# Patient Record
Sex: Male | Born: 1950
Health system: Southern US, Community
[De-identification: ages and names within clinical notes are randomized; demographics above are authoritative.]

## PROBLEM LIST (undated history)

## (undated) DIAGNOSIS — E860 Dehydration: Secondary | ICD-10-CM

## (undated) DIAGNOSIS — R000 Tachycardia, unspecified: Secondary | ICD-10-CM

## (undated) DIAGNOSIS — R634 Abnormal weight loss: Secondary | ICD-10-CM

## (undated) DIAGNOSIS — Z8673 Personal history of transient ischemic attack (TIA), and cerebral infarction without residual deficits: Secondary | ICD-10-CM

## (undated) DIAGNOSIS — Z9989 Dependence on other enabling machines and devices: Secondary | ICD-10-CM

## (undated) DIAGNOSIS — H269 Unspecified cataract: Secondary | ICD-10-CM

## (undated) DIAGNOSIS — H547 Unspecified visual loss: Secondary | ICD-10-CM

## (undated) DIAGNOSIS — I10 Essential (primary) hypertension: Secondary | ICD-10-CM

## (undated) DIAGNOSIS — I739 Peripheral vascular disease, unspecified: Secondary | ICD-10-CM

## (undated) DIAGNOSIS — I639 Cerebral infarction, unspecified: Secondary | ICD-10-CM

## (undated) DIAGNOSIS — D649 Anemia, unspecified: Secondary | ICD-10-CM

## (undated) DIAGNOSIS — D510 Vitamin B12 deficiency anemia due to intrinsic factor deficiency: Secondary | ICD-10-CM

## (undated) DIAGNOSIS — N189 Chronic kidney disease, unspecified: Secondary | ICD-10-CM

## (undated) DIAGNOSIS — H409 Unspecified glaucoma: Secondary | ICD-10-CM

## (undated) DIAGNOSIS — I1310 Hypertensive heart and chronic kidney disease without heart failure, with stage 1 through stage 4 chronic kidney disease, or unspecified chronic kidney disease: Secondary | ICD-10-CM

## (undated) DIAGNOSIS — D126 Benign neoplasm of colon, unspecified: Secondary | ICD-10-CM

## (undated) DIAGNOSIS — Z87898 Personal history of other specified conditions: Secondary | ICD-10-CM

## (undated) DIAGNOSIS — E119 Type 2 diabetes mellitus without complications: Secondary | ICD-10-CM

## (undated) DIAGNOSIS — Z9079 Acquired absence of other genital organ(s): Secondary | ICD-10-CM

## (undated) DIAGNOSIS — M1990 Unspecified osteoarthritis, unspecified site: Secondary | ICD-10-CM

## (undated) DIAGNOSIS — I214 Non-ST elevation (NSTEMI) myocardial infarction: Secondary | ICD-10-CM

## (undated) DIAGNOSIS — I509 Heart failure, unspecified: Secondary | ICD-10-CM

## (undated) DIAGNOSIS — H9190 Unspecified hearing loss, unspecified ear: Secondary | ICD-10-CM

## (undated) DIAGNOSIS — E785 Hyperlipidemia, unspecified: Secondary | ICD-10-CM

## (undated) HISTORY — DX: Unspecified cataract: H26.9

## (undated) HISTORY — DX: Benign neoplasm of colon, unspecified: D12.6

## (undated) HISTORY — PX: POLYPECTOMY: SHX149

## (undated) HISTORY — DX: Dehydration: E86.0

## (undated) HISTORY — PX: UPPER GASTROINTESTINAL ENDOSCOPY: SHX188

## (undated) HISTORY — DX: Unspecified glaucoma: H40.9

## (undated) HISTORY — PX: CATARACT EXTRACTION, BILATERAL: SHX1313

## (undated) HISTORY — DX: Unspecified osteoarthritis, unspecified site: M19.90

## (undated) HISTORY — PX: COLONOSCOPY: SHX174

## (undated) HISTORY — DX: Hypertensive heart and chronic kidney disease without heart failure, with stage 1 through stage 4 chronic kidney disease, or unspecified chronic kidney disease: I13.10

## (undated) HISTORY — DX: Anemia, unspecified: D64.9

## (undated) HISTORY — DX: Cerebral infarction, unspecified: I63.9

## (undated) HISTORY — DX: Acquired absence of other genital organ(s): Z90.79

## (undated) HISTORY — DX: Unspecified visual loss: H54.7

## (undated) HISTORY — DX: Non-ST elevation (NSTEMI) myocardial infarction: I21.4

## (undated) HISTORY — DX: Hyperlipidemia, unspecified: E78.5

## (undated) HISTORY — DX: Personal history of other specified conditions: Z87.898

## (undated) HISTORY — DX: Abnormal weight loss: R63.4

## (undated) HISTORY — DX: Tachycardia, unspecified: R00.0

## (undated) HISTORY — DX: Vitamin B12 deficiency anemia due to intrinsic factor deficiency: D51.0

---
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
## 2002-08-04 ENCOUNTER — Encounter: Payer: Self-pay | Admitting: Emergency Medicine

## 2002-08-05 ENCOUNTER — Encounter: Payer: Self-pay | Admitting: Emergency Medicine

## 2002-08-05 ENCOUNTER — Observation Stay (HOSPITAL_COMMUNITY): Admission: EM | Admit: 2002-08-05 | Discharge: 2002-08-06 | Payer: Self-pay | Admitting: Emergency Medicine

## 2002-08-20 ENCOUNTER — Encounter: Admission: RE | Admit: 2002-08-20 | Discharge: 2002-08-20 | Payer: Self-pay | Admitting: Family Medicine

## 2002-08-27 ENCOUNTER — Encounter: Admission: RE | Admit: 2002-08-27 | Discharge: 2002-08-27 | Payer: Self-pay | Admitting: Family Medicine

## 2002-10-14 ENCOUNTER — Encounter: Admission: RE | Admit: 2002-10-14 | Discharge: 2002-10-14 | Payer: Self-pay | Admitting: Family Medicine

## 2002-10-29 ENCOUNTER — Encounter: Admission: RE | Admit: 2002-10-29 | Discharge: 2002-10-29 | Payer: Self-pay | Admitting: Family Medicine

## 2002-11-03 ENCOUNTER — Encounter: Admission: RE | Admit: 2002-11-03 | Discharge: 2003-02-01 | Payer: Self-pay

## 2015-09-12 ENCOUNTER — Observation Stay (HOSPITAL_COMMUNITY): Payer: Self-pay

## 2015-09-12 ENCOUNTER — Inpatient Hospital Stay (HOSPITAL_COMMUNITY)
Admission: EM | Admit: 2015-09-12 | Discharge: 2015-09-14 | DRG: 066 | Disposition: A | Discharge status: Home / Self Care | Payer: Self-pay | Attending: Internal Medicine | Admitting: Internal Medicine

## 2015-09-12 ENCOUNTER — Emergency Department (HOSPITAL_COMMUNITY): Payer: Self-pay

## 2015-09-12 ENCOUNTER — Encounter (HOSPITAL_COMMUNITY): Payer: Self-pay | Admitting: Neurology

## 2015-09-12 DIAGNOSIS — R29700 NIHSS score 0: Secondary | ICD-10-CM | POA: Diagnosis present

## 2015-09-12 DIAGNOSIS — R2681 Unsteadiness on feet: Secondary | ICD-10-CM | POA: Diagnosis present

## 2015-09-12 DIAGNOSIS — I4510 Unspecified right bundle-branch block: Secondary | ICD-10-CM | POA: Diagnosis present

## 2015-09-12 DIAGNOSIS — I119 Hypertensive heart disease without heart failure: Secondary | ICD-10-CM | POA: Diagnosis present

## 2015-09-12 DIAGNOSIS — Z7982 Long term (current) use of aspirin: Secondary | ICD-10-CM

## 2015-09-12 DIAGNOSIS — R739 Hyperglycemia, unspecified: Secondary | ICD-10-CM

## 2015-09-12 DIAGNOSIS — I739 Peripheral vascular disease, unspecified: Secondary | ICD-10-CM | POA: Diagnosis present

## 2015-09-12 DIAGNOSIS — E7800 Pure hypercholesterolemia, unspecified: Secondary | ICD-10-CM

## 2015-09-12 DIAGNOSIS — I160 Hypertensive urgency: Secondary | ICD-10-CM | POA: Diagnosis present

## 2015-09-12 DIAGNOSIS — E860 Dehydration: Secondary | ICD-10-CM | POA: Diagnosis present

## 2015-09-12 DIAGNOSIS — R4781 Slurred speech: Secondary | ICD-10-CM | POA: Diagnosis present

## 2015-09-12 DIAGNOSIS — G459 Transient cerebral ischemic attack, unspecified: Secondary | ICD-10-CM

## 2015-09-12 DIAGNOSIS — Z23 Encounter for immunization: Secondary | ICD-10-CM

## 2015-09-12 DIAGNOSIS — H547 Unspecified visual loss: Secondary | ICD-10-CM | POA: Diagnosis present

## 2015-09-12 DIAGNOSIS — I10 Essential (primary) hypertension: Secondary | ICD-10-CM | POA: Diagnosis present

## 2015-09-12 DIAGNOSIS — G8324 Monoplegia of upper limb affecting left nondominant side: Secondary | ICD-10-CM | POA: Diagnosis present

## 2015-09-12 DIAGNOSIS — I639 Cerebral infarction, unspecified: Principal | ICD-10-CM | POA: Diagnosis present

## 2015-09-12 DIAGNOSIS — Z8673 Personal history of transient ischemic attack (TIA), and cerebral infarction without residual deficits: Secondary | ICD-10-CM | POA: Diagnosis present

## 2015-09-12 DIAGNOSIS — E1165 Type 2 diabetes mellitus with hyperglycemia: Secondary | ICD-10-CM | POA: Diagnosis present

## 2015-09-12 DIAGNOSIS — E11319 Type 2 diabetes mellitus with unspecified diabetic retinopathy without macular edema: Secondary | ICD-10-CM | POA: Diagnosis present

## 2015-09-12 DIAGNOSIS — E785 Hyperlipidemia, unspecified: Secondary | ICD-10-CM | POA: Diagnosis present

## 2015-09-12 HISTORY — DX: Type 2 diabetes mellitus without complications: E11.9

## 2015-09-12 HISTORY — DX: Essential (primary) hypertension: I10

## 2015-09-12 LAB — CBC
HCT: 43 % (ref 39.0–52.0)
Hemoglobin: 15.8 g/dL (ref 13.0–17.0)
MCH: 30.8 pg (ref 26.0–34.0)
MCHC: 36.7 g/dL — ABNORMAL HIGH (ref 30.0–36.0)
MCV: 83.8 fL (ref 78.0–100.0)
Platelets: 294 10*3/uL (ref 150–400)
RBC: 5.13 MIL/uL (ref 4.22–5.81)
RDW: 12.7 % (ref 11.5–15.5)
WBC: 8.5 10*3/uL (ref 4.0–10.5)

## 2015-09-12 LAB — I-STAT CHEM 8, ED
BUN: 13 mg/dL (ref 6–20)
Calcium, Ion: 1.2 mmol/L (ref 1.13–1.30)
Chloride: 96 mmol/L — ABNORMAL LOW (ref 101–111)
Creatinine, Ser: 0.9 mg/dL (ref 0.61–1.24)
Glucose, Bld: 388 mg/dL — ABNORMAL HIGH (ref 65–99)
HCT: 49 % (ref 39.0–52.0)
Hemoglobin: 16.7 g/dL (ref 13.0–17.0)
Potassium: 3.8 mmol/L (ref 3.5–5.1)
Sodium: 136 mmol/L (ref 135–145)
TCO2: 27 mmol/L (ref 0–100)

## 2015-09-12 LAB — LIPID PANEL
Cholesterol: 269 mg/dL — ABNORMAL HIGH (ref 0–200)
HDL: 62 mg/dL (ref >40)
LDL Cholesterol: 178 mg/dL — ABNORMAL HIGH (ref 0–99)
Total CHOL/HDL Ratio: 4.3 RATIO
Triglycerides: 145 mg/dL (ref <150)
VLDL: 29 mg/dL (ref 0–40)

## 2015-09-12 LAB — GLUCOSE, CAPILLARY
Glucose-Capillary: 261 mg/dL — ABNORMAL HIGH (ref 65–99)
Glucose-Capillary: 211 mg/dL — ABNORMAL HIGH (ref 65–99)
Glucose-Capillary: 178 mg/dL — ABNORMAL HIGH (ref 65–99)

## 2015-09-12 LAB — CBG MONITORING, ED
Glucose-Capillary: 342 mg/dL — ABNORMAL HIGH (ref 65–99)
Glucose-Capillary: 355 mg/dL — ABNORMAL HIGH (ref 65–99)
Glucose-Capillary: 272 mg/dL — ABNORMAL HIGH (ref 65–99)

## 2015-09-12 LAB — COMPREHENSIVE METABOLIC PANEL
ALT: 10 U/L — ABNORMAL LOW (ref 17–63)
AST: 13 U/L — ABNORMAL LOW (ref 15–41)
Albumin: 3.2 g/dL — ABNORMAL LOW (ref 3.5–5.0)
Alkaline Phosphatase: 66 U/L (ref 38–126)
Anion gap: 13 (ref 5–15)
BUN: 9 mg/dL (ref 6–20)
CO2: 26 mmol/L (ref 22–32)
Calcium: 9.5 mg/dL (ref 8.9–10.3)
Chloride: 99 mmol/L — ABNORMAL LOW (ref 101–111)
Creatinine, Ser: 1.1 mg/dL (ref 0.61–1.24)
GFR calc Af Amer: >60 mL/min (ref >60)
GFR calc non Af Amer: >60 mL/min (ref >60)
Glucose, Bld: 402 mg/dL — ABNORMAL HIGH (ref 65–99)
Potassium: 3.9 mmol/L (ref 3.5–5.1)
Sodium: 138 mmol/L (ref 135–145)
Total Bilirubin: 1 mg/dL (ref 0.3–1.2)
Total Protein: 6.4 g/dL — ABNORMAL LOW (ref 6.5–8.1)

## 2015-09-12 LAB — DIFFERENTIAL
Basophils Absolute: 0 10*3/uL (ref 0.0–0.1)
Basophils Relative: 0 %
Eosinophils Absolute: 0 10*3/uL (ref 0.0–0.7)
Eosinophils Relative: 0 %
Lymphocytes Relative: 32 %
Lymphs Abs: 2.7 10*3/uL (ref 0.7–4.0)
Monocytes Absolute: 0.3 10*3/uL (ref 0.1–1.0)
Monocytes Relative: 3 %
Neutro Abs: 5.4 10*3/uL (ref 1.7–7.7)
Neutrophils Relative %: 64 %

## 2015-09-12 LAB — APTT: aPTT: 25 seconds (ref 24–37)

## 2015-09-12 LAB — PROTIME-INR
INR: 1.27 (ref 0.00–1.49)
Prothrombin Time: 16.1 seconds — ABNORMAL HIGH (ref 11.6–15.2)

## 2015-09-12 LAB — I-STAT TROPONIN, ED: Troponin i, poc: 0 ng/mL (ref 0.00–0.08)

## 2015-09-12 MED ORDER — PNEUMOCOCCAL VAC POLYVALENT 25 MCG/0.5ML IJ INJ
0.5000 mL | INJECTION | INTRAMUSCULAR | Status: AC
Start: 2015-09-13 — End: 2015-09-13
  Administered 2015-09-13: 0.5 mL via INTRAMUSCULAR
  Filled 2015-09-12: qty 0.5

## 2015-09-12 MED ORDER — LABETALOL HCL 5 MG/ML IV SOLN: 10.0000 mg | INTRAVENOUS | Status: DC | PRN

## 2015-09-12 MED ORDER — INSULIN ASPART 100 UNIT/ML ~~LOC~~ SOLN
0.0000 [IU] | Freq: Three times a day (TID) | SUBCUTANEOUS | Status: DC
  Administered 2015-09-13: 5 [IU] via SUBCUTANEOUS
  Administered 2015-09-13: 8 [IU] via SUBCUTANEOUS
  Administered 2015-09-13: 3 [IU] via SUBCUTANEOUS
  Administered 2015-09-14: 5 [IU] via SUBCUTANEOUS

## 2015-09-12 MED ORDER — INSULIN ASPART 100 UNIT/ML ~~LOC~~ SOLN
0.0000 [IU] | SUBCUTANEOUS | Status: DC
  Administered 2015-09-12: 8 [IU] via SUBCUTANEOUS
  Filled 2015-09-12: qty 1

## 2015-09-12 MED ORDER — STROKE: EARLY STAGES OF RECOVERY BOOK
Freq: Once | Status: AC
  Administered 2015-09-13: 1

## 2015-09-12 MED ORDER — ASPIRIN 81 MG PO CHEW
81.0000 mg | CHEWABLE_TABLET | Freq: Every day | ORAL | Status: DC
  Administered 2015-09-12 – 2015-09-14 (×3): 81 mg via ORAL
  Filled 2015-09-12 (×3): qty 1

## 2015-09-12 MED ORDER — SENNOSIDES-DOCUSATE SODIUM 8.6-50 MG PO TABS
1.0000 | ORAL_TABLET | Freq: Every evening | ORAL | Status: DC | PRN
Start: 2015-09-12 — End: 2015-09-14

## 2015-09-12 MED ORDER — LABETALOL HCL 5 MG/ML IV SOLN
20.0000 mg | Freq: Once | INTRAVENOUS | Status: AC
  Administered 2015-09-12: 20 mg via INTRAVENOUS

## 2015-09-12 MED ORDER — INFLUENZA VAC SPLIT QUAD 0.5 ML IM SUSY
0.5000 mL | PREFILLED_SYRINGE | INTRAMUSCULAR | Status: AC
  Administered 2015-09-13: 0.5 mL via INTRAMUSCULAR
  Filled 2015-09-12: qty 0.5

## 2015-09-12 MED ORDER — INSULIN ASPART 100 UNIT/ML ~~LOC~~ SOLN: 0.0000 [IU] | Freq: Every day | SUBCUTANEOUS | Status: DC

## 2015-09-12 MED ORDER — ACETAMINOPHEN 325 MG PO TABS: 650.0000 mg | ORAL_TABLET | Freq: Four times a day (QID) | ORAL | Status: DC | PRN

## 2015-09-12 MED ORDER — ENOXAPARIN SODIUM 40 MG/0.4ML ~~LOC~~ SOLN
40.0000 mg | SUBCUTANEOUS | Status: DC
  Administered 2015-09-12 – 2015-09-13 (×2): 40 mg via SUBCUTANEOUS
  Filled 2015-09-12 (×2): qty 0.4

## 2015-09-12 MED ORDER — SODIUM CHLORIDE 0.9 % IV BOLUS (SEPSIS)
1000.0000 mL | Freq: Once | INTRAVENOUS | Status: AC
  Administered 2015-09-12: 1000 mL via INTRAVENOUS

## 2015-09-12 NOTE — ED Notes (Signed)
Pt's CBG result was 342. Informed Hassan Rowan - RN.

## 2015-09-12 NOTE — Consult Note (Signed)
Requesting Physician: ED MD    Chief Complaint: Code stroke  HPI:                                                                                                                                         Dennis Macias is an 65 y.o. male who has Hx DM and was with his family. At 1310 he noted sudden onset of difficulty forming his words and some imbalance.  HE was brought to [facility] as code stroke. BP 230/120 but symptoms had resolved. EKG showing possible ST elevation. CT head negative for stroke. Patient placed on TIA alert and given 20 mg Labetalol, his blood pressure in the ED was 223/107. He does have decreased vision in his right eye but this is old. He did not experience focal weakness no numbness involving face or extremities.  Date last known well: Today Time last known well: Time: 13:10 tPA Given: No: symptoms resolved    Past Medical History  Diagnosis Date  . Diabetes ([place])     No past surgical history on file.  Family History  Problem Relation Age of Onset  . Hypertension Mother   . Hyperlipidemia Mother   . Hyperlipidemia Father   . Hyperlipidemia Father    Social History:  has no tobacco, alcohol, and drug history on file.  Allergies: Allergies not on file  Medications:                                                                                                                           No current facility-administered medications for this encounter.   No current outpatient prescriptions on file.     ROS:  History obtained from the patient  General ROS: negative for - chills, fatigue, fever, night sweats, weight gain or weight loss Psychological ROS: negative for - behavioral disorder, hallucinations, memory difficulties, mood swings or suicidal ideation Ophthalmic ROS: negative for - blurry vision, double vision, eye pain or loss of  vision ENT ROS: negative for - epistaxis, nasal discharge, oral lesions, sore throat, tinnitus or vertigo Allergy and Immunology ROS: negative for - hives or itchy/watery eyes Hematological and Lymphatic ROS: negative for - bleeding problems, bruising or swollen lymph nodes Endocrine ROS: negative for - galactorrhea, hair pattern changes, polydipsia/polyuria or temperature intolerance Respiratory ROS: negative for - cough, hemoptysis, shortness of breath or wheezing Cardiovascular ROS: negative for - chest pain, dyspnea on exertion, edema or irregular heartbeat Gastrointestinal ROS: negative for - abdominal pain, diarrhea, hematemesis, nausea/vomiting or stool incontinence Genito-Urinary ROS: negative for - dysuria, hematuria, incontinence or urinary frequency/urgency Musculoskeletal ROS: negative for - joint swelling or muscular weakness Neurological ROS: as noted in HPI Dermatological ROS: negative for rash and skin lesion changes  Neurologic Examination:                                                                                                      Blood pressure 233/107, pulse 106, temperature 98.6 F (37 C), temperature source Oral, resp. rate 12, height 6' (1.829 m), weight 80.8 kg (178 lb 2.1 oz), SpO2 99 %.  HEENT-  Normocephalic, no lesions, without obvious abnormality.  Normal external eye and conjunctiva.  Normal TM's bilaterally.  Normal auditory canals and external ears. Normal external nose, mucus membranes and septum.  Normal pharynx. Cardiovascular- S1, S2 normal, pulses palpable throughout   Lungs- chest clear, no wheezing, rales, normal symmetric air entry Abdomen- normal findings: bowel sounds normal Extremities- no edema Lymph-no adenopathy palpable Musculoskeletal-no joint tenderness, deformity or swelling Skin-warm and dry, no hyperpigmentation, vitiligo, or suspicious lesions  Neurological Examination Mental Status: Alert, oriented, thought content appropriate.   Speech fluent with mild slurring to dentulous, without evidence of aphasia.  Able to follow 3 step commands without difficulty. Cranial Nerves: II:  Visual fields grossly normal with decreased visual acuity in the right eye, pupils equal, round, reactive to light and accommodation III,IV, VI: ptosis not present, extra-ocular motions intact bilaterally V,VII: smile symmetric, facial light touch sensation normal bilaterally VIII: hearing normal bilaterally IX,X: uvula rises symmetrically XI: bilateral shoulder shrug XII: midline tongue extension Motor: Right : Upper extremity   5/5    Left:     Upper extremity   5/5  Lower extremity   5/5     Lower extremity   5/5 Tone and bulk:normal tone throughout; no atrophy noted Sensory: Pinprick and light touch intact throughout, bilaterally Deep Tendon Reflexes: 2+ and symmetric throughout Plantars: Right: downgoing   Left: downgoing Cerebellar: normal finger-to-nose,and normal heel-to-shin test Gait: not tested       Lab Results: Basic Metabolic Panel:  Recent Labs Lab 09/12/15 1356  NA 136  K 3.8  CL 96*  GLUCOSE 388*  BUN 13  CREATININE 0.90    Liver  Function Tests: No results for input(s): AST, ALT, ALKPHOS, BILITOT, PROT, ALBUMIN in the last 168 hours. No results for input(s): LIPASE, AMYLASE in the last 168 hours. No results for input(s): AMMONIA in the last 168 hours.  CBC:  Recent Labs Lab 09/12/15 1347 09/12/15 1356  WBC 8.5  --   NEUTROABS 5.4  --   HGB 15.8 16.7  HCT 43.0 49.0  MCV 83.8  --   PLT 294  --     Cardiac Enzymes: No results for input(s): CKTOTAL, CKMB, CKMBINDEX, TROPONINI in the last 168 hours.  Lipid Panel: No results for input(s): CHOL, TRIG, HDL, CHOLHDL, VLDL, LDLCALC in the last 168 hours.  CBG:  Recent Labs Lab 09/12/15 [place]*    Microbiology: No results found for this or any previous visit.  Coagulation Studies:  Recent Labs  09/12/15 1347  LABPROT 16.1*   INR 1.27    Imaging: Ct Head Wo Contrast  09/12/2015  CLINICAL DATA:  Slurred speech, unsteady gait EXAM: CT HEAD WITHOUT CONTRAST TECHNIQUE: Contiguous axial images were obtained from the base of the skull through the vertex without intravenous contrast. COMPARISON:  None. FINDINGS: No parenchymal hemorrhage or extra-axial fluid. Mild age-related atrophy and low attenuation in the deep white matter. New no evidence of mass or vascular territory infarct. No hydrocephalus. IMPRESSION: Mild age-related involutional change with no acute findings. Electronically Signed   By: Skipper Cliche M.D.   On: 09/12/2015 14:00    Etta Quill PA-C [facility] [id number]  09/12/2015, 2:11 PM   Assessment: 65 y.o. male with multiple risk factors for stroke presenting with probable transient ischemic attack manifested by transient expressive aphasia.  Stroke Risk Factors - diabetes mellitus and hypertension  Plan: 1. HgbA1c, fasting lipid panel 2. MRI, MRA  of the brain without contrast 3. PT consult, OT consult, Speech consult 4. Echocardiogram 5. Carotid dopplers 6. Prophylactic therapy-Antiplatelet med: Aspirin  7. Risk  Factor modification  I personally participated in this patient's evaluation and management, including formulating the above clinical assessment and management recommendations.  Rush Farmer M.D. [facility] [phone]

## 2015-09-12 NOTE — Progress Notes (Signed)
Pt arrived to 5M09 via stretcher.  Pt ambulated from stretcher to bathroom then to bed without difficulty.  Pt alert and oriented, conversant, daughter at bedside.  Telemetry applied and CCMD notified.  Will continue to monitor. Cori Razor, RN

## 2015-09-12 NOTE — H&P (Signed)
Date: 09/12/2015               Patient Name:  Dennis Macias MRN: [id number]  DOB: 1950/08/31 Age / Sex: 65 y.o., male   PCP: No primary care provider on file.         Medical Service: Internal Medicine Teaching Service         Attending Physician: Dr. Oval Linsey, MD    First Contact: Dr. Zada Finders Pager: [phone]  Second Contact: Dr. Jacques Earthly Pager: [phone]       After Hours (After 5p/  First Contact Pager: [phone]  weekends / holidays): Second Contact Pager: [phone]   Chief Complaint: Slurred speech  History of Present Illness:  Dennis Macias is a 65 year old male with PMH of T2DM who presents with slurred speech. Patient states he was at his usual state of health, playing with his grandkids, when he had sudden onset of slurred speech and difficulty getting his words out. His family reports seeing left sided facial droop at his mouth and weakness of his left upper extremity with inability to grab a can. He says he did not try to walk, so is unsure if he had ataxic gait. This began around 1 PM and lasted approximately 5-10 minutes before returning to his baseline health. He has not taken any medication for diabetes for several years now or have a regular physician. Patient denied any chest pain, palpitations, N/V/D/C, diaphoresis, change in vision, headache, SOB, fall, loss of consciousness, or seizure-like activity.  EMS were called and his BP was 180/100. In the ED his blood pressure was 230/120, CT head was negative for acute stroke or hemorrhage. He was given Labetalol 20 mg with subsequent BP of 181/99.   Meds: Current Facility-Administered Medications  Medication Dose Route Frequency Provider Last Rate Last Dose  .  stroke: mapping our early stages of recovery book   Does not apply Once Milagros Loll, MD      . acetaminophen (TYLENOL) tablet 650 mg  650 mg Oral Q6H PRN Milagros Loll, MD      . aspirin chewable tablet 81 mg  81 mg Oral Daily Milagros Loll, MD       . enoxaparin (LOVENOX) injection 40 mg  40 mg Subcutaneous Q24H Milagros Loll, MD      . Derrill Memo ON 09/13/2015] insulin aspart (novoLOG) injection 0-15 Units  0-15 Units Subcutaneous TID WC Milagros Loll, MD      . insulin aspart (novoLOG) injection 0-5 Units  0-5 Units Subcutaneous QHS Milagros Loll, MD      . labetalol (NORMODYNE,TRANDATE) injection 10 mg  10 mg Intravenous Q4H PRN Milagros Loll, MD      . senna-docusate (Senokot-S) tablet 1 tablet  1 tablet Oral QHS PRN Milagros Loll, MD        Allergies: Allergies as of 09/12/2015  . (No Known Allergies)   Past Medical History  Diagnosis Date  . Diabetes ([place])   . Hypertension    History reviewed. No pertinent past surgical history. Family History  Problem Relation Age of Onset  . Hypertension Mother   . Hyperlipidemia Mother   . Hyperlipidemia Father   . Hyperlipidemia Father    Social History   Social History  . Marital Status: Married    Spouse Name: N/A  . Number of Children: N/A  . Years of Education: N/A   Occupational History  . Not on file.   Social  History Main Topics  . Smoking status: Never Smoker   . Smokeless tobacco: Current User    Types: Chew  . Alcohol Use: 25.2 oz/week    42 Cans of beer per week     Comment: 1 6 pack per day  . Drug Use: No  . Sexual Activity: Not on file   Other Topics Concern  . Not on file   Social History Narrative  . No narrative on file    Review of Systems: Review of Systems  Constitutional: Negative for fever, chills and diaphoresis.  Eyes: Negative for blurred vision.  Respiratory: Negative for cough, shortness of breath and wheezing.   Cardiovascular: Negative for chest pain, palpitations and leg swelling.  Gastrointestinal: Negative for nausea, vomiting, abdominal pain, diarrhea, constipation and blood in stool.  Genitourinary: Negative for dysuria.  Musculoskeletal: Negative for myalgias, joint pain and falls.  Neurological: Positive for  speech change, focal weakness and weakness. Negative for dizziness, tingling, tremors, sensory change, seizures, loss of consciousness and headaches.  Psychiatric/Behavioral: Negative for substance abuse.     Physical Exam: Blood pressure 192/93, pulse 71, temperature 98.1 F (36.7 C), temperature source Oral, resp. rate 18, height 6' (1.829 m), weight 178 lb 2.1 oz (80.8 kg), SpO2 100 %. Physical Exam  Constitutional: He is oriented to person, place, and time. He appears well-developed and well-nourished. No distress.  HENT:  Head: Normocephalic and atraumatic.  Mouth/Throat: Oropharynx is clear and moist.  Eyes: EOM are normal. Pupils are equal, round, and reactive to light.  Cardiovascular: Normal rate, regular rhythm and intact distal pulses.  Exam reveals no gallop and no friction rub.   No murmur heard. Pulmonary/Chest: Effort normal. No respiratory distress. He has no wheezes. He has no rales.  Abdominal: Soft. Bowel sounds are normal. He exhibits no distension. There is no tenderness.  Musculoskeletal: Normal range of motion. He exhibits no edema or tenderness.  Neurological: He is alert and oriented to person, place, and time. He has normal strength. He displays no tremor. No cranial nerve deficit or sensory deficit. Coordination normal.  Reflex Scores:      Bicep reflexes are 2+ on the right side and 2+ on the left side.      Patellar reflexes are 2+ on the right side and 2+ on the left side. Skin: Skin is warm and dry. He is not diaphoretic.  Psychiatric: He has a normal mood and affect.     Lab results: Basic Metabolic Panel:  Recent Labs  09/12/15 1347 09/12/15 1356  NA 138 136  K 3.9 3.8  CL 99* 96*  CO2 26  --   GLUCOSE 402* 388*  BUN 9 13  CREATININE 1.10 0.90  CALCIUM 9.5  --    Liver Function Tests:  Recent Labs  09/12/15 1347  AST 13*  ALT 10*  ALKPHOS 66  BILITOT 1.0  PROT 6.4*  ALBUMIN 3.2*   No results for input(s): LIPASE, AMYLASE in the  last 72 hours. No results for input(s): AMMONIA in the last 72 hours. CBC:  Recent Labs  09/12/15 1347 09/12/15 1356  WBC 8.5  --   NEUTROABS 5.4  --   HGB 15.8 16.7  HCT 43.0 49.0  MCV 83.8  --   PLT 294  --    Cardiac Enzymes: No results for input(s): CKTOTAL, CKMB, CKMBINDEX, TROPONINI in the last 72 hours. BNP: No results for input(s): PROBNP in the last 72 hours. D-Dimer: No results for input(s): DDIMER in the  last 72 hours. CBG:  Recent Labs  09/12/15 1407 09/12/15 1626 09/12/15 1735 09/12/15 1821  GLUCAP 342* 355* 272* 261*   Hemoglobin A1C: No results for input(s): HGBA1C in the last 72 hours. Fasting Lipid Panel:  Recent Labs  09/12/15 1828  CHOL 269*  HDL 62  LDLCALC 178*  TRIG 145  CHOLHDL 4.3   Thyroid Function Tests: No results for input(s): TSH, T4TOTAL, FREET4, T3FREE, THYROIDAB in the last 72 hours. Anemia Panel: No results for input(s): VITAMINB12, FOLATE, FERRITIN, TIBC, IRON, RETICCTPCT in the last 72 hours. Coagulation:  Recent Labs  09/12/15 1347  LABPROT 16.1*  INR 1.27   Urine Drug Screen: Drugs of Abuse  No results found for: LABOPIA, COCAINSCRNUR, LABBENZ, AMPHETMU, THCU, LABBARB  Alcohol Level: No results for input(s): ETH in the last 72 hours. Urinalysis: No results for input(s): COLORURINE, LABSPEC, PHURINE, GLUCOSEU, HGBUR, BILIRUBINUR, KETONESUR, PROTEINUR, UROBILINOGEN, NITRITE, LEUKOCYTESUR in the last 72 hours.  Invalid input(s): APPERANCEUR   Imaging results:  Ct Head Wo Contrast  09/12/2015  ADDENDUM REPORT: 09/12/2015 18:03 ADDENDUM: Critical Value/emergent results were called by telephone at the time of interpretation on 09/12/2015 at 2:01 Pm to Dr. Nicole Kindred , who verbally acknowledged these results. Electronically Signed   By: Skipper Cliche M.D.   On: 09/12/2015 18:03  09/12/2015  CLINICAL DATA:  Slurred speech, unsteady gait EXAM: CT HEAD WITHOUT CONTRAST TECHNIQUE: Contiguous axial images were obtained from the  base of the skull through the vertex without intravenous contrast. COMPARISON:  None. FINDINGS: No parenchymal hemorrhage or extra-axial fluid. Mild age-related atrophy and low attenuation in the deep white matter. New no evidence of mass or vascular territory infarct. No hydrocephalus. IMPRESSION: Mild age-related involutional change with no acute findings. Electronically Signed: By: Skipper Cliche M.D. On: 09/12/2015 14:00    Other results: EKG: sinus tachycardia, rate 108, RBBB, possible ST elevation inferolateral leads  Assessment & Plan by Problem: Active Problems:   TIA (transient ischemic attack)   Hyperglycemia   Hypertensive urgency  TIA: Patient with slurred speech, left facial droop, and LUE weakness which resolved after 5-10 minutes. CT Head negative for acute changes. Patient likely had TIA in setting of uncontrolled diabetes and hypertension. Will need risk factor modification for secondary prevention and establishment with a PCP. -Neurology following -MRI/MRA brain without contrast -Permissive HTN up to 210/110 -Aspirin 81 mg -Check Hgb A1C, Lipid panel -TTE -Carotid dopplers -PT/OT -Stroke swallow screen  RBBB: Patient with apparent new RBBB compared to tracing in 2004. He denies any chest pain, palpitations, or syncope. -Repeat EKG   T2DM: Patient with history of diabetes, has not taken medication for several years. -SSI-M with HS coverage  HTN: Will need antihypertensive regimen -Permissive HTN as above for now   Diet: Heart  DVT ppx: Lovenox  Code: FULL    Dispo: Disposition is deferred at this time, awaiting improvement of current medical problems. Anticipated discharge in approximately 1-2 day(s).   The patient does not have a current PCP (No primary care provider on file.) and does need an [facility] hospital follow-up appointment after discharge.  The patient does not have transportation limitations that hinder transportation to clinic  appointments.  Signed: Zada Finders, MD 09/12/2015, 7:55 PM

## 2015-09-12 NOTE — ED Notes (Signed)
Dr Randell Patient paged and responded RE pt blood sugar and bp

## 2015-09-12 NOTE — Code Documentation (Signed)
65yo male arriving to [facility] via [facility] at 49.  EMS reports that the patient had sudden onset difficulty getting his words out at 1310.  EMS assessed slurred speech and unsteady gait and activated a code stroke.  Patient hypertensive at 180/100 and CBG 336.  Stroke team at the bedside on patient arrival.  Labs drawn and patient cleared for CT by Dr. Billy Fischer.  Patient to CT then to Trauma A.  NIHSS 0, see documentation for details and code stroke times.  Patient hypertensive with BP 250/137, Labetalol 20mg  IVP ordered.  Dr. Nicole Kindred at the bedside.  No acute stroke treatment at this time.  TIA alert.  Bedside handoff with ED RN Hassan Rowan.

## 2015-09-12 NOTE — ED Provider Notes (Signed)
CSN: [id number]     Arrival date & time 09/12/15  1345 History   First MD Initiated Contact with Patient 09/12/15 1347     Chief Complaint  Patient presents with  . Code Stroke     (Consider location/radiation/quality/duration/timing/severity/associated sxs/prior Treatment) HPI   Dennis Macias Is a 65 year old male with no known past medical history who does not follow regularly with the physician.At 1:30 PM he had the sudden onset of slurred speech and difficulty with walking. He was speaking with his daughter at the time that this occurred. They immediately called 911. The patient's symptoms resolved prior to arriving in the emergency department. He has a past medical history is of diabetes but does not take any medications or see a physician. 4. Upon arrival he was found to have an initial blood pressure of 180/100. Code stroke was initiated. CT scan of the brain is negative. He denies any chest pain, shortness of breath, diaphoresis. Patient was seen in shared visit with her neurologist, Dr. Nicole Kindred and his physician assistant. Patient current NIH scale of 0. He has an old right. I vision deficit, but no hemianopsia or changes in vision. Patient given IV labetalol. We will attempt to control his pressures. This appears to be a hypertensive emergency.Patient denies alcohol abuse.  No past medical history on file. No past surgical history on file. No family history on file. Social History  Substance Use Topics  . Smoking status: Not on file  . Smokeless tobacco: Not on file  . Alcohol Use: Not on file    Review of Systems  Ten systems reviewed and are negative for acute change, except as noted in the HPI.    Allergies  Review of patient's allergies indicates not on file.  Home Medications   Prior to Admission medications   Not on File   BP 250/137 mmHg  Pulse 99  Temp(Src) 98.6 F (37 C) (Oral)  Resp 14  Ht 6' (1.829 m)  Wt 80.8 kg  BMI 24.15 kg/m2  SpO2 98% Physical Exam   Constitutional: He appears well-developed and well-nourished. No distress.  HENT:  Head: Normocephalic and atraumatic.  Eyes: Conjunctivae are normal. No scleral icterus.  Neck: Normal range of motion. Neck supple.  Cardiovascular: Normal rate, regular rhythm and normal heart sounds.   Pulmonary/Chest: Effort normal and breath sounds normal. No respiratory distress.  Abdominal: Soft. There is no tenderness.  Musculoskeletal: He exhibits no edema.  Neurological: He is alert.  Speech is clear and goal oriented, follows commands Major Cranial nerves without deficit, no facial droop Normal strength in upper and lower extremities bilaterally including dorsiflexion and plantar flexion, strong and equal grip strength Sensation normal to light and sharp touch Moves extremities without ataxia, coordination intact Tremulous movements Normal finger to nose and rapid alternating movements Neg romberg, no pronator drift    Skin: Skin is warm and dry. He is not diaphoretic.  Psychiatric: His behavior is normal.  Nursing note and vitals reviewed.   ED Course  Procedures (including critical care time) Labs Review Labs Reviewed  CBC - Abnormal; Notable for the following:    MCHC 36.7 (*)    All other components within normal limits  CBG MONITORING, ED - Abnormal; Notable for the following:    Glucose-Capillary 342 (*)    All other components within normal limits  I-STAT CHEM 8, ED - Abnormal; Notable for the following:    Chloride 96 (*)    Glucose, Bld 388 (*)  All other components within normal limits  DIFFERENTIAL  PROTIME-INR  APTT  COMPREHENSIVE METABOLIC PANEL  I-STAT TROPOININ, ED    Imaging Review Ct Head Wo Contrast  09/12/2015  CLINICAL DATA:  Slurred speech, unsteady gait EXAM: CT HEAD WITHOUT CONTRAST TECHNIQUE: Contiguous axial images were obtained from the base of the skull through the vertex without intravenous contrast. COMPARISON:  None. FINDINGS: No parenchymal  hemorrhage or extra-axial fluid. Mild age-related atrophy and low attenuation in the deep white matter. New no evidence of mass or vascular territory infarct. No hydrocephalus. IMPRESSION: Mild age-related involutional change with no acute findings. Electronically Signed   By: Skipper Cliche M.D.   On: 09/12/2015 14:00   I have personally reviewed and evaluated these images and lab results as part of my medical decision-making.   EKG Interpretation None      MDM   Final diagnoses:  Hypertensive urgency  Transient cerebral ischemia, unspecified transient cerebral ischemia type  Hyperglycemia   3:34 PM Filed Vitals:   09/12/15 1430 09/12/15 1445 09/12/15 1500 09/12/15 1512  BP: 175/103 174/98 168/93   Pulse: 77 81 74   Temp:    98.6 F (37 C)  TempSrc:      Resp: 18 17 24    Height:      Weight:      SpO2: 98% 99% 97%     Patient with hyperglycemia. Hypertension improving with IV labetalol 20mg . He will be admitted by the internal medicine teaching service.  No return of TIA like sxs.  Neurology will consult.     Margarita Mail, PA-C 09/12/15 [place], MD 09/12/15 2159

## 2015-09-12 NOTE — ED Notes (Signed)
Pt LSN at 1310 by daughter when he suddenly began slurring his speech and stating he was having difficulty walking.  AO x 4.  CBG in 300's.  BP 180/100.

## 2015-09-13 ENCOUNTER — Ambulatory Visit (HOSPITAL_COMMUNITY): Payer: Self-pay

## 2015-09-13 DIAGNOSIS — Z8673 Personal history of transient ischemic attack (TIA), and cerebral infarction without residual deficits: Secondary | ICD-10-CM

## 2015-09-13 DIAGNOSIS — I639 Cerebral infarction, unspecified: Principal | ICD-10-CM

## 2015-09-13 DIAGNOSIS — G459 Transient cerebral ischemic attack, unspecified: Secondary | ICD-10-CM

## 2015-09-13 DIAGNOSIS — E785 Hyperlipidemia, unspecified: Secondary | ICD-10-CM | POA: Diagnosis present

## 2015-09-13 DIAGNOSIS — I10 Essential (primary) hypertension: Secondary | ICD-10-CM

## 2015-09-13 DIAGNOSIS — E1165 Type 2 diabetes mellitus with hyperglycemia: Secondary | ICD-10-CM

## 2015-09-13 DIAGNOSIS — I4510 Unspecified right bundle-branch block: Secondary | ICD-10-CM

## 2015-09-13 HISTORY — DX: Personal history of transient ischemic attack (TIA), and cerebral infarction without residual deficits: Z86.73

## 2015-09-13 LAB — LIPID PANEL
Cholesterol: 234 mg/dL — ABNORMAL HIGH (ref 0–200)
HDL: 49 mg/dL (ref >40)
LDL Cholesterol: 148 mg/dL — ABNORMAL HIGH (ref 0–99)
Total CHOL/HDL Ratio: 4.8 RATIO
Triglycerides: 183 mg/dL — ABNORMAL HIGH (ref <150)
VLDL: 37 mg/dL (ref 0–40)

## 2015-09-13 LAB — GLUCOSE, CAPILLARY
Glucose-Capillary: 223 mg/dL — ABNORMAL HIGH (ref 65–99)
Glucose-Capillary: 253 mg/dL — ABNORMAL HIGH (ref 65–99)
Glucose-Capillary: 173 mg/dL — ABNORMAL HIGH (ref 65–99)
Glucose-Capillary: 257 mg/dL — ABNORMAL HIGH (ref 65–99)

## 2015-09-13 LAB — HEMOGLOBIN A1C
Hgb A1c MFr Bld: 10.7 % — ABNORMAL HIGH (ref 4.8–5.6)
Mean Plasma Glucose: 260 mg/dL

## 2015-09-13 MED ORDER — ATORVASTATIN CALCIUM 80 MG PO TABS
80.0000 mg | ORAL_TABLET | Freq: Every day | ORAL | Status: DC
  Administered 2015-09-13: 80 mg via ORAL
  Filled 2015-09-13: qty 1

## 2015-09-13 NOTE — Progress Notes (Signed)
Inpatient Diabetes Program Recommendations  AACE/ADA: New Consensus Statement on Inpatient Glycemic Control (2015)  Target Ranges:  Prepandial:   less than 140 mg/dL      Peak postprandial:   less than 180 mg/dL (1-2 hours)      Critically ill patients:  140 - 180 mg/dL   Spoke with patient about diabetes. Patient reports being first diagnosed with DM 15 years ago. Patient lost a lot of weight and was able to control his glucose without medical intervention. Inquired about prior A1C knowledge. Patient reports it sounds familiar. Went over A1c goals and patient's current A1c (10.7% on 09/12/15) and explained that his current A1C indicates an average glucose of 250's mg/dl over the past 2-3 months. Also covered glucose goals. Discussed importance of checking CBGs and maintaining good CBG control to prevent long-term and short-term complications. Requested Patient check glucose 2 times a day on possible oral medications. Stressed to the patient the importance of improving glycemic control to prevent further complications from uncontrolled diabetes. Discussed impact of nutrition, exercise, stress, sickness, and medications on diabetes control. Discussed carbohydrates, carbohydrate goals per day and meal, along with portion sizes. Encouraged patient to keep a log book of glucose readings and insulin taken which he will need to take to doctor appointments. Explained how the doctor he follows up with can use the log book to continue to make insulin adjustments if needed. Spoke with patient about the need of him being on medication at this time with his A1c. Discussed s/s of hypoglycemia and treatment.  Patient verbalized understanding of information discussed and he states that he has no further questions at this time related to diabetes.  Thanks, Tama Headings RN, MSN, [facility] Inpatient Diabetes Coordinator Team Pager [phone] (8a-5p)

## 2015-09-13 NOTE — Progress Notes (Signed)
   Subjective: Patient feels back to his baseline, no events overnight.  Objective: Vital signs in last 24 hours: Filed Vitals:   09/13/15 0500 09/13/15 0558 09/13/15 1024 09/13/15 1451  BP:  209/101 188/100 156/98  Pulse:  76 80 79  Temp:  98.7 F (37.1 C)  98.3 F (36.8 C)  TempSrc:  Oral Oral Oral  Resp: 16 16 18 18   Height:      Weight:      SpO2: 99% 99% 99% 99%   Weight change:  No intake or output data in the 24 hours ending 09/13/15 1636 General: resting in bed, no acute distress HEENT: EOMI, no scleral icterus Cardiac: RRR, no rubs, murmurs or gallops Pulm: clear to auscultation bilaterally, moving normal volumes of air Abd: soft, nontender, nondistended, BS present Ext: warm and well perfused, no pedal edema Neuro: alert and oriented X3, cranial nerves II-XII grossly intact, mild dysmetria of left hand on finger to nose, otherwise neuro exam intact   Assessment/Plan: Principal Problem:   Acute CVA (cerebrovascular accident) ([place]) Active Problems:   Uncontrolled type 2 diabetes mellitus ([place])   Hypertensive urgency   Hyperlipidemia  Acute CVA: MRI of brain with two small acute infarcts right mid to posterior frontal lobe bordering the parietal lobe in a parasaggital distribution. Also showing small acute infarct of the anterior medial left frontal lobe. Possibly secondary to embolic process or small vessel disease. Patient's symptoms of left facial droop, slurred speech, and LUE weakness have resolved. Will need risk factor modification and control of hypertension, diabetes, hyperlipidemia. TTE read pending. -Neurology following -f/u TTE read -> TEE tomorrow if negative -Permissive HTN <220/120 -Continue Aspirin 81 mg -Atorvastatin 80 mg -Lifestyle modifications -f/u in [facility]  T2DM: Hgb A1C 10.7 -SSI-M -Add Metformin on discharge  HTN: Allow permissive hypertension <220/120 for now, then slowly titrate medical therapy for BP control  HLD: -Atorvastatin 80  mg  RBBB: Patient with apparent new RBBB compared to tracing in 2004. He is asymptomatic. -Continue to monitor  Dispo:  Anticipated discharge in approximately 1 day(s).    LOS: 0 days   Zada Finders, MD 09/13/2015, 4:36 PM

## 2015-09-13 NOTE — Evaluation (Addendum)
Occupational Therapy Evaluation Patient Details Name: Dennis Macias MRN: [id number] DOB: 10-Apr-1951 Today's Date: 09/13/2015    History of Present Illness 65 y.o. admitted for stroke like symptoms. MRI revealed Two small acute infarcts right mid to posterior frontal lobe (bordering the parietal lobe) in a parasagittal distribution. This raises possibility of watershed infarcts. Small acute infarct anterior medial left frontal lobe. PMH includes diabetes and hypertension   Clinical Impression   Pt admitted with above. Pt independent with ADLs, PTA. Feel pt will benefit from acute OT to address vision. Recommended pt get a full visual field assessment at eye doctor.     Follow Up Recommendations  Outpatient OT; full visual field assessment   Equipment Recommendations  None recommended by OT    Recommendations for Other Services       Precautions / Restrictions Precautions Precautions: None Restrictions Weight Bearing Restrictions: No      Mobility Bed Mobility Overal bed mobility: Modified Independent              Transfers Overall transfer level: Independent Equipment used: None                Balance Pt simulated LB bathing while standing and pt with LOB due to him holding to closet door that moved. Pt simulated holding to steady surface with no LOB.                                       ADL Overall ADL's : Needs assistance/impaired             Lower Body Bathing: Supervison/ safety (standing; initially held to closet door which moved; no LOB noted when pt held to sturdy surface)        Lower Body Dressing: Modified independent;Sit to/from stand   Toilet Transfer: Modified Independent;Ambulation (sit to stand from bed)           Functional mobility during ADLs: Modified independent (for ambulation) General ADL Comments: Recommended holding to shower wall when going to wash LB in shower.     Vision Pt wears reading glasses;  Reports blurry vision in right eye that has been going on for around a year.  Vision Assessment?: Yes Tracking/Visual Pursuits:  (decreased smoothness and pt losing pen at times) Visual Fields:  (inconsistent in bilateral superior fields)  *pt having difficulty keeping eyes on OT's nose   Perception     Praxis      Pertinent Vitals/Pain Pain Assessment: No/denies pain     Hand Dominance     Extremity/Trunk Assessment Upper Extremity Assessment Upper Extremity Assessment: Overall WFL for tasks assessed   Lower Extremity Assessment Lower Extremity Assessment: Defer to PT evaluation       Communication Communication Communication:  (unsure if HOH-seemed a little difficult to understand OT)   Cognition Arousal/Alertness: Awake/alert Behavior During Therapy: WFL for tasks assessed/performed Overall Cognitive Status:  (unsure of baseline-decreased visual attention)                     General Comments       Exercises       Shoulder Instructions      Home Living Family/patient expects to be discharged to:: Private residence Living Arrangements: Spouse/significant other Available Help at Discharge: Family;Available PRN/intermittently Type of Home: House Home Access: Stairs to enter [employer] of Steps: 3 Entrance Stairs-Rails: Left;Right Home Layout: One level  Bathroom Shower/Tub: Tub/shower unit         Home Equipment: None   Additional Comments: Patient reports that his wife does work.       Prior Functioning/Environment Level of Independence: Independent             OT Diagnosis: Disturbance of vision   OT Problem List: Decreased cognition;Impaired vision/perception   OT Treatment/Interventions: Therapeutic activities;Visual/perceptual remediation/compensation;Cognitive remediation/compensation;Patient/family education    OT Goals(Current goals can be found in the care plan section) Acute Rehab OT Goals Patient Stated  Goal: not stated OT Goal Formulation: With patient Time For Goal Achievement: 09/20/15 Potential to Achieve Goals: Good ADL Goals Additional ADL Goal #1: Pt will perform vision exercises/activities at modified independent level.  OT Frequency: Min 2X/week   Barriers to D/C:            Co-evaluation              End of Session    Activity Tolerance: Patient tolerated treatment well Patient left: in bed;with family/visitor present   Time: 1128-1150 OT Time Calculation (min): 22 min Charges:  OT General Charges $OT Visit: 1 Procedure OT Evaluation $OT Eval Low Complexity: 1 Procedure G-Codes: OT G-codes **NOT FOR INPATIENT CLASS** Functional Assessment Tool Used: clinical judgment Functional Limitation: Self care Self Care Current Status [id number]): At least 1 percent but less than 20 percent impaired, limited or restricted Self Care Goal Status [id number]): 0 percent impaired, limited or restricted  Benito Mccreedy OTR/L [id number] 09/13/2015, 1:30 PM

## 2015-09-13 NOTE — Progress Notes (Signed)
  Echocardiogram 2D Echocardiogram has been performed.  Dennis Macias 09/13/2015, 1:53 PM

## 2015-09-13 NOTE — Progress Notes (Signed)
VASCULAR LAB PRELIMINARY  PRELIMINARY  PRELIMINARY  PRELIMINARY  Carotid duplex completed.    Bilateral:  1-39% ICA stenosis.  Vertebral artery flow is antegrade.     Janifer Adie, RVT, RDMS 09/13/2015, 4:44 PM

## 2015-09-13 NOTE — Progress Notes (Addendum)
Inpatient Diabetes Program Recommendations  AACE/ADA: New Consensus Statement on Inpatient Glycemic Control (2015)  Target Ranges:  Prepandial:   less than 140 mg/dL      Peak postprandial:   less than 180 mg/dL (1-2 hours)      Critically ill patients:  140 - 180 mg/dL   Review of Glycemic Control  Diabetes history: DM 2 Outpatient Diabetes medications: None Current orders for Inpatient glycemic control: Novolog Moderate + HS scale  Inpatient Diabetes Program Recommendations:   A1c 10.7% on 09/12/2015. Patient will need Metformin 500 mg BID in addition to Amaryl 2 mg Daily at discharge. Will also probably need glucose meter kit.  Thanks,  Tama Headings RN, MSN, [facility] Inpatient Diabetes Coordinator Team Pager [phone] (8a-5p)

## 2015-09-13 NOTE — Progress Notes (Signed)
    [facility] has been requested to perform a transesophageal echocardiogram on  09/14/15 at 0900 for stroke.  After careful review of history and examination, the risks and benefits of transesophageal echocardiogram have been explained including risks of esophageal damage, perforation (1:10,000 risk), bleeding, pharyngeal hematoma as well as other potential complications associated with conscious sedation including aspiration, arrhythmia, respiratory failure and death. Alternatives to treatment were discussed, questions were answered. Patient is willing to proceed. Family in the room and agreed as well.  Levada Bowersox R,  09/13/2015 3:05 PM

## 2015-09-13 NOTE — Care Management Note (Signed)
Case Management Note  Patient Details  Name: Dennis Macias MRN: [id number] Date of Birth: [birth year]  Subjective/Objective:                    Action/Plan: Patient was admitted with slurred speech.  Patient is currently listed as self-pay and is being followed by Caryl Pina in [employer].  Will follow for discharge needs pending PT/OT evals and physician orders.  Expected Discharge Date:                  Expected Discharge Plan:     In-House Referral:     Discharge planning Services     Post Acute Care Choice:    Choice offered to:     DME Arranged:    DME Agency:     HH Arranged:    HH Agency:     Status of Service:  In process, will continue to follow  Medicare Important Message Given:    Date Medicare IM Given:    Medicare IM give by:    Date Additional Medicare IM Given:    Additional Medicare Important Message give by:     If discussed at [place] of Stay Meetings, dates discussed:    Additional CommentsRolm Baptise, RN 09/13/2015, 10:45 AM [phone]

## 2015-09-13 NOTE — Evaluation (Signed)
Physical Therapy Evaluation/Discharge  Patient Details Name: Dennis Macias MRN: [id number] DOB: Jun 13, 1951 Today's Date: 09/13/2015   History of Present Illness  Patient is a 65 y.o. male who experienced slurred speech, left facial droop, and LUE weakness which resolved after 5-10 minutes. CT Head negative for acute changes. Patient likely had TIA in setting of uncontrolled diabetes and hypertension. PMH: diabetes, hypertension  Clinical Impression  Patient evaluated by Physical Therapy with no further acute PT needs identified. All education has been completed and the patient has no further questions. The patient was able to ambulate 400 feet including up/down stairs without an assistive device or instability. The patient states that he feels like he is about back to his baseline as far as mobility goes. Patient denies any questions or concerns.  PT is signing off. Thank you for this referral.     Follow Up Recommendations No PT follow up    Equipment Recommendations  None recommended by PT    Recommendations for Other Services       Precautions / Restrictions Precautions Precautions: None Restrictions Weight Bearing Restrictions: No      Mobility  Bed Mobility Overal bed mobility: Independent             General bed mobility comments: supine to sit, not using UEs to assist.   Transfers Overall transfer level: Independent Equipment used: None             General transfer comment: no UE support needed.   Ambulation/Gait Ambulation/Gait assistance: Independent Ambulation Distance (Feet): 400 Feet Assistive device: None Gait Pattern/deviations: WFL(Within Functional Limits) Gait velocity: WFL   General Gait Details: no loss of balance or instability noted. Patient able to perform head turns Lt/Rt without loss of balance.   Stairs Stairs: Yes Stairs assistance: Modified independent (Device/Increase time) Stair Management: One rail Right;Alternating  pattern;Forwards Number of Stairs: 5 General stair comments: no assistance needed  Wheelchair Mobility    Modified Rankin (Stroke Patients Only) Modified Rankin (Stroke Patients Only) Pre-Morbid Rankin Score: No symptoms Modified Rankin: No symptoms     Balance Overall balance assessment: No apparent balance deficits (not formally assessed)                                           Pertinent Vitals/Pain Pain Assessment: No/denies pain    Home Living Family/patient expects to be discharged to:: Private residence Living Arrangements: Spouse/significant other Available Help at Discharge: Family;Available PRN/intermittently Type of Home: House Home Access: Stairs to enter Entrance Stairs-Rails: Chemical engineer of Steps: 3 Home Layout: One level Home Equipment: None Additional Comments: Patient reports that his wife does work.     Prior Function Level of Independence: Independent               Hand Dominance        Extremity/Trunk Assessment   Upper Extremity Assessment: Defer to OT evaluation           Lower Extremity Assessment: Overall WFL for tasks assessed         Communication   Communication: No difficulties  Cognition Arousal/Alertness: Awake/alert Behavior During Therapy: WFL for tasks assessed/performed Overall Cognitive Status: Within Functional Limits for tasks assessed                      General Comments General comments (skin integrity, edema, etc.): Patient reporting that  he feels like normal as far as getting up and moving around. Able to don/doff his shoes independently.     Exercises        Assessment/Plan    PT Assessment Patent does not need any further PT services  PT Diagnosis     PT Problem List    PT Treatment Interventions     PT Goals (Current goals can be found in the Care Plan section) Acute Rehab PT Goals Patient Stated Goal: get back home PT Goal Formulation: With  patient Time For Goal Achievement: 09/20/15 Potential to Achieve Goals: Good    Frequency     Barriers to discharge        Co-evaluation               End of Session Equipment Utilized During Treatment: Gait belt Activity Tolerance: Patient tolerated treatment well Patient left: in chair;with call bell/phone within reach;with family/visitor present Nurse Communication: Mobility status         Time: [id number] PT Time Calculation (min) (ACUTE ONLY): 17 min   Charges:   PT Evaluation $PT Eval Low Complexity: 1 Procedure     PT G Codes:        Cassell Clement, PT, CSCS Pager [phone] Office [phone]  09/13/2015, 11:42 AM

## 2015-09-13 NOTE — Progress Notes (Signed)
STROKE TEAM PROGRESS NOTE   HISTORY OF PRESENT ILLNESS Dennis Macias is an 65 y.o. male who has Hx DM and was with his family. At 1310 today, 09/12/2015 he noted sudden onset of difficulty forming his words and some imbalance. HE was brought to [facility] as code stroke. BP 230/120 but symptoms had resolved. EKG showing possible ST elevation. CT head negative for stroke. Patient placed on TIA alert and given 20 mg Labetalol, his blood pressure in the ED was 223/107. He does have decreased vision in his right eye but this is old. He did not experience focal weakness no numbness involving face or extremities. Patient was not administered IV t-PA secondary to symptoms resolved. He was admitted for further evaluation and treatment.   SUBJECTIVE (INTERVAL HISTORY) His daughter is at the bedside.  Overall he feels his condition is stable.    OBJECTIVE Temp:  [98.1 F (36.7 C)-98.9 F (37.2 C)] 98.7 F (37.1 C) (03/07 0558) Pulse Rate:  [70-106] 80 (03/07 1024) Cardiac Rhythm:  [-] Normal sinus rhythm (03/07 0700) Resp:  [10-26] 18 (03/07 1024) BP: (141-250)/(83-137) 188/100 mmHg (03/07 1024) SpO2:  [9 %-100 %] 9 % (03/07 1024)  CBC:   Recent Labs Lab 09/12/15 1347 09/12/15 1356  WBC 8.5  --   NEUTROABS 5.4  --   HGB 15.8 16.7  HCT 43.0 49.0  MCV 83.8  --   PLT 294  --     Basic Metabolic Panel:   Recent Labs Lab 09/12/15 1347 09/12/15 1356  NA 138 136  K 3.9 3.8  CL 99* 96*  CO2 26  --   GLUCOSE 402* 388*  BUN 9 13  CREATININE 1.10 0.90  CALCIUM 9.5  --     Lipid Panel:     Component Value Date/Time   CHOL 234* 09/13/2015 0546   TRIG 183* 09/13/2015 0546   HDL 49 09/13/2015 0546   CHOLHDL 4.8 09/13/2015 0546   VLDL 37 09/13/2015 0546   LDLCALC 148* 09/13/2015 0546   HgbA1c:  Lab Results  Component Value Date   HGBA1C 10.7* 09/12/2015   Urine Drug Screen: No results found for: LABOPIA, COCAINSCRNUR, LABBENZ, AMPHETMU, THCU, LABBARB    IMAGING  Ct Head Wo  Contrast 09/12/2015  Mild age-related involutional change with no acute findings.   MRI HEAD  09/12/2015  Two small acute infarcts right mid to posterior frontal lobe (bordering the parietal lobe) in a parasagittal distribution. This raises possibility of watershed infarcts. Small acute infarct anterior medial left frontal lobe. No intracranial hemorrhage. Moderate to marked small vessel disease changes. Global atrophy without hydrocephalus. No intracranial mass lesion noted on this unenhanced exam. Cervical spondylotic changes with spinal stenosis and cord flattening C3-4 level. This can be evaluated with cervical spine MR if clinically desired. Decreased signal intensity of bone marrow may be related to patient's habitus. 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 Tiny aneurysm arising from the anterior communicating artery region directed posteriorly and to the right may be present versus residua of the distal A1 segment of the right anterior cerebral artery. Right vertebral artery is dominant without significant stenosis. Narrowing and irregularity of the distal left vertebral artery. Ectatic basilar artery with slight irregularity and minimal narrowing without high-grade stenosis. Posterior circulation branch vessel irregularity as noted above.  PHYSICAL EXAM Pleasant middle-aged African-American male not in distress. . Afebrile. Head is nontraumatic. Neck is supple without bruit.    Cardiac exam no murmur or gallop. Lungs are clear to auscultation. Distal pulses are well felt. Neurological Exam :  Awake alert oriented x 3 normal speech and language. Mild left lower face asymmetry. Tongue midline. No drift. Mild diminished fine finger movements on left. Orbits right over left upper  extremity. Mild left grip weak.. Normal sensation . Normal coordination. ASSESSMENT/PLAN [name] is a 65 y.o. male with history of diabetes presenting with difficulty forming his words  And gait imbalance. He did not receive IV t-PA due to symptoms resolved.   Stroke: bilateral small infarcts in R and L frontal lobes,  infarcts in setting of uncontrolled BP and glucose as well as dehydration. Infarcts felts to be secondary to small vessel disease source  Resultant  Clumsiness L hand, other sx resolved  MRI   2 Small infarcts right mid to posterior frontal lobe and small infarct, left frontal lobe.Moderate small vessel disease. Cord flattening, C3-4.  MRA  Hypoplastic A1 , otherwise no large vessel stenosis. Right vertebral is dominant.  Carotid Doppler  pending   2D Echo  pending   LDL 148  HgbA1c 10.7  Lovenox 40 mg sq daily for VTE prophylaxis Diet heart healthy/carb modified Room service appropriate?: Yes; Fluid consistency:: Thin  No antithrombotic prior to admission, now on aspirin 81 mg daily  Patient counseled to be compliant with his antithrombotic medications. Also encouraged to eat a heart healthy/diabetic diet and to exercise/walk  Ongoing aggressive stroke risk factor management  Therapy recommendations:  No PT  Disposition:  Anticipate return home  Hypertensive emergency  Blood pressure 230/120 in setting neurologic symptoms  Remains elevated, as high as 209/101 this am Permissive hypertension (OK if < 220/120) but gradually normalize in 5-7 days  Hyperlipidemia  Home meds:  No statin  LDL 148, goal < 70  Recommend addition of statin  Continue statin at discharge  Diabetes type II  HgbA1c 10.7, goal < 7.0  Uncontrolled  IP diabetes coordinator has seen and made recommendations  Other Stroke Risk Factors  ETOH use  Other Active Problems  Right bundle-branch block  Hospital day #   [place]  for Pager information 09/13/2015 1:03 PM   I have personally examined this patient, reviewed notes, independently viewed imaging studies, participated in medical decision making and plan of care. I have made any additions or clarifications directly to the above note. Agree with note above. He presented with transient speech difficulties and gait imbalance and MRI scan shows small bilateral lacunar infarcts likely related to small vessel disease. He remains at risk for neurological worsening, recurrent stroke, TIA needs ongoing stroke evaluation and aggressive risk factor modification. Recommend he take aspirin for stroke prevention daily and maintain strict control of diabetes, hypertension and hyperlipidemia.  Antony Contras, MD Medical Director [facility] Pager: [phone] 09/13/2015 2:49 PM   To contact Stroke Continuity provider, please refer to [email or web address] After hours, contact General Neurology

## 2015-09-14 ENCOUNTER — Encounter (HOSPITAL_COMMUNITY)
Admission: EM | Disposition: A | Discharge status: Home / Self Care | Payer: Self-pay | Source: Home / Self Care | Attending: Internal Medicine

## 2015-09-14 ENCOUNTER — Encounter (HOSPITAL_COMMUNITY): Payer: Self-pay | Admitting: *Deleted

## 2015-09-14 ENCOUNTER — Inpatient Hospital Stay (HOSPITAL_COMMUNITY): Payer: MEDICAID

## 2015-09-14 DIAGNOSIS — I639 Cerebral infarction, unspecified: Secondary | ICD-10-CM

## 2015-09-14 HISTORY — PX: TEE WITHOUT CARDIOVERSION: SHX5443

## 2015-09-14 LAB — GLUCOSE, CAPILLARY
Glucose-Capillary: 229 mg/dL — ABNORMAL HIGH (ref 65–99)
Glucose-Capillary: 200 mg/dL — ABNORMAL HIGH (ref 65–99)
Glucose-Capillary: 236 mg/dL — ABNORMAL HIGH (ref 65–99)

## 2015-09-14 LAB — HIV ANTIBODY (ROUTINE TESTING W REFLEX): HIV Screen 4th Generation wRfx: NONREACTIVE

## 2015-09-14 SURGERY — ECHOCARDIOGRAM, TRANSESOPHAGEAL
Anesthesia: Moderate Sedation

## 2015-09-14 MED ORDER — ASPIRIN 81 MG PO TABS: 81.0000 mg | ORAL_TABLET | Freq: Every day | ORAL | Status: DC

## 2015-09-14 MED ORDER — MIDAZOLAM HCL 5 MG/ML IJ SOLN
INTRAMUSCULAR | Status: AC
  Filled 2015-09-14: qty 2

## 2015-09-14 MED ORDER — MIDAZOLAM HCL 10 MG/2ML IJ SOLN
INTRAMUSCULAR | Status: DC | PRN
  Administered 2015-09-14 (×2): 2 mg via INTRAVENOUS
  Administered 2015-09-14: 1 mg via INTRAVENOUS

## 2015-09-14 MED ORDER — SODIUM CHLORIDE 0.9 % IV SOLN: INTRAVENOUS | Status: DC

## 2015-09-14 MED ORDER — FENTANYL CITRATE (PF) 100 MCG/2ML IJ SOLN
INTRAMUSCULAR | Status: AC
Start: 2015-09-14 — End: 2015-09-14
  Filled 2015-09-14: qty 2

## 2015-09-14 MED ORDER — METFORMIN HCL 500 MG PO TABS: 500.0000 mg | ORAL_TABLET | Freq: Every day | ORAL | Status: DC

## 2015-09-14 MED ORDER — LISINOPRIL 10 MG PO TABS: 10.0000 mg | ORAL_TABLET | Freq: Every day | ORAL | Status: DC

## 2015-09-14 MED ORDER — BUTAMBEN-TETRACAINE-BENZOCAINE 2-2-14 % EX AERO
INHALATION_SPRAY | CUTANEOUS | Status: DC | PRN
  Administered 2015-09-14: 2 via TOPICAL

## 2015-09-14 MED ORDER — METFORMIN HCL 500 MG PO TABS
500.0000 mg | ORAL_TABLET | Freq: Every day | ORAL | Status: DC
Start: 2015-09-14 — End: 2015-09-29

## 2015-09-14 MED ORDER — FENTANYL CITRATE (PF) 100 MCG/2ML IJ SOLN
INTRAMUSCULAR | Status: DC | PRN
  Administered 2015-09-14: 50 ug via INTRAVENOUS
  Administered 2015-09-14: 25 ug via INTRAVENOUS

## 2015-09-14 MED ORDER — LISINOPRIL 10 MG PO TABS
10.0000 mg | ORAL_TABLET | Freq: Every day | ORAL | Status: DC
  Administered 2015-09-14: 10 mg via ORAL
  Filled 2015-09-14: qty 1

## 2015-09-14 MED ORDER — ATORVASTATIN CALCIUM 80 MG PO TABS: 80.0000 mg | ORAL_TABLET | Freq: Every day | ORAL | Status: DC

## 2015-09-14 NOTE — Progress Notes (Signed)
Patient is discharged from unit 5M09 at this time. Alert and in stable condition. IV site d/c'd as well as tele. Instructions read to patient with understanding verbalized. Left unit via wheelchair with all belongings and family at side.

## 2015-09-14 NOTE — Interval H&P Note (Signed)
History and Physical Interval Note:  09/14/2015 9:08 AM  Dennis Macias  has presented today for surgery, with the diagnosis of STROKE  The various methods of treatment have been discussed with the patient and family. After consideration of risks, benefits and other options for treatment, the patient has consented to  Procedure(s): TRANSESOPHAGEAL ECHOCARDIOGRAM (TEE) (N/A) as a surgical intervention .  The patient's history has been reviewed, patient examined, no change in status, stable for surgery.  I have reviewed the patient's chart and labs.  Questions were answered to the patient's satisfaction.     Emmerich Cryer [employer]

## 2015-09-14 NOTE — Discharge Summary (Signed)
Name: Dennis Macias MRN: [id number] DOB: [birth year] 65 y.o. PCP: No primary care provider on file.  Date of Admission: 09/12/2015  1:46 PM Date of Discharge: 09/14/2015 Attending Physician: Oval Linsey, MD  Discharge Diagnosis: 1. Multiple Acute CVA 2. HTN 3. DMII 4. HLD  Principal Problem:   Acute CVA (cerebrovascular accident) ([place]) Active Problems:   Uncontrolled type 2 diabetes mellitus ([place])   Hypertensive urgency   Hyperlipidemia  Discharge Medications:   Medication List    TAKE these medications        acetaminophen 325 MG tablet  Commonly known as:  TYLENOL  Take 650 mg by mouth every 6 (six) hours as needed for mild pain.     aspirin 81 MG tablet  Take 1 tablet (81 mg total) by mouth daily.     atorvastatin 80 MG tablet  Commonly known as:  LIPITOR  Take 1 tablet (80 mg total) by mouth daily at 6 PM.     lisinopril 10 MG tablet  Commonly known as:  PRINIVIL,ZESTRIL  Take 1 tablet (10 mg total) by mouth daily.     metFORMIN 500 MG tablet  Commonly known as:  GLUCOPHAGE  Take 1 tablet (500 mg total) by mouth daily with supper.        Disposition and follow-up:   Dennis Macias was discharged from [facility] in Good condition.  At the hospital follow up visit please address:  1.  BP control, DM control, medication adherence, alcohol cessation  2.  Labs / imaging needed at time of follow-up: BMP on f/u, lipid panel in 4-6 weeks after discharge  3.  Pending labs/ test needing follow-up: none  Follow-up Appointments: Follow-up Information    Follow up with Luanne Bras, MD On 09/29/2015.   Specialty:  Internal Medicine   Why:  10:15a   Contact information:   [place] [id number] [phone]       Discharge Instructions: Discharge Instructions    Call MD for:  difficulty breathing, headache or visual disturbances    Complete by:  As directed      Call MD for:  extreme fatigue    Complete by:  As directed      Call MD for:  extreme fatigue    Complete by:  As directed      Call MD for:  persistant dizziness or light-headedness    Complete by:  As directed      Call MD for:  persistant dizziness or light-headedness    Complete by:  As directed      Call MD for:  persistant nausea and vomiting    Complete by:  As directed      Call MD for:  persistant nausea and vomiting    Complete by:  As directed      Call MD for:  severe uncontrolled pain    Complete by:  As directed      Call MD for:  temperature >100.4    Complete by:  As directed      Diet - low sodium heart healthy    Complete by:  As directed      Diet - low sodium heart healthy    Complete by:  As directed      Increase activity slowly    Complete by:  As directed      Increase activity slowly    Complete by:  As directed  Consultations: Treatment Team:  Md Stroke, MD  Procedures Performed:  Ct Head Wo Contrast  09/12/2015  ADDENDUM REPORT: 09/12/2015 18:03 ADDENDUM: Critical Value/emergent results were called by telephone at the time of interpretation on 09/12/2015 at 2:01 Pm to Dr. Nicole Kindred , who verbally acknowledged these results. Electronically Signed   By: Skipper Cliche M.D.   On: 09/12/2015 18:03  09/12/2015  CLINICAL DATA:  Slurred speech, unsteady gait EXAM: CT HEAD WITHOUT CONTRAST TECHNIQUE: Contiguous axial images were obtained from the base of the skull through the vertex without intravenous contrast. COMPARISON:  None. FINDINGS: No parenchymal hemorrhage or extra-axial fluid. Mild age-related atrophy and low attenuation in the deep white matter. New no evidence of mass or vascular territory infarct. No hydrocephalus. IMPRESSION: Mild age-related involutional change with no acute findings. Electronically Signed: By: Skipper Cliche M.D. On: 09/12/2015 14:00   Dennis Macias Wo Contrast  09/12/2015  CLINICAL DATA:  65 year old diabetic hypertensive male with sudden difficulty with speech and some and balance.  Subsequent encounter. EXAM: MRI HEAD WITHOUT CONTRAST MRA HEAD WITHOUT CONTRAST TECHNIQUE: Multiplanar, multiecho pulse sequences of the Macias and surrounding structures were obtained without intravenous contrast. Angiographic images of the head were obtained using MRA technique without contrast. COMPARISON:  09/12/2015 CT.  No comparison Dennis. FINDINGS: MRI HEAD FINDINGS Two small acute infarcts right mid to posterior frontal lobe (bordering the parietal lobe) in a parasagittal distribution. This raises possibility of watershed infarcts. Small acute infarct anterior medial left frontal lobe. No intracranial hemorrhage. Moderate to marked small vessel disease changes. Global atrophy without hydrocephalus. No intracranial mass lesion noted on this unenhanced exam. Minimal exophthalmos. Cervical spondylotic changes with spinal stenosis and cord flattening C3-4 level. This can be evaluated with cervical spine Dennis if clinically desired. Decreased signal intensity of bone marrow may be related to patient's habitus. Correlation with CBC to exclude anemia contributing to this appearance may be considered Pituitary region and cervical medullary junction unremarkable. MRA HEAD FINDINGS Hypoplastic A1 segment right anterior cerebral artery otherwise anterior circulation without medium or large size vessel significant stenosis or occlusion. Tiny aneurysm arising from the anterior communicating artery region directed posteriorly and to the right may be present versus residua of the distal A1 segment of the right anterior cerebral artery. Right vertebral artery is dominant without significant stenosis. Narrowing and irregularity of the distal left vertebral artery. Poor delineation of the left posterior inferior cerebellar artery. Mild to moderate narrowing mid to distal aspect right posterior inferior cerebellar artery. Ectatic basilar artery with slight irregularity and minimal narrowing without high-grade stenosis. Moderate  tandem stenosis proximal left anterior inferior cerebellar artery. Mild to slight moderate narrowing portions of the right anterior inferior cerebellar artery. Mild irregularity distal branches superior cerebellar artery bilaterally. Mild irregularity narrowing portions of the distal posterior cerebral arteries bilaterally. IMPRESSION: MRI HEAD Two small acute infarcts right mid to posterior frontal lobe (bordering the parietal lobe) in a parasagittal distribution. This raises possibility of watershed infarcts. Small acute infarct anterior medial left frontal lobe. No intracranial hemorrhage. Moderate to marked small vessel disease changes. Global atrophy without hydrocephalus. No intracranial mass lesion noted on this unenhanced exam. Cervical spondylotic changes with spinal stenosis and cord flattening C3-4 level. This can be evaluated with cervical spine Dennis if clinically desired. Decreased signal intensity of bone marrow may be related to patient's habitus. Correlation with CBC to exclude anemia contributing to this appearance may be considered MRA HEAD Hypoplastic A1 segment right anterior cerebral artery otherwise anterior circulation  without medium or large size vessel significant stenosis or occlusion. Tiny aneurysm arising from the anterior communicating artery region directed posteriorly and to the right may be present versus residua of the distal A1 segment of the right anterior cerebral artery. Right vertebral artery is dominant without significant stenosis. Narrowing and irregularity of the distal left vertebral artery. Ectatic basilar artery with slight irregularity and minimal narrowing without high-grade stenosis. Posterior circulation branch vessel irregularity as noted above. Electronically Signed   By: Genia Del M.D.   On: 09/12/2015 21:29   Dennis Macias Wo Contrast  09/12/2015  CLINICAL DATA:  65 year old diabetic hypertensive male with sudden difficulty with speech and some and balance.  Subsequent encounter. EXAM: MRI HEAD WITHOUT CONTRAST MRA HEAD WITHOUT CONTRAST TECHNIQUE: Multiplanar, multiecho pulse sequences of the Macias and surrounding structures were obtained without intravenous contrast. Angiographic images of the head were obtained using MRA technique without contrast. COMPARISON:  09/12/2015 CT.  No comparison Dennis. FINDINGS: MRI HEAD FINDINGS Two small acute infarcts right mid to posterior frontal lobe (bordering the parietal lobe) in a parasagittal distribution. This raises possibility of watershed infarcts. Small acute infarct anterior medial left frontal lobe. No intracranial hemorrhage. Moderate to marked small vessel disease changes. Global atrophy without hydrocephalus. No intracranial mass lesion noted on this unenhanced exam. Minimal exophthalmos. Cervical spondylotic changes with spinal stenosis and cord flattening C3-4 level. This can be evaluated with cervical spine Dennis if clinically desired. Decreased signal intensity of bone marrow may be related to patient's habitus. Correlation with CBC to exclude anemia contributing to this appearance may be considered Pituitary region and cervical medullary junction unremarkable. MRA HEAD FINDINGS Hypoplastic A1 segment right anterior cerebral artery otherwise anterior circulation without medium or large size vessel significant stenosis or occlusion. Tiny aneurysm arising from the anterior communicating artery region directed posteriorly and to the right may be present versus residua of the distal A1 segment of the right anterior cerebral artery. Right vertebral artery is dominant without significant stenosis. Narrowing and irregularity of the distal left vertebral artery. Poor delineation of the left posterior inferior cerebellar artery. Mild to moderate narrowing mid to distal aspect right posterior inferior cerebellar artery. Ectatic basilar artery with slight irregularity and minimal narrowing without high-grade stenosis. Moderate  tandem stenosis proximal left anterior inferior cerebellar artery. Mild to slight moderate narrowing portions of the right anterior inferior cerebellar artery. Mild irregularity distal branches superior cerebellar artery bilaterally. Mild irregularity narrowing portions of the distal posterior cerebral arteries bilaterally. IMPRESSION: MRI HEAD Two small acute infarcts right mid to posterior frontal lobe (bordering the parietal lobe) in a parasagittal distribution. This raises possibility of watershed infarcts. Small acute infarct anterior medial left frontal lobe. No intracranial hemorrhage. Moderate to marked small vessel disease changes. Global atrophy without hydrocephalus. No intracranial mass lesion noted on this unenhanced exam. Cervical spondylotic changes with spinal stenosis and cord flattening C3-4 level. This can be evaluated with cervical spine Dennis if clinically desired. Decreased signal intensity of bone marrow may be related to patient's habitus. Correlation with CBC to exclude anemia contributing to this appearance may be considered MRA HEAD Hypoplastic A1 segment right anterior cerebral artery otherwise anterior circulation without medium or large size vessel significant stenosis or occlusion. Tiny aneurysm arising from the anterior communicating artery region directed posteriorly and to the right may be present versus residua of the distal A1 segment of the right anterior cerebral artery. Right vertebral artery is dominant without significant stenosis. Narrowing and irregularity of the distal left  vertebral artery. Ectatic basilar artery with slight irregularity and minimal narrowing without high-grade stenosis. Posterior circulation branch vessel irregularity as noted above. Electronically Signed   By: Genia Del M.D.   On: 09/12/2015 21:29    2D Echo:  TTE: Study Conclusions  - Left ventricle: The cavity size was normal. Wall thickness was  increased in a pattern of mild LVH. Systolic  function was normal.  The estimated ejection fraction was in the range of 60% to 65%.  Wall motion was normal; there were no regional wall motion  abnormalities. Doppler parameters are consistent with abnormal  left ventricular relaxation (grade 1 diastolic dysfunction).  TEE: Final read pending.  Preliminary report: Normal LV size with mild LV hypertrophy. EF 60-65%. Normal RV size and systolic function. Trivial Dennis, no TR. Trileaflet aortic valve with no stenosis or regurgitation. Normal right atrial size. Normal left atrial size with no LAA thrombus. Negative bubble study, no ASD or PFO. Normal caliber aorta with mild plaque in the descending thoracic aorta.   Cardiac Cath:   Admission HPI: Dennis Macias is a 65 year old male with PMH of T2DM who presents with slurred speech. Patient states he was at his usual state of health, playing with his grandkids, when he had sudden onset of slurred speech and difficulty getting his words out. His family reports seeing left sided facial droop at his mouth and weakness of his left upper extremity with inability to grab a can. He says he did not try to walk, so is unsure if he had ataxic gait. This began around 1 PM and lasted approximately 5-10 minutes before returning to his baseline health. He has not taken any medication for diabetes for several years now or have a regular physician. Patient denied any chest pain, palpitations, N/V/D/C, diaphoresis, change in vision, headache, SOB, fall, loss of consciousness, or seizure-like activity.  EMS were called and his BP was 180/100. In the ED his blood pressure was 230/120, CT head was negative for acute stroke or hemorrhage. He was given Labetalol 20 mg with subsequent BP of 181/99.   Hospital Course by problem list: Principal Problem:   Acute CVA (cerebrovascular accident) [facility]) Active Problems:   Uncontrolled type 2 diabetes mellitus ([place])   Hypertensive urgency   Hyperlipidemia   Multiple  Acute CVA: Patient presented following slurred speech, with resolution of symptoms before arrival to the ED.  He was not given tPA as his symptoms had resolved.  MRI revealed two small acute infarcts to right mid to posterior frontal lobe and small acute infarct to anterior medial left frontal lobe.  His cholesterol was elevated to 269.  His A1c was 10.7%.  He was started on ASA 81mg , Atorvastatin 80mg , Metformin 500mg  qHS, and Lisinopril 10mg .    HTN: Patient's BP in the ED was elevated to > 230/120 mmHg.  Permissive HTN was allowed for the first 48 hours.  He was started on Lisinopril 10 mg for BP control at discharge.  At follow up, please recheck BP and BMET and make further medication adjustments as necessary.  DMII: Patient's A1c on presentation was 10.7%.  He was managed with SSI while inpatient.  He was started on Metformin 500 mg qHS at discharge.  At follow up, please titrate Metformin and add further glycemic control agents as necessary.  HLD: Patient's lipid panel on presentation was TChol 269, HDL 62, Triglycerides 145.  He was started on Atorvastatin 80 mg daily.  Discharge Vitals:   BP 163/104  mmHg  Pulse 76  Temp(Src) 98.5 F (36.9 C) (Oral)  Resp 17  Ht 6' (1.829 m)  Wt 178 lb (80.74 kg)  BMI 24.14 kg/m2  SpO2 96%  Discharge Labs:  Results for orders placed or performed during the hospital encounter of 09/12/15 (from the past 24 hour(s))  Glucose, capillary     Status: Abnormal   Collection Time: 09/13/15  4:46 PM  Result Value Ref Range   Glucose-Capillary 173 (H) 65 - 99 mg/dL  Glucose, capillary     Status: Abnormal   Collection Time: 09/13/15 10:05 PM  Result Value Ref Range   Glucose-Capillary 257 (H) 65 - 99 mg/dL  Glucose, capillary     Status: Abnormal   Collection Time: 09/14/15  6:22 AM  Result Value Ref Range   Glucose-Capillary 229 (H) 65 - 99 mg/dL  Glucose, capillary     Status: Abnormal   Collection Time: 09/14/15  7:52 AM  Result Value Ref Range    Glucose-Capillary 200 (H) 65 - 99 mg/dL   Comment 1 Notify RN    Comment 2 Document in Chart   Glucose, capillary     Status: Abnormal   Collection Time: 09/14/15 11:35 AM  Result Value Ref Range   Glucose-Capillary 236 (H) 65 - 99 mg/dL   Comment 1 Notify RN    Comment 2 Document in Chart     Signed: Zada Finders, MD 09/14/2015, 12:24 PM    Services Ordered on Discharge: none Equipment Ordered on Discharge: none

## 2015-09-14 NOTE — Progress Notes (Signed)
   Subjective: Patient feels well, no changes overnight. Objective: Vital signs in last 24 hours: Filed Vitals:   09/14/15 0950 09/14/15 0955 09/14/15 1000 09/14/15 1005  BP: 166/97  163/104   Pulse: 77 77 75 76  Temp:      TempSrc:      Resp: 13 9 16 17   Height:      Weight:      SpO2: 99% 97% 98% 96%   Weight change:   Intake/Output Summary (Last 24 hours) at 09/14/15 1221 Last data filed at 09/14/15 0007  Gross per 24 hour  Intake    240 ml  Output      0 ml  Net    240 ml   General: resting in bed, no acute distress Cardiac: RRR, no rubs, murmurs or gallops Pulm: clear to auscultation bilaterally, moving normal volumes of air Abd: soft, nontender, nondistended, BS present Ext: warm and well perfused, no pedal edema Neuro: alert and oriented X3, cranial nerves II-XII grossly intact  Assessment/Plan: Principal Problem:   Acute CVA (cerebrovascular accident) ([place]) Active Problems:   Uncontrolled type 2 diabetes mellitus ([place])   Hypertensive urgency   Hyperlipidemia  Acute CVA: MRI of brain with two small acute infarcts right mid to posterior frontal lobe bordering the parietal lobe in a parasaggital distribution. Also showing small acute infarct of the anterior medial left frontal lobe. Neurology suspects small vessel disease. Patient's symptoms of left facial droop, slurred speech, and LUE weakness have resolved. Will need risk factor modification and control of hypertension, diabetes, hyperlipidemia. TTE and TEE without source of embolus. -Continue Aspirin 81 mg -Atorvastatin 80 mg -Lifestyle modifications -f/u in [facility] -d/c to home today  T2DM: Hgb A1C 10.7 -Add Metformin 500 mg daily, titrate up on f/u  HTN:  -Lisinopril 10 mg qd -BMET on f/u  HLD: -Atorvastatin 80 mg    Dispo:  Anticipated discharge today.   LOS: 1 day   Zada Finders, MD 09/14/2015, 12:21 PM

## 2015-09-14 NOTE — Progress Notes (Signed)
  Echocardiogram Echocardiogram Transesophageal has been performed.  Jennette Dubin 09/14/2015, 10:03 AM

## 2015-09-14 NOTE — Discharge Instructions (Signed)
1. Take Aspirin 81 mg daily. 2. Take Atorvastatin 80 mg daily. 3. Take Metformin 500 mg every evening. 4. Take Lisinopril 10 mg daily. 5. Follow up in [facility] Medicine Clinic.

## 2015-09-14 NOTE — CV Procedure (Signed)
Procedure: TEE  Indication: CVA  Sedation: Versed 5 mg IV, Fentanyl 75 mcg IV  Findings: Please see echo section for full report.  Normal LV size with mild LV hypertrophy.  EF 60-65%.  Normal RV size and systolic function.  Trivial MR, no TR.  Trileaflet aortic valve with no stenosis or regurgitation.  Normal right atrial size.  Normal left atrial size with no LAA thrombus.  Negative bubble study, no ASD or PFO.  Normal caliber aorta with mild plaque in the descending thoracic aorta.   No source of embolus.   Dennis Macias 09/14/2015 9:24 AM

## 2015-09-14 NOTE — H&P (View-Only) (Signed)
STROKE TEAM PROGRESS NOTE   HISTORY OF PRESENT ILLNESS Dennis Macias is an 65 y.o. male who has Hx DM and was with his family. At 1310 today, 09/12/2015 he noted sudden onset of difficulty forming his words and some imbalance. HE was brought to [facility] as code stroke. BP 230/120 but symptoms had resolved. EKG showing possible ST elevation. CT head negative for stroke. Patient placed on TIA alert and given 20 mg Labetalol, his blood pressure in the ED was 223/107. He does have decreased vision in his right eye but this is old. He did not experience focal weakness no numbness involving face or extremities. Patient was not administered IV t-PA secondary to symptoms resolved. He was admitted for further evaluation and treatment.   SUBJECTIVE (INTERVAL HISTORY) His daughter is at the bedside.  Overall he feels his condition is stable.    OBJECTIVE Temp:  [98.1 F (36.7 C)-98.9 F (37.2 C)] 98.7 F (37.1 C) (03/07 0558) Pulse Rate:  [70-106] 80 (03/07 1024) Cardiac Rhythm:  [-] Normal sinus rhythm (03/07 0700) Resp:  [10-26] 18 (03/07 1024) BP: (141-250)/(83-137) 188/100 mmHg (03/07 1024) SpO2:  [9 %-100 %] 9 % (03/07 1024)  CBC:   Recent Labs Lab 09/12/15 1347 09/12/15 1356  WBC 8.5  --   NEUTROABS 5.4  --   HGB 15.8 16.7  HCT 43.0 49.0  MCV 83.8  --   PLT 294  --     Basic Metabolic Panel:   Recent Labs Lab 09/12/15 1347 09/12/15 1356  NA 138 136  K 3.9 3.8  CL 99* 96*  CO2 26  --   GLUCOSE 402* 388*  BUN 9 13  CREATININE 1.10 0.90  CALCIUM 9.5  --     Lipid Panel:     Component Value Date/Time   CHOL 234* 09/13/2015 0546   TRIG 183* 09/13/2015 0546   HDL 49 09/13/2015 0546   CHOLHDL 4.8 09/13/2015 0546   VLDL 37 09/13/2015 0546   LDLCALC 148* 09/13/2015 0546   HgbA1c:  Lab Results  Component Value Date   HGBA1C 10.7* 09/12/2015   Urine Drug Screen: No results found for: LABOPIA, COCAINSCRNUR, LABBENZ, AMPHETMU, THCU, LABBARB    IMAGING  Ct Head Wo  Contrast 09/12/2015  Mild age-related involutional change with no acute findings.   MRI HEAD  09/12/2015  Two small acute infarcts right mid to posterior frontal lobe (bordering the parietal lobe) in a parasagittal distribution. This raises possibility of watershed infarcts. Small acute infarct anterior medial left frontal lobe. No intracranial hemorrhage. Moderate to marked small vessel disease changes. Global atrophy without hydrocephalus. No intracranial mass lesion noted on this unenhanced exam. Cervical spondylotic changes with spinal stenosis and cord flattening C3-4 level. This can be evaluated with cervical spine MR if clinically desired. Decreased signal intensity of bone marrow may be related to patient's habitus. 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 Tiny aneurysm arising from the anterior communicating artery region directed posteriorly and to the right may be present versus residua of the distal A1 segment of the right anterior cerebral artery. Right vertebral artery is dominant without significant stenosis. Narrowing and irregularity of the distal left vertebral artery. Ectatic basilar artery with slight irregularity and minimal narrowing without high-grade stenosis. Posterior circulation branch vessel irregularity as noted above.  PHYSICAL EXAM Pleasant middle-aged African-American male not in distress. . Afebrile. Head is nontraumatic. Neck is supple without bruit.    Cardiac exam no murmur or gallop. Lungs are clear to auscultation. Distal pulses are well felt. Neurological Exam :  Awake alert oriented x 3 normal speech and language. Mild left lower face asymmetry. Tongue midline. No drift. Mild diminished fine finger movements on left. Orbits right over left upper  extremity. Mild left grip weak.. Normal sensation . Normal coordination. ASSESSMENT/PLAN [name] is a 65 y.o. male with history of diabetes presenting with difficulty forming his words  And gait imbalance. He did not receive IV t-PA due to symptoms resolved.   Stroke: bilateral small infarcts in R and L frontal lobes,  infarcts in setting of uncontrolled BP and glucose as well as dehydration. Infarcts felts to be secondary to small vessel disease source  Resultant  Clumsiness L hand, other sx resolved  MRI   2 Small infarcts right mid to posterior frontal lobe and small infarct, left frontal lobe.Moderate small vessel disease. Cord flattening, C3-4.  MRA  Hypoplastic A1 , otherwise no large vessel stenosis. Right vertebral is dominant.  Carotid Doppler  pending   2D Echo  pending   LDL 148  HgbA1c 10.7  Lovenox 40 mg sq daily for VTE prophylaxis Diet heart healthy/carb modified Room service appropriate?: Yes; Fluid consistency:: Thin  No antithrombotic prior to admission, now on aspirin 81 mg daily  Patient counseled to be compliant with his antithrombotic medications. Also encouraged to eat a heart healthy/diabetic diet and to exercise/walk  Ongoing aggressive stroke risk factor management  Therapy recommendations:  No PT  Disposition:  Anticipate return home  Hypertensive emergency  Blood pressure 230/120 in setting neurologic symptoms  Remains elevated, as high as 209/101 this am Permissive hypertension (OK if < 220/120) but gradually normalize in 5-7 days  Hyperlipidemia  Home meds:  No statin  LDL 148, goal < 70  Recommend addition of statin  Continue statin at discharge  Diabetes type II  HgbA1c 10.7, goal < 7.0  Uncontrolled  IP diabetes coordinator has seen and made recommendations  Other Stroke Risk Factors  ETOH use  Other Active Problems  Right bundle-branch block  Hospital day #   [place]  for Pager information 09/13/2015 1:03 PM   I have personally examined this patient, reviewed notes, independently viewed imaging studies, participated in medical decision making and plan of care. I have made any additions or clarifications directly to the above note. Agree with note above. He presented with transient speech difficulties and gait imbalance and MRI scan shows small bilateral lacunar infarcts likely related to small vessel disease. He remains at risk for neurological worsening, recurrent stroke, TIA needs ongoing stroke evaluation and aggressive risk factor modification. Recommend he take aspirin for stroke prevention daily and maintain strict control of diabetes, hypertension and hyperlipidemia.  Antony Contras, MD Medical Director [facility] Stroke Center Pager: [phone] 09/13/2015 2:49 PM   To contact Stroke Continuity provider, please refer to [email or web address] After hours, contact General Neurology

## 2015-09-14 NOTE — Progress Notes (Addendum)
STROKE TEAM PROGRESS NOTE   HISTORY OF PRESENT ILLNESS Dennis Macias is an 65 y.o. male who has Hx DM and was with his family. At 1310 today, 09/12/2015 he noted sudden onset of difficulty forming his words and some imbalance. HE was brought to [facility] as code stroke. BP 230/120 but symptoms had resolved. EKG showing possible ST elevation. CT head negative for stroke. Patient placed on TIA alert and given 20 mg Labetalol, his blood pressure in the ED was 223/107. He does have decreased vision in his right eye but this is old. He did not experience focal weakness no numbness involving face or extremities. Patient was not administered IV t-PA secondary to symptoms resolved. He was admitted for further evaluation and treatment.   SUBJECTIVE (INTERVAL HISTORY) His daughter is at the bedside.  Overall he feels his condition is stable.    OBJECTIVE Temp:  [98.3 F (36.8 C)-99.6 F (37.6 C)] 98.5 F (36.9 C) (03/08 0940) Pulse Rate:  [71-85] 76 (03/08 1005) Cardiac Rhythm:  [-] Normal sinus rhythm (03/08 0700) Resp:  [8-23] 17 (03/08 1005) BP: (156-211)/(83-108) 163/104 mmHg (03/08 1000) SpO2:  [93 %-100 %] 96 % (03/08 1005) Weight:  [178 lb (80.74 kg)] 178 lb (80.74 kg) (03/08 0823)  CBC:   Recent Labs Lab 09/12/15 1347 09/12/15 1356  WBC 8.5  --   NEUTROABS 5.4  --   HGB 15.8 16.7  HCT 43.0 49.0  MCV 83.8  --   PLT 294  --     Basic Metabolic Panel:   Recent Labs Lab 09/12/15 1347 09/12/15 1356  NA 138 136  K 3.9 3.8  CL 99* 96*  CO2 26  --   GLUCOSE 402* 388*  BUN 9 13  CREATININE 1.10 0.90  CALCIUM 9.5  --     Lipid Panel:     Component Value Date/Time   CHOL 234* 09/13/2015 0546   TRIG 183* 09/13/2015 0546   HDL 49 09/13/2015 0546   CHOLHDL 4.8 09/13/2015 0546   VLDL 37 09/13/2015 0546   LDLCALC 148* 09/13/2015 0546   HgbA1c:  Lab Results  Component Value Date   HGBA1C 10.7* 09/12/2015   Urine Drug Screen: No results found for: LABOPIA, COCAINSCRNUR, LABBENZ,  AMPHETMU, THCU, LABBARB    IMAGING  Ct Head Wo Contrast 09/12/2015  Mild age-related involutional change with no acute findings.   MRI HEAD  09/12/2015  Two small acute infarcts right mid to posterior frontal lobe (bordering the parietal lobe) in a parasagittal distribution. This raises possibility of watershed infarcts. Small acute infarct anterior medial left frontal lobe. No intracranial hemorrhage. Moderate to marked small vessel disease changes. Global atrophy without hydrocephalus. No intracranial mass lesion noted on this unenhanced exam. Cervical spondylotic changes with spinal stenosis and cord flattening C3-4 level. This can be evaluated with cervical spine MR if clinically desired. Decreased signal intensity of bone marrow may be related to patient's habitus. 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 Tiny aneurysm arising from the anterior communicating artery region directed posteriorly and to the right may be present versus residua of the distal A1 segment of the right anterior cerebral artery. Right vertebral artery is dominant without significant stenosis. Narrowing and irregularity of the distal left vertebral artery. Ectatic basilar artery with slight irregularity and minimal narrowing without high-grade  stenosis. Posterior circulation branch vessel irregularity as noted above.    PHYSICAL EXAM Pleasant middle-aged African-American male not in distress. . Afebrile. Head is nontraumatic. Neck is supple without bruit.    Cardiac exam no murmur or gallop. Lungs are clear to auscultation. Distal pulses are well felt. Neurological Exam :  Awake alert oriented x 3 normal speech and language. Mild left lower face asymmetry. Tongue midline. No drift. Mild diminished fine  finger movements on left. Orbits right over left upper extremity. Mild left grip weak.. Normal sensation . Normal coordination. ASSESSMENT/PLAN Dennis Macias is a 65 y.o. male with history of diabetes presenting with difficulty forming his words  And gait imbalance. He did not receive IV t-PA due to symptoms resolved.   Stroke: bilateral small infarcts in R and L frontal lobes,  infarcts in setting of uncontrolled BP and glucose as well as dehydration. Infarcts felts to be secondary to small vessel disease source  Resultant  Clumsiness L hand, other sx resolved  MRI   2 Small infarcts right mid to posterior frontal lobe and small infarct, left frontal lobe.Moderate small vessel disease. Cord flattening, C3-4.  MRA  Hypoplastic A1 , otherwise no large vessel stenosis. Right vertebral is dominant.  Carotid Doppler  Bilateral: 1-39% ICA stenosis. Vertebral artery flow is antegrade 2D Echo  Left ventricle: The cavity size was normal. Wall thickness was  increased in a pattern of mild LVH. Systolic function was normal.  The estimated ejection fraction was in the range of 60% to 65%.  Wall motion was normal; there were no regional wall motion   abnormalitiesLDL 148  HgbA1c 10.7  Lovenox 40 mg sq daily for VTE prophylaxis Diet heart healthy/carb modified Room service appropriate?: Yes; Fluid consistency:: Thin Diet - low sodium heart healthy Diet - low sodium heart healthy  No antithrombotic prior to admission, now on aspirin 81 mg daily  Patient counseled to be compliant with his antithrombotic medications. Also encouraged to eat a heart healthy/diabetic diet and to exercise/walk  Ongoing aggressive stroke risk factor management  Therapy recommendations:  No PT  Disposition:  Anticipate return home  Hypertensive emergency  Blood pressure 230/120 in setting neurologic symptoms  Remains elevated, as high as 209/101 this am Permissive hypertension (OK if < 220/120) but  gradually normalize in 5-7 days  Hyperlipidemia  Home meds:  No statin  LDL 148, goal < 70  Recommend addition of statin  Continue statin at discharge  Diabetes type II  HgbA1c 10.7, goal < 7.0  Uncontrolled  IP diabetes coordinator has seen and made recommendations  Other Stroke Risk Factors  ETOH use  Other Active Problems  Right bundle-branch block  Hospital day #   [place] for Pager information 09/14/2015 12:52 PM   I have personally examined this patient, reviewed notes, independently viewed imaging studies, participated in medical decision making and plan of care. I have made any additions or clarifications directly to the above note. Agree with note above. He presented with transient speech difficulties and gait imbalance and MRI scan shows small bilateral lacunar infarcts likely related to small vessel disease. TEE is planned by primary medical team but I do not feel it is indicated as TEE findings are unlikely to impact change in therapy in etiology of patient's strokes appears to be small vessel disease with multiple uncontrolled risk factors. Recommend he take aspirin for stroke prevention daily and maintain strict control of diabetes, hypertension and hyperlipidemia. Discussed  with Dr. Eppie Gibson and answered questions. Stroke team will sign off. Follow-up as an outpatient in stroke clinic in 2 months. Kindly call for questions. Antony Contras, MD Medical Director [place] Pager: [phone] 09/14/2015 12:52 PM   To contact Stroke Continuity provider, please refer to [email or web address] After hours, contact General Neurology

## 2015-09-14 NOTE — Care Management Note (Addendum)
Case Management Note  Patient Details  Name: Dennis Macias MRN: [id number] Date of Birth: 05-28-1951  Subjective/Objective:                    Action/Plan: Patient discharging home today with self care. Pt without insurance and no PCP but is going to be followed up by Internal Medicine clinic. CM looked up patients discharge meds: glucophage, lisinopril are on the $4 list at [facility] and lipitor is $23 dollars with coupon at [facility]. CM met with the family and they are comfortable paying this amount for his medications.  Expected Discharge Date:                  Expected Discharge Plan:     In-House Referral:     Discharge planning Services     Post Acute Care Choice:    Choice offered to:     DME Arranged:    DME Agency:     HH Arranged:    HH Agency:     Status of Service:  In process, will continue to follow  Medicare Important Message Given:    Date Medicare IM Given:    Medicare IM give by:    Date Additional Medicare IM Given:    Additional Medicare Important Message give by:     If discussed at [place] of Stay Meetings, dates discussed:    Additional Comments:  Pollie Friar, RN 09/14/2015, 1:56 PM

## 2015-09-15 ENCOUNTER — Encounter (HOSPITAL_COMMUNITY): Payer: Self-pay | Admitting: Cardiology

## 2015-09-15 LAB — HEPATITIS C ANTIBODY (REFLEX): HCV Ab: 0.4 s/co ratio (ref 0.0–0.9)

## 2015-09-15 LAB — HCV COMMENT:

## 2015-09-19 ENCOUNTER — Other Ambulatory Visit: Payer: Self-pay | Admitting: *Deleted

## 2015-09-19 NOTE — Patient Outreach (Signed)
[facility]) Care Management  09/19/2015  Dennis Macias Nov 19, 1950 [id number]   EMMI-Stroke referral via dashboard report/patient answered that he was feeling worse overall on 09/16/15.  Telephone call to patient who was advised of reason for call HIPPA verification received. Patient voices that he is feeling fine. States he did not mean to answer question yes to the electronic call he received on Friday. Voices that he has not had any of the stroke symptoms-such as slurred speech, weakness in left arm or drooping of face since the day he was admitted to hospital. States he  or family member will call 911 if symptoms occur. Patient voices that he has been able to get prescriptions filled & he is taking medications as directed by doctor. States he is currently on ASA, blood pressure, diabetes, cholesterol medications at this time. Also states he started checking blood sugar yesterday after purchasing meter. States follow up appointment has been set up for 03/23 with the internal medicine clinic at [facility]. States family will member will provide transportation to [place] appointments.   States he received instructions at discharge also to follow with stroke clinic in 2 months and he will make appointment.  Patient voices understanding of discharge instructions. No difficulty with medications, transportation or identification of stroke symptoms . Knows action plan if stroke symptoms occur.   Plan: EMMI stroke consult completed and will be closed out. Send to care management assistant to close out.  Sherrin Daisy, RN BSN [place] Management Coordinator [facility] Care Management  [phone]

## 2015-09-29 ENCOUNTER — Encounter: Payer: Self-pay | Admitting: Internal Medicine

## 2015-09-29 ENCOUNTER — Ambulatory Visit (INDEPENDENT_AMBULATORY_CARE_PROVIDER_SITE_OTHER): Payer: Self-pay | Admitting: Internal Medicine

## 2015-09-29 VITALS — BP 133/82 | HR 78 | Temp 98.0°F | Ht 72.0 in | Wt 170.8 lb

## 2015-09-29 DIAGNOSIS — I10 Essential (primary) hypertension: Secondary | ICD-10-CM

## 2015-09-29 DIAGNOSIS — E118 Type 2 diabetes mellitus with unspecified complications: Secondary | ICD-10-CM

## 2015-09-29 DIAGNOSIS — E1165 Type 2 diabetes mellitus with hyperglycemia: Secondary | ICD-10-CM

## 2015-09-29 DIAGNOSIS — Z7982 Long term (current) use of aspirin: Secondary | ICD-10-CM

## 2015-09-29 DIAGNOSIS — I160 Hypertensive urgency: Secondary | ICD-10-CM

## 2015-09-29 DIAGNOSIS — IMO0002 Reserved for concepts with insufficient information to code with codable children: Secondary | ICD-10-CM

## 2015-09-29 DIAGNOSIS — Z79899 Other long term (current) drug therapy: Secondary | ICD-10-CM

## 2015-09-29 DIAGNOSIS — Z7984 Long term (current) use of oral hypoglycemic drugs: Secondary | ICD-10-CM

## 2015-09-29 DIAGNOSIS — Z8673 Personal history of transient ischemic attack (TIA), and cerebral infarction without residual deficits: Secondary | ICD-10-CM

## 2015-09-29 DIAGNOSIS — I639 Cerebral infarction, unspecified: Secondary | ICD-10-CM

## 2015-09-29 LAB — GLUCOSE, CAPILLARY: Glucose-Capillary: 179 mg/dL — ABNORMAL HIGH (ref 65–99)

## 2015-09-29 MED ORDER — LISINOPRIL 20 MG PO TABS: 20.0000 mg | ORAL_TABLET | Freq: Every day | ORAL | Status: DC

## 2015-09-29 MED ORDER — METFORMIN HCL 500 MG PO TABS: 500.0000 mg | ORAL_TABLET | Freq: Two times a day (BID) | ORAL | Status: DC

## 2015-09-29 NOTE — Progress Notes (Signed)
   Subjective:   Patient ID: Dennis Macias male   DOB: 1950/10/18 65 y.o.   MRN: [id number]  HPI: [name] is a 65 y.o. male w/ PMHx of DM type II, HTN, presents to the clinic today for a hospital follow-up visit after having a stroke. Patient initially had slurred speech but had resolution of symptoms prior to arrival at the ED. BP was severely elevated and MRI showed  two small acute infarcts in the right mid to posterior frontal lobe and small acute infarct in the  anterior medial left frontal lobe. Patient was also found to have elevated cholesterol and HbA1c, started on BP medication (lisinopril), Metformin, ASA, and Lipitor.   Today, the patient is doing well. No significant complaints. Says he is tolerating his medications, no further weakness, numbness, slurred speech, confusion, or facial droop. Has been taking all of his meds. No chest pain, SOB, or dizziness.   Past Medical History  Diagnosis Date  . Diabetes ([place])   . Hypertension    Current Outpatient Prescriptions  Medication Sig Dispense Refill  . acetaminophen (TYLENOL) 325 MG tablet Take 650 mg by mouth every 6 (six) hours as needed for mild pain.    Marland Kitchen aspirin 81 MG tablet Take 1 tablet (81 mg total) by mouth daily. 30 tablet 3  . atorvastatin (LIPITOR) 80 MG tablet Take 1 tablet (80 mg total) by mouth daily at 6 PM. 30 tablet 1  . lisinopril (PRINIVIL,ZESTRIL) 10 MG tablet Take 1 tablet (10 mg total) by mouth daily. 30 tablet 1  . metFORMIN (GLUCOPHAGE) 500 MG tablet Take 1 tablet (500 mg total) by mouth daily with supper. 30 tablet 1   No current facility-administered medications for this visit.    Review of Systems: General: Denies fever, chills, diaphoresis, appetite change and fatigue.  Respiratory: Denies SOB, DOE, cough, and wheezing.   Cardiovascular: Denies chest pain and palpitations.  Gastrointestinal: Denies nausea, vomiting, abdominal pain, and diarrhea.  Genitourinary: Denies dysuria, increased  frequency, and flank pain. Endocrine: Denies hot or cold intolerance, polyuria, and polydipsia. Musculoskeletal: Denies myalgias, back pain, joint swelling, arthralgias and gait problem.  Skin: Denies pallor, rash and wounds.  Neurological: Denies dizziness, seizures, syncope, weakness, lightheadedness, numbness and headaches.  Psychiatric/Behavioral: Denies mood changes, and sleep disturbances.  Objective:   Physical Exam: Filed Vitals:   09/29/15 1029  BP: 133/82  Pulse: 78  Temp: 98 F (36.7 C)  TempSrc: Oral  Height: 6' (1.829 m)  Weight: 170 lb 12.8 oz (77.474 kg)  SpO2: 100%    General: Alert, cooperative, NAD. HEENT: PERRL, EOMI. Moist mucus membranes Neck: Full range of motion without pain, supple, no lymphadenopathy or carotid bruits Lungs: Clear to ascultation bilaterally, normal work of respiration, no wheezes, rales, rhonchi Heart: RRR, no murmurs, gallops, or rubs Abdomen: Soft, non-tender, non-distended, BS + Extremities: No cyanosis, clubbing, or edema Neurologic: Alert & oriented X3, cranial nerves II-XII intact, strength grossly intact, sensation intact to light touch   Assessment & Plan:   Please see problem based assessment and plan.

## 2015-09-29 NOTE — Patient Instructions (Signed)
1. Please make a follow up appointment for 6 weeks.   2. Please take all medications as previously prescribed with the following changes:  Increase Lisinopril to 20 mg daily. You can take two of the 10 mg tablets at once until you run out of your current prescription, then take one of the 20 mg tablets daily.   Take Metformin 500 mg twice daily.   Continue to take the Lipitor and Aspirin as previously prescribed. See attached medication list.   I will call you if there are any lab abnormalities.   3. If you have worsening of your symptoms or new symptoms arise, please call the clinic [phone]), or go to the ER immediately if symptoms are severe.  IT IS IMPERATIVE THAT YOU TAKE ALL OF YOUR MEDICATIONS EVERY DAY.

## 2015-09-30 LAB — BMP8+ANION GAP
Anion Gap: 17 mmol/L (ref 10.0–18.0)
BUN/Creatinine Ratio: 15 (ref 10–22)
BUN: 14 mg/dL (ref 8–27)
CO2: 23 mmol/L (ref 18–29)
Calcium: 9.6 mg/dL (ref 8.6–10.2)
Chloride: 99 mmol/L (ref 96–106)
Creatinine, Ser: 0.94 mg/dL (ref 0.76–1.27)
GFR calc Af Amer: 99 mL/min/{1.73_m2} (ref >59)
GFR calc non Af Amer: 85 mL/min/{1.73_m2} (ref >59)
Glucose: 220 mg/dL — ABNORMAL HIGH (ref 65–99)
Potassium: 4.4 mmol/L (ref 3.5–5.2)
Sodium: 139 mmol/L (ref 134–144)

## 2015-09-30 NOTE — Assessment & Plan Note (Signed)
Recent slurred speech, taken to the ED, resolution of symptoms prior to arrival. BP severely elevated, MRI suggestive of multiple small infarcts. Patient feeling well today, no weakness, numbness, dysphagia, facial droop, or confusion. Understands importance of medication compliance.  -Continue ASA, Lipitor -HTN, DM type II control

## 2015-09-30 NOTE — Assessment & Plan Note (Signed)
BP controlled today, although, wife shows multiple blood pressures still in the 150-160 average SBP range. SBP 130's today. Repeat BMP shows normal Cr.   -Increase Lisinopril to 20 mg daily -RTC in 6 weeks

## 2015-09-30 NOTE — Assessment & Plan Note (Signed)
Lab Results  Component Value Date   HGBA1C 10.7* 09/12/2015     Assessment: Comments: Recently started on Metformin 500 mg daily. Says he is tolerating this. Sometimes has a little diarrhea. CBG's improved at home per wife.   Plan: Medications:  Increase Metformin to 500 mg bid Home glucose monitoring: Frequency:  qd Timing:  in AM Instruction/counseling given: reminded to get eye exam, reminded to bring blood glucose meter & log to each visit, reminded to bring medications to each visit, discussed foot care and discussed diet Other plans: RTC in 6 weeks

## 2015-09-30 NOTE — Progress Notes (Signed)
Internal Medicine Clinic Attending  Case discussed with Dr. Bost at the time of the visit.  We reviewed the resident's history and exam and pertinent patient test results.  I agree with the assessment, diagnosis, and plan of care documented in the resident's note.  

## 2015-10-05 ENCOUNTER — Ambulatory Visit: Payer: Self-pay

## 2015-10-18 ENCOUNTER — Telehealth: Payer: Self-pay | Admitting: Internal Medicine

## 2015-10-18 NOTE — Telephone Encounter (Signed)
APPT. REMINDER CALL, NO ANSWER, NO VOICE MAIL °

## 2015-10-19 ENCOUNTER — Ambulatory Visit: Payer: Self-pay

## 2015-11-05 ENCOUNTER — Other Ambulatory Visit: Payer: Self-pay | Admitting: Internal Medicine

## 2015-11-09 ENCOUNTER — Telehealth: Payer: Self-pay | Admitting: Internal Medicine

## 2015-11-09 NOTE — Telephone Encounter (Signed)
APT. REMINDER CALL, NO ANSWER, NO VOICEMAIL °

## 2015-11-10 ENCOUNTER — Encounter: Payer: Self-pay | Admitting: Internal Medicine

## 2015-11-10 ENCOUNTER — Ambulatory Visit (INDEPENDENT_AMBULATORY_CARE_PROVIDER_SITE_OTHER): Payer: Self-pay | Admitting: Internal Medicine

## 2015-11-10 VITALS — BP 155/98 | HR 78 | Temp 98.5°F | Ht 72.0 in | Wt 170.0 lb

## 2015-11-10 DIAGNOSIS — Z8673 Personal history of transient ischemic attack (TIA), and cerebral infarction without residual deficits: Secondary | ICD-10-CM

## 2015-11-10 DIAGNOSIS — Z79899 Other long term (current) drug therapy: Secondary | ICD-10-CM

## 2015-11-10 DIAGNOSIS — I10 Essential (primary) hypertension: Secondary | ICD-10-CM

## 2015-11-10 DIAGNOSIS — IMO0002 Reserved for concepts with insufficient information to code with codable children: Secondary | ICD-10-CM

## 2015-11-10 DIAGNOSIS — Z7984 Long term (current) use of oral hypoglycemic drugs: Secondary | ICD-10-CM

## 2015-11-10 DIAGNOSIS — E785 Hyperlipidemia, unspecified: Secondary | ICD-10-CM

## 2015-11-10 DIAGNOSIS — E1165 Type 2 diabetes mellitus with hyperglycemia: Secondary | ICD-10-CM

## 2015-11-10 DIAGNOSIS — E118 Type 2 diabetes mellitus with unspecified complications: Principal | ICD-10-CM

## 2015-11-10 LAB — GLUCOSE, CAPILLARY: Glucose-Capillary: 268 mg/dL — ABNORMAL HIGH (ref 65–99)

## 2015-11-10 MED ORDER — LISINOPRIL 20 MG PO TABS: 20.0000 mg | ORAL_TABLET | Freq: Every day | ORAL | Status: DC

## 2015-11-10 MED ORDER — METFORMIN HCL 1000 MG PO TABS: 1000.0000 mg | ORAL_TABLET | Freq: Two times a day (BID) | ORAL | Status: DC

## 2015-11-10 MED ORDER — ATORVASTATIN CALCIUM 80 MG PO TABS: 80.0000 mg | ORAL_TABLET | Freq: Every day | ORAL | Status: DC

## 2015-11-10 NOTE — Progress Notes (Signed)
   Subjective:    Patient ID: Dennis Macias, male    DOB: March 17, 1951, 65 y.o.   MRN: [id number]  HPI Dennis Macias is a 65 year old male with history of CVA, poorly controlled type 2 diabetes, hyperlipidemia who presents today for blood pressure recheck. Please see assessment & plan for status of chronic medical problems.    Review of Systems  Eyes: Negative for visual disturbance.  Respiratory: Negative for shortness of breath.   Cardiovascular: Negative for chest pain.  Neurological: Negative for dizziness, syncope and headaches.       Objective:   Physical Exam  Constitutional: He is oriented to person, place, and time. He appears well-developed and well-nourished. No distress.  HENT:  Head: Normocephalic and atraumatic.  Eyes: Conjunctivae are normal. No scleral icterus.  Neck: No tracheal deviation present.  Cardiovascular: Normal rate and regular rhythm.   No carotid bruits  Pulmonary/Chest: Effort normal and breath sounds normal. No stridor. No respiratory distress.  Neurological: He is alert and oriented to person, place, and time.  Mildly dysarthric  Skin: Skin is warm and dry. He is not diaphoretic.          Assessment & Plan:

## 2015-11-10 NOTE — Patient Instructions (Signed)
For diabetes, please take metformin two tablets in the morning and afternoon. Once you run out, please take one tablet in the morning and afternoon.  For blood pressure, please bring in your log at your next visit and continue taking lisinopril 20mg  daily.  For cholesterol, please continue taking atorvastatin 80mg  at 6pm.

## 2015-11-11 NOTE — Assessment & Plan Note (Signed)
Overview Last A1c 10.7, March 2017. He is currently taking a pill twice daily which she thinks is metformin 500 mg though cannot confirm the absence of having the medications with him today. He does report occasional diarrhea denies other GI side effects, like abdominal pain, nausea, vomiting.  Assessment Poorly controlled type 2 diabetes without end organ complications. Glycemic target for him should be A1c less than 7 given recent CVA.  Plan -Increase metformin to 1000 mg twice daily with plan to reassess with PCP next month and A1c recheck -Defer augmentation of his diabetic regimen until he is able to complete paperwork for Medicare coverage which he reports he will do in the next few months

## 2015-11-11 NOTE — Assessment & Plan Note (Signed)
Overview His initial blood pressure is 171/82, elevated from back in March. A neurologist that he is taking a medication the morning which he thinks is lisinopril 20 mg daily. He denies any symptoms of hypotension, like dizziness, lightheadedness, hypertensive urgency, like headache, blurry vision, chest pain, shortness of breath. He does have a log at home which he has not brought with him today, and he reports that the values for the top number trend 90s to 100s and the bottom numbers trend 80s to 90s.  Assessment Hypertension, previously well-controlled on monotherapy. Given his recent CVA, blood pressure goal for him would be 140/90 for secondary prevention. He does have room to titrate up his medication if necessary.  Plan -Refilled lisinopril 20 mg daily with plan to reassess blood pressure follow-up and increase dose to 40 mg as necessary to which he is agreeable -Encouraged him to bring his blood pressure log

## 2015-11-11 NOTE — Progress Notes (Signed)
Internal Medicine Clinic Attending  Case discussed with Dr. Patel,Rushil at the time of the visit.  We reviewed the resident's history and exam and pertinent patient test results.  I agree with the assessment, diagnosis, and plan of care documented in the resident's note.  

## 2015-11-11 NOTE — Assessment & Plan Note (Signed)
Overview He reports he has been taking a medication at 6:00 every night which is most likely Lipitor 80 mg though he cannot confirm completely without his medications. He denies any symptoms of myalgias or intolerance with this medication.  Assessment Dyslipidemia on high intensity statin therapy for secondary prevention of CVA. To assess response to therapy, he needs to have a subsequent lipid panel though he is without coverage today.  Plan -Repeat lipid panel once he applies for Medicare in a few months -Provided patient with Good Rx card to offset the cost of his medications -Refilled Lipitor 80mg  daily

## 2015-11-17 ENCOUNTER — Telehealth: Payer: Self-pay | Admitting: Dietician

## 2015-11-17 NOTE — Telephone Encounter (Signed)
Scheduled for Wednesday 11/23/15 at 3:30 PM per his request

## 2015-11-18 ENCOUNTER — Other Ambulatory Visit: Payer: Self-pay | Admitting: Internal Medicine

## 2015-11-18 DIAGNOSIS — E1165 Type 2 diabetes mellitus with hyperglycemia: Secondary | ICD-10-CM

## 2015-11-18 DIAGNOSIS — E118 Type 2 diabetes mellitus with unspecified complications: Principal | ICD-10-CM

## 2015-11-18 DIAGNOSIS — IMO0002 Reserved for concepts with insufficient information to code with codable children: Secondary | ICD-10-CM

## 2015-11-23 ENCOUNTER — Ambulatory Visit (INDEPENDENT_AMBULATORY_CARE_PROVIDER_SITE_OTHER): Payer: Self-pay | Admitting: Dietician

## 2015-11-23 ENCOUNTER — Other Ambulatory Visit: Payer: Self-pay | Admitting: Oncology

## 2015-11-23 ENCOUNTER — Telehealth: Payer: Self-pay | Admitting: Pharmacist

## 2015-11-23 VITALS — Ht 72.0 in | Wt 166.9 lb

## 2015-11-23 DIAGNOSIS — E119 Type 2 diabetes mellitus without complications: Secondary | ICD-10-CM

## 2015-11-23 DIAGNOSIS — E118 Type 2 diabetes mellitus with unspecified complications: Principal | ICD-10-CM

## 2015-11-23 DIAGNOSIS — IMO0002 Reserved for concepts with insufficient information to code with codable children: Secondary | ICD-10-CM

## 2015-11-23 DIAGNOSIS — E1165 Type 2 diabetes mellitus with hyperglycemia: Secondary | ICD-10-CM

## 2015-11-23 LAB — GLUCOSE, CAPILLARY: Glucose-Capillary: 179 mg/dL — ABNORMAL HIGH (ref 65–99)

## 2015-11-23 NOTE — Progress Notes (Signed)
Diabetes Self-Management Education  Visit Type: First/Initial  Appt. Start Time: 1530 Appt. End Time: 1625  11/23/2015  [name], identified by name and date of birth, is a 65 y.o. male with a diagnosis of Diabetes: Type 2.   ASSESSMENT  [name] has lost 11.1# in last month. He is not trying to lose weight but is cutting back and changing his diet to help control his blood sugars. He was encouraged to eat more healthy foods to deter further weight loss. I beleive that after 15 years he is insulin deficient and would benefit from insulin or a medicine to assist with increased insulin production like glipizide.  He says he does not qualify for the orange card, his medications were affordable except the lipitor and his test strips were 50$. I gave him a discount card for his test strips today and will send the pharmacist a message about his lipitor.   Height 6' (1.829 m), weight 166 lb 14.4 oz (75.705 kg). Body mass index is 22.63 kg/(m^2).      Diabetes Self-Management Education - 11/23/15 1600    Visit Information   Visit Type First/Initial   Initial Visit   Diabetes Type Type 2   Are you currently following a meal plan? Yes   What type of meal plan do you follow? no sweets   Are you taking your medications as prescribed? Yes   Date Diagnosed 2002   Health Coping   How would you rate your overall health? Good   Psychosocial Assessment   Patient Belief/Attitude about Diabetes Motivated to manage diabetes   Self-care barriers Lack of material resources   Self-management support Doctor's office;Family;CDE visits   Patient Concerns Healthy Lifestyle   Special Needs None   Preferred Learning Style No preference indicated   Learning Readiness Ready   How often do you need to have someone help you when you read instructions, pamphlets, or other written materials from your doctor or pharmacy? 3 - Sometimes   What is the last grade level you completed in school? --  12   Pre-Education Assessment   Patient understands the diabetes disease and treatment process. Demonstrates understanding / competency   Patient understands incorporating nutritional management into lifestyle. Demonstrates understanding / competency   Patient undertands incorporating physical activity into lifestyle. Demonstrates understanding / competency   Patient understands using medications safely. Needs Instruction   Patient understands monitoring blood glucose, interpreting and using results Needs Instruction   Patient understands prevention, detection, and treatment of acute complications. Demonstrates understanding / competency   Patient understands prevention, detection, and treatment of chronic complications. Needs Review   Patient understands how to develop strategies to address psychosocial issues. Demonstrates understanding / competency   Patient understands how to develop strategies to promote health/change behavior. Demonstrates understanding / competency   Complications   Last HgB A1C per patient/outside source 10.7 %   How often do you check your blood sugar? 1-2 times/day   Fasting Blood glucose range (mg/dL) 180-200;>200   Number of hypoglycemic episodes per month 0   Number of hyperglycemic episodes per week --  most readings including fasting are > 180 mg/dl   Have you had a dilated eye exam in the past 12 months? No   Have you had a dental exam in the past 12 months? No   Are you checking your feet? No   Dietary Intake   Breakfast instant grits   Lunch fiber one bar and liver pudding   Dinner  peas, potaotes, hamburger,   Beverage(s) water or diet Mt Dew or juice   Exercise   Exercise Type ADL's;Light (walking / raking leaves)   Patient Education   Previous Diabetes Education No   Nutrition management  Role of diet in the treatment of diabetes and the relationship between the three main macronutrients and blood glucose level   Medications Reviewed patients medication  for diabetes, action, purpose, timing of dose and side effects.   Monitoring Purpose and frequency of SMBG.;Identified appropriate SMBG and/or A1C goals.   Individualized Goals (developed by patient)   Nutrition Eat regular grits instead of instant for lower sodium content to help your blood pressure   Outcomes   Expected Outcomes Demonstrated interest in learning. Expect positive outcomes   Future DMSE 6 months- patient should have medicare at that point and coverage for DSMT and MNT   Program Status Completed      Individualized Plan for Diabetes Self-Management Training:   Learning Objective:  Patient will have a greater understanding of diabetes self-management. Patient education plan: Nutrition, medication & self monitoring.   Plan:   Patient Instructions  Dr. Antony Contras is at [facility] [address], [phone]   Use regular grits instead of instant because they are lower in sodium  Sodium increases your blood pressure.   Low-Sodium Eating Plan Sodium raises blood pressure and causes water to be held in the body. Getting less sodium from food will help lower your blood pressure, reduce any swelling, and protect your heart, liver, and kidneys. We get sodium by adding salt (sodium chloride) to food. Most of our sodium comes from canned, boxed, and frozen foods. Restaurant foods, fast foods, and pizza are also very high in sodium. Even if you take medicine to lower your blood pressure or to reduce fluid in your body, getting less sodium from your food is important. WHAT IS MY PLAN? Most people should limit their sodium intake to 2,300 mg a day. Your health care provider recommends that you limit your sodium intake to __________ a day.  WHAT DO I NEED TO KNOW ABOUT THIS EATING PLAN? For the low-sodium eating plan, you will follow these general guidelines:  Choose foods with a % Daily Value for sodium of less than 5% (as listed on the food label).    Use salt-free seasonings or herbs instead of table salt or sea salt.   Check with your health care provider or pharmacist before using salt substitutes.   Eat fresh foods.  Eat more vegetables and fruits.  Limit canned vegetables. If you do use them, rinse them well to decrease the sodium.   Limit cheese to 1 oz (28 g) per day.   Eat lower-sodium products, often labeled as "lower sodium" or "no salt added."  Avoid foods that contain monosodium glutamate (MSG). MSG is sometimes added to [place] food and some canned foods.  Check food labels (Nutrition Facts labels) on foods to learn how much sodium is in one serving.  Eat more home-cooked food and less restaurant, buffet, and fast food.  When eating at a restaurant, ask that your food be prepared with less salt, or no salt if possible.  HOW DO I READ FOOD LABELS FOR SODIUM INFORMATION? The Nutrition Facts label lists the amount of sodium in one serving of the food. If you eat more than one serving, you must multiply the listed amount of sodium by the number of servings. Food labels may also identify foods as:  Sodium free--Less than 5 mg in a serving.  Very low sodium--35 mg or less in a serving.  Low sodium--140 mg or less in a serving.  Light in sodium--50% less sodium in a serving. For example, if a food that usually has 300 mg of sodium is changed to become light in sodium, it will have 150 mg of sodium.  Reduced sodium--25% less sodium in a serving. For example, if a food that usually has 400 mg of sodium is changed to reduced sodium, it will have 300 mg of sodium. WHAT FOODS CAN I EAT? Grains Low-sodium cereals, including oats, puffed wheat and rice, and shredded wheat cereals. Low-sodium crackers. Unsalted rice and pasta. Lower-sodium bread.  Vegetables Frozen or fresh vegetables. Low-sodium or reduced-sodium canned vegetables. Low-sodium or reduced-sodium tomato sauce and paste. Low-sodium or  reduced-sodium tomato and vegetable juices.  Fruits Fresh, frozen, and canned fruit. Fruit juice.  Meat and Other Protein Products Low-sodium canned tuna and salmon. Fresh or frozen meat, poultry, seafood, and fish. Lamb. Unsalted nuts. Dried beans, peas, and lentils without added salt. Unsalted canned beans. Homemade soups without salt. Eggs.  Dairy Milk. Soy milk. Ricotta cheese. Low-sodium or reduced-sodium cheeses. Yogurt.  Condiments Fresh and dried herbs and spices. Salt-free seasonings. Onion and garlic powders. Low-sodium varieties of mustard and ketchup. Fresh or refrigerated horseradish. Lemon juice.  Fats and Oils Reduced-sodium salad dressings. Unsalted butter.  Other Unsalted popcorn and pretzels.  The items listed above may not be a complete list of recommended foods or beverages. Contact your dietitian for more options. WHAT FOODS ARE NOT RECOMMENDED? Grains Instant hot cereals. Bread stuffing, pancake, and biscuit mixes. Croutons. Seasoned rice or pasta mixes. Noodle soup cups. Boxed or frozen macaroni and cheese. Self-rising flour. Regular salted crackers. Vegetables Regular canned vegetables. Regular canned tomato sauce and paste. Regular tomato and vegetable juices. Frozen vegetables in sauces. Salted [place] fries. Olives. Angie Fava. Relishes. Sauerkraut. Salsa. Meat and Other Protein Products Salted, canned, smoked, spiced, or pickled meats, seafood, or fish. Bacon, ham, sausage, hot dogs, corned beef, chipped beef, and packaged luncheon meats. Salt pork. Jerky. Pickled herring. Anchovies, regular canned tuna, and sardines. Salted nuts. Dairy Processed cheese and cheese spreads. Cheese curds. Blue cheese and cottage cheese. Buttermilk.  Condiments Onion and garlic salt, seasoned salt, table salt, and sea salt. Canned and packaged gravies. Worcestershire sauce. Tartar sauce. Barbecue sauce. Teriyaki sauce. Soy sauce, including reduced sodium. Steak sauce. Fish  sauce. Oyster sauce. Cocktail sauce. Horseradish that you find on the shelf. Regular ketchup and mustard. Meat flavorings and tenderizers. Bouillon cubes. Hot sauce. Tabasco sauce. Marinades. Taco seasonings. Relishes. Fats and Oils Regular salad dressings. Salted butter. Margarine. Ghee. Bacon fat.  Other Potato and tortilla chips. Corn chips and puffs. Salted popcorn and pretzels. Canned or dried soups. Pizza. Frozen entrees and pot pies.  The items listed above may not be a complete list of foods and beverages to avoid. Contact your dietitian for more information.    Butch Penny   [phone]      Expected Outcomes:  Demonstrated interest in learning. Expect positive outcomes Education material provided: No sodium seasonings If problems or questions, patient to contact team via:  Phone  Future DSME appointment: 6 months

## 2015-11-23 NOTE — Patient Instructions (Signed)
Dr. Antony Contras is at [facility] [address] [phone]   Use regular grits instead of instant because they are lower in sodium  Sodium increases your blood pressure.   Low-Sodium Eating Plan Sodium raises blood pressure and causes water to be held in the body. Getting less sodium from food will help lower your blood pressure, reduce any swelling, and protect your heart, liver, and kidneys. We get sodium by adding salt (sodium chloride) to food. Most of our sodium comes from canned, boxed, and frozen foods. Restaurant foods, fast foods, and pizza are also very high in sodium. Even if you take medicine to lower your blood pressure or to reduce fluid in your body, getting less sodium from your food is important. WHAT IS MY PLAN? Most people should limit their sodium intake to 2,300 mg a day. Your health care provider recommends that you limit your sodium intake to __________ a day.  WHAT DO I NEED TO KNOW ABOUT THIS EATING PLAN? For the low-sodium eating plan, you will follow these general guidelines:  Choose foods with a % Daily Value for sodium of less than 5% (as listed on the food label).   Use salt-free seasonings or herbs instead of table salt or sea salt.   Check with your health care provider or pharmacist before using salt substitutes.   Eat fresh foods.  Eat more vegetables and fruits.  Limit canned vegetables. If you do use them, rinse them well to decrease the sodium.   Limit cheese to 1 oz (28 g) per day.   Eat lower-sodium products, often labeled as "lower sodium" or "no salt added."  Avoid foods that contain monosodium glutamate (MSG). MSG is sometimes added to [place] food and some canned foods.  Check food labels (Nutrition Facts labels) on foods to learn how much sodium is in one serving.  Eat more home-cooked food and less restaurant, buffet, and fast food.  When eating at a restaurant, ask that your food be prepared with less  salt, or no salt if possible.  HOW DO I READ FOOD LABELS FOR SODIUM INFORMATION? The Nutrition Facts label lists the amount of sodium in one serving of the food. If you eat more than one serving, you must multiply the listed amount of sodium by the number of servings. Food labels may also identify foods as:  Sodium free--Less than 5 mg in a serving.  Very low sodium--35 mg or less in a serving.  Low sodium--140 mg or less in a serving.  Light in sodium--50% less sodium in a serving. For example, if a food that usually has 300 mg of sodium is changed to become light in sodium, it will have 150 mg of sodium.  Reduced sodium--25% less sodium in a serving. For example, if a food that usually has 400 mg of sodium is changed to reduced sodium, it will have 300 mg of sodium. WHAT FOODS CAN I EAT? Grains Low-sodium cereals, including oats, puffed wheat and rice, and shredded wheat cereals. Low-sodium crackers. Unsalted rice and pasta. Lower-sodium bread.  Vegetables Frozen or fresh vegetables. Low-sodium or reduced-sodium canned vegetables. Low-sodium or reduced-sodium tomato sauce and paste. Low-sodium or reduced-sodium tomato and vegetable juices.  Fruits Fresh, frozen, and canned fruit. Fruit juice.  Meat and Other Protein Products Low-sodium canned tuna and salmon. Fresh or frozen meat, poultry, seafood, and fish. Lamb. Unsalted nuts. Dried beans, peas, and lentils without added salt. Unsalted canned beans. Homemade soups without salt. Eggs.  Dairy  Milk. Soy milk. Ricotta cheese. Low-sodium or reduced-sodium cheeses. Yogurt.  Condiments Fresh and dried herbs and spices. Salt-free seasonings. Onion and garlic powders. Low-sodium varieties of mustard and ketchup. Fresh or refrigerated horseradish. Lemon juice.  Fats and Oils Reduced-sodium salad dressings. Unsalted butter.  Other Unsalted popcorn and pretzels.  The items listed above may not be a complete list of recommended foods  or beverages. Contact your dietitian for more options. WHAT FOODS ARE NOT RECOMMENDED? Grains Instant hot cereals. Bread stuffing, pancake, and biscuit mixes. Croutons. Seasoned rice or pasta mixes. Noodle soup cups. Boxed or frozen macaroni and cheese. Self-rising flour. Regular salted crackers. Vegetables Regular canned vegetables. Regular canned tomato sauce and paste. Regular tomato and vegetable juices. Frozen vegetables in sauces. Salted [place] fries. Olives. Angie Fava. Relishes. Sauerkraut. Salsa. Meat and Other Protein Products Salted, canned, smoked, spiced, or pickled meats, seafood, or fish. Bacon, ham, sausage, hot dogs, corned beef, chipped beef, and packaged luncheon meats. Salt pork. Jerky. Pickled herring. Anchovies, regular canned tuna, and sardines. Salted nuts. Dairy Processed cheese and cheese spreads. Cheese curds. Blue cheese and cottage cheese. Buttermilk.  Condiments Onion and garlic salt, seasoned salt, table salt, and sea salt. Canned and packaged gravies. Worcestershire sauce. Tartar sauce. Barbecue sauce. Teriyaki sauce. Soy sauce, including reduced sodium. Steak sauce. Fish sauce. Oyster sauce. Cocktail sauce. Horseradish that you find on the shelf. Regular ketchup and mustard. Meat flavorings and tenderizers. Bouillon cubes. Hot sauce. Tabasco sauce. Marinades. Taco seasonings. Relishes. Fats and Oils Regular salad dressings. Salted butter. Margarine. Ghee. Bacon fat.  Other Potato and tortilla chips. Corn chips and puffs. Salted popcorn and pretzels. Canned or dried soups. Pizza. Frozen entrees and pot pies.  The items listed above may not be a complete list of foods and beverages to avoid. Contact your dietitian for more information.    Butch Penny   [phone]

## 2015-11-23 NOTE — Telephone Encounter (Addendum)
Patient called with questions regarding cost of atorvastatin. Provided education on the benefits of high-intensity statin therapy if affordable/tolerable and patient is able/willing to pay for now. Advised patient to contact clinic if any further concerns arise. Patient verbalized understanding of information by repeat back.

## 2015-11-28 ENCOUNTER — Encounter: Payer: Self-pay | Admitting: Neurology

## 2015-11-28 ENCOUNTER — Ambulatory Visit (INDEPENDENT_AMBULATORY_CARE_PROVIDER_SITE_OTHER): Payer: Self-pay | Admitting: Neurology

## 2015-11-28 VITALS — BP 157/89 | HR 84 | Ht 72.0 in | Wt 167.8 lb

## 2015-11-28 DIAGNOSIS — I6381 Other cerebral infarction due to occlusion or stenosis of small artery: Secondary | ICD-10-CM

## 2015-11-28 DIAGNOSIS — I639 Cerebral infarction, unspecified: Secondary | ICD-10-CM

## 2015-11-28 NOTE — Progress Notes (Signed)
Dennis Macias [address] [place]. [place] [address] [phone]       OFFICE FOLLOW-UP NOTE  [name] Date of Birth:  [birth year] Medical Record Number:  [id number]   HPI: 65 year old Caucasian male seen today for first office follow-up visit following hospital admission for stroke in March 2017.Dennis Macias is an 65 y.o. male who has Hx DM and was with his family. At 1310 today, 09/12/2015 he noted sudden onset of difficulty forming his words and some imbalance. HE was brought to [facility] as code stroke. BP 230/120 but symptoms had resolved. EKG showing possible ST elevation. CT head negative for stroke. Patient placed on TIA alert and given 20 mg Labetalol, his blood pressure in the ED was 223/107. He does have decreased vision in his right eye but this is old. He did not experience focal weakness no numbness involving face or extremities. Patient was not administered IV t-PA secondary to symptoms resolved. He was admitted for further evaluation and treatment. CT scan of the brain on admission showed only mild age-related changes. MRI scan showed 2 small acute lacunar infarcts in the right mid to posterior frontal lobe and a small acute infarct in the left anterior medial left frontal lobe. MRA of the brain showed no significant large vessel stenosis left terminal vertebral artery was not and irregular. LDL cholesterol is elevated at 148 and hemoglobin A1c at 10.7. Transesophageal echocardiogram showed normal ejection fraction of 60-65% and no cardiac source of embolism or PFO. Carotid Doppler showed no significant extracranial stenosis. Patient was started on aspirin for stroke prevention and Lipitor. He states his done well since discharge his had no recurrent with recurrent stroke or TIA symptoms and was made a full recovery. He states his blood pressure continues to fluctuate a lot and is usually in the 170 range. Today it is [phone]. He has been seen at his medical follow-up clinic  and medications are being adjusted. His fasting sugars also continued to be uncontrolled and ranged in the 160-200 range. He is tolerating Lipitor well without muscle aches or pains. Starting aspirin without bruising or bleeding. He has no new neurological complaints today.  ROS:   14 system review of systems is positive for  fatigue, hearing loss, blurred vision, cough, shortness of breath, chest pain, constipation, diarrhea, nausea, vomiting, insomnia, frequent waking, daytime sleepiness, snoring, frequency of urination, headache, anxiety and all other systems negative PMH:  Past Medical History  Diagnosis Date  . Diabetes ([place])   . Hypertension   . Stroke [facility])     Social History:  Social History   Social History  . Marital Status: Married    Spouse Name: N/A  . Number of Children: N/A  . Years of Education: N/A   Occupational History  . Not on file.   Social History Main Topics  . Smoking status: Former Research scientist (life sciences)  . Smokeless tobacco: Current User    Types: Chew  . Alcohol Use: 0.6 oz/week    1 Cans of beer per week     Comment: 1 6 pack  on occasionally   . Drug Use: No  . Sexual Activity: Not on file   Other Topics Concern  . Not on file   Social History Narrative    Medications:   Current Outpatient Prescriptions on File Prior to Visit  Medication Sig Dispense Refill  . acetaminophen (TYLENOL) 325 MG tablet Take 650 mg by mouth every 6 (six) hours as needed for mild pain.    Marland Kitchen  aspirin 81 MG tablet Take 1 tablet (81 mg total) by mouth daily. 30 tablet 3  . atorvastatin (LIPITOR) 80 MG tablet Take 1 tablet (80 mg total) by mouth daily at 6 PM. 30 tablet 11  . lisinopril (PRINIVIL,ZESTRIL) 20 MG tablet Take 1 tablet (20 mg total) by mouth daily. 30 tablet 1  . metFORMIN (GLUCOPHAGE) 1000 MG tablet Take 1 tablet (1,000 mg total) by mouth 2 (two) times daily with a meal. 60 tablet 11   No current facility-administered medications on file prior to visit.     Allergies:  No Known Allergies  Physical Exam General: well developed, well nourished middle aged caucasian male, seated, in no evident distress Head: head normocephalic and atraumatic.  Neck: supple with no carotid or supraclavicular bruits Cardiovascular: regular rate and rhythm, no murmurs Musculoskeletal: no deformity Skin:  no rash/petichiae Vascular:  Normal pulses all extremities Filed Vitals:   11/28/15 1618  BP: 157/89  Pulse: 84   Neurologic Exam Mental Status: Awake and fully alert. Oriented to place and time. Recent and remote memory intact. Attention span, concentration and fund of knowledge appropriate. Mood and affect appropriate.  Cranial Nerves: Fundoscopic exam reveals sharp  diminished  vision acuity in the right eye which is old and and normal in the left. Extraocular movements full without nystagmus. Visual fields full to confrontation. Hearing intact. Facial sensation intact. Face, tongue, palate moves normally and symmetrically.  Motor: Normal bulk and tone. Normal strength in all tested extremity muscles. Sensory.: intact to touch ,pinprick .position and vibratory sensation.  Coordination: Rapid alternating movements normal in all extremities. Finger-to-nose and heel-to-shin performed accurately bilaterally. Gait and Station: Arises from chair without difficulty. Stance is normal. Gait demonstrates normal stride length and balance . Unable to heel, toe and tandem walk without difficulty.  Reflexes: 1+ and symmetric. Toes downgoing.   NIHSS 0 Modified Rankin 0  ASSESSMENT: 54 year Caucasian male with bifrontal lacunar infarcts in March 2017 secondary to small vessel disease with vascular risk factors of hypertension, diabetes, hyperlipidemia.    PLAN: I had a long d/w patient and wife about his recent stroke, risk for recurrent stroke/TIAs, personally independently reviewed imaging studies and stroke evaluation results and answered questions.Continue  aspirin 81 mg daily  for secondary stroke prevention and maintain strict control of hypertension with blood pressure goal below 130/90, diabetes with hemoglobin A1c goal below 6.5% and lipids with LDL cholesterol goal below 70 mg/dL. I also advised the patient to eat a healthy diet with plenty of whole grains, cereals, fruits and vegetables, exercise regularly and maintain ideal body weight .I have advised him close follow-up with his medical doctor for tighter blood pressure and sugar control Followup in the future with stroke nurse practitioner in 6 months or call earlier if necessary. Greater than 50% of time during this 25 minute visit was spent on counseling,explanation of diagnosis, planning of further management, discussion with patient and family and coordination of care Antony Contras, MD  [facility] [address] [place], [place] [address]  Phone [phone] Fax [phone] Note: This document was prepared with digital dictation and possible smart phrase technology. Any transcriptional errors that result from this process are unintentional

## 2015-11-28 NOTE — Patient Instructions (Addendum)
I had a long d/w patient and wife about his recent stroke, risk for recurrent stroke/TIAs, personally independently reviewed imaging studies and stroke evaluation results and answered questions.Continue aspirin 81 mg daily  for secondary stroke prevention and maintain strict control of hypertension with blood pressure goal below 130/90, diabetes with hemoglobin A1c goal below 6.5% and lipids with LDL cholesterol goal below 70 mg/dL. I also advised the patient to eat a healthy diet with plenty of whole grains, cereals, fruits and vegetables, exercise regularly and maintain ideal body weight .I have advised him close follow-up with his medical doctor for tighter blood pressure and sugar control Followup in the future with stroke nurse practitioner in 6 months or call earlier if necessary. Stroke Prevention Some medical conditions and behaviors are associated with an increased chance of having a stroke. You may prevent a stroke by making healthy choices and managing medical conditions. HOW CAN I REDUCE MY RISK OF HAVING A STROKE?   Stay physically active. Get at least 30 minutes of activity on most or all days.  Do not smoke. It may also be helpful to avoid exposure to secondhand smoke.  Limit alcohol use. Moderate alcohol use is considered to be:  No more than 2 drinks per day for men.  No more than 1 drink per day for nonpregnant women.  Eat healthy foods. This involves:  Eating 5 or more servings of fruits and vegetables a day.  Making dietary changes that address high blood pressure (hypertension), high cholesterol, diabetes, or obesity.  Manage your cholesterol levels.  Making food choices that are high in fiber and low in saturated fat, trans fat, and cholesterol may control cholesterol levels.  Take any prescribed medicines to control cholesterol as directed by your health care provider.  Manage your diabetes.  Controlling your carbohydrate and sugar intake is recommended to manage  diabetes.  Take any prescribed medicines to control diabetes as directed by your health care provider.  Control your hypertension.  Making food choices that are low in salt (sodium), saturated fat, trans fat, and cholesterol is recommended to manage hypertension.  Ask your health care provider if you need treatment to lower your blood pressure. Take any prescribed medicines to control hypertension as directed by your health care provider.  If you are 75-62 years of age, have your blood pressure checked every 3-5 years. If you are 58 years of age or older, have your blood pressure checked every year.  Maintain a healthy weight.  Reducing calorie intake and making food choices that are low in sodium, saturated fat, trans fat, and cholesterol are recommended to manage weight.  Stop drug abuse.  Avoid taking birth control pills.  Talk to your health care provider about the risks of taking birth control pills if you are over 45 years old, smoke, get migraines, or have ever had a blood clot.  Get evaluated for sleep disorders (sleep apnea).  Talk to your health care provider about getting a sleep evaluation if you snore a lot or have excessive sleepiness.  Take medicines only as directed by your health care provider.  For some people, aspirin or blood thinners (anticoagulants) are helpful in reducing the risk of forming abnormal blood clots that can lead to stroke. If you have the irregular heart rhythm of atrial fibrillation, you should be on a blood thinner unless there is a good reason you cannot take them.  Understand all your medicine instructions.  Make sure that other conditions (such as anemia or  atherosclerosis) are addressed. SEEK IMMEDIATE MEDICAL CARE IF:   You have sudden weakness or numbness of the face, arm, or leg, especially on one side of the body.  Your face or eyelid droops to one side.  You have sudden confusion.  You have trouble speaking (aphasia) or  understanding.  You have sudden trouble seeing in one or both eyes.  You have sudden trouble walking.  You have dizziness.  You have a loss of balance or coordination.  You have a sudden, severe headache with no known cause.  You have new chest pain or an irregular heartbeat. Any of these symptoms may represent a serious problem that is an emergency. Do not wait to see if the symptoms will go away. Get medical help at once. Call your local emergency services (911 in [place] Do not drive yourself to the hospital.   This information is not intended to replace advice given to you by your health care provider. Make sure you discuss any questions you have with your health care provider.   Document Released: 08/02/2004 Document Revised: 07/16/2014 Document Reviewed: 12/26/2012 Elsevier Interactive Patient Education [employer].

## 2015-12-12 ENCOUNTER — Telehealth: Payer: Self-pay | Admitting: Licensed Clinical Social Worker

## 2015-12-12 NOTE — Telephone Encounter (Signed)
CSW placed call to pt's pharmacy to request a 6 month refill history for patient.  Requesting the history to be faxed to [phone].  Contact information provided.

## 2015-12-15 NOTE — Telephone Encounter (Signed)
Refill hx received and forwarded to [facility].

## 2015-12-22 ENCOUNTER — Encounter: Payer: Self-pay | Admitting: Internal Medicine

## 2015-12-22 ENCOUNTER — Ambulatory Visit (INDEPENDENT_AMBULATORY_CARE_PROVIDER_SITE_OTHER): Payer: Self-pay | Admitting: Internal Medicine

## 2015-12-22 VITALS — BP 139/83 | HR 89 | Temp 98.2°F | Wt 164.9 lb

## 2015-12-22 DIAGNOSIS — E118 Type 2 diabetes mellitus with unspecified complications: Principal | ICD-10-CM

## 2015-12-22 DIAGNOSIS — I10 Essential (primary) hypertension: Secondary | ICD-10-CM

## 2015-12-22 DIAGNOSIS — E785 Hyperlipidemia, unspecified: Secondary | ICD-10-CM

## 2015-12-22 DIAGNOSIS — Z7982 Long term (current) use of aspirin: Secondary | ICD-10-CM

## 2015-12-22 DIAGNOSIS — E1165 Type 2 diabetes mellitus with hyperglycemia: Secondary | ICD-10-CM

## 2015-12-22 DIAGNOSIS — Z8673 Personal history of transient ischemic attack (TIA), and cerebral infarction without residual deficits: Secondary | ICD-10-CM

## 2015-12-22 DIAGNOSIS — Z7984 Long term (current) use of oral hypoglycemic drugs: Secondary | ICD-10-CM

## 2015-12-22 DIAGNOSIS — IMO0002 Reserved for concepts with insufficient information to code with codable children: Secondary | ICD-10-CM

## 2015-12-22 DIAGNOSIS — Z79899 Other long term (current) drug therapy: Secondary | ICD-10-CM

## 2015-12-22 DIAGNOSIS — Z0000 Encounter for general adult medical examination without abnormal findings: Secondary | ICD-10-CM

## 2015-12-22 LAB — POCT GLYCOSYLATED HEMOGLOBIN (HGB A1C): Hemoglobin A1C: 8.3

## 2015-12-22 LAB — GLUCOSE, CAPILLARY: Glucose-Capillary: 182 mg/dL — ABNORMAL HIGH (ref 65–99)

## 2015-12-22 MED ORDER — METFORMIN HCL 1000 MG PO TABS: 1000.0000 mg | ORAL_TABLET | Freq: Two times a day (BID) | ORAL | Status: DC

## 2015-12-22 MED ORDER — GLIPIZIDE 5 MG PO TABS: 5.0000 mg | ORAL_TABLET | Freq: Every day | ORAL | Status: DC

## 2015-12-22 MED ORDER — LISINOPRIL 20 MG PO TABS: 20.0000 mg | ORAL_TABLET | Freq: Every day | ORAL | Status: DC

## 2015-12-22 NOTE — Patient Instructions (Signed)
Thank you for coming to see me today. It was a pleasure. Today we talked about:   Diabetes: I am starting a new medication called Glipizide 5mg .  Take this daily.  A prescription was sent to your pharmacy.  High Blood Pressure: continue watching what you eat and try to avoid too much salt.  Colon Cancer Screening: Please send back your stool cards as instructed.  Please follow-up with me in 3 months  If you have any questions or concerns, please do not hesitate to call the office at [phone].  Take Care,   Jule Ser, DO

## 2015-12-22 NOTE — Assessment & Plan Note (Signed)
Assessment/Plan: - patient given stool cards today to screen for colon cancer - needs eye and foot exam - received PPSV 23 in March 2017.  Will need PCV 13 at least 1 year after PPSV 23

## 2015-12-22 NOTE — Assessment & Plan Note (Addendum)
Assessment: Blood pressure today is 139/83 on single agent lisinopril 20mg  daily.  He reports no adverse effects and states he is adherent to his regimen.  Did not bring a home BP log.  Plan: - continue with lisinopril 20mg  daily - encouraged home BP log with 3 times per week monitoring and asked patient to bring this to his next appointment - if BP consistently elevated > 140 at follow up will consider increasing lisinopril to 40mg  or adding 2nd agent - encouraged decreased salt intake and lifestyle modifications - no need for BMET this visit as his electrolytes and kidney function were previously normal in March 2017 on lisinopril 10mg .  If we increase to 40mg  daily, will plan to recheck his BMET within 7-10 days after increasing

## 2015-12-22 NOTE — Assessment & Plan Note (Signed)
Assessment: Seen by Dr. Leonie Man (neurology) on 11/28/15.  He continues to take his aspirin and statin regularly.  He is feeling well today and without any weakness, numbness, dysphagia, facial droop, or confusion. Understands importance of medication adherence.   Plan: - continue statin and aspirin - continue other risk factor modifications (BP, DM) - follow up with neurology as scheduled

## 2015-12-22 NOTE — Assessment & Plan Note (Signed)
Assessment: Lab Results  Component Value Date   HGBA1C 8.3 12/22/2015   A1C today is improved from 10.7 in March 2017 down to 8.3 today on increased dose of metformin 1000mg  BID.  Patient reports no issues with medications.  He understands the need for better glycemic control to help prevent further strokes as we modify his risk factors.  He is reluctant to be started on insulin to to do so and would prefer to try oral agents first.  Plan: - continue metformin 1000mg  BID - start glipizide 5mg  daily - patient checking his blood sugars daily but instructed patient that he no longer needs to do this as he is not on insulin - needs foot exam at next visit - needs eye exam which can be done when he obtains Medicare at age 65 in a few months - continue ACE, statin and aspirin - encouraged dietary discretion with regards to sugars and high carbohydrate foods - RTC in 3 months, goal A1c < 7.0

## 2015-12-22 NOTE — Progress Notes (Signed)
Patient ID: Dennis Macias, male   DOB: 10-30-1950, 65 y.o.   MRN: [id number]   Subjective:   Patient ID: Dennis Macias male   DOB: 26-Jul-1950 65 y.o.   MRN: [id number]  HPI: [name] is a 65 y.o. man with past medical history detailed below presenting today for follow up of his HTN, DM, and stroke history.  Please see A&P for status of medical conditions addressed today.    Past Medical History  Diagnosis Date  . Diabetes ([place])   . Hypertension   . Stroke ([place])   . Hyperlipidemia    Current Outpatient Prescriptions  Medication Sig Dispense Refill  . acetaminophen (TYLENOL) 325 MG tablet Take 650 mg by mouth every 6 (six) hours as needed for mild pain.    Marland Kitchen aspirin 81 MG tablet Take 1 tablet (81 mg total) by mouth daily. 30 tablet 3  . atorvastatin (LIPITOR) 80 MG tablet Take 1 tablet (80 mg total) by mouth daily at 6 PM. 30 tablet 11  . lisinopril (PRINIVIL,ZESTRIL) 20 MG tablet Take 1 tablet (20 mg total) by mouth daily. 30 tablet 1  . glipiZIDE (GLUCOTROL) 5 MG tablet Take 1 tablet (5 mg total) by mouth daily before breakfast. 30 tablet 2  . metFORMIN (GLUCOPHAGE) 1000 MG tablet Take 1 tablet (1,000 mg total) by mouth 2 (two) times daily with a meal. 60 tablet 11   No current facility-administered medications for this visit.   Family History  Problem Relation Age of Onset  . Hypertension Mother   . Hyperlipidemia Mother   . Hyperlipidemia Father    Social History   Social History  . Marital Status: Married    Spouse Name: N/A  . Number of Children: N/A  . Years of Education: N/A   Social History Main Topics  . Smoking status: Former Research scientist (life sciences)  . Smokeless tobacco: Current User    Types: Chew  . Alcohol Use: 0.6 oz/week    1 Cans of beer per week     Comment: 1 6 pack  on occasionally   . Drug Use: No  . Sexual Activity: Not Asked   Other Topics Concern  . None   Social History Narrative   Review of Systems: Review of Systems  Constitutional: Negative for fever  and chills.  Eyes: Negative for blurred vision.  Respiratory: Negative for cough and shortness of breath.   Cardiovascular: Negative for chest pain.  Genitourinary: Negative for frequency.  Musculoskeletal: Negative for falls.  Neurological: Negative for headaches.    Objective:  Physical Exam: Filed Vitals:   12/22/15 1353  BP: 139/83  Pulse: 89  Temp: 98.2 F (36.8 C)  TempSrc: Oral  Weight: 164 lb 14.4 oz (74.798 kg)  SpO2: 100%   Physical Exam  Constitutional: He is oriented to person, place, and time and well-developed, well-nourished, and in no distress.  HENT:  Head: Normocephalic and atraumatic.  Poor dentition  Eyes: EOM are normal.  Neck: Normal range of motion.  Cardiovascular: Normal rate and regular rhythm.   Pulmonary/Chest: Effort normal.  Neurological: He is alert and oriented to person, place, and time.  Psychiatric: Mood and affect normal.    Assessment & Plan:   Case discussed with Dr. Evette Doffing.  Hypertension Assessment: Blood pressure today is 139/83 on single agent lisinopril 20mg  daily.  He reports no adverse effects and states he is adherent to his regimen.  Did not bring a home BP log.  Plan: - continue with lisinopril 20mg  daily - encouraged  home BP log with 3 times per week monitoring and asked patient to bring this to his next appointment - if BP consistently elevated > 140 at follow up will consider increasing lisinopril to 40mg  or adding 2nd agent - encouraged decreased salt intake and lifestyle modifications - no need for BMET this visit as his electrolytes and kidney function were previously normal in March 2017 on lisinopril 10mg .  If we increase to 40mg  daily, will plan to recheck his BMET within 7-10 days after increasing  Uncontrolled type 2 diabetes mellitus ([place]) Assessment: Lab Results  Component Value Date   HGBA1C 8.3 12/22/2015   A1C today is improved from 10.7 in March 2017 down to 8.3 today on increased dose of metformin  1000mg  BID.  Patient reports no issues with medications.  He understands the need for better glycemic control to help prevent further strokes as we modify his risk factors.  He is reluctant to be started on insulin to to do so and would prefer to try oral agents first.  Plan: - continue metformin 1000mg  BID - start glipizide 5mg  daily - patient checking his blood sugars daily but instructed patient that he no longer needs to do this as he is not on insulin - needs foot exam at next visit - needs eye exam which can be done when he obtains Medicare at age 65 in a few months - continue ACE, statin and aspirin - encouraged dietary discretion with regards to sugars and high carbohydrate foods - RTC in 3 months, goal A1c < 7.0   History of CVA (cerebrovascular accident) Assessment: Seen by Dr. Leonie Man (neurology) on 11/28/15.  He continues to take his aspirin and statin regularly.  He is feeling well today and without any weakness, numbness, dysphagia, facial droop, or confusion. Understands importance of medication adherence.   Plan: - continue statin and aspirin - continue other risk factor modifications (BP, DM) - follow up with neurology as scheduled  Hyperlipidemia Assessment: His LDL was 148 in March 2017.  He reports adherence to Lipitor 80mg  daily.    Plan: - no need for repeat lipid panel as he is already on max dose statin - continue Lipitor 80mg  daily  Healthcare maintenance Assessment/Plan: - patient given stool cards today to screen for colon cancer - needs eye and foot exam - received PPSV 23 in March 2017.  Will need PCV 13 at least 1 year after PPSV 23

## 2015-12-22 NOTE — Assessment & Plan Note (Signed)
Assessment: His LDL was 148 in March 2017.  He reports adherence to Lipitor 80mg  daily.    Plan: - no need for repeat lipid panel as he is already on max dose statin - continue Lipitor 80mg  daily

## 2015-12-23 NOTE — Progress Notes (Signed)
Internal Medicine Clinic Attending  Case discussed with Dr. Wallace at the time of the visit.  We reviewed the resident's history and exam and pertinent patient test results.  I agree with the assessment, diagnosis, and plan of care documented in the resident's note.  

## 2016-01-26 NOTE — Telephone Encounter (Signed)
Appt with Butch Penny on 03/22/2016

## 2016-01-27 ENCOUNTER — Telehealth: Payer: Self-pay | Admitting: Pharmacist

## 2016-01-27 DIAGNOSIS — E785 Hyperlipidemia, unspecified: Secondary | ICD-10-CM

## 2016-01-27 DIAGNOSIS — E1165 Type 2 diabetes mellitus with hyperglycemia: Secondary | ICD-10-CM

## 2016-01-27 DIAGNOSIS — IMO0002 Reserved for concepts with insufficient information to code with codable children: Secondary | ICD-10-CM

## 2016-01-27 DIAGNOSIS — E118 Type 2 diabetes mellitus with unspecified complications: Secondary | ICD-10-CM

## 2016-01-27 DIAGNOSIS — I10 Essential (primary) hypertension: Secondary | ICD-10-CM

## 2016-01-27 MED ORDER — ATORVASTATIN CALCIUM 80 MG PO TABS: 80.0000 mg | ORAL_TABLET | Freq: Every day | ORAL | Status: DC

## 2016-01-27 MED ORDER — LISINOPRIL 20 MG PO TABS: 20.0000 mg | ORAL_TABLET | Freq: Every day | ORAL | Status: DC

## 2016-01-27 MED ORDER — ASPIRIN 81 MG PO TABS: 81.0000 mg | ORAL_TABLET | Freq: Every day | ORAL | Status: DC

## 2016-01-27 MED ORDER — GLIPIZIDE-METFORMIN HCL 2.5-500 MG PO TABS: 2.0000 | ORAL_TABLET | Freq: Two times a day (BID) | ORAL | Status: DC

## 2016-01-27 MED FILL — ATORVASTATIN 80 MG TABLET: 80 | 30 days supply | Qty: 30 | Fill #0

## 2016-01-27 NOTE — Telephone Encounter (Signed)
Patient requested help with medication cost, referred patient to [facility] per PCP approval, prescriptions transferred. Patient advised to contact me if any further concerns.

## 2016-02-01 MED FILL — LISINOPRIL 20 MG TABLET: 20 | 30 days supply | Qty: 30 | Fill #0

## 2016-02-01 MED FILL — ASPIR-LOW EC 81 MG TABLET: 81 | 30 days supply | Qty: 30 | Fill #0

## 2016-02-07 ENCOUNTER — Encounter: Payer: Self-pay | Admitting: Dietician

## 2016-02-24 ENCOUNTER — Other Ambulatory Visit: Payer: Self-pay | Admitting: Pharmacist

## 2016-02-24 DIAGNOSIS — E118 Type 2 diabetes mellitus with unspecified complications: Principal | ICD-10-CM

## 2016-02-24 DIAGNOSIS — IMO0002 Reserved for concepts with insufficient information to code with codable children: Secondary | ICD-10-CM

## 2016-02-24 DIAGNOSIS — E1165 Type 2 diabetes mellitus with hyperglycemia: Secondary | ICD-10-CM

## 2016-02-24 MED ORDER — GLIPIZIDE-METFORMIN HCL 2.5-500 MG PO TABS: 2.0000 | ORAL_TABLET | Freq: Two times a day (BID) | ORAL | 3 refills | Status: DC

## 2016-02-24 NOTE — Progress Notes (Signed)
Rx from 01/27/16 for glipizide-metformin did transmit, re-sent Rx today.

## 2016-03-07 MED FILL — ASPIR-LOW EC 81 MG TABLET: 81 | 30 days supply | Qty: 30 | Fill #1

## 2016-03-07 MED FILL — ATORVASTATIN 80 MG TABLET: 80 | 30 days supply | Qty: 30 | Fill #1

## 2016-03-20 MED FILL — GLIPIZIDE-METFORMIN 2.5-500: 2.5-500 | 30 days supply | Qty: 120 | Fill #0

## 2016-03-21 ENCOUNTER — Telehealth: Payer: Self-pay | Admitting: Internal Medicine

## 2016-03-21 NOTE — Telephone Encounter (Signed)
APT. REMINDER CALL, LMTCB °

## 2016-03-22 ENCOUNTER — Ambulatory Visit (INDEPENDENT_AMBULATORY_CARE_PROVIDER_SITE_OTHER): Payer: Self-pay | Admitting: Internal Medicine

## 2016-03-22 ENCOUNTER — Encounter: Payer: Self-pay | Admitting: Internal Medicine

## 2016-03-22 ENCOUNTER — Ambulatory Visit (INDEPENDENT_AMBULATORY_CARE_PROVIDER_SITE_OTHER): Payer: Self-pay | Admitting: Dietician

## 2016-03-22 VITALS — BP 181/84 | HR 89 | Temp 98.4°F | Ht 72.0 in | Wt 169.7 lb

## 2016-03-22 DIAGNOSIS — Z79899 Other long term (current) drug therapy: Secondary | ICD-10-CM

## 2016-03-22 DIAGNOSIS — E118 Type 2 diabetes mellitus with unspecified complications: Principal | ICD-10-CM

## 2016-03-22 DIAGNOSIS — IMO0002 Reserved for concepts with insufficient information to code with codable children: Secondary | ICD-10-CM

## 2016-03-22 DIAGNOSIS — E785 Hyperlipidemia, unspecified: Secondary | ICD-10-CM

## 2016-03-22 DIAGNOSIS — Z7984 Long term (current) use of oral hypoglycemic drugs: Secondary | ICD-10-CM

## 2016-03-22 DIAGNOSIS — E119 Type 2 diabetes mellitus without complications: Secondary | ICD-10-CM

## 2016-03-22 DIAGNOSIS — I10 Essential (primary) hypertension: Secondary | ICD-10-CM

## 2016-03-22 DIAGNOSIS — Z0000 Encounter for general adult medical examination without abnormal findings: Secondary | ICD-10-CM

## 2016-03-22 DIAGNOSIS — Z87891 Personal history of nicotine dependence: Secondary | ICD-10-CM

## 2016-03-22 DIAGNOSIS — Z713 Dietary counseling and surveillance: Secondary | ICD-10-CM

## 2016-03-22 DIAGNOSIS — E1169 Type 2 diabetes mellitus with other specified complication: Secondary | ICD-10-CM

## 2016-03-22 DIAGNOSIS — E1165 Type 2 diabetes mellitus with hyperglycemia: Secondary | ICD-10-CM

## 2016-03-22 DIAGNOSIS — Z23 Encounter for immunization: Secondary | ICD-10-CM

## 2016-03-22 DIAGNOSIS — B351 Tinea unguium: Secondary | ICD-10-CM

## 2016-03-22 LAB — GLUCOSE, CAPILLARY: Glucose-Capillary: 79 mg/dL (ref 65–99)

## 2016-03-22 LAB — POCT GLYCOSYLATED HEMOGLOBIN (HGB A1C): Hemoglobin A1C: 6.1

## 2016-03-22 MED ORDER — LISINOPRIL 40 MG PO TABS: 40.0000 mg | ORAL_TABLET | Freq: Every day | ORAL | 2 refills | Status: DC

## 2016-03-22 MED FILL — LISINOPRIL 40 MG TABLET: 40 | 30 days supply | Qty: 30 | Fill #0

## 2016-03-22 NOTE — Assessment & Plan Note (Signed)
Lab Results  Component Value Date   HGBA1C 6.1 03/22/2016   A: He is currently on glipizide-metformin 5-1000mg  BID and doing well.  He has not experienced any hypoglycemia symptoms and denies confusion, shakiness, or anxiety.  He checks his blood sugar about every other day.  He is meeting with our diabetes educator today as well.  His A1C has responded very well to oral medication and today was 6.1.  P: - we will continue with current medications of glipizide-metformin 5-1000mg  BID - we will repeat a A1C in 3 months. - I told patient that he may stop checking his blood sugars if he wishes since he is controlled and only on oral medications. - foot exam was done today. - patient will be referred for eye exam at next appointment.

## 2016-03-22 NOTE — Assessment & Plan Note (Signed)
A: Patient has onychomycosis of several toenails and is unable to debride them at home on his own.  P: - ambulatory referral for podiatry placed today.

## 2016-03-22 NOTE — Progress Notes (Signed)
CC: here for f/u DM, HTN, HLD  HPI:  [name] is a 65 y.o. man with a past medical history listed below here today for follow up of his DM, HTN, and HLD.   For details of today's visit and the status of his chronic medical issues please refer to the assessment and plan.   Past Medical History:  Diagnosis Date  . Diabetes ([place])   . Hyperlipidemia   . Hypertension   . Stroke [facility])     Review of Systems:   Please see pertinent ROS reviewed in HPI and problem based charting.  Physical Exam:  Vitals:   03/22/16 1325  BP: (!) 181/84  Pulse: 89  Temp: 98.4 F (36.9 C)  TempSrc: Oral  SpO2: 100%  Weight: 169 lb 11.2 oz (77 kg)  Height: 6' (1.829 m)   Physical Exam  Constitutional: He is oriented to person, place, and time and well-developed, well-nourished, and in no distress.  HENT:  Head: Normocephalic and atraumatic.  Poor dentition.  Eyes: EOM are normal.  Cardiovascular: Normal rate, regular rhythm, normal heart sounds and intact distal pulses.   Pulmonary/Chest: Effort normal and breath sounds normal. He has no wheezes.  Musculoskeletal: He exhibits no edema.  Neurological: He is alert and oriented to person, place, and time. No cranial nerve deficit.  Skin: Skin is warm and dry.  Onychomycosis of several toenails.  Psychiatric: Mood and affect normal.      Assessment & Plan:   See Encounters Tab for problem based charting.  Patient discussed with Dr. Eppie Gibson   Hypertension BP Readings from Last 3 Encounters:  03/22/16 (!) 181/84  12/22/15 139/83  11/28/15 (!) 157/89   A: His BP is elevated today.  Last visit it was at a more reasonable value but previous visits he has also been hypertensive.  He states he took his BP yesterday at home as well and the systolic reading was in the 170s.  He has not experienced any chest pain, light headedness, blurry vision, headaches, or SOB.  He does not use tobacco but he reports a high salt diet.  P: - we will  increase his lisinopril today to 40 mg daily and have him return in 2 weeks for recheck.  We will also plan to get a BMET at that time with the increase in his lisinopril. - we will ask him to keep a regular BP log and bring this to his follow up appointment with his home BP cuff. - we will ask him to adhere to more of a DASH-type diet. - in the future, he may benefit from a medication like HCTZ given his high salt diet. - RTC 2 weeks.  Uncontrolled type 2 diabetes mellitus ([place]) Lab Results  Component Value Date   HGBA1C 6.1 03/22/2016   A: He is currently on glipizide-metformin 5-1000mg  BID and doing well.  He has not experienced any hypoglycemia symptoms and denies confusion, shakiness, or anxiety.  He checks his blood sugar about every other day.  He is meeting with our diabetes educator today as well.  His A1C has responded very well to oral medication and today was 6.1.  P: - we will continue with current medications of glipizide-metformin 5-1000mg  BID - we will repeat a A1C in 3 months. - I told patient that he may stop checking his blood sugars if he wishes since he is controlled and only on oral medications. - foot exam was done today. - patient will be referred for  eye exam at next appointment.  Onychomycosis of multiple toenails with type 2 diabetes mellitus ([place]) A: Patient has onychomycosis of several toenails and is unable to debride them at home on his own.  P: - ambulatory referral for podiatry placed today.  Hyperlipidemia A: Patient is adherent to his statin therapy and takes nightly at 6pm.  P: - we will continue with high-intensity atorvastatin 80mg  daily.  Healthcare maintenance A/P: - flu vaccine given today. - patient did not return stool cards but states he still has them at home and will complete them. - foot exam done today. - needs eye exam. - will be due for PCV13 at age 77 and at least 1 year from his PPSV23 vaccination.

## 2016-03-22 NOTE — Assessment & Plan Note (Addendum)
BP Readings from Last 3 Encounters:  03/22/16 (!) 181/84  12/22/15 139/83  11/28/15 (!) 157/89   A: His BP is elevated today.  Last visit it was at a more reasonable value but previous visits he has also been hypertensive.  He states he took his BP yesterday at home as well and the systolic reading was in the 170s.  He has not experienced any chest pain, light headedness, blurry vision, headaches, or SOB.  He does not use tobacco but he reports a high salt diet.  P: - we will increase his lisinopril today to 40 mg daily and have him return in 2 weeks for recheck.  We will also plan to get a BMET at that time with the increase in his lisinopril. - we will ask him to keep a regular BP log and bring this to his follow up appointment with his home BP cuff. - we will ask him to adhere to more of a DASH-type diet. - in the future, he may benefit from a medication like HCTZ given his high salt diet. - RTC 2 weeks.

## 2016-03-22 NOTE — Assessment & Plan Note (Signed)
A/P: - flu vaccine given today. - patient did not return stool cards but states he still has them at home and will complete them. - foot exam done today. - needs eye exam. - will be due for PCV13 at age 65 and at least 1 year from his PPSV23 vaccination.

## 2016-03-22 NOTE — Progress Notes (Signed)
Diabetes Self-Management Education  Visit Type:  Follow-up (1)  Appt. Start Time: 1420 Appt. End Time: 8841  03/22/2016  [name], identified by name and date of birth, is a 65 y.o. male with a diagnosis of Diabetes:  .Type 2  ASSESSMENT  Weight 169#- healthy weight for him A1C- 6.1% patient reports only taking 1 combination pill of metformin and glipizide twice daily.        Diabetes Self-Management Education - 03/22/16 1500      Health Coping   How would you rate your overall health? Good     Psychosocial Assessment   Patient Belief/Attitude about Diabetes Motivated to manage diabetes   Self-care barriers None   Self-management support Doctor's office;Family;CDE visits   Patient Concerns Healthy Lifestyle   Special Needs None   Preferred Learning Style No preference indicated   Learning Readiness Ready     Complications   Last HgB A1C per patient/outside source 6.1 %   How often do you check your blood sugar? 3-4 times / week   Fasting Blood glucose range (mg/dL) 70-129   Number of hypoglycemic episodes per month 0   Number of hyperglycemic episodes per week 0   Have you had a dilated eye exam in the past 12 months? Yes   Have you had a dental exam in the past 12 months? No   Are you checking your feet? Yes   How many days per week are you checking your feet? 7     Dietary Intake   Breakfast grits, bacon or sausage, eggs, toast, soemtimes OJ   Lunch crackers( Nabs) or peanut butter sandwich   Dinner beans, ham or pork chop, potato salad    Beverage(s) sweet and unsweet tea and ~ 3 sodas a day both regular and diet     Exercise   Exercise Type ADL's;Light (walking / raking leaves)   How many days per week to you exercise? 7   How many minutes per day do you exercise? 20   Total minutes per week of exercise 140     Patient Education   Nutrition management  Meal options for control of blood pressure   Physical activity and exercise   importance of 30 minutes  of activity  daily on blood pressure control   Chronic complications Relationship between chronic complications and blood glucose control;Assessed and discussed foot care and prevention of foot problems;Lipid levels, blood glucose control and heart disease;Dental care;Retinopathy and reason for yearly dilated eye exams     Individualized Goals (developed by patient)   Nutrition Follow meal plan discussed     Patient Self-Evaluation of Goals - Patient rates self as meeting previously set goals (% of time)   Nutrition 50 - 75 %     Post-Education Assessment   Patient understands the diabetes disease and treatment process. Demonstrates understanding / competency   Patient understands incorporating nutritional management into lifestyle. Needs Review   Patient undertands incorporating physical activity into lifestyle. Needs Review   Patient understands using medications safely. Demonstrates understanding / competency   Patient understands monitoring blood glucose, interpreting and using results Demonstrates understanding / competency   Patient understands prevention, detection, and treatment of acute complications. Demonstrates understanding / competency   Patient understands prevention, detection, and treatment of chronic complications. Demonstrates understanding / competency   Patient understands how to develop strategies to address psychosocial issues. Demonstrates understanding / competency   Patient understands how to develop strategies to promote health/change behavior. Demonstrates understanding /  competency     Outcomes   Program Status Completed     Subsequent Visit   Since your last visit have you continued or begun to take your medications as prescribed? Yes   Since your last visit have you had your blood pressure checked? Yes   Is your most recent blood pressure lower, unchanged, or higher since your last visit? Higher   Since your last visit have you experienced any weight changes?  No change   Since your last visit, are you checking your blood glucose at least once a day? No      Learning Objective:  Patient will have a greater understanding of diabetes self-management. Patient education plan is to attend individual and/or group sessions per assessed needs and concerns.   Plan:   There are no Patient Instructions on file for this visit.   Expected Outcomes:  Demonstrated interest in learning. Expect positive outcomes  Education material provided: Living Well with Diabetes  If problems or questions, patient to contact team via:  Phone  Future DSME appointment: - 6 months

## 2016-03-22 NOTE — Assessment & Plan Note (Signed)
A: Patient is adherent to his statin therapy and takes nightly at 6pm.  P: - we will continue with high-intensity atorvastatin 80mg  daily.

## 2016-03-22 NOTE — Patient Instructions (Addendum)
Dennis Macias,  You have done a wonderful job taking your medicne and changing the way you eat. This has lowered your A1C from more than 10 (equal to a blood sugar of 240) to 8 (183)  and now to 6 (126) !  Now to lower your blood pressure to less than 140 / 90 !   Follow up in March 2018 or sooner if you have questions or concerns.   Butch Penny [phone]

## 2016-03-22 NOTE — Patient Instructions (Addendum)
Thank you for coming to see me today. It was a pleasure. Today we talked about:   1) Keep up the great work with your diabetes!   2) I have increased your blood pressure medication.  Take lisinopril 40mg  daily.  For now you can take 2 of the 20mg  tablets but I will send 40mg  tablets to your pharmacy.  3) We gave you the flu shot today.  Please follow-up with [place] in 2 weeks.  If you have any questions or concerns, please do not hesitate to call the office at [phone].  Take Care,   Jule Ser, DO

## 2016-03-27 NOTE — Progress Notes (Signed)
Patient ID: Dennis Macias, male   DOB: 29-Nov-1950, 65 y.o.   MRN: [id number]  Case discussed with Dr. Juleen China at the time of the visit.  We reviewed the resident's history and exam and pertinent patient test results.  I agree with the assessment, diagnosis and plan of care documented in the resident's note.

## 2016-04-09 ENCOUNTER — Ambulatory Visit (INDEPENDENT_AMBULATORY_CARE_PROVIDER_SITE_OTHER): Payer: Medicare Other | Admitting: Internal Medicine

## 2016-04-09 VITALS — BP 158/95 | HR 97 | Temp 98.9°F | Ht 72.0 in | Wt 167.7 lb

## 2016-04-09 DIAGNOSIS — I10 Essential (primary) hypertension: Secondary | ICD-10-CM | POA: Diagnosis not present

## 2016-04-09 DIAGNOSIS — Z87891 Personal history of nicotine dependence: Secondary | ICD-10-CM | POA: Diagnosis not present

## 2016-04-09 DIAGNOSIS — Z79899 Other long term (current) drug therapy: Secondary | ICD-10-CM

## 2016-04-09 DIAGNOSIS — Z7982 Long term (current) use of aspirin: Secondary | ICD-10-CM | POA: Diagnosis not present

## 2016-04-09 MED ORDER — HYDROCHLOROTHIAZIDE 12.5 MG PO CAPS: 12.5000 mg | ORAL_CAPSULE | Freq: Every day | ORAL | 1 refills | Status: DC

## 2016-04-09 NOTE — Patient Instructions (Signed)
Dennis Macias it was a pleasure meeting you today.  -Continue taking Lisinopril 40 mg daily.  -START taking Hydrochlorothiazide 12.5 mg daily.  -Return to the clinic in 4 weeks.

## 2016-04-09 NOTE — Progress Notes (Signed)
   CC: Patient is here to discuss his HTN.   HPI:  [name] is a 65 y.o. M with a PMHx of conditions listed below presenting to the clinic to discuss his HTN. Please see problem based charting for the status of the patient's chronic medical conditions.   Past Medical History:  Diagnosis Date  . Diabetes ([place])   . Hyperlipidemia   . Hypertension   . Stroke [facility])     Review of Systems:  Pertinent positives mentioned in HPI. Remainder of all ROS negative.   Physical Exam:  Vitals:   04/09/16 1324  BP: (!) 158/95  Pulse: 97  Temp: 98.9 F (37.2 C)  TempSrc: Oral  SpO2: 100%  Weight: 167 lb 11.2 oz (76.1 kg)  Height: 6' (1.829 m)   Physical Exam  Constitutional: He is oriented to person, place, and time. He appears well-developed and well-nourished. No distress.  HENT:  Head: Normocephalic and atraumatic.  Eyes: EOM are normal.  Neck: Neck supple. No tracheal deviation present.  Cardiovascular: Normal rate, regular rhythm and intact distal pulses.   Pulmonary/Chest: Effort normal and breath sounds normal. No respiratory distress.  Abdominal: Soft. Bowel sounds are normal. He exhibits no distension. There is no tenderness. There is no guarding.  Musculoskeletal: He exhibits no edema.  Neurological: He is alert and oriented to person, place, and time.  Skin: Skin is warm and dry.    Assessment & Plan:   See Encounters Tab for problem based charting.  Patient discussed with Dr. Lynnae January

## 2016-04-09 NOTE — Assessment & Plan Note (Addendum)
BP Readings from Last 3 Encounters:  04/09/16 (!) 158/95  03/22/16 (!) 181/84  12/22/15 139/83    Lab Results  Component Value Date   NA 139 09/29/2015   K 4.4 09/29/2015   CREATININE 0.94 09/29/2015    Assessment: Blood pressure control:  above goal Progress toward BP goal:   improved  Comments: Patient is currently on Lisinopril 40 mg daily and BP continues to be above goal (<140/90). Reports watching his dietary salt intake.   Plan: Medications:  Continue Lisinopril as above. Start HCTZ 12.5 mg daily.  Educational resources provided:   Educated patient about healthy eating and exercise. Emphasized the importance of weight loss.  Other plans:  -Reminded to bring log to next visit.  -Check BMET at next visit.  -RTC in 4 weeks.

## 2016-04-09 NOTE — Progress Notes (Deleted)
   CC: ***  HPI:  Mr.Joran Pelzel is a 65 y.o.   Past Medical History:  Diagnosis Date  . Diabetes ([place])   . Hyperlipidemia   . Hypertension   . Stroke [facility])     Review of Systems:  ***  Physical Exam:  Vitals:   04/09/16 1324  BP: (!) 158/95  Pulse: 97  Temp: 98.9 F (37.2 C)  TempSrc: Oral  SpO2: 100%  Weight: 167 lb 11.2 oz (76.1 kg)  Height: 6' (1.829 m)   ***  Assessment & Plan:   See Encounters Tab for problem based charting.  Patient {GC/GE:[id number]::"discussed with","seen with"} Dr. {NAMES:[id number]::"Butcher","Granfortuna","E. Hoffman","Klima","Mullen","Narendra","Vincent"}

## 2016-04-10 ENCOUNTER — Other Ambulatory Visit: Payer: Self-pay | Admitting: Pharmacist

## 2016-04-10 DIAGNOSIS — I10 Essential (primary) hypertension: Secondary | ICD-10-CM

## 2016-04-10 MED ORDER — HYDROCHLOROTHIAZIDE 12.5 MG PO CAPS: 12.5000 mg | ORAL_CAPSULE | Freq: Every day | ORAL | 1 refills | Status: DC

## 2016-04-10 MED FILL — LISINOPRIL 40 MG TABLET: 40 | 30 days supply | Qty: 30 | Fill #1

## 2016-04-10 MED FILL — ATORVASTATIN 80 MG TABLET: 80 | 30 days supply | Qty: 30 | Fill #2

## 2016-04-10 MED FILL — HYDROCHLOROTHIAZIDE 12.5 MG: 12.5 | 30 days supply | Qty: 30 | Fill #0

## 2016-04-10 MED FILL — ASPIR-LOW EC 81 MG TABLET: 81 | 30 days supply | Qty: 30 | Fill #2

## 2016-04-10 MED FILL — GLIPIZIDE-METFORMIN 2.5-500: 2.5-500 | 30 days supply | Qty: 120 | Fill #1

## 2016-04-10 NOTE — Progress Notes (Signed)
Re-sent prescription under IM program

## 2016-04-10 NOTE — Progress Notes (Signed)
Internal Medicine Clinic Attending  Case discussed with Dr. Rathoreat the time of the visit. We reviewed the resident's history and exam and pertinent patient test results. I agree with the assessment, diagnosis, and plan of care documented in the resident's note.  

## 2016-04-17 ENCOUNTER — Ambulatory Visit (INDEPENDENT_AMBULATORY_CARE_PROVIDER_SITE_OTHER): Payer: Medicare Other | Admitting: Sports Medicine

## 2016-04-17 ENCOUNTER — Encounter: Payer: Self-pay | Admitting: Sports Medicine

## 2016-04-17 DIAGNOSIS — I639 Cerebral infarction, unspecified: Secondary | ICD-10-CM

## 2016-04-17 DIAGNOSIS — M79674 Pain in right toe(s): Secondary | ICD-10-CM

## 2016-04-17 DIAGNOSIS — B351 Tinea unguium: Secondary | ICD-10-CM

## 2016-04-17 DIAGNOSIS — E1142 Type 2 diabetes mellitus with diabetic polyneuropathy: Secondary | ICD-10-CM

## 2016-04-17 DIAGNOSIS — M79675 Pain in left toe(s): Secondary | ICD-10-CM

## 2016-04-17 NOTE — Patient Instructions (Signed)
Diabetes and Foot Care Diabetes may cause you to have problems because of poor blood supply (circulation) to your feet and legs. This may cause the skin on your feet to become thinner, break easier, and heal more slowly. Your skin may become dry, and the skin may peel and crack. You may also have nerve damage in your legs and feet causing decreased feeling in them. You may not notice minor injuries to your feet that could lead to infections or more serious problems. Taking care of your feet is one of the most important things you can do for yourself.  HOME CARE INSTRUCTIONS  Wear shoes at all times, even in the house. Do not go barefoot. Bare feet are easily injured.  Check your feet daily for blisters, cuts, and redness. If you cannot see the bottom of your feet, use a mirror or ask someone for help.  Wash your feet with warm water (do not use hot water) and mild soap. Then pat your feet and the areas between your toes until they are completely dry. Do not soak your feet as this can dry your skin.  Apply a moisturizing lotion or petroleum jelly (that does not contain alcohol and is unscented) to the skin on your feet and to dry, brittle toenails. Do not apply lotion between your toes.  Trim your toenails straight across. Do not dig under them or around the cuticle. File the edges of your nails with an emery board or nail file.  Do not cut corns or calluses or try to remove them with medicine.  Wear clean socks or stockings every day. Make sure they are not too tight. Do not wear knee-high stockings since they may decrease blood flow to your legs.  Wear shoes that fit properly and have enough cushioning. To break in new shoes, wear them for just a few hours a day. This prevents you from injuring your feet. Always look in your shoes before you put them on to be sure there are no objects inside.  Do not cross your legs. This may decrease the blood flow to your feet.  If you find a minor scrape,  cut, or break in the skin on your feet, keep it and the skin around it clean and dry. These areas may be cleansed with mild soap and water. Do not cleanse the area with peroxide, alcohol, or iodine.  When you remove an adhesive bandage, be sure not to damage the skin around it.  If you have a wound, look at it several times a day to make sure it is healing.  Do not use heating pads or hot water bottles. They may burn your skin. If you have lost feeling in your feet or legs, you may not know it is happening until it is too late.  Make sure your health care provider performs a complete foot exam at least annually or more often if you have foot problems. Report any cuts, sores, or bruises to your health care provider immediately. SEEK MEDICAL CARE IF:   You have an injury that is not healing.  You have cuts or breaks in the skin.  You have an ingrown nail.  You notice redness on your legs or feet.  You feel burning or tingling in your legs or feet.  You have pain or cramps in your legs and feet.  Your legs or feet are numb.  Your feet always feel cold. SEEK IMMEDIATE MEDICAL CARE IF:   There is increasing redness,   swelling, or pain in or around a wound.  There is a red line that goes up your leg.  Pus is coming from a wound.  You develop a fever or as directed by your health care provider.  You notice a bad smell coming from an ulcer or wound.   This information is not intended to replace advice given to you by your health care provider. Make sure you discuss any questions you have with your health care provider.   Document Released: 06/22/2000 Document Revised: 02/25/2013 Document Reviewed: 12/02/2012 Elsevier Interactive Patient Education 2016 [employer] Inc.  

## 2016-04-17 NOTE — Progress Notes (Signed)
Subjective: Dennis Macias is a 65 y.o. male patient with history of diabetes who presents to office today complaining of long, painful nails  while ambulating in shoes; unable to trim. Patient states that the glucose reading this morning was not recorded but A1c 6. Patient denies any new changes in medication or new problems. Patient denies any new cramping, numbness, burning or tingling in the legs.  Patient Active Problem List   Diagnosis Date Noted  . Onychomycosis of multiple toenails with type 2 diabetes mellitus ([place]) 03/22/2016  . Healthcare maintenance 12/22/2015  . History of CVA (cerebrovascular accident) 09/13/2015  . Hyperlipidemia 09/13/2015  . Uncontrolled type 2 diabetes mellitus ([place])   . Hypertension    Current Outpatient Prescriptions on File Prior to Visit  Medication Sig Dispense Refill  . acetaminophen (TYLENOL) 325 MG tablet Take 650 mg by mouth every 6 (six) hours as needed for mild pain.    Marland Kitchen aspirin 81 MG tablet Take 1 tablet (81 mg total) by mouth daily. Internal Medicine Program place on hold until patient requests to fill 90 tablet 3  . atorvastatin (LIPITOR) 80 MG tablet Take 1 tablet (80 mg total) by mouth daily at 6 PM. Internal Medicine Program 90 tablet 3  . glipiZIDE-metformin (METAGLIP) 2.5-500 MG tablet Take 2 tablets by mouth 2 (two) times daily before a meal. IM Program, place on hold until patient request to fill 120 tablet 3  . hydrochlorothiazide (MICROZIDE) 12.5 MG capsule Take 1 capsule (12.5 mg total) by mouth daily. IM program 90 capsule 1  . lisinopril (PRINIVIL,ZESTRIL) 40 MG tablet Take 1 tablet (40 mg total) by mouth daily. Internal Medicine program. 30 tablet 2   No current facility-administered medications on file prior to visit.    No Known Allergies  Recent Results (from the past 2160 hour(s))  Glucose, capillary     Status: None   Collection Time: 03/22/16  1:25 PM  Result Value Ref Range   Glucose-Capillary 79 65 - 99 mg/dL  POC Hbg  A1C     Status: None   Collection Time: 03/22/16  1:33 PM  Result Value Ref Range   Hemoglobin A1C 6.1     Objective: General: Patient is awake, alert, and oriented x 3 and in no acute distress.  Integument: Skin is warm, dry and supple bilateral. Nails are tender, long, thickened and  dystrophic with subungual debris, consistent with onychomycosis, 1-5 bilateral. No signs of infection. No open lesions or preulcerative lesions present bilateral. Remaining integument unremarkable.  Vasculature:  Dorsalis Pedis pulse1/4 bilateral. Posterior Tibial pulse 1 /4 bilateral.  Capillary fill time <3 sec 1-5 bilateral. No hair growth to the level of the digits. Temperature gradient within normal limits. No varicosities present bilateral. No edema present bilateral.   Neurology: The patient has absent sensation measured with a 5.07/10g Semmes Weinstein Monofilament at all pedal sites bilateral . Vibratory absent diminished bilateral with tuning fork. No Babinski sign present bilateral.   Musculoskeletal: No symptomatic pedal bony deformities noted bilateral. Muscular strength 5/5 in all lower extremity muscular groups bilateral without pain on range of motion. No tenderness with calf compression bilateral.  Assessment and Plan: Problem List Items Addressed This Visit    None    Visit Diagnoses    Dermatophytosis of nail    -  Primary   Toe pain, bilateral       Diabetic polyneuropathy associated with type 2 diabetes mellitus ([place])          -Examined patient. -  Discussed and educated patient on diabetic foot care, especially with  regards to the vascular, neurological and musculoskeletal systems.  -Stressed the importance of good glycemic control and the detriment of not  controlling glucose levels in relation to the foot. -Mechanically debrided all nails 1-5 bilateral using sterile nail nipper and filed with dremel without incident  -Answered all patient questions -Patient to return  in 3  months for at risk foot care -Patient advised to call the office if any problems or questions arise in the meantime.  Landis Martins, DPM

## 2016-05-07 ENCOUNTER — Ambulatory Visit (INDEPENDENT_AMBULATORY_CARE_PROVIDER_SITE_OTHER): Payer: Medicare Other | Admitting: Internal Medicine

## 2016-05-07 VITALS — BP 174/96 | HR 80 | Temp 98.2°F | Ht 72.0 in | Wt 172.1 lb

## 2016-05-07 DIAGNOSIS — Z7982 Long term (current) use of aspirin: Secondary | ICD-10-CM | POA: Diagnosis not present

## 2016-05-07 DIAGNOSIS — Z79899 Other long term (current) drug therapy: Secondary | ICD-10-CM

## 2016-05-07 DIAGNOSIS — I10 Essential (primary) hypertension: Secondary | ICD-10-CM | POA: Diagnosis not present

## 2016-05-07 DIAGNOSIS — Z87891 Personal history of nicotine dependence: Secondary | ICD-10-CM | POA: Diagnosis not present

## 2016-05-07 MED ORDER — AMLODIPINE BESYLATE 10 MG PO TABS: 10.0000 mg | ORAL_TABLET | Freq: Every day | ORAL | 0 refills | Status: DC

## 2016-05-07 MED ORDER — LISINOPRIL-HYDROCHLOROTHIAZIDE 20-12.5 MG PO TABS: 2.0000 | ORAL_TABLET | Freq: Every day | ORAL | 0 refills | Status: DC

## 2016-05-07 MED ORDER — AMLODIPINE BESYLATE 5 MG PO TABS: 5.0000 mg | ORAL_TABLET | Freq: Every day | ORAL | 0 refills | Status: DC

## 2016-05-07 MED FILL — GLIPIZIDE-METFORMIN 2.5-500: 2.5-500 | 30 days supply | Qty: 120 | Fill #2

## 2016-05-07 MED FILL — ASPIR-LOW EC 81 MG TABLET: 81 | 30 days supply | Qty: 30 | Fill #3

## 2016-05-07 MED FILL — AMLODIPINE BESYLATE 5 MG TA: 5 | 30 days supply | Qty: 30 | Fill #0

## 2016-05-07 MED FILL — ATORVASTATIN 80 MG TABLET: 80 | 30 days supply | Qty: 30 | Fill #3

## 2016-05-07 MED FILL — LISINOPRIL-HCTZ 20-12.5 MG: 20-12.5 | 30 days supply | Qty: 60 | Fill #0

## 2016-05-07 NOTE — Patient Instructions (Addendum)
[name] it was nice seeing you today.  -STOP taking Lisinopril  -STOP taking Hydrochlorothiazide    -I am starting you on a combination pill instead:  Start taking Lisinopril-Hydrochlorothiazide 20-12.5 mg: two tablets by mouth daily  In addition, start taking Amlodipine 5 mg: one tablet by mouth daily    Return to the clinic in 4 weeks for a follow-up.

## 2016-05-08 NOTE — Assessment & Plan Note (Addendum)
BP Readings from Last 3 Encounters:  05/07/16 (!) 174/96  04/09/16 (!) 158/95  03/22/16 (!) 181/84    Lab Results  Component Value Date   NA 139 09/29/2015   K 4.4 09/29/2015   CREATININE 0.94 09/29/2015    Assessment: Blood pressure control:  uncontrolled (above goal of less than 140/90) Progress toward BP goal:   deteriorated Comments: Patient is currently taking lisinopril 40 mg daily and hydrochlorothiazide 12.5 mg daily.Home blood pressure reading log showing average systolic in the 501T to 868Y and diastolic in the 57K. Patient denies having any chest pain, shortness of breath, headaches, or blurry vision.  Plan: Medications: Changed to lisinopril-hydrochlorothiazide 20-12.5 mg 2 tablets daily. Start amlodipine 5 mg daily. Educational resources provided:   Educated patient about healthy eating and exercise. Other plans:  -Return to clinic in 1 month  Addendum: SCr 1.1, was 0.9 in 09/2015. Continue current mgmt.

## 2016-05-08 NOTE — Progress Notes (Signed)
   CC: Patient is here for a blood pressure recheck.  HPI:  [name] is a 65 y.o. male with a past medical history of conditions listed below presenting to the clinic for a blood pressure recheck. Please see problem based charting for the status of the patient's current and chronic medical conditions.   Past Medical History:  Diagnosis Date  . Diabetes ([place])   . Hyperlipidemia   . Hypertension   . Stroke [facility])     Review of Systems:  Pertinent positives mentioned in HPI. Remainder of all ROS negative.   Physical Exam:  Vitals:   05/07/16 1344  BP: (!) 174/96  Pulse: 80  Temp: 98.2 F (36.8 C)  TempSrc: Oral  SpO2: 100%  Weight: 172 lb 1.6 oz (78.1 kg)  Height: 6' (1.829 m)   Physical Exam  Constitutional: He is oriented to person, place, and time. He appears well-developed and well-nourished. No distress.  HENT:  Head: Normocephalic and atraumatic.  Mouth/Throat: Oropharynx is clear and moist.  Eyes: EOM are normal.  Neck: Neck supple. No tracheal deviation present.  Cardiovascular: Normal rate, regular rhythm and intact distal pulses.   Pulmonary/Chest: Effort normal and breath sounds normal. No respiratory distress.  Abdominal: Soft. Bowel sounds are normal. He exhibits no distension. There is no tenderness.  Musculoskeletal: Normal range of motion. He exhibits no edema.  Neurological: He is alert and oriented to person, place, and time.  Skin: Skin is warm and dry.    Assessment & Plan:   See Encounters Tab for problem based charting.  Patient discussed with Dr. Evette Doffing

## 2016-05-09 ENCOUNTER — Other Ambulatory Visit: Payer: Self-pay | Admitting: Internal Medicine

## 2016-05-09 DIAGNOSIS — E118 Type 2 diabetes mellitus with unspecified complications: Principal | ICD-10-CM

## 2016-05-09 DIAGNOSIS — IMO0002 Reserved for concepts with insufficient information to code with codable children: Secondary | ICD-10-CM

## 2016-05-09 DIAGNOSIS — E1165 Type 2 diabetes mellitus with hyperglycemia: Secondary | ICD-10-CM

## 2016-05-09 NOTE — Progress Notes (Signed)
Internal Medicine Clinic Attending  Case discussed with Dr. Rathoreat the time of the visit. We reviewed the resident's history and exam and pertinent patient test results. I agree with the assessment, diagnosis, and plan of care documented in the resident's note.  

## 2016-05-14 ENCOUNTER — Other Ambulatory Visit (INDEPENDENT_AMBULATORY_CARE_PROVIDER_SITE_OTHER): Payer: Medicare Other

## 2016-05-14 DIAGNOSIS — I10 Essential (primary) hypertension: Secondary | ICD-10-CM | POA: Diagnosis not present

## 2016-05-15 LAB — BMP8+ANION GAP
Anion Gap: 19 mmol/L — ABNORMAL HIGH (ref 10.0–18.0)
BUN/Creatinine Ratio: 13 (ref 10–24)
BUN: 14 mg/dL (ref 8–27)
CO2: 26 mmol/L (ref 18–29)
Calcium: 9 mg/dL (ref 8.6–10.2)
Chloride: 97 mmol/L (ref 96–106)
Creatinine, Ser: 1.1 mg/dL (ref 0.76–1.27)
GFR calc Af Amer: 81 mL/min/{1.73_m2} (ref >59)
GFR calc non Af Amer: 70 mL/min/{1.73_m2} (ref >59)
Glucose: 122 mg/dL — ABNORMAL HIGH (ref 65–99)
Potassium: 3.7 mmol/L (ref 3.5–5.2)
Sodium: 142 mmol/L (ref 134–144)

## 2016-05-23 DIAGNOSIS — H401111 Primary open-angle glaucoma, right eye, mild stage: Secondary | ICD-10-CM | POA: Diagnosis not present

## 2016-05-23 DIAGNOSIS — E113411 Type 2 diabetes mellitus with severe nonproliferative diabetic retinopathy with macular edema, right eye: Secondary | ICD-10-CM | POA: Diagnosis not present

## 2016-05-23 DIAGNOSIS — E113212 Type 2 diabetes mellitus with mild nonproliferative diabetic retinopathy with macular edema, left eye: Secondary | ICD-10-CM | POA: Diagnosis not present

## 2016-05-23 DIAGNOSIS — H25813 Combined forms of age-related cataract, bilateral: Secondary | ICD-10-CM | POA: Diagnosis not present

## 2016-05-23 DIAGNOSIS — H4051X3 Glaucoma secondary to other eye disorders, right eye, severe stage: Secondary | ICD-10-CM | POA: Diagnosis not present

## 2016-05-23 DIAGNOSIS — H31091 Other chorioretinal scars, right eye: Secondary | ICD-10-CM | POA: Diagnosis not present

## 2016-05-23 DIAGNOSIS — H35372 Puckering of macula, left eye: Secondary | ICD-10-CM | POA: Diagnosis not present

## 2016-05-23 LAB — HM DIABETES EYE EXAM

## 2016-06-04 ENCOUNTER — Encounter: Payer: Self-pay | Admitting: Internal Medicine

## 2016-06-04 ENCOUNTER — Ambulatory Visit (INDEPENDENT_AMBULATORY_CARE_PROVIDER_SITE_OTHER): Payer: Medicare Other | Admitting: Internal Medicine

## 2016-06-04 ENCOUNTER — Encounter: Payer: Self-pay | Admitting: *Deleted

## 2016-06-04 DIAGNOSIS — F17220 Nicotine dependence, chewing tobacco, uncomplicated: Secondary | ICD-10-CM | POA: Diagnosis not present

## 2016-06-04 DIAGNOSIS — I10 Essential (primary) hypertension: Secondary | ICD-10-CM | POA: Diagnosis not present

## 2016-06-04 DIAGNOSIS — Z79899 Other long term (current) drug therapy: Secondary | ICD-10-CM

## 2016-06-04 DIAGNOSIS — Z7982 Long term (current) use of aspirin: Secondary | ICD-10-CM

## 2016-06-04 MED FILL — ATORVASTATIN 80 MG TABLET: 80 | 30 days supply | Qty: 30 | Fill #4

## 2016-06-04 MED FILL — LISINOPRIL-HCTZ 20-12.5 MG: 20-12.5 | 30 days supply | Qty: 60 | Fill #1

## 2016-06-04 MED FILL — AMLODIPINE BESYLATE 5 MG TA: 5 | 30 days supply | Qty: 30 | Fill #1

## 2016-06-04 NOTE — Progress Notes (Signed)
  CC: blood pressure check   HPI: [name] is a 65 y.o. with past medical history as outlined below who presents to clinic for follow up of hypertension. He was recently seen in acute care clinic and found to have elevated blood pressure. His lisinopril- HCTZ dose was increased and he was started on amlodipine 5 mg daily. Since the medication change he has been recording his blood pressures at home SBP was 160-170 initially after the change but has improved to 140-150 recently. He denies any medication side effects. Denies muscle ache, swelling, chest pain, palpitations or shortness of breath.   Please see problem list for status of the pt's chronic medical problems.  Past Medical History:  Diagnosis Date  . Diabetes ([place])   . Hyperlipidemia   . Hypertension   . Stroke [facility])     Review of Systems:  Please see each problem below for a pertinent review of systems. ROS  Physical Exam:  Vitals:   06/04/16 1341  BP: 139/77  Pulse: 86  Temp: 98.4 F (36.9 C)  TempSrc: Oral  SpO2: 100%  Weight: 169 lb 11.2 oz (77 kg)  Height: 6' (1.829 m)   Physical Exam  Constitutional: He appears well-developed and well-nourished. No distress.  Cardiovascular: Normal rate and regular rhythm.   No murmur heard. Pulmonary/Chest: Effort normal. He has no wheezes. He has no rales.    Assessment & Plan:   See Encounters Tab for problem based charting.   Patient seen with Dr. Angelia Dennis Macias

## 2016-06-04 NOTE — Patient Instructions (Addendum)
It was a pleasure to meet you today [name]!   For your blood pressure- keep taking  Amlodipine 5 mg daily  Lisinopril- HCTZ 20-12.5 mg 2 tablets daily   Schedule a follow up appointment to see your primary care doctor in 3 months Please bring your blood pressure log to that appointment   DASH Eating Plan [place] stands for "Dietary Approaches to Stop Hypertension." The DASH eating plan is a healthy eating plan that has been shown to reduce high blood pressure (hypertension). Additional health benefits may include reducing the risk of type 2 diabetes mellitus, heart disease, and stroke. The DASH eating plan may also help with weight loss. What do I need to know about the DASH eating plan? For the DASH eating plan, you will follow these general guidelines:  Choose foods with less than 150 milligrams of sodium per serving (as listed on the food label).  Use salt-free seasonings or herbs instead of table salt or sea salt.  Check with your health care provider or pharmacist before using salt substitutes.  Eat lower-sodium products. These are often labeled as "low-sodium" or "no salt added."  Eat fresh foods. Avoid eating a lot of canned foods.  Eat more vegetables, fruits, and low-fat dairy products.  Choose whole grains. Look for the word "whole" as the first word in the ingredient list.  Choose fish and skinless chicken or [place] more often than red meat. Limit fish, poultry, and meat to 6 oz (170 g) each day.  Limit sweets, desserts, sugars, and sugary drinks.  Choose heart-healthy fats.  Eat more home-cooked food and less restaurant, buffet, and fast food.  Limit fried foods.  Do not fry foods. Cook foods using methods such as baking, boiling, grilling, and broiling instead.  When eating at a restaurant, ask that your food be prepared with less salt, or no salt if possible. What foods can I eat? Seek help from a dietitian for individual calorie needs. Grains  Whole grain or  whole wheat bread. Brown rice. Whole grain or whole wheat pasta. Quinoa, bulgur, and whole grain cereals. Low-sodium cereals. Corn or whole wheat flour tortillas. Whole grain cornbread. Whole grain crackers. Low-sodium crackers. Vegetables  Fresh or frozen vegetables (raw, steamed, roasted, or grilled). Low-sodium or reduced-sodium tomato and vegetable juices. Low-sodium or reduced-sodium tomato sauce and paste. Low-sodium or reduced-sodium canned vegetables. Fruits  All fresh, canned (in natural juice), or frozen fruits. Meat and Other Protein Products  Ground beef (85% or leaner), grass-fed beef, or beef trimmed of fat. Skinless chicken or [place]. Ground chicken or [place]. Pork trimmed of fat. All fish and seafood. Eggs. Dried beans, peas, or lentils. Unsalted nuts and seeds. Unsalted canned beans. Dairy  Low-fat dairy products, such as skim or 1% milk, 2% or reduced-fat cheeses, low-fat ricotta or cottage cheese, or plain low-fat yogurt. Low-sodium or reduced-sodium cheeses. Fats and Oils  Tub margarines without trans fats. Light or reduced-fat mayonnaise and salad dressings (reduced sodium). Avocado. Safflower, olive, or canola oils. Natural peanut or almond butter. Other  Unsalted popcorn and pretzels. The items listed above may not be a complete list of recommended foods or beverages. Contact your dietitian for more options.  What foods are not recommended? Grains  White bread. White pasta. White rice. Refined cornbread. Bagels and croissants. Crackers that contain trans fat. Vegetables  Creamed or fried vegetables. Vegetables in a cheese sauce. Regular canned vegetables. Regular canned tomato sauce and paste. Regular tomato and vegetable juices. Fruits  Canned fruit  in light or heavy syrup. Fruit juice. Meat and Other Protein Products  Fatty cuts of meat. Ribs, chicken wings, bacon, sausage, bologna, salami, chitterlings, fatback, hot dogs, bratwurst, and packaged luncheon meats. Salted  nuts and seeds. Canned beans with salt. Dairy  Whole or 2% milk, cream, half-and-half, and cream cheese. Whole-fat or sweetened yogurt. Full-fat cheeses or blue cheese. Nondairy creamers and whipped toppings. Processed cheese, cheese spreads, or cheese curds. Condiments  Onion and garlic salt, seasoned salt, table salt, and sea salt. Canned and packaged gravies. Worcestershire sauce. Tartar sauce. Barbecue sauce. Teriyaki sauce. Soy sauce, including reduced sodium. Steak sauce. Fish sauce. Oyster sauce. Cocktail sauce. Horseradish. Ketchup and mustard. Meat flavorings and tenderizers. Bouillon cubes. Hot sauce. Tabasco sauce. Marinades. Taco seasonings. Relishes. Fats and Oils  Butter, stick margarine, lard, shortening, ghee, and bacon fat. Coconut, palm kernel, or palm oils. Regular salad dressings. Other  Pickles and olives. Salted popcorn and pretzels. The items listed above may not be a complete list of foods and beverages to avoid. Contact your dietitian for more information.  Where can I find more information? National Heart, Lung, and Blood Institute: travelstabloid.com This information is not intended to replace advice given to you by your health care provider. Make sure you discuss any questions you have with your health care provider. Document Released: 06/14/2011 Document Revised: 12/01/2015 Document Reviewed: 04/29/2013 Elsevier Interactive Patient Education  2017 [employer].

## 2016-06-04 NOTE — Assessment & Plan Note (Signed)
HPI He was recently seen in acute care clinic and found to have elevated blood pressure. His lisinopril- HCTZ dose was increased and he was started on amlodipine 5 mg daily. Since the medication change he has been recording his blood pressures at home SBP was 160-170 initially after the change but has improved to 140-150 recently. He denies any medication side effects. Denies muscle ache, swelling, chest pain, palpitations or shortness of breath.   A: BP 139/77 today. BP well controlled and no side effects with current medication regiment.   P: Continue lisinopril - HCTZ 40-25 mg daily  Continue amlodipine 5 mg daily  Follow up in 3 months, can consider follow up bmet at that time

## 2016-06-05 ENCOUNTER — Ambulatory Visit: Payer: Self-pay | Admitting: Nurse Practitioner

## 2016-06-05 DIAGNOSIS — H25811 Combined forms of age-related cataract, right eye: Secondary | ICD-10-CM | POA: Diagnosis not present

## 2016-06-05 DIAGNOSIS — E113521 Type 2 diabetes mellitus with proliferative diabetic retinopathy with traction retinal detachment involving the macula, right eye: Secondary | ICD-10-CM | POA: Diagnosis not present

## 2016-06-05 DIAGNOSIS — E113412 Type 2 diabetes mellitus with severe nonproliferative diabetic retinopathy with macular edema, left eye: Secondary | ICD-10-CM | POA: Diagnosis not present

## 2016-06-05 DIAGNOSIS — H2589 Other age-related cataract: Secondary | ICD-10-CM | POA: Diagnosis not present

## 2016-06-05 DIAGNOSIS — H211X1 Other vascular disorders of iris and ciliary body, right eye: Secondary | ICD-10-CM | POA: Diagnosis not present

## 2016-06-05 DIAGNOSIS — E113511 Type 2 diabetes mellitus with proliferative diabetic retinopathy with macular edema, right eye: Secondary | ICD-10-CM | POA: Diagnosis not present

## 2016-06-06 ENCOUNTER — Encounter: Payer: Self-pay | Admitting: Nurse Practitioner

## 2016-06-06 NOTE — Progress Notes (Signed)
Internal Medicine Clinic Attending  I saw and evaluated the patient.  I personally confirmed the key portions of the history and exam documented by Dr. Blum and I reviewed pertinent patient test results.  The assessment, diagnosis, and plan were formulated together and I agree with the documentation in the resident's note. 

## 2016-06-07 ENCOUNTER — Encounter: Payer: Self-pay | Admitting: Internal Medicine

## 2016-06-07 DIAGNOSIS — E1139 Type 2 diabetes mellitus with other diabetic ophthalmic complication: Secondary | ICD-10-CM | POA: Insufficient documentation

## 2016-06-07 DIAGNOSIS — H42 Glaucoma in diseases classified elsewhere: Secondary | ICD-10-CM

## 2016-06-12 DIAGNOSIS — E113412 Type 2 diabetes mellitus with severe nonproliferative diabetic retinopathy with macular edema, left eye: Secondary | ICD-10-CM | POA: Diagnosis not present

## 2016-06-15 DIAGNOSIS — H25813 Combined forms of age-related cataract, bilateral: Secondary | ICD-10-CM | POA: Diagnosis not present

## 2016-06-15 DIAGNOSIS — H4051X3 Glaucoma secondary to other eye disorders, right eye, severe stage: Secondary | ICD-10-CM | POA: Diagnosis not present

## 2016-06-19 ENCOUNTER — Encounter: Payer: Self-pay | Admitting: Nurse Practitioner

## 2016-06-19 ENCOUNTER — Ambulatory Visit (INDEPENDENT_AMBULATORY_CARE_PROVIDER_SITE_OTHER): Payer: Medicare Other | Admitting: Nurse Practitioner

## 2016-06-19 VITALS — BP 124/70 | HR 81 | Ht 72.0 in | Wt 169.0 lb

## 2016-06-19 DIAGNOSIS — E785 Hyperlipidemia, unspecified: Secondary | ICD-10-CM

## 2016-06-19 DIAGNOSIS — Z8673 Personal history of transient ischemic attack (TIA), and cerebral infarction without residual deficits: Secondary | ICD-10-CM | POA: Diagnosis not present

## 2016-06-19 DIAGNOSIS — I10 Essential (primary) hypertension: Secondary | ICD-10-CM

## 2016-06-19 DIAGNOSIS — I639 Cerebral infarction, unspecified: Secondary | ICD-10-CM | POA: Diagnosis not present

## 2016-06-19 MED FILL — OFLOXACIN 0.3% EYE DROPS: 0.3 | 25 days supply | Qty: 5 | Fill #0

## 2016-06-19 MED FILL — KETOROLAC 0.4% OPHTH SOLN: 0.4 | 25 days supply | Qty: 5 | Fill #0

## 2016-06-19 MED FILL — GLIPIZIDE-METFORMIN 2.5-500: 2.5-500 | 30 days supply | Qty: 120 | Fill #3

## 2016-06-19 MED FILL — PREDNISOLONE AC 1% EYE DROP: 1 | 25 days supply | Qty: 5 | Fill #0

## 2016-06-19 NOTE — Progress Notes (Addendum)
[facility]  PATIENT: Dennis Macias DOB: 11/05/1950   REASON FOR VISIT: Follow-up for history of stroke  HISTORY FROM: Patient and wife    HISTORY OF PRESENT ILLNESS:UPDATE 12/12/2017CM Dennis Macias, 65 year old male returns for follow-up after hospital admission in March lacunar infarct. Dennis Macias has a history of diabetes hypertension and hyperlipidemia. Dennis Macias remains on aspirin for secondary stroke prevention with no bruising and no bleeding. Dennis Macias remains on Lipitor 80 mg daily without complaints of muscle aches. His most recent random glucose was 79 on 03/22/2016. Blood pressure in the office today 124/70. Dennis Macias is due to have cataract surgery in his right eye Friday of this week. Dennis Macias returns for reevaluation, Dennis Macias has no new neurologic complaints   HISTORY 5/22/17PS5 year old Caucasian male seen today for first office follow-up visit following hospital admission for stroke in March 2017.Dennis Macias is an 65 y.o. male who has Hx DM and was with his family. At 1310 today, 09/12/2015 Dennis Macias noted sudden onset of difficulty forming his words and some imbalance. Dennis Macias was brought to [facility] as code stroke. BP 230/120 but symptoms had resolved. EKG showing possible ST elevation. CT head negative for stroke. Patient placed on TIA alert and given 20 mg Labetalol, his blood pressure in the ED was 223/107. Dennis Macias does have decreased vision in his right eye but this is old. Dennis Macias did not experience focal weakness no numbness involving face or extremities. Patient was not administered IV t-PA secondary to symptoms resolved. Dennis Macias was admitted for further evaluation and treatment. CT scan of the brain on admission showed only mild age-related changes. MRI scan showed 2 small acute lacunar infarcts in the right mid to posterior frontal lobe and a small acute infarct in the left anterior medial left frontal lobe. MRA of the brain showed no significant large vessel stenosis left terminal vertebral artery was not and irregular. LDL  cholesterol is elevated at 148 and hemoglobin A1c at 10.7. Transesophageal echocardiogram showed normal ejection fraction of 60-65% and no cardiac source of embolism or PFO. Carotid Doppler showed no significant extracranial stenosis. Patient was started on aspirin for stroke prevention and Lipitor. Dennis Macias states his done well since discharge his had no recurrent with recurrent stroke or TIA symptoms and was made a full recovery. Dennis Macias states his blood pressure continues to fluctuate a lot and is usually in the 170 range. Today it is [phone]. Dennis Macias has been seen at his medical follow-up clinic and medications are being adjusted. His fasting sugars also continued to be uncontrolled and ranged in the 160-200 range. Dennis Macias is tolerating Lipitor well without muscle aches or pains. Starting aspirin without bruising or bleeding. Dennis Macias has no new neurological complaints today.   REVIEW OF SYSTEMS: Full 14 system review of systems performed and notable only for those listed, all others are neg:  Constitutional: neg  Cardiovascular: neg Ear/Nose/Throat: neg  Skin: neg Eyes: Cataract right eye Respiratory: neg Gastroitestinal: neg  Hematology/Lymphatic: neg  Endocrine: neg Musculoskeletal:neg Allergy/Immunology: neg Neurological: neg Psychiatric: neg Sleep : neg   ALLERGIES: No Known Allergies  HOME MEDICATIONS: Outpatient Medications Prior to Visit  Medication Sig Dispense Refill  . acetaminophen (TYLENOL) 325 MG tablet Take 650 mg by mouth every 6 (six) hours as needed for mild pain.    Marland Kitchen amLODipine (NORVASC) 5 MG tablet Take 1 tablet (5 mg total) by mouth daily. 90 tablet 0  . aspirin 81 MG tablet Take 1 tablet (81 mg total) by mouth daily. Internal Medicine Program place on hold until  patient requests to fill 90 tablet 3  . atorvastatin (LIPITOR) 80 MG tablet Take 1 tablet (80 mg total) by mouth daily at 6 PM. Internal Medicine Program 90 tablet 3  . glipiZIDE-metformin (METAGLIP) 2.5-500 MG tablet Take 2  tablets by mouth 2 (two) times daily before a meal. IM Program, place on hold until patient request to fill 120 tablet 3  . lisinopril-hydrochlorothiazide (ZESTORETIC) 20-12.5 MG tablet Take 2 tablets by mouth daily. 180 tablet 0   No facility-administered medications prior to visit.     PAST MEDICAL HISTORY: Past Medical History:  Diagnosis Date  . Diabetes ([place])   . Hyperlipidemia   . Hypertension   . Stroke [facility])     PAST SURGICAL HISTORY: Past Surgical History:  Procedure Laterality Date  . TEE WITHOUT CARDIOVERSION N/A 09/14/2015   Procedure: TRANSESOPHAGEAL ECHOCARDIOGRAM (TEE);  Surgeon: Larey Dresser, MD;  Location: [facility] ENDOSCOPY;  Service: Cardiovascular;  Laterality: N/A;    FAMILY HISTORY: Family History  Problem Relation Age of Onset  . Hypertension Mother   . Hyperlipidemia Mother   . Hyperlipidemia Father     SOCIAL HISTORY: Social History   Social History  . Marital status: Married    Spouse name: N/A  . Number of children: N/A  . Years of education: N/A   Occupational History  . Not on file.   Social History Main Topics  . Smoking status: Former Research scientist (life sciences)  . Smokeless tobacco: Current User    Types: Chew  . Alcohol use 0.6 oz/week    1 Cans of beer per week     Comment: 1 6 pack  on occasionally   . Drug use: No  . Sexual activity: Not on file   Other Topics Concern  . Not on file   Social History Narrative  . No narrative on file     PHYSICAL EXAM  Vitals:   06/19/16 1311  BP: 124/70  Pulse: 81  Weight: 169 lb (76.7 kg)  Height: 6' (1.829 m)   Body mass index is 22.92 kg/m. General: well developed, well nourished middle aged  male, seated, in no evident distress Head: head normocephalic and atraumatic.  Neck: supple with no carotid bruits Cardiovascular: regular rate and rhythm, no murmurs Musculoskeletal: no deformity Skin:  no rash/petichiae Vascular:  Normal pulses all extremities    Neurological examination  Mental  Status: Awake and fully alert. Oriented to place and time. Recent and remote memory intact. Attention span, concentration and fund of knowledge appropriate. Mood and affect appropriate.  Cranial Nerves: Fundoscopic exam not done Extraocular movements full without nystagmus. Visual fields full to confrontation. Hearing intact. Facial sensation intact. Face, tongue, palate moves normally and symmetrically.  Motor: Normal bulk and tone. Normal strength in all tested extremity muscles. Sensory.: intact to touch ,pinprick .position and vibratory sensation. In the upper and lower extremities except absent vibratory to ankles and below bilaterally Coordination: Rapid alternating movements normal in all extremities. Finger-to-nose and heel-to-shin performed accurately bilaterally. Gait and Station: Arises from chair without difficulty. Stance is normal. Gait demonstrates normal stride length and balance . Unable to heel, toe and tandem walk without difficulty.  Reflexes: 1+ and symmetric. Toes downgoing.    DIAGNOSTIC DATA (LABS, IMAGING, TESTING) - I reviewed patient records, labs, notes, testing and imaging myself where available.  Lab Results  Component Value Date   WBC 8.5 09/12/2015   HGB 16.7 09/12/2015   HCT 49.0 09/12/2015   MCV 83.8 09/12/2015   PLT 294  09/12/2015      Component Value Date/Time   NA 142 05/14/2016 1552   K 3.7 05/14/2016 1552   CL 97 05/14/2016 1552   CO2 26 05/14/2016 1552   GLUCOSE 122 (H) 05/14/2016 1552   GLUCOSE 388 (H) 09/12/2015 1356   BUN 14 05/14/2016 1552   CREATININE 1.10 05/14/2016 1552   CALCIUM 9.0 05/14/2016 1552   PROT 6.4 (L) 09/12/2015 1347   ALBUMIN 3.2 (L) 09/12/2015 1347   AST 13 (L) 09/12/2015 1347   ALT 10 (L) 09/12/2015 1347   ALKPHOS 66 09/12/2015 1347   BILITOT 1.0 09/12/2015 1347   GFRNONAA 70 05/14/2016 1552   GFRAA 81 05/14/2016 1552   Lab Results  Component Value Date   CHOL 234 (H) 09/13/2015   HDL 49 09/13/2015   LDLCALC  148 (H) 09/13/2015   TRIG 183 (H) 09/13/2015   CHOLHDL 4.8 09/13/2015   Lab Results  Component Value Date   HGBA1C 6.1 03/22/2016    ASSESSMENT AND PLAN 10 year Caucasian male with bifrontal lacunar infarcts in March 2017 secondary to small vessel disease with vascular risk factors of hypertension, diabetes, hyperlipidemia.The patient is a current patient of Dr.Sethi  who is out of the office today . This note is sent to the work in doctor.     PLAN:Continue aspirin 81 mg daily  for secondary stroke prevention maintain strict control of hypertension with blood pressure goal below 130/90,  today's reading 124/70  diabetes with hemoglobin A1c goal below 6.5% continue diabetic medication lipids with LDL cholesterol goal below 70 mg/dL. continue Lipitor healthy diet with plenty of whole grains, cereals, fruits and vegetables, exercise regularly and maintain ideal body weight  Follow-up in 6 months if stable at that time we will discharge Dennie Bible, [facility], [facility], [facility] [address], [place], [place] [address] [phone]  I reviewed the above note and documentation by the Nurse Practitioner and agree with the history, physical exam, assessment and plan as outlined above. I was immediately available for face-to-face consultation. Star Age, MD, PhD [facility])

## 2016-06-19 NOTE — Patient Instructions (Signed)
Continue aspirin 81 mg daily  for secondary stroke prevention maintain strict control of hypertension with blood pressure goal below 130/90,  today's reading 124/70  diabetes with hemoglobin A1c goal below 6.5% continue diabetic medication lipids with LDL cholesterol goal below 70 mg/dL. continue Lipitor healthy diet with plenty of whole grains, cereals, fruits and vegetables, exercise regularly and maintain ideal body weight  Follow-up in 6 months if stable at that time we will discharge

## 2016-06-22 DIAGNOSIS — H25011 Cortical age-related cataract, right eye: Secondary | ICD-10-CM | POA: Diagnosis not present

## 2016-06-22 DIAGNOSIS — H25811 Combined forms of age-related cataract, right eye: Secondary | ICD-10-CM | POA: Diagnosis not present

## 2016-06-22 DIAGNOSIS — H2511 Age-related nuclear cataract, right eye: Secondary | ICD-10-CM | POA: Diagnosis not present

## 2016-06-26 ENCOUNTER — Telehealth: Payer: Self-pay | Admitting: Pharmacist

## 2016-06-26 MED FILL — AMLODIPINE BESYLATE 5 MG TA: 5 | 30 days supply | Qty: 30 | Fill #2

## 2016-06-26 NOTE — Progress Notes (Signed)
Patient called with confusion about how to take his BP medications. Provided education and patient verbalized understanding, advised to call back if further questions or concerns. No signs/symptoms of concern at this time.

## 2016-07-10 ENCOUNTER — Other Ambulatory Visit: Payer: Self-pay | Admitting: Internal Medicine

## 2016-07-10 DIAGNOSIS — E1165 Type 2 diabetes mellitus with hyperglycemia: Secondary | ICD-10-CM

## 2016-07-10 DIAGNOSIS — E118 Type 2 diabetes mellitus with unspecified complications: Principal | ICD-10-CM

## 2016-07-10 DIAGNOSIS — IMO0002 Reserved for concepts with insufficient information to code with codable children: Secondary | ICD-10-CM

## 2016-07-10 MED FILL — ATORVASTATIN 80 MG TABLET: 80 | 30 days supply | Qty: 30 | Fill #5

## 2016-07-10 MED FILL — LISINOPRIL-HCTZ 20-12.5 MG: 20-12.5 | 30 days supply | Qty: 60 | Fill #2

## 2016-07-11 MED FILL — GLIPIZIDE-METFORMIN 2.5-500: 2.5-500 | 30 days supply | Qty: 120 | Fill #0

## 2016-07-11 MED FILL — AMLODIPINE BESYLATE 5 MG TA: 5 | 90 days supply | Qty: 90 | Fill #0

## 2016-07-17 ENCOUNTER — Ambulatory Visit: Payer: Medicare Other | Admitting: Sports Medicine

## 2016-07-24 ENCOUNTER — Encounter: Payer: Self-pay | Admitting: Sports Medicine

## 2016-07-24 ENCOUNTER — Ambulatory Visit (INDEPENDENT_AMBULATORY_CARE_PROVIDER_SITE_OTHER): Payer: Medicare Other | Admitting: Sports Medicine

## 2016-07-24 DIAGNOSIS — B351 Tinea unguium: Secondary | ICD-10-CM

## 2016-07-24 DIAGNOSIS — M79674 Pain in right toe(s): Secondary | ICD-10-CM | POA: Diagnosis not present

## 2016-07-24 DIAGNOSIS — M79675 Pain in left toe(s): Secondary | ICD-10-CM | POA: Diagnosis not present

## 2016-07-24 DIAGNOSIS — E1142 Type 2 diabetes mellitus with diabetic polyneuropathy: Secondary | ICD-10-CM

## 2016-07-24 NOTE — Progress Notes (Signed)
Subjective: Dennis Macias is a 66 y.o. male patient with history of diabetes who presents to office today complaining of long, painful nails  while ambulating in shoes; unable to trim. Patient states that the glucose reading this morning was not recorded but A1C has been good still around 6. Patient denies any new changes in medication or new problems. Patient denies any new cramping, numbness, burning or tingling in the legs.  Patient Active Problem List   Diagnosis Date Noted  . Diabetic retinopathy ([place]) 06/07/2016  . 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 Onychomycosis of multiple toenails with type 2 diabetes mellitus ([place]) 03/22/2016  . Healthcare maintenance 12/22/2015  . History of CVA (cerebrovascular accident) 09/13/2015  . Hyperlipidemia 09/13/2015  . Diabetes mellitus with retinopathy of both eyes ([place])   . Hypertension    Current Outpatient Prescriptions on File Prior to Visit  Medication Sig Dispense Refill  . acetaminophen (TYLENOL) 325 MG tablet Take 650 mg by mouth every 6 (six) hours as needed for mild pain.    Marland Kitchen amLODipine (NORVASC) 5 MG tablet TAKE 1 TABLET BY MOUTH ONCE DAILY 90 tablet 1  . aspirin 81 MG tablet Take 1 tablet (81 mg total) by mouth daily. Internal Medicine Program place on hold until patient requests to fill 90 tablet 3  . atorvastatin (LIPITOR) 80 MG tablet Take 1 tablet (80 mg total) by mouth daily at 6 PM. Internal Medicine Program 90 tablet 3  . glipiZIDE-metformin (METAGLIP) 2.5-500 MG tablet TAKE 2 CAPSULES BY MOUTH TWICE DAILY BEFORE MEALS 120 tablet 3  . lisinopril-hydrochlorothiazide (ZESTORETIC) 20-12.5 MG tablet Take 2 tablets by mouth daily. 180 tablet 0   No current facility-administered medications on file prior to visit.    No Known Allergies  Recent Results (from the past 2160 hour(s))  BMP8+Anion Gap     Status: Abnormal   Collection Time: 05/14/16  3:52 PM  Result Value Ref Range   Glucose 122 (H) 65 - 99  mg/dL   BUN 14 8 - 27 mg/dL   Creatinine, Ser 1.10 0.76 - 1.27 mg/dL   GFR calc non Af Amer 70 >59 mL/min/1.73   GFR calc Af Amer 81 >59 mL/min/1.73   BUN/Creatinine Ratio 13 10 - 24   Sodium 142 134 - 144 mmol/L   Potassium 3.7 3.5 - 5.2 mmol/L   Chloride 97 96 - 106 mmol/L   CO2 26 18 - 29 mmol/L   Anion Gap 19.0 (H) 10.0 - 18.0 mmol/L   Calcium 9.0 8.6 - 10.2 mg/dL  HM DIABETES EYE EXAM     Status: Abnormal   Collection Time: 05/23/16 12:00 AM  Result Value Ref Range   HM Diabetic Eye Exam Retinopathy (A) No Retinopathy    Comment: seen by Dr. Clent Jacks  HM DIABETES EYE EXAM     Status: None   Collection Time: 05/23/16 12:00 AM  Result Value Ref Range   HM Diabetic Eye Exam  No Retinopathy    Objective: General: Patient is awake, alert, and oriented x 3 and in no acute distress.  Integument: Skin is warm, dry and supple bilateral. Nails are tender, long, thickened and  dystrophic with subungual debris, consistent with onychomycosis, 1-5 bilateral. No signs of infection. No open lesions or preulcerative lesions present bilateral. Remaining integument unremarkable.  Vasculature:  Dorsalis Pedis pulse1/4 bilateral. Posterior Tibial pulse 1 /4 bilateral.  Capillary fill time <3 sec 1-5 bilateral. No hair growth to the level of the digits.  Temperature gradient within normal limits. No varicosities present bilateral. No edema present bilateral.   Neurology: The patient has absent sensation measured with a 5.07/10g Semmes Weinstein Monofilament at all pedal sites bilateral . Vibratory absent diminished bilateral with tuning fork. No Babinski sign present bilateral.   Musculoskeletal: No symptomatic pedal bony deformities noted bilateral. Muscular strength 5/5 in all lower extremity muscular groups bilateral without pain on range of motion. No tenderness with calf compression bilateral.  Assessment and Plan: Problem List Items Addressed This Visit    None    Visit Diagnoses     Dermatophytosis of nail    -  Primary   Toe pain, bilateral       Diabetic polyneuropathy associated with type 2 diabetes mellitus ([place])          -Examined patient. -Discussed and educated patient on diabetic foot care, especially with  regards to the vascular, neurological and musculoskeletal systems.  -Stressed the importance of good glycemic control and the detriment of not controlling glucose levels in relation to the foot. -Mechanically debrided all nails 1-5 bilateral using sterile nail nipper and filed with dremel without incident  -Answered all patient questions -Patient to return  in 3 months for at risk foot care -Patient advised to call the office if any problems or questions arise in the meantime.  Landis Martins, DPM

## 2016-07-30 DIAGNOSIS — E113511 Type 2 diabetes mellitus with proliferative diabetic retinopathy with macular edema, right eye: Secondary | ICD-10-CM | POA: Diagnosis not present

## 2016-08-03 DIAGNOSIS — E113412 Type 2 diabetes mellitus with severe nonproliferative diabetic retinopathy with macular edema, left eye: Secondary | ICD-10-CM | POA: Diagnosis not present

## 2016-08-07 ENCOUNTER — Other Ambulatory Visit: Payer: Self-pay | Admitting: Internal Medicine

## 2016-08-07 MED FILL — ATORVASTATIN 80 MG TABLET: 80 | 30 days supply | Qty: 30 | Fill #6

## 2016-08-07 MED FILL — GLIPIZIDE-METFORMIN 2.5-500: 2.5-500 | 30 days supply | Qty: 120 | Fill #1

## 2016-08-08 MED FILL — LISINOPRIL-HCTZ 20-12.5 MG: 20-12.5 | 30 days supply | Qty: 60 | Fill #0

## 2016-08-20 DIAGNOSIS — E113412 Type 2 diabetes mellitus with severe nonproliferative diabetic retinopathy with macular edema, left eye: Secondary | ICD-10-CM | POA: Diagnosis not present

## 2016-08-20 DIAGNOSIS — E113511 Type 2 diabetes mellitus with proliferative diabetic retinopathy with macular edema, right eye: Secondary | ICD-10-CM | POA: Diagnosis not present

## 2016-08-20 DIAGNOSIS — H2512 Age-related nuclear cataract, left eye: Secondary | ICD-10-CM | POA: Diagnosis not present

## 2016-08-20 DIAGNOSIS — H211X1 Other vascular disorders of iris and ciliary body, right eye: Secondary | ICD-10-CM | POA: Diagnosis not present

## 2016-09-06 ENCOUNTER — Other Ambulatory Visit: Payer: Self-pay | Admitting: Internal Medicine

## 2016-09-06 ENCOUNTER — Encounter: Payer: Self-pay | Admitting: Internal Medicine

## 2016-09-06 ENCOUNTER — Ambulatory Visit (INDEPENDENT_AMBULATORY_CARE_PROVIDER_SITE_OTHER): Payer: Medicare Other | Admitting: Internal Medicine

## 2016-09-06 VITALS — BP 145/78 | HR 87 | Temp 98.4°F | Ht 72.0 in | Wt 167.8 lb

## 2016-09-06 DIAGNOSIS — E11319 Type 2 diabetes mellitus with unspecified diabetic retinopathy without macular edema: Secondary | ICD-10-CM

## 2016-09-06 DIAGNOSIS — Z79899 Other long term (current) drug therapy: Secondary | ICD-10-CM | POA: Diagnosis not present

## 2016-09-06 DIAGNOSIS — Z0000 Encounter for general adult medical examination without abnormal findings: Secondary | ICD-10-CM

## 2016-09-06 DIAGNOSIS — E1139 Type 2 diabetes mellitus with other diabetic ophthalmic complication: Secondary | ICD-10-CM | POA: Diagnosis not present

## 2016-09-06 DIAGNOSIS — F17290 Nicotine dependence, other tobacco product, uncomplicated: Secondary | ICD-10-CM | POA: Diagnosis not present

## 2016-09-06 DIAGNOSIS — Z23 Encounter for immunization: Secondary | ICD-10-CM

## 2016-09-06 DIAGNOSIS — Z7984 Long term (current) use of oral hypoglycemic drugs: Secondary | ICD-10-CM

## 2016-09-06 DIAGNOSIS — I10 Essential (primary) hypertension: Secondary | ICD-10-CM

## 2016-09-06 DIAGNOSIS — H4089 Other specified glaucoma: Secondary | ICD-10-CM | POA: Diagnosis not present

## 2016-09-06 DIAGNOSIS — H42 Glaucoma in diseases classified elsewhere: Secondary | ICD-10-CM

## 2016-09-06 LAB — GLUCOSE, CAPILLARY: Glucose-Capillary: 105 mg/dL — ABNORMAL HIGH (ref 65–99)

## 2016-09-06 MED ORDER — AMLODIPINE BESYLATE 10 MG PO TABS: 10.0000 mg | ORAL_TABLET | Freq: Every day | ORAL | 0 refills | Status: DC

## 2016-09-06 MED FILL — AMLODIPINE BESYLATE 10 MG T: 10 | 30 days supply | Qty: 30 | Fill #0

## 2016-09-06 MED FILL — GLIPIZIDE-METFORMIN 2.5-500: 2.5-500 | 30 days supply | Qty: 120 | Fill #2

## 2016-09-06 MED FILL — LISINOPRIL-HCTZ 20-12.5 MG: 20-12.5 | 30 days supply | Qty: 60 | Fill #1

## 2016-09-06 MED FILL — ATORVASTATIN 80 MG TABLET: 80 | 30 days supply | Qty: 30 | Fill #7

## 2016-09-06 NOTE — Assessment & Plan Note (Signed)
A: His follow up A1c is currently pending.  He is on oral medications only with glipizide-metformin 5-1000mg  BID.  He denies any symptoms of hyper or hypoglycemia.  He reports taking his medications daily.  He follows with Dr. Katy Fitch and [place] for his retinopathy and glaucoma.  Most recent A1c was 6.1 in Sept 2017.  P: - f/u repeat A1c - continue current medications

## 2016-09-06 NOTE — Progress Notes (Signed)
   CC: here for f/u of DM and HTN   HPI:  Dennis Macias is a 66 y.o. man with a past medical history listed below here today for follow up of his DM and HTN.   For details of today's visit and the status of his chronic medical issues please refer to the assessment and plan.   Past Medical History:  Diagnosis Date  . Diabetes ([place])   . Hyperlipidemia   . Hypertension   . Stroke [facility])     Review of Systems:  Please see pertinent ROS reviewed in HPI and problem based charting.   Physical Exam:  Vitals:   09/06/16 1332  BP: (!) 145/78  Pulse: 87  Temp: 98.4 F (36.9 C)  TempSrc: Oral  SpO2: 100%  Weight: 167 lb 12.8 oz (76.1 kg)  Height: 6' (1.829 m)   Physical Exam  Constitutional: He is well-developed, well-nourished, and in no distress.  Cardiovascular: Normal rate, regular rhythm and normal heart sounds.   No murmur heard. Pulmonary/Chest: Effort normal and breath sounds normal. He has no wheezes. He has no rales.  Musculoskeletal: He exhibits no edema.  Psychiatric: Mood and affect normal.     Assessment & Plan:   See Encounters Tab for problem based charting.  Patient discussed with Dr. Lynnae January.   Hypertension BP Readings from Last 3 Encounters:  09/06/16 (!) 145/78  06/19/16 124/70  06/04/16 139/77   A:  BP is elevated today and he reports adherence with medications on a daily basis and took all his medications today.  We discussed the need for better BP control given his history of DM, HLD, and prior CVA.  Review of flowsheets indicate this is not the case.  He denies any visual changes, chest pain, or headaches.  P: - continue lisinopril-HCTZ 40-25mg  combination - increase amlodipine from 5 to 10mg  - RTC 2 weeks - reports he has a home BP cuff.  Have asked him to monitor BP at home and bring recordings to clinic. - encouraged low sodium diet and increased exercise.  Diabetes mellitus with retinopathy of both eyes ([place]) A: His follow up A1c is  currently pending.  He is on oral medications only with glipizide-metformin 5-1000mg  BID.  He denies any symptoms of hyper or hypoglycemia.  He reports taking his medications daily.  He follows with Dr. Katy Fitch and [place] for his retinopathy and glaucoma.  Most recent A1c was 6.1 in Sept 2017.  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 Zadie Rhine and Katy Fitch.  He is unsure of which eye drops he currently is taking.  P: - f/u with optho.  Have asked him to contact their office to confirm eye drops.  Healthcare maintenance A/P: - Prevnar 13 given today - Stool cards given today

## 2016-09-06 NOTE — Assessment & Plan Note (Signed)
A/P: - Prevnar 13 given today - Stool cards given today

## 2016-09-06 NOTE — Assessment & Plan Note (Signed)
A: Follows with Dr. Zadie Rhine and Katy Fitch.  He is unsure of which eye drops he currently is taking.  P: - f/u with optho.  Have asked him to contact their office to confirm eye drops.

## 2016-09-06 NOTE — Patient Instructions (Addendum)
Thank you for coming to see me today. It was a pleasure. Today we talked about:   Blood Pressure: we have increased your amlodipine to 10mg  daily.  This is an increase from 5mg  daily.  Please pick up this new prescription from your pharmacy.  If you have a BP cuff at home, please monitor your BP on a regular basis.  Health Maintenance: we have given you the updated pneumonia vaccine.  We have given you stool cards to return to our office.  Please call Dr. Zenia Resides office to confirm which, if any, eye drops you need to be on.  Please follow-up with [place] in 2 weeks to recheck your blood pressure.  If you have any questions or concerns, please do not hesitate to call the office at [phone].  Take Care,   Jule Ser, DO

## 2016-09-06 NOTE — Assessment & Plan Note (Signed)
BP Readings from Last 3 Encounters:  09/06/16 (!) 145/78  06/19/16 124/70  06/04/16 139/77   A:  BP is elevated today and he reports adherence with medications on a daily basis and took all his medications today.  We discussed the need for better BP control given his history of DM, HLD, and prior CVA.  Review of flowsheets indicate this is not the case.  He denies any visual changes, chest pain, or headaches.  P: - continue lisinopril-HCTZ 40-25mg  combination - increase amlodipine from 5 to 10mg  - RTC 2 weeks - reports he has a home BP cuff.  Have asked him to monitor BP at home and bring recordings to clinic. - encouraged low sodium diet and increased exercise.

## 2016-09-07 DIAGNOSIS — H2512 Age-related nuclear cataract, left eye: Secondary | ICD-10-CM | POA: Diagnosis not present

## 2016-09-07 DIAGNOSIS — E113511 Type 2 diabetes mellitus with proliferative diabetic retinopathy with macular edema, right eye: Secondary | ICD-10-CM | POA: Diagnosis not present

## 2016-09-07 DIAGNOSIS — E113412 Type 2 diabetes mellitus with severe nonproliferative diabetic retinopathy with macular edema, left eye: Secondary | ICD-10-CM | POA: Diagnosis not present

## 2016-09-07 DIAGNOSIS — H35371 Puckering of macula, right eye: Secondary | ICD-10-CM | POA: Diagnosis not present

## 2016-09-07 NOTE — Progress Notes (Signed)
Internal Medicine Clinic Attending  Case discussed with Dr. Wallace at the time of the visit.  We reviewed the resident's history and exam and pertinent patient test results.  I agree with the assessment, diagnosis, and plan of care documented in the resident's note.  

## 2016-09-10 DIAGNOSIS — E113511 Type 2 diabetes mellitus with proliferative diabetic retinopathy with macular edema, right eye: Secondary | ICD-10-CM | POA: Diagnosis not present

## 2016-09-20 ENCOUNTER — Ambulatory Visit (INDEPENDENT_AMBULATORY_CARE_PROVIDER_SITE_OTHER): Payer: Medicare Other | Admitting: Internal Medicine

## 2016-09-20 VITALS — BP 126/91 | HR 85 | Temp 98.2°F | Ht 72.0 in | Wt 172.1 lb

## 2016-09-20 DIAGNOSIS — Z87891 Personal history of nicotine dependence: Secondary | ICD-10-CM | POA: Diagnosis not present

## 2016-09-20 DIAGNOSIS — Z79899 Other long term (current) drug therapy: Secondary | ICD-10-CM

## 2016-09-20 DIAGNOSIS — I10 Essential (primary) hypertension: Secondary | ICD-10-CM | POA: Diagnosis not present

## 2016-09-20 NOTE — Progress Notes (Signed)
   CC: BP f/u  HPI:  [name] is a 66 y.o. M with pmhx outlined below here for BP follow up. For the details of today's visit please refer to the assessment and plan.   Past Medical History:  Diagnosis Date  . Diabetes ([place])   . Hyperlipidemia   . Hypertension   . Stroke [facility])     Review of Systems:  All pertinents listed in HPI, otherwise negative  Physical Exam:  Vitals:   09/20/16 0910  BP: (!) 126/91  Pulse: 85  Temp: 98.2 F (36.8 C)  TempSrc: Oral  SpO2: 100%  Weight: 172 lb 1.6 oz (78.1 kg)  Height: 6' (1.829 m)    Constitutional: NAD, appears comfortable Cardiovascular: RRR Pulmonary/Chest: CTAB Extremities: Warm and well perfused. No edema.  Neurological: A&Ox3, CN II - XII grossly intact.    Assessment & Plan:   See Encounters Tab for problem based charting.  Patient discussed with Dr. Eppie Gibson

## 2016-09-20 NOTE — Patient Instructions (Signed)
Mr. Sigmund,  It was a pleasure to see you today. You are doing great with your blood pressure medications, keep up the good work. Your blood pressure today was 126/91. Please continue to take your medicines as previously prescribed. Please return to clinic in 3 months to follow up with your primary care doctor. If you have any questions or concerns, call our clinic at [phone] or after hours call [phone] and ask for the internal medicine resident on call. Thank you!

## 2016-09-20 NOTE — Assessment & Plan Note (Addendum)
Patient was seen in clinic 2 weeks ago by his PCP and his amlodipine was increased from 5 mg -> 10 mg daily. He is also taking lisinopril-HCTZ 40-25mg . He reports compliance and is doing well with this dose. He brought his blood pressure log with him today which shows a nice downtrend from 140s/80s to 120s/70s since starting the higher dose amlodipine. He feels well today without complaints.  -- Continue Lisinopril-HCTZ 40-25mg  -- Continue amlodipine 10mg  daily  -- Follow up 3 months with PCP

## 2016-09-20 NOTE — Progress Notes (Signed)
Case discussed with Dr. Guilloud at the time of the visit. We reviewed the resident's history and exam and pertinent patient test results. I agree with the assessment, diagnosis, and plan of care documented in the resident's note. 

## 2016-10-03 ENCOUNTER — Emergency Department (HOSPITAL_COMMUNITY)
Admission: EM | Admit: 2016-10-03 | Discharge: 2016-10-03 | Disposition: A | Discharge status: Home / Self Care | Payer: Medicare Other | Attending: Emergency Medicine | Admitting: Emergency Medicine

## 2016-10-03 ENCOUNTER — Encounter (HOSPITAL_COMMUNITY): Payer: Self-pay

## 2016-10-03 DIAGNOSIS — Z79899 Other long term (current) drug therapy: Secondary | ICD-10-CM | POA: Insufficient documentation

## 2016-10-03 DIAGNOSIS — Z7982 Long term (current) use of aspirin: Secondary | ICD-10-CM | POA: Diagnosis not present

## 2016-10-03 DIAGNOSIS — Z7984 Long term (current) use of oral hypoglycemic drugs: Secondary | ICD-10-CM | POA: Diagnosis not present

## 2016-10-03 DIAGNOSIS — R338 Other retention of urine: Secondary | ICD-10-CM

## 2016-10-03 DIAGNOSIS — E11319 Type 2 diabetes mellitus with unspecified diabetic retinopathy without macular edema: Secondary | ICD-10-CM | POA: Insufficient documentation

## 2016-10-03 DIAGNOSIS — Z8673 Personal history of transient ischemic attack (TIA), and cerebral infarction without residual deficits: Secondary | ICD-10-CM | POA: Insufficient documentation

## 2016-10-03 DIAGNOSIS — Z87891 Personal history of nicotine dependence: Secondary | ICD-10-CM | POA: Insufficient documentation

## 2016-10-03 DIAGNOSIS — R339 Retention of urine, unspecified: Secondary | ICD-10-CM | POA: Diagnosis not present

## 2016-10-03 DIAGNOSIS — K5900 Constipation, unspecified: Secondary | ICD-10-CM | POA: Diagnosis not present

## 2016-10-03 DIAGNOSIS — I10 Essential (primary) hypertension: Secondary | ICD-10-CM | POA: Insufficient documentation

## 2016-10-03 LAB — BASIC METABOLIC PANEL
Anion gap: 14 (ref 5–15)
BUN: 18 mg/dL (ref 6–20)
CO2: 23 mmol/L (ref 22–32)
Calcium: 9 mg/dL (ref 8.9–10.3)
Chloride: 99 mmol/L — ABNORMAL LOW (ref 101–111)
Creatinine, Ser: 1.13 mg/dL (ref 0.61–1.24)
GFR calc Af Amer: >60 mL/min (ref >60)
GFR calc non Af Amer: >60 mL/min (ref >60)
Glucose, Bld: 170 mg/dL — ABNORMAL HIGH (ref 65–99)
Potassium: 4.1 mmol/L (ref 3.5–5.1)
Sodium: 136 mmol/L (ref 135–145)

## 2016-10-03 LAB — CBC WITH DIFFERENTIAL/PLATELET
Basophils Absolute: 0 10*3/uL (ref 0.0–0.1)
Basophils Relative: 0 %
Eosinophils Absolute: 0 10*3/uL (ref 0.0–0.7)
Eosinophils Relative: 0 %
HCT: 29.8 % — ABNORMAL LOW (ref 39.0–52.0)
Hemoglobin: 10.5 g/dL — ABNORMAL LOW (ref 13.0–17.0)
Lymphocytes Relative: 14 %
Lymphs Abs: 1.7 10*3/uL (ref 0.7–4.0)
MCH: 30 pg (ref 26.0–34.0)
MCHC: 35.2 g/dL (ref 30.0–36.0)
MCV: 85.1 fL (ref 78.0–100.0)
Monocytes Absolute: 0.6 10*3/uL (ref 0.1–1.0)
Monocytes Relative: 5 %
Neutro Abs: 9.8 10*3/uL — ABNORMAL HIGH (ref 1.7–7.7)
Neutrophils Relative %: 81 %
Platelets: 304 10*3/uL (ref 150–400)
RBC: 3.5 MIL/uL — ABNORMAL LOW (ref 4.22–5.81)
RDW: 12.4 % (ref 11.5–15.5)
WBC: 12 10*3/uL — ABNORMAL HIGH (ref 4.0–10.5)

## 2016-10-03 LAB — URINALYSIS, ROUTINE W REFLEX MICROSCOPIC
Bacteria, UA: NONE SEEN
Bilirubin Urine: NEGATIVE
Glucose, UA: 50 mg/dL — AB
Ketones, ur: NEGATIVE mg/dL
Leukocytes, UA: NEGATIVE
Nitrite: NEGATIVE
Protein, ur: 100 mg/dL — AB
Specific Gravity, Urine: 1.012 (ref 1.005–1.030)
Squamous Epithelial / LPF: NONE SEEN
pH: 5 (ref 5.0–8.0)

## 2016-10-03 MED ORDER — POLYETHYLENE GLYCOL 3350 17 G PO PACK: 17.0000 g | PACK | Freq: Every day | ORAL | 0 refills | Status: DC

## 2016-10-03 MED ORDER — TAMSULOSIN HCL 0.4 MG PO CAPS: 0.4000 mg | ORAL_CAPSULE | Freq: Every day | ORAL | 0 refills | Status: DC

## 2016-10-03 MED ORDER — DOCUSATE SODIUM 100 MG PO CAPS: 100.0000 mg | ORAL_CAPSULE | Freq: Two times a day (BID) | ORAL | 0 refills | Status: DC

## 2016-10-03 NOTE — ED Triage Notes (Signed)
Pt endorses constipation and urinary retention that began today. LBM yesterday and pt states "I might have peed this morning" Pt complains of rectal pain when sitting flat. VSS.

## 2016-10-03 NOTE — ED Provider Notes (Signed)
[facility] DEPT Provider Note   CSN: [id number] Arrival date & time: 10/03/16  0023  By signing my name below, I, Collene Leyden, attest that this documentation has been prepared under the direction and in the presence of Sherwood Gambler, MD. Electronically Signed: Collene Leyden, Scribe. 10/03/16. 2:12 AM.  History   Chief Complaint Chief Complaint  Patient presents with  . Urinary Retention  . Constipation    HPI Comments: Dennis Macias is a 66 y.o. male with a history of DM, stroke, HLD, and HTN, who presents to the Emergency Department complaining of sudden-onset, constant constipation that began two days ago. Patient states his last BM was 2 days ago. Patient states he feels as if something is there, but will not come out. Patient reports associated urinary retention (last urine output was yesterday morning) and rectal pain. Patient reports constipation after starting a new medication, unsure of which medication. Patient reports drinking coffee with no relief in constipation. Patient denies any back pain, prior prostate problems, abdominal pain, flank pain, dysuria, hematuria, or recent sickness.   Catheter was placed in the emergency department, patient states he feels better status post placement.   The history is provided by the patient. No language interpreter was used.    Past Medical History:  Diagnosis Date  . Diabetes ([place])   . Hyperlipidemia   . Hypertension   . Stroke [facility])     Patient Active Problem List   Diagnosis Date Noted  . Diabetic retinopathy ([place]) 06/07/2016  . 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 Onychomycosis of multiple toenails with type 2 diabetes mellitus ([place]) 03/22/2016  . Healthcare maintenance 12/22/2015  . History of CVA (cerebrovascular accident) 09/13/2015  . Hyperlipidemia 09/13/2015  . Diabetes mellitus with retinopathy of both eyes ([place])   . Hypertension     Past Surgical History:  Procedure Laterality  Date  . TEE WITHOUT CARDIOVERSION N/A 09/14/2015   Procedure: TRANSESOPHAGEAL ECHOCARDIOGRAM (TEE);  Surgeon: Larey Dresser, MD;  Location: [facility];  Service: Cardiovascular;  Laterality: N/A;       Home Medications    Prior to Admission medications   Medication Sig Start Date End Date Taking? Authorizing Provider  amLODipine (NORVASC) 10 MG tablet Take 1 tablet (10 mg total) by mouth daily. 09/06/16  Yes Jule Ser, DO  aspirin 81 MG tablet Take 1 tablet (81 mg total) by mouth daily. Internal Medicine Program place on hold until patient requests to fill 01/27/16  Yes Jule Ser, DO  atorvastatin (LIPITOR) 80 MG tablet Take 1 tablet (80 mg total) by mouth daily at 6 PM. Internal Medicine Program 01/27/16  Yes Jule Ser, DO  glipiZIDE-metformin (METAGLIP) 2.5-500 MG tablet TAKE 2 CAPSULES BY MOUTH TWICE DAILY BEFORE MEALS 07/11/16  Yes Jule Ser, DO  lisinopril-hydrochlorothiazide (PRINZIDE,ZESTORETIC) 20-12.5 MG tablet TAKE 2 TABLETS BY MOUTH DAILY 08/07/16 08/07/17 Yes Jule Ser, DO  docusate sodium (COLACE) 100 MG capsule Take 1 capsule (100 mg total) by mouth every 12 (twelve) hours. 10/03/16   Sherwood Gambler, MD  polyethylene glycol (MIRALAX / GLYCOLAX) packet Take 17 g by mouth daily. 10/03/16   Sherwood Gambler, MD  tamsulosin (FLOMAX) 0.4 MG CAPS capsule Take 1 capsule (0.4 mg total) by mouth daily. 10/03/16   Sherwood Gambler, MD    Family History Family History  Problem Relation Age of Onset  . Hypertension Mother   . Hyperlipidemia Mother   . Hyperlipidemia Father     Social History Social History  Substance Use  Topics  . Smoking status: Former Research scientist (life sciences)  . Smokeless tobacco: Current User    Types: Chew  . Alcohol use 0.6 oz/week    1 Cans of beer per week     Comment: 1 6 pack  on occasionally      Allergies   Patient has no known allergies.   Review of Systems Review of Systems  Gastrointestinal: Positive for constipation. Negative for abdominal  pain.  Genitourinary: Positive for difficulty urinating. Negative for dysuria, flank pain and hematuria.       Rectal pain  Musculoskeletal: Negative for back pain.  All other systems reviewed and are negative.    Physical Exam Updated Vital Signs BP (!) 165/90   Pulse 89   Temp 98.2 F (36.8 C) (Oral)   Resp 18   Ht 6' (1.829 m)   Wt 172 lb (78 kg)   SpO2 100%   BMI 23.33 kg/m   Physical Exam  Constitutional: He is oriented to person, place, and time. He appears well-developed and well-nourished.  HENT:  Head: Normocephalic and atraumatic.  Right Ear: External ear normal.  Left Ear: External ear normal.  Nose: Nose normal.  Eyes: Right eye exhibits no discharge. Left eye exhibits no discharge.  Neck: Neck supple.  Cardiovascular: Normal rate, regular rhythm and normal heart sounds.   Pulmonary/Chest: Effort normal and breath sounds normal.  Abdominal: Soft. There is no tenderness.  No CVA tenderness.   Genitourinary: Testes normal and penis normal.  Genitourinary Comments: Large amount of soft stool in rectal vault. Foley catheter in place.   Musculoskeletal: He exhibits no edema.  Neurological: He is alert and oriented to person, place, and time.  Skin: Skin is warm and dry.  Nursing note and vitals reviewed.    ED Treatments / Results  DIAGNOSTIC STUDIES: Oxygen Saturation is 100% on RA, normal by my interpretation.    COORDINATION OF CARE: 2:12 AM Discussed treatment plan with pt at bedside and pt agreed to plan, which includes a rectal/prostate exam and a suppository.   Labs (all labs ordered are listed, but only abnormal results are displayed) Labs Reviewed  BASIC METABOLIC PANEL - Abnormal; Notable for the following:       Result Value   Chloride 99 (*)    Glucose, Bld 170 (*)    All other components within normal limits  CBC WITH DIFFERENTIAL/PLATELET - Abnormal; Notable for the following:    WBC 12.0 (*)    RBC 3.50 (*)    Hemoglobin 10.5 (*)     HCT 29.8 (*)    Neutro Abs 9.8 (*)    All other components within normal limits  URINALYSIS, ROUTINE W REFLEX MICROSCOPIC - Abnormal; Notable for the following:    Glucose, UA 50 (*)    Hgb urine dipstick SMALL (*)    Protein, ur 100 (*)    All other components within normal limits  URINE CULTURE    EKG  EKG Interpretation None       Radiology No results found.  Procedures Procedures (including critical care time)  Medications Ordered in ED Medications - No data to display   Initial Impression / Assessment and Plan / ED Course  I have reviewed the triage vital signs and the nursing notes.  Pertinent labs & imaging results that were available during my care of the patient were reviewed by me and considered in my medical decision making (see chart for details).     Patient had a Foley catheter  placed prior to me seeing him. He now has complete relief. However he still has constipation and has a sizable amount of stool in his rectum. This also was relieved with an enema. He is now feeling quite well. Likely constipation and/or prostatic hypertrophy contributing to his retention. Will start on Flomax, keep Foley catheter in, and have him follow-up with urology. Will start on a bowel regimen as well. Abdominal exam benign. Labs unremarkable.  Final Clinical Impressions(s) / ED Diagnoses   Final diagnoses:  Acute urinary retention  Constipation, unspecified constipation type    New Prescriptions Discharge Medication List as of 10/03/2016  5:09 AM    START taking these medications   Details  docusate sodium (COLACE) 100 MG capsule Take 1 capsule (100 mg total) by mouth every 12 (twelve) hours., Starting Wed 10/03/2016, Print    polyethylene glycol (MIRALAX / GLYCOLAX) packet Take 17 g by mouth daily., Starting Wed 10/03/2016, Print    tamsulosin (FLOMAX) 0.4 MG CAPS capsule Take 1 capsule (0.4 mg total) by mouth daily., Starting Wed 10/03/2016, Print       I personally  performed the services described in this documentation, which was scribed in my presence. The recorded information has been reviewed and is accurate.    Sherwood Gambler, MD 10/03/16 [phone]

## 2016-10-04 LAB — URINE CULTURE: Culture: NO GROWTH

## 2016-10-08 DIAGNOSIS — R338 Other retention of urine: Secondary | ICD-10-CM | POA: Diagnosis not present

## 2016-10-08 DIAGNOSIS — N401 Enlarged prostate with lower urinary tract symptoms: Secondary | ICD-10-CM | POA: Diagnosis not present

## 2016-10-08 MED FILL — ATORVASTATIN 80 MG TABLET: 80 | 30 days supply | Qty: 30 | Fill #8

## 2016-10-08 MED FILL — GLIPIZIDE-METFORMIN 2.5-500: 2.5-500 | 30 days supply | Qty: 120 | Fill #3

## 2016-10-08 MED FILL — LISINOPRIL-HCTZ 20-12.5 MG: 20-12.5 | 30 days supply | Qty: 60 | Fill #2

## 2016-10-10 DIAGNOSIS — E113412 Type 2 diabetes mellitus with severe nonproliferative diabetic retinopathy with macular edema, left eye: Secondary | ICD-10-CM | POA: Diagnosis not present

## 2016-10-17 DIAGNOSIS — H5703 Miosis: Secondary | ICD-10-CM | POA: Diagnosis not present

## 2016-10-17 DIAGNOSIS — H25812 Combined forms of age-related cataract, left eye: Secondary | ICD-10-CM | POA: Diagnosis not present

## 2016-10-17 DIAGNOSIS — Z961 Presence of intraocular lens: Secondary | ICD-10-CM | POA: Diagnosis not present

## 2016-10-18 ENCOUNTER — Telehealth: Payer: Self-pay

## 2016-10-18 NOTE — Progress Notes (Signed)
Patient was contacted by Florinda Marker, PharmD candidate. I agree with the assessment and plan of care documented.

## 2016-10-18 NOTE — Telephone Encounter (Signed)
Pt was called to discuss medications. All meds cost $4 and the patient is able to afford them just fine. At this time, he does not request any refills but according to his dispense information (as of last report 10/03/16) he should be due for medication refills. It is possible that the patient has picked up since that date.

## 2016-10-22 DIAGNOSIS — R338 Other retention of urine: Secondary | ICD-10-CM | POA: Diagnosis not present

## 2016-10-23 ENCOUNTER — Ambulatory Visit (INDEPENDENT_AMBULATORY_CARE_PROVIDER_SITE_OTHER): Payer: Medicare Other | Admitting: Sports Medicine

## 2016-10-23 ENCOUNTER — Encounter: Payer: Self-pay | Admitting: Sports Medicine

## 2016-10-23 DIAGNOSIS — M79675 Pain in left toe(s): Secondary | ICD-10-CM | POA: Diagnosis not present

## 2016-10-23 DIAGNOSIS — M79674 Pain in right toe(s): Secondary | ICD-10-CM

## 2016-10-23 DIAGNOSIS — B351 Tinea unguium: Secondary | ICD-10-CM

## 2016-10-23 DIAGNOSIS — E1142 Type 2 diabetes mellitus with diabetic polyneuropathy: Secondary | ICD-10-CM

## 2016-10-23 NOTE — Progress Notes (Signed)
Subjective: Dennis Macias is a 66 y.o. male patient with history of diabetes who presents to office today complaining of long, painful nails  while ambulating in shoes; unable to trim. Patient states that the glucose reading this morning was not recorded but A1C has been "fine". Patient denies any new changes in medication or new problems. States he is going for eye surgery on Monday. Patient denies any new cramping, numbness, burning or tingling in the legs.  Patient Active Problem List   Diagnosis Date Noted  . Diabetic retinopathy ([place]) 06/07/2016  . 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 Onychomycosis of multiple toenails with type 2 diabetes mellitus ([place]) 03/22/2016  . Healthcare maintenance 12/22/2015  . History of CVA (cerebrovascular accident) 09/13/2015  . Hyperlipidemia 09/13/2015  . Diabetes mellitus with retinopathy of both eyes ([place])   . Hypertension    Current Outpatient Prescriptions on File Prior to Visit  Medication Sig Dispense Refill  . amLODipine (NORVASC) 10 MG tablet Take 1 tablet (10 mg total) by mouth daily. 90 tablet 0  . aspirin 81 MG tablet Take 1 tablet (81 mg total) by mouth daily. Internal Medicine Program place on hold until patient requests to fill 90 tablet 3  . atorvastatin (LIPITOR) 80 MG tablet Take 1 tablet (80 mg total) by mouth daily at 6 PM. Internal Medicine Program 90 tablet 3  . docusate sodium (COLACE) 100 MG capsule Take 1 capsule (100 mg total) by mouth every 12 (twelve) hours. 60 capsule 0  . glipiZIDE-metformin (METAGLIP) 2.5-500 MG tablet TAKE 2 CAPSULES BY MOUTH TWICE DAILY BEFORE MEALS 120 tablet 3  . lisinopril-hydrochlorothiazide (PRINZIDE,ZESTORETIC) 20-12.5 MG tablet TAKE 2 TABLETS BY MOUTH DAILY 180 tablet 1  . polyethylene glycol (MIRALAX / GLYCOLAX) packet Take 17 g by mouth daily. 14 each 0  . tamsulosin (FLOMAX) 0.4 MG CAPS capsule Take 1 capsule (0.4 mg total) by mouth daily. 30 capsule 0   No current  facility-administered medications on file prior to visit.    No Known Allergies  Recent Results (from the past 2160 hour(s))  Glucose, capillary     Status: Abnormal   Collection Time: 09/06/16  2:16 PM  Result Value Ref Range   Glucose-Capillary 105 (H) 65 - 99 mg/dL  Basic metabolic panel     Status: Abnormal   Collection Time: 10/03/16  1:22 AM  Result Value Ref Range   Sodium 136 135 - 145 mmol/L   Potassium 4.1 3.5 - 5.1 mmol/L    Comment: SPECIMEN HEMOLYZED. HEMOLYSIS MAY AFFECT INTEGRITY OF RESULTS.   Chloride 99 (L) 101 - 111 mmol/L   CO2 23 22 - 32 mmol/L   Glucose, Bld 170 (H) 65 - 99 mg/dL   BUN 18 6 - 20 mg/dL   Creatinine, Ser 1.13 0.61 - 1.24 mg/dL   Calcium 9.0 8.9 - 10.3 mg/dL   GFR calc non Af Amer >60 >60 mL/min   GFR calc Af Amer >60 >60 mL/min    Comment: (NOTE) The eGFR has been calculated using the CKD EPI equation. This calculation has not been validated in all clinical situations. eGFR's persistently <60 mL/min signify possible Chronic Kidney Disease.    Anion gap 14 5 - 15  CBC with Differential     Status: Abnormal   Collection Time: 10/03/16  1:22 AM  Result Value Ref Range   WBC 12.0 (H) 4.0 - 10.5 K/uL   RBC 3.50 (L) 4.22 - 5.81 MIL/uL   Hemoglobin 10.5 (L)  13.0 - 17.0 g/dL   HCT 29.8 (L) 39.0 - 52.0 %   MCV 85.1 78.0 - 100.0 fL   MCH 30.0 26.0 - 34.0 pg   MCHC 35.2 30.0 - 36.0 g/dL   RDW 12.4 11.5 - 15.5 %   Platelets 304 150 - 400 K/uL   Neutrophils Relative % 81 %   Neutro Abs 9.8 (H) 1.7 - 7.7 K/uL   Lymphocytes Relative 14 %   Lymphs Abs 1.7 0.7 - 4.0 K/uL   Monocytes Relative 5 %   Monocytes Absolute 0.6 0.1 - 1.0 K/uL   Eosinophils Relative 0 %   Eosinophils Absolute 0.0 0.0 - 0.7 K/uL   Basophils Relative 0 %   Basophils Absolute 0.0 0.0 - 0.1 K/uL  Urinalysis, Routine w reflex microscopic     Status: Abnormal   Collection Time: 10/03/16  1:43 AM  Result Value Ref Range   Color, Urine YELLOW YELLOW   APPearance CLEAR CLEAR    Specific Gravity, Urine 1.012 1.005 - 1.030   pH 5.0 5.0 - 8.0   Glucose, UA 50 (A) NEGATIVE mg/dL   Hgb urine dipstick SMALL (A) NEGATIVE   Bilirubin Urine NEGATIVE NEGATIVE   Ketones, ur NEGATIVE NEGATIVE mg/dL   Protein, ur 100 (A) NEGATIVE mg/dL   Nitrite NEGATIVE NEGATIVE   Leukocytes, UA NEGATIVE NEGATIVE   RBC / HPF 6-30 0 - 5 RBC/hpf   WBC, UA 0-5 0 - 5 WBC/hpf   Bacteria, UA NONE SEEN NONE SEEN   Squamous Epithelial / LPF NONE SEEN NONE SEEN   Mucous PRESENT    Hyaline Casts, UA PRESENT   Urine culture     Status: None   Collection Time: 10/03/16  1:43 AM  Result Value Ref Range   Specimen Description URINE, RANDOM    Special Requests NONE    Culture NO GROWTH    Report Status 10/04/2016 FINAL     Objective: General: Patient is awake, alert, and oriented x 3 and in no acute distress.  Integument: Skin is warm, dry and supple bilateral. Nails are tender, long, thickened and dystrophic with subungual debris, consistent with onychomycosis, 1-5 bilateral. No signs of infection. No open lesions or preulcerative lesions present bilateral. Remaining integument unremarkable.  Vasculature:  Dorsalis Pedis pulse1/4 bilateral. Posterior Tibial pulse 1 /4 bilateral. Capillary fill time <3 sec 1-5 bilateral. No hair growth to the level of the digits.Temperature gradient within normal limits. No varicosities present bilateral. No edema present bilateral.   Neurology: The patient has absent sensation measured with a 5.07/10g Semmes Weinstein Monofilament at all pedal sites bilateral. Vibratory absent diminished bilateral with tuning fork. No Babinski sign present bilateral.   Musculoskeletal: No symptomatic pedal bony deformities noted bilateral. Muscular strength 5/5 in all lower extremity muscular groups bilateral without pain on range of motion. No tenderness with calf compression bilateral.  Assessment and Plan: Problem List Items Addressed This Visit    None    Visit  Diagnoses    Dermatophytosis of nail    -  Primary   Toe pain, bilateral       Diabetic polyneuropathy associated with type 2 diabetes mellitus ([place])          -Examined patient. -Discussed and educated patient on diabetic foot care, especially with  regards to the vascular, neurological and musculoskeletal systems.  -Stressed the importance of good glycemic control and the detriment of not controlling glucose levels in relation to the foot. -Mechanically debrided all nails 1-5 bilateral using  sterile nail nipper and filed with dremel without incident  -Answered all patient questions -Patient to return  in 3 months for at risk foot care -Patient advised to call the office if any problems or questions arise in the meantime.  Landis Martins, DPM

## 2016-10-24 DIAGNOSIS — H2512 Age-related nuclear cataract, left eye: Secondary | ICD-10-CM | POA: Diagnosis not present

## 2016-10-29 DIAGNOSIS — H25812 Combined forms of age-related cataract, left eye: Secondary | ICD-10-CM | POA: Diagnosis not present

## 2016-10-29 DIAGNOSIS — H2512 Age-related nuclear cataract, left eye: Secondary | ICD-10-CM | POA: Diagnosis not present

## 2016-10-31 MED FILL — PREDNISOLONE AC 1% EYE DROP: 1 | 25 days supply | Qty: 5 | Fill #1

## 2016-10-31 MED FILL — AMLODIPINE BESYLATE 10 MG T: 10 | 30 days supply | Qty: 30 | Fill #1

## 2016-10-31 MED FILL — LISINOPRIL-HCTZ 20-12.5 MG: 20-12.5 | 30 days supply | Qty: 60 | Fill #3

## 2016-10-31 MED FILL — ATORVASTATIN 80 MG TABLET: 80 | 30 days supply | Qty: 30 | Fill #9

## 2016-11-01 DIAGNOSIS — E113511 Type 2 diabetes mellitus with proliferative diabetic retinopathy with macular edema, right eye: Secondary | ICD-10-CM | POA: Diagnosis not present

## 2016-11-19 DIAGNOSIS — R338 Other retention of urine: Secondary | ICD-10-CM | POA: Diagnosis not present

## 2016-11-21 DIAGNOSIS — H35372 Puckering of macula, left eye: Secondary | ICD-10-CM | POA: Diagnosis not present

## 2016-11-21 DIAGNOSIS — E113521 Type 2 diabetes mellitus with proliferative diabetic retinopathy with traction retinal detachment involving the macula, right eye: Secondary | ICD-10-CM | POA: Diagnosis not present

## 2016-11-21 DIAGNOSIS — E113512 Type 2 diabetes mellitus with proliferative diabetic retinopathy with macular edema, left eye: Secondary | ICD-10-CM | POA: Diagnosis not present

## 2016-11-21 DIAGNOSIS — E113511 Type 2 diabetes mellitus with proliferative diabetic retinopathy with macular edema, right eye: Secondary | ICD-10-CM | POA: Diagnosis not present

## 2016-11-21 DIAGNOSIS — H35371 Puckering of macula, right eye: Secondary | ICD-10-CM | POA: Diagnosis not present

## 2016-11-27 ENCOUNTER — Other Ambulatory Visit: Payer: Self-pay | Admitting: Internal Medicine

## 2016-11-27 DIAGNOSIS — E1169 Type 2 diabetes mellitus with other specified complication: Secondary | ICD-10-CM

## 2016-11-27 DIAGNOSIS — B351 Tinea unguium: Secondary | ICD-10-CM

## 2016-11-27 DIAGNOSIS — E1165 Type 2 diabetes mellitus with hyperglycemia: Secondary | ICD-10-CM

## 2016-11-27 DIAGNOSIS — E118 Type 2 diabetes mellitus with unspecified complications: Principal | ICD-10-CM

## 2016-11-27 DIAGNOSIS — IMO0002 Reserved for concepts with insufficient information to code with codable children: Secondary | ICD-10-CM

## 2016-11-27 MED ORDER — GLIPIZIDE-METFORMIN HCL 2.5-500 MG PO TABS: ORAL_TABLET | ORAL | 11 refills | Status: DC

## 2016-11-27 MED FILL — GLIPIZIDE-METFORMIN 2.5-500: 2.5-500 | 30 days supply | Qty: 120 | Fill #0

## 2016-11-27 MED FILL — LISINOPRIL-HCTZ 20-12.5 MG: 20-12.5 | 30 days supply | Qty: 60 | Fill #4

## 2016-11-27 MED FILL — AMLODIPINE BESYLATE 10 MG T: 10 | 30 days supply | Qty: 30 | Fill #2

## 2016-11-27 MED FILL — ATORVASTATIN 80 MG TABLET: 80 | 30 days supply | Qty: 30 | Fill #10

## 2016-11-27 NOTE — Addendum Note (Signed)
Addended by: Forde Dandy on: 11/27/2016 01:48 PM   Modules accepted: Orders

## 2016-12-06 DIAGNOSIS — E113521 Type 2 diabetes mellitus with proliferative diabetic retinopathy with traction retinal detachment involving the macula, right eye: Secondary | ICD-10-CM | POA: Diagnosis not present

## 2016-12-06 DIAGNOSIS — E113512 Type 2 diabetes mellitus with proliferative diabetic retinopathy with macular edema, left eye: Secondary | ICD-10-CM | POA: Diagnosis not present

## 2016-12-06 DIAGNOSIS — H35372 Puckering of macula, left eye: Secondary | ICD-10-CM | POA: Diagnosis not present

## 2016-12-06 DIAGNOSIS — E113511 Type 2 diabetes mellitus with proliferative diabetic retinopathy with macular edema, right eye: Secondary | ICD-10-CM | POA: Diagnosis not present

## 2016-12-13 DIAGNOSIS — E113511 Type 2 diabetes mellitus with proliferative diabetic retinopathy with macular edema, right eye: Secondary | ICD-10-CM | POA: Diagnosis not present

## 2016-12-19 ENCOUNTER — Encounter: Payer: Self-pay | Admitting: Nurse Practitioner

## 2016-12-19 ENCOUNTER — Ambulatory Visit (INDEPENDENT_AMBULATORY_CARE_PROVIDER_SITE_OTHER): Payer: Medicare Other | Admitting: Nurse Practitioner

## 2016-12-19 VITALS — BP 122/71 | Wt 166.8 lb

## 2016-12-19 DIAGNOSIS — E785 Hyperlipidemia, unspecified: Secondary | ICD-10-CM

## 2016-12-19 DIAGNOSIS — Z8673 Personal history of transient ischemic attack (TIA), and cerebral infarction without residual deficits: Secondary | ICD-10-CM

## 2016-12-19 DIAGNOSIS — I10 Essential (primary) hypertension: Secondary | ICD-10-CM | POA: Diagnosis not present

## 2016-12-19 NOTE — Patient Instructions (Addendum)
Continue aspirin 81 mg daily  for secondary stroke prevention maintain strict control of hypertension with blood pressure goal below 130/90,  today's reading 122/71  lipids with LDL cholesterol goal below 70 mg/dL. continue Lipitor healthy diet with plenty of whole grains, cereals, fruits and vegetables, exercise regularly by walking  and maintain ideal body weight  Will discharge  Stroke Prevention Some health problems and behaviors may make it more likely for you to have a stroke. Below are ways to lessen your risk of having a stroke.  Be active for at least 30 minutes on most or all days.  Do not smoke. Try not to be around others who smoke.  Do not drink too much alcohol. ? Do not have more than 2 drinks a day if you are a man. ? Do not have more than 1 drink a day if you are a woman and are not pregnant.  Eat healthy foods, such as fruits and vegetables. If you were put on a specific diet, follow the diet as told.  Keep your cholesterol levels under control through diet and medicines. Look for foods that are low in saturated fat, trans fat, cholesterol, and are high in fiber.  If you have diabetes, follow all diet plans and take your medicine as told.  Ask your doctor if you need treatment to lower your blood pressure. If you have high blood pressure (hypertension), follow all diet plans and take your medicine as told by your doctor.  If you are 37-35 years old, have your blood pressure checked every 3-5 years. If you are age 25 or older, have your blood pressure checked every year.  Keep a healthy weight. Eat foods that are low in calories, salt, saturated fat, trans fat, and cholesterol.  Do not take drugs.  Avoid birth control pills, if this applies. Talk to your doctor about the risks of taking birth control pills.  Talk to your doctor if you have sleep problems (sleep apnea).  Take all medicine as told by your doctor. ? You may be told to take aspirin or blood thinner  medicine. Take this medicine as told by your doctor. ? Understand your medicine instructions.  Make sure any other conditions you have are being taken care of.  Get help right away if:  You suddenly lose feeling (you feel numb) or have weakness in your face, arm, or leg.  Your face or eyelid hangs down to one side.  You suddenly feel confused.  You have trouble talking (aphasia) or understanding what people are saying.  You suddenly have trouble seeing in one or both eyes.  You suddenly have trouble walking.  You are dizzy.  You lose your balance or your movements are clumsy (uncoordinated).  You suddenly have a very bad headache and you do not know the cause.  You have new chest pain.  Your heart feels like it is fluttering or skipping a beat (irregular heartbeat). Do not wait to see if the symptoms above go away. Get help right away. Call your local emergency services (911 in [place] Do not drive yourself to the hospital. This information is not intended to replace advice given to you by your health care provider. Make sure you discuss any questions you have with your health care provider. Document Released: 12/25/2011 Document Revised: 12/01/2015 Document Reviewed: 12/26/2012 Elsevier Interactive Patient Education  [employer].

## 2016-12-19 NOTE — Progress Notes (Signed)
[facility]  PATIENT: Dennis Macias DOB: 08-09-1950   REASON FOR VISIT: Follow-up for history of stroke  HISTORY FROM: Patient and wife    HISTORY OF PRESENT ILLNESS:UPDATE 06/13/2018CM Dennis Macias, , 66 year old male returns for follow-up with a history of stroke event in March 2017 lacunar infarct. He has risk factors of hypertension hyperlipidemia and diabetes. He is currently on aspirin for secondary stroke prevention without further stroke or TIA symptoms. He has no bruising and no bleeding. He remains on Lipitor without myalgias. Most recent hemoglobin A1c 6.1. He gets little exercise. Blood pressure in the office today 122/70. He returns for reevaluation without further neurologic complaints   UPDATE 12/12/2017CM Dennis Macias, 66 year old male returns for follow-up after hospital admission in March lacunar infarct. He has a history of diabetes hypertension and hyperlipidemia. He remains on aspirin for secondary stroke prevention with no bruising and no bleeding. He remains on Lipitor 80 mg daily without complaints of muscle aches. His most recent random glucose was 79 on 03/22/2016. Blood pressure in the office today 124/70. He is due to have cataract surgery in his right eye Friday of this week. He returns for reevaluation, he has no new neurologic complaints   HISTORY 5/22/17PS73 year old Caucasian male seen today for first office follow-up visit following hospital admission for stroke in March 2017.Dennis Macias is an 66 y.o. male who has Hx DM and was with his family. At 1310 today, 09/12/2015 he noted sudden onset of difficulty forming his words and some imbalance. HE was brought to [facility] as code stroke. BP 230/120 but symptoms had resolved. EKG showing possible ST elevation. CT head negative for stroke. Patient placed on TIA alert and given 20 mg Labetalol, his blood pressure in the ED was 223/107. He does have decreased vision in his right eye but this is old. He did not  experience focal weakness no numbness involving face or extremities. Patient was not administered IV t-PA secondary to symptoms resolved. He was admitted for further evaluation and treatment. CT scan of the brain on admission showed only mild age-related changes. MRI scan showed 2 small acute lacunar infarcts in the right mid to posterior frontal lobe and a small acute infarct in the left anterior medial left frontal lobe. MRA of the brain showed no significant large vessel stenosis left terminal vertebral artery was not and irregular. LDL cholesterol is elevated at 148 and hemoglobin A1c at 10.7. Transesophageal echocardiogram showed normal ejection fraction of 60-65% and no cardiac source of embolism or PFO. Carotid Doppler showed no significant extracranial stenosis. Patient was started on aspirin for stroke prevention and Lipitor. He states his done well since discharge his had no recurrent with recurrent stroke or TIA symptoms and was made a full recovery. He states his blood pressure continues to fluctuate a lot and is usually in the 170 range. Today it is [phone]. He has been seen at his medical follow-up clinic and medications are being adjusted. His fasting sugars also continued to be uncontrolled and ranged in the 160-200 range. He is tolerating Lipitor well without muscle aches or pains. Starting aspirin without bruising or bleeding. He has no new neurological complaints today.   REVIEW OF SYSTEMS: Full 14 system review of systems performed and notable only for those listed, all others are neg:  Constitutional: neg  Cardiovascular: neg Ear/Nose/Throat: neg  Skin: neg Eyes: Cataract right eye Respiratory: neg Gastroitestinal: neg  Hematology/Lymphatic: neg  Endocrine: neg Musculoskeletal:neg Allergy/Immunology: neg Neurological: neg Psychiatric: neg Sleep :  neg   ALLERGIES: No Known Allergies  HOME MEDICATIONS: Outpatient Medications Prior to Visit  Medication Sig Dispense Refill    . amLODipine (NORVASC) 10 MG tablet Take 1 tablet (10 mg total) by mouth daily. 90 tablet 0  . aspirin 81 MG tablet Take 1 tablet (81 mg total) by mouth daily. Internal Medicine Program place on hold until patient requests to fill 90 tablet 3  . atorvastatin (LIPITOR) 80 MG tablet Take 1 tablet (80 mg total) by mouth daily at 6 PM. Internal Medicine Program 90 tablet 3  . glipiZIDE-metformin (METAGLIP) 2.5-500 MG tablet TAKE 2 TABLETS BY MOUTH TWICE DAILY BEFORE MEALS. IM program 120 tablet 11  . lisinopril-hydrochlorothiazide (PRINZIDE,ZESTORETIC) 20-12.5 MG tablet TAKE 2 TABLETS BY MOUTH DAILY 180 tablet 1  . docusate sodium (COLACE) 100 MG capsule Take 1 capsule (100 mg total) by mouth every 12 (twelve) hours. (Patient not taking: Reported on 12/19/2016) 60 capsule 0  . polyethylene glycol (MIRALAX / GLYCOLAX) packet Take 17 g by mouth daily. (Patient not taking: Reported on 12/19/2016) 14 each 0  . tamsulosin (FLOMAX) 0.4 MG CAPS capsule Take 1 capsule (0.4 mg total) by mouth daily. (Patient not taking: Reported on 12/19/2016) 30 capsule 0   No facility-administered medications prior to visit.     PAST MEDICAL HISTORY: Past Medical History:  Diagnosis Date  . Diabetes ([place])   . Hyperlipidemia   . Hypertension   . Stroke [facility])     PAST SURGICAL HISTORY: Past Surgical History:  Procedure Laterality Date  . TEE WITHOUT CARDIOVERSION N/A 09/14/2015   Procedure: TRANSESOPHAGEAL ECHOCARDIOGRAM (TEE);  Surgeon: Larey Dresser, MD;  [facility] ENDOSCOPY;  Service: Cardiovascular;  Laterality: N/A;    FAMILY HISTORY: Family History  Problem Relation Age of Onset  . Hypertension Mother   . Hyperlipidemia Mother   . Hyperlipidemia Father     SOCIAL HISTORY: Social History   Social History  . Marital status: Married    Spouse name: N/A  . Number of children: N/A  . Years of education: N/A   Occupational History  . Not on file.   Social History Main Topics  . Smoking status:  Former Research scientist (life sciences)  . Smokeless tobacco: Current User    Types: Chew  . Alcohol use 0.6 oz/week    1 Cans of beer per week     Comment: 1 6 pack  on occasionally   . Drug use: No  . Sexual activity: Not on file   Other Topics Concern  . Not on file   Social History Narrative  . No narrative on file     PHYSICAL EXAM  Vitals:   12/19/16 1404  Weight: 166 lb 12.8 oz (75.7 kg)   Body mass index is 22.62 kg/m. General: well developed, well nourished middle aged  male, seated, in no evident distress Head: head normocephalic and atraumatic.  Neck: supple with no carotid bruits Cardiovascular: regular rate and rhythm, no murmurs Musculoskeletal: no deformity Skin:  no rash/petichiae    Neurological examination  Mental Status: Awake and fully alert. Oriented to place and time. Recent and remote memory intact. Attention span, concentration and fund of knowledge appropriate. Mood and affect appropriate.  Cranial Nerves: Extraocular movements full without nystagmus. Visual fields full to confrontation. Hearing intact. Facial sensation intact. Face, tongue, palate moves normally and symmetrically.  Motor: Normal bulk and tone. Normal strength in all tested extremity muscles. Sensory.: intact to touch ,pinprick .position and vibratory sensation. In the upper and lower  extremities except absent vibratory to ankles and below bilaterally Coordination: Rapid alternating movements normal in all extremities. Finger-to-nose and heel-to-shin performed accurately bilaterally. Gait and Station: Arises from chair without difficulty. Stance is normal. Gait demonstrates normal stride length and balance . Able  to heel, toe and mildly unsteady with tandem walk .  Reflexes: 1+ and symmetric. Toes downgoing.    DIAGNOSTIC DATA (LABS, IMAGING, TESTING) - I reviewed patient records, labs, notes, testing and imaging myself where available.  Lab Results  Component Value Date   WBC 12.0 (H) 10/03/2016    HGB 10.5 (L) 10/03/2016   HCT 29.8 (L) 10/03/2016   MCV 85.1 10/03/2016   PLT 304 10/03/2016      Component Value Date/Time   NA 136 10/03/2016 0122   NA 142 05/14/2016 1552   K 4.1 10/03/2016 0122   CL 99 (L) 10/03/2016 0122   CO2 23 10/03/2016 0122   GLUCOSE 170 (H) 10/03/2016 0122   BUN 18 10/03/2016 0122   BUN 14 05/14/2016 1552   CREATININE 1.13 10/03/2016 0122   CALCIUM 9.0 10/03/2016 0122   PROT 6.4 (L) 09/12/2015 1347   ALBUMIN 3.2 (L) 09/12/2015 1347   AST 13 (L) 09/12/2015 1347   ALT 10 (L) 09/12/2015 1347   ALKPHOS 66 09/12/2015 1347   BILITOT 1.0 09/12/2015 1347   GFRNONAA >60 10/03/2016 0122   GFRAA >60 10/03/2016 0122   Lab Results  Component Value Date   CHOL 234 (H) 09/13/2015   HDL 49 09/13/2015   LDLCALC 148 (H) 09/13/2015   TRIG 183 (H) 09/13/2015   CHOLHDL 4.8 09/13/2015   Lab Results  Component Value Date   HGBA1C 6.1 03/22/2016    ASSESSMENT AND PLAN 16 year Caucasian male with bifrontal lacunar infarcts in March 2017 secondary to small vessel disease with vascular risk factors of hypertension, diabetes, hyperlipidemia.The patient is a current patient of Dr.Sethi  who is out of the office today . This note is sent to the work in doctor.     PLAN:Continue aspirin 81 mg daily  for secondary stroke prevention maintain strict control of hypertension with blood pressure goal below 130/90,  today's reading 122/71  lipids with LDL cholesterol goal below 70 mg/dL. continue Lipitor Diabetes with hemoglobin A1c below 6.5 continue glipizide healthy diet with plenty of whole grains, cereals, fruits and vegetables, exercise regularly by walking  and maintain ideal body weight  Will discharge I spent 20 min  in total face to face time with the patient more than 50% of which was spent counseling and coordination of care, reviewing test results reviewing medications and discussing and reviewing the diagnosis of stroke and management of risk factors. Written  information given , Dennis Macias, [facility], [facility], APRN  [facility] [address], [place],  [address] [phone]

## 2016-12-19 NOTE — Progress Notes (Signed)
I have read the note, and I agree with the clinical assessment and plan.  Richard A. Sater, MD, PhD, FAAN Certified in Neurology, Clinical Neurophysiology, Sleep Medicine, Pain Medicine and Neuroimaging  [facility] [address], [address] [place], [place] [address] [phone]  

## 2016-12-20 ENCOUNTER — Ambulatory Visit (INDEPENDENT_AMBULATORY_CARE_PROVIDER_SITE_OTHER): Payer: Medicare Other | Admitting: Internal Medicine

## 2016-12-20 ENCOUNTER — Encounter: Payer: Self-pay | Admitting: Internal Medicine

## 2016-12-20 VITALS — BP 142/79 | HR 81 | Temp 98.7°F | Ht 72.0 in | Wt 167.4 lb

## 2016-12-20 DIAGNOSIS — Z23 Encounter for immunization: Secondary | ICD-10-CM

## 2016-12-20 DIAGNOSIS — I10 Essential (primary) hypertension: Secondary | ICD-10-CM

## 2016-12-20 DIAGNOSIS — Z0000 Encounter for general adult medical examination without abnormal findings: Secondary | ICD-10-CM | POA: Diagnosis not present

## 2016-12-20 DIAGNOSIS — Z8673 Personal history of transient ischemic attack (TIA), and cerebral infarction without residual deficits: Secondary | ICD-10-CM

## 2016-12-20 DIAGNOSIS — E11319 Type 2 diabetes mellitus with unspecified diabetic retinopathy without macular edema: Secondary | ICD-10-CM

## 2016-12-20 DIAGNOSIS — D539 Nutritional anemia, unspecified: Secondary | ICD-10-CM | POA: Diagnosis not present

## 2016-12-20 DIAGNOSIS — D649 Anemia, unspecified: Secondary | ICD-10-CM | POA: Diagnosis not present

## 2016-12-20 LAB — GLUCOSE, CAPILLARY: Glucose-Capillary: 127 mg/dL — ABNORMAL HIGH (ref 65–99)

## 2016-12-20 LAB — POCT GLYCOSYLATED HEMOGLOBIN (HGB A1C): Hemoglobin A1C: 5.7

## 2016-12-20 MED ORDER — AMLODIPINE BESYLATE 10 MG PO TABS: 10.0000 mg | ORAL_TABLET | Freq: Every day | ORAL | 1 refills | Status: DC

## 2016-12-20 MED FILL — LISINOPRIL-HCTZ 20-12.5 MG: 20-12.5 | 30 days supply | Qty: 60 | Fill #5

## 2016-12-20 MED FILL — AMLODIPINE BESYLATE 10 MG T: 10 | 30 days supply | Qty: 30 | Fill #0

## 2016-12-20 MED FILL — GLIPIZIDE-METFORMIN 2.5-500: 2.5-500 | 30 days supply | Qty: 120 | Fill #1

## 2016-12-20 NOTE — Assessment & Plan Note (Signed)
BP Readings from Last 3 Encounters:  12/20/16 (!) 142/79  12/19/16 122/71  10/03/16 (!) 165/90   Assessment: BP just above goal today at 142/79.  Seen by podiatry yesterday and was 122/71.  He has not problems with his medications and states he takes regularly.  He otherwise feels well today without complaints.  Plan: - Continue lisinopril-HCTZ 40-25mg  daily - Continue amlodipine 10mg  daily.  New Rx sent to pharmacy. - RTC 6 months

## 2016-12-20 NOTE — Assessment & Plan Note (Signed)
Lab Results  Component Value Date   HGBA1C 5.7 12/20/2016   Assessment: His glycemic control is now at an A1c of 5.7 down from 6.1 previously.  He is only on oral medications at this time and does not check his blood sugars at home.  He is on glipizide-metformin 5-1000mg  BID.  He does not report any symptomatic hypoglycemia.  Plan: - RTC 6 months and repeat A1c.  If remains less than 6 will recommend discontinuing glipizide and continuing metformin alone to avoid too strict of glycemic control - for now, continue glipizide-metformin 5-1000mg  BID

## 2016-12-20 NOTE — Progress Notes (Addendum)
   CC: here for DM and HTN follow up  HPI:  [name] is a 66 y.o. man with a past medical history listed below here today for follow up of his DM and HTN.   For details of today's visit and the status of his chronic medical issues please refer to the assessment and plan.   Past Medical History:  Diagnosis Date  . Diabetes ([place])   . Hyperlipidemia   . Hypertension   . Stroke [facility])     Review of Systems:  Please see pertinent ROS reviewed in HPI and problem based charting.   Physical Exam:  Vitals:   12/20/16 1342  BP: (!) 142/79  Pulse: 81  Temp: 98.7 F (37.1 C)  TempSrc: Oral  SpO2: 100%  Weight: 167 lb 6.4 oz (75.9 kg)  Height: 6' (1.829 m)   General: NAD HEENT: [place]/AT, EOMI, no scleral icterus Cardiac: RRR Pulm: clear to auscultation bilaterally Ext: warm and well perfused, no pedal edema Neuro: alert and oriented X3, cranial nerves II-XII grossly intact   Assessment & Plan:   See Encounters Tab for problem based charting.  Patient discussed with Dr. Dareen Macias .  Hypertension BP Readings from Last 3 Encounters:  12/20/16 (!) 142/79  12/19/16 122/71  10/03/16 (!) 165/90   Assessment: BP just above goal today at 142/79.  Seen by podiatry yesterday and was 122/71.  He has not problems with his medications and states he takes regularly.  He otherwise feels well today without complaints.  Plan: - Continue lisinopril-HCTZ 40-25mg  daily - Continue amlodipine 10mg  daily.  New Rx sent to pharmacy. - RTC 6 months  Diabetes mellitus with retinopathy of both eyes ([place]) Lab Results  Component Value Date   HGBA1C 5.7 12/20/2016   Assessment: His glycemic control is now at an A1c of 5.7 down from 6.1 previously.  He is only on oral medications at this time and does not check his blood sugars at home.  He is on glipizide-metformin 5-1000mg  BID.  He does not report any symptomatic hypoglycemia.  Plan: - RTC 6 months and repeat A1c.  If remains less than 6 will  recommend discontinuing glipizide and continuing metformin alone to avoid too strict of glycemic control - for now, continue glipizide-metformin 5-1000mg  BID  History of CVA (cerebrovascular accident) Assessment: Doing well without any current complaints.  He is adherent to ASA and statin for secondary prevention.  Plan: - continue ASA and statin - continue control of BP and DM  Healthcare maintenance Assessment/Plan: - Tdap done today - Referral to GI for colon cancer screen - A1c done today.  Anemia CBC Latest Ref Rng & Units 12/20/2016 10/03/2016 09/12/2015  WBC 3.4 - 10.8 x10E3/uL 6.9 12.0(H) -  Hemoglobin 13.0 - 17.7 g/dL 10.9(L) 10.5(L) 16.7  Hematocrit 37.5 - 51.0 % 30.7(L) 29.8(L) 49.0  Platelets 150 - 379 x10E3/uL 362 304 -   Assessment: Hemoglobin in March 2017 was normal.  No priors to compare to.  During march 2018 ED visit, Hbg was 10.5 with normal MCV.  Today it is stable at 10.9 with normal MCV.  Unclear if this is more close to his baseline vs March 2017 readings.  He reports no weight loss, bleeding, melena, SOB.  He otherwise feels well.  Plan: - obtain iron, ferritin, folate and B12

## 2016-12-20 NOTE — Assessment & Plan Note (Signed)
Assessment/Plan: - Tdap done today - Referral to GI for colon cancer screen - A1c done today.

## 2016-12-20 NOTE — Assessment & Plan Note (Signed)
Assessment: Doing well without any current complaints.  He is adherent to ASA and statin for secondary prevention.  Plan: - continue ASA and statin - continue control of BP and DM

## 2016-12-20 NOTE — Patient Instructions (Signed)
Thank you for coming to see me today. It was a pleasure. Today we talked about:   Blood pressure and Diabetes; you are doing well controlling these problems.  Keep up the good work.  I have refilled your Amlodipine for blood pressure.  Please let me know if you need any other refills.  I have ordered a referral for you to get your colonoscopy.  They will call you to set this up.  Please follow-up with me in 6 months.  If you have any questions or concerns, please do not hesitate to call the office at [phone].  Take Care,   Jule Ser, DO

## 2016-12-21 ENCOUNTER — Encounter: Payer: Self-pay | Admitting: Gastroenterology

## 2016-12-21 DIAGNOSIS — D649 Anemia, unspecified: Secondary | ICD-10-CM | POA: Insufficient documentation

## 2016-12-21 LAB — CBC
Hematocrit: 30.7 % — ABNORMAL LOW (ref 37.5–51.0)
Hemoglobin: 10.9 g/dL — ABNORMAL LOW (ref 13.0–17.7)
MCH: 29.8 pg (ref 26.6–33.0)
MCHC: 35.5 g/dL (ref 31.5–35.7)
MCV: 84 fL (ref 79–97)
Platelets: 362 10*3/uL (ref 150–379)
RBC: 3.66 x10E6/uL — ABNORMAL LOW (ref 4.14–5.80)
RDW: 14.6 % (ref 12.3–15.4)
WBC: 6.9 10*3/uL (ref 3.4–10.8)

## 2016-12-21 LAB — BMP8+ANION GAP
Anion Gap: 17 mmol/L (ref 10.0–18.0)
BUN/Creatinine Ratio: 14 (ref 10–24)
BUN: 17 mg/dL (ref 8–27)
CO2: 24 mmol/L (ref 20–29)
Calcium: 9.6 mg/dL (ref 8.6–10.2)
Chloride: 104 mmol/L (ref 96–106)
Creatinine, Ser: 1.23 mg/dL (ref 0.76–1.27)
GFR calc Af Amer: 71 mL/min/{1.73_m2} (ref >59)
GFR calc non Af Amer: 61 mL/min/{1.73_m2} (ref >59)
Glucose: 84 mg/dL (ref 65–99)
Potassium: 3.6 mmol/L (ref 3.5–5.2)
Sodium: 145 mmol/L — ABNORMAL HIGH (ref 134–144)

## 2016-12-21 NOTE — Assessment & Plan Note (Signed)
CBC Latest Ref Rng & Units 12/20/2016 10/03/2016 09/12/2015  WBC 3.4 - 10.8 x10E3/uL 6.9 12.0(H) -  Hemoglobin 13.0 - 17.7 g/dL 10.9(L) 10.5(L) 16.7  Hematocrit 37.5 - 51.0 % 30.7(L) 29.8(L) 49.0  Platelets 150 - 379 x10E3/uL 362 304 -   Assessment: Hemoglobin in March 2017 was normal.  No priors to compare to.  During march 2018 ED visit, Hbg was 10.5 with normal MCV.  Today it is stable at 10.9 with normal MCV.  Unclear if this is more close to his baseline vs March 2017 readings.  He reports no weight loss, bleeding, melena, SOB.  He otherwise feels well.  Plan: - obtain iron, ferritin, folate and B12

## 2016-12-21 NOTE — Addendum Note (Signed)
Addended by: Mignon Pine on: 12/21/2016 06:57 AM   Modules accepted: Orders

## 2016-12-22 LAB — FERRITIN: Ferritin: 412 ng/mL — ABNORMAL HIGH (ref 30–400)

## 2016-12-22 LAB — IRON AND TIBC
Iron Saturation: 43 % (ref 15–55)
Iron: 89 ug/dL (ref 38–169)
Total Iron Binding Capacity: 208 ug/dL — ABNORMAL LOW (ref 250–450)
UIBC: 119 ug/dL (ref 111–343)

## 2016-12-22 LAB — SPECIMEN STATUS REPORT

## 2016-12-22 LAB — VITAMIN B12: Vitamin B-12: 346 pg/mL (ref 232–1245)

## 2017-01-02 NOTE — Progress Notes (Signed)
Internal Medicine Clinic Attending  Case discussed with Dr. Wallace soon after the resident saw the patient.  We reviewed the resident's history and exam and pertinent patient test results.  I agree with the assessment, diagnosis, and plan of care documented in the resident's note. 

## 2017-01-08 MED FILL — ATORVASTATIN 80 MG TABLET: 80 | 30 days supply | Qty: 30 | Fill #11

## 2017-01-22 ENCOUNTER — Ambulatory Visit (INDEPENDENT_AMBULATORY_CARE_PROVIDER_SITE_OTHER): Payer: Medicare Other | Admitting: Sports Medicine

## 2017-01-22 DIAGNOSIS — M79675 Pain in left toe(s): Secondary | ICD-10-CM | POA: Diagnosis not present

## 2017-01-22 DIAGNOSIS — M79674 Pain in right toe(s): Secondary | ICD-10-CM

## 2017-01-22 DIAGNOSIS — E1142 Type 2 diabetes mellitus with diabetic polyneuropathy: Secondary | ICD-10-CM | POA: Diagnosis not present

## 2017-01-22 DIAGNOSIS — B351 Tinea unguium: Secondary | ICD-10-CM | POA: Diagnosis not present

## 2017-01-22 NOTE — Progress Notes (Signed)
Subjective: Dennis Macias is a 66 y.o. male patient with history of diabetes who presents to office today complaining of long, painful nails  while ambulating in shoes; unable to trim. Patient states that the glucose reading this morning was not recorded but A1C has been "fine". Patient admits increase in blood pressure medication. No other issues noted.   Patient Active Problem List   Diagnosis Date Noted  . Anemia 12/21/2016  . Diabetic retinopathy ([place]) 06/07/2016  . 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 Onychomycosis of multiple toenails with type 2 diabetes mellitus ([place]) 03/22/2016  . Healthcare maintenance 12/22/2015  . History of CVA (cerebrovascular accident) 09/13/2015  . Hyperlipidemia 09/13/2015  . 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 Hypertension    Current Outpatient Prescriptions on File Prior to Visit  Medication Sig Dispense Refill  . amLODipine (NORVASC) 10 MG tablet Take 1 tablet (10 mg total) by mouth daily. 90 tablet 1  . aspirin 81 MG tablet Take 1 tablet (81 mg total) by mouth daily. Internal Medicine Program place on hold until patient requests to fill 90 tablet 3  . atorvastatin (LIPITOR) 80 MG tablet Take 1 tablet (80 mg total) by mouth daily at 6 PM. Internal Medicine Program 90 tablet 3  . glipiZIDE-metformin (METAGLIP) 2.5-500 MG tablet TAKE 2 TABLETS BY MOUTH TWICE DAILY BEFORE MEALS. IM program 120 tablet 11  . lisinopril-hydrochlorothiazide (PRINZIDE,ZESTORETIC) 20-12.5 MG tablet TAKE 2 TABLETS BY MOUTH DAILY 180 tablet 1   No current facility-administered medications on file prior to visit.    No Known Allergies  Recent Results (from the past 2160 hour(s))  Glucose, capillary     Status: Abnormal   Collection Time: 12/20/16  1:36 PM  Result Value Ref Range   Glucose-Capillary 127 (H) 65 - 99 mg/dL  POC Hbg A1C     Status: None   Collection Time: 12/20/16  1:52 PM  Result Value Ref Range   Hemoglobin  A1C 5.7   BMP8+Anion Gap     Status: Abnormal   Collection Time: 12/20/16  2:33 PM  Result Value Ref Range   Glucose 84 65 - 99 mg/dL   BUN 17 8 - 27 mg/dL   Creatinine, Ser 1.23 0.76 - 1.27 mg/dL   GFR calc non Af Amer 61 >59 mL/min/1.73   GFR calc Af Amer 71 >59 mL/min/1.73   BUN/Creatinine Ratio 14 10 - 24   Sodium 145 (H) 134 - 144 mmol/L   Potassium 3.6 3.5 - 5.2 mmol/L   Chloride 104 96 - 106 mmol/L   CO2 24 20 - 29 mmol/L    Comment:               **Please note reference interval change**   Anion Gap 17.0 10.0 - 18.0 mmol/L   Calcium 9.6 8.6 - 10.2 mg/dL  CBC no Diff     Status: Abnormal   Collection Time: 12/20/16  2:33 PM  Result Value Ref Range   WBC 6.9 3.4 - 10.8 x10E3/uL   RBC 3.66 (L) 4.14 - 5.80 x10E6/uL   Hemoglobin 10.9 (L) 13.0 - 17.7 g/dL   Hematocrit 30.7 (L) 37.5 - 51.0 %   MCV 84 79 - 97 fL   MCH 29.8 26.6 - 33.0 pg   MCHC 35.5 31.5 - 35.7 g/dL   RDW 14.6 12.3 - 15.4 %   Platelets 362 150 - 379 x10E3/uL  Iron and TIBC     Status: Abnormal  Collection Time: 12/20/16  2:33 PM  Result Value Ref Range   Total Iron Binding Capacity 208 (L) 250 - 450 ug/dL   UIBC 119 111 - 343 ug/dL   Iron 89 38 - 169 ug/dL   Iron Saturation 43 15 - 55 %  Ferritin     Status: Abnormal   Collection Time: 12/20/16  2:33 PM  Result Value Ref Range   Ferritin 412 (H) 30 - 400 ng/mL  Vitamin B12     Status: None   Collection Time: 12/20/16  2:33 PM  Result Value Ref Range   Vitamin B-12 346 232 - 1,245 pg/mL  Specimen status report     Status: None   Collection Time: 12/20/16  2:33 PM  Result Value Ref Range   specimen status report Comment     Comment: Written Authorization Written Authorization Written Authorization Received. Authorization received from [facility] 12-21-2016 Logged by Mariel Craft     Objective: General: Patient is awake, alert, and oriented x 3 and in no acute distress.  Integument: Skin is warm, dry and supple bilateral. Nails are  tender, long, thickened and dystrophic with subungual debris, consistent with onychomycosis, 1-5 bilateral. No signs of infection. No open lesions or preulcerative lesions present bilateral. Remaining integument unremarkable.  Vasculature:  Dorsalis Pedis pulse1/4 bilateral. Posterior Tibial pulse 1 /4 bilateral. Capillary fill time <3 sec 1-5 bilateral. No hair growth to the level of the digits.Temperature gradient within normal limits. No varicosities present bilateral. No edema present bilateral.   Neurology: The patient has absent sensation measured with a 5.07/10g Semmes Weinstein Monofilament at all pedal sites bilateral. Vibratory absent diminished bilateral with tuning fork. No Babinski sign present bilateral.   Musculoskeletal: No symptomatic pedal bony deformities noted bilateral. Muscular strength 5/5 in all lower extremity muscular groups bilateral without pain on range of motion. No tenderness with calf compression bilateral.  Assessment and Plan: Problem List Items Addressed This Visit    None    Visit Diagnoses    Dermatophytosis of nail    -  Primary   Toe pain, bilateral       Diabetic polyneuropathy associated with type 2 diabetes mellitus ([place])          -Examined patient. -Discussed and educated patient on diabetic foot care, especially with  regards to the vascular, neurological and musculoskeletal systems.  -Stressed the importance of good glycemic control and the detriment of not controlling glucose levels in relation to the foot. -Mechanically debrided all nails 1-5 bilateral using sterile nail nipper and filed with dremel without incident  -Answered all patient questions -Patient to return  in 3 months for at risk foot care -Patient advised to call the office if any problems or questions arise in the meantime.  Landis Martins, DPM

## 2017-01-28 ENCOUNTER — Other Ambulatory Visit: Payer: Self-pay | Admitting: Internal Medicine

## 2017-01-28 DIAGNOSIS — E1165 Type 2 diabetes mellitus with hyperglycemia: Secondary | ICD-10-CM

## 2017-01-28 DIAGNOSIS — IMO0002 Reserved for concepts with insufficient information to code with codable children: Secondary | ICD-10-CM

## 2017-01-28 DIAGNOSIS — E1169 Type 2 diabetes mellitus with other specified complication: Secondary | ICD-10-CM

## 2017-01-28 DIAGNOSIS — E118 Type 2 diabetes mellitus with unspecified complications: Secondary | ICD-10-CM

## 2017-01-28 DIAGNOSIS — B351 Tinea unguium: Secondary | ICD-10-CM

## 2017-01-28 DIAGNOSIS — I10 Essential (primary) hypertension: Secondary | ICD-10-CM

## 2017-01-28 DIAGNOSIS — E785 Hyperlipidemia, unspecified: Secondary | ICD-10-CM

## 2017-01-28 MED FILL — GLIPIZIDE-METFORMIN 2.5-500: 2.5-500 | 30 days supply | Qty: 120 | Fill #2

## 2017-01-30 ENCOUNTER — Telehealth: Payer: Self-pay | Admitting: Pharmacist

## 2017-01-30 MED ORDER — ATORVASTATIN CALCIUM 80 MG PO TABS: 80.0000 mg | ORAL_TABLET | Freq: Every day | ORAL | 0 refills | Status: DC

## 2017-01-30 MED FILL — ATORVASTATIN 80 MG TABLET: 80 | 30 days supply | Qty: 30 | Fill #0

## 2017-01-30 NOTE — Telephone Encounter (Signed)
Patient called to check on status of atorvastatin refill. Informed patient refill was successfully approved by PCP and confirmed by pharmacy. Patient verbalized understanding. Pharmacy states patient has run out of refills on his other maintenance medications as well, refill requests sent to PCP

## 2017-01-30 NOTE — Addendum Note (Signed)
Addended by: Forde Dandy on: 01/30/2017 03:06 PM   Modules accepted: Orders

## 2017-01-30 NOTE — Addendum Note (Signed)
Addended by: Forde Dandy on: 01/30/2017 03:10 PM   Modules accepted: Orders

## 2017-01-31 MED ORDER — AMLODIPINE BESYLATE 10 MG PO TABS: 10.0000 mg | ORAL_TABLET | Freq: Every day | ORAL | 1 refills | Status: DC

## 2017-01-31 MED ORDER — LISINOPRIL-HYDROCHLOROTHIAZIDE 20-12.5 MG PO TABS: 2.0000 | ORAL_TABLET | Freq: Every day | ORAL | 1 refills | Status: DC

## 2017-01-31 MED ORDER — GLIPIZIDE-METFORMIN HCL 2.5-500 MG PO TABS: ORAL_TABLET | ORAL | 1 refills | Status: DC

## 2017-01-31 MED FILL — AMLODIPINE BESYLATE 10 MG T: 10 | 30 days supply | Qty: 30 | Fill #0

## 2017-02-06 DIAGNOSIS — D126 Benign neoplasm of colon, unspecified: Secondary | ICD-10-CM

## 2017-02-06 HISTORY — DX: Benign neoplasm of colon, unspecified: D12.6

## 2017-02-20 ENCOUNTER — Ambulatory Visit (AMBULATORY_SURGERY_CENTER): Payer: Self-pay | Admitting: *Deleted

## 2017-02-20 VITALS — Ht 72.0 in | Wt 163.4 lb

## 2017-02-20 DIAGNOSIS — Z1211 Encounter for screening for malignant neoplasm of colon: Secondary | ICD-10-CM

## 2017-02-20 MED ORDER — NA SULFATE-K SULFATE-MG SULF 17.5-3.13-1.6 GM/177ML PO SOLN: 1.0000 [IU] | Freq: Once | ORAL | 0 refills | Status: AC

## 2017-02-20 NOTE — Progress Notes (Signed)
No egg or soy allergy known to patient  No issues with past sedation with any surgeries  or procedures, no intubation problems  No diet pills per patient No home 02 use per patient  No blood thinners per patient  Pt denies issues with constipation  No A fib or A flutter  EMMI video sent to pt's e mail  

## 2017-02-21 ENCOUNTER — Emergency Department (HOSPITAL_COMMUNITY): Payer: Medicare Other

## 2017-02-21 ENCOUNTER — Encounter (HOSPITAL_COMMUNITY): Payer: Self-pay | Admitting: Emergency Medicine

## 2017-02-21 ENCOUNTER — Observation Stay (HOSPITAL_COMMUNITY)
Admission: EM | Admit: 2017-02-21 | Discharge: 2017-02-22 | Disposition: A | Discharge status: Home / Self Care | Payer: Medicare Other | Attending: Internal Medicine | Admitting: Internal Medicine

## 2017-02-21 DIAGNOSIS — N401 Enlarged prostate with lower urinary tract symptoms: Secondary | ICD-10-CM

## 2017-02-21 DIAGNOSIS — W19XXXA Unspecified fall, initial encounter: Secondary | ICD-10-CM | POA: Diagnosis not present

## 2017-02-21 DIAGNOSIS — I10 Essential (primary) hypertension: Secondary | ICD-10-CM | POA: Diagnosis not present

## 2017-02-21 DIAGNOSIS — R55 Syncope and collapse: Secondary | ICD-10-CM | POA: Diagnosis not present

## 2017-02-21 DIAGNOSIS — D649 Anemia, unspecified: Secondary | ICD-10-CM | POA: Diagnosis present

## 2017-02-21 DIAGNOSIS — N138 Other obstructive and reflux uropathy: Secondary | ICD-10-CM

## 2017-02-21 DIAGNOSIS — E785 Hyperlipidemia, unspecified: Secondary | ICD-10-CM | POA: Diagnosis present

## 2017-02-21 DIAGNOSIS — Z79899 Other long term (current) drug therapy: Secondary | ICD-10-CM | POA: Insufficient documentation

## 2017-02-21 DIAGNOSIS — S0990XA Unspecified injury of head, initial encounter: Secondary | ICD-10-CM | POA: Diagnosis not present

## 2017-02-21 DIAGNOSIS — E113299 Type 2 diabetes mellitus with mild nonproliferative diabetic retinopathy without macular edema, unspecified eye: Secondary | ICD-10-CM

## 2017-02-21 DIAGNOSIS — Y939 Activity, unspecified: Secondary | ICD-10-CM | POA: Diagnosis not present

## 2017-02-21 DIAGNOSIS — Y999 Unspecified external cause status: Secondary | ICD-10-CM | POA: Insufficient documentation

## 2017-02-21 DIAGNOSIS — E11319 Type 2 diabetes mellitus with unspecified diabetic retinopathy without macular edema: Secondary | ICD-10-CM | POA: Insufficient documentation

## 2017-02-21 DIAGNOSIS — R61 Generalized hyperhidrosis: Secondary | ICD-10-CM | POA: Diagnosis not present

## 2017-02-21 DIAGNOSIS — Z87891 Personal history of nicotine dependence: Secondary | ICD-10-CM | POA: Diagnosis not present

## 2017-02-21 DIAGNOSIS — Z8673 Personal history of transient ischemic attack (TIA), and cerebral infarction without residual deficits: Secondary | ICD-10-CM | POA: Diagnosis not present

## 2017-02-21 DIAGNOSIS — Y929 Unspecified place or not applicable: Secondary | ICD-10-CM | POA: Diagnosis not present

## 2017-02-21 LAB — BASIC METABOLIC PANEL
Anion gap: 12 (ref 5–15)
BUN: 17 mg/dL (ref 6–20)
CO2: 23 mmol/L (ref 22–32)
Calcium: 9.1 mg/dL (ref 8.9–10.3)
Chloride: 105 mmol/L (ref 101–111)
Creatinine, Ser: 1.34 mg/dL — ABNORMAL HIGH (ref 0.61–1.24)
GFR calc Af Amer: >60 mL/min (ref >60)
GFR calc non Af Amer: 54 mL/min — ABNORMAL LOW (ref >60)
Glucose, Bld: 89 mg/dL (ref 65–99)
Potassium: 3.7 mmol/L (ref 3.5–5.1)
Sodium: 140 mmol/L (ref 135–145)

## 2017-02-21 LAB — CBC
HCT: 30.1 % — ABNORMAL LOW (ref 39.0–52.0)
Hemoglobin: 10.5 g/dL — ABNORMAL LOW (ref 13.0–17.0)
MCH: 29.8 pg (ref 26.0–34.0)
MCHC: 34.9 g/dL (ref 30.0–36.0)
MCV: 85.5 fL (ref 78.0–100.0)
Platelets: 319 10*3/uL (ref 150–400)
RBC: 3.52 MIL/uL — ABNORMAL LOW (ref 4.22–5.81)
RDW: 12.7 % (ref 11.5–15.5)
WBC: 9.4 10*3/uL (ref 4.0–10.5)

## 2017-02-21 LAB — URINALYSIS, ROUTINE W REFLEX MICROSCOPIC
Bilirubin Urine: NEGATIVE
Glucose, UA: NEGATIVE mg/dL
Ketones, ur: NEGATIVE mg/dL
Leukocytes, UA: NEGATIVE
Nitrite: NEGATIVE
Protein, ur: 100 mg/dL — AB
Specific Gravity, Urine: 1.015 (ref 1.005–1.030)
pH: 5 (ref 5.0–8.0)

## 2017-02-21 LAB — GLUCOSE, CAPILLARY
Glucose-Capillary: 108 mg/dL — ABNORMAL HIGH (ref 65–99)
Glucose-Capillary: 141 mg/dL — ABNORMAL HIGH (ref 65–99)

## 2017-02-21 LAB — CBG MONITORING, ED: Glucose-Capillary: 88 mg/dL (ref 65–99)

## 2017-02-21 LAB — CK: Total CK: 67 U/L (ref 49–397)

## 2017-02-21 MED ORDER — SODIUM CHLORIDE 0.9 % IV SOLN: 250.0000 mL | INTRAVENOUS | Status: DC | PRN

## 2017-02-21 MED ORDER — SODIUM CHLORIDE 0.9% FLUSH: 3.0000 mL | Freq: Two times a day (BID) | INTRAVENOUS | Status: DC

## 2017-02-21 MED ORDER — SODIUM CHLORIDE 0.9% FLUSH: 3.0000 mL | INTRAVENOUS | Status: DC | PRN

## 2017-02-21 MED ORDER — SODIUM CHLORIDE 0.9 % IV SOLN
INTRAVENOUS | Status: DC
  Administered 2017-02-21 – 2017-02-22 (×3): via INTRAVENOUS

## 2017-02-21 MED ORDER — AMLODIPINE BESYLATE 10 MG PO TABS
10.0000 mg | ORAL_TABLET | Freq: Every day | ORAL | Status: DC
  Administered 2017-02-22: 10 mg via ORAL
  Filled 2017-02-21: qty 1

## 2017-02-21 MED ORDER — SODIUM CHLORIDE 0.9 % IV BOLUS (SEPSIS)
1000.0000 mL | Freq: Once | INTRAVENOUS | Status: AC
  Administered 2017-02-21: 1000 mL via INTRAVENOUS

## 2017-02-21 MED ORDER — INSULIN ASPART 100 UNIT/ML ~~LOC~~ SOLN
0.0000 [IU] | Freq: Three times a day (TID) | SUBCUTANEOUS | Status: DC
  Administered 2017-02-22: 1 [IU] via SUBCUTANEOUS
  Administered 2017-02-22: 2 [IU] via SUBCUTANEOUS
  Administered 2017-02-22: 3 [IU] via SUBCUTANEOUS

## 2017-02-21 MED ORDER — ENOXAPARIN SODIUM 40 MG/0.4ML ~~LOC~~ SOLN
40.0000 mg | SUBCUTANEOUS | Status: DC
  Administered 2017-02-21: 40 mg via SUBCUTANEOUS
  Filled 2017-02-21: qty 0.4

## 2017-02-21 MED ORDER — ASPIRIN EC 81 MG PO TBEC
81.0000 mg | DELAYED_RELEASE_TABLET | Freq: Every day | ORAL | Status: DC
  Administered 2017-02-22: 81 mg via ORAL
  Filled 2017-02-21: qty 1

## 2017-02-21 MED ORDER — ATORVASTATIN CALCIUM 80 MG PO TABS
80.0000 mg | ORAL_TABLET | Freq: Every day | ORAL | Status: DC
  Administered 2017-02-21: 80 mg via ORAL
  Filled 2017-02-21: qty 1

## 2017-02-21 NOTE — Plan of Care (Signed)
Problem: Education: Goal: Knowledge of [facility] Education information/materials will improve Outcome: Progressing Discussed plan of care with patient and family, and they verbalized understanding.  Problem: Safety: Goal: Ability to remain free from injury will improve Outcome: Progressing Pt has been compliant with safety measures.  Call light and personal items within reach, spouse at bedside, bed alarm on.  Problem: Pain Managment: Goal: General experience of comfort will improve Outcome: Progressing Denies c/o pain or discomfort.  Problem: Physical Regulation: Goal: Will remain free from infection Outcome: Progressing No s/s of infection.  Problem: Activity: Goal: Risk for activity intolerance will decrease Outcome: Progressing Ambulates to bathroom with standby assist without difficulty.

## 2017-02-21 NOTE — ED Triage Notes (Signed)
Pt [place] bus stop via [facility] reporting witnessed syncope followed by fall.  EMS reports pt's granddaughter witnessed event, denies any trauma to head. Pt denies any pain at this time, denies CP, SOB,dizziness. No focal deficits noted.

## 2017-02-21 NOTE — Progress Notes (Deleted)
Date: 02/21/2017               Patient Name:  Dennis Macias MRN: [id number]  DOB: [birth year] Age / Sex: 66 y.o., male   PCP: Jule Ser, DO              Medical Service: Internal Medicine Teaching Service              Attending Physician: Dr. Oval Linsey, MD    First Contact: Alveta Heimlich, MS III Pager: [phone]  Second Contact: Dr. Thomasene Ripple Pager: [phone]  Third Contact Dr. Maryellen Pile Pager: [phone]       After Hours (After 5p/  First Contact Pager: [phone]  weekends / holidays): Second Contact Pager: [phone]   Chief Complaint: Syncopal event  History of Present Illness: Dennis Macias is a 66 year old man with a past medical history of type II DM, HTN, HLD, and stroke in 3/17 who presents to the ED after a syncopal event this morning. Dennis Macias described that he was waiting for a bus at the bus stop with his granddaughter when he passed out. He was leaning against a pole and then the next thing he knew, he was on the ground. He had gotten up a little bit earlier than normal this morning to help his granddaughter, and he had not had a chance to eat breakfast before walking to the bus stop. He reported that he took all of his medications as he normally does this morning. He remembers feeling mildly dizzy before passing out, but otherwise he felt normal. He did not experience any palpitations before the event. He stated that he did not know how long he was unconscious, but he knew that he hit his head. His head is mildly tender to the touch currently, but he denies any current headaches. While he was unconscious, he did not urinate or defecate on himself. When he came to, he noted that he was "spitting up" a bit of the secretions in his mouth, but he denied vomiting. He did not feel nauseated during this time. He did not feel confused or like he wasn't thinking straight after he woke up. His daughter, who was in the room at the time of the interview, relayed that she heard from the  patient's granddaughter that Dennis Macias did not appear to lose consciousness during the event, as his eyes remained open. His granddaughter told her that while standing against the pole, his body jerked and then he fell down. He did not have any abnormal movements while on the ground, but she noted that he was bringing up spit and it was appearing like foam on his lips. She did not relay to the daughter how long Dennis Macias was on the ground or unconscious. Dennis Macias denies shortness of breath, chest pain or palpitations. He reports that he has never experienced anything like this before.  Meds: No current facility-administered medications for this encounter.    Current Outpatient Prescriptions  Medication Sig Dispense Refill  . amLODipine (NORVASC) 10 MG tablet Take 1 tablet (10 mg total) by mouth daily. IM TEACHING SERVICE. Thanks. (Patient taking differently: Take 10 mg by mouth daily. ) 30 tablet 1  . aspirin 81 MG tablet Take 1 tablet (81 mg total) by mouth daily. Internal Medicine Program place on hold until patient requests to fill 90 tablet 3  . atorvastatin (LIPITOR) 80 MG tablet Take 1 tablet (80 mg total) by mouth at bedtime. IM program 30 tablet 0  .  glipiZIDE-metformin (METAGLIP) 2.5-500 MG tablet TAKE 2 TABLETS BY MOUTH TWICE DAILY BEFORE MEALS. IM program 120 tablet 1  . lisinopril-hydrochlorothiazide (PRINZIDE,ZESTORETIC) 20-12.5 MG tablet Take 2 tablets by mouth daily. IM program 60 tablet 1    Allergies: Allergies as of 02/21/2017  . (No Known Allergies)   Past Medical History:  Diagnosis Date  . Diabetes ([place])   . Hyperlipidemia   . Hypertension   . Stroke [facility])    Past Surgical History:  Procedure Laterality Date  . TEE WITHOUT CARDIOVERSION N/A 09/14/2015   Procedure: TRANSESOPHAGEAL ECHOCARDIOGRAM (TEE);  Surgeon: Larey Dresser, MD;  [facility] ENDOSCOPY;  Service: Cardiovascular;  Laterality: N/A;   Family History  Problem Relation Age of Onset  . Hypertension  Mother   . Hyperlipidemia Mother   . Hyperlipidemia Father   . Colon cancer Neg Hx   . Colon polyps Neg Hx   . Esophageal cancer Neg Hx   . Rectal cancer Neg Hx   . Stomach cancer Neg Hx    Social History   Social History  . Marital status: Married    Spouse name: N/A  . Number of children: N/A  . Years of education: N/A   Occupational History  . Not on file.   Social History Main Topics  . Smoking status: Former Research scientist (life sciences)  . Smokeless tobacco: Former Systems developer    Types: [place] date: 07/09/1978     Comment: quit 1 year ago  . Alcohol use 0.6 oz/week    1 Cans of beer per week     Comment: quit last march/2017  . Drug use: No  . Sexual activity: Not on file   Other Topics Concern  . Not on file   Social History Narrative  . No narrative on file    Review of Systems: A comprehensive review of systems was negative.  Physical Exam: Blood pressure (!) 155/84, pulse 78, temperature 98.1 F (36.7 C), temperature source Oral, resp. rate 13, height 6' (1.829 m), weight 73.9 kg (163 lb), SpO2 100 %. General appearance: alert, cooperative and no distress Lungs: clear to auscultation bilaterally Heart: regular rate and rhythm, S1, S2 normal, no murmur, click, rub or gallop Abdomen: soft, non-tender; bowel sounds normal; no masses,  no organomegaly Extremities: extremities normal, atraumatic, no cyanosis or edema Pulses: 2+ and symmetric  Lab results: BMP: Na+ 140, K+ 3.7, Cl- 105, CO2 23, glucose 89, BUN 17, Cr 1.34, Ca2+ 9.1 CBC: WBC 9.4, RBC 3.52, Hgb 10.5, HCT 30.1, MCV 85.5, MCH 29.8, MCHC 34.9, RDW 12.7, plts 319 UA: small Hgb (dipstick), 100 protein, rare bacteria, negative for bili, nitrite, ketones, leukocytes CBG (1139): 88 CK: 67  Imaging results:  Dg Chest 2 View  Result Date: 02/21/2017 CLINICAL DATA:  Syncopal episode EXAM: CHEST  2 VIEW COMPARISON:  None. FINDINGS: The heart size and mediastinal contours are within normal limits. Both lungs are clear. The  visualized skeletal structures are unremarkable. IMPRESSION: No active cardiopulmonary disease. Electronically Signed   By: Inez Catalina M.D.   On: 02/21/2017 11:59   Ct Head Wo Contrast  Result Date: 02/21/2017 CLINICAL DATA:  Syncope, hit left side of head. EXAM: CT HEAD WITHOUT CONTRAST TECHNIQUE: Contiguous axial images were obtained from the base of the skull through the vertex without intravenous contrast. COMPARISON:  09/12/2015 FINDINGS: Brain: No acute intracranial abnormality. Specifically, no hemorrhage, hydrocephalus, mass lesion, acute infarction, or significant intracranial injury. There is atrophy and chronic small vessel disease changes. Vascular:  No hyperdense vessel or unexpected calcification. Skull: No acute calvarial abnormality. Sinuses/Orbits: Visualized paranasal sinuses and mastoids clear. Orbital soft tissues unremarkable. Other: Soft tissue swelling over the left parietal region. IMPRESSION: No acute intracranial abnormality. Atrophy, chronic microvascular disease. Electronically Signed   By: Rolm Baptise M.D.   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 Marhefka is a 66 year old man with a past medical history of HTN, HLD, T2DM and history of stroke (3/17) who presents after a syncopal event this morning.  Syncopal event: Mr. [place]' loss of consciousness this morning may be due to one of several causes. First, he may have experienced loss of consciousness due to a seizure. Though he has never experienced anything like this before, the stroke he had in March of 2017 might have formed a nidus for a seizure. He did not appear to experience a post-ictal state as he denied  feeling confused after he woke up. Also, he did not urinate or defecate himself, which can be signs of seizures, and he did not exhibit any rhythmic jerking while on the ground. However, his granddaughter's report that he was foaming at the mouth and that his body jerked before he fell might indicate seizure-like activity. Also, his loss of consciousness may be due to an arrhythmia. Though he does not have any history of arrhythmia and did not have any physical exam findings indicating arrhythmia (murmurs or carotid bruits), syncope often results from cardiac causes. He has several risk factors for aortic stenosis (age, HTN, HLD, hx of stroke), but he did not display any head bobbing, bounding pulses or the crescendo-decrescendo murmur classic for this disease. His EKG showed a RBBB which was also present on his EKG from last March. We will place Mr. Penn on telemetry to monitor for any arrhythmias. Alternatively, he may have experienced vasovagal syncope. He mentioned that he had not eaten his breakfast that morning and that he was sweating when he awoke. He may have been dehydrated, and he may have lost consciousness as a result. Lastly, he has a history of type II DM (last A1c 5.7%) and is currently treated with maximum dose glipizide and metformin. Since he did not eat breakfast this morning and took his medications as usual, he may have become hypoglycemic this morning and passed out as a result. However, he reported that he did not eat anything between the event and when he got to the hospital, and his CBG on admission was 88. If he had been hypoglycemic enough to cause loss of consciousness, he would not have came to without first correcting his blood glucose to a more normal value. - monitor with telemetry - EEG - orthostatic vitals - continue to check CBGs  AKI: Mr. Chap did not appear dehydrated on exam, but his Cr was 1.34 (up from his baseline of about 1.1). This may be in the setting of lack of  PO intake this morning before syncopal episode. - continue to monitor BMPs - MIVF on KVO  T2DM: Last A1c was 5.7% (6/18). Does not check blood sugars at home. On maximum doses of glipizide and metformin (10 mg and 2000 mg per day). Glucose 88 on admission. -  continue to monitor blood sugars - SSI  HTN: Blood pressures have been well controlled in the hospital. 137-155/75-84. - continue home amlodipine 10 mg qd  Hx of stroke: - continue aspirin 81 mg - continue atorvastatin 80 mg  This is a Careers information officer Note.  The care of the patient was discussed with Dr. Berneice Gandy and the assessment and plan was formulated with their assistance.  Please see their note for official documentation of the patient encounter.   Signed: Wynona Meals, Medical Student 02/21/2017, 2:10 PM

## 2017-02-21 NOTE — ED Notes (Signed)
Swelling noted to L temporal region, pt denies pain, HA.

## 2017-02-21 NOTE — H&P (Signed)
Date: 02/21/2017               Patient Name:  Dennis Macias MRN: [id number]  DOB: 1951-05-17 Age / Sex: 66 y.o., male   PCP: Jule Ser, DO              Medical Service: Internal Medicine Teaching Service              Attending Physician: Dr. Oval Linsey, MD    First Contact: Alveta Heimlich, MS III Pager: [phone]  Second Contact: Dr. Thomasene Ripple Pager: [phone]  Third Contact Dr. Maryellen Pile Pager: [phone]       After Hours (After 5p/  First Contact Pager: [phone]  weekends / holidays): Second Contact Pager: [phone]   Chief Complaint: Syncopal event  History of Present Illness: Dennis Macias is a 66 year old man with a past medical history of type II DM, HTN, HLD, and stroke in 3/17 who presents to the ED after a syncopal event this morning. [name] described that he was waiting for a bus at the bus stop with his granddaughter when he passed out. He was leaning against a pole and then the next thing he knew, he was on the ground. He had gotten up a little bit earlier than normal this morning to help his granddaughter, and he had not had a chance to eat breakfast before walking to the bus stop. He reported that he took all of his medications as he normally does this morning. He remembers feeling mildly dizzy before passing out, but otherwise he felt normal. He did not experience any palpitations before the event. He stated that he did not know how long he was unconscious, but he knew that he hit his head. His head is mildly tender to the touch currently, but he denies any current headaches. While he was unconscious, he did not urinate or defecate on himself. When he came to, he noted that he was "spitting up" a bit of the secretions in his mouth, but he denied vomiting. He did not feel nauseated during this time. He did not feel confused or like he wasn't thinking straight after he woke up. His daughter, who was in the room at the time of the interview, relayed that she heard from the  patient's granddaughter that [name] did not appear to lose consciousness during the event, as his eyes remained open. His granddaughter told her that while standing against the pole, his body jerked and then he fell down. He did not have any abnormal movements while on the ground, but she noted that he was bringing up spit and it was appearing like foam on his lips. She did not relay to the daughter how long [name] was on the ground or unconscious. [name] denies shortness of breath, chest pain or palpitations. He reports that he has never experienced anything like this before.  Meds: No current facility-administered medications for this encounter.    Current Outpatient Prescriptions  Medication Sig Dispense Refill  . amLODipine (NORVASC) 10 MG tablet Take 1 tablet (10 mg total) by mouth daily. IM TEACHING SERVICE. Thanks. (Patient taking differently: Take 10 mg by mouth daily. ) 30 tablet 1  . aspirin 81 MG tablet Take 1 tablet (81 mg total) by mouth daily. Internal Medicine Program place on hold until patient requests to fill 90 tablet 3  . atorvastatin (LIPITOR) 80 MG tablet Take 1 tablet (80 mg total) by mouth at bedtime. IM program 30 tablet 0  .  glipiZIDE-metformin (METAGLIP) 2.5-500 MG tablet TAKE 2 TABLETS BY MOUTH TWICE DAILY BEFORE MEALS. IM program 120 tablet 1  . lisinopril-hydrochlorothiazide (PRINZIDE,ZESTORETIC) 20-12.5 MG tablet Take 2 tablets by mouth daily. IM program 60 tablet 1    Allergies: Allergies as of 02/21/2017  . (No Known Allergies)   Past Medical History:  Diagnosis Date  . Diabetes ([place])   . Hyperlipidemia   . Hypertension   . Stroke [facility])    Past Surgical History:  Procedure Laterality Date  . TEE WITHOUT CARDIOVERSION N/A 09/14/2015   Procedure: TRANSESOPHAGEAL ECHOCARDIOGRAM (TEE);  Surgeon: Larey Dresser, MD;  Location: [facility] ENDOSCOPY;  Service: Cardiovascular;  Laterality: N/A;   Family History  Problem Relation Age of Onset  . Hypertension  Mother   . Hyperlipidemia Mother   . Hyperlipidemia Father   . Colon cancer Neg Hx   . Colon polyps Neg Hx   . Esophageal cancer Neg Hx   . Rectal cancer Neg Hx   . Stomach cancer Neg Hx    Social History   Social History  . Marital status: Married    Spouse name: N/A  . Number of children: N/A  . Years of education: N/A   Occupational History  . Not on file.   Social History Main Topics  . Smoking status: Former Research scientist (life sciences)  . Smokeless tobacco: Former Systems developer    Types: [place] date: 07/09/1978     Comment: quit 1 year ago  . Alcohol use 0.6 oz/week    1 Cans of beer per week     Comment: quit last march/2017  . Drug use: No  . Sexual activity: Not on file   Other Topics Concern  . Not on file   Social History Narrative  . No narrative on file    Review of Systems: A comprehensive review of systems was negative.  Physical Exam: Blood pressure (!) 155/84, pulse 78, temperature 98.1 F (36.7 C), temperature source Oral, resp. rate 13, height 6' (1.829 m), weight 73.9 kg (163 lb), SpO2 100 %. General appearance: alert, cooperative and no distress Lungs: clear to auscultation bilaterally Heart: regular rate and rhythm, S1, S2 normal, no murmur, click, rub or gallop Abdomen: soft, non-tender; bowel sounds normal; no masses,  no organomegaly Extremities: extremities normal, atraumatic, no cyanosis or edema Pulses: 2+ and symmetric  Lab results: BMP: Na+ 140, K+ 3.7, Cl- 105, CO2 23, glucose 89, BUN 17, Cr 1.34, Ca2+ 9.1 CBC: WBC 9.4, RBC 3.52, Hgb 10.5, HCT 30.1, MCV 85.5, MCH 29.8, MCHC 34.9, RDW 12.7, plts 319 UA: small Hgb (dipstick), 100 protein, rare bacteria, negative for bili, nitrite, ketones, leukocytes CBG (1139): 88 CK: 67  Imaging results:  Dg Chest 2 View  Result Date: 02/21/2017 CLINICAL DATA:  Syncopal episode EXAM: CHEST  2 VIEW COMPARISON:  None. FINDINGS: The heart size and mediastinal contours are within normal limits. Both lungs are clear. The  visualized skeletal structures are unremarkable. IMPRESSION: No active cardiopulmonary disease. Electronically Signed   By: Inez Catalina M.D.   On: 02/21/2017 11:59   Ct Head Wo Contrast  Result Date: 02/21/2017 CLINICAL DATA:  Syncope, hit left side of head. EXAM: CT HEAD WITHOUT CONTRAST TECHNIQUE: Contiguous axial images were obtained from the base of the skull through the vertex without intravenous contrast. COMPARISON:  09/12/2015 FINDINGS: Brain: No acute intracranial abnormality. Specifically, no hemorrhage, hydrocephalus, mass lesion, acute infarction, or significant intracranial injury. There is atrophy and chronic small vessel disease changes. Vascular:  No hyperdense vessel or unexpected calcification. Skull: No acute calvarial abnormality. Sinuses/Orbits: Visualized paranasal sinuses and mastoids clear. Orbital soft tissues unremarkable. Other: Soft tissue swelling over the left parietal region. IMPRESSION: No acute intracranial abnormality. Atrophy, chronic microvascular disease. Electronically Signed   By: Rolm Baptise M.D.   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 Deboer is a 66 year old man with a past medical history of HTN, HLD, T2DM and history of stroke (3/17) who presents after a syncopal event this morning.  Syncopal event: Mr. Lofton' loss of consciousness this morning may be due to one of several causes. First, he may have experienced loss of consciousness due to a seizure. Though he has never experienced anything like this before, the stroke he had in March of 2017 might have formed a nidus for a seizure. He did not appear to experience a post-ictal state as he denied  feeling confused after he woke up. Also, he did not urinate or defecate himself, which can be signs of seizures, and he did not exhibit any rhythmic jerking while on the ground. However, his granddaughter's report that he was foaming at the mouth and that his body jerked before he fell might indicate seizure-like activity. Also, his loss of consciousness may be due to an arrhythmia. Though he does not have any history of arrhythmia and did not have any physical exam findings indicating arrhythmia (murmurs or carotid bruits), syncope often results from cardiac causes. He has several risk factors for aortic stenosis (age, HTN, HLD, hx of stroke), but he did not display any head bobbing, bounding pulses or the crescendo-decrescendo murmur classic for this disease. His EKG showed a RBBB which was also present on his EKG from last March. We will place Mr. Remmers on telemetry to monitor for any arrhythmias. Alternatively, he may have experienced vasovagal syncope. He mentioned that he had not eaten his breakfast that morning and that he was sweating when he awoke. He may have been dehydrated, and he may have lost consciousness as a result. Lastly, he has a history of type II DM (last A1c 5.7%) and is currently treated with maximum dose glipizide and metformin. Since he did not eat breakfast this morning and took his medications as usual, he may have become hypoglycemic this morning and passed out as a result. However, he reported that he did not eat anything between the event and when he got to the hospital, and his CBG on admission was 88. If he had been hypoglycemic enough to cause loss of consciousness, he would not have came to without first correcting his blood glucose to a more normal value. - monitor with telemetry - EEG - orthostatic vitals - continue to check CBGs  AKI: Mr. Shankles did not appear dehydrated on exam, but his Cr was 1.34 (up from his baseline of about 1.1). This may be in the setting of lack of  PO intake this morning before syncopal episode. - continue to monitor BMPs - MIVF on KVO  T2DM: Last A1c was 5.7% (6/18). Does not check blood sugars at home. On maximum doses of glipizide and metformin (10 mg and 2000 mg per day). Glucose 88 on admission. -  continue to monitor blood sugars - SSI  HTN: Blood pressures have been well controlled in the hospital. 137-155/75-84. - continue home amlodipine 10 mg qd  Hx of stroke: - continue aspirin 81 mg - continue atorvastatin 80 mg  This is a Careers information officer Note.  The care of the patient was discussed with Dr. Berneice Gandy and the assessment and plan was formulated with their assistance.  Please see their note for official documentation of the patient encounter.   Signed: Wynona Meals, Medical Student 02/21/2017, 2:10 PM

## 2017-02-21 NOTE — ED Notes (Signed)
cbg was 88

## 2017-02-21 NOTE — ED Notes (Signed)
ED Provider at bedside. 

## 2017-02-21 NOTE — H&P (Signed)
Date: 02/21/2017               Patient Name:  Dennis Macias MRN: [id number]  DOB: May 30, 1951 Age / Sex: 66 y.o., male   PCP: Jule Ser, DO         Medical Service: Internal Medicine Teaching Service         Attending Physician: Dr. Oval Linsey, MD    First Contact: Will Parker Pager: [phone]  Second Contact: Third Contact: Dr. Thomasene Ripple Dr. Maryellen Pile Pager: Pager: [phone]       After Hours (After 5p/  First Contact Pager: [phone]  weekends / holidays): Second Contact Pager: [phone]   Chief Complaint: Loss of consciousness  History of Present Illness: [name] is a 66 yo with a PMH HTN, T2DM, and history of CVA in 09/2015 who presented to the ED after loss of consciousness. The patient reports that he woke up early today to walk with his granddaughter to the bus stop, so that he could help her get to her first day of work. While at the bus stop the patient became dizzy and leaned against a pole. He denied palpitations and diaphoresis before the fall. He then "blacked out" and next remembered waking up on the ground. When he woke up he remembered being covered in sweat.   His granddaughter, who was with him at the bus stop, stated that she saw him fall and hit his head. She noticed some fluid coming out of his mouth during this time, which the patient also remembers. His granddaughter did not notice any shaking of his extremities while on the ground. The patient did not report loss of bowel or bladder function with the episode. He doesn't remember falling to the ground but thinks he stayed awake the entire time as his granddaughter told him he did not close his eyes during the event. He did not have a period of confusion after the event. Patient denies current chest pain, shortness of breath, abdominal pain, diaphoresis and nausea/vomiting. He thinks he is more or less back to baseline since returning to hospital.   Meds:  Current Meds  Medication  Sig  . amLODipine (NORVASC) 10 MG tablet Take 1 tablet (10 mg total) by mouth daily. IM TEACHING SERVICE. Thanks. (Patient taking differently: Take 10 mg by mouth daily. )  . aspirin 81 MG tablet Take 1 tablet (81 mg total) by mouth daily. Internal Medicine Program place on hold until patient requests to fill  . atorvastatin (LIPITOR) 80 MG tablet Take 1 tablet (80 mg total) by mouth at bedtime. IM program  . glipiZIDE-metformin (METAGLIP) 2.5-500 MG tablet TAKE 2 TABLETS BY MOUTH TWICE DAILY BEFORE MEALS. IM program  . lisinopril-hydrochlorothiazide (PRINZIDE,ZESTORETIC) 20-12.5 MG tablet Take 2 tablets by mouth daily. IM program   Allergies: Allergies as of 02/21/2017  . (No Known Allergies)   Past Medical History: Past Medical History:  Diagnosis Date  . Diabetes ([place])   . Hyperlipidemia   . Hypertension   . Stroke [facility])    Past Surgical History: Past Surgical History:  Procedure Laterality Date  . TEE WITHOUT CARDIOVERSION N/A 09/14/2015   Procedure: TRANSESOPHAGEAL ECHOCARDIOGRAM (TEE);  Surgeon: Larey Dresser, MD;  Location: [facility] ENDOSCOPY;  Service: Cardiovascular;  Laterality: N/A;   Family History:  Family History  Problem Relation Age of Onset  . Hypertension Mother   . Hyperlipidemia Mother   . Hyperlipidemia Father   . Colon cancer Neg Hx   .  Colon polyps Neg Hx   . Esophageal cancer Neg Hx   . Rectal cancer Neg Hx   . Stomach cancer Neg Hx    Social History:  Patient lives with girlfriend and has many daughters/granddaughters close in the area. He does not smoke cigarettes and quit drinking after his stroke in March of last year. Patient denies illicit drug use.  Review of Systems: A complete ROS was negative except as per HPI.  Physical Exam: Blood pressure (!) 155/84, pulse 78, temperature 98.1 F (36.7 C), temperature source Oral, resp. rate 13, height 6' (1.829 m), weight 163 lb (73.9 kg), SpO2 100 %.  Physical Exam  Constitutional: He appears  well-developed and well-nourished.  Patient laying comfortably in bed in no acute distress.   HENT:  Small, well circumscribed 1.5 cm circular area of swelling and tenderness on the left temporal region. No overlying bruising, erythema, or skin ulceration.   Eyes: Pupils are equal, round, and reactive to light. EOM are normal.  Cardiovascular: Normal rate, regular rhythm, normal heart sounds and intact distal pulses.   No murmur heard. Pulmonary/Chest: Effort normal and breath sounds normal. No respiratory distress. He has no wheezes.  Abdominal: Soft. Bowel sounds are normal. He exhibits no distension. There is no tenderness.  Musculoskeletal: He exhibits no edema (of bilateral lower extremities) or tenderness (of bilateral lower extremities).  Skin: Skin is warm and dry. No rash noted. No erythema.   EKG: personally reviewed my interpretation is regular rate, sinus rhythm, right bundle branch block (seen also on EKG from 09/14/2015).   CXR: personally reviewed my interpretation is no infiltrates or consolidations suggestive of edema or pneumonia.  Assessment & Plan by Problem: Active Problems:   Syncope  [name] is a 66 yo with a PMH of HTN, T2DM, and CVA who presented to the ED after an acute loss of consciousness. The patient lost consciousness for a few seconds and the patient does not remember this event. The patient's head CT was negative for acute infarct, but his physical exam was consistent with hematoma.He was admitted to the internal medicine teaching service for evaluation and the specific problems addressed during admission are as follows:  Loss of consciousness:  The differential for this problem includes hypoglycemia, seizure, dehydration/orthostasis, and possible cardiac arrhythmia. The most concerning of these diagnoses is seizure. This is consistent with the patient's amnesia around the event and prior history of CVA, which maybe represent a seizure focus. This diagnosis  is not consistent with the patient's description of the event, as he did not report biting his tongue and loss of bowel/bladder function, and not consistent with what witnesses of the events saw. The patient did endorse dizziness prior to his loss of consciousness and diaphoresis upon awakening, which is consistent with hypoglycemia. The symptoms, however, resolved without intervention and this is not consistent with symptomatic hypoglycemia. The patient has an elevated creatinine, which was found to be 1.34 on admission, elevated from his historical baseline around 1.1. This may be consistent with dehydration leading to orthostasis. The patient endorsed dizziness but denied palpitations. Given the dizziness, cardiac arrhythmia is worth considering. This  may be making this diagnosis less likely but still worth consideration.  -EEG to rule out seizure -CK = 67 -Orthostatic vital signs -Continuous cardiac monitoring -UA on admission negative for nitrites, LE, rare bacteria, 0-5 squamous cells observed.  AKI: The patient's Cr on admission was 1.34, which is elevated above his historical baseline around 1.1. The patient may  be dehydrated 2/2 decreased PO intake (as he reports he has not ate or drank anything all day). Will give maintenance fluids and continue to monitor. -Maintenance fluids NS @ 125 ml/hr -BMP in AM  Hx of Diabetes: Last A1C = 5.7% on 12/20/2016. He was diagnosed with DM after his stroke on 09/2015 with an A1C of 10.7%. The patient currently takes glipizide-metformin 11-998 mg BID. His BG on arrival was 88. As stated above, his presentation is concerning for hypoglycemia given that he did not eat with his medication this AM, his last A1C is <6%, and he is currently taking a maximum dose of sulfonylurea. Will continue to monitor. -Hold glipizide-metformin while inpatient -CBG monitoring TID  Hx of HTN and CVA: BP within normal limits at last clinic visit per chart review. Patient  hypertensive in ED but has not taken all of his medications today. Will provide home regimen and continue to monitor.  -Continue home amlodipine 10 mg daily, lisinopril/HCTZ 20-12.5 mg daily -Continue home aspirin 81 mg and atorvastatin 80 mg  FEN/GI: -Carb-modified diet  VTE prophylaxis: -Lovenox  Dispo: Admit patient to Observation with expected length of stay less than 2 midnights.  SignedThomasene Ripple, MD 02/21/2017, 2:02 PM  Pager: [phone]

## 2017-02-21 NOTE — ED Provider Notes (Signed)
[place] DEPT Provider Note   CSN: [id number] Arrival date & time: 02/21/17  1011     History   Chief Complaint Chief Complaint  Patient presents with  . Fall  . Loss of Consciousness    HPI Dennis Macias is a 66 y.o. male.   Was with granddaughter. There was a witnessed syncope followed by a fall. Patient did hit his head. Patient currently denies any chest pain shortness of breath or dizziness or any focal deficits. Denies of any headache. Patient has no recollection of falling. Patient is followed by outpatient internal medicine clinic here at [facility]. Patient's had a past history of a CVA. Patient denies any neck pain.      Past Medical History:  Diagnosis Date  . Diabetes ([place])   . Hyperlipidemia   . Hypertension   . Stroke [facility])     Patient Active Problem List   Diagnosis Date Noted  . Loss of consciousness ([place]) 02/21/2017  . Anemia 12/21/2016  . Diabetic retinopathy ([place]) 06/07/2016  . 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 Onychomycosis of multiple toenails with type 2 diabetes mellitus ([place]) 03/22/2016  . Healthcare maintenance 12/22/2015  . History of CVA (cerebrovascular accident) 09/13/2015  . Hyperlipidemia 09/13/2015  . Diabetes mellitus with retinopathy of both eyes ([place])   . Hypertension     Past Surgical History:  Procedure Laterality Date  . TEE WITHOUT CARDIOVERSION N/A 09/14/2015   Procedure: TRANSESOPHAGEAL ECHOCARDIOGRAM (TEE);  Surgeon: Larey Dresser, MD;  Location: [facility];  Service: Cardiovascular;  Laterality: N/A;       Home Medications    Prior to Admission medications   Medication Sig Start Date End Date Taking? Authorizing Provider  amLODipine (NORVASC) 10 MG tablet Take 1 tablet (10 mg total) by mouth daily. IM TEACHING SERVICE. Thanks. Patient taking differently: Take 10 mg by mouth daily.  01/31/17  Yes Jule Ser, DO  aspirin 81 MG tablet Take 1 tablet (81 mg total) by mouth daily.  Internal Medicine Program place on hold until patient requests to fill 01/27/16  Yes Jule Ser, DO  atorvastatin (LIPITOR) 80 MG tablet Take 1 tablet (80 mg total) by mouth at bedtime. IM program 01/30/17  Yes Jule Ser, DO  glipiZIDE-metformin (METAGLIP) 2.5-500 MG tablet TAKE 2 TABLETS BY MOUTH TWICE DAILY BEFORE MEALS. IM program 01/31/17  Yes Jule Ser, DO  lisinopril-hydrochlorothiazide (PRINZIDE,ZESTORETIC) 20-12.5 MG tablet Take 2 tablets by mouth daily. IM program 01/31/17 01/31/18 Yes Jule Ser, DO    Family History Family History  Problem Relation Age of Onset  . Hypertension Mother   . Hyperlipidemia Mother   . Hyperlipidemia Father   . Colon cancer Neg Hx   . Colon polyps Neg Hx   . Esophageal cancer Neg Hx   . Rectal cancer Neg Hx   . Stomach cancer Neg Hx     Social History Social History  Substance Use Topics  . Smoking status: Former Research scientist (life sciences)  . Smokeless tobacco: Former Systems developer    Types: [place] date: 07/09/1978     Comment: quit 1 year ago  . Alcohol use 0.6 oz/week    1 Cans of beer per week     Comment: quit last march/2017     Allergies   Patient has no known allergies.   Review of Systems Review of Systems  Constitutional: Negative for fever.  HENT: Negative for congestion.   Eyes: Negative for redness.  Respiratory: Negative for shortness of  breath.   Cardiovascular: Negative for chest pain.  Gastrointestinal: Negative for abdominal pain.  Genitourinary: Negative for dysuria.  Musculoskeletal: Negative for back pain and neck pain.  Skin: Negative for rash and wound.  Neurological: Positive for syncope. Negative for seizures and headaches.  Hematological: Does not bruise/bleed easily.  Psychiatric/Behavioral: Negative for confusion.     Physical Exam Updated Vital Signs BP (!) 155/84   Pulse 78   Temp 98.1 F (36.7 C) (Oral)   Resp 13   Ht 1.829 m (6')   Wt 73.9 kg (163 lb)   SpO2 100%   BMI 22.11 kg/m    Physical Exam  Constitutional: He is oriented to person, place, and time. He appears well-developed and well-nourished. No distress.  HENT:  Head: Normocephalic.  Mouth/Throat: Oropharynx is clear and moist.  A 3 cm swelling to the left parietal area. No bleeding.  Eyes: Pupils are equal, round, and reactive to light. Conjunctivae and EOM are normal.  Neck: Normal range of motion. Neck supple.  Patient without any posterior neck tenderness to palpation. Patient good range of motion of the neck.  Cardiovascular: Normal rate, regular rhythm and normal heart sounds.   Pulmonary/Chest: Effort normal and breath sounds normal.  Abdominal: Soft. Bowel sounds are normal.  Musculoskeletal: Normal range of motion.  Neurological: He is alert and oriented to person, place, and time. No cranial nerve deficit or sensory deficit. He exhibits normal muscle tone. Coordination normal.  Skin: Skin is warm.  Nursing note and vitals reviewed.    ED Treatments / Results  Labs (all labs ordered are listed, but only abnormal results are displayed) Labs Reviewed  BASIC METABOLIC PANEL - Abnormal; Notable for the following:       Result Value   Creatinine, Ser 1.34 (*)    GFR calc non Af Amer 54 (*)    All other components within normal limits  CBC - Abnormal; Notable for the following:    RBC 3.52 (*)    Hemoglobin 10.5 (*)    HCT 30.1 (*)    All other components within normal limits  URINALYSIS, ROUTINE W REFLEX MICROSCOPIC - Abnormal; Notable for the following:    Hgb urine dipstick SMALL (*)    Protein, ur 100 (*)    Bacteria, UA RARE (*)    Squamous Epithelial / LPF 0-5 (*)    All other components within normal limits  CK  CBG MONITORING, ED    EKG  EKG Interpretation  Date/Time:  Thursday February 21 2017 10:15:29 EDT Ventricular Rate:  76 PR Interval:    QRS Duration: 157 QT Interval:  417 QTC Calculation: 469 R Axis:   -80 Text Interpretation:  Sinus rhythm RBBB and LAFB No  significant change since last tracing Confirmed by Fredia Sorrow [phone]) on 02/21/2017 10:57:27 AM       Radiology Dg Chest 2 View  Result Date: 02/21/2017 CLINICAL DATA:  Syncopal episode EXAM: CHEST  2 VIEW COMPARISON:  None. FINDINGS: The heart size and mediastinal contours are within normal limits. Both lungs are clear. The visualized skeletal structures are unremarkable. IMPRESSION: No active cardiopulmonary disease. Electronically Signed   By: Inez Catalina M.D.   On: 02/21/2017 11:59   Ct Head Wo Contrast  Result Date: 02/21/2017 CLINICAL DATA:  Syncope, hit left side of head. EXAM: CT HEAD WITHOUT CONTRAST TECHNIQUE: Contiguous axial images were obtained from the base of the skull through the vertex without intravenous contrast. COMPARISON:  09/12/2015 FINDINGS: Brain: No acute  intracranial abnormality. Specifically, no hemorrhage, hydrocephalus, mass lesion, acute infarction, or significant intracranial injury. There is atrophy and chronic small vessel disease changes. Vascular: No hyperdense vessel or unexpected calcification. Skull: No acute calvarial abnormality. Sinuses/Orbits: Visualized paranasal sinuses and mastoids clear. Orbital soft tissues unremarkable. Other: Soft tissue swelling over the left parietal region. IMPRESSION: No acute intracranial abnormality. Atrophy, chronic microvascular disease. Electronically Signed   By: Rolm Baptise M.D.   On: 02/21/2017 12:26    Procedures Procedures (including critical care time)  Medications Ordered in ED Medications - No data to display   Initial Impression / Assessment and Plan / ED Course  I have reviewed the triage vital signs and the nursing notes.  Pertinent labs & imaging results that were available during my care of the patient were reviewed by me and considered in my medical decision making (see chart for details).      Patient followed by internal medicine residency. Patient will require admission for the syncopal  episode and cardiac monitoring. Workup for the minor head injury to the left side of the head with swelling without any acute findings. Patient's labs here without significant abnormality. Patient is remained alert here. Patient has no memory of what occurred. Apparently patient was leaning against a pole pexing the newly fell. Patient does remember waking up on the ground kind of gagging. But there was no vomiting. No clear history of whether there was a seizure or not.  Final Clinical Impressions(s) / ED Diagnoses   Final diagnoses:  Syncope and collapse  Injury of head, initial encounter    New Prescriptions New Prescriptions   No medications on file     Fredia Sorrow, MD 02/21/17 1717

## 2017-02-22 ENCOUNTER — Ambulatory Visit (HOSPITAL_COMMUNITY): Payer: Medicare Other

## 2017-02-22 DIAGNOSIS — E113299 Type 2 diabetes mellitus with mild nonproliferative diabetic retinopathy without macular edema, unspecified eye: Secondary | ICD-10-CM

## 2017-02-22 DIAGNOSIS — N401 Enlarged prostate with lower urinary tract symptoms: Secondary | ICD-10-CM

## 2017-02-22 DIAGNOSIS — I10 Essential (primary) hypertension: Secondary | ICD-10-CM

## 2017-02-22 DIAGNOSIS — D649 Anemia, unspecified: Secondary | ICD-10-CM

## 2017-02-22 DIAGNOSIS — N138 Other obstructive and reflux uropathy: Secondary | ICD-10-CM

## 2017-02-22 DIAGNOSIS — R55 Syncope and collapse: Secondary | ICD-10-CM

## 2017-02-22 DIAGNOSIS — R338 Other retention of urine: Secondary | ICD-10-CM

## 2017-02-22 LAB — BASIC METABOLIC PANEL
Anion gap: 9 (ref 5–15)
BUN: 13 mg/dL (ref 6–20)
CO2: 26 mmol/L (ref 22–32)
Calcium: 8.9 mg/dL (ref 8.9–10.3)
Chloride: 104 mmol/L (ref 101–111)
Creatinine, Ser: 1.08 mg/dL (ref 0.61–1.24)
GFR calc Af Amer: >60 mL/min (ref >60)
GFR calc non Af Amer: >60 mL/min (ref >60)
Glucose, Bld: 128 mg/dL — ABNORMAL HIGH (ref 65–99)
Potassium: 3 mmol/L — ABNORMAL LOW (ref 3.5–5.1)
Sodium: 139 mmol/L (ref 135–145)

## 2017-02-22 LAB — GLUCOSE, CAPILLARY
Glucose-Capillary: 156 mg/dL — ABNORMAL HIGH (ref 65–99)
Glucose-Capillary: 149 mg/dL — ABNORMAL HIGH (ref 65–99)
Glucose-Capillary: 203 mg/dL — ABNORMAL HIGH (ref 65–99)

## 2017-02-22 LAB — MAGNESIUM: Magnesium: 1 mg/dL — ABNORMAL LOW (ref 1.7–2.4)

## 2017-02-22 MED ORDER — POTASSIUM CHLORIDE CRYS ER 20 MEQ PO TBCR
40.0000 meq | EXTENDED_RELEASE_TABLET | Freq: Once | ORAL | Status: AC
  Administered 2017-02-22: 40 meq via ORAL
  Filled 2017-02-22: qty 2

## 2017-02-22 MED ORDER — SODIUM CHLORIDE 0.9 % IV BOLUS (SEPSIS)
1000.0000 mL | Freq: Once | INTRAVENOUS | Status: AC
  Administered 2017-02-22: 1000 mL via INTRAVENOUS

## 2017-02-22 MED ORDER — METFORMIN HCL 1000 MG PO TABS: 1000.0000 mg | ORAL_TABLET | Freq: Two times a day (BID) | ORAL | 0 refills | Status: DC

## 2017-02-22 MED ORDER — LISINOPRIL 40 MG PO TABS: 40.0000 mg | ORAL_TABLET | Freq: Every day | ORAL | 0 refills | Status: DC

## 2017-02-22 MED ORDER — POTASSIUM CHLORIDE 10 MEQ/100ML IV SOLN
10.0000 meq | INTRAVENOUS | Status: AC
Start: 2017-02-22 — End: 2017-02-22
  Administered 2017-02-22 (×2): 10 meq via INTRAVENOUS
  Filled 2017-02-22 (×2): qty 100

## 2017-02-22 MED ORDER — MAGNESIUM SULFATE 2 GM/50ML IV SOLN
2.0000 g | Freq: Once | INTRAVENOUS | Status: AC
  Administered 2017-02-22: 2 g via INTRAVENOUS
  Filled 2017-02-22: qty 50

## 2017-02-22 MED FILL — LISINOPRIL 40 MG TAB: 40 | 30 days supply | Qty: 30 | Fill #0

## 2017-02-22 MED FILL — metFORMIN HCL 1000 MG TABS: 1000 | 30 days supply | Qty: 60 | Fill #0

## 2017-02-22 NOTE — Addendum Note (Signed)
Addended by: Forde Dandy on: 02/22/2017 03:59 PM   Modules accepted: Orders

## 2017-02-22 NOTE — Care Management Obs Status (Signed)
[facility] NOTIFICATION   Patient Details  Name: Dennis Macias MRN: [id number] Date of Birth: December 23, 1950   Medicare Observation Status Notification Given:  Yes    Bethena Roys, RN 02/22/2017, 3:42 PM

## 2017-02-22 NOTE — H&P (Signed)
Internal Medicine Attending Admission Note Date: 02/22/2017  Patient name: Dennis Macias Medical record number: [id number] Date of birth: Nov 07, 1950 Age: 66 y.o. Gender: male  I saw and evaluated the patient. I reviewed the resident's note and I agree with the resident's findings and plan as documented in the resident's note.  Chief Complaint(s): Syncope.  History - key components related to admission:  Dennis Macias is a 66 year old man with a history of hypertension, diabetes, and TIA in March 2017 who was in his usual state of health until the morning of admission when he presented with an episode of syncope. He accompanied his granddaughter to the bus stop and while waiting, became dizzy and had to lean up against a pole. At that point he had a syncopal event and the next thing he remembered was waking up on the ground. He denied any headaches, chest pain, palpitations, sweats, or shortness of breath immediately prior to the event. Of note, his granddaughter witnessed the event and noted him hitting his head on the ground. There were no seizure-like movements nor loss of bowel or bladder continence. He did not have any post syncopal confusion. By the time he arrived in the emergency department he felt he was back to his baseline. He also denies any recent illness, poor oral intake, nausea or vomiting, or diarrhea. He was therefore admitted to the internal medicine teaching service for further evaluation and care. Soon after admission he was noted to have orthostasis by blood pressure and pulse. He was aggressively volume resuscitated thereafter.  When seen on rounds the morning after admission he was feeling much improved. He denied any dizziness. His only new complaint was difficulty urinating. He apparently had a similar episode in March 2017 and has been followed by [facility], but the records are not available for our review at this time.  Physical Exam - key components related to  admission:  Vitals:   02/22/17 0431 02/22/17 0433 02/22/17 0436 02/22/17 1026  BP:    (!) 167/91  Pulse:      Resp:      Temp:      TempSrc:      SpO2: 99% 100% 100%   Weight:      Height:       Gen.: Well-developed, well-nourished, man lying comfortably in bed in no acute distress. Heart: Regular rate and rhythm without murmurs, rubs, or gallops.  Lab results:  Basic Metabolic Panel:  Recent Labs  02/21/17 1054 02/22/17 0447 02/22/17 1020  NA 140 139  --   K 3.7 3.0*  --   CL 105 104  --   CO2 23 26  --   GLUCOSE 89 128*  --   BUN 17 13  --   CREATININE 1.34* 1.08  --   CALCIUM 9.1 8.9  --   MG  --   --  1.0*   CBC:  Recent Labs  02/21/17 1054  WBC 9.4  HGB 10.5*  HCT 30.1*  MCV 85.5  PLT 319   Cardiac Enzymes:  Recent Labs  02/21/17 1429  CKTOTAL 67   CBG:  Recent Labs  02/21/17 1139 02/21/17 1749 02/21/17 2109 02/22/17 0729 02/22/17 1135  GLUCAP 88 108* 141* 156* 149*   Urinalysis:  Clear, yellow, specific gravity 1.015, pH 5.0, hemoglobin small, protein 100, nitrite negative, leukocytes negative, red blood cells 0-5 per high-power field, white blood cells 0-5 per high-power field.  Imaging results:   CT head without contrast: Personally reviewed. No acute  bleed.  PA and lateral chest x-ray: Personally reviewed. Without effusions, infiltrates, or masses.  Other results:  EKG: Personally reviewed. Normal sinus rhythm at 76 bpm, left axis deviation, right bundle branch block, left anterior hemiblock, no significant Q waves, no LVH by voltage, no ST segment changes, T wave inversions in a strain pattern in leads V1-V3, unchanged from the prior ECG on 09/13/2015.  Assessment & Plan by Problem:  Dennis Macias is a 66 year old man with a history of hypertension, diabetes, and TIA in March 2017 who was in his usual state of health until the morning of admission when he presented with an episode of syncope. He was found to be markedly orthostatic  and this is likely related to the hot summer weather in combination with the hydrochlorothiazide for hypertension, as he responded to IV hydration. An echocardiogram 1-1/2 years ago was without any left ventricular dysfunction and we have no reason to believe this is any different given we have an explanation for his syncopal event. The other issue is his urinary retention for which he is followed by [facility]. He has been on tamsulosin in the past, but this was recently discontinued. As the specifics of the urinary retention or not known to [place] this is better handled by his primary urologist.  1) Syncope: Secondary to orthostatic hypotension. He was encouraged to keep himself better hydrated during the summer months. We have also discontinued hydrochlorothiazide. He will follow-up with his primary care provider on the remaining hypertensive regimen to assess if additional medication is necessary. Although the alpha blockers can lead to orthostatic hypotension, if he is volume replete, and requires alpha blockade, he may be a candidate for reinstitution of tamsulosin which may have a moderate effect on the blood pressure as well.  2) Urinary retention: He is an established patient with [facility] and will be referred back to them for reassessment. At this time we are placing a Foley catheter given the urinary retention and this can be removed at his urologist's office or in the primary care provider's office if appropriate. [facility] has been called to schedule an outpatient follow-up visit.  3) Anemia: The initial anemia workup has been started in the outpatient setting. He does not have an iron deficiency and his iron panel is more consistent with anemia of chronic inflammation. It is not obvious to me what could cause this degree of anemia with regards to any underlying chronic disease processes. Diabetes has been associated with anemia of chronic disease but this seems to be too extreme  and 2 abrupt given the hemoglobin level a little over a year ago. In the outpatient setting and MMA could be obtained to assess the borderline vitamin B12 level. A red blood cell folate appears to have never been drawn and this could be obtained when the MMA is drawn. A reticulocyte count may also be helpful. There are no obvious marrow suppressive medications he is taking. If the initial evaluation is unremarkable, he may benefit from a hematology referral in the outpatient setting.  4) Diabetes: With a recent hemoglobin A1c of 5.7 his diabetic control seems to tight. We will therefore discharge him home on metformin alone and completely discontinue the sulfonylurea. A repeat hemoglobin A1c can be tested in 3 months to make sure he remains at a hemoglobin A1c target below 7.0.  4) Disposition: He is stable for discharge home today with follow-up with his primary care provider as well as with [facility].

## 2017-02-22 NOTE — Progress Notes (Signed)
Internal Medicine Attending  Date: 02/22/2017  Patient name: Dennis Macias Medical record number: [id number] Date of birth: 1950/09/09 Age: 66 y.o. Gender: male  I saw and evaluated the patient. I reviewed the resident's note by Dr. Berneice Gandy and I agree with the resident's findings and plans as documented in her progress note.  Please see my H&P dated 02/22/2017 for specifics of my evaluation, assessment, and plan from earlier the day.

## 2017-02-22 NOTE — Discharge Summary (Signed)
Name: Dennis Macias MRN: [id number] DOB: 1951-01-05 66 y.o. 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 ([place])   Hypertension   History of CVA (cerebrovascular accident)   Hyperlipidemia   Anemia   Syncope and collapse   Urinary retention   Type 2 diabetes mellitus with background retinopathy ([place])  Discharge Medications: Allergies as of 02/22/2017   No Known Allergies     Medication List    STOP taking these medications   glipiZIDE-metformin 2.5-500 MG tablet Commonly known as:  METAGLIP   lisinopril-hydrochlorothiazide 20-12.5 MG tablet Commonly known as:  PRINZIDE,ZESTORETIC     TAKE these medications   amLODipine 10 MG tablet Commonly known as:  NORVASC Take 1 tablet (10 mg total) by mouth daily. IM TEACHING SERVICE. Thanks. What changed:  additional instructions   aspirin 81 MG tablet Take 1 tablet (81 mg total) by mouth daily. Internal Medicine Program place on hold until patient requests to fill   atorvastatin 80 MG tablet Commonly known as:  LIPITOR Take 1 tablet (80 mg total) by mouth at bedtime. IM program   lisinopril 40 MG tablet Commonly known as:  PRINIVIL,ZESTRIL Take 1 tablet (40 mg total) by mouth daily.   metFORMIN 1000 MG tablet Commonly known as:  GLUCOPHAGE Take 1 tablet (1,000 mg total) by mouth 2 (two) times daily with a meal.      Disposition and follow-up:   [name] was discharged from [facility] in Good condition. At the hospital follow up visit please address:  1.  [name] was seen after an acute episode of syncope 2/2 dehydration and poor oral intake. The patient received IV fluids during hospitalization and his clinical status improved. Please assess his BP and symptoms of  orthostasis. The patient was discharged with instructions to stop taking HCTZ and only take lisinopril for BP management.  Overnight during hospitalization the patient developed acute urinary retention and required catheterization. He was sent home with a foley catheter in place and an appointment for urology for follow up as an outpatient.   2.  Labs / imaging needed at time of follow-up: BMP for electrolyte levels and CBC for chronic anemia. For outpatient workup of anemia, please obtain RBC folate level, MMA, and reticulocyte count. Can also consider referral to hematology for workup of chronic anemia.  3.  Pending labs/ test needing follow-up: None  Follow-up Appointments: Follow-up Information    Jule Ser, DO. Go to.   Specialty:  Internal Medicine Why:  Please follow up with the internal medicine clinic on Tuesday February 26, 2017 at 10:15 am. We have made an appointment for you so that one of the physicians (not your PCP) can evaluate your electrolytes and urination after discharge from hospital. Contact information: [place] [address] [phone]        [facility]. Go to.   Why:  Please follow up with the urologist Dr. Ronal Fear on Monday August 20th at 10:00am. 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 is a 66 yo with a PMH of HTN, T2DM, and CVA who presented to the ED after an acute loss of consciousness. The patient hit his head during the fall, but his head CT was negative for acute intracranial process. He was  admitted to the internal medicine teaching service for evaluation and the specific problems addressed during admission are as follows:  Loss of consciousness: The differential for this problem included hypoglycemia, seizure, dehydration/orthostasis, and cardiac arrhythmia. The patient was found to have positive orthostatics at least 2 times during this admission, suggesting that the patient's episode was likely 2/2 orthostasis. The patient's clinical status improved with 2L of NS bolus and maintenance fluids overnight. The patient was monitored by telemetry during hospitalization and showed no arrhythmias during this admission. We considered an EEG, however, the patient's improvement with IV hydration and positive orthostatics suggested that his problem was likely related to fluid status. It is possible that the patient has some autonomic dysfunction at baseline 2/2 poorly controlled diabetes in the past. This may have also contributed to his presentation. The patient was told to stop taking HCTZ, 25 mg daily for BP management. He will follow up with PCP on Tuesday August 21 for reevaluation.   Urinary retention: Patient states he has been having difficulty voiding since admission. The patient denies dysuria and states that he feels that he has to go, but has to strain a lot to get a little bit of urine out. Post void bladder scans showed 700 mL and 500 mL remaining after fluid boluses. Patient has received I&O cath x 2 for this problem. Per chart review the patient was seen in the ED for this problem in March 2018. He had to have a urinary catheter placed at that time, given a prescription for Flomax, and told to follow up with urology as an outpatient. A DRE during this admission did not reveal nodules concerning for prostate cancer. The patient was given a catheter upon discharge and made an appointment with his urologist at [facility] for Monday August 20 as an outpatient.   Hypertension: The  patient's home regimen includes amlodipine 10 mg daily and lisinopril-HCTZ 40-25 mg daily. The patient's lisinopril-HCTZ was held during admission 2/2 AKI and orthostatics. The patient was discharged with lisinopril 40 mg instead of the combination lisinopril-HCTZ.  Hx of Diabetes: The patient was diagnosed with DM after his stroke on 09/2015 with an A1C of 10.7%. His last A1C = 5.7% on 12/20/2016. He His BG on arrival was 88 and no hypoglycemia during admission. The patient was discharged on metformin 1000 mg BID instead of previous home regimen of glipizide-metformin 5-1000mg  BID, as he does not need a sulfonyurea for DM control any longer.   Chronic anemia: The patient's Hgb on admission was 10.5, near his last reported value of 10.9. The patient has been worked up as an outpatient for iron deficiency anemia (negative) and his studies suggest more anemia of chronic disease (elevated Ferritin). Please consider an MMA, RBC folate, and reticulocyte count as an outpatient. Would suggest follow up with a hematologist to address chronic anemia as outpatient.   AKI:The patient's Cr on admission was 1.34 and has returned to baseline of 1.08 with fluids.  FEN/GI: The patient had low Mg and K during admission. These were replaced while  inpatient.   Discharge Vitals:   BP (!) 167/91   Pulse 96   Temp 98.7 F (37.1 C) (Oral)   Resp 17   Ht 6' (1.829 m)   Wt 163 lb 8 oz (74.2 kg)   SpO2 100%   BMI 22.17 kg/m   Pertinent Labs, Studies, and Procedures:   BMP BMP Latest Ref Rng & Units 02/22/2017 02/21/2017 12/20/2016  Glucose 65 - 99 mg/dL 128(H) 89 84  BUN 6 - 20 mg/dL 13 17 17   Creatinine 0.61 - 1.24 mg/dL 1.08 1.34(H) 1.23  BUN/Creat Ratio 10 - 24 - - 14  Sodium 135 - 145 mmol/L 139 140 145(H)  Potassium 3.5 - 5.1 mmol/L 3.0(L) 3.7 3.6  Chloride 101 - 111 mmol/L 104 105 104  CO2 22 - 32 mmol/L 26 23 24   Calcium 8.9 - 10.3 mg/dL 8.9 9.1 9.6   CBC CBC Latest Ref Rng & Units 02/21/2017  12/20/2016 10/03/2016  WBC 4.0 - 10.5 K/uL 9.4 6.9 12.0(H)  Hemoglobin 13.0 - 17.0 g/dL 10.5(L) 10.9(L) 10.5(L)  Hematocrit 39.0 - 52.0 % 30.1(L) 30.7(L) 29.8(L)  Platelets 150 - 400 K/uL 319 362 304   Urinalysis    Component Value Date/Time   COLORURINE YELLOW 02/21/2017 [place] 02/21/2017 1138   LABSPEC 1.015 02/21/2017 1138   PHURINE 5.0 02/21/2017 1138   GLUCOSEU NEGATIVE 02/21/2017 1138   HGBUR SMALL (A) 02/21/2017 1138   BILIRUBINUR NEGATIVE 02/21/2017 1138   [place] 02/21/2017 1138   PROTEINUR 100 (A) 02/21/2017 1138   NITRITE NEGATIVE 02/21/2017 1138   LEUKOCYTESUR NEGATIVE 02/21/2017 1138   CK = 67 Mg = 1.0  CT head without contrast: FINDINGS: Brain: No acute intracranial abnormality. Specifically, no hemorrhage, hydrocephalus, mass lesion, acute infarction, or significant intracranial injury. There is atrophy and chronic small vessel disease changes.  Vascular: No hyperdense vessel or unexpected calcification.  Skull: No acute calvarial abnormality.  Sinuses/Orbits: Visualized paranasal sinuses and mastoids clear. Orbital soft tissues unremarkable.  Other: Soft tissue swelling over the left parietal region.  IMPRESSION: No acute intracranial abnormality.  Atrophy, chronic microvascular disease.  Discharge Instructions: Discharge Instructions    Call MD for:  difficulty breathing, headache or visual disturbances    Complete by:  As directed    Call MD for:  extreme fatigue    Complete by:  As directed    Call MD for:  persistant nausea and vomiting    Complete by:  As directed    Call MD for:  temperature >100.4    Complete by:  As directed    Diet - low sodium heart healthy    Complete by:  As directed    Increase activity slowly    Complete by:  As directed     Please follow up with your PCP and Urologist after discharge.  SignedThomasene Ripple, MD 02/22/2017, 3:31 PM   Pager: [phone]

## 2017-02-22 NOTE — Progress Notes (Signed)
Pt reports voiding frequent small amounts of urine and states he feels like his bladder isn't emptying completely.  Upon exam, bladder feels distended, but pt denies pain or discomfort.  Bladder scan shows greater than 700 mL.  MD notified.  Order received for In & Out cath.  Report given to [place], day shift RN.

## 2017-02-22 NOTE — Progress Notes (Signed)
Subjective:  [name] was seen laying comfortably in bed this morning. He states that his only complaint was of not being able to urinate this morning. He states that he has been urinating with the fluids we have given him all night. He also states that he has had liquid bowel movements when he goes to the bathroom. The patient endorsed difficulty with urination in the past. He denies having to strain when he urinates, a weak stream, and pain with urination.   Objective:  Vital signs in last 24 hours: Vitals:   02/22/17 0431 02/22/17 0433 02/22/17 0436 02/22/17 1026  BP:    (!) 167/91  Pulse:      Resp:      Temp:      TempSrc:      SpO2: 99% 100% 100%   Weight:      Height:       Physical Exam  Constitutional: He appears well-developed and well-nourished. No distress.  Cardiovascular: Normal rate, regular rhythm, normal heart sounds and intact distal pulses.   No murmur heard. Pulmonary/Chest: Effort normal and breath sounds normal. No respiratory distress. He has no wheezes.  Abdominal: Soft. He exhibits no distension. There is no tenderness. There is no guarding.  Musculoskeletal: He exhibits no edema (of bilateral lower extremities) or tenderness (of bilateral lower extremities).  Skin: Skin is warm and dry. Capillary refill takes less than 2 seconds. No rash noted. No erythema.   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 is a 66 yo with a PMH of HTN, T2DM, and CVA who presented to the ED after an acute loss of consciousness. He was admitted to the internal medicine teaching service for evaluation and the specific problems addressed during admission are as follows:  Loss of consciousness:  The differential for this problem included hypoglycemia, seizure,  dehydration/orthostasis, and cardiac arrhythmia. The patient was found to have positive orthostatics at least 2 times during this admission, suggesting that the patient's episode was likely 2/2 orthostasis. He is on 25 mg HCTZ at home for BP management and reports drinking only 16 oz of water per day, with no fluid intake throughout the day leading up to his admission. The patient's vital signs have improved with IV NS and will continue to monitor. Have discontinued workup for seizure and cardiac arrhythmia, as we have likely explanation for the patient's episode given his positive orthostasic vital signs and history of diuretic use with decreased PO intake.  -Patient has received 1L NS bolus x 2  -Maintenance fluids NS @ 125 ml/hr, encourage PO intake  Urinary retention: Patient states he has been having difficulty voiding since admission. The patient denies dysuria and states that he feels that he has to go, but has to strain a lot to get a little bit of urine out. Post void bladder scans showed 700 mL and 500 mL remaining after fluid boluses. Patient has received I&O cath x 2 for this problem. Per chart review the patient was seen in the ED for this problem in March 2018. He had to have a urinary catheter placed at that time, given a prescription for Flomax, and told to follow up with urology as an outpatient. It is unclear if he ever followed up with a urologist. Patient will likely need  foley catheter, Flomax, and close follow up with PCP (already scheduled appointment for Tuesday 02/26/17 in [facility]). -Will perform DRE   Hx of HTN and CVA: BP within normal limits at last clinic visit per chart review. Patient currently + orthostatics and holding home lisinopril-HCTZ 40-25 mg daily as a result.  -Continue amlodipine 10 mg -Plan to discontinue HCTZ on discharge 2/2 orthostasis; will prescribed 40 mg lisinopril and follow up as outpatient -Continue home aspirin 81 mg and atorvastatin 80 mg  Hx of  Diabetes: Last A1C = 5.7% on 12/20/2016. He was diagnosed with DM after his stroke on 09/2015 with an A1C of 10.7%. His BG on arrival was 88 and no hypoglycemia during admission thus far. -Hold glipizide-metformin while inpatient; plan to discharge on metformin 1000mg  BID.  -CBG monitoring TID  AKI: The patient's Cr on admission was 1.34 and has returned to baseline of 1.08 with fluids.  FEN/GI: -Carb-modified diet -Mg = 1.0; replaced with IV Mg on 8/17 -K = 3.0; replaced with IV and oral K on 8/17  VTE prophylaxis: -Lovenox  Dispo: Anticipated discharge in approximately 1-2 day(s).   Thomasene Ripple, MD 02/22/2017, 1:06 PM Pager: [phone]

## 2017-02-22 NOTE — Progress Notes (Signed)
Subjective: Dennis Macias did not sleep well last night as he got up to go the bathroom many times. He stated that he felt like he needed to urinate or defecate all night, and had several runny bowel movements overnight. However, even though he felt like he needed to urinate, he could not. He had an I and O cath this morning, and stated that he felt better immediately after that. This has happened to him before, but he doesn't usually experience this. Otherwise, he has no complaints. He did not feel dizzy sitting in bed or when he stood up for orthostatic vitals or walked to the bathroom. He has not had any further episodes of loss of consciousness. We discussed with him that he should be cleared to go home if his orthostatic vitals this afternoon are negative, and he was amenable to this plan.  Objective: Vital signs in last 24 hours: Vitals:   02/22/17 0427 02/22/17 0431 02/22/17 0433 02/22/17 0436  BP: 139/85     Pulse: 96     Resp: 17     Temp: 99.6 F (37.6 C)     TempSrc: Oral     SpO2: 100% 99% 100% 100%  Weight: 74.2 kg (163 lb 8 oz)     Height:       Weight change:   Intake/Output Summary (Last 24 hours) at 02/22/17 1021 Last data filed at 02/22/17 0852  Gross per 24 hour  Intake          1531.16 ml  Output             2725 ml  Net         -1193.84 ml   General appearance: alert, cooperative and no distress Lungs: clear to auscultation bilaterally Heart: regular rate and rhythm, S1, S2 normal, no murmur, click, rub or gallop Abdomen: soft, non-tender; bowel sounds normal; no masses,  no organomegaly Extremities: extremities normal, atraumatic, no cyanosis or edema  Lab Results: BMP: Na+ 139, K+ 3.0, Cl- 104, CO2 26, glucose 128, BUN 13, Cr 1.08, Ca2+ 8.9. CBG (0729): 156 Mg2+: pending  Micro Results: No results found for this or any previous visit (from the past 240 hour(s)).  Studies/Results: No new imaging studies  Medications: I have reviewed the patient's current  medications. Scheduled Meds: . amLODipine  10 mg Oral Daily  . aspirin EC  81 mg Oral Daily  . atorvastatin  80 mg Oral QHS  . enoxaparin (LOVENOX) injection  40 mg Subcutaneous Q24H  . insulin aspart  0-9 Units Subcutaneous TID WC  . potassium chloride  40 mEq Oral Once  . sodium chloride flush  3 mL Intravenous Q12H  . sodium chloride flush  3 mL Intravenous Q12H   Continuous Infusions: . sodium chloride    . sodium chloride 125 mL/hr at 02/22/17 0507  . 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 Cothron is a 66 year old man with a past medical history of HTN, HLD, T2DM and history of stroke (3/17) who presents after a syncopal event this morning.  Loss of consciousness: Due to the positive orthostatic vital signs on three separate occassions yesterdy afternoon, last night and this morning, Mr. Dessert' loss of consciousness yesterday morning now seems to be more  likely caused by dehydration. This is further evidenced by the AKI on presentation. He had positive orthostatic vitals (for systolic, diastolic and heart rate) around 5pm yesterday and was put on continuous IV NS at 125 mL/hr. Orthostatics taken around 9pm were also positive for systolic BP, but negative for diastolic and heart rate. He was then given a 1 L bolus of NS. Finally, orthostatics taken around 4am this morning were positive for systolic and diastolic, but not heart rate. He was given another liter bolus after this. This morning, he does not complain of any dizziness and does not appear to be dehydrated on exam. As a result, we canceled his EEG, and are less concerned about seizure as being a cause of his loss of consciousness. In the absence of any signs of cardiac  dysfunction, we also believe this to be less likely to be the cause. Finally, his blood sugars have been well controlled since admission. This does not exclude the potential for hypoglycemia to have caused his loss of consciousness. However, we will hold his metformin-glipizide combination pill while he is in the hospital and restart him on metformin only when he leaves. We are measuring orthostatic vitals this afternoon to evaluate his hydration status. If his orthostatic vitals are negative, he is amenable to discharge. - orthostatic vitals again this afternoon, discharge if negative - continue to monitor with telemetry - continue to check CBGs  Urinary retention: This morning, Mr. Gift complained to the nurse that he felt like his bladder wasn't emptying completely and that he was voiding frequent small amounts of urine. His bladder felt distended on exam and a bladder scan showed greater than 700 mL in the bladder. An in and out cath was ordered. Mr. Schnitzler reported feeling much better after the in and out cath. He told us this morning that he has had urinary retention in the past, but he usually does not have this problem. Last night he got up many times to use the bathroom because he felt the urge to both defecate and urinate, but he was only able to urinate a small amount. Per chart review, his urinary retention in the past may have been due to constipation. We will reevaluate after he urinates to check for any post-void residuals. If positive, we will place a foley and schedule a follow-up appointment with urology. - check post-void residual after he urinates spontaneously  AKI: Mr. Uher did not appear dehydrated on exam, but his Cr on admission was 1.34 (up from his baseline of about 1.1). This may be in the setting of lack of PO intake this morning before syncopal episode. Cr this morning was 1.08, so this seems to be resolving. - continue to monitor BMPs - continue fluids and encourage PO  intake  T2DM: Last A1c was 5.7% (6/18). Does not check blood sugars at home. On maximum doses of glipizide and metformin (10 mg and 2000 mg per day) at home. Glucose 88 on admission. CBGs WNL since admission. - continue to monitor blood sugars - SSI  HTN: Blood pressures have been mostly well controlled in the hospital. 137-157/75-84 other than one measurement of 167/91 at 1026 this morning. - continue home amlodipine 10 mg qd - holding home lisinopril-HCTZ - will discontinue home lisinopril-HCTZ on discharge and prescribe lisinopril 40 mg qd  Hx of stroke: - continue aspirin 81 mg - continue atorvastatin 80 mg  This is a Careers information officer Note.  The care of the patient was discussed with Dr. Berneice Gandy  and the assessment and plan formulated with their assistance.  Please see their attached note for official documentation of the daily encounter.   LOS: 0 days   Wynona Meals, Medical Student 02/22/2017, 10:21 AM

## 2017-02-22 NOTE — Progress Notes (Signed)
Patient received discharge information. Patient received  foley care education. Patient acknowledged understanding of information. Patient IV was removed.

## 2017-02-25 DIAGNOSIS — R338 Other retention of urine: Secondary | ICD-10-CM | POA: Diagnosis not present

## 2017-02-25 MED FILL — TAMSULOSIN HCL 0.4 MG CAP: 0.4 | 14 days supply | Qty: 14 | Fill #0

## 2017-02-26 ENCOUNTER — Encounter: Payer: Self-pay | Admitting: Internal Medicine

## 2017-02-26 ENCOUNTER — Telehealth: Payer: Self-pay | Admitting: Gastroenterology

## 2017-02-26 ENCOUNTER — Ambulatory Visit (INDEPENDENT_AMBULATORY_CARE_PROVIDER_SITE_OTHER): Payer: Medicare Other | Admitting: Internal Medicine

## 2017-02-26 VITALS — BP 136/65 | HR 88 | Temp 98.2°F | Ht 72.0 in | Wt 166.9 lb

## 2017-02-26 DIAGNOSIS — E113299 Type 2 diabetes mellitus with mild nonproliferative diabetic retinopathy without macular edema, unspecified eye: Secondary | ICD-10-CM

## 2017-02-26 DIAGNOSIS — Z7984 Long term (current) use of oral hypoglycemic drugs: Secondary | ICD-10-CM | POA: Diagnosis not present

## 2017-02-26 DIAGNOSIS — D649 Anemia, unspecified: Secondary | ICD-10-CM | POA: Diagnosis not present

## 2017-02-26 DIAGNOSIS — I10 Essential (primary) hypertension: Secondary | ICD-10-CM

## 2017-02-26 DIAGNOSIS — R55 Syncope and collapse: Secondary | ICD-10-CM | POA: Diagnosis not present

## 2017-02-26 DIAGNOSIS — R339 Retention of urine, unspecified: Secondary | ICD-10-CM

## 2017-02-26 NOTE — Patient Instructions (Addendum)
It was nice to meet you today, Dennis Macias.   Please make sure to drink plenty of fluids during the day. Try drinking a bottle of water with every meal. Avoid standing up suddenly. Give yourself a few seconds after standing up to avoid symptoms.  Continue taking metformin 1 tablet 2 times a day.   Continue taking amlodipine 1 tablet once a day and lisinopril 1 tablet once day.  I have put a referral to see a hematologist (blood doctor) for your low blood levels.  I will call you if results from your blood tests are abnormal.  Please follow-up with [place] in 4 weeks or sooner if you start to develop symptoms such as dizziness, lightheadedness, weakness or fatigue.

## 2017-02-26 NOTE — Telephone Encounter (Signed)
Suprep sample placed with 4th floor receptionist; pt called and made aware He is planning on picking it up today Angel/PV

## 2017-02-26 NOTE — Assessment & Plan Note (Addendum)
Patient previously on lisinopril-hydrochlorothiazide and amlodipine. Hydrochlorothiazide stopped during admission due to syncopal episode. Lisinopril 40 mg daily continued. BP today at goal 136/65.  - Continue lisinopril 40mg  QD and amlodipine 10mg  QD given that patient is currently asymptomatic  - Consider stopping amlodipine if patient develops symptoms of orthostatic hypotension

## 2017-02-26 NOTE — Assessment & Plan Note (Addendum)
Patient reports difficulty voiding during admission. He was discharged with a Foley catheter in place. He was instructed to follow-up with [facility] on 8/20 as an outpatient. Foley catheter in place today. Patient states urology decided to leave the Foley catheter in place for 2 more days. He has an appointment for tomorrow for removal of Foley catheter.  - Follow-up with urology tomorrow (8/22)

## 2017-02-26 NOTE — Progress Notes (Signed)
   CC: Hospital follow up after syncopal episode, urinary rentention, and chronic anemia   HPI:  [name] is a 66 y.o. male with PMH listed below who presents to clinic for hospital follow-up after syncopal episode, urinary rentention, and chronic anemia. Please see problem based assessment and plan for further details.    Past Medical History:  Diagnosis Date  . Diabetes ([place])   . Hyperlipidemia   . Hypertension   . Stroke [facility] [place])    Review of Systems:   Review of Systems  Constitutional: Negative for chills, fever and malaise/fatigue.  Cardiovascular: Negative for chest pain, palpitations and leg swelling.  Musculoskeletal: Negative for falls.  Neurological: Negative for dizziness, loss of consciousness and headaches.     Physical Exam:  Vitals:   02/26/17 0833  BP: 136/65  Pulse: 88  Temp: 98.2 F (36.8 C)  TempSrc: Oral  SpO2: 100%  Weight: 166 lb 14.4 oz (75.7 kg)  Height: 6' (1.829 m)   General: pleasant male, Well-nourished, well-developed, in no acute distress HENT: NCAT, neck supple and FROM, MMM, OP clear without exudates or erythema, nl/poor dentition  Cardiac: regular rate and rhythm, nl S1/S2, no murmurs, rubs or gallops  Pulm: CTAB, no wheezes or crackles, no increased work of breathing  Ext: warm and well perfused, no peripheral edema, normal skin turgor   Assessment & Plan:   See Encounters Tab for problem based charting.  Patient seen with Dr. Lynnae January

## 2017-02-26 NOTE — Assessment & Plan Note (Addendum)
Hemoglobin 10.5 during admission. Work up for iron deficiency anemia was negative. Elevated ferritin suggests anemia of chronic disease.  - Appointment scheduled with Dr. Beryle Beams on 9/17 - RBC folate, MMA, and retic count ordered

## 2017-02-26 NOTE — Assessment & Plan Note (Addendum)
Patient admitted on 8/17 after a syncopal episode secondary to dehydration and poor oral intake. Found to be orthostatic in the ED. He was treated with IV fluids with marked clinical improvement. No cardiac arrhythmias noted on telemetry. No EEG performed given improvement with IV fluids. Also possibility of autonomic dysfunction in the setting of poorly controlled diabetes in the past. Hydrochlorothiazide 25 stopped prior to discharge. He was continued on lisinopril and amlodipine.  Patient states he has been doing well since discharged. Denies dizziness, lightheadedness, and problems with balance, weakness, and fatigue. States he usually drinks 1 bottle of water and a small bottle of Gatorade per day. No changes in his appetite. Denies chest pain and shortness of breath.  Orthostatics today: 155/80  91 ->128/70  83 -> 106/66  101  - CBC and BMP - Patient educated on importance of fluid intake. Advised to avoid sudden changes in position. - Follow-up in 4 weeks   8/22: BMP nl, CBC with Hgb stable at 10.5.

## 2017-02-26 NOTE — Assessment & Plan Note (Addendum)
Patient admitted on 8/17 after a syncopal episode secondary to dehydration and poor oral intake. Found to be orthostatic in the ED. He was treated with IV fluids with marked clinical improvement. No cardiac arrhythmias noted on telemetry. No EEG performed given improvement with IV fluids. Also possibility of autonomic dysfunction in the setting of poorly controlled diabetes in the past. Hydrochlorothiazide 25 stopped prior to discharge. He was continued on lisinopril.  Patient states he has been doing well since discharged. Denies dizziness, lightheadedness, and problems with balance, weakness, and fatigue. States he usually drinks 1 bottle of water and a small bottle of Gatorade per day. No changes in his appetite. Denies chest pain and shortness of breath.  Orthostatics today: 155/80  91 ->128/70  83 -> 106/66  101

## 2017-02-27 DIAGNOSIS — R338 Other retention of urine: Secondary | ICD-10-CM | POA: Diagnosis not present

## 2017-02-28 LAB — CBC WITH DIFFERENTIAL/PLATELET
Basophils Absolute: 0 10*3/uL (ref 0.0–0.2)
Basos: 0 %
EOS (ABSOLUTE): 0 10*3/uL (ref 0.0–0.4)
Eos: 1 %
Hematocrit: 30.8 % — ABNORMAL LOW (ref 37.5–51.0)
Hemoglobin: 10.5 g/dL — ABNORMAL LOW (ref 13.0–17.7)
Immature Grans (Abs): 0 10*3/uL (ref 0.0–0.1)
Immature Granulocytes: 0 %
Lymphocytes Absolute: 1.9 10*3/uL (ref 0.7–3.1)
Lymphs: 23 %
MCH: 29.8 pg (ref 26.6–33.0)
MCHC: 34.1 g/dL (ref 31.5–35.7)
MCV: 88 fL (ref 79–97)
Monocytes Absolute: 0.4 10*3/uL (ref 0.1–0.9)
Monocytes: 5 %
Neutrophils Absolute: 6.2 10*3/uL (ref 1.4–7.0)
Neutrophils: 71 %
Platelets: 379 10*3/uL (ref 150–379)
RBC: 3.52 x10E6/uL — ABNORMAL LOW (ref 4.14–5.80)
RDW: 14.1 % (ref 12.3–15.4)
WBC: 8.6 10*3/uL (ref 3.4–10.8)

## 2017-02-28 LAB — RETICULOCYTES: Retic Ct Pct: 1.4 % (ref 0.6–2.6)

## 2017-02-28 LAB — FOLATE RBC
Folate, Hemolysate: 275.2 ng/mL
Folate, RBC: 894 ng/mL (ref >498)

## 2017-03-01 LAB — BMP8+ANION GAP
Anion Gap: 15 mmol/L (ref 10.0–18.0)
BUN/Creatinine Ratio: 9 — ABNORMAL LOW (ref 10–24)
BUN: 9 mg/dL (ref 8–27)
CO2: 22 mmol/L (ref 20–29)
Calcium: 9.3 mg/dL (ref 8.6–10.2)
Chloride: 102 mmol/L (ref 96–106)
Creatinine, Ser: 0.97 mg/dL (ref 0.76–1.27)
GFR calc Af Amer: 94 mL/min/{1.73_m2} (ref >59)
GFR calc non Af Amer: 82 mL/min/{1.73_m2} (ref >59)
Glucose: 181 mg/dL — ABNORMAL HIGH (ref 65–99)
Potassium: 3.9 mmol/L (ref 3.5–5.2)
Sodium: 139 mmol/L (ref 134–144)

## 2017-03-01 LAB — METHYLMALONIC ACID, SERUM: Methylmalonic Acid: 573 nmol/L — ABNORMAL HIGH (ref 0–378)

## 2017-03-01 NOTE — Progress Notes (Signed)
Internal Medicine Clinic Attending  I saw and evaluated the patient.  I personally confirmed the key portions of the history and exam documented by Dr. Frederico Hamman and I reviewed pertinent patient test results.  The assessment, diagnosis, and plan were formulated together and I agree with the documentation in the resident's note.

## 2017-03-04 NOTE — Addendum Note (Signed)
Addended by: Truddie Crumble on: 03/04/2017 03:53 PM   Modules accepted: Orders

## 2017-03-05 ENCOUNTER — Telehealth: Payer: Self-pay | Admitting: Gastroenterology

## 2017-03-05 NOTE — Telephone Encounter (Signed)
Returned patient's call and spoke with his girlfriend with his permission.  She was concerned about what medications he could take the day of his procedure.  Metformin is the only medication he has to hold the day of, all other medications he can take as long as it is before the cut off time.  She states that she understands and all questions were answered.

## 2017-03-06 ENCOUNTER — Ambulatory Visit (AMBULATORY_SURGERY_CENTER): Payer: Medicare Other | Admitting: Gastroenterology

## 2017-03-06 ENCOUNTER — Encounter: Payer: Self-pay | Admitting: Gastroenterology

## 2017-03-06 VITALS — BP 129/72 | HR 79 | Temp 98.4°F | Resp 13 | Ht 72.0 in | Wt 163.0 lb

## 2017-03-06 DIAGNOSIS — Z1211 Encounter for screening for malignant neoplasm of colon: Secondary | ICD-10-CM | POA: Diagnosis not present

## 2017-03-06 DIAGNOSIS — D128 Benign neoplasm of rectum: Secondary | ICD-10-CM | POA: Diagnosis not present

## 2017-03-06 DIAGNOSIS — D122 Benign neoplasm of ascending colon: Secondary | ICD-10-CM

## 2017-03-06 MED ORDER — SODIUM CHLORIDE 0.9 % IV SOLN: 500.0000 mL | INTRAVENOUS | Status: DC

## 2017-03-06 NOTE — Progress Notes (Signed)
A and O x3. Report to RN. Tolerated MAC anesthesia well.

## 2017-03-06 NOTE — Op Note (Signed)
[facility] Patient Name: Dennis Macias Procedure Date: 03/06/2017 9:34 AM MRN: [id number] Endoscopist: Ladene Artist , MD Age: 66 Referring MD:  Date of Birth: 1950-07-16 Gender: Male Account #: [id number] Procedure:                Colonoscopy Indications:              Screening for colorectal malignant neoplasm Medicines:                Monitored Anesthesia Care Procedure:                Pre-Anesthesia Assessment:                           - Prior to the procedure, a History and Physical                            was performed, and patient medications and                            allergies were reviewed. The patient's tolerance of                            previous anesthesia was also reviewed. The risks                            and benefits of the procedure and the sedation                            options and risks were discussed with the patient.                            All questions were answered, and informed consent                            was obtained. Prior Anticoagulants: The patient has                            taken no previous anticoagulant or antiplatelet                            agents. ASA Grade Assessment: II - A patient with                            mild systemic disease. After reviewing the risks                            and benefits, the patient was deemed in                            satisfactory condition to undergo the procedure.                           After obtaining informed consent, the colonoscope  was passed under direct vision. Throughout the                            procedure, the patient's blood pressure, pulse, and                            oxygen saturations were monitored continuously. The                            Colonoscope was introduced through the anus and                            advanced to the the cecum, identified by                            appendiceal orifice and  ileocecal valve. The                            ileocecal valve, appendiceal orifice, and rectum                            were photographed. The quality of the bowel                            preparation was adequate to identify polyps 6 mm                            and larger in size after extensive lavage and                            suctioning. The patient tolerated the procedure                            well. The colonoscopy was somewhat difficult due to                            a tortuous colon. Successful completion of the                            procedure was aided by using manual pressure,                            withdrawing and reinserting the scope,                            straightening and shortening the scope to obtain                            bowel loop reduction and using scope torsion. Scope In: 9:55:23 AM Scope Out: 10:20:16 AM Scope Withdrawal Time: 0 hours 18 minutes 53 seconds  Total Procedure Duration: 0 hours 24 minutes 53 seconds  Findings:                 The perianal and digital rectal examinations  were                            normal.                           Two sessile polyps were found in the ascending                            colon. The polyps were 6 to 7 mm in size. These                            polyps were removed with a cold snare. Resection                            and retrieval were complete.                           Two sessile polyps were found in the proximal                            rectum. The polyps were 10 to 16 mm in size. These                            polyps were removed piecemeal with a hot snare.                            Resection and retrieval were complete.                           Internal hemorrhoids were found during                            retroflexion. The hemorrhoids were small and Grade                            I (internal hemorrhoids that do not prolapse).                           The  exam was otherwise without abnormality on                            direct and retroflexion views. Complications:            No immediate complications. Estimated blood loss:                            None. Estimated Blood Loss:     Estimated blood loss: none. Impression:               - Two 6 to 7 mm polyps in the ascending colon,                            removed with a cold snare. Resected and retrieved.                           -  Two 10 to 16 mm polyps in the rectum, removed                            piecemeal with a hot snare. Resected and retrieved.                           - Internal hemorrhoids.                           - The examination was otherwise normal on direct                            and retroflexion views. Recommendation:           - Repeat colonoscopy in 1 year for surveillance                            after piecemeal polypectomy with a more extensive                            bowel prep.                           - Patient has a contact number available for                            emergencies. The signs and symptoms of potential                            delayed complications were discussed with the                            patient. Return to normal activities tomorrow.                            Written discharge instructions were provided to the                            patient.                           - Resume previous diet.                           - Continue present medications.                           - Await pathology results.                           - No aspirin, ibuprofen, naproxen, or other                            non-steroidal anti-inflammatory drugs for 2 weeks                            after  polyp removal. Ladene Artist, MD 03/06/2017 10:32:04 AM This report has been signed electronically.

## 2017-03-06 NOTE — Patient Instructions (Signed)
YOU HAD AN ENDOSCOPIC PROCEDURE TODAY AT [facility]:   Refer to the procedure report that was given to you for any specific questions about what was found during the examination.  If the procedure report does not answer your questions, please call your gastroenterologist to clarify.  If you requested that your care partner not be given the details of your procedure findings, then the procedure report has been included in a sealed envelope for you to review at your convenience later.  YOU SHOULD EXPECT: Some feelings of bloating in the abdomen. Passage of more gas than usual.  Walking can help get rid of the air that was put into your GI tract during the procedure and reduce the bloating. If you had a lower endoscopy (such as a colonoscopy or flexible sigmoidoscopy) you may notice spotting of blood in your stool or on the toilet paper. If you underwent a bowel prep for your procedure, you may not have a normal bowel movement for a few days.  Please Note:  You might notice some irritation and congestion in your nose or some drainage.  This is from the oxygen used during your procedure.  There is no need for concern and it should clear up in a day or so.  SYMPTOMS TO REPORT IMMEDIATELY:   Following lower endoscopy (colonoscopy or flexible sigmoidoscopy):  Excessive amounts of blood in the stool  Significant tenderness or worsening of abdominal pains  Swelling of the abdomen that is new, acute  Fever of 100F or higher  For urgent or emergent issues, a gastroenterologist can be reached at any hour by calling [phone].   DIET:  We do recommend a small meal at first, but then you may proceed to your regular diet.  Drink plenty of fluids but you should avoid alcoholic beverages for 24 hours.  ACTIVITY:  You should plan to take it easy for the rest of today and you should NOT DRIVE or use heavy machinery until tomorrow (because of the sedation medicines used during the test).     FOLLOW UP: Our staff will call the number listed on your records the next business day following your procedure to check on you and address any questions or concerns that you may have regarding the information given to you following your procedure. If we do not reach you, we will leave a message.  However, if you are feeling well and you are not experiencing any problems, there is no need to return our call.  We will assume that you have returned to your regular daily activities without incident.  If any biopsies were taken you will be contacted by phone or by letter within the next 1-3 weeks.  Please call us at [phone] if you have not heard about the biopsies in 3 weeks.   Repeat Colonoscopy in 1 year for surveillance after piecmeal polypectomy with a more extensive bowel prep. Await for biopsy results Polyps (handout given) .Marland KitchenNo ibuprofen, naproxen or other non-steroidal anti-inflammatory drugs for 2 weeks after polyp removal. Tylenol okay if needed.    SIGNATURES/CONFIDENTIALITY: You and/or your care partner have signed paperwork which will be entered into your electronic medical record.  These signatures attest to the fact that that the information above on your After Visit Summary has been reviewed and is understood.  Full responsibility of the confidentiality of this discharge information lies with you and/or your care-partner.

## 2017-03-06 NOTE — Progress Notes (Signed)
Called to room to assist during endoscopic procedure.  Patient ID and intended procedure confirmed with present staff. Received instructions for my participation in the procedure from the performing physician.  

## 2017-03-06 NOTE — Progress Notes (Signed)
Dr Fuller Plan notified of pt visit to ED with syncopal episode, secondary to dehydration. States that it is ok since he was evaluated and it was not Related to a cardiac issue.

## 2017-03-07 ENCOUNTER — Telehealth: Payer: Self-pay

## 2017-03-07 NOTE — Telephone Encounter (Signed)
Phone disconnected

## 2017-03-07 NOTE — Telephone Encounter (Signed)
  Follow up Call-  [phone]   Patient questions:  Left message

## 2017-03-12 ENCOUNTER — Encounter: Payer: Self-pay | Admitting: Gastroenterology

## 2017-03-12 MED FILL — AMLODIPINE BESYLATE 10 MG T: 10 | 30 days supply | Qty: 30 | Fill #1

## 2017-03-13 ENCOUNTER — Other Ambulatory Visit: Payer: Self-pay

## 2017-03-13 DIAGNOSIS — E785 Hyperlipidemia, unspecified: Secondary | ICD-10-CM

## 2017-03-13 DIAGNOSIS — R300 Dysuria: Secondary | ICD-10-CM | POA: Diagnosis not present

## 2017-03-13 DIAGNOSIS — R8279 Other abnormal findings on microbiological examination of urine: Secondary | ICD-10-CM | POA: Diagnosis not present

## 2017-03-13 MED ORDER — ATORVASTATIN CALCIUM 80 MG PO TABS: 80.0000 mg | ORAL_TABLET | Freq: Every day | ORAL | 3 refills | Status: DC

## 2017-03-13 MED FILL — TAMSULOSIN HCL 0.4 MG CAP: 0.4 | 30 days supply | Qty: 30 | Fill #0

## 2017-03-13 NOTE — Telephone Encounter (Signed)
atorvastatin (LIPITOR) 80 MG tablet, refill request.

## 2017-03-13 NOTE — Telephone Encounter (Signed)
Pt is calling back request refill on atorvastatin (LIPITOR) 80 MG tablet. Pt is out of med and the pharmacy is still waiting for the clinic to respond back.

## 2017-03-14 ENCOUNTER — Other Ambulatory Visit: Payer: Self-pay | Admitting: Internal Medicine

## 2017-03-14 DIAGNOSIS — E11319 Type 2 diabetes mellitus with unspecified diabetic retinopathy without macular edema: Secondary | ICD-10-CM

## 2017-03-14 MED FILL — ATORVASTATIN 80 MG TABLET: 80 | 30 days supply | Qty: 30 | Fill #0

## 2017-03-15 MED ORDER — LISINOPRIL 40 MG PO TABS: 40.0000 mg | ORAL_TABLET | Freq: Every day | ORAL | 11 refills | Status: DC

## 2017-03-15 MED ORDER — METFORMIN HCL 1000 MG PO TABS: 1000.0000 mg | ORAL_TABLET | Freq: Two times a day (BID) | ORAL | 11 refills | Status: DC

## 2017-03-18 MED FILL — metFORMIN HCL 1000 MG TABS: 1000 | 30 days supply | Qty: 60 | Fill #0

## 2017-03-18 MED FILL — LISINOPRIL 40 MG TAB: 40 | 30 days supply | Qty: 30 | Fill #0

## 2017-03-20 MED FILL — NITROFURANTOIN MONO-MCR 100: 100 | 7 days supply | Qty: 14 | Fill #0

## 2017-03-25 ENCOUNTER — Other Ambulatory Visit: Payer: Self-pay | Admitting: Oncology

## 2017-03-25 ENCOUNTER — Ambulatory Visit: Payer: Medicare Other | Admitting: Oncology

## 2017-03-25 DIAGNOSIS — D649 Anemia, unspecified: Secondary | ICD-10-CM

## 2017-04-03 ENCOUNTER — Other Ambulatory Visit: Payer: Self-pay | Admitting: Pharmacist

## 2017-04-03 DIAGNOSIS — I10 Essential (primary) hypertension: Secondary | ICD-10-CM

## 2017-04-04 MED ORDER — AMLODIPINE BESYLATE 10 MG PO TABS: 10.0000 mg | ORAL_TABLET | Freq: Every day | ORAL | 1 refills | Status: DC

## 2017-04-05 ENCOUNTER — Other Ambulatory Visit: Payer: Self-pay | Admitting: Pharmacist

## 2017-04-05 DIAGNOSIS — E785 Hyperlipidemia, unspecified: Secondary | ICD-10-CM

## 2017-04-05 MED ORDER — ATORVASTATIN CALCIUM 80 MG PO TABS: 80.0000 mg | ORAL_TABLET | Freq: Every day | ORAL | 3 refills | Status: DC

## 2017-04-05 MED FILL — AMLODIPINE BESYLATE 10 MG T: 10 | 30 days supply | Qty: 30 | Fill #0

## 2017-04-05 MED FILL — TAMSULOSIN HCL 0.4 MG CAP: 0.4 | 30 days supply | Qty: 30 | Fill #1

## 2017-04-05 MED FILL — ATORVASTATIN 80 MG TABLET: 80 | 30 days supply | Qty: 30 | Fill #1

## 2017-04-11 ENCOUNTER — Encounter (INDEPENDENT_AMBULATORY_CARE_PROVIDER_SITE_OTHER): Payer: Self-pay

## 2017-04-11 ENCOUNTER — Other Ambulatory Visit (INDEPENDENT_AMBULATORY_CARE_PROVIDER_SITE_OTHER): Payer: Medicare Other

## 2017-04-11 DIAGNOSIS — D649 Anemia, unspecified: Secondary | ICD-10-CM

## 2017-04-12 LAB — CBC WITH DIFFERENTIAL/PLATELET
Basophils Absolute: 0 10*3/uL (ref 0.0–0.2)
Basos: 0 %
EOS (ABSOLUTE): 0.1 10*3/uL (ref 0.0–0.4)
Eos: 1 %
Hematocrit: 28.8 % — ABNORMAL LOW (ref 37.5–51.0)
Hemoglobin: 9.9 g/dL — ABNORMAL LOW (ref 13.0–17.7)
Immature Grans (Abs): 0 10*3/uL (ref 0.0–0.1)
Immature Granulocytes: 0 %
Lymphocytes Absolute: 2.2 10*3/uL (ref 0.7–3.1)
Lymphs: 27 %
MCH: 30 pg (ref 26.6–33.0)
MCHC: 34.4 g/dL (ref 31.5–35.7)
MCV: 87 fL (ref 79–97)
Monocytes Absolute: 0.4 10*3/uL (ref 0.1–0.9)
Monocytes: 5 %
Neutrophils Absolute: 5.5 10*3/uL (ref 1.4–7.0)
Neutrophils: 67 %
Platelets: 327 10*3/uL (ref 150–379)
RBC: 3.3 x10E6/uL — ABNORMAL LOW (ref 4.14–5.80)
RDW: 14.3 % (ref 12.3–15.4)
WBC: 8.2 10*3/uL (ref 3.4–10.8)

## 2017-04-12 LAB — KAPPA/LAMBDA LIGHT CHAINS
Ig Kappa Free Light Chain: 29.4 mg/L — ABNORMAL HIGH (ref 3.3–19.4)
Ig Lambda Free Light Chain: 25.4 mg/L (ref 5.7–26.3)
Kappa/Lambda FluidC Ratio: 1.16 (ref 0.26–1.65)

## 2017-04-12 LAB — PROTEIN ELECTROPHORESIS, SERUM
A/G Ratio: 1.2 (ref 0.7–1.7)
Albumin ELP: 3 g/dL (ref 2.9–4.4)
Alpha 1: 0.2 g/dL (ref 0.0–0.4)
Alpha 2: 0.7 g/dL (ref 0.4–1.0)
Beta: 0.8 g/dL (ref 0.7–1.3)
Gamma Globulin: 0.8 g/dL (ref 0.4–1.8)
Globulin, Total: 2.6 g/dL (ref 2.2–3.9)

## 2017-04-12 LAB — IMMUNOFIXATION ELECTROPHORESIS
IgA/Immunoglobulin A, Serum: 274 mg/dL (ref 61–437)
IgG (Immunoglobin G), Serum: 793 mg/dL (ref 700–1600)
IgM (Immunoglobulin M), Srm: 75 mg/dL (ref 20–172)
Total Protein: 5.6 g/dL — ABNORMAL LOW (ref 6.0–8.5)

## 2017-04-22 MED FILL — metFORMIN HCL 1000 MG TABS: 1000 | 30 days supply | Qty: 60 | Fill #1

## 2017-04-22 MED FILL — LISINOPRIL 40 MG TABLET: 40 | 30 days supply | Qty: 30 | Fill #1

## 2017-04-23 ENCOUNTER — Ambulatory Visit (INDEPENDENT_AMBULATORY_CARE_PROVIDER_SITE_OTHER): Payer: Medicare Other | Admitting: Sports Medicine

## 2017-04-23 ENCOUNTER — Encounter: Payer: Self-pay | Admitting: Sports Medicine

## 2017-04-23 DIAGNOSIS — B351 Tinea unguium: Secondary | ICD-10-CM | POA: Diagnosis not present

## 2017-04-23 DIAGNOSIS — E1142 Type 2 diabetes mellitus with diabetic polyneuropathy: Secondary | ICD-10-CM

## 2017-04-23 DIAGNOSIS — M79675 Pain in left toe(s): Secondary | ICD-10-CM

## 2017-04-23 DIAGNOSIS — M79674 Pain in right toe(s): Secondary | ICD-10-CM

## 2017-04-23 NOTE — Progress Notes (Signed)
Subjective: Dennis Macias is a 66 y.o. male patient with history of diabetes who presents to office today complaining of long, painful nails  while ambulating in shoes; unable to trim. Patient states that the glucose reading this morning was not recorded.  No other issues noted.   Patient Active Problem List   Diagnosis Date Noted  . Syncope and collapse 02/26/2017  . Urinary retention   . Type 2 diabetes mellitus with background retinopathy ([place])   . Anemia 12/21/2016  . Diabetic retinopathy ([place]) 06/07/2016  . 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 Onychomycosis of multiple toenails with type 2 diabetes mellitus ([place]) 03/22/2016  . Healthcare maintenance 12/22/2015  . History of CVA (cerebrovascular accident) 09/13/2015  . Hyperlipidemia 09/13/2015  . Diabetes mellitus with retinopathy of both eyes ([place])   . Hypertension    Current Outpatient Prescriptions on File Prior to Visit  Medication Sig Dispense Refill  . amLODipine (NORVASC) 10 MG tablet Take 1 tablet (10 mg total) by mouth daily. IM TEACHING SERVICE. Thanks. 30 tablet 1  . aspirin 81 MG tablet Take 1 tablet (81 mg total) by mouth daily. Internal Medicine Program place on hold until patient requests to fill 90 tablet 3  . atorvastatin (LIPITOR) 80 MG tablet Take 1 tablet (80 mg total) by mouth at bedtime. IM program 90 tablet 3  . lisinopril (PRINIVIL,ZESTRIL) 40 MG tablet Take 1 tablet (40 mg total) by mouth daily. 30 tablet 11  . metFORMIN (GLUCOPHAGE) 1000 MG tablet Take 1 tablet (1,000 mg total) by mouth 2 (two) times daily with a meal. 60 tablet 11   Current Facility-Administered Medications on File Prior to Visit  Medication Dose Route Frequency Provider Last Rate Last Dose  . 0.9 %  sodium chloride infusion  500 mL Intravenous Continuous Ladene Artist, MD       No Known Allergies  Recent Results (from the past 2160 hour(s))  Basic metabolic panel     Status: Abnormal   Collection Time:  02/21/17 10:54 AM  Result Value Ref Range   Sodium 140 135 - 145 mmol/L   Potassium 3.7 3.5 - 5.1 mmol/L    Comment: SLIGHT HEMOLYSIS   Chloride 105 101 - 111 mmol/L   CO2 23 22 - 32 mmol/L   Glucose, Bld 89 65 - 99 mg/dL   BUN 17 6 - 20 mg/dL   Creatinine, Ser 1.34 (H) 0.61 - 1.24 mg/dL   Calcium 9.1 8.9 - 10.3 mg/dL   GFR calc non Af Amer 54 (L) >60 mL/min   GFR calc Af Amer >60 >60 mL/min    Comment: (NOTE) The eGFR has been calculated using the CKD EPI equation. This calculation has not been validated in all clinical situations. eGFR's persistently <60 mL/min signify possible Chronic Kidney Disease.    Anion gap 12 5 - 15  CBC     Status: Abnormal   Collection Time: 02/21/17 10:54 AM  Result Value Ref Range   WBC 9.4 4.0 - 10.5 K/uL   RBC 3.52 (L) 4.22 - 5.81 MIL/uL   Hemoglobin 10.5 (L) 13.0 - 17.0 g/dL   HCT 30.1 (L) 39.0 - 52.0 %   MCV 85.5 78.0 - 100.0 fL   MCH 29.8 26.0 - 34.0 pg   MCHC 34.9 30.0 - 36.0 g/dL   RDW 12.7 11.5 - 15.5 %   Platelets 319 150 - 400 K/uL  Urinalysis, Routine w reflex microscopic     Status: Abnormal  Collection Time: 02/21/17 11:38 AM  Result Value Ref Range   Color, Urine YELLOW YELLOW   APPearance CLEAR CLEAR   Specific Gravity, Urine 1.015 1.005 - 1.030   pH 5.0 5.0 - 8.0   Glucose, UA NEGATIVE NEGATIVE mg/dL   Hgb urine dipstick SMALL (A) NEGATIVE   Bilirubin Urine NEGATIVE NEGATIVE   Ketones, ur NEGATIVE NEGATIVE mg/dL   Protein, ur 100 (A) NEGATIVE mg/dL   Nitrite NEGATIVE NEGATIVE   Leukocytes, UA NEGATIVE NEGATIVE   RBC / HPF 0-5 0 - 5 RBC/hpf   WBC, UA 0-5 0 - 5 WBC/hpf   Bacteria, UA RARE (A) NONE SEEN   Squamous Epithelial / LPF 0-5 (A) NONE SEEN   Mucus PRESENT    Hyaline Casts, UA PRESENT    Sperm, UA PRESENT   CBG monitoring, ED     Status: None   Collection Time: 02/21/17 11:39 AM  Result Value Ref Range   Glucose-Capillary 88 65 - 99 mg/dL  CK     Status: None   Collection Time: 02/21/17  2:29 PM  Result  Value Ref Range   Total CK 67 49 - 397 U/L  Glucose, capillary     Status: Abnormal   Collection Time: 02/21/17  5:49 PM  Result Value Ref Range   Glucose-Capillary 108 (H) 65 - 99 mg/dL  Glucose, capillary     Status: Abnormal   Collection Time: 02/21/17  9:09 PM  Result Value Ref Range   Glucose-Capillary 141 (H) 65 - 99 mg/dL  Basic metabolic panel     Status: Abnormal   Collection Time: 02/22/17  4:47 AM  Result Value Ref Range   Sodium 139 135 - 145 mmol/L   Potassium 3.0 (L) 3.5 - 5.1 mmol/L    Comment: DELTA CHECK NOTED   Chloride 104 101 - 111 mmol/L   CO2 26 22 - 32 mmol/L   Glucose, Bld 128 (H) 65 - 99 mg/dL   BUN 13 6 - 20 mg/dL   Creatinine, Ser 1.08 0.61 - 1.24 mg/dL   Calcium 8.9 8.9 - 10.3 mg/dL   GFR calc non Af Amer >60 >60 mL/min   GFR calc Af Amer >60 >60 mL/min    Comment: (NOTE) The eGFR has been calculated using the CKD EPI equation. This calculation has not been validated in all clinical situations. eGFR's persistently <60 mL/min signify possible Chronic Kidney Disease.    Anion gap 9 5 - 15  Glucose, capillary     Status: Abnormal   Collection Time: 02/22/17  7:29 AM  Result Value Ref Range   Glucose-Capillary 156 (H) 65 - 99 mg/dL  Magnesium     Status: Abnormal   Collection Time: 02/22/17 10:20 AM  Result Value Ref Range   Magnesium 1.0 (L) 1.7 - 2.4 mg/dL  Glucose, capillary     Status: Abnormal   Collection Time: 02/22/17 11:35 AM  Result Value Ref Range   Glucose-Capillary 149 (H) 65 - 99 mg/dL  Glucose, capillary     Status: Abnormal   Collection Time: 02/22/17  4:10 PM  Result Value Ref Range   Glucose-Capillary 203 (H) 65 - 99 mg/dL  BMP8+Anion Gap     Status: Abnormal   Collection Time: 02/26/17  9:42 AM  Result Value Ref Range   Glucose 181 (H) 65 - 99 mg/dL   BUN 9 8 - 27 mg/dL   Creatinine, Ser 0.97 0.76 - 1.27 mg/dL   GFR calc non Af Amer 82 >59  mL/min/1.73   GFR calc Af Amer 94 >59 mL/min/1.73   BUN/Creatinine Ratio 9 (L) 10  - 24   Sodium 139 134 - 144 mmol/L   Potassium 3.9 3.5 - 5.2 mmol/L   Chloride 102 96 - 106 mmol/L   CO2 22 20 - 29 mmol/L   Anion Gap 15.0 10.0 - 18.0 mmol/L   Calcium 9.3 8.6 - 10.2 mg/dL  CBC with Diff     Status: Abnormal   Collection Time: 02/26/17  9:42 AM  Result Value Ref Range   WBC 8.6 3.4 - 10.8 x10E3/uL   RBC 3.52 (L) 4.14 - 5.80 x10E6/uL   Hemoglobin 10.5 (L) 13.0 - 17.7 g/dL   Hematocrit 30.8 (L) 37.5 - 51.0 %   MCV 88 79 - 97 fL   MCH 29.8 26.6 - 33.0 pg   MCHC 34.1 31.5 - 35.7 g/dL   RDW 14.1 12.3 - 15.4 %   Platelets 379 150 - 379 x10E3/uL   Neutrophils 71 Not Estab. %   Lymphs 23 Not Estab. %   Monocytes 5 Not Estab. %   Eos 1 Not Estab. %   Basos 0 Not Estab. %   Neutrophils Absolute 6.2 1.4 - 7.0 x10E3/uL   Lymphocytes Absolute 1.9 0.7 - 3.1 x10E3/uL   Monocytes Absolute 0.4 0.1 - 0.9 x10E3/uL   EOS (ABSOLUTE) 0.0 0.0 - 0.4 x10E3/uL   Basophils Absolute 0.0 0.0 - 0.2 x10E3/uL   Immature Granulocytes 0 Not Estab. %   Immature Grans (Abs) 0.0 0.0 - 0.1 x10E3/uL  RBC Folate     Status: None   Collection Time: 02/26/17  9:42 AM  Result Value Ref Range   Folate, Hemolysate 275.2 Not Estab. ng/mL   Folate, RBC 894 >498 ng/mL  Reticulocytes Count     Status: None   Collection Time: 02/26/17  9:42 AM  Result Value Ref Range   Retic Ct Pct 1.4 0.6 - 2.6 %  Methylmalonic Acid     Status: Abnormal   Collection Time: 02/26/17  9:42 AM  Result Value Ref Range   Methylmalonic Acid 573 (H) 0 - 378 nmol/L   Disclaimer: Comment     Comment: This test was developed and its performance characteristics determined by [facility]. It has not been cleared or approved by the Food and Drug Administration.   Protein electrophoresis, serum     Status: None   Collection Time: 04/11/17  9:08 AM  Result Value Ref Range   Albumin ELP 3.0 2.9 - 4.4 g/dL   Alpha 1 0.2 0.0 - 0.4 g/dL   Alpha 2 0.7 0.4 - 1.0 g/dL   Beta 0.8 0.7 - 1.3 g/dL   Gamma Globulin 0.8 0.4 - 1.8 g/dL    M-Spike, % Not Observed Not Observed g/dL   GLOBULIN, TOTAL 2.6 2.2 - 3.9 g/dL   A/G Ratio 1.2 0.7 - 1.7   Please Note: Comment     Comment: Protein electrophoresis scan will follow via computer, mail, or courier delivery.    Interpretation: Comment     Comment: The SPE pattern appears essentially unremarkable. Evidence of monoclonal protein is not apparent.   Immunofixation electrophoresis     Status: Abnormal   Collection Time: 04/11/17  9:08 AM  Result Value Ref Range   Total Protein 5.6 (L) 6.0 - 8.5 g/dL   Immunofixation Result, Serum Comment     Comment: An apparent normal immunofixation pattern.   IgG (Immunoglobin G), Serum 793 700 - 1,600 mg/dL  IgA/Immunoglobulin A, Serum 274 61 - 437 mg/dL   IgM (Immunoglobulin M), Srm 75 20 - 172 mg/dL  Kappa/lambda light chains     Status: Abnormal   Collection Time: 04/11/17  9:08 AM  Result Value Ref Range   Ig Kappa Free Light Chain 29.4 (H) 3.3 - 19.4 mg/L   Ig Lambda Free Light Chain 25.4 5.7 - 26.3 mg/L   Kappa/Lambda FluidC Ratio 1.16 0.26 - 1.65  CBC with Differential/Platelet     Status: Abnormal   Collection Time: 04/11/17  9:08 AM  Result Value Ref Range   WBC 8.2 3.4 - 10.8 x10E3/uL   RBC 3.30 (L) 4.14 - 5.80 x10E6/uL   Hemoglobin 9.9 (L) 13.0 - 17.7 g/dL   Hematocrit 28.8 (L) 37.5 - 51.0 %   MCV 87 79 - 97 fL   MCH 30.0 26.6 - 33.0 pg   MCHC 34.4 31.5 - 35.7 g/dL   RDW 14.3 12.3 - 15.4 %   Platelets 327 150 - 379 x10E3/uL   Neutrophils 67 Not Estab. %   Lymphs 27 Not Estab. %   Monocytes 5 Not Estab. %   Eos 1 Not Estab. %   Basos 0 Not Estab. %   Neutrophils Absolute 5.5 1.4 - 7.0 x10E3/uL   Lymphocytes Absolute 2.2 0.7 - 3.1 x10E3/uL   Monocytes Absolute 0.4 0.1 - 0.9 x10E3/uL   EOS (ABSOLUTE) 0.1 0.0 - 0.4 x10E3/uL   Basophils Absolute 0.0 0.0 - 0.2 x10E3/uL   Immature Granulocytes 0 Not Estab. %   Immature Grans (Abs) 0.0 0.0 - 0.1 x10E3/uL    Objective: General: Patient is awake, alert, and oriented  x 3 and in no acute distress.  Integument: Skin is warm, dry and supple bilateral. Nails are tender, long, thickened and dystrophic with subungual debris, consistent with onychomycosis, 1-5 bilateral. No signs of infection. No open lesions or preulcerative lesions present bilateral. Remaining integument unremarkable.  Vasculature:  Dorsalis Pedis pulse1/4 bilateral. Posterior Tibial pulse 1 /4 bilateral. Capillary fill time <3 sec 1-5 bilateral. No hair growth to the level of the digits.Temperature gradient within normal limits. No varicosities present bilateral. No edema present bilateral.   Neurology: The patient has absent sensation measured with a 5.07/10g Semmes Weinstein Monofilament at all pedal sites bilateral. Vibratory absent diminished bilateral with tuning fork. No Babinski sign present bilateral.   Musculoskeletal: No symptomatic pedal bony deformities noted bilateral. Muscular strength 5/5 in all lower extremity muscular groups bilateral without pain on range of motion. No tenderness with calf compression bilateral.  Assessment and Plan: Problem List Items Addressed This Visit    None    Visit Diagnoses    Dermatophytosis of nail    -  Primary   Toe pain, bilateral       Diabetic polyneuropathy associated with type 2 diabetes mellitus ([place])          -Examined patient. -Discussed and educated patient on diabetic foot care, especially with  regards to the vascular, neurological and musculoskeletal systems.  -Stressed the importance of good glycemic control and the detriment of not controlling glucose levels in relation to the foot. -Mechanically debrided all nails 1-5 bilateral using sterile nail nipper and filed with dremel without incident  -Answered all patient questions -Patient to return  in 3 months for at risk foot care -Patient advised to call the office if any problems or questions arise in the meantime.  Landis Martins, DPM

## 2017-04-25 ENCOUNTER — Telehealth: Payer: Self-pay | Admitting: *Deleted

## 2017-04-25 NOTE — Telephone Encounter (Signed)
-----   Message from Annia Belt, MD sent at 04/25/2017  8:55 AM EDT ----- Call pt: special blood tests to look for abnormal antibodies in his blood are normal. He is still anemic and needs to keep his appt w me.

## 2017-04-25 NOTE — Telephone Encounter (Signed)
Pt called / informed "special blood tests to look for abnormal antibodies in his blood are normal. He is still anemic and needs to keep his appt w me." per Dr Beryle Beams. He does not have an appt to see Dr Darnell Level - he has an appt in December to see his PCP, Dr Juleen China. I will send message to Tamela Oddi S to schedule pt an appt.

## 2017-04-25 NOTE — Telephone Encounter (Signed)
OK  Not urgent - I believe he was a no show for his new pt appt w me

## 2017-05-09 MED FILL — ATORVASTATIN 80 MG TABLET: 80 | 30 days supply | Qty: 30 | Fill #2

## 2017-05-09 MED FILL — AMLODIPINE BESYLATE 10 MG T: 10 | 30 days supply | Qty: 30 | Fill #1

## 2017-05-09 MED FILL — TAMSULOSIN HCL 0.4 MG CAP: 0.4 | 30 days supply | Qty: 30 | Fill #2

## 2017-05-21 MED FILL — LISINOPRIL 40 MG TABLET: 40 | 30 days supply | Qty: 30 | Fill #2

## 2017-05-21 MED FILL — metFORMIN HCL 1000 MG TABS: 1000 | 30 days supply | Qty: 60 | Fill #2

## 2017-06-11 ENCOUNTER — Other Ambulatory Visit: Payer: Self-pay | Admitting: Pharmacist

## 2017-06-11 DIAGNOSIS — I10 Essential (primary) hypertension: Secondary | ICD-10-CM

## 2017-06-11 MED ORDER — AMLODIPINE BESYLATE 10 MG PO TABS: 10.0000 mg | ORAL_TABLET | Freq: Every day | ORAL | 1 refills | Status: DC

## 2017-06-11 MED FILL — AMLODIPINE BESYLATE 10 MG T: 10 | 30 days supply | Qty: 30 | Fill #1

## 2017-06-11 MED FILL — ATORVASTATIN 80 MG TABLET: 80 | 30 days supply | Qty: 30 | Fill #3

## 2017-06-12 DIAGNOSIS — R350 Frequency of micturition: Secondary | ICD-10-CM | POA: Diagnosis not present

## 2017-06-12 DIAGNOSIS — R351 Nocturia: Secondary | ICD-10-CM | POA: Diagnosis not present

## 2017-06-12 MED FILL — TAMSULOSIN HCL 0.4 MG CAP: 0.4 | 30 days supply | Qty: 30 | Fill #0

## 2017-06-13 ENCOUNTER — Encounter: Payer: Self-pay | Admitting: Internal Medicine

## 2017-06-13 ENCOUNTER — Ambulatory Visit (INDEPENDENT_AMBULATORY_CARE_PROVIDER_SITE_OTHER): Payer: Medicare Other | Admitting: Internal Medicine

## 2017-06-13 ENCOUNTER — Other Ambulatory Visit: Payer: Self-pay

## 2017-06-13 VITALS — BP 135/75 | HR 82 | Temp 98.2°F | Ht 72.0 in | Wt 162.0 lb

## 2017-06-13 DIAGNOSIS — I10 Essential (primary) hypertension: Secondary | ICD-10-CM

## 2017-06-13 DIAGNOSIS — Z23 Encounter for immunization: Secondary | ICD-10-CM

## 2017-06-13 DIAGNOSIS — R339 Retention of urine, unspecified: Secondary | ICD-10-CM

## 2017-06-13 DIAGNOSIS — E11319 Type 2 diabetes mellitus with unspecified diabetic retinopathy without macular edema: Secondary | ICD-10-CM | POA: Diagnosis present

## 2017-06-13 LAB — GLUCOSE, CAPILLARY: Glucose-Capillary: 205 mg/dL — ABNORMAL HIGH (ref 65–99)

## 2017-06-13 LAB — POCT GLYCOSYLATED HEMOGLOBIN (HGB A1C): Hemoglobin A1C: 6.6

## 2017-06-13 NOTE — Assessment & Plan Note (Signed)
A: No further issues.  Has been following with urology who maintains him on tamsulosin.  P: - continue tamsulosin

## 2017-06-13 NOTE — Assessment & Plan Note (Signed)
BP Readings from Last 3 Encounters:  06/13/17 135/75  03/06/17 129/72  02/26/17 136/65   A:  BP Today is under good control.  No further syncopal episodes.  No chest pain or SOB with exertion.  Bmet earlier this year unremarkable.  P: - continue current management with lisinopril and amlodipine - rtc 6 months

## 2017-06-13 NOTE — Progress Notes (Signed)
   CC: here for DM and BP follow up  HPI:  Mr.Daylin Wiedemann is a 66 y.o. man with a past medical history listed below here today for follow up of his HTN and DM.   For details of today's visit and the status of his chronic medical issues please refer to the assessment and plan.   Past Medical History:  Diagnosis Date  . Anemia   . Dehydration   . Diabetes ([place])   . Glaucoma   . History of urinary retention   . Hyperlipidemia   . Hypertension   . Stroke [facility])    Review of Systems:  Please see pertinent ROS reviewed in HPI and problem based charting.   Physical Exam:  Vitals:   06/13/17 1320  BP: 135/75  Pulse: 82  Temp: 98.2 F (36.8 C)  TempSrc: Oral  SpO2: 100%  Weight: 162 lb (73.5 kg)  Height: 6' (1.829 m)   General: NAD HEENT: NCAT, EOMI, no scleral icterus Cardiac: RRR Pulm: normal effort GU: no longer has foley Ext: warm and well perfused, no pedal edema Neuro: alert and oriented X3, cranial nerves II-XII grossly intact   Assessment & Plan:   See Encounters Tab for problem based charting.  Patient discussed with Dr. Dareen Piano .  Hypertension BP Readings from Last 3 Encounters:  06/13/17 135/75  03/06/17 129/72  02/26/17 136/65   A:  BP Today is under good control.  No further syncopal episodes.  No chest pain or SOB with exertion.  Bmet earlier this year unremarkable.  P: - continue current management with lisinopril and amlodipine - rtc 6 months  Diabetes mellitus with retinopathy of both eyes ([place]) Lab Results  Component Value Date   HGBA1C 6.6 06/13/2017    A1c today is 6.6 on metformin alone.  No complaints of polyuria or polydipsia.  Does not check blood sugars at home.  P: - continue current management with metformin monotherapy - rtc 6 months  Urinary retention A: No further issues.  Has been following with urology who maintains him on tamsulosin.  P: - continue tamsulosin

## 2017-06-13 NOTE — Patient Instructions (Signed)
FOLLOW-UP INSTRUCTIONS When: Next available appointment with Dr. Beryle Beams to further evaluate anemia.  Follow up appointment with me in 6 months For: Anemia with Dr. Darnell Level.  Routine follow up with me. What to bring: current medications

## 2017-06-13 NOTE — Assessment & Plan Note (Signed)
Lab Results  Component Value Date   HGBA1C 6.6 06/13/2017    A1c today is 6.6 on metformin alone.  No complaints of polyuria or polydipsia.  Does not check blood sugars at home.  P: - continue current management with metformin monotherapy - rtc 6 months

## 2017-06-24 ENCOUNTER — Ambulatory Visit: Payer: Medicare Other | Admitting: Oncology

## 2017-06-24 MED FILL — metFORMIN HCL 1000 MG TABS: 1000 | 30 days supply | Qty: 60 | Fill #3

## 2017-06-24 MED FILL — LISINOPRIL 40 MG TABLET: 40 | 30 days supply | Qty: 30 | Fill #3

## 2017-07-10 NOTE — Progress Notes (Signed)
Internal Medicine Clinic Attending  Case discussed with Dr. Wallace at the time of the visit.  We reviewed the resident's history and exam and pertinent patient test results.  I agree with the assessment, diagnosis, and plan of care documented in the resident's note.  

## 2017-07-12 MED FILL — TAMSULOSIN HCL 0.4 MG CAP: 0.4 | 30 days supply | Qty: 30 | Fill #1

## 2017-07-12 MED FILL — ATORVASTATIN 80 MG TABLET: 80 | 30 days supply | Qty: 30 | Fill #4

## 2017-07-12 MED FILL — AMLODIPINE BESYLATE 10 MG T: 10 | 30 days supply | Qty: 30 | Fill #2

## 2017-07-26 ENCOUNTER — Emergency Department (HOSPITAL_COMMUNITY)
Admission: EM | Admit: 2017-07-26 | Discharge: 2017-07-27 | Disposition: A | Discharge status: Home / Self Care | Payer: Medicare PPO | Attending: Emergency Medicine | Admitting: Emergency Medicine

## 2017-07-26 ENCOUNTER — Encounter (HOSPITAL_COMMUNITY): Payer: Self-pay | Admitting: *Deleted

## 2017-07-26 DIAGNOSIS — R55 Syncope and collapse: Secondary | ICD-10-CM

## 2017-07-26 DIAGNOSIS — E119 Type 2 diabetes mellitus without complications: Secondary | ICD-10-CM | POA: Insufficient documentation

## 2017-07-26 DIAGNOSIS — Z79899 Other long term (current) drug therapy: Secondary | ICD-10-CM | POA: Insufficient documentation

## 2017-07-26 DIAGNOSIS — Z7984 Long term (current) use of oral hypoglycemic drugs: Secondary | ICD-10-CM | POA: Diagnosis not present

## 2017-07-26 DIAGNOSIS — Z7982 Long term (current) use of aspirin: Secondary | ICD-10-CM | POA: Diagnosis not present

## 2017-07-26 DIAGNOSIS — Z8673 Personal history of transient ischemic attack (TIA), and cerebral infarction without residual deficits: Secondary | ICD-10-CM | POA: Insufficient documentation

## 2017-07-26 DIAGNOSIS — I951 Orthostatic hypotension: Secondary | ICD-10-CM | POA: Diagnosis not present

## 2017-07-26 DIAGNOSIS — I10 Essential (primary) hypertension: Secondary | ICD-10-CM | POA: Insufficient documentation

## 2017-07-26 DIAGNOSIS — Z87891 Personal history of nicotine dependence: Secondary | ICD-10-CM | POA: Diagnosis not present

## 2017-07-26 DIAGNOSIS — R404 Transient alteration of awareness: Secondary | ICD-10-CM | POA: Diagnosis not present

## 2017-07-26 LAB — CBC
HCT: 31.3 % — ABNORMAL LOW (ref 39.0–52.0)
Hemoglobin: 10.6 g/dL — ABNORMAL LOW (ref 13.0–17.0)
MCH: 30.6 pg (ref 26.0–34.0)
MCHC: 33.9 g/dL (ref 30.0–36.0)
MCV: 90.5 fL (ref 78.0–100.0)
Platelets: 349 10*3/uL (ref 150–400)
RBC: 3.46 MIL/uL — ABNORMAL LOW (ref 4.22–5.81)
RDW: 12.9 % (ref 11.5–15.5)
WBC: 8 10*3/uL (ref 4.0–10.5)

## 2017-07-26 LAB — BASIC METABOLIC PANEL
Anion gap: 11 (ref 5–15)
BUN: 13 mg/dL (ref 6–20)
CO2: 23 mmol/L (ref 22–32)
Calcium: 9.1 mg/dL (ref 8.9–10.3)
Chloride: 105 mmol/L (ref 101–111)
Creatinine, Ser: 1.2 mg/dL (ref 0.61–1.24)
GFR calc Af Amer: >60 mL/min (ref >60)
GFR calc non Af Amer: >60 mL/min (ref >60)
Glucose, Bld: 161 mg/dL — ABNORMAL HIGH (ref 65–99)
Potassium: 4 mmol/L (ref 3.5–5.1)
Sodium: 139 mmol/L (ref 135–145)

## 2017-07-26 MED ORDER — SODIUM CHLORIDE 0.9 % IV BOLUS (SEPSIS)
1000.0000 mL | Freq: Once | INTRAVENOUS | Status: AC
  Administered 2017-07-27: 1000 mL via INTRAVENOUS

## 2017-07-26 MED FILL — LISINOPRIL 40 MG TABLET: 40 | 30 days supply | Qty: 30 | Fill #4

## 2017-07-26 MED FILL — metFORMIN HCL 1000 MG TABS: 1000 | 30 days supply | Qty: 60 | Fill #4

## 2017-07-26 NOTE — ED Notes (Signed)
ED Provider at bedside. 

## 2017-07-26 NOTE — ED Notes (Signed)
Pt/family concerned about getting night time meds.

## 2017-07-26 NOTE — ED Triage Notes (Signed)
Pt was at pharmacy getting his meds filled and had a dizzy spell was assisited to floor did not hit head, pt told ems that he has had similar episodes before and was dehydrated, did not want to come but ems states that he had no othewr way to get to er

## 2017-07-26 NOTE — ED Provider Notes (Signed)
[facility] EMERGENCY DEPARTMENT Provider Note   CSN: [id number] Arrival date & time: 07/26/17  1521     History   Chief Complaint Chief Complaint  Patient presents with  . Dizziness    HPI Dennis Macias is a 67 y.o. male.  Patient with PMH of HTN, DM, HL, stroke, presents to the ED with a chief complaint of syncope.  He states that he was walking to the pharmacy, became lightheaded and passed out.  He was caught by an off duty Therapist, sports.  He did not injure himself.  He denies having had CP or SOB prior to the episode.  He states that he did feel very hot. Something similar happened a couple of months ago and his PCP thought it was from dehydration and HCTZ use.  He was switched off of this medication.  He states that he feels fine now.  There are no other associated symptoms.   The history is provided by the patient. No language interpreter was used.    Past Medical History:  Diagnosis Date  . Anemia   . Dehydration   . Diabetes ([place])   . Glaucoma   . History of urinary retention   . Hyperlipidemia   . Hypertension   . Stroke [facility])     Patient Active Problem List   Diagnosis Date Noted  . Syncope and collapse 02/26/2017  . Urinary retention   . Anemia 12/21/2016  . Diabetic retinopathy ([place]) 06/07/2016  . 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 Onychomycosis of multiple toenails with type 2 diabetes mellitus ([place]) 03/22/2016  . Healthcare maintenance 12/22/2015  . History of CVA (cerebrovascular accident) 09/13/2015  . Hyperlipidemia 09/13/2015  . Diabetes mellitus with retinopathy of both eyes ([place])   . Hypertension     Past Surgical History:  Procedure Laterality Date  . TEE WITHOUT CARDIOVERSION N/A 09/14/2015   Procedure: TRANSESOPHAGEAL ECHOCARDIOGRAM (TEE);  Surgeon: Larey Dresser, MD;  Location: [facility];  Service: Cardiovascular;  Laterality: N/A;       Home Medications    Prior to Admission medications     Medication Sig Start Date End Date Taking? Authorizing Provider  acetaminophen (TYLENOL) 500 MG tablet Take 500 mg by mouth every 6 (six) hours as needed.   Yes [provider]  amLODipine (NORVASC) 10 MG tablet Take 1 tablet (10 mg total) by mouth daily. IM TEACHING SERVICE. Thanks. 06/11/17  Yes Jule Ser, DO  aspirin 81 MG tablet Take 1 tablet (81 mg total) by mouth daily. Internal Medicine Program place on hold until patient requests to fill 01/27/16  Yes Jule Ser, DO  atorvastatin (LIPITOR) 80 MG tablet Take 1 tablet (80 mg total) by mouth at bedtime. IM program 04/05/17  Yes Jule Ser, DO  lisinopril (PRINIVIL,ZESTRIL) 40 MG tablet Take 1 tablet (40 mg total) by mouth daily. 03/15/17 03/15/18 Yes Jule Ser, DO  metFORMIN (GLUCOPHAGE) 1000 MG tablet Take 1 tablet (1,000 mg total) by mouth 2 (two) times daily with a meal. 03/15/17  Yes Jule Ser, DO  tamsulosin (FLOMAX) 0.4 MG CAPS capsule Take 0.4 mg by mouth at bedtime.  05/09/17  Yes [provider]    Family History Family History  Problem Relation Age of Onset  . Hypertension Mother   . Hyperlipidemia Mother   . Hyperlipidemia Father   . Colon cancer Neg Hx   . Colon polyps Neg Hx   . Esophageal cancer Neg Hx   . Rectal cancer  Neg Hx   . Stomach cancer Neg Hx     Social History Social History   Tobacco Use  . Smoking status: Former Research scientist (life sciences)  . Smokeless tobacco: Former Systems developer    Types: Chew    Quit date: 07/09/1978  . Tobacco comment: quit 1 year ago  Substance Use Topics  . Alcohol use: Yes    Alcohol/week: 0.6 oz    Types: 1 Cans of beer per week    Comment: quit last march/2017  . Drug use: No     Allergies   Patient has no known allergies.   Review of Systems Review of Systems  All other systems reviewed and are negative.    Physical Exam Updated Vital Signs BP (!) 159/87   Pulse 79   Temp 97.9 F (36.6 C) (Oral)   Resp 18   SpO2 100%   Physical Exam   Constitutional: He is oriented to person, place, and time. He appears well-developed and well-nourished.  HENT:  Head: Normocephalic and atraumatic.  Eyes: Conjunctivae and EOM are normal. Pupils are equal, round, and reactive to light. Right eye exhibits no discharge. Left eye exhibits no discharge. No scleral icterus.  Neck: Normal range of motion. Neck supple. No JVD present.  Cardiovascular: Normal rate, regular rhythm and normal heart sounds. Exam reveals no gallop and no friction rub.  No murmur heard. Pulmonary/Chest: Effort normal and breath sounds normal. No respiratory distress. He has no wheezes. He has no rales. He exhibits no tenderness.  Abdominal: Soft. He exhibits no distension and no mass. There is no tenderness. There is no rebound and no guarding.  Musculoskeletal: Normal range of motion. He exhibits no edema or tenderness.  Neurological: He is alert and oriented to person, place, and time.  Skin: Skin is warm and dry.  Psychiatric: He has a normal mood and affect. His behavior is normal. Judgment and thought content normal.  Nursing note and vitals reviewed.    ED Treatments / Results  Labs (all labs ordered are listed, but only abnormal results are displayed) Labs Reviewed  BASIC METABOLIC PANEL - Abnormal; Notable for the following components:      Result Value   Glucose, Bld 161 (*)    All other components within normal limits  CBC - Abnormal; Notable for the following components:   RBC 3.46 (*)    Hemoglobin 10.6 (*)    HCT 31.3 (*)    All other components within normal limits  URINALYSIS, ROUTINE W REFLEX MICROSCOPIC  TROPONIN I  ETHANOL  CBG MONITORING, ED    EKG  EKG Interpretation None       Radiology No results found.  Procedures Procedures (including critical care time)  Medications Ordered in ED Medications - No data to display   Initial Impression / Assessment and Plan / ED Course  I have reviewed the triage vital signs and the  nursing notes.  Pertinent labs & imaging results that were available during my care of the patient were reviewed by me and considered in my medical decision making (see chart for details).     Patient with syncopal episode today.  He has a hx of the same 2/2 orthostasis and dehydration.  Will check labs, EKG, and reassess.  Patient feels well now.  11:49 PM Patient is orthostatic.  Will give fluids and reassess.  6:07 AM Patient remains symptom free.  His BP has improved dramatically with fluids.  Seen by and discussed with Dr. Leonette Monarch, who recommends outpatient  follow-up. Final Clinical Impressions(s) / ED Diagnoses   Final diagnoses:  Syncope, unspecified syncope type  Orthostatic hypotension    ED Discharge Orders    None       Montine Circle, PA-C 07/27/17 7741    Fatima Blank, MD 08/01/17 [phone]

## 2017-07-27 LAB — TROPONIN I
Troponin I: <0.03 ng/mL (ref <0.03)
Troponin I: <0.03 ng/mL (ref <0.03)

## 2017-07-27 LAB — ETHANOL: Alcohol, Ethyl (B): <10 mg/dL (ref <10)

## 2017-07-27 LAB — URINALYSIS, ROUTINE W REFLEX MICROSCOPIC
Bilirubin Urine: NEGATIVE
Glucose, UA: NEGATIVE mg/dL
Hgb urine dipstick: NEGATIVE
Ketones, ur: NEGATIVE mg/dL
Nitrite: NEGATIVE
Protein, ur: >=300 mg/dL — AB
Specific Gravity, Urine: 1.017 (ref 1.005–1.030)
Squamous Epithelial / LPF: NONE SEEN
pH: 6 (ref 5.0–8.0)

## 2017-07-27 MED ORDER — SODIUM CHLORIDE 0.9 % IV BOLUS (SEPSIS)
1000.0000 mL | Freq: Once | INTRAVENOUS | Status: AC
  Administered 2017-07-27: 1000 mL via INTRAVENOUS

## 2017-07-27 NOTE — ED Notes (Signed)
Pt stood for ortho VS. Pt stands on own ability and appears steady on feet. Pt reports feeling strong no light-headed feelings.

## 2017-07-27 NOTE — ED Notes (Signed)
Pt encouraged to use restroom. Pt still doesn't need to go.

## 2017-07-27 NOTE — ED Notes (Signed)
Pt assisted in walking to restroom. Could not produce urine. Pt requesting night time Tamsulosin.

## 2017-07-30 ENCOUNTER — Ambulatory Visit: Payer: Medicare PPO | Admitting: Sports Medicine

## 2017-07-30 ENCOUNTER — Encounter: Payer: Self-pay | Admitting: Sports Medicine

## 2017-07-30 DIAGNOSIS — M79675 Pain in left toe(s): Secondary | ICD-10-CM

## 2017-07-30 DIAGNOSIS — E1142 Type 2 diabetes mellitus with diabetic polyneuropathy: Secondary | ICD-10-CM

## 2017-07-30 DIAGNOSIS — M79674 Pain in right toe(s): Secondary | ICD-10-CM

## 2017-07-30 DIAGNOSIS — B351 Tinea unguium: Secondary | ICD-10-CM

## 2017-07-30 NOTE — Progress Notes (Signed)
Subjective: Dennis Macias is a 67 y.o. male patient with history of diabetes who presents to office today complaining of long, painful nails  while ambulating in shoes; unable to trim. Patient states that the glucose reading this morning was not recorded. States he had to go to hospital for dehydration.  No other issues noted.   Patient Active Problem List   Diagnosis Date Noted  . Syncope and collapse 02/26/2017  . Urinary retention   . Anemia 12/21/2016  . Diabetic retinopathy ([place]) 06/07/2016  . 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 Onychomycosis of multiple toenails with type 2 diabetes mellitus ([place]) 03/22/2016  . Healthcare maintenance 12/22/2015  . History of CVA (cerebrovascular accident) 09/13/2015  . Hyperlipidemia 09/13/2015  . Diabetes mellitus with retinopathy of both eyes ([employer])   . Hypertension    Current Outpatient Medications on File Prior to Visit  Medication Sig Dispense Refill  . acetaminophen (TYLENOL) 500 MG tablet Take 500 mg by mouth every 6 (six) hours as needed.    Marland Kitchen amLODipine (NORVASC) 10 MG tablet Take 1 tablet (10 mg total) by mouth daily. IM TEACHING SERVICE. Thanks. 30 tablet 1  . aspirin 81 MG tablet Take 1 tablet (81 mg total) by mouth daily. Internal Medicine Program place on hold until patient requests to fill 90 tablet 3  . atorvastatin (LIPITOR) 80 MG tablet Take 1 tablet (80 mg total) by mouth at bedtime. IM program 90 tablet 3  . lisinopril (PRINIVIL,ZESTRIL) 40 MG tablet Take 1 tablet (40 mg total) by mouth daily. 30 tablet 11  . metFORMIN (GLUCOPHAGE) 1000 MG tablet Take 1 tablet (1,000 mg total) by mouth 2 (two) times daily with a meal. 60 tablet 11  . tamsulosin (FLOMAX) 0.4 MG CAPS capsule Take 0.4 mg by mouth at bedtime.   3   Current Facility-Administered Medications on File Prior to Visit  Medication Dose Route Frequency Provider Last Rate Last Dose  . 0.9 %  sodium chloride infusion  500 mL Intravenous Continuous  Ladene Artist, MD       No Known Allergies    Objective: General: Patient is awake, alert, and oriented x 3 and in no acute distress.  Integument: Skin is warm, dry and supple bilateral. Nails are tender, long, thickened and dystrophic with subungual debris, consistent with onychomycosis, 1-5 bilateral. No signs of infection. No open lesions or preulcerative lesions present bilateral. Remaining integument unremarkable.  Vasculature:  Dorsalis Pedis pulse1/4 bilateral. Posterior Tibial pulse 1 /4 bilateral. Capillary fill time <3 sec 1-5 bilateral. No hair growth to the level of the digits.Temperature gradient within normal limits. No varicosities present bilateral. No edema present bilateral.   Neurology: The patient has absent sensation measured with a 5.07/10g Semmes Weinstein Monofilament at all pedal sites bilateral. Vibratory absent diminished bilateral with tuning fork. No Babinski sign present bilateral.   Musculoskeletal: No symptomatic pedal bony deformities noted bilateral. Muscular strength 5/5 in all lower extremity muscular groups bilateral without pain on range of motion. No tenderness with calf compression bilateral.  Assessment and Plan: Problem List Items Addressed This Visit    None    Visit Diagnoses    Dermatophytosis of nail    -  Primary   Toe pain, bilateral       Diabetic polyneuropathy associated with type 2 diabetes mellitus ([place])          -Examined patient. -Discussed and educated patient on diabetic foot care, especially with  regards to the vascular,  neurological and musculoskeletal systems.  -Stressed the importance of good glycemic control and the detriment of not controlling glucose levels in relation to the foot. -Mechanically debrided all nails 1-5 bilateral using sterile nail nipper and filed with dremel without incident  -Answered all patient questions -Patient to return  in 3 months for at risk foot care -Patient advised to call the office if  any problems or questions arise in the meantime.  Landis Martins, DPM

## 2017-08-01 ENCOUNTER — Ambulatory Visit (INDEPENDENT_AMBULATORY_CARE_PROVIDER_SITE_OTHER): Payer: Medicare PPO | Admitting: Internal Medicine

## 2017-08-01 ENCOUNTER — Encounter: Payer: Self-pay | Admitting: Internal Medicine

## 2017-08-01 VITALS — BP 134/71 | HR 83 | Temp 98.2°F | Ht 72.0 in | Wt 162.6 lb

## 2017-08-01 DIAGNOSIS — R61 Generalized hyperhidrosis: Secondary | ICD-10-CM | POA: Diagnosis not present

## 2017-08-01 DIAGNOSIS — I10 Essential (primary) hypertension: Secondary | ICD-10-CM

## 2017-08-01 DIAGNOSIS — Z8673 Personal history of transient ischemic attack (TIA), and cerebral infarction without residual deficits: Secondary | ICD-10-CM

## 2017-08-01 DIAGNOSIS — L74519 Primary focal hyperhidrosis, unspecified: Secondary | ICD-10-CM | POA: Insufficient documentation

## 2017-08-01 DIAGNOSIS — R55 Syncope and collapse: Secondary | ICD-10-CM | POA: Diagnosis not present

## 2017-08-01 DIAGNOSIS — L749 Eccrine sweat disorder, unspecified: Secondary | ICD-10-CM

## 2017-08-01 DIAGNOSIS — E119 Type 2 diabetes mellitus without complications: Secondary | ICD-10-CM

## 2017-08-01 DIAGNOSIS — E785 Hyperlipidemia, unspecified: Secondary | ICD-10-CM | POA: Diagnosis not present

## 2017-08-01 DIAGNOSIS — H5461 Unqualified visual loss, right eye, normal vision left eye: Secondary | ICD-10-CM

## 2017-08-01 DIAGNOSIS — D649 Anemia, unspecified: Secondary | ICD-10-CM

## 2017-08-01 DIAGNOSIS — Z87891 Personal history of nicotine dependence: Secondary | ICD-10-CM | POA: Diagnosis not present

## 2017-08-01 NOTE — Progress Notes (Signed)
Medicine attending: Medical history, presenting problems, physical findings, and medications, reviewed with resident physician Dr Colbert Ewing on the day of the patient visit and I concur with her evaluation and management plan. Recurrent idiopathic syncopal episodes. Eval unrevealing to date but suspicion that he may have an arrhythmia. Diabetic. Orthostatic decrease in BP without compensatory increase in pulse rate. Autonomic insufficiency in the differential. Total body tremors so neurodegenerative disorder a consideration. First priority will be Cardiology referral for event monitor or loop recorder.

## 2017-08-01 NOTE — Progress Notes (Signed)
   CC: syncopal episode  HPI: [name] is a 67 y.o. male with PMH significant for anemia, diabetes, HLD, HTN, and CVA in 09/2015 who presents with syncopal episode last week.  On 1/18, he was walking from the bus stop to the pharmacy when she started feeling dizzy, warm, and sweaty. He lost consciousness, and was caught by someone near him. He denies chest pain, shortness of breath, palpitations, or headache prior to the episode. He was told that he was out for about 10 seconds. He denies hitting his head or other trauma. He denies confusion or symptoms after the episode. He was seen in the emergency room on 1/18 and was noted to be orthostatic. He was given fluids with improvement in his BP and symptoms. EKG showed bifascicular block, unchanged to prior.  He was also admitted in 02/2017 for a similar syncopal episode thought to be secondary to dehydration and poor oral intake. During that episode, he states he was walking to the bus stop when he started feeling dizzy, warm, and sweaty and lost consciousness. HCTZ 25mg  was discontinued during that hospitalization.  TEE in 09/2015: LVEF 60-65%, no PFO, no valvular abnormalities Carotid ultrasound in 09/1015: 1-39% stenosis of right ICA and left ICA  Since the 18th, he denies dizziness or lightheadedness. He drinks about 2-3 bottles of water per day. He is a former smoker, denies alcohol use. He reports decreased appetite that is typical for him. Denies unintentional weight loss.  Since August 2018, he also notes profuse facial diaphoresis triggered by certain smells or foods. He denies night sweats, palpitations, shortness of breath, nausea, or abdominal pain during these episodes. Some triggers include smelling hot wings or Cookout and eating lima beans. He was previously able to tolerate all of the smells and foods without issue.  Past Medical History:  Diagnosis Date  . Anemia   . Dehydration   . Diabetes ([place])   . Glaucoma   . History  of urinary retention   . Hyperlipidemia   . Hypertension   . Stroke [facility])    Review of Systems:   GEN: Negative for fevers and night sweats. Positive for intermittent diaphoresis. NEURO: Positive for vision loss in right eye. Negative for dizziness or lightheadedness. CV: Negative for chest pain or palpitations PULM: Negative for SOB or cough  Physical Exam:  Vitals:   08/01/17 1037  BP: 134/71  Pulse: 83  Temp: 98.2 F (36.8 C)  TempSrc: Oral  SpO2: 100%  Weight: 162 lb 9.6 oz (73.8 kg)  Height: 6' (1.829 m)   GEN: Sitting in chair comfortably in NAD HEENT: Left eye round and reactive to light. Right eye non-reactive to light. Vision loss in right eye. MMM, no visible lesions CV: NR & RR, no m/r/g, no carotid bruits PULM: CTAB, no wheezes or rales MSK: No LE edema SKIN: Capillary refill ~2sec  Assessment & Plan:   See Encounters Tab for problem based charting.  Patient discussed with Dr. Beryle Beams

## 2017-08-01 NOTE — Assessment & Plan Note (Addendum)
Assessment August 2018, he notes profuse facial diaphoresis that is triggered by certain smells or foods. He denies night sweats, palpitations, shortness of breath, chest pain, nausea, or abdominal pain during his episodes. Some triggers include smelling hot wings or Cookout and eating lima beans. He was previously able to tolerate all of the smells and foods without issue.  Unclear etiology for his diaphoretic episodes. Differential includes autonomic neuropathy secondary to poorly controlled diabetes in the past vs idiopathic hyperhydrosis. Much less likely includes carcinoid syndrome or sympathetic dysfunction secondary to Pancoast tumor/other malignancy. CXR in 02/2017 was normal. At this point, I advised patient to avoid identifiable triggers. I discussed with the patient that there are certain medications that can be used for idiopathic hyperhydrosis, however they are associated with other side effects and with his recent syncopal episodes, I think this may cause more harm than benefit. I advised that if his symptoms worsen or become intolerable, we can discuss potential pharmacologic therapy at that point. Patient agreeable with this plan.  Plan - Advised patient to avoid identifiable triggers

## 2017-08-01 NOTE — Patient Instructions (Addendum)
FOLLOW-UP INSTRUCTIONS When: 08/26/2017 For: BP and diabetes management What to bring: medications   Dennis Macias,  It was a pleasure to meet you today.  For your episodes of passing out, please make sure to stay hydrated. We are also going to send a referral for cardiology to rule out a heart rhythm abnormality. They will call you to schedule an appointment.  You have an appointment with Dr. Beryle Beams on August 26, 2017. Please be sure to come to this appointment.

## 2017-08-01 NOTE — Assessment & Plan Note (Signed)
Assessment Patient seen in ER on 1/18 after syncopal episode, secondary to dehydration and poor oral intake. He endorses diaphoresis and dizziness prior to losing consciousness. Found to be orthostatic in the ED with clinical improvement after IV fluid resuscitation. No cardiac arrhythmias noted on telemetry. He did have one prior episode in August 2018 with the same symptoms. He drinks approximately 2-3 bottles of water per day.  Since discharge from the ER, patient reports that he has been doing well without episodes of dizziness or lightheadedness. He states he drinks approximately 2-3 bottles of water per day. A1c was most recently was 5.6. Our records go back to March 2017, where his A1c was 10.7. He is only on metformin 1000 mg twice a day for his diabetes. He reports that he has been a diabetic for about 15 years.  Differential includes orthostasis secondary to dehydration or autonomic dysfunction secondary to poorly controlled diabetes in the past. His cardiac exam is normal today, however given his recurrent syncopal episodes, I think he would benefit from cardiology referral and possible Holter monitor to rule out cardiac arrhythmias.  Orthostatics today: 163/80, HR 84 -> 133/71, HR 82 -> 125/64, HR 91  Plan - Patient educated on importance of continued PO hydration - Follow up on 08/26/2017 - Cardiology referral placed

## 2017-08-08 ENCOUNTER — Encounter: Payer: Self-pay | Admitting: Interventional Cardiology

## 2017-08-08 ENCOUNTER — Ambulatory Visit: Payer: Medicare PPO | Admitting: Interventional Cardiology

## 2017-08-08 VITALS — BP 110/52 | HR 89 | Ht 72.0 in | Wt 158.4 lb

## 2017-08-08 DIAGNOSIS — I10 Essential (primary) hypertension: Secondary | ICD-10-CM

## 2017-08-08 DIAGNOSIS — E1159 Type 2 diabetes mellitus with other circulatory complications: Secondary | ICD-10-CM | POA: Diagnosis not present

## 2017-08-08 DIAGNOSIS — R55 Syncope and collapse: Secondary | ICD-10-CM

## 2017-08-08 MED ORDER — AMLODIPINE BESYLATE 5 MG PO TABS: 5.0000 mg | ORAL_TABLET | Freq: Every day | ORAL | 0 refills | Status: DC

## 2017-08-08 MED FILL — AMLODIPINE BESYLATE 5 MG TA: 5 | 30 days supply | Qty: 30 | Fill #0

## 2017-08-08 NOTE — Progress Notes (Signed)
Cardiology Office Note   Date:  08/08/2017   ID:  Dennis Macias, DOB [birth year], MRN [id number]  PCP:  Jule Ser, DO    No chief complaint on file.  syncope  Wt Readings from Last 3 Encounters:  08/08/17 158 lb 6.4 oz (71.8 kg)  08/01/17 162 lb 9.6 oz (73.8 kg)  06/13/17 162 lb (73.5 kg)       History of Present Illness: Dennis Macias is a 67 y.o. male who is being seen today for the evaluation of syncope at the request of Colbert Ewing, MD.  In 8/18, he was walking and felt lightheaded, he did not sit down.  He eventually passed out for a short time.  He hit his head.  He went to the ER and was thought to be dehydrated.  His diuretic was stopped, presumably as the wife reports that a med was stopped.   The second episode was 2 weeks ago, while in line at [employer].  The nurse behind him caught him.    Diagnosed with HTN 2 years ago at the time of CVA.    Denies : Chest pain. Leg edema. Nitroglycerin use. Orthopnea. Palpitations. Paroxysmal nocturnal dyspnea. Shortness of breath.   No dizziness when standing up.   Sister had an AICD placed.  She passed away a year ago.    Past Medical History:  Diagnosis Date  . Anemia   . Dehydration   . Diabetes ([place])   . Glaucoma   . History of urinary retention   . Hyperlipidemia   . Hypertension   . Stroke [facility])     Past Surgical History:  Procedure Laterality Date  . TEE WITHOUT CARDIOVERSION N/A 09/14/2015   Procedure: TRANSESOPHAGEAL ECHOCARDIOGRAM (TEE);  Surgeon: Larey Dresser, MD;  Location: [facility] ENDOSCOPY;  Service: Cardiovascular;  Laterality: N/A;     Current Outpatient Medications  Medication Sig Dispense Refill  . acetaminophen (TYLENOL) 500 MG tablet Take 500 mg by mouth every 6 (six) hours as needed.    Marland Kitchen amLODipine (NORVASC) 10 MG tablet Take 1 tablet (10 mg total) by mouth daily. IM TEACHING SERVICE. Thanks. 30 tablet 1  . aspirin 81 MG tablet Take 1 tablet (81 mg total) by mouth daily. Internal  Medicine Program place on hold until patient requests to fill 90 tablet 3  . atorvastatin (LIPITOR) 80 MG tablet Take 1 tablet (80 mg total) by mouth at bedtime. IM program 90 tablet 3  . lisinopril (PRINIVIL,ZESTRIL) 40 MG tablet Take 1 tablet (40 mg total) by mouth daily. 30 tablet 11  . metFORMIN (GLUCOPHAGE) 1000 MG tablet Take 1 tablet (1,000 mg total) by mouth 2 (two) times daily with a meal. 60 tablet 11  . tamsulosin (FLOMAX) 0.4 MG CAPS capsule Take 0.4 mg by mouth at bedtime.   3   Current Facility-Administered Medications  Medication Dose Route Frequency Provider Last Rate Last Dose  . 0.9 %  sodium chloride infusion  500 mL Intravenous Continuous Ladene Artist, MD        Allergies:   Patient has no known allergies.    Social History:  The patient  reports that he has quit smoking. He quit smokeless tobacco use about 39 years ago. His smokeless tobacco use included chew. He reports that he drinks about 0.6 oz of alcohol per week. He reports that he does not use drugs.   Family History:  The patient's family history includes Hyperlipidemia in his father and mother; Hypertension in his  mother.    ROS:  Please see the history of present illness.   Otherwise, review of systems are positive for syncope.   All other systems are reviewed and negative.    PHYSICAL EXAM: VS:  BP (!) 110/52   Pulse 89   Ht 6' (1.829 m)   Wt 158 lb 6.4 oz (71.8 kg)   SpO2 99%   BMI 21.48 kg/m  , BMI Body mass index is 21.48 kg/m. GEN: Well nourished, well developed, in no acute distress  HEENT: normal  Neck: no JVD, carotid bruits, or masses Cardiac: RRR; no murmurs, rubs, or gallops,no edema  Respiratory:  clear to auscultation bilaterally, normal work of breathing GI: soft, nontender, nondistended, + BS MS: no deformity or atrophy  Skin: warm and dry, no rash Neuro:  Strength and sensation are intact Psych: euthymic mood, full affect   EKG:   The ekg ordered today demonstrates NSR,  bifascicular block, possible LVH,    Recent Labs: 02/22/2017: Magnesium 1.0 07/26/2017: BUN 13; Creatinine, Ser 1.20; Hemoglobin 10.6; Platelets 349; Potassium 4.0; Sodium 139   Lipid Panel    Component Value Date/Time   CHOL 234 (H) 09/13/2015 0546   TRIG 183 (H) 09/13/2015 0546   HDL 49 09/13/2015 0546   CHOLHDL 4.8 09/13/2015 0546   VLDL 37 09/13/2015 0546   LDLCALC 148 (H) 09/13/2015 0546     Other studies Reviewed: Additional studies/ records that were reviewed today with results demonstrating: 2017 echo showed normal LV function, normal valvular function.   ASSESSMENT AND PLAN:  1. Syncope: Likely due to dehydration and BP lowering meds. Decrease amlodipine to 5 mg daily.  Stay well hydrated.  No structural heart disease by 2017 echo, or by exam today. 2. HTN: COntinue lisinopril.  Decrease meds if BP stays low.   3. DM: CONtinue metformin.  Managed by PMD.    Current medicines are reviewed at length with the patient today.  The patient concerns regarding his medicines were addressed.  The following changes have been made:  No change  Labs/ tests ordered today include:  No orders of the defined types were placed in this encounter.   Recommend 150 minutes/week of aerobic exercise Low fat, low carb, high fiber diet recommended  Disposition:   FU as needed   Signed, Larae Grooms, MD  08/08/2017 4:06 PM    [facility] [place], [place], [place]  [address] Phone: [phone]; Fax: [phone]

## 2017-08-08 NOTE — Patient Instructions (Signed)
Medication Instructions:  Your physician has recommended you make the following change in your medication:   DECREASE: amlodipine to 5 mg daily  Labwork: None ordered  Testing/Procedures: None ordered  Follow-Up: Your physician wants you to follow-up with your Primary Care Doctor   Any Other Special Instructions Will Be Listed Below (If Applicable).  Stay hydrated   If you need a refill on your cardiac medications before your next appointment, please call your pharmacy.

## 2017-08-12 MED FILL — TAMSULOSIN HCL 0.4 MG CAP: 0.4 | 30 days supply | Qty: 30 | Fill #2

## 2017-08-12 MED FILL — ATORVASTATIN 80 MG TABLET: 80 | 30 days supply | Qty: 30 | Fill #5

## 2017-08-26 ENCOUNTER — Ambulatory Visit: Payer: Medicare PPO | Admitting: Oncology

## 2017-08-26 ENCOUNTER — Encounter: Payer: Self-pay | Admitting: Oncology

## 2017-08-26 ENCOUNTER — Other Ambulatory Visit: Payer: Self-pay

## 2017-08-26 VITALS — BP 123/74 | HR 112 | Temp 98.2°F | Ht 72.0 in | Wt 157.6 lb

## 2017-08-26 DIAGNOSIS — D649 Anemia, unspecified: Secondary | ICD-10-CM

## 2017-08-26 DIAGNOSIS — R000 Tachycardia, unspecified: Secondary | ICD-10-CM

## 2017-08-26 DIAGNOSIS — Z6821 Body mass index (BMI) 21.0-21.9, adult: Secondary | ICD-10-CM

## 2017-08-26 DIAGNOSIS — Z8719 Personal history of other diseases of the digestive system: Secondary | ICD-10-CM

## 2017-08-26 DIAGNOSIS — R634 Abnormal weight loss: Secondary | ICD-10-CM | POA: Diagnosis not present

## 2017-08-26 DIAGNOSIS — I452 Bifascicular block: Secondary | ICD-10-CM | POA: Diagnosis not present

## 2017-08-26 DIAGNOSIS — Z8673 Personal history of transient ischemic attack (TIA), and cerebral infarction without residual deficits: Secondary | ICD-10-CM

## 2017-08-26 DIAGNOSIS — Z7982 Long term (current) use of aspirin: Secondary | ICD-10-CM

## 2017-08-26 DIAGNOSIS — I4510 Unspecified right bundle-branch block: Secondary | ICD-10-CM | POA: Diagnosis not present

## 2017-08-26 DIAGNOSIS — Z87448 Personal history of other diseases of urinary system: Secondary | ICD-10-CM

## 2017-08-26 DIAGNOSIS — E1142 Type 2 diabetes mellitus with diabetic polyneuropathy: Secondary | ICD-10-CM

## 2017-08-26 DIAGNOSIS — Z7984 Long term (current) use of oral hypoglycemic drugs: Secondary | ICD-10-CM

## 2017-08-26 DIAGNOSIS — E11319 Type 2 diabetes mellitus with unspecified diabetic retinopathy without macular edema: Secondary | ICD-10-CM | POA: Diagnosis not present

## 2017-08-26 DIAGNOSIS — R630 Anorexia: Secondary | ICD-10-CM

## 2017-08-26 DIAGNOSIS — Z79899 Other long term (current) drug therapy: Secondary | ICD-10-CM

## 2017-08-26 DIAGNOSIS — Z87891 Personal history of nicotine dependence: Secondary | ICD-10-CM

## 2017-08-26 DIAGNOSIS — Z86010 Personal history of colonic polyps: Secondary | ICD-10-CM

## 2017-08-26 HISTORY — DX: Tachycardia, unspecified: R00.0

## 2017-08-26 HISTORY — DX: Abnormal weight loss: R63.4

## 2017-08-26 LAB — SAVE SMEAR

## 2017-08-26 MED FILL — LISINOPRIL 40 MG TABLET: 40 | 30 days supply | Qty: 30 | Fill #5

## 2017-08-26 MED FILL — metFORMIN HCL 1000 MG TABS: 1000 | 30 days supply | Qty: 60 | Fill #5

## 2017-08-26 NOTE — Patient Instructions (Signed)
To lab today I will call you if we need to schedule a bone marrow biopsy MD visit 1 month 09/23/17

## 2017-08-26 NOTE — Progress Notes (Signed)
New Patient Hematology   Dennis Macias [id number] 07/15/1950 67 y.o. 08/26/2017  CC: Dr. Jule Ser   Reason for referral: Unexplained normochromic anemia   HPI:  67 year old man with hypertension, type 2 diabetes on Glucophage, retinopathy and neuropathy probably related to the diabetes.  He suffered a mild stroke in March 2017.  MRI showed 2 small acute infarcts in the right frontal lobe and a small acute infarct in the left frontal lobe.  He has had a number of admissions for recurrent idiopathic syncope.  Cardiogram shows a right bundle branch block and bifascicular block but to date no malignant arrhythmias have been detected.  He was recently admitted again on January 18.  He had orthostatic decrease in his blood pressure without compensatory increase in his pulse.  No arrhythmias detected on cardiac monitor.  However we felt that he would benefit from a 30-day event monitor.  He did see Dr.Varnasi,  Cardiology, after discharge.  He felt that his syncope was combined effects of dehydration and antihypertensive meds.  He decreased his amlodipine to 5 mg daily.  He was first noted to be anemic on October 03, 2016 when hemoglobin was recorded at 10.5 with MCV 85.  This was a dramatic change from his previous CBC recorded in March 2017 when hemoglobin was 15.8.  A number of laboratory studies have been done since that time.  In June 2018 B12 level [id number]), iron 89, percent saturation 43, TIBC 208, ferritin 412.  Methylmalonic acid elevated at 573 (0-378) with red cell folate normal at 894 done August 2018.  Reticulocyte count 1.4%.  In October 2018 he was supposed to see me.  Labs done in advance of the visit showed a normal serum protein electrophoresis with no M spike, normal quantitative immunoglobulins, no monoclonal proteins on immunofixation electrophoresis, and a normal serum free light chain ratio of 1.16.  Most recent labs done July 26, 2017 show hemoglobin stable but remains  decreased at 10.6, MCV 90.5, BUN 13, creatinine 1.2.  Urine analysis negative for hemoglobin, bilirubin, and red cells 0-5 per high-power field which is normal. Last recorded liver functions from March 2017 normal except for decreased albumin 3.2 and total protein 6.4.  Total bilirubin normal at 1.0.  He had a colonoscopy March 06, 2017.  2 tubular adenomas removed from the ascending colon and 2 from the rectum.  He has never had upper endoscopy but denies any GI symptoms other than anorexia and an approximate 10 pound weight loss with base weight 167 pounds recorded in June 2018 and weight today 158 pounds.  He denies any dysphagia.  No history of peptic ulcer disease.  HIV screen negative in March 2017.  Review of systems other than the weight loss positive for excessive sweating during the day usually when he smells food or eats something spicy.  He denied any profuse night sweats.  He denied any paresthesias. He has had problems with urinary retention.  He was on Flomax.  This was stopped due to the possible effect on his blood pressure with the syncopal episodes but he developed urinary retention again and went back on the medication.  He worked at [employer] for over 30 years and had significant exposure to cotton dust.  A chest radiograph done in June 2018 was normal.  He is a never smoker. He used to drink about a sixpack of beer a day while he was working but he no longer drinks at all.  No toxic exposures that he is  aware of.  He did not work with any of the textile dyes.  There is no family history of any known blood disorder.  No sickle cell disease.  He is 1 of 10 children.  5 brothers one who had a stroke but currently alive with some residual deficits.  4 sisters.  One died of a heart attack at age 44.  Both parents lived into their early 44s and died of heart conditions.  He has a son and 2 daughters who are healthy.   PMH: Past Medical History:  Diagnosis Date  . Anemia   .  Dehydration   . Diabetes ([place])   . Glaucoma   . History of urinary retention   . Hyperlipidemia   . Hypertension   . Stroke ([place])   . Tachycardia 08/26/2017  . Weight loss, non-intentional 08/26/2017   10 lbs between 6/18 & 2/19    Past Surgical History:  Procedure Laterality Date  . TEE WITHOUT CARDIOVERSION N/A 09/14/2015   Procedure: TRANSESOPHAGEAL ECHOCARDIOGRAM (TEE);  Surgeon: Larey Dresser, MD;  Location: [facility] ENDOSCOPY;  Service: Cardiovascular;  Laterality: N/A;    Allergies: No Known Allergies  Medications:  Current Outpatient Medications:  .  acetaminophen (TYLENOL) 500 MG tablet, Take 500 mg by mouth every 6 (six) hours as needed., Disp: , Rfl:  .  amLODipine (NORVASC) 5 MG tablet, Take 1 tablet (5 mg total) by mouth daily., Disp: 30 tablet, Rfl: 0 .  aspirin 81 MG tablet, Take 1 tablet (81 mg total) by mouth daily. Internal Medicine Program place on hold until patient requests to fill, Disp: 90 tablet, Rfl: 3 .  atorvastatin (LIPITOR) 80 MG tablet, Take 1 tablet (80 mg total) by mouth at bedtime. IM program, Disp: 90 tablet, Rfl: 3 .  lisinopril (PRINIVIL,ZESTRIL) 40 MG tablet, Take 1 tablet (40 mg total) by mouth daily., Disp: 30 tablet, Rfl: 11 .  metFORMIN (GLUCOPHAGE) 1000 MG tablet, Take 1 tablet (1,000 mg total) by mouth 2 (two) times daily with a meal., Disp: 60 tablet, Rfl: 11 .  tamsulosin (FLOMAX) 0.4 MG CAPS capsule, Take 0.4 mg by mouth at bedtime. , Disp: , Rfl: 3 .   Social History: Married and wife accompanies him today. "Reports that he has quit smoking".  He told me he was a never smoker.  He quit smokeless tobacco use about 39 years ago. His smokeless tobacco use included chew.  He used to drink beer none in over 2 years since his stroke.  Family History: Family History  Problem Relation Age of Onset  . Hypertension Mother   . Hyperlipidemia Mother   . Hyperlipidemia Father   . Colon cancer Neg Hx   . Colon polyps Neg Hx   . Esophageal cancer  Neg Hx   . Rectal cancer Neg Hx   . Stomach cancer Neg Hx     Review of Systems: See HPI. No headache.  No change in vision. He denies any polyarthralgias, no bone pain. Remaining ROS negative.  Physical Exam: Blood pressure 123/74, pulse (!) 112, temperature 98.2 F (36.8 C), temperature source Oral, height 6' (1.829 m), weight 157 lb 9.6 oz (71.5 kg), SpO2 100 %. Wt Readings from Last 3 Encounters:  08/26/17 157 lb 9.6 oz (71.5 kg)  08/08/17 158 lb 6.4 oz (71.8 kg)  08/01/17 162 lb 9.6 oz (73.8 kg)     General appearance: Thin but adequately nourished African-American man HENNT: Pinguecula left eye. Pharynx no erythema, exudate, mass, or ulcer. No  thyromegaly or thyroid nodules Lymph nodes: No cervical, supraclavicular, or axillary lymphadenopathy Breasts:  Lungs: Clear to auscultation, resonant to percussion throughout Heart: Regular rhythm, no murmur, no gallop, no rub, no click, no edema Abdomen: Soft, nontender, normal bowel sounds, no mass, no organomegaly Extremities: No edema, no calf tenderness Musculoskeletal: no joint deformities GU:  Vascular: Carotid pulses 1+, no bruits, distal pulses: Dorsalis pedis 1+ left foot, nonpalpable right foot. Neurologic: Alert, oriented, PERRLA, optic discs sharp and vessels normal, no hemorrhage or exudate, cranial nerves grossly normal except he is hard of hearing, motor strength 5 over 5, reflexes 1+ symmetric, upper body coordination normal, gait normal, almost absent vibration sense over the fingertips and absent vibration sense over the feet.  Minimal sensation on bone conduction at the ankle. Skin: No rash or ecchymosis    Lab Results: Lab Results  Component Value Date   WBC 8.0 07/26/2017   HGB 10.6 (L) 07/26/2017   HCT 31.3 (L) 07/26/2017   MCV 90.5 07/26/2017   PLT 349 07/26/2017     Chemistry      Component Value Date/Time   NA 139 07/26/2017 2149   NA 139 02/26/2017 0942   K 4.0 07/26/2017 2149   CL 105  07/26/2017 2149   CO2 23 07/26/2017 2149   BUN 13 07/26/2017 2149   BUN 9 02/26/2017 0942   CREATININE 1.20 07/26/2017 2149      Component Value Date/Time   CALCIUM 9.1 07/26/2017 2149   ALKPHOS 66 09/12/2015 1347   AST 13 (L) 09/12/2015 1347   ALT 10 (L) 09/12/2015 1347   BILITOT 1.0 09/12/2015 1347       Review of peripheral blood film: Normochromic normocytic red cells.  2+ Echinocytes; no red cell inclusions.  No basophilic stippling.  Neutrophils appear normal in lobation and granular lymphocytes mature with an occasional benign reactive lymphocyte.  Platelets appear normal in number and morphology.  Radiological Studies: Chest x-ray normal June 2018 Echocardiogram with bubble study normal March 2017   Impression: Rather abrupt fall in hemoglobin over the last year.  Hemoglobin has stabilized at 10 g. Myeloma screen negative.  No signs of a hemolytic process.  Normal B12 but elevated methylmalonic acid recorded in August 2018.  Iron studies show a chronic disease pattern.  He has had an approximate 10 pound weight loss over the last year.  No obvious physical findings pointing to a malignancy and he had a normal colonoscopy in August 2018 done subsequent to the fall in his hemoglobin and a normal chest x-ray. Exam most remarkable for resting tachycardia and severe decrease in vibration sensation in a glove stocking distribution. Review of the peripheral blood with multiple echinocytes which are nonspecific.   Recommendation: With respect to the tachycardia, anorexia, and weight loss, I am going to screen him for thyroid disease. With respect to the "anemia of chronic disease" I will get a screening sedimentation rate.  He does not have any signs or symptoms of a collagen vascular disorder.  No obvious signs of malignancy although this remains in the differential.  I am going to get a screening PSA in view of his symptoms of intermittent urinary retention. With respect to his  significant peripheral neuropathy, recurrent syncope, atypical pattern for diabetes, no obvious toxic exposures, only moderate alcohol use in the past, I do not have a good explanation.  He appears to have autonomic insufficiency.  I do not think it would hurt to put him on oral B12 supplementation in  view of the elevated MMA.  Consider getting nerve conduction studies.  I discussed with him and his wife that if the above screening studies are unremarkable then we need to do a bone marrow aspiration and biopsy to see if we find any pathology in the bone marrow that might explain his anemia such as a myelodysplastic syndrome.    Murriel Hopper, MD, [facility]  Hematology-Oncology/Internal Medicine  08/26/2017, 6:54 PM

## 2017-08-27 LAB — CBC WITH DIFFERENTIAL/PLATELET
Basophils Absolute: 0 10*3/uL (ref 0.0–0.2)
Basos: 0 %
EOS (ABSOLUTE): 0 10*3/uL (ref 0.0–0.4)
Eos: 0 %
Hematocrit: 30.7 % — ABNORMAL LOW (ref 37.5–51.0)
Hemoglobin: 10.5 g/dL — ABNORMAL LOW (ref 13.0–17.7)
Immature Grans (Abs): 0 10*3/uL (ref 0.0–0.1)
Immature Granulocytes: 0 %
Lymphocytes Absolute: 2.2 10*3/uL (ref 0.7–3.1)
Lymphs: 29 %
MCH: 31 pg (ref 26.6–33.0)
MCHC: 34.2 g/dL (ref 31.5–35.7)
MCV: 91 fL (ref 79–97)
Monocytes Absolute: 0.4 10*3/uL (ref 0.1–0.9)
Monocytes: 5 %
Neutrophils Absolute: 5 10*3/uL (ref 1.4–7.0)
Neutrophils: 66 %
Platelets: 389 10*3/uL — ABNORMAL HIGH (ref 150–379)
RBC: 3.39 x10E6/uL — ABNORMAL LOW (ref 4.14–5.80)
RDW: 15 % (ref 12.3–15.4)
WBC: 7.5 10*3/uL (ref 3.4–10.8)

## 2017-08-27 LAB — PSA: Prostate Specific Ag, Serum: 5.3 ng/mL — ABNORMAL HIGH (ref 0.0–4.0)

## 2017-08-27 LAB — T4, FREE: Free T4: 1.12 ng/dL (ref 0.82–1.77)

## 2017-08-27 LAB — TSH: TSH: 2.92 u[IU]/mL (ref 0.450–4.500)

## 2017-08-27 LAB — LACTATE DEHYDROGENASE: LDH: 199 IU/L (ref 121–224)

## 2017-08-27 LAB — RETICULOCYTES: Retic Ct Pct: 2.1 % (ref 0.6–2.6)

## 2017-08-27 LAB — SEDIMENTATION RATE: Sed Rate: 2 mm/hr (ref 0–30)

## 2017-09-04 ENCOUNTER — Other Ambulatory Visit: Payer: Self-pay | Admitting: Oncology

## 2017-09-04 DIAGNOSIS — D649 Anemia, unspecified: Secondary | ICD-10-CM

## 2017-09-04 DIAGNOSIS — R634 Abnormal weight loss: Secondary | ICD-10-CM

## 2017-09-05 ENCOUNTER — Other Ambulatory Visit: Payer: Self-pay | Admitting: Oncology

## 2017-09-05 DIAGNOSIS — D649 Anemia, unspecified: Secondary | ICD-10-CM

## 2017-09-11 DIAGNOSIS — Z125 Encounter for screening for malignant neoplasm of prostate: Secondary | ICD-10-CM | POA: Diagnosis not present

## 2017-09-12 ENCOUNTER — Other Ambulatory Visit: Payer: Self-pay | Admitting: Interventional Cardiology

## 2017-09-12 MED FILL — AMLODIPINE BESYLATE 5 MG TA: 5 | 30 days supply | Qty: 30 | Fill #0

## 2017-09-16 ENCOUNTER — Other Ambulatory Visit: Payer: Self-pay | Admitting: Radiology

## 2017-09-16 ENCOUNTER — Other Ambulatory Visit: Payer: Self-pay | Admitting: Interventional Cardiology

## 2017-09-16 ENCOUNTER — Other Ambulatory Visit: Payer: Self-pay | Admitting: *Deleted

## 2017-09-16 DIAGNOSIS — E11319 Type 2 diabetes mellitus with unspecified diabetic retinopathy without macular edema: Secondary | ICD-10-CM

## 2017-09-16 DIAGNOSIS — E785 Hyperlipidemia, unspecified: Secondary | ICD-10-CM

## 2017-09-16 MED ORDER — LISINOPRIL 40 MG PO TABS: 40.0000 mg | ORAL_TABLET | Freq: Every day | ORAL | 11 refills | Status: DC

## 2017-09-16 MED ORDER — AMLODIPINE BESYLATE 5 MG PO TABS: 5.0000 mg | ORAL_TABLET | Freq: Every day | ORAL | 2 refills | Status: DC

## 2017-09-16 MED ORDER — METFORMIN HCL 1000 MG PO TABS: 1000.0000 mg | ORAL_TABLET | Freq: Two times a day (BID) | ORAL | 11 refills | Status: DC

## 2017-09-16 MED ORDER — ATORVASTATIN CALCIUM 80 MG PO TABS: 80.0000 mg | ORAL_TABLET | Freq: Every day | ORAL | 11 refills | Status: DC

## 2017-09-16 NOTE — Telephone Encounter (Signed)
Pt now using [facility]-will send refill request to pcp.Regenia Skeeter, Lilyrose Tanney Cassady3/11/201911:05 AM

## 2017-09-18 DIAGNOSIS — N401 Enlarged prostate with lower urinary tract symptoms: Secondary | ICD-10-CM | POA: Diagnosis not present

## 2017-09-18 DIAGNOSIS — R351 Nocturia: Secondary | ICD-10-CM | POA: Diagnosis not present

## 2017-09-18 DIAGNOSIS — R972 Elevated prostate specific antigen [PSA]: Secondary | ICD-10-CM | POA: Diagnosis not present

## 2017-09-19 ENCOUNTER — Encounter (HOSPITAL_COMMUNITY): Payer: Self-pay

## 2017-09-19 ENCOUNTER — Ambulatory Visit (HOSPITAL_COMMUNITY)
Admission: RE | Admit: 2017-09-19 | Discharge: 2017-09-19 | Disposition: A | Discharge status: Home / Self Care | Payer: Medicare PPO | Source: Ambulatory Visit | Attending: Oncology | Admitting: Oncology

## 2017-09-19 DIAGNOSIS — D7589 Other specified diseases of blood and blood-forming organs: Secondary | ICD-10-CM | POA: Insufficient documentation

## 2017-09-19 DIAGNOSIS — I10 Essential (primary) hypertension: Secondary | ICD-10-CM | POA: Diagnosis not present

## 2017-09-19 DIAGNOSIS — Z8673 Personal history of transient ischemic attack (TIA), and cerebral infarction without residual deficits: Secondary | ICD-10-CM | POA: Diagnosis not present

## 2017-09-19 DIAGNOSIS — Z87891 Personal history of nicotine dependence: Secondary | ICD-10-CM | POA: Diagnosis not present

## 2017-09-19 DIAGNOSIS — E785 Hyperlipidemia, unspecified: Secondary | ICD-10-CM | POA: Diagnosis not present

## 2017-09-19 DIAGNOSIS — Z7984 Long term (current) use of oral hypoglycemic drugs: Secondary | ICD-10-CM | POA: Insufficient documentation

## 2017-09-19 DIAGNOSIS — D649 Anemia, unspecified: Secondary | ICD-10-CM | POA: Diagnosis not present

## 2017-09-19 DIAGNOSIS — E119 Type 2 diabetes mellitus without complications: Secondary | ICD-10-CM | POA: Insufficient documentation

## 2017-09-19 DIAGNOSIS — R634 Abnormal weight loss: Secondary | ICD-10-CM

## 2017-09-19 LAB — CBC WITH DIFFERENTIAL/PLATELET
Basophils Absolute: 0 10*3/uL (ref 0.0–0.1)
Basophils Relative: 0 %
Eosinophils Absolute: 0.1 10*3/uL (ref 0.0–0.7)
Eosinophils Relative: 1 %
HCT: 31.1 % — ABNORMAL LOW (ref 39.0–52.0)
Hemoglobin: 10.6 g/dL — ABNORMAL LOW (ref 13.0–17.0)
Lymphocytes Relative: 29 %
Lymphs Abs: 2.7 10*3/uL (ref 0.7–4.0)
MCH: 30.9 pg (ref 26.0–34.0)
MCHC: 34.1 g/dL (ref 30.0–36.0)
MCV: 90.7 fL (ref 78.0–100.0)
Monocytes Absolute: 0.4 10*3/uL (ref 0.1–1.0)
Monocytes Relative: 4 %
Neutro Abs: 6.3 10*3/uL (ref 1.7–7.7)
Neutrophils Relative %: 66 %
Platelets: 337 10*3/uL (ref 150–400)
RBC: 3.43 MIL/uL — ABNORMAL LOW (ref 4.22–5.81)
RDW: 13.2 % (ref 11.5–15.5)
WBC: 9.5 10*3/uL (ref 4.0–10.5)

## 2017-09-19 LAB — GLUCOSE, CAPILLARY: Glucose-Capillary: 145 mg/dL — ABNORMAL HIGH (ref 65–99)

## 2017-09-19 LAB — PROTIME-INR
INR: 1.27
Prothrombin Time: 15.8 seconds — ABNORMAL HIGH (ref 11.4–15.2)

## 2017-09-19 MED ORDER — FENTANYL CITRATE (PF) 100 MCG/2ML IJ SOLN
INTRAMUSCULAR | Status: AC
  Filled 2017-09-19: qty 2

## 2017-09-19 MED ORDER — LIDOCAINE HCL (PF) 1 % IJ SOLN
INTRAMUSCULAR | Status: AC | PRN
  Administered 2017-09-19: 10 mL

## 2017-09-19 MED ORDER — FENTANYL CITRATE (PF) 100 MCG/2ML IJ SOLN
INTRAMUSCULAR | Status: AC | PRN
  Administered 2017-09-19 (×2): 50 ug via INTRAVENOUS

## 2017-09-19 MED ORDER — MIDAZOLAM HCL 2 MG/2ML IJ SOLN
INTRAMUSCULAR | Status: AC | PRN
  Administered 2017-09-19 (×2): 1 mg via INTRAVENOUS

## 2017-09-19 MED ORDER — SODIUM CHLORIDE 0.9 % IV SOLN
INTRAVENOUS | Status: DC
  Administered 2017-09-19: 07:00:00 via INTRAVENOUS

## 2017-09-19 MED ORDER — MIDAZOLAM HCL 2 MG/2ML IJ SOLN
INTRAMUSCULAR | Status: AC
  Filled 2017-09-19: qty 4

## 2017-09-19 NOTE — Procedures (Signed)
Pre-procedure Diagnosis: Anemia Post-procedure Diagnosis: Same  Technically successful CT guided bone marrow aspiration and biopsy of left iliac crest.   Complications: None Immediate  EBL: None  SignedSandi Mariscal Pager: [phone] 09/19/2017, 9:18 AM

## 2017-09-19 NOTE — Consult Note (Signed)
Chief Complaint: Patient was seen in consultation today for CT-guided bone marrow biopsy  Referring Physician(s): [place] M  Supervising Physician: Sandi Mariscal  Patient Status: Dennis Macias - Out-pt  History of Present Illness: Girard Koontz is a 67 y.o. male with history of normochromic anemia of unknown etiology who presents today for CT-guided bone marrow biopsy for further evaluation.  Past Medical History:  Diagnosis Date  . Anemia   . Dehydration   . Diabetes ([place])   . Glaucoma   . History of urinary retention   . Hyperlipidemia   . Hypertension   . Stroke ([place])   . Tachycardia 08/26/2017  . Weight loss, non-intentional 08/26/2017   10 lbs between 6/18 & 2/19    Past Surgical History:  Procedure Laterality Date  . TEE WITHOUT CARDIOVERSION N/A 09/14/2015   Procedure: TRANSESOPHAGEAL ECHOCARDIOGRAM (TEE);  Surgeon: Larey Dresser, MD;  [facility];  Service: Cardiovascular;  Laterality: N/A;    Allergies: Patient has no known allergies.  Medications: Prior to Admission medications   Medication Sig Start Date End Date Taking? Authorizing Provider  amLODipine (NORVASC) 5 MG tablet Take 1 tablet (5 mg total) by mouth daily. 09/16/17  Yes Jettie Booze, MD  aspirin 81 MG tablet Take 1 tablet (81 mg total) by mouth daily. Internal Medicine Program place on hold until patient requests to fill 01/27/16  Yes Jule Ser, DO  atorvastatin (LIPITOR) 80 MG tablet Take 1 tablet (80 mg total) by mouth at bedtime. IM program 09/16/17  Yes Jule Ser, DO  lisinopril (PRINIVIL,ZESTRIL) 40 MG tablet Take 1 tablet (40 mg total) by mouth daily. 09/16/17 09/16/18 Yes Jule Ser, DO  metFORMIN (GLUCOPHAGE) 1000 MG tablet Take 1 tablet (1,000 mg total) by mouth 2 (two) times daily with a meal. 09/16/17  Yes Jule Ser, DO  tamsulosin (FLOMAX) 0.4 MG CAPS capsule Take 0.4 mg by mouth at bedtime.  05/09/17  Yes [provider]  acetaminophen  (TYLENOL) 500 MG tablet Take 500 mg by mouth every 6 (six) hours as needed.    [provider]     Family History  Problem Relation Age of Onset  . Hypertension Mother   . Hyperlipidemia Mother   . Hyperlipidemia Father   . Colon cancer Neg Hx   . Colon polyps Neg Hx   . Esophageal cancer Neg Hx   . Rectal cancer Neg Hx   . Stomach cancer Neg Hx     Social History   Socioeconomic History  . Marital status: Married    Spouse name: None  . Number of children: None  . Years of education: None  . Highest education level: None  Social Needs  . Financial resource strain: None  . Food insecurity - worry: None  . Food insecurity - inability: None  . Transportation needs - medical: None  . Transportation needs - non-medical: None  Occupational History  . None  Tobacco Use  . Smoking status: Former Research scientist (life sciences)  . Smokeless tobacco: Former Systems developer    Types: Chew    Quit date: 07/09/1978  . Tobacco comment: quit 1 year ago  Substance and Sexual Activity  . Alcohol use: No    Alcohol/week: 0.6 oz    Types: 1 Cans of beer per week    Frequency: Never    Comment: quit last march/2017  . Drug use: No  . Sexual activity: None  Other Topics Concern  . None  Social History Narrative  . None  Review of Systems denies fever, headache, chest pain, dyspnea, cough, abdominal/back pain, nausea, vomiting or bleeding.  Vital Signs: Blood pressure 121/80, heart rate 87, respirations 18, O2 sat 97% room air, temp 99.2   Physical Exam awake, alert.  Chest clear to auscultation bilaterally.  Heart with regular rate and rhythm.  Abdomen soft, positive bowel sounds, nontender.  No lower extremity edema  Imaging: No results found.  Labs:  CBC: Recent Labs    04/11/17 0908 07/26/17 2149 08/26/17 1450 09/19/17 0730  WBC 8.2 8.0 7.5 9.5  HGB 9.9* 10.6* 10.5* 10.6*  HCT 28.8* 31.3* 30.7* 31.1*  PLT 327 349 389* 337    COAGS: Recent Labs    09/19/17 0730  INR 1.27     BMP: Recent Labs    02/21/17 1054 02/22/17 0447 02/26/17 0942 07/26/17 2149  NA 140 139 139 139  K 3.7 3.0* 3.9 4.0  CL 105 104 102 105  CO2 23 26 22 23   GLUCOSE 89 128* 181* 161*  BUN 17 13 9 13   CALCIUM 9.1 8.9 9.3 9.1  CREATININE 1.34* 1.08 0.97 1.20  GFRNONAA 54* >60 82 >60  GFRAA >60 >60 94 >60    LIVER FUNCTION TESTS: Recent Labs    04/11/17 0908  PROT 5.6*    TUMOR MARKERS: No results for input(s): AFPTM, CEA, CA199, CHROMGRNA in the last 8760 hours.  Assessment and Plan:  67 y.o. male with history of normochromic anemia of unknown etiology who presents today for CT-guided bone marrow biopsy for further evaluation.Risks and benefits discussed with the patient/family including, but not limited to bleeding, infection, damage to adjacent structures or low yield requiring additional tests.  All of the patient's questions were answered, patient is agreeable to proceed. Consent signed and in chart.     Thank you for this interesting consult.  I greatly enjoyed meeting Raevon Broom and look forward to participating in their care.  A copy of this report was sent to the requesting provider on this date.  Electronically Signed: D. Rowe Robert, PA-C 09/19/2017, 8:28 AM   I spent a total of 20 minutes    in face to face in clinical consultation, greater than 50% of which was counseling/coordinating care for CT-guided bone marrow biopsy

## 2017-09-19 NOTE — Discharge Instructions (Signed)
Bone Marrow Aspiration and Bone Marrow Biopsy, Adult, Care After °This sheet gives you information about how to care for yourself after your procedure. Your health care provider may also give you more specific instructions. If you have problems or questions, contact your health care provider. °What can I expect after the procedure? °After the procedure, it is common to have: °· Mild pain and tenderness. °· Swelling. °· Bruising. ° °Follow these instructions at home: °· Take over-the-counter or prescription medicines only as told by your health care provider. °· Do not take baths, swim, or use a hot tub until your health care provider approves. Ask if you can take a shower or have a sponge bath. °· Follow instructions from your health care provider about how to take care of the puncture site. Make sure you: °? Wash your hands with soap and water before you change your bandage (dressing). If soap and water are not available, use hand sanitizer. °? Change your dressing as told by your health care provider. °· Check your puncture site every day for signs of infection. Check for: °? More redness, swelling, or pain. °? More fluid or blood. °? Warmth. °? Pus or a bad smell. °· Return to your normal activities as told by your health care provider. Ask your health care provider what activities are safe for you. °· Do not drive for 24 hours if you were given a medicine to help you relax (sedative). °· Keep all follow-up visits as told by your health care provider. This is important. °Contact a health care provider if: °· You have more redness, swelling, or pain around the puncture site. °· You have more fluid or blood coming from the puncture site. °· Your puncture site feels warm to the touch. °· You have pus or a bad smell coming from the puncture site. °· You have a fever. °· Your pain is not controlled with medicine. °This information is not intended to replace advice given to you by your health care provider. Make sure  you discuss any questions you have with your health care provider. °Document Released: 01/12/2005 Document Revised: 01/13/2016 Document Reviewed: 12/07/2015 °Elsevier Interactive Patient Education © 2018 Elsevier [employer]. °Moderate Conscious Sedation, Adult, Care After °These instructions provide you with information about caring for yourself after your procedure. Your health care provider may also give you more specific instructions. Your treatment has been planned according to current medical practices, but problems sometimes occur. Call your health care provider if you have any problems or questions after your procedure. °What can I expect after the procedure? °After your procedure, it is common: °· To feel sleepy for several hours. °· To feel clumsy and have poor balance for several hours. °· To have poor judgment for several hours. °· To vomit if you eat too soon. ° °Follow these instructions at home: °For at least 24 hours after the procedure: ° °· Do not: °? Participate in activities where you could fall or become injured. °? Drive. °? Use heavy machinery. °? Drink alcohol. °? Take sleeping pills or medicines that cause drowsiness. °? Make important decisions or sign legal documents. °? Take care of children on your own. °· Rest. °Eating and drinking °· Follow the diet recommended by your health care provider. °· If you vomit: °? Drink water, juice, or soup when you can drink without vomiting. °? Make sure you have little or no nausea before eating solid foods. °General instructions °· Have a responsible adult stay with you until you are   awake and alert.  Take over-the-counter and prescription medicines only as told by your health care provider.  If you smoke, do not smoke without supervision.  Keep all follow-up visits as told by your health care provider. This is important. Contact a health care provider if:  You keep feeling nauseous or you keep vomiting.  You feel light-headed.  You develop a  rash.  You have a fever. Get help right away if:  You have trouble breathing. This information is not intended to replace advice given to you by your health care provider. Make sure you discuss any questions you have with your health care provider. Document Released: 04/15/2013 Document Revised: 11/28/2015 Document Reviewed: 10/15/2015 Elsevier Interactive Patient Education  [employer].

## 2017-09-24 ENCOUNTER — Encounter: Payer: Self-pay | Admitting: Oncology

## 2017-09-24 ENCOUNTER — Encounter (INDEPENDENT_AMBULATORY_CARE_PROVIDER_SITE_OTHER): Payer: Self-pay

## 2017-09-24 ENCOUNTER — Ambulatory Visit (INDEPENDENT_AMBULATORY_CARE_PROVIDER_SITE_OTHER): Payer: Medicare PPO | Admitting: Oncology

## 2017-09-24 ENCOUNTER — Other Ambulatory Visit: Payer: Self-pay

## 2017-09-24 VITALS — BP 142/83 | HR 92 | Temp 98.1°F | Ht 72.0 in | Wt 156.6 lb

## 2017-09-24 DIAGNOSIS — R630 Anorexia: Secondary | ICD-10-CM

## 2017-09-24 DIAGNOSIS — D649 Anemia, unspecified: Secondary | ICD-10-CM | POA: Diagnosis not present

## 2017-09-24 DIAGNOSIS — Z6821 Body mass index (BMI) 21.0-21.9, adult: Secondary | ICD-10-CM

## 2017-09-24 NOTE — Patient Instructions (Addendum)
To lab today Return visit 2-3 months We sent a referral to Dr Fuller Plan to evaluate  for upper endoscopy

## 2017-09-25 LAB — CBC WITH DIFFERENTIAL/PLATELET
Basophils Absolute: 0 10*3/uL (ref 0.0–0.2)
Basos: 0 %
EOS (ABSOLUTE): 0 10*3/uL (ref 0.0–0.4)
Eos: 0 %
Hematocrit: 31.8 % — ABNORMAL LOW (ref 37.5–51.0)
Hemoglobin: 10.7 g/dL — ABNORMAL LOW (ref 13.0–17.7)
Immature Grans (Abs): 0 10*3/uL (ref 0.0–0.1)
Immature Granulocytes: 0 %
Lymphocytes Absolute: 2.6 10*3/uL (ref 0.7–3.1)
Lymphs: 34 %
MCH: 31.1 pg (ref 26.6–33.0)
MCHC: 33.6 g/dL (ref 31.5–35.7)
MCV: 92 fL (ref 79–97)
Monocytes Absolute: 0.5 10*3/uL (ref 0.1–0.9)
Monocytes: 6 %
Neutrophils Absolute: 4.4 10*3/uL (ref 1.4–7.0)
Neutrophils: 60 %
Platelets: 350 10*3/uL (ref 150–379)
RBC: 3.44 x10E6/uL — ABNORMAL LOW (ref 4.14–5.80)
RDW: 14.6 % (ref 12.3–15.4)
WBC: 7.5 10*3/uL (ref 3.4–10.8)

## 2017-09-25 LAB — RETICULOCYTES: Retic Ct Pct: 1.4 % (ref 0.6–2.6)

## 2017-09-25 LAB — ERYTHROPOIETIN: Erythropoietin: 7 m[IU]/mL (ref 2.6–18.5)

## 2017-09-25 NOTE — Progress Notes (Signed)
Hematology and Oncology Follow Up Visit  Dennis Macias [id number] 1951-01-03 67 y.o. 09/25/2017 3:12 PM   Principle Diagnosis: Encounter Diagnosis  Name Primary?  . Normochromic anemia Yes     Interim History: The patient returns today along with his wife and children to review results of ancillary laboratory studies and results of bone marrow aspiration and biopsy for further evaluation of his idiopathic normochromic normocytic anemia.  Please see my office consultation note from August 26, 2017 for complete details of his medical history. Briefly, he developed an abrupt fall in his hemoglobin which was recorded as 15.8 on September 12, 2015 down to 10.5 on October 03, 2016.  We have no recorded CBCs prior to March 2017 and no interim hemoglobins between March 2017 and March 2018.  Hemoglobins have remained low but stable at or above 10 g over last year with today's value on September 24, 2017 10.7 with MCV 92.  He has a normal white count, differential, and platelet count.  No red flags seen on review of the peripheral blood film. A myeloma screen was negative with normal quantitative immunoglobulins, no monoclonal protein seen on immunofixation electrophoresis, and a normal kappa/lambda light chain ratio.   There is no evidence for a hemolytic process.  Reticulocyte count 1.4%.  LDH 199. Urine negative for blood, bilirubin, and hemoglobin. Vitamin studies with normal H74 and folic acid but elevated methylmalonic acid. Erythropoietin level mid range normal at 7. TSH normal 2.9 ESR 2 mm PSA borderline elevated 5.3 and urology involved. HIV screen was negative in March 2017, hepatitis C negative, Bilirubin not recorded since March 2017 when it was normal at 1.0.  Liver functions normal at that time.  Ferritin increased 412 normal up to 400 June 2018. Bone marrow September 19, 2017 normocellular, no evidence for myelodysplasia, no lymphoid infiltrates, no plasma cell infiltrates.  No abnormal monoclonal  population on flow cytometry. Colonoscopy normal March 06, 2017 He has never had an upper endoscopy but has no GI symptoms.  Denies dysphagia.  Appetite is poor.  Current weight 157 pounds.  Weight 1 year ago 172 pounds    Medications: reviewed  Allergies: No Known Allergies   Physical Exam: Blood pressure (!) 142/83, pulse 92, temperature 98.1 F (36.7 C), temperature source Oral, height 6' (1.829 m), weight 156 lb 9.6 oz (71 kg), SpO2 100 %. Wt Readings from Last 3 Encounters:  09/24/17 156 lb 9.6 oz (71 kg)  09/19/17 157 lb (71.2 kg)  08/26/17 157 lb 9.6 oz (71.5 kg)     General appearance: Patient not examined today   Lab Results: CBC W/Diff    Component Value Date/Time   WBC 7.5 09/24/2017 1413   WBC 9.5 09/19/2017 0730   RBC 3.44 (L) 09/24/2017 1413   RBC 3.43 (L) 09/19/2017 0730   HGB 10.7 (L) 09/24/2017 1413   HCT 31.8 (L) 09/24/2017 1413   PLT 350 09/24/2017 1413   MCV 92 09/24/2017 1413   MCH 31.1 09/24/2017 1413   MCH 30.9 09/19/2017 0730   MCHC 33.6 09/24/2017 1413   MCHC 34.1 09/19/2017 0730   RDW 14.6 09/24/2017 1413   LYMPHSABS 2.6 09/24/2017 1413   MONOABS 0.4 09/19/2017 0730   EOSABS 0.0 09/24/2017 1413   BASOSABS 0.0 09/24/2017 1413     Chemistry      Component Value Date/Time   NA 139 07/26/2017 2149   NA 139 02/26/2017 0942   K 4.0 07/26/2017 2149   CL 105 07/26/2017 2149   CO2  23 07/26/2017 2149   BUN 13 07/26/2017 2149   BUN 9 02/26/2017 0942   CREATININE 1.20 07/26/2017 2149      Component Value Date/Time   CALCIUM 9.1 07/26/2017 2149   ALKPHOS 66 09/12/2015 1347   AST 13 (L) 09/12/2015 1347   ALT 10 (L) 09/12/2015 1347   BILITOT 1.0 09/12/2015 1347         Impression:  Unexplained normochromic normocytic anemia with a 15 pound weight loss over 1 year, anorexia, but no specific GI symptoms.  Extensive evaluation summarized above has not yielded a diagnosis.  At this point I would still be concerned with possibility of  occult malignancy. I am referring him back to Dr. Fuller Plan to consider upper endoscopy.  100% of this visit was spent with direct face-to-face counseling and review of data with patient and multiple family members.  CC: Patient Care Team: Jule Ser, DO as PCP - General (Internal Medicine) Dr. Lucio Edward  Murriel Hopper, MD, [place]  Hematology-Oncology/Internal Medicine     3/20/20193:12 PM

## 2017-09-27 ENCOUNTER — Other Ambulatory Visit: Payer: Self-pay | Admitting: Oncology

## 2017-09-27 LAB — METHYLMALONIC ACID, SERUM: Methylmalonic Acid: 850 nmol/L — ABNORMAL HIGH (ref 0–378)

## 2017-09-27 MED ORDER — VITAMIN B-12 100 MCG PO TABS: 100.0000 ug | ORAL_TABLET | Freq: Every day | ORAL | 3 refills | Status: DC

## 2017-09-27 NOTE — Progress Notes (Signed)
MMA progressive increase. B12 100 microgram PO daily sent to pt's pharmacy. Call to his cell phone. Message left. Call if questions.

## 2017-10-02 ENCOUNTER — Encounter (HOSPITAL_COMMUNITY): Payer: Self-pay | Admitting: Oncology

## 2017-10-02 LAB — TISSUE HYBRIDIZATION (BONE MARROW)-NCBH

## 2017-10-02 LAB — CHROMOSOME ANALYSIS, BONE MARROW

## 2017-10-07 HISTORY — PX: REFRACTIVE SURGERY: SHX103

## 2017-10-22 ENCOUNTER — Encounter: Payer: Self-pay | Admitting: Internal Medicine

## 2017-10-22 DIAGNOSIS — R972 Elevated prostate specific antigen [PSA]: Secondary | ICD-10-CM | POA: Insufficient documentation

## 2017-10-29 ENCOUNTER — Ambulatory Visit: Payer: Medicare PPO | Admitting: Sports Medicine

## 2017-10-31 DIAGNOSIS — H40051 Ocular hypertension, right eye: Secondary | ICD-10-CM | POA: Diagnosis not present

## 2017-10-31 DIAGNOSIS — H4312 Vitreous hemorrhage, left eye: Secondary | ICD-10-CM | POA: Diagnosis not present

## 2017-10-31 DIAGNOSIS — Z961 Presence of intraocular lens: Secondary | ICD-10-CM | POA: Diagnosis not present

## 2017-10-31 DIAGNOSIS — H3562 Retinal hemorrhage, left eye: Secondary | ICD-10-CM | POA: Diagnosis not present

## 2017-11-04 DIAGNOSIS — E113513 Type 2 diabetes mellitus with proliferative diabetic retinopathy with macular edema, bilateral: Secondary | ICD-10-CM | POA: Diagnosis not present

## 2017-11-04 DIAGNOSIS — E113512 Type 2 diabetes mellitus with proliferative diabetic retinopathy with macular edema, left eye: Secondary | ICD-10-CM | POA: Diagnosis not present

## 2017-11-04 DIAGNOSIS — H4312 Vitreous hemorrhage, left eye: Secondary | ICD-10-CM | POA: Diagnosis not present

## 2017-11-04 DIAGNOSIS — H35372 Puckering of macula, left eye: Secondary | ICD-10-CM | POA: Diagnosis not present

## 2017-11-04 DIAGNOSIS — E113511 Type 2 diabetes mellitus with proliferative diabetic retinopathy with macular edema, right eye: Secondary | ICD-10-CM | POA: Diagnosis not present

## 2017-11-05 ENCOUNTER — Ambulatory Visit: Payer: Medicare PPO | Admitting: Gastroenterology

## 2017-11-05 ENCOUNTER — Encounter: Payer: Self-pay | Admitting: Gastroenterology

## 2017-11-05 ENCOUNTER — Other Ambulatory Visit (INDEPENDENT_AMBULATORY_CARE_PROVIDER_SITE_OTHER): Payer: Medicare PPO

## 2017-11-05 VITALS — BP 110/70 | HR 68 | Ht 72.0 in | Wt 157.0 lb

## 2017-11-05 DIAGNOSIS — R634 Abnormal weight loss: Secondary | ICD-10-CM

## 2017-11-05 DIAGNOSIS — D649 Anemia, unspecified: Secondary | ICD-10-CM

## 2017-11-05 DIAGNOSIS — Z86010 Personal history of colonic polyps: Secondary | ICD-10-CM | POA: Diagnosis not present

## 2017-11-05 LAB — BASIC METABOLIC PANEL
BUN: 12 mg/dL (ref 6–23)
CO2: 28 mEq/L (ref 19–32)
Calcium: 8.7 mg/dL (ref 8.4–10.5)
Chloride: 106 mEq/L (ref 96–112)
Creatinine, Ser: 0.93 mg/dL (ref 0.40–1.50)
GFR: 86.27 mL/min (ref >60.00)
Glucose, Bld: 253 mg/dL — ABNORMAL HIGH (ref 70–99)
Potassium: 3.9 mEq/L (ref 3.5–5.1)
Sodium: 141 mEq/L (ref 135–145)

## 2017-11-05 NOTE — Progress Notes (Signed)
    History of Present Illness: This is a 67 year old male referred for further evaluation of a normocytic anemia and weight loss.  He is accompanied by his wife. Recent evaluation for anemia by Dr. Beryle Beams included a bone marrow aspiration and apparently no specific cause for anemia was found.  Dr. Beryle Beams was concerned about an occult malignancy.  Patient offers limited information and cannot provide many details.  He states that certain foods odors cause his head to sweat and at that point he no longer wants to eat.  Thus he is skipping many meals.  He has experienced a slow, gradual weight loss of over 15 pounds over the past year. Denies abdominal pain, constipation, diarrhea, change in stool caliber, melena, hematochezia, nausea, vomiting, dysphagia, reflux symptoms, chest pain.  Colonoscopy 02/2017: - Two 6 to 7 mm polyps in the ascending colon, removed with a cold snare. Resected and retrieved. - Two 10 to 16 mm polyps in the rectum, removed piecemeal with a hot snare. Resected and retrieved. - Internal hemorrhoids. - The examination was otherwise normal on direct and retroflexion views.   All polyps were tubular adenomas. The bowel preparation was felt adequate to identify larger polyps. A more extensive bowel bowel preparation was recommended for his repeat colonoscopy  Current Medications, Allergies, Past Medical History, Past Surgical History, Family History and Social History were reviewed in [employer] record.  Physical Exam: General: Well developed, well nourished, no acute distress Head: Normocephalic and atraumatic Eyes:  sclerae anicteric, EOMI Ears: Normal auditory acuity Mouth: No deformity or lesions Lungs: Clear throughout to auscultation Heart: Regular rate and rhythm; no murmurs, rubs or bruits Abdomen: Soft, non tender and non distended. No masses, hepatosplenomegaly or hernias noted. Normal Bowel sounds Rectal: not  done Musculoskeletal: Symmetrical with no gross deformities  Pulses:  Normal pulses noted Extremities: No clubbing, cyanosis, edema or deformities noted Neurological: Alert oriented x 4, grossly nonfocal Psychological:  Alert and cooperative. Normal mood and affect  Assessment and Recommendations:  1. Normocytic anemia, weight loss.  Weight loss could be related to decreased calorie intake.  Given unexplained normocytic anemia rule out occult malignancy.  Schedule abdominal/pelvic CT and EGD. The risks (including bleeding, perforation, infection, missed lesions, medication reactions and possible hospitalization or surgery if complications occur), benefits, and alternatives to endoscopy with possible biopsy and possible dilation were discussed with the patient and they consent to proceed. Advised to use Boost,  Ensure or other similar products tid for any skipped meal and in between meals to stabilize his weight.  2.  Sweating abnormality, related to certain smells. Etiology unclear.  Further evaluation per PCP.  3.  Personal history of adenomatous colon polyps, piecemeal polypectomy.  A 1 year interval colonoscopy to review Piecemeal polypectomy sites is recommended in August with a more extensive bowel prep.

## 2017-11-05 NOTE — Patient Instructions (Signed)
Your provider has requested that you go to the basement level for lab work before leaving today. Press "B" on the elevator. The lab is located at the first door on the left as you exit the elevator.   You have been scheduled for an endoscopy. Please follow written instructions given to you at your visit today. If you use inhalers (even only as needed), please bring them with you on the day of your procedure. Your physician has requested that you go to www.startemmi.com and enter the access code given to you at your visit today. This web site gives a general overview about your procedure. However, you should still follow specific instructions given to you by our office regarding your preparation for the procedure.  You have been scheduled for a CT scan of the abdomen and pelvis at [facility] ([address]---this is in the same building as [facility]).   You are scheduled on 11/11/17 at 9:00am. You should arrive 15 minutes prior to your appointment time for registration. Please follow the written instructions below on the day of your exam:  WARNING: IF YOU ARE ALLERGIC TO IODINE/X-RAY DYE, PLEASE NOTIFY RADIOLOGY IMMEDIATELY AT [phone]! YOU WILL BE GIVEN A 13 HOUR PREMEDICATION PREP.  1) Do not eat or drink anything after 5:00am (4 hours prior to your test) 2) You have been given 2 bottles of oral contrast to drink. The solution may taste better if refrigerated, but do NOT add ice or any other liquid to this solution. Shake well before drinking.    Drink 1 bottle of contrast @ 7:00am (2 hours prior to your exam)  Drink 1 bottle of contrast @ 8:00am (1 hour prior to your exam)  You may take any medications as prescribed with a small amount of water except for the following: Metformin, Glucophage, Glucovance, Avandamet, Riomet, Fortamet, Actoplus Met, Janumet, Glumetza or Metaglip. The above medications must be held the day of the exam AND 48 hours after the exam.  The  purpose of you drinking the oral contrast is to aid in the visualization of your intestinal tract. The contrast solution may cause some diarrhea. Before your exam is started, you will be given a small amount of fluid to drink. Depending on your individual set of symptoms, you may also receive an intravenous injection of x-ray contrast/dye. Plan on being at [facility] for 30 minutes or longer, depending on the type of exam you are having performed.  This test typically takes 30-45 minutes to complete.  If you have any questions regarding your exam or if you need to reschedule, you may call the CT department at [phone] between the hours of 8:00 am and 5:00 pm, Monday-Friday.  ________________________________________________________________________  Thank you for choosing me and [facility].  Pricilla Riffle. Dagoberto Ligas., MD., [facility]

## 2017-11-11 ENCOUNTER — Ambulatory Visit (INDEPENDENT_AMBULATORY_CARE_PROVIDER_SITE_OTHER)
Admission: RE | Admit: 2017-11-11 | Discharge: 2017-11-11 | Disposition: A | Discharge status: Home / Self Care | Payer: Medicare PPO | Source: Ambulatory Visit | Attending: Gastroenterology | Admitting: Gastroenterology

## 2017-11-11 ENCOUNTER — Telehealth: Payer: Self-pay | Admitting: Gastroenterology

## 2017-11-11 DIAGNOSIS — R634 Abnormal weight loss: Secondary | ICD-10-CM

## 2017-11-11 DIAGNOSIS — K760 Fatty (change of) liver, not elsewhere classified: Secondary | ICD-10-CM | POA: Diagnosis not present

## 2017-11-11 DIAGNOSIS — D649 Anemia, unspecified: Secondary | ICD-10-CM

## 2017-11-11 MED ORDER — IOPAMIDOL (ISOVUE-300) INJECTION 61%
100.0000 mL | Freq: Once | INTRAVENOUS | Status: AC | PRN
  Administered 2017-11-11: 100 mL via INTRAVENOUS

## 2017-11-11 NOTE — Telephone Encounter (Signed)
Pt had CT today 5.6.19 and needs to know if he can take medications now that the CT is done. Dennis Macias pt girlfriend states he was told he could but on instructions is states do not take metformin today. Pt girlfriend would like a call to discuss.

## 2017-11-11 NOTE — Telephone Encounter (Signed)
Spoke with pts girlfriend and let her know he is ok to take his medications.

## 2017-11-12 NOTE — Telephone Encounter (Signed)
You may take any medications as prescribed with a small amount of water except for the following: Metformin, Glucophage, Glucovance, Avandamet, Riomet, Fortamet, Actoplus Met, Janumet, Glumetza or Metaglip. The above medications must be held the day of the exam AND 48 hours after the exam.

## 2017-11-12 NOTE — Telephone Encounter (Signed)
Patient is returning phone call best call back # [phone].

## 2017-11-12 NOTE — Telephone Encounter (Signed)
I did advise the pt that he is NOT to take metformin for 48 hours after the CT.  He verbalized understanding and will hold the metformin until 48 hours after the CT.

## 2017-11-18 ENCOUNTER — Encounter: Payer: Self-pay | Admitting: Gastroenterology

## 2017-11-23 DIAGNOSIS — Z7984 Long term (current) use of oral hypoglycemic drugs: Secondary | ICD-10-CM | POA: Diagnosis not present

## 2017-11-23 DIAGNOSIS — E113591 Type 2 diabetes mellitus with proliferative diabetic retinopathy without macular edema, right eye: Secondary | ICD-10-CM | POA: Diagnosis not present

## 2017-11-23 DIAGNOSIS — H409 Unspecified glaucoma: Secondary | ICD-10-CM | POA: Diagnosis not present

## 2017-11-23 DIAGNOSIS — Z6820 Body mass index (BMI) 20.0-20.9, adult: Secondary | ICD-10-CM | POA: Diagnosis not present

## 2017-11-23 DIAGNOSIS — E1142 Type 2 diabetes mellitus with diabetic polyneuropathy: Secondary | ICD-10-CM | POA: Diagnosis not present

## 2017-11-23 DIAGNOSIS — E785 Hyperlipidemia, unspecified: Secondary | ICD-10-CM | POA: Diagnosis not present

## 2017-11-23 DIAGNOSIS — Z8673 Personal history of transient ischemic attack (TIA), and cerebral infarction without residual deficits: Secondary | ICD-10-CM | POA: Diagnosis not present

## 2017-11-23 DIAGNOSIS — I10 Essential (primary) hypertension: Secondary | ICD-10-CM | POA: Diagnosis not present

## 2017-11-23 DIAGNOSIS — Z9841 Cataract extraction status, right eye: Secondary | ICD-10-CM | POA: Diagnosis not present

## 2017-11-25 DIAGNOSIS — E113513 Type 2 diabetes mellitus with proliferative diabetic retinopathy with macular edema, bilateral: Secondary | ICD-10-CM | POA: Diagnosis not present

## 2017-11-25 DIAGNOSIS — H4312 Vitreous hemorrhage, left eye: Secondary | ICD-10-CM | POA: Diagnosis not present

## 2017-11-25 DIAGNOSIS — H4051X3 Glaucoma secondary to other eye disorders, right eye, severe stage: Secondary | ICD-10-CM | POA: Diagnosis not present

## 2017-11-25 DIAGNOSIS — E113512 Type 2 diabetes mellitus with proliferative diabetic retinopathy with macular edema, left eye: Secondary | ICD-10-CM | POA: Diagnosis not present

## 2017-11-26 ENCOUNTER — Other Ambulatory Visit: Payer: Self-pay

## 2017-11-26 ENCOUNTER — Encounter: Payer: Self-pay | Admitting: Oncology

## 2017-11-26 ENCOUNTER — Ambulatory Visit (INDEPENDENT_AMBULATORY_CARE_PROVIDER_SITE_OTHER): Payer: Medicare PPO | Admitting: Oncology

## 2017-11-26 ENCOUNTER — Telehealth: Payer: Self-pay | Admitting: Dietician

## 2017-11-26 ENCOUNTER — Encounter: Payer: Self-pay | Admitting: Dietician

## 2017-11-26 VITALS — BP 163/83 | HR 74 | Temp 99.3°F | Ht 72.0 in | Wt 162.4 lb

## 2017-11-26 DIAGNOSIS — I700 Atherosclerosis of aorta: Secondary | ICD-10-CM | POA: Diagnosis not present

## 2017-11-26 DIAGNOSIS — Z6822 Body mass index (BMI) 22.0-22.9, adult: Secondary | ICD-10-CM | POA: Diagnosis not present

## 2017-11-26 DIAGNOSIS — D649 Anemia, unspecified: Secondary | ICD-10-CM | POA: Diagnosis not present

## 2017-11-26 DIAGNOSIS — K760 Fatty (change of) liver, not elsewhere classified: Secondary | ICD-10-CM

## 2017-11-26 DIAGNOSIS — Z79899 Other long term (current) drug therapy: Secondary | ICD-10-CM | POA: Diagnosis not present

## 2017-11-26 DIAGNOSIS — Z8719 Personal history of other diseases of the digestive system: Secondary | ICD-10-CM | POA: Diagnosis not present

## 2017-11-26 DIAGNOSIS — H9190 Unspecified hearing loss, unspecified ear: Secondary | ICD-10-CM | POA: Diagnosis not present

## 2017-11-26 DIAGNOSIS — N400 Enlarged prostate without lower urinary tract symptoms: Secondary | ICD-10-CM

## 2017-11-26 DIAGNOSIS — R634 Abnormal weight loss: Secondary | ICD-10-CM

## 2017-11-26 MED ORDER — VITAMIN B-12 100 MCG PO TABS: 100.0000 ug | ORAL_TABLET | Freq: Every day | ORAL | 3 refills | Status: DC

## 2017-11-26 NOTE — Progress Notes (Signed)
Hematology and Oncology Follow Up Visit  Dennis Macias [id number] 1950-07-29 67 y.o. 11/26/2017 11:47 AM   Principle Diagnosis: Encounter Diagnosis  Name Primary?  . Normochromic anemia Yes  Clinical summary: 67 year old man who developed a sudden unexplained anemia with concomitant weight loss. He developed an abrupt fall in his hemoglobin which was recorded as 15.8 on September 12, 2015 down to 10.5 on October 03, 2016.  We have no recorded CBCs prior to March 2017 and no interim hemoglobins between March 2017 and March 2018.  Hemoglobins have remained low but stable at or above 10 g over last year with value on September 24, 2017 10.7 with MCV 92.  He has a normal white count, differential, and platelet count.  No red flags seen on review of the peripheral blood film. A myeloma screen was negative with normal quantitative immunoglobulins, no monoclonal protein seen on immunofixation electrophoresis, and a normal kappa/lambda light chain ratio.   There is no evidence for a hemolytic process.  Reticulocyte count 1.4%.  LDH 199. Urine negative for blood, bilirubin, and hemoglobin. Vitamin studies with normal K81 and folic acid but elevated methylmalonic acid. Erythropoietin level mid range normal at 7. TSH normal 2.9 ESR 2 mm PSA borderline elevated 5.3 and urology involved. HIV screen was negative in March 2017, hepatitis C negative, Bilirubin not recorded since March 2017 when it was normal at 1.0.  Liver functions normal at that time.  Ferritin increased 412 normal up to 400 June 2018. Bone marrow September 19, 2017 normocellular, no evidence for myelodysplasia, no lymphoid infiltrates, no plasma cell infiltrates.  No abnormal monoclonal population on flow cytometry. Colonoscopy normal March 06, 2017 He has never had an upper endoscopy but has no GI symptoms.  Denies dysphagia.  Appetite is poor.  Current weight 157 pounds.  Weight 1 year ago 172 pounds    Interim History:  At time of recent March 19  visit I referred him to gastroenterology due to my concern for possible underlying occult malignancy.  He had an initial office evaluation.  A CT scan of the abdomen was done and shows no gross evidence for malignancy in the abdomen.  He has hepatic steatosis, atherosclerotic changes in his aorta, prostatic hypertrophy.   He does not hear well and frequently misunderstands phone conversations.  The person who called him with the results summarized the 3 main findings but he did not remember what he was told.  He is scheduled for upper endoscopy later this week.  He had a normal colonoscopy except for removal of 2 small hyperplastic polyps in late August 2018. Gastroenterologist encouraged him to drink Ensure twice daily.  In  just a short time, his weight has gone up 5 pounds since his March visit with me.  No new symptoms today.    In the interim since his last visit, I obtained a methylmalonic acid level to follow-up on a previous study which showed an elevated level in August 2018.  There was a progressive rise on the recent March 19 study.  I called the patient and told him I would be sending a prescription to the [facility] outpatient pharmacy to begin oral B12.  He did not understand me and never picked up the prescription.  He did understand that he was supposed to take a vitamin.  He started taking Centrum silver vitamins 1 a day. His lady friend accompanies him today.  I got him back on track and he will go and pick up the prescription for  the B12 today. No reason to check a blood count today since he has not been taking the appropriate dose.   Medications: reviewed  Allergies: No Known Allergies  Review of Systems: See interim history.  Physical Exam: Patient not examined today. Blood pressure (!) 163/83, pulse 74, temperature 99.3 F (37.4 C), temperature source Oral, height 6' (1.829 m), weight 162 lb 6.4 oz (73.7 kg), SpO2 100 %. Wt Readings from Last 3 Encounters:  11/26/17 162 lb 6.4 oz  (73.7 kg)  11/05/17 157 lb (71.2 kg)  09/24/17 156 lb 9.6 oz (71 kg)       Lab Results: CBC W/Diff    Component Value Date/Time   WBC 7.5 09/24/2017 1413   WBC 9.5 09/19/2017 0730   RBC 3.44 (L) 09/24/2017 1413   RBC 3.43 (L) 09/19/2017 0730   HGB 10.7 (L) 09/24/2017 1413   HCT 31.8 (L) 09/24/2017 1413   PLT 350 09/24/2017 1413   MCV 92 09/24/2017 1413   MCH 31.1 09/24/2017 1413   MCH 30.9 09/19/2017 0730   MCHC 33.6 09/24/2017 1413   MCHC 34.1 09/19/2017 0730   RDW 14.6 09/24/2017 1413   LYMPHSABS 2.6 09/24/2017 1413   MONOABS 0.4 09/19/2017 0730   EOSABS 0.0 09/24/2017 1413   BASOSABS 0.0 09/24/2017 1413     Chemistry      Component Value Date/Time   NA 141 11/05/2017 1126   NA 139 02/26/2017 0942   K 3.9 11/05/2017 1126   CL 106 11/05/2017 1126   CO2 28 11/05/2017 1126   BUN 12 11/05/2017 1126   BUN 9 02/26/2017 0942   CREATININE 0.93 11/05/2017 1126      Component Value Date/Time   CALCIUM 8.7 11/05/2017 1126   ALKPHOS 66 09/12/2015 1347   AST 13 (L) 09/12/2015 1347   ALT 10 (L) 09/12/2015 1347   BILITOT 1.0 09/12/2015 1347       Radiological Studies: Ct Abdomen Pelvis W Contrast  Result Date: 11/11/2017 CLINICAL DATA:  Anemia and weight loss. EXAM: CT ABDOMEN AND PELVIS WITH CONTRAST TECHNIQUE: Multidetector CT imaging of the abdomen and pelvis was performed using the standard protocol following bolus administration of intravenous contrast. CONTRAST:  164m ISOVUE-300 IOPAMIDOL (ISOVUE-300) INJECTION 61% COMPARISON:  None. FINDINGS: Lower chest: Negative. Hepatobiliary: Liver appears slightly decreased in attenuation diffusely. Gallbladder is decompressed. No biliary ductal dilatation. Pancreas: Negative. Spleen: Negative. Adrenals/Urinary Tract: Adrenal glands and kidneys are unremarkable. Ureters are decompressed. Bladder is unremarkable. Stomach/Bowel: Stomach, small bowel, appendix and colon are unremarkable. Vascular/Lymphatic: Atherosclerotic  calcification of the arterial vasculature without abdominal aortic aneurysm. No pathologically enlarged lymph nodes. Reproductive: Prostate is enlarged. Other: No free fluid. Tiny right inguinal hernia contains fat. Mesenteries and peritoneum are unremarkable. Musculoskeletal: Degenerative changes in the spine. No worrisome lytic or sclerotic lesions. IMPRESSION: 1. No findings to explain the patient's given symptoms. 2. Hepatic steatosis. 3.  Aortic atherosclerosis (ICD10-170.0). 4. Enlarged prostate. Electronically Signed   By: MLorin PicketM.D.   On: 11/11/2017 13:19    Impression:  Normochromic anemia and unexplained weight loss. Extensive evaluation unrevealing to date except for persistent and progressive elevation of methylmalonic acid.  CT scan of the abdomen and pelvis Nov 11, 2017 unrevealing for any malignant pathology. He misunderstood instructions and did not start B12 and the appropriate dose.  He is now back on track and will pick up the prescription today. Upper endoscopy planned for later this week. Repeat blood counts in 6 weeks to assess effects of B12  replacement.   CC: Patient Care Team: Jule Ser, DO as PCP - General (Internal Medicine) Annia Belt, MD as Consulting Physician (Oncology) Ladene Artist, MD as Consulting Physician (Gastroenterology)   Murriel Hopper, MD, [place]  Hematology-Oncology/Internal Medicine     5/21/201911:47 AM

## 2017-11-26 NOTE — Telephone Encounter (Signed)
Called about most recent eye exam. He saw Dr. Deloria Lair yesterday.

## 2017-11-26 NOTE — Patient Instructions (Signed)
Start B12 vitamin one a day Return for blood count on Tuesday July 23 Visit with Dr Darnell Level 1 week after lab

## 2017-11-29 ENCOUNTER — Other Ambulatory Visit: Payer: Self-pay

## 2017-11-29 ENCOUNTER — Ambulatory Visit (AMBULATORY_SURGERY_CENTER): Payer: Medicare PPO | Admitting: Gastroenterology

## 2017-11-29 ENCOUNTER — Encounter: Payer: Self-pay | Admitting: Gastroenterology

## 2017-11-29 VITALS — BP 152/82 | HR 73 | Temp 98.2°F | Resp 18 | Ht 72.0 in | Wt 157.0 lb

## 2017-11-29 DIAGNOSIS — K2970 Gastritis, unspecified, without bleeding: Secondary | ICD-10-CM | POA: Diagnosis not present

## 2017-11-29 DIAGNOSIS — B9681 Helicobacter pylori [H. pylori] as the cause of diseases classified elsewhere: Secondary | ICD-10-CM | POA: Diagnosis not present

## 2017-11-29 DIAGNOSIS — D649 Anemia, unspecified: Secondary | ICD-10-CM | POA: Diagnosis not present

## 2017-11-29 DIAGNOSIS — K2950 Unspecified chronic gastritis without bleeding: Secondary | ICD-10-CM | POA: Diagnosis not present

## 2017-11-29 DIAGNOSIS — K317 Polyp of stomach and duodenum: Secondary | ICD-10-CM | POA: Diagnosis not present

## 2017-11-29 DIAGNOSIS — Z8673 Personal history of transient ischemic attack (TIA), and cerebral infarction without residual deficits: Secondary | ICD-10-CM | POA: Diagnosis not present

## 2017-11-29 DIAGNOSIS — R634 Abnormal weight loss: Secondary | ICD-10-CM | POA: Diagnosis not present

## 2017-11-29 DIAGNOSIS — I10 Essential (primary) hypertension: Secondary | ICD-10-CM | POA: Diagnosis not present

## 2017-11-29 DIAGNOSIS — E119 Type 2 diabetes mellitus without complications: Secondary | ICD-10-CM | POA: Diagnosis not present

## 2017-11-29 MED ORDER — SODIUM CHLORIDE 0.9 % IV SOLN: 500.0000 mL | Freq: Once | INTRAVENOUS | Status: DC

## 2017-11-29 NOTE — Op Note (Signed)
[facility] Patient Name: Dennis Macias Procedure Date: 11/29/2017 10:07 AM MRN: [id number] Endoscopist: Ladene Artist , MD Age: 67 Referring MD:  Date of Birth: 08-29-1950 Gender: Male Account #: [id number] Procedure:                Upper GI endoscopy Indications:              Anemia, Weight loss Medicines:                Monitored Anesthesia Care Procedure:                Pre-Anesthesia Assessment:                           - Prior to the procedure, a History and Physical                            was performed, and patient medications and                            allergies were reviewed. The patient's tolerance of                            previous anesthesia was also reviewed. The risks                            and benefits of the procedure and the sedation                            options and risks were discussed with the patient.                            All questions were answered, and informed consent                            was obtained. Prior Anticoagulants: The patient has                            taken no previous anticoagulant or antiplatelet                            agents. ASA Grade Assessment: II - A patient with                            mild systemic disease. After reviewing the risks                            and benefits, the patient was deemed in                            satisfactory condition to undergo the procedure.                           After obtaining informed consent, the endoscope was  passed under direct vision. Throughout the                            procedure, the patient's blood pressure, pulse, and                            oxygen saturations were monitored continuously. The                            Endoscope was introduced through the mouth, and                            advanced to the second part of duodenum. The upper                            GI endoscopy was accomplished without  difficulty.                            The patient tolerated the procedure well. Scope In: Scope Out: Findings:                 The examined esophagus was normal.                           Diffuse moderate inflammation characterized by                            erythema and granularity was found in the gastric                            fundus and in the gastric body. Biopsies were taken                            with a cold forceps for histology.                           A single 7 mm sessile polyp with mild bleeding was                            found in the gastric fundus. The polyp was removed                            with a hot snare. Resection and retrieval were                            complete.                           Two small non bleeding angiodysplastic lesions were                            found in the gastric antrum.                           The exam of  the stomach was otherwise normal.                           The duodenal bulb and second portion of the                            duodenum were normal. Complications:            No immediate complications. Estimated Blood Loss:     Estimated blood loss was minimal. Impression:               - Normal esophagus.                           - Gastritis. Biopsied.                           - A single gastric polyp. Resected and retrieved.                           - Two non-bleeding angiodysplastic lesions in the                            stomach.                           - Normal duodenal bulb and second portion of the                            duodenum. Recommendation:           - Patient has a contact number available for                            emergencies. The signs and symptoms of potential                            delayed complications were discussed with the                            patient. Return to normal activities tomorrow.                            Written discharge instructions were  provided to the                            patient.                           - Resume previous diet.                           - Continue present medications.                           - Await pathology results.                           -  No aspirin, ibuprofen, naproxen, or other                            non-steroidal anti-inflammatory drugs for 2 weeks                            after polyp removal. Ladene Artist, MD 11/29/2017 10:28:48 AM This report has been signed electronically.

## 2017-11-29 NOTE — Patient Instructions (Signed)
YOU HAD AN ENDOSCOPIC PROCEDURE TODAY AT [facility]:   Refer to the procedure report that was given to you for any specific questions about what was found during the examination.  If the procedure report does not answer your questions, please call your gastroenterologist to clarify.  If you requested that your care partner not be given the details of your procedure findings, then the procedure report has been included in a sealed envelope for you to review at your convenience later.  YOU SHOULD EXPECT: Some feelings of bloating in the abdomen. Passage of more gas than usual.  Walking can help get rid of the air that was put into your GI tract during the procedure and reduce the bloating. If you had a lower endoscopy (such as a colonoscopy or flexible sigmoidoscopy) you may notice spotting of blood in your stool or on the toilet paper. If you underwent a bowel prep for your procedure, you may not have a normal bowel movement for a few days.  Please Note:  You might notice some irritation and congestion in your nose or some drainage.  This is from the oxygen used during your procedure.  There is no need for concern and it should clear up in a day or so.  SYMPTOMS TO REPORT IMMEDIATELY:   Following upper endoscopy (EGD)  Vomiting of blood or coffee ground material  New chest pain or pain under the shoulder blades  Painful or persistently difficult swallowing  New shortness of breath  Fever of 100F or higher  Black, tarry-looking stools  Please see handouts given to you on Gastritis.   Dr. Fuller Plan wants you to avoid NSAIDS, ibuprofen, motrin or Aleve for 2 weeks.  For urgent or emergent issues, a gastroenterologist can be reached at any hour by calling [phone].   DIET:  We do recommend a small meal at first, but then you may proceed to your regular diet.  Drink plenty of fluids but you should avoid alcoholic beverages for 24 hours.  ACTIVITY:  You should plan to take it  easy for the rest of today and you should NOT DRIVE or use heavy machinery until tomorrow (because of the sedation medicines used during the test).    FOLLOW UP: Our staff will call the number listed on your records the next business day following your procedure to check on you and address any questions or concerns that you may have regarding the information given to you following your procedure. If we do not reach you, we will leave a message.  However, if you are feeling well and you are not experiencing any problems, there is no need to return our call.  We will assume that you have returned to your regular daily activities without incident.  If any biopsies were taken you will be contacted by phone or by letter within the next 1-3 weeks.  Please call us at [phone] if you have not heard about the biopsies in 3 weeks.    SIGNATURES/CONFIDENTIALITY: You and/or your care partner have signed paperwork which will be entered into your electronic medical record.  These signatures attest to the fact that that the information above on your After Visit Summary has been reviewed and is understood.  Full responsibility of the confidentiality of this discharge information lies with you and/or your care-partner.  Thank you for letting us take care of your healthcare needs today.

## 2017-11-29 NOTE — Progress Notes (Signed)
To recovery, report to RN, VSS. 

## 2017-11-29 NOTE — Progress Notes (Signed)
Called to room to assist during endoscopic procedure.  Patient ID and intended procedure confirmed with present staff. Received instructions for my participation in the procedure from the performing physician.  

## 2017-12-03 ENCOUNTER — Telehealth: Payer: Self-pay | Admitting: *Deleted

## 2017-12-03 DIAGNOSIS — E113511 Type 2 diabetes mellitus with proliferative diabetic retinopathy with macular edema, right eye: Secondary | ICD-10-CM | POA: Diagnosis not present

## 2017-12-03 NOTE — Telephone Encounter (Signed)
  Follow up Call-  Call back number 11/29/2017 03/06/2017  Post procedure Call Back phone  # [phone]  [phone]  Permission to leave phone message Yes Yes  Some recent data might be hidden     Patient questions:  Message left to call us if necessary.

## 2017-12-03 NOTE — Telephone Encounter (Signed)
  Follow up Call-  Call back number 11/29/2017 03/06/2017  Post procedure Call Back phone  # [phone]  [phone]  Permission to leave phone message Yes Yes  Some recent data might be hidden     Patient questions:  Do you have a fever, pain , or abdominal swelling? No. Pain Score  0 *  Have you tolerated food without any problems? Yes.    Have you been able to return to your normal activities? Yes.    Do you have any questions about your discharge instructions: Diet   No. Medications  No. Follow up visit  No.  Do you have questions or concerns about your Care? No.  Actions: * If pain score is 4 or above: No action needed, pain <4.

## 2017-12-12 ENCOUNTER — Telehealth: Payer: Self-pay | Admitting: Gastroenterology

## 2017-12-12 ENCOUNTER — Other Ambulatory Visit: Payer: Self-pay

## 2017-12-12 DIAGNOSIS — R972 Elevated prostate specific antigen [PSA]: Secondary | ICD-10-CM | POA: Diagnosis not present

## 2017-12-12 MED ORDER — BIS SUBCIT-METRONID-TETRACYC 140-125-125 MG PO CAPS: 3.0000 | ORAL_CAPSULE | Freq: Three times a day (TID) | ORAL | 0 refills | Status: DC

## 2017-12-12 MED ORDER — DOXYCYCLINE HYCLATE 100 MG PO CAPS: 100.0000 mg | ORAL_CAPSULE | Freq: Two times a day (BID) | ORAL | 0 refills | Status: DC

## 2017-12-12 MED ORDER — METRONIDAZOLE 250 MG PO TABS: 250.0000 mg | ORAL_TABLET | Freq: Four times a day (QID) | ORAL | 0 refills | Status: DC

## 2017-12-12 MED ORDER — OMEPRAZOLE 40 MG PO CPDR: 40.0000 mg | DELAYED_RELEASE_CAPSULE | Freq: Two times a day (BID) | ORAL | 0 refills | Status: DC

## 2017-12-12 MED ORDER — BISMUTH SUBSALICYLATE 262 MG PO CHEW: 524.0000 mg | CHEWABLE_TABLET | Freq: Four times a day (QID) | ORAL | 0 refills | Status: DC

## 2017-12-12 MED FILL — DOXYCYCLINE HYCLATE 100 MG: 100 | 14 days supply | Qty: 28 | Fill #0

## 2017-12-12 MED FILL — metroNIDAZOLE 250 MG TABS: 250 | 14 days supply | Qty: 56 | Fill #0

## 2017-12-12 MED FILL — OMEPRAZOLE DR 40 MG CAPSULE: 40 | 14 days supply | Qty: 28 | Fill #0

## 2017-12-12 NOTE — Telephone Encounter (Signed)
Left a message for patient to return my call. 

## 2017-12-12 NOTE — Telephone Encounter (Signed)
pylera  

## 2017-12-12 NOTE — Telephone Encounter (Signed)
Dennis Macias states one of the medications called in by Dr.Stark is $1000 and need something else called in. Dennis Macias will cb with which medication.

## 2017-12-12 NOTE — Telephone Encounter (Signed)
Informed patient that I sent 3 medications to the pharmacy for Dennis Macias to take x 14 days in the place of Pylera. Also, patient still needs to take omeprazole twice daily x 14 days along with these 3 medications. Patient's wife Dennis Macias verbalized understanding.

## 2017-12-13 ENCOUNTER — Encounter: Payer: Self-pay | Admitting: Internal Medicine

## 2017-12-13 DIAGNOSIS — B9681 Helicobacter pylori [H. pylori] as the cause of diseases classified elsewhere: Secondary | ICD-10-CM | POA: Insufficient documentation

## 2017-12-13 DIAGNOSIS — K2970 Gastritis, unspecified, without bleeding: Secondary | ICD-10-CM

## 2017-12-19 DIAGNOSIS — N401 Enlarged prostate with lower urinary tract symptoms: Secondary | ICD-10-CM | POA: Diagnosis not present

## 2017-12-19 DIAGNOSIS — R351 Nocturia: Secondary | ICD-10-CM | POA: Diagnosis not present

## 2017-12-19 DIAGNOSIS — R972 Elevated prostate specific antigen [PSA]: Secondary | ICD-10-CM | POA: Diagnosis not present

## 2017-12-23 ENCOUNTER — Ambulatory Visit: Payer: Medicare PPO | Admitting: Oncology

## 2017-12-26 ENCOUNTER — Other Ambulatory Visit: Payer: Self-pay

## 2017-12-26 ENCOUNTER — Encounter: Payer: Self-pay | Admitting: Internal Medicine

## 2017-12-26 ENCOUNTER — Ambulatory Visit (INDEPENDENT_AMBULATORY_CARE_PROVIDER_SITE_OTHER): Payer: Medicare PPO | Admitting: Internal Medicine

## 2017-12-26 VITALS — BP 160/77 | HR 77 | Temp 98.8°F | Resp 16 | Ht 72.0 in | Wt 167.6 lb

## 2017-12-26 DIAGNOSIS — Z7984 Long term (current) use of oral hypoglycemic drugs: Secondary | ICD-10-CM

## 2017-12-26 DIAGNOSIS — R634 Abnormal weight loss: Secondary | ICD-10-CM | POA: Diagnosis not present

## 2017-12-26 DIAGNOSIS — E11319 Type 2 diabetes mellitus with unspecified diabetic retinopathy without macular edema: Secondary | ICD-10-CM

## 2017-12-26 DIAGNOSIS — Z79899 Other long term (current) drug therapy: Secondary | ICD-10-CM

## 2017-12-26 DIAGNOSIS — I10 Essential (primary) hypertension: Secondary | ICD-10-CM | POA: Diagnosis not present

## 2017-12-26 DIAGNOSIS — Z6822 Body mass index (BMI) 22.0-22.9, adult: Secondary | ICD-10-CM

## 2017-12-26 DIAGNOSIS — D649 Anemia, unspecified: Secondary | ICD-10-CM

## 2017-12-26 DIAGNOSIS — N401 Enlarged prostate with lower urinary tract symptoms: Secondary | ICD-10-CM

## 2017-12-26 DIAGNOSIS — E538 Deficiency of other specified B group vitamins: Secondary | ICD-10-CM

## 2017-12-26 DIAGNOSIS — K2970 Gastritis, unspecified, without bleeding: Secondary | ICD-10-CM | POA: Diagnosis not present

## 2017-12-26 DIAGNOSIS — N400 Enlarged prostate without lower urinary tract symptoms: Secondary | ICD-10-CM

## 2017-12-26 DIAGNOSIS — B9681 Helicobacter pylori [H. pylori] as the cause of diseases classified elsewhere: Secondary | ICD-10-CM

## 2017-12-26 DIAGNOSIS — N138 Other obstructive and reflux uropathy: Secondary | ICD-10-CM

## 2017-12-26 LAB — POCT GLYCOSYLATED HEMOGLOBIN (HGB A1C): Hemoglobin A1C: 6.3 % — AB (ref 4.0–5.6)

## 2017-12-26 LAB — GLUCOSE, CAPILLARY: Glucose-Capillary: 148 mg/dL — ABNORMAL HIGH (ref 65–99)

## 2017-12-26 NOTE — Progress Notes (Signed)
CC: f/u H. Pylori gastritis, weight loss, normochromic anemia, BPH  HPI:  [name] is a 67 y.o. man with a past medical history listed below here today for follow up of his H pylori, weight loss, anemia, and BPH, and DM.   For details of today's visit and the status of his chronic medical issues please refer to the assessment and plan.   Past Medical History:  Diagnosis Date  . Anemia   . Blindness    right eye  . Dehydration   . Diabetes ([place])   . Glaucoma   . History of urinary retention   . Hyperlipidemia   . Hypertension   . Stroke [facility])    2017- March  . Tachycardia 08/26/2017  . Tubular adenoma of colon 02/2017  . Weight loss, non-intentional 08/26/2017   10 lbs between 6/18 & 2/19   Review of Systems:  Please see pertinent ROS reviewed in HPI and problem based charting.   Physical Exam:  Vitals:   12/26/17 1331  BP: (!) 160/77  Pulse: 77  Resp: 16  Temp: 98.8 F (37.1 C)  TempSrc: Oral  SpO2: 100%  Weight: 167 lb 9.6 oz (76 kg)  Height: 6' (1.829 m)     Physical Exam  Constitutional: He is oriented to person, place, and time and well-developed, well-nourished, and in no distress.  Thin, elderly appearing man, NAD  HENT:  Head: Normocephalic and atraumatic.  Cardiovascular: Normal rate and regular rhythm.  Pulmonary/Chest: Effort normal and breath sounds normal.  Abdominal: Soft. Bowel sounds are normal. There is no tenderness. There is no rebound.  Musculoskeletal: He exhibits no edema.  Neurological: He is alert and oriented to person, place, and time.  Skin: Skin is warm and dry.  Psychiatric: Mood and affect normal.     Assessment & Plan:   See Encounters Tab for problem based charting.  Patient discussed with Dr. Beryle Beams .  Hypertension BP Readings from Last 3 Encounters:  12/26/17 (!) 160/77  11/29/17 (!) 152/82  11/26/17 (!) 163/83   His BP has been elevated on recent evaluation.  His amlodipine was decreased to 5mg  daily  back in January and his Lisinopril was continued at the current dose of 40mg  daily.  This was in the setting of orthostatic hypotension resulting in syncope.  Given this risk, I think it is reasonable to continue the current regimen and tolerate a slightly elevated BP to avoid recurrence of syncope.  Plan: - Continue Lisinopril 40mg  daily and Amlodipine 5mg  daily. - RTC 3 months  Helicobacter pylori gastritis Diagnosed via biopsy from EGD for work up of his anemia and weight loss.  He is currently being treated with bismuth, doxycycline, metronidazole and PPI for 14 days and has 1 or 2 days or therapy left.  Given his history of anemia, B12 deficiency, and H pylori we were going to send labs for anti-parietal cell antibodies, however, patient left clinic without obtaining labs.  Will defer this work up at this time until follow up in 3 months if needed.   Diabetes mellitus with retinopathy of both eyes ([place]) A1c today is stable on metformin 1000mg  BID.  No complaints of polyuria or polydipsia.  Plan: - COntinue metformin 1000mg  BID - RTC 3 months.   BPH with obstruction/lower urinary tract symptoms Seen by urology actually 1 week ago.  He has been stable off Tamsulosin without LUTS at this time.  His urologist instructed him to continue not taking this medication.  His PSA  has been stable and was 4.3 last week.  There are no current plans for biopsy.  He is to follow up with Dr Jeffie Pollock in 1 year.  Plan - Follow up urology in 12 months.

## 2017-12-26 NOTE — Patient Instructions (Signed)
Thank you for coming to see me today. It was a pleasure. Today we talked about:   Diabetes, BPH, and blood pressure.  We are checking your A1c, PSA, and another anemia lab.  I'll let you know if we need to make any changes to your current medications.  Please follow-up with [place] in 3 months.   If you have any questions or concerns, please do not hesitate to call the office at [phone].  Take Care,   Jule Ser, DO

## 2017-12-26 NOTE — Assessment & Plan Note (Signed)
Diagnosed via biopsy from EGD for work up of his anemia and weight loss.  He is currently being treated with bismuth, doxycycline, metronidazole and PPI for 14 days and has 1 or 2 days or therapy left.  Given his history of anemia, B12 deficiency, and H pylori we were going to send labs for anti-parietal cell antibodies, however, patient left clinic without obtaining labs.  Will defer this work up at this time until follow up in 3 months if needed.

## 2017-12-26 NOTE — Assessment & Plan Note (Signed)
BP Readings from Last 3 Encounters:  12/26/17 (!) 160/77  11/29/17 (!) 152/82  11/26/17 (!) 163/83   His BP has been elevated on recent evaluation.  His amlodipine was decreased to 5mg  daily back in January and his Lisinopril was continued at the current dose of 40mg  daily.  This was in the setting of orthostatic hypotension resulting in syncope.  Given this risk, I think it is reasonable to continue the current regimen and tolerate a slightly elevated BP to avoid recurrence of syncope.  Plan: - Continue Lisinopril 40mg  daily and Amlodipine 5mg  daily. - RTC 3 months

## 2017-12-26 NOTE — Progress Notes (Signed)
Medicine attending: Medical history, presenting problems, physical findings, and medications, reviewed with resident physician Dr Andrew Wallace on the day of the patient visit and I concur with his evaluation and management plan. 

## 2017-12-26 NOTE — Assessment & Plan Note (Signed)
A1c today is stable on metformin 1000mg  BID.  No complaints of polyuria or polydipsia.  Plan: - COntinue metformin 1000mg  BID - RTC 3 months.

## 2017-12-26 NOTE — Assessment & Plan Note (Signed)
Seen by urology actually 1 week ago.  He has been stable off Tamsulosin without LUTS at this time.  His urologist instructed him to continue not taking this medication.  His PSA has been stable and was 4.3 last week.  There are no current plans for biopsy.  He is to follow up with Dr Jeffie Pollock in 1 year.  Plan - Follow up urology in 12 months.

## 2017-12-30 ENCOUNTER — Encounter: Payer: Self-pay | Admitting: Gastroenterology

## 2018-01-04 ENCOUNTER — Encounter: Payer: Self-pay | Admitting: *Deleted

## 2018-01-06 DIAGNOSIS — H4312 Vitreous hemorrhage, left eye: Secondary | ICD-10-CM | POA: Diagnosis not present

## 2018-01-06 DIAGNOSIS — E113512 Type 2 diabetes mellitus with proliferative diabetic retinopathy with macular edema, left eye: Secondary | ICD-10-CM | POA: Diagnosis not present

## 2018-01-06 DIAGNOSIS — H4051X3 Glaucoma secondary to other eye disorders, right eye, severe stage: Secondary | ICD-10-CM | POA: Diagnosis not present

## 2018-01-06 DIAGNOSIS — H35372 Puckering of macula, left eye: Secondary | ICD-10-CM | POA: Diagnosis not present

## 2018-01-21 ENCOUNTER — Encounter: Payer: Self-pay | Admitting: Gastroenterology

## 2018-01-28 ENCOUNTER — Other Ambulatory Visit: Payer: Medicare PPO

## 2018-01-30 ENCOUNTER — Other Ambulatory Visit (INDEPENDENT_AMBULATORY_CARE_PROVIDER_SITE_OTHER): Payer: Medicare PPO

## 2018-01-30 DIAGNOSIS — D649 Anemia, unspecified: Secondary | ICD-10-CM

## 2018-01-31 LAB — CBC WITH DIFFERENTIAL/PLATELET
Basophils Absolute: 0 10*3/uL (ref 0.0–0.2)
Basos: 0 %
EOS (ABSOLUTE): 0.1 10*3/uL (ref 0.0–0.4)
Eos: 1 %
Hematocrit: 28.4 % — ABNORMAL LOW (ref 37.5–51.0)
Hemoglobin: 9.6 g/dL — ABNORMAL LOW (ref 13.0–17.7)
Immature Grans (Abs): 0 10*3/uL (ref 0.0–0.1)
Immature Granulocytes: 0 %
Lymphocytes Absolute: 2.6 10*3/uL (ref 0.7–3.1)
Lymphs: 32 %
MCH: 30.4 pg (ref 26.6–33.0)
MCHC: 33.8 g/dL (ref 31.5–35.7)
MCV: 90 fL (ref 79–97)
Monocytes Absolute: 0.5 10*3/uL (ref 0.1–0.9)
Monocytes: 6 %
Neutrophils Absolute: 5.1 10*3/uL (ref 1.4–7.0)
Neutrophils: 61 %
Platelets: 312 10*3/uL (ref 150–450)
RBC: 3.16 x10E6/uL — ABNORMAL LOW (ref 4.14–5.80)
RDW: 14.3 % (ref 12.3–15.4)
WBC: 8.2 10*3/uL (ref 3.4–10.8)

## 2018-01-31 LAB — RETICULOCYTES: Retic Ct Pct: 1.9 % (ref 0.6–2.6)

## 2018-02-06 ENCOUNTER — Telehealth: Payer: Self-pay | Admitting: *Deleted

## 2018-02-06 NOTE — Telephone Encounter (Signed)
Called pt - no answer; left message to give me a call back. 

## 2018-02-06 NOTE — Telephone Encounter (Signed)
-----   Message from Annia Belt, MD sent at 02/05/2018 10:37 AM EDT ----- Call pt: blood count slightly lower than the one we did in March but in the range of previous values. We still do not have a good explanation for your anemia.  Glenda - does he have a follow up scheduled with me?

## 2018-02-07 NOTE — Telephone Encounter (Signed)
Called pt again - no answer; left message to call the office. Also called his girlfriend's # - left message.

## 2018-02-07 NOTE — Telephone Encounter (Signed)
-----   Message from Dennis Belt, MD sent at 02/05/2018 10:37 AM EDT ----- Call pt: blood count slightly lower than the one we did in March but in the range of previous values. We still do not have a good explanation for your anemia.  [name] - does he have a follow up scheduled with me?

## 2018-02-10 NOTE — Telephone Encounter (Signed)
Called pt again - no answer; left message to call me back.  Per Epic, pt has an appt on 8/19 @ 0945 AM with Dr Darnell Level.

## 2018-02-10 NOTE — Telephone Encounter (Signed)
Pt calling back regarding results; pt contact# [phone]

## 2018-02-11 NOTE — Telephone Encounter (Signed)
Noted. thx 

## 2018-02-11 NOTE — Telephone Encounter (Signed)
Called pt - informed "blood count slightly lower than the one we did in March but in the range of previous values. We still do not have a good explanation for your anemia." per Dr Beryle Beams. Informed pt an appt has been scheduled on Aug 19 @ 0945 AM with Dr Darnell Level. Stated he has check with his daughter to see if she can provide transportation; and call back if he needs to change this appt.

## 2018-02-13 ENCOUNTER — Other Ambulatory Visit: Payer: Self-pay

## 2018-02-13 ENCOUNTER — Encounter: Payer: Self-pay | Admitting: Internal Medicine

## 2018-02-13 ENCOUNTER — Ambulatory Visit (INDEPENDENT_AMBULATORY_CARE_PROVIDER_SITE_OTHER): Payer: Medicare PPO | Admitting: Internal Medicine

## 2018-02-13 DIAGNOSIS — K2970 Gastritis, unspecified, without bleeding: Secondary | ICD-10-CM | POA: Diagnosis not present

## 2018-02-13 DIAGNOSIS — Z79899 Other long term (current) drug therapy: Secondary | ICD-10-CM

## 2018-02-13 DIAGNOSIS — E11319 Type 2 diabetes mellitus with unspecified diabetic retinopathy without macular edema: Secondary | ICD-10-CM | POA: Diagnosis not present

## 2018-02-13 DIAGNOSIS — B9681 Helicobacter pylori [H. pylori] as the cause of diseases classified elsewhere: Secondary | ICD-10-CM | POA: Diagnosis not present

## 2018-02-13 DIAGNOSIS — I10 Essential (primary) hypertension: Secondary | ICD-10-CM | POA: Diagnosis not present

## 2018-02-13 DIAGNOSIS — Z7984 Long term (current) use of oral hypoglycemic drugs: Secondary | ICD-10-CM

## 2018-02-13 LAB — GLUCOSE, CAPILLARY: Glucose-Capillary: 154 mg/dL — ABNORMAL HIGH (ref 70–99)

## 2018-02-13 NOTE — Assessment & Plan Note (Addendum)
147/80 today. Compliant with Amlodipine 5mg  and Lisinopril 40mg . Denies headache, blurry vision. History of orthostatic hypotension and syncope in Jan 2019. For this reason, we will allow a slightly elevated blood pressure.   - Continue amlodipine 5mg  and Lisinopril 40mg 

## 2018-02-13 NOTE — Assessment & Plan Note (Signed)
He has completed treatment for H. Pylori. Denies n/v/d, heart burn, abdominal pain. His appetite is okay, eating two small meals a day. He has gained 8 lbs in the last 3 months. Considered testing for anti-parietal cell antibodies but I don't see how this will change his management. He is receiving B12 supplementation and his anemia is normocytic. Recent EGD showed no signs of gastric cancer and he is scheduled for a colonoscopy this month.

## 2018-02-13 NOTE — Patient Instructions (Signed)
It was nice seeing you today. Thank you for choosing [facility] for your Primary Care.   You're doing well! You are on top of everything. Come back around Thanksgiving or Christmas and we'll check on your diabets.    FOLLOW-UP INSTRUCTIONS When: 3-4 months For: diabetes What to bring:   Please contact the clinic if you have any problems, or need to be seen sooner.

## 2018-02-13 NOTE — Assessment & Plan Note (Addendum)
Adherent to metformin. Last a1c 12/2017 was 6.3. Reports seeing his eye doctor last month, but I do not have records of this yet. Foot exam today. He checks his sugars occasionally and they range from 92-238 with 80% in target and 20% above target. Average reading is 133. Denies episodes of shakiness, confusion, light headedness. Does endorse diaphoresis but that is triggered by certain foods.   - continue metformin 1,000mg  BID - Can check a1c q6 months - f/u 3-4 months for repeat a1c

## 2018-02-13 NOTE — Progress Notes (Signed)
   CC: diabetes  HPI:  [name] is a 67 y.o. male with hypertension, diabetes, BPH, HLD, H pylori, anemia, weight loss who presents for management of chronic conditions. He is accompanied today by his two granddaughters   Please see encounter tab for full details of HPI.  Past Medical History:  Diagnosis Date  . Anemia   . Blindness    right eye  . Dehydration   . Diabetes ([place])   . Glaucoma   . History of urinary retention   . Hyperlipidemia   . Hypertension   . Stroke [facility])    2017- March  . Tachycardia 08/26/2017  . Tubular adenoma of colon 02/2017  . Weight loss, non-intentional 08/26/2017   10 lbs between 6/18 & 2/19    Physical Exam:  Vitals:   02/13/18 1337  BP: (!) 147/80  Pulse: 76  Temp: 99.5 F (37.5 C)  TempSrc: Oral  SpO2: 100%  Weight: 165 lb (74.8 kg)  Height: 6' (1.829 m)   Gen: Well appearing, NAD CV: RRR, no murmurs Pulm: Normal effort, CTA throughout, no wheezing Ext: Warm, no edema, normal joints Foot exam: DP pulses palpable R>L, right great toe onychomycosis, no ulcers   Assessment & Plan:   See Encounters Tab for problem based charting.  Patient seen with Dr. Evette Doffing

## 2018-02-18 NOTE — Addendum Note (Signed)
Addended by: Lalla Brothers T on: 02/18/2018 02:16 PM   Modules accepted: Level of Service

## 2018-02-18 NOTE — Progress Notes (Signed)
Internal Medicine Clinic Attending  I saw and evaluated the patient.  I personally confirmed the key portions of the history and exam documented by Dr. Vogel and I reviewed pertinent patient test results.  The assessment, diagnosis, and plan were formulated together and I agree with the documentation in the resident's note.  

## 2018-02-19 DIAGNOSIS — H4051X3 Glaucoma secondary to other eye disorders, right eye, severe stage: Secondary | ICD-10-CM | POA: Diagnosis not present

## 2018-02-19 DIAGNOSIS — H4312 Vitreous hemorrhage, left eye: Secondary | ICD-10-CM | POA: Diagnosis not present

## 2018-02-19 DIAGNOSIS — H35372 Puckering of macula, left eye: Secondary | ICD-10-CM | POA: Diagnosis not present

## 2018-02-19 DIAGNOSIS — E113512 Type 2 diabetes mellitus with proliferative diabetic retinopathy with macular edema, left eye: Secondary | ICD-10-CM | POA: Diagnosis not present

## 2018-02-19 LAB — HM DIABETES EYE EXAM

## 2018-02-21 ENCOUNTER — Encounter: Payer: Self-pay | Admitting: *Deleted

## 2018-02-21 ENCOUNTER — Telehealth: Payer: Self-pay | Admitting: Internal Medicine

## 2018-02-21 NOTE — Telephone Encounter (Signed)
Called [facility].  Patient last seen on 02/19/2018.  Records Rec'd and given to [employer]

## 2018-02-24 ENCOUNTER — Other Ambulatory Visit: Payer: Self-pay

## 2018-02-24 ENCOUNTER — Ambulatory Visit (INDEPENDENT_AMBULATORY_CARE_PROVIDER_SITE_OTHER): Payer: Medicare PPO | Admitting: Oncology

## 2018-02-24 ENCOUNTER — Encounter: Payer: Self-pay | Admitting: Oncology

## 2018-02-24 VITALS — BP 171/78 | HR 70 | Temp 98.5°F | Ht 72.0 in | Wt 167.6 lb

## 2018-02-24 DIAGNOSIS — Z8619 Personal history of other infectious and parasitic diseases: Secondary | ICD-10-CM | POA: Diagnosis not present

## 2018-02-24 DIAGNOSIS — Z87438 Personal history of other diseases of male genital organs: Secondary | ICD-10-CM

## 2018-02-24 DIAGNOSIS — Z8719 Personal history of other diseases of the digestive system: Secondary | ICD-10-CM

## 2018-02-24 DIAGNOSIS — D649 Anemia, unspecified: Secondary | ICD-10-CM | POA: Diagnosis not present

## 2018-02-24 DIAGNOSIS — D510 Vitamin B12 deficiency anemia due to intrinsic factor deficiency: Secondary | ICD-10-CM

## 2018-02-24 DIAGNOSIS — H5461 Unqualified visual loss, right eye, normal vision left eye: Secondary | ICD-10-CM | POA: Diagnosis not present

## 2018-02-24 HISTORY — DX: Vitamin B12 deficiency anemia due to intrinsic factor deficiency: D51.0

## 2018-02-24 LAB — SAVE SMEAR

## 2018-02-24 NOTE — Progress Notes (Signed)
Hematology and Oncology Follow Up Visit  Dennis Macias [id number] October 27, 1950 67 y.o. 02/24/2018 7:28 PM   Principle Diagnosis: Encounter Diagnosis  Name Primary?  . Pernicious anemia Yes  Clinical summary: 67 year old man who developed a sudden unexplained anemia with concomitant weight loss. He developed an abrupt fall in his hemoglobin which was recorded as 15.8 on September 12, 2015 down to 10.5 on October 03, 2016. We have no recorded CBCs prior to March 2017 and no interim hemoglobins between March 2017 and March 2018. Hemoglobins have remained low but stable at or above 10 g over last year with value on September 24, 2017 10.7 with MCV 92. He has a normal white count, differential, and platelet count. No red flags seen on review of the peripheral blood film. A myeloma screen was negativewith normal quantitative immunoglobulins, no monoclonal protein seen on immunofixation electrophoresis, and a normal kappa/lambda light chain ratio.  There is no evidence for a hemolytic process.Reticulocyte count 1.4%. LDH 199. Urine negative for blood,bilirubin, and hemoglobin. Vitamin studies with normal O17 and folic acid but elevated methylmalonic acid. Erythropoietin level mid range normal at 7. TSH normal 2.9 ESR 2 mm PSA borderline elevated 5.3 and urology involved. HIV screen was negative in March 2017, hepatitis C negative, Bilirubin not recorded since March 2017 when it was normal at 1.0. Liver functions normal at that time. Ferritin increased 412 normal up to 400 June 2018. Bone marrow March 14, 2019normocellular, no evidence for myelodysplasia, no lymphoid infiltrates, no plasma cell infiltrates. No abnormal monoclonal population on flow cytometry. Colonoscopy normal March 06, 2017 He has never had an upper endoscopy but has no GI symptoms. Denies dysphagia. Appetite is poor. Current weight 157 pounds. Weight 1 year ago 172 pounds  Interim History: Ongoing evaluation for idiopathic  normochromic anemia.  At time of March 2019 visit he had a borderline B12 level.  Methylmalonic acid was checked 1 year ago in August 2018 and was elevated.  Repeat value on September 24, 2017 progressively increased.  He was started on oral B12.  Some initial misunderstanding about what preparation he was supposed to take.  Now on daily B12 supplements. His weight has stabilized but he is using nutritional supplements on a daily basis.  His main complaint today is profuse sweating of his scalp whenever he eats hot food.  No drenching night sweats. Since last visit he did have upper endoscopy on May 24 which showed: Normal esophagus. - Gastritis. Biopsied. - A single gastric polyp. Resected and retrieved. - Two non-bleeding angiodysplastic lesions in the stomach. - Normal duodenal bulb and second portion of the duodenum. Pathology showed severe gastritis with ulceration and positive for Helicobacter organisms.  No malignant change. His H. pylori was treated.  Medications: reviewed  Allergies: No Known Allergies  Review of Systems: See interim history Remaining ROS negative:   Physical Exam: Blood pressure (!) 171/78, pulse 70, temperature 98.5 F (36.9 C), temperature source Oral, height 6' (1.829 m), weight 167 lb 9.6 oz (76 kg), SpO2 100 %. Wt Readings from Last 3 Encounters:  02/24/18 167 lb 9.6 oz (76 kg)  02/13/18 165 lb (74.8 kg)  12/26/17 167 lb 9.6 oz (76 kg)     General appearance: Thin African-American/Native American man HENNT: Pharynx no erythema, exudate, mass, or ulcer. No thyromegaly or thyroid nodules Lymph nodes: No cervical, supraclavicular, or axillary lymphadenopathy Breasts: Lungs: Clear to auscultation, resonant to percussion throughout Heart: Regular rhythm, no murmur, no gallop, no rub, no click, no edema Abdomen:  Soft, nontender, normal bowel sounds, no mass, no organomegaly Extremities: No edema, no calf tenderness Musculoskeletal: no joint deformities GU:   Vascular: Carotid pulses 2+, no bruits, distal pulses: Dorsalis pedis 1+ symmetric Neurologic: Alert, oriented, blind right eye.  Pupil 3 mm greater than the left and not reactive., optic discs not visualized., cranial nerves grossly normal, motor strength 5 over 5, reflexes 1+ symmetric, upper body coordination normal, gait normal, Skin: No rash or ecchymosis  Lab Results: CBC W/Diff    Component Value Date/Time   WBC 8.2 01/30/2018 1050   WBC 9.5 09/19/2017 0730   RBC 3.16 (L) 01/30/2018 1050   RBC 3.43 (L) 09/19/2017 0730   HGB 9.6 (L) 01/30/2018 1050   HCT 28.4 (L) 01/30/2018 1050   PLT 312 01/30/2018 1050   MCV 90 01/30/2018 1050   MCH 30.4 01/30/2018 1050   MCH 30.9 09/19/2017 0730   MCHC 33.8 01/30/2018 1050   MCHC 34.1 09/19/2017 0730   RDW 14.3 01/30/2018 1050   LYMPHSABS 2.6 01/30/2018 1050   MONOABS 0.4 09/19/2017 0730   EOSABS 0.1 01/30/2018 1050   BASOSABS 0.0 01/30/2018 1050     Chemistry      Component Value Date/Time   NA 141 11/05/2017 1126   NA 139 02/26/2017 0942   K 3.9 11/05/2017 1126   CL 106 11/05/2017 1126   CO2 28 11/05/2017 1126   BUN 12 11/05/2017 1126   BUN 9 02/26/2017 0942   CREATININE 0.93 11/05/2017 1126      Component Value Date/Time   CALCIUM 8.7 11/05/2017 1126   ALKPHOS 66 09/12/2015 1347   AST 13 (L) 09/12/2015 1347   ALT 10 (L) 09/12/2015 1347   BILITOT 1.0 09/12/2015 1347    Repeat CBC pending   Radiological Studies: No results found.  Impression: 1.  Normochromic anemia Likely related to previously untreated ulcerative gastritis from Helicobacter infection.  2.  Pernicious anemia Hopefully temporary situation related to #1. I am repeating methylmalonic acid today.  I did not see that he had received treatment for Helicobacter until after he left the office so I did send off lab for anti-intrinsic factor and antiparietal cell antibodies which if positive would make me change his oral B12 to parenteral.  3.  History of  elevated PSA  CC: Patient Care Team: Isabelle Course, MD as PCP - General Beryle Beams, Alyson Locket, MD as Consulting Physician (Oncology) Ladene Artist, MD as Consulting Physician (Gastroenterology) Zadie Rhine Clent Demark, MD as Consulting Physician (Ophthalmology)   Murriel Hopper, MD, [place]  Hematology-Oncology/Internal Medicine     8/19/20197:28 PM

## 2018-02-24 NOTE — Patient Instructions (Signed)
To lab today Return visit 4-6 weeks

## 2018-02-26 ENCOUNTER — Ambulatory Visit (AMBULATORY_SURGERY_CENTER): Payer: Self-pay | Admitting: *Deleted

## 2018-02-26 ENCOUNTER — Encounter: Payer: Self-pay | Admitting: Gastroenterology

## 2018-02-26 VITALS — Ht 72.0 in | Wt 167.2 lb

## 2018-02-26 DIAGNOSIS — H401113 Primary open-angle glaucoma, right eye, severe stage: Secondary | ICD-10-CM | POA: Diagnosis not present

## 2018-02-26 DIAGNOSIS — H401121 Primary open-angle glaucoma, left eye, mild stage: Secondary | ICD-10-CM | POA: Diagnosis not present

## 2018-02-26 DIAGNOSIS — Z961 Presence of intraocular lens: Secondary | ICD-10-CM | POA: Diagnosis not present

## 2018-02-26 DIAGNOSIS — Z86010 Personal history of colonic polyps: Secondary | ICD-10-CM

## 2018-02-26 LAB — COMPREHENSIVE METABOLIC PANEL
ALT: 12 IU/L (ref 0–44)
AST: 16 IU/L (ref 0–40)
Albumin/Globulin Ratio: 1.2 (ref 1.2–2.2)
Albumin: 3 g/dL — ABNORMAL LOW (ref 3.6–4.8)
Alkaline Phosphatase: 72 IU/L (ref 39–117)
BUN/Creatinine Ratio: 10 (ref 10–24)
BUN: 13 mg/dL (ref 8–27)
Bilirubin Total: 0.4 mg/dL (ref 0.0–1.2)
CO2: 22 mmol/L (ref 20–29)
Calcium: 8.8 mg/dL (ref 8.6–10.2)
Chloride: 107 mmol/L — ABNORMAL HIGH (ref 96–106)
Creatinine, Ser: 1.27 mg/dL (ref 0.76–1.27)
GFR calc Af Amer: 68 mL/min/{1.73_m2} (ref >59)
GFR calc non Af Amer: 58 mL/min/{1.73_m2} — ABNORMAL LOW (ref >59)
Globulin, Total: 2.5 g/dL (ref 1.5–4.5)
Glucose: 146 mg/dL — ABNORMAL HIGH (ref 65–99)
Potassium: 4.4 mmol/L (ref 3.5–5.2)
Sodium: 143 mmol/L (ref 134–144)
Total Protein: 5.5 g/dL — ABNORMAL LOW (ref 6.0–8.5)

## 2018-02-26 LAB — CBC WITH DIFFERENTIAL/PLATELET
Basophils Absolute: 0 10*3/uL (ref 0.0–0.2)
Basos: 0 %
EOS (ABSOLUTE): 0.1 10*3/uL (ref 0.0–0.4)
Eos: 1 %
Hematocrit: 30 % — ABNORMAL LOW (ref 37.5–51.0)
Hemoglobin: 10.3 g/dL — ABNORMAL LOW (ref 13.0–17.7)
Immature Grans (Abs): 0 10*3/uL (ref 0.0–0.1)
Immature Granulocytes: 1 %
Lymphocytes Absolute: 2 10*3/uL (ref 0.7–3.1)
Lymphs: 27 %
MCH: 30.2 pg (ref 26.6–33.0)
MCHC: 34.3 g/dL (ref 31.5–35.7)
MCV: 88 fL (ref 79–97)
Monocytes Absolute: 0.5 10*3/uL (ref 0.1–0.9)
Monocytes: 6 %
Neutrophils Absolute: 4.6 10*3/uL (ref 1.4–7.0)
Neutrophils: 65 %
Platelets: 354 10*3/uL (ref 150–450)
RBC: 3.41 x10E6/uL — ABNORMAL LOW (ref 4.14–5.80)
RDW: 14 % (ref 12.3–15.4)
WBC: 7.2 10*3/uL (ref 3.4–10.8)

## 2018-02-26 LAB — ANTI-PARIETAL ANTIBODY: Parietal Cell Ab: 1.6 Units (ref 0.0–20.0)

## 2018-02-26 LAB — INTRINSIC FACTOR ANTIBODIES: Intrinsic Factor Abs, Serum: 1 AU/mL (ref 0.0–1.1)

## 2018-02-26 LAB — METHYLMALONIC ACID, SERUM: Methylmalonic Acid: 422 nmol/L — ABNORMAL HIGH (ref 0–378)

## 2018-02-26 LAB — RETICULOCYTES: Retic Ct Pct: 1.4 % (ref 0.6–2.6)

## 2018-02-26 LAB — LACTATE DEHYDROGENASE: LDH: 270 IU/L — ABNORMAL HIGH (ref 121–224)

## 2018-02-26 MED ORDER — NA SULFATE-K SULFATE-MG SULF 17.5-3.13-1.6 GM/177ML PO SOLN: 1.0000 | Freq: Once | ORAL | 0 refills | Status: AC

## 2018-02-26 NOTE — Progress Notes (Signed)
No egg or soy allergy known to patient  No issues with past sedation with any surgeries  or procedures, no intubation problems  No diet pills per patient No home 02 use per patient  No blood thinners per patient  Pt denies issues with constipation  No A fib or A flutter  EMMI video sent to pt's e mail - pt declined  [employer] will not cover Suprep- Per [place] is $102.00- pt and wife states they cannot afford this- Suprep Sample to pt- Lot [id number]  Exp 7-21 as directed

## 2018-03-12 ENCOUNTER — Ambulatory Visit (AMBULATORY_SURGERY_CENTER): Payer: Medicare PPO | Admitting: Gastroenterology

## 2018-03-12 ENCOUNTER — Encounter: Payer: Self-pay | Admitting: Gastroenterology

## 2018-03-12 VITALS — BP 161/77 | HR 69 | Temp 98.0°F | Resp 15 | Ht 72.0 in | Wt 165.0 lb

## 2018-03-12 DIAGNOSIS — K635 Polyp of colon: Secondary | ICD-10-CM

## 2018-03-12 DIAGNOSIS — Z86010 Personal history of colonic polyps: Secondary | ICD-10-CM | POA: Diagnosis not present

## 2018-03-12 DIAGNOSIS — D125 Benign neoplasm of sigmoid colon: Secondary | ICD-10-CM | POA: Diagnosis not present

## 2018-03-12 DIAGNOSIS — D120 Benign neoplasm of cecum: Secondary | ICD-10-CM | POA: Diagnosis not present

## 2018-03-12 DIAGNOSIS — Z1211 Encounter for screening for malignant neoplasm of colon: Secondary | ICD-10-CM | POA: Diagnosis not present

## 2018-03-12 MED ORDER — SODIUM CHLORIDE 0.9 % IV SOLN: 500.0000 mL | Freq: Once | INTRAVENOUS | Status: DC

## 2018-03-12 NOTE — Progress Notes (Signed)
Pt's states no medical or surgical changes since previsit or office visit. 

## 2018-03-12 NOTE — Op Note (Signed)
[facility] Patient Name: Dennis Macias Procedure Date: 03/12/2018 2:30 PM MRN: [id number] Endoscopist: Ladene Artist , MD Age: 67 Referring MD:  Date of Birth: 11/15/1950 Gender: Male Account #: [id number] Procedure:                Colonoscopy Indications:              Surveillance: Personal history of piecemeal removal                            of adenoma on last colonoscopy (less than 1 year                            ago) Medicines:                Monitored Anesthesia Care Procedure:                Pre-Anesthesia Assessment:                           - Prior to the procedure, a History and Physical                            was performed, and patient medications and                            allergies were reviewed. The patient's tolerance of                            previous anesthesia was also reviewed. The risks                            and benefits of the procedure and the sedation                            options and risks were discussed with the patient.                            All questions were answered, and informed consent                            was obtained. Prior Anticoagulants: The patient has                            taken no previous anticoagulant or antiplatelet                            agents. ASA Grade Assessment: II - A patient with                            mild systemic disease. After reviewing the risks                            and benefits, the patient was deemed in  satisfactory condition to undergo the procedure.                           After obtaining informed consent, the colonoscope                            was passed under direct vision. Throughout the                            procedure, the patient's blood pressure, pulse, and                            oxygen saturations were monitored continuously. The                            Colonoscope was introduced through the anus and                   advanced to the the cecum, identified by                            appendiceal orifice and ileocecal valve. The                            ileocecal valve, appendiceal orifice, and rectum                            were photographed. The quality of the bowel                            preparation was good. Unable to retroflex in the                            rectum. The colonoscopy was performed without                            difficulty without using air insufflation on                            insertion and with an adult colonoscope. The                            patient tolerated the procedure well. Scope In: 2:39:34 PM Scope Out: 3:03:20 PM Scope Withdrawal Time: 0 hours 15 minutes 43 seconds  Total Procedure Duration: 0 hours 23 minutes 46 seconds  Findings:                 The perianal and digital rectal examinations were                            normal.                           Two sessile polyps were found in the sigmoid colon  and ileocecal valve. The polyps were 7 mm in size.                            These polyps were removed with a cold snare.                            Resection and retrieval were complete.                           A small post polypectomy scar was found in the                            rectum. There was no evidence of the previous polyp.                           The exam was otherwise without abnormality on                            direct views. Complications:            No immediate complications. Estimated blood loss:                            None. Estimated Blood Loss:     Estimated blood loss: none. Impression:               - Two 7 mm polyps in the sigmoid colon and at the                            ileocecal valve, removed with a cold snare.                            Resected and retrieved.                           - Post-polypectomy scar in the rectum.                           - The  examination was otherwise normal on direct                            views. Recommendation:           - Repeat colonoscopy in 3 years for surveillance                            with an extended bowel prep.                           - Patient has a contact number available for                            emergencies. The signs and symptoms of potential                            delayed  complications were discussed with the                            patient. Return to normal activities tomorrow.                            Written discharge instructions were provided to the                            patient.                           - Resume previous diet.                           - Continue present medications.                           - Await pathology results. Ladene Artist, MD 03/12/2018 3:10:38 PM This report has been signed electronically.

## 2018-03-12 NOTE — Progress Notes (Signed)
Called to room to assist during endoscopic procedure.  Patient ID and intended procedure confirmed with present staff. Received instructions for my participation in the procedure from the performing physician.  

## 2018-03-12 NOTE — Patient Instructions (Signed)
Handout given on polyps  YOU HAD AN ENDOSCOPIC PROCEDURE TODAY AT THE [facility]:   Refer to the procedure report that was given to you for any specific questions about what was found during the examination.  If the procedure report does not answer your questions, please call your gastroenterologist to clarify.  If you requested that your care partner not be given the details of your procedure findings, then the procedure report has been included in a sealed envelope for you to review at your convenience later.  YOU SHOULD EXPECT: Some feelings of bloating in the abdomen. Passage of more gas than usual.  Walking can help get rid of the air that was put into your GI tract during the procedure and reduce the bloating. If you had a lower endoscopy (such as a colonoscopy or flexible sigmoidoscopy) you may notice spotting of blood in your stool or on the toilet paper. If you underwent a bowel prep for your procedure, you may not have a normal bowel movement for a few days.  Please Note:  You might notice some irritation and congestion in your nose or some drainage.  This is from the oxygen used during your procedure.  There is no need for concern and it should clear up in a day or so.  SYMPTOMS TO REPORT IMMEDIATELY:   Following lower endoscopy (colonoscopy or flexible sigmoidoscopy):  Excessive amounts of blood in the stool  Significant tenderness or worsening of abdominal pains  Swelling of the abdomen that is new, acute  Fever of 100F or higher    For urgent or emergent issues, a gastroenterologist can be reached at any hour by calling [phone].   DIET:  We do recommend a small meal at first, but then you may proceed to your regular diet.  Drink plenty of fluids but you should avoid alcoholic beverages for 24 hours.  ACTIVITY:  You should plan to take it easy for the rest of today and you should NOT DRIVE or use heavy machinery until tomorrow (because of the sedation  medicines used during the test).    FOLLOW UP: Our staff will call the number listed on your records the next business day following your procedure to check on you and address any questions or concerns that you may have regarding the information given to you following your procedure. If we do not reach you, we will leave a message.  However, if you are feeling well and you are not experiencing any problems, there is no need to return our call.  We will assume that you have returned to your regular daily activities without incident.  If any biopsies were taken you will be contacted by phone or by letter within the next 1-3 weeks.  Please call us at [phone] if you have not heard about the biopsies in 3 weeks.    SIGNATURES/CONFIDENTIALITY: You and/or your care partner have signed paperwork which will be entered into your electronic medical record.  These signatures attest to the fact that that the information above on your After Visit Summary has been reviewed and is understood.  Full responsibility of the confidentiality of this discharge information lies with you and/or your care-partner. 

## 2018-03-12 NOTE — Progress Notes (Signed)
To PACU, VSS. Report to [email or web address] 

## 2018-03-13 ENCOUNTER — Telehealth: Payer: Self-pay

## 2018-03-13 NOTE — Telephone Encounter (Signed)
  Follow up Call-  Call back number 03/12/2018 11/29/2017 03/06/2017  Post procedure Call Back phone  # [phone] hm [phone]  [phone]  Permission to leave phone message Yes Yes Yes  Some recent data might be hidden     Patient questions:  Do you have a fever, pain , or abdominal swelling? No. Pain Score  0 *  Have you tolerated food without any problems? Yes.    Have you been able to return to your normal activities? Yes.    Do you have any questions about your discharge instructions: Diet   No. Medications  No. Follow up visit  No.  Do you have questions or concerns about your Care? No.  Actions: * If pain score is 4 or above: No action needed, pain <4.

## 2018-03-13 NOTE — Telephone Encounter (Signed)
Left message for follow up call.

## 2018-03-21 ENCOUNTER — Encounter: Payer: Self-pay | Admitting: Gastroenterology

## 2018-04-01 DIAGNOSIS — H4312 Vitreous hemorrhage, left eye: Secondary | ICD-10-CM | POA: Diagnosis not present

## 2018-04-01 DIAGNOSIS — H3562 Retinal hemorrhage, left eye: Secondary | ICD-10-CM | POA: Diagnosis not present

## 2018-04-01 DIAGNOSIS — H35372 Puckering of macula, left eye: Secondary | ICD-10-CM | POA: Diagnosis not present

## 2018-04-01 DIAGNOSIS — E113512 Type 2 diabetes mellitus with proliferative diabetic retinopathy with macular edema, left eye: Secondary | ICD-10-CM | POA: Diagnosis not present

## 2018-04-03 ENCOUNTER — Encounter: Payer: Medicare PPO | Admitting: Internal Medicine

## 2018-04-23 DIAGNOSIS — E113512 Type 2 diabetes mellitus with proliferative diabetic retinopathy with macular edema, left eye: Secondary | ICD-10-CM | POA: Diagnosis not present

## 2018-05-15 DIAGNOSIS — E113513 Type 2 diabetes mellitus with proliferative diabetic retinopathy with macular edema, bilateral: Secondary | ICD-10-CM | POA: Diagnosis not present

## 2018-05-15 DIAGNOSIS — E113512 Type 2 diabetes mellitus with proliferative diabetic retinopathy with macular edema, left eye: Secondary | ICD-10-CM | POA: Diagnosis not present

## 2018-05-15 DIAGNOSIS — H35372 Puckering of macula, left eye: Secondary | ICD-10-CM | POA: Diagnosis not present

## 2018-05-15 DIAGNOSIS — H4312 Vitreous hemorrhage, left eye: Secondary | ICD-10-CM | POA: Diagnosis not present

## 2018-05-21 ENCOUNTER — Other Ambulatory Visit: Payer: Self-pay | Admitting: Interventional Cardiology

## 2018-05-23 ENCOUNTER — Other Ambulatory Visit: Payer: Self-pay | Admitting: Internal Medicine

## 2018-05-23 NOTE — Telephone Encounter (Signed)
Called pt - no answer; left message Amlodipine was refilled yesterday by Dr Hassell Done office and to call [place]. And call back fort any questions.

## 2018-05-23 NOTE — Telephone Encounter (Signed)
Pt needs refills on amLODipine (NORVASC) 5 MG tablet at [facility], pt contact [phone]

## 2018-05-28 DIAGNOSIS — E113512 Type 2 diabetes mellitus with proliferative diabetic retinopathy with macular edema, left eye: Secondary | ICD-10-CM | POA: Diagnosis not present

## 2018-05-28 DIAGNOSIS — H35372 Puckering of macula, left eye: Secondary | ICD-10-CM | POA: Diagnosis not present

## 2018-05-28 DIAGNOSIS — H4312 Vitreous hemorrhage, left eye: Secondary | ICD-10-CM | POA: Diagnosis not present

## 2018-05-28 DIAGNOSIS — E113522 Type 2 diabetes mellitus with proliferative diabetic retinopathy with traction retinal detachment involving the macula, left eye: Secondary | ICD-10-CM | POA: Diagnosis not present

## 2018-06-04 DIAGNOSIS — Z09 Encounter for follow-up examination after completed treatment for conditions other than malignant neoplasm: Secondary | ICD-10-CM | POA: Diagnosis not present

## 2018-06-04 DIAGNOSIS — H4312 Vitreous hemorrhage, left eye: Secondary | ICD-10-CM | POA: Diagnosis not present

## 2018-06-04 DIAGNOSIS — E113512 Type 2 diabetes mellitus with proliferative diabetic retinopathy with macular edema, left eye: Secondary | ICD-10-CM | POA: Diagnosis not present

## 2018-07-16 DIAGNOSIS — E113512 Type 2 diabetes mellitus with proliferative diabetic retinopathy with macular edema, left eye: Secondary | ICD-10-CM | POA: Diagnosis not present

## 2018-07-16 DIAGNOSIS — Z09 Encounter for follow-up examination after completed treatment for conditions other than malignant neoplasm: Secondary | ICD-10-CM | POA: Diagnosis not present

## 2018-08-12 NOTE — Progress Notes (Signed)
   CC: diabetes management  HPI:  [name] is a 68 y.o. male with HTN, DM2, hld, BPH, chronic normocytic anemia who presents for f/u of diabetes and HTN. He would also like to discuss worsening diaphoresis. He is accompanied by his girlfriend who assists with the history.   Please see encounter tab for full details of HPI.   Past Medical History:  Diagnosis Date  . Anemia   . Arthritis    past hx   . Blindness    right eye  . Cataract    removed both eyes  . Dehydration   . Diabetes ([place])   . Glaucoma   . History of urinary retention   . Hyperlipidemia   . Hypertension   . Pernicious anemia 02/24/2018  . Stroke [facility])    2017- March  . Tachycardia 08/26/2017  . Tubular adenoma of colon 02/2017  . Weight loss, non-intentional 08/26/2017   10 lbs between 6/18 & 2/19    Physical Exam:  There were no vitals filed for this visit. Gen: sitting in chair, NAD. Accompanied by girlfriend Pulm: CTAB Neuro: strength 5/5 in bilateral lower extremities, sensation intact and symmetric Skin: no rashes or LAD  Assessment & Plan:   See Encounters Tab for problem based charting.  Patient discussed with Dr. Lynnae Macias

## 2018-08-13 ENCOUNTER — Encounter: Payer: Self-pay | Admitting: Internal Medicine

## 2018-08-13 ENCOUNTER — Ambulatory Visit (HOSPITAL_COMMUNITY)
Admission: RE | Admit: 2018-08-13 | Discharge: 2018-08-13 | Disposition: A | Discharge status: Home / Self Care | Payer: Medicare PPO | Source: Ambulatory Visit | Attending: Internal Medicine | Admitting: Internal Medicine

## 2018-08-13 ENCOUNTER — Other Ambulatory Visit: Payer: Self-pay

## 2018-08-13 ENCOUNTER — Ambulatory Visit (INDEPENDENT_AMBULATORY_CARE_PROVIDER_SITE_OTHER): Payer: Medicare PPO | Admitting: Internal Medicine

## 2018-08-13 VITALS — BP 169/90 | HR 78 | Temp 98.1°F | Ht 72.0 in | Wt 164.1 lb

## 2018-08-13 DIAGNOSIS — Z23 Encounter for immunization: Secondary | ICD-10-CM

## 2018-08-13 DIAGNOSIS — I10 Essential (primary) hypertension: Secondary | ICD-10-CM | POA: Diagnosis not present

## 2018-08-13 DIAGNOSIS — L74519 Primary focal hyperhidrosis, unspecified: Secondary | ICD-10-CM | POA: Diagnosis not present

## 2018-08-13 DIAGNOSIS — R61 Generalized hyperhidrosis: Secondary | ICD-10-CM | POA: Insufficient documentation

## 2018-08-13 DIAGNOSIS — E11319 Type 2 diabetes mellitus with unspecified diabetic retinopathy without macular edema: Secondary | ICD-10-CM

## 2018-08-13 DIAGNOSIS — R634 Abnormal weight loss: Secondary | ICD-10-CM | POA: Diagnosis not present

## 2018-08-13 DIAGNOSIS — R29898 Other symptoms and signs involving the musculoskeletal system: Secondary | ICD-10-CM | POA: Diagnosis not present

## 2018-08-13 LAB — POCT GLYCOSYLATED HEMOGLOBIN (HGB A1C): Hemoglobin A1C: 5.1 % (ref 4.0–5.6)

## 2018-08-13 LAB — GLUCOSE, CAPILLARY: Glucose-Capillary: 108 mg/dL — ABNORMAL HIGH (ref 70–99)

## 2018-08-13 MED ORDER — AMLODIPINE BESYLATE 5 MG PO TABS: 10.0000 mg | ORAL_TABLET | Freq: Every day | ORAL | 0 refills | Status: DC

## 2018-08-13 MED ORDER — ACCU-CHEK FASTCLIX LANCETS MISC: 3 refills | Status: DC

## 2018-08-13 MED ORDER — GLUCOSE BLOOD VI STRP: ORAL_STRIP | 3 refills | Status: DC

## 2018-08-13 MED ORDER — ACCU-CHEK GUIDE W/DEVICE KIT: 1.0000 | PACK | Freq: Every day | 0 refills | Status: DC

## 2018-08-13 NOTE — Progress Notes (Signed)
Internal Medicine Clinic Attending  Case discussed with Dr. Vogel  at the time of the visit.  We reviewed the resident's history and exam and pertinent patient test results.  I agree with the assessment, diagnosis, and plan of care documented in the resident's note.  

## 2018-08-13 NOTE — Assessment & Plan Note (Signed)
Reports new foot weakness noted over the last couple months. He has had one episode when he reports his right foot giving out on him and then his legs were too weak to get him up. Another episode during which he knelt down and his right foot gave out and he needed assistance standing back up. Denies associated pain, numbness, tingling. Once he gets back up he is fine. On exam, he has 5/5 strength in bilateral lower extremities and sensation is intact. No obvious foot deformity or skin changes/wounds. Recent labs show normal electrolytes.   Plan: - continue to monitor - if condition worsens/occure more frequently, will consider cane and PT.

## 2018-08-13 NOTE — Assessment & Plan Note (Signed)
Tolerating metformin. Up to date on eye and foot exams. His glucometer broke and he needs a new one. Denies symptomatic hypoglycemia.   Plan: - continue metformin 1,000mg  BID - repeat a1c today is 5.1, can check every year - order new glucometer

## 2018-08-13 NOTE — Patient Instructions (Signed)
It was nice seeing you today. Thank you for choosing [facility] for your Primary Care.   Today we talked about:  1) Sweating: I've ordered more blood work and a chest x-ray. I will call you in a couple weeks to discuss all these results 2) High blood pressure: please increase your amlodipine to 10mg  per day (that means you will take two pills instead of one)   FOLLOW-UP INSTRUCTIONS When: I will call you   Please contact the clinic if you have any problems, or need to be seen sooner.

## 2018-08-13 NOTE — Assessment & Plan Note (Addendum)
BP Readings from Last 3 Encounters:  08/13/18 (!) 169/90  03/12/18 (!) 161/77  02/24/18 (!) 171/78   Uncontrolled. Reports compliance with amlodipine 5mg  and lisinopril 40mg . Denies headache, vision changes, chest pain, sob.   Plan: - increase amlodipine to 10mg  daily

## 2018-08-13 NOTE — Assessment & Plan Note (Signed)
Ongoing for the last 1.5 years at least and worsening over the last couple months. The sweating is localized to his face and scalp. Initially, the sweating was only triggered by certain foods but now, it is triggered by anything he eats. He is not able to go out to eat because of this and he has decreased po intake because he doesn't want to make himself sweat. He walks around with towels on his neck and head to wipe the sweat away. He denies associated abdominal pain, diarrhea, night sweats, sob, chest pain, n/v, or palpitations. TSH and free T4 were previously checked and WNLs. CT a/p did not show any abnormalities. He has a history of elevated PSA and follows with urology but that has been stable. He has not had a recent CXR. He denies risk factors for TB such has incarceration. He has a chronic cough but denies hemoptysis. Due to the worsening of symptoms, he would like to pursue further workup  Plan: - HIV, TB, blood cultures, CRP,  - CXR

## 2018-08-14 ENCOUNTER — Encounter: Payer: Medicare PPO | Admitting: Internal Medicine

## 2018-08-14 DIAGNOSIS — H35372 Puckering of macula, left eye: Secondary | ICD-10-CM | POA: Diagnosis not present

## 2018-08-14 DIAGNOSIS — H4312 Vitreous hemorrhage, left eye: Secondary | ICD-10-CM | POA: Diagnosis not present

## 2018-08-14 DIAGNOSIS — E113512 Type 2 diabetes mellitus with proliferative diabetic retinopathy with macular edema, left eye: Secondary | ICD-10-CM | POA: Diagnosis not present

## 2018-08-14 LAB — HIV ANTIBODY (ROUTINE TESTING W REFLEX): HIV Screen 4th Generation wRfx: NONREACTIVE

## 2018-08-14 LAB — C-REACTIVE PROTEIN: CRP: <1 mg/L (ref 0–10)

## 2018-08-19 LAB — QUANTIFERON-TB GOLD PLUS
QuantiFERON Mitogen Value: 4.48 IU/mL
QuantiFERON Nil Value: 0.18 IU/mL
QuantiFERON TB1 Ag Value: 1.63 IU/mL
QuantiFERON TB2 Ag Value: 1.11 IU/mL
QuantiFERON-TB Gold Plus: POSITIVE — AB

## 2018-08-19 LAB — CULTURE, BLOOD (SINGLE)

## 2018-08-27 ENCOUNTER — Telehealth: Payer: Self-pay | Admitting: *Deleted

## 2018-08-27 NOTE — Telephone Encounter (Signed)
Clarify previous note, BP is elevated

## 2018-08-27 NOTE — Telephone Encounter (Signed)
Sig other calls and states BP Took meds this am and then took bp 134/85, there was no wait in between, when would you like for him to come back for f/u?

## 2018-08-29 ENCOUNTER — Other Ambulatory Visit: Payer: Self-pay | Admitting: Internal Medicine

## 2018-08-29 NOTE — Telephone Encounter (Signed)
His goal BP is <140/90 so I'm happy with that result. He does not need to come back in.

## 2018-09-02 ENCOUNTER — Other Ambulatory Visit: Payer: Self-pay | Admitting: Internal Medicine

## 2018-09-02 ENCOUNTER — Other Ambulatory Visit: Payer: Self-pay

## 2018-09-02 DIAGNOSIS — I10 Essential (primary) hypertension: Secondary | ICD-10-CM

## 2018-09-02 DIAGNOSIS — E785 Hyperlipidemia, unspecified: Secondary | ICD-10-CM

## 2018-09-02 DIAGNOSIS — E11319 Type 2 diabetes mellitus with unspecified diabetic retinopathy without macular edema: Secondary | ICD-10-CM

## 2018-09-02 MED ORDER — ATORVASTATIN CALCIUM 80 MG PO TABS: 80.0000 mg | ORAL_TABLET | Freq: Every day | ORAL | 3 refills | Status: DC

## 2018-09-02 MED ORDER — AMLODIPINE BESYLATE 10 MG PO TABS: 10.0000 mg | ORAL_TABLET | Freq: Every day | ORAL | 3 refills | Status: DC

## 2018-09-02 MED ORDER — METFORMIN HCL 1000 MG PO TABS: 1000.0000 mg | ORAL_TABLET | Freq: Two times a day (BID) | ORAL | 5 refills | Status: DC

## 2018-09-02 MED ORDER — LISINOPRIL 40 MG PO TABS: 40.0000 mg | ORAL_TABLET | Freq: Every day | ORAL | 3 refills | Status: DC

## 2018-09-02 NOTE — Telephone Encounter (Signed)
Requesting all meds to filled @   [place], [place] [phone] (Phone) [phone] (Fax)   Pt is also requesting meter, test strips and Lancets.

## 2018-09-02 NOTE — Telephone Encounter (Signed)
Pt called requesting refills to [facility]. Meter/test strips already sent.  RN attempted to call pt back, unable to reach pt.  Dr. Donne Hazel, RX for amlodipine was sent 08/13/18, however the transmission failed. Please resend. RX's for metformin and lisinopril also expiring. Thank you! SChaplin, RN,BSN

## 2018-09-03 ENCOUNTER — Telehealth: Payer: Self-pay

## 2018-09-03 NOTE — Telephone Encounter (Signed)
I called the patient back and answered his questions.

## 2018-09-03 NOTE — Telephone Encounter (Signed)
Requesting test results. Please cal back.

## 2018-09-22 ENCOUNTER — Encounter: Payer: Self-pay | Admitting: *Deleted

## 2018-10-13 DIAGNOSIS — E113512 Type 2 diabetes mellitus with proliferative diabetic retinopathy with macular edema, left eye: Secondary | ICD-10-CM | POA: Diagnosis not present

## 2018-10-13 DIAGNOSIS — H4312 Vitreous hemorrhage, left eye: Secondary | ICD-10-CM | POA: Diagnosis not present

## 2018-10-13 DIAGNOSIS — H35372 Puckering of macula, left eye: Secondary | ICD-10-CM | POA: Diagnosis not present

## 2018-11-12 ENCOUNTER — Encounter: Payer: Self-pay | Admitting: Podiatry

## 2018-11-12 ENCOUNTER — Ambulatory Visit: Payer: Medicare PPO | Admitting: Podiatry

## 2018-11-12 ENCOUNTER — Other Ambulatory Visit: Payer: Self-pay

## 2018-11-12 VITALS — Temp 97.5°F

## 2018-11-12 DIAGNOSIS — B351 Tinea unguium: Secondary | ICD-10-CM

## 2018-11-12 DIAGNOSIS — E1142 Type 2 diabetes mellitus with diabetic polyneuropathy: Secondary | ICD-10-CM | POA: Diagnosis not present

## 2018-11-12 NOTE — Patient Instructions (Signed)
Diabetes Mellitus and Foot Care Foot care is an important part of your health, especially when you have diabetes. Diabetes may cause you to have problems because of poor blood flow (circulation) to your feet and legs, which can cause your skin to:  Become thinner and drier.  Break more easily.  Heal more slowly.  Peel and crack. You may also have nerve damage (neuropathy) in your legs and feet, causing decreased feeling in them. This means that you may not notice minor injuries to your feet that could lead to more serious problems. Noticing and addressing any potential problems early is the best way to prevent future foot problems. How to care for your feet Foot hygiene  Wash your feet daily with warm water and mild soap. Do not use hot water. Then, pat your feet and the areas between your toes until they are completely dry. Do not soak your feet as this can dry your skin.  Trim your toenails straight across. Do not dig under them or around the cuticle. File the edges of your nails with an emery board or nail file.  Apply a moisturizing lotion or petroleum jelly to the skin on your feet and to dry, brittle toenails. Use lotion that does not contain alcohol and is unscented. Do not apply lotion between your toes. Shoes and socks  Wear clean socks or stockings every day. Make sure they are not too tight. Do not wear knee-high stockings since they may decrease blood flow to your legs.  Wear shoes that fit properly and have enough cushioning. Always look in your shoes before you put them on to be sure there are no objects inside.  To break in new shoes, wear them for just a few hours a day. This prevents injuries on your feet. Wounds, scrapes, corns, and calluses  Check your feet daily for blisters, cuts, bruises, sores, and redness. If you cannot see the bottom of your feet, use a mirror or ask someone for help.  Do not cut corns or calluses or try to remove them with medicine.  If you  find a minor scrape, cut, or break in the skin on your feet, keep it and the skin around it clean and dry. You may clean these areas with mild soap and water. Do not clean the area with peroxide, alcohol, or iodine.  If you have a wound, scrape, corn, or callus on your foot, look at it several times a day to make sure it is healing and not infected. Check for: ? Redness, swelling, or pain. ? Fluid or blood. ? Warmth. ? Pus or a bad smell. General instructions  Do not cross your legs. This may decrease blood flow to your feet.  Do not use heating pads or hot water bottles on your feet. They may burn your skin. If you have lost feeling in your feet or legs, you may not know this is happening until it is too late.  Protect your feet from hot and cold by wearing shoes, such as at the beach or on hot pavement.  Schedule a complete foot exam at least once a year (annually) or more often if you have foot problems. If you have foot problems, report any cuts, sores, or bruises to your health care provider immediately. Contact a health care provider if:  You have a medical condition that increases your risk of infection and you have any cuts, sores, or bruises on your feet.  You have an injury that is not   healing.  You have redness on your legs or feet.  You feel burning or tingling in your legs or feet.  You have pain or cramps in your legs and feet.  Your legs or feet are numb.  Your feet always feel cold.  You have pain around a toenail. Get help right away if:  You have a wound, scrape, corn, or callus on your foot and: ? You have pain, swelling, or redness that gets worse. ? You have fluid or blood coming from the wound, scrape, corn, or callus. ? Your wound, scrape, corn, or callus feels warm to the touch. ? You have pus or a bad smell coming from the wound, scrape, corn, or callus. ? You have a fever. ? You have a red line going up your leg. Summary  Check your feet every day  for cuts, sores, red spots, swelling, and blisters.  Moisturize feet and legs daily.  Wear shoes that fit properly and have enough cushioning.  If you have foot problems, report any cuts, sores, or bruises to your health care provider immediately.  Schedule a complete foot exam at least once a year (annually) or more often if you have foot problems. This information is not intended to replace advice given to you by your health care provider. Make sure you discuss any questions you have with your health care provider. Document Released: 06/22/2000 Document Revised: 08/07/2017 Document Reviewed: 07/27/2016 Elsevier Interactive Patient Education  2019 [employer]  Onychomycosis/Fungal Toenails  WHAT IS IT? An infection that lies within the keratin of your nail plate that is caused by a fungus.  WHY ME? Fungal infections affect all ages, sexes, races, and creeds.  There may be many factors that predispose you to a fungal infection such as age, coexisting medical conditions such as diabetes, or an autoimmune disease; stress, medications, fatigue, genetics, etc.  Bottom line: fungus thrives in a warm, moist environment and your shoes offer such a location.  IS IT CONTAGIOUS? Theoretically, yes.  You do not want to share shoes, nail clippers or files with someone who has fungal toenails.  Walking around barefoot in the same room or sleeping in the same bed is unlikely to transfer the organism.  It is important to realize, however, that fungus can spread easily from one nail to the next on the same foot.  HOW DO WE TREAT THIS?  There are several ways to treat this condition.  Treatment may depend on many factors such as age, medications, pregnancy, liver and kidney conditions, etc.  It is best to ask your doctor which options are available to you.  1. No treatment.   Unlike many other medical concerns, you can live with this condition.  However for many people this can be a painful condition and  may lead to ingrown toenails or a bacterial infection.  It is recommended that you keep the nails cut short to help reduce the amount of fungal nail. 2. Topical treatment.  These range from herbal remedies to prescription strength nail lacquers.  About 40-50% effective, topicals require twice daily application for approximately 9 to 12 months or until an entirely new nail has grown out.  The most effective topicals are medical grade medications available through physicians offices. 3. Oral antifungal medications.  With an 80-90% cure rate, the most common oral medication requires 3 to 4 months of therapy and stays in your system for a year as the new nail grows out.  Oral antifungal medications do require   blood work to make sure it is a safe drug for you.  A liver function panel will be performed prior to starting the medication and after the first month of treatment.  It is important to have the blood work performed to avoid any harmful side effects.  In general, this medication safe but blood work is required. 4. Laser Therapy.  This treatment is performed by applying a specialized laser to the affected nail plate.  This therapy is noninvasive, fast, and non-painful.  It is not covered by insurance and is therefore, out of pocket.  The results have been very good with a 80-95% cure rate.  The [facility] is the only practice in the area to offer this therapy. 5. Permanent Nail Avulsion.  Removing the entire nail so that a new nail will not grow back. 

## 2018-11-20 ENCOUNTER — Encounter: Payer: Self-pay | Admitting: Podiatry

## 2018-11-20 NOTE — Progress Notes (Signed)
Subjective: Dennis Macias presents today with history of diabetic neuropathy with cc of painful, mycotic toenails.  Pain is aggravated when wearing enclosed shoe gear and relieved with periodic professional debridement.  Isabelle Course, MD is his PCP. Last visit was 08/13/2018.   Current Outpatient Medications:  .  ACCU-CHEK FASTCLIX LANCETS MISC, Check blood sugar up to 7 times a week as instructed, Disp: 102 each, Rfl: 3 .  acetaminophen (TYLENOL) 500 MG tablet, Take 500 mg by mouth every 6 (six) hours as needed., Disp: , Rfl:  .  amLODipine (NORVASC) 10 MG tablet, Take 1 tablet (10 mg total) by mouth daily., Disp: 90 tablet, Rfl: 3 .  aspirin 81 MG tablet, Take 1 tablet (81 mg total) by mouth daily. Internal Medicine Program place on hold until patient requests to fill, Disp: 90 tablet, Rfl: 3 .  atorvastatin (LIPITOR) 80 MG tablet, Take 1 tablet (80 mg total) by mouth at bedtime. IM program, Disp: 90 tablet, Rfl: 3 .  Blood Glucose Monitoring Suppl (ACCU-CHEK GUIDE) w/Device KIT, 1 each by Does not apply route daily. Check blood sugar as instructed up to 7 times a week, Disp: 1 kit, Rfl: 0 .  dorzolamide-timolol (COSOPT) 22.3-6.8 MG/ML ophthalmic solution, INSTILL 1 DROP INTO RIGHT EYE TWICE A DAY, Disp: , Rfl: 10 .  glucose blood (ACCU-CHEK GUIDE) test strip, Check blood sugar up to 7 times a week as instructed, Disp: 100 each, Rfl: 3 .  lisinopril (PRINIVIL,ZESTRIL) 40 MG tablet, Take 1 tablet (40 mg total) by mouth daily., Disp: 90 tablet, Rfl: 3 .  metFORMIN (GLUCOPHAGE) 1000 MG tablet, Take 1 tablet (1,000 mg total) by mouth 2 (two) times daily with a meal., Disp: 120 tablet, Rfl: 5 .  vitamin B-12 (CYANOCOBALAMIN) 100 MCG tablet, Take 1 tablet (100 mcg total) by mouth daily., Disp: 100 tablet, Rfl: 3  No Known Allergies  Objective:  Vascular Examination: Capillary refill time <3 seconds x 10 digits.  Dorsalis pedis 1/4 b/l.  Posterior tibial pulses 1/4 b/l.  Digital hair x 10  digits was absent.  Skin temperature gradient WNL b/l.  Dermatological Examination: Skin with normal turgor, texture and tone b/l.  Toenails 1-5 b/l discolored, thick, dystrophic with subungual debris and pain with palpation to nailbeds due to thickness of nails.  Musculoskeletal: Muscle strength 5/5 to all muscle groups b/l.  Neurological: Sensation with 10 gram monofilament is absent b/l.  Vibratory sensation is diminished b/l.  Assessment: 1. Painful onychomycosis toenails 1-5 b/l 2. NIDDM with neuropathy  Plan: 1. Toenails 1-5 b/l were debrided in length and girth without iatrogenic bleeding. 2. Patient to continue soft, supportive shoe gear 3. Patient to report any pedal injuries to medical professional  4. Follow up 3 months.  5. Patient/POA to call should there be a concern in the interim.

## 2018-12-08 DIAGNOSIS — H4312 Vitreous hemorrhage, left eye: Secondary | ICD-10-CM | POA: Diagnosis not present

## 2018-12-08 DIAGNOSIS — H35372 Puckering of macula, left eye: Secondary | ICD-10-CM | POA: Diagnosis not present

## 2018-12-08 DIAGNOSIS — E113512 Type 2 diabetes mellitus with proliferative diabetic retinopathy with macular edema, left eye: Secondary | ICD-10-CM | POA: Diagnosis not present

## 2018-12-08 DIAGNOSIS — H4089 Other specified glaucoma: Secondary | ICD-10-CM | POA: Diagnosis not present

## 2018-12-24 DIAGNOSIS — I10 Essential (primary) hypertension: Secondary | ICD-10-CM | POA: Diagnosis not present

## 2018-12-24 DIAGNOSIS — E119 Type 2 diabetes mellitus without complications: Secondary | ICD-10-CM | POA: Diagnosis not present

## 2018-12-24 DIAGNOSIS — H3530 Unspecified macular degeneration: Secondary | ICD-10-CM | POA: Diagnosis not present

## 2018-12-24 DIAGNOSIS — E785 Hyperlipidemia, unspecified: Secondary | ICD-10-CM | POA: Diagnosis not present

## 2018-12-24 DIAGNOSIS — Z7984 Long term (current) use of oral hypoglycemic drugs: Secondary | ICD-10-CM | POA: Diagnosis not present

## 2018-12-24 DIAGNOSIS — H5461 Unqualified visual loss, right eye, normal vision left eye: Secondary | ICD-10-CM | POA: Diagnosis not present

## 2018-12-24 DIAGNOSIS — H409 Unspecified glaucoma: Secondary | ICD-10-CM | POA: Diagnosis not present

## 2018-12-24 DIAGNOSIS — Z8673 Personal history of transient ischemic attack (TIA), and cerebral infarction without residual deficits: Secondary | ICD-10-CM | POA: Diagnosis not present

## 2018-12-27 ENCOUNTER — Encounter: Payer: Self-pay | Admitting: *Deleted

## 2019-01-08 ENCOUNTER — Other Ambulatory Visit: Payer: Self-pay

## 2019-01-08 ENCOUNTER — Ambulatory Visit (INDEPENDENT_AMBULATORY_CARE_PROVIDER_SITE_OTHER): Payer: Medicare PPO | Admitting: Internal Medicine

## 2019-01-08 ENCOUNTER — Encounter: Payer: Self-pay | Admitting: Internal Medicine

## 2019-01-08 VITALS — BP 160/79 | HR 75 | Temp 98.2°F

## 2019-01-08 DIAGNOSIS — I10 Essential (primary) hypertension: Secondary | ICD-10-CM | POA: Diagnosis not present

## 2019-01-08 DIAGNOSIS — R61 Generalized hyperhidrosis: Secondary | ICD-10-CM | POA: Diagnosis not present

## 2019-01-08 DIAGNOSIS — L74519 Primary focal hyperhidrosis, unspecified: Secondary | ICD-10-CM

## 2019-01-08 DIAGNOSIS — Z79899 Other long term (current) drug therapy: Secondary | ICD-10-CM | POA: Diagnosis not present

## 2019-01-08 DIAGNOSIS — R634 Abnormal weight loss: Secondary | ICD-10-CM | POA: Diagnosis not present

## 2019-01-08 DIAGNOSIS — E11319 Type 2 diabetes mellitus with unspecified diabetic retinopathy without macular edema: Secondary | ICD-10-CM | POA: Diagnosis not present

## 2019-01-08 LAB — GLUCOSE, CAPILLARY: Glucose-Capillary: 96 mg/dL (ref 70–99)

## 2019-01-08 LAB — POCT GLYCOSYLATED HEMOGLOBIN (HGB A1C): Hemoglobin A1C: 5.3 % (ref 4.0–5.6)

## 2019-01-08 MED ORDER — HYDROCHLOROTHIAZIDE 12.5 MG PO TABS: 12.5000 mg | ORAL_TABLET | Freq: Every day | ORAL | 2 refills | Status: DC

## 2019-01-08 MED FILL — HYDROCHLOROTHIAZIDE 12.5 MG: 12.5 | 30 days supply | Qty: 30 | Fill #0

## 2019-01-08 NOTE — Assessment & Plan Note (Addendum)
Blood pressure appear to be consistently elevated over past couple of visits. Amlodipine was increased at last visit in February. Blood pressures had previously been better when on htz however this was discontinued in 2018 due to some syncopal events. Will restart him on htz today which may help with the LE edema. Follow up in 4-6w for re evaluation of hypertension. Will obtain bmp at that time.

## 2019-01-08 NOTE — Assessment & Plan Note (Signed)
A1C today 5.3. Patient congratulated. No medication changes necessary at this time. Repeat A1C in 6 mo.

## 2019-01-08 NOTE — Assessment & Plan Note (Signed)
This continues to be an issue for him. 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 Encouraged him to try to increase in PO intake. He is already taking Ensure daily. The hyperhydrosis appears to have been worked up in the past.

## 2019-01-08 NOTE — Progress Notes (Signed)
Internal Medicine Clinic Attending  I saw and evaluated the patient.  I personally confirmed the key portions of the history and exam documented by Dr. [name]   and I reviewed pertinent patient test results.  The assessment, diagnosis, and plan were formulated together and I agree with the documentation in the resident's note.  

## 2019-01-08 NOTE — Progress Notes (Signed)
   CC: re evaluation of uncontrolled hypertension  HPI:  [name] is a 68 y.o. male who presents today for follow up of several chronic issues. Uncontrolled hypertension. Denies any headache, vision changes or heart palpitations. Endorses medication compliance. DM recheck. Denies any issues with hypoglycemic episodes at home. Reviewed A1C today--5.3. 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 He is currently taking Ensure daily to help with his caloric intake. The hyperhidrosis has been investigated without a clear conclusion. Pt expresses frustration from this but denies any recent changes.   Past Medical History:  Diagnosis Date  . Anemia   . Arthritis    past hx   . Blindness    right eye  . Cataract    removed both eyes  . Dehydration   . Diabetes ([place])   . Glaucoma   . History of urinary retention   . Hyperlipidemia   . Hypertension   . Pernicious anemia 02/24/2018  . Stroke [facility])    2017- March  . Tachycardia 08/26/2017  . Tubular adenoma of colon 02/2017  . Weight loss, non-intentional 08/26/2017   10 lbs between 6/18 & 2/19    Review of Systems:  negative other than those stated in HPI  Physical Exam:  Vitals:   01/08/19 1330  BP: (!) 160/79  Pulse: 75  Temp: 98.2 F (36.8 C)  TempSrc: Oral    GENERAL: well appearing. In no acute distress CARDIAC: heart RRR. Peripheral pulses intact. +2 pitting edema of bilateral lower extremities extending to mid shin PULMONARY: lung sounds clear.  NEURO: CNII-XII grossly intact. ABDOMEN: abd non distended. Bowel sounds present SKIN: no rash or lesions on limited exam   Assessment & Plan:   See Encounters Tab for problem based charting. Pertinent labs & imaging results that were available during my care of the patient were reviewed by me and considered in my medical decision making   Diet and weight goals discussed. Dennis Macias  is in agreement with the plan and endorses no further questions at this time      Patient seen with Dr. Elwanda Brooklyn, MD Internal Medicine Resident-PGY1 01/08/19

## 2019-01-19 DIAGNOSIS — H35372 Puckering of macula, left eye: Secondary | ICD-10-CM | POA: Diagnosis not present

## 2019-01-19 DIAGNOSIS — E113512 Type 2 diabetes mellitus with proliferative diabetic retinopathy with macular edema, left eye: Secondary | ICD-10-CM | POA: Diagnosis not present

## 2019-01-19 DIAGNOSIS — H4051X3 Glaucoma secondary to other eye disorders, right eye, severe stage: Secondary | ICD-10-CM | POA: Diagnosis not present

## 2019-01-19 DIAGNOSIS — H4312 Vitreous hemorrhage, left eye: Secondary | ICD-10-CM | POA: Diagnosis not present

## 2019-02-11 ENCOUNTER — Ambulatory Visit: Payer: Medicare PPO | Admitting: Podiatry

## 2019-02-11 ENCOUNTER — Ambulatory Visit (INDEPENDENT_AMBULATORY_CARE_PROVIDER_SITE_OTHER): Payer: Medicare PPO | Admitting: Internal Medicine

## 2019-02-11 ENCOUNTER — Encounter: Payer: Self-pay | Admitting: Podiatry

## 2019-02-11 ENCOUNTER — Other Ambulatory Visit: Payer: Self-pay

## 2019-02-11 VITALS — Temp 98.5°F

## 2019-02-11 DIAGNOSIS — I10 Essential (primary) hypertension: Secondary | ICD-10-CM | POA: Diagnosis not present

## 2019-02-11 DIAGNOSIS — L74519 Primary focal hyperhidrosis, unspecified: Secondary | ICD-10-CM

## 2019-02-11 DIAGNOSIS — Z79899 Other long term (current) drug therapy: Secondary | ICD-10-CM | POA: Diagnosis not present

## 2019-02-11 DIAGNOSIS — B351 Tinea unguium: Secondary | ICD-10-CM | POA: Diagnosis not present

## 2019-02-11 DIAGNOSIS — L74511 Primary focal hyperhidrosis, face: Secondary | ICD-10-CM | POA: Diagnosis not present

## 2019-02-11 DIAGNOSIS — M79674 Pain in right toe(s): Secondary | ICD-10-CM

## 2019-02-11 DIAGNOSIS — E1142 Type 2 diabetes mellitus with diabetic polyneuropathy: Secondary | ICD-10-CM

## 2019-02-11 DIAGNOSIS — M79675 Pain in left toe(s): Secondary | ICD-10-CM | POA: Diagnosis not present

## 2019-02-11 MED ORDER — HYDROCHLOROTHIAZIDE 25 MG PO TABS: 25.0000 mg | ORAL_TABLET | Freq: Every day | ORAL | 2 refills | Status: DC

## 2019-02-11 MED FILL — HYDROCHLOROTHIAZIDE 25 MG T: 25 | 30 days supply | Qty: 30 | Fill #0

## 2019-02-11 NOTE — Assessment & Plan Note (Signed)
Patient presents for follow-up after addition of HCTZ 12.5 mg to blood pressure regimen. Reviewing his home blood pressure log, he has readings that range from [id number] systolic and 75Z-02H diastolic. He remains asymptomatic. Tolerating new medication well.  - checking BMP today - increase HCTZ to 25 mg daily; continue Amlodipine 10 mg and Lisinopril 40 mg  - follow-up in 4-6 weeks with PCP - advised his partner that she could decrease frequency of blood pressure checks to a few times a week and to bring log in at next visit

## 2019-02-11 NOTE — Assessment & Plan Note (Addendum)
Continues to be a concern. Work-up thus far without obvious etiology. Will have him continue following with PCP. If there is no underlying etiology, first line treatment for craniofacial hyperhidrosis appears to be topical antiperspirants, followed by botox injection or systemic treatment as second line.

## 2019-02-11 NOTE — Patient Instructions (Signed)
Diabetes Mellitus and Foot Care Foot care is an important part of your health, especially when you have diabetes. Diabetes may cause you to have problems because of poor blood flow (circulation) to your feet and legs, which can cause your skin to:  Become thinner and drier.  Break more easily.  Heal more slowly.  Peel and crack. You may also have nerve damage (neuropathy) in your legs and feet, causing decreased feeling in them. This means that you may not notice minor injuries to your feet that could lead to more serious problems. Noticing and addressing any potential problems early is the best way to prevent future foot problems. How to care for your feet Foot hygiene  Wash your feet daily with warm water and mild soap. Do not use hot water. Then, pat your feet and the areas between your toes until they are completely dry. Do not soak your feet as this can dry your skin.  Trim your toenails straight across. Do not dig under them or around the cuticle. File the edges of your nails with an emery board or nail file.  Apply a moisturizing lotion or petroleum jelly to the skin on your feet and to dry, brittle toenails. Use lotion that does not contain alcohol and is unscented. Do not apply lotion between your toes. Shoes and socks  Wear clean socks or stockings every day. Make sure they are not too tight. Do not wear knee-high stockings since they may decrease blood flow to your legs.  Wear shoes that fit properly and have enough cushioning. Always look in your shoes before you put them on to be sure there are no objects inside.  To break in new shoes, wear them for just a few hours a day. This prevents injuries on your feet. Wounds, scrapes, corns, and calluses  Check your feet daily for blisters, cuts, bruises, sores, and redness. If you cannot see the bottom of your feet, use a mirror or ask someone for help.  Do not cut corns or calluses or try to remove them with medicine.  If you  find a minor scrape, cut, or break in the skin on your feet, keep it and the skin around it clean and dry. You may clean these areas with mild soap and water. Do not clean the area with peroxide, alcohol, or iodine.  If you have a wound, scrape, corn, or callus on your foot, look at it several times a day to make sure it is healing and not infected. Check for: ? Redness, swelling, or pain. ? Fluid or blood. ? Warmth. ? Pus or a bad smell. General instructions  Do not cross your legs. This may decrease blood flow to your feet.  Do not use heating pads or hot water bottles on your feet. They may burn your skin. If you have lost feeling in your feet or legs, you may not know this is happening until it is too late.  Protect your feet from hot and cold by wearing shoes, such as at the beach or on hot pavement.  Schedule a complete foot exam at least once a year (annually) or more often if you have foot problems. If you have foot problems, report any cuts, sores, or bruises to your health care provider immediately. Contact a health care provider if:  You have a medical condition that increases your risk of infection and you have any cuts, sores, or bruises on your feet.  You have an injury that is not   healing.  You have redness on your legs or feet.  You feel burning or tingling in your legs or feet.  You have pain or cramps in your legs and feet.  Your legs or feet are numb.  Your feet always feel cold.  You have pain around a toenail. Get help right away if:  You have a wound, scrape, corn, or callus on your foot and: ? You have pain, swelling, or redness that gets worse. ? You have fluid or blood coming from the wound, scrape, corn, or callus. ? Your wound, scrape, corn, or callus feels warm to the touch. ? You have pus or a bad smell coming from the wound, scrape, corn, or callus. ? You have a fever. ? You have a red line going up your leg. Summary  Check your feet every day  for cuts, sores, red spots, swelling, and blisters.  Moisturize feet and legs daily.  Wear shoes that fit properly and have enough cushioning.  If you have foot problems, report any cuts, sores, or bruises to your health care provider immediately.  Schedule a complete foot exam at least once a year (annually) or more often if you have foot problems. This information is not intended to replace advice given to you by your health care provider. Make sure you discuss any questions you have with your health care provider. Document Released: 06/22/2000 Document Revised: 08/07/2017 Document Reviewed: 07/27/2016 Elsevier Patient Education  2020 [employer]   Onychomycosis/Fungal Toenails  WHAT IS IT? An infection that lies within the keratin of your nail plate that is caused by a fungus.  WHY ME? Fungal infections affect all ages, sexes, races, and creeds.  There may be many factors that predispose you to a fungal infection such as age, coexisting medical conditions such as diabetes, or an autoimmune disease; stress, medications, fatigue, genetics, etc.  Bottom line: fungus thrives in a warm, moist environment and your shoes offer such a location.  IS IT CONTAGIOUS? Theoretically, yes.  You do not want to share shoes, nail clippers or files with someone who has fungal toenails.  Walking around barefoot in the same room or sleeping in the same bed is unlikely to transfer the organism.  It is important to realize, however, that fungus can spread easily from one nail to the next on the same foot.  HOW DO WE TREAT THIS?  There are several ways to treat this condition.  Treatment may depend on many factors such as age, medications, pregnancy, liver and kidney conditions, etc.  It is best to ask your doctor which options are available to you.  1. No treatment.   Unlike many other medical concerns, you can live with this condition.  However for many people this can be a painful condition and may lead to  ingrown toenails or a bacterial infection.  It is recommended that you keep the nails cut short to help reduce the amount of fungal nail. 2. Topical treatment.  These range from herbal remedies to prescription strength nail lacquers.  About 40-50% effective, topicals require twice daily application for approximately 9 to 12 months or until an entirely new nail has grown out.  The most effective topicals are medical grade medications available through physicians offices. 3. Oral antifungal medications.  With an 80-90% cure rate, the most common oral medication requires 3 to 4 months of therapy and stays in your system for a year as the new nail grows out.  Oral antifungal medications do require   blood work to make sure it is a safe drug for you.  A liver function panel will be performed prior to starting the medication and after the first month of treatment.  It is important to have the blood work performed to avoid any harmful side effects.  In general, this medication safe but blood work is required. 4. Laser Therapy.  This treatment is performed by applying a specialized laser to the affected nail plate.  This therapy is noninvasive, fast, and non-painful.  It is not covered by insurance and is therefore, out of pocket.  The results have been very good with a 80-95% cure rate.  The [facility] is the only practice in the area to offer this therapy. 5. Permanent Nail Avulsion.  Removing the entire nail so that a new nail will not grow back. 

## 2019-02-11 NOTE — Patient Instructions (Signed)
[name], It was nice meeting you. I appreciate you keeping such a thorough blood pressure log. Since they are still elevated, we will increase your HCTZ to 25 mg daily. I have sent this new prescription into your pharmacy.  It would be helpful if you continue to take your blood pressure a few times a week and write them down for your next visit in about 1 month with your primary care doctor.   We are checking some lab work today, and I will let you know if any changes need to be made.   Please call if you have any questions or concerns. Otherwise, you'll see Dr. Darrick Meigs next month for follow-up.   Take care, Dr. Koleen Distance

## 2019-02-11 NOTE — Progress Notes (Signed)
Internal Medicine Clinic Attending  Case discussed with Dr. Bloomfield at the time of the visit.  We reviewed the resident's history and exam and pertinent patient test results.  I agree with the assessment, diagnosis, and plan of care documented in the resident's note.  

## 2019-02-11 NOTE — Progress Notes (Signed)
   CC: HTN  HPI:  Mr.Teigen Skalla is a 68 y.o. gentleman with PMHx listed below who presents for follow-up on uncontrolled HTN. Please see problem based charting for further details.   Past Medical History:  Diagnosis Date  . Anemia   . Arthritis    past hx   . Blindness    right eye  . Cataract    removed both eyes  . Dehydration   . Diabetes ([place])   . Glaucoma   . History of urinary retention   . Hyperlipidemia   . Hypertension   . Pernicious anemia 02/24/2018  . Stroke [facility])    2017- March  . Tachycardia 08/26/2017  . Tubular adenoma of colon 02/2017  . Weight loss, non-intentional 08/26/2017   10 lbs between 6/18 & 2/19   Review of Systems:  Review of Systems  Constitutional: Negative for chills and fever.  Eyes: Negative for blurred vision and double vision.  Respiratory: Negative for shortness of breath.   Cardiovascular: Negative for chest pain and palpitations.  Gastrointestinal: Negative for abdominal pain, constipation and diarrhea.  Genitourinary: Negative for hematuria.  Neurological: Negative for dizziness, sensory change, focal weakness and headaches.     Physical Exam:  Vitals:   02/11/19 1054 02/11/19 1100  BP: (!) 155/84 (!) 154/87  Pulse: 82 80  Temp: 99.5 F (37.5 C)   TempSrc: Oral   SpO2: 100%   Weight: 165 lb (74.8 kg)   Height: 6' (1.829 m)    General: alert, well-appearing gentleman in NAD CV: RRR; no m/r/g Pulm: normal work of breathing; lungs CTAB Neuro: A&Ox3; no focal deficits Ext: 2+ pitting edema BLE extending to mid shin  Assessment & Plan:   See Encounters Tab for problem based charting.  Patient discussed with Dr. Lynnae January

## 2019-02-12 LAB — BMP8+ANION GAP
Anion Gap: 14 mmol/L (ref 10.0–18.0)
BUN/Creatinine Ratio: 9 — ABNORMAL LOW (ref 10–24)
BUN: 22 mg/dL (ref 8–27)
CO2: 17 mmol/L — ABNORMAL LOW (ref 20–29)
Calcium: 8.7 mg/dL (ref 8.6–10.2)
Chloride: 109 mmol/L — ABNORMAL HIGH (ref 96–106)
Creatinine, Ser: 2.34 mg/dL — ABNORMAL HIGH (ref 0.76–1.27)
GFR calc Af Amer: 32 mL/min/{1.73_m2} — ABNORMAL LOW (ref >59)
GFR calc non Af Amer: 28 mL/min/{1.73_m2} — ABNORMAL LOW (ref >59)
Glucose: 95 mg/dL (ref 65–99)
Potassium: 4.7 mmol/L (ref 3.5–5.2)
Sodium: 140 mmol/L (ref 134–144)

## 2019-02-15 NOTE — Progress Notes (Signed)
Subjective:  Dennis Macias presents to clinic today with cc of  painful, thick, discolored, elongated toenails 1-5 b/l that become tender and cannot cut because of thickness.  He voices no new pedal concerns on today's visit.    Current Outpatient Medications:  .  ACCU-CHEK FASTCLIX LANCETS MISC, Check blood sugar up to 7 times a week as instructed, Disp: 102 each, Rfl: 3 .  acetaminophen (TYLENOL) 500 MG tablet, Take 500 mg by mouth every 6 (six) hours as needed., Disp: , Rfl:  .  amLODipine (NORVASC) 10 MG tablet, Take 1 tablet (10 mg total) by mouth daily., Disp: 90 tablet, Rfl: 3 .  aspirin 81 MG tablet, Take 1 tablet (81 mg total) by mouth daily. Internal Medicine Program place on hold until patient requests to fill, Disp: 90 tablet, Rfl: 3 .  atorvastatin (LIPITOR) 80 MG tablet, Take 1 tablet (80 mg total) by mouth at bedtime. IM program, Disp: 90 tablet, Rfl: 3 .  Blood Glucose Monitoring Suppl (ACCU-CHEK GUIDE) w/Device KIT, 1 each by Does not apply route daily. Check blood sugar as instructed up to 7 times a week, Disp: 1 kit, Rfl: 0 .  dorzolamide-timolol (COSOPT) 22.3-6.8 MG/ML ophthalmic solution, INSTILL 1 DROP INTO RIGHT EYE TWICE A DAY, Disp: , Rfl: 10 .  glucose blood (ACCU-CHEK GUIDE) test strip, Check blood sugar up to 7 times a week as instructed, Disp: 100 each, Rfl: 3 .  hydrochlorothiazide (HYDRODIURIL) 25 MG tablet, Take 1 tablet (25 mg total) by mouth daily., Disp: 30 tablet, Rfl: 2 .  lisinopril (PRINIVIL,ZESTRIL) 40 MG tablet, Take 1 tablet (40 mg total) by mouth daily., Disp: 90 tablet, Rfl: 3 .  metFORMIN (GLUCOPHAGE) 1000 MG tablet, Take 1 tablet (1,000 mg total) by mouth 2 (two) times daily with a meal., Disp: 120 tablet, Rfl: 5 .  vitamin B-12 (CYANOCOBALAMIN) 100 MCG tablet, Take 1 tablet (100 mcg total) by mouth daily., Disp: 100 tablet, Rfl: 3   No Known Allergies   Objective: Vitals:   02/11/19 1455  Temp: 98.5 F (36.9 C)    Physical  Examination:  Vascular Examination: Capillary refill time <3 seconds x 10 digits.  DP 1/4 b/l.  PT pulses 1/4 b/l.  Digital hair absent b/l.  No edema noted b/l.  Skin temperature gradient WNL b/l.  Dermatological Examination: Skin with normal turgor, texture and tone b/l.  No open wounds b/l.  No interdigital macerations noted b/l.  Elongated, thick, discolored brittle toenails with subungual debris and pain on dorsal palpation of nailbeds 1-5 b/l.  Musculoskeletal Examination: Muscle strength 5/5 to all muscle groups b/l.  No pain, crepitus or joint discomfort with active/passive ROM.  Neurological Examination: Sensation absent b/l with 10 gram monofilament.  Vibratory sensation absent b/l.  Assessment: Mycotic nail infection with pain 1-5 b/l NIDDM with neuropathy  Plan: 1. Toenails 1-5 b/l were debrided in length and girth without iatrogenic laceration. 2.  Continue soft, supportive shoe gear daily. 3.  Report any pedal injuries to medical professional. 4.  Follow up 3 months. 5.  Patient/POA to call should there be a question/concern in there interim.

## 2019-02-23 ENCOUNTER — Other Ambulatory Visit: Payer: Self-pay | Admitting: Internal Medicine

## 2019-02-23 DIAGNOSIS — H3562 Retinal hemorrhage, left eye: Secondary | ICD-10-CM | POA: Diagnosis not present

## 2019-02-23 DIAGNOSIS — H35372 Puckering of macula, left eye: Secondary | ICD-10-CM | POA: Diagnosis not present

## 2019-02-23 DIAGNOSIS — E113512 Type 2 diabetes mellitus with proliferative diabetic retinopathy with macular edema, left eye: Secondary | ICD-10-CM | POA: Diagnosis not present

## 2019-02-23 DIAGNOSIS — H4312 Vitreous hemorrhage, left eye: Secondary | ICD-10-CM | POA: Diagnosis not present

## 2019-02-23 NOTE — Telephone Encounter (Signed)
Needs refill on vitamin B-12 (CYANOCOBALAMIN) 100 MCG tablet  hydrochlorothiazide (HYDRODIURIL) 25 MG tablet ;pt contact [place], [place]

## 2019-02-25 MED ORDER — VITAMIN B-12 100 MCG PO TABS: 100.0000 ug | ORAL_TABLET | Freq: Every day | ORAL | 3 refills | Status: DC

## 2019-03-10 MED FILL — HYDROCHLOROTHIAZIDE 25 MG T: 25 | 30 days supply | Qty: 30 | Fill #1

## 2019-03-13 DIAGNOSIS — Z20828 Contact with and (suspected) exposure to other viral communicable diseases: Secondary | ICD-10-CM | POA: Diagnosis not present

## 2019-03-13 DIAGNOSIS — Z6821 Body mass index (BMI) 21.0-21.9, adult: Secondary | ICD-10-CM | POA: Diagnosis not present

## 2019-03-23 NOTE — Progress Notes (Signed)
   CC: hypertension  HPI:  Mr.Montey Patras is a 68 y.o. male who presents today for blood pressure recheck. Please see problem based charting for hpi, assessment and plan.     Past Medical History:  Diagnosis Date  . Anemia   . Arthritis    past hx   . Blindness    right eye  . Cataract    removed both eyes  . Dehydration   . Diabetes ([place])   . Glaucoma   . History of urinary retention   . Hyperlipidemia   . Hypertension   . Pernicious anemia 02/24/2018  . Stroke [facility])    2017- March  . Tachycardia 08/26/2017  . Tubular adenoma of colon 02/2017  . Weight loss, non-intentional 08/26/2017   10 lbs between 6/18 & 2/19    Review of Systems:  Review of Systems - General ROS: no change in - fatigue or fever Respiratory ROS: no change in cough, shortness of breath, or wheezing Cardiovascular ROS: no change in  chest pain or dyspnea on exertion Neurological ROS: negative for - dizziness or headaches   Physical Exam:  Vitals:   03/26/19 1321  BP: 140/81  Pulse: 85  Temp: 98.7 F (37.1 C)  TempSrc: Oral  SpO2: 100%  Weight: 162 lb (73.5 kg)    GENERAL: chronically ill appearing. HEENT: no conjunctival injection. Nares patent. No rhinorrhea.  CARDIAC: heart regular rate and rhythm, +1 pitting edema to bilateral lower extremities PULMONARY: lung sounds clear to auscultation Abd: soft, nontender Skin: no erythema or edema of the face. No rash. No pustular lesions.   Assessment & Plan:   See Encounters Tab for problem based charting.  Pertinent labs & imaging results that were available during my care of the patient were reviewed by me and considered in my medical decision making  Patient is in agreement with the plan and endorses no further questions at this time.  Patient seen with Dr. Venetia Maxon, MD Internal Medicine Resident-PGY1 03/26/19

## 2019-03-23 NOTE — Assessment & Plan Note (Addendum)
Medications:amlodipine 10mg  daily, hydrochlorothiazide  (HCTZ) 25mg  daily and lisinopril 40mg  daily. I restarted on hctz in beginning of July at 12.5mg . Was rechecked in the clinic the beginning of august and hctz was increased to 25 due to persistent htn.   Pt reports compliance with medication. Denies medication side effects BP in office today: 140/81  Plan: Repeat BMP today indicates a significant decline in renal function. This correlates with starting hctz. Will call patient and have him stop the hctz, metformin and lisinopril. Will likely replace with metoprolol and have him come back in 2 weeks for recheck.

## 2019-03-26 ENCOUNTER — Encounter: Payer: Self-pay | Admitting: Internal Medicine

## 2019-03-26 ENCOUNTER — Other Ambulatory Visit: Payer: Self-pay

## 2019-03-26 ENCOUNTER — Ambulatory Visit (INDEPENDENT_AMBULATORY_CARE_PROVIDER_SITE_OTHER): Payer: Medicare PPO | Admitting: Internal Medicine

## 2019-03-26 VITALS — BP 140/81 | HR 85 | Temp 98.7°F | Wt 162.0 lb

## 2019-03-26 DIAGNOSIS — I10 Essential (primary) hypertension: Secondary | ICD-10-CM | POA: Diagnosis not present

## 2019-03-26 DIAGNOSIS — Z23 Encounter for immunization: Secondary | ICD-10-CM

## 2019-03-26 DIAGNOSIS — E11319 Type 2 diabetes mellitus with unspecified diabetic retinopathy without macular edema: Secondary | ICD-10-CM | POA: Diagnosis not present

## 2019-03-26 DIAGNOSIS — L74519 Primary focal hyperhidrosis, unspecified: Secondary | ICD-10-CM

## 2019-03-26 DIAGNOSIS — E785 Hyperlipidemia, unspecified: Secondary | ICD-10-CM | POA: Diagnosis not present

## 2019-03-26 DIAGNOSIS — Z7984 Long term (current) use of oral hypoglycemic drugs: Secondary | ICD-10-CM

## 2019-03-26 DIAGNOSIS — Z79899 Other long term (current) drug therapy: Secondary | ICD-10-CM

## 2019-03-26 DIAGNOSIS — L74511 Primary focal hyperhidrosis, face: Secondary | ICD-10-CM | POA: Diagnosis not present

## 2019-03-26 DIAGNOSIS — N179 Acute kidney failure, unspecified: Secondary | ICD-10-CM | POA: Diagnosis not present

## 2019-03-26 LAB — BASIC METABOLIC PANEL
Anion gap: 9 (ref 5–15)
BUN: 24 mg/dL — ABNORMAL HIGH (ref 8–23)
CO2: 19 mmol/L — ABNORMAL LOW (ref 22–32)
Calcium: 8.6 mg/dL — ABNORMAL LOW (ref 8.9–10.3)
Chloride: 111 mmol/L (ref 98–111)
Creatinine, Ser: 3.02 mg/dL — ABNORMAL HIGH (ref 0.61–1.24)
GFR calc Af Amer: 24 mL/min — ABNORMAL LOW (ref >60)
GFR calc non Af Amer: 20 mL/min — ABNORMAL LOW (ref >60)
Glucose, Bld: 119 mg/dL — ABNORMAL HIGH (ref 70–99)
Potassium: 4.3 mmol/L (ref 3.5–5.1)
Sodium: 139 mmol/L (ref 135–145)

## 2019-03-26 MED ORDER — METFORMIN HCL 1000 MG PO TABS: 1000.0000 mg | ORAL_TABLET | Freq: Two times a day (BID) | ORAL | 5 refills | Status: DC

## 2019-03-26 MED ORDER — LISINOPRIL 40 MG PO TABS: 40.0000 mg | ORAL_TABLET | Freq: Every day | ORAL | 3 refills | Status: DC

## 2019-03-26 MED ORDER — ATORVASTATIN CALCIUM 80 MG PO TABS: 80.0000 mg | ORAL_TABLET | Freq: Every day | ORAL | 3 refills | Status: DC

## 2019-03-26 MED ORDER — AMLODIPINE BESYLATE 10 MG PO TABS: 10.0000 mg | ORAL_TABLET | Freq: Every day | ORAL | 3 refills | Status: DC

## 2019-03-26 NOTE — Assessment & Plan Note (Signed)
Long standing history of his head sweating when he eats. Denies diaphoresis on other body locations. Happens almost every time he eats. Has used antipersirant on his face and neck in the past which worked well however does not completely resolve the issue. Plan: will place referral to dermatology for evaluation and further management.

## 2019-03-26 NOTE — Assessment & Plan Note (Signed)
Medications:Lipitor (atorvastatin)80mg . Pt reports compliance with statin. Denies side effects. Encouraged low cholesterol diet and exercise.  Plan: continue current management.

## 2019-03-26 NOTE — Assessment & Plan Note (Signed)
Last A1C 5.3 in July. On metformin. Denies side effects. Plan: will obtain microalbum/cr urine today. Follow up in one month for A1C recheck.

## 2019-03-26 NOTE — Patient Instructions (Signed)
It was nice to see you again today. For your head sweating, I am going to send you to dermatology to see if they can help you out. In the mean time, you can continue to use antipersirant as well. Regarding your blood pressure, your kidney function declined a little bit since July, which may be due to the hydrochlorothiazide. We will recheck your kidney function today, and I will give you a call with any necessary changes.  Take care!

## 2019-03-26 NOTE — Assessment & Plan Note (Signed)
Pt has had steady decline in renal function since July.  BMP Latest Ref Rng & Units 03/26/2019 02/11/2019 02/24/2018  BUN 8 - 23 mg/dL 24(H) 22 13  Creatinine 0.61 - 1.24 mg/dL 3.02(H) 2.34(H) 1.27  GFR was 32 in August--> now 24  Hctz started in July due to consistently elevated blood pressures. This is likely a contributing factor given the time frame. Patient also has DM type II however it is unlikely that this would be the sole cause of the sudden decline.  Plan: will have him stop the hctz and come back in 4 weeks for recheck. If there continues to be a decline, may need to consider nephrology referral and/or further medication adjustments.

## 2019-03-27 LAB — MICROALBUMIN / CREATININE URINE RATIO
Creatinine, Urine: 80.2 mg/dL
Microalb/Creat Ratio: 4187 mg/g creat — ABNORMAL HIGH (ref 0–29)
Microalbumin, Urine: 3358 ug/mL

## 2019-03-27 MED ORDER — METOPROLOL TARTRATE 25 MG PO TABS: 12.5000 mg | ORAL_TABLET | Freq: Two times a day (BID) | ORAL | 0 refills | Status: DC

## 2019-03-27 MED FILL — METOPROLOL TARTRATE 25 MG T: 25 | 30 days supply | Qty: 30 | Fill #0

## 2019-03-30 ENCOUNTER — Telehealth: Payer: Self-pay

## 2019-03-30 ENCOUNTER — Telehealth: Payer: Self-pay | Admitting: Internal Medicine

## 2019-03-30 DIAGNOSIS — H35372 Puckering of macula, left eye: Secondary | ICD-10-CM | POA: Diagnosis not present

## 2019-03-30 DIAGNOSIS — E113512 Type 2 diabetes mellitus with proliferative diabetic retinopathy with macular edema, left eye: Secondary | ICD-10-CM | POA: Diagnosis not present

## 2019-03-30 NOTE — Telephone Encounter (Signed)
error 

## 2019-03-30 NOTE — Progress Notes (Signed)
BMP suggests significant decline in renal function. Timeline correlates with restarting hctz in July. Called Dennis Macias and discussed lab results. Instructed him to discontinue the metformin, lisinopril and hctz for the time being.  Discussed that I am sending metoprolol into his pharmacy and that he needs to follow up with myself or one of my colleagues in 2 weeks for re evaluation of renal function and htn. Implied understanding and agrees to the plan.

## 2019-03-30 NOTE — Progress Notes (Signed)
Internal Medicine Clinic Attending  I saw and evaluated the patient.  I personally confirmed the key portions of the history and exam documented by Dr. Darrick Meigs and I reviewed pertinent patient test results.  The assessment, diagnosis, and plan were formulated together and I agree with the documentation in the resident's note.   Patient noted to have significant decline in renal function over the past month after initiation of HCTZ. Intern has instructed patient to stop HCTZ and hold metformin and lisinopril as well. Recommend beta blocker for HTN. Follow up 2 weeks for repeat BMP check. If renal function not improving, will need referral to nephrology and further work up for rapidly declining renal function.   Patient also has a history of focal hyperhidrosis. He experiences excessive facial sweating, particularly while eating ,that is very distressing for him. He has tried topical antiperspirants and is asking about other therapy. Patient agreeable to dermatology referral for possible botox.

## 2019-03-30 NOTE — Telephone Encounter (Signed)
Attemped to contact Mr. Kerschner since 9/17 however was unable to reach him until today.  Spoke with Mr. Strohmeier regarding his lab work and medications. He did want me to also explain this with his domestic partner, [name], as well so I discussed the following with her as well:  At last visit on 03/26/19, BMP showed significant decline in renal function. May be due to having started hctz in July. Explained that I would like him to stop taking the hctz, lisinopril and metformin for the time being and that I am sending in metoprolol to the pharmacy. I strongly encouraged that he needs to be seen in 2 weeks to re-evaluate his blood pressure and renal function. They implied understanding and agreed to this plan. Denied any further questions.  [name], MD Internal Medicine Resident, PGY1 12:40 PM 03/30/19

## 2019-04-15 ENCOUNTER — Encounter: Payer: Self-pay | Admitting: Internal Medicine

## 2019-04-21 ENCOUNTER — Encounter: Payer: Self-pay | Admitting: Internal Medicine

## 2019-04-21 ENCOUNTER — Ambulatory Visit (INDEPENDENT_AMBULATORY_CARE_PROVIDER_SITE_OTHER): Payer: Medicare PPO | Admitting: Internal Medicine

## 2019-04-21 ENCOUNTER — Other Ambulatory Visit: Payer: Self-pay

## 2019-04-21 VITALS — BP 144/70 | HR 74 | Wt 167.9 lb

## 2019-04-21 DIAGNOSIS — N184 Chronic kidney disease, stage 4 (severe): Secondary | ICD-10-CM

## 2019-04-21 DIAGNOSIS — I129 Hypertensive chronic kidney disease with stage 1 through stage 4 chronic kidney disease, or unspecified chronic kidney disease: Secondary | ICD-10-CM

## 2019-04-21 DIAGNOSIS — Z79899 Other long term (current) drug therapy: Secondary | ICD-10-CM

## 2019-04-21 DIAGNOSIS — E11319 Type 2 diabetes mellitus with unspecified diabetic retinopathy without macular edema: Secondary | ICD-10-CM

## 2019-04-21 DIAGNOSIS — E1122 Type 2 diabetes mellitus with diabetic chronic kidney disease: Secondary | ICD-10-CM

## 2019-04-21 DIAGNOSIS — I10 Essential (primary) hypertension: Secondary | ICD-10-CM | POA: Diagnosis not present

## 2019-04-21 DIAGNOSIS — N179 Acute kidney failure, unspecified: Secondary | ICD-10-CM

## 2019-04-21 LAB — BASIC METABOLIC PANEL
Anion gap: 7 (ref 5–15)
BUN: 19 mg/dL (ref 8–23)
CO2: 21 mmol/L — ABNORMAL LOW (ref 22–32)
Calcium: 8.3 mg/dL — ABNORMAL LOW (ref 8.9–10.3)
Chloride: 113 mmol/L — ABNORMAL HIGH (ref 98–111)
Creatinine, Ser: 3.12 mg/dL — ABNORMAL HIGH (ref 0.61–1.24)
GFR calc Af Amer: 23 mL/min — ABNORMAL LOW (ref >60)
GFR calc non Af Amer: 20 mL/min — ABNORMAL LOW (ref >60)
Glucose, Bld: 188 mg/dL — ABNORMAL HIGH (ref 70–99)
Potassium: 4.2 mmol/L (ref 3.5–5.1)
Sodium: 141 mmol/L (ref 135–145)

## 2019-04-21 MED ORDER — METOPROLOL TARTRATE 25 MG PO TABS: 12.5000 mg | ORAL_TABLET | Freq: Two times a day (BID) | ORAL | 0 refills | Status: DC

## 2019-04-21 MED FILL — METOPROLOL TARTRATE 25 MG T: 25 | 30 days supply | Qty: 30 | Fill #0

## 2019-04-21 NOTE — Assessment & Plan Note (Addendum)
Medications: amlodipine 10mg , metoprolol 12.5mg  BID.  HCTZ, metformin and lisinopril discontinued after last appointment 9/18 due to significant decline in renal function.  Denies any issues with lightheadedness, orthostasis or headaches since that time.  Blood pressure today is still higher than ideal on the low-dose metoprolol and amlodipine however I will hold off on any medication changes until he sees nephrology to avoid any hypoperfusion related injury Plan: Continue current regimen.  Refill for metoprolol sent to his pharmacy

## 2019-04-21 NOTE — Progress Notes (Signed)
   CC: Hypertension and chronic kidney disease HPI:  [name] is a 68 y.o. male who presents for follow-up on hypertension and renal function. Please see problem based assessment and plan for additional details.     Past Medical History:  Diagnosis Date  . Anemia   . Arthritis    past hx   . Blindness    right eye  . Cataract    removed both eyes  . Dehydration   . Diabetes ([place])   . Glaucoma   . History of CVA (cerebrovascular accident) 09/13/2015  . History of urinary retention   . Hyperlipidemia   . Hypertension   . Pernicious anemia 02/24/2018  . Stroke [facility])    2017- March  . Tachycardia 08/26/2017  . Tubular adenoma of colon 02/2017  . Weight loss, non-intentional 08/26/2017   10 lbs between 6/18 & 2/19    Review of Systems:  Review of Systems - General ROS: negative for - fever Ophthalmic ROS: negative for - double vision ENT ROS: negative for - nasal congestion, sinus pain, sneezing or sore throat Endocrine ROS: negative for - polydipsia/polyuria Respiratory ROS: no cough, shortness of breath, or wheezing Cardiovascular ROS: no chest pain or dyspnea on exertion Gastrointestinal ROS: no abdominal pain, change in bowel habits, or black or bloody stools Dermatological ROS: positive for head sweating negative for rash   Physical Exam:  Vitals:   04/21/19 1550  BP: (!) 144/70  Pulse: 74  SpO2: 100%  Weight: 167 lb 14.4 oz (76.2 kg)    GENERAL: Chronically ill-appearing, in no apparent distress HEENT: no conjunctival injection. Nares patent.  CARDIAC: heart regular rate and rhythm.  +1 edema to the lower extremities PULMONARY: lung sounds clear to auscultation ABDOMEN: bowel sounds active.  SKIN: no rash or lesion on limited exam NEURO: CN II-XII grossly intact   Assessment & Plan:   See Encounters Tab for problem based charting.  Pertinent labs & imaging results that were available during my care of the patient were reviewed by me and considered  in my medical decision making  Patient is in agreement with the plan and endorses no further questions at this time.  Patient seen with Dr. Elwanda Brooklyn, MD Internal Medicine Resident-PGY1 04/21/19

## 2019-04-21 NOTE — Patient Instructions (Signed)
It was a pleasure seeing you today Dennis Macias. After checking your kidney function, I think that it would be best to send you over to a kidney doctor to help with keeping your kidney function where it is or better. Continue to take the metoprolol. Do not take the metformin, lisinopril or hydrochlorothiazide. Avoid ibuprofen. You can take tylenol as you have been. You are more than welcome to reach out to your office if you have any questions in the meantime. I'd be more than happy to help answer them.  I would appreciate if you could come see me after the kidney doctor sees you so we can work on managing your blood sugars which will also help protect your kidneys. Please feel free to reach out me sooner if anything comes up in the meantime. You can contact me via MyChart or calling the clinic.  Take care!

## 2019-04-23 DIAGNOSIS — N184 Chronic kidney disease, stage 4 (severe): Secondary | ICD-10-CM | POA: Insufficient documentation

## 2019-04-23 NOTE — Assessment & Plan Note (Signed)
Patient is presenting to the clinic today for a recheck on his renal function.  Renal function has been steadily declining over the past year with the most significant drop in the last few months.  Was initially speculated to be attributable to some medication changes including addition of the hydrochlorothiazide back in July.  At last visit around 3 weeks ago, I had him discontinue the hydrochlorothiazide, lisinopril and metformin due to this decline in renal function.  I was hoping that renal function would improve at this week's appointment. GFR today is 20. Plan: Nephrology referral placed.  Discussed CKD diagnosis, treatment, and prognosis with the patient and his significant other.  Discussed nephrotoxic agents and importance of good diabetes and blood pressure control.  He and the significant other are, understandably, concerned that this will lead to dialysis.  We discussed how modifying risk factors can slow down the renal function decline.  They implied understanding and will follow-up with nephrology.  We will have him come back for a lab check in 3 to 4 weeks.

## 2019-04-23 NOTE — Assessment & Plan Note (Signed)
Given recent decline in renal function, metformin was discontinued at last visit in the middle of September.  Unfortunately in light believe that the patient has CKD 4 and she no longer be on metformin.  Will await his appointment with nephrology prior to starting any new medications.  His last A1c was 5.3 in July. Plan: A1c recheck in 1 month.  Further management pending those results

## 2019-04-23 NOTE — Progress Notes (Signed)
Internal Medicine Clinic Attending  I saw and evaluated the patient.  I personally confirmed the key portions of the history and exam documented by Dr. [name]   and I reviewed pertinent patient test results.  The assessment, diagnosis, and plan were formulated together and I agree with the documentation in the resident's note.  

## 2019-04-27 ENCOUNTER — Encounter: Payer: Self-pay | Admitting: Internal Medicine

## 2019-04-28 ENCOUNTER — Telehealth: Payer: Self-pay | Admitting: *Deleted

## 2019-04-28 ENCOUNTER — Encounter: Payer: Medicare PPO | Admitting: Internal Medicine

## 2019-04-28 NOTE — Telephone Encounter (Signed)
F/u with [facility].  Patient Referral has been sent to their office. Verified with the [facility] Referral system shared by [facility] office.  Referral has been sent into the review Process. Patient has been contacted this morning and given the phone number to contact their office if he has any questions.

## 2019-04-28 NOTE — Telephone Encounter (Signed)
RETURNED CALL TO  PATIENT REGARDING HIS NEPHROLOGY REFERRAL. SPOKE WITH OFFICE [phone]) PATIENT HAS NOT YET BEEN SCHEDULED. PATIENT INSTRUCTED TO CALL OFFICE BY Friday IF HE HAS NOT HEARD FROM OFFICE  BEFORE THEN.

## 2019-05-11 DIAGNOSIS — H4312 Vitreous hemorrhage, left eye: Secondary | ICD-10-CM | POA: Diagnosis not present

## 2019-05-11 DIAGNOSIS — E113512 Type 2 diabetes mellitus with proliferative diabetic retinopathy with macular edema, left eye: Secondary | ICD-10-CM | POA: Diagnosis not present

## 2019-05-11 DIAGNOSIS — H35372 Puckering of macula, left eye: Secondary | ICD-10-CM | POA: Diagnosis not present

## 2019-05-12 ENCOUNTER — Encounter: Payer: Self-pay | Admitting: Podiatry

## 2019-05-12 ENCOUNTER — Ambulatory Visit: Payer: Medicare PPO | Admitting: Podiatry

## 2019-05-12 ENCOUNTER — Other Ambulatory Visit: Payer: Self-pay

## 2019-05-12 DIAGNOSIS — M79675 Pain in left toe(s): Secondary | ICD-10-CM | POA: Diagnosis not present

## 2019-05-12 DIAGNOSIS — E1142 Type 2 diabetes mellitus with diabetic polyneuropathy: Secondary | ICD-10-CM

## 2019-05-12 DIAGNOSIS — M79674 Pain in right toe(s): Secondary | ICD-10-CM

## 2019-05-12 DIAGNOSIS — B351 Tinea unguium: Secondary | ICD-10-CM

## 2019-05-12 DIAGNOSIS — L84 Corns and callosities: Secondary | ICD-10-CM | POA: Diagnosis not present

## 2019-05-12 NOTE — Patient Instructions (Signed)
Diabetes Mellitus and Foot Care Foot care is an important part of your health, especially when you have diabetes. Diabetes may cause you to have problems because of poor blood flow (circulation) to your feet and legs, which can cause your skin to:  Become thinner and drier.  Break more easily.  Heal more slowly.  Peel and crack. You may also have nerve damage (neuropathy) in your legs and feet, causing decreased feeling in them. This means that you may not notice minor injuries to your feet that could lead to more serious problems. Noticing and addressing any potential problems early is the best way to prevent future foot problems. How to care for your feet Foot hygiene  Wash your feet daily with warm water and mild soap. Do not use hot water. Then, pat your feet and the areas between your toes until they are completely dry. Do not soak your feet as this can dry your skin.  Trim your toenails straight across. Do not dig under them or around the cuticle. File the edges of your nails with an emery board or nail file.  Apply a moisturizing lotion or petroleum jelly to the skin on your feet and to dry, brittle toenails. Use lotion that does not contain alcohol and is unscented. Do not apply lotion between your toes. Shoes and socks  Wear clean socks or stockings every day. Make sure they are not too tight. Do not wear knee-high stockings since they may decrease blood flow to your legs.  Wear shoes that fit properly and have enough cushioning. Always look in your shoes before you put them on to be sure there are no objects inside.  To break in new shoes, wear them for just a few hours a day. This prevents injuries on your feet. Wounds, scrapes, corns, and calluses  Check your feet daily for blisters, cuts, bruises, sores, and redness. If you cannot see the bottom of your feet, use a mirror or ask someone for help.  Do not cut corns or calluses or try to remove them with medicine.  If you  find a minor scrape, cut, or break in the skin on your feet, keep it and the skin around it clean and dry. You may clean these areas with mild soap and water. Do not clean the area with peroxide, alcohol, or iodine.  If you have a wound, scrape, corn, or callus on your foot, look at it several times a day to make sure it is healing and not infected. Check for: ? Redness, swelling, or pain. ? Fluid or blood. ? Warmth. ? Pus or a bad smell. General instructions  Do not cross your legs. This may decrease blood flow to your feet.  Do not use heating pads or hot water bottles on your feet. They may burn your skin. If you have lost feeling in your feet or legs, you may not know this is happening until it is too late.  Protect your feet from hot and cold by wearing shoes, such as at the beach or on hot pavement.  Schedule a complete foot exam at least once a year (annually) or more often if you have foot problems. If you have foot problems, report any cuts, sores, or bruises to your health care provider immediately. Contact a health care provider if:  You have a medical condition that increases your risk of infection and you have any cuts, sores, or bruises on your feet.  You have an injury that is not   healing.  You have redness on your legs or feet.  You feel burning or tingling in your legs or feet.  You have pain or cramps in your legs and feet.  Your legs or feet are numb.  Your feet always feel cold.  You have pain around a toenail. Get help right away if:  You have a wound, scrape, corn, or callus on your foot and: ? You have pain, swelling, or redness that gets worse. ? You have fluid or blood coming from the wound, scrape, corn, or callus. ? Your wound, scrape, corn, or callus feels warm to the touch. ? You have pus or a bad smell coming from the wound, scrape, corn, or callus. ? You have a fever. ? You have a red line going up your leg. Summary  Check your feet every day  for cuts, sores, red spots, swelling, and blisters.  Moisturize feet and legs daily.  Wear shoes that fit properly and have enough cushioning.  If you have foot problems, report any cuts, sores, or bruises to your health care provider immediately.  Schedule a complete foot exam at least once a year (annually) or more often if you have foot problems. This information is not intended to replace advice given to you by your health care provider. Make sure you discuss any questions you have with your health care provider. Document Released: 06/22/2000 Document Revised: 08/07/2017 Document Reviewed: 07/27/2016 Elsevier Patient Education  2020 Elsevier Inc.  

## 2019-05-17 NOTE — Progress Notes (Signed)
Subjective: Dennis Macias is a 68 y.o. y.o. male who presents today with cc of painful, discolored, thick toenails and painful calluses which interfere with daily activities. Pain is aggravated when wearing enclosed shoe gear and relieved with periodic professional debridement.  [name], MD is his PCP.   Current Outpatient Medications on File Prior to Visit  Medication Sig Dispense Refill  . ACCU-CHEK FASTCLIX LANCETS MISC Check blood sugar up to 7 times a week as instructed 102 each 3  . acetaminophen (TYLENOL) 500 MG tablet Take 500 mg by mouth every 6 (six) hours as needed.    Marland Kitchen amLODipine (NORVASC) 10 MG tablet Take 1 tablet (10 mg total) by mouth daily. 90 tablet 3  . aspirin 81 MG tablet Take 1 tablet (81 mg total) by mouth daily. Internal Medicine Program place on hold until patient requests to fill 90 tablet 3  . atorvastatin (LIPITOR) 80 MG tablet Take 1 tablet (80 mg total) by mouth at bedtime. IM program 90 tablet 3  . Blood Glucose Monitoring Suppl (ACCU-CHEK GUIDE) w/Device KIT 1 each by Does not apply route daily. Check blood sugar as instructed up to 7 times a week 1 kit 0  . dorzolamide-timolol (COSOPT) 22.3-6.8 MG/ML ophthalmic solution INSTILL 1 DROP INTO RIGHT EYE TWICE A DAY  10  . glucose blood (ACCU-CHEK GUIDE) test strip Check blood sugar up to 7 times a week as instructed 100 each 3  . metFORMIN (GLUCOPHAGE) 1000 MG tablet     . metoprolol tartrate (LOPRESSOR) 25 MG tablet Take 0.5 tablets (12.5 mg total) by mouth 2 (two) times daily. 30 tablet 0  . vitamin B-12 (CYANOCOBALAMIN) 100 MCG tablet Take 1 tablet (100 mcg total) by mouth daily. 100 tablet 3   No current facility-administered medications on file prior to visit.     No Known Allergies  Objective: There were no vitals filed for this visit.  Vascular Examination: Capillary refill time <3 seconds b/l.  Dorsalis pedis pulses faintly palpable b/l.  Posterior tibial pulses faintly palpable  b/l.  Digital hair absent x 10 digits.  Skin temperature gradient WNL b/l.  Dermatological Examination: Skin with normal turgor, texture and tone b/l.  No open wounds b/l.   Toenails 1-5 b/l discolored, thick, dystrophic with subungual debris and pain with palpation to nailbeds due to thickness of nails.  Hyperkeratotic lesion submet head 5 b/l with tenderness to palpation. No edema, no erythema, no drainage, no flocculence.  Musculoskeletal: Muscle strength 5/5 to all LE muscle groups b/l.   No pain, no crepitus or joint discomfort with active/passive ROM.  Neurological: Sensation absent b/l with 10 gram monofilament.  Vibratory sensation absent b/l.  Assessment: 1. Painful onychomycosis toenails 1-5 b/l 2.  Calluses submet head 5 b/l 3.  NIDDM with neuropathy  Plan: 1. Continue diabetic foot care principles. Literature dispensed on today. 2. Toenails 1-5 b/l were debrided in length and girth without iatrogenic bleeding. 3. Calluses pared submetatarsal heads 5 b/l utilizing sterile scalpel blade without incident.  4. Patient to continue soft, supportive shoe gear daily. 5. Patient to report any pedal injuries to medical professional immediately. 6. Follow up 3 months.  7. Patient/POA to call should there be a concern in the interim.

## 2019-05-21 DIAGNOSIS — I129 Hypertensive chronic kidney disease with stage 1 through stage 4 chronic kidney disease, or unspecified chronic kidney disease: Secondary | ICD-10-CM | POA: Diagnosis not present

## 2019-05-21 DIAGNOSIS — N189 Chronic kidney disease, unspecified: Secondary | ICD-10-CM | POA: Diagnosis not present

## 2019-05-21 DIAGNOSIS — I10 Essential (primary) hypertension: Secondary | ICD-10-CM | POA: Diagnosis not present

## 2019-05-21 DIAGNOSIS — N184 Chronic kidney disease, stage 4 (severe): Secondary | ICD-10-CM | POA: Diagnosis not present

## 2019-05-21 DIAGNOSIS — R319 Hematuria, unspecified: Secondary | ICD-10-CM | POA: Diagnosis not present

## 2019-05-21 DIAGNOSIS — R809 Proteinuria, unspecified: Secondary | ICD-10-CM | POA: Diagnosis not present

## 2019-05-21 DIAGNOSIS — D631 Anemia in chronic kidney disease: Secondary | ICD-10-CM | POA: Diagnosis not present

## 2019-05-25 DIAGNOSIS — I10 Essential (primary) hypertension: Secondary | ICD-10-CM | POA: Diagnosis not present

## 2019-05-25 DIAGNOSIS — N184 Chronic kidney disease, stage 4 (severe): Secondary | ICD-10-CM | POA: Diagnosis not present

## 2019-06-05 ENCOUNTER — Other Ambulatory Visit: Payer: Self-pay | Admitting: Internal Medicine

## 2019-06-05 DIAGNOSIS — I10 Essential (primary) hypertension: Secondary | ICD-10-CM

## 2019-06-05 DIAGNOSIS — N179 Acute kidney failure, unspecified: Secondary | ICD-10-CM

## 2019-06-08 ENCOUNTER — Other Ambulatory Visit: Payer: Self-pay | Admitting: Internal Medicine

## 2019-06-08 DIAGNOSIS — I10 Essential (primary) hypertension: Secondary | ICD-10-CM

## 2019-06-08 DIAGNOSIS — N179 Acute kidney failure, unspecified: Secondary | ICD-10-CM

## 2019-06-08 NOTE — Telephone Encounter (Signed)
Needs refill on metoprolol tartrate (LOPRESSOR) 25 MG tablet(Expired); pt contact [place], [place] - [address] [facility].

## 2019-06-09 ENCOUNTER — Telehealth: Payer: Self-pay | Admitting: Internal Medicine

## 2019-06-09 MED ORDER — METOPROLOL TARTRATE 25 MG PO TABS: 12.5000 mg | ORAL_TABLET | Freq: Two times a day (BID) | ORAL | 0 refills | Status: DC

## 2019-06-09 NOTE — Telephone Encounter (Signed)
Pt wife calling back regarding medicine [phone]

## 2019-06-10 MED FILL — METOPROLOL TARTRATE 25 MG T: 25 | 30 days supply | Qty: 30 | Fill #0

## 2019-06-10 NOTE — Telephone Encounter (Signed)
Could you please double check that he has not seen nephrology? I see a phone note in from October but he really needs to be seen by them. If he can't get in to see them soon, he should at least be seen in our clinic for a repeat BMP since his renal  function was worsening.

## 2019-06-15 ENCOUNTER — Ambulatory Visit (INDEPENDENT_AMBULATORY_CARE_PROVIDER_SITE_OTHER): Payer: Medicare PPO | Admitting: Internal Medicine

## 2019-06-15 ENCOUNTER — Other Ambulatory Visit: Payer: Self-pay

## 2019-06-15 VITALS — BP 126/73 | HR 65 | Temp 97.5°F | Ht 72.0 in | Wt 171.6 lb

## 2019-06-15 DIAGNOSIS — I129 Hypertensive chronic kidney disease with stage 1 through stage 4 chronic kidney disease, or unspecified chronic kidney disease: Secondary | ICD-10-CM | POA: Diagnosis not present

## 2019-06-15 DIAGNOSIS — E11319 Type 2 diabetes mellitus with unspecified diabetic retinopathy without macular edema: Secondary | ICD-10-CM

## 2019-06-15 DIAGNOSIS — E1122 Type 2 diabetes mellitus with diabetic chronic kidney disease: Secondary | ICD-10-CM | POA: Diagnosis not present

## 2019-06-15 DIAGNOSIS — E113512 Type 2 diabetes mellitus with proliferative diabetic retinopathy with macular edema, left eye: Secondary | ICD-10-CM | POA: Diagnosis not present

## 2019-06-15 DIAGNOSIS — N184 Chronic kidney disease, stage 4 (severe): Secondary | ICD-10-CM | POA: Diagnosis not present

## 2019-06-15 DIAGNOSIS — Z79899 Other long term (current) drug therapy: Secondary | ICD-10-CM | POA: Diagnosis not present

## 2019-06-15 DIAGNOSIS — H35372 Puckering of macula, left eye: Secondary | ICD-10-CM | POA: Diagnosis not present

## 2019-06-15 DIAGNOSIS — H4051X3 Glaucoma secondary to other eye disorders, right eye, severe stage: Secondary | ICD-10-CM | POA: Diagnosis not present

## 2019-06-15 DIAGNOSIS — I10 Essential (primary) hypertension: Secondary | ICD-10-CM

## 2019-06-15 LAB — HM DIABETES EYE EXAM

## 2019-06-15 LAB — GLUCOSE, CAPILLARY: Glucose-Capillary: 152 mg/dL — ABNORMAL HIGH (ref 70–99)

## 2019-06-15 LAB — POCT GLYCOSYLATED HEMOGLOBIN (HGB A1C): Hemoglobin A1C: 5.3 % (ref 4.0–5.6)

## 2019-06-15 NOTE — Assessment & Plan Note (Signed)
Patient with well-controlled hypertension today. He is currently on amlodipine 10 mg once daily and metoprolol tartrate 12.5 mg twice daily. He was previously on hydrochlorothiazide and lisinopril however these were discontinued due to worsening renal function. He is tolerating the metoprolol and the amlodipine well without any apparent side effects. He denies orthostatic symptoms.  A/P: - Continue amlodipine 10 mg once daily and metoprolol tartrate 12.5 mg twice daily

## 2019-06-15 NOTE — Progress Notes (Signed)
   CC: DM, CKD, HTN  HPI:  Dennis Macias is a 68 y.o. male with PMHx listed below presenting for DM, CKD, HTN. Please see the A&P for the status of the patient's chronic medical problems.  Past Medical History:  Diagnosis Date  . Anemia   . Arthritis    past hx   . Blindness    right eye  . Cataract    removed both eyes  . Dehydration   . Diabetes ([place])   . Glaucoma   . History of CVA (cerebrovascular accident) 09/13/2015  . History of urinary retention   . Hyperlipidemia   . Hypertension   . Pernicious anemia 02/24/2018  . Stroke [facility])    2017- March  . Tachycardia 08/26/2017  . Tubular adenoma of colon 02/2017  . Weight loss, non-intentional 08/26/2017   10 lbs between 6/18 & 2/19   Review of Systems:  Performed and all others negative.  Physical Exam: Vitals:   06/15/19 1459  BP: 126/73  Pulse: 65  Temp: (!) 97.5 F (36.4 C)  TempSrc: Oral  SpO2: 100%  Weight: 171 lb 9.6 oz (77.8 kg)  Height: 6' (1.829 m)   General: Well nourished male in no acute distress Pulm: Good air movement with no wheezing or crackles  CV: RRR, no murmurs, no rubs   Assessment & Plan:   See Encounters Tab for problem based charting.  Patient discussed with Dr. Heber Rudd

## 2019-06-15 NOTE — Patient Instructions (Signed)
Thank you for allowing [place] to provide your care. Today we're not making any changes to her medications. Please follow-up with [place] in three months after you've seen a kidney doctor. If you have any questions or concerns prior to your follow-up please do not hesitate to call us.

## 2019-06-15 NOTE — Assessment & Plan Note (Addendum)
Patient with well-controlled diabetes. He was previously on metformin however this was stopped due to worsening renal function. He does not check his blood sugars at home. He does not really follow a specific diet. He does not exercise. Repeat hemoglobin A1c today of 5.3.   A/P: - Well controlled off medications.  - Will hold off on any medication  - Continue to monitor A1c every 3-6 months

## 2019-06-15 NOTE — Assessment & Plan Note (Signed)
Patient presented to clinic for follow-up of his CKD. He recently went to nephrology's office for blood work and urine. Unfortunately we do not currently have access to these results. He is planning to follow up with them on 12/18. He is establishing with Dr. Hollie Salk.   The plan was for repeat labs today however given that he recently had labs at [facility] we will hold off. Will follow-up his clinic visit with Dr. Hollie Salk.

## 2019-06-17 NOTE — Progress Notes (Signed)
Internal Medicine Clinic Attending  Case discussed with Dr. Helberg at the time of the visit.  We reviewed the resident's history and exam and pertinent patient test results.  I agree with the assessment, diagnosis, and plan of care documented in the resident's note.    

## 2019-06-26 DIAGNOSIS — D631 Anemia in chronic kidney disease: Secondary | ICD-10-CM | POA: Diagnosis not present

## 2019-06-26 DIAGNOSIS — N189 Chronic kidney disease, unspecified: Secondary | ICD-10-CM | POA: Diagnosis not present

## 2019-06-26 DIAGNOSIS — N184 Chronic kidney disease, stage 4 (severe): Secondary | ICD-10-CM | POA: Diagnosis not present

## 2019-06-26 DIAGNOSIS — I129 Hypertensive chronic kidney disease with stage 1 through stage 4 chronic kidney disease, or unspecified chronic kidney disease: Secondary | ICD-10-CM | POA: Diagnosis not present

## 2019-06-26 DIAGNOSIS — E1122 Type 2 diabetes mellitus with diabetic chronic kidney disease: Secondary | ICD-10-CM | POA: Diagnosis not present

## 2019-06-26 DIAGNOSIS — E872 Acidosis: Secondary | ICD-10-CM | POA: Diagnosis not present

## 2019-06-29 MED FILL — SODIUM BICARB 10 GRAIN TAB: 650 | 90 days supply | Qty: 180 | Fill #0

## 2019-06-30 ENCOUNTER — Other Ambulatory Visit: Payer: Self-pay | Admitting: Nephrology

## 2019-06-30 ENCOUNTER — Encounter: Payer: Self-pay | Admitting: Dietician

## 2019-07-01 ENCOUNTER — Other Ambulatory Visit: Payer: Self-pay | Admitting: Nephrology

## 2019-07-01 DIAGNOSIS — N184 Chronic kidney disease, stage 4 (severe): Secondary | ICD-10-CM

## 2019-07-14 ENCOUNTER — Other Ambulatory Visit: Payer: Self-pay | Admitting: Internal Medicine

## 2019-07-14 ENCOUNTER — Ambulatory Visit
Admission: RE | Admit: 2019-07-14 | Discharge: 2019-07-14 | Disposition: A | Discharge status: Home / Self Care | Payer: Medicare PPO | Source: Ambulatory Visit | Attending: Nephrology | Admitting: Nephrology

## 2019-07-14 DIAGNOSIS — I10 Essential (primary) hypertension: Secondary | ICD-10-CM

## 2019-07-14 DIAGNOSIS — N184 Chronic kidney disease, stage 4 (severe): Secondary | ICD-10-CM

## 2019-07-14 DIAGNOSIS — N200 Calculus of kidney: Secondary | ICD-10-CM | POA: Diagnosis not present

## 2019-07-14 DIAGNOSIS — N179 Acute kidney failure, unspecified: Secondary | ICD-10-CM

## 2019-07-14 DIAGNOSIS — N189 Chronic kidney disease, unspecified: Secondary | ICD-10-CM | POA: Diagnosis not present

## 2019-07-15 MED ORDER — METOPROLOL TARTRATE 25 MG PO TABS: 12.5000 mg | ORAL_TABLET | Freq: Two times a day (BID) | ORAL | 5 refills | Status: DC

## 2019-07-15 MED FILL — METOPROLOL TARTRATE 25 MG T: 25 | 30 days supply | Qty: 30 | Fill #0

## 2019-07-16 DIAGNOSIS — R61 Generalized hyperhidrosis: Secondary | ICD-10-CM | POA: Diagnosis not present

## 2019-07-20 DIAGNOSIS — H4051X3 Glaucoma secondary to other eye disorders, right eye, severe stage: Secondary | ICD-10-CM | POA: Diagnosis not present

## 2019-07-20 DIAGNOSIS — E113513 Type 2 diabetes mellitus with proliferative diabetic retinopathy with macular edema, bilateral: Secondary | ICD-10-CM | POA: Diagnosis not present

## 2019-07-20 DIAGNOSIS — H35372 Puckering of macula, left eye: Secondary | ICD-10-CM | POA: Diagnosis not present

## 2019-07-20 DIAGNOSIS — E113512 Type 2 diabetes mellitus with proliferative diabetic retinopathy with macular edema, left eye: Secondary | ICD-10-CM | POA: Diagnosis not present

## 2019-07-21 ENCOUNTER — Ambulatory Visit (INDEPENDENT_AMBULATORY_CARE_PROVIDER_SITE_OTHER): Payer: Medicare PPO | Admitting: Internal Medicine

## 2019-07-21 ENCOUNTER — Other Ambulatory Visit: Payer: Self-pay

## 2019-07-21 VITALS — BP 139/72 | HR 61 | Temp 98.1°F | Ht 72.0 in | Wt 172.4 lb

## 2019-07-21 DIAGNOSIS — G629 Polyneuropathy, unspecified: Secondary | ICD-10-CM | POA: Diagnosis not present

## 2019-07-21 DIAGNOSIS — Z79899 Other long term (current) drug therapy: Secondary | ICD-10-CM | POA: Diagnosis not present

## 2019-07-21 DIAGNOSIS — Z6823 Body mass index (BMI) 23.0-23.9, adult: Secondary | ICD-10-CM | POA: Diagnosis not present

## 2019-07-21 DIAGNOSIS — R29898 Other symptoms and signs involving the musculoskeletal system: Secondary | ICD-10-CM

## 2019-07-21 DIAGNOSIS — N184 Chronic kidney disease, stage 4 (severe): Secondary | ICD-10-CM

## 2019-07-21 DIAGNOSIS — I10 Essential (primary) hypertension: Secondary | ICD-10-CM | POA: Diagnosis not present

## 2019-07-21 DIAGNOSIS — D649 Anemia, unspecified: Secondary | ICD-10-CM

## 2019-07-21 DIAGNOSIS — I129 Hypertensive chronic kidney disease with stage 1 through stage 4 chronic kidney disease, or unspecified chronic kidney disease: Secondary | ICD-10-CM | POA: Diagnosis not present

## 2019-07-21 DIAGNOSIS — R634 Abnormal weight loss: Secondary | ICD-10-CM

## 2019-07-21 DIAGNOSIS — E785 Hyperlipidemia, unspecified: Secondary | ICD-10-CM

## 2019-07-21 DIAGNOSIS — N2889 Other specified disorders of kidney and ureter: Secondary | ICD-10-CM | POA: Insufficient documentation

## 2019-07-21 DIAGNOSIS — N289 Disorder of kidney and ureter, unspecified: Secondary | ICD-10-CM

## 2019-07-21 DIAGNOSIS — E11319 Type 2 diabetes mellitus with unspecified diabetic retinopathy without macular edema: Secondary | ICD-10-CM

## 2019-07-21 LAB — BASIC METABOLIC PANEL
Anion gap: 6 (ref 5–15)
BUN: 22 mg/dL (ref 8–23)
CO2: 21 mmol/L — ABNORMAL LOW (ref 22–32)
Calcium: 8.7 mg/dL — ABNORMAL LOW (ref 8.9–10.3)
Chloride: 113 mmol/L — ABNORMAL HIGH (ref 98–111)
Creatinine, Ser: 3.62 mg/dL — ABNORMAL HIGH (ref 0.61–1.24)
GFR calc Af Amer: 19 mL/min — ABNORMAL LOW (ref >60)
GFR calc non Af Amer: 16 mL/min — ABNORMAL LOW (ref >60)
Glucose, Bld: 107 mg/dL — ABNORMAL HIGH (ref 70–99)
Potassium: 4.8 mmol/L (ref 3.5–5.1)
Sodium: 140 mmol/L (ref 135–145)

## 2019-07-21 LAB — CBC
HCT: 29.9 % — ABNORMAL LOW (ref 39.0–52.0)
Hemoglobin: 9.8 g/dL — ABNORMAL LOW (ref 13.0–17.0)
MCH: 29.9 pg (ref 26.0–34.0)
MCHC: 32.8 g/dL (ref 30.0–36.0)
MCV: 91.2 fL (ref 80.0–100.0)
Platelets: 327 10*3/uL (ref 150–400)
RBC: 3.28 MIL/uL — ABNORMAL LOW (ref 4.22–5.81)
RDW: 13.4 % (ref 11.5–15.5)
WBC: 5.7 10*3/uL (ref 4.0–10.5)
nRBC: 0 % (ref 0.0–0.2)

## 2019-07-21 LAB — GLUCOSE, CAPILLARY: Glucose-Capillary: 86 mg/dL (ref 70–99)

## 2019-07-21 LAB — SEDIMENTATION RATE: Sed Rate: 8 mm/hr (ref 0–16)

## 2019-07-21 NOTE — Patient Instructions (Signed)
It was great to see you again today.  In regards to your hip weakness, I have placed a referral to physical therapy to help with this. The kidney ultrasound that you had on the 5th did show a small lesion which will require further imaging. I will speak with your nephrologist and call you to let you know what she thinks. I will also call you with your lab work from today.

## 2019-07-21 NOTE — Assessment & Plan Note (Signed)
Repeat CBC today shows slight interval decrease in hemoglobin--9.8-- which is likely attributable to his renal disease. Last UA done Jan 2019. Would consider repeating this at his next appointment to re-evaluate for hematuria, especially in the setting of renal lesion.  Plan: will attempt to obtain UA at next clinic visit in a month. Will obtain iron panel at that time as well.

## 2019-07-21 NOTE — Addendum Note (Signed)
Addended by: Orson Gear on: 07/21/2019 04:39 PM   Modules accepted: Orders

## 2019-07-21 NOTE — Assessment & Plan Note (Addendum)
Medications:amlodipine 10mg , metoprolol 12.5mg  bid Pt reports compliance with medication.  BP noted to be well controlled today in office  Plan:continue current treatment regimen. F/u 3 mo.

## 2019-07-21 NOTE — Progress Notes (Signed)
   CC: hip weakness  HPI:  [name] is a 69 y.o. male who presents for evaluation of hip weakness. Please see problem based assessment and plan for additional details.  Past Medical History:  Diagnosis Date  . Anemia   . Arthritis    past hx   . Blindness    right eye  . Cataract    removed both eyes  . Dehydration   . Diabetes ([place])   . Glaucoma   . History of CVA (cerebrovascular accident) 09/13/2015  . History of urinary retention   . Hyperlipidemia   . Hypertension   . Pernicious anemia 02/24/2018  . Stroke [facility])    2017- March  . Tachycardia 08/26/2017  . Tubular adenoma of colon 02/2017  . Weight loss, non-intentional 08/26/2017   10 lbs between 6/18 & 2/19    Review of Systems:  Review of Systems - General ROS: negative for - chills or fever Genito-Urinary ROS: negative for - change in urinary stream, dysuria or hematuria Musculoskeletal ROS: positive for - muscular weakness negative for - muscle pain Neurological ROS: negative for - bowel and bladder control changes   Physical Exam:  Vitals:   07/21/19 0924  BP: 139/72  Pulse: 61  Temp: 98.1 F (36.7 C)  TempSrc: Oral  SpO2: 100%  Weight: 172 lb 6.4 oz (78.2 kg)  Height: 6' (1.829 m)    GENERAL: chronically ill appearing male  Cardiac: extremities warm to touch MSK: no tenderness to palpation. 4/5 strength of bilateral hip flexion. Remaining exam of upper and lower extremities 5/5 strength. Able to rise to standing position from chair independently and with minimal difficulty. ABDOMEN: non-distended SKIN: no rash NEURO: impaired sensation to pinprick of plantar surface of bilateral feet as well as medial ankle. Remainder of sensation intact. Proprioception of bilateral toes impaired.    Assessment & Plan:   See Encounters Tab for problem based charting.  Pertinent labs & imaging results that were available during my care of the patient were reviewed by me and considered in my medical decision  making  Patient is in agreement with the plan and endorses no further questions at this time.  Patient seen with Dr. Marylyn Ishihara, MD Internal Medicine Resident-PGY1 07/21/19

## 2019-07-21 NOTE — Assessment & Plan Note (Addendum)
Steady decline in renal function over the past year. I referred him to nephrology at last visit due to his progressing renal failure. Renal US performed in January 2021 shows a 1.5 x 1.6 x 1.5 cm focal area of echogenicity in the inferior left renal parenchyma which may represent a focal parenchymal heterogeneity or a solid mass.  Today, pt reports no urinary behavior changes or edema.  Repeat labs today show continued decline in renal function and slowly increasing potassium.   Plan:  -attempting to reach out to nephrology regarding MRI vs CT for renal mass and whether or not they would be comfortable with the risk associated with contrast.  -Continue to hold nephrotoxic agents. Will look to nephrology to assist with starting bicarb. -f/u 1 month with repeat BMP unless already done by [facility]

## 2019-07-21 NOTE — Assessment & Plan Note (Addendum)
Patient presented to the clinic for evaluation of 4-5d history of progressive difficulty with getting up from a seated position. Denies any other neurological changes. PE consistent with equal bilateral hip flexor weakness. No shoulder weakness appreciated. No tenderness to muscles. No significant neuro changes although he does have known chronic peripheral neuropathy which is present. No bladder or bowel changes. He was able to stand from the wheelchair with minimal effort however he and his wife note that he is having issues with rising from a lower height. No recent falls. Uses 4 point cane for ambulation  Assessment: most likely muscular deconditioning in nature. Myositis is another differential however ESR wnl today making this less likely. Given the equal bilateral nature of weakness, less likely to be CVA or other central involved pathology.  Plan: Referral to PT placed. F/u one month

## 2019-07-21 NOTE — Assessment & Plan Note (Addendum)
Weights are now improving. +10lb since 03/2019. Patient still endorsing poor appetite. Some of this gain may be due to clothing. Another concern would be related to his progressive CKD and potential for fluid retention.  Plan: will continue to monitor.

## 2019-07-22 DIAGNOSIS — H401121 Primary open-angle glaucoma, left eye, mild stage: Secondary | ICD-10-CM | POA: Diagnosis not present

## 2019-07-22 DIAGNOSIS — E11319 Type 2 diabetes mellitus with unspecified diabetic retinopathy without macular edema: Secondary | ICD-10-CM | POA: Diagnosis not present

## 2019-07-22 DIAGNOSIS — H4089 Other specified glaucoma: Secondary | ICD-10-CM | POA: Diagnosis not present

## 2019-07-22 DIAGNOSIS — Z961 Presence of intraocular lens: Secondary | ICD-10-CM | POA: Diagnosis not present

## 2019-07-22 LAB — HM DIABETES EYE EXAM

## 2019-07-22 MED ORDER — ATORVASTATIN CALCIUM 80 MG PO TABS: 80.0000 mg | ORAL_TABLET | Freq: Every day | ORAL | 3 refills | Status: DC

## 2019-07-22 MED ORDER — VITAMIN B-12 100 MCG PO TABS: 100.0000 ug | ORAL_TABLET | Freq: Every day | ORAL | 3 refills | Status: DC

## 2019-07-22 MED ORDER — AMLODIPINE BESYLATE 10 MG PO TABS: 10.0000 mg | ORAL_TABLET | Freq: Every day | ORAL | 3 refills | Status: DC

## 2019-07-22 MED FILL — ATORVASTATIN 80 MG TABLET: 80 | 30 days supply | Qty: 30 | Fill #0

## 2019-07-22 NOTE — Progress Notes (Signed)
Internal Medicine Clinic Attending  Case discussed with Dr. [name] at the time of the visit.  We reviewed the resident's history and exam and pertinent patient test results.  I agree with the assessment, diagnosis, and plan of care documented in the resident's note.    

## 2019-07-23 ENCOUNTER — Encounter: Payer: Self-pay | Admitting: *Deleted

## 2019-07-27 ENCOUNTER — Ambulatory Visit (HOSPITAL_COMMUNITY)
Admission: EM | Admit: 2019-07-27 | Discharge: 2019-07-27 | Disposition: A | Discharge status: Home / Self Care | Payer: Medicare PPO | Source: Home / Self Care

## 2019-07-27 ENCOUNTER — Emergency Department (HOSPITAL_COMMUNITY)
Admission: EM | Admit: 2019-07-27 | Discharge: 2019-07-28 | Disposition: A | Discharge status: Home / Self Care | Payer: Medicare PPO | Attending: Emergency Medicine | Admitting: Emergency Medicine

## 2019-07-27 ENCOUNTER — Emergency Department (HOSPITAL_COMMUNITY): Payer: Medicare PPO

## 2019-07-27 ENCOUNTER — Other Ambulatory Visit: Payer: Self-pay

## 2019-07-27 ENCOUNTER — Encounter (HOSPITAL_COMMUNITY): Payer: Self-pay

## 2019-07-27 DIAGNOSIS — Z79899 Other long term (current) drug therapy: Secondary | ICD-10-CM | POA: Diagnosis not present

## 2019-07-27 DIAGNOSIS — R29898 Other symptoms and signs involving the musculoskeletal system: Secondary | ICD-10-CM | POA: Diagnosis present

## 2019-07-27 DIAGNOSIS — M545 Low back pain: Secondary | ICD-10-CM | POA: Diagnosis not present

## 2019-07-27 DIAGNOSIS — Z87891 Personal history of nicotine dependence: Secondary | ICD-10-CM | POA: Insufficient documentation

## 2019-07-27 DIAGNOSIS — R4182 Altered mental status, unspecified: Secondary | ICD-10-CM | POA: Diagnosis not present

## 2019-07-27 DIAGNOSIS — G8929 Other chronic pain: Secondary | ICD-10-CM

## 2019-07-27 DIAGNOSIS — E1122 Type 2 diabetes mellitus with diabetic chronic kidney disease: Secondary | ICD-10-CM | POA: Diagnosis not present

## 2019-07-27 DIAGNOSIS — R531 Weakness: Secondary | ICD-10-CM | POA: Diagnosis not present

## 2019-07-27 DIAGNOSIS — N184 Chronic kidney disease, stage 4 (severe): Secondary | ICD-10-CM | POA: Diagnosis not present

## 2019-07-27 DIAGNOSIS — I129 Hypertensive chronic kidney disease with stage 1 through stage 4 chronic kidney disease, or unspecified chronic kidney disease: Secondary | ICD-10-CM | POA: Diagnosis not present

## 2019-07-27 DIAGNOSIS — Z7982 Long term (current) use of aspirin: Secondary | ICD-10-CM | POA: Diagnosis not present

## 2019-07-27 DIAGNOSIS — G459 Transient cerebral ischemic attack, unspecified: Secondary | ICD-10-CM | POA: Diagnosis not present

## 2019-07-27 LAB — COMPREHENSIVE METABOLIC PANEL
ALT: 17 U/L (ref 0–44)
AST: 22 U/L (ref 15–41)
Albumin: 2.3 g/dL — ABNORMAL LOW (ref 3.5–5.0)
Alkaline Phosphatase: 76 U/L (ref 38–126)
Anion gap: 11 (ref 5–15)
BUN: 22 mg/dL (ref 8–23)
CO2: 19 mmol/L — ABNORMAL LOW (ref 22–32)
Calcium: 8.6 mg/dL — ABNORMAL LOW (ref 8.9–10.3)
Chloride: 110 mmol/L (ref 98–111)
Creatinine, Ser: 3.58 mg/dL — ABNORMAL HIGH (ref 0.61–1.24)
GFR calc Af Amer: 19 mL/min — ABNORMAL LOW (ref >60)
GFR calc non Af Amer: 16 mL/min — ABNORMAL LOW (ref >60)
Glucose, Bld: 125 mg/dL — ABNORMAL HIGH (ref 70–99)
Potassium: 4.2 mmol/L (ref 3.5–5.1)
Sodium: 140 mmol/L (ref 135–145)
Total Bilirubin: 0.5 mg/dL (ref 0.3–1.2)
Total Protein: 5.7 g/dL — ABNORMAL LOW (ref 6.5–8.1)

## 2019-07-27 LAB — CBC
HCT: 29.6 % — ABNORMAL LOW (ref 39.0–52.0)
Hemoglobin: 9.7 g/dL — ABNORMAL LOW (ref 13.0–17.0)
MCH: 30 pg (ref 26.0–34.0)
MCHC: 32.8 g/dL (ref 30.0–36.0)
MCV: 91.6 fL (ref 80.0–100.0)
Platelets: 323 10*3/uL (ref 150–400)
RBC: 3.23 MIL/uL — ABNORMAL LOW (ref 4.22–5.81)
RDW: 13.7 % (ref 11.5–15.5)
WBC: 5.5 10*3/uL (ref 4.0–10.5)
nRBC: 0 % (ref 0.0–0.2)

## 2019-07-27 LAB — CBG MONITORING, ED
Glucose-Capillary: 106 mg/dL — ABNORMAL HIGH (ref 70–99)
Glucose-Capillary: 113 mg/dL — ABNORMAL HIGH (ref 70–99)

## 2019-07-27 MED ORDER — ACETAMINOPHEN 500 MG PO TABS
1000.0000 mg | ORAL_TABLET | Freq: Once | ORAL | Status: DC
  Administered 2019-07-27: 1000 mg via ORAL
  Filled 2019-07-27: qty 2

## 2019-07-27 MED ORDER — SODIUM CHLORIDE 0.9% FLUSH: 3.0000 mL | Freq: Once | INTRAVENOUS | Status: DC

## 2019-07-27 MED ORDER — SODIUM CHLORIDE 0.9 % IV BOLUS
1000.0000 mL | Freq: Once | INTRAVENOUS | Status: AC
  Administered 2019-07-28: 1000 mL via INTRAVENOUS

## 2019-07-27 NOTE — ED Triage Notes (Addendum)
Pt is having dizziness and back pain with confusion. Symptoms being going on for a week.

## 2019-07-27 NOTE — ED Notes (Signed)
Please keep blood sugar pt has eaten  Since yesterday

## 2019-07-27 NOTE — ED Triage Notes (Signed)
Pt here from [facility], recently dx with multiple health problems. Pt accompanied by family member who says his lower back has been hurting for a week so would like to have that checked, but family's concern is that he had a brief period of confusion and disorientation this morning. No one personally witnessed confusion, but he was talking to someone on the phone who noticed he was confused. EMS was called but pt not transported to hospital, as pt passed EMS stroke screen. Pt's symptoms totally resolved, A/O x 4 on arrival to ED.

## 2019-07-27 NOTE — ED Provider Notes (Addendum)
Dennis Macias   CSN: [id number] Arrival date & time: 07/27/19  1524     History Chief Complaint  Patient presents with  . Back Pain  . Altered Mental Status    Dennis Macias is a 69 y.o. male with history of HTN, CVA, DM who presents with back pain and AMS. History is provided by the patient and his partner Dennis Macias. He states that he's been having low back pain for a long time but it's been worse over the past couple of days. He states it feels "sore". He was seen by his PCP and it was thought his symptoms were due to weak hip muscles. Dennis Macias states that he has only been complaining of this recently. He's been having a lot of difficulty getting up from sitting to standing and going up stairs. He states it doesn't hurt all the time. He doesn't take anything for pain. Additionally this morning family noted that the patient wasn't "talking right". She talked to him around 9AM and he was normal at that time. Her son heard him talking on the phone and noted that they could understand him but it didn't make sense. Dennis Macias talked to him and asked him how he was doing and he said "I'm good but I don't know what to do about this woman and COVID". EMS was called around 1PM. He had a negative stroke screen for them. The patient denies fever, headache, dizziness, unilateral weakness, paresthesias, chest pain, SOB, abdominal pain.    HPI     Past Medical History:  Diagnosis Date  . Anemia   . Arthritis    past hx   . Blindness    right eye  . Cataract    removed both eyes  . Dehydration   . Diabetes ([place])   . Glaucoma   . History of CVA (cerebrovascular accident) 09/13/2015  . History of urinary retention   . Hyperlipidemia   . Hypertension   . Pernicious anemia 02/24/2018  . Stroke [facility])    2017- March  . Tachycardia 08/26/2017  . Tubular adenoma of colon 02/2017  . Weight loss, non-intentional 08/26/2017   10 lbs between 6/18 & 2/19    Patient  Active Problem List   Diagnosis Date Noted  . Renal lesion 07/21/2019  . CKD (chronic Macias disease) stage 4, GFR 15-29 ml/min (HCC) 04/23/2019  . Hip weakness 08/13/2018  . Pernicious anemia 02/24/2018  . Elevated PSA 10/22/2017  . Weight loss, non-intentional 08/26/2017  . 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 BPH with obstruction/lower urinary tract symptoms   . Normochromic anemia 12/21/2016  . 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 Hyperlipidemia 09/13/2015  . Diabetes mellitus with retinopathy of both eyes ([place])   . Essential hypertension     Past Surgical History:  Procedure Laterality Date  . CATARACT EXTRACTION, BILATERAL    . COLONOSCOPY    . POLYPECTOMY    . REFRACTIVE SURGERY  10/2017  . TEE WITHOUT CARDIOVERSION N/A 09/14/2015   Procedure: TRANSESOPHAGEAL ECHOCARDIOGRAM (TEE);  Surgeon: Larey Dresser, MD;  Location: [place];  Service: Cardiovascular;  Laterality: N/A;  . UPPER GASTROINTESTINAL ENDOSCOPY         Family History  Problem Relation Age of Onset  . Hypertension Mother   . Hyperlipidemia Mother   . Hyperlipidemia Father   . Colon cancer Neg Hx   . Colon polyps Neg Hx   . Esophageal cancer Neg Hx   .  Rectal cancer Neg Hx   . Stomach cancer Neg Hx     Social History   Tobacco Use  . Smoking status: Former Smoker  . Smokeless tobacco: Former User    Types: Chew    Quit date: 07/09/1978  . Tobacco comment: quit 1 year ago  Substance Use Topics  . Alcohol use: No    Alcohol/week: 1.0 standard drinks    Types: 1 Cans of beer per week    Comment: quit last march/2017  . Drug use: No    Home Medications Prior to Admission medications   Medication Sig Start Date End Date Taking? Authorizing Provider  ACCU-CHEK FASTCLIX LANCETS MISC Check blood sugar up to 7 times a week as instructed 08/13/18   Vogel, Marie S, MD  amLODipine (NORVASC) 10 MG tablet Take 1 tablet (10 mg total) by mouth daily. 07/22/19   [name],  Rylee, MD  aspirin 81 MG tablet Take 1 tablet (81 mg total) by mouth daily. Internal Medicine Program place on hold until patient requests to fill 01/27/16   Wallace, Andrew, DO  atorvastatin (LIPITOR) 80 MG tablet Take 1 tablet (80 mg total) by mouth at bedtime. IM program 07/22/19   [name], Rylee, MD  Blood Glucose Monitoring Suppl (ACCU-CHEK GUIDE) w/Device KIT 1 each by Does not apply route daily. Check blood sugar as instructed up to 7 times a week 08/13/18   Vogel, Marie S, MD  dorzolamide-timolol (COSOPT) 22.3-6.8 MG/ML ophthalmic solution INSTILL 1 DROP INTO RIGHT EYE TWICE A DAY 01/16/18   [provider]  glucose blood (ACCU-CHEK GUIDE) test strip Check blood sugar up to 7 times a week as instructed 08/13/18   Vogel, Marie S, MD  metoprolol tartrate (LOPRESSOR) 25 MG tablet Take 0.5 tablets (12.5 mg total) by mouth 2 (two) times daily. 07/15/19 08/14/19  [name], Rylee, MD    Allergies    Patient has no known allergies.  Review of Systems   Review of Systems  Constitutional: Negative for fever.  Respiratory: Negative for shortness of breath.   Cardiovascular: Negative for chest pain.  Gastrointestinal: Negative for abdominal pain.  Musculoskeletal: Positive for back pain.  Neurological: Positive for speech difficulty and weakness. Negative for dizziness, syncope, numbness and headaches.  All other systems reviewed and are negative.   Physical Exam Updated Vital Signs BP (!) 159/78   Pulse 63   Temp (!) 97.5 F (36.4 C) (Oral)   Resp 16   SpO2 100%   Physical Exam Vitals and nursing Macias reviewed.  Constitutional:      General: He is not in acute distress.    Appearance: He is well-developed. He is ill-appearing (chronically ill appearing).  HENT:     Head: Normocephalic and atraumatic.  Eyes:     General: No scleral icterus.       Right eye: No discharge.        Left eye: No discharge.     Conjunctiva/sclera: Conjunctivae normal.     Pupils: Pupils are equal,  round, and reactive to light.     Comments: Glaucoma of right eye  Cardiovascular:     Rate and Rhythm: Normal rate and regular rhythm.  Pulmonary:     Effort: Pulmonary effort is normal. No respiratory distress.     Breath sounds: Normal breath sounds.  Abdominal:     General: There is no distension.     Palpations: Abdomen is soft.     Tenderness: There is no abdominal tenderness.  Musculoskeletal:       Cervical back: Normal range of motion.  Skin:    General: Skin is warm and dry.  Neurological:     Mental Status: He is alert and oriented to person, place, and time.     Comments: Mental Status:  Alert, oriented, thought content appropriate. Poor historian. Speech fluent without evidence of aphasia. Able to follow 2 step commands with some mild difficulty.  Cranial Nerves:  II:  Peripheral visual fields grossly normal. Right pupil is non-reactive due to blindness. Left pupil is normal. III,IV, VI: ptosis not present, extra-ocular motions intact bilaterally  V,VII: smile symmetric, facial light touch sensation equal VIII: hearing grossly normal to voice  X: uvula elevates symmetrically  XI: bilateral shoulder shrug symmetric and strong XII: midline tongue extension without fassiculations Motor:  Normal tone. 4/5 proximal weakness in the upper extremities. 4/5 proximal weakness in lower extremities. Sensory: Pinprick and light touch normal in all extremities.  Deep Tendon Reflexes: 2+ and symmetric in the biceps and patella Cerebellar: normal finger-to-nose with bilateral upper extremities Gait: Ambulatory with mildly antalgic gait with cane CV: distal pulses palpable throughout    Psychiatric:        Behavior: Behavior normal.     ED Results / Procedures / Treatments   Labs (all labs ordered are listed, but only abnormal results are displayed) Labs Reviewed  COMPREHENSIVE METABOLIC PANEL - Abnormal; Notable for the following components:      Result Value   CO2 19 (*)     Glucose, Bld 125 (*)    Creatinine, Ser 3.58 (*)    Calcium 8.6 (*)    Total Protein 5.7 (*)    Albumin 2.3 (*)    GFR calc non Af Amer 16 (*)    GFR calc Af Amer 19 (*)    All other components within normal limits  CBC - Abnormal; Notable for the following components:   RBC 3.23 (*)    Hemoglobin 9.7 (*)    HCT 29.6 (*)    All other components within normal limits  CBG MONITORING, ED - Abnormal; Notable for the following components:   Glucose-Capillary 106 (*)    All other components within normal limits  CBG MONITORING, ED - Abnormal; Notable for the following components:   Glucose-Capillary 113 (*)    All other components within normal limits  ETHANOL  URINALYSIS, ROUTINE W REFLEX MICROSCOPIC  RAPID URINE DRUG SCREEN, HOSP PERFORMED  CBG MONITORING, ED    EKG None  Radiology DG Lumbar Spine Complete  Result Date: 07/27/2019 CLINICAL DATA:  Back pain EXAM: LUMBAR SPINE - COMPLETE 4+ VIEW COMPARISON:  CT 11/11/2017 FINDINGS: Five non rib-bearing lumbar type vertebra. Lumbar alignment within normal limits. Vertebral body heights are normal. Mild degenerative changes at L3-L4 and L4-L5. Posterior facet degenerative change of the lower spine. Aortic atherosclerosis. IMPRESSION: No acute osseous abnormality.  Mild degenerative change Electronically Signed   By: Kim  Fujinaga M.D.   On: 07/27/2019 23:36   CT Head Wo Contrast  Result Date: 07/27/2019 CLINICAL DATA:  TIA EXAM: CT HEAD WITHOUT CONTRAST TECHNIQUE: Contiguous axial images were obtained from the base of the skull through the vertex without intravenous contrast. COMPARISON:  Head CT 02/21/2017 FINDINGS: Brain: There is no mass, hemorrhage or extra-axial collection. There is generalized atrophy without lobar predilection. Hypodensity of the white matter is most commonly associated with chronic microvascular disease. Vascular: No abnormal hyperdensity of the major intracranial arteries or dural venous sinuses. No intracranial  atherosclerosis. Skull: The visualized skull base,   calvarium and extracranial soft tissues are normal. Sinuses/Orbits: No fluid levels or advanced mucosal thickening of the visualized paranasal sinuses. No mastoid or middle ear effusion. The orbits are normal. IMPRESSION: Chronic small vessel disease and generalized volume loss without acute intracranial abnormality. Electronically Signed   By: Ulyses Jarred M.D.   On: 07/27/2019 23:13    Procedures Procedures (including critical care time)  Medications Ordered in ED Medications  sodium chloride flush (NS) 0.9 % injection 3 mL (has no administration in time range)  sodium chloride 0.9 % bolus 1,000 mL (has no administration in time range)  acetaminophen (TYLENOL) tablet 1,000 mg (has no administration in time range)    ED Course  I have reviewed the triage vital signs and the nursing notes.  Pertinent labs & imaging results that were available during my care of the patient were reviewed by me and considered in my medical decision making (see chart for details).  69 year old male presents with episode of confusion at home which has resolved and back pain which sounds like a more chronic issue and is being worked up by PCP. On exam he has mild weakness of the upper and lower extremities which is more proximal and bilateral. I do not Macias any speech difficulties and although is slowed, he is alert, oriented, and answering questions appropriately. DDx: CVA, chronic back pain, PMR, vitamin deficiency. CBC is remarkable for anemia which is around his baseline. CMP is remarkable for low CO2 (19), elevated SCr (3.5), low Ca (8.6), low protein. Will add CT head and xray lumbar spine.  CT head and xray are overall negative. Shared visit with Dr. Leonette Monarch. Will discuss with IM team  IM to come see. Care signed out to R Allene Pyo  MDM Rules/Calculators/A&P                     Final Clinical Impression(s) / ED Diagnoses Final diagnoses:  Altered  mental status, unspecified altered mental status type  Chronic low back pain without sciatica, unspecified back pain laterality    Rx / DC Orders ED Discharge Orders    None       Recardo Evangelist, PA-C 07/28/19 0021    Recardo Evangelist, PA-C 07/28/19 0022    Fatima Blank, MD 07/28/19 [phone]

## 2019-07-28 LAB — ETHANOL: Alcohol, Ethyl (B): <10 mg/dL (ref <10)

## 2019-07-28 NOTE — ED Provider Notes (Signed)
Patient seen by internal medicine.  Recommend discharge.  No acute findings on neuro exam tonight.  CT negative.  Labs essentially at recent baseline.  IM Recommends outpatient follow-up in clinic.  Likely needs PT/OT.  Discussed with Dr. Leonette Monarch, who agrees with plan.   Montine Circle, PA-C 07/28/19 0122    Fatima Blank, MD 07/28/19 [phone]

## 2019-07-28 NOTE — Consult Note (Addendum)
Date: 07/28/2019               Patient Name:  Dennis Macias MRN: [id number]  DOB: Nov 26, 1950 Age / Sex: 69 y.o., male   PCP: [name], MD         Requesting Physician: Dr. Leonette Monarch, Grayce Sessions, *    Consulting Reason:  Weakness     Chief Complaint: Weakness  History of Present Illness: Patient is a 69 year old male with PMH of HTN, CVA, DM who presented right back/flank pain. Patient reports the pain comes and goes and has been going on for several months and gradually worsening. He states this pain makes it difficult for him to walk. Patient is unable to further characterize the pain. He denies weakness, dizziness, or changes in sensation. Patient denies changes in bowel or bladder control. He reports that he gets around at home with a power chair and walker. Patient denies cough, congestion, chest pain, shortness of breath, nausea, vomiting, dysuria, or any other symptoms.  Per chart review, patient's partner states that he has had difficulty getting up from sitting to standing and going up stairs.  Meds: Current Facility-Administered Medications  Medication Dose Route Frequency Provider Last Rate Last Admin  . sodium chloride flush (NS) 0.9 % injection 3 mL  3 mL Intravenous Once Varney Biles, MD       Current Outpatient Medications  Medication Sig Dispense Refill  . amLODipine (NORVASC) 10 MG tablet Take 1 tablet (10 mg total) by mouth daily. 90 tablet 3  . aspirin 81 MG tablet Take 1 tablet (81 mg total) by mouth daily. Internal Medicine Program place on hold until patient requests to fill 90 tablet 3  . atorvastatin (LIPITOR) 80 MG tablet Take 1 tablet (80 mg total) by mouth at bedtime. IM program 90 tablet 3  . dorzolamide-timolol (COSOPT) 22.3-6.8 MG/ML ophthalmic solution Place 1 drop into the right eye 2 (two) times daily.   10  . metoprolol tartrate (LOPRESSOR) 25 MG tablet Take 0.5 tablets (12.5 mg total) by mouth 2 (two) times daily. 30 tablet 5  . sodium bicarbonate  650 MG tablet Take 650 mg by mouth 2 (two) times daily.    . vitamin B-12 (CYANOCOBALAMIN) 1000 MCG tablet Take 1,000 mcg by mouth daily.    Marland Kitchen ACCU-CHEK FASTCLIX LANCETS MISC Check blood sugar up to 7 times a week as instructed 102 each 3  . Blood Glucose Monitoring Suppl (ACCU-CHEK GUIDE) w/Device KIT 1 each by Does not apply route daily. Check blood sugar as instructed up to 7 times a week 1 kit 0  . glucose blood (ACCU-CHEK GUIDE) test strip Check blood sugar up to 7 times a week as instructed 100 each 3    Allergies: Allergies as of 07/27/2019  . (No Known Allergies)   Past Medical History:  Diagnosis Date  . Anemia   . Arthritis    past hx   . Blindness    right eye  . Cataract    removed both eyes  . Dehydration   . Diabetes ([place])   . Glaucoma   . History of CVA (cerebrovascular accident) 09/13/2015  . History of urinary retention   . Hyperlipidemia   . Hypertension   . Pernicious anemia 02/24/2018  . Stroke [facility])    2017- March  . Tachycardia 08/26/2017  . Tubular adenoma of colon 02/2017  . Weight loss, non-intentional 08/26/2017   10 lbs between 6/18 & 2/19   Past Surgical History:  Procedure  Laterality Date  . CATARACT EXTRACTION, BILATERAL    . COLONOSCOPY    . POLYPECTOMY    . REFRACTIVE SURGERY  10/2017  . TEE WITHOUT CARDIOVERSION N/A 09/14/2015   Procedure: TRANSESOPHAGEAL ECHOCARDIOGRAM (TEE);  Surgeon: Larey Dresser, MD;  Location: [place];  Service: Cardiovascular;  Laterality: N/A;  . UPPER GASTROINTESTINAL ENDOSCOPY     Family History  Problem Relation Age of Onset  . Hypertension Mother   . Hyperlipidemia Mother   . Hyperlipidemia Father   . Colon cancer Neg Hx   . Colon polyps Neg Hx   . Esophageal cancer Neg Hx   . Rectal cancer Neg Hx   . Stomach cancer Neg Hx    Social History   Socioeconomic History  . Marital status: Married    Spouse name: Not on file  . Number of children: Not on file  . Years of education: Not on file  .  Highest education level: Not on file  Occupational History  . Not on file  Tobacco Use  . Smoking status: Former Research scientist (life sciences)  . Smokeless tobacco: Former Systems developer    Types: Chew    Quit date: 07/09/1978  . Tobacco comment: quit 1 year ago  Substance and Sexual Activity  . Alcohol use: No    Alcohol/week: 1.0 standard drinks    Types: 1 Cans of beer per week    Comment: quit last march/2017  . Drug use: No  . Sexual activity: Not on file  Other Topics Concern  . Not on file  Social History Narrative  . Not on file   Social Determinants of Health   Financial Resource Strain:   . Difficulty of Paying Living Expenses: Not on file  Food Insecurity:   . Worried About Charity fundraiser in the Last Year: Not on file  . Ran Out of Food in the Last Year: Not on file  Transportation Needs:   . Lack of Transportation (Medical): Not on file  . Lack of Transportation (Non-Medical): Not on file  Physical Activity:   . Days of Exercise per Week: Not on file  . Minutes of Exercise per Session: Not on file  Stress:   . Feeling of Stress : Not on file  Social Connections:   . Frequency of Communication with Friends and Family: Not on file  . Frequency of Social Gatherings with Friends and Family: Not on file  . Attends Religious Services: Not on file  . Active Member of Clubs or Organizations: Not on file  . Attends Archivist Meetings: Not on file  . Marital Status: Not on file  Intimate Partner Violence:   . Fear of Current or Ex-Partner: Not on file  . Emotionally Abused: Not on file  . Physically Abused: Not on file  . Sexually Abused: Not on file    Review of Systems: A complete review of systems was negative other than as stated in HPI.  Physical Exam: Blood pressure (!) 157/80, pulse 61, temperature (!) 97.5 F (36.4 C), temperature source Oral, resp. rate 18, SpO2 100 %. Physical Exam  Constitutional: He is well-developed, well-nourished, and in no distress.  HENT:    Head: Normocephalic and atraumatic.  Eyes: EOM are normal. Right eye exhibits no discharge. Left eye exhibits no discharge.  Neck: No tracheal deviation present.  Cardiovascular: Normal rate and regular rhythm. Exam reveals no gallop and no friction rub.  No murmur heard. Pulmonary/Chest: Effort normal and breath sounds normal. No respiratory distress.  He has no wheezes. He has no rales.  Abdominal: Soft. He exhibits no distension. There is no abdominal tenderness. There is no rebound and no guarding.  Musculoskeletal:        General: No tenderness, deformity or edema. Normal range of motion.     Cervical back: Normal range of motion.     Comments: No CVA tenderness  Neurological: He is alert. No cranial nerve deficit. Coordination normal.  5/5 strength in proximal/distal upper extremity. 5/5 lower extremity strength at knee, ankle. Hip flexors with mild symmetrical weakness demonstrated on walk test  Skin: Skin is warm and dry. No rash noted. He is not diaphoretic. No erythema.  Psychiatric: Memory and judgment normal.    Lab results: CBC Latest Ref Rng & Units 07/27/2019 07/21/2019 02/24/2018  WBC 4.0 - 10.5 K/uL 5.5 5.7 7.2  Hemoglobin 13.0 - 17.0 g/dL 9.7(L) 9.8(L) 10.3(L)  Hematocrit 39.0 - 52.0 % 29.6(L) 29.9(L) 30.0(L)  Platelets 150 - 400 K/uL 323 327 354   BMP Latest Ref Rng & Units 07/27/2019 07/21/2019 04/21/2019  Glucose 70 - 99 mg/dL 125(H) 107(H) 188(H)  BUN 8 - 23 mg/dL 22 22 19   Creatinine 0.61 - 1.24 mg/dL 3.58(H) 3.62(H) 3.12(H)  BUN/Creat Ratio 10 - 24 - - -  Sodium 135 - 145 mmol/L 140 140 141  Potassium 3.5 - 5.1 mmol/L 4.2 4.8 4.2  Chloride 98 - 111 mmol/L 110 113(H) 113(H)  CO2 22 - 32 mmol/L 19(L) 21(L) 21(L)  Calcium 8.9 - 10.3 mg/dL 8.6(L) 8.7(L) 8.3(L)    Imaging results:  DG Lumbar Spine Complete  Result Date: 07/27/2019 CLINICAL DATA:  Back pain EXAM: LUMBAR SPINE - COMPLETE 4+ VIEW COMPARISON:  CT 11/11/2017 FINDINGS: Five non rib-bearing lumbar type  vertebra. Lumbar alignment within normal limits. Vertebral body heights are normal. Mild degenerative changes at L3-L4 and L4-L5. Posterior facet degenerative change of the lower spine. Aortic atherosclerosis. IMPRESSION: No acute osseous abnormality.  Mild degenerative change Electronically Signed   By: Donavan Foil M.D.   On: 07/27/2019 23:36   CT Head Wo Contrast  Result Date: 07/27/2019 CLINICAL DATA:  TIA EXAM: CT HEAD WITHOUT CONTRAST TECHNIQUE: Contiguous axial images were obtained from the base of the skull through the vertex without intravenous contrast. COMPARISON:  Head CT 02/21/2017 FINDINGS: Brain: There is no mass, hemorrhage or extra-axial collection. There is generalized atrophy without lobar predilection. Hypodensity of the white matter is most commonly associated with chronic microvascular disease. Vascular: No abnormal hyperdensity of the major intracranial arteries or dural venous sinuses. No intracranial atherosclerosis. Skull: The visualized skull base, calvarium and extracranial soft tissues are normal. Sinuses/Orbits: No fluid levels or advanced mucosal thickening of the visualized paranasal sinuses. No mastoid or middle ear effusion. The orbits are normal. IMPRESSION: Chronic small vessel disease and generalized volume loss without acute intracranial abnormality. Electronically Signed   By: Ulyses Jarred M.D.   On: 07/27/2019 23:13    Other results: EKG: Not performed  CBC Latest Ref Rng & Units 07/27/2019 07/21/2019 02/24/2018  WBC 4.0 - 10.5 K/uL 5.5 5.7 7.2  Hemoglobin 13.0 - 17.0 g/dL 9.7(L) 9.8(L) 10.3(L)  Hematocrit 39.0 - 52.0 % 29.6(L) 29.9(L) 30.0(L)  Platelets 150 - 400 K/uL 323 327 354   BMP Latest Ref Rng & Units 07/27/2019 07/21/2019 04/21/2019  Glucose 70 - 99 mg/dL 125(H) 107(H) 188(H)  BUN 8 - 23 mg/dL 22 22 19   Creatinine 0.61 - 1.24 mg/dL 3.58(H) 3.62(H) 3.12(H)  BUN/Creat Ratio 10 - 24 - - -  Sodium 135 - 145 mmol/L 140 140 141  Potassium 3.5 - 5.1 mmol/L  4.2 4.8 4.2  Chloride 98 - 111 mmol/L 110 113(H) 113(H)  CO2 22 - 32 mmol/L 19(L) 21(L) 21(L)  Calcium 8.9 - 10.3 mg/dL 8.6(L) 8.7(L) 8.3(L)     Assessment, Plan, & Recommendations by Problem: Active Problems:   Hip weakness  Patient is a 69 year old male with PMH of HTN, CVA, DM who presented with right back pain.  # Right back/flank pain: # Hip weakness: Patient presenting with a several month history of back pain and hip weakness. CT head WO contrast was without acute intracranial abnormality and DG lumbar spine showed no acute osseous abnormality. A renal ultrasound on 07/14/19 was also non-revealing as to cause of patient's pain. Patient's neuro exam is within normal limits other than symmetric weakness of hip flexors. Patient was evaluated for similar functional decline at office visit on 07/21/19 and was referred to outpatient physical therapy. Per patient, he has not yet been called to schedule this. *  Workup is unremarkable and patient is without acute abnormality necessitating inpatient admission. * Recommend outpatient followup and physical therapy   Signed: Jeanmarie Hubert, MD 07/28/2019, 1:36 AM

## 2019-07-28 NOTE — Discharge Instructions (Signed)
Please follow-up with the internal medicine team.

## 2019-07-28 NOTE — ED Notes (Signed)
Patient verbalizes understanding of discharge instructions. Opportunity for questioning and answers were provided. Armband removed by staff, pt discharged from ED.  

## 2019-07-29 ENCOUNTER — Encounter: Payer: Self-pay | Admitting: *Deleted

## 2019-07-30 ENCOUNTER — Encounter: Payer: Self-pay | Admitting: Internal Medicine

## 2019-07-30 ENCOUNTER — Other Ambulatory Visit: Payer: Self-pay | Admitting: Internal Medicine

## 2019-07-30 DIAGNOSIS — R29898 Other symptoms and signs involving the musculoskeletal system: Secondary | ICD-10-CM

## 2019-07-31 ENCOUNTER — Emergency Department (HOSPITAL_COMMUNITY): Payer: Medicare Other

## 2019-07-31 ENCOUNTER — Other Ambulatory Visit: Payer: Self-pay

## 2019-07-31 ENCOUNTER — Encounter (HOSPITAL_COMMUNITY): Payer: Self-pay | Admitting: Pharmacy Technician

## 2019-07-31 ENCOUNTER — Inpatient Hospital Stay (HOSPITAL_COMMUNITY)
Admission: EM | Admit: 2019-07-31 | Discharge: 2019-08-02 | DRG: 696 | Disposition: A | Discharge status: Home Health | Payer: Medicare Other | Attending: Internal Medicine | Admitting: Internal Medicine

## 2019-07-31 DIAGNOSIS — E441 Mild protein-calorie malnutrition: Secondary | ICD-10-CM | POA: Diagnosis present

## 2019-07-31 DIAGNOSIS — R319 Hematuria, unspecified: Secondary | ICD-10-CM | POA: Diagnosis present

## 2019-07-31 DIAGNOSIS — I10 Essential (primary) hypertension: Secondary | ICD-10-CM | POA: Diagnosis not present

## 2019-07-31 DIAGNOSIS — Z87891 Personal history of nicotine dependence: Secondary | ICD-10-CM | POA: Diagnosis not present

## 2019-07-31 DIAGNOSIS — N429 Disorder of prostate, unspecified: Secondary | ICD-10-CM | POA: Diagnosis not present

## 2019-07-31 DIAGNOSIS — M79631 Pain in right forearm: Secondary | ICD-10-CM | POA: Diagnosis not present

## 2019-07-31 DIAGNOSIS — Y92480 Sidewalk as the place of occurrence of the external cause: Secondary | ICD-10-CM | POA: Diagnosis not present

## 2019-07-31 DIAGNOSIS — H5461 Unqualified visual loss, right eye, normal vision left eye: Secondary | ICD-10-CM | POA: Diagnosis not present

## 2019-07-31 DIAGNOSIS — N184 Chronic kidney disease, stage 4 (severe): Secondary | ICD-10-CM | POA: Diagnosis present

## 2019-07-31 DIAGNOSIS — R972 Elevated prostate specific antigen [PSA]: Secondary | ICD-10-CM | POA: Diagnosis not present

## 2019-07-31 DIAGNOSIS — H269 Unspecified cataract: Secondary | ICD-10-CM | POA: Diagnosis present

## 2019-07-31 DIAGNOSIS — N2889 Other specified disorders of kidney and ureter: Secondary | ICD-10-CM | POA: Diagnosis not present

## 2019-07-31 DIAGNOSIS — S59912A Unspecified injury of left forearm, initial encounter: Secondary | ICD-10-CM | POA: Diagnosis not present

## 2019-07-31 DIAGNOSIS — E11319 Type 2 diabetes mellitus with unspecified diabetic retinopathy without macular edema: Secondary | ICD-10-CM | POA: Diagnosis present

## 2019-07-31 DIAGNOSIS — Z8249 Family history of ischemic heart disease and other diseases of the circulatory system: Secondary | ICD-10-CM | POA: Diagnosis not present

## 2019-07-31 DIAGNOSIS — D649 Anemia, unspecified: Secondary | ICD-10-CM | POA: Diagnosis present

## 2019-07-31 DIAGNOSIS — S299XXA Unspecified injury of thorax, initial encounter: Secondary | ICD-10-CM | POA: Diagnosis not present

## 2019-07-31 DIAGNOSIS — Z20822 Contact with and (suspected) exposure to covid-19: Secondary | ICD-10-CM | POA: Diagnosis not present

## 2019-07-31 DIAGNOSIS — E1122 Type 2 diabetes mellitus with diabetic chronic kidney disease: Secondary | ICD-10-CM | POA: Diagnosis present

## 2019-07-31 DIAGNOSIS — R296 Repeated falls: Secondary | ICD-10-CM | POA: Diagnosis present

## 2019-07-31 DIAGNOSIS — I129 Hypertensive chronic kidney disease with stage 1 through stage 4 chronic kidney disease, or unspecified chronic kidney disease: Secondary | ICD-10-CM | POA: Diagnosis present

## 2019-07-31 DIAGNOSIS — Z8349 Family history of other endocrine, nutritional and metabolic diseases: Secondary | ICD-10-CM

## 2019-07-31 DIAGNOSIS — E46 Unspecified protein-calorie malnutrition: Secondary | ICD-10-CM | POA: Diagnosis present

## 2019-07-31 DIAGNOSIS — R339 Retention of urine, unspecified: Secondary | ICD-10-CM | POA: Diagnosis not present

## 2019-07-31 DIAGNOSIS — H409 Unspecified glaucoma: Secondary | ICD-10-CM | POA: Diagnosis present

## 2019-07-31 DIAGNOSIS — Z7982 Long term (current) use of aspirin: Secondary | ICD-10-CM

## 2019-07-31 DIAGNOSIS — W19XXXA Unspecified fall, initial encounter: Secondary | ICD-10-CM | POA: Diagnosis present

## 2019-07-31 DIAGNOSIS — G8929 Other chronic pain: Secondary | ICD-10-CM | POA: Diagnosis present

## 2019-07-31 DIAGNOSIS — Z79899 Other long term (current) drug therapy: Secondary | ICD-10-CM

## 2019-07-31 DIAGNOSIS — R6881 Early satiety: Secondary | ICD-10-CM | POA: Diagnosis present

## 2019-07-31 DIAGNOSIS — Z8673 Personal history of transient ischemic attack (TIA), and cerebral infarction without residual deficits: Secondary | ICD-10-CM

## 2019-07-31 DIAGNOSIS — W010XXA Fall on same level from slipping, tripping and stumbling without subsequent striking against object, initial encounter: Secondary | ICD-10-CM | POA: Diagnosis present

## 2019-07-31 DIAGNOSIS — E785 Hyperlipidemia, unspecified: Secondary | ICD-10-CM | POA: Diagnosis not present

## 2019-07-31 DIAGNOSIS — R531 Weakness: Secondary | ICD-10-CM | POA: Diagnosis not present

## 2019-07-31 DIAGNOSIS — R29898 Other symptoms and signs involving the musculoskeletal system: Secondary | ICD-10-CM

## 2019-07-31 DIAGNOSIS — S199XXA Unspecified injury of neck, initial encounter: Secondary | ICD-10-CM | POA: Diagnosis not present

## 2019-07-31 DIAGNOSIS — R4182 Altered mental status, unspecified: Secondary | ICD-10-CM | POA: Diagnosis not present

## 2019-07-31 DIAGNOSIS — S59911A Unspecified injury of right forearm, initial encounter: Secondary | ICD-10-CM | POA: Diagnosis not present

## 2019-07-31 DIAGNOSIS — R634 Abnormal weight loss: Secondary | ICD-10-CM | POA: Diagnosis present

## 2019-07-31 DIAGNOSIS — S0003XA Contusion of scalp, initial encounter: Secondary | ICD-10-CM | POA: Diagnosis not present

## 2019-07-31 DIAGNOSIS — R58 Hemorrhage, not elsewhere classified: Secondary | ICD-10-CM | POA: Diagnosis not present

## 2019-07-31 LAB — CBC WITH DIFFERENTIAL/PLATELET
Abs Immature Granulocytes: 0.02 10*3/uL (ref 0.00–0.07)
Basophils Absolute: 0 10*3/uL (ref 0.0–0.1)
Basophils Relative: 1 %
Eosinophils Absolute: 0.1 10*3/uL (ref 0.0–0.5)
Eosinophils Relative: 1 %
HCT: 30.8 % — ABNORMAL LOW (ref 39.0–52.0)
Hemoglobin: 9.9 g/dL — ABNORMAL LOW (ref 13.0–17.0)
Immature Granulocytes: 0 %
Lymphocytes Relative: 23 %
Lymphs Abs: 1.8 10*3/uL (ref 0.7–4.0)
MCH: 29.9 pg (ref 26.0–34.0)
MCHC: 32.1 g/dL (ref 30.0–36.0)
MCV: 93.1 fL (ref 80.0–100.0)
Monocytes Absolute: 0.4 10*3/uL (ref 0.1–1.0)
Monocytes Relative: 5 %
Neutro Abs: 5.5 10*3/uL (ref 1.7–7.7)
Neutrophils Relative %: 70 %
Platelets: 340 10*3/uL (ref 150–400)
RBC: 3.31 MIL/uL — ABNORMAL LOW (ref 4.22–5.81)
RDW: 13.7 % (ref 11.5–15.5)
WBC: 7.8 10*3/uL (ref 4.0–10.5)
nRBC: 0 % (ref 0.0–0.2)

## 2019-07-31 LAB — COMPREHENSIVE METABOLIC PANEL
ALT: 17 U/L (ref 0–44)
AST: 24 U/L (ref 15–41)
Albumin: 2.4 g/dL — ABNORMAL LOW (ref 3.5–5.0)
Alkaline Phosphatase: 68 U/L (ref 38–126)
Anion gap: 9 (ref 5–15)
BUN: 25 mg/dL — ABNORMAL HIGH (ref 8–23)
CO2: 19 mmol/L — ABNORMAL LOW (ref 22–32)
Calcium: 8.5 mg/dL — ABNORMAL LOW (ref 8.9–10.3)
Chloride: 113 mmol/L — ABNORMAL HIGH (ref 98–111)
Creatinine, Ser: 3.48 mg/dL — ABNORMAL HIGH (ref 0.61–1.24)
GFR calc Af Amer: 20 mL/min — ABNORMAL LOW (ref >60)
GFR calc non Af Amer: 17 mL/min — ABNORMAL LOW (ref >60)
Glucose, Bld: 123 mg/dL — ABNORMAL HIGH (ref 70–99)
Potassium: 4.6 mmol/L (ref 3.5–5.1)
Sodium: 141 mmol/L (ref 135–145)
Total Bilirubin: 0.6 mg/dL (ref 0.3–1.2)
Total Protein: 5.4 g/dL — ABNORMAL LOW (ref 6.5–8.1)

## 2019-07-31 LAB — URINALYSIS, ROUTINE W REFLEX MICROSCOPIC
Bilirubin Urine: NEGATIVE
Glucose, UA: NEGATIVE mg/dL
Ketones, ur: NEGATIVE mg/dL
Leukocytes,Ua: NEGATIVE
Nitrite: NEGATIVE
Protein, ur: >=300 mg/dL — AB
Specific Gravity, Urine: 1.014 (ref 1.005–1.030)
pH: 5 (ref 5.0–8.0)

## 2019-07-31 LAB — CBG MONITORING, ED: Glucose-Capillary: 100 mg/dL — ABNORMAL HIGH (ref 70–99)

## 2019-07-31 LAB — RAPID URINE DRUG SCREEN, HOSP PERFORMED
Amphetamines: NOT DETECTED
Barbiturates: NOT DETECTED
Benzodiazepines: NOT DETECTED
Cocaine: NOT DETECTED
Opiates: NOT DETECTED
Tetrahydrocannabinol: NOT DETECTED

## 2019-07-31 LAB — VITAMIN B12: Vitamin B-12: 1100 pg/mL — ABNORMAL HIGH (ref 180–914)

## 2019-07-31 LAB — SARS CORONAVIRUS 2 (TAT 6-24 HRS): SARS Coronavirus 2: NEGATIVE

## 2019-07-31 LAB — GLUCOSE, CAPILLARY
Glucose-Capillary: 92 mg/dL (ref 70–99)
Glucose-Capillary: 130 mg/dL — ABNORMAL HIGH (ref 70–99)

## 2019-07-31 LAB — ETHANOL: Alcohol, Ethyl (B): <10 mg/dL (ref <10)

## 2019-07-31 MED ORDER — AMLODIPINE BESYLATE 10 MG PO TABS
10.0000 mg | ORAL_TABLET | Freq: Every day | ORAL | Status: DC
  Administered 2019-08-01 – 2019-08-02 (×2): 10 mg via ORAL
  Filled 2019-07-31 (×2): qty 1

## 2019-07-31 MED ORDER — VITAMIN B-12 1000 MCG PO TABS
1000.0000 ug | ORAL_TABLET | Freq: Every day | ORAL | Status: DC
  Administered 2019-08-01 – 2019-08-02 (×2): 1000 ug via ORAL
  Filled 2019-07-31 (×2): qty 1

## 2019-07-31 MED ORDER — HEPARIN SODIUM (PORCINE) 5000 UNIT/ML IJ SOLN
5000.0000 [IU] | Freq: Three times a day (TID) | INTRAMUSCULAR | Status: DC
  Administered 2019-07-31 – 2019-08-02 (×6): 5000 [IU] via SUBCUTANEOUS
  Filled 2019-07-31 (×6): qty 1

## 2019-07-31 MED ORDER — ATORVASTATIN CALCIUM 80 MG PO TABS
80.0000 mg | ORAL_TABLET | Freq: Every day | ORAL | Status: DC
  Administered 2019-08-01: 20:00:00 80 mg via ORAL
  Filled 2019-07-31: qty 1

## 2019-07-31 MED ORDER — DORZOLAMIDE HCL-TIMOLOL MAL 2-0.5 % OP SOLN
1.0000 [drp] | Freq: Two times a day (BID) | OPHTHALMIC | Status: DC
  Administered 2019-07-31 – 2019-08-02 (×5): 1 [drp] via OPHTHALMIC
  Filled 2019-07-31: qty 10

## 2019-07-31 MED ORDER — SODIUM CHLORIDE 0.9 % IV SOLN: INTRAVENOUS | Status: AC

## 2019-07-31 MED ORDER — SODIUM BICARBONATE 650 MG PO TABS
650.0000 mg | ORAL_TABLET | Freq: Two times a day (BID) | ORAL | Status: DC
  Administered 2019-07-31 – 2019-08-02 (×4): 650 mg via ORAL
  Filled 2019-07-31 (×6): qty 1

## 2019-07-31 MED ORDER — METOPROLOL TARTRATE 25 MG PO TABS
12.5000 mg | ORAL_TABLET | Freq: Two times a day (BID) | ORAL | Status: DC
  Administered 2019-08-01 – 2019-08-02 (×3): 12.5 mg via ORAL
  Filled 2019-07-31 (×3): qty 1

## 2019-07-31 MED ORDER — ADULT MULTIVITAMIN W/MINERALS CH
1.0000 | ORAL_TABLET | Freq: Every day | ORAL | Status: DC
  Administered 2019-08-01 – 2019-08-02 (×2): 1 via ORAL
  Filled 2019-07-31 (×2): qty 1

## 2019-07-31 MED ORDER — INSULIN ASPART 100 UNIT/ML ~~LOC~~ SOLN: 0.0000 [IU] | Freq: Three times a day (TID) | SUBCUTANEOUS | Status: DC

## 2019-07-31 MED ORDER — ASPIRIN 81 MG PO CHEW
81.0000 mg | CHEWABLE_TABLET | Freq: Every day | ORAL | Status: DC
  Administered 2019-08-01 – 2019-08-02 (×2): 81 mg via ORAL
  Filled 2019-07-31 (×2): qty 1

## 2019-07-31 NOTE — H&P (Addendum)
Date: 07/31/2019               Patient Name:  Dennis Macias MRN: [id number]  DOB: 1951/06/17 Age / Sex: 69 y.o., male   PCP: [name], MD         Medical Service: Internal Medicine Teaching Service         Attending Physician: Dr. Sherwood Gambler, MD    First Contact: Sheppard Coil, MD, [name] Pager: [place] [phone])  Second Contact: Eileen Stanford, MD, [place] Pager: [place] [phone])       After Hours (After 5p/  First Contact Pager: [phone]  weekends / holidays): Second Contact Pager: [phone]   Chief Complaint: Fall  History of Present Illness: Dennis Macias is a 69 y.o male with PMHx significant for HTN, HLD, ESRD, CVA, T2DM, and elevated PSA who presents with a fall. Patient states he was on his way to his nephrologist appointment and tripped over the curb and hit his head. No LOC. He states that he has been experiencing lower extremity weakness for a "a good while, maybe months." Sometimes he feels like his leg will give out and becomes heavy. He endorses numbness and tingling of his lower extremities. He denies urinary or bowel incontinence or saddle anesthesia. He states his lower extremity weakness is equal and no asymetric. He endorses >30 pound weight loss, decreased appetite, early satiety but denies fevers, night sweats. He also endorses intermittent chronic lower back pain he also endorses vision loss in his right eye due to progression of diabetes.  Of note, the patient was seen on 1/19 in the ED for back pain and bilateral hip weakness.  The patient was evaluated and ultimately discharged home with recommended follow-up with physical therapy.  At that time an x-ray of his lumbar spine did not demonstrate any lytic or blastic lesions.  In the ED, CBC notable for low hemoglobin to 9.9 (at baseline), CMP demonstrated elevated BUN and creatinine to 25 and 3.48 (at baseline) respectively. UA and UDS unremarkable.  CT of the head and cervical spine did not show any acute intracranial  processes or spinal cord pathology. Patient also had x-rays of his chest and bilateral forearms which did not show any acute fractures.  Meds: Current Meds  Medication Sig  . amLODipine (NORVASC) 10 MG tablet Take 1 tablet (10 mg total) by mouth daily.  Marland Kitchen aspirin 81 MG tablet Take 1 tablet (81 mg total) by mouth daily. Internal Medicine Program place on hold until patient requests to fill  . atorvastatin (LIPITOR) 80 MG tablet Take 1 tablet (80 mg total) by mouth at bedtime. IM program  . dorzolamide-timolol (COSOPT) 22.3-6.8 MG/ML ophthalmic solution Place 1 drop into the right eye 2 (two) times daily.   . metoprolol tartrate (LOPRESSOR) 25 MG tablet Take 0.5 tablets (12.5 mg total) by mouth 2 (two) times daily.  . sodium bicarbonate 650 MG tablet Take 650 mg by mouth 2 (two) times daily.  . vitamin B-12 (CYANOCOBALAMIN) 1000 MCG tablet Take 1,000 mcg by mouth daily.   Allergies: Allergies as of 07/31/2019  . (No Known Allergies)   Past Medical History:  Diagnosis Date  . Anemia   . Arthritis    past hx   . Blindness    right eye  . Cataract    removed both eyes  . Dehydration   . Diabetes ([place])   . Glaucoma   . History of CVA (cerebrovascular accident) 09/13/2015  . History of urinary retention   .  Hyperlipidemia   . Hypertension   . Pernicious anemia 02/24/2018  . Stroke [facility])    2017- March  . Tachycardia 08/26/2017  . Tubular adenoma of colon 02/2017  . Weight loss, non-intentional 08/26/2017   10 lbs between 6/18 & 2/19   Past Surgical History:  Procedure Laterality Date  . CATARACT EXTRACTION, BILATERAL    . COLONOSCOPY    . POLYPECTOMY    . REFRACTIVE SURGERY  10/2017  . TEE WITHOUT CARDIOVERSION N/A 09/14/2015   Procedure: TRANSESOPHAGEAL ECHOCARDIOGRAM (TEE);  Surgeon: Larey Dresser, MD;  Location: [facility] ENDOSCOPY;  Service: Cardiovascular;  Laterality: N/A;  . UPPER GASTROINTESTINAL ENDOSCOPY     Family History:  Family History  Problem Relation Age of Onset    . Hypertension Mother   . Hyperlipidemia Mother   . Hyperlipidemia Father   . Colon cancer Neg Hx   . Colon polyps Neg Hx   . Esophageal cancer Neg Hx   . Rectal cancer Neg Hx   . Stomach cancer Neg Hx     Social History:  Social History   Tobacco Use  . Smoking status: Former Research scientist (life sciences)  . Smokeless tobacco: Former Systems developer    Types: Chew    Quit date: 07/09/1978  . Tobacco comment: quit 1 year ago  Substance Use Topics  . Alcohol use: No    Alcohol/week: 1.0 standard drinks    Types: 1 Cans of beer per week    Comment: quit last march/2017  . Drug use: No  -Used to drink heavily in the past (3-24 packs). Stopped using alcohol 2-3 years ago -Quit smoking around the same time is drinking  Review of Systems: A complete ROS was negative except as per HPI.  Imaging: CT Head & C-spine: IMPRESSION: 1. Scalp contusion over the left frontal bone without underlying fracture or acute intracranial abnormality. 2. No acute abnormality cervical spine. 3. Small bilateral pleural effusions. 4. Cervical spondylosis.  CXR: IMPRESSION: No active disease.  DG R forearm IMPRESSION: No fracture  DG L forearm IMPRESION: No fracture  EKG: Sinus rhythm, no signs of acute ischemic changes  Physical Exam: Blood pressure (!) 153/72, pulse 63, temperature (!) 95.9 F (35.5 C), temperature source Rectal, resp. rate 16, SpO2 100 %.  Physical Exam Vitals reviewed.  Constitutional:      General: He is not in acute distress.    Appearance: Normal appearance. He is not ill-appearing or toxic-appearing.  HENT:     Head: Normocephalic.     Comments: Abrasion and hematoma present on the L forehead  Eyes:     General: No scleral icterus.       Right eye: No discharge.        Left eye: No discharge.     Extraocular Movements: Extraocular movements intact.  Cardiovascular:     Rate and Rhythm: Normal rate and regular rhythm.     Pulses: Normal pulses.     Heart sounds: Normal heart sounds.  No murmur. No friction rub. No gallop.   Pulmonary:     Effort: Pulmonary effort is normal. No respiratory distress.     Breath sounds: Normal breath sounds. No wheezing or rales.  Abdominal:     General: There is no distension.     Palpations: Abdomen is soft.     Tenderness: There is no abdominal tenderness. There is no guarding.  Musculoskeletal:        General: Signs of injury (Abrasions on bilateral finger nailbeds ) present. No swelling, tenderness  or deformity. Normal range of motion.     Right lower leg: Edema (2+ pitting edema up to the knee) present.     Left lower leg: Edema (2+ pitting edema up to the knee) present.  Skin:    General: Skin is warm and dry.     Coloration: Skin is not jaundiced.     Findings: No bruising.     Comments: Dried, flakey skin on bilateral forearms and lower legs  Psychiatric:        Mood and Affect: Mood normal.   Neurological: Mental Status: Patient is awake, alert, oriented x3 No signs of aphasia or neglect Cranial Nerves: II: R Pupil dilated to 28mm and unresponsive to light. L pupil 67mm and reactive to light  III,IV, VI: EOMI without ptosis or diploplia.  V: Facial sensation is symmetric to light touch VII: Facial movement is symmetric VIII: hearing is intact to voice X: Uvula elevates symmetrically XI: Shoulder shrug is symmetric XII: Tongue is midline without atrophy or fasciculations Motor: Symmetric effort thorughout, at Least 5/5 bilateral UE, 4/5 bilateral lower extremitiy  Sensory: Sensation is grossly intact in bilateral UEs & LEs Deep Tendon Reflexes: Difficult to assess due to patient flexing while attempting to test Plantars: Toes are downgoing bilaterally. 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  This is in the context of several months of poor appetite and 30 pound weight loss. The patient's history of evaded PSA, weight loss and more frequent falls is concerning for a malignancy compressing the spinal cord, however the patient's neurologic exam is nonfocal and the patient does not exhibit any urinary or bowel incontinence.  #Mechanical fall: Patient states he tripped over the sidewalk ledge on his way to his nephrology appointment this morning.  Did not lose consciousness.  CT head and cervical spine unremarkable.  Neurologic exam is reassuring, and I do not believe additional imaging is indicated at this time. -Vitamin B12 levels ordered -PT consult ordered -Telemetry -Daily CBC  #Elevated PSA: PSA elevated to 4.7 in March 2019 then subsequently 4.34 in June 2019.  Patient was supposed to continue following up with Urology to evaluate the need for prostate biopsy, however it is unclear if the patient ever had this done. Patient had a CT of the abdomen and pelvis in 2019 which did not demonstrate any blastic or lytic lesions. -Will recommend f/u with Urology in the outpatient setting.  #CKD stage IV: Patient's creatinine is elevated to 3.48, which is at his baseline. -Monitor with daily BMPs  #Hx CVA #HTN #HLD -Amlodipine 10 mg daily -Metoprolol 12.5 mg twice daily -Aspirin 81 mg daily -Lipitor 80 mg daily  #Hx T2DM: Patient has had diabetes in the past, however A1c 1 month ago was 5.3.  Does not take any diabetes medications at home. -SSI  #Hx BL cataracts s/p extraction #Hx Glaucoma  #Hx R eye vision loss -Cosopt ophthalmic solution 1 drop both eyes twice  daily  #FEN/GI -Diet: Heart healthy, carb modified/  1200 cc fluid restrictions -Fluids: None  #DVT prophylaxis -Heparin 5000 units subq injections q8hrs  #CODE STATUS: FULL  #Dispo: Admit patient to Observation with expected length of stay less than 2 midnights.  Prior to Admission Living Arrangement: Home Anticipated Discharge Location: Home Barriers to Discharge: Pending medical work-up  Signed: Earlene Plater, MD Internal Medicine, PGY1 Pager: [phone]  07/31/2019,12:23 PM

## 2019-07-31 NOTE — ED Provider Notes (Addendum)
[facility] EMERGENCY DEPARTMENT Provider Note   CSN: [id number] Arrival date & time: 07/31/19  8416     History Chief Complaint  Patient presents with  . Fall    Dennis Macias is a 69 y.o. male.  HPI 69 year old male presents with trip and fall.  He reports he was going to see the kidney doctor and tripped and fell forward.  Hit his head.  He thinks he might of lost consciousness though bystanders said he did not.  He never felt presyncopal symptoms.  Denies current headache, neck pain, chest pain.  He states he scraped his knees but currently they are not hurting.  He is complaining of some bilateral forearm pain since the fall.  No wrist or hand pain.  Denies acute illness.   Past Medical History:  Diagnosis Date  . Anemia   . Arthritis    past hx   . Blindness    right eye  . Cataract    removed both eyes  . Dehydration   . Diabetes ([place])   . Glaucoma   . History of CVA (cerebrovascular accident) 09/13/2015  . History of urinary retention   . Hyperlipidemia   . Hypertension   . Pernicious anemia 02/24/2018  . Stroke [facility])    2017- March  . Tachycardia 08/26/2017  . Tubular adenoma of colon 02/2017  . Weight loss, non-intentional 08/26/2017   10 lbs between 6/18 & 2/19    Patient Active Problem List   Diagnosis Date Noted  . Accident due to mechanical fall without injury 07/31/2019  . Renal lesion 07/21/2019  . CKD (chronic kidney disease) stage 4, GFR 15-29 ml/min (HCC) 04/23/2019  . Hip weakness 08/13/2018  . Pernicious anemia 02/24/2018  . Elevated PSA 10/22/2017  . Weight loss, non-intentional 08/26/2017  . 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 BPH with obstruction/lower urinary tract symptoms   . Normochromic anemia 12/21/2016  . 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 Hyperlipidemia 09/13/2015  . Diabetes mellitus with retinopathy of both eyes ([place])   . Essential hypertension     Past Surgical History:  Procedure  Laterality Date  . CATARACT EXTRACTION, BILATERAL    . COLONOSCOPY    . POLYPECTOMY    . REFRACTIVE SURGERY  10/2017  . TEE WITHOUT CARDIOVERSION N/A 09/14/2015   Procedure: TRANSESOPHAGEAL ECHOCARDIOGRAM (TEE);  Surgeon: Larey Dresser, MD;  Location: [place];  Service: Cardiovascular;  Laterality: N/A;  . UPPER GASTROINTESTINAL ENDOSCOPY         Family History  Problem Relation Age of Onset  . Hypertension Mother   . Hyperlipidemia Mother   . Hyperlipidemia Father   . Colon cancer Neg Hx   . Colon polyps Neg Hx   . Esophageal cancer Neg Hx   . Rectal cancer Neg Hx   . Stomach cancer Neg Hx     Social History   Tobacco Use  . Smoking status: Former Research scientist (life sciences)  . Smokeless tobacco: Former Systems developer    Types: Chew    Quit date: 07/09/1978  . Tobacco comment: quit 1 year ago  Substance Use Topics  . Alcohol use: No    Alcohol/week: 1.0 standard drinks    Types: 1 Cans of beer per week    Comment: quit last march/2017  . Drug use: No    Home Medications Prior to Admission medications   Medication Sig Start Date End Date Taking? Authorizing Provider  amLODipine (NORVASC) 10 MG tablet Take 1 tablet (10  mg total) by mouth daily. 07/22/19  Yes [name], Rylee, MD  aspirin 81 MG tablet Take 1 tablet (81 mg total) by mouth daily. Internal Medicine Program place on hold until patient requests to fill 01/27/16  Yes Jule Ser, DO  atorvastatin (LIPITOR) 80 MG tablet Take 1 tablet (80 mg total) by mouth at bedtime. IM program 07/22/19  Yes [name], Rylee, MD  dorzolamide-timolol (COSOPT) 22.3-6.8 MG/ML ophthalmic solution Place 1 drop into the right eye 2 (two) times daily.  01/16/18  Yes [provider]  metoprolol tartrate (LOPRESSOR) 25 MG tablet Take 0.5 tablets (12.5 mg total) by mouth 2 (two) times daily. 07/15/19 08/14/19 Yes [name], Rylee, MD  sodium bicarbonate 650 MG tablet Take 650 mg by mouth 2 (two) times daily.   Yes [provider]  vitamin B-12  (CYANOCOBALAMIN) 1000 MCG tablet Take 1,000 mcg by mouth daily.   Yes [provider]  ACCU-CHEK FASTCLIX LANCETS MISC Check blood sugar up to 7 times a week as instructed 08/13/18   Isabelle Course, MD  Blood Glucose Monitoring Suppl (ACCU-CHEK GUIDE) w/Device KIT 1 each by Does not apply route daily. Check blood sugar as instructed up to 7 times a week 08/13/18   Isabelle Course, MD  glucose blood (ACCU-CHEK GUIDE) test strip Check blood sugar up to 7 times a week as instructed 08/13/18   Isabelle Course, MD    Allergies    Patient has no known allergies.  Review of Systems   Review of Systems  Constitutional: Negative for fever.  Respiratory: Negative for shortness of breath.   Cardiovascular: Negative for chest pain.  Gastrointestinal: Negative for abdominal pain.  Musculoskeletal: Positive for arthralgias. Negative for neck pain.  Neurological: Negative for headaches.  All other systems reviewed and are negative.   Physical Exam Updated Vital Signs BP 138/74   Pulse 67   Temp (!) 95.9 F (35.5 C) (Rectal)   Resp 16   SpO2 100%   Physical Exam Vitals and nursing note reviewed.  Constitutional:      Appearance: He is well-developed.     Interventions: Cervical collar in place.  HENT:     Head: Normocephalic. Contusion present.      Right Ear: External ear normal.     Left Ear: External ear normal.     Nose: Nose normal.  Eyes:     General:        Right eye: No discharge.        Left eye: No discharge.     Comments: Right pupil is dilated compared to left  Cardiovascular:     Rate and Rhythm: Normal rate and regular rhythm.     Heart sounds: Normal heart sounds.  Pulmonary:     Effort: Pulmonary effort is normal.     Breath sounds: Normal breath sounds.  Abdominal:     Palpations: Abdomen is soft.     Tenderness: There is no abdominal tenderness.  Musculoskeletal:     Right forearm: Tenderness present. No swelling or deformity.     Left forearm: Tenderness  present. No swelling or deformity.     Right wrist: No swelling, deformity or tenderness. Normal range of motion.     Left wrist: No swelling, deformity or tenderness. Normal range of motion.     Right hand: No tenderness.     Left hand: No tenderness.     Cervical back: Neck supple. No tenderness.     Thoracic back: No tenderness.  Lumbar back: No tenderness.     Right hip: Normal range of motion.     Left hip: Normal range of motion.     Right knee: Normal range of motion. No tenderness.     Left knee: Normal range of motion. No tenderness.       Legs:     Comments: Small abrasions to fingers bilaterally  Skin:    General: Skin is warm and dry.  Neurological:     Mental Status: He is alert.     Comments: Patient is awake, alert, oriented to person, place, month. Disoriented to year (says 2001 and 2000). When asked president he knows a transition happened but can't name either person.  CN 3-12 grossly intact. 5/5 strength in all 4 extremities. Grossly normal sensation.  Psychiatric:        Mood and Affect: Mood is not anxious.     ED Results / Procedures / Treatments   Labs (all labs ordered are listed, but only abnormal results are displayed) Labs Reviewed  COMPREHENSIVE METABOLIC PANEL - Abnormal; Notable for the following components:      Result Value   Chloride 113 (*)    CO2 19 (*)    Glucose, Bld 123 (*)    BUN 25 (*)    Creatinine, Ser 3.48 (*)    Calcium 8.5 (*)    Total Protein 5.4 (*)    Albumin 2.4 (*)    GFR calc non Af Amer 17 (*)    GFR calc Af Amer 20 (*)    All other components within normal limits  CBC WITH DIFFERENTIAL/PLATELET - Abnormal; Notable for the following components:   RBC 3.31 (*)    Hemoglobin 9.9 (*)    HCT 30.8 (*)    All other components within normal limits  URINALYSIS, ROUTINE W REFLEX MICROSCOPIC - Abnormal; Notable for the following components:   APPearance HAZY (*)    Hgb urine dipstick SMALL (*)    Protein, ur >=300 (*)     Bacteria, UA RARE (*)    All other components within normal limits  CBG MONITORING, ED - Abnormal; Notable for the following components:   Glucose-Capillary 100 (*)    All other components within normal limits  SARS CORONAVIRUS 2 (TAT 6-24 HRS)  RAPID URINE DRUG SCREEN, HOSP PERFORMED  ETHANOL  VITAMIN B12  CBC  CREATININE, SERUM    EKG None  Radiology DG Chest 1 View  Result Date: 07/31/2019 CLINICAL DATA:  Fall. EXAM: CHEST  1 VIEW COMPARISON:  August 13, 2018. FINDINGS: The heart size and mediastinal contours are within normal limits. Both lungs are clear. No pneumothorax or pleural effusion is noted. The visualized skeletal structures are unremarkable. IMPRESSION: No active disease. Electronically Signed   By: Marijo Conception M.D.   On: 07/31/2019 10:04   DG Forearm Left  Result Date: 07/31/2019 CLINICAL DATA:  Fall EXAM: LEFT FOREARM - 2 VIEW COMPARISON:  None. FINDINGS: Alignment is anatomic. No acute fracture. Diffuse vascular calcification. Degenerative changes at the elbow. IMPRESSION: No acute fracture. Electronically Signed   By: Macy Mis M.D.   On: 07/31/2019 09:58   DG Forearm Right  Result Date: 07/31/2019 CLINICAL DATA:  Right forearm pain after fall. EXAM: RIGHT FOREARM - 2 VIEW COMPARISON:  None. FINDINGS: There is no evidence of fracture or other focal bone lesions. Vascular calcifications are noted. IMPRESSION: No acute abnormality seen in the right forearm. Electronically Signed   By: Marijo Conception  M.D.   On: 07/31/2019 10:02   CT Head Wo Contrast  Result Date: 07/31/2019 CLINICAL DATA:  Melinda Crutch to the head today when the patient tripped over a curb and fell. Initial encounter. EXAM: CT HEAD WITHOUT CONTRAST CT CERVICAL SPINE WITHOUT CONTRAST TECHNIQUE: Multidetector CT imaging of the head and cervical spine was performed following the standard protocol without intravenous contrast. Multiplanar CT image reconstructions of the cervical spine were also  generated. COMPARISON:  Head CT scan 07/27/2019. FINDINGS: CT HEAD FINDINGS Brain: No evidence of acute infarction, hemorrhage, hydrocephalus, extra-axial collection or mass lesion/mass effect. Extensive chronic microvascular ischemic change again seen. Vascular: No hyperdense vessel or unexpected calcification. Skull: Intact. No focal lesion. Sinuses/Orbits: Negative. Other: There appears to be a scalp contusion over the left frontal bone. CT CERVICAL SPINE FINDINGS Alignment: Maintained with straightening of lordosis noted. Skull base and vertebrae: No acute fracture. No primary bone lesion or focal pathologic process. 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 Soft tissues and spinal canal: No prevertebral fluid or swelling. No visible canal hematoma. Disc levels: Loss of disc space height and a broad-based central protrusion are seen at C3-4. Upper chest: Small layering pleural effusions are noted. Imaged lung parenchyma is clear. Other: None. IMPRESSION: 1. Scalp contusion over the left frontal bone without underlying fracture or acute intracranial abnormality. 2. No acute abnormality cervical spine. 3. Small bilateral pleural effusions. 4. Cervical spondylosis. Electronically Signed   By: Inge Rise M.D.   On: 07/31/2019 10:40   CT Cervical Spine Wo Contrast  Result Date: 07/31/2019 CLINICAL DATA:  Melinda Crutch to the head today when the patient tripped over a curb and fell. Initial encounter. EXAM: CT HEAD WITHOUT CONTRAST CT CERVICAL SPINE WITHOUT CONTRAST TECHNIQUE: Multidetector CT imaging of the head and cervical spine was performed following the standard protocol without intravenous contrast. Multiplanar CT image reconstructions of the cervical spine were also generated. COMPARISON:  Head CT scan 07/27/2019. FINDINGS: CT HEAD FINDINGS Brain: No evidence of acute infarction, hemorrhage, hydrocephalus, extra-axial collection or mass lesion/mass effect. Extensive chronic  microvascular ischemic change again seen. Vascular: No hyperdense vessel or unexpected calcification. Skull: Intact. No focal lesion. Sinuses/Orbits: Negative. Other: There appears to be a scalp contusion over the left frontal bone. CT CERVICAL SPINE FINDINGS Alignment: Maintained with straightening of lordosis noted. Skull base and vertebrae: No acute fracture. No primary bone lesion or focal pathologic process. 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 Soft tissues and spinal canal: No prevertebral fluid or swelling. No visible canal hematoma. Disc levels: Loss of disc space height and a broad-based central protrusion are seen at C3-4. Upper chest: Small layering pleural effusions are noted. Imaged lung parenchyma is clear. Other: None. IMPRESSION: 1. Scalp contusion over the left frontal bone without underlying fracture or acute intracranial abnormality. 2. No acute abnormality cervical spine. 3. Small bilateral pleural effusions. 4. Cervical spondylosis. Electronically Signed   By: Inge Rise M.D.   On: 07/31/2019 10:40    Procedures Procedures (including critical care time)  Medications Ordered in ED Medications  aspirin tablet 81 mg (has no administration in time range)  amLODipine (NORVASC) tablet 10 mg (has no administration in time range)  atorvastatin (LIPITOR) tablet 80 mg (has no administration in time range)  metoprolol tartrate (LOPRESSOR) tablet 12.5 mg (has no administration in time range)  sodium bicarbonate tablet 650 mg (has no administration in time range)  vitamin B-12 (CYANOCOBALAMIN) tablet 1,000 mcg (has  no administration in time range)  dorzolamide-timolol (COSOPT) 22.3-6.8 MG/ML ophthalmic solution 1 drop (has no administration in time range)  heparin injection 5,000 Units (has no administration in time range)  multivitamin with minerals tablet 1 tablet (has no administration in time range)    ED Course  I have reviewed the triage  vital signs and the nursing notes.  Pertinent labs & imaging results that were available during my care of the patient were reviewed by me and considered in my medical decision making (see chart for details).    MDM Rules/Calculators/A&P                      Patient appears to have minor injuries from this fall.  Continues to be disoriented to the year.  Discussed with the spouse and this is not normal for him.  Discussed with his PCP who also advises not normal.  He is also been having hip flexor weakness.  Thus discussed with the internal medicine teaching service to admit.  They request T and L-spine MRI and I think with his altered mental state I will also do MRI brain.  Admit.  Dennis Macias was evaluated in Emergency Department on 07/31/2019 for the symptoms described in the history of present illness. He was evaluated in the context of the global COVID-19 pandemic, which necessitated consideration that the patient might be at risk for infection with the SARS-CoV-2 virus that causes COVID-19. Institutional protocols and algorithms that pertain to the evaluation of patients at risk for COVID-19 are in a state of rapid change based on information released by regulatory bodies including the CDC and federal and state organizations. These policies and algorithms were followed during the patient's care in the ED.   Of note, his right pupil is larger than left, probably chronic Final Clinical Impression(s) / ED Diagnoses Final diagnoses:  Altered mental status, unspecified altered mental status type    Rx / DC Orders ED Discharge Orders    None       Sherwood Gambler, MD 07/31/19 1325    Sherwood Gambler, MD 07/31/19 1400

## 2019-07-31 NOTE — Progress Notes (Signed)
Patient suffers from generalized weakness and ESRD which impairs their ability to perform daily activities like mobilizing and performing ADL's in the home.  A walker alone will not resolve the issues with performing activities of daily living. A wheelchair will allow patient to safely perform daily activities.  The patient can self propel in the home or has a caregiver who can provide assistance.     Ellamae Sia, PT, DPT Acute Rehabilitation Services Pager [phone] Office [phone]

## 2019-07-31 NOTE — ED Notes (Signed)
Please call daughter for any updates and give a call as soon as you can her name is tiago humphrey [phone]

## 2019-07-31 NOTE — Telephone Encounter (Signed)
1/21 evening: Spoke with Arianna's daughter on the phone on 1/21 regarding her concerns with his progressive weakness. They have made several modifications at his home due to this however the weakness seems to be worsening. She notes that he has had several falls or near falls since I saw him in the clinic which is also concerning. One of these events led to an ED visit on 1/18 at which time imaging and labs did not necessitate admission and he was discharged. She notes continued poor appetite as well. She inquired about obtaining a wheelchair and/or hospital bed. We discussed on he would need a wheelchair evaluation to be done and I placed a referral to neuro rehab in addition to the PT referral I had placed at last visit. She notes that since last visit, he has developed the inability to get in or out of the car on his own due to not being able to lift his feet. He has also been unable to get out of bed or off the toilet. We discussed further plans of management including coming back to the clinic or ED should he fall again. Overall, his progressive weakness, weight loss, and decline in renal function over the past few months is fairly concerning to me, especially in the setting of the renal lesion found on [place] on 1/5.  UPDATE 1/22: Patient had mechanical fall while attempting to get to his nephrology appointment today. He was brought to the [facility] by EMS. Basic imaging and head/c-spine CT unremarkable for acute findings. No significant changes in labs since he was in the ER for a fall a few days ago. Daughter requested an update this morning at which time I relayed these things to her. I also discussed patient with ED provider and on call admitting team at which time it was decided he would be admitted for further evaluation including MRI of his T and L-spine and head. I relayed this to patient's daughter [name] and encouraged her to reach out to his hospital team or myself for further updates.  [name], MD

## 2019-07-31 NOTE — Evaluation (Signed)
Physical Therapy Evaluation Patient Details Name: Dennis Macias MRN: [id number] DOB: 1951-05-25 Today's Date: 07/31/2019   History of Present Illness  69 y.o male with PMHx significant for HTN, HLD, ESRD, CVA, T2DM, and elevated PSA who presents with a fall. Patient states he was on his way to his nephrologist appointment and tripped over the curb and hit his head. No LOC. Pt reports he has been experiencing progressive lower extremity weakness.   Clinical Impression  Prior to admission, pt lives with his girlfriend in an apartment with stairs to enter. Recently, pt and pt daughter reporting increasing difficulty with rising from surfaces I.e. out of bed, off of toilet. On PT evaluation, pt presents with decreased functional mobility secondary to proximal weakness, balance impairments, and decreased activity tolerance. Ambulating 150 feet with a walker and min guard assist. Recommended use of walker for all mobility. Pt and pt daughter also asking about a wheelchair and I think this is appropriate for longer distances and navigating into/out of dialysis sessions. Will benefit from HHPT upon discharge.     Follow Up Recommendations Home health PT;Supervision for mobility/OOB    Equipment Recommendations  3in1 (PT);Wheelchair (measurements PT);Wheelchair cushion (measurements PT)    Recommendations for Other Services       Precautions / Restrictions Precautions Precautions: Fall Restrictions Weight Bearing Restrictions: No      Mobility  Bed Mobility Overal bed mobility: Modified Independent             General bed mobility comments: Increased time/effort to rise  Transfers Overall transfer level: Needs assistance Equipment used: Rolling walker (2 wheeled) Transfers: Sit to/from Stand Sit to Stand: Min guard;From elevated surface         General transfer comment: Min guard to rise from elevated bed surface  Ambulation/Gait Ambulation/Gait assistance: Min guard Gait  Distance (Feet): 150 Feet Assistive device: Rolling walker (2 wheeled) Gait Pattern/deviations: Step-through pattern;Decreased stride length;Trunk flexed Gait velocity: decreased   General Gait Details: Increased bilateral knee flexion in midstance but no buckle. Slow and steady pace, forward head posture.  Stairs            Wheelchair Mobility    Modified Rankin (Stroke Patients Only)       Balance Overall balance assessment: Needs assistance Sitting-balance support: Feet supported Sitting balance-Leahy Scale: Good     Standing balance support: Bilateral upper extremity supported Standing balance-Leahy Scale: Poor Standing balance comment: moderate reliance through arms on walker                             Pertinent Vitals/Pain Pain Assessment: No/denies pain    Home Living Family/patient expects to be discharged to:: Private residence Living Arrangements: Spouse/significant other(girlfriend) Available Help at Discharge: Other (Comment);Available PRN/intermittently;Family(girlfriend, daughter) Type of Home: Apartment Home Access: Stairs to enter   Entrance Stairs-Number of Steps: 2 Home Layout: Two level Home Equipment: Cane - single point;Toilet riser;Walker - 2 wheels      Prior Function Level of Independence: Needs assistance   Gait / Transfers Assistance Needed: Recently has had difficulty rising from toilet, bed, low surfaces  ADL's / Homemaking Assistance Needed: uses a cane        Hand Dominance        Extremity/Trunk Assessment   Upper Extremity Assessment Upper Extremity Assessment: RUE deficits/detail;LUE deficits/detail RUE Deficits / Details: Strength 4-/5 LUE Deficits / Details: Strength 4-/5    Lower Extremity Assessment Lower Extremity Assessment: RLE deficits/detail;LLE  deficits/detail RLE Deficits / Details: Hip flexion 3+/5, knee extension 5/5, ankle dorsiflexion 5/5 LLE Deficits / Details: Hip flexion 3+/5, knee  extension 5/5, ankle dorsiflexion 5/5       Communication   Communication: No difficulties  Cognition Arousal/Alertness: Awake/alert Behavior During Therapy: WFL for tasks assessed/performed Overall Cognitive Status: Difficult to assess                                        General Comments      Exercises     Assessment/Plan    PT Assessment Patient needs continued PT services  PT Problem List Decreased strength;Decreased activity tolerance;Decreased balance;Decreased mobility       PT Treatment Interventions DME instruction;Functional mobility training;Gait training;Stair training;Therapeutic activities;Therapeutic exercise;Balance training;Patient/family education    PT Goals (Current goals can be found in the Care Plan section)  Acute Rehab PT Goals Patient Stated Goal: "get stronger." PT Goal Formulation: With patient/family Time For Goal Achievement: 08/14/19 Potential to Achieve Goals: Good    Frequency Min 3X/week   Barriers to discharge        Co-evaluation               AM-PAC PT "6 Clicks" Mobility  Outcome Measure Help needed turning from your back to your side while in a flat bed without using bedrails?: None Help needed moving from lying on your back to sitting on the side of a flat bed without using bedrails?: None Help needed moving to and from a bed to a chair (including a wheelchair)?: A Little Help needed standing up from a chair using your arms (e.g., wheelchair or bedside chair)?: A Little Help needed to walk in hospital room?: A Little Help needed climbing 3-5 steps with a railing? : A Lot 6 Click Score: 19    End of Session Equipment Utilized During Treatment: Gait belt Activity Tolerance: Patient tolerated treatment well Patient left: in bed;with call bell/phone within reach;with bed alarm set Nurse Communication: Mobility status PT Visit Diagnosis: Unsteadiness on feet (R26.81);Muscle weakness (generalized)  (M62.81);Difficulty in walking, not elsewhere classified (R26.2)    Time: 1561-5379 PT Time Calculation (min) (ACUTE ONLY): 32 min   Charges:   PT Evaluation $PT Eval Moderate Complexity: 1 Mod PT Treatments $Therapeutic Activity: 8-22 mins        Ellamae Sia, PT, DPT Acute Rehabilitation Services Pager [phone] Office [phone]   Willy Eddy 07/31/2019, 4:47 PM

## 2019-07-31 NOTE — ED Triage Notes (Signed)
Pt bib ptar after mechanical fall. Pt was on his way to dialysis when he tripped over a curb and hit his head. Hematoma to forehead. Small abrasions to fingers on bil hands. No LOC, no blood thinners. ccollar in place. VSS with PTAR.

## 2019-08-01 DIAGNOSIS — E785 Hyperlipidemia, unspecified: Secondary | ICD-10-CM

## 2019-08-01 DIAGNOSIS — R339 Retention of urine, unspecified: Secondary | ICD-10-CM | POA: Diagnosis present

## 2019-08-01 DIAGNOSIS — I129 Hypertensive chronic kidney disease with stage 1 through stage 4 chronic kidney disease, or unspecified chronic kidney disease: Secondary | ICD-10-CM | POA: Diagnosis not present

## 2019-08-01 DIAGNOSIS — I120 Hypertensive chronic kidney disease with stage 5 chronic kidney disease or end stage renal disease: Secondary | ICD-10-CM

## 2019-08-01 DIAGNOSIS — Z9841 Cataract extraction status, right eye: Secondary | ICD-10-CM

## 2019-08-01 DIAGNOSIS — Z79899 Other long term (current) drug therapy: Secondary | ICD-10-CM | POA: Diagnosis not present

## 2019-08-01 DIAGNOSIS — Z87891 Personal history of nicotine dependence: Secondary | ICD-10-CM

## 2019-08-01 DIAGNOSIS — R531 Weakness: Secondary | ICD-10-CM | POA: Diagnosis not present

## 2019-08-01 DIAGNOSIS — R4182 Altered mental status, unspecified: Secondary | ICD-10-CM | POA: Diagnosis present

## 2019-08-01 DIAGNOSIS — H5461 Unqualified visual loss, right eye, normal vision left eye: Secondary | ICD-10-CM | POA: Diagnosis not present

## 2019-08-01 DIAGNOSIS — E441 Mild protein-calorie malnutrition: Secondary | ICD-10-CM

## 2019-08-01 DIAGNOSIS — Z8249 Family history of ischemic heart disease and other diseases of the circulatory system: Secondary | ICD-10-CM | POA: Diagnosis not present

## 2019-08-01 DIAGNOSIS — E11319 Type 2 diabetes mellitus with unspecified diabetic retinopathy without macular edema: Secondary | ICD-10-CM | POA: Diagnosis not present

## 2019-08-01 DIAGNOSIS — Y92480 Sidewalk as the place of occurrence of the external cause: Secondary | ICD-10-CM | POA: Diagnosis not present

## 2019-08-01 DIAGNOSIS — E1122 Type 2 diabetes mellitus with diabetic chronic kidney disease: Secondary | ICD-10-CM

## 2019-08-01 DIAGNOSIS — N184 Chronic kidney disease, stage 4 (severe): Secondary | ICD-10-CM | POA: Diagnosis not present

## 2019-08-01 DIAGNOSIS — N2889 Other specified disorders of kidney and ureter: Secondary | ICD-10-CM | POA: Diagnosis not present

## 2019-08-01 DIAGNOSIS — Z8349 Family history of other endocrine, nutritional and metabolic diseases: Secondary | ICD-10-CM | POA: Diagnosis not present

## 2019-08-01 DIAGNOSIS — R972 Elevated prostate specific antigen [PSA]: Secondary | ICD-10-CM | POA: Diagnosis not present

## 2019-08-01 DIAGNOSIS — E1139 Type 2 diabetes mellitus with other diabetic ophthalmic complication: Secondary | ICD-10-CM | POA: Diagnosis not present

## 2019-08-01 DIAGNOSIS — H409 Unspecified glaucoma: Secondary | ICD-10-CM | POA: Diagnosis not present

## 2019-08-01 DIAGNOSIS — Z7982 Long term (current) use of aspirin: Secondary | ICD-10-CM | POA: Diagnosis not present

## 2019-08-01 DIAGNOSIS — Z20822 Contact with and (suspected) exposure to covid-19: Secondary | ICD-10-CM | POA: Diagnosis not present

## 2019-08-01 DIAGNOSIS — R319 Hematuria, unspecified: Secondary | ICD-10-CM | POA: Diagnosis not present

## 2019-08-01 DIAGNOSIS — R634 Abnormal weight loss: Secondary | ICD-10-CM

## 2019-08-01 DIAGNOSIS — N429 Disorder of prostate, unspecified: Secondary | ICD-10-CM | POA: Diagnosis not present

## 2019-08-01 DIAGNOSIS — W010XXA Fall on same level from slipping, tripping and stumbling without subsequent striking against object, initial encounter: Secondary | ICD-10-CM | POA: Diagnosis not present

## 2019-08-01 DIAGNOSIS — H269 Unspecified cataract: Secondary | ICD-10-CM | POA: Diagnosis not present

## 2019-08-01 DIAGNOSIS — S0003XA Contusion of scalp, initial encounter: Secondary | ICD-10-CM | POA: Diagnosis not present

## 2019-08-01 DIAGNOSIS — H42 Glaucoma in diseases classified elsewhere: Secondary | ICD-10-CM

## 2019-08-01 DIAGNOSIS — N186 End stage renal disease: Secondary | ICD-10-CM

## 2019-08-01 DIAGNOSIS — Z8673 Personal history of transient ischemic attack (TIA), and cerebral infarction without residual deficits: Secondary | ICD-10-CM | POA: Diagnosis not present

## 2019-08-01 DIAGNOSIS — G8929 Other chronic pain: Secondary | ICD-10-CM | POA: Diagnosis not present

## 2019-08-01 DIAGNOSIS — D649 Anemia, unspecified: Secondary | ICD-10-CM | POA: Diagnosis not present

## 2019-08-01 DIAGNOSIS — Z9842 Cataract extraction status, left eye: Secondary | ICD-10-CM

## 2019-08-01 LAB — CBC
HCT: 24.4 % — ABNORMAL LOW (ref 39.0–52.0)
Hemoglobin: 8.1 g/dL — ABNORMAL LOW (ref 13.0–17.0)
MCH: 29.7 pg (ref 26.0–34.0)
MCHC: 33.2 g/dL (ref 30.0–36.0)
MCV: 89.4 fL (ref 80.0–100.0)
Platelets: 291 10*3/uL (ref 150–400)
RBC: 2.73 MIL/uL — ABNORMAL LOW (ref 4.22–5.81)
RDW: 13.6 % (ref 11.5–15.5)
WBC: 6.1 10*3/uL (ref 4.0–10.5)
nRBC: 0 % (ref 0.0–0.2)

## 2019-08-01 LAB — URINALYSIS, ROUTINE W REFLEX MICROSCOPIC
Bacteria, UA: NONE SEEN
Bilirubin Urine: NEGATIVE
Glucose, UA: NEGATIVE mg/dL
Ketones, ur: NEGATIVE mg/dL
Leukocytes,Ua: NEGATIVE
Nitrite: NEGATIVE
Protein, ur: 100 mg/dL — AB
Specific Gravity, Urine: 1.014 (ref 1.005–1.030)
pH: 5 (ref 5.0–8.0)

## 2019-08-01 LAB — COMPREHENSIVE METABOLIC PANEL
ALT: 15 U/L (ref 0–44)
AST: 19 U/L (ref 15–41)
Albumin: 1.9 g/dL — ABNORMAL LOW (ref 3.5–5.0)
Alkaline Phosphatase: 63 U/L (ref 38–126)
Anion gap: 8 (ref 5–15)
BUN: 25 mg/dL — ABNORMAL HIGH (ref 8–23)
CO2: 19 mmol/L — ABNORMAL LOW (ref 22–32)
Calcium: 8.1 mg/dL — ABNORMAL LOW (ref 8.9–10.3)
Chloride: 115 mmol/L — ABNORMAL HIGH (ref 98–111)
Creatinine, Ser: 3.34 mg/dL — ABNORMAL HIGH (ref 0.61–1.24)
GFR calc Af Amer: 21 mL/min — ABNORMAL LOW (ref >60)
GFR calc non Af Amer: 18 mL/min — ABNORMAL LOW (ref >60)
Glucose, Bld: 103 mg/dL — ABNORMAL HIGH (ref 70–99)
Potassium: 4.2 mmol/L (ref 3.5–5.1)
Sodium: 142 mmol/L (ref 135–145)
Total Bilirubin: <0.1 mg/dL — ABNORMAL LOW (ref 0.3–1.2)
Total Protein: 4.4 g/dL — ABNORMAL LOW (ref 6.5–8.1)

## 2019-08-01 LAB — GLUCOSE, CAPILLARY
Glucose-Capillary: 78 mg/dL (ref 70–99)
Glucose-Capillary: 81 mg/dL (ref 70–99)
Glucose-Capillary: 102 mg/dL — ABNORMAL HIGH (ref 70–99)
Glucose-Capillary: 124 mg/dL — ABNORMAL HIGH (ref 70–99)

## 2019-08-01 MED ORDER — ENSURE ENLIVE PO LIQD
237.0000 mL | Freq: Two times a day (BID) | ORAL | Status: DC
  Administered 2019-08-01 – 2019-08-02 (×2): 237 mL via ORAL

## 2019-08-01 NOTE — Progress Notes (Signed)
Pt still without urinary output. Bladder scan shows 367ml at this time. Notified MD on call. New orders received.

## 2019-08-01 NOTE — Discharge Summary (Addendum)
Name: Dennis Macias MRN: [id number] DOB: 07-Jan-1951 69 y.o. PCP: [name], MD  Date of Admission: 07/31/2019  9:06 AM Date of Discharge: 08/01/19 Attending Physician: Sid Falcon, MD  Discharge Diagnosis: 1. Falls 2. Elevated PSA/ L Kidney Mass/Hematuria  3. Urinary Retention   Discharge Medications: Allergies as of 08/01/2019   No Known Allergies     Medication List    TAKE these medications   Accu-Chek FastClix Lancets Misc Check blood sugar up to 7 times a week as instructed   Accu-Chek Guide w/Device Kit 1 each by Does not apply route daily. Check blood sugar as instructed up to 7 times a week   amLODipine 10 MG tablet Commonly known as: NORVASC Take 1 tablet (10 mg total) by mouth daily.   aspirin 81 MG tablet Take 1 tablet (81 mg total) by mouth daily. Internal Medicine Program place on hold until patient requests to fill   atorvastatin 80 MG tablet Commonly known as: LIPITOR Take 1 tablet (80 mg total) by mouth at bedtime. IM program   dorzolamide-timolol 22.3-6.8 MG/ML ophthalmic solution Commonly known as: COSOPT Place 1 drop into the right eye 2 (two) times daily.   glucose blood test strip Commonly known as: Accu-Chek Guide Check blood sugar up to 7 times a week as instructed   metoprolol tartrate 25 MG tablet Commonly known as: LOPRESSOR Take 0.5 tablets (12.5 mg total) by mouth 2 (two) times daily.   sodium bicarbonate 650 MG tablet Take 650 mg by mouth 2 (two) times daily.   vitamin B-12 1000 MCG tablet Commonly known as: CYANOCOBALAMIN Take 1,000 mcg by mouth daily.            Durable Medical Equipment  (From admission, onward)         Start     Ordered   08/01/19 0918  For home use only DME lightweight manual wheelchair with seat cushion  Once    Comments: Patient suffers from weakness and falls which impairs their ability to perform daily activities at the home.  A walking aid will not resolve the issue with performing  activities of daily living. A wheelchair will allow patient to safely perform daily activities. Patient is not able to propel themselves in the home using a standard weight wheelchair due to weakness. Patient can self propel in the lightweight wheelchair. Length of need at least 3 months Accessories: elevating leg rests (ELRs), wheel locks, extensions and anti-tippers.   08/01/19 0211   08/01/19 0917  For home use only DME 3 n 1  Once     08/01/19 1735         Disposition and follow-up:   DennisCadarius Macias was discharged from [facility] in [place] condition.  At the hospital follow up visit please address:  1. Falls: Patient presented after sustaining a mechanical fall, after he tripped over the sidewalk ledge on his way to his nephrology appointment on the day of admission.  He hit his head but did not lose consciousness.  CT of the head and cervical spine unremarkable.  PT evaluated the patient and recommend home health -Please ensure patient is getting home health PT  2. Elevated PSA/ L Kidney Mass: Patient has a history of elevated PSA, last documented in March 2019 at 4.7. On 1/7, the patient had a renal ultrasound which demonstrated a mass on the right kidney with recommendation of following up with a CT or MRI to further characterize. Given the patient's history of  progressive unintentional 30 pound weight loss, weakness, decreased appetite, hematuria and extensive smoking history, this is highly concerning for underlying malignancy. -Recommend urgent follow-up with urology and abdominal imaging to characterize kidney mass. Also needs f/u for prostate. This information was relayed to pt's daughter. -We will call Urology to set up appointment urgently Monday 1/25 when their offices open.   ADDENDUM 1/25: I called [facility] and the patient is established with Dr. Jeffie Pollock. The patient currently has a an appointment on 1/28 with the NP, however the nurse I spoke with says she  would talk to Dr. Jeffie Pollock to make sure he gets an appointment with him for further evaluation.  We will call the patient for follow-up.  3. Urinary Retention: 640 cc on bladder scan. In and out cath was performed, but the patient retained several hours later. Foley was placed the day of discharge. I suspect it is likely secondary to underlying prostatic disease.   4.  Labs / imaging needed at time of follow-up: Needs MRI or CT to evaluate R kidney mass  5.  Pending labs/ test needing follow-up: PSA level  Follow-up Appointments:   Hospital Course by problem list: 1. Falls 2. Elevated PSA/ L Kidney Mass 3. Urinary Retention  In summary, [name] is a 69 year old male with past medical history significant forHTN, HLD, ESRD, CVA, T2DM, and elevated PSA, and hematuriawho presented with amechanical fall.  The patient initially presented after tripping over a sidewalk ledge on his way to his nephrology appointment the morning of 1/22.  Patient struck his head, but did not lose consciousness.  CT of the head and cervical spine were unremarkable.  Neurologic exam was reassuring and nonfocal, but did have bilateral lower extremity weakness. The patient states that he has been getting progressively weaker over the last 3 months.  He was fully independent of his ADLs and IADLs. This is also in the context of several months of poor appetite and 30 pound weight loss. Of note, the patient had a renal ultrasound performed on 1/5 which demonstrated a mass on the left kidney. Given the patient's history of unintentional 30 pound weight loss, weakness, decreased appetite, history of hematuria, elevated PSA, and extensive smoking history, his presentation is highly suspicious for underlying malignancy.  Patient will need urgent follow-up with urology in the outpatient setting to follow-up renal mass and elevated PSA.  Of note, was found to retained urine the day before discharge (640 cc). In an out cath was  performed and on repeat scan several hours later the pt was retaining urine again. I suspect this is likely secondary to underlying prostatic disease. Foley was placed on the day of discharge.  Patient was discharged with home health PT.  Patient's daughter, [name], is involved with her father's care.  All of this  information was relayed to her on the day of discharge.  Discharge Vitals:   BP (!) 155/79 (BP Location: Right Arm)   Pulse 65   Temp 97.9 F (36.6 C) (Oral)   Resp 15   SpO2 98%   Pertinent Labs, Studies, and Procedures:  CBC Latest Ref Rng & Units 08/01/2019 07/31/2019 07/27/2019  WBC 4.0 - 10.5 K/uL 6.1 7.8 5.5  Hemoglobin 13.0 - 17.0 g/dL 8.1(L) 9.9(L) 9.7(L)  Hematocrit 39.0 - 52.0 % 24.4(L) 30.8(L) 29.6(L)  Platelets 150 - 400 K/uL 291 340 323   BMP Latest Ref Rng & Units 08/01/2019 07/31/2019 07/27/2019  Glucose 70 - 99 mg/dL 103(H) 123(H) 125(H)  BUN 8 -  23 mg/dL 25(H) 25(H) 22  Creatinine 0.61 - 1.24 mg/dL 3.34(H) 3.48(H) 3.58(H)  BUN/Creat Ratio 10 - 24 - - -  Sodium 135 - 145 mmol/L 142 141 140  Potassium 3.5 - 5.1 mmol/L 4.2 4.6 4.2  Chloride 98 - 111 mmol/L 115(H) 113(H) 110  CO2 22 - 32 mmol/L 19(L) 19(L) 19(L)  Calcium 8.9 - 10.3 mg/dL 8.1(L) 8.5(L) 8.6(L)   CT Head: IMPRESSION: 1. Scalp contusion over the left frontal bone without underlying fracture or acute intracranial abnormality. 2. No acute abnormality cervical spine. 3. Small bilateral pleural effusions. 4. Cervical spondylosis.  Lumbar spine XR: IMPRESSION: No acute osseous abnormality. Mild degenerative change  CXR: IMPRESSION: No active disease.  R forearm XR: IMPRESSION: No acute fracture.  L forearm XR: IMPRESSION: No acute fracture.  Renal U/S from 1/5: IMPRESSION: 1. Focal echogenic lesion in the inferior aspect of the left kidney. Further characterization with renal mass protocol CT or MRI recommended. 2. A 7 mm nonobstructing left renal interpolar calculus.  No hydronephrosis. 3. Small ascites  Discharge Instructions: Discharge Instructions    Call MD for:  difficulty breathing, headache or visual disturbances   Complete by: As directed    Call MD for:  extreme fatigue   Complete by: As directed    Call MD for:  hives   Complete by: As directed    Call MD for:  persistant dizziness or light-headedness   Complete by: As directed    Call MD for:  persistant nausea and vomiting   Complete by: As directed    Call MD for:  redness, tenderness, or signs of infection (pain, swelling, redness, odor or green/yellow discharge around incision site)   Complete by: As directed    Call MD for:  severe uncontrolled pain   Complete by: As directed    Call MD for:  temperature >100.4   Complete by: As directed    Diet - low sodium heart healthy   Complete by: As directed    Discharge instructions   Complete by: As directed    Thank you for allowing [place] to take care of you during your hospitalization.  Below is a summary of what we treated:  1.  Falls -We had you work with our physical therapist who recommends home health physical therapy.  2.  Elevated PSA -We recommend you follow-up with your urologist in the outpatient setting to evaluate the need for prostate biopsy.  3.  Follow-up -We recommend following up with your primary care provider within 2 weeks of being discharged from hospital  If you have any questions or concerns, please feel free to reach out to [place].   Face-to-face encounter (required for Medicare/Medicaid patients)   Complete by: As directed    I Earlene Plater certify that this patient is under my care and that I, or a nurse practitioner or physician's assistant working with me, had a face-to-face encounter that meets the physician face-to-face encounter requirements with this patient on 08/01/2019. The encounter with the patient was in whole, or in part for the following medical condition(s) which is the primary reason for home  health care (List medical condition): weakness, falls   The encounter with the patient was in whole, or in part, for the following medical condition, which is the primary reason for home health care: Weakness, Falls   I certify that, based on my findings, the following services are medically necessary home health services: Physical therapy   Reason for Medically [place]  Services:  Therapy- Personnel officer, Public librarian Therapy- Instruction on use of Assistive Device for Ambulation on all Surfaces Therapy- Instruction on Safe use of Assistive Devices for ADLs Therapy- Home Adaptation to Facilitate Safety Therapy- Therapeutic Exercises to Increase Strength and Endurance Skilled Nursing- Change/Decline in Patient Status     My clinical findings support the need for the above services:  Unable to leave home safely without assistance and/or assistive device Unsafe ambulation due to balance issues     Further, I certify that my clinical findings support that this patient is homebound due to:  Unable to leave home safely without assistance Ambulates short distances less than 300 feet     Home Health   Complete by: As directed    To provide the following care/treatments: PT   Increase activity slowly   Complete by: As directed      Signed: Earlene Plater, MD Internal Medicine, PGY1 Pager: [phone]  08/03/2019,7:24 AM

## 2019-08-01 NOTE — Progress Notes (Signed)
Talked to pt girlfriend and she has questions about the discharge home.  She would like the doctor to call their daughter, Jonavan Vanhorn at [phone].  Dr. texted.

## 2019-08-01 NOTE — Progress Notes (Signed)
Pt has 640 cc in his bladder per bladder scan.

## 2019-08-01 NOTE — Progress Notes (Signed)
Physical Therapy Treatment Patient Details Name: Dennis Macias MRN: [id number] DOB: Aug 01, 1950 Today's Date: 08/01/2019    History of Present Illness 69 y.o male with PMHx significant for HTN, HLD, ESRD, CVA, T2DM, and elevated PSA who presents with a fall. Patient states he was on his way to his nephrologist appointment and tripped over the curb and hit his head. No LOC. Pt reports he has been experiencing progressive lower extremity weakness.     PT Comments    Patient in bed upon arrival - nursing staff stating he has not been out of bed on this date. Patient agreeable to OOB mobility with PT with use of RW for stability. Patient requiring Min A for sit to stand at bedside from lower surface with min guard throughout gait for safety. Patient easily fatigued. PT to continue to follow acutely to progress safe and independent functional mobility.     Follow Up Recommendations  Home health PT;Supervision for mobility/OOB     Equipment Recommendations  3in1 (PT);Wheelchair (measurements PT);Wheelchair cushion (measurements PT)    Recommendations for Other Services       Precautions / Restrictions Precautions Precautions: Fall    Mobility  Bed Mobility Overal bed mobility: Modified Independent             General bed mobility comments: Increased time/effort to rise  Transfers Overall transfer level: Needs assistance Equipment used: Rolling walker (2 wheeled) Transfers: Sit to/from Stand Sit to Stand: Min assist         General transfer comment: Min A from lower bed surface - reports he has not been up today leading to some weakness  Ambulation/Gait Ambulation/Gait assistance: Min guard Gait Distance (Feet): 150 Feet Assistive device: Rolling walker (2 wheeled) Gait Pattern/deviations: Step-through pattern;Decreased stride length;Trunk flexed Gait velocity: decreased   General Gait Details: Increased bilateral knee flexion in midstance but no buckle. Slow and steady  pace, forward head posture.   Stairs             Wheelchair Mobility    Modified Rankin (Stroke Patients Only)       Balance Overall balance assessment: Needs assistance Sitting-balance support: Feet supported Sitting balance-Leahy Scale: Good     Standing balance support: Bilateral upper extremity supported Standing balance-Leahy Scale: Poor Standing balance comment: moderate reliance through arms on walker                            Cognition Arousal/Alertness: Awake/alert Behavior During Therapy: WFL for tasks assessed/performed Overall Cognitive Status: Difficult to assess                                        Exercises      General Comments        Pertinent Vitals/Pain Pain Assessment: No/denies pain    Home Living                      Prior Function            PT Goals (current goals can now be found in the care plan section) Acute Rehab PT Goals Patient Stated Goal: "get stronger." PT Goal Formulation: With patient/family Time For Goal Achievement: 08/14/19 Potential to Achieve Goals: Good Progress towards PT goals: Progressing toward goals    Frequency    Min 3X/week      PT Plan  Current plan remains appropriate    Co-evaluation              AM-PAC PT "6 Clicks" Mobility   Outcome Measure  Help needed turning from your back to your side while in a flat bed without using bedrails?: None Help needed moving from lying on your back to sitting on the side of a flat bed without using bedrails?: None Help needed moving to and from a bed to a chair (including a wheelchair)?: A Little Help needed standing up from a chair using your arms (e.g., wheelchair or bedside chair)?: A Little Help needed to walk in hospital room?: A Little Help needed climbing 3-5 steps with a railing? : A Lot 6 Click Score: 19    End of Session Equipment Utilized During Treatment: Gait belt Activity Tolerance:  Patient tolerated treatment well Patient left: in bed;with call bell/phone within reach;with family/visitor present Nurse Communication: Mobility status PT Visit Diagnosis: Unsteadiness on feet (R26.81);Muscle weakness (generalized) (M62.81);Difficulty in walking, not elsewhere classified (R26.2)     Time: 7125-2712 PT Time Calculation (min) (ACUTE ONLY): 21 min  Charges:  $Gait Training: 8-22 mins                     Lanney Gins, PT, DPT Supplemental Physical Therapist 08/01/19 3:35 PM Pager: [phone] Office: [phone]

## 2019-08-01 NOTE — Progress Notes (Signed)
Pt was I/O cathed for 635 cc and UA, micro sent to lab.  Dr. In to talk with pt, daughter in to see pt.

## 2019-08-01 NOTE — Progress Notes (Signed)
Notified MD regarding pt not voiding overnight, bladder scan show 97. Awaiting orders, will monitor pt.

## 2019-08-01 NOTE — Progress Notes (Signed)
Pt/daughter states he likes chocolate ensure, poor appetite.

## 2019-08-01 NOTE — Progress Notes (Addendum)
Subjective:   Dennis Macias was examined at bedside this morning and he states that he feels fine. He has not yet had home PT and we made him aware that we would place an order for home health PT. He is unsure if he has been evaluated by a urologist in the past. He is amenable to discharging today.   Objective: CBC Latest Ref Rng & Units 08/01/2019 07/31/2019 07/27/2019  WBC 4.0 - 10.5 K/uL 6.1 7.8 5.5  Hemoglobin 13.0 - 17.0 g/dL 8.1(L) 9.9(L) 9.7(L)  Hematocrit 39.0 - 52.0 % 24.4(L) 30.8(L) 29.6(L)  Platelets 150 - 400 K/uL 291 340 323   BMP Latest Ref Rng & Units 08/01/2019 07/31/2019 07/27/2019  Glucose 70 - 99 mg/dL 103(H) 123(H) 125(H)  BUN 8 - 23 mg/dL 25(H) 25(H) 22  Creatinine 0.61 - 1.24 mg/dL 3.34(H) 3.48(H) 3.58(H)  BUN/Creat Ratio 10 - 24 - - -  Sodium 135 - 145 mmol/L 142 141 140  Potassium 3.5 - 5.1 mmol/L 4.2 4.6 4.2  Chloride 98 - 111 mmol/L 115(H) 113(H) 110  CO2 22 - 32 mmol/L 19(L) 19(L) 19(L)  Calcium 8.9 - 10.3 mg/dL 8.1(L) 8.5(L) 8.6(L)    Vital signs in last 24 hours: Vitals:   07/31/19 1413 07/31/19 2200 08/01/19 0135 08/01/19 0450  BP: (!) 155/80 (!) 159/79 (!) 153/85 (!) 155/79  Pulse: 60 63 60 65  Resp: 16 19 17 15   Temp: 97.7 F (36.5 C) 98.1 F (36.7 C) 98.4 F (36.9 C) 97.9 F (36.6 C)  TempSrc: Oral Oral Oral Oral  SpO2: 100% 100% 100% 98%    Physical Exam General: Comfortably resting in bed, NAD HEENT: Healing abrasions on the forehead, otherwise NCAT CV: RRR, normal S1-S2 no murmurs rubs or gallops appreciated PULM: Clear to auscultation bilaterally, no crackles or wheezes appreciated ABD: Soft and nontender in all quadrants NEURO: 5/5 upper extremity strength bilaterally. 4/5 lower extremity strength bilaterally.  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  This is in the context of several months of poor appetite and 30 pound weight loss. The patient's history of evaded PSA, weight loss and more frequent falls is concerning for a malignancy compressing the spinal cord, however the patient's neurologic exam is nonfocal and the patient does not exhibit any urinary or bowel incontinence.  #Mechanical fall: Patient states he tripped over the sidewalk ledge on his way to his nephrology appointment this morning.  Did not lose consciousness.  CT head and cervical spine unremarkable.  Neurologic exam is reassuring, and I do not believe additional imaging is indicated at this time. -Vitamin B12 levels normal -PT evaluated and recommends home health  #Hx hematuria #Elevated PSA: PSA elevated to 4.7 in March 2019 then subsequently 4.34 in June 2019. Patient was supposed to continue following up with Urology to evaluate the need for prostate biopsy, however it is unclear if the patient ever had this done. Patient had a CT of the abdomen and pelvis in 2019 which did not demonstrate any  blastic or lytic lesions.  Patient does have a history of hematuria recently and has extensive smoking history.  High concern for malignancy, especially in the context of progressive weakness, decreased appetite and 30 pound weight loss over the last several months. -Obtain PSA level prior to discharge -Patient will need urgent follow-up with urology in the outpatient setting  #CKD stage IV: Patient's creatinine is elevated to 3.34, which is at his baseline. -Monitor with daily BMPs  #Hx CVA #HTN #HLD -Amlodipine 10 mg daily -Metoprolol 12.5 mg  twice daily -Aspirin 81 mg daily -Lipitor 80 mg daily  #Hx T2DM: Patient has had diabetes in the past, however A1c 1 month ago was 5.3.  Does not take any diabetes medications at home. -SSI  #Hx BL cataracts s/p extraction #Hx Glaucoma  #Hx R eye vision loss -Cosopt ophthalmic solution 1 drop both eyes twice daily  #FEN/GI -Diet: Heart healthy, carb modified/1200 cc fluid restriction -Fluids: None  #DVT prophylaxis -Heparin 5000 units subcu injections every 8 hours  #CODE STATUS: Full  #Dispo Prior to Admission Living Arrangement: Home Anticipated Discharge Location: Home Barriers to Discharge: None, will discharge with home health PT  Earlene Plater, MD Internal Medicine, PGY1 Pager: [phone]  08/01/2019,10:18 AM    Will need urgent Urology referral for work up of prostate cancer, bladder cancer given findings

## 2019-08-01 NOTE — Progress Notes (Addendum)
  Date: 08/01/2019  Patient name: Dennis Macias  Medical record number: [id number]  Date of birth: 03/09/1951   I have seen and evaluated Dennis Macias and discussed their care with the Residency Team. Briefly, Dennis Macias came in with a mechanical fall.  He has had issues with generalized weakness, poor appetite, weight loss.  He previously had a mildly elevated PSA and was supposed to follow up with Urology, but that follow up did not happen.  Last PSA in our system was 5.3.  He had no fractures or other issues on imaging during this hospitalization.  He has no focal weakness.  Labs show known CKD, very low albumin, normocytic anemia which is slightly worse than previous evaluations.  He also has small hgb in the urine on UA which has been present off and on since 2018.  Soc Hx : Former tobacco use, smoke and chew  Vitals:   08/01/19 0135 08/01/19 0450  BP: (!) 153/85 (!) 155/79  Pulse: 60 65  Resp: 17 15  Temp: 98.4 F (36.9 C) 97.9 F (36.6 C)  SpO2: 100% 98%   Physical Exam: Frail appearing, mild muscle wasting Eyes: Anicteric sclerae CV: RR, NR, no murmur Pulm: CTAB, no wheezing Abd: Soft, +BS MSK: Muscle tone low for age Neuro: Moving all extremities equally, 4+/5 in bilateral lower extremities, speech is clear, no facial droop  Assessment and Plan: I have seen and evaluated the patient as outlined above. I agree with the formulated Assessment and Plan as detailed in the residents' note, with the following changes:   1. Mechanical fall - No injuries noted on imaging - PT worked with patient, will benefit from wheelchair in the home - [place] PT  2. Weight loss, weakness, mild malnutrition (loss of muscle mass, muscle wasting, weakness) - Check PSA - Will need urgent Urology referral for work up of prostate cancer, bladder cancer given findings - [place] [facility]  Other issues per Dr. Redgie Grayer note.   UPDATE: Patient with new urinary retention.  Will do I/o cath and keep  overnight.  Eval for UTI and for blood.  ? Blood clots.  May need foley and urgent urology OP referral.   Sid Falcon, MD 1/23/20219:31 AM

## 2019-08-02 DIAGNOSIS — S0003XA Contusion of scalp, initial encounter: Secondary | ICD-10-CM | POA: Diagnosis not present

## 2019-08-02 DIAGNOSIS — E441 Mild protein-calorie malnutrition: Secondary | ICD-10-CM | POA: Diagnosis not present

## 2019-08-02 DIAGNOSIS — R339 Retention of urine, unspecified: Secondary | ICD-10-CM | POA: Diagnosis not present

## 2019-08-02 DIAGNOSIS — N184 Chronic kidney disease, stage 4 (severe): Secondary | ICD-10-CM | POA: Diagnosis not present

## 2019-08-02 DIAGNOSIS — R531 Weakness: Secondary | ICD-10-CM | POA: Diagnosis not present

## 2019-08-02 DIAGNOSIS — R634 Abnormal weight loss: Secondary | ICD-10-CM | POA: Diagnosis not present

## 2019-08-02 DIAGNOSIS — N2889 Other specified disorders of kidney and ureter: Secondary | ICD-10-CM | POA: Diagnosis not present

## 2019-08-02 DIAGNOSIS — R319 Hematuria, unspecified: Secondary | ICD-10-CM | POA: Diagnosis not present

## 2019-08-02 DIAGNOSIS — Z20822 Contact with and (suspected) exposure to covid-19: Secondary | ICD-10-CM | POA: Diagnosis not present

## 2019-08-02 DIAGNOSIS — Z960 Presence of urogenital implants: Secondary | ICD-10-CM

## 2019-08-02 DIAGNOSIS — R972 Elevated prostate specific antigen [PSA]: Secondary | ICD-10-CM | POA: Diagnosis not present

## 2019-08-02 DIAGNOSIS — N401 Enlarged prostate with lower urinary tract symptoms: Secondary | ICD-10-CM

## 2019-08-02 DIAGNOSIS — I120 Hypertensive chronic kidney disease with stage 5 chronic kidney disease or end stage renal disease: Secondary | ICD-10-CM | POA: Diagnosis not present

## 2019-08-02 DIAGNOSIS — R4182 Altered mental status, unspecified: Secondary | ICD-10-CM | POA: Diagnosis not present

## 2019-08-02 DIAGNOSIS — R338 Other retention of urine: Secondary | ICD-10-CM | POA: Diagnosis not present

## 2019-08-02 DIAGNOSIS — E11319 Type 2 diabetes mellitus with unspecified diabetic retinopathy without macular edema: Secondary | ICD-10-CM | POA: Diagnosis not present

## 2019-08-02 DIAGNOSIS — R29898 Other symptoms and signs involving the musculoskeletal system: Secondary | ICD-10-CM | POA: Diagnosis not present

## 2019-08-02 DIAGNOSIS — Z9181 History of falling: Secondary | ICD-10-CM

## 2019-08-02 DIAGNOSIS — Z8673 Personal history of transient ischemic attack (TIA), and cerebral infarction without residual deficits: Secondary | ICD-10-CM

## 2019-08-02 DIAGNOSIS — E785 Hyperlipidemia, unspecified: Secondary | ICD-10-CM | POA: Diagnosis not present

## 2019-08-02 LAB — GLUCOSE, CAPILLARY
Glucose-Capillary: 91 mg/dL (ref 70–99)
Glucose-Capillary: 108 mg/dL — ABNORMAL HIGH (ref 70–99)

## 2019-08-02 LAB — PSA, TOTAL AND FREE
PSA, Free Pct: 40 %
PSA, Free: 1.36 ng/mL
Prostate Specific Ag, Serum: 3.4 ng/mL (ref 0.0–4.0)

## 2019-08-02 MED ORDER — TAMSULOSIN HCL 0.4 MG PO CAPS: 0.4000 mg | ORAL_CAPSULE | Freq: Every day | ORAL | 0 refills | Status: DC

## 2019-08-02 MED ORDER — TAMSULOSIN HCL 0.4 MG PO CAPS: 0.4000 mg | ORAL_CAPSULE | Freq: Every day | ORAL | Status: DC

## 2019-08-02 MED ORDER — CHLORHEXIDINE GLUCONATE CLOTH 2 % EX PADS
6.0000 | MEDICATED_PAD | Freq: Every day | CUTANEOUS | Status: DC
  Administered 2019-08-02: 6 via TOPICAL

## 2019-08-02 NOTE — Progress Notes (Signed)
  Date: 08/02/2019  Patient name: Dennis Macias  Medical record number: [id number]  Date of birth: 1950/08/23   This patient's plan of care was discussed with the house staff. Please see their note for complete details. I concur with their findings.  On review of chart, patient also with a renal mass noted on Ultrasound of the kidneys.  This will need to be followed up at Urology as well.  Appointment to be made tomorrow.    Sid Falcon, MD 08/02/2019, 12:36 PM

## 2019-08-02 NOTE — Progress Notes (Signed)
Reviewed discharge plans with Dennis Macias (daughter) and AVS, plan for DC at 1400, I will call Dennis Macias to make sure she has everything she needs for DC planning.  Reviewed use of flomax as a new med and foley catheter which Dennis Macias states he has had before and knows how to use a leg bag and a standard drainage bag.  Dennis Macias knows to call tomorrow am for an appointment for [facility] for follow up.

## 2019-08-02 NOTE — TOC Transition Note (Signed)
Transition of Care [facility]) - CM/SW Discharge Note   Patient Details  Name: Dennis Macias MRN: [id number] Date of Birth: [birth year]  Transition of Care [facility]) CM/SW Contact:  Claudie Leach, RN 08/02/2019, 11:53 AM   Clinical Narrative:    Discussed DC plan with wife.  She and patient have no preference of [place] agency.  HH referral accepted by [facility].    Patient needs 3n1 and WC.  [employer] does not have wheelchairs in house.  [employer] will deliver 3n1 to room prior to dc.   [facility] [employer] can deliver [facility] to patient's home tomorrow.  Wife is agreeable to this plan.     Final next level of care: [place] Barriers to Discharge: No Barriers Identified   Patient Goals and CMS Choice Patient states their goals for this hospitalization and ongoing recovery are:: to get home CMS Medicare.gov Compare Post Acute Care list provided to:: Patient Represenative (must comment)(wife) Choice offered to / list presented to : Spouse  Discharge Placement                       Discharge Plan and Services                DME Arranged: 3-N-1, Lightweight manual wheelchair with seat cushion DME Agency: ([employer]; [facility] [employer] for [facility]) Date DME Agency Contacted: 08/02/19 Time DME Agency Contacted: [id number] Representative spoke with at DME Agency: Jill Side at [place]: [place]: [place] Date [place]: 08/02/19 Time [place]: 1152 Representative spoke with at [address]: [place] Interventions     Readmission Risk Interventions No flowsheet data found.

## 2019-08-02 NOTE — Progress Notes (Signed)
Subjective: HD#1   Overnight: Bladder scan 347 cc. Foley catheter placed.   Today, Dennis Macias states that he is feeling "fair." he slept well without any issues. He currently denies abdominal pain, nausea, vomiting. He states that he has not had any trouble urinating. Per report yesterday, he states that he just didn't have the need to get up to urinate. I made him aware that we will be discharging him with the foley catheter and have him follow up with Urology.   Objective:  Vital signs in last 24 hours: Vitals:   08/01/19 1101 08/01/19 1349 08/01/19 2029 08/02/19 0410  BP: 134/65 139/87 (!) 146/73 (!) 145/78  Pulse: 68 (!) 56 60 66  Resp: 14 16 20 16   Temp: 98.5 F (36.9 C) 98 F (36.7 C) 98.6 F (37 C) 98.5 F (36.9 C)  TempSrc: Oral Oral Oral Oral  SpO2: 100% 100% 100% 99%   Const: In no apparent distress, lying comfortably in bed, conversational Resp: CTA BL, no wheezes, crackles, rhonchi CV: RRR, no murmurs, gallop, rub Abd: Bowel sounds present, nondistended, nontender to palpation GU: Foley catheter in place with clear yellow urine in bag.    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 This is in the context of several months of poor appetite and 30 pound weight loss.The patient's history of evaded PSA, weight loss and more frequent falls is concerning for a malignancy compressing the spinal cord, however the patient's neurologic exam is nonfocal and the patient does not exhibit any  urinary or bowel incontinence.  #Mechanical fall:Patient states he tripped over the sidewalk ledge on his way to his nephrology appointment this morning. Did not lose consciousness. CT head and cervical spine unremarkable.Neurologic exam is reassuring, and I do not believe additional imaging is indicated at this time. -Vitamin B12 levels normal -PT evaluated and recommends home health  #Hx hematuria #Elevated PSA: PSA elevated to 4.7 in March 2019 then subsequently 4.34 in June 2019.Patient was supposed to continue following up with Urology to evaluate the need for prostate biopsy, however it is unclear if the patient ever had this done.Patient had a CT of the abdomen and pelvis in 2019 which did not demonstrate any blastic or lytic lesions.  Patient does have a history of hematuria recently and has extensive smoking history.  High concern for malignancy, especially in the context of progressive weakness, decreased appetite and 30 pound weight loss over the last several months. -Free and total PSA pending  -Patient will need urgent follow-up with urology in the outpatient setting. Will call to make appointment tomorrow   #CKD stage IV:sCr 3.3 (Baseline 3.3-3.6) -Monitor with daily BMPs  #Hx CVA #HTN #HLD -Amlodipine 10 mg daily -Metoprolol 12.5 mg twice daily -Aspirin 81 mg daily -Lipitor 80 mg daily  #Urinary retention  -Serial bladder scans elevated >330cc. Likely due to underlying prostatic disease.  -Foley catheter placed -Will call [facility] Monday and make appointment -Start Flomax  #HxT2DM:Patient has had diabetes in the past, however A1c 1 month ago was  5.3.Does not take any diabetes medications at home. -SSI  #Hx BLcataractss/p extraction #Hx Glaucoma  #Hx R eye vision loss -Cosopt ophthalmic solution 1 dropbotheyes twice daily  #FEN/GI -Diet: Heart healthy, carb modified/1200 cc fluid restriction -Fluids: None  #DVT prophylaxis -Heparin  5000 units subcu injections every 8 hours  #CODE STATUS: Full  #Dispo Prior to Admission Living Arrangement: Home Anticipated Discharge Location: Home Barriers to Discharge: None, will discharge with home health PT   Jean Rosenthal, MD 08/02/2019, 6:52 AM Pager: [phone] Internal Medicine Teaching Service

## 2019-08-02 NOTE — Progress Notes (Signed)
3N1 has been delivered to the room and pt is eating lunch presently and will be ready for DC at 1400.  Dtr is to pick pt up downstairs.  Pt given supplies for foley catheter (leg bag and standard drainage bag) and cylinder for emptying urine.  All daily meds given to pt prior to DC.  DC instruction folder with pt belongings for DC.

## 2019-08-02 NOTE — Progress Notes (Signed)
Updated dtr on plan of care, she will call me once she has gotten to the hospital to pick him up.

## 2019-08-03 MED FILL — TAMSULOSIN HCL 0.4 MG CAP: 0.4 | 30 days supply | Qty: 30 | Fill #0

## 2019-08-04 ENCOUNTER — Ambulatory Visit (INDEPENDENT_AMBULATORY_CARE_PROVIDER_SITE_OTHER): Payer: Medicare PPO | Admitting: Internal Medicine

## 2019-08-04 ENCOUNTER — Inpatient Hospital Stay (HOSPITAL_COMMUNITY)
Admission: AD | Admit: 2019-08-04 | Discharge: 2019-08-10 | DRG: 683 | Disposition: A | Discharge status: Home Health | Payer: Medicare PPO | Source: Ambulatory Visit | Attending: Internal Medicine | Admitting: Internal Medicine

## 2019-08-04 ENCOUNTER — Other Ambulatory Visit: Payer: Self-pay

## 2019-08-04 ENCOUNTER — Encounter: Payer: Self-pay | Admitting: Internal Medicine

## 2019-08-04 VITALS — BP 121/67 | HR 63 | Temp 98.1°F | Ht 72.0 in | Wt 193.3 lb

## 2019-08-04 DIAGNOSIS — N184 Chronic kidney disease, stage 4 (severe): Secondary | ICD-10-CM | POA: Diagnosis present

## 2019-08-04 DIAGNOSIS — I129 Hypertensive chronic kidney disease with stage 1 through stage 4 chronic kidney disease, or unspecified chronic kidney disease: Principal | ICD-10-CM | POA: Diagnosis present

## 2019-08-04 DIAGNOSIS — R609 Edema, unspecified: Secondary | ICD-10-CM | POA: Diagnosis not present

## 2019-08-04 DIAGNOSIS — E785 Hyperlipidemia, unspecified: Secondary | ICD-10-CM | POA: Diagnosis present

## 2019-08-04 DIAGNOSIS — I10 Essential (primary) hypertension: Secondary | ICD-10-CM | POA: Diagnosis present

## 2019-08-04 DIAGNOSIS — E11319 Type 2 diabetes mellitus with unspecified diabetic retinopathy without macular edema: Secondary | ICD-10-CM

## 2019-08-04 DIAGNOSIS — R338 Other retention of urine: Secondary | ICD-10-CM | POA: Diagnosis present

## 2019-08-04 DIAGNOSIS — N138 Other obstructive and reflux uropathy: Secondary | ICD-10-CM | POA: Diagnosis present

## 2019-08-04 DIAGNOSIS — R600 Localized edema: Secondary | ICD-10-CM | POA: Diagnosis present

## 2019-08-04 DIAGNOSIS — Z23 Encounter for immunization: Secondary | ICD-10-CM | POA: Diagnosis present

## 2019-08-04 DIAGNOSIS — Z9842 Cataract extraction status, left eye: Secondary | ICD-10-CM | POA: Diagnosis not present

## 2019-08-04 DIAGNOSIS — C642 Malignant neoplasm of left kidney, except renal pelvis: Secondary | ICD-10-CM | POA: Diagnosis present

## 2019-08-04 DIAGNOSIS — D649 Anemia, unspecified: Secondary | ICD-10-CM | POA: Diagnosis present

## 2019-08-04 DIAGNOSIS — E1122 Type 2 diabetes mellitus with diabetic chronic kidney disease: Secondary | ICD-10-CM | POA: Diagnosis present

## 2019-08-04 DIAGNOSIS — Z20822 Contact with and (suspected) exposure to covid-19: Secondary | ICD-10-CM | POA: Diagnosis present

## 2019-08-04 DIAGNOSIS — E1139 Type 2 diabetes mellitus with other diabetic ophthalmic complication: Secondary | ICD-10-CM | POA: Diagnosis not present

## 2019-08-04 DIAGNOSIS — Z87891 Personal history of nicotine dependence: Secondary | ICD-10-CM

## 2019-08-04 DIAGNOSIS — N2889 Other specified disorders of kidney and ureter: Secondary | ICD-10-CM | POA: Diagnosis not present

## 2019-08-04 DIAGNOSIS — Z8673 Personal history of transient ischemic attack (TIA), and cerebral infarction without residual deficits: Secondary | ICD-10-CM

## 2019-08-04 DIAGNOSIS — I499 Cardiac arrhythmia, unspecified: Secondary | ICD-10-CM | POA: Diagnosis not present

## 2019-08-04 DIAGNOSIS — Z9841 Cataract extraction status, right eye: Secondary | ICD-10-CM | POA: Diagnosis not present

## 2019-08-04 DIAGNOSIS — S41112A Laceration without foreign body of left upper arm, initial encounter: Secondary | ICD-10-CM | POA: Diagnosis present

## 2019-08-04 DIAGNOSIS — Z79899 Other long term (current) drug therapy: Secondary | ICD-10-CM

## 2019-08-04 DIAGNOSIS — I493 Ventricular premature depolarization: Secondary | ICD-10-CM | POA: Diagnosis present

## 2019-08-04 DIAGNOSIS — N400 Enlarged prostate without lower urinary tract symptoms: Secondary | ICD-10-CM | POA: Diagnosis present

## 2019-08-04 DIAGNOSIS — R601 Generalized edema: Secondary | ICD-10-CM | POA: Diagnosis present

## 2019-08-04 DIAGNOSIS — E441 Mild protein-calorie malnutrition: Secondary | ICD-10-CM | POA: Diagnosis present

## 2019-08-04 DIAGNOSIS — H5461 Unqualified visual loss, right eye, normal vision left eye: Secondary | ICD-10-CM | POA: Diagnosis present

## 2019-08-04 DIAGNOSIS — X58XXXA Exposure to other specified factors, initial encounter: Secondary | ICD-10-CM | POA: Diagnosis present

## 2019-08-04 DIAGNOSIS — Z6824 Body mass index (BMI) 24.0-24.9, adult: Secondary | ICD-10-CM

## 2019-08-04 DIAGNOSIS — H409 Unspecified glaucoma: Secondary | ICD-10-CM | POA: Diagnosis present

## 2019-08-04 DIAGNOSIS — Z8349 Family history of other endocrine, nutritional and metabolic diseases: Secondary | ICD-10-CM

## 2019-08-04 DIAGNOSIS — Z9181 History of falling: Secondary | ICD-10-CM

## 2019-08-04 DIAGNOSIS — S41111A Laceration without foreign body of right upper arm, initial encounter: Secondary | ICD-10-CM | POA: Diagnosis present

## 2019-08-04 DIAGNOSIS — N049 Nephrotic syndrome with unspecified morphologic changes: Secondary | ICD-10-CM

## 2019-08-04 DIAGNOSIS — N401 Enlarged prostate with lower urinary tract symptoms: Secondary | ICD-10-CM | POA: Diagnosis present

## 2019-08-04 DIAGNOSIS — R39198 Other difficulties with micturition: Secondary | ICD-10-CM | POA: Diagnosis not present

## 2019-08-04 DIAGNOSIS — E46 Unspecified protein-calorie malnutrition: Secondary | ICD-10-CM | POA: Diagnosis not present

## 2019-08-04 DIAGNOSIS — Z7982 Long term (current) use of aspirin: Secondary | ICD-10-CM

## 2019-08-04 DIAGNOSIS — Z8249 Family history of ischemic heart disease and other diseases of the circulatory system: Secondary | ICD-10-CM

## 2019-08-04 DIAGNOSIS — R188 Other ascites: Secondary | ICD-10-CM | POA: Diagnosis not present

## 2019-08-04 DIAGNOSIS — H42 Glaucoma in diseases classified elsewhere: Secondary | ICD-10-CM | POA: Diagnosis not present

## 2019-08-04 LAB — COMPREHENSIVE METABOLIC PANEL
ALT: 11 U/L (ref 0–44)
AST: 22 U/L (ref 15–41)
Albumin: 2 g/dL — ABNORMAL LOW (ref 3.5–5.0)
Alkaline Phosphatase: 61 U/L (ref 38–126)
Anion gap: 8 (ref 5–15)
BUN: 28 mg/dL — ABNORMAL HIGH (ref 8–23)
CO2: 18 mmol/L — ABNORMAL LOW (ref 22–32)
Calcium: 8.3 mg/dL — ABNORMAL LOW (ref 8.9–10.3)
Chloride: 115 mmol/L — ABNORMAL HIGH (ref 98–111)
Creatinine, Ser: 3.06 mg/dL — ABNORMAL HIGH (ref 0.61–1.24)
GFR calc Af Amer: 23 mL/min — ABNORMAL LOW (ref >60)
GFR calc non Af Amer: 20 mL/min — ABNORMAL LOW (ref >60)
Glucose, Bld: 130 mg/dL — ABNORMAL HIGH (ref 70–99)
Potassium: 5.3 mmol/L — ABNORMAL HIGH (ref 3.5–5.1)
Sodium: 141 mmol/L (ref 135–145)
Total Bilirubin: 0.5 mg/dL (ref 0.3–1.2)
Total Protein: 4.9 g/dL — ABNORMAL LOW (ref 6.5–8.1)

## 2019-08-04 LAB — GLUCOSE, CAPILLARY
Glucose-Capillary: 110 mg/dL — ABNORMAL HIGH (ref 70–99)
Glucose-Capillary: 158 mg/dL — ABNORMAL HIGH (ref 70–99)

## 2019-08-04 LAB — MAGNESIUM: Magnesium: 1.6 mg/dL — ABNORMAL LOW (ref 1.7–2.4)

## 2019-08-04 LAB — PROTEIN / CREATININE RATIO, URINE
Creatinine, Urine: 93.89 mg/dL
Protein Creatinine Ratio: 1.93 mg/mg{Cre} — ABNORMAL HIGH (ref 0.00–0.15)
Total Protein, Urine: 181 mg/dL

## 2019-08-04 MED ORDER — HEPARIN SODIUM (PORCINE) 5000 UNIT/ML IJ SOLN
5000.0000 [IU] | Freq: Three times a day (TID) | INTRAMUSCULAR | Status: DC
  Administered 2019-08-04 – 2019-08-10 (×17): 5000 [IU] via SUBCUTANEOUS
  Filled 2019-08-04 (×17): qty 1

## 2019-08-04 MED ORDER — ASPIRIN EC 81 MG PO TBEC
81.0000 mg | DELAYED_RELEASE_TABLET | Freq: Every day | ORAL | Status: DC
  Administered 2019-08-05 – 2019-08-10 (×6): 81 mg via ORAL
  Filled 2019-08-04 (×7): qty 1

## 2019-08-04 MED ORDER — INSULIN ASPART 100 UNIT/ML ~~LOC~~ SOLN
0.0000 [IU] | Freq: Three times a day (TID) | SUBCUTANEOUS | Status: DC
  Administered 2019-08-05 – 2019-08-07 (×2): 1 [IU] via SUBCUTANEOUS
  Administered 2019-08-08: 18:00:00 2 [IU] via SUBCUTANEOUS
  Administered 2019-08-08 – 2019-08-09 (×2): 1 [IU] via SUBCUTANEOUS

## 2019-08-04 MED ORDER — FUROSEMIDE 10 MG/ML IJ SOLN
40.0000 mg | Freq: Two times a day (BID) | INTRAMUSCULAR | Status: DC
  Administered 2019-08-05 – 2019-08-07 (×6): 40 mg via INTRAVENOUS
  Filled 2019-08-04 (×6): qty 4

## 2019-08-04 MED ORDER — ATORVASTATIN CALCIUM 80 MG PO TABS
80.0000 mg | ORAL_TABLET | Freq: Every day | ORAL | Status: DC
  Administered 2019-08-04 – 2019-08-09 (×6): 80 mg via ORAL
  Filled 2019-08-04 (×6): qty 1

## 2019-08-04 MED ORDER — MAGNESIUM SULFATE 2 GM/50ML IV SOLN
2.0000 g | Freq: Once | INTRAVENOUS | Status: AC
  Administered 2019-08-04: 2 g via INTRAVENOUS
  Filled 2019-08-04: qty 50

## 2019-08-04 MED ORDER — FUROSEMIDE 10 MG/ML IJ SOLN
80.0000 mg | Freq: Once | INTRAMUSCULAR | Status: AC
  Administered 2019-08-04: 19:00:00 80 mg via INTRAVENOUS
  Filled 2019-08-04: qty 8

## 2019-08-04 MED ORDER — DORZOLAMIDE HCL-TIMOLOL MAL 2-0.5 % OP SOLN
1.0000 [drp] | Freq: Two times a day (BID) | OPHTHALMIC | Status: DC
  Administered 2019-08-04 – 2019-08-10 (×12): 1 [drp] via OPHTHALMIC
  Filled 2019-08-04: qty 10

## 2019-08-04 MED ORDER — METOPROLOL TARTRATE 12.5 MG HALF TABLET
12.5000 mg | ORAL_TABLET | Freq: Two times a day (BID) | ORAL | Status: DC
  Administered 2019-08-04 – 2019-08-10 (×12): 12.5 mg via ORAL
  Filled 2019-08-04 (×12): qty 1

## 2019-08-04 MED ORDER — SODIUM BICARBONATE 650 MG PO TABS
650.0000 mg | ORAL_TABLET | Freq: Two times a day (BID) | ORAL | Status: DC
  Administered 2019-08-04 – 2019-08-10 (×12): 650 mg via ORAL
  Filled 2019-08-04 (×12): qty 1

## 2019-08-04 NOTE — Progress Notes (Signed)
   CC: edema  HPI:  [name] is a 69 y.o. male who presents for progressive edema. Please see problem based assessment and plan for additional details.     Past Medical History:  Diagnosis Date  . Anemia   . Arthritis    past hx   . Blindness    right eye  . Cataract    removed both eyes  . Dehydration   . Diabetes ([place])   . Glaucoma   . History of CVA (cerebrovascular accident) 09/13/2015  . History of urinary retention   . Hyperlipidemia   . Hypertension   . Pernicious anemia 02/24/2018  . Stroke [facility])    2017- March  . Tachycardia 08/26/2017  . Tubular adenoma of colon 02/2017  . Weight loss, non-intentional 08/26/2017   10 lbs between 6/18 & 2/19    Review of Systems:  Review of Systems - General ROS: negative for - chills or fever Respiratory ROS: no cough, shortness of breath, or wheezing Cardiovascular ROS: no chest pain or dyspnea on exertion Genito-Urinary ROS: positive for - oliguria negative for - pelvic pain Dermatological ROS: positive for stasis dermatitis negative for rash   Physical Exam:  Vitals:   08/04/19 1553  BP: 128/69  Pulse: 64  Temp: 98.3 F (36.8 C)  TempSrc: Oral  SpO2: 100%  Weight: 193 lb (87.5 kg)  Height: 6' (1.829 m)    GENERAL: well appearing, in no apparent distress CARDIAC: heart regular rate and rhythm. No JVD. +3 edema to the extremities. RUE>LUE. Fluid weeping from arms. Right radial pulse difficult to palpate however was auscultated on doppler.  PULMONARY: lung sounds clear to auscultation  ABDOMEN: bowel sounds active. Nontender. Edematous. SKIN: stasis dermatitis on LE bilaterally.   Assessment & Plan:   1. Edema/Anasarca secondary to renal failure. Spoke with the daughter earlier today regarding her concerns with progressive edema/weeping since hospital discharge on 1/23. During that hospitalization, a foley catheter was placed for urinary retention. Since hospital discharge, he has had oliguria--approx  100-150cc/day. Denies orthopnea, dyspnea, or chest discomfort.  Upon chart review, he appears he had received about 2L of IVF during that hospitalization and has limited his fluid intake since discharge.  +21 lbs since 07/21/19. PE significant for +3 edema to the bilateral upper and lower extremities with weepage. RUE>LUE. I was unable to palpate a pulse on his RUE however did auscultate it on doppler.  Abdomen feels firm. Lungs are clear to auscultation.  Assessment: likely a progressive of his renal failure coupled with IVF he received during his hospitalization. I would consider outpatient diuresis however given his renal function, I think he will require closer monitoring than can be afforded in that setting.  Plan: will admit directly to my colleagues on the internal medicine teaching service to allow for close monitoring during aggressive diuresis. Will allow them to obtain labs today since he will be heading to the floor shortly.   Patient is in agreement with the plan and endorses no further questions at this time.  Patient seen with Dr. Marylyn Ishihara, MD Internal Medicine Resident-PGY1 08/04/19

## 2019-08-04 NOTE — H&P (Signed)
Date: 08/04/2019               Patient Name:  Dennis Macias MRN: [id number]  DOB: 09/08/1950 Age / Sex: 69 y.o., male   PCP: Dennis Hansen, MD         Medical Service: Internal Medicine Teaching Service         Attending Physician: Dr. Heber Pakala Macias, Dennis Moulds, DO    First Contact: Dennis Coil, MD, Dennis Macias: Dennis Macias)  Second Contact: Dennis Stanford, MD, Dennis Macias: Dennis Macias)       After Hours (After 5p/  First Contact Macias: [phone]  weekends / holidays): Second Contact Macias: [phone]   Chief Complaint: Decreased urine output, anasarca  History of Present Illness: [name] is a pleasant AA gentleman with PMHx of CKD stage IV, hypertension, type 2 diabetes mellitus, CVA, hyperlipidemia who presents as a direct admit from the outpatient clinic accompanied by his wife due to concerns for decreased urine output as well as upper and lower extremity edema. He denies orthopnea, chest pain, nausea, vomiting, shortness of breath, nausea, vomiting, abdominal pain, metallic taste in mouth, itching.  Of note, the patient was recently admitted to the hospital from 07/31/2019-08/01/2019 after a mechanical fall. He was evaluated by PT who recommended Home health PT. During his hospitalization he was found to have urinary retention and a foley catheter was placed. Given his prior history of possible BPH vs prostate disease, several months of weight loss and decline in function an urgent clinic appointment was made to Urology.   Meds:  Medications Prior to Admission  Medication Sig Dispense Refill  . ACCU-CHEK FASTCLIX LANCETS MISC Check blood sugar up to 7 times a week as instructed 102 each 3  . amLODipine (NORVASC) 10 MG tablet Take 1 tablet (10 mg total) by mouth daily. 90 tablet 3  . aspirin 81 MG tablet Take 1 tablet (81 mg total) by mouth daily. Internal Medicine Program place on hold until patient requests to fill 90 tablet 3  . atorvastatin (LIPITOR) 80 MG tablet Take 1 tablet (80 mg  total) by mouth at bedtime. IM program 90 tablet 3  . Blood Glucose Monitoring Suppl (ACCU-CHEK GUIDE) w/Device KIT 1 each by Does not apply route daily. Check blood sugar as instructed up to 7 times a week 1 kit 0  . dorzolamide-timolol (COSOPT) 22.3-6.8 MG/ML ophthalmic solution Place 1 drop into the right eye 2 (two) times daily.   10  . glucose blood (ACCU-CHEK GUIDE) test strip Check blood sugar up to 7 times a week as instructed 100 each 3  . metoprolol tartrate (LOPRESSOR) 25 MG tablet Take 0.5 tablets (12.5 mg total) by mouth 2 (two) times daily. 30 tablet 5  . sodium bicarbonate 650 MG tablet Take 650 mg by mouth 2 (two) times daily.    . tamsulosin (FLOMAX) 0.4 MG CAPS capsule Take 1 capsule (0.4 mg total) by mouth daily after supper. 30 capsule 0  . vitamin B-12 (CYANOCOBALAMIN) 1000 MCG tablet Take 1,000 mcg by mouth daily.     Allergies: Allergies as of 08/04/2019  . (No Known Allergies)   Past Medical History:  Diagnosis Date  . Anemia   . Arthritis    past hx   . Blindness    right eye  . Cataract    removed both eyes  . Dehydration   . Diabetes ([place])   . Glaucoma   . History of CVA (cerebrovascular accident) 09/13/2015  . History of urinary retention   .  Hyperlipidemia   . Hypertension   . Pernicious anemia 02/24/2018  . Stroke [facility])    2017- March  . Tachycardia 08/26/2017  . Tubular adenoma of colon 02/2017  . Weight loss, non-intentional 08/26/2017   10 lbs between 6/18 & 2/19   Family History:  -Mother with hypertension -Father with hyperlipidemia  Social History:  -He used to drink heavily in the past however has abstained from using alcohol in the past 2 to 3 years.  He quit smoking around the same time  Review of Systems: A complete ROS was negative except as per HPI.   Physical Exam: Blood pressure (!) 142/69, pulse (!) 59, temperature 98 F (36.7 C), temperature source Oral, resp. rate 19, height 6' (1.829 m), weight 83.6 kg, SpO2 100 %. Physical  Exam  Constitutional: He is oriented to person, place, and time and well-developed, well-nourished, and in no distress. No distress.  HENT:  Head: Normocephalic and atraumatic.  Eyes: Right eye exhibits no discharge.  Cardiovascular: Normal rate, S1 normal, S2 normal, normal heart sounds and normal pulses. An irregular rhythm present. Exam reveals no gallop, no S3, no S4, no distant heart sounds and no friction rub.  No murmur heard.  No systolic murmur is present.  No diastolic murmur is present. Pulmonary/Chest: Effort normal and breath sounds normal. No respiratory distress. He has no wheezes.  Abdominal: Soft. Bowel sounds are normal. He exhibits no distension. There is no abdominal tenderness.  Musculoskeletal:        General: Edema (2+ Edema (RUE, RLE, LUE, LLE)) present. No tenderness or deformity.     Cervical back: Normal range of motion and neck supple.  Neurological: He is alert and oriented to person, place, and time.  Skin: He is not diaphoretic.  Psychiatric: Mood and affect normal.   EKG: Pending  CXR: Pending  Assessment & Plan by Problem: Active Problems:   Anasarca  In summary, [name] is a pleasant AA gentleman with PMHx of CKD stage IV, hypertension, type 2 diabetes mellitus, CVA, hyperlipidemia here with anasarca and decreased urine output concerning for nephrotic syndrome and worsening kidney function.  #Anasarca #CKD stage IV: Baseline Cr  Roughly 3.4. Pt denies any diffuse itching, confusion, metallic taste in his mouth or other signs of uremia at this time.  -IV Lasix 80 mg once followed by 40 mg BID -Daily weights -Strict I's and O's -Renal function panel ordered -Urine protein/Cr ratio ordered -Magnesium level ordered  #Irregular heart rhythm: Appreciated on cardiac exam. [name] was previously in sinus rhythm prior to discharge at his last hospitalization a few days ago. -EKG ordered -Telemetry  #Decreased Urine Output  #Hx hematuria #Hx  Elevated PSA: PSA elevated to 4.7 in March 2019 and subsequently 4.34 in June 2019. Most recently 3.4 as of 1/23.  Patient was supposed to continue following up with urology to evaluate the need for prostate biopsy, however patient never had this done. Patient had a CT of the abdomen and pelvis in 2019 which did not demonstrate any blastic or lytic lesions. Patient does have history of hematuria and extensive smoking history.  High concern for malignancy, especially in the context of progressive weakness, decreased appetite and 30 pound weight loss over the last several months. -Patient will need urgent follow-up with urology in the outpatient setting once discharged from the hospital -urine protein/cr ratio ordered, as noted above  #Hx CVA #HTN #HLD -Metoprolol 12.5 mg twice daily -Atorvastatin 80 mg daily -Aspirin 81 mg daily  #  Hx BL cataracts s/p extraction #Hx Glaucoma #R eye vision loss -Cosopt ophthalmic solution 1 drop each eye BID  #T2DM -SSI  #FEN/GI -Diet: Renal, 1200 cc fluid restriction -Fluids: none   #DVT prophylaxis -Heparin 5000 units subq every 8 hours  #CODE STATUS: FULL  Dispo: Admit patient to Observation with expected length of stay less than 2 midnights.  Prior to Admission Living Arrangement: Home with home health PT Anticipated Discharge Location: Home  Barriers to Discharge: Ongoing medical work-up.  Earlene Plater, MD Internal Medicine, PGY1 Mac[place]: [phone]  08/04/2019,6:28 PM

## 2019-08-04 NOTE — Progress Notes (Signed)
Report called to Dian[place] on [address].  Patient is alert and oriented.  Saline lock in right hand intact.  Foley catheter is draining urine. Transported via wheel chair with family member in attendance.  Sander Nephew, RN 08/04/2019 5:22 PM.

## 2019-08-04 NOTE — Assessment & Plan Note (Signed)
Edema/Anasarca secondary to renal failure. Spoke with the daughter earlier today regarding her concerns with progressive edema/weeping since hospital discharge on 1/23. During that hospitalization, a foley catheter was placed for urinary retention. Since hospital discharge, he has had oliguria--approx 100-150cc/day. Denies orthopnea, dyspnea, or chest discomfort.  Upon chart review, he appears he had received about 2L of IVF during that hospitalization and has limited his fluid intake since discharge.  +21 lbs since 07/21/19. PE significant for +3 edema to the bilateral upper and lower extremities with weepage. RUE>LUE. I was unable to palpate a pulse on his RUE however did auscultate it on doppler.  Abdomen feels firm. Lungs are clear to auscultation.  Assessment: likely a progressive of his renal failure coupled with IVF he received during his hospitalization. I would consider outpatient diuresis however given his renal function, I think he will require closer monitoring than can be afforded in that setting.  Plan: will admit directly to my colleagues on the internal medicine teaching service to allow for close monitoring during aggressive diuresis. Will allow them to obtain labs today since he will be heading to the floor shortly.

## 2019-08-04 NOTE — Telephone Encounter (Signed)
Spoke with Dennis Macias's daughter. She notes that Dennis Macias's forearm was swollen and indurated in the hospital however has significantly progressed since discharge and is now weeping clear fluid. Additionally, in contralateral hand is beginning to swell. Arranged for patient to be seen in my clinic this afternoon at 1545 at which time further evaluation can take place.

## 2019-08-04 NOTE — Progress Notes (Signed)
Orthopedic Tech Progress Note Patient Details:  Dennis Macias Feb 16, 1951 [id number]  Ortho Devices Type of Ortho Device: Haematologist Ortho Device/Splint Location: Lower Bilateral Ortho Device/Splint Interventions: Ordered, Application   Post Interventions Patient Tolerated: Well Instructions Provided: Care of device   Staci Righter 08/04/2019, 8:23 PM

## 2019-08-04 NOTE — Addendum Note (Signed)
Addended by: Jean Rosenthal on: 08/04/2019 05:35 PM   Modules accepted: Orders, SmartSet

## 2019-08-05 ENCOUNTER — Inpatient Hospital Stay (HOSPITAL_COMMUNITY): Payer: Medicare PPO

## 2019-08-05 ENCOUNTER — Other Ambulatory Visit: Payer: Self-pay

## 2019-08-05 DIAGNOSIS — Z23 Encounter for immunization: Secondary | ICD-10-CM | POA: Diagnosis present

## 2019-08-05 DIAGNOSIS — Z8673 Personal history of transient ischemic attack (TIA), and cerebral infarction without residual deficits: Secondary | ICD-10-CM | POA: Diagnosis not present

## 2019-08-05 DIAGNOSIS — E1122 Type 2 diabetes mellitus with diabetic chronic kidney disease: Secondary | ICD-10-CM | POA: Diagnosis present

## 2019-08-05 DIAGNOSIS — Z9842 Cataract extraction status, left eye: Secondary | ICD-10-CM

## 2019-08-05 DIAGNOSIS — R338 Other retention of urine: Secondary | ICD-10-CM | POA: Diagnosis present

## 2019-08-05 DIAGNOSIS — X58XXXA Exposure to other specified factors, initial encounter: Secondary | ICD-10-CM | POA: Diagnosis present

## 2019-08-05 DIAGNOSIS — E441 Mild protein-calorie malnutrition: Secondary | ICD-10-CM | POA: Diagnosis present

## 2019-08-05 DIAGNOSIS — H409 Unspecified glaucoma: Secondary | ICD-10-CM | POA: Diagnosis present

## 2019-08-05 DIAGNOSIS — N138 Other obstructive and reflux uropathy: Secondary | ICD-10-CM | POA: Diagnosis present

## 2019-08-05 DIAGNOSIS — H5461 Unqualified visual loss, right eye, normal vision left eye: Secondary | ICD-10-CM | POA: Diagnosis present

## 2019-08-05 DIAGNOSIS — Z87891 Personal history of nicotine dependence: Secondary | ICD-10-CM | POA: Diagnosis not present

## 2019-08-05 DIAGNOSIS — E785 Hyperlipidemia, unspecified: Secondary | ICD-10-CM

## 2019-08-05 DIAGNOSIS — H42 Glaucoma in diseases classified elsewhere: Secondary | ICD-10-CM

## 2019-08-05 DIAGNOSIS — N400 Enlarged prostate without lower urinary tract symptoms: Secondary | ICD-10-CM | POA: Diagnosis present

## 2019-08-05 DIAGNOSIS — I493 Ventricular premature depolarization: Secondary | ICD-10-CM | POA: Diagnosis present

## 2019-08-05 DIAGNOSIS — I129 Hypertensive chronic kidney disease with stage 1 through stage 4 chronic kidney disease, or unspecified chronic kidney disease: Principal | ICD-10-CM

## 2019-08-05 DIAGNOSIS — N049 Nephrotic syndrome with unspecified morphologic changes: Secondary | ICD-10-CM | POA: Diagnosis not present

## 2019-08-05 DIAGNOSIS — R39198 Other difficulties with micturition: Secondary | ICD-10-CM

## 2019-08-05 DIAGNOSIS — C642 Malignant neoplasm of left kidney, except renal pelvis: Secondary | ICD-10-CM | POA: Diagnosis present

## 2019-08-05 DIAGNOSIS — E1139 Type 2 diabetes mellitus with other diabetic ophthalmic complication: Secondary | ICD-10-CM

## 2019-08-05 DIAGNOSIS — Z79899 Other long term (current) drug therapy: Secondary | ICD-10-CM

## 2019-08-05 DIAGNOSIS — N184 Chronic kidney disease, stage 4 (severe): Secondary | ICD-10-CM | POA: Diagnosis present

## 2019-08-05 DIAGNOSIS — Z20822 Contact with and (suspected) exposure to covid-19: Secondary | ICD-10-CM | POA: Diagnosis present

## 2019-08-05 DIAGNOSIS — Z9841 Cataract extraction status, right eye: Secondary | ICD-10-CM

## 2019-08-05 DIAGNOSIS — N401 Enlarged prostate with lower urinary tract symptoms: Secondary | ICD-10-CM | POA: Diagnosis present

## 2019-08-05 DIAGNOSIS — R601 Generalized edema: Secondary | ICD-10-CM | POA: Diagnosis not present

## 2019-08-05 DIAGNOSIS — Z7982 Long term (current) use of aspirin: Secondary | ICD-10-CM

## 2019-08-05 DIAGNOSIS — D649 Anemia, unspecified: Secondary | ICD-10-CM | POA: Diagnosis present

## 2019-08-05 DIAGNOSIS — S41111A Laceration without foreign body of right upper arm, initial encounter: Secondary | ICD-10-CM | POA: Diagnosis present

## 2019-08-05 DIAGNOSIS — S41112A Laceration without foreign body of left upper arm, initial encounter: Secondary | ICD-10-CM | POA: Diagnosis present

## 2019-08-05 DIAGNOSIS — R600 Localized edema: Secondary | ICD-10-CM | POA: Diagnosis present

## 2019-08-05 DIAGNOSIS — I499 Cardiac arrhythmia, unspecified: Secondary | ICD-10-CM

## 2019-08-05 DIAGNOSIS — Z9181 History of falling: Secondary | ICD-10-CM | POA: Diagnosis not present

## 2019-08-05 DIAGNOSIS — N2889 Other specified disorders of kidney and ureter: Secondary | ICD-10-CM

## 2019-08-05 DIAGNOSIS — Z6824 Body mass index (BMI) 24.0-24.9, adult: Secondary | ICD-10-CM | POA: Diagnosis not present

## 2019-08-05 LAB — URINALYSIS, ROUTINE W REFLEX MICROSCOPIC
Bilirubin Urine: NEGATIVE
Glucose, UA: NEGATIVE mg/dL
Ketones, ur: NEGATIVE mg/dL
Leukocytes,Ua: NEGATIVE
Nitrite: NEGATIVE
Protein, ur: 100 mg/dL — AB
Specific Gravity, Urine: 1.009 (ref 1.005–1.030)
pH: 5 (ref 5.0–8.0)

## 2019-08-05 LAB — GLUCOSE, CAPILLARY
Glucose-Capillary: 90 mg/dL (ref 70–99)
Glucose-Capillary: 120 mg/dL — ABNORMAL HIGH (ref 70–99)
Glucose-Capillary: 131 mg/dL — ABNORMAL HIGH (ref 70–99)
Glucose-Capillary: 98 mg/dL (ref 70–99)

## 2019-08-05 LAB — RENAL FUNCTION PANEL
Albumin: 1.9 g/dL — ABNORMAL LOW (ref 3.5–5.0)
Anion gap: 7 (ref 5–15)
BUN: 29 mg/dL — ABNORMAL HIGH (ref 8–23)
CO2: 22 mmol/L (ref 22–32)
Calcium: 8.4 mg/dL — ABNORMAL LOW (ref 8.9–10.3)
Chloride: 112 mmol/L — ABNORMAL HIGH (ref 98–111)
Creatinine, Ser: 3.02 mg/dL — ABNORMAL HIGH (ref 0.61–1.24)
GFR calc Af Amer: 23 mL/min — ABNORMAL LOW (ref >60)
GFR calc non Af Amer: 20 mL/min — ABNORMAL LOW (ref >60)
Glucose, Bld: 106 mg/dL — ABNORMAL HIGH (ref 70–99)
Phosphorus: 4.1 mg/dL (ref 2.5–4.6)
Potassium: 4.6 mmol/L (ref 3.5–5.1)
Sodium: 141 mmol/L (ref 135–145)

## 2019-08-05 LAB — CBC
HCT: 24.1 % — ABNORMAL LOW (ref 39.0–52.0)
Hemoglobin: 8 g/dL — ABNORMAL LOW (ref 13.0–17.0)
MCH: 30.3 pg (ref 26.0–34.0)
MCHC: 33.2 g/dL (ref 30.0–36.0)
MCV: 91.3 fL (ref 80.0–100.0)
Platelets: 282 10*3/uL (ref 150–400)
RBC: 2.64 MIL/uL — ABNORMAL LOW (ref 4.22–5.81)
RDW: 14.1 % (ref 11.5–15.5)
WBC: 5.4 10*3/uL (ref 4.0–10.5)
nRBC: 0 % (ref 0.0–0.2)

## 2019-08-05 LAB — SARS CORONAVIRUS 2 (TAT 6-24 HRS): SARS Coronavirus 2: NEGATIVE

## 2019-08-05 MED ORDER — AMLODIPINE BESYLATE 10 MG PO TABS
10.0000 mg | ORAL_TABLET | Freq: Every day | ORAL | Status: DC
  Administered 2019-08-05 – 2019-08-10 (×6): 10 mg via ORAL
  Filled 2019-08-05 (×6): qty 1

## 2019-08-05 MED ORDER — INFLUENZA VAC A&B SA ADJ QUAD 0.5 ML IM PRSY
0.5000 mL | PREFILLED_SYRINGE | INTRAMUSCULAR | Status: AC
  Administered 2019-08-09: 0.5 mL via INTRAMUSCULAR
  Filled 2019-08-05 (×2): qty 0.5

## 2019-08-05 MED ORDER — MAGNESIUM SULFATE 2 GM/50ML IV SOLN
2.0000 g | Freq: Once | INTRAVENOUS | Status: AC
  Administered 2019-08-05: 2 g via INTRAVENOUS
  Filled 2019-08-05: qty 50

## 2019-08-05 MED ORDER — CHLORHEXIDINE GLUCONATE CLOTH 2 % EX PADS
6.0000 | MEDICATED_PAD | Freq: Every day | CUTANEOUS | Status: DC
  Administered 2019-08-05 – 2019-08-09 (×5): 6 via TOPICAL

## 2019-08-05 MED ORDER — PNEUMOCOCCAL VAC POLYVALENT 25 MCG/0.5ML IJ INJ
0.5000 mL | INJECTION | INTRAMUSCULAR | Status: AC
  Administered 2019-08-08: 0.5 mL via INTRAMUSCULAR
  Filled 2019-08-05: qty 0.5

## 2019-08-05 NOTE — Progress Notes (Signed)
Subjective:   Pt was seen at the bedside today.  He states he is feeling well this morning.  He still feels diffusely swollen, but states that he has been peeing a lot over the last day.  We discussed getting further imaging to look for suspected malignancy and patient understands.  No complaints at this time and all concerns were addressed.  Objective: CBC Latest Ref Rng & Units 08/05/2019 08/01/2019 07/31/2019  WBC 4.0 - 10.5 K/uL 5.4 6.1 7.8  Hemoglobin 13.0 - 17.0 g/dL 8.0(L) 8.1(L) 9.9(L)  Hematocrit 39.0 - 52.0 % 24.1(L) 24.4(L) 30.8(L)  Platelets 150 - 400 K/uL 282 291 340   BMP Latest Ref Rng & Units 08/05/2019 08/04/2019 08/01/2019  Glucose 70 - 99 mg/dL 106(H) 130(H) 103(H)  BUN 8 - 23 mg/dL 29(H) 28(H) 25(H)  Creatinine 0.61 - 1.24 mg/dL 3.02(H) 3.06(H) 3.34(H)  BUN/Creat Ratio 10 - 24 - - -  Sodium 135 - 145 mmol/L 141 141 142  Potassium 3.5 - 5.1 mmol/L 4.6 5.3(H) 4.2  Chloride 98 - 111 mmol/L 112(H) 115(H) 115(H)  CO2 22 - 32 mmol/L 22 18(L) 19(L)  Calcium 8.9 - 10.3 mg/dL 8.4(L) 8.3(L) 8.1(L)   Vital signs in last 24 hours: Vitals:   08/04/19 2057 08/05/19 0005 08/05/19 0439 08/05/19 0623  BP: (!) 154/81 (!) 156/77 140/77   Pulse: 76 62 60   Resp: 18 16 18    Temp: 97.9 F (36.6 C) 98.1 F (36.7 C) 98.2 F (36.8 C)   TempSrc: Oral Oral Oral   SpO2: 100% 99% 100%   Weight:    82.9 kg  Height:       Physical Exam General: Comparably sitting up in bed watching TV HEENT: NCAT CV: RRR, normal S1-S2 no murmurs rubs or gallops appreciated Extremities: 2+ pitting edema in bilateral legs and arms PULM: Clear to auscultation bilaterally, no crackles or wheezes appreciated ABD: Soft and nontender in all quadrants NEURO: Alert and oriented, nonfocal  Assessment/Plan:  Active Problems:   Anasarca  In summary, Dennis Macias is a pleasant AA gentleman with PMHx of CKD stage IV, hypertension, type 2 diabetes mellitus, CVA, hyperlipidemia here with anasarca and decreased  urine output concerning for nephrotic syndrome and worsening kidney function.  #Anasarca #CKD stage IV: Baseline Cr  Roughly 3.4. Continues to be at baseline today. Pt denies any diffuse itching, confusion, metallic taste in his mouth or other signs of uremia at this time. Urine protein/Cr elevated to 1.93.  -F/u UA -IV Lasix 40 mg BID -Daily weights -Strict I's and O's -Daily renal function panel ordered  #Irregular heart rhythm:  Appreciated on cardiac exam on day of admission. EKG noted occasional PVCs -Telemetry -Continue to monitor   #Decreased Urine Output  #Hx hematuria #Kidney mass #Hx Elevated PSA: PSA elevated to 4.7 in March 2019 and subsequently 4.34 in June 2019. Most recently 3.4 as of 1/23.  Patient was supposed to continue following up with urology to evaluate the need for prostate biopsy, however patient never had this done. Patient had a CT of the abdomen and pelvis in 2019 which did not demonstrate any blastic or lytic lesions. Patient does have history of hematuria and extensive smoking history.  High concern for malignancy, especially in the context of progressive weakness, decreased appetite and 30 pound weight loss over the last several months. Renal u/s from 1/5 demonstrated L kidney mass. -Patient will need urgent follow-up with urology in the outpatient setting once discharged from the hospital -MRI abdomen and  pelvis w/o ordered (discussed best imaging modality with Radiology, given hx of CKD and inability to use contrast)  #Hx CVA #HTN #HLD -Metoprolol 12.5 mg twice daily -Atorvastatin 80 mg daily -Aspirin 81 mg daily  #Hx BL cataracts s/p extraction #Hx Glaucoma #R eye vision loss -Cosopt ophthalmic solution 1 drop each eye BID  #T2DM -SSI  #FEN/GI -Diet: Renal, 1200 cc fluid restriction -Fluids: none  #DVT prophylaxis -Heparin 5000 units subq q8hrs  #CODE STATUS: FULL  #Dispo Prior to Admission Living Arrangement: Home w/ [facility]  PT Anticipated Discharge Location: Home w/ HH PT Barriers to Discharge: Ongoing medical workup  Earlene Plater, MD Internal Medicine, PGY1 Pager: [phone]  08/05/2019,1:48 PM

## 2019-08-06 LAB — RENAL FUNCTION PANEL
Albumin: 1.6 g/dL — ABNORMAL LOW (ref 3.5–5.0)
Anion gap: 11 (ref 5–15)
BUN: 29 mg/dL — ABNORMAL HIGH (ref 8–23)
CO2: 21 mmol/L — ABNORMAL LOW (ref 22–32)
Calcium: 8.4 mg/dL — ABNORMAL LOW (ref 8.9–10.3)
Chloride: 110 mmol/L (ref 98–111)
Creatinine, Ser: 2.96 mg/dL — ABNORMAL HIGH (ref 0.61–1.24)
GFR calc Af Amer: 24 mL/min — ABNORMAL LOW (ref >60)
GFR calc non Af Amer: 21 mL/min — ABNORMAL LOW (ref >60)
Glucose, Bld: 101 mg/dL — ABNORMAL HIGH (ref 70–99)
Phosphorus: 4.1 mg/dL (ref 2.5–4.6)
Potassium: 4.6 mmol/L (ref 3.5–5.1)
Sodium: 142 mmol/L (ref 135–145)

## 2019-08-06 LAB — GLUCOSE, CAPILLARY
Glucose-Capillary: 87 mg/dL (ref 70–99)
Glucose-Capillary: 97 mg/dL (ref 70–99)
Glucose-Capillary: 120 mg/dL — ABNORMAL HIGH (ref 70–99)

## 2019-08-06 LAB — CBC
HCT: 23 % — ABNORMAL LOW (ref 39.0–52.0)
Hemoglobin: 7.7 g/dL — ABNORMAL LOW (ref 13.0–17.0)
MCH: 30.4 pg (ref 26.0–34.0)
MCHC: 33.5 g/dL (ref 30.0–36.0)
MCV: 90.9 fL (ref 80.0–100.0)
Platelets: 268 10*3/uL (ref 150–400)
RBC: 2.53 MIL/uL — ABNORMAL LOW (ref 4.22–5.81)
RDW: 14.2 % (ref 11.5–15.5)
WBC: 5 10*3/uL (ref 4.0–10.5)
nRBC: 0 % (ref 0.0–0.2)

## 2019-08-06 LAB — HEPATIC FUNCTION PANEL
ALT: 12 U/L (ref 0–44)
AST: 15 U/L (ref 15–41)
Albumin: 2 g/dL — ABNORMAL LOW (ref 3.5–5.0)
Alkaline Phosphatase: 62 U/L (ref 38–126)
Bilirubin, Direct: <0.1 mg/dL (ref 0.0–0.2)
Total Bilirubin: 0.7 mg/dL (ref 0.3–1.2)
Total Protein: 5 g/dL — ABNORMAL LOW (ref 6.5–8.1)

## 2019-08-06 LAB — FERRITIN: Ferritin: 273 ng/mL (ref 24–336)

## 2019-08-06 LAB — MAGNESIUM: Magnesium: 2.2 mg/dL (ref 1.7–2.4)

## 2019-08-06 LAB — IRON AND TIBC
Iron: 65 ug/dL (ref 45–182)
Saturation Ratios: 41 % — ABNORMAL HIGH (ref 17.9–39.5)
TIBC: 158 ug/dL — ABNORMAL LOW (ref 250–450)
UIBC: 93 ug/dL

## 2019-08-06 NOTE — Progress Notes (Addendum)
Subjective:   Pt was seen at the bedside today.  Patient was sleeping upon entering the room.  States he does not have any complaints at this time.  Objective: CBC Latest Ref Rng & Units 08/06/2019 08/05/2019 08/01/2019  WBC 4.0 - 10.5 K/uL 5.0 5.4 6.1  Hemoglobin 13.0 - 17.0 g/dL 7.7(L) 8.0(L) 8.1(L)  Hematocrit 39.0 - 52.0 % 23.0(L) 24.1(L) 24.4(L)  Platelets 150 - 400 K/uL 268 282 291   BMP Latest Ref Rng & Units 08/06/2019 08/05/2019 08/04/2019  Glucose 70 - 99 mg/dL 101(H) 106(H) 130(H)  BUN 8 - 23 mg/dL 29(H) 29(H) 28(H)  Creatinine 0.61 - 1.24 mg/dL 2.96(H) 3.02(H) 3.06(H)  BUN/Creat Ratio 10 - 24 - - -  Sodium 135 - 145 mmol/L 142 141 141  Potassium 3.5 - 5.1 mmol/L 4.6 4.6 5.3(H)  Chloride 98 - 111 mmol/L 110 112(H) 115(H)  CO2 22 - 32 mmol/L 21(L) 22 18(L)  Calcium 8.9 - 10.3 mg/dL 8.4(L) 8.4(L) 8.3(L)   Vital signs in last 24 hours: Vitals:   08/05/19 2200 08/06/19 0033 08/06/19 0518 08/06/19 0521  BP: (!) 147/80 (!) 150/77  (!) 148/73  Pulse:  65  61  Resp:  17  17  Temp:  98.2 F (36.8 C)  98.5 F (36.9 C)  TempSrc:  Oral  Oral  SpO2:  98%  98%  Weight:   82.7 kg   Height:       Physical Exam General: Comparably sitting up in bed watching TV HEENT: NCAT CV: RRR, normal S1-S2 no murmurs rubs or gallops appreciated Extremities: 1+ pitting edema in bilateral legs and arms, improved form day prior PULM: Clear to auscultation bilaterally, no crackles or wheezes appreciated ABD: Soft and nontender in all quadrants NEURO: Alert and oriented, nonfocal  Assessment/Plan:  Principal Problem:   Anasarca associated with disorder of kidney Active Problems:   Essential hypertension   Normocytic anemia   BPH with obstruction/lower urinary tract symptoms   CKD (chronic kidney disease) stage 4, GFR 15-29 ml/min (HCC)   Left renal mass   Anasarca   Enlarged prostate  In summary, Dennis Macias is a pleasant AA gentleman with PMHx of CKD stage IV, hypertension, type 2  diabetes mellitus, CVA, hyperlipidemia here with anasarca and decreased urine output concerning for nephrotic syndrome and worsening kidney function.  #Anasarca #CKD stage IV: Baseline Cr  Roughly 3.4. Continues to be at baseline today. Urine protein/Cr elevated to 1.93. UA demonstrates some proteinuria  -Continue IV Lasix 40 mg BID -Daily weights -Strict I's and O's -Daily renal function panel ordered  #Decreased Urine Output  #Hx hematuria #Kidney mass #Hx Elevated PSA: PSA elevated to 4.7 in March 2019 and subsequently 4.34 in June 2019. Most recently 3.4 as of 1/23.  Patient was supposed to continue following up with urology to evaluate the need for prostate biopsy, however patient never had this done. Patient had a CT of the abdomen and pelvis in 2019 which did not demonstrate any blastic or lytic lesions. Patient does have history of hematuria and extensive smoking history. High concern for malignancy, especially in the context of progressive weakness, decreased appetite and 30 pound weight loss over the last several months. Renal u/s from 1/5 demonstrated L kidney mass. -MRI abdomen and pelvis performed on 1/27 was limited by motion artifact and diffuse anasarca. Recommend repeat imaging within 3 months after anasarca is resolved. However, hepatic and splenic iron deposition reported. -Liver function and iron studies ordered -Patient will need urgent follow-up  with urology in the outpatient setting once discharged from the hospital  #Hx CVA #HTN #HLD -Metoprolol 12.5 mg twice daily -Atorvastatin 80 mg daily -Aspirin 81 mg daily  #Hx BL cataracts s/p extraction #Hx Glaucoma #R eye vision loss -Cosopt ophthalmic solution 1 drop each eye BID  #T2DM -SSI  #FEN/GI -Diet: Renal, 1200 cc fluid restriction -Fluids: none  #DVT prophylaxis -Heparin 5000 units subq q8hrs  #CODE STATUS: FULL  #Dispo Prior to Admission Living Arrangement: Home w/ [facility] PT Anticipated Discharge  Location: Home w/ HH PT Barriers to Discharge: Ongoing medical workup  Dennis Plater, MD Internal Medicine, PGY1 Pager: [phone]  08/06/2019,9:58 AM

## 2019-08-07 LAB — RENAL FUNCTION PANEL
Albumin: 1.7 g/dL — ABNORMAL LOW (ref 3.5–5.0)
Anion gap: 7 (ref 5–15)
BUN: 33 mg/dL — ABNORMAL HIGH (ref 8–23)
CO2: 21 mmol/L — ABNORMAL LOW (ref 22–32)
Calcium: 8.2 mg/dL — ABNORMAL LOW (ref 8.9–10.3)
Chloride: 112 mmol/L — ABNORMAL HIGH (ref 98–111)
Creatinine, Ser: 2.98 mg/dL — ABNORMAL HIGH (ref 0.61–1.24)
GFR calc Af Amer: 24 mL/min — ABNORMAL LOW (ref >60)
GFR calc non Af Amer: 21 mL/min — ABNORMAL LOW (ref >60)
Glucose, Bld: 86 mg/dL (ref 70–99)
Phosphorus: 4.1 mg/dL (ref 2.5–4.6)
Potassium: 4.7 mmol/L (ref 3.5–5.1)
Sodium: 140 mmol/L (ref 135–145)

## 2019-08-07 LAB — CBC
HCT: 23.7 % — ABNORMAL LOW (ref 39.0–52.0)
Hemoglobin: 7.8 g/dL — ABNORMAL LOW (ref 13.0–17.0)
MCH: 30.2 pg (ref 26.0–34.0)
MCHC: 32.9 g/dL (ref 30.0–36.0)
MCV: 91.9 fL (ref 80.0–100.0)
Platelets: 284 10*3/uL (ref 150–400)
RBC: 2.58 MIL/uL — ABNORMAL LOW (ref 4.22–5.81)
RDW: 14.1 % (ref 11.5–15.5)
WBC: 5.5 10*3/uL (ref 4.0–10.5)
nRBC: 0 % (ref 0.0–0.2)

## 2019-08-07 LAB — GLUCOSE, CAPILLARY
Glucose-Capillary: 113 mg/dL — ABNORMAL HIGH (ref 70–99)
Glucose-Capillary: 131 mg/dL — ABNORMAL HIGH (ref 70–99)
Glucose-Capillary: 145 mg/dL — ABNORMAL HIGH (ref 70–99)
Glucose-Capillary: 84 mg/dL (ref 70–99)
Glucose-Capillary: 91 mg/dL (ref 70–99)

## 2019-08-07 LAB — MAGNESIUM: Magnesium: 2.1 mg/dL (ref 1.7–2.4)

## 2019-08-07 NOTE — Progress Notes (Signed)
Patient with foley in place from prior admission.  MD paged to request order to continue foley.

## 2019-08-07 NOTE — Progress Notes (Signed)
Subjective:   Pt was seen at the bedside today. He states he is doing "pretty good." We discussed we will continue with diuresis today. We also discussed plan to call his nephrologist, Dr. Hollie Salk, to update her.  No other complaints at this time.   Objective: CBC Latest Ref Rng & Units 08/06/2019 08/05/2019 08/01/2019  WBC 4.0 - 10.5 K/uL 5.0 5.4 6.1  Hemoglobin 13.0 - 17.0 g/dL 7.7(L) 8.0(L) 8.1(L)  Hematocrit 39.0 - 52.0 % 23.0(L) 24.1(L) 24.4(L)  Platelets 150 - 400 K/uL 268 282 291   BMP Latest Ref Rng & Units 08/06/2019 08/05/2019 08/04/2019  Glucose 70 - 99 mg/dL 101(H) 106(H) 130(H)  BUN 8 - 23 mg/dL 29(H) 29(H) 28(H)  Creatinine 0.61 - 1.24 mg/dL 2.96(H) 3.02(H) 3.06(H)  BUN/Creat Ratio 10 - 24 - - -  Sodium 135 - 145 mmol/L 142 141 141  Potassium 3.5 - 5.1 mmol/L 4.6 4.6 5.3(H)  Chloride 98 - 111 mmol/L 110 112(H) 115(H)  CO2 22 - 32 mmol/L 21(L) 22 18(L)  Calcium 8.9 - 10.3 mg/dL 8.4(L) 8.4(L) 8.3(L)   Vital signs in last 24 hours: Vitals:   08/06/19 0521 08/06/19 1955 08/07/19 0527 08/07/19 0532  BP: (!) 148/73 (!) 153/70  129/85  Pulse: 61 (!) 59  (!) 58  Resp: 17 17  17   Temp: 98.5 F (36.9 C) 98.4 F (36.9 C)  98.5 F (36.9 C)  TempSrc: Oral Oral  Oral  SpO2: 98% 100%  98%  Weight:   82.4 kg   Height:       Physical Exam General: Comparably sitting up in bed watching TV HEENT: NCAT CV: RRR, normal S1-S2 no murmurs rubs or gallops appreciated Extremities: 1+ pitting edema in bilateral legs, 2+ in arms, improved form day prior PULM: Clear to auscultation bilaterally, no crackles or wheezes appreciated ABD: Soft and nontender in all quadrants  NEURO: Alert and oriented, nonfocal  Assessment/Plan:  Principal Problem:   Anasarca associated with disorder of kidney Active Problems:   Essential hypertension   Normocytic anemia   BPH with obstruction/lower urinary tract symptoms   CKD (chronic kidney disease) stage 4, GFR 15-29 ml/min (HCC)   Left renal mass   Anasarca   Enlarged prostate  In summary, Dennis Macias is a pleasant AA gentleman with PMHx of CKD stage IV, hypertension, type 2 diabetes mellitus, CVA, hyperlipidemia here with anasarca and decreased urine output concerning for nephrotic syndrome and worsening kidney function.  #Anasarca #CKD stage IV: Improving.Baseline Cr  Roughly 3.4. Continues to be at baseline today. Urine protein/Cr elevated to 1.93. UA demonstrates some proteinuria. Pt was getting this worked up with Nephrology in the outpatient setting.  -Will reach out to Dr. Hollie Salk to discuss plan for workup. -Continue IV Lasix 40 mg BID -Daily weights -Strict I's and O's -Daily renal function panel ordered  #Decreased Urine Output  #Hx hematuria #Kidney mass #Hx Elevated PSA: PSA elevated to 4.7 in March 2019 and subsequently 4.34 in June 2019. Most recently 3.4 as of 1/23.  Patient was supposed to continue following up with urology to evaluate the need for prostate biopsy, however patient never had this done. Patient had a CT of the abdomen and pelvis in 2019 which did not demonstrate any blastic or lytic lesions. Patient does have history of hematuria and extensive smoking history. High concern for malignancy, especially in the context of progressive weakness, decreased appetite and 30 pound weight loss over the last several months. Renal u/s from 1/5 demonstrated L  kidney mass. -MRI abdomen and pelvis performed on 1/27 was limited by motion artifact and diffuse anasarca. Recommend repeat imaging within 3 months after anasarca is resolved. However, hepatic and splenic iron deposition reported. -Liver function and iron studies ordered and were unremarkable. Hemachromatosis unlikely  -Patient will need urgent follow-up with urology in the outpatient setting once discharged from the hospital  #Hx CVA #HTN #HLD -Metoprolol 12.5 mg twice daily -Atorvastatin 80 mg daily -Aspirin 81 mg daily  #Hx BL cataracts s/p extraction #Hx  Glaucoma #R eye vision loss -Cosopt ophthalmic solution 1 drop each eye BID  #T2DM -SSI  #FEN/GI -Diet: Renal, 1200 cc fluid restriction -Fluids: none  #DVT prophylaxis -Heparin 5000 units subq q8hrs  #CODE STATUS: FULL  #Dispo Prior to Admission Living Arrangement: Home w/ [facility] PT Anticipated Discharge Location: Home w/ HH PT Barriers to Discharge: Ongoing medical workup  Earlene Plater, MD Internal Medicine, PGY1 Pager: [phone]  08/07/2019,10:42 AM

## 2019-08-08 LAB — GLUCOSE, CAPILLARY
Glucose-Capillary: 109 mg/dL — ABNORMAL HIGH (ref 70–99)
Glucose-Capillary: 135 mg/dL — ABNORMAL HIGH (ref 70–99)
Glucose-Capillary: 153 mg/dL — ABNORMAL HIGH (ref 70–99)
Glucose-Capillary: 158 mg/dL — ABNORMAL HIGH (ref 70–99)

## 2019-08-08 LAB — CBC
HCT: 23.8 % — ABNORMAL LOW (ref 39.0–52.0)
Hemoglobin: 7.9 g/dL — ABNORMAL LOW (ref 13.0–17.0)
MCH: 30.5 pg (ref 26.0–34.0)
MCHC: 33.2 g/dL (ref 30.0–36.0)
MCV: 91.9 fL (ref 80.0–100.0)
Platelets: 308 10*3/uL (ref 150–400)
RBC: 2.59 MIL/uL — ABNORMAL LOW (ref 4.22–5.81)
RDW: 13.8 % (ref 11.5–15.5)
WBC: 7.9 10*3/uL (ref 4.0–10.5)
nRBC: 0 % (ref 0.0–0.2)

## 2019-08-08 LAB — RENAL FUNCTION PANEL
Albumin: 1.8 g/dL — ABNORMAL LOW (ref 3.5–5.0)
Anion gap: 7 (ref 5–15)
BUN: 36 mg/dL — ABNORMAL HIGH (ref 8–23)
CO2: 22 mmol/L (ref 22–32)
Calcium: 8.2 mg/dL — ABNORMAL LOW (ref 8.9–10.3)
Chloride: 110 mmol/L (ref 98–111)
Creatinine, Ser: 3.27 mg/dL — ABNORMAL HIGH (ref 0.61–1.24)
GFR calc Af Amer: 21 mL/min — ABNORMAL LOW (ref >60)
GFR calc non Af Amer: 18 mL/min — ABNORMAL LOW (ref >60)
Glucose, Bld: 115 mg/dL — ABNORMAL HIGH (ref 70–99)
Phosphorus: 4 mg/dL (ref 2.5–4.6)
Potassium: 4.5 mmol/L (ref 3.5–5.1)
Sodium: 139 mmol/L (ref 135–145)

## 2019-08-08 LAB — MAGNESIUM: Magnesium: 1.9 mg/dL (ref 1.7–2.4)

## 2019-08-08 MED ORDER — FUROSEMIDE 10 MG/ML IJ SOLN
40.0000 mg | Freq: Two times a day (BID) | INTRAMUSCULAR | Status: DC
  Administered 2019-08-08 – 2019-08-10 (×5): 40 mg via INTRAVENOUS
  Filled 2019-08-08 (×5): qty 4

## 2019-08-08 NOTE — Plan of Care (Signed)
  Problem: Education: Goal: Knowledge of General Education information will improve Description: Including pain rating scale, medication(s)/side effects and non-pharmacologic comfort measures Outcome: Progressing   Problem: Health Behavior/Discharge Planning: Goal: Ability to manage health-related needs will improve Outcome: Progressing   Problem: Clinical Measurements: Goal: Diagnostic test results will improve Outcome: Progressing Goal: Respiratory complications will improve Outcome: Progressing Goal: Cardiovascular complication will be avoided Outcome: Progressing   Problem: Activity: Goal: Risk for activity intolerance will decrease Outcome: Progressing Note: Patient is showing greater tolerance for mobility transitions from bed to chair and bed to [facility]   Problem: Safety: Goal: Ability to remain free from injury will improve Outcome: Progressing   Problem: Skin Integrity: Goal: Risk for impaired skin integrity will decrease Outcome: Progressing Note: Swelling is much improved

## 2019-08-08 NOTE — Progress Notes (Addendum)
Subjective:   Pt was seen at the bedside today. Pt says he feels well and the swelling is getting better with each day.  He is asking when he can go home.  I told the patient we will continue monitoring his kidney function while we attempt to diurese him further.  No other complaints at this time  Objective: CBC Latest Ref Rng & Units 08/07/2019 08/06/2019 08/05/2019  WBC 4.0 - 10.5 K/uL 5.5 5.0 5.4  Hemoglobin 13.0 - 17.0 g/dL 7.8(L) 7.7(L) 8.0(L)  Hematocrit 39.0 - 52.0 % 23.7(L) 23.0(L) 24.1(L)  Platelets 150 - 400 K/uL 284 268 282   BMP Latest Ref Rng & Units 08/07/2019 08/06/2019 08/05/2019  Glucose 70 - 99 mg/dL 86 101(H) 106(H)  BUN 8 - 23 mg/dL 33(H) 29(H) 29(H)  Creatinine 0.61 - 1.24 mg/dL 2.98(H) 2.96(H) 3.02(H)  BUN/Creat Ratio 10 - 24 - - -  Sodium 135 - 145 mmol/L 140 142 141  Potassium 3.5 - 5.1 mmol/L 4.7 4.6 4.6  Chloride 98 - 111 mmol/L 112(H) 110 112(H)  CO2 22 - 32 mmol/L 21(L) 21(L) 22  Calcium 8.9 - 10.3 mg/dL 8.2(L) 8.4(L) 8.4(L)   Vital signs in last 24 hours: Vitals:   08/07/19 2111 08/08/19 0152 08/08/19 0622 08/08/19 0622  BP: (!) 155/77  (!) 156/86 (!) 156/86  Pulse: 66   68  Resp: 16   16  Temp: 98 F (36.7 C)   98.4 F (36.9 C)  TempSrc: Oral   Oral  SpO2: 100%   97%  Weight:  81.2 kg    Height:       Physical Exam General: Comparably sitting up in bed watching TV HEENT: NCAT CV: RRR, normal S1-S2 no murmurs rubs or gallops appreciated Extremities: Trace edema in bilateral legs, 2+ in arms, improved form day prior. Tegaderm in place on bilateral arms covering skin tears secondary to anasarca are dry and intact. PULM: Clear to auscultation bilaterally, no crackles or wheezes appreciated ABD: Soft and nontender in all quadrants  NEURO: Alert and oriented, nonfocal  Assessment/Plan:  Principal Problem:   Anasarca associated with disorder of kidney Active Problems:   Essential hypertension   Normocytic anemia   BPH with obstruction/lower  urinary tract symptoms   CKD (chronic kidney disease) stage 4, GFR 15-29 ml/min (HCC)   Left renal mass   Enlarged prostate   Hypomagnesemia  In summary, Mr. Arwood is a pleasant AA gentleman with PMHx of CKD stage IV, hypertension, type 2 diabetes mellitus, CVA, hyperlipidemia here with anasarca and decreased urine output concerning for nephrotic syndrome and worsening kidney function.  #Anasarca #CKD stage IV: Baseline Cr  Roughly 3.4. Continues to be at baseline today at 3.27 Skin tears from anasarca on bilateral arms are dry and intact. Urine protein/Cr elevated to 1.93. UA demonstrates some proteinuria. Pt was getting this worked up with Nephrology in the outpatient setting. Discussed pt with Dr. Hollie Salk, who agrees with the current plan. -IV Lasix 40 mg BID, Wt 81.2 kg from 82.9kg. Bump in Cr. To 3.27 from 2.98. Continue to monitor -Daily weights -Strict I's and O's -Daily renal function panel ordered -Compression stockings for arms ordered -F/u with Nephrology in the outpatient setting   #Decreased Urine Output  #Hx hematuria #Kidney mass #Hx Elevated PSA: PSA elevated to 4.7 in March 2019 and subsequently 4.34 in June 2019. Most recently 3.4 as of 1/23.  Patient was supposed to continue following up with urology to evaluate the need for prostate biopsy, however  patient never had this done. Patient had a CT of the abdomen and pelvis in 2019 which did not demonstrate any blastic or lytic lesions. Patient does have history of hematuria and extensive smoking history. High concern for malignancy, especially in the context of progressive weakness, decreased appetite and 30 pound weight loss over the last several months. Renal u/s from 1/5 demonstrated L kidney mass. -MRI abdomen and pelvis performed on 1/27 was limited by motion artifact and diffuse anasarca. Recommend repeat imaging within 3 months after anasarca is resolved. However, hepatic and splenic iron deposition reported. -Liver  function and iron studies ordered and were unremarkable. Hemachromatosis unlikely  -Patient will need urgent follow-up with urology in the outpatient setting once discharged from the hospital  #Hx CVA #HTN #HLD -Metoprolol 12.5 mg twice daily -Atorvastatin 80 mg daily -Aspirin 81 mg daily  #Hx BL cataracts s/p extraction #Hx Glaucoma #R eye vision loss -Cosopt ophthalmic solution 1 drop each eye BID  #T2DM -SSI  #FEN/GI -Diet: Renal, 1200 cc fluid restriction -Fluids: none  #DVT prophylaxis -Heparin 5000 units subq q8hrs  #CODE STATUS: FULL  #Dispo Prior to Admission Living Arrangement: Home w/ [facility] PT Anticipated Discharge Location: Home w/ HH PT Barriers to Discharge: Ongoing medical workup  Earlene Plater, MD Internal Medicine, PGY1 Pager: [phone]  08/08/2019,7:38 AM

## 2019-08-09 LAB — GLUCOSE, CAPILLARY
Glucose-Capillary: 101 mg/dL — ABNORMAL HIGH (ref 70–99)
Glucose-Capillary: 123 mg/dL — ABNORMAL HIGH (ref 70–99)
Glucose-Capillary: 100 mg/dL — ABNORMAL HIGH (ref 70–99)
Glucose-Capillary: 147 mg/dL — ABNORMAL HIGH (ref 70–99)

## 2019-08-09 LAB — RENAL FUNCTION PANEL
Albumin: 1.7 g/dL — ABNORMAL LOW (ref 3.5–5.0)
Anion gap: 7 (ref 5–15)
BUN: 36 mg/dL — ABNORMAL HIGH (ref 8–23)
CO2: 22 mmol/L (ref 22–32)
Calcium: 8.3 mg/dL — ABNORMAL LOW (ref 8.9–10.3)
Chloride: 111 mmol/L (ref 98–111)
Creatinine, Ser: 2.96 mg/dL — ABNORMAL HIGH (ref 0.61–1.24)
GFR calc Af Amer: 24 mL/min — ABNORMAL LOW (ref >60)
GFR calc non Af Amer: 21 mL/min — ABNORMAL LOW (ref >60)
Glucose, Bld: 110 mg/dL — ABNORMAL HIGH (ref 70–99)
Phosphorus: 3.7 mg/dL (ref 2.5–4.6)
Potassium: 4.2 mmol/L (ref 3.5–5.1)
Sodium: 140 mmol/L (ref 135–145)

## 2019-08-09 LAB — MAGNESIUM: Magnesium: 1.8 mg/dL (ref 1.7–2.4)

## 2019-08-09 NOTE — Discharge Summary (Addendum)
Name: Dennis Macias MRN: [id number] DOB: 30-Aug-1950 69 y.o. PCP: [name], MD  Date of Admission: 08/04/2019  5:40 PM Date of Discharge: 08/10/2019  Attending Physician: Dr. Aldine Contes  Discharge Diagnosis: 1. Anasarca in setting of CKD IV 2. Suspected Urologic vs Renal Malignancy 3. Hx of CVA, HTN, HLD   Discharge Medications: Allergies as of 08/10/2019   No Known Allergies     Medication List    TAKE these medications   Accu-Chek FastClix Lancets Misc Check blood sugar up to 7 times a week as instructed Notes to patient: See directions   Accu-Chek Guide w/Device Kit 1 each by Does not apply route daily. Check blood sugar as instructed up to 7 times a week Notes to patient: See directions   amLODipine 10 MG tablet Commonly known as: NORVASC Take 1 tablet (10 mg total) by mouth daily.   aspirin 81 MG tablet Take 1 tablet (81 mg total) by mouth daily. Internal Medicine Program place on hold until patient requests to fill   atorvastatin 80 MG tablet Commonly known as: LIPITOR Take 1 tablet (80 mg total) by mouth at bedtime. IM program   dorzolamide-timolol 22.3-6.8 MG/ML ophthalmic solution Commonly known as: COSOPT Place 1 drop into the right eye 2 (two) times daily.   furosemide 40 MG tablet Commonly known as: Lasix Take 1 tablet (40 mg total) by mouth 2 (two) times daily.   glucose blood test strip Commonly known as: Accu-Chek Guide Check blood sugar up to 7 times a week as instructed Notes to patient: See directions   metoprolol tartrate 25 MG tablet Commonly known as: LOPRESSOR Take 0.5 tablets (12.5 mg total) by mouth 2 (two) times daily.   sodium bicarbonate 650 MG tablet Take 650 mg by mouth 2 (two) times daily.   tamsulosin 0.4 MG Caps capsule Commonly known as: FLOMAX Take 1 capsule (0.4 mg total) by mouth daily after supper.   vitamin B-12 1000 MCG tablet Commonly known as: CYANOCOBALAMIN Take 1,000 mcg by mouth daily.       Disposition and follow-up:   Dennis Macias was discharged from [facility] in Stable condition.  At the hospital follow up visit please address:  1. Anasarca in setting of CKD IV: Patient initially presented to the outpatient clinic on 1/26 after hospital follow-up (1/22-1/23)  after sustaining a fall. At his clinic visit, the patient was noted to have 3+ pitting edema in bilateral upper and lower extremities with decreased urine output. Patient was admitted for suspected nephrotic syndrome and IV diuresis. Patient responded well to IV Lasix with improvement in edema and was discharged with a dry weight of 80.9kg. Patient discharged on Lasix 66m bid and continued compression stockings and unna boots.  - F/u BMP for renal function monitoring - F/u w/ Dr. UHollie Salkof Nephrology as soon as possible - Continue compression stockings and unna boots ([place] to change every 5 days)  2. Suspected Urologic Malignancy: Patient's had a several month history of intentional weight loss, decreased appetite, deconditioning and weakness. Has hx of elevated PSA, hematuria, and urinary retention. Renal ultrasound on 1/5 demonstrated left kidney mass with recommended additional imaging. Obtained MRI of the pelvis and abdomen w/o contrast which was inconclusive due to excessive anasarca. Recommended repeat imaging within 3 months once anasarca is resolved. -Repeat MRI abdomen and pelvis in 3 months -Needs urgent referral to urology for cystoscopy and further urologic work-up  4.  Labs / imaging needed at time of follow-up:  BMP  5.  Pending labs/ test needing follow-up: None  Follow-up Appointments: Follow-up Information    [name], MD. Go to.   Specialty: Internal Medicine Why: On Thursday, 08/13/19, at 10:45. Please show up 15 minutes prior to your appoinment to check in. If this does not work for you please contact us to change the date/time.  Contact information: [address] [address] [place]. Schedule an appointment as soon as possible for a visit in 1 week(s).   Contact information: [place]       Madelon Lips, MD. Schedule an appointment as soon as possible for a visit in 1 week(s).   Specialty: Nephrology Contact information: [place] [address] [phone]        [facility] Follow up.   Specialty: Home Health Services Why: [facility], HHPT Contact information: [place] [address] [phone]           Hospital Course by problem list: 1. Anasarca in setting of CKD IV Baseline sCr ~3.4. Patient presented with worsening anasarca, decreased urine output and worsening renal function concerning for nephrotic syndrome. Urine protein/cr elevated to 1.93 and UA with proteinuria. Patient was previously getting this worked up with nephrology in outpatient setting. Patient admitted for concerns of nephrotic syndrome for which he received IV diuresis. His edema greatly improved and patient down 1.6kg from admission. sCr 2.99 on discharge. Patient discharged to home with compression stockings and unna boots (to be changed by home health RN every 5 days). He is continued on PO Lasix 75m bid [facility]   2. Suspected Urologic Malignancy  Patient with history of hematuria, elevated PSA since 2019 with extensive smoking history in setting of progressive weakness, decreased appetite and weight loss of 30lbs over past several months. CT Abdomen/Pelvis in 2019 without any lesions. Renal [facility] 07/14/2019 with L renal mass. MRI Abdomen/Pelvis on 08/05/19 limited by motion artifact and diffuse anasarca. However, did note to have hepatic and splenic iron deposition. LFTs and iron studies unremarkable, less concern for hemochromatosis.  Patient has foley in  place and is to follow up urgently with urology in the outpatient setting for further evaluation.  3. Hx of CVA, HTN and HLD:  Continue metoprolol 12.564mbid, atorvastatin 8047md, and aspirin 72m55m.   Discharge Vitals:   BP (!) 149/80 (BP Location: Left Arm)    Pulse 61    Temp 98.1 F (36.7 C)    Resp 18    Ht 6' (1.829 m)    Wt 80.9 kg Comment: scale b   SpO2 99%    BMI 24.20 kg/m   Pertinent Labs, Studies, and Procedures:  CBC Latest Ref Rng & Units 08/08/2019 08/07/2019 08/06/2019  WBC 4.0 - 10.5 K/uL 7.9 5.5 5.0  Hemoglobin 13.0 - 17.0 g/dL 7.9(L) 7.8(L) 7.7(L)  Hematocrit 39.0 - 52.0 % 23.8(L) 23.7(L) 23.0(L)  Platelets 150 - 400 K/uL 308 284 268  \ BMP Latest Ref Rng & Units 08/10/2019 08/09/2019 08/08/2019  Glucose 70 - 99 mg/dL 129(H) 110(H) 115(H)  BUN 8 - 23 mg/dL 37(H) 36(H) 36(H)  Creatinine 0.61 - 1.24 mg/dL 2.99(H) 2.96(H) 3.27(H)  BUN/Creat Ratio 10 - 24 - - -  Sodium 135 - 145 mmol/L 139 140 139  Potassium 3.5 -  5.1 mmol/L 4.1 4.2 4.5  Chloride 98 - 111 mmol/L 106 111 110  CO2 22 - 32 mmol/L _0 Calcium 8.9 - 10.3 mg/dL 8.4(L) 8.3(L) 8.2(L)   MRI Abdomen: IMPRESSION: 1. Limited study due to anasarca and respiratory motion. There is no visible abnormality in the area of concern in the left kidney. Is consider repeat imaging of this patient after resolution of acute symptoms, within 3 months time for further assessment. Repeat ultrasound with cine images through the left kidney could also be performed during this time interval but would be best done after resolution of acute illness as well. 2. Liver and spleen display low signal suggesting iron deposition. This is particularly true of the spleen.  MRI Pelvis: IMPRESSION: 1. Limited study due to abundant motion artifact and anasarca including large volume ascites. 2. Large volume ascites tracks into the pelvis, also associated with diffuse anasarca. 3. Prostate enlargement and heterogeneity as described,  nonspecific finding. 4. Low signal in the marrow spaces suggests iron deposition in the spleen.  Discharge Instructions: Discharge Instructions    Call MD for:  difficulty breathing, headache or visual disturbances   Complete by: As directed    Call MD for:  extreme fatigue   Complete by: As directed    Call MD for:  hives   Complete by: As directed    Call MD for:  persistant dizziness or light-headedness   Complete by: As directed    Call MD for:  persistant nausea and vomiting   Complete by: As directed    Call MD for:  redness, tenderness, or signs of infection (pain, swelling, redness, odor or green/yellow discharge around incision site)   Complete by: As directed    Call MD for:  severe uncontrolled pain   Complete by: As directed    Call MD for:  temperature >100.4   Complete by: As directed    Diet - low sodium heart healthy   Complete by: As directed    Discharge instructions   Complete by: As directed    Dennis Macias, Dennis Macias were admitted for worsening swelling, weakness, and decreased urine output as a result of worsening kidney function. During your admission, we treated you with Lasix to help remove the fluid resulting in improvement in the swelling. On discharge, please continue to use the compression stockings to help with swelling. Also continue to take the Lasix 38m twice daily. [facility] at 10:45AM.  Also, please schedule an appointment to see Dr. UHollie Salk(nephrologist) at your earliest convenience for further monitoring of your kidney function.  Please schedule an appointment with the urologist at your earliest convenience to assess for possible malignancy.  Thank you!   Increase activity slowly   Complete by: As directed    Increase activity slowly   Complete by: As directed       Signed: SHarvie Heck MD Internal Medicine, PGY1 Pager: [phone]/07/2019,1:37 PM

## 2019-08-09 NOTE — Progress Notes (Signed)
Subjective:   Pt was seen at the bedside today.  Patient was eating breakfast sitting up.  States he waited too long to start eating his food because now is cold.  No other complaints at this time.  Patient still feels a little swollen in his arms, but noting that they are better than the day prior.  Patient had compression stockings on bilateral arms, but took them off prior to eating breakfast.  He thinks that they are helpful. We discussed the plan to continue diuresis and the patient is in agreement with the plan.  No other complaints at this time.  Objective: CBC Latest Ref Rng & Units 08/08/2019 08/07/2019 08/06/2019  WBC 4.0 - 10.5 K/uL 7.9 5.5 5.0  Hemoglobin 13.0 - 17.0 g/dL 7.9(L) 7.8(L) 7.7(L)  Hematocrit 39.0 - 52.0 % 23.8(L) 23.7(L) 23.0(L)  Platelets 150 - 400 K/uL 308 284 268   BMP Latest Ref Rng & Units 08/08/2019 08/07/2019 08/06/2019  Glucose 70 - 99 mg/dL 115(H) 86 101(H)  BUN 8 - 23 mg/dL 36(H) 33(H) 29(H)  Creatinine 0.61 - 1.24 mg/dL 3.27(H) 2.98(H) 2.96(H)  BUN/Creat Ratio 10 - 24 - - -  Sodium 135 - 145 mmol/L 139 140 142  Potassium 3.5 - 5.1 mmol/L 4.5 4.7 4.6  Chloride 98 - 111 mmol/L 110 112(H) 110  CO2 22 - 32 mmol/L 22 21(L) 21(L)  Calcium 8.9 - 10.3 mg/dL 8.2(L) 8.2(L) 8.4(L)   Vital signs in last 24 hours: Vitals:   08/08/19 1635 08/08/19 2128 08/09/19 0423 08/09/19 0500  BP: (!) 142/73 (!) 157/75 (!) 150/82   Pulse: 61 71 65   Resp: 20 18 16    Temp: 98 F (36.7 C) 98.2 F (36.8 C) 99 F (37.2 C)   TempSrc: Oral Oral Oral   SpO2: 98% 100% 99%   Weight:    80.5 kg  Height:       Physical Exam General: Comparably sitting up in bed watching TV HEENT: NCAT CV: RRR, normal S1-S2 no murmurs rubs or gallops appreciated Extremities: Trace edema in bilateral legs, 2+ in arms, improved from day prior. Healing skin tears on bilateral forearms from anasarca are scabbing  PULM: Clear to auscultation bilaterally, no crackles or wheezes appreciated ABD: Soft  and nontender in all quadrants  NEURO: Alert and oriented, nonfocal  Assessment/Plan:  Principal Problem:   Anasarca associated with disorder of kidney Active Problems:   Essential hypertension   Normocytic anemia   BPH with obstruction/lower urinary tract symptoms   CKD (chronic kidney disease) stage 4, GFR 15-29 ml/min (HCC)   Left renal mass   Enlarged prostate   Hypomagnesemia  In summary, [name] is a pleasant AA gentleman with PMHx of CKD stage IV, hypertension, type 2 diabetes mellitus, CVA, hyperlipidemia here with anasarca and decreased urine output concerning for nephrotic syndrome and worsening kidney function.  Patient has responded well to IV diuresis his creatinine continues to be stable.  #Anasarca #CKD stage IV: Baseline Cr has been roughly 3.4 in the past. Continues to be at baseline today at 2.96.  Skin tears from anasarca on bilateral arms are healing with scabs. Urine protein/Cr elevated to 1.93. UA demonstrates some proteinuria. Pt was getting this worked up with Nephrology in the outpatient setting. Discussed pt with Dr. Hollie Salk, who agrees with the current plan. -IV Lasix 40 mg BID, Wt 80.5 kg from 82.9kg on admission. Cr Stable. Continue to monitor -Daily weights -Strict I's and O's -Daily renal function panel ordered -Compression stockings  for arms  -F/u with Nephrology in the outpatient setting   #Decreased Urine Output  #Hx hematuria #Kidney mass #Hx Elevated PSA: PSA elevated to 4.7 in March 2019 and subsequently 4.34 in June 2019. Most recently 3.4 as of 1/23.  Patient was supposed to continue following up with urology to evaluate the need for prostate biopsy, however patient never had this done. Patient had a CT of the abdomen and pelvis in 2019 which did not demonstrate any blastic or lytic lesions. Patient does have history of hematuria and extensive smoking history. High concern for malignancy, especially in the context of progressive weakness,  decreased appetite and 30 pound weight loss over the last several months. Renal u/s from 1/5 demonstrated L kidney mass. -MRI abdomen and pelvis performed on 1/27 was limited by motion artifact and diffuse anasarca. Recommend repeat imaging within 3 months after anasarca is resolved. However, hepatic and splenic iron deposition reported. -Liver function and iron studies ordered and were unremarkable. Hemachromatosis unlikely  -Patient will need urgent follow-up with urology in the outpatient setting once discharged from the hospital  #Hx CVA #HTN #HLD -Metoprolol 12.5 mg twice daily -Atorvastatin 80 mg daily -Aspirin 81 mg daily  #Hx BL cataracts s/p extraction #Hx Glaucoma #R eye vision loss -Cosopt ophthalmic solution 1 drop each eye BID  #T2DM -SSI  #FEN/GI -Diet: Renal, 1200 cc fluid restriction -Fluids: none  #DVT prophylaxis -Heparin 5000 units subq q8hrs  #CODE STATUS: FULL  #Dispo Prior to Admission Living Arrangement: Home w/ [facility] PT Anticipated Discharge Location: Home w/ HH PT Barriers to Discharge: Ongoing medical workup  Earlene Plater, MD Internal Medicine, PGY1 Pager: [phone]  08/09/2019,10:11 AM

## 2019-08-09 NOTE — Progress Notes (Signed)
Orthopedic Tech Progress Note Patient Details:  Toy Eisemann Feb 09, 1951 [id number]  Ortho Devices Type of Ortho Device: Haematologist, Ace wrap Ortho Device/Splint Location: bilateral legs Ortho Device/Splint Interventions: Application, Ordered   Post Interventions Patient Tolerated: Well Instructions Provided: Care of device, Adjustment of device   Janit Pagan 08/09/2019, 1:26 PM

## 2019-08-09 NOTE — Plan of Care (Signed)
  Problem: Education: Goal: Knowledge of General Education information will improve Description: Including pain rating scale, medication(s)/side effects and non-pharmacologic comfort measures Outcome: Progressing   Problem: Health Behavior/Discharge Planning: Goal: Ability to manage health-related needs will improve Outcome: Progressing   Problem: Clinical Measurements: Goal: Ability to maintain clinical measurements within normal limits will improve Outcome: Progressing Goal: Will remain free from infection Outcome: Progressing Goal: Diagnostic test results will improve Outcome: Progressing Goal: Respiratory complications will improve Outcome: Progressing Goal: Cardiovascular complication will be avoided Outcome: Progressing   Problem: Activity: Goal: Risk for activity intolerance will decrease Outcome: Not Progressing Note: Patient needs additional encouragement to ambulate in room and transfer to chair

## 2019-08-10 ENCOUNTER — Other Ambulatory Visit: Payer: Self-pay

## 2019-08-10 ENCOUNTER — Telehealth: Payer: Self-pay | Admitting: Internal Medicine

## 2019-08-10 DIAGNOSIS — Z87891 Personal history of nicotine dependence: Secondary | ICD-10-CM

## 2019-08-10 DIAGNOSIS — N049 Nephrotic syndrome with unspecified morphologic changes: Secondary | ICD-10-CM

## 2019-08-10 DIAGNOSIS — E46 Unspecified protein-calorie malnutrition: Secondary | ICD-10-CM

## 2019-08-10 DIAGNOSIS — Z6824 Body mass index (BMI) 24.0-24.9, adult: Secondary | ICD-10-CM

## 2019-08-10 LAB — BASIC METABOLIC PANEL
Anion gap: 11 (ref 5–15)
BUN: 37 mg/dL — ABNORMAL HIGH (ref 8–23)
CO2: 22 mmol/L (ref 22–32)
Calcium: 8.4 mg/dL — ABNORMAL LOW (ref 8.9–10.3)
Chloride: 106 mmol/L (ref 98–111)
Creatinine, Ser: 2.99 mg/dL — ABNORMAL HIGH (ref 0.61–1.24)
GFR calc Af Amer: 24 mL/min — ABNORMAL LOW (ref >60)
GFR calc non Af Amer: 20 mL/min — ABNORMAL LOW (ref >60)
Glucose, Bld: 129 mg/dL — ABNORMAL HIGH (ref 70–99)
Potassium: 4.1 mmol/L (ref 3.5–5.1)
Sodium: 139 mmol/L (ref 135–145)

## 2019-08-10 LAB — GLUCOSE, CAPILLARY
Glucose-Capillary: 102 mg/dL — ABNORMAL HIGH (ref 70–99)
Glucose-Capillary: 104 mg/dL — ABNORMAL HIGH (ref 70–99)

## 2019-08-10 LAB — MAGNESIUM: Magnesium: 1.6 mg/dL — ABNORMAL LOW (ref 1.7–2.4)

## 2019-08-10 MED ORDER — FUROSEMIDE 40 MG PO TABS: 40.0000 mg | ORAL_TABLET | Freq: Two times a day (BID) | ORAL | 0 refills | Status: DC

## 2019-08-10 MED FILL — FUROSEMIDE 40 MG TAB: 40 | 30 days supply | Qty: 60 | Fill #0

## 2019-08-10 NOTE — Progress Notes (Addendum)
Subjective: HD#6 Overnight, no acute events reported.   This morning, patient was examined at bedside. Patient reports that he is feeling better today, feels like his swelling is going down. He reports that the wraps have been helping. Discussed discharge planning with patient and need for close follow up in clinic, with nephrologist and urologist.   Objective: CBC Latest Ref Rng & Units 08/08/2019 08/07/2019 08/06/2019  WBC 4.0 - 10.5 K/uL 7.9 5.5 5.0  Hemoglobin 13.0 - 17.0 g/dL 7.9(L) 7.8(L) 7.7(L)  Hematocrit 39.0 - 52.0 % 23.8(L) 23.7(L) 23.0(L)  Platelets 150 - 400 K/uL 308 284 268   BMP Latest Ref Rng & Units 08/10/2019 08/09/2019 08/08/2019  Glucose 70 - 99 mg/dL 129(H) 110(H) 115(H)  BUN 8 - 23 mg/dL 37(H) 36(H) 36(H)  Creatinine 0.61 - 1.24 mg/dL 2.99(H) 2.96(H) 3.27(H)  BUN/Creat Ratio 10 - 24 - - -  Sodium 135 - 145 mmol/L 139 140 139  Potassium 3.5 - 5.1 mmol/L 4.1 4.2 4.5  Chloride 98 - 111 mmol/L 106 111 110  CO2 22 - 32 mmol/L 22 22 22   Calcium 8.9 - 10.3 mg/dL 8.4(L) 8.3(L) 8.2(L)   Vital signs in last 24 hours: Vitals:   08/09/19 1411 08/09/19 1930 08/09/19 2323 08/10/19 0406  BP: (!) 160/70 (!) 149/78 (!) 155/74 (!) 163/68  Pulse: (!) 59 65 66 70  Resp: 20 18 16 18   Temp: 98 F (36.7 C) 98.1 F (36.7 C) 98.5 F (36.9 C) 99.1 F (37.3 C)  TempSrc: Oral Oral Oral Oral  SpO2: 100% 97% 98% 98%  Weight:    80.9 kg  Height:       Physical Exam Physical Exam  Constitutional: He is oriented to person, place, and time. He appears malnourished. No distress.  HENT:  Head: Normocephalic and atraumatic.  Cardiovascular: Normal rate, regular rhythm, normal heart sounds and intact distal pulses. Exam reveals no gallop and no friction rub.  No murmur heard. Pulmonary/Chest: Effort normal and breath sounds normal. No respiratory distress. He has no wheezes. He has no rales.  Musculoskeletal:        General: Normal range of motion.     Comments: Bilateral lower  extremities in compression wrapping; no pitting edema noted to knee Bilateral upper extremities with compression sleeve with minimal edema noted in bilateral hands   Neurological: He is alert and oriented to person, place, and time.  Skin: Skin is warm and dry.   Assessment/Plan: Dennis Macias is a pleasant 69 year old gentleman with PMHx of CKD stage IV, hypertension, type 2 diabetes mellitus, CVA, hyperlipidemia presenting with anasarca, decreased urine output and worsening renal function concerning for nephrotic syndrome. He has responded well to IV diuresis.   Anasarca in setting of CKD IV:   Baseline sCr ~3.4. This AM, continues to be around baseline at 2.99 and stable from yesterday. Urine protein/Cr elevated to 1.93. UA demonstrates some proteinuria. Pt was getting this worked up with Nephrology in the outpatient setting. Patient responded well to IV diuresis with -1.6kg weight loss from admission. Patient's edema is greatly improved from admission. At this point, patient is safe for discharge to continue PO lasix and follow up closely in [facility] clinic and with nephrology.  - Lasix PO 40mg  bid  - Continue compression stockings  - F/u BMP on hospital f/u appointment 2/4 @ 10:45AM - F/u with Nephrology in the outpatient setting    Suspected urologic vs renal malignancy:  Patient with history of hematuria, elevated PSA in March  2019, June 2019 and recently in January 2021. He has an extensive smoking history. CT Abdomen/Pelvis in 2019 without blastic or lytic lesions. Renal US from 1/5 demonstrated L renal mass. MRI Abdomen/Pelvis on 1/27 limited by motion artifact and diffuse anasarca. However, did note to have hepatic and splenic iron deposition. LFTs and iron studies unremarkable. High concern for malignancy given progressive weakness, decreased appetite and 30lb weight loss over past several months. Also noted to have decreased urine output following recent admission with foley insertion.  - F/u  with urology in outpatient setting   Hx CVA, HTN and HLD -Metoprolol 12.5 mg twice daily -Atorvastatin 80 mg daily -Aspirin 81 mg daily  Hx BL cataracts s/p extraction Hx Glaucoma R eye vision loss -Cosopt ophthalmic solution 1 drop each eye BID  DM II:  -SSI  FEN/GI -Diet: Renal, 1200 cc fluid restriction -Fluids: none  DVT prophylaxis -Heparin 5000 units subq q8hrs  CODE STATUS: FULL  Dispo Prior to Admission Living Arrangement: Home w/ [facility] PT Anticipated Discharge Location: Home w/ [facility] PT Barriers to Discharge: None  Dennis Heck, MD Internal Medicine, PGY-1 Pager # [phone] 08/10/19   6:25 AM

## 2019-08-10 NOTE — Progress Notes (Signed)
Internal Medicine Clinic Attending  Case discussed with Dr. [name] at the time of the visit.  We reviewed the resident's history and exam and pertinent patient test results.  I agree with the assessment, diagnosis, and plan of care documented in the resident's note.    

## 2019-08-10 NOTE — Telephone Encounter (Signed)
HFU per Dr Sherry Ruffing 02/04 1045am

## 2019-08-10 NOTE — Consult Note (Signed)
   [facility] CM Inpatient Consult   08/10/2019  Dennis Macias Oct 30, 1950 [id number]   Patient screened for high risk score for unplanned readmission score  and for hospitalizations to check if potential [place] Management services are needed.   Spoke with patient at bedside phone HIPAA verified. Patient verbally agrees to post hospital follow up with [place] Coordinator. Patient states best number to call is the house phone at [phone] and his wife [name] is "the one to talk to".   Review of patient's medical record reveals patient is admitted with Anasarca.  Primary Care Provider is patient endorses [facility]  [facility]   Plan:  Follow up with inpatient [facility] team left a message.  For questions contact:   Natividad Brood, RN BSN [facility] Liaison  [phone] business mobile phone Toll free office [phone]  Fax number: [phone] [email or web address]

## 2019-08-10 NOTE — Progress Notes (Signed)
Internal Medicine Attending:   I saw and examined the patient. I reviewed the resident's note and I agree with the resident's findings and plan as documented in the resident's note.  Patient states that he feels better today denies any new complaints.  He states that his swelling is going down.  Patient was initially made to the hospital with anasarca in the setting of CKD stage IV.  Patient's creatinine has remained stable.  We will transition patient to oral Lasix 40 mg twice daily today.  Patient's edema has improved greatly since admission.  Patient is stable for DC home today.  No further work-up at this time.  Patient is suspected to have an underlying renal malignancy.  Renal ultrasound on January 5 demonstrated a left renal mass but MRI abdomen/pelvis was limited by motion artifact and diffuse anasarca.  Patient does have a high suspicion for an underlying malignancy given his weight loss and progressive weakness and decreased appetite.  Patient to follow-up with urology as an outpatient for this.

## 2019-08-10 NOTE — Evaluation (Signed)
Physical Therapy Evaluation Patient Details Name: Dennis Macias MRN: [id number] DOB: 03/17/1951 Today's Date: 08/10/2019   History of Present Illness  69yo male presenting as a direct admit from OP clinic due to concerns of reduced urinary output, B UE/LE edema. Had a recent admission from 07/31/19-08/01/19 due to a mechanical fall. Now admitted due to anasarca, CKD stage 4, reduced urine output. PMH R eye blindness, DM, glaucoma, CVA  Clinical Impression   Patient received in bed, pleasant and motivated to work with therapy, eager about possibly returning home. See levels of mobility/assist below. While he does need some extra support for functional bed mobility and transfers, once on his feet he is able to mobilize easily with min guard/RW. He reports he is at his baseline with transfers and has very supportive family that lives within walking distance and can help him. Cognition and safety awareness seems to be intact- really did not need a lot of external cuing for safety. He was left up in the chair with all needs met, chair alarm active. Feel he can likely return home with skilled HHPT services and support of family.     Follow Up Recommendations Home health PT;Supervision for mobility/OOB    Equipment Recommendations  None recommended by PT(well equipped and very supportive/available family)    Recommendations for Other Services       Precautions / Restrictions Precautions Precautions: Fall;Other (comment) Precaution Comments: R eye blind Restrictions Weight Bearing Restrictions: No      Mobility  Bed Mobility Overal bed mobility: Needs Assistance Bed Mobility: Supine to Sit     Supine to sit: Mod assist     General bed mobility comments: ModA to boost trunk all the way to upright, reports he has been having trouble with this at home  Transfers Overall transfer level: Needs assistance Equipment used: Rolling walker (2 wheeled) Transfers: Sit to/from Stand Sit to Stand: Min  assist;Mod assist         General transfer comment: MinA when not fatigued, heavy ModA when fatigued from walking; needs cues for nose over toes to facilitate smooth transfer  Ambulation/Gait Ambulation/Gait assistance: Min guard Gait Distance (Feet): 120 Feet Assistive device: Rolling walker (2 wheeled) Gait Pattern/deviations: Step-through pattern;Decreased stride length;Trunk flexed;Ataxic Gait velocity: decreased   General Gait Details: slow but steady with RW, mild ataxic gait/reduced proprioception noted BLEs  Stairs            Wheelchair Mobility    Modified Rankin (Stroke Patients Only)       Balance Overall balance assessment: Needs assistance Sitting-balance support: Feet supported Sitting balance-Leahy Scale: Good     Standing balance support: Bilateral upper extremity supported Standing balance-Leahy Scale: Poor Standing balance comment: moderate reliance through arms on walker                             Pertinent Vitals/Pain Pain Assessment: No/denies pain    Home Living Family/patient expects to be discharged to:: Private residence Living Arrangements: Spouse/significant other;Children Available Help at Discharge: Family;Friend(s);Available PRN/intermittently(fiance works, children live nearby and can be available pretty much as needed) Type of Home: Apartment Home Access: Stairs to enter Entrance Stairs-Rails: Right;Left Entrance Stairs-Number of Steps: 2 Home Layout: Two level [place]: [place] - single point;Toilet riser;Walker - 2 wheels;Bedside commode Additional Comments: fiance works, his children live near by and can come help him PRN    Prior Function Level of Independence: Needs assistance   Gait /  Transfers Assistance Needed: Recently has had difficulty rising from toilet, bed, low surfaces  ADL's / Homemaking Assistance Needed: uses RW>cane after his fall        Hand Dominance        Extremity/Trunk  Assessment   Upper Extremity Assessment Upper Extremity Assessment: Defer to OT evaluation    Lower Extremity Assessment Lower Extremity Assessment: Generalized weakness    Cervical / Trunk Assessment Cervical / Trunk Assessment: Normal  Communication   Communication: No difficulties  Cognition Arousal/Alertness: Awake/alert Behavior During Therapy: WFL for tasks assessed/performed Overall Cognitive Status: Within Functional Limits for tasks assessed                                        General Comments      Exercises     Assessment/Plan    PT Assessment Patient needs continued PT services  PT Problem List Decreased strength;Decreased activity tolerance;Decreased balance;Decreased mobility       PT Treatment Interventions DME instruction;Functional mobility training;Gait training;Stair training;Therapeutic activities;Therapeutic exercise;Balance training;Patient/family education    PT Goals (Current goals can be found in the Care Plan section)  Acute Rehab PT Goals Patient Stated Goal: "get stronger." PT Goal Formulation: With patient/family Time For Goal Achievement: 08/24/19 Potential to Achieve Goals: Good    Frequency Min 3X/week   Barriers to discharge        Co-evaluation               AM-PAC PT "6 Clicks" Mobility  Outcome Measure Help needed turning from your back to your side while in a flat bed without using bedrails?: A Little Help needed moving from lying on your back to sitting on the side of a flat bed without using bedrails?: A Lot Help needed moving to and from a bed to a chair (including a wheelchair)?: A Little Help needed standing up from a chair using your arms (e.g., wheelchair or bedside chair)?: A Lot Help needed to walk in hospital room?: A Little Help needed climbing 3-5 steps with a railing? : A Little 6 Click Score: 16    End of Session Equipment Utilized During Treatment: Gait belt Activity Tolerance:  Patient tolerated treatment well Patient left: in chair;with call bell/phone within reach;with chair alarm set Nurse Communication: Mobility status PT Visit Diagnosis: Unsteadiness on feet (R26.81);Muscle weakness (generalized) (M62.81);Difficulty in walking, not elsewhere classified (R26.2)    Time: 8242-3536 PT Time Calculation (min) (ACUTE ONLY): 26 min   Charges:   PT Evaluation $PT Eval Moderate Complexity: 1 Mod PT Treatments $Gait Training: 8-22 mins        Windell Norfolk, DPT, PN1   Supplemental Physical Therapist [place]    Pager [phone] Acute Rehab Office [phone]

## 2019-08-10 NOTE — TOC Transition Note (Addendum)
Transition of Care [facility]) - CM/SW Discharge Note   Patient Details  Name: Won Kreuzer MRN: [id number] Date of Birth: 1951-04-17  Transition of Care [facility]) CM/SW Contact:  Zenon Mayo, RN Phone Number: 08/10/2019, 12:50 PM   Clinical Narrative:    NCM spoke with patient offered choice, he states he does not have a preference.  NCM notified MD to put [facility] order in for patient, he has una boots on.  NCM made referral to [facility] with [facility] for [facility], HHPT, awaiting to hear back to see if she can take referral.  NCM called patient back in the room and he has left.  NCM still awaiting call back to see if [facility] able to take referral.  NCM received call back from [place] stating she can take referral for [facility]. Soc will begin 24 to 48 hrs post dc. NCM received call from [facility] stating patient is active with them.  NCM informed Tiffany with [facility] that patient is active with [facility].     Final next level of care: Home w Home Health Services Barriers to Discharge: No Barriers Identified   Patient Goals and CMS Choice   CMS Medicare.gov Compare Post Acute Care list provided to:: Patient Choice offered to / list presented to : Patient  Discharge Placement                       Discharge Plan and Services In-house Referral: NA Discharge Planning Services: CM Consult Post Acute Care Choice: Home Health                    HH Arranged: RN, PT [facility] Agency: [facility] Date [place]: 08/10/19 Time [place]: 1250 Representative spoke with at [place]: [place] ([place]) Interventions     Readmission Risk Interventions Readmission Risk Prevention Plan 08/10/2019  Transportation Screening Complete  PCP or Specialist Appt within 3-5 Days Complete  HRI or [place] Complete  Social Work Consult for [place] Planning/Counseling Complete  Palliative Care Screening Complete  Medication  Review Press photographer) Complete  Some recent data might be hidden

## 2019-08-10 NOTE — Progress Notes (Signed)
Discharge instructions reviewed with pt. Pt verbalizes understanding and states he has no questions. Pt belongings with pt. Pt is not in distress Discharged via wheelchair

## 2019-08-11 ENCOUNTER — Other Ambulatory Visit: Payer: Self-pay

## 2019-08-11 DIAGNOSIS — N200 Calculus of kidney: Secondary | ICD-10-CM | POA: Diagnosis not present

## 2019-08-11 DIAGNOSIS — R972 Elevated prostate specific antigen [PSA]: Secondary | ICD-10-CM | POA: Diagnosis not present

## 2019-08-11 DIAGNOSIS — D49512 Neoplasm of unspecified behavior of left kidney: Secondary | ICD-10-CM | POA: Diagnosis not present

## 2019-08-11 DIAGNOSIS — N401 Enlarged prostate with lower urinary tract symptoms: Secondary | ICD-10-CM | POA: Diagnosis not present

## 2019-08-11 DIAGNOSIS — R338 Other retention of urine: Secondary | ICD-10-CM | POA: Diagnosis not present

## 2019-08-11 NOTE — Patient Outreach (Signed)
[facility]) Care Management  08/11/2019  Jerimy Johanson Feb 11, 1951 [id number]   Referral Date: 08/11/19 Referral Source: Hospital liaison Date of Admission: 08/04/19 Diagnosis:  Anasarca Date of Discharge: 08/10/19 Facility: [place]: [facility]  Outreach attempt: No answer.  HIPAA compliant voice message left.  RN CM will attempt patient within the next 4 business days and send letter.    [name], RN, MSN [facility] Care Management Care Management Coordinator Direct Line [phone] Toll Free: [phone]  Fax: [phone]

## 2019-08-12 ENCOUNTER — Telehealth: Payer: Self-pay | Admitting: Internal Medicine

## 2019-08-12 ENCOUNTER — Ambulatory Visit: Payer: Medicare PPO | Admitting: Podiatry

## 2019-08-12 ENCOUNTER — Ambulatory Visit: Payer: Medicare PPO | Attending: Internal Medicine | Admitting: Physical Therapy

## 2019-08-12 ENCOUNTER — Other Ambulatory Visit: Payer: Self-pay

## 2019-08-12 DIAGNOSIS — I120 Hypertensive chronic kidney disease with stage 5 chronic kidney disease or end stage renal disease: Secondary | ICD-10-CM | POA: Diagnosis not present

## 2019-08-12 DIAGNOSIS — R310 Gross hematuria: Secondary | ICD-10-CM | POA: Diagnosis not present

## 2019-08-12 DIAGNOSIS — M47812 Spondylosis without myelopathy or radiculopathy, cervical region: Secondary | ICD-10-CM | POA: Diagnosis not present

## 2019-08-12 DIAGNOSIS — D631 Anemia in chronic kidney disease: Secondary | ICD-10-CM | POA: Diagnosis not present

## 2019-08-12 DIAGNOSIS — N200 Calculus of kidney: Secondary | ICD-10-CM | POA: Diagnosis not present

## 2019-08-12 DIAGNOSIS — E1122 Type 2 diabetes mellitus with diabetic chronic kidney disease: Secondary | ICD-10-CM | POA: Diagnosis not present

## 2019-08-12 DIAGNOSIS — M47816 Spondylosis without myelopathy or radiculopathy, lumbar region: Secondary | ICD-10-CM | POA: Diagnosis not present

## 2019-08-12 DIAGNOSIS — E11319 Type 2 diabetes mellitus with unspecified diabetic retinopathy without macular edema: Secondary | ICD-10-CM | POA: Diagnosis not present

## 2019-08-12 DIAGNOSIS — Z466 Encounter for fitting and adjustment of urinary device: Secondary | ICD-10-CM | POA: Diagnosis not present

## 2019-08-12 DIAGNOSIS — R338 Other retention of urine: Secondary | ICD-10-CM | POA: Diagnosis not present

## 2019-08-12 DIAGNOSIS — N186 End stage renal disease: Secondary | ICD-10-CM | POA: Diagnosis not present

## 2019-08-12 MED FILL — DOXYCYCLINE HYCLATE 100 MG: 100 | 3 days supply | Qty: 6 | Fill #0

## 2019-08-12 MED FILL — FINASTERIDE 5 MG TABLET: 5 | 30 days supply | Qty: 30 | Fill #0

## 2019-08-12 NOTE — Telephone Encounter (Signed)
TC to patient for [facility] Management follow up call:  No answer, voicemail obtained which was non-identifying, RN unable to leave message. SChaplin, RN,BSN

## 2019-08-12 NOTE — Telephone Encounter (Signed)
HH PT VO given 2x week for 4 weeks 1x week for 2 weeks 2x week for 2 weeks 1x week for 1 week For strengthening, balance, gait, safety  HHN will start tomorrow  FYI pt had blood in urine collection bag, he has appt this am at urology clinic  Do you agree?

## 2019-08-12 NOTE — Telephone Encounter (Signed)
Dennis Macias at [place]

## 2019-08-12 NOTE — Patient Outreach (Signed)
[facility]) Care Management  08/12/2019  Dennis Macias 1950-11-14 [id number]   Referral Date: 08/11/19 Referral Source: Hospital liaison Date of Admission: 08/04/19 Diagnosis:  Anasarca Date of Discharge: 08/10/19 Facility: [place]: [facility]  Outreach attempt: No answer.  HIPAA compliant voice message left.  RN CM will attempt patient within the next 4 business days.  [name], RN, MSN [place] Management Care Management Coordinator Direct Line [phone] Cell [phone] Toll Free: [phone]  Fax: [phone]

## 2019-08-13 ENCOUNTER — Ambulatory Visit (INDEPENDENT_AMBULATORY_CARE_PROVIDER_SITE_OTHER): Payer: Medicare PPO | Admitting: Internal Medicine

## 2019-08-13 ENCOUNTER — Encounter (HOSPITAL_COMMUNITY): Payer: Self-pay | Admitting: Emergency Medicine

## 2019-08-13 ENCOUNTER — Other Ambulatory Visit: Payer: Self-pay

## 2019-08-13 ENCOUNTER — Encounter: Payer: Self-pay | Admitting: Internal Medicine

## 2019-08-13 ENCOUNTER — Emergency Department (HOSPITAL_COMMUNITY)
Admission: EM | Admit: 2019-08-13 | Discharge: 2019-08-14 | Disposition: A | Discharge status: Home / Self Care | Payer: Medicare PPO | Attending: Emergency Medicine | Admitting: Emergency Medicine

## 2019-08-13 VITALS — BP 132/67 | HR 63 | Temp 98.6°F | Ht 72.0 in | Wt 181.1 lb

## 2019-08-13 DIAGNOSIS — R319 Hematuria, unspecified: Secondary | ICD-10-CM | POA: Insufficient documentation

## 2019-08-13 DIAGNOSIS — N200 Calculus of kidney: Secondary | ICD-10-CM | POA: Diagnosis not present

## 2019-08-13 DIAGNOSIS — M47816 Spondylosis without myelopathy or radiculopathy, lumbar region: Secondary | ICD-10-CM | POA: Diagnosis not present

## 2019-08-13 DIAGNOSIS — Z87891 Personal history of nicotine dependence: Secondary | ICD-10-CM | POA: Diagnosis not present

## 2019-08-13 DIAGNOSIS — I1310 Hypertensive heart and chronic kidney disease without heart failure, with stage 1 through stage 4 chronic kidney disease, or unspecified chronic kidney disease: Secondary | ICD-10-CM | POA: Insufficient documentation

## 2019-08-13 DIAGNOSIS — I120 Hypertensive chronic kidney disease with stage 5 chronic kidney disease or end stage renal disease: Secondary | ICD-10-CM | POA: Diagnosis not present

## 2019-08-13 DIAGNOSIS — E1122 Type 2 diabetes mellitus with diabetic chronic kidney disease: Secondary | ICD-10-CM | POA: Diagnosis not present

## 2019-08-13 DIAGNOSIS — I10 Essential (primary) hypertension: Secondary | ICD-10-CM

## 2019-08-13 DIAGNOSIS — M47812 Spondylosis without myelopathy or radiculopathy, cervical region: Secondary | ICD-10-CM | POA: Diagnosis not present

## 2019-08-13 DIAGNOSIS — I639 Cerebral infarction, unspecified: Secondary | ICD-10-CM | POA: Diagnosis not present

## 2019-08-13 DIAGNOSIS — D510 Vitamin B12 deficiency anemia due to intrinsic factor deficiency: Secondary | ICD-10-CM

## 2019-08-13 DIAGNOSIS — E119 Type 2 diabetes mellitus without complications: Secondary | ICD-10-CM | POA: Insufficient documentation

## 2019-08-13 DIAGNOSIS — D631 Anemia in chronic kidney disease: Secondary | ICD-10-CM | POA: Diagnosis not present

## 2019-08-13 DIAGNOSIS — R339 Retention of urine, unspecified: Secondary | ICD-10-CM | POA: Diagnosis not present

## 2019-08-13 DIAGNOSIS — N186 End stage renal disease: Secondary | ICD-10-CM | POA: Diagnosis not present

## 2019-08-13 DIAGNOSIS — N049 Nephrotic syndrome with unspecified morphologic changes: Secondary | ICD-10-CM | POA: Diagnosis not present

## 2019-08-13 DIAGNOSIS — N184 Chronic kidney disease, stage 4 (severe): Secondary | ICD-10-CM | POA: Insufficient documentation

## 2019-08-13 DIAGNOSIS — Z960 Presence of urogenital implants: Secondary | ICD-10-CM | POA: Diagnosis not present

## 2019-08-13 DIAGNOSIS — R310 Gross hematuria: Secondary | ICD-10-CM | POA: Diagnosis not present

## 2019-08-13 DIAGNOSIS — E11319 Type 2 diabetes mellitus with unspecified diabetic retinopathy without macular edema: Secondary | ICD-10-CM | POA: Diagnosis not present

## 2019-08-13 DIAGNOSIS — Z466 Encounter for fitting and adjustment of urinary device: Secondary | ICD-10-CM | POA: Diagnosis not present

## 2019-08-13 LAB — BASIC METABOLIC PANEL
Anion gap: 9 (ref 5–15)
BUN: 34 mg/dL — ABNORMAL HIGH (ref 8–23)
CO2: 21 mmol/L — ABNORMAL LOW (ref 22–32)
Calcium: 8.3 mg/dL — ABNORMAL LOW (ref 8.9–10.3)
Chloride: 105 mmol/L (ref 98–111)
Creatinine, Ser: 2.63 mg/dL — ABNORMAL HIGH (ref 0.61–1.24)
GFR calc Af Amer: 28 mL/min — ABNORMAL LOW (ref >60)
GFR calc non Af Amer: 24 mL/min — ABNORMAL LOW (ref >60)
Glucose, Bld: 129 mg/dL — ABNORMAL HIGH (ref 70–99)
Potassium: 4.4 mmol/L (ref 3.5–5.1)
Sodium: 135 mmol/L (ref 135–145)

## 2019-08-13 LAB — CBC WITH DIFFERENTIAL/PLATELET
Abs Immature Granulocytes: 0.01 10*3/uL (ref 0.00–0.07)
Basophils Absolute: 0 10*3/uL (ref 0.0–0.1)
Basophils Relative: 1 %
Eosinophils Absolute: 0.1 10*3/uL (ref 0.0–0.5)
Eosinophils Relative: 2 %
HCT: 23 % — ABNORMAL LOW (ref 39.0–52.0)
Hemoglobin: 7.6 g/dL — ABNORMAL LOW (ref 13.0–17.0)
Immature Granulocytes: 0 %
Lymphocytes Relative: 23 %
Lymphs Abs: 1.4 10*3/uL (ref 0.7–4.0)
MCH: 30.3 pg (ref 26.0–34.0)
MCHC: 33 g/dL (ref 30.0–36.0)
MCV: 91.6 fL (ref 80.0–100.0)
Monocytes Absolute: 0.4 10*3/uL (ref 0.1–1.0)
Monocytes Relative: 7 %
Neutro Abs: 4.1 10*3/uL (ref 1.7–7.7)
Neutrophils Relative %: 67 %
Platelets: 322 10*3/uL (ref 150–400)
RBC: 2.51 MIL/uL — ABNORMAL LOW (ref 4.22–5.81)
RDW: 14.3 % (ref 11.5–15.5)
WBC: 6 10*3/uL (ref 4.0–10.5)
nRBC: 0 % (ref 0.0–0.2)

## 2019-08-13 NOTE — Progress Notes (Signed)
Internal Medicine Clinic Attending  Case discussed with Dr. Agyei at the time of the visit.  We reviewed the resident's history and exam and pertinent patient test results.  I agree with the assessment, diagnosis, and plan of care documented in the resident's note.    

## 2019-08-13 NOTE — ED Triage Notes (Signed)
Pt reports that his catheter is not draining. Family reports pt had catheter placed for urinary retention, 2 nights ago pt noted his urine was bright red, followed up with his urologist who placed a larger catheter and suctioned clots out. Family states they emptied his catheter today at 1330 and pt has had no urinary output since.

## 2019-08-13 NOTE — Progress Notes (Signed)
   CC: Hospital follow-up, CKD stage IV  HPI:  [name] is a 69 y.o. with medical history significant for CKD stage IV here to follow-up after recent hospital visit.  Please see problem based charting for further details  Past Medical History:  Diagnosis Date  . Anemia   . Arthritis    past hx   . Blindness    right eye  . Cataract    removed both eyes  . Dehydration   . Diabetes ([place])   . Glaucoma   . History of CVA (cerebrovascular accident) 09/13/2015  . History of urinary retention   . Hyperlipidemia   . Hypertension   . Pernicious anemia 02/24/2018  . Stroke [facility])    2017- March  . Tachycardia 08/26/2017  . Tubular adenoma of colon 02/2017  . Weight loss, non-intentional 08/26/2017   10 lbs between 6/18 & 2/19   Review of Systems:  As per HPI  Physical Exam:  Vitals:   08/13/19 1105  BP: 132/67  Pulse: 63  Temp: 98.6 F (37 C)  TempSrc: Oral  SpO2: 100%  Weight: 181 lb 1.6 oz (82.1 kg)  Height: 6' (1.829 m)   Physical Exam  Constitutional: He is well-developed, well-nourished, and in no distress.  Cardiovascular: Normal rate, regular rhythm and normal heart sounds.  Pulmonary/Chest: Effort normal and breath sounds normal. No respiratory distress. He has no wheezes.  Genitourinary:    Genitourinary Comments: Foley catheter in place, hematuria observed in foley bag     Assessment & Plan:   See Encounters Tab for problem based charting.  Patient discussed with Dr. Philipp Ovens

## 2019-08-13 NOTE — ED Notes (Signed)
Noted urinary output @ 400 ml; catheter draining slowly.

## 2019-08-13 NOTE — Assessment & Plan Note (Signed)
CKD stage IV, nephrotic syndrome, anasarca: He was recently admitted to the hospital from 08/04/2019-08/10/2019 with anasarca and worsening renal function.  I actually evaluated him in the clinic prior to admission and he was noted to be seeping fluid from his skin bilaterally at the upper and lower extremities.  He was treated with IV Lasix and Unna boots.  His weight on admission was 193 pounds.  His weight today is 181 pounds. He was subsequently discharged on p.o. Lasix 40 mg twice daily.  Though he has up about 20 pounds from his weight from 3 days ago, clinically he has significantly improved with no evidence of lower extremity edema, upper extremity edema.  He denies dyspnea on exertion, orthopnea or symptomatology of volume overload.  Plan: -Continue Lasix 40 mg twice daily -Repeat BMP today -Follow-up with Dr. Hollie Salk (nephrology)

## 2019-08-13 NOTE — ED Provider Notes (Signed)
[facility] DE[facility]MENT Provider Note   CSN: [id number] Arrival date & time: 08/13/19  1959     History Chief Complaint  Patient presents with  . Hematuria  . Urinary Retention    Dennis Macias is a 69 y.o. male with past medical history significant for CKD stage IV, hypertension, type 2 diabetes, CVA, hyperlipidemia presents to emergency department today with chief complaint of hematuria and urinary retention x3 days.  Patient was recently admitted to the hospital on 08/04/2019-08/10/2019 with anasarca and worsening renal function. Prior to admission he had fluid seeping from bilateral upper and lower extremities. He was ultimately discharged home and prescribed 71m PO Lasix daily. Lower extremity edema has improved. Patient reports compliance with medications. He went to urology yesterday saw Dr. WJeffie Pollockand had catheter irrigated, multiple large clots passed, and catheter was exchanged for larger size. Patient went home, still had gross hematuria that looked improved per family. Then today patient had urinary retention and no urine output from 1:30 pm until ED arrival at 8pm. Patient denies any fever, chills, chest pain, shortness of breath, abdominal pain, nausea, vomiting, back pain, flank pain.  History provided by patient and family  with additional history obtained from chart review.     Past Medical History:  Diagnosis Date  . Anemia   . Arthritis    past hx   . Blindness    right eye  . Cataract    removed both eyes  . Dehydration   . [place]   . Glaucoma   . History of CVA (cerebrovascular accident) 09/13/2015  . History of urinary retention   . Hyperlipidemia   . Hypertension   . Pernicious anemia 02/24/2018  . Stroke ([facility]    2017- March  . Tachycardia 08/26/2017  . Tubular adenoma of colon 02/2017  . Weight loss, non-intentional 08/26/2017   10 lbs between 6/18 & 2/19    Patient Active Problem List   Diagnosis Date Noted  .  Hematuria, unspecified 08/13/2019  . Hypomagnesemia 08/07/2019  . Enlarged prostate 08/05/2019  . Anasarca associated with disorder of kidney 08/05/2019  . Urinary retention 08/01/2019  . Accident due to mechanical fall without injury 07/31/2019  . Protein calorie malnutrition ([facility] 07/31/2019  . Left renal mass 07/21/2019  . CKD (chronic kidney disease) stage 4, GFR 15-29 ml/min (HCC) 04/23/2019  . Hip weakness 08/13/2018  . Pernicious anemia 02/24/2018  . Elevated PSA 10/22/2017  . Weight loss, non-intentional 08/26/2017  . 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 BPH with obstruction/lower urinary tract symptoms   . Normocytic anemia 12/21/2016  . 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 Hyperlipidemia 09/13/2015  . Diabetes mellitus with retinopathy of both [place] ([place]   . Essential hypertension     Past Surgical History:  Procedure Laterality Date  . CATARACT EXTRACTION, BILATERAL    . COLONOSCOPY    . POLYPECTOMY    . REFRACTIVE SURGERY  10/2017  . TEE WITHOUT CARDIOVERSION N/A 09/14/2015   Procedure: TRANSESOPHAGEAL ECHOCARDIOGRAM (TEE);  Surgeon: DLarey Dresser MD;  [facility]  Service: Cardiovascular;  Laterality: N/A;  . UPPER GASTROINTESTINAL ENDOSCOPY         Family History  Problem Relation Age of Onset  . Hypertension Mother   . Hyperlipidemia Mother   . Hyperlipidemia Father   . Colon cancer Neg Hx   . Colon polyps Neg Hx   . Esophageal cancer Neg Hx   . Rectal cancer Neg Hx   .  Stomach cancer Neg Hx     Social History   Tobacco Use  . Smoking status: Former Research scientist (life sciences)  . Smokeless tobacco: Former Systems developer    Types: Chew    Quit date: 07/09/1978  . Tobacco comment: quit 1 year ago  Substance Use Topics  . Alcohol use: No    Alcohol/week: 1.0 standard drinks    Types: 1 Cans of beer per week    Comment: quit last march/2017  . Drug use: No    Home Medications Prior to Admission medications   Medication Sig Start Date  End Date Taking? Authorizing Provider  amLODipine (NORVASC) 10 MG tablet Take 1 tablet (10 mg total) by mouth daily. 07/22/19  Yes [name], Rylee, MD  atorvastatin (LIPITOR) 80 MG tablet Take 1 tablet (80 mg total) by mouth at bedtime. IM program 07/22/19  Yes [name], Rylee, MD  dorzolamide-timolol (COSOPT) 22.3-6.8 MG/ML ophthalmic solution Place 1 drop into the right eye 2 (two) times daily.  01/16/18  Yes [provider]  doxycycline (VIBRA-TABS) 100 MG tablet Take 100 mg by mouth 2 (two) times daily. 08/12/19  Yes [provider]  finasteride (PROSCAR) 5 MG tablet Take 5 mg by mouth daily. 08/12/19  Yes [provider]  furosemide (LASIX) 40 MG tablet Take 1 tablet (40 mg total) by mouth 2 (two) times daily. 08/10/19 08/09/20 Yes Aslam, Loralyn Freshwater, MD  metoprolol tartrate (LOPRESSOR) 25 MG tablet Take 0.5 tablets (12.5 mg total) by mouth 2 (two) times daily. 07/15/19 08/14/19 Yes [name], Rylee, MD  sodium bicarbonate 650 MG tablet Take 650 mg by mouth 2 (two) times daily.   Yes [provider]  tamsulosin (FLOMAX) 0.4 MG CAPS capsule Take 1 capsule (0.4 mg total) by mouth daily after supper. 08/02/19  Yes Agyei, Caprice Kluver, MD  vitamin B-12 (CYANOCOBALAMIN) 1000 MCG tablet Take 1,000 mcg by mouth daily.   Yes [provider]  ACCU-CHEK FASTCLIX LANCETS MISC Check blood sugar up to 7 times a week as instructed 08/13/18   Isabelle Course, MD  aspirin 81 MG tablet Take 1 tablet (81 mg total) by mouth daily. Internal Medicine Program place on hold until patient requests to fill Patient not taking: Reported on 08/13/2019 01/27/16   Jule Ser, DO  Blood Glucose Monitoring Suppl (ACCU-CHEK GUIDE) w/Device KIT 1 each by Does not apply route daily. Check blood sugar as instructed up to 7 times a week 08/13/18   Isabelle Course, MD  glucose blood (ACCU-CHEK GUIDE) test strip Check blood sugar up to 7 times a week as instructed 08/13/18   Isabelle Course, MD    Allergies    Patient  has no known allergies.  Review of Systems   Review of Systems  All other systems are reviewed and are negative for acute change except as noted in the HPI.   Physical Exam Updated Vital Signs BP 134/76 (BP Location: Right Arm)   Pulse 72   Temp 98.5 F (36.9 C) (Oral)   Resp 18   SpO2 100%   Physical Exam Vitals and nursing note reviewed.  Constitutional:      General: He is not in acute distress.    Appearance: He is not ill-appearing.  HENT:     Head: Normocephalic and atraumatic.     Right Ear: Tympanic membrane and external ear normal.     Left Ear: Tympanic membrane and external ear normal.     Nose: Nose normal.     Mouth/Throat:  Mouth: Mucous membranes are moist.     Pharynx: Oropharynx is clear.  Eyes:     General: No scleral icterus.       Right eye: No discharge.        Left eye: No discharge.     Extraocular Movements: Extraocular movements intact.     Conjunctiva/sclera: Conjunctivae normal.     Pupils: Pupils are equal, round, and reactive to light.  Neck:     Vascular: No JVD.  Cardiovascular:     Rate and Rhythm: Normal rate and regular rhythm.     Pulses: Normal pulses.          Radial pulses are 2+ on the right side and 2+ on the left side.     Heart sounds: Normal heart sounds.  Pulmonary:     Comments: Lungs clear to auscultation in all fields. Symmetric chest rise. No wheezing, rales, or rhonchi. Abdominal:     Tenderness: There is no right CVA tenderness or left CVA tenderness.     Comments: Abdomen is soft, non-distended, and non-tender in all quadrants. No rigidity, no guarding. No peritoneal signs.  Genitourinary:    Comments:  Chaperone RN Louie present for exam.  Foley cather in place. No discharge, bleeding or urethritis noted. No signs of sores or lesions or erythema on the penis or testicles. The penis and testicles are nontender. No testicular masses or swelling. No scrotal swelling. No signs of any inguinal hernias. Cremaster  reflex present bilaterally.   Musculoskeletal:        General: Normal range of motion.     Cervical back: Normal range of motion.  Skin:    General: Skin is warm and dry.     Capillary Refill: Capillary refill takes less than 2 seconds.  Neurological:     Mental Status: He is oriented to person, place, and time.     GCS: GCS eye subscore is 4. GCS verbal subscore is 5. GCS motor subscore is 6.     Comments: Fluent speech, no facial droop.  Psychiatric:        Behavior: Behavior normal.         ED Results / Procedures / Treatments   Labs (all labs ordered are listed, but only abnormal results are displayed) Labs Reviewed  BASIC METABOLIC PANEL - Abnormal; Notable for the following components:      Result Value   CO2 21 (*)    Glucose, Bld 129 (*)    BUN 34 (*)    Creatinine, Ser 2.63 (*)    Calcium 8.3 (*)    GFR calc non Af Amer 24 (*)    GFR calc Af Amer 28 (*)    All other components within normal limits  CBC WITH DIFFERENTIAL/PLATELET - Abnormal; Notable for the following components:   RBC 2.51 (*)    Hemoglobin 7.6 (*)    HCT 23.0 (*)    All other components within normal limits    EKG None  Radiology No results found.  Procedures Procedures (including critical care time)  Medications Ordered in ED Medications - No data to display  ED Course  I have reviewed the triage vital signs and the nursing notes.  Pertinent labs & imaging results that were available during my care of the patient were reviewed by me and considered in my medical decision making (see chart for details).   MDM Rules/Calculators/A&P  Patient seen and examined. Patient presents awake, alert, hemodynamically stable, afebrile, non toxic. No abdominal tenderness, no peritoneal signs. Foley catheter in place without signs of infection, recently changed by urology yesterday.  RN irrigated foley twice, both times with bright red blood. No clots seen. No signs of  obstruction. Basic labs ordered to check hemoglobin, creatinine as patient as patient had them drawn earlier by pcp but results not showing in Epic yet and family is very concerned with amount of hematuria seen.  CBC with hemoglobin of 7.6, consistent with patient's baseline BMP shows kidney function slightly improved from x4 days ago.  Given reassuring exam, labs, foley catheter irrigated and without obstruction patient can be discharged home.  The patient appears reasonably screened and/or stabilized for discharge and I doubt any other medical condition or other [facility] requiring further screening, evaluation, or treatment in the ED at this time prior to discharge. The patient is safe for discharge with strict return precautions discussed. Recommend urology follow up if symptoms persist. Findings and plan of care discussed with supervising physician Dr. Johnney Killian.      Portions of this note were generated with [employer]. Dictation errors may occur despite best attempts at proofreading.    Final Clinical Impression(s) / ED Diagnoses Final diagnoses:  Hematuria, unspecified type    Rx / DC Orders ED Discharge Orders    None       Cherre Robins, PA-C 08/14/19 0046    Charlesetta Shanks, MD 08/17/19 1451

## 2019-08-13 NOTE — Patient Outreach (Signed)
[facility]) Care Management  08/13/2019  Elder Davidian 12/18/1950 [id number]   Referral Date:08/11/19 Referral Source:Hospital liaison Date of Admission:08/04/19 Diagnosis:Anasarca Date of Discharge:08/10/19 Facility:[place] Insurance:[employer]  Outreach attempt:No answer. HIPAA compliant voice message left.   Plan: RN CM will wait return call.  If no return call will close case.  [name], RN, MSN [place] Management Care Management Coordinator Direct Line [phone] Cell [phone] Toll Free: [phone]  Fax: [phone]

## 2019-08-13 NOTE — Patient Instructions (Signed)
Mr. Boardley,   I am glad to hear that you are doing well.   1)Please follow with the Urologist and Dr. Hollie Salk 2)I am checking lab work today  Take care!

## 2019-08-13 NOTE — Assessment & Plan Note (Signed)
Concern for urological malignancy: Occasional, he followed up with urology and the daughter tells me today that they "flushed out his bladder.  "I am under the impression that he underwent a cystoscopy.  Day after the procedure, he began experiencing hematuria and patient's wife called 911 who advised him to follow-up with his PCP.  He followed up with urology on August 12, 2019 and his urethral catheter was exchanged.  He still has hematuria in his Foley bag.  He has follow-up with urology on Monday.  He denies dizziness or lightheadedness.  Plan: -Follow-up with urology -Follow-up CBC

## 2019-08-13 NOTE — Telephone Encounter (Signed)
Pt's daughter sends my chart message states that the catheter bag has been dry since appr 1300. Pt is not c/o pain at this time, she cannot tell nurse if lower abd is distended. States that the catheter in the last 24hrs has been iriigated due to large amt of blood. But as she stated nothing is coming out into bag, states it is dry.  She is advised to bring pt to [facility] assessment

## 2019-08-14 DIAGNOSIS — E1122 Type 2 diabetes mellitus with diabetic chronic kidney disease: Secondary | ICD-10-CM | POA: Diagnosis not present

## 2019-08-14 DIAGNOSIS — D631 Anemia in chronic kidney disease: Secondary | ICD-10-CM | POA: Diagnosis not present

## 2019-08-14 DIAGNOSIS — N186 End stage renal disease: Secondary | ICD-10-CM | POA: Diagnosis not present

## 2019-08-14 DIAGNOSIS — E11319 Type 2 diabetes mellitus with unspecified diabetic retinopathy without macular edema: Secondary | ICD-10-CM | POA: Diagnosis not present

## 2019-08-14 DIAGNOSIS — R338 Other retention of urine: Secondary | ICD-10-CM | POA: Diagnosis not present

## 2019-08-14 DIAGNOSIS — Z466 Encounter for fitting and adjustment of urinary device: Secondary | ICD-10-CM | POA: Diagnosis not present

## 2019-08-14 DIAGNOSIS — N200 Calculus of kidney: Secondary | ICD-10-CM | POA: Diagnosis not present

## 2019-08-14 DIAGNOSIS — R310 Gross hematuria: Secondary | ICD-10-CM | POA: Diagnosis not present

## 2019-08-14 DIAGNOSIS — M47812 Spondylosis without myelopathy or radiculopathy, cervical region: Secondary | ICD-10-CM | POA: Diagnosis not present

## 2019-08-14 DIAGNOSIS — I120 Hypertensive chronic kidney disease with stage 5 chronic kidney disease or end stage renal disease: Secondary | ICD-10-CM | POA: Diagnosis not present

## 2019-08-14 DIAGNOSIS — M47816 Spondylosis without myelopathy or radiculopathy, lumbar region: Secondary | ICD-10-CM | POA: Diagnosis not present

## 2019-08-14 LAB — CBC
Hematocrit: 22.3 % — ABNORMAL LOW (ref 37.5–51.0)
Hemoglobin: 7.7 g/dL — ABNORMAL LOW (ref 13.0–17.7)
MCH: 29.8 pg (ref 26.6–33.0)
MCHC: 34.5 g/dL (ref 31.5–35.7)
MCV: 86 fL (ref 79–97)
Platelets: 392 10*3/uL (ref 150–450)
RBC: 2.58 x10E6/uL — CL (ref 4.14–5.80)
RDW: 14.4 % (ref 11.6–15.4)
WBC: 5.6 10*3/uL (ref 3.4–10.8)

## 2019-08-14 LAB — BMP8+ANION GAP
Anion Gap: 13 mmol/L (ref 10.0–18.0)
BUN/Creatinine Ratio: 14 (ref 10–24)
BUN: 36 mg/dL — ABNORMAL HIGH (ref 8–27)
CO2: 20 mmol/L (ref 20–29)
Calcium: 8.5 mg/dL — ABNORMAL LOW (ref 8.6–10.2)
Chloride: 107 mmol/L — ABNORMAL HIGH (ref 96–106)
Creatinine, Ser: 2.53 mg/dL — ABNORMAL HIGH (ref 0.76–1.27)
GFR calc Af Amer: 29 mL/min/{1.73_m2} — ABNORMAL LOW (ref >59)
GFR calc non Af Amer: 25 mL/min/{1.73_m2} — ABNORMAL LOW (ref >59)
Glucose: 127 mg/dL — ABNORMAL HIGH (ref 65–99)
Potassium: 4.7 mmol/L (ref 3.5–5.2)
Sodium: 140 mmol/L (ref 134–144)

## 2019-08-14 NOTE — Discharge Instructions (Addendum)
You have been seen today for blood in urine. Please read and follow all provided instructions. Return to the emergency room for worsening condition or new concerning symptoms.    1. Medications:  Continue usual home medications Take medications as prescribed. Please review all of the medicines and only take them if you do not have an allergy to them.   2. Treatment: rest, continue to elevate your legs to help with swelling.  3. Follow Up:  Please follow up with urology to for symptom recheck  ?

## 2019-08-17 DIAGNOSIS — R338 Other retention of urine: Secondary | ICD-10-CM | POA: Diagnosis not present

## 2019-08-17 DIAGNOSIS — R310 Gross hematuria: Secondary | ICD-10-CM | POA: Diagnosis not present

## 2019-08-17 NOTE — Telephone Encounter (Signed)
I agree

## 2019-08-18 ENCOUNTER — Encounter: Payer: Medicare PPO | Admitting: Internal Medicine

## 2019-08-18 ENCOUNTER — Encounter: Payer: Self-pay | Admitting: Internal Medicine

## 2019-08-18 NOTE — Telephone Encounter (Signed)
Returned call to [employer] wife. Had spoke to her this morning regarding his appointment today. He was recently seen in our clinic on 2/4 for hospital follow up. He has been going to several doctor appointments since that time and I was unsure if there was anything to discuss or for me to offer for today's visit since it had been made prior to hospital follow up.  His wife did not think there would be anything in particular for [place] to discuss today and he is returning to the urologist tomorrow so she would appreciate [place] canceling the appointment for today. He also has an appointment with Dr. Hollie Salk from nephrology later this week. Discussed that we will play it by ear depending on his appointments that he has coming up and that I would see them in 2-4w unless anything changes in the meantime.  She expressed appreciation for this accomodation and had no further questions at this time.  The following are some of the items we discussed as well.  1. BPH with bladder outlet obstruction requiring chronic foley catheterization. Chronic catheter use began during last hospitalization 1/26-2/1. Patient did experience a few days of hematuria and subsequent clot obstruction of the catheter for which he visited the ED on 2/4. Hematuria has since resolved. [facility] follows with [facility] and had an appointment yesterday, 2/8. Unfortunately, we do not have those records available at today's appointment however patient's wife notes that they are planning for a urodynamic study in March.  Last PSA in Jan 2020 actually showed a downtrend from 2019--5.3>>3.4. Free PSA 40% which is also reassuring. Plan -will continue to have him follow with urology. Appreciate their assistance. Planning for urodynamic testing in march -will try to obtain the records from yesterday's appt for further insight.  2. Chronic hypertension. Chronic CKD IV.  At the last few visits, patient's blood pressure was fairly elevated which was likely  exacerbated by his hypervolemic state. During his hospitalization, blood pressures improved with diuresis. His renal function had also been declining over the past few months however this also appeared to improve over the course of his hospitalization. I would attribute this to the diuresis in addition to the foley placement to relieve the BOO.   3. Left renal mass. Initially detected on a renal US back in January. During his most recent hospitalization, he underwent an MRI of his abdomen which was, unfortunately, limited due to motion artifact and severe anasarca. No renal lesion could be appreciated at the time of the scan however due to its motion artifact, it's recommended that the exam be repeated in 3 months. Additionally, signs of iron deposition were appreciated in the spleen and liver. I am unsure what to make of this however will hopefully be better evaluated at next exam. Plan -will get a repeat MRI sometime in April for re-evaluation.  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 I had referred him to dermatology who recommended having him referred to ENT for botox injections for this.

## 2019-08-18 NOTE — Assessment & Plan Note (Deleted)
2. Chronic hypertension. Chronic CKD IV.  At the last few visits, patient's blood pressure was fairly elevated which was likely exacerbated by his hypervolemic state. During his hospitalization, blood pressures improved with diuresis. His renal function had also been declining over the past few months however this also appeared to improve over the course of his hospitalization. I would attribute this to the diuresis in addition to the foley placement to relieve the BOO.  Plan -repeat BMP today

## 2019-08-18 NOTE — Assessment & Plan Note (Deleted)
1. BPH with bladder outlet obstruction requiring chronic foley catheterization. Chronic catheter use began during last hospitalization 1/26-2/1. Patient did experience a few days of hematuria and subsequent clot obstruction of the catheter for which he visited the ED on 2/4. Hematuria has since resolved. [facility] follows with [facility] and had an appointment yesterday, 2/8. Unfortunately, we do not have those records available at today's appointment however patient's wife notes that they are planning for a urodynamic study in March.  Last PSA in Jan 2020 actually showed a downtrend from 2019--5.3>>3.4. Free PSA 40% which is also reassuring. Plan -will continue to have him follow with urology. Appreciate their assistance. Planning for urodynamic testing in march -will try to obtain the records from yesterday's appt for further insight.

## 2019-08-18 NOTE — Progress Notes (Deleted)
CC: BPH with obstruction requiring urinary catheter, CKD IV, anasarca, chronic hypertension  HPI:  [name] is a 69 y.o. male who presents for hospital and ED follow up. Hospitalized 1/26-21-08/10/19 for anasarca, severe BPH requiring foley catheter at discharge, and progressive renal failure. Returned to the ED on 08/13/19 for hematuria and diminished UOP.  Please see problem based assessment and plan for additional details.     Past Medical History:  Diagnosis Date  . Anemia   . Arthritis    past hx   . Blindness    right eye  . Cataract    removed both eyes  . Dehydration   . Diabetes ([place])   . Glaucoma   . History of CVA (cerebrovascular accident) 09/13/2015  . History of urinary retention   . Hyperlipidemia   . Hypertension   . Pernicious anemia 02/24/2018  . Stroke [facility])    2017- March  . Tachycardia 08/26/2017  . Tubular adenoma of colon 02/2017  . Weight loss, non-intentional 08/26/2017   10 lbs between 6/18 & 2/19    Review of Systems:  Review of Systems - General ROS: positive for  - weight loss Genito-Urinary ROS: positive for - hematuria Musculoskeletal ROS: positive for - muscular weakness   Physical Exam:  There were no vitals filed for this visit.  GENERAL: well appearing, in no apparent distress HEENT: no conjunctival injection. Nares patent.  CARDIAC: heart regular rate and rhythm, no peripheral edema appreciated PULMONARY: lung sounds clear to auscultation ABDOMEN: bowel sounds active.  SKIN: no rash or lesion on limited exam NEURO: CN II-XII grossly intact   Assessment & Plan:   1. BPH with bladder outlet obstruction requiring chronic foley catheterization. Chronic catheter use began during last hospitalization 1/26-2/1. Patient did experience a few days of hematuria and subsequent clot obstruction of the catheter for which he visited the ED on 2/4. Hematuria has since resolved. [facility] follows with [facility] and had an appointment  yesterday, 2/8. Unfortunately, we do not have those records available at today's appointment however patient's wife notes that they are planning for a urodynamic study in March.  Last PSA in Jan 2020 actually showed a downtrend from 2019--5.3>>3.4. Free PSA 40% which is also reassuring. Plan -will continue to have him follow with urology. Appreciate their assistance. Planning for urodynamic testing in march -will try to obtain the records from yesterday's appt for further insight.  2. Chronic hypertension. Chronic CKD IV.  At the last few visits, patient's blood pressure was fairly elevated which was likely exacerbated by his hypervolemic state. During his hospitalization, blood pressures improved with diuresis. His renal function had also been declining over the past few months however this also appeared to improve over the course of his hospitalization. I would attribute this to the diuresis in addition to the foley placement to relieve the BOO.  Plan -repeat BMP today  3. Left renal mass. Initially detected on a renal US back in January. During his most recent hospitalization, he underwent an MRI of his abdomen which was, unfortunately, limited due to motion artifact and severe anasarca. No renal lesion could be appreciated at the time of the scan however due to its motion artifact, it's recommended that the exam be repeated in 3 months. Additionally, signs of iron deposition were appreciated in the spleen and liver. I am unsure what to make of this however will hopefully be better evaluated at next exam. Plan -will get a repeat MRI sometime in April for  re-evaluation.  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 I had referred him to dermatology who recommended having him referred to ENT for botox injections for this.    Patient is in agreement with the plan and endorses no further questions at this time.  Patient discussed with Dr.  Illa Level. Hoffman","Mullen","Narendra","Raines","Vincent"}  [name], MD Internal Medicine Resident-PGY1 08/18/19

## 2019-08-19 DIAGNOSIS — Z466 Encounter for fitting and adjustment of urinary device: Secondary | ICD-10-CM | POA: Diagnosis not present

## 2019-08-19 DIAGNOSIS — R338 Other retention of urine: Secondary | ICD-10-CM | POA: Diagnosis not present

## 2019-08-19 DIAGNOSIS — N186 End stage renal disease: Secondary | ICD-10-CM | POA: Diagnosis not present

## 2019-08-19 DIAGNOSIS — E11319 Type 2 diabetes mellitus with unspecified diabetic retinopathy without macular edema: Secondary | ICD-10-CM | POA: Diagnosis not present

## 2019-08-19 DIAGNOSIS — D631 Anemia in chronic kidney disease: Secondary | ICD-10-CM | POA: Diagnosis not present

## 2019-08-19 DIAGNOSIS — M47812 Spondylosis without myelopathy or radiculopathy, cervical region: Secondary | ICD-10-CM | POA: Diagnosis not present

## 2019-08-19 DIAGNOSIS — I120 Hypertensive chronic kidney disease with stage 5 chronic kidney disease or end stage renal disease: Secondary | ICD-10-CM | POA: Diagnosis not present

## 2019-08-19 DIAGNOSIS — M47816 Spondylosis without myelopathy or radiculopathy, lumbar region: Secondary | ICD-10-CM | POA: Diagnosis not present

## 2019-08-19 DIAGNOSIS — N401 Enlarged prostate with lower urinary tract symptoms: Secondary | ICD-10-CM | POA: Diagnosis not present

## 2019-08-19 DIAGNOSIS — N200 Calculus of kidney: Secondary | ICD-10-CM | POA: Diagnosis not present

## 2019-08-19 DIAGNOSIS — E1122 Type 2 diabetes mellitus with diabetic chronic kidney disease: Secondary | ICD-10-CM | POA: Diagnosis not present

## 2019-08-20 DIAGNOSIS — R338 Other retention of urine: Secondary | ICD-10-CM | POA: Diagnosis not present

## 2019-08-21 DIAGNOSIS — E872 Acidosis: Secondary | ICD-10-CM | POA: Diagnosis not present

## 2019-08-21 DIAGNOSIS — R29898 Other symptoms and signs involving the musculoskeletal system: Secondary | ICD-10-CM | POA: Diagnosis not present

## 2019-08-21 DIAGNOSIS — N184 Chronic kidney disease, stage 4 (severe): Secondary | ICD-10-CM | POA: Diagnosis not present

## 2019-08-21 DIAGNOSIS — D631 Anemia in chronic kidney disease: Secondary | ICD-10-CM | POA: Diagnosis not present

## 2019-08-21 DIAGNOSIS — N2889 Other specified disorders of kidney and ureter: Secondary | ICD-10-CM | POA: Diagnosis not present

## 2019-08-21 DIAGNOSIS — N189 Chronic kidney disease, unspecified: Secondary | ICD-10-CM | POA: Diagnosis not present

## 2019-08-21 DIAGNOSIS — I129 Hypertensive chronic kidney disease with stage 1 through stage 4 chronic kidney disease, or unspecified chronic kidney disease: Secondary | ICD-10-CM | POA: Diagnosis not present

## 2019-08-24 ENCOUNTER — Encounter: Payer: Self-pay | Admitting: Internal Medicine

## 2019-08-24 ENCOUNTER — Other Ambulatory Visit: Payer: Self-pay | Admitting: Internal Medicine

## 2019-08-24 DIAGNOSIS — N200 Calculus of kidney: Secondary | ICD-10-CM | POA: Diagnosis not present

## 2019-08-24 DIAGNOSIS — H4051X3 Glaucoma secondary to other eye disorders, right eye, severe stage: Secondary | ICD-10-CM | POA: Diagnosis not present

## 2019-08-24 DIAGNOSIS — E11319 Type 2 diabetes mellitus with unspecified diabetic retinopathy without macular edema: Secondary | ICD-10-CM | POA: Diagnosis not present

## 2019-08-24 DIAGNOSIS — M47812 Spondylosis without myelopathy or radiculopathy, cervical region: Secondary | ICD-10-CM | POA: Diagnosis not present

## 2019-08-24 DIAGNOSIS — Z466 Encounter for fitting and adjustment of urinary device: Secondary | ICD-10-CM | POA: Diagnosis not present

## 2019-08-24 DIAGNOSIS — E113521 Type 2 diabetes mellitus with proliferative diabetic retinopathy with traction retinal detachment involving the macula, right eye: Secondary | ICD-10-CM | POA: Diagnosis not present

## 2019-08-24 DIAGNOSIS — H35372 Puckering of macula, left eye: Secondary | ICD-10-CM | POA: Diagnosis not present

## 2019-08-24 DIAGNOSIS — M47816 Spondylosis without myelopathy or radiculopathy, lumbar region: Secondary | ICD-10-CM | POA: Diagnosis not present

## 2019-08-24 DIAGNOSIS — I120 Hypertensive chronic kidney disease with stage 5 chronic kidney disease or end stage renal disease: Secondary | ICD-10-CM | POA: Diagnosis not present

## 2019-08-24 DIAGNOSIS — E113512 Type 2 diabetes mellitus with proliferative diabetic retinopathy with macular edema, left eye: Secondary | ICD-10-CM | POA: Diagnosis not present

## 2019-08-24 DIAGNOSIS — E1122 Type 2 diabetes mellitus with diabetic chronic kidney disease: Secondary | ICD-10-CM | POA: Diagnosis not present

## 2019-08-24 DIAGNOSIS — N186 End stage renal disease: Secondary | ICD-10-CM | POA: Diagnosis not present

## 2019-08-24 DIAGNOSIS — D631 Anemia in chronic kidney disease: Secondary | ICD-10-CM | POA: Diagnosis not present

## 2019-08-24 LAB — HM DIABETES EYE EXAM

## 2019-08-24 MED FILL — METOPROLOL TARTRATE 25 MG T: 25 | 30 days supply | Qty: 30 | Fill #1

## 2019-08-25 ENCOUNTER — Other Ambulatory Visit: Payer: Self-pay

## 2019-08-25 DIAGNOSIS — R29898 Other symptoms and signs involving the musculoskeletal system: Secondary | ICD-10-CM | POA: Insufficient documentation

## 2019-08-25 DIAGNOSIS — G8220 Paraplegia, unspecified: Secondary | ICD-10-CM | POA: Insufficient documentation

## 2019-08-25 DIAGNOSIS — I10 Essential (primary) hypertension: Secondary | ICD-10-CM | POA: Diagnosis not present

## 2019-08-25 NOTE — Patient Outreach (Signed)
[facility]) Care Management  08/25/2019  Dennis Macias [birth year] [id number]   Multiple attempts to establish contact with patient without success. No response from letter mailed to patient.   Plan: RN CM will close case at this time.   [name], RN, MSN [facility] Care Management Coordinator Direct Line [phone] Cell [phone] Toll Free: [phone]  Fax: [phone]

## 2019-08-26 DIAGNOSIS — I120 Hypertensive chronic kidney disease with stage 5 chronic kidney disease or end stage renal disease: Secondary | ICD-10-CM | POA: Diagnosis not present

## 2019-08-26 DIAGNOSIS — Z466 Encounter for fitting and adjustment of urinary device: Secondary | ICD-10-CM | POA: Diagnosis not present

## 2019-08-26 DIAGNOSIS — M47816 Spondylosis without myelopathy or radiculopathy, lumbar region: Secondary | ICD-10-CM | POA: Diagnosis not present

## 2019-08-26 DIAGNOSIS — N200 Calculus of kidney: Secondary | ICD-10-CM | POA: Diagnosis not present

## 2019-08-26 DIAGNOSIS — M47812 Spondylosis without myelopathy or radiculopathy, cervical region: Secondary | ICD-10-CM | POA: Diagnosis not present

## 2019-08-26 DIAGNOSIS — D631 Anemia in chronic kidney disease: Secondary | ICD-10-CM | POA: Diagnosis not present

## 2019-08-26 DIAGNOSIS — N186 End stage renal disease: Secondary | ICD-10-CM | POA: Diagnosis not present

## 2019-08-26 DIAGNOSIS — E11319 Type 2 diabetes mellitus with unspecified diabetic retinopathy without macular edema: Secondary | ICD-10-CM | POA: Diagnosis not present

## 2019-08-26 DIAGNOSIS — E1122 Type 2 diabetes mellitus with diabetic chronic kidney disease: Secondary | ICD-10-CM | POA: Diagnosis not present

## 2019-08-28 ENCOUNTER — Encounter: Payer: Self-pay | Admitting: Internal Medicine

## 2019-08-28 DIAGNOSIS — M47812 Spondylosis without myelopathy or radiculopathy, cervical region: Secondary | ICD-10-CM | POA: Diagnosis not present

## 2019-08-28 DIAGNOSIS — D631 Anemia in chronic kidney disease: Secondary | ICD-10-CM | POA: Diagnosis not present

## 2019-08-28 DIAGNOSIS — M47816 Spondylosis without myelopathy or radiculopathy, lumbar region: Secondary | ICD-10-CM | POA: Diagnosis not present

## 2019-08-28 DIAGNOSIS — E11319 Type 2 diabetes mellitus with unspecified diabetic retinopathy without macular edema: Secondary | ICD-10-CM | POA: Diagnosis not present

## 2019-08-28 DIAGNOSIS — N186 End stage renal disease: Secondary | ICD-10-CM | POA: Diagnosis not present

## 2019-08-28 DIAGNOSIS — N200 Calculus of kidney: Secondary | ICD-10-CM | POA: Diagnosis not present

## 2019-08-28 DIAGNOSIS — Z466 Encounter for fitting and adjustment of urinary device: Secondary | ICD-10-CM | POA: Diagnosis not present

## 2019-08-28 DIAGNOSIS — E1122 Type 2 diabetes mellitus with diabetic chronic kidney disease: Secondary | ICD-10-CM | POA: Diagnosis not present

## 2019-08-28 DIAGNOSIS — I120 Hypertensive chronic kidney disease with stage 5 chronic kidney disease or end stage renal disease: Secondary | ICD-10-CM | POA: Diagnosis not present

## 2019-08-31 DIAGNOSIS — M47816 Spondylosis without myelopathy or radiculopathy, lumbar region: Secondary | ICD-10-CM | POA: Diagnosis not present

## 2019-08-31 DIAGNOSIS — M47812 Spondylosis without myelopathy or radiculopathy, cervical region: Secondary | ICD-10-CM | POA: Diagnosis not present

## 2019-08-31 DIAGNOSIS — N200 Calculus of kidney: Secondary | ICD-10-CM | POA: Diagnosis not present

## 2019-08-31 DIAGNOSIS — I120 Hypertensive chronic kidney disease with stage 5 chronic kidney disease or end stage renal disease: Secondary | ICD-10-CM | POA: Diagnosis not present

## 2019-08-31 DIAGNOSIS — E11319 Type 2 diabetes mellitus with unspecified diabetic retinopathy without macular edema: Secondary | ICD-10-CM | POA: Diagnosis not present

## 2019-08-31 DIAGNOSIS — E1122 Type 2 diabetes mellitus with diabetic chronic kidney disease: Secondary | ICD-10-CM | POA: Diagnosis not present

## 2019-08-31 DIAGNOSIS — Z466 Encounter for fitting and adjustment of urinary device: Secondary | ICD-10-CM | POA: Diagnosis not present

## 2019-08-31 DIAGNOSIS — D631 Anemia in chronic kidney disease: Secondary | ICD-10-CM | POA: Diagnosis not present

## 2019-08-31 DIAGNOSIS — N186 End stage renal disease: Secondary | ICD-10-CM | POA: Diagnosis not present

## 2019-09-03 ENCOUNTER — Other Ambulatory Visit: Payer: Self-pay | Admitting: Internal Medicine

## 2019-09-03 MED FILL — TAMSULOSIN HCL 0.4 MG CAP: 0.4 | 30 days supply | Qty: 30 | Fill #0

## 2019-09-04 DIAGNOSIS — N200 Calculus of kidney: Secondary | ICD-10-CM | POA: Diagnosis not present

## 2019-09-04 DIAGNOSIS — M47816 Spondylosis without myelopathy or radiculopathy, lumbar region: Secondary | ICD-10-CM | POA: Diagnosis not present

## 2019-09-04 DIAGNOSIS — E1122 Type 2 diabetes mellitus with diabetic chronic kidney disease: Secondary | ICD-10-CM | POA: Diagnosis not present

## 2019-09-04 DIAGNOSIS — Z466 Encounter for fitting and adjustment of urinary device: Secondary | ICD-10-CM | POA: Diagnosis not present

## 2019-09-04 DIAGNOSIS — M47812 Spondylosis without myelopathy or radiculopathy, cervical region: Secondary | ICD-10-CM | POA: Diagnosis not present

## 2019-09-04 DIAGNOSIS — E11319 Type 2 diabetes mellitus with unspecified diabetic retinopathy without macular edema: Secondary | ICD-10-CM | POA: Diagnosis not present

## 2019-09-04 DIAGNOSIS — I120 Hypertensive chronic kidney disease with stage 5 chronic kidney disease or end stage renal disease: Secondary | ICD-10-CM | POA: Diagnosis not present

## 2019-09-04 DIAGNOSIS — D631 Anemia in chronic kidney disease: Secondary | ICD-10-CM | POA: Diagnosis not present

## 2019-09-04 DIAGNOSIS — N186 End stage renal disease: Secondary | ICD-10-CM | POA: Diagnosis not present

## 2019-09-04 MED FILL — FUROSEMIDE 40 MG TAB: 40 | 30 days supply | Qty: 60 | Fill #0

## 2019-09-04 MED FILL — FINASTERIDE 5 MG TABLET: 5 | 30 days supply | Qty: 30 | Fill #1

## 2019-09-09 DIAGNOSIS — R29898 Other symptoms and signs involving the musculoskeletal system: Secondary | ICD-10-CM | POA: Insufficient documentation

## 2019-09-09 DIAGNOSIS — D631 Anemia in chronic kidney disease: Secondary | ICD-10-CM | POA: Diagnosis not present

## 2019-09-09 DIAGNOSIS — I120 Hypertensive chronic kidney disease with stage 5 chronic kidney disease or end stage renal disease: Secondary | ICD-10-CM | POA: Diagnosis not present

## 2019-09-09 DIAGNOSIS — E11319 Type 2 diabetes mellitus with unspecified diabetic retinopathy without macular edema: Secondary | ICD-10-CM | POA: Diagnosis not present

## 2019-09-09 DIAGNOSIS — M4802 Spinal stenosis, cervical region: Secondary | ICD-10-CM | POA: Diagnosis not present

## 2019-09-09 DIAGNOSIS — M47813 Spondylosis without myelopathy or radiculopathy, cervicothoracic region: Secondary | ICD-10-CM | POA: Diagnosis not present

## 2019-09-09 DIAGNOSIS — M47816 Spondylosis without myelopathy or radiculopathy, lumbar region: Secondary | ICD-10-CM | POA: Diagnosis not present

## 2019-09-09 DIAGNOSIS — M5124 Other intervertebral disc displacement, thoracic region: Secondary | ICD-10-CM | POA: Diagnosis not present

## 2019-09-09 DIAGNOSIS — Z466 Encounter for fitting and adjustment of urinary device: Secondary | ICD-10-CM | POA: Diagnosis not present

## 2019-09-09 DIAGNOSIS — N200 Calculus of kidney: Secondary | ICD-10-CM | POA: Diagnosis not present

## 2019-09-09 DIAGNOSIS — M48061 Spinal stenosis, lumbar region without neurogenic claudication: Secondary | ICD-10-CM | POA: Diagnosis not present

## 2019-09-09 DIAGNOSIS — N186 End stage renal disease: Secondary | ICD-10-CM | POA: Diagnosis not present

## 2019-09-09 DIAGNOSIS — E1122 Type 2 diabetes mellitus with diabetic chronic kidney disease: Secondary | ICD-10-CM | POA: Diagnosis not present

## 2019-09-09 DIAGNOSIS — M47812 Spondylosis without myelopathy or radiculopathy, cervical region: Secondary | ICD-10-CM | POA: Diagnosis not present

## 2019-09-11 DIAGNOSIS — Z466 Encounter for fitting and adjustment of urinary device: Secondary | ICD-10-CM | POA: Diagnosis not present

## 2019-09-11 DIAGNOSIS — N186 End stage renal disease: Secondary | ICD-10-CM | POA: Diagnosis not present

## 2019-09-11 DIAGNOSIS — M47812 Spondylosis without myelopathy or radiculopathy, cervical region: Secondary | ICD-10-CM | POA: Diagnosis not present

## 2019-09-11 DIAGNOSIS — I120 Hypertensive chronic kidney disease with stage 5 chronic kidney disease or end stage renal disease: Secondary | ICD-10-CM | POA: Diagnosis not present

## 2019-09-11 DIAGNOSIS — D631 Anemia in chronic kidney disease: Secondary | ICD-10-CM | POA: Diagnosis not present

## 2019-09-11 DIAGNOSIS — M47816 Spondylosis without myelopathy or radiculopathy, lumbar region: Secondary | ICD-10-CM | POA: Diagnosis not present

## 2019-09-11 DIAGNOSIS — E1122 Type 2 diabetes mellitus with diabetic chronic kidney disease: Secondary | ICD-10-CM | POA: Diagnosis not present

## 2019-09-11 DIAGNOSIS — N200 Calculus of kidney: Secondary | ICD-10-CM | POA: Diagnosis not present

## 2019-09-11 DIAGNOSIS — E11319 Type 2 diabetes mellitus with unspecified diabetic retinopathy without macular edema: Secondary | ICD-10-CM | POA: Diagnosis not present

## 2019-09-14 DIAGNOSIS — N200 Calculus of kidney: Secondary | ICD-10-CM | POA: Diagnosis not present

## 2019-09-14 DIAGNOSIS — Z466 Encounter for fitting and adjustment of urinary device: Secondary | ICD-10-CM | POA: Diagnosis not present

## 2019-09-14 DIAGNOSIS — E11319 Type 2 diabetes mellitus with unspecified diabetic retinopathy without macular edema: Secondary | ICD-10-CM | POA: Diagnosis not present

## 2019-09-14 DIAGNOSIS — R338 Other retention of urine: Secondary | ICD-10-CM | POA: Diagnosis not present

## 2019-09-14 DIAGNOSIS — M47812 Spondylosis without myelopathy or radiculopathy, cervical region: Secondary | ICD-10-CM | POA: Diagnosis not present

## 2019-09-14 DIAGNOSIS — M47816 Spondylosis without myelopathy or radiculopathy, lumbar region: Secondary | ICD-10-CM | POA: Diagnosis not present

## 2019-09-14 DIAGNOSIS — D631 Anemia in chronic kidney disease: Secondary | ICD-10-CM | POA: Diagnosis not present

## 2019-09-14 DIAGNOSIS — R3914 Feeling of incomplete bladder emptying: Secondary | ICD-10-CM | POA: Diagnosis not present

## 2019-09-14 DIAGNOSIS — N186 End stage renal disease: Secondary | ICD-10-CM | POA: Diagnosis not present

## 2019-09-14 DIAGNOSIS — I120 Hypertensive chronic kidney disease with stage 5 chronic kidney disease or end stage renal disease: Secondary | ICD-10-CM | POA: Diagnosis not present

## 2019-09-14 DIAGNOSIS — E1122 Type 2 diabetes mellitus with diabetic chronic kidney disease: Secondary | ICD-10-CM | POA: Diagnosis not present

## 2019-09-16 ENCOUNTER — Other Ambulatory Visit: Payer: Self-pay

## 2019-09-16 ENCOUNTER — Telehealth: Payer: Self-pay | Admitting: Internal Medicine

## 2019-09-16 ENCOUNTER — Ambulatory Visit: Payer: Medicare PPO | Admitting: Internal Medicine

## 2019-09-16 VITALS — BP 152/74 | HR 67 | Temp 97.9°F | Ht 72.0 in | Wt 168.6 lb

## 2019-09-16 DIAGNOSIS — Z79899 Other long term (current) drug therapy: Secondary | ICD-10-CM | POA: Diagnosis not present

## 2019-09-16 DIAGNOSIS — N184 Chronic kidney disease, stage 4 (severe): Secondary | ICD-10-CM | POA: Diagnosis not present

## 2019-09-16 DIAGNOSIS — R5383 Other fatigue: Secondary | ICD-10-CM | POA: Diagnosis not present

## 2019-09-16 DIAGNOSIS — I129 Hypertensive chronic kidney disease with stage 1 through stage 4 chronic kidney disease, or unspecified chronic kidney disease: Secondary | ICD-10-CM | POA: Diagnosis not present

## 2019-09-16 DIAGNOSIS — I120 Hypertensive chronic kidney disease with stage 5 chronic kidney disease or end stage renal disease: Secondary | ICD-10-CM | POA: Diagnosis not present

## 2019-09-16 DIAGNOSIS — N186 End stage renal disease: Secondary | ICD-10-CM | POA: Diagnosis not present

## 2019-09-16 DIAGNOSIS — M47816 Spondylosis without myelopathy or radiculopathy, lumbar region: Secondary | ICD-10-CM | POA: Diagnosis not present

## 2019-09-16 DIAGNOSIS — D631 Anemia in chronic kidney disease: Secondary | ICD-10-CM | POA: Diagnosis not present

## 2019-09-16 DIAGNOSIS — M47812 Spondylosis without myelopathy or radiculopathy, cervical region: Secondary | ICD-10-CM | POA: Diagnosis not present

## 2019-09-16 DIAGNOSIS — E1122 Type 2 diabetes mellitus with diabetic chronic kidney disease: Secondary | ICD-10-CM | POA: Diagnosis not present

## 2019-09-16 DIAGNOSIS — Z466 Encounter for fitting and adjustment of urinary device: Secondary | ICD-10-CM | POA: Diagnosis not present

## 2019-09-16 DIAGNOSIS — E11319 Type 2 diabetes mellitus with unspecified diabetic retinopathy without macular edema: Secondary | ICD-10-CM | POA: Diagnosis not present

## 2019-09-16 DIAGNOSIS — N200 Calculus of kidney: Secondary | ICD-10-CM | POA: Diagnosis not present

## 2019-09-16 LAB — CBC
HCT: 23.4 % — ABNORMAL LOW (ref 39.0–52.0)
Hemoglobin: 7.5 g/dL — ABNORMAL LOW (ref 13.0–17.0)
MCH: 30.5 pg (ref 26.0–34.0)
MCHC: 32.1 g/dL (ref 30.0–36.0)
MCV: 95.1 fL (ref 80.0–100.0)
Platelets: 321 K/uL (ref 150–400)
RBC: 2.46 MIL/uL — ABNORMAL LOW (ref 4.22–5.81)
RDW: 13.8 % (ref 11.5–15.5)
WBC: 6.5 K/uL (ref 4.0–10.5)
nRBC: 0 % (ref 0.0–0.2)

## 2019-09-16 LAB — BASIC METABOLIC PANEL WITH GFR
Anion gap: 12 (ref 5–15)
BUN: 35 mg/dL — ABNORMAL HIGH (ref 8–23)
CO2: 22 mmol/L (ref 22–32)
Calcium: 9.1 mg/dL (ref 8.9–10.3)
Chloride: 107 mmol/L (ref 98–111)
Creatinine, Ser: 3.66 mg/dL — ABNORMAL HIGH (ref 0.61–1.24)
GFR calc Af Amer: 19 mL/min — ABNORMAL LOW
GFR calc non Af Amer: 16 mL/min — ABNORMAL LOW
Glucose, Bld: 179 mg/dL — ABNORMAL HIGH (ref 70–99)
Potassium: 5.5 mmol/L — ABNORMAL HIGH (ref 3.5–5.1)
Sodium: 141 mmol/L (ref 135–145)

## 2019-09-16 LAB — TSH: TSH: 7.758 u[IU]/mL — ABNORMAL HIGH (ref 0.350–4.500)

## 2019-09-16 MED ORDER — LOKELMA 10 G PO PACK: 10.0000 g | PACK | Freq: Once | ORAL | 0 refills | Status: AC

## 2019-09-16 NOTE — Telephone Encounter (Signed)
HH PT jim called to state pt had just taken meds when he arrived. First BP was 140/80 he worked with pt at a moderate pace and mod exertion. Pt was slightly more winded than normal. BP 160/88 HR 80's 02 sat 93%. Pt states he feels tired a little more than usual. appt made for [address] today

## 2019-09-16 NOTE — Patient Instructions (Addendum)
Thank you for trusting me with your care. To recap, today we discussed the following:   Fatigue -I think it is likely your fatigue is due to having a low blood count.  It is not uncommon for someone to have side effects from the iron supplementation of fatigue and loss of appetite. However it is concerning you noticed these symptoms when starting the iron supplement. I recommend calling Dr. Bishop [place] office and discussing the side effect from the iron supplement. You can try  taking the iron pill with water and after you have eaten some food.   Your lab work showed: Your hemoglobin ( blood count) is practically unchanged from one month ago.  Your kidney function is worse, so I will call Dr.Upton to have you follow up with her. Your potassium is elevated and I sent in a prescription to the pharmacy to lower your potassium.   My best,  Tamsen Snider, MD

## 2019-09-16 NOTE — Telephone Encounter (Signed)
Thank you, sounds appropriate.

## 2019-09-16 NOTE — Progress Notes (Signed)
   CC: Extra tired after working with physical therapy at home  HPI:Dennis Macias is a 69 y.o. male who presents for evaluation of acute fatigue. Please see individual problem based A/P for details.  Past Medical History:  Diagnosis Date  . Anemia   . Arthritis    past hx   . Blindness    right eye  . Cataract    removed both eyes  . Dehydration   . Diabetes ([place])   . Glaucoma   . History of CVA (cerebrovascular accident) 09/13/2015  . History of urinary retention   . Hyperlipidemia   . Hypertension   . Pernicious anemia 02/24/2018  . Stroke [facility])    2017- March  . Tachycardia 08/26/2017  . Tubular adenoma of colon 02/2017  . Weight loss, non-intentional 08/26/2017   10 lbs between 6/18 & 2/19   Review of Systems:  ROS negative except as per HPI.  Physical Exam: Vitals:   09/16/19 1449  BP: (!) 152/74  Pulse: 67  Temp: 97.9 F (36.6 C)  TempSrc: Oral  SpO2: 98%  Weight: 168 lb 9.6 oz (76.5 kg)  Height: 6' (1.829 m)    General: Alert, nl appearance HE: Normocephalic, atraumatic , EOMI, Conjunctivae pale ENT: No congestion, no rhinorrhea moist, no exudate or erythema  Cardiovascular: Normal rate, regular rhythm.  No murmurs, rubs, or gallops Pulmonary : Effort normal, breath sounds normal. No wheezes, rales, or rhonchi Abdominal: soft, nontender,  bowel sounds present Musculoskeletal: no swelling , deformity, injury ,or tenderness in extremities, Skin: Warm, dry , no bruising, erythema, or rash Psychiatric/Behavioral:  normal mood, normal behavior   Assessment & Plan:   See Encounters Tab for problem based charting.  Patient discussed with Dr. Dareen Piano

## 2019-09-16 NOTE — Telephone Encounter (Signed)
HH PT JIM called

## 2019-09-17 ENCOUNTER — Encounter (HOSPITAL_COMMUNITY): Payer: Self-pay | Admitting: Emergency Medicine

## 2019-09-17 ENCOUNTER — Other Ambulatory Visit: Payer: Self-pay

## 2019-09-17 ENCOUNTER — Inpatient Hospital Stay (HOSPITAL_COMMUNITY): Payer: Medicare PPO

## 2019-09-17 ENCOUNTER — Inpatient Hospital Stay (HOSPITAL_COMMUNITY)
Admission: EM | Admit: 2019-09-17 | Discharge: 2019-09-21 | DRG: 683 | Disposition: A | Discharge status: Home Health | Payer: Medicare PPO | Attending: Internal Medicine | Admitting: Internal Medicine

## 2019-09-17 ENCOUNTER — Emergency Department (HOSPITAL_COMMUNITY): Payer: Medicare PPO

## 2019-09-17 DIAGNOSIS — I129 Hypertensive chronic kidney disease with stage 1 through stage 4 chronic kidney disease, or unspecified chronic kidney disease: Secondary | ICD-10-CM

## 2019-09-17 DIAGNOSIS — R0602 Shortness of breath: Secondary | ICD-10-CM

## 2019-09-17 DIAGNOSIS — N184 Chronic kidney disease, stage 4 (severe): Secondary | ICD-10-CM | POA: Diagnosis not present

## 2019-09-17 DIAGNOSIS — N401 Enlarged prostate with lower urinary tract symptoms: Secondary | ICD-10-CM | POA: Diagnosis not present

## 2019-09-17 DIAGNOSIS — R601 Generalized edema: Secondary | ICD-10-CM | POA: Diagnosis not present

## 2019-09-17 DIAGNOSIS — D631 Anemia in chronic kidney disease: Secondary | ICD-10-CM | POA: Diagnosis not present

## 2019-09-17 DIAGNOSIS — R10814 Left lower quadrant abdominal tenderness: Secondary | ICD-10-CM | POA: Diagnosis not present

## 2019-09-17 DIAGNOSIS — D649 Anemia, unspecified: Secondary | ICD-10-CM

## 2019-09-17 DIAGNOSIS — M1990 Unspecified osteoarthritis, unspecified site: Secondary | ICD-10-CM | POA: Diagnosis present

## 2019-09-17 DIAGNOSIS — R5381 Other malaise: Secondary | ICD-10-CM | POA: Diagnosis not present

## 2019-09-17 DIAGNOSIS — R630 Anorexia: Secondary | ICD-10-CM | POA: Diagnosis not present

## 2019-09-17 DIAGNOSIS — J9811 Atelectasis: Secondary | ICD-10-CM | POA: Diagnosis not present

## 2019-09-17 DIAGNOSIS — E8809 Other disorders of plasma-protein metabolism, not elsewhere classified: Secondary | ICD-10-CM | POA: Diagnosis present

## 2019-09-17 DIAGNOSIS — N138 Other obstructive and reflux uropathy: Secondary | ICD-10-CM | POA: Diagnosis not present

## 2019-09-17 DIAGNOSIS — R109 Unspecified abdominal pain: Secondary | ICD-10-CM

## 2019-09-17 DIAGNOSIS — R935 Abnormal findings on diagnostic imaging of other abdominal regions, including retroperitoneum: Secondary | ICD-10-CM | POA: Diagnosis not present

## 2019-09-17 DIAGNOSIS — Z20822 Contact with and (suspected) exposure to covid-19: Secondary | ICD-10-CM | POA: Diagnosis present

## 2019-09-17 DIAGNOSIS — R634 Abnormal weight loss: Secondary | ICD-10-CM | POA: Diagnosis not present

## 2019-09-17 DIAGNOSIS — R1030 Lower abdominal pain, unspecified: Secondary | ICD-10-CM | POA: Diagnosis not present

## 2019-09-17 DIAGNOSIS — H5440 Blindness, one eye, unspecified eye: Secondary | ICD-10-CM | POA: Diagnosis present

## 2019-09-17 DIAGNOSIS — Z9889 Other specified postprocedural states: Secondary | ICD-10-CM

## 2019-09-17 DIAGNOSIS — J90 Pleural effusion, not elsewhere classified: Secondary | ICD-10-CM | POA: Diagnosis not present

## 2019-09-17 DIAGNOSIS — D509 Iron deficiency anemia, unspecified: Secondary | ICD-10-CM | POA: Diagnosis present

## 2019-09-17 DIAGNOSIS — R338 Other retention of urine: Secondary | ICD-10-CM | POA: Diagnosis present

## 2019-09-17 DIAGNOSIS — Z8249 Family history of ischemic heart disease and other diseases of the circulatory system: Secondary | ICD-10-CM

## 2019-09-17 DIAGNOSIS — K529 Noninfective gastroenteritis and colitis, unspecified: Secondary | ICD-10-CM

## 2019-09-17 DIAGNOSIS — E1122 Type 2 diabetes mellitus with diabetic chronic kidney disease: Secondary | ICD-10-CM | POA: Diagnosis not present

## 2019-09-17 DIAGNOSIS — R946 Abnormal results of thyroid function studies: Secondary | ICD-10-CM | POA: Diagnosis not present

## 2019-09-17 DIAGNOSIS — Z79899 Other long term (current) drug therapy: Secondary | ICD-10-CM | POA: Diagnosis not present

## 2019-09-17 DIAGNOSIS — R05 Cough: Secondary | ICD-10-CM

## 2019-09-17 DIAGNOSIS — K5900 Constipation, unspecified: Secondary | ICD-10-CM | POA: Diagnosis present

## 2019-09-17 DIAGNOSIS — Z8349 Family history of other endocrine, nutritional and metabolic diseases: Secondary | ICD-10-CM

## 2019-09-17 DIAGNOSIS — R61 Generalized hyperhidrosis: Secondary | ICD-10-CM

## 2019-09-17 DIAGNOSIS — R5383 Other fatigue: Secondary | ICD-10-CM | POA: Diagnosis not present

## 2019-09-17 DIAGNOSIS — Z8673 Personal history of transient ischemic attack (TIA), and cerebral infarction without residual deficits: Secondary | ICD-10-CM | POA: Diagnosis not present

## 2019-09-17 DIAGNOSIS — Z960 Presence of urogenital implants: Secondary | ICD-10-CM | POA: Diagnosis not present

## 2019-09-17 DIAGNOSIS — R52 Pain, unspecified: Secondary | ICD-10-CM | POA: Diagnosis not present

## 2019-09-17 DIAGNOSIS — Z6823 Body mass index (BMI) 23.0-23.9, adult: Secondary | ICD-10-CM

## 2019-09-17 DIAGNOSIS — R319 Hematuria, unspecified: Secondary | ICD-10-CM

## 2019-09-17 DIAGNOSIS — E785 Hyperlipidemia, unspecified: Secondary | ICD-10-CM | POA: Diagnosis present

## 2019-09-17 DIAGNOSIS — E875 Hyperkalemia: Secondary | ICD-10-CM | POA: Diagnosis not present

## 2019-09-17 DIAGNOSIS — Z87891 Personal history of nicotine dependence: Secondary | ICD-10-CM

## 2019-09-17 DIAGNOSIS — N179 Acute kidney failure, unspecified: Principal | ICD-10-CM | POA: Diagnosis present

## 2019-09-17 DIAGNOSIS — E119 Type 2 diabetes mellitus without complications: Secondary | ICD-10-CM | POA: Diagnosis not present

## 2019-09-17 DIAGNOSIS — H409 Unspecified glaucoma: Secondary | ICD-10-CM | POA: Diagnosis present

## 2019-09-17 DIAGNOSIS — N049 Nephrotic syndrome with unspecified morphologic changes: Secondary | ICD-10-CM

## 2019-09-17 DIAGNOSIS — R339 Retention of urine, unspecified: Secondary | ICD-10-CM | POA: Diagnosis not present

## 2019-09-17 DIAGNOSIS — Z7982 Long term (current) use of aspirin: Secondary | ICD-10-CM

## 2019-09-17 DIAGNOSIS — N320 Bladder-neck obstruction: Secondary | ICD-10-CM | POA: Diagnosis present

## 2019-09-17 LAB — PROTEIN / CREATININE RATIO, URINE
Creatinine, Urine: 82.45 mg/dL
Protein Creatinine Ratio: 3.83 mg/mg{Cre} — ABNORMAL HIGH (ref 0.00–0.15)
Total Protein, Urine: 316 mg/dL

## 2019-09-17 LAB — COMPREHENSIVE METABOLIC PANEL
ALT: 12 U/L (ref 0–44)
AST: 17 U/L (ref 15–41)
Albumin: 2.8 g/dL — ABNORMAL LOW (ref 3.5–5.0)
Alkaline Phosphatase: 62 U/L (ref 38–126)
Anion gap: 14 (ref 5–15)
BUN: 34 mg/dL — ABNORMAL HIGH (ref 8–23)
CO2: 20 mmol/L — ABNORMAL LOW (ref 22–32)
Calcium: 9 mg/dL (ref 8.9–10.3)
Chloride: 106 mmol/L (ref 98–111)
Creatinine, Ser: 3.58 mg/dL — ABNORMAL HIGH (ref 0.61–1.24)
GFR calc Af Amer: 19 mL/min — ABNORMAL LOW (ref >60)
GFR calc non Af Amer: 16 mL/min — ABNORMAL LOW (ref >60)
Glucose, Bld: 165 mg/dL — ABNORMAL HIGH (ref 70–99)
Potassium: 5 mmol/L (ref 3.5–5.1)
Sodium: 140 mmol/L (ref 135–145)
Total Bilirubin: 0.7 mg/dL (ref 0.3–1.2)
Total Protein: 6.2 g/dL — ABNORMAL LOW (ref 6.5–8.1)

## 2019-09-17 LAB — CBC
HCT: 22.6 % — ABNORMAL LOW (ref 39.0–52.0)
Hemoglobin: 7.2 g/dL — ABNORMAL LOW (ref 13.0–17.0)
MCH: 30.3 pg (ref 26.0–34.0)
MCHC: 31.9 g/dL (ref 30.0–36.0)
MCV: 95 fL (ref 80.0–100.0)
Platelets: 320 10*3/uL (ref 150–400)
RBC: 2.38 MIL/uL — ABNORMAL LOW (ref 4.22–5.81)
RDW: 13.8 % (ref 11.5–15.5)
WBC: 6.3 10*3/uL (ref 4.0–10.5)
nRBC: 0 % (ref 0.0–0.2)

## 2019-09-17 LAB — URINALYSIS, ROUTINE W REFLEX MICROSCOPIC
Bilirubin Urine: NEGATIVE
Glucose, UA: 50 mg/dL — AB
Hgb urine dipstick: NEGATIVE
Ketones, ur: NEGATIVE mg/dL
Leukocytes,Ua: NEGATIVE
Nitrite: NEGATIVE
Protein, ur: >=300 mg/dL — AB
Specific Gravity, Urine: 1.011 (ref 1.005–1.030)
pH: 7 (ref 5.0–8.0)

## 2019-09-17 LAB — CBG MONITORING, ED: Glucose-Capillary: 106 mg/dL — ABNORMAL HIGH (ref 70–99)

## 2019-09-17 LAB — SARS CORONAVIRUS 2 (TAT 6-24 HRS): SARS Coronavirus 2: NEGATIVE

## 2019-09-17 LAB — LIPASE, BLOOD: Lipase: 18 U/L (ref 11–51)

## 2019-09-17 LAB — GLUCOSE, CAPILLARY: Glucose-Capillary: 123 mg/dL — ABNORMAL HIGH (ref 70–99)

## 2019-09-17 MED ORDER — METOPROLOL TARTRATE 12.5 MG HALF TABLET
12.5000 mg | ORAL_TABLET | Freq: Two times a day (BID) | ORAL | Status: DC
  Administered 2019-09-17 – 2019-09-21 (×10): 12.5 mg via ORAL
  Filled 2019-09-17 (×10): qty 1

## 2019-09-17 MED ORDER — FINASTERIDE 5 MG PO TABS
5.0000 mg | ORAL_TABLET | Freq: Every day | ORAL | Status: DC
  Administered 2019-09-17 – 2019-09-21 (×5): 5 mg via ORAL
  Filled 2019-09-17 (×5): qty 1

## 2019-09-17 MED ORDER — SODIUM CHLORIDE 0.9% FLUSH: 3.0000 mL | Freq: Once | INTRAVENOUS | Status: DC

## 2019-09-17 MED ORDER — HEPARIN SODIUM (PORCINE) 5000 UNIT/ML IJ SOLN
5000.0000 [IU] | Freq: Three times a day (TID) | INTRAMUSCULAR | Status: DC
  Administered 2019-09-17 – 2019-09-21 (×12): 5000 [IU] via SUBCUTANEOUS
  Filled 2019-09-17 (×12): qty 1

## 2019-09-17 MED ORDER — FUROSEMIDE 10 MG/ML IJ SOLN
60.0000 mg | Freq: Once | INTRAMUSCULAR | Status: AC
  Administered 2019-09-17: 60 mg via INTRAVENOUS
  Filled 2019-09-17: qty 6

## 2019-09-17 MED ORDER — INSULIN ASPART 100 UNIT/ML ~~LOC~~ SOLN
0.0000 [IU] | Freq: Three times a day (TID) | SUBCUTANEOUS | Status: DC
  Administered 2019-09-20: 1 [IU] via SUBCUTANEOUS
  Administered 2019-09-21: 0 [IU] via SUBCUTANEOUS

## 2019-09-17 MED ORDER — CHLORHEXIDINE GLUCONATE CLOTH 2 % EX PADS
6.0000 | MEDICATED_PAD | Freq: Every day | CUTANEOUS | Status: DC
  Administered 2019-09-17 – 2019-09-21 (×5): 6 via TOPICAL

## 2019-09-17 MED ORDER — IOHEXOL 9 MG/ML PO SOLN
500.0000 mL | ORAL | Status: AC
  Administered 2019-09-17 (×2): 500 mL via ORAL

## 2019-09-17 MED ORDER — INSULIN ASPART 100 UNIT/ML ~~LOC~~ SOLN: 0.0000 [IU] | Freq: Every day | SUBCUTANEOUS | Status: DC

## 2019-09-17 MED ORDER — SODIUM ZIRCONIUM CYCLOSILICATE 10 G PO PACK
10.0000 g | PACK | Freq: Once | ORAL | Status: AC
  Administered 2019-09-17: 10 g via ORAL
  Filled 2019-09-17: qty 1

## 2019-09-17 MED ORDER — IOHEXOL 9 MG/ML PO SOLN
ORAL | Status: AC
  Filled 2019-09-17: qty 1000

## 2019-09-17 MED ORDER — AMLODIPINE BESYLATE 10 MG PO TABS
10.0000 mg | ORAL_TABLET | Freq: Every day | ORAL | Status: DC
  Administered 2019-09-17 – 2019-09-21 (×5): 10 mg via ORAL
  Filled 2019-09-17: qty 1
  Filled 2019-09-17: qty 2
  Filled 2019-09-17 (×3): qty 1

## 2019-09-17 MED ORDER — DORZOLAMIDE HCL-TIMOLOL MAL 2-0.5 % OP SOLN
1.0000 [drp] | Freq: Two times a day (BID) | OPHTHALMIC | Status: DC
  Administered 2019-09-17 – 2019-09-21 (×9): 1 [drp] via OPHTHALMIC
  Filled 2019-09-17: qty 10

## 2019-09-17 MED ORDER — TAMSULOSIN HCL 0.4 MG PO CAPS
0.4000 mg | ORAL_CAPSULE | Freq: Every day | ORAL | Status: DC
  Administered 2019-09-17 – 2019-09-21 (×5): 0.4 mg via ORAL
  Filled 2019-09-17 (×5): qty 1

## 2019-09-17 NOTE — ED Provider Notes (Signed)
[place] EMERGENCY DEPARTMENT Provider Note   CSN: [id number] Arrival date & time: 09/17/19  0549     History Chief Complaint  Patient presents with  . Abdominal Pain    Dennis Macias is a 69 y.o. male.  HPI Patient presents with abdominal pain.  States began last night but has had episodes over the last week also.  Saw PCP yesterday and I did not see mention of this although he does have a renal mass.  Also states when he eats he gets sweatiness.  Reviewing records this appears to be chronic for him.  No dysuria.  States has been urinating fine.  Has history of bladder outlet obstruction and used to have to catheterize.  Denies nausea vomiting constipation.    Past Medical History:  Diagnosis Date  . Anemia   . Arthritis    past hx   . Blindness    right eye  . Cataract    removed both eyes  . Dehydration   . Diabetes ([place])   . Glaucoma   . History of CVA (cerebrovascular accident) 09/13/2015  . History of urinary retention   . Hyperlipidemia   . Hypertension   . Pernicious anemia 02/24/2018  . Stroke [facility])    2017- March  . Tachycardia 08/26/2017  . Tubular adenoma of colon 02/2017  . Weight loss, non-intentional 08/26/2017   10 lbs between 6/18 & 2/19    Patient Active Problem List   Diagnosis Date Noted  . Fatigue 09/17/2019  . Hematuria, unspecified 08/13/2019  . Anasarca associated with disorder of kidney 08/05/2019  . Protein calorie malnutrition ([place]) 07/31/2019  . Left renal mass 07/21/2019  . CKD (chronic kidney disease) stage 4, GFR 15-29 ml/min (HCC) 04/23/2019  . Hip weakness 08/13/2018  . Pernicious anemia 02/24/2018  . Weight loss, non-intentional 08/26/2017  . 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 BPH with urinary obstruction   . Normocytic anemia 12/21/2016  . 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 Hyperlipidemia 09/13/2015  . 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 Essential  hypertension     Past Surgical History:  Procedure Laterality Date  . CATARACT EXTRACTION, BILATERAL    . COLONOSCOPY    . POLYPECTOMY    . REFRACTIVE SURGERY  10/2017  . TEE WITHOUT CARDIOVERSION N/A 09/14/2015   Procedure: TRANSESOPHAGEAL ECHOCARDIOGRAM (TEE);  Surgeon: Larey Dresser, MD;  Location: [place];  Service: Cardiovascular;  Laterality: N/A;  . UPPER GASTROINTESTINAL ENDOSCOPY         Family History  Problem Relation Age of Onset  . Hypertension Mother   . Hyperlipidemia Mother   . Hyperlipidemia Father   . Colon cancer Neg Hx   . Colon polyps Neg Hx   . Esophageal cancer Neg Hx   . Rectal cancer Neg Hx   . Stomach cancer Neg Hx     Social History   Tobacco Use  . Smoking status: Former Research scientist (life sciences)  . Smokeless tobacco: Former Systems developer    Types: Chew    Quit date: 07/09/1978  . Tobacco comment: quit 1 year ago  Substance Use Topics  . Alcohol use: No    Alcohol/week: 1.0 standard drinks    Types: 1 Cans of beer per week    Comment: quit last march/2017  . Drug use: No    Home Medications Prior to Admission medications   Medication Sig Start Date End Date Taking? Authorizing Provider  amLODipine (NORVASC) 10 MG  tablet Take 1 tablet (10 mg total) by mouth daily. 07/22/19  Yes [name], Rylee, MD  aspirin 81 MG tablet Take 1 tablet (81 mg total) by mouth daily. Internal Medicine Program place on hold until patient requests to fill 01/27/16  Yes Jule Ser, DO  atorvastatin (LIPITOR) 80 MG tablet Take 1 tablet (80 mg total) by mouth at bedtime. IM program 07/22/19  Yes [name], Rylee, MD  dorzolamide-timolol (COSOPT) 22.3-6.8 MG/ML ophthalmic solution Place 1 drop into the right eye 2 (two) times daily.  01/16/18  Yes [provider]  ferrous gluconate (FERGON) 324 MG tablet Take 324 mg by mouth daily with breakfast.   Yes [provider]  finasteride (PROSCAR) 5 MG tablet Take 5 mg by mouth daily. 08/12/19  Yes [provider]    furosemide (LASIX) 40 MG tablet TAKE 1 TABLET (40 MG TOTAL) BY MOUTH 2 (TWO) TIMES DAILY. 09/04/19 09/03/20 Yes [name], Rylee, MD  LOKELMA 10 g PACK packet Take 1 packet by mouth once.  09/16/19  Yes [provider]  metoprolol tartrate (LOPRESSOR) 25 MG tablet Take 0.5 tablets (12.5 mg total) by mouth 2 (two) times daily. 07/15/19 09/17/19 Yes [name], Rylee, MD  sodium bicarbonate 650 MG tablet Take 650 mg by mouth 2 (two) times daily.   Yes [provider]  tamsulosin (FLOMAX) 0.4 MG CAPS capsule TAKE 1 CAPSULE (0.4 MG TOTAL) BY MOUTH DAILY AFTER SUPPER. 08/24/19  Yes [name], Rylee, MD  vitamin B-12 (CYANOCOBALAMIN) 1000 MCG tablet Take 1,000 mcg by mouth daily.   Yes [provider]  ACCU-CHEK FASTCLIX LANCETS MISC Check blood sugar up to 7 times a week as instructed 08/13/18   Isabelle Course, MD  Blood Glucose Monitoring Suppl (ACCU-CHEK GUIDE) w/Device KIT 1 each by Does not apply route daily. Check blood sugar as instructed up to 7 times a week 08/13/18   Isabelle Course, MD  glucose blood (ACCU-CHEK GUIDE) test strip Check blood sugar up to 7 times a week as instructed 08/13/18   Isabelle Course, MD    Allergies    Patient has no known allergies.  Review of Systems   Review of Systems  Constitutional: Positive for appetite change and fatigue.  Respiratory: Negative for shortness of breath.   Gastrointestinal: Positive for abdominal pain.  Genitourinary: Negative for dysuria.  Musculoskeletal: Negative for gait problem.  Neurological: Negative for weakness.    Physical Exam Updated Vital Signs BP (!) 170/83   Pulse 85   Temp 97.7 F (36.5 C) (Oral)   Resp (!) 26   Ht 6' (1.829 m)   Wt 80 kg   SpO2 92%   BMI 23.92 kg/m   Physical Exam Vitals reviewed.  HENT:     Head: Normocephalic.  Cardiovascular:     Rate and Rhythm: Regular rhythm.  Pulmonary:     Breath sounds: Normal breath sounds.  Abdominal:     Hernia: No hernia is present.      Comments: Very mild tenderness.  No clear mass.  No hernia palpated.  Skin:    General: Skin is warm.     Capillary Refill: Capillary refill takes less than 2 seconds.  Neurological:     Mental Status: He is alert.     Comments: Patient is awake and pleasant.     ED Results / Procedures / Treatments   Labs (all labs ordered are listed, but only abnormal results are displayed) Labs Reviewed  COMPREHENSIVE METABOLIC PANEL - Abnormal; Notable for  the following components:      Result Value   CO2 20 (*)    Glucose, Bld 165 (*)    BUN 34 (*)    Creatinine, Ser 3.58 (*)    Total Protein 6.2 (*)    Albumin 2.8 (*)    GFR calc non Af Amer 16 (*)    GFR calc Af Amer 19 (*)    All other components within normal limits  CBC - Abnormal; Notable for the following components:   RBC 2.38 (*)    Hemoglobin 7.2 (*)    HCT 22.6 (*)    All other components within normal limits  SARS CORONAVIRUS 2 (TAT 6-24 HRS)  LIPASE, BLOOD  URINALYSIS, ROUTINE W REFLEX MICROSCOPIC    EKG None  Radiology CT ABDOMEN PELVIS WO CONTRAST  Result Date: 09/17/2019 CLINICAL DATA:  Lower abdominal pain. EXAM: CT ABDOMEN AND PELVIS WITHOUT CONTRAST TECHNIQUE: Multidetector CT imaging of the abdomen and pelvis was performed following the standard protocol without IV contrast. COMPARISON:  CT scan 07/31/2019 FINDINGS: Lower chest: Moderate-sized bilateral pleural effusions with moderate overlying atelectasis. The heart is normal in size. No pericardial effusion. Hepatobiliary: No hepatic lesions are identified without contrast. No intra or extrahepatic biliary dilatation. The gallbladder is grossly normal. Pancreas: No mass, inflammation or ductal dilatation. Spleen: Normal size. No focal lesions. Adrenals/Urinary Tract: The adrenal glands and kidneys are unremarkable. No renal, ureteral or bladder calculi or mass. There is air noted in the bladder. This may be due to recent instrumentation or catheterization. I do not  see any findings suspicious for a colovesical fistula. Recommend clinical correlation. Stomach/Bowel: The stomach, duodenum and most of the small bowel appear normal. The distal and terminal ileum demonstrate wall thickening. The adjacent ascending colon also demonstrates wall thickening. No findings for obstruction. The appendix is normal. The remainder of the colon is unremarkable. No findings for diverticulosis or acute diverticulitis. Vascular/Lymphatic: Extensive vascular calcifications likely related to diabetes. No aneurysm. No abdominal or pelvic adenopathy. Reproductive: The prostate gland is mildly enlarged. The seminal vesicles appear normal. Other: Mild mesenteric edema, small amount of free pelvic fluid and diffuse body wall edema suggesting anasarca. Musculoskeletal: No acute bony findings. The pubic symphysis appears fused. Possible prior trauma. IMPRESSION: 1. Moderate-sized bilateral pleural effusions with overlying atelectasis. 2. Wall thickening involving the distal and terminal ileum and adjacent ascending colon suggesting inflammatory or infectious process. I do not see any findings for obstruction or perforation. 3. Air in the bladder could be due to recent instrumentation or catheterization. I do not see any findings suspicious for a colovesical fistula. 4. Diffuse body wall edema and small amount of free pelvic fluid suggesting anasarca. 5. Extensive vascular calcifications likely related to diabetes. Electronically Signed   By: Marijo Sanes M.D.   On: 09/17/2019 10:56    Procedures Procedures (including critical care time)  Medications Ordered in ED Medications  sodium chloride flush (NS) 0.9 % injection 3 mL (3 mLs Intravenous Not Given 09/17/19 1129)  iohexol (OMNIPAQUE) 9 MG/ML oral solution 500 mL (500 mLs Oral Contrast Given 09/17/19 3734)    ED Course  I have reviewed the triage vital signs and the nursing notes.  Pertinent labs & imaging results that were available  during my care of the patient were reviewed by me and considered in my medical decision making (see chart for details).    MDM Rules/Calculators/A&P  Patient presents with abdominal pain.  Has had for the last week.  Lab work shows acute kidney injury with creatinine has gone up by almost 1.  Lab work yesterday also showed this.  Also worsening anemia but this appears to be chronic.  However patient states he is really not been able to eat and drink.  No appetite.  CT scan shows enterocolitis in the right lower quadrant.  With acute kidney injury worsening anemia and abdominal pain I feels the patient would benefit from [place] to the hospital.  Will discuss with internal medicine residents Final Clinical Impression(s) / ED Diagnoses Final diagnoses:  Abdominal pain, unspecified abdominal location  Enterocolitis  AKI (acute kidney injury) [facility])    Rx / DC Orders ED Discharge Orders    None       Davonna Belling, MD 09/17/19 1133

## 2019-09-17 NOTE — Discharge Planning (Signed)
Pt currently active with [facility] for Home Health services.

## 2019-09-17 NOTE — ED Notes (Signed)
Patient transported to CT 

## 2019-09-17 NOTE — Progress Notes (Signed)
Patient arrived to room in NAD, VS stable.

## 2019-09-17 NOTE — ED Notes (Signed)
Post residual volume was 315ml. Pt felt no pain or pressure post void.

## 2019-09-17 NOTE — H&P (Signed)
Date: 09/17/2019               Patient Name:  Dennis Macias MRN: [id number]  DOB: 06/20/1951 Age / Sex: 69 y.o., male   PCP: [name], MD         Medical Service: Internal Medicine Teaching Service         Attending Physician: Dr. Heber Glen Lyn, Rachel Moulds, DO    First Contact: Darrick Meigs, MD, Rylee Pager: RC [phone])  Second Contact: Sharon Seller DO, Jaimie PagerJari Pigg [phone])       After Hours (After 5p/  First Contact Pager: [phone]  weekends / holidays): Second Contact Pager: [phone]   Chief Complaint: increased sweating  History of Present Illness: 69 y.o. yo male w/ PMH significant for CKD IV w/nephrotic syndrome, HTN, T2DM, BPH with obstruction, gustatory hyperhidrosis, unintentional weight loss, hematuria, normocytic normochromic anemia.  Presents with increased sweating and a feeling in the bottom of his stomach but not pain which began around 2 days ago.  He has had the sweating going on mainly when he eats for around 3-4 years he carries diagnosis of  gustatory hyperhydrosis but now he sweats randomly and it has increased in frequency and intensity so he decided to come in.  He feels he has also had longstanding fatigue and weakness for around 6 months.  He reports eating breakfast regularly, drinks orange juice or water.  Denies Nausea, vomiting or diarrhea, shortness of breath, problems with urination.  He does report coughing for around 2 days with white sputum.  He had a foley catheter removed around one week ago.  He feels overall his anasarca has improved because he no longer wears compression hose.  [facility] follows with [facility]. He does not think he has had a cystoscopy he does report getting foley catheter removed around 1 week ago and participating in a voiding trial.  He also reports he was supposed to have an appointment with Dr. Hollie Salk at [facility] today but missed that to come here.    Meds:  Current Meds  Medication Sig  .  amLODipine (NORVASC) 10 MG tablet Take 1 tablet (10 mg total) by mouth daily.  Marland Kitchen aspirin 81 MG tablet Take 1 tablet (81 mg total) by mouth daily. Internal Medicine Program place on hold until patient requests to fill  . atorvastatin (LIPITOR) 80 MG tablet Take 1 tablet (80 mg total) by mouth at bedtime. IM program  . dorzolamide-timolol (COSOPT) 22.3-6.8 MG/ML ophthalmic solution Place 1 drop into the right eye 2 (two) times daily.   . ferrous gluconate (FERGON) 324 MG tablet Take 324 mg by mouth daily with breakfast.  . finasteride (PROSCAR) 5 MG tablet Take 5 mg by mouth daily.  . furosemide (LASIX) 40 MG tablet TAKE 1 TABLET (40 MG TOTAL) BY MOUTH 2 (TWO) TIMES DAILY.  Marland Kitchen LOKELMA 10 g PACK packet Take 1 packet by mouth once.   . metoprolol tartrate (LOPRESSOR) 25 MG tablet Take 0.5 tablets (12.5 mg total) by mouth 2 (two) times daily.  . sodium bicarbonate 650 MG tablet Take 650 mg by mouth 2 (two) times daily.  . tamsulosin (FLOMAX) 0.4 MG CAPS capsule TAKE 1 CAPSULE (0.4 MG TOTAL) BY MOUTH DAILY AFTER SUPPER.  . vitamin B-12 (CYANOCOBALAMIN) 1000 MCG tablet Take 1,000 mcg by mouth daily.     Allergies: Allergies as of 09/17/2019  . (No Known Allergies)   Past Medical History:  Diagnosis Date  . Anemia   .  Arthritis    past hx   . Blindness    right eye  . Cataract    removed both eyes  . Dehydration   . Diabetes ([place])   . Glaucoma   . History of CVA (cerebrovascular accident) 09/13/2015  . History of urinary retention   . Hyperlipidemia   . Hypertension   . Pernicious anemia 02/24/2018  . Stroke [facility])    2017- March  . Tachycardia 08/26/2017  . Tubular adenoma of colon 02/2017  . Weight loss, non-intentional 08/26/2017   10 lbs between 6/18 & 2/19    Family History:  Family History  Problem Relation Age of Onset  . Hypertension Mother   . Hyperlipidemia Mother   . Hyperlipidemia Father   . Colon cancer Neg Hx   . Colon polyps Neg Hx   . Esophageal cancer Neg Hx   .  Rectal cancer Neg Hx   . Stomach cancer Neg Hx      Social History:  Social History   Tobacco Use  . Smoking status: Former Research scientist (life sciences)  . Smokeless tobacco: Former Systems developer    Types: Chew    Quit date: 07/09/1978  . Tobacco comment: quit 1 year ago  Substance Use Topics  . Alcohol use: No    Alcohol/week: 1.0 standard drinks    Types: 1 Cans of beer per week    Comment: quit last march/2017  . Drug use: No     Review of Systems: A complete ROS was negative except as per HPI.   Physical Exam: Blood pressure (!) 147/82, pulse 85, temperature 97.7 F (36.5 C), temperature source Oral, resp. rate (!) 21, height 6' (1.829 m), weight 80 kg, SpO2 92 %. Physical Exam Constitutional:      Appearance: He is not diaphoretic.  HENT:     Head: Normocephalic and atraumatic.  Eyes:     General:        Right eye: No discharge.        Left eye: No discharge.  Cardiovascular:     Rate and Rhythm: Normal rate and regular rhythm.     Heart sounds: Normal heart sounds. No murmur. No friction rub. No gallop.   Pulmonary:     Effort: Accessory muscle usage present. No respiratory distress.     Breath sounds: Examination of the right-middle field reveals wheezing. Examination of the left-middle field reveals wheezing. Examination of the right-lower field reveals decreased breath sounds. Examination of the left-lower field reveals decreased breath sounds. Decreased breath sounds and wheezing present. No rales.  Abdominal:     General: There is no distension.     Palpations: Abdomen is soft. There is no mass.     Tenderness: There is no abdominal tenderness. There is no guarding.  Musculoskeletal:     Right lower leg: 2+ Edema present.     Left lower leg: 2+ Edema present.  Neurological:     Mental Status: He is alert and oriented to person, place, and time.     EKG: personally reviewed my interpretation is none available  CXR: personally reviewed my interpretation is none available  Assessment  & Plan by Problem: Active Problems:   Anasarca  Hyperhidrosis episodes: seems to have progressed from just happening with meals to happening randomly and more intensely, which is one of the main reasons he came in.    -check urine 5HIAA for possible carcinoid syndrome -Chest CT when volume status improves  Anasarca w/ Bilateral Pleural Effusions: moderate bilateral pleural  effusions with associated atelectasis.  Leading diagnosis appears to be from nephrotic syndrome  -fu with chest x-ray for now -will likely improve with diuresis, discussed with nephrology they will evaluate and see if they are in agreement -ultimately may need thoracentesis with fluid analysis and cytology  AKI on CKD 4 w/Nephrotic syndrome: follows with [facility], outpatient regimen lasix 83m BID  -not completely sure of cause did have history of uncontrolled T2DM and HTN -nephrology following appreciate recommendations  Fatigue/Unintentional weight loss: Pt with possible malignancy, there has been a question of urologic malingnancy given past imaging findings with renal mass and past hematuria.    BPH w/urinary retention: PVR unacceptably high, bladder quite distended on CT scan and with AKI will insert foley catheter  ?Enterocolitis: clinically has no symptoms consistent with imaging findings, likely the strange feeling in his lower abdomen he describes is due to urinary retention. Possibly another reason for bowel wall thickening like edema vs other process   -continue supportive care  Normocytic Anemia: extensive workup including bone marrow biopsy felt to be anemia of CKD/chronic disease  -monitor for now  -repeating MM panel today  Cough: likely 2/2 fluid around lungs  -rule out covid -chest x-ray  T2DM: hx of poor control, seems to have improved with weight loss and worsening renal function, now on no outpatient medications  -SSI sensitive here   Dispo: Admit patient to Inpatient with expected length  of stay greater than 2 midnights.  Signed: WKatherine Roan MD 09/17/2019, 1:20 PM

## 2019-09-17 NOTE — ED Triage Notes (Signed)
Patient arrived with EMS from home reports low abdominal pain this week , no emesis or diarrhea , denies fever or chills . CBG= 184.

## 2019-09-17 NOTE — Consult Note (Signed)
[facility] Renal Consultation Note  Requesting MD: Joni Reining, DO Indication for Consultation:  AKI on CKD   Chief complaint: Abdominal pain  HPI:  Dennis Macias is a 69 y.o. male with a history of CKD, hypertension, diabetes, and anemia who presented to the hospital with abdominal discomfort.  He describes a vague discomfort but denies overt pain and has also had sweating.  He used to just sweat when eating but states now is more often and happens even when not eating.  No associated nausea or vomiting.  He states that he is having some difficulty breathing off and on.  His swelling is present but states much better than prior - before had weeping and this isn't happening anymore.  He is noted to have worsening CKD.  The patient follows with our office at [place] with Dr. Hollie Salk.  Creatinine trends as below.  Potassium was 5.5 on 3/10 and he was ordered [facility].  Improved today to 5.0.  The patient does have a history of prior bladder outlet obstruction and previously had to use a catheter.  Note that post void residual bladder scan was 380 mL today.  Foley has been ordered per team.  He has been on Lasix twice daily.  He states that his girlfriend gives him his pills.  He denies any NSAID use.  He was found on CT to have diffuse body wall edema and moderate bilateral pleural effusions.  Adrenals and kidneys are reported as unremarkable; air in the bladder may be due to recent instrumentation or catheterization.  There was possible infectious or inflammatory process as evidenced by wall thickening in the distal and terminal ileum and ascending colon. No renal mass was noted on today's CT.  Outpatient screening for carcinoid with 24 hour urine neg per primary nephrologist.  Team is working up for possible malignancy  Creatinine, Ser  Date/Time Value Ref Range Status  09/17/2019 06:04 AM 3.58 (H) 0.61 - 1.24 mg/dL Final  09/16/2019 03:55 PM 3.66 (H) 0.61 - 1.24 mg/dL Final  08/13/2019 10:28  PM 2.63 (H) 0.61 - 1.24 mg/dL Final  08/13/2019 11:39 AM 2.53 (H) 0.76 - 1.27 mg/dL Final  08/10/2019 03:10 AM 2.99 (H) 0.61 - 1.24 mg/dL Final  08/09/2019 05:45 AM 2.96 (H) 0.61 - 1.24 mg/dL Final  08/08/2019 05:30 AM 3.27 (H) 0.61 - 1.24 mg/dL Final  08/07/2019 05:46 AM 2.98 (H) 0.61 - 1.24 mg/dL Final  08/06/2019 04:50 AM 2.96 (H) 0.61 - 1.24 mg/dL Final  08/05/2019 05:01 AM 3.02 (H) 0.61 - 1.24 mg/dL Final  08/04/2019 06:12 PM 3.06 (H) 0.61 - 1.24 mg/dL Final  08/01/2019 03:32 AM 3.34 (H) 0.61 - 1.24 mg/dL Final  07/31/2019 10:19 AM 3.48 (H) 0.61 - 1.24 mg/dL Final  07/27/2019 04:04 PM 3.58 (H) 0.61 - 1.24 mg/dL Final  07/21/2019 10:10 AM 3.62 (H) 0.61 - 1.24 mg/dL Final  04/21/2019 04:10 PM 3.12 (H) 0.61 - 1.24 mg/dL Final  03/26/2019 02:20 PM 3.02 (H) 0.61 - 1.24 mg/dL Final  02/11/2019 11:21 AM 2.34 (H) 0.76 - 1.27 mg/dL Final  02/24/2018 11:42 AM 1.27 0.76 - 1.27 mg/dL Final  11/05/2017 11:26 AM 0.93 0.40 - 1.50 mg/dL Final  07/26/2017 09:49 PM 1.20 0.61 - 1.24 mg/dL Final  02/26/2017 09:42 AM 0.97 0.76 - 1.27 mg/dL Final  02/22/2017 04:47 AM 1.08 0.61 - 1.24 mg/dL Final  02/21/2017 10:54 AM 1.34 (H) 0.61 - 1.24 mg/dL Final  12/20/2016 02:33 PM 1.23 0.76 - 1.27 mg/dL Final  10/03/2016 01:22 AM  1.13 0.61 - 1.24 mg/dL Final  05/14/2016 03:52 PM 1.10 0.76 - 1.27 mg/dL Final  09/29/2015 11:13 AM 0.94 0.76 - 1.27 mg/dL Final  09/12/2015 01:56 PM 0.90 0.61 - 1.24 mg/dL Final  09/12/2015 01:47 PM 1.10 0.61 - 1.24 mg/dL Final     PMHx:   Past Medical History:  Diagnosis Date  . Anemia   . Arthritis    past hx   . Blindness    right eye  . Cataract    removed both eyes  . Dehydration   . Diabetes ([place])   . Glaucoma   . History of CVA (cerebrovascular accident) 09/13/2015  . History of urinary retention   . Hyperlipidemia   . Hypertension   . Pernicious anemia 02/24/2018  . Stroke [facility])    2017- March  . Tachycardia 08/26/2017  . Tubular adenoma of colon 02/2017  .  Weight loss, non-intentional 08/26/2017   10 lbs between 6/18 & 2/19    Past Surgical History:  Procedure Laterality Date  . CATARACT EXTRACTION, BILATERAL    . COLONOSCOPY    . POLYPECTOMY    . REFRACTIVE SURGERY  10/2017  . TEE WITHOUT CARDIOVERSION N/A 09/14/2015   Procedure: TRANSESOPHAGEAL ECHOCARDIOGRAM (TEE);  Surgeon: Larey Dresser, MD;  Location: [facility] ENDOSCOPY;  Service: Cardiovascular;  Laterality: N/A;  . UPPER GASTROINTESTINAL ENDOSCOPY      Family Hx:  Family History  Problem Relation Age of Onset  . Hypertension Mother   . Hyperlipidemia Mother   . Hyperlipidemia Father   . Colon cancer Neg Hx   . Colon polyps Neg Hx   . Esophageal cancer Neg Hx   . Rectal cancer Neg Hx   . Stomach cancer Neg Hx     Social History:  reports that he has quit smoking. He quit smokeless tobacco use about 41 years ago.  His smokeless tobacco use included chew. He reports that he does not drink alcohol or use drugs.  Allergies: No Known Allergies  Medications: Prior to Admission medications   Medication Sig Start Date End Date Taking? Authorizing Provider  amLODipine (NORVASC) 10 MG tablet Take 1 tablet (10 mg total) by mouth daily. 07/22/19  Yes [name], Rylee, MD  aspirin 81 MG tablet Take 1 tablet (81 mg total) by mouth daily. Internal Medicine Program place on hold until patient requests to fill 01/27/16  Yes Jule Ser, DO  atorvastatin (LIPITOR) 80 MG tablet Take 1 tablet (80 mg total) by mouth at bedtime. IM program 07/22/19  Yes [name], Rylee, MD  dorzolamide-timolol (COSOPT) 22.3-6.8 MG/ML ophthalmic solution Place 1 drop into the right eye 2 (two) times daily.  01/16/18  Yes [provider]  ferrous gluconate (FERGON) 324 MG tablet Take 324 mg by mouth daily with breakfast.   Yes [provider]  finasteride (PROSCAR) 5 MG tablet Take 5 mg by mouth daily. 08/12/19  Yes [provider]  furosemide (LASIX) 40 MG tablet TAKE 1 TABLET (40 MG TOTAL)  BY MOUTH 2 (TWO) TIMES DAILY. 09/04/19 09/03/20 Yes [name], Rylee, MD  LOKELMA 10 g PACK packet Take 1 packet by mouth once.  09/16/19  Yes [provider]  metoprolol tartrate (LOPRESSOR) 25 MG tablet Take 0.5 tablets (12.5 mg total) by mouth 2 (two) times daily. 07/15/19 09/17/19 Yes [name], Rylee, MD  sodium bicarbonate 650 MG tablet Take 650 mg by mouth 2 (two) times daily.   Yes [provider]  tamsulosin (FLOMAX) 0.4 MG CAPS capsule TAKE 1 CAPSULE (0.4  MG TOTAL) BY MOUTH DAILY AFTER SUPPER. 08/24/19  Yes [name], Rylee, MD  vitamin B-12 (CYANOCOBALAMIN) 1000 MCG tablet Take 1,000 mcg by mouth daily.   Yes [provider]  ACCU-CHEK FASTCLIX LANCETS MISC Check blood sugar up to 7 times a week as instructed 08/13/18   Isabelle Course, MD  Blood Glucose Monitoring Suppl (ACCU-CHEK GUIDE) w/Device KIT 1 each by Does not apply route daily. Check blood sugar as instructed up to 7 times a week 08/13/18   Isabelle Course, MD  glucose blood (ACCU-CHEK GUIDE) test strip Check blood sugar up to 7 times a week as instructed 08/13/18   Isabelle Course, MD    I have reviewed the patient's reported prior to admission and current medications.  Labs:  BMP Latest Ref Rng & Units 09/17/2019 09/16/2019 08/13/2019  Glucose 70 - 99 mg/dL 165(H) 179(H) 129(H)  BUN 8 - 23 mg/dL 34(H) 35(H) 34(H)  Creatinine 0.61 - 1.24 mg/dL 3.58(H) 3.66(H) 2.63(H)  BUN/Creat Ratio 10 - 24 - - -  Sodium 135 - 145 mmol/L 140 141 135  Potassium 3.5 - 5.1 mmol/L 5.0 5.5(H) 4.4  Chloride 98 - 111 mmol/L 106 107 105  CO2 22 - 32 mmol/L 20(L) 22 21(L)  Calcium 8.9 - 10.3 mg/dL 9.0 9.1 8.3(L)    Urinalysis    Component Value Date/Time   COLORURINE YELLOW 09/17/2019 1259   APPEARANCEUR CLEAR 09/17/2019 1259   LABSPEC 1.011 09/17/2019 1259   PHURINE 7.0 09/17/2019 1259   GLUCOSEU 50 (A) 09/17/2019 1259   HGBUR NEGATIVE 09/17/2019 1259   BILIRUBINUR NEGATIVE 09/17/2019 1259   KETONESUR NEGATIVE 09/17/2019 1259    PROTEINUR >=300 (A) 09/17/2019 1259   NITRITE NEGATIVE 09/17/2019 1259   LEUKOCYTESUR NEGATIVE 09/17/2019 1259     ROS:  Pertinent items noted in HPI and remainder of comprehensive ROS otherwise negative.  Physical Exam: Vitals:   09/17/19 1430 09/17/19 1445  BP: (!) 160/88 (!) 168/83  Pulse: 69 73  Resp: 14 16  Temp:    SpO2: 95% 96%     General: Adult male in bed in no acute distress at rest HEENT: Normocephalic atraumatic Eyes: Extraocular movements intact sclera anicteric Neck: Supple trachea mid Heart: S1-S2 no rub Lungs upper lobes are clear with decreased breath sounds at the bases.  Patient is unlabored on room air Abdomen: Soft normal bowel sounds and has suprapubic versus lower abdominal tenderness Extremities: Trace edema lower extremities Skin: No rash on extremities exposed there is no skin weeping Neuro: Alert and oriented x3 provides a history and follows command Genitourinary currently has a condom cath Foley not yet in place  Assessment/Plan:  # Acute kidney injury - Retention noted.  Could also represent CKD progression.  - foley is ordered - lasix once now as below.  Hold other scheduled lasix for now   # CKD stage IV  - Felt secondary to DM and HTN with reported neg serologic work-up per primary nephrologist - Recent Cr 2.8 on 2/12 outpatient labs at [place]  # urinary retention - Foley catheter is ordered - consider neuro imaging.  had been referred to spine for eval of same per outpatient charting  # Hyperkalemia - Improved today and noted order for [facility]  # Anemia normocytic  - Undergoing work-up.  Some degree may be secondary to CKD   - Note has been on iron however no ESA has been given because he is being worked up for malignancy - there was concern for renal  mass but no mass noted on today's CT - Recommend SPEP, UPEP and free light chains on discussion with primary nephrologist.   See these are ordered per primary  - have ordered random  UPEP (random urine) - code [id number] - PRBC's per primary team   # Bilateral pleural effusions - lasix once now   # Hypertension - Lasix once  - Continue current regimen   # weight loss  - team is working up for possible malignancy  # Possible Enterocolitis  - Per primary team   Claudia Desanctis 09/17/2019 4:51 PM

## 2019-09-17 NOTE — Assessment & Plan Note (Signed)
CKD stage 4: Patient has CKD thought likely to chronic hypertension. Reports he follows with Dr.Upton and recently started on Iron supplementation. He associates feeling tired Patient reports it is over the counter and hard to find, I expect it is a polysaccharide formulation of iron.  Patients Iron studies 1 month ago show saturation ratio 41% and Ferritin 273. Iron is usually given to patients with Ferritin less than 500 and Tsat < /= to 25% before giving ESA.  - His BMP showed potassium 5.5 and Cr worsened from 2.63 to 3.66. Calculated GFR is 24 to 16. BUN stable at 35.  Patient reports good urine output.  Plan:  - I will contact patient's nephrologist and discuss worsening kidney function - 10 g Lokelma once for hyperkalemia

## 2019-09-18 ENCOUNTER — Encounter: Payer: Self-pay | Admitting: Internal Medicine

## 2019-09-18 DIAGNOSIS — N049 Nephrotic syndrome with unspecified morphologic changes: Secondary | ICD-10-CM

## 2019-09-18 DIAGNOSIS — R634 Abnormal weight loss: Secondary | ICD-10-CM

## 2019-09-18 DIAGNOSIS — R935 Abnormal findings on diagnostic imaging of other abdominal regions, including retroperitoneum: Secondary | ICD-10-CM

## 2019-09-18 DIAGNOSIS — R339 Retention of urine, unspecified: Secondary | ICD-10-CM

## 2019-09-18 DIAGNOSIS — N184 Chronic kidney disease, stage 4 (severe): Secondary | ICD-10-CM

## 2019-09-18 DIAGNOSIS — Z960 Presence of urogenital implants: Secondary | ICD-10-CM

## 2019-09-18 LAB — COMPREHENSIVE METABOLIC PANEL
ALT: 10 U/L (ref 0–44)
AST: 16 U/L (ref 15–41)
Albumin: 2.7 g/dL — ABNORMAL LOW (ref 3.5–5.0)
Alkaline Phosphatase: 62 U/L (ref 38–126)
Anion gap: 16 — ABNORMAL HIGH (ref 5–15)
BUN: 32 mg/dL — ABNORMAL HIGH (ref 8–23)
CO2: 21 mmol/L — ABNORMAL LOW (ref 22–32)
Calcium: 9.2 mg/dL (ref 8.9–10.3)
Chloride: 103 mmol/L (ref 98–111)
Creatinine, Ser: 3.62 mg/dL — ABNORMAL HIGH (ref 0.61–1.24)
GFR calc Af Amer: 19 mL/min — ABNORMAL LOW (ref >60)
GFR calc non Af Amer: 16 mL/min — ABNORMAL LOW (ref >60)
Glucose, Bld: 125 mg/dL — ABNORMAL HIGH (ref 70–99)
Potassium: 4.7 mmol/L (ref 3.5–5.1)
Sodium: 140 mmol/L (ref 135–145)
Total Bilirubin: 0.5 mg/dL (ref 0.3–1.2)
Total Protein: 5.8 g/dL — ABNORMAL LOW (ref 6.5–8.1)

## 2019-09-18 LAB — PROTIME-INR
INR: 1.6 — ABNORMAL HIGH (ref 0.8–1.2)
Prothrombin Time: 18.5 seconds — ABNORMAL HIGH (ref 11.4–15.2)

## 2019-09-18 LAB — HIV ANTIBODY (ROUTINE TESTING W REFLEX): HIV Screen 4th Generation wRfx: NONREACTIVE

## 2019-09-18 LAB — CBC
HCT: 21.8 % — ABNORMAL LOW (ref 39.0–52.0)
Hemoglobin: 7.1 g/dL — ABNORMAL LOW (ref 13.0–17.0)
MCH: 30.1 pg (ref 26.0–34.0)
MCHC: 32.6 g/dL (ref 30.0–36.0)
MCV: 92.4 fL (ref 80.0–100.0)
Platelets: 315 10*3/uL (ref 150–400)
RBC: 2.36 MIL/uL — ABNORMAL LOW (ref 4.22–5.81)
RDW: 13.6 % (ref 11.5–15.5)
WBC: 6.6 10*3/uL (ref 4.0–10.5)
nRBC: 0 % (ref 0.0–0.2)

## 2019-09-18 LAB — GLUCOSE, CAPILLARY
Glucose-Capillary: 121 mg/dL — ABNORMAL HIGH (ref 70–99)
Glucose-Capillary: 91 mg/dL (ref 70–99)
Glucose-Capillary: 126 mg/dL — ABNORMAL HIGH (ref 70–99)
Glucose-Capillary: 125 mg/dL — ABNORMAL HIGH (ref 70–99)

## 2019-09-18 LAB — T4, FREE: Free T4: 1.25 ng/dL — ABNORMAL HIGH (ref 0.61–1.12)

## 2019-09-18 LAB — KAPPA/LAMBDA LIGHT CHAINS
Kappa free light chain: 113.5 mg/L — ABNORMAL HIGH (ref 3.3–19.4)
Kappa, lambda light chain ratio: 1.59 (ref 0.26–1.65)
Lambda free light chains: 71.3 mg/L — ABNORMAL HIGH (ref 5.7–26.3)

## 2019-09-18 MED ORDER — FUROSEMIDE 40 MG PO TABS
40.0000 mg | ORAL_TABLET | Freq: Two times a day (BID) | ORAL | Status: DC
  Administered 2019-09-18 – 2019-09-21 (×7): 40 mg via ORAL
  Filled 2019-09-18 (×7): qty 1

## 2019-09-18 MED ORDER — HYDROXYZINE HCL 25 MG PO TABS
25.0000 mg | ORAL_TABLET | Freq: Once | ORAL | Status: AC
  Administered 2019-09-18: 25 mg via ORAL
  Filled 2019-09-18: qty 1

## 2019-09-18 MED ORDER — DARBEPOETIN ALFA 150 MCG/0.3ML IJ SOSY
150.0000 ug | PREFILLED_SYRINGE | INTRAMUSCULAR | Status: DC
  Administered 2019-09-18: 150 ug via SUBCUTANEOUS
  Filled 2019-09-18 (×2): qty 0.3

## 2019-09-18 NOTE — Progress Notes (Signed)
02 sat 83-88% on room air while sleeping, 02 applied at 2l and 02 up to 91% and 94% on 3l via [place]. Will notify md and cont to monitor.

## 2019-09-18 NOTE — Progress Notes (Signed)
NAME:  Dennis Macias, MRN:  [id number], DOB:  1950-08-29, LOS: 1 ADMISSION DATE:  09/17/2019  Subjective  Notes poor sleep overnight due to abdominal sensation.  Objective   Blood pressure (!) 168/82, pulse 70, temperature 98 F (36.7 C), temperature source Oral, resp. rate 18, height 6' (1.829 m), weight 78 kg, SpO2 92 %.     Intake/Output Summary (Last 24 hours) at 09/18/2019 0546 Last data filed at 09/18/2019 0330 Gross per 24 hour  Intake 456 ml  Output 975 ml  Net -519 ml   Filed Weights   09/17/19 0553 09/17/19 1708  Weight: 80 kg 78 kg    Examination: GENERAL: in no acute distress CARDIAC: heart RRR. No peripheral edema.  PULMONARY: acyanotic. Lung sounds clear to auscultation. ABDOMEN: soft. Nontender to palpation.  Nondistended. No hepatosplenomegaly appreciated. SKIN: no rash or lesions on limited exam   Significant Diagnostic Tests:  3/11 CT AP> moderate bilateral effusions with overlying atelectasis. Wall thickening of the distal and terminal ilium and adjacent colon suggesting inflammatory/infectious process. Air in bladder. Anasarca  Prior Pertinent Diagnostic Tests:  2019 bone marrow bx>The bone marrow is generally normocellular for age with trilineage hematopoiesis. There is subtle dysgranulopoiesis present with no increase in blastic cells. Significant dyspoiesis is not seen in the erythroid or megakaryocytic cell lines. The myeloid changes are limited and not considered specific or diagnostic of a myelodysplastic process and may represent a secondary change. The peripheral blood shows abnormal red cell morphology with increased number of acanthocytes. This is typically seen in patients with liver disease, hypothyroidism, malnutrition, abetalipoproteinemia, etc.  2019 Bone marrow Cytogenetics> normal  07/14/19 renal US>Focal echogenic lesion in the inferior aspect of the left kidney. 08/05/19 MRI a/p> motion artifact. No renal lesion noted. Iron depositions in the  spleen and liver appreciated.  09/2015 TEE> mild LVH. Normal systolic and diastolic function. No valvular abnormalities.  Labs    CBC Latest Ref Rng & Units 09/17/2019 09/16/2019 08/13/2019  WBC 4.0 - 10.5 K/uL 6.3 6.5 6.0  Hemoglobin 13.0 - 17.0 g/dL 7.2(L) 7.5(L) 7.6(L)  Hematocrit 39.0 - 52.0 % 22.6(L) 23.4(L) 23.0(L)  Platelets 150 - 400 K/uL 320 321 322   BMP Latest Ref Rng & Units 09/17/2019 09/16/2019 08/13/2019  Glucose 70 - 99 mg/dL 165(H) 179(H) 129(H)  BUN 8 - 23 mg/dL 34(H) 35(H) 34(H)  Creatinine 0.61 - 1.24 mg/dL 3.58(H) 3.66(H) 2.63(H)  BUN/Creat Ratio 10 - 24 - - -  Sodium 135 - 145 mmol/L 140 141 135  Potassium 3.5 - 5.1 mmol/L 5.0 5.5(H) 4.4  Chloride 98 - 111 mmol/L 106 107 105  CO2 22 - 32 mmol/L 20(L) 22 21(L)  Calcium 8.9 - 10.3 mg/dL 9.0 9.1 8.3(L)    Summary  Dennis Macias is a 69 yo gentleman with rapidly progressing CKD with nephrotic syndrome, bladder outlet obstruction, 32mo hx of unintentional weight less, hematuria, normocytic normochromic anemia, HTN, T2DM who was admitted to [facility] on 3/11 for 1d history of lower abdominal discomfort and diaphoresis found to have bowel thickening and air in his bladder wall along with progressively worse renal function.  Assessment & Plan:  Active Problems:   Anasarca  Rapidly progressing CKD IV with nephrotic syndrome. Thought to be attributable to HTN/DM. Follows with Dr. Hollie Salk outpatient. GFR 58>16 in a little over a year. Cr increase 2.8>3.6 since 2/12. Previously obtain renal US had shown a renal lesion however has not been present on follow up imaging. -SPEP ordered on admission -No change in  renal function Urinary retention. Follows with urology for this. Unclear etiology but was recommended for neuro imaging by urology. Last PSA 3.4.  Was requiring a foley continuously over the past 21mo and just recently had it removed last week following voiding test at urology. Patient reports to have been peeing ok however had about 400cc  post void here on admission.  Assessment: suspect worsening renal function is at least partly attributable to urinary retention. The abnormal lower abdominal "fluttering" possibly bladder spasm from overextension. Plan Continue foley catheter Diuresis per nephrology Will continue to follow renal labs.  Bilateral pleural effusions. Anasarca.  Remains on room air but did have some accessory muscle use on admission. Diuresis per nephrology. Will need to monitor closely and consider thoracentesis if respiratory status declines.  -Would also consider obtaining repeat echo outpatient since last done in 2017 with new onset of symptoms.  Bowel wall thickening. Seen on 3/11 CT a/p.  Inflammatory vs infectious vs edema. No particular indication that this is infectious--no pain, no leukocytosis, no fevers. Pt did report having chills 2 nights ago however this also occurred last night here in the hospital and no fever is documented. Last colonoscopy in 2019>2 adenomatous polyps without dysplastic features. Last EGD 2019> severe gastritis with ulceration; +H.pylori Decreased appetite. Family reporting weight loss as well however on chart review, weights appear about stable since at least 2019. Iron depositions in the liver and spleen. seen on 07/2019 MRI. No personal or FH of hematological disorder and prior iron panel does not indicate iron overload to suggest hemachromatosis. Does not receive regular blood transfusions. Assessment: Overall decline in the past year is highly concerning for a malignancy. Has undergone significant workup in the past including bone marrow bx, abdominal imaging, etc. Only significant finding so far was the renal lesion stated above however no lesion has been present on follow up imaging since then. Plan: Will consult GI to get their input on the iron depositions as well as bowel wall thickening. Pt may benefit from repeat EGD given the degree of gastritis seen on prior. Appreciate  GI recommendations.   Chronic Normocytic Anemia. Stable at baseline. Significant decline since January 2021--10>7.2. previous hematuria was present however UA on admission neg for blood. The progression of his CKD appears to be in parallel with anemia progression suggesting some correlation but unsure if this is the entire picture.  Best practice:  CODE STATUS: Full Diet: renal DVT for prophylaxis: heparin Dispo: pending further evaluation. Consider outpatient echo. Will need TSH/T4 rechecked 4-6w after discharge.   [name], MD INTERNAL MEDICINE RESIDENT PGY-1 PAGER #: [phone] 09/18/19  5:46 AM

## 2019-09-18 NOTE — Consult Note (Addendum)
[facility] Consult: 1:40 PM 09/18/2019  LOS: 1 day    Referring Provider: [name], MD.  Resident.    Primary Care Physician:  [name], MD Primary Gastroenterologist:  Dr. Fuller Plan     Reason for Consultation: Anorexia.  Nonspecific bowel wall thickening, hepatic iron deposition per imaging.   HPI: Dennis Macias is a 69 y.o. male.  PMH CVA 2019.  Pernicious anemia, on oral iron and B12 supplements.  Bone marrow aspiration 2019. IDDM.  Renal mass.  CKD.  BPH gustatory hyperhidrosis.  02/2017 colonoscopy.  Screening study.  4 polyps removed, largest 33m removed piecemeal.  Ptholgy all TAs w/o HGD.  Internal hemorrhoids. 03/2018 colonoscopy.  For polyp surveillance. Two, 7 mm polyps, at sigmoid and IC valve.  Resected.  Path TAs w/o HGD.  Post polypectomy scar in rectum.  Suggest 3-year follow-up with extended bowel prep. 11/2017 EGD.  For anemia, nodule 15# weight loss, diarrhea..  Solitary gastric polyp resected.  Gastritis, biopsied.  2 nonbleeding gastric AVMs.  Examined duodenum normal.  Gastric path:  severe chronic gastritis with ulceration, H pylori present, no metaplasia.  Polyp showed severe chronic gastritis with H. pylori and no metaplasia. H. pylori he was treated with 2 weeks Pylera.  2 back to back admissions this year starting late January after a fall.  Diagnosed with worrisome left kidney mass, elevated PSA, hematuria, urinary retention. 07/14/2019 renal ultrasound demonstrated left kidney lesion.   08/05/2019 MRI abdomen pelvis: Limited study due to anasarca and respiratory motion.  Small to moderate volume abdominal ascites and bowel wall edema C/W anasarca.  Liver and spleen with low signal suggesting iron deposition. Anemia studies of 08/06/2019 with normal iron, reduced TIBC, increased  iron saturation, normal ferritin. B12 ok  Second admission in 2021 following a fall for weight loss, anorexia, deconditioning, weakness, anasarca in the setting of stage IV CKD. At discharge plan was for nephrology follow-up, recommendation for MRI in 3 months after resolution of anasarca, outpatient urologic follow-up.  A Foley catheter was in place but was removed about a week ago.  At present there looks to be no plans for biopsy of the renal mass just repeat MRI in 3 months.  Presented back to the ED yesterdaty and admitted with c/o increased sweating, now not just associated with meal intake.  Several months of fatigue, weakness.  Ongoing anorexia without nausea, vomiting, diarrhea.  Reports improvement in lower extremity edema.  Patient says his appetite is fair and he has not had reduced p.o. intake in recent months.  Overall eats less since he had his stroke in 2019.  No bloody or black stools, daily bowel movements.  No previous blood transfusions.  Dr. UHollie Salk nephrologist, recently initiated oral iron in addition to his chronic oral B12.    09/17/2010 CTAP w/o contrast: Moderate bilateral pleural effusion/ATX.  Wall thickening at the distal and terminal ileum, ascending colon.  Diffuse body wall edema and small amount free pelvic fluid suggesting anasarca.  Extensive vascular calcifications. Portable CXR shows hazy opacities at the bases, corresponding to pleural effusions/ATX  on CT. Labs reveal progression of CKD, now stage 4.  Albumin low at 2.7.  LFTs normal. Hb 7.1, MCV 92.4.  Dr. Moshe Cipro, nephrologist, is planning follow-up iron studies and initiation of ESA.    Past Medical History:  Diagnosis Date  . Anemia   . Arthritis    past hx   . Blindness    right eye  . Cataract    removed both eyes  . Dehydration   . Diabetes ([place])   . Glaucoma   . History of CVA (cerebrovascular accident) 09/13/2015  . History of urinary retention   . Hyperlipidemia   . Hypertension   .  Pernicious anemia 02/24/2018  . Stroke [facility])    2017- March  . Tachycardia 08/26/2017  . Tubular adenoma of colon 02/2017  . Weight loss, non-intentional 08/26/2017   10 lbs between 6/18 & 2/19    Past Surgical History:  Procedure Laterality Date  . CATARACT EXTRACTION, BILATERAL    . COLONOSCOPY    . POLYPECTOMY    . REFRACTIVE SURGERY  10/2017  . TEE WITHOUT CARDIOVERSION N/A 09/14/2015   Procedure: TRANSESOPHAGEAL ECHOCARDIOGRAM (TEE);  Surgeon: Larey Dresser, MD;  Location: [facility];  Service: Cardiovascular;  Laterality: N/A;  . UPPER GASTROINTESTINAL ENDOSCOPY      Prior to Admission medications   Medication Sig Start Date End Date Taking? Authorizing Provider  amLODipine (NORVASC) 10 MG tablet Take 1 tablet (10 mg total) by mouth daily. 07/22/19  Yes [name], Rylee, MD  aspirin 81 MG tablet Take 1 tablet (81 mg total) by mouth daily. Internal Medicine Program place on hold until patient requests to fill 01/27/16  Yes Jule Ser, DO  atorvastatin (LIPITOR) 80 MG tablet Take 1 tablet (80 mg total) by mouth at bedtime. IM program 07/22/19  Yes [name], Rylee, MD  dorzolamide-timolol (COSOPT) 22.3-6.8 MG/ML ophthalmic solution Place 1 drop into the right eye 2 (two) times daily.  01/16/18  Yes [provider]  ferrous gluconate (FERGON) 324 MG tablet Take 324 mg by mouth daily with breakfast.   Yes [provider]  finasteride (PROSCAR) 5 MG tablet Take 5 mg by mouth daily. 08/12/19  Yes [provider]  furosemide (LASIX) 40 MG tablet TAKE 1 TABLET (40 MG TOTAL) BY MOUTH 2 (TWO) TIMES DAILY. 09/04/19 09/03/20 Yes [name], Rylee, MD  LOKELMA 10 g PACK packet Take 1 packet by mouth once.  09/16/19  Yes [provider]  metoprolol tartrate (LOPRESSOR) 25 MG tablet Take 0.5 tablets (12.5 mg total) by mouth 2 (two) times daily. 07/15/19 09/17/19 Yes [name], Rylee, MD  sodium bicarbonate 650 MG tablet Take 650 mg by mouth 2 (two) times daily.   Yes  [provider]  tamsulosin (FLOMAX) 0.4 MG CAPS capsule TAKE 1 CAPSULE (0.4 MG TOTAL) BY MOUTH DAILY AFTER SUPPER. 08/24/19  Yes [name], Rylee, MD  vitamin B-12 (CYANOCOBALAMIN) 1000 MCG tablet Take 1,000 mcg by mouth daily.   Yes [provider]  ACCU-CHEK FASTCLIX LANCETS MISC Check blood sugar up to 7 times a week as instructed 08/13/18   Isabelle Course, MD  Blood Glucose Monitoring Suppl (ACCU-CHEK GUIDE) w/Device KIT 1 each by Does not apply route daily. Check blood sugar as instructed up to 7 times a week 08/13/18   Isabelle Course, MD  glucose blood (ACCU-CHEK GUIDE) test strip Check blood sugar up to 7 times a week as instructed 08/13/18   Isabelle Course, MD    Scheduled Meds: . amLODipine  10 mg Oral Daily  . Chlorhexidine Gluconate Cloth  6 each Topical Daily  . dorzolamide-timolol  1 drop Right Eye BID  . finasteride  5 mg Oral Daily  . heparin  5,000 Units Subcutaneous Q8H  . insulin aspart  0-5 Units Subcutaneous QHS  . insulin aspart  0-6 Units Subcutaneous TID WC  . metoprolol tartrate  12.5 mg Oral BID  . sodium chloride flush  3 mL Intravenous Once  . tamsulosin  0.4 mg Oral QPC supper   Infusions:  PRN Meds:    Allergies as of 09/17/2019  . (No Known Allergies)    Family History  Problem Relation Age of Onset  . Hypertension Mother   . Hyperlipidemia Mother   . Hyperlipidemia Father   . Colon cancer Neg Hx   . Colon polyps Neg Hx   . Esophageal cancer Neg Hx   . Rectal cancer Neg Hx   . Stomach cancer Neg Hx     Social History   Socioeconomic History  . Marital status: Married    Spouse name: Not on file  . Number of children: Not on file  . Years of education: Not on file  . Highest education level: Not on file  Occupational History  . Not on file  Tobacco Use  . Smoking status: Former Research scientist (life sciences)  . Smokeless tobacco: Former Systems developer    Types: Chew    Quit date: 07/09/1978  . Tobacco comment: quit 1 year ago  Substance and Sexual  Activity  . Alcohol use: No    Alcohol/week: 1.0 standard drinks    Types: 1 Cans of beer per week    Comment: quit last march/2017  . Drug use: No  . Sexual activity: Not on file  Other Topics Concern  . Not on file  Social History Narrative  . Not on file   Social Determinants of Health   Financial Resource Strain:   . Difficulty of Paying Living Expenses:   Food Insecurity:   . Worried About Charity fundraiser in the Last Year:   . Arboriculturist in the Last Year:   Transportation Needs:   . Film/video editor (Medical):   Marland Kitchen Lack of Transportation (Non-Medical):   Physical Activity:   . Days of Exercise per Week:   . Minutes of Exercise per Session:   Stress:   . Feeling of Stress :   Social Connections:   . Frequency of Communication with Friends and Family:   . Frequency of Social Gatherings with Friends and Family:   . Attends Religious Services:   . Active Member of Clubs or Organizations:   . Attends Archivist Meetings:   Marland Kitchen Marital Status:   Intimate Partner Violence:   . Fear of Current or Ex-Partner:   . Emotionally Abused:   Marland Kitchen Physically Abused:   . Sexually Abused:     REVIEW OF SYSTEMS: Constitutional: Fatigue, weakness. ENT:  No nose bleeds Pulm: Denies shortness of breath or cough. CV:  No palpitations, no chest pain.  Patient never was bothered that much by lower extremity edema despite notes mentioning it as a problem. GU:  No hematuria, no frequency GI: No dysphagia. Heme: Denies excessive or unusual bleeding or bruising. Transfusions: None. Neuro: Has issues with balance, uses a cane at home to prevent falling.  No headaches, no seizures, no syncope. Derm:  No itching, no rash or sores.  Endocrine:  No sweats or chills.  No polyuria or dysuria  Immunization: Reviewed immunization records.  Patient is not aware of having been vaccinated for Covid. Travel:  None beyond local counties in last few months.    PHYSICAL EXAM: Vital  signs in last 24 hours: Vitals:   09/18/19 0553 09/18/19 1212  BP: (!) 149/71 (!) 147/78  Pulse: 82 65  Resp: 17 18  Temp: 98.1 F (36.7 C) 98.3 F (36.8 C)  SpO2: 95% 96%   Wt Readings from Last 3 Encounters:  09/17/19 78 kg  09/16/19 76.5 kg  08/13/19 82.1 kg    General: Chronically ill appearing, comfortable, alert. Head: No facial asymmetry or swelling. Eyes: No scleral icterus, no conjunctival pallor.  EOMI. Ears: Hard of hearing Nose: No congestion or discharge Mouth: Oropharynx moist, pink, clear.  Tongue midline.  No teeth. Neck: No JVD, no masses, no thyromegaly. Lungs: Clear but greatly reduced at the bases bilaterally.  No labored breathing.  No cough. Heart: RRR.  No MRG.  S1, S2 present. Abdomen: Soft without distention.  Bowel sounds normal, slightly reduced.  There is a rounded lesion subcutaneously in about the 8 o'clock position near umbilicus.  This is mobile, nontender and feels like it is in the subcutaneous or fatty layer of the abdomen.  No HSM, bruits, hernias. Rectal: Deferred Musc/Skeltl: Sarcopenia.   Extremities: No CCE. Neurologic: Alert.  Oriented x3.  Does not have a lot of knowledge of medicines he takes.  Able to move all 4 limbs without tremor, strength not tested. Skin: The skin on the shins bilaterally is hyperpigmented in a reticular pattern. Tattoos: None observed. Nodes: No cervical or inguinal adenopathy. Psych: Quiet, cooperative.    Intake/Output from previous day: 03/11 0701 - 03/12 0700 In: 456 [P.O.:456] Out: 1275 [Urine:1275] Intake/Output this shift: No intake/output data recorded.  LAB RESULTS: Recent Labs    09/16/19 1555 09/17/19 0604 09/18/19 0610  WBC 6.5 6.3 6.6  HGB 7.5* 7.2* 7.1*  HCT 23.4* 22.6* 21.8*  PLT 321 320 315   BMET Lab Results  Component Value Date   NA 140 09/18/2019   NA 140 09/17/2019   NA 141 09/16/2019   K 4.7 09/18/2019   K 5.0 09/17/2019   K 5.5 (H) 09/16/2019   CL 103 09/18/2019    CL 106 09/17/2019   CL 107 09/16/2019   CO2 21 (L) 09/18/2019   CO2 20 (L) 09/17/2019   CO2 22 09/16/2019   GLUCOSE 125 (H) 09/18/2019   GLUCOSE 165 (H) 09/17/2019   GLUCOSE 179 (H) 09/16/2019   BUN 32 (H) 09/18/2019   BUN 34 (H) 09/17/2019   BUN 35 (H) 09/16/2019   CREATININE 3.62 (H) 09/18/2019   CREATININE 3.58 (H) 09/17/2019   CREATININE 3.66 (H) 09/16/2019   CALCIUM 9.2 09/18/2019   CALCIUM 9.0 09/17/2019   CALCIUM 9.1 09/16/2019   LFT Recent Labs    09/17/19 0604 09/18/19 0610  PROT 6.2* 5.8*  ALBUMIN 2.8* 2.7*  AST 17 16  ALT 12 10  ALKPHOS 62 62  BILITOT 0.7 0.5   PT/INR Lab Results  Component Value Date   INR 1.6 (H) 09/18/2019   INR 1.27 09/19/2017   INR 1.27 09/12/2015   Hepatitis Panel No results for input(s): HEPBSAG, HCVAB, HEPAIGM, HEPBIGM in the last 72 hours. C-Diff No components found for: CDIFF Lipase     Component Value Date/Time   LIPASE 18 09/17/2019 0604    Drugs of Abuse     Component Value Date/Time   LABOPIA NONE DETECTED 07/31/2019 1145  COCAINSCRNUR NONE DETECTED 07/31/2019 1145   LABBENZ NONE DETECTED 07/31/2019 1145   AMPHETMU NONE DETECTED 07/31/2019 1145   THCU NONE DETECTED 07/31/2019 1145   LABBARB NONE DETECTED 07/31/2019 1145     RADIOLOGY STUDIES: CT ABDOMEN PELVIS WO CONTRAST  Result Date: 09/17/2019 CLINICAL DATA:  Lower abdominal pain. EXAM: CT ABDOMEN AND PELVIS WITHOUT CONTRAST TECHNIQUE: Multidetector CT imaging of the abdomen and pelvis was performed following the standard protocol without IV contrast. COMPARISON:  CT scan 07/31/2019 FINDINGS: Lower chest: Moderate-sized bilateral pleural effusions with moderate overlying atelectasis. The heart is normal in size. No pericardial effusion. Hepatobiliary: No hepatic lesions are identified without contrast. No intra or extrahepatic biliary dilatation. The gallbladder is grossly normal. Pancreas: No mass, inflammation or ductal dilatation. Spleen: Normal size. No  focal lesions. Adrenals/Urinary Tract: The adrenal glands and kidneys are unremarkable. No renal, ureteral or bladder calculi or mass. There is air noted in the bladder. This may be due to recent instrumentation or catheterization. I do not see any findings suspicious for a colovesical fistula. Recommend clinical correlation. Stomach/Bowel: The stomach, duodenum and most of the small bowel appear normal. The distal and terminal ileum demonstrate wall thickening. The adjacent ascending colon also demonstrates wall thickening. No findings for obstruction. The appendix is normal. The remainder of the colon is unremarkable. No findings for diverticulosis or acute diverticulitis. Vascular/Lymphatic: Extensive vascular calcifications likely related to diabetes. No aneurysm. No abdominal or pelvic adenopathy. Reproductive: The prostate gland is mildly enlarged. The seminal vesicles appear normal. Other: Mild mesenteric edema, small amount of free pelvic fluid and diffuse body wall edema suggesting anasarca. Musculoskeletal: No acute bony findings. The pubic symphysis appears fused. Possible prior trauma. IMPRESSION: 1. Moderate-sized bilateral pleural effusions with overlying atelectasis. 2. Wall thickening involving the distal and terminal ileum and adjacent ascending colon suggesting inflammatory or infectious process. I do not see any findings for obstruction or perforation. 3. Air in the bladder could be due to recent instrumentation or catheterization. I do not see any findings suspicious for a colovesical fistula. 4. Diffuse body wall edema and small amount of free pelvic fluid suggesting anasarca. 5. Extensive vascular calcifications likely related to diabetes. Electronically Signed   By: Marijo Sanes M.D.   On: 09/17/2019 10:56   DG Chest Port 1 View  Result Date: 09/17/2019 CLINICAL DATA:  Pleural effusion. EXAM: PORTABLE CHEST 1 VIEW COMPARISON:  Chest radiograph 07/31/2019. Lung bases from abdominal CT  earlier this day. FINDINGS: Hazy opacities at the lung bases corresponding to pleural effusions and atelectasis as seen on CT. Mild cardiomegaly with unchanged mediastinal contours. Aortic atherosclerosis. Mild vascular congestion without edema. No pneumothorax. No acute osseous abnormalities are seen. IMPRESSION: 1. Hazy opacities at the lung bases corresponding to pleural effusions and atelectasis as seen on CT. 2. Mild cardiomegaly. Vascular congestion without edema. Aortic Atherosclerosis (ICD10-I70.0). Electronically Signed   By: Keith Rake M.D.   On: 09/17/2019 13:48     IMPRESSION:   *    Normocytic anemia.  Acute on chronic.  PMH includes pernicious anemia. Oral iron (recent), oral B12 (long-standing) at home. Renal planning to initiate ESA and recheck iron stores. Transferrin saturation calculated at 41% based on labs from 07/2019.    *     Hepatic iron deposition in liver and spleen noted on 08/05/2019 MRI abdomen. No hx of hemochromatosis.  No imaging evidence for cirrhosis.  Ferritin 6 weeks ago was not excessive at 273   *   Weight loss ~  30# over last 2 years per pt.  Note wt of 73.5 kg in 03/2019, 78 kg now.  Pt says po intake is stable, overall reduced since 2019 CVA  *     Nonspecific bowel wall edema, suspect this is due to hypoalbuminemia.  Anasarca noted on 07/2019 MRI.    Up to date on TA polyp surveillance, next colonoscopy due 03/2022  *     Urinary retention, BPH.  Foley catheter pulled by Urology within past (?2) weeks.    *     Left renal mass per 07/2019 ultrasound.  Nothing seen on visually compromised  MRI and yesterday's non-con CT. Awaiting notes from urology to scan into Epic record.     *      AKI, advanced CKD.  Non-oliguric.  Renal following.  *     Gustatory hyperhydrosis, long-standing.  Increase in non-gustatory diaphoresis in recent days.    *   H Pylori positive gastritis and gastric polyp in 11/2017.  Completed Pylera Rx.  No outpt H2 blocker or  PPI, and GI ROS does not indicate need for acid supression.      PLAN:     *   Added Folic acid level to AM labs, as I do not see it has been checked.  Repeat iron studies in AM.   Also to be collected is kappa/lambda light chains to r/o MM.     Azucena Freed  09/18/2019, 1:40 PM Phone [phone]   Attending physician's note   I have taken a history, examined the patient and reviewed the chart. I agree with the Advanced Practitioner's note, impression and recommendations.  69 year old male with history of pernicious anemia, CKD with anorexia and anasarca  Low albumin likely secondary to poor nutritional status /CKD.  Check prealbumin New renal mass noted on ultrasound, follow-up with urology to exclude renal cell carcinoma  Bowel wall thickening is nonspecific and is related to hypoalbuminemia.  Slightly increased iron deposits in spleen and liver.  Low iron saturation 19% and ferritin level is not elevated.  Not consistent with hemochromatosis.   Iron deposition could be secondary to oral iron supplements or if he received any IV iron recently that can also lead to the findings.  Complains of constipation, start MiraLAX 1 capful daily  No further GI work-up is recommended at this point  GI will sign off, available if have any questions or concerns.  Damaris Hippo , MD [phone]

## 2019-09-18 NOTE — Progress Notes (Signed)
Pt c/o "stomach going up and down, sweating, feeling nauseated and having some pain." md made aware and in room to evaluate pt at this time. Will cont to monitor.

## 2019-09-18 NOTE — Progress Notes (Signed)
Internal Medicine Clinic Attending ° °Case discussed with Dr. Steen  at the time of the visit.  We reviewed the resident’s history and exam and pertinent patient test results.  I agree with the assessment, diagnosis, and plan of care documented in the resident’s note.  °

## 2019-09-18 NOTE — Progress Notes (Signed)
Subjective:  Feels better-  Abdominal pain appears to have subsided- 1275 of urine- crt stable Objective Vital signs in last 24 hours: Vitals:   09/17/19 2200 09/18/19 0132 09/18/19 0553 09/18/19 1212  BP: (!) 155/84 (!) 168/82 (!) 149/71 (!) 147/78  Pulse: 72 70 82 65  Resp:  18 17 18   Temp:  98 F (36.7 C) 98.1 F (36.7 C) 98.3 F (36.8 C)  TempSrc:  Oral Oral Oral  SpO2: 93% 92% 95% 96%  Weight:      Height:       Weight change: -2 kg  Intake/Output Summary (Last 24 hours) at 09/18/2019 1336 Last data filed at 09/18/2019 9741 Gross per 24 hour  Intake 220 ml  Output 1100 ml  Net -880 ml    Assessment/ Plan: Pt is a 69 y.o. yo male with DM, HTN and CKD- follows with [facility] - BL crt around 3 who was admitted on 09/17/2019 with  Abdominal discomfort, volume overload and A on CRF with hyperkalemia  Assessment/Plan: 1. Renal-  Pretty advanced CKD at baseline-  Felt to be due to DM and HTN, also BOO.  Now with mild A on CRF-  Possible recurrent bladder outlet obstruction now with foley - good UOP and crt stable.  No indications for HD 2. HTN/vol- BP is anything high- given one dose IV lasix yesterday afternoon.  Interesting that CT shows pleural effusion and indication of soft tissue edema but clinically he does not appear to be that overloaded. Probably some third spacing given low alfumin.  But since BP is high I think he at least needs some lasix-  Will start with 40 PO BID.  Cont amlodipine 10, lopressor 12.5 and flomax   3. Anemia-  Pretty severe-  Need to check iron stores and will give ESA  4. Hyperkalemia-  Resolved - given lokelma yesterday as well  5. Weight loss-  Being worked up for possible malignancy including myeloma screen    Louis Meckel    Labs: Basic Metabolic Panel: Recent Labs  Lab 09/16/19 1555 09/17/19 0604 09/18/19 0610  NA 141 140 140  K 5.5* 5.0 4.7  CL 107 106 103  CO2 22 20* 21*  GLUCOSE 179* 165* 125*  BUN 35* 34* 32*  CREATININE  3.66* 3.58* 3.62*  CALCIUM 9.1 9.0 9.2   Liver Function Tests: Recent Labs  Lab 09/17/19 0604 09/18/19 0610  AST 17 16  ALT 12 10  ALKPHOS 62 62  BILITOT 0.7 0.5  PROT 6.2* 5.8*  ALBUMIN 2.8* 2.7*   Recent Labs  Lab 09/17/19 0604  LIPASE 18   No results for input(s): AMMONIA in the last 168 hours. CBC: Recent Labs  Lab 09/16/19 1555 09/17/19 0604 09/18/19 0610  WBC 6.5 6.3 6.6  HGB 7.5* 7.2* 7.1*  HCT 23.4* 22.6* 21.8*  MCV 95.1 95.0 92.4  PLT 321 320 315   Cardiac Enzymes: No results for input(s): CKTOTAL, CKMB, CKMBINDEX, TROPONINI in the last 168 hours. CBG: Recent Labs  Lab 09/17/19 1619 09/17/19 2153 09/18/19 0618 09/18/19 1106  GLUCAP 106* 123* 121* 91    Iron Studies: No results for input(s): IRON, TIBC, TRANSFERRIN, FERRITIN in the last 72 hours. Studies/Results: CT ABDOMEN PELVIS WO CONTRAST  Result Date: 09/17/2019 CLINICAL DATA:  Lower abdominal pain. EXAM: CT ABDOMEN AND PELVIS WITHOUT CONTRAST TECHNIQUE: Multidetector CT imaging of the abdomen and pelvis was performed following the standard protocol without IV contrast. COMPARISON:  CT scan 07/31/2019 FINDINGS: Lower chest: Moderate-sized  bilateral pleural effusions with moderate overlying atelectasis. The heart is normal in size. No pericardial effusion. Hepatobiliary: No hepatic lesions are identified without contrast. No intra or extrahepatic biliary dilatation. The gallbladder is grossly normal. Pancreas: No mass, inflammation or ductal dilatation. Spleen: Normal size. No focal lesions. Adrenals/Urinary Tract: The adrenal glands and kidneys are unremarkable. No renal, ureteral or bladder calculi or mass. There is air noted in the bladder. This may be due to recent instrumentation or catheterization. I do not see any findings suspicious for a colovesical fistula. Recommend clinical correlation. Stomach/Bowel: The stomach, duodenum and most of the small bowel appear normal. The distal and terminal ileum  demonstrate wall thickening. The adjacent ascending colon also demonstrates wall thickening. No findings for obstruction. The appendix is normal. The remainder of the colon is unremarkable. No findings for diverticulosis or acute diverticulitis. Vascular/Lymphatic: Extensive vascular calcifications likely related to diabetes. No aneurysm. No abdominal or pelvic adenopathy. Reproductive: The prostate gland is mildly enlarged. The seminal vesicles appear normal. Other: Mild mesenteric edema, small amount of free pelvic fluid and diffuse body wall edema suggesting anasarca. Musculoskeletal: No acute bony findings. The pubic symphysis appears fused. Possible prior trauma. IMPRESSION: 1. Moderate-sized bilateral pleural effusions with overlying atelectasis. 2. Wall thickening involving the distal and terminal ileum and adjacent ascending colon suggesting inflammatory or infectious process. I do not see any findings for obstruction or perforation. 3. Air in the bladder could be due to recent instrumentation or catheterization. I do not see any findings suspicious for a colovesical fistula. 4. Diffuse body wall edema and small amount of free pelvic fluid suggesting anasarca. 5. Extensive vascular calcifications likely related to diabetes. Electronically Signed   By: Marijo Sanes M.D.   On: 09/17/2019 10:56   DG Chest Port 1 View  Result Date: 09/17/2019 CLINICAL DATA:  Pleural effusion. EXAM: PORTABLE CHEST 1 VIEW COMPARISON:  Chest radiograph 07/31/2019. Lung bases from abdominal CT earlier this day. FINDINGS: Hazy opacities at the lung bases corresponding to pleural effusions and atelectasis as seen on CT. Mild cardiomegaly with unchanged mediastinal contours. Aortic atherosclerosis. Mild vascular congestion without edema. No pneumothorax. No acute osseous abnormalities are seen. IMPRESSION: 1. Hazy opacities at the lung bases corresponding to pleural effusions and atelectasis as seen on CT. 2. Mild cardiomegaly.  Vascular congestion without edema. Aortic Atherosclerosis (ICD10-I70.0). Electronically Signed   By: Keith Rake M.D.   On: 09/17/2019 13:48   Medications: Infusions:   Scheduled Medications: . amLODipine  10 mg Oral Daily  . Chlorhexidine Gluconate Cloth  6 each Topical Daily  . dorzolamide-timolol  1 drop Right Eye BID  . finasteride  5 mg Oral Daily  . heparin  5,000 Units Subcutaneous Q8H  . insulin aspart  0-5 Units Subcutaneous QHS  . insulin aspart  0-6 Units Subcutaneous TID WC  . metoprolol tartrate  12.5 mg Oral BID  . sodium chloride flush  3 mL Intravenous Once  . tamsulosin  0.4 mg Oral QPC supper    have reviewed scheduled and prn medications.  Physical Exam: General: chronically ill appearing Heart: RRR Lungs: mostly clear Abdomen: soft, non tender Extremities: no edema    09/18/2019,1:36 PM  LOS: 1 day

## 2019-09-19 LAB — CBC
HCT: 21.7 % — ABNORMAL LOW (ref 39.0–52.0)
Hemoglobin: 7.1 g/dL — ABNORMAL LOW (ref 13.0–17.0)
MCH: 30.3 pg (ref 26.0–34.0)
MCHC: 32.7 g/dL (ref 30.0–36.0)
MCV: 92.7 fL (ref 80.0–100.0)
Platelets: 310 10*3/uL (ref 150–400)
RBC: 2.34 MIL/uL — ABNORMAL LOW (ref 4.22–5.81)
RDW: 13.6 % (ref 11.5–15.5)
WBC: 5.4 10*3/uL (ref 4.0–10.5)
nRBC: 0 % (ref 0.0–0.2)

## 2019-09-19 LAB — GLUCOSE, CAPILLARY
Glucose-Capillary: 100 mg/dL — ABNORMAL HIGH (ref 70–99)
Glucose-Capillary: 122 mg/dL — ABNORMAL HIGH (ref 70–99)
Glucose-Capillary: 129 mg/dL — ABNORMAL HIGH (ref 70–99)
Glucose-Capillary: 145 mg/dL — ABNORMAL HIGH (ref 70–99)

## 2019-09-19 LAB — BASIC METABOLIC PANEL
Anion gap: 10 (ref 5–15)
BUN: 34 mg/dL — ABNORMAL HIGH (ref 8–23)
CO2: 23 mmol/L (ref 22–32)
Calcium: 8.8 mg/dL — ABNORMAL LOW (ref 8.9–10.3)
Chloride: 107 mmol/L (ref 98–111)
Creatinine, Ser: 3.73 mg/dL — ABNORMAL HIGH (ref 0.61–1.24)
GFR calc Af Amer: 18 mL/min — ABNORMAL LOW (ref >60)
GFR calc non Af Amer: 16 mL/min — ABNORMAL LOW (ref >60)
Glucose, Bld: 111 mg/dL — ABNORMAL HIGH (ref 70–99)
Potassium: 4.4 mmol/L (ref 3.5–5.1)
Sodium: 140 mmol/L (ref 135–145)

## 2019-09-19 LAB — IRON AND TIBC
Iron: 34 ug/dL — ABNORMAL LOW (ref 45–182)
Saturation Ratios: 17 % — ABNORMAL LOW (ref 17.9–39.5)
TIBC: 200 ug/dL — ABNORMAL LOW (ref 250–450)
UIBC: 166 ug/dL

## 2019-09-19 LAB — FERRITIN: Ferritin: 240 ng/mL (ref 24–336)

## 2019-09-19 MED ORDER — POLYETHYLENE GLYCOL 3350 17 G PO PACK
17.0000 g | PACK | Freq: Every day | ORAL | Status: DC
  Administered 2019-09-19 – 2019-09-21 (×3): 17 g via ORAL
  Filled 2019-09-19 (×3): qty 1

## 2019-09-19 MED ORDER — SODIUM CHLORIDE 0.9 % IV SOLN
510.0000 mg | Freq: Once | INTRAVENOUS | Status: AC
  Administered 2019-09-19: 510 mg via INTRAVENOUS
  Filled 2019-09-19: qty 17

## 2019-09-19 NOTE — Progress Notes (Signed)
   Subjective:   He had some mild sweating this morning with breakfast. He denies abdominal pain. He has not had recurrence of abdominal fluttering that he had yesterday. He denies pain or shortness of breath.   Objective:  Vital signs in last 24 hours: Vitals:   09/18/19 1212 09/18/19 1804 09/19/19 0055 09/19/19 0621  BP: (!) 147/78 (!) 144/63 (!) 152/78 (!) 165/86  Pulse: 65 66 63 72  Resp: 18 18 18 20   Temp: 98.3 F (36.8 C) 98.9 F (37.2 C) 98.4 F (36.9 C) 98.8 F (37.1 C)  TempSrc: Oral Oral Oral Oral  SpO2: 96% 92% 96% 91%  Weight:      Height:        Constitution: NAD, appears stated age 69: RRR, no m/r/g, no LE edema  Respiratory: CTA, no w/r/r, wearing [place] Abdominal: NTTP, soft, non-distended MSK: moving all extremities Neuro: normal affect, a&ox3 Skin: c/d/i   Assessment/Plan:  Principal Problem:   Anasarca associated with disorder of kidney Active Problems:   BPH with urinary obstruction   Weight loss, non-intentional   CKD (chronic kidney disease) stage 4, GFR 15-29 ml/min ([facility])  69yo male with rapidly progressive CKD IV, nephrotic syndrome, BOO, unintentional weight loss, normocytic anemia, HTN, TIIDM presenting with lower abdominal discomfort and diaphoresis admitted for bowel thickening and progression of his renal disease.   BPH with Urinary Retention AKI on CKD Stage IV Nephrotic Syndrome Progressive CKD thought to be secondary to HTN/DM. SPEP pending. Making good urine, will likely need to be discharged with foley catheter.   - f/u MM labs, FLC ratio wnl - f/u 5HIAA - started on lasix 40 mg iv bid per nephro - checking prealbumin - cont. flomax - cont. Foley cath  Bilateral Pleural Effusions Anasarca Overall does not appear fluid overloaded on exam, no LE edema. Pleural effusionson xr appear minimal although he continues to require on 3L   - lasix as above  - ambulate with O2  Bowel Wall Thickening Felt to be nonspecific and likely  related to patient's low albumin, per GI.   Normocytic Anemia  IDA - S/p iron infusion today for Fe 34, ferritin 240, iron saturation 17% - aranesp   HTN - cont. norvasc 10 mg, metop 12.5 mg bid  TIIDM  SSI very sensitive - not requiring   VTE: heparin IVF: none Diet: HH/CM Code: full   Dispo: Anticipated discharge in 1-2 days.   Molli Hazard A, DO 09/19/2019, 7:25 AM Pager: [phone]

## 2019-09-19 NOTE — Progress Notes (Signed)
Subjective:   Only 500 of urine recorded - crt a tiny bit worse-  Oxygen weaned off.  Pt asking about going home Objective Vital signs in last 24 hours: Vitals:   09/18/19 1212 09/18/19 1804 09/19/19 0055 09/19/19 0621  BP: (!) 147/78 (!) 144/63 (!) 152/78 (!) 165/86  Pulse: 65 66 63 72  Resp: 18 18 18 20   Temp: 98.3 F (36.8 C) 98.9 F (37.2 C) 98.4 F (36.9 C) 98.8 F (37.1 C)  TempSrc: Oral Oral Oral Oral  SpO2: 96% 92% 96% 91%  Weight:      Height:       Weight change:   Intake/Output Summary (Last 24 hours) at 09/19/2019 1025 Last data filed at 09/19/2019 0600 Gross per 24 hour  Intake --  Output 500 ml  Net -500 ml    Assessment/ Plan: Pt is a 69 y.o. yo male with DM, HTN and CKD- follows with [facility] - BL crt around 3 -  admitted on 09/17/2019 with  Abdominal discomfort, volume overload and A on CRF with hyperkalemia  Assessment/Plan: 1. Renal-  Pretty advanced CKD at baseline-  Felt to be due to DM and HTN, also history of BOO.  Now with mild A on CRF-  Possible recurrent bladder outlet obstruction now with foley - good UOP and crt fairly stable.  No indications for HD.  Probably will need to go home with foley and urology follow up.   Renal function could jus tbe progression of CKD.  If stays stable does not need to be inpatient-  Could be followed as OP  2. HTN/vol- BP is anything high- given one dose IV lasix  On 3/11.  Interesting that CT shows pleural effusion and indication of soft tissue edema but clinically he does not appear to be that overloaded. Probably some third spacing given low alfumin.  since BP is high  started  40 PO BID in addition to his amlodipine 10, lopressor 12.5 and flomax   3. Anemia-  Pretty severe-   iron stores low, replete and also giving ESA  4. Hyperkalemia-  Resolved -  5. Weight loss-  Being worked up for possible malignancy including myeloma screen - most in process    [place]: Basic Metabolic Panel: Recent Labs   Lab 09/17/19 0604 09/18/19 0610 09/19/19 0418  NA 140 140 140  K 5.0 4.7 4.4  CL 106 103 107  CO2 20* 21* 23  GLUCOSE 165* 125* 111*  BUN 34* 32* 34*  CREATININE 3.58* 3.62* 3.73*  CALCIUM 9.0 9.2 8.8*   Liver Function Tests: Recent Labs  Lab 09/17/19 0604 09/18/19 0610  AST 17 16  ALT 12 10  ALKPHOS 62 62  BILITOT 0.7 0.5  PROT 6.2* 5.8*  ALBUMIN 2.8* 2.7*   Recent Labs  Lab 09/17/19 0604  LIPASE 18   No results for input(s): AMMONIA in the last 168 hours. CBC: Recent Labs  Lab 09/16/19 1555 09/16/19 1555 09/17/19 0604 09/18/19 0610 09/19/19 0418  WBC 6.5   < > 6.3 6.6 5.4  HGB 7.5*   < > 7.2* 7.1* 7.1*  HCT 23.4*   < > 22.6* 21.8* 21.7*  MCV 95.1  --  95.0 92.4 92.7  PLT 321   < > 320 315 310   < > = values in this interval not displayed.   Cardiac Enzymes: No results for input(s): CKTOTAL, CKMB, CKMBINDEX, TROPONINI in the last 168 hours. CBG: Recent Labs  Lab 09/18/19 0618 09/18/19 1106 09/18/19 1612 09/18/19 2142 09/19/19 0619  GLUCAP 121* 91 126* 125* 100*    Iron Studies:  Recent Labs    09/19/19 0418  IRON 34*  TIBC 200*  FERRITIN 240   Studies/Results: CT ABDOMEN PELVIS WO CONTRAST  Result Date: 09/17/2019 CLINICAL DATA:  Lower abdominal pain. EXAM: CT ABDOMEN AND PELVIS WITHOUT CONTRAST TECHNIQUE: Multidetector CT imaging of the abdomen and pelvis was performed following the standard protocol without IV contrast. COMPARISON:  CT scan 07/31/2019 FINDINGS: Lower chest: Moderate-sized bilateral pleural effusions with moderate overlying atelectasis. The heart is normal in size. No pericardial effusion. Hepatobiliary: No hepatic lesions are identified without contrast. No intra or extrahepatic biliary dilatation. The gallbladder is grossly normal. Pancreas: No mass, inflammation or ductal dilatation. Spleen: Normal size. No focal lesions. Adrenals/Urinary Tract: The adrenal glands and kidneys are unremarkable. No renal, ureteral or bladder  calculi or mass. There is air noted in the bladder. This may be due to recent instrumentation or catheterization. I do not see any findings suspicious for a colovesical fistula. Recommend clinical correlation. Stomach/Bowel: The stomach, duodenum and most of the small bowel appear normal. The distal and terminal ileum demonstrate wall thickening. The adjacent ascending colon also demonstrates wall thickening. No findings for obstruction. The appendix is normal. The remainder of the colon is unremarkable. No findings for diverticulosis or acute diverticulitis. Vascular/Lymphatic: Extensive vascular calcifications likely related to diabetes. No aneurysm. No abdominal or pelvic adenopathy. Reproductive: The prostate gland is mildly enlarged. The seminal vesicles appear normal. Other: Mild mesenteric edema, small amount of free pelvic fluid and diffuse body wall edema suggesting anasarca. Musculoskeletal: No acute bony findings. The pubic symphysis appears fused. Possible prior trauma. IMPRESSION: 1. Moderate-sized bilateral pleural effusions with overlying atelectasis. 2. Wall thickening involving the distal and terminal ileum and adjacent ascending colon suggesting inflammatory or infectious process. I do not see any findings for obstruction or perforation. 3. Air in the bladder could be due to recent instrumentation or catheterization. I do not see any findings suspicious for a colovesical fistula. 4. Diffuse body wall edema and small amount of free pelvic fluid suggesting anasarca. 5. Extensive vascular calcifications likely related to diabetes. Electronically Signed   By: Marijo Sanes M.D.   On: 09/17/2019 10:56   DG Chest Port 1 View  Result Date: 09/17/2019 CLINICAL DATA:  Pleural effusion. EXAM: PORTABLE CHEST 1 VIEW COMPARISON:  Chest radiograph 07/31/2019. Lung bases from abdominal CT earlier this day. FINDINGS: Hazy opacities at the lung bases corresponding to pleural effusions and atelectasis as seen on  CT. Mild cardiomegaly with unchanged mediastinal contours. Aortic atherosclerosis. Mild vascular congestion without edema. No pneumothorax. No acute osseous abnormalities are seen. IMPRESSION: 1. Hazy opacities at the lung bases corresponding to pleural effusions and atelectasis as seen on CT. 2. Mild cardiomegaly. Vascular congestion without edema. Aortic Atherosclerosis (ICD10-I70.0). Electronically Signed   By: Keith Rake M.D.   On: 09/17/2019 13:48   Medications: Infusions:   Scheduled Medications: . amLODipine  10 mg Oral Daily  . Chlorhexidine Gluconate Cloth  6 each Topical Daily  . darbepoetin (ARANESP) injection - NON-DIALYSIS  150 mcg Subcutaneous Q Fri-1800  . dorzolamide-timolol  1 drop Right Eye BID  . finasteride  5 mg Oral Daily  . furosemide  40 mg Oral BID  . heparin  5,000 Units Subcutaneous Q8H  . insulin aspart  0-5 Units Subcutaneous QHS  . insulin aspart  0-6 Units Subcutaneous TID WC  . metoprolol  tartrate  12.5 mg Oral BID  . sodium chloride flush  3 mL Intravenous Once  . tamsulosin  0.4 mg Oral QPC supper    have reviewed scheduled and prn medications.  Physical Exam: General: chronically ill appearing- NAD Heart: RRR Lungs: mostly clear Abdomen: soft, non tender Extremities: no edema    09/19/2019,10:25 AM  LOS: 2 days

## 2019-09-19 NOTE — Progress Notes (Signed)
Ambulated pt with pulse ox on room air. Pt desaturated to 88% and sustained. Pt back in bed on room air saturating at 93%.

## 2019-09-20 ENCOUNTER — Inpatient Hospital Stay (HOSPITAL_COMMUNITY): Payer: Medicare PPO

## 2019-09-20 ENCOUNTER — Encounter: Payer: Self-pay | Admitting: Internal Medicine

## 2019-09-20 DIAGNOSIS — R338 Other retention of urine: Secondary | ICD-10-CM

## 2019-09-20 DIAGNOSIS — D509 Iron deficiency anemia, unspecified: Secondary | ICD-10-CM

## 2019-09-20 LAB — CBC
HCT: 22.4 % — ABNORMAL LOW (ref 39.0–52.0)
Hemoglobin: 7.3 g/dL — ABNORMAL LOW (ref 13.0–17.0)
MCH: 30 pg (ref 26.0–34.0)
MCHC: 32.6 g/dL (ref 30.0–36.0)
MCV: 92.2 fL (ref 80.0–100.0)
Platelets: 298 10*3/uL (ref 150–400)
RBC: 2.43 MIL/uL — ABNORMAL LOW (ref 4.22–5.81)
RDW: 13.7 % (ref 11.5–15.5)
WBC: 6.5 10*3/uL (ref 4.0–10.5)
nRBC: 0 % (ref 0.0–0.2)

## 2019-09-20 LAB — RENAL FUNCTION PANEL
Albumin: 2.3 g/dL — ABNORMAL LOW (ref 3.5–5.0)
Anion gap: 10 (ref 5–15)
BUN: 34 mg/dL — ABNORMAL HIGH (ref 8–23)
CO2: 23 mmol/L (ref 22–32)
Calcium: 8.5 mg/dL — ABNORMAL LOW (ref 8.9–10.3)
Chloride: 108 mmol/L (ref 98–111)
Creatinine, Ser: 3.67 mg/dL — ABNORMAL HIGH (ref 0.61–1.24)
GFR calc Af Amer: 19 mL/min — ABNORMAL LOW (ref >60)
GFR calc non Af Amer: 16 mL/min — ABNORMAL LOW (ref >60)
Glucose, Bld: 106 mg/dL — ABNORMAL HIGH (ref 70–99)
Phosphorus: 4.5 mg/dL (ref 2.5–4.6)
Potassium: 4.3 mmol/L (ref 3.5–5.1)
Sodium: 141 mmol/L (ref 135–145)

## 2019-09-20 LAB — GLUCOSE, CAPILLARY
Glucose-Capillary: 121 mg/dL — ABNORMAL HIGH (ref 70–99)
Glucose-Capillary: 170 mg/dL — ABNORMAL HIGH (ref 70–99)
Glucose-Capillary: 134 mg/dL — ABNORMAL HIGH (ref 70–99)
Glucose-Capillary: 102 mg/dL — ABNORMAL HIGH (ref 70–99)

## 2019-09-20 LAB — PARATHYROID HORMONE, INTACT (NO CA): PTH: 145 pg/mL — ABNORMAL HIGH (ref 15–65)

## 2019-09-20 MED ORDER — SODIUM CHLORIDE 0.9 % IV SOLN
510.0000 mg | Freq: Once | INTRAVENOUS | Status: DC
  Filled 2019-09-20: qty 17

## 2019-09-20 NOTE — Progress Notes (Signed)
   Subjective:   Feeling well. Denies shortness of breath, abdominal pain. Was able to eat some breakfast this morning without issue. Asking if he can go home today.   Objective:  Vital signs in last 24 hours: Vitals:   09/19/19 1320 09/19/19 1810 09/20/19 0012 09/20/19 0536  BP: 130/69 (!) 143/72 (!) 153/85 (!) 165/84  Pulse: 66 69 64 72  Resp: '18 18 18 18  '$ Temp: 98.3 F (36.8 C) 98.3 F (36.8 C) 98.2 F (36.8 C) 98 F (36.7 C)  TempSrc: Oral Oral Oral   SpO2: 94% 92% 92% 94%  Weight:      Height:       Constitution: NAD, appears stated age Cardio: RRR, no m/r/g, no LE edema  Respiratory: decreased breath sounds bases, no w/r/r, on RA Abdominal: NTTP, soft, non-distended MSK: moving all extremities Neuro: normal affect, a&ox3 Skin: c/d/i   Assessment/Plan:  Principal Problem:   Anasarca associated with disorder of kidney Active Problems:   BPH with urinary obstruction   Weight loss, non-intentional   CKD (chronic kidney disease) stage 4, GFR 15-29 ml/min ([facility])  69yo male w/rapidly progressive CKD IV, nephrotic syndrome, BOO, unintentional weight loss, normocytic anemia, HTN, TIIDM presenting with lower abdominal discomfort and diaphoresis admitted for bowel wall thickening and progression of his renal disease   BPH with Urinary Retention AKI on CKD Stage IV  Nephrotic Syndrome Progressive CKD thought to be secondary to HTN/DM. Multiple myeloma work-up still pending, free light chain ratio normal. He is diuresing, net negative 1.9 L and does not appear volume up although continues to have fluid on his lungs. This is likely secondary to his hypoalbuminemia. Renal function has remained stable with 40 lasix po bid.   - f/u nephrology recs - cont. Lasix - will need to f/u with urology for foley removal after discharge - f/u MM labs and 5HIAA - cont. flomax   Bilateral Pleural Effusion Repeat chest xr showing persistent bilateral pleural effusions. Patient is wanting  to go home but will discuss possibly staying to have thoracentesis. Overall breathing has improved and he is on room air but requires supplemental oxygen while ambulating.   - will discuss thora  - lasix as above   Normocytic Anemia Iron Deficiency Anemia - s/p iron infusion yesterday  - Aranesp   HTN - cont. norvasc '10mg'$ , metop 12.'5mg'$  bid, lasix   TIIDM SSI as needed   VTE: heparin IVF: none Diet: renal/CM Code: full   Dispo: Anticipated discharge today or tomorrow.   Molli Hazard A, DO 09/20/2019, 6:27 AM Pager: [phone]

## 2019-09-20 NOTE — Progress Notes (Signed)
Subjective:   At least 1150 of urine recorded - crt stable-  Oxygen weaned off.  Pt asking about going home.  Considering doing thoracentesis from what I hear ?   Objective Vital signs in last 24 hours: Vitals:   09/19/19 1320 09/19/19 1810 09/20/19 0012 09/20/19 0536  BP: 130/69 (!) 143/72 (!) 153/85 (!) 165/84  Pulse: 66 69 64 72  Resp: 18 18 18 18   Temp: 98.3 F (36.8 C) 98.3 F (36.8 C) 98.2 F (36.8 C) 98 F (36.7 C)  TempSrc: Oral Oral Oral   SpO2: 94% 92% 92% 94%  Weight:      Height:       Weight change:   Intake/Output Summary (Last 24 hours) at 09/20/2019 1028 Last data filed at 09/20/2019 0549 Gross per 24 hour  Intake 550 ml  Output 1150 ml  Net -600 ml    Assessment/ Plan: Pt is a 69 y.o. yo male with DM, HTN and CKD- follows [facility] - BL crt around 3 -  admitted on 09/17/2019 with  Abdominal discomfort, volume overload and A on CRF with hyperkalemia  Assessment/Plan: 1. Renal-  Pretty advanced CKD at baseline-  Felt to be due to DM and HTN, also history of BOO.  Now with mild A on CRF-  Possible recurrent bladder outlet obstruction now with foley - good UOP and crt fairly stable.  No indications for HD.  Probably will need to go home with foley and urology follow up.   Renal function could jus tbe progression of CKD.  If stays stable does not need to be inpatient-  Could be followed as OP  2. HTN/vol- BP is anything high- given one dose IV lasix  On 3/11.  Interesting that CT shows pleural effusion and indication of soft tissue edema but clinically he does not appear to be that overloaded. Probably some third spacing given low albumin.  since BP is high  started  40 PO BID in addition to his amlodipine 10, lopressor 12.5 and flomax.  No change today    3. Anemia-  Pretty severe-   iron stores low, gave dose of feraheme on 3/13 and also giving ESA  4. Hyperkalemia-  Resolved -  5. Weight loss-  Being worked up for possible malignancy including myeloma screen - most in  process- kappa to lamda ratio 1.59  Renal will sign of from seeing daily.  Would continue lasix 40 PO BID when goes home, also need to send home with foley and urology follow up.  If stays until 3/16 could get another dose of feraheme 510 mg (cannot get til then)  But does not need to stay specifically for that.  Needs ESA arranged as OP - we can arrange thru our office as well as follow up with Dr. Hollie Salk.  Call with questions   Louis Meckel    Labs: Basic Metabolic Panel: Recent Labs  Lab 09/18/19 0610 09/19/19 0418 09/20/19 0154  NA 140 140 141  K 4.7 4.4 4.3  CL 103 107 108  CO2 21* 23 23  GLUCOSE 125* 111* 106*  BUN 32* 34* 34*  CREATININE 3.62* 3.73* 3.67*  CALCIUM 9.2 8.8* 8.5*  PHOS  --   --  4.5   Liver Function Tests: Recent Labs  Lab 09/17/19 0604 09/18/19 0610 09/20/19 0154  AST 17 16  --   ALT 12 10  --   ALKPHOS 62 62  --   BILITOT 0.7 0.5  --  PROT 6.2* 5.8*  --   ALBUMIN 2.8* 2.7* 2.3*   Recent Labs  Lab 09/17/19 0604  LIPASE 18   No results for input(s): AMMONIA in the last 168 hours. CBC: Recent Labs  Lab 09/16/19 1555 09/16/19 1555 09/17/19 0604 09/17/19 0604 09/18/19 0610 09/19/19 0418 09/20/19 0154  WBC 6.5   < > 6.3   < > 6.6 5.4 6.5  HGB 7.5*   < > 7.2*   < > 7.1* 7.1* 7.3*  HCT 23.4*   < > 22.6*   < > 21.8* 21.7* 22.4*  MCV 95.1  --  95.0  --  92.4 92.7 92.2  PLT 321   < > 320   < > 315 310 298   < > = values in this interval not displayed.   Cardiac Enzymes: No results for input(s): CKTOTAL, CKMB, CKMBINDEX, TROPONINI in the last 168 hours. CBG: Recent Labs  Lab 09/19/19 0619 09/19/19 1121 09/19/19 1643 09/19/19 2128 09/20/19 0536  GLUCAP 100* 122* 129* 145* 121*    Iron Studies:  Recent Labs    09/19/19 0418  IRON 34*  TIBC 200*  FERRITIN 240   Studies/Results: No results found. Medications: Infusions:   Scheduled Medications: . amLODipine  10 mg Oral Daily  . Chlorhexidine Gluconate Cloth  6  each Topical Daily  . darbepoetin (ARANESP) injection - NON-DIALYSIS  150 mcg Subcutaneous Q Fri-1800  . dorzolamide-timolol  1 drop Right Eye BID  . finasteride  5 mg Oral Daily  . furosemide  40 mg Oral BID  . heparin  5,000 Units Subcutaneous Q8H  . insulin aspart  0-5 Units Subcutaneous QHS  . insulin aspart  0-6 Units Subcutaneous TID WC  . metoprolol tartrate  12.5 mg Oral BID  . polyethylene glycol  17 g Oral Daily  . sodium chloride flush  3 mL Intravenous Once  . tamsulosin  0.4 mg Oral QPC supper    have reviewed scheduled and prn medications.  Physical Exam: General: chronically ill appearing- NAD- very pleasant  Heart: RRR Lungs: mostly clear Abdomen: soft, non tender Extremities: really no edema    09/20/2019,10:28 AM  LOS: 3 days

## 2019-09-20 NOTE — Evaluation (Signed)
Physical Therapy Evaluation Patient Details Name: Dennis Macias MRN: [id number] DOB: 07-Oct-1950 Today's Date: 09/20/2019   History of Present Illness  69yo male w/rapidly progressive CKD IV, nephrotic syndrome, BOO, unintentional weight loss, normocytic anemia, HTN, TIIDM presenting with lower abdominal discomfort and diaphoresis admitted for bowel wall thickening and progression of his renal disease   Clinical Impression   Pt admitted with above diagnosis. 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 Pt will benefit from skilled PT to increase their independence and safety with mobility to allow discharge to the venue listed below.    Notable that he was able to walk approx 100 ft in the hallway on Room Air with O2 sats predominantly in the mid 90s; Fatigued post walk, likely related to low Hgb.     Follow Up Recommendations Supervision/Assistance - 24 hour;Home health PT(followed by Outpatient PT/Cardiopulmonary Rehab for endurance )    Equipment Recommendations  (pt has quad cane, RW with 2 wheels, wheelchair)  No equipment recommended   Recommendations for Other Services OT consult     Precautions / Restrictions Precautions Precautions: Fall      Mobility  Bed Mobility Overal bed mobility: Modified Independent             General bed mobility comments: Uses bedrails for supine to sit  Transfers Overall transfer level: Needs assistance Equipment used: Rolling walker (2 wheeled) Transfers: Sit to/from Stand Sit to Stand: Mod assist(From low  surface)         General transfer comment: moderate assist to power up with excessive use of glutes to compensate for quad weakness. Braced back of legs against bed when standing for support/balance   Ambulation/Gait Ambulation/Gait assistance: Min guard Gait Distance (Feet): 100 Feet Assistive device: Rolling walker (2 wheeled) Gait Pattern/deviations: Decreased weight shift to right;Decreased stride length     General Gait Details: R leg weakness contributing to stance instability, cues for RW use and safety; cues to self-monitor for activity tolerance   Stairs            Wheelchair Mobility    Modified Rankin (Stroke Patients Only)       Balance Overall balance assessment: Needs assistance   Sitting balance-Leahy Scale: Good       Standing balance-Leahy Scale: Poor                               Pertinent Vitals/Pain Pain Assessment: No/denies pain    Home Living Family/patient expects to be discharged to:: Private residence Living Arrangements: Spouse/significant other Available Help at Discharge: Family Type of Home: Apartment Home Access: Stairs to enter Entrance Stairs-Rails: (a rail exists; need more details) Entrance Stairs-Number of Steps: 6 Home Layout: One level;Able to live on main level with bedroom/bathroom Home Equipment: Bedside commode;Wheelchair - manual;Cane - Set designer - 2 wheels      Prior Function Level of Independence: Needs assistance   Gait / Transfers Assistance Needed: Recently has had difficulty rising from toilet, bed, low surfaces. Typically walks with quad cane or RW  Hand Dominance        Extremity/Trunk Assessment   Upper Extremity Assessment Upper Extremity Assessment: Defer to OT evaluation    Lower Extremity Assessment Lower Extremity Assessment: Generalized weakness(R weaker than L) RLE Deficits / Details: MMT: quad 3+/5 :Hip flexor 3+/5: Hamstring 3+/5 (tested in sitting,  contraction broke with moderate resistance       Communication   Communication: HOH  Cognition Arousal/Alertness: Awake/alert Behavior During Therapy: WFL for tasks assessed/performed Overall Cognitive Status: Within Functional Limits for tasks assessed                                        General Comments General comments (skin integrity, edema, etc.): ambulated on room air and O2 stats remained greater than or equal to 92%    Exercises     Assessment/Plan    PT Assessment Patient needs continued PT services  PT Problem List Decreased strength;Decreased activity tolerance;Decreased mobility;Cardiopulmonary status limiting activity       PT Treatment Interventions      PT Goals (Current goals can be found in the Care Plan section)  Acute Rehab PT Goals Patient Stated Goal: (Play with grandchildren)    Frequency     Barriers to discharge     09/20/19 1400  PT Plan  PT Frequency (ACUTE ONLY) Min 3X/week  PT Treatment/Interventions (ACUTE ONLY) DME instruction;Gait training;Stair training;Functional mobility training;Therapeutic activities;Therapeutic exercise;Balance training;Neuromuscular re-education;Patient/family education         Co-evaluation               AM-PAC PT "6 Clicks" Mobility  Outcome Measure Help needed turning from your back to your side while in a flat bed without using bedrails?: None   Help needed moving to and from a bed to a chair (including a wheelchair)?: A Lot Help needed standing up from a chair using your arms (e.g., wheelchair or bedside chair)?: A Lot Help needed to walk in hospital room?: A Little   6 Click Score: 11    End of Session Equipment Utilized During Treatment: Gait belt Activity Tolerance: Patient tolerated treatment well;Patient limited by fatigue Patient left: in chair;with call bell/phone within reach Nurse Communication: Mobility status(O2 sats remained at acceptable levels on room air) PT  Visit Diagnosis: Unsteadiness on feet (R26.81);Other abnormalities of gait and mobility (R26.89);Muscle weakness (generalized) (M62.81)        Charges:    09/20/19 1100  PT Time Calculation  PT Start Time (ACUTE ONLY) 1050  PT Stop Time (ACUTE ONLY) 1123  PT Time Calculation (min) (ACUTE ONLY) 33 min  PT General Charges  $$ ACUTE PT VISIT 1 Visit  PT Evaluation  $PT Eval Moderate Complexity 1 Mod  PT Treatments  $Gait Training 8-22 mins               Roney Marion, [place]  Acute Rehabilitation Services Pager [phone] Office [place] 09/20/2019, 5:29 PM

## 2019-09-21 ENCOUNTER — Inpatient Hospital Stay (HOSPITAL_COMMUNITY): Payer: Medicare PPO

## 2019-09-21 DIAGNOSIS — J90 Pleural effusion, not elsewhere classified: Secondary | ICD-10-CM | POA: Diagnosis present

## 2019-09-21 DIAGNOSIS — R946 Abnormal results of thyroid function studies: Secondary | ICD-10-CM

## 2019-09-21 DIAGNOSIS — R630 Anorexia: Secondary | ICD-10-CM

## 2019-09-21 HISTORY — PX: IR THORACENTESIS ASP PLEURAL SPACE W/IMG GUIDE: IMG5380

## 2019-09-21 LAB — ALBUMIN, PLEURAL OR PERITONEAL FLUID: Albumin, Fluid: <1 g/dL

## 2019-09-21 LAB — RENAL FUNCTION PANEL
Albumin: 2.2 g/dL — ABNORMAL LOW (ref 3.5–5.0)
Anion gap: 10 (ref 5–15)
BUN: 33 mg/dL — ABNORMAL HIGH (ref 8–23)
CO2: 24 mmol/L (ref 22–32)
Calcium: 8.8 mg/dL — ABNORMAL LOW (ref 8.9–10.3)
Chloride: 107 mmol/L (ref 98–111)
Creatinine, Ser: 3.54 mg/dL — ABNORMAL HIGH (ref 0.61–1.24)
GFR calc Af Amer: 19 mL/min — ABNORMAL LOW (ref >60)
GFR calc non Af Amer: 17 mL/min — ABNORMAL LOW (ref >60)
Glucose, Bld: 90 mg/dL (ref 70–99)
Phosphorus: 4.5 mg/dL (ref 2.5–4.6)
Potassium: 4.2 mmol/L (ref 3.5–5.1)
Sodium: 141 mmol/L (ref 135–145)

## 2019-09-21 LAB — GLUCOSE, CAPILLARY
Glucose-Capillary: 93 mg/dL (ref 70–99)
Glucose-Capillary: 135 mg/dL — ABNORMAL HIGH (ref 70–99)
Glucose-Capillary: 108 mg/dL — ABNORMAL HIGH (ref 70–99)
Glucose-Capillary: 164 mg/dL — ABNORMAL HIGH (ref 70–99)

## 2019-09-21 LAB — CBC
HCT: 23.6 % — ABNORMAL LOW (ref 39.0–52.0)
Hemoglobin: 7.8 g/dL — ABNORMAL LOW (ref 13.0–17.0)
MCH: 30.5 pg (ref 26.0–34.0)
MCHC: 33.1 g/dL (ref 30.0–36.0)
MCV: 92.2 fL (ref 80.0–100.0)
Platelets: 301 10*3/uL (ref 150–400)
RBC: 2.56 MIL/uL — ABNORMAL LOW (ref 4.22–5.81)
RDW: 14 % (ref 11.5–15.5)
WBC: 6.3 10*3/uL (ref 4.0–10.5)
nRBC: 0.3 % — ABNORMAL HIGH (ref 0.0–0.2)

## 2019-09-21 LAB — BODY FLUID CELL COUNT WITH DIFFERENTIAL
Eos, Fluid: 0 %
Lymphs, Fluid: 69 %
Monocyte-Macrophage-Serous Fluid: 24 % — ABNORMAL LOW (ref 50–90)
Neutrophil Count, Fluid: 7 % (ref 0–25)
Total Nucleated Cell Count, Fluid: 108 cu mm (ref 0–1000)

## 2019-09-21 LAB — PROTEIN, PLEURAL OR PERITONEAL FLUID: Total protein, fluid: <3 g/dL

## 2019-09-21 LAB — MISC LABCORP TEST (SEND OUT): Labcorp test code: 354928

## 2019-09-21 LAB — GRAM STAIN

## 2019-09-21 LAB — GLUCOSE, PLEURAL OR PERITONEAL FLUID: Glucose, Fluid: 123 mg/dL

## 2019-09-21 MED ORDER — LIDOCAINE HCL 1 % IJ SOLN
INTRAMUSCULAR | Status: AC
  Filled 2019-09-21: qty 20

## 2019-09-21 MED ORDER — LIDOCAINE HCL (PF) 1 % IJ SOLN
INTRAMUSCULAR | Status: DC | PRN
  Administered 2019-09-21: 5 mL

## 2019-09-21 MED ORDER — DARBEPOETIN ALFA 150 MCG/0.3ML IJ SOSY: 150.0000 ug | PREFILLED_SYRINGE | INTRAMUSCULAR | Status: DC

## 2019-09-21 NOTE — Progress Notes (Signed)
Physical Therapy Treatment Patient Details Name: Dennis Macias MRN: [id number] DOB: 1951/04/26 Today's Date: 09/21/2019    History of Present Illness 69yo male w/rapidly progressive CKD IV, nephrotic syndrome, BOO, unintentional weight loss, normocytic anemia, HTN, TIIDM presenting with lower abdominal discomfort and diaphoresis admitted for bowel wall thickening and progression of his renal disease; now s/p thoracentesis    PT Comments    Pt received in bed just back from getting thoracentesis and chest x-ray, agreeable to ambulation this afternoon. Pt walked 100 ft in hallway on room air with RW with min guard assist. He reported less fatigue today compared to yesterday's same distance and O2 sats decreased to 92% observed lowest. Noted plan for d/c home; PT agrees, recommend [facility] PT to continue work on strengthening and endurance.    Follow Up Recommendations  Supervision/Assistance - 24 hour;Home health PT(followed by Outpatient PT for endurance )     Equipment Recommendations  None recommended by PT(pt has quad cane, RW with 2 wheels, wheelchair)    Recommendations for Other Services OT consult     Precautions / Restrictions Precautions Precautions: Fall    Mobility  Bed Mobility Overal bed mobility: Modified Independent             General bed mobility comments: Uses bedrails for supine to sit  Transfers Overall transfer level: Needs assistance Equipment used: Rolling walker (2 wheeled) Transfers: Sit to/from Stand Sit to Stand: Mod assist         General transfer comment: moderate assist to power up with excessive use of glutes to compensate for quad weakness. Braced back of legs against bed when standing for support/balance   Ambulation/Gait Ambulation/Gait assistance: Min guard Gait Distance (Feet): 100 Feet Assistive device: Rolling walker (2 wheeled) Gait Pattern/deviations: Decreased weight shift to right;Decreased stride length     General Gait Details:  R leg weakness contributing to stance instability, cues for RW use and safety; cues to self-monitor for activity tolerance    Stairs             Wheelchair Mobility    Modified Rankin (Stroke Patients Only)       Balance     Sitting balance-Leahy Scale: Good       Standing balance-Leahy Scale: Poor                              Cognition Arousal/Alertness: Awake/alert Behavior During Therapy: WFL for tasks assessed/performed Overall Cognitive Status: Within Functional Limits for tasks assessed                                        Exercises      General Comments General comments (skin integrity, edema, etc.): ambulated on room air and O2 stats remained greater than or equal to 92%      Pertinent Vitals/Pain Pain Assessment: No/denies pain    Home Living                      Prior Function            PT Goals (current goals can now be found in the care plan section) Acute Rehab PT Goals Patient Stated Goal: go home Progress towards PT goals: Progressing toward goals    Frequency    Min 3X/week      PT Plan Current plan  remains appropriate    Co-evaluation              AM-PAC PT "6 Clicks" Mobility   Outcome Measure  Help needed turning from your back to your side while in a flat bed without using bedrails?: None Help needed moving from lying on your back to sitting on the side of a flat bed without using bedrails?: A Little Help needed moving to and from a bed to a chair (including a wheelchair)?: A Little Help needed standing up from a chair using your arms (e.g., wheelchair or bedside chair)?: A Lot Help needed to walk in hospital room?: A Little Help needed climbing 3-5 steps with a railing? : A Little 6 Click Score: 18    End of Session Equipment Utilized During Treatment: Gait belt Activity Tolerance: Patient tolerated treatment well;Patient limited by fatigue Patient left: in chair;with  call bell/phone within reach Nurse Communication: Mobility status(O2 sats remained at acceptable levels on room air) PT Visit Diagnosis: Unsteadiness on feet (R26.81);Other abnormalities of gait and mobility (R26.89);Muscle weakness (generalized) (M62.81)     Time: 8675-4492 PT Time Calculation (min) (ACUTE ONLY): 20 min  Charges:  $Gait Training: 8-22 mins                     Roney Marion, [place]  Acute Rehabilitation Services Pager [phone] Office [place] 09/21/2019, 6:13 PM

## 2019-09-21 NOTE — Progress Notes (Signed)
NAME:  Dennis Macias, MRN:  [id number], DOB:  1951-07-06, LOS: 4 ADMISSION DATE:  09/17/2019  Subjective  No overnight events. Asking about going home this morning. Discussed he will likely be able to following thoracentesis.  Objective   Blood pressure (!) 157/79, pulse 67, temperature 98.7 F (37.1 C), temperature source Oral, resp. rate 16, height 6' (1.829 m), weight 78 kg, SpO2 94 %.     Intake/Output Summary (Last 24 hours) at 09/21/2019 1102 Last data filed at 09/21/2019 0700 Gross per 24 hour  Intake 560 ml  Output 1300 ml  Net -740 ml   Filed Weights   09/17/19 0553 09/17/19 1708  Weight: 80 kg 78 kg    Examination: GENERAL: in no acute distress CARDIAC: heart RRR. No peripheral edema.  PULMONARY: acyanotic. Diminished breath sounds at the bilateral bases. ABDOMEN: bowel sounds active SKIN: no rash or lesions on limited exam   Significant Diagnostic Tests:  3/11 CT AP> moderate bilateral effusions with overlying atelectasis. Wall thickening of the distal and terminal ilium and adjacent colon suggesting inflammatory/infectious process. Air in bladder. Anasarca  Prior Pertinent Diagnostic Tests:  2019 bone marrow bx>The bone marrow is generally normocellular for age with trilineage hematopoiesis. There is subtle dysgranulopoiesis present with no increase in blastic cells. Significant dyspoiesis is not seen in the erythroid or megakaryocytic cell lines. The myeloid changes are limited and not considered specific or diagnostic of a myelodysplastic process and may represent a secondary change. The peripheral blood shows abnormal red cell morphology with increased number of acanthocytes. This is typically seen in patients with liver disease, hypothyroidism, malnutrition, abetalipoproteinemia, etc.  2019 Bone marrow Cytogenetics> normal  07/14/19 renal US>Focal echogenic lesion in the inferior aspect of the left kidney. 08/05/19 MRI a/p> motion artifact. No renal lesion noted. Iron  depositions in the spleen and liver appreciated.  09/2015 TEE> mild LVH. Normal systolic and diastolic function. No valvular abnormalities.  Labs    CBC Latest Ref Rng & Units 09/21/2019 09/20/2019 09/19/2019  WBC 4.0 - 10.5 K/uL 6.3 6.5 5.4  Hemoglobin 13.0 - 17.0 g/dL 7.8(L) 7.3(L) 7.1(L)  Hematocrit 39.0 - 52.0 % 23.6(L) 22.4(L) 21.7(L)  Platelets 150 - 400 K/uL 301 298 310   BMP Latest Ref Rng & Units 09/21/2019 09/20/2019 09/19/2019  Glucose 70 - 99 mg/dL 90 106(H) 111(H)  BUN 8 - 23 mg/dL 33(H) 34(H) 34(H)  Creatinine 0.61 - 1.24 mg/dL 3.54(H) 3.67(H) 3.73(H)  BUN/Creat Ratio 10 - 24 - - -  Sodium 135 - 145 mmol/L 141 141 140  Potassium 3.5 - 5.1 mmol/L 4.2 4.3 4.4  Chloride 98 - 111 mmol/L 107 108 107  CO2 22 - 32 mmol/L 24 23 23   Calcium 8.9 - 10.3 mg/dL 8.8(L) 8.5(L) 8.8(L)    Summary  Dennis Macias is a 69 yo gentleman with rapidly progressing CKD with nephrotic syndrome, bladder outlet obstruction, 44mo hx of unintentional weight less, hematuria, normocytic normochromic anemia, HTN, T2DM who was admitted to IMTS on 3/11 for 1d history of lower abdominal discomfort and diaphoresis found to have bowel thickening and air in his bladder wall along with progressively worse renal function.  Assessment & Plan:  Principal Problem:   Anasarca associated with disorder of kidney Active Problems:   BPH with urinary obstruction   Weight loss, non-intentional   CKD (chronic kidney disease) stage 4, GFR 15-29 ml/min (HCC)  CKD IV with nephrotic syndrome. Thought to be attributable to HTN/DM. Follows with Dr. Hollie Salk outpatient.  -no significant change  in renal function since admission. -Serum kappa/lamda radio 1.59--boarderline elevated. Urine ratio pending -PTH elevated Urinary retention. Follows with urology outpatient for this. Plan Continue foley catheter. Will need to continue at discharge and follow up with urology. Will continue to follow renal labs.  Bilateral pleural effusions.  Anasarca.  Suspect to be due to attributable to low albumin. Plan: Thoracentesis today with likely discharge there after.  Bowel wall thickening. Suspect to be from low protein levels.  Iron depositions in the liver and spleen. seen on 07/2019 MRI. GI consulted--no apparent concerns about this. No lab abnormalities to raise concern for hemachromatosis. GI signed off.  Best practice:  CODE STATUS: Full Diet: renal DVT for prophylaxis: heparin Dispo: likely discharge later today following thoracentesis. Consider outpatient echo. Will need TSH/T4 rechecked 4-6w after discharge.   [name], MD INTERNAL MEDICINE RESIDENT PGY-1 PAGER #: [phone] 09/21/19  11:02 AM

## 2019-09-21 NOTE — TOC Progression Note (Signed)
Transition of Care [facility]) - Progression Note    Patient Details  Name: Dennis Macias MRN: [id number] Date of Birth: 06-27-1951  Transition of [facility]) CM/SW Contact  Jacalyn Lefevre Edson Snowball, RN Phone Number: 09/21/2019, 4:32 PM  Clinical Narrative:     Patient in procedure. Called wife [name]. Patient has wheel chair, walker and quad cane.   Patient receiving HHPT with [facility]. Called Tommi Rumps with [name] and confirmed messaged MD for orders for HHPT   Expected Discharge Plan: [place] Barriers to Discharge: Continued Medical Work up  Expected Discharge Plan and Services Expected Discharge Plan: [place]   Discharge Planning Services: CM Consult Post Acute Care Choice: [place] arrangements for the past 2 months: Single Family Home                 DME Arranged: N/A DME Agency: NA       HH Arranged: PT HH Agency: [facility] Date [facility] Agency Contacted: 09/21/19 Time [place]: [id number] Representative spoke with at [place]: Tommi Rumps will need orders   Social Determinants of Health (SDOH) Interventions    Readmission Risk Interventions Readmission Risk Prevention Plan 08/10/2019  Transportation Screening Complete  PCP or Specialist Appt within 3-5 Days Complete  HRI or [place] Complete  Social Work Consult for [place] Planning/Counseling Complete  Palliative Care Screening Complete  Medication Review Press photographer) Complete  Some recent data might be hidden

## 2019-09-21 NOTE — Evaluation (Signed)
Occupational Therapy Evaluation Patient Details Name: Dennis Macias MRN: [id number] DOB: 05-26-1951 Today's Date: 09/21/2019    History of Present Illness 69yo male w/rapidly progressive CKD IV, nephrotic syndrome, BOO, unintentional weight loss, normocytic anemia, HTN, TIIDM presenting with lower abdominal discomfort and diaphoresis admitted for bowel wall thickening and progression of his renal disease    Clinical Impression   Pt with decline in function and safety with ADLs and ADL mobility with impaired strength, balance and endurance. Pt lives at home with fiance and his children and grand children nearby. Pt reports that he was independent with ADLs/selfcare and uses a cane (mostly) and RW for mobility. Pt currently mod I with bed mobility to sit EOB, min  Guard A sitting EOB for UB ADLs, mod A with LB ADLs, min A with grooming tasks standing at toilet, min A with toileting tasks and min A with mobility using quad cane. Pt would benefit from acute OT services to address impairments to maximize level of function and safety    Follow Up Recommendations  Home health OT    Equipment Recommendations  Tub/shower bench    Recommendations for Other Services       Precautions / Restrictions Precautions Precautions: Fall Restrictions Weight Bearing Restrictions: No      Mobility Bed Mobility Overal bed mobility: Modified Independent             General bed mobility comments: Uses bedrails for supine to sit  Transfers Overall transfer level: Needs assistance   Transfers: Sit to/from Stand Sit to Stand: Min assist              Balance Overall balance assessment: Needs assistance Sitting-balance support: No upper extremity supported;Feet supported Sitting balance-Leahy Scale: Good     Standing balance support: Bilateral upper extremity supported;Single extremity supported;During functional activity Standing balance-Leahy Scale: Poor                              ADL either performed or assessed with clinical judgement   ADL Overall ADL's : Needs assistance/impaired Eating/Feeding: Set up;Independent;Sitting   Grooming: Wash/dry hands;Wash/dry face;Minimal assistance;Standing   Upper Body Bathing: Min guard;Sitting   Lower Body Bathing: Moderate assistance   Upper Body Dressing : Min guard;Sitting   Lower Body Dressing: Moderate assistance   Toilet Transfer: Minimal assistance;Ambulation;Cueing for safety(quad cane)   Toileting- Clothing Manipulation and Hygiene: Minimal assistance;Sit to/from stand       Functional mobility during ADLs: Minimal assistance;Cueing for safety       Vision Patient Visual Report: No change from baseline       Perception     Praxis      Pertinent Vitals/Pain Pain Assessment: No/denies pain     Hand Dominance Right   Extremity/Trunk Assessment Upper Extremity Assessment Upper Extremity Assessment: Generalized weakness   Lower Extremity Assessment Lower Extremity Assessment: Defer to PT evaluation   Cervical / Trunk Assessment Cervical / Trunk Assessment: Normal   Communication Communication Communication: HOH   Cognition Arousal/Alertness: Awake/alert Behavior During Therapy: WFL for tasks assessed/performed Overall Cognitive Status: Within Functional Limits for tasks assessed                                     General Comments       Exercises     Shoulder Instructions      Home Living Family/patient  expects to be discharged to:: Private residence Living Arrangements: Spouse/significant other Available Help at Discharge: Family Type of Home: Apartment Home Access: Stairs to enter Entrance Stairs-Number of Steps: [place]: One level;Able to live on main level with bedroom/bathroom     Bathroom Shower/Tub: Tub/shower unit   Bathroom Toilet: Handicapped height     Home Equipment: Bedside commode;Wheelchair - manual;Cane - Set designer - 2  wheels   Additional Comments: fiance works, his children live near by and can come help him PRN      Prior Functioning/Environment Level of Independence: Needs assistance  Gait / Transfers Assistance Needed: Recently has had difficulty rising from toilet, bed, low surfaces. Typically walks with quad cane or RW ADL's / Homemaking Assistance Needed: reports that he was independent with ADLs/selfcare            OT Problem List: Decreased strength;Impaired balance (sitting and/or standing);Decreased activity tolerance;Decreased knowledge of use of DME or AE      OT Treatment/Interventions: Self-care/ADL training;DME and/or AE instruction;Therapeutic activities;Balance training;Therapeutic exercise;Patient/family education    OT Goals(Current goals can be found in the care plan section) Acute Rehab OT Goals Patient Stated Goal: go home OT Goal Formulation: With patient Time For Goal Achievement: 10/05/19 Potential to Achieve Goals: Good ADL Goals Pt Will Perform Grooming: with min guard assist;with supervision;with set-up;standing Pt Will Perform Upper Body Bathing: with supervision;with set-up;sitting Pt Will Perform Lower Body Bathing: with min assist;sitting/lateral leans;sit to/from stand Pt Will Perform Upper Body Dressing: with supervision;with set-up;sitting Pt Will Perform Lower Body Dressing: with min assist;sitting/lateral leans;sit to/from stand Pt Will Transfer to Toilet: with min guard assist;ambulating Pt Will Perform Toileting - Clothing Manipulation and hygiene: with min guard assist;sit to/from stand Pt Will Perform Tub/Shower Transfer: with min assist;with min guard assist;ambulating;tub bench;3 in 1  OT Frequency: Min 2X/week   Barriers to D/C:            Co-evaluation              AM-PAC OT "6 Clicks" Daily Activity     Outcome Measure Help from another person eating meals?: None Help from another person taking care of personal grooming?: A  Little Help from another person toileting, which includes using toliet, bedpan, or urinal?: A Little Help from another person bathing (including washing, rinsing, drying)?: A Lot Help from another person to put on and taking off regular upper body clothing?: A Little Help from another person to put on and taking off regular lower body clothing?: A Lot 6 Click Score: 17   End of Session Equipment Utilized During Treatment: Gait belt;Other (comment)(quad cane)  Activity Tolerance: Patient tolerated treatment well Patient left: in chair;with call bell/phone within reach  OT Visit Diagnosis: Unsteadiness on feet (R26.81);Other abnormalities of gait and mobility (R26.89);Muscle weakness (generalized) (M62.81)                Time: 0947-0962 OT Time Calculation (min): 23 min Charges:  OT General Charges $OT Visit: 1 Visit OT Evaluation $OT Eval Moderate Complexity: 1 Mod    Britt Bottom 09/21/2019, 1:36 PM

## 2019-09-21 NOTE — Discharge Summary (Addendum)
Name: Dennis Macias MRN: [id number] DOB: 1950/09/30 69 y.o. PCP: [name], MD  Date of Admission: 09/17/2019  5:49 AM Date of Discharge: 09/21/2019 Attending Physician: [place]  Discharge Diagnosis: 1. CKD IV 2. Urinary retention 3. Pleural effusions 4. Bowel wall thickening 5. Poor appetite. 6. Elevated TSH/T4   Discharge Medications: Allergies as of 09/21/2019   No Known Allergies      Medication List     STOP taking these medications    Lokelma 10 g Pack packet Generic drug: sodium zirconium cyclosilicate       TAKE these medications    Accu-Chek FastClix Lancets Misc Check blood sugar up to 7 times a week as instructed   Accu-Chek Guide w/Device Kit 1 each by Does not apply route daily. Check blood sugar as instructed up to 7 times a week   amLODipine 10 MG tablet Commonly known as: NORVASC Take 1 tablet (10 mg total) by mouth daily.   aspirin 81 MG tablet Take 1 tablet (81 mg total) by mouth daily. Internal Medicine Program place on hold until patient requests to fill   atorvastatin 80 MG tablet Commonly known as: LIPITOR Take 1 tablet (80 mg total) by mouth at bedtime. IM program   Darbepoetin Alfa 150 MCG/0.3ML Sosy injection Commonly known as: ARANESP Inject 0.3 mLs (150 mcg total) into the skin every Friday at 6 PM. Start taking on: September 25, 2019   dorzolamide-timolol 22.3-6.8 MG/ML ophthalmic solution Commonly known as: COSOPT Place 1 drop into the right eye 2 (two) times daily.   ferrous gluconate 324 MG tablet Commonly known as: FERGON Take 324 mg by mouth daily with breakfast.   finasteride 5 MG tablet Commonly known as: PROSCAR Take 5 mg by mouth daily.   furosemide 40 MG tablet Commonly known as: LASIX TAKE 1 TABLET (40 MG TOTAL) BY MOUTH 2 (TWO) TIMES DAILY.   glucose blood test strip Commonly known as: Accu-Chek Guide Check blood sugar up to 7 times a week as instructed   metoprolol tartrate 25 MG tablet Commonly  known as: LOPRESSOR Take 0.5 tablets (12.5 mg total) by mouth 2 (two) times daily.   sodium bicarbonate 650 MG tablet Take 650 mg by mouth 2 (two) times daily.   tamsulosin 0.4 MG Caps capsule Commonly known as: FLOMAX TAKE 1 CAPSULE (0.4 MG TOTAL) BY MOUTH DAILY AFTER SUPPER.   vitamin B-12 1000 MCG tablet Commonly known as: CYANOCOBALAMIN Take 1,000 mcg by mouth daily.        Disposition and follow-up:   [name] was discharged from [facility] in Stable condition.  At the hospital follow up visit please address:  1. CKD IV-follows with nephrology. Repeat MRI for evaluation of renal lesion  2. Urinary retention-follows with urology. Discharged with foley catheter. Appt scheduled with urology 3/24  3. Pleural effusions. Underwent R thoracentesis 3/15--844m removed. Follow up cytology/path report. Obtain chest CT for evaluation of loculated effusion.  4. TSH obtained and found to be elevated. T4 elevated as well. Repeat at follow up.  5. Follow up SPEP/UPEP, 5HIAA  Follow-up Appointments: Follow-up Information     Cristan Hout, MD Follow up in 1 week(s).   Specialty: Internal Medicine Contact information: [phone]        [facility]   Specialty: [facility] Contact information: [address] [phone]  Hospital Course: Dennis Macias is a 69 yo gentleman with CKD IV>nephrotic syndrome, urinary retention, anorexia and overall decline in functional status over the past year.  He presented to [facility] on 09/21/19 for evaluation of abdominal discomfort and an episode of night sweats.  ED evaluation revealed worsening renal function and bowel wall thickening on abdominal CT. He had recently had his chronic foley catheter removed a week prior to admission and post void scan revealed post void volume of ~427m. He  was also noted to have accessory muscle use with respirations and a CXR revealed bilateral effusions.  Nephrology was consulted and agreed that progressive renal function was most likely attributable to urinary retention. Renal function slightly improved after foley placement and catheter is to remain in place until he sees urology. On admission, he had noted that the abdominal discomfort was a fluttering feeling and I suspect that this was a bladder spasm as it resolved after foley placement. SPEP/UPEP and 5-HIAA were also obtained and will need to be followed up at hosp follow up.  GI was also consulted for his poor appetite, bowel wall thickening and liver/spleen iron depositions. Their impression was a low concern of the iron depositions and bowel wall thickening likely due to low serum protein. No specific recommendations from GI.  On day of discharge, he underwent an uncomplicated right thoracentesis for his pleural effusion>8083mremoved. Fluid analysis to be followed up at post hospital.  TSH was also obtained during his hospitalization and found to be elevated at 8. T4 was mildly elevated as well which is interesting. Will need a repeat of these at hospital follow up.   Discharge Vitals:   BP 129/64 (BP Location: Right Arm)   Pulse 67   Temp 99.1 F (37.3 C) (Oral)   Resp 17   Ht 6' (1.829 m)   Wt 78 kg   SpO2 98%   BMI 23.32 kg/m   Pertinent Labs, Studies, and Procedures:  3/11 CT AP> moderate bilateral effusions with overlying atelectasis. Wall thickening of the distal and terminal ilium and adjacent colon suggesting inflammatory/infectious process. Air in bladder. Anasarca  3/15 CXR>Persistent small left partially loculated pleural effusion and dense basilar consolidation.    Signed: Mitzi HansenMD 09/22/2019, 7:23 AM   Pager: [phone]

## 2019-09-21 NOTE — Procedures (Signed)
PROCEDURE SUMMARY:  Successful US guided right thoracentesis. Yielded 800 mL of yellow fluid. Pt tolerated procedure well. No immediate complications.  Specimen was sent for labs. CXR ordered.  EBL < 5 mL  Docia Barrier PA-C 09/21/2019 4:10 PM

## 2019-09-21 NOTE — Progress Notes (Signed)
Pt alert and oriented x4. Pt and his family educated on discharge instructions. Standard urinary drainage bag from foley replaced with leg bag. Pt and family educated on foley care. Pt helped with get dressing, and assisted to main entrance via wheelchair where family picked him up. Vitals WDL. No acute distress noted.

## 2019-09-22 LAB — UPEP/UIFE/LIGHT CHAINS/TP, 24-HR UR
% BETA, Urine: 14.5 %
ALPHA 1 URINE: 7.7 %
Albumin, U: 55.9 %
Alpha 2, Urine: 6.6 %
Free Kappa Lt Chains,Ur: 144.42 mg/L — ABNORMAL HIGH (ref 0.63–113.79)
Free Kappa/Lambda Ratio: 4.5 (ref 1.03–31.76)
Free Lambda Lt Chains,Ur: 32.11 mg/L — ABNORMAL HIGH (ref 0.47–11.77)
GAMMA GLOBULIN URINE: 15.3 %
Total Protein, Urine-Ur/day: 2202 mg/24 hr — ABNORMAL HIGH (ref 30–150)
Total Protein, Urine: 220.2 mg/dL
Total Volume: 1000

## 2019-09-22 LAB — MULTIPLE MYELOMA PANEL, SERUM
Albumin SerPl Elph-Mcnc: 2.6 g/dL — ABNORMAL LOW (ref 2.9–4.4)
Albumin/Glob SerPl: 1.1 (ref 0.7–1.7)
Alpha 1: 0.3 g/dL (ref 0.0–0.4)
Alpha2 Glob SerPl Elph-Mcnc: 0.6 g/dL (ref 0.4–1.0)
B-Globulin SerPl Elph-Mcnc: 0.8 g/dL (ref 0.7–1.3)
Gamma Glob SerPl Elph-Mcnc: 0.9 g/dL (ref 0.4–1.8)
Globulin, Total: 2.6 g/dL (ref 2.2–3.9)
IgA: 323 mg/dL (ref 61–437)
IgG (Immunoglobin G), Serum: 1011 mg/dL (ref 603–1613)
IgM (Immunoglobulin M), Srm: 73 mg/dL (ref 20–172)
Total Protein ELP: 5.2 g/dL — ABNORMAL LOW (ref 6.0–8.5)

## 2019-09-22 LAB — PH, BODY FLUID: pH, Body Fluid: 7.7

## 2019-09-22 LAB — PATHOLOGIST SMEAR REVIEW

## 2019-09-23 DIAGNOSIS — I120 Hypertensive chronic kidney disease with stage 5 chronic kidney disease or end stage renal disease: Secondary | ICD-10-CM | POA: Diagnosis not present

## 2019-09-23 DIAGNOSIS — E11319 Type 2 diabetes mellitus with unspecified diabetic retinopathy without macular edema: Secondary | ICD-10-CM | POA: Diagnosis not present

## 2019-09-23 DIAGNOSIS — E1122 Type 2 diabetes mellitus with diabetic chronic kidney disease: Secondary | ICD-10-CM | POA: Diagnosis not present

## 2019-09-23 DIAGNOSIS — M47816 Spondylosis without myelopathy or radiculopathy, lumbar region: Secondary | ICD-10-CM | POA: Diagnosis not present

## 2019-09-23 DIAGNOSIS — N200 Calculus of kidney: Secondary | ICD-10-CM | POA: Diagnosis not present

## 2019-09-23 DIAGNOSIS — Z466 Encounter for fitting and adjustment of urinary device: Secondary | ICD-10-CM | POA: Diagnosis not present

## 2019-09-23 DIAGNOSIS — M47812 Spondylosis without myelopathy or radiculopathy, cervical region: Secondary | ICD-10-CM | POA: Diagnosis not present

## 2019-09-23 DIAGNOSIS — D631 Anemia in chronic kidney disease: Secondary | ICD-10-CM | POA: Diagnosis not present

## 2019-09-23 DIAGNOSIS — N186 End stage renal disease: Secondary | ICD-10-CM | POA: Diagnosis not present

## 2019-09-24 ENCOUNTER — Telehealth: Payer: Self-pay | Admitting: Internal Medicine

## 2019-09-24 ENCOUNTER — Encounter: Payer: Self-pay | Admitting: Internal Medicine

## 2019-09-24 DIAGNOSIS — D631 Anemia in chronic kidney disease: Secondary | ICD-10-CM | POA: Diagnosis not present

## 2019-09-24 DIAGNOSIS — M47816 Spondylosis without myelopathy or radiculopathy, lumbar region: Secondary | ICD-10-CM | POA: Diagnosis not present

## 2019-09-24 DIAGNOSIS — I120 Hypertensive chronic kidney disease with stage 5 chronic kidney disease or end stage renal disease: Secondary | ICD-10-CM | POA: Diagnosis not present

## 2019-09-24 DIAGNOSIS — N186 End stage renal disease: Secondary | ICD-10-CM | POA: Diagnosis not present

## 2019-09-24 DIAGNOSIS — Z466 Encounter for fitting and adjustment of urinary device: Secondary | ICD-10-CM | POA: Diagnosis not present

## 2019-09-24 DIAGNOSIS — M47812 Spondylosis without myelopathy or radiculopathy, cervical region: Secondary | ICD-10-CM | POA: Diagnosis not present

## 2019-09-24 DIAGNOSIS — N200 Calculus of kidney: Secondary | ICD-10-CM | POA: Diagnosis not present

## 2019-09-24 DIAGNOSIS — E11319 Type 2 diabetes mellitus with unspecified diabetic retinopathy without macular edema: Secondary | ICD-10-CM | POA: Diagnosis not present

## 2019-09-24 DIAGNOSIS — E1122 Type 2 diabetes mellitus with diabetic chronic kidney disease: Secondary | ICD-10-CM | POA: Diagnosis not present

## 2019-09-24 NOTE — Telephone Encounter (Signed)
Clair Gulling at [facility] notified of Dr. Chase Picket response.  He verbalized understanding and asked if RN could fax a copy of MD's response, since order states to give medication at 6pm.  Response faxed to [facility] at [phone], and confirmation received. SChaplin, RN,BSN

## 2019-09-24 NOTE — Telephone Encounter (Signed)
Yes, they can do the injection earlier on Friday afternoon. It can be done any time during the day, I would just say to try to do it consistently at the same time.

## 2019-09-24 NOTE — Telephone Encounter (Signed)
Pt contact [facility] for VO [phone]

## 2019-09-24 NOTE — Telephone Encounter (Signed)
RTC to [place] at [facility], needs VO for skilled nursing to go out once weekly for the injection of Darbepoetin which was ordered to start on 09/25/19 @ 6pm.  VO given.  Also, wants to know if this needs to be done at [facility], or if it can be done earlier on Friday afternoon?  RN asked [facility] nurse if they had an o/c nurse who could administer injection at 6pm, he states he would have to check with his Freight forwarder.    Dr. Darrick Meigs, are you ok with them doing the injection earlier on Friday afternoon and if so, what time? Thank you, SChaplin, RN,BSN

## 2019-09-25 DIAGNOSIS — I120 Hypertensive chronic kidney disease with stage 5 chronic kidney disease or end stage renal disease: Secondary | ICD-10-CM | POA: Diagnosis not present

## 2019-09-25 DIAGNOSIS — M47816 Spondylosis without myelopathy or radiculopathy, lumbar region: Secondary | ICD-10-CM | POA: Diagnosis not present

## 2019-09-25 DIAGNOSIS — N186 End stage renal disease: Secondary | ICD-10-CM | POA: Diagnosis not present

## 2019-09-25 DIAGNOSIS — D631 Anemia in chronic kidney disease: Secondary | ICD-10-CM | POA: Diagnosis not present

## 2019-09-25 DIAGNOSIS — E1122 Type 2 diabetes mellitus with diabetic chronic kidney disease: Secondary | ICD-10-CM | POA: Diagnosis not present

## 2019-09-25 DIAGNOSIS — Z466 Encounter for fitting and adjustment of urinary device: Secondary | ICD-10-CM | POA: Diagnosis not present

## 2019-09-25 DIAGNOSIS — M47812 Spondylosis without myelopathy or radiculopathy, cervical region: Secondary | ICD-10-CM | POA: Diagnosis not present

## 2019-09-25 DIAGNOSIS — N200 Calculus of kidney: Secondary | ICD-10-CM | POA: Diagnosis not present

## 2019-09-25 DIAGNOSIS — E11319 Type 2 diabetes mellitus with unspecified diabetic retinopathy without macular edema: Secondary | ICD-10-CM | POA: Diagnosis not present

## 2019-09-26 LAB — CULTURE, BODY FLUID W GRAM STAIN -BOTTLE: Culture: NO GROWTH

## 2019-09-26 LAB — 5 HIAA, QUANTITATIVE, URINE, 24 HOUR
5-HIAA, Ur: 2.9 mg/L
5-HIAA,Quant.,24 Hr Urine: 2.9 mg/24 hr (ref 0.0–14.9)
Total Volume: 1000

## 2019-09-29 ENCOUNTER — Ambulatory Visit: Payer: Medicare PPO | Admitting: Internal Medicine

## 2019-09-29 ENCOUNTER — Encounter: Payer: Self-pay | Admitting: Internal Medicine

## 2019-09-29 VITALS — BP 143/70 | HR 73 | Temp 98.2°F | Ht 72.0 in | Wt 171.8 lb

## 2019-09-29 DIAGNOSIS — R7989 Other specified abnormal findings of blood chemistry: Secondary | ICD-10-CM | POA: Insufficient documentation

## 2019-09-29 DIAGNOSIS — N184 Chronic kidney disease, stage 4 (severe): Secondary | ICD-10-CM | POA: Diagnosis not present

## 2019-09-29 MED FILL — METOPROLOL TARTRATE 25 MG T: 25 | 30 days supply | Qty: 30 | Fill #2

## 2019-09-29 NOTE — Assessment & Plan Note (Addendum)
CKD: Patient has CKD stage IV.  Has appointment with his nephrologist, Dr. Hollie Salk this week.  Reports recent MRI of renal lesion since discharge from hospital, not available to be seen in our system. No evidence of  monoclonal gammopathy on workup in hospital.   Plan:  Follow recommendations from nephrology

## 2019-09-29 NOTE — Patient Instructions (Signed)
Thank you for trusting me with your care. To recap, today we discussed the following:   Your thyroid stimulating hormone and thyroxine ( test for thyroid function) were slightly elevated on recent check.  We will wait until you run out of your acute stress and illness to recheck these labs.  Dr. Darrick Meigs asked me to follow-up on labs ordered in the hospital and your lab work was unremarkable. I will have you follow up with Dr.[name] after your other appointments scheduled with specialist.   My best,  Tamsen Snider, MD

## 2019-09-29 NOTE — Assessment & Plan Note (Signed)
Patient is TSH 7.758, T4 1.25. Both mild elevations in setting of recent illness. Will have PCP recheck in 4-6 weeks at next visit.

## 2019-09-29 NOTE — Progress Notes (Signed)
   CC: Hospital follow up  HPI:[name] is a 69 y.o. male who presents for hospital follow up. Reviewed hospital discharge. Patient reports already had MRI scheduled by nephrology, and has plan to follow up with urology this week. Patient pleural effusion analysis likely represents a transudative effusion in setting of nephrotic range proteinuria. Will put off rechecking TSH,T4 today. Lastly SPEP/UPEP did not show monoclonal gammopathy and 5HIAA was within normal limits. Results shared with patient. Please see individual problem based A/P for additional details.    Past Medical History:  Diagnosis Date  . Anemia   . Arthritis    past hx   . Blindness    right eye  . Cataract    removed both eyes  . Dehydration   . Diabetes ([place])   . Glaucoma   . History of CVA (cerebrovascular accident) 09/13/2015  . History of urinary retention   . Hyperlipidemia   . Hypertension   . Pernicious anemia 02/24/2018  . Stroke [facility])    2017- March  . Tachycardia 08/26/2017  . Tubular adenoma of colon 02/2017  . Weight loss, non-intentional 08/26/2017   10 lbs between 6/18 & 2/19   Review of Systems: ROS negative except as per HPI.  Physical Exam: Vitals:   09/29/19 1420  BP: (!) 143/70  Pulse: 73  Temp: 98.2 F (36.8 C)  TempSrc: Oral  SpO2: 100%  Weight: 171 lb 12.8 oz (77.9 kg)  Height: 6' (1.829 m)    General: NAD, nl appearance, difficulty hearing  HE: Normocephalic, atraumatic , EOMI, Conjunctivae normal ENT: No congestion, no rhinorrhea, no exudate or erythema  Cardiovascular: Normal rate, regular rhythm.  No murmurs, rubs, or gallops Pulmonary : Effort normal, breath sounds diminished bilaterally at bases. No wheezes, rales, or rhonchi Abdominal: soft, nontender,  bowel sounds present Musculoskeletal: no swelling , deformity, injury ,or tenderness in extremities, Skin: Warm, dry , no bruising, erythema, or rash Psychiatric/Behavioral:  normal mood, normal behavior      Assessment & Plan:   See Encounters Tab for problem based charting.  Patient discussed with Dr. Lynnae January

## 2019-09-30 DIAGNOSIS — Z8673 Personal history of transient ischemic attack (TIA), and cerebral infarction without residual deficits: Secondary | ICD-10-CM | POA: Diagnosis not present

## 2019-09-30 DIAGNOSIS — E872 Acidosis: Secondary | ICD-10-CM | POA: Diagnosis not present

## 2019-09-30 DIAGNOSIS — N184 Chronic kidney disease, stage 4 (severe): Secondary | ICD-10-CM | POA: Diagnosis not present

## 2019-09-30 DIAGNOSIS — D631 Anemia in chronic kidney disease: Secondary | ICD-10-CM | POA: Diagnosis not present

## 2019-09-30 DIAGNOSIS — N2889 Other specified disorders of kidney and ureter: Secondary | ICD-10-CM | POA: Diagnosis not present

## 2019-09-30 DIAGNOSIS — I129 Hypertensive chronic kidney disease with stage 1 through stage 4 chronic kidney disease, or unspecified chronic kidney disease: Secondary | ICD-10-CM | POA: Diagnosis not present

## 2019-09-30 DIAGNOSIS — N189 Chronic kidney disease, unspecified: Secondary | ICD-10-CM | POA: Diagnosis not present

## 2019-09-30 NOTE — Progress Notes (Signed)
Internal Medicine Clinic Attending ° °Case discussed with Dr. Steen  at the time of the visit.  We reviewed the resident’s history and exam and pertinent patient test results.  I agree with the assessment, diagnosis, and plan of care documented in the resident’s note.  °

## 2019-10-01 DIAGNOSIS — E11319 Type 2 diabetes mellitus with unspecified diabetic retinopathy without macular edema: Secondary | ICD-10-CM | POA: Diagnosis not present

## 2019-10-01 DIAGNOSIS — N186 End stage renal disease: Secondary | ICD-10-CM | POA: Diagnosis not present

## 2019-10-01 DIAGNOSIS — M47816 Spondylosis without myelopathy or radiculopathy, lumbar region: Secondary | ICD-10-CM | POA: Diagnosis not present

## 2019-10-01 DIAGNOSIS — N401 Enlarged prostate with lower urinary tract symptoms: Secondary | ICD-10-CM | POA: Diagnosis not present

## 2019-10-01 DIAGNOSIS — M47812 Spondylosis without myelopathy or radiculopathy, cervical region: Secondary | ICD-10-CM | POA: Diagnosis not present

## 2019-10-01 DIAGNOSIS — R338 Other retention of urine: Secondary | ICD-10-CM | POA: Diagnosis not present

## 2019-10-01 DIAGNOSIS — Z466 Encounter for fitting and adjustment of urinary device: Secondary | ICD-10-CM | POA: Diagnosis not present

## 2019-10-01 DIAGNOSIS — N200 Calculus of kidney: Secondary | ICD-10-CM | POA: Diagnosis not present

## 2019-10-01 DIAGNOSIS — D631 Anemia in chronic kidney disease: Secondary | ICD-10-CM | POA: Diagnosis not present

## 2019-10-01 DIAGNOSIS — E1122 Type 2 diabetes mellitus with diabetic chronic kidney disease: Secondary | ICD-10-CM | POA: Diagnosis not present

## 2019-10-01 DIAGNOSIS — I120 Hypertensive chronic kidney disease with stage 5 chronic kidney disease or end stage renal disease: Secondary | ICD-10-CM | POA: Diagnosis not present

## 2019-10-02 DIAGNOSIS — E1122 Type 2 diabetes mellitus with diabetic chronic kidney disease: Secondary | ICD-10-CM | POA: Diagnosis not present

## 2019-10-02 DIAGNOSIS — E11319 Type 2 diabetes mellitus with unspecified diabetic retinopathy without macular edema: Secondary | ICD-10-CM | POA: Diagnosis not present

## 2019-10-02 DIAGNOSIS — I120 Hypertensive chronic kidney disease with stage 5 chronic kidney disease or end stage renal disease: Secondary | ICD-10-CM | POA: Diagnosis not present

## 2019-10-02 DIAGNOSIS — D631 Anemia in chronic kidney disease: Secondary | ICD-10-CM | POA: Diagnosis not present

## 2019-10-02 DIAGNOSIS — Z466 Encounter for fitting and adjustment of urinary device: Secondary | ICD-10-CM | POA: Diagnosis not present

## 2019-10-02 DIAGNOSIS — N186 End stage renal disease: Secondary | ICD-10-CM | POA: Diagnosis not present

## 2019-10-02 DIAGNOSIS — M47816 Spondylosis without myelopathy or radiculopathy, lumbar region: Secondary | ICD-10-CM | POA: Diagnosis not present

## 2019-10-02 DIAGNOSIS — N200 Calculus of kidney: Secondary | ICD-10-CM | POA: Diagnosis not present

## 2019-10-02 DIAGNOSIS — M47812 Spondylosis without myelopathy or radiculopathy, cervical region: Secondary | ICD-10-CM | POA: Diagnosis not present

## 2019-10-05 ENCOUNTER — Other Ambulatory Visit: Payer: Self-pay | Admitting: Urology

## 2019-10-07 NOTE — Progress Notes (Signed)
DUE TO COVID-19 ONLY ONE VISITOR IS ALLOWED TO COME WITH YOU AND STAY IN THE WAITING ROOM ONLY DURING PRE OP AND PROCEDURE DAY OF SURGERY. THE 1 VISITOR MAY VISIT WITH YOU AFTER SURGERY IN YOUR PRIVATE ROOM DURING VISITING HOURS ONLY!  YOU NEED TO HAVE A COVID 19 TEST ON_______ @_______ , THIS TEST MUST BE DONE BEFORE SURGERY, COME  [address] , [id number].  ([place]) ONCE YOUR COVID TEST IS COMPLETED, PLEASE BEGIN THE QUARANTINE INSTRUCTIONS AS OUTLINED IN YOUR HANDOUT.                Macdonald Rigor  10/07/2019   Your procedure is scheduled on:                10/20/2019   Report to [facility] Main  Entrance   Report to admitting at    [place] AM     Call this number if you have problems the morning of surgery [phone]    Remember: Do not eat food or drink liquids :After Midnight. BRUSH YOUR TEETH MORNING OF SURGERY AND RINSE YOUR MOUTH OUT, NO CHEWING GUM CANDY OR MINTS.     Take these medicines the morning of surgery with A SIP OF WATER:  ( Amlodipine, Eye drops as usual , Proscar, Sodium Bicarbonate  DO NOT TAKE ANY DIABETIC MEDICATIONS DAY OF YOUR SURGERY                               You may not have any metal on your body including hair pins and              piercings  Do not wear jewelry, , lotions, powders or perfumes, deodorant              Do not shave  48 hours prior to surgery.              Men may shave face and neck.   Do not bring valuables to the hospital. [place].  Contacts, dentures or bridgework may not be worn into surgery.  Leave suitcase in the car. After surgery it may be brought to your room.     Patients discharged the day of surgery will not be allowed to drive home. IF YOU ARE HAVING SURGERY AND GOING HOME THE SAME DAY, YOU MUST HAVE AN ADULT TO DRIVE YOU HOME AND BE WITH YOU FOR 24 HOURS. YOU MAY GO HOME BY TAXI OR UBER OR ORTHERWISE, BUT AN ADULT MUST ACCOMPANY  YOU HOME AND STAY WITH YOU FOR 24 HOURS.  Name and phone number of your driver:                Please read over the following fact sheets you were given: _____________________________________________________________________             [facility] - Preparing for Surgery Before surgery, you can play an important role.  Because skin is not sterile, your skin needs to be as free of germs as possible.  You can reduce the number of germs on your skin by washing with CHG (chlorahexidine gluconate) soap before surgery.  CHG is an antiseptic cleaner which kills germs and bonds with the skin to continue killing germs even after washing. Please DO NOT use if you have an allergy  to CHG or antibacterial soaps.  If your skin becomes reddened/irritated stop using the CHG and inform your nurse when you arrive at Short Stay. Do not shave (including legs and underarms) for at least 48 hours prior to the first CHG shower.  You may shave your face/neck. Please follow these instructions carefully:  1.  Shower with CHG Soap the night before surgery and the  morning of Surgery.  2.  If you choose to wash your hair, wash your hair first as usual with your  normal  shampoo.  3.  After you shampoo, rinse your hair and body thoroughly to remove the  shampoo.                           4.  Use CHG as you would any other liquid soap.  You can apply chg directly  to the skin and wash                       Gently with a scrungie or clean washcloth.  5.  Apply the CHG Soap to your body ONLY FROM THE NECK DOWN.   Do not use on face/ open                           Wound or open sores. Avoid contact with eyes, ears mouth and genitals (private parts).                       Wash face,  Genitals (private parts) with your normal soap.             6.  Wash thoroughly, paying special attention to the area where your surgery  will be performed.  7.  Thoroughly rinse your body with warm water from the neck down.  8.  DO NOT  shower/wash with your normal soap after using and rinsing off  the CHG Soap.                9.  Pat yourself dry with a clean towel.            10.  Wear clean pajamas.            11.  Place clean sheets on your bed the night of your first shower and do not  sleep with pets. Day of Surgery : Do not apply any lotions/deodorants the morning of surgery.  Please wear clean clothes to the hospital/surgery center.  FAILURE TO FOLLOW THESE INSTRUCTIONS MAY RESULT IN THE CANCELLATION OF YOUR SURGERY PATIENT SIGNATURE_________________________________  NURSE SIGNATURE__________________________________  ________________________________________________________________________

## 2019-10-08 ENCOUNTER — Telehealth: Payer: Self-pay | Admitting: Internal Medicine

## 2019-10-08 ENCOUNTER — Other Ambulatory Visit: Payer: Self-pay | Admitting: Internal Medicine

## 2019-10-08 DIAGNOSIS — N200 Calculus of kidney: Secondary | ICD-10-CM | POA: Diagnosis not present

## 2019-10-08 DIAGNOSIS — D631 Anemia in chronic kidney disease: Secondary | ICD-10-CM | POA: Diagnosis not present

## 2019-10-08 DIAGNOSIS — M47812 Spondylosis without myelopathy or radiculopathy, cervical region: Secondary | ICD-10-CM | POA: Diagnosis not present

## 2019-10-08 DIAGNOSIS — Z466 Encounter for fitting and adjustment of urinary device: Secondary | ICD-10-CM | POA: Diagnosis not present

## 2019-10-08 DIAGNOSIS — M47816 Spondylosis without myelopathy or radiculopathy, lumbar region: Secondary | ICD-10-CM | POA: Diagnosis not present

## 2019-10-08 DIAGNOSIS — I120 Hypertensive chronic kidney disease with stage 5 chronic kidney disease or end stage renal disease: Secondary | ICD-10-CM | POA: Diagnosis not present

## 2019-10-08 DIAGNOSIS — N186 End stage renal disease: Secondary | ICD-10-CM | POA: Diagnosis not present

## 2019-10-08 DIAGNOSIS — E11319 Type 2 diabetes mellitus with unspecified diabetic retinopathy without macular edema: Secondary | ICD-10-CM | POA: Diagnosis not present

## 2019-10-08 DIAGNOSIS — E1122 Type 2 diabetes mellitus with diabetic chronic kidney disease: Secondary | ICD-10-CM | POA: Diagnosis not present

## 2019-10-08 MED FILL — FINASTERIDE 5 MG TABLET: 5 | 30 days supply | Qty: 30 | Fill #2

## 2019-10-08 MED FILL — SODIUM BICARBONATE 650 MG T: 650 | 90 days supply | Qty: 180 | Fill #1

## 2019-10-08 NOTE — Telephone Encounter (Signed)
Ask to evaluate patient for upcoming TURP procedure with urology.   This individual is at low risk for cardiovascular event during the low to moderate risk surgery. The revised cardiovascular risk index is one and is associated with a six percent risk of major cardiovascular events. He has the following risk factors: pre-operative creatinine > 66m/dL.  He has stage 4 CKD. He does have the ability to perform > 4 MET levels of activity and has no active cardiac conditions. I disiscussed the following with patients wife : stopping Asprin 5 days prior to surgery. He takes metoprolol and amlodipine for hypertension. He can continue these medications unless advised to stop by his surgical team. Patient is at an acceptable risk for the surgery.

## 2019-10-09 DIAGNOSIS — N186 End stage renal disease: Secondary | ICD-10-CM | POA: Diagnosis not present

## 2019-10-09 DIAGNOSIS — M47816 Spondylosis without myelopathy or radiculopathy, lumbar region: Secondary | ICD-10-CM | POA: Diagnosis not present

## 2019-10-09 DIAGNOSIS — Z466 Encounter for fitting and adjustment of urinary device: Secondary | ICD-10-CM | POA: Diagnosis not present

## 2019-10-09 DIAGNOSIS — D631 Anemia in chronic kidney disease: Secondary | ICD-10-CM | POA: Diagnosis not present

## 2019-10-09 DIAGNOSIS — I120 Hypertensive chronic kidney disease with stage 5 chronic kidney disease or end stage renal disease: Secondary | ICD-10-CM | POA: Diagnosis not present

## 2019-10-09 DIAGNOSIS — M47812 Spondylosis without myelopathy or radiculopathy, cervical region: Secondary | ICD-10-CM | POA: Diagnosis not present

## 2019-10-09 DIAGNOSIS — E1122 Type 2 diabetes mellitus with diabetic chronic kidney disease: Secondary | ICD-10-CM | POA: Diagnosis not present

## 2019-10-09 DIAGNOSIS — E11319 Type 2 diabetes mellitus with unspecified diabetic retinopathy without macular edema: Secondary | ICD-10-CM | POA: Diagnosis not present

## 2019-10-09 DIAGNOSIS — N200 Calculus of kidney: Secondary | ICD-10-CM | POA: Diagnosis not present

## 2019-10-12 ENCOUNTER — Telehealth: Payer: Self-pay | Admitting: Internal Medicine

## 2019-10-12 ENCOUNTER — Encounter (HOSPITAL_COMMUNITY): Admission: RE | Admit: 2019-10-12 | Payer: Medicare PPO | Source: Ambulatory Visit

## 2019-10-12 MED FILL — FUROSEMIDE 40 MG TAB: 40 | 90 days supply | Qty: 180 | Fill #0

## 2019-10-12 MED FILL — TAMSULOSIN HCL 0.4 MG CAP: 0.4 | 90 days supply | Qty: 90 | Fill #0

## 2019-10-12 NOTE — Telephone Encounter (Signed)
Patient was called at the request of Dr. Darrick Meigs to schedule an appt for surgical clearance.  Patient agreed to an appt on 10/13/2019 at 3:45 pm.

## 2019-10-13 ENCOUNTER — Encounter (HOSPITAL_COMMUNITY)
Admission: RE | Admit: 2019-10-13 | Discharge: 2019-10-13 | Disposition: A | Discharge status: Home / Self Care | Payer: Medicare PPO | Source: Ambulatory Visit | Attending: Urology | Admitting: Urology

## 2019-10-13 ENCOUNTER — Ambulatory Visit: Payer: Medicare PPO

## 2019-10-13 ENCOUNTER — Other Ambulatory Visit: Payer: Self-pay

## 2019-10-13 DIAGNOSIS — R338 Other retention of urine: Secondary | ICD-10-CM | POA: Diagnosis not present

## 2019-10-13 DIAGNOSIS — R3914 Feeling of incomplete bladder emptying: Secondary | ICD-10-CM | POA: Diagnosis not present

## 2019-10-13 DIAGNOSIS — I130 Hypertensive heart and chronic kidney disease with heart failure and stage 1 through stage 4 chronic kidney disease, or unspecified chronic kidney disease: Secondary | ICD-10-CM | POA: Diagnosis not present

## 2019-10-13 DIAGNOSIS — R0602 Shortness of breath: Secondary | ICD-10-CM | POA: Diagnosis not present

## 2019-10-13 LAB — CBC
HCT: 29.7 % — ABNORMAL LOW (ref 39.0–52.0)
Hemoglobin: 9.5 g/dL — ABNORMAL LOW (ref 13.0–17.0)
MCH: 30.3 pg (ref 26.0–34.0)
MCHC: 32 g/dL (ref 30.0–36.0)
MCV: 94.6 fL (ref 80.0–100.0)
Platelets: 258 10*3/uL (ref 150–400)
RBC: 3.14 MIL/uL — ABNORMAL LOW (ref 4.22–5.81)
RDW: 13.6 % (ref 11.5–15.5)
WBC: 5.2 10*3/uL (ref 4.0–10.5)
nRBC: 0 % (ref 0.0–0.2)

## 2019-10-13 LAB — BASIC METABOLIC PANEL
Anion gap: 9 (ref 5–15)
BUN: 51 mg/dL — ABNORMAL HIGH (ref 8–23)
CO2: 22 mmol/L (ref 22–32)
Calcium: 9 mg/dL (ref 8.9–10.3)
Chloride: 110 mmol/L (ref 98–111)
Creatinine, Ser: 3.26 mg/dL — ABNORMAL HIGH (ref 0.61–1.24)
GFR calc Af Amer: 21 mL/min — ABNORMAL LOW (ref >60)
GFR calc non Af Amer: 18 mL/min — ABNORMAL LOW (ref >60)
Glucose, Bld: 136 mg/dL — ABNORMAL HIGH (ref 70–99)
Potassium: 4.5 mmol/L (ref 3.5–5.1)
Sodium: 141 mmol/L (ref 135–145)

## 2019-10-14 ENCOUNTER — Inpatient Hospital Stay (HOSPITAL_COMMUNITY)
Admission: EM | Admit: 2019-10-14 | Discharge: 2019-10-22 | DRG: 987 | Disposition: A | Discharge status: Home Health | Payer: Medicare PPO | Attending: Internal Medicine | Admitting: Internal Medicine

## 2019-10-14 ENCOUNTER — Other Ambulatory Visit: Payer: Self-pay

## 2019-10-14 ENCOUNTER — Encounter (HOSPITAL_COMMUNITY): Payer: Self-pay | Admitting: *Deleted

## 2019-10-14 ENCOUNTER — Emergency Department (HOSPITAL_COMMUNITY): Payer: Medicare PPO

## 2019-10-14 ENCOUNTER — Telehealth: Payer: Self-pay | Admitting: Internal Medicine

## 2019-10-14 ENCOUNTER — Ambulatory Visit: Payer: Medicare PPO | Admitting: Neurology

## 2019-10-14 ENCOUNTER — Encounter: Payer: Self-pay | Admitting: Neurology

## 2019-10-14 VITALS — BP 140/96 | HR 68 | Temp 96.5°F | Ht 72.0 in | Wt 178.0 lb

## 2019-10-14 DIAGNOSIS — I340 Nonrheumatic mitral (valve) insufficiency: Secondary | ICD-10-CM | POA: Diagnosis present

## 2019-10-14 DIAGNOSIS — Z20822 Contact with and (suspected) exposure to covid-19: Secondary | ICD-10-CM | POA: Diagnosis present

## 2019-10-14 DIAGNOSIS — J90 Pleural effusion, not elsewhere classified: Secondary | ICD-10-CM | POA: Diagnosis present

## 2019-10-14 DIAGNOSIS — N401 Enlarged prostate with lower urinary tract symptoms: Secondary | ICD-10-CM | POA: Diagnosis not present

## 2019-10-14 DIAGNOSIS — E1122 Type 2 diabetes mellitus with diabetic chronic kidney disease: Secondary | ICD-10-CM | POA: Diagnosis present

## 2019-10-14 DIAGNOSIS — J948 Other specified pleural conditions: Secondary | ICD-10-CM

## 2019-10-14 DIAGNOSIS — N138 Other obstructive and reflux uropathy: Secondary | ICD-10-CM | POA: Diagnosis present

## 2019-10-14 DIAGNOSIS — Z8673 Personal history of transient ischemic attack (TIA), and cerebral infarction without residual deficits: Secondary | ICD-10-CM

## 2019-10-14 DIAGNOSIS — N184 Chronic kidney disease, stage 4 (severe): Secondary | ICD-10-CM | POA: Diagnosis present

## 2019-10-14 DIAGNOSIS — Z9079 Acquired absence of other genital organ(s): Secondary | ICD-10-CM

## 2019-10-14 DIAGNOSIS — H409 Unspecified glaucoma: Secondary | ICD-10-CM | POA: Diagnosis present

## 2019-10-14 DIAGNOSIS — R338 Other retention of urine: Secondary | ICD-10-CM | POA: Diagnosis present

## 2019-10-14 DIAGNOSIS — R269 Unspecified abnormalities of gait and mobility: Secondary | ICD-10-CM | POA: Diagnosis not present

## 2019-10-14 DIAGNOSIS — E8770 Fluid overload, unspecified: Secondary | ICD-10-CM | POA: Diagnosis not present

## 2019-10-14 DIAGNOSIS — N049 Nephrotic syndrome with unspecified morphologic changes: Secondary | ICD-10-CM

## 2019-10-14 DIAGNOSIS — I255 Ischemic cardiomyopathy: Secondary | ICD-10-CM | POA: Diagnosis not present

## 2019-10-14 DIAGNOSIS — Z79899 Other long term (current) drug therapy: Secondary | ICD-10-CM | POA: Diagnosis not present

## 2019-10-14 DIAGNOSIS — R0600 Dyspnea, unspecified: Secondary | ICD-10-CM | POA: Diagnosis not present

## 2019-10-14 DIAGNOSIS — R29898 Other symptoms and signs involving the musculoskeletal system: Secondary | ICD-10-CM

## 2019-10-14 DIAGNOSIS — R9431 Abnormal electrocardiogram [ECG] [EKG]: Secondary | ICD-10-CM | POA: Diagnosis not present

## 2019-10-14 DIAGNOSIS — M1990 Unspecified osteoarthritis, unspecified site: Secondary | ICD-10-CM | POA: Diagnosis present

## 2019-10-14 DIAGNOSIS — E785 Hyperlipidemia, unspecified: Secondary | ICD-10-CM | POA: Diagnosis present

## 2019-10-14 DIAGNOSIS — N320 Bladder-neck obstruction: Secondary | ICD-10-CM | POA: Diagnosis not present

## 2019-10-14 DIAGNOSIS — Z8249 Family history of ischemic heart disease and other diseases of the circulatory system: Secondary | ICD-10-CM

## 2019-10-14 DIAGNOSIS — E46 Unspecified protein-calorie malnutrition: Secondary | ICD-10-CM | POA: Diagnosis present

## 2019-10-14 DIAGNOSIS — I5041 Acute combined systolic (congestive) and diastolic (congestive) heart failure: Secondary | ICD-10-CM | POA: Diagnosis not present

## 2019-10-14 DIAGNOSIS — I5021 Acute systolic (congestive) heart failure: Secondary | ICD-10-CM | POA: Diagnosis not present

## 2019-10-14 DIAGNOSIS — I1310 Hypertensive heart and chronic kidney disease without heart failure, with stage 1 through stage 4 chronic kidney disease, or unspecified chronic kidney disease: Secondary | ICD-10-CM | POA: Diagnosis present

## 2019-10-14 DIAGNOSIS — D649 Anemia, unspecified: Secondary | ICD-10-CM | POA: Diagnosis not present

## 2019-10-14 DIAGNOSIS — I129 Hypertensive chronic kidney disease with stage 1 through stage 4 chronic kidney disease, or unspecified chronic kidney disease: Secondary | ICD-10-CM | POA: Diagnosis not present

## 2019-10-14 DIAGNOSIS — I130 Hypertensive heart and chronic kidney disease with heart failure and stage 1 through stage 4 chronic kidney disease, or unspecified chronic kidney disease: Principal | ICD-10-CM | POA: Diagnosis present

## 2019-10-14 DIAGNOSIS — Z6821 Body mass index (BMI) 21.0-21.9, adult: Secondary | ICD-10-CM

## 2019-10-14 DIAGNOSIS — Z83438 Family history of other disorder of lipoprotein metabolism and other lipidemia: Secondary | ICD-10-CM

## 2019-10-14 DIAGNOSIS — N400 Enlarged prostate without lower urinary tract symptoms: Secondary | ICD-10-CM | POA: Diagnosis not present

## 2019-10-14 DIAGNOSIS — E1129 Type 2 diabetes mellitus with other diabetic kidney complication: Secondary | ICD-10-CM | POA: Diagnosis not present

## 2019-10-14 DIAGNOSIS — R0602 Shortness of breath: Secondary | ICD-10-CM

## 2019-10-14 DIAGNOSIS — Z960 Presence of urogenital implants: Secondary | ICD-10-CM | POA: Diagnosis not present

## 2019-10-14 DIAGNOSIS — Z87891 Personal history of nicotine dependence: Secondary | ICD-10-CM

## 2019-10-14 DIAGNOSIS — Z9889 Other specified postprocedural states: Secondary | ICD-10-CM

## 2019-10-14 DIAGNOSIS — R0603 Acute respiratory distress: Secondary | ICD-10-CM | POA: Diagnosis present

## 2019-10-14 LAB — BASIC METABOLIC PANEL
Anion gap: 11 (ref 5–15)
BUN: 47 mg/dL — ABNORMAL HIGH (ref 8–23)
CO2: 23 mmol/L (ref 22–32)
Calcium: 9.2 mg/dL (ref 8.9–10.3)
Chloride: 106 mmol/L (ref 98–111)
Creatinine, Ser: 3.27 mg/dL — ABNORMAL HIGH (ref 0.61–1.24)
GFR calc Af Amer: 21 mL/min — ABNORMAL LOW (ref >60)
GFR calc non Af Amer: 18 mL/min — ABNORMAL LOW (ref >60)
Glucose, Bld: 151 mg/dL — ABNORMAL HIGH (ref 70–99)
Potassium: 5 mmol/L (ref 3.5–5.1)
Sodium: 140 mmol/L (ref 135–145)

## 2019-10-14 LAB — CBC
HCT: 32.1 % — ABNORMAL LOW (ref 39.0–52.0)
Hemoglobin: 10.1 g/dL — ABNORMAL LOW (ref 13.0–17.0)
MCH: 29.4 pg (ref 26.0–34.0)
MCHC: 31.5 g/dL (ref 30.0–36.0)
MCV: 93.6 fL (ref 80.0–100.0)
Platelets: 265 10*3/uL (ref 150–400)
RBC: 3.43 MIL/uL — ABNORMAL LOW (ref 4.22–5.81)
RDW: 13.3 % (ref 11.5–15.5)
WBC: 5.8 10*3/uL (ref 4.0–10.5)
nRBC: 0 % (ref 0.0–0.2)

## 2019-10-14 LAB — HEMOGLOBIN A1C
Hgb A1c MFr Bld: 5.2 % (ref 4.8–5.6)
Mean Plasma Glucose: 103 mg/dL

## 2019-10-14 LAB — TROPONIN I (HIGH SENSITIVITY): Troponin I (High Sensitivity): 20 ng/L — ABNORMAL HIGH (ref <18)

## 2019-10-14 MED ORDER — SODIUM CHLORIDE 0.9% FLUSH: 3.0000 mL | Freq: Once | INTRAVENOUS | Status: DC

## 2019-10-14 NOTE — Patient Instructions (Signed)
I had a long discussion with the patient, his son-in-law as well as daughter whom I spoke to over the phone regarding his subacute leg weakness and gait difficulties which I suspect may be due to combination of new interval strokes as well as deconditioning from his nephrotic syndrome and renal failure.  I recommend continuing ongoing home physical occupational therapy and gait training.  Resume aspirin 81 mg daily after his his upcoming prostate surgery when safe.  Check MRI scan of the brain, cervical spine as well as MRA of the brain and neck without contrast.  Check lipid profile.  Maintain aggressive risk factor modification with strict control of hypertension with blood pressure goal below 130/90, lipids with LDL cholesterol goal below 70 mg percent and diabetes with hemoglobin A1c goal below 6.5%.  Return for follow-up in the future in 2 months or call earlier if necessary.

## 2019-10-14 NOTE — Telephone Encounter (Signed)
Received call from Dennis Macias' partner making me aware that he was experiencing labored and difficulty breathing. I instructed her to report to the emergency department due to concern for volume overload as Dennis Macias has a history of anasarca, CKD IV with prio pleural effusions. She expressed understanding.

## 2019-10-14 NOTE — Progress Notes (Signed)
[facility] [address] [place]. [place] [address] [phone]       OFFICE CONSULT NOTE  [name] Date of Birth:  07-08-1951 Medical Record Number:  [id number]   Referring MD: Duffy Rhody Reason for Referral: Bilateral leg weakness HPI: [name] is  69 year old African-American gentleman who is seen today for office consultation visit for leg weakness.  He is accompanied by his son-in-law as well as his daughter is present for this visit over the phone.  History is obtained from them, review of referral notes and I personally reviewed imaging films in PACS. Patient has been having increasing gait difficulties for the last 6 months.  He has remote history of bilateral lacunar infarcts in 2017 at that time he had seen me.  Had mild residual left-sided weakness and gait difficulties but was able to walk independently.  For the last 6 months he has had progressive difficulty walking and feels that his legs are weak and give out.  He occasionally has numbness in his feet but this is not bothersome.  He feels his right leg seems to be weaker now than the left one.  He denies significant back pain, radicular pain.  He has had problems with his prostate in the last several months and had urinary retention and chronic indwelling catheter.  He in fact is planning on having prostate surgery in the next few weeks.  Last month he was also diagnosed with bilateral pleural effusion and ascites which is required thoracocentesis but fluid analysis showed it to be a transudate and likely related to his hypoalbuminemia and anemia and renal failure.  Review of lab work show that on 10/13/2019 hemoglobin A1c was 5.2.  Hematocrit was 29.2 and albumin was low at 2.2.  Patient did have CT scan of the head on 08/05/2019 when he had a fall and sustained a left frontal contusion.  There is no acute brain abnormalities noted.  CT scan of cervical spine at that time showed spondylitic changes at  C3/4.  On inquiry the patient and daughter state that he did have an episode in through the end of January this year when he had some speech difficulties with garbled speech and some confusion he was seen in the emergency room with symptoms resolved and apparently brain scan was unremarkable and he was discharged home.  Patient was on aspirin until he started having some bleeding from his bladder and it was discontinued.  He has been tolerating Lipitor well without muscle aches and pains but is unclear as to when his last lipid profile was checked.  He denies any decreased appetite, weight loss or low-grade fevers. Prior stroke 09/12/2015-MRI scan showed 2 small acute lacunar infarcts in the right mid to posterior frontal lobe and a small acute infarct in the left anterior medial left frontal lobe. MRA of the brain showed no significant large vessel stenosis left terminal vertebral artery was not and irregular. LDL cholesterol is elevated at 148 and hemoglobin A1c at 10.7. Transesophageal echocardiogram showed normal ejection fraction of 60-65% and no cardiac source of embolism or PFO. Carotid Doppler showed no significant extracranial stenosis. Patient was started on aspirin for stroke prevention and Lipitor. ROS:   14 system review of systems is positive for weakness, gait difficulty, imbalance, back pain, urinary retention, prostate problems all other systems negative  PMH:  Past Medical History:  Diagnosis Date  . Anemia   . Arthritis    past hx   . Blindness  right eye  . Cataract    removed both eyes  . Dehydration   . Diabetes ([place])   . Glaucoma   . History of CVA (cerebrovascular accident) 09/13/2015  . History of urinary retention   . Hyperlipidemia   . Hypertension   . Pernicious anemia 02/24/2018  . Stroke [facility])    2017- March  . Tachycardia 08/26/2017  . Tubular adenoma of colon 02/2017  . Weight loss, non-intentional 08/26/2017   10 lbs between 6/18 & 2/19    Social History:    Social History   Socioeconomic History  . Marital status: Married    Spouse name: Not on file  . Number of children: Not on file  . Years of education: Not on file  . Highest education level: Not on file  Occupational History  . Not on file  Tobacco Use  . Smoking status: Former Research scientist (life sciences)  . Smokeless tobacco: Former Systems developer    Types: Chew    Quit date: 07/09/1978  . Tobacco comment: quit 1 year ago  Substance and Sexual Activity  . Alcohol use: No    Alcohol/week: 1.0 standard drinks    Types: 1 Cans of beer per week    Comment: quit last march/2017  . Drug use: No  . Sexual activity: Not on file  Other Topics Concern  . Not on file  Social History Narrative  . Not on file   Social Determinants of Health   Financial Resource Strain:   . Difficulty of Paying Living Expenses:   Food Insecurity:   . Worried About Charity fundraiser in the Last Year:   . Arboriculturist in the Last Year:   Transportation Needs:   . Film/video editor (Medical):   Marland Kitchen Lack of Transportation (Non-Medical):   Physical Activity:   . Days of Exercise per Week:   . Minutes of Exercise per Session:   Stress:   . Feeling of Stress :   Social Connections:   . Frequency of Communication with Friends and Family:   . Frequency of Social Gatherings with Friends and Family:   . Attends Religious Services:   . Active Member of Clubs or Organizations:   . Attends Archivist Meetings:   Marland Kitchen Marital Status:   Intimate Partner Violence:   . Fear of Current or Ex-Partner:   . Emotionally Abused:   Marland Kitchen Physically Abused:   . Sexually Abused:     Medications:   Current Outpatient Medications on File Prior to Visit  Medication Sig Dispense Refill  . ACCU-CHEK FASTCLIX LANCETS MISC Check blood sugar up to 7 times a week as instructed 102 each 3  . amLODipine (NORVASC) 10 MG tablet Take 1 tablet (10 mg total) by mouth daily. 90 tablet 3  . atorvastatin (LIPITOR) 80 MG tablet Take 1 tablet (80 mg  total) by mouth at bedtime. IM program 90 tablet 3  . Blood Glucose Monitoring Suppl (ACCU-CHEK GUIDE) w/Device KIT 1 each by Does not apply route daily. Check blood sugar as instructed up to 7 times a week 1 kit 0  . dorzolamide-timolol (COSOPT) 22.3-6.8 MG/ML ophthalmic solution Place 1 drop into the right eye 2 (two) times daily.   10  . ferrous gluconate (FERGON) 324 MG tablet Take 324 mg by mouth daily with breakfast.    . finasteride (PROSCAR) 5 MG tablet Take 5 mg by mouth daily.    . furosemide (LASIX) 40 MG tablet TAKE 1 TABLET (40 MG  TOTAL) BY MOUTH 2 (TWO) TIMES DAILY. 180 tablet 0  . glucose blood (ACCU-CHEK GUIDE) test strip Check blood sugar up to 7 times a week as instructed 100 each 3  . metoprolol tartrate (LOPRESSOR) 25 MG tablet Take 0.5 tablets (12.5 mg total) by mouth 2 (two) times daily. 30 tablet 5  . sodium bicarbonate 650 MG tablet Take 650 mg by mouth 2 (two) times daily.    . tamsulosin (FLOMAX) 0.4 MG CAPS capsule TAKE 1 CAPSULE (0.4 MG TOTAL) BY MOUTH DAILY AFTER SUPPER. 90 capsule 0  . vitamin B-12 (CYANOCOBALAMIN) 1000 MCG tablet Take 1,000 mcg by mouth daily.     No current facility-administered medications on file prior to visit.    Allergies:  No Known Allergies  Physical Exam General: Frail elderly African-American male, seated, in no evident distress Head: head normocephalic and atraumatic.   Neck: supple with no carotid or supraclavicular bruits Cardiovascular: regular rate and rhythm, no murmurs Musculoskeletal: no deformity Skin:  no rash/petichiae Vascular:  Normal pulses all extremities  Neurologic Exam Mental Status: Awake and fully alert. Oriented to place and time. Recent and remote memory intact. Attention span, concentration and fund of knowledge appropriate. Mood and affect appropriate.  Speech slightly hypophonic but clear without dysarthria.  [place] Tech is brisk. Cranial Nerves: Fundoscopic exam reveals sharp disc margins. Pupils equal,  briskly reactive to light. Extraocular movements full without nystagmus. Visual fields full to confrontation. Hearing intact. Facial sensation intact.  Mild left lower facial asymmetry when he smiles.  Tongue, palate moves normally and symmetrically.  Motor: Tone is increased in the left upper and lower extremity.  Weakness of bilateral grip left greater than right and left elbow flexors, extensors and shoulder abductor's.  Mild weakness of bilateral hip flexors left better than right and ankle dorsiflexors again left greater than right. Sensory.: intact to touch , pinprick , position but diminished vibratory sensation bilaterally from ankle down.  Coordination finger-to-nose and needle coordination impaired slightly on the left compared to the right. Gait and Station: Arises from chair with  difficulty and two-person assist. Stance is stooped l. Gait demonstrates stiffness of the legs left greater than right.  Unable to walk tandem  reflexes: 2+ and asymmetric and brisker on the left compared to the right. Toes downgoing.   NIHSS  3 Modified [place]  3  ASSESSMENT: 69 year old African-American male with subacute gait difficulties and leg weakness of unclear etiology possibly interval new strokes and deconditioning due to his ongoing renal failure and medical issues.  Remote history of lacunar infarcts in 2017     PLAN: I had a long discussion with the patient, his son-in-law as well as daughter whom I spoke to over the phone regarding his subacute leg weakness and gait difficulties which I suspect may be due to combination of new interval strokes as well as deconditioning from his nephrotic syndrome and renal failure.  I recommend continuing ongoing home physical occupational therapy and gait training.  Resume aspirin 81 mg daily after his his upcoming prostate surgery when safe.  Check MRI scan of the brain, cervical spine as well as MRA of the brain and neck without contrast.  Check lipid profile.   Maintain aggressive risk factor modification with strict control of hypertension with blood pressure goal below 130/90, lipids with LDL cholesterol goal below 70 mg percent and diabetes with hemoglobin A1c goal below 6.5%.  Greater than 50% time during this 50-minute consultation visit was spent in counseling and coordination of  care about his leg weakness gait difficulties and stroke and answering questions return for follow-up in the future in 2 months or call earlier if necessary. Antony Contras, MD  [facility] [address] [place], [place] [address]  Phone [phone] Fax [phone] Note: This document was prepared with digital dictation and possible smart phrase technology. Any transcriptional errors that result from this process are unintentional.

## 2019-10-14 NOTE — ED Triage Notes (Signed)
The pt arrived by gems from home  The pt reports that he has been sob for one week  No pain  Very very hard of heraing

## 2019-10-15 ENCOUNTER — Other Ambulatory Visit: Payer: Self-pay

## 2019-10-15 ENCOUNTER — Inpatient Hospital Stay (HOSPITAL_COMMUNITY): Payer: Medicare PPO

## 2019-10-15 ENCOUNTER — Telehealth: Payer: Self-pay | Admitting: Neurology

## 2019-10-15 DIAGNOSIS — Z83438 Family history of other disorder of lipoprotein metabolism and other lipidemia: Secondary | ICD-10-CM | POA: Diagnosis not present

## 2019-10-15 DIAGNOSIS — I5041 Acute combined systolic (congestive) and diastolic (congestive) heart failure: Secondary | ICD-10-CM | POA: Diagnosis present

## 2019-10-15 DIAGNOSIS — E1122 Type 2 diabetes mellitus with diabetic chronic kidney disease: Secondary | ICD-10-CM | POA: Diagnosis present

## 2019-10-15 DIAGNOSIS — N049 Nephrotic syndrome with unspecified morphologic changes: Secondary | ICD-10-CM | POA: Diagnosis present

## 2019-10-15 DIAGNOSIS — M1990 Unspecified osteoarthritis, unspecified site: Secondary | ICD-10-CM | POA: Diagnosis present

## 2019-10-15 DIAGNOSIS — N138 Other obstructive and reflux uropathy: Secondary | ICD-10-CM | POA: Diagnosis present

## 2019-10-15 DIAGNOSIS — N184 Chronic kidney disease, stage 4 (severe): Secondary | ICD-10-CM | POA: Diagnosis present

## 2019-10-15 DIAGNOSIS — Z8673 Personal history of transient ischemic attack (TIA), and cerebral infarction without residual deficits: Secondary | ICD-10-CM | POA: Diagnosis not present

## 2019-10-15 DIAGNOSIS — J90 Pleural effusion, not elsewhere classified: Secondary | ICD-10-CM | POA: Diagnosis not present

## 2019-10-15 DIAGNOSIS — R0600 Dyspnea, unspecified: Secondary | ICD-10-CM

## 2019-10-15 DIAGNOSIS — R9431 Abnormal electrocardiogram [ECG] [EKG]: Secondary | ICD-10-CM | POA: Diagnosis not present

## 2019-10-15 DIAGNOSIS — R0602 Shortness of breath: Secondary | ICD-10-CM

## 2019-10-15 DIAGNOSIS — Z8249 Family history of ischemic heart disease and other diseases of the circulatory system: Secondary | ICD-10-CM | POA: Diagnosis not present

## 2019-10-15 DIAGNOSIS — H409 Unspecified glaucoma: Secondary | ICD-10-CM | POA: Diagnosis present

## 2019-10-15 DIAGNOSIS — E46 Unspecified protein-calorie malnutrition: Secondary | ICD-10-CM | POA: Diagnosis present

## 2019-10-15 DIAGNOSIS — I255 Ischemic cardiomyopathy: Secondary | ICD-10-CM | POA: Diagnosis present

## 2019-10-15 DIAGNOSIS — N401 Enlarged prostate with lower urinary tract symptoms: Secondary | ICD-10-CM | POA: Diagnosis present

## 2019-10-15 DIAGNOSIS — Z20822 Contact with and (suspected) exposure to covid-19: Secondary | ICD-10-CM | POA: Diagnosis present

## 2019-10-15 DIAGNOSIS — I340 Nonrheumatic mitral (valve) insufficiency: Secondary | ICD-10-CM | POA: Diagnosis present

## 2019-10-15 DIAGNOSIS — R338 Other retention of urine: Secondary | ICD-10-CM | POA: Diagnosis present

## 2019-10-15 DIAGNOSIS — Z6821 Body mass index (BMI) 21.0-21.9, adult: Secondary | ICD-10-CM | POA: Diagnosis not present

## 2019-10-15 DIAGNOSIS — Z87891 Personal history of nicotine dependence: Secondary | ICD-10-CM | POA: Diagnosis not present

## 2019-10-15 DIAGNOSIS — E785 Hyperlipidemia, unspecified: Secondary | ICD-10-CM | POA: Diagnosis present

## 2019-10-15 DIAGNOSIS — R0603 Acute respiratory distress: Secondary | ICD-10-CM | POA: Diagnosis present

## 2019-10-15 DIAGNOSIS — I130 Hypertensive heart and chronic kidney disease with heart failure and stage 1 through stage 4 chronic kidney disease, or unspecified chronic kidney disease: Secondary | ICD-10-CM | POA: Diagnosis present

## 2019-10-15 DIAGNOSIS — I5021 Acute systolic (congestive) heart failure: Secondary | ICD-10-CM | POA: Diagnosis not present

## 2019-10-15 LAB — URINALYSIS, COMPLETE (UACMP) WITH MICROSCOPIC
Bilirubin Urine: NEGATIVE
Glucose, UA: NEGATIVE mg/dL
Ketones, ur: NEGATIVE mg/dL
Nitrite: NEGATIVE
Protein, ur: 100 mg/dL — AB
Specific Gravity, Urine: 1.008 (ref 1.005–1.030)
pH: 7 (ref 5.0–8.0)

## 2019-10-15 LAB — HEPATIC FUNCTION PANEL
ALT: 13 U/L (ref 0–44)
AST: 15 U/L (ref 15–41)
Albumin: 2.8 g/dL — ABNORMAL LOW (ref 3.5–5.0)
Alkaline Phosphatase: 51 U/L (ref 38–126)
Bilirubin, Direct: 0.1 mg/dL (ref 0.0–0.2)
Indirect Bilirubin: 0.2 mg/dL — ABNORMAL LOW (ref 0.3–0.9)
Total Bilirubin: 0.3 mg/dL (ref 0.3–1.2)
Total Protein: 5.7 g/dL — ABNORMAL LOW (ref 6.5–8.1)

## 2019-10-15 LAB — LIPID PANEL
Chol/HDL Ratio: 1.9 ratio (ref 0.0–5.0)
Cholesterol, Total: 123 mg/dL (ref 100–199)
HDL: 65 mg/dL (ref >39)
LDL Chol Calc (NIH): 49 mg/dL (ref 0–99)
Triglycerides: 29 mg/dL (ref 0–149)
VLDL Cholesterol Cal: 9 mg/dL (ref 5–40)

## 2019-10-15 LAB — APTT: aPTT: 31 seconds (ref 24–36)

## 2019-10-15 LAB — SARS CORONAVIRUS 2 (TAT 6-24 HRS): SARS Coronavirus 2: NEGATIVE

## 2019-10-15 LAB — ECHOCARDIOGRAM COMPLETE
Height: 72 in
Weight: 2720 oz

## 2019-10-15 LAB — MAGNESIUM: Magnesium: 1.9 mg/dL (ref 1.7–2.4)

## 2019-10-15 LAB — PROTEIN / CREATININE RATIO, URINE
Creatinine, Urine: 20.54 mg/dL
Protein Creatinine Ratio: 6.28 mg/mg{Cre} — ABNORMAL HIGH (ref 0.00–0.15)
Total Protein, Urine: 129 mg/dL

## 2019-10-15 LAB — PROTIME-INR
INR: 1.5 — ABNORMAL HIGH (ref 0.8–1.2)
Prothrombin Time: 18.3 seconds — ABNORMAL HIGH (ref 11.4–15.2)

## 2019-10-15 LAB — BRAIN NATRIURETIC PEPTIDE: B Natriuretic Peptide: 1820 pg/mL — ABNORMAL HIGH (ref 0.0–100.0)

## 2019-10-15 LAB — TROPONIN I (HIGH SENSITIVITY): Troponin I (High Sensitivity): 19 ng/L — ABNORMAL HIGH (ref <18)

## 2019-10-15 MED ORDER — FINASTERIDE 5 MG PO TABS
5.0000 mg | ORAL_TABLET | Freq: Every day | ORAL | Status: DC
  Administered 2019-10-15 – 2019-10-18 (×4): 5 mg via ORAL
  Filled 2019-10-15 (×6): qty 1

## 2019-10-15 MED ORDER — VITAMIN B-12 1000 MCG PO TABS
1000.0000 ug | ORAL_TABLET | Freq: Every day | ORAL | Status: DC
  Administered 2019-10-15 – 2019-10-22 (×8): 1000 ug via ORAL
  Filled 2019-10-15 (×8): qty 1

## 2019-10-15 MED ORDER — HEPARIN SODIUM (PORCINE) 5000 UNIT/ML IJ SOLN
5000.0000 [IU] | Freq: Three times a day (TID) | INTRAMUSCULAR | Status: DC
  Administered 2019-10-15 – 2019-10-19 (×13): 5000 [IU] via SUBCUTANEOUS
  Filled 2019-10-15 (×13): qty 1

## 2019-10-15 MED ORDER — FUROSEMIDE 10 MG/ML IJ SOLN
60.0000 mg | Freq: Two times a day (BID) | INTRAMUSCULAR | Status: DC
  Administered 2019-10-15: 60 mg via INTRAVENOUS
  Filled 2019-10-15: qty 6

## 2019-10-15 MED ORDER — FUROSEMIDE 10 MG/ML IJ SOLN: 80.0000 mg | Freq: Two times a day (BID) | INTRAMUSCULAR | Status: DC

## 2019-10-15 MED ORDER — DORZOLAMIDE HCL-TIMOLOL MAL 2-0.5 % OP SOLN
1.0000 [drp] | Freq: Two times a day (BID) | OPHTHALMIC | Status: DC
  Administered 2019-10-15 – 2019-10-22 (×13): 1 [drp] via OPHTHALMIC
  Filled 2019-10-15 (×2): qty 10

## 2019-10-15 MED ORDER — ACETAMINOPHEN 325 MG PO TABS
650.0000 mg | ORAL_TABLET | Freq: Four times a day (QID) | ORAL | Status: DC | PRN
  Administered 2019-10-20: 650 mg via ORAL
  Filled 2019-10-15: qty 2

## 2019-10-15 MED ORDER — ATORVASTATIN CALCIUM 40 MG PO TABS
80.0000 mg | ORAL_TABLET | Freq: Every day | ORAL | Status: DC
  Administered 2019-10-15 – 2019-10-21 (×7): 80 mg via ORAL
  Filled 2019-10-15: qty 1
  Filled 2019-10-15 (×2): qty 2
  Filled 2019-10-15: qty 1
  Filled 2019-10-15: qty 2
  Filled 2019-10-15 (×2): qty 1

## 2019-10-15 MED ORDER — FUROSEMIDE 10 MG/ML IJ SOLN
80.0000 mg | Freq: Once | INTRAMUSCULAR | Status: AC
  Administered 2019-10-15: 80 mg via INTRAVENOUS
  Filled 2019-10-15: qty 8

## 2019-10-15 MED ORDER — CHLORHEXIDINE GLUCONATE CLOTH 2 % EX PADS
6.0000 | MEDICATED_PAD | Freq: Every day | CUTANEOUS | Status: DC
  Administered 2019-10-15 – 2019-10-22 (×7): 6 via TOPICAL

## 2019-10-15 MED ORDER — TAMSULOSIN HCL 0.4 MG PO CAPS
0.4000 mg | ORAL_CAPSULE | Freq: Every day | ORAL | Status: DC
  Administered 2019-10-15 – 2019-10-18 (×4): 0.4 mg via ORAL
  Filled 2019-10-15 (×4): qty 1

## 2019-10-15 MED ORDER — METOPROLOL TARTRATE 12.5 MG HALF TABLET
12.5000 mg | ORAL_TABLET | Freq: Two times a day (BID) | ORAL | Status: DC
  Administered 2019-10-15 – 2019-10-17 (×5): 12.5 mg via ORAL
  Filled 2019-10-15 (×5): qty 1

## 2019-10-15 MED ORDER — FERROUS GLUCONATE 324 (38 FE) MG PO TABS
324.0000 mg | ORAL_TABLET | Freq: Every day | ORAL | Status: DC
  Administered 2019-10-15 – 2019-10-22 (×7): 324 mg via ORAL
  Filled 2019-10-15 (×8): qty 1

## 2019-10-15 MED ORDER — ACETAMINOPHEN 650 MG RE SUPP: 650.0000 mg | Freq: Four times a day (QID) | RECTAL | Status: DC | PRN

## 2019-10-15 MED ORDER — AMLODIPINE BESYLATE 10 MG PO TABS
10.0000 mg | ORAL_TABLET | Freq: Every day | ORAL | Status: DC
  Administered 2019-10-15 – 2019-10-16 (×2): 10 mg via ORAL
  Filled 2019-10-15: qty 2
  Filled 2019-10-15: qty 1

## 2019-10-15 NOTE — Progress Notes (Signed)
PT Cancellation Note  Patient Details Name: Tayvian Holycross MRN: [id number] DOB: Jun 25, 1951   Cancelled Treatment:    Reason Eval/Treat Not Completed: Patient at procedure or test/unavailable Pt having echo. Will follow up as schedule allows.   Lou Miner, DPT  Acute Rehabilitation Services  Pager: [phone] Office: [phone]  Rudean Hitt 10/15/2019, 4:07 PM

## 2019-10-15 NOTE — ED Notes (Signed)
Pt called for VS, no answer

## 2019-10-15 NOTE — ED Notes (Signed)
Patient called x1 with no response 

## 2019-10-15 NOTE — Progress Notes (Signed)
  Echocardiogram 2D Echocardiogram has been performed.  Dennis Macias 10/15/2019, 4:28 PM

## 2019-10-15 NOTE — ED Notes (Signed)
Pt ambulated in the hall with walker. SpO2 beginning was 97% room air. While ambulation pt SpO2 maintained 93-94% room air with increased work or breathing. Gait stable.

## 2019-10-15 NOTE — H&P (Signed)
Date: 10/15/2019               Patient Name:  Dennis Macias MRN: [id number]  DOB: [birth year] Age / Sex: 69 y.o., male   PCP: [name], MD         Medical Service: Internal Medicine Teaching Service         Attending Physician: Dr. Velna Ochs, MD    First Contact: Dr. Marva Panda Pager: [phone]  Second Contact: Dr. Myrtie Hawk Pager: [phone]       After Hours (After 5p/  First Contact Pager: [phone]  weekends / holidays): Second Contact Pager: [phone]   Chief Complaint: "Shortness of breath"  History of Present Illness: Dennis Macias is a 69 yo M with a PMHx notable for CKD-IV with nephrotic syndrome, HTN, T2DM, BPH w/ obstruction and indwelling foley catheter, justatory hyperhidrosis, unintentional weight loss, hematuria, and anemia who presented with a one day history of shortness of breath.  Per the patient he has been doing well since his discharge from the hospital last month and had been following up with his outpatient appointments as scheduled.  Shortness of breath may have progressively worsened over the preceding 2 to 3 days but became most concerning today prior to his presentation to the ER.  He endorses only associated cough. He denied fever, chills, nausea, vomiting, headache, visual changes, abdominal pain, joint pain, rash, dysuria, hematuria, loose stool, constipation, myalgias, or other concerning symptoms.  He had a recent admission with discharge on 09/21/19 where he was initially admitted for excessive diaphoresis but found to have bilateral pleural effusions and worsening of his nephrotic syndrome with diffuse anasarca.  The patient's anasarca and bilateral pleural effusions prompted a therapeutic thoracentesis which revealed a transudative process consistent with his hypoalbuminemia with nephrotic syndrome.  He was to follow-up with nephrology for this.  Additionally, the patient was noted to have urinary retention and was felt that the acute worsening of his volume  retention was due to a urinary obstruction from BPH.  He is currently scheduled for a TURP with Dr. Jeffie Pollock at [facility] on 10/20/2019.  He was to continue on finasteride and tamsulosin as well as his indwelling urinary catheter until that time.  Meds:  Current Outpatient Medications  Medication Instructions  . ACCU-CHEK FASTCLIX LANCETS MISC Check blood sugar up to 7 times a week as instructed  . amLODipine (NORVASC) 10 mg, Oral, Daily  . atorvastatin (LIPITOR) 80 mg, Oral, Daily at bedtime, IM program  . Blood Glucose Monitoring Suppl (ACCU-CHEK GUIDE) w/Device KIT 1 each, Does not apply, Daily, Check blood sugar as instructed up to 7 times a week  . dorzolamide-timolol (COSOPT) 22.3-6.8 MG/ML ophthalmic solution 1 drop, Right Eye, 2 times daily  . ferrous gluconate (FERGON) 324 mg, Oral, Daily with breakfast  . finasteride (PROSCAR) 5 mg, Oral, Daily  . furosemide (LASIX) 40 mg, Oral, 2 times daily  . glucose blood (ACCU-CHEK GUIDE) test strip Check blood sugar up to 7 times a week as instructed  . metoprolol tartrate (LOPRESSOR) 12.5 mg, Oral, 2 times daily  . sodium bicarbonate 650 mg, Oral, 2 times daily  . tamsulosin (FLOMAX) 0.4 mg, Oral, Daily after supper  . vitamin B-12 (CYANOCOBALAMIN) 1,000 mcg, Oral, Daily   Allergies: Allergies as of 10/14/2019  . (No Known Allergies)   Past Medical History:  Diagnosis Date  . Anemia   . Arthritis    past hx   . Blindness    right eye  .  Cataract    removed both eyes  . Dehydration   . Diabetes ([place])   . Glaucoma   . History of CVA (cerebrovascular accident) 09/13/2015  . History of urinary retention   . Hyperlipidemia   . Hypertension   . Pernicious anemia 02/24/2018  . Stroke [facility])    2017- March  . Tachycardia 08/26/2017  . Tubular adenoma of colon 02/2017  . Weight loss, non-intentional 08/26/2017   10 lbs between 6/18 & 2/19   Family History:  Family History  Problem Relation Age of Onset  . Hypertension Mother    . Hyperlipidemia Mother   . Hyperlipidemia Father   . Colon cancer Neg Hx   . Colon polyps Neg Hx   . Esophageal cancer Neg Hx   . Rectal cancer Neg Hx   . Stomach cancer Neg Hx   Personally reviewed and confirmed  Social History:  Social History   Tobacco Use  . Smoking status: Former Research scientist (life sciences)  . Smokeless tobacco: Former Systems developer    Types: Chew    Quit date: 07/09/1978  . Tobacco comment: quit 1 year ago  Substance Use Topics  . Alcohol use: No    Alcohol/week: 1.0 standard drinks    Types: 1 Cans of beer per week    Comment: quit last march/2017  . Drug use: No  Personally reviewed and confirmed with patient  Review of Systems: A complete ROS was negative except as per HPI.   Physical Exam: Blood pressure (!) 179/94, pulse 78, temperature (!) 97.5 F (36.4 C), temperature source Oral, resp. rate 16, height 6' (1.829 m), weight 77.1 kg, SpO2 97 %. Physical Exam Constitutional:      General: He is not in acute distress.    Appearance: He is well-developed. He is not diaphoretic.  HENT:     Head: Normocephalic and atraumatic.  Eyes:     Conjunctiva/sclera: Conjunctivae normal.  Neck:     Vascular: JVD present.  Cardiovascular:     Rate and Rhythm: Normal rate and regular rhythm.     Heart sounds: No murmur.  Pulmonary:     Effort: Pulmonary effort is normal. No respiratory distress.     Breath sounds: No stridor. Examination of the left-middle field reveals decreased breath sounds. Examination of the right-lower field reveals decreased breath sounds. Examination of the left-lower field reveals decreased breath sounds. Decreased breath sounds present. No wheezing, rhonchi or rales.  Chest:     Chest wall: Edema present.  Abdominal:     General: Bowel sounds are normal. There is no distension.     Palpations: Abdomen is soft.     Comments: Marked anasarca  Musculoskeletal:     Cervical back: Normal range of motion.     Right lower leg: Edema present.     Left lower  leg: Edema present.  Skin:    General: Skin is warm.     Capillary Refill: Capillary refill takes less than 2 seconds.  Neurological:     General: No focal deficit present.     Mental Status: He is alert and oriented to person, place, and time.  Psychiatric:        Mood and Affect: Mood normal.        Behavior: Behavior normal. Behavior is not agitated.    EKG: personally reviewed my interpretation is 1st degree sinus block with RRB and left fascicular block  CXR: personally reviewed my interpretation is bilateral pleural effusions with left greater than right.  Assessment & Plan by Problem: Active Problems:   BPH with urinary obstruction   CKD (chronic kidney disease) stage 4, GFR 15-29 ml/min (HCC)   Pleural effusion   Dyspnea  Assessment: Aamari Strawderman is a 69 yo M with a PMHx notable for CKD-IV with nephrotic syndrome, HTN, T2DM, BPH w/ obstruction and indwelling foley catheter, justatory hyperhidrosis, unintentional weight loss, hematuria, and anemia who presented with a one day history of shortness of breath felt was secondary to volume overload from his nephrotic syndrome.  There is diffuse anasarca with marked pitting edema of the abdomen, chest and upper extremities in addition to the lower extremities.  Patient's BNP is elevated to greater than 1800.  He responded poorly to the initial dose of IV Lasix 80 mg in the ED which is to be expected with nephrotic syndrome.  He is being admitted for treatment and evaluation of his worsening volume overload.  Plan: Dyspnea: Bilateral pleural effusions: Patient's chest x-ray concerning for bilateral pleural effusions with left greater than right.  This is consistent with his exam today.  In the setting of his personal history of nephrotic syndrome I feel that this is most likely progression of such.  However, his BNP is markedly elevated to greater than 1800 with no prior listed in the chart.  The patient's most recent serum albumin listed  is 2.2 with a serum creatinine of 3.27 which is minimally changed from his prior on 09/21/2019.  The patient is not requiring supplemental oxygen at rest. Plan: -Echocardiogram ordered (BNP elevation greater than expected alone with CKD) -IV Lasix 60 mg twice daily -Low-sodium diet with fluid restriction -Hepatic panel ordered for albumin, AST/ALT, and bilirubin evaluation -CMP in a.m. -Telemetry monitoring during aggressive diuresis  CKD IV w/ nephrotic syndrome: The patient's baseline serum creatinine appears to be greater than 3 since September 2020.  He is new at baseline today at 3.27.  I spoke with Dr. Carolin Sicks of [facility] who agrees with IV diuretics and outpatient nephrology follow-up if he improves.  Most recent lipid panel LDL 49. Plan: -CMP in a.m. -IV Lasix as above -Hepatic panel ordered -Continue lovastatin 80 mg daily  HTN: Patient BP elevated again today at 179/94 likely 2/2 to volume overload. We will diurese him and continue his home Amlodipine.   BPH: Has seen Urology who recommended TURP on 4/13 while continuing tamsulosin and finasteride. Foley in place today Plan: -Continue tamsulosin and finasteride -F/up Urology outpatient -Continue foley catheter  Diet: Renal CM Code: Full DVT PPX: Heparin FE: None, WNL's Dispo: Admit patient to Inpatient with expected length of stay greater than 2 midnights.  Signed: Kathi Ludwig, MD 10/15/2019, 7:55 AM

## 2019-10-15 NOTE — Progress Notes (Signed)
Kindly inform patient that lab work for cholesterol was satisfactory

## 2019-10-15 NOTE — ED Notes (Signed)
Lunch ordered 

## 2019-10-15 NOTE — Progress Notes (Signed)
Patient arrived to unit in NAD, VS stable and patient free from pain. Patient oriented to room.

## 2019-10-15 NOTE — ED Notes (Signed)
Pt discharged in error py has hearing difficulty  Sitting in the waiting room  He had not left

## 2019-10-15 NOTE — Telephone Encounter (Signed)
[facility] pending faxed notes  

## 2019-10-16 ENCOUNTER — Encounter (HOSPITAL_COMMUNITY): Admission: RE | Admit: 2019-10-16 | Payer: Medicare PPO | Source: Ambulatory Visit

## 2019-10-16 ENCOUNTER — Inpatient Hospital Stay (HOSPITAL_COMMUNITY): Payer: Medicare PPO

## 2019-10-16 ENCOUNTER — Inpatient Hospital Stay (HOSPITAL_COMMUNITY): Admission: RE | Admit: 2019-10-16 | Payer: Medicare PPO | Source: Ambulatory Visit

## 2019-10-16 DIAGNOSIS — Z960 Presence of urogenital implants: Secondary | ICD-10-CM

## 2019-10-16 DIAGNOSIS — Z79899 Other long term (current) drug therapy: Secondary | ICD-10-CM

## 2019-10-16 DIAGNOSIS — I5021 Acute systolic (congestive) heart failure: Secondary | ICD-10-CM

## 2019-10-16 DIAGNOSIS — J90 Pleural effusion, not elsewhere classified: Secondary | ICD-10-CM

## 2019-10-16 DIAGNOSIS — N184 Chronic kidney disease, stage 4 (severe): Secondary | ICD-10-CM

## 2019-10-16 DIAGNOSIS — Z87891 Personal history of nicotine dependence: Secondary | ICD-10-CM

## 2019-10-16 DIAGNOSIS — I1310 Hypertensive heart and chronic kidney disease without heart failure, with stage 1 through stage 4 chronic kidney disease, or unspecified chronic kidney disease: Secondary | ICD-10-CM | POA: Diagnosis present

## 2019-10-16 DIAGNOSIS — E1122 Type 2 diabetes mellitus with diabetic chronic kidney disease: Secondary | ICD-10-CM

## 2019-10-16 HISTORY — PX: IR THORACENTESIS ASP PLEURAL SPACE W/IMG GUIDE: IMG5380

## 2019-10-16 LAB — CBC
HCT: 28.6 % — ABNORMAL LOW (ref 39.0–52.0)
Hemoglobin: 9.2 g/dL — ABNORMAL LOW (ref 13.0–17.0)
MCH: 29.8 pg (ref 26.0–34.0)
MCHC: 32.2 g/dL (ref 30.0–36.0)
MCV: 92.6 fL (ref 80.0–100.0)
Platelets: 223 K/uL (ref 150–400)
RBC: 3.09 MIL/uL — ABNORMAL LOW (ref 4.22–5.81)
RDW: 13.3 % (ref 11.5–15.5)
WBC: 4.1 K/uL (ref 4.0–10.5)
nRBC: 0 % (ref 0.0–0.2)

## 2019-10-16 LAB — COMPREHENSIVE METABOLIC PANEL WITH GFR
ALT: 11 U/L (ref 0–44)
AST: 14 U/L — ABNORMAL LOW (ref 15–41)
Albumin: 2.7 g/dL — ABNORMAL LOW (ref 3.5–5.0)
Alkaline Phosphatase: 55 U/L (ref 38–126)
Anion gap: 9 (ref 5–15)
BUN: 44 mg/dL — ABNORMAL HIGH (ref 8–23)
CO2: 24 mmol/L (ref 22–32)
Calcium: 9.1 mg/dL (ref 8.9–10.3)
Chloride: 108 mmol/L (ref 98–111)
Creatinine, Ser: 3.35 mg/dL — ABNORMAL HIGH (ref 0.61–1.24)
GFR calc Af Amer: 21 mL/min — ABNORMAL LOW
GFR calc non Af Amer: 18 mL/min — ABNORMAL LOW
Glucose, Bld: 128 mg/dL — ABNORMAL HIGH (ref 70–99)
Potassium: 5 mmol/L (ref 3.5–5.1)
Sodium: 141 mmol/L (ref 135–145)
Total Bilirubin: 0.6 mg/dL (ref 0.3–1.2)
Total Protein: 5.6 g/dL — ABNORMAL LOW (ref 6.5–8.1)

## 2019-10-16 LAB — COMPREHENSIVE METABOLIC PANEL
ALT: 11 U/L (ref 0–44)
AST: 14 U/L — ABNORMAL LOW (ref 15–41)
Albumin: 2.7 g/dL — ABNORMAL LOW (ref 3.5–5.0)
Alkaline Phosphatase: 50 U/L (ref 38–126)
Anion gap: 10 (ref 5–15)
BUN: 46 mg/dL — ABNORMAL HIGH (ref 8–23)
CO2: 23 mmol/L (ref 22–32)
Calcium: 8.7 mg/dL — ABNORMAL LOW (ref 8.9–10.3)
Chloride: 108 mmol/L (ref 98–111)
Creatinine, Ser: 3.44 mg/dL — ABNORMAL HIGH (ref 0.61–1.24)
GFR calc Af Amer: 20 mL/min — ABNORMAL LOW (ref >60)
GFR calc non Af Amer: 17 mL/min — ABNORMAL LOW (ref >60)
Glucose, Bld: 116 mg/dL — ABNORMAL HIGH (ref 70–99)
Potassium: 4.6 mmol/L (ref 3.5–5.1)
Sodium: 141 mmol/L (ref 135–145)
Total Bilirubin: 0.3 mg/dL (ref 0.3–1.2)
Total Protein: 5.6 g/dL — ABNORMAL LOW (ref 6.5–8.1)

## 2019-10-16 MED ORDER — LIDOCAINE HCL 1 % IJ SOLN
INTRAMUSCULAR | Status: AC
  Filled 2019-10-16: qty 20

## 2019-10-16 MED ORDER — FUROSEMIDE 10 MG/ML IJ SOLN
60.0000 mg | Freq: Two times a day (BID) | INTRAMUSCULAR | Status: DC
  Administered 2019-10-16: 60 mg via INTRAVENOUS
  Filled 2019-10-16: qty 6

## 2019-10-16 MED ORDER — FUROSEMIDE 10 MG/ML IJ SOLN: 80.0000 mg | Freq: Two times a day (BID) | INTRAMUSCULAR | Status: DC

## 2019-10-16 MED ORDER — ISOSORB DINITRATE-HYDRALAZINE 20-37.5 MG PO TABS
1.0000 | ORAL_TABLET | Freq: Three times a day (TID) | ORAL | Status: DC
  Administered 2019-10-16 – 2019-10-22 (×19): 1 via ORAL
  Filled 2019-10-16 (×20): qty 1

## 2019-10-16 MED ORDER — FUROSEMIDE 10 MG/ML IJ SOLN
80.0000 mg | Freq: Two times a day (BID) | INTRAMUSCULAR | Status: DC
  Administered 2019-10-16 – 2019-10-19 (×6): 80 mg via INTRAVENOUS
  Filled 2019-10-16 (×6): qty 8

## 2019-10-16 MED ORDER — REGADENOSON 0.4 MG/5ML IV SOLN
0.4000 mg | Freq: Once | INTRAVENOUS | Status: AC
  Filled 2019-10-16: qty 5

## 2019-10-16 NOTE — ED Provider Notes (Signed)
[facility] UNIT Provider Note   CSN: [id number] Arrival date & time: 10/14/19  2229     History Chief Complaint  Patient presents with  . Shortness of Breath    Dennis Macias is a 69 y.o. male.  Recently admitted for pleural effusion causing sob requiring thoracentesis. Symptom returned  acouple days ago. Worse with exertion.   The history is provided by the patient and medical records.  Shortness of Breath Severity:  Mild Onset quality:  Gradual Duration:  2 days Timing:  Constant Progression:  Worsening Chronicity:  Recurrent Context: activity   Relieved by:  None tried Worsened by:  Nothing Ineffective treatments:  None tried Associated symptoms: no abdominal pain        Past Medical History:  Diagnosis Date  . Anemia   . Arthritis    past hx   . Blindness    right eye  . Cataract    removed both eyes  . Dehydration   . Diabetes ([place])   . Glaucoma   . History of CVA (cerebrovascular accident) 09/13/2015  . History of urinary retention   . Hyperlipidemia   . Hypertension   . Pernicious anemia 02/24/2018  . Stroke [facility])    2017- March  . Tachycardia 08/26/2017  . Tubular adenoma of colon 02/2017  . Weight loss, non-intentional 08/26/2017   10 lbs between 6/18 & 2/19    Patient Active Problem List   Diagnosis Date Noted  . Dyspnea 10/15/2019  . Elevated TSH 09/29/2019  . Pleural effusion 09/21/2019  . Fatigue 09/17/2019  . Hematuria, unspecified 08/13/2019  . Anasarca associated with disorder of kidney 08/05/2019  . Protein calorie malnutrition ([place]) 07/31/2019  . Left renal mass 07/21/2019  . CKD (chronic kidney disease) stage 4, GFR 15-29 ml/min (HCC) 04/23/2019  . Hip weakness 08/13/2018  . Pernicious anemia 02/24/2018  . Weight loss, non-intentional 08/26/2017  . 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 BPH with urinary obstruction   . Normocytic anemia 12/21/2016  . 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  Hyperlipidemia 09/13/2015  . Diabetes mellitus with retinopathy of both eyes ([place])   . Essential hypertension     Past Surgical History:  Procedure Laterality Date  . CATARACT EXTRACTION, BILATERAL    . COLONOSCOPY    . IR THORACENTESIS ASP PLEURAL SPACE W/IMG GUIDE  09/21/2019  . POLYPECTOMY    . REFRACTIVE SURGERY  10/2017  . TEE WITHOUT CARDIOVERSION N/A 09/14/2015   Procedure: TRANSESOPHAGEAL ECHOCARDIOGRAM (TEE);  Surgeon: Larey Dresser, MD;  Location: [facility];  Service: Cardiovascular;  Laterality: N/A;  . UPPER GASTROINTESTINAL ENDOSCOPY         Family History  Problem Relation Age of Onset  . Hypertension Mother   . Hyperlipidemia Mother   . Hyperlipidemia Father   . Colon cancer Neg Hx   . Colon polyps Neg Hx   . Esophageal cancer Neg Hx   . Rectal cancer Neg Hx   . Stomach cancer Neg Hx     Social History   Tobacco Use  . Smoking status: Former Research scientist (life sciences)  . Smokeless tobacco: Former Systems developer    Types: Chew    Quit date: 07/09/1978  . Tobacco comment: quit 1 year ago  Substance Use Topics  . Alcohol use: No    Alcohol/week: 1.0 standard drinks    Types: 1 Cans of beer per week    Comment: quit last march/2017  . Drug use: No    Home  Medications Prior to Admission medications   Medication Sig Start Date End Date Taking? Authorizing Provider  ACCU-CHEK FASTCLIX LANCETS MISC Check blood sugar up to 7 times a week as instructed Patient taking differently: 1 each by Other route See admin instructions. Check blood sugar up to 7 times a week as instructed 08/13/18  Yes Isabelle Course, MD  amLODipine (NORVASC) 10 MG tablet Take 1 tablet (10 mg total) by mouth daily. 07/22/19  Yes [name], Rylee, MD  atorvastatin (LIPITOR) 80 MG tablet Take 1 tablet (80 mg total) by mouth at bedtime. IM program 07/22/19  Yes [name], Rylee, MD  Blood Glucose Monitoring Suppl (ACCU-CHEK GUIDE) w/Device KIT 1 each by Does not apply route daily. Check blood sugar as instructed up to 7  times a week Patient taking differently: 1 each by Does not apply route See admin instructions. Check blood sugar as instructed up to 7 times a week 08/13/18  Yes Isabelle Course, MD  dorzolamide-timolol (COSOPT) 22.3-6.8 MG/ML ophthalmic solution Place 1 drop into the right eye 2 (two) times daily.  01/16/18  Yes [provider]  ferrous gluconate (FERGON) 324 MG tablet Take 324 mg by mouth daily with breakfast.   Yes [provider]  finasteride (PROSCAR) 5 MG tablet Take 5 mg by mouth daily. 08/12/19  Yes [provider]  furosemide (LASIX) 40 MG tablet TAKE 1 TABLET (40 MG TOTAL) BY MOUTH 2 (TWO) TIMES DAILY. 10/10/19 10/09/20 Yes [name], Rylee, MD  glucose blood (ACCU-CHEK GUIDE) test strip Check blood sugar up to 7 times a week as instructed Patient taking differently: 1 each by Other route See admin instructions. Check blood sugar up to 7 times a week as instructed 08/13/18  Yes Isabelle Course, MD  metoprolol tartrate (LOPRESSOR) 25 MG tablet Take 0.5 tablets (12.5 mg total) by mouth 2 (two) times daily. 07/15/19 10/15/19 Yes [name], Rylee, MD  sodium bicarbonate 650 MG tablet Take 650 mg by mouth 2 (two) times daily.   Yes [provider]  tamsulosin (FLOMAX) 0.4 MG CAPS capsule TAKE 1 CAPSULE (0.4 MG TOTAL) BY MOUTH DAILY AFTER SUPPER. 10/10/19  Yes [name], Rylee, MD  vitamin B-12 (CYANOCOBALAMIN) 1000 MCG tablet Take 1,000 mcg by mouth daily.   Yes [provider]    Allergies    Patient has no known allergies.  Review of Systems   Review of Systems  Respiratory: Positive for shortness of breath.   Gastrointestinal: Negative for abdominal pain.  All other systems reviewed and are negative.   Physical Exam Updated Vital Signs BP (!) 152/95 (BP Location: Left Arm)   Pulse 76   Temp 98.1 F (36.7 C) (Oral)   Resp 16   Ht 6' (1.829 m)   Wt 77.1 kg   SpO2 93%   BMI 23.06 kg/m   Physical Exam Vitals and nursing note reviewed.   Constitutional:      Appearance: He is well-developed.  HENT:     Head: Normocephalic and atraumatic.  Cardiovascular:     Rate and Rhythm: Normal rate.  Pulmonary:     Effort: Pulmonary effort is normal. No respiratory distress.     Breath sounds: Decreased breath sounds present.  Chest:     Chest wall: No mass or tenderness.  Abdominal:     General: There is no distension.     Palpations: Abdomen is soft.  Musculoskeletal:        General: Normal range of motion.     Cervical back:  Normal range of motion.  Skin:    General: Skin is warm and dry.  Neurological:     General: No focal deficit present.     Mental Status: He is alert.     ED Results / Procedures / Treatments   Labs (all labs ordered are listed, but only abnormal results are displayed) Labs Reviewed  BASIC METABOLIC PANEL - Abnormal; Notable for the following components:      Result Value   Glucose, Bld 151 (*)    BUN 47 (*)    Creatinine, Ser 3.27 (*)    GFR calc non Af Amer 18 (*)    GFR calc Af Amer 21 (*)    All other components within normal limits  CBC - Abnormal; Notable for the following components:   RBC 3.43 (*)    Hemoglobin 10.1 (*)    HCT 32.1 (*)    All other components within normal limits  BRAIN NATRIURETIC PEPTIDE - Abnormal; Notable for the following components:   B Natriuretic Peptide 1,820.0 (*)    All other components within normal limits  HEPATIC FUNCTION PANEL - Abnormal; Notable for the following components:   Total Protein 5.7 (*)    Albumin 2.8 (*)    Indirect Bilirubin 0.2 (*)    All other components within normal limits  PROTIME-INR - Abnormal; Notable for the following components:   Prothrombin Time 18.3 (*)    INR 1.5 (*)    All other components within normal limits  URINALYSIS, COMPLETE (UACMP) WITH MICROSCOPIC - Abnormal; Notable for the following components:   Hgb urine dipstick SMALL (*)    Protein, ur 100 (*)    Leukocytes,Ua SMALL (*)    Bacteria, UA RARE (*)     All other components within normal limits  PROTEIN / CREATININE RATIO, URINE - Abnormal; Notable for the following components:   Protein Creatinine Ratio 6.28 (*)    All other components within normal limits  COMPREHENSIVE METABOLIC PANEL - Abnormal; Notable for the following components:   Glucose, Bld 128 (*)    BUN 44 (*)    Creatinine, Ser 3.35 (*)    Total Protein 5.6 (*)    Albumin 2.7 (*)    AST 14 (*)    GFR calc non Af Amer 18 (*)    GFR calc Af Amer 21 (*)    All other components within normal limits  CBC - Abnormal; Notable for the following components:   RBC 3.09 (*)    Hemoglobin 9.2 (*)    HCT 28.6 (*)    All other components within normal limits  TROPONIN I (HIGH SENSITIVITY) - Abnormal; Notable for the following components:   Troponin I (High Sensitivity) 20 (*)    All other components within normal limits  TROPONIN I (HIGH SENSITIVITY) - Abnormal; Notable for the following components:   Troponin I (High Sensitivity) 19 (*)    All other components within normal limits  SARS CORONAVIRUS 2 (TAT 6-24 HRS)  MAGNESIUM  APTT    EKG None  Radiology DG Chest 2 View  Result Date: 10/14/2019 CLINICAL DATA:  Shortness of breath EXAM: CHEST - 2 VIEW COMPARISON:  09/21/2019 FINDINGS: Cardiomegaly. Bilateral pleural effusions, left greater than right. Vascular congestion. Bilateral lower lobe airspace opacities could reflect edema or pneumonia. IMPRESSION: Moderate left effusion and small right effusion. Bilateral lower lobe edema or pneumonia. Cardiomegaly, vascular congestion. Electronically Signed   By: Rolm Baptise M.D.   On: 10/14/2019 23:09  ECHOCARDIOGRAM COMPLETE  Result Date: 10/15/2019    ECHOCARDIOGRAM REPORT   Patient Name:   GERRARD CRYSTAL Date of Exam: 10/15/2019 Medical Rec #:  [id number]  Height:       72.0 in Accession #:    [id number] Weight:       170.0 lb Date of Birth:  01/02/1951  BSA:          1.988 m Patient Age:    31 years   BP:           138/87 mmHg  Patient Gender: M          HR:           66 bpm. Exam Location:  Inpatient Procedure: 2D Echo, Cardiac Doppler and Color Doppler Indications:    Dyspnea 786.09 / R06.00  History:        Patient has prior history of Echocardiogram examinations, most                 recent 09/14/2015. Stroke; Risk Factors:Hypertension, Diabetes and                 Dyslipidemia.  Sonographer:    Tiffany Dance Referring Phys: [id number] [place]  1. Left ventricular ejection fraction, by estimation, is 40 to 45%. The left ventricle has mildly decreased function. The left ventricle demonstrates regional wall motion abnormalities (see scoring diagram/findings for description). There is mild asymmetric left ventricular hypertrophy of the septal segment. Left ventricular diastolic parameters are consistent with Grade I diastolic dysfunction (impaired relaxation).  2. Right ventricular systolic function is normal. The right ventricular size is normal. There is mildly elevated pulmonary artery systolic pressure.  3. Left atrial size was severely dilated.  4. Moderate pleural effusion.  5. The mitral valve is normal in structure. Mild mitral valve regurgitation. No evidence of mitral stenosis.  6. The aortic valve is tricuspid. Aortic valve regurgitation is not visualized. Mild to moderate aortic valve sclerosis/calcification is present, without any evidence of aortic stenosis.  7. The inferior vena cava is normal in size with greater than 50% respiratory variability, suggesting right atrial pressure of 3 mmHg. Comparison(s): Changes from prior study are noted. The left ventricular function is worsened. FINDINGS  Left Ventricle: Left ventricular ejection fraction, by estimation, is 40 to 45%. The left ventricle has mildly decreased function. The left ventricle demonstrates regional wall motion abnormalities. The left ventricular internal cavity size was normal in size. There is mild asymmetric left ventricular hypertrophy of the  septal segment. Left ventricular diastolic parameters are consistent with Grade I diastolic dysfunction (impaired relaxation).  LV Wall Scoring: The entire apex is akinetic. Right Ventricle: The right ventricular size is normal. No increase in right ventricular wall thickness. Right ventricular systolic function is normal. There is mildly elevated pulmonary artery systolic pressure. The tricuspid regurgitant velocity is 2.95  m/s, and with an assumed right atrial pressure of 3 mmHg, the estimated right ventricular systolic pressure is 38.3 mmHg. Left Atrium: Left atrial size was severely dilated. Right Atrium: Right atrial size was normal in size. Pericardium: Trivial pericardial effusion is present. Mitral Valve: The mitral valve is normal in structure. Normal mobility of the mitral valve leaflets. Mild mitral valve regurgitation. No evidence of mitral valve stenosis. Tricuspid Valve: The tricuspid valve is normal in structure. Tricuspid valve regurgitation is not demonstrated. No evidence of tricuspid stenosis. Aortic Valve: The aortic valve is tricuspid. Aortic valve regurgitation is not visualized. Mild to moderate aortic valve sclerosis/calcification is present,  without any evidence of aortic stenosis. Pulmonic Valve: The pulmonic valve was normal in structure. Pulmonic valve regurgitation is not visualized. No evidence of pulmonic stenosis. Aorta: The aortic root is normal in size and structure. Venous: The inferior vena cava is normal in size with greater than 50% respiratory variability, suggesting right atrial pressure of 3 mmHg. IAS/Shunts: No atrial level shunt detected by color flow Doppler. Additional Comments: There is a moderate pleural effusion. Moderate ascites is present.  LEFT VENTRICLE PLAX 2D LVIDd:         4.80 cm  Diastology LVIDs:         3.30 cm  LV e' lateral:   5.10 cm/s LV PW:         1.10 cm  LV E/e' lateral: 18.3 LV IVS:        1.20 cm  LV e' medial:    4.20 cm/s LVOT diam:     2.20 cm   LV E/e' medial:  22.2 LV SV:         67 LV SV Index:   34 LVOT Area:     3.80 cm  RIGHT VENTRICLE             IVC RV Basal diam:  3.20 cm     IVC diam: 1.80 cm RV Mid diam:    2.20 cm RV S prime:     10.30 cm/s TAPSE (M-mode): 2.1 cm LEFT ATRIUM            Index       RIGHT ATRIUM           Index LA diam:      3.80 cm  1.91 cm/m  RA Area:     11.20 cm LA Vol (A2C): 103.0 ml 51.80 ml/m RA Volume:   21.60 ml  10.86 ml/m LA Vol (A4C): 101.0 ml 50.80 ml/m  AORTIC VALVE LVOT Vmax:   82.00 cm/s LVOT Vmean:  50.300 cm/s LVOT VTI:    0.177 m  AORTA Ao Root diam: 3.60 cm Ao Asc diam:  3.30 cm MITRAL VALVE               TRICUSPID VALVE MV Area (PHT): 4.39 cm    TR Peak grad:   34.8 mmHg MV Decel Time: 173 msec    TR Vmax:        295.00 cm/s MV E velocity: 93.10 cm/s MV A velocity: 83.90 cm/s  SHUNTS MV E/A ratio:  1.11        Systemic VTI:  0.18 m                            Systemic Diam: 2.20 cm Candee Furbish MD Electronically signed by Candee Furbish MD Signature Date/Time: 10/15/2019/5:06:01 PM    Final     Procedures Procedures (including critical care time)  Medications Ordered in ED Medications  sodium chloride flush (NS) 0.9 % injection 3 mL (3 mLs Intravenous Not Given 10/15/19 0500)  heparin injection 5,000 Units (5,000 Units Subcutaneous Given 10/15/19 2045)  acetaminophen (TYLENOL) tablet 650 mg (has no administration in time range)    Or  acetaminophen (TYLENOL) suppository 650 mg (has no administration in time range)  amLODipine (NORVASC) tablet 10 mg (10 mg Oral Given 10/15/19 1147)  atorvastatin (LIPITOR) tablet 80 mg (80 mg Oral Given 10/15/19 2044)  ferrous gluconate (FERGON) tablet 324 mg (324 mg Oral Given 10/15/19 1147)  finasteride (PROSCAR) tablet 5 mg (  5 mg Oral Given 10/15/19 1147)  tamsulosin (FLOMAX) capsule 0.4 mg (0.4 mg Oral Given 10/15/19 1706)  vitamin B-12 (CYANOCOBALAMIN) tablet 1,000 mcg (1,000 mcg Oral Given 10/15/19 1146)  metoprolol tartrate (LOPRESSOR) tablet 12.5 mg (12.5 mg Oral  Given 10/15/19 2044)  dorzolamide-timolol (COSOPT) 22.3-6.8 MG/ML ophthalmic solution 1 drop (1 drop Right Eye Given 10/15/19 2045)  furosemide (LASIX) injection 60 mg (60 mg Intravenous Given 10/15/19 1440)  Chlorhexidine Gluconate Cloth 2 % PADS 6 each (6 each Topical Given 10/15/19 1440)  furosemide (LASIX) injection 80 mg (80 mg Intravenous Given 10/15/19 0546)    ED Course  I have reviewed the triage vital signs and the nursing notes.  Pertinent labs & imaging results that were available during my care of the patient were reviewed by me and considered in my medical decision making (see chart for details).    MDM Rules/Calculators/A&P                      Recurrent effusion. Causing significant DOE. Will d/w medicine for readmit for further management. No indication for emergent drainage.   Final Clinical Impression(s) / ED Diagnoses Final diagnoses:  Dyspnea, unspecified type  Pleural effusion    Rx / DC Orders ED Discharge Orders    None       , Corene Cornea, MD 10/16/19 [phone]

## 2019-10-16 NOTE — H&P (View-Only) (Signed)
Patient ID: Dennis Macias, male   DOB: 06/09/1951, 69 y.o.   MRN: [id number]   I was notified of Mr. Yohe' admission for volume overload.   He is scheduled for a TURP on 4/13 for BPH with retention.  I have communicated with his primary team and Dr. Johnsie Cancel and will keep him on the schedule but cancel if his medical condition hasn't sufficiently improved by early next week.

## 2019-10-16 NOTE — Procedures (Signed)
PROCEDURE SUMMARY:  Successful image-guided left thoracentesis. Yielded 1.35 liters of clear gold fluid. Patient tolerated procedure well. No immediate complications. EBL = 0 mL.  Specimen was not sent for labs. CXR ordered.  Please see imaging section of Epic for full dictation.   Claris Pong Amillia Biffle PA-C 10/16/2019 4:04 PM

## 2019-10-16 NOTE — Evaluation (Signed)
Occupational Therapy Evaluation Patient Details Name: Dennis Macias MRN: [id number] DOB: Nov 20, 1950 Today's Date: 10/16/2019    History of Present Illness Pt admitted with SOB and recurrecnt pleural effusion. Pt d/c'd on 09/21/19 with B pleural effusions.  Has done well at home until last few days.  Pt iwth PMH of glaucoma, DM, blind in R eye, cataract, CKD, L renal mass, BPH with unrinary obsturction scheduled for TURP on 10/20/19 as outpatient.     Clinical Impression   Pt admitted with the above diagnosis and has the few deficits listed below. Pt is close to baseline with his basic adls but feels very SOB when performing them and could use further acute OT to increase knowledge and use of energy conservation skills for returning home.  Pt was in hospital 3 weeks ago but was fairly independent with assistive devices when d/c'd home.  Feel pt can return home safely has he has 24/7 assist at home. Will follow acutely. Pt has all necessary equipment.    Follow Up Recommendations  Supervision/Assistance - 24 hour;No OT follow up    Equipment Recommendations       Recommendations for Other Services       Precautions / Restrictions Precautions Precautions: Fall Precaution Comments: Pt does not report falls at home. Restrictions Weight Bearing Restrictions: No      Mobility Bed Mobility Overal bed mobility: Modified Independent             General bed mobility comments: Pt came to EOB with HOB at 40 degrees with no assist.  Transfers Overall transfer level: Needs assistance Equipment used: Rolling walker (2 wheeled) Transfers: Sit to/from [facility] Sit to Stand: Supervision Stand pivot transfers: Min guard       General transfer comment: cues for hand placement when sitting.  Pt did not reach back to surface and kept hands on walker.    Balance Overall balance assessment: Mild deficits observed, not formally tested                                         ADL either performed or assessed with clinical judgement   ADL Overall ADL's : Needs assistance/impaired Eating/Feeding: Set up;Sitting   Grooming: Min guard;Standing;Wash/dry face;Wash/dry hands   Upper Body Bathing: Set up;Sitting   Lower Body Bathing: Minimal assistance;Sit to/from stand;Cueing for safety   Upper Body Dressing : Set up;Sitting   Lower Body Dressing: Minimal assistance;Sit to/from stand;Cueing for safety Lower Body Dressing Details (indicate cue type and reason): min assist only to steady when standing Toilet Transfer: Min guard;Ambulation;Comfort height toilet;Grab bars;RW Armed forces technical officer Details (indicate cue type and reason): walked to bathroom Toileting- Water quality scientist and Hygiene: Min guard;Sit to/from stand       Functional mobility during ADLs: Minimal assistance;Cueing for safety General ADL Comments: Pt did well with adls.  Could use some tips for energy conservation as pt fatigues quickly.     Vision Baseline Vision/History: Wears glasses;Glaucoma;Cataracts Wears Glasses: Reading only Patient Visual Report: No change from baseline Vision Assessment?: Vision impaired- to be further tested in functional context Additional Comments: Pt's vision has been impaired for sometime now.  Feels it is at baseline.     Perception     Praxis Praxis Praxis tested?: Within functional limits    Pertinent Vitals/Pain Pain Assessment: No/denies pain     Hand Dominance Right   Extremity/Trunk Assessment Upper Extremity  Assessment Upper Extremity Assessment: Overall WFL for tasks assessed(swelling noted LUE more than RUE.)   Lower Extremity Assessment Lower Extremity Assessment: Defer to PT evaluation   Cervical / Trunk Assessment Cervical / Trunk Assessment: Normal   Communication Communication Communication: HOH   Cognition Arousal/Alertness: Awake/alert Behavior During Therapy: WFL for tasks assessed/performed Overall  Cognitive Status: Within Functional Limits for tasks assessed                                 General Comments: Pt states that his mobility feels close to baseline; pt reports more SOB.   General Comments  Pt with mild swelling noted in BUEs.    Exercises     Shoulder Instructions      Home Living Family/patient expects to be discharged to:: Private residence Living Arrangements: Spouse/significant other Available Help at Discharge: Family Type of Home: Apartment Home Access: Stairs to enter [employer] of Steps: 6 Entrance Stairs-Rails: Left Home Layout: One level;Able to live on main level with bedroom/bathroom     Bathroom Shower/Tub: Teacher, early years/pre: Handicapped height Bathroom Accessibility: Yes How Accessible: Accessible via walker Home Equipment: Bedside commode;Wheelchair - manual;Cane - Set designer - 2 wheels   Additional Comments: fiance works, his children live near by and can come help him PRN      Prior Functioning/Environment Level of Independence: Needs assistance  Gait / Transfers Assistance Needed: Normally walks with walker or cane.  Has been ambulating at home with AD with mod I since dc on 3/15. ADL's / Homemaking Assistance Needed: Independent with basic adls, sponge bathes since dc 3/15, girlfriend does all cooking and cleaning.            OT Problem List: Decreased activity tolerance;Impaired balance (sitting and/or standing);Decreased knowledge of use of DME or AE;Increased edema      OT Treatment/Interventions: Self-care/ADL training;Energy conservation;Therapeutic activities    OT Goals(Current goals can be found in the care plan section) Acute Rehab OT Goals Patient Stated Goal: to breathe easier OT Goal Formulation: With patient Time For Goal Achievement: 10/30/19 Potential to Achieve Goals: Good ADL Goals Pt Will Perform Grooming: with modified independence;standing Pt Will Perform Lower  Body Dressing: with modified independence;sit to/from stand Additional ADL Goal #1: Pt will walk to bathroom with walker and complete all toileting tasks on 3:1 over commode with mod I. Additional ADL Goal #2: Pt will state 3 things that can be done at home to increase efficiency with adls with no VCs.  OT Frequency: Min 2X/week   Barriers to D/C:    Pt has 24/7 assist       Co-evaluation              AM-PAC OT "6 Clicks" Daily Activity     Outcome Measure Help from another person eating meals?: A Little Help from another person taking care of personal grooming?: A Little Help from another person toileting, which includes using toliet, bedpan, or urinal?: A Little Help from another person bathing (including washing, rinsing, drying)?: A Little Help from another person to put on and taking off regular upper body clothing?: None Help from another person to put on and taking off regular lower body clothing?: A Little 6 Click Score: 19   End of Session Equipment Utilized During Treatment: Rolling walker Nurse Communication: Mobility status  Activity Tolerance: Patient limited by fatigue Patient left: in bed;with call bell/phone within reach;with bed  alarm set  OT Visit Diagnosis: Unsteadiness on feet (R26.81)                Time: 8406-9861 OT Time Calculation (min): 15 min Charges:  OT General Charges $OT Visit: 1 Visit OT Evaluation $OT Eval Low Complexity: 1 Low   Treshawn, Allen 10/16/2019, 9:11 AM

## 2019-10-16 NOTE — Progress Notes (Signed)
Patient ID: Dennis Macias, male   DOB: 09/11/1950, 69 y.o.   MRN: [id number]   I was notified of [name]' admission for volume overload.   He is scheduled for a TURP on 4/13 for BPH with retention.  I have communicated with his primary team and Dr. Johnsie Cancel and will keep him on the schedule but cancel if his medical condition hasn't sufficiently improved by early next week.

## 2019-10-16 NOTE — Progress Notes (Signed)
PT Progress Note for Charges    10/16/19 1400  PT Visit Information  Last PT Received On 10/16/19  PT General Charges  $$ ACUTE PT VISIT 1 Visit  PT Evaluation  $PT Eval Moderate Complexity 1 Mod  Eduard Clos, PT, DPT  Acute Rehabilitation Services Pager [phone] Office [phone]

## 2019-10-16 NOTE — Consult Note (Signed)
CARDIOLOGY CONSULT NOTE       Patient ID: Dennis Macias MRN: [id number] DOB/AGE: Apr 26, 1951 69 y.o.  Admit date: 10/14/2019 Referring Physician: Berline Lopes Resident Primary Physician: [name], MD Primary Cardiologist: Irish Lack Reason for Consultation: CHF  Active Problems:   BPH with urinary obstruction   CKD (chronic kidney disease) stage 4, GFR 15-29 ml/min (HCC)   Pleural effusion   Dyspnea   HPI:  69 y.o. with history of nephrotic syndrome, CRF baseline Cr around 3 HTN, DM, history of CVA in 2017  And BPH. Has had indwelling foley in with planned BPH surgery with Dr Jeffie Pollock 4/23 at [facility]. His functional status has declined over the last year Was d/c from [facility] 09/21/19 with anasarca and volume overload Rx iv diuresis and right thoracentesis. D/c on Lasix 40 mg bid. Now admitted with recurrent dyspnea and volume overload He indicates being compliant with meds. CXR with moderate left pleural effusion CE and vascular congestion BNP 1820.  Echo done 10/15/19 with EF 40-45% with akinetic apex and mild MR. He denies any SSCP. Has been weak with some LE edema and dyspnea. Troponin not remarkable 20-> 19.  ECG shows SR with first degree ICRBBB LAFB and lateral T wave changes He currently feels better after 2 doses of iv lasix   ROS All other systems reviewed and negative except as noted above  Past Medical History:  Diagnosis Date  . Anemia   . Arthritis    past hx   . Blindness    right eye  . Cataract    removed both eyes  . Dehydration   . Diabetes ([place])   . Glaucoma   . History of CVA (cerebrovascular accident) 09/13/2015  . History of urinary retention   . Hyperlipidemia   . Hypertension   . Pernicious anemia 02/24/2018  . Stroke [facility])    2017- March  . Tachycardia 08/26/2017  . Tubular adenoma of colon 02/2017  . Weight loss, non-intentional 08/26/2017   10 lbs between 6/18 & 2/19    Family History  Problem Relation Age of Onset  . Hypertension Mother   . Hyperlipidemia  Mother   . Hyperlipidemia Father   . Colon cancer Neg Hx   . Colon polyps Neg Hx   . Esophageal cancer Neg Hx   . Rectal cancer Neg Hx   . Stomach cancer Neg Hx     Social History   Socioeconomic History  . Marital status: Married    Spouse name: Not on file  . Number of children: Not on file  . Years of education: Not on file  . Highest education level: Not on file  Occupational History  . Not on file  Tobacco Use  . Smoking status: Former Research scientist (life sciences)  . Smokeless tobacco: Former Systems developer    Types: Chew    Quit date: 07/09/1978  . Tobacco comment: quit 1 year ago  Substance and Sexual Activity  . Alcohol use: No    Alcohol/week: 1.0 standard drinks    Types: 1 Cans of beer per week    Comment: quit last march/2017  . Drug use: No  . Sexual activity: Not on file  Other Topics Concern  . Not on file  Social History Narrative  . Not on file   Social Determinants of Health   Financial Resource Strain:   . Difficulty of Paying Living Expenses:   Food Insecurity:   . Worried About Charity fundraiser in the Last Year:   . [employer]  of Food in the Last Year:   Transportation Needs:   . Film/video editor (Medical):   Marland Kitchen Lack of Transportation (Non-Medical):   Physical Activity:   . Days of Exercise per Week:   . Minutes of Exercise per Session:   Stress:   . Feeling of Stress :   Social Connections:   . Frequency of Communication with Friends and Family:   . Frequency of Social Gatherings with Friends and Family:   . Attends Religious Services:   . Active Member of Clubs or Organizations:   . Attends Archivist Meetings:   Marland Kitchen Marital Status:   Intimate Partner Violence:   . Fear of Current or Ex-Partner:   . Emotionally Abused:   Marland Kitchen Physically Abused:   . Sexually Abused:     Past Surgical History:  Procedure Laterality Date  . CATARACT EXTRACTION, BILATERAL    . COLONOSCOPY    . IR THORACENTESIS ASP PLEURAL SPACE W/IMG GUIDE  09/21/2019  . POLYPECTOMY      . REFRACTIVE SURGERY  10/2017  . TEE WITHOUT CARDIOVERSION N/A 09/14/2015   Procedure: TRANSESOPHAGEAL ECHOCARDIOGRAM (TEE);  Surgeon: Larey Dresser, MD;  Location: [facility] ENDOSCOPY;  Service: Cardiovascular;  Laterality: N/A;  . UPPER GASTROINTESTINAL ENDOSCOPY        Current Facility-Administered Medications:  .  acetaminophen (TYLENOL) tablet 650 mg, 650 mg, Oral, Q6H PRN **OR** acetaminophen (TYLENOL) suppository 650 mg, 650 mg, Rectal, Q6H PRN, Kathi Ludwig, MD .  amLODipine (NORVASC) tablet 10 mg, 10 mg, Oral, Daily, Kathi Ludwig, MD, 10 mg at 10/16/19 0936 .  atorvastatin (LIPITOR) tablet 80 mg, 80 mg, Oral, QHS, Kathi Ludwig, MD, 80 mg at 10/15/19 2044 .  Chlorhexidine Gluconate Cloth 2 % PADS 6 each, 6 each, Topical, Daily, Velna Ochs, MD, 6 each at 10/16/19 [phone] .  dorzolamide-timolol (COSOPT) 22.3-6.8 MG/ML ophthalmic solution 1 drop, 1 drop, Right Eye, BID, Kathi Ludwig, MD, 1 drop at 10/16/19 0937 .  ferrous gluconate (FERGON) tablet 324 mg, 324 mg, Oral, Q breakfast, Kathi Ludwig, MD, 324 mg at 10/16/19 0936 .  finasteride (PROSCAR) tablet 5 mg, 5 mg, Oral, Daily, Harbrecht, Lawrence, MD, 5 mg at 10/16/19 0937 .  furosemide (LASIX) injection 60 mg, 60 mg, Intravenous, BID, Velna Ochs, MD, 60 mg at 10/16/19 1109 .  heparin injection 5,000 Units, 5,000 Units, Subcutaneous, Q8H, Kathi Ludwig, MD, 5,000 Units at 10/16/19 0544 .  metoprolol tartrate (LOPRESSOR) tablet 12.5 mg, 12.5 mg, Oral, BID, Kathi Ludwig, MD, 12.5 mg at 10/16/19 0936 .  sodium chloride flush (NS) 0.9 % injection 3 mL, 3 mL, Intravenous, Once, Mesner, Corene Cornea, MD .  tamsulosin (FLOMAX) capsule 0.4 mg, 0.4 mg, Oral, QPC supper, Kathi Ludwig, MD, 0.4 mg at 10/15/19 1706 .  vitamin B-12 (CYANOCOBALAMIN) tablet 1,000 mcg, 1,000 mcg, Oral, Daily, Kathi Ludwig, MD, 1,000 mcg at 10/16/19 0936 . amLODipine  10 mg Oral Daily  . atorvastatin  80 mg Oral  QHS  . Chlorhexidine Gluconate Cloth  6 each Topical Daily  . dorzolamide-timolol  1 drop Right Eye BID  . ferrous gluconate  324 mg Oral Q breakfast  . finasteride  5 mg Oral Daily  . furosemide  60 mg Intravenous BID  . heparin  5,000 Units Subcutaneous Q8H  . metoprolol tartrate  12.5 mg Oral BID  . sodium chloride flush  3 mL Intravenous Once  . tamsulosin  0.4 mg Oral QPC supper  . vitamin B-12  1,000 mcg Oral Daily  Physical Exam: BP (!) 162/92 (BP Location: Right Arm)   Pulse 81   Temp 98.7 F (37.1 C) (Oral)   Resp 18   Ht 6' (1.829 m)   Wt 77.1 kg   SpO2 98%   BMI 23.06 kg/m    Affect appropriate Chronically ill black male  HEENT: normal Neck supple with no adenopathy JVP normal no bruits no thyromegaly Lungs Decreased BS left base  Heart:  S1/S2 no murmur, no rub, gallop or click PMI normal Abdomen: benign indwelling foley  Distal pulses intact with no bruits Plus one bilateral  edema Neuro non-focal Skin warm and dry No muscular weakness   Labs:   Lab Results  Component Value Date   WBC 4.1 10/16/2019   HGB 9.2 (L) 10/16/2019   HCT 28.6 (L) 10/16/2019   MCV 92.6 10/16/2019   PLT 223 10/16/2019    Recent Labs  Lab 10/16/19 0107  NA 141  K 5.0  CL 108  CO2 24  BUN 44*  CREATININE 3.35*  CALCIUM 9.1  PROT 5.6*  BILITOT 0.6  ALKPHOS 55  ALT 11  AST 14*  GLUCOSE 128*   Lab Results  Component Value Date   CKTOTAL 67 02/21/2017   TROPONINI <0.03 07/27/2017    Lab Results  Component Value Date   CHOL 123 10/14/2019   CHOL 234 (H) 09/13/2015   CHOL 269 (H) 09/12/2015   Lab Results  Component Value Date   HDL 65 10/14/2019   HDL 49 09/13/2015   HDL 62 09/12/2015   Lab Results  Component Value Date   LDLCALC 49 10/14/2019   LDLCALC 148 (H) 09/13/2015   LDLCALC 178 (H) 09/12/2015   Lab Results  Component Value Date   TRIG 29 10/14/2019   TRIG 183 (H) 09/13/2015   TRIG 145 09/12/2015   Lab Results  Component Value  Date   CHOLHDL 1.9 10/14/2019   CHOLHDL 4.8 09/13/2015   CHOLHDL 4.3 09/12/2015   No results found for: LDLDIRECT    Radiology: CT ABDOMEN PELVIS WO CONTRAST  Result Date: 09/17/2019 CLINICAL DATA:  Lower abdominal pain. EXAM: CT ABDOMEN AND PELVIS WITHOUT CONTRAST TECHNIQUE: Multidetector CT imaging of the abdomen and pelvis was performed following the standard protocol without IV contrast. COMPARISON:  CT scan 07/31/2019 FINDINGS: Lower chest: Moderate-sized bilateral pleural effusions with moderate overlying atelectasis. The heart is normal in size. No pericardial effusion. Hepatobiliary: No hepatic lesions are identified without contrast. No intra or extrahepatic biliary dilatation. The gallbladder is grossly normal. Pancreas: No mass, inflammation or ductal dilatation. Spleen: Normal size. No focal lesions. Adrenals/Urinary Tract: The adrenal glands and kidneys are unremarkable. No renal, ureteral or bladder calculi or mass. There is air noted in the bladder. This may be due to recent instrumentation or catheterization. I do not see any findings suspicious for a colovesical fistula. Recommend clinical correlation. Stomach/Bowel: The stomach, duodenum and most of the small bowel appear normal. The distal and terminal ileum demonstrate wall thickening. The adjacent ascending colon also demonstrates wall thickening. No findings for obstruction. The appendix is normal. The remainder of the colon is unremarkable. No findings for diverticulosis or acute diverticulitis. Vascular/Lymphatic: Extensive vascular calcifications likely related to diabetes. No aneurysm. No abdominal or pelvic adenopathy. Reproductive: The prostate gland is mildly enlarged. The seminal vesicles appear normal. Other: Mild mesenteric edema, small amount of free pelvic fluid and diffuse body wall edema suggesting anasarca. Musculoskeletal: No acute bony findings. The pubic symphysis appears fused. Possible prior trauma. IMPRESSION:  1.  Moderate-sized bilateral pleural effusions with overlying atelectasis. 2. Wall thickening involving the distal and terminal ileum and adjacent ascending colon suggesting inflammatory or infectious process. I do not see any findings for obstruction or perforation. 3. Air in the bladder could be due to recent instrumentation or catheterization. I do not see any findings suspicious for a colovesical fistula. 4. Diffuse body wall edema and small amount of free pelvic fluid suggesting anasarca. 5. Extensive vascular calcifications likely related to diabetes. Electronically Signed   By: Marijo Sanes M.D.   On: 09/17/2019 10:56   DG Chest 1 View  Result Date: 09/21/2019 CLINICAL DATA:  Post right thoracentesis with removal of 800 mL fluid. EXAM: CHEST  1 VIEW COMPARISON:  March 14th 2021 FINDINGS: Decreased pleural fluid on the right with improved aeration of the medial right base. No pneumothorax. Stable mild cardiomegaly with central pulmonary vascular congestion, but no overt pulmonary edema. Persistent small left pleural effusion and left base dense consolidation with pleural fluid partially loculated along the left lateral chest wall to the level of the third rib lateral aspect. Aortic knob calcified atherosclerosis. Moderate skeletal degenerative change. Telemetry leads. IMPRESSION: Decreased right pleural fluid status post thoracentesis with no evidence of pneumothorax. Mild cardiomegaly and central pulmonary vascular congestion. Persistent small left partially loculated pleural effusion and dense basilar consolidation. Thoracic aortic calcified atherosclerosis. Electronically Signed   By: Revonda Humphrey   On: 09/21/2019 16:31   DG Chest 2 View  Result Date: 10/14/2019 CLINICAL DATA:  Shortness of breath EXAM: CHEST - 2 VIEW COMPARISON:  09/21/2019 FINDINGS: Cardiomegaly. Bilateral pleural effusions, left greater than right. Vascular congestion. Bilateral lower lobe airspace opacities could reflect edema or  pneumonia. IMPRESSION: Moderate left effusion and small right effusion. Bilateral lower lobe edema or pneumonia. Cardiomegaly, vascular congestion. Electronically Signed   By: Rolm Baptise M.D.   On: 10/14/2019 23:09   DG Chest 2 View  Result Date: 09/20/2019 CLINICAL DATA:  Hypertension, chronic kidney disease, abdominal pain pleural effusion EXAM: CHEST - 2 VIEW COMPARISON:  09/17/2018 FINDINGS: Similar moderate bilateral pleural effusions layering posteriorly, larger on the left. Associated bibasilar atelectasis/consolidation. Stable heart size and vascularity. No pneumothorax. Trachea midline. Aorta atherosclerotic. Degenerative changes of the spine. IMPRESSION: Similar pleural effusions and associated bibasilar atelectasis/consolidation worse on the left. Mild cardiomegaly Aortic atherosclerosis Electronically Signed   By: Jerilynn Mages.  Shick M.D.   On: 09/20/2019 12:15   DG Chest Port 1 View  Result Date: 09/17/2019 CLINICAL DATA:  Pleural effusion. EXAM: PORTABLE CHEST 1 VIEW COMPARISON:  Chest radiograph 07/31/2019. Lung bases from abdominal CT earlier this day. FINDINGS: Hazy opacities at the lung bases corresponding to pleural effusions and atelectasis as seen on CT. Mild cardiomegaly with unchanged mediastinal contours. Aortic atherosclerosis. Mild vascular congestion without edema. No pneumothorax. No acute osseous abnormalities are seen. IMPRESSION: 1. Hazy opacities at the lung bases corresponding to pleural effusions and atelectasis as seen on CT. 2. Mild cardiomegaly. Vascular congestion without edema. Aortic Atherosclerosis (ICD10-I70.0). Electronically Signed   By: Keith Rake M.D.   On: 09/17/2019 13:48   ECHOCARDIOGRAM COMPLETE  Result Date: 10/15/2019    ECHOCARDIOGRAM REPORT   Patient Name:   Dennis Macias Date of Exam: 10/15/2019 Medical Rec #:  [id number]  Height:       72.0 in Accession #:    [id number] Weight:       170.0 lb Date of Birth:  1950-07-26  BSA:          1.988  m Patient Age:     24 years   BP:           138/87 mmHg Patient Gender: M          HR:           66 bpm. Exam Location:  Inpatient Procedure: 2D Echo, Cardiac Doppler and Color Doppler Indications:    Dyspnea 786.09 / R06.00  History:        Patient has prior history of Echocardiogram examinations, most                 recent 09/14/2015. Stroke; Risk Factors:Hypertension, Diabetes and                 Dyslipidemia.  Sonographer:    Tiffany Dance Referring Phys: [id number] [place]  1. Left ventricular ejection fraction, by estimation, is 40 to 45%. The left ventricle has mildly decreased function. The left ventricle demonstrates regional wall motion abnormalities (see scoring diagram/findings for description). There is mild asymmetric left ventricular hypertrophy of the septal segment. Left ventricular diastolic parameters are consistent with Grade I diastolic dysfunction (impaired relaxation).  2. Right ventricular systolic function is normal. The right ventricular size is normal. There is mildly elevated pulmonary artery systolic pressure.  3. Left atrial size was severely dilated.  4. Moderate pleural effusion.  5. The mitral valve is normal in structure. Mild mitral valve regurgitation. No evidence of mitral stenosis.  6. The aortic valve is tricuspid. Aortic valve regurgitation is not visualized. Mild to moderate aortic valve sclerosis/calcification is present, without any evidence of aortic stenosis.  7. The inferior vena cava is normal in size with greater than 50% respiratory variability, suggesting right atrial pressure of 3 mmHg. Comparison(s): Changes from prior study are noted. The left ventricular function is worsened. FINDINGS  Left Ventricle: Left ventricular ejection fraction, by estimation, is 40 to 45%. The left ventricle has mildly decreased function. The left ventricle demonstrates regional wall motion abnormalities. The left ventricular internal cavity size was normal in size. There is mild  asymmetric left ventricular hypertrophy of the septal segment. Left ventricular diastolic parameters are consistent with Grade I diastolic dysfunction (impaired relaxation).  LV Wall Scoring: The entire apex is akinetic. Right Ventricle: The right ventricular size is normal. No increase in right ventricular wall thickness. Right ventricular systolic function is normal. There is mildly elevated pulmonary artery systolic pressure. The tricuspid regurgitant velocity is 2.95  m/s, and with an assumed right atrial pressure of 3 mmHg, the estimated right ventricular systolic pressure is 50.5 mmHg. Left Atrium: Left atrial size was severely dilated. Right Atrium: Right atrial size was normal in size. Pericardium: Trivial pericardial effusion is present. Mitral Valve: The mitral valve is normal in structure. Normal mobility of the mitral valve leaflets. Mild mitral valve regurgitation. No evidence of mitral valve stenosis. Tricuspid Valve: The tricuspid valve is normal in structure. Tricuspid valve regurgitation is not demonstrated. No evidence of tricuspid stenosis. Aortic Valve: The aortic valve is tricuspid. Aortic valve regurgitation is not visualized. Mild to moderate aortic valve sclerosis/calcification is present, without any evidence of aortic stenosis. Pulmonic Valve: The pulmonic valve was normal in structure. Pulmonic valve regurgitation is not visualized. No evidence of pulmonic stenosis. Aorta: The aortic root is normal in size and structure. Venous: The inferior vena cava is normal in size with greater than 50% respiratory variability, suggesting right atrial pressure of 3 mmHg. IAS/Shunts: No atrial level shunt detected by color flow Doppler. Additional Comments:  There is a moderate pleural effusion. Moderate ascites is present.  LEFT VENTRICLE PLAX 2D LVIDd:         4.80 cm  Diastology LVIDs:         3.30 cm  LV e' lateral:   5.10 cm/s LV PW:         1.10 cm  LV E/e' lateral: 18.3 LV IVS:        1.20 cm  LV  e' medial:    4.20 cm/s LVOT diam:     2.20 cm  LV E/e' medial:  22.2 LV SV:         67 LV SV Index:   34 LVOT Area:     3.80 cm  RIGHT VENTRICLE             IVC RV Basal diam:  3.20 cm     IVC diam: 1.80 cm RV Mid diam:    2.20 cm RV S prime:     10.30 cm/s TAPSE (M-mode): 2.1 cm LEFT ATRIUM            Index       RIGHT ATRIUM           Index LA diam:      3.80 cm  1.91 cm/m  RA Area:     11.20 cm LA Vol (A2C): 103.0 ml 51.80 ml/m RA Volume:   21.60 ml  10.86 ml/m LA Vol (A4C): 101.0 ml 50.80 ml/m  AORTIC VALVE LVOT Vmax:   82.00 cm/s LVOT Vmean:  50.300 cm/s LVOT VTI:    0.177 m  AORTA Ao Root diam: 3.60 cm Ao Asc diam:  3.30 cm MITRAL VALVE               TRICUSPID VALVE MV Area (PHT): 4.39 cm    TR Peak grad:   34.8 mmHg MV Decel Time: 173 msec    TR Vmax:        295.00 cm/s MV E velocity: 93.10 cm/s MV A velocity: 83.90 cm/s  SHUNTS MV E/A ratio:  1.11        Systemic VTI:  0.18 m                            Systemic Diam: 2.20 cm Candee Furbish MD Electronically signed by Candee Furbish MD Signature Date/Time: 10/15/2019/5:06:01 PM    Final    IR THORACENTESIS ASP PLEURAL SPACE W/IMG GUIDE  Result Date: 09/21/2019 INDICATION: Patient with history of chronic kidney disease and anasarca, now with bilateral pleural effusion. Request is made for diagnostic and therapeutic right thoracentesis. EXAM: ULTRASOUND GUIDED RIGHT THORACENTESIS MEDICATIONS: 10 mL 1% lidocaine COMPLICATIONS: None immediate. PROCEDURE: An ultrasound guided thoracentesis was thoroughly discussed with the patient and questions answered. The benefits, risks, alternatives and complications were also discussed. The patient understands and wishes to proceed with the procedure. Written consent was obtained. Ultrasound was performed to localize and mark an adequate pocket of fluid in the right chest. The area was then prepped and draped in the normal sterile fashion. 1% Lidocaine was used for local anesthesia. Under ultrasound guidance a 6 Fr  Safe-T-Centesis catheter was introduced. Thoracentesis was performed. The catheter was removed and a dressing applied. FINDINGS: A total of approximately 800 mL of yellow fluid was removed. Samples were sent to the laboratory as requested by the clinical team. IMPRESSION: Successful ultrasound guided diagnostic and therapeutic right thoracentesis yielding 800 mL of pleural fluid. Read  by: Brynda Greathouse PA-C Electronically Signed   By: Lucrezia Europe M.D.   On: 09/21/2019 16:12    EKG: See HPI    ASSESSMENT AND PLAN:   1. CHF:  Most likely due to CRF with Cr 3 and nephrotic syndrome Will need higher dose of lasix on dc as 40 bid not helping TTE with decreased EF and apical akinesis new since TTE done 2017. ECG is abnormal He denies SSCP and troponin negative. Would consider left thoracentesis. Place on 80 mg iv lasix bid Consult nephrology D/c norvasc continue lopressor and add hydralazine/ nitrates for decreased EF in setting of chornic stage 4 renal failure   2. BPH:  Will need to be diuresed before considering surgery Given DM/HTN , abnormal ECG and new RWMA on echo will order lexiscan myovue prior to planned surgery on 10/20/19 for BPH Would have to be high risk to consider cath given degree of renal failure   Signed: Jenkins Rouge 10/16/2019, 12:14 PM

## 2019-10-16 NOTE — TOC Initial Note (Addendum)
Transition of Care [facility]) - Initial/Assessment Note    Patient Details  Name: Dennis Macias MRN: [id number] Date of Birth: Dec 29, 1950  Transition of Care [facility]) CM/SW Contact:    Marilu Favre, RN Phone Number: 10/16/2019, 12:47 PM  Clinical Narrative:                  Patient from home with wife. Recent discharge from hospital with [facility] home health. Patient wants to continue with [facility]. Called Cory with [name] to confirm what orders are needed. Patient has wheel chair, walker and cane   Patient active with [facility] for [place]. Paged Dr. Marva Panda Pager: [phone] and asked  for orders for HHPT . Tommi Rumps with aware  Expected Discharge Plan: [place] Barriers to Discharge: Continued Medical Work up   Patient Goals and CMS Choice Patient states their goals for this hospitalization and ongoing recovery are:: to return to home CMS Medicare.gov Compare Post Acute Care list provided to:: Patient Choice offered to / list presented to : Patient  Expected Discharge Plan and Services Expected Discharge Plan: [place]   Discharge Planning Services: CM Consult Post Acute Care Choice: Durable Medical Equipment, Home Health Living arrangements for the past 2 months: Single Family Home                 DME Arranged: N/A                    Prior Living Arrangements/Services Living arrangements for the past 2 months: Single Family Home Lives with:: Spouse Patient language and need for interpreter reviewed:: Yes        Need for Family Participation in Patient Care: Yes (Comment) Care giver support system in place?: Yes (comment)   Criminal Activity/Legal Involvement Pertinent to Current Situation/Hospitalization: No - Comment as needed  Activities of Daily Living Home Assistive Devices/Equipment: Environmental consultant (specify type), Cane (specify quad or straight) ADL Screening (condition at time of admission) Patient's cognitive ability adequate to safely  complete daily activities?: Yes Is the patient deaf or have difficulty hearing?: No Does the patient have difficulty seeing, even when wearing glasses/contacts?: Yes Does the patient have difficulty concentrating, remembering, or making decisions?: No Patient able to express need for assistance with ADLs?: Yes Does the patient have difficulty dressing or bathing?: No Independently performs ADLs?: Yes (appropriate for developmental age) Does the patient have difficulty walking or climbing stairs?: No Weakness of Legs: Both Weakness of Arms/Hands: None  Permission Sought/Granted   Permission granted to share information with : No              Emotional Assessment Appearance:: Appears stated age Attitude/Demeanor/Rapport: Engaged Affect (typically observed): Accepting Orientation: : Oriented to Self, Oriented to Place, Oriented to  Time, Oriented to Situation Alcohol / Substance Use: Not Applicable Psych Involvement: No (comment)  Admission diagnosis:  Dyspnea [R06.00] Pleural effusion [J90] Dyspnea, unspecified type [R06.00] Patient Active Problem List   Diagnosis Date Noted  . Dyspnea 10/15/2019  . Elevated TSH 09/29/2019  . Pleural effusion 09/21/2019  . Fatigue 09/17/2019  . Hematuria, unspecified 08/13/2019  . Anasarca associated with disorder of kidney 08/05/2019  . Protein calorie malnutrition ([place]) 07/31/2019  . Left renal mass 07/21/2019  . CKD (chronic kidney disease) stage 4, GFR 15-29 ml/min (HCC) 04/23/2019  . Hip weakness 08/13/2018  . Pernicious anemia 02/24/2018  . Weight loss, non-intentional 08/26/2017  . 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 BPH with urinary obstruction   .  Normocytic anemia 12/21/2016  . 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 Hyperlipidemia 09/13/2015  . Diabetes mellitus with retinopathy of both eyes ([place])   . Essential hypertension    PCP:  [name], MD Pharmacy:   [facility], [place]. [address] [place] [address] Phone: [phone] Fax: [phone]     Social Determinants of Health (SDOH) Interventions    Readmission Risk Interventions Readmission Risk Prevention Plan 08/10/2019  Transportation Screening Complete  PCP or Specialist Appt within 3-5 Days Complete  HRI or [place] Complete  Social Work Consult for [place] Planning/Counseling Complete  Palliative Care Screening Complete  Medication Review Press photographer) Complete  Some recent data might be hidden

## 2019-10-16 NOTE — Evaluation (Signed)
Physical Therapy Evaluation Patient Details Name: Dennis Macias MRN: [id number] DOB: 1951-03-24 Today's Date: 10/16/2019   History of Present Illness  Pt admitted with SOB and recurrecnt pleural effusion. Pt d/c'd on 09/21/19 with B pleural effusions.  Has done well at home until last few days.  Pt iwth PMH of glaucoma, DM, blind in R eye, cataract, CKD, L renal mass, BPH with unrinary obsturction scheduled for TURP on 10/20/19 as outpatient.      Clinical Impression  Patient presented sitting in bed, awake, and willing to participate in therapy. PTA, pt was modified independent with mobility w/RW. Pt lives with his girlfriend in an apartment with 6 steps to enter. At the time of evaluation, pt was mod I for bed mobilityw/HOB elevated. He was able to stand from EOB with proper safety and sequencing with RW and supervision for safety. He ambulated ~100' with RW and min guard for safety. VC for inc hip extension and trunk upright during gait. VSS throughout, no LOB or SOB noted. Pt tolerated mobility and exercises well. Educated pt on importance of bed-level exercises throughout the day. Pt verbalized and demonsrtated understanding. Recommend d/c home with family support and resume HH PT services. Pt would continue to benefit from skilled physical therapy services at this time while admitted and after d/c to address the below listed limitations in order to improve overall safety and independence with functional mobility.     Follow Up Recommendations Home health PT;Other (comment)(Has been receiving HHPT PTA)    Equipment Recommendations  None recommended by PT;Other (comment)(pt has equipment)    Recommendations for Other Services       Precautions / Restrictions Precautions Precautions: Fall Precaution Comments: Pt does not report falls at home. Restrictions Weight Bearing Restrictions: No      Mobility  Bed Mobility Overal bed mobility: Modified Independent             General bed  mobility comments: No physical assistance needed. HOB elevated, inc time  Transfers Overall transfer level: Needs assistance Equipment used: Rolling walker (2 wheeled) Transfers: Sit to/from Stand Sit to Stand: Supervision         General transfer comment: Pt demo proper sequencing with RW. Supervision for safety  Ambulation/Gait Ambulation/Gait assistance: Min guard Gait Distance (Feet): 100 Feet Assistive device: Rolling walker (2 wheeled) Gait Pattern/deviations: Step-through pattern;Decreased stride length;Trunk flexed Gait velocity: dec   General Gait Details: Slow, steady gait  w/RW. Occasional difficulties due to sock catching on bottom of RW. No physical assist required, No LOB noted. Min guard for safety. 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    Modified Rankin (Stroke Patients Only)       Balance Overall balance assessment: Mild deficits observed, not formally tested                                           Pertinent Vitals/Pain Pain Assessment: No/denies pain    Home Living Family/patient expects to be discharged to:: Private residence Living Arrangements: Spouse/significant other Available Help at Discharge: Family Type of Home: Apartment Home Access: Stairs to enter Entrance Stairs-Rails: Left Entrance Stairs-Number of Steps: 6 Home Layout: One level;Able to live on main level with bedroom/bathroom Home Equipment: Bedside commode;Wheelchair - manual;Cane - quad;Walker - 2 wheels Additional Comments: girlfriend  works, his children live near by and can come help him PRN    Prior Function Level of Independence: Needs assistance   Gait / Transfers Assistance Needed: Normally walks with walker or cane.  Has been ambulating at home with AD with mod I since dc on 3/15.           Hand Dominance   Dominant Hand: Right    Extremity/Trunk Assessment   Upper Extremity  Assessment Upper Extremity Assessment: Overall WFL for tasks assessed;Defer to OT evaluation    Lower Extremity Assessment Lower Extremity Assessment: Overall WFL for tasks assessed       Communication   Communication: HOH  Cognition Arousal/Alertness: Awake/alert Behavior During Therapy: WFL for tasks assessed/performed Overall Cognitive Status: Within Functional Limits for tasks assessed                                 General Comments: Pt states he feels close to baseline      General Comments General comments (skin integrity, edema, etc.): VSS throughout. No audible or reported SOB    Exercises General Exercises - Lower Extremity Ankle Circles/Pumps: AROM;Both;5 reps;Supine Quad Sets: Strengthening;Both;5 reps;Supine Gluteal Sets: Strengthening;Both;5 reps;Supine Straight Leg Raises: Strengthening;Both;5 reps;Supine   Assessment/Plan    PT Assessment Patient needs continued PT services  PT Problem List Decreased strength;Decreased activity tolerance;Decreased balance;Decreased mobility;Decreased coordination       PT Treatment Interventions DME instruction;Gait training;Stair training;Functional mobility training;Therapeutic activities;Therapeutic exercise;Balance training    PT Goals (Current goals can be found in the Care Plan section)  Acute Rehab PT Goals Patient Stated Goal: to move better PT Goal Formulation: With patient Time For Goal Achievement: 10/30/19 Potential to Achieve Goals: Good    Frequency Min 3X/week   Barriers to discharge        Co-evaluation               AM-PAC PT "6 Clicks" Mobility  Outcome Measure Help needed turning from your back to your side while in a flat bed without using bedrails?: None Help needed moving from lying on your back to sitting on the side of a flat bed without using bedrails?: None Help needed moving to and from a bed to a chair (including a wheelchair)?: A Little Help needed standing up  from a chair using your arms (e.g., wheelchair or bedside chair)?: None Help needed to walk in hospital room?: A Little Help needed climbing 3-5 steps with a railing? : A Lot 6 Click Score: 20    End of Session Equipment Utilized During Treatment: Gait belt Activity Tolerance: Patient tolerated treatment well Patient left: in bed;with call bell/phone within reach;with bed alarm set   PT Visit Diagnosis: Unsteadiness on feet (R26.81);Other abnormalities of gait and mobility (R26.89)    Time: 1142-1200 PT Time Calculation (min) (ACUTE ONLY): 18 min   Charges:   PT Evaluation $PT Eval Moderate Complexity: 1 Mod          Irisa Grimsley, SPT Acute Rehab  [phone]   Iley Deignan 10/16/2019, 3:17 PM

## 2019-10-16 NOTE — Progress Notes (Signed)
Subjective: HD#1 Overnight, no acute events reported.  This morning patient evaluated at bedside. He notes improvement in breathing since admission. He denies any episodes of chest pain or shortness of breath within the past month. He notes that at baseline, he is mostly sedentary and is unable to walk outside of his house due to trouble with stairs. This does not appear to be an acute change.   Objective:  Vital signs in last 24 hours: Vitals:   10/15/19 1806 10/15/19 2042 10/15/19 2351 10/16/19 0645  BP: (!) 156/92 (!) 156/86 (!) 152/95 (!) 157/95  Pulse: 74 75 76 84  Resp: 14 16 16 16   Temp: 98.6 F (37 C) 97.9 F (36.6 C) 98.1 F (36.7 C) 98.8 F (37.1 C)  TempSrc: Oral Oral Oral Oral  SpO2: 98% 97% 93% 91%  Weight:      Height:       CBC Latest Ref Rng & Units 10/16/2019 10/14/2019 10/13/2019  WBC 4.0 - 10.5 K/uL 4.1 5.8 5.2  Hemoglobin 13.0 - 17.0 g/dL 9.2(L) 10.1(L) 9.5(L)  Hematocrit 39.0 - 52.0 % 28.6(L) 32.1(L) 29.7(L)  Platelets 150 - 400 K/uL 223 265 258   CMP Latest Ref Rng & Units 10/16/2019 10/15/2019 10/14/2019  Glucose 70 - 99 mg/dL 128(H) - 151(H)  BUN 8 - 23 mg/dL 44(H) - 47(H)  Creatinine 0.61 - 1.24 mg/dL 3.35(H) - 3.27(H)  Sodium 135 - 145 mmol/L 141 - 140  Potassium 3.5 - 5.1 mmol/L 5.0 - 5.0  Chloride 98 - 111 mmol/L 108 - 106  CO2 22 - 32 mmol/L 24 - 23  Calcium 8.9 - 10.3 mg/dL 9.1 - 9.2  Total Protein 6.5 - 8.1 g/dL 5.6(L) 5.7(L) -  Total Bilirubin 0.3 - 1.2 mg/dL 0.6 0.3 -  Alkaline Phos 38 - 126 U/L 55 51 -  AST 15 - 41 U/L 14(L) 15 -  ALT 0 - 44 U/L 11 13 -    Intake/Output Summary (Last 24 hours) at 10/16/2019 0651 Last data filed at 10/16/2019 0960 Gross per 24 hour  Intake 480 ml  Output 2050 ml  Net -1570 ml   Physical Exam  Constitutional: He is oriented to person, place, and time. Vital signs are normal. He appears malnourished. He appears unhealthy. He does not have a sickly appearance. No distress.  HENT:  Head: Normocephalic and  atraumatic.  Cardiovascular: Normal rate, regular rhythm, normal heart sounds and intact distal pulses.  Pulmonary/Chest: Effort normal. No respiratory distress. He has no wheezes.  Decreased breath sounds at bilateral bases   Abdominal: Soft. Bowel sounds are normal. He exhibits no distension. There is no abdominal tenderness.  Musculoskeletal:        General: Edema present. No tenderness or deformity. Normal range of motion.     Comments: 1+ pitting edema of bilateral lower extremities to mid-tibia  Neurological: He is alert and oriented to person, place, and time.  Skin: Skin is warm and dry. No rash noted. He is not diaphoretic. No erythema.    Assessment/Plan:  Dennis Macias is a 69yr old male with PMHx of CKD-IV with nephrotic syndrome, hypertension, type 2 diabetes mellitus, BPH w/obstruction and indwelling foley catheter, hematuria and anemia presenting with shortness of breath in setting of bilateral pleural effusions.  Bilateral pleural effusions: HFrEF exacerbation:  Patient presented with few days of worsening dyspnea with bilateral pleural effusions on chest x-ray.  He also had an inappropriately elevated BNP for which Echo was obtained.  Showed decreased EF 40  to 45% and apical akinesis that is new since the prior echo in 2017.  Patient received dose of IV Lasix 80 mg and one dose of IV Lasix 60 mg yesterday.  He has had -2 L urine output and is net -1.9 L since admission.  He is saturating well on room air and does not have any dyspnea currently.  Examination, he has decreased breath sounds at bilateral bases, L >R.  No chest pain however does endorse shortness of breath. Given this new apical akinesis and decrease in ejection fraction, will consult cardiology for further recommendation given upcoming TURP procedure.  Revised cardiac index of 3 which correlates with 15% 30-day risk of MI, stroke and cardiac arrest. - Cardiology consulted, appreciate their recommendations - Continue  IV Lasix 60 mg twice daily - Strict I&O - Continuous cardiac monitoring - Daily weights  -BMP daily  CKD IV with nephrotic syndrome: Patient with history of nephrotic syndrome, serum creatinine is stable today however, protein creatinine ratio is elevated on this admission. -IV Lasix as above -Monitor renal function -Avoid nephrotoxic agents  BPH w/indwelling foley:  Patient has indwelling foley for obstructive BPH. He has TURP scheduled on 4/13 with Dr. Roni Macias.  - Continue tamsulosin and finasteride - Continue foley catheter   FEN/GI: Diet: Renal/CM Electrolytes: Monitor and replete prn Fluids: None  DVT Prophylaxis: Heparin Code status: FULL code    Prior to Admission Living Arrangement: Home Anticipated Discharge Location: Home w/HH Barriers to Discharge: Ongoing medical evaluation and treatment  Dispo: Anticipated discharge in approximately 2-3 day(s).   Dennis Heck, MD  Internal Medicine, PGY-1 10/16/2019, 6:51 AM Pager: [phone]

## 2019-10-17 ENCOUNTER — Inpatient Hospital Stay (HOSPITAL_COMMUNITY): Payer: Medicare PPO

## 2019-10-17 DIAGNOSIS — N049 Nephrotic syndrome with unspecified morphologic changes: Secondary | ICD-10-CM

## 2019-10-17 DIAGNOSIS — R0603 Acute respiratory distress: Secondary | ICD-10-CM

## 2019-10-17 DIAGNOSIS — N138 Other obstructive and reflux uropathy: Secondary | ICD-10-CM

## 2019-10-17 DIAGNOSIS — R9431 Abnormal electrocardiogram [ECG] [EKG]: Secondary | ICD-10-CM

## 2019-10-17 DIAGNOSIS — E46 Unspecified protein-calorie malnutrition: Secondary | ICD-10-CM

## 2019-10-17 DIAGNOSIS — I130 Hypertensive heart and chronic kidney disease with heart failure and stage 1 through stage 4 chronic kidney disease, or unspecified chronic kidney disease: Principal | ICD-10-CM

## 2019-10-17 DIAGNOSIS — N401 Enlarged prostate with lower urinary tract symptoms: Secondary | ICD-10-CM

## 2019-10-17 LAB — CBC
HCT: 26.2 % — ABNORMAL LOW (ref 39.0–52.0)
Hemoglobin: 8.6 g/dL — ABNORMAL LOW (ref 13.0–17.0)
MCH: 30 pg (ref 26.0–34.0)
MCHC: 32.8 g/dL (ref 30.0–36.0)
MCV: 91.3 fL (ref 80.0–100.0)
Platelets: 207 10*3/uL (ref 150–400)
RBC: 2.87 MIL/uL — ABNORMAL LOW (ref 4.22–5.81)
RDW: 13.2 % (ref 11.5–15.5)
WBC: 4.8 10*3/uL (ref 4.0–10.5)
nRBC: 0 % (ref 0.0–0.2)

## 2019-10-17 LAB — COMPREHENSIVE METABOLIC PANEL
ALT: 11 U/L (ref 0–44)
ALT: 11 U/L (ref 0–44)
AST: 12 U/L — ABNORMAL LOW (ref 15–41)
AST: 13 U/L — ABNORMAL LOW (ref 15–41)
Albumin: 2.4 g/dL — ABNORMAL LOW (ref 3.5–5.0)
Albumin: 2.4 g/dL — ABNORMAL LOW (ref 3.5–5.0)
Alkaline Phosphatase: 46 U/L (ref 38–126)
Alkaline Phosphatase: 46 U/L (ref 38–126)
Anion gap: 10 (ref 5–15)
Anion gap: 7 (ref 5–15)
BUN: 45 mg/dL — ABNORMAL HIGH (ref 8–23)
BUN: 44 mg/dL — ABNORMAL HIGH (ref 8–23)
CO2: 22 mmol/L (ref 22–32)
CO2: 25 mmol/L (ref 22–32)
Calcium: 8.4 mg/dL — ABNORMAL LOW (ref 8.9–10.3)
Calcium: 8.5 mg/dL — ABNORMAL LOW (ref 8.9–10.3)
Chloride: 108 mmol/L (ref 98–111)
Chloride: 108 mmol/L (ref 98–111)
Creatinine, Ser: 3.66 mg/dL — ABNORMAL HIGH (ref 0.61–1.24)
Creatinine, Ser: 3.56 mg/dL — ABNORMAL HIGH (ref 0.61–1.24)
GFR calc Af Amer: 19 mL/min — ABNORMAL LOW (ref >60)
GFR calc Af Amer: 19 mL/min — ABNORMAL LOW (ref >60)
GFR calc non Af Amer: 16 mL/min — ABNORMAL LOW (ref >60)
GFR calc non Af Amer: 17 mL/min — ABNORMAL LOW (ref >60)
Glucose, Bld: 97 mg/dL (ref 70–99)
Glucose, Bld: 180 mg/dL — ABNORMAL HIGH (ref 70–99)
Potassium: 4.3 mmol/L (ref 3.5–5.1)
Potassium: 4.2 mmol/L (ref 3.5–5.1)
Sodium: 140 mmol/L (ref 135–145)
Sodium: 140 mmol/L (ref 135–145)
Total Bilirubin: 0.6 mg/dL (ref 0.3–1.2)
Total Bilirubin: 0.2 mg/dL — ABNORMAL LOW (ref 0.3–1.2)
Total Protein: 5.1 g/dL — ABNORMAL LOW (ref 6.5–8.1)
Total Protein: 5.3 g/dL — ABNORMAL LOW (ref 6.5–8.1)

## 2019-10-17 LAB — NM MYOCAR MULTI W/SPECT W/WALL MOTION / EF
Estimated workload: 1 METS
Exercise duration (min): 5 min
Exercise duration (sec): 16 s
MPHR: 152 {beats}/min
Peak HR: 88 {beats}/min
Percent HR: 57 %
Rest HR: 76 {beats}/min

## 2019-10-17 MED ORDER — REGADENOSON 0.4 MG/5ML IV SOLN
INTRAVENOUS | Status: AC
  Administered 2019-10-17: 0.4 mg via INTRAVENOUS
  Filled 2019-10-17: qty 5

## 2019-10-17 MED ORDER — METOPROLOL SUCCINATE ER 50 MG PO TB24
50.0000 mg | ORAL_TABLET | Freq: Every day | ORAL | Status: DC
  Administered 2019-10-17 – 2019-10-22 (×6): 50 mg via ORAL
  Filled 2019-10-17 (×6): qty 1

## 2019-10-17 MED ORDER — TECHNETIUM TC 99M TETROFOSMIN IV KIT: 30.0000 | PACK | Freq: Once | INTRAVENOUS | Status: DC | PRN

## 2019-10-17 MED ORDER — TECHNETIUM TC 99M TETROFOSMIN IV KIT
10.0000 | PACK | Freq: Once | INTRAVENOUS | Status: AC | PRN
  Administered 2019-10-17: 10 via INTRAVENOUS

## 2019-10-17 NOTE — Progress Notes (Signed)
Progress Note  Patient Name: Dennis Macias Date of Encounter: 10/17/2019  Primary Cardiologist: No primary care provider on file.   Subjective   Feeling well.  No chest pain or dyspnea.  He does get short of breath walking long distances.   Inpatient Medications    Scheduled Meds: . atorvastatin  80 mg Oral QHS  . Chlorhexidine Gluconate Cloth  6 each Topical Daily  . dorzolamide-timolol  1 drop Right Eye BID  . ferrous gluconate  324 mg Oral Q breakfast  . finasteride  5 mg Oral Daily  . furosemide  80 mg Intravenous BID  . heparin  5,000 Units Subcutaneous Q8H  . isosorbide-hydrALAZINE  1 tablet Oral TID  . metoprolol tartrate  12.5 mg Oral BID  . sodium chloride flush  3 mL Intravenous Once  . tamsulosin  0.4 mg Oral QPC supper  . vitamin B-12  1,000 mcg Oral Daily   Continuous Infusions:  PRN Meds: acetaminophen **OR** acetaminophen, technetium tetrofosmin   Vital Signs    Vitals:   10/17/19 0953 10/17/19 0955 10/17/19 0957 10/17/19 1254  BP: (!) 149/92 (!) 145/86 140/84 (!) 143/83  Pulse:    73  Resp:    16  Temp:    98.7 F (37.1 C)  TempSrc:    Oral  SpO2:    97%  Weight:      Height:        Intake/Output Summary (Last 24 hours) at 10/17/2019 1352 Last data filed at 10/17/2019 0507 Gross per 24 hour  Intake 480 ml  Output 600 ml  Net -120 ml   Last 3 Weights 10/17/2019 10/15/2019 10/14/2019  Weight (lbs) 169 lb 8.5 oz 170 lb 177 lb 14.6 oz  Weight (kg) 76.9 kg 77.111 kg 80.7 kg      Telemetry    Sinus rhythm.  No events - Personally Reviewed  ECG    10/14/19: Sinus rhythm.  Rate 70 BPM.  First degree AVB.  LAD.  Lateral TWI.   - Personally Reviewed  Physical Exam   VS:  BP (!) 143/83 (BP Location: Right Arm)   Pulse 73   Temp 98.7 F (37.1 C) (Oral)   Resp 16   Ht 6' (1.829 m)   Wt 76.9 kg   SpO2 97%   BMI 22.99 kg/m  , BMI Body mass index is 22.99 kg/m. GENERAL:  Chronically ill-appearing HEENT: Pupils equal round and reactive, fundi not  visualized, oral mucosa unremarkable NECK:  No jugular venous distention, waveform within normal limits, carotid upstroke brisk and symmetric, no bruits, no thyromegaly LYMPHATICS:  No cervical adenopathy LUNGS:  Clear to auscultation bilaterally HEART:  RRR.  PMI not displaced or sustained,S1 and S2 within normal limits, no S3, no S4, no clicks, no rubs, no murmurs ABD:  Flat, positive bowel sounds normal in frequency in pitch, no bruits, no rebound, no guarding, no midline pulsatile mass, no hepatomegaly, no splenomegaly EXT:  2 plus pulses throughout, trace edema, no cyanosis no clubbing SKIN:  No rashes no nodules NEURO:  Cranial nerves II through XII grossly intact, motor grossly intact throughout PSYCH:  Cognitively intact, oriented to person place and time   Labs    High Sensitivity Troponin:   Recent Labs  Lab 10/14/19 2259 10/15/19 0052  TROPONINIHS 20* 19*      Chemistry Recent Labs  Lab 10/16/19 0107 10/16/19 1713 10/17/19 0603  NA 141 141 140  K 5.0 4.6 4.3  CL 108 108 108  CO2  24 23 22   GLUCOSE 128* 116* 97  BUN 44* 46* 45*  CREATININE 3.35* 3.44* 3.66*  CALCIUM 9.1 8.7* 8.4*  PROT 5.6* 5.6* 5.1*  ALBUMIN 2.7* 2.7* 2.4*  AST 14* 14* 12*  ALT 11 11 11   ALKPHOS 55 50 46  BILITOT 0.6 0.3 0.6  GFRNONAA 18* 17* 16*  GFRAA 21* 20* 19*  ANIONGAP 9 10 10      Hematology Recent Labs  Lab 10/14/19 2259 10/16/19 0107 10/17/19 0603  WBC 5.8 4.1 4.8  RBC 3.43* 3.09* 2.87*  HGB 10.1* 9.2* 8.6*  HCT 32.1* 28.6* 26.2*  MCV 93.6 92.6 91.3  MCH 29.4 29.8 30.0  MCHC 31.5 32.2 32.8  RDW 13.3 13.3 13.2  PLT 265 223 207    BNP Recent Labs  Lab 10/15/19 0610  BNP 1,820.0*     DDimer No results for input(s): DDIMER in the last 168 hours.   Radiology    DG Chest 1 View  Result Date: 10/16/2019 CLINICAL DATA:  Post  thoracentesis-pt has no chest complaints EXAM: CHEST  1 VIEW COMPARISON:  10/14/2019 FINDINGS: No pneumothorax. Interval decrease in left  effusion, with small residual. Improved aeration at the left lung base. Persistent layering right pleural effusion. Heart size upper limits normal. Aortic Atherosclerosis (ICD10-170.0). Vertebral endplate spurring at multiple levels in the lower thoracic spine. IMPRESSION: No pneumothorax post left thoracentesis. Electronically Signed   By: Lucrezia Europe M.D.   On: 10/16/2019 16:25   NM Myocar Multi W/Spect W/Wall Motion / EF  Result Date: 10/17/2019 CLINICAL DATA:  Renal disease, abnormal EKG.  69 year old male EXAM: MYOCARDIAL IMAGING WITH SPECT (REST AND PHARMACOLOGIC-STRESS) GATED LEFT VENTRICULAR WALL MOTION STUDY LEFT VENTRICULAR EJECTION FRACTION TECHNIQUE: Standard myocardial SPECT imaging was performed after resting intravenous injection of 10 mCi Tc-44m tetrofosmin. Subsequently, intravenous infusion of Lexiscan was performed under the supervision of the Cardiology staff. At peak effect of the drug, 30 mCi Tc-79m tetrofosmin was injected intravenously and standard myocardial SPECT imaging was performed. Quantitative gated imaging was also performed to evaluate left ventricular wall motion, and estimate left ventricular ejection fraction. COMPARISON:  None. FINDINGS: Perfusion: There is a large region moderate to severe decreased perfusion involving the apex, apical segments and mid anterior septal wall and mid inferior wall. Decreased apical perfusion improves from stress to rest but does not normalize at the apex. Wall Motion: Global hypokinesia. LEFT ventricular dilatation. LEFT ventricular dilatation appears increased from rest to stress. Left Ventricular Ejection Fraction: 40 % End diastolic volume 517 ml End systolic volume 001 ml IMPRESSION: 1. Findings most consistent with LEFT ventricular apex infarction with peri-infarct ischemia most severe in the anterior wall but also involving the inferior wall. Overall large defect with moderate decreased perfusion. 2. Global hypokinesia.  Transient LEFT  ventricular dilatation. 3. Left ventricular ejection fraction 40% 4. Non invasive risk stratification*: High *2012 Appropriate Use Criteria for Coronary Revascularization Focused Update: J Am Coll Cardiol. 7494;49(6):759-163. http://content.airportbarriers.com.aspx?articleid=[email or web address] Insert PRA call Electronically Signed   By: Suzy Bouchard M.D.   On: 10/17/2019 11:31   ECHOCARDIOGRAM COMPLETE  Result Date: 10/15/2019    ECHOCARDIOGRAM REPORT   Patient Name:   Dennis Macias Date of Exam: 10/15/2019 Medical Rec #:  [id number]  Height:       72.0 in Accession #:    [id number] Weight:       170.0 lb Date of Birth:  [birth year]  BSA:          1.988 m Patient Age:  68 years   BP:           138/87 mmHg Patient Gender: M          HR:           66 bpm. Exam Location:  Inpatient Procedure: 2D Echo, Cardiac Doppler and Color Doppler Indications:    Dyspnea 786.09 / R06.00  History:        Patient has prior history of Echocardiogram examinations, most                 recent 09/14/2015. Stroke; Risk Factors:Hypertension, Diabetes and                 Dyslipidemia.  Sonographer:    Verdell Dykman Dance Referring Phys: [id number] [place]  1. Left ventricular ejection fraction, by estimation, is 40 to 45%. The left ventricle has mildly decreased function. The left ventricle demonstrates regional wall motion abnormalities (see scoring diagram/findings for description). There is mild asymmetric left ventricular hypertrophy of the septal segment. Left ventricular diastolic parameters are consistent with Grade I diastolic dysfunction (impaired relaxation).  2. Right ventricular systolic function is normal. The right ventricular size is normal. There is mildly elevated pulmonary artery systolic pressure.  3. Left atrial size was severely dilated.  4. Moderate pleural effusion.  5. The mitral valve is normal in structure. Mild mitral valve regurgitation. No evidence of mitral stenosis.  6. The aortic valve is tricuspid.  Aortic valve regurgitation is not visualized. Mild to moderate aortic valve sclerosis/calcification is present, without any evidence of aortic stenosis.  7. The inferior vena cava is normal in size with greater than 50% respiratory variability, suggesting right atrial pressure of 3 mmHg. Comparison(s): Changes from prior study are noted. The left ventricular function is worsened. FINDINGS  Left Ventricle: Left ventricular ejection fraction, by estimation, is 40 to 45%. The left ventricle has mildly decreased function. The left ventricle demonstrates regional wall motion abnormalities. The left ventricular internal cavity size was normal in size. There is mild asymmetric left ventricular hypertrophy of the septal segment. Left ventricular diastolic parameters are consistent with Grade I diastolic dysfunction (impaired relaxation).  LV Wall Scoring: The entire apex is akinetic. Right Ventricle: The right ventricular size is normal. No increase in right ventricular wall thickness. Right ventricular systolic function is normal. There is mildly elevated pulmonary artery systolic pressure. The tricuspid regurgitant velocity is 2.95  m/s, and with an assumed right atrial pressure of 3 mmHg, the estimated right ventricular systolic pressure is 28.4 mmHg. Left Atrium: Left atrial size was severely dilated. Right Atrium: Right atrial size was normal in size. Pericardium: Trivial pericardial effusion is present. Mitral Valve: The mitral valve is normal in structure. Normal mobility of the mitral valve leaflets. Mild mitral valve regurgitation. No evidence of mitral valve stenosis. Tricuspid Valve: The tricuspid valve is normal in structure. Tricuspid valve regurgitation is not demonstrated. No evidence of tricuspid stenosis. Aortic Valve: The aortic valve is tricuspid. Aortic valve regurgitation is not visualized. Mild to moderate aortic valve sclerosis/calcification is present, without any evidence of aortic stenosis.  Pulmonic Valve: The pulmonic valve was normal in structure. Pulmonic valve regurgitation is not visualized. No evidence of pulmonic stenosis. Aorta: The aortic root is normal in size and structure. Venous: The inferior vena cava is normal in size with greater than 50% respiratory variability, suggesting right atrial pressure of 3 mmHg. IAS/Shunts: No atrial level shunt detected by color flow Doppler. Additional Comments: There is a moderate pleural effusion.  Moderate ascites is present.  LEFT VENTRICLE PLAX 2D LVIDd:         4.80 cm  Diastology LVIDs:         3.30 cm  LV e' lateral:   5.10 cm/s LV PW:         1.10 cm  LV E/e' lateral: 18.3 LV IVS:        1.20 cm  LV e' medial:    4.20 cm/s LVOT diam:     2.20 cm  LV E/e' medial:  22.2 LV SV:         67 LV SV Index:   34 LVOT Area:     3.80 cm  RIGHT VENTRICLE             IVC RV Basal diam:  3.20 cm     IVC diam: 1.80 cm RV Mid diam:    2.20 cm RV S prime:     10.30 cm/s TAPSE (M-mode): 2.1 cm LEFT ATRIUM            Index       RIGHT ATRIUM           Index LA diam:      3.80 cm  1.91 cm/m  RA Area:     11.20 cm LA Vol (A2C): 103.0 ml 51.80 ml/m RA Volume:   21.60 ml  10.86 ml/m LA Vol (A4C): 101.0 ml 50.80 ml/m  AORTIC VALVE LVOT Vmax:   82.00 cm/s LVOT Vmean:  50.300 cm/s LVOT VTI:    0.177 m  AORTA Ao Root diam: 3.60 cm Ao Asc diam:  3.30 cm MITRAL VALVE               TRICUSPID VALVE MV Area (PHT): 4.39 cm    TR Peak grad:   34.8 mmHg MV Decel Time: 173 msec    TR Vmax:        295.00 cm/s MV E velocity: 93.10 cm/s MV A velocity: 83.90 cm/s  SHUNTS MV E/A ratio:  1.11        Systemic VTI:  0.18 m                            Systemic Diam: 2.20 cm Candee Furbish MD Electronically signed by Candee Furbish MD Signature Date/Time: 10/15/2019/5:06:01 PM    Final    IR THORACENTESIS ASP PLEURAL SPACE W/IMG GUIDE  Result Date: 10/16/2019 INDICATION: Patient with history of CKD stage 4, dyspnea, and recurrent bilateral pleural effusions, left>right. Request is made for  therapeutic left thoracentesis. EXAM: ULTRASOUND GUIDED THERAPEUTIC LEFT THORACENTESIS MEDICATIONS: 7 mL 1% lidocaine COMPLICATIONS: None immediate. PROCEDURE: An ultrasound guided thoracentesis was thoroughly discussed with the patient and questions answered. The benefits, risks, alternatives and complications were also discussed. The patient understands and wishes to proceed with the procedure. Written consent was obtained. Ultrasound was performed to localize and mark an adequate pocket of fluid in the left chest. The area was then prepped and draped in the normal sterile fashion. 1% Lidocaine was used for local anesthesia. Under ultrasound guidance a 6 Fr Safe-T-Centesis catheter was introduced. Thoracentesis was performed. The catheter was removed and a dressing applied. FINDINGS: A total of approximately 1.35 L of clear gold fluid was removed. IMPRESSION: Successful ultrasound guided left thoracentesis yielding 1.35 L of pleural fluid. Read by: Earley Abide, PA-C Electronically Signed   By: Jerilynn Mages.  Shick M.D.   On: 10/16/2019 16:16    Cardiac  Studies   Echo 10/15/19; IMPRESSIONS    1. Left ventricular ejection fraction, by estimation, is 40 to 45%. The  left ventricle has mildly decreased function. The left ventricle  demonstrates regional wall motion abnormalities (see scoring  diagram/findings for description). There is mild  asymmetric left ventricular hypertrophy of the septal segment. Left  ventricular diastolic parameters are consistent with Grade I diastolic  dysfunction (impaired relaxation).  2. Right ventricular systolic function is normal. The right ventricular  size is normal. There is mildly elevated pulmonary artery systolic  pressure.  3. Left atrial size was severely dilated.  4. Moderate pleural effusion.  5. The mitral valve is normal in structure. Mild mitral valve  regurgitation. No evidence of mitral stenosis.  6. The aortic valve is tricuspid. Aortic valve  regurgitation is not  visualized. Mild to moderate aortic valve sclerosis/calcification is  present, without any evidence of aortic stenosis.  7. The inferior vena cava is normal in size with greater than 50%  respiratory variability, suggesting right atrial pressure of 3 mmHg.   Lexiscan Myoview 10/17/19: IMPRESSION: 1. Findings most consistent with LEFT ventricular apex infarction with peri-infarct ischemia most severe in the anterior wall but also involving the inferior wall. Overall large defect with moderate decreased perfusion.  2. Global hypokinesia.  Transient LEFT ventricular dilatation.  3. Left ventricular ejection fraction 40%  4. Non invasive risk stratification*: High  Patient Profile     69 y.o. male with CDK IV 2/2 nephrotic syndrome, HTN, DM, CVA and BPH.  Cardiology consulted for presurgical risk assessment and assistance with volume management.   He was recently admitted 09/2019 with anasarca and required diuresis and R thoracentesis.  He was admitted with dyspnea and volume overload now s/p L throacentesis.  Assessment & Plan    # Chronic systolic and diastolic heart failure: # Abnormal stress test: LVEF 40-45% with apical akinesis and mild mitral regurgitaiton.  Lexiscan Myoview was positive for anterior ischemia.  However, he has stage IV kidney disease and has no chest pain. The risk of LHC outweighs the benefit.  Recommend medical management at this time.  Amlodopine was stopped 2/2 decreased systolic function.  Bidil was added.  Will switch metoprolol to succinate and increase to 50mg  daily.   # CKD IV: # Nephrotic syndrome: Dennis Macias underwent throacentesis of the L lung 4/9 with 1.35L removed.  Previously underwent R thoracentesis and has a recurrent R pleural effusion at this time.  He has minimal diuresis with IV lasix even at increased dose.  Recommend involving nephrology and consider repeat R thoracentesis.  Would avoid cath as above.      For  questions or updates, please contact [place] Please consult [email or web address] for contact info under        Signed, Skeet Latch, MD  10/17/2019, 1:52 PM

## 2019-10-17 NOTE — Consult Note (Signed)
Renal Service Consult Note [facility]  Dennis Macias 10/17/2019 Dennis Macias Requesting Physician:  Dr Philipp Ovens  Reason for Consult:  CKD pt w/ neph syndrome  HPI: The patient is a 69 y.o. year-old w/ hx of HTN, Hl, CVA, DM2, blindness, DJD presented on 4/7 w/ SOB and DOE x 1 week.  CXR showed bilat pleural effusions. Serum alb was 2.2, creat 3.27 which was close to baseline. He was taking lasix 40 bid at home. Pt was admitted and started on IV lasix 60 bid.  He is supposed to have surgery this week on 4/13 (TURP), he has an indwelling foley. Asked to see for CKD w/ vol overload.   Pt seen in room. Sees a kidney doctor in [place] but not sure who. SOB and swelling in ankles is better since admit per the pt.  BP's were high on admit 179/94 , w diuresis BP's down to 138/ 78 range.  UOP since admit 2.6 liters, wts are down 3.8 kg.  CReat is stable mid 3's.  Pt is on RA now as he was at admit on 4/7.  Pt had thoracentesis yest 4/9 w/ 1.3 L removed on the R.   Pt denies CP, fevers, abd pain.  Has indwelling foley w/ plans for TURP this week.   Pt single lives w/ GF in [place].    ROS  denies CP  no joint pain   no HA  no blurry vision  no rash  no diarrhea  no nausea/ vomiting    Past Medical History  Past Medical History:  Diagnosis Date  . Anemia   . Arthritis    past hx   . Blindness    right eye  . Cataract    removed both eyes  . Dehydration   . Diabetes ([place])   . Glaucoma   . History of CVA (cerebrovascular accident) 09/13/2015  . History of urinary retention   . Hyperlipidemia   . Hypertension   . Pernicious anemia 02/24/2018  . Stroke [facility])    2017- March  . Tachycardia 08/26/2017  . Tubular adenoma of colon 02/2017  . Weight loss, non-intentional 08/26/2017   10 lbs between 6/18 & 2/19   Past Surgical History  Past Surgical History:  Procedure Laterality Date  . CATARACT EXTRACTION, BILATERAL    . COLONOSCOPY    . IR THORACENTESIS ASP PLEURAL SPACE  W/IMG GUIDE  09/21/2019  . IR THORACENTESIS ASP PLEURAL SPACE W/IMG GUIDE  10/16/2019  . POLYPECTOMY    . REFRACTIVE SURGERY  10/2017  . TEE WITHOUT CARDIOVERSION N/A 09/14/2015   Procedure: TRANSESOPHAGEAL ECHOCARDIOGRAM (TEE);  Surgeon: Larey Dresser, MD;  Location: [facility] ENDOSCOPY;  Service: Cardiovascular;  Laterality: N/A;  . UPPER GASTROINTESTINAL ENDOSCOPY     Family History  Family History  Problem Relation Age of Onset  . Hypertension Mother   . Hyperlipidemia Mother   . Hyperlipidemia Father   . Colon cancer Neg Hx   . Colon polyps Neg Hx   . Esophageal cancer Neg Hx   . Rectal cancer Neg Hx   . Stomach cancer Neg Hx    Social History  reports that he has quit smoking. He quit smokeless tobacco use about 41 years ago.  His smokeless tobacco use included chew. He reports that he does not drink alcohol or use drugs. Allergies No Known Allergies Home medications Prior to Admission medications   Medication Sig Start Date End Date Taking? Authorizing Provider  ACCU-CHEK FASTCLIX LANCETS [place]  Check blood sugar up to 7 times a week as instructed Patient taking differently: 1 each by Other route See admin instructions. Check blood sugar up to 7 times a week as instructed 08/13/18  Yes Isabelle Course, MD  amLODipine (NORVASC) 10 MG tablet Take 1 tablet (10 mg total) by mouth daily. 07/22/19  Yes [name], Rylee, MD  atorvastatin (LIPITOR) 80 MG tablet Take 1 tablet (80 mg total) by mouth at bedtime. IM program 07/22/19  Yes [name], Rylee, MD  Blood Glucose Monitoring Suppl (ACCU-CHEK GUIDE) w/Device KIT 1 each by Does not apply route daily. Check blood sugar as instructed up to 7 times a week Patient taking differently: 1 each by Does not apply route See admin instructions. Check blood sugar as instructed up to 7 times a week 08/13/18  Yes Isabelle Course, MD  dorzolamide-timolol (COSOPT) 22.3-6.8 MG/ML ophthalmic solution Place 1 drop into the right eye 2 (two) times daily.  01/16/18  Yes  [provider]  ferrous gluconate (FERGON) 324 MG tablet Take 324 mg by mouth daily with breakfast.   Yes [provider]  finasteride (PROSCAR) 5 MG tablet Take 5 mg by mouth daily. 08/12/19  Yes [provider]  furosemide (LASIX) 40 MG tablet TAKE 1 TABLET (40 MG TOTAL) BY MOUTH 2 (TWO) TIMES DAILY. 10/10/19 10/09/20 Yes [name], Rylee, MD  glucose blood (ACCU-CHEK GUIDE) test strip Check blood sugar up to 7 times a week as instructed Patient taking differently: 1 each by Other route See admin instructions. Check blood sugar up to 7 times a week as instructed 08/13/18  Yes Isabelle Course, MD  metoprolol tartrate (LOPRESSOR) 25 MG tablet Take 0.5 tablets (12.5 mg total) by mouth 2 (two) times daily. 07/15/19 10/15/19 Yes [name], Rylee, MD  sodium bicarbonate 650 MG tablet Take 650 mg by mouth 2 (two) times daily.   Yes [provider]  tamsulosin (FLOMAX) 0.4 MG CAPS capsule TAKE 1 CAPSULE (0.4 MG TOTAL) BY MOUTH DAILY AFTER SUPPER. 10/10/19  Yes [name], Rylee, MD  vitamin B-12 (CYANOCOBALAMIN) 1000 MCG tablet Take 1,000 mcg by mouth daily.   Yes [provider]     Vitals:   10/17/19 0955 10/17/19 0957 10/17/19 1254 10/17/19 1828  BP: (!) 145/86 140/84 (!) 143/83 112/72  Pulse:   73 73  Resp:   16 18  Temp:   98.7 F (37.1 C) 97.9 F (36.6 C)  TempSrc:   Oral Oral  SpO2:   97% 95%  Weight:      Height:       Exam Gen alert, no distress  No rash, cyanosis or gangrene Sclera anicteric, throat clear  No jvd or bruits Chest L clear, R base dec'd 1/2 up RRR no MRG Abd soft ntnd no mass or ascites +bs GU defer MS no joint effusions or deformity Ext 1+ mild pretib edema Neuro is alert, Ox 3 , nf    Home meds:  - norvasc 10/ metoprolol 12.5 bid/ lasix 40 bid  - flomax 0.4 qd/ proscar 5 qd  - lipitor 80 hs  - prn's/ vitamins/ supplements    UA 4/8 - prot 100, 11-20 rbc/ 6-10 wbc, rare bacteria    4/8 Urine prot-creat ratio = 6.28         CXR 4.7 IMPRESSION: Moderate left effusion and small right effusion. Bilateral lower lobe edema or pneumonia.  Cardiomegaly, vascular congestion.    CXR 4/9 IMPRESSION: No pneumothorax post left thoracentesis.  Assessment/ Plan: 1. CKD stage IV - creat stable at baseline (2.9- 3.6 from jan-march 2021).  No uremic symptoms. Diuresing as per #2.  2. Vol overload / DOE - wt's down 3.8 kg since admit. Breathing is better. Sp R thoracentesis 1.3 L yest.  On exam has minimal extra volume at this time, mild pretib edema, no ascites, on room air.  Having good  UOP this evening.  Creat has been stable. Would continue same regimen (lasix 80 IV bid).  Will follow.  3. BPH - w/ indwelling foley, for TURP next week    Dennis Tesha Archambeau  MD 10/17/2019, 8:29 PM  Recent Labs  Lab 10/16/19 0107 10/17/19 0603  WBC 4.1 4.8  HGB 9.2* 8.6*   Recent Labs  Lab 10/13/19 1448 10/13/19 1448 10/14/19 2259 10/14/19 2259 10/16/19 0107 10/16/19 0107 10/16/19 1713 10/16/19 1713 10/17/19 0603 10/17/19 1647  K 4.5   < > 5.0   < > 5.0   < > 4.6   < > 4.3 4.2  BUN 51*   < > 47*   < > 44*   < > 46*   < > 45* 44*  CREATININE 3.26*   < > 3.27*   < > 3.35*   < > 3.44*   < > 3.66* 3.56*  CALCIUM 9.0  --  9.2  --  9.1  --  8.7*  --  8.4* 8.5*   < > = values in this interval not displayed.

## 2019-10-17 NOTE — Hospital Course (Signed)
LVEF 40-45% with apical akinesis and mild mitral regurgitaiton.  Lexiscan Myoview was positive for anterior ischemia.  However, he has stage IV kidney disease and has no chest pain. The risk of LHC outweighs the benefit.  Recommend medical management at this time.  Amlodopine was stopped 2/2 decreased systolic function.  Bidil was added.  Will switch metoprolol to succinate and increase to 50mg  daily.

## 2019-10-17 NOTE — Progress Notes (Signed)
Subjective: HD#2 Overnight, no acute events reported. This morning patient evaluated at bedside, after he came back from Myoview study. He is doing well.  Denies any chest pain, shortness of breath. We discussed goal of care and medical plan.  He asks about his upcoming TURP.  We explained that we will try to make him ready for discharge before procedure, however it depends on his clinical improvement and cardiology clearance after reviewing my view.  He agrees with the plan.  Objective:  Vital signs in last 24 hours: Vitals:   10/17/19 0953 10/17/19 0955 10/17/19 0957 10/17/19 1254  BP: (!) 149/92 (!) 145/86 140/84 (!) 143/83  Pulse:    73  Resp:    16  Temp:    98.7 F (37.1 C)  TempSrc:    Oral  SpO2:    97%  Weight:      Height:       CBC Latest Ref Rng & Units 10/17/2019 10/16/2019 10/14/2019  WBC 4.0 - 10.5 K/uL 4.8 4.1 5.8  Hemoglobin 13.0 - 17.0 g/dL 8.6(L) 9.2(L) 10.1(L)  Hematocrit 39.0 - 52.0 % 26.2(L) 28.6(L) 32.1(L)  Platelets 150 - 400 K/uL 207 223 265   CMP Latest Ref Rng & Units 10/17/2019 10/17/2019 10/16/2019  Glucose 70 - 99 mg/dL 180(H) 97 116(H)  BUN 8 - 23 mg/dL 44(H) 45(H) 46(H)  Creatinine 0.61 - 1.24 mg/dL 3.56(H) 3.66(H) 3.44(H)  Sodium 135 - 145 mmol/L 140 140 141  Potassium 3.5 - 5.1 mmol/L 4.2 4.3 4.6  Chloride 98 - 111 mmol/L 108 108 108  CO2 22 - 32 mmol/L 25 22 23   Calcium 8.9 - 10.3 mg/dL 8.5(L) 8.4(L) 8.7(L)  Total Protein 6.5 - 8.1 g/dL 5.3(L) 5.1(L) 5.6(L)  Total Bilirubin 0.3 - 1.2 mg/dL 0.2(L) 0.6 0.3  Alkaline Phos 38 - 126 U/L 46 46 50  AST 15 - 41 U/L 13(L) 12(L) 14(L)  ALT 0 - 44 U/L 11 11 11     Intake/Output Summary (Last 24 hours) at 10/17/2019 1810 Last data filed at 10/17/2019 0507 Gross per 24 hour  Intake 480 ml  Output 600 ml  Net -120 ml   Physical Exam  Constitutional: He is oriented to person, place, and time. Vital signs are normal. He appears malnourished. He appears unhealthy. He does not have a sickly appearance. No  distress.  HENT:  Head: Normocephalic and atraumatic.  Cardiovascular: Normal rate, regular rhythm, normal heart sounds and intact distal pulses.  Pulmonary/Chest: Effort normal. No respiratory distress. He has no wheezes.  Decreased breath sounds at bilateral bases   Abdominal: Soft. Bowel sounds are normal. He exhibits no distension. There is no abdominal tenderness.  Musculoskeletal:        General: Edema present. No tenderness or deformity. Normal range of motion.     Comments: 1+ pitting edema of bilateral lower extremities to mid-tibia  Neurological: He is alert and oriented to person, place, and time.  Skin: Skin is warm and dry. No rash noted. He is not diaphoretic. No erythema.    Assessment/Plan:  Dennis Macias is a 69yr old male with PMHx of CKD-IV with nephrotic syndrome, hypertension, type 2 diabetes mellitus, BPH w/obstruction and indwelling foley catheter, hematuria and anemia presenting with shortness of breath in setting of bilateral pleural effusions.  Bilateral pleural effusions, in setting of: HFrEF exacerbation, CKD with nephrotic syndrome, Patient presented with few days of worsening dyspnea with bilateral pleural effusions on chest x-ray.  Had elevated BNP -Worsening of protein to creatinine  ratio -echo showed EF 40 to 45% and apical akinesis that is new since the prior echo in 2017.  -Received IV Lasix -Cardiology was consulted given new apical akinesia and recommended Myoview> suggestive of anterior ischemia, however given CKD, patient is not a candidate for left heart cath and cardiology recommended medical management.  He is asymptomatic. -Appreciate cardiology recommendation -Patient is a status post left side thoracentesis and cardiology recommended right side thoracentesis and evaluation by nephrology as patient did not have enough output with diuresis. -Nephrology consulted. Appreciate recommendations for diuresis. -IR consulted for right-sided thoracentesis.   -Appreciate cardiology commendations for further recommendation given upcoming TURP procedure.- Revised cardiac index of 3 which correlates with 15% 30-day risk of MI, stroke and cardiac arrest - Strict I&O - Continuous cardiac monitoring - Daily weights  -BMP daily  CKD IV with nephrotic syndrome: Patient with history of nephrotic syndrome, serum creatinine is stable today however, protein creatinine ratio is elevated on this admission. -Diuresis per nephrology.  Appreciate recommendation -Monitor renal function -Avoid nephrotoxic agents   BPH w/indwelling foley:  Patient has indwelling foley for obstructive BPH. He has TURP scheduled on 4/13 with Dr. Roni Bread.  - Continue tamsulosin and finasteride - Continue foley catheter   FEN/GI: Diet: Renal/CM Electrolytes: Monitor and replete prn Fluids: None  DVT Prophylaxis: Heparin Code status: FULL code    Prior to Admission Living Arrangement: Home Anticipated Discharge Location: Home w/HH Barriers to Discharge: Ongoing medical evaluation and treatment  Dispo: Anticipated discharge in approximately 2-3 day(s).   [name], MD  Internal Medicine, PGY-1 10/17/2019, 6:10 PM Pager: [phone]

## 2019-10-17 NOTE — Progress Notes (Signed)
Spoke with Dennis Macias, [name]'s daughter, regarding results of the stress test and current plan for diuresis. Dennis Macias expressed understanding. All other questions and concerns addressed.

## 2019-10-18 ENCOUNTER — Inpatient Hospital Stay (HOSPITAL_COMMUNITY): Payer: Medicare PPO

## 2019-10-18 DIAGNOSIS — I5041 Acute combined systolic (congestive) and diastolic (congestive) heart failure: Secondary | ICD-10-CM

## 2019-10-18 LAB — COMPREHENSIVE METABOLIC PANEL
ALT: 10 U/L (ref 0–44)
ALT: 11 U/L (ref 0–44)
AST: 13 U/L — ABNORMAL LOW (ref 15–41)
AST: 13 U/L — ABNORMAL LOW (ref 15–41)
Albumin: 2.4 g/dL — ABNORMAL LOW (ref 3.5–5.0)
Albumin: 2.5 g/dL — ABNORMAL LOW (ref 3.5–5.0)
Alkaline Phosphatase: 51 U/L (ref 38–126)
Alkaline Phosphatase: 48 U/L (ref 38–126)
Anion gap: 10 (ref 5–15)
Anion gap: 7 (ref 5–15)
BUN: 45 mg/dL — ABNORMAL HIGH (ref 8–23)
BUN: 42 mg/dL — ABNORMAL HIGH (ref 8–23)
CO2: 24 mmol/L (ref 22–32)
CO2: 24 mmol/L (ref 22–32)
Calcium: 8.5 mg/dL — ABNORMAL LOW (ref 8.9–10.3)
Calcium: 8.4 mg/dL — ABNORMAL LOW (ref 8.9–10.3)
Chloride: 108 mmol/L (ref 98–111)
Chloride: 109 mmol/L (ref 98–111)
Creatinine, Ser: 3.52 mg/dL — ABNORMAL HIGH (ref 0.61–1.24)
Creatinine, Ser: 3.49 mg/dL — ABNORMAL HIGH (ref 0.61–1.24)
GFR calc Af Amer: 19 mL/min — ABNORMAL LOW (ref >60)
GFR calc Af Amer: 20 mL/min — ABNORMAL LOW (ref >60)
GFR calc non Af Amer: 17 mL/min — ABNORMAL LOW (ref >60)
GFR calc non Af Amer: 17 mL/min — ABNORMAL LOW (ref >60)
Glucose, Bld: 114 mg/dL — ABNORMAL HIGH (ref 70–99)
Glucose, Bld: 195 mg/dL — ABNORMAL HIGH (ref 70–99)
Potassium: 4.2 mmol/L (ref 3.5–5.1)
Potassium: 3.8 mmol/L (ref 3.5–5.1)
Sodium: 142 mmol/L (ref 135–145)
Sodium: 140 mmol/L (ref 135–145)
Total Bilirubin: 0.5 mg/dL (ref 0.3–1.2)
Total Bilirubin: 0.6 mg/dL (ref 0.3–1.2)
Total Protein: 5 g/dL — ABNORMAL LOW (ref 6.5–8.1)
Total Protein: 5.1 g/dL — ABNORMAL LOW (ref 6.5–8.1)

## 2019-10-18 LAB — CBC
HCT: 26.5 % — ABNORMAL LOW (ref 39.0–52.0)
Hemoglobin: 8.6 g/dL — ABNORMAL LOW (ref 13.0–17.0)
MCH: 29.7 pg (ref 26.0–34.0)
MCHC: 32.5 g/dL (ref 30.0–36.0)
MCV: 91.4 fL (ref 80.0–100.0)
Platelets: 198 10*3/uL (ref 150–400)
RBC: 2.9 MIL/uL — ABNORMAL LOW (ref 4.22–5.81)
RDW: 13.3 % (ref 11.5–15.5)
WBC: 5.1 10*3/uL (ref 4.0–10.5)
nRBC: 0 % (ref 0.0–0.2)

## 2019-10-18 MED ORDER — LIDOCAINE HCL (PF) 1 % IJ SOLN
INTRAMUSCULAR | Status: AC
  Filled 2019-10-18: qty 30

## 2019-10-18 NOTE — Progress Notes (Signed)
Subjective: Volume overload much improved after diuresis. Nephrology and cardiology following as well. Patient would like to have TURP on 4/13 as scheduled.   Objective: Vital signs in last 24 hours: Temp:  [97.9 F (36.6 C)-98.8 F (37.1 C)] 98.8 F (37.1 C) (04/11 0503) Pulse Rate:  [69-73] 71 (04/11 0503) Resp:  [16-18] 18 (04/11 0503) BP: (112-149)/(71-92) 139/71 (04/11 0503) SpO2:  [95 %-97 %] 97 % (04/11 0503) Weight:  [73.1 kg] 73.1 kg (04/11 0503)  Intake/Output from previous day: 04/10 0701 - 04/11 0700 In: -  Out: 1850 [Urine:1850] Intake/Output this shift: No intake/output data recorded.  Physical Exam:  General: Alert and oriented CV: Regular rate Lungs: Clear GU: urine clear, foley draining Extremities: no LE edema  Lab Results: Recent Labs    10/16/19 0107 10/17/19 0603 10/18/19 0454  HGB 9.2* 8.6* 8.6*  HCT 28.6* 26.2* 26.5*   BMET Recent Labs    10/17/19 1647 10/18/19 0454  NA 140 142  K 4.2 4.2  CL 108 108  CO2 25 24  GLUCOSE 180* 114*  BUN 44* 45*  CREATININE 3.56* 3.52*  CALCIUM 8.5* 8.5*     Studies/Results: DG Chest 1 View  Result Date: 10/16/2019 CLINICAL DATA:  Post  thoracentesis-pt has no chest complaints EXAM: CHEST  1 VIEW COMPARISON:  10/14/2019 FINDINGS: No pneumothorax. Interval decrease in left effusion, with small residual. Improved aeration at the left lung base. Persistent layering right pleural effusion. Heart size upper limits normal. Aortic Atherosclerosis (ICD10-170.0). Vertebral endplate spurring at multiple levels in the lower thoracic spine. IMPRESSION: No pneumothorax post left thoracentesis. Electronically Signed   By: Lucrezia Europe M.D.   On: 10/16/2019 16:25   NM Myocar Multi W/Spect W/Wall Motion / EF  Result Date: 10/17/2019 CLINICAL DATA:  Renal disease, abnormal EKG.  69 year old male EXAM: MYOCARDIAL IMAGING WITH SPECT (REST AND PHARMACOLOGIC-STRESS) GATED LEFT VENTRICULAR WALL MOTION STUDY LEFT VENTRICULAR  EJECTION FRACTION TECHNIQUE: Standard myocardial SPECT imaging was performed after resting intravenous injection of 10 mCi Tc-7m tetrofosmin. Subsequently, intravenous infusion of Lexiscan was performed under the supervision of the Cardiology staff. At peak effect of the drug, 30 mCi Tc-36m tetrofosmin was injected intravenously and standard myocardial SPECT imaging was performed. Quantitative gated imaging was also performed to evaluate left ventricular wall motion, and estimate left ventricular ejection fraction. COMPARISON:  None. FINDINGS: Perfusion: There is a large region moderate to severe decreased perfusion involving the apex, apical segments and mid anterior septal wall and mid inferior wall. Decreased apical perfusion improves from stress to rest but does not normalize at the apex. Wall Motion: Global hypokinesia. LEFT ventricular dilatation. LEFT ventricular dilatation appears increased from rest to stress. Left Ventricular Ejection Fraction: 40 % End diastolic volume 536 ml End systolic volume 644 ml IMPRESSION: 1. Findings most consistent with LEFT ventricular apex infarction with peri-infarct ischemia most severe in the anterior wall but also involving the inferior wall. Overall large defect with moderate decreased perfusion. 2. Global hypokinesia.  Transient LEFT ventricular dilatation. 3. Left ventricular ejection fraction 40% 4. Non invasive risk stratification*: High *2012 Appropriate Use Criteria for Coronary Revascularization Focused Update: J Am Coll Cardiol. 0347;42(5):956-387. http://content.airportbarriers.com.aspx?articleid=[email or web address] Insert PRA call Electronically Signed   By: Suzy Bouchard M.D.   On: 10/17/2019 11:31   IR THORACENTESIS ASP PLEURAL SPACE W/IMG GUIDE  Result Date: 10/16/2019 INDICATION: Patient with history of CKD stage 4, dyspnea, and recurrent bilateral pleural effusions, left>right. Request is made for therapeutic left thoracentesis. EXAM: ULTRASOUND GUIDED  THERAPEUTIC LEFT  THORACENTESIS MEDICATIONS: 7 mL 1% lidocaine COMPLICATIONS: None immediate. PROCEDURE: An ultrasound guided thoracentesis was thoroughly discussed with the patient and questions answered. The benefits, risks, alternatives and complications were also discussed. The patient understands and wishes to proceed with the procedure. Written consent was obtained. Ultrasound was performed to localize and mark an adequate pocket of fluid in the left chest. The area was then prepped and draped in the normal sterile fashion. 1% Lidocaine was used for local anesthesia. Under ultrasound guidance a 6 Fr Safe-T-Centesis catheter was introduced. Thoracentesis was performed. The catheter was removed and a dressing applied. FINDINGS: A total of approximately 1.35 L of clear gold fluid was removed. IMPRESSION: Successful ultrasound guided left thoracentesis yielding 1.35 L of pleural fluid. Read by: Earley Abide, PA-C Electronically Signed   By: Jerilynn Mages.  Shick M.D.   On: 10/16/2019 16:16    Assessment/Plan: 69 yo M with CKD and CHF exacerbation scheduled to undergo TURP with Dr Jeffie Pollock with [facility] on 4/13. We discussed patient with primary team MD at bedside this morning (Dr Myrtie Hawk), and anticipate that patient will be well enough for procedure on 4/13, pending final clearance from nephrology and cardiology teams as well. He will either discharge tomorrow and come back for TURP or else TURP could be done as a temporary transfer to [facility] and back if he is still inpatient at [facility] on 4/13.  - We will plan to proceed with TURP on 4/13 pending clearance from cardiology and nephrology teams. This can be done outpatient if he is discharged or inpatient as a temporary transfer if he is still hospitalized.    LOS: 3 days   Haskel Schroeder 10/18/2019, 9:27 AM

## 2019-10-18 NOTE — Progress Notes (Signed)
Subjective:  Patient was seen and evaluated at bedside on morning rounds. No acute events overnight.  No shortness of breath, no chest pain.  Urology team is at his room.   Objective:  Vital signs in last 24 hours: Vitals:   10/17/19 1828 10/17/19 2100 10/17/19 2349 10/18/19 0503  BP: 112/72 134/72 120/73 139/71  Pulse: 73 70 69 71  Resp: 18  18 18   Temp: 97.9 F (36.6 C)  98.4 F (36.9 C) 98.8 F (37.1 C)  TempSrc: Oral  Oral Oral  SpO2: 95%  97% 97%  Weight:    73.1 kg  Height:        Constitutional: Well-developed and well-nourished. No acute distress.  Head: Normocephalic and atraumatic.  Eyes: Conjunctivae are normal, EOM nl Cardiovascular: RRR, nl S1S2, no murmur, trace LEE Respiratory: Effort normal and breath sounds normal. No respiratory distress. No wheezes, some decreased breath sound at right lower base. GI: Soft. Bowel sounds are normal. No distension. There is no tenderness.  Neurological: Is alert and oriented x 3  Skin: Not diaphoretic. No erythema.  Psychiatric:  Normal mood and affect. Behavior is normal. Judgment and thought content normal.    Assessment/Plan:  Active Problems:   BPH with urinary obstruction   CKD (chronic kidney disease) stage 4, GFR 15-29 ml/min (HCC)   Nephrotic syndrome   Pleural effusion transudative   Dyspnea   Cardiorenal syndrome    Dennis Macias is a 69yr old male with PMHx of CKD-IV with nephrotic syndrome, hypertension, type 2 diabetes mellitus, BPH w/obstruction and indwelling foley catheter, hematuria and anemia presenting with shortness of breath in setting of bilateral pleural effusions.  Bilateral pleural effusions, in setting of: HFrEF exacerbation, CKD with nephrotic syndrome, Patient presented with few days of worsening dyspnea with bilateral pleural effusions on chest x-ray.  Had elevated BNP -Worsening of protein to creatinine ratio -echo showed EF 40 to 45% and apical akinesis that is new since the prior  echo in 2017.  -Received IV Lasix -Cardiology was consulted given new apical akinesia and recommended Myoview> suggestive of anterior ischemia, however given CKD, patient is not a candidate for left heart cath and cardiology recommended medical management.  He is asymptomatic. -Appreciate cardiology follow up -Continue BIDIL, Metoprolol  -Appreciate cardiology commendations for further recommendation given upcoming TURP procedure.- Revised cardiac index of 3 which correlates with 15% 30-day risk of MI, stroke and cardiac arrest - Strict I&O - Continuous cardiac monitoring - Daily weights  -BMP daily  CKD IV with nephrotic syndrome: Patient with history of nephrotic syndrome, and CKD. Presented with LE edema/anasarka and pleural effusion.  protein creatinine ratio is elevated on this admission. Giving IV Lasix, still not clear as well. s/p left thora.  Plan to do right Thora by IR today. Creatinine is stable -Diuresis per nephrology: IV Lasix 80 mg BID.  Appreciate follow up  -Cr stable -Edema is improving -Avoid nephrotoxic agents -Renal function daily -Will continue diuresis per nephro  Intake/Output Summary (Last 24 hours) at 10/18/2019 1558 Last data filed at 10/18/2019 0511 Gross per 24 hour  Intake --  Output 1850 ml  Net -1850 ml    BPH w/indwelling foley:  Patient has indwelling foley for obstructive BPH. He has TURP scheduled on 4/13 with Dr. Roni Bread.  - Continue tamsulosin and finasteride - Continue foley catheter   FEN/GI: Diet: Renal/CM Electrolytes: Monitor and replete prn Fluids: None  DVT Prophylaxis: Heparin Code status: FULL code  Prior to Admission Living Arrangement:  Anticipated Discharge Location: Barriers to Discharge: Dispo: Anticipated discharge depends on clinical improvement   [name], MD 10/18/2019, 7:07 AM Pager: @[phone] @

## 2019-10-18 NOTE — Procedures (Signed)
Ultrasound-guided diagnostic and therapeutic right sided thoracentesis performed yielding 1.5liters of straw colored fluid. No immediate complications.* Follow-up chest x-ray pending. EBL is none.

## 2019-10-18 NOTE — Progress Notes (Signed)
Progress Note  Patient Name: Dennis Macias Date of Encounter: 10/18/2019  Primary Cardiologist: No primary care provider on file.   Subjective   Feeling well.  No chest pain or dyspnea.  He does get short of breath walking long distances.   Inpatient Medications    Scheduled Meds: . atorvastatin  80 mg Oral QHS  . Chlorhexidine Gluconate Cloth  6 each Topical Daily  . dorzolamide-timolol  1 drop Right Eye BID  . ferrous gluconate  324 mg Oral Q breakfast  . finasteride  5 mg Oral Daily  . furosemide  80 mg Intravenous BID  . heparin  5,000 Units Subcutaneous Q8H  . isosorbide-hydrALAZINE  1 tablet Oral TID  . lidocaine (PF)      . metoprolol succinate  50 mg Oral Daily  . sodium chloride flush  3 mL Intravenous Once  . tamsulosin  0.4 mg Oral QPC supper  . vitamin B-12  1,000 mcg Oral Daily   Continuous Infusions:  PRN Meds: acetaminophen **OR** acetaminophen, technetium tetrofosmin   Vital Signs    Vitals:   10/17/19 2349 10/18/19 0503 10/18/19 0900 10/18/19 1247  BP: 120/73 139/71 135/69 129/64  Pulse: 69 71  68  Resp: 18 18  16   Temp: 98.4 F (36.9 C) 98.8 F (37.1 C)  98.4 F (36.9 C)  TempSrc: Oral Oral  Oral  SpO2: 97% 97%  97%  Weight:  73.1 kg    Height:        Intake/Output Summary (Last 24 hours) at 10/18/2019 1506 Last data filed at 10/18/2019 0511 Gross per 24 hour  Intake --  Output 1850 ml  Net -1850 ml   Last 3 Weights 10/18/2019 10/17/2019 10/15/2019  Weight (lbs) 161 lb 1.6 oz 169 lb 8.5 oz 170 lb  Weight (kg) 73.074 kg 76.9 kg 77.111 kg      Telemetry    Sinus rhythm.  No events - Personally Reviewed  ECG    10/14/19: Sinus rhythm.  Rate 70 BPM.  First degree AVB.  LAD.  Lateral TWI.   - Personally Reviewed  Physical Exam   VS:  BP 129/64 (BP Location: Right Arm)   Pulse 68   Temp 98.4 F (36.9 C) (Oral)   Resp 16   Ht 6' (1.829 m)   Wt 73.1 kg   SpO2 97%   BMI 21.85 kg/m  , BMI Body mass index is 21.85 kg/m. GENERAL:   Chronically ill-appearing HEENT: Pupils equal round and reactive, fundi not visualized, oral mucosa unremarkable NECK:  No jugular venous distention, waveform within normal limits, carotid upstroke brisk and symmetric, no bruits, no thyromegaly LYMPHATICS:  No cervical adenopathy LUNGS:  Clear to auscultation bilaterally HEART:  RRR.  PMI not displaced or sustained,S1 and S2 within normal limits, no S3, no S4, no clicks, no rubs, no murmurs ABD:  Flat, positive bowel sounds normal in frequency in pitch, no bruits, no rebound, no guarding, no midline pulsatile mass, no hepatomegaly, no splenomegaly EXT:  2 plus pulses throughout, trace edema, no cyanosis no clubbing SKIN:  No rashes no nodules NEURO:  Cranial nerves II through XII grossly intact, motor grossly intact throughout PSYCH:  Cognitively intact, oriented to person place and time   Labs    High Sensitivity Troponin:   Recent Labs  Lab 10/14/19 2259 10/15/19 0052  TROPONINIHS 20* 19*      Chemistry Recent Labs  Lab 10/17/19 0603 10/17/19 1647 10/18/19 0454  NA 140 140 142  K  4.3 4.2 4.2  CL 108 108 108  CO2 22 25 24   GLUCOSE 97 180* 114*  BUN 45* 44* 45*  CREATININE 3.66* 3.56* 3.52*  CALCIUM 8.4* 8.5* 8.5*  PROT 5.1* 5.3* 5.0*  ALBUMIN 2.4* 2.4* 2.4*  AST 12* 13* 13*  ALT 11 11 10   ALKPHOS 46 46 51  BILITOT 0.6 0.2* 0.5  GFRNONAA 16* 17* 17*  GFRAA 19* 19* 19*  ANIONGAP 10 7 10      Hematology Recent Labs  Lab 10/16/19 0107 10/17/19 0603 10/18/19 0454  WBC 4.1 4.8 5.1  RBC 3.09* 2.87* 2.90*  HGB 9.2* 8.6* 8.6*  HCT 28.6* 26.2* 26.5*  MCV 92.6 91.3 91.4  MCH 29.8 30.0 29.7  MCHC 32.2 32.8 32.5  RDW 13.3 13.2 13.3  PLT 223 207 198    BNP Recent Labs  Lab 10/15/19 0610  BNP 1,820.0*     DDimer No results for input(s): DDIMER in the last 168 hours.   Radiology    DG Chest 1 View  Result Date: 10/18/2019 CLINICAL DATA:  Post thoracentesis. 1.5L off right side. Hx of diabetes, CVA,  hypertension, tachycardia. Former smoker(1980). EXAM: CHEST  1 VIEW COMPARISON:  10/16/2019 FINDINGS: No pneumothorax. No evident residual right pleural effusion. Central pulmonary vascular congestion with some increase in interstitial opacities at the left lung base. Heart size and mediastinal contours are within normal limits. Aortic Atherosclerosis (ICD10-170.0). Vertebral endplate spurring at multiple levels in the mid and lower thoracic spine. IMPRESSION: No pneumothorax post right thoracentesis. Electronically Signed   By: Lucrezia Europe M.D.   On: 10/18/2019 11:34   DG Chest 1 View  Result Date: 10/16/2019 CLINICAL DATA:  Post  thoracentesis-pt has no chest complaints EXAM: CHEST  1 VIEW COMPARISON:  10/14/2019 FINDINGS: No pneumothorax. Interval decrease in left effusion, with small residual. Improved aeration at the left lung base. Persistent layering right pleural effusion. Heart size upper limits normal. Aortic Atherosclerosis (ICD10-170.0). Vertebral endplate spurring at multiple levels in the lower thoracic spine. IMPRESSION: No pneumothorax post left thoracentesis. Electronically Signed   By: Lucrezia Europe M.D.   On: 10/16/2019 16:25   NM Myocar Multi W/Spect W/Wall Motion / EF  Result Date: 10/17/2019 CLINICAL DATA:  Renal disease, abnormal EKG.  69 year old male EXAM: MYOCARDIAL IMAGING WITH SPECT (REST AND PHARMACOLOGIC-STRESS) GATED LEFT VENTRICULAR WALL MOTION STUDY LEFT VENTRICULAR EJECTION FRACTION TECHNIQUE: Standard myocardial SPECT imaging was performed after resting intravenous injection of 10 mCi Tc-87m tetrofosmin. Subsequently, intravenous infusion of Lexiscan was performed under the supervision of the Cardiology staff. At peak effect of the drug, 30 mCi Tc-70m tetrofosmin was injected intravenously and standard myocardial SPECT imaging was performed. Quantitative gated imaging was also performed to evaluate left ventricular wall motion, and estimate left ventricular ejection fraction.  COMPARISON:  None. FINDINGS: Perfusion: There is a large region moderate to severe decreased perfusion involving the apex, apical segments and mid anterior septal wall and mid inferior wall. Decreased apical perfusion improves from stress to rest but does not normalize at the apex. Wall Motion: Global hypokinesia. LEFT ventricular dilatation. LEFT ventricular dilatation appears increased from rest to stress. Left Ventricular Ejection Fraction: 40 % End diastolic volume 295 ml End systolic volume 621 ml IMPRESSION: 1. Findings most consistent with LEFT ventricular apex infarction with peri-infarct ischemia most severe in the anterior wall but also involving the inferior wall. Overall large defect with moderate decreased perfusion. 2. Global hypokinesia.  Transient LEFT ventricular dilatation. 3. Left ventricular ejection fraction 40% 4. Non  invasive risk stratification*: High *2012 Appropriate Use Criteria for Coronary Revascularization Focused Update: J Am Coll Cardiol. 8527;78(2):423-536. http://content.airportbarriers.com.aspx?articleid=1201161 Insert PRA call Electronically Signed   By: Suzy Bouchard M.D.   On: 10/17/2019 11:31   IR THORACENTESIS ASP PLEURAL SPACE W/IMG GUIDE  Result Date: 10/16/2019 INDICATION: Patient with history of CKD stage 4, dyspnea, and recurrent bilateral pleural effusions, left>right. Request is made for therapeutic left thoracentesis. EXAM: ULTRASOUND GUIDED THERAPEUTIC LEFT THORACENTESIS MEDICATIONS: 7 mL 1% lidocaine COMPLICATIONS: None immediate. PROCEDURE: An ultrasound guided thoracentesis was thoroughly discussed with the patient and questions answered. The benefits, risks, alternatives and complications were also discussed. The patient understands and wishes to proceed with the procedure. Written consent was obtained. Ultrasound was performed to localize and mark an adequate pocket of fluid in the left chest. The area was then prepped and draped in the normal sterile  fashion. 1% Lidocaine was used for local anesthesia. Under ultrasound guidance a 6 Fr Safe-T-Centesis catheter was introduced. Thoracentesis was performed. The catheter was removed and a dressing applied. FINDINGS: A total of approximately 1.35 L of clear gold fluid was removed. IMPRESSION: Successful ultrasound guided left thoracentesis yielding 1.35 L of pleural fluid. Read by: Earley Abide, PA-C Electronically Signed   By: Jerilynn Mages.  Shick M.D.   On: 10/16/2019 16:16    Cardiac Studies   Echo 10/15/19; IMPRESSIONS   1. Left ventricular ejection fraction, by estimation, is 40 to 45%. The  left ventricle has mildly decreased function. The left ventricle  demonstrates regional wall motion abnormalities (see scoring  diagram/findings for description). There is mild  asymmetric left ventricular hypertrophy of the septal segment. Left  ventricular diastolic parameters are consistent with Grade I diastolic  dysfunction (impaired relaxation).  2. Right ventricular systolic function is normal. The right ventricular  size is normal. There is mildly elevated pulmonary artery systolic  pressure.  3. Left atrial size was severely dilated.  4. Moderate pleural effusion.  5. The mitral valve is normal in structure. Mild mitral valve  regurgitation. No evidence of mitral stenosis.  6. The aortic valve is tricuspid. Aortic valve regurgitation is not  visualized. Mild to moderate aortic valve sclerosis/calcification is  present, without any evidence of aortic stenosis.  7. The inferior vena cava is normal in size with greater than 50%  respiratory variability, suggesting right atrial pressure of 3 mmHg.   Lexiscan Myoview 10/17/19: IMPRESSION: 1. Findings most consistent with LEFT ventricular apex infarction with peri-infarct ischemia most severe in the anterior wall but also involving the inferior wall. Overall large defect with moderate decreased perfusion.  2. Global hypokinesia.  Transient  LEFT ventricular dilatation.  3. Left ventricular ejection fraction 40%  4. Non invasive risk stratification*: High  Patient Profile     69 y.o. male with CDK IV 2/2 nephrotic syndrome, HTN, DM, CVA and BPH.  Cardiology consulted for presurgical risk assessment and assistance with volume management.   He was recently admitted 09/2019 with anasarca and required diuresis and R thoracentesis.  He was admitted with dyspnea and volume overload now s/p L throacentesis.  Assessment & Plan    # Chronic systolic and diastolic heart failure: # Abnormal stress test: # Essential hypertension: LVEF 40-45% with apical akinesis and mild mitral regurgitaiton.  Lexiscan Myoview was positive for anterior ischemia.  However, he has stage IV kidney disease and has no chest pain. The risk of LHC outweighs the benefit.  Recommend medical management at this time.  Amlodopine was stopped 2/2 decreased systolic function.  Bidil was added for afterload reduction.  Metoprolol was switched to succinate and increased.  BP stable and respiratory status improved post thoracentesis.  # Pre-surgical risk assessment: Mr. Engel is scheduled to undergo TURP on 4/13.  He has new heart failure and an abnormal stress test, though he doesn't have chest pain.  Given his renal function, there are no plans for [facility] as above.  Also, if he were to need PCI, his need for anticoagulation would prevent TURP for at least 6 months.  He is at high risk for surgery.  RCRI class IV, 11% risk of major cardiac event.  He is medically optimized as much as possible.  Respiratory status improved after bilateral thoracenteses.  He is on aspirin and a beta blocker.  Delaying surgery is unlikely to improve his risk profile.  If Mr. Minnehan and the surgical team decide that the benefit outweighs the risk, we will be available if needed.   # CKD IV: # Nephrotic syndrome: Mr. Serano underwent throacentesis of the L lung 4/9 with 1.35L removed and 1.5L from the  R on 4/10.  Previously underwent R thoracentesis at his last hospitalization.  He is receiving albumin.  Nephrology following.  Recommend avoiding cath as above.      For questions or updates, please contact [place] Please consult [email or web address] for contact info under        Signed, Skeet Latch, MD  10/18/2019, 3:06 PM

## 2019-10-18 NOTE — Progress Notes (Signed)
[facility] Progress Note  Subjective: 1.8 L UOP yest and 1.6 so far today. No new c/o.   Vitals:   10/17/19 2349 10/18/19 0503 10/18/19 0900 10/18/19 1247  BP: 120/73 139/71 135/69 129/64  Pulse: 69 71  68  Resp: 18 18  16   Temp: 98.4 F (36.9 C) 98.8 F (37.1 C)  98.4 F (36.9 C)  TempSrc: Oral Oral  Oral  SpO2: 97% 97%  97%  Weight:  73.1 kg    Height:        Exam: Gen alert, no distress  No rash, cyanosis or gangrene Sclera anicteric, throat clear  No jvd or bruits Chest L clear, R base dec'd 1/2 up RRR no MRG Abd soft ntnd no mass or ascites +bs GU defer MS no joint effusions or deformity Ext 1+ mild pretib edema Neuro is alert, Ox 3 , nf    Home meds:  - norvasc 10/ metoprolol 12.5 bid/ lasix 40 bid  - flomax 0.4 qd/ proscar 5 qd  - lipitor 80 hs  - prn's/ vitamins/ supplements    UA 4/8 - prot 100, 11-20 rbc/ 6-10 wbc, rare bacteria    4/8 Urine prot-creat ratio = 6.28        CXR 4.7 IMPRESSION: Moderate left effusion and small right effusion. Bilateral lower lobe edema or pneumonia.  Cardiomegaly, vascular congestion.    CXR 4/9 IMPRESSION: No pneumothorax post left thoracentesis.       Assessment/ Plan: 1. CKD stage IV - creat stable at baseline (2.9- 3.6 from jan-march 2021).  No uremic symptoms. Diuresing. No new suggestions. Will sign off.  2. Vol overload / DOE - wt's down 6-7  kg since admission. Breathing is better. Sp bilat thoracentesis.   On exam does not have much extra volume. Creat has been stable w/ diuresis.  3. BPH - w/ indwelling foley, for TURP next week    Rob Marshae Azam 10/18/2019, 4:09 PM   Recent Labs  Lab 10/17/19 0603 10/17/19 0603 10/17/19 1647 10/18/19 0454  K 4.3   < > 4.2 4.2  BUN 45*   < > 44* 45*  CREATININE 3.66*   < > 3.56* 3.52*  CALCIUM 8.4*   < > 8.5* 8.5*  HGB 8.6*  --   --  8.6*   < > = values in this interval not displayed.   Inpatient medications: . atorvastatin  80 mg Oral QHS  .  Chlorhexidine Gluconate Cloth  6 each Topical Daily  . dorzolamide-timolol  1 drop Right Eye BID  . ferrous gluconate  324 mg Oral Q breakfast  . finasteride  5 mg Oral Daily  . furosemide  80 mg Intravenous BID  . heparin  5,000 Units Subcutaneous Q8H  . isosorbide-hydrALAZINE  1 tablet Oral TID  . lidocaine (PF)      . metoprolol succinate  50 mg Oral Daily  . sodium chloride flush  3 mL Intravenous Once  . tamsulosin  0.4 mg Oral QPC supper  . vitamin B-12  1,000 mcg Oral Daily    acetaminophen **OR** acetaminophen, technetium tetrofosmin

## 2019-10-19 DIAGNOSIS — R0600 Dyspnea, unspecified: Secondary | ICD-10-CM

## 2019-10-19 DIAGNOSIS — I255 Ischemic cardiomyopathy: Secondary | ICD-10-CM

## 2019-10-19 LAB — COMPREHENSIVE METABOLIC PANEL
ALT: 13 U/L (ref 0–44)
AST: 12 U/L — ABNORMAL LOW (ref 15–41)
Albumin: 2.4 g/dL — ABNORMAL LOW (ref 3.5–5.0)
Alkaline Phosphatase: 43 U/L (ref 38–126)
Anion gap: 10 (ref 5–15)
BUN: 43 mg/dL — ABNORMAL HIGH (ref 8–23)
CO2: 23 mmol/L (ref 22–32)
Calcium: 8.4 mg/dL — ABNORMAL LOW (ref 8.9–10.3)
Chloride: 107 mmol/L (ref 98–111)
Creatinine, Ser: 3.52 mg/dL — ABNORMAL HIGH (ref 0.61–1.24)
GFR calc Af Amer: 19 mL/min — ABNORMAL LOW (ref >60)
GFR calc non Af Amer: 17 mL/min — ABNORMAL LOW (ref >60)
Glucose, Bld: 112 mg/dL — ABNORMAL HIGH (ref 70–99)
Potassium: 4 mmol/L (ref 3.5–5.1)
Sodium: 140 mmol/L (ref 135–145)
Total Bilirubin: 0.3 mg/dL (ref 0.3–1.2)
Total Protein: 5.1 g/dL — ABNORMAL LOW (ref 6.5–8.1)

## 2019-10-19 LAB — CBC
HCT: 26.2 % — ABNORMAL LOW (ref 39.0–52.0)
Hemoglobin: 8.3 g/dL — ABNORMAL LOW (ref 13.0–17.0)
MCH: 29.7 pg (ref 26.0–34.0)
MCHC: 31.7 g/dL (ref 30.0–36.0)
MCV: 93.9 fL (ref 80.0–100.0)
Platelets: 203 10*3/uL (ref 150–400)
RBC: 2.79 MIL/uL — ABNORMAL LOW (ref 4.22–5.81)
RDW: 13.3 % (ref 11.5–15.5)
WBC: 4.6 10*3/uL (ref 4.0–10.5)
nRBC: 0 % (ref 0.0–0.2)

## 2019-10-19 LAB — SURGICAL PCR SCREEN
MRSA, PCR: NEGATIVE
Staphylococcus aureus: NEGATIVE

## 2019-10-19 LAB — ABO/RH: ABO/RH(D): AB POS

## 2019-10-19 LAB — TYPE AND SCREEN
ABO/RH(D): AB POS
Antibody Screen: NEGATIVE

## 2019-10-19 MED ORDER — SODIUM CHLORIDE 0.9 % IV SOLN
1.0000 g | INTRAVENOUS | Status: DC
  Administered 2019-10-19 – 2019-10-22 (×3): 1 g via INTRAVENOUS
  Filled 2019-10-19 (×2): qty 1
  Filled 2019-10-19 (×2): qty 10
  Filled 2019-10-19: qty 1

## 2019-10-19 MED ORDER — LIVING BETTER WITH HEART FAILURE BOOK
Freq: Once | Status: AC
  Filled 2019-10-19: qty 1

## 2019-10-19 MED ORDER — FUROSEMIDE 10 MG/ML IJ SOLN
40.0000 mg | Freq: Once | INTRAMUSCULAR | Status: AC
  Administered 2019-10-19: 40 mg via INTRAVENOUS
  Filled 2019-10-19: qty 4

## 2019-10-19 NOTE — Progress Notes (Signed)
PT Cancellation Note  Patient Details Name: Dennis Macias MRN: [id number] DOB: July 23, 1950   Cancelled Treatment:    Reason Eval/Treat Not Completed: Other (comment).  Pt would not respond to PT trying to talk with him, was not able to be seen today.  Follow up as time and pt permit given impending dc.   Ramond Dial 10/19/2019, 2:55 PM  Mee Hives, PT MS Acute Rehab Dept. Number: [place] [facility] [place]

## 2019-10-19 NOTE — Progress Notes (Signed)
Report called to Sammuel Hines, Therapist, sports at [facility].

## 2019-10-19 NOTE — Progress Notes (Addendum)
Subjective: HD 5 Overnight, no acute events reported.  This morning, patient evaluated at bedside. He reports improvement in his breathing following the thoracentesis and denies any chest pain.  Patient is in anticipation for his TURP procedure tomorrow.  All questions and concerns addressed.  Objective:  Vital signs in last 24 hours: Vitals:   10/18/19 1247 10/18/19 1824 10/18/19 2332 10/19/19 0531  BP: 129/64 131/69 134/67 138/74  Pulse: 68 66 69 68  Resp: 16 18 18 18   Temp: 98.4 F (36.9 C) 98.2 F (36.8 C) 98.7 F (37.1 C) 98.8 F (37.1 C)  TempSrc: Oral Oral Oral Oral  SpO2: 97% 98% 99% 99%  Weight:    71.3 kg  Height:       CBC Latest Ref Rng & Units 10/19/2019 10/18/2019 10/17/2019  WBC 4.0 - 10.5 K/uL 4.6 5.1 4.8  Hemoglobin 13.0 - 17.0 g/dL 8.3(L) 8.6(L) 8.6(L)  Hematocrit 39.0 - 52.0 % 26.2(L) 26.5(L) 26.2(L)  Platelets 150 - 400 K/uL 203 198 207   CMP Latest Ref Rng & Units 10/18/2019 10/18/2019 10/17/2019  Glucose 70 - 99 mg/dL 195(H) 114(H) 180(H)  BUN 8 - 23 mg/dL 42(H) 45(H) 44(H)  Creatinine 0.61 - 1.24 mg/dL 3.49(H) 3.52(H) 3.56(H)  Sodium 135 - 145 mmol/L 140 142 140  Potassium 3.5 - 5.1 mmol/L 3.8 4.2 4.2  Chloride 98 - 111 mmol/L 109 108 108  CO2 22 - 32 mmol/L 24 24 25   Calcium 8.9 - 10.3 mg/dL 8.4(L) 8.5(L) 8.5(L)  Total Protein 6.5 - 8.1 g/dL 5.1(L) 5.0(L) 5.3(L)  Total Bilirubin 0.3 - 1.2 mg/dL 0.6 0.5 0.2(L)  Alkaline Phos 38 - 126 U/L 48 51 46  AST 15 - 41 U/L 13(L) 13(L) 13(L)  ALT 0 - 44 U/L 11 10 11     Constitutional: Chronically ill-appearing male, no acute distress HEENT Head: NCAT Eyes: conjunctiva normal, EOMI normal Cardiovascular: RRR, no murmurs rubs or gallops; distal pulses intact Respiratory: Normal effort, no respiratory distress, no wheezing noted, fine crackles throughout, no rhonchi GI/Abdomen: Soft, nondistended, nontender, active bowel sounds Neurologic: Awake, alert and oriented x3, conversant, no apparent focal  deficits Musculoskeletal: Lower extremities without any significant edema noted Skin: No rashes or erythema   Assessment/Plan: Mr. Jansen Sciuto is a 69yr old male with PMHx of CKD-IV with nephrotic syndrome, hypertension, type 2 diabetes mellitus, BPH w/obstruction and indwelling foley catheter, hematuria and anemia presenting with shortness of breath in setting of bilateral pleural effusions.  Bilateral pleural effusions in setting of HFrEF exacerbation: Patient presented with few days of worsening dyspnea with bilateral pleural effusions on CXR. Noted to have elevated BNP. Echo with LVEF 40-45% with new apical kinesis. Cardiology consulted. Myoview did show anterior ischemia but given his CKD, patient is high risk for LHC at this time.  Patient has been diuresing with IV Lasix 80mg  bid and is -7kg and net -4.3L from admission. S/p bilateral thoracentesis (L on 4/9, R on 4/11). Breathing is significantly improved although continues to have some fine crackles throughout.  - Cardiology consulted, appreciate their recommendations  - Will decrease to IV Lasix 40mg  this PM - Continue BiDil 20-37.5mg  tid - Continue metoprolol succinate 50mg  daily - Continue atorvastatin 80mg  daily - BMP daily  - Strict I&O - Daily weights  CKD IV w/nephrotic syndrome: Patient has history of nephrotic syndrome with elevated protein:creatinine ratio on admission. sCr is stable and at baseline. He does have hypoalbuminemia on labs which could be contributing to third spacing and volume overload. Patient  has been receiving IV diuresis as above.  - Nephrology consulted, appreciate their recommendations - Monitor renal function - Avoid nephrotoxic agents  Normocytic anemia: Anemic on labs; however appears to be at baseline. Likely secondary to chronic renal disease. No active bleeding noted. - CBC daily  BPH w/indwelling foley:  TURP scheduled 4/13 with Dr. Jeffie Pollock. Tamsulosin and finasteride to be discontinued as  they may worsen CHF.  - Patient to be transferred to hospitalist service at [facility] for procedure; patient discussed with Dr. Andria Frames  - Will hold evening dose of subQ heparin  - TURP on 4/13  FEN/GI: Diet: Renal/CM Electrolytes: Monitor and replete prn Fluids: None  DVT Prophylaxis:Heparin Code status:FULL code   Prior to Admission Living Arrangement: Home Anticipated Discharge Location: Home w/HH vs SNF Barriers to Discharge:  Dispo: Anticipated discharge in approximately 1-2 day(s).   Harvie Heck, MD  Internal Medicine, PGY-1 10/19/2019, 6:50 AM Pager: [phone]

## 2019-10-19 NOTE — Progress Notes (Addendum)
Progress Note  Patient Name: Dennis Macias Date of Encounter: 10/19/2019  Primary Cardiologist: Larae Grooms, MD  Subjective   Feeling better overall - breathing improved. No CP. No further edema in legs.  Inpatient Medications    Scheduled Meds: . atorvastatin  80 mg Oral QHS  . Chlorhexidine Gluconate Cloth  6 each Topical Daily  . dorzolamide-timolol  1 drop Right Eye BID  . ferrous gluconate  324 mg Oral Q breakfast  . furosemide  80 mg Intravenous BID  . heparin  5,000 Units Subcutaneous Q8H  . isosorbide-hydrALAZINE  1 tablet Oral TID  . metoprolol succinate  50 mg Oral Daily  . sodium chloride flush  3 mL Intravenous Once  . vitamin B-12  1,000 mcg Oral Daily   Continuous Infusions: . cefTRIAXone (ROCEPHIN)  IV 1 g (10/19/19 0923)   PRN Meds: acetaminophen **OR** acetaminophen, technetium tetrofosmin   Vital Signs    Vitals:   10/18/19 1247 10/18/19 1824 10/18/19 2332 10/19/19 0531  BP: 129/64 131/69 134/67 138/74  Pulse: 68 66 69 68  Resp: 16 18 18 18   Temp: 98.4 F (36.9 C) 98.2 F (36.8 C) 98.7 F (37.1 C) 98.8 F (37.1 C)  TempSrc: Oral Oral Oral Oral  SpO2: 97% 98% 99% 99%  Weight:    71.3 kg  Height:        Intake/Output Summary (Last 24 hours) at 10/19/2019 1047 Last data filed at 10/19/2019 1009 Gross per 24 hour  Intake 582 ml  Output 1510 ml  Net -928 ml   Last 3 Weights 10/19/2019 10/18/2019 10/17/2019  Weight (lbs) 157 lb 3.2 oz 161 lb 1.6 oz 169 lb 8.5 oz  Weight (kg) 71.305 kg 73.074 kg 76.9 kg     Telemetry    NSR - Personally Reviewed  Physical Exam   GEN: No acute distress.  HEENT: Normocephalic, atraumatic, sclera non-icteric. Neck: No JVD or bruits. Cardiac: RRR no murmurs, rubs, or gallops.  Radials/DP/PT 1+ and equal bilaterally.  Respiratory: Coarse at bases with faint crackles bilaterally. Breathing is unlabored. GI: Soft, nontender, non-distended, BS +x 4. MS: no deformity. Extremities: No clubbing or cyanosis.  No edema. Distal pedal pulses are 2+ and equal bilaterally. Neuro:  AAOx3. Follows commands. Psych:  Responds to questions appropriately with a normal affect.  Labs    High Sensitivity Troponin:   Recent Labs  Lab 10/14/19 2259 10/15/19 0052  TROPONINIHS 20* 19*      Cardiac EnzymesNo results for input(s): TROPONINI in the last 168 hours. No results for input(s): TROPIPOC in the last 168 hours.   Chemistry Recent Labs  Lab 10/18/19 0454 10/18/19 1642 10/19/19 0553  NA 142 140 140  K 4.2 3.8 4.0  CL 108 109 107  CO2 24 24 23   GLUCOSE 114* 195* 112*  BUN 45* 42* 43*  CREATININE 3.52* 3.49* 3.52*  CALCIUM 8.5* 8.4* 8.4*  PROT 5.0* 5.1* 5.1*  ALBUMIN 2.4* 2.5* 2.4*  AST 13* 13* 12*  ALT 10 11 13   ALKPHOS 51 48 43  BILITOT 0.5 0.6 0.3  GFRNONAA 17* 17* 17*  GFRAA 19* 20* 19*  ANIONGAP 10 7 10      Hematology Recent Labs  Lab 10/17/19 0603 10/18/19 0454 10/19/19 0553  WBC 4.8 5.1 4.6  RBC 2.87* 2.90* 2.79*  HGB 8.6* 8.6* 8.3*  HCT 26.2* 26.5* 26.2*  MCV 91.3 91.4 93.9  MCH 30.0 29.7 29.7  MCHC 32.8 32.5 31.7  RDW 13.2 13.3 13.3  PLT 207 198  203    BNP Recent Labs  Lab 10/15/19 0610  BNP 1,820.0*     DDimer No results for input(s): DDIMER in the last 168 hours.   Radiology    DG Chest 1 View  Result Date: 10/18/2019 CLINICAL DATA:  Post thoracentesis. 1.5L off right side. Hx of diabetes, CVA, hypertension, tachycardia. Former smoker(1980). EXAM: CHEST  1 VIEW COMPARISON:  10/16/2019 FINDINGS: No pneumothorax. No evident residual right pleural effusion. Central pulmonary vascular congestion with some increase in interstitial opacities at the left lung base. Heart size and mediastinal contours are within normal limits. Aortic Atherosclerosis (ICD10-170.0). Vertebral endplate spurring at multiple levels in the mid and lower thoracic spine. IMPRESSION: No pneumothorax post right thoracentesis. Electronically Signed   By: Lucrezia Europe M.D.   On: 10/18/2019  11:34   NM Myocar Multi W/Spect W/Wall Motion / EF  Result Date: 10/17/2019 CLINICAL DATA:  Renal disease, abnormal EKG.  69 year old male EXAM: MYOCARDIAL IMAGING WITH SPECT (REST AND PHARMACOLOGIC-STRESS) GATED LEFT VENTRICULAR WALL MOTION STUDY LEFT VENTRICULAR EJECTION FRACTION TECHNIQUE: Standard myocardial SPECT imaging was performed after resting intravenous injection of 10 mCi Tc-63m tetrofosmin. Subsequently, intravenous infusion of Lexiscan was performed under the supervision of the Cardiology staff. At peak effect of the drug, 30 mCi Tc-50m tetrofosmin was injected intravenously and standard myocardial SPECT imaging was performed. Quantitative gated imaging was also performed to evaluate left ventricular wall motion, and estimate left ventricular ejection fraction. COMPARISON:  None. FINDINGS: Perfusion: There is a large region moderate to severe decreased perfusion involving the apex, apical segments and mid anterior septal wall and mid inferior wall. Decreased apical perfusion improves from stress to rest but does not normalize at the apex. Wall Motion: Global hypokinesia. LEFT ventricular dilatation. LEFT ventricular dilatation appears increased from rest to stress. Left Ventricular Ejection Fraction: 40 % End diastolic volume 326 ml End systolic volume 712 ml IMPRESSION: 1. Findings most consistent with LEFT ventricular apex infarction with peri-infarct ischemia most severe in the anterior wall but also involving the inferior wall. Overall large defect with moderate decreased perfusion. 2. Global hypokinesia.  Transient LEFT ventricular dilatation. 3. Left ventricular ejection fraction 40% 4. Non invasive risk stratification*: High *2012 Appropriate Use Criteria for Coronary Revascularization Focused Update: J Am Coll Cardiol. 4580;99(8):338-250. http://content.airportbarriers.com.aspx?articleid=[email or web address] Insert PRA call Electronically Signed   By: Suzy Bouchard M.D.   On: 10/17/2019 11:31    US THORACENTESIS ASP PLEURAL SPACE W/IMG GUIDE  Result Date: 10/18/2019 INDICATION: Patient history of chronic kidney disease with recurrent bilateral pleural effusions presents for right sided therapeutic thoracentesis EXAM: ULTRASOUND GUIDED THERAPEUTIC  THORACENTESIS MEDICATIONS: Lidocaine 1% 10 mL COMPLICATIONS: None immediate. PROCEDURE: An ultrasound guided thoracentesis was thoroughly discussed with the patient and questions answered. The benefits, risks, alternatives and complications were also discussed. The patient understands and wishes to proceed with the procedure. Written consent was obtained. Ultrasound was performed to localize and mark an adequate pocket of fluid in the right side chest. The area was then prepped and draped in the normal sterile fashion. 1% Lidocaine was used for local anesthesia. Under ultrasound guidance a 6 Fr Safe-T-Centesis catheter was introduced. Thoracentesis was performed. The catheter was removed and a dressing applied. FINDINGS: A total of approximately 1.5 L of straw-colored fluid was removed. IMPRESSION: Successful ultrasound guided therapeutic right-sided thoracentesis yielding 1.5 L of pleural fluid. Read by Rushie Nyhan NP Electronically Signed   By: Lucrezia Europe M.D.   On: 10/18/2019 11:37    Cardiac Studies   2D  echo 10/15/19 IMPRESSIONS    1. Left ventricular ejection fraction, by estimation, is 40 to 45%. The  left ventricle has mildly decreased function. The left ventricle  demonstrates regional wall motion abnormalities (see scoring  diagram/findings for description). There is mild  asymmetric left ventricular hypertrophy of the septal segment. Left  ventricular diastolic parameters are consistent with Grade I diastolic  dysfunction (impaired relaxation).  2. Right ventricular systolic function is normal. The right ventricular  size is normal. There is mildly elevated pulmonary artery systolic  pressure.  3. Left atrial size was  severely dilated.  4. Moderate pleural effusion.  5. The mitral valve is normal in structure. Mild mitral valve  regurgitation. No evidence of mitral stenosis.  6. The aortic valve is tricuspid. Aortic valve regurgitation is not  visualized. Mild to moderate aortic valve sclerosis/calcification is  present, without any evidence of aortic stenosis.  7. The inferior vena cava is normal in size with greater than 50%  respiratory variability, suggesting right atrial pressure of 3 mmHg.   Comparison(s): Changes from prior study are noted. The left ventricular  function is worsened.   Patient Profile     69 y.o. male with CKD stage IV 2/2 nephrotic syndrome, HTN, DM, CVA, BPH with indwelling foley, anasarca, blindness of right eye, pernicious anemia. He was recently admitted 09/2019 with anasarca and pleural effusion treated with diuresis and right thoracentesis. No echo at that time. GI was also consulted for his poor appetite, bowel wall thickening and liver/spleen iron depositions. Their impression was a low concern of the iron depositions and bowel wall thickening likely due to low serum protein. No specific recommendations from GI. He was readmitted with recurrent dyspnea, moderate left pleural effusion and lower extremity edema without chest pain. 2D echo 10/15/19 showed EF 40-45%, mild asymmetric LVH, grade 1 DD, mildly elevated PASP, severely dilated LA, moderate plueural effusion, normal IVC size, mild MR (previously normal EF in 2017). He underwent nuclear stress test 10/17/19 that was most consistent with LEFT ventricular apex infarction with peri-infarct ischemia most severe in the anterior wall but also involving the inferior wall, transient LV dilatation, EF 40%. Due to CKD stage IV and no chest pain, risk felt to outweight benefit so medical therapy recommended.  Assessment & Plan    1. Recurrent fluid overload with bilateral pleural effusions likely multifactorial from CKD stage IV with  nephrotic syndrome and acute combined CHF - has undergone bilateral thoracentesis this admission. Do not see that specimen was sent for labs. Last admission the pleural effusion labs showed reactive mesothelial cells and increased mononuclear cells only. IM, nephrology have also been following.   2. Acute combined CHF suspected due to ischemic cardiomyopathy with abnormal stress test - as above, medical therapy recommended as risk of LHC felt to outweigh benefit. Medications have been optimized this admission. Amlodipine was stopped secondary to LV dysfunction. Bidil was added for afterload reduction. Metoprolol was changed to succinate with titration. Diuresis-wise he is down 20lb but only 4.3L. If the patient is tolerating statin at time of follow-up appointment, would consider rechecking liver function/lipid panel in 6-8 weeks. Reviewed 2g sodium restriction and daily weights with patient. Will rx CHF education folder (he says he plans to involve his girlfriend in using this book to help him at home). Volume looks pretty good on exam aside from coarse BS at bases. Will review diuretic plan with MD, along with any recommendation to consider addition of aspirin (pending urologic surgery as well, chronic  anemia noted, hsTroponin low/flat at 20->19).  3. Pre-operative assessment for TURP - see note 4/11. No new recs regarding this.  4. CKD stage IV with nephrotic syndrome - nephrology has followed this admission. Albumin of 2.4 sure to be contributing to third spacing of fluid.  For questions or updates, please contact [place] Please consult [email or web address] for contact info under Cardiology/STEMI.  Signed, Charlie Pitter, PA-C 10/19/2019, 10:47 AM

## 2019-10-19 NOTE — Anesthesia Preprocedure Evaluation (Addendum)
Anesthesia Evaluation  Patient identified by MRN, date of birth, ID band Patient awake    Reviewed: Allergy & Precautions, NPO status , Patient's Chart, lab work & pertinent test results  Airway Mallampati: II  TM Distance: >3 FB Neck ROM: Full    Dental  (+) Edentulous Upper, Edentulous Lower   Pulmonary shortness of breath, former smoker,    Pulmonary exam normal breath sounds clear to auscultation       Cardiovascular hypertension, Pt. on medications +CHF  Normal cardiovascular exam Rhythm:Regular Rate:Normal  HLD  Stress Test 10/17/19 1. Findings most consistent with LEFT ventricular apex infarction with peri-infarct ischemia most severe in the anterior wall but also involving the inferior wall. Overall large defect with moderate decreased perfusion. 2. Global hypokinesia.  Transient LEFT ventricular dilatation. 3. Left ventricular ejection fraction 40% 4. Non invasive risk stratification*: High Per Cardiology, defer LHC at this time 2/2 risks of further kidney injury outweigh benefits   TTE 10/15/19 EF 40-45%, regional wall motion abnormalities present, G1DD, RV function normal, LA severely dilated, moderate pleural effusions, mild MR   Neuro/Psych CVA (2017) negative psych ROS   GI/Hepatic negative GI ROS, Neg liver ROS,   Endo/Other  negative endocrine ROSdiabetes  Renal/GU Renal InsufficiencyRenal disease (Cr 3.52, K 4.0)  negative genitourinary   Musculoskeletal  (+) Arthritis ,   Abdominal   Peds  Hematology  (+) Blood dyscrasia (Hgb 8.3), anemia ,   Anesthesia Other Findings Has been admitted to [facility] since 10/14/19 for CHF exacerbation. Symptoms have now improved, and pt has been cleared for surgery per Cardiology.   Reproductive/Obstetrics                            Anesthesia Physical Anesthesia Plan  ASA: IV  Anesthesia Plan: General   Post-op Pain Management:     Induction: Intravenous  PONV Risk Score and Plan: 2 and Ondansetron, Dexamethasone and Treatment may vary due to age or medical condition  Airway Management Planned: LMA and Oral ETT  Additional Equipment:   Intra-op Plan:   Post-operative Plan: Extubation in OR  Informed Consent: I have reviewed the patients History and Physical, chart, labs and discussed the procedure including the risks, benefits and alternatives for the proposed anesthesia with the patient or authorized representative who has indicated his/her understanding and acceptance.     Dental advisory given  Plan Discussed with: CRNA  Anesthesia Plan Comments:         Anesthesia Quick Evaluation

## 2019-10-19 NOTE — Progress Notes (Addendum)
[facility] called to come pick up patient.   Report given to [facility].

## 2019-10-19 NOTE — Progress Notes (Signed)
[facility] here to pick up patient.

## 2019-10-19 NOTE — Progress Notes (Signed)
Subjective: Dennis Macias is recovering from treatment of his CHF exacerbation requiring diuresis and thoracentesis.  He has a stable anemia and CKD 4.  His urine is clear.  He has some penile irritation with the foley.  He is on the schedule for a TURP in the morning at [facility].  ROS:  Review of Systems  Constitutional: Negative for fever.  Respiratory: Negative for shortness of breath.   Cardiovascular: Negative for chest pain and leg swelling.  Genitourinary:       He has some penile pain and catheter irritation.     Anti-infectives: Anti-infectives (From admission, onward)   Start     Dose/Rate Route Frequency Ordered Stop   10/19/19 0733  cefTRIAXone (ROCEPHIN) 1 g in sodium chloride 0.9 % 100 mL IVPB     1 g 200 mL/hr over 30 Minutes Intravenous Every 24 hours 10/19/19 0734        Current Facility-Administered Medications  Medication Dose Route Frequency Provider Last Rate Last Admin  . acetaminophen (TYLENOL) tablet 650 mg  650 mg Oral Q6H PRN Kathi Ludwig, MD       Or  . acetaminophen (TYLENOL) suppository 650 mg  650 mg Rectal Q6H PRN Kathi Ludwig, MD      . atorvastatin (LIPITOR) tablet 80 mg  80 mg Oral QHS Kathi Ludwig, MD   80 mg at 10/18/19 2228  . cefTRIAXone (ROCEPHIN) 1 g in sodium chloride 0.9 % 100 mL IVPB  1 g Intravenous Q24H Irine Seal, MD      . Chlorhexidine Gluconate Cloth 2 % PADS 6 each  6 each Topical Daily Velna Ochs, MD   6 each at 10/18/19 0854  . dorzolamide-timolol (COSOPT) 22.3-6.8 MG/ML ophthalmic solution 1 drop  1 drop Right Eye BID Kathi Ludwig, MD   1 drop at 10/18/19 2228  . ferrous gluconate (FERGON) tablet 324 mg  324 mg Oral Q breakfast Kathi Ludwig, MD   324 mg at 10/18/19 0854  . furosemide (LASIX) injection 80 mg  80 mg Intravenous BID Harvie Heck, MD   80 mg at 10/18/19 1306  . heparin injection 5,000 Units  5,000 Units Subcutaneous Q8H Kathi Ludwig, MD   5,000 Units at 10/19/19 0525  .  isosorbide-hydrALAZINE (BIDIL) 20-37.5 MG per tablet 1 tablet  1 tablet Oral TID Josue Hector, MD   1 tablet at 10/18/19 2228  . metoprolol succinate (TOPROL-XL) 24 hr tablet 50 mg  50 mg Oral Daily Skeet Latch, MD   50 mg at 10/18/19 0854  . sodium chloride flush (NS) 0.9 % injection 3 mL  3 mL Intravenous Once Mesner, Corene Cornea, MD      . technetium tetrofosmin (TC-MYOVIEW) injection 30 millicurie  30 millicurie Intravenous Once PRN Leonia Reeves, J, MD      . vitamin B-12 (CYANOCOBALAMIN) tablet 1,000 mcg  1,000 mcg Oral Daily Kathi Ludwig, MD   1,000 mcg at 10/18/19 0854     Objective: Vital signs in last 24 hours: Temp:  [98.2 F (36.8 C)-98.8 F (37.1 C)] 98.8 F (37.1 C) (04/12 0531) Pulse Rate:  [66-69] 68 (04/12 0531) Resp:  [16-18] 18 (04/12 0531) BP: (129-138)/(64-74) 138/74 (04/12 0531) SpO2:  [97 %-99 %] 99 % (04/12 0531) Weight:  [71.3 kg] 71.3 kg (04/12 0531)  Intake/Output from previous day: 04/11 0701 - 04/12 0700 In: -  Out: 2536 [Urine:1510] Intake/Output this shift: No intake/output data recorded.   Physical Exam Vitals reviewed.  Constitutional:      Appearance: He is well-developed.  Cardiovascular:     Rate and Rhythm: Normal rate and regular rhythm.  Pulmonary:     Effort: Pulmonary effort is normal.     Breath sounds: Normal breath sounds.  Musculoskeletal:        General: Normal range of motion.     Right lower leg: No edema.     Left lower leg: No edema.  Neurological:     Mental Status: He is alert.     Lab Results:  Recent Labs    10/18/19 0454 10/19/19 0553  WBC 5.1 4.6  HGB 8.6* 8.3*  HCT 26.5* 26.2*  PLT 198 203   BMET Recent Labs    10/18/19 1642 10/19/19 0553  NA 140 140  K 3.8 4.0  CL 109 107  CO2 24 23  GLUCOSE 195* 112*  BUN 42* 43*  CREATININE 3.49* 3.52*  CALCIUM 8.4* 8.4*   PT/INR No results for input(s): LABPROT, INR in the last 72 hours. ABG No results for input(s): PHART, HCO3 in the last 72  hours.  Invalid input(s): PCO2, PO2  Studies/Results: DG Chest 1 View  Result Date: 10/18/2019 CLINICAL DATA:  Post thoracentesis. 1.5L off right side. Hx of diabetes, CVA, hypertension, tachycardia. Former smoker(1980). EXAM: CHEST  1 VIEW COMPARISON:  10/16/2019 FINDINGS: No pneumothorax. No evident residual right pleural effusion. Central pulmonary vascular congestion with some increase in interstitial opacities at the left lung base. Heart size and mediastinal contours are within normal limits. Aortic Atherosclerosis (ICD10-170.0). Vertebral endplate spurring at multiple levels in the mid and lower thoracic spine. IMPRESSION: No pneumothorax post right thoracentesis. Electronically Signed   By: Lucrezia Europe M.D.   On: 10/18/2019 11:34   NM Myocar Multi W/Spect W/Wall Motion / EF  Result Date: 10/17/2019 CLINICAL DATA:  Renal disease, abnormal EKG.  69 year old male EXAM: MYOCARDIAL IMAGING WITH SPECT (REST AND PHARMACOLOGIC-STRESS) GATED LEFT VENTRICULAR WALL MOTION STUDY LEFT VENTRICULAR EJECTION FRACTION TECHNIQUE: Standard myocardial SPECT imaging was performed after resting intravenous injection of 10 mCi Tc-55m tetrofosmin. Subsequently, intravenous infusion of Lexiscan was performed under the supervision of the Cardiology staff. At peak effect of the drug, 30 mCi Tc-57m tetrofosmin was injected intravenously and standard myocardial SPECT imaging was performed. Quantitative gated imaging was also performed to evaluate left ventricular wall motion, and estimate left ventricular ejection fraction. COMPARISON:  None. FINDINGS: Perfusion: There is a large region moderate to severe decreased perfusion involving the apex, apical segments and mid anterior septal wall and mid inferior wall. Decreased apical perfusion improves from stress to rest but does not normalize at the apex. Wall Motion: Global hypokinesia. LEFT ventricular dilatation. LEFT ventricular dilatation appears increased from rest to  stress. Left Ventricular Ejection Fraction: 40 % End diastolic volume 893 ml End systolic volume 810 ml IMPRESSION: 1. Findings most consistent with LEFT ventricular apex infarction with peri-infarct ischemia most severe in the anterior wall but also involving the inferior wall. Overall large defect with moderate decreased perfusion. 2. Global hypokinesia.  Transient LEFT ventricular dilatation. 3. Left ventricular ejection fraction 40% 4. Non invasive risk stratification*: High *2012 Appropriate Use Criteria for Coronary Revascularization Focused Update: J Am Coll Cardiol. 1751;02(5):852-778. http://content.airportbarriers.com.aspx?articleid=[email or web address] Insert PRA call Electronically Signed   By: Suzy Bouchard M.D.   On: 10/17/2019 11:31   US THORACENTESIS ASP PLEURAL SPACE W/IMG GUIDE  Result Date: 10/18/2019 INDICATION: Patient history of chronic kidney disease with recurrent bilateral pleural effusions presents for right sided therapeutic thoracentesis EXAM: ULTRASOUND GUIDED THERAPEUTIC  THORACENTESIS MEDICATIONS: Lidocaine 1% 10  mL COMPLICATIONS: None immediate. PROCEDURE: An ultrasound guided thoracentesis was thoroughly discussed with the patient and questions answered. The benefits, risks, alternatives and complications were also discussed. The patient understands and wishes to proceed with the procedure. Written consent was obtained. Ultrasound was performed to localize and mark an adequate pocket of fluid in the right side chest. The area was then prepped and draped in the normal sterile fashion. 1% Lidocaine was used for local anesthesia. Under ultrasound guidance a 6 Fr Safe-T-Centesis catheter was introduced. Thoracentesis was performed. The catheter was removed and a dressing applied. FINDINGS: A total of approximately 1.5 L of straw-colored fluid was removed. IMPRESSION: Successful ultrasound guided therapeutic right-sided thoracentesis yielding 1.5 L of pleural fluid. Read by Rushie Nyhan NP Electronically Signed   By: Lucrezia Europe M.D.   On: 10/18/2019 11:37     Assessment and Plan: BPH with Retention.   He remains on the schedule for a TURP in the morning and has been medically cleared.  I confirmed with the patient that he is at increased risk for complications but he would like to proceed.   I will go ahead and give him rocephin today since the foley has been chronic.     CHF.  I will go ahead and stop the tamsulosin and finasteride since they will not be needed after the TURP and they can aggravate CHF.    Anemia.  He has a stable low H&H.   I will go ahead and type and screen him to be prepared should he have bleeding with the TURP.    I have requested that he be transferred to the hospitalist service at [facility] today in preparation for the procedure tomorrow.        LOS: 4 days    Irine Seal 10/19/[id number] ID: Dennis Macias, male   DOB: 27-Sep-1950, 69 y.o.   MRN: [id number]

## 2019-10-20 ENCOUNTER — Inpatient Hospital Stay (HOSPITAL_COMMUNITY): Payer: Medicare PPO | Admitting: Physician Assistant

## 2019-10-20 ENCOUNTER — Encounter (HOSPITAL_COMMUNITY)
Admission: EM | Disposition: A | Discharge status: Home Health | Payer: Self-pay | Source: Home / Self Care | Attending: Internal Medicine

## 2019-10-20 ENCOUNTER — Inpatient Hospital Stay (HOSPITAL_COMMUNITY): Payer: Medicare PPO | Admitting: Anesthesiology

## 2019-10-20 ENCOUNTER — Encounter (HOSPITAL_COMMUNITY): Payer: Self-pay | Admitting: Internal Medicine

## 2019-10-20 ENCOUNTER — Ambulatory Visit (HOSPITAL_COMMUNITY): Admission: RE | Admit: 2019-10-20 | Payer: Medicare PPO | Source: Ambulatory Visit | Admitting: Urology

## 2019-10-20 DIAGNOSIS — J90 Pleural effusion, not elsewhere classified: Secondary | ICD-10-CM | POA: Diagnosis not present

## 2019-10-20 DIAGNOSIS — R338 Other retention of urine: Secondary | ICD-10-CM | POA: Diagnosis not present

## 2019-10-20 DIAGNOSIS — N401 Enlarged prostate with lower urinary tract symptoms: Secondary | ICD-10-CM | POA: Diagnosis not present

## 2019-10-20 DIAGNOSIS — N320 Bladder-neck obstruction: Secondary | ICD-10-CM | POA: Diagnosis not present

## 2019-10-20 HISTORY — PX: TRANSURETHRAL RESECTION OF PROSTATE: SHX73

## 2019-10-20 LAB — CBC
HCT: 26.8 % — ABNORMAL LOW (ref 39.0–52.0)
Hemoglobin: 8.6 g/dL — ABNORMAL LOW (ref 13.0–17.0)
MCH: 30.2 pg (ref 26.0–34.0)
MCHC: 32.1 g/dL (ref 30.0–36.0)
MCV: 94 fL (ref 80.0–100.0)
Platelets: 201 10*3/uL (ref 150–400)
RBC: 2.85 MIL/uL — ABNORMAL LOW (ref 4.22–5.81)
RDW: 13.4 % (ref 11.5–15.5)
WBC: 4.6 10*3/uL (ref 4.0–10.5)
nRBC: 0 % (ref 0.0–0.2)

## 2019-10-20 LAB — COMPREHENSIVE METABOLIC PANEL
ALT: 12 U/L (ref 0–44)
AST: 12 U/L — ABNORMAL LOW (ref 15–41)
Albumin: 2.7 g/dL — ABNORMAL LOW (ref 3.5–5.0)
Alkaline Phosphatase: 42 U/L (ref 38–126)
Anion gap: 8 (ref 5–15)
BUN: 47 mg/dL — ABNORMAL HIGH (ref 8–23)
CO2: 24 mmol/L (ref 22–32)
Calcium: 8.5 mg/dL — ABNORMAL LOW (ref 8.9–10.3)
Chloride: 107 mmol/L (ref 98–111)
Creatinine, Ser: 3.45 mg/dL — ABNORMAL HIGH (ref 0.61–1.24)
GFR calc Af Amer: 20 mL/min — ABNORMAL LOW (ref >60)
GFR calc non Af Amer: 17 mL/min — ABNORMAL LOW (ref >60)
Glucose, Bld: 112 mg/dL — ABNORMAL HIGH (ref 70–99)
Potassium: 4 mmol/L (ref 3.5–5.1)
Sodium: 139 mmol/L (ref 135–145)
Total Bilirubin: 0.4 mg/dL (ref 0.3–1.2)
Total Protein: 5.1 g/dL — ABNORMAL LOW (ref 6.5–8.1)

## 2019-10-20 LAB — GLUCOSE, CAPILLARY: Glucose-Capillary: 95 mg/dL (ref 70–99)

## 2019-10-20 LAB — TYPE AND SCREEN
ABO/RH(D): AB POS
Antibody Screen: NEGATIVE

## 2019-10-20 LAB — ABO/RH: ABO/RH(D): AB POS

## 2019-10-20 SURGERY — TURP (TRANSURETHRAL RESECTION OF PROSTATE)
Anesthesia: General

## 2019-10-20 MED ORDER — ACETAMINOPHEN 500 MG PO TABS
ORAL_TABLET | ORAL | Status: AC
  Administered 2019-10-20: 1000 mg via ORAL
  Filled 2019-10-20: qty 2

## 2019-10-20 MED ORDER — ACETAMINOPHEN 500 MG PO TABS: 1000.0000 mg | ORAL_TABLET | Freq: Once | ORAL | Status: AC

## 2019-10-20 MED ORDER — FUROSEMIDE 40 MG PO TABS: 40.0000 mg | ORAL_TABLET | Freq: Two times a day (BID) | ORAL | Status: DC

## 2019-10-20 MED ORDER — MIDAZOLAM HCL 5 MG/5ML IJ SOLN
INTRAMUSCULAR | Status: DC | PRN
  Administered 2019-10-20: 1 mg via INTRAVENOUS

## 2019-10-20 MED ORDER — SODIUM CHLORIDE 0.9 % IV SOLN
INTRAVENOUS | Status: AC
  Filled 2019-10-20: qty 20

## 2019-10-20 MED ORDER — SODIUM CHLORIDE 0.9 % IR SOLN
Status: DC | PRN
  Administered 2019-10-20: 1000 mL
  Administered 2019-10-20: 21000 mL

## 2019-10-20 MED ORDER — FENTANYL CITRATE (PF) 100 MCG/2ML IJ SOLN
INTRAMUSCULAR | Status: DC | PRN
  Administered 2019-10-20: 50 ug via INTRAVENOUS
  Administered 2019-10-20 (×2): 25 ug via INTRAVENOUS

## 2019-10-20 MED ORDER — FUROSEMIDE 40 MG PO TABS
80.0000 mg | ORAL_TABLET | Freq: Every day | ORAL | Status: DC
  Administered 2019-10-20 – 2019-10-22 (×3): 80 mg via ORAL
  Filled 2019-10-20 (×4): qty 2

## 2019-10-20 MED ORDER — SODIUM CHLORIDE 0.9 % IV SOLN
2.0000 g | INTRAVENOUS | Status: AC
  Administered 2019-10-20: 2 g via INTRAVENOUS

## 2019-10-20 MED ORDER — FENTANYL CITRATE (PF) 100 MCG/2ML IJ SOLN: 25.0000 ug | INTRAMUSCULAR | Status: DC | PRN

## 2019-10-20 MED ORDER — LACTATED RINGERS IV SOLN: INTRAVENOUS | Status: DC

## 2019-10-20 MED ORDER — ONDANSETRON HCL 4 MG/2ML IJ SOLN
INTRAMUSCULAR | Status: DC | PRN
  Administered 2019-10-20: 4 mg via INTRAVENOUS

## 2019-10-20 MED ORDER — LIDOCAINE HCL 1 % IJ SOLN
INTRAMUSCULAR | Status: DC | PRN
  Administered 2019-10-20: 40 mg via INTRADERMAL

## 2019-10-20 MED ORDER — PROPOFOL 10 MG/ML IV BOLUS
INTRAVENOUS | Status: DC | PRN
  Administered 2019-10-20: 110 mg via INTRAVENOUS

## 2019-10-20 SURGICAL SUPPLY — 17 items
BAG URINE DRAIN 2000ML AR STRL (UROLOGICAL SUPPLIES) ×1 IMPLANT
BAG URO CATCHER STRL LF (MISCELLANEOUS) ×2 IMPLANT
CATH FOLEY 3WAY 30CC 22FR (CATHETERS) ×1 IMPLANT
ELECT REM PT RETURN 15FT ADLT (MISCELLANEOUS) ×1 IMPLANT
GLOVE SURG SS PI 8.0 STRL IVOR (GLOVE) ×1 IMPLANT
GOWN STRL REUS W/TWL XL LVL3 (GOWN DISPOSABLE) ×2 IMPLANT
HOLDER FOLEY CATH W/STRAP (MISCELLANEOUS) ×1 IMPLANT
KIT TURNOVER KIT A (KITS) IMPLANT
LOOP CUT BIPOLAR 24F LRG (ELECTROSURGICAL) ×1 IMPLANT
MANIFOLD NEPTUNE II (INSTRUMENTS) ×2 IMPLANT
PACK CYSTO (CUSTOM PROCEDURE TRAY) ×2 IMPLANT
PENCIL SMOKE EVACUATOR (MISCELLANEOUS) IMPLANT
SYR 30ML LL (SYRINGE) ×1 IMPLANT
SYR TOOMEY IRRIG 70ML (MISCELLANEOUS) ×2
SYRINGE TOOMEY IRRIG 70ML (MISCELLANEOUS) IMPLANT
TUBING CONNECTING 10 (TUBING) ×2 IMPLANT
TUBING UROLOGY SET (TUBING) ×2 IMPLANT

## 2019-10-20 NOTE — Care Management Important Message (Signed)
Important Message  Patient Details IM Letter given to Dessa Phi RN Case Manager to present to the Patient Name: Boen Sterbenz MRN: [id number] Date of Birth: [birth year]   Medicare Important Message Given:  Yes     Kerin Salen 10/20/2019, 10:28 AM

## 2019-10-20 NOTE — Op Note (Addendum)
Preoperative diagnosis:  1. Bladder outlet obstruction 2. Urinary retention  Postoperative diagnosis:  1. Same  Procedure:  1. Transurethral resection of prostate  Surgeon: Irine Seal, MD   Anesthesia: General   Complications: None   Intraoperative findings: Cystoscopy demonstrated mild trabeculation without tumor/stones/or abnormalities.  UOs were normal anatomic position  Prostate demonstrated severe right lateral lobe hypertrophy, mild left lateral lobe hypertrophy and small median lobe with obstruction.   EBL: 50 cc   Specimens: Prostate chips  Indication: Dennis Macias is a 69 y.o. patient with bladder outlet obstruction and urinary retention.  After reviewing the management options for treatment, he elected to proceed with the above surgical procedure(s). We have discussed the potential benefits and risks of the procedure, side effects of the proposed treatment, the likelihood of the patient achieving the goals of the procedure, and any potential problems that might occur during the procedure or recuperation. Informed consent has been obtained.  Description of procedure:  The patient was taken to the operating room and general anesthesia was induced. The patient was placed in the dorsal lithotomy position, prepped and draped in the usual sterile fashion, and preoperative antibiotics were administered. A preoperative time-out was performed.   We gently passed a rigid cystoscope into the patient's urethra and up in the bladder under visual guidance.  Cystoscopy was performed with the above-noted findings.  We then remove the rigid cystoscope and replaced it with a 26 French resectoscope using the visual obturator.  We exchanged the visual obturator for the resectoscope and loop cautery.  We then proceeded to resect adenoma in a circumferential manner starting with the lateral lobes and finishing with the posterior wall.  Careful attention was paid to not carry resection past the level of  the verumontanum.  Resected until we were down to prostate capsule.  Hemostasis was achieved during resection as well as at the end of the resection.  We confirmed via direct visualization that enough prostate had been resected to ensure clear passage of urine.  We evacuated the prostate chips using a Toomey syringe.  We emptied the bladder and reinserted the resectoscope to ensure adequate hemostasis.  We then placed a 22 French three-way Foley catheter.  We irrigated this catheter gently with return of clear fluid with no clots or chips.  We then placed the patient on gentle continuous bladder irrigation.  He subsequently was extubated and returned to PACU in excellent condition.  Post op plan: Continue CBI overnight; if does well, then will clamp CBI in morning and consider catheter removal.

## 2019-10-20 NOTE — Interval H&P Note (Signed)
History and Physical Interval Note:  No change.   I will proceed with the TURP.  10/20/2019 7:23 AM  Dennis Macias  has presented today for surgery, with the diagnosis of [place].  The various methods of treatment have been discussed with the patient and family. After consideration of risks, benefits and other options for treatment, the patient has consented to  Procedure(s): TRANSURETHRAL RESECTION OF THE PROSTATE (TURP) (N/A) as a surgical intervention.  The patient's history has been reviewed, patient examined, no change in status, stable for surgery.  I have reviewed the patient's chart and labs.  Questions were answered to the patient's satisfaction.     Irine Seal

## 2019-10-20 NOTE — Progress Notes (Addendum)
PROGRESS NOTE    Dennis Macias  YSA:[id number] DOB: October 27, 1950 DOA: 10/14/2019 PCP: [name], MD   Brief Narrative: 69 year old with past medical history significant for CKD stage IV with nephrotic syndrome, hypertension, diabetes type 2, BPH with obstruction and indwelling Foley catheter, hematuria and anemia who presents with shortness of breath in the setting of bilateral pleural effusion. Patient was admitted by the teaching service at [facility], for worsening shortness of breath and respiratory distress.  He underwent thoracentesis on 10/16/2019 left side thoracentesis yielding 1.3 L of clear fluid.  5 L from the right on 4/10.  Patient was treated with IV Lasix.. Cardiology was consulted and cleared patient for TURP procedure.  They recommended to stop Norvasc due to-year-old lower ejection fraction.  Patient was a started on BiDil. Nephrology was consulted and has signed off. Patient underwent TURP procedure today on 4/13.    Assessment & Plan:   Active Problems:   BPH with urinary obstruction   CKD (chronic kidney disease) stage 4, GFR 15-29 ml/min (HCC)   Nephrotic syndrome   Pleural effusion   Dyspnea   Cardiorenal syndrome  1-acute systolic heart failure exacerbation, bilateral pleural effusion: -Patient underwent bilateral thoracentesis. He had removed  1.3 and 1.5 L -He was treated with IV Laxis 80 mg IV twice daily. -Patient was a started on BiDil and metoprolol changed to succinate. -Plan to transition to 80 mg lasix today.  Monitor volume status and adjust as needed. -Resume baby aspirin when okay urg.  2-CKD stage IV with nephrotic syndrome, Patient was treated with IV Lasix. Continue to monitor urine output. Plan to start 80 mg of Lasix daily Creatinine range 3.6----3.2. Stable  3-: Normocytic anemia; chronic disease Monitor hemoglobin level  4-BPH with chronic indwelling Foley catheter: Patient underwent TURP procedure today by Dr. Roni Bread.   Appears stable post op.  Estimated body mass index is 21.32 kg/m as calculated from the following:   Height as of this encounter: 6' (1.829 m).   Weight as of this encounter: 71.3 kg.   DVT prophylaxis: Resume anticoagulation when okay by urology Code Status: Full code Family Communication: Care discussed with patient Disposition Plan:  Patient is from: Home Anticipated d/c date: Home versus SNF awaiting PT eval Barriers to d/c or necessity for inpatient status: Patient is s/p op day 1  Consultants:   Urology  Cardiology  Nephrology   Procedures:  TURP Antimicrobials:    Subjective: He is alert, eating breakfast, denies abdominal pain.  He is breathing well.   Objective: Vitals:   10/20/19 0915 10/20/19 0930 10/20/19 0948 10/20/19 1454  BP: 132/71 133/69 138/74 139/70  Pulse: 61 (!) 59 62 (!) 53  Resp: 12 13 14 14   Temp:   97.9 F (36.6 C) 97.8 F (36.6 C)  TempSrc:   Oral Oral  SpO2: 100% 99% 100% 98%  Weight:      Height:        Intake/Output Summary (Last 24 hours) at 10/20/2019 1503 Last data filed at 10/20/2019 1450 Gross per 24 hour  Intake 2650 ml  Output 1905 ml  Net 745 ml   Filed Weights   10/18/19 0503 10/19/19 0531 10/20/19 0650  Weight: 73.1 kg 71.3 kg 71.3 kg    Examination:  General exam: Appears calm and comfortable  Respiratory system: Clear to auscultation. Respiratory effort normal. Cardiovascular system: S1 & S2 heard, RRR. No JVD, murmurs, rubs, gallops or clicks. No pedal edema. Gastrointestinal system: Abdomen is nondistended, soft and  nontender. No organomegaly or masses felt. Normal bowel sounds heard. Central nervous system: Alert and oriented. Extremities: Symmetric 5 x 5 power.    Data Reviewed: I have personally reviewed following labs and imaging studies  CBC: Recent Labs  Lab 10/16/19 0107 10/17/19 0603 10/18/19 0454 10/19/19 0553 10/20/19 0503  WBC 4.1 4.8 5.1 4.6 4.6  HGB 9.2* 8.6* 8.6* 8.3* 8.6*  HCT  28.6* 26.2* 26.5* 26.2* 26.8*  MCV 92.6 91.3 91.4 93.9 94.0  PLT 223 207 198 203 371   Basic Metabolic Panel: Recent Labs  Lab 10/15/19 0851 10/16/19 0107 10/17/19 1647 10/18/19 0454 10/18/19 1642 10/19/19 0553 10/20/19 0503  NA  --    < > 140 142 140 140 139  K  --    < > 4.2 4.2 3.8 4.0 4.0  CL  --    < > 108 108 109 107 107  CO2  --    < > 25 24 24 23 24   GLUCOSE  --    < > 180* 114* 195* 112* 112*  BUN  --    < > 44* 45* 42* 43* 47*  CREATININE  --    < > 3.56* 3.52* 3.49* 3.52* 3.45*  CALCIUM  --    < > 8.5* 8.5* 8.4* 8.4* 8.5*  MG 1.9  --   --   --   --   --   --    < > = values in this interval not displayed.   GFR: Estimated Creatinine Clearance: 20.7 mL/min (A) (by C-G formula based on SCr of 3.45 mg/dL (H)). Liver Function Tests: Recent Labs  Lab 10/17/19 1647 10/18/19 0454 10/18/19 1642 10/19/19 0553 10/20/19 0503  AST 13* 13* 13* 12* 12*  ALT 11 10 11 13 12   ALKPHOS 46 51 48 43 42  BILITOT 0.2* 0.5 0.6 0.3 0.4  PROT 5.3* 5.0* 5.1* 5.1* 5.1*  ALBUMIN 2.4* 2.4* 2.5* 2.4* 2.7*   No results for input(s): LIPASE, AMYLASE in the last 168 hours. No results for input(s): AMMONIA in the last 168 hours. Coagulation Profile: Recent Labs  Lab 10/15/19 0851  INR 1.5*   Cardiac Enzymes: No results for input(s): CKTOTAL, CKMB, CKMBINDEX, TROPONINI in the last 168 hours. BNP (last 3 results) No results for input(s): PROBNP in the last 8760 hours. HbA1C: No results for input(s): HGBA1C in the last 72 hours. CBG: Recent Labs  Lab 10/20/19 0902  GLUCAP 95   Lipid Profile: No results for input(s): CHOL, HDL, LDLCALC, TRIG, CHOLHDL, LDLDIRECT in the last 72 hours. Thyroid Function Tests: No results for input(s): TSH, T4TOTAL, FREET4, T3FREE, THYROIDAB in the last 72 hours. Anemia Panel: No results for input(s): VITAMINB12, FOLATE, FERRITIN, TIBC, IRON, RETICCTPCT in the last 72 hours. Sepsis Labs: No results for input(s): PROCALCITON, LATICACIDVEN in the  last 168 hours.  Recent Results (from the past 240 hour(s))  SARS CORONAVIRUS 2 (TAT 6-24 HRS) Nasopharyngeal Nasopharyngeal Swab     Status: None   Collection Time: 10/15/19  6:35 AM   Specimen: Nasopharyngeal Swab  Result Value Ref Range Status   SARS Coronavirus 2 NEGATIVE NEGATIVE Final    Comment: (NOTE) SARS-CoV-2 target nucleic acids are NOT DETECTED. The SARS-CoV-2 RNA is generally detectable in upper and lower respiratory specimens during the acute phase of infection. Negative results do not preclude SARS-CoV-2 infection, do not rule out co-infections with other pathogens, and should not be used as the sole basis for treatment or other patient management decisions. Negative results  must be combined with clinical observations, patient history, and epidemiological information. The expected result is Negative. Fact Sheet for Patients: SugarRoll.be Fact Sheet for Healthcare Providers: https://www.woods-mathews.com/ This test is not yet approved or cleared by the [place] FDA and  has been authorized for detection and/or diagnosis of SARS-CoV-2 by FDA under an Emergency Use Authorization (EUA). This EUA will remain  in effect (meaning this test can be used) for the duration of the COVID-19 declaration under Section 56 4(b)(1) of the Act, 21 U.S.C. section 360bbb-3(b)(1), unless the authorization is terminated or revoked sooner. Performed at [facility], [place] [address], [place], [place] [address]   Surgical pcr screen     Status: None   Collection Time: 10/19/19  6:55 PM   Specimen: Nasal Mucosa; Nasal Swab  Result Value Ref Range Status   MRSA, PCR NEGATIVE NEGATIVE Final   Staphylococcus aureus NEGATIVE NEGATIVE Final    Comment: (NOTE) The Xpert SA Assay (FDA approved for NASAL specimens in patients 54 years of age and older), is one component of a comprehensive surveillance program. It is not intended to diagnose  infection nor to guide or monitor treatment. Performed at [facility], [place] [address], [place], [place] [address]          Radiology Studies: No results found.      Scheduled Meds: . atorvastatin  80 mg Oral QHS  . Chlorhexidine Gluconate Cloth  6 each Topical Daily  . dorzolamide-timolol  1 drop Right Eye BID  . ferrous gluconate  324 mg Oral Q breakfast  . isosorbide-hydrALAZINE  1 tablet Oral TID  . metoprolol succinate  50 mg Oral Daily  . sodium chloride flush  3 mL Intravenous Once  . vitamin B-12  1,000 mcg Oral Daily   Continuous Infusions: . cefTRIAXone (ROCEPHIN)  IV 1 g (10/19/19 0923)     LOS: 5 days    Time spent: 35 minutes.     Elmarie Shiley, MD [facility]   If 7PM-7AM, please contact night-coverage [email or web address]  10/20/2019, 3:03 PM

## 2019-10-20 NOTE — Anesthesia Postprocedure Evaluation (Signed)
Anesthesia Post Note  Patient: Dennis Macias  Procedure(s) Performed: TRANSURETHRAL RESECTION OF THE PROSTATE (TURP) (N/A )     Patient location during evaluation: PACU Anesthesia Type: General Level of consciousness: awake and alert Pain management: pain level controlled Vital Signs Assessment: post-procedure vital signs reviewed and stable Respiratory status: spontaneous breathing, nonlabored ventilation, respiratory function stable and patient connected to nasal cannula oxygen Cardiovascular status: blood pressure returned to baseline and stable Postop Assessment: no apparent nausea or vomiting Anesthetic complications: no    Last Vitals:  Vitals:   10/20/19 0930 10/20/19 0948  BP: 133/69 138/74  Pulse: (!) 59 62  Resp: 13 14  Temp:  36.6 C  SpO2: 99% 100%    Last Pain:  Vitals:   10/20/19 0948  TempSrc: Oral  PainSc:                  Stark Aguinaga L Nickalus [place]

## 2019-10-20 NOTE — Progress Notes (Signed)
PT Cancellation Note  Patient Details Name: Dennis Macias MRN: [id number] DOB: May 11, 1951   Cancelled Treatment:    Reason Eval/Treat Not Completed: Other (comment). S/p TURP today, defer at this time and continue efforts.    [facility] 10/20/2019, 3:10 PM

## 2019-10-20 NOTE — Telephone Encounter (Signed)
The MRI Cervical spine is still pending it has been sent to the MD review.

## 2019-10-20 NOTE — Transfer of Care (Signed)
Immediate Anesthesia Transfer of Care Note  Patient: Dennis Macias  Procedure(s) Performed: TRANSURETHRAL RESECTION OF THE PROSTATE (TURP) (N/A )  Patient Location: PACU  Anesthesia Type:General  Level of Consciousness: awake, alert , oriented and patient cooperative  Airway & Oxygen Therapy: Patient Spontanous Breathing and Patient connected to face mask oxygen  Post-op Assessment: Report given to RN and Post -op Vital signs reviewed and stable  Post vital signs: Reviewed and stable  Last Vitals:  Vitals Value Taken Time  BP 129/70 10/20/19 0851  Temp    Pulse 60 10/20/19 0853  Resp 12 10/20/19 0853  SpO2 100 % 10/20/19 0853  Vitals shown include unvalidated device data.  Last Pain:  Vitals:   10/20/19 0415  TempSrc: Oral  PainSc:          Complications: No apparent anesthesia complications

## 2019-10-20 NOTE — Progress Notes (Signed)
Attempted to see pt this am. Pt at procedure. Will attempt back as schedule allows. Jinger Neighbors, [place] [phone]

## 2019-10-20 NOTE — Anesthesia Procedure Notes (Signed)
Procedure Name: LMA Insertion Date/Time: 10/20/2019 7:16 AM Performed by: Garrel Ridgel, CRNA Pre-anesthesia Checklist: Patient identified, Emergency Drugs available, Suction available, Patient being monitored and Timeout performed Patient Re-evaluated:Patient Re-evaluated prior to induction Oxygen Delivery Method: Circle system utilized Preoxygenation: Pre-oxygenation with 100% oxygen Induction Type: IV induction Ventilation: Mask ventilation without difficulty LMA: LMA inserted LMA Size: 4.0 Number of attempts: 1 Placement Confirmation: positive ETCO2 Tube secured with: Tape Dental Injury: Teeth and Oropharynx as per pre-operative assessment

## 2019-10-21 DIAGNOSIS — I5041 Acute combined systolic (congestive) and diastolic (congestive) heart failure: Secondary | ICD-10-CM | POA: Diagnosis not present

## 2019-10-21 DIAGNOSIS — I255 Ischemic cardiomyopathy: Secondary | ICD-10-CM | POA: Diagnosis not present

## 2019-10-21 DIAGNOSIS — N184 Chronic kidney disease, stage 4 (severe): Secondary | ICD-10-CM | POA: Diagnosis not present

## 2019-10-21 DIAGNOSIS — N401 Enlarged prostate with lower urinary tract symptoms: Secondary | ICD-10-CM | POA: Diagnosis not present

## 2019-10-21 DIAGNOSIS — D649 Anemia, unspecified: Secondary | ICD-10-CM

## 2019-10-21 DIAGNOSIS — J948 Other specified pleural conditions: Secondary | ICD-10-CM

## 2019-10-21 DIAGNOSIS — Z9079 Acquired absence of other genital organ(s): Secondary | ICD-10-CM

## 2019-10-21 LAB — BASIC METABOLIC PANEL
Anion gap: 7 (ref 5–15)
BUN: 49 mg/dL — ABNORMAL HIGH (ref 8–23)
CO2: 24 mmol/L (ref 22–32)
Calcium: 8.3 mg/dL — ABNORMAL LOW (ref 8.9–10.3)
Chloride: 108 mmol/L (ref 98–111)
Creatinine, Ser: 3.62 mg/dL — ABNORMAL HIGH (ref 0.61–1.24)
GFR calc Af Amer: 19 mL/min — ABNORMAL LOW (ref >60)
GFR calc non Af Amer: 16 mL/min — ABNORMAL LOW (ref >60)
Glucose, Bld: 111 mg/dL — ABNORMAL HIGH (ref 70–99)
Potassium: 4.3 mmol/L (ref 3.5–5.1)
Sodium: 139 mmol/L (ref 135–145)

## 2019-10-21 LAB — CBC
HCT: 26.9 % — ABNORMAL LOW (ref 39.0–52.0)
Hemoglobin: 8.4 g/dL — ABNORMAL LOW (ref 13.0–17.0)
MCH: 29.6 pg (ref 26.0–34.0)
MCHC: 31.2 g/dL (ref 30.0–36.0)
MCV: 94.7 fL (ref 80.0–100.0)
Platelets: 201 10*3/uL (ref 150–400)
RBC: 2.84 MIL/uL — ABNORMAL LOW (ref 4.22–5.81)
RDW: 13.4 % (ref 11.5–15.5)
WBC: 5.5 10*3/uL (ref 4.0–10.5)
nRBC: 0 % (ref 0.0–0.2)

## 2019-10-21 NOTE — Progress Notes (Signed)
Dr. Kerrie Pleasure on unit to see patient this afternoon.  Patient bladder scanned.  Dr. Graylon Good gave order to bladder scan patient at 1800 and then every 6 hours or more often, if needed.  May insert coude catheter, if bladder scan >400 ml of urine.

## 2019-10-21 NOTE — Progress Notes (Signed)
Pt catheter removed at 1015. No complaints from patient at this time. Pt due to void by 1615. Will monitor pt.

## 2019-10-21 NOTE — Progress Notes (Signed)
Physical Therapy Treatment Patient Details Name: Dennis Macias MRN: [id number] DOB: 07/29/1950 Today's Date: 10/21/2019    History of Present Illness Pt admitted with SOB and recurrecnt pleural effusion. Pt d/c'd on 09/21/19 with B pleural effusions.  Has done well at home until last few days.  Pt iwth PMH of glaucoma, DM, blind in R eye, cataract, CKD, L renal mass, BPH with unrinary obsturction scheduled for TURP on 10/20/19 as outpatient.      PT Comments    Pt up in chair, but agreeable to therapy. Pt able to ambulate longer distance without SOB or pain. Pt requires min guard with ambulation due to RW getting caught up in 2 objects while in hallway, able to reposition and continue without physical assistance but verbal cues to clear objects at safe distance. Pt requests to return to bed at EOS, mod independent with mobility. Patient will benefit from continued physical therapy in hospital and recommendations below to increase strength, balance, endurance for safe ADLs and gait.   Follow Up Recommendations  Home health PT;Other (comment)(Has been receiving HHPT PTA)     Equipment Recommendations  None recommended by PT;Other (comment)(pt has equipment)    Recommendations for Other Services       Precautions / Restrictions Precautions Precautions: Fall Precaution Comments: HOH, R eye decreased vision Restrictions Weight Bearing Restrictions: No    Mobility  Bed Mobility Overal bed mobility: Modified Independent             General bed mobility comments: up in chair upon arrival; sit to supine at EOS mod Ind  Transfers Overall transfer level: Needs assistance Equipment used: Rolling walker (2 wheeled) Transfers: Sit to/from Stand Sit to Stand: Supervision         General transfer comment: rocking momentum to rise from chair  Ambulation/Gait Ambulation/Gait assistance: Min guard Gait Distance (Feet): 120 Feet Assistive device: Rolling walker (2 wheeled) Gait  Pattern/deviations: Step-through pattern;Decreased stride length;Trunk flexed Gait velocity: decreased   General Gait Details: decreased cadence with stiff bil knee throughout gait cycle, denies pain or SOB with mobility, RW stopped twice in hallway due bumping into objects but able to reposition RW and continue ambulation without loss of balance   Stairs             Wheelchair Mobility    Modified Rankin (Stroke Patients Only)       Balance Overall balance assessment: Needs assistance Sitting-balance support: Feet supported Sitting balance-Leahy Scale: Good     Standing balance support: Bilateral upper extremity supported;During functional activity Standing balance-Leahy Scale: Poor Standing balance comment: dependent on RW for steadying                   Cognition Arousal/Alertness: Awake/alert Behavior During Therapy: WFL for tasks assessed/performed Overall Cognitive Status: Within Functional Limits for tasks assessed                 Exercises      General Comments        Pertinent Vitals/Pain Pain Assessment: No/denies pain    Home Living                      Prior Function            PT Goals (current goals can now be found in the care plan section) Acute Rehab PT Goals Patient Stated Goal: to move better PT Goal Formulation: With patient Time For Goal Achievement: 10/30/19 Potential to Achieve Goals: Good Progress  towards PT goals: Progressing toward goals    Frequency    Min 3X/week      PT Plan Current plan remains appropriate    Co-evaluation              AM-PAC PT "6 Clicks" Mobility   Outcome Measure  Help needed turning from your back to your side while in a flat bed without using bedrails?: None Help needed moving from lying on your back to sitting on the side of a flat bed without using bedrails?: None Help needed moving to and from a bed to a chair (including a wheelchair)?: A Little Help needed  standing up from a chair using your arms (e.g., wheelchair or bedside chair)?: None Help needed to walk in hospital room?: A Little Help needed climbing 3-5 steps with a railing? : A Lot 6 Click Score: 20    End of Session Equipment Utilized During Treatment: Gait belt Activity Tolerance: Patient tolerated treatment well Patient left: in bed;with call bell/phone within reach;with bed alarm set Nurse Communication: Mobility status PT Visit Diagnosis: Unsteadiness on feet (R26.81);Other abnormalities of gait and mobility (R26.89)     Time: 6381-7711 PT Time Calculation (min) (ACUTE ONLY): 13 min  Charges:  $Gait Training: 8-22 mins                      Tori Darien Kading PT, DPT 10/21/19, 12:16 PM [phone]

## 2019-10-21 NOTE — Progress Notes (Signed)
Bladder scan performed at 1845 and resulted in 431 ml of urine. 20 Fr coude catheter placed with return of pale pink urine. Stacey Drain

## 2019-10-21 NOTE — TOC Benefit Eligibility Note (Signed)
Transition of Care [facility]) Benefit Eligibility Note    Patient Details  Name: Hisao Doo MRN: [id number] Date of Birth: 1951/01/30   Medication/Dose: 20-37.7 mg po TID for 30 day supply  Covered?: Yes  Tier: Other(tier 4 drug)  Prescription Coverage Preferred Pharmacy: [facility] with Person/Company/Phone Number:: Donald/ [employer] DST [phone]  Co-Pay: Bidil is $121.26 and Isosorbide Hydralazine not found but found Isosorbide Dinitrate and it is $16.50  Prior Approval: No  Deductible: Met       Kerin Salen Phone Number: 10/21/2019, 1:18 PM

## 2019-10-21 NOTE — Progress Notes (Signed)
Occupational Therapy Treatment Patient Details Name: Dennis Macias MRN: [id number] DOB: 01/10/1951 Today's Date: 10/21/2019    History of present illness Pt admitted with SOB and recurrecnt pleural effusion. Pt d/c'd on 09/21/19 with B pleural effusions.  Has done well at home until last few days.  Pt iwth PMH of glaucoma, DM, blind in R eye, cataract, CKD, L renal mass, BPH with unrinary obsturction scheduled for TURP on 10/20/19 as outpatient.     OT comments  Patient s/p TURP, denies any pain this morning. Progressing towards acute OT goals with increased activity tolerance this session, able to complete functional transfers and ambulation at supervision/min guard level for safety and managing IV pole.    Follow Up Recommendations  Supervision/Assistance - 24 hour;No OT follow up    Equipment Recommendations  None recommended by OT       Precautions / Restrictions Precautions Precautions: Fall Precaution Comments: HOH Restrictions Weight Bearing Restrictions: No       Mobility Bed Mobility Overal bed mobility: Modified Independent             General bed mobility comments: No physical assistance needed. HOB elevated   Transfers Overall transfer level: Needs assistance Equipment used: Rolling walker (2 wheeled) Transfers: Sit to/from Stand Sit to Stand: Supervision         General transfer comment: supervision for safety    Balance Overall balance assessment: Needs assistance Sitting-balance support: Feet supported Sitting balance-Leahy Scale: Good     Standing balance support: Bilateral upper extremity supported;During functional activity Standing balance-Leahy Scale: Poor Standing balance comment: increased stability with B UE support                           ADL either performed or assessed with clinical judgement   ADL Overall ADL's : Needs assistance/impaired                     Lower Body Dressing:  Supervision/safety;Sitting/lateral leans Lower Body Dressing Details (indicate cue type and reason): to pull up socks seated edge of bed Toilet Transfer: Min guard;Ambulation;RW Toilet Transfer Details (indicate cue type and reason): simulated with functional mobility, min guard for safety and management of IV pole         Functional mobility during ADLs: Min guard;Rolling walker General ADL Comments: patient tolerate functional ambulation using rolling walker, denies any shortness of breath, dizziness demonstrating increased activity tolerance from previous session.                Cognition Arousal/Alertness: Awake/alert Behavior During Therapy: WFL for tasks assessed/performed Overall Cognitive Status: Within Functional Limits for tasks assessed                                                     Pertinent Vitals/ Pain       Pain Assessment: No/denies pain         Frequency  Min 2X/week        Progress Toward Goals  OT Goals(current goals can now be found in the care plan section)  Progress towards OT goals: Progressing toward goals  Acute Rehab OT Goals Patient Stated Goal: to move better OT Goal Formulation: With patient Time For Goal Achievement: 10/30/19 Potential to Achieve Goals: Good ADL Goals Pt Will Perform Grooming: with  modified independence;standing Pt Will Perform Lower Body Dressing: with modified independence;sit to/from stand Additional ADL Goal #1: Pt will walk to bathroom with walker and complete all toileting tasks on 3:1 over commode with mod I. Additional ADL Goal #2: Pt will state 3 things that can be done at home to increase efficiency with adls with no VCs.  Plan Discharge plan remains appropriate       AM-PAC OT "6 Clicks" Daily Activity     Outcome Measure   Help from another person eating meals?: None Help from another person taking care of personal grooming?: A Little Help from another person toileting,  which includes using toliet, bedpan, or urinal?: A Little Help from another person bathing (including washing, rinsing, drying)?: A Little Help from another person to put on and taking off regular upper body clothing?: None Help from another person to put on and taking off regular lower body clothing?: A Little 6 Click Score: 20    End of Session Equipment Utilized During Treatment: Rolling walker  OT Visit Diagnosis: Unsteadiness on feet (R26.81)   Activity Tolerance Patient tolerated treatment well   Patient Left in chair;with call bell/phone within reach;with chair alarm set           Time: 6283-6629 OT Time Calculation (min): 27 min  Charges: OT General Charges $OT Visit: 1 Visit OT Treatments $Self Care/Home Management : 23-37 mins  Shon Millet OT Pager: [place] 10/21/2019, 9:23 AM

## 2019-10-21 NOTE — Progress Notes (Signed)
PROGRESS NOTE    Dennis Macias  FIE:[id number] DOB: January 23, 1951 DOA: 10/14/2019 PCP: [name], MD   Chief Complaint  Patient presents with  . Shortness of Breath    Brief Narrative:  69 year old with past medical history significant for CKD stage IV with nephrotic syndrome, hypertension, diabetes type 2, BPH with obstruction and indwelling Foley catheter, hematuria and anemia who presents with shortness of breath in the setting of bilateral pleural effusion. Patient was admitted by the teaching service at [facility], for worsening shortness of breath and respiratory failure.  He underwent thoracentesis on 10/16/2019 left side thoracentesis yielding 1.3 L of clear fluid.  5 L from the right on 4/10.  Patient was treated with IV Lasix.. Cardiology was consulted and cleared patient for TURP procedure.  They recommended to stop Norvasc due to-year-old lower ejection fraction.  Patient was a started on BiDil. Nephrology was consulted and has signed off. Patient underwent TURP procedure on 4/13.    Assessment & Plan:   Principal Problem:   Acute combined systolic and diastolic congestive heart failure (HCC) Active Problems:   Normocytic anemia   BPH with urinary obstruction   CKD (chronic kidney disease) stage 4, GFR 15-29 ml/min (HCC)   Nephrotic syndrome   Pleural effusion   Dyspnea   Cardiorenal syndrome   Ischemic cardiomyopathy: Probable   Pleural effusion transudative   S/P TURP  1 acute systolic and diastolic CHF exacerbation/bilateral pleural effusions Likely secondary to stage IV chronic disease and nephrotic syndrome ischemic cardiomyopathy..  Patient initially on IV Lasix which were held in anticipation of surgery.  Patient with urine output of 5.575 L over the past 24 hours.  Patient is -3.878 L during this hospitalization.  Weight of 159.61 pounds from 177.91 pounds on admission.  Status post bilateral therapeutic thoracentesis.  Patient with clinical  improvement.  Patient currently on oral Lasix 80 mg daily as recommended per cardiology.  Continue statin, BiDil, Toprol-XL.  2.  Ischemic cardiomyopathy with abnormal stress test Patient with 2D echo with EF of 40 to 45% with wall motion abnormalities.  Patient underwent a Lexiscan Myoview which was abnormal.  Per cardiology risk of cath outweighs benefits as patient is high risk for contrast nephropathy and patient underwent recent TURP.  Cardiology recommending medical management.  Cardiology recommending avoidance of ACE inhibitor, ARB, ARN I, MRA given his chronic kidney disease.  Patient started on BiDil which per cardiology can be split into isosorbide hydralazine separately prior to discharge due to cost.  Patient also started on metoprolol succinate 50 mg daily.  Cardiology recommending resumption of aspirin 81 mg daily once cleared from a urological standpoint.  Continue statin.  Lasix 80 mg resumed.  Norvasc discontinued secondary to LV dysfunction.  Will need outpatient follow-up with cardiology.  3.  Chronic kidney disease stage IV with nephrotic syndrome Status post IV Lasix.  Patient currently on Lasix 80 mg daily.  Creatinine currently stable ranging from 3.2-3.6.  Will need outpatient follow-up with nephrology.  4.  Normocytic anemia/anemia of chronic disease/acute blood loss anemia H&H stable.  Outpatient follow-up.  5.  BPH with chronic indwelling Foley catheter s/p TURP (10/20/2019) Status post TURP 10/20/2019 per urology, Dr.Wrenn.  Continuous bladder irrigation clamped and urine clear.  Urology.  Patient ambulating.  Foley catheter discontinued per urology and patient for voiding trial later on today.  Per urology.    DVT prophylaxis: SCDs Code Status: Full Family Communication: Updated patient.  No family at bedside. Disposition:  Status is: Inpatient    Dispo: The patient is from: Home              Anticipated d/c is to: Home              Anticipated d/c date is:  10/22/2019              Patient currently: Improving clinically.  Status post TURP.  Foley catheter to be removed today and voiding trial.  If continued improvement and passes voiding trial could likely be discharged tomorrow 10/22/2019.        Consultants:   Nephrology: Dr.Schertz 10/17/2019  Cardiology: Dr. Johnsie Cancel 10/16/2019  Procedures:   Chest x-ray 10/14/2019, 10/16/2019, 10/18/2019  Lexiscan Myoview  10/17/2019  2D echo 10/15/2019  Ultrasound-guided therapeutic left and right thoracentesis 10/16/2019 per Dr. Annamaria Boots IR  Transurethral resection of prostate per Dr. Jeffie Pollock 10/20/2019  Antimicrobials:   IV Rocephin 10/19/2019>>>>>   Subjective: Patient laying in bed.  States he is feeling better.  Denies any significant shortness of breath.  Denies any chest pain.  Good urine output.  Objective: Vitals:   10/20/19 2101 10/21/19 0707 10/21/19 0710 10/21/19 1225  BP: (!) 152/75 133/70  (!) 154/74  Pulse: 63 60  67  Resp: 19 20  18   Temp: 98 F (36.7 C) 99.8 F (37.7 C)  98 F (36.7 C)  TempSrc: Oral Oral  Oral  SpO2: 100% 97%  99%  Weight:   72.4 kg   Height:        Intake/Output Summary (Last 24 hours) at 10/21/2019 1638 Last data filed at 10/21/2019 1554 Gross per 24 hour  Intake 4140 ml  Output 4335 ml  Net -195 ml   Filed Weights   10/19/19 0531 10/20/19 0650 10/21/19 0710  Weight: 71.3 kg 71.3 kg 72.4 kg    Examination:  General exam: Appears calm and comfortable  Respiratory system: Decreased bibasilar crackles.  No wheezing.  Normal respiratory effort.  Speaking in full sentences.   Cardiovascular system: S1 & S2 heard, RRR. No JVD, murmurs, rubs, gallops or clicks. No pedal edema. Gastrointestinal system: Abdomen is nondistended, soft and nontender. No organomegaly or masses felt. Normal bowel sounds heard. Central nervous system: Alert and oriented. No focal neurological deficits. Extremities: Symmetric 5 x 5 power. Skin: No rashes, lesions or  ulcers Psychiatry: Judgement and insight appear normal. Mood & affect appropriate.     Data Reviewed: I have personally reviewed following labs and imaging studies  CBC: Recent Labs  Lab 10/17/19 0603 10/18/19 0454 10/19/19 0553 10/20/19 0503 10/21/19 0454  WBC 4.8 5.1 4.6 4.6 5.5  HGB 8.6* 8.6* 8.3* 8.6* 8.4*  HCT 26.2* 26.5* 26.2* 26.8* 26.9*  MCV 91.3 91.4 93.9 94.0 94.7  PLT 207 198 203 201 545    Basic Metabolic Panel: Recent Labs  Lab 10/15/19 0851 10/16/19 0107 10/18/19 0454 10/18/19 1642 10/19/19 0553 10/20/19 0503 10/21/19 0454  NA  --    < > 142 140 140 139 139  K  --    < > 4.2 3.8 4.0 4.0 4.3  CL  --    < > 108 109 107 107 108  CO2  --    < > 24 24 23 24 24   GLUCOSE  --    < > 114* 195* 112* 112* 111*  BUN  --    < > 45* 42* 43* 47* 49*  CREATININE  --    < > 3.52* 3.49* 3.52* 3.45* 3.62*  CALCIUM  --    < > 8.5* 8.4* 8.4* 8.5* 8.3*  MG 1.9  --   --   --   --   --   --    < > = values in this interval not displayed.    GFR: Estimated Creatinine Clearance: 20 mL/min (A) (by C-G formula based on SCr of 3.62 mg/dL (H)).  Liver Function Tests: Recent Labs  Lab 10/17/19 1647 10/18/19 0454 10/18/19 1642 10/19/19 0553 10/20/19 0503  AST 13* 13* 13* 12* 12*  ALT 11 10 11 13 12   ALKPHOS 46 51 48 43 42  BILITOT 0.2* 0.5 0.6 0.3 0.4  PROT 5.3* 5.0* 5.1* 5.1* 5.1*  ALBUMIN 2.4* 2.4* 2.5* 2.4* 2.7*    CBG: Recent Labs  Lab 10/20/19 0902  GLUCAP 95     Recent Results (from the past 240 hour(s))  SARS CORONAVIRUS 2 (TAT 6-24 HRS) Nasopharyngeal Nasopharyngeal Swab     Status: None   Collection Time: 10/15/19  6:35 AM   Specimen: Nasopharyngeal Swab  Result Value Ref Range Status   SARS Coronavirus 2 NEGATIVE NEGATIVE Final    Comment: (NOTE) SARS-CoV-2 target nucleic acids are NOT DETECTED. The SARS-CoV-2 RNA is generally detectable in upper and lower respiratory specimens during the acute phase of infection. Negative results do not  preclude SARS-CoV-2 infection, do not rule out co-infections with other pathogens, and should not be used as the sole basis for treatment or other patient management decisions. Negative results must be combined with clinical observations, patient history, and epidemiological information. The expected result is Negative. Fact Sheet for Patients: SugarRoll.be Fact Sheet for Healthcare Providers: https://www.woods-mathews.com/ This test is not yet approved or cleared by the [place] FDA and  has been authorized for detection and/or diagnosis of SARS-CoV-2 by FDA under an Emergency Use Authorization (EUA). This EUA will remain  in effect (meaning this test can be used) for the duration of the COVID-19 declaration under Section 56 4(b)(1) of the Act, 21 U.S.C. section 360bbb-3(b)(1), unless the authorization is terminated or revoked sooner. Performed at [facility], [place] [address], [place], [place] [address]   Surgical pcr screen     Status: None   Collection Time: 10/19/19  6:55 PM   Specimen: Nasal Mucosa; Nasal Swab  Result Value Ref Range Status   MRSA, PCR NEGATIVE NEGATIVE Final   Staphylococcus aureus NEGATIVE NEGATIVE Final    Comment: (NOTE) The Xpert SA Assay (FDA approved for NASAL specimens in patients 46 years of age and older), is one component of a comprehensive surveillance program. It is not intended to diagnose infection nor to guide or monitor treatment. Performed at [facility], [place] [address] [place], [place] [address]          Radiology Studies: No results found.      Scheduled Meds: . atorvastatin  80 mg Oral QHS  . Chlorhexidine Gluconate Cloth  6 each Topical Daily  . dorzolamide-timolol  1 drop Right Eye BID  . ferrous gluconate  324 mg Oral Q breakfast  . furosemide  80 mg Oral Daily  . isosorbide-hydrALAZINE  1 tablet Oral TID  . metoprolol succinate  50 mg Oral Daily   . sodium chloride flush  3 mL Intravenous Once  . vitamin B-12  1,000 mcg Oral Daily   Continuous Infusions: . cefTRIAXone (ROCEPHIN)  IV 1 g (10/21/19 1022)     LOS: 6 days    Time spent: 40 minutes  Irine Seal, MD [facility]   To contact the attending provider between 7A-7P or the covering provider during after hours 7P-7A, please log into the web site [email or web address] and access using universal [place] password for that web site. If you do not have the password, please call the hospital operator.  10/21/2019, 4:38 PM

## 2019-10-21 NOTE — Progress Notes (Signed)
1 Day Post-Op Subjective: Patient doing well from postoperative standpoint.  Minimal pain.  Clamped CBI, urine is clear.  Ambulating this morning.  Tolerating p.o.  Objective: Vital signs in last 24 hours: Temp:  [97.8 F (36.6 C)-99.8 F (37.7 C)] 99.8 F (37.7 C) (04/14 0707) Pulse Rate:  [53-63] 60 (04/14 0707) Resp:  [11-20] 20 (04/14 0707) BP: (129-152)/(69-75) 133/70 (04/14 0707) SpO2:  [97 %-100 %] 97 % (04/14 0707) Weight:  [72.4 kg] 72.4 kg (04/14 0710)  Intake/Output from previous day: 04/13 0701 - 04/14 0700 In: 6570 [P.O.:720; I.V.:250; IV Piggyback:100] Out: 5580 [Urine:5575; Blood:5] Intake/Output this shift: No intake/output data recorded.  Physical Exam:  General: Alert and oriented Pulmonary: bormal work of breathing Abdomen: Soft, non distended GU: catheter to drainage, clear urine in tubing  Lab Results: Recent Labs    10/19/19 0553 10/20/19 0503 10/21/19 0454  HGB 8.3* 8.6* 8.4*  HCT 26.2* 26.8* 26.9*   BMET Recent Labs    10/20/19 0503 10/21/19 0454  NA 139 139  K 4.0 4.3  CL 107 108  CO2 24 24  GLUCOSE 112* 111*  BUN 47* 49*  CREATININE 3.45* 3.62*  CALCIUM 8.5* 8.3*     Studies/Results: No results found.  Assessment/Plan: 69 year old gentleman status post transurethral resection of prostate yesterday.  Urine is appropriately clear this morning.  Continuous bladder irrigation was clamped, and patient is ambulating this morning.   -If urine remains clear after ambulating, RN to remove Foley and perform trial of void.  Discussed with nursing staff this morning. -If patient passes trial of void, then he is clear to discharge from a urology standpoint.  We will arrange for outpatient follow-up.   LOS: 6 days   Haskel Schroeder 10/21/2019, 8:12 AM

## 2019-10-21 NOTE — TOC Progression Note (Signed)
Transition of Care [facility]) - Progression Note    Patient Details  Name: Dennis Macias MRN: [id number] Date of Birth: 09-15-1950  Transition of Care [facility]) CM/SW Contact  Courvoisier Hamblen, Juliann Pulse, RN Phone Number: 10/21/2019, 11:24 AM  Clinical Narrative:  Benefit check in process for [facility]-await response.     Expected Discharge Plan: [place] Barriers to Discharge: Continued Medical Work up  Expected Discharge Plan and Services Expected Discharge Plan: [place]   Discharge Planning Services: CM Consult Post Acute Care Choice: Durable Medical Equipment, Home Health Living arrangements for the past 2 months: Single Family Home                 DME Arranged: N/A                     Social Determinants of Health (SDOH) Interventions    Readmission Risk Interventions Readmission Risk Prevention Plan 08/10/2019  Transportation Screening Complete  PCP or Specialist Appt within 3-5 Days Complete  HRI or [place] Complete  Social Work Consult for [place] Planning/Counseling Complete  Palliative Care Screening Complete  Medication Review [employer]) Complete  Some recent data might be hidden

## 2019-10-22 DIAGNOSIS — I5041 Acute combined systolic (congestive) and diastolic (congestive) heart failure: Secondary | ICD-10-CM | POA: Diagnosis not present

## 2019-10-22 DIAGNOSIS — I255 Ischemic cardiomyopathy: Secondary | ICD-10-CM | POA: Diagnosis not present

## 2019-10-22 DIAGNOSIS — N401 Enlarged prostate with lower urinary tract symptoms: Secondary | ICD-10-CM | POA: Diagnosis not present

## 2019-10-22 DIAGNOSIS — N184 Chronic kidney disease, stage 4 (severe): Secondary | ICD-10-CM | POA: Diagnosis not present

## 2019-10-22 LAB — RENAL FUNCTION PANEL
Albumin: 2.4 g/dL — ABNORMAL LOW (ref 3.5–5.0)
Anion gap: 7 (ref 5–15)
BUN: 50 mg/dL — ABNORMAL HIGH (ref 8–23)
CO2: 24 mmol/L (ref 22–32)
Calcium: 8.3 mg/dL — ABNORMAL LOW (ref 8.9–10.3)
Chloride: 108 mmol/L (ref 98–111)
Creatinine, Ser: 3.55 mg/dL — ABNORMAL HIGH (ref 0.61–1.24)
GFR calc Af Amer: 19 mL/min — ABNORMAL LOW (ref >60)
GFR calc non Af Amer: 17 mL/min — ABNORMAL LOW (ref >60)
Glucose, Bld: 123 mg/dL — ABNORMAL HIGH (ref 70–99)
Phosphorus: 5.1 mg/dL — ABNORMAL HIGH (ref 2.5–4.6)
Potassium: 4.5 mmol/L (ref 3.5–5.1)
Sodium: 139 mmol/L (ref 135–145)

## 2019-10-22 LAB — CBC
HCT: 26.2 % — ABNORMAL LOW (ref 39.0–52.0)
Hemoglobin: 8.4 g/dL — ABNORMAL LOW (ref 13.0–17.0)
MCH: 30.3 pg (ref 26.0–34.0)
MCHC: 32.1 g/dL (ref 30.0–36.0)
MCV: 94.6 fL (ref 80.0–100.0)
Platelets: 216 10*3/uL (ref 150–400)
RBC: 2.77 MIL/uL — ABNORMAL LOW (ref 4.22–5.81)
RDW: 13.3 % (ref 11.5–15.5)
WBC: 5.3 10*3/uL (ref 4.0–10.5)
nRBC: 0.4 % — ABNORMAL HIGH (ref 0.0–0.2)

## 2019-10-22 MED ORDER — METOPROLOL SUCCINATE ER 50 MG PO TB24: 50.0000 mg | ORAL_TABLET | Freq: Every day | ORAL | 1 refills | Status: DC

## 2019-10-22 MED ORDER — HYDRALAZINE HCL 25 MG PO TABS: 37.5000 mg | ORAL_TABLET | Freq: Three times a day (TID) | ORAL | 1 refills | Status: DC

## 2019-10-22 MED ORDER — FUROSEMIDE 80 MG PO TABS: 80.0000 mg | ORAL_TABLET | Freq: Every day | ORAL | 1 refills | Status: DC

## 2019-10-22 MED ORDER — ASPIRIN EC 81 MG PO TBEC: 81.0000 mg | DELAYED_RELEASE_TABLET | Freq: Every day | ORAL | Status: AC

## 2019-10-22 MED ORDER — ISOSORBIDE DINITRATE 20 MG PO TABS: 20.0000 mg | ORAL_TABLET | Freq: Three times a day (TID) | ORAL | 1 refills | Status: DC

## 2019-10-22 MED FILL — hydrALAZINE HCL 25 MG TABS: 25 | 30 days supply | Qty: 135 | Fill #0

## 2019-10-22 MED FILL — ISOSORBIDE DN 20 MG TABLET: 20 | 30 days supply | Qty: 90 | Fill #0

## 2019-10-22 MED FILL — METOPROLOL SUCCINATE ER 50: 50 | 30 days supply | Qty: 30 | Fill #0

## 2019-10-22 NOTE — Progress Notes (Signed)
2 Days Post-Op Subjective: Patient doing well from postoperative standpoint.  Minimal pain.   Failed trial of void, catheter replaced successfully, urine output adequate, urine clear.   Objective: Vital signs in last 24 hours: Temp:  [98 F (36.7 C)-99 F (37.2 C)] 99 F (37.2 C) (04/15 0609) Pulse Rate:  [65-67] 65 (04/15 0609) Resp:  [16-20] 16 (04/15 0609) BP: (146-165)/(74-78) 146/78 (04/15 0609) SpO2:  [99 %-100 %] 99 % (04/15 0609) Weight:  [72.6 kg] 72.6 kg (04/15 0600)  Intake/Output from previous day: 04/14 0701 - 04/15 0700 In: 240 [P.O.:240] Out: 1160 [Urine:1160] Intake/Output this shift: No intake/output data recorded.  Physical Exam:  General: Alert and oriented Pulmonary: normal work of breathing Abdomen: Soft, non distended GU: catheter to drainage, clear urine in tubing  Lab Results: Recent Labs    10/20/19 0503 10/21/19 0454 10/22/19 0505  HGB 8.6* 8.4* 8.4*  HCT 26.8* 26.9* 26.2*   BMET Recent Labs    10/21/19 0454 10/22/19 0505  NA 139 139  K 4.3 4.5  CL 108 108  CO2 24 24  GLUCOSE 111* 123*  BUN 49* 50*  CREATININE 3.62* 3.55*  CALCIUM 8.3* 8.3*     Studies/Results: No results found.  Assessment/Plan: 69 year old gentleman status post transurethral resection of prostate. Failed trial of void on POD1, catheter replaced and will need to discharge with catheter.   - Okay to discharge from a urology standpoint. Discharge with catheter to drainage. - We will arrange follow up at [facility] for trial of void in one week.    LOS: 7 days   Haskel Schroeder 10/22/2019, 7:25 AM

## 2019-10-22 NOTE — Progress Notes (Signed)
Physical Therapy Treatment Patient Details Name: Dennis Macias MRN: [id number] DOB: 07-Dec-1950 Today's Date: 10/22/2019    History of Present Illness Pt admitted with SOB and recurrecnt pleural effusion. Pt d/c'd on 09/21/19 with B pleural effusions.  Has done well at home until last few days.  Pt iwth PMH of glaucoma, DM, blind in R eye, cataract, CKD, L renal mass, BPH with unrinary obsturction scheduled for TURP on 10/20/19 as outpatient.      PT Comments    The patient is progressing well. Plans Dc today.  Follow Up Recommendations  Home health PT     Equipment Recommendations  None recommended by PT;Other (comment)    Recommendations for Other Services       Precautions / Restrictions Precautions Precautions: Fall Precaution Comments: HOH, R eye decreased vision    Mobility  Bed Mobility Overal bed mobility: Modified Independent                Transfers Overall transfer level: Needs assistance Equipment used: Rolling walker (2 wheeled) Transfers: Sit to/from Stand Sit to Stand: Supervision         General transfer comment: no assistance  Ambulation/Gait Ambulation/Gait assistance: Min guard Gait Distance (Feet): 210 Feet Assistive device: Rolling walker (2 wheeled) Gait Pattern/deviations: Step-through pattern;Decreased stride length;Trunk flexed         Stairs             Wheelchair Mobility    Modified Rankin (Stroke Patients Only)       Balance     Sitting balance-Leahy Scale: Good     Standing balance support: Bilateral upper extremity supported;During functional activity Standing balance-Leahy Scale: Fair Standing balance comment: dependent on RW for steadying                            Cognition Arousal/Alertness: Awake/alert Behavior During Therapy: WFL for tasks assessed/performed                                          Exercises      General Comments        Pertinent Vitals/Pain  Pain Assessment: No/denies pain    Home Living                      Prior Function            PT Goals (current goals can now be found in the care plan section) Progress towards PT goals: Progressing toward goals    Frequency    Min 3X/week      PT Plan Current plan remains appropriate    Co-evaluation              AM-PAC PT "6 Clicks" Mobility   Outcome Measure  Help needed turning from your back to your side while in a flat bed without using bedrails?: None Help needed moving from lying on your back to sitting on the side of a flat bed without using bedrails?: None Help needed moving to and from a bed to a chair (including a wheelchair)?: A Little Help needed standing up from a chair using your arms (e.g., wheelchair or bedside chair)?: A Little Help needed to walk in hospital room?: A Little Help needed climbing 3-5 steps with a railing? : A Lot 6 Click Score: 19    End  of Session Equipment Utilized During Treatment: Gait belt Activity Tolerance: Patient tolerated treatment well Patient left: in chair;with call bell/phone within reach Nurse Communication: Mobility status PT Visit Diagnosis: Unsteadiness on feet (R26.81);Other abnormalities of gait and mobility (R26.89)     Time: 1829-9371 PT Time Calculation (min) (ACUTE ONLY): 17 min  Charges:  $Gait Training: 8-22 mins                     [place] Pager [phone] Office [phone]    Claretha Cooper 10/22/2019, 3:57 PM

## 2019-10-22 NOTE — Progress Notes (Signed)
Went over discharge papers with patient and family.  Foley and leg bag teaching completed with teach back.  All questions answered.  AVS and prescriptions given.  PT wheeled out by NT.

## 2019-10-22 NOTE — Discharge Summary (Signed)
Physician Discharge Summary  Dennis Macias ZOX:[id number] DOB: [birth year] DOA: 10/14/2019  PCP: [name], MD  Admit date: 10/14/2019 Discharge date: 10/22/2019  Time spent: 60 minutes  Recommendations for Outpatient Follow-up:  1. Follow-up with Dr. Irish Lack, cardiology in 2 to 3 weeks.  On follow-up patient CHF/ischemic cardiomyopathy needs to be reassessed.  Patient will need a basic metabolic profile done to follow-up on electrolytes and renal function. 2. Follow-up with [name], MD in 2 weeks.  On follow-up patient will need a CBC and a basic metabolic profile done to follow-up on electrolytes and renal function and H&H. 3. Follow-up with Dr. Jeffie Pollock, urology in 1 week for voiding trial and further evaluation management of BPH.  On follow-up patient will need a basic metabolic profile done to follow-up on electrolytes and renal function. 4. Follow-up with Dr. [facility] in 2 weeks.   Discharge Diagnoses:  Principal Problem:   Acute combined systolic and diastolic congestive heart failure (HCC) Active Problems:   Normocytic anemia   BPH with urinary obstruction   CKD (chronic kidney disease) stage 4, GFR 15-29 ml/min (HCC)   Nephrotic syndrome   Pleural effusion   Dyspnea   Cardiorenal syndrome   Ischemic cardiomyopathy: Probable   Pleural effusion transudative   S/P TURP   Discharge Condition: Stable and improved  Diet recommendation: Heart healthy  Filed Weights   10/20/19 0650 10/21/19 0710 10/22/19 0600  Weight: 71.3 kg 72.4 kg 72.6 kg    History of present illness:  HPI per Dr. Domingo Pulse Dennis Macias is a 69 yo M with a PMHx notable for CKD-IV with nephrotic syndrome, HTN, T2DM, BPH w/ obstruction and indwelling foley catheter, justatory hyperhidrosis, unintentional weight loss, hematuria, and anemia who presented with a one day history of shortness of breath.  Per the patient he has been doing well since his discharge from  the hospital last month and had been following up with his outpatient appointments as scheduled.  Shortness of breath may have progressively worsened over the preceding 2 to 3 days but became most concerning today prior to his presentation to the ER.  He endorses only associated cough. He denied fever, chills, nausea, vomiting, headache, visual changes, abdominal pain, joint pain, rash, dysuria, hematuria, loose stool, constipation, myalgias, or other concerning symptoms.  He had a recent admission with discharge on 09/21/19 where he was initially admitted for excessive diaphoresis but found to have bilateral pleural effusions and worsening of his nephrotic syndrome with diffuse anasarca.  The patient's anasarca and bilateral pleural effusions prompted a therapeutic thoracentesis which revealed a transudative process consistent with his hypoalbuminemia with nephrotic syndrome.  He was to follow-up with nephrology for this.  Additionally, the patient was noted to have urinary retention and was felt that the acute worsening of his volume retention was due to a urinary obstruction from BPH.  He is currently scheduled for a TURP with Dr. Jeffie Pollock at [facility] on 10/20/2019.  He was to continue on finasteride and tamsulosin as well as his indwelling urinary catheter until that time.  Hospital Course:  1 acute systolic and diastolic CHF exacerbation/bilateral pleural effusions Likely secondary to stage IV chronic disease and nephrotic syndrome ischemic cardiomyopathy..  Patient initially was placed on IV Lasix in anticipation of surgery with good diuresis.  Patient was -4.318 L during this hospitalization.  Patient was subsequently transition to oral Lasix 80 mg daily as recommended per cardiology.  Patient underwent bilateral therapeutic thoracentesis during the hospitalization.  Patient  improved clinically.  Patient subsequently maintained on statin, BiDil, Toprol-XL per cardiology recommendations.   Outpatient follow-up with cardiology and PCP.  2.  Ischemic cardiomyopathy with abnormal stress test Patient with 2D echo with EF of 40 to 45% with wall motion abnormalities.  Patient underwent a Lexiscan Myoview which was abnormal.  Per cardiology risk of cath outweighs benefits as patient is high risk for contrast nephropathy and patient underwent recent TURP.  Cardiology recommended medical management.  Cardiology recommended avoidance of ACE inhibitor, ARB, ARN I, MRA given his chronic kidney disease.  Patient started on BiDil which per cardiology can be split into isosorbide and hydralazine separately prior to discharge due to cost.  Patient also started on metoprolol succinate 50 mg daily.  Cardiology recommended resumption of aspirin 81 mg daily once cleared from a urological standpoint.  Aspirin will be started on discharge.  Patient also maintained on a statin.  Patient's Norvasc was discontinued secondary to LV dysfunction.  Outpatient follow-up with cardiology.   3.  Chronic kidney disease stage IV with nephrotic syndrome Patient initially placed on Lasix 40 mg IV every 12 hours.  Nephrology followed the patient during the hospitalization.  Patient improved clinically.  Patient diuresed well and was -4.318 L during this hospitalization.  Patient's creatinine remained stable and was 3.55 by day of discharge.  Outpatient follow-up with nephrology and urology.    4.  Normocytic anemia/anemia of chronic disease/acute blood loss anemia H&H remained stable.  Outpatient follow-up.    5.  BPH with chronic indwelling Foley catheter s/p TURP (10/20/2019) Status post TURP 10/20/2019 per urology, Dr.Wrenn.  Continuous bladder irrigation clamped and urine clear.  Foley catheter was subsequently discontinued however patient failed voiding trial and Foley catheter placed back in.  Outpatient follow-up with urology in 1 week for voiding trial and further evaluation.  Patient's Proscar and Flomax were  discontinued on discharge and discussion with urology.   Procedures:  Chest x-ray 10/14/2019, 10/16/2019, 10/18/2019  Lexiscan Myoview  10/17/2019  2D echo 10/15/2019  Ultrasound-guided therapeutic left and right thoracentesis 10/16/2019 per Dr. Annamaria Boots IR  Transurethral resection of prostate per Dr. Jeffie Pollock 10/20/2019  Consultations:  Nephrology: Dr.Schertz 10/17/2019  Cardiology: Dr. Johnsie Cancel 10/16/2019  Discharge Exam: Vitals:   10/22/19 0609 10/22/19 1440  BP: (!) 146/78 (!) 154/76  Pulse: 65 63  Resp: 16 16  Temp: 99 F (37.2 C) 98.2 F (36.8 C)  SpO2: 99% 100%    General: NAD Cardiovascular: RRR Respiratory: CTAB  Discharge Instructions   Discharge Instructions    Diet - low sodium heart healthy   Complete by: As directed    Increase activity slowly   Complete by: As directed      Allergies as of 10/22/2019   No Known Allergies     Medication List    STOP taking these medications   amLODipine 10 MG tablet Commonly known as: NORVASC   finasteride 5 MG tablet Commonly known as: PROSCAR   metoprolol tartrate 25 MG tablet Commonly known as: LOPRESSOR   tamsulosin 0.4 MG Caps capsule Commonly known as: FLOMAX     TAKE these medications   Accu-Chek FastClix Lancets Misc Check blood sugar up to 7 times a week as instructed What changed:   how much to take  how to take this  when to take this   Accu-Chek Guide w/Device Kit 1 each by Does not apply route daily. Check blood sugar as instructed up to 7 times a week What changed: when to take  this   aspirin EC 81 MG tablet Take 1 tablet (81 mg total) by mouth daily.   atorvastatin 80 MG tablet Commonly known as: LIPITOR Take 1 tablet (80 mg total) by mouth at bedtime. IM program   dorzolamide-timolol 22.3-6.8 MG/ML ophthalmic solution Commonly known as: COSOPT Place 1 drop into the right eye 2 (two) times daily.   ferrous gluconate 324 MG tablet Commonly known as: FERGON Take 324 mg by mouth daily  with breakfast.   furosemide 80 MG tablet Commonly known as: LASIX Take 1 tablet (80 mg total) by mouth daily. Start taking on: October 23, 2019 What changed:   medication strength  how much to take  when to take this   glucose blood test strip Commonly known as: Accu-Chek Guide Check blood sugar up to 7 times a week as instructed What changed:   how much to take  how to take this  when to take this   hydrALAZINE 25 MG tablet Commonly known as: APRESOLINE Take 1.5 tablets (37.5 mg total) by mouth 3 (three) times daily.   isosorbide dinitrate 20 MG tablet Commonly known as: ISORDIL Take 1 tablet (20 mg total) by mouth 3 (three) times daily.   metoprolol succinate 50 MG 24 hr tablet Commonly known as: TOPROL-XL Take 1 tablet (50 mg total) by mouth daily. Take with or immediately following a meal. Start taking on: October 23, 2019   sodium bicarbonate 650 MG tablet Take 650 mg by mouth 2 (two) times daily.   vitamin B-12 1000 MCG tablet Commonly known as: CYANOCOBALAMIN Take 1,000 mcg by mouth daily.      No Known Allergies Follow-up Information    [facility] Follow up.   Specialty: [place] Why: [facility] physical therapy Contact information: [place] [address] [phone]        [name], MD. Schedule an appointment as soon as possible for a visit in 2 week(s).   Specialty: Internal Medicine Contact information: [address] [place] [address] [phone]        Jettie Booze, MD. Schedule an appointment as soon as possible for a visit in 2 week(s).   Specialties: Cardiology, Radiology, Interventional Cardiology Why: f/u in 2-3 weeks. Contact information: [address] [place] [address] [phone]        Irine Seal, MD. Schedule an appointment as soon as possible for a visit in 1 week(s).   Specialty: Urology Contact information: [place] [address] [phone]            The results of significant diagnostics from this hospitalization (including imaging, microbiology, ancillary and laboratory) are listed below for reference.    Significant Diagnostic Studies: DG Chest 1 View  Result Date: 10/18/2019 CLINICAL DATA:  Post thoracentesis. 1.5L off right side. Hx of diabetes, CVA, hypertension, tachycardia. Former smoker(1980). EXAM: CHEST  1 VIEW COMPARISON:  10/16/2019 FINDINGS: No pneumothorax. No evident residual right pleural effusion. Central pulmonary vascular congestion with some increase in interstitial opacities at the left lung base. Heart size and mediastinal contours are within normal limits. Aortic Atherosclerosis (ICD10-170.0). Vertebral endplate spurring at multiple levels in the mid and lower thoracic spine. IMPRESSION: No pneumothorax post right thoracentesis. Electronically Signed   By: Lucrezia Europe M.D.   On: 10/18/2019 11:34   DG Chest 1 View  Result Date: 10/16/2019 CLINICAL DATA:  Post  thoracentesis-pt has no chest complaints EXAM:  CHEST  1 VIEW COMPARISON:  10/14/2019 FINDINGS: No pneumothorax. Interval decrease in left effusion, with small residual. Improved aeration at the left lung base. Persistent layering right pleural effusion. Heart size upper limits normal. Aortic Atherosclerosis (ICD10-170.0). Vertebral endplate spurring at multiple levels in the lower thoracic spine. IMPRESSION: No pneumothorax post left thoracentesis. Electronically Signed   By: Lucrezia Europe M.D.   On: 10/16/2019 16:25   DG Chest 2 View  Result Date: 10/14/2019 CLINICAL DATA:  Shortness of breath EXAM: CHEST - 2 VIEW COMPARISON:  09/21/2019 FINDINGS: Cardiomegaly. Bilateral pleural effusions, left greater than right. Vascular congestion. Bilateral lower lobe airspace opacities could reflect edema or pneumonia. IMPRESSION: Moderate left effusion and small right effusion. Bilateral lower lobe edema or pneumonia.  Cardiomegaly, vascular congestion. Electronically Signed   By: Rolm Baptise M.D.   On: 10/14/2019 23:09   NM Myocar Multi W/Spect W/Wall Motion / EF  Result Date: 10/17/2019 CLINICAL DATA:  Renal disease, abnormal EKG.  69 year old male EXAM: MYOCARDIAL IMAGING WITH SPECT (REST AND PHARMACOLOGIC-STRESS) GATED LEFT VENTRICULAR WALL MOTION STUDY LEFT VENTRICULAR EJECTION FRACTION TECHNIQUE: Standard myocardial SPECT imaging was performed after resting intravenous injection of 10 mCi Tc-39mtetrofosmin. Subsequently, intravenous infusion of Lexiscan was performed under the supervision of the Cardiology staff. At peak effect of the drug, 30 mCi Tc-966metrofosmin was injected intravenously and standard myocardial SPECT imaging was performed. Quantitative gated imaging was also performed to evaluate left ventricular wall motion, and estimate left ventricular ejection fraction. COMPARISON:  None. FINDINGS: Perfusion: There is a large region moderate to severe decreased perfusion involving the apex, apical segments and mid anterior septal wall and mid inferior wall. Decreased apical perfusion improves from stress to rest but does not normalize at the apex. Wall Motion: Global hypokinesia. LEFT ventricular dilatation. LEFT ventricular dilatation appears increased from rest to stress. Left Ventricular Ejection Fraction: 40 % End diastolic volume 19741l End systolic volume 11287l IMPRESSION: 1. Findings most consistent with LEFT ventricular apex infarction with peri-infarct ischemia most severe in the anterior wall but also involving the inferior wall. Overall large defect with moderate decreased perfusion. 2. Global hypokinesia.  Transient LEFT ventricular dilatation. 3. Left ventricular ejection fraction 40% 4. Non invasive risk stratification*: High *2012 Appropriate Use Criteria for Coronary Revascularization Focused Update: J Am Coll Cardiol. 208676;72(0):947-096 http://content.onairportbarriers.comspx?articleid=1201161 Insert PRA call Electronically Signed   By: StSuzy Bouchard.D.   On: 10/17/2019 11:31   ECHOCARDIOGRAM COMPLETE  Result Date: 10/15/2019    ECHOCARDIOGRAM REPORT   Patient Name:   Dennis MANIACIate of Exam: 10/15/2019 Medical Rec #:  [id number]:       72.0 in Accession #:    [id number]:       170.0 lb Date of Birth:  [birth year]BSA:          1.988 m Patient Age:    [age]   BP:           138/87 mmHg Patient Gender: M          HR:           66 bpm. Exam Location:  Inpatient Procedure: 2D Echo, Cardiac Doppler and Color Doppler Indications:    Dyspnea 786.09 / R06.00  History:        Patient has prior history of Echocardiogram examinations, most                 recent 09/14/2015. Stroke; Risk Factors:Hypertension, Diabetes and  Dyslipidemia.  Sonographer:    Tiffany Dance Referring Phys: [id number] [place]  1. Left ventricular ejection fraction, by estimation, is 40 to 45%. The left ventricle has mildly decreased function. The left ventricle demonstrates regional wall motion abnormalities (see scoring diagram/findings for description). There is mild asymmetric left ventricular hypertrophy of the septal segment. Left ventricular diastolic parameters are consistent with Grade I diastolic dysfunction (impaired relaxation).  2. Right ventricular systolic function is normal. The right ventricular size is normal. There is mildly elevated pulmonary artery systolic pressure.  3. Left atrial size was severely dilated.  4. Moderate pleural effusion.  5. The mitral valve is normal in structure. Mild mitral valve regurgitation. No evidence of mitral stenosis.  6. The aortic valve is tricuspid. Aortic valve regurgitation is not visualized. Mild to moderate aortic valve sclerosis/calcification is present, without any evidence of aortic stenosis.  7. The inferior vena cava is normal in size with greater than 50% respiratory  variability, suggesting right atrial pressure of 3 mmHg. Comparison(s): Changes from prior study are noted. The left ventricular function is worsened. FINDINGS  Left Ventricle: Left ventricular ejection fraction, by estimation, is 40 to 45%. The left ventricle has mildly decreased function. The left ventricle demonstrates regional wall motion abnormalities. The left ventricular internal cavity size was normal in size. There is mild asymmetric left ventricular hypertrophy of the septal segment. Left ventricular diastolic parameters are consistent with Grade I diastolic dysfunction (impaired relaxation).  LV Wall Scoring: The entire apex is akinetic. Right Ventricle: The right ventricular size is normal. No increase in right ventricular wall thickness. Right ventricular systolic function is normal. There is mildly elevated pulmonary artery systolic pressure. The tricuspid regurgitant velocity is 2.95  m/s, and with an assumed right atrial pressure of 3 mmHg, the estimated right ventricular systolic pressure is 67.1 mmHg. Left Atrium: Left atrial size was severely dilated. Right Atrium: Right atrial size was normal in size. Pericardium: Trivial pericardial effusion is present. Mitral Valve: The mitral valve is normal in structure. Normal mobility of the mitral valve leaflets. Mild mitral valve regurgitation. No evidence of mitral valve stenosis. Tricuspid Valve: The tricuspid valve is normal in structure. Tricuspid valve regurgitation is not demonstrated. No evidence of tricuspid stenosis. Aortic Valve: The aortic valve is tricuspid. Aortic valve regurgitation is not visualized. Mild to moderate aortic valve sclerosis/calcification is present, without any evidence of aortic stenosis. Pulmonic Valve: The pulmonic valve was normal in structure. Pulmonic valve regurgitation is not visualized. No evidence of pulmonic stenosis. Aorta: The aortic root is normal in size and structure. Venous: The inferior vena cava is normal  in size with greater than 50% respiratory variability, suggesting right atrial pressure of 3 mmHg. IAS/Shunts: No atrial level shunt detected by color flow Doppler. Additional Comments: There is a moderate pleural effusion. Moderate ascites is present.  LEFT VENTRICLE PLAX 2D LVIDd:         4.80 cm  Diastology LVIDs:         3.30 cm  LV e' lateral:   5.10 cm/s LV PW:         1.10 cm  LV E/e' lateral: 18.3 LV IVS:        1.20 cm  LV e' medial:    4.20 cm/s LVOT diam:     2.20 cm  LV E/e' medial:  22.2 LV SV:         67 LV SV Index:   34 LVOT Area:     3.80 cm  RIGHT VENTRICLE             IVC RV Basal diam:  3.20 cm     IVC diam: 1.80 cm RV Mid diam:    2.20 cm RV S prime:     10.30 cm/s TAPSE (M-mode): 2.1 cm LEFT ATRIUM            Index       RIGHT ATRIUM           Index LA diam:      3.80 cm  1.91 cm/m  RA Area:     11.20 cm LA Vol (A2C): 103.0 ml 51.80 ml/m RA Volume:   21.60 ml  10.86 ml/m LA Vol (A4C): 101.0 ml 50.80 ml/m  AORTIC VALVE LVOT Vmax:   82.00 cm/s LVOT Vmean:  50.300 cm/s LVOT VTI:    0.177 m  AORTA Ao Root diam: 3.60 cm Ao Asc diam:  3.30 cm MITRAL VALVE               TRICUSPID VALVE MV Area (PHT): 4.39 cm    TR Peak grad:   34.8 mmHg MV Decel Time: 173 msec    TR Vmax:        295.00 cm/s MV E velocity: 93.10 cm/s MV A velocity: 83.90 cm/s  SHUNTS MV E/A ratio:  1.11        Systemic VTI:  0.18 m                            Systemic Diam: 2.20 cm Candee Furbish MD Electronically signed by Candee Furbish MD Signature Date/Time: 10/15/2019/5:06:01 PM    Final    IR THORACENTESIS ASP PLEURAL SPACE W/IMG GUIDE  Result Date: 10/16/2019 INDICATION: Patient with history of CKD stage 4, dyspnea, and recurrent bilateral pleural effusions, left>right. Request is made for therapeutic left thoracentesis. EXAM: ULTRASOUND GUIDED THERAPEUTIC LEFT THORACENTESIS MEDICATIONS: 7 mL 1% lidocaine COMPLICATIONS: None immediate. PROCEDURE: An ultrasound guided thoracentesis was thoroughly discussed with the patient and  questions answered. The benefits, risks, alternatives and complications were also discussed. The patient understands and wishes to proceed with the procedure. Written consent was obtained. Ultrasound was performed to localize and mark an adequate pocket of fluid in the left chest. The area was then prepped and draped in the normal sterile fashion. 1% Lidocaine was used for local anesthesia. Under ultrasound guidance a 6 Fr Safe-T-Centesis catheter was introduced. Thoracentesis was performed. The catheter was removed and a dressing applied. FINDINGS: A total of approximately 1.35 L of clear gold fluid was removed. IMPRESSION: Successful ultrasound guided left thoracentesis yielding 1.35 L of pleural fluid. Read by: Earley Abide, PA-C Electronically Signed   By: Jerilynn Mages.  Shick M.D.   On: 10/16/2019 16:16   US THORACENTESIS ASP PLEURAL SPACE W/IMG GUIDE  Result Date: 10/18/2019 INDICATION: Patient history of chronic kidney disease with recurrent bilateral pleural effusions presents for right sided therapeutic thoracentesis EXAM: ULTRASOUND GUIDED THERAPEUTIC  THORACENTESIS MEDICATIONS: Lidocaine 1% 10 mL COMPLICATIONS: None immediate. PROCEDURE: An ultrasound guided thoracentesis was thoroughly discussed with the patient and questions answered. The benefits, risks, alternatives and complications were also discussed. The patient understands and wishes to proceed with the procedure. Written consent was obtained. Ultrasound was performed to localize and mark an adequate pocket of fluid in the right side chest. The area was then prepped and draped in the normal sterile fashion. 1% Lidocaine was used for local anesthesia. Under ultrasound guidance a  6 Fr Safe-T-Centesis catheter was introduced. Thoracentesis was performed. The catheter was removed and a dressing applied. FINDINGS: A total of approximately 1.5 L of straw-colored fluid was removed. IMPRESSION: Successful ultrasound guided therapeutic right-sided  thoracentesis yielding 1.5 L of pleural fluid. Read by Rushie Nyhan NP Electronically Signed   By: Lucrezia Europe M.D.   On: 10/18/2019 11:37    Microbiology: Recent Results (from the past 240 hour(s))  SARS CORONAVIRUS 2 (TAT 6-24 HRS) Nasopharyngeal Nasopharyngeal Swab     Status: None   Collection Time: 10/15/19  6:35 AM   Specimen: Nasopharyngeal Swab  Result Value Ref Range Status   SARS Coronavirus 2 NEGATIVE NEGATIVE Final    Comment: (NOTE) SARS-CoV-2 target nucleic acids are NOT DETECTED. The SARS-CoV-2 RNA is generally detectable in upper and lower respiratory specimens during the acute phase of infection. Negative results do not preclude SARS-CoV-2 infection, do not rule out co-infections with other pathogens, and should not be used as the sole basis for treatment or other patient management decisions. Negative results must be combined with clinical observations, patient history, and epidemiological information. The expected result is Negative. Fact Sheet for Patients: SugarRoll.be Fact Sheet for Healthcare Providers: https://www.woods-mathews.com/ This test is not yet approved or cleared by the [place] FDA and  has been authorized for detection and/or diagnosis of SARS-CoV-2 by FDA under an Emergency Use Authorization (EUA). This EUA will remain  in effect (meaning this test can be used) for the duration of the COVID-19 declaration under Section 56 4(b)(1) of the Act, 21 U.S.C. section 360bbb-3(b)(1), unless the authorization is terminated or revoked sooner. Performed at [facility], [place] [address], [place], [place] [address]   Surgical pcr screen     Status: None   Collection Time: 10/19/19  6:55 PM   Specimen: Nasal Mucosa; Nasal Swab  Result Value Ref Range Status   MRSA, PCR NEGATIVE NEGATIVE Final   Staphylococcus aureus NEGATIVE NEGATIVE Final    Comment: (NOTE) The Xpert SA Assay (FDA approved for NASAL  specimens in patients 69 years of age and older), is one component of a comprehensive surveillance program. It is not intended to diagnose infection nor to guide or monitor treatment. Performed at [facility], [place] [address], [place], [place] [address]      Labs: Basic Metabolic Panel: Recent Labs  Lab 10/18/19 1642 10/19/19 0553 10/20/19 0503 10/21/19 0454 10/22/19 0505  NA 140 140 139 139 139  K 3.8 4.0 4.0 4.3 4.5  CL 109 107 107 108 108  CO2 _0 GLUCOSE 195* 112* 112* 111* 123*  BUN 42* 43* 47* 49* 50*  CREATININE 3.49* 3.52* 3.45* 3.62* 3.55*  CALCIUM 8.4* 8.4* 8.5* 8.3* 8.3*  PHOS  --   --   --   --  5.1*   Liver Function Tests: Recent Labs  Lab 10/17/19 1647 10/17/19 1647 10/18/19 0454 10/18/19 1642 10/19/19 0553 10/20/19 0503 10/22/19 0505  AST 13*  --  13* 13* 12* 12*  --   ALT 11  --  _1 --   ALKPHOS 46  --  51 48 43 42  --   BILITOT 0.2*  --  0.5 0.6 0.3 0.4  --   PROT 5.3*  --  5.0* 5.1* 5.1* 5.1*  --   ALBUMIN 2.4*   < > 2.4* 2.5* 2.4* 2.7* 2.4*   < > = values in this interval not displayed.  No results for input(s): LIPASE, AMYLASE in the last 168 hours. No results for input(s): AMMONIA in the last 168 hours. CBC: Recent Labs  Lab 10/18/19 0454 10/19/19 0553 10/20/19 0503 10/21/19 0454 10/22/19 0505  WBC 5.1 4.6 4.6 5.5 5.3  HGB 8.6* 8.3* 8.6* 8.4* 8.4*  HCT 26.5* 26.2* 26.8* 26.9* 26.2*  MCV 91.4 93.9 94.0 94.7 94.6  PLT 198 203 201 201 216   Cardiac Enzymes: No results for input(s): CKTOTAL, CKMB, CKMBINDEX, TROPONINI in the last 168 hours. BNP: BNP (last 3 results) Recent Labs    10/15/19 0610  BNP 1,820.0*    ProBNP (last 3 results) No results for input(s): PROBNP in the last 8760 hours.  CBG: Recent Labs  Lab 10/20/19 0902  GLUCAP 95       Signed:  Irine Seal MD.  [facility] 10/22/2019, 2:41 PM

## 2019-10-22 NOTE — TOC Transition Note (Signed)
Transition of Care [facility]) - CM/SW Discharge Note   Patient Details  Name: Dennis Macias MRN: [id number] Date of Birth: 11/07/1950  [facility]) CM/SW Contact:  Dessa Phi, RN Phone Number: 10/22/2019, 10:26 AM   Clinical Narrative: d/c home w/HHPT only rep [facility] aware of d/c & HHTP order. No HHRN or HHOT needed-per nsg & OT note. MD/patient in agreement with d/c plan. No further CM needs.     Final next level of care: [place] Barriers to Discharge: No Barriers Identified   Patient Goals and CMS Choice Patient states their goals for this hospitalization and ongoing recovery are:: to return to home CMS Medicare.gov Compare Post Acute Care list provided to:: Patient Choice offered to / list presented to : Patient  Discharge Placement                       Discharge Plan and Services   Discharge Planning Services: CM Consult Post Acute Care Choice: Durable Medical Equipment, Home Health          DME Arranged: N/A         HH Arranged: PT HH Agency: [place] Date [facility] Agency Contacted: 10/22/19 Time HH Agency Contacted: 1914 Representative spoke with at [place]: [place] ([place]) Interventions     Readmission Risk Interventions Readmission Risk Prevention Plan 08/10/2019  Transportation Screening Complete  PCP or Specialist Appt within 3-5 Days Complete  HRI or [place] Complete  Social Work Consult for [place] Planning/Counseling Complete  Palliative Care Screening Complete  Medication Review Press photographer) Complete  Some recent data might be hidden

## 2019-10-23 ENCOUNTER — Telehealth: Payer: Self-pay | Admitting: *Deleted

## 2019-10-23 DIAGNOSIS — I255 Ischemic cardiomyopathy: Secondary | ICD-10-CM | POA: Diagnosis not present

## 2019-10-23 DIAGNOSIS — N049 Nephrotic syndrome with unspecified morphologic changes: Secondary | ICD-10-CM | POA: Diagnosis not present

## 2019-10-23 DIAGNOSIS — D510 Vitamin B12 deficiency anemia due to intrinsic factor deficiency: Secondary | ICD-10-CM | POA: Diagnosis not present

## 2019-10-23 DIAGNOSIS — N184 Chronic kidney disease, stage 4 (severe): Secondary | ICD-10-CM | POA: Diagnosis not present

## 2019-10-23 DIAGNOSIS — I69354 Hemiplegia and hemiparesis following cerebral infarction affecting left non-dominant side: Secondary | ICD-10-CM | POA: Diagnosis not present

## 2019-10-23 DIAGNOSIS — I130 Hypertensive heart and chronic kidney disease with heart failure and stage 1 through stage 4 chronic kidney disease, or unspecified chronic kidney disease: Secondary | ICD-10-CM | POA: Diagnosis not present

## 2019-10-23 DIAGNOSIS — I5041 Acute combined systolic (congestive) and diastolic (congestive) heart failure: Secondary | ICD-10-CM | POA: Diagnosis not present

## 2019-10-23 DIAGNOSIS — Z466 Encounter for fitting and adjustment of urinary device: Secondary | ICD-10-CM | POA: Diagnosis not present

## 2019-10-23 DIAGNOSIS — E1122 Type 2 diabetes mellitus with diabetic chronic kidney disease: Secondary | ICD-10-CM | POA: Diagnosis not present

## 2019-10-23 LAB — SURGICAL PATHOLOGY

## 2019-10-23 NOTE — Telephone Encounter (Signed)
[place] PT calls for VO 1x week for 4 weeks for safety, strengthening Do you agree?

## 2019-10-24 NOTE — Telephone Encounter (Signed)
I agree

## 2019-10-25 DIAGNOSIS — E1122 Type 2 diabetes mellitus with diabetic chronic kidney disease: Secondary | ICD-10-CM | POA: Diagnosis not present

## 2019-10-25 DIAGNOSIS — I130 Hypertensive heart and chronic kidney disease with heart failure and stage 1 through stage 4 chronic kidney disease, or unspecified chronic kidney disease: Secondary | ICD-10-CM | POA: Diagnosis not present

## 2019-10-25 DIAGNOSIS — I5041 Acute combined systolic (congestive) and diastolic (congestive) heart failure: Secondary | ICD-10-CM | POA: Diagnosis not present

## 2019-10-25 DIAGNOSIS — Z466 Encounter for fitting and adjustment of urinary device: Secondary | ICD-10-CM | POA: Diagnosis not present

## 2019-10-25 DIAGNOSIS — N184 Chronic kidney disease, stage 4 (severe): Secondary | ICD-10-CM | POA: Diagnosis not present

## 2019-10-25 DIAGNOSIS — I255 Ischemic cardiomyopathy: Secondary | ICD-10-CM | POA: Diagnosis not present

## 2019-10-25 DIAGNOSIS — N049 Nephrotic syndrome with unspecified morphologic changes: Secondary | ICD-10-CM | POA: Diagnosis not present

## 2019-10-25 DIAGNOSIS — I69354 Hemiplegia and hemiparesis following cerebral infarction affecting left non-dominant side: Secondary | ICD-10-CM | POA: Diagnosis not present

## 2019-10-25 DIAGNOSIS — D510 Vitamin B12 deficiency anemia due to intrinsic factor deficiency: Secondary | ICD-10-CM | POA: Diagnosis not present

## 2019-10-26 NOTE — Telephone Encounter (Signed)
[employer] auth for [id number], [id number], [phone] is [id number] (exp. 10/15/19 to 11/14/19)  Marvis Repress for [phone] is [id number] (exp. 10/15/19 to 11/14/19)  Order's [facility]ed to [facility] they will reach out to the patient to schedule.

## 2019-10-27 NOTE — Telephone Encounter (Signed)
scheduled for 11/09/19 at [facility]

## 2019-10-28 DIAGNOSIS — N049 Nephrotic syndrome with unspecified morphologic changes: Secondary | ICD-10-CM | POA: Diagnosis not present

## 2019-10-28 DIAGNOSIS — N184 Chronic kidney disease, stage 4 (severe): Secondary | ICD-10-CM | POA: Diagnosis not present

## 2019-10-28 DIAGNOSIS — I69354 Hemiplegia and hemiparesis following cerebral infarction affecting left non-dominant side: Secondary | ICD-10-CM | POA: Diagnosis not present

## 2019-10-28 DIAGNOSIS — Z466 Encounter for fitting and adjustment of urinary device: Secondary | ICD-10-CM | POA: Diagnosis not present

## 2019-10-28 DIAGNOSIS — D510 Vitamin B12 deficiency anemia due to intrinsic factor deficiency: Secondary | ICD-10-CM | POA: Diagnosis not present

## 2019-10-28 DIAGNOSIS — I130 Hypertensive heart and chronic kidney disease with heart failure and stage 1 through stage 4 chronic kidney disease, or unspecified chronic kidney disease: Secondary | ICD-10-CM | POA: Diagnosis not present

## 2019-10-28 DIAGNOSIS — I5041 Acute combined systolic (congestive) and diastolic (congestive) heart failure: Secondary | ICD-10-CM | POA: Diagnosis not present

## 2019-10-28 DIAGNOSIS — I255 Ischemic cardiomyopathy: Secondary | ICD-10-CM | POA: Diagnosis not present

## 2019-10-28 DIAGNOSIS — E1122 Type 2 diabetes mellitus with diabetic chronic kidney disease: Secondary | ICD-10-CM | POA: Diagnosis not present

## 2019-10-31 ENCOUNTER — Telehealth: Payer: Self-pay | Admitting: Internal Medicine

## 2019-10-31 NOTE — Telephone Encounter (Signed)
   Reason for call:   I received a call from Mr. Kalub Morillo 's spouse Joanne Brander at 9 AM with question regarding constipation.   Pertinent Data:   Mr.Daishawn Branden's wife called the on-call line for question regarding Mr.Mccarn. She mentions that he has not had a bowel movement for the last couple days. She mentions previously giving Miralax for constipation but states she was afraid to give it to him after his TURP because she wasn't sure if it would interact with his prior procedure or current meds.   Assessment / Plan / Recommendations:   Reassured Mrs.Prout that Miralax is safe to use. Advised to continue Miralax for constipation.  As always, pt is advised that if symptoms worsen or new symptoms arise, they should go to an urgent care facility or to to ER for further evaluation.   Mosetta Anis, MD   10/31/2019, 8:59 AM

## 2019-11-02 ENCOUNTER — Other Ambulatory Visit: Payer: Self-pay

## 2019-11-02 ENCOUNTER — Encounter: Payer: Self-pay | Admitting: Podiatry

## 2019-11-02 ENCOUNTER — Telehealth: Payer: Self-pay | Admitting: *Deleted

## 2019-11-02 ENCOUNTER — Ambulatory Visit: Payer: Medicare PPO | Admitting: Podiatry

## 2019-11-02 VITALS — Temp 97.9°F

## 2019-11-02 DIAGNOSIS — B351 Tinea unguium: Secondary | ICD-10-CM | POA: Diagnosis not present

## 2019-11-02 DIAGNOSIS — M4802 Spinal stenosis, cervical region: Secondary | ICD-10-CM | POA: Insufficient documentation

## 2019-11-02 DIAGNOSIS — G992 Myelopathy in diseases classified elsewhere: Secondary | ICD-10-CM | POA: Insufficient documentation

## 2019-11-02 DIAGNOSIS — M79674 Pain in right toe(s): Secondary | ICD-10-CM | POA: Diagnosis not present

## 2019-11-02 DIAGNOSIS — M79675 Pain in left toe(s): Secondary | ICD-10-CM | POA: Diagnosis not present

## 2019-11-02 DIAGNOSIS — I10 Essential (primary) hypertension: Secondary | ICD-10-CM | POA: Diagnosis not present

## 2019-11-02 DIAGNOSIS — L84 Corns and callosities: Secondary | ICD-10-CM

## 2019-11-02 DIAGNOSIS — E1142 Type 2 diabetes mellitus with diabetic polyneuropathy: Secondary | ICD-10-CM

## 2019-11-02 NOTE — Telephone Encounter (Signed)
   [facility] Pre-operative Risk Assessment    Request for surgical clearance:  1. What type of surgery is being performed? CERVICAL FUSION   2. When is this surgery scheduled? TBD   3. What type of clearance is required (medical clearance vs. Pharmacy clearance to hold med vs. Both)? MEDICAL  4. Are there any medications that need to be held prior to surgery and how long? ASA    5. Practice name and name of physician performing surgery? [facility]; DR. Roderic Palau THOMAS   6. What is your office phone number [phone] ; EXT 221   7.   What is your office fax number [phone] ATTN; NIKKI  8.   Anesthesia type (None, local, MAC, general) ? GENERAL   Julaine Hua 11/02/2019, 4:18 PM  _________________________________________________________________   (provider comments below)

## 2019-11-02 NOTE — Patient Instructions (Signed)
Diabetes Mellitus and Foot Care Foot care is an important part of your health, especially when you have diabetes. Diabetes may cause you to have problems because of poor blood flow (circulation) to your feet and legs, which can cause your skin to:  Become thinner and drier.  Break more easily.  Heal more slowly.  Peel and crack. You may also have nerve damage (neuropathy) in your legs and feet, causing decreased feeling in them. This means that you may not notice minor injuries to your feet that could lead to more serious problems. Noticing and addressing any potential problems early is the best way to prevent future foot problems. How to care for your feet Foot hygiene  Wash your feet daily with warm water and mild soap. Do not use hot water. Then, pat your feet and the areas between your toes until they are completely dry. Do not soak your feet as this can dry your skin.  Trim your toenails straight across. Do not dig under them or around the cuticle. File the edges of your nails with an emery board or nail file.  Apply a moisturizing lotion or petroleum jelly to the skin on your feet and to dry, brittle toenails. Use lotion that does not contain alcohol and is unscented. Do not apply lotion between your toes. Shoes and socks  Wear clean socks or stockings every day. Make sure they are not too tight. Do not wear knee-high stockings since they may decrease blood flow to your legs.  Wear shoes that fit properly and have enough cushioning. Always look in your shoes before you put them on to be sure there are no objects inside.  To break in new shoes, wear them for just a few hours a day. This prevents injuries on your feet. Wounds, scrapes, corns, and calluses  Check your feet daily for blisters, cuts, bruises, sores, and redness. If you cannot see the bottom of your feet, use a mirror or ask someone for help.  Do not cut corns or calluses or try to remove them with medicine.  If you  find a minor scrape, cut, or break in the skin on your feet, keep it and the skin around it clean and dry. You may clean these areas with mild soap and water. Do not clean the area with peroxide, alcohol, or iodine.  If you have a wound, scrape, corn, or callus on your foot, look at it several times a day to make sure it is healing and not infected. Check for: ? Redness, swelling, or pain. ? Fluid or blood. ? Warmth. ? Pus or a bad smell. General instructions  Do not cross your legs. This may decrease blood flow to your feet.  Do not use heating pads or hot water bottles on your feet. They may burn your skin. If you have lost feeling in your feet or legs, you may not know this is happening until it is too late.  Protect your feet from hot and cold by wearing shoes, such as at the beach or on hot pavement.  Schedule a complete foot exam at least once a year (annually) or more often if you have foot problems. If you have foot problems, report any cuts, sores, or bruises to your health care provider immediately. Contact a health care provider if:  You have a medical condition that increases your risk of infection and you have any cuts, sores, or bruises on your feet.  You have an injury that is not   healing.  You have redness on your legs or feet.  You feel burning or tingling in your legs or feet.  You have pain or cramps in your legs and feet.  Your legs or feet are numb.  Your feet always feel cold.  You have pain around a toenail. Get help right away if:  You have a wound, scrape, corn, or callus on your foot and: ? You have pain, swelling, or redness that gets worse. ? You have fluid or blood coming from the wound, scrape, corn, or callus. ? Your wound, scrape, corn, or callus feels warm to the touch. ? You have pus or a bad smell coming from the wound, scrape, corn, or callus. ? You have a fever. ? You have a red line going up your leg. Summary  Check your feet every day  for cuts, sores, red spots, swelling, and blisters.  Moisturize feet and legs daily.  Wear shoes that fit properly and have enough cushioning.  If you have foot problems, report any cuts, sores, or bruises to your health care provider immediately.  Schedule a complete foot exam at least once a year (annually) or more often if you have foot problems. This information is not intended to replace advice given to you by your health care provider. Make sure you discuss any questions you have with your health care provider. Document Revised: 03/18/2019 Document Reviewed: 07/27/2016 Elsevier Patient Education  2020 Elsevier Inc.  Peripheral Neuropathy Peripheral neuropathy is a type of nerve damage. It affects nerves that carry signals between the spinal cord and the arms, legs, and the rest of the body (peripheral nerves). It does not affect nerves in the spinal cord or brain. In peripheral neuropathy, one nerve or a group of nerves may be damaged. Peripheral neuropathy is a broad category that includes many specific nerve disorders, like diabetic neuropathy, hereditary neuropathy, and carpal tunnel syndrome. What are the causes? This condition may be caused by:  Diabetes. This is the most common cause of peripheral neuropathy.  Nerve injury.  Pressure or stress on a nerve that lasts a long time.  Lack (deficiency) of B vitamins. This can result from alcoholism, poor diet, or a restricted diet.  Infections.  Autoimmune diseases, such as rheumatoid arthritis and systemic lupus erythematosus.  Nerve diseases that are passed from parent to child (inherited).  Some medicines, such as cancer medicines (chemotherapy).  Poisonous (toxic) substances, such as lead and mercury.  Too little blood flowing to the legs.  Kidney disease.  Thyroid disease. In some cases, the cause of this condition is not known. What are the signs or symptoms? Symptoms of this condition depend on which of your  nerves is damaged. Common symptoms include:  Loss of feeling (numbness) in the feet, hands, or both.  Tingling in the feet, hands, or both.  Burning pain.  Very sensitive skin.  Weakness.  Not being able to move a part of the body (paralysis).  Muscle twitching.  Clumsiness or poor coordination.  Loss of balance.  Not being able to control your bladder.  Feeling dizzy.  Sexual problems. How is this diagnosed? Diagnosing and finding the cause of peripheral neuropathy can be difficult. Your health care provider will take your medical history and do a physical exam. A neurological exam will also be done. This involves checking things that are affected by your brain, spinal cord, and nerves (nervous system). For example, your health care provider will check your reflexes, how you move, and   what you can feel. You may have other tests, such as:  Blood tests.  Electromyogram (EMG) and nerve conduction tests. These tests check nerve function and how well the nerves are controlling the muscles.  Imaging tests, such as CT scans or MRI to rule out other causes of your symptoms.  Removing a small piece of nerve to be examined in a lab (nerve biopsy). This is rare.  Removing and examining a small amount of the fluid that surrounds the brain and spinal cord (lumbar puncture). This is rare. How is this treated? Treatment for this condition may involve:  Treating the underlying cause of the neuropathy, such as diabetes, kidney disease, or vitamin deficiencies.  Stopping medicines that can cause neuropathy, such as chemotherapy.  Medicine to relieve pain. Medicines may include: ? Prescription or over-the-counter pain medicine. ? Antiseizure medicine. ? Antidepressants. ? Pain-relieving patches that are applied to painful areas of skin.  Surgery to relieve pressure on a nerve or to destroy a nerve that is causing pain.  Physical therapy to help improve movement and  balance.  Devices to help you move around (assistive devices). Follow these instructions at home: Medicines  Take over-the-counter and prescription medicines only as told by your health care provider. Do not take any other medicines without first asking your health care provider.  Do not drive or use heavy machinery while taking prescription pain medicine. Lifestyle   Do not use any products that contain nicotine or tobacco, such as cigarettes and e-cigarettes. Smoking keeps blood from reaching damaged nerves. If you need help quitting, ask your health care provider.  Avoid or limit alcohol. Too much alcohol can cause a vitamin B deficiency, and vitamin B is needed for healthy nerves.  Eat a healthy diet. This includes: ? Eating foods that are high in fiber, such as fresh fruits and vegetables, whole grains, and beans. ? Limiting foods that are high in fat and processed sugars, such as fried or sweet foods. General instructions   If you have diabetes, work closely with your health care provider to keep your blood sugar under control.  If you have numbness in your feet: ? Check every day for signs of injury or infection. Watch for redness, warmth, and swelling. ? Wear padded socks and comfortable shoes. These help protect your feet.  Develop a good support system. Living with peripheral neuropathy can be stressful. Consider talking with a mental health specialist or joining a support group.  Use assistive devices and attend physical therapy as told by your health care provider. This may include using a walker or a cane.  Keep all follow-up visits as told by your health care provider. This is important. Contact a health care provider if:  You have new signs or symptoms of peripheral neuropathy.  You are struggling emotionally from dealing with peripheral neuropathy.  Your pain is not well-controlled. Get help right away if:  You have an injury or infection that is not healing  normally.  You develop new weakness in an arm or leg.  You fall frequently. Summary  Peripheral neuropathy is when the nerves in the arms, or legs are damaged, resulting in numbness, weakness, or pain.  There are many causes of peripheral neuropathy, including diabetes, pinched nerves, vitamin deficiencies, autoimmune disease, and hereditary conditions.  Diagnosing and finding the cause of peripheral neuropathy can be difficult. Your health care provider will take your medical history, do a physical exam, and do tests, including blood tests and nerve function tests.    Treatment involves treating the underlying cause of the neuropathy and taking medicines to help control pain. Physical therapy and assistive devices may also help. This information is not intended to replace advice given to you by your health care provider. Make sure you discuss any questions you have with your health care provider. Document Revised: 06/07/2017 Document Reviewed: 09/03/2016 Elsevier Patient Education  2020 Elsevier Inc.  

## 2019-11-04 NOTE — Telephone Encounter (Signed)
Follow up  Lexine Baton called back, she said Dr. Marcello Moores can wait until pt see Dr. Irish Lack on 11/17/19.

## 2019-11-04 NOTE — Telephone Encounter (Signed)
   Primary Cardiologist: Larae Grooms, MD  Chart reviewed as part of pre-operative protocol coverage. Request received for cervical fusion regarding Dorita Fray. Last office visit with Dr. Irish Lack was 07/2017. Recently hospitalized 10/16/19 with CHF.Marland Kitchen Also underwent preop evaluation for TURP. Lexiscan myoview was positive for anterior ischemia but given his stage IV renal disease Dr. Oval Linsey felt the risks of LHC out weighed the benefits. It was recommended for medical management. Noted RCRI class IV 11% risk for major cardiac event, therefore considered high risk.  Did undergo TURP and outpatient follow up with Dr. Irish Lack has been arranged for 11/17/19.   Callback can you clarify urgency of procedure? Will need to be seen back in follow up with Dr. Irish Lack regarding clearance for cervical fusion regardless but If surgery is urgent, will need to move up appt date.    Reino Bellis, NP 11/04/2019, 11:47 AM

## 2019-11-04 NOTE — Telephone Encounter (Signed)
I have left a message with surgery scheduler Dennis Macias please confirm if surgeon feels this surgery is URGENT or is the surgeon ok with waiting for the pt to see his cardiologist Dennis Macias 11/17/19 for pre op clearance at that time. Pt has not been seen in our office since 07/2017. Ihave left message to please call back and let us know if this is indeed URGENT or ok to wait for appt 11/17/19.

## 2019-11-05 ENCOUNTER — Encounter: Payer: Self-pay | Admitting: Internal Medicine

## 2019-11-05 ENCOUNTER — Telehealth: Payer: Self-pay | Admitting: *Deleted

## 2019-11-05 ENCOUNTER — Ambulatory Visit: Payer: Medicare PPO | Admitting: Internal Medicine

## 2019-11-05 VITALS — BP 168/79 | HR 66 | Temp 99.2°F | Ht 72.0 in | Wt 161.5 lb

## 2019-11-05 DIAGNOSIS — D510 Vitamin B12 deficiency anemia due to intrinsic factor deficiency: Secondary | ICD-10-CM | POA: Diagnosis not present

## 2019-11-05 DIAGNOSIS — I10 Essential (primary) hypertension: Secondary | ICD-10-CM

## 2019-11-05 DIAGNOSIS — Z466 Encounter for fitting and adjustment of urinary device: Secondary | ICD-10-CM | POA: Diagnosis not present

## 2019-11-05 DIAGNOSIS — N184 Chronic kidney disease, stage 4 (severe): Secondary | ICD-10-CM | POA: Diagnosis not present

## 2019-11-05 DIAGNOSIS — I69354 Hemiplegia and hemiparesis following cerebral infarction affecting left non-dominant side: Secondary | ICD-10-CM | POA: Diagnosis not present

## 2019-11-05 DIAGNOSIS — I5041 Acute combined systolic (congestive) and diastolic (congestive) heart failure: Secondary | ICD-10-CM | POA: Diagnosis not present

## 2019-11-05 DIAGNOSIS — E1122 Type 2 diabetes mellitus with diabetic chronic kidney disease: Secondary | ICD-10-CM | POA: Diagnosis not present

## 2019-11-05 DIAGNOSIS — I255 Ischemic cardiomyopathy: Secondary | ICD-10-CM | POA: Diagnosis not present

## 2019-11-05 DIAGNOSIS — N049 Nephrotic syndrome with unspecified morphologic changes: Secondary | ICD-10-CM | POA: Diagnosis not present

## 2019-11-05 DIAGNOSIS — I130 Hypertensive heart and chronic kidney disease with heart failure and stage 1 through stage 4 chronic kidney disease, or unspecified chronic kidney disease: Secondary | ICD-10-CM | POA: Diagnosis not present

## 2019-11-05 MED ORDER — HYDRALAZINE HCL 25 MG PO TABS: 75.0000 mg | ORAL_TABLET | Freq: Three times a day (TID) | ORAL | 0 refills | Status: DC

## 2019-11-05 MED ORDER — ISOSORBIDE DINITRATE 20 MG PO TABS: 40.0000 mg | ORAL_TABLET | Freq: Three times a day (TID) | ORAL | 1 refills | Status: DC

## 2019-11-05 MED ORDER — HYDRALAZINE HCL 25 MG PO TABS: 75.0000 mg | ORAL_TABLET | Freq: Three times a day (TID) | ORAL | 1 refills | Status: DC

## 2019-11-05 MED FILL — ISOSORBIDE DN 20 MG TABLET: 20 | 15 days supply | Qty: 90 | Fill #0

## 2019-11-05 MED FILL — hydrALAZINE HCL 25 MG TABS: 25 | 30 days supply | Qty: 270 | Fill #0

## 2019-11-05 NOTE — Patient Instructions (Addendum)
We will increase your hydralazine to 75mg  three times daily and increased your isosorbide to 40mg  twice daily.  Please be sure to follow up with your cardiologist and see your primary doctor. I have provided some further instructions below to help with your heart failure.    One of your heart tests showed weakness of the heart muscle this admission. This may make you more susceptible to weight gain from fluid retention, which can lead to symptoms that we call heart failure. Please follow these special instructions:  1. Follow a low-salt diet - you are allowed no more than 2,000mg  of sodium per day. Watch your fluid intake. In general, you should not be taking in more than 2 liters of fluid per day (no more than 8 glasses per day). This includes sources of water in foods like soup, coffee, tea, milk, etc. 2. Weigh yourself on the same scale at same time of day and keep a log. 3. Call your doctor: (Anytime you feel any of the following symptoms)  - 3lb weight gain overnight or 5lb within a few days - Shortness of breath, with or without a dry hacking cough  - Swelling in the hands, feet or stomach  - If you have to sleep on extra pillows at night in order to breathe   IT IS IMPORTANT TO LET YOUR DOCTOR KNOW EARLY ON IF YOU ARE HAVING SYMPTOMS SO WE CAN HELP YOU!

## 2019-11-05 NOTE — Telephone Encounter (Signed)
Thank you, agree with appointment

## 2019-11-05 NOTE — Telephone Encounter (Signed)
HHN calls and reports she has seen pt's BP trending up, today at visit 188/91 then 15 mins after rest 190/90 HR remains at 61. Pt denies pain, short of breath, chest pain, dizziness, weakness, change in strength, visual or speech. Pt alarmed and wife more so.  [place] 4/29 at 1545

## 2019-11-05 NOTE — Progress Notes (Signed)
CC: combined systolic and diastolic chf, HTN  HPI:  [name] is a 69 y.o. male with PMH below.  Today we will address combined systolic and diastolic chf, HTN  Please see A&P for status of the patient's chronic medical conditions  Past Medical History:  Diagnosis Date  . Anemia   . Arthritis    past hx   . Blindness    right eye  . Cataract    removed both eyes  . Dehydration   . Diabetes ([place])   . Glaucoma   . History of CVA (cerebrovascular accident) 09/13/2015  . History of urinary retention   . Hyperlipidemia   . Hypertension   . Pernicious anemia 02/24/2018  . Stroke [facility])    2017- March  . Tachycardia 08/26/2017  . Tubular adenoma of colon 02/2017  . Weight loss, non-intentional 08/26/2017   10 lbs between 6/18 & 2/19   Review of Systems:  ROS: Pulmonary: pt denies increased work of breathing, shortness of breath,  Cardiac: pt denies palpitations, chest pain,  Abdominal: pt denies abdominal pain, nausea, vomiting, or diarrhea   Physical Exam:  Vitals:   11/05/19 1534  BP: (!) 168/79  Pulse: 66  Temp: 99.2 F (37.3 C)  TempSrc: Oral  Weight: 161 lb 8 oz (73.3 kg)  Height: 6' (1.829 m)   Cardiac: JVD flat, normal rate and rhythm, clear s1 and s2, no murmurs, rubs or gallops, trace LE edema bilaterally Pulmonary: CTAB, not in distress Abdominal: non distended abdomen, soft and nontender Psych: Alert, conversant, in good spirits   Social History   Socioeconomic History  . Marital status: Married    Spouse name: Not on file  . Number of children: Not on file  . Years of education: Not on file  . Highest education level: Not on file  Occupational History  . Not on file  Tobacco Use  . Smoking status: Former Research scientist (life sciences)  . Smokeless tobacco: Former Systems developer    Types: Chew    Quit date: 07/09/1978  . Tobacco comment: quit 1 year ago  Substance and Sexual Activity  . Alcohol use: No    Alcohol/week: 1.0 standard drinks    Types: 1 Cans of beer per week     Comment: quit last march/2017  . Drug use: No  . Sexual activity: Not on file  Other Topics Concern  . Not on file  Social History Narrative  . Not on file   Social Determinants of Health   Financial Resource Strain:   . Difficulty of Paying Living Expenses:   Food Insecurity:   . Worried About Charity fundraiser in the Last Year:   . Arboriculturist in the Last Year:   Transportation Needs:   . Film/video editor (Medical):   Marland Kitchen Lack of Transportation (Non-Medical):   Physical Activity:   . Days of Exercise per Week:   . Minutes of Exercise per Session:   Stress:   . Feeling of Stress :   Social Connections:   . Frequency of Communication with Friends and Family:   . Frequency of Social Gatherings with Friends and Family:   . Attends Religious Services:   . Active Member of Clubs or Organizations:   . Attends Archivist Meetings:   Marland Kitchen Marital Status:   Intimate Partner Violence:   . Fear of Current or Ex-Partner:   . Emotionally Abused:   Marland Kitchen Physically Abused:   . Sexually Abused:  Family History  Problem Relation Age of Onset  . Hypertension Mother   . Hyperlipidemia Mother   . Hyperlipidemia Father   . Colon cancer Neg Hx   . Colon polyps Neg Hx   . Esophageal cancer Neg Hx   . Rectal cancer Neg Hx   . Stomach cancer Neg Hx     Assessment & Plan:   See Encounters Tab for problem based charting.  Patient discussed with Dr. Philipp Ovens

## 2019-11-06 ENCOUNTER — Other Ambulatory Visit: Payer: Self-pay

## 2019-11-06 ENCOUNTER — Inpatient Hospital Stay (HOSPITAL_COMMUNITY)
Admission: EM | Admit: 2019-11-06 | Discharge: 2019-11-25 | DRG: 280 | Disposition: A | Discharge status: Rehabilitation Facility | Payer: Medicare PPO | Attending: Internal Medicine | Admitting: Internal Medicine

## 2019-11-06 DIAGNOSIS — I255 Ischemic cardiomyopathy: Secondary | ICD-10-CM | POA: Diagnosis present

## 2019-11-06 DIAGNOSIS — R4701 Aphasia: Secondary | ICD-10-CM | POA: Diagnosis not present

## 2019-11-06 DIAGNOSIS — I63512 Cerebral infarction due to unspecified occlusion or stenosis of left middle cerebral artery: Secondary | ICD-10-CM | POA: Diagnosis not present

## 2019-11-06 DIAGNOSIS — N049 Nephrotic syndrome with unspecified morphologic changes: Secondary | ICD-10-CM | POA: Diagnosis present

## 2019-11-06 DIAGNOSIS — R41844 Frontal lobe and executive function deficit: Secondary | ICD-10-CM | POA: Diagnosis not present

## 2019-11-06 DIAGNOSIS — R5381 Other malaise: Secondary | ICD-10-CM | POA: Diagnosis not present

## 2019-11-06 DIAGNOSIS — I2510 Atherosclerotic heart disease of native coronary artery without angina pectoris: Secondary | ICD-10-CM | POA: Diagnosis not present

## 2019-11-06 DIAGNOSIS — G92 Toxic encephalopathy: Secondary | ICD-10-CM | POA: Diagnosis not present

## 2019-11-06 DIAGNOSIS — R778 Other specified abnormalities of plasma proteins: Secondary | ICD-10-CM | POA: Diagnosis not present

## 2019-11-06 DIAGNOSIS — I5043 Acute on chronic combined systolic (congestive) and diastolic (congestive) heart failure: Secondary | ICD-10-CM | POA: Diagnosis present

## 2019-11-06 DIAGNOSIS — Z03818 Encounter for observation for suspected exposure to other biological agents ruled out: Secondary | ICD-10-CM | POA: Diagnosis not present

## 2019-11-06 DIAGNOSIS — E611 Iron deficiency: Secondary | ICD-10-CM | POA: Diagnosis present

## 2019-11-06 DIAGNOSIS — Z87891 Personal history of nicotine dependence: Secondary | ICD-10-CM

## 2019-11-06 DIAGNOSIS — B965 Pseudomonas (aeruginosa) (mallei) (pseudomallei) as the cause of diseases classified elsewhere: Secondary | ICD-10-CM | POA: Diagnosis present

## 2019-11-06 DIAGNOSIS — T361X5A Adverse effect of cephalosporins and other beta-lactam antibiotics, initial encounter: Secondary | ICD-10-CM | POA: Diagnosis not present

## 2019-11-06 DIAGNOSIS — N184 Chronic kidney disease, stage 4 (severe): Secondary | ICD-10-CM | POA: Diagnosis not present

## 2019-11-06 DIAGNOSIS — I080 Rheumatic disorders of both mitral and aortic valves: Secondary | ICD-10-CM | POA: Diagnosis present

## 2019-11-06 DIAGNOSIS — I10 Essential (primary) hypertension: Secondary | ICD-10-CM | POA: Diagnosis not present

## 2019-11-06 DIAGNOSIS — N400 Enlarged prostate without lower urinary tract symptoms: Secondary | ICD-10-CM | POA: Diagnosis present

## 2019-11-06 DIAGNOSIS — R0902 Hypoxemia: Secondary | ICD-10-CM | POA: Diagnosis not present

## 2019-11-06 DIAGNOSIS — Z8249 Family history of ischemic heart disease and other diseases of the circulatory system: Secondary | ICD-10-CM

## 2019-11-06 DIAGNOSIS — E785 Hyperlipidemia, unspecified: Secondary | ICD-10-CM | POA: Diagnosis present

## 2019-11-06 DIAGNOSIS — I132 Hypertensive heart and chronic kidney disease with heart failure and with stage 5 chronic kidney disease, or end stage renal disease: Secondary | ICD-10-CM | POA: Diagnosis present

## 2019-11-06 DIAGNOSIS — Z9079 Acquired absence of other genital organ(s): Secondary | ICD-10-CM

## 2019-11-06 DIAGNOSIS — R7881 Bacteremia: Secondary | ICD-10-CM | POA: Diagnosis not present

## 2019-11-06 DIAGNOSIS — H5461 Unqualified visual loss, right eye, normal vision left eye: Secondary | ICD-10-CM | POA: Diagnosis present

## 2019-11-06 DIAGNOSIS — I5041 Acute combined systolic (congestive) and diastolic (congestive) heart failure: Secondary | ICD-10-CM | POA: Diagnosis not present

## 2019-11-06 DIAGNOSIS — I452 Bifascicular block: Secondary | ICD-10-CM | POA: Diagnosis present

## 2019-11-06 DIAGNOSIS — G9340 Encephalopathy, unspecified: Secondary | ICD-10-CM

## 2019-11-06 DIAGNOSIS — Z20822 Contact with and (suspected) exposure to covid-19: Secondary | ICD-10-CM | POA: Diagnosis present

## 2019-11-06 DIAGNOSIS — I472 Ventricular tachycardia: Secondary | ICD-10-CM | POA: Diagnosis not present

## 2019-11-06 DIAGNOSIS — Z8673 Personal history of transient ischemic attack (TIA), and cerebral infarction without residual deficits: Secondary | ICD-10-CM | POA: Diagnosis not present

## 2019-11-06 DIAGNOSIS — R29810 Facial weakness: Secondary | ICD-10-CM | POA: Diagnosis not present

## 2019-11-06 DIAGNOSIS — Z466 Encounter for fitting and adjustment of urinary device: Secondary | ICD-10-CM | POA: Diagnosis not present

## 2019-11-06 DIAGNOSIS — Z681 Body mass index (BMI) 19 or less, adult: Secondary | ICD-10-CM

## 2019-11-06 DIAGNOSIS — I429 Cardiomyopathy, unspecified: Secondary | ICD-10-CM | POA: Diagnosis not present

## 2019-11-06 DIAGNOSIS — H409 Unspecified glaucoma: Secondary | ICD-10-CM | POA: Diagnosis present

## 2019-11-06 DIAGNOSIS — E43 Unspecified severe protein-calorie malnutrition: Secondary | ICD-10-CM | POA: Diagnosis not present

## 2019-11-06 DIAGNOSIS — I160 Hypertensive urgency: Secondary | ICD-10-CM | POA: Diagnosis present

## 2019-11-06 DIAGNOSIS — R278 Other lack of coordination: Secondary | ICD-10-CM | POA: Diagnosis not present

## 2019-11-06 DIAGNOSIS — N179 Acute kidney failure, unspecified: Secondary | ICD-10-CM | POA: Diagnosis present

## 2019-11-06 DIAGNOSIS — I214 Non-ST elevation (NSTEMI) myocardial infarction: Principal | ICD-10-CM | POA: Diagnosis present

## 2019-11-06 DIAGNOSIS — I700 Atherosclerosis of aorta: Secondary | ICD-10-CM | POA: Diagnosis present

## 2019-11-06 DIAGNOSIS — I63232 Cerebral infarction due to unspecified occlusion or stenosis of left carotid arteries: Secondary | ICD-10-CM | POA: Diagnosis not present

## 2019-11-06 DIAGNOSIS — I69354 Hemiplegia and hemiparesis following cerebral infarction affecting left non-dominant side: Secondary | ICD-10-CM | POA: Diagnosis not present

## 2019-11-06 DIAGNOSIS — R4182 Altered mental status, unspecified: Secondary | ICD-10-CM | POA: Diagnosis not present

## 2019-11-06 DIAGNOSIS — J90 Pleural effusion, not elsewhere classified: Secondary | ICD-10-CM | POA: Diagnosis not present

## 2019-11-06 DIAGNOSIS — I639 Cerebral infarction, unspecified: Secondary | ICD-10-CM | POA: Diagnosis not present

## 2019-11-06 DIAGNOSIS — E872 Acidosis: Secondary | ICD-10-CM | POA: Diagnosis present

## 2019-11-06 DIAGNOSIS — R269 Unspecified abnormalities of gait and mobility: Secondary | ICD-10-CM | POA: Diagnosis not present

## 2019-11-06 DIAGNOSIS — Z7982 Long term (current) use of aspirin: Secondary | ICD-10-CM

## 2019-11-06 DIAGNOSIS — I5021 Acute systolic (congestive) heart failure: Secondary | ICD-10-CM | POA: Diagnosis not present

## 2019-11-06 DIAGNOSIS — R29818 Other symptoms and signs involving the nervous system: Secondary | ICD-10-CM | POA: Diagnosis not present

## 2019-11-06 DIAGNOSIS — I5023 Acute on chronic systolic (congestive) heart failure: Secondary | ICD-10-CM | POA: Diagnosis not present

## 2019-11-06 DIAGNOSIS — R532 Functional quadriplegia: Secondary | ICD-10-CM | POA: Diagnosis not present

## 2019-11-06 DIAGNOSIS — R55 Syncope and collapse: Secondary | ICD-10-CM | POA: Diagnosis not present

## 2019-11-06 DIAGNOSIS — N390 Urinary tract infection, site not specified: Secondary | ICD-10-CM | POA: Diagnosis present

## 2019-11-06 DIAGNOSIS — D510 Vitamin B12 deficiency anemia due to intrinsic factor deficiency: Secondary | ICD-10-CM | POA: Diagnosis not present

## 2019-11-06 DIAGNOSIS — R011 Cardiac murmur, unspecified: Secondary | ICD-10-CM | POA: Diagnosis present

## 2019-11-06 DIAGNOSIS — I5042 Chronic combined systolic (congestive) and diastolic (congestive) heart failure: Secondary | ICD-10-CM

## 2019-11-06 DIAGNOSIS — D631 Anemia in chronic kidney disease: Secondary | ICD-10-CM | POA: Diagnosis present

## 2019-11-06 DIAGNOSIS — N2581 Secondary hyperparathyroidism of renal origin: Secondary | ICD-10-CM | POA: Diagnosis not present

## 2019-11-06 DIAGNOSIS — N185 Chronic kidney disease, stage 5: Secondary | ICD-10-CM | POA: Diagnosis not present

## 2019-11-06 DIAGNOSIS — I130 Hypertensive heart and chronic kidney disease with heart failure and stage 1 through stage 4 chronic kidney disease, or unspecified chronic kidney disease: Secondary | ICD-10-CM | POA: Diagnosis not present

## 2019-11-06 DIAGNOSIS — R4020 Unspecified coma: Secondary | ICD-10-CM | POA: Diagnosis not present

## 2019-11-06 DIAGNOSIS — E1151 Type 2 diabetes mellitus with diabetic peripheral angiopathy without gangrene: Secondary | ICD-10-CM | POA: Diagnosis present

## 2019-11-06 DIAGNOSIS — E1122 Type 2 diabetes mellitus with diabetic chronic kidney disease: Secondary | ICD-10-CM | POA: Diagnosis present

## 2019-11-06 DIAGNOSIS — D638 Anemia in other chronic diseases classified elsewhere: Secondary | ICD-10-CM | POA: Diagnosis not present

## 2019-11-06 DIAGNOSIS — Z79899 Other long term (current) drug therapy: Secondary | ICD-10-CM

## 2019-11-06 DIAGNOSIS — B9561 Methicillin susceptible Staphylococcus aureus infection as the cause of diseases classified elsewhere: Secondary | ICD-10-CM

## 2019-11-06 DIAGNOSIS — N401 Enlarged prostate with lower urinary tract symptoms: Secondary | ICD-10-CM | POA: Diagnosis not present

## 2019-11-06 DIAGNOSIS — Z83438 Family history of other disorder of lipoprotein metabolism and other lipidemia: Secondary | ICD-10-CM

## 2019-11-06 DIAGNOSIS — E1129 Type 2 diabetes mellitus with other diabetic kidney complication: Secondary | ICD-10-CM | POA: Diagnosis not present

## 2019-11-06 DIAGNOSIS — M1990 Unspecified osteoarthritis, unspecified site: Secondary | ICD-10-CM | POA: Diagnosis present

## 2019-11-06 LAB — RESPIRATORY PANEL BY RT PCR (FLU A&B, COVID)
Influenza A by PCR: NEGATIVE
Influenza B by PCR: NEGATIVE
SARS Coronavirus 2 by RT PCR: NEGATIVE

## 2019-11-06 LAB — CBC WITH DIFFERENTIAL/PLATELET
Abs Immature Granulocytes: 0.05 10*3/uL (ref 0.00–0.07)
Basophils Absolute: 0 10*3/uL (ref 0.0–0.1)
Basophils Relative: 0 %
Eosinophils Absolute: 0 10*3/uL (ref 0.0–0.5)
Eosinophils Relative: 0 %
HCT: 25.7 % — ABNORMAL LOW (ref 39.0–52.0)
Hemoglobin: 8.5 g/dL — ABNORMAL LOW (ref 13.0–17.0)
Immature Granulocytes: 0 %
Lymphocytes Relative: 8 %
Lymphs Abs: 1.1 10*3/uL (ref 0.7–4.0)
MCH: 30.2 pg (ref 26.0–34.0)
MCHC: 33.1 g/dL (ref 30.0–36.0)
MCV: 91.5 fL (ref 80.0–100.0)
Monocytes Absolute: 0.6 10*3/uL (ref 0.1–1.0)
Monocytes Relative: 5 %
Neutro Abs: 11.5 10*3/uL — ABNORMAL HIGH (ref 1.7–7.7)
Neutrophils Relative %: 87 %
Platelets: 214 10*3/uL (ref 150–400)
RBC: 2.81 MIL/uL — ABNORMAL LOW (ref 4.22–5.81)
RDW: 13.4 % (ref 11.5–15.5)
WBC: 13.2 10*3/uL — ABNORMAL HIGH (ref 4.0–10.5)
nRBC: 0 % (ref 0.0–0.2)

## 2019-11-06 LAB — TROPONIN I (HIGH SENSITIVITY)
Troponin I (High Sensitivity): 1618 ng/L (ref <18)
Troponin I (High Sensitivity): 2384 ng/L (ref <18)
Troponin I (High Sensitivity): 2233 ng/L (ref <18)

## 2019-11-06 LAB — BASIC METABOLIC PANEL
Anion gap: 11 (ref 5–15)
BUN: 55 mg/dL — ABNORMAL HIGH (ref 8–23)
CO2: 20 mmol/L — ABNORMAL LOW (ref 22–32)
Calcium: 8.8 mg/dL — ABNORMAL LOW (ref 8.9–10.3)
Chloride: 106 mmol/L (ref 98–111)
Creatinine, Ser: 3.84 mg/dL — ABNORMAL HIGH (ref 0.61–1.24)
GFR calc Af Amer: 18 mL/min — ABNORMAL LOW (ref >60)
GFR calc non Af Amer: 15 mL/min — ABNORMAL LOW (ref >60)
Glucose, Bld: 173 mg/dL — ABNORMAL HIGH (ref 70–99)
Potassium: 4.3 mmol/L (ref 3.5–5.1)
Sodium: 137 mmol/L (ref 135–145)

## 2019-11-06 LAB — CBG MONITORING, ED: Glucose-Capillary: 175 mg/dL — ABNORMAL HIGH (ref 70–99)

## 2019-11-06 LAB — MAGNESIUM: Magnesium: 1.9 mg/dL (ref 1.7–2.4)

## 2019-11-06 LAB — BRAIN NATRIURETIC PEPTIDE: B Natriuretic Peptide: 4075.9 pg/mL — ABNORMAL HIGH (ref 0.0–100.0)

## 2019-11-06 LAB — GLUCOSE, CAPILLARY: Glucose-Capillary: 117 mg/dL — ABNORMAL HIGH (ref 70–99)

## 2019-11-06 MED ORDER — DORZOLAMIDE HCL-TIMOLOL MAL 2-0.5 % OP SOLN
1.0000 [drp] | Freq: Two times a day (BID) | OPHTHALMIC | Status: DC
  Administered 2019-11-07 – 2019-11-25 (×37): 1 [drp] via OPHTHALMIC
  Filled 2019-11-06: qty 10

## 2019-11-06 MED ORDER — TAMSULOSIN HCL 0.4 MG PO CAPS
0.4000 mg | ORAL_CAPSULE | Freq: Every day | ORAL | Status: DC
  Administered 2019-11-06 – 2019-11-25 (×19): 0.4 mg via ORAL
  Filled 2019-11-06 (×19): qty 1

## 2019-11-06 MED ORDER — ASPIRIN EC 81 MG PO TBEC
81.0000 mg | DELAYED_RELEASE_TABLET | Freq: Every day | ORAL | Status: DC
  Administered 2019-11-07 – 2019-11-16 (×8): 81 mg via ORAL
  Filled 2019-11-06 (×8): qty 1

## 2019-11-06 MED ORDER — FUROSEMIDE 80 MG PO TABS
80.0000 mg | ORAL_TABLET | Freq: Every day | ORAL | Status: DC
  Administered 2019-11-07 – 2019-11-08 (×2): 80 mg via ORAL
  Filled 2019-11-06 (×2): qty 1

## 2019-11-06 MED ORDER — ACETAMINOPHEN 650 MG RE SUPP: 650.0000 mg | Freq: Four times a day (QID) | RECTAL | Status: DC | PRN

## 2019-11-06 MED ORDER — METOPROLOL SUCCINATE ER 50 MG PO TB24
50.0000 mg | ORAL_TABLET | Freq: Every day | ORAL | Status: DC
  Administered 2019-11-07 – 2019-11-08 (×2): 50 mg via ORAL
  Filled 2019-11-06 (×3): qty 1

## 2019-11-06 MED ORDER — ACETAMINOPHEN 325 MG PO TABS
650.0000 mg | ORAL_TABLET | Freq: Four times a day (QID) | ORAL | Status: DC | PRN
  Administered 2019-11-07 – 2019-11-25 (×6): 650 mg via ORAL
  Filled 2019-11-06 (×5): qty 2

## 2019-11-06 MED ORDER — SODIUM BICARBONATE 650 MG PO TABS
650.0000 mg | ORAL_TABLET | Freq: Two times a day (BID) | ORAL | Status: DC
  Administered 2019-11-06 – 2019-11-10 (×9): 650 mg via ORAL
  Filled 2019-11-06 (×10): qty 1

## 2019-11-06 MED ORDER — ISOSORBIDE DINITRATE 10 MG PO TABS
40.0000 mg | ORAL_TABLET | Freq: Three times a day (TID) | ORAL | Status: DC
  Administered 2019-11-06 – 2019-11-13 (×19): 40 mg via ORAL
  Filled 2019-11-06 (×9): qty 4
  Filled 2019-11-06: qty 2
  Filled 2019-11-06: qty 4
  Filled 2019-11-06: qty 2
  Filled 2019-11-06 (×8): qty 4

## 2019-11-06 MED ORDER — HEPARIN SODIUM (PORCINE) 5000 UNIT/ML IJ SOLN
5000.0000 [IU] | Freq: Three times a day (TID) | INTRAMUSCULAR | Status: DC
  Administered 2019-11-06 – 2019-11-25 (×49): 5000 [IU] via SUBCUTANEOUS
  Filled 2019-11-06 (×51): qty 1

## 2019-11-06 MED ORDER — HYDRALAZINE HCL 50 MG PO TABS
75.0000 mg | ORAL_TABLET | Freq: Three times a day (TID) | ORAL | Status: DC
  Administered 2019-11-06 – 2019-11-07 (×5): 75 mg via ORAL
  Filled 2019-11-06 (×2): qty 1
  Filled 2019-11-06: qty 3
  Filled 2019-11-06 (×3): qty 1

## 2019-11-06 MED ORDER — VITAMIN B-12 1000 MCG PO TABS
1000.0000 ug | ORAL_TABLET | Freq: Every day | ORAL | Status: DC
  Administered 2019-11-07 – 2019-11-25 (×17): 1000 ug via ORAL
  Filled 2019-11-06 (×17): qty 1

## 2019-11-06 MED ORDER — FINASTERIDE 5 MG PO TABS
5.0000 mg | ORAL_TABLET | Freq: Every day | ORAL | Status: DC
  Administered 2019-11-07 – 2019-11-25 (×17): 5 mg via ORAL
  Filled 2019-11-06 (×17): qty 1

## 2019-11-06 MED ORDER — INSULIN ASPART 100 UNIT/ML ~~LOC~~ SOLN
0.0000 [IU] | Freq: Three times a day (TID) | SUBCUTANEOUS | Status: DC
  Administered 2019-11-08: 1 [IU] via SUBCUTANEOUS

## 2019-11-06 MED ORDER — ASPIRIN 81 MG PO CHEW
324.0000 mg | CHEWABLE_TABLET | Freq: Once | ORAL | Status: AC
  Administered 2019-11-06: 324 mg via ORAL
  Filled 2019-11-06: qty 4

## 2019-11-06 MED ORDER — ATORVASTATIN CALCIUM 80 MG PO TABS
80.0000 mg | ORAL_TABLET | Freq: Every day | ORAL | Status: DC
  Administered 2019-11-06 – 2019-11-24 (×18): 80 mg via ORAL
  Filled 2019-11-06 (×18): qty 1

## 2019-11-06 MED ORDER — FERROUS GLUCONATE 324 (38 FE) MG PO TABS
324.0000 mg | ORAL_TABLET | Freq: Every day | ORAL | Status: DC
  Administered 2019-11-07 – 2019-11-25 (×17): 324 mg via ORAL
  Filled 2019-11-06 (×20): qty 1

## 2019-11-06 NOTE — ED Notes (Signed)
This RN asked this provider if they would like to place an order for Foley Catheter or if they would like this RN to discontinue the foley catheter. Awaiting further orders if any.

## 2019-11-06 NOTE — ED Notes (Signed)
Cardiology at Bedside

## 2019-11-06 NOTE — ED Notes (Signed)
This RN attempted to call report for this pt; RN currently unavailable at this time.

## 2019-11-06 NOTE — Progress Notes (Signed)
Subjective: Dennis Macias presents today for follow up of at risk foot care. Pt has h/o NIDDM with chronic kidney disease and callus(es) b/l and painful mycotic toenails b/l that are difficult to trim. Pain interferes with ambulation. Aggravating factors include wearing enclosed shoe gear. Pain is relieved with periodic professional debridement.   He voices no new pedal problems on today's visit.   No Known Allergies   Objective: Vitals:   11/02/19 1541  Temp: 97.9 F (36.6 C)    Pt is a pleasant 69 y.o. year old AA male  in NAD. AAO x 3.   Vascular Examination:  Capillary fill time to digits <3 seconds b/l. Faintly palpable pedal pulses b/l. Pedal hair absent b/l Skin temperature gradient within normal limits b/l.  Dermatological Examination: Pedal skin with normal turgor, texture and tone bilaterally. No open wounds bilaterally. No interdigital macerations bilaterally. Toenails 1-5 b/l elongated, dystrophic, thickened, crumbly with subungual debris and tenderness to dorsal palpation. Hyperkeratotic lesion(s) submet head 5 left foot and submet head 5 right foot.  No erythema, no edema, no drainage, no flocculence.  Musculoskeletal: Normal muscle strength 5/5 to all lower extremity muscle groups bilaterally. No gross bony deformities bilaterally. No pain crepitus or joint limitation noted with ROM b/l.  Neurological: Protective sensation diminished with 10g monofilament b/l. Vibratory sensation diminished b/l.  Assessment: 1. Pain due to onychomycosis of toenails of both feet   2. Callus   3. Diabetic peripheral neuropathy associated with type 2 diabetes mellitus (Dennis Macias)    Plan: -Continue diabetic foot care principles. Literature dispensed on today.  -Toenails 1-5 b/l were debrided in length and girth with sterile nail nippers and dremel without iatrogenic bleeding.  -Callus(es) submet head 5 left foot and submet head 5 right foot pared utilizing sterile scalpel blade without  complication or incident. Total number debrided =2. -Patient to continue soft, supportive shoe gear daily. -Patient to report any pedal injuries to medical professional immediately. -Patient/POA to call should there be question/concern in the interim.  Return in about 3 months (around 02/01/2020) for diabetic nail and callus trim.

## 2019-11-06 NOTE — ED Provider Notes (Signed)
[facility] EMERGENCY DEPARTMENT Provider Note   CSN: [id number] Arrival date & time: 11/06/19  0177     History Chief Complaint  Patient presents with  . Loss of Consciousness    Dennis Macias is a 69 y.o. male.  69 yo M with a chief complaints of a syncopal event.  Patient states that his blood pressure medication was recently increased and he took it for the first time this morning.  He sat down to eat breakfast and suddenly felt unwell.  He is unsure exactly what happened.  Denies standing up.  Does not think he totally blacked out.  Denies chest pain or shortness of breath.  Denied headache or neck pain.  Prior to this event he denies feeling unwell.  Denies cough or fever denies vomiting or diarrhea denies dark stool or blood in his stool.  Denies abdominal pain.  Denies any other recent medication changes.  States now he feels well.  The history is provided by the patient.  Loss of Consciousness Episode history:  Single Most recent episode:  Today Duration:  5 minutes Timing:  Rare Progression:  Resolved Chronicity:  New Context comment:  Eating breakfast Witnessed: yes   Relieved by:  Lying down Worsened by:  Nothing Ineffective treatments:  None tried Associated symptoms: no chest pain, no confusion, no fever, no headaches, no palpitations, no shortness of breath and no vomiting        Past Medical History:  Diagnosis Date  . Anemia   . Arthritis    past hx   . Blindness    right eye  . Cataract    removed both eyes  . Dehydration   . Diabetes ([place])   . Glaucoma   . History of CVA (cerebrovascular accident) 09/13/2015  . History of urinary retention   . Hyperlipidemia   . Hypertension   . Pernicious anemia 02/24/2018  . Stroke [facility])    2017- March  . Tachycardia 08/26/2017  . Tubular adenoma of colon 02/2017  . Weight loss, non-intentional 08/26/2017   10 lbs between 6/18 & 2/19    Patient Active Problem List   Diagnosis Date Noted  .  Acute combined systolic and diastolic congestive heart failure ([place]) 10/21/2019  . Ischemic cardiomyopathy: Probable 10/21/2019  . Pleural effusion transudative   . S/P TURP   . Cardiorenal syndrome   . Dyspnea 10/15/2019  . Elevated TSH 09/29/2019  . Pleural effusion 09/21/2019  . Fatigue 09/17/2019  . Hematuria, unspecified 08/13/2019  . Nephrotic syndrome 08/05/2019  . Protein calorie malnutrition ([place]) 07/31/2019  . Left renal mass 07/21/2019  . CKD (chronic kidney disease) stage 4, GFR 15-29 ml/min ([facility]) 04/23/2019  . Hip weakness 08/13/2018  . Pernicious anemia 02/24/2018  . Weight loss, non-intentional 08/26/2017  . 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 BPH with urinary obstruction   . Normocytic anemia 12/21/2016  . 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 Hyperlipidemia 09/13/2015  . Diabetes mellitus with retinopathy of both eyes ([place])   . Essential hypertension     Past Surgical History:  Procedure Laterality Date  . CATARACT EXTRACTION, BILATERAL    . COLONOSCOPY    . IR THORACENTESIS ASP PLEURAL SPACE W/IMG GUIDE  09/21/2019  . IR THORACENTESIS ASP PLEURAL SPACE W/IMG GUIDE  10/16/2019  . POLYPECTOMY    . REFRACTIVE SURGERY  10/2017  . TEE WITHOUT CARDIOVERSION N/A 09/14/2015   Procedure: TRANSESOPHAGEAL ECHOCARDIOGRAM (TEE);  Surgeon: Larey Dresser, MD;  Location:  [facility];  Service: Cardiovascular;  Laterality: N/A;  . TRANSURETHRAL RESECTION OF PROSTATE N/A 10/20/2019   Procedure: TRANSURETHRAL RESECTION OF THE PROSTATE (TURP);  Surgeon: Irine Seal, MD;  Location: [facility];  Service: Urology;  Laterality: N/A;  . UPPER GASTROINTESTINAL ENDOSCOPY         Family History  Problem Relation Age of Onset  . Hypertension Mother   . Hyperlipidemia Mother   . Hyperlipidemia Father   . Colon cancer Neg Hx   . Colon polyps Neg Hx   . Esophageal cancer Neg Hx   . Rectal cancer Neg Hx   . Stomach cancer Neg Hx     Social History    Tobacco Use  . Smoking status: Former Research scientist (life sciences)  . Smokeless tobacco: Former Systems developer    Types: Chew    Quit date: 07/09/1978  . Tobacco comment: quit 1 year ago  Substance Use Topics  . Alcohol use: No    Alcohol/week: 1.0 standard drinks    Types: 1 Cans of beer per week    Comment: quit last march/2017  . Drug use: No    Home Medications Prior to Admission medications   Medication Sig Start Date End Date Taking? Authorizing Provider  aspirin EC 81 MG tablet Take 1 tablet (81 mg total) by mouth daily. 10/22/19  Yes Eugenie Filler, MD  atorvastatin (LIPITOR) 80 MG tablet Take 1 tablet (80 mg total) by mouth at bedtime. IM program 07/22/19  Yes [name], Rylee, MD  dorzolamide-timolol (COSOPT) 22.3-6.8 MG/ML ophthalmic solution Place 1 drop into the right eye 2 (two) times daily.  01/16/18  Yes [provider]  ferrous gluconate (FERGON) 324 MG tablet Take 324 mg by mouth daily with breakfast.   Yes [provider]  finasteride (PROSCAR) 5 MG tablet Take 5 mg by mouth daily.   Yes [provider]  furosemide (LASIX) 80 MG tablet Take 1 tablet (80 mg total) by mouth daily. 10/23/19  Yes Eugenie Filler, MD  hydrALAZINE (APRESOLINE) 25 MG tablet Take 3 tablets (75 mg total) by mouth 3 (three) times daily. 11/05/19  Yes Katherine Roan, MD  isosorbide dinitrate (ISORDIL) 20 MG tablet Take 2 tablets (40 mg total) by mouth 3 (three) times daily. 11/05/19  Yes Katherine Roan, MD  metoprolol succinate (TOPROL-XL) 50 MG 24 hr tablet Take 1 tablet (50 mg total) by mouth daily. Take with or immediately following a meal. 10/23/19  Yes Eugenie Filler, MD  sodium bicarbonate 650 MG tablet Take 650 mg by mouth 2 (two) times daily.   Yes [provider]  tamsulosin (FLOMAX) 0.4 MG CAPS capsule Take 0.4 mg by mouth daily after supper.   Yes [provider]  vitamin B-12 (CYANOCOBALAMIN) 1000 MCG tablet Take 1,000 mcg by mouth daily.   Yes [provider]  ACCU-CHEK FASTCLIX LANCETS MISC Check blood sugar up to 7 times a week as instructed Patient taking differently: 1 each by Other route See admin instructions. Check blood sugar up to 7 times a week as instructed 08/13/18   Isabelle Course, MD  Blood Glucose Monitoring Suppl (ACCU-CHEK GUIDE) w/Device KIT 1 each by Does not apply route daily. Check blood sugar as instructed up to 7 times a week Patient taking differently: 1 each by Does not apply route See admin instructions. Check blood sugar as instructed up to 7 times a week 08/13/18   Isabelle Course, MD  glucose blood (ACCU-CHEK GUIDE) test strip Check blood  sugar up to 7 times a week as instructed Patient taking differently: 1 each by Other route See admin instructions. Check blood sugar up to 7 times a week as instructed 08/13/18   Isabelle Course, MD    Allergies    Patient has no known allergies.  Review of Systems   Review of Systems  Constitutional: Negative for chills and fever.  HENT: Negative for congestion and facial swelling.   Eyes: Negative for discharge and visual disturbance.  Respiratory: Negative for shortness of breath.   Cardiovascular: Positive for syncope. Negative for chest pain and palpitations.  Gastrointestinal: Negative for abdominal pain, diarrhea and vomiting.  Musculoskeletal: Negative for arthralgias and myalgias.  Skin: Negative for color change and rash.  Neurological: Positive for syncope. Negative for tremors and headaches.  Psychiatric/Behavioral: Negative for confusion and dysphoric mood.    Physical Exam Updated Vital Signs BP (!) 171/102   Pulse 61   Temp 98.4 F (36.9 C) (Oral)   Resp 20   Ht 6' (1.829 m)   Wt 72.6 kg   SpO2 98%   BMI 21.70 kg/m   Physical Exam Vitals and nursing note reviewed.  Constitutional:      Appearance: He is well-developed.  HENT:     Head: Normocephalic and atraumatic.  Eyes:     Pupils: Pupils are equal, round, and reactive to light.  Neck:      Vascular: No JVD.  Cardiovascular:     Rate and Rhythm: Normal rate and regular rhythm.     Heart sounds: No murmur. No friction rub. No gallop.   Pulmonary:     Effort: No respiratory distress.     Breath sounds: No wheezing.  Abdominal:     General: There is no distension.     Tenderness: There is no abdominal tenderness. There is no guarding or rebound.  Musculoskeletal:        General: Normal range of motion.     Cervical back: Normal range of motion and neck supple.     Right lower leg: Edema present.     Left lower leg: Edema present.     Comments: 1+ to BLE  Skin:    Coloration: Skin is not pale.     Findings: No rash.  Neurological:     Mental Status: He is alert and oriented to person, place, and time.  Psychiatric:        Behavior: Behavior normal.     ED Results / Procedures / Treatments   Labs (all labs ordered are listed, but only abnormal results are displayed) Labs Reviewed  CBC WITH DIFFERENTIAL/PLATELET - Abnormal; Notable for the following components:      Result Value   WBC 13.2 (*)    RBC 2.81 (*)    Hemoglobin 8.5 (*)    HCT 25.7 (*)    Neutro Abs 11.5 (*)    All other components within normal limits  BASIC METABOLIC PANEL - Abnormal; Notable for the following components:   CO2 20 (*)    Glucose, Bld 173 (*)    BUN 55 (*)    Creatinine, Ser 3.84 (*)    Calcium 8.8 (*)    GFR calc non Af Amer 15 (*)    GFR calc Af Amer 18 (*)    All other components within normal limits  BRAIN NATRIURETIC PEPTIDE - Abnormal; Notable for the following components:   B Natriuretic Peptide 4,075.9 (*)    All other components within normal limits  CBG MONITORING, ED - Abnormal; Notable for the following components:   Glucose-Capillary 175 (*)    All other components within normal limits  TROPONIN I (HIGH SENSITIVITY) - Abnormal; Notable for the following components:   Troponin I (High Sensitivity) 1,618 (*)    All other components within normal limits    RESPIRATORY PANEL BY RT PCR (FLU A&B, COVID)  MAGNESIUM    EKG EKG Interpretation  Date/Time:  Friday November 06 2019 11:25:57 EDT Ventricular Rate:  65 PR Interval:    QRS Duration: 106 QT Interval:  457 QTC Calculation: 476 R Axis:   -77 Text Interpretation: Sinus rhythm Atrial premature complexes Incomplete RBBB and LAFB RSR' in V1 or V2, right VCD or RVH Nonspecific T abnormalities, lateral leads Borderline prolonged QT interval widening and discoordance now resolved Confirmed by Deno Etienne [phone]) on 11/06/2019 11:39:56 AM   Radiology No results found.  Procedures Procedures (including critical care time)  Medications Ordered in ED Medications  aspirin chewable tablet 324 mg (324 mg Oral Given 11/06/19 1128)    ED Course  I have reviewed the triage vital signs and the nursing notes.  Pertinent labs & imaging results that were available during my care of the patient were reviewed by me and considered in my medical decision making (see chart for details).    MDM Rules/Calculators/A&P                      69 yo M with a chief complaints of a syncopal event.  This happened while the patient was eating breakfast.  Was likely vagal versus recent medication change.  Looks like he had a hydralazine increased which could make him transiently hypotensive.  He is not hypertensive here.  His EKG is also changed from recent with QRS widening and new deep inverted T waves in the anterior leads.  He does have a history of heart failure as well.  I feel this makes him high risk for arrhythmia.  Will obtain a work-up here and then likely discuss with internal medicine for admission.  Trop elevated to 1k.  Cards to see.  Recommend medical admission.   CRITICAL CARE Performed by: Cecilio Asper   Total critical care time: 35 minutes  Critical care time was exclusive of separately billable procedures and treating other patients.  Critical care was necessary to treat or prevent  imminent or life-threatening deterioration.  Critical care was time spent personally by me on the following activities: development of treatment plan with patient and/or surrogate as well as nursing, discussions with consultants, evaluation of patient's response to treatment, examination of patient, obtaining history from patient or surrogate, ordering and performing treatments and interventions, ordering and review of laboratory studies, ordering and review of radiographic studies, pulse oximetry and re-evaluation of patient's condition.  The patients results and plan were reviewed and discussed.   Any x-rays performed were independently reviewed by myself.   Differential diagnosis were considered with the presenting HPI.  Medications  aspirin chewable tablet 324 mg (324 mg Oral Given 11/06/19 1128)    Vitals:   11/06/19 1100 11/06/19 1200 11/06/19 1341 11/06/19 1343  BP: (!) 178/84 (!) 168/79 (!) 171/102 (!) 171/102  Pulse: 68 65 61   Resp: (!) _0 Temp:    98.4 F (36.9 C)  TempSrc:    Oral  SpO2: 96% 95% 98%   Weight:      Height:  Final diagnoses:  Syncope and collapse    Admission/ observation were discussed with the admitting physician, patient and/or family and they are comfortable with the plan.    Final Clinical Impression(s) / ED Diagnoses Final diagnoses:  Syncope and collapse    Rx / DC Orders ED Discharge Orders    None       Deno Etienne, DO 11/06/19 1605

## 2019-11-06 NOTE — ED Notes (Signed)
Dennis Macias, daughter would like an update [phone]

## 2019-11-06 NOTE — Assessment & Plan Note (Addendum)
Here for hypertension.  Not volume overloaded on exam today reports they are following diet and fluid restriction, taking lasix.  Below discharge weight today.  Reports breathing has improved. Not a candidate for renally acting CHF meds due to renal insufficiency.    -increase BIdil to 75-40 TID -HR 66, continue metoprolol at current dose 50mg 

## 2019-11-06 NOTE — ED Notes (Signed)
This RN attempted to call report for this pt; Charge RN currently unavailable but will re-attempt again in 5-10 minutes.

## 2019-11-06 NOTE — Assessment & Plan Note (Signed)
BP Readings from Last 3 Encounters:  11/06/19 (!) 165/80  11/05/19 (!) 168/79  10/22/19 (!) 154/76   Remains hypertensive, consistent with bp log pt brings in today.  -Increase Bidil to 75-40 given CHF and renal function

## 2019-11-06 NOTE — ED Notes (Signed)
Dr Tyrone Nine notified of elevated troponin.

## 2019-11-06 NOTE — ED Triage Notes (Signed)
Pt arrival via [facility] stated that pt. Wife stated he had a syncopal episode this morning while eating breakfast that lasted about 10 seconds. Per EMS pt has been A&Ox4. Recently had a increase in his Beta blockers.  EMS VS- 140/74, 97% on RA, CBG 210, T-96.8, P-64. 20g IV to left hand.  Pt states he do not remember passing out.

## 2019-11-06 NOTE — Consult Note (Addendum)
Cardiology Consultation:   Patient ID: Dennis Macias MRN: [id number]; DOB: 04-07-1951  Admit date: 11/06/2019 Date of Consult: 11/06/2019  Primary Care Provider: [name], MD Primary Cardiologist: Larae Grooms, MD  Primary Electrophysiologist:  None    Patient Profile:   Dennis Macias is a 69 y.o. male with a hx of CAD, ICM, HFrEF, HTN, HLD, nephrotic syndrome, CKD stage IV, anemia of chronic disease, BPH with chronic indwelling foley catheter, and hx of stroke in 2017 who is being seen today for the evaluation of syncope at the request of Dr. Tyrone Nine.  History of Present Illness:   Dennis Macias has a history of CAD, ischemic cardiomyopathy, chronic systolic and diastolic heart failure, CKD stage IV, anemia of chronic disease,a nd BPH with chronic indwelling foley catheter s/p TURP 10/20/19.   He was admitted 09/2019 with anasarca and pleural effusions treated with diuresis and right thoracentesis. He was readmitted 4/7-4/15/21 with acute systolic and diastolic heart failure with left pleural effusions. He had an abnormal stress test that showed LV apex infarction with peri-infarct ischemia in the anterior and inferior walls. Echo with EF 40-45%, grade 1 DD, severely dilated LA, moderate pleural effusion, normal IVC size, mild MR. Given his renal function, medical therapy was recommended. He was started on hydralazine 37.5 TID and isordil 20 mg TID. He was discharged on ASA, BB, and statin along with 81 mg lasix daily.   [facility] evaluated the patient yesterday and called PCP with concerns for worsening HTN with systolic BP in the [id number]. He was seen yesterday by PCP and medications adjusted: hydralazine to 75 mg TID and isordil increased to 40 mg TID. He took these medications for the first time this morning. He sat down to breakfast and felt unwell. He is unsure if he had loss of consciousness.  He states "I blacked out." I was unable to reach is girlfriend Dennis Macias for more information. He denies  chest pain, palpitations, and dyspnea prior to this episode.  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 Anemia   . Arthritis    past hx   . Blindness    right eye  . Cataract    removed both eyes  . Dehydration   . Diabetes ([place])   . Glaucoma   . History of CVA (cerebrovascular accident) 09/13/2015  . History of urinary retention   . Hyperlipidemia   . Hypertension   . Pernicious anemia 02/24/2018  . Stroke [facility])    2017- March  . Tachycardia 08/26/2017  . Tubular adenoma of colon 02/2017  . Weight loss, non-intentional 08/26/2017   10 lbs between 6/18 & 2/19    Past Surgical History:  Procedure Laterality Date  . CATARACT EXTRACTION, BILATERAL    . COLONOSCOPY    . IR THORACENTESIS ASP PLEURAL SPACE W/IMG GUIDE  09/21/2019  . IR THORACENTESIS ASP PLEURAL SPACE W/IMG GUIDE  10/16/2019  . POLYPECTOMY    . REFRACTIVE SURGERY  10/2017  . TEE WITHOUT CARDIOVERSION N/A 09/14/2015   Procedure: TRANSESOPHAGEAL ECHOCARDIOGRAM (TEE);  Surgeon: Larey Dresser, MD;  Location: [facility];  Service: Cardiovascular;  Laterality: N/A;  . TRANSURETHRAL RESECTION OF PROSTATE N/A 10/20/2019   Procedure: TRANSURETHRAL RESECTION OF THE PROSTATE (TURP);  Surgeon: Irine Seal, MD;  Location: [facility];  Service: Urology;  Laterality: N/A;  .  UPPER GASTROINTESTINAL ENDOSCOPY       Home Medications:  Prior to Admission medications   Medication Sig Start Date End Date Taking? Authorizing Provider  aspirin EC 81 MG tablet Take 1 tablet (81 mg total) by mouth daily. 10/22/19  Yes Eugenie Filler, MD  atorvastatin (LIPITOR) 80 MG tablet Take 1 tablet (80 mg total) by mouth at bedtime. IM program 07/22/19  Yes [name], Rylee, MD  dorzolamide-timolol (COSOPT) 22.3-6.8 MG/ML ophthalmic solution Place 1 drop into the right eye  2 (two) times daily.  01/16/18  Yes [provider]  ferrous gluconate (FERGON) 324 MG tablet Take 324 mg by mouth daily with breakfast.   Yes [provider]  finasteride (PROSCAR) 5 MG tablet Take 5 mg by mouth daily.   Yes [provider]  furosemide (LASIX) 80 MG tablet Take 1 tablet (80 mg total) by mouth daily. 10/23/19  Yes Eugenie Filler, MD  hydrALAZINE (APRESOLINE) 25 MG tablet Take 3 tablets (75 mg total) by mouth 3 (three) times daily. 11/05/19  Yes Katherine Roan, MD  isosorbide dinitrate (ISORDIL) 20 MG tablet Take 2 tablets (40 mg total) by mouth 3 (three) times daily. 11/05/19  Yes Katherine Roan, MD  metoprolol succinate (TOPROL-XL) 50 MG 24 hr tablet Take 1 tablet (50 mg total) by mouth daily. Take with or immediately following a meal. 10/23/19  Yes Eugenie Filler, MD  sodium bicarbonate 650 MG tablet Take 650 mg by mouth 2 (two) times daily.   Yes [provider]  tamsulosin (FLOMAX) 0.4 MG CAPS capsule Take 0.4 mg by mouth daily after supper.   Yes [provider]  vitamin B-12 (CYANOCOBALAMIN) 1000 MCG tablet Take 1,000 mcg by mouth daily.   Yes [provider]  ACCU-CHEK FASTCLIX LANCETS MISC Check blood sugar up to 7 times a week as instructed Patient taking differently: 1 each by Other route See admin instructions. Check blood sugar up to 7 times a week as instructed 08/13/18   Isabelle Course, MD  Blood Glucose Monitoring Suppl (ACCU-CHEK GUIDE) w/Device KIT 1 each by Does not apply route daily. Check blood sugar as instructed up to 7 times a week Patient taking differently: 1 each by Does not apply route See admin instructions. Check blood sugar as instructed up to 7 times a week 08/13/18   Isabelle Course, MD  glucose blood (ACCU-CHEK GUIDE) test strip Check blood sugar up to 7 times a week as instructed Patient taking differently: 1 each by Other route See admin instructions. Check blood sugar up to 7 times a week  as instructed 08/13/18   Isabelle Course, MD    Inpatient Medications: Scheduled Meds:  Continuous Infusions:  PRN Meds:   Allergies:   No Known Allergies  Social History:   Social History   Socioeconomic History  . Marital status: Married    Spouse name: Not on file  . Number of children: Not on file  . Years of education: Not on file  . Highest education level: Not on file  Occupational History  . Not on file  Tobacco Use  . Smoking status: Former Research scientist (life sciences)  . Smokeless tobacco: Former Systems developer    Types: Chew    Quit date: 07/09/1978  . Tobacco comment: quit 1 year ago  Substance and Sexual Activity  . Alcohol use: No    Alcohol/week: 1.0 standard drinks    Types: 1 Cans of beer per week    Comment: quit last  march/2017  . Drug use: No  . Sexual activity: Not on file  Other Topics Concern  . Not on file  Social History Narrative  . Not on file   Social Determinants of Health   Financial Resource Strain:   . Difficulty of Paying Living Expenses:   Food Insecurity:   . Worried About Charity fundraiser in the Last Year:   . Arboriculturist in the Last Year:   Transportation Needs:   . Film/video editor (Medical):   Marland Kitchen Lack of Transportation (Non-Medical):   Physical Activity:   . Days of Exercise per Week:   . Minutes of Exercise per Session:   Stress:   . Feeling of Stress :   Social Connections:   . Frequency of Communication with Friends and Family:   . Frequency of Social Gatherings with Friends and Family:   . Attends Religious Services:   . Active Member of Clubs or Organizations:   . Attends Archivist Meetings:   Marland Kitchen Marital Status:   Intimate Partner Violence:   . Fear of Current or Ex-Partner:   . Emotionally Abused:   Marland Kitchen Physically Abused:   . Sexually Abused:     Family History:    Family History  Problem Relation Age of Onset  . Hypertension Mother   . Hyperlipidemia Mother   . Hyperlipidemia Father   . Colon cancer Neg Hx   .  Colon polyps Neg Hx   . Esophageal cancer Neg Hx   . Rectal cancer Neg Hx   . Stomach cancer Neg Hx      ROS:  Please see the history of present illness.   All other ROS reviewed and negative.     Physical Exam/Data:   Vitals:   11/06/19 1100 11/06/19 1200 11/06/19 1341 11/06/19 1343  BP: (!) 178/84 (!) 168/79 (!) 171/102 (!) 171/102  Pulse: 68 65 61   Resp: (!) 27 15 20    Temp:    98.4 F (36.9 C)  TempSrc:    Oral  SpO2: 96% 95% 98%   Weight:      Height:       No intake or output data in the 24 hours ending 11/06/19 1454 Last 3 Weights 11/06/2019 11/05/2019 10/22/2019  Weight (lbs) 160 lb 161 lb 8 oz 160 lb 0.9 oz  Weight (kg) 72.576 kg 73.256 kg 72.6 kg     Body mass index is 21.7 kg/m.  General:  Thin elderly male in NAD HEENT: normal Neck: no JVD Vascular: No carotid bruits Cardiac:  normal S1, S2; RRR; no murmur Lungs:  clear to auscultation bilaterally, no wheezing, rhonchi or rales  Abd: soft, nontender, no hepatomegaly  Ext: 1+ B LE edema with chronic skin changes Musculoskeletal:  No deformities, BUE and BLE strength normal and equal Skin: warm and dry  Neuro:  CNs 2-12 intact, no focal abnormalities noted Psych:  Normal affect   EKG:  The EKG was personally reviewed and demonstrates:  Sinus rhythm with ectopy, HR 65, iRBBB, LAFB Telemetry:  Telemetry was personally reviewed and demonstrates:  Sinus rhythm to sinus bradycardia with HR 50-60s, PVCs, NSVT  Relevant CV Studies:  Echo 10/15/19: 1. Left ventricular ejection fraction, by estimation, is 40 to 45%. The  left ventricle has mildly decreased function. The left ventricle  demonstrates regional wall motion abnormalities (see scoring  diagram/findings for description). There is mild  asymmetric left ventricular hypertrophy of the septal segment. Left  ventricular diastolic  parameters are consistent with Grade I diastolic  dysfunction (impaired relaxation).  2. Right ventricular systolic function is  normal. The right ventricular  size is normal. There is mildly elevated pulmonary artery systolic  pressure.  3. Left atrial size was severely dilated.  4. Moderate pleural effusion.  5. The mitral valve is normal in structure. Mild mitral valve  regurgitation. No evidence of mitral stenosis.  6. The aortic valve is tricuspid. Aortic valve regurgitation is not  visualized. Mild to moderate aortic valve sclerosis/calcification is  present, without any evidence of aortic stenosis.  7. The inferior vena cava is normal in size with greater than 50%  respiratory variability, suggesting right atrial pressure of 3 mmHg.    Nuclear medicine stress test 10/17/19: IMPRESSION: 1. Findings most consistent with LEFT ventricular apex infarction with peri-infarct ischemia most severe in the anterior wall but also involving the inferior wall. Overall large defect with moderate decreased perfusion.  2. Global hypokinesia.  Transient LEFT ventricular dilatation.  3. Left ventricular ejection fraction 40%  4. Non invasive risk stratification*: High   Laboratory Data:  High Sensitivity Troponin:   Recent Labs  Lab 10/14/19 2259 10/15/19 0052 11/06/19 0935  TROPONINIHS 20* 19* 1,618*     Chemistry Recent Labs  Lab 11/06/19 0935  NA 137  K 4.3  CL 106  CO2 20*  GLUCOSE 173*  BUN 55*  CREATININE 3.84*  CALCIUM 8.8*  GFRNONAA 15*  GFRAA 18*  ANIONGAP 11    No results for input(s): PROT, ALBUMIN, AST, ALT, ALKPHOS, BILITOT in the last 168 hours. Hematology Recent Labs  Lab 11/06/19 0935  WBC 13.2*  RBC 2.81*  HGB 8.5*  HCT 25.7*  MCV 91.5  MCH 30.2  MCHC 33.1  RDW 13.4  PLT 214   BNP Recent Labs  Lab 11/06/19 0935  BNP 4,075.9*    DDimer No results for input(s): DDIMER in the last 168 hours.   Radiology/Studies:  No results found.     TIMI Risk Score for Unstable Angina or Non-ST Elevation MI:   The patient's TIMI risk score is 5, which indicates a 26%  risk of all cause mortality, new or recurrent myocardial infarction or need for urgent revascularization in the next 14 days.   Assessment and Plan:   1. Syncope - patient was seen for hypertension in the [id number] systolic and medications adjusted: hydralazine 75 mg TID and isordil 40 mg TID - he took these for the first time this morning - pressure on arrival was 156/85 - unclear what his pressure was when EMS arrived - PVCs and NSVT on telemetry - will obtain Magnesium level - CXR pending - check pleural effusions   2. Hypertension - toprol 50 mg - hydralazine and isordil adjusted yesterday - he remains hypertensive - would continue present medications and monitor pressure ---> titrate medications as needed   3. NSTEMI 4. Abnormal stress test - hs troponin 1618 - EKG appears stable from prior  - angiography previously deferred due to renal function - medical therapy recommended - suspect demand ischemia - trend hs troponin, but will likely not submit to angiography given lack of symptoms and renal function   5. Ischemic cardiomyopathy 6. Chronic systolic and diastolic heart failrue - BNP > 4000 in the setting of CKD - CXR pending - may need a right heart cath to check pressures if dypsnea - has been diuresing on 80 mg lasix - will repeat a limited echo for LVEF - continue BB, hydralazine,  isordil, and lasix   7. Nephrotic syndrome 8. CKD stage IV - sCr 3.84, K 4.3 >> near baseline - per nephrology   9. Anemia of chronic disease - Hb stable in the mid-8's   Difficult situation. He does not give complete details of the episode and I am unable to reach his girlfriend who witnessed the event. He denies angina and symptoms of volume overload. I do not appreciate crackles or JVD on exam, but does have LE edema. Syncope with unclear etiology following adjustment to BP medications. NSVT and PVCs on telemetry. Recommend admission to medicine service. Conservative  management of his NSTEMI, suspect demand ischemia.        For questions or updates, please contact [place] Please consult [email or web address] for contact info under     Signed, Ledora Bottcher, [place]  11/06/2019 2:54 PM   Patient seen and examined. Agree with assessment and plan.  Dennis Macias is a 69 year old has known coronary artery disease with ischemic cardiomyopathy, chronic combined systolic and diastolic heart failure, stage IV chronic Macias disease, and dwelling Foley catheter status post TURP several weeks ago.  He was admitted in March 2021 with anasarca and pleural effusions treated without diuresis and underwent right thoracentesis.  During that admission from April 7 through April 15 a stress test showed LV apical infarction with peri-infarction ischemia in the anterior inferior walls.  An echo Doppler study showed EF 40 to 45% with severely dilated left atrium, grade 1 diastolic dysfunction, mild MR, and moderate pleural effusion with normal IVC size.  For his cardiomyopathy has been treated with nitrates/hydralazine with initial dosing of hydralazine at 37.5 3 times daily and Isordil 20 mg 3 times daily.  He was seen by his primary physician yesterday and was significantly hypertensive and his BiDil dosing was doubled to hydralazine 75 mg 3 times daily in addition to isosorbide 40 mg 3 times daily.  Patient denies any recent chest pain.  Today while sitting down experienced lightheadedness and presyncope without frank syncope.  He was unaware of any prodrome of cardiac arrhythmia, seizure activity, or chest pain.  He is pain-free and not short of breath.  Blood pressure is elevated was 170/100, now 160/95.  HEENT is unremarkable.  There was no significant jugular venous distention.  Lungs were relatively clear without wheezing.  Breath sound at the base.  Rhythm was regular with 2-5/9 systolic murmur.  There was no S3 gallop.  There was no hepatojugular reflux.  Bowel sounds are  present.  There was no significant edema.  Neurologic exam is grossly nonfocal.  ECG shows normal sinus rhythm at 65 bpm with an isolated PAC.  There is incomplete right bundle branch block and left anterior hemiblock.  QTc intervals slightly prolonged at 476 ms.  Laboratory revealed calcium 4.3.  Glucose 173.  BUN 55 creatinine 3.84, magnesium 1.2.  Stage IV/V CKD.  BNP 4075 initial high-sensitivity troponin 1618.  Anemic with hemoglobin 8.5 hematocrit 25.7.  LDL cholesterol 49.  He is completely pain-free.  He denies any complete loss of consciousness.  He continues to be hypertensive.  ECG does not reveal any acute ischemic changes.  For this reason I do not feel heparinization is warranted.  Chest x-ray does not reveal any pneumothorax.  There is no residual right pleural effusion status post thoracentesis.  There is aortic atherosclerosis with normal heart size and mediastinal contours.  And medical management.  At present we will continue with current hydralazine/nitrate for his CHF in  the setting of renal insufficiency.  We will obtain serial high-sensitivity troponin assessment and follow ECG.   Troy Sine, MD, [facility] 11/06/2019 4:18 PM

## 2019-11-06 NOTE — Telephone Encounter (Signed)
Surgeon has stated ok to wait for surgery until pt has been seen by his cardiologist Dr. Irish Lack on 11/17/19. I will forward clearance note to MD for upcoming appt. I will remove from the pre op call back pool.

## 2019-11-06 NOTE — H&P (Signed)
Date: 11/06/2019               Patient Name:  Dennis Macias MRN: [id number]  DOB: 09/18/1950 Age / Sex: 69 y.o., male   PCP: [name], MD              Medical Service: Internal Medicine Teaching Service              Attending Physician: Dr. Deno Etienne, DO    First Contact: Mitzie Na, MS IV Pager: [phone]  Second Contact: Dr. Lonia Skinner Pager: [phone]            After Hours (After 5p/  First Contact Pager: [phone]  weekends / holidays): Second Contact Pager: [phone]   Chief Complaint: syncopal episode this AM  History of Present Illness: Dennis Macias is a 69 y.o. male with a PMHx of CAD, ischemic cardiomyopathy, chronic systolic and diastolic heart failure, CKD stage IV, anemia of chronic disease, and BPH with chronic indwelling foley catheter s/p TURP 10/20/19 who presents here today after a syncopal episode this morning.   The patient was first admitted in March 2021 for anasarca and pleural effusions. At that time, he was treated with diuresis and right thoracentesis. The patient was then readmitted with discharge on 04/15 where he presented with SOB and was treated for CHF exacerbation and bilateral pleural effusions. He had an abnormal stress test at that time that showed LV apex infarction with peri-infarct ischemia in the anterior and inferior walls. Echo showed EF 40-45%, grade 1 DD, severely dilated LA, moderate pleural effusion, normal IVC size, mild MR. Given his renal function, medical therapy was recommended. He was subsequently started on hydralazine 37.5 TID and isordil 20 mg TID. He was discharged on BB, statin, 81 mg ASA and lasix.  Today, the patient reports that he sat down to eat breakfast this morning (around 6 AM), and the next thing he knew his girlfriend was calling paramedics. He does not recall exactly what happened, but the event was witnessed by his girlfriend. He states that he did not feel lightheaded, dizzy, or nauseous before the episode.  According to him, however, his girlfriend mentioned that he was "mumbling" during the episode. This has never happened to him before. The patient also states that his hydralazine regimen was increased yesterday. He last took this medication around 6 AM right before breakfast. He is unsure exactly what other medications he takes as they are mainly managed by his girlfriend. Otherwise, he has been asymptomatic and denies CP, palpitations, vomiting, HA, abdominal pain, dysuria, vision changes, SOB, and tongue biting. Dennis Macias states that he is able to get around his apartment without issue (no SOB or CP). No other complaints or concerns at this time.  The patient was hypertensive on arrival. Workup in the ED revealed troponins 1618, BNP 4075, WBC 13.2, Hb 8.5 (baseline), glucose 175, sCr 3.84 (up from 3.55 on 04/15), and an EKG showing sinus rhythm, atrial premature complexes, and prior RBBB and LAFBs.   Meds:  Current Meds  Medication Sig  . aspirin EC 81 MG tablet Take 1 tablet (81 mg total) by mouth daily.  Marland Kitchen atorvastatin (LIPITOR) 80 MG tablet Take 1 tablet (80 mg total) by mouth at bedtime. IM program  . dorzolamide-timolol (COSOPT) 22.3-6.8 MG/ML ophthalmic solution Place 1 drop into the right eye 2 (two) times daily.   . ferrous gluconate (FERGON) 324 MG tablet Take 324 mg by mouth daily with breakfast.  . finasteride (PROSCAR) 5 MG  tablet Take 5 mg by mouth daily.  . furosemide (LASIX) 80 MG tablet Take 1 tablet (80 mg total) by mouth daily.  . hydrALAZINE (APRESOLINE) 25 MG tablet Take 3 tablets (75 mg total) by mouth 3 (three) times daily.  . isosorbide dinitrate (ISORDIL) 20 MG tablet Take 2 tablets (40 mg total) by mouth 3 (three) times daily.  . metoprolol succinate (TOPROL-XL) 50 MG 24 hr tablet Take 1 tablet (50 mg total) by mouth daily. Take with or immediately following a meal.  . sodium bicarbonate 650 MG tablet Take 650 mg by mouth 2 (two) times daily.  . tamsulosin (FLOMAX) 0.4 MG  CAPS capsule Take 0.4 mg by mouth daily after supper.  . vitamin B-12 (CYANOCOBALAMIN) 1000 MCG tablet Take 1,000 mcg by mouth daily.     Allergies: Allergies as of 11/06/2019  . (No Known Allergies)   Past Medical History:  Diagnosis Date  . Anemia   . Arthritis    past hx   . Blindness    right eye  . Cataract    removed both eyes  . Dehydration   . Diabetes ([place])   . Glaucoma   . History of CVA (cerebrovascular accident) 09/13/2015  . History of urinary retention   . Hyperlipidemia   . Hypertension   . Pernicious anemia 02/24/2018  . Stroke [facility])    2017- March  . Tachycardia 08/26/2017  . Tubular adenoma of colon 02/2017  . Weight loss, non-intentional 08/26/2017   10 lbs between 6/18 & 2/19    Family History:  Family History  Problem Relation Age of Onset  . Hypertension Mother   . Hyperlipidemia Mother   . Hyperlipidemia Father   . Colon cancer Neg Hx   . Colon polyps Neg Hx   . Esophageal cancer Neg Hx   . Rectal cancer Neg Hx   . Stomach cancer Neg Hx     Social History:  Social History   Socioeconomic History  . Marital status: Married    Spouse name: Not on file  . Number of children: Not on file  . Years of education: Not on file  . Highest education level: Not on file  Occupational History  . Not on file  Tobacco Use  . Smoking status: Former Research scientist (life sciences)  . Smokeless tobacco: Former Systems developer    Types: Chew    Quit date: 07/09/1978  . Tobacco comment: quit 1 year ago  Substance and Sexual Activity  . Alcohol use: No    Alcohol/week: 1.0 standard drinks    Types: 1 Cans of beer per week    Comment: quit last march/2017  . Drug use: No  . Sexual activity: Not on file  Other Topics Concern  . Not on file  Social History Narrative  . Not on file   Social Determinants of Health   Financial Resource Strain:   . Difficulty of Paying Living Expenses:   Food Insecurity:   . Worried About Charity fundraiser in the Last Year:   . Arboriculturist in  the Last Year:   Transportation Needs:   . Film/video editor (Medical):   Marland Kitchen Lack of Transportation (Non-Medical):   Physical Activity:   . Days of Exercise per Week:   . Minutes of Exercise per Session:   Stress:   . Feeling of Stress :   Social Connections:   . Frequency of Communication with Friends and Family:   . Frequency of Social Gatherings with Friends  and Family:   . Attends Religious Services:   . Active Member of Clubs or Organizations:   . Attends Archivist Meetings:   Marland Kitchen Marital Status:     Review of Systems: A complete ROS was negative except as per HPI.   Physical Exam: Blood pressure (!) 171/102, pulse 61, temperature 98.4 F (36.9 C), temperature source Oral, resp. rate 20, height 6' (1.829 m), weight 72.6 kg, SpO2 98 %. Constitutional:      Appearance: He is well-developed, well-nourished, NAD HENT:     Head: Normocephalic and atraumatic.  Eyes:     Pupils: Left pupil is equal, round, and reactive to light. Right pupil sluggishly reactive to light 2/2 underlying diabetic optic neuropathy Neck:     Vascular: No JVD. No carotid bruits. Cardiovascular:     Rate and Rhythm: Normal rate and regular rhythm.     Heart sounds: No murmur. No friction rub. No gallop.      Radial pulses intact bilaterally Pulmonary:     Effort: No respiratory distress.     Breath sounds: No wheezing.  Abdominal:     General: There is no distension.     Tenderness: There is no abdominal tenderness. There is no guarding or rebound.  Extremities: +1 pitting edema bilaterally Skin:    Coloration: Skin is not pale.     Findings: No rash.  Neurological:     Mental Status: He is alert and oriented to person, place, and time. Had some difficulty answering questions but otherwise unremarkable. Psychiatric:        Behavior: Behavior normal.   EKG: personally reviewed my interpretation is sinus rhythm with some atrial premature complexes; prior RBBB and LAFBs.       Assessment & Plan by Problem: Dennis Macias is a 69 y.o. male with a PMHx of CAD, ischemic cardiomyopathy, chronic systolic and diastolic heart failure, CKD stage IV, anemia of chronic disease, and BPH with chronic indwelling foley catheter s/p TURP 10/20/19 who presents here today after a syncopal episode this morning. At this time, the differential diagnosis includes syncope due to transient hypotension from medication increase, vasovagal syncope, and arrhythmia.   #Syncopal episode The patient's medication regimen was recently adjusted after he called his PCP yesterday with concerns for worsening HTN with systolic BP of 431V-400Q. At that time, his hydralazine regimen was increased to 75 mg TID and his isordil regimen was increased to 40 mg TID. The patient states that he first took these increased doses last night and also took these medications this morning before his syncopal event. He denies any presyncopal symptoms such as lightheadedness, nausea, or diaphoresis. This syncopal episode is likely secondary to his recent increased in BP medication. Vagal syncope is also likely, though the patient was asymptomatic beforehand. Other potential etiologies include underlying heart conditions such as heart failure or arrhythmia. Other items on the differential include seizure, but this is less likely due to absence of postictal state, no PMHx of seizures, and no urinary incontinence. However, patient had a prior CVA which could increase the risk of seizures.  - Cardiology on board, appreciate their recs - Magnesium level pending, given questionable prolonged QT on EKG today - Orthostatic vitals - Telemetry - Pain control with Tylenol, PRN  #Hypertension Patient with recent increase in hydralazine and isordil regimens. However, he remains hypertensive at this time. Currently asymptomatic. We will continue his current medications and titrate them as needed. - Metoprolol, 50 mg PO - Hydralazine, 80  mg  PO - Isordil 40 mg, PO  #Chronic systolic and diastolic heart failure Patient was recently admitted here with discharge on 04/15 for CHF exacerbation and bilateral pleural effusions. His most recent echo showed EF 40-45%, grade 1 DD, severely dilated LA, moderate pleural effusion, normal IVC size, mild MR.  Today, his BNP >4000 in the setting of his CKD. This could also have also contributed to his syncopal episode this morning. Additionally, given his history of heart failure, he is at a high risk of arrhythmia. Therefore, we will continue workup for cardiac causes of his presentation today. - CXR pending to evaluate for pleural effusions - Per cardio, the patient may need a right heart cath to check pressures - Echocardiogram pending - Lasix, 80 mg - Daily weights - Strict Is/Os  #Elevated troponins #Abnormal stress test, 04/08 During his last admission, the patient had an abnormal stress test showing LV apex infarction with peri-infarct ischemia in the anterior and inferior walls. At this time, the patient is asymptomatic. His most recent troponin level was 1618. Overall, his EKG appears stable from prior. His elevated troponin levels could most be due to demand ischemia. Additionally, cardiology will not pursue angiography at this time due to his renal function. At this time, we will trend his troponins and follow cardiology recommendations.  - Trend troponins - Daily ASA 81 mg  #HLD - Lipitor, 80 mg PO  #BPH - Tamsulosin, 0.4 mg PO - Finasteride, 5 mg PO  #CKD stage IV The patient's sCr is 3.84, which is near the patient's baseline. - Daily CMP - Sodium bicarbonate, 650 mg PO  #Anemia of Chronic Disease - Daily CBC - Ferrous gluconate, 324 mg PO  #T2DM - SSI - Last HbA1c 5.2 on 10/13/19, no medications listed at home  #Right eye blindness - Cosopt drops to right eye for possible diabetic optic neuropathy  #DVT prophylaxis - SQH  #FEN/GI - Renal/CM  Dispo: Admit  patient to Inpatient with expected length of stay greater than 2 midnights.  Signed: Orvis Brill, Medical Student 11/06/2019, 4:16 PM  Pager: [phone]  . I have seen and examined the patient myself, and I have reviewed the note by Joannie Springs MS4 and was present during the interview and physical exam. I have made corrections to and confirm that the information included in the note is correct.   Signed: [name], MD 11/06/2019, 6:42 PM

## 2019-11-07 ENCOUNTER — Inpatient Hospital Stay (HOSPITAL_COMMUNITY): Payer: Medicare PPO

## 2019-11-07 DIAGNOSIS — R55 Syncope and collapse: Secondary | ICD-10-CM | POA: Diagnosis not present

## 2019-11-07 HISTORY — DX: Syncope and collapse: R55

## 2019-11-07 LAB — URINALYSIS, ROUTINE W REFLEX MICROSCOPIC
Bilirubin Urine: NEGATIVE
Glucose, UA: 50 mg/dL — AB
Ketones, ur: NEGATIVE mg/dL
Nitrite: NEGATIVE
Protein, ur: >=300 mg/dL — AB
RBC / HPF: >50 RBC/hpf — ABNORMAL HIGH (ref 0–5)
Specific Gravity, Urine: 1.015 (ref 1.005–1.030)
WBC, UA: >50 WBC/hpf — ABNORMAL HIGH (ref 0–5)
pH: 5 (ref 5.0–8.0)

## 2019-11-07 LAB — BASIC METABOLIC PANEL
Anion gap: 11 (ref 5–15)
BUN: 54 mg/dL — ABNORMAL HIGH (ref 8–23)
CO2: 20 mmol/L — ABNORMAL LOW (ref 22–32)
Calcium: 9 mg/dL (ref 8.9–10.3)
Chloride: 106 mmol/L (ref 98–111)
Creatinine, Ser: 3.94 mg/dL — ABNORMAL HIGH (ref 0.61–1.24)
GFR calc Af Amer: 17 mL/min — ABNORMAL LOW (ref >60)
GFR calc non Af Amer: 15 mL/min — ABNORMAL LOW (ref >60)
Glucose, Bld: 126 mg/dL — ABNORMAL HIGH (ref 70–99)
Potassium: 4.1 mmol/L (ref 3.5–5.1)
Sodium: 137 mmol/L (ref 135–145)

## 2019-11-07 LAB — CBC
HCT: 24.8 % — ABNORMAL LOW (ref 39.0–52.0)
Hemoglobin: 8.4 g/dL — ABNORMAL LOW (ref 13.0–17.0)
MCH: 30 pg (ref 26.0–34.0)
MCHC: 33.9 g/dL (ref 30.0–36.0)
MCV: 88.6 fL (ref 80.0–100.0)
Platelets: 196 10*3/uL (ref 150–400)
RBC: 2.8 MIL/uL — ABNORMAL LOW (ref 4.22–5.81)
RDW: 13.2 % (ref 11.5–15.5)
WBC: 8.7 10*3/uL (ref 4.0–10.5)
nRBC: 0 % (ref 0.0–0.2)

## 2019-11-07 LAB — GLUCOSE, CAPILLARY
Glucose-Capillary: 127 mg/dL — ABNORMAL HIGH (ref 70–99)
Glucose-Capillary: 135 mg/dL — ABNORMAL HIGH (ref 70–99)
Glucose-Capillary: 137 mg/dL — ABNORMAL HIGH (ref 70–99)

## 2019-11-07 NOTE — Progress Notes (Signed)
Date: 11/07/2019  Patient name: Shahan Starks  Medical record number: [id number]  Date of birth: 19-Feb-1951   I have seen and evaluated Dorita Fray and discussed their care with the Residency Team.  Mr. Obar is a 69 year old community dwelling gentleman with past medical history of CAD, ischemic cardiomyopathy, chronic systolic and diastolic heart failure, and CKD stage IV.  He was in his usual state of health on the morning of admission, was sitting at the kitchen table eating breakfast with his girlfriend.  Reportedly he had a syncopal episode lasting about 10 seconds per ER triage notes since they were able to speak to the girlfriend.  The admitting team was able to get additional history including that he had no presyncopal symptoms and that the girlfriend said he was mumbling during the episode.  EMS documented a glucose of 210, heart rate 64, O2 sat 90% on room air, and a systolic blood pressure of 130.  This morning, he states he has never had an episode like this before and denies any other episodes of syncope or orthostatic symptoms.  The admitting team did give the history that his hydralazine was increased on the day prior to admission.  Vitals:   11/07/19 0926 11/07/19 0935  BP:  (!) 187/89  Pulse: 99 (!) 102  Resp:    Temp: (!) 101.9 F (38.8 C)   SpO2: 93% 93%  Blood pressure 187/89 Respiratory rate 18 No tachycardia until fever General NAD, lying in bed, able to speak in full sentences H RRR no MRG , no lower extremity edema L CTA B anteriorly Musculoskeletal no deformities, no tenderness, no swelling  Pertinent labs Glucose 173 Bicarb 20 Creatinine 3.84 3.94 BNP 4000 Troponin peaked at 2300 WBC 13.2 - 837 Hemoglobin 8.4 stable  I personally viewed the EKG and confirmed my reading with the official read. Sinus, LAD, incomplete right bundle, no ischemic changes  Assessment and Plan: I have seen and evaluated the patient as outlined above. I agree with the formulated  Assessment and Plan as detailed in the residents' note, with the following changes: Mr. Schwegler is a 69 year old community dwelling gentleman with past medical history of CAD, ischemic cardiomyopathy, chronic systolic and diastolic heart failure, and CKD stage IV.  He was admitted yesterday for syncope without preceding symptoms.  Although his blood pressure was increased the day prior to his syncope, I doubt this is the etiology as he had no presyncopal symptoms.  Vasovagal is also unlikely.  We discussed PE but his Wells score is low probability, no tachycardia about a beta-blocker, no history of personal VTE, and no findings on examination to make DVT likely.  Cardiology is working up arrhythmia and is considering a catheterization although his renal function is going to make dye administration challenging.  1.  Syncope, etiology currently unknown - F/U cardiology's recommendation regarding catheterization to work-up ischemia induced arrhythmia.  Continue telemetry.  D-dimer on blood obtained on admission as a can rule out PE since he is low risk.  If it is positive, check lower extremity Dopplers.  2.  Stage IV CKD - will make catheterization challenging and agree with recommendation to get nephrology consult if cath is to try to be necessary.  3.  Combined chronic systolic and diastolic heart failure - he is currently euvolemic.  Follow weights and I's and O's.  4.  Fever - he had a temperature of 101.9 today.  Etiology is currently not clear.  He did not have a UA or chest  x-ray on admission and Dr. Sherry Ruffing has ordered both of these along with blood cultures and urine cultures.  Agree with holding off on antibiotics as he is not clinically unstable and this will help [place] to locate a source.  Other problems per Dr.Krienke's note.  Bartholomew Crews, MD 5/1/202112:45 PM

## 2019-11-07 NOTE — Progress Notes (Signed)
Subjective: Patient reports that he is feeling okay today, denies any light headedness, dizziness, fatigue, chest pain, palpitations, shortness of breath, or other issues. He reports that he has never had a blood clot that he knows of. He has not had any syncopal episodes.   Objective:  Vital signs in last 24 hours: Vitals:   11/06/19 2112 11/06/19 2115 11/07/19 0034 11/07/19 0445  BP: 140/63 133/68 (!) 172/85 (!) 172/77  Pulse: (!) 56 (!) 58 69 70  Resp: 16 18 16 18   Temp:   98 F (36.7 C) 98.2 F (36.8 C)  TempSrc:   Oral Oral  SpO2: 100% 97% 96% 95%  Weight:  70.3 kg    Height:       General: Elderly male, NAD, laying in bed Cardiac: RRR, no m/r/g Pulmonary: CTABL, no wheezing, rhonchi, rales Abdomen: Soft, non-tender, non-distended Extremities: No LE edema  Assessment/Plan:  Active Problems:   Syncope   This is a 69 year old male with a history of CAD chronic systolic and diastolic heart failure, CKD stage IV, anemia of chronic disease, and BPH with chronic indwelling Foley catheter status post TURP who presented with a syncopal episode that occurred this morning.\  Syncope: Patient had an episode of syncope that occurred during breakfast, no presyncopal times or seizure activity is noted.  Reported increase in his home BP medications yesterday and he had just taken his medications prior to the episode.  He denied any other symptoms. Another consideration includes PE, he doesn't have any tachycardia however he is on a beta blocker which could be suppressing this, he also has a low Wells score so this is less likely. Labs significant for troponin elevation of 1600 > 2300 > 2200.  He denied any chest pain or shortness of breath.  Telemetry showed frequent PVCs and nonsustained v.tach.  Cardiology following, discussed possibility of doing cardiac catheterization, patient and family will discuss doing this.  CBC showed chronic anemia.  BMP showed continued renal  insufficiency.  -Cardiology following, appreciate recommendations -Follow-up orthostatic vital signs -Continue telemetry -Continue home BP medications  Hypertension: -Patient recently had his hydralazine and isosorbide medications increased at home.  May have contributed to his syncopal episode, however patient does remain hypertensive and we will continue home medications for now.  Chronic systolic and diastolic heart failure: -BNP elevated to>4000. No chest x-ray done at this time. Resumed his home Lasix.  Output of 1300 yesterday.  Weight not recorded.  -Checking CXR  Fever of 101.9: Noted today, patient developed fevers, chills, and some diaphoresis. Unclear etiology at this time. He had a leukocytosis of 13 on admission however this has improved. He denies any chest pain, SOB, cough, nausea, vomiting, or other symptoms. Other vitals have been stable so this does not appear to be sepsis. He does have a indwelling catheter that is a potential source of infection. One possibility is a PE, he does have a low Wells score however given the syncope and new onset fever this is more concerning. Since he is HD stable will obtain further imaging and testing, will hold off on antibiotics for now.   -CXR -Blood cultures -U/A and urine cultures -Will contact lab to see if blood collected from ED is available, will add D-dimer if it is, d-dimer studies only done on ED testing -Tylenol for fevers  CKD stage IV: -Creatinine on admission was 3.84, near his baseline.  Creatinine today and 2.94.  Continues to have urinary output.  We will continue his home  sodium bicarb.  Anemia of chronic disease: -Continue ferrous gluconate.  Prior to Admission Living Arrangement: Home Anticipated Discharge Location: TBD Barriers to Discharge: Pending further evalaution Dispo: Anticipated discharge in approximately 2-3 day(s).   Dennis Noble, MD 11/07/2019, 6:10 AM Pager: [phone]

## 2019-11-07 NOTE — Progress Notes (Signed)
Progress Note  Patient Name: Dennis Macias Date of Encounter: 11/07/2019  Primary Cardiologist: Larae Grooms, MD   Subjective   No CP or dyspnea  Inpatient Medications    Scheduled Meds: . aspirin EC  81 mg Oral Daily  . atorvastatin  80 mg Oral QHS  . dorzolamide-timolol  1 drop Right Eye BID  . ferrous gluconate  324 mg Oral Q breakfast  . finasteride  5 mg Oral Daily  . furosemide  80 mg Oral Daily  . heparin  5,000 Units Subcutaneous Q8H  . hydrALAZINE  75 mg Oral TID  . insulin aspart  0-9 Units Subcutaneous TID WC  . isosorbide dinitrate  40 mg Oral TID  . metoprolol succinate  50 mg Oral Daily  . sodium bicarbonate  650 mg Oral BID  . tamsulosin  0.4 mg Oral QPC supper  . vitamin B-12  1,000 mcg Oral Daily   Continuous Infusions:  PRN Meds: acetaminophen **OR** acetaminophen   Vital Signs    Vitals:   11/07/19 0834 11/07/19 0904 11/07/19 0926 11/07/19 0935  BP: (!) 175/92 (!) 165/77  (!) 187/89  Pulse: 96 99 99 (!) 102  Resp:      Temp:   (!) 101.9 F (38.8 C)   TempSrc:      SpO2: 94% 94% 93% 93%  Weight:      Height:        Intake/Output Summary (Last 24 hours) at 11/07/2019 1124 Last data filed at 11/07/2019 0531 Gross per 24 hour  Intake --  Output 1300 ml  Net -1300 ml   Last 3 Weights 11/06/2019 11/06/2019 11/05/2019  Weight (lbs) 155 lb 160 lb 161 lb 8 oz  Weight (kg) 70.308 kg 72.576 kg 73.256 kg      Telemetry    Sinus - Personally Reviewed  Physical Exam   GEN: No acute distress.   Neck: No JVD Cardiac: RRR, no murmurs, rubs, or gallops.  Respiratory: Clear to auscultation bilaterally. GI: Soft, nontender, non-distended  MS: No edema Neuro:  Nonfocal  Psych: Normal affect   Labs    High Sensitivity Troponin:   Recent Labs  Lab 10/14/19 2259 10/15/19 0052 11/06/19 0935 11/06/19 1924 11/06/19 2235  TROPONINIHS 20* 19* 1,618* 2,384* 2,233*      Chemistry Recent Labs  Lab 11/06/19 0935 11/07/19 0318  NA 137 137   K 4.3 4.1  CL 106 106  CO2 20* 20*  GLUCOSE 173* 126*  BUN 55* 54*  CREATININE 3.84* 3.94*  CALCIUM 8.8* 9.0  GFRNONAA 15* 15*  GFRAA 18* 17*  ANIONGAP 11 11     Hematology Recent Labs  Lab 11/06/19 0935 11/07/19 0318  WBC 13.2* 8.7  RBC 2.81* 2.80*  HGB 8.5* 8.4*  HCT 25.7* 24.8*  MCV 91.5 88.6  MCH 30.2 30.0  MCHC 33.1 33.9  RDW 13.4 13.2  PLT 214 196    BNP Recent Labs  Lab 11/06/19 0935  BNP 4,075.9*    Patient Profile     69 y.o. male with past medical history of coronary artery disease, ischemic cardiomyopathy, hypertension, hyperlipidemia, chronic stage IV kidney disease, chronic indwelling Foley catheter, history of CVA for evaluation of syncope.  Echocardiogram April 2021 showed ejection fraction 40 to 54%, grade 1 diastolic dysfunction, severe left atrial enlargement, mild mitral regurgitation.  Nuclear study April 2021 showed ejection fraction 40%.  There was apical infarction with peri-infarct ischemia.  Patient has been treated medically due to renal insufficiency and lack of  symptoms.  Assessment & Plan    1 syncope-etiology unclear.  However description is concerning for possible arrhythmia.  Per notes he had nonsustained ventricular tachycardia and PVCs on telemetry.  He has reduced LV function and also has ischemia on his recent nuclear study.  I wonder if he may have had ischemia mediated ventricular arrhythmia.  I have discussed the possibility of cardiac catheterization for definitive evaluation of his coronaries.  I explained the risks of contrast nephropathy including dialysis.  He and his family will consider.  In the meantime we will continue telemetry.  2 chronic stage IV kidney disease-if he decides to proceed with catheterization would need nephrology to follow.  3 ischemic cardiomyopathy-continue beta-blocker, hydralazine and nitrates.  4 hypertension-continue present medications and adjust regimen based on follow-up readings.  For  questions or updates, please contact [facility] Please consult [email or web address] for contact info under        Signed, Kirk Ruths, MD  11/07/2019, 11:24 AM

## 2019-11-07 NOTE — Progress Notes (Signed)
UPDATE NOTE:  Spoke with pt's daughter, [name], regarding current hospitalization. Updated her regarding ongoing talks about possible heart cath vs renal function. All questions answered.  [name], MD 11/07/19 12:36 PM

## 2019-11-07 NOTE — Progress Notes (Signed)
Oral temp = 101.9. Pt had been shivering earlier and noted to be diaphoretic. Pt denies chills.

## 2019-11-08 DIAGNOSIS — R55 Syncope and collapse: Secondary | ICD-10-CM | POA: Diagnosis not present

## 2019-11-08 LAB — GLUCOSE, CAPILLARY
Glucose-Capillary: 134 mg/dL — ABNORMAL HIGH (ref 70–99)
Glucose-Capillary: 99 mg/dL (ref 70–99)
Glucose-Capillary: 110 mg/dL — ABNORMAL HIGH (ref 70–99)
Glucose-Capillary: 119 mg/dL — ABNORMAL HIGH (ref 70–99)

## 2019-11-08 LAB — BASIC METABOLIC PANEL
Anion gap: 9 (ref 5–15)
BUN: 59 mg/dL — ABNORMAL HIGH (ref 8–23)
CO2: 20 mmol/L — ABNORMAL LOW (ref 22–32)
Calcium: 8.6 mg/dL — ABNORMAL LOW (ref 8.9–10.3)
Chloride: 109 mmol/L (ref 98–111)
Creatinine, Ser: 4.13 mg/dL — ABNORMAL HIGH (ref 0.61–1.24)
GFR calc Af Amer: 16 mL/min — ABNORMAL LOW (ref >60)
GFR calc non Af Amer: 14 mL/min — ABNORMAL LOW (ref >60)
Glucose, Bld: 152 mg/dL — ABNORMAL HIGH (ref 70–99)
Potassium: 4.1 mmol/L (ref 3.5–5.1)
Sodium: 138 mmol/L (ref 135–145)

## 2019-11-08 LAB — CBC
HCT: 24.5 % — ABNORMAL LOW (ref 39.0–52.0)
Hemoglobin: 8.1 g/dL — ABNORMAL LOW (ref 13.0–17.0)
MCH: 29.9 pg (ref 26.0–34.0)
MCHC: 33.1 g/dL (ref 30.0–36.0)
MCV: 90.4 fL (ref 80.0–100.0)
Platelets: 193 10*3/uL (ref 150–400)
RBC: 2.71 MIL/uL — ABNORMAL LOW (ref 4.22–5.81)
RDW: 13.3 % (ref 11.5–15.5)
WBC: 8.2 10*3/uL (ref 4.0–10.5)
nRBC: 0 % (ref 0.0–0.2)

## 2019-11-08 MED ORDER — SODIUM CHLORIDE 0.9 % IV SOLN
2.0000 g | INTRAVENOUS | Status: DC
  Administered 2019-11-08 – 2019-11-11 (×4): 2 g via INTRAVENOUS
  Filled 2019-11-08 (×4): qty 2

## 2019-11-08 MED ORDER — SODIUM CHLORIDE 0.9 % IV SOLN: INTRAVENOUS | Status: AC

## 2019-11-08 MED ORDER — SODIUM CHLORIDE 0.9 % IV SOLN
1.0000 g | INTRAVENOUS | Status: DC
  Administered 2019-11-08: 1 g via INTRAVENOUS
  Filled 2019-11-08: qty 10

## 2019-11-08 MED ORDER — HYDRALAZINE HCL 50 MG PO TABS
100.0000 mg | ORAL_TABLET | Freq: Three times a day (TID) | ORAL | Status: DC
  Administered 2019-11-08 – 2019-11-21 (×38): 100 mg via ORAL
  Filled 2019-11-08 (×38): qty 2

## 2019-11-08 NOTE — Progress Notes (Addendum)
Subjective: The patient does not endorse any specific complaints today. He is febrile but denies symptoms such as chills, dysuria, CP, or SOB. The patient states that he was just seen by cardiology; he and his family agree with them for plan for cardiac catheterization. There are no other complaints or concerns.  Objective:  Vital signs in last 24 hours: Vitals:   11/07/19 2205 11/08/19 0446 11/08/19 0730 11/08/19 0800  BP: (!) 181/84 (!) 153/69 (!) 159/74   Pulse:  72  76  Resp:  20 19   Temp:  98.5 F (36.9 C) (!) 101.7 F (38.7 C)   TempSrc:  Oral Oral   SpO2:  93% 95% 94%  Weight:  80 kg    Height:       General: Elderly male, NAD, laying in bed Cardiac: RRR, no m/r/g Pulmonary: CTABL, no wheezing, rhonchi, rales Abdomen: Soft, non-tender, non-distended Extremities: No LE edema  Assessment/Plan:  Dennis Macias is a 69 y.o. male with a history of CAD chronic systolic and diastolic heart failure, CKD stage IV, anemia of chronic disease, and BPH with chronic indwelling Foley catheter status post TURP who presented with a syncopal episode that occurred on 11/06/19.    Active Problems:   Syncope  Syncope: Patient had an episode of syncope that occurred during breakfast on 04/30; no presyncopal times or seizure activity is noted. Reported increase in his home BP medications on 04/29 and he had just taken his medications prior to the episode. Labs were significant for troponin elevation of 1600 > 2300 > 2200. Differential includes arrhythmia; cardiology is on board and planning catheterization although his renal function makes this challenging given risk of contrast nephropathy. Other items on the differential include PE, though this is less likely with his low Wells score. Vasovagal syncope is also unlikely given lack of presyncopal symptoms. -Cardiology following, appreciate recommendations -Pt and family agree with plan for catheterization to be performed by cardiology. This will be  performed after his fever resolves. -Continue telemetry -Continue home BP medications  Hypertension: -Patient recently had his hydralazine and isosorbide medications increased at home. As discussed above, this may have contributed to his syncopal episode; however, the patient remains hypertensive, and we will continue his home medications for now. -Hydralazine 100 mg PO TID per cardiology  Chronic systolic and diastolic heart failure: BNP on admission elevated to>4000. Recent CXR shows cardiomegaly and bilateral pleural effusions. Patient is currently on home Lasix. He had a urine output of 1300 mL yesterday and 440 mL this AM. Last recorded dry weight was 80.9 kg in Feb 2021, although his weight was recorded at 70.3 kg on 04/30, and it is now 80 kg today. Will continue diuresis at this time while monitoring urine output and weight changes during his admission. -Continue home Lasix -Strict I's/O's -Daily weights  UTI: Yesterday, the patient developed fevers, chills, and some diaphoresis. He is otherwise asymptomatic at this time. The exact etiology is unclear at this time, though his indwelling catheter could be a potential source of infection. His other vital signs are stable so this does not appear to be sepsis. Further workup was pursued; UA demonstrated pyuria, and blood cultures x2 and urine cultures are currently pending. Other potential causes include PE, though his Wells score is low. CXR showed cardiomegaly and bilateral pleural effusions; otherwise, it did not demonstrate findings suggesting pneumonia as a source of infection. Given findings on UA, will start broad spectrum antibiotic coverage and adjust regimen based on results  from blood and urine cultures. Given the patient's history of CKD, will plan for ceftriaxone regimen per pharmacy recommendations. -Blood cultures are currently pending -Urine cultures showed psuedomonas, sensitivities pending -Ordered ceftriaxone per pharmacy,  switched to cefepime based on culture results -Tylenol for fevers  CKD stage IV: Creatinine on admission was 3.84, near his baseline. Creatinine today is increased to 4.54. Cardiology is planning cardiac catheterization, but his renal function makes this challenging given risk of contrast nephropathy with this procedure. Therefore, nephrology has been consulted for further recommendations. -Nephrology consulted; appreciate their recs -Sodium bicarbonate  Anemia of chronic disease: -Continue ferrous gluconate.  Prior to Admission Living Arrangement: Home Anticipated Discharge Location: TBD Barriers to Discharge: Pending further evalaution Dispo: Anticipated discharge in approximately 1-2 day(s).   Orvis Brill, Medical Student 11/08/2019, 9:04 AM Pager: [phone]

## 2019-11-08 NOTE — Progress Notes (Signed)
Progress Note  Patient Name: Dennis Macias Date of Encounter: 11/08/2019  Primary Cardiologist: Larae Grooms, MD   Subjective   Pt denies CP or dyspnea  Inpatient Medications    Scheduled Meds: . aspirin EC  81 mg Oral Daily  . atorvastatin  80 mg Oral QHS  . dorzolamide-timolol  1 drop Right Eye BID  . ferrous gluconate  324 mg Oral Q breakfast  . finasteride  5 mg Oral Daily  . furosemide  80 mg Oral Daily  . heparin  5,000 Units Subcutaneous Q8H  . hydrALAZINE  75 mg Oral TID  . insulin aspart  0-9 Units Subcutaneous TID WC  . isosorbide dinitrate  40 mg Oral TID  . metoprolol succinate  50 mg Oral Daily  . sodium bicarbonate  650 mg Oral BID  . tamsulosin  0.4 mg Oral QPC supper  . vitamin B-12  1,000 mcg Oral Daily   Continuous Infusions:  PRN Meds: acetaminophen **OR** acetaminophen   Vital Signs    Vitals:   11/07/19 2205 11/08/19 0446 11/08/19 0730 11/08/19 0800  BP: (!) 181/84 (!) 153/69 (!) 159/74   Pulse:  72  76  Resp:  20 19   Temp:  98.5 F (36.9 C) (!) 101.7 F (38.7 C)   TempSrc:  Oral Oral   SpO2:  93% 95% 94%  Weight:  80 kg    Height:        Intake/Output Summary (Last 24 hours) at 11/08/2019 0812 Last data filed at 11/07/2019 1730 Gross per 24 hour  Intake 440 ml  Output --  Net 440 ml   Last 3 Weights 11/08/2019 11/06/2019 11/06/2019  Weight (lbs) 176 lb 5.9 oz 155 lb 160 lb  Weight (kg) 80 kg 70.308 kg 72.576 kg      Telemetry    Sinus - Personally Reviewed  Physical Exam   GEN: No acute distress.  WD Neck: No JVD, supple Cardiac: RRR Respiratory: CTA GI: Soft, NT/ND MS: No edema Neuro:  Grossly intact Psych: Normal affect   Labs    High Sensitivity Troponin:   Recent Labs  Lab 10/14/19 2259 10/15/19 0052 11/06/19 0935 11/06/19 1924 11/06/19 2235  TROPONINIHS 20* 19* 1,618* 2,384* 2,233*      Chemistry Recent Labs  Lab 11/06/19 0935 11/07/19 0318 11/08/19 0445  NA 137 137 138  K 4.3 4.1 4.1  CL 106 106  109  CO2 20* 20* 20*  GLUCOSE 173* 126* 152*  BUN 55* 54* 59*  CREATININE 3.84* 3.94* 4.13*  CALCIUM 8.8* 9.0 8.6*  GFRNONAA 15* 15* 14*  GFRAA 18* 17* 16*  ANIONGAP 11 11 9      Hematology Recent Labs  Lab 11/06/19 0935 11/07/19 0318 11/08/19 0445  WBC 13.2* 8.7 8.2  RBC 2.81* 2.80* 2.71*  HGB 8.5* 8.4* 8.1*  HCT 25.7* 24.8* 24.5*  MCV 91.5 88.6 90.4  MCH 30.2 30.0 29.9  MCHC 33.1 33.9 33.1  RDW 13.4 13.2 13.3  PLT 214 196 193    BNP Recent Labs  Lab 11/06/19 0935  BNP 4,075.9*    Patient Profile     69 y.o. male with past medical history of coronary artery disease, ischemic cardiomyopathy, hypertension, hyperlipidemia, chronic stage IV kidney disease, chronic indwelling Foley catheter, history of CVA for evaluation of syncope.  Echocardiogram April 2021 showed ejection fraction 40 to 87%, grade 1 diastolic dysfunction, severe left atrial enlargement, mild mitral regurgitation.  Nuclear study April 2021 showed ejection fraction 40%.  There was  apical infarction with peri-infarct ischemia.  Patient has been treated medically due to renal insufficiency and lack of symptoms.  Assessment & Plan    1 syncope-etiology unclear.  However description is concerning for possible arrhythmia.  Per notes he had nonsustained ventricular tachycardia and PVCs on telemetry.  His troponin is also elevated compared to previous though in the setting of renal insufficiency.  He has reduced LV function and also had ischemia on his recent nuclear study.  I wonder if he may have had ischemia mediated ventricular arrhythmia.  I have discussed the possibility of cardiac catheterization for definitive evaluation of his coronaries.  I explained the risks of contrast nephropathy including dialysis.  He is agreeable to proceed.  We will plan catheterization after his fever resolves.  2 chronic stage IV kidney disease-would ask nephrology to see as he may require dialysis following catheterization.  3  ischemic cardiomyopathy-continue beta-blocker, hydralazine and nitrates.  4 hypertension-blood pressure elevated.  Increase hydralazine to 100 mg p.o. 3 times daily and follow.  5 fever-etiology unclear.  Further work-up per primary service.  For questions or updates, please contact [place] Please consult [email or web address] for contact info under        Signed, Kirk Ruths, MD  11/08/2019, 8:12 AM

## 2019-11-08 NOTE — Plan of Care (Signed)
  Problem: Clinical Measurements: Goal: Cardiovascular complication will be avoided Outcome: Adequate for Discharge   

## 2019-11-08 NOTE — Consult Note (Signed)
[facility] Renal Consultation Note  Requesting MD: Larey Dresser, MD  Indication for Consultation:  AKI   Chief complaint:  "they said I blacked out"  HPI:  Dennis Macias is a 69 y.o. male with a history of coronary artery disease, ischemic cardiomyopathy, chronic systolic and diastolic CHF, as well as CKD stage IV who presented to the hospital after syncopal episode.  He cannot tell me much other than "they said I blacked out".  Cardiology has been consulted for possible catheterization and there is concern for possible arrhythmia contributing.  Nephrology is consulted for assistance with management of AKI and anticipation use of contrast.  Creatinine is trended as below.  Creatinine 4.13 today and was 3.84 admission 4/30.  He had a recent admission in April with creatinine ranging from 3.3-3.6.  Prior to March 2021 his baseline creatinine was 2.5 to 3 but has been quite variable.  Found to have a pseudomonas UTI this admission.  UA with proteinuria and > 50 RBC with leuk esterase and WBC.  CXR with new small to moderate bilateral pleural effusions.  09/2019 CT a/p with kidneys unremarkable.  He has been on lasix 80 mg PO daily here.  He has been on flomax and finasteride and has had a condom cath here.  States a foley was removed last week.  Note he follows with Dr. Hollie Salk in our office and was last seen 09/30/19 with Cr 3.5 prior to that visit and Cr 3.16, eGFR 22 on 09/30/19 and Hb 8.6.  Was on lasix 40 mg BID of lasix at that time.  He was referred for epo through our office.  Creatinine, Ser  Date/Time Value Ref Range Status  11/08/2019 04:45 AM 4.13 (H) 0.61 - 1.24 mg/dL Final  11/07/2019 03:18 AM 3.94 (H) 0.61 - 1.24 mg/dL Final  11/06/2019 09:35 AM 3.84 (H) 0.61 - 1.24 mg/dL Final  10/22/2019 05:05 AM 3.55 (H) 0.61 - 1.24 mg/dL Final  10/21/2019 04:54 AM 3.62 (H) 0.61 - 1.24 mg/dL Final  10/20/2019 05:03 AM 3.45 (H) 0.61 - 1.24 mg/dL Final  10/19/2019 05:53 AM 3.52 (H) 0.61  - 1.24 mg/dL Final  10/18/2019 04:42 PM 3.49 (H) 0.61 - 1.24 mg/dL Final  10/18/2019 04:54 AM 3.52 (H) 0.61 - 1.24 mg/dL Final  10/17/2019 04:47 PM 3.56 (H) 0.61 - 1.24 mg/dL Final  10/17/2019 06:03 AM 3.66 (H) 0.61 - 1.24 mg/dL Final  10/16/2019 05:13 PM 3.44 (H) 0.61 - 1.24 mg/dL Final  10/16/2019 01:07 AM 3.35 (H) 0.61 - 1.24 mg/dL Final  10/14/2019 10:59 PM 3.27 (H) 0.61 - 1.24 mg/dL Final  10/13/2019 02:48 PM 3.26 (H) 0.61 - 1.24 mg/dL Final  09/21/2019 05:02 AM 3.54 (H) 0.61 - 1.24 mg/dL Final  09/20/2019 01:54 AM 3.67 (H) 0.61 - 1.24 mg/dL Final  09/19/2019 04:18 AM 3.73 (H) 0.61 - 1.24 mg/dL Final  09/18/2019 06:10 AM 3.62 (H) 0.61 - 1.24 mg/dL Final  09/17/2019 06:04 AM 3.58 (H) 0.61 - 1.24 mg/dL Final  09/16/2019 03:55 PM 3.66 (H) 0.61 - 1.24 mg/dL Final  08/13/2019 10:28 PM 2.63 (H) 0.61 - 1.24 mg/dL Final  08/13/2019 11:39 AM 2.53 (H) 0.76 - 1.27 mg/dL Final  08/10/2019 03:10 AM 2.99 (H) 0.61 - 1.24 mg/dL Final  08/09/2019 05:45 AM 2.96 (H) 0.61 - 1.24 mg/dL Final  08/08/2019 05:30 AM 3.27 (H) 0.61 - 1.24 mg/dL Final  08/07/2019 05:46 AM 2.98 (H) 0.61 - 1.24 mg/dL Final  08/06/2019 04:50 AM 2.96 (H) 0.61 - 1.24 mg/dL Final  08/05/2019 05:01 AM 3.02 (H) 0.61 - 1.24 mg/dL Final  08/04/2019 06:12 PM 3.06 (H) 0.61 - 1.24 mg/dL Final  08/01/2019 03:32 AM 3.34 (H) 0.61 - 1.24 mg/dL Final  07/31/2019 10:19 AM 3.48 (H) 0.61 - 1.24 mg/dL Final  07/27/2019 04:04 PM 3.58 (H) 0.61 - 1.24 mg/dL Final  07/21/2019 10:10 AM 3.62 (H) 0.61 - 1.24 mg/dL Final  04/21/2019 04:10 PM 3.12 (H) 0.61 - 1.24 mg/dL Final  03/26/2019 02:20 PM 3.02 (H) 0.61 - 1.24 mg/dL Final  02/11/2019 11:21 AM 2.34 (H) 0.76 - 1.27 mg/dL Final  02/24/2018 11:42 AM 1.27 0.76 - 1.27 mg/dL Final  11/05/2017 11:26 AM 0.93 0.40 - 1.50 mg/dL Final  07/26/2017 09:49 PM 1.20 0.61 - 1.24 mg/dL Final  02/26/2017 09:42 AM 0.97 0.76 - 1.27 mg/dL Final  02/22/2017 04:47 AM 1.08 0.61 - 1.24 mg/dL Final  02/21/2017 10:54 AM  1.34 (H) 0.61 - 1.24 mg/dL Final  12/20/2016 02:33 PM 1.23 0.76 - 1.27 mg/dL Final  10/03/2016 01:22 AM 1.13 0.61 - 1.24 mg/dL Final  05/14/2016 03:52 PM 1.10 0.76 - 1.27 mg/dL Final  09/29/2015 11:13 AM 0.94 0.76 - 1.27 mg/dL Final  09/12/2015 01:56 PM 0.90 0.61 - 1.24 mg/dL Final  09/12/2015 01:47 PM 1.10 0.61 - 1.24 mg/dL Final     PMHx:   Past Medical History:  Diagnosis Date  . Anemia   . Arthritis    past hx   . Blindness    right eye  . Cataract    removed both eyes  . Dehydration   . Diabetes ([employer])   . Glaucoma   . History of CVA (cerebrovascular accident) 09/13/2015  . History of urinary retention   . Hyperlipidemia   . Hypertension   . Pernicious anemia 02/24/2018  . Stroke [facility])    2017- March  . Tachycardia 08/26/2017  . Tubular adenoma of colon 02/2017  . Weight loss, non-intentional 08/26/2017   10 lbs between 6/18 & 2/19    Past Surgical History:  Procedure Laterality Date  . CATARACT EXTRACTION, BILATERAL    . COLONOSCOPY    . IR THORACENTESIS ASP PLEURAL SPACE W/IMG GUIDE  09/21/2019  . IR THORACENTESIS ASP PLEURAL SPACE W/IMG GUIDE  10/16/2019  . POLYPECTOMY    . REFRACTIVE SURGERY  10/2017  . TEE WITHOUT CARDIOVERSION N/A 09/14/2015   Procedure: TRANSESOPHAGEAL ECHOCARDIOGRAM (TEE);  Surgeon: Larey Dresser, MD;  Location: [place];  Service: Cardiovascular;  Laterality: N/A;  . TRANSURETHRAL RESECTION OF PROSTATE N/A 10/20/2019   Procedure: TRANSURETHRAL RESECTION OF THE PROSTATE (TURP);  Surgeon: Irine Seal, MD;  Location: [facility];  Service: Urology;  Laterality: N/A;  . UPPER GASTROINTESTINAL ENDOSCOPY      Family Hx:  Family History  Problem Relation Age of Onset  . Hypertension Mother   . Hyperlipidemia Mother   . Hyperlipidemia Father   . Colon cancer Neg Hx   . Colon polyps Neg Hx   . Esophageal cancer Neg Hx   . Rectal cancer Neg Hx   . Stomach cancer Neg Hx     Social History:  reports that he has quit smoking. He quit smokeless  tobacco use about 41 years ago.  His smokeless tobacco use included chew. He reports that he does not drink alcohol or use drugs.  Allergies: No Known Allergies  Medications: Prior to Admission medications   Medication Sig Start Date End Date Taking? Authorizing Provider  aspirin EC 81 MG tablet Take 1 tablet (81 mg total) by  mouth daily. 10/22/19  Yes Eugenie Filler, MD  atorvastatin (LIPITOR) 80 MG tablet Take 1 tablet (80 mg total) by mouth at bedtime. IM program 07/22/19  Yes [name], Rylee, MD  dorzolamide-timolol (COSOPT) 22.3-6.8 MG/ML ophthalmic solution Place 1 drop into the right eye 2 (two) times daily.  01/16/18  Yes [provider]  ferrous gluconate (FERGON) 324 MG tablet Take 324 mg by mouth daily with breakfast.   Yes [provider]  finasteride (PROSCAR) 5 MG tablet Take 5 mg by mouth daily.   Yes [provider]  furosemide (LASIX) 80 MG tablet Take 1 tablet (80 mg total) by mouth daily. 10/23/19  Yes Eugenie Filler, MD  hydrALAZINE (APRESOLINE) 25 MG tablet Take 3 tablets (75 mg total) by mouth 3 (three) times daily. 11/05/19  Yes Katherine Roan, MD  isosorbide dinitrate (ISORDIL) 20 MG tablet Take 2 tablets (40 mg total) by mouth 3 (three) times daily. 11/05/19  Yes Katherine Roan, MD  metoprolol succinate (TOPROL-XL) 50 MG 24 hr tablet Take 1 tablet (50 mg total) by mouth daily. Take with or immediately following a meal. 10/23/19  Yes Eugenie Filler, MD  sodium bicarbonate 650 MG tablet Take 650 mg by mouth 2 (two) times daily.   Yes [provider]  tamsulosin (FLOMAX) 0.4 MG CAPS capsule Take 0.4 mg by mouth daily after supper.   Yes [provider]  vitamin B-12 (CYANOCOBALAMIN) 1000 MCG tablet Take 1,000 mcg by mouth daily.   Yes [provider]  ACCU-CHEK FASTCLIX LANCETS MISC Check blood sugar up to 7 times a week as instructed Patient taking differently: 1 each by Other route See admin instructions.  Check blood sugar up to 7 times a week as instructed 08/13/18   Isabelle Course, MD  Blood Glucose Monitoring Suppl (ACCU-CHEK GUIDE) w/Device KIT 1 each by Does not apply route daily. Check blood sugar as instructed up to 7 times a week Patient taking differently: 1 each by Does not apply route See admin instructions. Check blood sugar as instructed up to 7 times a week 08/13/18   Isabelle Course, MD  glucose blood (ACCU-CHEK GUIDE) test strip Check blood sugar up to 7 times a week as instructed Patient taking differently: 1 each by Other route See admin instructions. Check blood sugar up to 7 times a week as instructed 08/13/18   Isabelle Course, MD    I have reviewed the patient's current and reported prior to admission medications.  Labs:  BMP Latest Ref Rng & Units 11/08/2019 11/07/2019 11/06/2019  Glucose 70 - 99 mg/dL 152(H) 126(H) 173(H)  BUN 8 - 23 mg/dL 59(H) 54(H) 55(H)  Creatinine 0.61 - 1.24 mg/dL 4.13(H) 3.94(H) 3.84(H)  BUN/Creat Ratio 10 - 24 - - -  Sodium 135 - 145 mmol/L 138 137 137  Potassium 3.5 - 5.1 mmol/L 4.1 4.1 4.3  Chloride 98 - 111 mmol/L 109 106 106  CO2 22 - 32 mmol/L 20(L) 20(L) 20(L)  Calcium 8.9 - 10.3 mg/dL 8.6(L) 9.0 8.8(L)    Urinalysis    Component Value Date/Time   COLORURINE YELLOW 11/07/2019 1316   APPEARANCEUR CLOUDY (A) 11/07/2019 1316   LABSPEC 1.015 11/07/2019 1316   PHURINE 5.0 11/07/2019 1316   GLUCOSEU 50 (A) 11/07/2019 1316   HGBUR LARGE (A) 11/07/2019 1316   BILIRUBINUR NEGATIVE 11/07/2019 [place] 11/07/2019 1316   PROTEINUR >=300 (A) 11/07/2019 1316   NITRITE NEGATIVE 11/07/2019 1316   LEUKOCYTESUR  MODERATE (A) 11/07/2019 1316     ROS:  Pertinent items noted in HPI and remainder of comprehensive ROS otherwise negative. however do note that difficult to obtain given trouble with patient recall   Physical Exam: Vitals:   11/08/19 0800 11/08/19 1034  BP:  136/66  Pulse: 76 62  Resp:    Temp:  99.7 F (37.6 C)  SpO2:  94% 97%     General: adult male in bed in NAD HEENT: NCAT Neck: supple trachea midline Heart: S1S2 no rub Lungs: clear to auscultation normal work of breathing bilaterally Abdomen: soft/nt/nd Extremities: no edema appreciated; no cyanosis Skin: no rash on extremities exposed Neuro: patient is conversant; cannot recall details of some recent events Psych calm mood and affect GU: condom cath in place   Assessment/Plan:  # Acute kidney injury - Pre-renal insults with lasix vs. CKD progression.  There is concern for anticipated further progression of CKD with heart catheterization - He has questions about his heart catheterization and asked that he direct these to cardiology and would suggest his healthcare decision maker be present as well  - Pause lasix for now  - Gentle normal saline at 75 ml/hr x 12 hours then stop and reassess  # CKD stage IV -Baseline creatinine variable however his GFR in the [facility] system has been less than 30 since 03/26/2019  # Syncope - Catheterization per cardiology discretion  # Combined systolic and diastolic CHF - no evidence of overload on my exam  - Pause lasix for now as above    # HTN  - improved today with 136/66 last check   # Normocytic anemia  - with some component of iron deficiency  - oral iron - may be 2/2 CKD in part as well   - hx referral for ESA as an outpatient  # BPH  - on finasteride, and flomax and has seen urology - check post-void bladder scan and in/out cath if over 128 mL   # Metabolic acidosis  - Continue sodium bicarbonate   Claudia Desanctis 11/08/2019, 4:19 PM

## 2019-11-08 NOTE — Progress Notes (Signed)
Pharmacist Heart Failure Core Measure Documentation  Assessment: Dennis Macias has an EF documented as 40-45% on 10/15/19 by Echo.  Rationale: Heart failure patients with left ventricular systolic dysfunction (LVSD) and an EF < 40% should be prescribed an angiotensin converting enzyme inhibitor (ACEI) or angiotensin receptor blocker (ARB) at discharge unless a contraindication is documented in the medical record.  This patient is not currently on an ACEI or ARB for HF.  This note is being placed in the record in order to provide documentation that a contraindication to the use of these agents is present for this encounter.  ACE Inhibitor or Angiotensin Receptor Blocker is contraindicated (specify all that apply)  []   ACEI allergy AND ARB allergy []   Angioedema []   Moderate or severe aortic stenosis []   Hyperkalemia []   Hypotension []   Renal artery stenosis [x]   Worsening renal function, preexisting renal disease or dysfunction   Kamariah Fruchter L Kateryn Marasigan 11/08/2019 11:30 AM

## 2019-11-08 NOTE — Progress Notes (Signed)
Bladder scan = 400. Pt has condom cath and does not know when he needs to urinate. Unable to get accurate PVR. Dr Carolin Sicks notified. Order received to Bladder scan again in two hours and in out cath if volume > 500 ml.

## 2019-11-08 NOTE — Progress Notes (Signed)
Pharmacy Antibiotic Note  Dennis Macias is a 69 y.o. male admitted on 11/06/2019 with UTI.  Pharmacy has been consulted for cefepime dosing.  UA positive (WBC>50, mod leukocytes, rare bacteria). Has indwelling catheter. UCx showing pseudomonas aeruginosa. WBC WNL, temp 101.7 within last 24 hours. Scr trending up from 3.84 to 4.13 (CrCl 18 mL/min).   Plan: Cefepime 2g IV every 24 hours  Monitor renal fx, cx results, clinical pic  Height: 6' (182.9 cm) Weight: 80 kg (176 lb 5.9 oz) IBW/kg (Calculated) : 77.6  Temp (24hrs), Avg:99.6 F (37.6 C), Min:98.5 F (36.9 C), Max:101.7 F (38.7 C)  Recent Labs  Lab 11/06/19 0935 11/07/19 0318 11/08/19 0445  WBC 13.2* 8.7 8.2  CREATININE 3.84* 3.94* 4.13*    Estimated Creatinine Clearance: 18.8 mL/min (A) (by C-G formula based on SCr of 4.13 mg/dL (H)).    No Known Allergies  Antimicrobials this admission: Ceftriaxone 5/2 x1 Cefepime 5/2 >>   Dose adjustments this admission: N/A   Microbiology results: 5/1 BCx: ngtd 5/1 UCx: >100k pseudomonas aeruginosa    Thank you for allowing pharmacy to be a part of this patient's care.  Antonietta Jewel, PharmD, [facility] Clinical Pharmacist  Phone: [phone] 11/08/2019 12:53 PM  Please check AMION for all [place] phone numbers After 10:00 PM, call [facility] [phone]

## 2019-11-09 DIAGNOSIS — I214 Non-ST elevation (NSTEMI) myocardial infarction: Secondary | ICD-10-CM | POA: Diagnosis not present

## 2019-11-09 DIAGNOSIS — I10 Essential (primary) hypertension: Secondary | ICD-10-CM

## 2019-11-09 DIAGNOSIS — I5042 Chronic combined systolic (congestive) and diastolic (congestive) heart failure: Secondary | ICD-10-CM

## 2019-11-09 DIAGNOSIS — R55 Syncope and collapse: Secondary | ICD-10-CM | POA: Diagnosis not present

## 2019-11-09 DIAGNOSIS — N184 Chronic kidney disease, stage 4 (severe): Secondary | ICD-10-CM

## 2019-11-09 LAB — GLUCOSE, CAPILLARY
Glucose-Capillary: 98 mg/dL (ref 70–99)
Glucose-Capillary: 93 mg/dL (ref 70–99)
Glucose-Capillary: 103 mg/dL — ABNORMAL HIGH (ref 70–99)
Glucose-Capillary: 130 mg/dL — ABNORMAL HIGH (ref 70–99)

## 2019-11-09 LAB — BASIC METABOLIC PANEL
Anion gap: 11 (ref 5–15)
BUN: 59 mg/dL — ABNORMAL HIGH (ref 8–23)
CO2: 21 mmol/L — ABNORMAL LOW (ref 22–32)
Calcium: 8.5 mg/dL — ABNORMAL LOW (ref 8.9–10.3)
Chloride: 110 mmol/L (ref 98–111)
Creatinine, Ser: 4.34 mg/dL — ABNORMAL HIGH (ref 0.61–1.24)
GFR calc Af Amer: 15 mL/min — ABNORMAL LOW (ref >60)
GFR calc non Af Amer: 13 mL/min — ABNORMAL LOW (ref >60)
Glucose, Bld: 95 mg/dL (ref 70–99)
Potassium: 3.6 mmol/L (ref 3.5–5.1)
Sodium: 142 mmol/L (ref 135–145)

## 2019-11-09 LAB — URINE CULTURE: Culture: >=100000 — AB

## 2019-11-09 MED ORDER — CARVEDILOL 6.25 MG PO TABS
6.2500 mg | ORAL_TABLET | Freq: Two times a day (BID) | ORAL | Status: DC
  Administered 2019-11-09 – 2019-11-13 (×6): 6.25 mg via ORAL
  Filled 2019-11-09 (×6): qty 1

## 2019-11-09 MED ORDER — SODIUM CHLORIDE 0.9 % IV SOLN: INTRAVENOUS | Status: DC

## 2019-11-09 NOTE — Progress Notes (Signed)
Subjective: [name] was seen at beside this AM. The patient does not endorse any specific complaints today. He is afebrile and denies symptoms such as chills, dysuria, CP, or SOB.   Objective:  Vital signs in last 24 hours: Vitals:   11/09/19 0029 11/09/19 0528 11/09/19 0738 11/09/19 1054  BP: 136/62 (!) 151/63 (!) 169/74 (!) 165/70  Pulse: 76 64    Resp: 18 20 13    Temp: 100.3 F (37.9 C) 98.7 F (37.1 C) 98.4 F (36.9 C)   TempSrc: Oral Oral Oral   SpO2: 94% 97%    Weight:  80 kg    Height:       General: Elderly male, NAD, laying in bed Cardiac: RRR, no m/r/g Pulmonary: CTABL, no wheezing, rhonchi, rales Abdomen: Soft, non-tender, non-distended Extremities: No LE edema  Assessment/Plan:  Dennis Macias is a 69 y.o. male with a history of CAD chronic systolic and diastolic heart failure, CKD stage IV, anemia of chronic disease, and BPH with chronic indwelling Foley catheter status post TURP who presented with a syncopal episode that occurred on 11/06/19.   Active Problems:   Syncope  Syncope: Patient had an episode of syncope that occurred during breakfast on 04/30; no presyncopal times or seizure activity is noted. Reported increase in his home BP medications on 04/29 and he had just taken his medications prior to the episode. Labs were significant for troponin elevation of 1600 > 2300 > 2200. Differential includes arrhythmia; cardiology is on board and planning catheterization although his renal function makes this challenging given risk of contrast nephropathy.  -Cardiology following, appreciate recommendations -Cardiology to proceed with left heart cath, but will hold off for now given recently elevated temp and rising Scr -Continue telemetry -Continue home BP medications  Hypertension: -Patient recently had his hydralazine and isosorbide medications increased at home before his syncopal episode. However, the patient remains hypertensive, and we will continue his home  medications for now. -Hydralazine 100 mg PO TID   Chronic systolic and diastolic heart failure: BNP on admission elevated to>4000. Was on Lasix initially, but this was recently discontinued given climbing Scr levels. He is euvolemic on examination today and had adequate urine output of 1495 mL in the past 24 hours. Last recorded dry weight was 80.9 kg in Feb 2021, although his weight was recorded at 70.3 kg on 04/30, 80 kg today on 05/02, and 80 kg today. Will continue to monitor his urine output and weight changes during his admission. -Holding home Lasix -Strict I's/O's -Daily weights  CKD stage IV: Creatinine on admission was 3.84, near his baseline. Creatinine has been slowly rising 3.84>>3.94>>4.12>>4.34 today. Nephrology is currently on board. His rising creatinine could be the result of pre-renal (prior Lasix regimen vs. CKD progression 2/2 DM) and post-renal AKI 2/2 BPH. Yesterday, bladder scan showed 400 mL but an accurate PVR was unable to be obtained. Will plan on repeat bladder scan and PVR today. -Nephrology consulted; appreciate their recs -PVR pending -IV NS at 47ml/hr per nephrology; will trend UOP and Scr -Sodium bicarbonate  UA showing pyuria; urine cultures positive for Pseudomonas Over the weekend, the patient developed fevers, chills, and some diaphoresis. He is otherwise asymptomatic and is currently afebrile. The exact etiology is unclear at this time, though his indwelling catheter could be a potential source of infection. Additionally, the patient is recently s/p TURP, so this could represent acute bacterial prostatitis as a complication of his surgery. He is on day 2 of cefepime regimen for  urine cultures positive for cefepime-sensitive Pseudomonas species. Will continue management, but we will consider reculture with clean cath pending management of CKD as above. -Continue cefepime regimen, currently on day 2 of 7-10. -Tylenol for fevers   Anemia of chronic  disease: -Continue ferrous gluconate -Iron studies pending; can consider initiation of ESA based on these results  Prior to Admission Living Arrangement: Home Anticipated Discharge Location: TBD Barriers to Discharge: Pending further evalaution Dispo: Anticipated discharge in approximately 1-2 day(s).   Orvis Brill, Medical Student 11/09/2019, 10:58 AM Pager: [phone]

## 2019-11-09 NOTE — Telephone Encounter (Signed)
Okay; thanks.

## 2019-11-09 NOTE — Telephone Encounter (Signed)
Just an Delena Serve with [facility] messaged me and informed me that the patient did not show to his appointment because he is in the hospital.

## 2019-11-09 NOTE — Progress Notes (Signed)
Internal Medicine Clinic Attending  Case discussed with Dr. Winfrey  at the time of the visit.  We reviewed the resident's history and exam and pertinent patient test results.  I agree with the assessment, diagnosis, and plan of care documented in the resident's note.  

## 2019-11-09 NOTE — Progress Notes (Signed)
Patient ID: Jasmond River, male   DOB: 1951/04/12, 69 y.o.   MRN: [id number] S: no events overnight.  No new complaints. O:BP (!) 169/74 (BP Location: Left Arm)   Pulse 64   Temp 98.4 F (36.9 C) (Oral)   Resp 13   Ht 6' (1.829 m)   Wt 80 kg   SpO2 97%   BMI 23.92 kg/m   Intake/Output Summary (Last 24 hours) at 11/09/2019 1040 Last data filed at 11/09/2019 0550 Gross per 24 hour  Intake 90 ml  Output 1495 ml  Net -1405 ml   Intake/Output: I/O last 3 completed shifts: In: 64 [IV Piggyback:90] Out: 1194 [Urine:1495]  Intake/Output this shift:  No intake/output data recorded. Weight change: 0 kg Gen: NAD CVS: no rub Resp: cta Abd: +BS, soft, NT/ND Ext: no edema  Recent Labs  Lab 11/06/19 0935 11/07/19 0318 11/08/19 0445 11/09/19 0403  NA 137 137 138 142  K 4.3 4.1 4.1 3.6  CL 106 106 109 110  CO2 20* 20* 20* 21*  GLUCOSE 173* 126* 152* 95  BUN 55* 54* 59* 59*  CREATININE 3.84* 3.94* 4.13* 4.34*  CALCIUM 8.8* 9.0 8.6* 8.5*   Liver Function Tests: No results for input(s): AST, ALT, ALKPHOS, BILITOT, PROT, ALBUMIN in the last 168 hours. No results for input(s): LIPASE, AMYLASE in the last 168 hours. No results for input(s): AMMONIA in the last 168 hours. CBC: Recent Labs  Lab 11/06/19 0935 11/07/19 0318 11/08/19 0445  WBC 13.2* 8.7 8.2  NEUTROABS 11.5*  --   --   HGB 8.5* 8.4* 8.1*  HCT 25.7* 24.8* 24.5*  MCV 91.5 88.6 90.4  PLT 214 196 193   Cardiac Enzymes: No results for input(s): CKTOTAL, CKMB, CKMBINDEX, TROPONINI in the last 168 hours. CBG: Recent Labs  Lab 11/08/19 0729 11/08/19 1205 11/08/19 1721 11/08/19 2050 11/09/19 0726  GLUCAP 134* 99 110* 119* 98    Iron Studies: No results for input(s): IRON, TIBC, TRANSFERRIN, FERRITIN in the last 72 hours. Studies/Results: DG Chest 2 View  Result Date: 11/07/2019 CLINICAL DATA:  Syncope EXAM: CHEST - 2 VIEW COMPARISON:  10/19/2019 FINDINGS: Cardiomegaly. New small to moderate bilateral pleural  effusions and associated atelectasis or consolidation. The visualized skeletal structures are unremarkable. IMPRESSION: 1.  Cardiomegaly. 2. New small to moderate bilateral pleural effusions and associated atelectasis or consolidation. Electronically Signed   By: Eddie Candle M.D.   On: 11/07/2019 17:20   . aspirin EC  81 mg Oral Daily  . atorvastatin  80 mg Oral QHS  . dorzolamide-timolol  1 drop Right Eye BID  . ferrous gluconate  324 mg Oral Q breakfast  . finasteride  5 mg Oral Daily  . heparin  5,000 Units Subcutaneous Q8H  . hydrALAZINE  100 mg Oral TID  . insulin aspart  0-9 Units Subcutaneous TID WC  . isosorbide dinitrate  40 mg Oral TID  . metoprolol succinate  50 mg Oral Daily  . sodium bicarbonate  650 mg Oral BID  . tamsulosin  0.4 mg Oral QPC supper  . vitamin B-12  1,000 mcg Oral Daily    BMET    Component Value Date/Time   NA 142 11/09/2019 0403   NA 140 08/13/2019 1139   K 3.6 11/09/2019 0403   CL 110 11/09/2019 0403   CO2 21 (L) 11/09/2019 0403   GLUCOSE 95 11/09/2019 0403   BUN 59 (H) 11/09/2019 0403   BUN 36 (H) 08/13/2019 1139   CREATININE 4.34 (H) 11/09/2019  0403   CALCIUM 8.5 (L) 11/09/2019 0403   GFRNONAA 13 (L) 11/09/2019 0403   GFRAA 15 (L) 11/09/2019 0403   CBC    Component Value Date/Time   WBC 8.2 11/08/2019 0445   RBC 2.71 (L) 11/08/2019 0445   HGB 8.1 (L) 11/08/2019 0445   HGB 7.7 (L) 08/13/2019 1139   HCT 24.5 (L) 11/08/2019 0445   HCT 22.3 (L) 08/13/2019 1139   PLT 193 11/08/2019 0445   PLT 392 08/13/2019 1139   MCV 90.4 11/08/2019 0445   MCV 86 08/13/2019 1139   MCH 29.9 11/08/2019 0445   MCHC 33.1 11/08/2019 0445   RDW 13.3 11/08/2019 0445   RDW 14.4 08/13/2019 1139   LYMPHSABS 1.1 11/06/2019 0935   LYMPHSABS 2.0 02/24/2018 1142   MONOABS 0.6 11/06/2019 0935   EOSABS 0.0 11/06/2019 0935   EOSABS 0.1 02/24/2018 1142   BASOSABS 0.0 11/06/2019 0935   BASOSABS 0.0 02/24/2018 1142     Assessment/Plan:  1. AKI/CKD stage 4-  following a syncopal episode.    2. Syncope/NSTEMI- possible arrythmia. Cardiology consulted and wants to proceed with cardiac cath, however Scr climbing since admission. 1. Hold off on LHC for now given elevated temp and rising Scr 2. Start IV NS at 68ml/hr and follow UOP and Scr 3. Need to minimize contrast exposure and hold diuretics prior to [facility] and cont with gentle IVF's pre and post cath. 3. CKD stage 4- presumably due to DM and followed by Dr. Hollie Salk in our office and baseline Scr 3.5.   4. HTN- increased hydralazine but BP remains elevated.  Cont to follow 5. Fever- Tmax 100.5 last night.  Afebrile today. Urine culture + for pseudomonas aeruginosa sensitive to cefepime and ceftaz, cipro.  Started on cefepime and follow.  6. Anemia of CKD- check iron stores and likely start [place], MD [facility] [phone]

## 2019-11-09 NOTE — Progress Notes (Addendum)
Progress Note  Patient Name: Dennis Macias Date of Encounter: 11/09/2019  Primary Cardiologist: Larae Grooms, MD   Subjective   No chest pain and no SOB   Inpatient Medications    Scheduled Meds: . aspirin EC  81 mg Oral Daily  . atorvastatin  80 mg Oral QHS  . dorzolamide-timolol  1 drop Right Eye BID  . ferrous gluconate  324 mg Oral Q breakfast  . finasteride  5 mg Oral Daily  . heparin  5,000 Units Subcutaneous Q8H  . hydrALAZINE  100 mg Oral TID  . insulin aspart  0-9 Units Subcutaneous TID WC  . isosorbide dinitrate  40 mg Oral TID  . metoprolol succinate  50 mg Oral Daily  . sodium bicarbonate  650 mg Oral BID  . tamsulosin  0.4 mg Oral QPC supper  . vitamin B-12  1,000 mcg Oral Daily   Continuous Infusions: . ceFEPime (MAXIPIME) IV 2 g (11/08/19 1447)   PRN Meds: acetaminophen **OR** acetaminophen   Vital Signs    Vitals:   11/08/19 2334 11/09/19 0029 11/09/19 0528 11/09/19 0738  BP: (!) 145/61 136/62 (!) 151/63 (!) 169/74  Pulse:  76 64   Resp:  18 20 13   Temp:  100.3 F (37.9 C) 98.7 F (37.1 C) 98.4 F (36.9 C)  TempSrc:  Oral Oral Oral  SpO2:  94% 97%   Weight:   80 kg   Height:        Intake/Output Summary (Last 24 hours) at 11/09/2019 0900 Last data filed at 11/09/2019 0550 Gross per 24 hour  Intake 90 ml  Output 1495 ml  Net -1405 ml   Last 3 Weights 11/09/2019 11/08/2019 11/06/2019  Weight (lbs) 176 lb 5.9 oz 176 lb 5.9 oz 155 lb  Weight (kg) 80 kg 80 kg 70.308 kg      Telemetry    SR with NSVT short bursts  - Personally Reviewed  ECG    No new - Personally Reviewed  Physical Exam   GEN: No acute distress.   Neck: No JVD Cardiac: RRR, no murmurs, rubs, or gallops.  Respiratory: Clear to auscultation bilaterally. GI: Soft, nontender, non-distended  MS: No edema; No deformity. Neuro:  Nonfocal  Psych: Normal affect   Labs    High Sensitivity Troponin:   Recent Labs  Lab 10/14/19 2259 10/15/19 0052 11/06/19 0935 11/06/19  1924 11/06/19 2235  TROPONINIHS 20* 19* 1,618* 2,384* 2,233*      Chemistry Recent Labs  Lab 11/07/19 0318 11/08/19 0445 11/09/19 0403  NA 137 138 142  K 4.1 4.1 3.6  CL 106 109 110  CO2 20* 20* 21*  GLUCOSE 126* 152* 95  BUN 54* 59* 59*  CREATININE 3.94* 4.13* 4.34*  CALCIUM 9.0 8.6* 8.5*  GFRNONAA 15* 14* 13*  GFRAA 17* 16* 15*  ANIONGAP 11 9 11      Hematology Recent Labs  Lab 11/06/19 0935 11/07/19 0318 11/08/19 0445  WBC 13.2* 8.7 8.2  RBC 2.81* 2.80* 2.71*  HGB 8.5* 8.4* 8.1*  HCT 25.7* 24.8* 24.5*  MCV 91.5 88.6 90.4  MCH 30.2 30.0 29.9  MCHC 33.1 33.9 33.1  RDW 13.4 13.2 13.3  PLT 214 196 193    BNP Recent Labs  Lab 11/06/19 0935  BNP 4,075.9*     DDimer No results for input(s): DDIMER in the last 168 hours.   Radiology    DG Chest 2 View  Result Date: 11/07/2019 CLINICAL DATA:  Syncope EXAM: CHEST - 2 VIEW  COMPARISON:  10/19/2019 FINDINGS: Cardiomegaly. New small to moderate bilateral pleural effusions and associated atelectasis or consolidation. The visualized skeletal structures are unremarkable. IMPRESSION: 1.  Cardiomegaly. 2. New small to moderate bilateral pleural effusions and associated atelectasis or consolidation. Electronically Signed   By: Eddie Candle M.D.   On: 11/07/2019 17:20    Cardiac Studies   Echo 10/15/19 IMPRESSIONS    1. Left ventricular ejection fraction, by estimation, is 40 to 45%. The  left ventricle has mildly decreased function. The left ventricle  demonstrates regional wall motion abnormalities (see scoring  diagram/findings for description). There is mild  asymmetric left ventricular hypertrophy of the septal segment. Left  ventricular diastolic parameters are consistent with Grade I diastolic  dysfunction (impaired relaxation).  2. Right ventricular systolic function is normal. The right ventricular  size is normal. There is mildly elevated pulmonary artery systolic  pressure.  3. Left atrial size was  severely dilated.  4. Moderate pleural effusion.  5. The mitral valve is normal in structure. Mild mitral valve  regurgitation. No evidence of mitral stenosis.  6. The aortic valve is tricuspid. Aortic valve regurgitation is not  visualized. Mild to moderate aortic valve sclerosis/calcification is  present, without any evidence of aortic stenosis.  7. The inferior vena cava is normal in size with greater than 50%  respiratory variability, suggesting right atrial pressure of 3 mmHg.   Comparison(s): Changes from prior study are noted. The left ventricular  function is worsened.   FINDINGS  Left Ventricle: Left ventricular ejection fraction, by estimation, is 40  to 45%. The left ventricle has mildly decreased function. The left  ventricle demonstrates regional wall motion abnormalities. The left  ventricular internal cavity size was normal  in size. There is mild asymmetric left ventricular hypertrophy of the  septal segment. Left ventricular diastolic parameters are consistent with  Grade I diastolic dysfunction (impaired relaxation).     LV Wall Scoring:  The entire apex is akinetic.   Right Ventricle: The right ventricular size is normal. No increase in  right ventricular wall thickness. Right ventricular systolic function is  normal. There is mildly elevated pulmonary artery systolic pressure. The  tricuspid regurgitant velocity is 2.95  m/s, and with an assumed right atrial pressure of 3 mmHg, the estimated  right ventricular systolic pressure is 82.5 mmHg.   Left Atrium: Left atrial size was severely dilated.   Right Atrium: Right atrial size was normal in size.   Pericardium: Trivial pericardial effusion is present.   Mitral Valve: The mitral valve is normal in structure. Normal mobility of  the mitral valve leaflets. Mild mitral valve regurgitation. No evidence of  mitral valve stenosis.   Tricuspid Valve: The tricuspid valve is normal in structure. Tricuspid   valve regurgitation is not demonstrated. No evidence of tricuspid  stenosis.   Aortic Valve: The aortic valve is tricuspid. Aortic valve regurgitation is  not visualized. Mild to moderate aortic valve sclerosis/calcification is  present, without any evidence of aortic stenosis.   Patient Profile     69 y.o. male with past medical history of coronary artery disease, ischemic cardiomyopathy, hypertension, hyperlipidemia, chronic stage IV kidney disease, chronic indwelling Foley catheter, history of CVA for evaluation of syncope.  Echocardiogram April 2021 showed ejection fraction 40 to 05%, grade 1 diastolic dysfunction, severe left atrial enlargement, mild mitral regurgitation.  Nuclear study April 2021 showed ejection fraction 40%.  There was apical infarction with peri-infarct ischemia.  Patient has been treated medically due to  renal insufficiency and lack of symptoms.   Assessment & Plan    1 syncope-etiology unclear/NSTEMI.  (has had freq admits, Feb for nephrotic syndrome, and March with anasarca and Rt thoracentesis both in March and Feb) However description is concerning for possible arrhythmia.  Per notes he had nonsustained ventricular tachycardia and PVCs on telemetry.  His troponin is also elevated compared to previous though in the setting of renal insufficiency.  He has reduced LV function and also had ischemia on his recent nuclear study.  His BB had also been increased recently.   I wonder if he may have had ischemia mediated ventricular arrhythmia.  I have discussed the possibility of cardiac catheterization for definitive evaluation of his coronaries.  I explained the risks of contrast nephropathy including dialysis.  He is agreeable to proceed.  We will plan catheterization after his fever resolves.  Troponin 1618 with no EKG changes,  Pt is NPO but not on cath board.  --hs Troponin 2384 at pk   2 chronic stage IV kidney disease- nephrology saw yesterday as he may require dialysis  following catheterization.  Lasix held and IV fluids at 75 cc hr for 12 hours.  today Cr 4.34 up from 4.13 yesterday and was 3.84 on admit.    3 ischemic cardiomyopathy-continue beta-blocker, hydralazine and nitrates. With BNP 4075 on admit.  He is neg 2265 cc, though wt is up from 70.3 Kg on admit to 80- kg now, doubt accuracy  4 hypertension-blood pressure elevated.  Increase hydralazine to 100 mg p.o. 3 times daily and follow.  Low yesterday 136/62 and today 169/74 at 0700 prior to meds.    5 fever-etiology unclear.  Further work-up per primary service. Fever at 2100 yesterday of 100.5 and at MN none currently  6. Anemia with Hgb 8.4 per IM. Was seeing nephrology as outpt for epogen.   7.  Urinary retention, foley removed last week.  Now with condom cath.    Talked with Dr. Marval Regal and no cath for today , we do not believe he rec'd IV fluids so will begin today.  If Cr improved and no fever possible cath tomorrow  For questions or updates, please contact [place] Please consult [email or web address] for contact info under        Signed, Cecilie Kicks, NP  11/09/2019, 9:00 AM

## 2019-11-10 ENCOUNTER — Encounter (HOSPITAL_COMMUNITY): Payer: Self-pay | Admitting: Internal Medicine

## 2019-11-10 DIAGNOSIS — I214 Non-ST elevation (NSTEMI) myocardial infarction: Secondary | ICD-10-CM | POA: Diagnosis not present

## 2019-11-10 DIAGNOSIS — I5042 Chronic combined systolic (congestive) and diastolic (congestive) heart failure: Secondary | ICD-10-CM | POA: Diagnosis not present

## 2019-11-10 DIAGNOSIS — R55 Syncope and collapse: Secondary | ICD-10-CM | POA: Diagnosis not present

## 2019-11-10 LAB — BLOOD CULTURE ID PANEL (REFLEXED)

## 2019-11-10 LAB — CBC
HCT: 25.1 % — ABNORMAL LOW (ref 39.0–52.0)
Hemoglobin: 8.2 g/dL — ABNORMAL LOW (ref 13.0–17.0)
MCH: 29.9 pg (ref 26.0–34.0)
MCHC: 32.7 g/dL (ref 30.0–36.0)
MCV: 91.6 fL (ref 80.0–100.0)
Platelets: 210 10*3/uL (ref 150–400)
RBC: 2.74 MIL/uL — ABNORMAL LOW (ref 4.22–5.81)
RDW: 13.2 % (ref 11.5–15.5)
WBC: 5.1 10*3/uL (ref 4.0–10.5)
nRBC: 0 % (ref 0.0–0.2)

## 2019-11-10 LAB — RENAL FUNCTION PANEL
Albumin: 1.9 g/dL — ABNORMAL LOW (ref 3.5–5.0)
Anion gap: 8 (ref 5–15)
BUN: 60 mg/dL — ABNORMAL HIGH (ref 8–23)
CO2: 19 mmol/L — ABNORMAL LOW (ref 22–32)
Calcium: 8.2 mg/dL — ABNORMAL LOW (ref 8.9–10.3)
Chloride: 111 mmol/L (ref 98–111)
Creatinine, Ser: 4.18 mg/dL — ABNORMAL HIGH (ref 0.61–1.24)
GFR calc Af Amer: 16 mL/min — ABNORMAL LOW (ref >60)
GFR calc non Af Amer: 14 mL/min — ABNORMAL LOW (ref >60)
Glucose, Bld: 118 mg/dL — ABNORMAL HIGH (ref 70–99)
Phosphorus: 4.5 mg/dL (ref 2.5–4.6)
Potassium: 3.4 mmol/L — ABNORMAL LOW (ref 3.5–5.1)
Sodium: 138 mmol/L (ref 135–145)

## 2019-11-10 LAB — IRON AND TIBC
Iron: 34 ug/dL — ABNORMAL LOW (ref 45–182)
Saturation Ratios: 25 % (ref 17.9–39.5)
TIBC: 136 ug/dL — ABNORMAL LOW (ref 250–450)
UIBC: 102 ug/dL

## 2019-11-10 LAB — GLUCOSE, CAPILLARY
Glucose-Capillary: 95 mg/dL (ref 70–99)
Glucose-Capillary: 116 mg/dL — ABNORMAL HIGH (ref 70–99)
Glucose-Capillary: 108 mg/dL — ABNORMAL HIGH (ref 70–99)
Glucose-Capillary: 102 mg/dL — ABNORMAL HIGH (ref 70–99)

## 2019-11-10 LAB — FERRITIN: Ferritin: 282 ng/mL (ref 24–336)

## 2019-11-10 MED ORDER — DARBEPOETIN ALFA 40 MCG/0.4ML IJ SOSY
40.0000 ug | PREFILLED_SYRINGE | INTRAMUSCULAR | Status: DC
  Administered 2019-11-10 – 2019-11-17 (×2): 40 ug via SUBCUTANEOUS
  Filled 2019-11-10 (×2): qty 0.4

## 2019-11-10 MED ORDER — POTASSIUM CHLORIDE CRYS ER 20 MEQ PO TBCR
30.0000 meq | EXTENDED_RELEASE_TABLET | Freq: Two times a day (BID) | ORAL | Status: AC
  Administered 2019-11-10 (×2): 30 meq via ORAL
  Filled 2019-11-10 (×2): qty 1

## 2019-11-10 MED ORDER — CLONIDINE HCL 0.1 MG PO TABS
0.1000 mg | ORAL_TABLET | Freq: Two times a day (BID) | ORAL | Status: DC
  Administered 2019-11-10 – 2019-11-11 (×4): 0.1 mg via ORAL
  Filled 2019-11-10 (×4): qty 1

## 2019-11-10 NOTE — Evaluation (Signed)
Physical Therapy Evaluation Patient Details Name: Dennis Macias MRN: [id number] DOB: [birth year] Today's Date: 11/10/2019   History of Present Illness  69 y.o. male with a PMHx of CAD, ischemic cardiomyopathy, chronic systolic and diastolic heart failure, CKD stage IV, anemia of chronic disease, and BPH with chronic indwelling foley catheter s/p TURP 10/20/19. Came to ED after syncopal episode found to be hypertensive and febrile. Admitted 11/06/19. Plan for cardiac cath however creatinine levels elevated.   Clinical Impression  PTA pt living with girlfriend in apartment with 6 steps to enter. Pt reports independence with ambulation using cane or RW as needed for limited community ambulation, pt independent in ADLs, girlfriend assists with iADLs. Pt is limited in safe mobility by weakness and decreased activity tolerance. Pt is mod I for bed mobility, min guard for transfers and min A for steadying using quad cane for ambulation of 20 feet. PT recommending HHPT for improved safety with mobility. PT will continue to follow acutely.      Follow Up Recommendations Home health PT    Equipment Recommendations  None recommended by PT;Other (comment)(pt has equipment)       Precautions / Restrictions Precautions Precautions: Fall Restrictions Weight Bearing Restrictions: No      Mobility  Bed Mobility Overal bed mobility: Modified Independent             General bed mobility comments: increased time and effort with use of bedrail   Transfers Overall transfer level: Needs assistance Equipment used: Quad cane Transfers: Sit to/from Stand Sit to Stand: Min guard         General transfer comment: good power up and steadying with quad cane  Ambulation/Gait Ambulation/Gait assistance: Min assist Gait Distance (Feet): 20 Feet Assistive device: Quad cane Gait Pattern/deviations: Step-through pattern;Decreased step length - right;Decreased step length - left;Shuffle;Trunk flexed Gait  velocity: slowed Gait velocity interpretation: 1.31 - 2.62 ft/sec, indicative of limited community ambulator General Gait Details: slow, mildly unsteady gait, reaching out for bed rail and sink with ambulation to door and back, reports he was walking better before he came to hospital     Balance Overall balance assessment: Needs assistance Sitting-balance support: Feet supported Sitting balance-Leahy Scale: Good     Standing balance support: During functional activity;Single extremity supported Standing balance-Leahy Scale: Poor Standing balance comment: requires UE support for balance                             Pertinent Vitals/Pain Pain Assessment: No/denies pain    Home Living Family/patient expects to be discharged to:: Private residence Living Arrangements: Spouse/significant other Available Help at Discharge: Family Type of Home: Apartment Home Access: Stairs to enter Entrance Stairs-Rails: Left Entrance Stairs-Number of Steps: 6 Home Layout: One level;Able to live on main level with bedroom/bathroom Home Equipment: Bedside commode;Wheelchair - manual;Cane - Set designer - 2 wheels Additional Comments: girlfriend works, his children live near by and can come help him PRN    Prior Function Level of Independence: Needs assistance   Gait / Transfers Assistance Needed: usually ambulates with walker or cane, limited community distances  ADL's / Homemaking Assistance Needed: independent with dressing, and sponge baths, girlfriend does cooking and cleaning           Extremity/Trunk Assessment   Upper Extremity Assessment Upper Extremity Assessment: Generalized weakness    Lower Extremity Assessment Lower Extremity Assessment: Generalized weakness       Communication   Communication: [facility]  Cognition Arousal/Alertness: Awake/alert Behavior During Therapy: WFL for tasks assessed/performed Overall Cognitive Status: Within Functional Limits for tasks  assessed                                        General Comments General comments (skin integrity, edema, etc.): Pt on RA with SaO2 >90%O2 at rest with ambulation level dropped but there was a poor pleth waveform and SaO2 rebounded to 90%O2 with sitting, HR max 90 bpm with ambulation         Assessment/Plan    PT Assessment Patient needs continued PT services  PT Problem List Decreased strength;Decreased activity tolerance;Decreased balance;Decreased mobility;Decreased coordination       PT Treatment Interventions DME instruction;Gait training;Stair training;Functional mobility training;Therapeutic activities;Therapeutic exercise;Balance training    PT Goals (Current goals can be found in the Care Plan section)  Acute Rehab PT Goals Patient Stated Goal: to move better PT Goal Formulation: With patient Time For Goal Achievement: 11/24/19 Potential to Achieve Goals: Good    Frequency Min 3X/week    AM-PAC PT "6 Clicks" Mobility  Outcome Measure Help needed turning from your back to your side while in a flat bed without using bedrails?: None Help needed moving from lying on your back to sitting on the side of a flat bed without using bedrails?: None Help needed moving to and from a bed to a chair (including a wheelchair)?: A Little Help needed standing up from a chair using your arms (e.g., wheelchair or bedside chair)?: A Little Help needed to walk in hospital room?: A Little Help needed climbing 3-5 steps with a railing? : A Lot 6 Click Score: 19    End of Session Equipment Utilized During Treatment: Gait belt Activity Tolerance: Patient tolerated treatment well Patient left: in chair;with call bell/phone within reach Nurse Communication: Mobility status PT Visit Diagnosis: Unsteadiness on feet (R26.81);Other abnormalities of gait and mobility (R26.89)    Time: 3267-1245 PT Time Calculation (min) (ACUTE ONLY): 27 min   Charges:   PT Evaluation $PT  Eval Moderate Complexity: 1 Mod PT Treatments $Gait Training: 8-22 mins        Filippo Puls B. [name] PT, DPT Acute Rehabilitation Services Pager [phone] Office [phone]   [place] 11/10/2019, 10:52 AM

## 2019-11-10 NOTE — TOC Initial Note (Signed)
Transition of Care [facility]) - Initial/Assessment Note    Patient Details  Name: Dennis Macias MRN: [id number] Date of Birth: 1950/12/05  Transition of Care [facility]) CM/SW Contact:    Bethena Roys, RN Phone Number: 11/10/2019, 2:02 PM  Clinical Narrative:  Patient presented for a syncopal episode. Prior to arrival patient was from home in an apartment with his girlfriend. Patient is currently active with [facility] for Registered Nurse and Physical Therapy- office to add Occupational Therapy for services. Patient will not need any durable medical equipment at this time. Case Manager will continue to follow for additional transition of care needs. Patient will need home health RN/PT/OT orders and F2F before he transitions home. Patient has primary care provider- states he makes it to appointments. Case Manager will continue to follow for additional transition of care needs.     Expected Discharge Plan: [place] Barriers to Discharge: Continued Medical Work up   Patient Goals and CMS Choice Patient states their goals for this hospitalization and ongoing recovery are:: to return home   Choice offered to / list presented to : NA  Expected Discharge Plan and Services Expected Discharge Plan: [place] In-house Referral: NA Discharge Planning Services: CM Consult Post Acute Care Choice: Home Health, Resumption of Svcs/PTA Provider(Active with Nurse, learning disability for [facility] RN/PT- OT to be added.)                   DME Arranged: N/A         HH Arranged: RN, Disease Management, PT, OT HH Agency: [place] Date [facility] Agency Contacted: 11/10/19 Time HH Agency Contacted: 55 Representative spoke with at [place]: Tommi Rumps  Prior Living Arrangements/Services   Lives with:: Significant Other Patient language and need for interpreter reviewed:: Yes Do you feel safe going back to the place where you live?: Yes      Need for Family Participation in Patient  Care: Yes (Comment) Care giver support system in place?: Yes (comment) Current home services: Home PT, Home RN(Active with [facility]) Criminal Activity/Legal Involvement Pertinent to Current Situation/Hospitalization: No - Comment as needed  Activities of Daily Living Home Assistive Devices/Equipment: Eyeglasses, Environmental consultant (specify type) ADL Screening (condition at time of admission) Patient's cognitive ability adequate to safely complete daily activities?: Yes Is the patient deaf or have difficulty hearing?: Yes Does the patient have difficulty seeing, even when wearing glasses/contacts?: No Does the patient have difficulty concentrating, remembering, or making decisions?: Yes Patient able to express need for assistance with ADLs?: Yes Does the patient have difficulty dressing or bathing?: No Independently performs ADLs?: Yes (appropriate for developmental age) Does the patient have difficulty walking or climbing stairs?: Yes Weakness of Legs: Both Weakness of Arms/Hands: None  Permission Sought/Granted Permission sought to share information with : Family Supports, Investment banker, corporate granted to share info w AGENCY: [name]        Emotional Assessment Appearance:: Appears stated age Attitude/Demeanor/Rapport: Engaged Affect (typically observed): Appropriate Orientation: : Oriented to Situation, Oriented to  Time, Oriented to Self Alcohol / Substance Use: Not Applicable Psych Involvement: No (comment)  Admission diagnosis:  Syncope and collapse [R55] Syncope [R55] Patient Active Problem List   Diagnosis Date Noted  . Chronic combined systolic and diastolic heart failure ([place])   . Non-ST elevation (NSTEMI) myocardial infarction ([place])   . Syncope 11/06/2019  . Acute combined systolic and diastolic congestive heart failure ([place]) 10/21/2019  .  Ischemic cardiomyopathy: Probable 10/21/2019  . Pleural effusion transudative   . S/P TURP   . Cardiorenal  syndrome   . Dyspnea 10/15/2019  . Elevated TSH 09/29/2019  . Pleural effusion 09/21/2019  . Fatigue 09/17/2019  . Hematuria, unspecified 08/13/2019  . Nephrotic syndrome 08/05/2019  . Protein calorie malnutrition ([place]) 07/31/2019  . Left renal mass 07/21/2019  . CKD (chronic kidney disease) stage 4, GFR 15-29 ml/min (HCC) 04/23/2019  . Hip weakness 08/13/2018  . Pernicious anemia 02/24/2018  . Weight loss, non-intentional 08/26/2017  . 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 Syncope and collapse 02/26/2017  . BPH with urinary obstruction   . Normocytic anemia 12/21/2016  . 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 Hyperlipidemia 09/13/2015  . Diabetes mellitus with retinopathy of both eyes ([place])   . Essential hypertension    PCP:  [name], MD Pharmacy:   [facility], [place]. [address] [place] [address] Phone: [phone] Fax: [phone]  Readmission Risk Interventions Readmission Risk Prevention Plan 11/10/2019 08/10/2019  Transportation Screening Complete Complete  PCP or Specialist Appt within 3-5 Days - Complete  HRI or [place] - Complete  Social Work Consult for [place] Planning/Counseling - Complete  Palliative Care Screening - Complete  Medication Review Press photographer) Complete Complete  HRI or Home Care Consult Complete -  SW Recovery Care/Counseling Consult Complete -  Palliative Care Screening Not Applicable -  Skilled Nursing Facility Complete -  Some recent data might be hidden

## 2019-11-10 NOTE — Progress Notes (Signed)
Patient ID: Dennis Macias, male   DOB: January 23, 1951, 69 y.o.   MRN: [id number] S: Feels well no new complaints O:BP (!) 165/68 (BP Location: Left Arm)   Pulse (!) 52   Temp 98.3 F (36.8 C) (Oral)   Resp 17   Ht 6' (1.829 m)   Wt 72.5 kg   SpO2 98%   BMI 21.68 kg/m   Intake/Output Summary (Last 24 hours) at 11/10/2019 1204 Last data filed at 11/09/2019 2000 Gross per 24 hour  Intake 719.35 ml  Output 500 ml  Net 219.35 ml   Intake/Output: I/O last 3 completed shifts: In: 719.4 [P.O.:280; I.V.:439.4] Out: 1295 [JJHER:[id number]]  Intake/Output this shift:  No intake/output data recorded. Weight change: -7.5 kg Gen: NAD CVS: no rub Resp: cta Abd: +BS, soft, NT/ND Ext: no edema  Recent Labs  Lab 11/06/19 0935 11/07/19 0318 11/08/19 0445 11/09/19 0403 11/10/19 0349  NA 137 137 138 142 138  K 4.3 4.1 4.1 3.6 3.4*  CL 106 106 109 110 111  CO2 20* 20* 20* 21* 19*  GLUCOSE 173* 126* 152* 95 118*  BUN 55* 54* 59* 59* 60*  CREATININE 3.84* 3.94* 4.13* 4.34* 4.18*  ALBUMIN  --   --   --   --  1.9*  CALCIUM 8.8* 9.0 8.6* 8.5* 8.2*  PHOS  --   --   --   --  4.5   Liver Function Tests: Recent Labs  Lab 11/10/19 0349  ALBUMIN 1.9*   No results for input(s): LIPASE, AMYLASE in the last 168 hours. No results for input(s): AMMONIA in the last 168 hours. CBC: Recent Labs  Lab 11/06/19 0935 11/06/19 0935 11/07/19 0318 11/08/19 0445 11/10/19 0349  WBC 13.2*   < > 8.7 8.2 5.1  NEUTROABS 11.5*  --   --   --   --   HGB 8.5*   < > 8.4* 8.1* 8.2*  HCT 25.7*   < > 24.8* 24.5* 25.1*  MCV 91.5  --  88.6 90.4 91.6  PLT 214   < > 196 193 210   < > = values in this interval not displayed.   Cardiac Enzymes: No results for input(s): CKTOTAL, CKMB, CKMBINDEX, TROPONINI in the last 168 hours. CBG: Recent Labs  Lab 11/09/19 1058 11/09/19 1554 11/09/19 2126 11/10/19 0751 11/10/19 1158  GLUCAP 93 103* 130* 95 116*    Iron Studies:  Recent Labs    11/10/19 0349  IRON 34*  TIBC  136*  FERRITIN 282   Studies/Results: No results found. Marland Kitchen aspirin EC  81 mg Oral Daily  . atorvastatin  80 mg Oral QHS  . carvedilol  6.25 mg Oral BID WC  . cloNIDine  0.1 mg Oral BID  . dorzolamide-timolol  1 drop Right Eye BID  . ferrous gluconate  324 mg Oral Q breakfast  . finasteride  5 mg Oral Daily  . heparin  5,000 Units Subcutaneous Q8H  . hydrALAZINE  100 mg Oral TID  . insulin aspart  0-9 Units Subcutaneous TID WC  . isosorbide dinitrate  40 mg Oral TID  . potassium chloride  30 mEq Oral BID  . sodium bicarbonate  650 mg Oral BID  . tamsulosin  0.4 mg Oral QPC supper  . vitamin B-12  1,000 mcg Oral Daily    BMET    Component Value Date/Time   NA 138 11/10/2019 0349   NA 140 08/13/2019 1139   K 3.4 (L) 11/10/2019 0349   CL 111 11/10/2019  0349   CO2 19 (L) 11/10/2019 0349   GLUCOSE 118 (H) 11/10/2019 0349   BUN 60 (H) 11/10/2019 0349   BUN 36 (H) 08/13/2019 1139   CREATININE 4.18 (H) 11/10/2019 0349   CALCIUM 8.2 (L) 11/10/2019 0349   GFRNONAA 14 (L) 11/10/2019 0349   GFRAA 16 (L) 11/10/2019 0349   CBC    Component Value Date/Time   WBC 5.1 11/10/2019 0349   RBC 2.74 (L) 11/10/2019 0349   HGB 8.2 (L) 11/10/2019 0349   HGB 7.7 (L) 08/13/2019 1139   HCT 25.1 (L) 11/10/2019 0349   HCT 22.3 (L) 08/13/2019 1139   PLT 210 11/10/2019 0349   PLT 392 08/13/2019 1139   MCV 91.6 11/10/2019 0349   MCV 86 08/13/2019 1139   MCH 29.9 11/10/2019 0349   MCHC 32.7 11/10/2019 0349   RDW 13.2 11/10/2019 0349   RDW 14.4 08/13/2019 1139   LYMPHSABS 1.1 11/06/2019 0935   LYMPHSABS 2.0 02/24/2018 1142   MONOABS 0.6 11/06/2019 0935   EOSABS 0.0 11/06/2019 0935   EOSABS 0.1 02/24/2018 1142   BASOSABS 0.0 11/06/2019 0935   BASOSABS 0.0 02/24/2018 1142   Assessment/Plan:  1. AKI/CKD stage 4- following a syncopal episode.    2. Syncope/NSTEMI- possible arrythmia. Cardiology consulted and wants to proceed with cardiac cath, however Scr climbing since  admission. 1. BUN/Cr finally reached a peak of 4.34 and now down to 4.18 2. Hold off on LHC for now in hopes that Scr will continue to improve and discussed with Dr. Oval Linsey who is in agreement.  Hopefully in the next 24-48 hours 3. continue IV NS at 64ml/hr and follow UOP and Scr 4. Need to minimize contrast exposure and hold diuretics prior to [facility] and cont with gentle IVF's pre and post cath. 3. CKD stage 4- presumably due to DM and followed by Dr. Hollie Salk in our office and baseline Scr 3.5.   4. HTN- increased hydralazine but BP remains elevated.  Cont to follow 5. Fever- Tmax 100.5 last night.  Afebrile today. Urine culture + for pseudomonas aeruginosa sensitive to cefepime and ceftaz, cipro.  Started on cefepime and follow.  6. Anemia of CKD- check iron stores and likely start [place], [facility] [phone]

## 2019-11-10 NOTE — Progress Notes (Signed)
Subjective: Mr. Jaquith was seen at beside this AM. The patient does not endorse any specific complaints today. He is afebrile and denies symptoms such as chills, dysuria, CP, or SOB. He states that he is unsure of his last outpatient Urology appointment. There are no other complaints or concerns at this time.  Objective:  Vital signs in last 24 hours: Vitals:   11/09/19 1615 11/09/19 2050 11/10/19 0006 11/10/19 0618  BP: (!) 182/76 (!) 172/76 (!) 157/74 (!) 165/68  Pulse:  (!) 56  (!) 52  Resp: 17     Temp: 98.4 F (36.9 C) 98.6 F (37 C) 98.2 F (36.8 C) 98.3 F (36.8 C)  TempSrc: Oral Oral Oral Oral  SpO2: 97% 97%  98%  Weight:    72.5 kg  Height:       General: Elderly male, NAD, laying in bed Cardiac: RRR, no m/r/g Pulmonary: CTABL, no wheezing, rhonchi, rales Abdomen: Soft, non-tender, non-distended Extremities: No LE edema  Assessment/Plan:  Jeffren Dombek is a 69 y.o. male with a history of CAD chronic systolic and diastolic heart failure, CKD stage IV, anemia of chronic disease, and BPH with chronic indwelling Foley catheter status post TURP who presented with a syncopal episode that occurred on 11/06/19.   Active Problems:   Syncope and collapse   Syncope   Chronic combined systolic and diastolic heart failure (HCC)   Non-ST elevation (NSTEMI) myocardial infarction [facility])  Syncope: Patient had an episode of syncope that occurred during breakfast on 04/30; no presyncopal times or seizure activity noted. Reported increase in his home BP medications on 04/29 and he had just taken his medications prior to the episode. Labs were significant for troponin elevation of 1600 > 2300 > 2200. Differential includes arrhythmia; cardiology is on board and planning catheterization although his renal function makes this challenging given risk of contrast nephropathy.  -Cardiology following, appreciate recommendations -Cardiology to proceed with left heart cath, but will hold off for now  given rising Scr (4.18 today; baseline of 3.5) -Continue telemetry -Continue home BP medications  Hypertension: -Patient recently had his hydralazine and isosorbide medications increased at home before his syncopal episode. However, the patient remains hypertensive, and we will continue hydralazine regimen and have added clonidine 0.1 mg BID per cardiology. -Hydralazine 100 mg PO TID  -Isordil, 40 mg PO TID -Clonidine 0.1 mg PO BID  Chronic systolic and diastolic heart failure: BNP on admission elevated to>4000. Was on Lasix initially, but this was recently discontinued given climbing Scr levels. He is euvolemic on examination today and had adequate urine output of 500 mL in the past 24 hours. Last recorded dry weight was 80.9 kg in Feb 2021, although his weight was recorded at 70.3 kg on 04/30, 80 kg today on 05/02-05/03, and 72.5 kg today. Will continue to monitor his urine output and weight changes during his admission. -Holding home Lasix -Strict I's/O's -Daily weights  CKD stage IV: Creatinine on admission was 3.84, near his baseline. Creatinine has been slowly rising 3.84>>3.94>>4.12>>4.34 today. Nephrology is currently on board. His rising creatinine could be the result of pre-renal (prior Lasix regimen vs. CKD progression 2/2 DM) and post-renal AKI 2/2 BPH. On 05/02, bladder scan showed 400 mL but an accurate PVR was unable to be obtained; subsequent bladder scan showed 313 mL. Will plan in and out cath and will reassess this afternoon. -Nephrology consulted; appreciate their recs -Continue IV NS at 57ml/hr; will trend UOP and Scr -Sodium bicarbonate -In and out cath;  will recheck in the afternoon  UA showing pyuria; urine cultures positive for Pseudomonas Over the weekend, the patient developed fevers, chills, and some diaphoresis. He is otherwise asymptomatic and is currently afebrile. The exact etiology is unclear at this time, though his indwelling catheter could be a potential  source of infection. He is on day 3 of cefepime regimen for urine cultures positive for cefepime-sensitive Pseudomonas species. Will obtain urine culture following in and out cath as above. -Continue cefepime regimen, currently on day 3 of 7-10. -Tylenol for fevers   Anemia of chronic disease: -Continue ferrous gluconate -Aranesp per nephrology. Can consider initiation of IV iron prior to ESA therapy.  Prior to Admission Living Arrangement: Home Anticipated Discharge Location: TBD Barriers to Discharge: Pending further evalaution Dispo: Anticipated discharge in approximately 1-2 day(s).   Orvis Brill, Medical Student 11/10/2019, 11:42 AM Pager: [phone]

## 2019-11-10 NOTE — Progress Notes (Signed)
Patient stating having on and off dull pain on his right underarm pain on and off throughout the day yesterday. He denied chest pain, shortness of breath or palpitations. Upon assessment patient denied underarm pain but stated it had happened earlier in the shift. Gave patient pain medication.   Got called by telemetry that patient had a 3 beats of Vtach. Upon assessment patient denied palpitations, chest pain, and sob. Will continue to monitor patient closely.

## 2019-11-10 NOTE — Progress Notes (Signed)
PHARMACY - PHYSICIAN COMMUNICATION CRITICAL VALUE ALERT - BLOOD CULTURE IDENTIFICATION (BCID)  Dennis Macias is an 69 y.o. male who presented to [facility] on 11/06/2019 with a chief complaint of syncopal episode  Assessment:  Pt presented with syncopal event. He is on D3 of cefepime for the pseudomonas in the urine. Lab called today with the BCID result of 1/4 bottles with pseudomonas in the blood. It's likely to be the same isolate in the urine. D/w Mitzie Na.   Name of physician (or Provider) Contacted: Mitzie Na MSIV  Current antibiotics: Cefepime  Changes to prescribed antibiotics recommended:  Continue cefepime 2g IV q24  Results for orders placed or performed during the hospital encounter of 11/06/19  Blood Culture ID Panel (Reflexed) (Collected: 11/07/2019 10:03 AM)  Result Value Ref Range   Enterococcus species NOT DETECTED NOT DETECTED   Listeria monocytogenes NOT DETECTED NOT DETECTED   Staphylococcus species NOT DETECTED NOT DETECTED   Staphylococcus aureus (BCID) NOT DETECTED NOT DETECTED   Streptococcus species NOT DETECTED NOT DETECTED   Streptococcus agalactiae NOT DETECTED NOT DETECTED   Streptococcus pneumoniae NOT DETECTED NOT DETECTED   Streptococcus pyogenes NOT DETECTED NOT DETECTED   Acinetobacter baumannii NOT DETECTED NOT DETECTED   Enterobacteriaceae species NOT DETECTED NOT DETECTED   Enterobacter cloacae complex NOT DETECTED NOT DETECTED   Escherichia coli NOT DETECTED NOT DETECTED   Klebsiella oxytoca NOT DETECTED NOT DETECTED   Klebsiella pneumoniae NOT DETECTED NOT DETECTED   Proteus species NOT DETECTED NOT DETECTED   Serratia marcescens NOT DETECTED NOT DETECTED   Carbapenem resistance NOT DETECTED NOT DETECTED   Haemophilus influenzae NOT DETECTED NOT DETECTED   Neisseria meningitidis NOT DETECTED NOT DETECTED   Pseudomonas aeruginosa DETECTED (A) NOT DETECTED   Candida albicans NOT DETECTED NOT DETECTED   Candida glabrata NOT DETECTED  NOT DETECTED   Candida krusei NOT DETECTED NOT DETECTED   Candida parapsilosis NOT DETECTED NOT DETECTED   Candida tropicalis NOT DETECTED NOT DETECTED    Onnie Boer, PharmD, BCIDP, AAHIVP, CPP Infectious Disease Pharmacist 11/10/2019 3:37 PM

## 2019-11-10 NOTE — Progress Notes (Addendum)
Progress Note  Patient Name: Arin Peral Date of Encounter: 11/10/2019  Primary Cardiologist: Larae Grooms, MD   Subjective   Creatinine slightly improved today to 4.18. Urine culture positive for psuedomonas on abx. Patient has not had a fever in the last 24 hours.  Inpatient Medications    Scheduled Meds: . aspirin EC  81 mg Oral Daily  . atorvastatin  80 mg Oral QHS  . carvedilol  6.25 mg Oral BID WC  . dorzolamide-timolol  1 drop Right Eye BID  . ferrous gluconate  324 mg Oral Q breakfast  . finasteride  5 mg Oral Daily  . heparin  5,000 Units Subcutaneous Q8H  . hydrALAZINE  100 mg Oral TID  . insulin aspart  0-9 Units Subcutaneous TID WC  . isosorbide dinitrate  40 mg Oral TID  . sodium bicarbonate  650 mg Oral BID  . tamsulosin  0.4 mg Oral QPC supper  . vitamin B-12  1,000 mcg Oral Daily   Continuous Infusions: . sodium chloride 75 mL/hr at 11/09/19 1643  . ceFEPime (MAXIPIME) IV 2 g (11/09/19 1646)   PRN Meds: acetaminophen **OR** acetaminophen   Vital Signs    Vitals:   11/09/19 1615 11/09/19 2050 11/10/19 0006 11/10/19 0618  BP: (!) 182/76 (!) 172/76 (!) 157/74 (!) 165/68  Pulse:  (!) 56  (!) 52  Resp: 17     Temp: 98.4 F (36.9 C) 98.6 F (37 C) 98.2 F (36.8 C) 98.3 F (36.8 C)  TempSrc: Oral Oral Oral Oral  SpO2: 97% 97%  98%  Weight:    72.5 kg  Height:        Intake/Output Summary (Last 24 hours) at 11/10/2019 0703 Last data filed at 11/09/2019 2000 Gross per 24 hour  Intake 719.35 ml  Output 500 ml  Net 219.35 ml   Last 3 Weights 11/10/2019 11/09/2019 11/08/2019  Weight (lbs) 159 lb 13.3 oz 176 lb 5.9 oz 176 lb 5.9 oz  Weight (kg) 72.5 kg 80 kg 80 kg      Telemetry    Sinus bradycardia, HR in the 50s, down to 49 at night. 8 beats NSVT - Personally Reviewed  ECG    No new - Personally Reviewed  Physical Exam   GEN: No acute distress.   Neck: No JVD Cardiac: RRR, no murmurs, rubs, or gallops.  Respiratory: Diminished at  bases GI: Soft, nontender, non-distended  MS: No edema; No deformity. Neuro:  Nonfocal  Psych: Normal affect   Labs    High Sensitivity Troponin:   Recent Labs  Lab 10/14/19 2259 10/15/19 0052 11/06/19 0935 11/06/19 1924 11/06/19 2235  TROPONINIHS 20* 19* 1,618* 2,384* 2,233*      Chemistry Recent Labs  Lab 11/08/19 0445 11/09/19 0403 11/10/19 0349  NA 138 142 138  K 4.1 3.6 3.4*  CL 109 110 111  CO2 20* 21* 19*  GLUCOSE 152* 95 118*  BUN 59* 59* 60*  CREATININE 4.13* 4.34* 4.18*  CALCIUM 8.6* 8.5* 8.2*  ALBUMIN  --   --  1.9*  GFRNONAA 14* 13* 14*  GFRAA 16* 15* 16*  ANIONGAP 9 11 8      Hematology Recent Labs  Lab 11/07/19 0318 11/08/19 0445 11/10/19 0349  WBC 8.7 8.2 5.1  RBC 2.80* 2.71* 2.74*  HGB 8.4* 8.1* 8.2*  HCT 24.8* 24.5* 25.1*  MCV 88.6 90.4 91.6  MCH 30.0 29.9 29.9  MCHC 33.9 33.1 32.7  RDW 13.2 13.3 13.2  PLT 196 193 210  BNP Recent Labs  Lab 11/06/19 0935  BNP 4,075.9*     DDimer No results for input(s): DDIMER in the last 168 hours.   Radiology    No results found.  Cardiac Studies   Echo 10/15/19 IMPRESSIONS    1. Left ventricular ejection fraction, by estimation, is 40 to 45%. The  left ventricle has mildly decreased function. The left ventricle  demonstrates regional wall motion abnormalities (see scoring  diagram/findings for description). There is mild  asymmetric left ventricular hypertrophy of the septal segment. Left  ventricular diastolic parameters are consistent with Grade I diastolic  dysfunction (impaired relaxation).  2. Right ventricular systolic function is normal. The right ventricular  size is normal. There is mildly elevated pulmonary artery systolic  pressure.  3. Left atrial size was severely dilated.  4. Moderate pleural effusion.  5. The mitral valve is normal in structure. Mild mitral valve  regurgitation. No evidence of mitral stenosis.  6. The aortic valve is tricuspid. Aortic valve  regurgitation is not  visualized. Mild to moderate aortic valve sclerosis/calcification is  present, without any evidence of aortic stenosis.  7. The inferior vena cava is normal in size with greater than 50%  respiratory variability, suggesting right atrial pressure of 3 mmHg.   Comparison(s): Changes from prior study are noted. The left ventricular  function is worsened.   FINDINGS  Left Ventricle: Left ventricular ejection fraction, by estimation, is 40  to 45%. The left ventricle has mildly decreased function. The left  ventricle demonstrates regional wall motion abnormalities. The left  ventricular internal cavity size was normal  in size. There is mild asymmetric left ventricular hypertrophy of the  septal segment. Left ventricular diastolic parameters are consistent with  Grade I diastolic dysfunction (impaired relaxation).     LV Wall Scoring:  The entire apex is akinetic.   Right Ventricle: The right ventricular size is normal. No increase in  right ventricular wall thickness. Right ventricular systolic function is  normal. There is mildly elevated pulmonary artery systolic pressure. The  tricuspid regurgitant velocity is 2.95  m/s, and with an assumed right atrial pressure of 3 mmHg, the estimated  right ventricular systolic pressure is 89.3 mmHg.   Left Atrium: Left atrial size was severely dilated.   Right Atrium: Right atrial size was normal in size.   Pericardium: Trivial pericardial effusion is present.   Mitral Valve: The mitral valve is normal in structure. Normal mobility of  the mitral valve leaflets. Mild mitral valve regurgitation. No evidence of  mitral valve stenosis.   Tricuspid Valve: The tricuspid valve is normal in structure. Tricuspid  valve regurgitation is not demonstrated. No evidence of tricuspid  stenosis.   Aortic Valve: The aortic valve is tricuspid. Aortic valve regurgitation is  not visualized. Mild to moderate aortic valve  sclerosis/calcification is  present, without any evidence of aortic stenosis.   Patient Profile     69 y.o. male with past medical history of coronary artery disease, ischemic cardiomyopathy, hypertension, hyperlipidemia, chronic stage IV kidney disease, chronic indwelling Foley catheter, history of CVA for evaluation of syncope. Echocardiogram April 2021 showed ejection fraction 40 to 73%, grade 1 diastolic dysfunction, severe left atrial enlargement, mild mitral regurgitation. Nuclear study April 2021 showed ejection fraction 40%. There was apical infarction with peri-infarct ischemia. Patient has been treated medically due to renal insufficiency and lack of symptoms.   Assessment & Plan    Syncope - unclear etiology. Possibly arrythmia - Patient had had multiple admission  in the last couple months (Feb for nephrotic syndrome, March with anasarca and Rt thoracentesis - Tele with NSVT and PVCs - Hs trop also up in the setting of renal insufficiency but could also have CAD - Recent stress shows ischemia and reduced LV function of40% - Plan for cardiac cath for definitive evaluation after fever resolves. Appears patient had not had a fever in the last 24 hours. Creatinine slightly improved today 4.34>4.18 (baseline around 3.5). Will discuss with MD plan for cath today>>given elevated creatinine will wait on cath.   CKD stage IV - nephrology following. Patient may require dialysis after cath - lasix held was given IVF - creatinine 4.34 > 4.18  - Baseline around 3.5  Ischemic CM/Acute on chronic Diastolic HF - EF 93% this admission - BNP up to 4075 on admission and patient was started on IV lasix - net -2L since admission - Lasix held for possible cath - continue BB. Cannot titrate for bradycardia. Continue hydralazine, nitrates  HTN - Hydralazine increased for elevated pressures - Metoprolol switched to Coreg 6.25mg  BID. Cannot titrate due to bradycardia, Hr in the 50s - continue  Isordil 40mg  TID - Pressure still elevated  Fever - Unclear etiology, possibly from UTI. Patient has chronic indwelling Foley  - abx per IM  Anemia - Seeing nephrology for Epogen  Urinary retention - condom cath  For questions or updates, please contact [facility] Please consult [email or web address] for contact info under        Signed, Youlanda Tomassetti Ninfa Meeker, PA-C  11/10/2019, 7:03 AM

## 2019-11-10 NOTE — Evaluation (Signed)
Occupational Therapy Evaluation Patient Details Name: Dennis Macias MRN: [id number] DOB: 05-26-1951 Today's Date: 11/10/2019    History of Present Illness 69 y.o. male with a PMHx of CAD, ischemic cardiomyopathy, chronic systolic and diastolic heart failure, CKD stage IV, anemia of chronic disease, and BPH with chronic indwelling foley catheter s/p TURP 10/20/19. Came to ED after syncopal episode found to be hypertensive and febrile. Admitted 11/06/19. Plan for cardiac cath however creatinine levels elevated.    Clinical Impression   PTA patient reports independent with ADLs, assist for IADLs, using cane/RW for mobility as needed. Patient admitted for above and limited by problem list below, including generalized weakness, decreased activity tolerance.  He currently requires min guard for transfers using RW and min assist to supervision for ADLs. Limited eval, as patient declining to participate in further mobility than transferring back to bed.  He reports he will have support of his son and grandson when his girlfriend is at work.  Pt will benefit from continued OT services while admitted and after dc at [facility] level to optimize independence and safety with ADLs, mobility.     Follow Up Recommendations  Home health OT;Supervision/Assistance - 24 hour    Equipment Recommendations  None recommended by OT    Recommendations for Other Services       Precautions / Restrictions Precautions Precautions: Fall Restrictions Weight Bearing Restrictions: No      Mobility Bed Mobility Overal bed mobility: Modified Independent             General bed mobility comments: returned from EOB to supine with no assist  Transfers Overall transfer level: Needs assistance Equipment used: Rolling walker (2 wheeled) Transfers: Sit to/from Stand Sit to Stand: Min guard         General transfer comment: cueing for hand placement and safety, min guard to steady    Balance Overall balance assessment:  Needs assistance Sitting-balance support: Feet supported Sitting balance-Leahy Scale: Good Sitting balance - Comments: able to dynamically reach feet with supervision   Standing balance support: Bilateral upper extremity supported;During functional activity Standing balance-Leahy Scale: Poor Standing balance comment: requires UE support                           ADL either performed or assessed with clinical judgement   ADL Overall ADL's : Needs assistance/impaired     Grooming: Set up;Sitting   Upper Body Bathing: Set up;Sitting   Lower Body Bathing: Minimal assistance;Sit to/from stand;Cueing for safety   Upper Body Dressing : Set up;Sitting   Lower Body Dressing: Minimal assistance;Sit to/from stand;Cueing for safety   Toilet Transfer: Min Insurance claims handler Details (indicate cue type and reason): simulated in room          Functional mobility during ADLs: Min guard;Rolling walker General ADL Comments: limited eval, pt fatigued and requesting to return to bed     Vision   Additional Comments: not tested     Perception     Praxis      Pertinent Vitals/Pain Pain Assessment: No/denies pain     Hand Dominance Right   Extremity/Trunk Assessment Upper Extremity Assessment Upper Extremity Assessment: Generalized weakness   Lower Extremity Assessment Lower Extremity Assessment: Defer to PT evaluation       Communication Communication Communication: HOH   Cognition Arousal/Alertness: Awake/alert Behavior During Therapy: WFL for tasks assessed/performed Overall Cognitive Status: Within Functional Limits for tasks assessed  General Comments  VSS     Exercises     Shoulder Instructions      Home Living Family/patient expects to be discharged to:: Private residence Living Arrangements: Spouse/significant other Available Help at Discharge: Family Type of Home:  Apartment Home Access: Stairs to enter [employer] of Steps: 6 Entrance Stairs-Rails: Left Home Layout: One level;Able to live on main level with bedroom/bathroom     Bathroom Shower/Tub: Tub/shower unit   Bathroom Toilet: Handicapped height Bathroom Accessibility: Yes   Home Equipment: Bedside commode;Wheelchair - manual;Cane - Set designer - 2 wheels   Additional Comments: girlfriend works, his children live near by and can come help him PRN      Prior Functioning/Environment Level of Independence: Needs assistance  Gait / Transfers Assistance Needed: usually ambulates with walker or cane, limited community distances ADL's / Homemaking Assistance Needed: independent with dressing, and sponge baths, girlfriend does cooking and cleaning            OT Problem List: Decreased activity tolerance;Impaired balance (sitting and/or standing);Decreased knowledge of use of DME or AE;Decreased strength      OT Treatment/Interventions: Self-care/ADL training;Energy conservation;Therapeutic activities;Balance training;Patient/family education    OT Goals(Current goals can be found in the care plan section) Acute Rehab OT Goals Patient Stated Goal: to move better OT Goal Formulation: With patient Time For Goal Achievement: 11/24/19 Potential to Achieve Goals: Good  OT Frequency: Min 2X/week   Barriers to D/C:            Co-evaluation              AM-PAC OT "6 Clicks" Daily Activity     Outcome Measure Help from another person eating meals?: None Help from another person taking care of personal grooming?: A Little Help from another person toileting, which includes using toliet, bedpan, or urinal?: A Little Help from another person bathing (including washing, rinsing, drying)?: A Little Help from another person to put on and taking off regular upper body clothing?: None Help from another person to put on and taking off regular lower body clothing?: A Little 6  Click Score: 20   End of Session Equipment Utilized During Treatment: Rolling walker Nurse Communication: Mobility status  Activity Tolerance: Patient limited by fatigue Patient left: in bed;with call bell/phone within reach;with bed alarm set  OT Visit Diagnosis: Unsteadiness on feet (R26.81);Muscle weakness (generalized) (M62.81)                Time: 5093-2671 OT Time Calculation (min): 10 min Charges:  OT General Charges $OT Visit: 1 Visit OT Evaluation $OT Eval Low Complexity: 1 Low  Jolaine Artist, OT Acute Rehabilitation Services Pager [phone] Office [phone]   Delight Stare 11/10/2019, 12:21 PM

## 2019-11-11 ENCOUNTER — Encounter: Payer: Self-pay | Admitting: Internal Medicine

## 2019-11-11 ENCOUNTER — Inpatient Hospital Stay (HOSPITAL_COMMUNITY): Payer: Medicare PPO

## 2019-11-11 LAB — RENAL FUNCTION PANEL
Albumin: 2 g/dL — ABNORMAL LOW (ref 3.5–5.0)
Albumin: 2 g/dL — ABNORMAL LOW (ref 3.5–5.0)
Anion gap: 13 (ref 5–15)
Anion gap: 11 (ref 5–15)
BUN: 60 mg/dL — ABNORMAL HIGH (ref 8–23)
BUN: 58 mg/dL — ABNORMAL HIGH (ref 8–23)
CO2: 16 mmol/L — ABNORMAL LOW (ref 22–32)
CO2: 16 mmol/L — ABNORMAL LOW (ref 22–32)
Calcium: 8.3 mg/dL — ABNORMAL LOW (ref 8.9–10.3)
Calcium: 8 mg/dL — ABNORMAL LOW (ref 8.9–10.3)
Chloride: 111 mmol/L (ref 98–111)
Chloride: 113 mmol/L — ABNORMAL HIGH (ref 98–111)
Creatinine, Ser: 4.03 mg/dL — ABNORMAL HIGH (ref 0.61–1.24)
Creatinine, Ser: 3.88 mg/dL — ABNORMAL HIGH (ref 0.61–1.24)
GFR calc Af Amer: 17 mL/min — ABNORMAL LOW (ref >60)
GFR calc Af Amer: 17 mL/min — ABNORMAL LOW (ref >60)
GFR calc non Af Amer: 14 mL/min — ABNORMAL LOW (ref >60)
GFR calc non Af Amer: 15 mL/min — ABNORMAL LOW (ref >60)
Glucose, Bld: 93 mg/dL (ref 70–99)
Glucose, Bld: 108 mg/dL — ABNORMAL HIGH (ref 70–99)
Phosphorus: 3.9 mg/dL (ref 2.5–4.6)
Phosphorus: 4 mg/dL (ref 2.5–4.6)
Potassium: 4.1 mmol/L (ref 3.5–5.1)
Potassium: 4 mmol/L (ref 3.5–5.1)
Sodium: 140 mmol/L (ref 135–145)
Sodium: 140 mmol/L (ref 135–145)

## 2019-11-11 LAB — CBC
HCT: 25.2 % — ABNORMAL LOW (ref 39.0–52.0)
Hemoglobin: 8.3 g/dL — ABNORMAL LOW (ref 13.0–17.0)
MCH: 30.2 pg (ref 26.0–34.0)
MCHC: 32.9 g/dL (ref 30.0–36.0)
MCV: 91.6 fL (ref 80.0–100.0)
Platelets: 210 10*3/uL (ref 150–400)
RBC: 2.75 MIL/uL — ABNORMAL LOW (ref 4.22–5.81)
RDW: 13.1 % (ref 11.5–15.5)
WBC: 5.1 10*3/uL (ref 4.0–10.5)
nRBC: 0 % (ref 0.0–0.2)

## 2019-11-11 LAB — LACTIC ACID, PLASMA
Lactic Acid, Venous: 0.5 mmol/L (ref 0.5–1.9)
Lactic Acid, Venous: 0.6 mmol/L (ref 0.5–1.9)

## 2019-11-11 LAB — BLOOD GAS, ARTERIAL
Acid-base deficit: 8.1 mmol/L — ABNORMAL HIGH (ref 0.0–2.0)
Bicarbonate: 16.3 mmol/L — ABNORMAL LOW (ref 20.0–28.0)
Drawn by: 30136
FIO2: 21
O2 Saturation: 95.4 %
Patient temperature: 36.8
pCO2 arterial: 29.2 mmHg — ABNORMAL LOW (ref 32.0–48.0)
pH, Arterial: 7.364 (ref 7.350–7.450)
pO2, Arterial: 76.3 mmHg — ABNORMAL LOW (ref 83.0–108.0)

## 2019-11-11 LAB — GLUCOSE, CAPILLARY
Glucose-Capillary: 87 mg/dL (ref 70–99)
Glucose-Capillary: 92 mg/dL (ref 70–99)
Glucose-Capillary: 99 mg/dL (ref 70–99)

## 2019-11-11 MED ORDER — ASPIRIN 81 MG PO CHEW
81.0000 mg | CHEWABLE_TABLET | ORAL | Status: AC
  Administered 2019-11-12: 81 mg via ORAL
  Filled 2019-11-11: qty 1

## 2019-11-11 MED ORDER — SODIUM BICARBONATE 650 MG PO TABS
1300.0000 mg | ORAL_TABLET | Freq: Two times a day (BID) | ORAL | Status: DC
  Administered 2019-11-11 (×2): 1300 mg via ORAL
  Filled 2019-11-11 (×2): qty 2

## 2019-11-11 MED ORDER — SODIUM CHLORIDE 0.9 % IV SOLN: 250.0000 mL | INTRAVENOUS | Status: DC | PRN

## 2019-11-11 MED ORDER — SODIUM CHLORIDE 0.9 % IV SOLN: INTRAVENOUS | Status: DC

## 2019-11-11 MED ORDER — LACTATED RINGERS IV SOLN: INTRAVENOUS | Status: DC

## 2019-11-11 MED ORDER — SODIUM CHLORIDE 0.9% FLUSH
3.0000 mL | Freq: Two times a day (BID) | INTRAVENOUS | Status: DC
  Administered 2019-11-11 – 2019-11-24 (×16): 3 mL via INTRAVENOUS

## 2019-11-11 MED ORDER — SODIUM CHLORIDE 0.9% FLUSH: 3.0000 mL | INTRAVENOUS | Status: DC | PRN

## 2019-11-11 NOTE — Progress Notes (Signed)
EVENT NOTE:  Paged by bedside RN this afternoon regarding concern of his mental status. Notes that he has had progressive lethargy throughout today and that he was confused. He was attempting to eat lunch however just kept chewing on a piece of chicken and eventually spit it out.  I personally assessed Joson at bedside. No apparent distress. He notes that he is does not feel well however he is unable to describe further details.  Chart review indicates a slight worsening of his hypercarbia. Blood and urine cultures + for pseudomonas.   Physical exam Gen: in NAD Lungs: clear. Breathing on RA Neuro -alert but not oriented to place or situation -no CN deficit appreciated -4/5 RUE motor strength deficit. 5/5 strength in the LUE, LLE and RLE -some tremors noted in his bilateral upper extremities -possible myoclonus in the RLE. -mental status barring finger to nose or other coordination eval -possible difficulty with word expression  Assessment: broad ddx in Zimir's case -prior CVA history. Spoke with [name], Ivor's daughter, who notes that this is somewhat similar presentation as his prior stroke. Reviewed chart--does not seem to have had issues with strength in the right upper extremity. Significant stroke risk factors present. -sepsis. Blood and urine cultures + pseudomonas. Would consider this toxic encephalopathy however focalized weakness and fact that he has been on adequate abx coverage for several days not consistent with this -other consideration is uremia/MA. Bicarb was down to 16 this morning and his tremors/confusion could all fit with a uremic picture.   Plan: Stat CT head to r/o stroke Lactate to evaluate for secondary infection vs inadequate coverage vs Type B LA Will also obtain a repeat RFT for electrolyte evaluation Change IVF to LR given his acidosis. Acidotic so will change IVF to [facility]  Daughter updated on phone. Son at bedside. Will follow up diagnostics.  [name],  MD 11/11/19 2:21 PM

## 2019-11-11 NOTE — Progress Notes (Signed)
Patient ID: Dennis Macias, male   DOB: Jun 27, 1951, 69 y.o.   MRN: [id number] S: Feels well, no complaints O:BP (!) 183/77   Pulse (!) 57   Temp 98.2 F (36.8 C) (Oral)   Resp (!) 23   Ht 6' (1.829 m)   Wt 74.4 kg   SpO2 96%   BMI 22.25 kg/m   Intake/Output Summary (Last 24 hours) at 11/11/2019 1145 Last data filed at 11/11/2019 1100 Gross per 24 hour  Intake 2370.36 ml  Output 1200 ml  Net 1170.36 ml   Intake/Output: I/O last 3 completed shifts: In: 2846 [P.O.:280; I.V.:2366; IV Piggyback:200] Out: 1700 [Urine:1700]  Intake/Output this shift:  Total I/O In: 415.4 [P.O.:120; I.V.:295.4] Out: -  Weight change: 1.9 kg Gen: NAD CVS: no rub Resp: cta Abd: +BS, soft, Nt/Nd Ext: no edema  Recent Labs  Lab 11/06/19 0935 11/07/19 0318 11/08/19 0445 11/09/19 0403 11/10/19 0349 11/11/19 0508  NA 137 137 138 142 138 140  K 4.3 4.1 4.1 3.6 3.4* 4.1  CL 106 106 109 110 111 111  CO2 20* 20* 20* 21* 19* 16*  GLUCOSE 173* 126* 152* 95 118* 93  BUN 55* 54* 59* 59* 60* 60*  CREATININE 3.84* 3.94* 4.13* 4.34* 4.18* 4.03*  ALBUMIN  --   --   --   --  1.9* 2.0*  CALCIUM 8.8* 9.0 8.6* 8.5* 8.2* 8.3*  PHOS  --   --   --   --  4.5 3.9   Liver Function Tests: Recent Labs  Lab 11/10/19 0349 11/11/19 0508  ALBUMIN 1.9* 2.0*   No results for input(s): LIPASE, AMYLASE in the last 168 hours. No results for input(s): AMMONIA in the last 168 hours. CBC: Recent Labs  Lab 11/06/19 0935 11/06/19 0935 11/07/19 0318 11/07/19 0318 11/08/19 0445 11/10/19 0349 11/11/19 0508  WBC 13.2*   < > 8.7   < > 8.2 5.1 5.1  NEUTROABS 11.5*  --   --   --   --   --   --   HGB 8.5*   < > 8.4*   < > 8.1* 8.2* 8.3*  HCT 25.7*   < > 24.8*   < > 24.5* 25.1* 25.2*  MCV 91.5  --  88.6  --  90.4 91.6 91.6  PLT 214   < > 196   < > 193 210 210   < > = values in this interval not displayed.   Cardiac Enzymes: No results for input(s): CKTOTAL, CKMB, CKMBINDEX, TROPONINI in the last 168 hours. CBG: Recent  Labs  Lab 11/10/19 0751 11/10/19 1158 11/10/19 1551 11/10/19 2108 11/11/19 0737  GLUCAP 95 116* 108* 102* 87    Iron Studies:  Recent Labs    11/10/19 0349  IRON 34*  TIBC 136*  FERRITIN 282   Studies/Results: No results found. Marland Kitchen aspirin EC  81 mg Oral Daily  . atorvastatin  80 mg Oral QHS  . carvedilol  6.25 mg Oral BID WC  . cloNIDine  0.1 mg Oral BID  . darbepoetin (ARANESP) injection - NON-DIALYSIS  40 mcg Subcutaneous Q Tue-1800  . dorzolamide-timolol  1 drop Right Eye BID  . ferrous gluconate  324 mg Oral Q breakfast  . finasteride  5 mg Oral Daily  . heparin  5,000 Units Subcutaneous Q8H  . hydrALAZINE  100 mg Oral TID  . isosorbide dinitrate  40 mg Oral TID  . sodium bicarbonate  1,300 mg Oral BID  . sodium chloride flush  3 mL Intravenous Q12H  . tamsulosin  0.4 mg Oral QPC supper  . vitamin B-12  1,000 mcg Oral Daily    BMET    Component Value Date/Time   NA 140 11/11/2019 0508   NA 140 08/13/2019 1139   K 4.1 11/11/2019 0508   CL 111 11/11/2019 0508   CO2 16 (L) 11/11/2019 0508   GLUCOSE 93 11/11/2019 0508   BUN 60 (H) 11/11/2019 0508   BUN 36 (H) 08/13/2019 1139   CREATININE 4.03 (H) 11/11/2019 0508   CALCIUM 8.3 (L) 11/11/2019 0508   GFRNONAA 14 (L) 11/11/2019 0508   GFRAA 17 (L) 11/11/2019 0508   CBC    Component Value Date/Time   WBC 5.1 11/11/2019 0508   RBC 2.75 (L) 11/11/2019 0508   HGB 8.3 (L) 11/11/2019 0508   HGB 7.7 (L) 08/13/2019 1139   HCT 25.2 (L) 11/11/2019 0508   HCT 22.3 (L) 08/13/2019 1139   PLT 210 11/11/2019 0508   PLT 392 08/13/2019 1139   MCV 91.6 11/11/2019 0508   MCV 86 08/13/2019 1139   MCH 30.2 11/11/2019 0508   MCHC 32.9 11/11/2019 0508   RDW 13.1 11/11/2019 0508   RDW 14.4 08/13/2019 1139   LYMPHSABS 1.1 11/06/2019 0935   LYMPHSABS 2.0 02/24/2018 1142   MONOABS 0.6 11/06/2019 0935   EOSABS 0.0 11/06/2019 0935   EOSABS 0.1 02/24/2018 1142   BASOSABS 0.0 11/06/2019 0935   BASOSABS 0.0 02/24/2018 1142     Assessment/Plan:  1. AKI/CKD stage 4- following a syncopal episode.  Baseline Scr appears to be 3.3-3.7 and is slowly improving with IVF's.  Will decrease rate to 54ml/hr. 2. Syncope/NSTEMI- possible arrythmia. Cardiology consulted and wants to proceed with cardiac cath, however Scr climbing since admission. 1. BUN/Cr finally reached a peak of 4.34 and now down to 4.03. 2. Plan to proceed with LHC tomorrow if Scr continues to move toward his baseline. 3. Decrease IV NS to 50 ml/hr and follow UOP and Scr 4. Need to minimize contrast exposure and hold diuretics prior to [facility] and cont with gentle IVF's pre and post cath. 3. CKD stage 4- presumably due to DM and followed by Dr. Hollie Salk in our office and baseline Scr 3.3-3.7. 4. HTN- increased hydralazine but BP remains elevated. Cont to follow 5. Fever- Tmax 100.5 last night. Afebrile today. Urine culture + for pseudomonas aeruginosa sensitive to cefepime and ceftaz, cipro. Started on cefepime and follow.  6. Anemia of CKD- started aranesp 11/10/19, iron stores stable.  Donetta Potts, MD [employer] [phone]

## 2019-11-11 NOTE — Progress Notes (Signed)
Pharmacy Antibiotic Note  Dennis Macias is a 69 y.o. male admitted on 11/06/2019 with UTI.  Pharmacy has been consulted for cefepime dosing.  UA positive (WBC>50, mod leukocytes, rare bacteria). Has indwelling catheter. UCx showing pseudomonas aeruginosa. BCx growing GNR with BCID showing pseudomonas. WBC WNL, afebrile. Scr is 4.03 (CrCl 18 mL/min).   Plan: Cefepime 2g IV every 24 hours  Monitor renal fx, cx results, clinical pic  Height: 6' (182.9 cm) Weight: 74.4 kg (164 lb 0.4 oz) IBW/kg (Calculated) : 77.6  Temp (24hrs), Avg:98 F (36.7 C), Min:97.7 F (36.5 C), Max:98.2 F (36.8 C)  Recent Labs  Lab 11/06/19 0935 11/06/19 0935 11/07/19 0318 11/08/19 0445 11/09/19 0403 11/10/19 0349 11/11/19 0508  WBC 13.2*  --  8.7 8.2  --  5.1 5.1  CREATININE 3.84*   < > 3.94* 4.13* 4.34* 4.18* 4.03*   < > = values in this interval not displayed.    Estimated Creatinine Clearance: 18.5 mL/min (A) (by C-G formula based on SCr of 4.03 mg/dL (H)).    No Known Allergies  Antimicrobials this admission: Ceftriaxone 5/2 x1 Cefepime 5/2 >>   Dose adjustments this admission: N/A   Microbiology results: 5/1 BCx: GNR - BCID pseudomonas  5/1 UCx: >100k pseudomonas aeruginosa    Thank you for allowing pharmacy to be a part of this patient's care.  Antonietta Jewel, PharmD, [place] Clinical Pharmacist  Phone: [phone] 11/11/2019 2:43 PM  Please check AMION for all [place] phone numbers After 10:00 PM, call [place] [phone]

## 2019-11-11 NOTE — Care Management Important Message (Signed)
Important Message  Patient Details  Name: Dennis Macias MRN: [id number] Date of Birth: 09-03-1950   Medicare Important Message Given:  Yes     [name] 11/11/2019, 11:05 AM

## 2019-11-11 NOTE — Progress Notes (Signed)
Progress Note  Patient Name: Dennis Macias Date of Encounter: 11/11/2019  Primary Cardiologist: Larae Grooms, MD   Subjective   Blood cultures came back positive for pseudomonas, however no fevers in the last 48 hours. Patient denies chest pain and sob. IVF discontinued yesterday.  Creatinine improving.  Inpatient Medications    Scheduled Meds: . aspirin EC  81 mg Oral Daily  . atorvastatin  80 mg Oral QHS  . carvedilol  6.25 mg Oral BID WC  . cloNIDine  0.1 mg Oral BID  . darbepoetin (ARANESP) injection - NON-DIALYSIS  40 mcg Subcutaneous Q Tue-1800  . dorzolamide-timolol  1 drop Right Eye BID  . ferrous gluconate  324 mg Oral Q breakfast  . finasteride  5 mg Oral Daily  . heparin  5,000 Units Subcutaneous Q8H  . hydrALAZINE  100 mg Oral TID  . insulin aspart  0-9 Units Subcutaneous TID WC  . isosorbide dinitrate  40 mg Oral TID  . sodium bicarbonate  650 mg Oral BID  . tamsulosin  0.4 mg Oral QPC supper  . vitamin B-12  1,000 mcg Oral Daily   Continuous Infusions: . sodium chloride 75 mL/hr at 11/10/19 2004  . ceFEPime (MAXIPIME) IV 2 g (11/10/19 1507)   PRN Meds: acetaminophen **OR** acetaminophen   Vital Signs    Vitals:   11/10/19 0830 11/10/19 1500 11/10/19 1958 11/11/19 0531  BP:  (!) 173/73 (!) 141/66 (!) 175/74  Pulse:  (!) 51 (!) 56 (!) 55  Resp: 19 19 17 19   Temp:  97.7 F (36.5 C) 98.2 F (36.8 C) 98.2 F (36.8 C)  TempSrc:  Oral Oral Oral  SpO2:  98% 97% 96%  Weight:    74.4 kg  Height:        Intake/Output Summary (Last 24 hours) at 11/11/2019 0739 Last data filed at 11/11/2019 0600 Gross per 24 hour  Intake 1779.96 ml  Output 1200 ml  Net 579.96 ml   Last 3 Weights 11/11/2019 11/10/2019 11/09/2019  Weight (lbs) 164 lb 0.4 oz 159 lb 13.3 oz 176 lb 5.9 oz  Weight (kg) 74.4 kg 72.5 kg 80 kg      Telemetry    NSR, HR 50-60, 4 bats NSVT, PVCs - Personally Reviewed  ECG    No new - Personally Reviewed  Physical Exam   GEN: No acute  distress.   Neck: No JVD Cardiac: RRR, no murmurs, rubs, or gallops.  Respiratory: Clear to auscultation bilaterally. GI: Soft, nontender, non-distended  MS: No edema; No deformity. Neuro:  Nonfocal  Psych: Normal affect   Labs    High Sensitivity Troponin:   Recent Labs  Lab 10/14/19 2259 10/15/19 0052 11/06/19 0935 11/06/19 1924 11/06/19 2235  TROPONINIHS 20* 19* 1,618* 2,384* 2,233*      Chemistry Recent Labs  Lab 11/09/19 0403 11/10/19 0349 11/11/19 0508  NA 142 138 140  K 3.6 3.4* 4.1  CL 110 111 111  CO2 21* 19* 16*  GLUCOSE 95 118* 93  BUN 59* 60* 60*  CREATININE 4.34* 4.18* 4.03*  CALCIUM 8.5* 8.2* 8.3*  ALBUMIN  --  1.9* 2.0*  GFRNONAA 13* 14* 14*  GFRAA 15* 16* 17*  ANIONGAP 11 8 13      Hematology Recent Labs  Lab 11/08/19 0445 11/10/19 0349 11/11/19 0508  WBC 8.2 5.1 5.1  RBC 2.71* 2.74* 2.75*  HGB 8.1* 8.2* 8.3*  HCT 24.5* 25.1* 25.2*  MCV 90.4 91.6 91.6  MCH 29.9 29.9 30.2  MCHC 33.1  32.7 32.9  RDW 13.3 13.2 13.1  PLT 193 210 210    BNP Recent Labs  Lab 11/06/19 0935  BNP 4,075.9*     DDimer No results for input(s): DDIMER in the last 168 hours.   Radiology    No results found.  Cardiac Studies   Echo 10/15/19 IMPRESSIONS    1. Left ventricular ejection fraction, by estimation, is 40 to 45%. The  left ventricle has mildly decreased function. The left ventricle  demonstrates regional wall motion abnormalities (see scoring  diagram/findings for description). There is mild  asymmetric left ventricular hypertrophy of the septal segment. Left  ventricular diastolic parameters are consistent with Grade I diastolic  dysfunction (impaired relaxation).  2. Right ventricular systolic function is normal. The right ventricular  size is normal. There is mildly elevated pulmonary artery systolic  pressure.  3. Left atrial size was severely dilated.  4. Moderate pleural effusion.  5. The mitral valve is normal in structure.  Mild mitral valve  regurgitation. No evidence of mitral stenosis.  6. The aortic valve is tricuspid. Aortic valve regurgitation is not  visualized. Mild to moderate aortic valve sclerosis/calcification is  present, without any evidence of aortic stenosis.  7. The inferior vena cava is normal in size with greater than 50%  respiratory variability, suggesting right atrial pressure of 3 mmHg.   Comparison(s): Changes from prior study are noted. The left ventricular  function is worsened.   FINDINGS  Left Ventricle: Left ventricular ejection fraction, by estimation, is 40  to 45%. The left ventricle has mildly decreased function. The left  ventricle demonstrates regional wall motion abnormalities. The left  ventricular internal cavity size was normal  in size. There is mild asymmetric left ventricular hypertrophy of the  septal segment. Left ventricular diastolic parameters are consistent with  Grade I diastolic dysfunction (impaired relaxation).     LV Wall Scoring:  The entire apex is akinetic.   Right Ventricle: The right ventricular size is normal. No increase in  right ventricular wall thickness. Right ventricular systolic function is  normal. There is mildly elevated pulmonary artery systolic pressure. The  tricuspid regurgitant velocity is 2.95  m/s, and with an assumed right atrial pressure of 3 mmHg, the estimated  right ventricular systolic pressure is 45.0 mmHg.   Left Atrium: Left atrial size was severely dilated.   Right Atrium: Right atrial size was normal in size.   Pericardium: Trivial pericardial effusion is present.   Mitral Valve: The mitral valve is normal in structure. Normal mobility of  the mitral valve leaflets. Mild mitral valve regurgitation. No evidence of  mitral valve stenosis.   Tricuspid Valve: The tricuspid valve is normal in structure. Tricuspid  valve regurgitation is not demonstrated. No evidence of tricuspid  stenosis.   Aortic Valve:  The aortic valve is tricuspid. Aortic valve regurgitation is  not visualized. Mild to moderate aortic valve sclerosis/calcification is  present, without any evidence of aortic stenosis.   Patient Profile     69 y.o. male  with past medical history of coronary artery disease, ischemic cardiomyopathy, hypertension, hyperlipidemia, chronic stage IV kidney disease, chronic indwelling Foley catheter, history of CVA for evaluation of syncope. Echocardiogram April 2021 showed ejection fraction 40 to 38%, grade 1 diastolic dysfunction, severe left atrial enlargement, mild mitral regurgitation. Nuclear study April 2021 showed ejection fraction 40%. There was apical infarction with peri-infarct ischemia. Patient has been treated medically due to renal insufficiency and lack of symptoms.  Assessment & Plan  Syncope - unclear etiology.  - Patient is very deconditioned and has had had multiple admission in the last couple months (Feb for nephrotic syndrome, March with anasarca and Rt thoracentesis - Tele with NSVT and PVCs - Hs trop also up in the setting of renal insufficiency but could also have CAD - BC positive for pseudomonas, which was also found in urine  NSTEMI - HS trop up to 2,000 - Recent stress shows ischemia and reduced LV function of40% - Plan for cardiac cath for definitive evaluation once kidney function is at baseline. Creatinine continues to improve, 4.03 today (baseline around 3.5).   CKD stage IV - nephrology following. Patient may require dialysis after cath - lasix held for rising creatinine and patient was given IVF yesterday  - creatinine continues to improve 4.34 > 4.18>4.03 - Baseline around 3.5  Ischemic CM/Acute on chronic Diastolic HF - EF 16% this admission - BNP up to 4075 on admission and patient was started on IV lasix - Lasix held for possible cath and patient was given fluids - continue BB. Cannot titrate for bradycardia. Continue hydralazine, nitrates   HTN - Hydralazine increased for elevated pressures - Metoprolol switched to Coreg 6.25mg  BID. Cannot titrate due to bradycardia, Hr in the 50s - continue Isordil 40mg  TID - Pressure still elevated - started on clonidine 0.1mg  BID  Fever/Bacteremia - BC came back positive with pseudomonas  - IV abx per IM  Anemia - Seeing nephrology for Epogen    For questions or updates, please contact [place] Please consult [email or web address] for contact info under        Signed, Caidence Higashi Ninfa Meeker, PA-C  11/11/2019, 7:39 AM

## 2019-11-11 NOTE — Progress Notes (Signed)
Subjective: Dennis Macias was seen at beside this AM. The patient does not endorse any specific complaints today. He is afebrile and denies symptoms such as chills, dysuria, CP, or SOB. There are no other complaints or concerns at this time.  Objective:  Vital signs in last 24 hours: Vitals:   11/10/19 1958 11/11/19 0531 11/11/19 0818 11/11/19 1111  BP: (!) 141/66 (!) 175/74 (!) 173/70 (!) 183/77  Pulse: (!) 56 (!) 55 65 (!) 57  Resp: 17 19 (!) 25 (!) 23  Temp: 98.2 F (36.8 C) 98.2 F (36.8 C)    TempSrc: Oral Oral    SpO2: 97% 96% 94% 96%  Weight:  74.4 kg    Height:       General: Elderly male, NAD, laying in bed Cardiac: RRR, no m/r/g Pulmonary: CTABL, no wheezing, rhonchi, rales Abdomen: Soft, non-tender, non-distended Neurologic: AAOx1 (to person only) Extremities: No LE edema  Assessment/Plan:  Dennis Macias is a 69 y.o. male with a history of CAD chronic systolic and diastolic heart failure, CKD stage IV, anemia of chronic disease, and BPH with chronic indwelling Foley catheter status post TURP who presented with a syncopal episode that occurred on 11/06/19.   Active Problems:   Syncope and collapse   Syncope   Chronic combined systolic and diastolic heart failure (HCC)   Non-ST elevation (NSTEMI) myocardial infarction [facility])  Syncope: Patient had an episode of syncope that occurred during breakfast on 04/30; no presyncopal times or seizure activity noted. Reported increase in his home BP medications on 04/29 and he had just taken his medications prior to the episode. Labs were significant for troponin elevation of 1600 > 2300 > 2200. Differential includes arrhythmia; cardiology is on board and planning catheterization although his renal function makes this challenging given risk of contrast nephropathy.  -Cardiology following, appreciate recommendations -Cardiology to proceed with left heart cath (likely tomorrow if Scr continues to improve), but they will hold off for now  given increased Scr (4.03 today; baseline of 3.5) -Continue telemetry -Continue home BP medications  UA showing pyuria; urine cultures positive for Pseudomonas 1/4 blood cultures positive for Pseudomonas  Over the weekend, the patient developed fevers, chills, and some diaphoresis. He is otherwise asymptomatic and is currently afebrile. The exact etiology is unclear at this time, though his indwelling catheter could be a potential source of infection. He is on day 4 of cefepime regimen for urine cultures positive for cefepime-sensitive Pseudomonas species. Yesterday, 1/4 blood cultures grew Pseudomonas species which were sensitive to cefepime. Therefore, we will continue his cefepime regimen at this time and continue to closely monitor. -Continue cefepime regimen, currently on day 4 of 7-10. -Tylenol for fevers  -Repeat urine culture pending  CKD stage IV: Creatinine on admission was 3.84, near his baseline. Creatinine has been 3.84>>3.94>>4.12>>4.34>>4.18>>4.03 today. Nephrology is currently on board. His rising creatinine could be the result of pre-renal (prior Lasix regimen vs. CKD progression 2/2 DM) and post-renal AKI 2/2 BPH. On 05/02, bladder scan showed 400 mL but an accurate PVR was unable to be obtained; subsequent bladder scan showed 313 mL. Repeat bladder scan today was 267. -Nephrology consulted; appreciate their recs -Continue IV NS at 50ml/hr; will trend UOP and Scr -Sodium bicarbonate  Hypertension: -Patient recently had his hydralazine and isosorbide medications increased at home before his syncopal episode. However, the patient remains hypertensive, and we will continue current BP regimen. -Hydralazine 100 mg PO TID  -Isordil, 40 mg PO TID -Clonidine 0.1 mg PO BID  Chronic systolic and diastolic heart failure: BNP on admission elevated to>4000. Was on Lasix initially, but this was recently discontinued given climbing Scr levels. He is euvolemic on examination today and had  adequate urine output of 500 mL in the past 24 hours. Last recorded dry weight was 80.9 kg in Feb 2021, although his weight was recorded at 70.3>> 80 kg>>80 kg>> 72.5>>74.4 kg today. Will continue to monitor his urine output and weight changes during his admission. -Holding home Lasix -Strict I's/O's -Daily weights  Anemia of chronic disease: -Continue ferrous gluconate -Aranesp per nephrology.  Prior to Admission Living Arrangement: Home Anticipated Discharge Location: TBD Barriers to Discharge: Pending further evalaution Dispo: Anticipated discharge in approximately 1-2 day(s).   Dennis Macias, Medical Student 11/11/2019, 12:56 PM Pager: [phone]

## 2019-11-12 ENCOUNTER — Ambulatory Visit: Payer: Medicare PPO

## 2019-11-12 ENCOUNTER — Inpatient Hospital Stay (HOSPITAL_COMMUNITY): Payer: Medicare PPO

## 2019-11-12 ENCOUNTER — Encounter (HOSPITAL_COMMUNITY)
Admission: EM | Disposition: A | Discharge status: Rehabilitation Facility | Payer: Self-pay | Source: Home / Self Care | Attending: Internal Medicine

## 2019-11-12 DIAGNOSIS — R4182 Altered mental status, unspecified: Secondary | ICD-10-CM

## 2019-11-12 DIAGNOSIS — G9340 Encephalopathy, unspecified: Secondary | ICD-10-CM

## 2019-11-12 LAB — CBC
HCT: 25.9 % — ABNORMAL LOW (ref 39.0–52.0)
Hemoglobin: 8.2 g/dL — ABNORMAL LOW (ref 13.0–17.0)
MCH: 29.1 pg (ref 26.0–34.0)
MCHC: 31.7 g/dL (ref 30.0–36.0)
MCV: 91.8 fL (ref 80.0–100.0)
Platelets: 218 10*3/uL (ref 150–400)
RBC: 2.82 MIL/uL — ABNORMAL LOW (ref 4.22–5.81)
RDW: 13 % (ref 11.5–15.5)
WBC: 4.9 10*3/uL (ref 4.0–10.5)
nRBC: 0 % (ref 0.0–0.2)

## 2019-11-12 LAB — RENAL FUNCTION PANEL
Albumin: 1.9 g/dL — ABNORMAL LOW (ref 3.5–5.0)
Anion gap: 8 (ref 5–15)
BUN: 57 mg/dL — ABNORMAL HIGH (ref 8–23)
CO2: 16 mmol/L — ABNORMAL LOW (ref 22–32)
Calcium: 8.2 mg/dL — ABNORMAL LOW (ref 8.9–10.3)
Chloride: 117 mmol/L — ABNORMAL HIGH (ref 98–111)
Creatinine, Ser: 3.9 mg/dL — ABNORMAL HIGH (ref 0.61–1.24)
GFR calc Af Amer: 17 mL/min — ABNORMAL LOW (ref >60)
GFR calc non Af Amer: 15 mL/min — ABNORMAL LOW (ref >60)
Glucose, Bld: 104 mg/dL — ABNORMAL HIGH (ref 70–99)
Phosphorus: 4 mg/dL (ref 2.5–4.6)
Potassium: 4 mmol/L (ref 3.5–5.1)
Sodium: 141 mmol/L (ref 135–145)

## 2019-11-12 LAB — CULTURE, BLOOD (ROUTINE X 2)
Culture: NO GROWTH
Special Requests: ADEQUATE
Special Requests: ADEQUATE

## 2019-11-12 LAB — COMPREHENSIVE METABOLIC PANEL
ALT: 10 U/L (ref 0–44)
AST: 12 U/L — ABNORMAL LOW (ref 15–41)
Albumin: 1.9 g/dL — ABNORMAL LOW (ref 3.5–5.0)
Alkaline Phosphatase: 42 U/L (ref 38–126)
Anion gap: 12 (ref 5–15)
BUN: 56 mg/dL — ABNORMAL HIGH (ref 8–23)
CO2: 16 mmol/L — ABNORMAL LOW (ref 22–32)
Calcium: 8.5 mg/dL — ABNORMAL LOW (ref 8.9–10.3)
Chloride: 115 mmol/L — ABNORMAL HIGH (ref 98–111)
Creatinine, Ser: 3.94 mg/dL — ABNORMAL HIGH (ref 0.61–1.24)
GFR calc Af Amer: 17 mL/min — ABNORMAL LOW (ref >60)
GFR calc non Af Amer: 15 mL/min — ABNORMAL LOW (ref >60)
Glucose, Bld: 108 mg/dL — ABNORMAL HIGH (ref 70–99)
Potassium: 4 mmol/L (ref 3.5–5.1)
Sodium: 143 mmol/L (ref 135–145)
Total Bilirubin: 1.1 mg/dL (ref 0.3–1.2)
Total Protein: 5.1 g/dL — ABNORMAL LOW (ref 6.5–8.1)

## 2019-11-12 LAB — GLUCOSE, CAPILLARY
Glucose-Capillary: 97 mg/dL (ref 70–99)
Glucose-Capillary: 97 mg/dL (ref 70–99)

## 2019-11-12 SURGERY — LEFT HEART CATH AND CORONARY ANGIOGRAPHY
Anesthesia: LOCAL

## 2019-11-12 MED ORDER — ORAL CARE MOUTH RINSE
15.0000 mL | Freq: Two times a day (BID) | OROMUCOSAL | Status: DC
  Administered 2019-11-12 – 2019-11-25 (×21): 15 mL via OROMUCOSAL

## 2019-11-12 MED ORDER — HYDRALAZINE HCL 20 MG/ML IJ SOLN
5.0000 mg | INTRAMUSCULAR | Status: DC | PRN
  Administered 2019-11-12 (×3): 5 mg via INTRAVENOUS
  Filled 2019-11-12: qty 1

## 2019-11-12 MED ORDER — SODIUM CHLORIDE 0.9 % IV SOLN: 2.0000 g | INTRAVENOUS | Status: DC

## 2019-11-12 MED ORDER — METOPROLOL TARTRATE 5 MG/5ML IV SOLN: 5.0000 mg | Freq: Four times a day (QID) | INTRAVENOUS | Status: DC | PRN

## 2019-11-12 MED ORDER — HYDRALAZINE HCL 20 MG/ML IJ SOLN
5.0000 mg | INTRAMUSCULAR | Status: DC | PRN
  Administered 2019-11-12: 5 mg via INTRAVENOUS
  Filled 2019-11-12 (×2): qty 1

## 2019-11-12 MED ORDER — STERILE WATER FOR INJECTION IV SOLN
INTRAVENOUS | Status: DC
  Filled 2019-11-12 (×8): qty 850

## 2019-11-12 MED ORDER — HYDRALAZINE HCL 20 MG/ML IJ SOLN
10.0000 mg | INTRAMUSCULAR | Status: DC | PRN
  Administered 2019-11-12: 10 mg via INTRAVENOUS
  Filled 2019-11-12: qty 1

## 2019-11-12 MED ORDER — NITROGLYCERIN IN D5W 200-5 MCG/ML-% IV SOLN
0.0000 ug/min | INTRAVENOUS | Status: DC
  Administered 2019-11-12: 5 ug/min via INTRAVENOUS
  Administered 2019-11-14: 45 ug/min via INTRAVENOUS
  Administered 2019-11-15: 30 ug/min via INTRAVENOUS
  Filled 2019-11-12 (×4): qty 250

## 2019-11-12 MED ORDER — SODIUM CHLORIDE 0.9 % IV SOLN
2.0000 g | INTRAVENOUS | Status: AC
  Administered 2019-11-12 – 2019-11-14 (×3): 2 g via INTRAVENOUS
  Filled 2019-11-12 (×3): qty 2

## 2019-11-12 NOTE — Progress Notes (Signed)
Pt having hypertension with SBP 190-200 this evening. Nitro drip started per cardiology. Neurology note from today still pending. Discussed permissive hypertension parameters with Dr. Rory Percy given acute infarct on MRI this afternoon and LKN yesterday. Recommended goal SBP between 140-222. Followed up with cardiology, Dr. Marletta Lor, who advised continuation of nitro titrated to SBP of 160-180 for a balance of neuro and cardioprotective effects given pt's trop elevation on admission and know ischemia on nuclear scan in April. Followed up with RN, Alleen Borne, to communicate SBP goals. RN confirmed pt currently on 45mcg/min with SBP 192.  - continue IV nitro with goal SBP 160-180 - discontinued PRN IV hydral  - holding evening dose of PO hydral and isosorbide given AMS

## 2019-11-12 NOTE — Consult Note (Addendum)
Referring Physician: Dr. Daryll Drown    Chief Complaint: Global aphasia  HPI: Dennis Macias is an 69 y.o. male with a complicated ischemic cardiomyopathy, chronic diastolic heart failure, stage 4 kidney disease, DM, HTN and prior stroke who was admitted on 4/30 after a syncopal episode that morning. He was diagnosed with an NSTEMI and admitted to the Cardiology service. He also was worked up for infection in the setting of fever and blood culture came back positive for pseudomonas.   Starting yesterday, he was noted to experience gradual decrease in his mentation and level of alertness.  Initially awake, alert and conversant on Wednesday AM, he began to exhibit progressive lethargy throughout the day, with confusion manifesting in one case with him continuing to chew on a piece of chicken during lunch and eventually spitting it out. A slight worsening of his hypercarbia was noted at that time. Difficulty with word expression, disorientation to place and situation, some tremors in his bilateral upper extremities and possible myoclonus in his RLE were also noted. At about 11 PM on Wednesday, a right facial droop was noted during his sleep - he was aroused and noted to have continued disorientation, but with resolution of the facial droop. Assessment at that time favored toxic encephalopathy from infection versus a delirium. This AM the patient was noted to be aphasic and not following instructions. Code Stroke was called.    LSN: 1000 Wednesday tPA Given: No: Out of time window.   Past Medical History:  Diagnosis Date  . Anemia   . Arthritis    past hx   . Blindness    right eye  . Cataract    removed both eyes  . Dehydration   . Diabetes ([place])   . Glaucoma   . History of CVA (cerebrovascular accident) 09/13/2015  . History of urinary retention   . Hyperlipidemia   . Hypertension   . Pernicious anemia 02/24/2018  . Stroke [facility])    2017- March  . Syncope 11/2019  . Tachycardia 08/26/2017  . Tubular  adenoma of colon 02/2017  . Weight loss, non-intentional 08/26/2017   10 lbs between 6/18 & 2/19    Past Surgical History:  Procedure Laterality Date  . CATARACT EXTRACTION, BILATERAL    . COLONOSCOPY    . IR THORACENTESIS ASP PLEURAL SPACE W/IMG GUIDE  09/21/2019  . IR THORACENTESIS ASP PLEURAL SPACE W/IMG GUIDE  10/16/2019  . POLYPECTOMY    . REFRACTIVE SURGERY  10/2017  . TEE WITHOUT CARDIOVERSION N/A 09/14/2015   Procedure: TRANSESOPHAGEAL ECHOCARDIOGRAM (TEE);  Surgeon: Larey Dresser, MD;  Location: [place];  Service: Cardiovascular;  Laterality: N/A;  . TRANSURETHRAL RESECTION OF PROSTATE N/A 10/20/2019   Procedure: TRANSURETHRAL RESECTION OF THE PROSTATE (TURP);  Surgeon: Irine Seal, MD;  Location: [facility];  Service: Urology;  Laterality: N/A;  . UPPER GASTROINTESTINAL ENDOSCOPY      Family History  Problem Relation Age of Onset  . Hypertension Mother   . Hyperlipidemia Mother   . Hyperlipidemia Father   . Colon cancer Neg Hx   . Colon polyps Neg Hx   . Esophageal cancer Neg Hx   . Rectal cancer Neg Hx   . Stomach cancer Neg Hx    Social History:  reports that he has quit smoking. He quit smokeless tobacco use about 41 years ago.  His smokeless tobacco use included chew. He reports that he does not drink alcohol or use drugs.  Allergies: No Known Allergies  Medications:  Scheduled: .  aspirin EC  81 mg Oral Daily  . atorvastatin  80 mg Oral QHS  . carvedilol  6.25 mg Oral BID WC  . darbepoetin (ARANESP) injection - NON-DIALYSIS  40 mcg Subcutaneous Q Tue-1800  . dorzolamide-timolol  1 drop Right Eye BID  . ferrous gluconate  324 mg Oral Q breakfast  . finasteride  5 mg Oral Daily  . heparin  5,000 Units Subcutaneous Q8H  . hydrALAZINE  100 mg Oral TID  . isosorbide dinitrate  40 mg Oral TID  . mouth rinse  15 mL Mouth Rinse BID  . sodium chloride flush  3 mL Intravenous Q12H  . tamsulosin  0.4 mg Oral QPC supper  . vitamin B-12  1,000 mcg Oral Daily    Continuous: . sodium chloride    . sodium chloride    . cefTAZidime (FORTAZ)  IV Stopped (11/12/19 1449)  . nitroGLYCERIN 40 mcg/min (11/12/19 2107)  .  sodium bicarbonate (isotonic) infusion in sterile water 75 mL/hr at 11/12/19 1800    ROS: Unable to obtain due to AMS.   Physical Examination: Blood pressure (!) 194/75, pulse (!) 56, temperature 99.1 F (37.3 C), temperature source Axillary, resp. rate 18, height 6' (1.829 m), weight 72.3 kg, SpO2 95 %.   HEENT: /AT Lungs: Respirations unlabored Ext: No edema  Neurologic Examination: Mental Status: Awake with decreased level of alertness. Not answering questions except with very brief replies related to how he is feeling. Not following commands. Will gaze briefly at examiner. Will murmur "ow", grimace and localize to sternal rub. [place]. Non-agitated.  Cranial Nerves: II:  Will track and initiate saccades to the left and right. Will fixate on examiners' faces. PERRL.  III,IV, VI: EOMI. No nystagmus.  V,VII: Face symmetric, reacts to tactile stimulation bilaterally VIII: hearing intact to voice IX,X: Hypophonic speech XI: Head midline XII: Does not protrude tongue to command Motor/Sensory: Moves upper extremities purposefully and with full strength to sternal rub, but will not move to command. No asymmetry.  Withdraws BLE briskly and symmetrically to noxious plantar stimulation.  Deep Tendon Reflexes:  Normoactive throughout.  Plantars: Right: downgoing   Left: downgoing Cerebellar: Unable to assess formally, but no gross ataxia noted.  Gait: Unable to assess.   Results for orders placed or performed during the hospital encounter of 11/06/19 (from the past 48 hour(s))  Glucose, capillary     Status: Abnormal   Collection Time: 11/10/19 11:58 AM  Result Value Ref Range   Glucose-Capillary 116 (H) 70 - 99 mg/dL    Comment: Glucose reference range applies only to samples taken after fasting for at least 8 hours.   Glucose, capillary     Status: Abnormal   Collection Time: 11/10/19  3:51 PM  Result Value Ref Range   Glucose-Capillary 108 (H) 70 - 99 mg/dL    Comment: Glucose reference range applies only to samples taken after fasting for at least 8 hours.  Glucose, capillary     Status: Abnormal   Collection Time: 11/10/19  9:08 PM  Result Value Ref Range   Glucose-Capillary 102 (H) 70 - 99 mg/dL    Comment: Glucose reference range applies only to samples taken after fasting for at least 8 hours.  Renal function panel     Status: Abnormal   Collection Time: 11/11/19  5:08 AM  Result Value Ref Range   Sodium 140 135 - 145 mmol/L   Potassium 4.1 3.5 - 5.1 mmol/L   Chloride 111 98 - 111 mmol/L  CO2 16 (L) 22 - 32 mmol/L   Glucose, Bld 93 70 - 99 mg/dL    Comment: Glucose reference range applies only to samples taken after fasting for at least 8 hours.   BUN 60 (H) 8 - 23 mg/dL   Creatinine, Ser 4.03 (H) 0.61 - 1.24 mg/dL   Calcium 8.3 (L) 8.9 - 10.3 mg/dL   Phosphorus 3.9 2.5 - 4.6 mg/dL   Albumin 2.0 (L) 3.5 - 5.0 g/dL   GFR calc non Af Amer 14 (L) >60 mL/min   GFR calc Af Amer 17 (L) >60 mL/min   Anion gap 13 5 - 15    Comment: Performed at [facility] [address] [place], [place] [address]  CBC     Status: Abnormal   Collection Time: 11/11/19  5:08 AM  Result Value Ref Range   WBC 5.1 4.0 - 10.5 K/uL   RBC 2.75 (L) 4.22 - 5.81 MIL/uL   Hemoglobin 8.3 (L) 13.0 - 17.0 g/dL   HCT 25.2 (L) 39.0 - 52.0 %   MCV 91.6 80.0 - 100.0 fL   MCH 30.2 26.0 - 34.0 pg   MCHC 32.9 30.0 - 36.0 g/dL   RDW 13.1 11.5 - 15.5 %   Platelets 210 150 - 400 K/uL   nRBC 0.0 0.0 - 0.2 %    Comment: Performed at [facility], [place] [address], [place], [place] [address]  Glucose, capillary     Status: None   Collection Time: 11/11/19  7:37 AM  Result Value Ref Range   Glucose-Capillary 87 70 - 99 mg/dL    Comment: Glucose reference range applies only to samples taken after fasting for at least  8 hours.  Glucose, capillary     Status: None   Collection Time: 11/11/19  1:27 PM  Result Value Ref Range   Glucose-Capillary 92 70 - 99 mg/dL    Comment: Glucose reference range applies only to samples taken after fasting for at least 8 hours.  Lactic acid, plasma     Status: None   Collection Time: 11/11/19  2:44 PM  Result Value Ref Range   Lactic Acid, Venous 0.5 0.5 - 1.9 mmol/L    Comment: Performed at [facility], [place] [address] [place], [place] [address]  Renal function panel     Status: Abnormal   Collection Time: 11/11/19  2:44 PM  Result Value Ref Range   Sodium 140 135 - 145 mmol/L   Potassium 4.0 3.5 - 5.1 mmol/L   Chloride 113 (H) 98 - 111 mmol/L   CO2 16 (L) 22 - 32 mmol/L   Glucose, Bld 108 (H) 70 - 99 mg/dL    Comment: Glucose reference range applies only to samples taken after fasting for at least 8 hours.   BUN 58 (H) 8 - 23 mg/dL   Creatinine, Ser 3.88 (H) 0.61 - 1.24 mg/dL   Calcium 8.0 (L) 8.9 - 10.3 mg/dL   Phosphorus 4.0 2.5 - 4.6 mg/dL   Albumin 2.0 (L) 3.5 - 5.0 g/dL   GFR calc non Af Amer 15 (L) >60 mL/min   GFR calc Af Amer 17 (L) >60 mL/min   Anion gap 11 5 - 15    Comment: Performed at [facility] [address] [place], [place] [address]  Blood gas, arterial     Status: Abnormal   Collection Time: 11/11/19  4:11 PM  Result Value Ref Range   FIO2 21.00  pH, Arterial 7.364 7.350 - 7.450   pCO2 arterial 29.2 (L) 32.0 - 48.0 mmHg   pO2, Arterial 76.3 (L) 83.0 - 108.0 mmHg   Bicarbonate 16.3 (L) 20.0 - 28.0 mmol/L   Acid-base deficit 8.1 (H) 0.0 - 2.0 mmol/L   O2 Saturation 95.4 %   Patient temperature 36.8    Collection site LEFT BRACHIAL    Drawn by [id number]    Sample type ARTERIAL    Allens test (pass/fail) PASS PASS    Comment: Performed at [facility], [place] [address] [place], [place] [address]  Lactic acid, plasma     Status: None   Collection Time: 11/11/19  5:47 PM  Result Value Ref Range   Lactic Acid, Venous  0.6 0.5 - 1.9 mmol/L    Comment: Performed at [facility] [address], [place], [place] [address]  Glucose, capillary     Status: None   Collection Time: 11/11/19  9:10 PM  Result Value Ref Range   Glucose-Capillary 99 70 - 99 mg/dL    Comment: Glucose reference range applies only to samples taken after fasting for at least 8 hours.  Renal function panel     Status: Abnormal   Collection Time: 11/12/19  3:54 AM  Result Value Ref Range   Sodium 141 135 - 145 mmol/L   Potassium 4.0 3.5 - 5.1 mmol/L   Chloride 117 (H) 98 - 111 mmol/L   CO2 16 (L) 22 - 32 mmol/L   Glucose, Bld 104 (H) 70 - 99 mg/dL    Comment: Glucose reference range applies only to samples taken after fasting for at least 8 hours.   BUN 57 (H) 8 - 23 mg/dL   Creatinine, Ser 3.90 (H) 0.61 - 1.24 mg/dL   Calcium 8.2 (L) 8.9 - 10.3 mg/dL   Phosphorus 4.0 2.5 - 4.6 mg/dL   Albumin 1.9 (L) 3.5 - 5.0 g/dL   GFR calc non Af Amer 15 (L) >60 mL/min   GFR calc Af Amer 17 (L) >60 mL/min   Anion gap 8 5 - 15    Comment: Performed at [facility] [address] [place], [place] [address]  CBC     Status: Abnormal   Collection Time: 11/12/19  3:54 AM  Result Value Ref Range   WBC 4.9 4.0 - 10.5 K/uL   RBC 2.82 (L) 4.22 - 5.81 MIL/uL   Hemoglobin 8.2 (L) 13.0 - 17.0 g/dL   HCT 25.9 (L) 39.0 - 52.0 %   MCV 91.8 80.0 - 100.0 fL   MCH 29.1 26.0 - 34.0 pg   MCHC 31.7 30.0 - 36.0 g/dL   RDW 13.0 11.5 - 15.5 %   Platelets 218 150 - 400 K/uL   nRBC 0.0 0.0 - 0.2 %    Comment: Performed at [facility], [place] [address] [place], [place] [address]  Glucose, capillary     Status: None   Collection Time: 11/12/19  7:50 AM  Result Value Ref Range   Glucose-Capillary 97 70 - 99 mg/dL    Comment: Glucose reference range applies only to samples taken after fasting for at least 8 hours.   CT HEAD WO CONTRAST  Result Date: 11/11/2019 CLINICAL DATA:  Neuro deficit. Stroke suspected. EXAM: CT HEAD WITHOUT CONTRAST  TECHNIQUE: Contiguous axial images were obtained from the base of the skull through the vertex without intravenous contrast. COMPARISON:  07/31/2019 FINDINGS: Brain: No evidence of acute infarction, hemorrhage, hydrocephalus,  extra-axial collection or mass lesion/mass effect. Atrophy and advanced chronic microvascular ischemic changes are again noted. Vascular: No hyperdense vessel or unexpected calcification. Skull: Normal. Negative for fracture or focal lesion. Sinuses/Orbits: No acute finding. Other: None. IMPRESSION: 1. No acute intracranial abnormality. 2. Atrophy and advanced chronic microvascular ischemic changes. Electronically Signed   By: Constance Holster M.D.   On: 11/11/2019 15:15    Assessment: 69 y.o. male with progressive decline in cognitive function over the past 24 hours.  1. Exam reveals global aphasia, with no focal motor weakness, no asymmetry of responses to noxious stimuli, no facial droop and no ataxia. Notes reveal that his speech deficits and ability to interact have been gradually progressive since last seen normal yesterday at 10 AM. Overall clinical presentation is not consistent with LVO.  2. Overall clinical picture is most consistent with a toxic or metabolic encephalopathy. BUN/Cr is 57/3.9 and his severely reduced eGFR would predispose to possible over-sedation, but he is not drowsy and is not currently on any meds with strong sedating effects. He is on cefepime, so cefepime-related neurotoxicity is relatively high on the DDx. No nuchal rigidity, fever or elevated white count to suggest a meningitis.  3. Vitamin B12 was 1100 in January.   4. TSH level was high in March. Hypothyroidism could be contributing to susceptibility to an acute encephalopathy, but doubt that this would be the sole etiology.   Recommendations: 1. Thiamine level, repeat TSH level, RPR, ESR, C-reactive protein, ammonia level.  2. Pharmacy consult for switch to an alternative to cefepime.  3. MRI  and MRA of the brain without contrast 4. Continue ASA  5. EEG (ordered)  Addendum: EEG Findings:  - Continuous slow, generalized  - Triphasic waves, generalized, maximal bifrontal EEG Impression: This study is suggestive of moderate diffuse encephalopathy, nonspecific etiology but likely secondary to toxic-metabolic causes. No seizures or definite epileptiform discharges were seen throughout the recording.  MRI brain (significantly motion degraded examination):   -- 10 mm acute infarct within the left parietal lobe subcortical white matter. -- Advanced chronic small vessel ischemic changes most notably within the cerebral white matter, progressed as compared to MRI 09/12/2015. A chronic left thalamic lacunar infarct was not present on this prior exam. -- Stable, mild generalized parenchymal atrophy. -- Minimal ethmoid and maxillary sinus mucosal thickening.  Electronically signed: Dr. Kerney Elbe  11/12/2019, 8:39 AM

## 2019-11-12 NOTE — Progress Notes (Signed)
Noted patient has rt.facial droop while asleep.Assesed pt.with the charge nurse .Patient is able to tell his name "Dennis Macias",but unable to say where he is & the date.He is also able to follow commands.V/S taken B/P=177/77 ;HR-60;R=19;O2 sat =96% RA. Notified MD on call & said she will be coming to see pt.

## 2019-11-12 NOTE — Progress Notes (Signed)
Paged to the bedside by overnight RN for concern for facial drop while pt was sleeping. Upon arrival to the bedside, pt awake though sleepy. Oriented to person only. Repeats "Dennis Macias" or "[name]" when asked further orientation questions. Intermittently following commands before falling asleep. No facial droop appreciated. Pt with symmetric smile, eyebrow raise, and nasolabial folds. EOMI grossly intact. Moving all extremities spontaneously. Strength testing limited due to inability to follow commands. Will defer MRI imaging at this time given reassuring exam. Favor toxic encephalopathy in this pt from infection vs delirium. Spoke to RN who will continue to monitor pt for any acute changes or focal deficits.

## 2019-11-12 NOTE — Code Documentation (Signed)
69 yo male on [facility] with complaints of confusion and not talking. Originally admitted on 4/30 due to syncopal episode and noted to have NSTEMI per note. Pt was noted to be at his baseline yesterday morning (5/5) upon resident rounding at 1000. In the afternoon, the Cardiology PA evaluated the patient and he was noted to be slightly confused. Throughout the night, he did follow some commands and could answer his name, but around 0100 he was noted to only say "Milbert Coulter" and "ow" per MD Ronnald Ramp. At 0700 this morning, PA Furth with Cardiology was rounding and noted that patient would not follow any commands or speak. She activated a Code Stroke.   Stroke Team arrived to bedside and evaluated patient. Bilateral withdrawing from pain, no noted facial droop, visual change (blind in the right eye at baseline) or gaze. Pt was alert, but would not say anything but "ow" during exam. NIHSS 16 due to not following commands, not speaking, and bilateral arms and legs weakness secondary to not being cooperative with exam.   Pt is outside the window for tPA and had a CT Head yesterday afternoon for similar symptoms. CT was negative. Pt is scheduled for a cardiac cath today. Symptoms and exam not consistent with stroke, but favors encephalopathy per MD Lindzen. Pt to have metabolic work-up. Code Stroke cancelled.

## 2019-11-12 NOTE — Progress Notes (Signed)
Subjective: [name] was seen at beside this AM. He was lying comfortably in bed but appears more confused compared to the day prior. He has difficulty following commands, responds to questions with "oh," and is not oriented to person, place, or year. There are no other complaints or concerns at this time.  Objective:  Vital signs in last 24 hours: Vitals:   11/12/19 0835 11/12/19 0936 11/12/19 0945 11/12/19 1036  BP: (!) 189/81 (!) 184/80 (!) 186/80 (!) 181/76  Pulse: (!) 53 (!) 55 (!) 55 (!) 53  Resp: 17 16 20 18   Temp:      TempSrc:      SpO2: 98% 95% 96% 96%  Weight:      Height:       General: Elderly male, NAD, laying in bed Cardiac: RRR, no m/r/g Pulmonary: CTABL, no wheezing, rhonchi, rales Abdomen: Soft, non-tender, non-distended Neurologic: Confused, not alert to person, place, or year. Exam was limited to patient having difficulty following commands; unable to assess CN function or strength. However, there is a mild tremor noted in the LUE. Extremities: No LE edema  CT Head W/O Contrast, 05/05: IMPRESSION: 1. No acute intracranial abnormality. 2. Atrophy and advanced chronic microvascular ischemic changes.  MRI Brain WO Contrast, 05/06: IMPRESSION: 1. Significantly motion degraded examination as described. 2. 10 mm acute infarct within the left parietal lobe subcortical white matter. 3. Advanced chronic small vessel ischemic changes most notably within the cerebral white matter, progressed as compared to MRI 09/12/2015. A chronic left thalamic lacunar infarct was not present on this prior exam. 4. Stable, mild generalized parenchymal atrophy. 5. Minimal ethmoid and maxillary sinus mucosal thickening.    Assessment/Plan:  Dennis Macias is a 69 y.o. male with a history of CAD chronic systolic and diastolic heart failure, CKD stage IV, anemia of chronic disease, and BPH with chronic indwelling Foley catheter status post TURP who presented with a syncopal episode  that occurred on 11/06/19, now with acute onset confusion since 05/05, with recent MRI consistent with ischemic infarct of the parietal region.  Active Problems:   Syncope and collapse   Syncope   Chronic combined systolic and diastolic heart failure (HCC)   Non-ST elevation (NSTEMI) myocardial infarction [facility])   Encephalopathies   Altered Mental Status: The patient has been progressively lethargic and confused since 05/05 AM. The patient was reassessed in the afternoon, and workup for various causes was pursued.   Ischemic stroke: The patient has a history of CVA, had possible weakness on exam, and concern for facial droop overnight (though this was not appreciated on repeat exam). Therefore, CT noncontrast of the head was performed on 05/05; this was unremarkable for acute intracranial abnormality. This morning, the patient was evaluated by Cardiology, and a code stroke was called. This was called off as the patient's physical exam was reassuring. After our evaluation this morning, we ordered an MRI given our concern for hypertensive encephalopathy. MRI today revealed an acute infarct in the left parietal region. Therefore, we have consulted Neurology for initiation of stroke workup.  Other potential though less likely causes of his altered mental status include toxic encephalopathy, delirium, uremia, increase in sodium bicarbonate therapy, cefepime toxicity, and clonidine side effect. Toxic encephalopathy was considered as the patient is being treated for a recent UTI and 1/4 blood cultures positive for Pseudomonas, on day 5 of cefepime. However, the patient has been on adequate antibiotic coverage. His lactic acid levels were normal making a secondary infection or Type  B LA less likely. Uremia is also on our differential given his bicarb of 16 and presence of tremor on exam. However, his renal function has improved over the past few days while his mental status has declined. Also, his ABG was  abnormal but his pH has been appropriately compensated. The patient's sodium bicarb regimen was increased yesterday, though this was during the same time as our evaluation yesterday when he was already starting to become confused. Lastly, given that confusion is a side effect of clonidine and cefepime therapies, we have transitioned him off of these medications.  -Neurology on board, appreciate their recs -EEG pending -Transitioned from Cefepime to Ceftazidime today -DC clonidine regimen; now on hydralazine IV per cardiology -LR discontinued today; bicarb to be given through peripheral line -Hold PO meds this morning in setting of AMS   Syncope: Patient had an episode of syncope that occurred during breakfast on 04/30; no presyncopal times or seizure activity noted. Reported increase in his home BP medications on 04/29 and he had just taken his medications prior to the episode. Labs were significant for troponin elevation of 1600 > 2300 > 2200. Differential includes arrhythmia; cardiology is on board and planning catheterization although his renal function makes this challenging given risk of contrast nephropathy.  -Cardiology following, appreciate recommendations -Cardiology recommends a left heart cath, but they were unable to obtain consent this morning due to patient's confused state; will hold off for now until patient is stable. -Continue telemetry -Transitioned to IV hydralazine per cardiology, otherwise continue current BP regimens.   UA showing pyuria; urine cultures positive for Pseudomonas 1/4 blood cultures positive for Pseudomonas  Over the weekend, the patient developed fevers, chills, and some diaphoresis. He is otherwise asymptomatic and is currently afebrile. The exact etiology is unclear at this time, though his indwelling catheter could be a potential source of infection. He is on day 4 of cefepime regimen for urine cultures positive for cefepime-sensitive Pseudomonas species.  Yesterday, 1/4 blood cultures grew Pseudomonas species which were sensitive to cefepime and ceftazidime. The patient was transitioned to Ceftazidime regimen from cefepime today, given that confusion is a potential adverse effect of cefepime. He is on day 5 of antibiotic treatment. -Ceftazidime, on day 5 of antibiotic regimen. -Tylenol for fevers  -Repeat urine culture pending   CKD stage IV: Creatinine on admission was 3.84, near his baseline. Creatinine has been 3.84>>3.94>>4.12>>4.34>>4.18>>4.03>>3.90>>3.94 today. Nephrology is currently on board. His recently rising creatinine could be the result of pre-renal (prior Lasix regimen vs. CKD progression 2/2 DM) and post-renal AKI 2/2 BPH. On 05/02, bladder scan showed 400 mL but an accurate PVR was unable to be obtained; subsequent bladder scan showed 313 mL. Repeat bladder scan yesterday was 36. -Nephrology consulted; appreciate their recs -Sodium bicarbonate  Hypertension: Patient recently had his hydralazine and isosorbide medications increased at home before his syncopal episode. However, the patient remains hypertensive at this time. -Have held oral meds this AM in the setting of AMS -Continue IV hydralazine q4h PRN  Chronic systolic and diastolic heart failure: BNP on admission elevated to>4000. Was on Lasix initially, but this was recently discontinued given climbing Scr levels. He is euvolemic on examination today and had adequate urine output of 500 mL in the past 24 hours. Last recorded dry weight was 80.9 kg in Feb 2021, although his weight was recorded at 70.3>> 80 kg>>80 kg>> 72.5>>74.4kg >>72.3 kg today. Will continue to monitor his urine output and weight changes during his admission. -Holding home Lasix -  Continue beta blocker regimen -Strict I's/O's -Daily weights  Anemia of chronic disease: -Continue ferrous gluconate -Aranesp per nephrology  Prior to Admission Living Arrangement: Home Anticipated Discharge Location:  TBD Barriers to Discharge: Pending further evalaution  Orvis Brill, Medical Student 11/12/2019, 12:34 PM Pager: [phone]

## 2019-11-12 NOTE — Progress Notes (Signed)
Patient having EEG at this time.

## 2019-11-12 NOTE — Progress Notes (Signed)
Patient ID: Dennis Macias, male   DOB: Jan 11, 1951, 69 y.o.   MRN: [id number] S: Pt with significant change in his mental status yesterday afternoon.  More lethargic and nonverbal.  CT scan negative and EEG and MRI ordered.  He remains lethargic but will open his eyes when called but nonverbal. O:BP (!) 181/76 (BP Location: Right Arm)   Pulse (!) 53   Temp 99.1 F (37.3 C) (Axillary)   Resp 18   Ht 6' (1.829 m)   Wt 72.3 kg   SpO2 96%   BMI 21.62 kg/m   Intake/Output Summary (Last 24 hours) at 11/12/2019 1143 Last data filed at 11/12/2019 1025 Gross per 24 hour  Intake 1279.85 ml  Output 400 ml  Net 879.85 ml   Intake/Output: I/O last 3 completed shifts: In: 1799.4 [P.O.:120; I.V.:1479.4; IV Piggyback:200] Out: 1850 [Urine:1750; Emesis/NG output:100]  Intake/Output this shift:  Total I/O In: 970.9 [I.V.:970.9] Out: -  Weight change: -2.097 kg Gen: thin AAM lying in bed in NAD, lethargic and easily goes back to sleep CVS: bradycardic, no rub Resp: cta Abd: benign Ext: no edema  Recent Labs  Lab 11/07/19 0318 11/08/19 0445 11/09/19 0403 11/10/19 0349 11/11/19 0508 11/11/19 1444 11/12/19 0354  NA 137 138 142 138 140 140 141  K 4.1 4.1 3.6 3.4* 4.1 4.0 4.0  CL 106 109 110 111 111 113* 117*  CO2 20* 20* 21* 19* 16* 16* 16*  GLUCOSE 126* 152* 95 118* 93 108* 104*  BUN 54* 59* 59* 60* 60* 58* 57*  CREATININE 3.94* 4.13* 4.34* 4.18* 4.03* 3.88* 3.90*  ALBUMIN  --   --   --  1.9* 2.0* 2.0* 1.9*  CALCIUM 9.0 8.6* 8.5* 8.2* 8.3* 8.0* 8.2*  PHOS  --   --   --  4.5 3.9 4.0 4.0   Liver Function Tests: Recent Labs  Lab 11/11/19 0508 11/11/19 1444 11/12/19 0354  ALBUMIN 2.0* 2.0* 1.9*   No results for input(s): LIPASE, AMYLASE in the last 168 hours. No results for input(s): AMMONIA in the last 168 hours. CBC: Recent Labs  Lab 11/06/19 0935 11/06/19 0935 11/07/19 0318 11/07/19 0318 11/08/19 0445 11/08/19 0445 11/10/19 0349 11/11/19 0508 11/12/19 0354  WBC 13.2*   < >  8.7   < > 8.2   < > 5.1 5.1 4.9  NEUTROABS 11.5*  --   --   --   --   --   --   --   --   HGB 8.5*   < > 8.4*   < > 8.1*   < > 8.2* 8.3* 8.2*  HCT 25.7*   < > 24.8*   < > 24.5*   < > 25.1* 25.2* 25.9*  MCV 91.5   < > 88.6  --  90.4  --  91.6 91.6 91.8  PLT 214   < > 196   < > 193   < > 210 210 218   < > = values in this interval not displayed.   Cardiac Enzymes: No results for input(s): CKTOTAL, CKMB, CKMBINDEX, TROPONINI in the last 168 hours. CBG: Recent Labs  Lab 11/10/19 2108 11/11/19 0737 11/11/19 1327 11/11/19 2110 11/12/19 0750  GLUCAP 102* 87 92 99 97    Iron Studies:  Recent Labs    11/10/19 0349  IRON 34*  TIBC 136*  FERRITIN 282   Studies/Results: CT HEAD WO CONTRAST  Result Date: 11/11/2019 CLINICAL DATA:  Neuro deficit. Stroke suspected. EXAM: CT HEAD WITHOUT CONTRAST TECHNIQUE:  Contiguous axial images were obtained from the base of the skull through the vertex without intravenous contrast. COMPARISON:  07/31/2019 FINDINGS: Brain: No evidence of acute infarction, hemorrhage, hydrocephalus, extra-axial collection or mass lesion/mass effect. Atrophy and advanced chronic microvascular ischemic changes are again noted. Vascular: No hyperdense vessel or unexpected calcification. Skull: Normal. Negative for fracture or focal lesion. Sinuses/Orbits: No acute finding. Other: None. IMPRESSION: 1. No acute intracranial abnormality. 2. Atrophy and advanced chronic microvascular ischemic changes. Electronically Signed   By: Constance Holster M.D.   On: 11/11/2019 15:15   . aspirin EC  81 mg Oral Daily  . atorvastatin  80 mg Oral QHS  . carvedilol  6.25 mg Oral BID WC  . darbepoetin (ARANESP) injection - NON-DIALYSIS  40 mcg Subcutaneous Q Tue-1800  . dorzolamide-timolol  1 drop Right Eye BID  . ferrous gluconate  324 mg Oral Q breakfast  . finasteride  5 mg Oral Daily  . heparin  5,000 Units Subcutaneous Q8H  . hydrALAZINE  100 mg Oral TID  . isosorbide dinitrate  40 mg  Oral TID  . sodium chloride flush  3 mL Intravenous Q12H  . tamsulosin  0.4 mg Oral QPC supper  . vitamin B-12  1,000 mcg Oral Daily    BMET    Component Value Date/Time   NA 141 11/12/2019 0354   NA 140 08/13/2019 1139   K 4.0 11/12/2019 0354   CL 117 (H) 11/12/2019 0354   CO2 16 (L) 11/12/2019 0354   GLUCOSE 104 (H) 11/12/2019 0354   BUN 57 (H) 11/12/2019 0354   BUN 36 (H) 08/13/2019 1139   CREATININE 3.90 (H) 11/12/2019 0354   CALCIUM 8.2 (L) 11/12/2019 0354   GFRNONAA 15 (L) 11/12/2019 0354   GFRAA 17 (L) 11/12/2019 0354   CBC    Component Value Date/Time   WBC 4.9 11/12/2019 0354   RBC 2.82 (L) 11/12/2019 0354   HGB 8.2 (L) 11/12/2019 0354   HGB 7.7 (L) 08/13/2019 1139   HCT 25.9 (L) 11/12/2019 0354   HCT 22.3 (L) 08/13/2019 1139   PLT 218 11/12/2019 0354   PLT 392 08/13/2019 1139   MCV 91.8 11/12/2019 0354   MCV 86 08/13/2019 1139   MCH 29.1 11/12/2019 0354   MCHC 31.7 11/12/2019 0354   RDW 13.0 11/12/2019 0354   RDW 14.4 08/13/2019 1139   LYMPHSABS 1.1 11/06/2019 0935   LYMPHSABS 2.0 02/24/2018 1142   MONOABS 0.6 11/06/2019 0935   EOSABS 0.0 11/06/2019 0935   EOSABS 0.1 02/24/2018 1142   BASOSABS 0.0 11/06/2019 0935   BASOSABS 0.0 02/24/2018 1142     Assessment/Plan:  1. AMS- new onset yesterday afternoon.  Workup underway.  Unclear if this is a new cva or if this was similar to the syncopal event which prompted his admission. 2. AKI/CKD stage 4- following a syncopal episode.  Baseline Scr appears to be 3.3-3.7 and is slowly improving with IVF's.  Will decrease rate to 66ml/hr. 3. Syncope/NSTEMI- possible arrythmia. Cardiology consulted and wants to proceed with cardiac cath, however Scr climbing since admission. 1. BUN/Cr finally reached a peak of 4.34 and now down to 3.9 2. Plan to proceed with LHC tomorrow if Scr continues to move toward his baseline. 3. DecreaseIV NS to 50 ml/hr and follow UOP and Scr 4. Need to minimize contrast exposure and  hold diuretics prior to [facility] and cont with gentle IVF's pre and post cath. 4. CKD stage 4- presumably due to DM and followed by Dr.  Upton in our office and baseline Scr 3.3-3.7. 5. HTN- increased hydralazine but BP remains elevated. Cont to follow 6. Fever- Tmax 100.5 last night. Afebrile today. Urine culture + for pseudomonas aeruginosa sensitive to cefepime and ceftaz, cipro. Started on cefepime and follow.  7. Anemia of CKD- started aranesp 11/10/19, iron stores stable.  Donetta Potts, MD [employer] [phone]

## 2019-11-12 NOTE — Progress Notes (Signed)
Patient taken to MRI at 1130hrs.  BP remains elevated, order for hydralazine 5mg  IV  q2h for SBP greater than 116mmHg per PA Furth. Second dose given.  Patient returned from MRI at 1300hrs, BP remains elevated. Will notify cardiology.

## 2019-11-12 NOTE — Progress Notes (Signed)
Patient BP remains elevated despite q2h hydralazine, prn lopressor not given due to bradycardia.  Read Drivers PA notified.  Prn hydralazine increased to 10mg  IV.  Will continue to monitor.  PO meds continue to be held due to patient not following commands and concern for aspiration.

## 2019-11-12 NOTE — Progress Notes (Addendum)
Progress Note  Patient Name: Dennis Macias Date of Encounter: 11/12/2019  Primary Cardiologist: Larae Grooms, MD   Subjective   Yesterday afternoon patient was noted to be confused by hospitalist and stat CT head was negative for stroke.   Overnight patient was noted to be confused, imaging was deferred given reassuring exam.   On my exam patient was aphasic and not following instructions. CODE Stroke was called. After physical exam by Neurologist CODE stroke was canceled/stat CT head canceled. Recomend further work-up for AMS.  Inpatient Medications    Scheduled Meds: . aspirin EC  81 mg Oral Daily  . atorvastatin  80 mg Oral QHS  . carvedilol  6.25 mg Oral BID WC  . cloNIDine  0.1 mg Oral BID  . darbepoetin (ARANESP) injection - NON-DIALYSIS  40 mcg Subcutaneous Q Tue-1800  . dorzolamide-timolol  1 drop Right Eye BID  . ferrous gluconate  324 mg Oral Q breakfast  . finasteride  5 mg Oral Daily  . heparin  5,000 Units Subcutaneous Q8H  . hydrALAZINE  100 mg Oral TID  . isosorbide dinitrate  40 mg Oral TID  . sodium bicarbonate  1,300 mg Oral BID  . sodium chloride flush  3 mL Intravenous Q12H  . tamsulosin  0.4 mg Oral QPC supper  . vitamin B-12  1,000 mcg Oral Daily   Continuous Infusions: . sodium chloride    . sodium chloride    . ceFEPime (MAXIPIME) IV Stopped (11/11/19 1445)  . lactated ringers 50 mL/hr at 11/11/19 1445   PRN Meds: sodium chloride, acetaminophen **OR** acetaminophen, sodium chloride flush   Vital Signs    Vitals:   11/12/19 0137 11/12/19 0336 11/12/19 0436 11/12/19 0536  BP: (!) 160/70 (!) 171/74 (!) 179/77 (!) 185/84  Pulse: (!) 59 (!) 54 (!) 54 (!) 59  Resp: 14 (!) 21 19 (!) 21  Temp: 98.5 F (36.9 C) 99.6 F (37.6 C) 99.1 F (37.3 C)   TempSrc: Oral Oral Axillary   SpO2: 98% 93% 98% 91%  Weight:   72.3 kg   Height:        Intake/Output Summary (Last 24 hours) at 11/12/2019 0736 Last data filed at 11/11/2019 2010 Gross per 24 hour   Intake 724.34 ml  Output 650 ml  Net 74.34 ml   Last 3 Weights 11/12/2019 11/11/2019 11/10/2019  Weight (lbs) 159 lb 6.4 oz 164 lb 0.4 oz 159 lb 13.3 oz  Weight (kg) 72.303 kg 74.4 kg 72.5 kg      Telemetry    NSR, HR 60-70, PAC, PVCs - Personally Reviewed  ECG    No new - Personally Reviewed  Physical Exam   GEN: No acute distress.   Neck: No JVD Cardiac: RRR, no murmurs, rubs, or gallops.  Respiratory: Clear to auscultation bilaterally. GI: Soft, nontender, non-distended  MS: No edema; No deformity. Neuro: Aphasic and not following directions  Psych: Normal affect   Labs    High Sensitivity Troponin:   Recent Labs  Lab 10/14/19 2259 10/15/19 0052 11/06/19 0935 11/06/19 1924 11/06/19 2235  TROPONINIHS 20* 19* 1,618* 2,384* 2,233*      Chemistry Recent Labs  Lab 11/11/19 0508 11/11/19 1444 11/12/19 0354  NA 140 140 141  K 4.1 4.0 4.0  CL 111 113* 117*  CO2 16* 16* 16*  GLUCOSE 93 108* 104*  BUN 60* 58* 57*  CREATININE 4.03* 3.88* 3.90*  CALCIUM 8.3* 8.0* 8.2*  ALBUMIN 2.0* 2.0* 1.9*  GFRNONAA 14* 15* 15*  GFRAA 17* 17* 17*  ANIONGAP 13 11 8      Hematology Recent Labs  Lab 11/10/19 0349 11/11/19 0508 11/12/19 0354  WBC 5.1 5.1 4.9  RBC 2.74* 2.75* 2.82*  HGB 8.2* 8.3* 8.2*  HCT 25.1* 25.2* 25.9*  MCV 91.6 91.6 91.8  MCH 29.9 30.2 29.1  MCHC 32.7 32.9 31.7  RDW 13.2 13.1 13.0  PLT 210 210 218    BNP Recent Labs  Lab 11/06/19 0935  BNP 4,075.9*     DDimer No results for input(s): DDIMER in the last 168 hours.   Radiology    CT HEAD WO CONTRAST  Result Date: 11/11/2019 CLINICAL DATA:  Neuro deficit. Stroke suspected. EXAM: CT HEAD WITHOUT CONTRAST TECHNIQUE: Contiguous axial images were obtained from the base of the skull through the vertex without intravenous contrast. COMPARISON:  07/31/2019 FINDINGS: Brain: No evidence of acute infarction, hemorrhage, hydrocephalus, extra-axial collection or mass lesion/mass effect. Atrophy and  advanced chronic microvascular ischemic changes are again noted. Vascular: No hyperdense vessel or unexpected calcification. Skull: Normal. Negative for fracture or focal lesion. Sinuses/Orbits: No acute finding. Other: None. IMPRESSION: 1. No acute intracranial abnormality. 2. Atrophy and advanced chronic microvascular ischemic changes. Electronically Signed   By: Constance Holster M.D.   On: 11/11/2019 15:15    Cardiac Studies   Echo 10/15/19 IMPRESSIONS    1. Left ventricular ejection fraction, by estimation, is 40 to 45%. The  left ventricle has mildly decreased function. The left ventricle  demonstrates regional wall motion abnormalities (see scoring  diagram/findings for description). There is mild  asymmetric left ventricular hypertrophy of the septal segment. Left  ventricular diastolic parameters are consistent with Grade I diastolic  dysfunction (impaired relaxation).  2. Right ventricular systolic function is normal. The right ventricular  size is normal. There is mildly elevated pulmonary artery systolic  pressure.  3. Left atrial size was severely dilated.  4. Moderate pleural effusion.  5. The mitral valve is normal in structure. Mild mitral valve  regurgitation. No evidence of mitral stenosis.  6. The aortic valve is tricuspid. Aortic valve regurgitation is not  visualized. Mild to moderate aortic valve sclerosis/calcification is  present, without any evidence of aortic stenosis.  7. The inferior vena cava is normal in size with greater than 50%  respiratory variability, suggesting right atrial pressure of 3 mmHg.   Comparison(s): Changes from prior study are noted. The left ventricular  function is worsened.   FINDINGS  Left Ventricle: Left ventricular ejection fraction, by estimation, is 40  to 45%. The left ventricle has mildly decreased function. The left  ventricle demonstrates regional wall motion abnormalities. The left  ventricular internal cavity  size was normal  in size. There is mild asymmetric left ventricular hypertrophy of the  septal segment. Left ventricular diastolic parameters are consistent with  Grade I diastolic dysfunction (impaired relaxation).     LV Wall Scoring:  The entire apex is akinetic.   Right Ventricle: The right ventricular size is normal. No increase in  right ventricular wall thickness. Right ventricular systolic function is  normal. There is mildly elevated pulmonary artery systolic pressure. The  tricuspid regurgitant velocity is 2.95  m/s, and with an assumed right atrial pressure of 3 mmHg, the estimated  right ventricular systolic pressure is 30.8 mmHg.   Left Atrium: Left atrial size was severely dilated.   Right Atrium: Right atrial size was normal in size.   Pericardium: Trivial pericardial effusion is present.   Mitral Valve: The mitral  valve is normal in structure. Normal mobility of  the mitral valve leaflets. Mild mitral valve regurgitation. No evidence of  mitral valve stenosis.   Tricuspid Valve: The tricuspid valve is normal in structure. Tricuspid  valve regurgitation is not demonstrated. No evidence of tricuspid  stenosis.   Aortic Valve: The aortic valve is tricuspid. Aortic valve regurgitation is  not visualized. Mild to moderate aortic valve sclerosis/calcification is  present, without any evidence of aortic stenosis.   Patient Profile     69 y.o. male with past medical history of coronary artery disease, ischemic cardiomyopathy, hypertension, hyperlipidemia, chronic stage IV kidney disease, chronic indwelling Foley catheter, history of CVA for evaluation of syncope. Echocardiogram April 2021 showed ejection fraction 40 to 15%, grade 1 diastolic dysfunction, severe left atrial enlargement, mild mitral regurgitation. Nuclear study April 2021 showed ejection fraction 40%. There was apical infarction with peri-infarct ischemia. Patient has been treated medically due to  renal insufficiency and lack of symptoms.  Assessment & Plan    Confusion - yesterday stat CT negative for stroke - Patient with obvious AMS this am and CODE Stroke called but then canceled  - Recommend further work-up for AMS/encephalopathy - patient is on abx for bacteremia/UTI.  - Lactic acid normal - creatinine 3.9 - further work-up per IM  Syncope - unclear etiology.  - Patient is very deconditioned and has had had multiple admission in the last couple months (Feb for nephrotic syndrome, March with anasarca and Rt thoracentesis - Tele with NSVT and PVCs - Hs trop also up in the setting of renal insufficiency but could also have CAD - BC positive for pseudomonas  NSTEMI - HS trop up to 2,000 - Recent stress shows ischemia and reduced LV function of40% - Plan was for cardiac cath today, but will have to cancel given AMS. Patient cannot follow directions  CKD stage IV - nephrology following. Patient may require dialysis after cath - lasix held for rising creatinine and patient was given IVF - creatinine continues to improve 4.34 >4.18>4.03>3.90 - Baseline around 3.5  Ischemic CM/Acute on chronic Diastolic HF - EF 61% this admission - BNP up to 4075 on admissionand patientwas started on IV lasix - Lasix held for possible cath and patient was given fluids - continue BB. Cannot titrate for bradycardia. Continue hydralazine, nitrates  HTN - Pressure still elevated - Hold oral meds given AMS. Give IV hydralazine Q4HPRN  Fever/Bacteremia - BC came back positive with pseudomonas  - IV abx per IM      For questions or updates, please contact [place] Please consult [email or web address] for contact info under        Signed, Chiante Peden Ninfa Meeker, PA-C  11/12/2019, 7:36 AM

## 2019-11-12 NOTE — Progress Notes (Signed)
EEG complete - results pending 

## 2019-11-12 NOTE — Progress Notes (Signed)
Called by RN regarding hypotension not responsive to current medications.  Will add IV nitroglycerin, titrate per Neuro blood pressure parameters.  Rosaria Ferries, PA-C 11/12/2019 7:03 PM

## 2019-11-12 NOTE — Progress Notes (Signed)
Patient is in MRI at this time - will hook up LTM EEG later as schedule permits.

## 2019-11-12 NOTE — Progress Notes (Addendum)
BP remains elevated after increased hydralazine dose, 196/88, HR 58. R. Barrett PA notified.  Orders for nitroglycerine Gtt IV placed.

## 2019-11-12 NOTE — Progress Notes (Signed)
Cardiology PA notified of patient's confusion and unable to sign cath consent.  Code Stroke called per Cardiology team due to increased decline in mental status. Patient not following commands, not responding to any questions.  Neurology and rapid response here to see patient, Code Stroke cancelled.  CT cancelled per Code Stroke team.  Dr. Darrick Meigs notified of changes to patient's neuro assessment and will hold PO medications due to risk of aspiration.  Dr. Darrick Meigs will come to examine patient.

## 2019-11-12 NOTE — Progress Notes (Signed)
PT Cancellation Note  Patient Details Name: Dennis Macias MRN: [id number] DOB: 05/02/1951   Cancelled Treatment:    Reason Eval/Treat Not Completed: (P) Medical issues which prohibited therapy Code Stroke called this morning. Awaiting further evaluation. PT will follow back for treatment as able this afternoon.  [name] B. [name] PT, DPT Acute Rehabilitation Services Pager [phone] Office [phone]    [place] 11/12/2019, 9:34 AM

## 2019-11-12 NOTE — Procedures (Signed)
Patient Name: Moises Terpstra  MRN: [id number]  Epilepsy Attending: Lora Havens  Referring Physician/Provider: Dr. Kerney Elbe Date: 11/12/2019 Duration: 24.37 mins  Patient history: 70 year old male with progressive decline in cognitive function over past 24 hours.  EEG evaluate for seizures.  Level of alertness: awake  AEDs during EEG study: None  Technical aspects: This EEG study was done with scalp electrodes positioned according to the 10-20 International system of electrode placement. Electrical activity was acquired at a sampling rate of 500Hz  and reviewed with a high frequency filter of 70Hz  and a low frequency filter of 1Hz . EEG data were recorded continuously and digitally stored.   Description: No clear posterior dominant was seen. EEG showed continuous generalized 3 to 5 cm in delta slowing.  Triphasic waves, generalized, maximal bifrontal were also seen.  Hyperventilation and photic stimulation were not performed.  Abnormality - Continuous slow, generalized  - Triphasic waves, generalized, maximal bifrontal  IMPRESSION: This study is suggestive of moderate diffuse encephalopathy, nonspecific etiology but likely secondary to toxic-metabolic causes. No seizures or definite epileptiform discharges were seen throughout the recording.    Adelia Baptista [name]

## 2019-11-12 NOTE — Progress Notes (Signed)
OT Cancellation Note  Patient Details Name: Dennis Macias MRN: [id number] DOB: Nov 27, 1950   Cancelled Treatment:    Reason Eval/Treat Not Completed: Medical issues which prohibited therapy, code stroke called this morning.  Awaiting further evaluation.  OT will follow up as able.   Jolaine Artist, OT Acute Rehabilitation Services Pager [phone] Office [phone]    Delight Stare 11/12/2019, 10:34 AM

## 2019-11-13 ENCOUNTER — Inpatient Hospital Stay (HOSPITAL_COMMUNITY): Payer: Medicare PPO

## 2019-11-13 ENCOUNTER — Encounter: Payer: Self-pay | Admitting: Internal Medicine

## 2019-11-13 DIAGNOSIS — R269 Unspecified abnormalities of gait and mobility: Secondary | ICD-10-CM

## 2019-11-13 DIAGNOSIS — I63232 Cerebral infarction due to unspecified occlusion or stenosis of left carotid arteries: Secondary | ICD-10-CM

## 2019-11-13 DIAGNOSIS — I160 Hypertensive urgency: Secondary | ICD-10-CM

## 2019-11-13 DIAGNOSIS — R7881 Bacteremia: Secondary | ICD-10-CM

## 2019-11-13 DIAGNOSIS — Z8673 Personal history of transient ischemic attack (TIA), and cerebral infarction without residual deficits: Secondary | ICD-10-CM

## 2019-11-13 LAB — URINALYSIS, ROUTINE W REFLEX MICROSCOPIC
Bilirubin Urine: NEGATIVE
Glucose, UA: NEGATIVE mg/dL
Ketones, ur: 5 mg/dL — AB
Nitrite: NEGATIVE
Protein, ur: >=300 mg/dL — AB
RBC / HPF: >50 RBC/hpf — ABNORMAL HIGH (ref 0–5)
Specific Gravity, Urine: 1.015 (ref 1.005–1.030)
pH: 6 (ref 5.0–8.0)

## 2019-11-13 LAB — RENAL FUNCTION PANEL
Albumin: 2 g/dL — ABNORMAL LOW (ref 3.5–5.0)
Anion gap: 15 (ref 5–15)
BUN: 53 mg/dL — ABNORMAL HIGH (ref 8–23)
CO2: 18 mmol/L — ABNORMAL LOW (ref 22–32)
Calcium: 8.5 mg/dL — ABNORMAL LOW (ref 8.9–10.3)
Chloride: 111 mmol/L (ref 98–111)
Creatinine, Ser: 3.65 mg/dL — ABNORMAL HIGH (ref 0.61–1.24)
GFR calc Af Amer: 19 mL/min — ABNORMAL LOW (ref >60)
GFR calc non Af Amer: 16 mL/min — ABNORMAL LOW (ref >60)
Glucose, Bld: 95 mg/dL (ref 70–99)
Phosphorus: 3.7 mg/dL (ref 2.5–4.6)
Potassium: 3.6 mmol/L (ref 3.5–5.1)
Sodium: 144 mmol/L (ref 135–145)

## 2019-11-13 LAB — SEDIMENTATION RATE: Sed Rate: 24 mm/hr — ABNORMAL HIGH (ref 0–16)

## 2019-11-13 LAB — AMMONIA: Ammonia: 18 umol/L (ref 9–35)

## 2019-11-13 LAB — C-REACTIVE PROTEIN: CRP: 0.8 mg/dL (ref <1.0)

## 2019-11-13 LAB — TSH: TSH: 4.534 u[IU]/mL — ABNORMAL HIGH (ref 0.350–4.500)

## 2019-11-13 LAB — RPR: RPR Ser Ql: NONREACTIVE

## 2019-11-13 MED ORDER — STROKE: EARLY STAGES OF RECOVERY BOOK
Freq: Once | Status: AC
  Administered 2019-11-13: 1
  Filled 2019-11-13: qty 1

## 2019-11-13 MED ORDER — HYDRALAZINE HCL 20 MG/ML IJ SOLN
10.0000 mg | Freq: Three times a day (TID) | INTRAMUSCULAR | Status: DC
  Administered 2019-11-13: 10 mg via INTRAVENOUS
  Filled 2019-11-13: qty 1

## 2019-11-13 MED ORDER — LORAZEPAM 2 MG/ML IJ SOLN: 2.0000 mg | Freq: Once | INTRAMUSCULAR | Status: DC | PRN

## 2019-11-13 MED ORDER — HYDRALAZINE HCL 20 MG/ML IJ SOLN
10.0000 mg | Freq: Three times a day (TID) | INTRAMUSCULAR | Status: DC | PRN
  Administered 2019-11-17: 10 mg via INTRAVENOUS
  Filled 2019-11-13: qty 1

## 2019-11-13 MED ORDER — LIDOCAINE HCL 1 % IJ SOLN
5.0000 mL | Freq: Once | INTRAMUSCULAR | Status: DC
  Filled 2019-11-13: qty 5

## 2019-11-13 NOTE — Progress Notes (Signed)
Subjective: Dennis Macias was seen at beside this AM with his son. He was lying comfortably in bed but appears more confused compared to the day prior. He has difficulty following commands, responds to questions with "mhm" and "oh" and is not oriented to person, place, or year. There are no other complaints or concerns at this time.    Objective:  Vital signs in last 24 hours: Vitals:   11/13/19 0318 11/13/19 0418 11/13/19 0500 11/13/19 0841  BP: (!) 189/162 (!) 198/85  (!) 192/83  Pulse: 60 (!) 56 (!) 57 (!) 57  Resp: 18 19 18 16   Temp:  (!) 97.4 F (36.3 C)  98.4 F (36.9 C)  TempSrc:  Axillary  Axillary  SpO2: 99% 98%  95%  Weight:  72.8 kg    Height:       General: Elderly male, NAD, laying in bed Cardiac: RRR, no m/r/g Pulmonary: CTABL, no wheezing, rhonchi, rales Abdomen: Soft, non-tender, non-distended Neurologic: Confused, not alert to person, place, or year. Exam was limited to patient having difficulty following commands; unable to assess CN function or strength.  Extremities: No LE edema   Assessment/Plan:  Dennis Macias is a 69 y.o. male with a history of CAD chronic systolic and diastolic heart failure, CKD stage IV, anemia of chronic disease, and BPH with chronic indwelling Foley catheter status post TURP who presented with a syncopal episode that occurred on 11/06/19, now with acute onset confusion since 05/05, with recent MRI consistent with ischemic infarct of the parietal region.  Active Problems:   Syncope and collapse   Syncope   Chronic combined systolic and diastolic heart failure (HCC)   Non-ST elevation (NSTEMI) myocardial infarction [facility])   Encephalopathies   Ischemic Stroke Altered Mental Status The patient has a history of CVA, some weakness on exam, and concern for facial droop on recent exam. Therefore, CT noncontrast of the head was performed on 05/05; this was unremarkable for acute intracranial abnormality. On 05/06, the patient was evaluated by  Cardiology, and a code stroke was called. This was called off as the patient's physical exam was reassuring. Yesterday, we ordered an MRI which showed an ischemic infarct in the left parietal region. the IM night team spoke with Dr. Malen Gauze from Neurology; they are currently on board. There also appears to be a toxic-metabolic component contributing to his new onset altered mental status; recent EEG showed moderate diffuse encephalopathy consistent with this. This could be as a result of his recent infection versus delirium versus medication adverse effects. Given that an adverse effect of cefepime is confusion, he was transitioned to ceftazidime yesterday; he is now on day 6/7 of treatment for UTI and 1/4 blood cultures positive for Pseudomonas. We also discontinued LR and have been giving the patient bicarb through his peripheral line (bicarb 18 from 16 yesterday). We counseled his son that it may take some time to see improvement in his mental status given delayed clearance of medications (i.e.cefepime) as a result of his CKD.  -Neurology on board, appreciate their recs -We are currently holding PO in setting of AMS; will need a speech eval    Syncope: Patient had an episode of syncope that occurred during breakfast on 04/30; no presyncopal times or seizure activity noted. Reported increase in his home BP medications on 04/29 and he had just taken his medications prior to the episode. Labs were significant for troponin elevation of 1600 > 2300 > 2200. Differential includes arrhythmia; cardiology is on board and  planning catheterization although his renal function makes this challenging given risk of contrast nephropathy.  -Cardiology following, appreciate recommendations -Cardiology recommends a left heart cath eventually; will hold off for now given acutely altered mental status. -Continue telemetry  UA showing pyuria; urine cultures positive for Pseudomonas 1/4 blood cultures positive for  Pseudomonas  Last weekend, the patient developed fevers, chills, and some diaphoresis. He is otherwise asymptomatic and is currently afebrile. The exact etiology is unclear at this time, though his indwelling catheter could be a potential source of infection. He is on day 6 of his antibiotic regimen for urine cultures positive for cefepime and ceftazidime-sensitive Pseudomonas species. Additionally, 1/4 blood cultures grew Pseudomonas species which were sensitive to cefepime and ceftazidime. We will continue his ceftazidime regimen at this time (day 6/7). -Ceftazidime, on day 6/7 of antibiotic regimen. -Tylenol for fevers  -Repeat urine culture pending  CKD stage IV: Creatinine on admission was 3.84, near his baseline. Creatinine has been 3.84>>3.94>>4.12>>4.34>>4.18>>4.03>>3.90>>3.94>>3.65 today. Nephrology is currently on board. His recently rising creatinine could be the result of pre-renal (prior Lasix regimen vs. CKD progression 2/2 DM) and post-renal AKI 2/2 BPH. On 05/02, bladder scan showed 400 mL but an accurate PVR was unable to be obtained; subsequent bladder scan showed 313 mL. Repeat bladder scan was 267. -Nephrology consulted; appreciate their recs -Sodium bicarbonate  Hypertension: Patient recently had his hydralazine and isosorbide medications increased at home before his syncopal episode. However, the patient remains hypertensive at this time. Overnight, cardiology added nitro to his regimen given concern for elevated BP to 190s-200s. We will continue antihypertensive regimen with nitro for a goal SBP of 160s-180s. -Holding PO meds given AMS; will need speech eval -Continue nitro regimen  Chronic systolic and diastolic heart failure: BNP on admission elevated to>4000. Was on Lasix initially, but this was recently discontinued given climbing Scr levels. He is euvolemic on examination today and had adequate urine output of ~1500 mL in the past 24 hours. Last recorded dry weight was  80.9 kg in Feb 2021, although his weight was recorded at 70.3>> 80 kg>>80 kg>> 72.5>>74.4kg >>72.3 kg > 72.8 kg today. Will continue to monitor his urine output and weight changes during his admission. -Holding home Lasix -Continue beta blocker regimen -Strict I's/O's -Daily weights  Anemia of chronic disease: -Continue ferrous gluconate -Aranesp per nephrology  Prior to Admission Living Arrangement: Home Anticipated Discharge Location: TBD Barriers to Discharge: Pending further evalaution  Orvis Brill, Medical Student 11/13/2019, 9:18 AM Pager: [phone]

## 2019-11-13 NOTE — Progress Notes (Addendum)
PT Cancellation Note  Patient Details Name: Dennis Macias MRN: [id number] DOB: [birth year]   Cancelled Treatment:    Reason Eval/Treat Not Completed: Patient at procedure or test/unavailable. Pt in MRI. PT to re-attempt re-eval as time allows. MRI 5/6 showed 10 mm acute infarct within the left parietal lobe subcortical white matter. It was significantly motion degraded, therefore, repeat MRI today.   Lorriane Shire 11/13/2019, 11:00 AM  Lorrin Goodell, PT  Office # [phone] Pager [phone]

## 2019-11-13 NOTE — Progress Notes (Signed)
SLP Cancellation Note  Patient Details Name: Dennis Macias MRN: [id number] DOB: [birth year]   Cancelled treatment:       Reason Eval/Treat Not Completed: Patient at procedure or test/unavailable(Pt off unit for MRI. SLP will follow up. )  Zaynah Chawla I. Hardin Negus, [place], [place] Office number [phone] Pager [phone]  Horton Marshall 11/13/2019, 11:05 AM

## 2019-11-13 NOTE — Progress Notes (Signed)
Patient unable to follow commands. PO meds held until speech consult. MD notified

## 2019-11-13 NOTE — Progress Notes (Signed)
Carotid duplex  has been completed. Refer to [facility] under chart review to view preliminary results.   11/13/2019  2:59 PM Dennis Macias, Dennis Macias

## 2019-11-13 NOTE — Progress Notes (Signed)
   11/12/19 2012  Assess: MEWS Score  BP (!) 205/81  Pulse Rate (!) 55  ECG Heart Rate (!) 56  Resp 19  SpO2 97 %  Assess: MEWS Score  MEWS Temp 0  MEWS Systolic 2  MEWS Pulse 0  MEWS RR 0  MEWS LOC 0  MEWS Score 2  MEWS Score Color Yellow  Assess: if the MEWS score is Yellow or Red  Were vital signs taken at a resting state? Yes  Focused Assessment Documented focused assessment  Early Detection of Sepsis Score *See Row Information* Low  MEWS guidelines implemented *See Row Information* Yes  Treat  MEWS Interventions Administered scheduled meds/treatments  Take Vital Signs  Increase Vital Sign Frequency  Yellow: Q 2hr X 2 then Q 4hr X 2, if remains yellow, continue Q 4hrs  Escalate  MEWS: Escalate Yellow: discuss with charge nurse/RN and consider discussing with provider and RRT  Notify: Charge Nurse/RN  Name of Charge Nurse/RN Notified Shanell RN   Date Charge Nurse/RN Notified 11/12/19  Time Charge Nurse/RN Notified 2037  Notify: Provider  Provider Name/Title Dr.Masoudi  Date Provider Notified 11/12/19  Time Provider Notified 2108  Notification Type Page  Notification Reason Other (Comment) (B/P meds)  Response See new orders  Date of Provider Response 11/12/19  Time of Provider Response 2130

## 2019-11-13 NOTE — Progress Notes (Signed)
Patient ID: Bowden Boody, male   DOB: [birth year], 69 y.o.   MRN: [id number] S: Results of MRI noted.   O:BP (!) 178/81 (BP Location: Left Arm)   Pulse (!) 53   Temp 97.6 F (36.4 C) (Axillary)   Resp 14   Ht 6' (1.829 m)   Wt 72.8 kg   SpO2 100%   BMI 21.77 kg/m   Intake/Output Summary (Last 24 hours) at 11/13/2019 1210 Last data filed at 11/13/2019 1100 Gross per 24 hour  Intake 1997.87 ml  Output 1975 ml  Net 22.87 ml   Intake/Output: I/O last 3 completed shifts: In: 2620.8 [I.V.:2520.8; IV Piggyback:100] Out: 2947 [Urine:1775]  Intake/Output this shift:  Total I/O In: 347.9 [I.V.:347.9] Out: 500 [Urine:500] Weight change: 0.497 kg Gen: NAD, somnolent butarousable CVS: Bradycardic, no rub Resp: CTA Abd: +BS, soft, Nt/ND Ext: no edema  Recent Labs  Lab 11/09/19 0403 11/10/19 0349 11/11/19 0508 11/11/19 1444 11/12/19 0354 11/12/19 1039 11/13/19 0419  NA 142 138 140 140 141 143 144  K 3.6 3.4* 4.1 4.0 4.0 4.0 3.6  CL 110 111 111 113* 117* 115* 111  CO2 21* 19* 16* 16* 16* 16* 18*  GLUCOSE 95 118* 93 108* 104* 108* 95  BUN 59* 60* 60* 58* 57* 56* 53*  CREATININE 4.34* 4.18* 4.03* 3.88* 3.90* 3.94* 3.65*  ALBUMIN  --  1.9* 2.0* 2.0* 1.9* 1.9* 2.0*  CALCIUM 8.5* 8.2* 8.3* 8.0* 8.2* 8.5* 8.5*  PHOS  --  4.5 3.9 4.0 4.0  --  3.7  AST  --   --   --   --   --  12*  --   ALT  --   --   --   --   --  10  --    Liver Function Tests: Recent Labs  Lab 11/12/19 0354 11/12/19 1039 11/13/19 0419  AST  --  12*  --   ALT  --  10  --   ALKPHOS  --  42  --   BILITOT  --  1.1  --   PROT  --  5.1*  --   ALBUMIN 1.9* 1.9* 2.0*   No results for input(s): LIPASE, AMYLASE in the last 168 hours. Recent Labs  Lab 11/13/19 0419  AMMONIA 18   CBC: Recent Labs  Lab 11/07/19 0318 11/07/19 0318 11/08/19 0445 11/08/19 0445 11/10/19 0349 11/11/19 0508 11/12/19 0354  WBC 8.7   < > 8.2   < > 5.1 5.1 4.9  HGB 8.4*   < > 8.1*   < > 8.2* 8.3* 8.2*  HCT 24.8*   < > 24.5*   < >  25.1* 25.2* 25.9*  MCV 88.6  --  90.4  --  91.6 91.6 91.8  PLT 196   < > 193   < > 210 210 218   < > = values in this interval not displayed.   Cardiac Enzymes: No results for input(s): CKTOTAL, CKMB, CKMBINDEX, TROPONINI in the last 168 hours. CBG: Recent Labs  Lab 11/11/19 0737 11/11/19 1327 11/11/19 2110 11/12/19 0750 11/12/19 1655  GLUCAP 87 92 99 97 97    Iron Studies: No results for input(s): IRON, TIBC, TRANSFERRIN, FERRITIN in the last 72 hours. Studies/Results: EEG  Result Date: 11/12/2019 Lora Havens, MD     11/12/2019  1:51 PM Patient Name: Zaelyn Barbary MRN: [id number] Epilepsy Attending: Lora Havens Referring Physician/Provider: Dr. Kerney Elbe Date: 11/12/2019 Duration: 24.37 mins Patient history: 69 year old male with progressive  decline in cognitive function over past 24 hours.  EEG evaluate for seizures. Level of alertness: awake AEDs during EEG study: None Technical aspects: This EEG study was done with scalp electrodes positioned according to the 10-20 International system of electrode placement. Electrical activity was acquired at a sampling rate of 500Hz  and reviewed with a high frequency filter of 70Hz  and a low frequency filter of 1Hz . EEG data were recorded continuously and digitally stored. Description: No clear posterior dominant was seen. EEG showed continuous generalized 3 to 5 cm in delta slowing.  Triphasic waves, generalized, maximal bifrontal were also seen.  Hyperventilation and photic stimulation were not performed. Abnormality - Continuous slow, generalized - Triphasic waves, generalized, maximal bifrontal IMPRESSION: This study is suggestive of moderate diffuse encephalopathy, nonspecific etiology but likely secondary to toxic-metabolic causes. No seizures or definite epileptiform discharges were seen throughout the recording. Lora Havens   CT HEAD WO CONTRAST  Result Date: 11/11/2019 CLINICAL DATA:  Neuro deficit. Stroke suspected. EXAM: CT HEAD  WITHOUT CONTRAST TECHNIQUE: Contiguous axial images were obtained from the base of the skull through the vertex without intravenous contrast. COMPARISON:  07/31/2019 FINDINGS: Brain: No evidence of acute infarction, hemorrhage, hydrocephalus, extra-axial collection or mass lesion/mass effect. Atrophy and advanced chronic microvascular ischemic changes are again noted. Vascular: No hyperdense vessel or unexpected calcification. Skull: Normal. Negative for fracture or focal lesion. Sinuses/Orbits: No acute finding. Other: None. IMPRESSION: 1. No acute intracranial abnormality. 2. Atrophy and advanced chronic microvascular ischemic changes. Electronically Signed   By: Constance Holster M.D.   On: 11/11/2019 15:15   MR BRAIN WO CONTRAST  Result Date: 11/12/2019 CLINICAL DATA:  Encephalopathy. Additional provided: Altered mental status. EXAM: MRI HEAD WITHOUT CONTRAST TECHNIQUE: Multiplanar, multiecho pulse sequences of the brain and surrounding structures were obtained without intravenous contrast. COMPARISON:  Noncontrast head CT 11/11/2019, brain MRI 11/12/2015. FINDINGS: Brain: Multiple sequences are significantly motion degraded, limiting evaluation. Most notably, there is moderate/severe motion degradation of the axial T2/FLAIR sequence, moderate/severe motion degradation of the coronal T2 weighted sequence and moderate motion degradation of the sagittal T1 weighted sequence. There is a 10 mm focus of restricted diffusion consistent with acute infarction with left parietal white matter (series 4, image 16). Advanced patchy and confluent T2/FLAIR hyperintensity within the cerebral white matter is nonspecific, but consistent with chronic small vessel ischemic disease. Findings have progressed as compared to prior MRI 09/12/2015. Chronic small vessel ischemic changes within the basal ganglia have also progressed. There is a tiny chronic left thalamic lacunar infarct which was not present on the prior MRI. There  are few scattered punctate chronic microhemorrhages within bilateral cerebral hemispheres, nonspecific. Stable, mild generalized parenchymal atrophy. Vascular: Expected proximal arterial flow voids. Skull and upper cervical spine: No focal marrow lesion. Sinuses/Orbits: Visualized orbits show no acute finding. Minimal ethmoid and maxillary sinus mucosal thickening. No significant mastoid effusion. IMPRESSION: 1. Significantly motion degraded examination as described. 2. 10 mm acute infarct within the left parietal lobe subcortical white matter. 3. Advanced chronic small vessel ischemic changes most notably within the cerebral white matter, progressed as compared to MRI 09/12/2015. A chronic left thalamic lacunar infarct was not present on this prior exam. 4. Stable, mild generalized parenchymal atrophy. 5. Minimal ethmoid and maxillary sinus mucosal thickening. Electronically Signed   By: Kellie Simmering DO   On: 11/12/2019 12:29   . aspirin EC  81 mg Oral Daily  . atorvastatin  80 mg Oral QHS  . carvedilol  6.25 mg Oral  BID WC  . darbepoetin (ARANESP) injection - NON-DIALYSIS  40 mcg Subcutaneous Q Tue-1800  . dorzolamide-timolol  1 drop Right Eye BID  . ferrous gluconate  324 mg Oral Q breakfast  . finasteride  5 mg Oral Daily  . heparin  5,000 Units Subcutaneous Q8H  . hydrALAZINE  100 mg Oral TID  . isosorbide dinitrate  40 mg Oral TID  . mouth rinse  15 mL Mouth Rinse BID  . sodium chloride flush  3 mL Intravenous Q12H  . tamsulosin  0.4 mg Oral QPC supper  . vitamin B-12  1,000 mcg Oral Daily    BMET    Component Value Date/Time   NA 144 11/13/2019 0419   NA 140 08/13/2019 1139   K 3.6 11/13/2019 0419   CL 111 11/13/2019 0419   CO2 18 (L) 11/13/2019 0419   GLUCOSE 95 11/13/2019 0419   BUN 53 (H) 11/13/2019 0419   BUN 36 (H) 08/13/2019 1139   CREATININE 3.65 (H) 11/13/2019 0419   CALCIUM 8.5 (L) 11/13/2019 0419   GFRNONAA 16 (L) 11/13/2019 0419   GFRAA 19 (L) 11/13/2019 0419    CBC    Component Value Date/Time   WBC 4.9 11/12/2019 0354   RBC 2.82 (L) 11/12/2019 0354   HGB 8.2 (L) 11/12/2019 0354   HGB 7.7 (L) 08/13/2019 1139   HCT 25.9 (L) 11/12/2019 0354   HCT 22.3 (L) 08/13/2019 1139   PLT 218 11/12/2019 0354   PLT 392 08/13/2019 1139   MCV 91.8 11/12/2019 0354   MCV 86 08/13/2019 1139   MCH 29.1 11/12/2019 0354   MCHC 31.7 11/12/2019 0354   RDW 13.0 11/12/2019 0354   RDW 14.4 08/13/2019 1139   LYMPHSABS 1.1 11/06/2019 0935   LYMPHSABS 2.0 02/24/2018 1142   MONOABS 0.6 11/06/2019 0935   EOSABS 0.0 11/06/2019 0935   EOSABS 0.1 02/24/2018 1142   BASOSABS 0.0 11/06/2019 0935   BASOSABS 0.0 02/24/2018 1142    Assessment/Plan:  1. New Left parietal infarct seen on MRI - presented with significant AMS and dysarthria.  Workup underway.  Neuro following. 2. AKI/CKD stage 4- following a syncopal episode.Baseline Scr appears to be 3.3-3.7 and is slowly improving with IVF's. Will decrease rate to 44ml/hr. Scr has improved back to his baseline Scr.  3. Syncope/NSTEMI- possible arrythmia or could have been TIA. Cardiology consulted and wanted to proceed with cardiac cath, however given recent stroke that is on hold. 4. CKD stage 4- presumably due to DM and followed by Dr. Hollie Salk in our office and baseline Scr 3.3-3.7. 5. HTN- increased hydralazine but BP remains elevated. Cont to follow and lower slowly with recent ischemic stroke. 6. Fever- Tmax 100.5 last night. Afebrile today. Urine culture + for pseudomonas aeruginosa sensitive to cefepime and ceftaz, cipro. Started on cefepime and follow.  7. Anemia of CKD-started aranesp 11/10/19, iron stores stable.  Donetta Potts, MD [employer] [phone]

## 2019-11-13 NOTE — Progress Notes (Signed)
Progress Note  Patient Name: Dennis Macias Date of Encounter: 11/13/2019  Primary Cardiologist: Dennis Grooms, MD   Subjective   Brain MRI showed acute stroke and EEG positive for encephalopathy. Cath held in the setting of AMS/stroke. Creatinine is slowly improving with gentle hydration. On exam patient is awake but still aphasic only mumbling on occasion. Pressures elevated overnight and IV Nitro was started.   Inpatient Medications    Scheduled Meds: . aspirin EC  81 mg Oral Daily  . atorvastatin  80 mg Oral QHS  . carvedilol  6.25 mg Oral BID WC  . darbepoetin (ARANESP) injection - NON-DIALYSIS  40 mcg Subcutaneous Q Tue-1800  . dorzolamide-timolol  1 drop Right Eye BID  . ferrous gluconate  324 mg Oral Q breakfast  . finasteride  5 mg Oral Daily  . heparin  5,000 Units Subcutaneous Q8H  . hydrALAZINE  100 mg Oral TID  . isosorbide dinitrate  40 mg Oral TID  . mouth rinse  15 mL Mouth Rinse BID  . sodium chloride flush  3 mL Intravenous Q12H  . tamsulosin  0.4 mg Oral QPC supper  . vitamin B-12  1,000 mcg Oral Daily   Continuous Infusions: . sodium chloride    . sodium chloride    . cefTAZidime (FORTAZ)  IV Stopped (11/12/19 1449)  . nitroGLYCERIN 40 mcg/min (11/12/19 2107)  .  sodium bicarbonate (isotonic) infusion in sterile water 75 mL/hr at 11/13/19 0142   PRN Meds: sodium chloride, acetaminophen **OR** acetaminophen, sodium chloride flush   Vital Signs    Vitals:   11/13/19 0223 11/13/19 0318 11/13/19 0418 11/13/19 0500  BP: (!) 208/90 (!) 189/162 (!) 198/85   Pulse:  60 (!) 56 (!) 57  Resp: 17 18 19 18   Temp:   (!) 97.4 F (36.3 C)   TempSrc:   Axillary   SpO2:  99% 98%   Weight:   72.8 kg   Height:        Intake/Output Summary (Last 24 hours) at 11/13/2019 0736 Last data filed at 11/13/2019 0117 Gross per 24 hour  Intake 1576.51 ml  Output 1475 ml  Net 101.51 ml   Last 3 Weights 11/13/2019 11/12/2019 11/11/2019  Weight (lbs) 160 lb 7.9 oz 159 lb 6.4  oz 164 lb 0.4 oz  Weight (kg) 72.8 kg 72.303 kg 74.4 kg      Telemetry    NSR, 50-60s, rare PVCs - Personally Reviewed  ECG     No new - Personally Reviewed  Physical Exam   GEN: No acute distress.   Neck: No JVD Cardiac: RRR, no murmurs, rubs, or gallops.  Respiratory: Clear to auscultation bilaterally. GI: Soft, nontender, non-distended  MS: No edema; No deformity. Neuro:  Aphasic Psych: Normal affect   Labs    High Sensitivity Troponin:   Recent Labs  Lab 10/14/19 2259 10/15/19 0052 11/06/19 0935 11/06/19 1924 11/06/19 2235  TROPONINIHS 20* 19* 1,618* 2,384* 2,233*      Chemistry Recent Labs  Lab 11/12/19 0354 11/12/19 1039 11/13/19 0419  NA 141 143 144  K 4.0 4.0 3.6  CL 117* 115* 111  CO2 16* 16* 18*  GLUCOSE 104* 108* 95  BUN 57* 56* 53*  CREATININE 3.90* 3.94* 3.65*  CALCIUM 8.2* 8.5* 8.5*  PROT  --  5.1*  --   ALBUMIN 1.9* 1.9* 2.0*  AST  --  12*  --   ALT  --  10  --   ALKPHOS  --  42  --  BILITOT  --  1.1  --   GFRNONAA 15* 15* 16*  GFRAA 17* 17* 19*  ANIONGAP 8 12 15      Hematology Recent Labs  Lab 11/10/19 0349 11/11/19 0508 11/12/19 0354  WBC 5.1 5.1 4.9  RBC 2.74* 2.75* 2.82*  HGB 8.2* 8.3* 8.2*  HCT 25.1* 25.2* 25.9*  MCV 91.6 91.6 91.8  MCH 29.9 30.2 29.1  MCHC 32.7 32.9 31.7  RDW 13.2 13.1 13.0  PLT 210 210 218    BNP Recent Labs  Lab 11/06/19 0935  BNP 4,075.9*     DDimer No results for input(s): DDIMER in the last 168 hours.   Radiology    EEG  Result Date: 11/12/2019 Dennis Havens, MD     11/12/2019  1:51 PM Patient Name: Dennis Macias MRN: [id number] Epilepsy Attending: Lora Macias Referring Physician/Provider: Dr. Kerney Macias Date: 11/12/2019 Duration: 24.37 mins Patient history: 69 year old male with progressive decline in cognitive function over past 24 hours.  EEG evaluate for seizures. Level of alertness: awake AEDs during EEG study: None Technical aspects: This EEG study was done with scalp  electrodes positioned according to the 10-20 International system of electrode placement. Electrical activity was acquired at a sampling rate of 500Hz  and reviewed with a high frequency filter of 70Hz  and a low frequency filter of 1Hz . EEG data were recorded continuously and digitally stored. Description: No clear posterior dominant was seen. EEG showed continuous generalized 3 to 5 cm in delta slowing.  Triphasic waves, generalized, maximal bifrontal were also seen.  Hyperventilation and photic stimulation were not performed. Abnormality - Continuous slow, generalized - Triphasic waves, generalized, maximal bifrontal IMPRESSION: This study is suggestive of moderate diffuse encephalopathy, nonspecific etiology but likely secondary to toxic-metabolic causes. No seizures or definite epileptiform discharges were seen throughout the recording. Dennis Macias   CT HEAD WO CONTRAST  Result Date: 11/11/2019 CLINICAL DATA:  Neuro deficit. Stroke suspected. EXAM: CT HEAD WITHOUT CONTRAST TECHNIQUE: Contiguous axial images were obtained from the base of the skull through the vertex without intravenous contrast. COMPARISON:  07/31/2019 FINDINGS: Brain: No evidence of acute infarction, hemorrhage, hydrocephalus, extra-axial collection or mass lesion/mass effect. Atrophy and advanced chronic microvascular ischemic changes are again noted. Vascular: No hyperdense vessel or unexpected calcification. Skull: Normal. Negative for fracture or focal lesion. Sinuses/Orbits: No acute finding. Other: None. IMPRESSION: 1. No acute intracranial abnormality. 2. Atrophy and advanced chronic microvascular ischemic changes. Electronically Signed   By: Dennis Macias M.D.   On: 11/11/2019 15:15   MR BRAIN WO CONTRAST  Result Date: 11/12/2019 CLINICAL DATA:  Encephalopathy. Additional provided: Altered mental status. EXAM: MRI HEAD WITHOUT CONTRAST TECHNIQUE: Multiplanar, multiecho pulse sequences of the brain and surrounding  structures were obtained without intravenous contrast. COMPARISON:  Noncontrast head CT 11/11/2019, brain MRI 11/12/2015. FINDINGS: Brain: Multiple sequences are significantly motion degraded, limiting evaluation. Most notably, there is moderate/severe motion degradation of the axial T2/FLAIR sequence, moderate/severe motion degradation of the coronal T2 weighted sequence and moderate motion degradation of the sagittal T1 weighted sequence. There is a 10 mm focus of restricted diffusion consistent with acute infarction with left parietal white matter (series 4, image 16). Advanced patchy and confluent T2/FLAIR hyperintensity within the cerebral white matter is nonspecific, but consistent with chronic small vessel ischemic disease. Findings have progressed as compared to prior MRI 09/12/2015. Chronic small vessel ischemic changes within the basal ganglia have also progressed. There is a tiny chronic left thalamic lacunar infarct which was not present  on the prior MRI. There are few scattered punctate chronic microhemorrhages within bilateral cerebral hemispheres, nonspecific. Stable, mild generalized parenchymal atrophy. Vascular: Expected proximal arterial flow voids. Skull and upper cervical spine: No focal marrow lesion. Sinuses/Orbits: Visualized orbits show no acute finding. Minimal ethmoid and maxillary sinus mucosal thickening. No significant mastoid effusion. IMPRESSION: 1. Significantly motion degraded examination as described. 2. 10 mm acute infarct within the left parietal lobe subcortical white matter. 3. Advanced chronic small vessel ischemic changes most notably within the cerebral white matter, progressed as compared to MRI 09/12/2015. A chronic left thalamic lacunar infarct was not present on this prior exam. 4. Stable, mild generalized parenchymal atrophy. 5. Minimal ethmoid and maxillary sinus mucosal thickening. Electronically Signed   By: Kellie Simmering DO   On: 11/12/2019 12:29    Cardiac  Studies   Echo 10/15/19 IMPRESSIONS    1. Left ventricular ejection fraction, by estimation, is 40 to 45%. The  left ventricle has mildly decreased function. The left ventricle  demonstrates regional wall motion abnormalities (see scoring  diagram/findings for description). There is mild  asymmetric left ventricular hypertrophy of the septal segment. Left  ventricular diastolic parameters are consistent with Grade I diastolic  dysfunction (impaired relaxation).  2. Right ventricular systolic function is normal. The right ventricular  size is normal. There is mildly elevated pulmonary artery systolic  pressure.  3. Left atrial size was severely dilated.  4. Moderate pleural effusion.  5. The mitral valve is normal in structure. Mild mitral valve  regurgitation. No evidence of mitral stenosis.  6. The aortic valve is tricuspid. Aortic valve regurgitation is not  visualized. Mild to moderate aortic valve sclerosis/calcification is  present, without any evidence of aortic stenosis.  7. The inferior vena cava is normal in size with greater than 50%  respiratory variability, suggesting right atrial pressure of 3 mmHg.   Comparison(s): Changes from prior study are noted. The left ventricular  function is worsened.   FINDINGS  Left Ventricle: Left ventricular ejection fraction, by estimation, is 40  to 45%. The left ventricle has mildly decreased function. The left  ventricle demonstrates regional wall motion abnormalities. The left  ventricular internal cavity size was normal  in size. There is mild asymmetric left ventricular hypertrophy of the  septal segment. Left ventricular diastolic parameters are consistent with  Grade I diastolic dysfunction (impaired relaxation).     LV Wall Scoring:  The entire apex is akinetic.   Right Ventricle: The right ventricular size is normal. No increase in  right ventricular wall thickness. Right ventricular systolic function is    normal. There is mildly elevated pulmonary artery systolic pressure. The  tricuspid regurgitant velocity is 2.95  m/s, and with an assumed right atrial pressure of 3 mmHg, the estimated  right ventricular systolic pressure is 86.5 mmHg.   Left Atrium: Left atrial size was severely dilated.   Right Atrium: Right atrial size was normal in size.   Pericardium: Trivial pericardial effusion is present.   Mitral Valve: The mitral valve is normal in structure. Normal mobility of  the mitral valve leaflets. Mild mitral valve regurgitation. No evidence of  mitral valve stenosis.   Tricuspid Valve: The tricuspid valve is normal in structure. Tricuspid  valve regurgitation is not demonstrated. No evidence of tricuspid  stenosis.   Aortic Valve: The aortic valve is tricuspid. Aortic valve regurgitation is  not visualized. Mild to moderate aortic valve sclerosis/calcification is  present, without any evidence of aortic stenosis.  Patient Profile     69 y.o. male with past medical history of coronary artery disease, ischemic cardiomyopathy, hypertension, hyperlipidemia, chronic stage IV kidney disease, chronic indwelling Foley catheter, history of CVA for evaluation of syncope. Echocardiogram April 2021 showed ejection fraction 40 to 02%, grade 1 diastolic dysfunction, severe left atrial enlargement, mild mitral regurgitation. Nuclear study April 2021 showed ejection fraction 40%. There was apical infarction with peri-infarct ischemia. Patient has been treated medically due to renal insufficiency and lack of symptoms.  Assessment & Plan    AMS/Acute stroke -  stat CT 5/5 negative for stroke - Patient with obvious AMS 5/6 and CODE Stroke called,  suspicion for toxic and/or metabolic encephalopathy - MRI brain showed acute infarct left parietal lobe subcortical white matter. Pt is out of the window for tPA - EEG showed moderate diffuse encephalopathy and cefepime transitioned to  ceftiazidime - neurology following  Syncope - unclear etiology.  - Patientis very deconditioned and hashad had multiple admission in the last couple months - Tele with NSVT and PVCs - Hs trop also up in the setting of renal insufficiency but could also have CAD - BC positive for pseudomonas  NSTEMI - HS trop up to 2,000 - Recent stress shows ischemia and reduced LV function of40% - Plan was for cardiac cath but this was canceled given AMS - IV Nitro started  CKD stage IV - nephrology following.  - creatininecontinues to improve with IVF 4.03>3.90>3.65 - Baseline around 3.5  Ischemic CM/Acute on chronic Diastolic HF - EF 58% this admission - BNP up to 4075 on admissionand patientwas started on IV lasix - Lasix held for possible cathand patient was given fluids - Coreg 6.24mg  BID held for AMS  HTN - Pressure were elevated and IV hydralazine was started - Overnight pressures were still high and patient was started on IV nitro overnight - pressures are still high this AM  Fever/Bacteremia -BC came back positive with pseudomonas -IVabx per IM   For questions or updates, please contact [facility] Please consult [email or web address] for contact info under        Signed, Demarrius Guerrero Ninfa Meeker, PA-C  11/13/2019, 7:36 AM

## 2019-11-13 NOTE — Progress Notes (Addendum)
STROKE TEAM PROGRESS NOTE   INTERVAL HISTORY Patient girlfriend and daughter at the bedside Along with RN and speech therapies.  Patient lying in bed, moving all extremities symmetrically, tracking bilaterally, however, not following commands, only making "O" sound for everything.  No fever, labs are stable, BP still on the higher end, no nuchal rigidity.  As per family, patient had rather acute mental status change, aphasia and confusion starting on Wednesday, and continued until today.  Cefepime changed to [facility] yesterday.  Creatinine stable at 3.65.  Passed swallow on nectar thick liquid.  Vitals:   11/13/19 1135 11/13/19 1140 11/13/19 1145 11/13/19 1149  BP: (!) 198/100 (!) 190/90 (!) 178/81 (!) 178/81  Pulse: (!) 56 (!) 53 (!) 56 (!) 53  Resp: 15 13 10 14   Temp:    97.6 F (36.4 C)  TempSrc:    Axillary  SpO2: 100% 99% 100% 100%  Weight:      Height:        CBC:  Recent Labs  Lab 11/11/19 0508 11/12/19 0354  WBC 5.1 4.9  HGB 8.3* 8.2*  HCT 25.2* 25.9*  MCV 91.6 91.8  PLT 210 093    Basic Metabolic Panel:  Recent Labs  Lab 11/06/19 1924 11/07/19 0318 11/12/19 0354 11/12/19 0354 11/12/19 1039 11/13/19 0419  NA  --    < > 141   < > 143 144  K  --    < > 4.0   < > 4.0 3.6  CL  --    < > 117*   < > 115* 111  CO2  --    < > 16*   < > 16* 18*  GLUCOSE  --    < > 104*   < > 108* 95  BUN  --    < > 57*   < > 56* 53*  CREATININE  --    < > 3.90*   < > 3.94* 3.65*  CALCIUM  --    < > 8.2*   < > 8.5* 8.5*  MG 1.9  --   --   --   --   --   PHOS  --    < > 4.0  --   --  3.7   < > = values in this interval not displayed.   Lipid Panel:     Component Value Date/Time   CHOL 123 10/14/2019 1028   TRIG 29 10/14/2019 1028   HDL 65 10/14/2019 1028   CHOLHDL 1.9 10/14/2019 1028   CHOLHDL 4.8 09/13/2015 0546   VLDL 37 09/13/2015 0546   LDLCALC 49 10/14/2019 1028   HgbA1c:  Lab Results  Component Value Date   HGBA1C 5.2 10/13/2019   Urine Drug Screen:     Component  Value Date/Time   LABOPIA NONE DETECTED 07/31/2019 1145   COCAINSCRNUR NONE DETECTED 07/31/2019 1145   LABBENZ NONE DETECTED 07/31/2019 1145   AMPHETMU NONE DETECTED 07/31/2019 1145   THCU NONE DETECTED 07/31/2019 1145   LABBARB NONE DETECTED 07/31/2019 1145    Alcohol Level     Component Value Date/Time   ETH <10 07/31/2019 1019    IMAGING past 24 hours EEG  Result Date: 11/12/2019 Lora Havens, MD     11/12/2019  1:51 PM Patient Name: Dennis Macias MRN: [id number] Epilepsy Attending: Lora Havens Referring Physician/Provider: Dr. Kerney Elbe Date: 11/12/2019 Duration: 24.37 mins Patient history: 69 year old male with progressive decline in cognitive function over past 24 hours.  EEG evaluate  for seizures. Level of alertness: awake AEDs during EEG study: None Technical aspects: This EEG study was done with scalp electrodes positioned according to the 10-20 International system of electrode placement. Electrical activity was acquired at a sampling rate of 500Hz  and reviewed with a high frequency filter of 70Hz  and a low frequency filter of 1Hz . EEG data were recorded continuously and digitally stored. Description: No clear posterior dominant was seen. EEG showed continuous generalized 3 to 5 cm in delta slowing.  Triphasic waves, generalized, maximal bifrontal were also seen.  Hyperventilation and photic stimulation were not performed. Abnormality - Continuous slow, generalized - Triphasic waves, generalized, maximal bifrontal IMPRESSION: This study is suggestive of moderate diffuse encephalopathy, nonspecific etiology but likely secondary to toxic-metabolic causes. No seizures or definite epileptiform discharges were seen throughout the recording. Lora Havens   MR BRAIN WO CONTRAST  Result Date: 11/12/2019 CLINICAL DATA:  Encephalopathy. Additional provided: Altered mental status. EXAM: MRI HEAD WITHOUT CONTRAST TECHNIQUE: Multiplanar, multiecho pulse sequences of the brain and surrounding  structures were obtained without intravenous contrast. COMPARISON:  Noncontrast head CT 11/11/2019, brain MRI 11/12/2015. FINDINGS: Brain: Multiple sequences are significantly motion degraded, limiting evaluation. Most notably, there is moderate/severe motion degradation of the axial T2/FLAIR sequence, moderate/severe motion degradation of the coronal T2 weighted sequence and moderate motion degradation of the sagittal T1 weighted sequence. There is a 10 mm focus of restricted diffusion consistent with acute infarction with left parietal white matter (series 4, image 16). Advanced patchy and confluent T2/FLAIR hyperintensity within the cerebral white matter is nonspecific, but consistent with chronic small vessel ischemic disease. Findings have progressed as compared to prior MRI 09/12/2015. Chronic small vessel ischemic changes within the basal ganglia have also progressed. There is a tiny chronic left thalamic lacunar infarct which was not present on the prior MRI. There are few scattered punctate chronic microhemorrhages within bilateral cerebral hemispheres, nonspecific. Stable, mild generalized parenchymal atrophy. Vascular: Expected proximal arterial flow voids. Skull and upper cervical spine: No focal marrow lesion. Sinuses/Orbits: Visualized orbits show no acute finding. Minimal ethmoid and maxillary sinus mucosal thickening. No significant mastoid effusion. IMPRESSION: 1. Significantly motion degraded examination as described. 2. 10 mm acute infarct within the left parietal lobe subcortical white matter. 3. Advanced chronic small vessel ischemic changes most notably within the cerebral white matter, progressed as compared to MRI 09/12/2015. A chronic left thalamic lacunar infarct was not present on this prior exam. 4. Stable, mild generalized parenchymal atrophy. 5. Minimal ethmoid and maxillary sinus mucosal thickening. Electronically Signed   By: Kellie Simmering DO   On: 11/12/2019 12:29    PHYSICAL  EXAM  Temp:  [97.4 F (36.3 C)-98.4 F (36.9 C)] 97.6 F (36.4 C) (05/07 1149) Pulse Rate:  [50-89] 53 (05/07 1149) Resp:  [10-23] 14 (05/07 1149) BP: (134-209)/(76-162) 184/82 (05/07 1404) SpO2:  [93 %-100 %] 100 % (05/07 1149) Weight:  [72.8 kg] 72.8 kg (05/07 0418)  General - Well nourished, well developed, in no apparent distress.  Ophthalmologic - fundi not visualized due to noncooperation.  Cardiovascular - Regular rhythm and rate.  Neuro - awake alert, tracking bilaterally, no gaze palsy, not blinking to visual threat consistently but no obvious hemianopia.  Still not following commands, only make "O" sound for everything.  Right eye pupil large fixed with corneal clouding, left eye pupil reactive to light 3 mm.  EOMI.  Facial symmetrical.  Moving bilateral upper extremities 3/5, mild asterixis.  Bilateral lower extremity at least 2/5 with pain  post ablation.  DTR 1+, no Babinski.  Sensation, coordination and gait not tested.   ASSESSMENT/PLAN [name] is a 69 y.o. male with history of complicated ischemic cardiomyopathy, chronic diastolic heart failure, stage 4 kidney disease, DM, HTN and prior stroke who was admitted 4/30 for syncope, dx w/ NSTEMI and found to have pseudomonas urosepsis 1/4 BCx. In hospital developed altered mental status (asphaic) w/ facial droop in setting of hypercarbia with extremity tremors. Toxic metabolic encephalopathy vs stroke.   Metabolic encephalopathy  Bilateral upper extremity asterixis  Nonfocal on exam but global aphasia  Ammonia, ABG, LFT within normal limits  Creatinine at baseline 4.34-3.8-3.65  Afebrile since 5/2  Most likely due to cefepime related encephalopathy, currently changed to [facility] since 5/6.  Given his CKD 4, cefepime related encephalopathy may be prolonged before resolving.  MRI left MCA/ACA punctate infarct not able to explain the encephalopathy  EEG no seizure, consistent with encephalopathy  No fever,  leukocytosis, or nuchal rigidity to suggest CNS infection.  Has been on antibiotics for 6 days, making CNS infection less likely.  Will hold off LP at this time.  Discussed with daughter and she agrees.  But will repeat CBC, PTT, PT/INR in a.m. in case LP needed.  I discussed with daughter and she is in agreement with LP if needed.  Supportive care  Incidental stroke:   L MCA/ACA border zone punctate subcortical infarct likely secondary to small vessel disease source    CT head No acute abnormality. Small vessel disease. Atrophy.   MRI  L parietal punctate subcortical infarct. Small vessel disease. Atrophy. Chronic L thalamic lacune. Sinus dz.   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 No source of embolus. LA severely dilated. Moderate pleural effusion.   EEG continuous slowing, triphasic waves, generalized, maximal bifrontal.   LDL pending  HgbA1c pending  Heparin 5000 units sq tid for VTE prophylaxis  aspirin 81 mg daily prior to admission, now on aspirin 81 mg daily. No DAPT in case LP needed.   Therapy recommendations:  pending   Disposition:  pending   Hx stroke/TIA  09/2015 - bilateral small infarcts in R and L frontal lobes,  infarcts in setting of uncontrolled BP and glucose as well as dehydration. Infarcts felts to be secondary to small vessel disease source. Put on aspirin 81 and statin.  LDL 148, A1c 10.7 at that time.  TEE no PFO, EF 60 to 65%.  Carotid Doppler negative.  MRA negative.  10/2019 - followed up with Dr. Leonie Man at [facility] for worsening gait, ordered MRI/MRA head and neck.  Bacteremia   Blood culture positive for Pseudomonas 5/1  On cefepime changed to [place] not feel current stroke was related to bacteremia/endocarditis.  Treatment per primary team  Non-STEMI  Admitted for syncope  Found to have elevated troponin  Cardiology on board  Pending cardiac cath  Do not feel  current punctate stroke would be contraindication for cardiac cath.  Hypertension  BP as high as 209/88 yesterday  Stable still on the higher end today . Gradually normalize BP in 2 to 3 days. . Long-term BP goal normotensive  Hyperlipidemia  Home meds:  lipitor 80, resumed in hospital  LDL pending, goal < 70  Continue statin at discharge  Other Stroke Risk Factors  Advanced age  Former Cigarette smoker, quit 41 yrs ago  Smokeless tobacco user - chew  Chronic systolic and diastolic Congestive heart failure. onlasix and BB.   Other Active Problems  NSTEMI. Syncope. Cardiology recommends L heart cath in the future. On tele.   UTI + pseudomonas. On abx.  CKD stage IV  Anemia of chronic dz.  Hospital day # 7  Rosalin Hawking, MD PhD Stroke Neurology 11/13/2019 5:10 PM  I spent  35 minutes in total face-to-face time with the patient, more than 50% of which was spent in counseling and coordination of care, reviewing test results, images and medication, and discussing the diagnosis of encephalopathy, global aphasia, stroke, treatment plan and potential prognosis. This patient's care requiresreview of multiple databases, neurological assessment, discussion with family, other specialists and medical decision making of high complexity. I had long discussion with daughter and girlfriend at bedside, updated pt current condition, treatment plan and potential prognosis, and answered all the questions.  They expressed understanding and appreciation.  I also discussed with primary team over the phone.   To contact Stroke Continuity provider, please refer to [email or web address] After hours, contact General Neurology

## 2019-11-13 NOTE — Evaluation (Signed)
Clinical/Bedside Swallow Evaluation Patient Details  Name: Dennis Macias MRN: [id number] Date of Birth: Jun 21, 1951  Today's Date: 11/13/2019 Time: SLP Start Time (ACUTE ONLY): 1062 SLP Stop Time (ACUTE ONLY): 1528 SLP Time Calculation (min) (ACUTE ONLY): 14 min  Past Medical History:  Past Medical History:  Diagnosis Date  . Anemia   . Arthritis    past hx   . Blindness    right eye  . Cataract    removed both eyes  . Dehydration   . Diabetes ([place])   . Glaucoma   . History of CVA (cerebrovascular accident) 09/13/2015  . History of urinary retention   . Hyperlipidemia   . Hypertension   . Pernicious anemia 02/24/2018  . Stroke [facility])    2017- March  . Syncope 11/2019  . Tachycardia 08/26/2017  . Tubular adenoma of colon 02/2017  . Weight loss, non-intentional 08/26/2017   10 lbs between 6/18 & 2/19   Past Surgical History:  Past Surgical History:  Procedure Laterality Date  . CATARACT EXTRACTION, BILATERAL    . COLONOSCOPY    . IR THORACENTESIS ASP PLEURAL SPACE W/IMG GUIDE  09/21/2019  . IR THORACENTESIS ASP PLEURAL SPACE W/IMG GUIDE  10/16/2019  . POLYPECTOMY    . REFRACTIVE SURGERY  10/2017  . TEE WITHOUT CARDIOVERSION N/A 09/14/2015   Procedure: TRANSESOPHAGEAL ECHOCARDIOGRAM (TEE);  Surgeon: Larey Dresser, MD;  Location: [place];  Service: Cardiovascular;  Laterality: N/A;  . TRANSURETHRAL RESECTION OF PROSTATE N/A 10/20/2019   Procedure: TRANSURETHRAL RESECTION OF THE PROSTATE (TURP);  Surgeon: Irine Seal, MD;  Location: [facility];  Service: Urology;  Laterality: N/A;  . UPPER GASTROINTESTINAL ENDOSCOPY     HPI:  Pt is a 69 y.o. male with a PMHx of CAD, ischemic cardiomyopathy, chronic systolic and diastolic heart failure, CKD stage IV, anemia of chronic disease, and BPH with chronic indwelling foley catheter s/p TURP 10/20/19 who presents here today after a syncopal episode on 4/30. Gradual decrease in his mentation and level of alertness started on 5/6. CT head 5/6: 10 mm  acute infarct within the left parietal lobe subcortical white matter. MRA: No proximal intracranial vessel occlusion or new stenosis.   Assessment / Plan / Recommendation Clinical Impression  Pt was seen for bedside swallow evaluation with his significant other and daughter present. Both parties denied the pt having any baseline symptoms of oropharyngeal dysphagia.  Verbal output was limited to "Oh" and he did not follow the necessary commands for a complete oral mechanism exam. He did not allow oral inspection but family reported that he only has one tooth and does not wear dentures. Encouragement was needed from this SLP and the pt's family to initially accept boluses. No s/sx of aspiration were noted with puree solids or thin liquids via straw. He demonstrated reduced awareness of more advanced solids with limited bolus manipulation and absent mastication. The bolus was ultimately removed from the buccal cavity. A dysphagia 1 (puree) diet with thin liquids is recommended at this time and SLP will continue to follow pt.  SLP Visit Diagnosis: Dysphagia, oral phase (R13.11)    Aspiration Risk  Mild aspiration risk    Diet Recommendation Dysphagia 1 (Puree);Thin liquid   Liquid Administration via: Cup;Straw Medication Administration: Crushed with puree Supervision: Staff to assist with self feeding;Full supervision/cueing for compensatory strategies Compensations: Slow rate;Small sips/bites Postural Changes: Seated upright at 90 degrees    Other  Recommendations Oral Care Recommendations: Oral care BID;Staff/trained caregiver to provide oral care  Follow up Recommendations Other (comment)(TBD)      Frequency and Duration min 2x/week  2 weeks       Prognosis Prognosis for Safe Diet Advancement: Good Barriers to Reach Goals: Cognitive deficits;Language deficits      Swallow Study   General Date of Onset: 11/12/19 HPI: Pt is a 69 y.o. male with a PMHx of CAD, ischemic cardiomyopathy,  chronic systolic and diastolic heart failure, CKD stage IV, anemia of chronic disease, and BPH with chronic indwelling foley catheter s/p TURP 10/20/19 who presents here today after a syncopal episode on 4/30. Gradual decrease in his mentation and level of alertness started on 5/6. CT head 5/6: 10 mm acute infarct within the left parietal lobe subcortical white matter. MRA: No proximal intracranial vessel occlusion or new stenosis. Type of Study: Bedside Swallow Evaluation Previous Swallow Assessment: none Diet Prior to this Study: NPO Temperature Spikes Noted: No Respiratory Status: Room air History of Recent Intubation: No Behavior/Cognition: Alert;Cooperative;Pleasant mood Oral Cavity Assessment: Within Functional Limits Oral Care Completed by SLP: No Oral Cavity - Dentition: Adequate natural dentition Vision: Functional for self-feeding Self-Feeding Abilities: Needs assist Patient Positioning: Upright in bed Baseline Vocal Quality: Normal Volitional Cough: Cognitively unable to elicit Volitional Swallow: Unable to elicit    Oral/Motor/Sensory Function Overall Oral Motor/Sensory Function: Other (comment)(Pt unable to )   [employer] chips: Not tested   Thin Liquid Thin Liquid: Within functional limits Presentation: Straw    Nectar Thick Nectar Thick Liquid: Not tested   Honey Thick Honey Thick Liquid: Not tested   Puree Puree: Within functional limits Presentation: Spoon Oral Phase Functional Implications: Other (comment)(Cues & encouragement needed to accept boluses)   Solid     Solid: Impaired Presentation: (Administered by SLP) Oral Phase Impairments: Impaired mastication;Poor awareness of bolus;Reduced lingual movement/coordination Oral Phase Functional Implications: Impaired mastication     Dangelo Guzzetta I. Hardin Negus, [place], [place] Office number [phone] Pager [place] 11/13/2019,3:47 PM

## 2019-11-14 DIAGNOSIS — I5021 Acute systolic (congestive) heart failure: Secondary | ICD-10-CM

## 2019-11-14 LAB — RENAL FUNCTION PANEL
Albumin: 2 g/dL — ABNORMAL LOW (ref 3.5–5.0)
Anion gap: 13 (ref 5–15)
BUN: 46 mg/dL — ABNORMAL HIGH (ref 8–23)
CO2: 23 mmol/L (ref 22–32)
Calcium: 8.3 mg/dL — ABNORMAL LOW (ref 8.9–10.3)
Chloride: 106 mmol/L (ref 98–111)
Creatinine, Ser: 3.42 mg/dL — ABNORMAL HIGH (ref 0.61–1.24)
GFR calc Af Amer: 20 mL/min — ABNORMAL LOW (ref >60)
GFR calc non Af Amer: 17 mL/min — ABNORMAL LOW (ref >60)
Glucose, Bld: 106 mg/dL — ABNORMAL HIGH (ref 70–99)
Phosphorus: 3.2 mg/dL (ref 2.5–4.6)
Potassium: 3.3 mmol/L — ABNORMAL LOW (ref 3.5–5.1)
Sodium: 142 mmol/L (ref 135–145)

## 2019-11-14 LAB — CBC
HCT: 22.8 % — ABNORMAL LOW (ref 39.0–52.0)
Hemoglobin: 7.6 g/dL — ABNORMAL LOW (ref 13.0–17.0)
MCH: 29.3 pg (ref 26.0–34.0)
MCHC: 33.3 g/dL (ref 30.0–36.0)
MCV: 88 fL (ref 80.0–100.0)
Platelets: 217 10*3/uL (ref 150–400)
RBC: 2.59 MIL/uL — ABNORMAL LOW (ref 4.22–5.81)
RDW: 13.2 % (ref 11.5–15.5)
WBC: 7 10*3/uL (ref 4.0–10.5)
nRBC: 0.3 % — ABNORMAL HIGH (ref 0.0–0.2)

## 2019-11-14 LAB — LIPID PANEL
Cholesterol: 108 mg/dL (ref 0–200)
HDL: 33 mg/dL — ABNORMAL LOW (ref >40)
LDL Cholesterol: 56 mg/dL (ref 0–99)
Total CHOL/HDL Ratio: 3.3 RATIO
Triglycerides: 95 mg/dL (ref <150)
VLDL: 19 mg/dL (ref 0–40)

## 2019-11-14 LAB — PROTIME-INR
INR: 1.5 — ABNORMAL HIGH (ref 0.8–1.2)
Prothrombin Time: 17.6 seconds — ABNORMAL HIGH (ref 11.4–15.2)

## 2019-11-14 LAB — GLUCOSE, CAPILLARY: Glucose-Capillary: 101 mg/dL — ABNORMAL HIGH (ref 70–99)

## 2019-11-14 LAB — HEMOGLOBIN A1C
Hgb A1c MFr Bld: 5.9 % — ABNORMAL HIGH (ref 4.8–5.6)
Mean Plasma Glucose: 122.63 mg/dL

## 2019-11-14 LAB — APTT: aPTT: 31 seconds (ref 24–36)

## 2019-11-14 MED ORDER — CARVEDILOL 12.5 MG PO TABS
12.5000 mg | ORAL_TABLET | Freq: Two times a day (BID) | ORAL | Status: DC
  Filled 2019-11-14: qty 1

## 2019-11-14 MED ORDER — CARVEDILOL 12.5 MG PO TABS
12.5000 mg | ORAL_TABLET | Freq: Two times a day (BID) | ORAL | Status: DC
  Administered 2019-11-14 – 2019-11-16 (×6): 12.5 mg via ORAL
  Filled 2019-11-14 (×5): qty 1

## 2019-11-14 NOTE — Progress Notes (Signed)
STROKE TEAM PROGRESS NOTE   INTERVAL HISTORY He continues to to be quite aphasic saying only "O" "O"   Patient girlfriend and daughter at the bedside Along with RN and speech therapies.  Patient lying in bed, moving all extremities symmetrically, tracking bilaterally, however, not following commands, only making "O" sound for everything.  No fever, labs are stable, BP still on the higher end, no nuchal rigidity.  As per family, patient had rather acute mental status change, aphasia and confusion starting on Wednesday, and continued until today.  Cefepime changed to [facility] yesterday.  Creatinine stable at 3.65.  Passed swallow on nectar thick liquid.  Vitals:   11/14/19 0812 11/14/19 1031 11/14/19 1520 11/14/19 1658  BP: (!) 145/62 (!) 175/83 (!) 142/65 (!) 202/92  Pulse: 68 71 67 72  Resp: 16     Temp: 98.2 F (36.8 C)  98.1 F (36.7 C)   TempSrc: Oral  Oral   SpO2: 98%     Weight:      Height:        CBC:  Recent Labs  Lab 11/12/19 0354 11/14/19 0748  WBC 4.9 7.0  HGB 8.2* 7.6*  HCT 25.9* 22.8*  MCV 91.8 88.0  PLT 218 242    Basic Metabolic Panel:  Recent Labs  Lab 11/13/19 0419 11/14/19 0748  NA 144 142  K 3.6 3.3*  CL 111 106  CO2 18* 23  GLUCOSE 95 106*  BUN 53* 46*  CREATININE 3.65* 3.42*  CALCIUM 8.5* 8.3*  PHOS 3.7 3.2   Lipid Panel:     Component Value Date/Time   CHOL 108 11/14/2019 0748   CHOL 123 10/14/2019 1028   TRIG 95 11/14/2019 0748   HDL 33 (L) 11/14/2019 0748   HDL 65 10/14/2019 1028   CHOLHDL 3.3 11/14/2019 0748   VLDL 19 11/14/2019 0748   LDLCALC 56 11/14/2019 0748   LDLCALC 49 10/14/2019 1028   HgbA1c:  Lab Results  Component Value Date   HGBA1C 5.9 (H) 11/14/2019   Urine Drug Screen:     Component Value Date/Time   LABOPIA NONE DETECTED 07/31/2019 1145   COCAINSCRNUR NONE DETECTED 07/31/2019 1145   LABBENZ NONE DETECTED 07/31/2019 1145   AMPHETMU NONE DETECTED 07/31/2019 1145   THCU NONE DETECTED 07/31/2019 1145    LABBARB NONE DETECTED 07/31/2019 1145    Alcohol Level     Component Value Date/Time   ETH <10 07/31/2019 1019    IMAGING past 24 hours No results found.  PHYSICAL EXAM   Temp:  [98.1 F (36.7 C)-99.9 F (37.7 C)] 98.1 F (36.7 C) (05/08 1520) Pulse Rate:  [62-73] 72 (05/08 1658) Resp:  [14-23] 16 (05/08 0812) BP: (142-202)/(62-140) 202/92 (05/08 1658) SpO2:  [90 %-100 %] 98 % (05/08 0812) Weight:  [72.6 kg] 72.6 kg (05/08 0325)  General - Well nourished, well developed, in no apparent distress.  Ophthalmologic - fundi not visualized due to noncooperation.  Cardiovascular - Regular rhythm and rate.  Neuro - awake alert, tracking bilaterally, no gaze palsy, not blinking to visual threat consistently but no obvious hemianopia.  Still not following commands, only make "O" sound for everything.  Right eye pupil large fixed with corneal clouding, left eye pupil reactive to light 3 mm.  EOMI.  Facial symmetrical.  Moving bilateral upper extremities 3/5, mild asterixis.  Bilateral lower extremity at least 2/5 with pain post ablation.  DTR 1+, no Babinski.  Sensation, coordination and gait not tested.  Brain MRI is reviewed in  person and shows rather severe global atrophy.  There is a small chronic microhemorrhage right frontal lobe.  Acute infarct is seen involving left parasagittal area small size but atypical for lacunar infarct.   ASSESSMENT/PLAN [name] is a 69 y.o. male with history of complicated ischemic cardiomyopathy, chronic diastolic heart failure, stage 4 kidney disease, DM, HTN and prior stroke who was admitted 4/30 for syncope, dx w/ NSTEMI and found to have pseudomonas urosepsis 1/4 BCx. In hospital developed altered mental status (asphaic) w/ facial droop in setting of hypercarbia with extremity tremors. Toxic metabolic encephalopathy vs stroke.   Metabolic encephalopathy  Bilateral upper extremity asterixis  Nonfocal on exam but global aphasia  Ammonia,  ABG, LFT within normal limits  Creatinine at baseline 4.34->3.8->3.65->3.42  Afebrile since 5/2  Most likely due to cefepime related encephalopathy, currently changed to [facility] since 5/6.  Given his CKD 4, cefepime related encephalopathy may be prolonged before resolving.  MRI left MCA/ACA punctate infarct not able to explain the encephalopathy  EEG no seizure, consistent with encephalopathy  No fever, leukocytosis, or nuchal rigidity to suggest CNS infection.  Has been on antibiotics for 6 days, making CNS infection less likely.  Will hold off LP at this time.  Discussed with daughter and she agrees.  But will repeat CBC, PTT, PT/INR in a.m. in case LP needed.  I discussed with daughter and she is in agreement with LP if needed.  Supportive care  Incidental stroke:   L MCA/ACA border zone punctate subcortical infarct likely secondary to small vessel disease source .  However given recurrent stroke within the last several weeks and with dilated left atrium consider embolic as a possibility.  Consider outpatient 30-day cardiac event monitor to evaluate for paroxysmal atrial fibrillation.  CT head No acute abnormality. Small vessel disease. Atrophy.   MRI  L parietal punctate subcortical infarct. Small vessel disease. Atrophy. Chronic L thalamic lacune. Sinus dz.   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 No source of embolus. LA severely dilated. Moderate pleural effusion.   EEG continuous slowing, triphasic waves, generalized, maximal bifrontal.   LDL 56  HgbA1c 5.9  Heparin 5000 units sq tid for VTE prophylaxis  aspirin 81 mg daily prior to admission, now on aspirin 81 mg daily. No DAPT in case LP needed.   Therapy recommendations:  pending   Disposition:  pending   Hx stroke/TIA  09/2015 - bilateral small infarcts in R and L frontal lobes,  infarcts in setting of uncontrolled BP and glucose as well as  dehydration. Infarcts felts to be secondary to small vessel disease source. Put on aspirin 81 and statin.  LDL 148, A1c 10.7 at that time.  TEE no PFO, EF 60 to 65%.  Carotid Doppler negative.  MRA negative.  10/2019 - followed up with Dr. Leonie Man at [facility] for worsening gait, ordered MRI/MRA head and neck.  Bacteremia   Blood culture positive for Pseudomonas 5/1  On cefepime changed to [facility] not feel current stroke was related to bacteremia/endocarditis.  Treatment per primary team  Non-STEMI  Admitted for syncope  Found to have elevated troponin  Cardiology on board  Pending cardiac cath  Do not feel current punctate stroke would be contraindication for cardiac cath.  Hypertension  BP as high as 209/88 yesterday  Stable still on the higher end today . Gradually normalize BP in 2 to 3  days. . Long-term BP goal normotensive  Hyperlipidemia  Home meds:  lipitor 80, resumed in hospital  LDL 56, goal < 70  Continue statin at discharge  Other Stroke Risk Factors  Advanced age  Former Cigarette smoker, quit 41 yrs ago  Smokeless tobacco user - chew     Chronic systolic and diastolic Congestive heart failure. On lasix and BB.   Other Active Problems  NSTEMI. Syncope. Cardiology recommends L heart cath in the future. On tele.   UTI + pseudomonas. Completed Tressie Ellis  CKD stage IV  Anemia of chronic dz. Hb - 8.2->7.6  Hypokalemia - 3.3  Code status - Full code   Hospital day # 8    To contact Stroke Continuity provider, please refer to [email or web address] After hours, contact General Neurology

## 2019-11-14 NOTE — Progress Notes (Addendum)
Subjective: Dennis Macias was seen at beside this AM. He was lying comfortably in bed but he continues to be confused. He responds to questions with "oh" and is unable to follow any commands. There are no other complaints or concerns at this time.   Objective:  Vital signs in last 24 hours: Vitals:   11/14/19 0456 11/14/19 0526 11/14/19 0556 11/14/19 0812  BP: (!) 171/73 (!) 169/83 (!) 170/75 (!) 145/62  Pulse: 70  67 68  Resp: 20 18 14 16   Temp:    98.2 F (36.8 C)  TempSrc:    Oral  SpO2: 91%  93% 98%  Weight:      Height:       General: Elderly male, NAD, laying in bed Cardiac: RRR, no m/r/g Pulmonary: CTABL, no wheezing, rhonchi, rales Abdomen: Soft, non-tender, non-distended Neurologic: Confused, not alert to person, place, or year. Exam was limited to patient having difficulty following commands; unable to assess CN function or strength.  Extremities: No LE edema  Carotid Duplex US, 05/07: Summary:  Right Carotid: Velocities in the right ICA are consistent with a 1-39%  stenosis.   Left Carotid: Velocities in the left ICA are consistent with a 1-39%  stenosis.   Vertebrals: Bilateral vertebral arteries demonstrate antegrade flow.  Subclavians: Normal flow hemodynamics were seen in bilateral subclavian        arteries.     Assessment/Plan:  Dennis Macias is a 69 y.o. male with a history of CAD chronic systolic and diastolic heart failure, CKD stage IV, anemia of chronic disease, and BPH with chronic indwelling Foley catheter status post TURP who presented with a syncopal episode that occurred on 11/06/19, now with acute onset confusion since 05/05, with recent MRI consistent with ischemic infarct of the left parietal region.  Active Problems:   History of stroke   Syncope and collapse   Syncope   Chronic combined systolic and diastolic heart failure (HCC)   Non-STEMI (non-ST elevated myocardial infarction) ([place])   Encephalopathies   Acute ischemic left ICA  stroke ([place])   Bacteremia   Hypertensive urgency   Gait disorder   Ischemic Stroke Altered Mental Status The patient has a history of CVA and concern for a possible facial droop on recent exam. Therefore, CT noncontrast of the head was performed on 05/05; this was unremarkable for acute intracranial abnormality. On 05/06, the patient was evaluated by Cardiology, and a code stroke was called. This was called off as the patient's physical exam was reassuring. On 05/07, we ordered an MRI which showed an ischemic infarct in the left parietal region. No significant stenoses on carotid duplex US. Neurology is currently on board.  There also appears to be a toxic-metabolic component contributing to his new onset altered mental status; recent EEG showed moderate diffuse encephalopathy consistent with this. This could be as a result of his recent infection versus delirium versus medication adverse effects. Given that an adverse effect of cefepime is confusion, he was transitioned to ceftazidime on 05/06. He is now on day 7/7 of treatment for UTI and 1/4 blood cultures positive for Pseudomonas. We also discontinued LR and have been giving the patient bicarb through his peripheral line (bicarb is 23 today, from 18 yesterday). We counseled his son yesterday that it may take some time to see improvement in his mental status given delayed clearance of medications (i.e.cefepime) as a result of his CKD.   -Neurology on board, appreciate their recs -Patient underwent speech eval; can resume  PO meds at this time.   Syncope: Patient had an episode of syncope that occurred during breakfast on 04/30; no presyncopal times or seizure activity noted. Reported increase in his home BP medications on 04/29 and he had just taken his medications prior to the episode. Labs were significant for troponin elevation of 1600 > 2300 > 2200. Differential includes arrhythmia and CAD; cardiology is on board and planning catheterization  although his renal function makes this challenging given risk of contrast nephropathy.   -Cardiology following, appreciate recommendations -Cardiology recommends a left heart cath eventually; will hold off for now given acutely altered mental status. -Continue telemetry   UA showing pyuria; urine cultures positive for Pseudomonas 1/4 blood cultures positive for Pseudomonas  Last weekend, the patient developed fevers, chills, and some diaphoresis. He is otherwise asymptomatic and is currently afebrile. The exact etiology is unclear at this time, though his indwelling catheter could be a potential source of infection. He is on day 6 of his antibiotic regimen for urine cultures positive for cefepime and ceftazidime-sensitive Pseudomonas species. Additionally, 1/4 blood cultures grew Pseudomonas species which were sensitive to cefepime and ceftazidime. We will continue his ceftazidime regimen at this time (day 7/7).  -Ceftazidime, on day 7/7 of antibiotic regimen. -Tylenol for fevers  -Repeat urine culture pending   CKD stage IV: Creatinine on admission was 3.84, near his baseline. Creatinine has been 3.84>>3.94>>4.12>>4.34>>4.18>>4.03>>3.90>>3.94>>3.65>>3.42 today. Nephrology is currently on board. His recently rising creatinine could be the result of pre-renal (prior Lasix regimen vs. CKD progression 2/2 DM) and post-renal AKI 2/2 BPH. On 05/02, bladder scan showed 400 mL but an accurate PVR was unable to be obtained; subsequent bladder scan showed 313 mL. Repeat bladder scan was 267.  -Nephrology consulted; appreciate their recs -Sodium bicarbonate   Hypertension: Patient recently had his hydralazine and isosorbide medications increased at home before his syncopal episode. However, the patient remains hypertensive at this time. We will continue antihypertensive regimen with nitro, coreg 6.25mg  bid, hydral 100 tid, isordil 40mg  tid for a goal SBP of 160s-180s.  -Continue BP regimen.    Chronic systolic and diastolic heart failure: BNP on admission elevated to>4000. Was on Lasix initially, but this was recently discontinued given climbing Scr levels. He is euvolemic on examination today and had adequate urine output of ~1500 mL in the past 24 hours. Last recorded dry weight was 80.9 kg in Feb 2021, although his weight was recorded at 70.3>> 80 kg>>80 kg>> 72.5>>74.4kg >>72.3 kg > 72.8 kg today. Will continue to monitor his urine output and weight changes during his admission.  -Holding home Lasix -Continue beta blocker regimen -Strict I's/O's -Daily weights   Anemia of chronic disease: -Continue ferrous gluconate -Aranesp per nephrology   Prior to Admission Living Arrangement: Home Anticipated Discharge Location: TBD Barriers to Discharge: Pending further evalaution  Orvis Brill, Medical Student 11/14/2019, 9:13 AM Pager: [phone]

## 2019-11-14 NOTE — Progress Notes (Signed)
Physical Therapy Treatment Patient Details Name: Dennis Macias MRN: [id number] DOB: 12/07/1950 Today's Date: 11/14/2019    History of Present Illness Pt is a 69 y.o. male with a PMHx of CAD, ischemic cardiomyopathy, chronic systolic and diastolic heart failure, CKD stage IV, anemia of chronic disease, and BPH with chronic indwelling foley catheter s/p TURP 10/20/19. Came to ED after syncopal episode found to be hypertensive and febrile. Admitted 11/06/19. Plan for cardiac cath however creatinine levels elevated. Cardiology recommends a left heart cath eventually; will hold off for now given acutely altered mental status. Of note, pt with new AMS and a code stroke was called on 5/6. MRI of the brain revealed ischemic infarct in the left parietal region.    PT Comments    Pt seen for re-evaluation since new CVA found on 5/6. Pt now presenting with more significant deficits including global aphasia, balance, coordination and independence with functional mobility. This session pt required heavy physical assistance for rolling and to achieve upright sitting at EOB. He required multimodal cueing throughout but still with great difficulty following commands. Pt only responding with an "O" sound to all questions/commands. Pt's girlfriend was present throughout, very attentive and encouraging. Based on pt's current functional mobility status and newly found CVA, recommendations have been updated to CIR for further intensive therapy services prior to returning home with family support. Pt would continue to benefit from skilled physical therapy services at this time while admitted and after d/c to address the below listed limitations in order to improve overall safety and independence with functional mobility.   Follow Up Recommendations  CIR;Supervision/Assistance - 24 hour     Equipment Recommendations  Other (comment)(defer to next venue of care)    Recommendations for Other Services Rehab consult      Precautions / Restrictions Precautions Precautions: Fall Precaution Comments: global aphasia Restrictions Weight Bearing Restrictions: No    Mobility  Bed Mobility Overal bed mobility: Needs Assistance Bed Mobility: Rolling;Sidelying to Sit;Sit to Sidelying Rolling: Mod assist;Max assist Sidelying to sit: Mod assist     Sit to sidelying: Min guard General bed mobility comments: multimodal cueing required for pt to complete tasks; pt rolling bilaterally in bed for repositioning and pad placement; heavy physical assist needed more due to cognitive deficits. Pt prematurely lying back down in bed in R sidelying without any physical assistance needed  Transfers                 General transfer comment: pt prematurely lying back down in bed after very briefly sitting EOB (~4-5 seconds)  Ambulation/Gait                 Stairs             Wheelchair Mobility    Modified Rankin (Stroke Patients Only) Modified Rankin (Stroke Patients Only) Pre-Morbid Rankin Score: Moderate disability Modified Rankin: Severe disability     Balance Overall balance assessment: Needs assistance Sitting-balance support: Feet supported Sitting balance-Leahy Scale: Fair                                      Cognition Arousal/Alertness: Awake/alert Behavior During Therapy: Flat affect Overall Cognitive Status: Difficult to assess Area of Impairment: Following commands                       Following Commands: Follows one step commands inconsistently  Exercises      General Comments        Pertinent Vitals/Pain Pain Assessment: Faces Faces Pain Scale: No hurt Pain Intervention(s): Monitored during session    Home Living                      Prior Function            PT Goals (current goals can now be found in the care plan section) Acute Rehab PT Goals PT Goal Formulation: With patient Time For Goal  Achievement: 11/24/19 Potential to Achieve Goals: Good Progress towards PT goals: Progressing toward goals    Frequency    Min 4X/week      PT Plan Discharge plan needs to be updated    Co-evaluation              AM-PAC PT "6 Clicks" Mobility   Outcome Measure  Help needed turning from your back to your side while in a flat bed without using bedrails?: A Lot Help needed moving from lying on your back to sitting on the side of a flat bed without using bedrails?: A Lot Help needed moving to and from a bed to a chair (including a wheelchair)?: A Lot Help needed standing up from a chair using your arms (e.g., wheelchair or bedside chair)?: A Lot Help needed to walk in hospital room?: Total Help needed climbing 3-5 steps with a railing? : Total 6 Click Score: 10    End of Session   Activity Tolerance: Patient tolerated treatment well Patient left: in bed;with call bell/phone within reach;with bed alarm set;with family/visitor present Nurse Communication: Mobility status PT Visit Diagnosis: Other abnormalities of gait and mobility (R26.89)     Time: 5852-7782 PT Time Calculation (min) (ACUTE ONLY): 21 min  Charges:                        Anastasio Champion, DPT  Acute Rehabilitation Services Pager [phone] Office [place] 11/14/2019, 3:41 PM

## 2019-11-14 NOTE — Progress Notes (Signed)
Patient ID: Dennis Macias, male   DOB: 1951/06/09, 69 y.o.   MRN: [id number] S: No events overnight.  Still only responds by saying "O" O:BP (!) 175/83   Pulse 71   Temp 98.2 F (36.8 C) (Oral)   Resp 16   Ht 6' (1.829 m)   Wt 72.6 kg   SpO2 98%   BMI 21.71 kg/m   Intake/Output Summary (Last 24 hours) at 11/14/2019 1148 Last data filed at 11/14/2019 0517 Gross per 24 hour  Intake --  Output 1150 ml  Net -1150 ml   Intake/Output: I/O last 3 completed shifts: In: 1392.2 [I.V.:1392.2] Out: 2225 [Urine:2225]  Intake/Output this shift:  No intake/output data recorded. Weight change: -0.2 kg Gen: NAD CVS: no rub Resp: CTA Abd: +BS, soft, NT/ND Ext: no edema  Recent Labs  Lab 11/10/19 0349 11/11/19 0508 11/11/19 1444 11/12/19 0354 11/12/19 1039 11/13/19 0419 11/14/19 0748  NA 138 140 140 141 143 144 142  K 3.4* 4.1 4.0 4.0 4.0 3.6 3.3*  CL 111 111 113* 117* 115* 111 106  CO2 19* 16* 16* 16* 16* 18* 23  GLUCOSE 118* 93 108* 104* 108* 95 106*  BUN 60* 60* 58* 57* 56* 53* 46*  CREATININE 4.18* 4.03* 3.88* 3.90* 3.94* 3.65* 3.42*  ALBUMIN 1.9* 2.0* 2.0* 1.9* 1.9* 2.0* 2.0*  CALCIUM 8.2* 8.3* 8.0* 8.2* 8.5* 8.5* 8.3*  PHOS 4.5 3.9 4.0 4.0  --  3.7 3.2  AST  --   --   --   --  12*  --   --   ALT  --   --   --   --  10  --   --    Liver Function Tests: Recent Labs  Lab 11/12/19 1039 11/13/19 0419 11/14/19 0748  AST 12*  --   --   ALT 10  --   --   ALKPHOS 42  --   --   BILITOT 1.1  --   --   PROT 5.1*  --   --   ALBUMIN 1.9* 2.0* 2.0*   No results for input(s): LIPASE, AMYLASE in the last 168 hours. Recent Labs  Lab 11/13/19 0419  AMMONIA 18   CBC: Recent Labs  Lab 11/08/19 0445 11/08/19 0445 11/10/19 0349 11/10/19 0349 11/11/19 0508 11/12/19 0354 11/14/19 0748  WBC 8.2   < > 5.1   < > 5.1 4.9 7.0  HGB 8.1*   < > 8.2*   < > 8.3* 8.2* 7.6*  HCT 24.5*   < > 25.1*   < > 25.2* 25.9* 22.8*  MCV 90.4  --  91.6  --  91.6 91.8 88.0  PLT 193   < > 210   < > 210  218 217   < > = values in this interval not displayed.   Cardiac Enzymes: No results for input(s): CKTOTAL, CKMB, CKMBINDEX, TROPONINI in the last 168 hours. CBG: Recent Labs  Lab 11/11/19 1327 11/11/19 2110 11/12/19 0750 11/12/19 1655 11/14/19 0755  GLUCAP 92 99 97 97 101*    Iron Studies: No results for input(s): IRON, TIBC, TRANSFERRIN, FERRITIN in the last 72 hours. Studies/Results: EEG  Result Date: 11/12/2019 Lora Havens, MD     11/12/2019  1:51 PM Patient Name: Dennis Macias MRN: [id number] Epilepsy Attending: Lora Havens Referring Physician/Provider: Dr. Kerney Elbe Date: 11/12/2019 Duration: 24.37 mins Patient history: 69 year old male with progressive decline in cognitive function over past 24 hours.  EEG evaluate for seizures. Level of  alertness: awake AEDs during EEG study: None Technical aspects: This EEG study was done with scalp electrodes positioned according to the 10-20 International system of electrode placement. Electrical activity was acquired at a sampling rate of 500Hz  and reviewed with a high frequency filter of 70Hz  and a low frequency filter of 1Hz . EEG data were recorded continuously and digitally stored. Description: No clear posterior dominant was seen. EEG showed continuous generalized 3 to 5 cm in delta slowing.  Triphasic waves, generalized, maximal bifrontal were also seen.  Hyperventilation and photic stimulation were not performed. Abnormality - Continuous slow, generalized - Triphasic waves, generalized, maximal bifrontal IMPRESSION: This study is suggestive of moderate diffuse encephalopathy, nonspecific etiology but likely secondary to toxic-metabolic causes. No seizures or definite epileptiform discharges were seen throughout the recording. Lora Havens   MR ANGIO HEAD WO CONTRAST  Result Date: 11/13/2019 CLINICAL DATA:  Stroke. EXAM: MRA HEAD WITHOUT CONTRAST TECHNIQUE: Angiographic images of the Circle of Willis were obtained using MRA technique  without intravenous contrast. COMPARISON:  2017 FINDINGS: Motion artifact is present. Intracranial internal carotid arteries are patent with atherosclerotic irregularity. Middle and anterior cerebral arteries are patent. Left A1 ACA is dominant. Stable tiny outpouching near the expected location an anterior communicating artery. This is unchanged. Included intracranial vertebral arteries, basilar artery, posterior cerebral arteries are patent. There is no significant stenosis identified. IMPRESSION: No proximal intracranial vessel occlusion or new stenosis. Unchanged tiny vascular protrusion from the expected region an anterior communicating artery. This may reflect a tiny aneurysm or could be contiguous with unseen diminutive right A1 ACA. Electronically Signed   By: Macy Mis M.D.   On: 11/13/2019 12:23   MR BRAIN WO CONTRAST  Result Date: 11/12/2019 CLINICAL DATA:  Encephalopathy. Additional provided: Altered mental status. EXAM: MRI HEAD WITHOUT CONTRAST TECHNIQUE: Multiplanar, multiecho pulse sequences of the brain and surrounding structures were obtained without intravenous contrast. COMPARISON:  Noncontrast head CT 11/11/2019, brain MRI 11/12/2015. FINDINGS: Brain: Multiple sequences are significantly motion degraded, limiting evaluation. Most notably, there is moderate/severe motion degradation of the axial T2/FLAIR sequence, moderate/severe motion degradation of the coronal T2 weighted sequence and moderate motion degradation of the sagittal T1 weighted sequence. There is a 10 mm focus of restricted diffusion consistent with acute infarction with left parietal white matter (series 4, image 16). Advanced patchy and confluent T2/FLAIR hyperintensity within the cerebral white matter is nonspecific, but consistent with chronic small vessel ischemic disease. Findings have progressed as compared to prior MRI 09/12/2015. Chronic small vessel ischemic changes within the basal ganglia have also progressed.  There is a tiny chronic left thalamic lacunar infarct which was not present on the prior MRI. There are few scattered punctate chronic microhemorrhages within bilateral cerebral hemispheres, nonspecific. Stable, mild generalized parenchymal atrophy. Vascular: Expected proximal arterial flow voids. Skull and upper cervical spine: No focal marrow lesion. Sinuses/Orbits: Visualized orbits show no acute finding. Minimal ethmoid and maxillary sinus mucosal thickening. No significant mastoid effusion. IMPRESSION: 1. Significantly motion degraded examination as described. 2. 10 mm acute infarct within the left parietal lobe subcortical white matter. 3. Advanced chronic small vessel ischemic changes most notably within the cerebral white matter, progressed as compared to MRI 09/12/2015. A chronic left thalamic lacunar infarct was not present on this prior exam. 4. Stable, mild generalized parenchymal atrophy. 5. Minimal ethmoid and maxillary sinus mucosal thickening. Electronically Signed   By: Kellie Simmering DO   On: 11/12/2019 12:29   VAS US CAROTID  Result Date: 11/13/2019 Carotid  Arterial Duplex Study Indications:       CVA and Syncope. Risk Factors:      Hypertension, coronary artery disease. Comparison Study:  carotid 09/13/2015 - 1-39% Performing Technologist: Velva Harman Sturdivant RDMS, RVT  Examination Guidelines: A complete evaluation includes B-mode imaging, spectral Doppler, color Doppler, and power Doppler as needed of all accessible portions of each vessel. Bilateral testing is considered an integral part of a complete examination. Limited examinations for reoccurring indications may be performed as noted.  Right Carotid Findings: +----------+--------+--------+--------+------------------+---------------------+           PSV cm/sEDV cm/sStenosisPlaque DescriptionComments              +----------+--------+--------+--------+------------------+---------------------+ CCA Prox  76      9                                                        +----------+--------+--------+--------+------------------+---------------------+ CCA Distal91      11                                intimal thickening    +----------+--------+--------+--------+------------------+---------------------+ ICA Prox  154     19      1-39%   calcific          elevated PSV with                                                         significant stenosis                                                      noted                 +----------+--------+--------+--------+------------------+---------------------+ ICA Mid   95      19      1-39%                                           +----------+--------+--------+--------+------------------+---------------------+ ICA Distal40      13                                                      +----------+--------+--------+--------+------------------+---------------------+ ECA       199                                                             +----------+--------+--------+--------+------------------+---------------------+ +----------+--------+-------+----------------+-------------------+           PSV cm/sEDV cmsDescribe        Arm Pressure (  mmHG) +----------+--------+-------+----------------+-------------------+ Subclavian130            Multiphasic, WNL                    +----------+--------+-------+----------------+-------------------+ +---------+--------+--+--------+--+---------+ VertebralPSV cm/s64EDV cm/s23Antegrade +---------+--------+--+--------+--+---------+  Left Carotid Findings: +----------+--------+--------+--------+------------------+---------------------+           PSV cm/sEDV cm/sStenosisPlaque DescriptionComments              +----------+--------+--------+--------+------------------+---------------------+ CCA Prox  75      13                                                       +----------+--------+--------+--------+------------------+---------------------+ CCA Distal178     31              hypoechoic        elevated PSV without                                                      significant stenosis  +----------+--------+--------+--------+------------------+---------------------+ ICA Prox  107     18      1-39%   hypoechoic                              +----------+--------+--------+--------+------------------+---------------------+ ICA Distal62      15                                                      +----------+--------+--------+--------+------------------+---------------------+ ECA       81                                                              +----------+--------+--------+--------+------------------+---------------------+ +----------+--------+--------+----------------+-------------------+           PSV cm/sEDV cm/sDescribe        Arm Pressure (mmHG) +----------+--------+--------+----------------+-------------------+ [id number]             Multiphasic, WNL                    +----------+--------+--------+----------------+-------------------+ +---------+--------+--+--------+--+---------+ VertebralPSV cm/s32EDV cm/s11Antegrade +---------+--------+--+--------+--+---------+   Summary: Right Carotid: Velocities in the right ICA are consistent with a 1-39% stenosis. Left Carotid: Velocities in the left ICA are consistent with a 1-39% stenosis. Vertebrals:  Bilateral vertebral arteries demonstrate antegrade flow. Subclavians: Normal flow hemodynamics were seen in bilateral subclavian              arteries. *See table(s) above for measurements and observations.     Preliminary    . aspirin EC  81 mg Oral Daily  . atorvastatin  80 mg Oral QHS  . carvedilol  12.5 mg Oral BID WC  . darbepoetin (ARANESP) injection - NON-DIALYSIS  40 mcg Subcutaneous Q Tue-1800  . dorzolamide-timolol  1 drop Right Eye BID  . ferrous  gluconate  324 mg Oral Q breakfast  . finasteride  5 mg Oral Daily  . heparin  5,000 Units Subcutaneous Q8H  . hydrALAZINE  100 mg Oral TID  . lidocaine  5 mL Intradermal Once  . mouth rinse  15 mL Mouth Rinse BID  . sodium chloride flush  3 mL Intravenous Q12H  . tamsulosin  0.4 mg Oral QPC supper  . vitamin B-12  1,000 mcg Oral Daily    BMET    Component Value Date/Time   NA 142 11/14/2019 0748   NA 140 08/13/2019 1139   K 3.3 (L) 11/14/2019 0748   CL 106 11/14/2019 0748   CO2 23 11/14/2019 0748   GLUCOSE 106 (H) 11/14/2019 0748   BUN 46 (H) 11/14/2019 0748   BUN 36 (H) 08/13/2019 1139   CREATININE 3.42 (H) 11/14/2019 0748   CALCIUM 8.3 (L) 11/14/2019 0748   GFRNONAA 17 (L) 11/14/2019 0748   GFRAA 20 (L) 11/14/2019 0748   CBC    Component Value Date/Time   WBC 7.0 11/14/2019 0748   RBC 2.59 (L) 11/14/2019 0748   HGB 7.6 (L) 11/14/2019 0748   HGB 7.7 (L) 08/13/2019 1139   HCT 22.8 (L) 11/14/2019 0748   HCT 22.3 (L) 08/13/2019 1139   PLT 217 11/14/2019 0748   PLT 392 08/13/2019 1139   MCV 88.0 11/14/2019 0748   MCV 86 08/13/2019 1139   MCH 29.3 11/14/2019 0748   MCHC 33.3 11/14/2019 0748   RDW 13.2 11/14/2019 0748   RDW 14.4 08/13/2019 1139   LYMPHSABS 1.1 11/06/2019 0935   LYMPHSABS 2.0 02/24/2018 1142   MONOABS 0.6 11/06/2019 0935   EOSABS 0.0 11/06/2019 0935   EOSABS 0.1 02/24/2018 1142   BASOSABS 0.0 11/06/2019 0935   BASOSABS 0.0 02/24/2018 1142    Assessment/Plan:  1. AMS- per neuro most likely due to cefepime related encephalopathy.  Changed to [facility] 11/12/19. 2. New Left parietal infarct seen on MRI - per Neuro, likely due to small vessel disease and not likely to have caused his AMS and speech issues.  Neuro following.  Holding off on LP for now. 3. AKI/CKD stage 4- following a syncopal episode.Baseline Scr appears to be 3.3-3.7 and is slowly improving with IVF's. Will decrease rate to 59ml/hr. Scr has improved back to his baseline Scr.   4. Syncope/NSTEMI- possible arrythmia or could have been TIA. Cardiology consulted and wanted to proceed with cardiac cath, however given recent stroke that is on hold. 5. CKD stage 4- presumably due to DM and followed by Dr. Hollie Salk in our office and baseline Scr 3.3-3.7. 6. HTN- increased hydralazine but BP remains elevated. Cont to follow and lower slowly with recent ischemic stroke. 7. Fever- Tmax 100.5 last night. Afebrile today. Urine culture + for pseudomonas aeruginosa sensitive to cefepime and ceftaz, cipro. Started on cefepime and now on ceftaz. 8. Anemia of CKD-started aranesp 11/10/19, iron stores stable.  Donetta Potts, MD [employer] [phone]

## 2019-11-14 NOTE — Progress Notes (Signed)
Progress Note  Patient Name: Dennis Macias Date of Encounter: 11/14/2019  Primary Cardiologist: Larae Grooms, MD   Subjective   No complaints  Inpatient Medications    Scheduled Meds: . aspirin EC  81 mg Oral Daily  . atorvastatin  80 mg Oral QHS  . carvedilol  6.25 mg Oral BID WC  . darbepoetin (ARANESP) injection - NON-DIALYSIS  40 mcg Subcutaneous Q Tue-1800  . dorzolamide-timolol  1 drop Right Eye BID  . ferrous gluconate  324 mg Oral Q breakfast  . finasteride  5 mg Oral Daily  . heparin  5,000 Units Subcutaneous Q8H  . hydrALAZINE  100 mg Oral TID  . isosorbide dinitrate  40 mg Oral TID  . lidocaine  5 mL Intradermal Once  . mouth rinse  15 mL Mouth Rinse BID  . sodium chloride flush  3 mL Intravenous Q12H  . tamsulosin  0.4 mg Oral QPC supper  . vitamin B-12  1,000 mcg Oral Daily   Continuous Infusions: . sodium chloride    . sodium chloride    . cefTAZidime (FORTAZ)  IV 2 g (11/13/19 1335)  . nitroGLYCERIN 40 mcg/min (11/14/19 0713)  .  sodium bicarbonate (isotonic) infusion in sterile water 75 mL/hr at 11/14/19 0438   PRN Meds: sodium chloride, acetaminophen **OR** acetaminophen, hydrALAZINE, LORazepam, sodium chloride flush   Vital Signs    Vitals:   11/14/19 0426 11/14/19 0456 11/14/19 0526 11/14/19 0556  BP: (!) 173/73 (!) 171/73 (!) 169/83 (!) 170/75  Pulse: 64 70  67  Resp: 14 20 18 14   Temp:      TempSrc:      SpO2: 90% 91%  93%  Weight:      Height:        Intake/Output Summary (Last 24 hours) at 11/14/2019 0755 Last data filed at 11/14/2019 0517 Gross per 24 hour  Intake 347.89 ml  Output 1150 ml  Net -802.11 ml   Last 3 Weights 11/14/2019 11/13/2019 11/12/2019  Weight (lbs) 160 lb 0.9 oz 160 lb 7.9 oz 159 lb 6.4 oz  Weight (kg) 72.6 kg 72.8 kg 72.303 kg      Telemetry    SR - Personally Reviewed  ECG    n/a - Personally Reviewed  Physical Exam   GEN: No acute distress.   Neck: No JVD Cardiac: RRR, no murmurs, rubs, or gallops.    Respiratory: Clear to auscultation bilaterally. GI: Soft, nontender, non-distended  MS: No edema; No deformity. Psych: Normal affect   Labs    High Sensitivity Troponin:   Recent Labs  Lab 11/06/19 0935 11/06/19 1924 11/06/19 2235  TROPONINIHS 1,618* 2,384* 2,233*      Chemistry Recent Labs  Lab 11/12/19 0354 11/12/19 1039 11/13/19 0419  NA 141 143 144  K 4.0 4.0 3.6  CL 117* 115* 111  CO2 16* 16* 18*  GLUCOSE 104* 108* 95  BUN 57* 56* 53*  CREATININE 3.90* 3.94* 3.65*  CALCIUM 8.2* 8.5* 8.5*  PROT  --  5.1*  --   ALBUMIN 1.9* 1.9* 2.0*  AST  --  12*  --   ALT  --  10  --   ALKPHOS  --  42  --   BILITOT  --  1.1  --   GFRNONAA 15* 15* 16*  GFRAA 17* 17* 19*  ANIONGAP 8 12 15      Hematology Recent Labs  Lab 11/10/19 0349 11/11/19 0508 11/12/19 0354  WBC 5.1 5.1 4.9  RBC 2.74* 2.75*  2.82*  HGB 8.2* 8.3* 8.2*  HCT 25.1* 25.2* 25.9*  MCV 91.6 91.6 91.8  MCH 29.9 30.2 29.1  MCHC 32.7 32.9 31.7  RDW 13.2 13.1 13.0  PLT 210 210 218    BNPNo results for input(s): BNP, PROBNP in the last 168 hours.   DDimer No results for input(s): DDIMER in the last 168 hours.   Radiology    EEG  Result Date: 11/12/2019 Lora Havens, MD     11/12/2019  1:51 PM Patient Name: Dennis Macias MRN: [id number] Epilepsy Attending: Lora Havens Referring Physician/Provider: Dr. Kerney Elbe Date: 11/12/2019 Duration: 24.37 mins Patient history: 69 year old male with progressive decline in cognitive function over past 24 hours.  EEG evaluate for seizures. Level of alertness: awake AEDs during EEG study: None Technical aspects: This EEG study was done with scalp electrodes positioned according to the 10-20 International system of electrode placement. Electrical activity was acquired at a sampling rate of 500Hz  and reviewed with a high frequency filter of 70Hz  and a low frequency filter of 1Hz . EEG data were recorded continuously and digitally stored. Description: No clear posterior  dominant was seen. EEG showed continuous generalized 3 to 5 cm in delta slowing.  Triphasic waves, generalized, maximal bifrontal were also seen.  Hyperventilation and photic stimulation were not performed. Abnormality - Continuous slow, generalized - Triphasic waves, generalized, maximal bifrontal IMPRESSION: This study is suggestive of moderate diffuse encephalopathy, nonspecific etiology but likely secondary to toxic-metabolic causes. No seizures or definite epileptiform discharges were seen throughout the recording. Lora Havens   MR ANGIO HEAD WO CONTRAST  Result Date: 11/13/2019 CLINICAL DATA:  Stroke. EXAM: MRA HEAD WITHOUT CONTRAST TECHNIQUE: Angiographic images of the Circle of Willis were obtained using MRA technique without intravenous contrast. COMPARISON:  2017 FINDINGS: Motion artifact is present. Intracranial internal carotid arteries are patent with atherosclerotic irregularity. Middle and anterior cerebral arteries are patent. Left A1 ACA is dominant. Stable tiny outpouching near the expected location an anterior communicating artery. This is unchanged. Included intracranial vertebral arteries, basilar artery, posterior cerebral arteries are patent. There is no significant stenosis identified. IMPRESSION: No proximal intracranial vessel occlusion or new stenosis. Unchanged tiny vascular protrusion from the expected region an anterior communicating artery. This may reflect a tiny aneurysm or could be contiguous with unseen diminutive right A1 ACA. Electronically Signed   By: Macy Mis M.D.   On: 11/13/2019 12:23   MR BRAIN WO CONTRAST  Result Date: 11/12/2019 CLINICAL DATA:  Encephalopathy. Additional provided: Altered mental status. EXAM: MRI HEAD WITHOUT CONTRAST TECHNIQUE: Multiplanar, multiecho pulse sequences of the brain and surrounding structures were obtained without intravenous contrast. COMPARISON:  Noncontrast head CT 11/11/2019, brain MRI 11/12/2015. FINDINGS: Brain:  Multiple sequences are significantly motion degraded, limiting evaluation. Most notably, there is moderate/severe motion degradation of the axial T2/FLAIR sequence, moderate/severe motion degradation of the coronal T2 weighted sequence and moderate motion degradation of the sagittal T1 weighted sequence. There is a 10 mm focus of restricted diffusion consistent with acute infarction with left parietal white matter (series 4, image 16). Advanced patchy and confluent T2/FLAIR hyperintensity within the cerebral white matter is nonspecific, but consistent with chronic small vessel ischemic disease. Findings have progressed as compared to prior MRI 09/12/2015. Chronic small vessel ischemic changes within the basal ganglia have also progressed. There is a tiny chronic left thalamic lacunar infarct which was not present on the prior MRI. There are few scattered punctate chronic microhemorrhages within bilateral cerebral hemispheres, nonspecific. Stable, mild  generalized parenchymal atrophy. Vascular: Expected proximal arterial flow voids. Skull and upper cervical spine: No focal marrow lesion. Sinuses/Orbits: Visualized orbits show no acute finding. Minimal ethmoid and maxillary sinus mucosal thickening. No significant mastoid effusion. IMPRESSION: 1. Significantly motion degraded examination as described. 2. 10 mm acute infarct within the left parietal lobe subcortical white matter. 3. Advanced chronic small vessel ischemic changes most notably within the cerebral white matter, progressed as compared to MRI 09/12/2015. A chronic left thalamic lacunar infarct was not present on this prior exam. 4. Stable, mild generalized parenchymal atrophy. 5. Minimal ethmoid and maxillary sinus mucosal thickening. Electronically Signed   By: Kellie Simmering DO   On: 11/12/2019 12:29   VAS US CAROTID  Result Date: 11/13/2019 Carotid Arterial Duplex Study Indications:       CVA and Syncope. Risk Factors:      Hypertension, coronary artery  disease. Comparison Study:  carotid 09/13/2015 - 1-39% Performing Technologist: Velva Harman Sturdivant RDMS, RVT  Examination Guidelines: A complete evaluation includes B-mode imaging, spectral Doppler, color Doppler, and power Doppler as needed of all accessible portions of each vessel. Bilateral testing is considered an integral part of a complete examination. Limited examinations for reoccurring indications may be performed as noted.  Right Carotid Findings: +----------+--------+--------+--------+------------------+---------------------+           PSV cm/sEDV cm/sStenosisPlaque DescriptionComments              +----------+--------+--------+--------+------------------+---------------------+ CCA Prox  76      9                                                       +----------+--------+--------+--------+------------------+---------------------+ CCA Distal91      11                                intimal thickening    +----------+--------+--------+--------+------------------+---------------------+ ICA Prox  154     19      1-39%   calcific          elevated PSV with                                                         significant stenosis                                                      noted                 +----------+--------+--------+--------+------------------+---------------------+ ICA Mid   95      19      1-39%                                           +----------+--------+--------+--------+------------------+---------------------+ ICA Distal40      13                                                      +----------+--------+--------+--------+------------------+---------------------+  ECA       199                                                             +----------+--------+--------+--------+------------------+---------------------+ +----------+--------+-------+----------------+-------------------+           PSV cm/sEDV cmsDescribe         Arm Pressure (mmHG) +----------+--------+-------+----------------+-------------------+ Subclavian130            Multiphasic, WNL                    +----------+--------+-------+----------------+-------------------+ +---------+--------+--+--------+--+---------+ VertebralPSV cm/s64EDV cm/s23Antegrade +---------+--------+--+--------+--+---------+  Left Carotid Findings: +----------+--------+--------+--------+------------------+---------------------+           PSV cm/sEDV cm/sStenosisPlaque DescriptionComments              +----------+--------+--------+--------+------------------+---------------------+ CCA Prox  75      13                                                      +----------+--------+--------+--------+------------------+---------------------+ CCA Distal178     31              hypoechoic        elevated PSV without                                                      significant stenosis  +----------+--------+--------+--------+------------------+---------------------+ ICA Prox  107     18      1-39%   hypoechoic                              +----------+--------+--------+--------+------------------+---------------------+ ICA Distal62      15                                                      +----------+--------+--------+--------+------------------+---------------------+ ECA       81                                                              +----------+--------+--------+--------+------------------+---------------------+ +----------+--------+--------+----------------+-------------------+           PSV cm/sEDV cm/sDescribe        Arm Pressure (mmHG) +----------+--------+--------+----------------+-------------------+ [id number]             Multiphasic, WNL                    +----------+--------+--------+----------------+-------------------+ +---------+--------+--+--------+--+---------+ VertebralPSV cm/s32EDV  cm/s11Antegrade +---------+--------+--+--------+--+---------+   Summary: Right Carotid: Velocities in the right ICA are consistent with a 1-39% stenosis. Left Carotid: Velocities in the left ICA are consistent with a 1-39% stenosis. Vertebrals:  Bilateral vertebral arteries demonstrate  antegrade flow. Subclavians: Normal flow hemodynamics were seen in bilateral subclavian              arteries. *See table(s) above for measurements and observations.     Preliminary     Cardiac Studies     Patient Profile     69 y.o. male with past medical history of coronary artery disease, ischemic cardiomyopathy, hypertension, hyperlipidemia, chronic stage IV kidney disease, chronic indwelling Foley catheter, history of CVA for evaluation of syncope. Echocardiogram April 2021 showed ejection fraction 40 to 24%, grade 1 diastolic dysfunction, severe left atrial enlargement, mild mitral regurgitation. Nuclear study April 2021 showed ejection fraction 40%. There was apical infarction with peri-infarct ischemia. Patient has been treated medically due to renal insufficiency and lack of symptoms.  Assessment & Plan    1. CVA - - Patient with obvious AMS 5/6 and CODE Stroke called,  suspicion for toxic and/or metabolic encephalopathy - MRI brain showed acute infarct left parietal lobe subcortical white matter. Pt is out of the window for tPA - neuro following   2. Syncope - no clear cardiac etiology  3. NSTEMI - hstrop up to 2300 - 10/2019 echo LVEF 40-45%, akineitc apex, grade I DDx, mod pleural effusion 10/2019 nuclear stress apical infarction with ischemia.  - cath not pursued given multiple medical issues with AMS, CVA, bacteremia, fever, CKD IV - no current symptoms  - medical therapy with ASA, atorva 80, coreg 6.25mg  bid. No ACE/ARB due to renal function.   4. Acute systolic HF - 10/6948 echo LVEF 40-45%, akineitc apex, grade I DDx, mod pleural effusion - diuretis per renal given renal  dysfunction - medical therapy with coreg 6.25mg  bid, hydral 100 tid, isordil 40mg  tid. No ACE/ARB/ARNI/aldactone given renal function. STop isordil while on NG drip   5. HTN - some of issues appear to be he was not able to take po meds for a period, now back on - on NG drip along with hydral 100 tid, coreg 6.25mg  bid - increase coreg to 12.5mg  bid. Wean NG drip - avoid aggressive correction given recent CVA. Wean NG drip as he gets back to taking his oral meds.   6. Fever/bacteremia - per primary team  7. Encephalopathy - per neuro   For questions or updates, please contact [place] Please consult [email or web address] for contact info under        Signed, Carlyle Dolly, MD  11/14/2019, 7:55 AM

## 2019-11-14 NOTE — Hospital Course (Addendum)
Syncope The patient was admitted here on 04/30 when he sat down to eat breakfast, and the next thing he knew his girlfriend was calling paramedics. He does not recall exactly what happened, but the event was witnessed by his girlfriend. He states that he did not feel lightheaded, dizzy, or nauseous before the episode.  The patient also states that his hydralazine regimen was increased the day before his presentation. Potential etiologies that were investigated were arrhythmia, CAD, PE, seizure, vagal syncope, recent medication increase. Therefore, cardiology was consulted on the same day. Cardiology was most concerned for arrhythmia given nonsustained ventricular tachycardia and PVCs on telemetry. Cardiac catheterization was recommended, however patient's CKD and AMS made it difficult, so the plan was to hold off until stable. Once stable, the patient underwent cardiac catheterization on 05/13. Results demonstrated mid LAD lesion as well as ostial LAD lesion. Given complicated nature of his lesion and lack of symptoms related to chest pain, he did not undergo PCI at that time. Plan was for continued medical therapy with the recommendation for VUS with possible atherectomy in the future. He will follow-up with Cardiology in the outpatient setting.   Acute Ischemic Stroke Cefepime induced encephalopathy Patient has a history of prior CVA. On 05/05, he became confused during AM rounds. Upon reevaluation in the early afternoon, patient had an unremarkable neurologic exam, and a stat head CT without contrast showed no abnormalities. On 05/06, cardiology evaluated the patient and called a code stroke. This was called off as the patient's physical exam was reassuring. On 05/07, we ordered an MRI which showed an ischemic infarct in the left parietal region. Neurology was consulted for stroke workup. No significant stenoses on carotid duplex US. He was shortly afterwards transitioned to Plavix regimen given his  history of episodes of hematuria on ASA therapy. In regards to his altered mental status, this seemed most likely due to recent cefepime use for treatment of his UTI. Neurology also feels that this contributed to his AMS rather than his new ischemic stroke. EEG showed moderate diffuse encephalopathy. Given that an adverse effect of cefepime is confusion, he was transitioned to ceftazidime on 05/06. Other management included discontinuing his LR and giving the patient bicarb through his peripheral line.  His mental status improved significantly over the weekend of May 8th-May 9th. He will follow-up with Neurology in the outpatient setting.  UTI BPH s/p TURP Over the weekend of May 1-May 2, the patient developed fevers, chills, and some diaphoresis. Urine cultures and 1.4 blood cultures were positive for Pseudomonas species sensitive to cefepime and ceftazidime. The exact etiology was unclear, though his indwelling catheter could have been a potential source of infection. He was placed on cefepime on 05/02, which was continued until the patient experienced an episode of AMS as described above. At that time, he was transitioned to ceftazidime, and this was completed on 05/08 (7 days total of antibiotics).  CKD Stage IV Patient's baseline Cr is 3.5. Cardiology recommended cardiac catheterization, but this was held initially given elevated creatinine levels (levels rose to max of 4.34 early on).

## 2019-11-15 LAB — CBC
HCT: 25 % — ABNORMAL LOW (ref 39.0–52.0)
Hemoglobin: 8.4 g/dL — ABNORMAL LOW (ref 13.0–17.0)
MCH: 29.5 pg (ref 26.0–34.0)
MCHC: 33.6 g/dL (ref 30.0–36.0)
MCV: 87.7 fL (ref 80.0–100.0)
Platelets: 230 10*3/uL (ref 150–400)
RBC: 2.85 MIL/uL — ABNORMAL LOW (ref 4.22–5.81)
RDW: 13.1 % (ref 11.5–15.5)
WBC: 7.9 10*3/uL (ref 4.0–10.5)
nRBC: 0 % (ref 0.0–0.2)

## 2019-11-15 LAB — RENAL FUNCTION PANEL
Albumin: 2.1 g/dL — ABNORMAL LOW (ref 3.5–5.0)
Anion gap: 9 (ref 5–15)
BUN: 38 mg/dL — ABNORMAL HIGH (ref 8–23)
CO2: 26 mmol/L (ref 22–32)
Calcium: 8.1 mg/dL — ABNORMAL LOW (ref 8.9–10.3)
Chloride: 106 mmol/L (ref 98–111)
Creatinine, Ser: 3.34 mg/dL — ABNORMAL HIGH (ref 0.61–1.24)
GFR calc Af Amer: 21 mL/min — ABNORMAL LOW (ref >60)
GFR calc non Af Amer: 18 mL/min — ABNORMAL LOW (ref >60)
Glucose, Bld: 123 mg/dL — ABNORMAL HIGH (ref 70–99)
Phosphorus: 2.8 mg/dL (ref 2.5–4.6)
Potassium: 3.1 mmol/L — ABNORMAL LOW (ref 3.5–5.1)
Sodium: 141 mmol/L (ref 135–145)

## 2019-11-15 LAB — URINE CULTURE

## 2019-11-15 MED ORDER — POTASSIUM CHLORIDE CRYS ER 20 MEQ PO TBCR: 40.0000 meq | EXTENDED_RELEASE_TABLET | Freq: Two times a day (BID) | ORAL | Status: DC

## 2019-11-15 MED ORDER — AMLODIPINE BESYLATE 5 MG PO TABS
5.0000 mg | ORAL_TABLET | Freq: Every day | ORAL | Status: DC
  Administered 2019-11-15: 5 mg via ORAL
  Filled 2019-11-15 (×2): qty 1

## 2019-11-15 MED ORDER — POTASSIUM CHLORIDE 20 MEQ/15ML (10%) PO SOLN
40.0000 meq | Freq: Two times a day (BID) | ORAL | Status: AC
  Administered 2019-11-15 (×2): 40 meq via ORAL
  Filled 2019-11-15 (×2): qty 30

## 2019-11-15 MED ORDER — POTASSIUM CHLORIDE CRYS ER 20 MEQ PO TBCR
40.0000 meq | EXTENDED_RELEASE_TABLET | Freq: Once | ORAL | Status: AC
  Administered 2019-11-15: 40 meq via ORAL
  Filled 2019-11-15: qty 2

## 2019-11-15 NOTE — Progress Notes (Addendum)
Subjective: Patient much more alert today. Able to tell me his name, able to answer the oritenation questions however incorrectly. Stated that it was 2001, unable to tell me where we were. He was answering questions appropriately and following all commands. Stated that his family will be coming in today.   Objective:  Vital signs in last 24 hours: Vitals:   11/14/19 1725 11/14/19 1739 11/14/19 2100 11/15/19 0509  BP: (!) 182/94  (!) 178/96 (!) 187/87  Pulse: 68 70 69 66  Resp:   (!) 22 19  Temp:  98.4 F (36.9 C) 98.8 F (37.1 C) (!) 97.4 F (36.3 C)  TempSrc:  Oral Axillary Axillary  SpO2: 95%  92% 96%  Weight:    65.1 kg  Height:       General: Middle aged male, no acute distress, lying in bed Cardiac: Regular rate and rhythm, no murmurs rubs or gallops Pulmonary: CTA BL, no wheezing, rhonchi, rales Abdomen: Soft, nontender, nondistended, normoactive bowel sounds Neuro: Awake, alert, oriented x1 (name, not to time or place), following all commands, able to identify pen and phone, cranial nerves II-XII grossly intact, 5/5 strength in upper and lower extremities, sensation to light touch in tact in upper and lower extremities  Assessment/Plan:  Active Problems:   History of stroke   Syncope and collapse   Syncope   Chronic combined systolic and diastolic heart failure (HCC)   Non-STEMI (non-ST elevated myocardial infarction) ([place])   Encephalopathies   Acute ischemic left ICA stroke (HCC)   Bacteremia   Hypertensive urgency   Gait disorder    Dennis Macias is a 69 y.o. male with a history of CAD chronic systolic and diastolic heart failure, CKD stage IV, anemia of chronic disease, and BPH with chronic indwelling Foley catheter status post TURP who presented with a syncopal episode that occurred on 11/06/19, now with acute onset confusion since 05/05, with recent MRI consistent with ischemic infarct of the left parietal region.  Active Problems:   History of stroke  Syncope and collapse   Syncope   Chronic combined systolic and diastolic heart failure (HCC)   Non-STEMI (non-ST elevated myocardial infarction) ([place])   Encephalopathies   Acute ischemic left ICA stroke ([place])   Bacteremia   Hypertensive urgency   Gait disorder  Ischemic Stroke Altered Mental Status -CT noncontrast of the head  05/05; no acute intracranial abnormality.  -On 05/06, the patient was evaluated by Cardiology, and a code stroke was called. This was called off as the patient's physical exam was reassuring. On 05/07, MRI showed an ischemic infarct in the left parietal region. No significant stenoses on carotid duplex US. Neurology is currently on board.  -Patient's medications were adjusted on 5/06, cefepime was transitioned to ceftazidime and his bicarb was switched to a bicarb drip.  -Today patient has significant improvement in his mental status, yesterday he was only saying "o" repeatedly and not following any commands.  Today patient is able to tell us his name, identify pen and phone, and follow all commands.  Given the improvement his mental status changes may have been due to metabolic encephalopathy secondary to his cefepime use.  -Neurology following, appreciate recommendation -Continue holding cefepime -Was holding off on DAPT for possible LP, consider restarting pending neurology evaluation -Continue ASA -PT/OT/SLP, PT and OT recommended CIR -Consider o/p 30 day event monitor to evaluate for paroxysmal atrial fibrillation   Syncope: Patient had an episode of syncope that occurred during breakfast on 04/30; no presyncopal  times or seizure activity noted. Reported increase in his home BP medications on 04/29 and he had just taken his medications prior to the episode. Labs were significant for troponin elevation of 1600 > 2300 > 2200. Differential includes arrhythmia and CAD; cardiology is on board and planning catheterization although his renal function makes this  challenging given risk of contrast nephropathy.   -Cardiology following, appreciate recommendations -Cardiology recommends a left heart cath eventually; mental status improving, neurology does not feel punctate stroke would be a contraindication for cardiac cath -Continue telemetry  UA showing pyuria; urine cultures positive for Pseudomonas 1/4 blood cultures positive for Pseudomonas  Completed 7-day course of antibiotics, initially getting cefepime and then completed ceftazidime. Repeat urine culture showed multiple species present, suggest recollection. Will hold off for now, patient has been afebrile with no leukocytosis.   CKD stage IV: Creatinine on admission was 3.84, near his baseline.  Has been around three-point 4-4 during admission.  Nephrology following.  Continuing sodium bicarb, transition from p.o. medications to drip, there is concern about p.o sodium bicarb continued to his altered mental status.  -Nephrology consulted; appreciate their recs -Sodium bicarbonate  Hypertension: Patient recently had his hydralazine and isosorbide medications increased at home before his syncopal episode. However, the patient remains hypertensive at this time. We will continue antihypertensive regimen with nitro,coreg 6.25mg  bid, hydral100 tid, isordil 40mg  tid for a goal SBP of 160s-180s.  -Continue BP regimen.  Chronic systolic and diastolic heart failure: BNP on admission elevated to>4000. Was on Lasix initially, but this was recently discontinued given climbing Scr levels.  Euvolemic on exam.   -Holding home Lasix -Continue beta blocker regimen -Strict I's/O's -Daily weights  Anemia of chronic disease: -Continue ferrous gluconate -Aranesp per nephrology  Prior to Admission Living Arrangement: Home Anticipated Discharge Location: TBD Barriers to Discharge: Pending clinical improvement Dispo: Anticipated discharge in approximately 3-4 day(s).   Dennis Noble,  MD 11/15/2019, 6:40 AM Pager: [phone]

## 2019-11-15 NOTE — Progress Notes (Addendum)
Inpatient Rehab Admissions Coordinator Note:   Per PT recommendation, pt was screened for CIR candidacy by Asuncion Tapscott Graves Madden, MS, CCC-SLP.  At this time we are recommending Inpatient Rehab consult.  AC will contact MD to request consult order.  Please contact me with questions.   Romanda Turrubiates Graves Madden, MS, CCC-SLP Admissions Coordinator [phone]     

## 2019-11-15 NOTE — Progress Notes (Signed)
   The patient is sitting up in his chair.  He is more awake and alert and speech is much improved.  He is able to name most things at the bedside with good comprehension but still with some fluency issues.  Incidental stroke:   L MCA/ACA border zone punctate subcortical infarct likely secondary to small vessel disease source .  However given recurrent stroke within the last several weeks and with dilated left atrium consider embolic as a possibility.  Consider outpatient 30-day cardiac event monitor to evaluate for paroxysmal atrial fibrillation.

## 2019-11-15 NOTE — Progress Notes (Addendum)
Progress Note  Patient Name: Dennis Macias Date of Encounter: 11/15/2019  Primary Cardiologist: Larae Grooms, MD   Subjective   No events overnight  Inpatient Medications    Scheduled Meds: . aspirin EC  81 mg Oral Daily  . atorvastatin  80 mg Oral QHS  . carvedilol  12.5 mg Oral BID WC  . darbepoetin (ARANESP) injection - NON-DIALYSIS  40 mcg Subcutaneous Q Tue-1800  . dorzolamide-timolol  1 drop Right Eye BID  . ferrous gluconate  324 mg Oral Q breakfast  . finasteride  5 mg Oral Daily  . heparin  5,000 Units Subcutaneous Q8H  . hydrALAZINE  100 mg Oral TID  . lidocaine  5 mL Intradermal Once  . mouth rinse  15 mL Mouth Rinse BID  . potassium chloride  40 mEq Oral BID  . sodium chloride flush  3 mL Intravenous Q12H  . tamsulosin  0.4 mg Oral QPC supper  . vitamin B-12  1,000 mcg Oral Daily   Continuous Infusions: . sodium chloride    . sodium chloride    . nitroGLYCERIN 30 mcg/min (11/15/19 0713)  .  sodium bicarbonate (isotonic) infusion in sterile water 75 mL/hr at 11/14/19 0438   PRN Meds: sodium chloride, acetaminophen **OR** acetaminophen, hydrALAZINE, LORazepam, sodium chloride flush   Vital Signs    Vitals:   11/14/19 1725 11/14/19 1739 11/14/19 2100 11/15/19 0509  BP: (!) 182/94  (!) 178/96 (!) 187/87  Pulse: 68 70 69 66  Resp:   (!) 22 19  Temp:  98.4 F (36.9 C) 98.8 F (37.1 C) (!) 97.4 F (36.3 C)  TempSrc:  Oral Axillary Axillary  SpO2: 95%  92% 96%  Weight:    65.1 kg  Height:        Intake/Output Summary (Last 24 hours) at 11/15/2019 0804 Last data filed at 11/15/2019 0700 Gross per 24 hour  Intake 1008 ml  Output 400 ml  Net 608 ml   Last 3 Weights 11/15/2019 11/14/2019 11/13/2019  Weight (lbs) 143 lb 9.6 oz 160 lb 0.9 oz 160 lb 7.9 oz  Weight (kg) 65.137 kg 72.6 kg 72.8 kg      Telemetry    SR - Personally Reviewed  ECG    n/a - Personally Reviewed  Physical Exam   GEN: No acute distress.   Neck: No JVD Cardiac: RRR, no  murmurs, rubs, or gallops.  Respiratory: Clear to auscultation bilaterally. GI: Soft, nontender, non-distended  MS: No edema; No deformity. Psych: cannot assess, altered mental status  Labs    High Sensitivity Troponin:   Recent Labs  Lab 11/06/19 0935 11/06/19 1924 11/06/19 2235  TROPONINIHS 1,618* 2,384* 2,233*      Chemistry Recent Labs  Lab 11/12/19 1039 11/12/19 1039 11/13/19 0419 11/14/19 0748 11/15/19 0336  NA 143   < > 144 142 141  K 4.0   < > 3.6 3.3* 3.1*  CL 115*   < > 111 106 106  CO2 16*   < > 18* 23 26  GLUCOSE 108*   < > 95 106* 123*  BUN 56*   < > 53* 46* 38*  CREATININE 3.94*   < > 3.65* 3.42* 3.34*  CALCIUM 8.5*   < > 8.5* 8.3* 8.1*  PROT 5.1*  --   --   --   --   ALBUMIN 1.9*   < > 2.0* 2.0* 2.1*  AST 12*  --   --   --   --   ALT  10  --   --   --   --   ALKPHOS 42  --   --   --   --   BILITOT 1.1  --   --   --   --   GFRNONAA 15*   < > 16* 17* 18*  GFRAA 17*   < > 19* 20* 21*  ANIONGAP 12   < > 15 13 9    < > = values in this interval not displayed.     Hematology Recent Labs  Lab 11/12/19 0354 11/14/19 0748 11/15/19 0336  WBC 4.9 7.0 7.9  RBC 2.82* 2.59* 2.85*  HGB 8.2* 7.6* 8.4*  HCT 25.9* 22.8* 25.0*  MCV 91.8 88.0 87.7  MCH 29.1 29.3 29.5  MCHC 31.7 33.3 33.6  RDW 13.0 13.2 13.1  PLT 218 217 230    BNPNo results for input(s): BNP, PROBNP in the last 168 hours.   DDimer No results for input(s): DDIMER in the last 168 hours.   Radiology    MR ANGIO HEAD WO CONTRAST  Result Date: 11/13/2019 CLINICAL DATA:  Stroke. EXAM: MRA HEAD WITHOUT CONTRAST TECHNIQUE: Angiographic images of the Circle of Willis were obtained using MRA technique without intravenous contrast. COMPARISON:  2017 FINDINGS: Motion artifact is present. Intracranial internal carotid arteries are patent with atherosclerotic irregularity. Middle and anterior cerebral arteries are patent. Left A1 ACA is dominant. Stable tiny outpouching near the expected location an  anterior communicating artery. This is unchanged. Included intracranial vertebral arteries, basilar artery, posterior cerebral arteries are patent. There is no significant stenosis identified. IMPRESSION: No proximal intracranial vessel occlusion or new stenosis. Unchanged tiny vascular protrusion from the expected region an anterior communicating artery. This may reflect a tiny aneurysm or could be contiguous with unseen diminutive right A1 ACA. Electronically Signed   By: Macy Mis M.D.   On: 11/13/2019 12:23   VAS US CAROTID  Result Date: 11/13/2019 Carotid Arterial Duplex Study Indications:       CVA and Syncope. Risk Factors:      Hypertension, coronary artery disease. Comparison Study:  carotid 09/13/2015 - 1-39% Performing Technologist: Velva Harman Sturdivant RDMS, RVT  Examination Guidelines: A complete evaluation includes B-mode imaging, spectral Doppler, color Doppler, and power Doppler as needed of all accessible portions of each vessel. Bilateral testing is considered an integral part of a complete examination. Limited examinations for reoccurring indications may be performed as noted.  Right Carotid Findings: +----------+--------+--------+--------+------------------+---------------------+           PSV cm/sEDV cm/sStenosisPlaque DescriptionComments              +----------+--------+--------+--------+------------------+---------------------+ CCA Prox  76      9                                                       +----------+--------+--------+--------+------------------+---------------------+ CCA Distal91      11                                intimal thickening    +----------+--------+--------+--------+------------------+---------------------+ ICA Prox  154     19      1-39%   calcific          elevated PSV with  significant stenosis                                                      noted                  +----------+--------+--------+--------+------------------+---------------------+ ICA Mid   95      19      1-39%                                           +----------+--------+--------+--------+------------------+---------------------+ ICA Distal40      13                                                      +----------+--------+--------+--------+------------------+---------------------+ ECA       199                                                             +----------+--------+--------+--------+------------------+---------------------+ +----------+--------+-------+----------------+-------------------+           PSV cm/sEDV cmsDescribe        Arm Pressure (mmHG) +----------+--------+-------+----------------+-------------------+ Subclavian130            Multiphasic, WNL                    +----------+--------+-------+----------------+-------------------+ +---------+--------+--+--------+--+---------+ VertebralPSV cm/s64EDV cm/s23Antegrade +---------+--------+--+--------+--+---------+  Left Carotid Findings: +----------+--------+--------+--------+------------------+---------------------+           PSV cm/sEDV cm/sStenosisPlaque DescriptionComments              +----------+--------+--------+--------+------------------+---------------------+ CCA Prox  75      13                                                      +----------+--------+--------+--------+------------------+---------------------+ CCA Distal178     31              hypoechoic        elevated PSV without                                                      significant stenosis  +----------+--------+--------+--------+------------------+---------------------+ ICA Prox  107     18      1-39%   hypoechoic                              +----------+--------+--------+--------+------------------+---------------------+ ICA Distal62      15                                                       +----------+--------+--------+--------+------------------+---------------------+  ECA       81                                                              +----------+--------+--------+--------+------------------+---------------------+ +----------+--------+--------+----------------+-------------------+           PSV cm/sEDV cm/sDescribe        Arm Pressure (mmHG) +----------+--------+--------+----------------+-------------------+ [id number]             Multiphasic, WNL                    +----------+--------+--------+----------------+-------------------+ +---------+--------+--+--------+--+---------+ VertebralPSV cm/s32EDV cm/s11Antegrade +---------+--------+--+--------+--+---------+   Summary: Right Carotid: Velocities in the right ICA are consistent with a 1-39% stenosis. Left Carotid: Velocities in the left ICA are consistent with a 1-39% stenosis. Vertebrals:  Bilateral vertebral arteries demonstrate antegrade flow. Subclavians: Normal flow hemodynamics were seen in bilateral subclavian              arteries. *See table(s) above for measurements and observations.     Preliminary     Cardiac Studies     Patient Profile     69 y.o.malewith past medical history of coronary artery disease, ischemic cardiomyopathy, hypertension, hyperlipidemia, chronic stage IV kidney disease, chronic indwelling Foley catheter, history of CVA for evaluation of syncope. Echocardiogram April 2021 showed ejection fraction 40 to 10%, grade 1 diastolic dysfunction, severe left atrial enlargement, mild mitral regurgitation. Nuclear study April 2021 showed ejection fraction 40%. There was apical infarction with peri-infarct ischemia. Patient has been treated medically due to renal insufficiency and lack of symptoms.  Assessment & Plan    1. CVA - - Patient with obvious AMS5/6and CODE Stroke called,suspicion for toxic and/or metabolic encephalopathy -MRI brain showed acute infarct  left parietal lobe subcortical white matter. Pt is out of the window for tPA - neuro following   2. Syncope - no clear cardiac etiology  3. NSTEMI - hstrop up to 2300 - 10/2019 echo LVEF 40-45%, akineitc apex, grade I DDx, mod pleural effusion 10/2019 nuclear stress apical infarction with ischemia.  - cath not pursued given multiple medical issues with AMS, CVA, bacteremia, fever, CKD IV  - medical therapy with ASA, atorva 80, coreg 6.25mg  bid. No ACE/ARB due to renal function.  - denies any symptoms - would treat medically at this time.   4. Acute systolic HF - 08/7251 echo LVEF 40-45%, akineitc apex, grade I DDx, mod pleural effusion - diuretis per renal given renal dysfunction - medical therapy with coreg 6.25mg  bid, hydral 100 tid No ACE/ARB/ARNI/aldactone given renal function. Has been on NG drip for bp's, would start back his isordil once weaned off   5. HTN - some of issues appear to be he was not able to take po meds for a period, now back on - on NG drip along with hydral 100 tid, coreg 12.5mg  bid. Add norvasc 5mg  daily.  - avoid aggressive correction given recent CVA.  6. Fever/bacteremia - per primary team  7. Encephalopathy - per neuro, from notes thought to be related to cefepime - remains significantly altered  For questions or updates, please contact [place] Please consult [email or web address] for contact info under        Signed, Carlyle Dolly, MD  11/15/2019, 8:04 AM

## 2019-11-15 NOTE — Progress Notes (Signed)
Occupational Therapy Re-Evaluation Patient Details Name: Dennis Macias MRN: [id number] DOB: Jan 18, 1951 Today's Date: 11/15/2019    History of Present Illness Pt is a 69 y.o. male with a PMHx of CAD, ischemic cardiomyopathy, chronic systolic and diastolic heart failure, CKD stage IV, anemia of chronic disease, and BPH with chronic indwelling foley catheter s/p TURP 10/20/19. Came to ED after syncopal episode found to be hypertensive and febrile. Admitted 11/06/19. Plan for cardiac cath however creatinine levels elevated. Cardiology recommends a left heart cath eventually; will hold off for now given acutely altered mental status. Of note, pt with new AMS and a code stroke was called on 5/6. MRI of the brain revealed ischemic infarct in the left parietal region.   Clinical Impression   Re-evaluation completed due to new CVA found on 11/12/19. Goals updated to reflect change. Pt now requiring increased assist to complete ADLs and transfers as well as increased cueing and time to process instructions due to aphasia. Pt able to sit EOB ~10 min with min guard to ensure balance and safety. Pt pivoted to [facility] and to bedside chair with RW and min assist x 2 for safety and balance. Pt required total assist to complete clothing management and peri care after BM due to inability to understand and follow instructions. Provided max multimodal cueing throughout with pt able to follow some instructions if demonstrated or gestured. Pt setup for breakfast while seated in bedside chair. Assist to open containers. Pt utilized right hand, able to hold and load utensils. Pt placed left hand underneath utensil in right as added precaution in case food dropped. Pt demonstrated good [place] and required increased time to complete. Occasional jerking of BUEs. Pt demonstrates decreased ROM, GMC, strength, endurance, balance, standing tolerance, activity tolerance, and safety awareness impacting ability to complete self-care and functional  transfer tasks. Recommend continued skilled OT services to address above deficits in order to promote function and prevent further decline. Recommend discharge to CIR for additional rehab prior to discharge home.      Follow Up Recommendations  CIR;Supervision/Assistance - 24 hour    Equipment Recommendations  Other (comment)(TBD at next level of care)    Recommendations for Other Services Rehab consult     Precautions / Restrictions Precautions Precautions: Fall Precaution Comments: global aphasia Restrictions Weight Bearing Restrictions: No      Mobility Bed Mobility Overal bed mobility: Needs Assistance Bed Mobility: Rolling;Sidelying to Sit Rolling: Min assist Sidelying to sit: Mod assist;+2 for physical assistance;HOB elevated       General bed mobility comments: Multimodal cues and increased time. Assist to bring feet off bed and push trunk up into sitting.   Transfers Overall transfer level: Needs assistance Equipment used: Rolling walker (2 wheeled) Transfers: Sit to/from [facility] Sit to Stand: Mod assist;Min assist;+2 physical assistance;+2 safety/equipment Stand pivot transfers: Min assist;+2 physical assistance;+2 safety/equipment       General transfer comment: Initially required mod assist x 2 to stand from bed, progressing to min assist for additional sit/stands. Multimodal cues and gestures to complete task. Increased time needed. Assist to manuever RW during transfers.     Balance Overall balance assessment: Needs assistance Sitting-balance support: Feet supported Sitting balance-Leahy Scale: Fair     Standing balance support: Bilateral upper extremity supported;During functional activity Standing balance-Leahy Scale: Poor Standing balance comment: requires UE support  ADL either performed or assessed with clinical judgement   ADL Overall ADL's : Needs assistance/impaired Eating/Feeding:  Sitting;Minimal assistance Eating/Feeding Details (indicate cue type and reason): While seated in bedside chair. Assist to open containers. Pt utilized right hand, able to hold and load utensils. Pt placed left hand underneath utensil in right as added precaution in case food is dropped. Pt required increased time to complete. Occasional jerking of BUEs.  Grooming: Minimal assistance;Sitting   Upper Body Bathing: Moderate assistance;Minimal assistance;Sitting   Lower Body Bathing: Maximal assistance;Sitting/lateral leans;Sit to/from stand   Upper Body Dressing : Moderate assistance;Sitting   Lower Body Dressing: Maximal assistance;Sit to/from stand;Sitting/lateral leans   Toilet Transfer: Minimal assistance;+2 for physical assistance;+2 for safety/equipment;BSC Toilet Transfer Details (indicate cue type and reason): Able to stand pivot with increased time and gestures to complete.  Toileting- Clothing Manipulation and Hygiene: Total assistance;Sit to/from stand Toileting - Clothing Manipulation Details (indicate cue type and reason): To manage clothing and complete peri care following BM.      Functional mobility during ADLs: Minimal assistance;+2 for physical assistance;+2 for safety/equipment;Rolling walker General ADL Comments: Pt tolerated sitting EOB ~10 min with min guard to ensure balance and safety. Pt able to transfer to/from [facility] and to bedside chair with RW and min assist x 2 for balance and safety. Assist to manuever RW.      Vision         Perception     Praxis      Pertinent Vitals/Pain Pain Assessment: Faces Faces Pain Scale: No hurt     Hand Dominance     Extremity/Trunk Assessment Upper Extremity Assessment Upper Extremity Assessment: Generalized weakness;RUE deficits/detail;LUE deficits/detail;Difficult to assess due to impaired cognition RUE Deficits / Details: RUE AROM shld flex to 90 degrees. Pt unable to follow additional instructions PROM elbow, wrist,  and digits WFL. Pt able to minimally squeeze therapists hands once demonstrated.  RUE Coordination: decreased gross motor(Occasional jerking movements of BUE.) LUE Deficits / Details: LUE AROM shld flex to 80 degrees. Pt unable to follow additional instructions PROM elbow, wrist, and digits WFL. Pt able to minimally squeeze therapists hands once demonstrated.  LUE Coordination: decreased gross motor(Occasional jerking movements of BUE.)   Lower Extremity Assessment Lower Extremity Assessment: Defer to PT evaluation       Communication     Cognition Arousal/Alertness: Awake/alert Behavior During Therapy: Flat affect Overall Cognitive Status: Difficult to assess Area of Impairment: Following commands                       Following Commands: Follows one step commands inconsistently;Follows one step commands with increased time       General Comments: Provided max multimodal cues for all tasks. Pt does better following commands with gestures or when task is demonstrated to him. Increased time needed to complete all tasks. Pt continually states "Oh!" throughout session.     General Comments       Exercises     Shoulder Instructions      Home Living                                          Prior Functioning/Environment                   OT Problem List:        OT Treatment/Interventions:  OT Goals(Current goals can be found in the care plan section) Acute Rehab OT Goals Patient Stated Goal: Pt unable to state Time For Goal Achievement: 11/29/19 Potential to Achieve Goals: Good ADL Goals Pt Will Perform Eating: sitting;with supervision Pt Will Perform Grooming: with supervision;sitting Pt Will Perform Lower Body Dressing: sit to/from stand;with min assist Pt Will Transfer to Toilet: with min assist;bedside commode Pt Will Perform Toileting - Clothing Manipulation and hygiene: with mod assist;sit to/from stand Pt/caregiver will  Perform Home Exercise Program: Increased ROM;Both right and left upper extremity;With Supervision  OT Frequency: Min 2X/week   Barriers to D/C:            Co-evaluation              AM-PAC OT "6 Clicks" Daily Activity     Outcome Measure Help from another person eating meals?: A Little Help from another person taking care of personal grooming?: A Little Help from another person toileting, which includes using toliet, bedpan, or urinal?: Total Help from another person bathing (including washing, rinsing, drying)?: A Lot Help from another person to put on and taking off regular upper body clothing?: A Lot Help from another person to put on and taking off regular lower body clothing?: A Lot 6 Click Score: 13   End of Session Equipment Utilized During Treatment: Gait belt;Rolling walker Nurse Communication: Mobility status  Activity Tolerance: Patient tolerated treatment well Patient left: in chair;with call bell/phone within reach;with chair alarm set;with nursing/sitter in room  OT Visit Diagnosis: Unsteadiness on feet (R26.81);Muscle weakness (generalized) (M62.81);Cognitive communication deficit (R41.841) Symptoms and signs involving cognitive functions: Cerebral infarction                Time: 7017-7939 OT Time Calculation (min): 42 min Charges:  OT General Charges $OT Visit: 1 Visit OT Evaluation $OT Re-eval: 1 Re-eval OT Treatments $Self Care/Home Management : 23-37 mins  Mauri Brooklyn OTR/L [phone]   Mauri Brooklyn 11/15/2019, 10:32 AM

## 2019-11-15 NOTE — Progress Notes (Signed)
Patient ID: Dennis Macias, male   DOB: 01/16/1951, 69 y.o.   MRN: [id number] S: No events overnight. O:BP (!) 166/81   Pulse 60   Temp (!) 97.4 F (36.3 C) (Axillary)   Resp 19   Ht 6' (1.829 m)   Wt 65.1 kg   SpO2 96%   BMI 19.48 kg/m   Intake/Output Summary (Last 24 hours) at 11/15/2019 1206 Last data filed at 11/15/2019 0700 Gross per 24 hour  Intake 1008 ml  Output 400 ml  Net 608 ml   Intake/Output: I/O last 3 completed shifts: In: 1008 [I.V.:1008] Out: 800 [Urine:800]  Intake/Output this shift:  No intake/output data recorded. Weight change: -7.463 kg Gen: NAD CVS: RRR, no rub Resp: cta Abd: +bs, soft, NT/nD Ext: no edema  Recent Labs  Lab 11/10/19 0349 11/10/19 0349 11/11/19 0508 11/11/19 1444 11/12/19 0354 11/12/19 1039 11/13/19 0419 11/14/19 0748 11/15/19 0336  NA 138   < > 140 140 141 143 144 142 141  K 3.4*   < > 4.1 4.0 4.0 4.0 3.6 3.3* 3.1*  CL 111   < > 111 113* 117* 115* 111 106 106  CO2 19*   < > 16* 16* 16* 16* 18* 23 26  GLUCOSE 118*   < > 93 108* 104* 108* 95 106* 123*  BUN 60*   < > 60* 58* 57* 56* 53* 46* 38*  CREATININE 4.18*   < > 4.03* 3.88* 3.90* 3.94* 3.65* 3.42* 3.34*  ALBUMIN 1.9*   < > 2.0* 2.0* 1.9* 1.9* 2.0* 2.0* 2.1*  CALCIUM 8.2*   < > 8.3* 8.0* 8.2* 8.5* 8.5* 8.3* 8.1*  PHOS 4.5  --  3.9 4.0 4.0  --  3.7 3.2 2.8  AST  --   --   --   --   --  12*  --   --   --   ALT  --   --   --   --   --  10  --   --   --    < > = values in this interval not displayed.   Liver Function Tests: Recent Labs  Lab 11/12/19 1039 11/12/19 1039 11/13/19 0419 11/14/19 0748 11/15/19 0336  AST 12*  --   --   --   --   ALT 10  --   --   --   --   ALKPHOS 42  --   --   --   --   BILITOT 1.1  --   --   --   --   PROT 5.1*  --   --   --   --   ALBUMIN 1.9*   < > 2.0* 2.0* 2.1*   < > = values in this interval not displayed.   No results for input(s): LIPASE, AMYLASE in the last 168 hours. Recent Labs  Lab 11/13/19 0419  AMMONIA 18    CBC: Recent Labs  Lab 11/10/19 0349 11/10/19 0349 11/11/19 0508 11/11/19 0508 11/12/19 0354 11/14/19 0748 11/15/19 0336  WBC 5.1   < > 5.1   < > 4.9 7.0 7.9  HGB 8.2*   < > 8.3*   < > 8.2* 7.6* 8.4*  HCT 25.1*   < > 25.2*   < > 25.9* 22.8* 25.0*  MCV 91.6  --  91.6  --  91.8 88.0 87.7  PLT 210   < > 210   < > 218 217 230   < > = values  in this interval not displayed.   Cardiac Enzymes: No results for input(s): CKTOTAL, CKMB, CKMBINDEX, TROPONINI in the last 168 hours. CBG: Recent Labs  Lab 11/11/19 1327 11/11/19 2110 11/12/19 0750 11/12/19 1655 11/14/19 0755  GLUCAP 92 99 97 97 101*    Iron Studies: No results for input(s): IRON, TIBC, TRANSFERRIN, FERRITIN in the last 72 hours. Studies/Results: VAS US CAROTID  Result Date: 11/13/2019 Carotid Arterial Duplex Study Indications:       CVA and Syncope. Risk Factors:      Hypertension, coronary artery disease. Comparison Study:  carotid 09/13/2015 - 1-39% Performing Technologist: Velva Harman Sturdivant RDMS, RVT  Examination Guidelines: A complete evaluation includes B-mode imaging, spectral Doppler, color Doppler, and power Doppler as needed of all accessible portions of each vessel. Bilateral testing is considered an integral part of a complete examination. Limited examinations for reoccurring indications may be performed as noted.  Right Carotid Findings: +----------+--------+--------+--------+------------------+---------------------+           PSV cm/sEDV cm/sStenosisPlaque DescriptionComments              +----------+--------+--------+--------+------------------+---------------------+ CCA Prox  76      9                                                       +----------+--------+--------+--------+------------------+---------------------+ CCA Distal91      11                                intimal thickening    +----------+--------+--------+--------+------------------+---------------------+ ICA Prox  154     19       1-39%   calcific          elevated PSV with                                                         significant stenosis                                                      noted                 +----------+--------+--------+--------+------------------+---------------------+ ICA Mid   95      19      1-39%                                           +----------+--------+--------+--------+------------------+---------------------+ ICA Distal40      13                                                      +----------+--------+--------+--------+------------------+---------------------+ ECA       199                                                             +----------+--------+--------+--------+------------------+---------------------+ +----------+--------+-------+----------------+-------------------+  PSV cm/sEDV cmsDescribe        Arm Pressure (mmHG) +----------+--------+-------+----------------+-------------------+ Subclavian130            Multiphasic, WNL                    +----------+--------+-------+----------------+-------------------+ +---------+--------+--+--------+--+---------+ VertebralPSV cm/s64EDV cm/s23Antegrade +---------+--------+--+--------+--+---------+  Left Carotid Findings: +----------+--------+--------+--------+------------------+---------------------+           PSV cm/sEDV cm/sStenosisPlaque DescriptionComments              +----------+--------+--------+--------+------------------+---------------------+ CCA Prox  75      13                                                      +----------+--------+--------+--------+------------------+---------------------+ CCA Distal178     31              hypoechoic        elevated PSV without                                                      significant stenosis  +----------+--------+--------+--------+------------------+---------------------+ ICA Prox  107     18       1-39%   hypoechoic                              +----------+--------+--------+--------+------------------+---------------------+ ICA Distal62      15                                                      +----------+--------+--------+--------+------------------+---------------------+ ECA       81                                                              +----------+--------+--------+--------+------------------+---------------------+ +----------+--------+--------+----------------+-------------------+           PSV cm/sEDV cm/sDescribe        Arm Pressure (mmHG) +----------+--------+--------+----------------+-------------------+ [id number]             Multiphasic, WNL                    +----------+--------+--------+----------------+-------------------+ +---------+--------+--+--------+--+---------+ VertebralPSV cm/s32EDV cm/s11Antegrade +---------+--------+--+--------+--+---------+   Summary: Right Carotid: Velocities in the right ICA are consistent with a 1-39% stenosis. Left Carotid: Velocities in the left ICA are consistent with a 1-39% stenosis. Vertebrals:  Bilateral vertebral arteries demonstrate antegrade flow. Subclavians: Normal flow hemodynamics were seen in bilateral subclavian              arteries. *See table(s) above for measurements and observations.     Preliminary    . amLODipine  5 mg Oral Daily  . aspirin EC  81 mg Oral Daily  . atorvastatin  80 mg Oral QHS  . carvedilol  12.5 mg Oral BID WC  . darbepoetin (ARANESP) injection - NON-DIALYSIS  40 mcg Subcutaneous Q Tue-1800  . dorzolamide-timolol  1 drop Right Eye BID  . ferrous gluconate  324 mg Oral Q breakfast  . finasteride  5 mg Oral Daily  . heparin  5,000 Units Subcutaneous Q8H  . hydrALAZINE  100 mg Oral TID  . lidocaine  5 mL Intradermal Once  . mouth rinse  15 mL Mouth Rinse BID  . potassium chloride  40 mEq Oral BID  . sodium chloride flush  3 mL Intravenous Q12H  . tamsulosin   0.4 mg Oral QPC supper  . vitamin B-12  1,000 mcg Oral Daily    BMET    Component Value Date/Time   NA 141 11/15/2019 0336   NA 140 08/13/2019 1139   K 3.1 (L) 11/15/2019 0336   CL 106 11/15/2019 0336   CO2 26 11/15/2019 0336   GLUCOSE 123 (H) 11/15/2019 0336   BUN 38 (H) 11/15/2019 0336   BUN 36 (H) 08/13/2019 1139   CREATININE 3.34 (H) 11/15/2019 0336   CALCIUM 8.1 (L) 11/15/2019 0336   GFRNONAA 18 (L) 11/15/2019 0336   GFRAA 21 (L) 11/15/2019 0336   CBC    Component Value Date/Time   WBC 7.9 11/15/2019 0336   RBC 2.85 (L) 11/15/2019 0336   HGB 8.4 (L) 11/15/2019 0336   HGB 7.7 (L) 08/13/2019 1139   HCT 25.0 (L) 11/15/2019 0336   HCT 22.3 (L) 08/13/2019 1139   PLT 230 11/15/2019 0336   PLT 392 08/13/2019 1139   MCV 87.7 11/15/2019 0336   MCV 86 08/13/2019 1139   MCH 29.5 11/15/2019 0336   MCHC 33.6 11/15/2019 0336   RDW 13.1 11/15/2019 0336   RDW 14.4 08/13/2019 1139   LYMPHSABS 1.1 11/06/2019 0935   LYMPHSABS 2.0 02/24/2018 1142   MONOABS 0.6 11/06/2019 0935   EOSABS 0.0 11/06/2019 0935   EOSABS 0.1 02/24/2018 1142   BASOSABS 0.0 11/06/2019 0935   BASOSABS 0.0 02/24/2018 1142    Assessment/Plan:  1. AMS- per neuro most likely due to cefepime related encephalopathy.  Changed to [facility] 11/12/19. 2. New Left parietal infarct seen on MRI - per Neuro, likely due to small vessel disease and not likely to have caused his AMS and speech issues.  Neuro following.  Holding off on LP for now. 3. AKI/CKD stage 4- following a syncopal episode.Baseline Scr appears to be 3.3-3.7 and is slowly improving with IVF's. Will decrease rate to 62ml/hr. Scr has improved back to his baseline Scr. 4. Syncope/NSTEMI- possible arrythmiaor could have been TIA. Cardiology consulted and wantedto proceed with cardiac cath, however given recent stroke that is on hold. 5. CKD stage 4- presumably due to DM and followed by Dr. Hollie Salk in our office and baseline Scr 3.3-3.7. 6. HTN- increased  hydralazine but BP remains elevated. Cont to followand lower slowly with recent ischemic stroke. 7. Fever- Tmax 100.5 last night. Afebrile today. Urine culture + for pseudomonas aeruginosa sensitive to cefepime and ceftaz, cipro. Started on cefepime and now on ceftaz. 8. Anemia of CKD-started aranesp 11/10/19, iron stores stable.  Donetta Potts, MD [employer] [phone]

## 2019-11-16 DIAGNOSIS — G9340 Encephalopathy, unspecified: Secondary | ICD-10-CM

## 2019-11-16 LAB — RENAL FUNCTION PANEL
Albumin: 2 g/dL — ABNORMAL LOW (ref 3.5–5.0)
Anion gap: 9 (ref 5–15)
BUN: 38 mg/dL — ABNORMAL HIGH (ref 8–23)
CO2: 28 mmol/L (ref 22–32)
Calcium: 8.5 mg/dL — ABNORMAL LOW (ref 8.9–10.3)
Chloride: 106 mmol/L (ref 98–111)
Creatinine, Ser: 3.52 mg/dL — ABNORMAL HIGH (ref 0.61–1.24)
GFR calc Af Amer: 19 mL/min — ABNORMAL LOW (ref >60)
GFR calc non Af Amer: 17 mL/min — ABNORMAL LOW (ref >60)
Glucose, Bld: 125 mg/dL — ABNORMAL HIGH (ref 70–99)
Phosphorus: 2.9 mg/dL (ref 2.5–4.6)
Potassium: 3.5 mmol/L (ref 3.5–5.1)
Sodium: 143 mmol/L (ref 135–145)

## 2019-11-16 LAB — CBC
HCT: 26.9 % — ABNORMAL LOW (ref 39.0–52.0)
Hemoglobin: 8.8 g/dL — ABNORMAL LOW (ref 13.0–17.0)
MCH: 29.1 pg (ref 26.0–34.0)
MCHC: 32.7 g/dL (ref 30.0–36.0)
MCV: 89.1 fL (ref 80.0–100.0)
Platelets: 259 10*3/uL (ref 150–400)
RBC: 3.02 MIL/uL — ABNORMAL LOW (ref 4.22–5.81)
RDW: 13.3 % (ref 11.5–15.5)
WBC: 6.1 10*3/uL (ref 4.0–10.5)
nRBC: 0 % (ref 0.0–0.2)

## 2019-11-16 LAB — VITAMIN B1: Vitamin B1 (Thiamine): 83.1 nmol/L (ref 66.5–200.0)

## 2019-11-16 MED ORDER — CLOPIDOGREL BISULFATE 75 MG PO TABS: 75.0000 mg | ORAL_TABLET | Freq: Every day | ORAL | Status: DC

## 2019-11-16 MED ORDER — AMLODIPINE BESYLATE 5 MG PO TABS
5.0000 mg | ORAL_TABLET | Freq: Every day | ORAL | Status: DC
  Administered 2019-11-17 – 2019-11-25 (×9): 5 mg via ORAL
  Filled 2019-11-16 (×9): qty 1

## 2019-11-16 MED ORDER — ISOSORBIDE MONONITRATE ER 60 MG PO TB24
60.0000 mg | ORAL_TABLET | Freq: Every day | ORAL | Status: DC
  Administered 2019-11-16 – 2019-11-25 (×10): 60 mg via ORAL
  Filled 2019-11-16 (×10): qty 1

## 2019-11-16 MED ORDER — AMLODIPINE BESYLATE 10 MG PO TABS
10.0000 mg | ORAL_TABLET | Freq: Every day | ORAL | Status: DC
  Administered 2019-11-16: 10 mg via ORAL
  Filled 2019-11-16: qty 1

## 2019-11-16 MED ORDER — CLOPIDOGREL BISULFATE 75 MG PO TABS
75.0000 mg | ORAL_TABLET | Freq: Every day | ORAL | Status: DC
  Administered 2019-11-16 – 2019-11-25 (×10): 75 mg via ORAL
  Filled 2019-11-16 (×10): qty 1

## 2019-11-16 NOTE — Progress Notes (Signed)
Inpatient Rehabilitation-Admissions Coordinator   Met with pt bedside as follow up form PM&R consult. Pt being transferred back to bed from recliner with two person nursing assist. [facility] discussed reason for visit and recommended rehab program. Pt unaware of current physical deficits at this time and appears pleasantly confused during bedside assessment. He does not see a need for IP Rehab at this time. He did give me permission to discuss the program with his significant other [name]) as she will need to be the one to assist him at home. Await call back from [facility] to determine caregiver support and preference for rehab.   Will continue to follow.   Please call if questions.   Raechel Ache, OTR/L  Rehab Admissions Coordinator  [phone] 11/16/2019 4:14 PM

## 2019-11-16 NOTE — Progress Notes (Signed)
STROKE TEAM PROGRESS NOTE   INTERVAL HISTORY His son is at the bedside.  He states patient is still confused and not speaking much.  He continues to have significant aphasia mostly expressive but can follow simple midline commands and gestures.  No neurological changes.  Vital signs stable. He has a history of hematuria in the past on aspirin. Vitals:   11/16/19 0819 11/16/19 0847 11/16/19 1044 11/16/19 1708  BP: (!) 177/81 (!) 176/81 (!) 159/73 (!) 156/82  Pulse: 67 64    Resp: 16   15  Temp: 98.1 F (36.7 C)   97.9 F (36.6 C)  TempSrc: Oral   Oral  SpO2: 99%   97%  Weight:      Height:        CBC:  Recent Labs  Lab 11/15/19 0336 11/16/19 0710  WBC 7.9 6.1  HGB 8.4* 8.8*  HCT 25.0* 26.9*  MCV 87.7 89.1  PLT 230 935    Basic Metabolic Panel:  Recent Labs  Lab 11/15/19 0336 11/16/19 0710  NA 141 143  K 3.1* 3.5  CL 106 106  CO2 26 28  GLUCOSE 123* 125*  BUN 38* 38*  CREATININE 3.34* 3.52*  CALCIUM 8.1* 8.5*  PHOS 2.8 2.9   Lipid Panel:     Component Value Date/Time   CHOL 108 11/14/2019 0748   CHOL 123 10/14/2019 1028   TRIG 95 11/14/2019 0748   HDL 33 (L) 11/14/2019 0748   HDL 65 10/14/2019 1028   CHOLHDL 3.3 11/14/2019 0748   VLDL 19 11/14/2019 0748   LDLCALC 56 11/14/2019 0748   LDLCALC 49 10/14/2019 1028   HgbA1c:  Lab Results  Component Value Date   HGBA1C 5.9 (H) 11/14/2019   Urine Drug Screen:     Component Value Date/Time   LABOPIA NONE DETECTED 07/31/2019 1145   COCAINSCRNUR NONE DETECTED 07/31/2019 1145   LABBENZ NONE DETECTED 07/31/2019 1145   AMPHETMU NONE DETECTED 07/31/2019 1145   THCU NONE DETECTED 07/31/2019 1145   LABBARB NONE DETECTED 07/31/2019 1145    Alcohol Level     Component Value Date/Time   ETH <10 07/31/2019 1019    IMAGING past 24 hours No results found.  PHYSICAL EXAM   Temp:  [97.9 F (36.6 C)-98.3 F (36.8 C)] 97.9 F (36.6 C) (05/10 1708) Pulse Rate:  [60-77] 64 (05/10 0847) Resp:  [14-17] 15  (05/10 1708) BP: (156-177)/(73-82) 156/82 (05/10 1708) SpO2:  [97 %-99 %] 97 % (05/10 1708) Weight:  [67 kg] 67 kg (05/10 0433)  General -middle-aged African-American male, in no apparent distress.  Ophthalmologic - fundi not visualized due to noncooperation.  Cardiovascular - Regular rhythm and rate.  Neuro - awake alert, severe expressive greater than receptive aphasia speaks only occasional guttural sounds.  Tracking bilaterally, no gaze palsy, not blinking to visual threat consistently but no obvious hemianopia.  Still not following commands, only make "O" sound for everything.  Right eye pupil large fixed with corneal clouding, left eye pupil reactive to light 3 mm.  EOMI.  Facial symmetrical.  Moving bilateral upper extremities 3/5, mild asterixis.  Bilateral lower extremity at least 2/5 with pain post ablation.  DTR 1+, no Babinski.  Sensation, coordination and gait not tested.    ASSESSMENT/PLAN Mr. Jaxxon Naeem is a 69 y.o. male with history of complicated ischemic cardiomyopathy, chronic diastolic heart failure, stage 4 kidney disease, DM, HTN and prior stroke who was admitted 4/30 for syncope, dx w/ NSTEMI and found to have pseudomonas  urosepsis 1/4 BCx. In hospital developed altered mental status (asphaic) w/ facial droop in setting of hypercarbia with extremity tremors. Toxic metabolic encephalopathy vs stroke.   Metabolic encephalopathy  Bilateral upper extremity asterixis  Nonfocal on exam but global aphasia  Ammonia, ABG, LFT within normal limits  Creatinine at baseline 4.34->3.8->3.65->3.42  Afebrile since 5/2  Most likely due to cefepime related encephalopathy, currently changed to [facility] since 5/6.  Given his CKD 4, cefepime related encephalopathy may be prolonged before resolving.  MRI left MCA/ACA punctate infarct not able to explain the encephalopathy  EEG no seizure, consistent with encephalopathy  No fever, leukocytosis, or nuchal rigidity to suggest CNS  infection.  Has been on antibiotics for 6 days, making CNS infection less likely.  Will hold off LP at this time.  Discussed with daughter and she agrees.  But will repeat CBC, PTT, PT/INR in a.m. in case LP needed.  I discussed with daughter and she is in agreement with LP if needed.  Supportive care  Incidental stroke:   L MCA/ACA border zone punctate subcortical infarct likely secondary to small vessel disease -stroke is too small to explain he is clinical presentation which is more compatible with encephalopathy..  However given recurrent stroke within the last several weeks and with dilated left atrium consider embolic as a possibility.  Consider outpatient 30-day cardiac event monitor to evaluate for paroxysmal atrial fibrillation.  CT head No acute abnormality. Small vessel disease. Atrophy.   MRI  L parietal punctate subcortical infarct. Small vessel disease. Atrophy. Chronic L thalamic lacune. Sinus dz.   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 No source of embolus. LA severely dilated. Moderate pleural effusion.   EEG continuous slowing, triphasic waves, generalized, maximal bifrontal.   LDL 56  HgbA1c 5.9  Heparin 5000 units sq tid for VTE prophylaxis  aspirin 81 mg daily prior to admission, now on Plavix mg daily. No DAPT due to history of hematuria recently on aspirin  Therapy recommendations: CLR disposition:  pending   Hx stroke/TIA  09/2015 - bilateral small infarcts in R and L frontal lobes,  infarcts in setting of uncontrolled BP and glucose as well as dehydration. Infarcts felts to be secondary to small vessel disease source. Put on aspirin 81 and statin.  LDL 148, A1c 10.7 at that time.  TEE no PFO, EF 60 to 65%.  Carotid Doppler negative.  MRA negative.  10/2019 - followed up with Dr. Leonie Man at [facility] for worsening gait, ordered MRI/MRA head and neck.  Bacteremia   Blood culture positive for  Pseudomonas 5/1  On cefepime changed to [place] not feel current stroke was related to bacteremia/endocarditis.  Treatment per primary team  Non-STEMI  Admitted for syncope  Found to have elevated troponin  Cardiology on board  Pending cardiac cath  Do not feel current punctate stroke would be contraindication for cardiac cath.  Hypertension  BP as high as 209/88 yesterday  Stable still on the higher end today . Gradually normalize BP in 2 to 3 days. . Long-term BP goal normotensive  Hyperlipidemia  Home meds:  lipitor 80, resumed in hospital  LDL 56, goal < 70  Continue statin at discharge  Other Stroke Risk Factors  Advanced age  Former Cigarette smoker, quit 41 yrs ago  Smokeless tobacco user - chew     Chronic systolic and diastolic Congestive heart failure. On lasix and  BB.   Other Active Problems  NSTEMI. Syncope. Cardiology recommends L heart cath in the future. On tele.   UTI + pseudomonas. Completed Tressie Ellis  CKD stage IV  Anemia of chronic dz. Hb - 8.2->7.6  Hypokalemia - 3.3  Code status - Full code   Hospital day # 10  Recommend Plavix 75 mg daily alone for stroke prevention given history of hematuria in the past on aspirin.  Continue aggressive risk factor modification.  Continue ongoing therapies.  Discussed with patient and son and medical resident and answered questions.  Greater than 50% time during this 25-minute visit was spent in counseling and coordination of care and answering questions.  Stroke team will sign off.  Kindly call for questions. Antony Contras, MD  To contact Stroke Continuity provider, please refer to [email or web address] After hours, contact General Neurology

## 2019-11-16 NOTE — Progress Notes (Signed)
Physical Therapy Treatment Patient Details Name: Emari Demmer MRN: [id number] DOB: 05/05/1951 Today's Date: 11/16/2019    History of Present Illness Pt is a 69 y.o. male with a PMHx of CAD, ischemic cardiomyopathy, chronic systolic and diastolic heart failure, CKD stage IV, anemia of chronic disease, and BPH with chronic indwelling foley catheter s/p TURP 10/20/19. Came to ED after syncopal episode found to be hypertensive and febrile. Admitted 11/06/19. Plan for cardiac cath however creatinine levels elevated. Cardiology recommends a left heart cath eventually; will hold off for now given acutely altered mental status. Of note, pt with new AMS and a code stroke was called on 5/6. MRI of the brain revealed ischemic infarct in the left parietal region.    PT Comments    Pt admitted with above diagnosis. Pt was able to ambulate with min assist and cues with RW progressing to mod assist as he fatigued due to bil LE knee instability and poor postural stability.  Will continue to progress pt as able.   Pt currently with functional limitations due to balance and endurance deficits. Pt will benefit from skilled PT to increase their independence and safety with mobility to allow discharge to the venue listed below.     Follow Up Recommendations  CIR;Supervision/Assistance - 24 hour     Equipment Recommendations  Other (comment)(defer to next venue of care)    Recommendations for Other Services Rehab consult     Precautions / Restrictions Precautions Precautions: Fall Precaution Comments: global aphasia Restrictions Weight Bearing Restrictions: No    Mobility  Bed Mobility Overal bed mobility: Needs Assistance Bed Mobility: Rolling;Sidelying to Sit Rolling: Min assist Sidelying to sit: [facility] elevated;Min assist       General bed mobility comments: Multimodal cues and increased time. Assist to bring feet off bed and push trunk up into sitting.   Transfers Overall transfer level: Needs  assistance Equipment used: Rolling walker (2 wheeled) Transfers: Sit to/from [facility] Sit to Stand: Min assist;+2 safety/equipment;From elevated surface         General transfer comment: Initially required min assist  to stand from bed. Multimodal cues and gestures to complete task. Increased time needed. Assist to manuever RW.  Ambulation/Gait Ambulation/Gait assistance: Min assist;Mod assist;+2 physical assistance Gait Distance (Feet): 35 Feet Assistive device: Rolling walker (2 wheeled) Gait Pattern/deviations: Step-through pattern;Decreased step length - right;Decreased step length - left;Shuffle;Trunk flexed Gait velocity: slowed Gait velocity interpretation: 1.31 - 2.62 ft/sec, indicative of limited community ambulator General Gait Details: slow, mildly unsteady gait to door and back.  Pt used RW for stability. Bil knee instability noted the more he ambulated needing mod assist to get back to bchair due to fatigue.    Stairs             Wheelchair Mobility    Modified Rankin (Stroke Patients Only) Modified Rankin (Stroke Patients Only) Pre-Morbid Rankin Score: Moderate disability Modified Rankin: Severe disability     Balance Overall balance assessment: Needs assistance Sitting-balance support: Feet supported Sitting balance-Leahy Scale: Fair     Standing balance support: Bilateral upper extremity supported;During functional activity Standing balance-Leahy Scale: Poor Standing balance comment: relies on UE support for balance as well as external support                            Cognition Arousal/Alertness: Awake/alert Behavior During Therapy: Flat affect Overall Cognitive Status: Difficult to assess Area of Impairment: Following commands  Following Commands: Follows one step commands inconsistently;Follows one step commands with increased time       General Comments: Provided max multimodal  cues for all tasks. Pt does better following commands with gestures or when task is demonstrated to him. Increased time needed to complete all tasks. Pt continually states "Oh!" throughout session.        Exercises General Exercises - Lower Extremity Ankle Circles/Pumps: AROM;Both;5 reps;Supine Quad Sets: Strengthening;Both;5 reps;Supine Gluteal Sets: Strengthening;Both;5 reps;Supine Long Arc Quad: AROM;Both;10 reps;Seated    General Comments General comments (skin integrity, edema, etc.): VSS      Pertinent Vitals/Pain Pain Assessment: No/denies pain    Home Living                      Prior Function            PT Goals (current goals can now be found in the care plan section) Acute Rehab PT Goals Patient Stated Goal: Pt unable to state Progress towards PT goals: Progressing toward goals    Frequency    Min 4X/week      PT Plan Current plan remains appropriate    Co-evaluation              AM-PAC PT "6 Clicks" Mobility   Outcome Measure  Help needed turning from your back to your side while in a flat bed without using bedrails?: A Lot Help needed moving from lying on your back to sitting on the side of a flat bed without using bedrails?: A Little Help needed moving to and from a bed to a chair (including a wheelchair)?: A Little Help needed standing up from a chair using your arms (e.g., wheelchair or bedside chair)?: A Little Help needed to walk in hospital room?: A Lot Help needed climbing 3-5 steps with a railing? : Total 6 Click Score: 14    End of Session Equipment Utilized During Treatment: Gait belt Activity Tolerance: Patient tolerated treatment well Patient left: with call bell/phone within reach;with family/visitor present;in chair;with chair alarm set Nurse Communication: Mobility status PT Visit Diagnosis: Other abnormalities of gait and mobility (R26.89)     Time: 6837-2902 PT Time Calculation (min) (ACUTE ONLY): 19  min  Charges:  $Gait Training: 8-22 mins                     Chattie Greeson W,PT Acute Rehabilitation Services Pager:  [phone]  Office:  [place] 11/16/2019, 4:23 PM

## 2019-11-16 NOTE — Progress Notes (Signed)
Patient ID: Dennis Macias, male   DOB: 02/03/1951, 69 y.o.   MRN: [id number] S: No events overnight. Is hypertensive-  Nonoliguric 2200 of UOP but crt worsened a little.  Seems confused, said that he wanted to watch a certain channel that isnt working-  Comes back to that again-  Difficult to determine if uremic but said did not eat breakfast    O:BP (!) 176/81   Pulse 64   Temp 98.1 F (36.7 C) (Oral)   Resp 16   Ht 6' (1.829 m)   Wt 67 kg   SpO2 99%   BMI 20.03 kg/m   Intake/Output Summary (Last 24 hours) at 11/16/2019 1037 Last data filed at 11/16/2019 0850 Gross per 24 hour  Intake 144.56 ml  Output 2250 ml  Net -2105.44 ml   Intake/Output: I/O last 3 completed shifts: In: 1149.6 [I.V.:1149.6] Out: 2650 [Urine:2650]  Intake/Output this shift:  Total I/O In: 3 [I.V.:3] Out: -  Weight change: 1.86 kg Gen: NAD-  Some confusion, difficult to keep on task  CVS: RRR, no rub Resp: cta Abd: +bs, soft, NT/nD Ext: no edema  Recent Labs  Lab 11/11/19 0508 11/11/19 0508 11/11/19 1444 11/12/19 0354 11/12/19 1039 11/13/19 0419 11/14/19 0748 11/15/19 0336 11/16/19 0710  NA 140   < > 140 141 143 144 142 141 143  K 4.1   < > 4.0 4.0 4.0 3.6 3.3* 3.1* 3.5  CL 111   < > 113* 117* 115* 111 106 106 106  CO2 16*   < > 16* 16* 16* 18* 23 26 28   GLUCOSE 93   < > 108* 104* 108* 95 106* 123* 125*  BUN 60*   < > 58* 57* 56* 53* 46* 38* 38*  CREATININE 4.03*   < > 3.88* 3.90* 3.94* 3.65* 3.42* 3.34* 3.52*  ALBUMIN 2.0*   < > 2.0* 1.9* 1.9* 2.0* 2.0* 2.1* 2.0*  CALCIUM 8.3*   < > 8.0* 8.2* 8.5* 8.5* 8.3* 8.1* 8.5*  PHOS 3.9  --  4.0 4.0  --  3.7 3.2 2.8 2.9  AST  --   --   --   --  12*  --   --   --   --   ALT  --   --   --   --  10  --   --   --   --    < > = values in this interval not displayed.   Liver Function Tests: Recent Labs  Lab 11/12/19 1039 11/13/19 0419 11/14/19 0748 11/15/19 0336 11/16/19 0710  AST 12*  --   --   --   --   ALT 10  --   --   --   --   ALKPHOS 42   --   --   --   --   BILITOT 1.1  --   --   --   --   PROT 5.1*  --   --   --   --   ALBUMIN 1.9*   < > 2.0* 2.1* 2.0*   < > = values in this interval not displayed.   No results for input(s): LIPASE, AMYLASE in the last 168 hours. Recent Labs  Lab 11/13/19 0419  AMMONIA 18   CBC: Recent Labs  Lab 11/11/19 0508 11/11/19 0508 11/12/19 0354 11/12/19 0354 11/14/19 0748 11/15/19 0336 11/16/19 0710  WBC 5.1   < > 4.9   < > 7.0 7.9 6.1  HGB 8.3*   < >  8.2*   < > 7.6* 8.4* 8.8*  HCT 25.2*   < > 25.9*   < > 22.8* 25.0* 26.9*  MCV 91.6  --  91.8  --  88.0 87.7 89.1  PLT 210   < > 218   < > 217 230 259   < > = values in this interval not displayed.   Cardiac Enzymes: No results for input(s): CKTOTAL, CKMB, CKMBINDEX, TROPONINI in the last 168 hours. CBG: Recent Labs  Lab 11/11/19 1327 11/11/19 2110 11/12/19 0750 11/12/19 1655 11/14/19 0755  GLUCAP 92 99 97 97 101*    Iron Studies: No results for input(s): IRON, TIBC, TRANSFERRIN, FERRITIN in the last 72 hours. Studies/Results: No results found. Marland Kitchen amLODipine  10 mg Oral Daily  . aspirin EC  81 mg Oral Daily  . atorvastatin  80 mg Oral QHS  . carvedilol  12.5 mg Oral BID WC  . darbepoetin (ARANESP) injection - NON-DIALYSIS  40 mcg Subcutaneous Q Tue-1800  . dorzolamide-timolol  1 drop Right Eye BID  . ferrous gluconate  324 mg Oral Q breakfast  . finasteride  5 mg Oral Daily  . heparin  5,000 Units Subcutaneous Q8H  . hydrALAZINE  100 mg Oral TID  . isosorbide mononitrate  60 mg Oral Daily  . lidocaine  5 mL Intradermal Once  . mouth rinse  15 mL Mouth Rinse BID  . sodium chloride flush  3 mL Intravenous Q12H  . tamsulosin  0.4 mg Oral QPC supper  . vitamin B-12  1,000 mcg Oral Daily    BMET    Component Value Date/Time   NA 143 11/16/2019 0710   NA 140 08/13/2019 1139   K 3.5 11/16/2019 0710   CL 106 11/16/2019 0710   CO2 28 11/16/2019 0710   GLUCOSE 125 (H) 11/16/2019 0710   BUN 38 (H) 11/16/2019 0710    BUN 36 (H) 08/13/2019 1139   CREATININE 3.52 (H) 11/16/2019 0710   CALCIUM 8.5 (L) 11/16/2019 0710   GFRNONAA 17 (L) 11/16/2019 0710   GFRAA 19 (L) 11/16/2019 0710   CBC    Component Value Date/Time   WBC 6.1 11/16/2019 0710   RBC 3.02 (L) 11/16/2019 0710   HGB 8.8 (L) 11/16/2019 0710   HGB 7.7 (L) 08/13/2019 1139   HCT 26.9 (L) 11/16/2019 0710   HCT 22.3 (L) 08/13/2019 1139   PLT 259 11/16/2019 0710   PLT 392 08/13/2019 1139   MCV 89.1 11/16/2019 0710   MCV 86 08/13/2019 1139   MCH 29.1 11/16/2019 0710   MCHC 32.7 11/16/2019 0710   RDW 13.3 11/16/2019 0710   RDW 14.4 08/13/2019 1139   LYMPHSABS 1.1 11/06/2019 0935   LYMPHSABS 2.0 02/24/2018 1142   MONOABS 0.6 11/06/2019 0935   EOSABS 0.0 11/06/2019 0935   EOSABS 0.1 02/24/2018 1142   BASOSABS 0.0 11/06/2019 0935   BASOSABS 0.0 02/24/2018 1142    Assessment/Plan:  1. AMS- per neuro most likely due to cefepime related encephalopathy.  Changed to [facility] 11/12/19. 2. New Left parietal infarct seen on MRI - per Neuro, likely due to small vessel disease and not likely to have caused his AMS and speech issues.  Neuro following.  Holding off on LP for now. 3. AKI/CKD stage 4- following a syncopal episode.Baseline Scr appears to be 3.3-3.7 and was slowly improving with IVF's.  Scr has improved back to his baseline Scr.This is not good but unclear if pt uremic.  No absolute needs for HD  4.  Syncope/NSTEMI- possible arrythmiaor could have been TIA. Cardiology consulted and wantedto proceed with cardiac cath, however given recent stroke that is on hold. 5. CKD stage 4- presumably due to DM and followed by Dr. Hollie Salk in our office and baseline Scr 3.3-3.7. 6. HTN- increased hydralazine but BP remains elevated. On norvasc 10/coreg 12.5 BID- pulse 60's/hydralazine 100 TID Cont to followand lower slowly with recent ischemic stroke. 7. Fever-  Afebrile today. Urine culture + for pseudomonas aeruginosa sensitive to cefepime and ceftaz,  cipro. Started on cefepime and now on ceftaz. 8. Anemia of CKD-started aranesp 11/10/19 at 40 mg-  May need increase , iron stores stable.  Louis Meckel  [employer] [phone]

## 2019-11-16 NOTE — Progress Notes (Signed)
Subjective: The patient was seen at bedside this AM. His mental status has significantly improved over the past couple of days. Today, he is AAOx3, was able to recognize certain objects such as a pen and a ring, and was able to follow commands with only slight difficulty. He is still somewhat confused compared to his baseline; for example, he commented on how he was unable to change the TV channel with his phone and pointed to the whiteboard when asked where the TV was. He otherwise has no other complaints today, such as CP, abdominal pain, or SOB.  Objective:  Vital signs in last 24 hours: Vitals:   11/16/19 0033 11/16/19 0433 11/16/19 0819 11/16/19 0847  BP: (!) 165/78 (!) 159/78 (!) 177/81 (!) 176/81  Pulse: 68 77 67 64  Resp: 17 16 16    Temp: 52.7 F (36.8 C) 98.3 F (36.8 C) 98.1 F (36.7 C)   TempSrc: Oral Oral Oral   SpO2: 99% 99% 99%   Weight:  67 kg    Height:       General: Middle aged male, no acute distress, lying in bed Cardiac: Regular rate and rhythm, no murmurs rubs or gallops Pulmonary: CTAB, no wheezing, rhonchi, rales Abdomen: Soft, nontender, nondistended, normoactive bowel sounds Neuro: AAOx3, following all commands, able to identify pen and ring, cranial nerves II-XII grossly intact; visual field testing was intact overall but testing was somewhat limited due to some difficulty following commands   Assessment/Plan: Dennis Macias is a 69 y.o. male with a history of CAD, chronic systolic and diastolic heart failure, CKD stage IV, anemia of chronic disease, and BPH with chronic indwelling Foley catheter status post TURP who presented with a syncopal episode that occurred on 11/06/19, now with acute onset confusion since 05/05, with recent MRI consistent with ischemic infarct of the left parietal region.   Active Problems:   History of stroke   Syncope and collapse   Syncope   Chronic combined systolic and diastolic heart failure (HCC)   Non-STEMI (non-ST  elevated myocardial infarction) ([place])   Encephalopathies   Acute ischemic left ICA stroke (HCC)   Bacteremia   Hypertensive urgency   Gait disorder   Ischemic Stroke Altered Mental Status CT noncontrast of the head performed on 05/05 showed no acute intracranial abnormality. On 05/06, the patient was evaluated by Cardiology, and a code stroke was called. This was called off as the patient's physical exam was reassuring. On 05/07, MRI showed an ischemic infarct in the left parietal region. No significant stenoses on carotid duplex US. Neurology is currently on board.  Patient's medications were adjusted on 5/06, cefepime was transitioned to ceftazidime and his bicarb was switched to a bicarb drip.  The patient has had significant improvement in his mental status since Saturday. He is AAOx3, is able to name objects such as a pen and ring, and follows commands with only slight difficulty. Given the improvement his mental status changes, this may have been due to metabolic encephalopathy secondary to his cefepime use.  -Neurology following, appreciate recommendations -Continue holding cefepime - Will switch ASA regimen to Plavix alone per Neurology recommendations (patient has a h/o hematuria on ASA regimen). -PT/OT/SLP, PT and OT recommend CIR -Consider outpatient 30 day event monitor to evaluate for paroxysmal atrial fibrillation (echo from 1 month prior showing left atrial enlargement)    Syncope: Patient had an episode of syncope that occurred during breakfast on 04/30; no presyncopal times or seizure activity noted. Reported increase in his  home BP medications on 04/29 and he had just taken his medications prior to the episode. Labs were significant for troponin elevation of 1600 > 2300 > 2200. Differential includes arrhythmia and CAD; cardiology is on board and planning catheterization although his renal function makes this challenging given risk of contrast nephropathy.   -Cardiology  following, appreciate recommendations -Cardiology recommends a left heart cath eventually; mental status improving, neurology does not feel punctate stroke would be a contraindication for cardiac cath -Continue telemetry   UA showing pyuria; urine cultures positive for Pseudomonas: 1/4 blood cultures positive for Pseudomonas  Completed 7-day course of antibiotics, initially getting cefepime and then completed ceftazidime. Repeat urine culture showed multiple species present, suggest possible recollection. Will hold off for now, patient continues to be afebrile with no leukocytosis.   - Consider repeat urine culture if the patient develops a fever or leukocytosis   CKD stage IV: Patient has a baseline Cr ~3.5. His creatinine today is 3.52, up from 3.34; likely mild creatinine bump in setting of recent volume losses (his weight is 67 kg today, versus weight ranging in the 70s-80s prior). His creatinine levels were around 3.4-4 early in his admission. Nephrology is currently following.   - Holding sodium bicarb due to concern of this contributing to his altered mental status - Nephrology consulted; appreciate their recs   Hypertension: Patient recently had his hydralazine and isosorbide medications increased at home before his syncopal episode. The patient remains hypertensive with most recent BP of 159/78 today. BP has recently been within our goal range of 160s-180s; permissive HTN in the setting of recent ischemic stroke. We will continue antihypertensive regimen with imdur, 60 mg PO qd, coreg 6.25mg  bid, hydral100 tid, amlodipine 10 mg po qd, isordil 40mg  tid for a goal SBP of 160s-180s.  -Continue BP regimen as above.   Chronic systolic and diastolic heart failure: BNP on admission elevated to>4000. Was on Lasix initially, but this was recently discontinued given recently climbing Scr levels. Euvolemic on exam. Creatinine today ~3.5; as described in detail above.  -Holding home  Lasix -Continue beta blocker regimen -Strict I's/O's -Daily weights   Anemia of chronic disease: -Continue ferrous gluconate -Aranesp per nephrology   Prior to Admission Living Arrangement: Home Anticipated Discharge Location: TBD Barriers to Discharge: Pending clinical improvement Dispo: Anticipated discharge in approximately 3-4 day(s).   Orvis Brill, Medical Student 11/16/2019, 9:37 AM Pager: [phone]

## 2019-11-16 NOTE — Progress Notes (Deleted)
Cardiology Office Note   Date:  11/16/2019   ID:  Dennis Macias, DOB 10/17/1950, MRN [id number]  PCP:  Dennis Hansen, MD    No chief complaint on file.  Preoperative evaluation  Wt Readings from Last 3 Encounters:  11/16/19 147 lb 11.2 oz (67 kg)  11/05/19 161 lb 8 oz (73.3 kg)  10/22/19 160 lb 0.9 oz (72.6 kg)       History of Present Illness: Dennis Macias is a 69 y.o. male with a history of diabetes, hyperlipidemia, hypertension and stroke.  I saw him in 2019 due to several episodes of syncope.  Of note, his sister had an AICD placed.  She has since passed away.  Ejection fraction was normal in 2017.  In 2019, it was thought that his syncope was due to dehydration.  Blood pressure medication was decreased.  Echocardiogram in April 2021 showed: "Left ventricular ejection fraction, by estimation, is 40 to 45%. The  left ventricle has mildly decreased function. The left ventricle  demonstrates regional wall motion abnormalities (see scoring  diagram/findings for description). There is mild  asymmetric left ventricular hypertrophy of the septal segment. Left  ventricular diastolic parameters are consistent with Grade I diastolic  dysfunction (impaired relaxation).  2. Right ventricular systolic function is normal. The right ventricular  size is normal. There is mildly elevated pulmonary artery systolic  pressure.  3. Left atrial size was severely dilated.  4. Moderate pleural effusion.  5. The mitral valve is normal in structure. Mild mitral valve  regurgitation. No evidence of mitral stenosis.  6. The aortic valve is tricuspid. Aortic valve regurgitation is not  visualized. Mild to moderate aortic valve sclerosis/calcification is  present, without any evidence of aortic stenosis.  7. The inferior vena cava is normal in size with greater than 50%  respiratory variability, suggesting right atrial pressure of 3 mmHg.   Comparison(s): Changes from prior study are  noted. The left ventricular  function is worsened. "  Over the past few months in 2021, he has had a TURP, diagnosed with nephrotic syndrome and had anasarca, pleural effusions and was also diagnosed with new onset systolic heart failure.  He was hospitalized in May 2021 with fever, encephalopathy, syncope.  He now needs a cervical fusion.    Past Medical History:  Diagnosis Date  . Anemia   . Arthritis    past hx   . Blindness    right eye  . Cataract    removed both eyes  . Dehydration   . Diabetes ([place])   . Glaucoma   . History of CVA (cerebrovascular accident) 09/13/2015  . History of urinary retention   . Hyperlipidemia   . Hypertension   . Pernicious anemia 02/24/2018  . Stroke [facility])    2017- March  . Syncope 11/2019  . Tachycardia 08/26/2017  . Tubular adenoma of colon 02/2017  . Weight loss, non-intentional 08/26/2017   10 lbs between 6/18 & 2/19    Past Surgical History:  Procedure Laterality Date  . CATARACT EXTRACTION, BILATERAL    . COLONOSCOPY    . IR THORACENTESIS ASP PLEURAL SPACE W/IMG GUIDE  09/21/2019  . IR THORACENTESIS ASP PLEURAL SPACE W/IMG GUIDE  10/16/2019  . POLYPECTOMY    . REFRACTIVE SURGERY  10/2017  . TEE WITHOUT CARDIOVERSION N/A 09/14/2015   Procedure: TRANSESOPHAGEAL ECHOCARDIOGRAM (TEE);  Surgeon: Larey Dresser, MD;  Location: [facility];  Service: Cardiovascular;  Laterality: N/A;  . TRANSURETHRAL RESECTION OF PROSTATE N/A 10/20/2019  Procedure: TRANSURETHRAL RESECTION OF THE PROSTATE (TURP);  Surgeon: Irine Seal, MD;  Location: [facility];  Service: Urology;  Laterality: N/A;  . UPPER GASTROINTESTINAL ENDOSCOPY       No current facility-administered medications for this visit.   No current outpatient medications on file.   Facility-Administered Medications Ordered in Other Visits  Medication Dose Route Frequency Provider Last Rate Last Admin  . 0.9 %  sodium chloride infusion  250 mL Intravenous PRN Furth, Cadence H, PA-C      .  0.9 %  sodium chloride infusion   Intravenous Continuous Furth, Cadence H, PA-C      . acetaminophen (TYLENOL) tablet 650 mg  650 mg Oral Q6H PRN [name], MD   650 mg at 11/09/19 2238   Or  . acetaminophen (TYLENOL) suppository 650 mg  650 mg Rectal Q6H PRN [name], MD      . Derrill Memo ON 11/17/2019] amLODipine (NORVASC) tablet 5 mg  5 mg Oral Daily Gilles Chiquito B, MD      . atorvastatin (LIPITOR) tablet 80 mg  80 mg Oral QHS [name], MD   80 mg at 11/15/19 2132  . carvedilol (COREG) tablet 12.5 mg  12.5 mg Oral BID WC Arnoldo Lenis, MD   12.5 mg at 11/16/19 1708  . clopidogrel (PLAVIX) tablet 75 mg  75 mg Oral Daily [name], Rylee, MD   75 mg at 11/16/19 1450  . Darbepoetin Alfa (ARANESP) injection 40 mcg  40 mcg Subcutaneous Q Tue-1800 Donato Heinz, MD   40 mcg at 11/10/19 2140  . dorzolamide-timolol (COSOPT) 22.3-6.8 MG/ML ophthalmic solution 1 drop  1 drop Right Eye BID [name], MD   1 drop at 11/16/19 0849  . ferrous gluconate (FERGON) tablet 324 mg  324 mg Oral Q breakfast [name], MD   324 mg at 11/16/19 0847  . finasteride (PROSCAR) tablet 5 mg  5 mg Oral Daily Lonia Skinner M, MD   5 mg at 11/16/19 0847  . heparin injection 5,000 Units  5,000 Units Subcutaneous Q8H [name], MD   5,000 Units at 11/16/19 1708  . hydrALAZINE (APRESOLINE) injection 10 mg  10 mg Intravenous Q8H PRN Maudie Mercury, MD      . hydrALAZINE (APRESOLINE) tablet 100 mg  100 mg Oral TID Lelon Perla, MD   100 mg at 11/16/19 1708  . isosorbide mononitrate (IMDUR) 24 hr tablet 60 mg  60 mg Oral Daily Lelon Perla, MD   60 mg at 11/16/19 1044  . lidocaine (XYLOCAINE) 1 % (with pres) injection 5 mL  5 mL Intradermal Once Rosalin Hawking, MD      . MEDLINE mouth rinse  15 mL Mouth Rinse BID Gilles Chiquito B, MD   15 mL at 11/16/19 0849  . sodium chloride flush (NS) 0.9 % injection 3 mL  3 mL Intravenous Q12H Furth, Cadence H, PA-C   3 mL at  11/16/19 0850  . sodium chloride flush (NS) 0.9 % injection 3 mL  3 mL Intravenous PRN Furth, Cadence H, PA-C      . tamsulosin (FLOMAX) capsule 0.4 mg  0.4 mg Oral QPC supper [name], MD   0.4 mg at 11/16/19 1707  . vitamin B-12 (CYANOCOBALAMIN) tablet 1,000 mcg  1,000 mcg Oral Daily [name], MD   1,000 mcg at 11/16/19 0848    Allergies:   Patient has no known allergies.    Social History:  The patient  reports that he has quit smoking. He quit smokeless tobacco use about 41 years ago.  His smokeless tobacco use included chew. He reports that he does not drink alcohol or use drugs.   Family History:  The patient's ***family history includes Hyperlipidemia in his father and mother; Hypertension in his mother.    ROS:  Please see the history of present illness.   Otherwise, review of systems are positive for ***.   All other systems are reviewed and negative.    PHYSICAL EXAM: VS:  There were no vitals taken for this visit. , BMI There is no height or weight on file to calculate BMI. GEN: Well nourished, well developed, in no acute distress  HEENT: normal  Neck: no JVD, carotid bruits, or masses Cardiac: ***RRR; no murmurs, rubs, or gallops,no edema  Respiratory:  clear to auscultation bilaterally, normal work of breathing GI: soft, nontender, nondistended, + BS MS: no deformity or atrophy  Skin: warm and dry, no rash Neuro:  Strength and sensation are intact Psych: euthymic mood, full affect   EKG:   The ekg ordered today demonstrates ***   Recent Labs: 11/06/2019: B Natriuretic Peptide 4,075.9; Magnesium 1.9 11/12/2019: ALT 10 11/13/2019: TSH 4.534 11/16/2019: BUN 38; Creatinine, Ser 3.52; Hemoglobin 8.8; Platelets 259; Potassium 3.5; Sodium 143   Lipid Panel    Component Value Date/Time   CHOL 108 11/14/2019 0748   CHOL 123 10/14/2019 1028   TRIG 95 11/14/2019 0748   HDL 33 (L) 11/14/2019 0748   HDL 65 10/14/2019 1028   CHOLHDL 3.3 11/14/2019 0748    VLDL 19 11/14/2019 0748   LDLCALC 56 11/14/2019 0748   LDLCALC 49 10/14/2019 1028     Other studies Reviewed: Additional studies/ records that were reviewed today with results demonstrating: ***.   ASSESSMENT AND PLAN:  1. ***  2. *** 3. ***   Current medicines are reviewed at length with the patient today.  The patient concerns regarding his medicines were addressed.  The following changes have been made:  No change***  Labs/ tests ordered today include: *** No orders of the defined types were placed in this encounter.   Recommend 150 minutes/week of aerobic exercise Low fat, low carb, high fiber diet recommended  Disposition:   FU in ***   Signed, Larae Grooms, MD  11/16/2019 10:10 PM    [facility] [place], [place], [place]  [address] Phone: [phone]; Fax: [phone]

## 2019-11-16 NOTE — Consult Note (Signed)
Physical Medicine and Rehabilitation Consult   Reason for Consult: Functional decline due to stroke.  Referring Physician: Dr. Daryll Drown   HPI: Dennis Macias is a 69 y.o. male with history of HTN, T2DM, BPH- s/p TURP/foley, prior CVA, recent admission for anasarca s/p thoracocentesis and IV diuresis; who was admitted on 11/06/19 after syncopal episode. He was found to have elevated troponin's due to NSTEMI as well as  frequent PVC's with NSVT. UDS negative. He developed fevers and chills past admission. He was started on cefepime for bacteremia due to pseudomonas UTI.  He developed progressive lethargy with confusion on 05/05 with BUE tremors felt to be due to toxic metabolic encephalopathy. On 05/06, he was found to have global aphasia and MRI/MRA brain done revealing acute 10 mm left parietal lobe subcortical infarct, chronic left thalamic infarct, advanced chronic small vessel disease and no intracranial stenosis.  EEG was suggestive of moderate diffuse nonspecific encephalopathy. Cefepime changed to [place] due to concerns of cefepime related encephalopathy.   Stroke felt to be incidental findings due to small vessel disease. Cardiology expressed concerns that ventricular arrhthymias in setting of CM could be a cause of syncope but plans for cath placed on hold with recommendations of medical treatment for NSTEMI and cardiac cath if patient stable and continues to improve.  Acute on chronic renal failure improving with IVF for hydration. He has defervesced with improvement in mentation, requires multimodal cues to follow commands, occasional BUE tremors noted with decrease in coordination, has limited verbal output and continues to have limitations in mobility and ADLs. CIR recommended due to functional decline.   No chest pains has nitroglycerin drip infusing  Review of Systems  Constitutional: Negative for chills and fever.  HENT: Negative for hearing loss and tinnitus.   Eyes:       Blind  in left eye.   Respiratory: Negative for cough and shortness of breath.   Cardiovascular: Negative for chest pain and palpitations.  Gastrointestinal: Negative for constipation, heartburn and nausea.  Musculoskeletal: Negative for back pain, myalgias and neck pain.  Neurological: Positive for weakness. Negative for dizziness and headaches.  Psychiatric/Behavioral: Positive for memory loss.    Past Medical History:  Diagnosis Date  . Anemia   . Arthritis    past hx   . Blindness    right eye  . Cataract    removed both eyes  . Dehydration   . Diabetes ([place])   . Glaucoma   . History of CVA (cerebrovascular accident) 09/13/2015  . History of urinary retention   . Hyperlipidemia   . Hypertension   . Pernicious anemia 02/24/2018  . Stroke [facility])    2017- March  . Syncope 11/2019  . Tachycardia 08/26/2017  . Tubular adenoma of colon 02/2017  . Weight loss, non-intentional 08/26/2017   10 lbs between 6/18 & 2/19   Past Surgical History:  Procedure Laterality Date  . CATARACT EXTRACTION, BILATERAL    . COLONOSCOPY    . IR THORACENTESIS ASP PLEURAL SPACE W/IMG GUIDE  09/21/2019  . IR THORACENTESIS ASP PLEURAL SPACE W/IMG GUIDE  10/16/2019  . POLYPECTOMY    . REFRACTIVE SURGERY  10/2017  . TEE WITHOUT CARDIOVERSION N/A 09/14/2015   Procedure: TRANSESOPHAGEAL ECHOCARDIOGRAM (TEE);  Surgeon: Larey Dresser, MD;  Location: [facility];  Service: Cardiovascular;  Laterality: N/A;  . TRANSURETHRAL RESECTION OF PROSTATE N/A 10/20/2019   Procedure: TRANSURETHRAL RESECTION OF THE PROSTATE (TURP);  Surgeon: Irine Seal, MD;  [facility];  Service: Urology;  Laterality: N/A;  . UPPER GASTROINTESTINAL ENDOSCOPY      Family History  Problem Relation Age of Onset  . Hypertension Mother   . Hyperlipidemia Mother   . Hyperlipidemia Father   . Colon cancer Neg Hx   . Colon polyps Neg Hx   . Esophageal cancer Neg Hx   . Rectal cancer Neg Hx   . Stomach cancer Neg Hx     Social History:   Lives with girlfriend. 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  He reports that he has quit smoking. He quit smokeless tobacco use about 41 years ago.  His smokeless tobacco use included chew. He reports that he does not drink alcohol or use drugs.    Allergies: No Known Allergies    Medications Prior to Admission  Medication Sig Dispense Refill  . aspirin EC 81 MG tablet Take 1 tablet (81 mg total) by mouth daily.    Marland Kitchen atorvastatin (LIPITOR) 80 MG tablet Take 1 tablet (80 mg total) by mouth at bedtime. IM program 90 tablet 3  . dorzolamide-timolol (COSOPT) 22.3-6.8 MG/ML ophthalmic solution Place 1 drop into the right eye 2 (two) times daily.   10  . ferrous gluconate (FERGON) 324 MG tablet Take 324 mg by mouth daily with breakfast.    . finasteride (PROSCAR) 5 MG tablet Take 5 mg by mouth daily.    . furosemide (LASIX) 80 MG tablet Take 1 tablet (80 mg total) by mouth daily. 30 tablet 1  . hydrALAZINE (APRESOLINE) 25 MG tablet Take 3 tablets (75 mg total) by mouth 3 (three) times daily. 270 tablet 0  . isosorbide dinitrate (ISORDIL) 20 MG tablet Take 2 tablets (40 mg total) by mouth 3 (three) times daily. 90 tablet 1  . metoprolol succinate (TOPROL-XL) 50 MG 24 hr tablet Take 1 tablet (50 mg total) by mouth daily. Take with or immediately following a meal. 30 tablet 1  . sodium bicarbonate 650 MG tablet Take 650 mg by mouth 2 (two) times daily.    . tamsulosin (FLOMAX) 0.4 MG CAPS capsule Take 0.4 mg by mouth daily after supper.    . vitamin B-12 (CYANOCOBALAMIN) 1000 MCG tablet Take 1,000 mcg by mouth daily.    Marland Kitchen ACCU-CHEK FASTCLIX LANCETS MISC Check blood sugar up to 7 times a week as instructed (Patient taking differently: 1 each by Other route See admin instructions. Check blood sugar up to 7 times a week as instructed) 102 each 3  . Blood Glucose Monitoring Suppl (ACCU-CHEK GUIDE) w/Device KIT 1 each by Does not apply route daily. Check blood sugar as instructed up to 7 times a  week (Patient taking differently: 1 each by Does not apply route See admin instructions. Check blood sugar as instructed up to 7 times a week) 1 kit 0  . glucose blood (ACCU-CHEK GUIDE) test strip Check blood sugar up to 7 times a week as instructed (Patient taking differently: 1 each by Other route See admin instructions. Check blood sugar up to 7 times a week as instructed) 100 each 3    Home: Home Living Family/patient expects to be discharged to:: Private residence Living Arrangements: Spouse/significant other Available Help at Discharge: Family Type of Home: Apartment Home Access: Stairs to enter Technical brewer of Steps: 6 Entrance Stairs-Rails: Left Home Layout: One level, Able to live on main level with bedroom/bathroom Bathroom Shower/Tub: Chiropodist: Handicapped height Bathroom Accessibility: Yes Home Equipment: Bedside commode, Wheelchair - manual, [employer] -  quad, Walker - 2 wheels Additional Comments: girlfriend works, his children live near by and can come help him PRN  Functional History: Prior Function Level of Independence: Needs assistance Gait / Transfers Assistance Needed: usually ambulates with walker or cane, limited community distances ADL's / Homemaking Assistance Needed: independent with dressing, and sponge baths, girlfriend does cooking and cleaning Functional Status:  Mobility: Bed Mobility Overal bed mobility: Needs Assistance Bed Mobility: Rolling, Sidelying to Sit Rolling: Min assist Sidelying to sit: Mod assist, +2 for physical assistance, HOB elevated Sit to sidelying: Min guard General bed mobility comments: Multimodal cues and increased time. Assist to bring feet off bed and push trunk up into sitting.  Transfers Overall transfer level: Needs assistance Equipment used: Rolling walker (2 wheeled) Transfers: Sit to/from Stand, [employer] Transfers Sit to Stand: Mod assist, Min assist, +2 physical assistance, +2  safety/equipment Stand pivot transfers: Min assist, +2 physical assistance, +2 safety/equipment General transfer comment: Initially required mod assist x 2 to stand from bed, progressing to min assist for additional sit/stands. Multimodal cues and gestures to complete task. Increased time needed. Assist to manuever RW during transfers.  Ambulation/Gait Ambulation/Gait assistance: Min Web designer (Feet): 20 Feet Assistive device: Quad cane Gait Pattern/deviations: Step-through pattern, Decreased step length - right, Decreased step length - left, Shuffle, Trunk flexed General Gait Details: slow, mildly unsteady gait, reaching out for bed rail and sink with ambulation to door and back, reports he was walking better before he came to hospital Gait velocity: slowed Gait velocity interpretation: 1.31 - 2.62 ft/sec, indicative of limited community ambulator    ADL: ADL Overall ADL's : Needs assistance/impaired Eating/Feeding: Sitting, Minimal assistance Eating/Feeding Details (indicate cue type and reason): While seated in bedside chair. Assist to open containers. Pt utilized right hand, able to hold and load utensils. Pt placed left hand underneath utensil in right as added precaution in case food is dropped. Pt required increased time to complete. Occasional jerking of BUEs.  Grooming: Minimal assistance, Sitting Upper Body Bathing: Moderate assistance, Minimal assistance, Sitting Lower Body Bathing: Maximal assistance, Sitting/lateral leans, Sit to/from stand Upper Body Dressing : Moderate assistance, Sitting Lower Body Dressing: Maximal assistance, Sit to/from stand, Sitting/lateral leans Toilet Transfer: Minimal assistance, +2 for physical assistance, +2 for safety/equipment, BSC Toilet Transfer Details (indicate cue type and reason): Able to stand pivot with increased time and gestures to complete.  Toileting- Clothing Manipulation and Hygiene: Total assistance, Sit to/from  stand Toileting - Clothing Manipulation Details (indicate cue type and reason): To manage clothing and complete peri care following BM.  Functional mobility during ADLs: Minimal assistance, +2 for physical assistance, +2 for safety/equipment, Rolling walker General ADL Comments: Pt tolerated sitting EOB ~10 min with min guard to ensure balance and safety. Pt able to transfer to/from [facility] and to bedside chair with RW and min assist x 2 for balance and safety. Assist to manuever RW.   Cognition: Cognition Overall Cognitive Status: Difficult to assess Orientation Level: Oriented to person, Oriented to place, Oriented to situation, Disoriented to time Cognition Arousal/Alertness: Awake/alert Behavior During Therapy: Flat affect Overall Cognitive Status: Difficult to assess Area of Impairment: Following commands Following Commands: Follows one step commands inconsistently, Follows one step commands with increased time General Comments: Provided max multimodal cues for all tasks. Pt does better following commands with gestures or when task is demonstrated to him. Increased time needed to complete all tasks. Pt continually states "Oh!" throughout session.   Difficult to assess due to: Impaired  communication   Blood pressure (!) 177/81, pulse 67, temperature 98.1 F (36.7 C), temperature source Oral, resp. rate 16, height 6' (1.829 m), weight 67 kg, SpO2 99 %. Physical Exam  Nursing note and vitals reviewed. Constitutional: He is oriented to person, place, and time. He appears well-developed and well-nourished.  HENT:  Head: Normocephalic and atraumatic.  Eyes: Pupils are equal, round, and reactive to light. Conjunctivae are normal.  Cardiovascular: Normal rate, regular rhythm and normal heart sounds. Exam reveals no friction rub.  No murmur heard. Respiratory: Effort normal and breath sounds normal. No stridor. No respiratory distress. He has no wheezes.  GI: Soft. Bowel sounds are normal. He  exhibits no distension. There is no abdominal tenderness.  Musculoskeletal:     Comments: Stasis changes BLE.   Neurological: He is alert and oriented to person, place, and time.  Speech clear. He had difficulty following 2 step commands.  Oriented x3 Motor strength is 4/5 bilateral deltoid bicep tricep grip hip flexion knee extension ankle dorsiflexion  Sensation reduced in the toes of both feet  Skin: Skin is warm and dry.  Psychiatric: He has a normal mood and affect.    Results for orders placed or performed during the hospital encounter of 11/06/19 (from the past 24 hour(s))  CBC     Status: Abnormal   Collection Time: 11/16/19  7:10 AM  Result Value Ref Range   WBC 6.1 4.0 - 10.5 K/uL   RBC 3.02 (L) 4.22 - 5.81 MIL/uL   Hemoglobin 8.8 (L) 13.0 - 17.0 g/dL   HCT 26.9 (L) 39.0 - 52.0 %   MCV 89.1 80.0 - 100.0 fL   MCH 29.1 26.0 - 34.0 pg   MCHC 32.7 30.0 - 36.0 g/dL   RDW 13.3 11.5 - 15.5 %   Platelets 259 150 - 400 K/uL   nRBC 0.0 0.0 - 0.2 %   No results found.   Assessment/Plan: Diagnosis: Encephalopathy toxic/metabolic and debility related to coronary artery disease, 1. Does the need for close, 24 hr/day medical supervision in concert with the patient's rehab needs make it unreasonable for this patient to be served in a less intensive setting? Yes 2. Co-Morbidities requiring supervision/potential complications: Congestive heart failure, CKD 4, history of anasarca 3. Due to bladder management, bowel management, safety, skin/wound care, disease management, medication administration, pain management and patient education, does the patient require 24 hr/day rehab nursing? Yes 4. Does the patient require coordinated care of a physician, rehab nurse, therapy disciplines of PT, OT, speech to address physical and functional deficits in the context of the above medical diagnosis(es)? Yes Addressing deficits in the following areas: balance, endurance, locomotion, strength,  transferring, bowel/bladder control, bathing, dressing, feeding, grooming, toileting, cognition and psychosocial support 5. Can the patient actively participate in an intensive therapy program of at least 3 hrs of therapy per day at least 5 days per week? Potentially 6. The potential for patient to make measurable gains while on inpatient rehab is fair 7. Anticipated functional outcomes upon discharge from inpatient rehab are supervision  with PT, supervision with OT, supervision with SLP. 8. Estimated rehab length of stay to reach the above functional goals is: 8 to 12 days 9. Anticipated discharge destination: Home 10. Overall Rehab/Functional Prognosis: good  RECOMMENDATIONS: This patient's condition is appropriate for continued rehabilitative care in the following setting: CIR once tolerating therapy off nitroglycerin. Patient has agreed to participate in recommended program. Cognitively unable to make this decision, significant other would like him  to come to rehab Note that insurance prior authorization may be required for reimbursement for recommended care.  Comment: Still on nitroglycerin drip, also recommended to have cardiac catheterization.  This was postponed due to punctate parietal infarct.  Neurology felt it was safe to undergo catheterization from a stroke standpoint.  Optimal timing of rehab would be after cardiac catheterization in order to assess coronary arteries prior to increasing exercise demand.    Bary Leriche, PA-C 11/16/2019   "I have personally performed a face to face diagnostic evaluation of this patient.  Additionally, I have reviewed and concur with the physician assistant's documentation above." Charlett Blake M.D. [facility] FAAPM&R (Neuromuscular Med) Diplomate Am Board of Electrodiagnostic Med Fellow Am Board of Interventional Pain

## 2019-11-16 NOTE — Progress Notes (Signed)
Progress Note  Patient Name: Dennis Macias Date of Encounter: 11/16/2019  Primary Cardiologist: Larae Grooms, MD   Subjective   No CP or dyspnea  Inpatient Medications    Scheduled Meds: . amLODipine  5 mg Oral Daily  . aspirin EC  81 mg Oral Daily  . atorvastatin  80 mg Oral QHS  . carvedilol  12.5 mg Oral BID WC  . darbepoetin (ARANESP) injection - NON-DIALYSIS  40 mcg Subcutaneous Q Tue-1800  . dorzolamide-timolol  1 drop Right Eye BID  . ferrous gluconate  324 mg Oral Q breakfast  . finasteride  5 mg Oral Daily  . heparin  5,000 Units Subcutaneous Q8H  . hydrALAZINE  100 mg Oral TID  . lidocaine  5 mL Intradermal Once  . mouth rinse  15 mL Mouth Rinse BID  . sodium chloride flush  3 mL Intravenous Q12H  . tamsulosin  0.4 mg Oral QPC supper  . vitamin B-12  1,000 mcg Oral Daily   Continuous Infusions: . sodium chloride    . sodium chloride    . nitroGLYCERIN 15 mcg/min (11/16/19 0718)   PRN Meds: sodium chloride, acetaminophen **OR** acetaminophen, hydrALAZINE, sodium chloride flush   Vital Signs    Vitals:   11/15/19 2133 11/16/19 0033 11/16/19 0433 11/16/19 0819  BP: (!) 160/77 (!) 165/78 (!) 159/78   Pulse:  68 77   Resp:  17 16   Temp:  98.2 F (36.8 C) 98.3 F (36.8 C) 98.1 F (36.7 C)  TempSrc:  Oral Oral Oral  SpO2:  99% 99%   Weight:   67 kg   Height:        Intake/Output Summary (Last 24 hours) at 11/16/2019 0820 Last data filed at 11/16/2019 7353 Gross per 24 hour  Intake 141.56 ml  Output 2250 ml  Net -2108.44 ml   Last 3 Weights 11/16/2019 11/15/2019 11/14/2019  Weight (lbs) 147 lb 11.2 oz 143 lb 9.6 oz 160 lb 0.9 oz  Weight (kg) 66.996 kg 65.137 kg 72.6 kg      Telemetry    Sinus - Personally Reviewed   Physical Exam   GEN: No acute distress.   Neck: No JVD Cardiac: RRR, no murmurs, rubs, or gallops.  Respiratory: Clear to auscultation bilaterally. GI: Soft, nontender, non-distended  MS: No edema Neuro:  Nonfocal  Psych:  Normal affect   Labs    High Sensitivity Troponin:   Recent Labs  Lab 11/06/19 0935 11/06/19 1924 11/06/19 2235  TROPONINIHS 1,618* 2,384* 2,233*      Chemistry Recent Labs  Lab 11/12/19 1039 11/12/19 1039 11/13/19 0419 11/14/19 0748 11/15/19 0336  NA 143   < > 144 142 141  K 4.0   < > 3.6 3.3* 3.1*  CL 115*   < > 111 106 106  CO2 16*   < > 18* 23 26  GLUCOSE 108*   < > 95 106* 123*  BUN 56*   < > 53* 46* 38*  CREATININE 3.94*   < > 3.65* 3.42* 3.34*  CALCIUM 8.5*   < > 8.5* 8.3* 8.1*  PROT 5.1*  --   --   --   --   ALBUMIN 1.9*   < > 2.0* 2.0* 2.1*  AST 12*  --   --   --   --   ALT 10  --   --   --   --   ALKPHOS 42  --   --   --   --  BILITOT 1.1  --   --   --   --   GFRNONAA 15*   < > 16* 17* 18*  GFRAA 17*   < > 19* 20* 21*  ANIONGAP 12   < > 15 13 9    < > = values in this interval not displayed.     Hematology Recent Labs  Lab 11/14/19 0748 11/15/19 0336 11/16/19 0710  WBC 7.0 7.9 6.1  RBC 2.59* 2.85* 3.02*  HGB 7.6* 8.4* 8.8*  HCT 22.8* 25.0* 26.9*  MCV 88.0 87.7 89.1  MCH 29.3 29.5 29.1  MCHC 33.3 33.6 32.7  RDW 13.2 13.1 13.3  PLT 217 230 259    Patient Profile     69 y.o.malewith past medical history of coronary artery disease, ischemic cardiomyopathy, hypertension, hyperlipidemia, chronic stage IV kidney disease, chronic indwelling Foley catheter, history of CVA for evaluation of syncope. Echocardiogram April 2021 showed ejection fraction 40 to 98%, grade 1 diastolic dysfunction, severe left atrial enlargement, mild mitral regurgitation. Nuclear study April 2021 showed ejection fraction 40%. There was apical infarction with peri-infarct ischemia. Patient has been treated medically due to renal insufficiency and lack of symptoms.  Plan had been for cardiac catheterization but patient developed altered mental status, CVA and fever.  Assessment & Plan    1 non-ST elevation myocardial infarction-plan previously had been to proceed with  cardiac catheterization.  However he has had multiple complications during this hospitalization including CVA, encephalopathy and fever.  We will continue medical therapy at this point.  Continue aspirin, statin and carvedilol.  Can consider cardiac catheterization in the future if he continues to improve.  2 syncope-etiology unclear.  Given history of cardiomyopathy, ischemia on previous nuclear study I was concerned about possibility of ventricular arrhythmia.  As outlined above plan was for cardiac catheterization eventually but this has been placed on hold.  3 recent CVA-neurology following.  4 cardiomyopathy-continue carvedilol.  He is not on an ACE inhibitor, ARB or Entresto due to renal insufficiency.  Continue hydralazine.  Discontinue nitroglycerin.  Add Imdur 60 mg daily.  5 chronic stage IV kidney disease-nephrology following.  6 Pseudomonas UTI-continue antibiotics.  7 hypertension-continue present medical regimen and advance as needed.   For questions or updates, please contact [place] Please consult [email or web address] for contact info under        Signed, Kirk Ruths, MD  11/16/2019, 8:20 AM

## 2019-11-17 ENCOUNTER — Ambulatory Visit: Payer: Medicare PPO | Admitting: Interventional Cardiology

## 2019-11-17 ENCOUNTER — Encounter (INDEPENDENT_AMBULATORY_CARE_PROVIDER_SITE_OTHER): Payer: Medicare PPO | Admitting: Ophthalmology

## 2019-11-17 LAB — CBC
HCT: 28.5 % — ABNORMAL LOW (ref 39.0–52.0)
Hemoglobin: 9.2 g/dL — ABNORMAL LOW (ref 13.0–17.0)
MCH: 29.1 pg (ref 26.0–34.0)
MCHC: 32.3 g/dL (ref 30.0–36.0)
MCV: 90.2 fL (ref 80.0–100.0)
Platelets: 281 10*3/uL (ref 150–400)
RBC: 3.16 MIL/uL — ABNORMAL LOW (ref 4.22–5.81)
RDW: 13.5 % (ref 11.5–15.5)
WBC: 6.8 10*3/uL (ref 4.0–10.5)
nRBC: 0 % (ref 0.0–0.2)

## 2019-11-17 LAB — RENAL FUNCTION PANEL
Albumin: 2 g/dL — ABNORMAL LOW (ref 3.5–5.0)
Anion gap: 12 (ref 5–15)
BUN: 39 mg/dL — ABNORMAL HIGH (ref 8–23)
CO2: 25 mmol/L (ref 22–32)
Calcium: 8.9 mg/dL (ref 8.9–10.3)
Chloride: 105 mmol/L (ref 98–111)
Creatinine, Ser: 3.56 mg/dL — ABNORMAL HIGH (ref 0.61–1.24)
GFR calc Af Amer: 19 mL/min — ABNORMAL LOW (ref >60)
GFR calc non Af Amer: 17 mL/min — ABNORMAL LOW (ref >60)
Glucose, Bld: 111 mg/dL — ABNORMAL HIGH (ref 70–99)
Phosphorus: 3.1 mg/dL (ref 2.5–4.6)
Potassium: 3.5 mmol/L (ref 3.5–5.1)
Sodium: 142 mmol/L (ref 135–145)

## 2019-11-17 MED ORDER — POTASSIUM CHLORIDE CRYS ER 20 MEQ PO TBCR
40.0000 meq | EXTENDED_RELEASE_TABLET | Freq: Every day | ORAL | Status: AC
  Administered 2019-11-17: 40 meq via ORAL
  Filled 2019-11-17: qty 2

## 2019-11-17 MED ORDER — CARVEDILOL 6.25 MG PO TABS
18.7500 mg | ORAL_TABLET | Freq: Two times a day (BID) | ORAL | Status: DC
  Administered 2019-11-17 (×2): 18.75 mg via ORAL
  Filled 2019-11-17 (×2): qty 1

## 2019-11-17 NOTE — PMR Pre-admission (Signed)
PMR Admission Coordinator Pre-Admission Assessment  Patient: Dennis Macias is an 69 y.o., male MRN: [id number] DOB: 10-01-1950 Height: 6' (182.9 cm) Weight: 77.5 kg              Insurance Information HMO: yes    PPO:      PCP:      IPA:      80/20:      OTHER:  PRIMARY: [employer]      Policy#: [id number]      Subscriber: patient CM Name: Dot did review; received verbal approval from Lestine Box      Phone#: [phone]     Fax#: [phone] Pre-Cert#: [id number]      Employer:  Josem Kaufmann of [phone] provided by Jeanette Caprice for admit to CIR. Planned admit date 5/19 with next review date 5/26. F/u CM is Lestine Box (p): [phone] (f): [phone] Benefits:  Phone #: online at [email or web address], transaction ID #[phone]     Name:  Eff. Date: 07/10/19-07/08/20     Deduct: $500 ($112.78 met)      Out of Pocket Max: $6,700 ($6,700 met)      Life Max: NA  CIR: $450/day co-pay for days 1-4, $0/day co-pay for days 5-90.       SNF: 100% coverage, 0% co-insurance; limited by medical necessity  Outpatient: $20-$40/visit co-pay pending service; visits limited by medical necessity Home Health: 100% coverage, 0% co-insurance; limited by medical necessity       DME: 80% coverage, 20% co-insurance Providers:  SECONDARY: None      Policy#:       Phone#:   Development worker, community:       Phone#:   [employer] Engineer, [employer]" for patients in Inpatient Rehabilitation Facilities with attached "Privacy Act [place] Records" was provided and verbally reviewed with: Patient and Family  Emergency Contact Information Contact Information    Name Relation Home Work Mobile   [place] Spouse [phone]  [place] Daughter [phone]  [phone]     Current Medical History  Patient Admitting Diagnosis: Encephalopathy toxic/metabolic and debility related to coronary artery disease  History of Present Illness: Dennis Macias is a 69 y.o. male with history  of HTN, T2DM, BPH- s/p TURP/foley, prior CVA, recent admission for anasarca s/p thoracocentesis and IV diuresis; who was admitted on 11/06/19 after syncopal episode. He was found to have elevated troponin's due to NSTEMI as well as  frequent PVC's with NSVT. UDS negative. He developed fevers and chills past admission. He was started on cefepime for bacteremia due to pseudomonas UTI.  He developed progressive lethargy with confusion on 05/05 with BUE tremors felt to be due to toxic metabolic encephalopathy. On 05/06, he was found to have global aphasia and MRI/MRA brain done revealing acute 10 mm left parietal lobe subcortical infarct, chronic left thalamic infarct, advanced chronic small vessel disease and no intracranial stenosis.  EEG was suggestive of moderate diffuse nonspecific encephalopathy. Cefepime changed to [place] due to concerns of cefepime related encephalopathy.   Stroke felt to be incidental findings due to small vessel disease. Cardiology expressed concerns that ventricular arrhthymias in setting of CM could be a cause of syncope. Cardiac cath showed mid LAD lesion as well as ostial LAD lesion; given complicated nature of lesion, he did not undergo PCI at that time.  Acute on chronic renal failure improving with IVF for hydration. At time of PM&R consult he required multimodal cues to follow commands, occasional BUE tremors noted with decrease  in coordination, had limited verbal output and limitations in mobility and ADLs. CIR recommended due to functional decline. Since consult, pt does continue to show Improvement in mentation and all goals met regarding dysphagia treatment. Pt is to admit to CIR on 11/25/19.   No chest pains has nitroglycerin drip infusing Complete NIHSS TOTAL: 0 Glasgow Coma Scale Score: 15  Past Medical History  Past Medical History:  Diagnosis Date  . Anemia   . Arthritis    past hx   . Blindness    right eye  . Cataract    removed both eyes  . Dehydration    . Diabetes ([place])   . Glaucoma   . History of CVA (cerebrovascular accident) 09/13/2015  . History of urinary retention   . Hyperlipidemia   . Hypertension   . Pernicious anemia 02/24/2018  . Stroke [facility])    2017- March  . Syncope 11/2019  . Tachycardia 08/26/2017  . Tubular adenoma of colon 02/2017  . Weight loss, non-intentional 08/26/2017   10 lbs between 6/18 & 2/19    Family History  family history includes Hyperlipidemia in his father and mother; Hypertension in his mother.  Prior Rehab/Hospitalizations:  Has the patient had prior rehab or hospitalizations prior to admission? Yes  Has the patient had major surgery during 100 days prior to admission? Yes  Current Medications   Current Facility-Administered Medications:  .  0.9 %  sodium chloride infusion, 250 mL, Intravenous, PRN, Jettie Booze, MD .  acetaminophen (TYLENOL) tablet 650 mg, 650 mg, Oral, Q6H PRN, 650 mg at 11/25/19 0750 **OR** acetaminophen (TYLENOL) suppository 650 mg, 650 mg, Rectal, Q6H PRN, Larae Grooms S, MD .  amLODipine (NORVASC) tablet 5 mg, 5 mg, Oral, Daily, Larae Grooms S, MD, 5 mg at 11/25/19 1054 .  aspirin chewable tablet 81 mg, 81 mg, Oral, Daily, Jettie Booze, MD, 81 mg at 11/25/19 1053 .  atorvastatin (LIPITOR) tablet 80 mg, 80 mg, Oral, QHS, Jettie Booze, MD, 80 mg at 11/24/19 2158 .  carvedilol (COREG) tablet 25 mg, 25 mg, Oral, BID WC, Jettie Booze, MD, 25 mg at 11/25/19 0749 .  clopidogrel (PLAVIX) tablet 75 mg, 75 mg, Oral, Daily, Jettie Booze, MD, 75 mg at 11/25/19 1053 .  Darbepoetin Alfa (ARANESP) injection 100 mcg, 100 mcg, Subcutaneous, Q Tue-1800, Dwana Melena, MD, 100 mcg at 11/24/19 1639 .  dorzolamide-timolol (COSOPT) 22.3-6.8 MG/ML ophthalmic solution 1 drop, 1 drop, Right Eye, BID, Jettie Booze, MD, 1 drop at 11/25/19 1249 .  feeding supplement (ENSURE ENLIVE) (ENSURE ENLIVE) liquid 237 mL, 237 mL, Oral, TID BM, Gilles Chiquito B, MD, 237 mL at 11/25/19 1249 .  ferrous gluconate (FERGON) tablet 324 mg, 324 mg, Oral, Q breakfast, Jettie Booze, MD, 324 mg at 11/25/19 0749 .  finasteride (PROSCAR) tablet 5 mg, 5 mg, Oral, Daily, Larae Grooms S, MD, 5 mg at 11/25/19 1053 .  heparin injection 5,000 Units, 5,000 Units, Subcutaneous, Q8H, Jettie Booze, MD, 5,000 Units at 11/25/19 9833 .  hydrALAZINE (APRESOLINE) tablet 75 mg, 75 mg, Oral, TID, [name], MD, 75 mg at 11/25/19 1054 .  isosorbide mononitrate (IMDUR) 24 hr tablet 60 mg, 60 mg, Oral, Daily, Jettie Booze, MD, 60 mg at 11/25/19 1054 .  MEDLINE mouth rinse, 15 mL, Mouth Rinse, BID, Jettie Booze, MD, 15 mL at 11/25/19 1249 .  ondansetron (ZOFRAN) injection 4 mg, 4 mg, Intravenous, Q6H PRN, Jettie Booze, MD .  sodium chloride flush (NS) 0.9 % injection 3 mL, 3 mL, Intravenous, Q12H, Jettie Booze, MD, 3 mL at 11/25/19 1250 .  sodium chloride flush (NS) 0.9 % injection 3 mL, 3 mL, Intravenous, PRN, Larae Grooms S, MD .  tamsulosin [facility]) capsule 0.4 mg, 0.4 mg, Oral, QPC supper, Larae Grooms S, MD, 0.4 mg at 11/24/19 1638 .  vitamin B-12 (CYANOCOBALAMIN) tablet 1,000 mcg, 1,000 mcg, Oral, Daily, Jettie Booze, MD, 1,000 mcg at 11/25/19 1053  Patients Current Diet:  Diet Order            Diet regular Room service appropriate? Yes with Assist; Fluid consistency: Thin  Diet effective now              Precautions / Restrictions Precautions Precautions: Fall Precaution Comments: monitor for agitation Restrictions Weight Bearing Restrictions: No   Has the patient had 2 or more falls or a fall with injury in the past year?No  Prior Activity Level Limited Community (1-2x/wk): retired; did not drive; would go out for appointments mostly  Prior Functional Level Prior Function Level of Independence: Needs assistance Gait / Transfers Assistance Needed: usually ambulates with  walker or cane, limited community distances ADL's / Homemaking Assistance Needed: independent with dressing, and sponge baths, girlfriend does cooking and cleaning  Self Care: Did the patient need help bathing, dressing, using the toilet or eating?  Independent  Indoor Mobility: Did the patient need assistance with walking from room to room (with or without device)? Independent  Stairs: Did the patient need assistance with internal or external stairs (with or without device)? Independent  Functional Cognition: Did the patient need help planning regular tasks such as shopping or remembering to take medications? Needed some help  Home Assistive Devices / Equipment Home Assistive Devices/Equipment: Chana Bode (specify type) Home Equipment: Bedside commode, Wheelchair - manual, Cane - quad, Walker - 2 wheels  Prior Device Use: Indicate devices/aids used by the patient prior to current illness, exacerbation or injury? no AD in the home; cane for community mobility   Current Functional Level Cognition  Overall Cognitive Status: Within Functional Limits for tasks assessed Difficult to assess due to: Impaired communication Orientation Level: (P) Oriented X4 Following Commands: (P) Follows one step commands inconsistently(requires v/c's for sequencing and attention) Safety/Judgement: Decreased awareness of deficits, Decreased awareness of safety General Comments: reminders of sequence and safety needed    Extremity Assessment (includes Sensation/Coordination)  Upper Extremity Assessment: Generalized weakness, RUE deficits/detail, LUE deficits/detail, Difficult to assess due to impaired cognition RUE Deficits / Details: RUE AROM shld flex to 90 degrees. Pt unable to follow additional instructions PROM elbow, wrist, and digits WFL. Pt able to minimally squeeze therapists hands once demonstrated.  RUE Coordination: decreased gross motor(Occasional jerking movements of BUE.) LUE Deficits /  Details: LUE AROM shld flex to 80 degrees. Pt unable to follow additional instructions PROM elbow, wrist, and digits WFL. Pt able to minimally squeeze therapists hands once demonstrated.  LUE Coordination: decreased gross motor(Occasional jerking movements of BUE.)  Lower Extremity Assessment: Defer to PT evaluation    ADLs  Overall ADL's : Needs assistance/impaired Eating/Feeding: Sitting, Minimal assistance Eating/Feeding Details (indicate cue type and reason): While seated in bedside chair. Assist to open containers. Pt utilized right hand, able to hold and load utensils. Pt placed left hand underneath utensil in right as added precaution in case food is dropped. Pt required increased time to complete. Occasional jerking of BUEs.  Grooming: Minimal assistance, Sitting Upper Body Bathing:  Moderate assistance, Minimal assistance, Sitting Lower Body Bathing: Maximal assistance, Sitting/lateral leans, Sit to/from stand Upper Body Dressing : Moderate assistance, Sitting Lower Body Dressing: Supervision/safety, Set up Lower Body Dressing Details (indicate cue type and reason): Min A sit<stand from recliner (lower surface) Toilet Transfer: Minimal assistance, Ambulation, RW Toilet Transfer Details (indicate cue type and reason): recliner around to bed Toileting- Clothing Manipulation and Hygiene: Total assistance, Sit to/from stand Toileting - Clothing Manipulation Details (indicate cue type and reason): To manage clothing and complete peri care following BM.  Functional mobility during ADLs: Minimal assistance, +2 for physical assistance, +2 for safety/equipment, Rolling walker General ADL Comments: Pt tolerated sitting EOB ~10 min with min guard to ensure balance and safety. Pt able to transfer to/from [facility] and to bedside chair with RW and min assist x 2 for balance and safety. Assist to manuever RW.     Mobility  Overal bed mobility: Needs Assistance Bed Mobility: Supine to Sit Rolling: Min  guard Sidelying to sit: Min assist, HOB elevated Supine to sit: HOB elevated, Min guard(increased time and effort with use of bedrail ) Sit to supine: Min guard Sit to sidelying: Min guard General bed mobility comments: up in chair when PT arrived    Transfers  Overall transfer level: Needs assistance Equipment used: Rolling walker (2 wheeled), 1 person hand held assist Transfers: Sit to/from Stand Sit to Stand: Mod assist Stand pivot transfers: Min assist, Mod assist General transfer comment: 100% reminders for hand placement    Ambulation / Gait / Stairs / Wheelchair Mobility  Ambulation/Gait Ambulation/Gait assistance: Herbalist (Feet): 38 Feet Assistive device: Rolling walker (2 wheeled) Gait Pattern/deviations: Step-through pattern, Wide base of support, Decreased stride length General Gait Details: min assist for navigating obstacles and setting up to sit Gait velocity: decreased Gait velocity interpretation: <1.31 ft/sec, indicative of household ambulator Stairs: Yes Stairs assistance: Min assist Stair Management: Two rails, Step to pattern, Forwards, Backwards Number of Stairs: 4 General stair comments: pt is getting up with mod assist and is mildly buckling only once on the second step, then more controlled with cues    Posture / Balance Dynamic Sitting Balance Sitting balance - Comments: able to dynamically reach feet with supervision Balance Overall balance assessment: Needs assistance Sitting-balance support: Feet supported Sitting balance-Leahy Scale: Good Sitting balance - Comments: able to dynamically reach feet with supervision Standing balance support: Bilateral upper extremity supported, During functional activity Standing balance-Leahy Scale: Poor Standing balance comment: reliant on RW    Special needs/care consideration Diabetic management: yes, Behavioral consideration: come noncompliance with therapy and Designated visitor: [name] and Lynn (from acute therapy documentation) Living Arrangements: Spouse/significant other Available Help at Discharge: Family Type of Home: Apartment Home Layout: One level, Able to live on main level with bedroom/bathroom Home Access: Stairs to enter Entrance Stairs-Rails: Left Entrance Stairs-Number of Steps: 6 Bathroom Shower/Tub: Chiropodist: Handicapped height Bathroom Accessibility: Yes [place]: No Additional Comments: girlfriend works, his children live near by and can come help him PRN  Discharge Living Setting Plans for Discharge Living Setting: Lives with (comment), Apartment(wife) Type of Home at Discharge: Apartment(basement apartment (steps lead down to basement)) Discharge Home Layout: One level Discharge Home Access: Stairs to enter Entrance Stairs-Rails: Left Entrance Stairs-Number of Steps: 7 Discharge Bathroom Shower/Tub: Tub/shower unit(mostly been completing sink baths) Discharge Bathroom Toilet: Handicapped height Discharge Bathroom Accessibility: Yes How Accessible: Accessible via walker Does  the patient have any problems obtaining your medications?: No  Social/Family/Support Systems Patient Roles: Spouse Contact Information: [name] (wife): [phone] Anticipated Caregiver: wife + family members Anticipated Caregiver's Contact Information: see above Ability/Limitations of Caregiver: supervision Caregiver Availability: 24/7 Discharge Plan Discussed with Primary Caregiver: Yes(with pt and wife) Is Caregiver In Agreement with Plan?: Yes Does Caregiver/Family have Issues with Lodging/Transportation while Pt is in Rehab?: No   Goals Patient/Family Goal for Rehab: PT/OT/SLP: Mod I/Supervision Expected length of stay: 7-10 days Pt/Family Agrees to Admission and willing to participate: Yes Program Orientation Provided & Reviewed with Pt/Caregiver Including Roles  & Responsibilities: Yes(pt and  wife)  Barriers to Discharge: Home environment access/layout  Barriers to Discharge Comments: steps to enter home   Decrease burden of Care through IP rehab admission: NA   Possible need for SNF placement upon discharge:Not anticipated. Pt has good social support at DC from his wife and family members. Anticipate he can reach a supervision level through CIR program to return home safely with family support.    Patient Condition: This patient's medical and functional status has changed since the consult dated: 11/16/19 in which the Rehabilitation Physician determined and documented that the patient's condition is appropriate for intensive rehabilitative care in an inpatient rehabilitation facility. See "History of Present Illness" (above) for medical update. Functional changes are: improvement in functional transfers from Min/Mod A +2 to Min A, improvement in gait 20 feet with Min A to 25 feet Min A +2, and improvement in overall ADLs from Mod A/Total A to Supervision/Min A. Patient's medical and functional status update has been discussed with the Rehabilitation physician and patient remains appropriate for inpatient rehabilitation. Will admit to inpatient rehab today.  Preadmission Screen Completed By:  Raechel Ache, OT, 11/25/2019 2:51 PM ______________________________________________________________________   Discussed status with Dr. Posey Pronto on 11/25/19 at 2:48PM and received approval for admission today.  Admission Coordinator:  Raechel Ache, time 2:48PM/Date 11/25/19

## 2019-11-17 NOTE — Progress Notes (Signed)
MD called back and placed order for telesitter and will come by and speak to the patient.

## 2019-11-17 NOTE — Progress Notes (Signed)
After doctors were able to speak to the patient, Dennis Macias was more cooperative and calmed down.  He asked for some food and meds were given to him.

## 2019-11-17 NOTE — Progress Notes (Signed)
Inpatient Rehabilitation-Admissions Coordinator   Confirmed with wife that CIR is a program she would like to pursue. She also confirmed a stable DC support plan for 24/7 Supervision. Noted pt refused SLP and OT sessions today. Will need to see greater participation with therapies before admission to CIR. It appears this patient is awaiting a cardiac cath sometime this week. Will follow for completion of medical work. As long as pt continues to have IP Rehab needs once cardiac cath has been completed, AC will begin insurance authorization for possible admit.   Will follow along.   Raechel Ache, OTR/L  Rehab Admissions Coordinator  [phone] 11/17/2019 1:50 PM

## 2019-11-17 NOTE — Progress Notes (Signed)
Night team progress note:  Patient was seen and evaluated at bedside after being paged by RN about patient is irritable and not answer question. When arrived to patient's room, he was sitting on the bed no agitation and is able to answer our question. He knows he is at [facility] but when asking about time.  " I am tired of people asking me what is the day, month and year every day.  He says that he does not feel good because he peed on his bed and does not feel comfortable sitting in a wet bed. No other complaint.  No change in mental status and no concerning finding on evaluation.  -Asked RN to change patient's bed  -Put the condom cath on   [name], MD IM PGY-2

## 2019-11-17 NOTE — Progress Notes (Signed)
SLP Cancellation Note  Patient Details Name: Dennis Macias MRN: [id number] DOB: 27-Aug-1950   Cancelled treatment:       Reason Eval/Treat Not Completed: Patient declined, no reason specified.   New order for BSE received - pt already on speech therapy caseload for dysphagia.   Pt stated he wants "to be left alone". RN reports pt accepted po meds this morning, and did not exhibit difficulty. Otherwise, he has refused to eat today  Stiven Kaspar B. Quentin Ore, [facility], [place] Speech Language Pathologist Office: [phone] Pager: [phone]  Shonna Chock 11/17/2019, 11:30 AM

## 2019-11-17 NOTE — Progress Notes (Signed)
OT Cancellation Note  Patient Details Name: Dennis Macias MRN: [id number] DOB: [birth year]   Cancelled Treatment:    Reason Eval/Treat Not Completed: Patient declined, no reason specified. Pt declined OT services today, reports "I told you to leave me alone. I told you I ain't doing nothing." Will follow and see as able.   Jolaine Artist, OT Acute Rehabilitation Services Pager [phone] Office [phone]    Delight Stare 11/17/2019, 11:11 AM

## 2019-11-17 NOTE — Progress Notes (Signed)
Progress Note  Patient Name: Dennis Macias Date of Encounter: 11/17/2019  Primary Cardiologist: Larae Grooms, MD   Subjective   Pt denies CP or dyspnea  Inpatient Medications    Scheduled Meds: . amLODipine  5 mg Oral Daily  . atorvastatin  80 mg Oral QHS  . carvedilol  12.5 mg Oral BID WC  . clopidogrel  75 mg Oral Daily  . darbepoetin (ARANESP) injection - NON-DIALYSIS  40 mcg Subcutaneous Q Tue-1800  . dorzolamide-timolol  1 drop Right Eye BID  . ferrous gluconate  324 mg Oral Q breakfast  . finasteride  5 mg Oral Daily  . heparin  5,000 Units Subcutaneous Q8H  . hydrALAZINE  100 mg Oral TID  . isosorbide mononitrate  60 mg Oral Daily  . lidocaine  5 mL Intradermal Once  . mouth rinse  15 mL Mouth Rinse BID  . sodium chloride flush  3 mL Intravenous Q12H  . tamsulosin  0.4 mg Oral QPC supper  . vitamin B-12  1,000 mcg Oral Daily   Continuous Infusions: . sodium chloride    . sodium chloride     PRN Meds: sodium chloride, acetaminophen **OR** acetaminophen, hydrALAZINE, sodium chloride flush   Vital Signs    Vitals:   11/17/19 0052 11/17/19 0223 11/17/19 0303 11/17/19 0453  BP: (!) 166/71 (!) 165/81 (!) 166/80 (!) 172/75  Pulse: 67  63   Resp: 12 16 14    Temp: 98 F (36.7 C)  97.9 F (36.6 C)   TempSrc: Oral  Oral   SpO2: 97%  98%   Weight:   65.9 kg   Height:        Intake/Output Summary (Last 24 hours) at 11/17/2019 0717 Last data filed at 11/17/2019 0317 Gross per 24 hour  Intake 246 ml  Output 925 ml  Net -679 ml   Last 3 Weights 11/17/2019 11/16/2019 11/15/2019  Weight (lbs) 145 lb 4.5 oz 147 lb 11.2 oz 143 lb 9.6 oz  Weight (kg) 65.9 kg 66.996 kg 65.137 kg      Telemetry    Sinus with PACs and 4 beats NSVT - Personally Reviewed   Physical Exam   GEN: No acute distress.  WD Neck: No JVD, supple Cardiac: RRR Respiratory: CTA GI: Soft, NT/ND MS: No edema Neuro:  Grossly intact Psych: Normal affect   Labs    High Sensitivity  Troponin:   Recent Labs  Lab 11/06/19 0935 11/06/19 1924 11/06/19 2235  TROPONINIHS 1,618* 2,384* 2,233*      Chemistry Recent Labs  Lab 11/12/19 1039 11/13/19 0419 11/15/19 0336 11/16/19 0710 11/17/19 0248  NA 143   < > 141 143 142  K 4.0   < > 3.1* 3.5 3.5  CL 115*   < > 106 106 105  CO2 16*   < > 26 28 25   GLUCOSE 108*   < > 123* 125* 111*  BUN 56*   < > 38* 38* 39*  CREATININE 3.94*   < > 3.34* 3.52* 3.56*  CALCIUM 8.5*   < > 8.1* 8.5* 8.9  PROT 5.1*  --   --   --   --   ALBUMIN 1.9*   < > 2.1* 2.0* 2.0*  AST 12*  --   --   --   --   ALT 10  --   --   --   --   ALKPHOS 42  --   --   --   --   BILITOT  1.1  --   --   --   --   GFRNONAA 15*   < > 18* 17* 17*  GFRAA 17*   < > 21* 19* 19*  ANIONGAP 12   < > 9 9 12    < > = values in this interval not displayed.     Hematology Recent Labs  Lab 11/15/19 0336 11/16/19 0710 11/17/19 0248  WBC 7.9 6.1 6.8  RBC 2.85* 3.02* 3.16*  HGB 8.4* 8.8* 9.2*  HCT 25.0* 26.9* 28.5*  MCV 87.7 89.1 90.2  MCH 29.5 29.1 29.1  MCHC 33.6 32.7 32.3  RDW 13.1 13.3 13.5  PLT 230 259 281    Patient Profile     69 y.o.malewith past medical history of coronary artery disease, ischemic cardiomyopathy, hypertension, hyperlipidemia, chronic stage IV kidney disease, chronic indwelling Foley catheter, history of CVA for evaluation of syncope. Echocardiogram April 2021 showed ejection fraction 40 to 58%, grade 1 diastolic dysfunction, severe left atrial enlargement, mild mitral regurgitation. Nuclear study April 2021 showed ejection fraction 40%. There was apical infarction with peri-infarct ischemia. Patient had been treated medically due to renal insufficiency and lack of symptoms.  Plan following syncopal episode was for cardiac catheterization but patient developed altered mental status, CVA and fever.  Assessment & Plan    1 non-ST elevation myocardial infarction-plan previously had been to proceed with cardiac catheterization.   However he has had multiple complications during this hospitalization including CVA, encephalopathy felt related to cefepime and pseudomonas UTI.  Continue aspirin, statin and carvedilol.  Very difficult situation.  I think his risk from a cardiac standpoint is relatively high.  His risk for cardiac catheterization is also high particularly for contrast nephropathy.  I discussed this with him today and he would be willing to proceed with cardiac catheterization.  We will plan later this week as long as renal function is stable.  2 syncope-etiology unclear.  Given history of cardiomyopathy, ischemia on previous nuclear study and NSVT noted on telemetry I remain concerned about possibility of ventricular arrhythmia.  We will proceed with cardiac catheterization later this week if he remains stable.  3 recent CVA-neurology following.  They do not feel this was responsible for altered mental status.  4 cardiomyopathy-continue carvedilol.  He is not on an ACE inhibitor, ARB or Entresto due to renal insufficiency.  Continue hydralazine/nitrates.  5 chronic stage IV kidney disease-nephrology following.  6 Pseudomonas UTI-continue antibiotics.  7 hypertension-BP elevated; increase carvedilol to 18.75 mg twice daily and follow.   For questions or updates, please contact [place] Please consult [email or web address] for contact info under        Signed, Kirk Ruths, MD  11/17/2019, 7:17 AM

## 2019-11-17 NOTE — Progress Notes (Signed)
Pt wants to sleep and be left alone, not receptive to stroke education at this time or earlier today.

## 2019-11-17 NOTE — Progress Notes (Signed)
Pt did not eat breakfast this am, "not right now" when staff would ask him about eating.  Pt did take his morning medications this morning, did not want to eat then either.   Refused to work with therapy OT when they came, as well as ST when she came by.   Currently at this time 1227, lunch delivered to room, offered to set pt up to eat, touched pt on shoulder, pt responded " I done told you, don't touch me", this is the first time this nurse has heard pt state this.  Attempted to inform pt of this, encouraged him to let staff know when he was willing to take his medicine that was ordered to be given (potassium) and to eat his lunch, we could assist in getting it set up for him. No response at this time.

## 2019-11-17 NOTE — Progress Notes (Signed)
Patient ID: Dennis Macias, male   DOB: 03/31/1951, 69 y.o.   MRN: [id number] S: No events overnight. Is hypertensive-  Nonoliguric 925 of UOP  crt stable.  Very agitated this AM-  Still not eating well but cannot tell me why   O:BP (!) 179/73 (BP Location: Left Arm)   Pulse 63   Temp 98.1 F (36.7 C) (Oral)   Resp 13   Ht 6' (1.829 m)   Wt 65.9 kg   SpO2 98%   BMI 19.70 kg/m   Intake/Output Summary (Last 24 hours) at 11/17/2019 1023 Last data filed at 11/17/2019 0317 Gross per 24 hour  Intake 243 ml  Output 925 ml  Net -682 ml   Intake/Output: I/O last 3 completed shifts: In: 298 [P.O.:240; I.V.:58] Out: 2275 [Urine:2275]  Intake/Output this shift:  No intake/output data recorded. Weight change: -1.096 kg Gen: NAD-  Some confusion, difficult to keep on task  CVS: RRR, no rub Resp: cta Abd: +bs, soft, NT/nD Ext: no edema  Recent Labs  Lab 11/11/19 1444 11/11/19 1444 11/12/19 0354 11/12/19 1039 11/13/19 0419 11/14/19 0748 11/15/19 0336 11/16/19 0710 11/17/19 0248  NA 140   < > 141 143 144 142 141 143 142  K 4.0   < > 4.0 4.0 3.6 3.3* 3.1* 3.5 3.5  CL 113*   < > 117* 115* 111 106 106 106 105  CO2 16*   < > 16* 16* 18* 23 26 28 25   GLUCOSE 108*   < > 104* 108* 95 106* 123* 125* 111*  BUN 58*   < > 57* 56* 53* 46* 38* 38* 39*  CREATININE 3.88*   < > 3.90* 3.94* 3.65* 3.42* 3.34* 3.52* 3.56*  ALBUMIN 2.0*   < > 1.9* 1.9* 2.0* 2.0* 2.1* 2.0* 2.0*  CALCIUM 8.0*   < > 8.2* 8.5* 8.5* 8.3* 8.1* 8.5* 8.9  PHOS 4.0  --  4.0  --  3.7 3.2 2.8 2.9 3.1  AST  --   --   --  12*  --   --   --   --   --   ALT  --   --   --  10  --   --   --   --   --    < > = values in this interval not displayed.   Liver Function Tests: Recent Labs  Lab 11/12/19 1039 11/13/19 0419 11/15/19 0336 11/16/19 0710 11/17/19 0248  AST 12*  --   --   --   --   ALT 10  --   --   --   --   ALKPHOS 42  --   --   --   --   BILITOT 1.1  --   --   --   --   PROT 5.1*  --   --   --   --   ALBUMIN 1.9*    < > 2.1* 2.0* 2.0*   < > = values in this interval not displayed.   No results for input(s): LIPASE, AMYLASE in the last 168 hours. Recent Labs  Lab 11/13/19 0419  AMMONIA 18   CBC: Recent Labs  Lab 11/12/19 0354 11/12/19 0354 11/14/19 0748 11/14/19 0748 11/15/19 0336 11/16/19 0710 11/17/19 0248  WBC 4.9   < > 7.0   < > 7.9 6.1 6.8  HGB 8.2*   < > 7.6*   < > 8.4* 8.8* 9.2*  HCT 25.9*   < > 22.8*   < >  25.0* 26.9* 28.5*  MCV 91.8  --  88.0  --  87.7 89.1 90.2  PLT 218   < > 217   < > 230 259 281   < > = values in this interval not displayed.   Cardiac Enzymes: No results for input(s): CKTOTAL, CKMB, CKMBINDEX, TROPONINI in the last 168 hours. CBG: Recent Labs  Lab 11/11/19 1327 11/11/19 2110 11/12/19 0750 11/12/19 1655 11/14/19 0755  GLUCAP 92 99 97 97 101*    Iron Studies: No results for input(s): IRON, TIBC, TRANSFERRIN, FERRITIN in the last 72 hours. Studies/Results: No results found. Marland Kitchen amLODipine  5 mg Oral Daily  . atorvastatin  80 mg Oral QHS  . carvedilol  18.75 mg Oral BID WC  . clopidogrel  75 mg Oral Daily  . darbepoetin (ARANESP) injection - NON-DIALYSIS  40 mcg Subcutaneous Q Tue-1800  . dorzolamide-timolol  1 drop Right Eye BID  . ferrous gluconate  324 mg Oral Q breakfast  . finasteride  5 mg Oral Daily  . heparin  5,000 Units Subcutaneous Q8H  . hydrALAZINE  100 mg Oral TID  . isosorbide mononitrate  60 mg Oral Daily  . lidocaine  5 mL Intradermal Once  . mouth rinse  15 mL Mouth Rinse BID  . sodium chloride flush  3 mL Intravenous Q12H  . tamsulosin  0.4 mg Oral QPC supper  . vitamin B-12  1,000 mcg Oral Daily    BMET    Component Value Date/Time   NA 142 11/17/2019 0248   NA 140 08/13/2019 1139   K 3.5 11/17/2019 0248   CL 105 11/17/2019 0248   CO2 25 11/17/2019 0248   GLUCOSE 111 (H) 11/17/2019 0248   BUN 39 (H) 11/17/2019 0248   BUN 36 (H) 08/13/2019 1139   CREATININE 3.56 (H) 11/17/2019 0248   CALCIUM 8.9 11/17/2019 0248    GFRNONAA 17 (L) 11/17/2019 0248   GFRAA 19 (L) 11/17/2019 0248   CBC    Component Value Date/Time   WBC 6.8 11/17/2019 0248   RBC 3.16 (L) 11/17/2019 0248   HGB 9.2 (L) 11/17/2019 0248   HGB 7.7 (L) 08/13/2019 1139   HCT 28.5 (L) 11/17/2019 0248   HCT 22.3 (L) 08/13/2019 1139   PLT 281 11/17/2019 0248   PLT 392 08/13/2019 1139   MCV 90.2 11/17/2019 0248   MCV 86 08/13/2019 1139   MCH 29.1 11/17/2019 0248   MCHC 32.3 11/17/2019 0248   RDW 13.5 11/17/2019 0248   RDW 14.4 08/13/2019 1139   LYMPHSABS 1.1 11/06/2019 0935   LYMPHSABS 2.0 02/24/2018 1142   MONOABS 0.6 11/06/2019 0935   EOSABS 0.0 11/06/2019 0935   EOSABS 0.1 02/24/2018 1142   BASOSABS 0.0 11/06/2019 0935   BASOSABS 0.0 02/24/2018 1142    Assessment/Plan:  1. AMS- per neuro most likely due to cefepime related encephalopathy.  Changed to [facility] 11/12/19. 2. New Left parietal infarct seen on MRI - per Neuro, likely due to small vessel disease and not likely to have caused his AMS and speech issues.  Neuro following.  Holding off on LP for now. 3. AKI/CKD stage 4- following a syncopal episode.Baseline Scr appears to be 3.3-3.7 and was slowly improving with IVF's.  Scr has improved back to his baseline.This is not good but unclear if pt uremic.  No absolute needs for HD but FTT noted.  Will need to see if it declares itself one way or another 4. Syncope/NSTEMI- possible arrythmiaor could have been TIA. Cardiology  consulted and wantedto proceed with cardiac cath, however given recent stroke that is on hold.  Now planning on later in the week with contrast precautions 5. CKD stage 4- presumably due to DM and followed by Dr. Hollie Salk in our office and baseline Scr 3.3-3.7. is at baseline 6. HTN- increased hydralazine but BP remains elevated. On norvasc 10/coreg 12.5 BID- pulse 60's/hydralazine 100 TID Cont to followand lower slowly with recent ischemic stroke. 7. Fever-  Afebrile today. Urine culture + for pseudomonas  aeruginosa. Started on cefepime and now on ceftaz. 8. Anemia of CKD-started aranesp 11/10/19 at 40 mg-  May need increase , iron stores stable.  Louis Meckel  [employer] [phone]

## 2019-11-17 NOTE — Progress Notes (Signed)
Patient is getting irritable and impulsive, does not want anyone to touch him and started to increase his tone of voice.  He is following some commands but does not want anything being done to him.  Stated he wanted all these wires off.  Let patient know that he needs some wires to monitor his heart, I did take away his bp cuff wired off.   MD was paged regarding this incident and made aware of mood change throughout the day and not cooperating with staff.

## 2019-11-17 NOTE — Progress Notes (Addendum)
Subjective: The patient was seen at bedside this AM. His mental status has significantly improved over the past few days. He was alert to person and place, but not to year (the patient states that the year is 2001, but his son states that this is his baseline). He was somewhat irritated this morning, stating that he wishes that we hadn't woken him up this morning. He denies any complaints such as chest pain or SOB. There are no other complaints or concerns at this time.  Objective:  Vital signs in last 24 hours: Vitals:   11/17/19 0303 11/17/19 0453 11/17/19 0735 11/17/19 0738  BP: (!) 166/80 (!) 172/75 (!) 179/73   Pulse: 63  63   Resp: 14  13   Temp: 97.9 F (36.6 C)  98.1 F (36.7 C) 98.1 F (36.7 C)  TempSrc: Oral  Oral Oral  SpO2: 98%  98%   Weight: 65.9 kg     Height:       General: Middle aged male, no acute distress, lying in bed Cardiac: Regular rate and rhythm, no murmurs rubs or gallops Pulmonary: CTAB, no wheezing, rhonchi, rales Abdomen: Soft, nontender, nondistended, normoactive bowel sounds Neuro: Alert; oriented to person and place but not to year (his baseline per his son), following all commands, able to identify pen, cranial nerves II-XII grossly intact   Assessment/Plan: Dennis Macias is a 69 y.o. male with a history of CAD, chronic systolic and diastolic heart failure, CKD stage IV, anemia of chronic disease, and BPH with chronic indwelling Foley catheter status post TURP who presented with a syncopal episode that occurred on 11/06/19, now with acute onset confusion since 05/05, with recent MRI consistent with ischemic infarct of the left parietal region.   Active Problems:   History of stroke   Syncope and collapse   Syncope   Chronic combined systolic and diastolic heart failure (HCC)   Non-STEMI (non-ST elevated myocardial infarction) ([place])   Encephalopathies   Acute ischemic left ICA stroke (HCC)   Bacteremia   Hypertensive urgency   Gait  disorder   Ischemic Stroke Altered Mental Status CT noncontrast of the head performed on 05/05 showed no acute intracranial abnormality. On 05/06, the patient was evaluated by Cardiology, and a code stroke was called. This was called off as the patient's physical exam was reassuring. On 05/07, MRI showed an ischemic infarct in the left parietal region. No significant stenoses on carotid duplex US. Neurology is currently on board.  Patient's medications were adjusted on 5/06, cefepime was transitioned to ceftazidime and his bicarb was switched to a bicarb drip.  The patient has had significant improvement in his mental status since Saturday. He is alert, is able to name objects such as a pen, and follows all commands. Given the improvement his mental status changes, this may have been due to metabolic encephalopathy secondary to his cefepime use.  -Neurology following, appreciate recommendations -Continue holding cefepime -Continue Plavix, 75 mg PO qD  -PT/OT/SLP, PT and OT recommend CIR -Consider outpatient 30 day event monitor to evaluate for paroxysmal atrial fibrillation (echo from 1 month prior showing left atrial enlargement)    Syncope: Patient had an episode of syncope that occurred during breakfast on 04/30; no presyncopal times or seizure activity noted. Reported increase in his home BP medications on 04/29 and he had just taken his medications prior to the episode. Labs were significant for troponin elevation of 1600 > 2300 > 2200. Differential includes arrhythmia and CAD; cardiology is on  board and planning catheterization although his renal function makes this challenging given risk of contrast nephropathy.   -Cardiology following, appreciate recommendations -Cardiology recommends a left heart cath later this week if the patient remains stable; his mental status is improving, neurology does not feel punctate stroke would be a contraindication for cardiac cath -Continue  telemetry   UA showing pyuria; urine cultures positive for Pseudomonas: 1/4 blood cultures positive for Pseudomonas: Completed 7-day course of antibiotics, initially getting cefepime and then completed ceftazidime. Repeat urine culture showed multiple species present, suggest possible recollection. Will hold off for now, patient continues to be afebrile with no leukocytosis.    CKD stage IV: Patient has a baseline Cr ~3.5. His creatinine today is 3.56; his creatinine levels were around 3.4-4 early in his admission. Nephrology is currently following.   - Holding sodium bicarb due to concern of this contributing to his altered mental status - Nephrology consulted; appreciate their recs   Hypertension: Patient had his hydralazine and isosorbide medications increased at home before his syncopal episode. The patient remains hypertensive with most recent BP of 159/78 today. BP has recently been within our goal range of 160s-180s; permissive HTN in the setting of recent ischemic stroke. We will continue antihypertensive regimen with imdur, 60 mg PO qd, coreg 18.75 mg bid, hydral100 tid, amlodipine 5 mg po qd, isordil 40mg  tid for a goal SBP of 160s-180s.   -Continue BP regimen as above.   Chronic systolic and diastolic heart failure: BNP on admission elevated to>4000. Was on Lasix initially, but this was recently discontinued given recently climbing Scr levels. Euvolemic on exam. Creatinine today ~3.5.  -Holding home Lasix -Continue beta blocker regimen -Strict I's/O's -Daily weights   Anemia of chronic disease: -Continue ferrous gluconate -Aranesp per nephrology   Orvis Brill, Medical Student 11/17/2019, 11:01 AM Pager: [phone]

## 2019-11-18 LAB — RENAL FUNCTION PANEL
Albumin: 2 g/dL — ABNORMAL LOW (ref 3.5–5.0)
Anion gap: 8 (ref 5–15)
BUN: 39 mg/dL — ABNORMAL HIGH (ref 8–23)
CO2: 24 mmol/L (ref 22–32)
Calcium: 8.6 mg/dL — ABNORMAL LOW (ref 8.9–10.3)
Chloride: 109 mmol/L (ref 98–111)
Creatinine, Ser: 3.88 mg/dL — ABNORMAL HIGH (ref 0.61–1.24)
GFR calc Af Amer: 17 mL/min — ABNORMAL LOW (ref >60)
GFR calc non Af Amer: 15 mL/min — ABNORMAL LOW (ref >60)
Glucose, Bld: 96 mg/dL (ref 70–99)
Phosphorus: 3.4 mg/dL (ref 2.5–4.6)
Potassium: 3.9 mmol/L (ref 3.5–5.1)
Sodium: 141 mmol/L (ref 135–145)

## 2019-11-18 LAB — CBC
HCT: 28.9 % — ABNORMAL LOW (ref 39.0–52.0)
Hemoglobin: 9.3 g/dL — ABNORMAL LOW (ref 13.0–17.0)
MCH: 29.2 pg (ref 26.0–34.0)
MCHC: 32.2 g/dL (ref 30.0–36.0)
MCV: 90.9 fL (ref 80.0–100.0)
Platelets: 283 10*3/uL (ref 150–400)
RBC: 3.18 MIL/uL — ABNORMAL LOW (ref 4.22–5.81)
RDW: 14 % (ref 11.5–15.5)
WBC: 6.3 10*3/uL (ref 4.0–10.5)
nRBC: 0 % (ref 0.0–0.2)

## 2019-11-18 LAB — MAGNESIUM: Magnesium: 1.7 mg/dL (ref 1.7–2.4)

## 2019-11-18 MED ORDER — SODIUM CHLORIDE 0.9 % WEIGHT BASED INFUSION
1.0000 mL/kg/h | INTRAVENOUS | Status: DC
  Administered 2019-11-18 – 2019-11-19 (×2): 1 mL/kg/h via INTRAVENOUS

## 2019-11-18 MED ORDER — ASPIRIN 81 MG PO CHEW
81.0000 mg | CHEWABLE_TABLET | ORAL | Status: AC
  Administered 2019-11-19: 81 mg via ORAL

## 2019-11-18 MED ORDER — ASPIRIN 81 MG PO CHEW
81.0000 mg | CHEWABLE_TABLET | Freq: Every day | ORAL | Status: DC
  Administered 2019-11-18 – 2019-11-25 (×7): 81 mg via ORAL
  Filled 2019-11-18 (×8): qty 1

## 2019-11-18 MED ORDER — CARVEDILOL 25 MG PO TABS
25.0000 mg | ORAL_TABLET | Freq: Two times a day (BID) | ORAL | Status: DC
  Administered 2019-11-18 – 2019-11-25 (×16): 25 mg via ORAL
  Filled 2019-11-18 (×16): qty 1

## 2019-11-18 MED ORDER — SODIUM CHLORIDE 0.9% FLUSH: 3.0000 mL | INTRAVENOUS | Status: DC | PRN

## 2019-11-18 MED ORDER — MAGNESIUM SULFATE 2 GM/50ML IV SOLN
2.0000 g | Freq: Once | INTRAVENOUS | Status: AC
  Administered 2019-11-18: 2 g via INTRAVENOUS
  Filled 2019-11-18: qty 50

## 2019-11-18 MED ORDER — SODIUM CHLORIDE 0.9 % IV SOLN: 250.0000 mL | INTRAVENOUS | Status: DC | PRN

## 2019-11-18 MED ORDER — SODIUM CHLORIDE 0.9% FLUSH
3.0000 mL | Freq: Two times a day (BID) | INTRAVENOUS | Status: DC
  Administered 2019-11-18 – 2019-11-25 (×10): 3 mL via INTRAVENOUS

## 2019-11-18 NOTE — Progress Notes (Signed)
Patient's daughter [name]) called on update.  I gave update to patient's daughter and will f/up tomorrow after his procedure.

## 2019-11-18 NOTE — Progress Notes (Signed)
Patient had bleeding episode on his left anterior thigh.  This was discover while nurse was doing assessment.  It looked like he was bleeding for a while, bed pad sheet, gown on left side and top sheet were saturated.  Placed pressure on site and placed gauze with pressure tape.    Patient not c/o of sob, pain, or weakness.  MD has been notified.

## 2019-11-18 NOTE — Progress Notes (Signed)
OT Cancellation Note  Patient Details Name: Dennis Macias MRN: [id number] DOB: December 31, 1950   Cancelled Treatment:    Reason Eval/Treat Not Completed: Patient declined, no reason specified. Attempted to see pt this afternoon for OT. Pt declined stating "I don't want to." Educated pt on the importance of daily activity and the purpose of therapy with pt continuing to refuse. OT will follow up at a later date as time allows.   Mauri Brooklyn 11/18/2019, 2:48 PM

## 2019-11-18 NOTE — Progress Notes (Signed)
Patient ID: Dennis Macias, male   DOB: [birth year], 69 y.o.   MRN: [id number]    S: UOP not well recorded.   still agitated this AM-  Still not eating well but cannot tell me why-  crt up some but difficult to tell if uremic-  Planning for heart cath tomorrow   O:BP 136/72   Pulse 66   Temp 98.4 F (36.9 C) (Oral)   Resp 13   Ht 6' (1.829 m)   Wt 64.7 kg   SpO2 100%   BMI 19.35 kg/m   Intake/Output Summary (Last 24 hours) at 11/18/2019 1316 Last data filed at 11/18/2019 1007 Gross per 24 hour  Intake 486 ml  Output --  Net 486 ml   Intake/Output: I/O last 3 completed shifts: In: 723 [P.O.:720; I.V.:3] Out: 400 [Urine:400]  Intake/Output this shift:  Total I/O In: 6 [I.V.:6] Out: -  Weight change: -1.2 kg Gen: NAD-  Some confusion, difficult to keep on task  CVS: RRR, no rub Resp: cta Abd: +bs, soft, NT/nD Ext: no edema  Recent Labs  Lab 11/12/19 0354 11/12/19 0354 11/12/19 1039 11/13/19 0419 11/14/19 0748 11/15/19 0336 11/16/19 0710 11/17/19 0248 11/18/19 0501  NA 141   < > 143 144 142 141 143 142 141  K 4.0   < > 4.0 3.6 3.3* 3.1* 3.5 3.5 3.9  CL 117*   < > 115* 111 106 106 106 105 109  CO2 16*   < > 16* 18* 23 26 28 25 24   GLUCOSE 104*   < > 108* 95 106* 123* 125* 111* 96  BUN 57*   < > 56* 53* 46* 38* 38* 39* 39*  CREATININE 3.90*   < > 3.94* 3.65* 3.42* 3.34* 3.52* 3.56* 3.88*  ALBUMIN 1.9*   < > 1.9* 2.0* 2.0* 2.1* 2.0* 2.0* 2.0*  CALCIUM 8.2*   < > 8.5* 8.5* 8.3* 8.1* 8.5* 8.9 8.6*  PHOS 4.0  --   --  3.7 3.2 2.8 2.9 3.1 3.4  AST  --   --  12*  --   --   --   --   --   --   ALT  --   --  10  --   --   --   --   --   --    < > = values in this interval not displayed.   Liver Function Tests: Recent Labs  Lab 11/12/19 1039 11/13/19 0419 11/16/19 0710 11/17/19 0248 11/18/19 0501  AST 12*  --   --   --   --   ALT 10  --   --   --   --   ALKPHOS 42  --   --   --   --   BILITOT 1.1  --   --   --   --   PROT 5.1*  --   --   --   --   ALBUMIN 1.9*   <  > 2.0* 2.0* 2.0*   < > = values in this interval not displayed.   No results for input(s): LIPASE, AMYLASE in the last 168 hours. Recent Labs  Lab 11/13/19 0419  AMMONIA 18   CBC: Recent Labs  Lab 11/14/19 0748 11/14/19 0748 11/15/19 0336 11/15/19 0336 11/16/19 0710 11/17/19 0248 11/18/19 0501  WBC 7.0   < > 7.9   < > 6.1 6.8 6.3  HGB 7.6*   < > 8.4*   < > 8.8* 9.2* 9.3*  HCT 22.8*   < > 25.0*   < > 26.9* 28.5* 28.9*  MCV 88.0  --  87.7  --  89.1 90.2 90.9  PLT 217   < > 230   < > 259 281 283   < > = values in this interval not displayed.   Cardiac Enzymes: No results for input(s): CKTOTAL, CKMB, CKMBINDEX, TROPONINI in the last 168 hours. CBG: Recent Labs  Lab 11/11/19 1327 11/11/19 2110 11/12/19 0750 11/12/19 1655 11/14/19 0755  GLUCAP 92 99 97 97 101*    Iron Studies: No results for input(s): IRON, TIBC, TRANSFERRIN, FERRITIN in the last 72 hours. Studies/Results: No results found. Marland Kitchen amLODipine  5 mg Oral Daily  . aspirin  81 mg Oral Daily  . [START ON 11/19/2019] aspirin  81 mg Oral Pre-Cath  . atorvastatin  80 mg Oral QHS  . carvedilol  25 mg Oral BID WC  . clopidogrel  75 mg Oral Daily  . darbepoetin (ARANESP) injection - NON-DIALYSIS  40 mcg Subcutaneous Q Tue-1800  . dorzolamide-timolol  1 drop Right Eye BID  . ferrous gluconate  324 mg Oral Q breakfast  . finasteride  5 mg Oral Daily  . heparin  5,000 Units Subcutaneous Q8H  . hydrALAZINE  100 mg Oral TID  . isosorbide mononitrate  60 mg Oral Daily  . lidocaine  5 mL Intradermal Once  . mouth rinse  15 mL Mouth Rinse BID  . sodium chloride flush  3 mL Intravenous Q12H  . sodium chloride flush  3 mL Intravenous Q12H  . tamsulosin  0.4 mg Oral QPC supper  . vitamin B-12  1,000 mcg Oral Daily    BMET    Component Value Date/Time   NA 141 11/18/2019 0501   NA 140 08/13/2019 1139   K 3.9 11/18/2019 0501   CL 109 11/18/2019 0501   CO2 24 11/18/2019 0501   GLUCOSE 96 11/18/2019 0501   BUN 39  (H) 11/18/2019 0501   BUN 36 (H) 08/13/2019 1139   CREATININE 3.88 (H) 11/18/2019 0501   CALCIUM 8.6 (L) 11/18/2019 0501   GFRNONAA 15 (L) 11/18/2019 0501   GFRAA 17 (L) 11/18/2019 0501   CBC    Component Value Date/Time   WBC 6.3 11/18/2019 0501   RBC 3.18 (L) 11/18/2019 0501   HGB 9.3 (L) 11/18/2019 0501   HGB 7.7 (L) 08/13/2019 1139   HCT 28.9 (L) 11/18/2019 0501   HCT 22.3 (L) 08/13/2019 1139   PLT 283 11/18/2019 0501   PLT 392 08/13/2019 1139   MCV 90.9 11/18/2019 0501   MCV 86 08/13/2019 1139   MCH 29.2 11/18/2019 0501   MCHC 32.2 11/18/2019 0501   RDW 14.0 11/18/2019 0501   RDW 14.4 08/13/2019 1139   LYMPHSABS 1.1 11/06/2019 0935   LYMPHSABS 2.0 02/24/2018 1142   MONOABS 0.6 11/06/2019 0935   EOSABS 0.0 11/06/2019 0935   EOSABS 0.1 02/24/2018 1142   BASOSABS 0.0 11/06/2019 0935   BASOSABS 0.0 02/24/2018 1142    Assessment/Plan:  1. AMS- per neuro most likely due to cefepime related encephalopathy.  Changed to [facility] 11/12/19. Is cranky-  Not sure of baseline 2. New Left parietal infarct seen on MRI - per Neuro, likely due to small vessel disease and not likely to have caused his AMS and speech issues.  Neuro following.  Holding off on LP for now. 3. AKI/CKD stage 4- following a syncopal episode.Baseline Scr appears to be 3.3-3.7 and was slowly improving with IVF's.  Scr has improved to his poor baseline.This is not good but unclear if pt uremic.  No absolute needs for HD but FTT noted.  Will need to see if it declares itself one way or another.  He seems to be upset regarding being in the hospital. I really dont think he would like the rules and dietary restrictions associated with a lift on dialysis.  May want to have palliative care visit of goals of care 4. Syncope/NSTEMI- possible arrythmiaor could have been TIA. Cardiology consulted and wantedto proceed with cardiac cath, however given recent stroke that is on hold.  Now planning on later in the week with  contrast precautions 5. CKD stage 4- presumably due to DM and followed by Dr. Hollie Salk in our office and baseline Scr 3.3-3.7. is at baseline 6. HTN- increased hydralazine but BP remains elevated. On norvasc 10/coreg 12.5 BID- pulse 60's/hydralazine 100 TID Cont to followand lower slowly with recent ischemic stroke. No changes today  7. Fever-  Afebrile. Urine culture + for pseudomonas aeruginosa. abx complete 8. Anemia of CKD-started aranesp 11/10/19 at 40 mg-  May need increase , iron stores stable.  Louis Meckel  [employer] [phone]

## 2019-11-18 NOTE — Progress Notes (Addendum)
Recheck patient's left leg anterior site, no additional bleeding.

## 2019-11-18 NOTE — Progress Notes (Signed)
Subjective: The patient was seen at bedside this AM. His mental status has significantly improved over the past several days. He was somewhat irritated this morning, stating that he wishes to eat and drink more. He denies any complaints such as CP, SOB, dysphagia, or coughing. There are no other complaints or concerns at this time.  Objective:  Vital signs in last 24 hours: Vitals:   11/18/19 0812 11/18/19 0954 11/18/19 0955 11/18/19 1011  BP: (!) 169/77 (!) 189/82  136/72  Pulse: 66  66   Resp: 19   13  Temp: 98.4 F (36.9 C)     TempSrc: Oral     SpO2: 100%     Weight:      Height:       General: Middle aged male, no acute distress, lying in bed Cardiac: Regular rate and rhythm, no murmurs rubs or gallops Pulmonary: CTAB, no wheezing, rhonchi, rales Abdomen: Soft, nontender, nondistended, normoactive bowel sounds Neuro: AAOx3, follows all commands cranial nerves II-XII grossly intact   Assessment/Plan: Dennis Macias is a 69 y.o. male with a history of CAD, chronic systolic and diastolic heart failure, CKD stage IV, anemia of chronic disease, and BPH with chronic indwelling Foley catheter status post TURP who presented with a syncopal episode that occurred on 11/06/19, now with acute onset confusion since 05/05, with recent MRI consistent with ischemic infarct of the left parietal region.   Active Problems:   History of stroke   Syncope and collapse   Syncope   Chronic combined systolic and diastolic heart failure (HCC)   Non-STEMI (non-ST elevated myocardial infarction) ([place])   Encephalopathies   Acute ischemic left ICA stroke (HCC)   Bacteremia   Hypertensive urgency   Gait disorder   Ischemic Stroke Altered Mental Status CT noncontrast of the head performed on 05/05 showed no acute intracranial abnormality. On 05/06, the patient was evaluated by Cardiology, and a code stroke was called. This was called off as the patient's physical exam was reassuring. On 05/07, MRI  showed an ischemic infarct in the left parietal region. No significant stenoses on carotid duplex US. Neurology is currently on board.  Patient's medications were adjusted on 5/06, cefepime was transitioned to ceftazidime and his bicarb was switched to a bicarb drip.  The patient has had significant improvement in his mental status since Saturday. AAOx3 and follows all commands today. Given the improvement his mental status changes, this may have been due to metabolic encephalopathy secondary to his cefepime use.  -Neurology following, appreciate recommendations -Continue holding cefepime -Continue Plavix, 75 mg PO qD  -PT/OT/SLP, PT and OT recommend CIR -Consider outpatient 30 day event monitor to evaluate for paroxysmal atrial fibrillation (echo from 1 month prior showing left atrial enlargement)    Syncope: Patient had an episode of syncope that occurred during breakfast on 04/30; no presyncopal times or seizure activity noted. Reported increase in his home BP medications on 04/29 and he had just taken his medications prior to the episode. Labs were significant for troponin elevation of 1600 > 2300 > 2200. Differential includes arrhythmia and CAD; cardiology is on board and planning catheterization although his renal function makes this challenging given risk of contrast nephropathy.   -Cardiology following, appreciate recommendations -Cardiology recommends a left heart cath tomorrow given improvement in his mental status. Neurology does not feel punctate stroke would be a contraindication for cardiac cath. -IV NS bolus prior to cardiac cath tomorrow -Administered 2g magnesium sulfate due to magnesium level of 1.7  today. -Continue telemetry   UA showing pyuria; urine cultures positive for Pseudomonas: 1/4 blood cultures positive for Pseudomonas: Completed 7-day course of antibiotics, initially getting cefepime and then completed ceftazidime. Repeat urine culture showed multiple species  present, suggest possible recollection. Will hold off for now, patient continues to be afebrile with no leukocytosis.    CKD stage IV: Patient has a baseline Cr ~3.5. His creatinine today is 3.88. Nephrology is currently following.   - Nephrology consulted; appreciate their recs - Holding sodium bicarb due to concern of this contributing to his altered mental status   Hypertension: Patient had his hydralazine and isosorbide medications increased at home before his syncopal episode. The patient remains hypertensive with most recent BP of 166/73 today. BP has recently been within our goal range of 160s-180s; permissive HTN in the setting of recent ischemic stroke. We will continue antihypertensive regimen with imdur, 60 mg PO qd, coreg 25 mg bid, hydral100 tid, amlodipine 5 mg po qd for a goal SBP of 160s-180s.   -Continue BP regimen as above.   Chronic systolic and diastolic heart failure: BNP on admission elevated to>4000. Was on Lasix initially, but this was recently discontinued given recently climbing Scr levels. Euvolemic on exam. Creatinine today ~3.5.  -Holding home Lasix -Continue beta blocker regimen -Strict I's/O's -Daily weights   Anemia of chronic disease: -Continue ferrous gluconate -Aranesp per nephrology  FEN/GI - NPO at midnight   Orvis Brill, Medical Student 11/18/2019, 10:49 AM Pager: [phone]

## 2019-11-18 NOTE — Progress Notes (Signed)
PT Cancellation Note  Patient Details Name: Dennis Macias MRN: [id number] DOB: [birth year]   Cancelled Treatment:    Reason Eval/Treat Not Completed: Other (comment)(Adamantly refused.  Pt stated "I am going to sleep.")   Denice Paradise 11/18/2019, 10:53 AM Nilton Lave W,PT Acute Rehabilitation Services Pager:  [phone]  Office:  [phone]

## 2019-11-18 NOTE — Progress Notes (Signed)
  Speech Language Pathology Treatment: Dysphagia  Patient Details Name: Dennis Macias MRN: [id number] DOB: Dec 27, 1950 Today's Date: 11/18/2019 Time: 3748-2707 SLP Time Calculation (min) (ACUTE ONLY): 8 min  Assessment / Plan / Recommendation Clinical Impression  Pt was seen for dysphagia treatment with his son present and was moderately cooperative throughout the session with encouragement to accept trials. Verbal output was notably improved and pt's family indicated that his language skills are back to baseline. Pt, nursing, and his son reported that the pt has been tolerating the current diet without overt s/sx of aspiration but does not enjoy the puree diet. Pt tolerated regular texture solids, mixed consistency boluses, and thin liquids via straw using consecutive swallows without symptoms of oropharyngeal dysphagia. Mastication was adequate despite edentulous status and pt reported that he typically consumes a regular texture diet without dentures. A diet upgrade to regular texture solids with thin liquids is clinically indicated. Further skilled SLP services are not clinically indicated at this time.    HPI HPI: Pt is a 69 y.o. male with a PMHx of CAD, ischemic cardiomyopathy, chronic systolic and diastolic heart failure, CKD stage IV, anemia of chronic disease, and BPH with chronic indwelling foley catheter s/p TURP 10/20/19 who presents here today after a syncopal episode on 4/30. Gradual decrease in his mentation and level of alertness started on 5/6. CT head 5/6: 10 mm acute infarct within the left parietal lobe subcortical white matter. MRA: No proximal intracranial vessel occlusion or new stenosis.      SLP Plan  All goals met       Recommendations  Diet recommendations: Regular;Thin liquid Liquids provided via: Straw Medication Administration: Whole meds with liquid Supervision: Patient able to self feed Postural Changes and/or Swallow Maneuvers: Seated upright 90 degrees                Oral Care Recommendations: Oral care BID;Staff/trained caregiver to provide oral care Follow up Recommendations: None SLP Visit Diagnosis: Dysphagia, unspecified (R13.10) Plan: All goals met       Marlita Keil I. Hardin Negus, [place], [place] Office number [phone] Pager [place] 11/18/2019, 1:42 PM

## 2019-11-18 NOTE — Progress Notes (Signed)
Progress Note  Patient Name: Dennis Macias Date of Encounter: 11/18/2019  Primary Cardiologist: Larae Grooms, MD   Subjective   Irritable; denies CP or dyspnea  Inpatient Medications    Scheduled Meds: . amLODipine  5 mg Oral Daily  . atorvastatin  80 mg Oral QHS  . carvedilol  18.75 mg Oral BID WC  . clopidogrel  75 mg Oral Daily  . darbepoetin (ARANESP) injection - NON-DIALYSIS  40 mcg Subcutaneous Q Tue-1800  . dorzolamide-timolol  1 drop Right Eye BID  . ferrous gluconate  324 mg Oral Q breakfast  . finasteride  5 mg Oral Daily  . heparin  5,000 Units Subcutaneous Q8H  . hydrALAZINE  100 mg Oral TID  . isosorbide mononitrate  60 mg Oral Daily  . lidocaine  5 mL Intradermal Once  . mouth rinse  15 mL Mouth Rinse BID  . sodium chloride flush  3 mL Intravenous Q12H  . tamsulosin  0.4 mg Oral QPC supper  . vitamin B-12  1,000 mcg Oral Daily   Continuous Infusions: . sodium chloride    . sodium chloride     PRN Meds: sodium chloride, acetaminophen **OR** acetaminophen, hydrALAZINE, sodium chloride flush   Vital Signs    Vitals:   11/18/19 0024 11/18/19 0425 11/18/19 0500 11/18/19 0812  BP: (!) 166/73 (!) 152/69  (!) 169/77  Pulse: 63 60  66  Resp: 17 16  19   Temp: 98 F (36.7 C) 98.2 F (36.8 C)  98.4 F (36.9 C)  TempSrc: Oral Oral  Oral  SpO2: 97% 100%  100%  Weight:   64.7 kg   Height:        Intake/Output Summary (Last 24 hours) at 11/18/2019 0815 Last data filed at 11/17/2019 2102 Gross per 24 hour  Intake 480 ml  Output -  Net 480 ml   Last 3 Weights 11/18/2019 11/17/2019 11/16/2019  Weight (lbs) 142 lb 10.2 oz 145 lb 4.5 oz 147 lb 11.2 oz  Weight (kg) 64.7 kg 65.9 kg 66.996 kg      Telemetry    Sinus- Personally Reviewed   Physical Exam   GEN: NAD Neck: supple Cardiac: RRR, no murmur Respiratory: CTA; no wheeze GI: Soft, NT/ND, no masses MS: No edema Neuro:  No focal findings Psych: Irritable  Labs    High Sensitivity  Troponin:   Recent Labs  Lab 11/06/19 0935 11/06/19 1924 11/06/19 2235  TROPONINIHS 1,618* 2,384* 2,233*      Chemistry Recent Labs  Lab 11/12/19 1039 11/13/19 0419 11/16/19 0710 11/17/19 0248 11/18/19 0501  NA 143   < > 143 142 141  K 4.0   < > 3.5 3.5 3.9  CL 115*   < > 106 105 109  CO2 16*   < > 28 25 24   GLUCOSE 108*   < > 125* 111* 96  BUN 56*   < > 38* 39* 39*  CREATININE 3.94*   < > 3.52* 3.56* 3.88*  CALCIUM 8.5*   < > 8.5* 8.9 8.6*  PROT 5.1*  --   --   --   --   ALBUMIN 1.9*   < > 2.0* 2.0* 2.0*  AST 12*  --   --   --   --   ALT 10  --   --   --   --   ALKPHOS 42  --   --   --   --   BILITOT 1.1  --   --   --   --  GFRNONAA 15*   < > 17* 17* 15*  GFRAA 17*   < > 19* 19* 17*  ANIONGAP 12   < > 9 12 8    < > = values in this interval not displayed.     Hematology Recent Labs  Lab 11/16/19 0710 11/17/19 0248 11/18/19 0501  WBC 6.1 6.8 6.3  RBC 3.02* 3.16* 3.18*  HGB 8.8* 9.2* 9.3*  HCT 26.9* 28.5* 28.9*  MCV 89.1 90.2 90.9  MCH 29.1 29.1 29.2  MCHC 32.7 32.3 32.2  RDW 13.3 13.5 14.0  PLT 259 281 283    Patient Profile     69 y.o.malewith past medical history of coronary artery disease, ischemic cardiomyopathy, hypertension, hyperlipidemia, chronic stage IV kidney disease, chronic indwelling Foley catheter, history of CVA for evaluation of syncope. Echocardiogram April 2021 showed ejection fraction 40 to 00%, grade 1 diastolic dysfunction, severe left atrial enlargement, mild mitral regurgitation. Nuclear study April 2021 showed ejection fraction 40%. There was apical infarction with peri-infarct ischemia. Patient had been treated medically due to renal insufficiency and lack of symptoms.  Plan following syncopal episode was for cardiac catheterization but patient developed altered mental status, CVA and fever.  Assessment & Plan    1 non-ST elevation myocardial infarction-Continue aspirin, statin and carvedilol.  Patient has had a complicated  hospital course after being admitted with syncope.  He had encephalopathy that was felt secondary to cefepime, small CVA and Pseudomonas UTI.  However I think he is relatively high risk from a cardiac standpoint given history of ischemic cardiomyopathy, nonsustained ventricular tachycardia noted on telemetry and syncope.  We will therefore plan to proceed with cardiac catheterization tomorrow.  I have again explained the risks including myocardial infarction, CVA, death and contrast nephropathy/dialysis.  He agrees to proceed.  We will hydrate prior to the procedure, no ventriculogram and limit dye.  Follow renal function closely after procedure.  2 syncope-etiology unclear.  Given history of cardiomyopathy, ischemia on previous nuclear study and NSVT noted on telemetry I remain concerned about possibility of ventricular arrhythmia.  We will proceed with cardiac catheterization tomorrow.  3 recent CVA-neurology following.  They do not feel this was responsible for altered mental status and no contraindication to cardiac catheterization.  4 cardiomyopathy-continue carvedilol (increase to 25 mg BID).  He is not on an ACE inhibitor, ARB or Entresto due to renal insufficiency.  Continue hydralazine/nitrates.  5 chronic stage IV kidney disease-nephrology following.  As above we will hydrate prior to cardiac catheterization and follow renal function closely after procedure.  6 Pseudomonas UTI-continue antibiotics.  7 hypertension-BP elevated; increase carvedilol to 25 mg twice daily and follow.   For questions or updates, please contact [place] Please consult [email or web address] for contact info under        Signed, Kirk Ruths, MD  11/18/2019, 8:15 AM

## 2019-11-18 NOTE — Consult Note (Signed)
   [facility] CM Inpatient Consult   11/18/2019  Dennis Macias 1950-12-25 [id number]   [facility] ACO Patient:  [facility] PPO   Patient screened for extreme high risk score for less than 30 days for unplanned readmission and for 5 hospitalizations in the past 6 months.  Attempts for [place] Management team follow up was that [facility] was unable to establish contact as noted in EMR chart review.    Review of patient's medical record reveals patient is now a member in the [place] practice at [facility] clinic.   Primary Care Provider is Rylee Christain, MD, this office is listed to provide the [facility] follow up and patient could benefit from their Chronic Care Management program. Patient is for a possible CIR admission if tolerating therapy per protocol and insurance approval is needed and noted.  Plan:  Continue to follow progress and disposition to assess for post hospital care management needs and will make a referral to the [facility] Chronic Care Management team as necessary.    Please place a [facility] Care Management consult as appropriate and for questions contact:   Natividad Brood, RN BSN [facility] Liaison  [phone] business mobile phone Toll free office [phone]  Fax number: [phone] [email or web address]

## 2019-11-19 ENCOUNTER — Encounter (HOSPITAL_COMMUNITY)
Admission: EM | Disposition: A | Discharge status: Rehabilitation Facility | Payer: Self-pay | Source: Home / Self Care | Attending: Internal Medicine

## 2019-11-19 DIAGNOSIS — I429 Cardiomyopathy, unspecified: Secondary | ICD-10-CM

## 2019-11-19 DIAGNOSIS — E43 Unspecified severe protein-calorie malnutrition: Secondary | ICD-10-CM | POA: Insufficient documentation

## 2019-11-19 HISTORY — PX: LEFT HEART CATH AND CORONARY ANGIOGRAPHY: CATH118249

## 2019-11-19 LAB — RENAL FUNCTION PANEL
Albumin: 1.9 g/dL — ABNORMAL LOW (ref 3.5–5.0)
Anion gap: 11 (ref 5–15)
BUN: 40 mg/dL — ABNORMAL HIGH (ref 8–23)
CO2: 22 mmol/L (ref 22–32)
Calcium: 8.3 mg/dL — ABNORMAL LOW (ref 8.9–10.3)
Chloride: 109 mmol/L (ref 98–111)
Creatinine, Ser: 3.88 mg/dL — ABNORMAL HIGH (ref 0.61–1.24)
GFR calc Af Amer: 17 mL/min — ABNORMAL LOW (ref >60)
GFR calc non Af Amer: 15 mL/min — ABNORMAL LOW (ref >60)
Glucose, Bld: 97 mg/dL (ref 70–99)
Phosphorus: 3.5 mg/dL (ref 2.5–4.6)
Potassium: 3.8 mmol/L (ref 3.5–5.1)
Sodium: 142 mmol/L (ref 135–145)

## 2019-11-19 LAB — CBC
HCT: 26.3 % — ABNORMAL LOW (ref 39.0–52.0)
Hemoglobin: 8.3 g/dL — ABNORMAL LOW (ref 13.0–17.0)
MCH: 29 pg (ref 26.0–34.0)
MCHC: 31.6 g/dL (ref 30.0–36.0)
MCV: 92 fL (ref 80.0–100.0)
Platelets: 250 10*3/uL (ref 150–400)
RBC: 2.86 MIL/uL — ABNORMAL LOW (ref 4.22–5.81)
RDW: 14.2 % (ref 11.5–15.5)
WBC: 5.5 10*3/uL (ref 4.0–10.5)
nRBC: 0 % (ref 0.0–0.2)

## 2019-11-19 LAB — TYPE AND SCREEN
ABO/RH(D): AB POS
Antibody Screen: NEGATIVE

## 2019-11-19 SURGERY — LEFT HEART CATH AND CORONARY ANGIOGRAPHY
Anesthesia: LOCAL

## 2019-11-19 MED ORDER — MIDAZOLAM HCL 2 MG/2ML IJ SOLN
INTRAMUSCULAR | Status: DC | PRN
  Administered 2019-11-19: 1 mg via INTRAVENOUS

## 2019-11-19 MED ORDER — HEPARIN (PORCINE) IN NACL 1000-0.9 UT/500ML-% IV SOLN
INTRAVENOUS | Status: AC
  Filled 2019-11-19: qty 1000

## 2019-11-19 MED ORDER — IOHEXOL 350 MG/ML SOLN
INTRAVENOUS | Status: DC | PRN
  Administered 2019-11-19: 20 mL

## 2019-11-19 MED ORDER — FENTANYL CITRATE (PF) 100 MCG/2ML IJ SOLN
INTRAMUSCULAR | Status: AC
  Filled 2019-11-19: qty 2

## 2019-11-19 MED ORDER — SODIUM CHLORIDE 0.9 % IV SOLN: INTRAVENOUS | Status: AC

## 2019-11-19 MED ORDER — HEPARIN (PORCINE) IN NACL 1000-0.9 UT/500ML-% IV SOLN
INTRAVENOUS | Status: DC | PRN
  Administered 2019-11-19 (×2): 500 mL

## 2019-11-19 MED ORDER — ENSURE ENLIVE PO LIQD
237.0000 mL | Freq: Three times a day (TID) | ORAL | Status: DC
  Administered 2019-11-19 – 2019-11-25 (×16): 237 mL via ORAL

## 2019-11-19 MED ORDER — SODIUM CHLORIDE 0.9% FLUSH
3.0000 mL | Freq: Two times a day (BID) | INTRAVENOUS | Status: DC
  Administered 2019-11-19 – 2019-11-24 (×9): 3 mL via INTRAVENOUS

## 2019-11-19 MED ORDER — SODIUM CHLORIDE 0.9 % IV SOLN
INTRAVENOUS | Status: AC | PRN
  Administered 2019-11-19: 250 mL via INTRAVENOUS

## 2019-11-19 MED ORDER — MIDAZOLAM HCL 2 MG/2ML IJ SOLN
INTRAMUSCULAR | Status: AC
  Filled 2019-11-19: qty 2

## 2019-11-19 MED ORDER — FENTANYL CITRATE (PF) 100 MCG/2ML IJ SOLN
INTRAMUSCULAR | Status: DC | PRN
  Administered 2019-11-19: 25 ug via INTRAVENOUS

## 2019-11-19 MED ORDER — ONDANSETRON HCL 4 MG/2ML IJ SOLN: 4.0000 mg | Freq: Four times a day (QID) | INTRAMUSCULAR | Status: DC | PRN

## 2019-11-19 MED ORDER — LIDOCAINE HCL (PF) 1 % IJ SOLN
INTRAMUSCULAR | Status: DC | PRN
  Administered 2019-11-19: 10 mL

## 2019-11-19 MED ORDER — SODIUM CHLORIDE 0.9% FLUSH: 3.0000 mL | INTRAVENOUS | Status: DC | PRN

## 2019-11-19 MED ORDER — SODIUM CHLORIDE 0.9 % IV SOLN: 250.0000 mL | INTRAVENOUS | Status: DC | PRN

## 2019-11-19 MED ORDER — LABETALOL HCL 5 MG/ML IV SOLN: 10.0000 mg | INTRAVENOUS | Status: AC | PRN

## 2019-11-19 MED ORDER — LIDOCAINE HCL (PF) 1 % IJ SOLN
INTRAMUSCULAR | Status: AC
  Filled 2019-11-19: qty 30

## 2019-11-19 MED ORDER — HYDRALAZINE HCL 20 MG/ML IJ SOLN: 10.0000 mg | INTRAMUSCULAR | Status: AC | PRN

## 2019-11-19 SURGICAL SUPPLY — 8 items
CATH INFINITI 5FR MULTPACK ANG (CATHETERS) ×1 IMPLANT
KIT HEART LEFT (KITS) ×2 IMPLANT
PACK CARDIAC CATHETERIZATION (CUSTOM PROCEDURE TRAY) ×2 IMPLANT
SHEATH PINNACLE 5F 10CM (SHEATH) ×1 IMPLANT
SHEATH PROBE COVER 6X72 (BAG) ×1 IMPLANT
TRANSDUCER W/STOPCOCK (MISCELLANEOUS) ×2 IMPLANT
TUBING CIL FLEX 10 FLL-RA (TUBING) ×2 IMPLANT
WIRE EMERALD 3MM-J .035X150CM (WIRE) ×1 IMPLANT

## 2019-11-19 NOTE — Progress Notes (Signed)
Initial Nutrition Assessment  DOCUMENTATION CODES:   Severe malnutrition in context of chronic illness, Underweight  INTERVENTION:   Ensure Enlive po TID, each supplement provides 350 kcal and 20 grams of protein.  Recommend liberalize diet to regular.  NUTRITION DIAGNOSIS:   Severe Malnutrition related to chronic illness(CKD, HF) as evidenced by energy intake < or equal to 75% for > or equal to 1 month, percent weight loss(25% weight loss within 3 months).  GOAL:   Patient will meet greater than or equal to 90% of their needs  MONITOR:   PO intake, Supplement acceptance, Labs  REASON FOR ASSESSMENT:   LOS    ASSESSMENT:   69 yo male admitted 4/30 with syncopal episode. Acute onset of confusion since 11/11/19. MRI revealed new L parietal infarct. PMH includes CAD, HF, CKD-IV, DM, HTN, HLD, tachycardia, blind R eye, CVA, anemia.   Recent poor intake related at least partially to AMS.   Labs reviewed. BUN 40 (H), creat 3.88 (H), K 3.8 WNL, Phos 3.5 WNL CBG's: none recorded since 5/8  Medications reviewed and include aranesp, fergon, flomax, vitamin B-12.  Weight encounters reviewed. Patient has had 25% weight loss within the past 3 months, 17% weight loss within the past month, both significant for the time frame.   Since admission (2 weeks), intake has been 0-25% of meals. Average meal completion for the past 8 documented meals is 13%. Even if intake was good PTA, overall intake would still be meeting <75% of estimated energy requirement for > 1 month.  Patient meets criteria for severe malnutrition in the context of chronic illness.  NUTRITION - FOCUSED PHYSICAL EXAM:  unable to complete  Diet Order:   Diet Order            Diet Carb Modified Fluid consistency: Thin; Room service appropriate? Yes  Diet effective now              EDUCATION NEEDS:   Not appropriate for education at this time  Skin:  Skin Assessment: Reviewed RN Assessment  Last BM:   5/11  Height:   Ht Readings from Last 1 Encounters:  11/06/19 6' (1.829 m)    Weight:   Wt Readings from Last 1 Encounters:  11/19/19 61 kg    Ideal Body Weight:  80.9 kg  BMI:  Body mass index is 18.24 kg/m.  Estimated Nutritional Needs:   Kcal:  1900-2100  Protein:  85-100 gm  Fluid:  1.8 L    Lucas Mallow, RD, LDN, CNSC Please refer to Amion for contact information.

## 2019-11-19 NOTE — Progress Notes (Signed)
Inpatient Rehabilitation-Admissions Coordinator   Following pt progress. Note pt has refused to participate in therapy on 5/11 and 5/12. Will need to see a greater willingness to participate in acute therapies before we can consider him for CIR. If his participation does not improve, would recommend SNF.   Raechel Ache, OTR/L  Rehab Admissions Coordinator  [phone] 11/19/2019 1:49 PM

## 2019-11-19 NOTE — Progress Notes (Signed)
Subjective: The patient was seen at bedside this afternoon. He underwent LHC by cardiology this morning which was uneventful. He had an episode of bleeding from his left anterior thigh overnight but does not complain of any pain or issues; he states he is "not sure where it came from." He has no new complaints or concerns at this time.  Objective:  Vital signs in last 24 hours: Vitals:   11/19/19 1105 11/19/19 1110 11/19/19 1115 11/19/19 1144  BP: 139/65 (!) 141/56 (!) 153/57 127/61  Pulse: (!) 58 (!) 57 (!) 56   Resp: 13 15 13 14   Temp:    97.7 F (36.5 C)  TempSrc:    Oral  SpO2: 97% 99% 99%   Weight:      Height:       General: Middle aged male, no acute distress, lying in bed Cardiac: Regular rate and rhythm, no murmurs rubs or gallops Pulmonary: CTAB, no wheezing, rhonchi, rales Abdomen: Soft, nontender, nondistended, normoactive bowel sounds Neuro: AAOx3, follows all commands cranial nerves II-XII grossly intact Skin: Left anterior thigh wound with overlying gauze, c/d/i   Assessment/Plan: Dennis Macias is a 69 y.o. male with a history of CAD, chronic systolic and diastolic heart failure, CKD stage IV, anemia of chronic disease, and BPH with chronic indwelling Foley catheter status post TURP who presented with a syncopal episode that occurred on 11/06/19, now with acute onset confusion since 05/05, with recent MRI consistent with ischemic infarct of the left parietal region.   Active Problems:   History of stroke   Syncope and collapse   Syncope   Chronic combined systolic and diastolic heart failure (HCC)   Non-STEMI (non-ST elevated myocardial infarction) ([place])   Encephalopathies   Acute ischemic left ICA stroke (HCC)   Bacteremia   Hypertensive urgency   Gait disorder   Ischemic Stroke Altered Mental Status CT noncontrast of the head performed on 05/05 showed no acute intracranial abnormality. On 05/06, the patient was evaluated by Cardiology, and a code stroke  was called. This was called off as the patient's physical exam was reassuring. On 05/07, MRI showed an ischemic infarct in the left parietal region. No significant stenoses on carotid duplex US. Neurology is on board. Patient's medications were adjusted on 5/06, cefepime was transitioned to ceftazidime and his bicarb was switched to a bicarb drip.  The patient has had significant improvement in his mental status since last Saturday. AAOx3 and follows all commands today. Given the improvement his mental status changes, this may have been due to metabolic encephalopathy secondary to his cefepime use.  -Neurology following, appreciate recommendations -Continue holding cefepime -Continue Plavix, 75 mg PO qD   -PT/OT/SLP, PT and OT recommending CIR -Consider outpatient 30 day event monitor to evaluate for paroxysmal atrial fibrillation (echo from 1 month prior showing left atrial enlargement)    Syncope: Patient had an episode of syncope that occurred during breakfast on 04/30; no presyncopal times or seizure activity noted. Reported increase in his home BP medications on 04/29 and he had just taken his medications prior to the episode. Labs were significant for troponin elevation of 1600 > 2300 > 2200. Differential includes arrhythmia and CAD.  -Cardiology following, appreciate recommendations -S/p LHC by cardiology today. -Continue telemetry   UA showing pyuria; urine cultures positive for Pseudomonas: 1/4 blood cultures positive for Pseudomonas: Completed 7-day course of antibiotics, initially getting cefepime and then completed ceftazidime. Repeat urine culture showed multiple species present, suggest possible recollection. Will hold off  for now, patient continues to be afebrile with no leukocytosis.    CKD stage IV: Patient has a baseline Cr ~3.5. His creatinine today is 3.88. Nephrology is currently following.   - Nephrology consulted; appreciate their recs - Holding sodium bicarb due to  concern of this contributing to his altered mental status   Hypertension: Patient had his hydralazine and isosorbide medications increased at home before his syncopal episode. The patient remains hypertensive with most recent BP of 166/73 today. BP has recently been within our goal range of 160s-180s; permissive HTN in the setting of recent ischemic stroke. We will continue antihypertensive regimen with imdur, 60 mg PO qd, coreg 25 mg bid, hydral100 tid, amlodipine 5 mg po qd for a goal SBP of 160s-180s. Will slowly begin to titrate to lower BP.  -Continue BP regimen as above.   Chronic systolic and diastolic heart failure: BNP on admission elevated to>4000. Was on Lasix initially, but this was recently discontinued given recently climbing Scr levels. Euvolemic on exam. Creatinine today ~3.88.  -Holding home Lasix -Continue beta blocker regimen -Strict I's/O's -Daily weights    Anemia of chronic disease: -Continue ferrous gluconate -Aranesp per nephrology    Dennis Macias, Medical Student 11/19/2019, 1:08 PM Pager: [phone]

## 2019-11-19 NOTE — H&P (View-Only) (Signed)
Progress Note  Patient Name: Dennis Macias Date of Encounter: 11/19/2019  Primary Cardiologist: Larae Grooms, MD   Subjective   Pt denies CP or dyspnea  Inpatient Medications    Scheduled Meds: . amLODipine  5 mg Oral Daily  . aspirin  81 mg Oral Daily  . atorvastatin  80 mg Oral QHS  . carvedilol  25 mg Oral BID WC  . clopidogrel  75 mg Oral Daily  . darbepoetin (ARANESP) injection - NON-DIALYSIS  40 mcg Subcutaneous Q Tue-1800  . dorzolamide-timolol  1 drop Right Eye BID  . ferrous gluconate  324 mg Oral Q breakfast  . finasteride  5 mg Oral Daily  . heparin  5,000 Units Subcutaneous Q8H  . hydrALAZINE  100 mg Oral TID  . isosorbide mononitrate  60 mg Oral Daily  . lidocaine  5 mL Intradermal Once  . mouth rinse  15 mL Mouth Rinse BID  . sodium chloride flush  3 mL Intravenous Q12H  . sodium chloride flush  3 mL Intravenous Q12H  . tamsulosin  0.4 mg Oral QPC supper  . vitamin B-12  1,000 mcg Oral Daily   Continuous Infusions: . sodium chloride    . sodium chloride    . sodium chloride    . sodium chloride 1 mL/kg/hr (11/18/19 2027)   PRN Meds: sodium chloride, sodium chloride, acetaminophen **OR** acetaminophen, hydrALAZINE, sodium chloride flush, sodium chloride flush   Vital Signs    Vitals:   11/18/19 2025 11/18/19 2041 11/19/19 0102 11/19/19 0427  BP: (!) 144/64 (!) 144/64 136/68 (!) 159/68  Pulse: (!) 59  (!) 59 61  Resp: 12  12 16   Temp: 98.2 F (36.8 C)  99.1 F (37.3 C) 98.4 F (36.9 C)  TempSrc: Oral  Oral Oral  SpO2: 98%  97% 97%  Weight:    61 kg  Height:        Intake/Output Summary (Last 24 hours) at 11/19/2019 0800 Last data filed at 11/19/2019 0555 Gross per 24 hour  Intake 609 ml  Output 950 ml  Net -341 ml   Last 3 Weights 11/19/2019 11/18/2019 11/17/2019  Weight (lbs) 134 lb 7.7 oz 142 lb 10.2 oz 145 lb 4.5 oz  Weight (kg) 61 kg 64.7 kg 65.9 kg      Telemetry    Sinus- Personally Reviewed   Physical Exam   GEN: NAD WD  WN Neck: supple, no JVD Cardiac: RRR Respiratory: CTA; no rhonchi GI: Soft, No tenderness MS: No edema Neuro:  Grossly intact Psych: Normal  Labs    High Sensitivity Troponin:   Recent Labs  Lab 11/06/19 0935 11/06/19 1924 11/06/19 2235  TROPONINIHS 1,618* 2,384* 2,233*      Chemistry Recent Labs  Lab 11/12/19 1039 11/13/19 0419 11/16/19 0710 11/17/19 0248 11/18/19 0501  NA 143   < > 143 142 141  K 4.0   < > 3.5 3.5 3.9  CL 115*   < > 106 105 109  CO2 16*   < > 28 25 24   GLUCOSE 108*   < > 125* 111* 96  BUN 56*   < > 38* 39* 39*  CREATININE 3.94*   < > 3.52* 3.56* 3.88*  CALCIUM 8.5*   < > 8.5* 8.9 8.6*  PROT 5.1*  --   --   --   --   ALBUMIN 1.9*   < > 2.0* 2.0* 2.0*  AST 12*  --   --   --   --  ALT 10  --   --   --   --   ALKPHOS 42  --   --   --   --   BILITOT 1.1  --   --   --   --   GFRNONAA 15*   < > 17* 17* 15*  GFRAA 17*   < > 19* 19* 17*  ANIONGAP 12   < > 9 12 8    < > = values in this interval not displayed.     Hematology Recent Labs  Lab 11/16/19 0710 11/17/19 0248 11/18/19 0501  WBC 6.1 6.8 6.3  RBC 3.02* 3.16* 3.18*  HGB 8.8* 9.2* 9.3*  HCT 26.9* 28.5* 28.9*  MCV 89.1 90.2 90.9  MCH 29.1 29.1 29.2  MCHC 32.7 32.3 32.2  RDW 13.3 13.5 14.0  PLT 259 281 283    Patient Profile     69 y.o.malewith past medical history of coronary artery disease, ischemic cardiomyopathy, hypertension, hyperlipidemia, chronic stage IV kidney disease, chronic indwelling Foley catheter, history of CVA for evaluation of syncope. Echocardiogram April 2021 showed ejection fraction 40 to 19%, grade 1 diastolic dysfunction, severe left atrial enlargement, mild mitral regurgitation. Nuclear study April 2021 showed ejection fraction 40%. There was apical infarction with peri-infarct ischemia. Patient had been treated medically due to renal insufficiency and lack of symptoms.  Plan following syncopal episode was for cardiac catheterization but patient developed  altered mental status, CVA and fever.  Assessment & Plan    1 non-ST elevation myocardial infarction-Continue aspirin, statin and carvedilol.  Patient has had a complicated hospital course after being admitted with syncope.  He had encephalopathy that was felt secondary to cefepime, small CVA and Pseudomonas UTI.  However I think he is relatively high risk from a cardiac standpoint given history of ischemic cardiomyopathy, nonsustained ventricular tachycardia noted on telemetry and syncope.  We will therefore plan to proceed with cardiac catheterization today (risks and benefits including myocardial infarction, CVA, death, contrast nephropathy including dialysis discussed and patient agrees to proceed).  Patient has been hydrated and we will limit dye.  Follow renal function closely after procedure.  2 syncope-etiology unclear.  Given history of cardiomyopathy, ischemia on previous nuclear study and NSVT noted on telemetry I remain concerned about possibility of ventricular arrhythmia.  Plan cardiac catheterization as outlined.  3 recent CVA-neurology following.  They do not feel this was responsible for altered mental status and no contraindication to cardiac catheterization.  4 cardiomyopathy-continue carvedilol and hydralazine/nitrates.  He is not on an ACE inhibitor, ARB or Entresto due to renal insufficiency.    5 chronic stage IV kidney disease-nephrology following.  As above we will hydrate prior to cardiac catheterization and follow renal function closely after procedure.  6 Pseudomonas UTI-continue antibiotics.  7 hypertension-BP improving.  Continue present medications and follow.   For questions or updates, please contact [place] Please consult [email or web address] for contact info under        Signed, Kirk Ruths, MD  11/19/2019, 8:00 AM

## 2019-11-19 NOTE — Progress Notes (Signed)
Progress Note  Patient Name: Dennis Macias Date of Encounter: 11/19/2019  Primary Cardiologist: Larae Grooms, MD   Subjective   Pt denies CP or dyspnea  Inpatient Medications    Scheduled Meds: . amLODipine  5 mg Oral Daily  . aspirin  81 mg Oral Daily  . atorvastatin  80 mg Oral QHS  . carvedilol  25 mg Oral BID WC  . clopidogrel  75 mg Oral Daily  . darbepoetin (ARANESP) injection - NON-DIALYSIS  40 mcg Subcutaneous Q Tue-1800  . dorzolamide-timolol  1 drop Right Eye BID  . ferrous gluconate  324 mg Oral Q breakfast  . finasteride  5 mg Oral Daily  . heparin  5,000 Units Subcutaneous Q8H  . hydrALAZINE  100 mg Oral TID  . isosorbide mononitrate  60 mg Oral Daily  . lidocaine  5 mL Intradermal Once  . mouth rinse  15 mL Mouth Rinse BID  . sodium chloride flush  3 mL Intravenous Q12H  . sodium chloride flush  3 mL Intravenous Q12H  . tamsulosin  0.4 mg Oral QPC supper  . vitamin B-12  1,000 mcg Oral Daily   Continuous Infusions: . sodium chloride    . sodium chloride    . sodium chloride    . sodium chloride 1 mL/kg/hr (11/18/19 2027)   PRN Meds: sodium chloride, sodium chloride, acetaminophen **OR** acetaminophen, hydrALAZINE, sodium chloride flush, sodium chloride flush   Vital Signs    Vitals:   11/18/19 2025 11/18/19 2041 11/19/19 0102 11/19/19 0427  BP: (!) 144/64 (!) 144/64 136/68 (!) 159/68  Pulse: (!) 59  (!) 59 61  Resp: 12  12 16   Temp: 98.2 F (36.8 C)  99.1 F (37.3 C) 98.4 F (36.9 C)  TempSrc: Oral  Oral Oral  SpO2: 98%  97% 97%  Weight:    61 kg  Height:        Intake/Output Summary (Last 24 hours) at 11/19/2019 0800 Last data filed at 11/19/2019 0555 Gross per 24 hour  Intake 609 ml  Output 950 ml  Net -341 ml   Last 3 Weights 11/19/2019 11/18/2019 11/17/2019  Weight (lbs) 134 lb 7.7 oz 142 lb 10.2 oz 145 lb 4.5 oz  Weight (kg) 61 kg 64.7 kg 65.9 kg      Telemetry    Sinus- Personally Reviewed   Physical Exam   GEN: NAD WD  WN Neck: supple, no JVD Cardiac: RRR Respiratory: CTA; no rhonchi GI: Soft, No tenderness MS: No edema Neuro:  Grossly intact Psych: Normal  Labs    High Sensitivity Troponin:   Recent Labs  Lab 11/06/19 0935 11/06/19 1924 11/06/19 2235  TROPONINIHS 1,618* 2,384* 2,233*      Chemistry Recent Labs  Lab 11/12/19 1039 11/13/19 0419 11/16/19 0710 11/17/19 0248 11/18/19 0501  NA 143   < > 143 142 141  K 4.0   < > 3.5 3.5 3.9  CL 115*   < > 106 105 109  CO2 16*   < > 28 25 24   GLUCOSE 108*   < > 125* 111* 96  BUN 56*   < > 38* 39* 39*  CREATININE 3.94*   < > 3.52* 3.56* 3.88*  CALCIUM 8.5*   < > 8.5* 8.9 8.6*  PROT 5.1*  --   --   --   --   ALBUMIN 1.9*   < > 2.0* 2.0* 2.0*  AST 12*  --   --   --   --  ALT 10  --   --   --   --   ALKPHOS 42  --   --   --   --   BILITOT 1.1  --   --   --   --   GFRNONAA 15*   < > 17* 17* 15*  GFRAA 17*   < > 19* 19* 17*  ANIONGAP 12   < > 9 12 8    < > = values in this interval not displayed.     Hematology Recent Labs  Lab 11/16/19 0710 11/17/19 0248 11/18/19 0501  WBC 6.1 6.8 6.3  RBC 3.02* 3.16* 3.18*  HGB 8.8* 9.2* 9.3*  HCT 26.9* 28.5* 28.9*  MCV 89.1 90.2 90.9  MCH 29.1 29.1 29.2  MCHC 32.7 32.3 32.2  RDW 13.3 13.5 14.0  PLT 259 281 283    Patient Profile     69 y.o.malewith past medical history of coronary artery disease, ischemic cardiomyopathy, hypertension, hyperlipidemia, chronic stage IV kidney disease, chronic indwelling Foley catheter, history of CVA for evaluation of syncope. Echocardiogram April 2021 showed ejection fraction 40 to 29%, grade 1 diastolic dysfunction, severe left atrial enlargement, mild mitral regurgitation. Nuclear study April 2021 showed ejection fraction 40%. There was apical infarction with peri-infarct ischemia. Patient had been treated medically due to renal insufficiency and lack of symptoms.  Plan following syncopal episode was for cardiac catheterization but patient developed  altered mental status, CVA and fever.  Assessment & Plan    1 non-ST elevation myocardial infarction-Continue aspirin, statin and carvedilol.  Patient has had a complicated hospital course after being admitted with syncope.  He had encephalopathy that was felt secondary to cefepime, small CVA and Pseudomonas UTI.  However I think he is relatively high risk from a cardiac standpoint given history of ischemic cardiomyopathy, nonsustained ventricular tachycardia noted on telemetry and syncope.  We will therefore plan to proceed with cardiac catheterization today (risks and benefits including myocardial infarction, CVA, death, contrast nephropathy including dialysis discussed and patient agrees to proceed).  Patient has been hydrated and we will limit dye.  Follow renal function closely after procedure.  2 syncope-etiology unclear.  Given history of cardiomyopathy, ischemia on previous nuclear study and NSVT noted on telemetry I remain concerned about possibility of ventricular arrhythmia.  Plan cardiac catheterization as outlined.  3 recent CVA-neurology following.  They do not feel this was responsible for altered mental status and no contraindication to cardiac catheterization.  4 cardiomyopathy-continue carvedilol and hydralazine/nitrates.  He is not on an ACE inhibitor, ARB or Entresto due to renal insufficiency.    5 chronic stage IV kidney disease-nephrology following.  As above we will hydrate prior to cardiac catheterization and follow renal function closely after procedure.  6 Pseudomonas UTI-continue antibiotics.  7 hypertension-BP improving.  Continue present medications and follow.   For questions or updates, please contact [place] Please consult [email or web address] for contact info under        Signed, Kirk Ruths, MD  11/19/2019, 8:00 AM

## 2019-11-19 NOTE — Progress Notes (Signed)
Patient ID: Dennis Macias, male   DOB: 27-Jun-1951, 69 y.o.   MRN: [id number]    S: UOP not well recorded- at least 950.    Still not eating well -  crt stable but not great-  Planning for heart cath today - he is a little more engaged today   O:BP (!) 150/66   Pulse (!) 59   Temp 98.2 F (36.8 C) (Oral)   Resp 16   Ht 6' (1.829 m)   Wt 61 kg   SpO2 98%   BMI 18.24 kg/m   Intake/Output Summary (Last 24 hours) at 11/19/2019 0927 Last data filed at 11/19/2019 0820 Gross per 24 hour  Intake 612 ml  Output 950 ml  Net -338 ml   Intake/Output: I/O last 3 completed shifts: In: 7412 [P.O.:1320; I.V.:9] Out: 950 [Urine:950]  Intake/Output this shift:  Total I/O In: 3 [I.V.:3] Out: -  Weight change: -3.7 kg Gen: NAD-  More alert today-  Wants some socks CVS: RRR, no rub Resp: cta Abd: +bs, soft, NT/nD Ext: no edema  Recent Labs  Lab 11/12/19 1039 11/12/19 1039 11/13/19 0419 11/14/19 0748 11/15/19 0336 11/16/19 0710 11/17/19 0248 11/18/19 0501 11/19/19 0817  NA 143   < > 144 142 141 143 142 141 142  K 4.0   < > 3.6 3.3* 3.1* 3.5 3.5 3.9 3.8  CL 115*   < > 111 106 106 106 105 109 109  CO2 16*   < > 18* 23 26 28 25 24 22   GLUCOSE 108*   < > 95 106* 123* 125* 111* 96 97  BUN 56*   < > 53* 46* 38* 38* 39* 39* 40*  CREATININE 3.94*   < > 3.65* 3.42* 3.34* 3.52* 3.56* 3.88* 3.88*  ALBUMIN 1.9*   < > 2.0* 2.0* 2.1* 2.0* 2.0* 2.0* 1.9*  CALCIUM 8.5*   < > 8.5* 8.3* 8.1* 8.5* 8.9 8.6* 8.3*  PHOS  --   --  3.7 3.2 2.8 2.9 3.1 3.4 3.5  AST 12*  --   --   --   --   --   --   --   --   ALT 10  --   --   --   --   --   --   --   --    < > = values in this interval not displayed.   Liver Function Tests: Recent Labs  Lab 11/12/19 1039 11/13/19 0419 11/17/19 0248 11/18/19 0501 11/19/19 0817  AST 12*  --   --   --   --   ALT 10  --   --   --   --   ALKPHOS 42  --   --   --   --   BILITOT 1.1  --   --   --   --   PROT 5.1*  --   --   --   --   ALBUMIN 1.9*   < > 2.0* 2.0* 1.9*    < > = values in this interval not displayed.   No results for input(s): LIPASE, AMYLASE in the last 168 hours. Recent Labs  Lab 11/13/19 0419  AMMONIA 18   CBC: Recent Labs  Lab 11/15/19 0336 11/15/19 0336 11/16/19 0710 11/16/19 0710 11/17/19 0248 11/18/19 0501 11/19/19 0817  WBC 7.9   < > 6.1   < > 6.8 6.3 5.5  HGB 8.4*   < > 8.8*   < > 9.2* 9.3*  8.3*  HCT 25.0*   < > 26.9*   < > 28.5* 28.9* 26.3*  MCV 87.7  --  89.1  --  90.2 90.9 92.0  PLT 230   < > 259   < > 281 283 250   < > = values in this interval not displayed.   Cardiac Enzymes: No results for input(s): CKTOTAL, CKMB, CKMBINDEX, TROPONINI in the last 168 hours. CBG: Recent Labs  Lab 11/12/19 1655 11/14/19 0755  GLUCAP 97 101*    Iron Studies: No results for input(s): IRON, TIBC, TRANSFERRIN, FERRITIN in the last 72 hours. Studies/Results: No results found. Marland Kitchen amLODipine  5 mg Oral Daily  . aspirin  81 mg Oral Daily  . atorvastatin  80 mg Oral QHS  . carvedilol  25 mg Oral BID WC  . clopidogrel  75 mg Oral Daily  . darbepoetin (ARANESP) injection - NON-DIALYSIS  40 mcg Subcutaneous Q Tue-1800  . dorzolamide-timolol  1 drop Right Eye BID  . ferrous gluconate  324 mg Oral Q breakfast  . finasteride  5 mg Oral Daily  . heparin  5,000 Units Subcutaneous Q8H  . hydrALAZINE  100 mg Oral TID  . isosorbide mononitrate  60 mg Oral Daily  . lidocaine  5 mL Intradermal Once  . mouth rinse  15 mL Mouth Rinse BID  . sodium chloride flush  3 mL Intravenous Q12H  . sodium chloride flush  3 mL Intravenous Q12H  . tamsulosin  0.4 mg Oral QPC supper  . vitamin B-12  1,000 mcg Oral Daily    BMET    Component Value Date/Time   NA 142 11/19/2019 0817   NA 140 08/13/2019 1139   K 3.8 11/19/2019 0817   CL 109 11/19/2019 0817   CO2 22 11/19/2019 0817   GLUCOSE 97 11/19/2019 0817   BUN 40 (H) 11/19/2019 0817   BUN 36 (H) 08/13/2019 1139   CREATININE 3.88 (H) 11/19/2019 0817   CALCIUM 8.3 (L) 11/19/2019 0817    GFRNONAA 15 (L) 11/19/2019 0817   GFRAA 17 (L) 11/19/2019 0817   CBC    Component Value Date/Time   WBC 5.5 11/19/2019 0817   RBC 2.86 (L) 11/19/2019 0817   HGB 8.3 (L) 11/19/2019 0817   HGB 7.7 (L) 08/13/2019 1139   HCT 26.3 (L) 11/19/2019 0817   HCT 22.3 (L) 08/13/2019 1139   PLT 250 11/19/2019 0817   PLT 392 08/13/2019 1139   MCV 92.0 11/19/2019 0817   MCV 86 08/13/2019 1139   MCH 29.0 11/19/2019 0817   MCHC 31.6 11/19/2019 0817   RDW 14.2 11/19/2019 0817   RDW 14.4 08/13/2019 1139   LYMPHSABS 1.1 11/06/2019 0935   LYMPHSABS 2.0 02/24/2018 1142   MONOABS 0.6 11/06/2019 0935   EOSABS 0.0 11/06/2019 0935   EOSABS 0.1 02/24/2018 1142   BASOSABS 0.0 11/06/2019 0935   BASOSABS 0.0 02/24/2018 1142    Assessment/Plan:  1. AMS- per neuro most likely due to cefepime related encephalopathy.  Changed to [facility] 11/12/19. Is better 2. New Left parietal infarct seen on MRI - per Neuro, likely due to small vessel disease and not likely to have caused his AMS and speech issues.  Neuro following.  Holding off on LP for now. 3. AKI/CKD stage 4- following a syncopal episode.Baseline Scr appears to be 3.3-3.7 and was slowly improving with IVF's.  Scr has improved to his poor baseline.This is not good but unclear if pt uremic.  No absolute needs for HD but  FTT noted.  Will need to see if it declares itself one way or another.  He seems to be upset regarding being in the hospital. I really dont think he would like the rules and dietary restrictions associated with a life on dialysis.  May want to have palliative care visit of goals of care 4. Syncope/NSTEMI- possible arrythmiaor could have been TIA. Cardiology consulted and wantedto proceed with cardiac cath, however given recent stroke that is on hold.  Now planning on today with precautions 5. CKD stage 4- presumably due to DM and followed by Dr. Hollie Salk in our office and baseline Scr 3.3-3.7. is at baseline 6. HTN- increased hydralazine but BP  remains elevated. On norvasc 10/coreg 12.5 BID- pulse 60's/hydralazine 100 TID Cont to followand lower slowly with recent ischemic stroke. No changes today  7. Fever-  Afebrile. Urine culture + for pseudomonas aeruginosa. abx complete 8. Anemia of CKD-started aranesp 11/10/19 at 40 mg-  May need increase , iron stores OK.  Louis Meckel  [employer] [phone]

## 2019-11-19 NOTE — Progress Notes (Signed)
Site: right femoral Sheath Size: 20fr Condition prior to removal:  Level 0 Type of pressure held: manual pressure held Time pressure held: 5 Status of patient during pull: stable Condition of site post pull:  Level 0 Type of dressing applied: gauze w/ transparent Pulses verified: right dorsalis doppler Patient's condition post pull: stable Bedrest begins at: 1115 Post instructions given to patient: yes

## 2019-11-19 NOTE — Interval H&P Note (Signed)
Cath Lab Visit (complete for each Cath Lab visit)  Clinical Evaluation Leading to the Procedure:   ACS: Yes.    Non-ACS:    Anginal Classification: CCS IV  Anti-ischemic medical therapy: Minimal Therapy (1 class of medications)  Non-Invasive Test Results: No non-invasive testing performed  Prior CABG: No previous CABG      History and Physical Interval Note:  11/19/2019 10:02 AM  Dennis Macias  has presented today for surgery, with the diagnosis of chest pain.  The various methods of treatment have been discussed with the patient and family. After consideration of risks, benefits and other options for treatment, the patient has consented to  Procedure(s): LEFT HEART CATH AND CORONARY ANGIOGRAPHY (N/A) as a surgical intervention.  The patient's history has been reviewed, patient examined, no change in status, stable for surgery.  I have reviewed the patient's chart and labs.  Questions were answered to the patient's satisfaction.     Larae Grooms

## 2019-11-19 NOTE — Progress Notes (Signed)
PT Cancellation Note  Patient Details Name: Dennis Macias MRN: [id number] DOB: 03-14-1951   Cancelled Treatment:    Reason Eval/Treat Not Completed: Patient at procedure or test/unavailable(Pt in CATH lab. Will return tomorrow. )   [place] 11/19/2019, 10:03 AM  Eleina Jergens W,PT Acute Rehabilitation Services Pager:  [phone]  Office:  [phone]

## 2019-11-20 ENCOUNTER — Telehealth: Payer: Self-pay | Admitting: Physician Assistant

## 2019-11-20 LAB — MAGNESIUM: Magnesium: 2.1 mg/dL (ref 1.7–2.4)

## 2019-11-20 LAB — RENAL FUNCTION PANEL
Albumin: 1.9 g/dL — ABNORMAL LOW (ref 3.5–5.0)
Anion gap: 8 (ref 5–15)
BUN: 41 mg/dL — ABNORMAL HIGH (ref 8–23)
CO2: 22 mmol/L (ref 22–32)
Calcium: 8.3 mg/dL — ABNORMAL LOW (ref 8.9–10.3)
Chloride: 111 mmol/L (ref 98–111)
Creatinine, Ser: 4.01 mg/dL — ABNORMAL HIGH (ref 0.61–1.24)
GFR calc Af Amer: 17 mL/min — ABNORMAL LOW (ref >60)
GFR calc non Af Amer: 14 mL/min — ABNORMAL LOW (ref >60)
Glucose, Bld: 166 mg/dL — ABNORMAL HIGH (ref 70–99)
Phosphorus: 3.7 mg/dL (ref 2.5–4.6)
Potassium: 4 mmol/L (ref 3.5–5.1)
Sodium: 141 mmol/L (ref 135–145)

## 2019-11-20 NOTE — Progress Notes (Signed)
Subjective: The patient was seen at bedside this afternoon. He underwent LHC by cardiology yesterday which was uneventful. He states that the food here has not been appetizing and has therefore had trouble eating since he came here. He has no new complaints or concerns at this time.  Objective:  Vital signs in last 24 hours: Vitals:   11/20/19 0000 11/20/19 0411 11/20/19 0749 11/20/19 1241  BP: (!) 145/64 136/69 (!) 142/69 128/63  Pulse: (!) 59 (!) 58 86 (!) 58  Resp:  15 14 15   Temp:  98.4 F (36.9 C) 97.7 F (36.5 C) 98 F (36.7 C)  TempSrc:  Oral Oral Oral  SpO2:  97% 100% 100%  Weight:  72.4 kg    Height:       General: Middle aged male, no acute distress, lying in bed Cardiac: Regular rate and rhythm, no murmurs rubs or gallops Pulmonary: CTAB, no wheezing, rhonchi, rales Abdomen: Soft, nontender, nondistended, normoactive bowel sounds Neuro: AAOx3, follows all commands cranial nerves II-XII grossly intact Skin: Left anterior thigh wound with overlying gauze, c/d/i   Left heart cath, 11/19/19:  Mid LAD lesion is 75% stenosed.  Ost LAD to Prox LAD lesion is 80% stenosed. In the cranial views, it is seen that the disease extends up to the ostial LAD, and there is significant calcification.  LV end diastolic pressure is normal.  There is no aortic valve stenosis. PCI of the proximal - ostial LAD would likely require IVUS with possible atherectomy.  There is a mid to distal LAD lesion which is eccentric.   Given his lack of angina and issues with renal insufficiency, would not pursue PCI at this time.  Would reconsider based on his renal function.      Assessment/Plan: Dennis Macias is a 69 y.o. male with a history of CAD, chronic systolic and diastolic heart failure, CKD stage IV, anemia of chronic disease, and BPH with chronic indwelling Foley catheter status post TURP who presented with a syncopal episode that occurred on 11/06/19, now with acute onset confusion since  05/05, with recent MRI consistent with ischemic infarct of the left parietal region.   Active Problems:   History of stroke   Syncope and collapse   Syncope   Chronic combined systolic and diastolic heart failure (HCC)   Non-STEMI (non-ST elevated myocardial infarction) ([place])   Encephalopathies   Acute ischemic left ICA stroke (HCC)   Bacteremia   Hypertensive urgency   Gait disorder   Protein-calorie malnutrition, severe   Ischemic Stroke Altered Mental Status CT noncontrast of the head performed on 05/05 showed no acute intracranial abnormality. On 05/06, the patient was evaluated by Cardiology, and a code stroke was called. This was called off as the patient's physical exam was reassuring. On 05/07, MRI showed an ischemic infarct in the left parietal region. No significant stenoses on carotid duplex US. Neurology is on board. Patient's medications were adjusted on 5/06, cefepime was transitioned to ceftazidime and his bicarb was switched to a bicarb drip.  The patient has had significant improvement in his mental status since last Saturday. AAOx3 and follows all commands today. Given the improvement his mental status changes, this may have been due to metabolic encephalopathy secondary to his cefepime use. -Neurology following, appreciate recommendations -Continue holding cefepime -Continue Plavix, 75 mg PO qD   -PT/OT/SLP, PT and OT recommending CIR versus SNF. Patient will need more willingness to participate in rehab or would otherwise qualify for SNF rather than CIR. -Consider  outpatient 30 day event monitor to evaluate for paroxysmal atrial fibrillation (echo from 1 month prior showing left atrial enlargement)   Syncope: Patient had an episode of syncope that occurred during breakfast on 04/30; no presyncopal times or seizure activity noted. Reported increase in his home BP medications on 04/29 and he had just taken his medications prior to the episode. Labs were significant for  troponin elevation of 1600 > 2300 > 2200. Differential includes arrhythmia and CAD. Cardiac cath yesterday showed mid LAD lesion as well as ostial LAD lesion. Given complicated nature of his lesion and lack of symptoms related to chest pain, he did not undergo PCI at that time.  - Cardiology following, appreciate recommendations - S/p cardiac cath yesterday; did not undergo PCI. Per review of records, would likely require IVUS with possible atherectomy in the future -Continue telemetry  UA showing pyuria; urine cultures positive for Pseudomonas: 1/4 blood cultures positive for Pseudomonas: Completed 7-day course of antibiotics, initially getting cefepime and then completed ceftazidime. Repeat urine culture showed multiple species present, suggest possible recollection. Will hold off for now, patient continues to be afebrile with no leukocytosis.   CKD stage IV: Patient has a baseline Cr ~3.5. His creatinine today is 3.88. Nephrology is currently following.  - Nephrology consulted; appreciate their recs - Holding sodium bicarb due to concern of this contributing to his altered mental status  Hypertension: Patient had his hydralazine and isosorbide medications increased at home before his syncopal episode. The patient remains hypertensive with most recent BP of 166/73 today. BP has recently been within our goal range of 160s-180s; permissive HTN in the setting of recent ischemic stroke. We will continue antihypertensive regimen with imdur, 60 mg PO qd, coreg 25 mg bid, hydral100 tid, amlodipine 5 mg po qd for a goal SBP of 160s-180s. Will slowly begin to titrate to lower BP. -Continue BP regimen as above.  Chronic systolic and diastolic heart failure: BNP on admission elevated to>4000. Was on Lasix initially, but this was recently discontinued given recently climbing Scr levels. Euvolemic on exam. Creatinine today was ~4.01. -Holding home Lasix -Continue beta blocker regimen -Strict  I's/O's -Daily weights   Anemia of chronic disease: -Continue ferrous gluconate -Aranesp per nephrology  Malnutrition: Yesterday, we placed a nutrition consult for malnutrition. The patient has severe Malnutritionrelated to chronic illness(CKD, HF)as evidenced by energy intake < or equal to 75% for > or equal to 1 month, percent weight loss (25% weight loss within 3 months). He reports that he has not been able to eat much while in the hospital because the food has not been very appetizing. Will continue to monitor PO intake. - Nutrition is on board; appreciate their recs.   Orvis Brill, Medical Student 11/20/2019, 12:57 PM Pager: [phone]

## 2019-11-20 NOTE — Progress Notes (Signed)
Progress Note  Patient Name: Dennis Macias Date of Encounter: 11/20/2019  Primary Cardiologist: Larae Grooms, MD   Subjective   No CP or dyspnea  Inpatient Medications    Scheduled Meds: . amLODipine  5 mg Oral Daily  . aspirin  81 mg Oral Daily  . atorvastatin  80 mg Oral QHS  . carvedilol  25 mg Oral BID WC  . clopidogrel  75 mg Oral Daily  . darbepoetin (ARANESP) injection - NON-DIALYSIS  40 mcg Subcutaneous Q Tue-1800  . dorzolamide-timolol  1 drop Right Eye BID  . feeding supplement (ENSURE ENLIVE)  237 mL Oral TID BM  . ferrous gluconate  324 mg Oral Q breakfast  . finasteride  5 mg Oral Daily  . heparin  5,000 Units Subcutaneous Q8H  . hydrALAZINE  100 mg Oral TID  . isosorbide mononitrate  60 mg Oral Daily  . lidocaine  5 mL Intradermal Once  . mouth rinse  15 mL Mouth Rinse BID  . sodium chloride flush  3 mL Intravenous Q12H  . sodium chloride flush  3 mL Intravenous Q12H  . sodium chloride flush  3 mL Intravenous Q12H  . tamsulosin  0.4 mg Oral QPC supper  . vitamin B-12  1,000 mcg Oral Daily   Continuous Infusions: . sodium chloride     PRN Meds: sodium chloride, acetaminophen **OR** acetaminophen, ondansetron (ZOFRAN) IV, sodium chloride flush   Vital Signs    Vitals:   11/19/19 1631 11/19/19 2050 11/20/19 0000 11/20/19 0411  BP: 128/67 (!) 147/70 (!) 145/64 136/69  Pulse: (!) 57 (!) 57 (!) 59 (!) 58  Resp:  16  15  Temp:  97.8 F (36.6 C)  98.4 F (36.9 C)  TempSrc:  Oral  Oral  SpO2:  99%  97%  Weight:    72.4 kg  Height:        Intake/Output Summary (Last 24 hours) at 11/20/2019 0733 Last data filed at 11/20/2019 0418 Gross per 24 hour  Intake 543 ml  Output 201 ml  Net 342 ml   Last 3 Weights 11/20/2019 11/19/2019 11/18/2019  Weight (lbs) 159 lb 9.8 oz 134 lb 7.7 oz 142 lb 10.2 oz  Weight (kg) 72.4 kg 61 kg 64.7 kg      Telemetry    Sinus- Personally Reviewed   Physical Exam   GEN: NAD Neck: supple Cardiac: RRR, no  gallop Respiratory: CTA GI: Soft, NT/ND, right groin with no hematoma and no bruit MS: No edema Neuro:  No focal findings Psych: Normal  Labs    High Sensitivity Troponin:   Recent Labs  Lab 11/06/19 0935 11/06/19 1924 11/06/19 2235  TROPONINIHS 1,618* 2,384* 2,233*      Chemistry Recent Labs  Lab 11/18/19 0501 11/19/19 0817 11/20/19 0517  NA 141 142 141  K 3.9 3.8 4.0  CL 109 109 111  CO2 24 22 22   GLUCOSE 96 97 166*  BUN 39* 40* 41*  CREATININE 3.88* 3.88* 4.01*  CALCIUM 8.6* 8.3* 8.3*  ALBUMIN 2.0* 1.9* 1.9*  GFRNONAA 15* 15* 14*  GFRAA 17* 17* 17*  ANIONGAP 8 11 8      Hematology Recent Labs  Lab 11/17/19 0248 11/18/19 0501 11/19/19 0817  WBC 6.8 6.3 5.5  RBC 3.16* 3.18* 2.86*  HGB 9.2* 9.3* 8.3*  HCT 28.5* 28.9* 26.3*  MCV 90.2 90.9 92.0  MCH 29.1 29.2 29.0  MCHC 32.3 32.2 31.6  RDW 13.5 14.0 14.2  PLT 281 283 250  Patient Profile     69 y.o.malewith past medical history of coronary artery disease, ischemic cardiomyopathy, hypertension, hyperlipidemia, chronic stage IV kidney disease, chronic indwelling Foley catheter, history of CVA for evaluation of syncope. Echocardiogram April 2021 showed ejection fraction 40 to 19%, grade 1 diastolic dysfunction, severe left atrial enlargement, mild mitral regurgitation. Nuclear study April 2021 showed ejection fraction 40%. There was apical infarction with peri-infarct ischemia. Patient had been treated medically due to renal insufficiency and lack of symptoms.  Plan following syncopal episode was for cardiac catheterization but patient developed altered mental status, CVA and fever.  Assessment & Plan    1 non-ST elevation myocardial infarction-Continue aspirin, statin and carvedilol.  Catheterization results noted.  Patient has significant LAD disease but he is not having chest pain.  Medical therapy has been recommended in light of his significant renal insufficiency.  If he progresses to dialysis in  the future could consider PCI at that time.    2 syncope-etiology unclear.  Question ventricular arrhythmia.  However as outlined above plan medical therapy for coronary artery disease and ischemic cardiomyopathy.  3 recent CVA-neurology following.  They do not feel this was responsible for altered mental status and no contraindication to cardiac catheterization.  4 cardiomyopathy-continue carvedilol and hydralazine/nitrates.  He is not on an ACE inhibitor, ARB or Entresto due to renal insufficiency.    5 chronic stage IV kidney disease-only 20 cc of dye used for cardiac catheterization.  Follow renal function closely.  6 Pseudomonas UTI-continue antibiotics.  7 hypertension-continue present medications.   For questions or updates, please contact [facility] Please consult [email or web address] for contact info under        Signed, Kirk Ruths, MD  11/20/2019, 7:33 AM

## 2019-11-20 NOTE — Progress Notes (Signed)
Occupational Therapy Treatment Patient Details Name: Dennis Macias MRN: [id number] DOB: 1950/07/23 Today's Date: 11/20/2019    History of present illness Pt is a 69 y.o. male with a PMHx of CAD, ischemic cardiomyopathy, chronic systolic and diastolic heart failure, CKD stage IV, anemia of chronic disease, and BPH with chronic indwelling foley catheter s/p TURP 10/20/19. Came to ED after syncopal episode found to be hypertensive and febrile. Admitted 11/06/19. Plan for cardiac cath however creatinine levels elevated. Cardiology recommends a left heart cath eventually; will hold off for now given acutely altered mental status. Of note, pt with new AMS and a code stroke was called on 5/6. MRI of the brain revealed ischemic infarct in the left parietal region.   OT comments  Pt. Seen for skilled OT treatment session.  Agreeable to bed mobility and sitting eob in preparation for increasing activity tolerance and participation with ADLs.  Moves well but requires encouragement to engage and participate in tasks.  Follow Up Recommendations  CIR;Supervision/Assistance - 24 hour    Equipment Recommendations  Other (comment)    Recommendations for Other Services Rehab consult    Precautions / Restrictions Precautions Precautions: Fall Precaution Comments: global aphasia Restrictions Weight Bearing Restrictions: No       Mobility Bed Mobility Overal bed mobility: Needs Assistance Bed Mobility: Rolling;Sidelying to Sit Rolling: Min guard Sidelying to sit: Min assist;HOB elevated     Sit to sidelying: Min guard General bed mobility comments: able to reach for L bed rail and initiate rolling to L. able to bring b les off of bed with cues.  assistance to bring trunk upright into sitting.  able to scoot x3 towards hob for back to bed. then states "thats it" and began to lay down without warning.  no assistance needed to bring b les into bed  Transfers                      Balance                                            ADL either performed or assessed with clinical judgement   ADL                                               Vision       Perception     Praxis      Cognition Arousal/Alertness: Awake/alert Behavior During Therapy: Flat affect Overall Cognitive Status: Difficult to assess                                          Exercises     Shoulder Instructions       General Comments  is from [place] originally. States he worked at [employer] for over 20 years    Pertinent Vitals/ Pain       Pain Assessment: Faces Faces Pain Scale: Hurts a little bit Pain Location: states "it just dont feel good" in relation to asking what hurts and where "all over"  Home Living  Prior Functioning/Environment              Frequency  Min 2X/week        Progress Toward Goals  OT Goals(current goals can now be found in the care plan section)  Progress towards OT goals: Progressing toward goals     Plan      Co-evaluation                 AM-PAC OT "6 Clicks" Daily Activity     Outcome Measure   Help from another person eating meals?: A Little Help from another person taking care of personal grooming?: A Little Help from another person toileting, which includes using toliet, bedpan, or urinal?: Total Help from another person bathing (including washing, rinsing, drying)?: A Lot Help from another person to put on and taking off regular upper body clothing?: A Lot Help from another person to put on and taking off regular lower body clothing?: A Lot 6 Click Score: 13    End of Session    OT Visit Diagnosis: Unsteadiness on feet (R26.81);Muscle weakness (generalized) (M62.81);Cognitive communication deficit (R41.841) Symptoms and signs involving cognitive functions: Cerebral infarction   Activity Tolerance Patient tolerated  treatment well   Patient Left in chair;with call bell/phone within reach;with bed alarm set;with family/visitor present   Nurse Communication          Time: 1208-1219 OT Time Calculation (min): 11 min  Charges: OT General Charges $OT Visit: 1 Visit OT Treatments $Therapeutic Activity: 8-22 mins  Dennis Macias, COTA/L Acute Rehabilitation [phone]   Dennis Macias 11/20/2019, 12:32 PM

## 2019-11-20 NOTE — Telephone Encounter (Signed)
    Attention TOC pool,  This patient will need a TOC phone call after discharge. Date of discharge pending; [name] signing off today. Follow-up appointment has already been arranged with: Dr. Irish Lack on 5/28. They are a patient of Larae Grooms, MD.  Thank you! Charlie Pitter, PA-C

## 2019-11-20 NOTE — Progress Notes (Signed)
PT Cancellation Note  Patient Details Name: Dennis Macias MRN: [id number] DOB: 1950/12/11   Cancelled Treatment:    Reason Eval/Treat Not Completed: Patient declined, no reason specified Pt reports that he just fell asleep and he does not want to work with therapy this afternoon. "Maybe tomorrow." Educated that it is Friday and he would not receive therapy over the weekend. Pt also educated that if he continues to refuse. CIR will no longer be a d/c option. Pt continues to prefer to take a nap. PT will follow back on Monday.  La Shehan B. [name] PT, DPT Acute Rehabilitation Services Pager [phone] Office [phone]    [place] 11/20/2019, 2:55 PM

## 2019-11-20 NOTE — Progress Notes (Signed)
Patient ID: Darris Staiger, male   DOB: [birth year], 69 y.o.   MRN: [id number]    S: Only 200 cc of urine documented but some concern is not well recorded.  Creatinine relatively stable and electrolytes largely unchanged.  Status post heart catheterization yesterday.  Will need to monitor for signs of contrast-induced injury.  Does not appear to be significantly uremic.  Denies any concerns.  Status post cardiac cath yesterday.  Mid LAD lesion as well as ostial LAD lesion.  Given complicated nature of his lesion and lack of symptoms related to chest pain he did not undergo PCI.  O:BP (!) 142/69   Pulse 86   Temp 97.7 F (36.5 C) (Oral)   Resp 14   Ht 6' (1.829 m)   Wt 72.4 kg   SpO2 100%   BMI 21.65 kg/m   Intake/Output Summary (Last 24 hours) at 11/20/2019 1146 Last data filed at 11/20/2019 0418 Gross per 24 hour  Intake 480 ml  Output 201 ml  Net 279 ml   Intake/Output: I/O last 3 completed shifts: In: 666 [P.O.:660; I.V.:6] Out: 1517 [Urine:1150; Stool:1]  Intake/Output this shift:  No intake/output data recorded. Weight change: 11.4 kg Gen: NAD alert CVS: Normal rate, no murmur Resp: Bilateral chest rise, no increased work of breathing Abd: +bs, soft, NT/nD Ext: no edema  Recent Labs  Lab 11/14/19 0748 11/15/19 0336 11/16/19 0710 11/17/19 0248 11/18/19 0501 11/19/19 0817 11/20/19 0517  NA 142 141 143 142 141 142 141  K 3.3* 3.1* 3.5 3.5 3.9 3.8 4.0  CL 106 106 106 105 109 109 111  CO2 23 26 28 25 24 22 22   GLUCOSE 106* 123* 125* 111* 96 97 166*  BUN 46* 38* 38* 39* 39* 40* 41*  CREATININE 3.42* 3.34* 3.52* 3.56* 3.88* 3.88* 4.01*  ALBUMIN 2.0* 2.1* 2.0* 2.0* 2.0* 1.9* 1.9*  CALCIUM 8.3* 8.1* 8.5* 8.9 8.6* 8.3* 8.3*  PHOS 3.2 2.8 2.9 3.1 3.4 3.5 3.7   Liver Function Tests: Recent Labs  Lab 11/18/19 0501 11/19/19 0817 11/20/19 0517  ALBUMIN 2.0* 1.9* 1.9*   No results for input(s): LIPASE, AMYLASE in the last 168 hours. No results for input(s): AMMONIA in  the last 168 hours. CBC: Recent Labs  Lab 11/15/19 0336 11/15/19 0336 11/16/19 0710 11/16/19 0710 11/17/19 0248 11/18/19 0501 11/19/19 0817  WBC 7.9   < > 6.1   < > 6.8 6.3 5.5  HGB 8.4*   < > 8.8*   < > 9.2* 9.3* 8.3*  HCT 25.0*   < > 26.9*   < > 28.5* 28.9* 26.3*  MCV 87.7  --  89.1  --  90.2 90.9 92.0  PLT 230   < > 259   < > 281 283 250   < > = values in this interval not displayed.   Cardiac Enzymes: No results for input(s): CKTOTAL, CKMB, CKMBINDEX, TROPONINI in the last 168 hours. CBG: Recent Labs  Lab 11/14/19 0755  GLUCAP 101*    Iron Studies: No results for input(s): IRON, TIBC, TRANSFERRIN, FERRITIN in the last 72 hours. Studies/Results: CARDIAC CATHETERIZATION  Result Date: 11/19/2019  Mid LAD lesion is 75% stenosed.  Ost LAD to Prox LAD lesion is 80% stenosed. In the cranial views, it is seen that the disease extends up to the ostial LAD, and there is significant calcification.  LV end diastolic pressure is normal.  There is no aortic valve stenosis.  PCI of the proximal - ostial LAD would likely require  IVUS with possible atherectomy.  There is a mid to distal LAD lesion which is eccentric.  Given his lack of angina and issues with renal insufficiency, would not pursue PCI at this time.  Would reconsider based on his renal function.    Marland Kitchen amLODipine  5 mg Oral Daily  . aspirin  81 mg Oral Daily  . atorvastatin  80 mg Oral QHS  . carvedilol  25 mg Oral BID WC  . clopidogrel  75 mg Oral Daily  . darbepoetin (ARANESP) injection - NON-DIALYSIS  40 mcg Subcutaneous Q Tue-1800  . dorzolamide-timolol  1 drop Right Eye BID  . feeding supplement (ENSURE ENLIVE)  237 mL Oral TID BM  . ferrous gluconate  324 mg Oral Q breakfast  . finasteride  5 mg Oral Daily  . heparin  5,000 Units Subcutaneous Q8H  . hydrALAZINE  100 mg Oral TID  . isosorbide mononitrate  60 mg Oral Daily  . lidocaine  5 mL Intradermal Once  . mouth rinse  15 mL Mouth Rinse BID  . sodium  chloride flush  3 mL Intravenous Q12H  . sodium chloride flush  3 mL Intravenous Q12H  . sodium chloride flush  3 mL Intravenous Q12H  . tamsulosin  0.4 mg Oral QPC supper  . vitamin B-12  1,000 mcg Oral Daily    BMET    Component Value Date/Time   NA 141 11/20/2019 0517   NA 140 08/13/2019 1139   K 4.0 11/20/2019 0517   CL 111 11/20/2019 0517   CO2 22 11/20/2019 0517   GLUCOSE 166 (H) 11/20/2019 0517   BUN 41 (H) 11/20/2019 0517   BUN 36 (H) 08/13/2019 1139   CREATININE 4.01 (H) 11/20/2019 0517   CALCIUM 8.3 (L) 11/20/2019 0517   GFRNONAA 14 (L) 11/20/2019 0517   GFRAA 17 (L) 11/20/2019 0517   CBC    Component Value Date/Time   WBC 5.5 11/19/2019 0817   RBC 2.86 (L) 11/19/2019 0817   HGB 8.3 (L) 11/19/2019 0817   HGB 7.7 (L) 08/13/2019 1139   HCT 26.3 (L) 11/19/2019 0817   HCT 22.3 (L) 08/13/2019 1139   PLT 250 11/19/2019 0817   PLT 392 08/13/2019 1139   MCV 92.0 11/19/2019 0817   MCV 86 08/13/2019 1139   MCH 29.0 11/19/2019 0817   MCHC 31.6 11/19/2019 0817   RDW 14.2 11/19/2019 0817   RDW 14.4 08/13/2019 1139   LYMPHSABS 1.1 11/06/2019 0935   LYMPHSABS 2.0 02/24/2018 1142   MONOABS 0.6 11/06/2019 0935   EOSABS 0.0 11/06/2019 0935   EOSABS 0.1 02/24/2018 1142   BASOSABS 0.0 11/06/2019 0935   BASOSABS 0.0 02/24/2018 1142    Assessment/Plan:  1. AMS- per neuro most likely due to cefepime related encephalopathy.  Changed to [facility] 11/12/19.  Seems to be improving 2. New Left parietal infarct seen on MRI - per Neuro, likely due to small vessel disease and not likely to have caused his AMS and speech issues.  Neuro following. 3. AKI/CKD stage 4-baseline 3.3-3.7.  Minimal AKI at this time and near baseline.  No obvious indications for dialysis at this time does not appear to be significantly uremic.  Obtain contrast yesterday so will need to monitor for contrast-induced injury.  Before starting dialysis would need to have goals of care conversations. 4. Syncope/NSTEMI-  possible arrythmiaor could have been TIA. Cardiology proceeded with cath yesterday with ostial lesion considering PCI but delaying for now given lack of symptoms. 5. CKD stage 4-  presumably due to DM and followed by Dr. Hollie Salk in our office and baseline Scr 3.3-3.7. is at baseline 6. HTN-blood pressure near goal today. Currently receives amlodipine 5 mg daily, carvedilol 25 mg twice daily, hydralazine 100 mg 3 times daily, Imdur 60 mg daily.  Could consider max dose of Norvasc.  In the future optimally would have him on ACE/ARB or loop diuretic will avoid adding for now given tenuous GFR status.  7. Fever-  Afebrile. Urine culture + for pseudomonas aeruginosa. abx complete 8. Anemia of CKD-started aranesp 11/10/19 at 40 mg-  May need increase for next dose on 5/18, iron stores OK.  Reesa Chew  [employer] [phone]

## 2019-11-21 LAB — RENAL FUNCTION PANEL
Albumin: 1.9 g/dL — ABNORMAL LOW (ref 3.5–5.0)
Anion gap: 9 (ref 5–15)
BUN: 41 mg/dL — ABNORMAL HIGH (ref 8–23)
CO2: 23 mmol/L (ref 22–32)
Calcium: 8.5 mg/dL — ABNORMAL LOW (ref 8.9–10.3)
Chloride: 110 mmol/L (ref 98–111)
Creatinine, Ser: 3.99 mg/dL — ABNORMAL HIGH (ref 0.61–1.24)
GFR calc Af Amer: 17 mL/min — ABNORMAL LOW (ref >60)
GFR calc non Af Amer: 14 mL/min — ABNORMAL LOW (ref >60)
Glucose, Bld: 163 mg/dL — ABNORMAL HIGH (ref 70–99)
Phosphorus: 3.7 mg/dL (ref 2.5–4.6)
Potassium: 4.1 mmol/L (ref 3.5–5.1)
Sodium: 142 mmol/L (ref 135–145)

## 2019-11-21 NOTE — Progress Notes (Signed)
Patient ID: Dennis Macias, male   DOB: 07-25-1950, 69 y.o.   MRN: [id number]    S: Urine output inconsistently documented.  Patient does not express any concerns.  Creatinine is stable at this time.    O:BP 136/65   Pulse 63   Temp 98.7 F (37.1 C) (Oral)   Resp 11   Ht 6' (1.829 m)   Wt 72.3 kg   SpO2 98%   BMI 21.62 kg/m   Intake/Output Summary (Last 24 hours) at 11/21/2019 1154 Last data filed at 11/21/2019 1013 Gross per 24 hour  Intake 483 ml  Output 700 ml  Net -217 ml   Intake/Output: I/O last 3 completed shifts: In: 600 [P.O.:600] Out: 701 [Urine:700; Stool:1]  Intake/Output this shift:  Total I/O In: 123 [P.O.:120; I.V.:3] Out: 400 [Urine:400] Weight change: -0.1 kg Gen: NAD alert CVS: Normal rate, no murmur Resp: Bilateral chest rise, no increased work of breathing Abd: +bs, soft, NT/nD Ext: no edema  Recent Labs  Lab 11/15/19 0336 11/16/19 0710 11/17/19 0248 11/18/19 0501 11/19/19 0817 11/20/19 0517 11/21/19 0406  NA 141 143 142 141 142 141 142  K 3.1* 3.5 3.5 3.9 3.8 4.0 4.1  CL 106 106 105 109 109 111 110  CO2 26 28 25 24 22 22 23   GLUCOSE 123* 125* 111* 96 97 166* 163*  BUN 38* 38* 39* 39* 40* 41* 41*  CREATININE 3.34* 3.52* 3.56* 3.88* 3.88* 4.01* 3.99*  ALBUMIN 2.1* 2.0* 2.0* 2.0* 1.9* 1.9* 1.9*  CALCIUM 8.1* 8.5* 8.9 8.6* 8.3* 8.3* 8.5*  PHOS 2.8 2.9 3.1 3.4 3.5 3.7 3.7   Liver Function Tests: Recent Labs  Lab 11/19/19 0817 11/20/19 0517 11/21/19 0406  ALBUMIN 1.9* 1.9* 1.9*   No results for input(s): LIPASE, AMYLASE in the last 168 hours. No results for input(s): AMMONIA in the last 168 hours. CBC: Recent Labs  Lab 11/15/19 0336 11/15/19 0336 11/16/19 0710 11/16/19 0710 11/17/19 0248 11/18/19 0501 11/19/19 0817  WBC 7.9   < > 6.1   < > 6.8 6.3 5.5  HGB 8.4*   < > 8.8*   < > 9.2* 9.3* 8.3*  HCT 25.0*   < > 26.9*   < > 28.5* 28.9* 26.3*  MCV 87.7  --  89.1  --  90.2 90.9 92.0  PLT 230   < > 259   < > 281 283 250   < > =  values in this interval not displayed.   Cardiac Enzymes: No results for input(s): CKTOTAL, CKMB, CKMBINDEX, TROPONINI in the last 168 hours. CBG: No results for input(s): GLUCAP in the last 168 hours.  Iron Studies: No results for input(s): IRON, TIBC, TRANSFERRIN, FERRITIN in the last 72 hours. Studies/Results: No results found. Marland Kitchen amLODipine  5 mg Oral Daily  . aspirin  81 mg Oral Daily  . atorvastatin  80 mg Oral QHS  . carvedilol  25 mg Oral BID WC  . clopidogrel  75 mg Oral Daily  . darbepoetin (ARANESP) injection - NON-DIALYSIS  40 mcg Subcutaneous Q Tue-1800  . dorzolamide-timolol  1 drop Right Eye BID  . feeding supplement (ENSURE ENLIVE)  237 mL Oral TID BM  . ferrous gluconate  324 mg Oral Q breakfast  . finasteride  5 mg Oral Daily  . heparin  5,000 Units Subcutaneous Q8H  . hydrALAZINE  100 mg Oral TID  . isosorbide mononitrate  60 mg Oral Daily  . lidocaine  5 mL Intradermal Once  . mouth rinse  15 mL Mouth Rinse BID  . sodium chloride flush  3 mL Intravenous Q12H  . sodium chloride flush  3 mL Intravenous Q12H  . sodium chloride flush  3 mL Intravenous Q12H  . tamsulosin  0.4 mg Oral QPC supper  . vitamin B-12  1,000 mcg Oral Daily    BMET    Component Value Date/Time   NA 142 11/21/2019 0406   NA 140 08/13/2019 1139   K 4.1 11/21/2019 0406   CL 110 11/21/2019 0406   CO2 23 11/21/2019 0406   GLUCOSE 163 (H) 11/21/2019 0406   BUN 41 (H) 11/21/2019 0406   BUN 36 (H) 08/13/2019 1139   CREATININE 3.99 (H) 11/21/2019 0406   CALCIUM 8.5 (L) 11/21/2019 0406   GFRNONAA 14 (L) 11/21/2019 0406   GFRAA 17 (L) 11/21/2019 0406   CBC    Component Value Date/Time   WBC 5.5 11/19/2019 0817   RBC 2.86 (L) 11/19/2019 0817   HGB 8.3 (L) 11/19/2019 0817   HGB 7.7 (L) 08/13/2019 1139   HCT 26.3 (L) 11/19/2019 0817   HCT 22.3 (L) 08/13/2019 1139   PLT 250 11/19/2019 0817   PLT 392 08/13/2019 1139   MCV 92.0 11/19/2019 0817   MCV 86 08/13/2019 1139   MCH 29.0  11/19/2019 0817   MCHC 31.6 11/19/2019 0817   RDW 14.2 11/19/2019 0817   RDW 14.4 08/13/2019 1139   LYMPHSABS 1.1 11/06/2019 0935   LYMPHSABS 2.0 02/24/2018 1142   MONOABS 0.6 11/06/2019 0935   EOSABS 0.0 11/06/2019 0935   EOSABS 0.1 02/24/2018 1142   BASOSABS 0.0 11/06/2019 0935   BASOSABS 0.0 02/24/2018 1142    Assessment/Plan:  1. AMS- per neuro most likely due to cefepime related encephalopathy.  Changed to [name] 11/12/19.  Seems to be improving 2. New Left parietal infarct seen on MRI - per Neuro, likely due to small vessel disease and not likely to have caused his AMS and speech issues.  Neuro following. 3. AKI/CKD stage 4-baseline 3.3-3.7.  Minimal AKI at this time and near baseline.  No obvious indications for dialysis at this time does not appear to be significantly uremic.  Races contrast at risk for kidney injury but creatinine stable at this time.  Before starting dialysis would need to have goals of care conversations. 4. Syncope/NSTEMI- possible arrythmiaor could have been TIA. Cardiology proceeded with cath yesterday with ostial lesion considering PCI but delaying for now given lack of symptoms. 5. CKD stage 4- presumably due to DM and followed by Dr. Hollie Salk in our office and baseline Scr 3.3-3.7. is at baseline 6. HTN-blood pressure slightly above goal. Currently receives amlodipine 5 mg daily, carvedilol 25 mg twice daily, hydralazine 100 mg 3 times daily, Imdur 60 mg daily.  Could consider max dose of Norvasc.  In the future optimally would have him on ACE/ARB or loop diuretic will avoid adding for now given tenuous GFR status.  7. Fever-  Afebrile. Urine culture + for pseudomonas aeruginosa. abx complete 8. Anemia of CKD-started aranesp 11/10/19 at 40 mg-  May need increase for next dose on 5/18, iron stores OK.  Reesa Chew  [employer] [phone]

## 2019-11-21 NOTE — Progress Notes (Addendum)
Subjective: Patient reports that he is doing well today, no acute events overnight.  He denies any chest pain, lightheadedness, dizziness, or other symptoms.  Discussed that overall he is doing well however we need to see how he does working with therapy to see where he should go on discharge.  He reported that he did not work with therapy yesterday.  We discussed that we will see if they may be able to come back today to evaluate him, he reported that he would work with him today.  Objective:  Vital signs in last 24 hours: Vitals:   11/20/19 1615 11/20/19 2056 11/20/19 2118 11/21/19 0536  BP: 129/62 136/65 136/65   Pulse: 60 63    Resp: 15 11    Temp: 98.5 F (36.9 C) 98.1 F (36.7 C)  98.7 F (37.1 C)  TempSrc: Oral Oral  Oral  SpO2: 99% 98%    Weight:    72.3 kg  Height:       General: Elderly male, no acute distress, lying in bed Cardiac: Regular rate and rhythm, no murmurs rubs or gallops Pulmonary: CTA BL, no wheezing, rhonchi, Rales Abdomen: Soft, nondistended, nontender active bowel sounds Neuro: Awake, alert and oriented x3, moves all extremities, no obvious focal neurological deficits  Assessment/Plan:  Active Problems:   History of stroke   Syncope and collapse   Syncope   Chronic combined systolic and diastolic heart failure (HCC)   Non-STEMI (non-ST elevated myocardial infarction) (HCC)   Encephalopathies   Acute ischemic left ICA stroke (HCC)   Bacteremia   Hypertensive urgency   Gait disorder   Protein-calorie malnutrition, severe  Himmat Enberg is a 69 y.o. male with a history of CAD, chronic systolic and diastolic heart failure, CKD stage IV, anemia of chronic disease, and BPH with chronic indwelling Foley catheter status post TURP who presented with a syncopal episode that occurred on 11/06/19, now with acute onset confusion since 05/05, with recent MRI consistent with ischemic infarct of theleftparietal region.  Syncope: Unclear etiology, arrhythmia  versus CAD versus infection.  Noted to have elevated troponins of 1600 > 2300 >2200. Cardiac cath showed mid LAD lesion as well as ostial LAD lesion.Given complicated nature of his lesion and lack of symptoms related to chest pain, he did not undergo PCI at that time.  May require IVUS with possible atherectomy in the future.  No evidence of arrhythmias on telemetry.  No recurrence of symptoms during admission.  -Continue aspirin, statin, and Coreg -Cardiology signed off, plan for follow up outpatient -Continue telemetry  Cefepime induced encephalopathy: Acute CVA: Patient noted to be encephalopathic during admission on 5/5, obtain head CT that was negative.  Continued to have worsening confusion on 5/6 in code stroke was called.  MRI obtained on 5/7 showed ischemic infarct in left parietal region.  Neurology was following, acute encephalopathy thought to be secondary to cefepime, improved with holding cefepime.  Is currently on Plavix for his acute CVA, of note he has had history of hematuria in the past while being on dual antiplatelet therapy.  We will continue to monitor for this.  Today patient is alert and oriented, no significant acute neurological deficits at this time.  PT and OT were recommending CIR, patient has not been working with PT or OT, CIR that if he does not work with PT and OT then he may benefit more from a SNF on discharge.  Discussed with patient today the need to work with PT and OT to  figure out discharge planning, he is agreeable.  Reach out to PT and OT to reevaluate him today.  -PT/OT/SLP, appreciate recommendations -Continue Plavix 75 mg daily -We will need follow-up with neurology outpatient -Consider outpatient 30-day event monitor to evaluate for paroxysmal A. Fib -CIR versus SNF on discharge  UTI: BPH s/p TURP: Pseudomonas, 1/4 blood cultures positive for pseudomonas. Completed 7 day course of antibiotics.  Initially with cefepime however the transition to  ceftazidime to complete 7-day course.  Foley catheter removed with no issues. Resolved.   CKD stage IV: -Baseline creatinine 3.5, creatinine today is 3.99.  You received contrast for his cardiac catheterization, monitoring for contrast-induced injury.  Patient would need goals of care discussion before starting dialysis if he were to require it. -Neurology following.  Appreciate recommendations.  Hypertension BP in normal range now, around 130s/60s. We will continue antihypertensive regimen with imdur, 60 mg PO qd, coreg 25 mg bid, hydral100 tid, amlodipine 5 mg po qdfor a goal SBP of 160s-180s. Will slowly begin to titrate to lower BP.  Malnutrition: -Liberalized his diet to regular diet yesterday, noted to have severe malnutrition from his chronic illnesses.  -Nutrition on board, appreciate recommendations.  Prior to Admission Living Arrangement: Home  Anticipated Discharge Location: TBD, CIR versus SNF Barriers to Discharge: PT and OT evaluation Dispo: Anticipated discharge in approximately 2-3 day(s).   [name], MD 11/21/2019, 6:33 AM Pager: [phone]

## 2019-11-22 LAB — RENAL FUNCTION PANEL
Albumin: 1.9 g/dL — ABNORMAL LOW (ref 3.5–5.0)
Anion gap: 7 (ref 5–15)
BUN: 43 mg/dL — ABNORMAL HIGH (ref 8–23)
CO2: 24 mmol/L (ref 22–32)
Calcium: 8.5 mg/dL — ABNORMAL LOW (ref 8.9–10.3)
Chloride: 110 mmol/L (ref 98–111)
Creatinine, Ser: 4.1 mg/dL — ABNORMAL HIGH (ref 0.61–1.24)
GFR calc Af Amer: 16 mL/min — ABNORMAL LOW (ref >60)
GFR calc non Af Amer: 14 mL/min — ABNORMAL LOW (ref >60)
Glucose, Bld: 169 mg/dL — ABNORMAL HIGH (ref 70–99)
Phosphorus: 3.9 mg/dL (ref 2.5–4.6)
Potassium: 4.4 mmol/L (ref 3.5–5.1)
Sodium: 141 mmol/L (ref 135–145)

## 2019-11-22 MED ORDER — HYDRALAZINE HCL 50 MG PO TABS
75.0000 mg | ORAL_TABLET | Freq: Three times a day (TID) | ORAL | Status: DC
  Administered 2019-11-22 – 2019-11-25 (×11): 75 mg via ORAL
  Filled 2019-11-22 (×11): qty 1

## 2019-11-22 NOTE — Progress Notes (Signed)
Patient ID: Dennis Macias, male   DOB: Jan 06, 1951, 69 y.o.   MRN: [id number]    S: No concerns by patient at this time.  Creatinine slightly worse today in the setting of recently receiving contrast.  Otherwise no complaints.  O:BP (!) 150/66   Pulse 62   Temp 99.1 F (37.3 C) (Oral)   Resp 14   Ht 6' (1.829 m)   Wt 72.8 kg   SpO2 99%   BMI 21.77 kg/m   Intake/Output Summary (Last 24 hours) at 11/22/2019 1140 Last data filed at 11/22/2019 0622 Gross per 24 hour  Intake 480 ml  Output 250 ml  Net 230 ml   Intake/Output: I/O last 3 completed shifts: In: 723 [P.O.:720; I.V.:3] Out: 650 [Urine:650]  Intake/Output this shift:  No intake/output data recorded. Weight change: 0.5 kg Gen: NAD alert CVS: Normal rate, no murmur Resp: Bilateral chest rise, no increased work of breathing Abd: +bs, soft, NT/nD Ext: no edema  Recent Labs  Lab 11/16/19 0710 11/17/19 0248 11/18/19 0501 11/19/19 0817 11/20/19 0517 11/21/19 0406 11/22/19 0356  NA 143 142 141 142 141 142 141  K 3.5 3.5 3.9 3.8 4.0 4.1 4.4  CL 106 105 109 109 111 110 110  CO2 28 25 24 22 22 23 24   GLUCOSE 125* 111* 96 97 166* 163* 169*  BUN 38* 39* 39* 40* 41* 41* 43*  CREATININE 3.52* 3.56* 3.88* 3.88* 4.01* 3.99* 4.10*  ALBUMIN 2.0* 2.0* 2.0* 1.9* 1.9* 1.9* 1.9*  CALCIUM 8.5* 8.9 8.6* 8.3* 8.3* 8.5* 8.5*  PHOS 2.9 3.1 3.4 3.5 3.7 3.7 3.9   Liver Function Tests: Recent Labs  Lab 11/20/19 0517 11/21/19 0406 11/22/19 0356  ALBUMIN 1.9* 1.9* 1.9*   No results for input(s): LIPASE, AMYLASE in the last 168 hours. No results for input(s): AMMONIA in the last 168 hours. CBC: Recent Labs  Lab 11/16/19 0710 11/16/19 0710 11/17/19 0248 11/18/19 0501 11/19/19 0817  WBC 6.1   < > 6.8 6.3 5.5  HGB 8.8*   < > 9.2* 9.3* 8.3*  HCT 26.9*   < > 28.5* 28.9* 26.3*  MCV 89.1  --  90.2 90.9 92.0  PLT 259   < > 281 283 250   < > = values in this interval not displayed.   Cardiac Enzymes: No results for input(s):  CKTOTAL, CKMB, CKMBINDEX, TROPONINI in the last 168 hours. CBG: No results for input(s): GLUCAP in the last 168 hours.  Iron Studies: No results for input(s): IRON, TIBC, TRANSFERRIN, FERRITIN in the last 72 hours. Studies/Results: No results found. Marland Kitchen amLODipine  5 mg Oral Daily  . aspirin  81 mg Oral Daily  . atorvastatin  80 mg Oral QHS  . carvedilol  25 mg Oral BID WC  . clopidogrel  75 mg Oral Daily  . darbepoetin (ARANESP) injection - NON-DIALYSIS  40 mcg Subcutaneous Q Tue-1800  . dorzolamide-timolol  1 drop Right Eye BID  . feeding supplement (ENSURE ENLIVE)  237 mL Oral TID BM  . ferrous gluconate  324 mg Oral Q breakfast  . finasteride  5 mg Oral Daily  . heparin  5,000 Units Subcutaneous Q8H  . hydrALAZINE  75 mg Oral TID  . isosorbide mononitrate  60 mg Oral Daily  . lidocaine  5 mL Intradermal Once  . mouth rinse  15 mL Mouth Rinse BID  . sodium chloride flush  3 mL Intravenous Q12H  . sodium chloride flush  3 mL Intravenous Q12H  . sodium  chloride flush  3 mL Intravenous Q12H  . tamsulosin  0.4 mg Oral QPC supper  . vitamin B-12  1,000 mcg Oral Daily    BMET    Component Value Date/Time   NA 141 11/22/2019 0356   NA 140 08/13/2019 1139   K 4.4 11/22/2019 0356   CL 110 11/22/2019 0356   CO2 24 11/22/2019 0356   GLUCOSE 169 (H) 11/22/2019 0356   BUN 43 (H) 11/22/2019 0356   BUN 36 (H) 08/13/2019 1139   CREATININE 4.10 (H) 11/22/2019 0356   CALCIUM 8.5 (L) 11/22/2019 0356   GFRNONAA 14 (L) 11/22/2019 0356   GFRAA 16 (L) 11/22/2019 0356   CBC    Component Value Date/Time   WBC 5.5 11/19/2019 0817   RBC 2.86 (L) 11/19/2019 0817   HGB 8.3 (L) 11/19/2019 0817   HGB 7.7 (L) 08/13/2019 1139   HCT 26.3 (L) 11/19/2019 0817   HCT 22.3 (L) 08/13/2019 1139   PLT 250 11/19/2019 0817   PLT 392 08/13/2019 1139   MCV 92.0 11/19/2019 0817   MCV 86 08/13/2019 1139   MCH 29.0 11/19/2019 0817   MCHC 31.6 11/19/2019 0817   RDW 14.2 11/19/2019 0817   RDW 14.4  08/13/2019 1139   LYMPHSABS 1.1 11/06/2019 0935   LYMPHSABS 2.0 02/24/2018 1142   MONOABS 0.6 11/06/2019 0935   EOSABS 0.0 11/06/2019 0935   EOSABS 0.1 02/24/2018 1142   BASOSABS 0.0 11/06/2019 0935   BASOSABS 0.0 02/24/2018 1142    Assessment/Plan:  1. AMS-likely secondary to cefepime.  Greatly improved at this time. 2. New Left parietal infarct seen on MRI - per Neuro, likely due to small vessel disease and not likely to have caused his AMS and speech issues.   3. AKI/CKD stage 4-baseline 3.3-3.7.  Minimal AKI.  Received contrast recently and has had small rise in creatinine likely associated with this but no significant concerns.  No signs of uremia and no indications for dialysis. Before starting dialysis would need to have goals of care conversations. 4. Syncope/NSTEMI- possible arrythmiaor could have been TIA. Cardiology proceeded with cath yesterday with ostial lesion considering PCI but delaying for now given lack of symptoms. 5. CKD stage 4- presumably due to DM and followed by Dr. Hollie Salk in our office and baseline Scr 3.3-3.7.  He is near his baseline at this time 6. HTN-blood pressure slightly above goal. Currently receives amlodipine 5 mg daily, carvedilol 25 mg twice daily, hydralazine 100 mg 3 times daily, Imdur 60 mg daily.  Could consider max dose of Norvasc.  In the future optimally would have him on ACE/ARB or loop diuretic will avoid adding for now given tenuous GFR status.  Fluctuating blood pressures over the last 24 hours and will continue to monitor. 7. Fever-  Afebrile. Urine culture + for pseudomonas aeruginosa. abx complete 8. Anemia of CKD-started aranesp 11/10/19 at 40 mg-  May need increase for next dose on 5/18, iron stores OK.  Reesa Chew  [employer] [phone]

## 2019-11-22 NOTE — Discharge Summary (Signed)
Name: Dennis Macias MRN: [id number] DOB: [birth year] 69 y.o. PCP: [name], MD  Date of Admission: 11/06/2019  8:03 AM Date of Discharge: 11/25/19 Attending Physician: Lucious Groves, DO  Discharge Diagnosis:  1. Syncope 2. Pseudomonas bacteremia 3. Pseudomonas UTI 4. Cefepime neurotoxicity 5. Acute CVA in parietal region 6. NSTEMI--type I vs type II 7. Decompensated heart failure 8. Non obstructive CAD 9. Poorly controlled hypertension 10. Severe Protein calorie malnutrition 11. BPH s/p TURP 12. CKD V 13. Physical deconditioning   Discharge Medications: Allergies as of 11/25/2019   No Known Allergies     Medication List    STOP taking these medications   furosemide 80 MG tablet Commonly known as: LASIX   isosorbide dinitrate 20 MG tablet Commonly known as: ISORDIL   sodium bicarbonate 650 MG tablet     TAKE these medications   Accu-Chek FastClix Lancets Misc Check blood sugar up to 7 times a week as instructed What changed:   how much to take  how to take this  when to take this   Accu-Chek Guide w/Device Kit 1 each by Does not apply route daily. Check blood sugar as instructed up to 7 times a week What changed: when to take this   aspirin EC 81 MG tablet Take 1 tablet (81 mg total) by mouth daily.   atorvastatin 80 MG tablet Commonly known as: LIPITOR Take 1 tablet (80 mg total) by mouth at bedtime. IM program   clopidogrel 75 MG tablet Commonly known as: PLAVIX Take 1 tablet (75 mg total) by mouth daily. Start taking on: Nov 26, 2019   dorzolamide-timolol 22.3-6.8 MG/ML ophthalmic solution Commonly known as: COSOPT Place 1 drop into the right eye 2 (two) times daily.   ferrous gluconate 324 MG tablet Commonly known as: FERGON Take 324 mg by mouth daily with breakfast.   finasteride 5 MG tablet Commonly known as: PROSCAR Take 5 mg by mouth daily.   glucose blood test strip Commonly known as: Accu-Chek Guide Check blood sugar up  to 7 times a week as instructed What changed:   how much to take  how to take this  when to take this   hydrALAZINE 25 MG tablet Commonly known as: APRESOLINE Take 3 tablets (75 mg total) by mouth 3 (three) times daily.   isosorbide mononitrate 60 MG 24 hr tablet Commonly known as: IMDUR Take 1 tablet (60 mg total) by mouth daily. Start taking on: Nov 26, 2019   metoprolol succinate 50 MG 24 hr tablet Commonly known as: TOPROL-XL Take 1 tablet (50 mg total) by mouth daily. Take with or immediately following a meal.   tamsulosin 0.4 MG Caps capsule Commonly known as: FLOMAX Take 0.4 mg by mouth daily after supper.   vitamin B-12 1000 MCG tablet Commonly known as: CYANOCOBALAMIN Take 1,000 mcg by mouth daily.       Disposition and follow-up:   Dennis Macias was discharged from [facility] in Stable condition.  At the hospital follow up visit please address:  Syncope. Etiology remains unclear. Arrhythmia vs infection. Underwent cardiac evaluation on admission. See more below. No further episodes since admission.  Pseudomonas bacteremia and UTI. Resolved. Underwent 7d course of abx treatment with cefepime and ceftazidime.  Cefepime neurotoxicity-resolved.  Acute CVA in parietal region. While working up encephalopathy, obtained brain MRI which revealed an infarct in right parietal region. No deficits appreciated from it.  --New medication: plavix 22m daily  --continue aspirin 851mdaily  NSTEMI--type  I vs type II. Troponins on admission 1618>2400. Unclear if this caused the decompensated heart failure vs type II NSTEMI 2/2 infection, volume overload, etc.  Decompensated heart failure. BNP 4000 on admission.  Non obstructive CAD. Underwent LHC 5/13 which revealed non-obstructive LAD disease.  Poorly controlled hypertension. Blood pressures became normotensive throughout his hospitalization. On amlodipine 63m and coreg 215mbid over hospitalization. On 50101mmetoprolol at home. --resume home dose of 27m97mtoprolol succinate, hydralazine 75mg21m, 81mg 67min, lipitor 80mg  26mw medications:  imdur 60mg da52m--home dose of 80mg las72meld on discharge --follow up with cardiology at discharge --assess volume status at time of follow up  Severe Protein calorie malnutrition.  --Continue to encourage increased caloric intake.  BPH s/p TURP.  --Continue finasteride and flomax.  CKD V. Previously stage 4 prior to admission but has progressed. Bicarbs running in the mid 20s this admission. [facility]. -home dose of sodium bicarb 627mg bid 60montinued  Physical deconditioning. Deconditioned at baseline however recent and numerous illnesses resulted in worsened mobility. Will be admitted to CIR at discharge for further rehabilitation.  Follow-up Appointments: Follow-up Information    Varanasi, Jettie Boozew up.   Specialties: Cardiology, Radiology, Interventional Cardiology Why: An appointment has been scheduled for you with Dr. [facility] 2019 at 3:20 PM (Arrive by 3:05 PM). Contact information: [id number] [phone]           Hospital Course by problem list: Syncope The patient was admitted here on 04/30 when he sat down to eat breakfast, and the next thing he knew his girlfriend was calling paramedics. He does not recall exactly what happened, but the event was witnessed by his girlfriend. He states that he did not feel lightheaded, dizzy, or nauseous before the episode.  The patient also states that his hydralazine regimen was increased the day before his presentation. Potential etiologies that were investigated were arrhythmia, CAD, PE, seizure, vagal syncope, recent medication increase. Therefore, cardiology was consulted on the same day. Cardiology was most concerned for  arrhythmia given nonsustained ventricular tachycardia and PVCs on telemetry. Cardiac catheterization was recommended, however patient's CKD and AMS made it difficult, so the plan was to hold off until stable. Once stable, the patient underwent cardiac catheterization on 05/13. Results demonstrated mid LAD lesion as well as ostial LAD lesion. Given complicated nature of his lesion and lack of symptoms related to chest pain, he did not undergo PCI at that time. Plan was for continued medical therapy with the recommendation for VUS with possible atherectomy in the future. He will follow-up with Cardiology in the outpatient setting.   Acute Ischemic Stroke Cefepime induced encephalopathy Patient has a history of prior CVA. On 05/05, he became confused during AM rounds. Upon reevaluation in the early afternoon, patient had an unremarkable neurologic exam, and a stat head CT without contrast showed no abnormalities. On 05/06, cardiology evaluated the patient and called a code stroke. This was called off as the patient's physical exam was reassuring. On 05/07, we ordered an MRI which showed an ischemic infarct in the left parietal region. Neurology was consulted for stroke workup. No significant stenoses on carotid duplex US. He wasKoreahortly afterwards transitioned to Plavix regimen given his history of episodes of hematuria on ASA therapy. In regards to his altered mental status, this seemed most likely due to recent  cefepime use for treatment of his UTI. Neurology also feels that this contributed to his AMS rather than his new ischemic stroke. EEG showed moderate diffuse encephalopathy. Given that an adverse effect of cefepime is confusion, he was transitioned to ceftazidime on 05/06. Other management included discontinuing his LR and giving the patient bicarb through his peripheral line.  His mental status improved significantly over the weekend of May 8th-May 9th. He will follow-up with Neurology in the  outpatient setting.  UTI BPH s/p TURP Over the weekend of May 1-May 2, the patient developed fevers, chills, and some diaphoresis. Urine cultures and 1.4 blood cultures were positive for Pseudomonas species sensitive to cefepime and ceftazidime. The exact etiology was unclear, though his indwelling catheter could have been a potential source of infection. He was placed on cefepime on 05/02, which was continued until the patient experienced an episode of AMS as described above. At that time, he was transitioned to ceftazidime, and this was completed on 05/08 (7 days total of antibiotics).  CKD Stage IV Patient's baseline Cr is 3.5. Cardiology recommended cardiac catheterization, but this was held initially given elevated creatinine levels (levels rose to max of 4.34 early on). Patient was hydrated and taken for cardiac cath on 5/13 that showed PCI of the proximal - ostial LAD would likely require IVUS with possible atherectomy, given no anginal symptoms PCI was not done.  Discharge Vitals:   BP 134/63   Pulse 67   Temp 97.7 F (36.5 C) (Oral)   Resp 20   Ht 6' (1.829 m)   Wt 77.5 kg   SpO2 98%   BMI 23.17 kg/m   Pertinent Labs, Studies, and Procedures:  CBC Latest Ref Rng & Units 11/23/2019 11/19/2019 11/18/2019  WBC 4.0 - 10.5 K/uL 5.3 5.5 6.3  Hemoglobin 13.0 - 17.0 g/dL 7.9(L) 8.3(L) 9.3(L)  Hematocrit 39.0 - 52.0 % 25.1(L) 26.3(L) 28.9(L)  Platelets 150 - 400 K/uL 200 250 283   BMP Latest Ref Rng & Units 11/25/2019 11/24/2019 11/23/2019  Glucose 70 - 99 mg/dL 218(H) 280(H) 210(H)  BUN 8 - 23 mg/dL 66(H) 54(H) 48(H)  Creatinine 0.61 - 1.24 mg/dL 4.09(H) 4.07(H) 3.99(H)  BUN/Creat Ratio 10 - 24 - - -  Sodium 135 - 145 mmol/L 136 139 138  Potassium 3.5 - 5.1 mmol/L 5.0 4.7 4.7  Chloride 98 - 111 mmol/L 104 106 109  CO2 22 - 32 mmol/L _0 Calcium 8.9 - 10.3 mg/dL 8.6(L) 8.6(L) 8.3(L)   5/1 CXR>new small to moderate bilatearl effusions and associated atelectasis/effusion.  Cardiomegaly.  5/5 CT head>no acute intracranial abnormality. Chronic microvascular changes 5/6 MRI brain>71m acute infarct in the left parietal lobe 5/7 MRA brain>no proximal intracranial vessel occlusion. Unchanged tiny vascular protrusion from ACA which may reflect tiny aneurysm  5/7 carotid US>no significant findings 5/13 LHC>mid LAD 75% stenosed, ost LAD to prox LAD 80% stenosed. Normal LVEDP  Discharge Instructions: Follow up with PCP after discharge from CIR   Signed: RMitzi Hansen MD 11/25/19  2:28 PM

## 2019-11-22 NOTE — Progress Notes (Addendum)
Subjective: Patient reports that he is doing well today, no acute events overnight. He denies any chest pain, lightheadedness, dizziness, or other symptoms.   Objective:  Vital signs in last 24 hours: Vitals:   11/21/19 1622 11/21/19 2049 11/22/19 0328 11/22/19 0835  BP: 130/64 125/61 (!) 109/58 (!) 150/66  Pulse: 60 72 62   Resp: 10 14 13 14   Temp: 98.1 F (36.7 C) 98.2 F (36.8 C) 99.1 F (37.3 C)   TempSrc: Oral Oral Oral   SpO2: 98% 99% 99%   Weight:   72.8 kg   Height:       General: Elderly male, no acute distress, lying in bed Cardiac: Regular rate and rhythm, no murmurs rubs or gallops Pulmonary: CTA BL, no wheezing, rhonchi, Rales Abdomen: Soft, nondistended, nontender active bowel sounds Neuro: Awake, alert and oriented x3, moves all extremities, no obvious focal neurological deficits  Assessment/Plan:  Active Problems:   History of stroke   Syncope and collapse   Syncope   Chronic combined systolic and diastolic heart failure (HCC)   Non-STEMI (non-ST elevated myocardial infarction) (HCC)   Encephalopathies   Acute ischemic left ICA stroke (HCC)   Bacteremia   Hypertensive urgency   Gait disorder   Protein-calorie malnutrition, severe  Dennis Macias is a 69 y.o. male with a history of CAD, chronic systolic and diastolic heart failure, CKD stage IV, anemia of chronic disease, and BPH with chronic indwelling Foley catheter status post TURP who presented with a syncopal episode that occurred on 11/06/19, now with acute onset confusion since 05/05, with recent MRI consistent with ischemic infarct of theleftparietal region.  Syncope: Unclear etiology, arrhythmia versus CAD versus infection.  Noted to have elevated troponins of 1600 > 2300 >2200. Cardiac cath showed mid LAD lesion as well as ostial LAD lesion.Given complicated nature of his lesion and lack of symptoms related to chest pain, he did not undergo PCI at that time.  May require IVUS with possible  atherectomy in the future.  No evidence of arrhythmias on telemetry.  No recurrence of symptoms during admission.  -Continue aspirin, statin, and Coreg -Cardiology signed off, plan for follow up outpatient -Continue telemetry  Cefepime induced encephalopathy: Acute CVA: Patient noted to be encephalopathic during admission on 5/5, obtain head CT that was negative.  Continued to have worsening confusion on 5/6 in code stroke was called.  MRI obtained on 5/7 showed ischemic infarct in left parietal region.  Neurology was following, acute encephalopathy thought to be secondary to cefepime, improved with holding cefepime.  Is currently on Plavix for his acute CVA, of note he has had history of hematuria in the past while being on dual antiplatelet therapy.  We will continue to monitor for this.  Today patient is alert and oriented, no significant acute neurological deficits at this time.  PT and OT were recommending CIR, patient has not been working with PT or OT, CIR that if he does not work with PT and OT then he may benefit more from a SNF on discharge.  Discussed with patient the need to work with PT and OT to figure out discharge planning, he is agreeable.  Will reach out to PT and OT to reevaluate him today.  -PT/OT/SLP, appreciate recommendations -Continue Plavix 75 mg daily -We will need follow-up with neurology outpatient -Consider outpatient 30-day event monitor to evaluate for paroxysmal A. Fib -CIR versus SNF on discharge  UTI: BPH s/p TURP: Pseudomonas, 1/4 blood cultures positive for pseudomonas. Completed 7  day course of antibiotics.  Initially with cefepime however the transition to ceftazidime to complete 7-day course.  Foley catheter removed with no issues. Resolved.   CKD stage IV: -Baseline creatinine 3.5, creatinine today is 4.10.  He received contrast for his cardiac catheterization, monitoring for contrast-induced injury.  Patient would need goals of care discussion before  starting dialysis if he were to require it. -Neurology following.  Appreciate recommendations. -Trend renal function panel  Hypertension Recent BP of 109/58 this AM. Decreased hydralazine to 75 mg PO TID today in the setting of recent hypotension.  We will otherwise continue antihypertensive regimen with imdur, 60 mg PO qd, coreg 25 mg bid, hydral75 tid, amlodipine 5 mg po qdfor a goal SBP of 160s-180s. Will slowly begin to titrate to lower BP.  Malnutrition: -Liberalized his diet to regular diet on 05/14, noted to have severe malnutrition from his chronic illnesses.   -Nutrition on board, appreciate recommendations.  Prior to Admission Living Arrangement: Home  Anticipated Discharge Location: TBD, CIR versus SNF Barriers to Discharge: Pending PT and OT evaluation, no further medical workup Dispo: Anticipated discharge in approximately 2-3 day(s).   Orvis Brill, Medical Student 11/22/2019, 8:43 AM Pager: [phone]   I have seen and examined the patient, and reviewed the daily progress note by Orvis Brill, MS 4 and discussed the care of the patient with them. I reviewed the note and have made any corrections that were needed.   Signed:  [name], MD 11/22/2019, 3:54 PM

## 2019-11-23 LAB — RENAL FUNCTION PANEL
Albumin: 1.9 g/dL — ABNORMAL LOW (ref 3.5–5.0)
Anion gap: 5 (ref 5–15)
BUN: 48 mg/dL — ABNORMAL HIGH (ref 8–23)
CO2: 24 mmol/L (ref 22–32)
Calcium: 8.3 mg/dL — ABNORMAL LOW (ref 8.9–10.3)
Chloride: 109 mmol/L (ref 98–111)
Creatinine, Ser: 3.99 mg/dL — ABNORMAL HIGH (ref 0.61–1.24)
GFR calc Af Amer: 17 mL/min — ABNORMAL LOW (ref >60)
GFR calc non Af Amer: 14 mL/min — ABNORMAL LOW (ref >60)
Glucose, Bld: 210 mg/dL — ABNORMAL HIGH (ref 70–99)
Phosphorus: 3.5 mg/dL (ref 2.5–4.6)
Potassium: 4.7 mmol/L (ref 3.5–5.1)
Sodium: 138 mmol/L (ref 135–145)

## 2019-11-23 LAB — CBC
HCT: 25.1 % — ABNORMAL LOW (ref 39.0–52.0)
Hemoglobin: 7.9 g/dL — ABNORMAL LOW (ref 13.0–17.0)
MCH: 29.3 pg (ref 26.0–34.0)
MCHC: 31.5 g/dL (ref 30.0–36.0)
MCV: 93 fL (ref 80.0–100.0)
Platelets: 200 10*3/uL (ref 150–400)
RBC: 2.7 MIL/uL — ABNORMAL LOW (ref 4.22–5.81)
RDW: 14.5 % (ref 11.5–15.5)
WBC: 5.3 10*3/uL (ref 4.0–10.5)
nRBC: 0 % (ref 0.0–0.2)

## 2019-11-23 MED ORDER — DARBEPOETIN ALFA 100 MCG/0.5ML IJ SOSY
100.0000 ug | PREFILLED_SYRINGE | INTRAMUSCULAR | Status: DC
  Administered 2019-11-24: 100 ug via SUBCUTANEOUS
  Filled 2019-11-23: qty 0.5

## 2019-11-23 NOTE — Progress Notes (Signed)
Occupational Therapy Treatment Patient Details Name: Cambridge Deleo MRN: [id number] DOB: 30-Jun-1951 Today's Date: 11/23/2019    History of present illness Pt is a 69 y.o. male with a PMHx of CAD, ischemic cardiomyopathy, chronic systolic and diastolic heart failure, CKD stage IV, anemia of chronic disease, and BPH with chronic indwelling foley catheter s/p TURP 10/20/19. Came to ED after syncopal episode found to be hypertensive and febrile. Admitted 11/06/19. Plan for cardiac cath however creatinine levels elevated. Cardiology recommends a left heart cath eventually; will hold off for now given acutely altered mental status. Of note, pt with new AMS and a code stroke was called on 5/6. MRI of the brain revealed ischemic infarct in the left parietal region.   OT comments  This 69 yo male seen today to look at mobility and basic ADLs. Pt up in recliner, awake and alert eating lunch stating he was ready to go home. He was able to cross one leg over the other while seated in recliner to doff/donn socks, he was able to stand with min A from recliner (lower surface) and ambulate around bed with min A with RW, he was able to go from sit to supine with min guard A. Prior to admission pt was totally independent not having to use an AD for ambulation and doing all of his basic ADLs at an independent level, thus he could benefit from an inpatient rehab stay to get him back to his PLOF before returning home with family.  Follow Up Recommendations  CIR;Supervision/Assistance - 24 hour    Equipment Recommendations  None recommended by OT       Precautions / Restrictions Precautions Precautions: Fall Restrictions Weight Bearing Restrictions: No       Mobility Bed Mobility Overal bed mobility: Needs Assistance Bed Mobility: Sit to Supine     Sit to supine: Min guard    Transfers Overall transfer level: Needs assistance Equipment used: Rolling walker (2 wheeled) Transfers: Sit to/from Stand Sit to  Stand: Min assist Stand pivot transfers: Min assist          Balance Overall balance assessment: Needs assistance Sitting-balance support: No upper extremity supported;Feet supported Sitting balance-Leahy Scale: Good     Standing balance support: Bilateral upper extremity supported;During functional activity Standing balance-Leahy Scale: Poor Standing balance comment: reliant on RW                           ADL either performed or assessed with clinical judgement   ADL Overall ADL's : Needs assistance/impaired                     Lower Body Dressing: Supervision/safety;Set up Lower Body Dressing Details (indicate cue type and reason): Min A sit<stand from recliner (lower surface) Toilet Transfer: Minimal assistance;Ambulation;RW Toilet Transfer Details (indicate cue type and reason): recliner around to bed                 Vision Baseline Vision/History: Wears glasses;Glaucoma;Cataracts Wears Glasses: Reading only Patient Visual Report: No change from baseline            Cognition Arousal/Alertness: Awake/alert Behavior During Therapy: Flat affect Overall Cognitive Status: Impaired/Different from baseline Area of Impairment: Safety/judgement                       Safety/Judgement: Decreased awareness of safety;Decreased awareness of deficits  Pertinent Vitals/ Pain       Pain Assessment: No/denies pain     Prior Functioning/Environment              Frequency  Min 2X/week        Progress Toward Goals  OT Goals(current goals can now be found in the care plan section)  Progress towards OT goals: Progressing toward goals  Acute Rehab OT Goals Patient Stated Goal: not stated  Plan Discharge plan remains appropriate       AM-PAC OT "6 Clicks" Daily Activity     Outcome Measure   Help from another person eating meals?: None Help from another person taking care of personal grooming?: A  Little Help from another person toileting, which includes using toliet, bedpan, or urinal?: A Little Help from another person bathing (including washing, rinsing, drying)?: A Little Help from another person to put on and taking off regular upper body clothing?: A Little Help from another person to put on and taking off regular lower body clothing?: A Little 6 Click Score: 19    End of Session Equipment Utilized During Treatment: Gait belt;Rolling walker  OT Visit Diagnosis: Unsteadiness on feet (R26.81);Muscle weakness (generalized) (M62.81)   Activity Tolerance Patient tolerated treatment well   Patient Left in bed;with call bell/phone within reach;with bed alarm set;with family/visitor present   Nurse Communication          Time: 1237-1300 OT Time Calculation (min): 23 min  Charges: OT General Charges $OT Visit: 1 Visit OT Treatments $Self Care/Home Management : 8-22 mins  [address]en Circle, OTR/L Acute [employer] Pager [phone] Office [phone]      Shrey, Boike 11/23/2019, 1:22 PM

## 2019-11-23 NOTE — Plan of Care (Signed)

## 2019-11-23 NOTE — Progress Notes (Signed)
TR band removed from right radial. Site is level 0. Gauze and Tegaderm placed at site. Pt educated to leave in place for 24 hrs. Pt verbalized understanding.

## 2019-11-23 NOTE — Progress Notes (Signed)
Subjective: Patient reports that he is doing well today, no acute events overnight. He denies any chest pain, lightheadedness, dizziness, or other symptoms.   Objective:  Vital signs in last 24 hours: Vitals:   11/22/19 1629 11/22/19 2035 11/23/19 0431 11/23/19 0827  BP: (!) 143/63 (!) 142/62 (!) 145/63 (!) 121/59  Pulse:  64 67 63  Resp:    14  Temp:  98.4 F (36.9 C) 98.3 F (36.8 C) 97.9 F (36.6 C)  TempSrc:  Oral Oral Axillary  SpO2:  98% 96% 99%  Weight:   76.9 kg   Height:       General: Elderly male, no acute distress, lying in bed Cardiac: Regular rate and rhythm, no murmurs rubs or gallops Pulmonary: CTA BL, no wheezing, rhonchi, Rales Abdomen: Soft, nondistended, nontender active bowel sounds Neuro: Awake, alert and oriented x3, moves all extremities, no obvious focal neurological deficits  Assessment/Plan:  Active Problems:   History of stroke   Syncope and collapse   Syncope   Chronic combined systolic and diastolic heart failure (HCC)   Non-STEMI (non-ST elevated myocardial infarction) (HCC)   Encephalopathies   Acute ischemic left ICA stroke (HCC)   Bacteremia   Hypertensive urgency   Gait disorder   Protein-calorie malnutrition, severe  Dennis Macias is a 69 y.o. male with a history of CAD, chronic systolic and diastolic heart failure, CKD stage IV, anemia of chronic disease, and BPH with chronic indwelling Foley catheter status post TURP who presented with a syncopal episode that occurred on 11/06/19, with resolved acute onset confusion which started 05/05, with recent MRI consistent with ischemic infarct of theleftparietal region.  Syncope: Unclear etiology, arrhythmia versus CAD versus infection.  Noted to have elevated troponins of 1600 > 2300 >2200. Cardiac cath showed mid LAD lesion as well as ostial LAD lesion.Given complicated nature of his lesion and lack of symptoms related to chest pain, he did not undergo PCI at that time.  May require IVUS  with possible atherectomy in the future.  No evidence of arrhythmias on telemetry.  No recurrence of symptoms during admission.  -Continue aspirin, statin, and Coreg -Cardiology signed off, plan for follow up outpatient -Continue telemetry  Cefepime induced encephalopathy: Acute CVA: Patient noted to be encephalopathic during admission on 5/5, obtain head CT that was negative.  Continued to have worsening confusion on 5/6 in code stroke was called.  MRI obtained on 5/7 showed ischemic infarct in left parietal region.  Neurology was following, acute encephalopathy thought to be secondary to cefepime, improved with holding cefepime.  Is currently on Plavix for his acute CVA, of note he has had history of hematuria in the past while being on dual antiplatelet therapy.  We will continue to monitor for this. Patient is now agreeable to evaluation by PT/OT. He was evaluated today with their recommendation for CIR on discharge.  -PT/OT/SLP, appreciate recommendations -Continue Plavix 75 mg daily -We will need follow-up with neurology outpatient -Consider outpatient 30-day event monitor to evaluate for paroxysmal A. Fib - PT/OT recommending CIR  UTI: BPH s/p TURP: Pseudomonas, 1/4 blood cultures positive for pseudomonas. Completed 7 day course of antibiotics.  Initially with cefepime however the transition to ceftazidime to complete 7-day course. Foley catheter removed with no issues. Resolved.   CKD stage IV: -Baseline creatinine 3.5, creatinine today is 3.99.  He received contrast for his cardiac catheterization, monitoring for contrast-induced injury.  Patient would need goals of care discussion before starting dialysis if he were to  require it. -Neurology following. Appreciate recommendations. -Trend renal function panel  Hypertension We will continue antihypertensive regimen with imdur, 60 mg PO qd, coreg 25 mg bid, hydral75 tid, amlodipine 5 mg po qdfor a goal SBP of 160s-180s. Will  continue to slowly titrate to lower BP.  Malnutrition: -Liberalized his diet to regular diet on 05/14, noted to have severe malnutrition from his chronic illnesses.   -Nutrition on board, appreciate recommendations.  Prior to Admission Living Arrangement: Home  Anticipated Discharge Location: PT/OT recommending CIR Barriers to Discharge: Pending CIR placement. No further medical workup Dispo: Anticipated discharge in approximately 2-3 day(s).   Orvis Brill, Medical Student 11/23/2019, 11:49 AM Pager: [phone]

## 2019-11-23 NOTE — Progress Notes (Signed)
Physical Therapy Treatment Patient Details Name: Dennis Macias MRN: [id number] DOB: 1950/07/13 Today's Date: 11/23/2019    History of Present Illness Pt is a 69 y.o. male with a PMHx of CAD, ischemic cardiomyopathy, chronic systolic and diastolic heart failure, CKD stage IV, anemia of chronic disease, and BPH with chronic indwelling foley catheter s/p TURP 10/20/19. Came to ED after syncopal episode found to be hypertensive and febrile. Admitted 11/06/19. Plan for cardiac cath however creatinine levels elevated. Cardiology recommends a left heart cath eventually; will hold off for now given acutely altered mental status. Of note, pt with new AMS and a code stroke was called on 5/6. MRI of the brain revealed ischemic infarct in the left parietal region.    PT Comments    Pt received in bed, alert and pleasant. Agreeable to participate in therapy. He required min assist bed mobility, min assist sit to stand and min assist +2 safety ambulation 25' with RW. Pt performed LE exercises in recliner. Notably improved cognition. Little to no engagement in conversation but answering questions with short, appropriate responses. Pt seated in recliner with feet elevated at end of session.    Follow Up Recommendations  CIR;Supervision/Assistance - 24 hour     Equipment Recommendations  Other (comment)(defer to next venue of care)    Recommendations for Other Services Rehab consult     Precautions / Restrictions Precautions Precautions: Fall Restrictions Weight Bearing Restrictions: No    Mobility  Bed Mobility Overal bed mobility: Needs Assistance Bed Mobility: Supine to Sit     Supine to sit: Min assist;HOB elevated     General bed mobility comments: +rail, increased time, cues for sequencing  Transfers Overall transfer level: Needs assistance Equipment used: Rolling walker (2 wheeled) Transfers: Sit to/from [facility] Sit to Stand: Min assist;From elevated surface Stand  pivot transfers: Min assist       General transfer comment: cues for hand placement, assist to power up and stabilize balance  Ambulation/Gait Ambulation/Gait assistance: Min assist;+2 safety/equipment Gait Distance (Feet): 25 Feet Assistive device: Rolling walker (2 wheeled) Gait Pattern/deviations: Step-through pattern;Trunk flexed;Decreased stride length Gait velocity: decreased Gait velocity interpretation: 1.31 - 2.62 ft/sec, indicative of limited community ambulator General Gait Details: flexed posture. Mildly unsteady. Cues for sequencing/safety. Chair follow for safety.   Stairs             Wheelchair Mobility    Modified Rankin (Stroke Patients Only) Modified Rankin (Stroke Patients Only) Pre-Morbid Rankin Score: Moderate disability Modified Rankin: Moderately severe disability     Balance Overall balance assessment: Needs assistance Sitting-balance support: Feet supported;No upper extremity supported Sitting balance-Leahy Scale: Good     Standing balance support: Bilateral upper extremity supported;During functional activity Standing balance-Leahy Scale: Poor Standing balance comment: reliant on external support                            Cognition Arousal/Alertness: Awake/alert Behavior During Therapy: Flat affect Overall Cognitive Status: Impaired/Different from baseline Area of Impairment: Following commands;Safety/judgement;Problem solving                       Following Commands: Follows one step commands with increased time Safety/Judgement: Decreased awareness of safety   Problem Solving: Slow processing;Difficulty sequencing;Requires verbal cues General Comments: Improved mentation. Oriented x 3. Appropriate. Slow to respond. Very cooperative and willing to participate in PT today.      Exercises General Exercises - Lower Extremity  Ankle Circles/Pumps: AROM;Both;10 reps;Seated Long Arc Quad: AROM;10  reps;Seated;Left;Right Hip ABduction/ADduction: AROM;Both;10 reps;Supine Straight Leg Raises: AAROM;Right;Left;10 reps;Supine Hip Flexion/Marching: AROM;Right;Left;10 reps;Seated    General Comments        Pertinent Vitals/Pain Pain Assessment: No/denies pain    Home Living                      Prior Function            PT Goals (current goals can now be found in the care plan section) Acute Rehab PT Goals Patient Stated Goal: not stated Progress towards PT goals: Progressing toward goals    Frequency    Min 4X/week      PT Plan Current plan remains appropriate    Co-evaluation              AM-PAC PT "6 Clicks" Mobility   Outcome Measure  Help needed turning from your back to your side while in a flat bed without using bedrails?: A Little Help needed moving from lying on your back to sitting on the side of a flat bed without using bedrails?: A Little Help needed moving to and from a bed to a chair (including a wheelchair)?: A Little Help needed standing up from a chair using your arms (e.g., wheelchair or bedside chair)?: A Little Help needed to walk in hospital room?: A Lot Help needed climbing 3-5 steps with a railing? : A Lot 6 Click Score: 16    End of Session Equipment Utilized During Treatment: Gait belt Activity Tolerance: Patient tolerated treatment well Patient left: in chair;with call bell/phone within reach;with chair alarm set Nurse Communication: Mobility status PT Visit Diagnosis: Other abnormalities of gait and mobility (R26.89)     Time: 2778-2423 PT Time Calculation (min) (ACUTE ONLY): 16 min  Charges:  $Therapeutic Exercise: 8-22 mins                     Lorrin Goodell, PT  Office # [phone] Pager [phone]    Lorriane Shire 11/23/2019, 11:08 AM

## 2019-11-23 NOTE — Progress Notes (Signed)
[facility] Progress Note     Assessment/ Plan:   1. AMS-likely secondary to cefepime.  Greatly improved at this time. 2. New Left parietal infarct seen on MRI -per Neuro, likely due to small vessel disease and not likely to have caused his AMS and speech issues. 3. AKI/CKD stage 4-baseline 3.3-3.7.  Minimal AKI.  Received contrast recently and has had small rise in creatinine likely associated with this but no significant concerns.  Creatinine has started to stabilize fortunately. No indication for RRT. 4. Syncope/NSTEMI- possible arrythmiaor could have been TIA. Cardiology proceeded with cath yesterday with ostial lesion considering PCI but delaying for now given lack of symptoms. 5. CKD stage 4- presumably due to DM and followed by Dr. Hollie Salk in our office and baseline Scr 3.3-3.7.  Not far off  his baseline at this time 6. HTN-blood pressure slightly above goal. Currently receives amlodipine 5 mg daily, carvedilol 25 mg twice daily, hydralazine 100 mg 3 times daily, Imdur 60 mg daily.  Could consider max dose of Norvasc.   7. Fever-  Afebrile. Urine culture + for pseudomonas aeruginosa. abx complete 8. Anemia of CKD-started aranesp 11/10/19 at 40 mg -> incr to 175mcg qtues  Subjective:   No complaints of dyspnea/ CP/ n/v. No events overnight.   Objective:   BP (!) 145/63 (BP Location: Left Arm)   Pulse 67   Temp 98.3 F (36.8 C) (Oral)   Resp 16   Ht 6' (1.829 m)   Wt 76.9 kg   SpO2 96%   BMI 22.99 kg/m   Intake/Output Summary (Last 24 hours) at 11/23/2019 0816 Last data filed at 11/23/2019 0600 Gross per 24 hour  Intake 600 ml  Output 550 ml  Net 50 ml   Weight change: 4.1 kg  Physical Exam: Gen: NAD alert, pleasant CVS: Normal rate, no murmur Resp: Bilateral chest rise, no increased work of breathing Abd: +bs, soft, NT/nD Ext: no edema GU: condom cath  Imaging: No results found.  Labs: BMET Recent Labs  Lab 11/17/19 0248 11/18/19 0501  11/19/19 0817 11/20/19 0517 11/21/19 0406 11/22/19 0356 11/23/19 0319  NA 142 141 142 141 142 141 138  K 3.5 3.9 3.8 4.0 4.1 4.4 4.7  CL 105 109 109 111 110 110 109  CO2 25 24 22 22 23 24 24   GLUCOSE 111* 96 97 166* 163* 169* 210*  BUN 39* 39* 40* 41* 41* 43* 48*  CREATININE 3.56* 3.88* 3.88* 4.01* 3.99* 4.10* 3.99*  CALCIUM 8.9 8.6* 8.3* 8.3* 8.5* 8.5* 8.3*  PHOS 3.1 3.4 3.5 3.7 3.7 3.9 3.5   CBC Recent Labs  Lab 11/17/19 0248 11/18/19 0501 11/19/19 0817 11/23/19 0319  WBC 6.8 6.3 5.5 5.3  HGB 9.2* 9.3* 8.3* 7.9*  HCT 28.5* 28.9* 26.3* 25.1*  MCV 90.2 90.9 92.0 93.0  PLT 281 283 250 200    Medications:    . amLODipine  5 mg Oral Daily  . aspirin  81 mg Oral Daily  . atorvastatin  80 mg Oral QHS  . carvedilol  25 mg Oral BID WC  . clopidogrel  75 mg Oral Daily  . darbepoetin (ARANESP) injection - NON-DIALYSIS  40 mcg Subcutaneous Q Tue-1800  . dorzolamide-timolol  1 drop Right Eye BID  . feeding supplement (ENSURE ENLIVE)  237 mL Oral TID BM  . ferrous gluconate  324 mg Oral Q breakfast  . finasteride  5 mg Oral Daily  . heparin  5,000 Units Subcutaneous Q8H  . hydrALAZINE  75 mg Oral TID  . isosorbide mononitrate  60 mg Oral Daily  . lidocaine  5 mL Intradermal Once  . mouth rinse  15 mL Mouth Rinse BID  . sodium chloride flush  3 mL Intravenous Q12H  . sodium chloride flush  3 mL Intravenous Q12H  . sodium chloride flush  3 mL Intravenous Q12H  . tamsulosin  0.4 mg Oral QPC supper  . vitamin B-12  1,000 mcg Oral Daily      Otelia Santee, MD 11/23/2019, 8:16 AM

## 2019-11-23 NOTE — Progress Notes (Addendum)
Inpatient Rehabilitation-Admissions Coordinator   Met with pt and his son bedside after PT and OT treatment sessions. Pt appears to be cooperative and participatory in their sessions today and is progressing well functionally. Pt states he wants to go home. Son at the bedside states he feels he can manage his assist level at home but does want to discuss it first with the patient's wife. Pt is okay with me speaking to his wife and for her to give the final decision. Will hold off on opening his insurance case until I speak with his wife on her preference. Will follow up.   Raechel Ache, OTR/L  Rehab Admissions Coordinator  [phone] 11/23/2019 1:14 PM  Addendum 11/23/19: pt's significant other would like for [place] to pursue CIR auth. Will begin insurance auth process for possible admit.   Raechel Ache, OTR/L  Rehab Admissions Coordinator  [phone] 11/23/2019 3:55 PM

## 2019-11-23 NOTE — Progress Notes (Signed)
Inpatient Rehabilitation-Admissions Coordinator   Will follow to see how pt does today with therapy as he has been nonparticipatory for a few recent therapy sessions. If pt continues to lack tolerance or participation, will recommend SNF.   Raechel Ache, OTR/L  Rehab Admissions Coordinator  [phone] 11/23/2019 10:36 AM

## 2019-11-24 LAB — RENAL FUNCTION PANEL
Albumin: 1.9 g/dL — ABNORMAL LOW (ref 3.5–5.0)
Anion gap: 8 (ref 5–15)
BUN: 54 mg/dL — ABNORMAL HIGH (ref 8–23)
CO2: 25 mmol/L (ref 22–32)
Calcium: 8.6 mg/dL — ABNORMAL LOW (ref 8.9–10.3)
Chloride: 106 mmol/L (ref 98–111)
Creatinine, Ser: 4.07 mg/dL — ABNORMAL HIGH (ref 0.61–1.24)
GFR calc Af Amer: 16 mL/min — ABNORMAL LOW (ref >60)
GFR calc non Af Amer: 14 mL/min — ABNORMAL LOW (ref >60)
Glucose, Bld: 280 mg/dL — ABNORMAL HIGH (ref 70–99)
Phosphorus: 2.9 mg/dL (ref 2.5–4.6)
Potassium: 4.7 mmol/L (ref 3.5–5.1)
Sodium: 139 mmol/L (ref 135–145)

## 2019-11-24 NOTE — Progress Notes (Signed)
NAME:  Miran Kautzman, MRN:  [id number], DOB:  Sep 16, 1950, LOS: 65 ADMISSION DATE:  11/06/2019   Brief History  69 yo male with heart failure (EF 40-45%, G1DD), CAD, hypertension, CKD 5 who presented to [facility] on 11/06/19 for a syncopal event.  Subjective  No complaints this morning. Notes that he does not wish to go to CIR and would rather just go home. SW notes indicating they discussed this with son who notes he could provide the assistance recommended by therapy however pt's girlfriend would prefer a stay in [place] for strengthening prior to returning home. Discussed that I will come up to room when [name] is here today to further discuss.  Significant Hospital Events   4/30 hospital admission for syncopal event 5/5 worsening encephalopathy. CT  Head negative 5/6 MRI consistent with left parietal infarct. Cefepime stopped 5/9 mental status improving. Likely cefepime neurotoxicity 5/13 LHC 5/18 awaiting SNF  Objective   Blood pressure 123/65, pulse 71, temperature 98.1 F (36.7 C), temperature source Oral, resp. rate 16, height 6' (1.829 m), weight 77.4 kg, SpO2 99 %.     Intake/Output Summary (Last 24 hours) at 11/24/2019 0527 Last data filed at 11/23/2019 2245 Gross per 24 hour  Intake 480 ml  Output -  Net 480 ml   Filed Weights   11/22/19 0328 11/23/19 0431 11/24/19 0419  Weight: 72.8 kg 76.9 kg 77.4 kg    Examination: GENERAL: in no acute distress CARDIAC: RRR PULMONARY: breathing comfortably on room air NEURO: alert and oriented  Consults:  Nephrology Cardiology  Significant Diagnostic Tests:  5/1 CXR>new small to moderate bilatearl effusions and associated atelectasis/effusion. Cardiomegaly.  5/5 CT head>no acute intracranial abnormality. Chronic microvascular changes 5/6 MRI brain>84mm acute infarct in the left parietal lobe 5/7 MRA brain>no proximal intracranial vessel occlusion. Unchanged tiny vascular protrusion from ACA which may reflect tiny aneurysm  5/7  carotid US>no significant findings 5/13 LHC>mid LAD 75% stenosed, ost LAD to prox LAD 80% stenosed. Normal LVEDP   Micro Data:  5/1 blood cultures> pseudomonas 5/1 urine culture>psuedomonas  Antimicrobials:  Cefepime 5/2-5/5 Ceftazidime 5/6>5/8  Labs    CBC Latest Ref Rng & Units 11/23/2019 11/19/2019 11/18/2019  WBC 4.0 - 10.5 K/uL 5.3 5.5 6.3  Hemoglobin 13.0 - 17.0 g/dL 7.9(L) 8.3(L) 9.3(L)  Hematocrit 39.0 - 52.0 % 25.1(L) 26.3(L) 28.9(L)  Platelets 150 - 400 K/uL 200 250 283   BMP Latest Ref Rng & Units 11/24/2019 11/23/2019 11/22/2019  Glucose 70 - 99 mg/dL 280(H) 210(H) 169(H)  BUN 8 - 23 mg/dL 54(H) 48(H) 43(H)  Creatinine 0.61 - 1.24 mg/dL 4.07(H) 3.99(H) 4.10(H)  BUN/Creat Ratio 10 - 24 - - -  Sodium 135 - 145 mmol/L 139 138 141  Potassium 3.5 - 5.1 mmol/L 4.7 4.7 4.4  Chloride 98 - 111 mmol/L 106 109 110  CO2 22 - 32 mmol/L 25 24 24   Calcium 8.9 - 10.3 mg/dL 8.6(L) 8.3(L) 8.5(L)    Summary  69 yo male with heart failure, hypertension, CAD, urinary retention and CKD V who was admitted to [facility] on 4/30 for syncopal workup and was found to have decompensated heart failure with NSTEMI. Cardiology was delaying LHC due to bacteremia and worsened renal function.  He was also found to have pseudomonas bacteremia and UTI which was treated with a 7d course of cefepime which was transitioned to ceftazidime due to the development of cefepime neurotoxicity. During the evaluation for encephalopathy, he was additionally found to have had a small parietal CVA. He  finally underwent a LHC on 5/13 which showed non-obstructive CAD. He is now awaiting insurance authorization for CIR for physical deconditioning that was exacerbated by his extended hospitalization.  Assessment & Plan:  Active Problems:   History of stroke   Syncope and collapse   Syncope   Chronic combined systolic and diastolic heart failure (HCC)   Non-STEMI (non-ST elevated myocardial infarction) ([place])   Encephalopathies    Acute ischemic left ICA stroke (HCC)   Bacteremia   Hypertensive urgency   Gait disorder   Protein-calorie malnutrition, severe  Syncope. Etiology of this remains unclear however suspect he may have had a dysrhythmia. Some episodes of Vtach noted on admission which have since resolved.  Chronic ischemic cardiomyopathy (EF 40-45%, G1DD) NSTEMI. Non-obstructive CAD s/p LHC 5/13.  Hypertension. Blood pressures stable. Plan Continue asprin, statin, coreg, hydralazine, imdur Tele monitoring, daily weights, fluid restriction  CKD V. Management per nephrology. Daily RFP  Anemia of CKD. Receiving ESA per nephrology.  Physical deconditioning. Awaiting insurance authorization for CIR. Continue PT/OT in the meantime.  Infarct of right parietal region. Continue plavix  BPH s/p TURP 1w prior to admission. Follows at [facility] urology. Previously with chronic foley--no longer requiring since TURP. Continue finasteride and flomax  Resolved/stable hospital problems  Cefepime neurotoxicity NSTEMI Hypertensive urgency Pseudomonas bacteremia/UTI- completed 7d course of abx treatment Right parietal CVA Best practice:  CODE STATUS: FULL Diet: renal DVT for prophylaxis: heparin subq Social considerations/Family communication: will update later today Dispo: CIR pending insurance authorization   [name], MD [place] PGY-1 PAGER #: [phone] 11/24/19  5:27 AM

## 2019-11-24 NOTE — Plan of Care (Signed)
  Problem: Education: Goal: Knowledge of General Education information will improve Description: Including pain rating scale, medication(s)/side effects and non-pharmacologic comfort measures Outcome: Progressing   Problem: Activity: Goal: Risk for activity intolerance will decrease Outcome: Progressing   Problem: Nutrition: Goal: Adequate nutrition will be maintained Outcome: Progressing   Problem: Coping: Goal: Level of anxiety will decrease Outcome: Progressing   Problem: Pain Managment: Goal: General experience of comfort will improve Outcome: Progressing   Problem: Education: Goal: Knowledge of secondary prevention will improve Outcome: Progressing Goal: Knowledge of patient specific risk factors addressed and post discharge goals established will improve Outcome: Progressing   Problem: Education: Goal: Knowledge of disease or condition will improve Outcome: Progressing Goal: Knowledge of secondary prevention will improve Outcome: Progressing   Problem: Coping: Goal: Will verbalize positive feelings about self Outcome: Progressing Goal: Will identify appropriate support needs Outcome: Progressing   Problem: Health Behavior/Discharge Planning: Goal: Ability to manage health-related needs will improve Outcome: Progressing   Problem: Self-Care: Goal: Ability to participate in self-care as condition permits will improve Outcome: Progressing   Problem: Nutrition: Goal: Risk of aspiration will decrease Outcome: Progressing Goal: Dietary intake will improve Outcome: Progressing   Problem: Ischemic Stroke/TIA Tissue Perfusion: Goal: Complications of ischemic stroke/TIA will be minimized Outcome: Progressing

## 2019-11-24 NOTE — Plan of Care (Signed)

## 2019-11-24 NOTE — Progress Notes (Signed)
Physical Therapy Treatment Patient Details Name: Dennis Macias MRN: [id number] DOB: [birth year] Today's Date: 11/24/2019    History of Present Illness Pt is a 69 y.o. male with a PMHx of CAD, ischemic cardiomyopathy, chronic systolic and diastolic heart failure, CKD stage IV, anemia of chronic disease, and BPH with chronic indwelling foley catheter s/p TURP 10/20/19. Came to ED after syncopal episode found to be hypertensive and febrile. Admitted 11/06/19. Plan for cardiac cath however creatinine levels elevated. Cardiology recommends a left heart cath eventually; will hold off for now given acutely altered mental status. Of note, pt with new AMS and a code stroke was called on 5/6. MRI of the brain revealed ischemic infarct in the left parietal region.    PT Comments    Pt in bed on entry agreeable to working with therapy and NT to get up and walk and then sit in front of the sink for washing up. Pt is limited in safe mobility by decreased strength and endurance. Pt is currently min guard for bed mobility, min Ax2 for transfers, and min A for ambulation of 25 feet with RW. Pt able to stand at sink for bathing for about 4 minutes. Pt continues to make progress but goals have been down graded due to current level of mobility. D/c plans continue to be appropriate. PT will continue to follow acutely.   Follow Up Recommendations  CIR     Equipment Recommendations  None recommended by PT       Precautions / Restrictions Precautions Precautions: Fall    Mobility  Bed Mobility Overal bed mobility: Needs Assistance Bed Mobility: Supine to Sit     Supine to sit: HOB elevated;Min guard(increased time and effort with use of bedrail )     General bed mobility comments: increased time and effort with heavy bedrail use  Transfers Overall transfer level: Needs assistance Equipment used: Rolling walker (2 wheeled) Transfers: Sit to/from [facility] Sit to Stand: Min assist;+2 physical  assistance         General transfer comment: min Ax2 for power up and steadying at EoB, vc for scooting hips to EoB, min A for power up from recliner in front of sink for wash up   Ambulation/Gait Ambulation/Gait assistance: Min assist Gait Distance (Feet): 25 Feet Assistive device: Rolling walker (2 wheeled) Gait Pattern/deviations: Step-through pattern;Trunk flexed;Decreased stride length Gait velocity: decreased Gait velocity interpretation: <1.8 ft/sec, indicate of risk for recurrent falls General Gait Details: min A for steadying and management of RW, vc for proximity and for navigating RW around obstacles       Modified Rankin (Stroke Patients Only) Modified Rankin (Stroke Patients Only) Pre-Morbid Rankin Score: Moderate disability Modified Rankin: Moderately severe disability     Balance Overall balance assessment: Needs assistance Sitting-balance support: No upper extremity supported;Feet supported Sitting balance-Leahy Scale: Good     Standing balance support: Bilateral upper extremity supported;During functional activity Standing balance-Leahy Scale: Poor Standing balance comment: reliant on RW                            Cognition Arousal/Alertness: Awake/alert Behavior During Therapy: Flat affect Overall Cognitive Status: Impaired/Different from baseline Area of Impairment: Safety/judgement;Problem solving                         Safety/Judgement: Decreased awareness of safety   Problem Solving: Difficulty sequencing;Requires verbal cues General Comments: continues to require increased cuing  for safety with RW not sure whether it is due to difficulty processing or [facility]         General Comments General comments (skin integrity, edema, etc.): VSS during session, assisted NT with standing pt at sink for wash up       Pertinent Vitals/Pain Pain Assessment: No/denies pain           PT Goals (current goals can now be found in the  care plan section) Acute Rehab PT Goals PT Goal Formulation: With patient Time For Goal Achievement: 12/08/19 Potential to Achieve Goals: Good Progress towards PT goals: Progressing toward goals    Frequency    Min 4X/week      PT Plan Current plan remains appropriate       AM-PAC PT "6 Clicks" Mobility   Outcome Measure  Help needed turning from your back to your side while in a flat bed without using bedrails?: A Little Help needed moving from lying on your back to sitting on the side of a flat bed without using bedrails?: A Little Help needed moving to and from a bed to a chair (including a wheelchair)?: A Little Help needed standing up from a chair using your arms (e.g., wheelchair or bedside chair)?: A Little Help needed to walk in hospital room?: A Lot Help needed climbing 3-5 steps with a railing? : A Lot 6 Click Score: 16    End of Session Equipment Utilized During Treatment: Gait belt Activity Tolerance: Patient tolerated treatment well Patient left: in chair;with call bell/phone within reach;with nursing/sitter in room Nurse Communication: Mobility status PT Visit Diagnosis: Other abnormalities of gait and mobility (R26.89);Muscle weakness (generalized) (M62.81);Difficulty in walking, not elsewhere classified (R26.2);Unsteadiness on feet (R26.81)     Time: 4356-8616 PT Time Calculation (min) (ACUTE ONLY): 28 min  Charges:  $Gait Training: 8-22 mins $Therapeutic Activity: 8-22 mins                     [name] B. [name] PT, DPT Acute Rehabilitation Services Pager [phone] Office [phone]    [place] 11/24/2019, 4:30 PM

## 2019-11-24 NOTE — Progress Notes (Signed)
[facility] Progress Note     Assessment/ Plan:   1. AMS-likely secondary to cefepime. Greatly improved at this time. 2. New Left parietal infarct seen on MRI -per Neuro, likely due to small vessel disease and not likely to have caused his AMS and speech issues. 3. AKI/CKD stage 4-baseline 3.3-3.7.Minimal AKI. Received contrast recently and has had small rise in creatinine likely associated with this but no significant concerns. Creatinine has started to stabilize fortunately. No indication for RRT. Hopefully Cr will start to decr; we can always follow up as outpt [facility] as well with Dr. Hollie Salk.  Will sign off at this time; please reconsult as needed.  4. Syncope/NSTEMI- possible arrythmiaor could have been TIA. Cardiology proceeded with cath yesterday with ostial lesion considering PCI but delaying for now given lack of symptoms. 5. HTN-blood pressure slightly above goal. Currently receives amlodipine 5 mg daily, carvedilol 25 mg twice daily, hydralazine 100 mg 3 times daily, Imdur 60 mg daily. Could consider max dose of Norvasc.  6. Fever- Afebrile. Urine culture + for pseudomonas aeruginosa. abx complete 7. Anemia of CKD-started aranesp 11/10/19 at 40 mg -> incr to 16mcg qtues   Subjective:   No complaints of dyspnea/ CP/ n/v. No events overnight. He feels that he's doing well.   Objective:   BP 124/60   Pulse 71   Temp 98.1 F (36.7 C) (Oral)   Resp 14   Ht 6' (1.829 m)   Wt 77.4 kg   SpO2 99%   BMI 23.14 kg/m   Intake/Output Summary (Last 24 hours) at 11/24/2019 1211 Last data filed at 11/24/2019 0420 Gross per 24 hour  Intake 240 ml  Output 600 ml  Net -360 ml   Weight change: 0.5 kg  Physical Exam: Gen: NAD alert, pleasant CVS: Normal rate, no murmur Resp: Bilateral chest rise, no increased work of breathing Abd: +bs, soft, NT/nD Ext: no edema GU: condom cath  Imaging: No results found.  Labs: BMET Recent Labs  Lab  11/18/19 0501 11/19/19 0817 11/20/19 0517 11/21/19 0406 11/22/19 0356 11/23/19 0319 11/24/19 0358  NA 141 142 141 142 141 138 139  K 3.9 3.8 4.0 4.1 4.4 4.7 4.7  CL 109 109 111 110 110 109 106  CO2 24 22 22 23 24 24 25   GLUCOSE 96 97 166* 163* 169* 210* 280*  BUN 39* 40* 41* 41* 43* 48* 54*  CREATININE 3.88* 3.88* 4.01* 3.99* 4.10* 3.99* 4.07*  CALCIUM 8.6* 8.3* 8.3* 8.5* 8.5* 8.3* 8.6*  PHOS 3.4 3.5 3.7 3.7 3.9 3.5 2.9   CBC Recent Labs  Lab 11/18/19 0501 11/19/19 0817 11/23/19 0319  WBC 6.3 5.5 5.3  HGB 9.3* 8.3* 7.9*  HCT 28.9* 26.3* 25.1*  MCV 90.9 92.0 93.0  PLT 283 250 200    Medications:    . amLODipine  5 mg Oral Daily  . aspirin  81 mg Oral Daily  . atorvastatin  80 mg Oral QHS  . carvedilol  25 mg Oral BID WC  . clopidogrel  75 mg Oral Daily  . darbepoetin (ARANESP) injection - NON-DIALYSIS  100 mcg Subcutaneous Q Tue-1800  . dorzolamide-timolol  1 drop Right Eye BID  . feeding supplement (ENSURE ENLIVE)  237 mL Oral TID BM  . ferrous gluconate  324 mg Oral Q breakfast  . finasteride  5 mg Oral Daily  . heparin  5,000 Units Subcutaneous Q8H  . hydrALAZINE  75 mg Oral TID  . isosorbide mononitrate  60 mg  Oral Daily  . lidocaine  5 mL Intradermal Once  . mouth rinse  15 mL Mouth Rinse BID  . sodium chloride flush  3 mL Intravenous Q12H  . sodium chloride flush  3 mL Intravenous Q12H  . sodium chloride flush  3 mL Intravenous Q12H  . tamsulosin  0.4 mg Oral QPC supper  . vitamin B-12  1,000 mcg Oral Daily      Otelia Santee, MD 11/24/2019, 12:11 PM

## 2019-11-24 NOTE — H&P (Signed)
Physical Medicine and Rehabilitation Admission H&P    Chief Complaint  Patient presents with  . Debility due to encephalopathy and     HPI:  Dennis Macias is a 69 year old RH male with history of HTN, T2DM, BPH-s/p TURP with Foley placement, prior CVA, CKD- baseline SCR 3.3-3.7,  recent admission for anasarca s/p thoracocentesis with IV diuresis; who was admitted on 11/06/2019 due to syncope. History taken from chart review and patient due to cognition. He was found to have elevated troponin due to NSTEMI as well as frequent PVCs with NSVT.  UDS negative.  He developed fevers with chills past admission and was started on IV cefepime for bacteremia due to Pseudomonas UTI.  He had progressive lethargy with confusion and on 11/11/19 he was noted to have BUE tremors due to metabolic encephalopathy.  He was found to have global aphasia on 05/06 and MRI/MRA brain done revealing 10 mm left parietal lobe subcortical infarct, chronic left thalamic infarct and advanced small vessel disease.  EEG was suggestive of moderate diffuse nonspecific encephalopathy.  Neurology following for input and felt that a MS and speech issues not due to stroke and that encephalopathy was related to cefepime and antibiotics changed to [place].   Stroke was felt to be an incidental finding due to small vessel disease but due to recurrent stroke and history of cardiomyopathy--Dr. Leonie Man recommended 30-day cardiac event monitor to evaluate for PAF.  Cardiology expressed concerns that ventricular tachycardia in setting of cardiomyopathy could be cause of syncope but cath was placed on hold with recommendations of medical treatment for NSTEMI. Nephrology was consulted for input on acute on chronic renal failure which is resolving with IV fluids for hydration.  He underwent cardiac cath on 5/13 revealing 75% stenosis in mid LAD and 80% stenosis ostial LAD to proximal LAD.  SCr peaked to 4.10 post cath and as patient without chest pain,   medically therapy recommended with recommendations to consider PCI if he progresses to HD.  Anemia of chronic disease has been monitored and Aranesp increased drop in H&H to 7.9/25.1.  Mentation is improving and agitation has resolved.  Verbal output has improved and is tolerating regular textures. Therapy ongoing and patient is showing improvement in participation. CIR recommended due to functional decline.  Please see preadmission assessment from earlier today as well.   Review of Systems  Constitutional: Negative for chills and fever.  HENT: Positive for hearing loss. Negative for tinnitus.   Eyes:       Poor vision in right eye.   Respiratory: Negative for cough and shortness of breath.   Cardiovascular: Negative for chest pain and palpitations.  Gastrointestinal: Negative for abdominal pain, heartburn and nausea.  Genitourinary: Negative for dysuria and urgency.  Musculoskeletal: Negative for back pain and myalgias.  Neurological: Positive for weakness. Negative for dizziness and headaches.  Endo/Heme/Allergies: Negative.   Psychiatric/Behavioral: Negative for memory loss. The patient does not have insomnia.    Past Medical History:  Diagnosis Date  . Anemia   . Arthritis    past hx   . Blindness    right eye  . Cataract    removed both eyes  . Dehydration   . Diabetes ([place])   . Glaucoma   . History of CVA (cerebrovascular accident) 09/13/2015  . History of urinary retention   . Hyperlipidemia   . Hypertension   . Pernicious anemia 02/24/2018  . Stroke [facility])    2017- March  . Syncope 11/2019  .  Tachycardia 08/26/2017  . Tubular adenoma of colon 02/2017  . Weight loss, non-intentional 08/26/2017   10 lbs between 6/18 & 2/19    Past Surgical History:  Procedure Laterality Date  . CATARACT EXTRACTION, BILATERAL    . COLONOSCOPY    . IR THORACENTESIS ASP PLEURAL SPACE W/IMG GUIDE  09/21/2019  . IR THORACENTESIS ASP PLEURAL SPACE W/IMG GUIDE  10/16/2019  . LEFT HEART CATH AND  CORONARY ANGIOGRAPHY N/A 11/19/2019   Procedure: LEFT HEART CATH AND CORONARY ANGIOGRAPHY;  Surgeon: Jettie Booze, MD;  Location: [place] [facility];  Service: Cardiovascular;  Laterality: N/A;  . POLYPECTOMY    . REFRACTIVE SURGERY  10/2017  . TEE WITHOUT CARDIOVERSION N/A 09/14/2015   Procedure: TRANSESOPHAGEAL ECHOCARDIOGRAM (TEE);  Surgeon: Larey Dresser, MD;  Location: [place];  Service: Cardiovascular;  Laterality: N/A;  . TRANSURETHRAL RESECTION OF PROSTATE N/A 10/20/2019   Procedure: TRANSURETHRAL RESECTION OF THE PROSTATE (TURP);  Surgeon: Irine Seal, MD;  Location: [facility];  Service: Urology;  Laterality: N/A;  . UPPER GASTROINTESTINAL ENDOSCOPY      Family History  Problem Relation Age of Onset  . Hypertension Mother   . Hyperlipidemia Mother   . Hyperlipidemia Father   . Colon cancer Neg Hx   . Colon polyps Neg Hx   . Esophageal cancer Neg Hx   . Rectal cancer Neg Hx   . Stomach cancer Neg Hx     Social History:  Lives with girlfriend. 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 He reports that he has quit smoking. He quit smokeless tobacco use about 41 years ago.  His smokeless tobacco use included chew. He reports that he does not drink alcohol or use drugs.    Allergies: No Known Allergies    Medications Prior to Admission  Medication Sig Dispense Refill  . aspirin EC 81 MG tablet Take 1 tablet (81 mg total) by mouth daily.    Marland Kitchen atorvastatin (LIPITOR) 80 MG tablet Take 1 tablet (80 mg total) by mouth at bedtime. IM program 90 tablet 3  . dorzolamide-timolol (COSOPT) 22.3-6.8 MG/ML ophthalmic solution Place 1 drop into the right eye 2 (two) times daily.   10  . ferrous gluconate (FERGON) 324 MG tablet Take 324 mg by mouth daily with breakfast.    . finasteride (PROSCAR) 5 MG tablet Take 5 mg by mouth daily.    . furosemide (LASIX) 80 MG tablet Take 1 tablet (80 mg total) by mouth daily. 30 tablet 1  . hydrALAZINE (APRESOLINE) 25 MG  tablet Take 3 tablets (75 mg total) by mouth 3 (three) times daily. 270 tablet 0  . isosorbide dinitrate (ISORDIL) 20 MG tablet Take 2 tablets (40 mg total) by mouth 3 (three) times daily. 90 tablet 1  . metoprolol succinate (TOPROL-XL) 50 MG 24 hr tablet Take 1 tablet (50 mg total) by mouth daily. Take with or immediately following a meal. 30 tablet 1  . sodium bicarbonate 650 MG tablet Take 650 mg by mouth 2 (two) times daily.    . tamsulosin (FLOMAX) 0.4 MG CAPS capsule Take 0.4 mg by mouth daily after supper.    . vitamin B-12 (CYANOCOBALAMIN) 1000 MCG tablet Take 1,000 mcg by mouth daily.    Marland Kitchen ACCU-CHEK FASTCLIX LANCETS MISC Check blood sugar up to 7 times a week as instructed (Patient taking differently: 1 each by Other route See admin instructions. Check blood sugar up to 7 times a week as instructed)  102 each 3  . Blood Glucose Monitoring Suppl (ACCU-CHEK GUIDE) w/Device KIT 1 each by Does not apply route daily. Check blood sugar as instructed up to 7 times a week (Patient taking differently: 1 each by Does not apply route See admin instructions. Check blood sugar as instructed up to 7 times a week) 1 kit 0  . glucose blood (ACCU-CHEK GUIDE) test strip Check blood sugar up to 7 times a week as instructed (Patient taking differently: 1 each by Other route See admin instructions. Check blood sugar up to 7 times a week as instructed) 100 each 3    Drug Regimen Review  Drug regimen was reviewed and remains appropriate with no significant issues identified  Home: Home Living Family/patient expects to be discharged to:: Private residence Living Arrangements: Spouse/significant other Available Help at Discharge: Family Type of Home: Apartment Home Access: Stairs to enter Technical brewer of Steps: 6 Entrance Stairs-Rails: Left Home Layout: One level, Able to live on main level with bedroom/bathroom Bathroom Shower/Tub: Chiropodist: Handicapped height Bathroom  Accessibility: Yes Home Equipment: Bedside commode, Wheelchair - manual, Cane - quad, Environmental consultant - 2 wheels Additional Comments: girlfriend works, his children live near by and can come help him PRN   Functional History: Prior Function Level of Independence: Needs assistance Gait / Transfers Assistance Needed: usually ambulates with walker or cane, limited community distances ADL's / Homemaking Assistance Needed: independent with dressing, and sponge baths, girlfriend does cooking and cleaning  Functional Status:  Mobility: Bed Mobility Overal bed mobility: Needs Assistance Bed Mobility: Supine to Sit Rolling: Min guard Sidelying to sit: Min assist, HOB elevated Supine to sit: HOB elevated, Min guard(increased time and effort with use of bedrail ) Sit to supine: Min guard Sit to sidelying: Min guard General bed mobility comments: increased time and effort with heavy bedrail use Transfers Overall transfer level: Needs assistance Equipment used: Rolling walker (2 wheeled) Transfers: Sit to/from Stand, [employer] Transfers Sit to Stand: Min assist, +2 physical assistance Stand pivot transfers: Min assist General transfer comment: min Ax2 for power up and steadying at EoB, vc for scooting hips to EoB, min A for power up from recliner in front of sink for wash up  Ambulation/Gait Ambulation/Gait assistance: Min assist Gait Distance (Feet): 25 Feet Assistive device: Rolling walker (2 wheeled) Gait Pattern/deviations: Step-through pattern, Trunk flexed, Decreased stride length General Gait Details: min A for steadying and management of RW, vc for proximity and for navigating RW around obstacles  Gait velocity: decreased Gait velocity interpretation: <1.8 ft/sec, indicate of risk for recurrent falls    ADL: ADL Overall ADL's : Needs assistance/impaired Eating/Feeding: Sitting, Minimal assistance Eating/Feeding Details (indicate cue type and reason): While seated in bedside chair.  Assist to open containers. Pt utilized right hand, able to hold and load utensils. Pt placed left hand underneath utensil in right as added precaution in case food is dropped. Pt required increased time to complete. Occasional jerking of BUEs.  Grooming: Minimal assistance, Sitting Upper Body Bathing: Moderate assistance, Minimal assistance, Sitting Lower Body Bathing: Maximal assistance, Sitting/lateral leans, Sit to/from stand Upper Body Dressing : Moderate assistance, Sitting Lower Body Dressing: Supervision/safety, Set up Lower Body Dressing Details (indicate cue type and reason): Min A sit<stand from recliner (lower surface) Toilet Transfer: Minimal assistance, Ambulation, RW Toilet Transfer Details (indicate cue type and reason): recliner around to bed Toileting- Clothing Manipulation and Hygiene: Total assistance, Sit to/from stand Toileting - Clothing Manipulation Details (indicate cue type and reason):  To manage clothing and complete peri care following BM.  Functional mobility during ADLs: Minimal assistance, +2 for physical assistance, +2 for safety/equipment, Rolling walker General ADL Comments: Pt tolerated sitting EOB ~10 min with min guard to ensure balance and safety. Pt able to transfer to/from [facility] and to bedside chair with RW and min assist x 2 for balance and safety. Assist to manuever RW.   Cognition: Cognition Overall Cognitive Status: Impaired/Different from baseline Orientation Level: Oriented X4 Cognition Arousal/Alertness: Awake/alert Behavior During Therapy: Flat affect Overall Cognitive Status: Impaired/Different from baseline Area of Impairment: Safety/judgement, Problem solving Following Commands: Follows one step commands with increased time Safety/Judgement: Decreased awareness of safety Problem Solving: Difficulty sequencing, Requires verbal cues General Comments: continues to require increased cuing for safety with RW not sure whether it is due to difficulty  processing or HOH Difficult to assess due to: Impaired communication   Blood pressure 134/63, pulse 67, temperature 97.7 F (36.5 C), temperature source Oral, resp. rate 20, height 6' (1.829 m), weight 77.5 kg, SpO2 98 %. Physical Exam  Nursing note and vitals reviewed. Constitutional: He is oriented to person, place, and time. He appears well-developed and well-nourished.  HENT:  Head: Normocephalic and atraumatic.  Eyes: EOM are normal. Right eye exhibits no discharge. Left eye exhibits no discharge. Right pupil is not round and not reactive.  Neck: No tracheal deviation present. No thyromegaly present.  Respiratory: Effort normal. No stridor. No respiratory distress.  GI: Soft. He exhibits no distension.  Musculoskeletal:     Comments: 1+ pitting edema right forearm.   Neurological: He is alert and oriented to person, place, and time.  Slow to respond --decreased hearing noted.  He was a ble to answer basic orientation questions he is able to follow two-step commands.   He lacks insight/awareness of deficits.  Motor: 4+/5 throughout  Skin: Skin is warm and dry.  Stasis changes bilateral shins.   Psychiatric: He has a normal mood and affect. His speech is normal.    Results for orders placed or performed during the hospital encounter of 11/06/19 (from the past 48 hour(s))  Renal function panel     Status: Abnormal   Collection Time: 11/24/19  3:58 AM  Result Value Ref Range   Sodium 139 135 - 145 mmol/L   Potassium 4.7 3.5 - 5.1 mmol/L   Chloride 106 98 - 111 mmol/L   CO2 25 22 - 32 mmol/L   Glucose, Bld 280 (H) 70 - 99 mg/dL    Comment: Glucose reference range applies only to samples taken after fasting for at least 8 hours.   BUN 54 (H) 8 - 23 mg/dL   Creatinine, Ser 4.07 (H) 0.61 - 1.24 mg/dL   Calcium 8.6 (L) 8.9 - 10.3 mg/dL   Phosphorus 2.9 2.5 - 4.6 mg/dL   Albumin 1.9 (L) 3.5 - 5.0 g/dL   GFR calc non Af Amer 14 (L) >60 mL/min   GFR calc Af Amer 16 (L) >60 mL/min     Anion gap 8 5 - 15    Comment: Performed at [facility] [address], [place], [place] [address]  Renal function panel     Status: Abnormal   Collection Time: 11/25/19  4:04 AM  Result Value Ref Range   Sodium 136 135 - 145 mmol/L   Potassium 5.0 3.5 - 5.1 mmol/L   Chloride 104 98 - 111 mmol/L   CO2 24 22 - 32 mmol/L   Glucose, Bld 218 (  H) 70 - 99 mg/dL    Comment: Glucose reference range applies only to samples taken after fasting for at least 8 hours.   BUN 66 (H) 8 - 23 mg/dL   Creatinine, Ser 4.09 (H) 0.61 - 1.24 mg/dL   Calcium 8.6 (L) 8.9 - 10.3 mg/dL   Phosphorus 2.3 (L) 2.5 - 4.6 mg/dL   Albumin 1.9 (L) 3.5 - 5.0 g/dL   GFR calc non Af Amer 14 (L) >60 mL/min   GFR calc Af Amer 16 (L) >60 mL/min   Anion gap 8 5 - 15    Comment: Performed at [facility] [address], [place], [place] [address]   No results found.     Medical Problem List and Plan: 1.  Deficits with mobility, transfers, self-care secondary to debility with metabolic encephalopathy.  -patient may shower  -ELOS/Goals: 7-10 days/Supervision/Mod I  Admit to CIR 2.  Antithrombotics: -DVT/anticoagulation:  Pharmaceutical: Heparin  -antiplatelet therapy: DAPT 3. Pain Management: Tylenol prn.  4. Mood: LCSW to follow for evaluation and support.   -antipsychotic agents: N/A 5. Neuropsych: This patient is not fully capable of making decisions on his own behalf. 6. Skin/Wound Care: Routine pressure relief measures.  7. Fluids/Electrolytes/Nutrition: Monitor I/Os. Monitor for signs of overload- check daily weights.  Monitor renal status for recovery.  8. Cefipime induced encephalopathy: Improving 9. Acute on chronic renal failure: Followed by Dr. Hollie Salk. SCr-40.9 today.    CMP ordered for tomorrow.  10 HTN: Monitor BP tid--on Norvasc, Imdur, Hydralazine and Coreg.   Monitor with increased mobility 11. NSTEMI/ICM: Has significant LAD --to be treated medically with Coreg, Hydralazine, Imdur,  ASA/Plavix and Lipitor. PCI if patient progresses to ESRD.   Daily weights 12. Pseudomonas UTI: Completed IV antibiotics on 05/08.  13. BPH s/p TURP 4/13: Continue Proscar. Condom cath in place with cloudy urine in tubing. Will check PVRs to monitor voiding. Toilet every 4 hours during the day.    Monitor for retention 14. Anemia of chronic disease: On Aranesp--increased to 116mg/weekly today.   CBC ordered for tomorrow  PBary Leriche PA-C 11/25/2019  I have personally performed a face to face diagnostic evaluation, including, but not limited to relevant history and physical exam findings, of this patient and developed relevant assessment and plan.  Additionally, I have reviewed and concur with the physician assistant's documentation above.  ADelice Lesch MD, ABPMR

## 2019-11-25 ENCOUNTER — Inpatient Hospital Stay (HOSPITAL_COMMUNITY)
Admission: RE | Admit: 2019-11-25 | Discharge: 2019-12-04 | DRG: 945 | Disposition: A | Discharge status: Home Health | Payer: Medicare PPO | Source: Intra-hospital | Attending: Physical Medicine and Rehabilitation | Admitting: Physical Medicine and Rehabilitation

## 2019-11-25 ENCOUNTER — Encounter (HOSPITAL_COMMUNITY): Payer: Self-pay | Admitting: Physical Medicine and Rehabilitation

## 2019-11-25 ENCOUNTER — Other Ambulatory Visit: Payer: Self-pay

## 2019-11-25 DIAGNOSIS — E872 Acidosis: Secondary | ICD-10-CM | POA: Diagnosis not present

## 2019-11-25 DIAGNOSIS — I2510 Atherosclerotic heart disease of native coronary artery without angina pectoris: Secondary | ICD-10-CM | POA: Diagnosis present

## 2019-11-25 DIAGNOSIS — T361X5A Adverse effect of cephalosporins and other beta-lactam antibiotics, initial encounter: Secondary | ICD-10-CM | POA: Diagnosis not present

## 2019-11-25 DIAGNOSIS — Z7982 Long term (current) use of aspirin: Secondary | ICD-10-CM

## 2019-11-25 DIAGNOSIS — G9340 Encephalopathy, unspecified: Secondary | ICD-10-CM

## 2019-11-25 DIAGNOSIS — E1122 Type 2 diabetes mellitus with diabetic chronic kidney disease: Secondary | ICD-10-CM | POA: Diagnosis present

## 2019-11-25 DIAGNOSIS — N179 Acute kidney failure, unspecified: Secondary | ICD-10-CM | POA: Diagnosis present

## 2019-11-25 DIAGNOSIS — I5042 Chronic combined systolic (congestive) and diastolic (congestive) heart failure: Secondary | ICD-10-CM | POA: Diagnosis not present

## 2019-11-25 DIAGNOSIS — E875 Hyperkalemia: Secondary | ICD-10-CM | POA: Diagnosis present

## 2019-11-25 DIAGNOSIS — H5461 Unqualified visual loss, right eye, normal vision left eye: Secondary | ICD-10-CM | POA: Diagnosis present

## 2019-11-25 DIAGNOSIS — Z8249 Family history of ischemic heart disease and other diseases of the circulatory system: Secondary | ICD-10-CM

## 2019-11-25 DIAGNOSIS — Z79899 Other long term (current) drug therapy: Secondary | ICD-10-CM | POA: Diagnosis not present

## 2019-11-25 DIAGNOSIS — G928 Other toxic encephalopathy: Secondary | ICD-10-CM

## 2019-11-25 DIAGNOSIS — Z7902 Long term (current) use of antithrombotics/antiplatelets: Secondary | ICD-10-CM

## 2019-11-25 DIAGNOSIS — N400 Enlarged prostate without lower urinary tract symptoms: Secondary | ICD-10-CM | POA: Diagnosis present

## 2019-11-25 DIAGNOSIS — Z9842 Cataract extraction status, left eye: Secondary | ICD-10-CM

## 2019-11-25 DIAGNOSIS — R55 Syncope and collapse: Secondary | ICD-10-CM | POA: Diagnosis not present

## 2019-11-25 DIAGNOSIS — Y92239 Unspecified place in hospital as the place of occurrence of the external cause: Secondary | ICD-10-CM | POA: Diagnosis present

## 2019-11-25 DIAGNOSIS — Z9079 Acquired absence of other genital organ(s): Secondary | ICD-10-CM | POA: Diagnosis not present

## 2019-11-25 DIAGNOSIS — K5900 Constipation, unspecified: Secondary | ICD-10-CM | POA: Diagnosis present

## 2019-11-25 DIAGNOSIS — I428 Other cardiomyopathies: Secondary | ICD-10-CM | POA: Diagnosis present

## 2019-11-25 DIAGNOSIS — N184 Chronic kidney disease, stage 4 (severe): Secondary | ICD-10-CM | POA: Diagnosis not present

## 2019-11-25 DIAGNOSIS — Z9841 Cataract extraction status, right eye: Secondary | ICD-10-CM | POA: Diagnosis not present

## 2019-11-25 DIAGNOSIS — Z87891 Personal history of nicotine dependence: Secondary | ICD-10-CM

## 2019-11-25 DIAGNOSIS — I214 Non-ST elevation (NSTEMI) myocardial infarction: Secondary | ICD-10-CM | POA: Diagnosis not present

## 2019-11-25 DIAGNOSIS — R079 Chest pain, unspecified: Secondary | ICD-10-CM | POA: Diagnosis not present

## 2019-11-25 DIAGNOSIS — E11319 Type 2 diabetes mellitus with unspecified diabetic retinopathy without macular edema: Secondary | ICD-10-CM | POA: Diagnosis present

## 2019-11-25 DIAGNOSIS — R5381 Other malaise: Secondary | ICD-10-CM | POA: Diagnosis not present

## 2019-11-25 DIAGNOSIS — R41844 Frontal lobe and executive function deficit: Secondary | ICD-10-CM | POA: Diagnosis not present

## 2019-11-25 DIAGNOSIS — N189 Chronic kidney disease, unspecified: Secondary | ICD-10-CM

## 2019-11-25 DIAGNOSIS — N2581 Secondary hyperparathyroidism of renal origin: Secondary | ICD-10-CM | POA: Diagnosis not present

## 2019-11-25 DIAGNOSIS — I493 Ventricular premature depolarization: Secondary | ICD-10-CM | POA: Diagnosis present

## 2019-11-25 DIAGNOSIS — R0602 Shortness of breath: Secondary | ICD-10-CM | POA: Diagnosis not present

## 2019-11-25 DIAGNOSIS — E785 Hyperlipidemia, unspecified: Secondary | ICD-10-CM | POA: Diagnosis present

## 2019-11-25 DIAGNOSIS — H9190 Unspecified hearing loss, unspecified ear: Secondary | ICD-10-CM | POA: Diagnosis present

## 2019-11-25 DIAGNOSIS — Z83438 Family history of other disorder of lipoprotein metabolism and other lipidemia: Secondary | ICD-10-CM

## 2019-11-25 DIAGNOSIS — I472 Ventricular tachycardia: Secondary | ICD-10-CM | POA: Diagnosis present

## 2019-11-25 DIAGNOSIS — Z8673 Personal history of transient ischemic attack (TIA), and cerebral infarction without residual deficits: Secondary | ICD-10-CM

## 2019-11-25 DIAGNOSIS — E1151 Type 2 diabetes mellitus with diabetic peripheral angiopathy without gangrene: Secondary | ICD-10-CM | POA: Diagnosis present

## 2019-11-25 DIAGNOSIS — I878 Other specified disorders of veins: Secondary | ICD-10-CM | POA: Diagnosis present

## 2019-11-25 DIAGNOSIS — N401 Enlarged prostate with lower urinary tract symptoms: Secondary | ICD-10-CM | POA: Diagnosis not present

## 2019-11-25 DIAGNOSIS — I255 Ischemic cardiomyopathy: Secondary | ICD-10-CM | POA: Diagnosis present

## 2019-11-25 DIAGNOSIS — I130 Hypertensive heart and chronic kidney disease with heart failure and stage 1 through stage 4 chronic kidney disease, or unspecified chronic kidney disease: Secondary | ICD-10-CM | POA: Diagnosis not present

## 2019-11-25 DIAGNOSIS — R532 Functional quadriplegia: Secondary | ICD-10-CM | POA: Diagnosis not present

## 2019-11-25 DIAGNOSIS — I10 Essential (primary) hypertension: Secondary | ICD-10-CM

## 2019-11-25 DIAGNOSIS — I5023 Acute on chronic systolic (congestive) heart failure: Secondary | ICD-10-CM | POA: Diagnosis not present

## 2019-11-25 DIAGNOSIS — D631 Anemia in chronic kidney disease: Secondary | ICD-10-CM | POA: Diagnosis present

## 2019-11-25 DIAGNOSIS — G92 Toxic encephalopathy: Secondary | ICD-10-CM | POA: Diagnosis present

## 2019-11-25 DIAGNOSIS — D638 Anemia in other chronic diseases classified elsewhere: Secondary | ICD-10-CM

## 2019-11-25 DIAGNOSIS — N289 Disorder of kidney and ureter, unspecified: Secondary | ICD-10-CM | POA: Diagnosis not present

## 2019-11-25 DIAGNOSIS — G4700 Insomnia, unspecified: Secondary | ICD-10-CM | POA: Diagnosis not present

## 2019-11-25 DIAGNOSIS — E1129 Type 2 diabetes mellitus with other diabetic kidney complication: Secondary | ICD-10-CM | POA: Diagnosis not present

## 2019-11-25 LAB — RENAL FUNCTION PANEL
Albumin: 1.9 g/dL — ABNORMAL LOW (ref 3.5–5.0)
Anion gap: 8 (ref 5–15)
BUN: 66 mg/dL — ABNORMAL HIGH (ref 8–23)
CO2: 24 mmol/L (ref 22–32)
Calcium: 8.6 mg/dL — ABNORMAL LOW (ref 8.9–10.3)
Chloride: 104 mmol/L (ref 98–111)
Creatinine, Ser: 4.09 mg/dL — ABNORMAL HIGH (ref 0.61–1.24)
GFR calc Af Amer: 16 mL/min — ABNORMAL LOW (ref >60)
GFR calc non Af Amer: 14 mL/min — ABNORMAL LOW (ref >60)
Glucose, Bld: 218 mg/dL — ABNORMAL HIGH (ref 70–99)
Phosphorus: 2.3 mg/dL — ABNORMAL LOW (ref 2.5–4.6)
Potassium: 5 mmol/L (ref 3.5–5.1)
Sodium: 136 mmol/L (ref 135–145)

## 2019-11-25 LAB — HEPATIC FUNCTION PANEL
ALT: 11 U/L (ref 0–44)
AST: 13 U/L — ABNORMAL LOW (ref 15–41)
Albumin: 2 g/dL — ABNORMAL LOW (ref 3.5–5.0)
Alkaline Phosphatase: 47 U/L (ref 38–126)
Bilirubin, Direct: <0.1 mg/dL (ref 0.0–0.2)
Total Bilirubin: 0.4 mg/dL (ref 0.3–1.2)
Total Protein: 4.8 g/dL — ABNORMAL LOW (ref 6.5–8.1)

## 2019-11-25 MED ORDER — BISACODYL 10 MG RE SUPP: 10.0000 mg | Freq: Every day | RECTAL | Status: DC | PRN

## 2019-11-25 MED ORDER — CLOPIDOGREL BISULFATE 75 MG PO TABS
75.0000 mg | ORAL_TABLET | Freq: Every day | ORAL | Status: DC
  Administered 2019-11-26 – 2019-12-04 (×9): 75 mg via ORAL
  Filled 2019-11-25 (×9): qty 1

## 2019-11-25 MED ORDER — CARVEDILOL 12.5 MG PO TABS
25.0000 mg | ORAL_TABLET | Freq: Two times a day (BID) | ORAL | Status: DC
  Administered 2019-11-26 – 2019-12-04 (×17): 25 mg via ORAL
  Filled 2019-11-25 (×17): qty 2

## 2019-11-25 MED ORDER — FINASTERIDE 5 MG PO TABS
5.0000 mg | ORAL_TABLET | Freq: Every day | ORAL | Status: DC
  Administered 2019-11-26 – 2019-12-04 (×9): 5 mg via ORAL
  Filled 2019-11-25 (×9): qty 1

## 2019-11-25 MED ORDER — PROCHLORPERAZINE EDISYLATE 10 MG/2ML IJ SOLN: 5.0000 mg | Freq: Four times a day (QID) | INTRAMUSCULAR | Status: DC | PRN

## 2019-11-25 MED ORDER — FLEET ENEMA 7-19 GM/118ML RE ENEM
1.0000 | ENEMA | Freq: Once | RECTAL | Status: DC | PRN
Start: 2019-11-25 — End: 2019-11-25

## 2019-11-25 MED ORDER — DARBEPOETIN ALFA 100 MCG/0.5ML IJ SOSY
100.0000 ug | PREFILLED_SYRINGE | INTRAMUSCULAR | Status: DC
  Administered 2019-12-01: 100 ug via SUBCUTANEOUS
  Filled 2019-11-25: qty 0.5

## 2019-11-25 MED ORDER — MILK AND MOLASSES ENEMA
1.0000 | Freq: Every day | RECTAL | Status: DC | PRN
  Filled 2019-11-25: qty 240

## 2019-11-25 MED ORDER — ALUMINUM HYDROXIDE GEL 320 MG/5ML PO SUSP
10.0000 mL | Freq: Four times a day (QID) | ORAL | Status: DC | PRN
  Filled 2019-11-25: qty 30

## 2019-11-25 MED ORDER — DIPHENHYDRAMINE HCL 12.5 MG/5ML PO ELIX: 12.5000 mg | ORAL_SOLUTION | Freq: Four times a day (QID) | ORAL | Status: DC | PRN

## 2019-11-25 MED ORDER — AMLODIPINE BESYLATE 5 MG PO TABS
5.0000 mg | ORAL_TABLET | Freq: Every day | ORAL | Status: DC
  Administered 2019-11-26 – 2019-12-04 (×9): 5 mg via ORAL
  Filled 2019-11-25 (×9): qty 1

## 2019-11-25 MED ORDER — FERROUS GLUCONATE 324 (38 FE) MG PO TABS
324.0000 mg | ORAL_TABLET | Freq: Every day | ORAL | Status: DC
  Administered 2019-11-26 – 2019-12-04 (×9): 324 mg via ORAL
  Filled 2019-11-25 (×9): qty 1

## 2019-11-25 MED ORDER — PROCHLORPERAZINE MALEATE 5 MG PO TABS: 5.0000 mg | ORAL_TABLET | Freq: Four times a day (QID) | ORAL | Status: DC | PRN

## 2019-11-25 MED ORDER — ISOSORBIDE MONONITRATE ER 60 MG PO TB24: 60.0000 mg | ORAL_TABLET | Freq: Every day | ORAL | 0 refills | Status: DC

## 2019-11-25 MED ORDER — ENSURE ENLIVE PO LIQD
237.0000 mL | Freq: Three times a day (TID) | ORAL | Status: DC
  Administered 2019-11-25 – 2019-12-04 (×25): 237 mL via ORAL

## 2019-11-25 MED ORDER — POLYETHYLENE GLYCOL 3350 17 G PO PACK
17.0000 g | PACK | Freq: Every day | ORAL | Status: DC | PRN
  Administered 2019-11-26 – 2019-12-03 (×2): 17 g via ORAL
  Filled 2019-11-25 (×2): qty 1

## 2019-11-25 MED ORDER — LIDOCAINE HCL URETHRAL/MUCOSAL 2 % EX GEL: 1.0000 "application " | CUTANEOUS | Status: DC | PRN

## 2019-11-25 MED ORDER — CLOPIDOGREL BISULFATE 75 MG PO TABS: 75.0000 mg | ORAL_TABLET | Freq: Every day | ORAL | 1 refills | Status: DC

## 2019-11-25 MED ORDER — HEPARIN SODIUM (PORCINE) 5000 UNIT/ML IJ SOLN
5000.0000 [IU] | Freq: Three times a day (TID) | INTRAMUSCULAR | Status: DC
  Administered 2019-11-25 – 2019-12-04 (×25): 5000 [IU] via SUBCUTANEOUS
  Filled 2019-11-25 (×25): qty 1

## 2019-11-25 MED ORDER — GUAIFENESIN-DM 100-10 MG/5ML PO SYRP: 5.0000 mL | ORAL_SOLUTION | Freq: Four times a day (QID) | ORAL | Status: DC | PRN

## 2019-11-25 MED ORDER — VITAMIN B-12 1000 MCG PO TABS
1000.0000 ug | ORAL_TABLET | Freq: Every day | ORAL | Status: DC
  Administered 2019-11-26 – 2019-12-04 (×9): 1000 ug via ORAL
  Filled 2019-11-25 (×9): qty 1

## 2019-11-25 MED ORDER — HYDRALAZINE HCL 50 MG PO TABS
75.0000 mg | ORAL_TABLET | Freq: Three times a day (TID) | ORAL | Status: DC
  Administered 2019-11-25 – 2019-11-29 (×12): 75 mg via ORAL
  Filled 2019-11-25 (×12): qty 1

## 2019-11-25 MED ORDER — PROCHLORPERAZINE 25 MG RE SUPP: 12.5000 mg | Freq: Four times a day (QID) | RECTAL | Status: DC | PRN

## 2019-11-25 MED ORDER — ASPIRIN 81 MG PO CHEW
81.0000 mg | CHEWABLE_TABLET | Freq: Every day | ORAL | Status: DC
  Administered 2019-11-26 – 2019-12-04 (×9): 81 mg via ORAL
  Filled 2019-11-25 (×9): qty 1

## 2019-11-25 MED ORDER — ATORVASTATIN CALCIUM 80 MG PO TABS
80.0000 mg | ORAL_TABLET | Freq: Every day | ORAL | Status: DC
  Administered 2019-11-25 – 2019-12-03 (×9): 80 mg via ORAL
  Filled 2019-11-25 (×9): qty 1

## 2019-11-25 MED ORDER — ACETAMINOPHEN 325 MG PO TABS
325.0000 mg | ORAL_TABLET | ORAL | Status: DC | PRN
  Administered 2019-11-27: 650 mg via ORAL
  Filled 2019-11-25 (×2): qty 2

## 2019-11-25 MED ORDER — ORAL CARE MOUTH RINSE
15.0000 mL | Freq: Two times a day (BID) | OROMUCOSAL | Status: DC
  Administered 2019-11-25 – 2019-12-04 (×14): 15 mL via OROMUCOSAL

## 2019-11-25 MED ORDER — ALUM & MAG HYDROXIDE-SIMETH 200-200-20 MG/5ML PO SUSP: 30.0000 mL | ORAL | Status: DC | PRN

## 2019-11-25 MED ORDER — ISOSORBIDE MONONITRATE ER 30 MG PO TB24
60.0000 mg | ORAL_TABLET | Freq: Every day | ORAL | Status: DC
  Administered 2019-11-26 – 2019-12-04 (×9): 60 mg via ORAL
  Filled 2019-11-25 (×10): qty 2

## 2019-11-25 MED ORDER — TAMSULOSIN HCL 0.4 MG PO CAPS
0.4000 mg | ORAL_CAPSULE | Freq: Every day | ORAL | Status: DC
  Administered 2019-11-26 – 2019-12-03 (×8): 0.4 mg via ORAL
  Filled 2019-11-25 (×8): qty 1

## 2019-11-25 MED ORDER — DORZOLAMIDE HCL-TIMOLOL MAL 2-0.5 % OP SOLN
1.0000 [drp] | Freq: Two times a day (BID) | OPHTHALMIC | Status: DC
  Administered 2019-11-25 – 2019-12-04 (×18): 1 [drp] via OPHTHALMIC
  Filled 2019-11-25: qty 10

## 2019-11-25 NOTE — Progress Notes (Signed)
NAME:  Rishab Stoudt, MRN:  [id number], DOB:  1951/02/15, LOS: 70 ADMISSION DATE:  11/06/2019   Brief History  69 yo male with heart failure (EF 40-45%, G1DD), CAD, hypertension, CKD 5 who presented to [facility] on 11/06/19 for a syncopal event.  Subjective   No issues this morning, reports that he is feeling fine.  Discussed CIR with Milbert Coulter.  Hesitant to go as he just wants to go home at this time.  Had a discussion between CIR, physical therapy, [place] and his wife, and myself.  After further consideration, [place] is willing to go to CIR today for rehabilitation.  Significant Hospital Events   4/30 hospital admission for syncopal event 5/5 worsening encephalopathy. CT  Head negative 5/6 MRI consistent with left parietal infarct. Cefepime stopped 5/9 mental status improving. Likely cefepime neurotoxicity 5/13 LHC 5/18 awaiting CIR 5/19 discharged to CIR  Objective   Blood pressure (!) 100/48, pulse 67, temperature 97.9 F (36.6 C), temperature source Oral, resp. rate 20, height 6' (1.829 m), weight 77.5 kg, SpO2 99 %.     Intake/Output Summary (Last 24 hours) at 11/25/2019 0551 Last data filed at 11/24/2019 2200 Gross per 24 hour  Intake 240 ml  Output --  Net 240 ml   Filed Weights   11/23/19 0431 11/24/19 0419 11/25/19 0355  Weight: 76.9 kg 77.4 kg 77.5 kg    Examination: GENERAL: in no acute distress CARDIAC: RRR PULMONARY: breathing comfortably on room air NEURO: alert and oriented  Consults:  Nephrology Cardiology  Significant Diagnostic Tests:  5/1 CXR>new small to moderate bilatearl effusions and associated atelectasis/effusion. Cardiomegaly.  5/5 CT head>no acute intracranial abnormality. Chronic microvascular changes 5/6 MRI brain>13mm acute infarct in the left parietal lobe 5/7 MRA brain>no proximal intracranial vessel occlusion. Unchanged tiny vascular protrusion from ACA which may reflect tiny aneurysm  5/7 carotid US>no significant findings 5/13 LHC>mid LAD 75%  stenosed, ost LAD to prox LAD 80% stenosed. Normal LVEDP   Micro Data:  5/1 blood cultures> pseudomonas 5/1 urine culture>psuedomonas  Antimicrobials:  Cefepime 5/2-5/5 Ceftazidime 5/6>5/8  Labs    CBC Latest Ref Rng & Units 11/23/2019 11/19/2019 11/18/2019  WBC 4.0 - 10.5 K/uL 5.3 5.5 6.3  Hemoglobin 13.0 - 17.0 g/dL 7.9(L) 8.3(L) 9.3(L)  Hematocrit 39.0 - 52.0 % 25.1(L) 26.3(L) 28.9(L)  Platelets 150 - 400 K/uL 200 250 283   BMP Latest Ref Rng & Units 11/25/2019 11/24/2019 11/23/2019  Glucose 70 - 99 mg/dL 218(H) 280(H) 210(H)  BUN 8 - 23 mg/dL 66(H) 54(H) 48(H)  Creatinine 0.61 - 1.24 mg/dL 4.09(H) 4.07(H) 3.99(H)  BUN/Creat Ratio 10 - 24 - - -  Sodium 135 - 145 mmol/L 136 139 138  Potassium 3.5 - 5.1 mmol/L 5.0 4.7 4.7  Chloride 98 - 111 mmol/L 104 106 109  CO2 22 - 32 mmol/L 24 25 24   Calcium 8.9 - 10.3 mg/dL 8.6(L) 8.6(L) 8.3(L)    Summary  69 yo male with heart failure, hypertension, CAD, urinary retention and CKD V who was admitted to IMTS on 4/30 for syncopal workup and was found to have decompensated heart failure with NSTEMI. Cardiology was delaying LHC due to bacteremia and worsened renal function.  He was also found to have pseudomonas bacteremia and UTI which was treated with a 7d course of cefepime which was transitioned to ceftazidime due to the development of cefepime neurotoxicity. During the evaluation for encephalopathy, he was additionally found to have had a small parietal CVA. He finally underwent a LHC on 5/13  which showed non-obstructive CAD.  He is being discharged on 5/19 to CIR for further rehabilitation.  Assessment & Plan:  Active Problems:   History of stroke   Syncope and collapse   Syncope   Chronic combined systolic and diastolic heart failure (HCC)   Non-STEMI (non-ST elevated myocardial infarction) ([place])   Encephalopathies   Acute ischemic left ICA stroke (HCC)   Bacteremia   Hypertensive urgency   Gait disorder   Protein-calorie  malnutrition, severe  Syncope. Etiology of this remains unclear however suspect he may have had a dysrhythmia. Some episodes of Vtach noted on admission which have since resolved.  Chronic ischemic cardiomyopathy (EF 40-45%, G1DD) NSTEMI. Non-obstructive CAD s/p LHC 5/13.  Hypertension. Blood pressures stable. Plan Continue asprin, statin, coreg, hydralazine, imdur  CKD V. Management per nephrology. Daily RFP  Anemia of CKD. Receiving ESA per nephrology.  Physical deconditioning.  Discharging to CIR today.  Infarct of right parietal region. Continue plavix  BPH s/p TURP 1w prior to admission. Follows at [facility] urology. Previously with chronic foley--no longer requiring since TURP. Continue finasteride and flomax  Resolved/stable hospital problems  Cefepime neurotoxicity NSTEMI Hypertensive urgency Pseudomonas bacteremia/UTI- completed 7d course of abx treatment Right parietal CVA Best practice:  CODE STATUS: FULL Diet: renal DVT for prophylaxis: heparin subq Social considerations/Family communication: Wife updated at bedside Dispo: Discharged   [name], MD [place] PGY-1 PAGER #: [phone] 11/25/19  5:51 AM

## 2019-11-25 NOTE — Progress Notes (Signed)
Inpatient Rehabilitation-Admissions Coordinator   I have received insurance approval for admit to CIR today. Discussed bed offer with pt and his significant other. They would like to pursue IP Rehab at this time. I have received medical clearance from attending service for admit today. Reviewed insurance benefits letter and consent forms. All questions answered. RN and [facility] team aware of plan for today.   Please call if questions.   Raechel Ache, OTR/L  Rehab Admissions Coordinator  [phone] 11/25/2019 2:43 PM

## 2019-11-25 NOTE — TOC Transition Note (Signed)
Transition of Care [facility]) - CM/SW Discharge Note   Patient Details  Name: Dennis Macias MRN: [id number] Date of Birth: 1951-01-18  [facility] CM/SW Contact:  Bethena Roys, RN Phone Number: 11/25/2019, 2:40 PM   Clinical Narrative:  Insurance has approved the patient for CIR.Patient will transition to inpatient rehab today. Staff RN aware and patient aware of transition. No further needs from Case Manager at this time.     Final next level of care: IP Rehab Facility Barriers to Discharge: Barriers Resolved   Patient Goals and CMS Choice Patient states their goals for this hospitalization and ongoing recovery are:: to return home   Choice offered to / list presented to : NA   Discharge Plan and Services In-house Referral: NA Discharge Planning Services: CM Consult Post Acute Care Choice: Home Health, Resumption of Svcs/PTA Provider(Active with [name] for [facility] RN/PT- OT to be added.)          DME Arranged: N/A     HH Arranged: RN, Disease Management, PT, OT HH Agency: [place] Date [facility] Agency Contacted: 11/10/19 Time HH Agency Contacted: 1401 Representative spoke with at [place]: [place]  Readmission Risk Interventions Readmission Risk Prevention Plan 11/10/2019 08/10/2019  Transportation Screening Complete Complete  PCP or Specialist Appt within 3-5 Days - Complete  HRI or [place] - Complete  Social Work Consult for [place] Planning/Counseling - Complete  Palliative Care Screening - Complete  Medication Review Press photographer) Complete Complete  HRI or Home Care Consult Complete -  SW Recovery Care/Counseling Consult Complete -  Palliative Care Screening Not Applicable -  Skilled Nursing Facility Complete -  Some recent data might be hidden

## 2019-11-25 NOTE — Plan of Care (Signed)
  Problem: RH BLADDER ELIMINATION Goal: RH STG MANAGE BLADDER WITH ASSISTANCE Description: STG Manage Bladder With min assist   Outcome: Progressing   Problem: RH SAFETY Goal: RH STG ADHERE TO SAFETY PRECAUTIONS W/ASSISTANCE/DEVICE Description: STG Adhere to Safety Precautions With supervision   Outcome: Progressing

## 2019-11-25 NOTE — Progress Notes (Signed)
Physical Therapy Treatment Patient Details Name: Dennis Macias MRN: [id number] DOB: June 10, 1951 Today's Date: 11/25/2019    History of Present Illness Pt is a 69 y.o. male with a PMHx of CAD, ischemic cardiomyopathy, chronic systolic and diastolic heart failure, CKD stage IV, anemia of chronic disease, and BPH with chronic indwelling foley catheter s/p TURP 10/20/19. Came to ED after syncopal episode found to be hypertensive and febrile. Admitted 11/06/19. Plan for cardiac cath however creatinine levels elevated. Cardiology recommends a left heart cath eventually; will hold off for now given acutely altered mental status. Of note, pt with new AMS and a code stroke was called on 5/6. MRI of the brain revealed ischemic infarct in the left parietal region.    PT Comments    Pt was seen for progression of gait to stairs after a trip in the room, specifically to help pt and his sign other decide about the CIR option.  He is able to stand but more tired than earlier, significant help needed to get up on every trial.  Took his partner to the stairs with him, and afterward talked with pt about the decision being his about the trip to rehab, but gave him PT justifications.  He is more open to the idea, and is asking to go.  MD and CIR staff are coming to see him and finalize the decisions.  Follow Up Recommendations  CIR     Equipment Recommendations  None recommended by PT    Recommendations for Other Services Rehab consult     Precautions / Restrictions Precautions Precautions: Fall Precaution Comments: monitor for agitation Restrictions Weight Bearing Restrictions: No    Mobility  Bed Mobility               General bed mobility comments: up in chair when PT arrived  Transfers Overall transfer level: Needs assistance Equipment used: Rolling walker (2 wheeled);1 person hand held assist Transfers: Sit to/from Stand Sit to Stand: Mod assist Stand pivot transfers: Min assist;Mod assist        General transfer comment: 100% reminders for hand placement  Ambulation/Gait Ambulation/Gait assistance: Min assist Gait Distance (Feet): 38 Feet Assistive device: Rolling walker (2 wheeled) Gait Pattern/deviations: Step-through pattern;Wide base of support;Decreased stride length Gait velocity: decreased Gait velocity interpretation: <1.31 ft/sec, indicative of household ambulator General Gait Details: min assist for navigating obstacles and setting up to sit   Stairs Stairs: Yes Stairs assistance: Min assist Stair Management: Two rails;Step to pattern;Forwards;Backwards Number of Stairs: 4 General stair comments: pt is getting up with mod assist and is mildly buckling only once on the second step, then more controlled with cues   Wheelchair Mobility    Modified Rankin (Stroke Patients Only)       Balance Overall balance assessment: Needs assistance Sitting-balance support: Feet supported Sitting balance-Leahy Scale: Good     Standing balance support: Bilateral upper extremity supported;During functional activity Standing balance-Leahy Scale: Poor                              Cognition Arousal/Alertness: Awake/alert Behavior During Therapy: Flat affect Overall Cognitive Status: Impaired/Different from baseline Area of Impairment: Safety/judgement;Problem solving                       Following Commands: Follows one step commands inconsistently;Follows one step commands with increased time Safety/Judgement: Decreased awareness of deficits;Decreased awareness of safety   Problem Solving: Difficulty sequencing;Requires  verbal cues;Requires tactile cues General Comments: reminders of sequence and safety needed      Exercises      General Comments        Pertinent Vitals/Pain Pain Assessment: No/denies pain    Home Living                      Prior Function            PT Goals (current goals can now be found in the  care plan section) Acute Rehab PT Goals Patient Stated Goal: to get home soon Progress towards PT goals: Progressing toward goals    Frequency    Min 4X/week      PT Plan Current plan remains appropriate    Co-evaluation              AM-PAC PT "6 Clicks" Mobility   Outcome Measure  Help needed turning from your back to your side while in a flat bed without using bedrails?: A Little Help needed moving from lying on your back to sitting on the side of a flat bed without using bedrails?: A Little Help needed moving to and from a bed to a chair (including a wheelchair)?: A Little Help needed standing up from a chair using your arms (e.g., wheelchair or bedside chair)?: A Lot Help needed to walk in hospital room?: A Little Help needed climbing 3-5 steps with a railing? : A Lot 6 Click Score: 16    End of Session Equipment Utilized During Treatment: Gait belt Activity Tolerance: Patient tolerated treatment well Patient left: in chair;with call bell/phone within reach;with family/visitor present Nurse Communication: Mobility status PT Visit Diagnosis: Other abnormalities of gait and mobility (R26.89);Muscle weakness (generalized) (M62.81);Difficulty in walking, not elsewhere classified (R26.2);Unsteadiness on feet (R26.81)     Time: 1315-1350 PT Time Calculation (min) (ACUTE ONLY): 35 min  Charges:  $Gait Training: 8-22 mins $Therapeutic Activity: 8-22 mins                   Ramond Dial 11/25/2019, 2:04 PM  Mee Hives, PT MS Acute Rehab Dept. Number: [place] [facility] [place]

## 2019-11-25 NOTE — Progress Notes (Signed)
Charlett Blake, MD  Physician  Physical Medicine and Rehabilitation  Consult Note     Signed  Date of Service:  11/16/2019  8:38 AM      Related encounter: ED to Hosp-Admission (Current) from 11/06/2019 in [facility]            Physical Medicine and Rehabilitation Consult     Reason for Consult: Functional decline due to stroke.  Referring Physician: Dr. Daryll Drown     HPI: Dennis Macias is a 69 y.o. male with history of HTN, T2DM, BPH- s/p TURP/foley, prior CVA, recent admission for anasarca s/p thoracocentesis and IV diuresis; who was admitted on 11/06/19 after syncopal episode. He was found to have elevated troponin's due to NSTEMI as well as  frequent PVC's with NSVT. UDS negative. He developed fevers and chills past admission. He was started on cefepime for bacteremia due to pseudomonas UTI.  He developed progressive lethargy with confusion on 05/05 with BUE tremors felt to be due to toxic metabolic encephalopathy. On 05/06, he was found to have global aphasia and MRI/MRA brain done revealing acute 10 mm left parietal lobe subcortical infarct, chronic left thalamic infarct, advanced chronic small vessel disease and no intracranial stenosis.  EEG was suggestive of moderate diffuse nonspecific encephalopathy. Cefepime changed to [place] due to concerns of cefepime related encephalopathy.    Stroke felt to be incidental findings due to small vessel disease. Cardiology expressed concerns that ventricular arrhthymias in setting of CM could be a cause of syncope but plans for cath placed on hold with recommendations of medical treatment for NSTEMI and cardiac cath if patient stable and continues to improve.  Acute on chronic renal failure improving with IVF for hydration. He has defervesced with improvement in mentation, requires multimodal cues to follow commands, occasional BUE tremors noted with decrease in coordination, has  limited verbal output and continues to have limitations in mobility and ADLs. CIR recommended due to functional decline.    No chest pains has nitroglycerin drip infusing   Review of Systems  Constitutional: Negative for chills and fever.  HENT: Negative for hearing loss and tinnitus.   Eyes:       Blind in left eye.   Respiratory: Negative for cough and shortness of breath.   Cardiovascular: Negative for chest pain and palpitations.  Gastrointestinal: Negative for constipation, heartburn and nausea.  Musculoskeletal: Negative for back pain, myalgias and neck pain.  Neurological: Positive for weakness. Negative for dizziness and headaches.  Psychiatric/Behavioral: Positive for memory loss.          Past Medical History:  Diagnosis Date  . Anemia    . Arthritis      past hx   . Blindness      right eye  . Cataract      removed both eyes  . Dehydration    . Diabetes ([place])    . Glaucoma    . History of CVA (cerebrovascular accident) 09/13/2015  . History of urinary retention    . Hyperlipidemia    . Hypertension    . Pernicious anemia 02/24/2018  . Stroke [facility])      2017- March  . Syncope 11/2019  . Tachycardia 08/26/2017  . Tubular adenoma of colon 02/2017  . Weight loss, non-intentional 08/26/2017    10 lbs between 6/18 & 2/19         Past Surgical History:  Procedure Laterality Date  . CATARACT EXTRACTION, BILATERAL      . COLONOSCOPY      . IR THORACENTESIS ASP PLEURAL SPACE W/IMG GUIDE   09/21/2019  . IR THORACENTESIS ASP PLEURAL SPACE W/IMG GUIDE   10/16/2019  . POLYPECTOMY      . REFRACTIVE SURGERY   10/2017  . TEE WITHOUT CARDIOVERSION N/A 09/14/2015    Procedure: TRANSESOPHAGEAL ECHOCARDIOGRAM (TEE);  Surgeon: Larey Dresser, MD;  Location: [facility];  Service: Cardiovascular;  Laterality: N/A;  . TRANSURETHRAL RESECTION OF PROSTATE N/A 10/20/2019    Procedure: TRANSURETHRAL RESECTION OF THE PROSTATE (TURP);  Surgeon: Irine Seal, MD;  Location: [facility];  Service:  Urology;  Laterality: N/A;  . UPPER GASTROINTESTINAL ENDOSCOPY               Family History  Problem Relation Age of Onset  . Hypertension Mother    . Hyperlipidemia Mother    . Hyperlipidemia Father    . Colon cancer Neg Hx    . Colon polyps Neg Hx    . Esophageal cancer Neg Hx    . Rectal cancer Neg Hx    . Stomach cancer Neg Hx        Social History:  Lives with girlfriend. 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  He reports that he has quit smoking. He quit smokeless tobacco use about 41 years ago.  His smokeless tobacco use included chew. He reports that he does not drink alcohol or use drugs.      Allergies: No Known Allergies            Medications Prior to Admission  Medication Sig Dispense Refill  . aspirin EC 81 MG tablet Take 1 tablet (81 mg total) by mouth daily.      Marland Kitchen atorvastatin (LIPITOR) 80 MG tablet Take 1 tablet (80 mg total) by mouth at bedtime. IM program 90 tablet 3  . dorzolamide-timolol (COSOPT) 22.3-6.8 MG/ML ophthalmic solution Place 1 drop into the right eye 2 (two) times daily.    10  . ferrous gluconate (FERGON) 324 MG tablet Take 324 mg by mouth daily with breakfast.      . finasteride (PROSCAR) 5 MG tablet Take 5 mg by mouth daily.      . furosemide (LASIX) 80 MG tablet Take 1 tablet (80 mg total) by mouth daily. 30 tablet 1  . hydrALAZINE (APRESOLINE) 25 MG tablet Take 3 tablets (75 mg total) by mouth 3 (three) times daily. 270 tablet 0  . isosorbide dinitrate (ISORDIL) 20 MG tablet Take 2 tablets (40 mg total) by mouth 3 (three) times daily. 90 tablet 1  . metoprolol succinate (TOPROL-XL) 50 MG 24 hr tablet Take 1 tablet (50 mg total) by mouth daily. Take with or immediately following a meal. 30 tablet 1  . sodium bicarbonate 650 MG tablet Take 650 mg by mouth 2 (two) times daily.      . tamsulosin (FLOMAX) 0.4 MG CAPS capsule Take 0.4 mg by mouth daily after supper.      . vitamin B-12 (CYANOCOBALAMIN) 1000 MCG tablet Take 1,000 mcg by  mouth daily.      Marland Kitchen ACCU-CHEK FASTCLIX LANCETS MISC Check blood sugar up to 7 times a week as instructed (Patient taking differently: 1 each by Other route See admin instructions. Check blood sugar up to 7 times a week as instructed) 102 each 3  . Blood Glucose Monitoring Suppl (ACCU-CHEK GUIDE) w/Device KIT 1 each by Does  not apply route daily. Check blood sugar as instructed up to 7 times a week (Patient taking differently: 1 each by Does not apply route See admin instructions. Check blood sugar as instructed up to 7 times a week) 1 kit 0  . glucose blood (ACCU-CHEK GUIDE) test strip Check blood sugar up to 7 times a week as instructed (Patient taking differently: 1 each by Other route See admin instructions. Check blood sugar up to 7 times a week as instructed) 100 each 3      Home: Home Living Family/patient expects to be discharged to:: Private residence Living Arrangements: Spouse/significant other Available Help at Discharge: Family Type of Home: Apartment Home Access: Stairs to enter Technical brewer of Steps: 6 Entrance Stairs-Rails: Left Home Layout: One level, Able to live on main level with bedroom/bathroom Bathroom Shower/Tub: Chiropodist: Handicapped height Bathroom Accessibility: Yes Home Equipment: Bedside commode, Wheelchair - manual, Cane - quad, Environmental consultant - 2 wheels Additional Comments: girlfriend works, his children live near by and can come help him PRN  Functional History: Prior Function Level of Independence: Needs assistance Gait / Transfers Assistance Needed: usually ambulates with walker or cane, limited community distances ADL's / Homemaking Assistance Needed: independent with dressing, and sponge baths, girlfriend does cooking and cleaning Functional Status:  Mobility: Bed Mobility Overal bed mobility: Needs Assistance Bed Mobility: Rolling, Sidelying to Sit Rolling: Min assist Sidelying to sit: Mod assist, +2 for physical assistance,  HOB elevated Sit to sidelying: Min guard General bed mobility comments: Multimodal cues and increased time. Assist to bring feet off bed and push trunk up into sitting.  Transfers Overall transfer level: Needs assistance Equipment used: Rolling walker (2 wheeled) Transfers: Sit to/from Stand, [employer] Transfers Sit to Stand: Mod assist, Min assist, +2 physical assistance, +2 safety/equipment Stand pivot transfers: Min assist, +2 physical assistance, +2 safety/equipment General transfer comment: Initially required mod assist x 2 to stand from bed, progressing to min assist for additional sit/stands. Multimodal cues and gestures to complete task. Increased time needed. Assist to manuever RW during transfers.  Ambulation/Gait Ambulation/Gait assistance: Min Web designer (Feet): 20 Feet Assistive device: Quad cane Gait Pattern/deviations: Step-through pattern, Decreased step length - right, Decreased step length - left, Shuffle, Trunk flexed General Gait Details: slow, mildly unsteady gait, reaching out for bed rail and sink with ambulation to door and back, reports he was walking better before he came to hospital Gait velocity: slowed Gait velocity interpretation: 1.31 - 2.62 ft/sec, indicative of limited community ambulator   ADL: ADL Overall ADL's : Needs assistance/impaired Eating/Feeding: Sitting, Minimal assistance Eating/Feeding Details (indicate cue type and reason): While seated in bedside chair. Assist to open containers. Pt utilized right hand, able to hold and load utensils. Pt placed left hand underneath utensil in right as added precaution in case food is dropped. Pt required increased time to complete. Occasional jerking of BUEs.  Grooming: Minimal assistance, Sitting Upper Body Bathing: Moderate assistance, Minimal assistance, Sitting Lower Body Bathing: Maximal assistance, Sitting/lateral leans, Sit to/from stand Upper Body Dressing : Moderate assistance, Sitting  Lower Body Dressing: Maximal assistance, Sit to/from stand, Sitting/lateral leans Toilet Transfer: Minimal assistance, +2 for physical assistance, +2 for safety/equipment, BSC Toilet Transfer Details (indicate cue type and reason): Able to stand pivot with increased time and gestures to complete.  Toileting- Clothing Manipulation and Hygiene: Total assistance, Sit to/from stand Toileting - Clothing Manipulation Details (indicate cue type and reason): To manage clothing and complete peri care following BM.  Functional mobility during ADLs: Minimal assistance, +2 for physical assistance, +2 for safety/equipment, Rolling walker General ADL Comments: Pt tolerated sitting EOB ~10 min with min guard to ensure balance and safety. Pt able to transfer to/from [facility] and to bedside chair with RW and min assist x 2 for balance and safety. Assist to manuever RW.    Cognition: Cognition Overall Cognitive Status: Difficult to assess Orientation Level: Oriented to person, Oriented to place, Oriented to situation, Disoriented to time Cognition Arousal/Alertness: Awake/alert Behavior During Therapy: Flat affect Overall Cognitive Status: Difficult to assess Area of Impairment: Following commands Following Commands: Follows one step commands inconsistently, Follows one step commands with increased time General Comments: Provided max multimodal cues for all tasks. Pt does better following commands with gestures or when task is demonstrated to him. Increased time needed to complete all tasks. Pt continually states "Oh!" throughout session.   Difficult to assess due to: Impaired communication     Blood pressure (!) 177/81, pulse 67, temperature 98.1 F (36.7 C), temperature source Oral, resp. rate 16, height 6' (1.829 m), weight 67 kg, SpO2 99 %. Physical Exam  Nursing note and vitals reviewed. Constitutional: He is oriented to person, place, and time. He appears well-developed and well-nourished.  HENT:  Head:  Normocephalic and atraumatic.  Eyes: Pupils are equal, round, and reactive to light. Conjunctivae are normal.  Cardiovascular: Normal rate, regular rhythm and normal heart sounds. Exam reveals no friction rub.  No murmur heard. Respiratory: Effort normal and breath sounds normal. No stridor. No respiratory distress. He has no wheezes.  GI: Soft. Bowel sounds are normal. He exhibits no distension. There is no abdominal tenderness.  Musculoskeletal:     Comments: Stasis changes BLE.   Neurological: He is alert and oriented to person, place, and time.  Speech clear. He had difficulty following 2 step commands.  Oriented x3 Motor strength is 4/5 bilateral deltoid bicep tricep grip hip flexion knee extension ankle dorsiflexion  Sensation reduced in the toes of both feet  Skin: Skin is warm and dry.  Psychiatric: He has a normal mood and affect.      Lab Results Last 24 Hours       Results for orders placed or performed during the hospital encounter of 11/06/19 (from the past 24 hour(s))  CBC     Status: Abnormal    Collection Time: 11/16/19  7:10 AM  Result Value Ref Range    WBC 6.1 4.0 - 10.5 K/uL    RBC 3.02 (L) 4.22 - 5.81 MIL/uL    Hemoglobin 8.8 (L) 13.0 - 17.0 g/dL    HCT 26.9 (L) 39.0 - 52.0 %    MCV 89.1 80.0 - 100.0 fL    MCH 29.1 26.0 - 34.0 pg    MCHC 32.7 30.0 - 36.0 g/dL    RDW 13.3 11.5 - 15.5 %    Platelets 259 150 - 400 K/uL    nRBC 0.0 0.0 - 0.2 %      Imaging Results (Last 48 hours)  No results found.       Assessment/Plan: Diagnosis: Encephalopathy toxic/metabolic and debility related to coronary artery disease, 1. Does the need for close, 24 hr/day medical supervision in concert with the patient's rehab needs make it unreasonable for this patient to be served in a less intensive setting? Yes 2. Co-Morbidities requiring supervision/potential complications: Congestive heart failure, CKD 4, history of anasarca 3. Due to bladder management, bowel management,  safety, skin/wound care, disease management, medication administration,  pain management and patient education, does the patient require 24 hr/day rehab nursing? Yes 4. Does the patient require coordinated care of a physician, rehab nurse, therapy disciplines of PT, OT, speech to address physical and functional deficits in the context of the above medical diagnosis(es)? Yes Addressing deficits in the following areas: balance, endurance, locomotion, strength, transferring, bowel/bladder control, bathing, dressing, feeding, grooming, toileting, cognition and psychosocial support 5. Can the patient actively participate in an intensive therapy program of at least 3 hrs of therapy per day at least 5 days per week? Potentially 6. The potential for patient to make measurable gains while on inpatient rehab is fair 7. Anticipated functional outcomes upon discharge from inpatient rehab are supervision  with PT, supervision with OT, supervision with SLP. 8. Estimated rehab length of stay to reach the above functional goals is: 8 to 12 days 9. Anticipated discharge destination: Home 10. Overall Rehab/Functional Prognosis: good   RECOMMENDATIONS: This patient's condition is appropriate for continued rehabilitative care in the following setting: CIR once tolerating therapy off nitroglycerin. Patient has agreed to participate in recommended program. Cognitively unable to make this decision, significant other would like him to come to rehab Note that insurance prior authorization may be required for reimbursement for recommended care.   Comment: Still on nitroglycerin drip, also recommended to have cardiac catheterization.  This was postponed due to punctate parietal infarct.  Neurology felt it was safe to undergo catheterization from a stroke standpoint.  Optimal timing of rehab would be after cardiac catheterization in order to assess coronary arteries prior to increasing exercise demand.      Bary Leriche, PA-C  11/16/2019    "I have personally performed a face to face diagnostic evaluation of this patient.  Additionally, I have reviewed and concur with the physician assistant's documentation above." Charlett Blake M.D.  Medical Group [id number]&[id number] (Neuromuscular Med) Diplomate Am Board of Electrodiagnostic Med Fellow Am Board of Interventional Pain          Revision History                          Routing History

## 2019-11-25 NOTE — Progress Notes (Signed)
Raechel Ache, OT  Rehab Admission Coordinator  Physical Medicine and Rehabilitation  PMR Pre-admission     Signed  Date of Service:  11/17/2019  4:48 PM      Related encounter: ED to Hosp-Admission (Current) from 11/06/2019 in [facility]      Signed        PMR Admission Coordinator Pre-Admission Assessment   Patient: Dennis Macias is an 69 y.o., male MRN: [id number] DOB: Nov 18, 1950 Height: 6' (182.9 cm) Weight: 77.5 kg                                                                                                                                                  Insurance Information HMO: yes    PPO:      PCP:      IPA:      80/20:      OTHER:  PRIMARY: [employer]      Policy#: [id number]      Subscriber: patient CM Name: Dennis Macias review; received verbal approval from Dennis Macias      Phone#: [phone]     Fax#: [phone] Pre-Cert#: [id number]      Employer:  Dennis Macias provided by Dennis Macias for admit to CIR. Planned admit date 5/19 with next review date 5/26. F/u CM is Dennis Macias (p): [phone] (f): [phone] Benefits:  Phone #: online at [email or web address], transaction ID #[id number]     Name:  Eff. Date: 07/10/19-07/08/20     Deduct: $500 ($112.78 met)      Out of Pocket Max: $6,700 ($6,700 met)      Life Max: NA  CIR: $450/day co-pay for days 1-4, $0/day co-pay for days 5-90.       SNF: 100% coverage, 0% co-insurance; limited by medical necessity  Outpatient: $20-$40/visit co-pay pending service; visits limited by medical necessity Home Health: 100% coverage, 0% co-insurance; limited by medical necessity       DME: 80% coverage, 20% co-insurance Providers:  SECONDARY: None      Policy#:       Phone#:    Development worker, community:       Phone#:    The Engineer, petroleum" for patients in Inpatient Rehabilitation Facilities with attached "Privacy Act [place] Records" was provided and verbally  reviewed with: Patient and Family   Emergency Contact Information         Contact Information     Name Relation Home Work Mobile    Dennis Macias Spouse [phone]   [phone]    Dennis, Macias Daughter [phone]   [phone]       Current Medical History  Patient Admitting Diagnosis: Encephalopathy toxic/metabolic and debility related to coronary artery disease   History of Present Illness: Dennis Macias is a 69 y.o. male with history of HTN, T2DM, BPH- Macias/p TURP/foley, prior CVA,  recent admission for anasarca Macias/p thoracocentesis and IV diuresis; who was admitted on 11/06/19 after syncopal episode. He was found to have elevated troponin'Macias due to NSTEMI as well as  frequent PVC'Macias with NSVT. UDS negative. He developed fevers and chills past admission. He was started on cefepime for bacteremia due to pseudomonas UTI.  He developed progressive lethargy with confusion on 05/05 with BUE tremors felt to be due to toxic metabolic encephalopathy. On 05/06, he was found to have global aphasia and MRI/MRA brain done revealing acute 10 mm left parietal lobe subcortical infarct, chronic left thalamic infarct, advanced chronic small vessel disease and no intracranial stenosis.  EEG was suggestive of moderate diffuse nonspecific encephalopathy. Cefepime changed to [place] due to concerns of cefepime related encephalopathy.    Stroke felt to be incidental findings due to small vessel disease. Cardiology expressed concerns that ventricular arrhthymias in setting of CM could be a cause of syncope. Cardiac cath showed mid LAD lesion as well as ostial LAD lesion; given complicated nature of lesion, he Macias not undergo PCI at that time.  Acute on chronic renal failure improving with IVF for hydration. At time of PM&R consult he required multimodal cues to follow commands, occasional BUE tremors noted with decrease in coordination, had limited verbal output and limitations in mobility and ADLs. CIR recommended due to  functional decline. Since consult, pt does continue to show Improvement in mentation and all goals met regarding dysphagia treatment. Pt is to admit to CIR on 11/25/19.    No chest pains has nitroglycerin drip infusing Complete NIHSS TOTAL: 0 Glasgow Coma Scale Score: 15   Past Medical History      Past Medical History:  Diagnosis Date  . Anemia    . Arthritis      past hx   . Blindness      right eye  . Cataract      removed both eyes  . Dehydration    . Diabetes ([place])    . Glaucoma    . History of CVA (cerebrovascular accident) 09/13/2015  . History of urinary retention    . Hyperlipidemia    . Hypertension    . Pernicious anemia 02/24/2018  . Stroke [facility])      2017- March  . Syncope 11/2019  . Tachycardia 08/26/2017  . Tubular adenoma of colon 02/2017  . Weight loss, non-intentional 08/26/2017    10 lbs between 6/18 & 2/19      Family History  family history includes Hyperlipidemia in his father and mother; Hypertension in his mother.   Prior Rehab/Hospitalizations:  Has the patient had prior rehab or hospitalizations prior to admission? Yes   Has the patient had major surgery during 100 days prior to admission? Yes   Current Medications    Current Facility-Administered Medications:  .  0.9 %  sodium chloride infusion, 250 mL, Intravenous, PRN, Dennis Booze, MD .  acetaminophen (TYLENOL) tablet 650 mg, 650 mg, Oral, Q6H PRN, 650 mg at 11/25/19 0750 **OR** acetaminophen (TYLENOL) suppository 650 mg, 650 mg, Rectal, Q6H PRN, Dennis Grooms S, MD .  amLODipine (NORVASC) tablet 5 mg, 5 mg, Oral, Daily, Dennis Grooms S, MD, 5 mg at 11/25/19 1054 .  aspirin chewable tablet 81 mg, 81 mg, Oral, Daily, Dennis Booze, MD, 81 mg at 11/25/19 1053 .  atorvastatin (LIPITOR) tablet 80 mg, 80 mg, Oral, QHS, Dennis Booze, MD, 80 mg at 11/24/19 2158 .  carvedilol (COREG) tablet 25 mg, 25 mg, Oral,  BID WC, Dennis Booze, MD, 25 mg at 11/25/19 0749 .   clopidogrel (PLAVIX) tablet 75 mg, 75 mg, Oral, Daily, Dennis Booze, MD, 75 mg at 11/25/19 1053 .  Darbepoetin Alfa (ARANESP) injection 100 mcg, 100 mcg, Subcutaneous, Q Tue-1800, Dennis Melena, MD, 100 mcg at 11/24/19 1639 .  dorzolamide-timolol (COSOPT) 22.3-6.8 MG/ML ophthalmic solution 1 drop, 1 drop, Right Eye, BID, Dennis Booze, MD, 1 drop at 11/25/19 1249 .  feeding supplement (ENSURE ENLIVE) (ENSURE ENLIVE) liquid 237 mL, 237 mL, Oral, TID BM, Gilles Chiquito B, MD, 237 mL at 11/25/19 1249 .  ferrous gluconate (FERGON) tablet 324 mg, 324 mg, Oral, Q breakfast, Dennis Booze, MD, 324 mg at 11/25/19 0749 .  finasteride (PROSCAR) tablet 5 mg, 5 mg, Oral, Daily, Dennis Grooms S, MD, 5 mg at 11/25/19 1053 .  heparin injection 5,000 Units, 5,000 Units, Subcutaneous, Q8H, Dennis Booze, MD, 5,000 Units at 11/25/19 2637 .  hydrALAZINE (APRESOLINE) tablet 75 mg, 75 mg, Oral, TID, [name], MD, 75 mg at 11/25/19 1054 .  isosorbide mononitrate (IMDUR) 24 hr tablet 60 mg, 60 mg, Oral, Daily, Dennis Booze, MD, 60 mg at 11/25/19 1054 .  MEDLINE mouth rinse, 15 mL, Mouth Rinse, BID, Dennis Booze, MD, 15 mL at 11/25/19 1249 .  ondansetron (ZOFRAN) injection 4 mg, 4 mg, Intravenous, Q6H PRN, Dennis Grooms S, MD .  sodium chloride flush (NS) 0.9 % injection 3 mL, 3 mL, Intravenous, Q12H, Dennis Booze, MD, 3 mL at 11/25/19 1250 .  sodium chloride flush (NS) 0.9 % injection 3 mL, 3 mL, Intravenous, PRN, Dennis Grooms S, MD .  tamsulosin [facility]) capsule 0.4 mg, 0.4 mg, Oral, QPC supper, Dennis Grooms S, MD, 0.4 mg at 11/24/19 1638 .  vitamin B-12 (CYANOCOBALAMIN) tablet 1,000 mcg, 1,000 mcg, Oral, Daily, Dennis Booze, MD, 1,000 mcg at 11/25/19 1053   Patients Current Diet:     Diet Order                      Diet regular Room service appropriate? Yes with Assist; Fluid consistency: Thin  Diet effective now                    Precautions / Restrictions Precautions Precautions: Fall Precaution Comments: monitor for agitation Restrictions Weight Bearing Restrictions: No    Has the patient had 2 or more falls or a fall with injury in the past year?No   Prior Activity Level Limited Community (1-2x/wk): retired; Macias not drive; would go out for appointments mostly   Prior Functional Level Prior Function Level of Independence: Needs assistance Gait / Transfers Assistance Needed: usually ambulates with walker or cane, limited community distances ADL'Macias / Homemaking Assistance Needed: independent with dressing, and sponge baths, girlfriend does cooking and cleaning   Self Care: Macias the patient need help bathing, dressing, using the toilet or eating?  Independent   Indoor Mobility: Macias the patient need assistance with walking from room to room (with or without device)? Independent   Stairs: Macias the patient need assistance with internal or external stairs (with or without device)? Independent   Functional Cognition: Macias the patient need help planning regular tasks such as shopping or remembering to take medications? Needed some help   Home Assistive Devices / Equipment Home Assistive Devices/Equipment: Chana Bode (specify type) Home Equipment: Bedside commode, Wheelchair - manual, Cane - quad, Environmental consultant - 2 wheels   Prior Device  Use: Indicate devices/aids used by the patient prior to current illness, exacerbation or injury? no AD in the home; cane for community mobility    Current Functional Level Cognition   Overall Cognitive Status: Within Functional Limits for tasks assessed Difficult to assess due to: Impaired communication Orientation Level: (P) Oriented X4 Following Commands: (P) Follows one step commands inconsistently(requires v/c'Macias for sequencing and attention) Safety/Judgement: Decreased awareness of deficits, Decreased awareness of safety General Comments: reminders of sequence and  safety needed    Extremity Assessment (includes Sensation/Coordination)   Upper Extremity Assessment: Generalized weakness, RUE deficits/detail, LUE deficits/detail, Difficult to assess due to impaired cognition RUE Deficits / Details: RUE AROM shld flex to 90 degrees. Pt unable to follow additional instructions PROM elbow, wrist, and digits WFL. Pt able to minimally squeeze therapists hands once demonstrated.  RUE Coordination: decreased gross motor(Occasional jerking movements of BUE.) LUE Deficits / Details: LUE AROM shld flex to 80 degrees. Pt unable to follow additional instructions PROM elbow, wrist, and digits WFL. Pt able to minimally squeeze therapists hands once demonstrated.  LUE Coordination: decreased gross motor(Occasional jerking movements of BUE.)  Lower Extremity Assessment: Defer to PT evaluation     ADLs   Overall ADL'Macias : Needs assistance/impaired Eating/Feeding: Sitting, Minimal assistance Eating/Feeding Details (indicate cue type and reason): While seated in bedside chair. Assist to open containers. Pt utilized right hand, able to hold and load utensils. Pt placed left hand underneath utensil in right as added precaution in case food is dropped. Pt required increased time to complete. Occasional jerking of BUEs.  Grooming: Minimal assistance, Sitting Upper Body Bathing: Moderate assistance, Minimal assistance, Sitting Lower Body Bathing: Maximal assistance, Sitting/lateral leans, Sit to/from stand Upper Body Dressing : Moderate assistance, Sitting Lower Body Dressing: Supervision/safety, Set up Lower Body Dressing Details (indicate cue type and reason): Min A sit<stand from recliner (lower surface) Toilet Transfer: Minimal assistance, Ambulation, RW Toilet Transfer Details (indicate cue type and reason): recliner around to bed Toileting- Clothing Manipulation and Hygiene: Total assistance, Sit to/from stand Toileting - Clothing Manipulation Details (indicate cue type and  reason): To manage clothing and complete peri care following BM.  Functional mobility during ADLs: Minimal assistance, +2 for physical assistance, +2 for safety/equipment, Rolling walker General ADL Comments: Pt tolerated sitting EOB ~10 min with min guard to ensure balance and safety. Pt able to transfer to/from [facility] and to bedside chair with RW and min assist x 2 for balance and safety. Assist to manuever RW.      Mobility   Overal bed mobility: Needs Assistance Bed Mobility: Supine to Sit Rolling: Min guard Sidelying to sit: Min assist, HOB elevated Supine to sit: HOB elevated, Min guard(increased time and effort with use of bedrail ) Sit to supine: Min guard Sit to sidelying: Min guard General bed mobility comments: up in chair when PT arrived     Transfers   Overall transfer level: Needs assistance Equipment used: Rolling walker (2 wheeled), 1 person hand held assist Transfers: Sit to/from Stand Sit to Stand: Mod assist Stand pivot transfers: Min assist, Mod assist General transfer comment: 100% reminders for hand placement     Ambulation / Gait / Stairs / Wheelchair Mobility   Ambulation/Gait Ambulation/Gait assistance: Herbalist (Feet): 38 Feet Assistive device: Rolling walker (2 wheeled) Gait Pattern/deviations: Step-through pattern, Wide base of support, Decreased stride length General Gait Details: min assist for navigating obstacles and setting up to sit Gait velocity: decreased Gait velocity interpretation: <1.31 ft/sec, indicative  of household ambulator Stairs: Yes Stairs assistance: Min assist Stair Management: Two rails, Step to pattern, Forwards, Backwards Number of Stairs: 4 General stair comments: pt is getting up with mod assist and is mildly buckling only once on the second step, then more controlled with cues     Posture / Balance Dynamic Sitting Balance Sitting balance - Comments: able to dynamically reach feet with  supervision Balance Overall balance assessment: Needs assistance Sitting-balance support: Feet supported Sitting balance-Leahy Scale: Good Sitting balance - Comments: able to dynamically reach feet with supervision Standing balance support: Bilateral upper extremity supported, During functional activity Standing balance-Leahy Scale: Poor Standing balance comment: reliant on RW     Special needs/care consideration Diabetic management: yes, Behavioral consideration: come noncompliance with therapy and Designated visitor: [name] and [place] (from acute therapy documentation) Living Arrangements: Spouse/significant other Available Help at Discharge: Family Type of Home: Apartment Home Layout: One level, Able to live on main level with bedroom/bathroom Home Access: Stairs to enter Entrance Stairs-Rails: Left Entrance Stairs-Number of Steps: 6 Bathroom Shower/Tub: Chiropodist: Handicapped height Bathroom Accessibility: Yes Home Care Services: No Additional Comments: girlfriend works, his children live near by and can come help him PRN   Discharge Living Setting Plans for Discharge Living Setting: Lives with (comment), Apartment(wife) Type of Home at Discharge: Apartment(basement apartment (steps lead down to basement)) Discharge Home Layout: One level Discharge Home Access: Stairs to enter Entrance Stairs-Rails: Left Entrance Stairs-Number of Steps: 7 Discharge Bathroom Shower/Tub: Tub/shower unit(mostly been completing sink baths) Discharge Bathroom Toilet: Handicapped height Discharge Bathroom Accessibility: Yes How Accessible: Accessible via walker Does the patient have any problems obtaining your medications?: No   Social/Family/Support Systems Patient Roles: Spouse Contact Information: [name] (wife): [phone] Anticipated Caregiver: wife + family members Anticipated Caregiver'Macias Contact Information: see  above Ability/Limitations of Caregiver: supervision Caregiver Availability: 24/7 Discharge Plan Discussed with Primary Caregiver: Yes(with pt and wife) Is Caregiver In Agreement with Plan?: Yes Does Caregiver/Family have Issues with Lodging/Transportation while Pt is in Rehab?: No     Goals Patient/Family Goal for Rehab: PT/OT/SLP: Mod I/Supervision Expected length of stay: 7-10 days Pt/Family Agrees to Admission and willing to participate: Yes Program Orientation Provided & Reviewed with Pt/Caregiver Including Roles  & Responsibilities: Yes(pt and wife)  Barriers to Discharge: Home environment access/layout  Barriers to Discharge Comments: steps to enter home     Decrease burden of Care through IP rehab admission: NA     Possible need for SNF placement upon discharge:Not anticipated. Pt has good social support at DC from his wife and family members. Anticipate he can reach a supervision level through CIR program to return home safely with family support.      Patient Condition: This patient'Macias medical and functional status has changed since the consult dated: 11/16/19 in which the Rehabilitation Physician determined and documented that the patient'Macias condition is appropriate for intensive rehabilitative care in an inpatient rehabilitation facility. See "History of Present Illness" (above) for medical update. Functional changes are: improvement in functional transfers from Min/Mod A +2 to Min A, improvement in gait 20 feet with Min A to 25 feet Min A +2, and improvement in overall ADLs from Mod A/Total A to Supervision/Min A. Patient'Macias medical and functional status update has been discussed with the Rehabilitation physician and patient remains appropriate for inpatient rehabilitation. Will admit to inpatient rehab today.   Preadmission Screen Completed By:  Raechel Ache, OT,  11/25/2019 2:51 PM ______________________________________________________________________   Discussed status with Dr.  Posey Pronto on 11/25/19 at 2:48PM and received approval for admission today.   Admission Coordinator:  Raechel Ache, time 2:48PM/Date 11/25/19               Cosigned by: Jamse Arn, MD at 11/25/2019  2:58 PM  Revision History

## 2019-11-25 NOTE — H&P (Signed)
Physical Medicine and Rehabilitation Admission H&P    Chief Complaint  Patient presents with  . Debility due to encephalopathy and     HPI:  Dennis Macias is a 69 year old RH male with history of HTN, T2DM, BPH-s/p TURP with Foley placement, prior CVA, CKD- baseline SCR 3.3-3.7,  recent admission for anasarca s/p thoracocentesis with IV diuresis; who was admitted on 11/06/2019 due to syncope. History taken from chart review and patient due to cognition. He was found to have elevated troponin due to NSTEMI as well as frequent PVCs with NSVT.  UDS negative.  He developed fevers with chills past admission and was started on IV cefepime for bacteremia due to Pseudomonas UTI.  He had progressive lethargy with confusion and on 11/11/19 he was noted to have BUE tremors due to metabolic encephalopathy.  He was found to have global aphasia on 05/06 and MRI/MRA brain done revealing 10 mm left parietal lobe subcortical infarct, chronic left thalamic infarct and advanced small vessel disease.  EEG was suggestive of moderate diffuse nonspecific encephalopathy.  Neurology following for input and felt that a MS and speech issues not due to stroke and that encephalopathy was related to cefepime and antibiotics changed to [place].   Stroke was felt to be an incidental finding due to small vessel disease but due to recurrent stroke and history of cardiomyopathy--Dr. Leonie Man recommended 30-day cardiac event monitor to evaluate for PAF.  Cardiology expressed concerns that ventricular tachycardia in setting of cardiomyopathy could be cause of syncope but cath was placed on hold with recommendations of medical treatment for NSTEMI. Nephrology was consulted for input on acute on chronic renal failure which is resolving with IV fluids for hydration.  He underwent cardiac cath on 5/13 revealing 75% stenosis in mid LAD and 80% stenosis ostial LAD to proximal LAD.  SCr peaked to 4.10 post cath and as patient without chest pain,   medically therapy recommended with recommendations to consider PCI if he progresses to HD.  Anemia of chronic disease has been monitored and Aranesp increased drop in H&H to 7.9/25.1.  Mentation is improving and agitation has resolved.  Verbal output has improved and is tolerating regular textures. Therapy ongoing and patient is showing improvement in participation. CIR recommended due to functional decline.  Please see preadmission assessment from earlier today as well.   Review of Systems  Constitutional: Negative for chills and fever.  HENT: Positive for hearing loss. Negative for tinnitus.   Eyes:       Poor vision in right eye.   Respiratory: Negative for cough and shortness of breath.   Cardiovascular: Negative for chest pain and palpitations.  Gastrointestinal: Negative for abdominal pain, heartburn and nausea.  Genitourinary: Negative for dysuria and urgency.  Musculoskeletal: Negative for back pain and myalgias.  Neurological: Positive for weakness. Negative for dizziness and headaches.  Endo/Heme/Allergies: Negative.   Psychiatric/Behavioral: Negative for memory loss. The patient does not have insomnia.    Past Medical History:  Diagnosis Date  . Anemia   . Arthritis    past hx   . Blindness    right eye  . Cataract    removed both eyes  . Dehydration   . Diabetes ([place])   . Glaucoma   . History of CVA (cerebrovascular accident) 09/13/2015  . History of urinary retention   . Hyperlipidemia   . Hypertension   . Pernicious anemia 02/24/2018  . Stroke [facility])    2017- March  . Syncope 11/2019  .  Tachycardia 08/26/2017  . Tubular adenoma of colon 02/2017  . Weight loss, non-intentional 08/26/2017   10 lbs between 6/18 & 2/19    Past Surgical History:  Procedure Laterality Date  . CATARACT EXTRACTION, BILATERAL    . COLONOSCOPY    . IR THORACENTESIS ASP PLEURAL SPACE W/IMG GUIDE  09/21/2019  . IR THORACENTESIS ASP PLEURAL SPACE W/IMG GUIDE  10/16/2019  . LEFT HEART CATH AND  CORONARY ANGIOGRAPHY N/A 11/19/2019   Procedure: LEFT HEART CATH AND CORONARY ANGIOGRAPHY;  Surgeon: Jettie Booze, MD;  Location: [place] [facility];  Service: Cardiovascular;  Laterality: N/A;  . POLYPECTOMY    . REFRACTIVE SURGERY  10/2017  . TEE WITHOUT CARDIOVERSION N/A 09/14/2015   Procedure: TRANSESOPHAGEAL ECHOCARDIOGRAM (TEE);  Surgeon: Larey Dresser, MD;  Location: [facility];  Service: Cardiovascular;  Laterality: N/A;  . TRANSURETHRAL RESECTION OF PROSTATE N/A 10/20/2019   Procedure: TRANSURETHRAL RESECTION OF THE PROSTATE (TURP);  Surgeon: Irine Seal, MD;  Location: [facility];  Service: Urology;  Laterality: N/A;  . UPPER GASTROINTESTINAL ENDOSCOPY      Family History  Problem Relation Age of Onset  . Hypertension Mother   . Hyperlipidemia Mother   . Hyperlipidemia Father   . Colon cancer Neg Hx   . Colon polyps Neg Hx   . Esophageal cancer Neg Hx   . Rectal cancer Neg Hx   . Stomach cancer Neg Hx     Social History:  Lives with girlfriend. 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 He reports that he has quit smoking. He quit smokeless tobacco use about 41 years ago.  His smokeless tobacco use included chew. He reports that he does not drink alcohol or use drugs.    Allergies: No Known Allergies    Medications Prior to Admission  Medication Sig Dispense Refill  . aspirin EC 81 MG tablet Take 1 tablet (81 mg total) by mouth daily.    Marland Kitchen atorvastatin (LIPITOR) 80 MG tablet Take 1 tablet (80 mg total) by mouth at bedtime. IM program 90 tablet 3  . dorzolamide-timolol (COSOPT) 22.3-6.8 MG/ML ophthalmic solution Place 1 drop into the right eye 2 (two) times daily.   10  . ferrous gluconate (FERGON) 324 MG tablet Take 324 mg by mouth daily with breakfast.    . finasteride (PROSCAR) 5 MG tablet Take 5 mg by mouth daily.    . furosemide (LASIX) 80 MG tablet Take 1 tablet (80 mg total) by mouth daily. 30 tablet 1  . hydrALAZINE (APRESOLINE) 25 MG  tablet Take 3 tablets (75 mg total) by mouth 3 (three) times daily. 270 tablet 0  . isosorbide dinitrate (ISORDIL) 20 MG tablet Take 2 tablets (40 mg total) by mouth 3 (three) times daily. 90 tablet 1  . metoprolol succinate (TOPROL-XL) 50 MG 24 hr tablet Take 1 tablet (50 mg total) by mouth daily. Take with or immediately following a meal. 30 tablet 1  . sodium bicarbonate 650 MG tablet Take 650 mg by mouth 2 (two) times daily.    . tamsulosin (FLOMAX) 0.4 MG CAPS capsule Take 0.4 mg by mouth daily after supper.    . vitamin B-12 (CYANOCOBALAMIN) 1000 MCG tablet Take 1,000 mcg by mouth daily.    Marland Kitchen ACCU-CHEK FASTCLIX LANCETS MISC Check blood sugar up to 7 times a week as instructed (Patient taking differently: 1 each by Other route See admin instructions. Check blood sugar up to 7 times a week as instructed)  102 each 3  . Blood Glucose Monitoring Suppl (ACCU-CHEK GUIDE) w/Device KIT 1 each by Does not apply route daily. Check blood sugar as instructed up to 7 times a week (Patient taking differently: 1 each by Does not apply route See admin instructions. Check blood sugar as instructed up to 7 times a week) 1 kit 0  . glucose blood (ACCU-CHEK GUIDE) test strip Check blood sugar up to 7 times a week as instructed (Patient taking differently: 1 each by Other route See admin instructions. Check blood sugar up to 7 times a week as instructed) 100 each 3    Drug Regimen Review  Drug regimen was reviewed and remains appropriate with no significant issues identified  Home: Home Living Family/patient expects to be discharged to:: Private residence Living Arrangements: Spouse/significant other Available Help at Discharge: Family Type of Home: Apartment Home Access: Stairs to enter Technical brewer of Steps: 6 Entrance Stairs-Rails: Left Home Layout: One level, Able to live on main level with bedroom/bathroom Bathroom Shower/Tub: Chiropodist: Handicapped height Bathroom  Accessibility: Yes Home Equipment: Bedside commode, Wheelchair - manual, Cane - quad, Environmental consultant - 2 wheels Additional Comments: girlfriend works, his children live near by and can come help him PRN   Functional History: Prior Function Level of Independence: Needs assistance Gait / Transfers Assistance Needed: usually ambulates with walker or cane, limited community distances ADL's / Homemaking Assistance Needed: independent with dressing, and sponge baths, girlfriend does cooking and cleaning  Functional Status:  Mobility: Bed Mobility Overal bed mobility: Needs Assistance Bed Mobility: Supine to Sit Rolling: Min guard Sidelying to sit: Min assist, HOB elevated Supine to sit: HOB elevated, Min guard(increased time and effort with use of bedrail ) Sit to supine: Min guard Sit to sidelying: Min guard General bed mobility comments: increased time and effort with heavy bedrail use Transfers Overall transfer level: Needs assistance Equipment used: Rolling walker (2 wheeled) Transfers: Sit to/from Stand, [employer] Transfers Sit to Stand: Min assist, +2 physical assistance Stand pivot transfers: Min assist General transfer comment: min Ax2 for power up and steadying at EoB, vc for scooting hips to EoB, min A for power up from recliner in front of sink for wash up  Ambulation/Gait Ambulation/Gait assistance: Min assist Gait Distance (Feet): 25 Feet Assistive device: Rolling walker (2 wheeled) Gait Pattern/deviations: Step-through pattern, Trunk flexed, Decreased stride length General Gait Details: min A for steadying and management of RW, vc for proximity and for navigating RW around obstacles  Gait velocity: decreased Gait velocity interpretation: <1.8 ft/sec, indicate of risk for recurrent falls    ADL: ADL Overall ADL's : Needs assistance/impaired Eating/Feeding: Sitting, Minimal assistance Eating/Feeding Details (indicate cue type and reason): While seated in bedside chair.  Assist to open containers. Pt utilized right hand, able to hold and load utensils. Pt placed left hand underneath utensil in right as added precaution in case food is dropped. Pt required increased time to complete. Occasional jerking of BUEs.  Grooming: Minimal assistance, Sitting Upper Body Bathing: Moderate assistance, Minimal assistance, Sitting Lower Body Bathing: Maximal assistance, Sitting/lateral leans, Sit to/from stand Upper Body Dressing : Moderate assistance, Sitting Lower Body Dressing: Supervision/safety, Set up Lower Body Dressing Details (indicate cue type and reason): Min A sit<stand from recliner (lower surface) Toilet Transfer: Minimal assistance, Ambulation, RW Toilet Transfer Details (indicate cue type and reason): recliner around to bed Toileting- Clothing Manipulation and Hygiene: Total assistance, Sit to/from stand Toileting - Clothing Manipulation Details (indicate cue type and reason):  To manage clothing and complete peri care following BM.  Functional mobility during ADLs: Minimal assistance, +2 for physical assistance, +2 for safety/equipment, Rolling walker General ADL Comments: Pt tolerated sitting EOB ~10 min with min guard to ensure balance and safety. Pt able to transfer to/from [facility] and to bedside chair with RW and min assist x 2 for balance and safety. Assist to manuever RW.   Cognition: Cognition Overall Cognitive Status: Impaired/Different from baseline Orientation Level: Oriented X4 Cognition Arousal/Alertness: Awake/alert Behavior During Therapy: Flat affect Overall Cognitive Status: Impaired/Different from baseline Area of Impairment: Safety/judgement, Problem solving Following Commands: Follows one step commands with increased time Safety/Judgement: Decreased awareness of safety Problem Solving: Difficulty sequencing, Requires verbal cues General Comments: continues to require increased cuing for safety with RW not sure whether it is due to difficulty  processing or HOH Difficult to assess due to: Impaired communication   Blood pressure 134/63, pulse 67, temperature 97.7 F (36.5 C), temperature source Oral, resp. rate 20, height 6' (1.829 m), weight 77.5 kg, SpO2 98 %. Physical Exam  Nursing note and vitals reviewed. Constitutional: He is oriented to person, place, and time. He appears well-developed and well-nourished.  HENT:  Head: Normocephalic and atraumatic.  Eyes: EOM are normal. Right eye exhibits no discharge. Left eye exhibits no discharge. Right pupil is not round and not reactive.  Neck: No tracheal deviation present. No thyromegaly present.  Respiratory: Effort normal. No stridor. No respiratory distress.  GI: Soft. He exhibits no distension.  Musculoskeletal:     Comments: 1+ pitting edema right forearm.   Neurological: He is alert and oriented to person, place, and time.  Slow to respond --decreased hearing noted.  He was a ble to answer basic orientation questions he is able to follow two-step commands.   He lacks insight/awareness of deficits.  Motor: 4+/5 throughout  Skin: Skin is warm and dry.  Stasis changes bilateral shins.   Psychiatric: He has a normal mood and affect. His speech is normal.    Results for orders placed or performed during the hospital encounter of 11/06/19 (from the past 48 hour(s))  Renal function panel     Status: Abnormal   Collection Time: 11/24/19  3:58 AM  Result Value Ref Range   Sodium 139 135 - 145 mmol/L   Potassium 4.7 3.5 - 5.1 mmol/L   Chloride 106 98 - 111 mmol/L   CO2 25 22 - 32 mmol/L   Glucose, Bld 280 (H) 70 - 99 mg/dL    Comment: Glucose reference range applies only to samples taken after fasting for at least 8 hours.   BUN 54 (H) 8 - 23 mg/dL   Creatinine, Ser 4.07 (H) 0.61 - 1.24 mg/dL   Calcium 8.6 (L) 8.9 - 10.3 mg/dL   Phosphorus 2.9 2.5 - 4.6 mg/dL   Albumin 1.9 (L) 3.5 - 5.0 g/dL   GFR calc non Af Amer 14 (L) >60 mL/min   GFR calc Af Amer 16 (L) >60 mL/min     Anion gap 8 5 - 15    Comment: Performed at [facility] [address], [place], [place] [address]  Renal function panel     Status: Abnormal   Collection Time: 11/25/19  4:04 AM  Result Value Ref Range   Sodium 136 135 - 145 mmol/L   Potassium 5.0 3.5 - 5.1 mmol/L   Chloride 104 98 - 111 mmol/L   CO2 24 22 - 32 mmol/L   Glucose, Bld 218 (  H) 70 - 99 mg/dL    Comment: Glucose reference range applies only to samples taken after fasting for at least 8 hours.   BUN 66 (H) 8 - 23 mg/dL   Creatinine, Ser 4.09 (H) 0.61 - 1.24 mg/dL   Calcium 8.6 (L) 8.9 - 10.3 mg/dL   Phosphorus 2.3 (L) 2.5 - 4.6 mg/dL   Albumin 1.9 (L) 3.5 - 5.0 g/dL   GFR calc non Af Amer 14 (L) >60 mL/min   GFR calc Af Amer 16 (L) >60 mL/min   Anion gap 8 5 - 15    Comment: Performed at [facility] [address] [place], [place] [address]   No results found.     Medical Problem List and Plan: 1.  Deficits with mobility, transfers, self-care secondary to debility with metabolic encephalopathy.  -patient may shower  -ELOS/Goals: 7-10 days/Supervision/Mod I  Admit to CIR 2.  Antithrombotics: -DVT/anticoagulation:  Pharmaceutical: Heparin  -antiplatelet therapy: DAPT 3. Pain Management: Tylenol prn.  4. Mood: LCSW to follow for evaluation and support.   -antipsychotic agents: N/A 5. Neuropsych: This patient is not fully capable of making decisions on his own behalf. 6. Skin/Wound Care: Routine pressure relief measures.  7. Fluids/Electrolytes/Nutrition: Monitor I/Os. Monitor for signs of overload- check daily weights.  Monitor renal status for recovery.  8. Cefipime induced encephalopathy: Improving 9. Acute on chronic renal failure: Followed by Dr. Hollie Salk. SCr-40.9 today.    CMP ordered for tomorrow.  10 HTN: Monitor BP tid--on Norvasc, Imdur, Hydralazine and Coreg.   Monitor with increased mobility 11. NSTEMI/ICM: Has significant LAD --to be treated medically with Coreg, Hydralazine, Imdur,  ASA/Plavix and Lipitor. PCI if patient progresses to ESRD.   Daily weights 12. Pseudomonas UTI: Completed IV antibiotics on 05/08.  13. BPH s/p TURP 4/13: Continue Proscar. Condom cath in place with cloudy urine in tubing. Will check PVRs to monitor voiding. Toilet every 4 hours during the day.    Monitor for retention 14. Anemia of chronic disease: On Aranesp--increased to 14mg/weekly today.   CBC ordered for tomorrow  PBary Leriche PA-C 11/25/2019  I have personally performed a face to face diagnostic evaluation, including, but not limited to relevant history and physical exam findings, of this patient and developed relevant assessment and plan.  Additionally, I have reviewed and concur with the physician assistant's documentation above.  ADelice Lesch MD, ABPMR  The patient's status has not changed. The original post admission physician evaluation remains appropriate, and any changes from the pre-admission screening or documentation from the acute chart are noted above.   ADelice Lesch MD, ABPMR

## 2019-11-25 NOTE — Progress Notes (Signed)
Nutrition Follow-up  DOCUMENTATION CODES:   Severe malnutrition in context of chronic illness, Underweight  INTERVENTION:   Continue Ensure Enlive po TID, each supplement provides 350 kcal and 20 grams of protein.  NUTRITION DIAGNOSIS:   Severe Malnutrition related to chronic illness(CKD, HF) as evidenced by energy intake < or equal to 75% for > or equal to 1 month, percent weight loss(25% weight loss within 3 months).  Ongoing  GOAL:   Patient will meet greater than or equal to 90% of their needs   Progressing  MONITOR:   PO intake, Supplement acceptance, Labs  ASSESSMENT:   69 yo male admitted 4/30 with syncopal episode. Acute onset of confusion since 11/11/19. MRI revealed new L parietal infarct. PMH includes CAD, HF, CKD-IV, DM, HTN, HLD, tachycardia, blind R eye, CVA, anemia.   Poor intake of meals ongoing. Meal intakes documented at 25%. Diet was liberalized to regular on 5/14. Patient is being offered Ensure Enlive TID between meals. Per RN documentation, he is accepting Ensure TID.   Labs reviewed. BUN 66 (H), creat 4.09 (H), K 5 WNL, Phos 2.3 (L)  Medications reviewed and include aranesp, fergon, flomax, vitamin B-12.  Weight is fluctuating due to volume status. 72.6 kg on admission, currently 77.5 kg Lowest weight since admission 61 kg on 5/13.  Diet Order:   Diet Order            Diet regular Room service appropriate? Yes with Assist; Fluid consistency: Thin  Diet effective now              EDUCATION NEEDS:   Not appropriate for education at this time  Skin:  Skin Assessment: Reviewed RN Assessment  Last BM:  5/17 type 5  Height:   Ht Readings from Last 1 Encounters:  11/06/19 6' (1.829 m)    Weight:   Wt Readings from Last 1 Encounters:  11/25/19 77.5 kg    Ideal Body Weight:  80.9 kg  BMI:  Body mass index is 23.17 kg/m.  Estimated Nutritional Needs:   Kcal:  1900-2100  Protein:  85-100 gm  Fluid:  1.8 L    Lucas Mallow,  RD, LDN, CNSC Please refer to Amion for contact information.

## 2019-11-25 NOTE — Progress Notes (Signed)
Occupational Therapy Treatment Patient Details Name: Dennis Macias MRN: [id number] DOB: [birth year] Today's Date: 11/25/2019    History of present illness Pt is a 69 y.o. male with a PMHx of CAD, ischemic cardiomyopathy, chronic systolic and diastolic heart failure, CKD stage IV, anemia of chronic disease, and BPH with chronic indwelling foley catheter s/p TURP 10/20/19. Came to ED after syncopal episode found to be hypertensive and febrile. Admitted 11/06/19. Plan for cardiac cath however creatinine levels elevated. Cardiology recommends a left heart cath eventually; will hold off for now given acutely altered mental status. Of note, pt with new AMS and a code stroke was called on 5/6. MRI of the brain revealed ischemic infarct in the left parietal region.   OT comments  Pt agreeable to OT on this date, pt sitting upright in bed upon arrival. Pt able to answer all OT questions today regarding home environment, family support, and current AE/DME. Min verbal cues required for attention during functional self-care tasks. Assessed bed mobility, functional transfers/mobility, dynamic sitting/standing balance, and ADL function including grooming, toileting, and LB dressing. Pt transitioned from sitting upright in bed>sitting EOB with Mod I using bed railing for support. Pt adjusted bilateral socks with Mod I while sitting EOB. Pt performed BSC transfer, ambulating 5 steps from bed>BSC with min guard-supervision using RW. Pt tolerated standing at sink to wash face/hands/use mouth wash with supervision for safety and min verbal cues for sequencing. Physician and family agreed that pt would benefit from CIR instead of [place], therefore follow up recommendations adjusted. Will continue to follow patient acutely as able before discharging to CIR.      Follow Up Recommendations  CIR;Supervision/Assistance - 24 hour    Equipment Recommendations  None recommended by OT    Recommendations for Other Services       Precautions / Restrictions Precautions Precautions: Fall Precaution Comments: monitor for agitation Restrictions Weight Bearing Restrictions: No       Mobility Bed Mobility Overal bed mobility: Modified Independent Bed Mobility: Supine to Sit(with HOB elevated)     Supine to sit: HOB elevated;Supervision(using bed railing for support)     General bed mobility comments: semi-reclined in bed upon arrival>transitioned to EOB>transitioned to bedside chair  Transfers Overall transfer level: Needs assistance Equipment used: Rolling walker (2 wheeled) Transfers: Sit to/from Stand Sit to Stand: Min guard;Supervision(with elevated bed) Stand pivot transfers: Min assist;Mod assist       General transfer comment: placed hands correctly/safely when transferring from one surface to the next    Balance Overall balance assessment: Needs assistance Sitting-balance support: Feet supported Sitting balance-Leahy Scale: Good Sitting balance - Comments: able to dynamically reach feet with supervision   Standing balance support: Bilateral upper extremity supported;During functional activity Standing balance-Leahy Scale: Fair Standing balance comment: able to take hands off RW to perform grooming activities while simulatneously standing/weight shifting                           ADL either performed or assessed with clinical judgement   ADL Overall ADL's : Needs assistance/impaired     Grooming: Wash/dry hands;Wash/dry face;Oral care;Supervision/safety;Standing(with RW for support) Grooming Details (indicate cue type and reason): required min verbal cues for sequencing grooming steps             Lower Body Dressing: Sitting/lateral leans;Supervision/safety Lower Body Dressing Details (indicate cue type and reason): pt adjusted bilateral socks with supervision while sitting EOB unsupported Toilet Transfer: Min guard;Cueing for safety;Ambulation;BSC;RW  Toilet Transfer  Details (indicate cue type and reason): transferred from bed to bsc; ambulating 5 steps in between with supervison for safety using RW for support         Functional mobility during ADLs: Supervision/safety;Min guard;Cueing for safety;Cueing for sequencing;Rolling walker General ADL Comments: Pt participated in grooming, LB dressing, and toileting activities; educated on energy conservation stratgies for home     Vision       Perception     Praxis      Cognition Arousal/Alertness: Awake/alert Behavior During Therapy: WFL for tasks assessed/performed Overall Cognitive Status: Within Functional Limits for tasks assessed Area of Impairment: Safety/judgement;Attention;Following commands                   Current Attention Level: Selective   Following Commands: Follows one step commands inconsistently(requires v/c's for sequencing and attention) Safety/Judgement: Decreased awareness of safety   Problem Solving: Difficulty sequencing;Requires verbal cues General Comments: reminders of sequence and safety needed        Exercises     Shoulder Instructions       General Comments pt motivated to return home and confirms having family support as needed    Pertinent Vitals/ Pain       Pain Assessment: No/denies pain  Home Living                                          Prior Functioning/Environment              Frequency  Min 2X/week        Progress Toward Goals  OT Goals(current goals can now be found in the care plan section)  Progress towards OT goals: Progressing toward goals  Acute Rehab OT Goals Patient Stated Goal: to get home soon  Plan Other (comment)(Physican/pt/family decided that CIR will be more appropriate)    Co-evaluation                 AM-PAC OT "6 Clicks" Daily Activity     Outcome Measure   Help from another person eating meals?: None Help from another person taking care of personal grooming?:  None Help from another person toileting, which includes using toliet, bedpan, or urinal?: A Little Help from another person bathing (including washing, rinsing, drying)?: A Little Help from another person to put on and taking off regular upper body clothing?: None Help from another person to put on and taking off regular lower body clothing?: A Little 6 Click Score: 21    End of Session Equipment Utilized During Treatment: Gait belt;Rolling walker  OT Visit Diagnosis: Unsteadiness on feet (R26.81);Muscle weakness (generalized) (M62.81)   Activity Tolerance Patient tolerated treatment well   Patient Left in chair;with call bell/phone within reach   Nurse Communication Mobility status        Time: 1124-1150 OT Time Calculation (min): 26 min  Charges: OT General Charges $OT Visit: 1 Visit OT Treatments $Self Care/Home Management : 23-37 mins  Michel Bickers, OTR/L [facility] [place] 11/25/2019, 2:58 PM

## 2019-11-26 ENCOUNTER — Inpatient Hospital Stay (HOSPITAL_COMMUNITY): Payer: Medicare PPO

## 2019-11-26 ENCOUNTER — Inpatient Hospital Stay (HOSPITAL_COMMUNITY): Payer: Medicare PPO | Admitting: Physical Therapy

## 2019-11-26 DIAGNOSIS — T361X5A Adverse effect of cephalosporins and other beta-lactam antibiotics, initial encounter: Secondary | ICD-10-CM

## 2019-11-26 DIAGNOSIS — R41844 Frontal lobe and executive function deficit: Secondary | ICD-10-CM

## 2019-11-26 LAB — CBC WITH DIFFERENTIAL/PLATELET
Abs Immature Granulocytes: 0.03 10*3/uL (ref 0.00–0.07)
Basophils Absolute: 0 10*3/uL (ref 0.0–0.1)
Basophils Relative: 1 %
Eosinophils Absolute: 0.1 10*3/uL (ref 0.0–0.5)
Eosinophils Relative: 2 %
HCT: 26.7 % — ABNORMAL LOW (ref 39.0–52.0)
Hemoglobin: 8.5 g/dL — ABNORMAL LOW (ref 13.0–17.0)
Immature Granulocytes: 1 %
Lymphocytes Relative: 19 %
Lymphs Abs: 1 10*3/uL (ref 0.7–4.0)
MCH: 29.9 pg (ref 26.0–34.0)
MCHC: 31.8 g/dL (ref 30.0–36.0)
MCV: 94 fL (ref 80.0–100.0)
Monocytes Absolute: 0.4 10*3/uL (ref 0.1–1.0)
Monocytes Relative: 8 %
Neutro Abs: 3.8 10*3/uL (ref 1.7–7.7)
Neutrophils Relative %: 69 %
Platelets: 200 10*3/uL (ref 150–400)
RBC: 2.84 MIL/uL — ABNORMAL LOW (ref 4.22–5.81)
RDW: 14.7 % (ref 11.5–15.5)
WBC: 5.4 10*3/uL (ref 4.0–10.5)
nRBC: 0 % (ref 0.0–0.2)

## 2019-11-26 LAB — RENAL FUNCTION PANEL
Albumin: 2.2 g/dL — ABNORMAL LOW (ref 3.5–5.0)
Anion gap: 8 (ref 5–15)
BUN: 74 mg/dL — ABNORMAL HIGH (ref 8–23)
CO2: 24 mmol/L (ref 22–32)
Calcium: 8.7 mg/dL — ABNORMAL LOW (ref 8.9–10.3)
Chloride: 102 mmol/L (ref 98–111)
Creatinine, Ser: 4.16 mg/dL — ABNORMAL HIGH (ref 0.61–1.24)
GFR calc Af Amer: 16 mL/min — ABNORMAL LOW (ref >60)
GFR calc non Af Amer: 14 mL/min — ABNORMAL LOW (ref >60)
Glucose, Bld: 268 mg/dL — ABNORMAL HIGH (ref 70–99)
Phosphorus: 2.3 mg/dL — ABNORMAL LOW (ref 2.5–4.6)
Potassium: 5.4 mmol/L — ABNORMAL HIGH (ref 3.5–5.1)
Sodium: 134 mmol/L — ABNORMAL LOW (ref 135–145)

## 2019-11-26 MED ORDER — PRO-STAT SUGAR FREE PO LIQD
30.0000 mL | Freq: Two times a day (BID) | ORAL | Status: DC
  Administered 2019-11-26 – 2019-12-04 (×15): 30 mL via ORAL
  Filled 2019-11-26 (×16): qty 30

## 2019-11-26 NOTE — Progress Notes (Signed)
Inpatient Rehabilitation  Patient information reviewed and entered into eRehab system by Javien Tesch M. Shoua Ulloa, M.A., CCC/SLP, PPS Coordinator.  Information including medical coding, functional ability and quality indicators will be reviewed and updated through discharge.    

## 2019-11-26 NOTE — Evaluation (Signed)
Physical Therapy Assessment and Plan  Patient Details  Name: Dennis Macias MRN: [id number] Date of Birth: 09/29/1950  PT Diagnosis: Abnormal posture, Abnormality of gait, Difficulty walking and Muscle weakness Rehab Potential: Excellent ELOS: ~7 days   Today's Date: 11/26/2019 PT Individual Time: 0805-0905 PT Individual Time Calculation (min): 60 min    Problem List:  Patient Active Problem List   Diagnosis Date Noted  . Toxic metabolic encephalopathy 85/08/7739  . Anemia of chronic disease   . Benign essential HTN   . AKI (acute kidney injury) ([place])   . Protein-calorie malnutrition, severe 11/19/2019  . Acute ischemic left ICA stroke ([place])   . Bacteremia   . Hypertensive urgency   . Gait disorder   . Encephalopathies   . Chronic combined systolic and diastolic heart failure ([place])   . Non-STEMI (non-ST elevated myocardial infarction) ([place])   . Syncope 11/06/2019  . Acute combined systolic and diastolic congestive heart failure ([place]) 10/21/2019  . Ischemic cardiomyopathy: Probable 10/21/2019  . Pleural effusion transudative   . S/P TURP   . Cardiorenal syndrome   . Dyspnea 10/15/2019  . Elevated TSH 09/29/2019  . Pleural effusion 09/21/2019  . Fatigue 09/17/2019  . Hematuria, unspecified 08/13/2019  . Nephrotic syndrome 08/05/2019  . Protein calorie malnutrition ([place]) 07/31/2019  . Left renal mass 07/21/2019  . CKD (chronic kidney disease) stage 4, GFR 15-29 ml/min (HCC) 04/23/2019  . Hip weakness 08/13/2018  . Pernicious anemia 02/24/2018  . Weight loss, non-intentional 08/26/2017  . 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 Syncope and collapse 02/26/2017  . BPH with urinary obstruction   . Normocytic anemia 12/21/2016  . 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 History of stroke 09/13/2015  . Hyperlipidemia 09/13/2015  . Diabetes mellitus with retinopathy of both eyes ([place])   . Essential hypertension     Past Medical History:  Past Medical  History:  Diagnosis Date  . Anemia   . Arthritis    past hx   . Blindness    right eye  . Cataract    removed both eyes  . Dehydration   . Diabetes ([place])   . Glaucoma   . History of CVA (cerebrovascular accident) 09/13/2015  . History of urinary retention   . Hyperlipidemia   . Hypertension   . Pernicious anemia 02/24/2018  . Stroke [facility])    2017- March  . Syncope 11/2019  . Tachycardia 08/26/2017  . Tubular adenoma of colon 02/2017  . Weight loss, non-intentional 08/26/2017   10 lbs between 6/18 & 2/19   Past Surgical History:  Past Surgical History:  Procedure Laterality Date  . CATARACT EXTRACTION, BILATERAL    . COLONOSCOPY    . IR THORACENTESIS ASP PLEURAL SPACE W/IMG GUIDE  09/21/2019  . IR THORACENTESIS ASP PLEURAL SPACE W/IMG GUIDE  10/16/2019  . LEFT HEART CATH AND CORONARY ANGIOGRAPHY N/A 11/19/2019   Procedure: LEFT HEART CATH AND CORONARY ANGIOGRAPHY;  Surgeon: Jettie Booze, MD;  Location: [facility];  Service: Cardiovascular;  Laterality: N/A;  . POLYPECTOMY    . REFRACTIVE SURGERY  10/2017  . TEE WITHOUT CARDIOVERSION N/A 09/14/2015   Procedure: TRANSESOPHAGEAL ECHOCARDIOGRAM (TEE);  Surgeon: Larey Dresser, MD;  Location: [facility];  Service: Cardiovascular;  Laterality: N/A;  . TRANSURETHRAL RESECTION OF PROSTATE N/A 10/20/2019   Procedure: TRANSURETHRAL RESECTION OF THE PROSTATE (TURP);  Surgeon: Irine Seal, MD;  Location: [facility];  Service: Urology;  Laterality: N/A;  . UPPER GASTROINTESTINAL ENDOSCOPY  Assessment & Plan Clinical Impression: Patient is a 69 y.o. year old RH male with history of HTN, T2DM, BPH-s/p TURP with Foley placement, prior CVA, CKD- baseline SCR 3.3-3.7,  recent admission for anasarca s/p thoracocentesis with IV diuresis; who was admitted on 11/06/2019 due to syncope. History taken from chart review and patient due to cognition. He was found to have elevated troponin due to NSTEMI as well as frequent PVCs with NSVT.  UDS  negative.  He developed fevers with chills past admission and was started on IV cefepime for bacteremia due to Pseudomonas UTI.  He had progressive lethargy with confusion and on 11/11/19 he was noted to have BUE tremors due to metabolic encephalopathy.  He was found to have global aphasia on 05/06 and MRI/MRA brain done revealing 10 mm left parietal lobe subcortical infarct, chronic left thalamic infarct and advanced small vessel disease.  EEG was suggestive of moderate diffuse nonspecific encephalopathy.  Neurology following for input and felt that a MS and speech issues not due to stroke and that encephalopathy was related to cefepime and antibiotics changed to [place].   Stroke was felt to be an incidental finding due to small vessel disease but due to recurrent stroke and history of cardiomyopathy--Dr. Leonie Man recommended 30-day cardiac event monitor to evaluate for PAF.  Cardiology expressed concerns that ventricular tachycardia in setting of cardiomyopathy could be cause of syncope but cath was placed on hold with recommendations of medical treatment for NSTEMI. Nephrology was consulted for input on acute on chronic renal failure which is resolving with IV fluids for hydration.  He underwent cardiac cath on 5/13 revealing 75% stenosis in mid LAD and 80% stenosis ostial LAD to proximal LAD.  SCr peaked to 4.10 post cath and as patient without chest pain,  medically therapy recommended with recommendations to consider PCI if he progresses to HD.  Anemia of chronic disease has been monitored and Aranesp increased drop in H&H to 7.9/25.1.  Mentation is improving and agitation has resolved.  Verbal output has improved and is tolerating regular textures. Therapy ongoing and patient is showing improvement in participation. CIR recommended due to functional decline. Patient transferred to CIR on 11/25/2019 .   Patient currently requires min with mobility secondary to muscle weakness, decreased cardiorespiratoy  endurance, impaired timing and sequencing and unbalanced muscle activation and decreased standing balance, decreased postural control and decreased balance strategies.  Prior to hospitalization, patient was modified independent  with mobility and lived with Significant other in a [place] home.  Home access is 6Stairs to enter.  Patient will benefit from skilled PT intervention to maximize safe functional mobility, minimize fall risk and decrease caregiver burden for planned discharge home with 24 hour supervision.  Anticipate patient will benefit from follow up [place] at discharge.  PT - End of Session Activity Tolerance: Tolerates 30+ min activity with multiple rests Endurance Deficit: Yes Endurance Deficit Description: requires seated rest breaks between activities PT Assessment Rehab Potential (ACUTE/IP ONLY): Excellent PT Barriers to Discharge: [place] home environment;Decreased caregiver support;Home environment access/layout;Medical stability PT Patient demonstrates impairments in the following area(s): Balance;Safety;Behavior;Sensory;Edema;Skin Integrity;Endurance;Motor;Nutrition;Pain;Perception PT Transfers Functional Problem(s): Bed Mobility;Bed to Chair;Car;Furniture;Floor PT Locomotion Functional Problem(s): Ambulation;Stairs PT Plan PT Intensity: Minimum of 1-2 x/day ,45 to 90 minutes PT Frequency: 5 out of 7 days PT Duration Estimated Length of Stay: ~7 days PT Treatment/Interventions: Ambulation/gait training;Community reintegration;DME/adaptive equipment instruction;Neuromuscular re-education;Psychosocial support;Stair training;UE/LE Strength taining/ROM;Balance/vestibular training;Discharge planning;Pain management;Functional electrical stimulation;Skin care/wound management;Therapeutic Activities;UE/LE Coordination activities;Cognitive remediation/compensation;Disease management/prevention;Functional mobility training;Patient/family  education;Splinting/orthotics;Visual/perceptual  remediation/compensation;Therapeutic Exercise PT Transfers Anticipated Outcome(s): supervision PT Locomotion Anticipated Outcome(s): supervision PT Recommendation Follow Up Recommendations: Home health PT;24 hour supervision/assistance Patient destination: Home Equipment Recommended: To be determined Equipment Details: has quad-cane and RW  Skilled Therapeutic Intervention Evaluation completed (see details above and below) with education on PT POC and goals and individual treatment initiated with focus on bed mobility, transfers, gait training, stair navigation, activity tolerance, and pt education regarding daily therapy schedule, weekly team meetings, purpose of PT evaluation, and other CIR information. Pt received supine in bed and agreeable to therapy session. Pt noted to be HOH. Supine>sitting L EOB, HOB flat and not using bedrails, with supervision and increased time. L stand pivot to w/c, no AD, with min assist for balance.  Transported to/from gym in w/c for time management and energy conservation. Gait training 2108f L HHA with min assist for balance - demonstrates minor R LOB when turning - slightly wide BOS and decreased B LE step lengths with guarded movement in trunk. Ascended/descended 8 steps (6" height) using B HRs (pt did not feel safe using just 1 as per home) with self-selected step-to pattern leading with R LE on ascent and L LE on descent - min assist for balance throughout. Transported to ortho gym. Ambulatory simulated car transfer (SUV height) with HHA and min assist for balance. Ambulated ~113fup/down ramp, ~1076f2 over mulch, up/down curb step with HHA and pt reaching for railing for safety with min assist for balance. Pt demonstrates ability to pick up object from floor with min assist for balance. Therapist retrieved w/c cushion for pressure relief and increased OOB activity tolerance. With encouragement pt agreeable to remain  seated in w/c - left with needs in reach and seat belt alarm on.  PT Evaluation Precautions/Restrictions Precautions Precautions: Fall Restrictions Weight Bearing Restrictions: No Pain Pain Assessment Pain Scale: 0-10 Pain Score: 0-No pain Home Living/Prior Functioning Home Living Available Help at Discharge: Family Type of Home: Apartment Home Access: Stairs to enter EntTechnical brewer Steps: 6 Entrance Stairs-Rails: Right(HR on R side in front of house and on L side in back of house) Home Layout: One level  Lives With: Significant other(girlfriend, MarStanton Kidneyrior Function Level of Independence: Independent with gait;Independent with transfers;Requires assistive device for independence(used narrow based QC or RW more so in community)  Able to Take Stairs?: Yes Driving: No Comments: enjoys going outside and sitting Vision/Perception  Vision - Assessment Additional Comments: pt reporting blind in R eye at baseline Perception Perception: Within Functional Limits Praxis Praxis: Intact  Cognition Overall Cognitive Status: No family/caregiver present to determine baseline cognitive functioning Arousal/Alertness: Awake/alert Orientation Level: Oriented X4(situation pt states "they say I passed out" pt knows month and year but note date or day of week) Attention: Focused;Sustained Focused Attention: Appears intact Safety/Judgment: Appears intact Sensation Sensation Light Touch: Appears Intact(appears intact on light touch screen) Hot/Cold: Not tested Proprioception: Impaired by gross assessment(impaired awareness of B LEs during mobility tasks) Stereognosis: Not tested Coordination Gross Motor Movements are Fluid and Coordinated: No Fine Motor Movements are Fluid and Coordinated: No Coordination and Movement Description: impaired due to impaired balance and generalized deconditioing Heel Shin Test: symmetrical Motor  Motor Motor: Abnormal postural alignment and  control  Mobility Bed Mobility Bed Mobility: Supine to Sit;Sit to Supine Supine to Sit: Supervision/Verbal cueing Sit to Supine: Supervision/Verbal cueing Transfers Transfers: Sit to Stand;Stand to Sit;Stand Pivot Transfers Sit to Stand: Minimal Assistance - Patient > 75% Stand to Sit: Minimal Assistance -  Patient > 75% Stand Pivot Transfers: Minimal Assistance - Patient > 75% Stand Pivot Transfer Details: Manual facilitation for weight shifting;Tactile cues for sequencing;Tactile cues for weight shifting;Tactile cues for initiation;Tactile cues for posture;Tactile cues for placement Transfer (Assistive device): None Locomotion  Gait Ambulation: Yes Gait Assistance: Minimal Assistance - Patient > 75% Gait Distance (Feet): 204 Feet Assistive device: 1 person hand held assist Gait Assistance Details: Tactile cues for weight shifting;Tactile cues for posture;Verbal cues for gait pattern;Verbal cues for technique Gait Gait: Yes Gait Pattern: Impaired Gait Pattern: Decreased stride length;Decreased step length - left;Decreased step length - right;Poor foot clearance - right;Poor foot clearance - left;Wide base of support Gait velocity: decreased Stairs / Additional Locomotion Stairs: Yes Stairs Assistance: Minimal Assistance - Patient > 75% Stair Management Technique: Two rails Number of Stairs: 8 Height of Stairs: 6 Ramp: Minimal Assistance - Patient >75% Curb: Minimal Assistance - Patient >75% Wheelchair Mobility Wheelchair Mobility: No  Trunk/Postural Assessment  Cervical Assessment Cervical Assessment: Exceptions to WFL(forward head) Thoracic Assessment Thoracic Assessment: Exceptions to WFL(rounded shoulders) Lumbar Assessment Lumbar Assessment: Exceptions to WFL(posterior pelvic tilt) Postural Control Postural Control: Deficits on evaluation Postural Limitations: decreased  Balance Balance Balance Assessed: Yes Static Sitting Balance Static Sitting - Level of  Assistance: 5: Stand by assistance Dynamic Sitting Balance Dynamic Sitting - Level of Assistance: 4: Min assist Static Standing Balance Static Standing - Balance Support: During functional activity Static Standing - Level of Assistance: 4: Min assist Dynamic Standing Balance Dynamic Standing - Balance Support: During functional activity Dynamic Standing - Level of Assistance: 3: Mod assist;4: Min assist Extremity Assessment      RLE Assessment RLE Assessment: Exceptions to [facility] Active Range of Motion (AROM) Comments: WFL RLE Strength Right Hip Flexion: 4/5 Right Knee Flexion: 4-/5 Right Knee Extension: 4/5 Right Ankle Dorsiflexion: 3+/5 Right Ankle Plantar Flexion: 3+/5 LLE Assessment LLE Assessment: Exceptions to [facility] Active Range of Motion (AROM) Comments: WFL LLE Strength Left Hip Flexion: 4/5 Left Knee Flexion: 4-/5 Left Knee Extension: 4/5 Left Ankle Dorsiflexion: 3+/5 Left Ankle Plantar Flexion: 3+/5    Refer to Care Plan for Long Term Goals  Recommendations for other services: None   Discharge Criteria: Patient will be discharged from PT if patient refuses treatment 3 consecutive times without medical reason, if treatment goals not met, if there is a change in medical status, if patient makes no progress towards goals or if patient is discharged from hospital.  The above assessment, treatment plan, treatment alternatives and goals were discussed and mutually agreed upon: by patient  Tawana Scale, PT, DPT 11/26/2019, 7:50 AM

## 2019-11-26 NOTE — Progress Notes (Signed)
Physical Therapy Session Note  Patient Details  Name: Dennis Macias MRN: [id number] Date of Birth: November 03, 1950  Today's Date: 11/26/2019 PT Individual Time: 8377-9396 PT Individual Time Calculation (min): 69 min   Short Term Goals: Week 1:     Skilled Therapeutic Interventions/Progress Updates:    Patient received up in [facility], eager to mobilize and requesting to get back to bed at end of session. Generally required ModA for functional stand to sit but once up with RW, able to transfer and tolerated gait training multiple bouts of 50-122f with RW and min guard for safety. Performed berg balance test (only able to score 16/56) and practiced aspects which patient would allow however with very poor frustration tolerance especially with balance based tasks and often either needed heavy amounts of assist for challenging activities or was unable to even safely attempt some balance tasks on test. Educated and encouraged on benefits of being in CIR especially given deficits as he often stated "I should have just gone home" when frustrated with himself. Tolerated riding Nustep for 6 minutes on level 2 with BUEs and LEs, then [facility] and returned to bed with Min-ModA. Left in bed positioned to comfort with all needs met, bed alarm active this morning.   Therapy Documentation Precautions:  Precautions Precautions: Fall Restrictions Weight Bearing Restrictions: No Pain: Pain Assessment Pain Scale: 0-10 Pain Score: 0-No pain    Therapy/Group: Individual Therapy   KWindell Norfolk DPT, PN1   Supplemental Physical Therapist [place]   Pager [phone] Rehab Office [phone]   11/26/2019, 12:29 PM

## 2019-11-26 NOTE — Progress Notes (Signed)
+/-   sleep. BUE edema. Condom cath in place from admit, will discontinue after breakfast. Calls for assistance. No attempts OOB bed. Patrici Ranks A

## 2019-11-26 NOTE — Plan of Care (Signed)
  Problem: RH BOWEL ELIMINATION Goal: RH STG MANAGE BOWEL WITH ASSISTANCE Description: STG Manage Bowel with min assist   Outcome: Progressing Goal: RH STG MANAGE BOWEL W/MEDICATION W/ASSISTANCE Description: STG Manage Bowel with Medication with Assistance. Outcome: Progressing   Problem: RH BLADDER ELIMINATION Goal: RH STG MANAGE BLADDER WITH ASSISTANCE Description: STG Manage Bladder With min assist   Outcome: Progressing   Problem: RH SAFETY Goal: RH STG ADHERE TO SAFETY PRECAUTIONS W/ASSISTANCE/DEVICE Description: STG Adhere to Safety Precautions With supervision   Outcome: Progressing   Problem: RH COGNITION-NURSING Goal: RH STG USES MEMORY AIDS/STRATEGIES W/ASSIST TO PROBLEM SOLVE Description: STG Uses Memory Aids/Strategies With supervision to Problem Solve. Outcome: Progressing

## 2019-11-26 NOTE — Progress Notes (Signed)
[place] PHYSICAL MEDICINE & REHABILITATION PROGRESS NOTE   Subjective/Complaints:   Pt reports no pain.  LBM was late 5/17-  So needs to go.   Denies constipation.   Urinating OK per pt.   ROS:  Pt denies SOB, abd pain, CP, N/V/C/D, and vision changes   Objective:   No results found. No results for input(s): WBC, HGB, HCT, PLT in the last 72 hours. Recent Labs    11/24/19 0358 11/25/19 0404  NA 139 136  K 4.7 5.0  CL 106 104  CO2 25 24  GLUCOSE 280* 218*  BUN 54* 66*  CREATININE 4.07* 4.09*  CALCIUM 8.6* 8.6*    Intake/Output Summary (Last 24 hours) at 11/26/2019 0951 Last data filed at 11/26/2019 0506 Gross per 24 hour  Intake 540 ml  Output 600 ml  Net -60 ml     Physical Exam: Vital Signs Blood pressure 124/62, pulse 60, temperature 97.8 F (36.6 C), temperature source Oral, resp. rate 16, height 6' (1.829 m), weight 79.3 kg, SpO2 100 %.   Physical Exam  Nursing note and vitals reviewed. Constitutional: pt awake, slowed processing, sitting up in bed; appropriate otherwise, NAD  HENT: blind R eye- pupil nonreactive  CV:  RRR- no JVD Respiratory: CTA B/L GI: soft, slightly distension; NT; hypoactive BS  Musculoskeletal:     Comments: 1+ pitting edema right forearm.   Neurological: vague; slow to respond;   Slow to respond --decreased hearing noted.  He lacks insight/awareness of deficits.  Motor: 4+/5 throughout  Skin: Skin is warm and dry.  Stasis changes bilateral shins.   Psychiatric: very flat affect   Assessment/Plan: 1. Functional deficits secondary to toxic metabolic encephalopathy which require 3+ hours per day of interdisciplinary therapy in a comprehensive inpatient rehab setting.  Physiatrist is providing close team supervision and 24 hour management of active medical problems listed below.  Physiatrist and rehab team continue to assess barriers to discharge/monitor patient progress toward functional and medical goals  Care  Tool:  Bathing              Bathing assist       Upper Body Dressing/Undressing Upper body dressing   What is the patient wearing?: Hospital gown only    Upper body assist Assist Level: Minimal Assistance - Patient > 75%    Lower Body Dressing/Undressing Lower body dressing      What is the patient wearing?: Hospital gown only     Lower body assist       Toileting Toileting    Toileting assist Assist for toileting: Minimal Assistance - Patient > 75%     Transfers Chair/bed transfer  Transfers assist  Chair/bed transfer activity did not occur: N/A        Locomotion Ambulation   Ambulation assist   Ambulation activity did not occur: N/A          Walk 10 feet activity   Assist           Walk 50 feet activity   Assist           Walk 150 feet activity   Assist           Walk 10 feet on uneven surface  activity   Assist           Wheelchair     Assist               Wheelchair 50 feet with 2 turns activity    Assist  Wheelchair 150 feet activity     Assist          Blood pressure 124/62, pulse 60, temperature 97.8 F (36.6 C), temperature source Oral, resp. rate 16, height 6' (1.829 m), weight 79.3 kg, SpO2 100 %.  Medical Problem List and Plan: 1.  Deficits with mobility, transfers, self-care secondary to debility with metabolic encephalopathy.             -patient may shower             -ELOS/Goals: 7-10 days/Supervision/Mod I             Admit to CIR 2.  Antithrombotics: -DVT/anticoagulation:  Pharmaceutical: Heparin             -antiplatelet therapy: DAPT 3. Pain Management: Tylenol prn.  4. Mood: LCSW to follow for evaluation and support.              -antipsychotic agents: N/A 5. Neuropsych: This patient is not fully capable of making decisions on his own behalf. 6. Skin/Wound Care: Routine pressure relief measures.  7. Fluids/Electrolytes/Nutrition: Monitor I/Os.  Monitor for signs of overload- check daily weights.  Monitor renal status for recovery.  8. Cefipime induced encephalopathy: Improving 9. Acute on chronic renal failure: Followed by Dr. Hollie Salk. SCr-40.9 today.               CMP ordered for tomorrow.   5/20- Cr stable at 4.09- was 4.07- BUN worse at 66- was 54. Renal signed off- will recheck this weekend.  10 HTN: Monitor BP tid--on Norvasc, Imdur, Hydralazine and Coreg.  5/20- BP well controlled- 120s/60s              Monitor with increased mobility 11. NSTEMI/ICM: Has significant LAD --to be treated medically with Coreg, Hydralazine, Imdur, ASA/Plavix and Lipitor. PCI if patient progresses to ESRD.              Daily weights 12. Pseudomonas UTI: Completed IV antibiotics on 05/08.  13. BPH s/p TURP 4/13: Continue Proscar. Condom cath in place with cloudy urine in tubing. Will check PVRs to monitor voiding. Toilet every 4 hours during the day.    5/20- PVRs show very low post void residuals             Monitor for retention 14. Anemia of chronic disease: On Aranesp--increased to 118mcg/weekly today.              CBC ordered for tomorrow  5/20- Hb 7.9- aranesp just increased due to dwindling Hb.     LOS: 1 days A FACE TO FACE EVALUATION WAS PERFORMED  Dennis Macias 11/26/2019, 9:51 AM

## 2019-11-26 NOTE — Evaluation (Signed)
Occupational Therapy Assessment and Plan  Patient Details  Name: Dennis Macias MRN: [id number] Date of Birth: 1950/11/12  OT Diagnosis: abnormal posture, disturbance of vision and muscle weakness (generalized) Rehab Potential:   ELOS: 6-9   Today's Date: 11/26/2019 OT Individual Time: 1330-1430 OT Individual Time Calculation (min): 60 min     Problem List:  Patient Active Problem List   Diagnosis Date Noted  . Toxic metabolic encephalopathy 37/35/7897  . Anemia of chronic disease   . Benign essential HTN   . AKI (acute kidney injury) ([place])   . Protein-calorie malnutrition, severe 11/19/2019  . Acute ischemic left ICA stroke ([place])   . Bacteremia   . Hypertensive urgency   . Gait disorder   . Encephalopathies   . Chronic combined systolic and diastolic heart failure ([place])   . Non-STEMI (non-ST elevated myocardial infarction) ([place])   . Syncope 11/06/2019  . Acute combined systolic and diastolic congestive heart failure ([place]) 10/21/2019  . Ischemic cardiomyopathy: Probable 10/21/2019  . Pleural effusion transudative   . S/P TURP   . Cardiorenal syndrome   . Dyspnea 10/15/2019  . Elevated TSH 09/29/2019  . Pleural effusion 09/21/2019  . Fatigue 09/17/2019  . Hematuria, unspecified 08/13/2019  . Nephrotic syndrome 08/05/2019  . Protein calorie malnutrition ([place]) 07/31/2019  . Left renal mass 07/21/2019  . CKD (chronic kidney disease) stage 4, GFR 15-29 ml/min (H[facility]) 04/23/2019  . Hip weakness 08/13/2018  . Pernicious anemia 02/24/2018  . Weight loss, non-intentional 08/26/2017  . 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 Syncope and collapse 02/26/2017  . BPH with urinary obstruction   . Normocytic anemia 12/21/2016  . 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 History of stroke 09/13/2015  . Hyperlipidemia 09/13/2015  . Diabetes mellitus with retinopathy of both eyes ([place])   . Essential hypertension     Past Medical History:  Past Medical History:   Diagnosis Date  . Anemia   . Arthritis    past hx   . Blindness    right eye  . Cataract    removed both eyes  . Dehydration   . Diabetes ([place])   . Glaucoma   . History of CVA (cerebrovascular accident) 09/13/2015  . History of urinary retention   . Hyperlipidemia   . Hypertension   . Pernicious anemia 02/24/2018  . Stroke [facility])    2017- March  . Syncope 11/2019  . Tachycardia 08/26/2017  . Tubular adenoma of colon 02/2017  . Weight loss, non-intentional 08/26/2017   10 lbs between 6/18 & 2/19   Past Surgical History:  Past Surgical History:  Procedure Laterality Date  . CATARACT EXTRACTION, BILATERAL    . COLONOSCOPY    . IR THORACENTESIS ASP PLEURAL SPACE W/IMG GUIDE  09/21/2019  . IR THORACENTESIS ASP PLEURAL SPACE W/IMG GUIDE  10/16/2019  . LEFT HEART CATH AND CORONARY ANGIOGRAPHY N/A 11/19/2019   Procedure: LEFT HEART CATH AND CORONARY ANGIOGRAPHY;  Surgeon: Jettie Booze, MD;  Location: [facility];  Service: Cardiovascular;  Laterality: N/A;  . POLYPECTOMY    . REFRACTIVE SURGERY  10/2017  . TEE WITHOUT CARDIOVERSION N/A 09/14/2015   Procedure: TRANSESOPHAGEAL ECHOCARDIOGRAM (TEE);  Surgeon: Larey Dresser, MD;  Location: [facility];  Service: Cardiovascular;  Laterality: N/A;  . TRANSURETHRAL RESECTION OF PROSTATE N/A 10/20/2019   Procedure: TRANSURETHRAL RESECTION OF THE PROSTATE (TURP);  Surgeon: Irine Seal, MD;  Location: [facility];  Service: Urology;  Laterality: N/A;  . UPPER GASTROINTESTINAL ENDOSCOPY  Assessment & Plan Clinical Impression:  Pt is a 69 y.o. male with a PMHx of CAD, ischemic cardiomyopathy, chronic systolic and diastolic heart failure, CKD stage IV, anemia of chronic disease, and BPH with chronic indwelling foley catheter s/p TURP 10/20/19. Came to ED after syncopal episode found to be hypertensive and febrile. Admitted 11/06/19. Plan for cardiac cath however creatinine levels elevated. Cardiology recommends a left heart cath eventually;  will hold off for now given acutely altered mental status. Of note, pt with new AMS and a code stroke was called on 5/6. MRI of the brain revealed ischemic infarct in the left parietal region  Patient currently requires min- max with basic self-care skills secondary to muscle weakness, decreased cardiorespiratoy endurance, decreased coordination, decreased visual acuity, decreased memory and decreased sitting balance, decreased standing balance, decreased postural control and decreased balance strategies.  Prior to hospitalization, patient could complete BADL with modified independent .  Patient will benefit from skilled intervention to decrease level of assist with basic self-care skills and increase independence with basic self-care skills prior to discharge home with care partner.  Anticipate patient will require 24 hour supervision and follow up home health.  OT - End of Session Activity Tolerance: Tolerates 30+ min activity with multiple rests Endurance Deficit: Yes OT Assessment OT Barriers to Discharge: Decreased caregiver support OT Patient demonstrates impairments in the following area(s): Balance;Endurance;Motor;Safety;Vision OT Basic ADL's Functional Problem(s): Grooming;Bathing;Dressing;Toileting OT Transfers Functional Problem(s): Toilet;Tub/Shower OT Additional Impairment(s): Fuctional Use of Upper Extremity OT Plan OT Intensity: Minimum of 1-2 x/day, 45 to 90 minutes OT Frequency: 5 out of 7 days OT Duration/Estimated Length of Stay: 6-9 OT Treatment/Interventions: Balance/vestibular training;Discharge planning;Pain management;Self Care/advanced ADL retraining;Therapeutic Activities;UE/LE Coordination activities;Visual/perceptual remediation/compensation;Therapeutic Exercise;Skin care/wound managment;Patient/family education;Functional mobility training;Disease mangement/prevention;Cognitive remediation/compensation;Community reintegration;DME/adaptive equipment  instruction;Neuromuscular re-education;Psychosocial support;Splinting/orthotics;UE/LE Strength taining/ROM;Wheelchair propulsion/positioning OT Self Feeding Anticipated Outcome(s): S OT Basic Self-Care Anticipated Outcome(s): S OT Toileting Anticipated Outcome(s): S OT Bathroom Transfers Anticipated Outcome(s): S OT Recommendation Follow Up Recommendations: Home health OT Equipment Recommended: 3 in 1 bedside comode   Skilled Therapeutic Intervention 1:1. Pt received in bed. Pt HOH and requries repetition for education on OT role/purpose, CIR, ELOS, POC and CVA recovery. Pt completes supine>sitting with SBA. Pt sit to stand from EOB with CGA with VC for placement of hands on bed/RW. Pt completes transfer onto TTB in walk in shower with CGA and bathes with A only for sit to stand from low TTB seat (MOD A). Pt dreses at sink with set up for UB and MAX A for LB seated in w/c. Pt completes 9HPT and results listed below. Pt will benefit from adapative strategies for LB dressing and activity tolerance training prior to return home with S. Exited session with pt seated in bed, exit alarm on and call light inr each  OT Evaluation Precautions/Restrictions  Precautions Precautions: Fall Precaution Comments: monitor for agitation Restrictions Weight Bearing Restrictions: No General Chart Reviewed: Yes Family/Caregiver Present: No Vital Signs Therapy Vitals Temp: (!) 97.5 F (36.4 C) Temp Source: Oral Pulse Rate: 65 Resp: 14 BP: 137/67 Patient Position (if appropriate): Lying Oxygen Therapy SpO2: 100 % O2 Device: Room Air Pain Pain Assessment Pain Scale: 0-10 Pain Score: 0-No pain Home Living/Prior Functioning Home Living Family/patient expects to be discharged to:: Private residence Living Arrangements: Spouse/significant other Available Help at Discharge: Family Type of Home: Apartment Home Access: Stairs to enter Technical brewer of Steps: 6 Entrance Stairs-Rails: Right(HR  on R side in front of house and on L side  in back of house) Home Layout: One level  Lives With: Significant other(girlfriend, [name]) Prior Function Level of Independence: Independent with gait, Independent with transfers, Requires assistive device for independence(used narrow based QC or RW more so in community)  Able to Take Stairs?: Yes Driving: No Comments: enjoys going outside and sitting ADL ADL Upper Body Bathing: Setup Where Assessed-Upper Body Bathing: Shower Lower Body Bathing: Moderate assistance Where Assessed-Lower Body Bathing: Shower Upper Body Dressing: Supervision/safety Where Assessed-Upper Body Dressing: Sitting at sink, Wheelchair Lower Body Dressing: Maximal assistance Where Assessed-Lower Body Dressing: Sitting at sink, Medical laboratory scientific officer: Minimal assistance Tub/Shower Transfer: Minimal assistance Tub/Shower Transfer Method: Stand pivot Tub/Shower Equipment: Radio broadcast assistant Vision Baseline Vision/History: Wears glasses Wears Glasses: Reading only Patient Visual Report: No change from baseline Vision Assessment?: Vision impaired- to be further tested in functional context Additional Comments: pt reporting blind in R eye at baseline, pt able ot read words on menu and state clock on wall, decreased smoothness of diagonal tracking Perception  Perception: Within Functional Limits Praxis Praxis: Intact   Cognition Overall Cognitive Status: No family/caregiver present to determine baseline cognitive functioning Arousal/Alertness: Awake/alert Orientation Level: Person;Place;Situation Person: Oriented Place: Oriented Situation: Oriented Year: 2021 Month: May Day of Week: Correct Immediate Memory Recall: Sock;Blue;Bed Memory Recall Sock: Without Cue Memory Recall Blue: Without Cue Memory Recall Bed: Not able to recall Attention: Focused;Sustained Focused Attention: Appears intact Safety/Judgment: Appears intact Sensation Sensation Light Touch:  Appears Intact Hot/Cold: Appears Intact Proprioception: Impaired by gross assessment Coordination Gross Motor Movements are Fluid and Coordinated: No Fine Motor Movements are Fluid and Coordinated: No Finger Nose Finger Test: overshooting target with BUE Heel Shin Test: symmetrical 9 Hole Peg Test: R- 43.0 L-58.5 Motor  Motor Motor: Abnormal postural alignment and control Mobility  Transfers Sit to Stand: Moderate Assistance - Patient 50-74%;Minimal Assistance - Patient > 75%  Trunk/Postural Assessment  Cervical Assessment Cervical Assessment: (head forward) Thoracic Assessment Thoracic Assessment: (rounded shoudlers) Lumbar Assessment Lumbar Assessment: (posterior pelvic tilt) Postural Control Postural Control: Deficits on evaluation(decreased)  Balance Standardized Balance Assessment Standardized Balance Assessment: Berg Balance Test Berg Balance Test Sit to Stand: Needs moderate or maximal assist to stand Standing Unsupported: Able to stand 2 minutes with supervision Sitting with Back Unsupported but Feet Supported on Floor or Stool: Able to sit 2 minutes under supervision Stand to Sit: Controls descent by using hands Transfers: Needs one person to assist Standing Unsupported with Eyes Closed: Able to stand 10 seconds with supervision Standing Ubsupported with Feet Together: Able to place feet together independently but unable to hold for 30 seconds From Standing, Reach Forward with Outstretched Arm: Loses balance while trying/requires external support From Standing Position, Pick up Object from Floor: Unable to try/needs assist to keep balance From Standing Position, Turn to Look Behind Over each Shoulder: Needs supervision when turning Turn 360 Degrees: Needs assistance while turning Standing Unsupported, Alternately Place Feet on Step/Stool: Needs assistance to keep from falling or unable to try Standing Unsupported, One Foot in Front: Loses balance while stepping or  standing Standing on One Leg: Unable to try or needs assist to prevent fall Total Score: 16 Extremity/Trunk Assessment RUE Assessment RUE Assessment: Exceptions to [facility] General Strength Comments: generalized weakness LUE Assessment LUE Assessment: Exceptions to [facility] General Strength Comments: generalized weakness     Refer to Care Plan for Long Term Goals  Recommendations for other services: None    Discharge Criteria: Patient will be discharged from OT if patient refuses treatment 3 consecutive  times without medical reason, if treatment goals not met, if there is a change in medical status, if patient makes no progress towards goals or if patient is discharged from hospital.  The above assessment, treatment plan, treatment alternatives and goals were discussed and mutually agreed upon: by patient  Tonny Branch 11/26/2019, 2:31 PM

## 2019-11-26 NOTE — Telephone Encounter (Addendum)
**Note De-Identified  Obfuscation** Per TOC Transition note in the pts chart dated yesterday 11/25/2019:  Clinical Narrative:  Insurance has approved the patient for CIR.Patient will transition to inpatient rehab today. Staff RN aware and patient aware of transition. No further needs from Case Manager at this time.     Final next level of care: IP Rehab Facility Barriers to Discharge: Barriers Resolved   Patient Goals and CMS Choice Patient states their goals for this hospitalization and ongoing recovery are:: to return home  This TCM call is not needed as the pt was discharged to a nursing/Rehab facility.

## 2019-11-27 ENCOUNTER — Inpatient Hospital Stay (HOSPITAL_COMMUNITY): Payer: Medicare PPO | Admitting: Speech Pathology

## 2019-11-27 ENCOUNTER — Inpatient Hospital Stay (HOSPITAL_COMMUNITY): Payer: Medicare PPO

## 2019-11-27 ENCOUNTER — Inpatient Hospital Stay (HOSPITAL_COMMUNITY): Payer: Medicare PPO | Admitting: Occupational Therapy

## 2019-11-27 LAB — GLUCOSE, CAPILLARY
Glucose-Capillary: 284 mg/dL — ABNORMAL HIGH (ref 70–99)
Glucose-Capillary: 221 mg/dL — ABNORMAL HIGH (ref 70–99)

## 2019-11-27 LAB — RENAL FUNCTION PANEL
Albumin: 2.2 g/dL — ABNORMAL LOW (ref 3.5–5.0)
Anion gap: 7 (ref 5–15)
BUN: 78 mg/dL — ABNORMAL HIGH (ref 8–23)
CO2: 26 mmol/L (ref 22–32)
Calcium: 8.8 mg/dL — ABNORMAL LOW (ref 8.9–10.3)
Chloride: 101 mmol/L (ref 98–111)
Creatinine, Ser: 4.27 mg/dL — ABNORMAL HIGH (ref 0.61–1.24)
GFR calc Af Amer: 15 mL/min — ABNORMAL LOW (ref >60)
GFR calc non Af Amer: 13 mL/min — ABNORMAL LOW (ref >60)
Glucose, Bld: 193 mg/dL — ABNORMAL HIGH (ref 70–99)
Phosphorus: 2.5 mg/dL (ref 2.5–4.6)
Potassium: 5.6 mmol/L — ABNORMAL HIGH (ref 3.5–5.1)
Sodium: 134 mmol/L — ABNORMAL LOW (ref 135–145)

## 2019-11-27 MED ORDER — INSULIN ASPART 100 UNIT/ML ~~LOC~~ SOLN
0.0000 [IU] | Freq: Three times a day (TID) | SUBCUTANEOUS | Status: DC
  Administered 2019-11-27: 3 [IU] via SUBCUTANEOUS
  Administered 2019-11-28: 2 [IU] via SUBCUTANEOUS
  Administered 2019-11-28 – 2019-11-30 (×3): 1 [IU] via SUBCUTANEOUS
  Administered 2019-11-30: 2 [IU] via SUBCUTANEOUS
  Administered 2019-12-01: 1 [IU] via SUBCUTANEOUS
  Administered 2019-12-01 – 2019-12-03 (×3): 2 [IU] via SUBCUTANEOUS
  Administered 2019-12-03: 1 [IU] via SUBCUTANEOUS

## 2019-11-27 MED ORDER — INSULIN ASPART 100 UNIT/ML ~~LOC~~ SOLN
0.0000 [IU] | Freq: Every day | SUBCUTANEOUS | Status: DC
  Administered 2019-11-27 – 2019-12-03 (×3): 2 [IU] via SUBCUTANEOUS

## 2019-11-27 MED ORDER — SODIUM ZIRCONIUM CYCLOSILICATE 5 G PO PACK
5.0000 g | PACK | Freq: Every day | ORAL | Status: DC
  Administered 2019-11-27 – 2019-11-28 (×2): 5 g via ORAL
  Filled 2019-11-27 (×2): qty 1

## 2019-11-27 NOTE — Progress Notes (Signed)
Occupational Therapy Session Note  Patient Details  Name: Dennis Macias MRN: [id number] Date of Birth: [birth year]  Today's Date: 11/27/2019 OT Individual Time: 1500-1530 OT Individual Time Calculation (min): 30 min    Short Term Goals: Week 1:  OT Short Term Goal 1 (Week 1): stg=ltg d/t elos  Skilled Therapeutic Interventions/Progress Updates:  Patient met lying supine in bed requiring increased coaxing/encouragement for participation with skilled OT treatment session. Supine to EOB with supervision A and sit to stand from EOB with Min A and use of BUE on bed surface. Functional mobility ~5-31f with close chair follow and use of quad cane. Patient noting increased pain in R knee with mobility. Patient very unsafe demonstrating increased impulsivity. Stand to sit in wc with Min A for controlled descent. Patient declined wc mobility to ortho gym stating "If we're going somewhere you better push me, otherwise I'm going back to bed!" OT provided total A for wc transport to and from ortho gym. Patient engaged in 8 min of BUE there ex with use of arm ergometer and rest breaks every 2 minutes on level 3. Session concluded with patient lying supine in bed with call bell within reach, bed alarm activated, and all needs met.   Therapy Documentation Precautions:  Precautions Precautions: Fall Precaution Comments: monitor for agitation Restrictions Weight Bearing Restrictions: No General:    Therapy/Group: Individual Therapy  Kafi Dotter R [place] 11/27/2019, 8:19 AM

## 2019-11-27 NOTE — Progress Notes (Addendum)
[place] PHYSICAL MEDICINE & REHABILITATION PROGRESS NOTE   Subjective/Complaints:  Pt reports doing well- said had BM yesterday- there is not one documented, but had 1 around 10am today.   Pt notes slept well and has no pain.   ROS:  Pt denies SOB, abd pain, CP, N/V/C/D, and vision changes  Objective:   No results found. Recent Labs    11/26/19 0946  WBC 5.4  HGB 8.5*  HCT 26.7*  PLT 200   Recent Labs    11/26/19 0946 11/27/19 0700  NA 134* 134*  K 5.4* 5.6*  CL 102 101  CO2 24 26  GLUCOSE 268* 193*  BUN 74* 78*  CREATININE 4.16* 4.27*  CALCIUM 8.7* 8.8*    Intake/Output Summary (Last 24 hours) at 11/27/2019 1244 Last data filed at 11/27/2019 0730 Gross per 24 hour  Intake 490 ml  Output 175 ml  Net 315 ml     Physical Exam: Vital Signs Blood pressure (!) 169/77, pulse 64, temperature 97.9 F (36.6 C), temperature source Oral, resp. rate 14, height 6' (1.829 m), weight 77.6 kg, SpO2 99 %.   Physical Exam  Nursing note and vitals reviewed. Constitutional: pt awake, sitting up in bed; watching sports on tv, NAD HENT: blind R eye- non reactive pupil  CV:  RRR Respiratory: CTA B/L GI: Soft, NT, ND, (+)BS  Musculoskeletal:     Comments: 1+ pitting edema right forearm.   Neurological: vague; slow to respond;   HOH- slowed processing-   He lacks insight/awareness of deficits.  Motor: 4+/5 throughout  Skin: Skin is warm and dry.  Stasis changes bilateral shins.   Psychiatric: flat affect   Assessment/Plan: 1. Functional deficits secondary to toxic metabolic encephalopathy which require 3+ hours per day of interdisciplinary therapy in a comprehensive inpatient rehab setting.  Physiatrist is providing close team supervision and 24 hour management of active medical problems listed below.  Physiatrist and rehab team continue to assess barriers to discharge/monitor patient progress toward functional and medical goals  Care Tool:  Bathing    Body  parts bathed by patient: Right arm, Left arm, Chest, Abdomen, Front perineal area, Buttocks, Right upper leg, Left upper leg, Right lower leg, Left lower leg, Face         Bathing assist Assist Level: Moderate Assistance - Patient 50 - 74%(power up sit to stand)     Upper Body Dressing/Undressing Upper body dressing Upper body dressing/undressing activity did not occur (including orthotics): N/A What is the patient wearing?: Pull over shirt    Upper body assist Assist Level: Supervision/Verbal cueing    Lower Body Dressing/Undressing Lower body dressing      What is the patient wearing?: Underwear/pull up     Lower body assist Assist for lower body dressing: Maximal Assistance - Patient 25 - 49%     Toileting Toileting    Toileting assist Assist for toileting: Total Assistance - Patient < 25%     Transfers Chair/bed transfer  Transfers assist  Chair/bed transfer activity did not occur: N/A  Chair/bed transfer assist level: Minimal Assistance - Patient > 75%     Locomotion Ambulation   Ambulation assist   Ambulation activity did not occur: N/A  Assist level: Minimal Assistance - Patient > 75% Assistive device: Hand held assist Max distance: 277ft   Walk 10 feet activity   Assist     Assist level: Minimal Assistance - Patient > 75% Assistive device: Hand held assist   Walk 50 feet activity  Assist    Assist level: Minimal Assistance - Patient > 75% Assistive device: Hand held assist    Walk 150 feet activity   Assist    Assist level: Minimal Assistance - Patient > 75% Assistive device: Hand held assist    Walk 10 feet on uneven surface  activity   Assist     Assist level: Minimal Assistance - Patient > 75% Assistive device: Hand held assist   Wheelchair     Assist Will patient use wheelchair at discharge?: No             Wheelchair 50 feet with 2 turns activity    Assist            Wheelchair 150 feet  activity     Assist          Blood pressure (!) 169/77, pulse 64, temperature 97.9 F (36.6 C), temperature source Oral, resp. rate 14, height 6' (1.829 m), weight 77.6 kg, SpO2 99 %.  Medical Problem List and Plan: 1.  Deficits with mobility, transfers, self-care secondary to debility with metabolic encephalopathy.             -patient may shower             -ELOS/Goals: 7-10 days/Supervision/Mod I             Admit to CIR 2.  Antithrombotics: -DVT/anticoagulation:  Pharmaceutical: Heparin             -antiplatelet therapy: DAPT 3. Pain Management: Tylenol prn.  4. Mood: LCSW to follow for evaluation and support.              -antipsychotic agents: N/A 5. Neuropsych: This patient is not fully capable of making decisions on his own behalf. 6. Skin/Wound Care: Routine pressure relief measures.  7. Fluids/Electrolytes/Nutrition: Monitor I/Os. Monitor for signs of overload- check daily weights.  Monitor renal status for recovery.    Filed Weights   11/25/19 1810 11/27/19 0503  Weight: 79.3 kg 77.6 kg   5/21- no weight for today, but yesterday weight was down 2 kg- will monitor  8. Cefipime induced encephalopathy: Improving 9. Acute on chronic renal failure: Followed by Dr. Hollie Salk. SCr-40.9 today.               CMP ordered for tomorrow.   5/20- Cr stable at 4.09- was 4.07- BUN worse at 66- was 54. Renal signed off- will recheck this weekend.   5/21- Cr came back late yesterday 4.16 and now 4.27- is still rising- if hits 4.5, will call renal back 10 HTN: Monitor BP tid--on Norvasc, Imdur, Hydralazine and Coreg.  5/20- BP well controlled- 120s/60s              Monitor with increased mobility 11. NSTEMI/ICM: Has significant LAD --to be treated medically with Coreg, Hydralazine, Imdur, ASA/Plavix and Lipitor. PCI if patient progresses to ESRD.              Daily weights 12. Pseudomonas UTI: Completed IV antibiotics on 05/08.  13. BPH s/p TURP 4/13: Continue Proscar. Condom cath in  place with cloudy urine in tubing. Will check PVRs to monitor voiding. Toilet every 4 hours during the day.    5/20- PVRs show very low post void residuals             Monitor for retention 14. Anemia of chronic disease: On Aranesp--increased to 151mcg/weekly today.              CBC  ordered for tomorrow  5/20- Hb 7.9- aranesp just increased due to dwindling Hb.  15. Hyperkalemia  5/21- will order Lokelma 5G daily since K+ rising - was 5.0, then 5.4, then now 5.6-    16. DM- A1c 8.5-   5/21- will start SSI and BG checks- will d/w pharmacy about meds considering rising renal function.   LOS: 2 days A FACE TO FACE EVALUATION WAS PERFORMED  Dennis Macias 11/27/2019, 12:44 PM

## 2019-11-27 NOTE — Evaluation (Signed)
Speech Language Pathology Assessment and Plan  Patient Details  Name: Dennis Macias MRN: [id number] Date of Birth: 07/14/1950  Today's Date: 11/27/2019 SLP Individual Time: 8144-8185 SLP Individual Time Calculation (min): 55 min   Problem List:  Patient Active Problem List   Diagnosis Date Noted  . Toxic metabolic encephalopathy 63/14/9702  . Anemia of chronic disease   . Benign essential HTN   . AKI (acute kidney injury) ([place])   . Protein-calorie malnutrition, severe 11/19/2019  . Acute ischemic left ICA stroke ([place])   . Bacteremia   . Hypertensive urgency   . Gait disorder   . Encephalopathies   . Chronic combined systolic and diastolic heart failure ([place])   . Non-STEMI (non-ST elevated myocardial infarction) ([place])   . Syncope 11/06/2019  . Acute combined systolic and diastolic congestive heart failure ([place]) 10/21/2019  . Ischemic cardiomyopathy: Probable 10/21/2019  . Pleural effusion transudative   . S/P TURP   . Cardiorenal syndrome   . Dyspnea 10/15/2019  . Elevated TSH 09/29/2019  . Pleural effusion 09/21/2019  . Fatigue 09/17/2019  . Hematuria, unspecified 08/13/2019  . Nephrotic syndrome 08/05/2019  . Protein calorie malnutrition ([place]) 07/31/2019  . Left renal mass 07/21/2019  . CKD (chronic kidney disease) stage 4, GFR 15-29 ml/min (H[facility]) 04/23/2019  . Hip weakness 08/13/2018  . Pernicious anemia 02/24/2018  . Weight loss, non-intentional 08/26/2017  . 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 Syncope and collapse 02/26/2017  . BPH with urinary obstruction   . Normocytic anemia 12/21/2016  . 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 History of stroke 09/13/2015  . Hyperlipidemia 09/13/2015  . Diabetes mellitus with retinopathy of both eyes ([place])   . Essential hypertension    Past Medical History:  Past Medical History:  Diagnosis Date  . Anemia   . Arthritis    past hx   . Blindness    right eye  . Cataract    removed both eyes  .  Dehydration   . Diabetes ([place])   . Glaucoma   . History of CVA (cerebrovascular accident) 09/13/2015  . History of urinary retention   . Hyperlipidemia   . Hypertension   . Pernicious anemia 02/24/2018  . Stroke [facility])    2017- March  . Syncope 11/2019  . Tachycardia 08/26/2017  . Tubular adenoma of colon 02/2017  . Weight loss, non-intentional 08/26/2017   10 lbs between 6/18 & 2/19   Past Surgical History:  Past Surgical History:  Procedure Laterality Date  . CATARACT EXTRACTION, BILATERAL    . COLONOSCOPY    . IR THORACENTESIS ASP PLEURAL SPACE W/IMG GUIDE  09/21/2019  . IR THORACENTESIS ASP PLEURAL SPACE W/IMG GUIDE  10/16/2019  . LEFT HEART CATH AND CORONARY ANGIOGRAPHY N/A 11/19/2019   Procedure: LEFT HEART CATH AND CORONARY ANGIOGRAPHY;  Surgeon: Jettie Booze, MD;  Location: [facility];  Service: Cardiovascular;  Laterality: N/A;  . POLYPECTOMY    . REFRACTIVE SURGERY  10/2017  . TEE WITHOUT CARDIOVERSION N/A 09/14/2015   Procedure: TRANSESOPHAGEAL ECHOCARDIOGRAM (TEE);  Surgeon: Larey Dresser, MD;  Location: [facility];  Service: Cardiovascular;  Laterality: N/A;  . TRANSURETHRAL RESECTION OF PROSTATE N/A 10/20/2019   Procedure: TRANSURETHRAL RESECTION OF THE PROSTATE (TURP);  Surgeon: Irine Seal, MD;  Location: [facility];  Service: Urology;  Laterality: N/A;  . UPPER GASTROINTESTINAL ENDOSCOPY      Assessment / Plan / Recommendation Clinical Impression   Dennis Macias is a 69 year old RH  male with history of HTN, T2DM, BPH-s/p TURP with Foley placement, prior CVA, CKD- baseline SCR 3.3-3.7,  recent admission for anasarca s/p thoracocentesis with IV diuresis; who was admitted on 11/06/2019 due to syncope.  He was found to have elevated troponin due to NSTEMI as well as frequent PVCs with NSVT.  UDS negative.  He developed fevers with chills past admission and was started on IV cefepime for bacteremia due to Pseudomonas UTI.  He had progressive lethargy with confusion and  on 11/11/19 he was noted to have BUE tremors due to metabolic encephalopathy.  He was found to have global aphasia on 05/06 and MRI/MRA brain done revealing 10 mm left parietal lobe subcortical infarct, chronic left thalamic infarct and advanced small vessel disease.  EEG was suggestive of moderate diffuse nonspecific encephalopathy.  Neurology following for input and felt that a MS and speech issues not due to stroke and that encephalopathy was related to cefepime and antibiotics changed to [place]. Stroke was felt to be an incidental finding due to small vessel disease but due to recurrent stroke and history of cardiomyopathy--Dr. Leonie Man recommended 30-day cardiac event monitor to evaluate for PAF.  Cardiology expressed concerns that ventricular tachycardia in setting of cardiomyopathy could be cause of syncope but cath was placed on hold with recommendations of medical treatment for NSTEMI.  Mentation is improving and agitation has resolved.  Verbal output has improved and is tolerating regular textures. Therapy ongoing and patient is showing improvement in participation. CIR recommended due to functional decline.  Please see preadmission assessment from earlier today as well.  SLP evaluation was completed on 11/27/19 with results as follows: Pt scored 23/30 on the MoCA-Basic which is below normal cut off range.  Deficits were most notable on attention and executive functioning subtests; however, these were visually based tests and chart review indicates that pt has decreased visual acuity and right eye blindness which could have contributed to task performance.  Both pt and pt's son, who was present for the duration of today's evaluation report that pt is back to baseline for speech, language, and cognition.  Pt denies any difficulty with expressing his needs or wants to caregivers and family members.  Pt reports that at baseline he watched TV and played with his grandkids.  His girlfriend managed his medications  and helped with managing his finances as well.  Pt reports that he does not drive or work anymore and he will have 24/7 supervision at discharge from multiple family members.  As a result, I do not recommend ST services at this time.  I did provide skilled education regarding compensatory memory strategies to maximize functional independence in the home environment.  I also educated pt on the availability of [place] or OP ST services should he have any difficulty transitioning back home that required ST intervention at a different level of care.  All questions were answered to pt's and family's satisfaction at this time.    Skilled Therapeutic Interventions          Cognitive-linguistic evaluation completed with results and recommendations reviewed with patient and family.     SLP Assessment  Patient does not need any further Speech Lanaguage Pathology Services    Recommendations  Follow up Recommendations: None Equipment Recommended: None recommended by SLP           Pain Pain Assessment Pain Scale: 0-10 Pain Score: 0-No pain  Prior Functioning Type of Home: Apartment  Lives With: Significant other Available Help at Discharge: Family  Education: 12th grade Vocation: Unemployed  SLP Evaluation Cognition Overall Cognitive Status: History of cognitive impairments - at baseline Arousal/Alertness: Awake/alert Orientation Level: Oriented X4 Attention: Sustained Sustained Attention: Appears intact Memory: Appears intact Awareness: Appears intact Problem Solving: Appears intact Safety/Judgment: Appears intact  Comprehension Auditory Comprehension Overall Auditory Comprehension: Appears within functional limits for tasks assessed Expression Expression Primary Mode of Expression: Verbal Verbal Expression Overall Verbal Expression: Appears within functional limits for tasks assessed Oral Motor Oral Motor/Sensory Function Overall Oral Motor/Sensory Function: Within functional  limits Motor Speech Overall Motor Speech: Appears within functional limits for tasks assessed    Refer to Care Plan for Long Term Goals  Recommendations for other services: None   Discharge Criteria: Patient will be discharged from SLP if patient refuses treatment 3 consecutive times without medical reason, if treatment goals not met, if there is a change in medical status, if patient makes no progress towards goals or if patient is discharged from hospital.  The above assessment, treatment plan, treatment alternatives and goals were discussed and mutually agreed upon: by patient and by family  , Selinda Orion 11/27/2019, 11:30 AM

## 2019-11-27 NOTE — Progress Notes (Signed)
Physical Therapy Session Note  Patient Details  Name: Dennis Macias MRN: [id number] Date of Birth: February 15, 1951  Today's Date: 11/27/2019 PT Individual Time: 0807-0902 PT Individual Time Calculation (min): 55 min   Short Term Goals: Week 1:  PT Short Term Goal 1 (Week 1): = to LTGs based on ELOS  Skilled Therapeutic Interventions/Progress Updates:     Pt received supine in bed and agreeable to therapy. No report of pain initially. Supine>sit from elevated [facility] with supervision for safety. Sit>stand minA with facilitation at hips. Pt ambulates 100' with quad cane in RUE and minA from PT at hips and trunk. Pt does not have any overt LOBs but appears generally unsteady with ambulation. While ambulating pt describes increasing pain in R knee. PT provides rest break as needed to manage pain. Pt is not tender to palpation around R knee or along joint line but pain described with weight bearing.  Pt performs supine therex on mat table. Alternating isometric hip flexion with contralateral UE extension pressing into knee for hip strengthening and TA activation. 3x10.  Pt performs SLR with 4lb ankle weight. PT providing minimal manual assistance to facilitate terminal knee extension and prevent extensor lag. Pt performs 3x10 with BLEs.  MinA for sit to stand and pt ambulates 100' with RW (to limit knee pain) back to room with CGA for safety. Sit>supine with minA and pt left supine in bed with alarm intact and all needs within reach.  Therapy Documentation Precautions:  Precautions Precautions: Fall Precaution Comments: monitor for agitation Restrictions Weight Bearing Restrictions: No    Therapy/Group: Individual Therapy  Breck Coons, PT, DPT 11/27/2019, 4:01 PM

## 2019-11-27 NOTE — Progress Notes (Signed)
Occupational Therapy Session Note  Patient Details  Name: Dennis Macias MRN: [id number] Date of Birth: 12/06/1950  Today's Date: 11/27/2019 OT Individual Time: 1257-1355 OT Individual Time Calculation (min): 58 min   Short Term Goals: Week 1:  OT Short Term Goal 1 (Week 1): stg=ltg d/t elos  Skilled Therapeutic Interventions/Progress Updates:    Pt greeted in bed with no c/o pain at rest. He did not want to shower or try to use the bathroom. Agreeable to go outside for tx. Mod A and vcs for supine<sit from flat bed without bedrail use per setup at home. Min A for stand pivot<w/c using RW with vcs for safe device placement prior to sitting. Pt required cuing at the sink to engage in handwashing and oral care using rinse afterwards. He was then escorted to the outdoor patio. Worked on higher level balance by ambulating over uneven surfaces and up/down small inclines while using RW. He required vcs for walker placement when transferring to the community bench and also back to his w/c. Min-Mod A for power ups with vcs for technique and hand placement. Transitioned to UB strengthening focus with pt self propelling w/c in the gift shop. Min A for obstacle avoidance predominantly on his Lt side. Pt wanted to try on eyeglasses and needed assistance with motor planning to do this himself. He was then escorted back to the room via w/c. Short distance ambulatory transfer completed using quad cane with Min-Mod A, note 1 small LOB before transferring onto the bed due to impulsivity with transfer. He returned to flat bed unassisted and doffed both gripper socks using reclined figure 4 position. Left pt with all needs within reach, bed alarm set, in care of NT for bladder scan. Tx focus placed on dynamic standing balance, safe device mgt, community mobility, and praxis.   Therapy Documentation Precautions:  Precautions Precautions: Fall Precaution Comments: monitor for agitation Restrictions Weight Bearing  Restrictions: No Vital Signs: Therapy Vitals Temp: 98 F (36.7 C) Pulse Rate: 66 Resp: 16 BP: 119/63 Patient Position (if appropriate): Lying Oxygen Therapy SpO2: 100 % O2 Device: Room Air Pain: "some" pain his knee when ambulating today. Pt reports at rest that it "isn't bad" and refused interventions to address.    ADL: ADL Upper Body Bathing: Setup Where Assessed-Upper Body Bathing: Shower Lower Body Bathing: Moderate assistance Where Assessed-Lower Body Bathing: Shower Upper Body Dressing: Supervision/safety Where Assessed-Upper Body Dressing: Sitting at sink, Wheelchair Lower Body Dressing: Maximal assistance Where Assessed-Lower Body Dressing: Sitting at sink, Medical laboratory scientific officer: Minimal assistance Tub/Shower Transfer: Minimal assistance Tub/Shower Transfer Method: Stand pivot Tub/Shower Equipment: Transfer tub bench      Therapy/Group: Individual Therapy  Hether Anselmo A Talani Brazee 11/27/2019, 3:44 PM

## 2019-11-27 NOTE — Plan of Care (Signed)
  Problem: RH BOWEL ELIMINATION Goal: RH STG MANAGE BOWEL WITH ASSISTANCE Description: STG Manage Bowel with min assist   Outcome: Progressing Goal: RH STG MANAGE BOWEL W/MEDICATION W/ASSISTANCE Description: STG Manage Bowel with Medication with min Assistance. Outcome: Progressing   Problem: RH BLADDER ELIMINATION Goal: RH STG MANAGE BLADDER WITH ASSISTANCE Description: STG Manage Bladder With min assist   Outcome: Progressing   Problem: RH SAFETY Goal: RH STG ADHERE TO SAFETY PRECAUTIONS W/ASSISTANCE/DEVICE Description: STG Adhere to Safety Precautions With supervision   Outcome: Progressing   Problem: RH COGNITION-NURSING Goal: RH STG USES MEMORY AIDS/STRATEGIES W/ASSIST TO PROBLEM SOLVE Description: STG Uses Memory Aids/Strategies With supervision to Problem Solve. Outcome: Progressing   Problem: Consults Goal: RH STROKE PATIENT EDUCATION Description: See Patient Education module for education specifics  Outcome: Progressing

## 2019-11-28 ENCOUNTER — Inpatient Hospital Stay (HOSPITAL_COMMUNITY): Payer: Medicare PPO | Admitting: Physical Therapy

## 2019-11-28 ENCOUNTER — Inpatient Hospital Stay (HOSPITAL_COMMUNITY): Payer: Medicare PPO | Admitting: Occupational Therapy

## 2019-11-28 DIAGNOSIS — R532 Functional quadriplegia: Secondary | ICD-10-CM

## 2019-11-28 LAB — GLUCOSE, CAPILLARY
Glucose-Capillary: 141 mg/dL — ABNORMAL HIGH (ref 70–99)
Glucose-Capillary: 210 mg/dL — ABNORMAL HIGH (ref 70–99)
Glucose-Capillary: 154 mg/dL — ABNORMAL HIGH (ref 70–99)
Glucose-Capillary: 101 mg/dL — ABNORMAL HIGH (ref 70–99)

## 2019-11-28 LAB — RENAL FUNCTION PANEL
Albumin: 2.2 g/dL — ABNORMAL LOW (ref 3.5–5.0)
Anion gap: 8 (ref 5–15)
BUN: 84 mg/dL — ABNORMAL HIGH (ref 8–23)
CO2: 25 mmol/L (ref 22–32)
Calcium: 8.9 mg/dL (ref 8.9–10.3)
Chloride: 101 mmol/L (ref 98–111)
Creatinine, Ser: 4.39 mg/dL — ABNORMAL HIGH (ref 0.61–1.24)
GFR calc Af Amer: 15 mL/min — ABNORMAL LOW (ref >60)
GFR calc non Af Amer: 13 mL/min — ABNORMAL LOW (ref >60)
Glucose, Bld: 179 mg/dL — ABNORMAL HIGH (ref 70–99)
Phosphorus: 2.9 mg/dL (ref 2.5–4.6)
Potassium: 5.9 mmol/L — ABNORMAL HIGH (ref 3.5–5.1)
Sodium: 134 mmol/L — ABNORMAL LOW (ref 135–145)

## 2019-11-28 MED ORDER — SODIUM POLYSTYRENE SULFONATE 15 GM/60ML PO SUSP
30.0000 g | Freq: Once | ORAL | Status: AC
  Administered 2019-11-28: 30 g via ORAL
  Filled 2019-11-28: qty 120

## 2019-11-28 MED ORDER — SODIUM ZIRCONIUM CYCLOSILICATE 10 G PO PACK
10.0000 g | PACK | Freq: Every day | ORAL | Status: DC
  Administered 2019-11-29 – 2019-11-30 (×2): 10 g via ORAL
  Filled 2019-11-28 (×2): qty 1

## 2019-11-28 NOTE — Progress Notes (Signed)
Physical Therapy Session Note  Patient Details  Name: Dennis Macias MRN: [id number] Date of Birth: [birth year]  Today's Date: 11/28/2019 PT Individual Time: 7703-4035 PT Individual Time Calculation (min): 42 min   Short Term Goals: Week 1:  PT Short Term Goal 1 (Week 1): = to LTGs based on ELOS  Skilled Therapeutic Interventions/Progress Updates: Pt presented in bed agreeable to therapy. Pt denies pain at start of session. Performed bed mobility supine to sit with HOB elevated and CGA with increased time. PTA threaded pants and donned shoes for time management. Performed STS with minA from lowered bed and required minA for completion of LB clothing management. Performed stand pivot to w/c no AD with CGA. Pt transported to rehab gym for energy conservation. Performed stand pivot with SBQC CGA to mat. Participated in STS no AD x 5 for BLE strengthening and static balance. Participated in toe taps to dots for dynamic balance and wt shifting with cues for increased step length as pt noted to shift closer to dots with repetition. Pt then participated in toe taps to 2in step no AD 2 x 10. Progression with improved wt shifting and no LOB. Participated in gait training initially with SBQC x 26f with minA and moderate instability. Pt with x 1 occurrence of L knee buckling while performing turn to mat. Pt then ambulated 542fwith RW with significant improvement to stability. After seated rest pt ambulated back to room with RW and CGA. Pt returned to bed with supervision and pt left in bed with alarm on, call bell within reach and needs met.      Therapy Documentation Precautions:  Precautions Precautions: Fall Precaution Comments: monitor for agitation Restrictions Weight Bearing Restrictions: No    Therapy/Group: Individual Therapy  Hanin [place]ook  Lalania Haseman, PTA  11/28/2019, 12:44 PM

## 2019-11-28 NOTE — Progress Notes (Signed)
Physical Therapy Session Note  Patient Details  Name: Mckennon Zwart MRN: [id number] Date of Birth: 26-May-1951  Today's Date: 11/28/2019 PT Individual Time: 1324-4010 PT Individual Time Calculation (min): 53 min   Short Term Goals: Week 1:  PT Short Term Goal 1 (Week 1): = to LTGs based on ELOS  Skilled Therapeutic Interventions/Progress Updates:   Pt received supine in bed and agreeable to PT. Supine>sit transfer with superivsion assist and cues for safety. PT assisted pt to don shoes. Stand pivot transfer to [facility] with min assist to power into standing. Sit<>stand throughout treatment with min-mod assist from PT, requiring increased assist with fatigue. Pt performed dynamic gait training to weave through 6 cones with min assist and min cues for RW management in turns. Pt then performed dynamic gait training over unlevel cement sidewalk with RW and min assist x 198f. Pt noted to have mild ataxia and antalgia with gait on the LLE. Pt declined additional gait at this time. PT instructed pt in modified otago level A balanc exercises, HS culr, hip abduction, mini squat x 8 BLE each, and  semi-tandem stance 2 x 10 sec each LE. Pt returned to room and performed stand pivot transfer with RW to bed. Sit>supine completed with supervision assist, and left supine in bed with call bell in reach and all needs met.        Therapy Documentation Precautions:  Precautions Precautions: Fall Precaution Comments: monitor for agitation Restrictions Weight Bearing Restrictions: No Vital Signs: Therapy Vitals Temp: 98.9 F (37.2 C) Pulse Rate: 71 Resp: 18 BP: 124/61 Patient Position (if appropriate): Lying Oxygen Therapy SpO2: 98 % O2 Device: Room Air Pain: Denies at rest. Discomfort LLE with gait. Pt repositioned.    Therapy/Group: Individual Therapy  ALorie Phenix5/22/2021, 4:32 PM

## 2019-11-28 NOTE — Progress Notes (Signed)
[place] PHYSICAL MEDICINE & REHABILITATION PROGRESS NOTE   Subjective/Complaints:  Pt reports no issues- nursing reports he has 2 IVs- can they remove one.  Of note, pt's K+ has increased to 5.9 (after adding Lokelma) so will increase Lokelma dose and give Kayexylate to get him to get rid of K+.    ROS:  Pt denies SOB, abd pain, CP, N/V/C/D, and vision changes  Objective:   No results found. Recent Labs    11/26/19 0946  WBC 5.4  HGB 8.5*  HCT 26.7*  PLT 200   Recent Labs    11/27/19 0700 11/28/19 0758  NA 134* 134*  K 5.6* 5.9*  CL 101 101  CO2 26 25  GLUCOSE 193* 179*  BUN 78* 84*  CREATININE 4.27* 4.39*  CALCIUM 8.8* 8.9    Intake/Output Summary (Last 24 hours) at 11/28/2019 1421 Last data filed at 11/28/2019 1100 Gross per 24 hour  Intake 837 ml  Output 575 ml  Net 262 ml     Physical Exam: Vital Signs Blood pressure (!) 147/67, pulse 64, temperature 98.2 F (36.8 C), temperature source Oral, resp. rate 16, height 6' (1.829 m), weight 79.7 kg, SpO2 100 %.   Physical Exam  Nursing note and vitals reviewed. 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   Neurological: still slowed processing-    HOH- slowed processing-   He lacks insight/awareness of deficits.  Motor: 4+/5 throughout  Skin: Skin is warm and dry. IVs in both arms Stasis changes bilateral shins.   Psychiatric: flat affect  Assessment/Plan: 1. Functional deficits secondary to toxic metabolic encephalopathy which require 3+ hours per day of interdisciplinary therapy in a comprehensive inpatient rehab setting.  Physiatrist is providing close team supervision and 24 hour management of active medical problems listed below.  Physiatrist and rehab team continue to assess  barriers to discharge/monitor patient progress toward functional and medical goals  Care Tool:  Bathing    Body parts bathed by patient: Right arm, Left arm, Chest, Abdomen, Front perineal area, Buttocks, Right upper leg, Left upper leg, Right lower leg, Left lower leg, Face         Bathing assist Assist Level: Moderate Assistance - Patient 50 - 74%(power up sit to stand)     Upper Body Dressing/Undressing Upper body dressing Upper body dressing/undressing activity did not occur (including orthotics): N/A What is the patient wearing?: Pull over shirt    Upper body assist Assist Level: Supervision/Verbal cueing    Lower Body Dressing/Undressing Lower body dressing      What is the patient wearing?: Incontinence brief, Hospital gown only     Lower body assist Assist for lower body dressing: Maximal Assistance - Patient 25 - 49%     Toileting Toileting    Toileting assist Assist for toileting: Total Assistance - Patient < 25%     Transfers Chair/bed transfer  Transfers assist  Chair/bed transfer activity did not occur: N/A  Chair/bed transfer assist level: Minimal Assistance - Patient > 75%     Locomotion Ambulation   Ambulation assist   Ambulation activity did not occur: N/A  Assist level: Minimal Assistance - Patient > 75% Assistive device: Cane-quad Max distance: 100'   Walk 10 feet activity   Assist  Assist level: Minimal Assistance - Patient > 75% Assistive device: Cane-quad   Walk 50 feet activity   Assist    Assist level: Minimal Assistance - Patient > 75% Assistive device: Cane-quad    Walk 150 feet activity   Assist    Assist level: Minimal Assistance - Patient > 75% Assistive device: Hand held assist    Walk 10 feet on uneven surface  activity   Assist     Assist level: Minimal Assistance - Patient > 75% Assistive device: Hand held assist   Wheelchair     Assist Will patient use wheelchair at discharge?: No              Wheelchair 50 feet with 2 turns activity    Assist            Wheelchair 150 feet activity     Assist          Blood pressure (!) 147/67, pulse 64, temperature 98.2 F (36.8 C), temperature source Oral, resp. rate 16, height 6' (1.829 m), weight 79.7 kg, SpO2 100 %.  Medical Problem List and Plan: 1.  Deficits with mobility, transfers, self-care secondary to debility with metabolic encephalopathy.             -patient may shower             -ELOS/Goals: 7-10 days/Supervision/Mod I             Admit to CIR 2.  Antithrombotics: -DVT/anticoagulation:  Pharmaceutical: Heparin             -antiplatelet therapy: DAPT 3. Pain Management: Tylenol prn.  4. Mood: LCSW to follow for evaluation and support.              -antipsychotic agents: N/A 5. Neuropsych: This patient is not fully capable of making decisions on his own behalf. 6. Skin/Wound Care: Routine pressure relief measures.  7. Fluids/Electrolytes/Nutrition: Monitor I/Os. Monitor for signs of overload- check daily weights.  Monitor renal status for recovery.    Filed Weights   11/25/19 1810 11/27/19 0503 11/28/19 0443  Weight: 79.3 kg 77.6 kg 79.7 kg   5/21- no weight for today, but yesterday weight was down 2 kg- will monitor  5/22- Weight back to 79 kg- will monitor to make sure not retaining a lot of fluid.   8. Cefipime induced encephalopathy: Improving 9. Acute on chronic renal failure: Followed by Dr. Hollie Salk. SCr-40.9 today.               CMP ordered for tomorrow.   5/20- Cr stable at 4.09- was 4.07- BUN worse at 66- was 54. Renal signed off- will recheck this weekend.   5/21- Cr came back late yesterday 4.16 and now 4.27- is still rising- if hits 4.5, will call renal back  5/21- Cr up to 4.39- still rising- if increasing to 4.5- will call renal back to see if can help. Also hyperkalemic.  10 HTN: Monitor BP tid--on Norvasc, Imdur, Hydralazine and Coreg.  5/20- BP well controlled- 120s/60s               Monitor with increased mobility 11. NSTEMI/ICM: Has significant LAD --to be treated medically with Coreg, Hydralazine, Imdur, ASA/Plavix and Lipitor. PCI if patient progresses to ESRD.            5/22- monitoring for Sx's 12. Pseudomonas UTI: Completed IV antibiotics on 05/08.  13. BPH s/p TURP 4/13: Continue Proscar. Condom cath in place with cloudy urine in  tubing. Will check PVRs to monitor voiding. Toilet every 4 hours during the day.    5/20- PVRs show very low post void residuals             Monitor for retention 14. Anemia of chronic disease: On Aranesp--increased to 172mcg/weekly today.              CBC ordered for tomorrow  5/20- Hb 7.9- aranesp just increased due to dwindling Hb.  15. Hyperkalemia  5/21- will order Lokelma 5G daily since K+ rising - was 5.0, then 5.4, then now 5.6-   5/22-will Increase lokelma to 10g and added a dose of Kayexylate to get it down faster   16. DM- A1c 8.5-   5/21- will start SSI and BG checks- will d/w pharmacy about meds considering rising renal function.   CBG (last 3)  Recent Labs    11/27/19 2121 11/28/19 0617 11/28/19 1119  GLUCAP 221* 141* 210*    5/22- BGs vAriable- but cannot add Oral agent due to rising Cr- will monitor for another day and add insulin dosing based on BGs.    LOS: 3 days A FACE TO FACE EVALUATION WAS PERFORMED  Dennis Macias 11/28/2019, 2:21 PM

## 2019-11-28 NOTE — IPOC Note (Addendum)
Overall Plan of Care [facility]) Patient Details Name: Dennis Macias MRN: [id number] DOB: 1950-09-05  Admitting Diagnosis: Toxic metabolic encephalopathy  Hospital Problems: Principal Problem:   Toxic metabolic encephalopathy     Functional Problem List: Nursing Bladder, Bowel, Safety  PT Balance, Safety, Behavior, Sensory, Edema, Skin Integrity, Endurance, Motor, Nutrition, Pain, Perception  OT Balance, Endurance, Motor, Safety, Vision  SLP    TR         Basic ADL's: OT Grooming, Bathing, Dressing, Toileting     Advanced  ADL's: OT       Transfers: PT Bed Mobility, Bed to Chair, Car, Furniture, Floor  OT Toilet, Metallurgist: PT Ambulation, Stairs     Additional Impairments: OT Fuctional Use of Upper Extremity  SLP        TR      Anticipated Outcomes Item Anticipated Outcome  Self Feeding S  Swallowing      Basic self-care  S  Toileting  S   Bathroom Transfers S  Bowel/Bladder  Patient will safely be able to use bowle and bladder  Transfers  supervision  Locomotion  supervision  Communication     Cognition     Pain  Pain will be less than 4  Safety/Judgment  Patient will remain safe throughout stay   Therapy Plan: PT Intensity: Minimum of 1-2 x/day ,45 to 90 minutes PT Frequency: 5 out of 7 days PT Duration Estimated Length of Stay: ~7 days OT Intensity: Minimum of 1-2 x/day, 45 to 90 minutes OT Frequency: 5 out of 7 days OT Duration/Estimated Length of Stay: 6-9     Due to the current state of emergency, patients may not be receiving their 3-hours of Medicare-mandated therapy.   Team Interventions: Nursing Interventions Bladder Management, Bowel Management, Medication Management, Psychosocial Support  PT interventions Ambulation/gait training, Community reintegration, DME/adaptive equipment instruction, Neuromuscular re-education, Psychosocial support, Stair training, UE/LE Strength taining/ROM, Training and development officer,  Discharge planning, Pain management, Functional electrical stimulation, Skin care/wound management, Therapeutic Activities, UE/LE Coordination activities, Cognitive remediation/compensation, Disease management/prevention, Functional mobility training, Patient/family education, Splinting/orthotics, Visual/perceptual remediation/compensation, Therapeutic Exercise  OT Interventions Balance/vestibular training, Discharge planning, Pain management, Self Care/advanced ADL retraining, Therapeutic Activities, UE/LE Coordination activities, Visual/perceptual remediation/compensation, Therapeutic Exercise, Skin care/wound managment, Patient/family education, Functional mobility training, Disease mangement/prevention, Cognitive remediation/compensation, Community reintegration, Engineer, drilling, Neuromuscular re-education, Psychosocial support, Splinting/orthotics, UE/LE Strength taining/ROM, Wheelchair propulsion/positioning  SLP Interventions    TR Interventions    SW/CM Interventions Discharge Planning, Psychosocial Support, Patient/Family Education   Barriers to Discharge MD  Medical stability, Home enviroment access/loayout, Lack of/limited family support, Behavior and Rising Cr as well as Hyperkalemia-   Nursing Incontinence, Behavior    PT Inaccessible home environment, Decreased caregiver support, Home environment access/layout, Medical stability    OT Decreased caregiver support    SLP      SW       Team Discharge Planning: Destination: PT-Home ,OT- Home , SLP-  Projected Follow-up: PT-Home health PT, 24 hour supervision/assistance, OT-  Home health OT, SLP-None Projected Equipment Needs: PT-To be determined, OT- 3 in 1 bedside comode, SLP-None recommended by SLP Equipment Details: PT-has quad-cane and RW, OT-  Patient/family involved in discharge planning: PT- Patient,  OT-Patient, SLP-Patient, Family member/caregiver  MD ELOS: 6-9 days Medical Rehab Prognosis:   Fair Assessment: Pt is a 69 yr old male with metabolic encephalopathy, DM with A1c of 8.6, acute on chronic renal failure with rising Cr- hasn't stopped- will call renal back if risen  again tomorrow; s/p Nstemi- also has Anemia of chronic disease and hyperkalemia with K+ of 5.9- changing medical treatment to compensate.   Main issue is cognitive issues  Goals supervision- cannot reach mod I base don cognitive issues.    See Team Conference Notes for weekly updates to the plan of care

## 2019-11-28 NOTE — Progress Notes (Signed)
Occupational Therapy Session Note  Patient Details  Name: Dennis Macias MRN: [id number] Date of Birth: [birth year]  Today's Date: 11/28/2019 OT Individual Time: 1035-1150 OT Individual Time Calculation (min): 75 min    Short Term Goals: Week 1:  OT Short Term Goal 1 (Week 1): stg=ltg d/t elos  Skilled Therapeutic Interventions/Progress Updates: focus on session:  Patient repeated asked for statements and questions to be repeated.  Supine to edge of bed= close S and movements slow Edge of bed to w/c transfers= close S and extra time  UB bathing and dressing in w/c at sink=setup and cues for topographical orientation due to complex visually stimulating patterns in his shirt, as well as difficultry finding sleeves  LB bathing sit to stand= extra time and CGA  LB dressing = extra time and min assist to don socks Patient asked to ly back down after session and required close S for w/c to bed transfer Call bell and phone within reach  Continue OT plan of care     Therapy Documentation Precautions:  Precautions Precautions: Fall Precaution Comments: monitor for agitation Restrictions Weight Bearing Restrictions: No General: General OT Amount of Missed Time: 10 Minutes Vital Signs:  Pain:denied   ADL:  Vision   Perception    Praxis   Exercises:   Other Treatments:     Therapy/Group: Individual Therapy  Alfredia Ferguson [facility] 11/28/2019, 3:22 PM

## 2019-11-29 ENCOUNTER — Inpatient Hospital Stay (HOSPITAL_COMMUNITY): Payer: Medicare PPO

## 2019-11-29 ENCOUNTER — Inpatient Hospital Stay (HOSPITAL_COMMUNITY): Payer: Medicare PPO | Admitting: Physical Therapy

## 2019-11-29 ENCOUNTER — Encounter (HOSPITAL_COMMUNITY): Payer: Self-pay | Admitting: Physical Medicine and Rehabilitation

## 2019-11-29 LAB — RENAL FUNCTION PANEL
Albumin: 2.1 g/dL — ABNORMAL LOW (ref 3.5–5.0)
Anion gap: 7 (ref 5–15)
BUN: 87 mg/dL — ABNORMAL HIGH (ref 8–23)
CO2: 27 mmol/L (ref 22–32)
Calcium: 8.6 mg/dL — ABNORMAL LOW (ref 8.9–10.3)
Chloride: 102 mmol/L (ref 98–111)
Creatinine, Ser: 4.5 mg/dL — ABNORMAL HIGH (ref 0.61–1.24)
GFR calc Af Amer: 14 mL/min — ABNORMAL LOW (ref >60)
GFR calc non Af Amer: 12 mL/min — ABNORMAL LOW (ref >60)
Glucose, Bld: 125 mg/dL — ABNORMAL HIGH (ref 70–99)
Phosphorus: 3.2 mg/dL (ref 2.5–4.6)
Potassium: 5.6 mmol/L — ABNORMAL HIGH (ref 3.5–5.1)
Sodium: 136 mmol/L (ref 135–145)

## 2019-11-29 LAB — SODIUM, URINE, RANDOM: Sodium, Ur: 58 mmol/L

## 2019-11-29 LAB — GLUCOSE, CAPILLARY
Glucose-Capillary: 109 mg/dL — ABNORMAL HIGH (ref 70–99)
Glucose-Capillary: 183 mg/dL — ABNORMAL HIGH (ref 70–99)
Glucose-Capillary: 150 mg/dL — ABNORMAL HIGH (ref 70–99)
Glucose-Capillary: 237 mg/dL — ABNORMAL HIGH (ref 70–99)

## 2019-11-29 LAB — CREATININE, URINE, RANDOM: Creatinine, Urine: 69.99 mg/dL

## 2019-11-29 MED ORDER — HYDROCERIN EX CREA
TOPICAL_CREAM | Freq: Two times a day (BID) | CUTANEOUS | Status: DC
  Administered 2019-12-03: 1 via TOPICAL
  Filled 2019-11-29: qty 113

## 2019-11-29 MED ORDER — FUROSEMIDE 10 MG/ML IJ SOLN
80.0000 mg | Freq: Two times a day (BID) | INTRAMUSCULAR | Status: DC
  Administered 2019-11-29 – 2019-12-01 (×4): 80 mg via INTRAVENOUS
  Filled 2019-11-29 (×4): qty 8

## 2019-11-29 NOTE — Progress Notes (Signed)
Physical Therapy Session Note  Patient Details  Name: Dennis Macias MRN: [id number] Date of Birth: December 20, 1950  Today's Date: 11/29/2019 PT Individual Time: 6435-3912 PT Individual Time Calculation (min): 40 min   Short Term Goals: Week 1:  PT Short Term Goal 1 (Week 1): = to LTGs based on ELOS  Skilled Therapeutic Interventions/Progress Updates:  Pt was seen bedside in the pm. Pt transferred supine to edge of bed with S and siderail. Pt transferred sit to stand with min A and rolling walker. Pt ambulated 175 feet with rolling walker and c/g with verbal cues. Pt performed multiple sit to stand transfers with rolling walker and min A with verbal cues. Pt performed alternating step taps 3 sets x 10 reps each. Pt ambulated 25 feet with rolling walker and c/g. Pt ascended/descended 8 stairs with B rails and min A with verbal cues. Pt ambulated back to room about 175 feet with rolling walker and c/g with verbal cues. Pt transferred edge of bed to supine with side rail and S. Pt left sitting up in bed with bed alarm on and all needs within reach.   Therapy Documentation Precautions:  Precautions Precautions: Fall Precaution Comments: monitor for agitation Restrictions Weight Bearing Restrictions: No General:   Pain: No c/o pain   Therapy/Group: Individual Therapy  Dub Amis 11/29/2019, 2:43 PM

## 2019-11-29 NOTE — Progress Notes (Signed)
[place] PHYSICAL MEDICINE & REHABILITATION PROGRESS NOTE   Subjective/Complaints:  P Pt reports "I had 1 BM"- per nursing he had 5 BMs after Kayexylate- already on Lokelma 10G daily- K+ down to 5.6 this AM-  Also Cr up to 4.5- has continued to climb and BUN up to 87 - concerned because not sure the plan or cause.   Also nursing reports pt needs eucerin- dry skin.   ROS:  Pt denies SOB, abd pain, CP, N/V/C/D, and vision changes Of note, pt has encephalopathy- but has no complaints.     Objective:   No results found. No results for input(s): WBC, HGB, HCT, PLT in the last 72 hours. Recent Labs    11/28/19 0758 11/29/19 0236  NA 134* 136  K 5.9* 5.6*  CL 101 102  CO2 25 27  GLUCOSE 179* 125*  BUN 84* 87*  CREATININE 4.39* 4.50*  CALCIUM 8.9 8.6*    Intake/Output Summary (Last 24 hours) at 11/29/2019 1229 Last data filed at 11/29/2019 0700 Gross per 24 hour  Intake 480 ml  Output -  Net 480 ml     Physical Exam: Vital Signs Blood pressure (!) 143/63, pulse 78, temperature 98.7 F (37.1 C), temperature source Oral, resp. rate 16, height 6' (1.829 m), weight 79.6 kg, SpO2 97 %.   Physical Exam  Nursing note and vitals reviewed. 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   Neurological: no change in slowed processing- focusing on TV the whole time.   Also HOH   He lacks insight/awareness of deficits.  Motor: 4+/5 throughout  Skin: Skin is warm and dry. IVs in both arms Stasis changes bilateral shins.   Psychiatric:flat affect  Assessment/Plan: 1. Functional deficits secondary to toxic metabolic encephalopathy which require 3+ hours per day of interdisciplinary therapy in a comprehensive inpatient rehab setting.  Physiatrist is providing  close team supervision and 24 hour management of active medical problems listed below.  Physiatrist and rehab team continue to assess barriers to discharge/monitor patient progress toward functional and medical goals  Care Tool:  Bathing    Body parts bathed by patient: Right arm, Left arm, Chest, Abdomen, Front perineal area, Buttocks, Right upper leg, Left upper leg, Right lower leg, Left lower leg, Face         Bathing assist Assist Level: Moderate Assistance - Patient 50 - 74%(power up sit to stand)     Upper Body Dressing/Undressing Upper body dressing Upper body dressing/undressing activity did not occur (including orthotics): N/A What is the patient wearing?: Pull over shirt    Upper body assist Assist Level: Supervision/Verbal cueing    Lower Body Dressing/Undressing Lower body dressing      What is the patient wearing?: Incontinence brief, Hospital gown only     Lower body assist Assist for lower body dressing: Maximal Assistance - Patient 25 - 49%     Toileting Toileting    Toileting assist Assist for toileting: Total Assistance - Patient < 25%     Transfers Chair/bed transfer  Transfers assist  Chair/bed transfer activity did not occur: N/A  Chair/bed transfer assist level: Minimal Assistance - Patient > 75%     Locomotion Ambulation   Ambulation assist   Ambulation activity did not occur: N/A  Assist level: Minimal Assistance - Patient > 75% Assistive device: Cane-quad Max distance: 100'   Walk 10 feet activity   Assist     Assist level: Minimal Assistance - Patient > 75% Assistive device: Cane-quad   Walk 50 feet activity   Assist    Assist level: Minimal Assistance - Patient > 75% Assistive device: Cane-quad    Walk 150 feet activity   Assist    Assist level: Minimal Assistance - Patient > 75% Assistive device: Hand held assist    Walk 10 feet on uneven surface  activity   Assist     Assist level: Minimal  Assistance - Patient > 75% Assistive device: Hand held assist   Wheelchair     Assist Will patient use wheelchair at discharge?: No             Wheelchair 50 feet with 2 turns activity    Assist            Wheelchair 150 feet activity     Assist          Blood pressure (!) 143/63, pulse 78, temperature 98.7 F (37.1 C), temperature source Oral, resp. rate 16, height 6' (1.829 m), weight 79.6 kg, SpO2 97 %.  Medical Problem List and Plan: 1.  Deficits with mobility, transfers, self-care secondary to debility with metabolic encephalopathy.             -patient may shower             -ELOS/Goals: 7-10 days/Supervision/Mod I             Admit to CIR 2.  Antithrombotics: -DVT/anticoagulation:  Pharmaceutical: Heparin             -antiplatelet therapy: DAPT 3. Pain Management: Tylenol prn.  4. Mood: LCSW to follow for evaluation and support.              -antipsychotic agents: N/A 5. Neuropsych: This patient is not fully capable of making decisions on his own behalf. 6. Skin/Wound Care: Routine pressure relief measures.  7. Fluids/Electrolytes/Nutrition: Monitor I/Os. Monitor for signs of overload- check daily weights.  Monitor renal status for recovery.    Filed Weights   11/27/19 0503 11/28/19 0443 11/29/19 0310  Weight: 77.6 kg 79.7 kg 79.6 kg   5/21- no weight for today, but yesterday weight was down 2 kg- will monitor  5/22- Weight back to 79 kg- will monitor to make sure not retaining a lot of fluid.   5/23- Pt's weight 79.6- stable from yesterday  8. Cefipime induced encephalopathy: Improving 9. Acute on chronic renal failure: Followed by Dr. Hollie Salk. SCr-40.9 today.               CMP ordered for tomorrow.   5/20- Cr stable at 4.09- was 4.07- BUN worse at 66- was 54. Renal signed off- will recheck this weekend.   5/21- Cr came back late yesterday 4.16 and now 4.27- is still rising- if hits 4.5, will call renal back  5/22- Cr up to 4.39- still rising-  if increasing to 4.5- will call renal back to see if can help. Also hyperkalemic  5/23- Cr still rising 4.50 and BUN up to 87- concerned since not stopping the rise- called renal consult and spoke to Dr. Jonnie Finner -they will see pt.   10 HTN: Monitor BP tid--on Norvasc, Imdur, Hydralazine and Coreg.  5/20- BP well controlled- 120s/60s   5/23- BP slightly elevated but overall stable- con't meds  Monitor with increased mobility 11. NSTEMI/ICM: Has significant LAD --to be treated medically with Coreg, Hydralazine, Imdur, ASA/Plavix and Lipitor. PCI if patient progresses to ESRD.            5/22- monitoring for Sx's 12. Pseudomonas UTI: Completed IV antibiotics on 05/08.  13. BPH s/p TURP 4/13: Continue Proscar. Condom cath in place with cloudy urine in tubing. Will check PVRs to monitor voiding. Toilet every 4 hours during the day.    5/20- PVRs show very low post void residuals             Monitor for retention 14. Anemia of chronic disease: On Aranesp--increased to 174mcg/weekly today.              CBC ordered for tomorrow  5/20- Hb 7.9- aranesp just increased due to dwindling Hb.  15. Hyperkalemia  5/21- will order Lokelma 5G daily since K+ rising - was 5.0, then 5.4, then now 5.6-   5/22-will Increase lokelma to 10g and added a dose of Kayexylate to get it down faster   5/23- K= only down to 5.6 after 5 BMs yesterday- have called renal for assistance. 16. DM- A1c 8.5-   5/21- will start SSI and BG checks- will d/w pharmacy about meds considering rising renal function.   CBG (last 3)  Recent Labs    11/28/19 2117 11/29/19 0605 11/29/19 1147  GLUCAP 101* 109* 183*    5/22- BGs vAriable- but cannot add Oral agent due to rising Cr- will monitor for another day and add insulin dosing based on BGs.   5/23- BGs overall look good, actually- will con't to monitor.    LOS: 4 days A FACE TO FACE EVALUATION WAS PERFORMED  Megan Lovorn 11/29/2019, 12:29 PM

## 2019-11-29 NOTE — Consult Note (Addendum)
Renal Service Consult Note [facility]  Branson Kranz 11/29/2019 [name] Requesting Physician:  Dr. Dagoberto Ligas  Reason for Consult:  Renal failure HPI: The patient is a 69 y.o. year-old w/ hx of HTN, HL, CVA, DM2, blindness, hx urinary retention admitted 4/30 w/ AMS, pseuodmonas urosepsis and ^trop. Pt has known CKDIV b/l creat 3.5. Pt was rx'd for UTI/ sepsis and then had LHC on 5/13 (20 cc dye); cath showed LAD lesion but they did not do PCI due to CKD issue.  Over last week creat up from 3.3- 3.6 up to 4.0.  Asked to see for AoCKD.  Pt seen in his room. He denies any SOB, cough, abd pain, n/v/d, no difficulty voiding.  He denies leg swelling or orthopnea.     Recent hosp admits:  - march 2021 admit for CKD IV, urinary retention and dc'd w/ foley in place  - April 2021 admit for a/c syst CHF, ICM 40% EF, CKD IV, treated w/ IV lasix. Also underwent TURP on 10/20/19.   - 4/30- 11/25/19 > most recent admit prior to current rehab stay > as above   ROS  denies CP  no joint pain   no HA  no blurry vision  no rash  no diarrhea  no nausea/ vomiting  no dysuria  no difficulty voiding  no change in urine color    Past Medical History  Past Medical History:  Diagnosis Date  . Anemia   . Arthritis    past hx   . Blindness    right eye  . Cataract    removed both eyes  . Dehydration   . Diabetes ([place])   . Glaucoma   . History of CVA (cerebrovascular accident) 09/13/2015  . History of urinary retention   . Hyperlipidemia   . Hypertension   . Pernicious anemia 02/24/2018  . Stroke [facility])    2017- March  . Syncope 11/2019  . Tachycardia 08/26/2017  . Tubular adenoma of colon 02/2017  . Weight loss, non-intentional 08/26/2017   10 lbs between 6/18 & 2/19   Past Surgical History  Past Surgical History:  Procedure Laterality Date  . CATARACT EXTRACTION, BILATERAL    . COLONOSCOPY    . IR THORACENTESIS ASP PLEURAL SPACE W/IMG GUIDE  09/21/2019  . IR THORACENTESIS  ASP PLEURAL SPACE W/IMG GUIDE  10/16/2019  . LEFT HEART CATH AND CORONARY ANGIOGRAPHY N/A 11/19/2019   Procedure: LEFT HEART CATH AND CORONARY ANGIOGRAPHY;  Surgeon: Jettie Booze, MD;  Location: [facility];  Service: Cardiovascular;  Laterality: N/A;  . POLYPECTOMY    . REFRACTIVE SURGERY  10/2017  . TEE WITHOUT CARDIOVERSION N/A 09/14/2015   Procedure: TRANSESOPHAGEAL ECHOCARDIOGRAM (TEE);  Surgeon: Larey Dresser, MD;  Location: [facility];  Service: Cardiovascular;  Laterality: N/A;  . TRANSURETHRAL RESECTION OF PROSTATE N/A 10/20/2019   Procedure: TRANSURETHRAL RESECTION OF THE PROSTATE (TURP);  Surgeon: Irine Seal, MD;  Location: [facility];  Service: Urology;  Laterality: N/A;  . UPPER GASTROINTESTINAL ENDOSCOPY     Family History  Family History  Problem Relation Age of Onset  . Hypertension Mother   . Hyperlipidemia Mother   . Hyperlipidemia Father   . Colon cancer Neg Hx   . Colon polyps Neg Hx   . Esophageal cancer Neg Hx   . Rectal cancer Neg Hx   . Stomach cancer Neg Hx    Social History  reports that he has quit smoking. He quit smokeless tobacco use  about 41 years ago.  His smokeless tobacco use included chew. He reports that he does not drink alcohol or use drugs. Allergies No Known Allergies Home medications Prior to Admission medications   Medication Sig Start Date End Date Taking? Authorizing Provider  ACCU-CHEK FASTCLIX LANCETS MISC Check blood sugar up to 7 times a week as instructed Patient taking differently: 1 each by Other route See admin instructions. Check blood sugar up to 7 times a week as instructed 08/13/18   Isabelle Course, MD  aspirin EC 81 MG tablet Take 1 tablet (81 mg total) by mouth daily. 10/22/19   Eugenie Filler, MD  atorvastatin (LIPITOR) 80 MG tablet Take 1 tablet (80 mg total) by mouth at bedtime. IM program 07/22/19   [name], MD  Blood Glucose Monitoring Suppl (ACCU-CHEK GUIDE) w/Device KIT 1 each by Does not apply route  daily. Check blood sugar as instructed up to 7 times a week Patient taking differently: 1 each by Does not apply route See admin instructions. Check blood sugar as instructed up to 7 times a week 08/13/18   Isabelle Course, MD  clopidogrel (PLAVIX) 75 MG tablet Take 1 tablet (75 mg total) by mouth daily. 11/26/19   [name], MD  dorzolamide-timolol (COSOPT) 22.3-6.8 MG/ML ophthalmic solution Place 1 drop into the right eye 2 (two) times daily.  01/16/18   [provider]  ferrous gluconate (FERGON) 324 MG tablet Take 324 mg by mouth daily with breakfast.    [provider]  finasteride (PROSCAR) 5 MG tablet Take 5 mg by mouth daily.    [provider]  glucose blood (ACCU-CHEK GUIDE) test strip Check blood sugar up to 7 times a week as instructed Patient taking differently: 1 each by Other route See admin instructions. Check blood sugar up to 7 times a week as instructed 08/13/18   Isabelle Course, MD  hydrALAZINE (APRESOLINE) 25 MG tablet Take 3 tablets (75 mg total) by mouth 3 (three) times daily. 11/05/19   Katherine Roan, MD  isosorbide mononitrate (IMDUR) 60 MG 24 hr tablet Take 1 tablet (60 mg total) by mouth daily. 11/26/19 12/26/19  [name], MD  metoprolol succinate (TOPROL-XL) 50 MG 24 hr tablet Take 1 tablet (50 mg total) by mouth daily. Take with or immediately following a meal. 10/23/19   Eugenie Filler, MD  tamsulosin (FLOMAX) 0.4 MG CAPS capsule Take 0.4 mg by mouth daily after supper.    [provider]  vitamin B-12 (CYANOCOBALAMIN) 1000 MCG tablet Take 1,000 mcg by mouth daily.    [provider]     Vitals:   11/28/19 0443 11/28/19 1528 11/28/19 1947 11/29/19 0310  BP: (!) 147/67 124/61 133/64 (!) 143/63  Pulse: 64 71 67 78  Resp: 16 18 20 16   Temp: 98.2 F (36.8 C) 98.9 F (37.2 C) 98.5 F (36.9 C) 98.7 F (37.1 C)  TempSrc: Oral   Oral  SpO2: 100% 98% 98% 97%  Weight: 79.7 kg   79.6 kg  Height:        Exam Gen drowsy but easily awakens and answers questions appropriately No rash, cyanosis or gangrene Sclera anicteric, throat clear  No jvd or bruits, flat neck veins Chest clear bilat to bases no wheezing or rales RRR no MRG Abd soft ntnd no mass or ascites +bs GU normal male, diaper in place MS no joint effusions or deformity Ext diffuse bilat LE pretib/ hip as well as bilat abd wall  flank pitting edema 2+ Neuro is alert, Ox 3 , nf    Home meds:  - plavix 75/ imdur 60 qd/ asa 81/ hydralazine 75 tid/ toprol xl 50 qd/ lipitor 80  - flomax 0.4/ proscar 5 qd  - prn's/ vitamins/ supplements   ECHO - 10/15/19 - LVEF 40-45%  Assessment/ Plan: 1. AoCKD 4 - baseline creat 3.5, up to 4.1 today. Had contrast but that was 10 days ago and low vol 20 cc. Suspect this is cardiorenal, pt has diffuse LE, hip and flank edema.  Will need diuresis. Will check bladder scan and/or renal US to r/o retention of obstruction as well (hx retention). Plan IV lasix 80 bid to start. Daily labs. Will follow.  2. Hyperkalemia - chg to renal diet, diuresis should help too 3. Acute + chronic syst CHF/ vol excess - EF 40-45%, no resp issues, mostly peripheral edema. Check CXR.  4. HTN - on 3 bp meds, will dc hydralazine in anticipation of diuresis 5. CAD - sp LHC 5/13, LAD disease 6. DM2 7. Blindness 8. Recent TURP - April 2021       Kelly Splinter  MD 11/29/2019, 4:18 PM  Recent Labs  Lab 11/23/19 0319 11/26/19 0946  WBC 5.3 5.4  HGB 7.9* 8.5*   Recent Labs  Lab 11/28/19 0758 11/29/19 0236  K 5.9* 5.6*  BUN 84* 87*  CREATININE 4.39* 4.50*  CALCIUM 8.9 8.6*  PHOS 2.9 3.2

## 2019-11-30 ENCOUNTER — Inpatient Hospital Stay (HOSPITAL_COMMUNITY): Payer: Medicare PPO

## 2019-11-30 ENCOUNTER — Inpatient Hospital Stay (HOSPITAL_COMMUNITY): Payer: Medicare PPO | Admitting: Occupational Therapy

## 2019-11-30 LAB — RENAL FUNCTION PANEL
Albumin: 2.1 g/dL — ABNORMAL LOW (ref 3.5–5.0)
Anion gap: 9 (ref 5–15)
BUN: 89 mg/dL — ABNORMAL HIGH (ref 8–23)
CO2: 26 mmol/L (ref 22–32)
Calcium: 8.7 mg/dL — ABNORMAL LOW (ref 8.9–10.3)
Chloride: 101 mmol/L (ref 98–111)
Creatinine, Ser: 4.38 mg/dL — ABNORMAL HIGH (ref 0.61–1.24)
GFR calc Af Amer: 15 mL/min — ABNORMAL LOW (ref >60)
GFR calc non Af Amer: 13 mL/min — ABNORMAL LOW (ref >60)
Glucose, Bld: 179 mg/dL — ABNORMAL HIGH (ref 70–99)
Phosphorus: 3.9 mg/dL (ref 2.5–4.6)
Potassium: 4.2 mmol/L (ref 3.5–5.1)
Sodium: 136 mmol/L (ref 135–145)

## 2019-11-30 LAB — CBC
HCT: 27.1 % — ABNORMAL LOW (ref 39.0–52.0)
Hemoglobin: 8.6 g/dL — ABNORMAL LOW (ref 13.0–17.0)
MCH: 30 pg (ref 26.0–34.0)
MCHC: 31.7 g/dL (ref 30.0–36.0)
MCV: 94.4 fL (ref 80.0–100.0)
Platelets: 212 10*3/uL (ref 150–400)
RBC: 2.87 MIL/uL — ABNORMAL LOW (ref 4.22–5.81)
RDW: 16.3 % — ABNORMAL HIGH (ref 11.5–15.5)
WBC: 4.6 10*3/uL (ref 4.0–10.5)
nRBC: 0 % (ref 0.0–0.2)

## 2019-11-30 LAB — GLUCOSE, CAPILLARY
Glucose-Capillary: 133 mg/dL — ABNORMAL HIGH (ref 70–99)
Glucose-Capillary: 167 mg/dL — ABNORMAL HIGH (ref 70–99)
Glucose-Capillary: 212 mg/dL — ABNORMAL HIGH (ref 70–99)
Glucose-Capillary: 166 mg/dL — ABNORMAL HIGH (ref 70–99)

## 2019-11-30 MED ORDER — MELATONIN 3 MG PO TABS
3.0000 mg | ORAL_TABLET | Freq: Every day | ORAL | Status: DC
  Administered 2019-11-30 – 2019-12-03 (×4): 3 mg via ORAL
  Filled 2019-11-30 (×4): qty 1

## 2019-11-30 NOTE — Plan of Care (Signed)
  Problem: RH BLADDER ELIMINATION Goal: RH STG MANAGE BLADDER WITH ASSISTANCE Description: STG Manage Bladder With min assist   Outcome: Progressing   Problem: RH SAFETY Goal: RH STG ADHERE TO SAFETY PRECAUTIONS W/ASSISTANCE/DEVICE Description: STG Adhere to Safety Precautions With supervision   Outcome: Progressing

## 2019-11-30 NOTE — Progress Notes (Signed)
[facility] PHYSICAL MEDICINE & REHABILITATION PROGRESS NOTE  Subjective/Complaints: No complaints this morning.  Denies pain. When asked, he says he has been sleeping poorly and would like a medication to help him sleep better.   ROS: Pt denies SOB, abd pain, CP, N/V/C/D, and vision changes Of note, pt has encephalopathy- but has no complaints.   Objective:   US RENAL  Result Date: 11/29/2019 CLINICAL DATA:  Acute renal insufficiency. EXAM: RENAL / URINARY TRACT ULTRASOUND COMPLETE COMPARISON:  July 14, 2019 FINDINGS: Right Kidney: Renal measurements: 11.0 x 4.6 x 4.7 cm = volume: 125 mL . Echogenicity within normal limits. No mass or hydronephrosis visualized. Tiny amount of perinephric fluid. Left Kidney: Renal measurements: 11.1 x 5.1 x 5.2 cm = volume: 154 mL. Echogenicity within normal limits. No mass or hydronephrosis visualized. Lower pole echogenic lesion and nonobstructive midpolar calculus are not seen on today's exam. Tiny amount of perinephric fluid. Bladder: Decompressed. Other: Bilateral pleural effusions. Small volume abdominal ascites. IMPRESSION: Normal appearance of the kidneys apart from tiny amount of perinephric fluid bilaterally. Bilateral pleural effusions. Small volume abdominal ascites. Electronically Signed   By: Fidela Salisbury M.D.   On: 11/29/2019 19:11   DG CHEST PORT 1 VIEW  Result Date: 11/29/2019 CLINICAL DATA:  Chest pain and shortness of breath. EXAM: PORTABLE CHEST 1 VIEW COMPARISON:  Nov 07, 2019 FINDINGS: Enlarged cardiac silhouette. Calcific atherosclerotic disease of the aorta. Mediastinal contours appear intact. Bilateral layering pleural effusions. Pulmonary vascular congestion. Osseous structures are without acute abnormality. Soft tissues are grossly normal. IMPRESSION: 1. Bilateral layering pleural effusions with pulmonary vascular congestion. 2. Calcific atherosclerotic disease of the aorta. 3. Enlarged cardiac silhouette. Electronically Signed    By: Fidela Salisbury M.D.   On: 11/29/2019 18:19   Recent Labs    11/30/19 0932  WBC 4.6  HGB 8.6*  HCT 27.1*  PLT 212   Recent Labs    11/29/19 0236 11/30/19 0932  NA 136 136  K 5.6* 4.2  CL 102 101  CO2 27 26  GLUCOSE 125* 179*  BUN 87* 89*  CREATININE 4.50* 4.38*  CALCIUM 8.6* 8.7*    Intake/Output Summary (Last 24 hours) at 11/30/2019 1053 Last data filed at 11/30/2019 0700 Gross per 24 hour  Intake 342 ml  Output 1150 ml  Net -808 ml     Physical Exam: Vital Signs Blood pressure 134/68, pulse 65, temperature 98.2 F (36.8 C), temperature source Oral, resp. rate 16, height 6' (1.829 m), weight 79.5 kg, SpO2 99 %.  Physical Exam  Nursing note and vitals reviewed. Constitutional: pt awake and alert; sitting up in chair.  HENT: blind R eye- non reactive pupil  CV:  RRR Respiratory: CTA B/L GI: Soft, NT, ND, (+)BS    Musculoskeletal:     Comments: 1+ pitting edema right forearm.   Neurological: no change in slowed processing- focusing on TV the whole time.   Also HOH   He lacks insight/awareness of deficits.  Motor: 4+/5 throughout  Skin: Skin is warm and dry. IVs in both arms Stasis changes bilateral shins.   Psychiatric:flat affect  Assessment/Plan: 1. Functional deficits secondary to toxic metabolic encephalopathy which require 3+ hours per day of interdisciplinary therapy in a comprehensive inpatient rehab setting.  Physiatrist is providing close team supervision and 24 hour management of active medical problems listed below.  Physiatrist and rehab team continue to assess barriers to discharge/monitor patient progress toward functional and medical goals  Care Tool:  Bathing  Body parts bathed by patient: Right arm, Left arm, Chest, Abdomen, Front perineal area, Buttocks, Right upper leg, Left upper leg, Right lower leg, Left lower leg, Face         Bathing assist Assist Level: Moderate Assistance - Patient 50 - 74%(power up sit to stand)      Upper Body Dressing/Undressing Upper body dressing Upper body dressing/undressing activity did not occur (including orthotics): N/A What is the patient wearing?: Pull over shirt    Upper body assist Assist Level: Supervision/Verbal cueing    Lower Body Dressing/Undressing Lower body dressing      What is the patient wearing?: Incontinence brief, Hospital gown only     Lower body assist Assist for lower body dressing: Maximal Assistance - Patient 25 - 49%     Toileting Toileting    Toileting assist Assist for toileting: Total Assistance - Patient < 25%     Transfers Chair/bed transfer  Transfers assist     Chair/bed transfer assist level: Minimal Assistance - Patient > 75%     Locomotion Ambulation   Ambulation assist      Assist level: Contact Guard/Touching assist Assistive device: Walker-rolling Max distance: 175   Walk 10 feet activity   Assist     Assist level: Contact Guard/Touching assist Assistive device: Walker-rolling   Walk 50 feet activity   Assist    Assist level: Contact Guard/Touching assist Assistive device: Walker-rolling    Walk 150 feet activity   Assist    Assist level: Contact Guard/Touching assist Assistive device: Walker-rolling    Walk 10 feet on uneven surface  activity   Assist     Assist level: Minimal Assistance - Patient > 75% Assistive device: Hand held assist   Wheelchair     Assist Will patient use wheelchair at discharge?: No             Wheelchair 50 feet with 2 turns activity    Assist            Wheelchair 150 feet activity     Assist          Blood pressure 134/68, pulse 65, temperature 98.2 F (36.8 C), temperature source Oral, resp. rate 16, height 6' (1.829 m), weight 79.5 kg, SpO2 99 %.  Medical Problem List and Plan: 1.  Deficits with mobility, transfers, self-care secondary to debility with metabolic encephalopathy.             -patient may shower              -ELOS/Goals: 7-10 days/Supervision/Mod I             Continue CIR 2.  Antithrombotics: -DVT/anticoagulation:  Pharmaceutical: Heparin             -antiplatelet therapy: DAPT 3. Pain Management: Tylenol prn. Well controlled 4. Mood: LCSW to follow for evaluation and support.              -antipsychotic agents: N/A 5. Neuropsych: This patient is not fully capable of making decisions on his own behalf. 6. Skin/Wound Care: Routine pressure relief measures.  7. Fluids/Electrolytes/Nutrition: Monitor I/Os. Monitor for signs of overload- check daily weights.  Monitor renal status for recovery.    Filed Weights   11/28/19 0443 11/29/19 0310 11/30/19 0436  Weight: 79.7 kg 79.6 kg 79.5 kg   5/21- no weight for today, but yesterday weight was down 2 kg- will monitor  5/22- Weight back to 79 kg- will monitor to make sure not retaining  a lot of fluid.   5/23- Pt's weight 79.6- stable from yesterday  8. Cefipime induced encephalopathy: Improving 9. Acute on chronic renal failure: Followed by Dr. Hollie Salk. SCr-40.9 today.               CMP ordered for tomorrow.   5/20- Cr stable at 4.09- was 4.07- BUN worse at 66- was 54. Renal signed off- will recheck this weekend.   5/21- Cr came back late yesterday 4.16 and now 4.27- is still rising- if hits 4.5, will call renal back  5/22- Cr up to 4.39- still rising- if increasing to 4.5- will call renal back to see if can help. Also hyperkalemic  5/23- Cr still rising 4.50 and BUN up to 87- concerned since not stopping the rise- called renal consult and spoke to Dr. Jonnie Finner -they will see pt.  10 HTN: Monitor BP tid--on Norvasc, Imdur, Hydralazine and Coreg.  5/20- BP well controlled- 120s/60s   5/23- BP slightly elevated but overall stable- con't meds             Monitor with increased mobility 11. NSTEMI/ICM: Has significant LAD --to be treated medically with Coreg, Hydralazine, Imdur, ASA/Plavix and Lipitor. PCI if patient progresses to ESRD.             5/22- monitoring for Sx's 12. Pseudomonas UTI: Completed IV antibiotics on 05/08.  13. BPH s/p TURP 4/13: Continue Proscar. Condom cath in place with cloudy urine in tubing. Will check PVRs to monitor voiding. Toilet every 4 hours during the day.    5/20- PVRs show very low post void residuals             Monitor for retention 14. Anemia of chronic disease: On Aranesp--increased to 165mcg/weekly today.              CBC ordered for tomorrow  5/20- Hb 7.9- aranesp just increased due to dwindling Hb.  15. Hyperkalemia  5/21- will order Lokelma 5G daily since K+ rising - was 5.0, then 5.4, then now 5.6-   5/22-will Increase lokelma to 10g and added a dose of Kayexylate to get it down faster   5/23- K= only down to 5.6 after 5 BMs yesterday- have called renal for assistance. 16. DM- A1c 8.5-   5/21- will start SSI and BG checks- will d/w pharmacy about meds considering rising renal function.   CBG (last 3)  Recent Labs    11/29/19 1745 11/29/19 2100 11/30/19 0613  GLUCAP 150* 237* 133*    5/22- BGs vAriable- but cannot add Oral agent due to rising Cr- will monitor for another day and add insulin dosing based on BGs.   5/23- BGs overall look good, actually- will con't to monitor.   5/24: CBGs little elevated last night, well controlled today. Continue to monitor.  17. Anemia: Hgb 8.6; stable.  18. Insomnia: started Melatonin.   LOS: 5 days A FACE TO FACE EVALUATION WAS PERFORMED  Clide Deutscher Allyn Bartelson 11/30/2019, 10:53 AM

## 2019-11-30 NOTE — Progress Notes (Signed)
[facility] Progress Note   Subjective:   No new complaints. I/Os 342 / 1150 yesterday after lasix added. Wt essentially unchanged c/w admission 5/19.  Renal US ok.   Objective Vitals:   11/29/19 1654 11/29/19 2006 11/30/19 0436 11/30/19 0437  BP: 136/63 137/63  134/68  Pulse: 69 70  65  Resp: 18 18  16   Temp: 98.6 F (37 C) 98 F (36.7 C)  98.2 F (36.8 C)  TempSrc:  Oral  Oral  SpO2: 98% 97%  99%  Weight:   79.5 kg   Height:       Physical Exam General: lying flat in bed on RA Heart: RRR, no rub Lungs: normal WOB, clear ant Abdomen: soft, nontender Extremities: 1+ LE edema Neuro:  AOx3, seems unclear on details  Additional Objective Labs: Basic Metabolic Panel: Recent Labs  Lab 11/27/19 0700 11/28/19 0758 11/29/19 0236  NA 134* 134* 136  K 5.6* 5.9* 5.6*  CL 101 101 102  CO2 26 25 27   GLUCOSE 193* 179* 125*  BUN 78* 84* 87*  CREATININE 4.27* 4.39* 4.50*  CALCIUM 8.8* 8.9 8.6*  PHOS 2.5 2.9 3.2   Liver Function Tests: Recent Labs  Lab 11/25/19 1819 11/26/19 0946 11/27/19 0700 11/28/19 0758 11/29/19 0236  AST 13*  --   --   --   --   ALT 11  --   --   --   --   ALKPHOS 47  --   --   --   --   BILITOT 0.4  --   --   --   --   PROT 4.8*  --   --   --   --   ALBUMIN 2.0*   < > 2.2* 2.2* 2.1*   < > = values in this interval not displayed.   No results for input(s): LIPASE, AMYLASE in the last 168 hours. CBC: Recent Labs  Lab 11/26/19 0946 11/30/19 0932  WBC 5.4 4.6  NEUTROABS 3.8  --   HGB 8.5* 8.6*  HCT 26.7* 27.1*  MCV 94.0 94.4  PLT 200 212   Blood Culture    Component Value Date/Time   SDES URINE, RANDOM 11/13/2019 2212   SPECREQUEST  11/13/2019 2212    NONE Performed at [facility], [place] [address], [place], [place] [address]    CULT MULTIPLE SPECIES PRESENT, SUGGEST RECOLLECTION (A) 11/13/2019 2212   REPTSTATUS 11/15/2019 FINAL 11/13/2019 2212    Cardiac Enzymes: No results for input(s): CKTOTAL, CKMB,  CKMBINDEX, TROPONINI in the last 168 hours. CBG: Recent Labs  Lab 11/29/19 0605 11/29/19 1147 11/29/19 1745 11/29/19 2100 11/30/19 0613  GLUCAP 109* 183* 150* 237* 133*   Iron Studies: No results for input(s): IRON, TIBC, TRANSFERRIN, FERRITIN in the last 72 hours. @lablastinr3 @ Studies/Results: US RENAL  Result Date: 11/29/2019 CLINICAL DATA:  Acute renal insufficiency. EXAM: RENAL / URINARY TRACT ULTRASOUND COMPLETE COMPARISON:  July 14, 2019 FINDINGS: Right Kidney: Renal measurements: 11.0 x 4.6 x 4.7 cm = volume: 125 mL . Echogenicity within normal limits. No mass or hydronephrosis visualized. Tiny amount of perinephric fluid. Left Kidney: Renal measurements: 11.1 x 5.1 x 5.2 cm = volume: 154 mL. Echogenicity within normal limits. No mass or hydronephrosis visualized. Lower pole echogenic lesion and nonobstructive midpolar calculus are not seen on today's exam. Tiny amount of perinephric fluid. Bladder: Decompressed. Other: Bilateral pleural effusions. Small volume abdominal ascites. IMPRESSION: Normal appearance of the kidneys apart from tiny amount of perinephric fluid  bilaterally. Bilateral pleural effusions. Small volume abdominal ascites. Electronically Signed   By: Fidela Salisbury M.D.   On: 11/29/2019 19:11   DG CHEST PORT 1 VIEW  Result Date: 11/29/2019 CLINICAL DATA:  Chest pain and shortness of breath. EXAM: PORTABLE CHEST 1 VIEW COMPARISON:  Nov 07, 2019 FINDINGS: Enlarged cardiac silhouette. Calcific atherosclerotic disease of the aorta. Mediastinal contours appear intact. Bilateral layering pleural effusions. Pulmonary vascular congestion. Osseous structures are without acute abnormality. Soft tissues are grossly normal. IMPRESSION: 1. Bilateral layering pleural effusions with pulmonary vascular congestion. 2. Calcific atherosclerotic disease of the aorta. 3. Enlarged cardiac silhouette. Electronically Signed   By: Fidela Salisbury M.D.   On: 11/29/2019 18:19    Medications:  . amLODipine  5 mg Oral Daily  . aspirin  81 mg Oral Daily  . atorvastatin  80 mg Oral QHS  . carvedilol  25 mg Oral BID WC  . clopidogrel  75 mg Oral Daily  . [START ON 12/01/2019] darbepoetin (ARANESP) injection - NON-DIALYSIS  100 mcg Subcutaneous Q Tue-1800  . dorzolamide-timolol  1 drop Right Eye BID  . feeding supplement (ENSURE ENLIVE)  237 mL Oral TID BM  . feeding supplement (PRO-STAT SUGAR FREE 64)  30 mL Oral BID  . ferrous gluconate  324 mg Oral Q breakfast  . finasteride  5 mg Oral Daily  . furosemide  80 mg Intravenous BID  . heparin  5,000 Units Subcutaneous Q8H  . hydrocerin   Topical BID  . insulin aspart  0-5 Units Subcutaneous QHS  . insulin aspart  0-6 Units Subcutaneous TID WC  . isosorbide mononitrate  60 mg Oral Daily  . mouth rinse  15 mL Mouth Rinse BID  . sodium zirconium cyclosilicate  10 g Oral Daily  . tamsulosin  0.4 mg Oral QPC supper  . vitamin B-12  1,000 mcg Oral Daily    Assessment/ Plan: 1. AoCKD 4 - baseline creat 3.5, up to 4.5 yesterday. Had contrast but that was 10 days ago and low vol 20 cc. Suspect this is cardiorenal, pt has diffuse LE, hip and flank edema.  Renal US without obstruction.  Check bladder scan qShift with h/o retention.  FeNa c/w intrinsic issue (though with diuretic on board). Cont IV lasix 80 BID. Daily labs.  2. Hyperkalemia - resolved 3. Acute + chronic syst CHF/ vol excess - EF 40-45%, no resp issues, mostly peripheral edema though 5/23 CXR with pleural effusions and pulmonary edema.  4. HTN - on 3 bp meds, have dc'd hydralazine in anticipation of diuresis - BP fine this AM.  5. CAD - sp LHC 5/13, LAD disease 6. DM2 7. Blindness 8. Recent TURP - April 2021   [name] 11/30/2019, 10:09 AM  [facility] Pager: [phone]

## 2019-11-30 NOTE — Progress Notes (Signed)
[facility] Individual Statement of Services  Patient Name:  Mykle Pascua  Date:  11/30/2019  Welcome to the [facility].  Our goal is to provide you with an individualized program based on your diagnosis and situation, designed to meet your specific needs.  With this comprehensive rehabilitation program, you will be expected to participate in at least 3 hours of rehabilitation therapies Monday-Friday, with modified therapy programming on the weekends.  Your rehabilitation program will include the following services:  Physical Therapy (PT), Occupational Therapy (OT), Speech Therapy (ST), 24 hour per day rehabilitation nursing, Therapeutic Recreaction (TR), Psychology, Neuropsychology, Care Coordinator, Rehabilitation Medicine, Nutrition Services, Pharmacy Services and Other  Weekly team conferences will be held on Tuesdays to discuss your progress.  Your Inpatient Rehabilitation Care Coordinator will talk with you frequently to get your input and to update you on team discussions.  Team conferences with you and your family in attendance may also be held.  Expected length of stay: 7 days    Overall anticipated outcome: Supervision  Depending on your progress and recovery, your program may change. Your Inpatient Rehabilitation Care Coordinator will coordinate services and will keep you informed of any changes. Your Inpatient Rehabilitation Care Coordinator's name and contact numbers are listed  below.  The following services may also be recommended but are not provided by the [facility] will be made to provide these services after discharge if needed.  Arrangements include referral to agencies that provide these services.  Your insurance has been verified to be:  [employer]  Your primary  doctor is:  Rylee [name]   Pertinent information will be shared with your doctor and your insurance company.  Inpatient Rehabilitation Care Coordinator:  Cathleen Corti [phone] or ([phone]  Information discussed with and copy given to patient by: Rana Snare, 11/30/2019, 10:14 AM

## 2019-11-30 NOTE — Progress Notes (Signed)
Inpatient Rehabilitation Care Coordinator Assessment and Plan  Patient Details  Name: Dennis Macias MRN: [id number] Date of Birth: [birth year]  Today's Date: 11/30/2019  Problem List:  Patient Active Problem List   Diagnosis Date Noted  . Toxic metabolic encephalopathy 75/04/2584  . Anemia of chronic disease   . Benign essential HTN   . AKI (acute kidney injury) ([place])   . Protein-calorie malnutrition, severe 11/19/2019  . Acute ischemic left ICA stroke ([place])   . Bacteremia   . Hypertensive urgency   . Gait disorder   . Encephalopathies   . Chronic combined systolic and diastolic heart failure ([place])   . Non-STEMI (non-ST elevated myocardial infarction) ([place])   . Syncope 11/06/2019  . Acute combined systolic and diastolic congestive heart failure ([place]) 10/21/2019  . Ischemic cardiomyopathy: Probable 10/21/2019  . Pleural effusion transudative   . S/P TURP   . Cardiorenal syndrome   . Dyspnea 10/15/2019  . Elevated TSH 09/29/2019  . Pleural effusion 09/21/2019  . Fatigue 09/17/2019  . Hematuria, unspecified 08/13/2019  . Nephrotic syndrome 08/05/2019  . Protein calorie malnutrition ([place]) 07/31/2019  . Left renal mass 07/21/2019  . CKD (chronic kidney disease) stage 4, GFR 15-29 ml/min (HCC) 04/23/2019  . Hip weakness 08/13/2018  . Pernicious anemia 02/24/2018  . Weight loss, non-intentional 08/26/2017  . 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 Syncope and collapse 02/26/2017  . BPH with urinary obstruction   . Normocytic anemia 12/21/2016  . 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 History of stroke 09/13/2015  . Hyperlipidemia 09/13/2015  . Diabetes mellitus with retinopathy of both eyes ([place])   . Essential hypertension    Past Medical History:  Past Medical History:  Diagnosis Date  . Anemia   . Arthritis    past hx   . Blindness    right eye  . Cataract    removed both eyes  . Dehydration   . Diabetes ([place])   . Glaucoma   . History of CVA  (cerebrovascular accident) 09/13/2015  . History of urinary retention   . Hyperlipidemia   . Hypertension   . Pernicious anemia 02/24/2018  . Stroke [facility])    2017- March  . Syncope 11/2019  . Tachycardia 08/26/2017  . Tubular adenoma of colon 02/2017  . Weight loss, non-intentional 08/26/2017   10 lbs between 6/18 & 2/19   Past Surgical History:  Past Surgical History:  Procedure Laterality Date  . CATARACT EXTRACTION, BILATERAL    . COLONOSCOPY    . IR THORACENTESIS ASP PLEURAL SPACE W/IMG GUIDE  09/21/2019  . IR THORACENTESIS ASP PLEURAL SPACE W/IMG GUIDE  10/16/2019  . LEFT HEART CATH AND CORONARY ANGIOGRAPHY N/A 11/19/2019   Procedure: LEFT HEART CATH AND CORONARY ANGIOGRAPHY;  Surgeon: Jettie Booze, MD;  Location: [facility];  Service: Cardiovascular;  Laterality: N/A;  . POLYPECTOMY    . REFRACTIVE SURGERY  10/2017  . TEE WITHOUT CARDIOVERSION N/A 09/14/2015   Procedure: TRANSESOPHAGEAL ECHOCARDIOGRAM (TEE);  Surgeon: Larey Dresser, MD;  Location: [facility];  Service: Cardiovascular;  Laterality: N/A;  . TRANSURETHRAL RESECTION OF PROSTATE N/A 10/20/2019   Procedure: TRANSURETHRAL RESECTION OF THE PROSTATE (TURP);  Surgeon: Irine Seal, MD;  Location: [facility];  Service: Urology;  Laterality: N/A;  . UPPER GASTROINTESTINAL ENDOSCOPY     Social History:  reports that he has quit smoking. He quit smokeless tobacco use about 41 years ago.  His smokeless tobacco use included chew. He reports that he  does not drink alcohol or use drugs.  Family / Support Systems Marital Status: Single Patient Roles: Spouse Spouse/Significant Other: [name] (significant other/togert for 30 days) [phone] Children: Blended family; 2 children together; and 1 other child Anticipated Caregiver: s/o and son Adam Ability/Limitations of Caregiver: s/o does work but states they will have support from their son Caregiver Availability: 24/7 Family Dynamics: Blended family. Pt lives with his s/o  [name]  Social History Preferred language: English Religion: None Cultural Background: Pt worked in [employer] for [employer]; and 2 yrs at the [employer]: [place]: Yes Write: Yes Employment Status: Retired Public relations account executive Issues: Denies Guardian/Conservator: N/A   Abuse/Neglect Abuse/Neglect Assessment Can Be Completed: Yes Physical Abuse: Denies Verbal Abuse: Denies Sexual Abuse: Denies Exploitation of patient/patient's resources: Denies Self-Neglect: Denies  Emotional Status Pt's affect, behavior and adjustment status: Pt in good spirits Recent Psychosocial Issues: Denies Psychiatric History: Denies Substance Abuse History: Denies  Patient / Family Perceptions, Expectations & Goals Pt/Family understanding of illness & functional limitations: Be as independent as possible Premorbid pt/family roles/activities: Independent Anticipated changes in roles/activities/participation: Assistance with ADLs; IADLs  Community Resources [employer] Scripts: None Premorbid Home Care/DME Agencies: None Transportation available at discharge: Family  Discharge Planning Living Arrangements: Spouse/significant other, Children Support Systems: Spouse/significant other, Children Type of Residence: Private residence Insurance Resources: Teacher, adult education Resources: Welcome Referred: No Living Expenses: Education officer, community Management: Significant Other, Spouse Does the patient have any problems obtaining your medications?: No Home Management: S/o manages home Care Coordinator Anticipated Follow Up Needs: HH/OP Expected length of stay: 7 days  Clinical Impression SW met with pt in room to introduce self, explain role, and discuss discharge process. Pt is not a English as a second language teacher. No HCPOA. Pt admits he used to chew tobacco for 20 yrs, and used to smoke ciagerettes but quit a few years (unbale to recall when). DME: w/c, cane, 3i1n BSC, RW, and lift chair.   SW spoke with  pt s/o [name] [phone]) who confirms she and their son Quita Skye will provide care to patient. SW to provide updates from team conference.   Joel Mericle A Izora Benn 11/30/2019, 2:07 PM

## 2019-11-30 NOTE — Progress Notes (Signed)
Occupational Therapy Session Note  Patient Details  Name: Dennis Macias MRN: [id number] Date of Birth: [birth year]  Today's Date: 11/30/2019 OT Individual Time: 9407-6808 OT Individual Time Calculation (min): 54 min    Short Term Goals: Week 1:  OT Short Term Goal 1 (Week 1): stg=ltg d/t elos  Skilled Therapeutic Interventions/Progress Updates:    Pt greeted at time of session reclined in bed supine, agreeable to OT session to wash up at sink level. Bed mobility tasks for supine to sitting up at EOB with supervision. Sit to stand at [facility] with CGA and ambulated to stand at sink level for bathing. Doffed UB clothing with CGA, LB clothing with Min A d/t patient had on two layers of socks and needed assistance getting pants over feet. Pt performed UB bathing with supervision for thoroughness and CGA for LB bathing to reach feet using figure four method. Pt able to dry himself off with CGA in seated position for UB and static standing position for LB. UB dressing performed with supervision for occasional cues for problem solving and Min for LB to thread feet into brief and pants but pt able to static stand and don over hips with no UE support on RW. Cues provided throughout bathing/dressing session for pushing up from surface for sit to stands and reaching back for handles when sitting. Discussed DME needs for home, pt does have 3-in-1 commode seat and shower seat, but may need shower bench. Pt taken to ADL suite to practice shower transfers since he has a tub, SPT to transfer bench with CGA with cues for foot/hand placement. Pt ambulated approx 50 feet from suite to his room with RW. Collaborated with pt regarding upcoming caregiver education session for his girlfriend Dennis Macias to attend. Pt returned to bed in supine with supervision, call bell in reach, alarm on, all needs met.   Therapy Documentation Precautions:  Precautions Precautions: Fall Precaution Comments: monitor for agitation Restrictions Weight  Bearing Restrictions: No   Therapy/Group: Individual Therapy  Viona Gilmore 11/30/2019, 2:59 PM

## 2019-11-30 NOTE — Progress Notes (Signed)
Physical Therapy Session Note  Patient Details  Name: Dennis Macias MRN: [id number] Date of Birth: 1951/01/11  Today's Date: 11/30/2019 PT Individual Time: 1165-7903 and 1300-1330  PT Individual Time Calculation (min): 43 min and 30 min  Today's Date: 11/30/2019 PT Missed Time: 17 Minutes and 30 Minutes Missed Time Reason: Patient fatigue;Patient unwilling to participate  Short Term Goals: Week 1:  PT Short Term Goal 1 (Week 1): = to LTGs based on ELOS  Skilled Therapeutic Interventions/Progress Updates:     1st Session: Pt received sleeping in bed. Easily roused and agreeable to therapy. No report of pain. Supine>sit mod(I) with bed features. Sit to stand and stand pivot transfer to [facility] with minA. WC transport to therapy gym for time management. Pt performs standing balance activity, playing cornhole, reaching initially R laterally for bags during first bout. 2nd bout pt reaches left laterally. 3rd bout pt reaches anteriorly. Pt requires CGA/minA for safety while standing but no overt LOBs. Seated rest breaks between each bout. Pt primarily requires minA for each sit to stand with cues for anterior weight shift. Pt requesting to go back to bed due to feeling fatigued and says he did not get any sleep previous night. Pt ambulates 100' x2 with minA and quad cane. PT provides verbal and tactile cues for increased extension through trunk and shoulders. Pt appears generally unsteady with slight forward lean but no LOBs. Sit to supine with mod(I). Pt left supine in bed with alarm intact and all needs within reach.  2nd Session: Pt received supine in bed and agreeable to therapy. No report of pain. Bed mobility mod(I) with bed features. Bed to [facility] transfer with stand step technique and CGA. WC transport to therapy gym for energy conservation. Pt participates in BERG balance assessment, demonstrating improvement relative to previous BERG administration at eval. Pt requires frequent rest breaks during  assessment and refuses to attempt final 3 balance tasks due to complaint of being tired and wanting to go back to bed. PT attempts to gently encourage participation but pt adamant on returning to room and resting. Stand step transfer back to bed with CGA and sit to supine with mod(I). Pt left supine in bed with alarm intact and all needs within reach. RN aware.   Therapy Documentation Precautions:  Precautions Precautions: Fall Precaution Comments: monitor for agitation Restrictions Weight Bearing Restrictions: No  Therapy/Group: Individual Therapy  Breck Coons, PT, DPT 11/30/2019, 3:07 PM

## 2019-11-30 NOTE — Progress Notes (Signed)
Occupational Therapy Session Note  Patient Details  Name: Tashan Kreitzer MRN: [id number] Date of Birth: [birth year]  Today's Date: 11/30/2019 OT Individual Time: 2376-2831 OT Individual Time Calculation (min): 27 min    Short Term Goals: Week 1:  OT Short Term Goal 1 (Week 1): stg=ltg d/t elos   Skilled Therapeutic Interventions/Progress Updates:    Pt received supine with no c/o pain, agreeable to therapy. Pt transitioned to EOB with (S). Pt opted to use quad cane to complete short distance functional mobility to the sink. Pt with poor cane use, often carrying it and requiring mod cueing for correct technique. Min A overall for frequent balance perturbations. Pt stood at the sink and completed oral care with min guard for balance support. Pt took a seated rest break following and requested to go outside. Pt was transported outside and he completed 10 ft of functional mobility with quad cane with min A over uneven surface. Min cueing for cane use. Pt returned to his room and was left supine with all needs met, bed alarm set.   Therapy Documentation Precautions:  Precautions Precautions: Fall Precaution Comments: monitor for agitation Restrictions Weight Bearing Restrictions: No  Therapy/Group: Individual Therapy  Curtis Sites 11/30/2019, 9:30 AM

## 2019-12-01 ENCOUNTER — Inpatient Hospital Stay (HOSPITAL_COMMUNITY): Payer: Medicare PPO

## 2019-12-01 ENCOUNTER — Inpatient Hospital Stay (HOSPITAL_COMMUNITY): Payer: Medicare PPO | Admitting: Occupational Therapy

## 2019-12-01 LAB — RENAL FUNCTION PANEL
Albumin: 2.1 g/dL — ABNORMAL LOW (ref 3.5–5.0)
Anion gap: 9 (ref 5–15)
BUN: 95 mg/dL — ABNORMAL HIGH (ref 8–23)
CO2: 25 mmol/L (ref 22–32)
Calcium: 8.7 mg/dL — ABNORMAL LOW (ref 8.9–10.3)
Chloride: 102 mmol/L (ref 98–111)
Creatinine, Ser: 4.35 mg/dL — ABNORMAL HIGH (ref 0.61–1.24)
GFR calc Af Amer: 15 mL/min — ABNORMAL LOW (ref >60)
GFR calc non Af Amer: 13 mL/min — ABNORMAL LOW (ref >60)
Glucose, Bld: 157 mg/dL — ABNORMAL HIGH (ref 70–99)
Phosphorus: 4.3 mg/dL (ref 2.5–4.6)
Potassium: 4.3 mmol/L (ref 3.5–5.1)
Sodium: 136 mmol/L (ref 135–145)

## 2019-12-01 LAB — GLUCOSE, CAPILLARY
Glucose-Capillary: 175 mg/dL — ABNORMAL HIGH (ref 70–99)
Glucose-Capillary: 139 mg/dL — ABNORMAL HIGH (ref 70–99)
Glucose-Capillary: 206 mg/dL — ABNORMAL HIGH (ref 70–99)
Glucose-Capillary: 181 mg/dL — ABNORMAL HIGH (ref 70–99)

## 2019-12-01 MED ORDER — FUROSEMIDE 10 MG/ML IJ SOLN: 80.0000 mg | Freq: Every day | INTRAMUSCULAR | Status: DC

## 2019-12-01 MED ORDER — FUROSEMIDE 10 MG/ML IJ SOLN
80.0000 mg | Freq: Two times a day (BID) | INTRAMUSCULAR | Status: DC
  Administered 2019-12-01 – 2019-12-02 (×2): 80 mg via INTRAVENOUS
  Filled 2019-12-01 (×2): qty 8

## 2019-12-01 NOTE — Progress Notes (Signed)
Physical Therapy Session Note  Patient Details  Name: Dennis Macias MRN: [id number] Date of Birth: 01-20-1951  Today's Date: 12/01/2019 PT Individual Time: 0800-0900 PT Individual Time Calculation (min): 60 min   Short Term Goals: Week 1:  PT Short Term Goal 1 (Week 1): = to LTGs based on ELOS  Skilled Therapeutic Interventions/Progress Updates:     Pt received supine in bed. Agreeable to therapy. Reports pain in R knee during session while ambulating. PT provides RW to help unweight RLE to manage pain symptoms.   Supine to sit mod(I) with bed features. PT provides minA to don upper and lower body clothing. CGA sit to stand throughout session. Pt has x1-2 posterior LOBs while standing at EOB to perform clothing management. [place] reqiured for safety. Ambulation to therapy gym x120' with RW and CGA. Cues for upright gaze, trunk extension, and increased proximity to RW for safety.   Pt attempts standing on red wedge to promote anterior weight shift with static standing. Pt requires min/modA to maintain balance on wedge and quickly becomes frustrated with activity, requesting to go back to room. PT encourages pt to participate and attempts to find therapy treatments that pt would agree to. Pt agrees to [employer].  Nustep on workload of 3 for 15:25 for 858 steps. Performed for endurance training and reciprocal coordination training. PT encourages pt to attempt ambulation following Nustep but pt refuses. WC transport back to room. Pt's son in room and PT provides family ed on safe guarding of pt and strategies to assist pt progress mobility following discharge. Son verbalizes understanding. Pt performs WC to bed transfer with CGA and sit to supine at mod(I). Pt left supine in bed with alarm intact and all needs within reach.  Therapy Documentation Precautions:  Precautions Precautions: Fall Precaution Comments: monitor for agitation Restrictions Weight Bearing Restrictions: No    Therapy/Group:  Individual Therapy  Breck Coons, PT, DPT 12/01/2019, 8:31 AM

## 2019-12-01 NOTE — Progress Notes (Signed)
Occupational Therapy Session Note  Patient Details  Name: Dennis Macias MRN: [id number] Date of Birth: [birth year]  Today's Date: 12/01/2019 OT Individual Time: 0700-0731 OT Individual Time Calculation (min): 31 min make -up session   Short Term Goals: Week 1:  OT Short Term Goal 1 (Week 1): stg=ltg d/t elos  Skilled Therapeutic Interventions/Progress Updates:    Upon entering the room, pt supine in bed and sleeping soundly. Pt very slow to wake this session. Pt declined toileting and self care tasks and requesting to eat breakfast. Pt asking therapist to set up tray and therapist encouraged pt to perform theses tasks himself which he was able to do. OT reviewed schedule for today, therapy goals, and plans for conference today to discuss d/c plan. Pt verbalized understanding and asking appropriate questions. Pt declined further intervention at this time and continues to eat breakfast as therapist exits the room. Bed alarm activated and call bell within reach.   Therapy Documentation Precautions:  Precautions Precautions: Fall Precaution Comments: monitor for agitation Restrictions Weight Bearing Restrictions: No General:   Vital Signs: Therapy Vitals Temp: 98.2 F (36.8 C) Temp Source: Oral Pulse Rate: 63 Resp: 17 BP: (!) 151/70 Patient Position (if appropriate): Lying Oxygen Therapy SpO2: 99 % O2 Device: Room Air Pain:   ADL: ADL Upper Body Bathing: Setup Where Assessed-Upper Body Bathing: Shower Lower Body Bathing: Moderate assistance Where Assessed-Lower Body Bathing: Shower Upper Body Dressing: Supervision/safety Where Assessed-Upper Body Dressing: Sitting at sink, Wheelchair Lower Body Dressing: Maximal assistance Where Assessed-Lower Body Dressing: Sitting at sink, Medical laboratory scientific officer: Minimal assistance Tub/Shower Transfer: Minimal assistance Tub/Shower Transfer Method: Stand pivot Tub/Shower Equipment: Transfer tub bench   Therapy/Group: Individual  Therapy  Gypsy Decant 12/01/2019, 7:34 AM

## 2019-12-01 NOTE — Progress Notes (Signed)
Occupational Therapy Session Note  Patient Details  Name: Dennis Macias MRN: [id number] Date of Birth: 12/26/1950  Today's Date: 12/01/2019 OT Individual Time: 2458-0998   &  1330-1426 OT Individual Time Calculation (min): 60 min & 56 min   Short Term Goals: Week 1:  OT Short Term Goal 1 (Week 1): stg=ltg d/t elos  Skilled Therapeutic Interventions/Progress Updates:    AM session:   Patient in bed, alert and agreeable to therapy session.  Son present.  Reviewed pt's current functional level and assistance needs for self care and mobility.  Patient completed bed mobility with CS.  Sit to stand from low bed surface with CGA.  Ambulation with RW to/from toilet for continent toileting with CGA min cues for safety.  He is able to ambulate with RW to/from therapy gym with CGA/CS, occ LOB with contact guard to steady.  Reviewed and practiced DME for home environment.  Son confirms that patient has a 3 in 1 commode at home.  He states that he will check to see if family has access to tub transfer bench.  Patient completed transfer to tub bench in tub/shower with CS.  Patient with occ difficulty getting up from low surfaces without arm rests - reviewed optimal seating in home environment and techniques from low surfaces.  Son demonstrates good understanding and safety with information provided.  Patient completed dynavision dynamic balance and reaction time activity - 2 trials in stance, whole board, 2 minutes - trial 1 = 3.43 sec, trial 2 = 3.43 sec.   Upon return to room completed seated and standing balance and LB strengthing/coordination activities, reviewed hamstring stretches.  Patient returned to bed at close of session - bed alarm set and call bell in hand.     PM session:   Patient in bed, alert and ready for therapy session.  Patient completed bed mobility with CS.  Ambulation with RW to/from therapy gym with CGA/CS.  Completed seated activities:  Pipe tree design copy x2 mod I with increased time  after initial instruction.  Nuts and bolts task without difficulty.  Box and blocks coordination assessment: R = 38, L = 33   Ambulated to 2nd therapy gym with RW - completed ergometer x 5 minutes and x 3 minutes in seated position.  Returned to room via RW CGA.  Sitting to supine with CS.  Bed alarm set and call bell in reach at close of session.    Therapy Documentation Precautions:  Precautions Precautions: Fall Precaution Comments: monitor for agitation Restrictions Weight Bearing Restrictions: No  Therapy/Group: Individual Therapy  Carlos Levering 12/01/2019, 7:38 AM

## 2019-12-01 NOTE — Patient Care Conference (Signed)
Inpatient RehabilitationTeam Conference and Plan of Care Update Date: 12/01/2019   Time: 2:22 PM    Patient Name: Dennis Macias      Medical Record Number: [id number]  Date of Birth: [birth year] Sex: Male         Room/Bed: 4M04C/4M04C-01 Payor Info: Payor: HU[employer]NA MEDICARE / Plan: HU[employer] MEDICARE CHOICE PPO / Product Type: *No Product type* /    Admit Date/Time:  11/25/2019  6:02 PM  Primary Diagnosis:  Toxic metabolic encephalopathy  Patient Active Problem List   Diagnosis Date Noted  . Toxic metabolic encephalopathy 94/70/9628  . Anemia of chronic disease   . Benign essential HTN   . AKI (acute kidney injury) ([place])   . Protein-calorie malnutrition, severe 11/19/2019  . Acute ischemic left ICA stroke ([place])   . Bacteremia   . Hypertensive urgency   . Gait disorder   . Encephalopathies   . Chronic combined systolic and diastolic heart failure ([place])   . Non-STEMI (non-ST elevated myocardial infarction) ([place])   . Syncope 11/06/2019  . Acute combined systolic and diastolic congestive heart failure ([place]) 10/21/2019  . Ischemic cardiomyopathy: Probable 10/21/2019  . Pleural effusion transudative   . S/P TURP   . Cardiorenal syndrome   . Dyspnea 10/15/2019  . Elevated TSH 09/29/2019  . Pleural effusion 09/21/2019  . Fatigue 09/17/2019  . Hematuria, unspecified 08/13/2019  . Nephrotic syndrome 08/05/2019  . Protein calorie malnutrition ([place]) 07/31/2019  . Left renal mass 07/21/2019  . CKD (chronic kidney disease) stage 4, GFR 15-29 ml/min (HCC) 04/23/2019  . Hip weakness 08/13/2018  . Pernicious anemia 02/24/2018  . Weight loss, non-intentional 08/26/2017  . 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 Syncope and collapse 02/26/2017  . BPH with urinary obstruction   . Normocytic anemia 12/21/2016  . Neovascular glaucoma due to diabetes mellitus ([place]) 06/07/2016  . History of stroke 09/13/2015  . Hyperlipidemia 09/13/2015  . Diabetes mellitus with retinopathy of both eyes ([place])   .  Essential hypertension     Expected Discharge Date: Expected Discharge Date: 12/04/19  Team Members Present: Physician leading conference: Dr. Courtney Heys Care Coodinator Present: Loralee Pacas, LCSWA;Christina Sampson Goon, BSW;Other (comment)(Dennis Macias Creig Hines, RN, BSN, CRRN) Nurse Present: Dorthula Nettles, RN PT Present: Tereasa Coop, PT OT Present: Elisabeth Most, OT PPS Coordinator present : Gunnar Fusi, Novella Olive, PT     Current Status/Progress Goal Weekly Team Focus  Bowel/Bladder   Patient continent of bowel and bladder  Patient remains continent of bowel and bladder  Assess q shift and prn. Bladder scan and i/o cath if no void in 6 hours.   Swallow/Nutrition/ Hydration             ADL's   UB bathing Supervision, LB bathing CGA, UB dressing CGA, LB dressing Min, toileting CGA, tub bench transfers CGA  Supervision with ADLs  LB bathing/dressing, endurance, functional mobility, standing balance, family education   Mobility   bed mobility mod(I), transfers minA, ambulation CGA with RW, minA with QC ~150'  Supervision  balance, gait training, discharge planning   Communication             Safety/Cognition/ Behavioral Observations            Pain   Denies pain at this time  Pain score 0/10  To maintain as pain free as possbile   Skin   No issues with skin integrity at this time  Maintain skin integrity  Maintain skin integrity    Rehab Goals Patient on target to meet  rehab goals: Yes *See Care Plan and progress notes for long and short-term goals.     Barriers to Discharge  Current Status/Progress Possible Resolutions Date Resolved   Nursing                  PT                    OT                  SLP                Care Coordinator                Discharge Planning/Teaching Needs:  D/c to home with his s/o [facility]. Pt and s/o [name] state their son Quita Skye will be helping, and will work on other families to help assist.  Family education as  recommended by therapy   Team Discussion: Patient still having balance issues. Family education with patient's son has begun and son is aware of balance issues. Otherwise, patient is progressing towards discharge goals.   Revisions to Treatment Plan: n/a     Medical Summary Current Status: no barriers- ate 25% of breakfast- no pain; no skin issues- regular BMs, continent Weekly Focus/Goal: min A LB; S/U UB; fam ed today with son; similar to prior to coming to hospital- balance was better prior- now with RW  Barriers to Discharge: Behavior;Decreased family/caregiver support;Home enviroment access/layout;Medical stability  Barriers to Discharge Comments: Cr is finally improving 4.35- renal on board- to fix barriers- d/c friday Possible Resolutions to Barriers: PT- progressing wel- mod I bed; CGA transfers- walked 150 ft RW CGA- balance issues with RW and cane- posteriorly- poor tolerance/frustration with balance   Continued Need for Acute Rehabilitation Level of Care: The patient requires daily medical management by a physician with specialized training in physical medicine and rehabilitation for the following reasons: Direction of a multidisciplinary physical rehabilitation program to maximize functional independence : Yes Medical management of patient stability for increased activity during participation in an intensive rehabilitation regime.: Yes Analysis of laboratory values and/or radiology reports with any subsequent need for medication adjustment and/or medical intervention. : Yes   I attest that I was present, lead the team conference, and concur with the assessment and plan of the team.   Cristi Loron 12/01/2019, 2:22 PM

## 2019-12-01 NOTE — Progress Notes (Signed)
Patient ID: Dennis Macias, male   DOB: 31-Mar-1951, 69 y.o.   MRN: [id number]   SW met with pt and pt called s/o Dennis Macias while SW in room. SW provided updates from team conference, and d/c date 5/28. S/o Dennis Macias to follow-up SW about family education. SW to order TTB as they do not have item. *Family education on 5/27 10am-12pm with s/o Dennis Macias and son Dennis Macias, MSW, [place] Office: [phone] Cell: [phone] Fax: [phone]

## 2019-12-01 NOTE — Plan of Care (Signed)
  Problem: RH SAFETY Goal: RH STG ADHERE TO SAFETY PRECAUTIONS W/ASSISTANCE/DEVICE Description: STG Adhere to Safety Precautions With supervision   Outcome: Progressing   Problem: RH COGNITION-NURSING Goal: RH STG USES MEMORY AIDS/STRATEGIES W/ASSIST TO PROBLEM SOLVE Description: STG Uses Memory Aids/Strategies With supervision to Problem Solve. Outcome: Progressing

## 2019-12-01 NOTE — Progress Notes (Signed)
[facility] Progress Note   Subjective:   No new complaints. I/Os 450 / 600 yesterday with BID lasix. Wt essentially unchanged c/w admission 5/19.    Objective Vitals:   11/30/19 1644 11/30/19 2031 12/01/19 0409 12/01/19 0500  BP: (!) 144/64 (!) 135/58 (!) 151/70   Pulse: 65 60 63   Resp: 20 18 17    Temp: 97.6 F (36.4 C) 97.8 F (36.6 C) 98.2 F (36.8 C)   TempSrc:  Oral Oral   SpO2: 97% 100% 99%   Weight:    79.6 kg  Height:       Physical Exam General: lying flat in bed on RA Heart: RRR, no rub Lungs: normal WOB, clear ant Abdomen: soft, nontender Extremities: 1+ LE edema Neuro:  AOx3, seems unclear on details  Additional Objective Labs: Basic Metabolic Panel: Recent Labs  Lab 11/29/19 0236 11/30/19 0932 12/01/19 0707  NA 136 136 136  K 5.6* 4.2 4.3  CL 102 101 102  CO2 27 26 25   GLUCOSE 125* 179* 157*  BUN 87* 89* 95*  CREATININE 4.50* 4.38* 4.35*  CALCIUM 8.6* 8.7* 8.7*  PHOS 3.2 3.9 4.3   Liver Function Tests: Recent Labs  Lab 11/25/19 1819 11/26/19 0946 11/29/19 0236 11/30/19 0932 12/01/19 0707  AST 13*  --   --   --   --   ALT 11  --   --   --   --   ALKPHOS 47  --   --   --   --   BILITOT 0.4  --   --   --   --   PROT 4.8*  --   --   --   --   ALBUMIN 2.0*   < > 2.1* 2.1* 2.1*   < > = values in this interval not displayed.   No results for input(s): LIPASE, AMYLASE in the last 168 hours. CBC: Recent Labs  Lab 11/26/19 0946 11/30/19 0932  WBC 5.4 4.6  NEUTROABS 3.8  --   HGB 8.5* 8.6*  HCT 26.7* 27.1*  MCV 94.0 94.4  PLT 200 212   Blood Culture    Component Value Date/Time   SDES URINE, RANDOM 11/13/2019 2212   SPECREQUEST  11/13/2019 2212    NONE Performed at [facility], [place] [address] [place], [place] [address]    CULT MULTIPLE SPECIES PRESENT, SUGGEST RECOLLECTION (A) 11/13/2019 2212   REPTSTATUS 11/15/2019 FINAL 11/13/2019 2212    Cardiac Enzymes: No results for input(s): CKTOTAL, CKMB,  CKMBINDEX, TROPONINI in the last 168 hours. CBG: Recent Labs  Lab 11/30/19 1215 11/30/19 1647 11/30/19 2120 12/01/19 0619 12/01/19 1127  GLUCAP 167* 212* 166* 175* 139*   Iron Studies: No results for input(s): IRON, TIBC, TRANSFERRIN, FERRITIN in the last 72 hours. @lablastinr3 @ Studies/Results: US RENAL  Result Date: 11/29/2019 CLINICAL DATA:  Acute renal insufficiency. EXAM: RENAL / URINARY TRACT ULTRASOUND COMPLETE COMPARISON:  July 14, 2019 FINDINGS: Right Kidney: Renal measurements: 11.0 x 4.6 x 4.7 cm = volume: 125 mL . Echogenicity within normal limits. No mass or hydronephrosis visualized. Tiny amount of perinephric fluid. Left Kidney: Renal measurements: 11.1 x 5.1 x 5.2 cm = volume: 154 mL. Echogenicity within normal limits. No mass or hydronephrosis visualized. Lower pole echogenic lesion and nonobstructive midpolar calculus are not seen on today's exam. Tiny amount of perinephric fluid. Bladder: Decompressed. Other: Bilateral pleural effusions. Small volume abdominal ascites. IMPRESSION: Normal appearance of the kidneys apart from tiny amount of perinephric fluid  bilaterally. Bilateral pleural effusions. Small volume abdominal ascites. Electronically Signed   By: Fidela Salisbury M.D.   On: 11/29/2019 19:11   DG CHEST PORT 1 VIEW  Result Date: 11/29/2019 CLINICAL DATA:  Chest pain and shortness of breath. EXAM: PORTABLE CHEST 1 VIEW COMPARISON:  Nov 07, 2019 FINDINGS: Enlarged cardiac silhouette. Calcific atherosclerotic disease of the aorta. Mediastinal contours appear intact. Bilateral layering pleural effusions. Pulmonary vascular congestion. Osseous structures are without acute abnormality. Soft tissues are grossly normal. IMPRESSION: 1. Bilateral layering pleural effusions with pulmonary vascular congestion. 2. Calcific atherosclerotic disease of the aorta. 3. Enlarged cardiac silhouette. Electronically Signed   By: Fidela Salisbury M.D.   On: 11/29/2019 18:19    Medications:  . amLODipine  5 mg Oral Daily  . aspirin  81 mg Oral Daily  . atorvastatin  80 mg Oral QHS  . carvedilol  25 mg Oral BID WC  . clopidogrel  75 mg Oral Daily  . darbepoetin (ARANESP) injection - NON-DIALYSIS  100 mcg Subcutaneous Q Tue-1800  . dorzolamide-timolol  1 drop Right Eye BID  . feeding supplement (ENSURE ENLIVE)  237 mL Oral TID BM  . feeding supplement (PRO-STAT SUGAR FREE 64)  30 mL Oral BID  . ferrous gluconate  324 mg Oral Q breakfast  . finasteride  5 mg Oral Daily  . [START ON 12/02/2019] furosemide  80 mg Intravenous Daily  . heparin  5,000 Units Subcutaneous Q8H  . hydrocerin   Topical BID  . insulin aspart  0-5 Units Subcutaneous QHS  . insulin aspart  0-6 Units Subcutaneous TID WC  . isosorbide mononitrate  60 mg Oral Daily  . mouth rinse  15 mL Mouth Rinse BID  . melatonin  3 mg Oral QHS  . tamsulosin  0.4 mg Oral QPC supper  . vitamin B-12  1,000 mcg Oral Daily    Assessment/ Plan: 1. AoCKD 4 - baseline creat 3.5-4 (followed by Dr. Hollie Salk at [facility]), up to 4.5 5/23 and now 4.35. Had contrast but that was 10 days ago and low vol 20 cc. Suspect this is cardiorenal, pt has diffuse LE, hip and flank edema.  Renal US without obstruction.  Check bladder scan qShift with h/o retention.  FeNa c/w intrinsic issue (though with diuretic on board). Cont IV lasix 80 BID but not making much progress so may need to increase tomorrow. Daily labs.  2. Hyperkalemia - resolved 3. Acute + chronic syst CHF/ vol excess - EF 40-45%, no resp issues, mostly peripheral edema though 5/23 CXR with pleural effusions and pulmonary edema.  4. HTN - on 3 bp meds, have dc'd hydralazine in anticipation of diuresis - BP fine this AM.  5. CAD - sp LHC 5/13, LAD disease 6. DM2 7. Blindness 8. Recent TURP - April 2021  9. Anemia:  Hb mid 8s. Iron sats ok.  Give aranesp 100 12/01/19 q2wks  [name] 12/01/2019, 1:12 PM  [facility] Pager: [phone]

## 2019-12-01 NOTE — Progress Notes (Signed)
[place] PHYSICAL MEDICINE & REHABILITATION PROGRESS NOTE  Subjective/Complaints:  Pt reports doing well- has no coplaints- sleeping better- no issues- ate 25% of breakfast.   ROS:  Pt denies SOB, abd pain, CP, N/V/C/D, and vision changes    Objective:   US RENAL  Result Date: 11/29/2019 CLINICAL DATA:  Acute renal insufficiency. EXAM: RENAL / URINARY TRACT ULTRASOUND COMPLETE COMPARISON:  July 14, 2019 FINDINGS: Right Kidney: Renal measurements: 11.0 x 4.6 x 4.7 cm = volume: 125 mL . Echogenicity within normal limits. No mass or hydronephrosis visualized. Tiny amount of perinephric fluid. Left Kidney: Renal measurements: 11.1 x 5.1 x 5.2 cm = volume: 154 mL. Echogenicity within normal limits. No mass or hydronephrosis visualized. Lower pole echogenic lesion and nonobstructive midpolar calculus are not seen on today's exam. Tiny amount of perinephric fluid. Bladder: Decompressed. Other: Bilateral pleural effusions. Small volume abdominal ascites. IMPRESSION: Normal appearance of the kidneys apart from tiny amount of perinephric fluid bilaterally. Bilateral pleural effusions. Small volume abdominal ascites. Electronically Signed   By: Fidela Salisbury M.D.   On: 11/29/2019 19:11   DG CHEST PORT 1 VIEW  Result Date: 11/29/2019 CLINICAL DATA:  Chest pain and shortness of breath. EXAM: PORTABLE CHEST 1 VIEW COMPARISON:  Nov 07, 2019 FINDINGS: Enlarged cardiac silhouette. Calcific atherosclerotic disease of the aorta. Mediastinal contours appear intact. Bilateral layering pleural effusions. Pulmonary vascular congestion. Osseous structures are without acute abnormality. Soft tissues are grossly normal. IMPRESSION: 1. Bilateral layering pleural effusions with pulmonary vascular congestion. 2. Calcific atherosclerotic disease of the aorta. 3. Enlarged cardiac silhouette. Electronically Signed   By: Fidela Salisbury M.D.   On: 11/29/2019 18:19   Recent Labs    11/30/19 0932  WBC 4.6  HGB  8.6*  HCT 27.1*  PLT 212   Recent Labs    11/30/19 0932 12/01/19 0707  NA 136 136  K 4.2 4.3  CL 101 102  CO2 26 25  GLUCOSE 179* 157*  BUN 89* 95*  CREATININE 4.38* 4.35*  CALCIUM 8.7* 8.7*    Intake/Output Summary (Last 24 hours) at 12/01/2019 0925 Last data filed at 12/01/2019 0622 Gross per 24 hour  Intake 459 ml  Output 600 ml  Net -141 ml     Physical Exam: Vital Signs Blood pressure (!) 151/70, pulse 63, temperature 98.2 F (36.8 C), temperature source Oral, resp. rate 17, height 6' (1.829 m), weight 79.6 kg, SpO2 99 %.  Physical Exam  Nursing note and vitals reviewed. Constitutional: pt sitting up eating/picking at breakfast, NAD  HENT: blind R eye- non reactive pupil  CV:  RRR Respiratory: CTA B/L-  GI: Soft, NT, ND, (+)BS     Musculoskeletal:     Comments: swelling in LEs almost resolved Neurological: no change in slowed processing- focusing on TV the whole time.   Also HOH   He lacks insight/awareness of deficits.  Motor: 4+/5 throughout  Skin: Skin is warm and dry. IVs in both arms Stasis changes bilateral shins.   Psychiatric:flat affect Assessment/Plan: 1. Functional deficits secondary to toxic metabolic encephalopathy which require 3+ hours per day of interdisciplinary therapy in a comprehensive inpatient rehab setting.  Physiatrist is providing close team supervision and 24 hour management of active medical problems listed below.  Physiatrist and rehab team continue to assess barriers to discharge/monitor patient progress toward functional and medical goals  Care Tool:  Bathing    Body parts bathed by patient: Right arm, Left arm, Chest, Abdomen, Front perineal area, Buttocks, Right upper  leg, Left upper leg, Face, Right lower leg, Left lower leg         Bathing assist Assist Level: Contact Guard/Touching assist     Upper Body Dressing/Undressing Upper body dressing Upper body dressing/undressing activity did not occur (including  orthotics): N/A What is the patient wearing?: Pull over shirt    Upper body assist Assist Level: Contact Guard/Touching assist    Lower Body Dressing/Undressing Lower body dressing      What is the patient wearing?: Incontinence brief, Pants     Lower body assist Assist for lower body dressing: Minimal Assistance - Patient > 75%     Toileting Toileting    Toileting assist Assist for toileting: Contact Guard/Touching assist     Transfers Chair/bed transfer  Transfers assist     Chair/bed transfer assist level: Contact Guard/Touching assist     Locomotion Ambulation   Ambulation assist      Assist level: Minimal Assistance - Patient > 75% Assistive device: Cane-quad Max distance: 100'   Walk 10 feet activity   Assist     Assist level: Minimal Assistance - Patient > 75% Assistive device: Walker-rolling   Walk 50 feet activity   Assist    Assist level: Minimal Assistance - Patient > 75% Assistive device: Cane-quad    Walk 150 feet activity   Assist    Assist level: Contact Guard/Touching assist Assistive device: Walker-rolling    Walk 10 feet on uneven surface  activity   Assist     Assist level: Minimal Assistance - Patient > 75% Assistive device: Hand held assist   Wheelchair     Assist Will patient use wheelchair at discharge?: No             Wheelchair 50 feet with 2 turns activity    Assist            Wheelchair 150 feet activity     Assist          Blood pressure (!) 151/70, pulse 63, temperature 98.2 F (36.8 C), temperature source Oral, resp. rate 17, height 6' (1.829 m), weight 79.6 kg, SpO2 99 %.  Medical Problem List and Plan: 1.  Deficits with mobility, transfers, self-care secondary to debility with metabolic encephalopathy.             -patient may shower             -ELOS/Goals: 7-10 days/Supervision/Mod I             Continue CIR 2.  Antithrombotics: -DVT/anticoagulation:   Pharmaceutical: Heparin             -antiplatelet therapy: DAPT 3. Pain Management: Tylenol prn. Well controlled 4. Mood: LCSW to follow for evaluation and support.              -antipsychotic agents: N/A 5. Neuropsych: This patient is not fully capable of making decisions on his own behalf. 6. Skin/Wound Care: Routine pressure relief measures.  7. Fluids/Electrolytes/Nutrition: Monitor I/Os. Monitor for signs of overload- check daily weights.  Monitor renal status for recovery.    Filed Weights   11/29/19 0310 11/30/19 0436 12/01/19 0500  Weight: 79.6 kg 79.5 kg 79.6 kg   5/21- no weight for today, but yesterday weight was down 2 kg- will monitor  5/22- Weight back to 79 kg- will monitor to make sure not retaining a lot of fluid.   5/23- Pt's weight 79.6- stable from yesterday  5/25- stable weight 8. Cefipime induced encephalopathy: Improving 9.  Acute on chronic renal failure: Followed by Dr. Hollie Salk. SCr-40.9 today.               CMP ordered for tomorrow.   5/20- Cr stable at 4.09- was 4.07- BUN worse at 66- was 54. Renal signed off- will recheck this weekend.   5/21- Cr came back late yesterday 4.16 and now 4.27- is still rising- if hits 4.5, will call renal back  5/22- Cr up to 4.39- still rising- if increasing to 4.5- will call renal back to see if can help. Also hyperkalemic  5/23- Cr still rising 4.50 and BUN up to 87- concerned since not stopping the rise- called renal consult and spoke to Dr. Jonnie Finner -they will see pt.   5/25- Cr down to 4.36 (4.36 yesterday)- better s/p diuresis 10 HTN: Monitor BP tid--on Norvasc, Imdur, Hydralazine and Coreg.  5/20- BP well controlled- 120s/60s   5/23- BP slightly elevated but overall stable- con't meds             Monitor with increased mobility  5/25- BP slightly elevated 150s/70s- con't regimen for now 11. NSTEMI/ICM: Has significant LAD --to be treated medically with Coreg, Hydralazine, Imdur, ASA/Plavix and Lipitor. PCI if patient  progresses to ESRD.            5/22- monitoring for Sx's 12. Pseudomonas UTI: Completed IV antibiotics on 05/08.  13. BPH s/p TURP 4/13: Continue Proscar. Condom cath in place with cloudy urine in tubing. Will check PVRs to monitor voiding. Toilet every 4 hours during the day.    5/20- PVRs show very low post void residuals             Monitor for retention 14. Anemia of chronic disease: On Aranesp--increased to 127mcg/weekly today.              CBC ordered for tomorrow  5/20- Hb 7.9- aranesp just increased due to dwindling Hb.  15. Hyperkalemia  5/21- will order Lokelma 5G daily since K+ rising - was 5.0, then 5.4, then now 5.6-   5/22-will Increase lokelma to 10g and added a dose of Kayexylate to get it down faster   5/23- K= only down to 5.6 after 5 BMs yesterday- have called renal for assistance.  5/25- K+ down to 4.3- doing much better 16. DM- A1c 8.5-   5/21- will start SSI and BG checks- will d/w pharmacy about meds considering rising renal function.   CBG (last 3)  Recent Labs    11/30/19 1647 11/30/19 2120 12/01/19 0619  GLUCAP 212* 166* 175*    5/22- BGs vAriable- but cannot add Oral agent due to rising Cr- will monitor for another day and add insulin dosing based on BGs.   5/23- BGs overall look good, actually- will con't to monitor.   5/24: CBGs little elevated last night, well controlled today.  5/25- BGs better- [id number]- con't regimen   Continue to monitor.  17. Anemia: Hgb 8.6; stable.  18. Insomnia: started Melatonin.   LOS: 6 days A FACE TO FACE EVALUATION WAS PERFORMED  Megan Lovorn 12/01/2019, 9:25 AM

## 2019-12-02 ENCOUNTER — Inpatient Hospital Stay (HOSPITAL_COMMUNITY): Payer: Medicare PPO | Admitting: Occupational Therapy

## 2019-12-02 ENCOUNTER — Inpatient Hospital Stay (HOSPITAL_COMMUNITY): Payer: Medicare PPO

## 2019-12-02 LAB — RENAL FUNCTION PANEL
Albumin: 2.1 g/dL — ABNORMAL LOW (ref 3.5–5.0)
Anion gap: 11 (ref 5–15)
BUN: 96 mg/dL — ABNORMAL HIGH (ref 8–23)
CO2: 25 mmol/L (ref 22–32)
Calcium: 8.7 mg/dL — ABNORMAL LOW (ref 8.9–10.3)
Chloride: 100 mmol/L (ref 98–111)
Creatinine, Ser: 4.33 mg/dL — ABNORMAL HIGH (ref 0.61–1.24)
GFR calc Af Amer: 15 mL/min — ABNORMAL LOW (ref >60)
GFR calc non Af Amer: 13 mL/min — ABNORMAL LOW (ref >60)
Glucose, Bld: 122 mg/dL — ABNORMAL HIGH (ref 70–99)
Phosphorus: 4.3 mg/dL (ref 2.5–4.6)
Potassium: 4.2 mmol/L (ref 3.5–5.1)
Sodium: 136 mmol/L (ref 135–145)

## 2019-12-02 LAB — GLUCOSE, CAPILLARY
Glucose-Capillary: 133 mg/dL — ABNORMAL HIGH (ref 70–99)
Glucose-Capillary: 220 mg/dL — ABNORMAL HIGH (ref 70–99)
Glucose-Capillary: 128 mg/dL — ABNORMAL HIGH (ref 70–99)
Glucose-Capillary: 158 mg/dL — ABNORMAL HIGH (ref 70–99)

## 2019-12-02 MED ORDER — FUROSEMIDE 40 MG PO TABS
80.0000 mg | ORAL_TABLET | Freq: Every day | ORAL | Status: DC
  Administered 2019-12-03 – 2019-12-04 (×2): 80 mg via ORAL
  Filled 2019-12-02 (×2): qty 2

## 2019-12-02 NOTE — Progress Notes (Signed)
Physical Therapy Session Note  Patient Details  Name: Dennis Macias MRN: [id number] Date of Birth: 1951-06-09  Today's Date: 12/02/2019 PT Individual Time: 5631-4970 PT Individual Time Calculation (min): 46 min   Short Term Goals: Week 1:  PT Short Term Goal 1 (Week 1): = to LTGs based on ELOS  Skilled Therapeutic Interventions/Progress Updates:     Pt received supine in bed and agreeable to therapy. No report of pain. Supine to sit mod(I) with bed features. PT provides minA to thread shorts on BLEs. Pt stands with CGA and able to pull shorts up from knees. Stand step to [facility] with CGA and no AD. WC transport to gym for energy conservation. Pt performs stair training to prep for DC. First bout pt performs with BHRs x8 steps with CGA and step-to technique. 2nd bout pt performs x8 steps with LHR only and requires minA with x1 LOB. Extended seated rest break. Pt performs x2 additional bouts of x8 steps with LHR and quad cane in RUE, requiring CGA.   WC transport to dayroom. Pt performs therex on Nustep for endurance training and reciprocal coordination training. Pt completes 10:00 minutes at workload of 3. Pt visibly fatigued following Nustep activity and performs squat pivot to WC with decreased control and safety relative to previous transfers. WC transport back to room. Stand step transfer from [facility] to bed with CGA and return to supine mod(I). Pt left supine in bed with alarm intact and all needs within reach.  Therapy Documentation Precautions:  Precautions Precautions: Fall Precaution Comments: monitor for agitation Restrictions Weight Bearing Restrictions: No    Therapy/Group: Individual Therapy  Breck Coons, PT, DPT 12/02/2019, 12:13 PM

## 2019-12-02 NOTE — Progress Notes (Signed)
Occupational Therapy Session Note  Patient Details  Name: Dennis Macias MRN: [id number] Date of Birth: 08-10-1950  Today's Date: 12/02/2019 OT Individual Time: 4481-8563 OT Individual Time Calculation (min): 42 min    Skilled Therapeutic Interventions/Progress Updates:  Patient met lying supine in bed in agreement with OT treatment session with focus on household mobility with device, functional transfers, and self-care re-education as detailed below. Patient in agreement with shower in ADL apartment in prep for safe d/c home but states "I'm not gone be running all around doing too much". Functional mobility to and from ADL apartment with use of RW and CGA. Tub/shower transfer with use of TTB and CGA and cues for sequencing and hand placement. Patient completed UB/LB bathing/dressing with CGA- supervision A. UB dressing seated on TTB with supervision A and LB dressing in sitting/standing with CGA for steadying in standing with use of RW. Patient continues to require coaxing/encouragement for participation and demonstrates low frustration tolerance with occasional outbursts. Patient declined all OOB/EOB/bed level activity after shower. Patient education on benefit from participation with therapy in prep for safe d/c to increase independence and decrease need for assistance. Session concluded with patient lying supine in bed with call bell within reach, bed alarm activated, and all needs met.   Therapy Documentation Precautions:  Precautions Precautions: Fall Precaution Comments: monitor for agitation Restrictions Weight Bearing Restrictions: No General:    Therapy/Group: Individual Therapy  Yoshimi Sarr R Howerton-Davis 12/02/2019, 1:47 PM

## 2019-12-02 NOTE — Plan of Care (Signed)
  Problem: RH SAFETY Goal: RH STG ADHERE TO SAFETY PRECAUTIONS W/ASSISTANCE/DEVICE Description: STG Adhere to Safety Precautions With supervision   Outcome: Progressing   Problem: RH BLADDER ELIMINATION Goal: RH STG MANAGE BLADDER WITH ASSISTANCE Description: STG Manage Bladder With min assist   Outcome: Progressing

## 2019-12-02 NOTE — Progress Notes (Signed)
[facility] PHYSICAL MEDICINE & REHABILITATION PROGRESS NOTE  Subjective/Complaints:  Pt reports slept fairly well with melatonin- had a shot in his abdomen- was bleeding so got pressure dressing placed on it- doing OK.  Otherwise, no issues.   ROS:  Pt denies SOB, abd pain, CP, N/V/C/D, and vision changes   Objective:   No results found. Recent Labs    11/30/19 0932  WBC 4.6  HGB 8.6*  HCT 27.1*  PLT 212   Recent Labs    12/01/19 0707 12/02/19 0533  NA 136 136  K 4.3 4.2  CL 102 100  CO2 25 25  GLUCOSE 157* 122*  BUN 95* 96*  CREATININE 4.35* 4.33*  CALCIUM 8.7* 8.7*    Intake/Output Summary (Last 24 hours) at 12/02/2019 0935 Last data filed at 12/02/2019 0745 Gross per 24 hour  Intake 747 ml  Output 850 ml  Net -103 ml     Physical Exam: Vital Signs Blood pressure (!) 127/57, pulse 66, temperature 98.8 F (37.1 C), temperature source Oral, resp. rate 16, height 6' (1.829 m), weight 75.6 kg, SpO2 97 %.  Physical Exam  Nursing note and vitals reviewed. Constitutional: pt sitting up in bed; watching sports on tv, NAD HENT: blind R eye- non reactive pupil  CV:  RRR Respiratory: CTA B/L- no W/R/R- good air movement GI: Soft, NT, ND, (+)BS      Musculoskeletal:     Comments: swelling in LEs almost resolved Neurological: no change in slowed processing- focusing on TV the whole time.   Also HOH   Lacks insight into deficits- focused on d/c day  Motor: 4+/5 throughout  Skin: Skin is warm and dry. IVs in both arms Stasis changes bilateral shins.   Psychiatric:flat affect, but cordial   Assessment/Plan: 1. Functional deficits secondary to toxic metabolic encephalopathy which require 3+ hours per day of interdisciplinary therapy in a comprehensive inpatient rehab setting.  Physiatrist is providing close team supervision and 24 hour management of active medical problems listed below.  Physiatrist and rehab team continue to assess barriers to  discharge/monitor patient progress toward functional and medical goals  Care Tool:  Bathing    Body parts bathed by patient: Right arm, Left arm, Chest, Abdomen, Front perineal area, Buttocks, Right upper leg, Left upper leg, Face, Right lower leg, Left lower leg         Bathing assist Assist Level: Contact Guard/Touching assist     Upper Body Dressing/Undressing Upper body dressing Upper body dressing/undressing activity did not occur (including orthotics): N/A What is the patient wearing?: Pull over shirt    Upper body assist Assist Level: Contact Guard/Touching assist    Lower Body Dressing/Undressing Lower body dressing      What is the patient wearing?: Incontinence brief, Pants     Lower body assist Assist for lower body dressing: Minimal Assistance - Patient > 75%     Toileting Toileting    Toileting assist Assist for toileting: Contact Guard/Touching assist     Transfers Chair/bed transfer  Transfers assist     Chair/bed transfer assist level: Contact Guard/Touching assist     Locomotion Ambulation   Ambulation assist      Assist level: Minimal Assistance - Patient > 75% Assistive device: Walker-rolling Max distance: 120'   Walk 10 feet activity   Assist     Assist level: Contact Guard/Touching assist Assistive device: Walker-rolling   Walk 50 feet activity   Assist    Assist level: Contact Guard/Touching assist Assistive  device: Walker-rolling    Walk 150 feet activity   Assist    Assist level: Contact Guard/Touching assist Assistive device: Walker-rolling    Walk 10 feet on uneven surface  activity   Assist     Assist level: Minimal Assistance - Patient > 75% Assistive device: Hand held assist   Wheelchair     Assist Will patient use wheelchair at discharge?: No             Wheelchair 50 feet with 2 turns activity    Assist            Wheelchair 150 feet activity     Assist           Blood pressure (!) 127/57, pulse 66, temperature 98.8 F (37.1 C), temperature source Oral, resp. rate 16, height 6' (1.829 m), weight 75.6 kg, SpO2 97 %.  Medical Problem List and Plan: 1.  Deficits with mobility, transfers, self-care secondary to debility with metabolic encephalopathy.             -patient may shower             -ELOS/Goals: 7-10 days/Supervision/Mod I             Continue CIR 2.  Antithrombotics: -DVT/anticoagulation:  Pharmaceutical: Heparin             -antiplatelet therapy: DAPT 3. Pain Management: Tylenol prn. Well controlled 4. Mood: LCSW to follow for evaluation and support.              -antipsychotic agents: N/A 5. Neuropsych: This patient is not fully capable of making decisions on his own behalf. 6. Skin/Wound Care: Routine pressure relief measures.  7. Fluids/Electrolytes/Nutrition: Monitor I/Os. Monitor for signs of overload- check daily weights.  Monitor renal status for recovery.    Filed Weights   11/30/19 0436 12/01/19 0500 12/02/19 0406  Weight: 79.5 kg 79.6 kg 75.6 kg   5/21- no weight for today, but yesterday weight was down 2 kg- will monitor  5/22- Weight back to 79 kg- will monitor to make sure not retaining a lot of fluid.   5/23- Pt's weight 79.6- stable from yesterday  5/25- stable weight  5/26- weight down 4 Kg- not sure if correct? 8. Cefipime induced encephalopathy: Improving 9. Acute on chronic renal failure: Followed by Dr. Hollie Salk. SCr-40.9 today.               CMP ordered for tomorrow.   5/20- Cr stable at 4.09- was 4.07- BUN worse at 66- was 54. Renal signed off- will recheck this weekend.   5/21- Cr came back late yesterday 4.16 and now 4.27- is still rising- if hits 4.5, will call renal back  5/22- Cr up to 4.39- still rising- if increasing to 4.5- will call renal back to see if can help. Also hyperkalemic  5/23- Cr still rising 4.50 and BUN up to 87- concerned since not stopping the rise- called renal consult and spoke to Dr.  Jonnie Finner -they will see pt.   5/25- Cr down to 4.35 (4.36 yesterday)- better s/p diuresis  5/26- Cr down to 4.33 - barely moved- renal said might increase Lasix.  10 HTN: Monitor BP tid--on Norvasc, Imdur, Hydralazine and Coreg.  5/20- BP well controlled- 120s/60s   5/23- BP slightly elevated but overall stable- con't meds             Monitor with increased mobility  5/25- BP slightly elevated 150s/70s- con't regimen for now 11. NSTEMI/ICM:  Has significant LAD --to be treated medically with Coreg, Hydralazine, Imdur, ASA/Plavix and Lipitor. PCI if patient progresses to ESRD.            5/22- monitoring for Sx's 12. Pseudomonas UTI: Completed IV antibiotics on 05/08.  13. BPH s/p TURP 4/13: Continue Proscar. Condom cath in place with cloudy urine in tubing. Will check PVRs to monitor voiding. Toilet every 4 hours during the day.    5/20- PVRs show very low post void residuals  5/26- CBC tomorrow             Monitor for retention 14. Anemia of chronic disease: On Aranesp--increased to 190mcg/weekly today.              CBC ordered for tomorrow  5/20- Hb 7.9- aranesp just increased due to dwindling Hb.  15. Hyperkalemia  5/21- will order Lokelma 5G daily since K+ rising - was 5.0, then 5.4, then now 5.6-   5/22-will Increase lokelma to 10g and added a dose of Kayexylate to get it down faster   5/23- K= only down to 5.6 after 5 BMs yesterday- have called renal for assistance.  5/25- K+ down to 4.3- doing much better  5/26- K+ 4.2 16. DM- A1c 8.5-   5/21- will start SSI and BG checks- will d/w pharmacy about meds considering rising renal function.   CBG (last 3)  Recent Labs    12/01/19 1645 12/01/19 2057 12/02/19 0632  GLUCAP 206* 181* 133*    5/22- BGs vAriable- but cannot add Oral agent due to rising Cr- will monitor for another day and add insulin dosing based on BGs.   5/23- BGs overall look good, actually- will con't to monitor.   5/24: CBGs little elevated last night, well  controlled today.  5/25- BGs better- 216-244- con't regimen  5/26- BGs 133- 206- slightly better   Continue to monitor.  17. Anemia: Hgb 8.6; stable.  18. Insomnia: started Melatonin.  5/26- sleeping better- con't meds   LOS: 7 days A FACE TO FACE EVALUATION WAS PERFORMED  Megan Lovorn 12/02/2019, 9:35 AM

## 2019-12-02 NOTE — Progress Notes (Signed)
[facility] Progress Note   Subjective:   No new complaints. I/Os 750 / 850 yesterday with BID lasix. Wt this AM 75.6 from 79.6kg yesterday. He has no complaints today - no orthopnea, dyspnea; seems to think his edema is chronic and unchanged. Says d/c set for Friday.    Objective Vitals:   12/01/19 0500 12/01/19 1556 12/01/19 1942 12/02/19 0406  BP:  (!) 159/74 (!) 163/69 (!) 127/57  Pulse:  69 68 66  Resp:  17 16 16   Temp:  98.4 F (36.9 C) 98.1 F (36.7 C) 98.8 F (37.1 C)  TempSrc:   Oral Oral  SpO2:  95% 97% 97%  Weight: 79.6 kg   75.6 kg  Height:       Physical Exam General: lying flat in bed on RA Heart: RRR, no rub Lungs: normal WOB, clear ant Abdomen: soft, nontender Extremities: 1+ LE edema  Neuro:  AOx3, seems unclear on details  Additional Objective Labs: Basic Metabolic Panel: Recent Labs  Lab 11/30/19 0932 12/01/19 0707 12/02/19 0533  NA 136 136 136  K 4.2 4.3 4.2  CL 101 102 100  CO2 26 25 25   GLUCOSE 179* 157* 122*  BUN 89* 95* 96*  CREATININE 4.38* 4.35* 4.33*  CALCIUM 8.7* 8.7* 8.7*  PHOS 3.9 4.3 4.3   Liver Function Tests: Recent Labs  Lab 11/25/19 1819 11/26/19 0946 11/30/19 0932 12/01/19 0707 12/02/19 0533  AST 13*  --   --   --   --   ALT 11  --   --   --   --   ALKPHOS 47  --   --   --   --   BILITOT 0.4  --   --   --   --   PROT 4.8*  --   --   --   --   ALBUMIN 2.0*   < > 2.1* 2.1* 2.1*   < > = values in this interval not displayed.   No results for input(s): LIPASE, AMYLASE in the last 168 hours. CBC: Recent Labs  Lab 11/26/19 0946 11/30/19 0932  WBC 5.4 4.6  NEUTROABS 3.8  --   HGB 8.5* 8.6*  HCT 26.7* 27.1*  MCV 94.0 94.4  PLT 200 212   Blood Culture    Component Value Date/Time   SDES URINE, RANDOM 11/13/2019 2212   SPECREQUEST  11/13/2019 2212    NONE Performed at [facility], [place] [address], [place], [place] [id number]    CULT MULTIPLE SPECIES PRESENT, SUGGEST RECOLLECTION (A)  11/13/2019 2212   REPTSTATUS 11/15/2019 FINAL 11/13/2019 2212    Cardiac Enzymes: No results for input(s): CKTOTAL, CKMB, CKMBINDEX, TROPONINI in the last 168 hours. CBG: Recent Labs  Lab 12/01/19 0619 12/01/19 1127 12/01/19 1645 12/01/19 2057 12/02/19 0632  GLUCAP 175* 139* 206* 181* 133*   Iron Studies: No results for input(s): IRON, TIBC, TRANSFERRIN, FERRITIN in the last 72 hours. @lablastinr3 @ Studies/Results: No results found. Medications:  . amLODipine  5 mg Oral Daily  . aspirin  81 mg Oral Daily  . atorvastatin  80 mg Oral QHS  . carvedilol  25 mg Oral BID WC  . clopidogrel  75 mg Oral Daily  . darbepoetin (ARANESP) injection - NON-DIALYSIS  100 mcg Subcutaneous Q Tue-1800  . dorzolamide-timolol  1 drop Right Eye BID  . feeding supplement (ENSURE ENLIVE)  237 mL Oral TID BM  . feeding supplement (PRO-STAT SUGAR FREE 64)  30 mL Oral BID  .  ferrous gluconate  324 mg Oral Q breakfast  . finasteride  5 mg Oral Daily  . furosemide  80 mg Intravenous BID  . heparin  5,000 Units Subcutaneous Q8H  . hydrocerin   Topical BID  . insulin aspart  0-5 Units Subcutaneous QHS  . insulin aspart  0-6 Units Subcutaneous TID WC  . isosorbide mononitrate  60 mg Oral Daily  . mouth rinse  15 mL Mouth Rinse BID  . melatonin  3 mg Oral QHS  . tamsulosin  0.4 mg Oral QPC supper  . vitamin B-12  1,000 mcg Oral Daily    Assessment/ Plan: 1. AoCKD 4 - baseline creat 3.5-4 (followed by Dr. Hollie Salk at [facility]), up to 4.5 5/23 and now hovering in the 4.3 range. Had contrast but that was 10 days ago and low vol 20 cc. Suspect this is cardiorenal, pt has diffuse LE, hip and flank edema.  Renal US without obstruction.  Check bladder scan qShift with h/o retention.  FeNa c/w intrinsic issue.  In light of planned discharge on Friday will switch to po diuretics in prep and in light of climbing BUN will hold PM dose lasix today and start po tomorrow (lasix 80 PO) 2. Hyperkalemia - resolved 3. Acute +  chronic syst CHF/ vol excess - EF 40-45%, no resp issues, mostly peripheral edema though 5/23 CXR with pleural effusions and pulmonary edema.  4. HTN - on 3 bp meds, have dc'd hydralazine in anticipation of diuresis - BP fine this AM.  5. CAD - sp LHC 5/13, LAD disease 6. DM2 7. Blindness 8. Recent TURP - April 2021  9. Anemia:  Hb mid 8s. Iron sats ok.  Give aranesp 100 12/01/19 q2wks 10. Secondary hyperPTH: 09/2019 PTH 145, phos and corr ca ok here.   [name] 12/02/2019, 10:14 AM  [facility] Pager: [phone]

## 2019-12-02 NOTE — Progress Notes (Signed)
Occupational Therapy Session Note  Patient Details  Name: Dennis Macias MRN: [id number] Date of Birth: 16-Oct-1950  Today's Date: 12/02/2019 OT Individual Time: 6269-4854 OT Individual Time Calculation (min): 54 min    Short Term Goals: Week 1:  OT Short Term Goal 1 (Week 1): stg=ltg d/t elos  Skilled Therapeutic Interventions/Progress Updates:    Upon entering the room, pt supine in bed and sleeping soundly. Pt reluctantly agreeable to OT intervention. Pt declined toileting and self care tasks. Pt was agreeable to go outside. Pt discussed home set up and  Energy conservation. Pt very excited to be outside and reports every day he sits outside on his porch with family. Pt ambulating with min guard without use of AD 30' on uneven surface with increased time and no significant LOB. Pt returning to wheelchair to take rest break and OT assisted pt back to room. Pt stood and transferred into bed with CGA and supervision for bed mobility. Bed alarm activated and call bell within reach upon exiting the room.   Therapy Documentation Precautions:  Precautions Precautions: Fall Precaution Comments: monitor for agitation Restrictions Weight Bearing Restrictions: No General: General OT Amount of Missed Time: 33 Minutes Vital Signs: Therapy Vitals Temp: 98.6 F (37 C) Pulse Rate: 69 Resp: 16 BP: (!) 153/74 Patient Position (if appropriate): Lying Oxygen Therapy SpO2: 99 % O2 Device: Room Air ADL: ADL Upper Body Bathing: Setup Where Assessed-Upper Body Bathing: Shower Lower Body Bathing: Moderate assistance Where Assessed-Lower Body Bathing: Shower Upper Body Dressing: Supervision/safety Where Assessed-Upper Body Dressing: Sitting at sink, Wheelchair Lower Body Dressing: Maximal assistance Where Assessed-Lower Body Dressing: Sitting at sink, Medical laboratory scientific officer: Minimal assistance Tub/Shower Transfer: Minimal assistance Tub/Shower Transfer Method: Stand pivot Tub/Shower  Equipment: Transfer tub bench   Therapy/Group: Individual Therapy  Gypsy Decant 12/02/2019, 4:48 PM

## 2019-12-03 ENCOUNTER — Ambulatory Visit (HOSPITAL_COMMUNITY): Payer: Medicare PPO

## 2019-12-03 ENCOUNTER — Other Ambulatory Visit (HOSPITAL_COMMUNITY): Payer: Self-pay | Admitting: Physician Assistant

## 2019-12-03 ENCOUNTER — Encounter (HOSPITAL_COMMUNITY): Payer: Medicare PPO | Admitting: Occupational Therapy

## 2019-12-03 ENCOUNTER — Inpatient Hospital Stay (HOSPITAL_COMMUNITY): Payer: Medicare PPO

## 2019-12-03 LAB — RENAL FUNCTION PANEL
Albumin: 2 g/dL — ABNORMAL LOW (ref 3.5–5.0)
Anion gap: 10 (ref 5–15)
BUN: 103 mg/dL — ABNORMAL HIGH (ref 8–23)
CO2: 26 mmol/L (ref 22–32)
Calcium: 8.6 mg/dL — ABNORMAL LOW (ref 8.9–10.3)
Chloride: 99 mmol/L (ref 98–111)
Creatinine, Ser: 4.37 mg/dL — ABNORMAL HIGH (ref 0.61–1.24)
GFR calc Af Amer: 15 mL/min — ABNORMAL LOW (ref >60)
GFR calc non Af Amer: 13 mL/min — ABNORMAL LOW (ref >60)
Glucose, Bld: 123 mg/dL — ABNORMAL HIGH (ref 70–99)
Phosphorus: 4.5 mg/dL (ref 2.5–4.6)
Potassium: 4.3 mmol/L (ref 3.5–5.1)
Sodium: 135 mmol/L (ref 135–145)

## 2019-12-03 LAB — CBC WITH DIFFERENTIAL/PLATELET
Abs Immature Granulocytes: 0.01 10*3/uL (ref 0.00–0.07)
Basophils Absolute: 0 10*3/uL (ref 0.0–0.1)
Basophils Relative: 0 %
Eosinophils Absolute: 0 10*3/uL (ref 0.0–0.5)
Eosinophils Relative: 1 %
HCT: 25.7 % — ABNORMAL LOW (ref 39.0–52.0)
Hemoglobin: 8.2 g/dL — ABNORMAL LOW (ref 13.0–17.0)
Immature Granulocytes: 0 %
Lymphocytes Relative: 23 %
Lymphs Abs: 1.4 10*3/uL (ref 0.7–4.0)
MCH: 30 pg (ref 26.0–34.0)
MCHC: 31.9 g/dL (ref 30.0–36.0)
MCV: 94.1 fL (ref 80.0–100.0)
Monocytes Absolute: 0.4 10*3/uL (ref 0.1–1.0)
Monocytes Relative: 7 %
Neutro Abs: 4.1 10*3/uL (ref 1.7–7.7)
Neutrophils Relative %: 69 %
Platelets: 185 10*3/uL (ref 150–400)
RBC: 2.73 MIL/uL — ABNORMAL LOW (ref 4.22–5.81)
RDW: 16.6 % — ABNORMAL HIGH (ref 11.5–15.5)
WBC: 6.1 10*3/uL (ref 4.0–10.5)
nRBC: 0 % (ref 0.0–0.2)

## 2019-12-03 LAB — GLUCOSE, CAPILLARY
Glucose-Capillary: 116 mg/dL — ABNORMAL HIGH (ref 70–99)
Glucose-Capillary: 157 mg/dL — ABNORMAL HIGH (ref 70–99)
Glucose-Capillary: 218 mg/dL — ABNORMAL HIGH (ref 70–99)
Glucose-Capillary: 201 mg/dL — ABNORMAL HIGH (ref 70–99)

## 2019-12-03 MED ORDER — CARVEDILOL 25 MG PO TABS: 25.0000 mg | ORAL_TABLET | Freq: Two times a day (BID) | ORAL | 0 refills | Status: DC

## 2019-12-03 MED ORDER — ATORVASTATIN CALCIUM 80 MG PO TABS: 80.0000 mg | ORAL_TABLET | Freq: Every day | ORAL | 3 refills | Status: DC

## 2019-12-03 MED ORDER — MELATONIN 3 MG PO TABS: 3.0000 mg | ORAL_TABLET | Freq: Every day | ORAL | 0 refills | Status: DC

## 2019-12-03 MED ORDER — FUROSEMIDE 80 MG PO TABS: 80.0000 mg | ORAL_TABLET | Freq: Every day | ORAL | 0 refills | Status: DC

## 2019-12-03 MED ORDER — AMLODIPINE BESYLATE 5 MG PO TABS: 5.0000 mg | ORAL_TABLET | Freq: Every day | ORAL | 0 refills | Status: DC

## 2019-12-03 MED ORDER — ACETAMINOPHEN 325 MG PO TABS: 325.0000 mg | ORAL_TABLET | ORAL | Status: DC | PRN

## 2019-12-03 MED ORDER — ACCU-CHEK GUIDE W/DEVICE KIT: 1.0000 | PACK | Freq: Every day | 0 refills | Status: AC

## 2019-12-03 MED ORDER — ISOSORBIDE MONONITRATE ER 60 MG PO TB24: 60.0000 mg | ORAL_TABLET | Freq: Every day | ORAL | 0 refills | Status: DC

## 2019-12-03 MED ORDER — FINASTERIDE 5 MG PO TABS: 5.0000 mg | ORAL_TABLET | Freq: Every day | ORAL | 0 refills | Status: DC

## 2019-12-03 MED ORDER — DORZOLAMIDE HCL-TIMOLOL MAL 2-0.5 % OP SOLN: 1.0000 [drp] | Freq: Two times a day (BID) | OPHTHALMIC | 10 refills | Status: DC

## 2019-12-03 MED ORDER — TAMSULOSIN HCL 0.4 MG PO CAPS: 0.4000 mg | ORAL_CAPSULE | Freq: Every day | ORAL | 0 refills | Status: DC

## 2019-12-03 MED ORDER — FERROUS GLUCONATE 324 (38 FE) MG PO TABS: 324.0000 mg | ORAL_TABLET | Freq: Every day | ORAL | 3 refills | Status: DC

## 2019-12-03 MED ORDER — CLOPIDOGREL BISULFATE 75 MG PO TABS: 75.0000 mg | ORAL_TABLET | Freq: Every day | ORAL | 1 refills | Status: DC

## 2019-12-03 MED ORDER — ACCU-CHEK GUIDE VI STRP: ORAL_STRIP | 3 refills | Status: AC

## 2019-12-03 MED ORDER — VITAMIN B-12 1000 MCG PO TABS: 1000.0000 ug | ORAL_TABLET | Freq: Every day | ORAL | 0 refills | Status: DC

## 2019-12-03 MED ORDER — ACCU-CHEK FASTCLIX LANCETS MISC: 3 refills | Status: AC

## 2019-12-03 MED FILL — ISOSORBIDE MN ER 60 MG TAB: 60 | 30 days supply | Qty: 30 | Fill #0

## 2019-12-03 MED FILL — ACCU-CHEK GUIDE STRP: 90 days supply | Qty: 100 | Fill #0

## 2019-12-03 MED FILL — FUROSEMIDE 80 MG TAB: 80 | 30 days supply | Qty: 30 | Fill #0

## 2019-12-03 MED FILL — ACCU-CHEK GUIDE W/DEVICE KI: W/DEVICE | 1 days supply | Qty: 1 | Fill #0

## 2019-12-03 MED FILL — AMLODIPINE BESYLATE 5 MG TA: 5 | 30 days supply | Qty: 30 | Fill #0

## 2019-12-03 MED FILL — CARVEDILOL 25 MG TABLET: 25 | 30 days supply | Qty: 60 | Fill #0

## 2019-12-03 MED FILL — ACCU-CHEK FASTCLIX LANCETS: 90 days supply | Qty: 102 | Fill #0

## 2019-12-03 MED FILL — FERROUS GLUCONATE 324 MG TA: 324 (38 FE) | 30 days supply | Qty: 30 | Fill #0

## 2019-12-03 MED FILL — CLOPIDOGREL 75 MG TABLET: 75 | 30 days supply | Qty: 30 | Fill #0

## 2019-12-03 MED FILL — DORZOLAMIDE-TIMOLOL EYE DRP: 22.3-6.8 | 75 days supply | Qty: 10 | Fill #0

## 2019-12-03 MED FILL — ATORVASTATIN 80 MG TABLET: 80 | 90 days supply | Qty: 90 | Fill #0

## 2019-12-03 MED FILL — FINASTERIDE 5 MG TABLET: 5 | 30 days supply | Qty: 30 | Fill #0

## 2019-12-03 NOTE — Progress Notes (Signed)
Physical Therapy Discharge Summary  Patient Details  Name: Dennis Macias MRN: [id number] Date of Birth: 1950/08/19  Today's Date: 12/03/2019 PT Individual Time: 1109-1204 PT Individual Time Calculation (min): 55 min    Patient has met 9 of 10 long term goals due to improved activity tolerance, improved balance, improved postural control and increased strength.  Patient to discharge at an ambulatory level Supervision.   Patient's care partner is independent to provide the necessary physical assistance at discharge.  Reasons goals not met: Pt requires minA to stand from low car height. Son able to provide assistance.  Recommendation:  Patient will benefit from ongoing skilled PT services in home health setting to continue to advance safe functional mobility, address ongoing impairments in strength, balance, transfers, ambulation, endurance, and minimize fall risk.  Equipment: No equipment provided  Reasons for discharge: treatment goals met and discharge from hospital  Patient/family agrees with progress made and goals achieved: Yes   Skilled Therapeutic Interventions:  1st Session: Pt received supine in bed with family present for family education. No report of pain. PT provides education on safe body mechanics, positioning, and guarding of pt to facilitate mobility following discharge. Supine to sit mod(I). Pt ambulates 110' with supervision and RW, with cues for upright gaze, increased upright posture, and increased proximity to RW for safety. Pt performs car transfer with son providing minA and use of RW. Ambulates up and down ramp with CGA. WC transport to therapy gym for energy conservation. Pt performs x12 steps with LHR and quad cane with PT providing CGA. Performs additional 12 steps with son providing CGA. Pt ambulates 220' with RW and close supervision. PT provides pt's family with HEP and answers questions regarding performance. Pt performs x10 reps heel raises with RW and CGA. Sit to  supine mod(I). Pt left supine in bed with alarm intact and all needs within reach.  2nd Session: Pt received supine and agreeable to therapy. Supine to sit mod(I). Stand step to [facility] with supervision for safety and cues on hand placement. Pt attempts standing balance activity with Wii but quickly becomes frustrated and requests different activity. WC transport outside for ambulation over varying surfaces. Pt ambulates x2 bouts of 110' with quad cane and CGA from PT. No LOBs but pt does reach out for columns and other objects with LUE. Pt requires extended seated rest breaks. WC transport back to room. Sit to supine at mod(I). Pt misses 15 minutes of session due to fatigue and requesting to rest. Left supine in bed with alarm intact and all needs within reach.  PT Discharge Precautions/Restrictions Precautions Precautions: Fall Restrictions Weight Bearing Restrictions: No Vital Signs Therapy Vitals Temp: 98.1 F (36.7 C) Temp Source: Oral Pulse Rate: 67 Resp: 18 BP: (!) 148/72 Patient Position (if appropriate): Lying Oxygen Therapy SpO2: 100 % O2 Device: Room Air Pain Pain Assessment Pain Scale: 0-10 Pain Score: 0-No pain Vision/Perception  Perception Perception: Within Functional Limits Praxis Praxis: Intact  Cognition Overall Cognitive Status: History of cognitive impairments - at baseline Arousal/Alertness: Awake/alert Orientation Level: Oriented X4 Safety/Judgment: Appears intact Sensation Sensation Light Touch: Appears Intact Coordination Fine Motor Movements are Fluid and Coordinated: Yes Motor  Motor Motor: Within Functional Limits  Mobility Bed Mobility Bed Mobility: Supine to Sit;Sit to Supine Supine to Sit: Independent Sit to Supine: Independent Transfers Transfers: Sit to Stand;Stand to Sit;Stand Pivot Transfers Sit to Stand: Supervision/Verbal cueing Stand to Sit: Supervision/Verbal cueing Stand Pivot Transfers: Supervision/Verbal cueing Stand Pivot  Transfer Details: Verbal cues for  sequencing;Verbal cues for technique Transfer (Assistive device): None Locomotion  Gait Ambulation: Yes Gait Assistance: Supervision/Verbal cueing Gait Distance (Feet): 220 Feet Assistive device: Rolling walker Gait Assistance Details: Verbal cues for technique;Verbal cues for sequencing Gait Gait: Yes Gait Pattern: Impaired Gait Pattern: Decreased stride length;Decreased step length - left;Decreased step length - right;Poor foot clearance - right;Poor foot clearance - left;Wide base of support Stairs / Additional Locomotion Stairs: Yes Stairs Assistance: Contact Guard/Touching assist Stair Management Technique: One rail Left;With cane Number of Stairs: 12 Height of Stairs: 6 Ramp: Contact Guard/touching assist Curb: Contact Guard/Touching assist Wheelchair Mobility Wheelchair Mobility: No  Trunk/Postural Assessment  Cervical Assessment Cervical Assessment: Exceptions to WFL(forward head) Thoracic Assessment Thoracic Assessment: Exceptions to WFL(rounded shoulders) Lumbar Assessment Lumbar Assessment: Exceptions to WFL(posterior pelvic tilt) Postural Control Postural Control: Deficits on evaluation Postural Limitations: decreased  Balance Static Sitting Balance Static Sitting - Level of Assistance: 7: Independent Dynamic Sitting Balance Dynamic Sitting - Level of Assistance: 6: Modified independent (Device/Increase time) Static Standing Balance Static Standing - Level of Assistance: 5: Stand by assistance Dynamic Standing Balance Dynamic Standing - Level of Assistance: 5: Stand by assistance Extremity Assessment  RUE Assessment General Strength Comments: proximal reach 3/4 to full, distal WFL LUE Assessment General Strength Comments: proximal reach 3/4 to full, distal WFL RLE Assessment RLE Assessment: Exceptions to [facility] Active Range of Motion (AROM) Comments: WFL General Strength Comments: Grossly 4/5 LLE Assessment LLE Assessment:  Exceptions to [facility] Active Range of Motion (AROM) Comments: [facility] General Strength Comments: Grossly 4/5    Breck Coons 12/03/2019, 4:21 PM

## 2019-12-03 NOTE — Discharge Instructions (Signed)
Inpatient Rehab Discharge Instructions  Dennis Macias Discharge date and time: No discharge date for patient encounter.   Activities/Precautions/ Functional Status: Activity: activity as tolerated Diet: Carb modified 1200 mL fluid restriction Wound Care: none needed Functional status:  ___ No restrictions     ___ Walk up steps independently ___ 24/7 supervision/assistance   ___ Walk up steps with assistance ___ Intermittent supervision/assistance  ___ Bathe/dress independently ___ Walk with walker     _x__ Bathe/dress with assistance ___ Walk Independently    ___ Shower independently ___ Walk with assistance    ___ Shower with assistance ___ No alcohol     ___ Return to work/school ________  COMMUNITY REFERRALS UPON DISCHARGE:    Home Health:   PT     OT     ST    RN                    [facility]   Phone: [phone]  *Home health services will resume with previous agency. Please expect follow-up to schedule your home visit within 2-3 days from discharge. If you have not received follow-up, be sure to contact the agency directly.   Medical Equipment/Items Ordered: tub transfer bench                                                 Agency/Supplier: [facility] [phone]   Special Instructions: No driving smoking or alcohol  Follow-up appointment with renal services [phone] early next week for close monitoring of renal function   My questions have been answered and I understand these instructions. I will adhere to these goals and the provided educational materials after my discharge from the hospital.  Patient/Caregiver Signature _______________________________ Date __________  Clinician Signature _______________________________________ Date __________  Please bring this form and your medication list with you to all your follow-up doctor's appointments.

## 2019-12-03 NOTE — Progress Notes (Addendum)
Patient ID: Dennis Macias, male   DOB: 11-09-1950, 69 y.o.   MRN: [id number]  DME order: TTB sent to [place] via parachute.   Pt active with [facility] for HHPT/SN. Pt will need HHPT/OT/ST. Orders sent to [facility].   *DME delivered to room.   Loralee Pacas, MSW, [place] Office: [phone] Cell: [phone] Fax: [phone]

## 2019-12-03 NOTE — Progress Notes (Signed)
[place] PHYSICAL MEDICINE & REHABILITATION PROGRESS NOTE  Subjective/Complaints:  Pt reports no issues- asleep- breakfast at bedside- didn't notice was here.   Says doing well.    ROS:  Pt denies SOB, abd pain, CP, N/V/C/D, and vision changes   Objective:   No results found. Recent Labs    11/30/19 0932 12/03/19 0619  WBC 4.6 6.1  HGB 8.6* 8.2*  HCT 27.1* 25.7*  PLT 212 185   Recent Labs    12/02/19 0533 12/03/19 0619  NA 136 135  K 4.2 4.3  CL 100 99  CO2 25 26  GLUCOSE 122* 123*  BUN 96* 103*  CREATININE 4.33* 4.37*  CALCIUM 8.7* 8.6*    Intake/Output Summary (Last 24 hours) at 12/03/2019 0917 Last data filed at 12/03/2019 0830 Gross per 24 hour  Intake 420 ml  Output 350 ml  Net 70 ml     Physical Exam: Vital Signs Blood pressure (!) 142/58, pulse 71, temperature 97.8 F (36.6 C), resp. rate 17, height 6' (1.829 m), weight 75.6 kg, SpO2 91 %.  Physical Exam  Nursing note and vitals reviewed. Constitutional: asleep- woke to verbal stimuli; appropriate, NAD HENT: blind R eye- non reactive pupil  CV:  RRR Respiratory: CTA B/L GI: Soft, NT, ND, (+)BS pressure dressing on LLQ  Musculoskeletal:     Comments: swelling in LEs almost resolved Neurological: slowed processing- chronic Motor: 4+/5 throughout  Skin: Skin is warm and dry. IVs in both arms Stasis changes bilateral shins.   Psychiatric:flat affect   Assessment/Plan: 1. Functional deficits secondary to toxic metabolic encephalopathy which require 3+ hours per day of interdisciplinary therapy in a comprehensive inpatient rehab setting.  Physiatrist is providing close team supervision and 24 hour management of active medical problems listed below.  Physiatrist and rehab team continue to assess barriers to discharge/monitor patient progress toward functional and medical goals  Care Tool:  Bathing    Body parts bathed by patient: Right arm, Left arm, Chest, Abdomen, Front perineal area,  Buttocks, Right upper leg, Left upper leg, Face, Right lower leg, Left lower leg         Bathing assist Assist Level: Contact Guard/Touching assist(Seated on TTB)     Upper Body Dressing/Undressing Upper body dressing Upper body dressing/undressing activity did not occur (including orthotics): N/A What is the patient wearing?: Pull over shirt    Upper body assist Assist Level: Supervision/Verbal cueing    Lower Body Dressing/Undressing Lower body dressing      What is the patient wearing?: Incontinence brief, Pants     Lower body assist Assist for lower body dressing: Contact Guard/Touching assist     Toileting Toileting    Toileting assist Assist for toileting: Contact Guard/Touching assist     Transfers Chair/bed transfer  Transfers assist     Chair/bed transfer assist level: Contact Guard/Touching assist     Locomotion Ambulation   Ambulation assist      Assist level: Contact Guard/Touching assist Assistive device: Hand held assist Max distance: 30'   Walk 10 feet activity   Assist     Assist level: Contact Guard/Touching assist Assistive device: Walker-rolling   Walk 50 feet activity   Assist    Assist level: Contact Guard/Touching assist Assistive device: Walker-rolling    Walk 150 feet activity   Assist    Assist level: Contact Guard/Touching assist Assistive device: Walker-rolling    Walk 10 feet on uneven surface  activity   Assist     Assist level:  Minimal Assistance - Patient > 75% Assistive device: Hand held assist   Wheelchair     Assist Will patient use wheelchair at discharge?: No             Wheelchair 50 feet with 2 turns activity    Assist            Wheelchair 150 feet activity     Assist          Blood pressure (!) 142/58, pulse 71, temperature 97.8 F (36.6 C), resp. rate 17, height 6' (1.829 m), weight 75.6 kg, SpO2 91 %.  Medical Problem List and Plan: 1.  Deficits with  mobility, transfers, self-care secondary to debility with metabolic encephalopathy.             -patient may shower             -ELOS/Goals: 7-10 days/Supervision/Mod I             Continue CIR 2.  Antithrombotics: -DVT/anticoagulation:  Pharmaceutical: Heparin             -antiplatelet therapy: DAPT 3. Pain Management: Tylenol prn. Well controlled 4. Mood: LCSW to follow for evaluation and support.              -antipsychotic agents: N/A 5. Neuropsych: This patient is not fully capable of making decisions on his own behalf. 6. Skin/Wound Care: Routine pressure relief measures.  7. Fluids/Electrolytes/Nutrition: Monitor I/Os. Monitor for signs of overload- check daily weights.  Monitor renal status for recovery.    Filed Weights   11/30/19 0436 12/01/19 0500 12/02/19 0406  Weight: 79.5 kg 79.6 kg 75.6 kg   5/21- no weight for today, but yesterday weight was down 2 kg- will monitor  5/22- Weight back to 79 kg- will monitor to make sure not retaining a lot of fluid.   5/23- Pt's weight 79.6- stable from yesterday  5/25- stable weight  5/26- weight down 4 Kg- not sure if correct?  5/27- no weight today 8. Cefipime induced encephalopathy: Improving 9. Acute on chronic renal failure: Followed by Dr. Hollie Salk. SCr-40.9 today.               CMP ordered for tomorrow.   5/20- Cr stable at 4.09- was 4.07- BUN worse at 66- was 54. Renal signed off- will recheck this weekend.   5/21- Cr came back late yesterday 4.16 and now 4.27- is still rising- if hits 4.5, will call renal back  5/22- Cr up to 4.39- still rising- if increasing to 4.5- will call renal back to see if can help. Also hyperkalemic  5/23- Cr still rising 4.50 and BUN up to 87- concerned since not stopping the rise- called renal consult and spoke to Dr. Jonnie Finner -they will see pt.   5/25- Cr down to 4.35 (4.36 yesterday)- better s/p diuresis  5/26- Cr down to 4.33 - barely moved- renal said might increase Lasix.   5/27- Lasix held this  afternoon due to elevated BUN- lasix 80 mg PO starting tomorrow. 10 HTN: Monitor BP tid--on Norvasc, Imdur, Hydralazine and Coreg.  5/20- BP well controlled- 120s/60s   5/23- BP slightly elevated but overall stable- con't meds             Monitor with increased mobility  5/25- BP slightly elevated 150s/70s- con't regimen for now  5/27- BP 140s/80s-  11. NSTEMI/ICM: Has significant LAD --to be treated medically with Coreg, Hydralazine, Imdur, ASA/Plavix and Lipitor. PCI if patient progresses to ESRD.  5/22- monitoring for Sx's 12. Pseudomonas UTI: Completed IV antibiotics on 05/08.  13. BPH s/p TURP 4/13: Continue Proscar. Condom cath in place with cloudy urine in tubing. Will check PVRs to monitor voiding. Toilet every 4 hours during the day.    5/20- PVRs show very low post void residuals  5/26- CBC tomorrow             Monitor for retention 14. Anemia of chronic disease: On Aranesp--increased to 156mcg/weekly today.              CBC ordered for tomorrow  5/20- Hb 7.9- aranesp just increased due to dwindling Hb.  15. Hyperkalemia  5/21- will order Lokelma 5G daily since K+ rising - was 5.0, then 5.4, then now 5.6-   5/22-will Increase lokelma to 10g and added a dose of Kayexylate to get it down faster   5/23- K= only down to 5.6 after 5 BMs yesterday- have called renal for assistance.  5/25- K+ down to 4.3- doing much better  5/26- K+ 4.2 16. DM- A1c 8.5-   5/21- will start SSI and BG checks- will d/w pharmacy about meds considering rising renal function.   CBG (last 3)  Recent Labs    12/02/19 1650 12/02/19 2055 12/03/19 0627  GLUCAP 128* 158* 116*    5/22- BGs vAriable- but cannot add Oral agent due to rising Cr- will monitor for another day and add insulin dosing based on BGs.   5/23- BGs overall look good, actually- will con't to monitor.   5/24: CBGs little elevated last night, well controlled today.  5/25- BGs better- [id number]- con't regimen  5/27- BGs 116-156-  great control- con't regimen   Continue to monitor.  17. Anemia: Hgb 8.6; stable.  18. Insomnia: started Melatonin.  5/26- sleeping better- con't meds  19. Dispo  5/27- have PCP determine DM regimen since cannot do Oral agent and f/u with renal ASAP after d/c.   LOS: 8 days A FACE TO FACE EVALUATION WAS PERFORMED  Zen Cedillos 12/03/2019, 9:17 AM

## 2019-12-03 NOTE — Progress Notes (Signed)
In report this nurse received that the patient's last bowel movement was 5/25, however according to the chart the last bowel movement was 5/23. This nurse gave Miralax and patient had smear/small bowel movement. Nurse offered suppository to patient and patient declined. Patient also inially declined all evening medications, but with some education the patient agreed. Nurse educated patient on importance of maintaining bowel movements. Will let oncoming nurse know. [place]

## 2019-12-03 NOTE — Progress Notes (Signed)
Occupational Therapy Discharge Summary  Patient Details  Name: Dennis Macias MRN: [id number] Date of Birth: 11/20/1950  Today's Date: 12/03/2019 OT Individual Time: 1000-1045 OT Individual Time Calculation (min): 45 min    Patient in bed, alert.  Wife present and son later joined session.  Reviewed role of OT with family and patient's current level of function and recommended supervision/assistance needs.  Patient denies pain but often grumbling to himself and reluctant to participate.  He demonstrated ability to perform bed mobility at mod I level.  He completed UB and LB dressing with set up/CS.  Sit to stand, SPT and ambulation with RW CGA/CS.  Occ assistance needed to stand from low surfaces.  Reviewed home set up and optimal height of surfaces to promote independence.  Provided cues and strategies for family during transfer and mobility tasks.  Patient ambulated with RW to/from therapy CS level.  He demonstrated transfer to/from tub bench with CS.  Reviewed set up and use of tub bench with family - they plan to purchase for home.  Discussed activity and exercise recommendations in home environment.  Patient's wife and son demonstrate good understanding of patients current needs and discharge plan.  Returned to bed at close of session with CS, family present and PT session to start soon.    Patient has met 11 of 11 long term goals due to improved activity tolerance, improved balance, postural control and improved coordination.  Patient to discharge at overall Supervision level.  Patient's care partner is independent to provide the necessary physical and cognitive assistance at discharge.    Reasons goals not met: na  Recommendation:  Patient will benefit from ongoing skilled OT services in home health setting to continue to advance functional skills in the area of BADL and Reduce care partner burden.  Equipment: tub transfer bench to be obtained by family  Reasons for discharge: treatment goals  met  Patient/family agrees with progress made and goals achieved: Yes  OT Discharge Precautions/Restrictions  Precautions Precautions: Fall Restrictions Weight Bearing Restrictions: No General OT Amount of Missed Time: 15 Minutes Vital Signs Therapy Vitals Temp: 98.1 F (36.7 C) Temp Source: Oral Pulse Rate: 67 Resp: 18 BP: (!) 148/72 Patient Position (if appropriate): Lying Oxygen Therapy SpO2: 100 % O2 Device: Room Air Pain Pain Assessment Pain Scale: 0-10 Pain Score: 0-No pain ADL ADL Eating: Independent Grooming: Modified independent Where Assessed-Grooming: Sitting at sink Upper Body Bathing: Setup Where Assessed-Upper Body Bathing: Shower Lower Body Bathing: Supervision/safety Where Assessed-Lower Body Bathing: Shower Upper Body Dressing: Setup Where Assessed-Upper Body Dressing: Chair Lower Body Dressing: Supervision/safety Where Assessed-Lower Body Dressing: Chair Toileting: Supervision/safety Where Assessed-Toileting: Glass blower/designer: Close supervision Toilet Transfer Method: Counselling psychologist: Radiographer, therapeutic: Close supervison Clinical cytogeneticist Method: Optometrist: Gaffer Baseline Vision/History: Wears glasses Wears Glasses: Reading only Patient Visual Report: No change from baseline(patient with absent vision right eye) Perception  Perception: Within Functional Limits Praxis Praxis: Intact Cognition Overall Cognitive Status: History of cognitive impairments - at baseline Arousal/Alertness: Awake/alert Orientation Level: Oriented X4 Safety/Judgment: Appears intact Sensation Sensation Light Touch: Appears Intact Coordination Fine Motor Movements are Fluid and Coordinated: Yes Mobility  Bed Mobility Supine to Sit: Independent Sit to Supine: Independent Transfers Sit to Stand: Supervision/Verbal cueing Stand to Sit: Supervision/Verbal cueing   Balance Static Sitting Balance Static Sitting - Level of Assistance: 7: Independent Dynamic Sitting Balance Dynamic Sitting - Level of Assistance: 6: Modified independent (Device/Increase time) Static Standing Balance Static  Standing - Level of Assistance: 5: Stand by assistance Dynamic Standing Balance Dynamic Standing - Level of Assistance: 5: Stand by assistance Extremity/Trunk Assessment RUE Assessment General Strength Comments: proximal reach 3/4 to full, distal WFL LUE Assessment General Strength Comments: proximal reach 3/4 to full, distal WFL   Carlos Levering 12/03/2019, 2:31 PM

## 2019-12-03 NOTE — Progress Notes (Signed)
[facility] Progress Note   Subjective:   No new complaints. No uremic symptoms. I/Os yest recorded 420 / 200 but weight this am down 4kg and he says standing wt. Family in room today - well aware of CKD and have been to [place] visits with him.  D/c tomorrow.   Objective Vitals:   12/02/19 0406 12/02/19 1503 12/02/19 1934 12/03/19 0322  BP: (!) 127/57 (!) 153/74 133/73 (!) 142/58  Pulse: 66 69 70 71  Resp: 16 16 17 17   Temp: 98.8 F (37.1 C) 98.6 F (37 C) 98.5 F (36.9 C) 97.8 F (36.6 C)  TempSrc: Oral  Oral   SpO2: 97% 99% 96% 91%  Weight: 75.6 kg     Height:       Physical Exam General: in wheelchair, appears well Heart: RRR, no rub Lungs: normal WOB, clear ant, decreased BS bilateral bases, no rales Abdomen: soft, nontender Extremities: trace LE edema - improved through week Neuro:  AOx3, seems unclear on details, very hard of hearing  Additional Objective Labs: Basic Metabolic Panel: Recent Labs  Lab 12/01/19 0707 12/02/19 0533 12/03/19 0619  NA 136 136 135  K 4.3 4.2 4.3  CL 102 100 99  CO2 25 25 26   GLUCOSE 157* 122* 123*  BUN 95* 96* 103*  CREATININE 4.35* 4.33* 4.37*  CALCIUM 8.7* 8.7* 8.6*  PHOS 4.3 4.3 4.5   Liver Function Tests: Recent Labs  Lab 12/01/19 0707 12/02/19 0533 12/03/19 0619  ALBUMIN 2.1* 2.1* 2.0*   No results for input(s): LIPASE, AMYLASE in the last 168 hours. CBC: Recent Labs  Lab 11/30/19 0932 12/03/19 0619  WBC 4.6 6.1  NEUTROABS  --  4.1  HGB 8.6* 8.2*  HCT 27.1* 25.7*  MCV 94.4 94.1  PLT 212 185   Blood Culture    Component Value Date/Time   SDES URINE, RANDOM 11/13/2019 2212   SPECREQUEST  11/13/2019 2212    NONE Performed at [facility], [place] [address], [place], [place] [address]    CULT MULTIPLE SPECIES PRESENT, SUGGEST RECOLLECTION (A) 11/13/2019 2212   REPTSTATUS 11/15/2019 FINAL 11/13/2019 2212    Cardiac Enzymes: No results for input(s): CKTOTAL, CKMB, CKMBINDEX, TROPONINI in  the last 168 hours. CBG: Recent Labs  Lab 12/02/19 0632 12/02/19 1147 12/02/19 1650 12/02/19 2055 12/03/19 0627  GLUCAP 133* 220* 128* 158* 116*   Iron Studies: No results for input(s): IRON, TIBC, TRANSFERRIN, FERRITIN in the last 72 hours. @lablastinr3 @ Studies/Results: No results found. Medications:  . amLODipine  5 mg Oral Daily  . aspirin  81 mg Oral Daily  . atorvastatin  80 mg Oral QHS  . carvedilol  25 mg Oral BID WC  . clopidogrel  75 mg Oral Daily  . darbepoetin (ARANESP) injection - NON-DIALYSIS  100 mcg Subcutaneous Q Tue-1800  . dorzolamide-timolol  1 drop Right Eye BID  . feeding supplement (ENSURE ENLIVE)  237 mL Oral TID BM  . feeding supplement (PRO-STAT SUGAR FREE 64)  30 mL Oral BID  . ferrous gluconate  324 mg Oral Q breakfast  . finasteride  5 mg Oral Daily  . furosemide  80 mg Oral Daily  . heparin  5,000 Units Subcutaneous Q8H  . hydrocerin   Topical BID  . insulin aspart  0-5 Units Subcutaneous QHS  . insulin aspart  0-6 Units Subcutaneous TID WC  . isosorbide mononitrate  60 mg Oral Daily  . mouth rinse  15 mL Mouth Rinse BID  . melatonin  3 mg Oral QHS  . tamsulosin  0.4 mg Oral QPC supper  . vitamin B-12  1,000 mcg Oral Daily    Assessment/ Plan: 1. AoCKD 4 - baseline creat 3.5-4 (followed by Dr. Hollie Salk at [facility]), up to 4.5 5/23 and now hovering in the 4.3 range. Had contrast but that was 10 days ago and low vol 20 cc. Suspect this is cardiorenal, pt has diffuse LE, hip and flank edema.  Renal US without obstruction.  Check bladder scan qShift with h/o retention > no issues.  FeNa c/w intrinsic issue.  I will get him a f/u at [facility] in 7-10 days to evaluate volume status and labs; he and family are aware that renal function is worse.  I discussed dialysis with him - not needed at this moment but will likely need to start process for AV access if not improved at f/u.   2. Hyperkalemia - resolved 3. Acute + chronic syst CHF/ vol excess - EF 40-45%, no  resp issues, mostly peripheral edema though 5/23 CXR with pleural effusions and pulmonary edema. Discharge on lasix 80 po BID and low sodium diet.  Discussed with he and wife today.  Wife knows how to be in touch with our office should he develop worsening volume overload prior to f/u visit.  4. HTN - BP fine this AM.  On coreg, imdur, amlodipine 5. CAD - sp LHC 5/13, LAD disease 6. DM2 7. Blindness 8. Recent TURP - April 2021, on flomax and finasteride 9. Anemia:  Hb mid 8s. Iron sats ok.  Gave aranesp 100 12/01/19 q2wks.  Can be referred for outpt aranesp when he f/u.  10. Secondary hyperPTH: 09/2019 PTH 145, phos and corr ca ok here.   I will arrange his f/u at clinic 7-10d, ok for discharge from renal perspective.  Call with questions, I'll sign off today as he is going out tomorrow.  [name] 12/03/2019, 11:40 AM  [facility] Pager: [phone]

## 2019-12-04 ENCOUNTER — Ambulatory Visit: Payer: Medicare PPO | Admitting: Interventional Cardiology

## 2019-12-04 DIAGNOSIS — G92 Toxic encephalopathy: Secondary | ICD-10-CM

## 2019-12-04 LAB — RENAL FUNCTION PANEL
Albumin: 2 g/dL — ABNORMAL LOW (ref 3.5–5.0)
Anion gap: 8 (ref 5–15)
BUN: 105 mg/dL — ABNORMAL HIGH (ref 8–23)
CO2: 26 mmol/L (ref 22–32)
Calcium: 8.3 mg/dL — ABNORMAL LOW (ref 8.9–10.3)
Chloride: 101 mmol/L (ref 98–111)
Creatinine, Ser: 4.31 mg/dL — ABNORMAL HIGH (ref 0.61–1.24)
GFR calc Af Amer: 15 mL/min — ABNORMAL LOW (ref >60)
GFR calc non Af Amer: 13 mL/min — ABNORMAL LOW (ref >60)
Glucose, Bld: 112 mg/dL — ABNORMAL HIGH (ref 70–99)
Phosphorus: 4.3 mg/dL (ref 2.5–4.6)
Potassium: 4.4 mmol/L (ref 3.5–5.1)
Sodium: 135 mmol/L (ref 135–145)

## 2019-12-04 LAB — GLUCOSE, CAPILLARY: Glucose-Capillary: 106 mg/dL — ABNORMAL HIGH (ref 70–99)

## 2019-12-04 MED ORDER — SORBITOL 70 % SOLN: 30.0000 mL | Freq: Once | Status: DC

## 2019-12-04 NOTE — Progress Notes (Signed)
PHYSICAL MEDICINE & REHABILITATION PROGRESS NOTE  Subjective/Complaints:  Pt hasn't had a BM in a few days- he refused PM meds last night- finally got him to take them- suggest nursing give prn bowel meds.   D/c today. No questions- did let him know will need to see renal this next week and f/u with me in ~ 2-3 weeks.   ROS:  Pt denies SOB, abd pain, CP, N/V/C/D, and vision changes   Objective:   No results found. Recent Labs    12/03/19 0619  WBC 6.1  HGB 8.2*  HCT 25.7*  PLT 185   Recent Labs    12/03/19 0619 12/04/19 0622  NA 135 135  K 4.3 4.4  CL 99 101  CO2 26 26  GLUCOSE 123* 112*  BUN 103* 105*  CREATININE 4.37* 4.31*  CALCIUM 8.6* 8.3*    Intake/Output Summary (Last 24 hours) at 12/04/2019 0858 Last data filed at 12/04/2019 8185 Gross per 24 hour  Intake 520 ml  Output 475 ml  Net 45 ml     Physical Exam: Vital Signs Blood pressure (!) 144/59, pulse 72, temperature 98.3 F (36.8 C), temperature source Oral, resp. rate 18, height 6' (1.829 m), weight 80.3 kg, SpO2 95 %.  Physical Exam  Nursing note and vitals reviewed. 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 IVs in both arms Stasis changes bilateral shins.   Psychiatric:flat affect   Assessment/Plan: 1. Functional deficits secondary to toxic metabolic encephalopathy which require 3+ hours per day of interdisciplinary therapy in a comprehensive inpatient rehab setting.  Physiatrist is providing close team supervision and 24 hour management of active medical problems listed below.  Physiatrist and rehab team continue to assess barriers to discharge/monitor patient progress  toward functional and medical goals  Care Tool:  Bathing    Body parts bathed by patient: Right arm, Left arm, Chest, Abdomen, Front perineal area, Buttocks, Right upper leg, Left upper leg, Face, Right lower leg, Left lower leg         Bathing assist Assist Level: Supervision/Verbal cueing     Upper Body Dressing/Undressing Upper body dressing Upper body dressing/undressing activity did not occur (including orthotics): N/A What is the patient wearing?: Pull over shirt    Upper body assist Assist Level: Set up assist    Lower Body Dressing/Undressing Lower body dressing      What is the patient wearing?: Pants, Underwear/pull up     Lower body assist Assist for lower body dressing: Supervision/Verbal cueing     Toileting Toileting    Toileting assist Assist for toileting: Supervision/Verbal cueing     Transfers Chair/bed transfer  Transfers assist     Chair/bed transfer assist level: Contact Guard/Touching assist     Locomotion Ambulation   Ambulation assist      Assist level: Supervision/Verbal cueing Assistive device: Walker-rolling Max distance: 220'   Walk 10 feet activity   Assist     Assist level: Supervision/Verbal cueing Assistive device: Walker-rolling   Walk 50 feet activity   Assist    Assist level: Supervision/Verbal cueing Assistive device: Walker-rolling    Walk 150 feet activity   Assist    Assist level: Supervision/Verbal cueing Assistive  device: Walker-rolling    Walk 10 feet on uneven surface  activity   Assist     Assist level: Contact Guard/Touching assist Assistive device: Aeronautical engineer Will patient use wheelchair at discharge?: No             Wheelchair 50 feet with 2 turns activity    Assist            Wheelchair 150 feet activity     Assist          Blood pressure (!) 144/59, pulse 72, temperature 98.3 F (36.8 C), temperature source Oral,  resp. rate 18, height 6' (1.829 m), weight 80.3 kg, SpO2 95 %.  Medical Problem List and Plan: 1.  Deficits with mobility, transfers, self-care secondary to debility with metabolic encephalopathy.             -patient may shower             -ELOS/Goals: 7-10 days/Supervision/Mod I             Continue CIR 2.  Antithrombotics: -DVT/anticoagulation:  Pharmaceutical: Heparin             -antiplatelet therapy: DAPT 3. Pain Management: Tylenol prn. Well controlled 4. Mood: LCSW to follow for evaluation and support.              -antipsychotic agents: N/A 5. Neuropsych: This patient is not fully capable of making decisions on his own behalf. 6. Skin/Wound Care: Routine pressure relief measures.  7. Fluids/Electrolytes/Nutrition: Monitor I/Os. Monitor for signs of overload- check daily weights.  Monitor renal status for recovery.    Filed Weights   12/01/19 0500 12/02/19 0406 12/04/19 0500  Weight: 79.6 kg 75.6 kg 80.3 kg   5/21- no weight for today, but yesterday weight was down 2 kg- will monitor  5/22- Weight back to 79 kg- will monitor to make sure not retaining a lot of fluid.   5/23- Pt's weight 79.6- stable from yesterday  5/25- stable weight  5/26- weight down 4 Kg- not sure if correct?  5/27- no weight today  5/28- weight up 5 kg- unlikely- will ask nurses to redo 8. Cefipime induced encephalopathy: Improving 9. Acute on chronic renal failure: Followed by Dr. Hollie Salk. SCr-40.9 today.               CMP ordered for tomorrow.   5/20- Cr stable at 4.09- was 4.07- BUN worse at 66- was 54. Renal signed off- will recheck this weekend.   5/21- Cr came back late yesterday 4.16 and now 4.27- is still rising- if hits 4.5, will call renal back  5/22- Cr up to 4.39- still rising- if increasing to 4.5- will call renal back to see if can help. Also hyperkalemic  5/23- Cr still rising 4.50 and BUN up to 87- concerned since not stopping the rise- called renal consult and spoke to Dr. Jonnie Finner -they  will see pt.   5/25- Cr down to 4.35 (4.36 yesterday)- better s/p diuresis  5/26- Cr down to 4.33 - barely moved- renal said might increase Lasix.   5/27- Lasix held this afternoon due to elevated BUN- lasix 80 mg PO starting tomorrow.  5/28- Cr down to 4.31 but BUN up to 105- needs f/u with renal in next week 10 HTN: Monitor BP tid--on Norvasc, Imdur, Hydralazine and Coreg.  5/20- BP well controlled- 120s/60s   5/23- BP slightly elevated but overall stable- con't meds  Monitor with increased mobility  5/25- BP slightly elevated 150s/70s- con't regimen for now  5/27- BP 140s/80s-  11. NSTEMI/ICM: Has significant LAD --to be treated medically with Coreg, Hydralazine, Imdur, ASA/Plavix and Lipitor. PCI if patient progresses to ESRD.            5/22- monitoring for Sx's 12. Pseudomonas UTI: Completed IV antibiotics on 05/08.  13. BPH s/p TURP 4/13: Continue Proscar. Condom cath in place with cloudy urine in tubing. Will check PVRs to monitor voiding. Toilet every 4 hours during the day.    5/20- PVRs show very low post void residuals  5/26- CBC tomorrow             Monitor for retention 14. Anemia of chronic disease: On Aranesp--increased to 171mcg/weekly today.              CBC ordered for tomorrow  5/20- Hb 7.9- aranesp just increased due to dwindling Hb.  15. Hyperkalemia  5/21- will order Lokelma 5G daily since K+ rising - was 5.0, then 5.4, then now 5.6-   5/22-will Increase lokelma to 10g and added a dose of Kayexylate to get it down faster   5/23- K= only down to 5.6 after 5 BMs yesterday- have called renal for assistance.  5/25- K+ down to 4.3- doing much better  5/26- K+ 4.2 16. DM- A1c 8.5-   5/21- will start SSI and BG checks- will d/w pharmacy about meds considering rising renal function.   CBG (last 3)  Recent Labs    12/03/19 1725 12/03/19 2115 12/04/19 0633  GLUCAP 218* 201* 106*    5/22- BGs vAriable- but cannot add Oral agent due to rising Cr- will  monitor for another day and add insulin dosing based on BGs.   5/23- BGs overall look good, actually- will con't to monitor.   5/24: CBGs little elevated last night, well controlled today.  5/25- BGs better- 239-532- con't regimen  5/27- BGs 116-156- great control- con't regimen  5/28- BGs variable- but stable- send home on regimen and have PCP determine plan, since cannot take oral agents.    Continue to monitor.  17. Anemia: Hgb 8.6; stable.  18. Insomnia: started Melatonin.  5/26- sleeping better- con't meds  19. Dispo  5/27- have PCP determine DM regimen since cannot do Oral agent and f/u with renal ASAP after d/c.  5/28- will give sorbitol to get pt to have BM before d/c.  LOS: 9 days A FACE TO FACE EVALUATION WAS PERFORMED  Shiraz Bastyr 12/04/2019, 8:58 AM

## 2019-12-04 NOTE — Progress Notes (Signed)
Inpatient Rehabilitation Care Coordinator  Discharge Note  The overall goal for the admission was met for:   Discharge location: Yes.  D/c to home with his wife.   Length of Stay: Yes. 9 days.   Discharge activity level: Yes. Supervision  Home/community participation: Yes. Limited.   Services provided included: MD, RD, PT, OT, SLP, RN, CM, TR, Pharmacy, Neuropsych and SW  Financial Services: Private Insurance: [employer]  Follow-up services arranged: Home Health: [facility] for PT/OT/ST/SN (resumption care) and DME: [employer] [facility] for tub transfer bench (private purchase)  Comments (or additional information): contact pt s/o [name] #[phone]  Patient/Family verbalized understanding of follow-up arrangements: Yes  Individual responsible for coordination of the follow-up plan: Pt to have assistance coordinating his care needs from wife/family  Confirmed correct DME delivered:  A  12/04/2019     A  

## 2019-12-04 NOTE — Plan of Care (Signed)
  Problem: RH BOWEL ELIMINATION Goal: RH STG MANAGE BOWEL WITH ASSISTANCE Description: STG Manage Bowel with min assist   Outcome: Completed/Met Goal: RH STG MANAGE BOWEL W/MEDICATION W/ASSISTANCE Description: STG Manage Bowel with Medication with min Assistance. Outcome: Completed/Met   Problem: RH BLADDER ELIMINATION Goal: RH STG MANAGE BLADDER WITH ASSISTANCE Description: STG Manage Bladder With min assist   Outcome: Completed/Met   Problem: RH SAFETY Goal: RH STG ADHERE TO SAFETY PRECAUTIONS W/ASSISTANCE/DEVICE Description: STG Adhere to Safety Precautions With supervision   Outcome: Completed/Met   Problem: RH COGNITION-NURSING Goal: RH STG USES MEMORY AIDS/STRATEGIES W/ASSIST TO PROBLEM SOLVE Description: STG Uses Memory Aids/Strategies With supervision to Problem Solve. Outcome: Completed/Met   Problem: Consults Goal: RH STROKE PATIENT EDUCATION Description: See Patient Education module for education specifics  Outcome: Completed/Met

## 2019-12-04 NOTE — Progress Notes (Signed)
Patient discharged stable with family member

## 2019-12-06 DIAGNOSIS — N184 Chronic kidney disease, stage 4 (severe): Secondary | ICD-10-CM | POA: Diagnosis not present

## 2019-12-06 DIAGNOSIS — N049 Nephrotic syndrome with unspecified morphologic changes: Secondary | ICD-10-CM | POA: Diagnosis not present

## 2019-12-06 DIAGNOSIS — I69354 Hemiplegia and hemiparesis following cerebral infarction affecting left non-dominant side: Secondary | ICD-10-CM | POA: Diagnosis not present

## 2019-12-06 DIAGNOSIS — D510 Vitamin B12 deficiency anemia due to intrinsic factor deficiency: Secondary | ICD-10-CM | POA: Diagnosis not present

## 2019-12-06 DIAGNOSIS — I130 Hypertensive heart and chronic kidney disease with heart failure and stage 1 through stage 4 chronic kidney disease, or unspecified chronic kidney disease: Secondary | ICD-10-CM | POA: Diagnosis not present

## 2019-12-06 DIAGNOSIS — I255 Ischemic cardiomyopathy: Secondary | ICD-10-CM | POA: Diagnosis not present

## 2019-12-06 DIAGNOSIS — Z466 Encounter for fitting and adjustment of urinary device: Secondary | ICD-10-CM | POA: Diagnosis not present

## 2019-12-06 DIAGNOSIS — E1122 Type 2 diabetes mellitus with diabetic chronic kidney disease: Secondary | ICD-10-CM | POA: Diagnosis not present

## 2019-12-06 DIAGNOSIS — I5041 Acute combined systolic (congestive) and diastolic (congestive) heart failure: Secondary | ICD-10-CM | POA: Diagnosis not present

## 2019-12-08 ENCOUNTER — Encounter: Payer: Self-pay | Admitting: Internal Medicine

## 2019-12-08 ENCOUNTER — Telehealth: Payer: Self-pay | Admitting: *Deleted

## 2019-12-08 DIAGNOSIS — N400 Enlarged prostate without lower urinary tract symptoms: Secondary | ICD-10-CM | POA: Insufficient documentation

## 2019-12-08 DIAGNOSIS — I255 Ischemic cardiomyopathy: Secondary | ICD-10-CM | POA: Diagnosis not present

## 2019-12-08 DIAGNOSIS — N179 Acute kidney failure, unspecified: Secondary | ICD-10-CM

## 2019-12-08 DIAGNOSIS — N049 Nephrotic syndrome with unspecified morphologic changes: Secondary | ICD-10-CM | POA: Diagnosis not present

## 2019-12-08 DIAGNOSIS — I69354 Hemiplegia and hemiparesis following cerebral infarction affecting left non-dominant side: Secondary | ICD-10-CM | POA: Diagnosis not present

## 2019-12-08 DIAGNOSIS — I5041 Acute combined systolic (congestive) and diastolic (congestive) heart failure: Secondary | ICD-10-CM | POA: Diagnosis not present

## 2019-12-08 DIAGNOSIS — I130 Hypertensive heart and chronic kidney disease with heart failure and stage 1 through stage 4 chronic kidney disease, or unspecified chronic kidney disease: Secondary | ICD-10-CM | POA: Diagnosis not present

## 2019-12-08 DIAGNOSIS — D510 Vitamin B12 deficiency anemia due to intrinsic factor deficiency: Secondary | ICD-10-CM | POA: Diagnosis not present

## 2019-12-08 DIAGNOSIS — Z466 Encounter for fitting and adjustment of urinary device: Secondary | ICD-10-CM | POA: Diagnosis not present

## 2019-12-08 DIAGNOSIS — N184 Chronic kidney disease, stage 4 (severe): Secondary | ICD-10-CM | POA: Diagnosis not present

## 2019-12-08 DIAGNOSIS — E1122 Type 2 diabetes mellitus with diabetic chronic kidney disease: Secondary | ICD-10-CM | POA: Diagnosis not present

## 2019-12-08 NOTE — Discharge Summary (Signed)
Physician Discharge Summary  Patient ID: Dennis Macias MRN: [id number] DOB/AGE: [birth year] 69 y.o.  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 Kerney Elbe Date: 11/12/2019 Duration: 24.37 mins Patient history: 69 year old male with progressive decline in cognitive function over past 24 hours.  EEG evaluate for seizures. Level of alertness: awake AEDs during EEG study: None Technical aspects: This EEG study was done with scalp electrodes positioned according to the 10-20 International system of electrode placement. Electrical activity was acquired at a sampling rate of 500Hz and reviewed with a high frequency filter of 70Hz and a low frequency filter of 1Hz. EEG data were recorded continuously and digitally stored. Description: No clear posterior dominant was seen. EEG showed continuous generalized 3 to 5 cm in delta slowing.  Triphasic waves, generalized, maximal bifrontal were also seen.  Hyperventilation and photic stimulation were not performed. Abnormality - Continuous slow, generalized - Triphasic waves, generalized, maximal bifrontal IMPRESSION: This study is suggestive of moderate diffuse encephalopathy, nonspecific etiology but likely secondary to toxic-metabolic causes. No seizures or definite epileptiform discharges were seen throughout the recording. Lora Havens   CT HEAD WO CONTRAST  Result Date: 11/11/2019 CLINICAL DATA:  Neuro deficit. Stroke suspected. EXAM: CT  HEAD WITHOUT CONTRAST TECHNIQUE: Contiguous axial images were obtained from the base of the skull through the vertex without intravenous contrast. COMPARISON:  07/31/2019 FINDINGS: Brain: No evidence of acute infarction, hemorrhage, hydrocephalus, extra-axial collection or mass lesion/mass effect. Atrophy and advanced chronic microvascular ischemic changes are again noted. Vascular: No hyperdense vessel or unexpected calcification. Skull: Normal. Negative for fracture or focal lesion. Sinuses/Orbits: No acute finding. Other: None. IMPRESSION: 1. No acute intracranial abnormality. 2. Atrophy and advanced chronic microvascular ischemic changes. Electronically Signed   By: Constance Holster M.D.   On: 11/11/2019 15:15   MR ANGIO HEAD WO CONTRAST  Result Date: 11/13/2019 CLINICAL DATA:  Stroke. EXAM: MRA HEAD WITHOUT CONTRAST TECHNIQUE: Angiographic images of the Circle of Willis were obtained using MRA technique without intravenous contrast. COMPARISON:  2017 FINDINGS: Motion artifact is present. Intracranial internal carotid arteries are patent with atherosclerotic irregularity. Middle and anterior cerebral arteries are patent. Left A1 ACA is dominant. Stable tiny outpouching near the expected location an anterior communicating artery. This is unchanged. Included intracranial vertebral arteries, basilar artery, posterior cerebral arteries are patent. There is no significant stenosis identified. IMPRESSION: No proximal intracranial vessel occlusion or new stenosis. Unchanged tiny vascular protrusion from the expected region an anterior communicating artery. This may reflect a tiny aneurysm or could be contiguous with unseen diminutive right A1 ACA. Electronically Signed   By: Macy Mis M.D.   On: 11/13/2019 12:23   MR BRAIN WO CONTRAST  Result Date: 11/12/2019 CLINICAL DATA:  Encephalopathy. Additional provided: Altered mental status. EXAM: MRI HEAD WITHOUT CONTRAST TECHNIQUE: Multiplanar, multiecho pulse  sequences of the brain and surrounding structures were obtained without intravenous contrast. COMPARISON:  Noncontrast head CT 11/11/2019, brain MRI 11/12/2015. FINDINGS: Brain: Multiple sequences are significantly motion degraded, limiting evaluation. Most notably, there is  moderate/severe motion degradation of the axial T2/FLAIR sequence, moderate/severe motion degradation of the coronal T2 weighted sequence and moderate motion degradation of the sagittal T1 weighted sequence. There is a 10 mm focus of restricted diffusion consistent with acute infarction with left parietal white matter (series 4, image 16). Advanced patchy and confluent T2/FLAIR hyperintensity within the cerebral white matter is nonspecific, but consistent with chronic small vessel ischemic disease. Findings have progressed as compared to prior MRI 09/12/2015. Chronic small vessel ischemic changes within the basal ganglia have also progressed. There is a tiny chronic left thalamic lacunar infarct which was not present on the prior MRI. There are few scattered punctate chronic microhemorrhages within bilateral cerebral hemispheres, nonspecific. Stable, mild generalized parenchymal atrophy. Vascular: Expected proximal arterial flow voids. Skull and upper cervical spine: No focal marrow lesion. Sinuses/Orbits: Visualized orbits show no acute finding. Minimal ethmoid and maxillary sinus mucosal thickening. No significant mastoid effusion. IMPRESSION: 1. Significantly motion degraded examination as described. 2. 10 mm acute infarct within the left parietal lobe subcortical white matter. 3. Advanced chronic small vessel ischemic changes most notably within the cerebral white matter, progressed as compared to MRI 09/12/2015. A chronic left thalamic lacunar infarct was not present on this prior exam. 4. Stable, mild generalized parenchymal atrophy. 5. Minimal ethmoid and maxillary sinus mucosal thickening. Electronically Signed   By: Kellie Simmering DO    On: 11/12/2019 12:29   CARDIAC CATHETERIZATION  Result Date: 11/19/2019  Mid LAD lesion is 75% stenosed.  Ost LAD to Prox LAD lesion is 80% stenosed. In the cranial views, it is seen that the disease extends up to the ostial LAD, and there is significant calcification.  LV end diastolic pressure is normal.  There is no aortic valve stenosis.  PCI of the proximal - ostial LAD would likely require IVUS with possible atherectomy.  There is a mid to distal LAD lesion which is eccentric.  Given his lack of angina and issues with renal insufficiency, would not pursue PCI at this time.  Would reconsider based on his renal function.    US RENAL  Result Date: 11/29/2019 CLINICAL DATA:  Acute renal insufficiency. EXAM: RENAL / URINARY TRACT ULTRASOUND COMPLETE COMPARISON:  July 14, 2019 FINDINGS: Right Kidney: Renal measurements: 11.0 x 4.6 x 4.7 cm = volume: 125 mL . Echogenicity within normal limits. No mass or hydronephrosis visualized. Tiny amount of perinephric fluid. Left Kidney: Renal measurements: 11.1 x 5.1 x 5.2 cm = volume: 154 mL. Echogenicity within normal limits. No mass or hydronephrosis visualized. Lower pole echogenic lesion and nonobstructive midpolar calculus are not seen on today's exam. Tiny amount of perinephric fluid. Bladder: Decompressed. Other: Bilateral pleural effusions. Small volume abdominal ascites. IMPRESSION: Normal appearance of the kidneys apart from tiny amount of perinephric fluid bilaterally. Bilateral pleural effusions. Small volume abdominal ascites. Electronically Signed   By: Fidela Salisbury M.D.   On: 11/29/2019 19:11   DG CHEST PORT 1 VIEW  Result Date: 11/29/2019 CLINICAL DATA:  Chest pain and shortness of breath. EXAM: PORTABLE CHEST 1 VIEW COMPARISON:  Nov 07, 2019 FINDINGS: Enlarged cardiac silhouette. Calcific atherosclerotic disease of the aorta. Mediastinal contours appear intact. Bilateral layering pleural effusions. Pulmonary vascular congestion.  Osseous structures are without acute abnormality. Soft tissues are grossly normal. IMPRESSION: 1. Bilateral layering pleural effusions with pulmonary vascular congestion. 2. Calcific atherosclerotic disease of the aorta. 3. Enlarged cardiac silhouette. Electronically Signed   By: Fidela Salisbury M.D.   On: 11/29/2019 18:19   VAS US  CAROTID  Result Date: 11/16/2019 Carotid Arterial Duplex Study Indications:       CVA and Syncope. Risk Factors:      Hypertension, coronary artery disease. Comparison Study:  carotid 09/13/2015 - 1-39% Performing Technologist: Velva Harman Sturdivant RDMS, RVT  Examination Guidelines: A complete evaluation includes B-mode imaging, spectral Doppler, color Doppler, and power Doppler as needed of all accessible portions of each vessel. Bilateral testing is considered an integral part of a complete examination. Limited examinations for reoccurring indications may be performed as noted.  Right Carotid Findings: +----------+--------+--------+--------+------------------+---------------------+           PSV cm/sEDV cm/sStenosisPlaque DescriptionComments              +----------+--------+--------+--------+------------------+---------------------+ CCA Prox  76      9                                                       +----------+--------+--------+--------+------------------+---------------------+ CCA Distal91      11                                intimal thickening    +----------+--------+--------+--------+------------------+---------------------+ ICA Prox  154     19      1-39%   calcific          elevated PSV with                                                         significant stenosis                                                      noted                 +----------+--------+--------+--------+------------------+---------------------+ ICA Mid   95      19      1-39%                                            +----------+--------+--------+--------+------------------+---------------------+ ICA Distal40      13                                                      +----------+--------+--------+--------+------------------+---------------------+ ECA       199                                                             +----------+--------+--------+--------+------------------+---------------------+ +----------+--------+-------+----------------+-------------------+           PSV cm/sEDV cmsDescribe  Arm Pressure (mmHG) +----------+--------+-------+----------------+-------------------+ Subclavian130            Multiphasic, WNL                    +----------+--------+-------+----------------+-------------------+ +---------+--------+--+--------+--+---------+ VertebralPSV cm/s64EDV cm/s23Antegrade +---------+--------+--+--------+--+---------+  Left Carotid Findings: +----------+--------+--------+--------+------------------+---------------------+           PSV cm/sEDV cm/sStenosisPlaque DescriptionComments              +----------+--------+--------+--------+------------------+---------------------+ CCA Prox  75      13                                                      +----------+--------+--------+--------+------------------+---------------------+ CCA Distal178     31              hypoechoic        elevated PSV without                                                      significant stenosis  +----------+--------+--------+--------+------------------+---------------------+ ICA Prox  107     18      1-39%   hypoechoic                              +----------+--------+--------+--------+------------------+---------------------+ ICA Distal62      15                                                      +----------+--------+--------+--------+------------------+---------------------+ ECA       81                                                               +----------+--------+--------+--------+------------------+---------------------+ +----------+--------+--------+----------------+-------------------+           PSV cm/sEDV cm/sDescribe        Arm Pressure (mmHG) +----------+--------+--------+----------------+-------------------+ [id number]             Multiphasic, WNL                    +----------+--------+--------+----------------+-------------------+ +---------+--------+--+--------+--+---------+ VertebralPSV cm/s32EDV cm/s11Antegrade +---------+--------+--+--------+--+---------+   Summary: Right Carotid: Velocities in the right ICA are consistent with a 1-39% stenosis. Left Carotid: Velocities in the left ICA are consistent with a 1-39% stenosis. Vertebrals:  Bilateral vertebral arteries demonstrate antegrade flow. Subclavians: Normal flow hemodynamics were seen in bilateral subclavian              arteries. *See table(s) above for measurements and observations.  Electronically signed by Antony Contras MD on 11/16/2019 at 2:07:23 PM.    Final     Labs:  Basic Metabolic Panel: Recent Labs  Lab 12/02/19 0533 12/03/19 0619 12/04/19 0622  NA 136 135 135  K 4.2 4.3 4.4  CL 100 99 101  CO2 _0 GLUCOSE 122* 123*  112*  BUN 96* 103* 105*  CREATININE 4.33* 4.37* 4.31*  CALCIUM 8.7* 8.6* 8.3*  PHOS 4.3 4.5 4.3    CBC: CBC Latest Ref Rng & Units 12/03/2019 11/30/2019 11/26/2019  WBC 4.0 - 10.5 K/uL 6.1 4.6 5.4  Hemoglobin 13.0 - 17.0 g/dL 8.2(L) 8.6(L) 8.5(L)  Hematocrit 39.0 - 52.0 % 25.7(L) 27.1(L) 26.7(L)  Platelets 150 - 400 K/uL 185 212 200    CBG: Recent Labs  Lab 12/03/19 0627 12/03/19 1203 12/03/19 1725 12/03/19 2115 12/04/19 0633  GLUCAP 116* 157* 218* 201* 106*    Brief HPI:   Dennis Macias is a 69 y.o. RH- male with history of HTN< T2DM, BPH s/p TURP with foley, CVA, CKD-baseline SCr 3.3-3.7 as well as recent admission for anasarca s/p thoracocentesis and IV diuresis; who was admitted on 11/06/19 with  syncope and found to have NSTEMI as well as PVC with NSVT.  He developed fevers with chills past admission and was started with cefepime for bacteremia due to Pseudomonas UTI.  He had progressive confusion and on 05/05 he was noted to have BUE tremors due to metabolic encephalopathy.  He was found to have global aphasia on 05/06 and MRI/MRA brain done revealing 10 mm left parietal lobe subcortical infarct.  EEG was suggestive of moderate diffuse nonspecific encephalopathy.  Neurology consult for input and felt that the mental status and speech issues were not due to stroke and rather encephalopathy was related to cefepime therefore antibiotics were changed to [facility].   Stroke was felt to be incidental findings due to small vessel disease.  Dr. Leonie Man recommended 30-day cardiac event monitor to rule out PAF.  Cardiology expressed concerns of ventricular tachycardia in setting of cardiomyopathy could be cause of syncope.  Nephrology was consulted for input on acute on chronic renal failure this was resolving with IV fluids for hydration.  He underwent cardiac cath on 05/13 revealing 75% stenosis in mid LAD and 80% stenosis ostial LAD to proximal LAD.  Serum creatinine did peak to 4.10 post cath and as patient without chest pain, medical therapy was recommended.  To consider PCI if he progressed to HD.  Anemia of chronic disease was being monitored and Aranesp was increased due to drop in H/H to 7.9/25.1.  His mentation was improving and agitation had resolved.  Therapy was ongoing and CIR was recommended due to functional decline   Hospital Course: Dennis Macias was admitted to rehab 11/25/2019 for inpatient therapies to consist of PT, ST and OT at least three hours five days a week. Past admission physiatrist, therapy team and rehab RN have worked together to provide customized collaborative inpatient rehab. Cefepime induced encephalopathy is improving.  Voiding was monitored with PVR checks and shows no signs of  retention.  Bowel program was augmented to help manage constipation however he has frequently used laxatives during his stay. His blood pressures were monitored on TID basis and is showing improvement in control.  His diabetes has been monitored with ac/hs CBG checks and SSI was use prn for tighter BS control.  Blood sugars are improving and he is to follow up with PCP for input on hypoglycemic regimen after discharge.   Nephrology was consulted due to acute on chronic renal failure with signs of overload as well as hyperkalemia.  Dr. Johnney Ou felt that this was cardiorenal in nature as renal ultrasound without obstruction and patient was voiding without issues.  She felt that he would need AV access if renal status does not improve on  follow up and to increase in lasix to 80 mg bid at discharge. Acute on chronic systolic CHF is compensating and weights have been stable overall. He was advised to maintain low salt diet and is to follow up with nephrology closely after discharge.  Serial CBC showed H/H is relatively stable on Aranesp with recommendations to continue this every 2 weeks after discharge.  He has made gains during rehab stay and supervision is recommended due to safety concerns. [place] to resume and provide PT, OT and aide after discharge. .   Rehab course: During patient's stay in rehab weekly team conferences were held to monitor patient's progress, set goals and discuss barriers to discharge. At admission, patient required min to mod assist with ADL tasks and min assist with mobility. Speech and cognition were felt to be back to baseline per input from family and no follow up needed during his stay. He  has had improvement in activity tolerance, balance, postural control as well as ability to compensate for deficits.  He requires supervision for transfers and to ambulate 220' with RW and cues for sequencing/technique.     Discharge disposition: 01-Home or Self Care  Diet:  Carb  modified.  Low salt diet. Limit fluids to 1200 cc/day.   Special Instructions: 1. Follow up with nephrology for repeat labs and monitoring renal status in a week. 2. No diving or strenuous activity till cleared by MD.   Discharge Instructions    Ambulatory referral to Neurology   Complete by: As directed    An appointment is requested in approximately 4 weeks toxic metabolic encephalopathy   Ambulatory referral to Physical Medicine Rehab   Complete by: As directed    Moderate complexity follow-up 1 to 2 weeks encephalopathy     Allergies as of 12/04/2019   No Known Allergies     Medication List    STOP taking these medications   hydrALAZINE 25 MG tablet Commonly known as: APRESOLINE   metoprolol succinate 50 MG 24 hr tablet Commonly known as: TOPROL-XL     TAKE these medications   Accu-Chek FastClix Lancets Misc Check blood sugar up to 7 times a week as instructed What changed:   how much to take  how to take this  when to take this   Accu-Chek Guide test strip Generic drug: glucose blood Check blood sugar up to 7 times a week as instructed What changed:   how much to take  how to take this  when to take this   Accu-Chek Guide w/Device Kit 1 each by Does not apply route daily. Check blood sugar as instructed up to 7 times a week What changed: when to take this   acetaminophen 325 MG tablet Commonly known as: TYLENOL Take 1-2 tablets (325-650 mg total) by mouth every 4 (four) hours as needed for mild pain.   amLODipine 5 MG tablet Commonly known as: NORVASC Take 1 tablet (5 mg total) by mouth daily.   aspirin EC 81 MG tablet Take 1 tablet (81 mg total) by mouth daily.   atorvastatin 80 MG tablet Commonly known as: LIPITOR Take 1 tablet (80 mg total) by mouth at bedtime. IM program   carvedilol 25 MG tablet Commonly known as: COREG Take 1 tablet (25 mg total) by mouth 2 (two) times daily with a meal.   clopidogrel 75 MG tablet Commonly known  as: PLAVIX Take 1 tablet (75 mg total) by mouth daily.   dorzolamide-timolol 22.3-6.8 MG/ML ophthalmic solution Commonly  known as: COSOPT Place 1 drop into the right eye 2 (two) times daily.   ferrous gluconate 324 MG tablet Commonly known as: FERGON Take 1 tablet (324 mg total) by mouth daily with breakfast.   finasteride 5 MG tablet Commonly known as: PROSCAR Take 1 tablet (5 mg total) by mouth daily.   furosemide 80 MG tablet Commonly known as: LASIX Take 1 tablet (80 mg total) by mouth daily.   isosorbide mononitrate 60 MG 24 hr tablet Commonly known as: IMDUR Take 1 tablet (60 mg total) by mouth daily.   melatonin 3 MG Tabs tablet Take 1 tablet (3 mg total) by mouth at bedtime.   tamsulosin 0.4 MG Caps capsule Commonly known as: FLOMAX Take 1 capsule (0.4 mg total) by mouth daily after supper.   vitamin B-12 1000 MCG tablet Commonly known as: CYANOCOBALAMIN Take 1 tablet (1,000 mcg total) by mouth daily.      Follow-up Information    Lovorn, Jinny Blossom, MD Follow up.   Specialty: Physical Medicine and Rehabilitation Why: office to call for appointment Contact information: [address] [place] [address] [phone]        Lelon Perla, MD Follow up.   Specialty: Cardiology Why: call for appointment Contact information: [place] [address] [phone]        Roney Jaffe, MD Follow up.   Specialty: Nephrology Why: call for appointment Contact information: [place] [address] [phone]        [name], MD Follow up.   Specialty: Internal Medicine Contact information: [address] [place] [address] [phone]        Jettie Booze, MD .   Specialties: Cardiology, Radiology, Interventional Cardiology Contact information: [address] [address] [phone]           Signed: Bary Leriche 12/08/2019,  11:54 PM

## 2019-12-08 NOTE — Telephone Encounter (Signed)
PT has resumed care at home started Sunday 2x week for 2 weeks, then recert for strengthening, safety VO given Turned down speech therapy Will have PT, OT and HHN Do you agree?

## 2019-12-08 NOTE — Progress Notes (Signed)
CC: hospital follow up-- NSTEMI, CKD IV, CVA  HPI:  [name] is a 69 y.o. male who presented to [facility] for hospital follow up.  Hospitalized 11/06/19-11/25/19. Initially presented with a syncopal episode. Found to have some non-sustained VTach on admission. Cardiology was consulted and wanted pt to undergo LHC however this ended up being deferred until renal function improved due to his severe renal disease with a superimposed AKI at time of admission.  While awaiting improvement in renal function, he was found to have pseudomonas bacteremia and UTI which was treated with cefepime however, unfortunately, he developed cefepime neurotoxicity. Head imaging at that time also incidentally noticed an acute CVA of his parietal lobe. No focal neurologic changes were appreciated however. Upon resolution of the neurotoxicity, no focal deficits were apparent. He eventually underwent LHC on 5/13 which revealed non-obstructive disease. Due to his extended hospitalization, he developed worsening weakness from his baseline and was admitted to CIR on 5/19-5/28.  Since returning home, he and Dennis Macias are noting significant improvement in strength. Dennis Macias is relaying some concern however due to having to leave him alone while she is at work. She works about 6 hours a day and does occasionally have other family available to help with watching him however is not consistent. We discussed options including hiring private help however this is not financially feasible for them. We also discussed adding on a home health aide which she was happy to try.  Dennis Macias has no complaints today and states he is feeling well.   Past Medical History:  Diagnosis Date  . Anemia   . Arthritis    past hx   . Blindness    right eye  . Cardiorenal syndrome   . Cataract    removed both eyes  . Dehydration   . Diabetes ([place])   . Glaucoma   . History of CVA (cerebrovascular accident) 09/13/2015  . History of stroke 09/13/2015  . History of  urinary retention   . Hyperlipidemia   . Hypertension   . NSTEMI (non-ST elevated myocardial infarction) ([place])   . Pernicious anemia 02/24/2018  . S/P TURP   . Stroke [facility])    2017- March  . Syncope 11/2019  . Tachycardia 08/26/2017  . Tubular adenoma of colon 02/2017  . Weight loss, non-intentional 08/26/2017   10 lbs between 6/18 & 2/19    Review of Systems:  Review of Systems - General ROS: negative for - fever Cardiovascular ROS: no chest pain or dyspnea on exertion Neurological ROS: no TIA or stroke symptoms   Physical Exam:  Vitals:   12/10/19 1343  BP: 137/65  Pulse: (!) 59  Temp: 98.4 F (36.9 C)  TempSrc: Oral  SpO2: 99%  Weight: 174 lb 8 oz (79.2 kg)    GENERAL: well appearing, in no apparent distress CARDIAC: heart regular rate and rhythm. +1 LE edema PULMONARY: lung sounds clear to auscultation NEURO: CN II-XII grossly intact. 5/5 strength in bilateral upper and lower extremities.    Assessment & Plan:   Dennis Macias is presenting for follow up for his extended hospitalization 4/30-5/19 for the following issues:  NSTEMI. LHC showing 75% stenosis to mid LAD and 80% stenosis of ost LAD to prox LAD. Stenting deferred due to being asymptomatic and poor renal function. Suspect this was a type II NSTEMI in the setting of infection and volume overload. Plan: continue plavix and aspirin  CVA of left ICA resulting in small parietal infarct. No residual deficits.  Plan --continue plavix  and aspirin --follow up with neurology on 12/21/19.   Physical deconditioning. Admitted to inpatient rehab 5/19-5/28 and has since returned home. Strength significantly improved since discharge. His significant other, Dennis Macias, is concerned about sufficient supervision at home while she is at work. Discussed with clinic CCM who briefly met with Dennis Macias and Dennis Macias today and will plan to continue discussions to look for more resources. Plan: continue home health PT/OT and RN. Will add on a home health  aide to assist as well. CCM to continue to follow  Hypertension. Blood pressures difficult to control prior to admission. Current medications: 35m metoprolol, 784mhydralazine tid, 6068mmdur, 27m13msix  Blood pressure 137/65 in the office today.  Plan: continue current management.  CKD IV. [facility] renal function at today's visit--GFR 15 Anemia of CKD. Hgb stable at 9.6 today. Plan: Continue following with Dr. UptoHollie SalkPatient is in agreement with the plan and endorses no further questions at this time.  Patient discussed with Dr. GuilVenetia Maxon Internal Medicine Resident-PGY1 12/14/19

## 2019-12-08 NOTE — Telephone Encounter (Signed)
I agree

## 2019-12-10 ENCOUNTER — Ambulatory Visit: Payer: Medicare PPO | Admitting: Internal Medicine

## 2019-12-10 ENCOUNTER — Ambulatory Visit: Payer: Self-pay

## 2019-12-10 VITALS — BP 137/65 | HR 59 | Temp 98.4°F | Wt 174.5 lb

## 2019-12-10 DIAGNOSIS — I63232 Cerebral infarction due to unspecified occlusion or stenosis of left carotid arteries: Secondary | ICD-10-CM | POA: Diagnosis not present

## 2019-12-10 DIAGNOSIS — D638 Anemia in other chronic diseases classified elsewhere: Secondary | ICD-10-CM

## 2019-12-10 DIAGNOSIS — I2510 Atherosclerotic heart disease of native coronary artery without angina pectoris: Secondary | ICD-10-CM | POA: Diagnosis not present

## 2019-12-10 DIAGNOSIS — I5042 Chronic combined systolic (congestive) and diastolic (congestive) heart failure: Secondary | ICD-10-CM | POA: Diagnosis not present

## 2019-12-10 DIAGNOSIS — N184 Chronic kidney disease, stage 4 (severe): Secondary | ICD-10-CM

## 2019-12-10 DIAGNOSIS — I255 Ischemic cardiomyopathy: Secondary | ICD-10-CM | POA: Diagnosis not present

## 2019-12-10 DIAGNOSIS — I10 Essential (primary) hypertension: Secondary | ICD-10-CM

## 2019-12-10 MED ORDER — FUROSEMIDE 80 MG PO TABS: 80.0000 mg | ORAL_TABLET | Freq: Two times a day (BID) | ORAL | 0 refills | Status: DC

## 2019-12-10 NOTE — Progress Notes (Signed)
Internal Medicine Clinic Resident  I have personally reviewed this encounter including the documentation in this note and/or discussed this patient with the care management provider. I will address any urgent items identified by the care management provider and will communicate my actions to the patient's PCP. I have reviewed the patient's CCM visit with my supervising attending, Dr Butcher.  Ceri Mayer, MD 12/10/2019    

## 2019-12-10 NOTE — Progress Notes (Signed)
Internal Medicine Clinic Attending  CCM services provided by the care management provider and their documentation were discussed with Dr. Sheppard Coil. We reviewed the pertinent findings, urgent action items addressed by the resident and non-urgent items to be addressed by the PCP.  I agree with the assessment, diagnosis, and plan of care documented in the CCM and resident's note.  Larey Dresser, MD 12/10/2019

## 2019-12-10 NOTE — Chronic Care Management (AMB) (Signed)
  Care Management   Social Work Note  12/10/2019 Name: Rydge Texidor MRN: [id number] DOB: 1951/01/28  Maycol Hoying is a 69 y.o. year old male who sees [name], MD for primary care. The Care Management team was consulted for assistance with disease management and resources for in home care.   SDOH (Social Determinants of Health) assessments performed: No     Met with patient and spouse as requested by Dr. Darrick Meigs.  Discussed specifics of nursing and social work support provided by [employer].  Patient/spouse were mostly concerned with obtaining in home aide services. Spouse has had to cut back on work hours to assist with patient's care.  Spouse did state that they have a supportive family network but could still use assistance.   Informed them that these services are only covered by Medicare while in conjunction with skilled services.  Informed patient/spouse that personal care services are only covered by Medicaid.  Spouse reports that patient applied for Medicaid "years ago" but was denied because her income was taken into consideration.  Offered assistance with applying for Medicaid again but patient/spouse declined. Informed them that they can contact Department of Social Services to be added to wait list for [facility].  Informed them that, unfortunately, the wait list is typically several years long.  Private pay is only option for long term aide services at this time.  Attempted to schedule follow up for completion of full social work and nursing assessments but CCM services were declined.  Provided patient/spouse with contact information for CCM team.  Encouraged patient to call if he changes his mind about services.        Ronn Melena, [place] Coordination Social Worker Interior [phone]

## 2019-12-11 ENCOUNTER — Telehealth: Payer: Self-pay | Admitting: Internal Medicine

## 2019-12-11 LAB — BASIC METABOLIC PANEL
BUN/Creatinine Ratio: 21 (ref 10–24)
BUN: 80 mg/dL (ref 8–27)
CO2: 22 mmol/L (ref 20–29)
Calcium: 8.6 mg/dL (ref 8.6–10.2)
Chloride: 100 mmol/L (ref 96–106)
Creatinine, Ser: 3.84 mg/dL — ABNORMAL HIGH (ref 0.76–1.27)
GFR calc Af Amer: 17 mL/min/{1.73_m2} — ABNORMAL LOW (ref >59)
GFR calc non Af Amer: 15 mL/min/{1.73_m2} — ABNORMAL LOW (ref >59)
Glucose: 243 mg/dL — ABNORMAL HIGH (ref 65–99)
Potassium: 5.1 mmol/L (ref 3.5–5.2)
Sodium: 137 mmol/L (ref 134–144)

## 2019-12-11 LAB — CBC
Hematocrit: 28.4 % — ABNORMAL LOW (ref 37.5–51.0)
Hemoglobin: 9.6 g/dL — ABNORMAL LOW (ref 13.0–17.7)
MCH: 30 pg (ref 26.6–33.0)
MCHC: 33.8 g/dL (ref 31.5–35.7)
MCV: 89 fL (ref 79–97)
Platelets: 285 10*3/uL (ref 150–450)
RBC: 3.2 x10E6/uL — ABNORMAL LOW (ref 4.14–5.80)
RDW: 14.5 % (ref 11.6–15.4)
WBC: 5.5 10*3/uL (ref 3.4–10.8)

## 2019-12-11 NOTE — Telephone Encounter (Signed)
Dennis Macias from [place] wants to let the physician pt misses appt

## 2019-12-14 ENCOUNTER — Encounter: Payer: Self-pay | Admitting: Physical Medicine and Rehabilitation

## 2019-12-14 ENCOUNTER — Encounter: Payer: Medicare PPO | Attending: Physical Medicine and Rehabilitation | Admitting: Physical Medicine and Rehabilitation

## 2019-12-14 ENCOUNTER — Encounter: Payer: Self-pay | Admitting: Internal Medicine

## 2019-12-14 ENCOUNTER — Other Ambulatory Visit: Payer: Self-pay

## 2019-12-14 VITALS — BP 159/75 | HR 64 | Temp 98.7°F | Ht 72.0 in | Wt 170.0 lb

## 2019-12-14 DIAGNOSIS — R269 Unspecified abnormalities of gait and mobility: Secondary | ICD-10-CM | POA: Insufficient documentation

## 2019-12-14 DIAGNOSIS — G9341 Metabolic encephalopathy: Secondary | ICD-10-CM | POA: Insufficient documentation

## 2019-12-14 DIAGNOSIS — I2510 Atherosclerotic heart disease of native coronary artery without angina pectoris: Secondary | ICD-10-CM | POA: Insufficient documentation

## 2019-12-14 DIAGNOSIS — E872 Acidosis: Secondary | ICD-10-CM | POA: Diagnosis not present

## 2019-12-14 DIAGNOSIS — I129 Hypertensive chronic kidney disease with stage 1 through stage 4 chronic kidney disease, or unspecified chronic kidney disease: Secondary | ICD-10-CM | POA: Diagnosis not present

## 2019-12-14 DIAGNOSIS — N184 Chronic kidney disease, stage 4 (severe): Secondary | ICD-10-CM | POA: Diagnosis not present

## 2019-12-14 DIAGNOSIS — N400 Enlarged prostate without lower urinary tract symptoms: Secondary | ICD-10-CM | POA: Diagnosis not present

## 2019-12-14 DIAGNOSIS — N049 Nephrotic syndrome with unspecified morphologic changes: Secondary | ICD-10-CM | POA: Diagnosis not present

## 2019-12-14 DIAGNOSIS — D631 Anemia in chronic kidney disease: Secondary | ICD-10-CM | POA: Diagnosis not present

## 2019-12-14 DIAGNOSIS — E1122 Type 2 diabetes mellitus with diabetic chronic kidney disease: Secondary | ICD-10-CM | POA: Diagnosis not present

## 2019-12-14 NOTE — Patient Instructions (Signed)
Pt is a 69 yr old male with Cefepime related encephalopathy here for hospital f/u.   1. Add Cefepime to Allergy list  2.Is HOH- nears people to speak up  3. Cognitively back to baseline; strength ALMOST back to baseline- still getting H/H PT.  4. BP is a little elevated- 159/75- if continues to be elevated, need to see PCP.   5. F/U prn

## 2019-12-14 NOTE — Assessment & Plan Note (Signed)
Hypertension. Current medications: 50mg  metoprolol, 75mg  hydralazine tid, 60mg  imdur, 80mg  lasix bid Blood pressure 137/65 in the office today.  Plan: continue current management.

## 2019-12-14 NOTE — Progress Notes (Signed)
Subjective:    Patient ID: Dennis Macias, male    DOB: Dec 31, 1950, 70 y.o.   MRN: [id number]  HPI  Pt is a 69 yr old male with Cefepime related encephalopathy here for hospital f/u.    Thinks brain working better- thinks his brain is back to normal- back to baseline.    Having PT and nurse (1x last week) coming to home- [name]- ~ 2x/week Walking well- with cane around house- walked with cane PRIOR to hospitalization as well.  No pain  Bowels and bladder working OK.     Pain Inventory Average Pain 0 Pain Right Now 0 My pain is na  In the last 24 hours, has pain interfered with the following? General activity 0 Relation with others 0 Enjoyment of life 0 What TIME of day is your pain at its worst? na Sleep (in general) Fair  Pain is worse with: na Pain improves with: na Relief from Meds: na  Mobility use a cane ability to climb steps?  no do you drive?  no use a wheelchair needs help with transfers  Function retired  Neuro/Psych trouble walking  Prior Studies TC appt  Physicians involved in your care TC APPT   Family History  Problem Relation Age of Onset  . Hypertension Mother   . Hyperlipidemia Mother   . Hyperlipidemia Father   . Colon cancer Neg Hx   . Colon polyps Neg Hx   . Esophageal cancer Neg Hx   . Rectal cancer Neg Hx   . Stomach cancer Neg Hx    Social History   Socioeconomic History  . Marital status: Married    Spouse name: Not on file  . Number of children: Not on file  . Years of education: Not on file  . Highest education level: Not on file  Occupational History  . Not on file  Tobacco Use  . Smoking status: Former Research scientist (life sciences)  . Smokeless tobacco: Former Systems developer    Types: Chew    Quit date: 07/09/1978  . Tobacco comment: quit 1 year ago  Substance and Sexual Activity  . Alcohol use: No    Alcohol/week: 1.0 standard drinks    Types: 1 Cans of beer per week    Comment: quit last march/2017  . Drug use: No  . Sexual activity:  Not on file  Other Topics Concern  . Not on file  Social History Narrative  . Not on file   Social Determinants of Health   Financial Resource Strain:   . Difficulty of Paying Living Expenses:   Food Insecurity:   . Worried About Charity fundraiser in the Last Year:   . Arboriculturist in the Last Year:   Transportation Needs:   . Film/video editor (Medical):   Marland Kitchen Lack of Transportation (Non-Medical):   Physical Activity:   . Days of Exercise per Week:   . Minutes of Exercise per Session:   Stress:   . Feeling of Stress :   Social Connections:   . Frequency of Communication with Friends and Family:   . Frequency of Social Gatherings with Friends and Family:   . Attends Religious Services:   . Active Member of Clubs or Organizations:   . Attends Archivist Meetings:   Marland Kitchen Marital Status:    Past Surgical History:  Procedure Laterality Date  . CATARACT EXTRACTION, BILATERAL    . COLONOSCOPY    . IR THORACENTESIS ASP PLEURAL SPACE W/IMG GUIDE  09/21/2019  .  IR THORACENTESIS ASP PLEURAL SPACE W/IMG GUIDE  10/16/2019  . LEFT HEART CATH AND CORONARY ANGIOGRAPHY N/A 11/19/2019   Procedure: LEFT HEART CATH AND CORONARY ANGIOGRAPHY;  Surgeon: Jettie Booze, MD;  Location: [place] [facility];  Service: Cardiovascular;  Laterality: N/A;  . POLYPECTOMY    . REFRACTIVE SURGERY  10/2017  . TEE WITHOUT CARDIOVERSION N/A 09/14/2015   Procedure: TRANSESOPHAGEAL ECHOCARDIOGRAM (TEE);  Surgeon: Larey Dresser, MD;  Location: [place];  Service: Cardiovascular;  Laterality: N/A;  . TRANSURETHRAL RESECTION OF PROSTATE N/A 10/20/2019   Procedure: TRANSURETHRAL RESECTION OF THE PROSTATE (TURP);  Surgeon: Irine Seal, MD;  Location: [facility];  Service: Urology;  Laterality: N/A;  . UPPER GASTROINTESTINAL ENDOSCOPY     Past Medical History:  Diagnosis Date  . Anemia   . Arthritis    past hx   . Blindness    right eye  . Cardiorenal syndrome   . Cataract    removed both  eyes  . Dehydration   . Diabetes ([place])   . Glaucoma   . History of CVA (cerebrovascular accident) 09/13/2015  . History of stroke 09/13/2015  . History of urinary retention   . Hyperlipidemia   . Hypertension   . NSTEMI (non-ST elevated myocardial infarction) ([place])   . Pernicious anemia 02/24/2018  . S/P TURP   . Stroke [facility])    2017- March  . Syncope 11/2019  . Tachycardia 08/26/2017  . Tubular adenoma of colon 02/2017  . Weight loss, non-intentional 08/26/2017   10 lbs between 6/18 & 2/19   BP (!) 159/75   Pulse 64   Temp 98.7 F (37.1 C)   Ht 6' (1.829 m)   Wt 170 lb (77.1 kg)   SpO2 98%   BMI 23.06 kg/m   Opioid Risk Score:   Fall Risk Score:  `1  Depression screen PHQ 2/9  Depression screen [facility] 2/9 11/05/2019 09/16/2019 08/04/2019 07/21/2019 06/15/2019 02/11/2019 01/08/2019  Decreased Interest 0 0 0 0 0 0 0  Down, Depressed, Hopeless 0 0 0 0 0 0 0  PHQ - 2 Score 0 0 0 0 0 0 0  Altered sleeping 0 - - - - - 2  Tired, decreased energy 0 - - - - - 1  Change in appetite 0 - - - - - 3  Feeling bad or failure about yourself  0 - - - - - 0  Trouble concentrating 0 - - - - - 1  Moving slowly or fidgety/restless 0 - - - - - 0  Suicidal thoughts 0 - - - - - 0  PHQ-9 Score 0 - - - - - 7  Difficult doing work/chores Not difficult at all - - - - Not difficult at all Somewhat difficult    Review of Systems  Musculoskeletal: Positive for gait problem.  All other systems reviewed and are negative.      Objective:   Physical Exam  Awake, alert, appropriate, accompanied by significant other, NAD In transport w/c- has 4 pronged cane MS: UEs  5-/5 in Deltoid, biceps, tricpes, WE, grip and finger abd LEs- HF 4+/5, KE 5-/5, PF 4+/5, DF and PF 5-/5       Assessment & Plan:  Pt is a 69 yr old male with Cefepime related encephalopathy here for hospital f/u.   1. Add Cefepime to Allergy list  2.Is HOH- needs people to speak up  3. Cognitively back to baseline; strength ALMOST  back to baseline- still getting  H/H PT.  4. BP is a little elevated- 159/75- if continues to be elevated, need to see PCP.   5. F/U prn

## 2019-12-14 NOTE — Assessment & Plan Note (Signed)
NSTEMI. LHC showing 75% stenosis to mid LAD and 80% stenosis of ost LAD to prox LAD. Stenting deferred due to being asymptomatic and poor renal function. Suspect this was a type II NSTEMI in the setting of infection and volume overload. Remains asymptomatic Plan: continue plavix and aspirin.

## 2019-12-14 NOTE — Assessment & Plan Note (Addendum)
On plavix and aspirin. F/u with neurology on 12/21/19.

## 2019-12-14 NOTE — Progress Notes (Signed)
Internal Medicine Clinic Attending  Case discussed with Dr. [name] at the time of the visit.  We reviewed the resident's history and exam and pertinent patient test results.  I agree with the assessment, diagnosis, and plan of care documented in the resident's note.    

## 2019-12-14 NOTE — Assessment & Plan Note (Signed)
Hgb stable at 9.6 today.

## 2019-12-14 NOTE — Assessment & Plan Note (Addendum)
CKD IV. Follows with Dr. Hollie Salk at [facility]. Labs indicating stable renal function at today's visit--GFR 15 Plan: Continue following with Dr. Hollie Salk.

## 2019-12-15 ENCOUNTER — Other Ambulatory Visit: Payer: Self-pay | Admitting: *Deleted

## 2019-12-15 DIAGNOSIS — Z466 Encounter for fitting and adjustment of urinary device: Secondary | ICD-10-CM | POA: Diagnosis not present

## 2019-12-15 DIAGNOSIS — I130 Hypertensive heart and chronic kidney disease with heart failure and stage 1 through stage 4 chronic kidney disease, or unspecified chronic kidney disease: Secondary | ICD-10-CM | POA: Diagnosis not present

## 2019-12-15 DIAGNOSIS — N184 Chronic kidney disease, stage 4 (severe): Secondary | ICD-10-CM | POA: Diagnosis not present

## 2019-12-15 DIAGNOSIS — E1122 Type 2 diabetes mellitus with diabetic chronic kidney disease: Secondary | ICD-10-CM | POA: Diagnosis not present

## 2019-12-15 DIAGNOSIS — D510 Vitamin B12 deficiency anemia due to intrinsic factor deficiency: Secondary | ICD-10-CM | POA: Diagnosis not present

## 2019-12-15 DIAGNOSIS — N049 Nephrotic syndrome with unspecified morphologic changes: Secondary | ICD-10-CM | POA: Diagnosis not present

## 2019-12-15 DIAGNOSIS — I5041 Acute combined systolic (congestive) and diastolic (congestive) heart failure: Secondary | ICD-10-CM | POA: Diagnosis not present

## 2019-12-15 DIAGNOSIS — I5042 Chronic combined systolic (congestive) and diastolic (congestive) heart failure: Secondary | ICD-10-CM

## 2019-12-15 DIAGNOSIS — I69354 Hemiplegia and hemiparesis following cerebral infarction affecting left non-dominant side: Secondary | ICD-10-CM | POA: Diagnosis not present

## 2019-12-15 DIAGNOSIS — I255 Ischemic cardiomyopathy: Secondary | ICD-10-CM | POA: Diagnosis not present

## 2019-12-15 MED ORDER — FUROSEMIDE 80 MG PO TABS: 80.0000 mg | ORAL_TABLET | Freq: Two times a day (BID) | ORAL | 0 refills | Status: DC

## 2019-12-17 DIAGNOSIS — I5041 Acute combined systolic (congestive) and diastolic (congestive) heart failure: Secondary | ICD-10-CM | POA: Diagnosis not present

## 2019-12-17 DIAGNOSIS — Z466 Encounter for fitting and adjustment of urinary device: Secondary | ICD-10-CM | POA: Diagnosis not present

## 2019-12-17 DIAGNOSIS — I255 Ischemic cardiomyopathy: Secondary | ICD-10-CM | POA: Diagnosis not present

## 2019-12-17 DIAGNOSIS — D510 Vitamin B12 deficiency anemia due to intrinsic factor deficiency: Secondary | ICD-10-CM | POA: Diagnosis not present

## 2019-12-17 DIAGNOSIS — I130 Hypertensive heart and chronic kidney disease with heart failure and stage 1 through stage 4 chronic kidney disease, or unspecified chronic kidney disease: Secondary | ICD-10-CM | POA: Diagnosis not present

## 2019-12-17 DIAGNOSIS — N184 Chronic kidney disease, stage 4 (severe): Secondary | ICD-10-CM | POA: Diagnosis not present

## 2019-12-17 DIAGNOSIS — I69354 Hemiplegia and hemiparesis following cerebral infarction affecting left non-dominant side: Secondary | ICD-10-CM | POA: Diagnosis not present

## 2019-12-17 DIAGNOSIS — N049 Nephrotic syndrome with unspecified morphologic changes: Secondary | ICD-10-CM | POA: Diagnosis not present

## 2019-12-17 DIAGNOSIS — E1122 Type 2 diabetes mellitus with diabetic chronic kidney disease: Secondary | ICD-10-CM | POA: Diagnosis not present

## 2019-12-18 ENCOUNTER — Telehealth: Payer: Self-pay | Admitting: *Deleted

## 2019-12-18 DIAGNOSIS — I69354 Hemiplegia and hemiparesis following cerebral infarction affecting left non-dominant side: Secondary | ICD-10-CM | POA: Diagnosis not present

## 2019-12-18 DIAGNOSIS — I255 Ischemic cardiomyopathy: Secondary | ICD-10-CM | POA: Diagnosis not present

## 2019-12-18 DIAGNOSIS — N049 Nephrotic syndrome with unspecified morphologic changes: Secondary | ICD-10-CM | POA: Diagnosis not present

## 2019-12-18 DIAGNOSIS — N184 Chronic kidney disease, stage 4 (severe): Secondary | ICD-10-CM | POA: Diagnosis not present

## 2019-12-18 DIAGNOSIS — I5041 Acute combined systolic (congestive) and diastolic (congestive) heart failure: Secondary | ICD-10-CM | POA: Diagnosis not present

## 2019-12-18 DIAGNOSIS — I130 Hypertensive heart and chronic kidney disease with heart failure and stage 1 through stage 4 chronic kidney disease, or unspecified chronic kidney disease: Secondary | ICD-10-CM | POA: Diagnosis not present

## 2019-12-18 DIAGNOSIS — E1122 Type 2 diabetes mellitus with diabetic chronic kidney disease: Secondary | ICD-10-CM | POA: Diagnosis not present

## 2019-12-18 DIAGNOSIS — D510 Vitamin B12 deficiency anemia due to intrinsic factor deficiency: Secondary | ICD-10-CM | POA: Diagnosis not present

## 2019-12-18 DIAGNOSIS — Z466 Encounter for fitting and adjustment of urinary device: Secondary | ICD-10-CM | POA: Diagnosis not present

## 2019-12-18 NOTE — Telephone Encounter (Signed)
VO for recert for [facility] PT by Clair Gulling PT 1x week for 4 weeks for continued safety and strengthening Do you agree?

## 2019-12-21 ENCOUNTER — Encounter: Payer: Self-pay | Admitting: Neurology

## 2019-12-21 ENCOUNTER — Ambulatory Visit: Payer: Medicare PPO | Admitting: Neurology

## 2019-12-21 ENCOUNTER — Other Ambulatory Visit: Payer: Self-pay

## 2019-12-21 DIAGNOSIS — G9340 Encephalopathy, unspecified: Secondary | ICD-10-CM | POA: Diagnosis not present

## 2019-12-21 DIAGNOSIS — I63412 Cerebral infarction due to embolism of left middle cerebral artery: Secondary | ICD-10-CM | POA: Diagnosis not present

## 2019-12-21 DIAGNOSIS — R55 Syncope and collapse: Secondary | ICD-10-CM | POA: Diagnosis not present

## 2019-12-21 NOTE — Progress Notes (Signed)
[facility] [address] [place]. [place] [address] [phone]       OFFICE  FOLLOW UP VISIT NOTE  [name] Date of Birth:  [birth year] Medical Record Number:  [id number]   Referring MD: Duffy Rhody Reason for Referral: Bilateral leg weakness HPI: Initial Consult 4/7/2021[name] is  69 year old African-American gentleman who is seen today for office consultation visit for leg weakness.  He is accompanied by his son-in-law as well as his daughter is present for this visit over the phone.  History is obtained from them, review of referral notes and I personally reviewed imaging films in PACS. Patient has been having increasing gait difficulties for the last 6 months.  He has remote history of bilateral lacunar infarcts in 2017 at that time he had seen me.  Had mild residual left-sided weakness and gait difficulties but was able to walk independently.  For the last 6 months he has had progressive difficulty walking and feels that his legs are weak and give out.  He occasionally has numbness in his feet but this is not bothersome.  He feels his right leg seems to be weaker now than the left one.  He denies significant back pain, radicular pain.  He has had problems with his prostate in the last several months and had urinary retention and chronic indwelling catheter.  He in fact is planning on having prostate surgery in the next few weeks.  Last month he was also diagnosed with bilateral pleural effusion and ascites which is required thoracocentesis but fluid analysis showed it to be a transudate and likely related to his hypoalbuminemia and anemia and renal failure.  Review of lab work show that on 10/13/2019 hemoglobin A1c was 5.2.  Hematocrit was 29.2 and albumin was low at 2.2.  Patient did have CT scan of the head on 08/05/2019 when he had a fall and sustained a left frontal contusion.  There is no acute brain abnormalities noted.  CT scan of cervical spine at that  time showed spondylitic changes at C3/4.  On inquiry the patient and daughter state that he did have an episode in through the end of January this year when he had some speech difficulties with garbled speech and some confusion he was seen in the emergency room with symptoms resolved and apparently brain scan was unremarkable and he was discharged home.  Patient was on aspirin until he started having some bleeding from his bladder and it was discontinued.  He has been tolerating Lipitor well without muscle aches and pains but is unclear as to when his last lipid profile was checked.  He denies any decreased appetite, weight loss or low-grade fevers. Prior stroke 09/12/2015-MRI scan showed 2 small acute lacunar infarcts in the right mid to posterior frontal lobe and a small acute infarct in the left anterior medial left frontal lobe. MRA of the brain showed no significant large vessel stenosis left terminal vertebral artery was not and irregular. LDL cholesterol is elevated at 148 and hemoglobin A1c at 10.7. Transesophageal echocardiogram showed normal ejection fraction of 60-65% and no cardiac source of embolism or PFO. Carotid Doppler showed no significant extracranial stenosis. Patient was started on aspirin for stroke prevention and Lipitor. Update 12/21/2019 : Patient is seen for follow-up after last consultation visit 2 months ago.  He was hospitalized on 11/06/2019 when he was eating breakfast and his girlfriend called paramedics he had a episode of feeling dizzy lightheaded and nauseous and nearly passing out.  He had extensive work-up in the hospital for this including arrhythmia, coronary artery disease, seizure, syncope and recent medication increase of hydralazine.  The patient underwent cardiac cath on 11/19/2019 which showed mild LAD lesion and ostial LAD lesion but he did not have any chest pain hence intervention was not performed.  Patient became confused on 11/11/2019 and CT scan of the head showed no  acute abnormalities.  Subsequently a code stroke was called and MRI scan was obtained which showed a tiny cortical infarct in left parietal region which was felt to be incidental and could not explain his altered mental status which was thought to be likely due to encephalopathy possibly from cefepime antibiotic he got for Pseudomonas UTI.  EEG showed moderate diffuse slowing consistent with encephalopathy.  His mental status gradually improved.  He was in mild renal failure which also improved.  He was subsequently transferred to inpatient rehab and has made progressive improvement.  Currently is living at home.  He is able to walk and is getting home physical and occupational therapy.  His mentation has improved significantly and is back to his baseline.  He does use a cane mostly when walking but uses walker for long distances.  Outpatient 30-day heart monitor was recommended but so far has not been done.  2D echo showed EF of 40 to 45% LA was severely dilated.  LDL cholesterol 56 mg percent.  Hemoglobin A1c was 5.9.  Patient was recommended to be on Plavix alone due to history of recent hematuria but review of medication shows that he is on both.  He is complaining of some bruising but no more hematuria or bleeding. ROS:   14 system review of systems is positive for weakness, gait difficulty, imbalance, confusion, altered mental status n, prostate problems all other systems negative  PMH:  Past Medical History:  Diagnosis Date  . Anemia   . Arthritis    past hx   . Blindness    right eye  . Cardiorenal syndrome   . Cataract    removed both eyes  . Dehydration   . Diabetes ([place])   . Glaucoma   . History of CVA (cerebrovascular accident) 09/13/2015  . History of stroke 09/13/2015  . History of urinary retention   . Hyperlipidemia   . Hypertension   . NSTEMI (non-ST elevated myocardial infarction) ([place])   . Pernicious anemia 02/24/2018  . S/P TURP   . Stroke [facility])    2017- March  . Syncope  11/2019  . Tachycardia 08/26/2017  . Tubular adenoma of colon 02/2017  . Weight loss, non-intentional 08/26/2017   10 lbs between 6/18 & 2/19    Social History:  Social History   Socioeconomic History  . Marital status: Married    Spouse name: Not on file  . Number of children: Not on file  . Years of education: Not on file  . Highest education level: Not on file  Occupational History  . Not on file  Tobacco Use  . Smoking status: Former Research scientist (life sciences)  . Smokeless tobacco: Former Systems developer    Types: Chew    Quit date: 07/09/1978  . Tobacco comment: quit 1 year ago  Vaping Use  . Vaping Use: Never used  Substance and Sexual Activity  . Alcohol use: No    Alcohol/week: 1.0 standard drink    Types: 1 Cans of beer per week    Comment: quit last march/2017  . Drug use: No  . Sexual activity: Not on file  Other Topics Concern  . Not on file  Social History Narrative  . Not on file   Social Determinants of Health   Financial Resource Strain:   . Difficulty of Paying Living Expenses:   Food Insecurity:   . Worried About Charity fundraiser in the Last Year:   . Arboriculturist in the Last Year:   Transportation Needs:   . Film/video editor (Medical):   Marland Kitchen Lack of Transportation (Non-Medical):   Physical Activity:   . Days of Exercise per Week:   . Minutes of Exercise per Session:   Stress:   . Feeling of Stress :   Social Connections:   . Frequency of Communication with Friends and Family:   . Frequency of Social Gatherings with Friends and Family:   . Attends Religious Services:   . Active Member of Clubs or Organizations:   . Attends Archivist Meetings:   Marland Kitchen Marital Status:   Intimate Partner Violence:   . Fear of Current or Ex-Partner:   . Emotionally Abused:   Marland Kitchen Physically Abused:   . Sexually Abused:     Medications:   Current Outpatient Medications on File Prior to Visit  Medication Sig Dispense Refill  . Accu-Chek FastClix Lancets MISC Check blood  sugar up to 7 times a week as instructed 102 each 3  . acetaminophen (TYLENOL) 325 MG tablet Take 1-2 tablets (325-650 mg total) by mouth every 4 (four) hours as needed for mild pain.    Marland Kitchen amLODipine (NORVASC) 5 MG tablet Take 1 tablet (5 mg total) by mouth daily. 30 tablet 0  . aspirin EC 81 MG tablet Take 1 tablet (81 mg total) by mouth daily.    Marland Kitchen atorvastatin (LIPITOR) 80 MG tablet Take 1 tablet (80 mg total) by mouth at bedtime. IM program 90 tablet 3  . Blood Glucose Monitoring Suppl (ACCU-CHEK GUIDE) w/Device KIT 1 each by Does not apply route daily. Check blood sugar as instructed up to 7 times a week 1 kit 0  . carvedilol (COREG) 25 MG tablet Take 1 tablet (25 mg total) by mouth 2 (two) times daily with a meal. 60 tablet 0  . clopidogrel (PLAVIX) 75 MG tablet Take 1 tablet (75 mg total) by mouth daily. 30 tablet 1  . dorzolamide-timolol (COSOPT) 22.3-6.8 MG/ML ophthalmic solution Place 1 drop into the right eye 2 (two) times daily. 10 mL 10  . ferrous gluconate (FERGON) 324 MG tablet Take 1 tablet (324 mg total) by mouth daily with breakfast. 30 tablet 3  . finasteride (PROSCAR) 5 MG tablet Take 1 tablet (5 mg total) by mouth daily. 30 tablet 0  . furosemide (LASIX) 80 MG tablet Take 1 tablet (80 mg total) by mouth 2 (two) times daily. 60 tablet 0  . glucose blood (ACCU-CHEK GUIDE) test strip Check blood sugar up to 7 times a week as instructed 100 each 3  . isosorbide mononitrate (IMDUR) 60 MG 24 hr tablet Take 1 tablet (60 mg total) by mouth daily. 30 tablet 0  . melatonin 3 MG TABS tablet Take 1 tablet (3 mg total) by mouth at bedtime. 30 tablet 0  . tamsulosin (FLOMAX) 0.4 MG CAPS capsule Take 1 capsule (0.4 mg total) by mouth daily after supper. 30 capsule 0  . vitamin B-12 (CYANOCOBALAMIN) 1000 MCG tablet Take 1 tablet (1,000 mcg total) by mouth daily. 30 tablet 0   No current facility-administered medications on file prior to visit.  Allergies:   Allergies  Allergen Reactions   . Cefepime Other (See Comments)    Pt had BAD encephalopathy from Cefepime    Physical Exam General: Frail elderly African-American male, seated, in no evident distress Head: head normocephalic and atraumatic.   Neck: supple with no carotid or supraclavicular bruits Cardiovascular: regular rate and rhythm, no murmurs Musculoskeletal: no deformity Skin:  no rash/petichiae Vascular:  Normal pulses all extremities  Neurologic Exam Mental Status: Awake and fully alert. Oriented to place and time. Recent and remote memory intact. Attention span, concentration and fund of knowledge appropriate. Mood and affect appropriate.  Speech slightly hypophonic but clear without dysarthria.  Jaw jerk is brisk Cranial Nerves: Fundoscopic exam not done. Pupils equal, briskly reactive to light. Extraocular movements full without nystagmus. Visual fields full to confrontation. Hearing intact. Facial sensation intact.  Mild left lower facial asymmetry when he smiles.  Tongue, palate moves normally and symmetrically.  Motor: Tone is increased in the left upper and lower extremity.  Weakness of bilateral grip left greater than right and left elbow flexors, extensors and shoulder abductor's.  Mild weakness of bilateral hip flexors left better than right and ankle dorsiflexors again left greater than right. Sensory.: intact to touch , pinprick , position but diminished vibratory sensation bilaterally from ankle down.  Coordination finger-to-nose and needle coordination impaired slightly on the left compared to the right. Gait and Station: Arises from chair with  difficulty and two-person assist. Stance is stooped l. Gait demonstrates stiffness of the legs left greater than right.  Unable to walk tandem  reflexes: 2+ and asymmetric and brisker on the left compared to the right. Toes downgoing.      ASSESSMENT: 69 year old African-American male with subacute gait difficulties and leg weakness of unclear etiology  possibly interval new strokes and deconditioning due to his ongoing renal failure and medical issues.  Remote history of lacunar infarcts in 2017.  Recent admission in May 2021 for altered mental status in the setting of Pseudomonas UTI with cefepime  encephalopathy and MRI showed a tiny incidental parietal embolic infarct unlikely to explain clinical presentation.     PLAN: I had a long discussion the patient and his wife regarding his recent hospitalization for syncope, UTI, encephalopathy and a tiny embolic left MCA branch infarct which was likely not responsible for her his presentation and answered questions.  I recommend he stay on Plavix for stroke prevention and consider stopping aspirin after discussion with his cardiologist since is having a lot of bruising.  I recommend we do a 30-day heart monitor to look for paroxysmal A. fib.  Maintain aggressive risk factor modification with strict control of hypertension with blood pressure goal below 130/90, diabetes with hemoglobin A1c goal below 6.5% and lipids with LDL cholesterol goal below 70%.  He was encouraged to use a cane while walking and we discussed fall safety precautions.  He will return for follow-up in the future in 6 months with my nurse practitioner Janett Billow or call earlier if necessary..  Greater than 50% time during this 30-minute visit was spent in counseling and coordination of care about his leg weakness gait difficulties and stroke and answering questions  . Antony Contras, MD  [facility] [address] [place], [place] [address]  Phone [phone] Fax [phone] Note: This document was prepared with digital dictation and possible smart phrase technology. Any transcriptional errors that result from this process are unintentional.

## 2019-12-21 NOTE — Patient Instructions (Signed)
I had a long discussion the patient and his wife regarding his recent hospitalization for syncope, UTI, encephalopathy and a tiny embolic left MCA branch infarct which was likely not responsible for her his presentation and answered questions.  I recommend he stay on Plavix for stroke prevention and consider stopping aspirin after discussion with his cardiologist since is having a lot of bruising.  I recommend we do a 30-day heart monitor to look for paroxysmal A. fib.  Maintain aggressive risk factor modification with strict control of hypertension with blood pressure goal below 130/90, diabetes with hemoglobin A1c goal below 6.5% and lipids with LDL cholesterol goal below 70%.  He was encouraged to use a cane while walking and we discussed fall safety precautions.  He will return for follow-up in the future in 6 months with my nurse practitioner Janett Billow or call earlier if necessary.

## 2019-12-22 ENCOUNTER — Other Ambulatory Visit (HOSPITAL_COMMUNITY): Payer: Self-pay

## 2019-12-22 DIAGNOSIS — G92 Toxic encephalopathy: Secondary | ICD-10-CM | POA: Diagnosis not present

## 2019-12-22 DIAGNOSIS — I255 Ischemic cardiomyopathy: Secondary | ICD-10-CM | POA: Diagnosis not present

## 2019-12-22 DIAGNOSIS — I252 Old myocardial infarction: Secondary | ICD-10-CM | POA: Diagnosis not present

## 2019-12-22 DIAGNOSIS — T361X5D Adverse effect of cephalosporins and other beta-lactam antibiotics, subsequent encounter: Secondary | ICD-10-CM | POA: Diagnosis not present

## 2019-12-22 DIAGNOSIS — E1122 Type 2 diabetes mellitus with diabetic chronic kidney disease: Secondary | ICD-10-CM | POA: Diagnosis not present

## 2019-12-22 DIAGNOSIS — I5043 Acute on chronic combined systolic (congestive) and diastolic (congestive) heart failure: Secondary | ICD-10-CM | POA: Diagnosis not present

## 2019-12-22 DIAGNOSIS — I2510 Atherosclerotic heart disease of native coronary artery without angina pectoris: Secondary | ICD-10-CM | POA: Diagnosis not present

## 2019-12-22 DIAGNOSIS — I0981 Rheumatic heart failure: Secondary | ICD-10-CM | POA: Diagnosis not present

## 2019-12-22 DIAGNOSIS — I130 Hypertensive heart and chronic kidney disease with heart failure and stage 1 through stage 4 chronic kidney disease, or unspecified chronic kidney disease: Secondary | ICD-10-CM | POA: Diagnosis not present

## 2019-12-22 NOTE — Discharge Instructions (Signed)
  Epoetin Alfa injection What is this medicine? EPOETIN ALFA (e POE e tin AL fa) helps your body make more red blood cells. This medicine is used to treat anemia caused by chronic kidney disease, cancer chemotherapy, or HIV-therapy. It may also be used before surgery if you have anemia. This medicine may be used for other purposes; ask your health care provider or pharmacist if you have questions. COMMON BRAND NAME(S): Epogen, Procrit, Retacrit What should I tell my health care provider before I take this medicine? They need to know if you have any of these conditions:  cancer  heart disease  high blood pressure  history of blood clots  history of stroke  low levels of folate, iron, or vitamin B12 in the blood  seizures  an unusual or allergic reaction to erythropoietin, albumin, benzyl alcohol, hamster proteins, other medicines, foods, dyes, or preservatives  pregnant or trying to get pregnant  breast-feeding How should I use this medicine? This medicine is for injection into a vein or under the skin. It is usually given by a health care professional in a hospital or clinic setting. If you get this medicine at home, you will be taught how to prepare and give this medicine. Use exactly as directed. Take your medicine at regular intervals. Do not take your medicine more often than directed. It is important that you put your used needles and syringes in a special sharps container. Do not put them in a trash can. If you do not have a sharps container, call your pharmacist or healthcare provider to get one. A special MedGuide will be given to you by the pharmacist with each prescription and refill. Be sure to read this information carefully each time. Talk to your pediatrician regarding the use of this medicine in children. While this drug may be prescribed for selected conditions, precautions do apply. Overdosage: If you think you have taken too much of this medicine contact a  poison control center or emergency room at once. NOTE: This medicine is only for you. Do not share this medicine with others. What if I miss a dose? If you miss a dose, take it as soon as you can. If it is almost time for your next dose, take only that dose. Do not take double or extra doses. What may interact with this medicine? Interactions have not been studied. This list may not describe all possible interactions. Give your health care provider a list of all the medicines, herbs, non-prescription drugs, or dietary supplements you use. Also tell them if you smoke, drink alcohol, or use illegal drugs. Some items may interact with your medicine. What should I watch for while using this medicine? Your condition will be monitored carefully while you are receiving this medicine. You may need blood work done while you are taking this medicine. This medicine may cause a decrease in vitamin B6. You should make sure that you get enough vitamin B6 while you are taking this medicine. Discuss the foods you eat and the vitamins you take with your health care professional. What side effects may I notice from receiving this medicine? Side effects that you should report to your doctor or health care professional as soon as possible:  allergic reactions like skin rash, itching or hives, swelling of the face, lips, or tongue  seizures  signs and symptoms of a blood clot such as breathing problems; changes in vision; chest pain; severe, sudden headache; pain, swelling, warmth in the leg; trouble speaking; sudden   numbness or weakness of the face, arm or leg  signs and symptoms of a stroke like changes in vision; confusion; trouble speaking or understanding; severe headaches; sudden numbness or weakness of the face, arm or leg; trouble walking; dizziness; loss of balance or coordination Side effects that usually do not require medical attention (report to your doctor or health care professional if they continue  or are bothersome):  chills  cough  dizziness  fever  headaches  joint pain  muscle cramps  muscle pain  nausea, vomiting  pain, redness, or irritation at site where injected This list may not describe all possible side effects. Call your doctor for medical advice about side effects. You may report side effects to FDA at 1-800-FDA-1088. Where should I keep my medicine? Keep out of the reach of children. Store in a refrigerator between 2 and 8 degrees C (36 and 46 degrees F). Do not freeze or shake. Throw away any unused portion if using a single-dose vial. Multi-dose vials can be kept in the refrigerator for up to 21 days after the initial dose. Throw away unused medicine. NOTE: This sheet is a summary. It may not cover all possible information. If you have questions about this medicine, talk to your doctor, pharmacist, or health care provider.  2020 Elsevier/Gold Standard (2017-02-01 08:35:19) Ferumoxytol injection What is this medicine? FERUMOXYTOL is an iron complex. Iron is used to make healthy red blood cells, which carry oxygen and nutrients throughout the body. This medicine is used to treat iron deficiency anemia. This medicine may be used for other purposes; ask your health care provider or pharmacist if you have questions. COMMON BRAND NAME(S): Feraheme What should I tell my health care provider before I take this medicine? They need to know if you have any of these conditions:  anemia not caused by low iron levels  high levels of iron in the blood  magnetic resonance imaging (MRI) test scheduled  an unusual or allergic reaction to iron, other medicines, foods, dyes, or preservatives  pregnant or trying to get pregnant  breast-feeding How should I use this medicine? This medicine is for injection into a vein. It is given by a health care professional in a hospital or clinic setting. Talk to your pediatrician regarding the use of this medicine in children.  Special care may be needed. Overdosage: If you think you have taken too much of this medicine contact a poison control center or emergency room at once. NOTE: This medicine is only for you. Do not share this medicine with others. What if I miss a dose? It is important not to miss your dose. Call your doctor or health care professional if you are unable to keep an appointment. What may interact with this medicine? This medicine may interact with the following medications:  other iron products This list may not describe all possible interactions. Give your health care provider a list of all the medicines, herbs, non-prescription drugs, or dietary supplements you use. Also tell them if you smoke, drink alcohol, or use illegal drugs. Some items may interact with your medicine. What should I watch for while using this medicine? Visit your doctor or healthcare professional regularly. Tell your doctor or healthcare professional if your symptoms do not start to get better or if they get worse. You may need blood work done while you are taking this medicine. You may need to follow a special diet. Talk to your doctor. Foods that contain iron include: whole grains/cereals, dried fruits,   beans, or peas, leafy green vegetables, and organ meats (liver, kidney). What side effects may I notice from receiving this medicine? Side effects that you should report to your doctor or health care professional as soon as possible:  allergic reactions like skin rash, itching or hives, swelling of the face, lips, or tongue  breathing problems  changes in blood pressure  feeling faint or lightheaded, falls  fever or chills  flushing, sweating, or hot feelings  swelling of the ankles or feet Side effects that usually do not require medical attention (report to your doctor or health care professional if they continue or are bothersome):  diarrhea  headache  nausea, vomiting  stomach pain This list may not  describe all possible side effects. Call your doctor for medical advice about side effects. You may report side effects to FDA at 1-800-FDA-1088. Where should I keep my medicine? This drug is given in a hospital or clinic and will not be stored at home. NOTE: This sheet is a summary. It may not cover all possible information. If you have questions about this medicine, talk to your doctor, pharmacist, or health care provider.  2020 Elsevier/Gold Standard (2016-08-13 20:21:10)  

## 2019-12-23 ENCOUNTER — Encounter (HOSPITAL_COMMUNITY)
Admission: RE | Admit: 2019-12-23 | Discharge: 2019-12-23 | Disposition: A | Discharge status: Home / Self Care | Payer: Medicare PPO | Source: Ambulatory Visit | Attending: Nephrology | Admitting: Nephrology

## 2019-12-23 ENCOUNTER — Other Ambulatory Visit: Payer: Self-pay

## 2019-12-23 VITALS — BP 160/76 | HR 60 | Temp 98.3°F | Resp 18

## 2019-12-23 DIAGNOSIS — I2510 Atherosclerotic heart disease of native coronary artery without angina pectoris: Secondary | ICD-10-CM | POA: Diagnosis present

## 2019-12-23 DIAGNOSIS — I10 Essential (primary) hypertension: Secondary | ICD-10-CM | POA: Diagnosis not present

## 2019-12-23 DIAGNOSIS — Z79899 Other long term (current) drug therapy: Secondary | ICD-10-CM | POA: Diagnosis not present

## 2019-12-23 DIAGNOSIS — Z83438 Family history of other disorder of lipoprotein metabolism and other lipidemia: Secondary | ICD-10-CM | POA: Diagnosis not present

## 2019-12-23 DIAGNOSIS — E11319 Type 2 diabetes mellitus with unspecified diabetic retinopathy without macular edema: Secondary | ICD-10-CM | POA: Diagnosis present

## 2019-12-23 DIAGNOSIS — J90 Pleural effusion, not elsewhere classified: Secondary | ICD-10-CM | POA: Diagnosis not present

## 2019-12-23 DIAGNOSIS — Z7902 Long term (current) use of antithrombotics/antiplatelets: Secondary | ICD-10-CM | POA: Diagnosis not present

## 2019-12-23 DIAGNOSIS — R0902 Hypoxemia: Secondary | ICD-10-CM | POA: Diagnosis not present

## 2019-12-23 DIAGNOSIS — I509 Heart failure, unspecified: Secondary | ICD-10-CM | POA: Diagnosis not present

## 2019-12-23 DIAGNOSIS — I132 Hypertensive heart and chronic kidney disease with heart failure and with stage 5 chronic kidney disease, or end stage renal disease: Secondary | ICD-10-CM | POA: Diagnosis not present

## 2019-12-23 DIAGNOSIS — N400 Enlarged prostate without lower urinary tract symptoms: Secondary | ICD-10-CM | POA: Diagnosis present

## 2019-12-23 DIAGNOSIS — Z20822 Contact with and (suspected) exposure to covid-19: Secondary | ICD-10-CM | POA: Diagnosis not present

## 2019-12-23 DIAGNOSIS — Z79890 Hormone replacement therapy (postmenopausal): Secondary | ICD-10-CM | POA: Diagnosis not present

## 2019-12-23 DIAGNOSIS — R0602 Shortness of breath: Secondary | ICD-10-CM | POA: Diagnosis present

## 2019-12-23 DIAGNOSIS — N390 Urinary tract infection, site not specified: Secondary | ICD-10-CM | POA: Diagnosis present

## 2019-12-23 DIAGNOSIS — I110 Hypertensive heart disease with heart failure: Secondary | ICD-10-CM | POA: Diagnosis not present

## 2019-12-23 DIAGNOSIS — H547 Unspecified visual loss: Secondary | ICD-10-CM | POA: Diagnosis present

## 2019-12-23 DIAGNOSIS — Z7982 Long term (current) use of aspirin: Secondary | ICD-10-CM | POA: Diagnosis not present

## 2019-12-23 DIAGNOSIS — I5043 Acute on chronic combined systolic (congestive) and diastolic (congestive) heart failure: Secondary | ICD-10-CM | POA: Diagnosis present

## 2019-12-23 DIAGNOSIS — D631 Anemia in chronic kidney disease: Secondary | ICD-10-CM | POA: Diagnosis not present

## 2019-12-23 DIAGNOSIS — R4702 Dysphasia: Secondary | ICD-10-CM | POA: Diagnosis not present

## 2019-12-23 DIAGNOSIS — I4510 Unspecified right bundle-branch block: Secondary | ICD-10-CM | POA: Diagnosis not present

## 2019-12-23 DIAGNOSIS — B965 Pseudomonas (aeruginosa) (mallei) (pseudomallei) as the cause of diseases classified elsewhere: Secondary | ICD-10-CM | POA: Diagnosis present

## 2019-12-23 DIAGNOSIS — N049 Nephrotic syndrome with unspecified morphologic changes: Secondary | ICD-10-CM | POA: Diagnosis not present

## 2019-12-23 DIAGNOSIS — E872 Acidosis: Secondary | ICD-10-CM | POA: Diagnosis not present

## 2019-12-23 DIAGNOSIS — N179 Acute kidney failure, unspecified: Secondary | ICD-10-CM | POA: Diagnosis present

## 2019-12-23 DIAGNOSIS — I214 Non-ST elevation (NSTEMI) myocardial infarction: Secondary | ICD-10-CM | POA: Diagnosis not present

## 2019-12-23 DIAGNOSIS — J9601 Acute respiratory failure with hypoxia: Secondary | ICD-10-CM | POA: Diagnosis not present

## 2019-12-23 DIAGNOSIS — Z8673 Personal history of transient ischemic attack (TIA), and cerebral infarction without residual deficits: Secondary | ICD-10-CM | POA: Diagnosis not present

## 2019-12-23 DIAGNOSIS — N184 Chronic kidney disease, stage 4 (severe): Secondary | ICD-10-CM

## 2019-12-23 DIAGNOSIS — N185 Chronic kidney disease, stage 5: Secondary | ICD-10-CM | POA: Diagnosis present

## 2019-12-23 DIAGNOSIS — I248 Other forms of acute ischemic heart disease: Secondary | ICD-10-CM | POA: Diagnosis not present

## 2019-12-23 DIAGNOSIS — Z8249 Family history of ischemic heart disease and other diseases of the circulatory system: Secondary | ICD-10-CM | POA: Diagnosis not present

## 2019-12-23 DIAGNOSIS — N289 Disorder of kidney and ureter, unspecified: Secondary | ICD-10-CM | POA: Diagnosis not present

## 2019-12-23 DIAGNOSIS — I161 Hypertensive emergency: Secondary | ICD-10-CM | POA: Diagnosis present

## 2019-12-23 DIAGNOSIS — R0689 Other abnormalities of breathing: Secondary | ICD-10-CM | POA: Diagnosis not present

## 2019-12-23 DIAGNOSIS — I5042 Chronic combined systolic (congestive) and diastolic (congestive) heart failure: Secondary | ICD-10-CM | POA: Diagnosis not present

## 2019-12-23 DIAGNOSIS — I5021 Acute systolic (congestive) heart failure: Secondary | ICD-10-CM | POA: Diagnosis not present

## 2019-12-23 DIAGNOSIS — D509 Iron deficiency anemia, unspecified: Secondary | ICD-10-CM | POA: Diagnosis present

## 2019-12-23 DIAGNOSIS — I160 Hypertensive urgency: Secondary | ICD-10-CM | POA: Diagnosis not present

## 2019-12-23 DIAGNOSIS — E1129 Type 2 diabetes mellitus with other diabetic kidney complication: Secondary | ICD-10-CM | POA: Diagnosis not present

## 2019-12-23 DIAGNOSIS — Z888 Allergy status to other drugs, medicaments and biological substances status: Secondary | ICD-10-CM | POA: Diagnosis not present

## 2019-12-23 LAB — POCT HEMOGLOBIN-HEMACUE: Hemoglobin: 9 g/dL — ABNORMAL LOW (ref 13.0–17.0)

## 2019-12-23 MED ORDER — EPOETIN ALFA-EPBX 10000 UNIT/ML IJ SOLN
20000.0000 [IU] | INTRAMUSCULAR | Status: DC
  Administered 2019-12-23: 20000 [IU] via SUBCUTANEOUS

## 2019-12-23 MED ORDER — EPOETIN ALFA-EPBX 10000 UNIT/ML IJ SOLN
INTRAMUSCULAR | Status: AC
  Filled 2019-12-23: qty 2

## 2019-12-23 MED ORDER — SODIUM CHLORIDE 0.9 % IV SOLN
510.0000 mg | INTRAVENOUS | Status: DC
  Administered 2019-12-23: 510 mg via INTRAVENOUS
  Filled 2019-12-23: qty 17

## 2019-12-23 NOTE — Progress Notes (Signed)
Thank you for checking in with him and [facility]. I suspected that to be the case but wanted to make sure he was getting all possible resources.

## 2019-12-24 ENCOUNTER — Emergency Department (HOSPITAL_COMMUNITY): Payer: Medicare PPO

## 2019-12-24 ENCOUNTER — Other Ambulatory Visit: Payer: Self-pay

## 2019-12-24 ENCOUNTER — Ambulatory Visit: Payer: Self-pay

## 2019-12-24 ENCOUNTER — Encounter: Payer: Medicare PPO | Admitting: Internal Medicine

## 2019-12-24 ENCOUNTER — Inpatient Hospital Stay (HOSPITAL_COMMUNITY)
Admit: 2019-12-24 | Discharge: 2019-12-26 | DRG: 291 | Disposition: A | Discharge status: Home Health | Payer: Medicare PPO | Attending: Internal Medicine | Admitting: Internal Medicine

## 2019-12-24 ENCOUNTER — Encounter (HOSPITAL_COMMUNITY): Payer: Self-pay

## 2019-12-24 ENCOUNTER — Inpatient Hospital Stay (HOSPITAL_COMMUNITY): Payer: Medicare PPO

## 2019-12-24 DIAGNOSIS — Z7902 Long term (current) use of antithrombotics/antiplatelets: Secondary | ICD-10-CM

## 2019-12-24 DIAGNOSIS — D631 Anemia in chronic kidney disease: Secondary | ICD-10-CM | POA: Diagnosis present

## 2019-12-24 DIAGNOSIS — Z8673 Personal history of transient ischemic attack (TIA), and cerebral infarction without residual deficits: Secondary | ICD-10-CM | POA: Diagnosis not present

## 2019-12-24 DIAGNOSIS — Z7982 Long term (current) use of aspirin: Secondary | ICD-10-CM

## 2019-12-24 DIAGNOSIS — I5043 Acute on chronic combined systolic (congestive) and diastolic (congestive) heart failure: Secondary | ICD-10-CM | POA: Diagnosis present

## 2019-12-24 DIAGNOSIS — I2510 Atherosclerotic heart disease of native coronary artery without angina pectoris: Secondary | ICD-10-CM | POA: Diagnosis present

## 2019-12-24 DIAGNOSIS — Z20822 Contact with and (suspected) exposure to covid-19: Secondary | ICD-10-CM | POA: Diagnosis present

## 2019-12-24 DIAGNOSIS — N185 Chronic kidney disease, stage 5: Secondary | ICD-10-CM | POA: Diagnosis present

## 2019-12-24 DIAGNOSIS — Z79890 Hormone replacement therapy (postmenopausal): Secondary | ICD-10-CM

## 2019-12-24 DIAGNOSIS — R0602 Shortness of breath: Secondary | ICD-10-CM | POA: Diagnosis present

## 2019-12-24 DIAGNOSIS — N390 Urinary tract infection, site not specified: Secondary | ICD-10-CM | POA: Diagnosis present

## 2019-12-24 DIAGNOSIS — I248 Other forms of acute ischemic heart disease: Secondary | ICD-10-CM | POA: Diagnosis present

## 2019-12-24 DIAGNOSIS — I10 Essential (primary) hypertension: Secondary | ICD-10-CM | POA: Diagnosis not present

## 2019-12-24 DIAGNOSIS — B965 Pseudomonas (aeruginosa) (mallei) (pseudomallei) as the cause of diseases classified elsewhere: Secondary | ICD-10-CM | POA: Diagnosis present

## 2019-12-24 DIAGNOSIS — I161 Hypertensive emergency: Secondary | ICD-10-CM | POA: Diagnosis present

## 2019-12-24 DIAGNOSIS — I5021 Acute systolic (congestive) heart failure: Secondary | ICD-10-CM | POA: Diagnosis not present

## 2019-12-24 DIAGNOSIS — Z9842 Cataract extraction status, left eye: Secondary | ICD-10-CM

## 2019-12-24 DIAGNOSIS — I132 Hypertensive heart and chronic kidney disease with heart failure and with stage 5 chronic kidney disease, or end stage renal disease: Secondary | ICD-10-CM | POA: Diagnosis present

## 2019-12-24 DIAGNOSIS — N179 Acute kidney failure, unspecified: Secondary | ICD-10-CM | POA: Diagnosis present

## 2019-12-24 DIAGNOSIS — I4510 Unspecified right bundle-branch block: Secondary | ICD-10-CM | POA: Diagnosis present

## 2019-12-24 DIAGNOSIS — Z79899 Other long term (current) drug therapy: Secondary | ICD-10-CM

## 2019-12-24 DIAGNOSIS — Z83438 Family history of other disorder of lipoprotein metabolism and other lipidemia: Secondary | ICD-10-CM

## 2019-12-24 DIAGNOSIS — I252 Old myocardial infarction: Secondary | ICD-10-CM

## 2019-12-24 DIAGNOSIS — D509 Iron deficiency anemia, unspecified: Secondary | ICD-10-CM | POA: Diagnosis present

## 2019-12-24 DIAGNOSIS — N400 Enlarged prostate without lower urinary tract symptoms: Secondary | ICD-10-CM | POA: Diagnosis present

## 2019-12-24 DIAGNOSIS — Z87891 Personal history of nicotine dependence: Secondary | ICD-10-CM

## 2019-12-24 DIAGNOSIS — H547 Unspecified visual loss: Secondary | ICD-10-CM | POA: Diagnosis present

## 2019-12-24 DIAGNOSIS — I509 Heart failure, unspecified: Secondary | ICD-10-CM

## 2019-12-24 DIAGNOSIS — N049 Nephrotic syndrome with unspecified morphologic changes: Secondary | ICD-10-CM | POA: Diagnosis present

## 2019-12-24 DIAGNOSIS — Z9841 Cataract extraction status, right eye: Secondary | ICD-10-CM

## 2019-12-24 DIAGNOSIS — E11319 Type 2 diabetes mellitus with unspecified diabetic retinopathy without macular edema: Secondary | ICD-10-CM | POA: Diagnosis present

## 2019-12-24 DIAGNOSIS — E875 Hyperkalemia: Secondary | ICD-10-CM | POA: Diagnosis present

## 2019-12-24 DIAGNOSIS — E1122 Type 2 diabetes mellitus with diabetic chronic kidney disease: Secondary | ICD-10-CM | POA: Diagnosis present

## 2019-12-24 DIAGNOSIS — Z888 Allergy status to other drugs, medicaments and biological substances status: Secondary | ICD-10-CM

## 2019-12-24 DIAGNOSIS — Z8249 Family history of ischemic heart disease and other diseases of the circulatory system: Secondary | ICD-10-CM

## 2019-12-24 DIAGNOSIS — E785 Hyperlipidemia, unspecified: Secondary | ICD-10-CM | POA: Diagnosis present

## 2019-12-24 DIAGNOSIS — N184 Chronic kidney disease, stage 4 (severe): Secondary | ICD-10-CM | POA: Diagnosis present

## 2019-12-24 DIAGNOSIS — I214 Non-ST elevation (NSTEMI) myocardial infarction: Secondary | ICD-10-CM | POA: Diagnosis present

## 2019-12-24 DIAGNOSIS — J9601 Acute respiratory failure with hypoxia: Secondary | ICD-10-CM

## 2019-12-24 DIAGNOSIS — I5042 Chronic combined systolic (congestive) and diastolic (congestive) heart failure: Secondary | ICD-10-CM

## 2019-12-24 LAB — ECHOCARDIOGRAM COMPLETE
Height: 72 in
Weight: 2656 oz

## 2019-12-24 LAB — CBC WITH DIFFERENTIAL/PLATELET
Abs Immature Granulocytes: 0.06 10*3/uL (ref 0.00–0.07)
Basophils Absolute: 0 10*3/uL (ref 0.0–0.1)
Basophils Relative: 0 %
Eosinophils Absolute: 0 10*3/uL (ref 0.0–0.5)
Eosinophils Relative: 0 %
HCT: 29.7 % — ABNORMAL LOW (ref 39.0–52.0)
Hemoglobin: 9.3 g/dL — ABNORMAL LOW (ref 13.0–17.0)
Immature Granulocytes: 0 %
Lymphocytes Relative: 8 %
Lymphs Abs: 1.1 10*3/uL (ref 0.7–4.0)
MCH: 28.7 pg (ref 26.0–34.0)
MCHC: 31.3 g/dL (ref 30.0–36.0)
MCV: 91.7 fL (ref 80.0–100.0)
Monocytes Absolute: 0.4 10*3/uL (ref 0.1–1.0)
Monocytes Relative: 3 %
Neutro Abs: 12.3 10*3/uL — ABNORMAL HIGH (ref 1.7–7.7)
Neutrophils Relative %: 89 %
Platelets: 247 10*3/uL (ref 150–400)
RBC: 3.24 MIL/uL — ABNORMAL LOW (ref 4.22–5.81)
RDW: 14.8 % (ref 11.5–15.5)
WBC: 13.8 10*3/uL — ABNORMAL HIGH (ref 4.0–10.5)
nRBC: 0 % (ref 0.0–0.2)

## 2019-12-24 LAB — URINALYSIS, ROUTINE W REFLEX MICROSCOPIC
Bilirubin Urine: NEGATIVE
Glucose, UA: 50 mg/dL — AB
Ketones, ur: NEGATIVE mg/dL
Nitrite: NEGATIVE
Protein, ur: >=300 mg/dL — AB
Specific Gravity, Urine: 1.011 (ref 1.005–1.030)
pH: 6 (ref 5.0–8.0)

## 2019-12-24 LAB — COMPREHENSIVE METABOLIC PANEL
ALT: 12 U/L (ref 0–44)
AST: 16 U/L (ref 15–41)
Albumin: 2.8 g/dL — ABNORMAL LOW (ref 3.5–5.0)
Alkaline Phosphatase: 57 U/L (ref 38–126)
Anion gap: 14 (ref 5–15)
BUN: 74 mg/dL — ABNORMAL HIGH (ref 8–23)
CO2: 18 mmol/L — ABNORMAL LOW (ref 22–32)
Calcium: 9.1 mg/dL (ref 8.9–10.3)
Chloride: 108 mmol/L (ref 98–111)
Creatinine, Ser: 4.74 mg/dL — ABNORMAL HIGH (ref 0.61–1.24)
GFR calc Af Amer: 14 mL/min — ABNORMAL LOW (ref >60)
GFR calc non Af Amer: 12 mL/min — ABNORMAL LOW (ref >60)
Glucose, Bld: 174 mg/dL — ABNORMAL HIGH (ref 70–99)
Potassium: 5.2 mmol/L — ABNORMAL HIGH (ref 3.5–5.1)
Sodium: 140 mmol/L (ref 135–145)
Total Bilirubin: 0.6 mg/dL (ref 0.3–1.2)
Total Protein: 6.4 g/dL — ABNORMAL LOW (ref 6.5–8.1)

## 2019-12-24 LAB — HEPARIN LEVEL (UNFRACTIONATED): Heparin Unfractionated: 0.21 IU/mL — ABNORMAL LOW (ref 0.30–0.70)

## 2019-12-24 LAB — SODIUM, URINE, RANDOM: Sodium, Ur: 115 mmol/L

## 2019-12-24 LAB — TROPONIN I (HIGH SENSITIVITY)
Troponin I (High Sensitivity): 481 ng/L (ref <18)
Troponin I (High Sensitivity): 1921 ng/L (ref <18)

## 2019-12-24 LAB — CREATININE, URINE, RANDOM: Creatinine, Urine: 29.16 mg/dL

## 2019-12-24 LAB — GLUCOSE, CAPILLARY
Glucose-Capillary: 134 mg/dL — ABNORMAL HIGH (ref 70–99)
Glucose-Capillary: 133 mg/dL — ABNORMAL HIGH (ref 70–99)

## 2019-12-24 LAB — CBG MONITORING, ED: Glucose-Capillary: 120 mg/dL — ABNORMAL HIGH (ref 70–99)

## 2019-12-24 LAB — SARS CORONAVIRUS 2 BY RT PCR (HOSPITAL ORDER, PERFORMED IN ~~LOC~~ HOSPITAL LAB): SARS Coronavirus 2: NEGATIVE

## 2019-12-24 LAB — BRAIN NATRIURETIC PEPTIDE: B Natriuretic Peptide: 1554.4 pg/mL — ABNORMAL HIGH (ref 0.0–100.0)

## 2019-12-24 MED ORDER — HEPARIN BOLUS VIA INFUSION
4000.0000 [IU] | Freq: Once | INTRAVENOUS | Status: AC
  Administered 2019-12-24: 4000 [IU] via INTRAVENOUS
  Filled 2019-12-24: qty 4000

## 2019-12-24 MED ORDER — ACETAMINOPHEN 650 MG RE SUPP: 650.0000 mg | Freq: Four times a day (QID) | RECTAL | Status: DC | PRN

## 2019-12-24 MED ORDER — FUROSEMIDE 10 MG/ML IJ SOLN
80.0000 mg | Freq: Once | INTRAMUSCULAR | Status: AC
  Administered 2019-12-24: 80 mg via INTRAVENOUS
  Filled 2019-12-24: qty 8

## 2019-12-24 MED ORDER — ACETAMINOPHEN 325 MG PO TABS
650.0000 mg | ORAL_TABLET | Freq: Four times a day (QID) | ORAL | Status: DC | PRN
  Administered 2019-12-25: 650 mg via ORAL
  Filled 2019-12-24: qty 2

## 2019-12-24 MED ORDER — HEPARIN (PORCINE) 25000 UT/250ML-% IV SOLN
1300.0000 [IU]/h | INTRAVENOUS | Status: DC
  Administered 2019-12-24: 900 [IU]/h via INTRAVENOUS
  Administered 2019-12-25: 1200 [IU]/h via INTRAVENOUS
  Administered 2019-12-26: 1300 [IU]/h via INTRAVENOUS
  Filled 2019-12-24 (×3): qty 250

## 2019-12-24 MED ORDER — CARVEDILOL 25 MG PO TABS
25.0000 mg | ORAL_TABLET | Freq: Two times a day (BID) | ORAL | Status: DC
  Administered 2019-12-24 – 2019-12-26 (×4): 25 mg via ORAL
  Filled 2019-12-24 (×3): qty 1
  Filled 2019-12-24: qty 8

## 2019-12-24 MED ORDER — AMLODIPINE BESYLATE 5 MG PO TABS
5.0000 mg | ORAL_TABLET | Freq: Every day | ORAL | Status: DC
  Administered 2019-12-24 – 2019-12-25 (×2): 5 mg via ORAL
  Filled 2019-12-24 (×2): qty 1

## 2019-12-24 MED ORDER — INSULIN ASPART 100 UNIT/ML ~~LOC~~ SOLN: 0.0000 [IU] | Freq: Three times a day (TID) | SUBCUTANEOUS | Status: DC

## 2019-12-24 MED ORDER — DORZOLAMIDE HCL-TIMOLOL MAL 2-0.5 % OP SOLN
1.0000 [drp] | Freq: Two times a day (BID) | OPHTHALMIC | Status: DC
  Administered 2019-12-24 – 2019-12-26 (×4): 1 [drp] via OPHTHALMIC
  Filled 2019-12-24: qty 10

## 2019-12-24 MED ORDER — LABETALOL HCL 5 MG/ML IV SOLN
5.0000 mg | INTRAVENOUS | Status: DC | PRN
  Administered 2019-12-24: 5 mg via INTRAVENOUS
  Filled 2019-12-24: qty 4

## 2019-12-24 MED ORDER — CLOPIDOGREL BISULFATE 75 MG PO TABS
75.0000 mg | ORAL_TABLET | Freq: Every day | ORAL | Status: DC
  Administered 2019-12-25 – 2019-12-26 (×2): 75 mg via ORAL
  Filled 2019-12-24 (×2): qty 1

## 2019-12-24 MED ORDER — ISOSORBIDE MONONITRATE ER 60 MG PO TB24
60.0000 mg | ORAL_TABLET | Freq: Every day | ORAL | Status: DC
  Administered 2019-12-25 – 2019-12-26 (×2): 60 mg via ORAL
  Filled 2019-12-24 (×2): qty 1

## 2019-12-24 MED ORDER — ASPIRIN EC 81 MG PO TBEC
81.0000 mg | DELAYED_RELEASE_TABLET | Freq: Every day | ORAL | Status: DC
  Administered 2019-12-25 – 2019-12-26 (×2): 81 mg via ORAL
  Filled 2019-12-24 (×2): qty 1

## 2019-12-24 MED ORDER — FINASTERIDE 5 MG PO TABS
5.0000 mg | ORAL_TABLET | Freq: Every day | ORAL | Status: DC
  Administered 2019-12-25 – 2019-12-26 (×2): 5 mg via ORAL
  Filled 2019-12-24 (×2): qty 1

## 2019-12-24 MED ORDER — NITROGLYCERIN 0.4 MG SL SUBL
0.4000 mg | SUBLINGUAL_TABLET | SUBLINGUAL | Status: DC | PRN
  Administered 2019-12-24: 0.4 mg via SUBLINGUAL
  Filled 2019-12-24: qty 1

## 2019-12-24 MED ORDER — VITAMIN B-12 1000 MCG PO TABS
1000.0000 ug | ORAL_TABLET | Freq: Every day | ORAL | Status: DC
  Administered 2019-12-25 – 2019-12-26 (×2): 1000 ug via ORAL
  Filled 2019-12-24 (×2): qty 1

## 2019-12-24 MED ORDER — ALBUTEROL SULFATE HFA 108 (90 BASE) MCG/ACT IN AERS
2.0000 | INHALATION_SPRAY | Freq: Once | RESPIRATORY_TRACT | Status: AC
  Administered 2019-12-24: 2 via RESPIRATORY_TRACT
  Filled 2019-12-24: qty 6.7

## 2019-12-24 MED ORDER — ATORVASTATIN CALCIUM 80 MG PO TABS
80.0000 mg | ORAL_TABLET | Freq: Every day | ORAL | Status: DC
  Administered 2019-12-24 – 2019-12-25 (×2): 80 mg via ORAL
  Filled 2019-12-24 (×2): qty 1

## 2019-12-24 MED ORDER — TAMSULOSIN HCL 0.4 MG PO CAPS
0.4000 mg | ORAL_CAPSULE | Freq: Every day | ORAL | Status: DC
  Administered 2019-12-24 – 2019-12-25 (×2): 0.4 mg via ORAL
  Filled 2019-12-24 (×2): qty 1

## 2019-12-24 MED ORDER — SODIUM ZIRCONIUM CYCLOSILICATE 5 G PO PACK
5.0000 g | PACK | Freq: Every day | ORAL | Status: AC
  Filled 2019-12-24: qty 1

## 2019-12-24 NOTE — ED Triage Notes (Signed)
Pt from home via ems; called out for sob getting worse throughout night; hx CHF; on lasix, been compliant; pitting edema in all extremities; lung sounds clear with ems; hypertensive 204/110; hx htn, states compliance with bp meds; received iron injection yesterday for anemia but no transfusion; pale, diaphoretic with ems; EKG shows RBBB; also endorses hx kidney disease, not on dialysis  92% RA normally does not require o2 at home, 3L with ems 98%  204/110 98% 3L HR 88 sinus RR 28 CBG 173, hx DM T 98.2 F

## 2019-12-24 NOTE — H&P (Addendum)
Date: 12/24/2019               Patient Name:  Catalino Plascencia MRN: [id number]  DOB: 10-16-1950 Age / Sex: 69 y.o., male   PCP: [name], MD         Medical Service: Internal Medicine Teaching Service         Attending Physician: Dr. Margette Fast, MD    First Contact: Dr. Marianna Payment Pager: [phone]  Second Contact: Dr. Sharon Seller  Pager: [phone]       After Hours (After 5p/  First Contact Pager: [phone]  weekends / holidays): Second Contact Pager: [phone]   Chief Complaint: Shortness of breath  History of Present Illness: Willem Klingensmith is a 69 y.o. 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 ED with acute onset shortness of breath with new oxygen requirement.   He states that the shortness of breath began last night. He also started sweating. He did not have any chest pain but had shortness of breath. He feels short of breath now being off the bipap and feels better with it on. He denies current chest pain. He has not noticed any swelling. He uses 2-3 pillows at night. He gets short of breath with lots of walking like going up and down stairs.   No recent changes in his medications. He takes lots of pills but is not sure of how many he takes    Social:  Social History   Socioeconomic History  . Marital status: Married    Spouse name: Not on file  . Number of children: Not on file  . Years of education: Not on file  . Highest education level: Not on file  Occupational History  . Not on file  Tobacco Use  . Smoking status: Former Research scientist (life sciences)  . Smokeless tobacco: Former Systems developer    Types: Chew    Quit date: 07/09/1978  . Tobacco comment: quit 1 year ago  Vaping Use  . Vaping Use: Never used  Substance and Sexual Activity  . Alcohol use: No    Alcohol/week: 1.0 standard drink    Types: 1 Cans of beer per week    Comment: quit last march/2017  . Drug use: No  .  Sexual activity: Not on file  Other Topics Concern  . Not on file  Social History Narrative  . Not on file    Family History:  Family History  Problem Relation Age of Onset  . Hypertension Mother   . Hyperlipidemia Mother   . Hyperlipidemia Father   . Colon cancer Neg Hx   . Colon polyps Neg Hx   . Esophageal cancer Neg Hx   . Rectal cancer Neg Hx   . Stomach cancer Neg Hx    Meds:  Current Meds  Medication Sig  . acetaminophen (TYLENOL) 325 MG tablet Take 1-2 tablets (325-650 mg total) by mouth every 4 (four) hours as needed for mild pain.  Marland Kitchen amLODipine (NORVASC) 5 MG tablet Take 1 tablet (5 mg total) by mouth daily.  Marland Kitchen aspirin EC 81 MG tablet Take 1 tablet (81 mg total) by mouth daily.  Marland Kitchen atorvastatin (LIPITOR) 80 MG tablet Take 1 tablet (80 mg total) by mouth at bedtime. IM program  . carvedilol (COREG) 25 MG tablet Take 1 tablet (25 mg total) by mouth 2 (two) times daily with a meal.  .  clopidogrel (PLAVIX) 75 MG tablet Take 1 tablet (75 mg total) by mouth daily.  . dorzolamide-timolol (COSOPT) 22.3-6.8 MG/ML ophthalmic solution Place 1 drop into the right eye 2 (two) times daily.  . ferrous gluconate (FERGON) 324 MG tablet Take 1 tablet (324 mg total) by mouth daily with breakfast.  . finasteride (PROSCAR) 5 MG tablet Take 1 tablet (5 mg total) by mouth daily.  . furosemide (LASIX) 80 MG tablet Take 1 tablet (80 mg total) by mouth 2 (two) times daily.  . isosorbide mononitrate (IMDUR) 60 MG 24 hr tablet Take 1 tablet (60 mg total) by mouth daily.  . melatonin 3 MG TABS tablet Take 1 tablet (3 mg total) by mouth at bedtime.  . tamsulosin (FLOMAX) 0.4 MG CAPS capsule Take 1 capsule (0.4 mg total) by mouth daily after supper.  . vitamin B-12 (CYANOCOBALAMIN) 1000 MCG tablet Take 1 tablet (1,000 mcg total) by mouth daily.     Allergies: Allergies as of 12/24/2019 - Review Complete 12/24/2019  Allergen Reaction Noted  . Cefepime Other (See Comments) 12/14/2019   Past  Medical History:  Diagnosis Date  . Anemia   . Arthritis    past hx   . Blindness    right eye  . Cardiorenal syndrome   . Cataract    removed both eyes  . Dehydration   . Diabetes ([place])   . Glaucoma   . History of CVA (cerebrovascular accident) 09/13/2015  . History of stroke 09/13/2015  . History of urinary retention   . Hyperlipidemia   . Hypertension   . NSTEMI (non-ST elevated myocardial infarction) ([place])   . Pernicious anemia 02/24/2018  . S/P TURP   . Stroke [facility])    2017- March  . Syncope 11/2019  . Tachycardia 08/26/2017  . Tubular adenoma of colon 02/2017  . Weight loss, non-intentional 08/26/2017   10 lbs between 6/18 & 2/19     Review of Systems: A complete ROS was negative except as per HPI.   Physical Exam: Blood pressure (!) 175/93, pulse 76, temperature 98.4 F (36.9 C), temperature source Oral, resp. rate 13, height 6' (1.829 m), weight 75.3 kg, SpO2 99 %. Physical Exam Constitutional:      Appearance: He is diaphoretic.  Eyes:     Extraocular Movements: Extraocular movements intact.     Pupils: Pupils are equal, round, and reactive to light.  Cardiovascular:     Rate and Rhythm: Normal rate.     Pulses: Normal pulses.     Heart sounds: Murmur heard.   Pulmonary:     Effort: Tachypnea present.     Breath sounds: Rales present.     Comments: Tachypnic on bicarb Abdominal:     General: There is no distension.     Tenderness: There is no abdominal tenderness.  Musculoskeletal:        General: Normal range of motion.     Right lower leg: Edema (1+) present.     Left lower leg: Edema (1+) present.  Neurological:     General: No focal deficit present.     Mental Status: He is alert and oriented to person, place, and time.       EKG: personally reviewed my interpretation is sinus rhythm with RBBBand non-specific EKG changes  CXR: Cardiac shadow is enlarged but stable. Aortic calcifications are again seen. Vascular congestion with mild  interstitial edema and small effusions is seen. This is slightly worse than that noted on the prior exam. No bony abnormality  is noted.  IMPRESSION: Changes consistent with CHF  Assessment & Plan by Problem: Active Problems:   * No active hospital problems. *  In Summary: Albaraa Swingle is a 69 y.o. 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   #SHOB Patient has acute onset worsening shortness of breath. On evaluation patient was on BIPAP and had a difficult time participating with interview due to tachypnea and SHOB. He states that he has a cough, but this I not new for him. He does not use oxygen at home. He does present with a new leukocytosis, but denies fever, chill, or myalgias. Patient doe have a mildly decreased bicarb concerning for hypercarbia. SHOB likely 2/2 to heart failure exacerbation Patient could have the beginnings of a pneumonia, but no significant signs of infection. Patient does shave a significant history of thrombosis, but PE is unlikely due to decreased bicarb significant for metabolic acidosis and no history of chest pain. Patient did not get an ABG prior to being started on BIPAP and it is difficult to determine if hypercarbia is the reason for his acute/mild drop in bicarb. He denies a history of COPD or smoking.  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 Patient had a cardiac cath on 11/2019 that showed mid LAD 75% stenosed, OST LAD to Pox LAD 80 % stenosed. Stents were not placed during that time due lack of angina and poor renal function.  Presented today with an elevated BMP of 1,554 and CXR showing vascular congestion with mild interstitial edema and small effusions. He was given 80 mg of IV lasix in the ED. Urine output not recorded. He states that he take is medications as prescribed and does not admit to increased oral intake/dietary indiscretion from baseline. HF exacerbation could be due to underlying pneumonia, but doesn't correlate clinically. Patient did have a treponema and SBP >200 concerning for subendocardial ischemia. He denies chest leading up to his admission. IT is possible the patient is experienced Type II NSTEMI, which would explain the elevated troponin.  - Strict I&O - Daily weights - Patient was given a dose of IV lasix 80 mg, monitor output and re-dose as needed.  - Cardiology consulted in the ED. - Patient take beta blocker in the OP setting, will continue carvedilol 25 mg BID - Continue Imdur 60 mg daily - I appreciate cardiologys assistance with this patient.  #Tropinemia: Patient presented with an elevated troponin of 481 and repeat of 1,921 and started on Heparin. Likely secondary to Type II NSTEMI in the setting of SBP > 200.Marland Kitchen Patient does have some ischemic changes/T-wave inversion on anterior leads, but denies chest pain. Patient received antiplatelet therapy today. - Continue Heparin drip. - Continue antiplatelet therapy.  - PRN nitro - I appreciate cardiologies assistance  HTN emergency vs urgency: Patient presented with SBP > 200. Currently his SBP is around 173. Concern for HTN emergency as the cause of his treponemia.  - Continue Imdur 60 mg, carvedilol 25 mg, and amlodipine 5 mg - Goal BP < 160/100. May  need to add prn labetalol for SBP > 160 and DBP > 100.   #Acute on CKD (Stage V): Hyperkalemia: Patient has a history of CKD stage V with a baseline Cr of 4.3 and presents today with an acute elevation at 4.74. Patient has had many previous admissions for worsening kidney problems, FeNa was  significant for intrinsic renal azotemia and Renal US negative for obstruction. Ultimately, nephrology was concerned about cardiorenal syndrome. Patient also has elevated potassium today at 5.2 and an elevated BUN 74. - Urinalysis with culture pending - FeNa pending - IV lasix 80 mg given in ED, repeat dose tonight and monitor urin output. - Lasix should improve potassium, but will give a single dose of lokelma and monitor closely. - Likely need tt consult nephrology for further evaluation and managment.   #DM: His last A1c was 5.9 and is not currently on DM medications. - SSI with meals - CBG q4 hours.   #Normocytic anemia: Patient has a history of anemia of chronic disease and recently received IV iron yesterday. Per nephrologies notes, he has been receiving Epo in the past for his anemia.  - Hold of on oral iron supplementation until infection ahs been ruled out.  HLD: Hx of left ICA ischemic stroke: Patient takes atorvastatin 80 mg daily, ASA 81 mg and Plavix 75 mg.  - Continue Atorvastatin 80 mg - Continue DAPT with  ASA 81 mg and Plavix 75 mg   Diet: NPO => Heart healthy fluid restricted diet. VTE: Heparin IVF: none Code: Full  Dispo: Admit patient to Inpatient with expected length of stay greater than 2 midnights.  SignedMarty Heck, DO 12/24/2019, 1:28 PM  Pager: [phone]

## 2019-12-24 NOTE — ED Provider Notes (Signed)
Emergency Department Provider Note   I have reviewed the triage vital signs and the nursing notes.   HISTORY  Chief Complaint Shortness of Breath   HPI Dennis Macias is a 69 y.o. male with past medical history reviewed below presents with shortness of breath, diaphoresis, leg swelling. Symptoms began last night.  Patient is not having chest discomfort but notes feeling intermittently very sweaty.  He has not had fever.  He has not been vaccinated for Covid.  With worsening shortness of breath he ultimately called EMS to found on scene to be hypertensive to 204/110 and diaphoretic.  He was started on 3 L nasal cannula with initial oxygen saturation in the 90s.  He did receive an iron transfusion yesterday and felt fine after that.  Patient has chronic kidney disease but is not on dialysis.  He had recent admission for syncope, and reviewed the chart, and was found to have coronary stenosis but PCI not performed. Has EF 40-45% and also found to have acute infarct on MRI during last admit. No weakness/numbness symptoms here.   Past Medical History:  Diagnosis Date  . Anemia   . Arthritis    past hx   . Blindness    right eye  . Cardiorenal syndrome   . Cataract    removed both eyes  . Dehydration   . Diabetes ([place])   . Glaucoma   . History of CVA (cerebrovascular accident) 09/13/2015  . History of stroke 09/13/2015  . History of urinary retention   . Hyperlipidemia   . Hypertension   . NSTEMI (non-ST elevated myocardial infarction) ([place])   . Pernicious anemia 02/24/2018  . S/P TURP   . Stroke [facility])    2017- March  . Syncope 11/2019  . Tachycardia 08/26/2017  . Tubular adenoma of colon 02/2017  . Weight loss, non-intentional 08/26/2017   10 lbs between 6/18 & 2/19    Patient Active Problem List   Diagnosis Date Noted  . Shortness of breath 12/24/2019  . Acute on chronic combined systolic and diastolic CHF (congestive heart failure) ([place]) 12/24/2019  . Non-Obstructive CAD  12/14/2019  . Encephalopathy, metabolic 67/59/1638  . BPH (benign prostatic hyperplasia) 12/08/2019  . Anemia of chronic disease   . Protein-calorie malnutrition, severe 11/19/2019  . Acute ischemic left ICA stroke ([place])   . Gait disorder   . Chronic combined systolic and diastolic heart failure ([place])   . Non-STEMI (non-ST elevated myocardial infarction) ([place])   . Pleural effusion transudative   . Nephrotic syndrome 08/05/2019  . Protein calorie malnutrition ([place]) 07/31/2019  . Left renal mass 07/21/2019  . CKD (chronic kidney disease) stage 4, GFR 15-29 ml/min (HCC) 04/23/2019  . Weight loss, non-intentional 08/26/2017  . 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 Hyperlipidemia 09/13/2015  . Diabetes mellitus with retinopathy of both eyes ([place])   . Essential hypertension     Past Surgical History:  Procedure Laterality Date  . CATARACT EXTRACTION, BILATERAL    . COLONOSCOPY    . IR THORACENTESIS ASP PLEURAL SPACE W/IMG GUIDE  09/21/2019  . IR THORACENTESIS ASP PLEURAL SPACE W/IMG GUIDE  10/16/2019  . LEFT HEART CATH AND CORONARY ANGIOGRAPHY N/A 11/19/2019   Procedure: LEFT HEART CATH AND CORONARY ANGIOGRAPHY;  Surgeon: Jettie Booze, MD;  Location: [facility];  Service: Cardiovascular;  Laterality: N/A;  . POLYPECTOMY    . REFRACTIVE SURGERY  10/2017  . TEE WITHOUT CARDIOVERSION N/A 09/14/2015   Procedure: TRANSESOPHAGEAL ECHOCARDIOGRAM (TEE);  Surgeon: Kirk Ruths  Claris Gladden, MD;  Location: [facility];  Service: Cardiovascular;  Laterality: N/A;  . TRANSURETHRAL RESECTION OF PROSTATE N/A 10/20/2019   Procedure: TRANSURETHRAL RESECTION OF THE PROSTATE (TURP);  Surgeon: Irine Seal, MD;  Location: [facility];  Service: Urology;  Laterality: N/A;  . UPPER GASTROINTESTINAL ENDOSCOPY      Allergies Cefepime  Family History  Problem Relation Age of Onset  . Hypertension Mother   . Hyperlipidemia Mother   . Hyperlipidemia Father   . Colon cancer Neg Hx   . Colon polyps Neg Hx   .  Esophageal cancer Neg Hx   . Rectal cancer Neg Hx   . Stomach cancer Neg Hx     Social History Social History   Tobacco Use  . Smoking status: Former Research scientist (life sciences)  . Smokeless tobacco: Former Systems developer    Types: Chew    Quit date: 07/09/1978  . Tobacco comment: quit 1 year ago  Vaping Use  . Vaping Use: Never used  Substance Use Topics  . Alcohol use: No    Alcohol/week: 1.0 standard drink    Types: 1 Cans of beer per week    Comment: quit last march/2017  . Drug use: No    Review of Systems  Constitutional: No fever/chills Eyes: No visual changes. ENT: No sore throat. Cardiovascular: Denies chest pain. Positive diaphoresis.  Respiratory: Positive shortness of breath. Gastrointestinal: No abdominal pain. Positive nausea, no vomiting.  No diarrhea.  No constipation. Genitourinary: Negative for dysuria. Musculoskeletal: Negative for back pain. Skin: Negative for rash. Neurological: Negative for headaches, focal weakness or numbness.  10-point ROS otherwise negative.  ____________________________________________   PHYSICAL EXAM:  VITAL SIGNS: ED Triage Vitals  Enc Vitals Group     BP 12/24/19 0823 (!) 187/88     Pulse Rate 12/24/19 0823 88     Resp 12/24/19 0823 (!) 25     Temp 12/24/19 0828 98.4 F (36.9 C)     Temp Source 12/24/19 0828 Oral     SpO2 12/24/19 0823 93 %     Weight 12/24/19 0825 166 lb (75.3 kg)     Height 12/24/19 0825 6' (1.829 m)   Constitutional: Alert and oriented. Able to provide a full history but appears winded and is mildly diaphoretic.  Eyes: Conjunctivae are normal.  Head: Atraumatic. Nose: No congestion/rhinnorhea. Mouth/Throat: Mucous membranes are moist.  Neck: No stridor.  Cardiovascular: Normal rate, regular rhythm. Good peripheral circulation. Grossly normal heart sounds.   Respiratory: Increased respiratory effort.  No retractions. Lungs with crackles at the bases. Audible wheezing noted bilaterally.  Gastrointestinal: Soft and  nontender. No distention.  Musculoskeletal: No lower extremity tenderness nor with 2+ pitting edema. No gross deformities of extremities. Neurologic:  Normal speech and language. No gross focal neurologic deficits are appreciated.  Skin:  Skin is warm, dry and intact. No rash noted. Mildly diaphoretic.   ____________________________________________   LABS (all labs ordered are listed, but only abnormal results are displayed)  Labs Reviewed  URINE CULTURE - Abnormal; Notable for the following components:      Result Value   Culture >=100,000 COLONIES/mL PSEUDOMONAS AERUGINOSA (*)    Organism ID, Bacteria PSEUDOMONAS AERUGINOSA (*)    All other components within normal limits  URINE CULTURE - Abnormal; Notable for the following components:   Culture >=100,000 COLONIES/mL PSEUDOMONAS AERUGINOSA (*)    Organism ID, Bacteria PSEUDOMONAS AERUGINOSA (*)    All other components within normal limits  COMPREHENSIVE METABOLIC PANEL - Abnormal; Notable for the following  components:   Potassium 5.2 (*)    CO2 18 (*)    Glucose, Bld 174 (*)    BUN 74 (*)    Creatinine, Ser 4.74 (*)    Total Protein 6.4 (*)    Albumin 2.8 (*)    GFR calc non Af Amer 12 (*)    GFR calc Af Amer 14 (*)    All other components within normal limits  BRAIN NATRIURETIC PEPTIDE - Abnormal; Notable for the following components:   B Natriuretic Peptide 1,554.4 (*)    All other components within normal limits  CBC WITH DIFFERENTIAL/PLATELET - Abnormal; Notable for the following components:   WBC 13.8 (*)    RBC 3.24 (*)    Hemoglobin 9.3 (*)    HCT 29.7 (*)    Neutro Abs 12.3 (*)    All other components within normal limits  HEPARIN LEVEL (UNFRACTIONATED) - Abnormal; Notable for the following components:   Heparin Unfractionated 0.21 (*)    All other components within normal limits  URINALYSIS, ROUTINE W REFLEX MICROSCOPIC - Abnormal; Notable for the following components:   Glucose, UA 50 (*)    Hgb urine  dipstick MODERATE (*)    Protein, ur >=300 (*)    Leukocytes,Ua SMALL (*)    Bacteria, UA FEW (*)    All other components within normal limits  COMPREHENSIVE METABOLIC PANEL - Abnormal; Notable for the following components:   CO2 19 (*)    BUN 79 (*)    Creatinine, Ser 4.98 (*)    Total Protein 5.9 (*)    Albumin 2.4 (*)    GFR calc non Af Amer 11 (*)    GFR calc Af Amer 13 (*)    All other components within normal limits  GLUCOSE, CAPILLARY - Abnormal; Notable for the following components:   Glucose-Capillary 134 (*)    All other components within normal limits  HEPARIN LEVEL (UNFRACTIONATED) - Abnormal; Notable for the following components:   Heparin Unfractionated 0.15 (*)    All other components within normal limits  CBC - Abnormal; Notable for the following components:   WBC 17.6 (*)    RBC 3.11 (*)    Hemoglobin 9.1 (*)    HCT 28.2 (*)    All other components within normal limits  GLUCOSE, CAPILLARY - Abnormal; Notable for the following components:   Glucose-Capillary 133 (*)    All other components within normal limits  GLUCOSE, CAPILLARY - Abnormal; Notable for the following components:   Glucose-Capillary 118 (*)    All other components within normal limits  HEPARIN LEVEL (UNFRACTIONATED) - Abnormal; Notable for the following components:   Heparin Unfractionated 0.27 (*)    All other components within normal limits  PROTEIN / CREATININE RATIO, URINE - Abnormal; Notable for the following components:   Protein Creatinine Ratio 15.47 (*)    All other components within normal limits  GLUCOSE, CAPILLARY - Abnormal; Notable for the following components:   Glucose-Capillary 103 (*)    All other components within normal limits  IRON AND TIBC - Abnormal; Notable for the following components:   TIBC 185 (*)    Saturation Ratios 50 (*)    All other components within normal limits  URINALYSIS, ROUTINE W REFLEX MICROSCOPIC - Abnormal; Notable for the following components:    APPearance CLOUDY (*)    Glucose, UA 50 (*)    Hgb urine dipstick MODERATE (*)    Protein, ur >=300 (*)    Leukocytes,Ua LARGE (*)  WBC, UA >50 (*)    Bacteria, UA RARE (*)    All other components within normal limits  GLUCOSE, CAPILLARY - Abnormal; Notable for the following components:   Glucose-Capillary 112 (*)    All other components within normal limits  COMPREHENSIVE METABOLIC PANEL - Abnormal; Notable for the following components:   CO2 18 (*)    BUN 84 (*)    Creatinine, Ser 5.11 (*)    Calcium 8.7 (*)    Total Protein 5.4 (*)    Albumin 2.2 (*)    GFR calc non Af Amer 11 (*)    GFR calc Af Amer 12 (*)    All other components within normal limits  CBC - Abnormal; Notable for the following components:   WBC 12.5 (*)    RBC 2.94 (*)    Hemoglobin 8.5 (*)    HCT 26.5 (*)    All other components within normal limits  GLUCOSE, CAPILLARY - Abnormal; Notable for the following components:   Glucose-Capillary 122 (*)    All other components within normal limits  CBG MONITORING, ED - Abnormal; Notable for the following components:   Glucose-Capillary 120 (*)    All other components within normal limits  TROPONIN I (HIGH SENSITIVITY) - Abnormal; Notable for the following components:   Troponin I (High Sensitivity) 481 (*)    All other components within normal limits  TROPONIN I (HIGH SENSITIVITY) - Abnormal; Notable for the following components:   Troponin I (High Sensitivity) 1,921 (*)    All other components within normal limits  SARS CORONAVIRUS 2 BY RT PCR (HOSPITAL ORDER, [facility])  CULTURE, BLOOD (ROUTINE X 2)  CULTURE, BLOOD (ROUTINE X 2)  SODIUM, URINE, RANDOM  CREATININE, URINE, RANDOM  LIPID PANEL  GLUCOSE, CAPILLARY  HEPARIN LEVEL (UNFRACTIONATED)  GLUCOSE, CAPILLARY  HEPARIN LEVEL (UNFRACTIONATED)  GLUCOSE, CAPILLARY  GLUCOSE, CAPILLARY   ____________________________________________  EKG   EKG  Interpretation  Date/Time:  Thursday December 24 2019 08:29:42 EDT Ventricular Rate:  86 PR Interval:    QRS Duration: 136 QT Interval:  413 QTC Calculation: 494 R Axis:   -75 Text Interpretation: Sinus rhythm RBBB and LAFB Left ventricular hypertrophy Nonspecific ST change No STEMI Confirmed by Nanda Quinton [phone]) on 12/24/2019 8:38:54 AM       ____________________________________________  RADIOLOGY  CXR reviewed.  ____________________________________________   PROCEDURES  Procedure(s) performed:   Procedures  CRITICAL CARE Performed by: Margette Fast Total critical care time: 35 minutes Critical care time was exclusive of separately billable procedures and treating other patients. Critical care was necessary to treat or prevent imminent or life-threatening deterioration. Critical care was time spent personally by me on the following activities: development of treatment plan with patient and/or surrogate as well as nursing, discussions with consultants, evaluation of patient's response to treatment, examination of patient, obtaining history from patient or surrogate, ordering and performing treatments and interventions, ordering and review of laboratory studies, ordering and review of radiographic studies, pulse oximetry and re-evaluation of patient's condition.  Nanda Quinton, MD Emergency Medicine  ____________________________________________   INITIAL IMPRESSION / ASSESSMENT AND PLAN / ED COURSE  Pertinent labs & imaging results that were available during my care of the patient were reviewed by me and considered in my medical decision making (see chart for details).   Patient presents to the emergency department with shortness of breath starting last night.  He is mildly diaphoretic with mild to moderate increased work of breathing.  He is able to provide a full history and speak in 5-6 word sentences.  He is not having active chest pain but possible anginal equivalent with  diaphoresis and some nausea.  EKG reviewed which does not show any acute ischemic change but progression of interventricular conduction delay noted.  Low suspicion clinically for PE or infectious process.  Patient would benefit from nitroglycerin with elevated blood pressure on my evaluation with systolics of 952.  Will give Lasix and Adderall inhaler with some wheezing here but no history of COPD or asthma.  Could be cardiac wheeze.  Patient would benefit from early BiPAP which is been ordered along with Covid testing.   Patient tolerating BiPAP well. Labs reviewed. Troponin elevation likely related to CHF exacerbation but will start Heparin.   Discussed patient's case with Medicine to request admission. Patient and family (if present) updated with plan. Care transferred to Medicine service.  I reviewed all nursing notes, vitals, pertinent old records, EKGs, labs, imaging (as available).  ____________________________________________  FINAL CLINICAL IMPRESSION(S) / ED DIAGNOSES  Final diagnoses:  Acute respiratory failure with hypoxia (HCC)  Acute congestive heart failure, unspecified heart failure type (HCC)  AKI (acute kidney injury) ([place])     MEDICATIONS GIVEN DURING THIS VISIT:  Medications  sodium zirconium cyclosilicate (LOKELMA) packet 5 g (has no administration in time range)  furosemide (LASIX) injection 80 mg (80 mg Intravenous Given 12/24/19 0904)  albuterol (VENTOLIN HFA) 108 (90 Base) MCG/ACT inhaler 2 puff (2 puffs Inhalation Given 12/24/19 0854)  heparin bolus via infusion 4,000 Units (4,000 Units Intravenous Bolus from Bag 12/24/19 1150)  furosemide (LASIX) injection 80 mg (80 mg Intravenous Given 12/24/19 1729)     NEW OUTPATIENT MEDICATIONS STARTED DURING THIS VISIT:  Discharge Medication List as of 12/26/2019 11:32 AM    START taking these medications   Details  hydrALAZINE (APRESOLINE) 50 MG tablet Take 1 tablet (50 mg total) by mouth every 8 (eight) hours.,  Starting Sat 12/26/2019, Normal    ciprofloxacin (CIPRO) 250 MG tablet Take 2 tablets (500 mg total) by mouth daily with breakfast., Starting Sun 12/27/2019, Normal        Note:  This document was prepared using Dragon voice recognition software and may include unintentional dictation errors.  Nanda Quinton, MD, [facility] Emergency Medicine    Maron Stanzione, [facility], MD 12/27/19 [phone]

## 2019-12-24 NOTE — Progress Notes (Signed)
ANTICOAGULATION CONSULT NOTE - Follow Up Consult  Pharmacy Consult for Heparin Indication: chest pain/ACS  Allergies  Allergen Reactions  . Cefepime Other (See Comments)    Pt had BAD encephalopathy from Cefepime    Patient Measurements: Height: 6' (182.9 cm) Weight: 75.3 kg (166 lb) IBW/kg (Calculated) : 77.6  Heparin Dosing Weight: 75.3 kg  Vital Signs: Temp: 99.3 F (37.4 C) (06/17 1957) Temp Source: Oral (06/17 1957) BP: 185/86 (06/17 1957) Pulse Rate: 74 (06/17 1957)  Labs: Recent Labs    12/23/19 1242 12/24/19 0852 12/24/19 1133 12/24/19 2021  HGB 9.0* 9.3*  --   --   HCT  --  29.7*  --   --   PLT  --  247  --   --   HEPARINUNFRC  --   --   --  0.21*  CREATININE  --  4.74*  --   --   TROPONINIHS  --  481* 1,921*  --     Estimated Creatinine Clearance: 15.9 mL/min (A) (by C-G formula based on SCr of 4.74 mg/dL (H)).   Medical History: Past Medical History:  Diagnosis Date  . Anemia   . Arthritis    past hx   . Blindness    right eye  . Cardiorenal syndrome   . Cataract    removed both eyes  . Dehydration   . Diabetes ([place])   . Glaucoma   . History of CVA (cerebrovascular accident) 09/13/2015  . History of stroke 09/13/2015  . History of urinary retention   . Hyperlipidemia   . Hypertension   . NSTEMI (non-ST elevated myocardial infarction) ([place])   . Pernicious anemia 02/24/2018  . S/P TURP   . Stroke [facility])    2017- March  . Syncope 11/2019  . Tachycardia 08/26/2017  . Tubular adenoma of colon 02/2017  . Weight loss, non-intentional 08/26/2017   10 lbs between 6/18 & 2/19    Assessment: 69 y.o. male with past medical history presented with shortness of breath, diaphoresis, leg swelling and elevated troponin; he was diagnosed with acute exacerbation of CHF and NSTEMI (felt by Cardiology to be demand ischemia in setting of CHF exacerbation and poor renal function). Pharmacy was consulted to dose/monitor heparin for ACS. Pt takes Plavix at home, but  no other anticoagulants PTA.  Medical hx includes HTN, HLD, CKD IV, DM, mixed CHF, CVA (2017, 11/2019), nephrotic syndrome, anemia of chronic disease.  Initial heparin level ~8.5 hrs after heparin 4000 units IV bolus, followed by heparin at 900 units/hr, was 0.21 units/ml, which is below the goal range for this pt. CBC WNL, Per RN, no issues with IV or bleeding observed.  Goal of Therapy:  Heparin level 0.3-0.7 units/ml Monitor platelets by anticoagulation protocol: Yes   Plan:  Increase heparin infusion to 1050 units/hr Check 8-hr heparin level Monitor daily heparin level, CBC Monitor for signs/symptoms of bleeding  Gillermina Hu, PharmD, BCPS, [facility] Clinical Pharmacist 12/24/2019, 9:07 PM

## 2019-12-24 NOTE — Progress Notes (Signed)
  Echocardiogram 2D Echocardiogram has been performed.  Dennis Macias 12/24/2019, 4:42 PM

## 2019-12-24 NOTE — Progress Notes (Signed)
ANTICOAGULATION CONSULT NOTE - Initial Consult  Pharmacy Consult for Heparin Indication: chest pain/ACS  Allergies  Allergen Reactions  . Cefepime Other (See Comments)    Pt had BAD encephalopathy from Cefepime    Patient Measurements: Height: 6' (182.9 cm) Weight: 75.3 kg (166 lb) IBW/kg (Calculated) : 77.6  Vital Signs: Temp: 98.4 F (36.9 C) (06/17 0828) Temp Source: Oral (06/17 0828) BP: 178/106 (06/17 1109) Pulse Rate: 80 (06/17 1109)  Labs: Recent Labs    12/23/19 1242 12/24/19 0852  HGB 9.0* 9.3*  HCT  --  29.7*  PLT  --  247  CREATININE  --  4.74*  TROPONINIHS  --  481*    Estimated Creatinine Clearance: 15.9 mL/min (A) (by C-G formula based on SCr of 4.74 mg/dL (H)).   Medical History: Past Medical History:  Diagnosis Date  . Anemia   . Arthritis    past hx   . Blindness    right eye  . Cardiorenal syndrome   . Cataract    removed both eyes  . Dehydration   . Diabetes ([place])   . Glaucoma   . History of CVA (cerebrovascular accident) 09/13/2015  . History of stroke 09/13/2015  . History of urinary retention   . Hyperlipidemia   . Hypertension   . NSTEMI (non-ST elevated myocardial infarction) ([place])   . Pernicious anemia 02/24/2018  . S/P TURP   . Stroke [facility])    2017- March  . Syncope 11/2019  . Tachycardia 08/26/2017  . Tubular adenoma of colon 02/2017  . Weight loss, non-intentional 08/26/2017   10 lbs between 6/18 & 2/19    Assessment: 69 y.o. male with past medical history reviewed below presents with shortness of breath, diaphoresis, leg swelling. Symptoms began last night.  Patient is not having chest discomfort but notes feeling intermittently very sweaty.  Pharmacy is consulted for heparin dosing for ACS. Patient on Plavix at home, no other anticoagulants PTA.  Goal of Therapy:  Heparin level 0.3-0.7 units/ml Monitor platelets by anticoagulation protocol: Yes   Plan:  Give 4000 units bolus x 1 Start heparin infusion at 900  units/hr Check anti-Xa level in 8 hours and daily while on heparin Continue to monitor H&H and platelets  Alanda Slim, PharmD, [facility] Clinical Pharmacist Please see AMION for all Pharmacists' Contact Phone Numbers 12/24/2019, 11:32 AM

## 2019-12-24 NOTE — ED Notes (Signed)
Pt taken off of Bi-Pap by resp.  Pt tolerating well at this time

## 2019-12-24 NOTE — Consult Note (Addendum)
Cardiology Consultation:   Patient ID: Dennis Macias; [id number]; 1951-02-11   Admit date: 12/24/2019 Date of Consult: 12/24/2019  Primary Care Provider: [name], MD Primary Cardiologist: Larae Grooms, MD 11/19/2019 in-hospital Primary Electrophysiologist:  None   Patient Profile:   Dennis Macias is a 69 y.o. male with a hx of CAD, ICM, HFrEF, HTN, HLD, nephrotic syndrome, CKD stage IV, anemia of chronic disease, BPH with chronic indwelling foley catheter, and hx of stroke in 2017, who is being seen today for the evaluation of CHF at the request of Dr Laverta Baltimore.  History of Present Illness:   Dennis Macias was hospitalized 4/30-5/19/2021 with syncope, Lasix and Isordil discontinued, etiology unclear.  He also had Pseudomonas bacteremia and a UTI, acute CVA in the parietal region on ASA/Plavix, non-STEMI by enzymes with noncritical CAD at cath, PCI 80% LAD deferred secondary to poor renal function, decompensated CHF.  Admitted to inpatient rehab 5/19-5/28/2021, creatinine 4.31 at discharge, hemoglobin 8.2.  Seen by nephrology and Lasix increased to 80 mg twice daily at discharge, weights stable  Admitted early on 6/17 with increasing shortness of breath, diaphoresis and lower extremity edema.  Symptoms started the night before admission.  He was hypertensive at 204/110 on admission volume overload on exam, both BNP and troponin elevated, cards asked to see.  Dennis Macias was weighing himself after he went home from rehab, but says his weight did not change very much.  He stated that his respiratory status was stable until last night.  He did not really notice any changes until last night when he became short of breath.  At that time, he developed orthopnea and PND.  He thinks he had some lower extremity edema but not much.  He came to the hospital today because the symptoms worsened.  Since being in the hospital, getting Lasix and being placed on BiPAP, he is breathing easier.  He has not had  any chest pain.  He has not had any palpitations.  He has not had any presyncope or syncope.  He does not add salt to foods.  He does not think he drinks too much liquids.  He does not check his blood pressure at home.  The home health nurses come out and check it.  He does not think it has been running high.    Past Medical History:  Diagnosis Date  . Anemia   . Arthritis    past hx   . Blindness    right eye  . Cardiorenal syndrome   . Cataract    removed both eyes  . Dehydration   . Diabetes ([place])   . Glaucoma   . History of CVA (cerebrovascular accident) 09/13/2015  . History of stroke 09/13/2015  . History of urinary retention   . Hyperlipidemia   . Hypertension   . NSTEMI (non-ST elevated myocardial infarction) ([place])   . Pernicious anemia 02/24/2018  . S/P TURP   . Stroke [facility])    2017- March  . Syncope 11/2019  . Tachycardia 08/26/2017  . Tubular adenoma of colon 02/2017  . Weight loss, non-intentional 08/26/2017   10 lbs between 6/18 & 2/19    Past Surgical History:  Procedure Laterality Date  . CATARACT EXTRACTION, BILATERAL    . COLONOSCOPY    . IR THORACENTESIS ASP PLEURAL SPACE W/IMG GUIDE  09/21/2019  . IR THORACENTESIS ASP PLEURAL SPACE W/IMG GUIDE  10/16/2019  . LEFT HEART CATH AND CORONARY ANGIOGRAPHY N/A 11/19/2019   Procedure: LEFT HEART CATH AND CORONARY  ANGIOGRAPHY;  Surgeon: Jettie Booze, MD;  Location: [facility];  Service: Cardiovascular;  Laterality: N/A;  . POLYPECTOMY    . REFRACTIVE SURGERY  10/2017  . TEE WITHOUT CARDIOVERSION N/A 09/14/2015   Procedure: TRANSESOPHAGEAL ECHOCARDIOGRAM (TEE);  Surgeon: Larey Dresser, MD;  Location: [facility];  Service: Cardiovascular;  Laterality: N/A;  . TRANSURETHRAL RESECTION OF PROSTATE N/A 10/20/2019   Procedure: TRANSURETHRAL RESECTION OF THE PROSTATE (TURP);  Surgeon: Irine Seal, MD;  Location: [facility];  Service: Urology;  Laterality: N/A;  . UPPER GASTROINTESTINAL ENDOSCOPY        Prior to Admission medications   Medication Sig Start Date End Date Taking? Authorizing Provider  acetaminophen (TYLENOL) 325 MG tablet Take 1-2 tablets (325-650 mg total) by mouth every 4 (four) hours as needed for mild pain. 12/03/19  Yes Angiulli, Lavon Paganini, PA-C  amLODipine (NORVASC) 5 MG tablet Take 1 tablet (5 mg total) by mouth daily. 12/03/19  Yes Angiulli, Lavon Paganini, PA-C  aspirin EC 81 MG tablet Take 1 tablet (81 mg total) by mouth daily. 10/22/19  Yes Eugenie Filler, MD  atorvastatin (LIPITOR) 80 MG tablet Take 1 tablet (80 mg total) by mouth at bedtime. IM program 12/03/19  Yes Angiulli, Lavon Paganini, PA-C  carvedilol (COREG) 25 MG tablet Take 1 tablet (25 mg total) by mouth 2 (two) times daily with a meal. 12/03/19  Yes Angiulli, Lavon Paganini, PA-C  clopidogrel (PLAVIX) 75 MG tablet Take 1 tablet (75 mg total) by mouth daily. 12/03/19  Yes Angiulli, Lavon Paganini, PA-C  dorzolamide-timolol (COSOPT) 22.3-6.8 MG/ML ophthalmic solution Place 1 drop into the right eye 2 (two) times daily. 12/03/19  Yes Angiulli, Lavon Paganini, PA-C  ferrous gluconate (FERGON) 324 MG tablet Take 1 tablet (324 mg total) by mouth daily with breakfast. 12/03/19  Yes Angiulli, Lavon Paganini, PA-C  finasteride (PROSCAR) 5 MG tablet Take 1 tablet (5 mg total) by mouth daily. 12/03/19  Yes Angiulli, Lavon Paganini, PA-C  furosemide (LASIX) 80 MG tablet Take 1 tablet (80 mg total) by mouth 2 (two) times daily. 12/15/19  Yes Lucious Groves, DO  isosorbide mononitrate (IMDUR) 60 MG 24 hr tablet Take 1 tablet (60 mg total) by mouth daily. 12/03/19 01/02/20 Yes Angiulli, Lavon Paganini, PA-C  melatonin 3 MG TABS tablet Take 1 tablet (3 mg total) by mouth at bedtime. 12/03/19  Yes Angiulli, Lavon Paganini, PA-C  tamsulosin (FLOMAX) 0.4 MG CAPS capsule Take 1 capsule (0.4 mg total) by mouth daily after supper. 12/03/19  Yes Angiulli, Lavon Paganini, PA-C  vitamin B-12 (CYANOCOBALAMIN) 1000 MCG tablet Take 1 tablet (1,000 mcg total) by mouth daily. 12/03/19  Yes Angiulli, Lavon Paganini, PA-C  Accu-Chek FastClix Lancets MISC Check blood sugar up to 7 times a week as instructed 12/03/19   Angiulli, Lavon Paganini, PA-C  Blood Glucose Monitoring Suppl (ACCU-CHEK GUIDE) w/Device KIT 1 each by Does not apply route daily. Check blood sugar as instructed up to 7 times a week 12/03/19   Angiulli, Lavon Paganini, PA-C  glucose blood (ACCU-CHEK GUIDE) test strip Check blood sugar up to 7 times a week as instructed 12/03/19   Cathlyn Parsons, PA-C    Inpatient Medications: Scheduled Meds: . amLODipine  5 mg Oral Daily  . [START ON 12/25/2019] aspirin EC  81 mg Oral Daily  . atorvastatin  80 mg Oral QHS  . carvedilol  25 mg Oral BID WC  . clopidogrel  75 mg Oral Daily  . dorzolamide-timolol  1  drop Right Eye BID  . finasteride  5 mg Oral Daily  . insulin aspart  0-9 Units Subcutaneous TID WC  . isosorbide mononitrate  60 mg Oral Daily  . tamsulosin  0.4 mg Oral QPC supper  . vitamin B-12  1,000 mcg Oral Daily   Continuous Infusions: . heparin 900 Units/hr (12/24/19 1145)   PRN Meds: acetaminophen **OR** acetaminophen, nitroGLYCERIN  Allergies:    Allergies  Allergen Reactions  . Cefepime Other (See Comments)    Pt had BAD encephalopathy from Cefepime    Social History:   Social History   Socioeconomic History  . Marital status: Married    Spouse name: Not on file  . Number of children: Not on file  . Years of education: Not on file  . Highest education level: Not on file  Occupational History  . Not on file  Tobacco Use  . Smoking status: Former Research scientist (life sciences)  . Smokeless tobacco: Former Systems developer    Types: Chew    Quit date: 07/09/1978  . Tobacco comment: quit 1 year ago  Vaping Use  . Vaping Use: Never used  Substance and Sexual Activity  . Alcohol use: No    Alcohol/week: 1.0 standard drink    Types: 1 Cans of beer per week    Comment: quit last march/2017  . Drug use: No  . Sexual activity: Not on file  Other Topics Concern  . Not on file  Social History Narrative  .  Not on file   Social Determinants of Health   Financial Resource Strain:   . Difficulty of Paying Living Expenses:   Food Insecurity:   . Worried About Charity fundraiser in the Last Year:   . Arboriculturist in the Last Year:   Transportation Needs:   . Film/video editor (Medical):   Marland Kitchen Lack of Transportation (Non-Medical):   Physical Activity:   . Days of Exercise per Week:   . Minutes of Exercise per Session:   Stress:   . Feeling of Stress :   Social Connections:   . Frequency of Communication with Friends and Family:   . Frequency of Social Gatherings with Friends and Family:   . Attends Religious Services:   . Active Member of Clubs or Organizations:   . Attends Archivist Meetings:   Marland Kitchen Marital Status:   Intimate Partner Violence:   . Fear of Current or Ex-Partner:   . Emotionally Abused:   Marland Kitchen Physically Abused:   . Sexually Abused:     Family History:   Family History  Problem Relation Age of Onset  . Hypertension Mother   . Hyperlipidemia Mother   . Hyperlipidemia Father   . Colon cancer Neg Hx   . Colon polyps Neg Hx   . Esophageal cancer Neg Hx   . Rectal cancer Neg Hx   . Stomach cancer Neg Hx    Family Status:  Family Status  Relation Name Status  . Mother  Deceased  . Father  Deceased  . Neg Hx  (Not Specified)    ROS:  Please see the history of present illness.  All other ROS reviewed and negative.     Physical Exam/Data:   Vitals:   12/24/19 1344 12/24/19 1359 12/24/19 1414 12/24/19 1452  BP: (!) 169/90 (!) 176/90 (!) 176/92 (!) 172/103  Pulse: 72 73 73 74  Resp: 11 13 15 18   Temp:      TempSrc:  SpO2: 99% 99% 98% 100%  Weight:      Height:       No intake or output data in the 24 hours ending 12/24/19 1602  Last 3 Weights 12/24/2019 12/21/2019 12/14/2019  Weight (lbs) 166 lb (No Data) 170 lb  Weight (kg) 75.297 kg (No Data) 77.111 kg     Body mass index is 22.51 kg/m.   General:  Well nourished, well developed,  male in no acute distress HEENT: normal Lymph: no adenopathy Neck: JVD -12 cm Endocrine:  No thryomegaly Vascular: No carotid bruits; 4/4 extremity pulses 2+  Cardiac:  normal S1, S2; RRR; no murmur Lungs: Bibasilar Rales, no wheezing, rhonchi  Abd: soft, nontender, no hepatomegaly  Ext: 1+ edema Musculoskeletal:  No deformities, BUE and BLE strength normal and equal Skin: warm and dry  Neuro:  CNs 2-12 intact, no focal abnormalities noted Psych:  Normal affect   EKG:  The EKG was personally reviewed and demonstrates: Sinus rhythm, heart rate 86, RBBB and LAFB were incomplete on 15-Nov-2022 ECG, more pronounced now Telemetry:  Telemetry was personally reviewed and demonstrates: Sinus rhythm   CV studies:   ECHO: 10/15/2019 1. Left ventricular ejection fraction, by estimation, is 40 to 45%. The  left ventricle has mildly decreased function. The left ventricle  demonstrates regional wall motion abnormalities (see scoring  diagram/findings for description). There is mild  asymmetric left ventricular hypertrophy of the septal segment. Left  ventricular diastolic parameters are consistent with Grade I diastolic  dysfunction (impaired relaxation).  2. Right ventricular systolic function is normal. The right ventricular  size is normal. There is mildly elevated pulmonary artery systolic  pressure.  3. Left atrial size was severely dilated.  4. Moderate pleural effusion.  5. The mitral valve is normal in structure. Mild mitral valve  regurgitation. No evidence of mitral stenosis.  6. The aortic valve is tricuspid. Aortic valve regurgitation is not  visualized. Mild to moderate aortic valve sclerosis/calcification is  present, without any evidence of aortic stenosis.  7. The inferior vena cava is normal in size with greater than 50%  respiratory variability, suggesting right atrial pressure of 3 mmHg.   CATH: 11/19/2019  Mid LAD lesion is 75% stenosed.  Ost LAD to Prox LAD lesion is  80% stenosed. In the cranial views, it is seen that the disease extends up to the ostial LAD, and there is significant calcification.  LV end diastolic pressure is normal.  There is no aortic valve stenosis.   PCI of the proximal - ostial LAD would likely require IVUS with possible atherectomy.  There is a mid to distal LAD lesion which is eccentric.    Given his lack of angina and issues with renal insufficiency, would not pursue PCI at this time.  Would reconsider based on his renal function.   Diagnostic Dominance: Left   Laboratory Data:   Chemistry Recent Labs  Lab 12/24/19 0852  NA 140  K 5.2*  CL 108  CO2 18*  GLUCOSE 174*  BUN 74*  CREATININE 4.74*  CALCIUM 9.1  GFRNONAA 12*  GFRAA 14*  ANIONGAP 14    Lab Results  Component Value Date   ALT 12 12/24/2019   AST 16 12/24/2019   ALKPHOS 57 12/24/2019   BILITOT 0.6 12/24/2019   Hematology Recent Labs  Lab 12/23/19 1242 12/24/19 0852  WBC  --  13.8*  RBC  --  3.24*  HGB 9.0* 9.3*  HCT  --  29.7*  MCV  --  91.7  MCH  --  28.7  MCHC  --  31.3  RDW  --  14.8  PLT  --  247   Cardiac Enzymes High Sensitivity Troponin:   Recent Labs  Lab 12/24/19 0852 12/24/19 1133  TROPONINIHS 481* 1,921*      BNP Recent Labs  Lab 12/24/19 0852  BNP 1,554.4*     TSH:  Lab Results  Component Value Date   TSH 4.534 (H) 11/13/2019   Lipids: Lab Results  Component Value Date   CHOL 108 11/14/2019   HDL 33 (L) 11/14/2019   LDLCALC 56 11/14/2019   TRIG 95 11/14/2019   CHOLHDL 3.3 11/14/2019   HgbA1c: Lab Results  Component Value Date   HGBA1C 5.9 (H) 11/14/2019   Magnesium:  Magnesium  Date Value Ref Range Status  11/20/2019 2.1 1.7 - 2.4 mg/dL Final    Comment:    Performed at [facility], [place] [address], [place], [place] [address]     Radiology/Studies:  DG Chest Portable 1 View  Result Date: 12/24/2019 CLINICAL DATA:  Shortness of breath EXAM: PORTABLE CHEST 1 VIEW COMPARISON:   11/29/2019 FINDINGS: Cardiac shadow is enlarged but stable. Aortic calcifications are again seen. Vascular congestion with mild interstitial edema and small effusions is seen. This is slightly worse than that noted on the prior exam. No bony abnormality is noted. IMPRESSION: Changes consistent with CHF Electronically Signed   By: Inez Catalina M.D.   On: 12/24/2019 09:28    Assessment and Plan:   1.  Acute on chronic combined systolic and diastolic CHF: -His EF is only mildly decreased and he only has grade 1 diastolic dysfunction -Suspect CHF exacerbation is mainly due to poor renal function, feel management is mainly limited by his poor renal function -Consider Nephrology consult and deferring diuretic management to them. -He has had improvement with the Lasix 80 mg IV x2 doses and the BiPAP   2.  Non-STEMI: -Feel this is demand ischemia in the setting of CHF exacerbation and poor renal function -He denies any ischemic symptoms. -Continue aspirin, beta-blocker, high-dose statin, Plavix -80% LAD seen at recent cath with medical therapy. 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   SignedRosaria Ferries, PA-C  12/24/2019 4:02 PM  Patient seen and examined with APP.  Agree as above, with the following exceptions and changes as noted below. Mr.  Macias was seen while in the ER room 19. He is feeling slightly better after lasix IV 80 mg x 2 and is off of BiPAP. Gen: NAD, CV: RRR, no murmurs, Lungs: clear, Abd: soft, Extrem: Warm, well perfused, 1+ edema, Neuro/Psych: alert and oriented x 3, normal mood and affect. All available labs, radiology testing, previous records reviewed. He has CKD stage for and is followed by nephrology. Recommend nephrology guidance of diuresis. He is responding well  thus far to lasix IV. Agree as above, with a known 80% LAD lesion, this troponin elevation which is significant still likely represents demand ischemia. Will follow up echocardiogram results. PCI previously deferred due to lack of angina and need for complex PCI with rotational atherectomy if performed. We may want to reevaluate lesion with interventional cardiology. Diuresis and hypertension control to be driven by Nephrology.   Elouise Munroe, MD

## 2019-12-24 NOTE — ED Notes (Signed)
Dr. Laverta Baltimore aware of elevated trop

## 2019-12-24 NOTE — Chronic Care Management (AMB) (Signed)
  Care Management   Social Work Note  12/24/2019 Name: Dennis Macias MRN: [id number] DOB: 12-Jun-1951   Met with patient and spouse at [facility] on 12/10/19 as they were requesting assistance with [place].  Explained to patient and spouse that PCS is only covered by Medicaid.  They reported that patient had previously applied for Medicaid but did not qualify.  Explained that private pay would be only option for long term, in home care services if patient does not have Medicaid.  They declined any further assistance at that time.   Patient's spouse left voicemail message for BSW on 12/23/19 requesting call back.  Returned call today.  Patient at ED at time of call.  Appears that plan is for inpatient admission.  Spouse plans to request assistance from inpatient Social Worker for [name] application as they would like to see if he qualifies at this time.  Will plan to follow up with spouse/patient upon discharge but did encourage her to call back prior to that if needed.    Ronn Melena, [place] Coordination Social Worker [place] [phone]

## 2019-12-24 NOTE — Plan of Care (Signed)
  Problem: Safety: Goal: Ability to remain free from injury will improve Outcome: Progressing   Problem: Activity: Goal: Capacity to carry out activities will improve Outcome: Progressing   

## 2019-12-25 ENCOUNTER — Inpatient Hospital Stay (HOSPITAL_COMMUNITY): Payer: Medicare PPO

## 2019-12-25 DIAGNOSIS — I10 Essential (primary) hypertension: Secondary | ICD-10-CM

## 2019-12-25 DIAGNOSIS — I248 Other forms of acute ischemic heart disease: Secondary | ICD-10-CM

## 2019-12-25 DIAGNOSIS — I5043 Acute on chronic combined systolic (congestive) and diastolic (congestive) heart failure: Secondary | ICD-10-CM

## 2019-12-25 LAB — IRON AND TIBC
Iron: 93 ug/dL (ref 45–182)
Saturation Ratios: 50 % — ABNORMAL HIGH (ref 17.9–39.5)
TIBC: 185 ug/dL — ABNORMAL LOW (ref 250–450)
UIBC: 92 ug/dL

## 2019-12-25 LAB — URINALYSIS, ROUTINE W REFLEX MICROSCOPIC
Bilirubin Urine: NEGATIVE
Glucose, UA: 50 mg/dL — AB
Ketones, ur: NEGATIVE mg/dL
Nitrite: NEGATIVE
Protein, ur: >=300 mg/dL — AB
Specific Gravity, Urine: 1.012 (ref 1.005–1.030)
WBC, UA: >50 WBC/hpf — ABNORMAL HIGH (ref 0–5)
pH: 6 (ref 5.0–8.0)

## 2019-12-25 LAB — COMPREHENSIVE METABOLIC PANEL
ALT: 12 U/L (ref 0–44)
AST: 17 U/L (ref 15–41)
Albumin: 2.4 g/dL — ABNORMAL LOW (ref 3.5–5.0)
Alkaline Phosphatase: 53 U/L (ref 38–126)
Anion gap: 12 (ref 5–15)
BUN: 79 mg/dL — ABNORMAL HIGH (ref 8–23)
CO2: 19 mmol/L — ABNORMAL LOW (ref 22–32)
Calcium: 9 mg/dL (ref 8.9–10.3)
Chloride: 111 mmol/L (ref 98–111)
Creatinine, Ser: 4.98 mg/dL — ABNORMAL HIGH (ref 0.61–1.24)
GFR calc Af Amer: 13 mL/min — ABNORMAL LOW (ref >60)
GFR calc non Af Amer: 11 mL/min — ABNORMAL LOW (ref >60)
Glucose, Bld: 99 mg/dL (ref 70–99)
Potassium: 4.8 mmol/L (ref 3.5–5.1)
Sodium: 142 mmol/L (ref 135–145)
Total Bilirubin: 0.5 mg/dL (ref 0.3–1.2)
Total Protein: 5.9 g/dL — ABNORMAL LOW (ref 6.5–8.1)

## 2019-12-25 LAB — PROTEIN / CREATININE RATIO, URINE
Creatinine, Urine: 43.64 mg/dL
Protein Creatinine Ratio: 15.47 mg/mg{Cre} — ABNORMAL HIGH (ref 0.00–0.15)
Total Protein, Urine: 675 mg/dL

## 2019-12-25 LAB — GLUCOSE, CAPILLARY
Glucose-Capillary: 118 mg/dL — ABNORMAL HIGH (ref 70–99)
Glucose-Capillary: 103 mg/dL — ABNORMAL HIGH (ref 70–99)
Glucose-Capillary: 87 mg/dL (ref 70–99)
Glucose-Capillary: 112 mg/dL — ABNORMAL HIGH (ref 70–99)
Glucose-Capillary: 90 mg/dL (ref 70–99)

## 2019-12-25 LAB — CBC
HCT: 28.2 % — ABNORMAL LOW (ref 39.0–52.0)
Hemoglobin: 9.1 g/dL — ABNORMAL LOW (ref 13.0–17.0)
MCH: 29.3 pg (ref 26.0–34.0)
MCHC: 32.3 g/dL (ref 30.0–36.0)
MCV: 90.7 fL (ref 80.0–100.0)
Platelets: 243 10*3/uL (ref 150–400)
RBC: 3.11 MIL/uL — ABNORMAL LOW (ref 4.22–5.81)
RDW: 15.1 % (ref 11.5–15.5)
WBC: 17.6 10*3/uL — ABNORMAL HIGH (ref 4.0–10.5)
nRBC: 0 % (ref 0.0–0.2)

## 2019-12-25 LAB — LIPID PANEL
Cholesterol: 120 mg/dL (ref 0–200)
HDL: 58 mg/dL (ref >40)
LDL Cholesterol: 48 mg/dL (ref 0–99)
Total CHOL/HDL Ratio: 2.1 RATIO
Triglycerides: 68 mg/dL (ref <150)
VLDL: 14 mg/dL (ref 0–40)

## 2019-12-25 LAB — HEPARIN LEVEL (UNFRACTIONATED)
Heparin Unfractionated: 0.15 IU/mL — ABNORMAL LOW (ref 0.30–0.70)
Heparin Unfractionated: 0.27 IU/mL — ABNORMAL LOW (ref 0.30–0.70)

## 2019-12-25 MED ORDER — AMLODIPINE BESYLATE 10 MG PO TABS
10.0000 mg | ORAL_TABLET | Freq: Every day | ORAL | Status: DC
  Administered 2019-12-26: 10 mg via ORAL
  Filled 2019-12-25: qty 1

## 2019-12-25 MED ORDER — CIPROFLOXACIN HCL 250 MG PO TABS
250.0000 mg | ORAL_TABLET | Freq: Every day | ORAL | Status: DC
  Administered 2019-12-25 – 2019-12-26 (×2): 250 mg via ORAL
  Filled 2019-12-25 (×2): qty 1

## 2019-12-25 MED ORDER — HYDRALAZINE HCL 50 MG PO TABS
50.0000 mg | ORAL_TABLET | Freq: Three times a day (TID) | ORAL | Status: DC
  Administered 2019-12-25 – 2019-12-26 (×3): 50 mg via ORAL
  Filled 2019-12-25 (×3): qty 1

## 2019-12-25 MED ORDER — FUROSEMIDE 80 MG PO TABS
80.0000 mg | ORAL_TABLET | Freq: Two times a day (BID) | ORAL | Status: DC
  Administered 2019-12-25 – 2019-12-26 (×2): 80 mg via ORAL
  Filled 2019-12-25 (×2): qty 1

## 2019-12-25 NOTE — Progress Notes (Signed)
Progress Note  Patient Name: Dennis Macias Date of Encounter: 12/25/2019  [facility] Cardiologist: Larae Grooms, MD   Subjective   He tells me he feels well. Family at beside, notes that he is much improved compared to yesterday. Shortness of breath came on suddenly, but now feels near baseline.  Inpatient Medications    Scheduled Meds: . [START ON 12/26/2019] amLODipine  10 mg Oral Daily  . aspirin EC  81 mg Oral Daily  . atorvastatin  80 mg Oral QHS  . carvedilol  25 mg Oral BID WC  . ciprofloxacin  250 mg Oral Q breakfast  . clopidogrel  75 mg Oral Daily  . dorzolamide-timolol  1 drop Right Eye BID  . finasteride  5 mg Oral Daily  . furosemide  80 mg Oral BID  . hydrALAZINE  50 mg Oral Q8H  . insulin aspart  0-9 Units Subcutaneous TID WC  . isosorbide mononitrate  60 mg Oral Daily  . tamsulosin  0.4 mg Oral QPC supper  . vitamin B-12  1,000 mcg Oral Daily   Continuous Infusions: . heparin 1,300 Units/hr (12/25/19 1541)   PRN Meds: acetaminophen **OR** acetaminophen, labetalol, nitroGLYCERIN   Vital Signs    Vitals:   12/25/19 0100 12/25/19 0555 12/25/19 0728 12/25/19 1116  BP: (!) 167/75 (!) 155/81 (!) 168/85 (!) 142/71  Pulse: 73 75 72 65  Resp: 19 19 18 16   Temp: 99.1 F (37.3 C) 99.5 F (37.5 C) 100.1 F (37.8 C) 98.8 F (37.1 C)  TempSrc: Oral Oral Oral Oral  SpO2: 96% 96% 95% 98%  Weight:  71.7 kg    Height:        Intake/Output Summary (Last 24 hours) at 12/25/2019 1648 Last data filed at 12/25/2019 1325 Gross per 24 hour  Intake 724.58 ml  Output 1425 ml  Net -700.42 ml   Last 3 Weights 12/25/2019 12/24/2019 12/21/2019  Weight (lbs) 158 lb 166 lb (No Data)  Weight (kg) 71.668 kg 75.297 kg (No Data)      Telemetry    NSR - Personally Reviewed  ECG    NSR, RBBB - Personally Reviewed  Physical Exam   GEN: No acute distress.   Neck: No JVD sitting upright Cardiac: RRR, no murmurs, rubs, or gallops.  Respiratory: Clear to  auscultation bilaterally with faint bibasilar rales GI: Soft, nontender, non-distended  MS: bilateral trace to 1+ LE edema; No deformity. Neuro:  Nonfocal  Psych: Normal affect   Labs    High Sensitivity Troponin:   Recent Labs  Lab 12/24/19 0852 12/24/19 1133  TROPONINIHS 481* 1,921*      Chemistry Recent Labs  Lab 12/24/19 0852 12/25/19 0519  NA 140 142  K 5.2* 4.8  CL 108 111  CO2 18* 19*  GLUCOSE 174* 99  BUN 74* 79*  CREATININE 4.74* 4.98*  CALCIUM 9.1 9.0  PROT 6.4* 5.9*  ALBUMIN 2.8* 2.4*  AST 16 17  ALT 12 12  ALKPHOS 57 53  BILITOT 0.6 0.5  GFRNONAA 12* 11*  GFRAA 14* 13*  ANIONGAP 14 12     Hematology Recent Labs  Lab 12/23/19 1242 12/24/19 0852 12/25/19 0519  WBC  --  13.8* 17.6*  RBC  --  3.24* 3.11*  HGB 9.0* 9.3* 9.1*  HCT  --  29.7* 28.2*  MCV  --  91.7 90.7  MCH  --  28.7 29.3  MCHC  --  31.3 32.3  RDW  --  14.8 15.1  PLT  --  247 243    BNP Recent Labs  Lab 12/24/19 0852  BNP 1,554.4*     DDimer No results for input(s): DDIMER in the last 168 hours.   Radiology    US RENAL  Result Date: 12/25/2019 CLINICAL DATA:  Acute renal insufficiency EXAM: RENAL / URINARY TRACT ULTRASOUND COMPLETE COMPARISON:  11/29/2019 FINDINGS: Right Kidney: Renal measurements: 12.0 x 4.9 x 5.5 cm = volume: 169 mL . No hydronephrosis. Normal renal echogenicity and cortical thickness. Trace perinephric fluid. Left Kidney: Renal measurements: 11.3 x 5.7 x 4.6 cm = volume: 155 mL. No hydronephrosis. Normal echogenicity and renal cortical thickness. Trace perinephric fluid. Bladder: Appears thick walled, but is underdistended. Other: Bilateral pleural effusions incidentally noted. IMPRESSION: 1. No hydronephrosis or other explanation for renal insufficiency. 2. Bladder wall thickening which could be artifactual in the setting of underdistention. Correlate with any symptoms of cystitis or bladder outlet obstruction. 3. Bilateral pleural effusions.  Electronically Signed   By: Abigail Miyamoto M.D.   On: 12/25/2019 09:19   DG Chest Portable 1 View  Result Date: 12/24/2019 CLINICAL DATA:  Shortness of breath EXAM: PORTABLE CHEST 1 VIEW COMPARISON:  11/29/2019 FINDINGS: Cardiac shadow is enlarged but stable. Aortic calcifications are again seen. Vascular congestion with mild interstitial edema and small effusions is seen. This is slightly worse than that noted on the prior exam. No bony abnormality is noted. IMPRESSION: Changes consistent with CHF Electronically Signed   By: Inez Catalina M.D.   On: 12/24/2019 09:28   ECHOCARDIOGRAM COMPLETE  Result Date: 12/24/2019    ECHOCARDIOGRAM REPORT   Patient Name:   Dennis Macias Date of Exam: 12/24/2019 Medical Rec #:  [id number]  Height:       72.0 in Accession #:    [id number] Weight:       166.0 lb Date of Birth:  Feb 08, 1951  BSA:          1.968 m Patient Age:    69 years   BP:           171/92 mmHg Patient Gender: M          HR:           76 bpm. Exam Location:  Inpatient Procedure: 2D Echo, Cardiac Doppler and Color Doppler                         STAT ECHO Reported to: Dr Boykin Reaper on 12/24/2019 4:38:00 PM. Indications:    CHF-Acute Systolic  History:        Patient has prior history of Echocardiogram examinations, most                 recent 10/15/2019. Signs/Symptoms:Shortness of Breath; Risk                 Factors:Hypertension, Diabetes, Dyslipidemia and Former Smoker.                 CKD.  Sonographer:    Clayton Lefort RDCS (AE) Referring Phys: [id number] [place]  1. Left ventricular ejection fraction, by estimation, is 35 to 40%. The left ventricle has moderately decreased function. The left ventricle demonstrates global hypokinesis. The left ventricular internal cavity size was mildly dilated. There is mild left ventricular hypertrophy. Left ventricular diastolic parameters are consistent with Grade II diastolic dysfunction (pseudonormalization).  2. Right ventricular systolic function is  normal. The right ventricular size is normal. There is moderately elevated pulmonary artery systolic pressure.  3. Left atrial size was moderately dilated.  4. Moderate pleural effusion.  5. The mitral valve is normal in structure. Moderate mitral valve regurgitation.  6. The aortic valve is normal in structure. Aortic valve regurgitation is not visualized. Mild aortic valve sclerosis is present, with no evidence of aortic valve stenosis.  7. The inferior vena cava is dilated in size with >50% respiratory variability, suggesting right atrial pressure of 8 mmHg. FINDINGS  Left Ventricle: Left ventricular ejection fraction, by estimation, is 35 to 40%. The left ventricle has moderately decreased function. The left ventricle demonstrates global hypokinesis. The left ventricular internal cavity size was mildly dilated. There is mild left ventricular hypertrophy. Left ventricular diastolic parameters are consistent with Grade II diastolic dysfunction (pseudonormalization). Right Ventricle: The right ventricular size is normal. No increase in right ventricular wall thickness. Right ventricular systolic function is normal. There is moderately elevated pulmonary artery systolic pressure. The tricuspid regurgitant velocity is 3.28 m/s, and with an assumed right atrial pressure of 8 mmHg, the estimated right ventricular systolic pressure is 25.0 mmHg. Left Atrium: Left atrial size was moderately dilated. Right Atrium: Right atrial size was normal in size. Pericardium: There is no evidence of pericardial effusion. Mitral Valve: The mitral valve is normal in structure. There is mild thickening of the mitral valve leaflet(s). Moderate mitral valve regurgitation. MV peak gradient, 5.3 mmHg. The mean mitral valve gradient is 2.0 mmHg. Tricuspid Valve: The tricuspid valve is normal in structure. Tricuspid valve regurgitation is mild. Aortic Valve: The aortic valve is normal in structure. Aortic valve regurgitation is not  visualized. Mild aortic valve sclerosis is present, with no evidence of aortic valve stenosis. There is mild calcification of the aortic valve. Aortic valve mean gradient measures 4.0 mmHg. Aortic valve peak gradient measures 6.7 mmHg. Aortic valve area, by VTI measures 2.68 cm. Pulmonic Valve: The pulmonic valve was not well visualized. Pulmonic valve regurgitation is not visualized. Aorta: The aortic root and ascending aorta are structurally normal, with no evidence of dilitation. Venous: The inferior vena cava is dilated in size with greater than 50% respiratory variability, suggesting right atrial pressure of 8 mmHg. IAS/Shunts: No atrial level shunt detected by color flow Doppler. Additional Comments: A pacer wire is visualized. There is a moderate pleural effusion.  LEFT VENTRICLE PLAX 2D LVIDd:         5.80 cm  Diastology LVIDs:         3.90 cm  LV e' lateral:   4.54 cm/s LV PW:         1.20 cm  LV E/e' lateral: 25.6 LV IVS:        1.40 cm  LV e' medial:    4.87 cm/s LVOT diam:     2.30 cm  LV E/e' medial:  23.8 LV SV:         76 LV SV Index:   39 LVOT Area:     4.15 cm                          3D Volume EF:                         3D EF:        44 %                         LV EDV:       202  ml                         LV ESV:       113 ml                         LV SV:        89 ml RIGHT VENTRICLE             IVC RV Basal diam:  3.70 cm     IVC diam: 2.30 cm RV Mid diam:    2.70 cm RV S prime:     10.50 cm/s TAPSE (M-mode): 2.3 cm LEFT ATRIUM             Index       RIGHT ATRIUM           Index LA diam:        3.80 cm 1.93 cm/m  RA Area:     19.90 cm LA Vol (A2C):   81.0 ml 41.15 ml/m RA Volume:   59.60 ml  30.28 ml/m LA Vol (A4C):   60.4 ml 30.69 ml/m LA Biplane Vol: 70.3 ml 35.72 ml/m  AORTIC VALVE AV Area (Vmax):    2.84 cm AV Area (Vmean):   2.34 cm AV Area (VTI):     2.68 cm AV Vmax:           129.00 cm/s AV Vmean:          88.900 cm/s AV VTI:            0.284 m AV Peak Grad:      6.7 mmHg AV  Mean Grad:      4.0 mmHg LVOT Vmax:         88.30 cm/s LVOT Vmean:        50.000 cm/s LVOT VTI:          0.183 m LVOT/AV VTI ratio: 0.64  AORTA Ao Root diam: 3.30 cm Ao Asc diam:  3.40 cm MITRAL VALVE                 TRICUSPID VALVE MV Area (PHT): 4.80 cm      TR Peak grad:   43.0 mmHg MV Peak grad:  5.3 mmHg      TR Vmax:        328.00 cm/s MV Mean grad:  2.0 mmHg MV Vmax:       1.15 m/s      SHUNTS MV Vmean:      58.7 cm/s     Systemic VTI:  0.18 m MV Decel Time: 158 msec      Systemic Diam: 2.30 cm MR Peak grad:    143.5 mmHg MR Mean grad:    89.0 mmHg MR Vmax:         599.00 cm/s MR Vmean:        442.0 cm/s MR PISA:         1.57 cm MR PISA Eff ROA: 10 mm MR PISA Radius:  0.50 cm MV E velocity: 116.00 cm/s MV A velocity: 87.80 cm/s MV E/A ratio:  1.32 Glori Bickers MD Electronically signed by Glori Bickers MD Signature Date/Time: 12/24/2019/5:04:22 PM    Final     Cardiac Studies   Echo 12/24/19 1. Left ventricular ejection fraction, by estimation, is 35 to 40%. The  left ventricle has moderately decreased function. The left ventricle  demonstrates global hypokinesis. The left ventricular  internal cavity size  was mildly dilated. There is mild  left ventricular hypertrophy. Left ventricular diastolic parameters are  consistent with Grade II diastolic dysfunction (pseudonormalization).  2. Right ventricular systolic function is normal. The right ventricular  size is normal. There is moderately elevated pulmonary artery systolic  pressure.  3. Left atrial size was moderately dilated.  4. Moderate pleural effusion.  5. The mitral valve is normal in structure. Moderate mitral valve  regurgitation.  6. The aortic valve is normal in structure. Aortic valve regurgitation is  not visualized. Mild aortic valve sclerosis is present, with no evidence  of aortic valve stenosis.  7. The inferior vena cava is dilated in size with >50% respiratory  variability, suggesting right atrial  pressure of 8 mmHg.   Patient Profile     69 y.o. male a hx of CAD, ICM, HFrEF, HTN, HLD, nephrotic syndrome, CKD stage IV, anemia of chronic disease, BPH with chronic indwelling foley catheter, and hx of stroke in 2017, who is being seen for the evaluation of CHF at the request of Dr Long  Assessment & Plan    Acute on chronic combined systolic and diastolic CHF: -His EF is only mildly decreased and he only has grade 1 diastolic dysfunction -Much of the volume overload on presentation is likely exacerbated by poor renal function (Cr 4.98) and low albumin (2.4). -appreciate nephrology involvement for stage 5 chronic kidney disease. Recommended to restart his home lasix  Hypertension: labile -agree with amlodipine 10 mg. Continue carvedilol, imdur as ordered.  Elevated troponin: -Feel this is demand ischemia in the setting of CHF exacerbation and poor renal function -asymptomatic -Continue aspirin, beta-blocker, high-dose statin, clopidogrel, imdur  Otherwise, per primary team  [facility] will sign off.   Medication Recommendations:  As above Other recommendations (labs, testing, etc):  none Follow up as an outpatient:  Has appt with Dr. Irish Lack on 01/14/20  For questions or updates, please contact [facility] Please consult [email or web address] for contact info under    Signed, Buford Dresser, MD  12/25/2019, 4:48 PM

## 2019-12-25 NOTE — Progress Notes (Signed)
Date: 12/25/2019  Patient name: Zacharie Portner  Medical record number: [id number]  Date of birth: [birth year]   I have seen and evaluated Dorita Fray and discussed their care with the Residency Team.  In brief, patient is a 69 year old male with a past medical history of hypertension, hyperlipidemia, CKD stage IV, chronic combined systolic heart failure (EF 40 to 35%, grade 1 diastolic dysfunction), nephrotic syndrome, anemia of chronic disease who presented to the ED with worsening shortness of breath x1 day.  Patient states that he had acute onset of shortness of breath on the night prior to his admission and also had associated diaphoresis.  Patient also had associated lower extremity swelling.  In the ED he was noted to be hypertensive with SBP's in the 200s and was admitted for further evaluation.  Patient states that he was feeling well prior to his acute onset of shortness of breath.  Patient also complained of some associated orthopnea and PND.  He initially required BiPAP in the ED but feels much better today.  Patient feels like his shortness of breath is resolved and he does not require any oxygen currently.  PMHx, Fam Hx, and/or Soc Hx : As per resident admit note  Vitals:   12/25/19 0728 12/25/19 1116  BP: (!) 168/85 (!) 142/71  Pulse: 72 65  Resp: 18 16  Temp: 100.1 F (37.8 C) 98.8 F (37.1 C)  SpO2: 95% 98%   General: Awake, alert, oriented x3, NAD CVS: Regular rate and rhythm, normal heart sounds Lungs: CTA bilaterally Abdomen: Soft, nontender, nondistended, normoactive bowel sounds Extremities: Trace left lower extremity edema noted, no edema noted in the right upper extremity, nontender to palpation Psych: Normal mood and affect HEENT: Normocephalic, atraumatic Skin: Warm and dry  Assessment and Plan: I have seen and evaluated the patient as outlined above. I agree with the formulated Assessment and Plan as detailed in the residents' note, with the following changes:    1.  Hypertensive emergency with resultant non-ST elevation MI: -Patient presented to the ED with acute onset of shortness of breath and was found to have SBP's in the 200s with pulmonary congestion and lower extremity edema as well as AKI on CKD and elevated troponins likely secondary to non-ST elevation MI possibly from demand ischemia. -Cardiology follow-up and recommendations appreciated -Patient with known 80% LAD lesion and is elevated troponins are likely secondary to demand ischemia.  Repeat echo done here showed an EF of 35 to 40% and grade 2 diastolic dysfunction. -Patient was scheduled for PCI in the past but this was deferred due to lack of angina and need for complex PCI.  Cardiology did discuss case with interventional cardiology to discuss if further intervention is warranted -We will continue with aspirin, Plavix, beta-blocker and high intensity statin as well as Imdur -We will continue with heparin drip per cardiology. -Patient blood pressure is much improved today with SBP's in the 140s to 160s.  He was started on amlodipine, Imdur, carvedilol and Lasix on admission -Nephro follow-up and recommendations appreciated.  Will increase amlodipine to 10 mg and add hydralazine to his blood pressure medication.  We will continue to monitor closely  2.  Acute on chronic diastolic heart failure exacerbation: -Patient presented to the ED with worsening shortness of breath, lower extremity edema and pulmonary vascular congestion noted on chest x-ray consistent with an acute on chronic diastolic heart failure exacerbation.  I suspect this is secondary to his uncontrolled hypertension -Patient received Lasix 80 mg IV twice  daily yesterday and was net negative approximately 1 L. -Today, patient was noted to have only trace left lower extremity edema and his lungs were clear to auscultation on exam.  Patient also comfortable breathing and has O2 sats in the 90s on room air. -Patient will receive  an additional Lasix 120 mg IV this morning -We will resume home Lasix 80 mg twice daily orally beginning this evening -Continue strict I's and O's and daily weights -No further work-up at this time  3.  AKI on CKD: -Patient was noted to have worsening creatinine on admission.  Patient baseline creatinine ranges from 3.8-4.3 and was noted to have a creatinine of 4.74 on admission. -Patient's creatinine was up to 4.98 today. -Nephrology follow-up recommendation appreciated.  No indication for urgent hemodialysis at this time. -We will continue to monitor BMP and urine output closely  4.  Leukocytosis/possible UTI: -Patient did have an admission in April/May for Pseudomonas bacteremia and UTI -He was noted to have leukocytosis on admission here with WBC up to 17.6 today.  His UA was positive for moderate hemoglobin, small leukocytes and few bacteria. -Urine culture was positive for Pseudomonas.  His previous urine culture showed Pseudomonas sensitive to ciprofloxacin (he also had cephalosporin toxicity when treated with that) -Would consider starting the patient on empiric ciprofloxacin for now.  Aldine Contes, MD 6/18/202111:42 AM

## 2019-12-25 NOTE — Progress Notes (Signed)
HD#1 Subjective:  Overnight Events: no events overnight.   Dennis Macias was seen at bedside this morning. He states that he is doing better than yesterday. His breathing has improved, and he was able to get off room air. He endorses urination today. We also discussed his poor kidney function and that nephrology will be seeing the patient today. We also discussed cardiology evaluating the patient as well due to his hypertensive urgency/emergency. Patient voiced understanding. All questions and concerns were addressed.   Objective:  Vital signs in last 24 hours: Vitals:   12/24/19 1930 12/24/19 1957 12/25/19 0100 12/25/19 0555  BP: (!) 164/85 (!) 185/86 (!) 167/75 (!) 155/81  Pulse: 73 74 73 75  Resp: 20 19 19 19   Temp:  99.3 F (37.4 C) 99.1 F (37.3 C) 99.5 F (37.5 C)  TempSrc:  Oral Oral Oral  SpO2: 97% 94% 96% 96%  Weight:      Height:       Supplemental O2: RA SpO2: 96 % FiO2 (%): 30 %   Physical Exam:  Physical Exam Constitutional:      Appearance: Normal appearance.  HENT:     Head: Normocephalic and atraumatic.  Eyes:     Extraocular Movements: Extraocular movements intact.  Cardiovascular:     Rate and Rhythm: Normal rate.     Pulses: Normal pulses.     Heart sounds: Normal heart sounds.  Pulmonary:     Effort: Pulmonary effort is normal.     Breath sounds: Normal breath sounds.  Abdominal:     General: Bowel sounds are normal.     Palpations: Abdomen is soft.     Tenderness: There is no abdominal tenderness.  Musculoskeletal:        General: Normal range of motion.     Cervical back: Normal range of motion.     Right lower leg: No edema.     Left lower leg: No edema.  Skin:    General: Skin is warm and dry.  Neurological:     Mental Status: He is alert and oriented to person, place, and time. Mental status is at baseline.  Psychiatric:        Mood and Affect: Mood normal.     Filed Weights   12/24/19 0825  Weight: 75.3 kg      Intake/Output Summary (Last 24 hours) at 12/25/2019 0600 Last data filed at 12/25/2019 0300 Gross per 24 hour  Intake 384.58 ml  Output 975 ml  Net -590.42 ml   Net IO Since Admission: -590.42 mL [12/25/19 0600]  Pertinent Labs: CBC Latest Ref Rng & Units 12/24/2019 12/23/2019 12/10/2019  WBC 4.0 - 10.5 K/uL 13.8(H) - 5.5  Hemoglobin 13.0 - 17.0 g/dL 9.3(L) 9.0(L) 9.6(L)  Hematocrit 39 - 52 % 29.7(L) - 28.4(L)  Platelets 150 - 400 K/uL 247 - 285    CMP Latest Ref Rng & Units 12/24/2019 12/10/2019 12/04/2019  Glucose 70 - 99 mg/dL 174(H) 243(H) 112(H)  BUN 8 - 23 mg/dL 74(H) 80(HH) 105(H)  Creatinine 0.61 - 1.24 mg/dL 4.74(H) 3.84(H) 4.31(H)  Sodium 135 - 145 mmol/L 140 137 135  Potassium 3.5 - 5.1 mmol/L 5.2(H) 5.1 4.4  Chloride 98 - 111 mmol/L 108 100 101  CO2 22 - 32 mmol/L 18(L) 22 26  Calcium 8.9 - 10.3 mg/dL 9.1 8.6 8.3(L)  Total Protein 6.5 - 8.1 g/dL 6.4(L) - -  Total Bilirubin 0.3 - 1.2 mg/dL 0.6 - -  Alkaline Phos 38 - 126 U/L  57 - -  AST 15 - 41 U/L 16 - -  ALT 0 - 44 U/L 12 - -    Pending Labs: None  Imaging:  CXR: FINDINGS: Cardiac shadow is enlarged but stable. Aortic calcifications are again seen. Vascular congestion with mild interstitial edema and small effusions is seen. This is slightly worse than that noted on the prior exam. No bony abnormality is noted. IMPRESSION: Changes consistent with CHF  Echocardiogram: 1. Left ventricular ejection fraction, by estimation, is 35 to 40%. The  left ventricle has moderately decreased function. The left ventricle  demonstrates global hypokinesis. The left ventricular internal cavity size  was mildly dilated. There is mild  left ventricular hypertrophy. Left ventricular diastolic parameters are  consistent with Grade II diastolic dysfunction (pseudonormalization).  2. Right ventricular systolic function is normal. The right ventricular  size is normal. There is moderately elevated pulmonary artery systolic   pressure.  3. Left atrial size was moderately dilated.  4. Moderate pleural effusion.  5. The mitral valve is normal in structure. Moderate mitral valve  regurgitation.  6. The aortic valve is normal in structure. Aortic valve regurgitation is  not visualized. Mild aortic valve sclerosis is present, with no evidence  of aortic valve stenosis.  7. The inferior vena cava is dilated in size with >50% respiratory  variability, suggesting right atrial pressure of 8 mmHg.  Previous Cardiac Cath: (11/19/2019)  Mid LAD lesion is 75% stenosed.  Ost LAD to Prox LAD lesion is 80% stenosed. In the cranial views, it is seen that the disease extends up to the ostial LAD, and there is significant calcification.  LV end diastolic pressure is normal.  There is no aortic valve stenosis.   PCI of the proximal - ostial LAD would likely require IVUS with possible atherectomy.  There is a mid to distal LAD lesion which is eccentric.    Given his lack of angina and issues with renal insufficiency, would not pursue PCI at this time.  Would reconsider based on his renal function.      Assessment/Plan:   Principal Problem:   Acute on chronic combined systolic and diastolic CHF (congestive heart failure) (HCC) Active Problems:   Diabetes mellitus with retinopathy of both eyes (HCC)   Essential hypertension   CKD (chronic kidney disease) stage 4, GFR 15-29 ml/min (HCC)   Non-STEMI (non-ST elevated myocardial infarction) (HCC)   Shortness of breath   Patient Summary: Dennis Macias is a 69 y.o. male with a pertinent PMH of HTN, HLD, CKD (Stage IV), mixed CHF (EF 40-45%, GD1), CVC (11/2019), Nephrotic syndrome, Anemia of chronic disease, who presented to [facility] ED with acute onset shortness of breath and admitted for further evaluation and management.    #Tropinemia 2/2 to Type II NSTEMI: #HTN emergency vs urgency: Patient presented with an elevated troponin of 481 and repeat of 1,921 and started on  Heparin. Likely secondary to Type II NSTEMI in the setting of SBP > 200..  - Continue heparin drip for 48 hours - Continue DAPT - Continue  Imdur 60 mg, carvedilol 25 mg, and amlodipine 5 mg and prn labetalol to keep SBP < 160 - Appreciate cardiology's recommendation.   #Acute on chronic Heart failure #HFrEF (EF 35-40%) Patient has a new worsened EF of 35-45% in the setting of type II NSTEMI. Patient still does not have nay chest pain and his [facility] has improved.  - Continue strict I&O, daily weights - Hold off on diuretics and defer to nephrology. -  Continue carvedilol and Imdur. - Appreciate cardiology and nephrologies assistance.     #Acute on CKD (Stage IV-V): #Nephrotic syndrome: Patients kidney function continues to decline with a GFR of 13. He was give IV furosemide 80 mg twice yesterday with minimal output. Renal US does not show nay signs of obstruction. Nephrology consulted for further evaluation and management.  - Appreciate nephrologies assistance.   #DM: - continue SSI.  HLD: Hx of left ICA ischemic stroke: Patient takes atorvastatin 80 mg daily, ASA 81 mg and Plavix 75 mg.  - Continue Atorvastatin 80 mg - Continue DAPT with  ASA 81 mg and Plavix 75 mg  Diet: Carb/Renal IVF: None,None VTE: Heparin Code: Full PT/OT recs: Pending TOC recs: pending   Dispo: Anticipated discharge pending further evaluation and management. Marland Kitchen    Marianna Payment, D.O. MCIM[facility], PGY-1 Date 12/25/2019 Time 6:00 AM Pager: [phone] Please contact the on call pager after 5 pm and on weekends at [phone].

## 2019-12-25 NOTE — Progress Notes (Signed)
ANTICOAGULATION CONSULT NOTE - Follow Up Consult  Pharmacy Consult for Heparin Indication: chest pain/ACS  Allergies  Allergen Reactions  . Cefepime Other (See Comments)    Pt had BAD encephalopathy from Cefepime    Patient Measurements: Height: 6' (182.9 cm) Weight: 75.3 kg (166 lb) IBW/kg (Calculated) : 77.6  Heparin Dosing Weight: 75.3 kg  Vital Signs: Temp: 99.5 F (37.5 C) (06/18 0555) Temp Source: Oral (06/18 0555) BP: 155/81 (06/18 0555) Pulse Rate: 75 (06/18 0555)  Labs: Recent Labs    12/24/19 0852 12/24/19 1133 12/24/19 2021 12/25/19 0519  HGB 9.3*  --   --  9.1*  HCT 29.7*  --   --  28.2*  PLT 247  --   --  243  HEPARINUNFRC  --   --  0.21* 0.15*  CREATININE 4.74*  --   --   --   TROPONINIHS 481* 1,921*  --   --     Estimated Creatinine Clearance: 15.9 mL/min (A) (by C-G formula based on SCr of 4.74 mg/dL (H)).   Medical History: Past Medical History:  Diagnosis Date  . Anemia   . Arthritis    past hx   . Blindness    right eye  . Cardiorenal syndrome   . Cataract    removed both eyes  . Dehydration   . Diabetes ([place])   . Glaucoma   . History of CVA (cerebrovascular accident) 09/13/2015  . History of stroke 09/13/2015  . History of urinary retention   . Hyperlipidemia   . Hypertension   . NSTEMI (non-ST elevated myocardial infarction) ([place])   . Pernicious anemia 02/24/2018  . S/P TURP   . Stroke [facility])    2017- March  . Syncope 11/2019  . Tachycardia 08/26/2017  . Tubular adenoma of colon 02/2017  . Weight loss, non-intentional 08/26/2017   10 lbs between 6/18 & 2/19    Assessment: 69 y.o. male with past medical history presented with shortness of breath, diaphoresis, leg swelling and elevated troponin; he was diagnosed with acute exacerbation of CHF and NSTEMI (felt by Cardiology to be demand ischemia in setting of CHF exacerbation and poor renal function). Pharmacy was consulted to dose/monitor heparin for ACS. Pt takes Plavix at home,  but no other anticoagulants PTA.  Medical hx includes HTN, HLD, CKD IV, DM, mixed CHF, CVA (2017, 11/2019), nephrotic syndrome, anemia of chronic disease.  Initial heparin level ~8.5 hrs after heparin 4000 units IV bolus, followed by heparin at 900 units/hr, was 0.21 units/ml, which is below the goal range for this pt. CBC WNL, Per RN, no issues with IV or bleeding observed.  6/18 AM update: Heparin level low Hgb on the lower side but stable  Goal of Therapy:  Heparin level 0.3-0.7 units/ml Monitor platelets by anticoagulation protocol: Yes   Plan:  Increase heparin infusion to 1200 units/hr Check 8-hr heparin level Monitor daily heparin level, CBC Monitor for signs/symptoms of bleeding  Narda Bonds, PharmD, BCPS Clinical Pharmacist Phone: [phone]

## 2019-12-25 NOTE — Progress Notes (Signed)
ANTICOAGULATION CONSULT NOTE - Follow Up Consult  Pharmacy Consult for Heparin Indication: chest pain/ACS  Allergies  Allergen Reactions  . Cefepime Other (See Comments)    Pt had BAD encephalopathy from Cefepime    Patient Measurements: Height: 6' (182.9 cm) Weight: 71.7 kg (158 lb) IBW/kg (Calculated) : 77.6  Heparin Dosing Weight: 75.3 kg  Vital Signs: Temp: 98.8 F (37.1 C) (06/18 1116) Temp Source: Oral (06/18 1116) BP: 142/71 (06/18 1116) Pulse Rate: 65 (06/18 1116)  Labs: Recent Labs    12/24/19 0852 12/24/19 1133 12/24/19 2021 12/25/19 0519 12/25/19 1327  HGB 9.3*  --   --  9.1*  --   HCT 29.7*  --   --  28.2*  --   PLT 247  --   --  243  --   HEPARINUNFRC  --   --  0.21* 0.15* 0.27*  CREATININE 4.74*  --   --  4.98*  --   TROPONINIHS 481* 1,921*  --   --   --     Estimated Creatinine Clearance: 14.4 mL/min (A) (by C-G formula based on SCr of 4.98 mg/dL (H)).   Medical History: Past Medical History:  Diagnosis Date  . Anemia   . Arthritis    past hx   . Blindness    right eye  . Cardiorenal syndrome   . Cataract    removed both eyes  . Dehydration   . Diabetes ([place])   . Glaucoma   . History of CVA (cerebrovascular accident) 09/13/2015  . History of stroke 09/13/2015  . History of urinary retention   . Hyperlipidemia   . Hypertension   . NSTEMI (non-ST elevated myocardial infarction) ([place])   . Pernicious anemia 02/24/2018  . S/P TURP   . Stroke [facility])    2017- March  . Syncope 11/2019  . Tachycardia 08/26/2017  . Tubular adenoma of colon 02/2017  . Weight loss, non-intentional 08/26/2017   10 lbs between 6/18 & 2/19    Assessment: 69 y.o. male with past medical history presented with shortness of breath, diaphoresis, leg swelling and elevated troponin; he was diagnosed with acute exacerbation of CHF and NSTEMI (felt by Cardiology to be demand ischemia in setting of CHF exacerbation and poor renal function). Pharmacy was consulted to  dose/monitor heparin for ACS. Pt takes Plavix at home, but no other anticoagulants PTA.  Medical hx includes HTN, HLD, CKD IV, DM, mixed CHF, CVA (2017, 11/2019), nephrotic syndrome, anemia of chronic disease.  Heparin level today came back slightly subtherapeutic at 0.27, on 1200 units/hr. Hgb 9.1, plt 243. No s/sx of bleeding or infusion issues.  Goal of Therapy:  Heparin level 0.3-0.7 units/ml Monitor platelets by anticoagulation protocol: Yes   Plan:  Increase heparin infusion to 1300 units/hr Check 8-hr heparin level Monitor daily heparin level, CBC Monitor for signs/symptoms of bleeding  Antonietta Jewel, PharmD, [place] Pharmacist  Phone: [phone] 12/25/2019 2:40 PM  Please check AMION for all [place] phone numbers After 10:00 PM, call [place] [phone]

## 2019-12-25 NOTE — Consult Note (Signed)
   [facility] CM Inpatient Consult   12/25/2019  Dennis Macias 04-08-1951 [id number]   Patient was referred for [place] Management services for Remote Health per Inpatient Transition of Care RNCM.  Marland Kitchen  Patient will have the transition of care call conducted by the primary care provider's office at [facility], Dr. [name].   This patient is also in an Embedded practice which has a chronic disease management Embedded Care Management team had offered services but were declined earlier this month. However, wife did call Embedded social worker for assistance for personal care service needs and assistance noted per Embedded social worker's notes.  Plan: Notification sent to the [place] [facility] and make aware of admission and referral for Remote Health per inpatient [facility] RNCM.  Please contact for further questions,  Natividad Brood, RN BSN [facility] Liaison  [phone] business mobile phone Toll free office [phone]  Fax number: [phone] [email or web address]

## 2019-12-25 NOTE — Consult Note (Signed)
[facility] Renal Consultation Note  Requesting MD:  Indication for Consultation: Chronic renal insufficiency stage V not currently on dialysis.  Management to euvolemia, assessment and treatment of anemia, management of bone mineral metabolism disorders.  HPI:  Dennis Macias is a 69 y.o. male.  With a history of chronic kidney disease baseline serum creatinine 3.5 mg/dL.  History of CVA 2017 history of diabetes with nephrotic range proteinuria, history of urinary retention requiring urinary catheterization.  History of hyperlipidemia.  History of congestive heart failure ejection fraction of 35%.     Admission 11/25/2019-12/04/2019 debility and encephalopathy. Admission 11/06/2019-11/25/2019  Status post cardiac catheterization 11/19/2019 75 stenosis mid LAD 80% stenosis ostial LAD to proximal LAD.  Recommended medical therapy.  Complicated by Pseudomonas bacteremia and bacteriuria.  History of cefepime neurotoxicity.  Acute CVA in the parietal region.  None ST elevated MI with decompensated congestive heart failure.  Physical deconditioning  Admission 10/14/2019 to 10/22/2019 with decompensated congestive heart failure.   Presented with acute shortness of breath requiring oxygen therapy.  Also with hypertensive urgency.  Blood pressure 165/85 pulse 72 temperature 100.1 O2 sats 95% room air Urine output 675 cc 12/24/2019  Sodium 142 potassium 4.8 chloride 111 CO2 19 BUN 79 creatinine 4.98 glucose 99 calcium 9 albumin 2.4 liver enzymes within normal range, WBC 17.6 hemoglobin 9.1  Renal ultrasound shows no hydronephrosis bladder wall thickening and pleural effusions  Chest x-ray was consistent with congestive heart failure  Amlodipine 5 mg daily, aspirin 81 mg daily Lipitor 80 mg daily carvedilol 25 mg twice daily Plavix 75 mg daily Proscar 5 mg daily, Flomax 0.4 mg daily  IV heparin   Creatinine, Ser  Date/Time Value Ref Range Status  12/25/2019 05:19 AM 4.98 (H) 0.61 - 1.24 mg/dL  Final  12/24/2019 08:52 AM 4.74 (H) 0.61 - 1.24 mg/dL Final  12/10/2019 02:39 PM 3.84 (H) 0.76 - 1.27 mg/dL Final  12/04/2019 06:22 AM 4.31 (H) 0.61 - 1.24 mg/dL Final  12/03/2019 06:19 AM 4.37 (H) 0.61 - 1.24 mg/dL Final  12/02/2019 05:33 AM 4.33 (H) 0.61 - 1.24 mg/dL Final  12/01/2019 07:07 AM 4.35 (H) 0.61 - 1.24 mg/dL Final  11/30/2019 09:32 AM 4.38 (H) 0.61 - 1.24 mg/dL Final  11/29/2019 02:36 AM 4.50 (H) 0.61 - 1.24 mg/dL Final  11/28/2019 07:58 AM 4.39 (H) 0.61 - 1.24 mg/dL Final  11/27/2019 07:00 AM 4.27 (H) 0.61 - 1.24 mg/dL Final  11/26/2019 09:46 AM 4.16 (H) 0.61 - 1.24 mg/dL Final  11/25/2019 04:04 AM 4.09 (H) 0.61 - 1.24 mg/dL Final  11/24/2019 03:58 AM 4.07 (H) 0.61 - 1.24 mg/dL Final  11/23/2019 03:19 AM 3.99 (H) 0.61 - 1.24 mg/dL Final  11/22/2019 03:56 AM 4.10 (H) 0.61 - 1.24 mg/dL Final  11/21/2019 04:06 AM 3.99 (H) 0.61 - 1.24 mg/dL Final  11/20/2019 05:17 AM 4.01 (H) 0.61 - 1.24 mg/dL Final  11/19/2019 08:17 AM 3.88 (H) 0.61 - 1.24 mg/dL Final  11/18/2019 05:01 AM 3.88 (H) 0.61 - 1.24 mg/dL Final  11/17/2019 02:48 AM 3.56 (H) 0.61 - 1.24 mg/dL Final  11/16/2019 07:10 AM 3.52 (H) 0.61 - 1.24 mg/dL Final  11/15/2019 03:36 AM 3.34 (H) 0.61 - 1.24 mg/dL Final  11/14/2019 07:48 AM 3.42 (H) 0.61 - 1.24 mg/dL Final  11/13/2019 04:19 AM 3.65 (H) 0.61 - 1.24 mg/dL Final  11/12/2019 10:39 AM 3.94 (H) 0.61 - 1.24 mg/dL Final  11/12/2019 03:54 AM 3.90 (H) 0.61 - 1.24 mg/dL Final  11/11/2019 02:44 PM 3.88 (H) 0.61 - 1.24  mg/dL Final  11/11/2019 05:08 AM 4.03 (H) 0.61 - 1.24 mg/dL Final  11/10/2019 03:49 AM 4.18 (H) 0.61 - 1.24 mg/dL Final  11/09/2019 04:03 AM 4.34 (H) 0.61 - 1.24 mg/dL Final  11/08/2019 04:45 AM 4.13 (H) 0.61 - 1.24 mg/dL Final  11/07/2019 03:18 AM 3.94 (H) 0.61 - 1.24 mg/dL Final  11/06/2019 09:35 AM 3.84 (H) 0.61 - 1.24 mg/dL Final  10/22/2019 05:05 AM 3.55 (H) 0.61 - 1.24 mg/dL Final  10/21/2019 04:54 AM 3.62 (H) 0.61 - 1.24 mg/dL Final  10/20/2019 05:03  AM 3.45 (H) 0.61 - 1.24 mg/dL Final  10/19/2019 05:53 AM 3.52 (H) 0.61 - 1.24 mg/dL Final  10/18/2019 04:42 PM 3.49 (H) 0.61 - 1.24 mg/dL Final  10/18/2019 04:54 AM 3.52 (H) 0.61 - 1.24 mg/dL Final  10/17/2019 04:47 PM 3.56 (H) 0.61 - 1.24 mg/dL Final  10/17/2019 06:03 AM 3.66 (H) 0.61 - 1.24 mg/dL Final  10/16/2019 05:13 PM 3.44 (H) 0.61 - 1.24 mg/dL Final  10/16/2019 01:07 AM 3.35 (H) 0.61 - 1.24 mg/dL Final  10/14/2019 10:59 PM 3.27 (H) 0.61 - 1.24 mg/dL Final  10/13/2019 02:48 PM 3.26 (H) 0.61 - 1.24 mg/dL Final  09/21/2019 05:02 AM 3.54 (H) 0.61 - 1.24 mg/dL Final  09/20/2019 01:54 AM 3.67 (H) 0.61 - 1.24 mg/dL Final  09/19/2019 04:18 AM 3.73 (H) 0.61 - 1.24 mg/dL Final  09/18/2019 06:10 AM 3.62 (H) 0.61 - 1.24 mg/dL Final  09/17/2019 06:04 AM 3.58 (H) 0.61 - 1.24 mg/dL Final  09/16/2019 03:55 PM 3.66 (H) 0.61 - 1.24 mg/dL Final     PMHx:   Past Medical History:  Diagnosis Date  . Anemia   . Arthritis    past hx   . Blindness    right eye  . Cardiorenal syndrome   . Cataract    removed both eyes  . Dehydration   . Diabetes ([place])   . Glaucoma   . History of CVA (cerebrovascular accident) 09/13/2015  . History of stroke 09/13/2015  . History of urinary retention   . Hyperlipidemia   . Hypertension   . NSTEMI (non-ST elevated myocardial infarction) ([place])   . Pernicious anemia 02/24/2018  . S/P TURP   . Stroke [facility])    2017- March  . Syncope 11/2019  . Tachycardia 08/26/2017  . Tubular adenoma of colon 02/2017  . Weight loss, non-intentional 08/26/2017   10 lbs between 6/18 & 2/19    Past Surgical History:  Procedure Laterality Date  . CATARACT EXTRACTION, BILATERAL    . COLONOSCOPY    . IR THORACENTESIS ASP PLEURAL SPACE W/IMG GUIDE  09/21/2019  . IR THORACENTESIS ASP PLEURAL SPACE W/IMG GUIDE  10/16/2019  . LEFT HEART CATH AND CORONARY ANGIOGRAPHY N/A 11/19/2019   Procedure: LEFT HEART CATH AND CORONARY ANGIOGRAPHY;  Surgeon: Jettie Booze, MD;  Location: [facility];  Service: Cardiovascular;  Laterality: N/A;  . POLYPECTOMY    . REFRACTIVE SURGERY  10/2017  . TEE WITHOUT CARDIOVERSION N/A 09/14/2015   Procedure: TRANSESOPHAGEAL ECHOCARDIOGRAM (TEE);  Surgeon: Larey Dresser, MD;  Location: [facility];  Service: Cardiovascular;  Laterality: N/A;  . TRANSURETHRAL RESECTION OF PROSTATE N/A 10/20/2019   Procedure: TRANSURETHRAL RESECTION OF THE PROSTATE (TURP);  Surgeon: Irine Seal, MD;  Location: [facility];  Service: Urology;  Laterality: N/A;  . UPPER GASTROINTESTINAL ENDOSCOPY      Family Hx:  Family History  Problem Relation Age of Onset  . Hypertension Mother   . Hyperlipidemia Mother   .  Hyperlipidemia Father   . Colon cancer Neg Hx   . Colon polyps Neg Hx   . Esophageal cancer Neg Hx   . Rectal cancer Neg Hx   . Stomach cancer Neg Hx     Social History:  reports that he has quit smoking. He quit smokeless tobacco use about 41 years ago.  His smokeless tobacco use included chew. He reports that he does not drink alcohol and does not use drugs.  Allergies:  Allergies  Allergen Reactions  . Cefepime Other (See Comments)    Pt had BAD encephalopathy from Cefepime    Medications: Prior to Admission medications   Medication Sig Start Date End Date Taking? Authorizing Provider  acetaminophen (TYLENOL) 325 MG tablet Take 1-2 tablets (325-650 mg total) by mouth every 4 (four) hours as needed for mild pain. 12/03/19  Yes Angiulli, Lavon Paganini, PA-C  amLODipine (NORVASC) 5 MG tablet Take 1 tablet (5 mg total) by mouth daily. 12/03/19  Yes Angiulli, Lavon Paganini, PA-C  aspirin EC 81 MG tablet Take 1 tablet (81 mg total) by mouth daily. 10/22/19  Yes Eugenie Filler, MD  atorvastatin (LIPITOR) 80 MG tablet Take 1 tablet (80 mg total) by mouth at bedtime. IM program 12/03/19  Yes Angiulli, Lavon Paganini, PA-C  carvedilol (COREG) 25 MG tablet Take 1 tablet (25 mg total) by mouth 2 (two) times daily with a meal. 12/03/19  Yes Angiulli, Lavon Paganini, PA-C   clopidogrel (PLAVIX) 75 MG tablet Take 1 tablet (75 mg total) by mouth daily. 12/03/19  Yes Angiulli, Lavon Paganini, PA-C  dorzolamide-timolol (COSOPT) 22.3-6.8 MG/ML ophthalmic solution Place 1 drop into the right eye 2 (two) times daily. 12/03/19  Yes Angiulli, Lavon Paganini, PA-C  ferrous gluconate (FERGON) 324 MG tablet Take 1 tablet (324 mg total) by mouth daily with breakfast. 12/03/19  Yes Angiulli, Lavon Paganini, PA-C  finasteride (PROSCAR) 5 MG tablet Take 1 tablet (5 mg total) by mouth daily. 12/03/19  Yes Angiulli, Lavon Paganini, PA-C  furosemide (LASIX) 80 MG tablet Take 1 tablet (80 mg total) by mouth 2 (two) times daily. 12/15/19  Yes Lucious Groves, DO  isosorbide mononitrate (IMDUR) 60 MG 24 hr tablet Take 1 tablet (60 mg total) by mouth daily. 12/03/19 01/02/20 Yes Angiulli, Lavon Paganini, PA-C  melatonin 3 MG TABS tablet Take 1 tablet (3 mg total) by mouth at bedtime. 12/03/19  Yes Angiulli, Lavon Paganini, PA-C  tamsulosin (FLOMAX) 0.4 MG CAPS capsule Take 1 capsule (0.4 mg total) by mouth daily after supper. 12/03/19  Yes Angiulli, Lavon Paganini, PA-C  vitamin B-12 (CYANOCOBALAMIN) 1000 MCG tablet Take 1 tablet (1,000 mcg total) by mouth daily. 12/03/19  Yes Angiulli, Lavon Paganini, PA-C  Accu-Chek FastClix Lancets MISC Check blood sugar up to 7 times a week as instructed 12/03/19   Angiulli, Lavon Paganini, PA-C  Blood Glucose Monitoring Suppl (ACCU-CHEK GUIDE) w/Device KIT 1 each by Does not apply route daily. Check blood sugar as instructed up to 7 times a week 12/03/19   Angiulli, Lavon Paganini, PA-C  glucose blood (ACCU-CHEK GUIDE) test strip Check blood sugar up to 7 times a week as instructed 12/03/19   Angiulli, Lavon Paganini, PA-C     Labs:  Results for orders placed or performed during the hospital encounter of 12/24/19 (from the past 48 hour(s))  Comprehensive metabolic panel     Status: Abnormal   Collection Time: 12/24/19  8:52 AM  Result Value Ref Range   Sodium 140 135 - 145 mmol/L  Potassium 5.2 (H) 3.5 - 5.1 mmol/L    Chloride 108 98 - 111 mmol/L   CO2 18 (L) 22 - 32 mmol/L   Glucose, Bld 174 (H) 70 - 99 mg/dL    Comment: Glucose reference range applies only to samples taken after fasting for at least 8 hours.   BUN 74 (H) 8 - 23 mg/dL   Creatinine, Ser 4.74 (H) 0.61 - 1.24 mg/dL   Calcium 9.1 8.9 - 10.3 mg/dL   Total Protein 6.4 (L) 6.5 - 8.1 g/dL   Albumin 2.8 (L) 3.5 - 5.0 g/dL   AST 16 15 - 41 U/L   ALT 12 0 - 44 U/L   Alkaline Phosphatase 57 38 - 126 U/L   Total Bilirubin 0.6 0.3 - 1.2 mg/dL   GFR calc non Af Amer 12 (L) >60 mL/min   GFR calc Af Amer 14 (L) >60 mL/min   Anion gap 14 5 - 15    Comment: Performed at [facility] [address], [place], [place] [address]  Brain natriuretic peptide     Status: Abnormal   Collection Time: 12/24/19  8:52 AM  Result Value Ref Range   B Natriuretic Peptide 1,554.4 (H) 0.0 - 100.0 pg/mL    Comment: Performed at [facility] [address], [place], [place] [address]  Troponin I (High Sensitivity)     Status: Abnormal   Collection Time: 12/24/19  8:52 AM  Result Value Ref Range   Troponin I (High Sensitivity) 481 (HH) <18 ng/L    Comment: CRITICAL RESULT CALLED TO, READ BACK BY AND VERIFIED WITH: [facility] RN @ 3244 12/24/19 LEONARD,A (NOTE) Elevated high sensitivity troponin I (hsTnI) values and significant  changes across serial measurements may suggest ACS but many other  chronic and acute conditions are known to elevate hsTnI results.  Refer to the Links section for chest pain algorithms and additional  guidance. Performed at [facility], [place] [address] [place], [place] [address]   CBC with Differential     Status: Abnormal   Collection Time: 12/24/19  8:52 AM  Result Value Ref Range   WBC 13.8 (H) 4.0 - 10.5 K/uL   RBC 3.24 (L) 4.22 - 5.81 MIL/uL   Hemoglobin 9.3 (L) 13.0 - 17.0 g/dL   HCT 29.7 (L) 39 - 52 %   MCV 91.7 80.0 - 100.0 fL   MCH 28.7 26.0 - 34.0 pg   MCHC 31.3 30.0 - 36.0 g/dL   RDW 14.8 11.5 -  15.5 %   Platelets 247 150 - 400 K/uL   nRBC 0.0 0.0 - 0.2 %   Neutrophils Relative % 89 %   Neutro Abs 12.3 (H) 1.7 - 7.7 K/uL   Lymphocytes Relative 8 %   Lymphs Abs 1.1 0.7 - 4.0 K/uL   Monocytes Relative 3 %   Monocytes Absolute 0.4 0 - 1 K/uL   Eosinophils Relative 0 %   Eosinophils Absolute 0.0 0 - 0 K/uL   Basophils Relative 0 %   Basophils Absolute 0.0 0 - 0 K/uL   Immature Granulocytes 0 %   Abs Immature Granulocytes 0.06 0.00 - 0.07 K/uL    Comment: Performed at [facility] [place], [place] [address], [place],  [address]  SARS Coronavirus 2 by RT PCR (hospital order, performed in [facility] hospital lab) Nasopharyngeal Nasopharyngeal Swab     Status: None   Collection Time: 12/24/19  8:52 AM   Specimen: Nasopharyngeal Swab  Result Value Ref Range   SARS Coronavirus 2 NEGATIVE NEGATIVE    Comment: (NOTE) SARS-CoV-2 target nucleic acids are NOT DETECTED.  The SARS-CoV-2 RNA is generally detectable in upper and lower respiratory specimens during the acute phase of infection. The lowest concentration of SARS-CoV-2 viral copies this assay can detect is 250 copies / mL. A negative result does not preclude SARS-CoV-2 infection and should not be used as the sole basis for treatment or other patient management decisions.  A negative result may occur with improper specimen collection / handling, submission of specimen other than nasopharyngeal swab, presence of viral mutation(s) within the areas targeted by this assay, and inadequate number of viral copies (<250 copies / mL). A negative result must be combined with clinical observations, patient history, and epidemiological information.  Fact Sheet for Patients:   StrictlyIdeas.no  Fact Sheet for Healthcare Providers: BankingDealers.co.za  This test is not yet approved or  cleared by the [place] FDA and has been authorized for detection and/or diagnosis of SARS-CoV-2  by FDA under an Emergency Use Authorization (EUA).  This EUA will remain in effect (meaning this test can be used) for the duration of the COVID-19 declaration under Section 564(b)(1) of the Act, 21 U.S.C. section 360bbb-3(b)(1), unless the authorization is terminated or revoked sooner.  Performed at [facility], [place] [address], [place], [place] [address]   Troponin I (High Sensitivity)     Status: Abnormal   Collection Time: 12/24/19 11:33 AM  Result Value Ref Range   Troponin I (High Sensitivity) 1,921 (HH) <18 ng/L    Comment: CRITICAL VALUE NOTED.  VALUE IS CONSISTENT WITH PREVIOUSLY REPORTED AND CALLED VALUE. (NOTE) Elevated high sensitivity troponin I (hsTnI) values and significant  changes across serial measurements may suggest ACS but many other  chronic and acute conditions are known to elevate hsTnI results.  Refer to the Links section for chest pain algorithms and additional  guidance. Performed at [facility], [place] [address] [place], [place] [address]   CBG monitoring, ED     Status: Abnormal   Collection Time: 12/24/19  5:30 PM  Result Value Ref Range   Glucose-Capillary 120 (H) 70 - 99 mg/dL    Comment: Glucose reference range applies only to samples taken after fasting for at least 8 hours.  Urinalysis, Routine w reflex microscopic     Status: Abnormal   Collection Time: 12/24/19  5:37 PM  Result Value Ref Range   Color, Urine YELLOW YELLOW   APPearance CLEAR CLEAR   Specific Gravity, Urine 1.011 1.005 - 1.030   pH 6.0 5.0 - 8.0   Glucose, UA 50 (A) NEGATIVE mg/dL   Hgb urine dipstick MODERATE (A) NEGATIVE   Bilirubin Urine NEGATIVE NEGATIVE   Ketones, ur NEGATIVE NEGATIVE mg/dL   Protein, ur >=300 (A) NEGATIVE mg/dL   Nitrite NEGATIVE NEGATIVE   Leukocytes,Ua SMALL (A) NEGATIVE   RBC / HPF 21-50 0 - 5 RBC/hpf   WBC, UA 21-50 0 - 5 WBC/hpf   Bacteria, UA FEW (A) NONE SEEN   Squamous Epithelial / LPF 0-5 0 - 5    Comment: Performed at [facility], [place] [address] [place], [place] [address]  Sodium, urine, random     Status: None   Collection Time: 12/24/19  5:37 PM  Result Value Ref Range   Sodium, Ur 115 mmol/L    Comment: Performed at [facility] [address] [place], [place] [address]  Creatinine, urine, random     Status: None   Collection Time: 12/24/19  5:37 PM  Result Value Ref Range   Creatinine, Urine 29.16 mg/dL    Comment: Performed at [facility] [address], [place], [place] [address]  Glucose, capillary     Status: Abnormal   Collection Time: 12/24/19  8:10 PM  Result Value Ref Range   Glucose-Capillary 134 (H) 70 - 99 mg/dL    Comment: Glucose reference range applies only to samples taken after fasting for at least 8 hours.  Heparin level (unfractionated)     Status: Abnormal   Collection Time: 12/24/19  8:21 PM  Result Value Ref Range   Heparin Unfractionated 0.21 (L) 0.30 - 0.70 IU/mL    Comment: (NOTE) If heparin results are below expected values, and patient dosage has  been confirmed, suggest follow up testing of antithrombin III levels. Performed at [facility], [place] [address] [place], [place] [address]   Glucose, capillary     Status: Abnormal   Collection Time: 12/24/19 11:29 PM  Result Value Ref Range   Glucose-Capillary 133 (H) 70 - 99 mg/dL    Comment: Glucose reference range applies only to samples taken after fasting for at least 8 hours.  Glucose, capillary     Status: Abnormal   Collection Time: 12/25/19  4:15 AM  Result Value Ref Range   Glucose-Capillary 118 (H) 70 - 99 mg/dL    Comment: Glucose reference range applies only to samples taken after fasting for at least 8 hours.  Comprehensive metabolic panel     Status: Abnormal   Collection Time: 12/25/19  5:19 AM  Result Value Ref Range   Sodium 142 135 - 145 mmol/L   Potassium 4.8 3.5 - 5.1 mmol/L   Chloride 111 98 - 111 mmol/L   CO2 19 (L) 22 - 32 mmol/L   Glucose, Bld 99 70 - 99 mg/dL     Comment: Glucose reference range applies only to samples taken after fasting for at least 8 hours.   BUN 79 (H) 8 - 23 mg/dL   Creatinine, Ser 4.98 (H) 0.61 - 1.24 mg/dL   Calcium 9.0 8.9 - 10.3 mg/dL   Total Protein 5.9 (L) 6.5 - 8.1 g/dL   Albumin 2.4 (L) 3.5 - 5.0 g/dL   AST 17 15 - 41 U/L   ALT 12 0 - 44 U/L   Alkaline Phosphatase 53 38 - 126 U/L   Total Bilirubin 0.5 0.3 - 1.2 mg/dL   GFR calc non Af Amer 11 (L) >60 mL/min   GFR calc Af Amer 13 (L) >60 mL/min   Anion gap 12 5 - 15    Comment: Performed at [facility] [address] [place], [place] [address]  Heparin level (unfractionated)     Status: Abnormal   Collection Time: 12/25/19  5:19 AM  Result Value Ref Range   Heparin Unfractionated 0.15 (L) 0.30 - 0.70 IU/mL    Comment: (NOTE) If heparin results are below expected values, and patient dosage has  been confirmed, suggest follow up testing of antithrombin III levels. Performed at [facility], [place] [address] [place] [address]   CBC     Status: Abnormal   Collection Time: 12/25/19  5:19 AM  Result Value Ref Range   WBC 17.6 (H) 4.0 - 10.5 K/uL   RBC 3.11 (L) 4.22 - 5.81 MIL/uL   Hemoglobin 9.1 (L) 13.0 - 17.0  g/dL   HCT 28.2 (L) 39 - 52 %   MCV 90.7 80.0 - 100.0 fL   MCH 29.3 26.0 - 34.0 pg   MCHC 32.3 30.0 - 36.0 g/dL   RDW 15.1 11.5 - 15.5 %   Platelets 243 150 - 400 K/uL   nRBC 0.0 0.0 - 0.2 %    Comment: Performed at [facility], [place] [address] [place], [place] [address]  Lipid panel     Status: None   Collection Time: 12/25/19  5:19 AM  Result Value Ref Range   Cholesterol 120 0 - 200 mg/dL   Triglycerides 68 <150 mg/dL   HDL 58 >40 mg/dL   Total CHOL/HDL Ratio 2.1 RATIO   VLDL 14 0 - 40 mg/dL   LDL Cholesterol 48 0 - 99 mg/dL    Comment:        Total Cholesterol/HDL:CHD Risk Coronary Heart Disease Risk Table                     Men   Women  1/2 Average Risk   3.4   3.3  Average Risk       5.0   4.4  2 X Average  Risk   9.6   7.1  3 X Average Risk  23.4   11.0        Use the calculated Patient Ratio above and the CHD Risk Table to determine the patient's CHD Risk.        ATP III CLASSIFICATION (LDL):  <100     mg/dL   Optimal  100-129  mg/dL   Near or Above                    Optimal  130-159  mg/dL   Borderline  160-189  mg/dL   High  >190     mg/dL   Very High Performed at [facility] [address] [place], [place] [address]   Glucose, capillary     Status: Abnormal   Collection Time: 12/25/19  7:30 AM  Result Value Ref Range   Glucose-Capillary 103 (H) 70 - 99 mg/dL    Comment: Glucose reference range applies only to samples taken after fasting for at least 8 hours.     ROS: General some fever but no chills weakness no malaise Eyes no visual complaints Ears nose throat no hearing loss epistaxis or sore throat Cardiovascular no angina has dyspnea on exertion but none to conversation dyspnea has improved since admission Respiratory denies cough sputum hemoptysis Abdominal no change in bowel habits blood in stools no nausea vomiting or pain Urogenital no urgency frequency dysuria nocturia does have a history of urinary retention with history of urinary catheterization Neurologic history of stroke continues on Plavix Musculoskeletal no nonsteroidal anti-inflammatory drug use no arthritis or joint pain Dermatologic no skin rash no itching Endocrine history of diabetes  Physical Exam: Vitals:   12/25/19 0555 12/25/19 0728  BP: (!) 155/81 (!) 168/85  Pulse: 75 72  Resp: 19 18  Temp: 99.5 F (37.5 C) 100.1 F (37.8 C)  SpO2: 96% 95%     General: Alert nondistressed HEENT: Normocephalic atraumatic oropharynx was clear  eyes: Pupils round equal reactive no icterus no pallor Neck: Supple no thyromegaly adenopathy JVP not elevated Heart: Regular rate and rhythm no murmurs rubs gallops Lungs: Clear to auscultation no wheeze rales Abdomen: Soft nontender no  organosplenomegaly Extremities: No cyanosis clubbing or edema Skin: Bronzed  appearance with some ecchymosis Neuro: Grossly intact  Assessment/Plan: 1.Chronic kidney disease stage V.  Probably secondary to diabetes and diabetic nephropathy.  Patient has stated that he has discussed dialysis options with his primary nephrologist.  At this point I do not see any indication for dialysis.  Would continue to avoid nephrotoxins.  No ACE inhibitor's ARB use, no NSAIDs no Cox 2 inhibitors and would avoid the use of IV contrast.  Patient had previously been admitted with Pseudomonas sepsis 11/06/2019-11/25/2019 2. Hypertension/volume  -hypertensive urgency patient had discontinued lisinopril.  Blood pressure elevated on admission.  Would increase amlodipine to 10 mg daily and add hydralazine 50 mg 3 times daily to her regimen. 3.  Congestive heart failure.  I do not see patient having been administered diuretics.   .  Patient was taking 80 mg twice daily of Lasix as an outpatient.  I think it is reasonable to restart this medication 4.  Febrile illness with increased white count.  This would be concerning for pneumonia process.  His chest x-ray appeared more consistent with mild interstitial edema than pneumonia.  Would also recommend blood cultures and urine cultures.  May not be a bad idea to start empiric antibiotic coverage.  I will defer this to his primary service 5.  Anemia check iron studies evaluate for ESA patient is receiving ESA and iron as outpatient. 6.  Diabetes mellitus as per primary service.   Sherril Croon 12/25/2019, 10:27 AM

## 2019-12-25 NOTE — Progress Notes (Addendum)
Pharmacy Antibiotic Note  Dennis Macias is a 69 y.o. male admitted on 12/24/2019 with UTI.  Pharmacy has been consulted for ciprofloxacin dosing.  UA showing rare bacteria, WBC>50. WBC 17.6, temp 100.1, Scr 4.98 (CrCl 14 mL/min). UCx showing >100k pseudomonas. Previous UCx on 11/07/2019 grew pseudomonas I to imipenem. Has allergy listed for cefepime and encephalopathy.   Plan: Cipro 250 mg every 24 hours  Monitor renal fx, cx results, clinical pic, and duration of therapy  Height: 6' (182.9 cm) Weight: 71.7 kg (158 lb) IBW/kg (Calculated) : 77.6  Temp (24hrs), Avg:99.4 F (37.4 C), Min:98.8 F (37.1 C), Max:100.1 F (37.8 C)  Recent Labs  Lab 12/24/19 0852 12/25/19 0519  WBC 13.8* 17.6*  CREATININE 4.74* 4.98*    Estimated Creatinine Clearance: 14.4 mL/min (A) (by C-G formula based on SCr of 4.98 mg/dL (H)).    Allergies  Allergen Reactions  . Cefepime Other (See Comments)    Pt had BAD encephalopathy from Cefepime    Antimicrobials this admission: Ciprofloxacin 6/18 >>   Dose adjustments this admission: N/A  Microbiology results: 6/18 BCx: sent 6/18 UCx: sent 6/17 UCx: >100k pseudomonas aeruginosa   Thank you for allowing pharmacy to be a part of this patient's care.  Antonietta Jewel, PharmD, [place] Clinical Pharmacist  Phone: [phone] 12/25/2019 1:35 PM  Please check AMION for all [place] phone numbers After 10:00 PM, call [place] [phone]

## 2019-12-26 ENCOUNTER — Other Ambulatory Visit: Payer: Self-pay | Admitting: Internal Medicine

## 2019-12-26 LAB — COMPREHENSIVE METABOLIC PANEL
ALT: 10 U/L (ref 0–44)
AST: 16 U/L (ref 15–41)
Albumin: 2.2 g/dL — ABNORMAL LOW (ref 3.5–5.0)
Alkaline Phosphatase: 44 U/L (ref 38–126)
Anion gap: 12 (ref 5–15)
BUN: 84 mg/dL — ABNORMAL HIGH (ref 8–23)
CO2: 18 mmol/L — ABNORMAL LOW (ref 22–32)
Calcium: 8.7 mg/dL — ABNORMAL LOW (ref 8.9–10.3)
Chloride: 111 mmol/L (ref 98–111)
Creatinine, Ser: 5.11 mg/dL — ABNORMAL HIGH (ref 0.61–1.24)
GFR calc Af Amer: 12 mL/min — ABNORMAL LOW (ref >60)
GFR calc non Af Amer: 11 mL/min — ABNORMAL LOW (ref >60)
Glucose, Bld: 96 mg/dL (ref 70–99)
Potassium: 4.6 mmol/L (ref 3.5–5.1)
Sodium: 141 mmol/L (ref 135–145)
Total Bilirubin: 0.7 mg/dL (ref 0.3–1.2)
Total Protein: 5.4 g/dL — ABNORMAL LOW (ref 6.5–8.1)

## 2019-12-26 LAB — URINE CULTURE: Culture: >=100000 — AB

## 2019-12-26 LAB — CBC
HCT: 26.5 % — ABNORMAL LOW (ref 39.0–52.0)
Hemoglobin: 8.5 g/dL — ABNORMAL LOW (ref 13.0–17.0)
MCH: 28.9 pg (ref 26.0–34.0)
MCHC: 32.1 g/dL (ref 30.0–36.0)
MCV: 90.1 fL (ref 80.0–100.0)
Platelets: 223 10*3/uL (ref 150–400)
RBC: 2.94 MIL/uL — ABNORMAL LOW (ref 4.22–5.81)
RDW: 15.2 % (ref 11.5–15.5)
WBC: 12.5 10*3/uL — ABNORMAL HIGH (ref 4.0–10.5)
nRBC: 0.2 % (ref 0.0–0.2)

## 2019-12-26 LAB — GLUCOSE, CAPILLARY
Glucose-Capillary: 90 mg/dL (ref 70–99)
Glucose-Capillary: 87 mg/dL (ref 70–99)
Glucose-Capillary: 122 mg/dL — ABNORMAL HIGH (ref 70–99)

## 2019-12-26 LAB — HEPARIN LEVEL (UNFRACTIONATED)
Heparin Unfractionated: 0.33 IU/mL (ref 0.30–0.70)
Heparin Unfractionated: 0.34 IU/mL (ref 0.30–0.70)

## 2019-12-26 MED ORDER — HYDRALAZINE HCL 50 MG PO TABS
50.0000 mg | ORAL_TABLET | Freq: Three times a day (TID) | ORAL | 0 refills | Status: DC
Start: 2019-12-26 — End: 2019-12-28

## 2019-12-26 MED ORDER — DARBEPOETIN ALFA 40 MCG/0.4ML IJ SOSY
40.0000 ug | PREFILLED_SYRINGE | INTRAMUSCULAR | Status: DC
  Filled 2019-12-26: qty 0.4

## 2019-12-26 MED ORDER — HEPARIN SODIUM (PORCINE) 5000 UNIT/ML IJ SOLN
5000.0000 [IU] | Freq: Three times a day (TID) | INTRAMUSCULAR | Status: DC
  Administered 2019-12-26: 5000 [IU] via SUBCUTANEOUS
  Filled 2019-12-26: qty 1

## 2019-12-26 MED ORDER — INSULIN ASPART 100 UNIT/ML ~~LOC~~ SOLN
0.0000 [IU] | Freq: Three times a day (TID) | SUBCUTANEOUS | Status: DC
  Administered 2019-12-26: 1 [IU] via SUBCUTANEOUS

## 2019-12-26 MED ORDER — HYDRALAZINE HCL 50 MG PO TABS: 50.0000 mg | ORAL_TABLET | Freq: Three times a day (TID) | ORAL | 0 refills | Status: DC

## 2019-12-26 MED ORDER — CIPROFLOXACIN HCL 250 MG PO TABS: 500.0000 mg | ORAL_TABLET | Freq: Every day | ORAL | 0 refills | Status: DC

## 2019-12-26 MED ORDER — AMLODIPINE BESYLATE 10 MG PO TABS
10.0000 mg | ORAL_TABLET | Freq: Every day | ORAL | 0 refills | Status: DC
Start: 2019-12-26 — End: 2019-12-28

## 2019-12-26 MED ORDER — AMLODIPINE BESYLATE 10 MG PO TABS: 10.0000 mg | ORAL_TABLET | Freq: Every day | ORAL | 0 refills | Status: DC

## 2019-12-26 NOTE — Progress Notes (Signed)
[facility] ROUNDING NOTE   Subjective:   Dennis Macias is a 69 y.o. male.  With a history of chronic kidney disease baseline serum creatinine 3.5 mg/dL.  History of CVA 2017 history of diabetes with nephrotic range proteinuria, history of urinary retention requiring urinary catheterization.  History of hyperlipidemia.  History of congestive heart failure ejection fraction of 35%.  Admission 11/25/2019-12/04/2019 debility and encephalopathy.Admission 11/06/2019-11/25/2019  Status post cardiac catheterization 11/19/2019 75 stenosis mid LAD 80% stenosis ostial LAD to proximal LAD.  Recommended medical therapy.  Complicated by Pseudomonas bacteremia and bacteriuria.  History of cefepime neurotoxicity.  Acute CVA in the parietal region.  None ST elevated MI with decompensated congestive heart failure.  Physical deconditioning Admission 10/14/2019 to 10/22/2019 with decompensated congestive heart failure.   He presented with acute shortness of breath requiring oxygen therapy.  Also with hypertensive urgency.  12/24/2019  Blood pressure 156/66 pulse 65 temperature 98.4 O2 sats 97% room air.  Urine output 1.4 L  Weight 71.3 kg decreased from admission weight of 74.3 kg  Sodium 141 potassium 4.6 chloride 101 CO2 18 BUN 84 creatinine 5.11 glucose 96 calcium 8.7 albumin 2.2 WBC 12.5 hemoglobin 8.5  Renal ultrasound shows no hydronephrosis bladder wall thickening and pleural effusions  Chest x-ray was consistent with congestive heart failure  Amlodipine 10 mg daily, aspirin 81 mg daily Lipitor 80 mg daily carvedilol 25 mg twice daily Plavix 75 mg daily Proscar 5 mg daily, Flomax 0.4 mg daily, hydralazine 50 mg every 8 hours, furosemide 80 mg twice daily  IV heparin   Objective:  Vital signs in last 24 hours:  Temp:  [98.4 F (36.9 C)-100.1 F (37.8 C)] 98.4 F (36.9 C) (06/19 0610) Pulse Rate:  [65-72] 65 (06/19 0610) Resp:  [16-18] 16 (06/19 0610) BP: (142-168)/(65-85) 156/66 (06/19  0610) SpO2:  [95 %-98 %] 97 % (06/19 0610) Weight:  [71.3 kg] 71.3 kg (06/19 0119)  Weight change: -3.992 kg Filed Weights   12/24/19 0825 12/25/19 0555 12/26/19 0119  Weight: 75.3 kg 71.7 kg 71.3 kg    Intake/Output: I/O last 3 completed shifts: In: 724.6 [P.O.:580; I.V.:144.6] Out: 1875 [Urine:1875]   Intake/Output this shift:  Total I/O In: 360 [P.O.:360] Out: 725 [Urine:725]  General: Alert nondistressed HEENT: Normocephalic atraumatic oropharynx was clear  eyes: Pupils round equal reactive no icterus no pallor Neck: Supple no thyromegaly adenopathy JVP not elevated Heart: Regular rate and rhythm no murmurs rubs gallops Lungs: Clear to auscultation no wheeze rales Abdomen: Soft nontender no organosplenomegaly Extremities: No cyanosis clubbing or edema Skin: Bronzed appearance with some ecchymosis Neuro: Grossly intact  Basic Metabolic Panel: Recent Labs  Lab 12/24/19 0852 12/25/19 0519 12/26/19 0007  NA 140 142 141  K 5.2* 4.8 4.6  CL 108 111 111  CO2 18* 19* 18*  GLUCOSE 174* 99 96  BUN 74* 79* 84*  CREATININE 4.74* 4.98* 5.11*  CALCIUM 9.1 9.0 8.7*    Liver Function Tests: Recent Labs  Lab 12/24/19 0852 12/25/19 0519 12/26/19 0007  AST 16 17 16   ALT 12 12 10   ALKPHOS 57 53 44  BILITOT 0.6 0.5 0.7  PROT 6.4* 5.9* 5.4*  ALBUMIN 2.8* 2.4* 2.2*   No results for input(s): LIPASE, AMYLASE in the last 168 hours. No results for input(s): AMMONIA in the last 168 hours.  CBC: Recent Labs  Lab 12/23/19 1242 12/24/19 0852 12/25/19 0519 12/26/19 0007  WBC  --  13.8* 17.6* 12.5*  NEUTROABS  --  12.3*  --   --  HGB 9.0* 9.3* 9.1* 8.5*  HCT  --  29.7* 28.2* 26.5*  MCV  --  91.7 90.7 90.1  PLT  --  247 243 223    Cardiac Enzymes: No results for input(s): CKTOTAL, CKMB, CKMBINDEX, TROPONINI in the last 168 hours.  BNP: Invalid input(s): POCBNP  CBG: Recent Labs  Lab 12/25/19 1117 12/25/19 1643 12/25/19 2039 12/26/19 0116 12/26/19 0558   GLUCAP 87 112* 90 90 87    Microbiology: Results for orders placed or performed during the hospital encounter of 12/24/19  SARS Coronavirus 2 by RT PCR (hospital order, performed in [facility] hospital lab) Nasopharyngeal Nasopharyngeal Swab     Status: None   Collection Time: 12/24/19  8:52 AM   Specimen: Nasopharyngeal Swab  Result Value Ref Range Status   SARS Coronavirus 2 NEGATIVE NEGATIVE Final    Comment: (NOTE) SARS-CoV-2 target nucleic acids are NOT DETECTED.  The SARS-CoV-2 RNA is generally detectable in upper and lower respiratory specimens during the acute phase of infection. The lowest concentration of SARS-CoV-2 viral copies this assay can detect is 250 copies / mL. A negative result does not preclude SARS-CoV-2 infection and should not be used as the sole basis for treatment or other patient management decisions.  A negative result may occur with improper specimen collection / handling, submission of specimen other than nasopharyngeal swab, presence of viral mutation(s) within the areas targeted by this assay, and inadequate number of viral copies (<250 copies / mL). A negative result must be combined with clinical observations, patient history, and epidemiological information.  Fact Sheet for Patients:   StrictlyIdeas.no  Fact Sheet for Healthcare Providers: BankingDealers.co.za  This test is not yet approved or  cleared by the [place] FDA and has been authorized for detection and/or diagnosis of SARS-CoV-2 by FDA under an Emergency Use Authorization (EUA).  This EUA will remain in effect (meaning this test can be used) for the duration of the COVID-19 declaration under Section 564(b)(1) of the Act, 21 U.S.C. section 360bbb-3(b)(1), unless the authorization is terminated or revoked sooner.  Performed at [facility], [place] [address], [place], [place] [address]   Culture, Urine     Status: Abnormal  (Preliminary result)   Collection Time: 12/24/19  5:37 PM   Specimen: Urine, Random  Result Value Ref Range Status   Specimen Description URINE, RANDOM  Final   Special Requests NONE  Final   Culture (A)  Final    >=100,000 COLONIES/mL PSEUDOMONAS AERUGINOSA SUSCEPTIBILITIES TO FOLLOW Performed at [facility], [place] [address], [place], [place] [address]    Report Status PENDING  Incomplete    Coagulation Studies: No results for input(s): LABPROT, INR in the last 72 hours.  Urinalysis: Recent Labs    12/24/19 1737 12/25/19 1123  COLORURINE YELLOW YELLOW  LABSPEC 1.011 1.012  PHURINE 6.0 6.0  GLUCOSEU 50* 50*  HGBUR MODERATE* MODERATE*  BILIRUBINUR NEGATIVE NEGATIVE  KETONESUR NEGATIVE NEGATIVE  PROTEINUR >=300* >=300*  NITRITE NEGATIVE NEGATIVE  LEUKOCYTESUR SMALL* LARGE*      Imaging: US RENAL  Result Date: 12/25/2019 CLINICAL DATA:  Acute renal insufficiency EXAM: RENAL / URINARY TRACT ULTRASOUND COMPLETE COMPARISON:  11/29/2019 FINDINGS: Right Kidney: Renal measurements: 12.0 x 4.9 x 5.5 cm = volume: 169 mL . No hydronephrosis. Normal renal echogenicity and cortical thickness. Trace perinephric fluid. Left Kidney: Renal measurements: 11.3 x 5.7 x 4.6 cm = volume: 155 mL. No hydronephrosis. Normal echogenicity and renal cortical thickness. Trace perinephric fluid. Bladder:  Appears thick walled, but is underdistended. Other: Bilateral pleural effusions incidentally noted. IMPRESSION: 1. No hydronephrosis or other explanation for renal insufficiency. 2. Bladder wall thickening which could be artifactual in the setting of underdistention. Correlate with any symptoms of cystitis or bladder outlet obstruction. 3. Bilateral pleural effusions. Electronically Signed   By: Abigail Miyamoto M.D.   On: 12/25/2019 09:19   DG Chest Portable 1 View  Result Date: 12/24/2019 CLINICAL DATA:  Shortness of breath EXAM: PORTABLE CHEST 1 VIEW COMPARISON:  11/29/2019 FINDINGS: Cardiac shadow  is enlarged but stable. Aortic calcifications are again seen. Vascular congestion with mild interstitial edema and small effusions is seen. This is slightly worse than that noted on the prior exam. No bony abnormality is noted. IMPRESSION: Changes consistent with CHF Electronically Signed   By: Inez Catalina M.D.   On: 12/24/2019 09:28   ECHOCARDIOGRAM COMPLETE  Result Date: 12/24/2019    ECHOCARDIOGRAM REPORT   Patient Name:   Dennis Macias Date of Exam: 12/24/2019 Medical Rec #:  [id number]  Height:       72.0 in Accession #:    [id number] Weight:       166.0 lb Date of Birth:  01/17/1951  BSA:          1.968 m Patient Age:    43 years   BP:           171/92 mmHg Patient Gender: M          HR:           76 bpm. Exam Location:  Inpatient Procedure: 2D Echo, Cardiac Doppler and Color Doppler                         STAT ECHO Reported to: Dr Boykin Reaper on 12/24/2019 4:38:00 PM. Indications:    CHF-Acute Systolic  History:        Patient has prior history of Echocardiogram examinations, most                 recent 10/15/2019. Signs/Symptoms:Shortness of Breath; Risk                 Factors:Hypertension, Diabetes, Dyslipidemia and Former Smoker.                 CKD.  Sonographer:    Clayton Lefort RDCS (AE) Referring Phys: [id number] [place]  1. Left ventricular ejection fraction, by estimation, is 35 to 40%. The left ventricle has moderately decreased function. The left ventricle demonstrates global hypokinesis. The left ventricular internal cavity size was mildly dilated. There is mild left ventricular hypertrophy. Left ventricular diastolic parameters are consistent with Grade II diastolic dysfunction (pseudonormalization).  2. Right ventricular systolic function is normal. The right ventricular size is normal. There is moderately elevated pulmonary artery systolic pressure.  3. Left atrial size was moderately dilated.  4. Moderate pleural effusion.  5. The mitral valve is normal in structure. Moderate  mitral valve regurgitation.  6. The aortic valve is normal in structure. Aortic valve regurgitation is not visualized. Mild aortic valve sclerosis is present, with no evidence of aortic valve stenosis.  7. The inferior vena cava is dilated in size with >50% respiratory variability, suggesting right atrial pressure of 8 mmHg. FINDINGS  Left Ventricle: Left ventricular ejection fraction, by estimation, is 35 to 40%. The left ventricle has moderately decreased function. The left ventricle demonstrates global hypokinesis. The left ventricular internal cavity size was mildly dilated.  There is mild left ventricular hypertrophy. Left ventricular diastolic parameters are consistent with Grade II diastolic dysfunction (pseudonormalization). Right Ventricle: The right ventricular size is normal. No increase in right ventricular wall thickness. Right ventricular systolic function is normal. There is moderately elevated pulmonary artery systolic pressure. The tricuspid regurgitant velocity is 3.28 m/s, and with an assumed right atrial pressure of 8 mmHg, the estimated right ventricular systolic pressure is 52.8 mmHg. Left Atrium: Left atrial size was moderately dilated. Right Atrium: Right atrial size was normal in size. Pericardium: There is no evidence of pericardial effusion. Mitral Valve: The mitral valve is normal in structure. There is mild thickening of the mitral valve leaflet(s). Moderate mitral valve regurgitation. MV peak gradient, 5.3 mmHg. The mean mitral valve gradient is 2.0 mmHg. Tricuspid Valve: The tricuspid valve is normal in structure. Tricuspid valve regurgitation is mild. Aortic Valve: The aortic valve is normal in structure. Aortic valve regurgitation is not visualized. Mild aortic valve sclerosis is present, with no evidence of aortic valve stenosis. There is mild calcification of the aortic valve. Aortic valve mean gradient measures 4.0 mmHg. Aortic valve peak gradient measures 6.7 mmHg. Aortic valve  area, by VTI measures 2.68 cm. Pulmonic Valve: The pulmonic valve was not well visualized. Pulmonic valve regurgitation is not visualized. Aorta: The aortic root and ascending aorta are structurally normal, with no evidence of dilitation. Venous: The inferior vena cava is dilated in size with greater than 50% respiratory variability, suggesting right atrial pressure of 8 mmHg. IAS/Shunts: No atrial level shunt detected by color flow Doppler. Additional Comments: A pacer wire is visualized. There is a moderate pleural effusion.  LEFT VENTRICLE PLAX 2D LVIDd:         5.80 cm  Diastology LVIDs:         3.90 cm  LV e' lateral:   4.54 cm/s LV PW:         1.20 cm  LV E/e' lateral: 25.6 LV IVS:        1.40 cm  LV e' medial:    4.87 cm/s LVOT diam:     2.30 cm  LV E/e' medial:  23.8 LV SV:         76 LV SV Index:   39 LVOT Area:     4.15 cm                          3D Volume EF:                         3D EF:        44 %                         LV EDV:       202 ml                         LV ESV:       113 ml                         LV SV:        89 ml RIGHT VENTRICLE             IVC RV Basal diam:  3.70 cm     IVC diam: 2.30 cm RV Mid diam:    2.70 cm RV S prime:  10.50 cm/s TAPSE (M-mode): 2.3 cm LEFT ATRIUM             Index       RIGHT ATRIUM           Index LA diam:        3.80 cm 1.93 cm/m  RA Area:     19.90 cm LA Vol (A2C):   81.0 ml 41.15 ml/m RA Volume:   59.60 ml  30.28 ml/m LA Vol (A4C):   60.4 ml 30.69 ml/m LA Biplane Vol: 70.3 ml 35.72 ml/m  AORTIC VALVE AV Area (Vmax):    2.84 cm AV Area (Vmean):   2.34 cm AV Area (VTI):     2.68 cm AV Vmax:           129.00 cm/s AV Vmean:          88.900 cm/s AV VTI:            0.284 m AV Peak Grad:      6.7 mmHg AV Mean Grad:      4.0 mmHg LVOT Vmax:         88.30 cm/s LVOT Vmean:        50.000 cm/s LVOT VTI:          0.183 m LVOT/AV VTI ratio: 0.64  AORTA Ao Root diam: 3.30 cm Ao Asc diam:  3.40 cm MITRAL VALVE                 TRICUSPID VALVE MV Area (PHT):  4.80 cm      TR Peak grad:   43.0 mmHg MV Peak grad:  5.3 mmHg      TR Vmax:        328.00 cm/s MV Mean grad:  2.0 mmHg MV Vmax:       1.15 m/s      SHUNTS MV Vmean:      58.7 cm/s     Systemic VTI:  0.18 m MV Decel Time: 158 msec      Systemic Diam: 2.30 cm MR Peak grad:    143.5 mmHg MR Mean grad:    89.0 mmHg MR Vmax:         599.00 cm/s MR Vmean:        442.0 cm/s MR PISA:         1.57 cm MR PISA Eff ROA: 10 mm MR PISA Radius:  0.50 cm MV E velocity: 116.00 cm/s MV A velocity: 87.80 cm/s MV E/A ratio:  1.32 Glori Bickers MD Electronically signed by Glori Bickers MD Signature Date/Time: 12/24/2019/5:04:22 PM    Final      Medications:    . amLODipine  10 mg Oral Daily  . aspirin EC  81 mg Oral Daily  . atorvastatin  80 mg Oral QHS  . carvedilol  25 mg Oral BID WC  . ciprofloxacin  250 mg Oral Q breakfast  . clopidogrel  75 mg Oral Daily  . dorzolamide-timolol  1 drop Right Eye BID  . finasteride  5 mg Oral Daily  . furosemide  80 mg Oral BID  . heparin  5,000 Units Subcutaneous Q8H  . hydrALAZINE  50 mg Oral Q8H  . insulin aspart  0-9 Units Subcutaneous TID WC  . isosorbide mononitrate  60 mg Oral Daily  . tamsulosin  0.4 mg Oral QPC supper  . vitamin B-12  1,000 mcg Oral Daily   acetaminophen **OR** acetaminophen, labetalol, nitroGLYCERIN  Assessment/ Plan:  1.Chronic kidney disease stage V.  Probably secondary to diabetes and diabetic nephropathy.  Patient has stated that he has discussed dialysis options with his primary nephrologist.  At this point I do not see any indication for dialysis.  Would continue to avoid nephrotoxins.  No ACE inhibitor's ARB use, no NSAIDs no Cox 2 inhibitors and would avoid the use of IV contrast.  Patient had previously been admitted with Pseudomonas sepsis 11/06/2019-11/25/2019 2. Hypertension/volume  -hypertensive urgency patient had discontinued lisinopril.  Blood pressure elevated on admission.  Appears to have improved.  Continue amlodipine 10  mg daily, hydralazine 50 mg 3 times daily and Coreg 25 mg twice daily. 3.  Congestive heart failure.  I do not see patient having been administered diuretics.   .    Continue Lasix 80 mg twice daily 4.  Febrile illness with increased white count.  This would be concerning for pneumonia process.  His chest x-ray appeared more consistent with mild interstitial edema than pneumonia.  Blood cultures negative at this time.  Urine culture with greater than 100,000 colonies Pseudomonas aeruginosa's.  Will defer to primary service. 5.  Anemia .  Iron saturations 50%.  Darbepoetin 40 mcg weekly 6.  Diabetes mellitus as per primary service.    LOS: 2 Sherril Croon @TODAY @6 :53 AM

## 2019-12-26 NOTE — Progress Notes (Signed)
Subjective:   He is feeling well today, asking when he can go home. He denies shortness of breath, chest pain. He knows he is taking his fluid pills at home but girlfriend manages the rest of his medications.   Objective:  Vital signs in last 24 hours: Vitals:   12/25/19 2040 12/26/19 0119 12/26/19 0123 12/26/19 0610  BP: (!) 150/75  (!) 147/65 (!) 156/66  Pulse: 65  69 65  Resp: 16  16 16   Temp: 98.8 F (37.1 C)  98.9 F (37.2 C) 98.4 F (36.9 C)  TempSrc: Oral  Oral Oral  SpO2: 96%  96% 97%  Weight:  71.3 kg    Height:       Constitution: NAD, sitting up in bed Cardio: RRR, no m/r/g, no LE edema  Respiratory: CTA, no w/r/r Neuro: normal affect, a&ox3  Echocardiogram: 1. Left ventricular ejection fraction, by estimation, is 35 to 40%. The  left ventricle has moderately decreased function. The left ventricle  demonstrates global hypokinesis. The left ventricular internal cavity size  was mildly dilated. There is mild  left ventricular hypertrophy. Left ventricular diastolic parameters are  consistent with Grade II diastolic dysfunction (pseudonormalization).  2. Right ventricular systolic function is normal. The right ventricular  size is normal. There is moderately elevated pulmonary artery systolic  pressure.  3. Left atrial size was moderately dilated.  4. Moderate pleural effusion.  5. The mitral valve is normal in structure. Moderate mitral valve  regurgitation.  6. The aortic valve is normal in structure. Aortic valve regurgitation is  not visualized. Mild aortic valve sclerosis is present, with no evidence  of aortic valve stenosis.  7. The inferior vena cava is dilated in size with >50% respiratory  variability, suggesting right atrial pressure of 8 mmHg.  Previous Cardiac Cath: (11/19/2019)  Mid LAD lesion is 75% stenosed.  Ost LAD to Prox LAD lesion is 80% stenosed. In the cranial views, it is seen that the disease extends up to the ostial LAD,  and there is significant calcification.  LV end diastolic pressure is normal.  There is no aortic valve stenosis.  PCI of the proximal - ostial LAD would likely require IVUS with possible atherectomy. There is a mid to distal LAD lesion which is eccentric.   Given his lack of angina and issues with renal insufficiency, would not pursue PCI at this time. Would reconsider based on his renal function.   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   Tropinemia 2/2 Demand Ischemia HTN emergency  Blood pressure significantly improved with increased medications. Troponins initially elevated at admission likely secondary to demand with volume overload and hypertension. Transitioned yesterday to home dose of lasix.   - stop heparin - continue norvasc 10 mg, coreg 25 mg bid, imdur 60 mg 24 hr, hydralazine 80 mg tid - continue lasix 80 mg bid  - cont. Atorvastatin 80 mg qhs  - cont. Asa 81 mg qd  Acute on Chronic HF Exacerbation EF slightly reduced compared to prior. Initially diuresed  with IV lasix, now back to his home dose of 80 mg bid. Appears euvolemic on exam and SOB has resolved.   - cont. Home lasix 80 mg bid  - cont. Coreg 25 mg bid  CKD Stage IV Nephrotic Syndrome Nephrotic syndrome thought to be secondary to history of diabetes. A1c now normal since 2017. Received 80 mg IV bid after admission, with 120 mg IV lasix yesterday with resumption of home 80 mg po bid. Creatinine stable from yesterday  at 5.11. He sees [facility] in the outpatient setting and has no current indication for dialysis.    - nephrology consulted, appreciate recommendations    Pseudomonas UTI History of pseudomonas UTI during last admission several months ago. UA significant for leukocytes and bacteria. Leukocytosis significantly improved today. Culture growing pseudomonas which previously was susceptible to ciprofloxacin  - continue ciprofloxacin 5 days  - urine consistent with pseudomonas, f/u susceptibilities. Previously susceptible to cipro.  - f/u blood cultures   BPH - cont. proscar 5 mg and flomax .4 mg   Hx of left ICA Ischemic Stroke Patient on asa and plavix after stroke one month ago. Was intended to be transitioned to plavix only from a neurology perspective due to hematuria with follow-up with cardiology for recommendations concerning his aspirin.  - cont. Atorvastatin  - f/u with cardiology concerning long term aspirin, continue plavix 75 mg   VTE: heparin  IVF: none Diet: renal  Code: full   Dispo: Anticipated discharge today or tomorrow.   Seawell, Jaimie A, DO 12/26/2019, 6:31 AM Pager: [phone] After 5pm on weekdays and 1pm on weekends: On Call Pager: [phone]

## 2019-12-26 NOTE — Discharge Summary (Addendum)
Name: Dennis Macias MRN: [id number] DOB: 1951-06-27 69 y.o. PCP: Dennis Hansen, Macias  Date of Admission: 12/24/2019  8:15 AM Date of Discharge: 12/26/19 Attending Physician: Dennis Contes, Macias  Discharge Diagnosis: 1. Troponemia 2/2 Hypertensive Emergency 2. Acute on Chronic Combined Heart Failure Exacerbation 3. CKD Stage IV 2/2 Nephrotic Syndrome 4. Iron Deposition  5. CAD 6. Hx of CVA 7. Pseudomonas UTI  8. BPH 9. Anemia of Chronic Disease and IDA  Discharge Medications: Allergies as of 12/26/2019       Reactions   Cefepime Other (See Comments)   Pt had BAD encephalopathy from Cefepime        Medication List     STOP taking these medications    ferrous gluconate 324 MG tablet Commonly known as: FERGON       TAKE these medications    Accu-Chek FastClix Lancets Misc Check blood sugar up to 7 times a week as instructed   Accu-Chek Guide test strip Generic drug: glucose blood Check blood sugar up to 7 times a week as instructed   Accu-Chek Guide w/Device Kit 1 each by Does not apply route daily. Check blood sugar as instructed up to 7 times a week   acetaminophen 325 MG tablet Commonly known as: TYLENOL Take 1-2 tablets (325-650 mg total) by mouth every 4 (four) hours as needed for mild pain.   amLODipine 10 MG tablet Commonly known as: NORVASC Take 1 tablet (10 mg total) by mouth daily. Start taking on: December 27, 2019 What changed:  medication strength how much to take   aspirin EC 81 MG tablet Take 1 tablet (81 mg total) by mouth daily.   atorvastatin 80 MG tablet Commonly known as: LIPITOR Take 1 tablet (80 mg total) by mouth at bedtime. IM program   carvedilol 25 MG tablet Commonly known as: COREG Take 1 tablet (25 mg total) by mouth 2 (two) times daily with a meal.   ciprofloxacin 250 MG tablet Commonly known as: CIPRO Take 2 tablets (500 mg total) by mouth daily with breakfast. Start taking on: December 27, 2019   clopidogrel 75 MG  tablet Commonly known as: PLAVIX Take 1 tablet (75 mg total) by mouth daily.   dorzolamide-timolol 22.3-6.8 MG/ML ophthalmic solution Commonly known as: COSOPT Place 1 drop into the right eye 2 (two) times daily.   finasteride 5 MG tablet Commonly known as: PROSCAR Take 1 tablet (5 mg total) by mouth daily.   furosemide 80 MG tablet Commonly known as: LASIX Take 1 tablet (80 mg total) by mouth 2 (two) times daily.   hydrALAZINE 50 MG tablet Commonly known as: APRESOLINE Take 1 tablet (50 mg total) by mouth every 8 (eight) hours.   isosorbide mononitrate 60 MG 24 hr tablet Commonly known as: IMDUR Take 1 tablet (60 mg total) by mouth daily.   melatonin 3 MG Tabs tablet Take 1 tablet (3 mg total) by mouth at bedtime.   tamsulosin 0.4 MG Caps capsule Commonly known as: FLOMAX Take 1 capsule (0.4 mg total) by mouth daily after supper.   vitamin B-12 1000 MCG tablet Commonly known as: CYANOCOBALAMIN Take 1 tablet (1,000 mcg total) by mouth daily.        Disposition and follow-up:   [name] was discharged from Dennis Macias in Stable condition.  At the Macias follow up visit please address:  1. Troponemia 2/2 Hypertensive Emergency: Norvasc increased to 90m, started on hydralazine 50 mg tid  2. Acute on Chronic Combined Heart  Failure Exacerbation: mild increase in volume. D/c with 65m lasix bid home dose  3. CKD Stage IV 2/2 Nephrotic Syndrome: will f/u with nephrology  4. Iron Deposition: On MRI in 07/2019. Iron panel w/o increased iron at the time. On this admission TIBC 185, Sats 50. He did receive feraheme shortly before admission, though.  5. CAD: continue asa 81 mg per cardiology  6. Hx of CVA: continue plavix 75 mg per neurology  7. Pseudomonas UTI: previously susceptible to Cipro - continue cipro for three more days. Hx of pseudomonas bacteremia. Blood cultures still pending  8. BPH 9. Anemia of Chronic Disease and IDA CAD: cont. Asa 81  mg CVA Hx: continue plavix 75 mg  2.  Labs / imaging needed at time of follow-up: CBC, CMP   3.  Pending labs/ test needing follow-up: Blood Culture   Follow-up Appointments:  Follow-up Information     CMitzi Hansen Macias.   Specialty: Internal Medicine Why: Pleasr follow up in a week Contact information[id number] list: 1. Troponemia 2/2 Hypertensive Emergency 2. Acute on Chronic Combined Heart Failure Exacerbation 3. CKD Stage IV 2/2 Nephrotic Syndrome  Dennis Bigginsis a 89yomale with PMH HTN, HLD, CKD Stage IV, Nephrotic syndrome, combined HF (EF 40-45%), anemia of chronic disease who presented with sudden onset shortness of breath that had started the night before in addition to elevated troponins and systolic blood pressure >>025 ECG showed no new signs of ischemia. Cardiology was consulted for troponin 481 >Dennis Macias He was started on heparin in the ED. Patient previously underwent cardiac catheterization but PCI was held due to patient being asymptomatic and the extend of his CKD. BNP was 1554 with previous in April being 4,075. Echo was done which showed mild decrease in EF to 35-40% from 40-45%.  Overall, his elevated troponins was thought to be type II secondary to his volume overload and elevated blood pressure and recommendations was medical management.  Nephrology was also consulted due to increase in baseline creatinine. [place] He was diuresed with extra IV lasix and then transitioned to his home dose IV. For his hypertensive urgency his norvasc was increased to 156mand he was started on hydralazine 50 mg tid. No current indication for dialysis and [facility].   4. Iron Deposition  Of note, MRI abd in 07/2019 showed increased iron deposition within the liver. Iron studies at that time were  unremarkable. Repeat studies during this admission showed increase in iron sats to 50, ferritin of 92, but patient did receive feraheme two days prior to admission   5. CAD 6. Hx of CVA In the past he was continued on plavix 75 mg only per neurology. Discussed asa with plavix with cardiology during this admission, and they would like to keep aspirin as well. Patient was continued on both aspirin and plavix at discharge.   7. Pseudomonas UTI Patient had leukocytosis on admission with increase on day 2. UA showed growth of pseudomonas. Previously had admission for pseudomonas UTI which was susceptible to cipro. He was started on this and will finish antibiotics after discharge. Blood cultures are pending and will be followed-up.    Discharge Vitals:   BP 120/60 (BP Location: Right Arm)   Pulse (!) 52  Temp 97.7 F (36.5 C) (Oral)   Resp 16   Ht 6' (1.829 m)   Wt 71.3 kg Comment: scale b  SpO2 98%   BMI 21.32 kg/m   Pertinent Labs, Studies, and Procedures:   Echo 12/24/19 1. Left ventricular ejection fraction, by estimation, is 35 to 40%. The  left ventricle has moderately decreased function. The left ventricle  demonstrates global hypokinesis. The left ventricular internal cavity size  was mildly dilated. There is mild  left ventricular hypertrophy. Left ventricular diastolic parameters are  consistent with Grade II diastolic dysfunction (pseudonormalization).   2. Right ventricular systolic function is normal. The right ventricular  size is normal. There is moderately elevated pulmonary artery systolic  pressure.   3. Left atrial size was moderately dilated.   4. Moderate pleural effusion.   5. The mitral valve is normal in structure. Moderate mitral valve  regurgitation.   6. The aortic valve is normal in structure. Aortic valve regurgitation is  not visualized. Mild aortic valve sclerosis is present, with no evidence  of aortic valve stenosis.   7. The inferior vena cava is  dilated in size with >50% respiratory  variability, suggesting right atrial pressure of 8 mmHg.   US Renal 12/25/19 IMPRESSION: 1. No hydronephrosis or other explanation for renal insufficiency. 2. Bladder wall thickening which could be artifactual in the setting of underdistention. Correlate with any symptoms of cystitis or bladder outlet obstruction. 3. Bilateral pleural effusions. Electronically Signed   By: Abigail Miyamoto M.D.   On: 12/25/2019 09:19  DG Chest 12/24/19 Changes consistent with CHF  Electronically Signed   By: Inez Catalina M.D.   On: 12/24/2019 09:28   Discharge Instructions: Discharge Instructions     Diet - low sodium heart healthy   Complete by: As directed    Discharge instructions   Complete by: As directed    You were hospitalized for heart failure exacerbation and hypertensive emergency. Thank you for allowing [place] to be part of your care.   Please schedule a follow-up appointment with:  Nephrology (kidney doctors) in the next week  And  Your primary care physician in the next week.   Please note these changes made to your medications:   Start taking Ciprofloxacin 500 mg - take two tablet with breakfast for the next three days  Hydralazine 50 mg - take one tablet three times per day.  Amlodipine (norvasc) 10 mg - take one tablet per day  Please STOP taking  Amlodipine 5 mg tablet  Stop your iron supplement until completing antibiotics for your UTI.   Increase activity slowly   Complete by: As directed        Signed: Molli Hazard A, DO 12/26/2019, 12:03 PM   Pager: [phone]

## 2019-12-26 NOTE — Progress Notes (Signed)
Finger for Heparin Indication: chest pain/ACS  Allergies  Allergen Reactions  . Cefepime Other (See Comments)    Pt had BAD encephalopathy from Cefepime    Patient Measurements: Height: 6' (182.9 cm) Weight: 71.7 kg (158 lb) IBW/kg (Calculated) : 77.6  Heparin Dosing Weight: 75.3 kg  Vital Signs: Temp: 98.8 F (37.1 C) (06/18 2040) Temp Source: Oral (06/18 2040) BP: 150/75 (06/18 2040) Pulse Rate: 65 (06/18 2040)  Labs: Recent Labs     0000 12/24/19 0852 12/24/19 1133 12/24/19 2021 12/25/19 0519 12/25/19 1327 12/26/19 0007  HGB   < > 9.3*  --   --  9.1*  --  8.5*  HCT  --  29.7*  --   --  28.2*  --  26.5*  PLT  --  247  --   --  243  --  223  HEPARINUNFRC  --   --   --    < > 0.15* 0.27* 0.34  CREATININE  --  4.74*  --   --  4.98*  --  5.11*  TROPONINIHS  --  481* 1,921*  --   --   --   --    < > = values in this interval not displayed.    Estimated Creatinine Clearance: 14 mL/min (A) (by C-G formula based on SCr of 5.11 mg/dL (H)).   Assessment: 69 y.o. male with NSTEMI for heparin Goal of Therapy:  Heparin level 0.3-0.7 units/ml Monitor platelets by anticoagulation protocol: Yes   Plan:  Continue Heparin at current rate   Phillis Knack, PharmD, BCPS

## 2019-12-26 NOTE — Evaluation (Signed)
Physical Therapy Evaluation Patient Details Name: Dennis Macias MRN: [id number] DOB: 03-26-1951 Today's Date: 12/26/2019   History of Present Illness  Dennis Macias is a 69 y.o. male.  With a history of chronic kidney disease baseline serum creatinine 3.5 mg/dL.  History of CVA 2017 history of diabetes with nephrotic range proteinuria, history of urinary retention requiring urinary catheterization.  History of hyperlipidemia.  History of congestive heart failure ejection fraction of 35%.  Admission 11/25/2019-12/04/2019 debility and encephalopathy.Admission 11/06/2019-11/25/2019  Status post cardiac catheterization 11/19/2019 75 stenosis mid LAD 80% stenosis ostial LAD to proximal LAD.  Recommended medical therapy.  Complicated by Pseudomonas bacteremia and bacteriuria.  History of cefepime neurotoxicity.  Acute CVA in the parietal region.  None ST elevated MI with decompensated congestive heart failure.  Physical deconditioning Admission 10/14/2019 to 10/22/2019 with decompensated congestive heart failure.  Clinical Impression  Pt admitted with above diagnosis. Pt was able to ambulate with RW with good stability overall.  States he will have 24 hour care.  Should progress well with 24 hour care and [place] f/u.  No equipment needs.   Pt currently with functional limitations due to the deficits listed below (see PT Problem List). Pt will benefit from skilled PT to increase their independence and safety with mobility to allow discharge to the venue listed below.      Follow Up Recommendations Home health PT;Supervision/Assistance - 24 hour (HHOT)    Equipment Recommendations  None recommended by PT    Recommendations for Other Services       Precautions / Restrictions Precautions Precautions: Fall Restrictions Weight Bearing Restrictions: No      Mobility  Bed Mobility Overal bed mobility: Independent                Transfers Overall transfer level: Independent                   Ambulation/Gait Ambulation/Gait assistance: Min guard Gait Distance (Feet): 150 Feet Assistive device: Rolling walker (2 wheeled) Gait Pattern/deviations: Step-through pattern;Decreased stride length Gait velocity: decreased Gait velocity interpretation: <1.31 ft/sec, indicative of household ambulator General Gait Details: Pt was able to use RW and walk with min guard assit for safety. Pt states he is walking close to baseline.No LOB noted. Did desat to 87% but improved wtih pursed lip breathing.   Stairs            Wheelchair Mobility    Modified Rankin (Stroke Patients Only)       Balance Overall balance assessment: Needs assistance Sitting-balance support: No upper extremity supported;Feet supported Sitting balance-Leahy Scale: Fair     Standing balance support: Bilateral upper extremity supported;During functional activity Standing balance-Leahy Scale: Poor Standing balance comment: relies on UE support for balance                              Pertinent Vitals/Pain Pain Assessment: No/denies pain    Home Living Family/patient expects to be discharged to:: Private residence Living Arrangements: Spouse/significant other;Children Available Help at Discharge: Family;Available 24 hours/day (girlfriend and son) Type of Home: Apartment Home Access: Stairs to enter Entrance Stairs-Rails: Right Entrance Stairs-Number of Steps: 6 Home Layout: One level Home Equipment: Bedside commode;Wheelchair - manual;Cane - quad;Walker - 2 wheels;Walker - 4 wheels;Tub bench Additional Comments: girlfriend works but took leave of absence, his children live near by and can come help him PRN    Prior Function Level of Independence: Needs assistance  Gait / Transfers Assistance Needed: usually ambulates with walker or cane, limited community distances  ADL's / Homemaking Assistance Needed: independent with dressing, and sponge baths, girlfriend does cooking and  cleaning  Comments: enjoys going outside and sitting     Hand Dominance   Dominant Hand: Right    Extremity/Trunk Assessment   Upper Extremity Assessment Upper Extremity Assessment: Defer to OT evaluation    Lower Extremity Assessment Lower Extremity Assessment: Generalized weakness    Cervical / Trunk Assessment Cervical / Trunk Assessment: Normal  Communication   Communication: HOH  Cognition Arousal/Alertness: Awake/alert Behavior During Therapy: WFL for tasks assessed/performed Overall Cognitive Status: History of cognitive impairments - at baseline                                        General Comments      Exercises     Assessment/Plan    PT Assessment Patient needs continued PT services  PT Problem List Decreased activity tolerance;Decreased balance;Decreased mobility;Decreased knowledge of use of DME;Decreased safety awareness;Decreased knowledge of precautions       PT Treatment Interventions DME instruction;Gait training;Functional mobility training;Therapeutic activities;Therapeutic exercise;Balance training;Patient/family education    PT Goals (Current goals can be found in the Care Plan section)  Acute Rehab PT Goals Patient Stated Goal: to go home PT Goal Formulation: With patient Time For Goal Achievement: 01/09/20 Potential to Achieve Goals: Good    Frequency Min 3X/week   Barriers to discharge        Co-evaluation               AM-PAC PT "6 Clicks" Mobility  Outcome Measure Help needed turning from your back to your side while in a flat bed without using bedrails?: None Help needed moving from lying on your back to sitting on the side of a flat bed without using bedrails?: None Help needed moving to and from a bed to a chair (including a wheelchair)?: None Help needed standing up from a chair using your arms (e.g., wheelchair or bedside chair)?: A Little Help needed to walk in hospital room?: A Little Help  needed climbing 3-5 steps with a railing? : A Little 6 Click Score: 21    End of Session Equipment Utilized During Treatment: Gait belt Activity Tolerance: Patient limited by fatigue Patient left: in chair;with call bell/phone within reach;with chair alarm set Nurse Communication: Mobility status PT Visit Diagnosis: Muscle weakness (generalized) (M62.81)    Time: 2637-8588 PT Time Calculation (min) (ACUTE ONLY): 19 min   Charges:   PT Evaluation $PT Eval Moderate Complexity: 1 Mod          Kindrick Lankford W,PT Acute Rehabilitation Services Pager:  [phone]  Office:  [phone]    Denice Paradise 12/26/2019, 11:55 AM

## 2019-12-26 NOTE — Progress Notes (Signed)
   12/26/19 1100  PT Recommendation  Follow Up Recommendations Home health PT;Supervision/Assistance - 24 hour (HHOT)  PT equipment None recommended by PT  Pt will need 24 hour care which he states is lined up. Has equipment and should use RW at all times.  He will need HHOT and HHPT f/u as well.  Pt did desat to 87% on RA but with pursed lip breathing improved to 90%.  Will follow while in hospital.   [facility] W,PT [place] Pager:  [phone]  Office:  [phone]

## 2019-12-26 NOTE — Progress Notes (Signed)
PT is recommending HHPT/OT. Contacted Corey with [facility] and he reports that pt is active with them. Notified RN that pt is active with [name] and pt needs an order to continue [facility] services.

## 2019-12-27 LAB — URINE CULTURE: Culture: >=100000 — AB

## 2019-12-28 DIAGNOSIS — I2510 Atherosclerotic heart disease of native coronary artery without angina pectoris: Secondary | ICD-10-CM | POA: Diagnosis not present

## 2019-12-28 DIAGNOSIS — I0981 Rheumatic heart failure: Secondary | ICD-10-CM | POA: Diagnosis not present

## 2019-12-28 DIAGNOSIS — T361X5D Adverse effect of cephalosporins and other beta-lactam antibiotics, subsequent encounter: Secondary | ICD-10-CM | POA: Diagnosis not present

## 2019-12-28 DIAGNOSIS — I130 Hypertensive heart and chronic kidney disease with heart failure and stage 1 through stage 4 chronic kidney disease, or unspecified chronic kidney disease: Secondary | ICD-10-CM | POA: Diagnosis not present

## 2019-12-28 DIAGNOSIS — I252 Old myocardial infarction: Secondary | ICD-10-CM | POA: Diagnosis not present

## 2019-12-28 DIAGNOSIS — I5043 Acute on chronic combined systolic (congestive) and diastolic (congestive) heart failure: Secondary | ICD-10-CM | POA: Diagnosis not present

## 2019-12-28 DIAGNOSIS — I255 Ischemic cardiomyopathy: Secondary | ICD-10-CM | POA: Diagnosis not present

## 2019-12-28 DIAGNOSIS — E1122 Type 2 diabetes mellitus with diabetic chronic kidney disease: Secondary | ICD-10-CM | POA: Diagnosis not present

## 2019-12-28 DIAGNOSIS — G92 Toxic encephalopathy: Secondary | ICD-10-CM | POA: Diagnosis not present

## 2019-12-30 ENCOUNTER — Ambulatory Visit (INDEPENDENT_AMBULATORY_CARE_PROVIDER_SITE_OTHER): Payer: Medicare PPO

## 2019-12-30 ENCOUNTER — Ambulatory Visit: Payer: Medicare PPO

## 2019-12-30 ENCOUNTER — Ambulatory Visit: Payer: Medicare PPO | Admitting: *Deleted

## 2019-12-30 DIAGNOSIS — N184 Chronic kidney disease, stage 4 (severe): Secondary | ICD-10-CM

## 2019-12-30 DIAGNOSIS — I639 Cerebral infarction, unspecified: Secondary | ICD-10-CM | POA: Diagnosis not present

## 2019-12-30 DIAGNOSIS — R55 Syncope and collapse: Secondary | ICD-10-CM | POA: Diagnosis not present

## 2019-12-30 DIAGNOSIS — E1122 Type 2 diabetes mellitus with diabetic chronic kidney disease: Secondary | ICD-10-CM | POA: Diagnosis not present

## 2019-12-30 DIAGNOSIS — I10 Essential (primary) hypertension: Secondary | ICD-10-CM

## 2019-12-30 DIAGNOSIS — I5042 Chronic combined systolic (congestive) and diastolic (congestive) heart failure: Secondary | ICD-10-CM

## 2019-12-30 DIAGNOSIS — I255 Ischemic cardiomyopathy: Secondary | ICD-10-CM | POA: Diagnosis not present

## 2019-12-30 DIAGNOSIS — I4891 Unspecified atrial fibrillation: Secondary | ICD-10-CM

## 2019-12-30 DIAGNOSIS — T361X5D Adverse effect of cephalosporins and other beta-lactam antibiotics, subsequent encounter: Secondary | ICD-10-CM | POA: Diagnosis not present

## 2019-12-30 DIAGNOSIS — I5043 Acute on chronic combined systolic (congestive) and diastolic (congestive) heart failure: Secondary | ICD-10-CM | POA: Diagnosis not present

## 2019-12-30 DIAGNOSIS — I0981 Rheumatic heart failure: Secondary | ICD-10-CM | POA: Diagnosis not present

## 2019-12-30 DIAGNOSIS — I63412 Cerebral infarction due to embolism of left middle cerebral artery: Secondary | ICD-10-CM

## 2019-12-30 DIAGNOSIS — I2510 Atherosclerotic heart disease of native coronary artery without angina pectoris: Secondary | ICD-10-CM

## 2019-12-30 DIAGNOSIS — I130 Hypertensive heart and chronic kidney disease with heart failure and stage 1 through stage 4 chronic kidney disease, or unspecified chronic kidney disease: Secondary | ICD-10-CM | POA: Diagnosis not present

## 2019-12-30 DIAGNOSIS — G92 Toxic encephalopathy: Secondary | ICD-10-CM | POA: Diagnosis not present

## 2019-12-30 DIAGNOSIS — I252 Old myocardial infarction: Secondary | ICD-10-CM | POA: Diagnosis not present

## 2019-12-30 LAB — CULTURE, BLOOD (ROUTINE X 2)
Culture: NO GROWTH
Culture: NO GROWTH

## 2019-12-30 NOTE — Chronic Care Management (AMB) (Signed)
Chronic Care Management   Initial Visit Note  12/30/2019 Name: Dennis Macias MRN: [id number] DOB: 08-Oct-1950  Referred by: [name], MD Reason for referral : Chronic Care Management (DM, HF, HLD, CKD, HF, CAD)   Dennis Macias is a 69 y.o. year old male who is a primary care patient of [name], Rylee, MD. The CCM team was consulted for assistance with chronic disease management and care coordination needs related to CHF, CAD, HTN, HLD, DMII and CKD Stage 3  Review of patient status, including review of consultants reports, relevant laboratory and other test results, and collaboration with appropriate care team members and the patient's provider was performed as part of comprehensive patient evaluation and provision of chronic care management services.    SDOH (Social Determinants of Health) assessments performed: No See Care Plan activities for detailed interventions related to SDOH     Medications: Outpatient Encounter Medications as of 12/30/2019  Medication Sig  . Accu-Chek FastClix Lancets MISC Check blood sugar up to 7 times a week as instructed  . acetaminophen (TYLENOL) 325 MG tablet Take 1-2 tablets (325-650 mg total) by mouth every 4 (four) hours as needed for mild pain.  Marland Kitchen amLODipine (NORVASC) 10 MG tablet Take 1 tablet (10 mg total) by mouth daily.  Marland Kitchen amLODipine (NORVASC) 10 MG tablet TAKE 1 TABLET(10 MG) BY MOUTH DAILY  . aspirin EC 81 MG tablet Take 1 tablet (81 mg total) by mouth daily.  Marland Kitchen atorvastatin (LIPITOR) 80 MG tablet Take 1 tablet (80 mg total) by mouth at bedtime. IM program  . Blood Glucose Monitoring Suppl (ACCU-CHEK GUIDE) w/Device KIT 1 each by Does not apply route daily. Check blood sugar as instructed up to 7 times a week  . carvedilol (COREG) 25 MG tablet Take 1 tablet (25 mg total) by mouth 2 (two) times daily with a meal.  . ciprofloxacin (CIPRO) 250 MG tablet Take 2 tablets (500 mg total) by mouth daily with breakfast.  . clopidogrel (PLAVIX) 75 MG  tablet Take 1 tablet (75 mg total) by mouth daily.  . dorzolamide-timolol (COSOPT) 22.3-6.8 MG/ML ophthalmic solution Place 1 drop into the right eye 2 (two) times daily.  . finasteride (PROSCAR) 5 MG tablet Take 1 tablet (5 mg total) by mouth daily.  . furosemide (LASIX) 80 MG tablet Take 1 tablet (80 mg total) by mouth 2 (two) times daily.  Marland Kitchen glucose blood (ACCU-CHEK GUIDE) test strip Check blood sugar up to 7 times a week as instructed  . hydrALAZINE (APRESOLINE) 50 MG tablet Take 1 tablet (50 mg total) by mouth every 8 (eight) hours.  . hydrALAZINE (APRESOLINE) 50 MG tablet TAKE 1 TABLET(50 MG) BY MOUTH THREE TIMES DAILY  . isosorbide mononitrate (IMDUR) 60 MG 24 hr tablet Take 1 tablet (60 mg total) by mouth daily.  . melatonin 3 MG TABS tablet Take 1 tablet (3 mg total) by mouth at bedtime.  . tamsulosin (FLOMAX) 0.4 MG CAPS capsule Take 1 capsule (0.4 mg total) by mouth daily after supper.  . vitamin B-12 (CYANOCOBALAMIN) 1000 MCG tablet Take 1 tablet (1,000 mcg total) by mouth daily.   No facility-administered encounter medications on file as of 12/30/2019.     Objective:  Lab Results  Component Value Date   HGBA1C 5.9 (H) 11/14/2019   HGBA1C 5.2 10/13/2019   HGBA1C 5.3 06/15/2019   Lab Results  Component Value Date   LDLCALC 48 12/25/2019   CREATININE 5.11 (H) 12/26/2019   Wt Readings from Last 3 Encounters:  12/26/19  157 lb 3.2 oz (71.3 kg)  12/14/19 170 lb (77.1 kg)  12/10/19 174 lb 8 oz (79.2 kg)   BP Readings from Last 3 Encounters:  12/26/19 120/60  12/23/19 (!) 160/76  12/21/19 (!) 149/73   12/24/19 Results of echocardiogram Left ventricular ejection fraction, by estimation, is 35 to 40%. The left ventricle has moderately decreased function. The left ventricle demonstrates global hypokinesis. The left ventricular internal cavity size was mildly dilated. There is mild left ventricular hypertrophy. Left ventricular diastolic parameters are consistent with Grade II  diastolic dysfunction (pseudonormalization). 2. Right ventricular systolic function is normal. The right ventricular size is normal. There is moderately elevated pulmonary artery systolic pressure. 3. Left atrial size was moderately dilated. 4. Moderate pleural effusion. 5. The mitral valve is normal in structure. Moderate mitral valve regurgitation   Goals Addressed              This Visit's Progress     Patient Stated   .  "I told the social worker I want to apply for Medicaid so I can get help in the home." (pt-stated)        [place] (see longitudinal plan of care for additional care plan information)   Current Barriers:  . Chronic Disease Management support, education, and care coordination needs related to CHF, CAD, HTN, HLD, DMII, and CKD Stage 3  Case Manager Clinical Goal(s):  Marland Kitchen Over the next 90 days, patient will work with BSW to address needs related to  applying for Medicaid  in patient with CHF, CAD, HTN, HLD, DMII, and CKD Stage 3  Interventions:  . Collaborated with BSW to initiate plan of care to address needs related to  applying for Medicaid  in patient with CHF, CAD, HTN, HLD, DMII, and CKD Stage 3  Patient Self Care Activities:  . Patient verbalizes understanding of plan to work with BSW in applying for Medicaid  Initial goal documentation         [name] was given information about Chronic Care Management services today including:  1. CCM service includes personalized support from designated clinical staff supervised by his physician, including individualized plan of care and coordination with other care providers 2. 24/7 contact phone numbers for assistance for urgent and routine care needs. 3. Service will only be billed when office clinical staff spend 20 minutes or more in a month to coordinate care. 4. Only one practitioner may furnish and bill the service in a calendar month. 5. The patient may stop CCM services at any time (effective  at the end of the month) by phone call to the office staff. 6. The patient will be responsible for cost sharing (co-pay) of up to 20% of the service fee (after annual deductible is met).  Patient agreed to services and verbal consent obtained.   Plan:   The care management team will reach out to the patient again over the next 90 days.   Kelli Churn RN, CCM, [facility] RN Care Manager [phone]

## 2019-12-30 NOTE — Chronic Care Management (AMB) (Signed)
Chronic Care Management    Clinical Social Work General Note  12/30/2019 Name: Dennis Macias MRN: [id number] DOB: 1950-10-08  Dennis Macias is a 69 y.o. year old male who is a primary care patient of [name], Rylee, MD. The CCM was consulted to assist the patient with in-home services.   Mr. Horn was given information about Chronic Care Management services today including:  1. CCM service includes personalized support from designated clinical staff supervised by his physician, including individualized plan of care and coordination with other care providers 2. 24/7 contact phone numbers for assistance for urgent and routine care needs. 3. Service will only be billed when office clinical staff spend 20 minutes or more in a month to coordinate care. 4. Only one practitioner may furnish and bill the service in a calendar month. 5. The patient may stop CCM services at any time (effective at the end of the month) by phone call to the office staff. 6. The patient will be responsible for cost sharing (co-pay) of up to 20% of the service fee (after annual deductible is met).  Patient agreed to services and verbal consent obtained.   Review of patient status, including review of consultants reports, relevant laboratory and other test results, and collaboration with appropriate care team members and the patient's provider was performed as part of comprehensive patient evaluation and provision of chronic care management services.    SDOH (Social Determinants of Health) assessments and interventions performed:  No    Outpatient Encounter Medications as of 12/30/2019  Medication Sig  . Accu-Chek FastClix Lancets MISC Check blood sugar up to 7 times a week as instructed  . acetaminophen (TYLENOL) 325 MG tablet Take 1-2 tablets (325-650 mg total) by mouth every 4 (four) hours as needed for mild pain.  Marland Kitchen amLODipine (NORVASC) 10 MG tablet Take 1 tablet (10 mg total) by mouth daily.  Marland Kitchen amLODipine (NORVASC) 10  MG tablet TAKE 1 TABLET(10 MG) BY MOUTH DAILY  . aspirin EC 81 MG tablet Take 1 tablet (81 mg total) by mouth daily.  Marland Kitchen atorvastatin (LIPITOR) 80 MG tablet Take 1 tablet (80 mg total) by mouth at bedtime. IM program  . Blood Glucose Monitoring Suppl (ACCU-CHEK GUIDE) w/Device KIT 1 each by Does not apply route daily. Check blood sugar as instructed up to 7 times a week  . carvedilol (COREG) 25 MG tablet Take 1 tablet (25 mg total) by mouth 2 (two) times daily with a meal.  . ciprofloxacin (CIPRO) 250 MG tablet Take 2 tablets (500 mg total) by mouth daily with breakfast.  . clopidogrel (PLAVIX) 75 MG tablet Take 1 tablet (75 mg total) by mouth daily.  . dorzolamide-timolol (COSOPT) 22.3-6.8 MG/ML ophthalmic solution Place 1 drop into the right eye 2 (two) times daily.  . finasteride (PROSCAR) 5 MG tablet Take 1 tablet (5 mg total) by mouth daily.  . furosemide (LASIX) 80 MG tablet Take 1 tablet (80 mg total) by mouth 2 (two) times daily.  Marland Kitchen glucose blood (ACCU-CHEK GUIDE) test strip Check blood sugar up to 7 times a week as instructed  . hydrALAZINE (APRESOLINE) 50 MG tablet Take 1 tablet (50 mg total) by mouth every 8 (eight) hours.  . hydrALAZINE (APRESOLINE) 50 MG tablet TAKE 1 TABLET(50 MG) BY MOUTH THREE TIMES DAILY  . isosorbide mononitrate (IMDUR) 60 MG 24 hr tablet Take 1 tablet (60 mg total) by mouth daily.  . melatonin 3 MG TABS tablet Take 1 tablet (3 mg total) by mouth at bedtime.  Marland Kitchen  tamsulosin (FLOMAX) 0.4 MG CAPS capsule Take 1 capsule (0.4 mg total) by mouth daily after supper.  . vitamin B-12 (CYANOCOBALAMIN) 1000 MCG tablet Take 1 tablet (1,000 mcg total) by mouth daily.   No facility-administered encounter medications on file as of 12/30/2019.    Goals Addressed              This Visit's Progress   .  "I want to apply for Medicaid so that I can get help at home if approved" (pt-stated)        Current Barriers:  Marland Kitchen Knowledge Barriers related to resources available for in  home assistance.  Met with patient and spouse during last clinic appointment on 12/10/19 to discuss options for in home aide services.  Explained that PCS is only covered by Medicaid.  Spouse stated that patient has previously applied for Medicaid but did not qualify.  Declined CCM services at that time.  Patient now seeking assistance with Medicaid application so that he may be able to receive in home assistance if approved.    Case Manager Clinical Goal(s):  Marland Kitchen Over the next 90 days, patient will work with BSW to address needs related to applying for Medicaid . Over the next 90 days, BSW will collaborate with RN Care Manager to address care management and care coordination needs  Interventions:  . Patient interviewed and appropriate assessments performed . Collaborated with care guide team regarding assistance with Medicaid application . Collaborated with RN Care Manager and patient to establish an individualized plan of care   Patient Self Care Activities:  . Patient verbalizes understanding of plan to work with Care Guide to apply for Medicaid . Attends all scheduled provider appointments  Initial goal documentation         Follow Up Plan: SW will follow up with patient by phone over the next 30 days          [place], [place] [phone]

## 2019-12-30 NOTE — Patient Instructions (Signed)
Visit Information  Goals Addressed              This Visit's Progress     Patient Stated   .  "I told the social worker I want to apply for Medicaid so I can get help in the home." (pt-stated)        [place] (see longitudinal plan of care for additional care plan information)   Current Barriers:  . Chronic Disease Management support, education, and care coordination needs related to CHF, CAD, HTN, HLD, DMII, and CKD Stage 3  Case Manager Clinical Goal(s):  Marland Kitchen Over the next 90 days, patient will work with BSW to address needs related to  applying for Medicaid  in patient with CHF, CAD, HTN, HLD, DMII, and CKD Stage 3  Interventions:  . Collaborated with BSW to initiate plan of care to address needs related to  applying for Medicaid  in patient with CHF, CAD, HTN, HLD, DMII, and CKD Stage 3  Patient Self Care Activities:  . Patient verbalizes understanding of plan to work with BSW in applying for Medicaid  Initial goal documentation        Dennis Macias was given information about Chronic Care Management services today including:  1. CCM service includes personalized support from designated clinical staff supervised by his physician, including individualized plan of care and coordination with other care providers 2. 24/7 contact phone numbers for assistance for urgent and routine care needs. 3. Service will only be billed when office clinical staff spend 20 minutes or more in a month to coordinate care. 4. Only one practitioner may furnish and bill the service in a calendar month. 5. The patient may stop CCM services at any time (effective at the end of the month) by phone call to the office staff. 6. The patient will be responsible for cost sharing (co-pay) of up to 20% of the service fee (after annual deductible is met).  Patient agreed to services and verbal consent obtained.   The patient verbalized understanding of instructions provided today and declined a print copy  of patient instruction materials.   The care management team will reach out to the patient again over the next 90 days.   Kelli Churn RN, CCM, [facility] RN Care Manager [phone]

## 2019-12-30 NOTE — Patient Instructions (Signed)
Licensed Clinical Social Worker Visit Information  Goals we discussed today:  Goals Addressed              This Visit's Progress   .  "I want to apply for Medicaid so that I can get help at home if approved" (pt-stated)        Current Barriers:  Marland Kitchen Knowledge Barriers related to resources available for in home assistance.  Met with patient and spouse during last clinic appointment on 12/10/19 to discuss options for in home aide services.  Explained that PCS is only covered by Medicaid.  Spouse stated that patient has previously applied for Medicaid but did not qualify.  Declined CCM services at that time.  Patient now seeking assistance with Medicaid application so that he may be able to receive in home assistance if approved.    Case Manager Clinical Goal(s):  Marland Kitchen Over the next 90 days, patient will work with BSW to address needs related to applying for Medicaid . Over the next 90 days, BSW will collaborate with RN Care Manager to address care management and care coordination needs  Interventions:  . Patient interviewed and appropriate assessments performed . Collaborated with care guide team regarding assistance with Medicaid application . Collaborated with RN Care Manager and patient to establish an individualized plan of care   Patient Self Care Activities:  . Patient verbalizes understanding of plan to work with Care Guide to apply for Medicaid . Attends all scheduled provider appointments  Initial goal documentation         Materials provided: Verbal education about applying for Medicaid provided by phone  Mr. Kley was given information about Chronic Care Management services today including:  1. CCM service includes personalized support from designated clinical staff supervised by his physician, including individualized plan of care and coordination with other care providers 2. 24/7 contact phone numbers for assistance for urgent and routine care needs. 3. Service will only be  billed when office clinical staff spend 20 minutes or more in a month to coordinate care. 4. Only one practitioner may furnish and bill the service in a calendar month. 5. The patient may stop CCM services at any time (effective at the end of the month) by phone call to the office staff. 6. The patient will be responsible for cost sharing (co-pay) of up to 20% of the service fee (after annual deductible is met).  Patient agreed to services and verbal consent obtained.   Patient verbalizes understanding of instructions provided today.   Follow up plan: SW will follow up with patient by phone over the next 30 days       [place], [place] Worker [place] [phone]

## 2019-12-30 NOTE — Progress Notes (Signed)
Internal Medicine Clinic Resident  I have personally reviewed this encounter including the documentation in this note and/or discussed this patient with the care management provider. I will address any urgent items identified by the care management provider and will communicate my actions to the patient's PCP. I have reviewed the patient's CCM visit with my supervising attending, Dr Philipp Ovens.  Jose Persia, MD 12/30/2019

## 2019-12-31 MED FILL — FINASTERIDE 5 MG TABLET: 5 | 30 days supply | Qty: 30 | Fill #3

## 2019-12-31 MED FILL — CLOPIDOGREL 75 MG TABLET: 75 | 30 days supply | Qty: 30 | Fill #1

## 2019-12-31 MED FILL — ISOSORBIDE MN ER 60 MG TAB: 60 | 30 days supply | Qty: 30 | Fill #0

## 2020-01-01 NOTE — Progress Notes (Signed)
Internal Medicine Clinic Attending  CCM services provided by the care management provider and their documentation were discussed with Dr. Basaraba. We reviewed the pertinent findings, urgent action items addressed by the resident and non-urgent items to be addressed by the PCP.  I agree with the assessment, diagnosis, and plan of care documented in the CCM and resident's note.  Tramar Brueckner, MD 01/01/2020 

## 2020-01-01 NOTE — Progress Notes (Signed)
Internal Medicine Clinic Resident  I have personally reviewed this encounter including the documentation in this note and/or discussed this patient with the care management provider. I will address any urgent items identified by the care management provider and will communicate my actions to the patient's PCP. I have reviewed the patient's CCM visit with my supervising attending, Dr Raines.  Rodgerick Gilliand, MD 01/01/2020    

## 2020-01-01 NOTE — Progress Notes (Signed)
Internal Medicine Clinic Attending  CCM services provided by the care management provider and their documentation were reviewed with Dr. Basaraba.  We reviewed the pertinent findings, urgent action items addressed by the resident and non-urgent items to be addressed by the PCP.  I agree with the assessment, diagnosis, and plan of care documented in the CCM and resident's note.  Alexander N Raines, MD 01/01/2020  

## 2020-01-04 ENCOUNTER — Other Ambulatory Visit: Payer: Self-pay | Admitting: *Deleted

## 2020-01-04 ENCOUNTER — Other Ambulatory Visit: Payer: Self-pay

## 2020-01-04 DIAGNOSIS — I5043 Acute on chronic combined systolic (congestive) and diastolic (congestive) heart failure: Secondary | ICD-10-CM | POA: Diagnosis not present

## 2020-01-04 DIAGNOSIS — E1122 Type 2 diabetes mellitus with diabetic chronic kidney disease: Secondary | ICD-10-CM | POA: Diagnosis not present

## 2020-01-04 DIAGNOSIS — I2510 Atherosclerotic heart disease of native coronary artery without angina pectoris: Secondary | ICD-10-CM | POA: Diagnosis not present

## 2020-01-04 DIAGNOSIS — G92 Toxic encephalopathy: Secondary | ICD-10-CM | POA: Diagnosis not present

## 2020-01-04 DIAGNOSIS — T361X5D Adverse effect of cephalosporins and other beta-lactam antibiotics, subsequent encounter: Secondary | ICD-10-CM | POA: Diagnosis not present

## 2020-01-04 DIAGNOSIS — I252 Old myocardial infarction: Secondary | ICD-10-CM | POA: Diagnosis not present

## 2020-01-04 DIAGNOSIS — I130 Hypertensive heart and chronic kidney disease with heart failure and stage 1 through stage 4 chronic kidney disease, or unspecified chronic kidney disease: Secondary | ICD-10-CM | POA: Diagnosis not present

## 2020-01-04 DIAGNOSIS — I255 Ischemic cardiomyopathy: Secondary | ICD-10-CM | POA: Diagnosis not present

## 2020-01-04 DIAGNOSIS — I0981 Rheumatic heart failure: Secondary | ICD-10-CM | POA: Diagnosis not present

## 2020-01-04 MED ORDER — CARVEDILOL 25 MG PO TABS: 25.0000 mg | ORAL_TABLET | Freq: Two times a day (BID) | ORAL | 1 refills | Status: DC

## 2020-01-04 MED FILL — CARVEDILOL 25 MG TABLET: 25 | 90 days supply | Qty: 180 | Fill #0

## 2020-01-04 NOTE — Telephone Encounter (Signed)
Requesting to speak with a nurse about meds, please call pt back.  

## 2020-01-05 NOTE — Telephone Encounter (Signed)
This was addressed in a new note

## 2020-01-07 ENCOUNTER — Ambulatory Visit: Payer: Self-pay

## 2020-01-07 DIAGNOSIS — I10 Essential (primary) hypertension: Secondary | ICD-10-CM

## 2020-01-07 DIAGNOSIS — I0981 Rheumatic heart failure: Secondary | ICD-10-CM | POA: Diagnosis not present

## 2020-01-07 DIAGNOSIS — I130 Hypertensive heart and chronic kidney disease with heart failure and stage 1 through stage 4 chronic kidney disease, or unspecified chronic kidney disease: Secondary | ICD-10-CM | POA: Diagnosis not present

## 2020-01-07 DIAGNOSIS — N184 Chronic kidney disease, stage 4 (severe): Secondary | ICD-10-CM

## 2020-01-07 DIAGNOSIS — I5043 Acute on chronic combined systolic (congestive) and diastolic (congestive) heart failure: Secondary | ICD-10-CM | POA: Diagnosis not present

## 2020-01-07 DIAGNOSIS — I5042 Chronic combined systolic (congestive) and diastolic (congestive) heart failure: Secondary | ICD-10-CM

## 2020-01-07 DIAGNOSIS — G92 Toxic encephalopathy: Secondary | ICD-10-CM | POA: Diagnosis not present

## 2020-01-07 DIAGNOSIS — I252 Old myocardial infarction: Secondary | ICD-10-CM | POA: Diagnosis not present

## 2020-01-07 DIAGNOSIS — I255 Ischemic cardiomyopathy: Secondary | ICD-10-CM | POA: Diagnosis not present

## 2020-01-07 DIAGNOSIS — T361X5D Adverse effect of cephalosporins and other beta-lactam antibiotics, subsequent encounter: Secondary | ICD-10-CM | POA: Diagnosis not present

## 2020-01-07 DIAGNOSIS — I2510 Atherosclerotic heart disease of native coronary artery without angina pectoris: Secondary | ICD-10-CM | POA: Diagnosis not present

## 2020-01-07 DIAGNOSIS — E1122 Type 2 diabetes mellitus with diabetic chronic kidney disease: Secondary | ICD-10-CM | POA: Diagnosis not present

## 2020-01-07 NOTE — Chronic Care Management (AMB) (Signed)
  Care Management   Follow Up Note   01/07/2020 Name: Dennis Macias MRN: [id number] DOB: 06-May-1951  Referred by: [name], MD Reason for referral : Care Coordination (Medicaid application )   Dennis Macias is a 69 y.o. year old male who is a primary care patient of [name], [name], MD. The care management team was consulted for assistance with care management and care coordination needs.    Review of patient status, including review of consultants reports, relevant laboratory and other test results, and collaboration with appropriate care team members and the patient's provider was performed as part of comprehensive patient evaluation and provision of chronic care management services.    SDOH (Social Determinants of Health) assessments performed: No See Care Plan activities for detailed interventions related to [facility])     Advanced Directives: See Care Plan and [employer] application for related entries.   Goals Addressed              This Visit's Progress   .  "I want to apply for Medicaid so that I can get help at home if approved" (pt-stated)        Current Barriers:  Marland Kitchen Knowledge Barriers related to resources available for in home assistance.  Met with patient and spouse during last clinic appointment on 12/10/19 to discuss options for in home aide services.  Explained that PCS is only covered by Medicaid.  Spouse stated that patient has previously applied for Medicaid but did not qualify.  Declined CCM services at that time.  Patient now seeking assistance with Medicaid application so that he may be able to receive in home assistance if approved.    Case Manager Clinical Goal(s):  Marland Kitchen Over the next 90 days, patient will work with BSW to address needs related to applying for Medicaid . Over the next 90 days, BSW will collaborate with RN Care Manager to address care management and care coordination needs  Interventions:  . Received voicemail messages from spouse on 01/06/20 and 01/07/20  stating that patient has not yet been contacted by [facility] for assistance with Medicaid application  . Messages sent both days to C3 team regarding status of referral; received response that patient will be contacted on 01/08/20.    Patient Self Care Activities:  . Patient verbalizes understanding of plan to work with Care Guide to apply for Medicaid . Attends all scheduled provider appointments  Please see past updates related to this goal by clicking on the "Past Updates" button in the selected goal          The patient has been provided with contact information for the care management team and has been advised to call with any health related questions or concerns.      Ronn Melena, [place] Coordination Social Worker [place] [phone]

## 2020-01-07 NOTE — Patient Instructions (Signed)
Visit Information  Goals Addressed              This Visit's Progress   .  "I want to apply for Medicaid so that I can get help at home if approved" (pt-stated)        Current Barriers:  Marland Kitchen Knowledge Barriers related to resources available for in home assistance.  Met with patient and spouse during last clinic appointment on 12/10/19 to discuss options for in home aide services.  Explained that PCS is only covered by Medicaid.  Spouse stated that patient has previously applied for Medicaid but did not qualify.  Declined CCM services at that time.  Patient now seeking assistance with Medicaid application so that he may be able to receive in home assistance if approved.    Case Manager Clinical Goal(s):  Marland Kitchen Over the next 90 days, patient will work with BSW to address needs related to applying for Medicaid . Over the next 90 days, BSW will collaborate with RN Care Manager to address care management and care coordination needs  Interventions:  . Received voicemail messages from spouse on 01/06/20 and 01/07/20 stating that patient has not yet been contacted by [facility] for assistance with Medicaid application  . Messages sent both days to C3 team regarding status of referral; received response that patient will be contacted on 01/08/20.    Patient Self Care Activities:  . Patient verbalizes understanding of plan to work with Care Guide to apply for Medicaid . Attends all scheduled provider appointments  Please see past updates related to this goal by clicking on the "Past Updates" button in the selected goal           The patient has been provided with contact information for the care management team and has been advised to call with any health related questions or concerns.     Ronn Melena, [place] Coordination Social Worker [place] [phone]

## 2020-01-08 ENCOUNTER — Telehealth: Payer: Self-pay | Admitting: Internal Medicine

## 2020-01-08 NOTE — Progress Notes (Signed)
Internal Medicine Clinic Resident  I have personally reviewed this encounter including the documentation in this note and/or discussed this patient with the care management provider. I will address any urgent items identified by the care management provider and will communicate my actions to the patient's PCP. I have reviewed the patient's CCM visit with my supervising attending, Dr Narendra.  Dayle Mcnerney, MD  IM PGY-2 01/08/2020    

## 2020-01-08 NOTE — Telephone Encounter (Signed)
   [facility] 01/08/2020   Name: Dennis Macias   MRN: [id number]   DOB: [birth year]   AGE: 69 y.o.   GENDER: male   PCP [name], MD.   Called pt regarding Community Resource Referral for Medicaid assistance. Informed [name] that per [facility] DSS he can give them a call at [phone] to provide his information and once the caseworker receives his information he will receive a call back. [name] stated understanding and that he will give DSS a call today to start his application process.    Closing referral pending any other needs of patient.    [place], Care Management Phone: [phone] Email: [email or web address]

## 2020-01-08 NOTE — Telephone Encounter (Signed)
Spoke with representative at [employer] regarding Medicaid applications. Representative stated that individuals can give them a call at [phone] and provide their information and they will be assigned a caseworker. Once the caseworker receives their information the individual will receive a call back. Care Guide will give this information to the patient.

## 2020-01-09 NOTE — Telephone Encounter (Signed)
I agree

## 2020-01-11 DIAGNOSIS — G92 Toxic encephalopathy: Secondary | ICD-10-CM | POA: Diagnosis not present

## 2020-01-11 DIAGNOSIS — I255 Ischemic cardiomyopathy: Secondary | ICD-10-CM | POA: Diagnosis not present

## 2020-01-11 DIAGNOSIS — I5043 Acute on chronic combined systolic (congestive) and diastolic (congestive) heart failure: Secondary | ICD-10-CM | POA: Diagnosis not present

## 2020-01-11 DIAGNOSIS — I252 Old myocardial infarction: Secondary | ICD-10-CM | POA: Diagnosis not present

## 2020-01-11 DIAGNOSIS — T361X5D Adverse effect of cephalosporins and other beta-lactam antibiotics, subsequent encounter: Secondary | ICD-10-CM | POA: Diagnosis not present

## 2020-01-11 DIAGNOSIS — E1122 Type 2 diabetes mellitus with diabetic chronic kidney disease: Secondary | ICD-10-CM | POA: Diagnosis not present

## 2020-01-11 DIAGNOSIS — I130 Hypertensive heart and chronic kidney disease with heart failure and stage 1 through stage 4 chronic kidney disease, or unspecified chronic kidney disease: Secondary | ICD-10-CM | POA: Diagnosis not present

## 2020-01-11 DIAGNOSIS — I2510 Atherosclerotic heart disease of native coronary artery without angina pectoris: Secondary | ICD-10-CM | POA: Diagnosis not present

## 2020-01-11 DIAGNOSIS — I0981 Rheumatic heart failure: Secondary | ICD-10-CM | POA: Diagnosis not present

## 2020-01-12 ENCOUNTER — Encounter (HOSPITAL_COMMUNITY): Payer: Self-pay | Admitting: Emergency Medicine

## 2020-01-12 ENCOUNTER — Inpatient Hospital Stay (HOSPITAL_COMMUNITY)
Admission: EM | Admit: 2020-01-12 | Discharge: 2020-01-21 | DRG: 264 | Disposition: A | Discharge status: Home / Self Care | Payer: Medicare PPO | Attending: Internal Medicine | Admitting: Internal Medicine

## 2020-01-12 ENCOUNTER — Other Ambulatory Visit: Payer: Self-pay

## 2020-01-12 ENCOUNTER — Encounter (INDEPENDENT_AMBULATORY_CARE_PROVIDER_SITE_OTHER): Payer: Medicare PPO | Admitting: Ophthalmology

## 2020-01-12 ENCOUNTER — Telehealth: Payer: Self-pay | Admitting: Internal Medicine

## 2020-01-12 DIAGNOSIS — N179 Acute kidney failure, unspecified: Secondary | ICD-10-CM | POA: Diagnosis present

## 2020-01-12 DIAGNOSIS — Z83438 Family history of other disorder of lipoprotein metabolism and other lipidemia: Secondary | ICD-10-CM

## 2020-01-12 DIAGNOSIS — Z7902 Long term (current) use of antithrombotics/antiplatelets: Secondary | ICD-10-CM

## 2020-01-12 DIAGNOSIS — D649 Anemia, unspecified: Secondary | ICD-10-CM

## 2020-01-12 DIAGNOSIS — I252 Old myocardial infarction: Secondary | ICD-10-CM

## 2020-01-12 DIAGNOSIS — Z8673 Personal history of transient ischemic attack (TIA), and cerebral infarction without residual deficits: Secondary | ICD-10-CM

## 2020-01-12 DIAGNOSIS — Z20822 Contact with and (suspected) exposure to covid-19: Secondary | ICD-10-CM | POA: Diagnosis present

## 2020-01-12 DIAGNOSIS — E785 Hyperlipidemia, unspecified: Secondary | ICD-10-CM | POA: Diagnosis present

## 2020-01-12 DIAGNOSIS — N185 Chronic kidney disease, stage 5: Secondary | ICD-10-CM

## 2020-01-12 DIAGNOSIS — I255 Ischemic cardiomyopathy: Secondary | ICD-10-CM | POA: Diagnosis present

## 2020-01-12 DIAGNOSIS — R627 Adult failure to thrive: Secondary | ICD-10-CM | POA: Diagnosis present

## 2020-01-12 DIAGNOSIS — Z992 Dependence on renal dialysis: Secondary | ICD-10-CM

## 2020-01-12 DIAGNOSIS — E1122 Type 2 diabetes mellitus with diabetic chronic kidney disease: Secondary | ICD-10-CM | POA: Diagnosis present

## 2020-01-12 DIAGNOSIS — Z7982 Long term (current) use of aspirin: Secondary | ICD-10-CM

## 2020-01-12 DIAGNOSIS — E872 Acidosis: Secondary | ICD-10-CM | POA: Diagnosis present

## 2020-01-12 DIAGNOSIS — I5043 Acute on chronic combined systolic (congestive) and diastolic (congestive) heart failure: Secondary | ICD-10-CM | POA: Diagnosis present

## 2020-01-12 DIAGNOSIS — E875 Hyperkalemia: Secondary | ICD-10-CM

## 2020-01-12 DIAGNOSIS — I132 Hypertensive heart and chronic kidney disease with heart failure and with stage 5 chronic kidney disease, or end stage renal disease: Principal | ICD-10-CM | POA: Diagnosis present

## 2020-01-12 DIAGNOSIS — N186 End stage renal disease: Secondary | ICD-10-CM

## 2020-01-12 DIAGNOSIS — Z8249 Family history of ischemic heart disease and other diseases of the circulatory system: Secondary | ICD-10-CM

## 2020-01-12 DIAGNOSIS — Z79899 Other long term (current) drug therapy: Secondary | ICD-10-CM

## 2020-01-12 DIAGNOSIS — Z7189 Other specified counseling: Secondary | ICD-10-CM

## 2020-01-12 DIAGNOSIS — N189 Chronic kidney disease, unspecified: Secondary | ICD-10-CM | POA: Diagnosis present

## 2020-01-12 DIAGNOSIS — R61 Generalized hyperhidrosis: Secondary | ICD-10-CM | POA: Diagnosis present

## 2020-01-12 DIAGNOSIS — I2510 Atherosclerotic heart disease of native coronary artery without angina pectoris: Secondary | ICD-10-CM | POA: Diagnosis present

## 2020-01-12 DIAGNOSIS — Z87891 Personal history of nicotine dependence: Secondary | ICD-10-CM

## 2020-01-12 DIAGNOSIS — D631 Anemia in chronic kidney disease: Secondary | ICD-10-CM | POA: Diagnosis present

## 2020-01-12 LAB — CBC
HCT: 23.8 % — ABNORMAL LOW (ref 39.0–52.0)
Hemoglobin: 7.5 g/dL — ABNORMAL LOW (ref 13.0–17.0)
MCH: 29.5 pg (ref 26.0–34.0)
MCHC: 31.5 g/dL (ref 30.0–36.0)
MCV: 93.7 fL (ref 80.0–100.0)
Platelets: 148 10*3/uL — ABNORMAL LOW (ref 150–400)
RBC: 2.54 MIL/uL — ABNORMAL LOW (ref 4.22–5.81)
RDW: 16.6 % — ABNORMAL HIGH (ref 11.5–15.5)
WBC: 5.2 10*3/uL (ref 4.0–10.5)
nRBC: 0 % (ref 0.0–0.2)

## 2020-01-12 MED ORDER — SODIUM CHLORIDE 0.9% FLUSH
3.0000 mL | Freq: Once | INTRAVENOUS | Status: AC
  Administered 2020-01-13: 3 mL via INTRAVENOUS

## 2020-01-12 NOTE — Telephone Encounter (Signed)
I called patient after got paged on oncall pager. No answer x 2. Tried again and I was able to talk to her stepdaughter. She mentions that they also called 911 and EMS is here and taking care of Mr. Witty.  He is in the ambulance and plan is to bring him to the hospital per stepdaughter.  She mentions that Mr. Petrosino had shortness of breath and difficulty breathing and that is why they called EMS. I asked if there is anything I can do meanwhile and asks the doctors call her mother for update when patient admitted.  [name] MD IM PGY-3 Pager [phone]

## 2020-01-12 NOTE — ED Triage Notes (Signed)
Pt BIB [facility] from home, c/o shortness of breath that worsened tonight. Hx of CHF, denies chest pain or weight gain, pt reports his abdomen is distended and feels tight. Hx CKD, no dialysis, also reports he has been taking his lasix as prescribed.

## 2020-01-13 ENCOUNTER — Ambulatory Visit: Payer: Self-pay | Admitting: *Deleted

## 2020-01-13 ENCOUNTER — Emergency Department (HOSPITAL_COMMUNITY): Payer: Medicare PPO

## 2020-01-13 ENCOUNTER — Telehealth: Payer: Medicare PPO

## 2020-01-13 DIAGNOSIS — N184 Chronic kidney disease, stage 4 (severe): Secondary | ICD-10-CM | POA: Diagnosis not present

## 2020-01-13 DIAGNOSIS — E1122 Type 2 diabetes mellitus with diabetic chronic kidney disease: Secondary | ICD-10-CM | POA: Diagnosis not present

## 2020-01-13 DIAGNOSIS — D631 Anemia in chronic kidney disease: Secondary | ICD-10-CM | POA: Diagnosis not present

## 2020-01-13 DIAGNOSIS — Z7982 Long term (current) use of aspirin: Secondary | ICD-10-CM | POA: Diagnosis not present

## 2020-01-13 DIAGNOSIS — Z87891 Personal history of nicotine dependence: Secondary | ICD-10-CM | POA: Diagnosis not present

## 2020-01-13 DIAGNOSIS — I5043 Acute on chronic combined systolic (congestive) and diastolic (congestive) heart failure: Secondary | ICD-10-CM | POA: Diagnosis not present

## 2020-01-13 DIAGNOSIS — I0981 Rheumatic heart failure: Secondary | ICD-10-CM | POA: Diagnosis not present

## 2020-01-13 DIAGNOSIS — N186 End stage renal disease: Secondary | ICD-10-CM | POA: Diagnosis not present

## 2020-01-13 DIAGNOSIS — D649 Anemia, unspecified: Secondary | ICD-10-CM | POA: Diagnosis not present

## 2020-01-13 DIAGNOSIS — G92 Toxic encephalopathy: Secondary | ICD-10-CM | POA: Diagnosis not present

## 2020-01-13 DIAGNOSIS — Z7902 Long term (current) use of antithrombotics/antiplatelets: Secondary | ICD-10-CM | POA: Diagnosis not present

## 2020-01-13 DIAGNOSIS — E875 Hyperkalemia: Secondary | ICD-10-CM | POA: Diagnosis not present

## 2020-01-13 DIAGNOSIS — R0602 Shortness of breath: Secondary | ICD-10-CM | POA: Diagnosis not present

## 2020-01-13 DIAGNOSIS — R61 Generalized hyperhidrosis: Secondary | ICD-10-CM | POA: Diagnosis present

## 2020-01-13 DIAGNOSIS — I132 Hypertensive heart and chronic kidney disease with heart failure and with stage 5 chronic kidney disease, or end stage renal disease: Secondary | ICD-10-CM | POA: Diagnosis not present

## 2020-01-13 DIAGNOSIS — I5042 Chronic combined systolic (congestive) and diastolic (congestive) heart failure: Secondary | ICD-10-CM | POA: Diagnosis not present

## 2020-01-13 DIAGNOSIS — N185 Chronic kidney disease, stage 5: Secondary | ICD-10-CM | POA: Diagnosis not present

## 2020-01-13 DIAGNOSIS — Z79899 Other long term (current) drug therapy: Secondary | ICD-10-CM | POA: Diagnosis not present

## 2020-01-13 DIAGNOSIS — Z7189 Other specified counseling: Secondary | ICD-10-CM | POA: Diagnosis not present

## 2020-01-13 DIAGNOSIS — E785 Hyperlipidemia, unspecified: Secondary | ICD-10-CM | POA: Diagnosis present

## 2020-01-13 DIAGNOSIS — I2510 Atherosclerotic heart disease of native coronary artery without angina pectoris: Secondary | ICD-10-CM | POA: Diagnosis not present

## 2020-01-13 DIAGNOSIS — N179 Acute kidney failure, unspecified: Secondary | ICD-10-CM | POA: Diagnosis present

## 2020-01-13 DIAGNOSIS — T361X5D Adverse effect of cephalosporins and other beta-lactam antibiotics, subsequent encounter: Secondary | ICD-10-CM | POA: Diagnosis not present

## 2020-01-13 DIAGNOSIS — R627 Adult failure to thrive: Secondary | ICD-10-CM | POA: Diagnosis not present

## 2020-01-13 DIAGNOSIS — I255 Ischemic cardiomyopathy: Secondary | ICD-10-CM | POA: Diagnosis not present

## 2020-01-13 DIAGNOSIS — I252 Old myocardial infarction: Secondary | ICD-10-CM | POA: Diagnosis not present

## 2020-01-13 DIAGNOSIS — Z515 Encounter for palliative care: Secondary | ICD-10-CM | POA: Diagnosis not present

## 2020-01-13 DIAGNOSIS — Z83438 Family history of other disorder of lipoprotein metabolism and other lipidemia: Secondary | ICD-10-CM | POA: Diagnosis not present

## 2020-01-13 DIAGNOSIS — Z8673 Personal history of transient ischemic attack (TIA), and cerebral infarction without residual deficits: Secondary | ICD-10-CM | POA: Diagnosis not present

## 2020-01-13 DIAGNOSIS — Z20822 Contact with and (suspected) exposure to covid-19: Secondary | ICD-10-CM | POA: Diagnosis present

## 2020-01-13 DIAGNOSIS — Z992 Dependence on renal dialysis: Secondary | ICD-10-CM | POA: Diagnosis not present

## 2020-01-13 DIAGNOSIS — T8249XA Other complication of vascular dialysis catheter, initial encounter: Secondary | ICD-10-CM | POA: Diagnosis present

## 2020-01-13 DIAGNOSIS — I130 Hypertensive heart and chronic kidney disease with heart failure and stage 1 through stage 4 chronic kidney disease, or unspecified chronic kidney disease: Secondary | ICD-10-CM | POA: Diagnosis not present

## 2020-01-13 DIAGNOSIS — E872 Acidosis: Secondary | ICD-10-CM | POA: Diagnosis present

## 2020-01-13 DIAGNOSIS — Z8249 Family history of ischemic heart disease and other diseases of the circulatory system: Secondary | ICD-10-CM | POA: Diagnosis not present

## 2020-01-13 DIAGNOSIS — E1129 Type 2 diabetes mellitus with other diabetic kidney complication: Secondary | ICD-10-CM | POA: Diagnosis not present

## 2020-01-13 LAB — I-STAT ARTERIAL BLOOD GAS, ED
Acid-base deficit: 9 mmol/L — ABNORMAL HIGH (ref 0.0–2.0)
Bicarbonate: 16.1 mmol/L — ABNORMAL LOW (ref 20.0–28.0)
Calcium, Ion: 1.17 mmol/L (ref 1.15–1.40)
HCT: 20 % — ABNORMAL LOW (ref 39.0–52.0)
Hemoglobin: 6.8 g/dL — CL (ref 13.0–17.0)
O2 Saturation: 94 %
Patient temperature: 94
Potassium: 5.7 mmol/L — ABNORMAL HIGH (ref 3.5–5.1)
Sodium: 143 mmol/L (ref 135–145)
TCO2: 17 mmol/L — ABNORMAL LOW (ref 22–32)
pCO2 arterial: 26.5 mmHg — ABNORMAL LOW (ref 32.0–48.0)
pH, Arterial: 7.379 (ref 7.350–7.450)
pO2, Arterial: 61 mmHg — ABNORMAL LOW (ref 83.0–108.0)

## 2020-01-13 LAB — HEMOGLOBIN AND HEMATOCRIT, BLOOD
HCT: 25.8 % — ABNORMAL LOW (ref 39.0–52.0)
Hemoglobin: 8.1 g/dL — ABNORMAL LOW (ref 13.0–17.0)

## 2020-01-13 LAB — HEPATIC FUNCTION PANEL
ALT: 23 U/L (ref 0–44)
AST: 23 U/L (ref 15–41)
Albumin: 2.7 g/dL — ABNORMAL LOW (ref 3.5–5.0)
Alkaline Phosphatase: 66 U/L (ref 38–126)
Bilirubin, Direct: <0.1 mg/dL (ref 0.0–0.2)
Total Bilirubin: 0.5 mg/dL (ref 0.3–1.2)
Total Protein: 5.8 g/dL — ABNORMAL LOW (ref 6.5–8.1)

## 2020-01-13 LAB — COMPREHENSIVE METABOLIC PANEL
ALT: 19 U/L (ref 0–44)
AST: 17 U/L (ref 15–41)
Albumin: 2.6 g/dL — ABNORMAL LOW (ref 3.5–5.0)
Alkaline Phosphatase: 57 U/L (ref 38–126)
Anion gap: 15 (ref 5–15)
BUN: 100 mg/dL — ABNORMAL HIGH (ref 8–23)
CO2: 14 mmol/L — ABNORMAL LOW (ref 22–32)
Calcium: 9.9 mg/dL (ref 8.9–10.3)
Chloride: 112 mmol/L — ABNORMAL HIGH (ref 98–111)
Creatinine, Ser: 6.24 mg/dL — ABNORMAL HIGH (ref 0.61–1.24)
GFR calc Af Amer: 10 mL/min — ABNORMAL LOW (ref >60)
GFR calc non Af Amer: 8 mL/min — ABNORMAL LOW (ref >60)
Glucose, Bld: 108 mg/dL — ABNORMAL HIGH (ref 70–99)
Potassium: 5.2 mmol/L — ABNORMAL HIGH (ref 3.5–5.1)
Sodium: 141 mmol/L (ref 135–145)
Total Bilirubin: 0.6 mg/dL (ref 0.3–1.2)
Total Protein: 5.5 g/dL — ABNORMAL LOW (ref 6.5–8.1)

## 2020-01-13 LAB — URINALYSIS, ROUTINE W REFLEX MICROSCOPIC
Bilirubin Urine: NEGATIVE
Glucose, UA: 50 mg/dL — AB
Hgb urine dipstick: NEGATIVE
Ketones, ur: NEGATIVE mg/dL
Nitrite: NEGATIVE
Protein, ur: >=300 mg/dL — AB
Specific Gravity, Urine: 1.012 (ref 1.005–1.030)
WBC, UA: >50 WBC/hpf — ABNORMAL HIGH (ref 0–5)
pH: 5 (ref 5.0–8.0)

## 2020-01-13 LAB — CBC
HCT: 23.6 % — ABNORMAL LOW (ref 39.0–52.0)
Hemoglobin: 7.1 g/dL — ABNORMAL LOW (ref 13.0–17.0)
MCH: 29 pg (ref 26.0–34.0)
MCHC: 30.1 g/dL (ref 30.0–36.0)
MCV: 96.3 fL (ref 80.0–100.0)
Platelets: 139 10*3/uL — ABNORMAL LOW (ref 150–400)
RBC: 2.45 MIL/uL — ABNORMAL LOW (ref 4.22–5.81)
RDW: 16.8 % — ABNORMAL HIGH (ref 11.5–15.5)
WBC: 5.5 10*3/uL (ref 4.0–10.5)
nRBC: 0 % (ref 0.0–0.2)

## 2020-01-13 LAB — PREPARE RBC (CROSSMATCH)

## 2020-01-13 LAB — TROPONIN I (HIGH SENSITIVITY)
Troponin I (High Sensitivity): 17 ng/L (ref <18)
Troponin I (High Sensitivity): 16 ng/L (ref <18)

## 2020-01-13 LAB — BASIC METABOLIC PANEL
Anion gap: 14 (ref 5–15)
BUN: 103 mg/dL — ABNORMAL HIGH (ref 8–23)
CO2: 15 mmol/L — ABNORMAL LOW (ref 22–32)
Calcium: 8.4 mg/dL — ABNORMAL LOW (ref 8.9–10.3)
Chloride: 112 mmol/L — ABNORMAL HIGH (ref 98–111)
Creatinine, Ser: 6.3 mg/dL — ABNORMAL HIGH (ref 0.61–1.24)
GFR calc Af Amer: 10 mL/min — ABNORMAL LOW (ref >60)
GFR calc non Af Amer: 8 mL/min — ABNORMAL LOW (ref >60)
Glucose, Bld: 190 mg/dL — ABNORMAL HIGH (ref 70–99)
Potassium: 5.7 mmol/L — ABNORMAL HIGH (ref 3.5–5.1)
Sodium: 141 mmol/L (ref 135–145)

## 2020-01-13 LAB — CBG MONITORING, ED: Glucose-Capillary: 104 mg/dL — ABNORMAL HIGH (ref 70–99)

## 2020-01-13 LAB — BRAIN NATRIURETIC PEPTIDE: B Natriuretic Peptide: 705.7 pg/mL — ABNORMAL HIGH (ref 0.0–100.0)

## 2020-01-13 LAB — GLUCOSE, CAPILLARY
Glucose-Capillary: 124 mg/dL — ABNORMAL HIGH (ref 70–99)
Glucose-Capillary: 165 mg/dL — ABNORMAL HIGH (ref 70–99)

## 2020-01-13 LAB — SARS CORONAVIRUS 2 BY RT PCR (HOSPITAL ORDER, PERFORMED IN ~~LOC~~ HOSPITAL LAB): SARS Coronavirus 2: NEGATIVE

## 2020-01-13 LAB — LACTIC ACID, PLASMA
Lactic Acid, Venous: 0.6 mmol/L (ref 0.5–1.9)
Lactic Acid, Venous: 0.6 mmol/L (ref 0.5–1.9)

## 2020-01-13 MED ORDER — SODIUM ZIRCONIUM CYCLOSILICATE 10 G PO PACK
10.0000 g | PACK | Freq: Once | ORAL | Status: AC
  Administered 2020-01-13: 10 g via ORAL
  Filled 2020-01-13: qty 1

## 2020-01-13 MED ORDER — ACETAMINOPHEN 650 MG RE SUPP: 650.0000 mg | Freq: Four times a day (QID) | RECTAL | Status: DC | PRN

## 2020-01-13 MED ORDER — FUROSEMIDE 10 MG/ML IJ SOLN
80.0000 mg | Freq: Once | INTRAMUSCULAR | Status: AC
  Administered 2020-01-13: 80 mg via INTRAVENOUS
  Filled 2020-01-13: qty 8

## 2020-01-13 MED ORDER — CLOPIDOGREL BISULFATE 75 MG PO TABS
75.0000 mg | ORAL_TABLET | Freq: Every day | ORAL | Status: DC
  Administered 2020-01-13 – 2020-01-21 (×7): 75 mg via ORAL
  Filled 2020-01-13 (×7): qty 1

## 2020-01-13 MED ORDER — SODIUM CHLORIDE 0.9% IV SOLUTION: Freq: Once | INTRAVENOUS | Status: AC

## 2020-01-13 MED ORDER — ACETAMINOPHEN 325 MG PO TABS
650.0000 mg | ORAL_TABLET | Freq: Four times a day (QID) | ORAL | Status: DC | PRN
  Administered 2020-01-16 – 2020-01-19 (×2): 650 mg via ORAL
  Filled 2020-01-13 (×3): qty 2

## 2020-01-13 MED ORDER — CALCIUM GLUCONATE-NACL 1-0.675 GM/50ML-% IV SOLN
1.0000 g | Freq: Once | INTRAVENOUS | Status: AC
  Administered 2020-01-13: 1000 mg via INTRAVENOUS
  Filled 2020-01-13: qty 50

## 2020-01-13 MED ORDER — HYDRALAZINE HCL 50 MG PO TABS
50.0000 mg | ORAL_TABLET | Freq: Three times a day (TID) | ORAL | Status: DC
  Administered 2020-01-13 – 2020-01-20 (×19): 50 mg via ORAL
  Filled 2020-01-13 (×5): qty 1
  Filled 2020-01-13: qty 2
  Filled 2020-01-13 (×14): qty 1
  Filled 2020-01-13: qty 2

## 2020-01-13 MED ORDER — HEPARIN SODIUM (PORCINE) 5000 UNIT/ML IJ SOLN
5000.0000 [IU] | Freq: Three times a day (TID) | INTRAMUSCULAR | Status: DC
  Administered 2020-01-13 (×3): 5000 [IU] via SUBCUTANEOUS
  Filled 2020-01-13 (×4): qty 1

## 2020-01-13 MED ORDER — SODIUM CHLORIDE 0.9% FLUSH
3.0000 mL | Freq: Two times a day (BID) | INTRAVENOUS | Status: DC
  Administered 2020-01-13 – 2020-01-20 (×12): 3 mL via INTRAVENOUS

## 2020-01-13 MED ORDER — ONDANSETRON HCL 4 MG/2ML IJ SOLN: 4.0000 mg | Freq: Four times a day (QID) | INTRAMUSCULAR | Status: DC | PRN

## 2020-01-13 MED ORDER — ASPIRIN EC 81 MG PO TBEC
81.0000 mg | DELAYED_RELEASE_TABLET | Freq: Every day | ORAL | Status: DC
  Administered 2020-01-13 – 2020-01-21 (×7): 81 mg via ORAL
  Filled 2020-01-13 (×7): qty 1

## 2020-01-13 MED ORDER — ATORVASTATIN CALCIUM 80 MG PO TABS
80.0000 mg | ORAL_TABLET | Freq: Every day | ORAL | Status: DC
  Administered 2020-01-13 – 2020-01-20 (×8): 80 mg via ORAL
  Filled 2020-01-13 (×8): qty 1

## 2020-01-13 MED ORDER — CARVEDILOL 25 MG PO TABS
25.0000 mg | ORAL_TABLET | Freq: Two times a day (BID) | ORAL | Status: DC
  Administered 2020-01-13 – 2020-01-21 (×15): 25 mg via ORAL
  Filled 2020-01-13 (×2): qty 1
  Filled 2020-01-13: qty 2
  Filled 2020-01-13 (×12): qty 1

## 2020-01-13 MED ORDER — ONDANSETRON HCL 4 MG PO TABS: 4.0000 mg | ORAL_TABLET | Freq: Four times a day (QID) | ORAL | Status: DC | PRN

## 2020-01-13 MED ORDER — SENNOSIDES-DOCUSATE SODIUM 8.6-50 MG PO TABS: 1.0000 | ORAL_TABLET | Freq: Every evening | ORAL | Status: DC | PRN

## 2020-01-13 NOTE — ED Notes (Signed)
Breakfast ordered 

## 2020-01-13 NOTE — Chronic Care Management (AMB) (Signed)
  Care Management   Note  01/13/2020 Name: Dennis Macias MRN: [id number] DOB: 08/12/1950  This CCM RN was scheduled to contact patient today for CCM initial intake assessment however patient was admitted to [facility] 01/12/20 for acute on chronic renal failure.   CCM will monitor patient's progress and complete transition of care call when patient is discharged from the hospital if he returns home.  Kelli Churn RN, CCM, [facility] RN Care Manager [phone]

## 2020-01-13 NOTE — Progress Notes (Deleted)
Cardiology Office Note   Date:  01/13/2020   ID:  Dennis Macias, DOB 08-Mar-1951, MRN [id number]  PCP:  [name], MD    No chief complaint on file.  CAD  Wt Readings from Last 3 Encounters:  01/13/20 167 lb 15.9 oz (76.2 kg)  12/26/19 157 lb 3.2 oz (71.3 kg)  12/14/19 170 lb (77.1 kg)       History of Present Illness: Dennis Macias is a 69 y.o. male  Who had syncope back in 2018.  In 2021, he had a cath showing severe LAD disease, but PCI was deferred due to stage IV kidney disease.    He has had episodic volume overload treated with IV diuresis, most recently in June 2021.     Past Medical History:  Diagnosis Date  . Anemia   . Arthritis    past hx   . Blindness    right eye  . Cardiorenal syndrome   . Cataract    removed both eyes  . Dehydration   . Diabetes ([place])   . Glaucoma   . History of CVA (cerebrovascular accident) 09/13/2015  . History of stroke 09/13/2015  . History of urinary retention   . Hyperlipidemia   . Hypertension   . NSTEMI (non-ST elevated myocardial infarction) ([place])   . Pernicious anemia 02/24/2018  . S/P TURP   . Stroke [facility])    2017- March  . Syncope 11/2019  . Tachycardia 08/26/2017  . Tubular adenoma of colon 02/2017  . Weight loss, non-intentional 08/26/2017   10 lbs between 6/18 & 2/19    Past Surgical History:  Procedure Laterality Date  . CATARACT EXTRACTION, BILATERAL    . COLONOSCOPY    . IR THORACENTESIS ASP PLEURAL SPACE W/IMG GUIDE  09/21/2019  . IR THORACENTESIS ASP PLEURAL SPACE W/IMG GUIDE  10/16/2019  . LEFT HEART CATH AND CORONARY ANGIOGRAPHY N/A 11/19/2019   Procedure: LEFT HEART CATH AND CORONARY ANGIOGRAPHY;  Surgeon: Jettie Booze, MD;  Location: [facility];  Service: Cardiovascular;  Laterality: N/A;  . POLYPECTOMY    . REFRACTIVE SURGERY  10/2017  . TEE WITHOUT CARDIOVERSION N/A 09/14/2015   Procedure: TRANSESOPHAGEAL ECHOCARDIOGRAM (TEE);  Surgeon: Larey Dresser, MD;  Location: [place];   Service: Cardiovascular;  Laterality: N/A;  . TRANSURETHRAL RESECTION OF PROSTATE N/A 10/20/2019   Procedure: TRANSURETHRAL RESECTION OF THE PROSTATE (TURP);  Surgeon: Irine Seal, MD;  Location: [facility];  Service: Urology;  Laterality: N/A;  . UPPER GASTROINTESTINAL ENDOSCOPY       No current facility-administered medications for this visit.   No current outpatient medications on file.   Facility-Administered Medications Ordered in Other Visits  Medication Dose Route Frequency Provider Last Rate Last Admin  . acetaminophen (TYLENOL) tablet 650 mg  650 mg Oral Q6H PRN Masoudi, Elhamalsadat, MD       Or  . acetaminophen (TYLENOL) suppository 650 mg  650 mg Rectal Q6H PRN Masoudi, Elhamalsadat, MD      . aspirin EC tablet 81 mg  81 mg Oral Daily Masoudi, Elhamalsadat, MD   81 mg at 01/13/20 1005  . atorvastatin (LIPITOR) tablet 80 mg  80 mg Oral QHS Masoudi, Elhamalsadat, MD      . carvedilol (COREG) tablet 25 mg  25 mg Oral BID WC Masoudi, Elhamalsadat, MD   25 mg at 01/13/20 1739  . clopidogrel (PLAVIX) tablet 75 mg  75 mg Oral Daily Masoudi, Elhamalsadat, MD   75 mg at 01/13/20 1005  .  heparin injection 5,000 Units  5,000 Units Subcutaneous Q8H Masoudi, Elhamalsadat, MD   5,000 Units at 01/13/20 1455  . hydrALAZINE (APRESOLINE) tablet 50 mg  50 mg Oral Q8H Masoudi, Elhamalsadat, MD   50 mg at 01/13/20 1453  . ondansetron (ZOFRAN) tablet 4 mg  4 mg Oral Q6H PRN Masoudi, Elhamalsadat, MD       Or  . ondansetron (ZOFRAN) injection 4 mg  4 mg Intravenous Q6H PRN Masoudi, Elhamalsadat, MD      . senna-docusate (Senokot-S) tablet 1 tablet  1 tablet Oral QHS PRN Masoudi, Elhamalsadat, MD      . sodium chloride flush (NS) 0.9 % injection 3 mL  3 mL Intravenous Q12H Masoudi, Elhamalsadat, MD        Allergies:   Cefepime    Social History:  The patient  reports that he has quit smoking. He quit smokeless tobacco use about 41 years ago.  His smokeless tobacco use included chew. He reports that he  does not drink alcohol and does not use drugs.   Family History:  The patient's ***family history includes Hyperlipidemia in his father and mother; Hypertension in his mother.    ROS:  Please see the history of present illness.   Otherwise, review of systems are positive for ***.   All other systems are reviewed and negative.    PHYSICAL EXAM: VS:  There were no vitals taken for this visit. , BMI There is no height or weight on file to calculate BMI. GEN: Well nourished, well developed, in no acute distress  HEENT: normal  Neck: no JVD, carotid bruits, or masses Cardiac: ***RRR; no murmurs, rubs, or gallops,no edema  Respiratory:  clear to auscultation bilaterally, normal work of breathing GI: soft, nontender, nondistended, + BS MS: no deformity or atrophy  Skin: warm and dry, no rash Neuro:  Strength and sensation are intact Psych: euthymic mood, full affect   EKG:   The ekg ordered today demonstrates ***   Recent Labs: 11/13/2019: TSH 4.534 11/20/2019: Magnesium 2.1 01/13/2020: ALT 19; B Natriuretic Peptide 705.7; BUN 100; Creatinine, Ser 6.24; Hemoglobin 8.1; Platelets 139; Potassium 5.2; Sodium 141   Lipid Panel    Component Value Date/Time   CHOL 120 12/25/2019 0519   CHOL 123 10/14/2019 1028   TRIG 68 12/25/2019 0519   HDL 58 12/25/2019 0519   HDL 65 10/14/2019 1028   CHOLHDL 2.1 12/25/2019 0519   VLDL 14 12/25/2019 0519   LDLCALC 48 12/25/2019 0519   LDLCALC 49 10/14/2019 1028     Other studies Reviewed: Additional studies/ records that were reviewed today with results demonstrating: ***.   ASSESSMENT AND PLAN:  1. CAD:  Cath films reviewed.  Left dominant circulation. Diffusely diseased LAD.  Two areas of tight stenosis but moderate disease throughout which makes stenting more difficult. 2. Stage IV CKD.  PCI would be high risk and he may end up on dialysis.  3. HTN: 4. DM:   Current medicines are reviewed at length with the patient today.  The patient  concerns regarding his medicines were addressed.  The following changes have been made:  No change***  Labs/ tests ordered today include: *** No orders of the defined types were placed in this encounter.   Recommend 150 minutes/week of aerobic exercise Low fat, low carb, high fiber diet recommended  Disposition:   FU in ***   Signed, Larae Grooms, MD  01/13/2020 10:36 PM    [facility]  [address] [place], [place]  [address] Phone: [phone]; Fax: [phone]

## 2020-01-13 NOTE — H&P (Signed)
Date: 01/13/2020               Patient Name:  Dennis Macias MRN: [id number]  DOB: 07/29/1950 Age / Sex: 69 y.o., male   PCP: [name], MD         Medical Service: Internal Medicine Teaching Service         Attending Physician: Dr. Velna Ochs, MD    First Contact: Dr. Fatima Sanger, MD Pager: [phone]  Second Contact: Dr. Lonia Skinner, MD Pager: [phone]       After Hours (After 5p/  First Contact Pager: [phone]  weekends / holidays): Second Contact Pager: [phone]   Chief Complaint: sweating  History of Present Illness: Patient is 69yoM w/ HTN, HLD, CKD IV, combined HF (EF 40-45%), anemia of chronic dz who presents to hospital because "I sweat after I eat and feel cold."  Also reports HA that improved w/ Tylenol. Pt also reports some SOB over the last few days. Unable to confirm if he is experiencing dyspnea on exertion or orthopnea. His girlfriend told him that he does not seem to breath well and noticed him sweating, which is when she called EMS. Denies leg swelling, CP, fever, chills, n/v, urinary sxs. Pt recently had hospitalization for hypertensive emergency and HF exacerbation. Has not gone to nephrologist, reports next appointment is this Friday.   Upon arrival in ED, pt hypothermic at 34.7C, bradycardic, normotensive, and sating well at RA. Labs notable for worsening renal function, hyperkalemia, and bilateral pleural effusions. IMTS consulted for admission.  Past Medical History:  Diagnosis Date  . Anemia   . Arthritis    past hx   . Blindness    right eye  . Cardiorenal syndrome   . Cataract    removed both eyes  . Dehydration   . Diabetes ([place])   . Glaucoma   . History of CVA (cerebrovascular accident) 09/13/2015  . History of stroke 09/13/2015  . History of urinary retention   . Hyperlipidemia   . Hypertension   . NSTEMI (non-ST elevated myocardial infarction) ([place])   . Pernicious anemia 02/24/2018  . S/P TURP   . Stroke [facility])    2017- March  .  Syncope 11/2019  . Tachycardia 08/26/2017  . Tubular adenoma of colon 02/2017  . Weight loss, non-intentional 08/26/2017   10 lbs between 6/18 & 2/19   Meds:  Current Outpatient Medications  Medication Instructions  . Accu-Chek FastClix Lancets MISC Check blood sugar up to 7 times a week as instructed  . acetaminophen (TYLENOL) 325-650 mg, Oral, Every 4 hours PRN  . amLODipine (NORVASC) 10 MG tablet TAKE 1 TABLET(10 MG) BY MOUTH DAILY  . amLODipine (NORVASC) 10 mg, Oral, Daily  . aspirin EC 81 mg, Oral, Daily  . atorvastatin (LIPITOR) 80 mg, Oral, Daily at bedtime, IM program  . Blood Glucose Monitoring Suppl (ACCU-CHEK GUIDE) w/Device KIT 1 each, Does not apply, Daily, Check blood sugar as instructed up to 7 times a week  . carvedilol (COREG) 25 mg, Oral, 2 times daily with meals  . ciprofloxacin (CIPRO) 500 mg, Oral, Daily with breakfast  . clopidogrel (PLAVIX) 75 mg, Oral, Daily  . dorzolamide-timolol (COSOPT) 22.3-6.8 MG/ML ophthalmic solution 1 drop, Right Eye, 2 times daily  . finasteride (PROSCAR) 5 mg, Oral, Daily  . furosemide (LASIX) 80 mg, Oral, 2 times daily  . glucose blood (ACCU-CHEK GUIDE) test strip Check blood sugar up to 7 times a week as instructed  . hydrALAZINE (APRESOLINE)  50 MG tablet TAKE 1 TABLET(50 MG) BY MOUTH THREE TIMES DAILY  . hydrALAZINE (APRESOLINE) 50 mg, Oral, Every 8 hours  . isosorbide mononitrate (IMDUR) 60 mg, Oral, Daily  . melatonin 3 mg, Oral, Daily at bedtime  . tamsulosin (FLOMAX) 0.4 mg, Oral, Daily after supper  . vitamin B-12 (CYANOCOBALAMIN) 1,000 mcg, Oral, Daily    Allergies: Allergies as of 01/12/2020 - Review Complete 01/12/2020  Allergen Reaction Noted  . Cefepime Other (See Comments) 12/14/2019   Family History:  Family History  Problem Relation Age of Onset  . Hypertension Mother   . Hyperlipidemia Mother   . Hyperlipidemia Father   . Colon cancer Neg Hx   . Colon polyps Neg Hx   . Esophageal cancer Neg Hx   . Rectal  cancer Neg Hx   . Stomach cancer Neg Hx    Social History:  Social History   Tobacco Use  . Smoking status: Former Research scientist (life sciences)  . Smokeless tobacco: Former Systems developer    Types: Chew    Quit date: 07/09/1978  . Tobacco comment: quit 1 year ago  Vaping Use  . Vaping Use: Never used  Substance Use Topics  . Alcohol use: No    Alcohol/week: 1.0 standard drink    Types: 1 Cans of beer per week    Comment: quit last march/2017  . Drug use: No   Review of Systems: A complete ROS was negative except as per HPI.   Physical Exam: Blood pressure 136/65, pulse 64, temperature 98 F (36.7 C), temperature source Oral, resp. rate 13, SpO2 98 %.  Physical Exam Constitutional:      General: He is not in acute distress.    Appearance: He is not toxic-appearing.  HENT:     Head: Normocephalic and atraumatic.  Cardiovascular:     Rate and Rhythm: Normal rate and regular rhythm.     Pulses: Normal pulses.     Heart sounds: Murmur heard.   Pulmonary:     Effort: Pulmonary effort is normal. No tachypnea or respiratory distress.     Breath sounds: Examination of the right-lower field reveals rales. Examination of the left-lower field reveals rales. Rales present.  Abdominal:     Palpations: Abdomen is soft.     Tenderness: There is no abdominal tenderness.  Musculoskeletal:     Right lower leg: No edema.     Left lower leg: No edema.  Skin:    General: Skin is warm.  Neurological:     General: No focal deficit present.     Mental Status: He is alert.  Psychiatric:        Mood and Affect: Mood normal.        Behavior: Behavior normal. Behavior is not agitated.    Labs: -BMP: Na 141 / K 5.7 / Cl 112 / CO2 15 / Glucose 190 / BUN 103 / Cr 6.3 / AG 14  -CBC: WBC 5.2 / Hgb 7.5 / Hct 23.8 / Plt 148  -BNP 705 / trop neg  EKG: personally reviewed my interpretation is sinus bradycardia, left axis deviation, prolonged PR  CXR: personally reviewed my interpretation is bilateral pleural effusions,  infiltrate in right lower lobe.  Assessment & Plan by Problem: Active Problems:   Acute-on-chronic kidney injury [facility]) Patient is 69yoM w/ HTN, HLD, CKD IV, combined HF (EF 40-45%), anemia of chronic dz who presents with worsening renal function, hyperkalemia, bilateral pleural effusions in setting of CKD IV.  #Acute on Chronic Renal Failure #Dyspnea #  Hyperkalemia Per Dr. Justin Mend on 6/17, baseline Cr 3.5. Patient has not seen nephrologist since last admission. On home furosemide 60m BID. Reports generalized malaise, not feeling well. Unclear hx of dyspnea, but reportedly family called EMS d/t SOB. On exam, sating well on RA, crackles bilaterally in lower lung fields, no peripheral edema. Dyspnea most likely d/t CKD than CHF w/ BNP 700 (4000+ on last admission), current BUN 103/Cr 6.3.  Received diuretic in ED, will hold off on another dose d/t worsening renal fxn. Will consult nephrology. -In ED received IV furosemide 818m-Daily weight checks -Neuro checks q4hr -I&Os -Nephrology in AM   #Hyperkalemia On arrival, K 5.7 in setting of CKD, giving 1g calcium gluconate. -1g calcium gluconate -Tele -Monitor K  #Combined Heart Failure (EF 40-45%) -BNP 700 (4000+ on last admission), crackles on exam, no edema - more likely CKD -Continue home carvedilol  #HTN BP stable, normotensive since admission. -Continue home carvedilol, hydralazine -Hold home amlodipine, adjust as needed  Diet: HH w/ fluid restriction IVF: none DVT PPX: subQ heparin 5000u Code Status: Full  Dispo: Admit patient to Inpatient with expected length of stay greater than 2 midnights.  Signed: BrSanjuan DameMD 01/13/2020, 6:47 AM  Pager: @MYPAGER @ After 5pm on weekdays and 1pm on weekends: On Call pager: [phone]

## 2020-01-13 NOTE — Consult Note (Signed)
Reason for Consult: Progressive CKD stage V Referring Physician: Teaching service  Dennis Macias is an 69 y.o. male has an extensive PMH significant for DM, HTN, CVA, CAD, ischemic CMP (EF 35-40%, grade II DD, normal RV function), chronic systolic and diastolic CHF, nephrotic syndrome due to diabetic nephropathy, anemia of CKD stage V, FTT, and progressive CKD stage V who was brought to [facility] ED via EMS with worsening SOB over the previous 2 days.  In the ED he was noted to be hypothermic at 34.7, bradycardic, but stable vital signs and oxygenation.  Labs were notable for K of 5.7, BUN 103, Cr 6.3, BNP 705.7, alb 2.7, Hgb of 6.8, and CXR with R>L pleural effusions.  He was admitted for IV diuresis and we were consulted to further evaluate and manage his electrolyte abnormalities and progressive CKD.  The trend in Scr is seen below.  His eGFR has been steadily declining over the past 6 months from 18 to 10.   He has had multiple admissions over the past 7 months with acute on chronic CHF as well as infections and AMS.  He is followed in our office with Dr. Hollie Salk, however he has been hospitalized multiple times each month so he was never able to follow up as an outpatient.  He has discussed dialysis with Dr. Hollie Salk and was amenable to proceed if/when needed.  Trend in Creatinine: Creatinine, Ser  Date/Time Value Ref Range Status  01/13/2020 10:10 AM 6.24 (H) 0.61 - 1.24 mg/dL Final  01/12/2020 11:35 PM 6.30 (H) 0.61 - 1.24 mg/dL Final  12/26/2019 12:07 AM 5.11 (H) 0.61 - 1.24 mg/dL Final  12/25/2019 05:19 AM 4.98 (H) 0.61 - 1.24 mg/dL Final  12/24/2019 08:52 AM 4.74 (H) 0.61 - 1.24 mg/dL Final  12/10/2019 02:39 PM 3.84 (H) 0.76 - 1.27 mg/dL Final  12/04/2019 06:22 AM 4.31 (H) 0.61 - 1.24 mg/dL Final  12/03/2019 06:19 AM 4.37 (H) 0.61 - 1.24 mg/dL Final  12/02/2019 05:33 AM 4.33 (H) 0.61 - 1.24 mg/dL Final  12/01/2019 07:07 AM 4.35 (H) 0.61 - 1.24 mg/dL Final  11/30/2019 09:32 AM 4.38 (H) 0.61 - 1.24  mg/dL Final  11/29/2019 02:36 AM 4.50 (H) 0.61 - 1.24 mg/dL Final  11/28/2019 07:58 AM 4.39 (H) 0.61 - 1.24 mg/dL Final  11/27/2019 07:00 AM 4.27 (H) 0.61 - 1.24 mg/dL Final  11/26/2019 09:46 AM 4.16 (H) 0.61 - 1.24 mg/dL Final  11/25/2019 04:04 AM 4.09 (H) 0.61 - 1.24 mg/dL Final  11/24/2019 03:58 AM 4.07 (H) 0.61 - 1.24 mg/dL Final  11/23/2019 03:19 AM 3.99 (H) 0.61 - 1.24 mg/dL Final  11/22/2019 03:56 AM 4.10 (H) 0.61 - 1.24 mg/dL Final  11/21/2019 04:06 AM 3.99 (H) 0.61 - 1.24 mg/dL Final  11/20/2019 05:17 AM 4.01 (H) 0.61 - 1.24 mg/dL Final  11/19/2019 08:17 AM 3.88 (H) 0.61 - 1.24 mg/dL Final  11/18/2019 05:01 AM 3.88 (H) 0.61 - 1.24 mg/dL Final  11/17/2019 02:48 AM 3.56 (H) 0.61 - 1.24 mg/dL Final  11/16/2019 07:10 AM 3.52 (H) 0.61 - 1.24 mg/dL Final  11/15/2019 03:36 AM 3.34 (H) 0.61 - 1.24 mg/dL Final  11/14/2019 07:48 AM 3.42 (H) 0.61 - 1.24 mg/dL Final  11/13/2019 04:19 AM 3.65 (H) 0.61 - 1.24 mg/dL Final  11/12/2019 10:39 AM 3.94 (H) 0.61 - 1.24 mg/dL Final  11/12/2019 03:54 AM 3.90 (H) 0.61 - 1.24 mg/dL Final  11/11/2019 02:44 PM 3.88 (H) 0.61 - 1.24 mg/dL Final  11/11/2019 05:08 AM 4.03 (H) 0.61 - 1.24 mg/dL Final  11/10/2019 03:49 AM 4.18 (H) 0.61 - 1.24 mg/dL Final  11/09/2019 04:03 AM 4.34 (H) 0.61 - 1.24 mg/dL Final  11/08/2019 04:45 AM 4.13 (H) 0.61 - 1.24 mg/dL Final  11/07/2019 03:18 AM 3.94 (H) 0.61 - 1.24 mg/dL Final  11/06/2019 09:35 AM 3.84 (H) 0.61 - 1.24 mg/dL Final  10/22/2019 05:05 AM 3.55 (H) 0.61 - 1.24 mg/dL Final  10/21/2019 04:54 AM 3.62 (H) 0.61 - 1.24 mg/dL Final  10/20/2019 05:03 AM 3.45 (H) 0.61 - 1.24 mg/dL Final  10/19/2019 05:53 AM 3.52 (H) 0.61 - 1.24 mg/dL Final  10/18/2019 04:42 PM 3.49 (H) 0.61 - 1.24 mg/dL Final  10/18/2019 04:54 AM 3.52 (H) 0.61 - 1.24 mg/dL Final  10/17/2019 04:47 PM 3.56 (H) 0.61 - 1.24 mg/dL Final  10/17/2019 06:03 AM 3.66 (H) 0.61 - 1.24 mg/dL Final  10/16/2019 05:13 PM 3.44 (H) 0.61 - 1.24 mg/dL Final  10/16/2019  01:07 AM 3.35 (H) 0.61 - 1.24 mg/dL Final  10/14/2019 10:59 PM 3.27 (H) 0.61 - 1.24 mg/dL Final  10/13/2019 02:48 PM 3.26 (H) 0.61 - 1.24 mg/dL Final  09/21/2019 05:02 AM 3.54 (H) 0.61 - 1.24 mg/dL Final  09/20/2019 01:54 AM 3.67 (H) 0.61 - 1.24 mg/dL Final  09/19/2019 04:18 AM 3.73 (H) 0.61 - 1.24 mg/dL Final    PMH:   Past Medical History:  Diagnosis Date  . Anemia   . Arthritis    past hx   . Blindness    right eye  . Cardiorenal syndrome   . Cataract    removed both eyes  . Dehydration   . Diabetes ([place])   . Glaucoma   . History of CVA (cerebrovascular accident) 09/13/2015  . History of stroke 09/13/2015  . History of urinary retention   . Hyperlipidemia   . Hypertension   . NSTEMI (non-ST elevated myocardial infarction) ([place])   . Pernicious anemia 02/24/2018  . S/P TURP   . Stroke [facility])    2017- March  . Syncope 11/2019  . Tachycardia 08/26/2017  . Tubular adenoma of colon 02/2017  . Weight loss, non-intentional 08/26/2017   10 lbs between 6/18 & 2/19    PSH:   Past Surgical History:  Procedure Laterality Date  . CATARACT EXTRACTION, BILATERAL    . COLONOSCOPY    . IR THORACENTESIS ASP PLEURAL SPACE W/IMG GUIDE  09/21/2019  . IR THORACENTESIS ASP PLEURAL SPACE W/IMG GUIDE  10/16/2019  . LEFT HEART CATH AND CORONARY ANGIOGRAPHY N/A 11/19/2019   Procedure: LEFT HEART CATH AND CORONARY ANGIOGRAPHY;  Surgeon: Jettie Booze, MD;  Location: [facility];  Service: Cardiovascular;  Laterality: N/A;  . POLYPECTOMY    . REFRACTIVE SURGERY  10/2017  . TEE WITHOUT CARDIOVERSION N/A 09/14/2015   Procedure: TRANSESOPHAGEAL ECHOCARDIOGRAM (TEE);  Surgeon: Larey Dresser, MD;  Location: [facility];  Service: Cardiovascular;  Laterality: N/A;  . TRANSURETHRAL RESECTION OF PROSTATE N/A 10/20/2019   Procedure: TRANSURETHRAL RESECTION OF THE PROSTATE (TURP);  Surgeon: Irine Seal, MD;  Location: [facility];  Service: Urology;  Laterality: N/A;  . UPPER GASTROINTESTINAL ENDOSCOPY       Allergies:  Allergies  Allergen Reactions  . Cefepime Other (See Comments)    Pt had BAD encephalopathy from Cefepime    Medications:   Prior to Admission medications   Medication Sig Start Date End Date Taking? Authorizing Provider  acetaminophen (TYLENOL) 325 MG tablet Take 1-2 tablets (325-650 mg total) by mouth every 4 (four) hours as needed for mild pain. 12/03/19  Yes Angiulli, Lavon Paganini, PA-C  amLODipine (NORVASC) 10 MG tablet Take 1 tablet (10 mg total) by mouth daily. 12/27/19  Yes Seawell, Jaimie A, DO  aspirin EC 81 MG tablet Take 1 tablet (81 mg total) by mouth daily. 10/22/19  Yes Eugenie Filler, MD  atorvastatin (LIPITOR) 80 MG tablet Take 1 tablet (80 mg total) by mouth at bedtime. IM program 12/03/19  Yes Angiulli, Lavon Paganini, PA-C  carvedilol (COREG) 25 MG tablet Take 1 tablet (25 mg total) by mouth 2 (two) times daily with a meal. 01/04/20  Yes [name], Rylee, MD  clopidogrel (PLAVIX) 75 MG tablet Take 1 tablet (75 mg total) by mouth daily. 12/03/19  Yes Angiulli, Lavon Paganini, PA-C  dorzolamide-timolol (COSOPT) 22.3-6.8 MG/ML ophthalmic solution Place 1 drop into the right eye 2 (two) times daily. 12/03/19  Yes Angiulli, Lavon Paganini, PA-C  finasteride (PROSCAR) 5 MG tablet Take 1 tablet (5 mg total) by mouth daily. 12/03/19  Yes Angiulli, Lavon Paganini, PA-C  furosemide (LASIX) 80 MG tablet Take 1 tablet (80 mg total) by mouth 2 (two) times daily. 12/15/19  Yes Lucious Groves, DO  hydrALAZINE (APRESOLINE) 50 MG tablet Take 1 tablet (50 mg total) by mouth every 8 (eight) hours. 12/26/19  Yes Seawell, Jaimie A, DO  isosorbide mononitrate (IMDUR) 60 MG 24 hr tablet Take 1 tablet (60 mg total) by mouth daily. 12/03/19 01/13/20 Yes Angiulli, Lavon Paganini, PA-C  tamsulosin (FLOMAX) 0.4 MG CAPS capsule Take 1 capsule (0.4 mg total) by mouth daily after supper. 12/03/19  Yes Angiulli, Lavon Paganini, PA-C  vitamin B-12 (CYANOCOBALAMIN) 1000 MCG tablet Take 1 tablet (1,000 mcg total) by mouth daily. 12/03/19   Yes Angiulli, Lavon Paganini, PA-C  Accu-Chek FastClix Lancets MISC Check blood sugar up to 7 times a week as instructed 12/03/19   Angiulli, Lavon Paganini, PA-C  amLODipine (NORVASC) 10 MG tablet TAKE 1 TABLET(10 MG) BY MOUTH DAILY Patient not taking: Reported on 01/13/2020 12/28/19   [name], MD  Blood Glucose Monitoring Suppl (ACCU-CHEK GUIDE) w/Device KIT 1 each by Does not apply route daily. Check blood sugar as instructed up to 7 times a week 12/03/19   Angiulli, Lavon Paganini, PA-C  ciprofloxacin (CIPRO) 250 MG tablet Take 2 tablets (500 mg total) by mouth daily with breakfast. Patient not taking: Reported on 01/13/2020 12/27/19   Molli Hazard A, DO  glucose blood (ACCU-CHEK GUIDE) test strip Check blood sugar up to 7 times a week as instructed 12/03/19   Angiulli, Lavon Paganini, PA-C  hydrALAZINE (APRESOLINE) 50 MG tablet TAKE 1 TABLET(50 MG) BY MOUTH THREE TIMES DAILY Patient not taking: Reported on 01/13/2020 12/28/19   [name], MD  melatonin 3 MG TABS tablet Take 1 tablet (3 mg total) by mouth at bedtime. Patient not taking: Reported on 01/13/2020 12/03/19   Cathlyn Parsons, PA-C    Inpatient medications: . aspirin EC  81 mg Oral Daily  . atorvastatin  80 mg Oral QHS  . carvedilol  25 mg Oral BID WC  . clopidogrel  75 mg Oral Daily  . heparin  5,000 Units Subcutaneous Q8H  . hydrALAZINE  50 mg Oral Q8H  . sodium chloride flush  3 mL Intravenous Q12H    Discontinued Meds:  There are no discontinued medications.  Social History:  reports that he has quit smoking. He quit smokeless tobacco use about 41 years ago.  His smokeless tobacco use included chew. He reports that he does not drink alcohol and does not  use drugs.  Family History:   Family History  Problem Relation Age of Onset  . Hypertension Mother   . Hyperlipidemia Mother   . Hyperlipidemia Father   . Colon cancer Neg Hx   . Colon polyps Neg Hx   . Esophageal cancer Neg Hx   . Rectal cancer Neg Hx   . Stomach cancer Neg Hx      Constitutional: positive for anorexia, fatigue, malaise, sweats and weight loss Respiratory: positive for shortness of breath Gastrointestinal: positive for nausea Weight change:   Intake/Output Summary (Last 24 hours) at 01/13/2020 1629 Last data filed at 01/13/2020 1502 Gross per 24 hour  Intake 390 ml  Output 550 ml  Net -160 ml   BP 137/72 (BP Location: Right Arm)   Pulse 63   Temp 97.8 F (36.6 C) (Oral)   Resp 18   Ht 6' (1.829 m)   Wt 76.2 kg   SpO2 99%   BMI 22.78 kg/m  Vitals:   01/13/20 1400 01/13/20 1458 01/13/20 1529 01/13/20 1532  BP: 133/69 133/69 137/72   Pulse: 61 61 63   Resp:  16 18   Temp:  97.7 F (36.5 C) 97.8 F (36.6 C)   TempSrc:  Oral Oral   SpO2: 97% 98% 99%   Weight:    76.2 kg  Height:    6' (1.829 m)     General appearance: cachectic, fatigued, no distress and slowed mentation Head: Normocephalic, without obvious abnormality, atraumatic Resp: diminished breath sounds bibasilar Cardio: no rub and bradycardic GI: soft, non-tender; bowel sounds normal; no masses,  no organomegaly Extremities: edema trace pretibial edema  Labs: Basic Metabolic Panel: Recent Labs  Lab 01/12/20 2335 01/13/20 0107 01/13/20 0136 01/13/20 1010  NA 141  --  143 141  K 5.7*  --  5.7* 5.2*  CL 112*  --   --  112*  CO2 15*  --   --  14*  GLUCOSE 190*  --   --  108*  BUN 103*  --   --  100*  CREATININE 6.30*  --   --  6.24*  ALBUMIN  --  2.7*  --  2.6*  CALCIUM 8.4*  --   --  9.9   Liver Function Tests: Recent Labs  Lab 01/13/20 0107 01/13/20 1010  AST 23 17  ALT 23 19  ALKPHOS 66 57  BILITOT 0.5 0.6  PROT 5.8* 5.5*  ALBUMIN 2.7* 2.6*   No results for input(s): LIPASE, AMYLASE in the last 168 hours. No results for input(s): AMMONIA in the last 168 hours. CBC: Recent Labs  Lab 01/12/20 2335 01/13/20 0136 01/13/20 1010  WBC 5.2  --  5.5  HGB 7.5* 6.8* 7.1*  HCT 23.8* 20.0* 23.6*  MCV 93.7  --  96.3  PLT 148*  --  139*    PT/INR: @LABRCNTIP (inr:5) Cardiac Enzymes: )No results for input(s): CKTOTAL, CKMB, CKMBINDEX, TROPONINI in the last 168 hours. CBG: Recent Labs  Lab 01/13/20 0834  GLUCAP 104*    Iron Studies: No results for input(s): IRON, TIBC, TRANSFERRIN, FERRITIN in the last 168 hours.  Xrays/Other Studies: DG Chest 2 View  Result Date: 01/13/2020 CLINICAL DATA:  Shortness of breath EXAM: CHEST - 2 VIEW COMPARISON:  12/24/2019 FINDINGS: Cardiac shadow remains enlarged. Increasing right basilar infiltrate and associated effusion is noted. Small left pleural effusion is noted as well. No acute bony abnormality is noted. IMPRESSION: Bilateral effusions right greater than left with associated right lower lobe infiltrate  increased from the prior exam. Electronically Signed   By: Inez Catalina M.D.   On: 01/13/2020 00:56     Assessment/Plan: 1.  Progressive CKD stage V- underlying CKD is due to DM and HTN and has had a slowly progressive decline in his renal function over the past 7 months.  He has failed outpatient diuretics with multiple hospitalizations for acute on chronic CHF.  He has mild uremic symptoms with anorexia, fatigue, weight loss, and nausea.  I discussed hemodialysis with Mr. Fulco and he states that he would want dialysis if he needed it.  He is a marginal long term dialysis candidate given his multiple co-morbidities, especially his ischemic CMP as well as poor functional and nutritional status. 1. Will consult IR for [facility] placement and initiate a trial of HD to see if he can tolerate it. 2. Will consult [facility] in the am for vein mapping and placement of an AVF or AVG 2. Acute on chronic anemia of CKD stage V- s/p blood transfusion and will need to start ESA.  Follow H/H and transfuse prn 3. Hyperkalemia due to #1- responding to medical management 4. hyperhidrosis- has been seen as an outpatient for this and is longstanding. 5. Acute on chronic combined systolic and diastolic CHF- improved  with IV lasix 6. HTN- stable 7. Adult failure to thrive- pt with multiple admissions in last 7 months and decline in his functional and nutritional status.  He is a marginal dialysis candidate and recommend Palliative care consult to help set goals/limits of care 8. Ischemic CMP- no chest pain.   Governor Rooks Shakina Choy 01/13/2020, 4:29 PM

## 2020-01-13 NOTE — ED Notes (Signed)
Condom cath placed on pt 

## 2020-01-13 NOTE — ED Notes (Signed)
Greg Cutter- Would like pt update- [phone]

## 2020-01-13 NOTE — ED Provider Notes (Signed)
[facility] EMERGENCY DEPARTMENT Provider Note   CSN: [id number] Arrival date & time: 01/12/20  2316     History Chief Complaint  Patient presents with  . Shortness of Breath    Dennis Macias is a 69 y.o. male.  Patient with history of DM, HTN, HLD, CKD, CVA, CHF, presents with SOB that became severe tonight. He states "I've been sweating like a pig".  He also reports generalized headache but cannot give a specific description. He denies chest pain, fever, cough. He reports SOB at rest that worsens with activity. Unable to confirm or deny orthopnea. The patient is a vague historian who has poor insight into his medical condition, what medications he takes, but reports that he takes his medication as prescribed.   The history is provided by the patient. No language interpreter was used.       Past Medical History:  Diagnosis Date  . Anemia   . Arthritis    past hx   . Blindness    right eye  . Cardiorenal syndrome   . Cataract    removed both eyes  . Dehydration   . Diabetes ([place])   . Glaucoma   . History of CVA (cerebrovascular accident) 09/13/2015  . History of stroke 09/13/2015  . History of urinary retention   . Hyperlipidemia   . Hypertension   . NSTEMI (non-ST elevated myocardial infarction) ([place])   . Pernicious anemia 02/24/2018  . S/P TURP   . Stroke [facility])    2017- March  . Syncope 11/2019  . Tachycardia 08/26/2017  . Tubular adenoma of colon 02/2017  . Weight loss, non-intentional 08/26/2017   10 lbs between 6/18 & 2/19    Patient Active Problem List   Diagnosis Date Noted  . Shortness of breath 12/24/2019  . Acute on chronic combined systolic and diastolic CHF (congestive heart failure) ([place]) 12/24/2019  . Non-Obstructive CAD 12/14/2019  . Encephalopathy, metabolic 75/44/9201  . BPH (benign prostatic hyperplasia) 12/08/2019  . Anemia of chronic disease   . Protein-calorie malnutrition, severe 11/19/2019  . Acute ischemic left ICA stroke  ([place])   . Gait disorder   . Chronic combined systolic and diastolic heart failure ([place])   . Non-STEMI (non-ST elevated myocardial infarction) ([place])   . Pleural effusion transudative   . Nephrotic syndrome 08/05/2019  . Protein calorie malnutrition ([place]) 07/31/2019  . Left renal mass 07/21/2019  . CKD (chronic kidney disease) stage 4, GFR 15-29 ml/min (H[facility]) 04/23/2019  . Weight loss, non-intentional 08/26/2017  . 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 Hyperlipidemia 09/13/2015  . Diabetes mellitus with retinopathy of both eyes ([place])   . Essential hypertension     Past Surgical History:  Procedure Laterality Date  . CATARACT EXTRACTION, BILATERAL    . COLONOSCOPY    . IR THORACENTESIS ASP PLEURAL SPACE W/IMG GUIDE  09/21/2019  . IR THORACENTESIS ASP PLEURAL SPACE W/IMG GUIDE  10/16/2019  . LEFT HEART CATH AND CORONARY ANGIOGRAPHY N/A 11/19/2019   Procedure: LEFT HEART CATH AND CORONARY ANGIOGRAPHY;  Surgeon: Jettie Booze, MD;  Location: [facility];  Service: Cardiovascular;  Laterality: N/A;  . POLYPECTOMY    . REFRACTIVE SURGERY  10/2017  . TEE WITHOUT CARDIOVERSION N/A 09/14/2015   Procedure: TRANSESOPHAGEAL ECHOCARDIOGRAM (TEE);  Surgeon: Larey Dresser, MD;  Location: [facility];  Service: Cardiovascular;  Laterality: N/A;  . TRANSURETHRAL RESECTION OF PROSTATE N/A 10/20/2019   Procedure: TRANSURETHRAL RESECTION OF THE PROSTATE (TURP);  Surgeon: Irine Seal, MD;  Location: [facility];  Service: Urology;  Laterality: N/A;  . UPPER GASTROINTESTINAL ENDOSCOPY         Family History  Problem Relation Age of Onset  . Hypertension Mother   . Hyperlipidemia Mother   . Hyperlipidemia Father   . Colon cancer Neg Hx   . Colon polyps Neg Hx   . Esophageal cancer Neg Hx   . Rectal cancer Neg Hx   . Stomach cancer Neg Hx     Social History   Tobacco Use  . Smoking status: Former Research scientist (life sciences)  . Smokeless tobacco: Former Systems developer    Types: Chew    Quit date: 07/09/1978  . Tobacco  comment: quit 1 year ago  Vaping Use  . Vaping Use: Never used  Substance Use Topics  . Alcohol use: No    Alcohol/week: 1.0 standard drink    Types: 1 Cans of beer per week    Comment: quit last march/2017  . Drug use: No    Home Medications Prior to Admission medications   Medication Sig Start Date End Date Taking? Authorizing Provider  Accu-Chek FastClix Lancets MISC Check blood sugar up to 7 times a week as instructed 12/03/19   Angiulli, Lavon Paganini, PA-C  acetaminophen (TYLENOL) 325 MG tablet Take 1-2 tablets (325-650 mg total) by mouth every 4 (four) hours as needed for mild pain. 12/03/19   Angiulli, Lavon Paganini, PA-C  amLODipine (NORVASC) 10 MG tablet Take 1 tablet (10 mg total) by mouth daily. 12/27/19   Seawell, Jaimie A, DO  amLODipine (NORVASC) 10 MG tablet TAKE 1 TABLET(10 MG) BY MOUTH DAILY 12/28/19   [name], MD  aspirin EC 81 MG tablet Take 1 tablet (81 mg total) by mouth daily. 10/22/19   Eugenie Filler, MD  atorvastatin (LIPITOR) 80 MG tablet Take 1 tablet (80 mg total) by mouth at bedtime. IM program 12/03/19   Cathlyn Parsons, PA-C  Blood Glucose Monitoring Suppl (ACCU-CHEK GUIDE) w/Device KIT 1 each by Does not apply route daily. Check blood sugar as instructed up to 7 times a week 12/03/19   Angiulli, Lavon Paganini, PA-C  carvedilol (COREG) 25 MG tablet Take 1 tablet (25 mg total) by mouth 2 (two) times daily with a meal. 01/04/20   [name], MD  ciprofloxacin (CIPRO) 250 MG tablet Take 2 tablets (500 mg total) by mouth daily with breakfast. 12/27/19   Seawell, Jaimie A, DO  clopidogrel (PLAVIX) 75 MG tablet Take 1 tablet (75 mg total) by mouth daily. 12/03/19   Angiulli, Lavon Paganini, PA-C  dorzolamide-timolol (COSOPT) 22.3-6.8 MG/ML ophthalmic solution Place 1 drop into the right eye 2 (two) times daily. 12/03/19   Angiulli, Lavon Paganini, PA-C  finasteride (PROSCAR) 5 MG tablet Take 1 tablet (5 mg total) by mouth daily. 12/03/19   Angiulli, Lavon Paganini, PA-C  furosemide  (LASIX) 80 MG tablet Take 1 tablet (80 mg total) by mouth 2 (two) times daily. 12/15/19   Lucious Groves, DO  glucose blood (ACCU-CHEK GUIDE) test strip Check blood sugar up to 7 times a week as instructed 12/03/19   Angiulli, Lavon Paganini, PA-C  hydrALAZINE (APRESOLINE) 50 MG tablet Take 1 tablet (50 mg total) by mouth every 8 (eight) hours. 12/26/19   Seawell, Jaimie A, DO  hydrALAZINE (APRESOLINE) 50 MG tablet TAKE 1 TABLET(50 MG) BY MOUTH THREE TIMES DAILY 12/28/19   [name], MD  isosorbide mononitrate (IMDUR) 60 MG 24 hr tablet Take 1 tablet (60 mg total) by mouth daily. 12/03/19  01/02/20  Angiulli, Lavon Paganini, PA-C  melatonin 3 MG TABS tablet Take 1 tablet (3 mg total) by mouth at bedtime. 12/03/19   Angiulli, Lavon Paganini, PA-C  tamsulosin (FLOMAX) 0.4 MG CAPS capsule Take 1 capsule (0.4 mg total) by mouth daily after supper. 12/03/19   Angiulli, Lavon Paganini, PA-C  vitamin B-12 (CYANOCOBALAMIN) 1000 MCG tablet Take 1 tablet (1,000 mcg total) by mouth daily. 12/03/19   Angiulli, Lavon Paganini, PA-C    Allergies    Cefepime  Review of Systems   Review of Systems  Constitutional: Negative for chills and fever.  HENT: Negative.   Respiratory: Positive for shortness of breath.   Cardiovascular: Negative.  Negative for chest pain and leg swelling.  Gastrointestinal: Negative.  Negative for abdominal pain and nausea.  Genitourinary: Negative for decreased urine volume.  Musculoskeletal: Negative.   Skin: Negative.   Neurological: Positive for headaches. Negative for weakness and numbness.    Physical Exam Updated Vital Signs BP 128/69   Pulse (!) 52   Temp (!) 94 F (34.4 C) (Rectal)   Resp 18   SpO2 99%   Physical Exam Vitals and nursing note reviewed.  Constitutional:      General: He is not in acute distress.    Appearance: He is well-developed. He is ill-appearing. He is not diaphoretic.  HENT:     Head: Normocephalic.  Cardiovascular:     Rate and Rhythm: Normal rate and regular rhythm.      Heart sounds: No murmur heard.   Pulmonary:     Effort: Pulmonary effort is normal.     Breath sounds: Examination of the right-lower field reveals rales. Examination of the left-lower field reveals rales. Rales present. No wheezing.  Chest:     Chest wall: No tenderness.  Abdominal:     General: Bowel sounds are normal.     Palpations: Abdomen is soft.     Tenderness: There is no abdominal tenderness. There is no guarding or rebound.  Musculoskeletal:        General: Normal range of motion.     Cervical back: Normal range of motion and neck supple.     Right lower leg: No edema.     Left lower leg: No edema.  Skin:    General: Skin is warm and dry.     Coloration: Skin is pale.  Neurological:     General: No focal deficit present.     Mental Status: He is alert and oriented to person, place, and time.     ED Results / Procedures / Treatments   Labs (all labs ordered are listed, but only abnormal results are displayed) Labs Reviewed  BASIC METABOLIC PANEL - Abnormal; Notable for the following components:      Result Value   Potassium 5.7 (*)    Chloride 112 (*)    CO2 15 (*)    Glucose, Bld 190 (*)    BUN 103 (*)    Creatinine, Ser 6.30 (*)    Calcium 8.4 (*)    GFR calc non Af Amer 8 (*)    GFR calc Af Amer 10 (*)    All other components within normal limits  CBC - Abnormal; Notable for the following components:   RBC 2.54 (*)    Hemoglobin 7.5 (*)    HCT 23.8 (*)    RDW 16.6 (*)    Platelets 148 (*)    All other components within normal limits  URINE CULTURE  SARS CORONAVIRUS  2 BY RT PCR (HOSPITAL ORDER, [facility])  HEPATIC FUNCTION PANEL  BRAIN NATRIURETIC PEPTIDE  BLOOD GAS, ARTERIAL  URINALYSIS, ROUTINE W REFLEX MICROSCOPIC  TROPONIN I (HIGH SENSITIVITY)    EKG None  Radiology DG Chest 2 View  Result Date: 01/13/2020 CLINICAL DATA:  Shortness of breath EXAM: CHEST - 2 VIEW COMPARISON:  12/24/2019 FINDINGS: Cardiac  shadow remains enlarged. Increasing right basilar infiltrate and associated effusion is noted. Small left pleural effusion is noted as well. No acute bony abnormality is noted. IMPRESSION: Bilateral effusions right greater than left with associated right lower lobe infiltrate increased from the prior exam. Electronically Signed   By: Inez Catalina M.D.   On: 01/13/2020 00:56    Procedures Procedures (including critical care time)  Medications Ordered in ED Medications  sodium chloride flush (NS) 0.9 % injection 3 mL (has no administration in time range)    ED Course  I have reviewed the triage vital signs and the nursing notes.  Pertinent labs & imaging results that were available during my care of the patient were reviewed by me and considered in my medical decision making (see chart for details).    MDM Rules/Calculators/A&P                          Ill appearing patient to ED with SOB, generalized HA, as further detailed in HPI.   Chart reviewed. The patient was admitted 6/17 with similar symptoms but with an oxygen requirement and hypertensive emergency, which he does not have on presentation tonight. He was found to have significantly elevated troponins, ultimately attributed to demand ischemia in the setting of CHF and poor renal function, and medically managed. He also had urosepsis that was culture positive for pseudomonas, and acute on chronic CHF. He has known kidney disease but has not had outpatient nephrology follow up since last admission.   On arrival O2 saturations in the 90's and the patient is not in any respiratory distress. He denies pain other than c/o HA - no chest pain. Reports "sweating" but no diaphoresis/sweating observed now.   Labs show worsening renal function (cr 5.11 6/19, 6.30 tonight. Per note of Dr. Justin Mend 6/17 baseline cr referred to as 3.5), K+5.7, CXR with bilateral effusions R>L. No peripheral edema. ABG is essentially unremarkable.   The patient will  require readmission for worsening renal function, new onset pleural effusions. He is currently stable without need for emergent dialysis. Internal medicine paged for admission, Dr. Myrtie Hawk accepting.       Final Clinical Impression(s) / ED Diagnoses Final diagnoses:  None   1. Acute on chronic renal failure 2. Dyspnea  Rx / DC Orders ED Discharge Orders    None       Charlann Lange, PA-C 01/13/20 0443    Mesner, Corene Cornea, MD 01/13/20 [phone]

## 2020-01-13 NOTE — Progress Notes (Signed)
Subjective: Patient reports that he was having a sweating spell and that was his main concern. He reports that when he takes his medications that it can cause sweating and any time he eats it also causes sweating. He reports that his breathing problems occurs every once in a while when he lays down in the bed. He reports that he has urinated about twice since he has been here. He also reports that his feet are hurting a lot. He reports taking his "water pills" twice a day as prescribed.   Objective:  Vital signs in last 24 hours: Vitals:   01/13/20 0600 01/13/20 0645 01/13/20 0708 01/13/20 0815  BP: 136/65 138/70 128/80 130/74  Pulse: 64 63 63 64  Resp: 13 20 14 16   Temp:      TempSrc:      SpO2: 98% 98% 97% 97%   Physical Exam Vitals and nursing note reviewed.  Constitutional:      General: He is not in acute distress.    Appearance: He is diaphoretic.  HENT:     Head: Normocephalic and atraumatic.  Cardiovascular:     Rate and Rhythm: Normal rate and regular rhythm.     Pulses:          Radial pulses are 2+ on the right side and 2+ on the left side.       Dorsalis pedis pulses are 2+ on the right side and 2+ on the left side.     Heart sounds: Normal heart sounds, S1 normal and S2 normal.  Pulmonary:     Effort: Pulmonary effort is normal. No tachypnea or respiratory distress.     Breath sounds: Normal breath sounds. No stridor. No wheezing.  Abdominal:     General: Abdomen is flat. There is no distension.     Palpations: Abdomen is soft. There is no mass.     Tenderness: There is no abdominal tenderness.  Musculoskeletal:     Right lower leg: No edema.     Left lower leg: No edema.  Neurological:     Mental Status: He is alert. Mental status is at baseline.      Assessment/Plan: Dennis Macias is a 69yr old male with PMHx of DM, HTN, HLD, CKD, CVA, CHF, with a recent hospitalization for HT emergency and HF exacerbation on 6/18   Active Problems:   Acute-on-chronic  kidney injury ([place])   Acute on Chronic Renal Failure On last admission (6/18) for CHF exacerbation his BNP was +4000, due to his current BNP of 705.7 it is more likely this is the main problem. Will hold off on nephrology consult for now. Will follow up outpatient. Fluid overload has resolved with 1 dose IV lasix 80 mg. -Follow up with Nephrology outpatient\ -Monitor for signs of overload   Anemia: Hemoglobin today is 6.8 due to him being symptomatic will transfuse. -transfuse 1 unit RBC   Dyspnea: Girlfriend has told patient that he has had episodes of SOB. Patient reports occasional SOB when sitting in bed. Was given IV lasix 80 mg. Breathing improved. -Continue to monitor   Hyperhidrosis: Has history of sweating that has been followed by outpatient clinic. He has had multiple referrals to Dermatology but has not attended them. -Refer to Dermatology and attend appointment   Hyperkalemia: Current lab of 5.7. -Will recheck later today to reassess   Combined Heart Failure: On last admission (6/18) for CHF exacerbation his BNP was +4000, due to his current BNP of 705.7 it is  unlikely this episode is being driven by his HF. -Continue to monitor  -Continue Coreg 25mg  BID   HTN: Currently on hydralazine and Coreg, holding amlodipine for now -Continue hydralazine 50mg  q8 -Continue Coreg 25mg  BID -Monitor blood pressure may add amlodipine if needed   HLD: Continue home regiment -Continue Lipitor 80mg  daily   Diet: HH w/ fluid restriction IVF: none DVT PPX: subQ heparin 5000u Code Status: Full   Prior to Admission Living Arrangement: home Anticipated Discharge Location: home Barriers to Discharge: hyperkalemia, anemia Dispo: Anticipated discharge in approximately 0-1 day(s).   Dennis Cedar, MD 01/13/2020, 8:39 AM Pager: [phone] After 5pm on weekdays and 1pm on weekends: On Call pager [phone]

## 2020-01-13 NOTE — Plan of Care (Signed)
  Problem: Education: Goal: Knowledge of General Education information will improve Description: Including pain rating scale, medication(s)/side effects and non-pharmacologic comfort measures Outcome: Progressing   Problem: Clinical Measurements: Goal: Respiratory complications will improve Outcome: Progressing Note: On room air   Problem: Nutrition: Goal: Adequate nutrition will be maintained Outcome: Progressing   Problem: Coping: Goal: Level of anxiety will decrease Outcome: Progressing   Problem: Elimination: Goal: Will not experience complications related to urinary retention Outcome: Progressing   Problem: Pain Managment: Goal: General experience of comfort will improve Outcome: Progressing Note: No complaints of pain   Problem: Safety: Goal: Ability to remain free from injury will improve Outcome: Progressing   Problem: Skin Integrity: Goal: Risk for impaired skin integrity will decrease Outcome: Progressing

## 2020-01-14 ENCOUNTER — Inpatient Hospital Stay (HOSPITAL_COMMUNITY): Payer: Medicare PPO

## 2020-01-14 ENCOUNTER — Ambulatory Visit: Payer: Medicare PPO | Admitting: Interventional Cardiology

## 2020-01-14 ENCOUNTER — Encounter (HOSPITAL_COMMUNITY): Payer: Self-pay | Admitting: Internal Medicine

## 2020-01-14 DIAGNOSIS — N185 Chronic kidney disease, stage 5: Secondary | ICD-10-CM

## 2020-01-14 DIAGNOSIS — Z7189 Other specified counseling: Secondary | ICD-10-CM

## 2020-01-14 DIAGNOSIS — I5042 Chronic combined systolic (congestive) and diastolic (congestive) heart failure: Secondary | ICD-10-CM

## 2020-01-14 DIAGNOSIS — N186 End stage renal disease: Secondary | ICD-10-CM

## 2020-01-14 HISTORY — PX: IR FLUORO GUIDE CV LINE RIGHT: IMG2283

## 2020-01-14 LAB — BASIC METABOLIC PANEL
Anion gap: 14 (ref 5–15)
BUN: 101 mg/dL — ABNORMAL HIGH (ref 8–23)
CO2: 16 mmol/L — ABNORMAL LOW (ref 22–32)
Calcium: 8.3 mg/dL — ABNORMAL LOW (ref 8.9–10.3)
Chloride: 112 mmol/L — ABNORMAL HIGH (ref 98–111)
Creatinine, Ser: 6.54 mg/dL — ABNORMAL HIGH (ref 0.61–1.24)
GFR calc Af Amer: 9 mL/min — ABNORMAL LOW (ref >60)
GFR calc non Af Amer: 8 mL/min — ABNORMAL LOW (ref >60)
Glucose, Bld: 83 mg/dL (ref 70–99)
Potassium: 5.2 mmol/L — ABNORMAL HIGH (ref 3.5–5.1)
Sodium: 142 mmol/L (ref 135–145)

## 2020-01-14 LAB — TYPE AND SCREEN
ABO/RH(D): AB POS
Antibody Screen: NEGATIVE
Unit division: 0

## 2020-01-14 LAB — URINE CULTURE: Culture: 10000 — AB

## 2020-01-14 LAB — CBC
HCT: 26 % — ABNORMAL LOW (ref 39.0–52.0)
Hemoglobin: 8.2 g/dL — ABNORMAL LOW (ref 13.0–17.0)
MCH: 29.4 pg (ref 26.0–34.0)
MCHC: 31.5 g/dL (ref 30.0–36.0)
MCV: 93.2 fL (ref 80.0–100.0)
Platelets: 141 10*3/uL — ABNORMAL LOW (ref 150–400)
RBC: 2.79 MIL/uL — ABNORMAL LOW (ref 4.22–5.81)
RDW: 16.2 % — ABNORMAL HIGH (ref 11.5–15.5)
WBC: 6.4 10*3/uL (ref 4.0–10.5)
nRBC: 0 % (ref 0.0–0.2)

## 2020-01-14 LAB — BPAM RBC
Blood Product Expiration Date: 202108052359
ISSUE DATE / TIME: 202107071145
Unit Type and Rh: 8400

## 2020-01-14 LAB — HEPATITIS B SURFACE ANTIGEN: Hepatitis B Surface Ag: NONREACTIVE

## 2020-01-14 LAB — GLUCOSE, CAPILLARY
Glucose-Capillary: 73 mg/dL (ref 70–99)
Glucose-Capillary: 77 mg/dL (ref 70–99)

## 2020-01-14 LAB — PROTIME-INR
INR: 1.9 — ABNORMAL HIGH (ref 0.8–1.2)
Prothrombin Time: 21.1 seconds — ABNORMAL HIGH (ref 11.4–15.2)

## 2020-01-14 MED ORDER — LIDOCAINE-EPINEPHRINE 1 %-1:100000 IJ SOLN
INTRAMUSCULAR | Status: AC
  Filled 2020-01-14: qty 1

## 2020-01-14 MED ORDER — LIDOCAINE HCL (PF) 1 % IJ SOLN: 5.0000 mL | INTRAMUSCULAR | Status: DC | PRN

## 2020-01-14 MED ORDER — SODIUM CHLORIDE 0.9 % IV SOLN: 100.0000 mL | INTRAVENOUS | Status: DC | PRN

## 2020-01-14 MED ORDER — ALTEPLASE 2 MG IJ SOLR: 2.0000 mg | Freq: Once | INTRAMUSCULAR | Status: DC | PRN

## 2020-01-14 MED ORDER — LIDOCAINE-PRILOCAINE 2.5-2.5 % EX CREA: 1.0000 "application " | TOPICAL_CREAM | CUTANEOUS | Status: DC | PRN

## 2020-01-14 MED ORDER — HEPARIN SODIUM (PORCINE) 5000 UNIT/ML IJ SOLN
5000.0000 [IU] | Freq: Three times a day (TID) | INTRAMUSCULAR | Status: DC
  Administered 2020-01-15 – 2020-01-20 (×13): 5000 [IU] via SUBCUTANEOUS
  Filled 2020-01-14 (×15): qty 1

## 2020-01-14 MED ORDER — FENTANYL CITRATE (PF) 100 MCG/2ML IJ SOLN
INTRAMUSCULAR | Status: AC | PRN
  Administered 2020-01-14: 25 ug via INTRAVENOUS

## 2020-01-14 MED ORDER — PENTAFLUOROPROP-TETRAFLUOROETH EX AERO: 1.0000 "application " | INHALATION_SPRAY | CUTANEOUS | Status: DC | PRN

## 2020-01-14 MED ORDER — CHLORHEXIDINE GLUCONATE CLOTH 2 % EX PADS
6.0000 | MEDICATED_PAD | Freq: Every day | CUTANEOUS | Status: DC
  Administered 2020-01-15 – 2020-01-21 (×6): 6 via TOPICAL

## 2020-01-14 MED ORDER — MIDAZOLAM HCL 2 MG/2ML IJ SOLN
INTRAMUSCULAR | Status: AC
  Filled 2020-01-14: qty 2

## 2020-01-14 MED ORDER — GELATIN ABSORBABLE 12-7 MM EX MISC
CUTANEOUS | Status: AC
  Filled 2020-01-14: qty 1

## 2020-01-14 MED ORDER — FENTANYL CITRATE (PF) 100 MCG/2ML IJ SOLN
INTRAMUSCULAR | Status: AC
  Filled 2020-01-14: qty 2

## 2020-01-14 MED ORDER — MIDAZOLAM HCL 2 MG/2ML IJ SOLN
INTRAMUSCULAR | Status: AC | PRN
  Administered 2020-01-14: 1 mg via INTRAVENOUS

## 2020-01-14 MED ORDER — HEPARIN SODIUM (PORCINE) 1000 UNIT/ML DIALYSIS: 1000.0000 [IU] | INTRAMUSCULAR | Status: DC | PRN

## 2020-01-14 MED ORDER — LIDOCAINE HCL (PF) 1 % IJ SOLN
INTRAMUSCULAR | Status: AC | PRN
  Administered 2020-01-14: 5 mL

## 2020-01-14 MED ORDER — HEPARIN SODIUM (PORCINE) 1000 UNIT/ML IJ SOLN
INTRAMUSCULAR | Status: AC
  Administered 2020-01-14: 3.8 mL
  Filled 2020-01-14: qty 1

## 2020-01-14 NOTE — Consult Note (Signed)
   [facility] Inpatient Consult   01/14/2020  Kadarrius Yanke 09-Dec-1950 [id number]  [place] patient:  [employer] P[employer]; [facility]  Patient was screened for [facility] services. Patient will have the transition of care call conducted by the primary care provider. This patient is also in an Embedded practice which has a chronic disease management Embedded Care Management team.  Patient is in their Chronic Care Management program for hypertension.  Plan: Notification sent to the [facility] and made aware of patient admission.  Please contact for further questions,  Natividad Brood, RN BSN [facility] Liaison  [phone] business mobile phone Toll free office [phone]  Fax number: [phone] [email or web address]

## 2020-01-14 NOTE — Progress Notes (Signed)
PALLIATIVE NOTE:  Referral received for goals of care discussion. Chart reviewed and updates received from RN. Patient is off the unit for HD cath placement. Per RN plans are for HD after placement.   Will re-attempt to see patient tomorrow for [place].   Detailed note and recommendations to follow.   Thank you for your referral and allowing Palliative to assist in the care of Mr. [place].   Alda Lea, AGPCNP-BC Palliative Medicine Team  Phone: [phone] Pager: [phone] Amion: N. [place]   NO CHARGE

## 2020-01-14 NOTE — Progress Notes (Signed)
Bilateral upper extremity venous mapping has been completed. Preliminary results can be found in CV Proc through chart review.   01/14/20 4:23 PM Dennis Macias RVT

## 2020-01-14 NOTE — Consult Note (Addendum)
Hospital Consult    Reason for Consult:  Permanent dialysis access Requesting Physician:   MRN #:  [id number]  History of Present Illness: This is a 69 y.o. male with medical history significant for Diabetes mellitus, HTN, CVA, CAD, ischemic CMP, chronic systolic and diastolic CHF, nephrotic syndrome due to diabetic nephropathy, anemia of CKD and progressive CKD stage V now requiring hemodialysis. He states that he has never had any central lines or previous access before. He is right handed.  Vascular has been asked to evaluate him for placement of permanent dialysis access  Past Medical History:  Diagnosis Date  . Anemia   . Arthritis    past hx   . Blindness    right eye  . Cardiorenal syndrome   . Cataract    removed both eyes  . Dehydration   . Diabetes ([place])   . Glaucoma   . History of CVA (cerebrovascular accident) 09/13/2015  . History of stroke 09/13/2015  . History of urinary retention   . Hyperlipidemia   . Hypertension   . NSTEMI (non-ST elevated myocardial infarction) ([place])   . Pernicious anemia 02/24/2018  . S/P TURP   . Stroke [facility])    2017- March  . Syncope 11/2019  . Tachycardia 08/26/2017  . Tubular adenoma of colon 02/2017  . Weight loss, non-intentional 08/26/2017   10 lbs between 6/18 & 2/19    Past Surgical History:  Procedure Laterality Date  . CATARACT EXTRACTION, BILATERAL    . COLONOSCOPY    . IR THORACENTESIS ASP PLEURAL SPACE W/IMG GUIDE  09/21/2019  . IR THORACENTESIS ASP PLEURAL SPACE W/IMG GUIDE  10/16/2019  . LEFT HEART CATH AND CORONARY ANGIOGRAPHY N/A 11/19/2019   Procedure: LEFT HEART CATH AND CORONARY ANGIOGRAPHY;  Surgeon: Jettie Booze, MD;  Location: [place] [facility];  Service: Cardiovascular;  Laterality: N/A;  . POLYPECTOMY    . REFRACTIVE SURGERY  10/2017  . TEE WITHOUT CARDIOVERSION N/A 09/14/2015   Procedure: TRANSESOPHAGEAL ECHOCARDIOGRAM (TEE);  Surgeon: Larey Dresser, MD;  Location: [place];  Service:  Cardiovascular;  Laterality: N/A;  . TRANSURETHRAL RESECTION OF PROSTATE N/A 10/20/2019   Procedure: TRANSURETHRAL RESECTION OF THE PROSTATE (TURP);  Surgeon: Irine Seal, MD;  Location: [facility];  Service: Urology;  Laterality: N/A;  . UPPER GASTROINTESTINAL ENDOSCOPY      Allergies  Allergen Reactions  . Cefepime Other (See Comments)    Pt had BAD encephalopathy from Cefepime    Prior to Admission medications   Medication Sig Start Date End Date Taking? Authorizing Provider  acetaminophen (TYLENOL) 325 MG tablet Take 1-2 tablets (325-650 mg total) by mouth every 4 (four) hours as needed for mild pain. 12/03/19  Yes Angiulli, Lavon Paganini, PA-C  amLODipine (NORVASC) 10 MG tablet Take 1 tablet (10 mg total) by mouth daily. 12/27/19  Yes Seawell, Jaimie A, DO  aspirin EC 81 MG tablet Take 1 tablet (81 mg total) by mouth daily. 10/22/19  Yes Eugenie Filler, MD  atorvastatin (LIPITOR) 80 MG tablet Take 1 tablet (80 mg total) by mouth at bedtime. IM program 12/03/19  Yes Angiulli, Lavon Paganini, PA-C  carvedilol (COREG) 25 MG tablet Take 1 tablet (25 mg total) by mouth 2 (two) times daily with a meal. 01/04/20  Yes [name], Rylee, MD  clopidogrel (PLAVIX) 75 MG tablet Take 1 tablet (75 mg total) by mouth daily. 12/03/19  Yes Angiulli, Lavon Paganini, PA-C  dorzolamide-timolol (COSOPT) 22.3-6.8 MG/ML ophthalmic solution Place 1 drop into the right  eye 2 (two) times daily. 12/03/19  Yes Angiulli, Lavon Paganini, PA-C  finasteride (PROSCAR) 5 MG tablet Take 1 tablet (5 mg total) by mouth daily. 12/03/19  Yes Angiulli, Lavon Paganini, PA-C  furosemide (LASIX) 80 MG tablet Take 1 tablet (80 mg total) by mouth 2 (two) times daily. 12/15/19  Yes Lucious Groves, DO  hydrALAZINE (APRESOLINE) 50 MG tablet Take 1 tablet (50 mg total) by mouth every 8 (eight) hours. 12/26/19  Yes Seawell, Jaimie A, DO  isosorbide mononitrate (IMDUR) 60 MG 24 hr tablet Take 1 tablet (60 mg total) by mouth daily. 12/03/19 01/13/20 Yes Angiulli, Lavon Paganini, PA-C    tamsulosin (FLOMAX) 0.4 MG CAPS capsule Take 1 capsule (0.4 mg total) by mouth daily after supper. 12/03/19  Yes Angiulli, Lavon Paganini, PA-C  vitamin B-12 (CYANOCOBALAMIN) 1000 MCG tablet Take 1 tablet (1,000 mcg total) by mouth daily. 12/03/19  Yes Angiulli, Lavon Paganini, PA-C  Accu-Chek FastClix Lancets MISC Check blood sugar up to 7 times a week as instructed 12/03/19   Angiulli, Lavon Paganini, PA-C  amLODipine (NORVASC) 10 MG tablet TAKE 1 TABLET(10 MG) BY MOUTH DAILY Patient not taking: Reported on 01/13/2020 12/28/19   [name], MD  Blood Glucose Monitoring Suppl (ACCU-CHEK GUIDE) w/Device KIT 1 each by Does not apply route daily. Check blood sugar as instructed up to 7 times a week 12/03/19   Angiulli, Lavon Paganini, PA-C  ciprofloxacin (CIPRO) 250 MG tablet Take 2 tablets (500 mg total) by mouth daily with breakfast. Patient not taking: Reported on 01/13/2020 12/27/19   Molli Hazard A, DO  glucose blood (ACCU-CHEK GUIDE) test strip Check blood sugar up to 7 times a week as instructed 12/03/19   Angiulli, Lavon Paganini, PA-C  hydrALAZINE (APRESOLINE) 50 MG tablet TAKE 1 TABLET(50 MG) BY MOUTH THREE TIMES DAILY Patient not taking: Reported on 01/13/2020 12/28/19   [name], MD  melatonin 3 MG TABS tablet Take 1 tablet (3 mg total) by mouth at bedtime. Patient not taking: Reported on 01/13/2020 12/03/19   Angiulli, Lavon Paganini, PA-C    Social History   Socioeconomic History  . Marital status: Married    Spouse name: Not on file  . Number of children: Not on file  . Years of education: Not on file  . Highest education level: Not on file  Occupational History  . Not on file  Tobacco Use  . Smoking status: Former Research scientist (life sciences)  . Smokeless tobacco: Former Systems developer    Types: Chew    Quit date: 07/09/1978  . Tobacco comment: quit 1 year ago  Vaping Use  . Vaping Use: Never used  Substance and Sexual Activity  . Alcohol use: No    Alcohol/week: 1.0 standard drink    Types: 1 Cans of beer per week    Comment: quit  last march/2017  . Drug use: No  . Sexual activity: Not on file  Other Topics Concern  . Not on file  Social History Narrative  . Not on file   Social Determinants of Health   Financial Resource Strain:   . Difficulty of Paying Living Expenses:   Food Insecurity:   . Worried About Charity fundraiser in the Last Year:   . Arboriculturist in the Last Year:   Transportation Needs:   . Film/video editor (Medical):   Marland Kitchen Lack of Transportation (Non-Medical):   Physical Activity:   . Days of Exercise per Week:   . Minutes of Exercise per Session:  Stress:   . Feeling of Stress :   Social Connections:   . Frequency of Communication with Friends and Family:   . Frequency of Social Gatherings with Friends and Family:   . Attends Religious Services:   . Active Member of Clubs or Organizations:   . Attends Archivist Meetings:   Marland Kitchen Marital Status:   Intimate Partner Violence:   . Fear of Current or Ex-Partner:   . Emotionally Abused:   Marland Kitchen Physically Abused:   . Sexually Abused:     Family History  Problem Relation Age of Onset  . Hypertension Mother   . Hyperlipidemia Mother   . Hyperlipidemia Father   . Colon cancer Neg Hx   . Colon polyps Neg Hx   . Esophageal cancer Neg Hx   . Rectal cancer Neg Hx   . Stomach cancer Neg Hx     ROS: Otherwise negative unless mentioned in HPI  Physical Examination  Vitals:   01/14/20 0329 01/14/20 0808  BP: 127/66 131/65  Pulse: 64 67  Resp:  18  Temp: 98.5 F (36.9 C) 98.7 F (37.1 C)  SpO2: 97% 98%   Body mass index is 22.81 kg/m.  General:  WDWN in NAD Gait: Not observed HENT: WNL, normocephalic Pulmonary: normal non-labored breathing Cardiac: regular Abdomen: *soft, NT/ND, no masses Vascular Exam/Pulses: 1+ bilateral radial pulses, unable to palpate ulnar pulses bilaterally. 2+ brachial pulses bilaterally. Bilateral hands warm. 5/5 grip strength Extremities: without ischemic changes, without Gangrene ,  without cellulitis; without open wounds;  Musculoskeletal: no muscle wasting or atrophy  Neurologic: A&O X 3;  No focal weakness or paresthesias are detected; speech is fluent/normal Psychiatric:  The pt has Normal affect.   CBC    Component Value Date/Time   WBC 6.4 01/14/2020 0312   RBC 2.79 (L) 01/14/2020 0312   HGB 8.2 (L) 01/14/2020 0312   HGB 9.6 (L) 12/10/2019 1439   HCT 26.0 (L) 01/14/2020 0312   HCT 28.4 (L) 12/10/2019 1439   PLT 141 (L) 01/14/2020 0312   PLT 285 12/10/2019 1439   MCV 93.2 01/14/2020 0312   MCV 89 12/10/2019 1439   MCH 29.4 01/14/2020 0312   MCHC 31.5 01/14/2020 0312   RDW 16.2 (H) 01/14/2020 0312   RDW 14.5 12/10/2019 1439   LYMPHSABS 1.1 12/24/2019 0852   LYMPHSABS 2.0 02/24/2018 1142   MONOABS 0.4 12/24/2019 0852   EOSABS 0.0 12/24/2019 0852   EOSABS 0.1 02/24/2018 1142   BASOSABS 0.0 12/24/2019 0852   BASOSABS 0.0 02/24/2018 1142    BMET    Component Value Date/Time   NA 142 01/14/2020 0312   NA 137 12/10/2019 1439   K 5.2 (H) 01/14/2020 0312   CL 112 (H) 01/14/2020 0312   CO2 16 (L) 01/14/2020 0312   GLUCOSE 83 01/14/2020 0312   BUN 101 (H) 01/14/2020 0312   BUN 80 (HH) 12/10/2019 1439   CREATININE 6.54 (H) 01/14/2020 0312   CALCIUM 8.3 (L) 01/14/2020 0312   GFRNONAA 8 (L) 01/14/2020 0312   GFRAA 9 (L) 01/14/2020 0312    COAGS: Lab Results  Component Value Date   INR 1.9 (H) 01/14/2020   INR 1.5 (H) 11/14/2019   INR 1.5 (H) 10/15/2019    Statin:  Yes.   Beta Blocker:  Yes.   Aspirin:  Yes.   ACEI:  No. ARB:  No. CCB use:  Yes Other antiplatelets/anticoagulants:  Yes, Plavix   ASSESSMENT/PLAN: This is a 69 y.o. male with multiple  medical co morbidities with acute on chronic kidney disease now requiring hemodialysis. He is having TDC placed by IR today. Pending vein mapping of bilateral upper extremities. He is right handed.  Plan will be AV graft vs AV fistula. He is on Aspirin and Plavix. On call vascular surgeon Dr.  Donzetta Matters will see and evaluate the patient later today and follow up with further surgical planning   Karoline Caldwell PA-C [facility] [phone] 01/14/2020  10:59 AM   I have independently interviewed and examined patient and agree with PA assessment and plan above.  Patient appears to have limited understanding of dialysis in general.  I discussed with him his options.  Appears that his best chance for single stage fistula would be a right arm cephalic vein fistula.  He does have an IV in his forearm which can stay for now but will need to be removed prior to the procedure.  We will otherwise restrict his right upper extremity and plan for fistula versus graft in his right upper extremity early next week.  INR will need to be improved from 1.9 prior to procedure.   Junella Domke C. Donzetta Matters, MD [facility] [place] Office: [phone] Pager: [phone]

## 2020-01-14 NOTE — Progress Notes (Addendum)
Subjective: Interviewed patient at bedside. He stated that he was feeling ok. He stated that Nephrology had come by and that he was going to undergo port placement today for dialysis.  We asked the patient if he understood what dialysis entailed. We discussed the commitment that dialysis required and discussed the outcomes if he did not go on dialysis. He said he just wanted it done so he could get out of the hospital and stop having to come back all the time. We felt a Palliative Care consult would be appropriate to ensure all options where understood by patient.  Objective:  Vital signs in last 24 hours: Vitals:   01/13/20 1532 01/13/20 1948 01/14/20 0329 01/14/20 0500  BP:  119/61 127/66   Pulse:  63 64   Resp:  16    Temp:  98.4 F (36.9 C) 98.5 F (36.9 C)   TempSrc:  Oral Oral   SpO2:  96% 97%   Weight: 76.2 kg   76.3 kg  Height: 6' (1.829 m)      Physical Exam Vitals and nursing note reviewed.  Constitutional:      General: He is not in acute distress.    Appearance: He is not ill-appearing or toxic-appearing.  HENT:     Head: Normocephalic and atraumatic.  Eyes:     Extraocular Movements: Extraocular movements intact.  Cardiovascular:     Rate and Rhythm: Normal rate and regular rhythm.     Pulses:          Radial pulses are 2+ on the right side and 2+ on the left side.       Dorsalis pedis pulses are 2+ on the right side and 2+ on the left side.     Heart sounds: Normal heart sounds, S1 normal and S2 normal. No murmur heard.   Pulmonary:     Effort: Pulmonary effort is normal. No tachypnea.     Breath sounds: Normal breath sounds.  Abdominal:     General: Abdomen is flat. There is no distension.     Palpations: Abdomen is soft. There is no mass.     Tenderness: There is no abdominal tenderness.  Musculoskeletal:        General: No tenderness.     Right lower leg: No edema.     Left lower leg: No edema.  Neurological:     Mental Status: Mental status is at  baseline.      Assessment/Plan: [name] is a 70yr old male with PMHx of DM, HTN, HLD, CKD, CVA, CHF, with a recent hospitalization for HT emergency and HF exacerbation on 6/18  Active Problems:   Acute on chronic anemia   Acute-on-chronic kidney injury ([place])   Hyperkalemia  Progressive CKD V: Nephrology has recommended the start of dialysis. He will have a port put in today and will begin dialysis while in hospital. Fluid overload has resolved with 1 dose IV lasix 80 mg. -Nephrology has recommended dialysis -Will have a port placed today, dialysis will start soon after -Will have vascular follow up for permanent dialysis access     Hyperkalemia: Given lokelma yesterday, current lab of 5.2. -Is going to undergo dialysis -Continue to monitor until dialysis session.   Anemia of Chronic Disease secondary to ESRD: Hemoglobin today is 8.2 post transfusion. -continue to trend hem during hospital stay     Chronic Combined Heart Failure: Not in exacerbation, well compensated -Continue to monitor  -Continue Coreg 25mg  BID   Hyperhidrosis: Has  history of sweating that has been followed by outpatient clinic. He has had multiple referrals to Dermatology but has not attended them. -Refer to Dermatology and attend appointment   HTN: Currently on hydralazine and Coreg, holding amlodipine for now -Continue hydralazine 50mg  q8 -Continue Coreg 25mg  BID -Monitor blood pressure may add amlodipine if needed     HLD: Continue home regiment -Continue Lipitor 80mg  daily     Diet: HH w/ fluid restriction IVF: none DVT PPX: subQ heparin 5000u Code Status: Full     Prior to Admission Living Arrangement: home Anticipated Discharge Location: home Barriers to Discharge: hyperkalemia, anemia Dispo: Anticipated discharge pending initiation of dialysis.   Briant Cedar, MD 01/14/2020, 6:15 AM Pager: [phone] After 5pm on weekdays and 1pm on weekends: On Call pager [phone]

## 2020-01-14 NOTE — Plan of Care (Signed)
  Problem: Education: Goal: Knowledge of General Education information will improve Description: Including pain rating scale, medication(s)/side effects and non-pharmacologic comfort measures Outcome: Progressing   Problem: Clinical Measurements: Goal: Respiratory complications will improve Outcome: Progressing Note: On room air   Problem: Nutrition: Goal: Adequate nutrition will be maintained Outcome: Progressing   Problem: Coping: Goal: Level of anxiety will decrease Outcome: Progressing   Problem: Elimination: Goal: Will not experience complications related to urinary retention Outcome: Progressing Note: Condom catheter in place   Problem: Pain Managment: Goal: General experience of comfort will improve Outcome: Progressing Note: No complaints of pain this shift   Problem: Safety: Goal: Ability to remain free from injury will improve Outcome: Progressing

## 2020-01-14 NOTE — H&P (Signed)
Chief Complaint: Progressive kidney disease  Referring Physician(s): Donato Heinz  Supervising Physician: Markus Daft  Patient Status: Dennis Macias  History of Present Illness: Dennis Macias is a 69 y.o. male extensive PMH significant for DM, HTN, CVA, CAD, ischemic CMP (EF 35-40%, grade II DD, normal RV function), chronic systolic and diastolic CHF, nephrotic syndrome due to diabetic nephropathy, anemia and progressive CKD stage V.  He was brought to [facility] ED via EMS with worsening SOB over the previous 2 days.   In the ED, K was 5.7, BUN 103, Cr 6.3, BNP 705.7, Hgb of 6.8, and CXR with R>L pleural effusions.    He was admitted for IV diuresis.  His creatinine has steadily increased from 3.7 in March to 6.2 yesterday.  We are asked to place a tunneled HD catheter.  His INR is 1.9 but he does not take warfarin or Eliquis. He takes Plavix.  Past Medical History:  Diagnosis Date  . Anemia   . Arthritis    past hx   . Blindness    right eye  . Cardiorenal syndrome   . Cataract    removed both eyes  . Dehydration   . Diabetes ([place])   . Glaucoma   . History of CVA (cerebrovascular accident) 09/13/2015  . History of stroke 09/13/2015  . History of urinary retention   . Hyperlipidemia   . Hypertension   . NSTEMI (non-ST elevated myocardial infarction) ([place])   . Pernicious anemia 02/24/2018  . S/P TURP   . Stroke [facility])    2017- March  . Syncope 11/2019  . Tachycardia 08/26/2017  . Tubular adenoma of colon 02/2017  . Weight loss, non-intentional 08/26/2017   10 lbs between 6/18 & 2/19    Past Surgical History:  Procedure Laterality Date  . CATARACT EXTRACTION, BILATERAL    . COLONOSCOPY    . IR THORACENTESIS ASP PLEURAL SPACE W/IMG GUIDE  09/21/2019  . IR THORACENTESIS ASP PLEURAL SPACE W/IMG GUIDE  10/16/2019  . LEFT HEART CATH AND CORONARY ANGIOGRAPHY N/A 11/19/2019   Procedure: LEFT HEART CATH AND CORONARY ANGIOGRAPHY;  Surgeon: Jettie Booze, MD;  Location:  [place] [facility];  Service: Cardiovascular;  Laterality: N/A;  . POLYPECTOMY    . REFRACTIVE SURGERY  10/2017  . TEE WITHOUT CARDIOVERSION N/A 09/14/2015   Procedure: TRANSESOPHAGEAL ECHOCARDIOGRAM (TEE);  Surgeon: Larey Dresser, MD;  Location: [place];  Service: Cardiovascular;  Laterality: N/A;  . TRANSURETHRAL RESECTION OF PROSTATE N/A 10/20/2019   Procedure: TRANSURETHRAL RESECTION OF THE PROSTATE (TURP);  Surgeon: Irine Seal, MD;  Location: [facility];  Service: Urology;  Laterality: N/A;  . UPPER GASTROINTESTINAL ENDOSCOPY      Allergies: Cefepime  Medications: Prior to Admission medications   Medication Sig Start Date End Date Taking? Authorizing Provider  acetaminophen (TYLENOL) 325 MG tablet Take 1-2 tablets (325-650 mg total) by mouth every 4 (four) hours as needed for mild pain. 12/03/19  Yes Angiulli, Lavon Paganini, PA-C  amLODipine (NORVASC) 10 MG tablet Take 1 tablet (10 mg total) by mouth daily. 12/27/19  Yes Seawell, Jaimie A, DO  aspirin EC 81 MG tablet Take 1 tablet (81 mg total) by mouth daily. 10/22/19  Yes Eugenie Filler, MD  atorvastatin (LIPITOR) 80 MG tablet Take 1 tablet (80 mg total) by mouth at bedtime. IM program 12/03/19  Yes Angiulli, Lavon Paganini, PA-C  carvedilol (COREG) 25 MG tablet Take 1 tablet (25 mg total) by mouth 2 (two) times daily  with a meal. 01/04/20  Yes [name], Rylee, MD  clopidogrel (PLAVIX) 75 MG tablet Take 1 tablet (75 mg total) by mouth daily. 12/03/19  Yes Angiulli, Lavon Paganini, PA-C  dorzolamide-timolol (COSOPT) 22.3-6.8 MG/ML ophthalmic solution Place 1 drop into the right eye 2 (two) times daily. 12/03/19  Yes Angiulli, Lavon Paganini, PA-C  finasteride (PROSCAR) 5 MG tablet Take 1 tablet (5 mg total) by mouth daily. 12/03/19  Yes Angiulli, Lavon Paganini, PA-C  furosemide (LASIX) 80 MG tablet Take 1 tablet (80 mg total) by mouth 2 (two) times daily. 12/15/19  Yes Lucious Groves, DO  hydrALAZINE (APRESOLINE) 50 MG tablet Take 1 tablet (50 mg total) by mouth  every 8 (eight) hours. 12/26/19  Yes Seawell, Jaimie A, DO  isosorbide mononitrate (IMDUR) 60 MG 24 hr tablet Take 1 tablet (60 mg total) by mouth daily. 12/03/19 01/13/20 Yes Angiulli, Lavon Paganini, PA-C  tamsulosin (FLOMAX) 0.4 MG CAPS capsule Take 1 capsule (0.4 mg total) by mouth daily after supper. 12/03/19  Yes Angiulli, Lavon Paganini, PA-C  vitamin B-12 (CYANOCOBALAMIN) 1000 MCG tablet Take 1 tablet (1,000 mcg total) by mouth daily. 12/03/19  Yes Angiulli, Lavon Paganini, PA-C  Accu-Chek FastClix Lancets MISC Check blood sugar up to 7 times a week as instructed 12/03/19   Angiulli, Lavon Paganini, PA-C  amLODipine (NORVASC) 10 MG tablet TAKE 1 TABLET(10 MG) BY MOUTH DAILY Patient not taking: Reported on 01/13/2020 12/28/19   [name], MD  Blood Glucose Monitoring Suppl (ACCU-CHEK GUIDE) w/Device KIT 1 each by Does not apply route daily. Check blood sugar as instructed up to 7 times a week 12/03/19   Angiulli, Lavon Paganini, PA-C  ciprofloxacin (CIPRO) 250 MG tablet Take 2 tablets (500 mg total) by mouth daily with breakfast. Patient not taking: Reported on 01/13/2020 12/27/19   Molli Hazard A, DO  glucose blood (ACCU-CHEK GUIDE) test strip Check blood sugar up to 7 times a week as instructed 12/03/19   Angiulli, Lavon Paganini, PA-C  hydrALAZINE (APRESOLINE) 50 MG tablet TAKE 1 TABLET(50 MG) BY MOUTH THREE TIMES DAILY Patient not taking: Reported on 01/13/2020 12/28/19   [name], MD  melatonin 3 MG TABS tablet Take 1 tablet (3 mg total) by mouth at bedtime. Patient not taking: Reported on 01/13/2020 12/03/19   Cathlyn Parsons, PA-C     Family History  Problem Relation Age of Onset  . Hypertension Mother   . Hyperlipidemia Mother   . Hyperlipidemia Father   . Colon cancer Neg Hx   . Colon polyps Neg Hx   . Esophageal cancer Neg Hx   . Rectal cancer Neg Hx   . Stomach cancer Neg Hx     Social History   Socioeconomic History  . Marital status: Married    Spouse name: Not on file  . Number of children: Not on  file  . Years of education: Not on file  . Highest education level: Not on file  Occupational History  . Not on file  Tobacco Use  . Smoking status: Former Research scientist (life sciences)  . Smokeless tobacco: Former Systems developer    Types: Chew    Quit date: 07/09/1978  . Tobacco comment: quit 1 year ago  Vaping Use  . Vaping Use: Never used  Substance and Sexual Activity  . Alcohol use: No    Alcohol/week: 1.0 standard drink    Types: 1 Cans of beer per week    Comment: quit last march/2017  . Drug use: No  . Sexual activity: Not  on file  Other Topics Concern  . Not on file  Social History Narrative  . Not on file   Social Determinants of Health   Financial Resource Strain:   . Difficulty of Paying Living Expenses:   Food Insecurity:   . Worried About Charity fundraiser in the Last Year:   . Arboriculturist in the Last Year:   Transportation Needs:   . Film/video editor (Medical):   Marland Kitchen Lack of Transportation (Non-Medical):   Physical Activity:   . Days of Exercise per Week:   . Minutes of Exercise per Session:   Stress:   . Feeling of Stress :   Social Connections:   . Frequency of Communication with Friends and Family:   . Frequency of Social Gatherings with Friends and Family:   . Attends Religious Services:   . Active Member of Clubs or Organizations:   . Attends Archivist Meetings:   Marland Kitchen Marital Status:      Review of Systems: A 12 point ROS discussed and pertinent positives are indicated in the HPI above.  All other systems are negative.  Review of Systems  Vital Signs: BP 131/65 (BP Location: Right Arm)   Pulse 67   Temp 98.7 F (37.1 C) (Oral)   Resp 18   Ht 6' (1.829 m)   Wt 76.3 kg   SpO2 98%   BMI 22.81 kg/m   Physical Exam Vitals reviewed.  Constitutional:      Appearance: Normal appearance.  HENT:     Head: Normocephalic and atraumatic.  Eyes:     Extraocular Movements: Extraocular movements intact.  Cardiovascular:     Rate and Rhythm: Normal rate  and regular rhythm.  Pulmonary:     Effort: Pulmonary effort is normal. No respiratory distress.     Breath sounds: Normal breath sounds.  Abdominal:     General: There is no distension.     Palpations: Abdomen is soft.     Tenderness: There is no abdominal tenderness.  Musculoskeletal:        General: Normal range of motion.     Cervical back: Normal range of motion.  Skin:    General: Skin is warm and dry.  Neurological:     General: No focal deficit present.     Mental Status: He is alert and oriented to person, place, and time.  Psychiatric:        Mood and Affect: Mood normal.        Behavior: Behavior normal.        Thought Content: Thought content normal.        Judgment: Judgment normal.     Imaging: DG Chest 2 View  Result Date: 01/13/2020 CLINICAL DATA:  Shortness of breath EXAM: CHEST - 2 VIEW COMPARISON:  12/24/2019 FINDINGS: Cardiac shadow remains enlarged. Increasing right basilar infiltrate and associated effusion is noted. Small left pleural effusion is noted as well. No acute bony abnormality is noted. IMPRESSION: Bilateral effusions right greater than left with associated right lower lobe infiltrate increased from the prior exam. Electronically Signed   By: Inez Catalina M.D.   On: 01/13/2020 00:56   US RENAL  Result Date: 12/25/2019 CLINICAL DATA:  Acute renal insufficiency EXAM: RENAL / URINARY TRACT ULTRASOUND COMPLETE COMPARISON:  11/29/2019 FINDINGS: Right Kidney: Renal measurements: 12.0 x 4.9 x 5.5 cm = volume: 169 mL . No hydronephrosis. Normal renal echogenicity and cortical thickness. Trace perinephric fluid. Left Kidney: Renal measurements: 11.3  x 5.7 x 4.6 cm = volume: 155 mL. No hydronephrosis. Normal echogenicity and renal cortical thickness. Trace perinephric fluid. Bladder: Appears thick walled, but is underdistended. Other: Bilateral pleural effusions incidentally noted. IMPRESSION: 1. No hydronephrosis or other explanation for renal insufficiency. 2.  Bladder wall thickening which could be artifactual in the setting of underdistention. Correlate with any symptoms of cystitis or bladder outlet obstruction. 3. Bilateral pleural effusions. Electronically Signed   By: Abigail Miyamoto M.D.   On: 12/25/2019 09:19   DG Chest Portable 1 View  Result Date: 12/24/2019 CLINICAL DATA:  Shortness of breath EXAM: PORTABLE CHEST 1 VIEW COMPARISON:  11/29/2019 FINDINGS: Cardiac shadow is enlarged but stable. Aortic calcifications are again seen. Vascular congestion with mild interstitial edema and small effusions is seen. This is slightly worse than that noted on the prior exam. No bony abnormality is noted. IMPRESSION: Changes consistent with CHF Electronically Signed   By: Inez Catalina M.D.   On: 12/24/2019 09:28   ECHOCARDIOGRAM COMPLETE  Result Date: 12/24/2019    ECHOCARDIOGRAM REPORT   Patient Name:   Dennis Macias Date of Exam: 12/24/2019 Medical Rec #:  [id number]  Height:       72.0 in Accession #:    [id number] Weight:       166.0 lb Date of Birth:  03/01/1951  BSA:          1.968 m Patient Age:    62 years   BP:           171/92 mmHg Patient Gender: M          HR:           76 bpm. Exam Location:  Inpatient Procedure: 2D Echo, Cardiac Doppler and Color Doppler                         STAT ECHO Reported to: Dr Boykin Reaper on 12/24/2019 4:38:00 PM. Indications:    CHF-Acute Systolic  History:        Patient has prior history of Echocardiogram examinations, most                 recent 10/15/2019. Signs/Symptoms:Shortness of Breath; Risk                 Factors:Hypertension, Diabetes, Dyslipidemia and Former Smoker.                 CKD.  Sonographer:    Clayton Lefort RDCS (AE) Referring Phys: [id number] [place]  1. Left ventricular ejection fraction, by estimation, is 35 to 40%. The left ventricle has moderately decreased function. The left ventricle demonstrates global hypokinesis. The left ventricular internal cavity size was mildly dilated. There is mild  left ventricular hypertrophy. Left ventricular diastolic parameters are consistent with Grade II diastolic dysfunction (pseudonormalization).  2. Right ventricular systolic function is normal. The right ventricular size is normal. There is moderately elevated pulmonary artery systolic pressure.  3. Left atrial size was moderately dilated.  4. Moderate pleural effusion.  5. The mitral valve is normal in structure. Moderate mitral valve regurgitation.  6. The aortic valve is normal in structure. Aortic valve regurgitation is not visualized. Mild aortic valve sclerosis is present, with no evidence of aortic valve stenosis.  7. The inferior vena cava is dilated in size with >50% respiratory variability, suggesting right atrial pressure of 8 mmHg. FINDINGS  Left Ventricle: Left ventricular ejection fraction, by estimation, is 35 to 40%. The  left ventricle has moderately decreased function. The left ventricle demonstrates global hypokinesis. The left ventricular internal cavity size was mildly dilated. There is mild left ventricular hypertrophy. Left ventricular diastolic parameters are consistent with Grade II diastolic dysfunction (pseudonormalization). Right Ventricle: The right ventricular size is normal. No increase in right ventricular wall thickness. Right ventricular systolic function is normal. There is moderately elevated pulmonary artery systolic pressure. The tricuspid regurgitant velocity is 3.28 m/s, and with an assumed right atrial pressure of 8 mmHg, the estimated right ventricular systolic pressure is 20.3 mmHg. Left Atrium: Left atrial size was moderately dilated. Right Atrium: Right atrial size was normal in size. Pericardium: There is no evidence of pericardial effusion. Mitral Valve: The mitral valve is normal in structure. There is mild thickening of the mitral valve leaflet(s). Moderate mitral valve regurgitation. MV peak gradient, 5.3 mmHg. The mean mitral valve gradient is 2.0 mmHg. Tricuspid  Valve: The tricuspid valve is normal in structure. Tricuspid valve regurgitation is mild. Aortic Valve: The aortic valve is normal in structure. Aortic valve regurgitation is not visualized. Mild aortic valve sclerosis is present, with no evidence of aortic valve stenosis. There is mild calcification of the aortic valve. Aortic valve mean gradient measures 4.0 mmHg. Aortic valve peak gradient measures 6.7 mmHg. Aortic valve area, by VTI measures 2.68 cm. Pulmonic Valve: The pulmonic valve was not well visualized. Pulmonic valve regurgitation is not visualized. Aorta: The aortic root and ascending aorta are structurally normal, with no evidence of dilitation. Venous: The inferior vena cava is dilated in size with greater than 50% respiratory variability, suggesting right atrial pressure of 8 mmHg. IAS/Shunts: No atrial level shunt detected by color flow Doppler. Additional Comments: A pacer wire is visualized. There is a moderate pleural effusion.  LEFT VENTRICLE PLAX 2D LVIDd:         5.80 cm  Diastology LVIDs:         3.90 cm  LV e' lateral:   4.54 cm/s LV PW:         1.20 cm  LV E/e' lateral: 25.6 LV IVS:        1.40 cm  LV e' medial:    4.87 cm/s LVOT diam:     2.30 cm  LV E/e' medial:  23.8 LV SV:         76 LV SV Index:   39 LVOT Area:     4.15 cm                          3D Volume EF:                         3D EF:        44 %                         LV EDV:       202 ml                         LV ESV:       113 ml                         LV SV:        89 ml RIGHT VENTRICLE             IVC RV Basal diam:  3.70 cm     IVC diam: 2.30 cm RV Mid diam:    2.70 cm RV S prime:     10.50 cm/s TAPSE (M-mode): 2.3 cm LEFT ATRIUM             Index       RIGHT ATRIUM           Index LA diam:        3.80 cm 1.93 cm/m  RA Area:     19.90 cm LA Vol (A2C):   81.0 ml 41.15 ml/m RA Volume:   59.60 ml  30.28 ml/m LA Vol (A4C):   60.4 ml 30.69 ml/m LA Biplane Vol: 70.3 ml 35.72 ml/m  AORTIC VALVE AV Area (Vmax):    2.84  cm AV Area (Vmean):   2.34 cm AV Area (VTI):     2.68 cm AV Vmax:           129.00 cm/s AV Vmean:          88.900 cm/s AV VTI:            0.284 m AV Peak Grad:      6.7 mmHg AV Mean Grad:      4.0 mmHg LVOT Vmax:         88.30 cm/s LVOT Vmean:        50.000 cm/s LVOT VTI:          0.183 m LVOT/AV VTI ratio: 0.64  AORTA Ao Root diam: 3.30 cm Ao Asc diam:  3.40 cm MITRAL VALVE                 TRICUSPID VALVE MV Area (PHT): 4.80 cm      TR Peak grad:   43.0 mmHg MV Peak grad:  5.3 mmHg      TR Vmax:        328.00 cm/s MV Mean grad:  2.0 mmHg MV Vmax:       1.15 m/s      SHUNTS MV Vmean:      58.7 cm/s     Systemic VTI:  0.18 m MV Decel Time: 158 msec      Systemic Diam: 2.30 cm MR Peak grad:    143.5 mmHg MR Mean grad:    89.0 mmHg MR Vmax:         599.00 cm/s MR Vmean:        442.0 cm/s MR PISA:         1.57 cm MR PISA Eff ROA: 10 mm MR PISA Radius:  0.50 cm MV E velocity: 116.00 cm/s MV A velocity: 87.80 cm/s MV E/A ratio:  1.32 Glori Bickers MD Electronically signed by Glori Bickers MD Signature Date/Time: 12/24/2019/5:04:22 PM    Final     Labs:  CBC: Recent Labs    12/26/19 0007 12/26/19 0007 01/12/20 2335 01/12/20 2335 01/13/20 0136 01/13/20 1010 01/13/20 1622 01/14/20 0312  WBC 12.5*  --  5.2  --   --  5.5  --  6.4  HGB 8.5*   < > 7.5*   < > 6.8* 7.1* 8.1* 8.2*  HCT 26.5*   < > 23.8*   < > 20.0* 23.6* 25.8* 26.0*  PLT 223  --  148*  --   --  139*  --  141*   < > = values in this interval not displayed.    COAGS: Recent Labs    09/18/19 0610 10/15/19 0851 11/14/19 0748 01/14/20 0312  INR 1.6* 1.5*  1.5* 1.9*  APTT  --  31 31  --     BMP: Recent Labs    12/26/19 0007 12/26/19 0007 01/12/20 2335 01/13/20 0136 01/13/20 1010 01/14/20 0312  NA 141   < > 141 143 141 142  K 4.6   < > 5.7* 5.7* 5.2* 5.2*  CL 111  --  112*  --  112* 112*  CO2 18*  --  15*  --  14* 16*  GLUCOSE 96  --  190*  --  108* 83  BUN 84*  --  103*  --  100* 101*  CALCIUM 8.7*  --  8.4*  --   9.9 8.3*  CREATININE 5.11*  --  6.30*  --  6.24* 6.54*  GFRNONAA 11*  --  8*  --  8* 8*  GFRAA 12*  --  10*  --  10* 9*   < > = values in this interval not displayed.    LIVER FUNCTION TESTS: Recent Labs    12/25/19 0519 12/26/19 0007 01/13/20 0107 01/13/20 1010  BILITOT 0.5 0.7 0.5 0.6  AST 17 16 23 17   ALT 12 10 23 19   ALKPHOS 53 44 66 57  PROT 5.9* 5.4* 5.8* 5.5*  ALBUMIN 2.4* 2.2* 2.7* 2.6*    TUMOR MARKERS: No results for input(s): AFPTM, CEA, CA199, CHROMGRNA in the last 8760 hours.  Assessment and Plan:  Worsening renal function, now requiring hemodialysis.  Will proceed with image guided placement of a tunneled hemodialysis catheter today by Dr. Anselm Pancoast.  Risks and benefits discussed with the patient including, but not limited to bleeding, infection, vascular injury, pneumothorax which may require chest tube placement, air embolism or even death  All of the patient's questions were answered, patient is agreeable to proceed. Consent signed and in chart.  Thank you for this interesting consult.  I greatly enjoyed meeting Dennis Macias and look forward to participating in their care.  A copy of this report was sent to the requesting provider on this date.  Electronically Signed: Murrell Redden, PA-C   01/14/2020, 8:56 AM      I spent a total of 20 Minutes in face to face in clinical consultation, greater than 50% of which was counseling/coordinating care for tunneled HD catheter. '

## 2020-01-14 NOTE — Progress Notes (Signed)
Patient ID: Dennis Macias, male   DOB: 10-03-1950, 69 y.o.   MRN: [id number] S: No new complaints or events overnight. O:BP 131/65 (BP Location: Right Arm)   Pulse 67   Temp 98.7 F (37.1 C) (Oral)   Resp 18   Ht 6' (1.829 m)   Wt 76.3 kg   SpO2 98%   BMI 22.81 kg/m   Intake/Output Summary (Last 24 hours) at 01/14/2020 1041 Last data filed at 01/14/2020 1039 Gross per 24 hour  Intake 1115 ml  Output 600 ml  Net 515 ml   Intake/Output: I/O last 3 completed shifts: In: 750 [P.O.:360; I.V.:75; Blood:315] Out: 550 [Urine:550]  Intake/Output this shift:  Total I/O In: 365 [P.O.:360; I.V.:5] Out: 200 [Urine:200] Weight change:  Gen: NAD CVS: no rub Resp: cta with decreased BS at both bases Abd: benign Ext: trace edema  Recent Labs  Lab 01/12/20 2335 01/13/20 0107 01/13/20 0136 01/13/20 1010 01/14/20 0312  NA 141  --  143 141 142  K 5.7*  --  5.7* 5.2* 5.2*  CL 112*  --   --  112* 112*  CO2 15*  --   --  14* 16*  GLUCOSE 190*  --   --  108* 83  BUN 103*  --   --  100* 101*  CREATININE 6.30*  --   --  6.24* 6.54*  ALBUMIN  --  2.7*  --  2.6*  --   CALCIUM 8.4*  --   --  9.9 8.3*  AST  --  23  --  17  --   ALT  --  23  --  19  --    Liver Function Tests: Recent Labs  Lab 01/13/20 0107 01/13/20 1010  AST 23 17  ALT 23 19  ALKPHOS 66 57  BILITOT 0.5 0.6  PROT 5.8* 5.5*  ALBUMIN 2.7* 2.6*   No results for input(s): LIPASE, AMYLASE in the last 168 hours. No results for input(s): AMMONIA in the last 168 hours. CBC: Recent Labs  Lab 01/12/20 2335 01/13/20 0136 01/13/20 1010 01/13/20 1622 01/14/20 0312  WBC 5.2  --  5.5  --  6.4  HGB 7.5*   < > 7.1* 8.1* 8.2*  HCT 23.8*   < > 23.6* 25.8* 26.0*  MCV 93.7  --  96.3  --  93.2  PLT 148*  --  139*  --  141*   < > = values in this interval not displayed.   Cardiac Enzymes: No results for input(s): CKTOTAL, CKMB, CKMBINDEX, TROPONINI in the last 168 hours. CBG: Recent Labs  Lab 01/13/20 0834 01/13/20 1642  01/13/20 1948 01/14/20 0810  GLUCAP 104* 124* 165* 73    Iron Studies: No results for input(s): IRON, TIBC, TRANSFERRIN, FERRITIN in the last 72 hours. Studies/Results: DG Chest 2 View  Result Date: 01/13/2020 CLINICAL DATA:  Shortness of breath EXAM: CHEST - 2 VIEW COMPARISON:  12/24/2019 FINDINGS: Cardiac shadow remains enlarged. Increasing right basilar infiltrate and associated effusion is noted. Small left pleural effusion is noted as well. No acute bony abnormality is noted. IMPRESSION: Bilateral effusions right greater than left with associated right lower lobe infiltrate increased from the prior exam. Electronically Signed   By: Inez Catalina M.D.   On: 01/13/2020 00:56   . aspirin EC  81 mg Oral Daily  . atorvastatin  80 mg Oral QHS  . carvedilol  25 mg Oral BID WC  . clopidogrel  75 mg Oral Daily  . [START  ON 01/15/2020] heparin  5,000 Units Subcutaneous Q8H  . hydrALAZINE  50 mg Oral Q8H  . sodium chloride flush  3 mL Intravenous Q12H    BMET    Component Value Date/Time   NA 142 01/14/2020 0312   NA 137 12/10/2019 1439   K 5.2 (H) 01/14/2020 0312   CL 112 (H) 01/14/2020 0312   CO2 16 (L) 01/14/2020 0312   GLUCOSE 83 01/14/2020 0312   BUN 101 (H) 01/14/2020 0312   BUN 80 (HH) 12/10/2019 1439   CREATININE 6.54 (H) 01/14/2020 0312   CALCIUM 8.3 (L) 01/14/2020 0312   GFRNONAA 8 (L) 01/14/2020 0312   GFRAA 9 (L) 01/14/2020 0312   CBC    Component Value Date/Time   WBC 6.4 01/14/2020 0312   RBC 2.79 (L) 01/14/2020 0312   HGB 8.2 (L) 01/14/2020 0312   HGB 9.6 (L) 12/10/2019 1439   HCT 26.0 (L) 01/14/2020 0312   HCT 28.4 (L) 12/10/2019 1439   PLT 141 (L) 01/14/2020 0312   PLT 285 12/10/2019 1439   MCV 93.2 01/14/2020 0312   MCV 89 12/10/2019 1439   MCH 29.4 01/14/2020 0312   MCHC 31.5 01/14/2020 0312   RDW 16.2 (H) 01/14/2020 0312   RDW 14.5 12/10/2019 1439   LYMPHSABS 1.1 12/24/2019 0852   LYMPHSABS 2.0 02/24/2018 1142   MONOABS 0.4 12/24/2019 0852   EOSABS  0.0 12/24/2019 0852   EOSABS 0.1 02/24/2018 1142   BASOSABS 0.0 12/24/2019 0852   BASOSABS 0.0 02/24/2018 1142    Assessment/Plan: 1.  Progressive CKD stage V- underlying CKD is due to DM and HTN and has had a slowly progressive decline in his renal function over the past 7 months.  He has failed outpatient diuretics with multiple hospitalizations for acute on chronic CHF.  He has mild uremic symptoms with anorexia, fatigue, weight loss, and nausea.  I discussed hemodialysis with Dennis Macias and he states that he would want dialysis if he needed it.  He is a marginal long term dialysis candidate given his multiple co-morbidities, especially his ischemic CMP as well as poor functional and nutritional status. 1. Have consulted IR for [facility] placement today and initiate a trial of HD to see if he can tolerate it. 2. I have consulted Dr. Donzetta Matters of [facility] for vein mapping and placement of an AVF or AVG 2. Acute on chronic anemia of CKD stage V- s/p blood transfusion and will need to start ESA.  Follow H/H and transfuse prn 3. Hyperkalemia due to #1- responding to medical management 4. hyperhidrosis- has been seen as an outpatient for this and is longstanding. 5. Acute on chronic combined systolic and diastolic CHF- improved with IV lasix 6. HTN- stable 7. Adult failure to thrive- pt with multiple admissions in last 7 months and decline in his functional and nutritional status.  He is a marginal dialysis candidate and recommend Palliative care consult to help set goals/limits of care 8. Ischemic CMP- no chest pain.   Donetta Potts, MD [employer] [phone]

## 2020-01-14 NOTE — Procedures (Signed)
Interventional Radiology Procedure:   Indications: ESRD  Procedure: Tunneled dialysis catheter placement  Findings: Right jugular HD catheter, tip at SVC/RA junction  Complications: None     EBL: less than 10 ml  Plan: Catheter is ready to use.    Nayan Proch R. Anselm Pancoast, MD  Pager: [phone]

## 2020-01-15 ENCOUNTER — Encounter (HOSPITAL_COMMUNITY): Payer: Self-pay

## 2020-01-15 DIAGNOSIS — Z7189 Other specified counseling: Secondary | ICD-10-CM

## 2020-01-15 DIAGNOSIS — N189 Chronic kidney disease, unspecified: Secondary | ICD-10-CM

## 2020-01-15 DIAGNOSIS — N179 Acute kidney failure, unspecified: Secondary | ICD-10-CM

## 2020-01-15 DIAGNOSIS — N186 End stage renal disease: Secondary | ICD-10-CM

## 2020-01-15 DIAGNOSIS — E875 Hyperkalemia: Secondary | ICD-10-CM

## 2020-01-15 DIAGNOSIS — Z515 Encounter for palliative care: Secondary | ICD-10-CM

## 2020-01-15 DIAGNOSIS — N185 Chronic kidney disease, stage 5: Secondary | ICD-10-CM

## 2020-01-15 HISTORY — PX: IR US GUIDE VASC ACCESS RIGHT: IMG2390

## 2020-01-15 LAB — CBC
HCT: 26.4 % — ABNORMAL LOW (ref 39.0–52.0)
Hemoglobin: 8.5 g/dL — ABNORMAL LOW (ref 13.0–17.0)
MCH: 29.4 pg (ref 26.0–34.0)
MCHC: 32.2 g/dL (ref 30.0–36.0)
MCV: 91.3 fL (ref 80.0–100.0)
Platelets: 154 10*3/uL (ref 150–400)
RBC: 2.89 MIL/uL — ABNORMAL LOW (ref 4.22–5.81)
RDW: 16.2 % — ABNORMAL HIGH (ref 11.5–15.5)
WBC: 7 10*3/uL (ref 4.0–10.5)
nRBC: 0 % (ref 0.0–0.2)

## 2020-01-15 LAB — RENAL FUNCTION PANEL
Albumin: 2.4 g/dL — ABNORMAL LOW (ref 3.5–5.0)
Anion gap: 11 (ref 5–15)
BUN: 60 mg/dL — ABNORMAL HIGH (ref 8–23)
CO2: 21 mmol/L — ABNORMAL LOW (ref 22–32)
Calcium: 7.9 mg/dL — ABNORMAL LOW (ref 8.9–10.3)
Chloride: 109 mmol/L (ref 98–111)
Creatinine, Ser: 4.56 mg/dL — ABNORMAL HIGH (ref 0.61–1.24)
GFR calc Af Amer: 14 mL/min — ABNORMAL LOW (ref >60)
GFR calc non Af Amer: 12 mL/min — ABNORMAL LOW (ref >60)
Glucose, Bld: 80 mg/dL (ref 70–99)
Phosphorus: 6.1 mg/dL — ABNORMAL HIGH (ref 2.5–4.6)
Potassium: 4 mmol/L (ref 3.5–5.1)
Sodium: 141 mmol/L (ref 135–145)

## 2020-01-15 LAB — GLUCOSE, CAPILLARY: Glucose-Capillary: 74 mg/dL (ref 70–99)

## 2020-01-15 MED ORDER — HEPARIN SODIUM (PORCINE) 1000 UNIT/ML IJ SOLN
INTRAMUSCULAR | Status: AC
  Filled 2020-01-15: qty 1

## 2020-01-15 MED ORDER — DORZOLAMIDE HCL-TIMOLOL MAL 2-0.5 % OP SOLN
1.0000 [drp] | Freq: Two times a day (BID) | OPHTHALMIC | Status: DC
  Administered 2020-01-15 – 2020-01-21 (×11): 1 [drp] via OPHTHALMIC
  Filled 2020-01-15: qty 10

## 2020-01-15 MED ORDER — SODIUM CHLORIDE 0.9% FLUSH: 10.0000 mL | INTRAVENOUS | Status: DC | PRN

## 2020-01-15 MED ORDER — "THROMBI-PAD 3""X3"" EX PADS"
1.0000 | MEDICATED_PAD | Freq: Once | CUTANEOUS | Status: AC
  Administered 2020-01-15: 1 via TOPICAL
  Filled 2020-01-15: qty 1

## 2020-01-15 MED ORDER — SODIUM CHLORIDE 0.9% FLUSH
10.0000 mL | Freq: Two times a day (BID) | INTRAVENOUS | Status: DC
  Administered 2020-01-15 – 2020-01-21 (×4): 10 mL

## 2020-01-15 MED ORDER — HEPARIN SODIUM (PORCINE) 1000 UNIT/ML IJ SOLN
INTRAMUSCULAR | Status: AC
  Filled 2020-01-15: qty 4

## 2020-01-15 NOTE — Progress Notes (Signed)
Per dialysis nurse, HD site bleeding.  She changed the dressing at [place].    Site is blood soaked and leaking from bottom of dressing by 115.  Contacted MD on call.  Per instruction, pressure applied for 20+ minutes.  New gauze placed at bottom of dressing to monitor for continued bleeding.    Site check - continuing to bleed.  Pressure applied for 30 minutes.

## 2020-01-15 NOTE — Progress Notes (Signed)
Patient ID: Dennis Macias, male   DOB: 1950/09/04, 69 y.o.   MRN: [id number] S: Feels well, no complaints O:BP (!) 141/63 (BP Location: Right Arm)   Pulse 73   Temp 99.3 F (37.4 C) (Oral)   Resp 16   Ht 6' (1.829 m)   Wt 73 kg   SpO2 98%   BMI 21.83 kg/m   Intake/Output Summary (Last 24 hours) at 01/15/2020 1101 Last data filed at 01/15/2020 0813 Gross per 24 hour  Intake 360 ml  Output 1800 ml  Net -1440 ml   Intake/Output: I/O last 3 completed shifts: In: 485 [P.O.:480; I.V.:5] Out: 1600 [Urine:600; Other:1000]  Intake/Output this shift:  Total I/O In: -  Out: 400 [Urine:400] Weight change: -2.6 kg Gen: NAD CVS: no rub Resp: cta Abd: benign Ext: trace edema  Recent Labs  Lab 01/12/20 2335 01/13/20 0107 01/13/20 0136 01/13/20 1010 01/14/20 0312 01/15/20 0744  NA 141  --  143 141 142 141  K 5.7*  --  5.7* 5.2* 5.2* 4.0  CL 112*  --   --  112* 112* 109  CO2 15*  --   --  14* 16* 21*  GLUCOSE 190*  --   --  108* 83 80  BUN 103*  --   --  100* 101* 60*  CREATININE 6.30*  --   --  6.24* 6.54* 4.56*  ALBUMIN  --  2.7*  --  2.6*  --  2.4*  CALCIUM 8.4*  --   --  9.9 8.3* 7.9*  PHOS  --   --   --   --   --  6.1*  AST  --  23  --  17  --   --   ALT  --  23  --  19  --   --    Liver Function Tests: Recent Labs  Lab 01/13/20 0107 01/13/20 1010 01/15/20 0744  AST 23 17  --   ALT 23 19  --   ALKPHOS 66 57  --   BILITOT 0.5 0.6  --   PROT 5.8* 5.5*  --   ALBUMIN 2.7* 2.6* 2.4*   No results for input(s): LIPASE, AMYLASE in the last 168 hours. No results for input(s): AMMONIA in the last 168 hours. CBC: Recent Labs  Lab 01/12/20 2335 01/13/20 0136 01/13/20 1010 01/13/20 1010 01/13/20 1622 01/14/20 0312 01/15/20 0744  WBC 5.2   < > 5.5  --   --  6.4 7.0  HGB 7.5*   < > 7.1*   < > 8.1* 8.2* 8.5*  HCT 23.8*   < > 23.6*   < > 25.8* 26.0* 26.4*  MCV 93.7  --  96.3  --   --  93.2 91.3  PLT 148*   < > 139*  --   --  141* 154   < > = values in this interval not  displayed.   Cardiac Enzymes: No results for input(s): CKTOTAL, CKMB, CKMBINDEX, TROPONINI in the last 168 hours. CBG: Recent Labs  Lab 01/13/20 1642 01/13/20 1948 01/14/20 0810 01/14/20 1538 01/15/20 0801  GLUCAP 124* 165* 73 77 74    Iron Studies: No results for input(s): IRON, TIBC, TRANSFERRIN, FERRITIN in the last 72 hours. Studies/Results: IR Fluoro Guide CV Line Right  Result Date: 01/14/2020 INDICATION: 69 year old with end-stage renal disease and needs a hemodialysis catheter. EXAM: FLUOROSCOPIC AND ULTRASOUND GUIDED PLACEMENT OF A TUNNELED DIALYSIS CATHETER Physician: Stephan Minister. Anselm Pancoast, MD MEDICATIONS: Ancef 2 g; The antibiotic  was administered within an appropriate time interval prior to skin puncture. ANESTHESIA/SEDATION: Versed 1.0 mg IV; Fentanyl 25 mcg IV; Moderate Sedation Time:  26 minutes The patient was continuously monitored during the procedure by the interventional radiology nurse under my direct supervision. FLUOROSCOPY TIME:  Fluoroscopy Time: 24 seconds, 3 mGy COMPLICATIONS: None immediate. PROCEDURE: Informed consent was obtained for placement of a tunneled dialysis catheter. The patient was placed supine on the interventional table. Ultrasound confirmed a patent right internal jugular vein. Ultrasound image was obtained for documentation. The right neck and chest was prepped and draped in a sterile fashion. Maximal barrier sterile technique was utilized including caps, mask, sterile gowns, sterile gloves, sterile drape, hand hygiene and skin antiseptic. The right neck was anesthetized with 1% lidocaine. A small incision was made with #11 blade scalpel. A 21 gauge needle directed into the right internal jugular vein with ultrasound guidance. A micropuncture dilator set was placed. A 23 cm tip to cuff Palindrome catheter was selected. The skin below the right clavicle was anesthetized and a small incision was made with an #11 blade scalpel. A subcutaneous tunnel was formed to  the vein dermatotomy site. The catheter was brought through the tunnel. The vein dermatotomy site was dilated to accommodate a peel-away sheath. The catheter was placed through the peel-away sheath and directed into the central venous structures. The tip of the catheter was placed at the superior cavoatrial junction with fluoroscopy. Fluoroscopic images were obtained for documentation. Both lumens were found to aspirate and flush well. The proper amount of heparin was flushed in both lumens. The vein dermatotomy site was closed using a single layer of absorbable suture and Dermabond. Gel-Foam was placed in subcutaneous tract. The catheter was secured to the skin using Prolene suture. IMPRESSION: Successful placement of a right jugular tunneled dialysis catheter using ultrasound and fluoroscopic guidance. Electronically Signed   By: Markus Daft M.D.   On: 01/14/2020 15:48   [facility] [place] UPPER EXT VEIN MAPPING (PRE-OP AVF)  Result Date: 01/14/2020 UPPER EXTREMITY VEIN MAPPING  Indications: Pre-access. Comparison Study: No prior studies. Performing Technologist: Oliver Hum RVT  Examination Guidelines: A complete evaluation includes B-mode imaging, spectral Doppler, color Doppler, and power Doppler as needed of all accessible portions of each vessel. Bilateral testing is considered an integral part of a complete examination. Limited examinations for reoccurring indications may be performed as noted. +-----------------+-------------+----------+---------+ Right Cephalic   Diameter (cm)Depth (cm)Findings  +-----------------+-------------+----------+---------+ Shoulder             0.36        0.79             +-----------------+-------------+----------+---------+ Prox upper arm       0.39        0.41             +-----------------+-------------+----------+---------+ Mid upper arm        0.43        0.27   branching +-----------------+-------------+----------+---------+ Dist upper arm       0.31         0.26             +-----------------+-------------+----------+---------+ Antecubital fossa    0.39        0.29   branching +-----------------+-------------+----------+---------+ Prox forearm         0.22        0.27             +-----------------+-------------+----------+---------+ Mid forearm  0.22        0.31   branching +-----------------+-------------+----------+---------+ Dist forearm         0.24        0.28             +-----------------+-------------+----------+---------+ +-----------------+-------------+----------+---------+ Left Cephalic    Diameter (cm)Depth (cm)Findings  +-----------------+-------------+----------+---------+ Shoulder             0.23        0.98             +-----------------+-------------+----------+---------+ Prox upper arm       0.30        0.40   branching +-----------------+-------------+----------+---------+ Mid upper arm        0.22        0.36             +-----------------+-------------+----------+---------+ Dist upper arm       0.17        0.30   branching +-----------------+-------------+----------+---------+ Antecubital fossa    0.33        0.22             +-----------------+-------------+----------+---------+ Prox forearm         0.24        0.40   branching +-----------------+-------------+----------+---------+ Mid forearm          0.21        0.38             +-----------------+-------------+----------+---------+ Dist forearm         0.31        0.22             +-----------------+-------------+----------+---------+ +-----------------+-------------+----------+---------+ Left Basilic     Diameter (cm)Depth (cm)Findings  +-----------------+-------------+----------+---------+ Shoulder             0.45        1.00             +-----------------+-------------+----------+---------+ Mid upper arm        0.26        0.59   branching +-----------------+-------------+----------+---------+  Dist upper arm       0.25        0.53   branching +-----------------+-------------+----------+---------+ Antecubital fossa    0.14        0.63   branching +-----------------+-------------+----------+---------+ Prox forearm         0.17        0.20             +-----------------+-------------+----------+---------+ Mid forearm          0.07        0.17             +-----------------+-------------+----------+---------+ Distal forearm       0.12        0.26   branching +-----------------+-------------+----------+---------+ *See table(s) above for measurements and observations.  Diagnosing physician: Servando Snare MD Electronically signed by Servando Snare MD on 01/14/2020 at 8:14:57 PM.    Final    . aspirin EC  81 mg Oral Daily  . atorvastatin  80 mg Oral QHS  . carvedilol  25 mg Oral BID WC  . Chlorhexidine Gluconate Cloth  6 each Topical Q0600  . clopidogrel  75 mg Oral Daily  . heparin  5,000 Units Subcutaneous Q8H  . heparin sodium (porcine)      . hydrALAZINE  50 mg Oral Q8H  . sodium chloride flush  10-40 mL Intracatheter Q12H  . sodium chloride flush  3 mL Intravenous Q12H  BMET    Component Value Date/Time   NA 141 01/15/2020 0744   NA 137 12/10/2019 1439   K 4.0 01/15/2020 0744   CL 109 01/15/2020 0744   CO2 21 (L) 01/15/2020 0744   GLUCOSE 80 01/15/2020 0744   BUN 60 (H) 01/15/2020 0744   BUN 80 (HH) 12/10/2019 1439   CREATININE 4.56 (H) 01/15/2020 0744   CALCIUM 7.9 (L) 01/15/2020 0744   GFRNONAA 12 (L) 01/15/2020 0744   GFRAA 14 (L) 01/15/2020 0744   CBC    Component Value Date/Time   WBC 7.0 01/15/2020 0744   RBC 2.89 (L) 01/15/2020 0744   HGB 8.5 (L) 01/15/2020 0744   HGB 9.6 (L) 12/10/2019 1439   HCT 26.4 (L) 01/15/2020 0744   HCT 28.4 (L) 12/10/2019 1439   PLT 154 01/15/2020 0744   PLT 285 12/10/2019 1439   MCV 91.3 01/15/2020 0744   MCV 89 12/10/2019 1439   MCH 29.4 01/15/2020 0744   MCHC 32.2 01/15/2020 0744   RDW 16.2 (H) 01/15/2020  0744   RDW 14.5 12/10/2019 1439   LYMPHSABS 1.1 12/24/2019 0852   LYMPHSABS 2.0 02/24/2018 1142   MONOABS 0.4 12/24/2019 0852   EOSABS 0.0 12/24/2019 0852   EOSABS 0.1 02/24/2018 1142   BASOSABS 0.0 12/24/2019 0852   BASOSABS 0.0 02/24/2018 1142    Assessment/Plan: 1. Progressive CKD stage V, now ESRD- underlying CKD is due to DM and HTN and has had a slowly progressive decline in his renal function over the past 7 months. He has failed outpatient diuretics with multiple hospitalizations for acute on chronic CHF. He has mild uremic symptoms with anorexia, fatigue, weight loss, and nausea. I discussed hemodialysis with Mr. Greenley and he states that he would want dialysis if he needed it. He is a marginal long term dialysis candidate given his multiple co-morbidities, especially his ischemic CMP as well as poor functional and nutritional status. 1. S/p RIJ TDC placement 01/14/20 by IR  2. First session of HD 01/14/20 3. Plan for serial HD today and tomorrow. 4. Await outpatient schedule for HD. 5. Consulted [facility] for vein mapping and placement of an AVF or AVG 2. Acute on chronic anemia of CKD stage V- s/p blood transfusion and will need to start ESA. Follow H/H and transfuse prn 3. Hyperkalemia due to #1- responding to medical management 4. hyperhidrosis- has been seen as an outpatient for this and is longstanding. 5. Acute on chronic combined systolic and diastolic CHF- improved with IV lasix 6. HTN- stable 7. Adult failure to thrive- pt with multiple admissions in last 7 months and decline in his functional and nutritional status. He is a marginal dialysis candidate and recommend Palliative care consult to help set goals/limits of care 8. Ischemic CMP- no chest pain.  Donetta Potts, MD [employer] [phone]

## 2020-01-15 NOTE — Progress Notes (Signed)
  Progress Note    01/15/2020 4:12 PM * No surgery found *  Subjective: No overnight issues  Vitals:   01/15/20 1500 01/15/20 1530  BP: (!) 149/68 (!) 155/70  Pulse: 63 63  Resp: 14 13  Temp:    SpO2:      Physical Exam: Awake alert oriented Nonlabored respirations 1+ palpable brachial arteries bilaterally  CBC    Component Value Date/Time   WBC 7.0 01/15/2020 0744   RBC 2.89 (L) 01/15/2020 0744   HGB 8.5 (L) 01/15/2020 0744   HGB 9.6 (L) 12/10/2019 1439   HCT 26.4 (L) 01/15/2020 0744   HCT 28.4 (L) 12/10/2019 1439   PLT 154 01/15/2020 0744   PLT 285 12/10/2019 1439   MCV 91.3 01/15/2020 0744   MCV 89 12/10/2019 1439   MCH 29.4 01/15/2020 0744   MCHC 32.2 01/15/2020 0744   RDW 16.2 (H) 01/15/2020 0744   RDW 14.5 12/10/2019 1439   LYMPHSABS 1.1 12/24/2019 0852   LYMPHSABS 2.0 02/24/2018 1142   MONOABS 0.4 12/24/2019 0852   EOSABS 0.0 12/24/2019 0852   EOSABS 0.1 02/24/2018 1142   BASOSABS 0.0 12/24/2019 0852   BASOSABS 0.0 02/24/2018 1142    BMET    Component Value Date/Time   NA 141 01/15/2020 0744   NA 137 12/10/2019 1439   K 4.0 01/15/2020 0744   CL 109 01/15/2020 0744   CO2 21 (L) 01/15/2020 0744   GLUCOSE 80 01/15/2020 0744   BUN 60 (H) 01/15/2020 0744   BUN 80 (HH) 12/10/2019 1439   CREATININE 4.56 (H) 01/15/2020 0744   CALCIUM 7.9 (L) 01/15/2020 0744   GFRNONAA 12 (L) 01/15/2020 0744   GFRAA 14 (L) 01/15/2020 0744    INR    Component Value Date/Time   INR 1.9 (H) 01/14/2020 0312     Intake/Output Summary (Last 24 hours) at 01/15/2020 1612 Last data filed at 01/15/2020 0813 Gross per 24 hour  Intake 360 ml  Output 1800 ml  Net -1440 ml     Assessment/plan:  69 y.o. male is here with end-stage renal disease dialyzing via catheter.  We will plan for right arm AV fistula versus graft next Tuesday.  I discussed the risk benefits alternatives.  Patient does demonstrate limited understanding we will reiterate next Monday.   Shiah Berhow C.  Donzetta Matters, MD [facility] Office: [phone] Pager: [phone]  01/15/2020 4:12 PM

## 2020-01-15 NOTE — Progress Notes (Addendum)
Subjective: Examined patient at bedside.  Patient states he is feeling fine. Denies any pain or difficulty breathing.  States that his bleeding has stopped now.  States that dialysis went alright yesterday.  States that surgery is supposed to take him in on Monday for graft.   Informed patient about our plan. Denies any questions and concerns.   Objective:  Vital signs in last 24 hours: Vitals:   01/15/20 0000 01/15/20 0018 01/15/20 0037 01/15/20 0306  BP: 140/80 (!) 150/85 (!) 167/75 (!) 142/71  Pulse: 62 66 71 72  Resp: 18 20 18 17   Temp:  98.2 F (36.8 C) 98.6 F (37 C) 98.8 F (37.1 C)  TempSrc:  Oral Oral Oral  SpO2:  98% 99% 97%  Weight:  72.6 kg    Height:       Physical Exam Vitals and nursing note reviewed.  Constitutional:      General: He is not in acute distress.    Appearance: He is normal weight. He is not ill-appearing or toxic-appearing.  HENT:     Head: Normocephalic and atraumatic.  Eyes:     Extraocular Movements: Extraocular movements intact.  Cardiovascular:     Rate and Rhythm: Normal rate and regular rhythm.     Pulses: Normal pulses.          Radial pulses are 2+ on the right side and 2+ on the left side.       Dorsalis pedis pulses are 2+ on the right side and 2+ on the left side.     Heart sounds: Normal heart sounds, S1 normal and S2 normal. No murmur heard.   Pulmonary:     Effort: Pulmonary effort is normal. No respiratory distress.     Breath sounds: Normal breath sounds. No stridor. No wheezing.  Abdominal:     General: Abdomen is flat. There is no distension.     Palpations: Abdomen is soft. There is no mass.     Tenderness: There is no abdominal tenderness.  Musculoskeletal:        General: No tenderness.     Right lower leg: No edema.     Left lower leg: No edema.  Neurological:     General: No focal deficit present.      Assessment/Plan: Mr. Prabhakar is a 69yr old male with PMHx of DM, HTN, HLD, CKD, CVA, CHF, with a  recent hospitalization for HT emergency and HF exacerbation on 6/18.   Active Problems:   Acute on chronic anemia   Acute-on-chronic kidney injury ([place])   Hyperkalemia   CKD (chronic kidney disease), stage V (H[facility])   Goals of care, counseling/discussion  Progressive CKD V now ESRD: Nephrology has recommended the start of dialysis. Had a port placed yesterday and received his first dialysis during the night  - Appreciate nephrology's assistance  -Will have dialysis again tomorrow per Nephrology -Will have vascular create fistula for permanent dialysis access Monday or Tuesday next week -Coordinator will find dialysis center for patient to begin going to.  Goals of Care: On going. Limited due to encephalopathy on admission. Appreciate palliative care's assistance.      Hyperkalemia: Given lokelma 7/7, current lab 7/9 of 4.0. -Received dialysis yesterday -Continue to monitor daily.     Anemia of Chronic Disease secondary to ESRD: Hemoglobin yesterday was 8.2 post transfusion. Overnight had bleed from his port post dialysis. Will hold heparin today and reassess tomorrow about restarting -continue to trend hem during hospital stay -Started SCD  for DVT prophylaxis      Chronic Combined Heart Failure: Not in exacerbation, well compensated -Continue to monitor  -Continue Coreg 25mg  BID    Hyperhidrosis: Has history of sweating that has been followed by outpatient clinic. He has had multiple referrals to Dermatology but has not attended them. -Refer to Dermatology and attend appointment    HTN: Currently on hydralazine and Coreg, holding amlodipine for now -Continue hydralazine 50mg  q8 -Continue Coreg 25mg  BID -Monitor blood pressure may add amlodipine if needed      HLD: Continue home regiment -Continue Lipitor 80mg  daily      Diet: HH w/ fluid restriction IVF: none DVT PPX: SCD Code Status: Full  Prior to Admission Living Arrangement: home Anticipated Discharge  Location: home Barriers to Discharge: dialysis initiation Dispo: Anticipated discharge in approximately 3-4 day(s).   Briant Cedar, MD 01/15/2020, 6:45 AM Pager: [phone] After 5pm on weekdays and 1pm on weekends: On Call pager [phone]

## 2020-01-15 NOTE — Consult Note (Addendum)
Consultation Note Date: 01/15/2020   Patient Name: Dennis Macias  DOB: 12-Oct-1950  MRN: [id number]  Age / Sex: 69 y.o., male   PCP: [name], MD Referring Physician: Velna Ochs, MD   REASON FOR CONSULTATION:Establishing goals of care  Palliative Care consult requested for goals of care discussion in this 69 y.o. 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 He presented to the ED with concerns of shortness of breath. During ED work-up patient required BiPAP support initially.  BNP 705, Potassium 5.7, BUN 103, Cr 6.30 Chest x-ray showed vascular congestion with mild interstitial edema and small effusions.  Patient was initiated on IV Lasix. Since admission patient has been followed by Nephrology. With recommendations for serial HD. HD cath was placed on 01/14/20.   Clinical Assessment and Goals of Care: I have reviewed medical records including lab results, imaging, Epic notes, and MAR, received report from the bedside RN, and assessed the patient. I met at the bedside with patient to discuss diagnosis prognosis, GOC, EOL wishes, disposition and options.  Dennis Macias is awake, alert and oriented x3. He denies pain or shortness of breath.   I introduced Palliative Medicine as specialized medical care for people living with serious illness. It focuses on providing relief from the symptoms and stress of a serious illness. The goal is to improve quality of life for both the patient and the family. He verbalized understanding.   We discussed a brief life review of the patient, along with his functional and nutritional status. Dennis Macias reports he is originally from [place], [place].  He is not married but has a girlfriend of more than 30 years.  He has 4 children.  He is a retired Restaurant manager, fast food for 31 years.  He shares he  has 9 siblings of which only one is deceased.  He is a descendent of the [place] tribe.  He enjoys softball/baseball and is of [place] faith.  Prior to admission patient reports ambulating with a cane for gait stability.  He also shares he has a walker and wheelchair in the home but that does not use as often as his cane due to upstairs and downstairs.  He was able to perform most ADLs independently.  Does endorse occasional leg swelling and shortness of breath with exertion at times.  He reports he has not driven in several years.  We discussed His current illness and what it means in the larger context of His on-going co-morbidities. With specific discussions regarding his renal failure, cardiac history, and overall functional decline.  Natural disease trajectory and expectations were discussed.  Dennis Macias understanding of his current illness and comorbidities.  He reports he is hopeful for some improvement in his health and is tired of multiple hospitalizations.  Discussed at length plans for dialysis including limitations to treatments.  Dennis Macias reports he wishes to proceed with dialysis at this time understanding he may not tolerate long-term due to other comorbidities.  He expresses he remains hopeful for improvement in the ability to tolerate even if it is the rest of his life.  Space created an opportunity to allow patient to discuss what is most important to him.  Patient reports he feels his current quality of life is good and is not ready to consider "life as he know it being over".  I attempted  to elicit values and goals of care important to the patient.    Plez understands the health challenges that he may face however he again expresses wishes for full scope/full aggressive levels of care.  Advanced directives, concepts specific to code status, artifical feeding and hydration, and rehospitalization were considered and discussed. Patient does not have a documented  advanced directive.   I discussed at length with patient regarding current full CODE STATUS with consideration of his current illness and comorbidities.  Patient verbalized understanding and is requesting to remain a full code.  He reports his significant other can make such decisions as they are faced.  Education provided on advance directive and packet left at the bedside.  Rock reports his significant other, Dennis Macias would be his healthcare decision-maker if he becomes unable to make decisions.  He expressed interest in completing advanced directive documenting these wishes.   Hospice and Palliative Care services outpatient were explained and offered.  Given patient's goals are clear and set for full scope full aggressive levels of care recommendations for outpatient palliative support were offered.  Patient verbalized his understanding and awareness of both palliative and hospice's goals and philosophy of care.  He is in agreement with outpatient palliative.  Questions and concerns were addressed.  Hard Choices booklet left for review.  Patient was encouraged to call with questions or concerns.  PMT will continue to support holistically.   SOCIAL HISTORY:     reports that he has quit smoking. He quit smokeless tobacco use about 41 years ago.  His smokeless tobacco use included chew. He reports that he does not drink alcohol and does not use drugs.  CODE STATUS: Full code  ADVANCE DIRECTIVES: Patient reports in the event he is not able to make decisions for himself, Dennis Macias is his designated Media planner.  SYMPTOM MANAGEMENT: Per attending  Palliative Prophylaxis:   Bowel Regimen, Frequent Pain Assessment and Oral Care  PSYCHO-SOCIAL/SPIRITUAL:  Support System: Family  Desire for further Chaplaincy support: No  Additional Recommendations (Limitations, Scope, Preferences):  Full Scope Treatment   PAST MEDICAL HISTORY: Past Medical History:  Diagnosis Date   Anemia     Arthritis    past hx    Blindness    right eye   Cardiorenal syndrome    Cataract    removed both eyes   Dehydration    Diabetes ([place])    Glaucoma    History of CVA (cerebrovascular accident) 09/13/2015   History of stroke 09/13/2015   History of urinary retention    Hyperlipidemia    Hypertension    NSTEMI (non-ST elevated myocardial infarction) ([place])    Pernicious anemia 02/24/2018   S/P TURP    Stroke ([place])    2017- March   Syncope 11/2019   Tachycardia 08/26/2017   Tubular adenoma of colon 02/2017   Weight loss, non-intentional 08/26/2017   10 lbs between 6/18 & 2/19    PAST SURGICAL HISTORY:  Past Surgical History:  Procedure Laterality Date   CATARACT EXTRACTION, BILATERAL     COLONOSCOPY     IR FLUORO GUIDE CV LINE RIGHT  01/14/2020   IR THORACENTESIS ASP PLEURAL SPACE W/IMG GUIDE  09/21/2019   IR THORACENTESIS ASP PLEURAL SPACE W/IMG GUIDE  10/16/2019   LEFT HEART CATH AND CORONARY ANGIOGRAPHY N/A 11/19/2019   Procedure: LEFT HEART CATH AND CORONARY ANGIOGRAPHY;  Surgeon: Jettie Booze, MD;  Location: [facility];  Service: Cardiovascular;  Laterality: N/A;   POLYPECTOMY  REFRACTIVE SURGERY  10/2017   TEE WITHOUT CARDIOVERSION N/A 09/14/2015   Procedure: TRANSESOPHAGEAL ECHOCARDIOGRAM (TEE);  Surgeon: Larey Dresser, MD;  Location: [facility];  Service: Cardiovascular;  Laterality: N/A;   TRANSURETHRAL RESECTION OF PROSTATE N/A 10/20/2019   Procedure: TRANSURETHRAL RESECTION OF THE PROSTATE (TURP);  Surgeon: Irine Seal, MD;  Location: [facility];  Service: Urology;  Laterality: N/A;   UPPER GASTROINTESTINAL ENDOSCOPY      ALLERGIES:  is allergic to cefepime.   MEDICATIONS:  Current Facility-Administered Medications  Medication Dose Route Frequency Provider Last Rate Last Admin   0.9 %  sodium chloride infusion  100 mL Intravenous PRN Donato Heinz, MD       0.9 %  sodium chloride infusion  100 mL Intravenous PRN  Donato Heinz, MD       acetaminophen (TYLENOL) tablet 650 mg  650 mg Oral Q6H PRN Masoudi, Elhamalsadat, MD       Or   acetaminophen (TYLENOL) suppository 650 mg  650 mg Rectal Q6H PRN Masoudi, Elhamalsadat, MD       alteplase (CATHFLO ACTIVASE) injection 2 mg  2 mg Intracatheter Once PRN Donato Heinz, MD       aspirin EC tablet 81 mg  81 mg Oral Daily Masoudi, Elhamalsadat, MD   81 mg at 01/15/20 0846   atorvastatin (LIPITOR) tablet 80 mg  80 mg Oral QHS Masoudi, Elhamalsadat, MD   80 mg at 01/15/20 0105   carvedilol (COREG) tablet 25 mg  25 mg Oral BID WC Masoudi, Elhamalsadat, MD   25 mg at 01/15/20 0846   Chlorhexidine Gluconate Cloth 2 % PADS 6 each  6 each Topical Q0600 Donato Heinz, MD   6 each at 01/15/20 0700   clopidogrel (PLAVIX) tablet 75 mg  75 mg Oral Daily Masoudi, Elhamalsadat, MD   75 mg at 01/15/20 0846   heparin injection 1,000 Units  1,000 Units Dialysis PRN Donato Heinz, MD       heparin injection 5,000 Units  5,000 Units Subcutaneous Q8H Ardis Rowan, PA-C       heparin sodium (porcine) 1000 UNIT/ML injection            hydrALAZINE (APRESOLINE) tablet 50 mg  50 mg Oral Q8H Masoudi, Elhamalsadat, MD   50 mg at 01/15/20 0658   lidocaine (PF) (XYLOCAINE) 1 % injection 5 mL  5 mL Intradermal PRN Donato Heinz, MD       lidocaine-prilocaine (EMLA) cream 1 application  1 application Topical PRN Donato Heinz, MD       ondansetron (ZOFRAN) tablet 4 mg  4 mg Oral Q6H PRN Masoudi, Elhamalsadat, MD       Or   ondansetron (ZOFRAN) injection 4 mg  4 mg Intravenous Q6H PRN Masoudi, Elhamalsadat, MD       pentafluoroprop-tetrafluoroeth (GEBAUERS) aerosol 1 application  1 application Topical PRN Donato Heinz, MD       senna-docusate (Senokot-S) tablet 1 tablet  1 tablet Oral QHS PRN Masoudi, Elhamalsadat, MD       sodium chloride flush (NS) 0.9 % injection 10-40 mL  10-40 mL Intracatheter Q12H Guilloud, Hoyle Sauer, MD        sodium chloride flush (NS) 0.9 % injection 10-40 mL  10-40 mL Intracatheter PRN Velna Ochs, MD       sodium chloride flush (NS) 0.9 % injection 3 mL  3 mL Intravenous Q12H Masoudi, Elhamalsadat, MD   3 mL at 01/15/20 0847    VITAL SIGNS: BP (!) 141/63 (BP Location: Right  Arm)    Pulse 73    Temp 99.3 F (37.4 C) (Oral)    Resp 16    Ht 6' (1.829 m)    Wt 73 kg    SpO2 98%    BMI 21.83 kg/m  Filed Weights   01/14/20 2148 01/15/20 0018 01/15/20 0700  Weight: 73.6 kg 72.6 kg 73 kg    Estimated body mass index is 21.83 kg/m as calculated from the following:   Height as of this encounter: 6' (1.829 m).   Weight as of this encounter: 73 kg.  LABS: CBC:    Component Value Date/Time   WBC 7.0 01/15/2020 0744   HGB 8.5 (L) 01/15/2020 0744   HGB 9.6 (L) 12/10/2019 1439   HCT 26.4 (L) 01/15/2020 0744   HCT 28.4 (L) 12/10/2019 1439   PLT 154 01/15/2020 0744   PLT 285 12/10/2019 1439   Comprehensive Metabolic Panel:    Component Value Date/Time   NA 141 01/15/2020 0744   NA 137 12/10/2019 1439   K 4.0 01/15/2020 0744   CO2 21 (L) 01/15/2020 0744   BUN 60 (H) 01/15/2020 0744   BUN 80 (HH) 12/10/2019 1439   CREATININE 4.56 (H) 01/15/2020 0744   ALBUMIN 2.4 (L) 01/15/2020 0744   ALBUMIN 3.0 (L) 02/24/2018 1142     Review of Systems  Cardiovascular: Positive for leg swelling.  Neurological: Positive for weakness.  Unless otherwise noted, a complete review of systems is negative.  Physical Exam General: NAD, chronically-ill  Cardiovascular: regular rate and rhythm Pulmonary: diminished bilaterally  Abdomen: soft, nontender, + bowel sounds Extremities: no edema, no joint deformities Skin: no rashes, warm and dry, bruising to right arm Neurological: A&O x3, mood appropriate   Prognosis: Unable to determine (guarded)  Discharge Planning:  To Be Determined with outpatient palliative support.  Recommendations:  Full code-detailed discussion/education regarding CODE  STATUS with consideration of current illness and co-morbidities.   Continue with current plan of care per medical team.  Patient requests full scope/aggressive medical interventions as needed, including HD, artificial feeding, and heroic measures.  Patient is clear in expressing his goals are to receive all available medical treatment and not giving up on life as he no it.   Patient reports Zalmen Wrightsman would be his designated medical decision-maker in the event he was unable to speak for himself.  Expressed wishes to complete advanced directives while hospitalized.  (Spiritual care referral will be placed for AD completion assistance)  Patient in agreement for outpatient palliative support at discharge.  He remains hopeful for improvement/stability with plans to return home with his significant other/family.  PMT will continue to support and follow as needed.  Please contact team line for urgent needs.  Palliative Performance Scale: PPS 30%              Patient expressed understanding and was in agreement with this plan.   Thank you for allowing the Palliative Medicine Team to assist in the care of this patient.  Time In: 1110 Time Out: 1205 Time Total: 67  Visit consisted of counseling and education dealing with the complex and emotionally intense issues of symptom management and palliative care in the setting of serious and potentially life-threatening illness.Greater than 50%  of this time was spent counseling and coordinating care related to the above assessment and plan.  Signed by:  Alda Lea, AGPCNP-BC Palliative Medicine Team  Phone: [phone] Fax: [phone] Pager: [phone] Amion: Bjorn Pippin

## 2020-01-15 NOTE — Progress Notes (Signed)
Renal Navigator received notification from Dr. Marval Regal that patient needs referral for OP HD treatment for ESRD. Navigator met with patient at HD bedside, though he was very groggy as he had just woken up. He confirmed his address, gave permission to make referral, and permission to call "[name]." Her introduced [name] as the mother of his child and his transportation. She is listed as Dixon Boos, his spouse, in [employer]. Navigator left message for [name] to return call when able. Patient's home is between [place] and [place] [facility]. [place] does not have availability at this time. Navigator has submitted referral to [facility] to see if they can accommodate.  Navigator will follow closely.  Alphonzo Cruise, [place] Renal Navigator [phone]

## 2020-01-15 NOTE — Progress Notes (Addendum)
1308 Pt to Dialysis unit for Hemodialysis. 1715 Received pt back from hemodialysis, A&O x4. Denies pain at this time.

## 2020-01-16 DIAGNOSIS — D649 Anemia, unspecified: Secondary | ICD-10-CM

## 2020-01-16 DIAGNOSIS — N186 End stage renal disease: Secondary | ICD-10-CM

## 2020-01-16 LAB — GLUCOSE, CAPILLARY: Glucose-Capillary: 109 mg/dL — ABNORMAL HIGH (ref 70–99)

## 2020-01-16 MED ORDER — DARBEPOETIN ALFA 40 MCG/0.4ML IJ SOSY
40.0000 ug | PREFILLED_SYRINGE | Freq: Once | INTRAMUSCULAR | Status: AC
  Administered 2020-01-16: 40 ug via SUBCUTANEOUS
  Filled 2020-01-16: qty 0.4

## 2020-01-16 MED ORDER — AMLODIPINE BESYLATE 10 MG PO TABS
10.0000 mg | ORAL_TABLET | Freq: Every day | ORAL | Status: DC
  Administered 2020-01-16 – 2020-01-21 (×5): 10 mg via ORAL
  Filled 2020-01-16 (×5): qty 1

## 2020-01-16 NOTE — Plan of Care (Signed)

## 2020-01-16 NOTE — Progress Notes (Signed)
Patient ID: Dennis Macias, male   DOB: [birth year], 69 y.o.   MRN: [id number] S: Feels well, no complaints O:BP (!) 148/67 (BP Location: Left Arm)   Pulse 63   Temp 98.9 F (37.2 C) (Oral)   Resp 16   Ht 6' (1.829 m)   Wt 72.9 kg   SpO2 98%   BMI 21.80 kg/m   Intake/Output Summary (Last 24 hours) at 01/16/2020 1015 Last data filed at 01/16/2020 0600 Gross per 24 hour  Intake 0 ml  Output 1500 ml  Net -1500 ml   Intake/Output: I/O last 3 completed shifts: In: 0  Out: 3300 [Urine:1300; Other:2000]  Intake/Output this shift:  No intake/output data recorded. Weight change: 0.2 kg Gen: NAD CVS: no rub Resp: cta Abd: benign Ext: no edema  Recent Labs  Lab 01/12/20 2335 01/13/20 0107 01/13/20 0136 01/13/20 1010 01/14/20 0312 01/15/20 0744  NA 141  --  143 141 142 141  K 5.7*  --  5.7* 5.2* 5.2* 4.0  CL 112*  --   --  112* 112* 109  CO2 15*  --   --  14* 16* 21*  GLUCOSE 190*  --   --  108* 83 80  BUN 103*  --   --  100* 101* 60*  CREATININE 6.30*  --   --  6.24* 6.54* 4.56*  ALBUMIN  --  2.7*  --  2.6*  --  2.4*  CALCIUM 8.4*  --   --  9.9 8.3* 7.9*  PHOS  --   --   --   --   --  6.1*  AST  --  23  --  17  --   --   ALT  --  23  --  19  --   --    Liver Function Tests: Recent Labs  Lab 01/13/20 0107 01/13/20 1010 01/15/20 0744  AST 23 17  --   ALT 23 19  --   ALKPHOS 66 57  --   BILITOT 0.5 0.6  --   PROT 5.8* 5.5*  --   ALBUMIN 2.7* 2.6* 2.4*   No results for input(s): LIPASE, AMYLASE in the last 168 hours. No results for input(s): AMMONIA in the last 168 hours. CBC: Recent Labs  Lab 01/12/20 2335 01/13/20 0136 01/13/20 1010 01/13/20 1010 01/13/20 1622 01/14/20 0312 01/15/20 0744  WBC 5.2   < > 5.5  --   --  6.4 7.0  HGB 7.5*   < > 7.1*   < > 8.1* 8.2* 8.5*  HCT 23.8*   < > 23.6*   < > 25.8* 26.0* 26.4*  MCV 93.7  --  96.3  --   --  93.2 91.3  PLT 148*   < > 139*  --   --  141* 154   < > = values in this interval not displayed.   Cardiac  Enzymes: No results for input(s): CKTOTAL, CKMB, CKMBINDEX, TROPONINI in the last 168 hours. CBG: Recent Labs  Lab 01/13/20 1948 01/14/20 0810 01/14/20 1538 01/15/20 0801 01/16/20 0926  GLUCAP 165* 73 77 74 109*    Iron Studies: No results for input(s): IRON, TIBC, TRANSFERRIN, FERRITIN in the last 72 hours. Studies/Results: IR Fluoro Guide CV Line Right  Result Date: 01/14/2020 INDICATION: 69 year old with end-stage renal disease and needs a hemodialysis catheter. EXAM: FLUOROSCOPIC AND ULTRASOUND GUIDED PLACEMENT OF A TUNNELED DIALYSIS CATHETER Physician: Stephan Minister. Anselm Pancoast, MD MEDICATIONS: Ancef 2 g; The antibiotic was administered within an appropriate  time interval prior to skin puncture. ANESTHESIA/SEDATION: Versed 1.0 mg IV; Fentanyl 25 mcg IV; Moderate Sedation Time:  26 minutes The patient was continuously monitored during the procedure by the interventional radiology nurse under my direct supervision. FLUOROSCOPY TIME:  Fluoroscopy Time: 24 seconds, 3 mGy COMPLICATIONS: None immediate. PROCEDURE: Informed consent was obtained for placement of a tunneled dialysis catheter. The patient was placed supine on the interventional table. Ultrasound confirmed a patent right internal jugular vein. Ultrasound image was obtained for documentation. The right neck and chest was prepped and draped in a sterile fashion. Maximal barrier sterile technique was utilized including caps, mask, sterile gowns, sterile gloves, sterile drape, hand hygiene and skin antiseptic. The right neck was anesthetized with 1% lidocaine. A small incision was made with #11 blade scalpel. A 21 gauge needle directed into the right internal jugular vein with ultrasound guidance. A micropuncture dilator set was placed. A 23 cm tip to cuff Palindrome catheter was selected. The skin below the right clavicle was anesthetized and a small incision was made with an #11 blade scalpel. A subcutaneous tunnel was formed to the vein dermatotomy  site. The catheter was brought through the tunnel. The vein dermatotomy site was dilated to accommodate a peel-away sheath. The catheter was placed through the peel-away sheath and directed into the central venous structures. The tip of the catheter was placed at the superior cavoatrial junction with fluoroscopy. Fluoroscopic images were obtained for documentation. Both lumens were found to aspirate and flush well. The proper amount of heparin was flushed in both lumens. The vein dermatotomy site was closed using a single layer of absorbable suture and Dermabond. Gel-Foam was placed in subcutaneous tract. The catheter was secured to the skin using Prolene suture. IMPRESSION: Successful placement of a right jugular tunneled dialysis catheter using ultrasound and fluoroscopic guidance. Electronically Signed   By: Markus Daft M.D.   On: 01/14/2020 15:48   IR US Guide Vasc Access Right  Result Date: 01/15/2020 INDICATION: 69 year old with end-stage renal disease and needs a hemodialysis catheter.  EXAM: FLUOROSCOPIC AND ULTRASOUND GUIDED PLACEMENT OF A TUNNELED DIALYSIS CATHETER  Physician: Stephan Minister. Anselm Pancoast, MD  MEDICATIONS: Ancef 2 g; The antibiotic was administered within an appropriate time interval prior to skin puncture.  ANESTHESIA/SEDATION: Versed 1.0 mg IV; Fentanyl 25 mcg IV;  Moderate Sedation Time:  26 minutes  The patient was continuously monitored during the procedure by the interventional radiology nurse under my direct supervision.  FLUOROSCOPY TIME:  Fluoroscopy Time: 24 seconds, 3 mGy  COMPLICATIONS: None immediate.  PROCEDURE: Informed consent was obtained for placement of a tunneled dialysis catheter. The patient was placed supine on the interventional table. Ultrasound confirmed a patent right internal jugular vein. Ultrasound image was obtained for documentation. The right neck and chest was prepped and draped in a sterile fashion. Maximal barrier sterile technique was utilized including  caps, mask, sterile gowns, sterile gloves, sterile drape, hand hygiene and skin antiseptic. The right neck was anesthetized with 1% lidocaine. A small incision was made with #11 blade scalpel. A 21 gauge needle directed into the right internal jugular vein with ultrasound guidance. A micropuncture dilator set was placed. A 23 cm tip to cuff Palindrome catheter was selected. The skin below the right clavicle was anesthetized and a small incision was made with an #11 blade scalpel. A subcutaneous tunnel was formed to the vein dermatotomy site. The catheter was brought through the tunnel. The vein dermatotomy site was dilated to accommodate a peel-away sheath. The  catheter was placed through the peel-away sheath and directed into the central venous structures. The tip of the catheter was placed at the superior cavoatrial junction with fluoroscopy. Fluoroscopic images were obtained for documentation. Both lumens were found to aspirate and flush well. The proper amount of heparin was flushed in both lumens. The vein dermatotomy site was closed using a single layer of absorbable suture and Dermabond. Gel-Foam was placed in subcutaneous tract. The catheter was secured to the skin using Prolene suture.  IMPRESSION: Successful placement of a right jugular tunneled dialysis catheter using ultrasound and fluoroscopic guidance.   Electronically Signed   By: Markus Daft M.D.   On: 01/14/2020 15:48   VAS [place] UPPER EXT VEIN MAPPING (PRE-OP AVF)  Result Date: 01/14/2020 UPPER EXTREMITY VEIN MAPPING  Indications: Pre-access. Comparison Study: No prior studies. Performing Technologist: Oliver Hum RVT  Examination Guidelines: A complete evaluation includes B-mode imaging, spectral Doppler, color Doppler, and power Doppler as needed of all accessible portions of each vessel. Bilateral testing is considered an integral part of a complete examination. Limited examinations for reoccurring indications may be performed as noted.  +-----------------+-------------+----------+---------+ Right Cephalic   Diameter (cm)Depth (cm)Findings  +-----------------+-------------+----------+---------+ Shoulder             0.36        0.79             +-----------------+-------------+----------+---------+ Prox upper arm       0.39        0.41             +-----------------+-------------+----------+---------+ Mid upper arm        0.43        0.27   branching +-----------------+-------------+----------+---------+ Dist upper arm       0.31        0.26             +-----------------+-------------+----------+---------+ Antecubital fossa    0.39        0.29   branching +-----------------+-------------+----------+---------+ Prox forearm         0.22        0.27             +-----------------+-------------+----------+---------+ Mid forearm          0.22        0.31   branching +-----------------+-------------+----------+---------+ Dist forearm         0.24        0.28             +-----------------+-------------+----------+---------+ +-----------------+-------------+----------+---------+ Left Cephalic    Diameter (cm)Depth (cm)Findings  +-----------------+-------------+----------+---------+ Shoulder             0.23        0.98             +-----------------+-------------+----------+---------+ Prox upper arm       0.30        0.40   branching +-----------------+-------------+----------+---------+ Mid upper arm        0.22        0.36             +-----------------+-------------+----------+---------+ Dist upper arm       0.17        0.30   branching +-----------------+-------------+----------+---------+ Antecubital fossa    0.33        0.22             +-----------------+-------------+----------+---------+ Prox forearm         0.24        0.40  branching +-----------------+-------------+----------+---------+ Mid forearm          0.21        0.38              +-----------------+-------------+----------+---------+ Dist forearm         0.31        0.22             +-----------------+-------------+----------+---------+ +-----------------+-------------+----------+---------+ Left Basilic     Diameter (cm)Depth (cm)Findings  +-----------------+-------------+----------+---------+ Shoulder             0.45        1.00             +-----------------+-------------+----------+---------+ Mid upper arm        0.26        0.59   branching +-----------------+-------------+----------+---------+ Dist upper arm       0.25        0.53   branching +-----------------+-------------+----------+---------+ Antecubital fossa    0.14        0.63   branching +-----------------+-------------+----------+---------+ Prox forearm         0.17        0.20             +-----------------+-------------+----------+---------+ Mid forearm          0.07        0.17             +-----------------+-------------+----------+---------+ Distal forearm       0.12        0.26   branching +-----------------+-------------+----------+---------+ *See table(s) above for measurements and observations.  Diagnosing physician: Servando Snare MD Electronically signed by Servando Snare MD on 01/14/2020 at 8:14:57 PM.    Final    . amLODipine  10 mg Oral Daily  . aspirin EC  81 mg Oral Daily  . atorvastatin  80 mg Oral QHS  . carvedilol  25 mg Oral BID WC  . Chlorhexidine Gluconate Cloth  6 each Topical Q0600  . clopidogrel  75 mg Oral Daily  . dorzolamide-timolol  1 drop Right Eye BID  . heparin  5,000 Units Subcutaneous Q8H  . hydrALAZINE  50 mg Oral Q8H  . sodium chloride flush  10-40 mL Intracatheter Q12H  . sodium chloride flush  3 mL Intravenous Q12H    BMET    Component Value Date/Time   NA 141 01/15/2020 0744   NA 137 12/10/2019 1439   K 4.0 01/15/2020 0744   CL 109 01/15/2020 0744   CO2 21 (L) 01/15/2020 0744   GLUCOSE 80 01/15/2020 0744   BUN 60 (H)  01/15/2020 0744   BUN 80 (HH) 12/10/2019 1439   CREATININE 4.56 (H) 01/15/2020 0744   CALCIUM 7.9 (L) 01/15/2020 0744   GFRNONAA 12 (L) 01/15/2020 0744   GFRAA 14 (L) 01/15/2020 0744   CBC    Component Value Date/Time   WBC 7.0 01/15/2020 0744   RBC 2.89 (L) 01/15/2020 0744   HGB 8.5 (L) 01/15/2020 0744   HGB 9.6 (L) 12/10/2019 1439   HCT 26.4 (L) 01/15/2020 0744   HCT 28.4 (L) 12/10/2019 1439   PLT 154 01/15/2020 0744   PLT 285 12/10/2019 1439   MCV 91.3 01/15/2020 0744   MCV 89 12/10/2019 1439   MCH 29.4 01/15/2020 0744   MCHC 32.2 01/15/2020 0744   RDW 16.2 (H) 01/15/2020 0744   RDW 14.5 12/10/2019 1439   LYMPHSABS 1.1 12/24/2019 0852   LYMPHSABS 2.0 02/24/2018 1142   MONOABS 0.4 12/24/2019 8527  EOSABS 0.0 12/24/2019 0852   EOSABS 0.1 02/24/2018 1142   BASOSABS 0.0 12/24/2019 0852   BASOSABS 0.0 02/24/2018 1142     Assessment/Plan: 1. Progressive CKD stage V, now ESRD- underlying CKD is due to DM and HTN and has had a slowly progressive decline in his renal function over the past 7 months. He has failed outpatient diuretics with multiple hospitalizations for acute on chronic CHF. He has mild uremic symptoms with anorexia, fatigue, weight loss, and nausea. I discussed hemodialysis with Mr. Go and he states that he would want dialysis if he needed it. He is a marginal long term dialysis candidate given his multiple co-morbidities, especially his ischemic CMP as well as poor functional and nutritional status. 1. S/p RIJ TDC placement 01/14/20 by IR  2. First session of HD 01/14/20 and second on 01/15/20 3. Plan for 3rd HD session tomorrow due to census. 4. Await outpatient schedule for HD, likely [place] 5. ConsultedVVSfor vein mapping and placement of an AVF or AVG, plan for AVG on Tuesday per Dr. Donzetta Matters. 2. Acute on chronic anemia of CKD stage V- s/p blood transfusion and will need to start ESA. Follow H/H and transfuse prn 3. Hyperkalemia due to #1- responding to  medical management 4. hyperhidrosis- has been seen as an outpatient for this and is longstanding. 5. Acute on chronic combined systolic and diastolic CHF- improved with IV lasix 6. HTN- stable 7. Adult failure to thrive- pt with multiple admissions in last 7 months and decline in his functional and nutritional status. He is a marginal dialysis candidate and recommend Palliative care consult to help set goals/limits of care 8. Ischemic CMP- no chest pain.   Donetta Potts, MD [employer] [phone]

## 2020-01-16 NOTE — Progress Notes (Signed)
   Subjective: Discussed plan for fistula placed early next week. Patient voiced understanding. Wanted to know what the plan was for today and was informed that Nephrology will decide whether to have another dialuysis session today. He reports no additional complaints. All questions and concerns were addressed.   Objective:  Vital signs in last 24 hours: Vitals:   01/15/20 1715 01/15/20 2044 01/16/20 0424 01/16/20 0500  BP: (!) 158/75 (!) 151/72 (!) 152/66   Pulse: 65 66 63   Resp: 15 18 18    Temp: 98.8 F (37.1 C) 98.7 F (37.1 C) 99.4 F (37.4 C)   TempSrc: Oral Oral Oral   SpO2: 98% 96% 98%   Weight:    72.9 kg  Height:       General: Elderly male, NAD, laying in bed Cardiac: RRR, no m/r/g Pulmonary: CTABL, no wheezing, rhonchi, rales Skin: Right upper chest TDC in place, no bleeding noted around the area  Assessment/Plan:  Active Problems:   Acute on chronic anemia   Acute-on-chronic kidney injury (HCC)   Hyperkalemia   CKD (chronic kidney disease), stage V (HCC)   Goals of care, counseling/discussion   ESRD (end stage renal disease) ([place])  This is a 69 year old male with a history of HTN, HLD, DM, CKD stage 5 that has now progressed to ESRD, Combined HF who presented initially with concerns of SOB and excessive sweating that was noted to have worsening renal function and has now been started on dialysis.   Progressive CKD stage 5 to ESRD: Hyperkalemia: Had [facility] in place and underwent his first dialysis session 7/9. Nephrology assisting with management of starting dialysis. Vascular is on board and is planning fistula placement early next week. BMP today showed no emergent requirement for dialysis. K has improved with dialysis.   -Nephrology following, appreciate assistance -Vascular planning permanent access placement with fistula on Monday or Tuesday -Monitor BMP -Renal navigator assisting with dialysis OP  GOC:  Given progression to ESRD and starting dialysis will  need to have continued [place] discussion. Appears to have limited understanding of the lifelong need for dialysis now that this has started. Will need to make sure his encephalopathy has improved and continue to discuss.  -Palliative care is following, appreciate assistance.   Anemia of chronic disease: Due to his ESRD. Had mild bleeding from his port post dialysis, heparin was held. Hgb today 8.5, stable from yesterday's 8.2. -Monitor CBC -Continue SCDs  Hyperhidrosis: Will need outpatient follow up with dermatology.   HTN: -Continue hydralazine and coreg. BP remains elevated around 152/66.  -Resume home amlodipine  Chronic Combined Heart Failure:Euvolemic on exam.  -Continue to monitor  -Continue Coreg 25mg  BID  Prior to Admission Living Arrangement: Home Anticipated Discharge Location: TBD Barriers to Discharge: Continued medical management Dispo: Anticipated discharge in approximately 3-4 day(s).   [name], MD 01/16/2020, 6:35 AM Pager: [phone] After 5pm on weekdays and 1pm on weekends: On Call pager [phone]

## 2020-01-16 NOTE — Progress Notes (Signed)
   Daily Progress Note   Patient Name: Dennis Macias       Date: 01/16/2020 DOB: 1951/06/08  Age: 69 y.o. MRN#: [id number] Attending Physician: Dennis Ochs, MD Primary Care Physician: Dennis Hansen, MD Admit Date: 01/12/2020  Reason for Consultation/Follow-up: Establishing goals of care  Subjective: Patient patient resting in bed.  Denies pain or shortness of breath.  Reports he feels much better today. Inquiring about if he will undergo a dialysis session today.  Reviewed goals of care discussion.  Patient able to verbalize summary of discussion with continued expressed goals for full scope full aggressive care.  Remains in agreement with outpatient palliative.  Advanced directive document observed at the bedside.  Patient expressing plans to complete with [facility] in preparation for finalizing documents next week spiritual support team.  HPI: Palliative Care consult requested for goals of care discussion in this 69 y.o. 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 He presented to the ED with concerns of shortness of breath. During ED work-up patient required BiPAP support initially.  BNP 705, Potassium 5.7, BUN 103, Cr 6.30 Chest x-ray showed vascular congestion with mild interstitial edema and small effusions.  Patient was initiated on IV Lasix. Since admission patient has been followed by Nephrology. With recommendations for serial HD. HD cath was placed on 01/14/20.   Length of Stay: 3 days  Vital Signs: BP (!) 148/67 (BP Location: Left Arm)   Pulse 63   Temp 98.9 F (37.2 C) (Oral)   Resp 16   Ht 6' (1.829 m)   Wt 72.9 kg   SpO2 98%   BMI 21.80 kg/m  SpO2: SpO2: 98 % O2 Device: O2 Device: Room Air O2 Flow Rate: O2 Flow Rate (L/min): 2 L/min   Palliative Care Assessment & Plan    Code Status:  Full code  Goals of  Care/Recommendations:  Continue current plan of care per medical team  Goals are clear and set full code/full scope/aggressive levels of care  Patient requesting outpatient palliative support at discharge.  Pending completion of advance directive at patient's request.  Dennis Macias states Dennis Macias is his designated HCPOA.  PMT will continue to support and follow as needed.  Please call team line for urgent needs.  Prognosis: Unable to determine (guarded)  Discharge Planning: To Be Determined with outpatient palliative support  Thank you for allowing the Palliative Medicine Team to assist in the care of this patient.  Time Total: 25 min.   Visit consisted of counseling and education dealing with the complex and emotionally intense issues of symptom management and palliative care in the setting of serious and potentially life-threatening illness.Greater than 50%  of this time was spent counseling and coordinating care related to the above assessment and plan.  Alda Lea, AGPCNP-BC  Palliative Medicine Team [phone]

## 2020-01-17 LAB — CBC
HCT: 26.5 % — ABNORMAL LOW (ref 39.0–52.0)
Hemoglobin: 8.4 g/dL — ABNORMAL LOW (ref 13.0–17.0)
MCH: 29.1 pg (ref 26.0–34.0)
MCHC: 31.7 g/dL (ref 30.0–36.0)
MCV: 91.7 fL (ref 80.0–100.0)
Platelets: 130 10*3/uL — ABNORMAL LOW (ref 150–400)
RBC: 2.89 MIL/uL — ABNORMAL LOW (ref 4.22–5.81)
RDW: 15.3 % (ref 11.5–15.5)
WBC: 5.6 10*3/uL (ref 4.0–10.5)
nRBC: 0 % (ref 0.0–0.2)

## 2020-01-17 LAB — RENAL FUNCTION PANEL
Albumin: 2.1 g/dL — ABNORMAL LOW (ref 3.5–5.0)
Anion gap: 9 (ref 5–15)
BUN: 40 mg/dL — ABNORMAL HIGH (ref 8–23)
CO2: 24 mmol/L (ref 22–32)
Calcium: 8 mg/dL — ABNORMAL LOW (ref 8.9–10.3)
Chloride: 104 mmol/L (ref 98–111)
Creatinine, Ser: 3.87 mg/dL — ABNORMAL HIGH (ref 0.61–1.24)
GFR calc Af Amer: 17 mL/min — ABNORMAL LOW (ref >60)
GFR calc non Af Amer: 15 mL/min — ABNORMAL LOW (ref >60)
Glucose, Bld: 113 mg/dL — ABNORMAL HIGH (ref 70–99)
Phosphorus: 5.2 mg/dL — ABNORMAL HIGH (ref 2.5–4.6)
Potassium: 4.3 mmol/L (ref 3.5–5.1)
Sodium: 137 mmol/L (ref 135–145)

## 2020-01-17 LAB — GLUCOSE, CAPILLARY: Glucose-Capillary: 109 mg/dL — ABNORMAL HIGH (ref 70–99)

## 2020-01-17 MED ORDER — HEPARIN SODIUM (PORCINE) 1000 UNIT/ML IJ SOLN
INTRAMUSCULAR | Status: AC
  Filled 2020-01-17: qty 4

## 2020-01-17 MED ORDER — HEPARIN SODIUM (PORCINE) 1000 UNIT/ML DIALYSIS: 20.0000 [IU]/kg | INTRAMUSCULAR | Status: DC | PRN

## 2020-01-17 NOTE — Progress Notes (Signed)
Patient ID: Dennis Macias, male   DOB: [birth year], 69 y.o.   MRN: [id number] S: Feels well, no complaints. O:BP 127/62 (BP Location: Left Arm)   Pulse 62   Temp 98.7 F (37.1 C) (Oral)   Resp 17   Ht 6' (1.829 m)   Wt 72.9 kg   SpO2 96%   BMI 21.80 kg/m   Intake/Output Summary (Last 24 hours) at 01/17/2020 1122 Last data filed at 01/17/2020 1019 Gross per 24 hour  Intake 360 ml  Output 1200 ml  Net -840 ml   Intake/Output: I/O last 3 completed shifts: In: 480 [P.O.:480] Out: 1250 [Urine:1250]  Intake/Output this shift:  Total I/O In: 120 [P.O.:120] Out: 450 [Urine:450] Weight change:  Gen: NAD CVS: no rub Resp: cta Abd: benign Ext: trace pretibial edema  Recent Labs  Lab 01/12/20 2335 01/13/20 0107 01/13/20 0136 01/13/20 1010 01/14/20 0312 01/15/20 0744  NA 141  --  143 141 142 141  K 5.7*  --  5.7* 5.2* 5.2* 4.0  CL 112*  --   --  112* 112* 109  CO2 15*  --   --  14* 16* 21*  GLUCOSE 190*  --   --  108* 83 80  BUN 103*  --   --  100* 101* 60*  CREATININE 6.30*  --   --  6.24* 6.54* 4.56*  ALBUMIN  --  2.7*  --  2.6*  --  2.4*  CALCIUM 8.4*  --   --  9.9 8.3* 7.9*  PHOS  --   --   --   --   --  6.1*  AST  --  23  --  17  --   --   ALT  --  23  --  19  --   --    Liver Function Tests: Recent Labs  Lab 01/13/20 0107 01/13/20 1010 01/15/20 0744  AST 23 17  --   ALT 23 19  --   ALKPHOS 66 57  --   BILITOT 0.5 0.6  --   PROT 5.8* 5.5*  --   ALBUMIN 2.7* 2.6* 2.4*   No results for input(s): LIPASE, AMYLASE in the last 168 hours. No results for input(s): AMMONIA in the last 168 hours. CBC: Recent Labs  Lab 01/12/20 2335 01/13/20 0136 01/13/20 1010 01/13/20 1622 01/14/20 0312 01/15/20 0744 01/17/20 1055  WBC 5.2   < > 5.5  --  6.4 7.0 5.6  HGB 7.5*   < > 7.1*   < > 8.2* 8.5* 8.4*  HCT 23.8*   < > 23.6*   < > 26.0* 26.4* 26.5*  MCV 93.7  --  96.3  --  93.2 91.3 91.7  PLT 148*   < > 139*  --  141* 154 130*   < > = values in this interval not  displayed.   Cardiac Enzymes: No results for input(s): CKTOTAL, CKMB, CKMBINDEX, TROPONINI in the last 168 hours. CBG: Recent Labs  Lab 01/14/20 0810 01/14/20 1538 01/15/20 0801 01/16/20 0926 01/17/20 0805  GLUCAP 73 77 74 109* 109*    Iron Studies: No results for input(s): IRON, TIBC, TRANSFERRIN, FERRITIN in the last 72 hours. Studies/Results: IR Fluoro Guide CV Line Right  Result Date: 01/14/2020 INDICATION: 69 year old with end-stage renal disease and needs a hemodialysis catheter. EXAM: FLUOROSCOPIC AND ULTRASOUND GUIDED PLACEMENT OF A TUNNELED DIALYSIS CATHETER Physician: Stephan Minister. Anselm Pancoast, MD MEDICATIONS: Ancef 2 g; The antibiotic was administered within an appropriate time interval prior to  skin puncture. ANESTHESIA/SEDATION: Versed 1.0 mg IV; Fentanyl 25 mcg IV; Moderate Sedation Time:  26 minutes The patient was continuously monitored during the procedure by the interventional radiology nurse under my direct supervision. FLUOROSCOPY TIME:  Fluoroscopy Time: 24 seconds, 3 mGy COMPLICATIONS: None immediate. PROCEDURE: Informed consent was obtained for placement of a tunneled dialysis catheter. The patient was placed supine on the interventional table. Ultrasound confirmed a patent right internal jugular vein. Ultrasound image was obtained for documentation. The right neck and chest was prepped and draped in a sterile fashion. Maximal barrier sterile technique was utilized including caps, mask, sterile gowns, sterile gloves, sterile drape, hand hygiene and skin antiseptic. The right neck was anesthetized with 1% lidocaine. A small incision was made with #11 blade scalpel. A 21 gauge needle directed into the right internal jugular vein with ultrasound guidance. A micropuncture dilator set was placed. A 23 cm tip to cuff Palindrome catheter was selected. The skin below the right clavicle was anesthetized and a small incision was made with an #11 blade scalpel. A subcutaneous tunnel was formed to  the vein dermatotomy site. The catheter was brought through the tunnel. The vein dermatotomy site was dilated to accommodate a peel-away sheath. The catheter was placed through the peel-away sheath and directed into the central venous structures. The tip of the catheter was placed at the superior cavoatrial junction with fluoroscopy. Fluoroscopic images were obtained for documentation. Both lumens were found to aspirate and flush well. The proper amount of heparin was flushed in both lumens. The vein dermatotomy site was closed using a single layer of absorbable suture and Dermabond. Gel-Foam was placed in subcutaneous tract. The catheter was secured to the skin using Prolene suture. IMPRESSION: Successful placement of a right jugular tunneled dialysis catheter using ultrasound and fluoroscopic guidance. Electronically Signed   By: Markus Daft M.D.   On: 01/14/2020 15:48   IR US Guide Vasc Access Right  Result Date: 01/15/2020 INDICATION: 69 year old with end-stage renal disease and needs a hemodialysis catheter.  EXAM: FLUOROSCOPIC AND ULTRASOUND GUIDED PLACEMENT OF A TUNNELED DIALYSIS CATHETER  Physician: Stephan Minister. Anselm Pancoast, MD  MEDICATIONS: Ancef 2 g; The antibiotic was administered within an appropriate time interval prior to skin puncture.  ANESTHESIA/SEDATION: Versed 1.0 mg IV; Fentanyl 25 mcg IV;  Moderate Sedation Time:  26 minutes  The patient was continuously monitored during the procedure by the interventional radiology nurse under my direct supervision.  FLUOROSCOPY TIME:  Fluoroscopy Time: 24 seconds, 3 mGy  COMPLICATIONS: None immediate.  PROCEDURE: Informed consent was obtained for placement of a tunneled dialysis catheter. The patient was placed supine on the interventional table. Ultrasound confirmed a patent right internal jugular vein. Ultrasound image was obtained for documentation. The right neck and chest was prepped and draped in a sterile fashion. Maximal barrier sterile technique was  utilized including caps, mask, sterile gowns, sterile gloves, sterile drape, hand hygiene and skin antiseptic. The right neck was anesthetized with 1% lidocaine. A small incision was made with #11 blade scalpel. A 21 gauge needle directed into the right internal jugular vein with ultrasound guidance. A micropuncture dilator set was placed. A 23 cm tip to cuff Palindrome catheter was selected. The skin below the right clavicle was anesthetized and a small incision was made with an #11 blade scalpel. A subcutaneous tunnel was formed to the vein dermatotomy site. The catheter was brought through the tunnel. The vein dermatotomy site was dilated to accommodate a peel-away sheath. The catheter was placed through  the peel-away sheath and directed into the central venous structures. The tip of the catheter was placed at the superior cavoatrial junction with fluoroscopy. Fluoroscopic images were obtained for documentation. Both lumens were found to aspirate and flush well. The proper amount of heparin was flushed in both lumens. The vein dermatotomy site was closed using a single layer of absorbable suture and Dermabond. Gel-Foam was placed in subcutaneous tract. The catheter was secured to the skin using Prolene suture.  IMPRESSION: Successful placement of a right jugular tunneled dialysis catheter using ultrasound and fluoroscopic guidance.   Electronically Signed   By: Markus Daft M.D.   On: 01/14/2020 15:48   . amLODipine  10 mg Oral Daily  . aspirin EC  81 mg Oral Daily  . atorvastatin  80 mg Oral QHS  . carvedilol  25 mg Oral BID WC  . Chlorhexidine Gluconate Cloth  6 each Topical Q0600  . clopidogrel  75 mg Oral Daily  . dorzolamide-timolol  1 drop Right Eye BID  . heparin  5,000 Units Subcutaneous Q8H  . hydrALAZINE  50 mg Oral Q8H  . sodium chloride flush  10-40 mL Intracatheter Q12H  . sodium chloride flush  3 mL Intravenous Q12H    BMET    Component Value Date/Time   NA 141 01/15/2020 0744    NA 137 12/10/2019 1439   K 4.0 01/15/2020 0744   CL 109 01/15/2020 0744   CO2 21 (L) 01/15/2020 0744   GLUCOSE 80 01/15/2020 0744   BUN 60 (H) 01/15/2020 0744   BUN 80 (HH) 12/10/2019 1439   CREATININE 4.56 (H) 01/15/2020 0744   CALCIUM 7.9 (L) 01/15/2020 0744   GFRNONAA 12 (L) 01/15/2020 0744   GFRAA 14 (L) 01/15/2020 0744   CBC    Component Value Date/Time   WBC 5.6 01/17/2020 1055   RBC 2.89 (L) 01/17/2020 1055   HGB 8.4 (L) 01/17/2020 1055   HGB 9.6 (L) 12/10/2019 1439   HCT 26.5 (L) 01/17/2020 1055   HCT 28.4 (L) 12/10/2019 1439   PLT 130 (L) 01/17/2020 1055   PLT 285 12/10/2019 1439   MCV 91.7 01/17/2020 1055   MCV 89 12/10/2019 1439   MCH 29.1 01/17/2020 1055   MCHC 31.7 01/17/2020 1055   RDW 15.3 01/17/2020 1055   RDW 14.5 12/10/2019 1439   LYMPHSABS 1.1 12/24/2019 0852   LYMPHSABS 2.0 02/24/2018 1142   MONOABS 0.4 12/24/2019 0852   EOSABS 0.0 12/24/2019 0852   EOSABS 0.1 02/24/2018 1142   BASOSABS 0.0 12/24/2019 0852   BASOSABS 0.0 02/24/2018 1142    Assessment/Plan: 1. Progressive CKD stage V, now ESRD- underlying CKD is due to DM and HTN and has had a slowly progressive decline in his renal function over the past 7 months. He has failed outpatient diuretics with multiple hospitalizations for acute on chronic CHF. He has mild uremic symptoms with anorexia, fatigue, weight loss, and nausea. I discussed hemodialysis with Mr. Boulet and he states that he would want dialysis if he needed it. He is a marginal long term dialysis candidate given his multiple co-morbidities, especially his ischemic CMP as well as poor functional and nutritional status. 1. S/p RIJTDC placement7/8/21 by IR  2. First session of HD 01/14/20 and second on 01/15/20 3. Plan for 3rd HD session today due to census. 4. Await outpatient schedule for HD, likely [facility] 5. ConsultedVVSfor vein mapping and placement of an AVF or AVG, plan for AVG on Tuesday per Dr. Donzetta Matters. 2. Acute on  chronic anemia  of CKD stage V- s/p blood transfusion and will need to start ESA. Follow H/H and transfuse prn.   1. Given sq dose 01/16/20 since we don't know his schedule for outpatient HD yet. 3. Hyperkalemia due to #1- responding to medical management 4. hyperhidrosis- has been seen as an outpatient for this and is longstanding. 5. Acute on chronic combined systolic and diastolic CHF- improved with IV lasix 6. HTN- stable 7. Adult failure to thrive- pt with multiple admissions in last 7 months and decline in his functional and nutritional status. He is a marginal dialysis candidate and recommend Palliative care consult to help set goals/limits of care 8. Ischemic CMP- no chest pain.   Donetta Potts, MD [employer] [phone]

## 2020-01-17 NOTE — Progress Notes (Signed)
   Subjective: Patient reports that he is feeling okay today. Has been eating and drinking normally. He denies any new complaints today. Discussed that he is planned to go to dialysis today and have the vascular surgery do the AVF graft placed on Tuesday.   Objective:  Vital signs in last 24 hours: Vitals:   01/16/20 0928 01/16/20 1301 01/16/20 2115 01/17/20 0553  BP: (!) 148/67 129/65 132/64 138/66  Pulse: 63  (!) 56 (!) 58  Resp: 16  18 18   Temp: 98.9 F (37.2 C)  98.9 F (37.2 C) 98.5 F (36.9 C)  TempSrc: Oral  Oral Oral  SpO2:   98% 99%  Weight:      Height:       General: Elderly male, NAD, laying in bed Cardiac: RRR, systolic murmur, no rubs or gallops Pulmonary: CTABL, no wheezing, rhonchi, rales Skin: Right upper chest TDC in place, no obvious evidence of bleeding  Assessment/Plan:  Active Problems:   Acute on chronic anemia   Acute-on-chronic kidney injury (HCC)   Hyperkalemia   CKD (chronic kidney disease), stage V (HCC)   Goals of care, counseling/discussion   ESRD (end stage renal disease) ([place])  This is a 69 year old male with a history of HTN, HLD, DM, CKD stage 5 that has now progressed to ESRD, Combined HF who presented initially with concerns of SOB and excessive sweating that was noted to have worsening renal function and has now been started on dialysis.   Progressive CKD stage 5 to ESRD: Hyperkalemia: Had TDC placed on 7/8. First dialysis session 7/8, second 7/9. Plan for dialysis today per nephrology. Nephrology assisting with management of starting dialysis. Vascular is on board and is planning fistula placement early next week, appears to be scheduled for AVG fistula on Tuesday.   -Nephrology following, appreciate assistance -Vascular planning permanent access placement with fistula on Tuesday -Monitor BMP -Renal navigator assisting with dialysis OP  GOC:  Given progression to ESRD and starting dialysis will need to have continued [place] discussion.  Appears to have limited understanding of the lifelong need for dialysis. He expressed understanding that he will need an outpatient dialysis center.    -Palliative care is following, appreciate assistance.   Anemia of chronic disease: Due to his ESRD. Had mild bleeding from his port post dialysis, heparin was held. Hgb has been stable, resumed his heparin.   -Monitor CBC -Continue SCDs  Hyperhidrosis: Will need outpatient follow up with dermatology.   HTN: -Continue hydralazine, coreg, and amlodipine. BP has improved, most recent was 138/66.   Chronic Combined Heart Failure:Euvolemic on exam.  -Continue to monitor  -Continue Coreg 25mg  BID  Prior to Admission Living Arrangement: Home Anticipated Discharge Location: TBD Barriers to Discharge: Continued medical management Dispo: Anticipated discharge in approximately 2-3 day(s).   [name], MD 01/17/2020, 6:13 AM Pager: [phone] After 5pm on weekdays and 1pm on weekends: On Call pager [phone]

## 2020-01-17 NOTE — Plan of Care (Signed)

## 2020-01-18 LAB — GLUCOSE, CAPILLARY
Glucose-Capillary: 89 mg/dL (ref 70–99)
Glucose-Capillary: 154 mg/dL — ABNORMAL HIGH (ref 70–99)

## 2020-01-18 MED ORDER — VANCOMYCIN HCL IN DEXTROSE 1-5 GM/200ML-% IV SOLN
1000.0000 mg | INTRAVENOUS | Status: AC
  Administered 2020-01-19: 1000 mg via INTRAVENOUS
  Filled 2020-01-18: qty 200

## 2020-01-18 NOTE — Progress Notes (Addendum)
PALLIATIVE NOTE:  Received a call today from [facility] requesting updates and possible family meeting to be scheduled with patient's significant other and/or children. After long discussion wife had additional questions regarding role as HCPOA and long-term expectations for patient's health.   I was able to reach Dixon Boos (patient did provide permission to speak with Ms. Pio) by phone. I introduced myself and Palliative's role in Mr. Bazar care while hospitalized as well as the role of outpatient palliative which he had requested at discharge. [name] verbalized understanding.   Discussed at length with [name] regarding patient's wishes for her to be his HCPOA however she expressed wishes to further discuss with patient and his 2 children. She shared patient rarely discuss his health or health care wishes and wanted to be clear of what his expectations of her and their children are in any situation. Support provided.   I briefly provided updates to [facility] regarding his current illness. She continued to express confusion based on previous discussions with other providers and what is to be expected in the future.   I offered to arrange a family meeting at the bedside to include her and their 2 children. She is aware once meeting is scheduled and confirmed all family would be allowed to come into hospital for meeting only, however not on a continued basis.   [name] expressed concerns with the ability to have all 3 available now stating they all have different schedules and different priorities. Support given. Education provided on the importance of all family being involved as she requested to gain awareness of patient's wishes and to receive updates on current illness/co-morbidities. Also with emphasis on the importance of family's support. She verbalized understanding again expressing the difficulty in this, however plans to see if she could speak with them today and get a better understanding of their  availability.  I provided Ms. Manning with several options 1) speak with their children and they collaborate on a time this week that would allow them all to be present at the bedside. I requested they considered several time options to allow for myself or another Palliative provider to meet their needs. 2) Determine a time that she would be available at bedside and at least one of the children. Family could then relay information to the other child that may not be available. 3) Determine a time they would all be available via conference call and I can be at the bedside with patient. 4) If the children are not available during appropriate hours, requested  Ms. Klosinski determine a time that she is available and she could then relay the information to their children.   Ms. Krzyzanowski verbalized understanding. She has my contact information and states she is hopeful she can call tomorrow with a meeting plan. Support given.   All questions answered.   Time Total: 45 min.   Visit consisted of counseling and education dealing with the complex and emotionally intense issues of symptom management and palliative care in the setting of serious and potentially life-threatening illness.Greater than 50%  of this time was spent counseling and coordinating care related to the above assessment and plan.  Alda Lea, AGPCNP-BC  Palliative Medicine Team [phone]

## 2020-01-18 NOTE — Progress Notes (Signed)
Subjective: Interviewed patient at bedside. Patient denies any pain around temporary port site. No complaints at the moment; denies leg pain. Discussed plan for Vascular to see him tomorrow for permanent dialysis access. The procedure is planned for 7/13 at 9AM.  Asked if he had any questions and he did not.  Objective:  Vital signs in last 24 hours: Vitals:   01/17/20 1520 01/17/20 1535 01/17/20 1932 01/18/20 0503  BP: 139/64 (!) 146/64 130/61 136/60  Pulse: (!) 57 (!) 59 62 67  Resp: 16 17 16 16   Temp: 98.6 F (37 C) 99 F (37.2 C) 98.9 F (37.2 C) 99.4 F (37.4 C)  TempSrc: Axillary Oral Oral Oral  SpO2:  98% 99% 97%  Weight:      Height:       Physical Exam Vitals and nursing note reviewed.  Constitutional:      General: He is not in acute distress.    Appearance: He is normal weight. He is not ill-appearing or toxic-appearing.  HENT:     Head: Normocephalic and atraumatic.  Eyes:     Extraocular Movements: Extraocular movements intact.  Cardiovascular:     Rate and Rhythm: Normal rate and regular rhythm.     Pulses: Normal pulses.          Radial pulses are 2+ on the right side and 2+ on the left side.       Dorsalis pedis pulses are 2+ on the right side and 2+ on the left side.     Heart sounds: Normal heart sounds, S1 normal and S2 normal. No murmur heard.   Pulmonary:     Effort: Pulmonary effort is normal. No respiratory distress.     Breath sounds: No wheezing.  Abdominal:     General: Abdomen is flat. There is no distension.     Palpations: Abdomen is soft. There is no mass.     Tenderness: There is no abdominal tenderness.  Musculoskeletal:     Right lower leg: No edema.     Left lower leg: No edema.  Neurological:     General: No focal deficit present.     Mental Status: He is alert. Mental status is at baseline.      Assessment/Plan: Dennis Macias is a 69yr old male with PMHx of DM, HTN, HLD, CKD, CVA, CHF,with arecent hospitalization for HT  emergency and HF exacerbation on 6/18.  Active Problems:   Acute on chronic anemia   Acute-on-chronic kidney injury ([place])   Hyperkalemia   CKD (chronic kidney disease), stage V (HCC)   Goals of care, counseling/discussion   ESRD (end stage renal disease) ([place])  Progressive CKD stage 5 to ESRD: Hyperkalemia: Had TDC placed on 7/8. First dialysis session 7/8, second 7/9. Nephrology assisting with management of starting dialysis. Vascular is on board and is planning fistula placement early next week, appears to be scheduled for AVG fistula on Tuesday.  -Nephrology following, appreciate assistance -Vascular planning permanent access placement with fistula on Right arm tomorrow -Monitor BMP -Renal navigator assisting with dialysis OP   GOC:  Given progression to ESRD and starting dialysis will need to have continued [place] discussion. Appears to have limited understanding of the lifelong need for dialysis. He expressed understanding that he will need an outpatient dialysis center.   -Palliative care is following, appreciate assistance.    Anemia of chronic disease: Due to his ESRD. Had mild bleeding from his port post dialysis, heparin was held. Hgb has been stable, resumed  his heparin.  -trend CBC -Continue SCDs   Hyperhidrosis: Will need outpatient follow up with dermatology.    HTN: -Continue hydralazine, coreg, and amlodipine. BP has improved and is stable    Chronic Combined Heart Failure:Euvolemic on exam.  -Continue to monitor  -Continue Coreg 25mg  BID  Prior to Admission Living Arrangement: Home Anticipated Discharge Location: TBD Barriers to Discharge: Continued medical management Dispo: Anticipated discharge in approximately 1-2 day(s).    Briant Cedar, MD 01/18/2020, 6:24 AM Pager: [phone] After 5pm on weekdays and 1pm on weekends: On Call pager [phone]

## 2020-01-18 NOTE — Progress Notes (Signed)
The chaplain responded to a consult for advance directives. The chaplain provided the patient with the AD brochure. The patient expressed a desire to discuss the paperwork with the family. The chaplain will follow-up when requested by the patient.   Brion Aliment Chaplain Resident For questions concerning this note please contact me by pager [phone]

## 2020-01-18 NOTE — Progress Notes (Signed)
Pre-op orders placed.   Npo after MN/consent/labs ordered.  Plan for right arm AV fistula vs AV graft tomorrow by Dr. Donzetta Matters.  Leontine Locket, [facility] 01/18/2020 11:36 AM

## 2020-01-18 NOTE — Plan of Care (Signed)
  Problem: Education: Goal: Knowledge of General Education information will improve Description Including pain rating scale, medication(s)/side effects and non-pharmacologic comfort measures Outcome: Progressing   Problem: Health Behavior/Discharge Planning: Goal: Ability to manage health-related needs will improve Outcome: Progressing   

## 2020-01-18 NOTE — Progress Notes (Signed)
Patient ID: Dennis Macias, male   DOB: 04-22-1951, 69 y.o.   MRN: [id number] S: Feels well, no complaints.  No family present.  O:BP 134/62 (BP Location: Left Arm)   Pulse 67   Temp 99.3 F (37.4 C) (Oral)   Resp 17   Ht 6' (1.829 m)   Wt 73.3 kg   SpO2 100%   BMI 21.92 kg/m   Intake/Output Summary (Last 24 hours) at 01/18/2020 1111 Last data filed at 01/18/2020 0645 Gross per 24 hour  Intake 183 ml  Output 1200 ml  Net -1017 ml   Intake/Output: I/O last 3 completed shifts: In: 303 [P.O.:300; I.V.:3] Out: 2050 [Urine:1050; Other:1000]  Intake/Output this shift:  No intake/output data recorded. Weight change:  Gen: NAD CVS: no rub Resp: cta Abd: benign Ext: no edema  Recent Labs  Lab 01/12/20 2335 01/13/20 0107 01/13/20 0136 01/13/20 1010 01/14/20 0312 01/15/20 0744 01/17/20 1055  NA 141  --  143 141 142 141 137  K 5.7*  --  5.7* 5.2* 5.2* 4.0 4.3  CL 112*  --   --  112* 112* 109 104  CO2 15*  --   --  14* 16* 21* 24  GLUCOSE 190*  --   --  108* 83 80 113*  BUN 103*  --   --  100* 101* 60* 40*  CREATININE 6.30*  --   --  6.24* 6.54* 4.56* 3.87*  ALBUMIN  --  2.7*  --  2.6*  --  2.4* 2.1*  CALCIUM 8.4*  --   --  9.9 8.3* 7.9* 8.0*  PHOS  --   --   --   --   --  6.1* 5.2*  AST  --  23  --  17  --   --   --   ALT  --  23  --  19  --   --   --    Liver Function Tests: Recent Labs  Lab 01/13/20 0107 01/13/20 0107 01/13/20 1010 01/15/20 0744 01/17/20 1055  AST 23  --  17  --   --   ALT 23  --  19  --   --   ALKPHOS 66  --  57  --   --   BILITOT 0.5  --  0.6  --   --   PROT 5.8*  --  5.5*  --   --   ALBUMIN 2.7*   < > 2.6* 2.4* 2.1*   < > = values in this interval not displayed.   No results for input(s): LIPASE, AMYLASE in the last 168 hours. No results for input(s): AMMONIA in the last 168 hours. CBC: Recent Labs  Lab 01/12/20 2335 01/13/20 0136 01/13/20 1010 01/13/20 1622 01/14/20 0312 01/15/20 0744 01/17/20 1055  WBC 5.2   < > 5.5  --  6.4 7.0  5.6  HGB 7.5*   < > 7.1*   < > 8.2* 8.5* 8.4*  HCT 23.8*   < > 23.6*   < > 26.0* 26.4* 26.5*  MCV 93.7  --  96.3  --  93.2 91.3 91.7  PLT 148*   < > 139*  --  141* 154 130*   < > = values in this interval not displayed.   Cardiac Enzymes: No results for input(s): CKTOTAL, CKMB, CKMBINDEX, TROPONINI in the last 168 hours. CBG: Recent Labs  Lab 01/14/20 1538 01/15/20 0801 01/16/20 0926 01/17/20 0805 01/18/20 0630  GLUCAP 77 74 109* 109* 89  Iron Studies: No results for input(s): IRON, TIBC, TRANSFERRIN, FERRITIN in the last 72 hours. Studies/Results: No results found. Marland Kitchen amLODipine  10 mg Oral Daily  . aspirin EC  81 mg Oral Daily  . atorvastatin  80 mg Oral QHS  . carvedilol  25 mg Oral BID WC  . Chlorhexidine Gluconate Cloth  6 each Topical Q0600  . clopidogrel  75 mg Oral Daily  . dorzolamide-timolol  1 drop Right Eye BID  . heparin  5,000 Units Subcutaneous Q8H  . hydrALAZINE  50 mg Oral Q8H  . sodium chloride flush  10-40 mL Intracatheter Q12H  . sodium chloride flush  3 mL Intravenous Q12H    BMET    Component Value Date/Time   NA 137 01/17/2020 1055   NA 137 12/10/2019 1439   K 4.3 01/17/2020 1055   CL 104 01/17/2020 1055   CO2 24 01/17/2020 1055   GLUCOSE 113 (H) 01/17/2020 1055   BUN 40 (H) 01/17/2020 1055   BUN 80 (HH) 12/10/2019 1439   CREATININE 3.87 (H) 01/17/2020 1055   CALCIUM 8.0 (L) 01/17/2020 1055   GFRNONAA 15 (L) 01/17/2020 1055   GFRAA 17 (L) 01/17/2020 1055   CBC    Component Value Date/Time   WBC 5.6 01/17/2020 1055   RBC 2.89 (L) 01/17/2020 1055   HGB 8.4 (L) 01/17/2020 1055   HGB 9.6 (L) 12/10/2019 1439   HCT 26.5 (L) 01/17/2020 1055   HCT 28.4 (L) 12/10/2019 1439   PLT 130 (L) 01/17/2020 1055   PLT 285 12/10/2019 1439   MCV 91.7 01/17/2020 1055   MCV 89 12/10/2019 1439   MCH 29.1 01/17/2020 1055   MCHC 31.7 01/17/2020 1055   RDW 15.3 01/17/2020 1055   RDW 14.5 12/10/2019 1439   LYMPHSABS 1.1 12/24/2019 0852   LYMPHSABS 2.0  02/24/2018 1142   MONOABS 0.4 12/24/2019 0852   EOSABS 0.0 12/24/2019 0852   EOSABS 0.1 02/24/2018 1142   BASOSABS 0.0 12/24/2019 0852   BASOSABS 0.0 02/24/2018 1142    Assessment/Plan: 1. Progressive CKD stage V, now ESRD- underlying CKD is due to DM and HTN and has had a slowly progressive decline in his renal function over the past 7 months. He has failed outpatient diuretics with multiple hospitalizations for acute on chronic CHF. Options were discussed with him and he wished to proceed with HD. He is a marginal long term dialysis candidate given his multiple co-morbidities, especially his ischemic CMP as well as poor functional and nutritional status. 1. S/p RIJTDC placement7/8/21 by IR  2. First session of HD 01/14/20 and second on 01/15/20, 3rd was 7/11 3. Plan for next HD tomorrow but depending on outpt schedule will adapt to that prior to discharge 4. Await outpatient schedule for HD, likely [place] 5. ConsultedVVSfor vein mapping and placement of an AVF or AVG, plan for AVG on Tuesday per Dr. Donzetta Matters. 2. Acute on chronic anemia of CKD stage V- s/p blood transfusion and will need to start ESA. Follow H/H and transfuse prn.   1. Given sq dose 01/16/20 since we don't know his schedule for outpatient HD yet. 3. Hyperkalemia due to #1- responding to medical management 4. hyperhidrosis- has been seen as an outpatient for this and is longstanding. 5. Acute on chronic combined systolic and diastolic CHF- improved with IV lasix and now HD 6. HTN- stable 7. Adult failure to thrive- pt with multiple admissions in last 7 months and decline in his functional and nutritional status. He is a  marginal dialysis candidate and recommend Palliative care consult to help set goals/limits of care 8. Ischemic CMP- no chest pain.  [name] [facility] Pager [phone]

## 2020-01-18 NOTE — Progress Notes (Signed)
Renal Navigator received returned call from patient's significant other/Dennis Macias. She immediately reports that she and patient have been together for 30 years, but it is a coincidence that they have the same last name and have never been married. She states they have two grown children together. Dennis Macias reports that she is not able to take patient to OP HD because she works. She states "he told me he would have a ride." Dennis Macias seems fearful throughout the conversation about what patient is going through and made statements about not knowing anything about his health and was tearful at times. She told Navigator that she does not know what his wishes are and would not know how to carry them out if she was ever called on to do so. Navigator spoke with Dennis Macias at length about HD as life support and patient most likely not living much longer without it, though Navigator is unable to speak to a length of time. Navigator able to get Nephrologist to speak with patient and family if desired as it is noted that patient has missed many follow up appointments due to frequent hospitalizations. Dennis Macias confirmed this. Navigator explained that with a dx of ESRD, it is likely that patient will require HD for the rest of his life and that [place] are important as it is necessary to continue to evaluate quality of life, as there may come a time when stopping HD is appropriate. Dennis Macias states that patient will not talk about his wishes, even as to where he wants to be buried. Dennis Macias reports that she does not know what Palliative Care is, when Navigator mentioned that PMT has been involved to start the [place] discussion. Navigator encouraged Dennis Macias to talk with patient about his wishes and include their children in the conversation, as until someone has been named his HCPOA, the responsibility would fall to his children since he and Dennis Macias are not legally married. She stated understanding. Navigator explained role of PMT and called N. Pickenpack-Cousar to  request further attempt to meet with patient and family together as there seems to be a breakdown in communication when patient states he will communicate with his family on his own-if patient is willing to meet with his family and PMT.  Navigator informed Dennis Macias that a referral for OP HD is still pending and that Navigator will follow up with patient to apply for Access [place]. She states they have started an application for Medicaid and understands from this conversation that he can use Medicaid Transportation for free if he is approved. She was not aware that the approval/denial process can take months.  Navigator will continue to follow closely.  Alphonzo Cruise, [place] Renal Navigator [phone]

## 2020-01-18 NOTE — Plan of Care (Signed)
  Problem: Nutrition: Goal: Adequate nutrition will be maintained Outcome: Progressing   Problem: Coping: Goal: Level of anxiety will decrease 01/18/2020 0109 by Marlowe Alt A, RN Outcome: Progressing 01/18/2020 0107 by Adaline Sill Lyn A, RN Outcome: Progressing   Problem: Pain Managment: Goal: General experience of comfort will improve Outcome: Progressing   Problem: Safety: Goal: Ability to remain free from injury will improve Outcome: Progressing   Problem: Skin Integrity: Goal: Risk for impaired skin integrity will decrease Outcome: Progressing   Problem: Education: Goal: Knowledge of General Education information will improve Description: Including pain rating scale, medication(s)/side effects and non-pharmacologic comfort measures Outcome: Progressing   Problem: Health Behavior/Discharge Planning: Goal: Ability to manage health-related needs will improve Outcome: Progressing

## 2020-01-18 NOTE — Plan of Care (Signed)

## 2020-01-19 ENCOUNTER — Inpatient Hospital Stay (HOSPITAL_COMMUNITY): Payer: Medicare PPO

## 2020-01-19 ENCOUNTER — Encounter (INDEPENDENT_AMBULATORY_CARE_PROVIDER_SITE_OTHER): Payer: Medicare PPO | Admitting: Ophthalmology

## 2020-01-19 ENCOUNTER — Encounter (HOSPITAL_COMMUNITY): Payer: Self-pay | Admitting: Internal Medicine

## 2020-01-19 ENCOUNTER — Encounter (HOSPITAL_COMMUNITY)
Admission: EM | Disposition: A | Discharge status: Home / Self Care | Payer: Self-pay | Source: Home / Self Care | Attending: Internal Medicine

## 2020-01-19 DIAGNOSIS — N185 Chronic kidney disease, stage 5: Secondary | ICD-10-CM | POA: Diagnosis not present

## 2020-01-19 HISTORY — PX: AV FISTULA PLACEMENT: SHX1204

## 2020-01-19 LAB — BASIC METABOLIC PANEL
Anion gap: 10 (ref 5–15)
BUN: 26 mg/dL — ABNORMAL HIGH (ref 8–23)
CO2: 25 mmol/L (ref 22–32)
Calcium: 8.2 mg/dL — ABNORMAL LOW (ref 8.9–10.3)
Chloride: 101 mmol/L (ref 98–111)
Creatinine, Ser: 3.34 mg/dL — ABNORMAL HIGH (ref 0.61–1.24)
GFR calc Af Amer: 21 mL/min — ABNORMAL LOW (ref >60)
GFR calc non Af Amer: 18 mL/min — ABNORMAL LOW (ref >60)
Glucose, Bld: 109 mg/dL — ABNORMAL HIGH (ref 70–99)
Potassium: 3.6 mmol/L (ref 3.5–5.1)
Sodium: 136 mmol/L (ref 135–145)

## 2020-01-19 LAB — SURGICAL PCR SCREEN
MRSA, PCR: NEGATIVE
Staphylococcus aureus: POSITIVE — AB

## 2020-01-19 LAB — CBC
HCT: 26.2 % — ABNORMAL LOW (ref 39.0–52.0)
Hemoglobin: 8.6 g/dL — ABNORMAL LOW (ref 13.0–17.0)
MCH: 29.8 pg (ref 26.0–34.0)
MCHC: 32.8 g/dL (ref 30.0–36.0)
MCV: 90.7 fL (ref 80.0–100.0)
Platelets: 135 10*3/uL — ABNORMAL LOW (ref 150–400)
RBC: 2.89 MIL/uL — ABNORMAL LOW (ref 4.22–5.81)
RDW: 14.6 % (ref 11.5–15.5)
WBC: 5.1 10*3/uL (ref 4.0–10.5)
nRBC: 0 % (ref 0.0–0.2)

## 2020-01-19 LAB — GLUCOSE, CAPILLARY
Glucose-Capillary: 100 mg/dL — ABNORMAL HIGH (ref 70–99)
Glucose-Capillary: 128 mg/dL — ABNORMAL HIGH (ref 70–99)

## 2020-01-19 SURGERY — ARTERIOVENOUS (AV) FISTULA CREATION
Anesthesia: Monitor Anesthesia Care | Site: Arm Upper | Laterality: Right

## 2020-01-19 MED ORDER — PROPOFOL 10 MG/ML IV BOLUS
INTRAVENOUS | Status: AC
  Filled 2020-01-19: qty 20

## 2020-01-19 MED ORDER — MIDAZOLAM HCL 5 MG/5ML IJ SOLN
INTRAMUSCULAR | Status: DC | PRN
  Administered 2020-01-19: 1 mg via INTRAVENOUS

## 2020-01-19 MED ORDER — LIDOCAINE 2% (20 MG/ML) 5 ML SYRINGE
INTRAMUSCULAR | Status: DC | PRN
  Administered 2020-01-19: 60 mg via INTRAVENOUS

## 2020-01-19 MED ORDER — 0.9 % SODIUM CHLORIDE (POUR BTL) OPTIME
TOPICAL | Status: DC | PRN
  Administered 2020-01-19: 1000 mL

## 2020-01-19 MED ORDER — SODIUM CHLORIDE 0.9 % IV SOLN
INTRAVENOUS | Status: AC
  Filled 2020-01-19: qty 1.2

## 2020-01-19 MED ORDER — LIDOCAINE-EPINEPHRINE (PF) 1 %-1:200000 IJ SOLN
INTRAMUSCULAR | Status: DC | PRN
  Administered 2020-01-19: 6 mL

## 2020-01-19 MED ORDER — FENTANYL CITRATE (PF) 250 MCG/5ML IJ SOLN
INTRAMUSCULAR | Status: DC | PRN
  Administered 2020-01-19 (×2): 50 ug via INTRAVENOUS

## 2020-01-19 MED ORDER — PROPOFOL 10 MG/ML IV BOLUS
INTRAVENOUS | Status: DC | PRN
  Administered 2020-01-19: 20 mg via INTRAVENOUS

## 2020-01-19 MED ORDER — PROPOFOL 500 MG/50ML IV EMUL
INTRAVENOUS | Status: DC | PRN
  Administered 2020-01-19: 50 ug/kg/min via INTRAVENOUS

## 2020-01-19 MED ORDER — MIDAZOLAM HCL 2 MG/2ML IJ SOLN
INTRAMUSCULAR | Status: AC
  Filled 2020-01-19: qty 2

## 2020-01-19 MED ORDER — LIDOCAINE-EPINEPHRINE (PF) 1 %-1:200000 IJ SOLN
INTRAMUSCULAR | Status: AC
  Filled 2020-01-19: qty 30

## 2020-01-19 MED ORDER — SODIUM CHLORIDE 0.9 % IV SOLN
INTRAVENOUS | Status: DC | PRN
  Administered 2020-01-19: 500 mL

## 2020-01-19 MED ORDER — FENTANYL CITRATE (PF) 250 MCG/5ML IJ SOLN
INTRAMUSCULAR | Status: AC
  Filled 2020-01-19: qty 5

## 2020-01-19 MED ORDER — MUPIROCIN 2 % EX OINT
TOPICAL_OINTMENT | Freq: Two times a day (BID) | CUTANEOUS | Status: DC
  Filled 2020-01-19: qty 22

## 2020-01-19 MED ORDER — SODIUM CHLORIDE 0.9 % IV SOLN
INTRAVENOUS | Status: DC | PRN
Start: 2020-01-19 — End: 2020-01-19

## 2020-01-19 MED ORDER — HYDROCODONE-ACETAMINOPHEN 5-325 MG PO TABS: 1.0000 | ORAL_TABLET | ORAL | Status: DC | PRN

## 2020-01-19 SURGICAL SUPPLY — 31 items
ARMBAND PINK RESTRICT EXTREMIT (MISCELLANEOUS) ×3 IMPLANT
CANISTER SUCT 3000ML PPV (MISCELLANEOUS) ×3 IMPLANT
CLIP VESOCCLUDE MED 6/CT (CLIP) ×3 IMPLANT
CLIP VESOCCLUDE SM WIDE 6/CT (CLIP) ×6 IMPLANT
COVER PROBE W GEL 5X96 (DRAPES) ×3 IMPLANT
COVER WAND RF STERILE (DRAPES) ×3 IMPLANT
DERMABOND ADVANCED (GAUZE/BANDAGES/DRESSINGS) ×1
DERMABOND ADVANCED .7 DNX12 (GAUZE/BANDAGES/DRESSINGS) ×2 IMPLANT
ELECT REM PT RETURN 9FT ADLT (ELECTROSURGICAL) ×3
ELECTRODE REM PT RTRN 9FT ADLT (ELECTROSURGICAL) ×2 IMPLANT
GLOVE BIO SURGEON STRL SZ7.5 (GLOVE) ×3 IMPLANT
GOWN STRL REUS W/ TWL LRG LVL3 (GOWN DISPOSABLE) IMPLANT
GOWN STRL REUS W/ TWL XL LVL3 (GOWN DISPOSABLE) ×6 IMPLANT
GOWN STRL REUS W/TWL LRG LVL3 (GOWN DISPOSABLE)
GOWN STRL REUS W/TWL XL LVL3 (GOWN DISPOSABLE) ×9
HEMOSTAT SNOW SURGICEL 2X4 (HEMOSTASIS) IMPLANT
INSERT FOGARTY SM (MISCELLANEOUS) IMPLANT
KIT BASIN OR (CUSTOM PROCEDURE TRAY) ×3 IMPLANT
KIT TURNOVER KIT B (KITS) ×3 IMPLANT
NS IRRIG 1000ML POUR BTL (IV SOLUTION) ×3 IMPLANT
PACK CV ACCESS (CUSTOM PROCEDURE TRAY) ×3 IMPLANT
PAD ARMBOARD 7.5X6 YLW CONV (MISCELLANEOUS) ×6 IMPLANT
SUT MNCRL AB 4-0 PS2 18 (SUTURE) ×3 IMPLANT
SUT PROLENE 6 0 BV (SUTURE) ×3 IMPLANT
SUT SILK 2 0 SH (SUTURE) IMPLANT
SUT VIC AB 3-0 SH 27 (SUTURE) ×3
SUT VIC AB 3-0 SH 27X BRD (SUTURE) ×2 IMPLANT
SYR TOOMEY 50ML (SYRINGE) IMPLANT
TOWEL GREEN STERILE (TOWEL DISPOSABLE) ×3 IMPLANT
UNDERPAD 30X36 HEAVY ABSORB (UNDERPADS AND DIAPERS) ×3 IMPLANT
WATER STERILE IRR 1000ML POUR (IV SOLUTION) ×3 IMPLANT

## 2020-01-19 NOTE — Progress Notes (Addendum)
0800 Pt to Short stay, A&O x3, hard of hearing. NPO maint.  1000 Received pt from PACU, A&O x3. New AV fistula to right AC incision with skin glue dry and intact.

## 2020-01-19 NOTE — Discharge Instructions (Signed)
° °  [facility] ° °Discharge Instructions ° °AV Fistula or Graft Surgery for Dialysis Access ° °Please refer to the following instructions for your post-procedure care. Your surgeon or physician assistant will discuss any changes with you. ° °Activity ° °You may drive the day following your surgery, if you are comfortable and no longer taking prescription pain medication. Resume full activity as the soreness in your incision resolves. ° °Bathing/Showering ° °You may shower after you go home. Keep your incision dry for 48 hours. Do not soak in a bathtub, hot tub, or swim until the incision heals completely. You may not shower if you have a hemodialysis catheter. ° °Incision Care ° °Clean your incision with mild soap and water after 48 hours. Pat the area dry with a clean towel. You do not need a bandage unless otherwise instructed. Do not apply any ointments or creams to your incision. You may have skin glue on your incision. Do not peel it off. It will come off on its own in about one week. Your arm may swell a bit after surgery. To reduce swelling use pillows to elevate your arm so it is above your heart. Your doctor will tell you if you need to lightly wrap your arm with an ACE bandage. ° °Diet ° °Resume your normal diet. There are not special food restrictions following this procedure. In order to heal from your surgery, it is CRITICAL to get adequate nutrition. Your body requires vitamins, minerals, and protein. Vegetables are the best source of vitamins and minerals. Vegetables also provide the perfect balance of protein. Processed food has little nutritional value, so try to avoid this. ° °Medications ° °Resume taking all of your medications. If your incision is causing pain, you may take over-the counter pain relievers such as acetaminophen (Tylenol). If you were prescribed a stronger pain medication, please be aware these medications can cause nausea and constipation. Prevent  nausea by taking the medication with a snack or meal. Avoid constipation by drinking plenty of fluids and eating foods with high amount of fiber, such as fruits, vegetables, and grains. Do not take Tylenol if you are taking prescription pain medications. ° ° ° ° °Follow up °Your surgeon may want to see you in the office following your access surgery. If so, this will be arranged at the time of your surgery. ° °Please call us immediately for any of the following conditions: ° °Increased pain, redness, drainage (pus) from your incision site °Fever of 101 degrees or higher °Severe or worsening pain at your incision site °Hand pain or numbness. ° °Reduce your risk of vascular disease: ° °Stop smoking. If you would like help, call [facility] at 1-800-QUIT-NOW (1-800-784-8669) or [place] at 336-586-4000 ° °Manage your cholesterol °Maintain a desired weight °Control your diabetes °Keep your blood pressure down ° °Dialysis ° °It will take several weeks to several months for your new dialysis access to be ready for use. Your surgeon will determine when it is OK to use it. Your nephrologist will continue to direct your dialysis. You can continue to use your Permcath until your new access is ready for use. ° °If you have any questions, please call the office at [phone]. ° °

## 2020-01-19 NOTE — Anesthesia Procedure Notes (Signed)
Procedure Name: MAC Date/Time: 01/19/2020 8:45 AM Performed by: Wilburn Cornelia, CRNA Pre-anesthesia Checklist: Patient identified, Emergency Drugs available, Suction available, Patient being monitored and Timeout performed Oxygen Delivery Method: Simple face mask Airway Equipment and Method: Oral airway Placement Confirmation: positive ETCO2 and breath sounds checked- equal and bilateral Dental Injury: Teeth and Oropharynx as per pre-operative assessment

## 2020-01-19 NOTE — Progress Notes (Signed)
PALLIATIVE NOTE:  Received a message from patient's significant other [facility]. She and daughter would like to meet Wednesday 01/20/20 @ 11am for continued [place] discussions.   Confirmed request and family is aware I will meet them at the bedside as requested. Both daughter and [name] will be allowed to visit patient in the setting of family meeting.   Alda Lea, AGPCNP-BC Palliative Medicine Team  Phone: [phone]  NO CHARGE

## 2020-01-19 NOTE — Transfer of Care (Signed)
Immediate Anesthesia Transfer of Care Note  Patient: Dennis Macias  Procedure(s) Performed: BRACHIOCEPHALIC ARTERIOVENOUS (AV) FISTULA CREATION (Right Arm Upper)  Patient Location: PACU  Anesthesia Type:MAC  Level of Consciousness: drowsy and patient cooperative  Airway & Oxygen Therapy: Patient Spontanous Breathing and Patient connected to face mask oxygen  Post-op Assessment: Report given to RN, Post -op Vital signs reviewed and stable and Patient moving all extremities  Post vital signs: Reviewed and stable  Last Vitals:  Vitals Value Taken Time  BP    Temp    Pulse    Resp    SpO2      Last Pain:  Vitals:   01/19/20 0748  TempSrc: Oral  PainSc:       Patients Stated Pain Goal: 0 (88/67/73 7366)  Complications: No complications documented.

## 2020-01-19 NOTE — TOC Initial Note (Signed)
Transition of Care [facility]) - Initial/Assessment Note    Patient Details  Name: Trell Secrist MRN: [id number] Date of Birth: 18-Dec-1950  [facility] CM/SW Contact:    Curlene Labrum, RN Phone Number: 01/19/2020, 2:32 PM  Clinical Narrative:                 Case management met with the patient regarding transitions of care for home.  The patient lives at home with his wife - who drives and works at [employer]  Patient is a S/P fistula repair and is waiting on family meeting tomorrow regarding palliative care support.  Patient uses a cane at home and plans on taking transportation to his dialysis visits once able.  AV fistula was placed on 7/13.  Will continue to follow and give support and contact for outpatient palliative care after family visit tomorrow.  Expected Discharge Plan: [place] Barriers to Discharge: Continued Medical Work up   Patient Goals and CMS Choice Patient states their goals for this hospitalization and ongoing recovery are:: Plan to discharge home when medically ready. CMS Medicare.gov Compare Post Acute Care list provided to:: Patient Choice offered to / list presented to : Patient  Expected Discharge Plan and Services Expected Discharge Plan: [place]   Discharge Planning Services: CM Consult Post Acute Care Choice:  (possible palliative care followup) Living arrangements for the past 2 months: Apartment                 DME Arranged:  (currently uses cane)           [place]: [facility] (currently active with [facility] for PT/RN) Date [facility] Agency Contacted: 01/19/20 Time [place]: [id number] Representative spoke with at [place]: [facility] to determine if active for [facility] with patient.  Prior Living Arrangements/Services Living arrangements for the past 2 months: Apartment Lives with:: Spouse Patient language and need for interpreter reviewed:: Yes Do you feel  safe going back to the place where you live?: Yes      Need for Family Participation in Patient Care: Yes (Comment) Care giver support system in place?: Yes (comment) Current home services: Home PT, Home RN, DME (Patient currently active with [facility]) Criminal Activity/Legal Involvement Pertinent to Current Situation/Hospitalization: No - Comment as needed  Activities of Daily Living Home Assistive Devices/Equipment: CBG Meter, Eyeglasses, Cane (specify quad or straight) ADL Screening (condition at time of admission) Patient's cognitive ability adequate to safely complete daily activities?: Yes Is the patient deaf or have difficulty hearing?: No Does the patient have difficulty seeing, even when wearing glasses/contacts?: Yes Does the patient have difficulty concentrating, remembering, or making decisions?: No Patient able to express need for assistance with ADLs?: Yes Does the patient have difficulty dressing or bathing?: No Independently performs ADLs?: Yes (appropriate for developmental age) Does the patient have difficulty walking or climbing stairs?: Yes Weakness of Legs: Both Weakness of Arms/Hands: None  Permission Sought/Granted Permission sought to share information with : Case Manager Permission granted to share information with : Yes, Verbal Permission Granted     Permission granted to share info w AGENCY: [place] granted to share info w Relationship: spouse     Emotional Assessment Appearance:: Appears stated age Attitude/Demeanor/Rapport: Gracious Affect (typically observed): Accepting Orientation: : Oriented to Self, Oriented to Place, Oriented to  Time, Oriented to Situation Alcohol / Substance Use: Not Applicable Psych Involvement: No (comment)  Admission diagnosis:  Acute-on-chronic kidney injury ([place]) [N17.9, N18.9] Acute renal failure superimposed on chronic kidney disease, unspecified CKD stage, unspecified acute renal failure type ([place]) [N17.9,  N18.9] Patient Active Problem List   Diagnosis Date Noted  . ESRD (end stage renal disease) ([place])   . CKD (chronic kidney disease), stage V ([place])   . Goals of care, counseling/discussion   . Acute-on-chronic kidney injury ([place]) 01/13/2020  . Hyperkalemia   . Shortness of breath 12/24/2019  . Acute on chronic combined systolic and diastolic CHF (congestive heart failure) ([place]) 12/24/2019  . Non-Obstructive CAD 12/14/2019  . Encephalopathy, metabolic 57/32/2567  . BPH (benign prostatic hyperplasia) 12/08/2019  . Anemia of chronic disease   . Protein-calorie malnutrition, severe 11/19/2019  . Acute ischemic left ICA stroke ([place])   . Gait disorder   . Chronic combined systolic and diastolic heart failure ([place])   . Non-STEMI (non-ST elevated myocardial infarction) ([place])   . Pleural effusion transudative   . Nephrotic syndrome 08/05/2019  . Protein calorie malnutrition ([place]) 07/31/2019  . Left renal mass 07/21/2019  . CKD (chronic kidney disease) stage 4, GFR 15-29 ml/min (HCC) 04/23/2019  . Weight loss, non-intentional 08/26/2017  . 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 Acute on chronic anemia 12/21/2016  . Hyperlipidemia 09/13/2015  . 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 Essential hypertension    PCP:  [name], MD Pharmacy:   [place], [place] - [address] [facility]. [address] [place] [address] Phone: [phone] Fax: [place] #[id number] [place], [place] - [address] [place] [address] Phone: [phone] Fax: [phone]  [place], [place] - [address] [place] [address] Phone: [phone] Fax: [phone]     Social Determinants of Health (SDOH) Interventions    Readmission Risk Interventions Readmission  Risk Prevention Plan 01/19/2020 11/10/2019 08/10/2019  Transportation Screening Complete Complete Complete  PCP or Specialist Appt within 3-5 Days - - Complete  HRI or [place] - - Complete  Social Work Consult for [place] Planning/Counseling - - Complete  Palliative Care Screening - - Complete  Medication Review Press photographer) Complete Complete Complete  PCP or Specialist appointment within 3-5 days of discharge Complete - -  [place] or Home Care Consult Complete Complete -  SW Recovery Care/Counseling Consult Complete Complete -  Palliative Care Screening Complete Not Applicable -  [place] Complete Complete -  Some recent data might be hidden

## 2020-01-19 NOTE — Progress Notes (Signed)
Renal Navigator left message for [facility] Admissions Coordinator to check on status of referral/acceptance for OP HD treatment. Navigator notes PMT follow up with family and appreciates ongoing care. Navigator will continue to follow closely.  Alphonzo Cruise, [place] Renal Navigator [phone]

## 2020-01-19 NOTE — Op Note (Addendum)
    Patient name: Dennis Macias MRN: [id number] DOB: [birth year] Sex: male  01/19/2020 Pre-operative Diagnosis: esrd Post-operative diagnosis:  Same Surgeon:  Erlene Quan C. Donzetta Matters, MD Assistant: Arlee Muslim, PA Procedure Performed:  Right arm brachial artery to cephalic vein AV fistula creation  Indications: 69 year old male with end-stage renal disease dialyzing via catheter.  He is right-hand dominant appears to have possible suitable cephalic vein on the right for fistula creation.  He is now indicated for right arm fistula versus graft.  Findings: Cephalic vein was 3 mm in diameter did have several areas where IVs had been placed but was easily flushed with heparinized saline.  The artery was heavily diseased although was externally 5 mm diameter.  At completion there was a very strong thrill throughout the cephalic vein fistula in the upper arm confirmed with Doppler.  There was a radial artery signal at the wrist that did not augment with compression of the fistula.  A PA was necessary for suction, retraction, suture anastomosis and suture closure of the wounds.    Procedure:  The patient was identified in the holding area and taken to the operating where he was placed supine on the operative table MAC anesthesia was induced.  He was sterilely prepped and draped in the right upper extremity usual fashion antibiotics were ministered a timeout was called.  We began using ultrasound we identified a suitable cephalic vein in the upper arm.  There was a very diminutive basilic vein.  A transverse incision was made between that and the palpable brachial artery pulse.  We dissected out the vein.  There were multiple small branches including a basilic vein that were ligated between clips and ties.  The fistula was marked for orientation.  We dissected through the deep fascia the brachial artery.  This was heavily diseased although large and pulsatile.  We placed a vessel loop around this.  The vein was then  clamped distally and transected and tied off.  He was spatulated flushed with heparinized saline and clamped.  The artery was clamped distally proximally opened longitudinally and flushed with heparinized saline distally only given the size.  We then sewed the vein end-to-side with 6-0 Prolene suture.  Prior to completion of flushing directions.  Upon completion we did free up some soft tissue around the vein there were multiple areas of hematoma where IVs had been placed.  We did have to place 2 clips on the vein where there were small pseudoaneurysms I elected not to suture these given the vein was unhealthy in those areas.  There was a strong thrill throughout the upper arm.  There was a palpable brachial artery pulse distal to the fistula but I cannot palpate a radial artery pulse but there was a good signal.  We obtain hemostasis we closed in layers of Vicryl Monocryl.  Dermabond is placed at the level of the skin.  He was awake from anesthesia having tolerated procedure without any complication.  All counts were correct at completion.    EBL: 20 cc     Koal Eslinger C. Donzetta Matters, MD [facility] [place] Office: [phone] Pager: [phone]

## 2020-01-19 NOTE — Progress Notes (Signed)
Subjective: Interviewed patient bedside. He reports no pain today. He reports no problems. He reports doing well overall. He has no questions at the present.  Objective:  Vital signs in last 24 hours: Vitals:   01/18/20 1528 01/18/20 2100 01/19/20 0354 01/19/20 0528  BP: 122/74 137/67 138/66 (!) 149/67  Pulse: 62 62 64   Resp: 16 16 16    Temp: 98.7 F (37.1 C) 98.5 F (36.9 C) 99.1 F (37.3 C)   TempSrc: Oral Oral Oral   SpO2: 99% 99% 97%   Weight:      Height:       Physical Exam Vitals and nursing note reviewed.  Constitutional:      General: He is not in acute distress.    Appearance: Normal appearance. He is normal weight. He is not ill-appearing or toxic-appearing.  HENT:     Head: Normocephalic and atraumatic.  Eyes:     Extraocular Movements: Extraocular movements intact.  Cardiovascular:     Rate and Rhythm: Normal rate and regular rhythm.     Pulses:          Radial pulses are 2+ on the right side and 2+ on the left side.       Dorsalis pedis pulses are 2+ on the right side and 2+ on the left side.     Heart sounds: Normal heart sounds, S1 normal and S2 normal. No murmur heard.   Pulmonary:     Effort: Pulmonary effort is normal. No respiratory distress.     Breath sounds: Normal breath sounds.  Abdominal:     General: Abdomen is flat. There is no distension.     Palpations: Abdomen is soft. There is no mass.     Tenderness: There is no abdominal tenderness.  Musculoskeletal:     Right lower leg: No edema.     Left lower leg: No edema.  Neurological:     General: No focal deficit present.     Mental Status: He is alert. Mental status is at baseline.      Assessment/Plan: [name] is a 69yr old male with PMHx of DM, HTN, HLD, CKD, CVA, CHF,with arecent hospitalization for HT emergency and HF exacerbation on 6/18.  Active Problems:   Acute on chronic anemia   Acute-on-chronic kidney injury ([place])   Hyperkalemia   CKD (chronic kidney disease),  stage V (H[facility])   Goals of care, counseling/discussion   ESRD (end stage renal disease) ([place])  Progressive CKD stage 5 to ESRD: Hyperkalemia: Had TDC placed on 7/8. First dialysis session 7/8, second 7/9. Nephrology assisting with management of starting dialysis. Vascular created AVG fistula today and patient tolerated the procedure well..  -Nephrology following, appreciate assistance -Vascular created AVG fistula today tolerated well -Monitor BMP -Renal navigator assisting with dialysis OP   GOC:  Given progression to ESRD and starting dialysis will need to have continued [place] discussion. Appears to have limited understanding of the lifelong need for dialysis. He expressed understanding that he will need an outpatient dialysis center. -Palliative care is following, appreciate assistance.    Anemia of chronic disease: Due to his ESRD. Had mild bleeding from his port post dialysis, heparin was held.Hgb has been stable, resumed his heparin. -Trend CBC -Continue SCDs   Hyperhidrosis: Will need outpatient follow up with dermatology.    HTN: -Continue hydralazine, coreg, and amlodipine.BP has improved and is stable   Chronic Combined Heart Failure:Euvolemic on exam.  -Continue to monitor  -Continue Coreg 25mg  BID  Prior to Admission Living Arrangement: Home Anticipated Discharge Location: Home Barriers to Discharge: establishing outpatient dialysis Dispo: Anticipated discharge in approximately1-2day(s).     Briant Cedar, MD 01/19/2020, 5:58 AM Pager: [phone] After 5pm on weekdays and 1pm on weekends: On Call pager [phone]

## 2020-01-19 NOTE — Progress Notes (Signed)
Patient ID: Dennis Macias, male   DOB: 1951-02-03, 69 y.o.   MRN: [id number] S:s/p R AVF today, no new issues just hungry  O:BP 131/63 (BP Location: Left Arm)   Pulse (!) 58   Temp 98.5 F (36.9 C) (Oral)   Resp 15   Ht 6' (1.829 m)   Wt 73.3 kg   SpO2 99%   BMI 21.92 kg/m   Intake/Output Summary (Last 24 hours) at 01/19/2020 1315 Last data filed at 01/19/2020 0925 Gross per 24 hour  Intake 880 ml  Output 1105 ml  Net -225 ml   Intake/Output: I/O last 3 completed shifts: In: 903 [P.O.:900; I.V.:3] Out: 1300 [Urine:1300]  Intake/Output this shift:  Total I/O In: 400 [I.V.:200; IV Piggyback:200] Out: 5 [Blood:5] Weight change:  Gen: NAD CVS: no rub Resp: cta Abd: benign Ext: no edema, RUE AC fossa incision c/d/i, 1+ radial pulse and hand warm  Recent Labs  Lab 01/12/20 2335 01/13/20 0107 01/13/20 0136 01/13/20 1010 01/14/20 0312 01/15/20 0744 01/17/20 1055 01/19/20 0526  NA 141  --  143 141 142 141 137 136  K 5.7*  --  5.7* 5.2* 5.2* 4.0 4.3 3.6  CL 112*  --   --  112* 112* 109 104 101  CO2 15*  --   --  14* 16* 21* 24 25  GLUCOSE 190*  --   --  108* 83 80 113* 109*  BUN 103*  --   --  100* 101* 60* 40* 26*  CREATININE 6.30*  --   --  6.24* 6.54* 4.56* 3.87* 3.34*  ALBUMIN  --  2.7*  --  2.6*  --  2.4* 2.1*  --   CALCIUM 8.4*  --   --  9.9 8.3* 7.9* 8.0* 8.2*  PHOS  --   --   --   --   --  6.1* 5.2*  --   AST  --  23  --  17  --   --   --   --   ALT  --  23  --  19  --   --   --   --    Liver Function Tests: Recent Labs  Lab 01/13/20 0107 01/13/20 0107 01/13/20 1010 01/15/20 0744 01/17/20 1055  AST 23  --  17  --   --   ALT 23  --  19  --   --   ALKPHOS 66  --  57  --   --   BILITOT 0.5  --  0.6  --   --   PROT 5.8*  --  5.5*  --   --   ALBUMIN 2.7*   < > 2.6* 2.4* 2.1*   < > = values in this interval not displayed.   No results for input(s): LIPASE, AMYLASE in the last 168 hours. No results for input(s): AMMONIA in the last 168 hours. CBC: Recent  Labs  Lab 01/13/20 1010 01/13/20 1622 01/14/20 0312 01/14/20 0312 01/15/20 0744 01/17/20 1055 01/19/20 0526  WBC 5.5  --  6.4   < > 7.0 5.6 5.1  HGB 7.1*   < > 8.2*   < > 8.5* 8.4* 8.6*  HCT 23.6*   < > 26.0*   < > 26.4* 26.5* 26.2*  MCV 96.3  --  93.2  --  91.3 91.7 90.7  PLT 139*  --  141*   < > 154 130* 135*   < > = values in this interval not displayed.  Cardiac Enzymes: No results for input(s): CKTOTAL, CKMB, CKMBINDEX, TROPONINI in the last 168 hours. CBG: Recent Labs  Lab 01/17/20 0805 01/18/20 0630 01/18/20 2038 01/19/20 0632 01/19/20 0937  GLUCAP 109* 89 154* 100* 128*    Iron Studies: No results for input(s): IRON, TIBC, TRANSFERRIN, FERRITIN in the last 72 hours. Studies/Results: No results found. Marland Kitchen amLODipine  10 mg Oral Daily  . aspirin EC  81 mg Oral Daily  . atorvastatin  80 mg Oral QHS  . carvedilol  25 mg Oral BID WC  . Chlorhexidine Gluconate Cloth  6 each Topical Q0600  . clopidogrel  75 mg Oral Daily  . dorzolamide-timolol  1 drop Right Eye BID  . heparin  5,000 Units Subcutaneous Q8H  . hydrALAZINE  50 mg Oral Q8H  . mupirocin ointment   Nasal BID  . sodium chloride flush  10-40 mL Intracatheter Q12H  . sodium chloride flush  3 mL Intravenous Q12H    BMET    Component Value Date/Time   NA 136 01/19/2020 0526   NA 137 12/10/2019 1439   K 3.6 01/19/2020 0526   CL 101 01/19/2020 0526   CO2 25 01/19/2020 0526   GLUCOSE 109 (H) 01/19/2020 0526   BUN 26 (H) 01/19/2020 0526   BUN 80 (HH) 12/10/2019 1439   CREATININE 3.34 (H) 01/19/2020 0526   CALCIUM 8.2 (L) 01/19/2020 0526   GFRNONAA 18 (L) 01/19/2020 0526   GFRAA 21 (L) 01/19/2020 0526   CBC    Component Value Date/Time   WBC 5.1 01/19/2020 0526   RBC 2.89 (L) 01/19/2020 0526   HGB 8.6 (L) 01/19/2020 0526   HGB 9.6 (L) 12/10/2019 1439   HCT 26.2 (L) 01/19/2020 0526   HCT 28.4 (L) 12/10/2019 1439   PLT 135 (L) 01/19/2020 0526   PLT 285 12/10/2019 1439   MCV 90.7 01/19/2020 0526    MCV 89 12/10/2019 1439   MCH 29.8 01/19/2020 0526   MCHC 32.8 01/19/2020 0526   RDW 14.6 01/19/2020 0526   RDW 14.5 12/10/2019 1439   LYMPHSABS 1.1 12/24/2019 0852   LYMPHSABS 2.0 02/24/2018 1142   MONOABS 0.4 12/24/2019 0852   EOSABS 0.0 12/24/2019 0852   EOSABS 0.1 02/24/2018 1142   BASOSABS 0.0 12/24/2019 0852   BASOSABS 0.0 02/24/2018 1142    Assessment/Plan: 1. Progressive CKD stage V, now ESRD- underlying CKD is due to DM and HTN and has had a slowly progressive decline in his renal function over the past 7 months. He has failed outpatient diuretics with multiple hospitalizations for acute on chronic CHF. Options were discussed with him and he wished to proceed with HD. He is a marginal long term dialysis candidate given his multiple co-morbidities, especially his ischemic CMP as well as poor functional and nutritional status. 1. S/p RIJTDC placement7/8/21 by IR  2. First session of HD 01/14/20 and second on 01/15/20, 3rd was 7/11 3. Plan for next HD today but depending on outpt schedule will adapt to that prior to discharge 4. Await outpatient schedule for HD, likely [place] 5. S/p AVF 7/13 - appreciate VVS assistance 2. Acute on chronic anemia of CKD stage V- s/p blood transfusion and will need to start ESA. Follow H/H and transfuse prn.   1. Given sq dose 01/16/20 since we don't know his schedule for outpatient HD yet. 3. hyperhidrosis- has been seen as an outpatient for this and is longstanding. 4. Acute on chronic combined systolic and diastolic CHF- improved with IV lasix and now  HD 5. HTN- stable 6. Adult failure to thrive- pt with multiple admissions in last 7 months and decline in his functional and nutritional status. He is a marginal dialysis candidate and recommend Palliative care consult to help set goals/limits of care 7. Ischemic CMP- no chest pain.  [name] [facility] Pager [phone]

## 2020-01-19 NOTE — Discharge Summary (Signed)
Name: Dennis Macias MRN: [id number] DOB: March 02, 1951 69 y.o. PCP: [name], MD  Date of Admission: 01/12/2020 11:16 PM Date of Discharge: 01/21/2020 Attending Physician: Lucious Groves, DO  Discharge Diagnosis: 1. Progression of CKD stage 5 to ESRD Anemia of chronic disease   Discharge Medications: Allergies as of 01/21/2020      Reactions   Cefepime Other (See Comments)   Pt had BAD encephalopathy from Cefepime      Medication List    STOP taking these medications   melatonin 3 MG Tabs tablet     TAKE these medications   Accu-Chek FastClix Lancets Misc Check blood sugar up to 7 times a week as instructed   Accu-Chek Guide test strip Generic drug: glucose blood Check blood sugar up to 7 times a week as instructed   Accu-Chek Guide w/Device Kit 1 each by Does not apply route daily. Check blood sugar as instructed up to 7 times a week   acetaminophen 325 MG tablet Commonly known as: TYLENOL Take 1-2 tablets (325-650 mg total) by mouth every 4 (four) hours as needed for mild pain.   amLODipine 10 MG tablet Commonly known as: NORVASC Take 1 tablet (10 mg total) by mouth daily. What changed: Another medication with the same name was removed. Continue taking this medication, and follow the directions you see here.   aspirin EC 81 MG tablet Take 1 tablet (81 mg total) by mouth daily.   atorvastatin 80 MG tablet Commonly known as: LIPITOR Take 1 tablet (80 mg total) by mouth at bedtime. IM program   carvedilol 25 MG tablet Commonly known as: COREG Take 1 tablet (25 mg total) by mouth 2 (two) times daily with a meal.   ciprofloxacin 250 MG tablet Commonly known as: CIPRO Take 2 tablets (500 mg total) by mouth daily with breakfast.   clopidogrel 75 MG tablet Commonly known as: PLAVIX Take 1 tablet (75 mg total) by mouth daily.   dorzolamide-timolol 22.3-6.8 MG/ML ophthalmic solution Commonly known as: COSOPT Place 1 drop into the right eye 2 (two) times  daily.   finasteride 5 MG tablet Commonly known as: PROSCAR Take 1 tablet (5 mg total) by mouth daily.   furosemide 80 MG tablet Commonly known as: LASIX Take 1 tablet (80 mg total) by mouth 2 (two) times daily.   hydrALAZINE 50 MG tablet Commonly known as: APRESOLINE Take 1 tablet (50 mg total) by mouth every 8 (eight) hours. What changed: Another medication with the same name was removed. Continue taking this medication, and follow the directions you see here.   isosorbide mononitrate 60 MG 24 hr tablet Commonly known as: IMDUR Take 1 tablet (60 mg total) by mouth daily.   tamsulosin 0.4 MG Caps capsule Commonly known as: FLOMAX Take 1 capsule (0.4 mg total) by mouth daily after supper.   vitamin B-12 1000 MCG tablet Commonly known as: CYANOCOBALAMIN Take 1 tablet (1,000 mcg total) by mouth daily.       Disposition and follow-up:   Dennis Macias was discharged from [facility] in Stable condition.  At the hospital follow up visit please address:  1.  Progression of CKD stage 5 to ESRD: Presented to ED with shortness of breath and some volume overload. With history of multiple hospitalizations each month for several months Nephrology recommended starting dialysis. Patient was agreeable to start dialysis. Had a temporary port placed on 7/8 and that night had his first dialysis session. His second was done on 7/9,  third on 7/11, fourth on 7/14, and fifth on 7/15. He is scheduled for his first outpatient session on Saturday 7/17. He had an AVF placed in his Right forearm on 7/13 and tolerated that well. Please ensure you make every dialysis session as it is very important to your health.  Anemia of chronic disease: Presented to the ED with a Hemoglobin of 6.8. He was transfused with one unit and on discharge had a hem of 8.0. He only had one incident of bleeding after his first dialysis session he had some oozing from his port site but that stopped within a few hours  with dressing applied.   GOC: Due to the progression of his CKD stage V to ESRD palliative care was consulted to help him and his family understand all the changes that would come with beginning dialysis. Patient and family had multiple discussion so that all concerns were addressed. Advanced directive paperwork was given to patient and filled out.Marland Kitchen Please utilize outpatient Palliative Support care as needed.  2.  Labs / imaging needed at time of follow-up: none  3.  Pending labs/ test needing follow-up: none  Follow-up Appointments:  Follow-up Information    Darrick Meigs, [name], MD. Schedule an appointment as soon as possible for a visit in 1 week(s).   Specialty: Internal Medicine Contact information: [address] [place] [address] [phone]        [facility] Follow up in 5 week(s).   Specialty: Vascular Surgery Why: sent  Contact information: [address] [place], [facility] Follow up.   Specialty: Home Health Services Why: [name] will restart PT, RN therapy within 24-48 hours of your discharge home. Contact information: [address] [place] [address] [phone]        [facility] Follow up.   Why: Palliative care support will be contacting regarding support at home through a nurse in addition to home health. Contact information: [place] [facility] Course by problem list: 1.  Progression of CKD stage 5 to ESRD: Presented to ED with shortness of breath and some volume overload. With history of multiple hospitalizations each month for several months Nephrology recommended starting dialysis. Patient was agreeable to start dialysis. Had a temporary port placed on 7/8 and that night had his first dialysis session. His second was done on 7/9, third on 7/11,  fourth on 7/14, and fifth on 7/15. He is scheduled for his first outpatient session on Saturday 7/17. He had an AVF placed in his Right forearm on 7/13 and tolerated that well.   Anemia of chronic disease: Presented to the ED with a Hemoglobin of 6.8. He was transfused with one unit and on discharge had a hem of 8.0. He only had one incident of bleeding after his first dialysis session he had some oozing from his port site but that stopped within a few hours with dressing applied.   GOC: Due to the progression of his CKD stage V to ESRD palliative care was consulted to help him and his family understand all the changes that would come with beginning dialysis. Patient and family had multiple discussion so that all concerns were addressed. Advanced directive paperwork was given to patient and filled out.   Discharge Vitals:   BP (P) 130/63   Pulse (!) (  P) 58   Temp 99.4 F (37.4 C) (Oral)   Resp 16   Ht 6' (1.829 m)   Wt 71.9 kg   SpO2 99%   BMI 21.50 kg/m   Pertinent Labs, Studies, and Procedures:  CBC Latest Ref Rng & Units 01/21/2020 01/20/2020 01/19/2020  WBC 4.0 - 10.5 K/uL 5.4 5.9 5.1  Hemoglobin 13.0 - 17.0 g/dL 8.0(L) 8.2(L) 8.6(L)  Hematocrit 39 - 52 % 25.1(L) 25.6(L) 26.2(L)  Platelets 150 - 400 K/uL 181 146(L) 135(L)   BMP Latest Ref Rng & Units 01/21/2020 01/20/2020 01/19/2020  Glucose 70 - 99 mg/dL 169(H) 109(H) 109(H)  BUN 8 - 23 mg/dL 21 33(H) 26(H)  Creatinine 0.61 - 1.24 mg/dL 2.89(H) 4.02(H) 3.34(H)  BUN/Creat Ratio 10 - 24 - - -  Sodium 135 - 145 mmol/L 136 136 136  Potassium 3.5 - 5.1 mmol/L 3.2(L) 3.6 3.6  Chloride 98 - 111 mmol/L 101 100 101  CO2 22 - 32 mmol/L 26 23 25   Calcium 8.9 - 10.3 mg/dL 7.7(L) 8.0(L) 8.2(L)   Procedures: Temporary dialysis port placed, AVG fistual created in right forearm  Discharge Instructions: Discharge Instructions    (HEART FAILURE PATIENTS) Call MD:  Anytime you have any of the following symptoms: 1) 3 pound weight gain in 24  hours or 5 pounds in 1 week 2) shortness of breath, with or without a dry hacking cough 3) swelling in the hands, feet or stomach 4) if you have to sleep on extra pillows at night in order to breathe.   Complete by: As directed    Call MD for:   Complete by: As directed    Call MD for:  difficulty breathing, headache or visual disturbances   Complete by: As directed    Call MD for:  extreme fatigue   Complete by: As directed    Call MD for:  hives   Complete by: As directed    Call MD for:  persistant dizziness or light-headedness   Complete by: As directed    Call MD for:  persistant nausea and vomiting   Complete by: As directed    Call MD for:  redness, tenderness, or signs of infection (pain, swelling, redness, odor or green/yellow discharge around incision site)   Complete by: As directed    Call MD for:  severe uncontrolled pain   Complete by: As directed    Call MD for:  temperature >100.4   Complete by: As directed    Diet - low sodium heart healthy   Complete by: As directed    Discharge instructions   Complete by: As directed    Mr. Crom it was a pleasure to take care of you. Now that you have progressed to End Stage Renal Disease and require dialysis it is very important that you make each and everyone of your appointments. Your schedule for dialysis has been established by the dialysis coordinator. Please see you Primary Care Provider within the next week to ensure they are updated on everything that has changed with your health. Please make sure to keep your outpatient dermatology appointment if your issues with sweating continue to bother you.  Thank you   Increase activity slowly   Complete by: As directed    No wound care   Complete by: As directed       Signed: Briant Cedar, MD 01/21/2020, 1:50 PM   Pager: [phone]

## 2020-01-19 NOTE — Progress Notes (Signed)
  Progress Note    01/19/2020 8:20 AM Day of Surgery  Subjective: No overnight issues  Vitals:   01/19/20 0528 01/19/20 0748  BP: (!) 149/67 (!) 154/74  Pulse:  67  Resp:  15  Temp:  98.5 F (36.9 C)  SpO2:  97%    Physical Exam: Awake alert oriented Nonlabored respirations Right brachial pulses palpable  CBC    Component Value Date/Time   WBC 5.1 01/19/2020 0526   RBC 2.89 (L) 01/19/2020 0526   HGB 8.6 (L) 01/19/2020 0526   HGB 9.6 (L) 12/10/2019 1439   HCT 26.2 (L) 01/19/2020 0526   HCT 28.4 (L) 12/10/2019 1439   PLT 135 (L) 01/19/2020 0526   PLT 285 12/10/2019 1439   MCV 90.7 01/19/2020 0526   MCV 89 12/10/2019 1439   MCH 29.8 01/19/2020 0526   MCHC 32.8 01/19/2020 0526   RDW 14.6 01/19/2020 0526   RDW 14.5 12/10/2019 1439   LYMPHSABS 1.1 12/24/2019 0852   LYMPHSABS 2.0 02/24/2018 1142   MONOABS 0.4 12/24/2019 0852   EOSABS 0.0 12/24/2019 0852   EOSABS 0.1 02/24/2018 1142   BASOSABS 0.0 12/24/2019 0852   BASOSABS 0.0 02/24/2018 1142    BMET    Component Value Date/Time   NA 136 01/19/2020 0526   NA 137 12/10/2019 1439   K 3.6 01/19/2020 0526   CL 101 01/19/2020 0526   CO2 25 01/19/2020 0526   GLUCOSE 109 (H) 01/19/2020 0526   BUN 26 (H) 01/19/2020 0526   BUN 80 (HH) 12/10/2019 1439   CREATININE 3.34 (H) 01/19/2020 0526   CALCIUM 8.2 (L) 01/19/2020 0526   GFRNONAA 18 (L) 01/19/2020 0526   GFRAA 21 (L) 01/19/2020 0526    INR    Component Value Date/Time   INR 1.9 (H) 01/14/2020 0312     Intake/Output Summary (Last 24 hours) at 01/19/2020 0820 Last data filed at 01/19/2020 0403 Gross per 24 hour  Intake 480 ml  Output 1100 ml  Net -620 ml     Assessment/plan:  69 y.o. male is here with end-stage renal disease dialyzing via catheter.  He appears to have cephalic vein on the right that is suitable for fistula creation.  Plan for cephalic vein fistula versus graft today in the OR.    Avaiyah Strubel C. Donzetta Matters, MD [facility] [place] Office: [phone] Pager: [phone]  01/19/2020 8:20 AM

## 2020-01-20 ENCOUNTER — Encounter (HOSPITAL_COMMUNITY): Payer: Self-pay | Admitting: Vascular Surgery

## 2020-01-20 ENCOUNTER — Encounter (HOSPITAL_COMMUNITY): Payer: Medicare PPO

## 2020-01-20 LAB — CBC
HCT: 25.6 % — ABNORMAL LOW (ref 39.0–52.0)
Hemoglobin: 8.2 g/dL — ABNORMAL LOW (ref 13.0–17.0)
MCH: 29 pg (ref 26.0–34.0)
MCHC: 32 g/dL (ref 30.0–36.0)
MCV: 90.5 fL (ref 80.0–100.0)
Platelets: 146 10*3/uL — ABNORMAL LOW (ref 150–400)
RBC: 2.83 MIL/uL — ABNORMAL LOW (ref 4.22–5.81)
RDW: 14.6 % (ref 11.5–15.5)
WBC: 5.9 10*3/uL (ref 4.0–10.5)
nRBC: 0 % (ref 0.0–0.2)

## 2020-01-20 LAB — RENAL FUNCTION PANEL
Albumin: 1.9 g/dL — ABNORMAL LOW (ref 3.5–5.0)
Anion gap: 13 (ref 5–15)
BUN: 33 mg/dL — ABNORMAL HIGH (ref 8–23)
CO2: 23 mmol/L (ref 22–32)
Calcium: 8 mg/dL — ABNORMAL LOW (ref 8.9–10.3)
Chloride: 100 mmol/L (ref 98–111)
Creatinine, Ser: 4.02 mg/dL — ABNORMAL HIGH (ref 0.61–1.24)
GFR calc Af Amer: 17 mL/min — ABNORMAL LOW (ref >60)
GFR calc non Af Amer: 14 mL/min — ABNORMAL LOW (ref >60)
Glucose, Bld: 109 mg/dL — ABNORMAL HIGH (ref 70–99)
Phosphorus: 4.8 mg/dL — ABNORMAL HIGH (ref 2.5–4.6)
Potassium: 3.6 mmol/L (ref 3.5–5.1)
Sodium: 136 mmol/L (ref 135–145)

## 2020-01-20 LAB — GLUCOSE, CAPILLARY: Glucose-Capillary: 107 mg/dL — ABNORMAL HIGH (ref 70–99)

## 2020-01-20 MED ORDER — HEPARIN SODIUM (PORCINE) 1000 UNIT/ML IJ SOLN
INTRAMUSCULAR | Status: AC
  Administered 2020-01-20: 3800 [IU]
  Filled 2020-01-20: qty 4

## 2020-01-20 NOTE — Progress Notes (Addendum)
Renal Navigator met with patient and family (fignificant other, son and daughter) at bedside to discuss OP HD schedule and transportation with [facility] (attempted to complete paperwork yesterday, however, patient was in OR). It has been determined that patient will have one more HD treatment in the hospital tomorrow (there is no family available to transport him and [facility] will not be arranged in time for OP HD tomorrow). In addition, the family appears overwhelmed and state that they are feeling stressed/rushed with planning. Navigator acknowledges that things are hectic now, but that once he starts in his clinic and transportation is arranged, hopefully things will feel more settled. Patient should be ready for discharge tomorrow, per Primary MD and in this case, he will start in the OP HD clinic on Saturday, 01/23/20, with 11:40am arrival time for 12:00pm seat-clinic updated. Since he will be starting in the OP HD clinic on a Saturday, patient needs to complete intake paperwork at the clinic on Friday, 7/16. Navigator confirmed that his significant other will be able to take him to the clinic to sign paperwork after work on Friday. She gets off at 2:00pm. Navigator instructed them to go before 4pm and educated on the importance of this. Family agrees.  Patient and family state appreciation for support and discharge planning. Nephrologist, Primary and CM updated.  ,  Elizabeth, LCSW Renal Navigator [phone] 

## 2020-01-20 NOTE — Progress Notes (Addendum)
  Progress Note    01/20/2020 7:32 AM 1 Day Post-Op  Subjective: no complaints this morning. Sitting up in bed eating breakfast   Vitals:   01/20/20 0311 01/20/20 0728  BP: 139/61 (!) 141/65  Pulse: (!) 58 60  Resp: 17 16  Temp: (!) 97.4 F (36.3 C) 98.7 F (37.1 C)  SpO2: 97% 99%   Physical Exam: Cardiac:  regular Lungs: non labored Incisions: right AC incision c/d/i without swelling or hematoma. No ecchymosis or erythema. Palpable thrill in av fistula Extremities:  2+ radial and brachial pulse in right arm, no arm swelling. 5/5 grip strength. Right hand warm with motor and sensation intact Neurologic: alert and oriented  CBC    Component Value Date/Time   WBC 5.1 01/19/2020 0526   RBC 2.89 (L) 01/19/2020 0526   HGB 8.6 (L) 01/19/2020 0526   HGB 9.6 (L) 12/10/2019 1439   HCT 26.2 (L) 01/19/2020 0526   HCT 28.4 (L) 12/10/2019 1439   PLT 135 (L) 01/19/2020 0526   PLT 285 12/10/2019 1439   MCV 90.7 01/19/2020 0526   MCV 89 12/10/2019 1439   MCH 29.8 01/19/2020 0526   MCHC 32.8 01/19/2020 0526   RDW 14.6 01/19/2020 0526   RDW 14.5 12/10/2019 1439   LYMPHSABS 1.1 12/24/2019 0852   LYMPHSABS 2.0 02/24/2018 1142   MONOABS 0.4 12/24/2019 0852   EOSABS 0.0 12/24/2019 0852   EOSABS 0.1 02/24/2018 1142   BASOSABS 0.0 12/24/2019 0852   BASOSABS 0.0 02/24/2018 1142    BMET    Component Value Date/Time   NA 136 01/19/2020 0526   NA 137 12/10/2019 1439   K 3.6 01/19/2020 0526   CL 101 01/19/2020 0526   CO2 25 01/19/2020 0526   GLUCOSE 109 (H) 01/19/2020 0526   BUN 26 (H) 01/19/2020 0526   BUN 80 (HH) 12/10/2019 1439   CREATININE 3.34 (H) 01/19/2020 0526   CALCIUM 8.2 (L) 01/19/2020 0526   GFRNONAA 18 (L) 01/19/2020 0526   GFRAA 21 (L) 01/19/2020 0526    INR    Component Value Date/Time   INR 1.9 (H) 01/14/2020 0312     Intake/Output Summary (Last 24 hours) at 01/20/2020 0732 Last data filed at 01/20/2020 0700 Gross per 24 hour  Intake 640 ml  Output  605 ml  Net 35 ml     Assessment/Plan:  69 y.o. male is s/p right radiocephalic AV fistula 1 Day Post-Op. Doing well post op. Very minimal surgical site pain. No pain in right forearm or hand. No steal symptoms. Palpable thrill in fistula. Incision is intact and without hematoma. He will follow up in our office in 4-6 weeks with fistula duplex  Karoline Caldwell, PA-C [facility] [phone] 01/20/2020 7:32 AM   I have independently interviewed and examined patient and agree with PA assessment and plan above.   Sae Handrich C. Donzetta Matters, MD [facility] [place] Office: [phone] Pager: [phone]

## 2020-01-20 NOTE — Progress Notes (Signed)
Patient has been approved for OP HD at [facility] clinic on a TTS schedule with a seat time of 12:00pm. He needs to arrive to his appointments at 11:40am.  If he is ready for discharge later today, he will need to arrive at 11:00am tomorrow to complete intake paperwork prior to his first treatment. If Saturday will be his first treatment, he needs to go to the clinic on Friday to sign papers. Navigator needs to meet with patient to complete [facility] application and will discuss discharge plan with primary to make discharge plan. Navigator to continue to follow closely.  Alphonzo Cruise, [place] Renal Navigator [phone]

## 2020-01-20 NOTE — Anesthesia Preprocedure Evaluation (Signed)
Anesthesia Evaluation  Patient identified by MRN, date of birth, ID band Patient awake    Reviewed: Allergy & Precautions, NPO status , Patient's Chart, lab work & pertinent test results  History of Anesthesia Complications Negative for: history of anesthetic complications  Airway Mallampati: II  TM Distance: >3 FB Neck ROM: Full    Dental  (+) Edentulous Upper, Edentulous Lower, Dental Advisory Given   Pulmonary shortness of breath, neg recent URI, former smoker,    breath sounds clear to auscultation       Cardiovascular hypertension, Pt. on medications (-) angina+ CAD, + Past MI and +CHF   Rhythm:Regular Rate:Normal  HLD  Stress Test 10/17/19 1. Findings most consistent with LEFT ventricular apex infarction with peri-infarct ischemia most severe in the anterior wall but also involving the inferior wall. Overall large defect with moderate decreased perfusion. 2. Global hypokinesia.  Transient LEFT ventricular dilatation. 3. Left ventricular ejection fraction 40% 4. Non invasive risk stratification*: High Per Cardiology, defer LHC at this time 2/2 risks of further kidney injury outweigh benefits   TTE 10/15/19 EF 40-45%, regional wall motion abnormalities present, G1DD, RV function normal, LA severely dilated, moderate pleural effusions, mild MR   Neuro/Psych CVA (2017) negative psych ROS   GI/Hepatic negative GI ROS, Neg liver ROS,   Endo/Other  negative endocrine ROSdiabetes  Renal/GU Renal InsufficiencyRenal disease (Cr 3.52, K 4.0)     Musculoskeletal  (+) Arthritis ,   Abdominal   Peds  Hematology  (+) Blood dyscrasia (Hgb 8.3), anemia ,   Anesthesia Other Findings   Reproductive/Obstetrics                             Anesthesia Physical Anesthesia Plan  ASA: IV  Anesthesia Plan: MAC   Post-op Pain Management:    Induction: Intravenous  PONV Risk Score and Plan: 1 and  Treatment may vary due to age or medical condition and Propofol infusion  Airway Management Planned: Nasal Cannula  Additional Equipment: None  Intra-op Plan:   Post-operative Plan:   Informed Consent: I have reviewed the patients History and Physical, chart, labs and discussed the procedure including the risks, benefits and alternatives for the proposed anesthesia with the patient or authorized representative who has indicated his/her understanding and acceptance.     Dental advisory given  Plan Discussed with: CRNA and Surgeon  Anesthesia Plan Comments:         Anesthesia Quick Evaluation

## 2020-01-20 NOTE — Plan of Care (Signed)

## 2020-01-20 NOTE — Evaluation (Signed)
Physical Therapy Evaluation Patient Details Name: Dennis Macias MRN: [id number] DOB: [birth year] Today's Date: 01/20/2020   History of Present Illness  Pt is a 69 y.o. M with significant PMH of DM, HTN, CKD, CVA, CHF who presents with progressive CKD stage 5. Had [facility] placed and started dialysis on 01/14/2020.  Clinical Impression  Prior to admission, pt lives with his girlfriend in an apartment, uses a quad cane for limited ambulation and is independent with ADL's. On PT evaluation, pt presents with deconditioning, generalized weakness, decreased activity tolerance, and balance deficits. Requiring min assist to transition to standing; after taking 2 steps forward, pt stating, 'I can't do this," and sat back down on the bed. Decreased response time to therapist, but ultimately denied pain and/or fatigue. Would not elaborate further on why he could not walk. Discussed importance of mobility and assessing ability to ambulate household distances. Instructed that otherwise, he will need to use his wheelchair.     Follow Up Recommendations Home health PT;Supervision/Assistance - 24 hour (declining SNF)    Equipment Recommendations  None recommended by PT (has all DME)   Recommendations for Other Services       Precautions / Restrictions Precautions Precautions: Fall Restrictions Weight Bearing Restrictions: No      Mobility  Bed Mobility Overal bed mobility: Needs Assistance Bed Mobility: Supine to Sit;Sit to Supine     Supine to sit: Min guard Sit to supine: Min guard      Transfers Overall transfer level: Needs assistance Equipment used: Quad cane Transfers: Sit to/from Stand Sit to Stand: Min assist         General transfer comment: MinA to boost up from elevated bed height  Ambulation/Gait Ambulation/Gait assistance: Min guard Gait Distance (Feet): 2 Feet Assistive device: Quad cane       General Gait Details: Pt taking 2 steps forward and back with min guard assist for  stability  Stairs            Wheelchair Mobility    Modified Rankin (Stroke Patients Only)       Balance Overall balance assessment: Needs assistance Sitting-balance support: No upper extremity supported;Feet supported Sitting balance-Leahy Scale: Good     Standing balance support: During functional activity;Single extremity supported Standing balance-Leahy Scale: Poor Standing balance comment: relies on UE support for balance                              Pertinent Vitals/Pain Pain Assessment: No/denies pain    Home Living Family/patient expects to be discharged to:: Private residence Living Arrangements: Spouse/significant other (girlfriend) Available Help at Discharge: Family;Available 24 hours/day Type of Home: Apartment Home Access: Stairs to enter Entrance Stairs-Rails: Right Entrance Stairs-Number of Steps: 6 Home Layout: One level Home Equipment: Bedside commode;Wheelchair - manual;Cane - quad;Walker - 2 wheels;Walker - 4 wheels;Tub bench      Prior Function Level of Independence: Needs assistance   Gait / Transfers Assistance Needed: usually ambulates with walker or cane, limited community distances  ADL's / Homemaking Assistance Needed: independent with dressing, and sponge baths, girlfriend does cooking and cleaning        Hand Dominance   Dominant Hand: Right    Extremity/Trunk Assessment   Upper Extremity Assessment Upper Extremity Assessment: Generalized weakness    Lower Extremity Assessment Lower Extremity Assessment: Generalized weakness       Communication   Communication: HOH  Cognition Arousal/Alertness: Awake/alert Behavior During Therapy: Flat affect  Overall Cognitive Status: History of cognitive impairments - at baseline Area of Impairment: Awareness;Safety/judgement                         Safety/Judgement: Decreased awareness of deficits Awareness: Intellectual   General Comments: Pt follows 1  step commands, decreased awareness of deficits      General Comments      Exercises     Assessment/Plan    PT Assessment Patient needs continued PT services  PT Problem List Decreased activity tolerance;Decreased balance;Decreased mobility;Decreased knowledge of use of DME;Decreased safety awareness;Decreased knowledge of precautions       PT Treatment Interventions DME instruction;Gait training;Functional mobility training;Therapeutic activities;Therapeutic exercise;Balance training;Patient/family education    PT Goals (Current goals can be found in the Care Plan section)  Acute Rehab PT Goals Patient Stated Goal: to go home PT Goal Formulation: With patient Time For Goal Achievement: 02/03/20 Potential to Achieve Goals: Fair    Frequency Min 3X/week   Barriers to discharge        Co-evaluation               AM-PAC PT "6 Clicks" Mobility  Outcome Measure Help needed turning from your back to your side while in a flat bed without using bedrails?: None Help needed moving from lying on your back to sitting on the side of a flat bed without using bedrails?: A Little Help needed moving to and from a bed to a chair (including a wheelchair)?: A Little Help needed standing up from a chair using your arms (e.g., wheelchair or bedside chair)?: A Little Help needed to walk in hospital room?: A Little Help needed climbing 3-5 steps with a railing? : A Lot 6 Click Score: 18    End of Session Equipment Utilized During Treatment: Gait belt Activity Tolerance: Patient limited by fatigue Patient left: in bed;with call bell/phone within reach;with bed alarm set   PT Visit Diagnosis: Muscle weakness (generalized) (M62.81)    Time: 4503-8882 PT Time Calculation (min) (ACUTE ONLY): 17 min   Charges:   PT Evaluation $PT Eval Moderate Complexity: 1 Mod            Dennis Macias, PT, DPT Acute Rehabilitation Services Pager [phone] Office  [phone]   Dennis Macias 01/20/2020, 5:00 PM

## 2020-01-20 NOTE — Plan of Care (Signed)
  Problem: Education: Goal: Knowledge of General Education information will improve Description: Including pain rating scale, medication(s)/side effects and non-pharmacologic comfort measures Outcome: Progressing   Problem: Activity: Goal: Risk for activity intolerance will decrease Outcome: Progressing   Problem: Pain Managment: Goal: General experience of comfort will improve Outcome: Progressing   Problem: Safety: Goal: Ability to remain free from injury will improve Outcome: Progressing   Problem: Education: Goal: Knowledge of General Education information will improve Description: Including pain rating scale, medication(s)/side effects and non-pharmacologic comfort measures Outcome: Progressing

## 2020-01-20 NOTE — Progress Notes (Signed)
Patient ID: Dennis Macias, male   DOB: 11-24-1950, 69 y.o.   MRN: [id number] S:seen at HD, no new issues, feeling fine  O:BP 126/64 (BP Location: Left Arm)   Pulse (!) 57   Temp 99.6 F (37.6 C) (Oral)   Resp 19   Ht 6' (1.829 m)   Wt 72.5 kg Comment: bed  SpO2 96%   BMI 21.68 kg/m   Intake/Output Summary (Last 24 hours) at 01/20/2020 1021 Last data filed at 01/20/2020 0700 Gross per 24 hour  Intake 240 ml  Output 600 ml  Net -360 ml   Intake/Output: I/O last 3 completed shifts: In: 640 [P.O.:240; I.V.:200; IV Piggyback:200] Out: 1205 [Urine:1200; Blood:5]  Intake/Output this shift:  No intake/output data recorded. Weight change:  Gen: NAD CVS: no rub Resp: cta Abd: benign Ext: no edema, RUE AC fossa incision c/d/i, 1+ radial pulse and hand warm  Recent Labs  Lab 01/14/20 0312 01/15/20 0744 01/17/20 1055 01/19/20 0526 01/20/20 0813  NA 142 141 137 136 136  K 5.2* 4.0 4.3 3.6 3.6  CL 112* 109 104 101 100  CO2 16* 21* 24 25 23   GLUCOSE 83 80 113* 109* 109*  BUN 101* 60* 40* 26* 33*  CREATININE 6.54* 4.56* 3.87* 3.34* 4.02*  ALBUMIN  --  2.4* 2.1*  --  1.9*  CALCIUM 8.3* 7.9* 8.0* 8.2* 8.0*  PHOS  --  6.1* 5.2*  --  4.8*   Liver Function Tests: Recent Labs  Lab 01/15/20 0744 01/17/20 1055 01/20/20 0813  ALBUMIN 2.4* 2.1* 1.9*   No results for input(s): LIPASE, AMYLASE in the last 168 hours. No results for input(s): AMMONIA in the last 168 hours. CBC: Recent Labs  Lab 01/14/20 0312 01/14/20 0312 01/15/20 0744 01/15/20 0744 01/17/20 1055 01/19/20 0526 01/20/20 0813  WBC 6.4   < > 7.0   < > 5.6 5.1 5.9  HGB 8.2*   < > 8.5*   < > 8.4* 8.6* 8.2*  HCT 26.0*   < > 26.4*   < > 26.5* 26.2* 25.6*  MCV 93.2  --  91.3  --  91.7 90.7 90.5  PLT 141*   < > 154   < > 130* 135* 146*   < > = values in this interval not displayed.   Cardiac Enzymes: No results for input(s): CKTOTAL, CKMB, CKMBINDEX, TROPONINI in the last 168 hours. CBG: Recent Labs  Lab  01/18/20 0630 01/18/20 2038 01/19/20 0632 01/19/20 0937 01/20/20 0649  GLUCAP 89 154* 100* 128* 107*    Iron Studies: No results for input(s): IRON, TIBC, TRANSFERRIN, FERRITIN in the last 72 hours. Studies/Results: No results found. Marland Kitchen amLODipine  10 mg Oral Daily  . aspirin EC  81 mg Oral Daily  . atorvastatin  80 mg Oral QHS  . carvedilol  25 mg Oral BID WC  . Chlorhexidine Gluconate Cloth  6 each Topical Q0600  . clopidogrel  75 mg Oral Daily  . dorzolamide-timolol  1 drop Right Eye BID  . heparin  5,000 Units Subcutaneous Q8H  . hydrALAZINE  50 mg Oral Q8H  . mupirocin ointment   Nasal BID  . sodium chloride flush  10-40 mL Intracatheter Q12H  . sodium chloride flush  3 mL Intravenous Q12H    BMET    Component Value Date/Time   NA 136 01/20/2020 0813   NA 137 12/10/2019 1439   K 3.6 01/20/2020 0813   CL 100 01/20/2020 0813   CO2 23 01/20/2020 0813  GLUCOSE 109 (H) 01/20/2020 0813   BUN 33 (H) 01/20/2020 0813   BUN 80 (HH) 12/10/2019 1439   CREATININE 4.02 (H) 01/20/2020 0813   CALCIUM 8.0 (L) 01/20/2020 0813   GFRNONAA 14 (L) 01/20/2020 0813   GFRAA 17 (L) 01/20/2020 0813   CBC    Component Value Date/Time   WBC 5.9 01/20/2020 0813   RBC 2.83 (L) 01/20/2020 0813   HGB 8.2 (L) 01/20/2020 0813   HGB 9.6 (L) 12/10/2019 1439   HCT 25.6 (L) 01/20/2020 0813   HCT 28.4 (L) 12/10/2019 1439   PLT 146 (L) 01/20/2020 0813   PLT 285 12/10/2019 1439   MCV 90.5 01/20/2020 0813   MCV 89 12/10/2019 1439   MCH 29.0 01/20/2020 0813   MCHC 32.0 01/20/2020 0813   RDW 14.6 01/20/2020 0813   RDW 14.5 12/10/2019 1439   LYMPHSABS 1.1 12/24/2019 0852   LYMPHSABS 2.0 02/24/2018 1142   MONOABS 0.4 12/24/2019 0852   EOSABS 0.0 12/24/2019 0852   EOSABS 0.1 02/24/2018 1142   BASOSABS 0.0 12/24/2019 0852   BASOSABS 0.0 02/24/2018 1142    Assessment/Plan: 1. Progressive CKD stage V, now ESRD- underlying CKD is due to DM and HTN and has had a slowly progressive decline in  his renal function over the past 7 months. He has failed outpatient diuretics with multiple hospitalizations for acute on chronic CHF. Options were discussed with him and he wished to proceed with HD. He is a marginal long term dialysis candidate given his multiple co-morbidities, especially his ischemic CMP as well as poor functional and nutritional status. 1. S/p RIJTDC placement7/8/21 by IR  2. First session of HD 01/14/20 and second on 01/15/20, 3rd was 7/11 3. HD today 4. Outpt HD will be TTS at [facility] 5. S/p AVF 7/13 - appreciate [facility] assistance 2. Acute on chronic anemia of CKD stage V- s/p blood transfusion and will need to start ESA. Follow H/H and transfuse prn.   1. Given sq dose 01/16/20 since we don't know his schedule for outpatient HD yet.  Next dose outpt 3. hyperhidrosis- has been seen as an outpatient for this and is longstanding. 4. Acute on chronic combined systolic and diastolic CHF- improved with IV lasix and now HD 5. HTN- stable 6. Adult failure to thrive- pt with multiple admissions in last 7 months and decline in his functional and nutritional status. He is a marginal dialysis candidate and recommend Palliative care consult to help set goals/limits of care 7. Ischemic CMP- no chest pain. 8. Dispo - ok to d/c today from my perspective if transport to dialysis arranged.  If he remains hospitalized will do HD tomorrow here to est TTS schedule  [name] [place] Pager [phone]

## 2020-01-20 NOTE — Progress Notes (Addendum)
Subjective: Interviewed patient at bedside during dialysis. He reported doing well and feeling fine. We discussed current plan of coordinating OP dialysis. He reports no questions at this time.  Objective:  Vital signs in last 24 hours: Vitals:   01/19/20 1609 01/19/20 2152 01/19/20 2358 01/20/20 0311  BP: 124/62 140/65 (!) 143/65 139/61  Pulse: 68  (!) 58 (!) 58  Resp: 17  17 17   Temp: 97.9 F (36.6 C)  97.7 F (36.5 C) (!) 97.4 F (36.3 C)  TempSrc: Oral  Oral Oral  SpO2: 95%  96% 97%  Weight:      Height:       Physical Exam Vitals and nursing note reviewed.  Constitutional:      General: He is not in acute distress.    Appearance: He is well-developed and normal weight. He is not ill-appearing or toxic-appearing.  HENT:     Head: Normocephalic and atraumatic.  Eyes:     Extraocular Movements: Extraocular movements intact.  Cardiovascular:     Rate and Rhythm: Normal rate and regular rhythm.     Pulses: Normal pulses.          Radial pulses are 2+ on the right side and 2+ on the left side.       Dorsalis pedis pulses are 2+ on the right side and 2+ on the left side.     Heart sounds: Normal heart sounds, S1 normal and S2 normal. No murmur heard.   Pulmonary:     Effort: Pulmonary effort is normal. No respiratory distress.     Breath sounds: Normal breath sounds. No wheezing.  Abdominal:     General: Abdomen is flat. There is no distension.     Palpations: Abdomen is soft. There is no mass.     Tenderness: There is no abdominal tenderness.  Musculoskeletal:     Right lower leg: No edema.     Left lower leg: No edema.  Neurological:     General: No focal deficit present.     Mental Status: He is alert. Mental status is at baseline.  Psychiatric:        Mood and Affect: Mood normal.        Behavior: Behavior normal.        Thought Content: Thought content normal.      Assessment/Plan: Dennis Macias is a 69yr old male with PMHx of DM, HTN, HLD, CKD, CVA,  CHF,with arecent hospitalization for HT emergency and HF exacerbation on 6/18.  Active Problems:   Acute on chronic anemia   Acute-on-chronic kidney injury ([place])   Hyperkalemia   CKD (chronic kidney disease), stage V ([facility])   Goals of care, counseling/discussion   ESRD (end stage renal disease) ([place])  Progressive CKD stage 5 to ESRD: Hyperkalemia: Had TDC placed on 7/8. Dialysis started on 7/8. Was getting dialysis today. Vascular created AVG fistula yesterday and patient tolerated the procedure well. Patient waiting on dialysis coordinator establishing an OP schedule to discharge. -Nephrology following, appreciate assistance -Vascular created AVG fistula yesterday tolerated well -Monitor BMP -Renal navigator assisting with OP dialysis scheduling   GOC:  Given progression to ESRD and starting dialysis will need to have continued [place] discussion. Appears to have limited understanding of the lifelong need for dialysis. He expressed understanding that he will need an outpatient dialysis center. -Palliative care is following, appreciate assistance.  -Palliative will meet with patients significant and daughter today at 24 to discuss ongoing care   Anemia of chronic  disease: Due to his ESRD. Had mild bleeding from his port post first dialysis so heparin was held.Hgb was found to be stable so it was resumed. -TrendCBC -Continue SCDs   Hyperhidrosis: Will need outpatient follow up with dermatology.    HTN: -Continue hydralazine, coreg, and amlodipine.BP has improvedand is stable   Chronic Combined Heart Failure:Euvolemic on exam.  -Continue to monitor  -Continue Coreg 25mg  BID   Prior to Admission Living Arrangement: Home Anticipated Discharge Location: Home Barriers to Discharge: establishing outpatient dialysis Dispo: Anticipated discharge in approximately1-2day(s).  Briant Cedar, MD 01/20/2020, 6:18 AM Pager: [phone] After 5pm on  weekdays and 1pm on weekends: On Call pager [phone]

## 2020-01-20 NOTE — Progress Notes (Signed)
Daily Progress Note   Patient Name: Dennis Macias       Date: 01/20/2020 DOB: 1950-11-19  Age: 69 y.o. MRN#: [id number] Attending Physician: Lucious Groves, DO Primary Care Physician: [name], MD Admit Date: 01/12/2020  Reason for Consultation/Follow-up: Establishing goals of care  Subjective: Patient is currently on dialysis.  Spoke with nurse and received update.  Patient previously provided verbal approval to via medical updates and continued discussions with his family.  I met with patient's significant other [name] and their 2 children  (son and daughter) at the bedside.    Extensive education provided to family regarding patient's current illnesses, comorbidities, expectations of care at discharge, and long-term prognosis.  Family verbalized understanding of lifelong dialysis, scheduling requirements, and dietary restrictions.  We discussed concerns regarding patient's long-term ability to tolerate dialysis due to multiple comorbidities with detailed discussion regarding cardiac and overall functional decline.  Daughter is tearful expressing concerns of dialysis being a life prolonging measure.  Support provided.  Detailed discussion regarding HD in ESRD with awareness that patient undergoing dialysis treatment is lifelong, is considered a form of life-sustaining measures with education provided on what health/care would look like if patient and family chose to discontinue dialysis with specific discussions around EOL/hospice.  Family verbalized understanding and appreciation.  Space and opportunity created to further discuss patient's current full CODE STATUS with understanding of his requests to remain a full code.  Family verbalized understanding expressing they are in agreement with patient regarding full code at this time but also if patient health was to further decline they would then to reevaluate this decision.  We discussed the importance of quality of life and support.   Encouraged family to continue ongoing discussions regarding what is most important to patient and them.  Family verbalized awareness of guarded prognosis and expressed if patient's life was more medical focused and quality of life declined any further they would want patient to consider other options and not continue with aggressive treatments in the setting of no quality.  Daughter tearful and emotional support provided.  Daughter verbalizes she does not wish to see her father suffer.   We discussed completion of advance directives.  Advanced directive packet located at patient's bedside.  I reviewed document in detail with family and again encouraged ongoing discussion between family and patient.  [name] is aware patient has expressed to him her wishes to complete document prior to discharge.  Family shares they would like to make medical decisions together and plans to further discuss with Mr. Ruggieri.  Education provided on completion of packet when patient and family is ready.  They are aware document can be completed while hospitalized or taken with them and completed at their discretion with awareness of the requirement of notarization and unrelated witnesses.  All questions answered and family verbalized appreciation of meeting.  Family expressed relief of now having a better understanding of patient's overall condition and expectations. [name] concerned with transportation needs for dialysis. She is aware Jaclyn Shaggy will be in contact regarding outpatient dialysis arrangements. (Colleen at bedside with family prior to me leaving).   Palliative Care Assessment & Plan    Code Status:  Full code  Goals of Care/Recommendations:  Outpatient Palliative support for ongoing discussions  Detailed family meeting. Education provided on guarded prognosis, long-term HD tolerance due to co-morbidities and the importance of continued support and evaluation of goals of care.   PMT will continue to support and  follow as needed. Please call  with urgent needs.   Prognosis: Guarded   Discharge Planning: Home with Home Health and palliative  Thank you for allowing the Palliative Medicine Team to assist in the care of this patient.  Time Total: 65 min.   Visit consisted of counseling and education dealing with the complex and emotionally intense issues of symptom management and palliative care in the setting of serious and potentially life-threatening illness.Greater than 50%  of this time was spent counseling and coordinating care related to the above assessment and plan.  Alda Lea, AGPCNP-BC  Palliative Medicine Team [phone]

## 2020-01-21 ENCOUNTER — Emergency Department (HOSPITAL_COMMUNITY): Payer: Medicare PPO

## 2020-01-21 ENCOUNTER — Emergency Department (HOSPITAL_COMMUNITY)
Admission: EM | Admit: 2020-01-21 | Discharge: 2020-01-22 | Disposition: A | Discharge status: Home / Self Care | Payer: Medicare PPO | Attending: Emergency Medicine | Admitting: Emergency Medicine

## 2020-01-21 ENCOUNTER — Other Ambulatory Visit: Payer: Self-pay

## 2020-01-21 ENCOUNTER — Encounter (HOSPITAL_COMMUNITY): Payer: Self-pay | Admitting: Emergency Medicine

## 2020-01-21 DIAGNOSIS — Z7982 Long term (current) use of aspirin: Secondary | ICD-10-CM | POA: Insufficient documentation

## 2020-01-21 DIAGNOSIS — N186 End stage renal disease: Secondary | ICD-10-CM | POA: Insufficient documentation

## 2020-01-21 DIAGNOSIS — I132 Hypertensive heart and chronic kidney disease with heart failure and with stage 5 chronic kidney disease, or end stage renal disease: Secondary | ICD-10-CM | POA: Insufficient documentation

## 2020-01-21 DIAGNOSIS — I5042 Chronic combined systolic (congestive) and diastolic (congestive) heart failure: Secondary | ICD-10-CM | POA: Insufficient documentation

## 2020-01-21 DIAGNOSIS — I252 Old myocardial infarction: Secondary | ICD-10-CM | POA: Insufficient documentation

## 2020-01-21 DIAGNOSIS — Z87891 Personal history of nicotine dependence: Secondary | ICD-10-CM | POA: Insufficient documentation

## 2020-01-21 DIAGNOSIS — Z79899 Other long term (current) drug therapy: Secondary | ICD-10-CM | POA: Insufficient documentation

## 2020-01-21 DIAGNOSIS — T8249XA Other complication of vascular dialysis catheter, initial encounter: Secondary | ICD-10-CM

## 2020-01-21 DIAGNOSIS — Z992 Dependence on renal dialysis: Secondary | ICD-10-CM | POA: Insufficient documentation

## 2020-01-21 DIAGNOSIS — Z8673 Personal history of transient ischemic attack (TIA), and cerebral infarction without residual deficits: Secondary | ICD-10-CM | POA: Insufficient documentation

## 2020-01-21 LAB — CBC
HCT: 25.1 % — ABNORMAL LOW (ref 39.0–52.0)
Hemoglobin: 8 g/dL — ABNORMAL LOW (ref 13.0–17.0)
MCH: 29.4 pg (ref 26.0–34.0)
MCHC: 31.9 g/dL (ref 30.0–36.0)
MCV: 92.3 fL (ref 80.0–100.0)
Platelets: 181 10*3/uL (ref 150–400)
RBC: 2.72 MIL/uL — ABNORMAL LOW (ref 4.22–5.81)
RDW: 15 % (ref 11.5–15.5)
WBC: 5.4 10*3/uL (ref 4.0–10.5)
nRBC: 0 % (ref 0.0–0.2)

## 2020-01-21 LAB — GLUCOSE, CAPILLARY: Glucose-Capillary: 99 mg/dL (ref 70–99)

## 2020-01-21 LAB — RENAL FUNCTION PANEL
Albumin: 2 g/dL — ABNORMAL LOW (ref 3.5–5.0)
Anion gap: 9 (ref 5–15)
BUN: 21 mg/dL (ref 8–23)
CO2: 26 mmol/L (ref 22–32)
Calcium: 7.7 mg/dL — ABNORMAL LOW (ref 8.9–10.3)
Chloride: 101 mmol/L (ref 98–111)
Creatinine, Ser: 2.89 mg/dL — ABNORMAL HIGH (ref 0.61–1.24)
GFR calc Af Amer: 25 mL/min — ABNORMAL LOW (ref >60)
GFR calc non Af Amer: 21 mL/min — ABNORMAL LOW (ref >60)
Glucose, Bld: 169 mg/dL — ABNORMAL HIGH (ref 70–99)
Phosphorus: 3.2 mg/dL (ref 2.5–4.6)
Potassium: 3.2 mmol/L — ABNORMAL LOW (ref 3.5–5.1)
Sodium: 136 mmol/L (ref 135–145)

## 2020-01-21 MED ORDER — HEPARIN SODIUM (PORCINE) 1000 UNIT/ML DIALYSIS: 20.0000 [IU]/kg | INTRAMUSCULAR | Status: DC | PRN

## 2020-01-21 NOTE — TOC Progression Note (Signed)
Transition of Care [facility]) - Progression Note    Patient Details  Name: Dennis Macias MRN: [id number] Date of Birth: [birth year]  Transition of Care [facility]) CM/SW Contact  Curlene Labrum, RN Phone Number: 01/21/2020, 12:54 PM  Clinical Narrative:    Case management spoke with the patient's wife and offered choice regarding outpatient palliative care services.  The wife expressed being overwhelmed in the last few days regarding dialysis and other medical conditions of the patient.  The wife did not have a preference for palliative care support.  [facility] was called and spoke with referral line and they will follow-up with the patient and family regarding outpatient support.   Expected Discharge Plan: [place] Barriers to Discharge: Continued Medical Work up  Expected Discharge Plan and Services Expected Discharge Plan: [place]   Discharge Planning Services: CM Consult Post Acute Care Choice:  (possible palliative care followup) Living arrangements for the past 2 months: Apartment                 DME Arranged:  (currently uses cane)           [place]: [facility] (currently active with [facility] for PT/RN) Date [facility] Agency Contacted: 01/19/20 Time [place]: 1431 Representative spoke with at [employer]: Memory Arg[facility] to determine if active for [facility] with patient.   Social Determinants of Health (SDOH) Interventions    Readmission Risk Interventions Readmission Risk Prevention Plan 01/19/2020 11/10/2019 08/10/2019  Transportation Screening Complete Complete Complete  PCP or Specialist Appt within 3-5 Days - - Complete  HRI or [place] - - Complete  Social Work Consult for [place] Planning/Counseling - - Complete  Palliative Care Screening - - Complete  Medication Review Press photographer) Complete Complete Complete  PCP or Specialist appointment within 3-5 days of discharge Complete - -  [place] or Home Care  Consult Complete Complete -  SW Recovery Care/Counseling Consult Complete Complete -  Palliative Care Screening Complete Not Applicable -  [place] Complete Complete -  Some recent data might be hidden
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
## 2020-01-21 NOTE — Progress Notes (Signed)
Renal Navigator met with patient at HD bedside to inform him that he has been approved for [facility] [place] transportation. His family has the information to schedule his rides for OP HD. We also reviewed the plan for starting HD in the clinic, though it is apparent that he will need family assistance to get things straight.  Navigator called patient's significant other/[name], but had to leave a message. Navigator left message that patient has been approved for [facility] [place] transportation and that she should contact them this afternoon or first thing in the morning. Navigator noted that his treatment is 4 hours and that she can make a standing order for OP HD if she wishes. Navigator left call back number if she has questions. Navigator also attempted to call patient's daughter, but voicemail box is full.  Dennis Macias, [place] Renal Navigator [phone]

## 2020-01-21 NOTE — Progress Notes (Signed)
   01/21/20 0900  Clinical Encounter Type  Visited With Patient  Visit Type Follow-up  Referral From Patient;Nurse  Consult/Referral To Chaplain  Spiritual Encounters  Spiritual Needs Other (Comment) (Advanced Directive)   Chaplain engaged in follow-up visit concerning the completion of an Advanced Directive.  Chaplain is working to get volunteers to serve as witnesses to [employer].    Chaplain will continue to follow-up.

## 2020-01-21 NOTE — Progress Notes (Signed)
Chaplain worked to complete Scientist, physiological.  Chaplain was not able to retain volunteers and notary.  Chaplain did go explain to Dennis Macias ways in which he could get the paperwork notarized outside of the hospital.   Chaplain will follow-up as needed.

## 2020-01-21 NOTE — Progress Notes (Signed)
Discharge instructions given. Patient verbalized understanding and all questions were answered.  ?

## 2020-01-21 NOTE — Progress Notes (Cosign Needed)
Subjective: Interviewed patient at bedside. No complaints overnight. States he is ready to go home today after another dialysis session. Discussed plan for outpatient dialysis and plan to have paper work completed at the Center prior to the start of the weekend.  Has no further questions at this time.  Objective:  Vital signs in last 24 hours: Vitals:   01/20/20 1920 01/20/20 2157 01/21/20 0253 01/21/20 0529  BP: (!) 136/59 133/62 137/60 (!) 144/63  Pulse: 61  67   Resp: 16  16   Temp: 99.4 F (37.4 C)  98 F (36.7 C)   TempSrc: Oral  Oral   SpO2: 96%  97%   Weight:      Height:       Physical Exam Vitals and nursing note reviewed.  Constitutional:      General: He is not in acute distress.    Appearance: He is well-developed and normal weight. He is not ill-appearing or toxic-appearing.  HENT:     Head: Normocephalic and atraumatic.  Eyes:     Extraocular Movements: Extraocular movements intact.  Cardiovascular:     Rate and Rhythm: Normal rate and regular rhythm.     Pulses: Normal pulses.          Radial pulses are 2+ on the right side and 2+ on the left side.       Dorsalis pedis pulses are 2+ on the right side and 2+ on the left side.     Heart sounds: Normal heart sounds, S1 normal and S2 normal. No murmur heard.   Pulmonary:     Effort: Pulmonary effort is normal. No respiratory distress.     Breath sounds: Normal breath sounds. No wheezing.  Abdominal:     General: There is no distension.     Palpations: Abdomen is soft. There is no mass.     Tenderness: There is no abdominal tenderness.  Musculoskeletal:     Right lower leg: No edema.     Left lower leg: No edema.  Neurological:     General: No focal deficit present.     Mental Status: He is alert. Mental status is at baseline.  Psychiatric:        Mood and Affect: Mood normal.        Behavior: Behavior normal.        Thought Content: Thought content normal.      Assessment/Plan:  Active  Problems:   Acute on chronic anemia   Acute-on-chronic kidney injury ([place])   Hyperkalemia   CKD (chronic kidney disease), stage V (HCC)   Goals of care, counseling/discussion   ESRD (end stage renal disease) ([place])  Progressive CKD stage 5 to ESRD: Hyperkalemia: Had TDC placed on 7/8. Dialysis started on 7/8. VascularcreatedAVGfistulaTuesdayand patient tolerated the procedure well and the site continues to look clean, dry, and has no erythema. Dialysis coordinator has established an OP schedule. He will be ready to discharge today. Will go to center to sign paperwork for first OP dialysis which is scheduled for Saturday. -Vascularcreated AVG fistula Tuesday tolerated well, site continues looks clean, dry, and with no erythema -Renal navigator has established an OP dialysis scheduling   GOC:  Given progression to ESRD and having started dialysis will need to have continued [place] discussions. Appears to have limited understanding of the lifelong need for dialysis. He expressed understanding that he will need an outpatient dialysis center. -Palliative care is following, appreciate assistance.  -Palliative will meet with patients significant and  daughter yesterday to discuss ongoing care    Anemia of chronic disease: Due to his ESRD. Had mild bleeding from his port post first dialysis so heparin was held.Hgb was found to be stable so it was resumed. No bleeding has happened since.Heparin was held today. -TrendCBC -Continue SCDs   Hyperhidrosis: Will need outpatient follow up with dermatology.    HTN: -Continue hydralazine, coreg, and amlodipine.BP continues to be stable   Chronic Combined Heart Failure:Euvolemic on exam.  -Continue to monitor  -Continue Coreg 25mg  BID   Prior to Admission Living Arrangement: Home Anticipated Discharge Location:Home Barriers to Discharge: none Dispo: Anticipated discharge today.   Briant Cedar, MD 01/21/2020,  6:07 AM Pager: [phone] After 5pm on weekdays and 1pm on weekends: On Call pager [phone]

## 2020-01-21 NOTE — Plan of Care (Signed)
  Problem: Education: Goal: Knowledge of General Education information will improve Description: Including pain rating scale, medication(s)/side effects and non-pharmacologic comfort measures 01/21/2020 0115 by Trixie Deis, RN Outcome: Progressing 01/21/2020 0115 by Trixie Deis, RN Outcome: Progressing   Problem: Activity: Goal: Risk for activity intolerance will decrease Outcome: Progressing   Problem: Pain Managment: Goal: General experience of comfort will improve Outcome: Progressing   Problem: Safety: Goal: Ability to remain free from injury will improve Outcome: Progressing   Problem: Skin Integrity: Goal: Risk for impaired skin integrity will decrease Outcome: Progressing

## 2020-01-21 NOTE — Progress Notes (Signed)
Patient ID: Dennis Macias, male   DOB: 09-03-1950, 69 y.o.   MRN: [id number] S:seen at HD again today to est TTS schedule, no new issues, feeling fine  O:BP (P) 130/63   Pulse (!) (P) 58   Temp 99.4 F (37.4 C) (Oral)   Resp 16   Ht 6' (1.829 m)   Wt 71.9 kg   SpO2 99%   BMI 21.50 kg/m   Intake/Output Summary (Last 24 hours) at 01/21/2020 1353 Last data filed at 01/20/2020 1700 Gross per 24 hour  Intake 240 ml  Output 300 ml  Net -60 ml   Intake/Output: I/O last 3 completed shifts: In: 240 [P.O.:240] Out: 21 [Urine:900; Other:1000]  Intake/Output this shift:  No intake/output data recorded. Weight change:  Gen: NAD CVS: no rub Resp: cta Abd: benign Ext: no edema, RUE AC fossa incision c/d/i, 1+ radial pulse and hand warm  Recent Labs  Lab 01/15/20 0744 01/17/20 1055 01/19/20 0526 01/20/20 0813 01/21/20 1154  NA 141 137 136 136 136  K 4.0 4.3 3.6 3.6 3.2*  CL 109 104 101 100 101  CO2 21* 24 25 23 26   GLUCOSE 80 113* 109* 109* 169*  BUN 60* 40* 26* 33* 21  CREATININE 4.56* 3.87* 3.34* 4.02* 2.89*  ALBUMIN 2.4* 2.1*  --  1.9* 2.0*  CALCIUM 7.9* 8.0* 8.2* 8.0* 7.7*  PHOS 6.1* 5.2*  --  4.8* 3.2   Liver Function Tests: Recent Labs  Lab 01/17/20 1055 01/20/20 0813 01/21/20 1154  ALBUMIN 2.1* 1.9* 2.0*   No results for input(s): LIPASE, AMYLASE in the last 168 hours. No results for input(s): AMMONIA in the last 168 hours. CBC: Recent Labs  Lab 01/15/20 0744 01/15/20 0744 01/17/20 1055 01/17/20 1055 01/19/20 0526 01/20/20 0813 01/21/20 1155  WBC 7.0   < > 5.6   < > 5.1 5.9 5.4  HGB 8.5*   < > 8.4*   < > 8.6* 8.2* 8.0*  HCT 26.4*   < > 26.5*   < > 26.2* 25.6* 25.1*  MCV 91.3  --  91.7  --  90.7 90.5 92.3  PLT 154   < > 130*   < > 135* 146* 181   < > = values in this interval not displayed.   Cardiac Enzymes: No results for input(s): CKTOTAL, CKMB, CKMBINDEX, TROPONINI in the last 168 hours. CBG: Recent Labs  Lab 01/18/20 2038 01/19/20 0632  01/19/20 0937 01/20/20 0649 01/21/20 0631  GLUCAP 154* 100* 128* 107* 99    Iron Studies: No results for input(s): IRON, TIBC, TRANSFERRIN, FERRITIN in the last 72 hours. Studies/Results: No results found. Marland Kitchen amLODipine  10 mg Oral Daily  . aspirin EC  81 mg Oral Daily  . atorvastatin  80 mg Oral QHS  . carvedilol  25 mg Oral BID WC  . Chlorhexidine Gluconate Cloth  6 each Topical Q0600  . clopidogrel  75 mg Oral Daily  . dorzolamide-timolol  1 drop Right Eye BID  . heparin  5,000 Units Subcutaneous Q8H  . hydrALAZINE  50 mg Oral Q8H  . mupirocin ointment   Nasal BID  . sodium chloride flush  10-40 mL Intracatheter Q12H  . sodium chloride flush  3 mL Intravenous Q12H    BMET    Component Value Date/Time   NA 136 01/21/2020 1154   NA 137 12/10/2019 1439   K 3.2 (L) 01/21/2020 1154   CL 101 01/21/2020 1154   CO2 26 01/21/2020 1154   GLUCOSE 169 (H)  01/21/2020 1154   BUN 21 01/21/2020 1154   BUN 80 (HH) 12/10/2019 1439   CREATININE 2.89 (H) 01/21/2020 1154   CALCIUM 7.7 (L) 01/21/2020 1154   GFRNONAA 21 (L) 01/21/2020 1154   GFRAA 25 (L) 01/21/2020 1154   CBC    Component Value Date/Time   WBC 5.4 01/21/2020 1155   RBC 2.72 (L) 01/21/2020 1155   HGB 8.0 (L) 01/21/2020 1155   HGB 9.6 (L) 12/10/2019 1439   HCT 25.1 (L) 01/21/2020 1155   HCT 28.4 (L) 12/10/2019 1439   PLT 181 01/21/2020 1155   PLT 285 12/10/2019 1439   MCV 92.3 01/21/2020 1155   MCV 89 12/10/2019 1439   MCH 29.4 01/21/2020 1155   MCHC 31.9 01/21/2020 1155   RDW 15.0 01/21/2020 1155   RDW 14.5 12/10/2019 1439   LYMPHSABS 1.1 12/24/2019 0852   LYMPHSABS 2.0 02/24/2018 1142   MONOABS 0.4 12/24/2019 0852   EOSABS 0.0 12/24/2019 0852   EOSABS 0.1 02/24/2018 1142   BASOSABS 0.0 12/24/2019 0852   BASOSABS 0.0 02/24/2018 1142    Assessment/Plan: 1. Progressive CKD stage V, now ESRD- underlying CKD is due to DM and HTN and has had a slowly progressive decline in his renal function over the past 7  months. He has failed outpatient diuretics with multiple hospitalizations for acute on chronic CHF. Options were discussed with him and he wished to proceed with HD. He is a marginal long term dialysis candidate given his multiple co-morbidities, especially his ischemic CMP as well as poor functional and nutritional status. 1. S/p RIJTDC placement7/8/21 by IR  2. First session of HD 01/14/20 and second on 01/15/20, 3rd was 7/11 3. HD today 4. Outpt HD will be TTS at [facility] 5. S/p AVF 7/13 - appreciate VVS assistance 2. Acute on chronic anemia of CKD stage V- s/p blood transfusion and will need to start ESA. Follow H/H and transfuse prn.   1. Given sq dose 01/16/20 since we don't know his schedule for outpatient HD yet.  Next dose outpt 3. hyperhidrosis- has been seen as an outpatient for this and is longstanding. 4. Acute on chronic combined systolic and diastolic CHF- improved with IV lasix and now HD 5. HTN- stable 6. Adult failure to thrive- pt with multiple admissions in last 7 months and decline in his functional and nutritional status. He is a marginal dialysis candidate and recommend Palliative care consult to help set goals/limits of care 7. Ischemic CMP- no chest pain. 8. Dispo - ok to d/c today from my perspective if transport to dialysis arranged.   [name] [facility] Pager [phone]

## 2020-01-21 NOTE — Anesthesia Postprocedure Evaluation (Signed)
Anesthesia Post Note  Patient: Dennis Macias  Procedure(s) Performed: BRACHIOCEPHALIC ARTERIOVENOUS (AV) FISTULA CREATION (Right Arm Upper)     Patient location during evaluation: PACU Anesthesia Type: MAC Level of consciousness: awake and alert Pain management: pain level controlled Vital Signs Assessment: post-procedure vital signs reviewed and stable Respiratory status: spontaneous breathing, nonlabored ventilation, respiratory function stable and patient connected to nasal cannula oxygen Cardiovascular status: stable and blood pressure returned to baseline Postop Assessment: no apparent nausea or vomiting Anesthetic complications: no   No complications documented.  Last Vitals:  Vitals:   01/21/20 0726 01/21/20 1300  BP: (!) 167/69 (P) 130/63  Pulse: (!) 58 (!) (P) 58  Resp: 16   Temp: 37.4 C   SpO2: 99%     Last Pain:  Vitals:   01/21/20 0931  TempSrc:   PainSc: 0-No pain                 Ruford Dudzinski

## 2020-01-22 ENCOUNTER — Ambulatory Visit: Payer: Medicare PPO | Admitting: *Deleted

## 2020-01-22 ENCOUNTER — Telehealth: Payer: Self-pay | Admitting: Internal Medicine

## 2020-01-22 DIAGNOSIS — I10 Essential (primary) hypertension: Secondary | ICD-10-CM

## 2020-01-22 DIAGNOSIS — N186 End stage renal disease: Secondary | ICD-10-CM

## 2020-01-22 DIAGNOSIS — I5042 Chronic combined systolic (congestive) and diastolic (congestive) heart failure: Secondary | ICD-10-CM

## 2020-01-22 NOTE — Chronic Care Management (AMB) (Signed)
Chronic Care Management   Follow Up Note   01/22/2020 Name: Dennis Macias MRN: [id number] DOB: [birth year]  Referred by: [name], MD Reason for referral : Chronic Care Management (DM, HTN, ESRD, CAD, HLD)   Dennis Macias is a 69 y.o. year old male who is a primary care patient of [name], [name], MD. The CCM team was consulted for assistance with chronic disease management and care coordination needs.    Review of patient status, including review of consultants reports, relevant laboratory and other test results, and collaboration with appropriate care team members and the patient's provider was performed as part of comprehensive patient evaluation and provision of chronic care management services.    SDOH (Social Determinants of Health) assessments performed: No See Care Plan activities for detailed interventions related to [facility])     Outpatient Encounter Medications as of 01/22/2020  Medication Sig Note  . amLODipine (NORVASC) 10 MG tablet Take 1 tablet (10 mg total) by mouth daily. 01/22/2020: Caregiver states she was told by dialysis staff not to give patient this medication on the mornings that he has dialysis  . aspirin EC 81 MG tablet Take 1 tablet (81 mg total) by mouth daily.   Marland Kitchen atorvastatin (LIPITOR) 80 MG tablet Take 1 tablet (80 mg total) by mouth at bedtime. IM program 01/13/2020: LF 12/03/19 90DS  . carvedilol (COREG) 25 MG tablet Take 1 tablet (25 mg total) by mouth 2 (two) times daily with a meal. 01/22/2020: Caregiver states she was told by dialysis staff not to give this medication on the morning that he has dialysis  . clopidogrel (PLAVIX) 75 MG tablet Take 1 tablet (75 mg total) by mouth daily. 01/13/2020: LF 12/31/19 30DS  . dorzolamide-timolol (COSOPT) 22.3-6.8 MG/ML ophthalmic solution Place 1 drop into the right eye 2 (two) times daily. 01/13/2020: LF 12/03/19 75DS  . finasteride (PROSCAR) 5 MG tablet Take 1 tablet (5 mg total) by mouth daily. 01/13/2020: LF 12/31/19 30DS  .  furosemide (LASIX) 80 MG tablet Take 1 tablet (80 mg total) by mouth 2 (two) times daily. 01/22/2020: Caregiver states she was told by dialysis staff not to give this medication on the morning that patient has dialysis  . hydrALAZINE (APRESOLINE) 50 MG tablet Take 1 tablet (50 mg total) by mouth every 8 (eight) hours. 01/22/2020: Caregiver states she was told by dialysis staff not to give this medication on the morning patient has dialysis  . tamsulosin (FLOMAX) 0.4 MG CAPS capsule Take 1 capsule (0.4 mg total) by mouth daily after supper. 01/13/2020: LF 10/12/19 90DS  . vitamin B-12 (CYANOCOBALAMIN) 1000 MCG tablet Take 1 tablet (1,000 mcg total) by mouth daily.   . Accu-Chek FastClix Lancets MISC Check blood sugar up to 7 times a week as instructed   . acetaminophen (TYLENOL) 325 MG tablet Take 1-2 tablets (325-650 mg total) by mouth every 4 (four) hours as needed for mild pain.   . Blood Glucose Monitoring Suppl (ACCU-CHEK GUIDE) w/Device KIT 1 each by Does not apply route daily. Check blood sugar as instructed up to 7 times a week   . glucose blood (ACCU-CHEK GUIDE) test strip Check blood sugar up to 7 times a week as instructed   . isosorbide mononitrate (IMDUR) 60 MG 24 hr tablet Take 1 tablet (60 mg total) by mouth daily. (Patient taking differently: Take 60 mg by mouth daily. Significant other says patient is taking this medication) 01/13/2020: LF 12/31/19 30DS  . [DISCONTINUED] ciprofloxacin (CIPRO) 250 MG tablet Take 2 tablets (  500 mg total) by mouth daily with breakfast. (Patient not taking: Reported on 01/13/2020)    No facility-administered encounter medications on file as of 01/22/2020.     Objective:  Wt Readings from Last 3 Encounters:  01/21/20 151 lb 14.4 oz (68.9 kg)  12/26/19 157 lb 3.2 oz (71.3 kg)  12/14/19 170 lb (77.1 kg)   Lab Results  Component Value Date   HGBA1C 5.9 (H) 11/14/2019   HGBA1C 5.2 10/13/2019   HGBA1C 5.3 06/15/2019   Lab Results  Component Value Date   LDLCALC  48 12/25/2019   CREATININE 2.89 (H) 01/21/2020   Lab Results  Component Value Date   CHOL 120 12/25/2019   HDL 58 12/25/2019   LDLCALC 48 12/25/2019   TRIG 68 12/25/2019   CHOLHDL 2.1 12/25/2019   BP Readings from Last 3 Encounters:  01/22/20 (!) 153/70  01/21/20 (!) 145/73  12/26/19 120/60    Goals Addressed              This Visit's Progress     Patient Stated   .  Significant other stated "I have some questions about who I should call if [name] has medical problems. " (pt-stated)        CARE PLAN ENTRY (see longitudinal plan of care for additional care plan information)  Current Barriers:  Marland Kitchen Knowledge Deficits related to hospital  discharge instructions  Nurse Case Manager Clinical Goal(s):  Marland Kitchen Over the next 7- 14 days, patient's significant other will verbalize basic understanding of hospital discharge instructions and recognize the appropriate provider to call for patient's medical problems.    Interventions:  . Inter-disciplinary care team collaboration (see longitudinal plan of care) . Provided education to patient's significant other about hospital discharge instructions . Reviewed medications with caregiver and assessed patient's  medication taking behavior . Ensured caregiver has phone numbers for home health agency and palliative and medical providers . Discussed plans with patient for ongoing care management follow up and provided patient with direct contact information for care management team . Reviewed scheduled/upcoming provider appointments including: message left today by Palliative Care staff on patient's home phone and caregiver's cell phone  . Reviewed upcoming appointments including first OP dialysis session tomorrow . Encouraged caregiver to call and make post hospital discharge follow up clinic appointment . Advised patient, providing education and rationale, to weigh daily and record, calling cardiologist and/or nephrologist for weight gain of 3 lbs  overnight or 5 pounds in a week.   Patient Self Care Activities:  . Patient takes medications as prescribed with caregiver's assistance . Attends all scheduled provider appointments . Caregiver calls pharmacy for medication refills . Caregiver calls provider office for new concerns or questions . Unable to self administer medications as prescribed . Unable to perform ADLs independently . Unable to perform IADLs independently  Initial goal documentation         Plan:   The care management team will reach out to the patient again over the next 7 days.    Kelli Churn RN, CCM, [facility] RN Care Manager [phone]

## 2020-01-22 NOTE — ED Notes (Signed)
All discharge instructions reviewed with pt and spouse at bedside. No questions at this time. Pt discharged without issue.

## 2020-01-22 NOTE — ED Notes (Signed)
Vascath dressing changed using sterile technique per MD. Previous dressing was not over insertion site.

## 2020-01-22 NOTE — Discharge Instructions (Addendum)
Thank you for allowing [place] to care for you today! Please attend your dialysis session tomorrow. If any changes occur or new symptoms arise, please return to the closest emergency department.

## 2020-01-22 NOTE — Progress Notes (Signed)
Internal Medicine Clinic Resident  I have personally reviewed this encounter including the documentation in this note and/or discussed this patient with the care management provider. I will address any urgent items identified by the care management provider and will communicate my actions to the patient's PCP. I have reviewed the patient's CCM visit with my supervising attending, Dr Dareen Piano.  Harvie Heck, MD 01/22/2020

## 2020-01-22 NOTE — ED Provider Notes (Signed)
[facility] EMERGENCY DEPARTMENT Provider Note   CSN: [id number] Arrival date & time: 01/21/20  1857     History Chief Complaint  Patient presents with  . Vascular Access Problem    Leaman Abe is a 69 y.o. male.  Ezrah Panning is a 69 y/o M who presents to the ED with his spouse for potential pulling out and misplacement of his dialysis catheter in his right upper arm. The patient was attempting to walk from his car to the house when he felt unsteady. During the process of getting from the car to the house, the patient thought his dialysis catheter was pulled on. Patient's wife called EMS to assess the catheter, they recommended that the patient go to the ED to have area assessed.   Patient denies any pain at the area of the catheter. Denies any fever,chills, chest pain, n/v/d. No other complaints at this time.   The history is provided by the patient and the spouse.      Past Medical History:  Diagnosis Date  . Anemia   . Arthritis    past hx   . Blindness    right eye  . Cardiorenal syndrome   . Cataract    removed both eyes  . Dehydration   . Diabetes ([place])   . Glaucoma   . History of CVA (cerebrovascular accident) 09/13/2015  . History of stroke 09/13/2015  . History of urinary retention   . Hyperlipidemia   . Hypertension   . NSTEMI (non-ST elevated myocardial infarction) ([place])   . Pernicious anemia 02/24/2018  . S/P TURP   . Stroke [facility])    2017- March  . Syncope 11/2019  . Tachycardia 08/26/2017  . Tubular adenoma of colon 02/2017  . Weight loss, non-intentional 08/26/2017   10 lbs between 6/18 & 2/19    Patient Active Problem List   Diagnosis Date Noted  . ESRD (end stage renal disease) ([place])   . CKD (chronic kidney disease), stage V ([place])   . Goals of care, counseling/discussion   . Acute-on-chronic kidney injury ([place]) 01/13/2020  . Hyperkalemia   . Shortness of breath 12/24/2019  . Acute on chronic combined systolic and diastolic CHF  (congestive heart failure) ([place]) 12/24/2019  . Non-Obstructive CAD 12/14/2019  . Encephalopathy, metabolic 62/37/6283  . BPH (benign prostatic hyperplasia) 12/08/2019  . Anemia of chronic disease   . Protein-calorie malnutrition, severe 11/19/2019  . Acute ischemic left ICA stroke ([place])   . Gait disorder   . Chronic combined systolic and diastolic heart failure ([place])   . Non-STEMI (non-ST elevated myocardial infarction) ([place])   . Pleural effusion transudative   . Nephrotic syndrome 08/05/2019  . Protein calorie malnutrition ([place]) 07/31/2019  . Left renal mass 07/21/2019  . CKD (chronic kidney disease) stage 4, GFR 15-29 ml/min ([facility]) 04/23/2019  . Weight loss, non-intentional 08/26/2017  . 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 Acute on chronic anemia 12/21/2016  . Hyperlipidemia 09/13/2015  . Diabetes mellitus with retinopathy of both eyes ([place])   . Essential hypertension     Past Surgical History:  Procedure Laterality Date  . AV FISTULA PLACEMENT Right 01/19/2020   Procedure: BRACHIOCEPHALIC ARTERIOVENOUS (AV) FISTULA CREATION;  Surgeon: Waynetta Sandy, MD;  Location: [place];  Service: Vascular;  Laterality: Right;  . CATARACT EXTRACTION, BILATERAL    . COLONOSCOPY    . IR FLUORO GUIDE CV LINE RIGHT  01/14/2020  . IR THORACENTESIS ASP PLEURAL SPACE W/IMG GUIDE  09/21/2019  .  IR THORACENTESIS ASP PLEURAL SPACE W/IMG GUIDE  10/16/2019  . IR US GUIDE VASC ACCESS RIGHT  01/15/2020  . LEFT HEART CATH AND CORONARY ANGIOGRAPHY N/A 11/19/2019   Procedure: LEFT HEART CATH AND CORONARY ANGIOGRAPHY;  Surgeon: Jettie Booze, MD;  Location: [place] [facility] CV LAB;  Service: Cardiovascular;  Laterality: N/A;  . POLYPECTOMY    . REFRACTIVE SURGERY  10/2017  . TEE WITHOUT CARDIOVERSION N/A 09/14/2015   Procedure: TRANSESOPHAGEAL ECHOCARDIOGRAM (TEE);  Surgeon: Larey Dresser, MD;  Location: [facility];  Service: Cardiovascular;  Laterality: N/A;  . TRANSURETHRAL RESECTION OF PROSTATE N/A  10/20/2019   Procedure: TRANSURETHRAL RESECTION OF THE PROSTATE (TURP);  Surgeon: Irine Seal, MD;  Location: [facility];  Service: Urology;  Laterality: N/A;  . UPPER GASTROINTESTINAL ENDOSCOPY         Family History  Problem Relation Age of Onset  . Hypertension Mother   . Hyperlipidemia Mother   . Hyperlipidemia Father   . Colon cancer Neg Hx   . Colon polyps Neg Hx   . Esophageal cancer Neg Hx   . Rectal cancer Neg Hx   . Stomach cancer Neg Hx     Social History   Tobacco Use  . Smoking status: Former Research scientist (life sciences)  . Smokeless tobacco: Former Systems developer    Types: Chew    Quit date: 07/09/1978  . Tobacco comment: quit 1 year ago  Vaping Use  . Vaping Use: Never used  Substance Use Topics  . Alcohol use: No    Alcohol/week: 1.0 standard drink    Types: 1 Cans of beer per week    Comment: quit last march/2017  . Drug use: No    Home Medications Prior to Admission medications   Medication Sig Start Date End Date Taking? Authorizing Provider  Accu-Chek FastClix Lancets MISC Check blood sugar up to 7 times a week as instructed 12/03/19   Angiulli, Lavon Paganini, PA-C  acetaminophen (TYLENOL) 325 MG tablet Take 1-2 tablets (325-650 mg total) by mouth every 4 (four) hours as needed for mild pain. 12/03/19   Angiulli, Lavon Paganini, PA-C  amLODipine (NORVASC) 10 MG tablet Take 1 tablet (10 mg total) by mouth daily. 12/27/19   Seawell, Jaimie A, DO  aspirin EC 81 MG tablet Take 1 tablet (81 mg total) by mouth daily. 10/22/19   Eugenie Filler, MD  atorvastatin (LIPITOR) 80 MG tablet Take 1 tablet (80 mg total) by mouth at bedtime. IM program 12/03/19   Cathlyn Parsons, PA-C  Blood Glucose Monitoring Suppl (ACCU-CHEK GUIDE) w/Device KIT 1 each by Does not apply route daily. Check blood sugar as instructed up to 7 times a week 12/03/19   Angiulli, Lavon Paganini, PA-C  carvedilol (COREG) 25 MG tablet Take 1 tablet (25 mg total) by mouth 2 (two) times daily with a meal. 01/04/20   [name], MD    ciprofloxacin (CIPRO) 250 MG tablet Take 2 tablets (500 mg total) by mouth daily with breakfast. Patient not taking: Reported on 01/13/2020 12/27/19   Molli Hazard A, DO  clopidogrel (PLAVIX) 75 MG tablet Take 1 tablet (75 mg total) by mouth daily. 12/03/19   Angiulli, Lavon Paganini, PA-C  dorzolamide-timolol (COSOPT) 22.3-6.8 MG/ML ophthalmic solution Place 1 drop into the right eye 2 (two) times daily. 12/03/19   Angiulli, Lavon Paganini, PA-C  finasteride (PROSCAR) 5 MG tablet Take 1 tablet (5 mg total) by mouth daily. 12/03/19   Angiulli, Lavon Paganini, PA-C  furosemide (LASIX) 80 MG tablet Take 1  tablet (80 mg total) by mouth 2 (two) times daily. 12/15/19   Lucious Groves, DO  glucose blood (ACCU-CHEK GUIDE) test strip Check blood sugar up to 7 times a week as instructed 12/03/19   Angiulli, Lavon Paganini, PA-C  hydrALAZINE (APRESOLINE) 50 MG tablet Take 1 tablet (50 mg total) by mouth every 8 (eight) hours. 12/26/19   Seawell, Jaimie A, DO  isosorbide mononitrate (IMDUR) 60 MG 24 hr tablet Take 1 tablet (60 mg total) by mouth daily. 12/03/19 01/13/20  Angiulli, Lavon Paganini, PA-C  tamsulosin (FLOMAX) 0.4 MG CAPS capsule Take 1 capsule (0.4 mg total) by mouth daily after supper. 12/03/19   Angiulli, Lavon Paganini, PA-C  vitamin B-12 (CYANOCOBALAMIN) 1000 MCG tablet Take 1 tablet (1,000 mcg total) by mouth daily. 12/03/19   Angiulli, Lavon Paganini, PA-C    Allergies    Cefepime  Review of Systems   Review of Systems  Constitutional: Negative for chills.  Respiratory: Negative for shortness of breath.   Cardiovascular: Negative for chest pain.  Gastrointestinal: Negative for abdominal pain, diarrhea, nausea and vomiting.  All other systems reviewed and are negative.   Physical Exam Updated Vital Signs BP 138/63 (BP Location: Left Arm)   Pulse (!) 50   Temp 97.6 F (36.4 C) (Oral)   Resp 16   SpO2 100%   Physical Exam Vitals and nursing note reviewed.  Constitutional:      General: He is not in acute distress.     Appearance: Normal appearance. He is normal weight. He is not ill-appearing, toxic-appearing or diaphoretic.  HENT:     Head: Atraumatic.  Cardiovascular:     Rate and Rhythm: Normal rate and regular rhythm.     Pulses: Normal pulses.     Heart sounds: Normal heart sounds.  Pulmonary:     Effort: Pulmonary effort is normal. No respiratory distress.     Breath sounds: Normal breath sounds. No wheezing, rhonchi or rales.  Abdominal:     General: Abdomen is flat. There is no distension.     Palpations: Abdomen is soft.     Tenderness: There is no abdominal tenderness.  Musculoskeletal:     Cervical back: Normal range of motion.  Skin:    General: Skin is warm.     Comments: Right Brachiocephalic Catheter sewn in place. No tenderness or erythema around the site.  Dressing of catheter displaced with ecchymosis around entry site.  Neurological:     General: No focal deficit present.     Mental Status: He is alert and oriented to person, place, and time.    Fistula with steady pulse.   ED Results / Procedures / Treatments   Labs (all labs ordered are listed, but only abnormal results are displayed) Labs Reviewed - No data to display  EKG None  Radiology DG Chest 2 View  Result Date: 01/21/2020 CLINICAL DATA:  69 year old male who was falling and in the process of being caught thinks his dialysis catheter placed earlier today was partially pulled out. EXAM: CHEST - 2 VIEW COMPARISON:  Chest radiograph 01/13/2020 and earlier. FINDINGS: There is a right chest tunneled type dual lumen dialysis catheter in place. The catheter tips project at the lower SVC level. No catheter kinking or discontinuity identified. Stable lung volumes and mediastinal contours. No pneumothorax. Regressed but not resolved bilateral pleural effusions, now small. No pulmonary edema. No consolidation. No acute osseous abnormality identified. Negative visible bowel gas pattern. IMPRESSION: 1. Right chest tunneled  type dialysis catheter  in place, with catheter tips at the lower SVC level and no adverse features identified. 2. Small bilateral pleural effusions have regressed from earlier this month. Electronically Signed   By: Genevie Ann M.D.   On: 01/21/2020 20:10    Procedures Procedures (including critical care time)  Medications Ordered in ED Medications - No data to display  ED Course  I have reviewed the triage vital signs and the nursing notes.  Pertinent labs & imaging results that were available during my care of the patient were reviewed by me and considered in my medical decision making (see chart for details).    MDM Rules/Calculators/A&P                          Izic Stfort is a 69 y/o M with a PMHx of ESRD, HTN, DM who presents to the ED for potential movement and displacement of his right brachiocephalic catheter. Patient has no tenderness of the site, sutured into place, the dressing is displaced with ecchymosis around the site. The area does not appear sterile.   X-Ray revealed catheter in place.   Will have the site re-dressed. Patient has dialysis session tomorrow.    Final Clinical Impression(s) / ED Diagnoses Final diagnoses:  Other complication of vascular dialysis catheter, initial encounter [facility])    Rx / DC Orders ED Discharge Orders    None       Riesa Pope, MD 01/22/20 0301    Merrily Pew, MD 01/22/20 [phone]

## 2020-01-22 NOTE — Patient Instructions (Signed)
Visit Information It was nice speaking with you today.  Please call and make post hospital discharge clinic appointment for 1 week.   Goals Addressed              This Visit's Progress     Patient Stated   .  Significant other stated "I have some questions about who I should call if Dennis Macias has medical problems. " (pt-stated)        CARE PLAN ENTRY (see longitudinal plan of care for additional care plan information)  Current Barriers:  Marland Kitchen Knowledge Deficits related to hospital  discharge instructions- spoke with patient then at patient's request reviewed hospital discharge instructions with significant other Dennis Macias as she oversees his care  Nurse Case Manager Clinical Goal(s):  Marland Kitchen Over the next 7- 14 days, patient's significant other will verbalize basic understanding of hospital discharge instructions and recognize the appropriate provider to call for patient's medical problems.    Interventions:  . Inter-disciplinary care team collaboration (see longitudinal plan of care) . Provided education to patient's significant other about hospital discharge instructions . Reviewed medications with caregiver and assessed patient's  medication taking behavior . Ensured caregiver has phone numbers for home health agency and palliative and medical providers . Discussed plans with patient for ongoing care management follow up and provided patient with direct contact information for care management team . Reviewed scheduled/upcoming provider appointments including: message left today by Palliative Care staff on patient's home phone and caregiver's cell phone  . Reviewed upcoming appointments including first OP dialysis session tomorrow . Encouraged caregiver to call and make post hospital discharge follow up clinic appointment . Advised patient, providing education and rationale, to weigh daily and record, calling cardiologist and/or nephrologist for weight gain of 3 lbs overnight or 5 pounds in a  week.   Patient Self Care Activities:  . Patient takes medications as prescribed with caregiver's assistance . Attends all scheduled provider appointments . Caregiver calls pharmacy for medication refills . Caregiver calls provider office for new concerns or questions . Unable to self administer medications as prescribed . Unable to perform ADLs independently . Unable to perform IADLs independently  Initial goal documentation        The patient verbalized understanding of instructions provided today and declined a print copy of patient instruction materials.   The care management team will reach out to the patient again over the next 7 days.   Kelli Churn RN, CCM, [facility] RN Care Manager [phone]

## 2020-01-22 NOTE — Telephone Encounter (Signed)
Called patient's home number and wife's cell, to offer to schedule a Palliative Consult, no answer - left message at both numbers with reason for call along with my name and number requesting a return call.

## 2020-01-25 ENCOUNTER — Other Ambulatory Visit: Payer: Self-pay | Admitting: Internal Medicine

## 2020-01-25 ENCOUNTER — Telehealth: Payer: Self-pay | Admitting: Internal Medicine

## 2020-01-25 ENCOUNTER — Other Ambulatory Visit: Payer: Self-pay

## 2020-01-25 DIAGNOSIS — I5042 Chronic combined systolic (congestive) and diastolic (congestive) heart failure: Secondary | ICD-10-CM

## 2020-01-25 DIAGNOSIS — D631 Anemia in chronic kidney disease: Secondary | ICD-10-CM | POA: Diagnosis not present

## 2020-01-25 DIAGNOSIS — I5043 Acute on chronic combined systolic (congestive) and diastolic (congestive) heart failure: Secondary | ICD-10-CM | POA: Diagnosis not present

## 2020-01-25 DIAGNOSIS — I255 Ischemic cardiomyopathy: Secondary | ICD-10-CM | POA: Diagnosis not present

## 2020-01-25 DIAGNOSIS — I0981 Rheumatic heart failure: Secondary | ICD-10-CM | POA: Diagnosis not present

## 2020-01-25 DIAGNOSIS — N184 Chronic kidney disease, stage 4 (severe): Secondary | ICD-10-CM | POA: Diagnosis not present

## 2020-01-25 DIAGNOSIS — I5041 Acute combined systolic (congestive) and diastolic (congestive) heart failure: Secondary | ICD-10-CM

## 2020-01-25 DIAGNOSIS — E1122 Type 2 diabetes mellitus with diabetic chronic kidney disease: Secondary | ICD-10-CM | POA: Diagnosis not present

## 2020-01-25 DIAGNOSIS — I130 Hypertensive heart and chronic kidney disease with heart failure and stage 1 through stage 4 chronic kidney disease, or unspecified chronic kidney disease: Secondary | ICD-10-CM | POA: Diagnosis not present

## 2020-01-25 DIAGNOSIS — T361X5D Adverse effect of cephalosporins and other beta-lactam antibiotics, subsequent encounter: Secondary | ICD-10-CM | POA: Diagnosis not present

## 2020-01-25 DIAGNOSIS — G92 Toxic encephalopathy: Secondary | ICD-10-CM | POA: Diagnosis not present

## 2020-01-25 NOTE — Telephone Encounter (Signed)
Rec' message that wife had returned my call from 01/22/20  Called wife back to offer to schedule a Palliative consult with patient, no answer - left message with my name and contact number, requesting a return call.

## 2020-01-25 NOTE — Telephone Encounter (Signed)
Rec'd return call from patient's wife, [name], spoke with her regarding In-home Palliative services and all questions were answered.  I rec'd verbal consent from patient via telephone.  I have scheduled an In-person Consult for 02/05/20 @ 11:15 AM.

## 2020-01-25 NOTE — Telephone Encounter (Signed)
Pharmacy has already requested refills via surescript.

## 2020-01-25 NOTE — Progress Notes (Signed)
Internal Medicine Clinic Attending  CCM services provided by the care management provider and their documentation were discussed with Dr. Marva Panda. We reviewed the pertinent findings, urgent action items addressed by the resident and non-urgent items to be addressed by the PCP.  I agree with the assessment, diagnosis, and plan of care documented in the CCM and resident's note.  Aldine Contes, MD 01/25/2020

## 2020-01-25 NOTE — Telephone Encounter (Signed)
amLODipine (NORVASC) 10 MG tablet  clopidogrel (PLAVIX) 75 MG tablet  furosemide (LASIX) 80 MG tablet  isosorbide mononitrate (IMDUR) 60 MG 24 hr tablet(Expired  hydrALAZINE (APRESOLINE) 50 MG tablet, refill request @  [place], [place] - [address] [facility]. Phone:  [phone]  Fax:  [phone]

## 2020-01-26 ENCOUNTER — Telehealth: Payer: Self-pay | Admitting: *Deleted

## 2020-01-26 MED FILL — hydrALAZINE HCL 25 MG TABS: 25 | 30 days supply | Qty: 270 | Fill #0

## 2020-01-26 MED FILL — AMLODIPINE BESYLATE 5 MG TA: 5 | 30 days supply | Qty: 30 | Fill #0

## 2020-01-26 MED FILL — CLOPIDOGREL 75 MG TABLET: 75 | 30 days supply | Qty: 30 | Fill #0

## 2020-01-26 MED FILL — ISOSORBIDE MN ER 60 MG TAB: 60 | 30 days supply | Qty: 30 | Fill #0

## 2020-01-26 MED FILL — FUROSEMIDE 80 MG TAB: 80 | 30 days supply | Qty: 60 | Fill #0

## 2020-01-26 NOTE — Telephone Encounter (Signed)
Dr bloomfield, steve furr, pharmacist at [facility] needs a call asap from you about pt scripts. Please call his office [phone] if he does not answer please call [phone] and ask for him. thanks

## 2020-01-27 DIAGNOSIS — I255 Ischemic cardiomyopathy: Secondary | ICD-10-CM | POA: Diagnosis not present

## 2020-01-27 DIAGNOSIS — I5043 Acute on chronic combined systolic (congestive) and diastolic (congestive) heart failure: Secondary | ICD-10-CM | POA: Diagnosis not present

## 2020-01-27 DIAGNOSIS — I130 Hypertensive heart and chronic kidney disease with heart failure and stage 1 through stage 4 chronic kidney disease, or unspecified chronic kidney disease: Secondary | ICD-10-CM | POA: Diagnosis not present

## 2020-01-27 DIAGNOSIS — G92 Toxic encephalopathy: Secondary | ICD-10-CM | POA: Diagnosis not present

## 2020-01-27 DIAGNOSIS — N184 Chronic kidney disease, stage 4 (severe): Secondary | ICD-10-CM | POA: Diagnosis not present

## 2020-01-27 DIAGNOSIS — E1122 Type 2 diabetes mellitus with diabetic chronic kidney disease: Secondary | ICD-10-CM | POA: Diagnosis not present

## 2020-01-27 DIAGNOSIS — T361X5D Adverse effect of cephalosporins and other beta-lactam antibiotics, subsequent encounter: Secondary | ICD-10-CM | POA: Diagnosis not present

## 2020-01-27 DIAGNOSIS — D631 Anemia in chronic kidney disease: Secondary | ICD-10-CM | POA: Diagnosis not present

## 2020-01-27 DIAGNOSIS — I0981 Rheumatic heart failure: Secondary | ICD-10-CM | POA: Diagnosis not present

## 2020-01-28 ENCOUNTER — Ambulatory Visit: Payer: Medicare PPO | Admitting: *Deleted

## 2020-01-28 DIAGNOSIS — E785 Hyperlipidemia, unspecified: Secondary | ICD-10-CM

## 2020-01-28 DIAGNOSIS — I10 Essential (primary) hypertension: Secondary | ICD-10-CM

## 2020-01-28 DIAGNOSIS — I2510 Atherosclerotic heart disease of native coronary artery without angina pectoris: Secondary | ICD-10-CM

## 2020-01-28 DIAGNOSIS — N186 End stage renal disease: Secondary | ICD-10-CM

## 2020-01-28 NOTE — Chronic Care Management (AMB) (Signed)
Chronic Care Management   Follow Up Note   01/28/2020 Name: Dennis Macias MRN: [id number] DOB: 1950-10-25  Referred by: [name], MD Reason for referral : Chronic Care Management (DM, HTN, HLD, ESRD)   Dennis Macias is a 69 y.o. year old male who is a primary care patient of [name], [name], MD. The CCM team was consulted for assistance with chronic disease management and care coordination needs.    Review of patient status, including review of consultants reports, relevant laboratory and other test results, and collaboration with appropriate care team members and the patient's provider was performed as part of comprehensive patient evaluation and provision of chronic care management services.    SDOH (Social Determinants of Health) assessments performed: No See Care Plan activities for detailed interventions related to [facility])     Outpatient Encounter Medications as of 01/28/2020  Medication Sig Note  . Accu-Chek FastClix Lancets MISC Check blood sugar up to 7 times a week as instructed   . acetaminophen (TYLENOL) 325 MG tablet Take 1-2 tablets (325-650 mg total) by mouth every 4 (four) hours as needed for mild pain.   Marland Kitchen amLODipine (NORVASC) 10 MG tablet Take 1 tablet (10 mg total) by mouth daily. 01/22/2020: Caregiver states she was told by dialysis staff not to give patient this medication on the mornings that he has dialysis  . amLODipine (NORVASC) 5 MG tablet TAKE 1 TABLET BY MOUTH ONCE DAILY.   Marland Kitchen aspirin EC 81 MG tablet Take 1 tablet (81 mg total) by mouth daily.   Marland Kitchen atorvastatin (LIPITOR) 80 MG tablet Take 1 tablet (80 mg total) by mouth at bedtime. IM program 01/13/2020: LF 12/03/19 90DS  . Blood Glucose Monitoring Suppl (ACCU-CHEK GUIDE) w/Device KIT 1 each by Does not apply route daily. Check blood sugar as instructed up to 7 times a week   . carvedilol (COREG) 25 MG tablet Take 1 tablet (25 mg total) by mouth 2 (two) times daily with a meal. 01/22/2020: Caregiver states she was told  by dialysis staff not to give this medication on the morning that he has dialysis  . clopidogrel (PLAVIX) 75 MG tablet TAKE 1 TABLET BY MOUTH ONCE DAILY.   Marland Kitchen dorzolamide-timolol (COSOPT) 22.3-6.8 MG/ML ophthalmic solution Place 1 drop into the right eye 2 (two) times daily. 01/13/2020: LF 12/03/19 75DS  . finasteride (PROSCAR) 5 MG tablet Take 1 tablet (5 mg total) by mouth daily. 01/13/2020: LF 12/31/19 30DS  . furosemide (LASIX) 80 MG tablet TAKE 1 TABLET (80 MG TOTAL) BY MOUTH 2 (TWO) TIMES DAILY.   Marland Kitchen glucose blood (ACCU-CHEK GUIDE) test strip Check blood sugar up to 7 times a week as instructed   . hydrALAZINE (APRESOLINE) 25 MG tablet TAKE 3 TABLETS (75 MG TOTAL) BY MOUTH 3 (THREE) TIMES DAILY.   . hydrALAZINE (APRESOLINE) 50 MG tablet Take 1 tablet (50 mg total) by mouth every 8 (eight) hours. 01/22/2020: Caregiver states she was told by dialysis staff not to give this medication on the morning patient has dialysis  . isosorbide mononitrate (IMDUR) 60 MG 24 hr tablet TAKE 1 TABLET (60 MG TOTAL) BY MOUTH DAILY.   . tamsulosin (FLOMAX) 0.4 MG CAPS capsule Take 1 capsule (0.4 mg total) by mouth daily after supper. 01/13/2020: LF 10/12/19 90DS  . vitamin B-12 (CYANOCOBALAMIN) 1000 MCG tablet Take 1 tablet (1,000 mcg total) by mouth daily.    No facility-administered encounter medications on file as of 01/28/2020.     Objective:  Lab Results  Component Value  Date   HGBA1C 5.9 (H) 11/14/2019   HGBA1C 5.2 10/13/2019   HGBA1C 5.3 06/15/2019   Lab Results  Component Value Date   LDLCALC 48 12/25/2019   CREATININE 2.89 (H) 01/21/2020   Lab Results  Component Value Date   CHOL 120 12/25/2019   HDL 58 12/25/2019   LDLCALC 48 12/25/2019   TRIG 68 12/25/2019   CHOLHDL 2.1 12/25/2019   Wt Readings from Last 3 Encounters:  01/21/20 151 lb 14.4 oz (68.9 kg)  12/26/19 157 lb 3.2 oz (71.3 kg)  12/14/19 170 lb (77.1 kg)   BP Readings from Last 3 Encounters:  01/22/20 (!) 153/70  01/21/20 (!) 145/73   12/26/19 120/60    Goals Addressed              This Visit's Progress     Patient Stated   .  Significant other stated "Tytus is doing OK, he had his first 3 dialysis treatments without problems" (pt-stated)        CARE PLAN ENTRY (see longitudinal plan of care for additional care plan information)  Current Barriers:  . Chronic Disease Management support and education needs related to ESRD, DM, HTN, HLD, HF and CAD- spoke with patient's significant other, she says patient tolerated his first 3 hemodialysis sessions without problems, she says patient does not check his blood sugars because his doctor told since he doesn't take any medications for his diabetes and his blood sugar control is good as indicated by his Hgb A1C of 5.9% on 11/14/19  Nurse Case Manager Clinical Goal(s):  Over the next 30 -60 days, patient's significant other  will:   Work with the care management team to address educational, disease management, and care coordination needs   Begin or continue self health monitoring activities as directed -  take medications as directed  Call provider office for new or worsened signs and symptoms related to CHF, CAD, HTN, HLD, DMII and ESRD  Call care management team with questions or concerns  Verbalize basic understanding of patient centered plan of care established today  Interventions:  . Inter-disciplinary care team collaboration (see longitudinal plan of care) . Reviewed medications with caregiver and assessed patient's  medication taking behavior . Ensured caregiver has phone numbers for home health agency and palliative and medical providers . Discussed plans with patient for ongoing care management follow up and provided patient with direct contact information for care management team . Reviewed scheduled/upcoming provider appointments including: clinic appointment on 02/01/20 . Advised significant other this CCM RN will plan to meet with them during his clinic  appointment on 02/01/20 . Advised patient's caregiver, providing education and rationale, to weigh daily and record, calling cardiologist and/or nephrologist for weight gain of 3 lbs overnight or 5 pounds in a week.   Patient Self Care Activities:  . Patient takes medications as prescribed with caregiver's assistance . Attends all scheduled provider appointments . Caregiver calls pharmacy for medication refills . Caregiver calls provider office for new concerns or questions . Unable to self administer medications as prescribed . Unable to perform ADLs independently . Unable to perform IADLs independently  Please see past updates related to this goal by clicking on the "Past Updates" button in the selected goal          Plan:   Face to Face appointment with care management team member scheduled for: 02/01/20  Kelli Churn RN, CCM, [facility] RN Care Manager [phone]

## 2020-01-28 NOTE — Patient Instructions (Signed)
Visit Information It was nice speaking with you today. I will plan to meet with you during your clinic appointment on 02/01/20  Goals Addressed              This Visit's Progress     Patient Stated   .  Significant other stated "Dennis Macias is doing OK, he had his first 3 dialysis treatments without problems" (pt-stated)        CARE PLAN ENTRY (see longitudinal plan of care for additional care plan information)  Current Barriers:  . Chronic Disease Management support and education needs related to ESRD, DM, HTN, HLD, HF and CAD- spoke with patient's significant other, she says patient tolerated his first 3 hemodialysis sessions without problems, she says patient does not check his blood sugars because his doctor told since he doesn't take any medications for his diabetes and his blood sugar control is good as indicated by his Hgb A1C of 5.9% on 11/14/19  Nurse Case Manager Clinical Goal(s):  Over the next 30 -60 days, patient's significant other  will:   Work with the care management team to address educational, disease management, and care coordination needs   Begin or continue self health monitoring activities as directed -  take medications as directed  Call provider office for new or worsened signs and symptoms related to CHF, CAD, HTN, HLD, DMII and ESRD  Call care management team with questions or concerns  Verbalize basic understanding of patient centered plan of care established today  Interventions:  . Inter-disciplinary care team collaboration (see longitudinal plan of care) . Reviewed medications with caregiver and assessed patient's  medication taking behavior . Ensured caregiver has phone numbers for home health agency and palliative and medical providers . Discussed plans with patient for ongoing care management follow up and provided patient with direct contact information for care management team . Reviewed scheduled/upcoming provider appointments including: clinic  appointment on 02/01/20 . Advised significant other this CCM RN will plan to meet with them during his clinic appointment on 02/01/20 . Advised patient's caregiver, providing education and rationale, to weigh daily and record, calling cardiologist and/or nephrologist for weight gain of 3 lbs overnight or 5 pounds in a week.   Patient Self Care Activities:  . Patient takes medications as prescribed with caregiver's assistance . Attends all scheduled provider appointments . Caregiver calls pharmacy for medication refills . Caregiver calls provider office for new concerns or questions . Unable to self administer medications as prescribed . Unable to perform ADLs independently . Unable to perform IADLs independently  Please see past updates related to this goal by clicking on the "Past Updates" button in the selected goal         The patient verbalized understanding of instructions provided today and declined a print copy of patient instruction materials.   Face to Face appointment with care management team member scheduled for: 02/01/20  Kelli Churn RN, CCM, [facility] RN Care Manager [phone]

## 2020-01-29 ENCOUNTER — Ambulatory Visit: Payer: Medicare PPO

## 2020-01-29 DIAGNOSIS — I255 Ischemic cardiomyopathy: Secondary | ICD-10-CM | POA: Diagnosis not present

## 2020-01-29 DIAGNOSIS — N184 Chronic kidney disease, stage 4 (severe): Secondary | ICD-10-CM | POA: Diagnosis not present

## 2020-01-29 DIAGNOSIS — E1122 Type 2 diabetes mellitus with diabetic chronic kidney disease: Secondary | ICD-10-CM | POA: Diagnosis not present

## 2020-01-29 DIAGNOSIS — I130 Hypertensive heart and chronic kidney disease with heart failure and stage 1 through stage 4 chronic kidney disease, or unspecified chronic kidney disease: Secondary | ICD-10-CM | POA: Diagnosis not present

## 2020-01-29 DIAGNOSIS — I0981 Rheumatic heart failure: Secondary | ICD-10-CM | POA: Diagnosis not present

## 2020-01-29 DIAGNOSIS — D631 Anemia in chronic kidney disease: Secondary | ICD-10-CM | POA: Diagnosis not present

## 2020-01-29 DIAGNOSIS — T361X5D Adverse effect of cephalosporins and other beta-lactam antibiotics, subsequent encounter: Secondary | ICD-10-CM | POA: Diagnosis not present

## 2020-01-29 DIAGNOSIS — G92 Toxic encephalopathy: Secondary | ICD-10-CM | POA: Diagnosis not present

## 2020-01-29 DIAGNOSIS — I5043 Acute on chronic combined systolic (congestive) and diastolic (congestive) heart failure: Secondary | ICD-10-CM | POA: Diagnosis not present

## 2020-01-29 NOTE — Progress Notes (Signed)
Internal Medicine Clinic Resident  I have personally reviewed this encounter including the documentation in this note and/or discussed this patient with the care management provider. I will address any urgent items identified by the care management provider and will communicate my actions to the patient's PCP. I have reviewed the patient's CCM visit with my supervising attending, Dr Heber [place].  Delice Bison, DO 01/29/2020

## 2020-01-29 NOTE — Chronic Care Management (AMB) (Signed)
  Chronic Care Management   Outreach Note  01/29/2020 Name: Dennis Macias MRN: [id number] DOB: 07/11/1950  Referred by: [name], MD Reason for referral : Care Coordination (Medicaid application/SDOH)   Left voicemail message with patient.  Caregiver called back and put patient on the phone. Patient had difficulty hearing me and disconnected call. Left voicemail message for patient's spouse requesting return call.       Ronn Melena, [place] Coordination Social Worker [place] [phone]

## 2020-02-01 ENCOUNTER — Encounter: Payer: Self-pay | Admitting: Podiatry

## 2020-02-01 ENCOUNTER — Other Ambulatory Visit: Payer: Self-pay

## 2020-02-01 ENCOUNTER — Ambulatory Visit: Payer: Medicare PPO | Admitting: Podiatry

## 2020-02-01 ENCOUNTER — Encounter: Payer: Self-pay | Admitting: Internal Medicine

## 2020-02-01 ENCOUNTER — Other Ambulatory Visit: Payer: Self-pay | Admitting: Neurology

## 2020-02-01 ENCOUNTER — Ambulatory Visit: Payer: Medicare PPO | Admitting: *Deleted

## 2020-02-01 ENCOUNTER — Ambulatory Visit (INDEPENDENT_AMBULATORY_CARE_PROVIDER_SITE_OTHER): Payer: Medicare PPO | Admitting: Internal Medicine

## 2020-02-01 DIAGNOSIS — I4891 Unspecified atrial fibrillation: Secondary | ICD-10-CM

## 2020-02-01 DIAGNOSIS — I63412 Cerebral infarction due to embolism of left middle cerebral artery: Secondary | ICD-10-CM

## 2020-02-01 DIAGNOSIS — R55 Syncope and collapse: Secondary | ICD-10-CM

## 2020-02-01 DIAGNOSIS — I998 Other disorder of circulatory system: Secondary | ICD-10-CM

## 2020-02-01 DIAGNOSIS — E785 Hyperlipidemia, unspecified: Secondary | ICD-10-CM

## 2020-02-01 DIAGNOSIS — E11319 Type 2 diabetes mellitus with unspecified diabetic retinopathy without macular edema: Secondary | ICD-10-CM

## 2020-02-01 DIAGNOSIS — Z992 Dependence on renal dialysis: Secondary | ICD-10-CM

## 2020-02-01 DIAGNOSIS — B351 Tinea unguium: Secondary | ICD-10-CM | POA: Diagnosis not present

## 2020-02-01 DIAGNOSIS — I739 Peripheral vascular disease, unspecified: Secondary | ICD-10-CM | POA: Diagnosis not present

## 2020-02-01 DIAGNOSIS — E1142 Type 2 diabetes mellitus with diabetic polyneuropathy: Secondary | ICD-10-CM

## 2020-02-01 DIAGNOSIS — N186 End stage renal disease: Secondary | ICD-10-CM

## 2020-02-01 DIAGNOSIS — I10 Essential (primary) hypertension: Secondary | ICD-10-CM

## 2020-02-01 DIAGNOSIS — I639 Cerebral infarction, unspecified: Secondary | ICD-10-CM

## 2020-02-01 DIAGNOSIS — M79674 Pain in right toe(s): Secondary | ICD-10-CM | POA: Diagnosis not present

## 2020-02-01 DIAGNOSIS — E0822 Diabetes mellitus due to underlying condition with diabetic chronic kidney disease: Secondary | ICD-10-CM | POA: Diagnosis not present

## 2020-02-01 DIAGNOSIS — M79675 Pain in left toe(s): Secondary | ICD-10-CM

## 2020-02-01 DIAGNOSIS — I2510 Atherosclerotic heart disease of native coronary artery without angina pectoris: Secondary | ICD-10-CM

## 2020-02-01 MED ORDER — DOXYCYCLINE HYCLATE 100 MG PO CAPS: 100.0000 mg | ORAL_CAPSULE | Freq: Two times a day (BID) | ORAL | 0 refills | Status: AC

## 2020-02-01 MED FILL — DOXYCYCLINE HYCLATE 100 MG: 100 | 7 days supply | Qty: 14 | Fill #0

## 2020-02-01 NOTE — Chronic Care Management (AMB) (Signed)
Chronic Care Management   Follow Up Note   02/01/2020 Name: Dennis Macias MRN: [id number] DOB: 08-Feb-1951  Referred by: Dennis Hansen, MD Reason for referral : Chronic Care Management (DM, ESRD, HLD, CAD, s/p CVA)   Dennis Macias is a 69 y.o. year old male who is a primary care patient of Dennis Macias, Dennis Dell, MD. The CCM team was consulted for assistance with chronic disease management and care coordination needs.    Review of patient status, including review of consultants reports, relevant laboratory and other test results, and collaboration with appropriate care team members and the patient's provider was performed as part of comprehensive patient evaluation and provision of chronic care management services.    SDOH (Social Determinants of Health) assessments performed: No See Care Plan activities for detailed interventions related to [facility])     Outpatient Encounter Medications as of 02/01/2020  Medication Sig Note  . Accu-Chek FastClix Lancets MISC Check blood sugar up to 7 times a week as instructed   . acetaminophen (TYLENOL) 325 MG tablet Take 1-2 tablets (325-650 mg total) by mouth every 4 (four) hours as needed for mild pain.   Marland Kitchen amLODipine (NORVASC) 10 MG tablet Take 1 tablet (10 mg total) by mouth daily. 01/22/2020: Caregiver states she was told by dialysis staff not to give patient this medication on the mornings that he has dialysis  . amLODipine (NORVASC) 5 MG tablet TAKE 1 TABLET BY MOUTH ONCE DAILY.   Marland Kitchen aspirin EC 81 MG tablet Take 1 tablet (81 mg total) by mouth daily.   Marland Kitchen atorvastatin (LIPITOR) 80 MG tablet Take 1 tablet (80 mg total) by mouth at bedtime. IM program 01/13/2020: LF 12/03/19 90DS  . Blood Glucose Monitoring Suppl (ACCU-CHEK GUIDE) w/Device KIT 1 each by Does not apply route daily. Check blood sugar as instructed up to 7 times a week   . carvedilol (COREG) 25 MG tablet Take 1 tablet (25 mg total) by mouth 2 (two) times daily with a meal. 01/22/2020: Caregiver states she  was told by dialysis staff not to give this medication on the morning that he has dialysis  . clopidogrel (PLAVIX) 75 MG tablet TAKE 1 TABLET BY MOUTH ONCE DAILY.   Marland Kitchen dorzolamide-timolol (COSOPT) 22.3-6.8 MG/ML ophthalmic solution Place 1 drop into the right eye 2 (two) times daily. 01/13/2020: LF 12/03/19 75DS  . finasteride (PROSCAR) 5 MG tablet Take 1 tablet (5 mg total) by mouth daily. 01/13/2020: LF 12/31/19 30DS  . furosemide (LASIX) 80 MG tablet TAKE 1 TABLET (80 MG TOTAL) BY MOUTH 2 (TWO) TIMES DAILY.   Marland Kitchen glucose blood (ACCU-CHEK GUIDE) test strip Check blood sugar up to 7 times a week as instructed   . hydrALAZINE (APRESOLINE) 25 MG tablet TAKE 3 TABLETS (75 MG TOTAL) BY MOUTH 3 (THREE) TIMES DAILY.   . hydrALAZINE (APRESOLINE) 50 MG tablet Take 1 tablet (50 mg total) by mouth every 8 (eight) hours. 01/22/2020: Caregiver states she was told by dialysis staff not to give this medication on the morning patient has dialysis  . isosorbide mononitrate (IMDUR) 60 MG 24 hr tablet TAKE 1 TABLET (60 MG TOTAL) BY MOUTH DAILY.   . tamsulosin (FLOMAX) 0.4 MG CAPS capsule Take 1 capsule (0.4 mg total) by mouth daily after supper. 01/13/2020: LF 10/12/19 90DS  . vitamin B-12 (CYANOCOBALAMIN) 1000 MCG tablet Take 1 tablet (1,000 mcg total) by mouth daily.    No facility-administered encounter medications on file as of 02/01/2020.     Objective:  BP Readings from  Last 3 Encounters:  02/01/20 (!) 103/50  01/22/20 (!) 153/70  01/21/20 (!) 145/73   Wt Readings from Last 3 Encounters:  02/01/20 158 lb (71.7 kg)  01/21/20 151 lb 14.4 oz (68.9 kg)  12/26/19 157 lb 3.2 oz (71.3 kg)   Lab Results  Component Value Date   HGBA1C 5.9 (H) 11/14/2019   HGBA1C 5.2 10/13/2019   HGBA1C 5.3 06/15/2019   Lab Results  Component Value Date   LDLCALC 48 12/25/2019   CREATININE 2.89 (H) 01/21/2020    Goals Addressed              This Visit's Progress     Patient Stated   .  Significant other stated "Dennis Macias is  doing OK, he had his first 3 dialysis treatments without problems" (pt-stated)        CARE PLAN ENTRY (see longitudinal plan of care for additional care plan information)  Current Barriers:  . Chronic Disease Management support and education needs related to ESRD, DM, HTN, HLD, HF and CAD- met with patient and his son during clinic visit, also spoke with patient's significant other Dennis Macias via phone, patient states he is doing OK, tolerating dialysis,  no complints voiced. Dennis Macias says they truned in all the paperwork for Medicaid about 4 weeks ago and they  are waiting for a decision, she says patient does not check his blood sugars because his doctor told him since he doesn't take any medications for his diabetes and his blood sugar control is good it is not necessary to monitor his blood sugar  Nurse Case Manager Clinical Goal(s):  Over the next 30 -60 days, patient's significant other  will:   Work with the care management team to address educational, disease management, and care coordination needs   Begin or continue self health monitoring activities as directed -  take medications as directed  Call provider office for new or worsened signs and symptoms related to CHF, CAD, HTN, HLD, DMII and ESRD  Call care management team with questions or concerns  Verbalize basic understanding of patient centered plan of care established today  Interventions:  . Inter-disciplinary care team collaboration (see longitudinal plan of care) . Met with patient and son during clinic visit and provided patient with [place] Management spiral bound calendar . Discussed plans with patient for ongoing care management follow up and provided patient with direct contact information for care management team- placed business cards of CCM RN and BSW in the card holder of the calendar . Reviewed scheduled/upcoming provider appointments including:  appointment with podiatrist later today . Advised  patient's caregiver, providing education and rationale, to weigh daily and record, calling nephrologist (per direction of primary care team member Dr Koleen Distance) for weight gain of 3 lbs overnight or 5 pounds in a week.   Patient Self Care Activities:  . Patient takes medications as prescribed with caregiver's assistance . Attends all scheduled provider appointments . Caregiver calls pharmacy for medication refills . Caregiver calls provider office for new concerns or questions . Unable to self administer medications as prescribed . Unable to perform ADLs independently . Unable to perform IADLs independently  Please see past updates related to this goal by clicking on the "Past Updates" button in the selected goal          Plan:   The care management team will reach out to the patient again over the next 30-60 days.    Dennis Churn RN, CCM, [facility]  RN Care Manager [phone]

## 2020-02-01 NOTE — Patient Instructions (Signed)
Apply Betadine to left great toe once daily. Keep digit clean.  We will attempt to get your appointment with [facility] V[facility] moved up so you can be seen sooner.

## 2020-02-01 NOTE — Patient Instructions (Signed)
Visit Information It was nice to meet you today. Please use the calendar to record your daily weights and call the nephrologist for weight gain of 3 lbs overnight or 5 lbs in a week. Goals Addressed              This Visit's Progress     Patient Stated   .  Significant other stated "Dennis Macias is doing OK, he had his first 3 dialysis treatments without problems" (pt-stated)        CARE PLAN ENTRY (see longitudinal plan of care for additional care plan information)  Current Barriers:  . Chronic Disease Management support and education needs related to ESRD, DM, HTN, HLD, HF and CAD- met with patient and his son during clinic visit, also spoke with patient's significant other [name] via phone, patient states he is doing OK, tolerating dialysis,  no complints voiced. [name] says they truned in all the paperwork for Medicaid about 4 weeks ago and they  are waiting for a decision, she says patient does not check his blood sugars because his doctor told him since he doesn't take any medications for his diabetes and his blood sugar control is good it is not necessary to monitor his blood sugar  Nurse Case Manager Clinical Goal(s):  Over the next 30 -60 days, patient's significant other  will:   Work with the care management team to address educational, disease management, and care coordination needs   Begin or continue self health monitoring activities as directed -  take medications as directed  Call provider office for new or worsened signs and symptoms related to CHF, CAD, HTN, HLD, DMII and ESRD  Call care management team with questions or concerns  Verbalize basic understanding of patient centered plan of care established today  Interventions:  . Inter-disciplinary care team collaboration (see longitudinal plan of care) . Met with patient and son during clinic visit and provided patient with [facility] Care Management spiral bound calendar . Discussed plans with patient for  ongoing care management follow up and provided patient with direct contact information for care management team- placed business cards of CCM RN and BSW in the card holder of the calendar . Reviewed scheduled/upcoming provider appointments including:  appointment with podiatrist later today . Advised patient's caregiver, providing education and rationale, to weigh daily and record, calling nephrologist (per direction of primary care team member Dr [place]) for weight gain of 3 lbs overnight or 5 pounds in a week.   Patient Self Care Activities:  . Patient takes medications as prescribed with caregiver's assistance . Attends all scheduled provider appointments . Caregiver calls pharmacy for medication refills . Caregiver calls provider office for new concerns or questions . Unable to self administer medications as prescribed . Unable to perform ADLs independently . Unable to perform IADLs independently  Please see past updates related to this goal by clicking on the "Past Updates" button in the selected goal         The patient verbalized understanding of instructions provided today and declined a print copy of patient instruction materials.   The care management team will reach out to the patient again over the next 30-60 days.     RN, CCM, [facility] Clinic RN Care Manager [phone]  

## 2020-02-01 NOTE — Patient Instructions (Signed)
Dennis Macias, It was good seeing you. Glad dialysis is going well.   We will not make any changes to your medications today.   Please let us know if you need anything in the mean time, but otherwise you can follow-up with your primary care doctor in a couple of months.   Take care, Dr. Koleen Distance

## 2020-02-01 NOTE — Progress Notes (Signed)
Subjective:  Patient ID: Dennis Macias, male    DOB: [birth year],  MRN: [id number]  69 y.o. male presents with at risk foot care with h/o NIDDM with ESRD on hemodialysis and painful thick toenails that are difficult to trim. Pain interferes with ambulation. Aggravating factors include wearing enclosed shoe gear. Pain is relieved with periodic professional debridement.Marland Kitchen    He is accompanied by his significant other's son on today's visit; he was not aware of any foot injury.  Patient states he hit his left foot on a vacuum cleaner about 2 weeks ago. He states it bled, but eventually stopped. He did not notify his family about it.  Review of Systems: Negative except as noted in the HPI. Denies N/V/F/Ch.  Past Medical History:  Diagnosis Date  . Anemia   . Arthritis    past hx   . Blindness    right eye  . Cardiorenal syndrome   . Cataract    removed both eyes  . Dehydration   . Diabetes ([place])   . Glaucoma   . History of CVA (cerebrovascular accident) 09/13/2015  . History of stroke 09/13/2015  . History of urinary retention   . Hyperlipidemia   . Hypertension   . NSTEMI (non-ST elevated myocardial infarction) ([place])   . Pernicious anemia 02/24/2018  . S/P TURP   . Stroke [facility])    2017- March  . Syncope 11/2019  . Tachycardia 08/26/2017  . Tubular adenoma of colon 02/2017  . Weight loss, non-intentional 08/26/2017   10 lbs between 6/18 & 2/19   Past Surgical History:  Procedure Laterality Date  . AV FISTULA PLACEMENT Right 01/19/2020   Procedure: BRACHIOCEPHALIC ARTERIOVENOUS (AV) FISTULA CREATION;  Surgeon: Waynetta Sandy, MD;  Location: [place];  Service: Vascular;  Laterality: Right;  . CATARACT EXTRACTION, BILATERAL    . COLONOSCOPY    . IR FLUORO GUIDE CV LINE RIGHT  01/14/2020  . IR THORACENTESIS ASP PLEURAL SPACE W/IMG GUIDE  09/21/2019  . IR THORACENTESIS ASP PLEURAL SPACE W/IMG GUIDE  10/16/2019  . IR US GUIDE VASC ACCESS RIGHT  01/15/2020  . LEFT HEART CATH AND CORONARY  ANGIOGRAPHY N/A 11/19/2019   Procedure: LEFT HEART CATH AND CORONARY ANGIOGRAPHY;  Surgeon: Jettie Booze, MD;  Location: [place] CV LAB;  Service: Cardiovascular;  Laterality: N/A;  . POLYPECTOMY    . REFRACTIVE SURGERY  10/2017  . TEE WITHOUT CARDIOVERSION N/A 09/14/2015   Procedure: TRANSESOPHAGEAL ECHOCARDIOGRAM (TEE);  Surgeon: Larey Dresser, MD;  Location: [place];  Service: Cardiovascular;  Laterality: N/A;  . TRANSURETHRAL RESECTION OF PROSTATE N/A 10/20/2019   Procedure: TRANSURETHRAL RESECTION OF THE PROSTATE (TURP);  Surgeon: Irine Seal, MD;  [facility];  Service: Urology;  Laterality: N/A;  . UPPER GASTROINTESTINAL ENDOSCOPY     Patient Active Problem List   Diagnosis Date Noted  . ESRD (end stage renal disease) ([place])   . CKD (chronic kidney disease), stage V ([employer])   . Goals of care, counseling/discussion   . Acute-on-chronic kidney injury ([place]) 01/13/2020  . Hyperkalemia   . Shortness of breath 12/24/2019  . Acute on chronic combined systolic and diastolic CHF (congestive heart failure) ([place]) 12/24/2019  . Non-Obstructive CAD 12/14/2019  . Encephalopathy, metabolic 25/42/7062  . BPH (benign prostatic hyperplasia) 12/08/2019  . Anemia of chronic disease   . Protein-calorie malnutrition, severe 11/19/2019  . Acute ischemic left ICA stroke ([place])   . Gait disorder   . Chronic combined systolic and diastolic heart failure ([place])   .  Non-STEMI (non-ST elevated myocardial infarction) ([place])   . Pleural effusion transudative   . Nephrotic syndrome 08/05/2019  . Protein calorie malnutrition ([place]) 07/31/2019  . Left renal mass 07/21/2019  . CKD (chronic kidney disease) stage 4, GFR 15-29 ml/min (HCC) 04/23/2019  . Weight loss, non-intentional 08/26/2017  . 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 Acute on chronic anemia 12/21/2016  . Hyperlipidemia 09/13/2015  . Diabetes mellitus with retinopathy of both eyes ([place])   . Essential hypertension     Current  Outpatient Medications:  .  Accu-Chek FastClix Lancets MISC, Check blood sugar up to 7 times a week as instructed, Disp: 102 each, Rfl: 3 .  acetaminophen (TYLENOL) 325 MG tablet, Take 1-2 tablets (325-650 mg total) by mouth every 4 (four) hours as needed for mild pain., Disp:  , Rfl:  .  amLODipine (NORVASC) 10 MG tablet, Take 1 tablet (10 mg total) by mouth daily., Disp: 30 tablet, Rfl: 0 .  amLODipine (NORVASC) 5 MG tablet, TAKE 1 TABLET BY MOUTH ONCE DAILY., Disp: 30 tablet, Rfl: 0 .  aspirin EC 81 MG tablet, Take 1 tablet (81 mg total) by mouth daily., Disp:  , Rfl:  .  atorvastatin (LIPITOR) 80 MG tablet, Take 1 tablet (80 mg total) by mouth at bedtime. IM program, Disp: 90 tablet, Rfl: 3 .  Blood Glucose Monitoring Suppl (ACCU-CHEK GUIDE) w/Device KIT, 1 each by Does not apply route daily. Check blood sugar as instructed up to 7 times a week, Disp: 1 kit, Rfl: 0 .  carvedilol (COREG) 25 MG tablet, Take 1 tablet (25 mg total) by mouth 2 (two) times daily with a meal., Disp: 180 tablet, Rfl: 1 .  ciprofloxacin (CIPRO) 250 MG tablet, Take 500 mg by mouth every morning., Disp: , Rfl:  .  clopidogrel (PLAVIX) 75 MG tablet, TAKE 1 TABLET BY MOUTH ONCE DAILY., Disp: 30 tablet, Rfl: 1 .  CVS ACETAMINOPHEN 325 MG CAPS, SMARTSIG:2 Capsule(s) By Mouth Every 4 Hours PRN, Disp: , Rfl:  .  dorzolamide-timolol (COSOPT) 22.3-6.8 MG/ML ophthalmic solution, Place 1 drop into the right eye 2 (two) times daily., Disp: 10 mL, Rfl: 10 .  doxycycline (VIBRAMYCIN) 100 MG capsule, Take 1 capsule (100 mg total) by mouth 2 (two) times daily for 7 days., Disp: 14 capsule, Rfl: 0 .  finasteride (PROSCAR) 5 MG tablet, Take 1 tablet (5 mg total) by mouth daily., Disp: 30 tablet, Rfl: 0 .  furosemide (LASIX) 80 MG tablet, TAKE 1 TABLET (80 MG TOTAL) BY MOUTH 2 (TWO) TIMES DAILY., Disp: 60 tablet, Rfl: 0 .  glucose blood (ACCU-CHEK GUIDE) test strip, Check blood sugar up to 7 times a week as instructed, Disp: 100 each, Rfl:  3 .  hydrALAZINE (APRESOLINE) 25 MG tablet, TAKE 3 TABLETS (75 MG TOTAL) BY MOUTH 3 (THREE) TIMES DAILY., Disp: 270 tablet, Rfl: 0 .  hydrALAZINE (APRESOLINE) 50 MG tablet, Take 1 tablet (50 mg total) by mouth every 8 (eight) hours., Disp: 90 tablet, Rfl: 0 .  isosorbide mononitrate (IMDUR) 60 MG 24 hr tablet, TAKE 1 TABLET (60 MG TOTAL) BY MOUTH DAILY., Disp: 30 tablet, Rfl: 0 .  tamsulosin (FLOMAX) 0.4 MG CAPS capsule, Take 1 capsule (0.4 mg total) by mouth daily after supper., Disp: 30 capsule, Rfl: 0 .  vitamin B-12 (CYANOCOBALAMIN) 1000 MCG tablet, Take 1 tablet (1,000 mcg total) by mouth daily., Disp: 30 tablet, Rfl: 0 Allergies  Allergen Reactions  . Cefepime Other (See Comments)    Pt had BAD encephalopathy from  Cefepime   Social History   Tobacco Use  Smoking Status Former Smoker  Smokeless Tobacco Former Systems developer  . Types: Chew  . Quit date: 07/09/1978  Tobacco Comment   quit 1 year ago   Objective:  There were no vitals filed for this visit. Constitutional Patient is a pleasant 69 y.o. male  in NAD.Marland Kitchen  Vascular Capillary fill time to digits <3 seconds right lower extremity. Pedal hair absent. Lower extremity skin temperature gradient warm to cool. No edema noted b/l lower extremities. Ischemia noted to L hallux. No cyanosis or clubbing noted.  Neurologic Normal speech. Oriented to person, place, and time. Protective sensation diminished with 10g monofilament b/l.  Dermatologic Pedal skin is thin shiny, atrophic b/l lower extremities. No open wounds bilaterally. No interdigital macerations bilaterally. Toenails 1-5 b/l elongated, discolored, dystrophic, thickened, crumbly with subungual debris and tenderness to dorsal palpation.  Orthopedic: Normal muscle strength 5/5 to all lower extremity muscle groups bilaterally. No pain crepitus or joint limitation noted with ROM b/l. Hammertoes noted to the b/l lower extremities. Utilizes rollator for ambulation assistance.   Area of eschar  measures 2.0 x 1.8 cm at distal tip of digit.   Superficial wound medial aspect of left hallux 0.5 x 0.8 cm.     Hemoglobin A1C Latest Ref Rng & Units 11/14/2019 10/13/2019 06/15/2019  HGBA1C 4.8 - 5.6 % 5.9(H) 5.2 5.3  Some recent data might be hidden   Assessment:   1. Ischemia of toe   2. Pain due to onychomycosis of toenails of both feet   3. PAD (peripheral artery disease) ([place])   4. Diabetic peripheral neuropathy associated with type 2 diabetes mellitus ([place])   5. Diabetes mellitus due to underlying condition with chronic kidney disease on chronic dialysis, without long-term current use of insulin ([place])    Plan:  Patient was evaluated and treated and all questions answered.  Onychomycosis with pain -Nails palliatively debridement as below. -Educated on self-care  Procedure: Nail Debridement Rationale: Pain Type of Debridement: manual, sharp debridement. Instrumentation: Nail nipper, rotary burr. Number of Nails: 9  -Examined patient. -Needs arterial studies performed. Has appointment with [facility] on February 26, 2020. Will call to see if we can get him in earlier.  -Given instructions to apply Betadine Solution to left hallux once daily.  -Doxycycline 100 mg tabs. Take 1 tablet (100 mg total) by mouth 2 (two) times daily for one week. -Toenails 2-5 bilaterally and R hallux debrided in length and girth without iatrogenic bleeding with sterile nail nipper and dremel.  -Patient to report any pedal injuries to medical professional immediately. -Patient to continue soft, supportive shoe gear daily. -Patient/POA to call should there be question/concern in the interim.  Return in about 3 months (around 05/03/2020) for diabetic nail and callus trim.  Marzetta Board, DPM

## 2020-02-02 ENCOUNTER — Telehealth: Payer: Self-pay | Admitting: *Deleted

## 2020-02-02 ENCOUNTER — Encounter: Payer: Self-pay | Admitting: Internal Medicine

## 2020-02-02 NOTE — Telephone Encounter (Signed)
-----   Message from Marzetta Board, DPM sent at 02/02/2020  8:11 AM EDT ----- Regarding: Needs appointment with [facility] Sx ASAP Good morning Val!  Dennis Macias needs appt with [facility] asap. He is scheduled to see them on Aug 20th, but he needs to be seen sooner.  I did reach out to Physician Asst Rhyne, via ambulatory referral to vascular surgery portal, but did not receive a response.  Clinicals are done.  He has early ischemia of left hallux. He is a diabetic on dialysis with PAD.  Thanks so much, Dr. Darnell Level.

## 2020-02-02 NOTE — Assessment & Plan Note (Signed)
Patient presents for hospital follow-up. He presented with evidence of progression from advanced CKD to ESRD and was agreeable to initiation of dialysis. He is accompanied by his son today and both indicate HD is going fairly well. He denies any acute concerns or complaints today.  His blood pressure is on the softer side, but he denies any symptoms. Will defer management of blood pressure medications to nephrology as they see fit.  He is euvolemic on exam today.

## 2020-02-02 NOTE — Progress Notes (Signed)
Internal Medicine Clinic Attending  Case discussed with Dr. Bloomfield  At the time of the visit.  We reviewed the resident's history and exam and pertinent patient test results.  I agree with the assessment, diagnosis, and plan of care documented in the resident's note.  

## 2020-02-02 NOTE — Telephone Encounter (Signed)
Ps sig other calls and states she doesn't know which BP med to stop giving pt or what to do about it. Read visit note in [facility] from 7/26 and pt's BP meds to be managed by nephrology. Advised sig other to call [facility] and speak w/ nurse. She is agreeable

## 2020-02-02 NOTE — Progress Notes (Signed)
Acute Office Visit  Subjective:    Patient ID: Dennis Macias, male    DOB: [birth year], 69 y.o.   MRN: [id number]  Chief Complaint  Patient presents with  . Hospitalization Follow-up    HPI Patient is in today for hospital follow-up for recent progression of CKD V to ESRD and initiation of HD. Please see problem based charting for further details.   Past Medical History:  Diagnosis Date  . Anemia   . Arthritis    past hx   . Blindness    right eye  . Cardiorenal syndrome   . Cataract    removed both eyes  . Dehydration   . Diabetes ([place])   . Glaucoma   . History of CVA (cerebrovascular accident) 09/13/2015  . History of stroke 09/13/2015  . History of urinary retention   . Hyperlipidemia   . Hypertension   . NSTEMI (non-ST elevated myocardial infarction) ([place])   . Pernicious anemia 02/24/2018  . S/P TURP   . Stroke [facility])    2017- March  . Syncope 11/2019  . Tachycardia 08/26/2017  . Tubular adenoma of colon 02/2017  . Weight loss, non-intentional 08/26/2017   10 lbs between 6/18 & 2/19    Past Surgical History:  Procedure Laterality Date  . AV FISTULA PLACEMENT Right 01/19/2020   Procedure: BRACHIOCEPHALIC ARTERIOVENOUS (AV) FISTULA CREATION;  Surgeon: Waynetta Sandy, MD;  Location: [place];  Service: Vascular;  Laterality: Right;  . CATARACT EXTRACTION, BILATERAL    . COLONOSCOPY    . IR FLUORO GUIDE CV LINE RIGHT  01/14/2020  . IR THORACENTESIS ASP PLEURAL SPACE W/IMG GUIDE  09/21/2019  . IR THORACENTESIS ASP PLEURAL SPACE W/IMG GUIDE  10/16/2019  . IR US GUIDE VASC ACCESS RIGHT  01/15/2020  . LEFT HEART CATH AND CORONARY ANGIOGRAPHY N/A 11/19/2019   Procedure: LEFT HEART CATH AND CORONARY ANGIOGRAPHY;  Surgeon: Jettie Booze, MD;  Location: [place] CV LAB;  Service: Cardiovascular;  Laterality: N/A;  . POLYPECTOMY    . REFRACTIVE SURGERY  10/2017  . TEE WITHOUT CARDIOVERSION N/A 09/14/2015   Procedure: TRANSESOPHAGEAL ECHOCARDIOGRAM (TEE);  Surgeon:  Larey Dresser, MD;  Location: [place];  Service: Cardiovascular;  Laterality: N/A;  . TRANSURETHRAL RESECTION OF PROSTATE N/A 10/20/2019   Procedure: TRANSURETHRAL RESECTION OF THE PROSTATE (TURP);  Surgeon: Irine Seal, MD;  [facility];  Service: Urology;  Laterality: N/A;  . UPPER GASTROINTESTINAL ENDOSCOPY      Family History  Problem Relation Age of Onset  . Hypertension Mother   . Hyperlipidemia Mother   . Hyperlipidemia Father   . Colon cancer Neg Hx   . Colon polyps Neg Hx   . Esophageal cancer Neg Hx   . Rectal cancer Neg Hx   . Stomach cancer Neg Hx     Social History   Socioeconomic History  . Marital status: Married    Spouse name: Not on file  . Number of children: Not on file  . Years of education: Not on file  . Highest education level: Not on file  Occupational History  . Not on file  Tobacco Use  . Smoking status: Former Research scientist (life sciences)  . Smokeless tobacco: Former Systems developer    Types: Chew    Quit date: 07/09/1978  . Tobacco comment: quit 1 year ago  Vaping Use  . Vaping Use: Never used  Substance and Sexual Activity  . Alcohol use: No    Alcohol/week: 1.0 standard drink    Types:  1 Cans of beer per week    Comment: quit last march/2017  . Drug use: No  . Sexual activity: Not on file  Other Topics Concern  . Not on file  Social History Narrative  . Not on file   Social Determinants of Health   Financial Resource Strain:   . Difficulty of Paying Living Expenses:   Food Insecurity:   . Worried About Charity fundraiser in the Last Year:   . Arboriculturist in the Last Year:   Transportation Needs:   . Film/video editor (Medical):   Marland Kitchen Lack of Transportation (Non-Medical):   Physical Activity:   . Days of Exercise per Week:   . Minutes of Exercise per Session:   Stress:   . Feeling of Stress :   Social Connections:   . Frequency of Communication with Friends and Family:   . Frequency of Social Gatherings with Friends and Family:   .  Attends Religious Services:   . Active Member of Clubs or Organizations:   . Attends Archivist Meetings:   Marland Kitchen Marital Status:   Intimate Partner Violence:   . Fear of Current or Ex-Partner:   . Emotionally Abused:   Marland Kitchen Physically Abused:   . Sexually Abused:     Outpatient Medications Prior to Visit  Medication Sig Dispense Refill  . Accu-Chek FastClix Lancets MISC Check blood sugar up to 7 times a week as instructed 102 each 3  . acetaminophen (TYLENOL) 325 MG tablet Take 1-2 tablets (325-650 mg total) by mouth every 4 (four) hours as needed for mild pain.    Marland Kitchen amLODipine (NORVASC) 10 MG tablet Take 1 tablet (10 mg total) by mouth daily. 30 tablet 0  . amLODipine (NORVASC) 5 MG tablet TAKE 1 TABLET BY MOUTH ONCE DAILY. 30 tablet 0  . aspirin EC 81 MG tablet Take 1 tablet (81 mg total) by mouth daily.    Marland Kitchen atorvastatin (LIPITOR) 80 MG tablet Take 1 tablet (80 mg total) by mouth at bedtime. IM program 90 tablet 3  . Blood Glucose Monitoring Suppl (ACCU-CHEK GUIDE) w/Device KIT 1 each by Does not apply route daily. Check blood sugar as instructed up to 7 times a week 1 kit 0  . carvedilol (COREG) 25 MG tablet Take 1 tablet (25 mg total) by mouth 2 (two) times daily with a meal. 180 tablet 1  . clopidogrel (PLAVIX) 75 MG tablet TAKE 1 TABLET BY MOUTH ONCE DAILY. 30 tablet 1  . dorzolamide-timolol (COSOPT) 22.3-6.8 MG/ML ophthalmic solution Place 1 drop into the right eye 2 (two) times daily. 10 mL 10  . finasteride (PROSCAR) 5 MG tablet Take 1 tablet (5 mg total) by mouth daily. 30 tablet 0  . furosemide (LASIX) 80 MG tablet TAKE 1 TABLET (80 MG TOTAL) BY MOUTH 2 (TWO) TIMES DAILY. 60 tablet 0  . glucose blood (ACCU-CHEK GUIDE) test strip Check blood sugar up to 7 times a week as instructed 100 each 3  . hydrALAZINE (APRESOLINE) 25 MG tablet TAKE 3 TABLETS (75 MG TOTAL) BY MOUTH 3 (THREE) TIMES DAILY. 270 tablet 0  . hydrALAZINE (APRESOLINE) 50 MG tablet Take 1 tablet (50 mg total)  by mouth every 8 (eight) hours. 90 tablet 0  . isosorbide mononitrate (IMDUR) 60 MG 24 hr tablet TAKE 1 TABLET (60 MG TOTAL) BY MOUTH DAILY. 30 tablet 0  . tamsulosin (FLOMAX) 0.4 MG CAPS capsule Take 1 capsule (0.4 mg total) by mouth daily after  supper. 30 capsule 0  . vitamin B-12 (CYANOCOBALAMIN) 1000 MCG tablet Take 1 tablet (1,000 mcg total) by mouth daily. 30 tablet 0   No facility-administered medications prior to visit.    Allergies  Allergen Reactions  . Cefepime Other (See Comments)    Pt had BAD encephalopathy from Cefepime    Review of Systems  Constitutional: Negative for chills, fever and unexpected weight change.  Respiratory: Negative for cough and shortness of breath.   Cardiovascular: Negative for chest pain, palpitations and leg swelling.  Gastrointestinal: Negative for nausea and vomiting.  Musculoskeletal: Negative for gait problem.  Neurological: Negative for syncope, weakness, light-headedness and headaches.       Objective:    Physical Exam Constitutional:      General: He is not in acute distress.    Appearance: Normal appearance.  Cardiovascular:     Rate and Rhythm: Normal rate and regular rhythm.  Pulmonary:     Effort: Pulmonary effort is normal.     Breath sounds: Normal breath sounds.  Abdominal:     General: There is no distension.     Palpations: Abdomen is soft.     Tenderness: There is no abdominal tenderness.  Skin:    Comments: Catheter in right chest wall without erythema, tenderness or drainage.  Immature AVF of RUE   Neurological:     General: No focal deficit present.     Mental Status: He is alert and oriented to person, place, and time.     BP (!) 103/50 (BP Location: Left Arm, Patient Position: Sitting, Cuff Size: Normal)   Pulse 66   Temp 99.7 F (37.6 C) (Oral)   Ht 6' (1.829 m)   Wt 158 lb (71.7 kg)   SpO2 100%   BMI 21.43 kg/m  Wt Readings from Last 3 Encounters:  02/01/20 158 lb (71.7 kg)  01/21/20 151 lb 14.4  oz (68.9 kg)  12/26/19 157 lb 3.2 oz (71.3 kg)    Health Maintenance Due  Topic Date Due  . COVID-19 Vaccine (1) Never done    There are no preventive care reminders to display for this patient.   Lab Results  Component Value Date   TSH 4.534 (H) 11/13/2019   Lab Results  Component Value Date   WBC 5.4 01/21/2020   HGB 8.0 (L) 01/21/2020   HCT 25.1 (L) 01/21/2020   MCV 92.3 01/21/2020   PLT 181 01/21/2020   Lab Results  Component Value Date   NA 136 01/21/2020   K 3.2 (L) 01/21/2020   CO2 26 01/21/2020   GLUCOSE 169 (H) 01/21/2020   BUN 21 01/21/2020   CREATININE 2.89 (H) 01/21/2020   BILITOT 0.6 01/13/2020   ALKPHOS 57 01/13/2020   AST 17 01/13/2020   ALT 19 01/13/2020   PROT 5.5 (L) 01/13/2020   ALBUMIN 2.0 (L) 01/21/2020   CALCIUM 7.7 (L) 01/21/2020   ANIONGAP 9 01/21/2020   GFR 86.27 11/05/2017   Lab Results  Component Value Date   CHOL 120 12/25/2019   Lab Results  Component Value Date   HDL 58 12/25/2019   Lab Results  Component Value Date   LDLCALC 48 12/25/2019   Lab Results  Component Value Date   TRIG 68 12/25/2019   Lab Results  Component Value Date   CHOLHDL 2.1 12/25/2019   Lab Results  Component Value Date   HGBA1C 5.9 (H) 11/14/2019       Assessment & Plan:   Problem List Items Addressed This  Visit      Genitourinary   ESRD (end stage renal disease) [facility])    Patient presents for hospital follow-up. He presented with evidence of progression from advanced CKD to ESRD and was agreeable to initiation of dialysis. He is accompanied by his son today and both indicate HD is going fairly well. He denies any acute concerns or complaints today.  His blood pressure is on the softer side, but he denies any symptoms. Will defer management of blood pressure medications to nephrology as they see fit.  He is euvolemic on exam today.           No orders of the defined types were placed in this encounter.    Delice Bison, DO

## 2020-02-02 NOTE — Progress Notes (Signed)
Internal Medicine Clinic Resident ° °I have personally reviewed this encounter including the documentation in this note and/or discussed this patient with the care management provider. I will address any urgent items identified by the care management provider and will communicate my actions to the patient's PCP. I have reviewed the patient's CCM visit with my supervising attending, Dr Hoffman. ° °Jazmine Heckman, MD  °[facility] PGY-2 °02/02/2020 ° ° ° °

## 2020-02-02 NOTE — Telephone Encounter (Signed)
I spoke with [facility] - Alleen Borne states the referral nurse did get Dr. Heber Advance referral and it will be reviewed as URGENT.

## 2020-02-03 DIAGNOSIS — T361X5D Adverse effect of cephalosporins and other beta-lactam antibiotics, subsequent encounter: Secondary | ICD-10-CM | POA: Diagnosis not present

## 2020-02-03 DIAGNOSIS — I5043 Acute on chronic combined systolic (congestive) and diastolic (congestive) heart failure: Secondary | ICD-10-CM | POA: Diagnosis not present

## 2020-02-03 DIAGNOSIS — D631 Anemia in chronic kidney disease: Secondary | ICD-10-CM | POA: Diagnosis not present

## 2020-02-03 DIAGNOSIS — I130 Hypertensive heart and chronic kidney disease with heart failure and stage 1 through stage 4 chronic kidney disease, or unspecified chronic kidney disease: Secondary | ICD-10-CM | POA: Diagnosis not present

## 2020-02-03 DIAGNOSIS — G92 Toxic encephalopathy: Secondary | ICD-10-CM | POA: Diagnosis not present

## 2020-02-03 DIAGNOSIS — I255 Ischemic cardiomyopathy: Secondary | ICD-10-CM | POA: Diagnosis not present

## 2020-02-03 DIAGNOSIS — I0981 Rheumatic heart failure: Secondary | ICD-10-CM | POA: Diagnosis not present

## 2020-02-03 DIAGNOSIS — E1122 Type 2 diabetes mellitus with diabetic chronic kidney disease: Secondary | ICD-10-CM | POA: Diagnosis not present

## 2020-02-03 DIAGNOSIS — N184 Chronic kidney disease, stage 4 (severe): Secondary | ICD-10-CM | POA: Diagnosis not present

## 2020-02-04 ENCOUNTER — Other Ambulatory Visit: Payer: Self-pay

## 2020-02-04 DIAGNOSIS — I998 Other disorder of circulatory system: Secondary | ICD-10-CM

## 2020-02-04 DIAGNOSIS — N186 End stage renal disease: Secondary | ICD-10-CM

## 2020-02-05 ENCOUNTER — Other Ambulatory Visit: Payer: Self-pay

## 2020-02-05 ENCOUNTER — Other Ambulatory Visit: Payer: Medicare PPO | Admitting: Internal Medicine

## 2020-02-05 DIAGNOSIS — D631 Anemia in chronic kidney disease: Secondary | ICD-10-CM | POA: Diagnosis not present

## 2020-02-05 DIAGNOSIS — I130 Hypertensive heart and chronic kidney disease with heart failure and stage 1 through stage 4 chronic kidney disease, or unspecified chronic kidney disease: Secondary | ICD-10-CM | POA: Diagnosis not present

## 2020-02-05 DIAGNOSIS — N184 Chronic kidney disease, stage 4 (severe): Secondary | ICD-10-CM | POA: Diagnosis not present

## 2020-02-05 DIAGNOSIS — I255 Ischemic cardiomyopathy: Secondary | ICD-10-CM | POA: Diagnosis not present

## 2020-02-05 DIAGNOSIS — I5043 Acute on chronic combined systolic (congestive) and diastolic (congestive) heart failure: Secondary | ICD-10-CM | POA: Diagnosis not present

## 2020-02-05 DIAGNOSIS — I0981 Rheumatic heart failure: Secondary | ICD-10-CM | POA: Diagnosis not present

## 2020-02-05 DIAGNOSIS — T361X5D Adverse effect of cephalosporins and other beta-lactam antibiotics, subsequent encounter: Secondary | ICD-10-CM | POA: Diagnosis not present

## 2020-02-05 DIAGNOSIS — E1122 Type 2 diabetes mellitus with diabetic chronic kidney disease: Secondary | ICD-10-CM | POA: Diagnosis not present

## 2020-02-05 DIAGNOSIS — G92 Toxic encephalopathy: Secondary | ICD-10-CM | POA: Diagnosis not present

## 2020-02-05 NOTE — Progress Notes (Signed)
Internal Medicine Clinic Attending  CCM services provided by the care management provider and their documentation were discussed with Dr. Bloomfield. We reviewed the pertinent findings, urgent action items addressed by the resident and non-urgent items to be addressed by the PCP.  I agree with the assessment, diagnosis, and plan of care documented in the CCM and resident's note.  Walaa Carel C Tiena Manansala, DO 02/05/2020  

## 2020-02-05 NOTE — Progress Notes (Signed)
Internal Medicine Clinic Attending  CCM services provided by the care management provider and their documentation were discussed with Dr. Aslam. We reviewed the pertinent findings, urgent action items addressed by the resident and non-urgent items to be addressed by the PCP.  I agree with the assessment, diagnosis, and plan of care documented in the CCM and resident's note.  Aran Menning C Ellionna Buckbee, DO 02/05/2020  

## 2020-02-08 ENCOUNTER — Other Ambulatory Visit: Payer: Self-pay

## 2020-02-08 ENCOUNTER — Other Ambulatory Visit (HOSPITAL_COMMUNITY)
Admission: RE | Admit: 2020-02-08 | Discharge: 2020-02-08 | Disposition: A | Discharge status: Home / Self Care | Payer: Medicare PPO | Source: Ambulatory Visit | Attending: Surgery | Admitting: Surgery

## 2020-02-08 ENCOUNTER — Encounter: Payer: Self-pay | Admitting: Surgery

## 2020-02-08 ENCOUNTER — Other Ambulatory Visit (HOSPITAL_COMMUNITY): Payer: Medicare PPO

## 2020-02-08 ENCOUNTER — Telehealth: Payer: Self-pay

## 2020-02-08 ENCOUNTER — Ambulatory Visit (HOSPITAL_COMMUNITY)
Admission: RE | Admit: 2020-02-08 | Discharge: 2020-02-08 | Disposition: A | Discharge status: Home / Self Care | Payer: Medicare PPO | Source: Ambulatory Visit | Attending: Surgery | Admitting: Surgery

## 2020-02-08 ENCOUNTER — Ambulatory Visit (INDEPENDENT_AMBULATORY_CARE_PROVIDER_SITE_OTHER): Payer: Medicare PPO | Admitting: Surgery

## 2020-02-08 VITALS — BP 126/68 | HR 62 | Temp 98.0°F | Resp 20 | Ht 72.0 in | Wt 157.0 lb

## 2020-02-08 DIAGNOSIS — I998 Other disorder of circulatory system: Secondary | ICD-10-CM

## 2020-02-08 DIAGNOSIS — I7025 Atherosclerosis of native arteries of other extremities with ulceration: Secondary | ICD-10-CM

## 2020-02-08 DIAGNOSIS — Z20822 Contact with and (suspected) exposure to covid-19: Secondary | ICD-10-CM | POA: Insufficient documentation

## 2020-02-08 DIAGNOSIS — Z01812 Encounter for preprocedural laboratory examination: Secondary | ICD-10-CM | POA: Insufficient documentation

## 2020-02-08 LAB — SARS CORONAVIRUS 2 (TAT 6-24 HRS): SARS Coronavirus 2: NEGATIVE

## 2020-02-08 MED ORDER — SODIUM CHLORIDE 0.9 % IV SOLN
250.0000 mL | INTRAVENOUS | Status: DC | PRN
Start: 2020-02-08 — End: 2020-02-21

## 2020-02-08 NOTE — H&P (View-Only) (Signed)
[facility]ist of [place]  Patient name: Dennis Macias MRN: [id number] DOB: May 06, 1951 Sex: male   REQUESTING PROVIDER:    Felizardo Hoffmann   REASON FOR CONSULT:    PAD  HISTORY OF PRESENT ILLNESS:   Dennis Macias is a 69 y.o. male, who is referred for evaluation of a left great toe wound.  Reportedly this happened approximately 1-2 weeks ago.  He was started on antibiotics and it is getting worse.  He does not have any significant pain here.  He denies any significant drainage.  The patient has a history of stroke but has no residual deficits.  He is now end-stage renal disease on dialysis Tuesday Thursday Saturday.  Dr. Donzetta Matters placed a right brachiocephalic fistula in early July which is yet to mature.  He suffers from diabetes which is diet controlled.  He takes a statin for hypercholesterolemia.  He has a history of congestive heart failure.  PAST MEDICAL HISTORY    Past Medical History:  Diagnosis Date  . Anemia   . Arthritis    past hx   . Blindness    right eye  . Cardiorenal syndrome   . Cataract    removed both eyes  . Dehydration   . Diabetes ([place])   . Glaucoma   . History of CVA (cerebrovascular accident) 09/13/2015  . History of stroke 09/13/2015  . History of urinary retention   . Hyperlipidemia   . Hypertension   . NSTEMI (non-ST elevated myocardial infarction) ([place])   . Pernicious anemia 02/24/2018  . S/P TURP   . Stroke [facility])    2017- March  . Syncope 11/2019  . Tachycardia 08/26/2017  . Tubular adenoma of colon 02/2017  . Weight loss, non-intentional 08/26/2017   10 lbs between 6/18 & 2/19     FAMILY HISTORY   Family History  Problem Relation Age of Onset  . Hypertension Mother   . Hyperlipidemia Mother   . Hyperlipidemia Father   . Colon cancer Neg Hx   . Colon polyps Neg Hx   . Esophageal cancer Neg Hx   . Rectal cancer Neg Hx   . Stomach cancer Neg Hx     SOCIAL HISTORY:   Social History    Socioeconomic History  . Marital status: Married    Spouse name: Not on file  . Number of children: Not on file  . Years of education: Not on file  . Highest education level: Not on file  Occupational History  . Not on file  Tobacco Use  . Smoking status: Former Research scientist (life sciences)  . Smokeless tobacco: Former Systems developer    Types: Chew    Quit date: 07/09/1978  . Tobacco comment: quit 1 year ago  Vaping Use  . Vaping Use: Never used  Substance and Sexual Activity  . Alcohol use: No    Alcohol/week: 1.0 standard drink    Types: 1 Cans of beer per week    Comment: quit last march/2017  . Drug use: No  . Sexual activity: Not on file  Other Topics Concern  . Not on file  Social History Narrative  . Not on file   Social Determinants of Health   Financial Resource Strain:   . Difficulty of Paying Living Expenses:   Food Insecurity:   . Worried About Charity fundraiser in the Last Year:   . Arboriculturist in the Last Year:   Transportation Needs:   . Film/video editor (Medical):   Marland Kitchen  Lack of Transportation (Non-Medical):   Physical Activity:   . Days of Exercise per Week:   . Minutes of Exercise per Session:   Stress:   . Feeling of Stress :   Social Connections:   . Frequency of Communication with Friends and Family:   . Frequency of Social Gatherings with Friends and Family:   . Attends Religious Services:   . Active Member of Clubs or Organizations:   . Attends Archivist Meetings:   Marland Kitchen Marital Status:   Intimate Partner Violence:   . Fear of Current or Ex-Partner:   . Emotionally Abused:   Marland Kitchen Physically Abused:   . Sexually Abused:     ALLERGIES:    Allergies  Allergen Reactions  . Cefepime Other (See Comments)    Pt had BAD encephalopathy from Cefepime    CURRENT MEDICATIONS:    Current Outpatient Medications  Medication Sig Dispense Refill  . Accu-Chek FastClix Lancets MISC Check blood sugar up to 7 times a week as instructed 102 each 3  .  acetaminophen (TYLENOL) 325 MG tablet Take 1-2 tablets (325-650 mg total) by mouth every 4 (four) hours as needed for mild pain.    Marland Kitchen amLODipine (NORVASC) 10 MG tablet Take 1 tablet (10 mg total) by mouth daily. 30 tablet 0  . amLODipine (NORVASC) 5 MG tablet TAKE 1 TABLET BY MOUTH ONCE DAILY. 30 tablet 0  . aspirin EC 81 MG tablet Take 1 tablet (81 mg total) by mouth daily.    Marland Kitchen atorvastatin (LIPITOR) 80 MG tablet Take 1 tablet (80 mg total) by mouth at bedtime. IM program 90 tablet 3  . Blood Glucose Monitoring Suppl (ACCU-CHEK GUIDE) w/Device KIT 1 each by Does not apply route daily. Check blood sugar as instructed up to 7 times a week 1 kit 0  . carvedilol (COREG) 25 MG tablet Take 1 tablet (25 mg total) by mouth 2 (two) times daily with a meal. 180 tablet 1  . ciprofloxacin (CIPRO) 250 MG tablet Take 500 mg by mouth every morning.    . clopidogrel (PLAVIX) 75 MG tablet TAKE 1 TABLET BY MOUTH ONCE DAILY. 30 tablet 1  . [facility] ACETAMINOPHEN 325 MG CAPS SMARTSIG:2 Capsule(s) By Mouth Every 4 Hours PRN    . dorzolamide-timolol (COSOPT) 22.3-6.8 MG/ML ophthalmic solution Place 1 drop into the right eye 2 (two) times daily. 10 mL 10  . doxycycline (VIBRAMYCIN) 100 MG capsule Take 1 capsule (100 mg total) by mouth 2 (two) times daily for 7 days. 14 capsule 0  . finasteride (PROSCAR) 5 MG tablet Take 1 tablet (5 mg total) by mouth daily. 30 tablet 0  . furosemide (LASIX) 80 MG tablet TAKE 1 TABLET (80 MG TOTAL) BY MOUTH 2 (TWO) TIMES DAILY. 60 tablet 0  . glucose blood (ACCU-CHEK GUIDE) test strip Check blood sugar up to 7 times a week as instructed 100 each 3  . hydrALAZINE (APRESOLINE) 25 MG tablet TAKE 3 TABLETS (75 MG TOTAL) BY MOUTH 3 (THREE) TIMES DAILY. 270 tablet 0  . hydrALAZINE (APRESOLINE) 50 MG tablet Take 1 tablet (50 mg total) by mouth every 8 (eight) hours. 90 tablet 0  . isosorbide mononitrate (IMDUR) 60 MG 24 hr tablet TAKE 1 TABLET (60 MG TOTAL) BY MOUTH DAILY. 30 tablet 0  . tamsulosin  (FLOMAX) 0.4 MG CAPS capsule Take 1 capsule (0.4 mg total) by mouth daily after supper. 30 capsule 0  . vitamin B-12 (CYANOCOBALAMIN) 1000 MCG tablet Take 1 tablet (1,000 mcg  total) by mouth daily. 30 tablet 0   No current facility-administered medications for this visit.    REVIEW OF SYSTEMS:   [X] denotes positive finding, [ ] denotes negative finding Cardiac  Comments:  Chest pain or chest pressure:    Shortness of breath upon exertion:    Short of breath when lying flat:    Irregular heart rhythm:        Vascular    Pain in calf, thigh, or hip brought on by ambulation:    Pain in feet at night that wakes you up from your sleep:     Blood clot in your veins:    Leg swelling:         Pulmonary    Oxygen at home:    Productive cough:     Wheezing:         Neurologic    Sudden weakness in arms or legs:     Sudden numbness in arms or legs:     Sudden onset of difficulty speaking or slurred speech:    Temporary loss of vision in one eye:     Problems with dizziness:         Gastrointestinal    Blood in stool:      Vomited blood:         Genitourinary    Burning when urinating:     Blood in urine:        Psychiatric    Major depression:         Hematologic    Bleeding problems:    Problems with blood clotting too easily:        Skin    Rashes or ulcers:        Constitutional    Fever or chills:     PHYSICAL EXAM:   Vitals:   02/08/20 1145  BP: 126/68  Pulse: 62  Resp: 20  Temp: 98 F (36.7 C)  SpO2: 98%  Weight: 157 lb (71.2 kg)  Height: 6' (1.829 m)    GENERAL: The patient is a well-nourished male, in no acute distress. The vital signs are documented above. CARDIAC: There is a regular rate and rhythm.  VASCULAR: Nonpalpable pedal pulses PULMONARY: Nonlabored respirations ABDOMEN: Soft and non-tender with normal pitched bowel sounds.  MUSCULOSKELETAL: There are no major deformities or cyanosis. NEUROLOGIC: No focal weakness or paresthesias are  detected. SKIN: See photo below  PSYCHIATRIC: The patient has a normal affect.    STUDIES:   I have reviewed the following:  Right: Resting right ankle-brachial index indicates noncompressible right  lower extremity arteries. The right toe-brachial index is abnormal.   Left: Resting left ankle-brachial index indicates moderate left lower  extremity arterial disease.  Absent PPG waveform of toe. ASSESSMENT and PLAN   Left great toe ulcer: This represents a limb threatening situation.  I recommended proceeding with angiography tomorrow.  This will be a right femoral access with bilateral lower extremity evaluation and intervention on the left if indicated.  I discussed the details of the procedure with the patient.  Tomorrow is a dialysis day and so we will have to get this rescheduled.   Leia Alf, MD, FACS [facility] of [facility] [phone] Pager [phone]

## 2020-02-08 NOTE — Progress Notes (Signed)
 Vascular and Vein Specialist of [place]  Patient name: Dennis Macias MRN: [id number] DOB: 06/13/1951 Sex: male   REQUESTING PROVIDER:    Jennifer Galloway   REASON FOR CONSULT:    PAD  HISTORY OF PRESENT ILLNESS:   Dennis Macias is a 68 y.o. male, who is referred for evaluation of a left great toe wound.  Reportedly this happened approximately 1-2 weeks ago.  He was started on antibiotics and it is getting worse.  He does not have any significant pain here.  He denies any significant drainage.  The patient has a history of stroke but has no residual deficits.  He is now end-stage renal disease on dialysis Tuesday Thursday Saturday.  Dr. Cain placed a right brachiocephalic fistula in early July which is yet to mature.  He suffers from diabetes which is diet controlled.  He takes a statin for hypercholesterolemia.  He has a history of congestive heart failure.  PAST MEDICAL HISTORY    Past Medical History:  Diagnosis Date  . Anemia   . Arthritis    past hx   . Blindness    right eye  . Cardiorenal syndrome   . Cataract    removed both eyes  . Dehydration   . Diabetes ([facility])   . Glaucoma   . History of CVA (cerebrovascular accident) 09/13/2015  . History of stroke 09/13/2015  . History of urinary retention   . Hyperlipidemia   . Hypertension   . NSTEMI (non-ST elevated myocardial infarction) ([facility])   . Pernicious anemia 02/24/2018  . S/P TURP   . Stroke ([facility])    2017- March  . Syncope 11/2019  . Tachycardia 08/26/2017  . Tubular adenoma of colon 02/2017  . Weight loss, non-intentional 08/26/2017   10 lbs between 6/18 & 2/19     FAMILY HISTORY   Family History  Problem Relation Age of Onset  . Hypertension Mother   . Hyperlipidemia Mother   . Hyperlipidemia Father   . Colon cancer Neg Hx   . Colon polyps Neg Hx   . Esophageal cancer Neg Hx   . Rectal cancer Neg Hx   . Stomach cancer Neg Hx     SOCIAL HISTORY:   Social History    Socioeconomic History  . Marital status: Married    Spouse name: Not on file  . Number of children: Not on file  . Years of education: Not on file  . Highest education level: Not on file  Occupational History  . Not on file  Tobacco Use  . Smoking status: Former Smoker  . Smokeless tobacco: Former User    Types: Chew    Quit date: 07/09/1978  . Tobacco comment: quit 1 year ago  Vaping Use  . Vaping Use: Never used  Substance and Sexual Activity  . Alcohol use: No    Alcohol/week: 1.0 standard drink    Types: 1 Cans of beer per week    Comment: quit last march/2017  . Drug use: No  . Sexual activity: Not on file  Other Topics Concern  . Not on file  Social History Narrative  . Not on file   Social Determinants of Health   Financial Resource Strain:   . Difficulty of Paying Living Expenses:   Food Insecurity:   . Worried About Running Out of Food in the Last Year:   . Ran Out of Food in the Last Year:   Transportation Needs:   . Lack of Transportation (Medical):   .   Lack of Transportation (Non-Medical):   Physical Activity:   . Days of Exercise per Week:   . Minutes of Exercise per Session:   Stress:   . Feeling of Stress :   Social Connections:   . Frequency of Communication with Friends and Family:   . Frequency of Social Gatherings with Friends and Family:   . Attends Religious Services:   . Active Member of Clubs or Organizations:   . Attends Club or Organization Meetings:   . Marital Status:   Intimate Partner Violence:   . Fear of Current or Ex-Partner:   . Emotionally Abused:   . Physically Abused:   . Sexually Abused:     ALLERGIES:    Allergies  Allergen Reactions  . Cefepime Other (See Comments)    Pt had BAD encephalopathy from Cefepime    CURRENT MEDICATIONS:    Current Outpatient Medications  Medication Sig Dispense Refill  . Accu-Chek FastClix Lancets MISC Check blood sugar up to 7 times a week as instructed 102 each 3  .  acetaminophen (TYLENOL) 325 MG tablet Take 1-2 tablets (325-650 mg total) by mouth every 4 (four) hours as needed for mild pain.    . amLODipine (NORVASC) 10 MG tablet Take 1 tablet (10 mg total) by mouth daily. 30 tablet 0  . amLODipine (NORVASC) 5 MG tablet TAKE 1 TABLET BY MOUTH ONCE DAILY. 30 tablet 0  . aspirin EC 81 MG tablet Take 1 tablet (81 mg total) by mouth daily.    . atorvastatin (LIPITOR) 80 MG tablet Take 1 tablet (80 mg total) by mouth at bedtime. IM program 90 tablet 3  . Blood Glucose Monitoring Suppl (ACCU-CHEK GUIDE) w/Device KIT 1 each by Does not apply route daily. Check blood sugar as instructed up to 7 times a week 1 kit 0  . carvedilol (COREG) 25 MG tablet Take 1 tablet (25 mg total) by mouth 2 (two) times daily with a meal. 180 tablet 1  . ciprofloxacin (CIPRO) 250 MG tablet Take 500 mg by mouth every morning.    . clopidogrel (PLAVIX) 75 MG tablet TAKE 1 TABLET BY MOUTH ONCE DAILY. 30 tablet 1  . [facility] ACETAMINOPHEN 325 MG CAPS SMARTSIG:2 Capsule(s) By Mouth Every 4 Hours PRN    . dorzolamide-timolol (COSOPT) 22.3-6.8 MG/ML ophthalmic solution Place 1 drop into the right eye 2 (two) times daily. 10 mL 10  . doxycycline (VIBRAMYCIN) 100 MG capsule Take 1 capsule (100 mg total) by mouth 2 (two) times daily for 7 days. 14 capsule 0  . finasteride (PROSCAR) 5 MG tablet Take 1 tablet (5 mg total) by mouth daily. 30 tablet 0  . furosemide (LASIX) 80 MG tablet TAKE 1 TABLET (80 MG TOTAL) BY MOUTH 2 (TWO) TIMES DAILY. 60 tablet 0  . glucose blood (ACCU-CHEK GUIDE) test strip Check blood sugar up to 7 times a week as instructed 100 each 3  . hydrALAZINE (APRESOLINE) 25 MG tablet TAKE 3 TABLETS (75 MG TOTAL) BY MOUTH 3 (THREE) TIMES DAILY. 270 tablet 0  . hydrALAZINE (APRESOLINE) 50 MG tablet Take 1 tablet (50 mg total) by mouth every 8 (eight) hours. 90 tablet 0  . isosorbide mononitrate (IMDUR) 60 MG 24 hr tablet TAKE 1 TABLET (60 MG TOTAL) BY MOUTH DAILY. 30 tablet 0  . tamsulosin  (FLOMAX) 0.4 MG CAPS capsule Take 1 capsule (0.4 mg total) by mouth daily after supper. 30 capsule 0  . vitamin B-12 (CYANOCOBALAMIN) 1000 MCG tablet Take 1 tablet (1,000 mcg   total) by mouth daily. 30 tablet 0   No current facility-administered medications for this visit.    REVIEW OF SYSTEMS:   [X] denotes positive finding, [ ] denotes negative finding Cardiac  Comments:  Chest pain or chest pressure:    Shortness of breath upon exertion:    Short of breath when lying flat:    Irregular heart rhythm:        Vascular    Pain in calf, thigh, or hip brought on by ambulation:    Pain in feet at night that wakes you up from your sleep:     Blood clot in your veins:    Leg swelling:         Pulmonary    Oxygen at home:    Productive cough:     Wheezing:         Neurologic    Sudden weakness in arms or legs:     Sudden numbness in arms or legs:     Sudden onset of difficulty speaking or slurred speech:    Temporary loss of vision in one eye:     Problems with dizziness:         Gastrointestinal    Blood in stool:      Vomited blood:         Genitourinary    Burning when urinating:     Blood in urine:        Psychiatric    Major depression:         Hematologic    Bleeding problems:    Problems with blood clotting too easily:        Skin    Rashes or ulcers:        Constitutional    Fever or chills:     PHYSICAL EXAM:   Vitals:   02/08/20 1145  BP: 126/68  Pulse: 62  Resp: 20  Temp: 98 F (36.7 C)  SpO2: 98%  Weight: 157 lb (71.2 kg)  Height: 6' (1.829 m)    GENERAL: The patient is a well-nourished male, in no acute distress. The vital signs are documented above. CARDIAC: There is a regular rate and rhythm.  VASCULAR: Nonpalpable pedal pulses PULMONARY: Nonlabored respirations ABDOMEN: Soft and non-tender with normal pitched bowel sounds.  MUSCULOSKELETAL: There are no major deformities or cyanosis. NEUROLOGIC: No focal weakness or paresthesias are  detected. SKIN: See photo below  PSYCHIATRIC: The patient has a normal affect.    STUDIES:   I have reviewed the following:  Right: Resting right ankle-brachial index indicates noncompressible right  lower extremity arteries. The right toe-brachial index is abnormal.   Left: Resting left ankle-brachial index indicates moderate left lower  extremity arterial disease.  Absent PPG waveform of toe. ASSESSMENT and PLAN   Left great toe ulcer: This represents a limb threatening situation.  I recommended proceeding with angiography tomorrow.  This will be a right femoral access with bilateral lower extremity evaluation and intervention on the left if indicated.  I discussed the details of the procedure with the patient.  Tomorrow is a dialysis day and so we will have to get this rescheduled.   Wells Brabham, IV, MD, FACS [facility] [place] Tel [phone] Pager [phone] 

## 2020-02-08 NOTE — Telephone Encounter (Signed)
Spoke with Joycelyn Schmid, nurse at [facility] regarding moving pt's dialysis day to allow procedure on 02/09/20 with Dr. Trula Slade. She agreed and advised to instruct pt to arrive on 02/10/20 at 1200PM for a 1215PM slot. Pt's significant other [facility] informed and verbalized understanding.

## 2020-02-09 ENCOUNTER — Ambulatory Visit (HOSPITAL_COMMUNITY)
Admission: RE | Admit: 2020-02-09 | Discharge: 2020-02-09 | Disposition: A | Discharge status: Home / Self Care | Payer: Medicare PPO | Source: Ambulatory Visit | Attending: Surgery | Admitting: Surgery

## 2020-02-09 ENCOUNTER — Encounter (HOSPITAL_COMMUNITY)
Admission: RE | Disposition: A | Discharge status: Home / Self Care | Payer: Self-pay | Source: Ambulatory Visit | Attending: Surgery

## 2020-02-09 ENCOUNTER — Other Ambulatory Visit: Payer: Self-pay

## 2020-02-09 DIAGNOSIS — E1151 Type 2 diabetes mellitus with diabetic peripheral angiopathy without gangrene: Secondary | ICD-10-CM | POA: Insufficient documentation

## 2020-02-09 DIAGNOSIS — I509 Heart failure, unspecified: Secondary | ICD-10-CM | POA: Diagnosis not present

## 2020-02-09 DIAGNOSIS — Z8673 Personal history of transient ischemic attack (TIA), and cerebral infarction without residual deficits: Secondary | ICD-10-CM | POA: Insufficient documentation

## 2020-02-09 DIAGNOSIS — Z992 Dependence on renal dialysis: Secondary | ICD-10-CM | POA: Diagnosis not present

## 2020-02-09 DIAGNOSIS — E1122 Type 2 diabetes mellitus with diabetic chronic kidney disease: Secondary | ICD-10-CM | POA: Diagnosis not present

## 2020-02-09 DIAGNOSIS — I70245 Atherosclerosis of native arteries of left leg with ulceration of other part of foot: Secondary | ICD-10-CM

## 2020-02-09 DIAGNOSIS — Z7982 Long term (current) use of aspirin: Secondary | ICD-10-CM | POA: Diagnosis not present

## 2020-02-09 DIAGNOSIS — I132 Hypertensive heart and chronic kidney disease with heart failure and with stage 5 chronic kidney disease, or end stage renal disease: Secondary | ICD-10-CM | POA: Insufficient documentation

## 2020-02-09 DIAGNOSIS — E11621 Type 2 diabetes mellitus with foot ulcer: Secondary | ICD-10-CM | POA: Insufficient documentation

## 2020-02-09 DIAGNOSIS — L97529 Non-pressure chronic ulcer of other part of left foot with unspecified severity: Secondary | ICD-10-CM | POA: Insufficient documentation

## 2020-02-09 DIAGNOSIS — N186 End stage renal disease: Secondary | ICD-10-CM | POA: Insufficient documentation

## 2020-02-09 DIAGNOSIS — Z87891 Personal history of nicotine dependence: Secondary | ICD-10-CM | POA: Diagnosis not present

## 2020-02-09 DIAGNOSIS — E785 Hyperlipidemia, unspecified: Secondary | ICD-10-CM | POA: Diagnosis not present

## 2020-02-09 DIAGNOSIS — Z79899 Other long term (current) drug therapy: Secondary | ICD-10-CM | POA: Diagnosis not present

## 2020-02-09 DIAGNOSIS — Z7902 Long term (current) use of antithrombotics/antiplatelets: Secondary | ICD-10-CM | POA: Diagnosis not present

## 2020-02-09 DIAGNOSIS — I252 Old myocardial infarction: Secondary | ICD-10-CM | POA: Insufficient documentation

## 2020-02-09 HISTORY — PX: PERIPHERAL VASCULAR INTERVENTION: CATH118257

## 2020-02-09 HISTORY — PX: ABDOMINAL AORTOGRAM W/LOWER EXTREMITY: CATH118223

## 2020-02-09 LAB — POCT ACTIVATED CLOTTING TIME
Activated Clotting Time: 191 seconds
Activated Clotting Time: 202 seconds
Activated Clotting Time: 208 seconds

## 2020-02-09 SURGERY — ABDOMINAL AORTOGRAM W/LOWER EXTREMITY
Anesthesia: LOCAL

## 2020-02-09 MED ORDER — LIDOCAINE HCL (PF) 1 % IJ SOLN
INTRAMUSCULAR | Status: AC
  Filled 2020-02-09: qty 30

## 2020-02-09 MED ORDER — LIDOCAINE HCL (PF) 1 % IJ SOLN
INTRAMUSCULAR | Status: DC | PRN
  Administered 2020-02-09: 15 mL via INTRADERMAL

## 2020-02-09 MED ORDER — VIPERSLIDE LUBRICANT OPTIME: TOPICAL | Status: DC | PRN

## 2020-02-09 MED ORDER — LABETALOL HCL 5 MG/ML IV SOLN: 10.0000 mg | INTRAVENOUS | Status: DC | PRN

## 2020-02-09 MED ORDER — SODIUM CHLORIDE 0.9% FLUSH: 3.0000 mL | INTRAVENOUS | Status: DC | PRN

## 2020-02-09 MED ORDER — HEPARIN (PORCINE) IN NACL 1000-0.9 UT/500ML-% IV SOLN
INTRAVENOUS | Status: AC
  Filled 2020-02-09: qty 1000

## 2020-02-09 MED ORDER — FENTANYL CITRATE (PF) 100 MCG/2ML IJ SOLN
INTRAMUSCULAR | Status: DC | PRN
  Administered 2020-02-09 (×2): 50 ug via INTRAVENOUS

## 2020-02-09 MED ORDER — FENTANYL CITRATE (PF) 100 MCG/2ML IJ SOLN
INTRAMUSCULAR | Status: AC
  Filled 2020-02-09: qty 2

## 2020-02-09 MED ORDER — MIDAZOLAM HCL 2 MG/2ML IJ SOLN
INTRAMUSCULAR | Status: DC | PRN
  Administered 2020-02-09: 2 mg via INTRAVENOUS
  Administered 2020-02-09: 1 mg via INTRAVENOUS

## 2020-02-09 MED ORDER — HEPARIN (PORCINE) IN NACL 1000-0.9 UT/500ML-% IV SOLN
INTRAVENOUS | Status: DC | PRN
  Administered 2020-02-09 (×2): 500 mL

## 2020-02-09 MED ORDER — MIDAZOLAM HCL 2 MG/2ML IJ SOLN
INTRAMUSCULAR | Status: AC
  Filled 2020-02-09: qty 2

## 2020-02-09 MED ORDER — IODIXANOL 320 MG/ML IV SOLN
INTRAVENOUS | Status: DC | PRN
  Administered 2020-02-09: 200 mL

## 2020-02-09 MED ORDER — HYDRALAZINE HCL 20 MG/ML IJ SOLN: 5.0000 mg | INTRAMUSCULAR | Status: DC | PRN

## 2020-02-09 MED ORDER — ONDANSETRON HCL 4 MG/2ML IJ SOLN: 4.0000 mg | Freq: Four times a day (QID) | INTRAMUSCULAR | Status: DC | PRN

## 2020-02-09 MED ORDER — MORPHINE SULFATE (PF) 2 MG/ML IV SOLN: 2.0000 mg | INTRAVENOUS | Status: DC | PRN

## 2020-02-09 MED ORDER — SODIUM CHLORIDE 0.9% FLUSH: 3.0000 mL | Freq: Two times a day (BID) | INTRAVENOUS | Status: DC

## 2020-02-09 MED ORDER — HEPARIN SODIUM (PORCINE) 1000 UNIT/ML IJ SOLN
INTRAMUSCULAR | Status: AC
  Filled 2020-02-09: qty 1

## 2020-02-09 MED ORDER — VERAPAMIL HCL 2.5 MG/ML IV SOLN
INTRAVENOUS | Status: AC
  Filled 2020-02-09: qty 2

## 2020-02-09 MED ORDER — OXYCODONE HCL 5 MG PO TABS: 5.0000 mg | ORAL_TABLET | ORAL | Status: DC | PRN

## 2020-02-09 MED ORDER — NITROGLYCERIN IN D5W 200-5 MCG/ML-% IV SOLN
INTRAVENOUS | Status: AC
  Filled 2020-02-09: qty 250

## 2020-02-09 MED ORDER — ACETAMINOPHEN 325 MG PO TABS: 650.0000 mg | ORAL_TABLET | ORAL | Status: DC | PRN

## 2020-02-09 MED ORDER — SODIUM CHLORIDE 0.9 % IV SOLN: 250.0000 mL | INTRAVENOUS | Status: DC | PRN

## 2020-02-09 MED ORDER — HEPARIN SODIUM (PORCINE) 1000 UNIT/ML IJ SOLN
INTRAMUSCULAR | Status: DC | PRN
  Administered 2020-02-09: 7500 [IU] via INTRAVENOUS
  Administered 2020-02-09: 1000 [IU] via INTRAVENOUS
  Administered 2020-02-09: 2000 [IU] via INTRAVENOUS
  Administered 2020-02-09: 3000 [IU] via INTRAVENOUS

## 2020-02-09 SURGICAL SUPPLY — 37 items
BAG SNAP BAND KOVER 36X36 (MISCELLANEOUS) ×3 IMPLANT
BALLN JADE .014 1.5 X 40 (BALLOONS) ×3
BALLN JADE .014 2.0 X 150 (BALLOONS) ×3
BALLN JADE .014 2.0 X 20 (BALLOONS) ×3
BALLN JADE .014 3.0  X 240 (BALLOONS) ×3
BALLN JADE .014 3.0 X 240 (BALLOONS) ×2
BALLN JADE .018 2.0 X 60 (BALLOONS) ×3
BALLOON JADE .014 1.5 X 40 (BALLOONS) ×2 IMPLANT
BALLOON JADE .014 2.0 X 150 (BALLOONS) ×2 IMPLANT
BALLOON JADE .014 2.0 X 20 (BALLOONS) ×2 IMPLANT
BALLOON JADE .014 3.0 X 240 (BALLOONS) ×2 IMPLANT
BALLOON JADE .018 2.0 X 60 (BALLOONS) ×2 IMPLANT
CATH OMNI FLUSH 5F 65CM (CATHETERS) ×3 IMPLANT
CATH QUICKCROSS .018X135CM (MICROCATHETER) ×3 IMPLANT
CATH QUICKCROSS ANG SELECT (CATHETERS) ×3 IMPLANT
COVER DOME SNAP 22 D (MISCELLANEOUS) ×3 IMPLANT
CROWN STEALTH MICRO-30 1.25MM (CATHETERS) ×3 IMPLANT
DEVICE CLOSURE MYNXGRIP 6/7F (Vascular Products) ×3 IMPLANT
GUIDEWIRE ZILIENT 30G 014 (WIRE) ×3 IMPLANT
KIT ENCORE 26 ADVANTAGE (KITS) ×3 IMPLANT
KIT MICROPUNCTURE NIT STIFF (SHEATH) ×3 IMPLANT
KIT PV (KITS) ×3 IMPLANT
LUBRICANT VIPERSLIDE CORONARY (MISCELLANEOUS) ×3 IMPLANT
PROTECTION STATION PRESSURIZED (MISCELLANEOUS) ×3
SHEATH PINNACLE 5F 10CM (SHEATH) ×3 IMPLANT
SHEATH PINNACLE 6F 10CM (SHEATH) ×3 IMPLANT
SHEATH PINNACLE ST 6F 65CM (SHEATH) ×3 IMPLANT
SHIELD RADPAD SCOOP 12X17 (MISCELLANEOUS) ×3 IMPLANT
STATION PROTECTION PRESSURIZED (MISCELLANEOUS) ×2 IMPLANT
SYR MEDRAD MARK V 150ML (SYRINGE) ×3 IMPLANT
TRANSDUCER W/STOPCOCK (MISCELLANEOUS) ×3 IMPLANT
TRAY PV CATH (CUSTOM PROCEDURE TRAY) ×3 IMPLANT
TUBE CONN 8.8X1320 FR HP M-F (CONNECTOR) ×3 IMPLANT
WIRE BENTSON .035X145CM (WIRE) ×3 IMPLANT
WIRE G V18X300CM (WIRE) ×6 IMPLANT
WIRE VIPER WIRECTO 0.014 (WIRE) ×3 IMPLANT
WIRE ZILIENT 014 4G (WIRE) ×3 IMPLANT

## 2020-02-09 NOTE — Op Note (Signed)
Patient name: Dennis Macias MRN: [id number] DOB: 1950-10-21 Sex: male  02/09/2020 Pre-operative Diagnosis: Left toe ulcer Post-operative diagnosis:  Same Surgeon:  [name] Procedure Performed:  1.  Ultrasound-guided access, right femoral artery  2.  Abdominal aortogram  3.  Bilateral lower extremity runoff  4.  Atherectomy, left posterior tibial artery  5.  Balloon angioplasty of the left posterior tibial artery  6.  Intra-arterial administration of nitroglycerin  7.  Conscious sedation, 158 minutes   Indications: Patient presented with a left great toe ulcer yesterday in the office.  Duplex showed severe tibial disease.  He comes in today for further evaluation and possible intervention  Procedure:  The patient was identified in the holding area and taken to room 8.  The patient was then placed supine on the table and prepped and draped in the usual sterile fashion.  A time out was called.  Conscious sedation was administered with the use of IV fentanyl and Versed under continuous physician and nurse monitoring.  Heart rate, blood pressure, and oxygen saturation were continuously monitored.  Total sedation time was 156 minutes.  Ultrasound was used to evaluate the right common femoral artery.  It was patent .  A digital ultrasound image was acquired.  A micropuncture needle was used to access the right common femoral artery under ultrasound guidance.  An 018 wire was advanced without resistance and a micropuncture sheath was placed.  The 018 wire was removed and a benson wire was placed.  The micropuncture sheath was exchanged for a 5 french sheath.  An omniflush catheter was advanced over the wire to the level of L-1.  An abdominal angiogram was obtained.  Next, using the omniflush catheter was pulled out of aorta bifurcation bilateral runoff was performed.  Findings:   Aortogram: No significant renal artery stenosis.  Infrarenal abdominal aorta is widely patent without stenosis.   Bilateral common and external iliac arteries widely patent.  Right Lower Extremity: Right common femoral profundofemoral superficial femoral, popliteal artery are widely patent.  There is diffuse tibial disease.  All 3 tibial vessels are occluded.  The posterior tibial artery does reconstitute across the ankle but is very diseased out onto the foot  Left Lower Extremity: Left common femoral profundofemoral superficial femoral, popliteal artery are widely patent.  There is diffuse tibial disease.  All 3 tibial vessels are occluded.  The posterior tibial artery does reconstitute across the ankle but is very diseased out onto the foot  Intervention: After the above images were obtained the decision made to proceed with intervention.  The aortic bifurcation was crossed with an Omni Flush catheter and a Bentson wire.  I then inserted a 7 x 65 sheath into the left superficial femoral artery.  The patient was fully heparinized.  I then used a angled quick cross cath and a V 18 wire to select the posterior tibial artery.  I had difficulty advancing the wire and tried multiple strategies.  I switched out to a standard quick cross and then used a balloon and ultimately I changed out to an 014 system and was able to advance the wire down to the ankle however I could not living get a 1.5 mm balloon to the ankle.  I then used a balloon catheter in the mid posterior tibial artery to exchanged for a Viper wire which went down to the ankle.  I then selected the CSI microdevice and perform atherectomy of the posterior tibial artery on low speed.  I was  able to perform atherectomy all the way down.  We did not increase the speed due to the poor outflow.  Once I had made a successful pass all the way down I proceeded with balloon angioplasty.  I first used a 2 mm balloon and then upsized to a 3 x 240 balloon.  This was 2 minutes.  Completion imaging showed that there was minimal filling of the posterior tibial artery which I  suspected was from outflow issues.  I then removed the Viper wire and placed a 014 wire which I was able to get down to the ankle.  I uncovered a stenosis at the level of the ankle which ultimately was treated with a 1.5 mm balloon.  This was completed, there did appear to be inline flow now down across the ankle.  I did not feel any other intervention would be beneficial at this time and he now had good opacification of the posterior tibial artery down all the way across the ankle of the foot.  Catheters and wires were removed.  A minx was used for closure.  Impression:  #1 diffuse bilateral tibial disease with the posterior tibial artery potential target for revascularization.  No significant outflow or inflow  #2 successful atherectomy and left posterior tibial artery 5 micro device 3 mm balloon.   Theotis Burrow, M.D., [facility] of [place] Office: [phone] Pager:  [phone]

## 2020-02-09 NOTE — Discharge Instructions (Signed)
Femoral Site Care This sheet gives you information about how to care for yourself after your procedure. Your health care provider may also give you more specific instructions. If you have problems or questions, contact your health care provider. What can I expect after the procedure? After the procedure, it is common to have:  Bruising that usually fades within 1-2 weeks.  Tenderness at the site. Follow these instructions at home: Wound care  Follow instructions from your health care provider about how to take care of your insertion site. Make sure you: ? Wash your hands with soap and water before you change your bandage (dressing). If soap and water are not available, use hand sanitizer. ? Change your dressing as told by your health care provider. ? Leave stitches (sutures), skin glue, or adhesive strips in place. These skin closures may need to stay in place for 2 weeks or longer. If adhesive strip edges start to loosen and curl up, you may trim the loose edges. Do not remove adhesive strips completely unless your health care provider tells you to do that.  Do not take baths, swim, or use a hot tub until your health care provider approves.  You may shower 24-48 hours after the procedure or as told by your health care provider. ? Gently wash the site with plain soap and water. ? Pat the area dry with a clean towel. ? Do not rub the site. This may cause bleeding.  Do not apply powder or lotion to the site. Keep the site clean and dry.  Check your femoral site every day for signs of infection. Check for: ? Redness, swelling, or pain. ? Fluid or blood. ? Warmth. ? Pus or a bad smell. Activity  For the first 2-3 days after your procedure, or as long as directed: ? Avoid climbing stairs as much as possible. ? Do not squat.  Do not lift anything that is heavier than 10 lb (4.5 kg), or the limit that you are told, until your health care provider says that it is safe.  Rest as  directed. ? Avoid sitting for a long time without moving. Get up to take short walks every 1-2 hours.  Do not drive for 24 hours if you were given a medicine to help you relax (sedative). General instructions  Take over-the-counter and prescription medicines only as told by your health care provider.  Keep all follow-up visits as told by your health care provider. This is important. Contact a health care provider if you have:  A fever or chills.  You have redness, swelling, or pain around your insertion site. Get help right away if:  The catheter insertion area swells very fast.  You pass out.  You suddenly start to sweat or your skin gets clammy.  The catheter insertion area is bleeding, and the bleeding does not stop when you hold steady pressure on the area.  The area near or just beyond the catheter insertion site becomes pale, cool, tingly, or numb. These symptoms may represent a serious problem that is an emergency. Do not wait to see if the symptoms will go away. Get medical help right away. Call your local emergency services (911 in the [place] Do not drive yourself to the hospital. Summary  After the procedure, it is common to have bruising that usually fades within 1-2 weeks.  Check your femoral site every day for signs of infection.  Do not lift anything that is heavier than 10 lb (4.5 kg), or the   limit that you are told, until your health care provider says that it is safe. This information is not intended to replace advice given to you by your health care provider. Make sure you discuss any questions you have with your health care provider. Document Revised: 07/08/2017 Document Reviewed: 07/08/2017 Elsevier Patient Education  2020 [employer]  

## 2020-02-09 NOTE — Progress Notes (Signed)
Patient and wife was given discharge instructions. Both verbalized understanding. 

## 2020-02-10 ENCOUNTER — Encounter (HOSPITAL_COMMUNITY): Payer: Self-pay | Admitting: Surgery

## 2020-02-10 LAB — POCT I-STAT, CHEM 8
BUN: 34 mg/dL — ABNORMAL HIGH (ref 8–23)
Calcium, Ion: 1.13 mmol/L — ABNORMAL LOW (ref 1.15–1.40)
Chloride: 98 mmol/L (ref 98–111)
Creatinine, Ser: 4.3 mg/dL — ABNORMAL HIGH (ref 0.61–1.24)
Glucose, Bld: 129 mg/dL — ABNORMAL HIGH (ref 70–99)
HCT: 22 % — ABNORMAL LOW (ref 39.0–52.0)
Hemoglobin: 7.5 g/dL — ABNORMAL LOW (ref 13.0–17.0)
Potassium: 4.3 mmol/L (ref 3.5–5.1)
Sodium: 138 mmol/L (ref 135–145)
TCO2: 26 mmol/L (ref 22–32)

## 2020-02-10 NOTE — Interval H&P Note (Signed)
History and Physical Interval Note:  02/10/2020 4:43 PM  Dennis Macias  has presented today for surgery, with the diagnosis of pad.  The various methods of treatment have been discussed with the patient and family. After consideration of risks, benefits and other options for treatment, the patient has consented to  Procedure(s): ABDOMINAL AORTOGRAM W/LOWER EXTREMITY (N/A) PERIPHERAL VASCULAR INTERVENTION (Left) as a surgical intervention.  The patient's history has been reviewed, patient examined, no change in status, stable for surgery.  I have reviewed the patient's chart and labs.  Questions were answered to the patient's satisfaction.     [name]

## 2020-02-12 DIAGNOSIS — D631 Anemia in chronic kidney disease: Secondary | ICD-10-CM | POA: Diagnosis not present

## 2020-02-12 DIAGNOSIS — I5043 Acute on chronic combined systolic (congestive) and diastolic (congestive) heart failure: Secondary | ICD-10-CM | POA: Diagnosis not present

## 2020-02-12 DIAGNOSIS — T361X5D Adverse effect of cephalosporins and other beta-lactam antibiotics, subsequent encounter: Secondary | ICD-10-CM | POA: Diagnosis not present

## 2020-02-12 DIAGNOSIS — I130 Hypertensive heart and chronic kidney disease with heart failure and stage 1 through stage 4 chronic kidney disease, or unspecified chronic kidney disease: Secondary | ICD-10-CM | POA: Diagnosis not present

## 2020-02-12 DIAGNOSIS — I0981 Rheumatic heart failure: Secondary | ICD-10-CM | POA: Diagnosis not present

## 2020-02-12 DIAGNOSIS — N184 Chronic kidney disease, stage 4 (severe): Secondary | ICD-10-CM | POA: Diagnosis not present

## 2020-02-12 DIAGNOSIS — E1122 Type 2 diabetes mellitus with diabetic chronic kidney disease: Secondary | ICD-10-CM | POA: Diagnosis not present

## 2020-02-12 DIAGNOSIS — G92 Toxic encephalopathy: Secondary | ICD-10-CM | POA: Diagnosis not present

## 2020-02-12 DIAGNOSIS — I255 Ischemic cardiomyopathy: Secondary | ICD-10-CM | POA: Diagnosis not present

## 2020-02-16 DIAGNOSIS — I452 Bifascicular block: Secondary | ICD-10-CM | POA: Diagnosis present

## 2020-02-16 DIAGNOSIS — I132 Hypertensive heart and chronic kidney disease with heart failure and with stage 5 chronic kidney disease, or end stage renal disease: Secondary | ICD-10-CM | POA: Diagnosis present

## 2020-02-16 DIAGNOSIS — E1122 Type 2 diabetes mellitus with diabetic chronic kidney disease: Secondary | ICD-10-CM | POA: Diagnosis present

## 2020-02-16 DIAGNOSIS — N400 Enlarged prostate without lower urinary tract symptoms: Secondary | ICD-10-CM | POA: Diagnosis present

## 2020-02-16 DIAGNOSIS — N186 End stage renal disease: Secondary | ICD-10-CM | POA: Diagnosis present

## 2020-02-16 DIAGNOSIS — Z79899 Other long term (current) drug therapy: Secondary | ICD-10-CM

## 2020-02-16 DIAGNOSIS — E785 Hyperlipidemia, unspecified: Secondary | ICD-10-CM | POA: Diagnosis present

## 2020-02-16 DIAGNOSIS — Z7982 Long term (current) use of aspirin: Secondary | ICD-10-CM

## 2020-02-16 DIAGNOSIS — I429 Cardiomyopathy, unspecified: Secondary | ICD-10-CM | POA: Diagnosis present

## 2020-02-16 DIAGNOSIS — Z8673 Personal history of transient ischemic attack (TIA), and cerebral infarction without residual deficits: Secondary | ICD-10-CM

## 2020-02-16 DIAGNOSIS — G8191 Hemiplegia, unspecified affecting right dominant side: Secondary | ICD-10-CM | POA: Diagnosis present

## 2020-02-16 DIAGNOSIS — I6389 Other cerebral infarction: Principal | ICD-10-CM | POA: Diagnosis present

## 2020-02-16 DIAGNOSIS — I440 Atrioventricular block, first degree: Secondary | ICD-10-CM | POA: Diagnosis present

## 2020-02-16 DIAGNOSIS — I951 Orthostatic hypotension: Secondary | ICD-10-CM | POA: Diagnosis present

## 2020-02-16 DIAGNOSIS — Z20822 Contact with and (suspected) exposure to covid-19: Secondary | ICD-10-CM | POA: Diagnosis present

## 2020-02-16 DIAGNOSIS — I252 Old myocardial infarction: Secondary | ICD-10-CM

## 2020-02-16 DIAGNOSIS — I2510 Atherosclerotic heart disease of native coronary artery without angina pectoris: Secondary | ICD-10-CM | POA: Diagnosis present

## 2020-02-16 DIAGNOSIS — H5461 Unqualified visual loss, right eye, normal vision left eye: Secondary | ICD-10-CM | POA: Diagnosis present

## 2020-02-16 DIAGNOSIS — D631 Anemia in chronic kidney disease: Secondary | ICD-10-CM | POA: Diagnosis present

## 2020-02-16 DIAGNOSIS — N2581 Secondary hyperparathyroidism of renal origin: Secondary | ICD-10-CM | POA: Diagnosis present

## 2020-02-16 DIAGNOSIS — I953 Hypotension of hemodialysis: Secondary | ICD-10-CM | POA: Diagnosis present

## 2020-02-16 DIAGNOSIS — L97529 Non-pressure chronic ulcer of other part of left foot with unspecified severity: Secondary | ICD-10-CM | POA: Diagnosis present

## 2020-02-16 DIAGNOSIS — D510 Vitamin B12 deficiency anemia due to intrinsic factor deficiency: Secondary | ICD-10-CM | POA: Diagnosis present

## 2020-02-16 DIAGNOSIS — I639 Cerebral infarction, unspecified: Secondary | ICD-10-CM | POA: Diagnosis not present

## 2020-02-16 DIAGNOSIS — R29700 NIHSS score 0: Secondary | ICD-10-CM | POA: Diagnosis present

## 2020-02-16 DIAGNOSIS — Z992 Dependence on renal dialysis: Secondary | ICD-10-CM

## 2020-02-16 DIAGNOSIS — E8889 Other specified metabolic disorders: Secondary | ICD-10-CM | POA: Diagnosis present

## 2020-02-16 DIAGNOSIS — E1151 Type 2 diabetes mellitus with diabetic peripheral angiopathy without gangrene: Secondary | ICD-10-CM | POA: Diagnosis present

## 2020-02-16 DIAGNOSIS — Z87891 Personal history of nicotine dependence: Secondary | ICD-10-CM

## 2020-02-16 DIAGNOSIS — E11621 Type 2 diabetes mellitus with foot ulcer: Secondary | ICD-10-CM | POA: Diagnosis present

## 2020-02-16 DIAGNOSIS — E1165 Type 2 diabetes mellitus with hyperglycemia: Secondary | ICD-10-CM | POA: Diagnosis present

## 2020-02-16 DIAGNOSIS — I5042 Chronic combined systolic (congestive) and diastolic (congestive) heart failure: Secondary | ICD-10-CM | POA: Diagnosis present

## 2020-02-16 DIAGNOSIS — H409 Unspecified glaucoma: Secondary | ICD-10-CM | POA: Diagnosis present

## 2020-02-16 DIAGNOSIS — E11319 Type 2 diabetes mellitus with unspecified diabetic retinopathy without macular edema: Secondary | ICD-10-CM | POA: Diagnosis present

## 2020-02-16 DIAGNOSIS — Z8249 Family history of ischemic heart disease and other diseases of the circulatory system: Secondary | ICD-10-CM

## 2020-02-16 DIAGNOSIS — Z83438 Family history of other disorder of lipoprotein metabolism and other lipidemia: Secondary | ICD-10-CM

## 2020-02-16 DIAGNOSIS — Z7902 Long term (current) use of antithrombotics/antiplatelets: Secondary | ICD-10-CM

## 2020-02-17 ENCOUNTER — Other Ambulatory Visit: Payer: Self-pay

## 2020-02-17 ENCOUNTER — Emergency Department (HOSPITAL_COMMUNITY): Payer: Medicare PPO

## 2020-02-17 ENCOUNTER — Encounter (HOSPITAL_COMMUNITY): Payer: Self-pay | Admitting: Emergency Medicine

## 2020-02-17 ENCOUNTER — Inpatient Hospital Stay (HOSPITAL_COMMUNITY): Payer: Medicare PPO

## 2020-02-17 ENCOUNTER — Inpatient Hospital Stay (HOSPITAL_COMMUNITY)
Admission: EM | Admit: 2020-02-17 | Discharge: 2020-02-21 | DRG: 064 | Disposition: A | Discharge status: Home Health | Payer: Medicare PPO | Attending: Student in an Organized Health Care Education/Training Program | Admitting: Student in an Organized Health Care Education/Training Program

## 2020-02-17 ENCOUNTER — Encounter (HOSPITAL_COMMUNITY): Payer: Medicare PPO

## 2020-02-17 DIAGNOSIS — N2581 Secondary hyperparathyroidism of renal origin: Secondary | ICD-10-CM | POA: Diagnosis present

## 2020-02-17 DIAGNOSIS — I6389 Other cerebral infarction: Secondary | ICD-10-CM

## 2020-02-17 DIAGNOSIS — I739 Peripheral vascular disease, unspecified: Secondary | ICD-10-CM | POA: Diagnosis not present

## 2020-02-17 DIAGNOSIS — Z992 Dependence on renal dialysis: Secondary | ICD-10-CM | POA: Diagnosis not present

## 2020-02-17 DIAGNOSIS — Z87891 Personal history of nicotine dependence: Secondary | ICD-10-CM | POA: Diagnosis not present

## 2020-02-17 DIAGNOSIS — Z8673 Personal history of transient ischemic attack (TIA), and cerebral infarction without residual deficits: Secondary | ICD-10-CM | POA: Diagnosis not present

## 2020-02-17 DIAGNOSIS — I4469 Other fascicular block: Secondary | ICD-10-CM | POA: Diagnosis not present

## 2020-02-17 DIAGNOSIS — I452 Bifascicular block: Secondary | ICD-10-CM | POA: Diagnosis present

## 2020-02-17 DIAGNOSIS — E785 Hyperlipidemia, unspecified: Secondary | ICD-10-CM | POA: Diagnosis not present

## 2020-02-17 DIAGNOSIS — N400 Enlarged prostate without lower urinary tract symptoms: Secondary | ICD-10-CM | POA: Diagnosis not present

## 2020-02-17 DIAGNOSIS — E11319 Type 2 diabetes mellitus with unspecified diabetic retinopathy without macular edema: Secondary | ICD-10-CM | POA: Diagnosis present

## 2020-02-17 DIAGNOSIS — E1165 Type 2 diabetes mellitus with hyperglycemia: Secondary | ICD-10-CM | POA: Diagnosis present

## 2020-02-17 DIAGNOSIS — I2510 Atherosclerotic heart disease of native coronary artery without angina pectoris: Secondary | ICD-10-CM | POA: Diagnosis present

## 2020-02-17 DIAGNOSIS — I252 Old myocardial infarction: Secondary | ICD-10-CM | POA: Diagnosis not present

## 2020-02-17 DIAGNOSIS — I639 Cerebral infarction, unspecified: Secondary | ICD-10-CM | POA: Diagnosis present

## 2020-02-17 DIAGNOSIS — R569 Unspecified convulsions: Secondary | ICD-10-CM | POA: Diagnosis not present

## 2020-02-17 DIAGNOSIS — I951 Orthostatic hypotension: Secondary | ICD-10-CM

## 2020-02-17 DIAGNOSIS — Z7902 Long term (current) use of antithrombotics/antiplatelets: Secondary | ICD-10-CM | POA: Diagnosis not present

## 2020-02-17 DIAGNOSIS — E8889 Other specified metabolic disorders: Secondary | ICD-10-CM | POA: Diagnosis present

## 2020-02-17 DIAGNOSIS — Z20822 Contact with and (suspected) exposure to covid-19: Secondary | ICD-10-CM | POA: Diagnosis present

## 2020-02-17 DIAGNOSIS — E1151 Type 2 diabetes mellitus with diabetic peripheral angiopathy without gangrene: Secondary | ICD-10-CM | POA: Diagnosis present

## 2020-02-17 DIAGNOSIS — G8191 Hemiplegia, unspecified affecting right dominant side: Secondary | ICD-10-CM | POA: Diagnosis present

## 2020-02-17 DIAGNOSIS — I998 Other disorder of circulatory system: Secondary | ICD-10-CM | POA: Diagnosis not present

## 2020-02-17 DIAGNOSIS — R52 Pain, unspecified: Secondary | ICD-10-CM | POA: Diagnosis not present

## 2020-02-17 DIAGNOSIS — N186 End stage renal disease: Secondary | ICD-10-CM | POA: Diagnosis present

## 2020-02-17 DIAGNOSIS — I132 Hypertensive heart and chronic kidney disease with heart failure and with stage 5 chronic kidney disease, or end stage renal disease: Secondary | ICD-10-CM | POA: Diagnosis not present

## 2020-02-17 DIAGNOSIS — I5042 Chronic combined systolic (congestive) and diastolic (congestive) heart failure: Secondary | ICD-10-CM | POA: Diagnosis present

## 2020-02-17 DIAGNOSIS — I440 Atrioventricular block, first degree: Secondary | ICD-10-CM | POA: Diagnosis present

## 2020-02-17 DIAGNOSIS — I429 Cardiomyopathy, unspecified: Secondary | ICD-10-CM | POA: Diagnosis present

## 2020-02-17 DIAGNOSIS — E1122 Type 2 diabetes mellitus with diabetic chronic kidney disease: Secondary | ICD-10-CM | POA: Diagnosis present

## 2020-02-17 DIAGNOSIS — I5020 Unspecified systolic (congestive) heart failure: Secondary | ICD-10-CM | POA: Diagnosis not present

## 2020-02-17 DIAGNOSIS — D649 Anemia, unspecified: Secondary | ICD-10-CM | POA: Diagnosis not present

## 2020-02-17 DIAGNOSIS — D631 Anemia in chronic kidney disease: Secondary | ICD-10-CM | POA: Diagnosis present

## 2020-02-17 LAB — BASIC METABOLIC PANEL
Anion gap: 12 (ref 5–15)
BUN: 16 mg/dL (ref 8–23)
CO2: 29 mmol/L (ref 22–32)
Calcium: 8.6 mg/dL — ABNORMAL LOW (ref 8.9–10.3)
Chloride: 96 mmol/L — ABNORMAL LOW (ref 98–111)
Creatinine, Ser: 2.21 mg/dL — ABNORMAL HIGH (ref 0.61–1.24)
GFR calc Af Amer: 34 mL/min — ABNORMAL LOW (ref >60)
GFR calc non Af Amer: 30 mL/min — ABNORMAL LOW (ref >60)
Glucose, Bld: 272 mg/dL — ABNORMAL HIGH (ref 70–99)
Potassium: 3.6 mmol/L (ref 3.5–5.1)
Sodium: 137 mmol/L (ref 135–145)

## 2020-02-17 LAB — CBC
HCT: 27.5 % — ABNORMAL LOW (ref 39.0–52.0)
Hemoglobin: 8.5 g/dL — ABNORMAL LOW (ref 13.0–17.0)
MCH: 28.3 pg (ref 26.0–34.0)
MCHC: 30.9 g/dL (ref 30.0–36.0)
MCV: 91.7 fL (ref 80.0–100.0)
Platelets: 297 10*3/uL (ref 150–400)
RBC: 3 MIL/uL — ABNORMAL LOW (ref 4.22–5.81)
RDW: 14.2 % (ref 11.5–15.5)
WBC: 9.6 10*3/uL (ref 4.0–10.5)
nRBC: 0 % (ref 0.0–0.2)

## 2020-02-17 LAB — PROTIME-INR
INR: 1.5 — ABNORMAL HIGH (ref 0.8–1.2)
Prothrombin Time: 17.2 seconds — ABNORMAL HIGH (ref 11.4–15.2)

## 2020-02-17 LAB — SARS CORONAVIRUS 2 BY RT PCR (HOSPITAL ORDER, PERFORMED IN ~~LOC~~ HOSPITAL LAB): SARS Coronavirus 2: NEGATIVE

## 2020-02-17 LAB — APTT: aPTT: 27 seconds (ref 24–36)

## 2020-02-17 MED ORDER — VITAMIN B-12 1000 MCG PO TABS
1000.0000 ug | ORAL_TABLET | Freq: Every day | ORAL | Status: DC
  Administered 2020-02-18 – 2020-02-21 (×4): 1000 ug via ORAL
  Filled 2020-02-17 (×4): qty 1

## 2020-02-17 MED ORDER — ASPIRIN 325 MG PO TABS
325.0000 mg | ORAL_TABLET | Freq: Once | ORAL | Status: AC
  Administered 2020-02-17: 325 mg via ORAL
  Filled 2020-02-17: qty 1

## 2020-02-17 MED ORDER — CARVEDILOL 3.125 MG PO TABS: 25.0000 mg | ORAL_TABLET | ORAL | Status: DC

## 2020-02-17 MED ORDER — ASPIRIN EC 81 MG PO TBEC
81.0000 mg | DELAYED_RELEASE_TABLET | Freq: Every day | ORAL | Status: DC
  Administered 2020-02-18 – 2020-02-21 (×4): 81 mg via ORAL
  Filled 2020-02-17 (×4): qty 1

## 2020-02-17 MED ORDER — SENNOSIDES-DOCUSATE SODIUM 8.6-50 MG PO TABS: 1.0000 | ORAL_TABLET | Freq: Every evening | ORAL | Status: DC | PRN

## 2020-02-17 MED ORDER — CLOPIDOGREL BISULFATE 75 MG PO TABS
75.0000 mg | ORAL_TABLET | Freq: Every day | ORAL | Status: DC
  Administered 2020-02-18 – 2020-02-21 (×4): 75 mg via ORAL
  Filled 2020-02-17 (×4): qty 1

## 2020-02-17 MED ORDER — ASPIRIN 325 MG PO TABS: 325.0000 mg | ORAL_TABLET | Freq: Every day | ORAL | Status: DC

## 2020-02-17 MED ORDER — ACETAMINOPHEN 500 MG PO TABS
500.0000 mg | ORAL_TABLET | Freq: Four times a day (QID) | ORAL | Status: DC | PRN
  Administered 2020-02-21 (×2): 500 mg via ORAL
  Filled 2020-02-17 (×2): qty 1

## 2020-02-17 MED ORDER — ATORVASTATIN CALCIUM 80 MG PO TABS
80.0000 mg | ORAL_TABLET | Freq: Every day | ORAL | Status: DC
  Administered 2020-02-19 (×2): 80 mg via ORAL
  Filled 2020-02-17 (×2): qty 1

## 2020-02-17 MED ORDER — FINASTERIDE 5 MG PO TABS
5.0000 mg | ORAL_TABLET | Freq: Every day | ORAL | Status: DC
  Administered 2020-02-18 – 2020-02-21 (×4): 5 mg via ORAL
  Filled 2020-02-17 (×4): qty 1

## 2020-02-17 MED ORDER — TAMSULOSIN HCL 0.4 MG PO CAPS
0.4000 mg | ORAL_CAPSULE | Freq: Every day | ORAL | Status: DC
  Administered 2020-02-18: 0.4 mg via ORAL
  Filled 2020-02-17: qty 1

## 2020-02-17 MED ORDER — HYDRALAZINE HCL 25 MG PO TABS: 25.0000 mg | ORAL_TABLET | ORAL | Status: DC

## 2020-02-17 MED ORDER — HEPARIN SODIUM (PORCINE) 5000 UNIT/ML IJ SOLN
5000.0000 [IU] | Freq: Three times a day (TID) | INTRAMUSCULAR | Status: DC
  Administered 2020-02-18 – 2020-02-21 (×9): 5000 [IU] via SUBCUTANEOUS
  Filled 2020-02-17 (×8): qty 1

## 2020-02-17 MED ORDER — SEVELAMER CARBONATE 800 MG PO TABS
800.0000 mg | ORAL_TABLET | ORAL | Status: DC
  Administered 2020-02-18 – 2020-02-20 (×4): 800 mg via ORAL
  Filled 2020-02-17 (×2): qty 1

## 2020-02-17 MED ORDER — SEVELAMER CARBONATE 800 MG PO TABS
800.0000 mg | ORAL_TABLET | ORAL | Status: DC
  Administered 2020-02-19 – 2020-02-21 (×5): 800 mg via ORAL
  Filled 2020-02-17 (×7): qty 1

## 2020-02-17 MED ORDER — DORZOLAMIDE HCL-TIMOLOL MAL 2-0.5 % OP SOLN
1.0000 [drp] | Freq: Two times a day (BID) | OPHTHALMIC | Status: DC
  Administered 2020-02-18 – 2020-02-21 (×7): 1 [drp] via OPHTHALMIC
  Filled 2020-02-17: qty 10

## 2020-02-17 NOTE — Consult Note (Signed)
[facility] [place]  Patient name: Dennis Macias MRN: [id number] DOB: Feb 08, 1951 Sex: male    HPI: Dennis Macias is a 69 y.o. male seen in the emergency department with concern regarding ischemia to the left foot.  He presented to the emergency department with generalized weakness.  MRI shows bilateral acute strokes.  He recently underwent evaluation for ischemia to his left foot.  He presented to our office with ulceration on the tip of his left toe and severe ischemic changes.  He underwent arteriogram with Dr. Trula Slade on 02/09/2020.  This revealed occlusion of all tibial vessels.  He had atherectomy and angioplasty of his posterior tibial artery.  There was concern from the admitting service regarding flow to his left foot and we were consulted.  He is alert and oriented currently.  He does report some discomfort in his foot when he is walking which is unchanged from before.  Past Medical History:  Diagnosis Date  . Anemia   . Arthritis    past hx   . Blindness    right eye  . Cardiorenal syndrome   . Cataract    removed both eyes  . Dehydration   . Diabetes ([place])   . Glaucoma   . History of CVA (cerebrovascular accident) 09/13/2015  . History of stroke 09/13/2015  . History of urinary retention   . Hyperlipidemia   . Hypertension   . NSTEMI (non-ST elevated myocardial infarction) ([place])   . Pernicious anemia 02/24/2018  . S/P TURP   . Stroke [facility])    2017- March  . Syncope 11/2019  . Tachycardia 08/26/2017  . Tubular adenoma of colon 02/2017  . Weight loss, non-intentional 08/26/2017   10 lbs between 6/18 & 2/19    Family History  Problem Relation Age of Onset  . Hypertension Mother   . Hyperlipidemia Mother   . Hyperlipidemia Father   . Colon cancer Neg Hx   . Colon polyps Neg Hx   . Esophageal cancer Neg Hx   . Rectal cancer Neg Hx   . Stomach cancer Neg Hx     SOCIAL HISTORY: Social History   Tobacco Use  .  Smoking status: Former Research scientist (life sciences)  . Smokeless tobacco: Former Systems developer    Types: Chew    Quit date: 07/09/1978  . Tobacco comment: quit 1 year ago  Substance Use Topics  . Alcohol use: No    Alcohol/week: 1.0 standard drink    Types: 1 Cans of beer per week    Comment: quit last march/2017    Allergies  Allergen Reactions  . Cefepime Other (See Comments)    Pt had BAD encephalopathy from Cefepime    Current Facility-Administered Medications  Medication Dose Route Frequency Provider Last Rate Last Admin  . 0.9 %  sodium chloride infusion  250 mL Intravenous PRN Serafina Mitchell, MD      . acetaminophen (TYLENOL) tablet 500 mg  500 mg Oral Q6H PRN Seawell, Jaimie A, DO      . [START ON 02/18/2020] aspirin EC tablet 81 mg  81 mg Oral Daily Seawell, Jaimie A, DO      . atorvastatin (LIPITOR) tablet 80 mg  80 mg Oral QHS Seawell, Jaimie A, DO      . [START ON 02/18/2020] clopidogrel (PLAVIX) tablet 75 mg  75 mg Oral Daily Seawell, Jaimie A, DO      . dorzolamide-timolol (COSOPT) 22.3-6.8 MG/ML ophthalmic solution 1 drop  1 drop Right Eye BID  Seawell, Jaimie A, DO      . [START ON 02/18/2020] finasteride (PROSCAR) tablet 5 mg  5 mg Oral Daily Seawell, Jaimie A, DO      . heparin injection 5,000 Units  5,000 Units Subcutaneous Q8H Seawell, Jaimie A, DO      . senna-docusate (Senokot-S) tablet 1 tablet  1 tablet Oral QHS PRN Seawell, Jaimie A, DO      . [START ON 02/19/2020] sevelamer carbonate (RENVELA) tablet 800 mg  800 mg Oral 3 times per day on Sun Mon Wed Fri Seawell, Jaimie A, DO      . [START ON 02/18/2020] sevelamer carbonate (RENVELA) tablet 800 mg  800 mg Oral 2 times per day on Tue Thu Sat Axel Filler, MD      . Derrill Memo ON 02/18/2020] tamsulosin [facility]) capsule 0.4 mg  0.4 mg Oral QPC supper Seawell, Jaimie A, DO      . [START ON 02/18/2020] vitamin B-12 (CYANOCOBALAMIN) tablet 1,000 mcg  1,000 mcg Oral Daily Seawell, Jaimie A, DO       Current Outpatient Medications  Medication Sig  Dispense Refill  . acetaminophen (TYLENOL) 500 MG tablet Take 500 mg by mouth every 6 (six) hours as needed for moderate pain or headache.    Marland Kitchen amLODipine (NORVASC) 10 MG tablet Take 1 tablet (10 mg total) by mouth daily. (Patient taking differently: Take 10 mg by mouth See admin instructions. Take 10 mg by mouth in the morning on Sun/Mon/Wed/Fri and in the evening on Tues/Thurs/Sat) 30 tablet 0  . aspirin EC 81 MG tablet Take 1 tablet (81 mg total) by mouth daily.    Marland Kitchen atorvastatin (LIPITOR) 80 MG tablet Take 1 tablet (80 mg total) by mouth at bedtime. IM program 90 tablet 3  . carvedilol (COREG) 25 MG tablet Take 1 tablet (25 mg total) by mouth 2 (two) times daily with a meal. (Patient taking differently: Take 25 mg by mouth See admin instructions. Take 25 mg by mouth two times a day on Sun/Mon/Wed/Fri and 25 mg in the evening ONLY on Tues/Thurs/Sat) 180 tablet 1  . clopidogrel (PLAVIX) 75 MG tablet TAKE 1 TABLET BY MOUTH ONCE DAILY. (Patient taking differently: Take 75 mg by mouth daily. ) 30 tablet 1  . dorzolamide-timolol (COSOPT) 22.3-6.8 MG/ML ophthalmic solution Place 1 drop into the right eye 2 (two) times daily. 10 mL 10  . finasteride (PROSCAR) 5 MG tablet Take 1 tablet (5 mg total) by mouth daily. 30 tablet 0  . furosemide (LASIX) 80 MG tablet TAKE 1 TABLET (80 MG TOTAL) BY MOUTH 2 (TWO) TIMES DAILY. (Patient taking differently: Take 80 mg by mouth See admin instructions. Take 80 mg by mouth in the morning and at 4 PM daily) 60 tablet 0  . hydrALAZINE (APRESOLINE) 25 MG tablet Take 25 mg by mouth See admin instructions. Take 25 mg by mouth two times a day on Sun/Mon/Wed/Fri and in the evening ONLY on Tues/Thurs/Sat    . isosorbide mononitrate (IMDUR) 60 MG 24 hr tablet TAKE 1 TABLET (60 MG TOTAL) BY MOUTH DAILY. (Patient taking differently: Take 60 mg by mouth See admin instructions. Take 60 mg by mouth in the morning on Sun/Mon/Wed/Fri and in the evening on Tues/Thurs/Sat) 30 tablet 0  .  polyethylene glycol (MIRALAX / GLYCOLAX) 17 g packet Take 17 g by mouth daily as needed for moderate constipation (MIX AND DRINK).     Marland Kitchen sevelamer carbonate (RENVELA) 800 MG tablet Take 800 mg by mouth See  admin instructions. Take 800 mg by mouth three times a day with food on Sun/Mon/Wed/Fri and two times a day on Tues/Thurs/Sat    . tamsulosin (FLOMAX) 0.4 MG CAPS capsule Take 1 capsule (0.4 mg total) by mouth daily after supper. 30 capsule 0  . vitamin B-12 (CYANOCOBALAMIN) 1000 MCG tablet Take 1 tablet (1,000 mcg total) by mouth daily. 30 tablet 0  . Accu-Chek FastClix Lancets MISC Check blood sugar up to 7 times a week as instructed 102 each 3  . Blood Glucose Monitoring Suppl (ACCU-CHEK GUIDE) w/Device KIT 1 each by Does not apply route daily. Check blood sugar as instructed up to 7 times a week 1 kit 0  . glucose blood (ACCU-CHEK GUIDE) test strip Check blood sugar up to 7 times a week as instructed 100 each 3  . hydrALAZINE (APRESOLINE) 50 MG tablet Take 1 tablet (50 mg total) by mouth every 8 (eight) hours. (Patient not taking: Reported on 02/17/2020) 90 tablet 0    REVIEW OF SYSTEMS:  Reviewed and as history and physical with nothing to add other than his past medical history  PHYSICAL EXAM: Vitals:   02/17/20 1023 02/17/20 1239 02/17/20 1827 02/17/20 2105  BP: (!) 127/58 (!) 160/67 (!) 162/68 (!) 157/51  Pulse: 66 82 63 69  Resp: 18 18 17 17   Temp: 98.7 F (37.1 C)  98.9 F (37.2 C) 98.3 F (36.8 C)  TempSrc: Oral  Oral Oral  SpO2: 95% 100% 100% 100%    GENERAL: The patient is a well-nourished male, in no acute distress. The vital signs are documented above. CARDIOVASCULAR: Palpable left femoral pulse.  I do not palpate a left popliteal or distal pulses.  He has excellent biphasic posterior tibial signal at the ankle and monophasic dorsalis pedis signal. PULMONARY: There is good air exchange  MUSCULOSKELETAL: There are no major deformities or cyanosis. NEUROLOGIC: Motor and  sensory exam intact of both feet SKIN: There are no ulcers or rashes noted. PSYCHIATRIC: The patient has a normal affect.  DATA:  None  MEDICAL ISSUES: Admitted with acute stroke.  Status post left posterior tibial artery atherectomy and angioplasty with Dr. Trula Slade for nonhealing ulcer on the tip of his left great toe.  Appears to have excellent result from this with excellent Doppler flow in his foot.  I will notify Dr. Trula Slade of his admission    Rosetta Posner, MD [facility] Tel [phone] Pager [phone]

## 2020-02-17 NOTE — ED Notes (Signed)
Patient transported to MRI 

## 2020-02-17 NOTE — ED Provider Notes (Signed)
[facility] EMERGENCY DEPARTMENT Provider Note   CSN: [id number] Arrival date & time: 02/16/20  2238     History Chief Complaint  Patient presents with  . Weakness    Dennis Macias is a 69 y.o. male with a past medical history of prior CVA, prior NSTEMI, anemia, ESRD on dialysis Tu, Th, Sat, presenting to the ED with a chief complaint of weakness.  Majority of history is provided by girlfriend at the bedside.  States that yesterday just prior to arrival, 16 hours ago, patient had a 3 episodes of "weakness, just not moving or responding."  States that he clenched up both of his arms and lean to the right side and this lasted for about 1 minute.  This did not resolved, several minutes later happened again.  States that this happened 3 times.  She denies any loss of consciousness.  They then called 911 and patient was brought to the ER.  Patient does not remember this event, does remember going to the bathroom and feeling generally weak.  He denied any prior or current headaches, blurry vision, numbness or weakness in arms or legs, chest pain, shortness of breath, abdominal pain, nausea, vomiting or diarrhea.  He only complains of left foot pain for the past week.  HPI     Past Medical History:  Diagnosis Date  . Anemia   . Arthritis    past hx   . Blindness    right eye  . Cardiorenal syndrome   . Cataract    removed both eyes  . Dehydration   . Diabetes ([place])   . Glaucoma   . History of CVA (cerebrovascular accident) 09/13/2015  . History of stroke 09/13/2015  . History of urinary retention   . Hyperlipidemia   . Hypertension   . NSTEMI (non-ST elevated myocardial infarction) ([place])   . Pernicious anemia 02/24/2018  . S/P TURP   . Stroke [facility])    2017- March  . Syncope 11/2019  . Tachycardia 08/26/2017  . Tubular adenoma of colon 02/2017  . Weight loss, non-intentional 08/26/2017   10 lbs between 6/18 & 2/19    Patient Active Problem List   Diagnosis Date  Noted  . ESRD (end stage renal disease) ([place])   . CKD (chronic kidney disease), stage V ([place])   . Goals of care, counseling/discussion   . Acute-on-chronic kidney injury ([place]) 01/13/2020  . Hyperkalemia   . Shortness of breath 12/24/2019  . Acute on chronic combined systolic and diastolic CHF (congestive heart failure) ([place]) 12/24/2019  . Non-Obstructive CAD 12/14/2019  . Encephalopathy, metabolic 85/27/7824  . BPH (benign prostatic hyperplasia) 12/08/2019  . Anemia of chronic disease   . Protein-calorie malnutrition, severe 11/19/2019  . Acute ischemic left ICA stroke ([place])   . Gait disorder   . Chronic combined systolic and diastolic heart failure ([place])   . Non-STEMI (non-ST elevated myocardial infarction) ([place])   . Pleural effusion transudative   . Nephrotic syndrome 08/05/2019  . Protein calorie malnutrition ([place]) 07/31/2019  . Left renal mass 07/21/2019  . CKD (chronic kidney disease) stage 4, GFR 15-29 ml/min (HCC) 04/23/2019  . Weight loss, non-intentional 08/26/2017  . 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 Acute on chronic anemia 12/21/2016  . Hyperlipidemia 09/13/2015  . Diabetes mellitus with retinopathy of both eyes ([place])   . Essential hypertension     Past Surgical History:  Procedure Laterality Date  . ABDOMINAL AORTOGRAM W/LOWER EXTREMITY N/A 02/09/2020   Procedure: ABDOMINAL AORTOGRAM W/LOWER  EXTREMITY;  Surgeon: Serafina Mitchell, MD;  Location: [place] CV LAB;  Service: Cardiovascular;  Laterality: N/A;  . AV FISTULA PLACEMENT Right 01/19/2020   Procedure: BRACHIOCEPHALIC ARTERIOVENOUS (AV) FISTULA CREATION;  Surgeon: Waynetta Sandy, MD;  Location: [place];  Service: Vascular;  Laterality: Right;  . CATARACT EXTRACTION, BILATERAL    . COLONOSCOPY    . IR FLUORO GUIDE CV LINE RIGHT  01/14/2020  . IR THORACENTESIS ASP PLEURAL SPACE W/IMG GUIDE  09/21/2019  . IR THORACENTESIS ASP PLEURAL SPACE W/IMG GUIDE  10/16/2019  . IR US GUIDE VASC ACCESS RIGHT  01/15/2020    . LEFT HEART CATH AND CORONARY ANGIOGRAPHY N/A 11/19/2019   Procedure: LEFT HEART CATH AND CORONARY ANGIOGRAPHY;  Surgeon: Jettie Booze, MD;  Location: [place] CV LAB;  Service: Cardiovascular;  Laterality: N/A;  . PERIPHERAL VASCULAR INTERVENTION Left 02/09/2020   Procedure: PERIPHERAL VASCULAR INTERVENTION;  Surgeon: Serafina Mitchell, MD;  Location: [place] CV LAB;  Service: Cardiovascular;  Laterality: Left;  . POLYPECTOMY    . REFRACTIVE SURGERY  10/2017  . TEE WITHOUT CARDIOVERSION N/A 09/14/2015   Procedure: TRANSESOPHAGEAL ECHOCARDIOGRAM (TEE);  Surgeon: Larey Dresser, MD;  Location: [place];  Service: Cardiovascular;  Laterality: N/A;  . TRANSURETHRAL RESECTION OF PROSTATE N/A 10/20/2019   Procedure: TRANSURETHRAL RESECTION OF THE PROSTATE (TURP);  Surgeon: Irine Seal, MD;  Location: [facility];  Service: Urology;  Laterality: N/A;  . UPPER GASTROINTESTINAL ENDOSCOPY         Family History  Problem Relation Age of Onset  . Hypertension Mother   . Hyperlipidemia Mother   . Hyperlipidemia Father   . Colon cancer Neg Hx   . Colon polyps Neg Hx   . Esophageal cancer Neg Hx   . Rectal cancer Neg Hx   . Stomach cancer Neg Hx     Social History   Tobacco Use  . Smoking status: Former Research scientist (life sciences)  . Smokeless tobacco: Former Systems developer    Types: Chew    Quit date: 07/09/1978  . Tobacco comment: quit 1 year ago  Vaping Use  . Vaping Use: Never used  Substance Use Topics  . Alcohol use: No    Alcohol/week: 1.0 standard drink    Types: 1 Cans of beer per week    Comment: quit last march/2017  . Drug use: No    Home Medications Prior to Admission medications   Medication Sig Start Date End Date Taking? Authorizing Provider  acetaminophen (TYLENOL) 500 MG tablet Take 500 mg by mouth every 6 (six) hours as needed for moderate pain or headache.   Yes [provider]  amLODipine (NORVASC) 10 MG tablet Take 1 tablet (10 mg total) by mouth daily. Patient taking  differently: Take 10 mg by mouth See admin instructions. Take 10 mg by mouth in the morning on Sun/Mon/Wed/Fri and in the evening on Tues/Thurs/Sat 12/27/19  Yes Seawell, Jaimie A, DO  aspirin EC 81 MG tablet Take 1 tablet (81 mg total) by mouth daily. 10/22/19  Yes Eugenie Filler, MD  atorvastatin (LIPITOR) 80 MG tablet Take 1 tablet (80 mg total) by mouth at bedtime. IM program 12/03/19  Yes Angiulli, Lavon Paganini, PA-C  carvedilol (COREG) 25 MG tablet Take 1 tablet (25 mg total) by mouth 2 (two) times daily with a meal. Patient taking differently: Take 25 mg by mouth See admin instructions. Take 25 mg by mouth two times a day on Sun/Mon/Wed/Fri and 25 mg in the evening ONLY on  Tues/Thurs/Sat 01/04/20  Yes [name], Rylee, MD  clopidogrel (PLAVIX) 75 MG tablet TAKE 1 TABLET BY MOUTH ONCE DAILY. Patient taking differently: Take 75 mg by mouth daily.  01/26/20  Yes Bloomfield, Carley D, DO  dorzolamide-timolol (COSOPT) 22.3-6.8 MG/ML ophthalmic solution Place 1 drop into the right eye 2 (two) times daily. 12/03/19  Yes Angiulli, Lavon Paganini, PA-C  finasteride (PROSCAR) 5 MG tablet Take 1 tablet (5 mg total) by mouth daily. 12/03/19  Yes Angiulli, Lavon Paganini, PA-C  furosemide (LASIX) 80 MG tablet TAKE 1 TABLET (80 MG TOTAL) BY MOUTH 2 (TWO) TIMES DAILY. Patient taking differently: Take 80 mg by mouth See admin instructions. Take 80 mg by mouth in the morning and at 4 PM daily 01/26/20  Yes [place]field, Carley D, DO  hydrALAZINE (APRESOLINE) 25 MG tablet Take 25 mg by mouth See admin instructions. Take 25 mg by mouth two times a day on Sun/Mon/Wed/Fri and in the evening ONLY on Tues/Thurs/Sat   Yes [provider]  isosorbide mononitrate (IMDUR) 60 MG 24 hr tablet TAKE 1 TABLET (60 MG TOTAL) BY MOUTH DAILY. Patient taking differently: Take 60 mg by mouth See admin instructions. Take 60 mg by mouth in the morning on Sun/Mon/Wed/Fri and in the evening on Tues/Thurs/Sat 01/26/20 02/25/20 Yes Bloomfield, Carley D,  DO  polyethylene glycol (MIRALAX / GLYCOLAX) 17 g packet Take 17 g by mouth daily as needed for moderate constipation (MIX AND DRINK).    Yes [provider]  sevelamer carbonate (RENVELA) 800 MG tablet Take 800 mg by mouth See admin instructions. Take 800 mg by mouth three times a day with food on Sun/Mon/Wed/Fri and two times a day on Tues/Thurs/Sat   Yes [provider]  tamsulosin (FLOMAX) 0.4 MG CAPS capsule Take 1 capsule (0.4 mg total) by mouth daily after supper. 12/03/19  Yes Angiulli, Lavon Paganini, PA-C  vitamin B-12 (CYANOCOBALAMIN) 1000 MCG tablet Take 1 tablet (1,000 mcg total) by mouth daily. 12/03/19  Yes Angiulli, Lavon Paganini, PA-C  Accu-Chek FastClix Lancets MISC Check blood sugar up to 7 times a week as instructed 12/03/19   Angiulli, Lavon Paganini, PA-C  Blood Glucose Monitoring Suppl (ACCU-CHEK GUIDE) w/Device KIT 1 each by Does not apply route daily. Check blood sugar as instructed up to 7 times a week 12/03/19   Angiulli, Lavon Paganini, PA-C  glucose blood (ACCU-CHEK GUIDE) test strip Check blood sugar up to 7 times a week as instructed 12/03/19   Angiulli, Lavon Paganini, PA-C  hydrALAZINE (APRESOLINE) 50 MG tablet Take 1 tablet (50 mg total) by mouth every 8 (eight) hours. Patient not taking: Reported on 02/17/2020 12/26/19   Molli Hazard A, DO    Allergies    Cefepime  Review of Systems   Review of Systems  Constitutional: Negative for appetite change, chills and fever.  HENT: Negative for ear pain, rhinorrhea, sneezing and sore throat.   Eyes: Negative for photophobia and visual disturbance.  Respiratory: Negative for cough, chest tightness, shortness of breath and wheezing.   Cardiovascular: Negative for chest pain and palpitations.  Gastrointestinal: Negative for abdominal pain, blood in stool, constipation, diarrhea, nausea and vomiting.  Genitourinary: Negative for dysuria, hematuria and urgency.  Musculoskeletal: Positive for arthralgias. Negative for myalgias.  Skin:  Negative for rash.  Neurological: Negative for dizziness, weakness and light-headedness.    Physical Exam Updated Vital Signs BP (!) 157/51 (BP Location: Left Arm)   Pulse 69   Temp 98.3 F (36.8 C) (Oral)   Resp 17  SpO2 100%   Physical Exam Vitals and nursing note reviewed.  Constitutional:      General: He is not in acute distress.    Appearance: He is well-developed.  HENT:     Head: Normocephalic and atraumatic.     Nose: Nose normal.  Eyes:     General: No scleral icterus.       Right eye: No discharge.        Left eye: No discharge.     Conjunctiva/sclera: Conjunctivae normal.  Cardiovascular:     Rate and Rhythm: Normal rate and regular rhythm.     Heart sounds: Normal heart sounds. No murmur heard.  No friction rub. No gallop.   Pulmonary:     Effort: Pulmonary effort is normal. No respiratory distress.     Breath sounds: Normal breath sounds.  Abdominal:     General: Bowel sounds are normal. There is no distension.     Palpations: Abdomen is soft.     Tenderness: There is no abdominal tenderness. There is no guarding.  Musculoskeletal:        General: Normal range of motion.     Cervical back: Normal range of motion and neck supple.  Skin:    General: Skin is warm and dry.     Findings: No rash.  Neurological:     General: No focal deficit present.     Mental Status: He is alert and oriented to person, place, and time.     Cranial Nerves: No cranial nerve deficit.     Sensory: No sensory deficit.     Motor: No abnormal muscle tone.     Coordination: Coordination normal.     Comments: Alert, oriented to self, place, time and situation. Pupils reactive. No facial asymmetry noted. Cranial nerves appear grossly intact. Sensation intact to light touch on face, BUE and BLE. Strength 5/5 in BUE and BLE.     ED Results / Procedures / Treatments   Labs (all labs ordered are listed, but only abnormal results are displayed) Labs Reviewed  BASIC METABOLIC  PANEL - Abnormal; Notable for the following components:      Result Value   Chloride 96 (*)    Glucose, Bld 272 (*)    Creatinine, Ser 2.21 (*)    Calcium 8.6 (*)    GFR calc non Af Amer 30 (*)    GFR calc Af Amer 34 (*)    All other components within normal limits  CBC - Abnormal; Notable for the following components:   RBC 3.00 (*)    Hemoglobin 8.5 (*)    HCT 27.5 (*)    All other components within normal limits  PROTIME-INR - Abnormal; Notable for the following components:   Prothrombin Time 17.2 (*)    INR 1.5 (*)    All other components within normal limits  SARS CORONAVIRUS 2 BY RT PCR (HOSPITAL ORDER, [place] [facility])  APTT    EKG EKG Interpretation  Date/Time:  Wednesday February 17 2020 00:34:08 EDT Ventricular Rate:  56 PR Interval:  188 QRS Duration: 150 QT Interval:  504 QTC Calculation: 486 R Axis:   -80 Text Interpretation: Sinus bradycardia Right bundle branch block Left anterior fascicular block * Bifascicular block* Minimal voltage criteria for LVH, may be normal variant ( R in aVL ) Anteroseptal infarct , age undetermined T wave abnormality, consider lateral ischemia Abnormal ECG When compared with ECG of 01/12/2020, Right bundle branch block has replaced Incomplete right  bundle branch block Confirmed by Delora Fuel ([id number]) on 02/17/2020 12:40:48 AM   Radiology DG Chest 2 View  Result Date: 02/17/2020 CLINICAL DATA:  69 year old male with syncope while using the bathroom. EXAM: CHEST - 2 VIEW COMPARISON:  Chest radiographs 01/21/2020 and earlier. FINDINGS: Upright AP and lateral views of the chest. Stable right chest dual lumen dialysis type catheter. Small bilateral pleural effusions have regressed and possibly resolved since last month. No pneumothorax or pulmonary edema. No consolidation. Stable cardiomegaly. Other mediastinal contours are within normal limits. Visualized tracheal air column is within normal limits. No acute osseous  abnormality identified. Negative visible bowel gas pattern. IMPRESSION: 1. Virtually resolved small pleural effusions since last month. Stable cardiomegaly. 2. No acute cardiopulmonary abnormality. Electronically Signed   By: Genevie Ann M.D.   On: 02/17/2020 00:53   MR BRAIN WO CONTRAST  Result Date: 02/17/2020 CLINICAL DATA:  Recurrent syncope EXAM: MRI HEAD WITHOUT CONTRAST TECHNIQUE: Multiplanar, multiecho pulse sequences of the brain and surrounding structures were obtained without intravenous contrast. COMPARISON:  MRI head 11/12/2019 FINDINGS: Brain: Small areas of acute white matter infarct in the right frontal lobe and left parietal lobe. No cortical acute infarct. Moderate atrophy and extensive chronic microvascular ischemic change in the white matter. Brainstem and cerebellum intact. Scattered areas of chronic microhemorrhage in both cerebral hemispheres and the right external capsule. Negative for hydrocephalus or mass. Vascular: Normal arterial flow voids Skull and upper cervical spine: No focal skeletal lesion. Sinuses/Orbits: Mild mucosal edema paranasal sinuses. Bilateral cataract extraction. Other: None IMPRESSION: Acute white matter infarcts bilaterally. Moderate atrophy and advanced chronic microvascular ischemic changes in the white matter. Multiple areas of chronic microhemorrhage in both cerebral hemispheres. Correlate with hypertension history. Electronically Signed   By: Franchot Gallo M.D.   On: 02/17/2020 19:29    Procedures Procedures (including critical care time)  Medications Ordered in ED Medications  aspirin tablet 325 mg (has no administration in time range)    ED Course  I have reviewed the triage vital signs and the nursing notes.  Pertinent labs & imaging results that were available during my care of the patient were reviewed by me and considered in my medical decision making (see chart for details).    MDM Rules/Calculators/A&P                           69 year old male with, NSTEMI, anemia, ESRD on dialysis presenting to the ED with a chief complaint of weakness.  Yesterday prior to arrival, 16 hours prior to my evaluation he had 3 episodes of "weakness."  It appears that he had bilateral arm contractions and leading to the right side on 3 separate episodes.  After the third episode they called 911 and brought him to the ER.  He does not remember this event.  He denies any headache, loss of consciousness, blurry vision, numbness in arms or legs, history of seizures.  On exam patient is overall well-appearing.  No numbness or weakness noted of bilateral upper and lower extremities.  No neck stiffness noted.  Lab work significant for hemoglobin of 8.5 which is similar to prior.  BMP with kidney function based on his baseline ESRD.  Chest x-ray is unremarkable.  EKG shows right bundle branch block without ischemic changes.  MRI of the brain shows acute white matter infarcts bilaterally.  Suspect this is the cause of his symptoms.  Will admit to internal medicine service for stroke work-up.  I  have consulted neurology, Dr. Cheral Marker who will see the patient in consult. He did personally review patient's MRI.   Portions of this note were generated with [employer]. Dictation errors may occur despite best attempts at proofreading.  Final Clinical Impression(s) / ED Diagnoses Final diagnoses:  Cerebrovascular accident (CVA), unspecified mechanism [facility])    Rx / [place] Orders ED Discharge Orders    None       Lulu Riding 02/17/20 2119    Quintella Reichert, MD 02/17/20 2314

## 2020-02-17 NOTE — Consult Note (Signed)
Referring Physician: Dr. Evette Doffing    Chief Complaint: Acute strokes seen on MRI brain  HPI: Dennis Macias is an 69 y.o. male with a PMHx of stroke, DM2, CKD on HD, CHF, CAD, PVD with recent left lower extremity atherectomy and balloon angioplasty, HTN and HLD who presented to the ED via EMS after he had a syncopal episode at home while using the bathroom. He had been in his USOH prior to dialysis on Wednesday. He was unusually tired after HD, going to bed afterwards and sleeping until about 9 PM. When he woke up he was having difficulty walking to the bathroom. Balance was impaired to the extent that he had to grab the sink and use the wall for support when ambulating. When he got on the toilet, he leaned towards the right and his RUE appeared to go limp. He was also staring off; this episode of weakness lasted for about one minute. He then became weak again, leaning to the right with drooling witnessed by his daughter; this episode also lasted for about one minute. EMS was then called. On arrival to the ED, he was not aware of why he was brought to the hospital.   Home medications include ASA, Plavix and atorvastatin.   EKG: Sinus bradycardia Right bundle branch block Left anterior fascicular block  Bifascicular block Minimal voltage criteria for LVH, may be normal variant ( R in aVL ) Anteroseptal infarct , age undetermined T wave abnormality, consider lateral ischemia Abnormal ECG When compared with ECG of 01/12/2020, Right bundle branch block has replaced Incomplete right bundle branch block   Past Medical History:  Diagnosis Date  . Anemia   . Arthritis    past hx   . Blindness    right eye  . Cardiorenal syndrome   . Cataract    removed both eyes  . Dehydration   . Diabetes ([place])   . Glaucoma   . History of CVA (cerebrovascular accident) 09/13/2015  . History of stroke 09/13/2015  . History of urinary retention   . Hyperlipidemia   . Hypertension   . NSTEMI (non-ST elevated myocardial  infarction) ([place])   . Pernicious anemia 02/24/2018  . S/P TURP   . Stroke [facility])    2017- March  . Syncope 11/2019  . Tachycardia 08/26/2017  . Tubular adenoma of colon 02/2017  . Weight loss, non-intentional 08/26/2017   10 lbs between 6/18 & 2/19    Past Surgical History:  Procedure Laterality Date  . ABDOMINAL AORTOGRAM W/LOWER EXTREMITY N/A 02/09/2020   Procedure: ABDOMINAL AORTOGRAM W/LOWER EXTREMITY;  Surgeon: Serafina Mitchell, MD;  Location: [place] [facility];  Service: Cardiovascular;  Laterality: N/A;  . AV FISTULA PLACEMENT Right 01/19/2020   Procedure: BRACHIOCEPHALIC ARTERIOVENOUS (AV) FISTULA CREATION;  Surgeon: Waynetta Sandy, MD;  Location: [place];  Service: Vascular;  Laterality: Right;  . CATARACT EXTRACTION, BILATERAL    . COLONOSCOPY    . IR FLUORO GUIDE CV LINE RIGHT  01/14/2020  . IR THORACENTESIS ASP PLEURAL SPACE W/IMG GUIDE  09/21/2019  . IR THORACENTESIS ASP PLEURAL SPACE W/IMG GUIDE  10/16/2019  . IR US GUIDE VASC ACCESS RIGHT  01/15/2020  . LEFT HEART CATH AND CORONARY ANGIOGRAPHY N/A 11/19/2019   Procedure: LEFT HEART CATH AND CORONARY ANGIOGRAPHY;  Surgeon: Jettie Booze, MD;  Location: [place] [facility];  Service: Cardiovascular;  Laterality: N/A;  . PERIPHERAL VASCULAR INTERVENTION Left 02/09/2020   Procedure: PERIPHERAL VASCULAR INTERVENTION;  Surgeon: Serafina Mitchell, MD;  Location: [place] [facility];  Service: Cardiovascular;  Laterality: Left;  . POLYPECTOMY    . REFRACTIVE SURGERY  10/2017  . TEE WITHOUT CARDIOVERSION N/A 09/14/2015   Procedure: TRANSESOPHAGEAL ECHOCARDIOGRAM (TEE);  Surgeon: Larey Dresser, MD;  Location: [place];  Service: Cardiovascular;  Laterality: N/A;  . TRANSURETHRAL RESECTION OF PROSTATE N/A 10/20/2019   Procedure: TRANSURETHRAL RESECTION OF THE PROSTATE (TURP);  Surgeon: Irine Seal, MD;  Location: [facility];  Service: Urology;  Laterality: N/A;  . UPPER GASTROINTESTINAL ENDOSCOPY      Family History  Problem  Relation Age of Onset  . Hypertension Mother   . Hyperlipidemia Mother   . Hyperlipidemia Father   . Colon cancer Neg Hx   . Colon polyps Neg Hx   . Esophageal cancer Neg Hx   . Rectal cancer Neg Hx   . Stomach cancer Neg Hx    Social History:  reports that he has quit smoking. He quit smokeless tobacco use about 41 years ago.  His smokeless tobacco use included chew. He reports that he does not drink alcohol and does not use drugs.  Allergies:  Allergies  Allergen Reactions  . Cefepime Other (See Comments)    Pt had BAD encephalopathy from Cefepime    Medications:  Prior to Admission:  Facility-Administered Medications Prior to Admission  Medication Dose Route Frequency Provider Last Rate Last Admin  . 0.9 %  sodium chloride infusion  250 mL Intravenous PRN Serafina Mitchell, MD       Medications Prior to Admission  Medication Sig Dispense Refill Last Dose  . acetaminophen (TYLENOL) 500 MG tablet Take 500 mg by mouth every 6 (six) hours as needed for moderate pain or headache.   unk at [employer]  . amLODipine (NORVASC) 10 MG tablet Take 1 tablet (10 mg total) by mouth daily. (Patient taking differently: Take 10 mg by mouth See admin instructions. Take 10 mg by mouth in the morning on Sun/Mon/Wed/Fri and in the evening on Tues/Thurs/Sat) 30 tablet 0 02/16/2020 at pm  . aspirin EC 81 MG tablet Take 1 tablet (81 mg total) by mouth daily.   02/16/2020 at 0800  . atorvastatin (LIPITOR) 80 MG tablet Take 1 tablet (80 mg total) by mouth at bedtime. IM program 90 tablet 3 02/15/2020 at pm  . carvedilol (COREG) 25 MG tablet Take 1 tablet (25 mg total) by mouth 2 (two) times daily with a meal. (Patient taking differently: Take 25 mg by mouth See admin instructions. Take 25 mg by mouth two times a day on Sun/Mon/Wed/Fri and 25 mg in the evening ONLY on Tues/Thurs/Sat) 180 tablet 1 02/16/2020 at 2000  . clopidogrel (PLAVIX) 75 MG tablet TAKE 1 TABLET BY MOUTH ONCE DAILY. (Patient taking differently: Take 75  mg by mouth daily. ) 30 tablet 1 02/16/2020 at 0800  . dorzolamide-timolol (COSOPT) 22.3-6.8 MG/ML ophthalmic solution Place 1 drop into the right eye 2 (two) times daily. 10 mL 10 02/16/2020 at Unknown time  . finasteride (PROSCAR) 5 MG tablet Take 1 tablet (5 mg total) by mouth daily. 30 tablet 0 02/16/2020 at am  . furosemide (LASIX) 80 MG tablet TAKE 1 TABLET (80 MG TOTAL) BY MOUTH 2 (TWO) TIMES DAILY. (Patient taking differently: Take 80 mg by mouth See admin instructions. Take 80 mg by mouth in the morning and at 4 PM daily) 60 tablet 0 02/16/2020 at Unknown time  . hydrALAZINE (APRESOLINE) 25 MG tablet Take 25 mg by mouth See admin instructions. Take 25  mg by mouth two times a day on Sun/Mon/Wed/Fri and in the evening ONLY on Tues/Thurs/Sat   02/16/2020 at Unknown time  . isosorbide mononitrate (IMDUR) 60 MG 24 hr tablet TAKE 1 TABLET (60 MG TOTAL) BY MOUTH DAILY. (Patient taking differently: Take 60 mg by mouth See admin instructions. Take 60 mg by mouth in the morning on Sun/Mon/Wed/Fri and in the evening on Tues/Thurs/Sat) 30 tablet 0 02/16/2020 at Unknown time  . polyethylene glycol (MIRALAX / GLYCOLAX) 17 g packet Take 17 g by mouth daily as needed for moderate constipation (MIX AND DRINK).    unk at [facility]  . sevelamer carbonate (RENVELA) 800 MG tablet Take 800 mg by mouth See admin instructions. Take 800 mg by mouth three times a day with food on Sun/Mon/Wed/Fri and two times a day on Tues/Thurs/Sat   02/16/2020 at Unknown time  . tamsulosin (FLOMAX) 0.4 MG CAPS capsule Take 1 capsule (0.4 mg total) by mouth daily after supper. 30 capsule 0 02/16/2020 at pm  . vitamin B-12 (CYANOCOBALAMIN) 1000 MCG tablet Take 1 tablet (1,000 mcg total) by mouth daily. 30 tablet 0 02/16/2020 at Unknown time  . Accu-Chek FastClix Lancets MISC Check blood sugar up to 7 times a week as instructed 102 each 3   . Blood Glucose Monitoring Suppl (ACCU-CHEK GUIDE) w/Device KIT 1 each by Does not apply route daily. Check blood  sugar as instructed up to 7 times a week 1 kit 0   . glucose blood (ACCU-CHEK GUIDE) test strip Check blood sugar up to 7 times a week as instructed 100 each 3   . hydrALAZINE (APRESOLINE) 50 MG tablet Take 1 tablet (50 mg total) by mouth every 8 (eight) hours. (Patient not taking: Reported on 02/17/2020) 90 tablet 0 Not Taking at Unknown time   Scheduled: . aspirin EC  81 mg Oral Daily  . atorvastatin  80 mg Oral QHS  . Chlorhexidine Gluconate Cloth  6 each Topical Daily  . clopidogrel  75 mg Oral Daily  . dorzolamide-timolol  1 drop Right Eye BID  . finasteride  5 mg Oral Daily  . heparin  5,000 Units Subcutaneous Q8H  . insulin aspart  0-5 Units Subcutaneous QHS  . insulin aspart  0-9 Units Subcutaneous TID WC  . [START ON 02/19/2020] sevelamer carbonate  800 mg Oral 3 times per day on Sun Mon Wed Fri  . sevelamer carbonate  800 mg Oral 2 times per day on Tue Thu Sat  . tamsulosin  0.4 mg Oral QPC supper  . vitamin B-12  1,000 mcg Oral Daily   Continuous:    ROS: As per HPI. The patient is unable to provide additional detailed ROS due to confusion and poor insight.   Physical Examination: Blood pressure (!) 157/51, pulse 69, temperature 98.3 F (36.8 C), temperature source Oral, resp. rate 17, SpO2 100 %.  HEENT: [place]/AT Lungs: Respirations unlabored Ext: No edema  Neurologic Examination: Mental Status: Awake and alert. Oriented to city, state, day of week, year and month. Speech fluent with intact comprehension for basic commands and questions. Had difficulty following instructions to repeat a phrase. Could name thumb and pinky but not forefinger. Can recite the months of the year forwards, but becomes confused when reciting backwards. Unable to recall who the president is.  Cranial Nerves: II:  Visual fields intact in left eye with no extinction to DSS. Prominent corneal opacification OD. Pupil round and reactive OS.  III,IV, VI: No ptosis. EOMI with saccadic pursuits noted.  V,VII: Smile symmetric. Facial temp sensation equal bilaterally VIII: Hearing intact to voice.  IX,X:No hypophonia XI: Head is midline.  XII: Midline tongue extension Motor: Right : Upper extremity   5/5    Left:     Upper extremity   5/5  Lower extremity   5/5     Lower extremity   5/5 No pronator drift Sensory: Temp and light touch intact BUE and BLE. No extinction to DSS.  Deep Tendon Reflexes:  1+ bilateral brachioradialis and biceps.  2+ right patellar, 0 left patellar 0 achilles bilaterally Toes equivocal bilaterally  Cerebellar: No ataxia with FNF bilaterally  Gait: Deferred  Results for orders placed or performed during the hospital encounter of 02/17/20 (from the past 48 hour(s))  Basic metabolic panel     Status: Abnormal   Collection Time: 02/17/20 12:39 AM  Result Value Ref Range   Sodium 137 135 - 145 mmol/L   Potassium 3.6 3.5 - 5.1 mmol/L   Chloride 96 (L) 98 - 111 mmol/L   CO2 29 22 - 32 mmol/L   Glucose, Bld 272 (H) 70 - 99 mg/dL    Comment: Glucose reference range applies only to samples taken after fasting for at least 8 hours.   BUN 16 8 - 23 mg/dL   Creatinine, Ser 2.21 (H) 0.61 - 1.24 mg/dL   Calcium 8.6 (L) 8.9 - 10.3 mg/dL   GFR calc non Af Amer 30 (L) >60 mL/min   GFR calc Af Amer 34 (L) >60 mL/min   Anion gap 12 5 - 15    Comment: Performed at [facility] [address], [place], [place] [address]  CBC     Status: Abnormal   Collection Time: 02/17/20 12:39 AM  Result Value Ref Range   WBC 9.6 4.0 - 10.5 K/uL   RBC 3.00 (L) 4.22 - 5.81 MIL/uL   Hemoglobin 8.5 (L) 13.0 - 17.0 g/dL   HCT 27.5 (L) 39 - 52 %   MCV 91.7 80.0 - 100.0 fL   MCH 28.3 26.0 - 34.0 pg   MCHC 30.9 30.0 - 36.0 g/dL   RDW 14.2 11.5 - 15.5 %   Platelets 297 150 - 400 K/uL   nRBC 0.0 0.0 - 0.2 %    Comment: Performed at [facility], [place] [address], [place], [place] [address]  APTT     Status: None   Collection Time: 02/17/20  8:25 PM  Result Value Ref Range    aPTT 27 24 - 36 seconds    Comment: Performed at [facility] [address], [place], [place] [address]  Protime-INR     Status: Abnormal   Collection Time: 02/17/20  8:25 PM  Result Value Ref Range   Prothrombin Time 17.2 (H) 11.4 - 15.2 seconds   INR 1.5 (H) 0.8 - 1.2    Comment: (NOTE) INR goal varies based on device and disease states. Performed at [facility], [place] [address] [place], [place] [address]    DG Chest 2 View  Result Date: 02/17/2020 CLINICAL DATA:  69 year old male with syncope while using the bathroom. EXAM: CHEST - 2 VIEW COMPARISON:  Chest radiographs 01/21/2020 and earlier. FINDINGS: Upright AP and lateral views of the chest. Stable right chest dual lumen dialysis type catheter. Small bilateral pleural effusions have regressed and possibly resolved since last month. No pneumothorax or pulmonary edema. No consolidation. Stable cardiomegaly. Other mediastinal contours are within normal limits. Visualized tracheal air  column is within normal limits. No acute osseous abnormality identified. Negative visible bowel gas pattern. IMPRESSION: 1. Virtually resolved small pleural effusions since last month. Stable cardiomegaly. 2. No acute cardiopulmonary abnormality. Electronically Signed   By: Genevie Ann M.D.   On: 02/17/2020 00:53   MR BRAIN WO CONTRAST  Result Date: 02/17/2020 CLINICAL DATA:  Recurrent syncope EXAM: MRI HEAD WITHOUT CONTRAST TECHNIQUE: Multiplanar, multiecho pulse sequences of the brain and surrounding structures were obtained without intravenous contrast. COMPARISON:  MRI head 11/12/2019 FINDINGS: Brain: Small areas of acute white matter infarct in the right frontal lobe and left parietal lobe. No cortical acute infarct. Moderate atrophy and extensive chronic microvascular ischemic change in the white matter. Brainstem and cerebellum intact. Scattered areas of chronic microhemorrhage in both cerebral hemispheres and the right external capsule.  Negative for hydrocephalus or mass. Vascular: Normal arterial flow voids Skull and upper cervical spine: No focal skeletal lesion. Sinuses/Orbits: Mild mucosal edema paranasal sinuses. Bilateral cataract extraction. Other: None IMPRESSION: Acute white matter infarcts bilaterally. Moderate atrophy and advanced chronic microvascular ischemic changes in the white matter. Multiple areas of chronic microhemorrhage in both cerebral hemispheres. Correlate with hypertension history. Electronically Signed   By: Franchot Gallo M.D.   On: 02/17/2020 19:29    Assessment: 69 y.o. male presenting with 3 short-duration episodes of weakness with AMS at home following dialysis. MRI reveals acute small vessel white matter ischemic infarctions as well as chronic microhemorrhages.  1. Exam reveals no clear localizing findings. He has impaired concentration and mild bradyphrenia. Also with poor fund of knowledge, poor insight and mild naming difficulty when assessing speech.  2. MRI brain reveals subcentimeter acute white matter infarcts bilaterally. There is moderate atrophy.  Moderate to severe diffuse chronic microvascular ischemic changes in the white matter of the cerebral hemspheres is a prominent finding. Also noted are multiple areas of chronic microhemorrhage in both cerebral hemispheres.  3. Imaging DDx for the chronic findings primarily consists of amyloid angiopathy and chronic hypertensive arteriopathy. The size of the acute infarcts and small vessel distribution are also consistent with the above DDx for underlying etiology.  4. Stroke Risk Factors - stroke, DM2, CKD on HD, CHF, CAD, PVD with recent left lower extremity atherectomy and balloon angioplasty, HTN and HLD  Recommendations: 1. HgbA1c, fasting lipid panel 2. MRA of the brain without contrast 3. PT consult, OT consult, Speech consult 4. Echocardiogram 5. Carotid dopplers 6. Continue atorvastatin, ASA and Plavix 7. Risk factor modification 8.  Telemetry monitoring 9. Frequent neuro checks 10. BP goal: Normotensive.     @Electronically  signed: Dr. Kerney Elbe 02/17/2020, 9:18 PM

## 2020-02-17 NOTE — ED Notes (Signed)
Attempted report 

## 2020-02-17 NOTE — ED Triage Notes (Signed)
Patient here from home, came by ambulance.  Patient does not know why he is here.  Patient had a syncopal episode while using the bathroom.

## 2020-02-17 NOTE — H&P (Addendum)
Date: 02/18/2020               Patient Name:  Dennis Macias MRN: [id number]  DOB: 23-Aug-1950 Age / Sex: 69 y.o., male   PCP: [name], MD         Medical Service: Internal Medicine Teaching Service         Attending Physician: Dr. Evette Doffing, Mallie Mussel, *    First Contact: Dr. Kai Levins Pager: [phone]  Second Contact: Dr. Truman Hayward Pager: [phone]       After Hours (After 5p/  First Contact Pager: [phone]  weekends / holidays): Second Contact Pager: [phone]   Chief Complaint: sudden onset weakness  History of Present Illness:  Dennis Macias is a 69 y.o. person with PMHx L ICA CVA, NIDDM type II with retinopathy, CKD on hemodialysis, nephrotic syndrome, combined systolic and diastolic CHF, CAD w/ NSTEMI, HTN, HLD, and protein malnutrition who was brought to the ED by EMS after experiencing three distinct episodes of acute weakness with altered mental status. The patient's girlfriend at the bedside states that he was in his usual state of health prior to his dialysis appointment from noon-4pm today. She notes he was especially tired after returning from his dialysis session and had decreased appetite, unusual for him. He went to bed and slept until about 9:00pm. His girlfriend states she noticed that he had difficulty walking to the bathroom when he woke up (also unusual for him). She says he needed to grab onto the sink and the wall to hold himself up. He then recovered, but when he went to sit on the toilet, his body shifted towards the right and his right arm went limp by his side. She states he was staring off and weak for about 1 minute. He had a third episode lasting about 1 minute where he became weak again and leaned towards the right, and his daughter witnessed this episode and said he began drooling. Each episode progressively worsened, although the patient neared his baseline mental status between episodes. Between episodes, he was tired but his girlfriend said he was able to state  that he was okay and knew their names and spoke normally.   The patient remembers going to the bathroom and remembers EMS coming to the apartment and taking him to the hospital, but he doesn't recall any weakness or fatigue. His girlfriend does state he has complained of dizziness upon sitting or standing, but the patient denies this. He denies any syncope or pre-syncope, unsteadiness on his feet, palpitations, CP, SOB, cough, fevers, chills, or abdominal pain. Today, he states he felt in his usual state of health yesterday and all of today, except for new left lower calf pain that started 2 days ago. He states his distal posterior calf feels sore, especially to the touch or with exertion. He denies any weakness, numbness, or tingling. He did have left PT artery atherectomy and balloon angioplasty performed on 02/09/20 with good resulting flow into the left foot at the time, but continues to have ulceration of his left great toe that has not improved since the surgery. His girlfriend states that he is supposed to have this procedure repeated on the right 02/26/20. He denies any swelling or bruising to his lower extremities.   Social:  Patient previously chewed tobacco but stopped in 1980. He formerly drank alcohol but stopped in 2017. No other drug use.   Family History:  Mother - HTN, HLD Father - HLD  Both his parents are  deceased  Meds:  Current Facility-Administered Medications for the 02/17/20 encounter [facility] Encounter)  Medication  . 0.9 %  sodium chloride infusion   Current Meds  Medication Sig  . acetaminophen (TYLENOL) 500 MG tablet Take 500 mg by mouth every 6 (six) hours as needed for moderate pain or headache.  Marland Kitchen amLODipine (NORVASC) 10 MG tablet Take 1 tablet (10 mg total) by mouth daily. (Patient taking differently: Take 10 mg by mouth See admin instructions. Take 10 mg by mouth in the morning on Sun/Mon/Wed/Fri and in the evening on Tues/Thurs/Sat)  . aspirin EC 81 MG tablet Take  1 tablet (81 mg total) by mouth daily.  Marland Kitchen atorvastatin (LIPITOR) 80 MG tablet Take 1 tablet (80 mg total) by mouth at bedtime. IM program  . carvedilol (COREG) 25 MG tablet Take 1 tablet (25 mg total) by mouth 2 (two) times daily with a meal. (Patient taking differently: Take 25 mg by mouth See admin instructions. Take 25 mg by mouth two times a day on Sun/Mon/Wed/Fri and 25 mg in the evening ONLY on Tues/Thurs/Sat)  . clopidogrel (PLAVIX) 75 MG tablet TAKE 1 TABLET BY MOUTH ONCE DAILY. (Patient taking differently: Take 75 mg by mouth daily. )  . dorzolamide-timolol (COSOPT) 22.3-6.8 MG/ML ophthalmic solution Place 1 drop into the right eye 2 (two) times daily.  . finasteride (PROSCAR) 5 MG tablet Take 1 tablet (5 mg total) by mouth daily.  . furosemide (LASIX) 80 MG tablet TAKE 1 TABLET (80 MG TOTAL) BY MOUTH 2 (TWO) TIMES DAILY. (Patient taking differently: Take 80 mg by mouth See admin instructions. Take 80 mg by mouth in the morning and at 4 PM daily)  . hydrALAZINE (APRESOLINE) 25 MG tablet Take 25 mg by mouth See admin instructions. Take 25 mg by mouth two times a day on Sun/Mon/Wed/Fri and in the evening ONLY on Tues/Thurs/Sat  . isosorbide mononitrate (IMDUR) 60 MG 24 hr tablet TAKE 1 TABLET (60 MG TOTAL) BY MOUTH DAILY. (Patient taking differently: Take 60 mg by mouth See admin instructions. Take 60 mg by mouth in the morning on Sun/Mon/Wed/Fri and in the evening on Tues/Thurs/Sat)  . polyethylene glycol (MIRALAX / GLYCOLAX) 17 g packet Take 17 g by mouth daily as needed for moderate constipation (MIX AND DRINK).   Marland Kitchen sevelamer carbonate (RENVELA) 800 MG tablet Take 800 mg by mouth See admin instructions. Take 800 mg by mouth three times a day with food on Sun/Mon/Wed/Fri and two times a day on Tues/Thurs/Sat  . tamsulosin (FLOMAX) 0.4 MG CAPS capsule Take 1 capsule (0.4 mg total) by mouth daily after supper.  . vitamin B-12 (CYANOCOBALAMIN) 1000 MCG tablet Take 1 tablet (1,000 mcg total) by  mouth daily.    Allergies: Allergies as of 02/16/2020 - Review Complete 02/09/2020  Allergen Reaction Noted  . Cefepime Other (See Comments) 12/14/2019   Past Medical History:  Diagnosis Date  . Anemia   . Arthritis    past hx   . Blindness    right eye  . Cardiorenal syndrome   . Cataract    removed both eyes  . Dehydration   . Diabetes ([place])   . Glaucoma   . History of CVA (cerebrovascular accident) 09/13/2015  . History of stroke 09/13/2015  . History of urinary retention   . Hyperlipidemia   . Hypertension   . NSTEMI (non-ST elevated myocardial infarction) ([place])   . Pernicious anemia 02/24/2018  . S/P TURP   . Stroke [facility])    2017- March  .  Syncope 11/2019  . Tachycardia 08/26/2017  . Tubular adenoma of colon 02/2017  . Weight loss, non-intentional 08/26/2017   10 lbs between 6/18 & 2/19   Review of Systems: A complete ROS was negative except as per HPI.   Physical Exam: Blood pressure 114/81, pulse 67, temperature 98.2 F (36.8 C), temperature source Oral, resp. rate 19, weight 71 kg, SpO2 99 %. General: Patient appears mildly ill but in no acute distress. Eyes: Cornea is clouded in right eye. Left pupil is normal in size, and reactive to light and accomodation. Sclera non-icteric. No conjunctival injection.  HENT: Neck is supple. No nasal discharge. Respiratory: Lungs are CTA, bilaterally. No wheezes, rales, or rhonchi.  Cardiovascular: Regular rate and rhythm. No murmurs, rubs, or gallops. There is minimal nonpitting lower extremity edema, bilaterally. PT pulses 1+ bilaterally. DP pulse 1+ on the right and not palpable on the left. Doppler ultrasound confirmed presence of faint pulse on the left.  Abdominal: Soft and non-tender to palpation. Bowel sounds intact. No rebound or guarding. Skin: There are petechiae present on the left dorsal foot and left shin. There is a 1.5x1cm necrotic ulcer without active drainage and mild surrounding erythema on the tip of the left  first toe. There is a smaller erythematous area on the tip of the right toe. Left foot feels cool to the touch. Right foot warm and well-perfused.  Neurological: Nasal hemianopsia is present on the left eye. CN II-XII grossly intact, otherwise. No drift of the upper or lower extremities. No aphasia. Patient is alert and oriented to person, place, and time. Sensation to light touch is intact in all four extremities.  Musculoskeletal: Strength is 5/5 throughout. Intact ROM of all four extremities.  Psych: Normal affect. Normal tone of voice.   EKG: personally reviewed my interpretation is sinus bradycardia at a rate of ~56bpm with bifascicular block. Q waves present in leads V2 and V3, suggestive of prior infarct. T wave inversions present in aVR and V1-V6, consistent with anterolateral ischemia, new from previous EKG 01/13/20.  CXR: personally reviewed my interpretation is dialysis catheter in place. No acute cardiopulmonary process.  MR Brain wo Contrast:  Acute white matter infarcts bilaterally.  Moderate atrophy and advanced chronic microvascular ischemic changes in the white matter.  Multiple areas of chronic microhemorrhage in both cerebral hemispheres. Correlate with hypertension history.  Electronically Signed   By: Franchot Gallo M.D.   On: 02/17/2020 19:29  Assessment & Plan by Problem: Active Problems:   ESRD (end stage renal disease) ([place])   CVA (cerebral vascular accident) ([place])   PAD (peripheral artery disease) ([place])  Acute CVA Patient presented with three 1-minute episodes of right-sided weakness and altered consciousness that occurred intermittently around 9:00pm 02/16/20. MR brain without contrast showed small areas of acute white matter infarct in the right frontal lobe and left parietal lobe with scattered areas of chronic microhemorrhage in both cerebral hemispheres and right external capsule and chronic microvascular ischemic white matter changes. Neurology consulted  and believe findings are consistent with amyloid angiopathy and chronic hypertensive arteriopathy.  - Neurology consulted, appreciate their recommendations: - HgbA1c - Fasting lipid panel - ECHO - PT/OT consult, speech consult - Carotid dopplers  - ASA 81mg  and clopidogrel 75mg  daily  - risk factor modification  - telemetry monitoring - frequent neuro checks   ESRD on HD TTS Patient has recently been started on dialysis. Last dialysis session was Tuesday 02/16/20. He continues to make urine although he hasn't had much to  eat or drink today. He appears euvolemic on exam with minimal LE edema and clear lungs. He takes Lasix 80mg  BID at home.  - Continue HD TTS - Continue home sevelamer carbonate 800mg  TID - Patient will need morning nephrology consult - Held Lasix for permissive hypertension   Peripheral Artery Disease Patient has a history of PAD and presented to [facility] [place] with ischemic ulceration of his left great toe and underwent arteriogram with atherectomy and angioplasty of the left posterior tibial artery 02/09/20. Girlfriend reports worsening of his necrotic ischemic ulcer on his left great toe and patient reports increased pain in his LLE, specifically with ambulation but also to palpation at rest. On exam, he has diminished pulses of the bilateral LE's. DP pulse unable to be palpated on physical exam of the left lower extremity. Doppler ultrasound of the LLE with faint DP pulse on the left.  - Consulting Vascular, appreciate their recommendations - Patient states he has outpatient follow-up with vascular for RLE arteriogram 02/26/20  Biventricular Fascicular Block Patient has biventricular fascicular block on EKG that has progressed from previous EKG's from June and July of this year. He also has first degree AV block on EKG.  - Check ECHO  HFrEF Last ECHO 12/24/19 showed EF 35-40% with global hypokinesis and mild dilation of the LV and mild LVH, Grade  II DD. Moderately elevated pulmonary systolic pressures with moderate MR. Patient clinically appears euvolemic on examination today.   - Ordered repeat ECHO in setting of biventricular fascicular block - Strict I&O  - Daily weights   Hypertension Blood pressures ranging from 488'Q-916'X systolic. - Neurology consulted, appreciate their recommendations - For now, allow permissive hypertension <220/157mmHg   Hyperlipidemia - Continue Atorvastatin 80mg  daily   Chronic Normocytic Anemia Hemoglobin 8.6, MCV 91.3. Stable and likely secondary to anemia of chronic disease / ESRD. - Continue to monitor CBC   BPH Patient is still able to make urine despite HD.  - Continue tamsulosin 0.4mg  daily  - Continue finasteride 5mg  daily   Diet: Renal with fluid restriction to 1.2L/day  VTE: Heparin 5000U SQ TID  IVF: none Code: Full Code  Of note: Patient had outpatient appointment with palliative care 7/30 but cannot see note.   Dispo: Admit patient to Inpatient with expected length of stay greater than 2 midnights.  Signed: Jeralyn Bennett, MD 02/18/2020, 5:59 AM  Pager: [phone] On Call 7p-7a pager: [phone]

## 2020-02-17 NOTE — ED Notes (Signed)
RN assumed care of pt, pt resting on stretcher with wife at bedside, pt a/o x4, denies any needs at this time, pt appears to be in NAD at this moment , will continue to monitor

## 2020-02-18 ENCOUNTER — Inpatient Hospital Stay (HOSPITAL_COMMUNITY): Payer: Medicare PPO

## 2020-02-18 DIAGNOSIS — Z8673 Personal history of transient ischemic attack (TIA), and cerebral infarction without residual deficits: Secondary | ICD-10-CM

## 2020-02-18 DIAGNOSIS — E785 Hyperlipidemia, unspecified: Secondary | ICD-10-CM

## 2020-02-18 DIAGNOSIS — I951 Orthostatic hypotension: Secondary | ICD-10-CM | POA: Diagnosis present

## 2020-02-18 DIAGNOSIS — I4469 Other fascicular block: Secondary | ICD-10-CM

## 2020-02-18 DIAGNOSIS — I739 Peripheral vascular disease, unspecified: Secondary | ICD-10-CM

## 2020-02-18 DIAGNOSIS — R569 Unspecified convulsions: Secondary | ICD-10-CM

## 2020-02-18 DIAGNOSIS — N400 Enlarged prostate without lower urinary tract symptoms: Secondary | ICD-10-CM

## 2020-02-18 DIAGNOSIS — Z992 Dependence on renal dialysis: Secondary | ICD-10-CM

## 2020-02-18 DIAGNOSIS — R52 Pain, unspecified: Secondary | ICD-10-CM

## 2020-02-18 DIAGNOSIS — I6389 Other cerebral infarction: Secondary | ICD-10-CM

## 2020-02-18 DIAGNOSIS — N186 End stage renal disease: Secondary | ICD-10-CM

## 2020-02-18 DIAGNOSIS — D649 Anemia, unspecified: Secondary | ICD-10-CM

## 2020-02-18 DIAGNOSIS — I132 Hypertensive heart and chronic kidney disease with heart failure and with stage 5 chronic kidney disease, or end stage renal disease: Secondary | ICD-10-CM

## 2020-02-18 DIAGNOSIS — I5020 Unspecified systolic (congestive) heart failure: Secondary | ICD-10-CM

## 2020-02-18 LAB — CBC WITH DIFFERENTIAL/PLATELET
Abs Immature Granulocytes: 0.02 10*3/uL (ref 0.00–0.07)
Basophils Absolute: 0 10*3/uL (ref 0.0–0.1)
Basophils Relative: 0 %
Eosinophils Absolute: 0 10*3/uL (ref 0.0–0.5)
Eosinophils Relative: 1 %
HCT: 27.2 % — ABNORMAL LOW (ref 39.0–52.0)
Hemoglobin: 8.6 g/dL — ABNORMAL LOW (ref 13.0–17.0)
Immature Granulocytes: 0 %
Lymphocytes Relative: 23 %
Lymphs Abs: 1.9 10*3/uL (ref 0.7–4.0)
MCH: 28.9 pg (ref 26.0–34.0)
MCHC: 31.6 g/dL (ref 30.0–36.0)
MCV: 91.3 fL (ref 80.0–100.0)
Monocytes Absolute: 0.7 10*3/uL (ref 0.1–1.0)
Monocytes Relative: 8 %
Neutro Abs: 5.8 10*3/uL (ref 1.7–7.7)
Neutrophils Relative %: 68 %
Platelets: 309 10*3/uL (ref 150–400)
RBC: 2.98 MIL/uL — ABNORMAL LOW (ref 4.22–5.81)
RDW: 14 % (ref 11.5–15.5)
WBC: 8.5 10*3/uL (ref 4.0–10.5)
nRBC: 0 % (ref 0.0–0.2)

## 2020-02-18 LAB — COMPREHENSIVE METABOLIC PANEL
ALT: 9 U/L (ref 0–44)
AST: 14 U/L — ABNORMAL LOW (ref 15–41)
Albumin: 2.5 g/dL — ABNORMAL LOW (ref 3.5–5.0)
Alkaline Phosphatase: 83 U/L (ref 38–126)
Anion gap: 13 (ref 5–15)
BUN: 31 mg/dL — ABNORMAL HIGH (ref 8–23)
CO2: 29 mmol/L (ref 22–32)
Calcium: 8.7 mg/dL — ABNORMAL LOW (ref 8.9–10.3)
Chloride: 95 mmol/L — ABNORMAL LOW (ref 98–111)
Creatinine, Ser: 3.55 mg/dL — ABNORMAL HIGH (ref 0.61–1.24)
GFR calc Af Amer: 19 mL/min — ABNORMAL LOW (ref >60)
GFR calc non Af Amer: 17 mL/min — ABNORMAL LOW (ref >60)
Glucose, Bld: 272 mg/dL — ABNORMAL HIGH (ref 70–99)
Potassium: 3.6 mmol/L (ref 3.5–5.1)
Sodium: 137 mmol/L (ref 135–145)
Total Bilirubin: 0.2 mg/dL — ABNORMAL LOW (ref 0.3–1.2)
Total Protein: 6.6 g/dL (ref 6.5–8.1)

## 2020-02-18 LAB — MAGNESIUM: Magnesium: 1.9 mg/dL (ref 1.7–2.4)

## 2020-02-18 LAB — LIPID PANEL
Cholesterol: 128 mg/dL (ref 0–200)
HDL: 62 mg/dL (ref >40)
LDL Cholesterol: 61 mg/dL (ref 0–99)
Total CHOL/HDL Ratio: 2.1 RATIO
Triglycerides: 27 mg/dL (ref <150)
VLDL: 5 mg/dL (ref 0–40)

## 2020-02-18 LAB — PHOSPHORUS: Phosphorus: 5.7 mg/dL — ABNORMAL HIGH (ref 2.5–4.6)

## 2020-02-18 LAB — GLUCOSE, CAPILLARY
Glucose-Capillary: 145 mg/dL — ABNORMAL HIGH (ref 70–99)
Glucose-Capillary: 146 mg/dL — ABNORMAL HIGH (ref 70–99)

## 2020-02-18 LAB — HEMOGLOBIN A1C
Hgb A1c MFr Bld: 6.4 % — ABNORMAL HIGH (ref 4.8–5.6)
Mean Plasma Glucose: 136.98 mg/dL

## 2020-02-18 LAB — ECHOCARDIOGRAM COMPLETE
Area-P 1/2: 2.87 cm2
S' Lateral: 2.5 cm
Weight: 2504.43 oz

## 2020-02-18 MED ORDER — INSULIN ASPART 100 UNIT/ML ~~LOC~~ SOLN: 0.0000 [IU] | Freq: Every day | SUBCUTANEOUS | Status: DC

## 2020-02-18 MED ORDER — CHLORHEXIDINE GLUCONATE CLOTH 2 % EX PADS
6.0000 | MEDICATED_PAD | Freq: Every day | CUTANEOUS | Status: DC
  Administered 2020-02-18: 6 via TOPICAL

## 2020-02-18 MED ORDER — INSULIN ASPART 100 UNIT/ML ~~LOC~~ SOLN
0.0000 [IU] | Freq: Three times a day (TID) | SUBCUTANEOUS | Status: DC
  Administered 2020-02-18 (×2): 1 [IU] via SUBCUTANEOUS
  Administered 2020-02-19: 3 [IU] via SUBCUTANEOUS
  Administered 2020-02-19: 2 [IU] via SUBCUTANEOUS
  Administered 2020-02-19: 5 [IU] via SUBCUTANEOUS
  Administered 2020-02-20: 3 [IU] via SUBCUTANEOUS
  Administered 2020-02-20: 2 [IU] via SUBCUTANEOUS
  Administered 2020-02-20 – 2020-02-21 (×2): 3 [IU] via SUBCUTANEOUS
  Administered 2020-02-21: 2 [IU] via SUBCUTANEOUS

## 2020-02-18 MED ORDER — CHLORHEXIDINE GLUCONATE CLOTH 2 % EX PADS
6.0000 | MEDICATED_PAD | Freq: Every day | CUTANEOUS | Status: DC
  Administered 2020-02-20 – 2020-02-21 (×2): 6 via TOPICAL

## 2020-02-18 MED ORDER — HEPARIN SODIUM (PORCINE) 1000 UNIT/ML IJ SOLN
INTRAMUSCULAR | Status: AC
  Filled 2020-02-18: qty 4

## 2020-02-18 NOTE — Evaluation (Signed)
Occupational Therapy Evaluation Patient Details Name: Dennis Macias MRN: [id number] DOB: 1950/08/08 Today's Date: 02/18/2020    History of Present Illness Pt is a 69 y/o male with PMH of L ICA CVA, NIDDM type II with retinopathy, CKD on hemodialysis, nephrotic syndrome, combined systolic and diastolic CHF, CAD w/ NSTEMI, HTN, HLD, and protein malnutrition who was brought to the ED by EMS after experiencing three distinct short episodes of R sided acute weakness with altered mental status at home following dialysis. MRI reveals acute white matter infarcts bilaterally as well as chronic microhemorrhages.   Clinical Impression   PTA patient independent with ADLs, using RW for mobility, significant other assisted with IADLs.  Admitted for above and limited by problem list below, including generalized weakness, impaired balance and decreased activity tolerance.  He is alert and oriented, follows commands and engages appropriately with therapist. Limited eval, as patient declines functional mobility in room.  Completes bed mobility with supervision, sit to stand from EOB with min guard (with side steps towards [facility]) using RW, and requires min assist to supervision for ADls.  Believe he will benefit from continued OT services while admitted and after dc at [facility] level to optimize independence, safety and return to PLOF with ADLs and mobility.  Recommend initial 24/7 assist for safety.      Follow Up Recommendations  Home health OT;Supervision/Assistance - 24 hour (initally)    Equipment Recommendations  None recommended by OT    Recommendations for Other Services       Precautions / Restrictions Precautions Precautions: Fall Precaution Comments: blind R eye Restrictions Weight Bearing Restrictions: No      Mobility Bed Mobility Overal bed mobility: Needs Assistance Bed Mobility: Supine to Sit;Sit to Supine     Supine to sit: Supervision Sit to supine: Supervision   General bed mobility  comments: increased time and effort with HOB elevated but no physical assist required  Transfers Overall transfer level: Needs assistance Equipment used: Rolling walker (2 wheeled) Transfers: Sit to/from Stand Sit to Stand: Min guard         General transfer comment: min guard with increased time to power up and steady using RW    Balance Overall balance assessment: Needs assistance Sitting-balance support: No upper extremity supported;Feet supported Sitting balance-Leahy Scale: Fair     Standing balance support: Bilateral upper extremity supported;During functional activity Standing balance-Leahy Scale: Poor Standing balance comment: relies on UE support                           ADL either performed or assessed with clinical judgement   ADL Overall ADL's : Needs assistance/impaired     Grooming: Set up;Sitting   Upper Body Bathing: Set up;Sitting   Lower Body Bathing: Minimal assistance;Sit to/from stand   Upper Body Dressing : Set up;Sitting   Lower Body Dressing: Minimal assistance;Sit to/from Health and safety inspector Details (indicate cue type and reason): simulated side stepping towards HOB, min guard assist          Functional mobility during ADLs: Minimal assistance;Min guard;Rolling walker General ADL Comments: patient limited by decreased activity tolerance, impaired balance, generalized weakness      Vision Baseline Vision/History: Legally blind (R eye) Patient Visual Report: No change from baseline Additional Comments: appears [facility]      Perception     Praxis      Pertinent Vitals/Pain Pain Assessment: No/denies pain     Hand Dominance  Right   Extremity/Trunk Assessment Upper Extremity Assessment Upper Extremity Assessment: Generalized weakness   Lower Extremity Assessment Lower Extremity Assessment: Defer to PT evaluation   Cervical / Trunk Assessment Cervical / Trunk Assessment: Normal   Communication  Communication Communication: HOH   Cognition Arousal/Alertness: Awake/alert Behavior During Therapy: WFL for tasks assessed/performed Overall Cognitive Status: Within Functional Limits for tasks assessed                                 General Comments: appears WFL for limited assessment, A&O x 4, follows simple commands and engages appropraitely    General Comments  limited session, pt declining func mobility in room    Exercises     Shoulder Instructions      Home Living Family/patient expects to be discharged to:: Private residence Living Arrangements: Spouse/significant other   Type of Home: Apartment Home Access: Stairs to enter [employer] of Steps: 7 Entrance Stairs-Rails: Right Home Layout: One level     Bathroom Shower/Tub: Teacher, early years/pre: Standard     Home Equipment: Bedside commode;Wheelchair - [employer] - 2 wheels;Walker - 4 wheels;Tub bench;Cane - single point          Prior Functioning/Environment Level of Independence: Needs assistance  Gait / Transfers Assistance Needed: usually ambulates with walker or cane, limited community distances ADL's / Homemaking Assistance Needed: independent with dressing and bathing, girlfriend does cooking and cleaning   Comments: enjoys going outside and sitting        OT Problem List: Decreased strength;Decreased activity tolerance;Impaired balance (sitting and/or standing);Decreased safety awareness      OT Treatment/Interventions: Self-care/ADL training;DME and/or AE instruction;Therapeutic activities;Patient/family education;Balance training;Therapeutic exercise    OT Goals(Current goals can be found in the care plan section) Acute Rehab OT Goals Patient Stated Goal: to get home  OT Goal Formulation: With patient Time For Goal Achievement: 03/03/20 Potential to Achieve Goals: Good  OT Frequency: Min 2X/week   Barriers to D/C:            Co-evaluation               AM-PAC OT "6 Clicks" Daily Activity     Outcome Measure Help from another person eating meals?: A Little Help from another person taking care of personal grooming?: A Little Help from another person toileting, which includes using toliet, bedpan, or urinal?: A Little Help from another person bathing (including washing, rinsing, drying)?: A Little Help from another person to put on and taking off regular upper body clothing?: A Little Help from another person to put on and taking off regular lower body clothing?: A Little 6 Click Score: 18   End of Session Nurse Communication: Mobility status;Other (comment) (f/u with lunch tray)  Activity Tolerance: Patient tolerated treatment well Patient left: in bed;with call bell/phone within reach;with bed alarm set  OT Visit Diagnosis: Other abnormalities of gait and mobility (R26.89);Muscle weakness (generalized) (M62.81)                Time: 4132-4401 OT Time Calculation (min): 9 min Charges:  OT General Charges $OT Visit: 1 Visit OT Evaluation $OT Eval Moderate Complexity: 1 Mod  Jolaine Artist, OT Acute Rehabilitation Services Pager [phone] Office [phone]   Delight Stare 02/18/2020, 2:48 PM

## 2020-02-18 NOTE — Procedures (Signed)
Patient Name: Dennis Macias  MRN: [id number]  Epilepsy Attending: Lora Havens  Referring Physician/Provider: Dr. Molli Hazard Date: 02/18/2020 Duration: 26.26 mins  Patient history: 69 year old male with 3 episodes of weakness with altered mental status at home.  EEG evaluate for seizures.  Level of alertness: Awake, asleep  AEDs during EEG study: None  Technical aspects: This EEG study was done with scalp electrodes positioned according to the 10-20 International system of electrode placement. Electrical activity was acquired at a sampling rate of 500Hz  and reviewed with a high frequency filter of 70Hz  and a low frequency filter of 1Hz . EEG data were recorded continuously and digitally stored.   Description: The posterior dominant rhythm consists of 7.5 Hz activity of moderate voltage (25-35 uV) seen predominantly in posterior head regions, symmetric and reactive to eye opening and eye closing. Sleep was characterized by vertex waves, sleep spindles (12 to 14 Hz), maximal frontocentral region.  EEG showed continuous generalized 5 to 6 Hz theta slowing. Hyperventilation and photic stimulation were not performed.     ABNORMALITY -Continuous slow, generalized  IMPRESSION: This study is suggestive of mild diffuse encephalopathy, nonspecific etiology. No seizures or epileptiform discharges were seen throughout the recording.  [name] [name]

## 2020-02-18 NOTE — Progress Notes (Signed)
EEG Completed; Results Pending  

## 2020-02-18 NOTE — Progress Notes (Signed)
VASCULAR LAB    Carotid duplex completed.    Preliminary report:  See CV proc for preliminary results.  Carlyann Placide, RVT 02/18/2020, 8:44 AM

## 2020-02-18 NOTE — Evaluation (Signed)
Physical Therapy Evaluation Patient Details Name: Dennis Macias MRN: [id number] DOB: [birth year] Today's Date: 02/18/2020   History of Present Illness  Pt is a 69 y/o male with PMH of L ICA CVA, NIDDM type II with retinopathy, CKD on hemodialysis, nephrotic syndrome, combined systolic and diastolic CHF, CAD w/ NSTEMI, HTN, HLD, and protein malnutrition who was brought to the ED by EMS after experiencing three distinct short episodes of R sided acute weakness with altered mental status at home following dialysis. MRI reveals acute white matter infarcts bilaterally as well as chronic microhemorrhages.  Clinical Impression  Patient presents with decreased mobility due to weakness and decreased balance from his baseline.  Does report using AD at home with girlfriend assist for IADL's.  Currently minguard for safety with most mobility with RW.  Feel he will benefit from skilled PT in the acute setting and follow up HHPT at d/c.  Orthostatics taken this session with results as below.   Orthostatic VS for the past 24 hrs (Last 3 readings):  BP- Lying Pulse- Lying BP- Sitting Pulse- Sitting BP- Standing at 0 minutes Pulse- Standing at 0 minutes BP- Standing at 3 minutes Pulse- Standing at 3 minutes  02/18/20 1200 148/71 64 109/71 74 106/60 68 95/60 72      Follow Up Recommendations Home health PT    Equipment Recommendations  None recommended by PT    Recommendations for Other Services       Precautions / Restrictions Precautions Precautions: Fall Precaution Comments: blind R eye Restrictions Weight Bearing Restrictions: No      Mobility  Bed Mobility Overal bed mobility: Needs Assistance Bed Mobility: Supine to Sit;Sit to Supine     Supine to sit: Supervision Sit to supine: Supervision   General bed mobility comments: increased time and effort with HOB elevated but no physical assist required  Transfers Overall transfer level: Needs assistance Equipment used: Rolling walker (2  wheeled) Transfers: Sit to/from [facility] Sit to Stand: Min guard Stand pivot transfers: Min assist;Min guard       General transfer comment: assist for balance to stand, pt using RW for balance, then to chair at bedside to allow for linen change no device with min A, used walker to pivot back to bed with again assist for balance  Ambulation/Gait             General Gait Details: declined ambulation this session  Stairs            Wheelchair Mobility    Modified Rankin (Stroke Patients Only) Modified Rankin (Stroke Patients Only) Pre-Morbid Rankin Score: Slight disability Modified Rankin: Moderately severe disability     Balance Overall balance assessment: Needs assistance Sitting-balance support: No upper extremity supported Sitting balance-Leahy Scale: Good     Standing balance support: Bilateral upper extremity supported Standing balance-Leahy Scale: Poor Standing balance comment: relies on UE support                             Pertinent Vitals/Pain Pain Assessment: No/denies pain    Home Living Family/patient expects to be discharged to:: Private residence Living Arrangements: Spouse/significant other Available Help at Discharge: Family;Available 24 hours/day Type of Home: Apartment Home Access: Stairs to enter Entrance Stairs-Rails: Right Entrance Stairs-Number of Steps: 7 Home Layout: One level Home Equipment: Bedside commode;Wheelchair - [employer] - 2 wheels;Walker - 4 wheels;Tub bench;Cane - single point      Prior Function Level of Independence: Needs  assistance   Gait / Transfers Assistance Needed: usually ambulates with walker or cane, limited community distances  ADL's / Homemaking Assistance Needed: independent with dressing and bathing, girlfriend does cooking and cleaning  Comments: enjoys going outside and sitting     Hand Dominance   Dominant Hand: Right    Extremity/Trunk Assessment   Upper  Extremity Assessment Upper Extremity Assessment: Defer to OT evaluation    Lower Extremity Assessment Lower Extremity Assessment: Overall WFL for tasks assessed    Cervical / Trunk Assessment Cervical / Trunk Assessment: Normal  Communication   Communication: HOH  Cognition Arousal/Alertness: Awake/alert Behavior During Therapy: WFL for tasks assessed/performed Overall Cognitive Status: Within Functional Limits for tasks assessed                                 General Comments: appears [facility] for limited assessment, A&O x 4, follows simple commands and engages appropraitely       General Comments General comments (skin integrity, edema, etc.): limited session, pt declining func mobility in room    Exercises     Assessment/Plan    PT Assessment Patient needs continued PT services  PT Problem List Decreased activity tolerance;Decreased balance;Decreased mobility       PT Treatment Interventions Gait training;Therapeutic exercise;Patient/family education;Therapeutic activities;Stair training;Balance training;Functional mobility training;DME instruction    PT Goals (Current goals can be found in the Care Plan section)  Acute Rehab PT Goals Patient Stated Goal: to get home  PT Goal Formulation: With patient Time For Goal Achievement: 03/03/20 Potential to Achieve Goals: Good    Frequency Min 4X/week   Barriers to discharge        Co-evaluation               AM-PAC PT "6 Clicks" Mobility  Outcome Measure Help needed turning from your back to your side while in a flat bed without using bedrails?: A Little Help needed moving from lying on your back to sitting on the side of a flat bed without using bedrails?: A Little Help needed moving to and from a bed to a chair (including a wheelchair)?: A Little Help needed standing up from a chair using your arms (e.g., wheelchair or bedside chair)?: A Little Help needed to walk in hospital room?: A Little Help  needed climbing 3-5 steps with a railing? : A Little 6 Click Score: 18    End of Session   Activity Tolerance: Patient tolerated treatment well Patient left: in bed;with call bell/phone within reach;with bed alarm set   PT Visit Diagnosis: Other abnormalities of gait and mobility (R26.89)    Time: 4098-1191 PT Time Calculation (min) (ACUTE ONLY): 28 min   Charges:   PT Evaluation $PT Eval Moderate Complexity: 1 Mod PT Treatments $Therapeutic Activity: 8-22 mins        Magda Kiel, PT Acute Rehabilitation Services Pager:[phone] Office:[phone] 02/18/2020   Reginia Naas 02/18/2020, 4:01 PM

## 2020-02-18 NOTE — Progress Notes (Signed)
  Echocardiogram 2D Echocardiogram has been performed.  Dennis Macias 02/18/2020, 11:43 AM

## 2020-02-18 NOTE — Progress Notes (Signed)
    Subjective  -   Patient was admitted with acute stroke.  He feels his symptoms have nearly resolved.  There was concern over left leg ischemia.  He was evaluated by Dr. Donnetta Hutching last night with brisk posterior tibial Doppler signal   Physical Exam:  Improved appearance of the ulcer on his left great toe. Brisk posterior tibial Doppler signal Abdomen is soft and nontender.       Assessment/Plan:    The patient was admitted with an acute stroke.  There was concern over ischemia to his left leg which I recently intervened on for a left great toe ulcer.  He has a brisk posterior tibial Doppler signal.  No vascular intervention is necessary at this time.  He will keep his previously scheduled follow-up in our office in the next few weeks.  I will sign off.  Dennis Macias 02/18/2020 12:56 PM --  Vitals:   02/18/20 0332 02/18/20 0742  BP: 114/81 (!) 176/95  Pulse: 67 63  Resp: 19 18  Temp: 98.2 F (36.8 C) 97.8 F (36.6 C)  SpO2: 99% 98%    Intake/Output Summary (Last 24 hours) at 02/18/2020 1256 Last data filed at 02/18/2020 0600 Gross per 24 hour  Intake 60 ml  Output 200 ml  Net -140 ml     Laboratory CBC    Component Value Date/Time   WBC 8.5 02/18/2020 0152   HGB 8.6 (L) 02/18/2020 0152   HGB 9.6 (L) 12/10/2019 1439   HCT 27.2 (L) 02/18/2020 0152   HCT 28.4 (L) 12/10/2019 1439   PLT 309 02/18/2020 0152   PLT 285 12/10/2019 1439    BMET    Component Value Date/Time   NA 137 02/18/2020 0152   NA 137 12/10/2019 1439   K 3.6 02/18/2020 0152   CL 95 (L) 02/18/2020 0152   CO2 29 02/18/2020 0152   GLUCOSE 272 (H) 02/18/2020 0152   BUN 31 (H) 02/18/2020 0152   BUN 80 (HH) 12/10/2019 1439   CREATININE 3.55 (H) 02/18/2020 0152   CALCIUM 8.7 (L) 02/18/2020 0152   GFRNONAA 17 (L) 02/18/2020 0152   GFRAA 19 (L) 02/18/2020 0152    COAG Lab Results  Component Value Date   INR 1.5 (H) 02/17/2020   INR 1.9 (H) 01/14/2020   INR 1.5 (H) 11/14/2019   No  results found for: PTT  Antibiotics Anti-infectives (From admission, onward)   None       V. Leia Alf, M.D., [facility] [place] Office: [phone] Pager:  [phone]

## 2020-02-18 NOTE — Consult Note (Signed)
[facility] Renal Consultation Note    Indication for Consultation:  Management of ESRD/hemodialysis, anemia, hypertension/volume, and secondary hyperparathyroidism. PCP:  HPI: Dennis Macias is a 69 y.o. male with ESRD, T2DM, Combined HF, CAD (Hx NSTEMI), HL, HTN, and Hx L ICA CVA who was admitted with AMS -> found to have bilateral stokes.   Symptoms started 8/10 evening with 3 episodes throughout the evening of R sided weakness and non-responsiveness which worsened each time prior to returning to baseline. EMS called and brought to ED. In the ED, vitals were normal. Labs showed K 3.6, Glu 272, BUN 16, Cr 2.21, WBC 9.6, Hgb 8.5. CXR clear. MRI brain showed acute bilateral white matter infarcts. MRA without large vessel occlusions. EEG this morning showed no seizures. Echo pending.  Has a chronic L toe wound - underwent RLE arteriogram + angioplasty 8/3 by [facility] with improved doppler flow to L foot now.  This morning - he is feeling fine. No CP, dyspnea, headache, vision changes, N/V, diarrhea. No fever or chills. Having some mild L leg pain.  Dialyzes on TTS sched at [facility] His last HD was 8/10 which he completed in entirety.  Past Medical History:  Diagnosis Date  . Anemia   . Arthritis    past hx   . Blindness    right eye  . Cardiorenal syndrome   . Cataract    removed both eyes  . Dehydration   . Diabetes ([place])   . Glaucoma   . History of CVA (cerebrovascular accident) 09/13/2015  . History of stroke 09/13/2015  . History of urinary retention   . Hyperlipidemia   . Hypertension   . NSTEMI (non-ST elevated myocardial infarction) ([place])   . Pernicious anemia 02/24/2018  . S/P TURP   . Stroke [facility])    2017- March  . Syncope 11/2019  . Tachycardia 08/26/2017  . Tubular adenoma of colon 02/2017  . Weight loss, non-intentional 08/26/2017   10 lbs between 6/18 & 2/19   Past Surgical History:  Procedure Laterality Date  . ABDOMINAL AORTOGRAM W/LOWER EXTREMITY  N/A 02/09/2020   Procedure: ABDOMINAL AORTOGRAM W/LOWER EXTREMITY;  Surgeon: Serafina Mitchell, MD;  Location: [place] [facility];  Service: Cardiovascular;  Laterality: N/A;  . AV FISTULA PLACEMENT Right 01/19/2020   Procedure: BRACHIOCEPHALIC ARTERIOVENOUS (AV) FISTULA CREATION;  Surgeon: Waynetta Sandy, MD;  Location: [place];  Service: Vascular;  Laterality: Right;  . CATARACT EXTRACTION, BILATERAL    . COLONOSCOPY    . IR FLUORO GUIDE CV LINE RIGHT  01/14/2020  . IR THORACENTESIS ASP PLEURAL SPACE W/IMG GUIDE  09/21/2019  . IR THORACENTESIS ASP PLEURAL SPACE W/IMG GUIDE  10/16/2019  . IR US GUIDE VASC ACCESS RIGHT  01/15/2020  . LEFT HEART CATH AND CORONARY ANGIOGRAPHY N/A 11/19/2019   Procedure: LEFT HEART CATH AND CORONARY ANGIOGRAPHY;  Surgeon: Jettie Booze, MD;  Location: [place] [facility];  Service: Cardiovascular;  Laterality: N/A;  . PERIPHERAL VASCULAR INTERVENTION Left 02/09/2020   Procedure: PERIPHERAL VASCULAR INTERVENTION;  Surgeon: Serafina Mitchell, MD;  Location: [place] [facility];  Service: Cardiovascular;  Laterality: Left;  . POLYPECTOMY    . REFRACTIVE SURGERY  10/2017  . TEE WITHOUT CARDIOVERSION N/A 09/14/2015   Procedure: TRANSESOPHAGEAL ECHOCARDIOGRAM (TEE);  Surgeon: Larey Dresser, MD;  Location: [place];  Service: Cardiovascular;  Laterality: N/A;  . TRANSURETHRAL RESECTION OF PROSTATE N/A 10/20/2019   Procedure: TRANSURETHRAL RESECTION OF THE PROSTATE (TURP);  Surgeon: Irine Seal,  MD;  Location: [facility];  Service: Urology;  Laterality: N/A;  . UPPER GASTROINTESTINAL ENDOSCOPY     Family History  Problem Relation Age of Onset  . Hypertension Mother   . Hyperlipidemia Mother   . Hyperlipidemia Father   . Colon cancer Neg Hx   . Colon polyps Neg Hx   . Esophageal cancer Neg Hx   . Rectal cancer Neg Hx   . Stomach cancer Neg Hx    Social History:  reports that he has quit smoking. He quit smokeless tobacco use about 41 years ago.  His smokeless  tobacco use included chew. He reports that he does not drink alcohol and does not use drugs.  ROS: As per HPI otherwise negative.  Physical Exam: Vitals:   02/18/20 0144 02/18/20 0314 02/18/20 0332 02/18/20 0742  BP: (!) 174/82 (!) 157/59 114/81 (!) 176/95  Pulse: 90 63 67 63  Resp: 17 17 19 18   Temp: 98.6 F (37 C) 98.2 F (36.8 C) 98.2 F (36.8 C) 97.8 F (36.6 C)  TempSrc: Oral Oral Oral Oral  SpO2: 100% 100% 99% 98%  Weight: 71 kg        General: Well developed, well nourished, in no acute distress. Head: Normocephalic, atraumatic, R eye with cloudy lens. Neck: Supple without lymphadenopathy/masses. JVD not elevated. Lungs: Clear bilaterally to auscultation without wheezes, rales, or rhonchi.  Heart: RRR with normal S1, S2. No murmurs, rubs, or gallops appreciated. Abdomen: Soft, non-tender, non-distended with normoactive bowel sounds. No rebound/guarding. No obvious abdominal masses. Musculoskeletal:  Strength and tone appear normal for age. Lower extremities: 1+ LE edema; L toe wound not examined Neuro: Alert and oriented X 3. Moves all extremities spontaneously. Psych:  Responds to questions appropriately with a normal affect. Dialysis Access: [facility] R chest + R AVF + thrill  Allergies  Allergen Reactions  . Cefepime Other (See Comments)    Pt had BAD encephalopathy from Cefepime   Prior to Admission medications   Medication Sig Start Date End Date Taking? Authorizing Provider  acetaminophen (TYLENOL) 500 MG tablet Take 500 mg by mouth every 6 (six) hours as needed for moderate pain or headache.   Yes [provider]  amLODipine (NORVASC) 10 MG tablet Take 1 tablet (10 mg total) by mouth daily. Patient taking differently: Take 10 mg by mouth See admin instructions. Take 10 mg by mouth in the morning on Sun/Mon/Wed/Fri and in the evening on Tues/Thurs/Sat 12/27/19  Yes Seawell, Jaimie A, DO  aspirin EC 81 MG tablet Take 1 tablet (81 mg total) by mouth daily.  10/22/19  Yes Eugenie Filler, MD  atorvastatin (LIPITOR) 80 MG tablet Take 1 tablet (80 mg total) by mouth at bedtime. IM program 12/03/19  Yes Angiulli, Lavon Paganini, PA-C  carvedilol (COREG) 25 MG tablet Take 1 tablet (25 mg total) by mouth 2 (two) times daily with a meal. Patient taking differently: Take 25 mg by mouth See admin instructions. Take 25 mg by mouth two times a day on Sun/Mon/Wed/Fri and 25 mg in the evening ONLY on Tues/Thurs/Sat 01/04/20  Yes [name], Rylee, MD  clopidogrel (PLAVIX) 75 MG tablet TAKE 1 TABLET BY MOUTH ONCE DAILY. Patient taking differently: Take 75 mg by mouth daily.  01/26/20  Yes Bloomfield, Carley D, DO  dorzolamide-timolol (COSOPT) 22.3-6.8 MG/ML ophthalmic solution Place 1 drop into the right eye 2 (two) times daily. 12/03/19  Yes Angiulli, Lavon Paganini, PA-C  finasteride (PROSCAR) 5 MG tablet Take 1 tablet (5 mg total) by  mouth daily. 12/03/19  Yes Angiulli, Lavon Paganini, PA-C  furosemide (LASIX) 80 MG tablet TAKE 1 TABLET (80 MG TOTAL) BY MOUTH 2 (TWO) TIMES DAILY. Patient taking differently: Take 80 mg by mouth See admin instructions. Take 80 mg by mouth in the morning and at 4 PM daily 01/26/20  Yes Bloomfield, Carley D, DO  hydrALAZINE (APRESOLINE) 25 MG tablet Take 25 mg by mouth See admin instructions. Take 25 mg by mouth two times a day on Sun/Mon/Wed/Fri and in the evening ONLY on Tues/Thurs/Sat   Yes [provider]  isosorbide mononitrate (IMDUR) 60 MG 24 hr tablet TAKE 1 TABLET (60 MG TOTAL) BY MOUTH DAILY. Patient taking differently: Take 60 mg by mouth See admin instructions. Take 60 mg by mouth in the morning on Sun/Mon/Wed/Fri and in the evening on Tues/Thurs/Sat 01/26/20 02/25/20 Yes Bloomfield, Carley D, DO  polyethylene glycol (MIRALAX / GLYCOLAX) 17 g packet Take 17 g by mouth daily as needed for moderate constipation (MIX AND DRINK).    Yes [provider]  sevelamer carbonate (RENVELA) 800 MG tablet Take 800 mg by mouth See admin  instructions. Take 800 mg by mouth three times a day with food on Sun/Mon/Wed/Fri and two times a day on Tues/Thurs/Sat   Yes [provider]  tamsulosin (FLOMAX) 0.4 MG CAPS capsule Take 1 capsule (0.4 mg total) by mouth daily after supper. 12/03/19  Yes Angiulli, Lavon Paganini, PA-C  vitamin B-12 (CYANOCOBALAMIN) 1000 MCG tablet Take 1 tablet (1,000 mcg total) by mouth daily. 12/03/19  Yes Angiulli, Lavon Paganini, PA-C  Accu-Chek FastClix Lancets MISC Check blood sugar up to 7 times a week as instructed 12/03/19   Angiulli, Lavon Paganini, PA-C  Blood Glucose Monitoring Suppl (ACCU-CHEK GUIDE) w/Device KIT 1 each by Does not apply route daily. Check blood sugar as instructed up to 7 times a week 12/03/19   Angiulli, Lavon Paganini, PA-C  glucose blood (ACCU-CHEK GUIDE) test strip Check blood sugar up to 7 times a week as instructed 12/03/19   Angiulli, Lavon Paganini, PA-C  hydrALAZINE (APRESOLINE) 50 MG tablet Take 1 tablet (50 mg total) by mouth every 8 (eight) hours. Patient not taking: Reported on 02/17/2020 12/26/19   Molli Hazard A, DO   Current Facility-Administered Medications  Medication Dose Route Frequency Provider Last Rate Last Admin  . acetaminophen (TYLENOL) tablet 500 mg  500 mg Oral Q6H PRN Seawell, Jaimie A, DO      . aspirin EC tablet 81 mg  81 mg Oral Daily Seawell, Jaimie A, DO   81 mg at 02/18/20 1035  . atorvastatin (LIPITOR) tablet 80 mg  80 mg Oral QHS Seawell, Jaimie A, DO      . Chlorhexidine Gluconate Cloth 2 % PADS 6 each  6 each Topical Daily Axel Filler, MD   6 each at 02/18/20 1036  . clopidogrel (PLAVIX) tablet 75 mg  75 mg Oral Daily Seawell, Jaimie A, DO   75 mg at 02/18/20 1036  . dorzolamide-timolol (COSOPT) 22.3-6.8 MG/ML ophthalmic solution 1 drop  1 drop Right Eye BID Seawell, Jaimie A, DO   1 drop at 02/18/20 1047  . finasteride (PROSCAR) tablet 5 mg  5 mg Oral Daily Seawell, Jaimie A, DO   5 mg at 02/18/20 1036  . heparin injection 5,000 Units  5,000 Units  Subcutaneous Q8H Seawell, Jaimie A, DO   5,000 Units at 02/18/20 2706  . insulin aspart (novoLOG) injection 0-5 Units  0-5 Units Subcutaneous QHS Seawell, Jaimie A, DO      .  insulin aspart (novoLOG) injection 0-9 Units  0-9 Units Subcutaneous TID WC Seawell, Jaimie A, DO   1 Units at 02/18/20 1047  . senna-docusate (Senokot-S) tablet 1 tablet  1 tablet Oral QHS PRN Seawell, Jaimie A, DO      . [START ON 02/19/2020] sevelamer carbonate (RENVELA) tablet 800 mg  800 mg Oral 3 times per day on Sun Mon Wed Fri Seawell, Jaimie A, DO      . sevelamer carbonate (RENVELA) tablet 800 mg  800 mg Oral 2 times per day on Tue Thu Sat Axel Filler, MD   800 mg at 02/18/20 1046  . tamsulosin (FLOMAX) capsule 0.4 mg  0.4 mg Oral QPC supper Seawell, Jaimie A, DO      . vitamin B-12 (CYANOCOBALAMIN) tablet 1,000 mcg  1,000 mcg Oral Daily Seawell, Jaimie A, DO   1,000 mcg at 02/18/20 1036   Labs: Basic Metabolic Panel: Recent Labs  Lab 02/17/20 0039 02/18/20 0152  NA 137 137  K 3.6 3.6  CL 96* 95*  CO2 29 29  GLUCOSE 272* 272*  BUN 16 31*  CREATININE 2.21* 3.55*  CALCIUM 8.6* 8.7*  PHOS  --  5.7*   Liver Function Tests: Recent Labs  Lab 02/18/20 0152  AST 14*  ALT 9  ALKPHOS 83  BILITOT 0.2*  PROT 6.6  ALBUMIN 2.5*   CBC: Recent Labs  Lab 02/17/20 0039 02/18/20 0152  WBC 9.6 8.5  NEUTROABS  --  5.8  HGB 8.5* 8.6*  HCT 27.5* 27.2*  MCV 91.7 91.3  PLT 297 309   CBG: Recent Labs  Lab 02/18/20 1041  GLUCAP 145*   Studies/Results: EEG  Result Date: 02/18/2020 Lora Havens, MD     02/18/2020 11:00 AM Patient Name: Fox Salminen MRN: [id number] Epilepsy Attending: Lora Havens Referring Physician/Provider: Dr. Molli Hazard Date: 02/18/2020 Duration: 26.26 mins Patient history: 69 year old male with 3 episodes of weakness with altered mental status at home.  EEG evaluate for seizures. Level of alertness: Awake, asleep AEDs during EEG study: None Technical aspects: This EEG  study was done with scalp electrodes positioned according to the 10-20 International system of electrode placement. Electrical activity was acquired at a sampling rate of 500Hz  and reviewed with a high frequency filter of 70Hz  and a low frequency filter of 1Hz . EEG data were recorded continuously and digitally stored. Description: The posterior dominant rhythm consists of 7.5 Hz activity of moderate voltage (25-35 uV) seen predominantly in posterior head regions, symmetric and reactive to eye opening and eye closing. Sleep was characterized by vertex waves, sleep spindles (12 to 14 Hz), maximal frontocentral region.  EEG showed continuous generalized 5 to 6 Hz theta slowing. Hyperventilation and photic stimulation were not performed.   ABNORMALITY -Continuous slow, generalized IMPRESSION: This study is suggestive of mild diffuse encephalopathy, nonspecific etiology. No seizures or epileptiform discharges were seen throughout the recording. Lora Havens   DG Chest 2 View  Result Date: 02/17/2020 CLINICAL DATA:  69 year old male with syncope while using the bathroom. EXAM: CHEST - 2 VIEW COMPARISON:  Chest radiographs 01/21/2020 and earlier. FINDINGS: Upright AP and lateral views of the chest. Stable right chest dual lumen dialysis type catheter. Small bilateral pleural effusions have regressed and possibly resolved since last month. No pneumothorax or pulmonary edema. No consolidation. Stable cardiomegaly. Other mediastinal contours are within normal limits. Visualized tracheal air column is within normal limits. No acute osseous abnormality identified. Negative visible bowel gas pattern. IMPRESSION: 1. Virtually resolved  small pleural effusions since last month. Stable cardiomegaly. 2. No acute cardiopulmonary abnormality. Electronically Signed   By: Genevie Ann M.D.   On: 02/17/2020 00:53   MR ANGIO HEAD WO CONTRAST  Result Date: 02/18/2020 CLINICAL DATA:  Follow-up examination for acute stroke. EXAM: MRA  HEAD WITHOUT CONTRAST TECHNIQUE: Angiographic images of the Circle of Willis were obtained using MRA technique without intravenous contrast. COMPARISON:  Prior brain MRI from earlier the same day. FINDINGS: ANTERIOR CIRCULATION: Examination moderately degraded by motion artifact. Visualized distal cervical segments of the internal carotid arteries are patent with antegrade flow. Petrous, cavernous, and supraclinoid ICAs patent without appreciable stenosis or other abnormality. ICA termini well perfused. Left A1 segment widely patent. Right A1 hypoplastic and/or absent, accounting for the diminutive right ICA is compared to the left. Tiny 2 mm focal outpouching extending posteriorly into the right from the anterior communicating artery complex again noted, stable (series 5, image 90). Anterior cerebral arteries patent distally without stenosis. No M1 stenosis or occlusion. Normal MCA bifurcations. Distal MCA branches well perfused and symmetric. POSTERIOR CIRCULATION: Dominant right vertebral artery widely patent to the vertebrobasilar junction. Patent right PICA. Left vertebral artery hypoplastic and patent as well. Left PICA not visualized. Basilar patent to its distal aspect without appreciable stenosis. Superior cerebral arteries patent bilaterally. Both PCAs primarily supplied via the basilar well perfused to their distal aspects. Small right posterior communicating artery noted. IMPRESSION: 1. Technically limited exam due to motion artifact. 2. Negative intracranial MRA with no large vessel occlusion. No new hemodynamically significant or correctable stenosis identified. 3. 2 mm focal outpouching extending from anterior communicating artery complex, stable. Again, this could reflect a tiny aneurysm or vascular infundibulum. Electronically Signed   By: Jeannine Boga M.D.   On: 02/18/2020 00:10   MR BRAIN WO CONTRAST  Result Date: 02/17/2020 CLINICAL DATA:  Recurrent syncope EXAM: MRI HEAD WITHOUT  CONTRAST TECHNIQUE: Multiplanar, multiecho pulse sequences of the brain and surrounding structures were obtained without intravenous contrast. COMPARISON:  MRI head 11/12/2019 FINDINGS: Brain: Small areas of acute white matter infarct in the right frontal lobe and left parietal lobe. No cortical acute infarct. Moderate atrophy and extensive chronic microvascular ischemic change in the white matter. Brainstem and cerebellum intact. Scattered areas of chronic microhemorrhage in both cerebral hemispheres and the right external capsule. Negative for hydrocephalus or mass. Vascular: Normal arterial flow voids Skull and upper cervical spine: No focal skeletal lesion. Sinuses/Orbits: Mild mucosal edema paranasal sinuses. Bilateral cataract extraction. Other: None IMPRESSION: Acute white matter infarcts bilaterally. Moderate atrophy and advanced chronic microvascular ischemic changes in the white matter. Multiple areas of chronic microhemorrhage in both cerebral hemispheres. Correlate with hypertension history. Electronically Signed   By: Franchot Gallo M.D.   On: 02/17/2020 19:29   VAS US CAROTID  Result Date: 02/18/2020 Carotid Arterial Duplex Study Indications:       CVA and Weakness. Risk Factors:      Hypertension, hyperlipidemia, Diabetes, prior CVA, PAD. Comparison Study:  Prior study indicating bilateral 1-39% ICA stenosis, from                    12/03/19, is available for comparison Performing Technologist: Sharion Dove RVS  Examination Guidelines: A complete evaluation includes B-mode imaging, spectral Doppler, color Doppler, and power Doppler as needed of all accessible portions of each vessel. Bilateral testing is considered an integral part of a complete examination. Limited examinations for reoccurring indications may be performed as noted.  Right Carotid Findings: +----------+--------+--------+--------+------------------+------------------+  PSV cm/sEDV cm/sStenosisPlaque  DescriptionComments           +----------+--------+--------+--------+------------------+------------------+ CCA Prox  87      5                                 intimal thickening +----------+--------+--------+--------+------------------+------------------+ CCA Distal143     14                                intimal thickening +----------+--------+--------+--------+------------------+------------------+ ICA Prox  133     21      1-39%   calcific                             +----------+--------+--------+--------+------------------+------------------+ ICA Distal87      14                                                   +----------+--------+--------+--------+------------------+------------------+ ECA       151     1                                                    +----------+--------+--------+--------+------------------+------------------+ +----------+--------+-------+--------+-------------------+           PSV cm/sEDV cmsDescribeArm Pressure (mmHG) +----------+--------+-------+--------+-------------------+ Subclavian185                                        +----------+--------+-------+--------+-------------------+ +---------+--------+--+--------+--+ VertebralPSV cm/s50EDV cm/s11 +---------+--------+--+--------+--+  Left Carotid Findings: +----------+--------+--------+--------+------------------+------------------+           PSV cm/sEDV cm/sStenosisPlaque DescriptionComments           +----------+--------+--------+--------+------------------+------------------+ CCA Prox  82      10                                intimal thickening +----------+--------+--------+--------+------------------+------------------+ CCA Distal80      9                                 intimal thickening +----------+--------+--------+--------+------------------+------------------+ ICA Prox  131     18      1-39%   calcific                              +----------+--------+--------+--------+------------------+------------------+ ICA Distal81      19                                                   +----------+--------+--------+--------+------------------+------------------+ ECA       93      3                                                    +----------+--------+--------+--------+------------------+------------------+ +----------+--------+--------+--------+-------------------+  PSV cm/sEDV cm/sDescribeArm Pressure (mmHG) +----------+--------+--------+--------+-------------------+ Subclavian109                                         +----------+--------+--------+--------+-------------------+ +---------+--------+--+--------+-+ VertebralPSV cm/s29EDV cm/s4 +---------+--------+--+--------+-+   Summary: Right Carotid: Velocities in the right ICA are consistent with a 1-39% stenosis. Left Carotid: Velocities in the left ICA are consistent with a 1-39% stenosis. Vertebrals:  Bilateral vertebral arteries demonstrate antegrade flow. Subclavians: Normal flow hemodynamics were seen in bilateral subclavian              arteries. *See table(s) above for measurements and observations.     Preliminary    VAS [place] LOWER EXTREMITY VENOUS (DVT)  Result Date: 02/18/2020  Lower Venous DVT Study Indications: Pain.  Limitations: Shadowing from arteries. Comparison Study: no prior study on file Performing Technologist: Sharion Dove RVS  Examination Guidelines: A complete evaluation includes B-mode imaging, spectral Doppler, color Doppler, and power Doppler as needed of all accessible portions of each vessel. Bilateral testing is considered an integral part of a complete examination. Limited examinations for reoccurring indications may be performed as noted. The reflux portion of the exam is performed with the patient in reverse Trendelenburg.  +-----+---------------+---------+-----------+----------+--------------+  RIGHTCompressibilityPhasicitySpontaneityPropertiesThrombus Aging +-----+---------------+---------+-----------+----------+--------------+ CFV  Full           Yes      Yes                                 +-----+---------------+---------+-----------+----------+--------------+   +---------+---------------+---------+-----------+----------+--------------+ LEFT     CompressibilityPhasicitySpontaneityPropertiesThrombus Aging +---------+---------------+---------+-----------+----------+--------------+ CFV      Full           Yes      Yes                                 +---------+---------------+---------+-----------+----------+--------------+ SFJ      Full                                                        +---------+---------------+---------+-----------+----------+--------------+ FV Prox  Full                                                        +---------+---------------+---------+-----------+----------+--------------+ FV Mid   Full                                                        +---------+---------------+---------+-----------+----------+--------------+ FV DistalFull                                                        +---------+---------------+---------+-----------+----------+--------------+ PFV      Full                                                        +---------+---------------+---------+-----------+----------+--------------+  POP      Full           Yes      Yes                                 +---------+---------------+---------+-----------+----------+--------------+ PTV      Full                                                        +---------+---------------+---------+-----------+----------+--------------+ PERO     Full                                                        +---------+---------------+---------+-----------+----------+--------------+     Summary: RIGHT: - No evidence of common femoral vein  obstruction.  LEFT: - There is no evidence of deep vein thrombosis in the lower extremity.  *See table(s) above for measurements and observations.    Preliminary     Dialysis Orders:  TTS @ [facility]  --> fairly new ESRD, started HD 01/23/20 4hr, 400/600, EDW 69.5kg, 2K/2.25Ca, TDC, heparin 2000 bolus - Venofer 188m x 10 ordered (s/p 6 so far - tsat 19%) - Mircera 1024m IV q 2 weeks ordered, hasn't got 1st dose yet - Hectoral 2m53mIV q HD  Assessment/Plan: 1.  Acute Bilateral CVA: Work-up ongoing. Per neuro + primary. 2.  L foot wound: S/p LE arteriogram 8/3 - angioplasty to posterior tibial artery. VVS consulted, good flow doppler to L foot now. 3.  ESRD: Continue usual TTS schedule - HD today - no heparin. 4.  Hypertension/volume: BP slightly high - minimal LE edema. Low UF goal today. 5.  Anemia of ESRD: Hgb 8.6 - start ESA today + continue IV iron q HD. 6.  Metabolic bone disease: Ca ok, Phos slightly high - just started on Renvela binders. 7. T2DM 8. CAD/combined HF  [place] PentonA-Hershal Coria12/2021, 11:46 AM  [employer]

## 2020-02-18 NOTE — Progress Notes (Signed)
VASCULAR LAB    Left lower extremity venous duplex completed.    Preliminary report:  See CV proc for preliminary results.  Bryceton Hantz, RVT 02/18/2020, 8:43 AM

## 2020-02-18 NOTE — Progress Notes (Signed)
  Echocardiogram 2D Echocardiogram has been attempted. Patient in procedure receiving EEG. Will reattempt at later time.  Dennis Macias 02/18/2020, 10:04 AM

## 2020-02-18 NOTE — Progress Notes (Addendum)
STROKE TEAM PROGRESS NOTE   Interval History: He is awake in bed, EEG tech is at bedside placing EEG leads on the patients head. Patient in no pain with no neurological decline. No acute events overnight. He does not recall the events leading up to his hospitalization just states that he "had a stroke"   Objective Findings:   Vitals:   02/18/20 0144 02/18/20 0314 02/18/20 0332 02/18/20 0742  BP: (!) 174/82 (!) 157/59 114/81 (!) 176/95  Pulse: 90 63 67 63  Resp: 17 17 19 18   Temp: 98.6 F (37 C) 98.2 F (36.8 C) 98.2 F (36.8 C) 97.8 F (36.6 C)  TempSrc: Oral Oral Oral Oral  SpO2: 100% 100% 99% 98%  Weight: 71 kg      Stroke Labs:  02/18/20 Lipid Panel: Total cholesterol 128, HDL 62, LDL 61 02/18/20 Hemoglobin A1C: 6.4  Relevant Imaging:   I have personally reviewed the imaging below  02/17/20 MRI Brain WO Contrast Acute white matter infarcts bilaterally. Moderate atrophy and advanced chronic microvascular ischemic changes in the white matter. Multiple areas of chronic microhemorrhage in both cerebral hemispheres. Correlate with hypertension history.  02/18/20 MR Angio Head WO Contrast 1. Technically limited exam due to motion artifact. 2. Negative intracranial MRA with no large vessel occlusion. No new hemodynamically significant or correctable stenosis identified. 3. 2 mm focal outpouching extending from anterior communicating artery complex, stable. Again, this could reflect a tiny aneurysm or vascular infundibulum.  02/18/20 Echocardiogram Complete Results are pending   02/18/20 Carotid US Bilateral  Right Carotid: Velocities in the right ICA are consistent with a 1-39%  stenosis.  Left Carotid: Velocities in the left ICA are consistent with a 1-39%  stenosis.  Vertebrals: Bilateral vertebral arteries demonstrate antegrade flow.  Subclavians: Normal flow hemodynamics were seen in bilateral subclavian arteries.   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 Pupil 2 mm reactive to left eye, pupil is irregular and non reactive to right eye. Face symmetric, Intact sensation V1/V2/V3, shoulder shrug intact  Motor: 5/5 RUE + RLE, Hamstrings 4+/5 bilaterally, 5/5 Knee Flexion + Knee Extension + Plantar/Dorsiflexion bilaterally, atrophy noted to bilateral hamstrings   Coordination: No ataxia noted w/FNF + HTS  Sensation: Intact to light touch throughout  Gait: Deferred  NIHSS: 0  Assessment and Plan:  [name] is a 69 y.o. male w/pmh of stroke, DM2, CKD on HD, CHF, CAD, PVD with recent left lower extremity atherectomy and balloon angioplasty, HTN and HLD who presents after a syncopal episode at home when he was using the bathroom. He as found to have subcentimeter acute white matter infarcts bilaterally on MRI obtained upon admission.   Acute White Matter Bilateral Strokes His stroke work up is essentially complete pending the final echocardiogram read. Vessel imaging was obtained with MRA Head + Carotid US given CKD. The MRA Head did not show significant stenosis neither did the carotid US. Stroke labs have been completed including A1C 6.4, Lipid panel w/LDL 64.  Given the pattern of these strokes, as they are watershed and the fact that the patient had a syncopal episode, his strokes are likely due to hypotension post dialysis. It is recommended to obtain orthostatic hypotension vitals this admission as a part of the work up; OT was spoken to about obtaining these.   Of note, he was seen outpatient by  Dr. Antony Contras on 12/21/19. At that time the patient was taking both aspirin and plavix even though it had been recommended to be on Plavix alone due to history of hematuria. He was instructed that office visit to stay on just Plavix for stroke prevention and stop the aspirin after a discussion was held with the  patients cardiologist. It is unclear if the patient followed through with these instructions. When asked what he is taking at home, he states that he is taking Aspirin and is not sure if he is taking Plavix. He asks for his girlfriend to be contacted, who gives his medications. She was called X2, have been unable to reach her. Will continue with DAPT for now until it is clarified what he is taking at home.  - Continue DAPT for secondary stroke prevention until it is clarified what he is taking at home  - Continue Atorvastatin for secondary stroke prevention  - Needs evaluation by PT/OT/ST   Hypertension Given acute stroke, permissive HTN is indicated for now and in 48 hours blood pressure can be gradually lowered. Long term blood pressure goal is <140/90.  Hyperlipidemia At home he takes Atorvastatin 80 mg, this was resumed while hospitalized. His LDL is at goal of 61, < 70.   Prediabetes  A1C goal is < 7, he is prediabetic w/A1C of 6.4. Per review of home medications he does not appear to be on any anti hyperglycemics, he will need to follow up with his PCP and they can consider initiating Metformin. - Continue SSI while hospitalized   Hospital day # [place], NP  [facility] Nurse Practitioner Patient seen and discussed with attending physician Dr. Erlinda Hong   ATTENDING NOTE: I reviewed above note and agree with the assessment and plan. Pt was seen and examined.   69 year old male with history of ESRD on hemodialysis, cardiomyopathy, CHF, diabetes, HTN, PVD, stroke admitted for syncope episode followed by difficulty walking, imbalance, right-sided weakness and staring off episode.  MRI showed bilateral acute white matter infarcts, mostly left CR and bilateral MCA/ACA watershed area.  MRA unremarkable.  LE venous Doppler negative for DVT.  Carotid Doppler unremarkable.  EF 60 to 65%.  EEG no seizure.  A1c 6.4, LDL 61.  Still has hyperglycemia, creatinine 3.55 and hemoglobin  8.6.  Orthostatic vital check showed orthostatic hypotension with lying 148/71, sitting 109/71, standing 106/60, standing 3 minutes 95/60.  Patient admitted 09/2015 for bilateral frontal infarct, considered to be related to uncontrolled hypertension, diabetes and dehydration at the time.  LDL 148, A1c 10.7.  TEE no PFO.  EF 60 to 65%.  Carotid Doppler negative and MRA unremarkable.  He was again admitted 11/2019 for bacteremia, non-STEMI, CKD.  Treated with cefepime and antiplatelet therapy.  MRI had incidental finding of left MCA/ACA punctate infarct.  MRA and carotid Doppler unremarkable.  EF 40 to 45%.  EEG negative for seizure.  LDL 56, A1c 5.9.  He was discharged on Plavix and Lipitor 80.  He was on DAPT due to hematuria.  Patient current event occurred after hemodialysis and after positioning changes from lying to standing or sitting.  Given his profound orthostatic hypotension, patient likely had syncope event with hypoperfusion and subsequent stroke involving right matter and watershed territories.  Put him on TED hose, and recommend avoid low BP.  Orthostatic hypotension further management as per primary team.  Regarding antiplatelet therapy, aspirin and Plavix were listed in his medication list.  However patient state that his  girlfriend is giving him medication.  Try to contact girlfriend but not available.  Will attempt again tomorrow.  Continue DAPT and Lipitor for now.  Will follow.  Rosalin Hawking, MD PhD Stroke Neurology 02/18/2020 6:29 PM     To contact Stroke Continuity provider, please refer to [email or web address] After hours, contact General Neurology

## 2020-02-18 NOTE — Progress Notes (Signed)
Subjective:  Interviewed patient at bedside.  He reports not remembering much of what happened during his "episodes." He remembers waking up in the ambulance on the way to the hospital. We discussed that multiple infarcts where seen and that further testing was needed.  Objective:  Vital signs in last 24 hours: Vitals:   02/18/20 0047 02/18/20 0144 02/18/20 0314 02/18/20 0332  BP: (!) 153/68 (!) 174/82 (!) 157/59 114/81  Pulse: 69 90 63 67  Resp: 18 17 17 19   Temp: 98.2 F (36.8 C) 98.6 F (37 C) 98.2 F (36.8 C) 98.2 F (36.8 C)  TempSrc: Oral Oral Oral Oral  SpO2: 100% 100% 100% 99%  Weight:  71 kg     Physical Exam Vitals and nursing note reviewed.  Constitutional:      General: He is not in acute distress.    Appearance: Normal appearance. He is normal weight. He is not ill-appearing or toxic-appearing.  HENT:     Head: Normocephalic and atraumatic.  Cardiovascular:     Rate and Rhythm: Normal rate and regular rhythm.     Pulses:          Radial pulses are 2+ on the right side and 2+ on the left side.       Posterior tibial pulses are 1+ on the right side and 1+ on the left side.     Heart sounds: Normal heart sounds, S1 normal and S2 normal. No murmur heard.   Pulmonary:     Effort: Pulmonary effort is normal. No respiratory distress.     Breath sounds: Normal breath sounds. No wheezing.  Musculoskeletal:     Right lower leg: No edema.     Left lower leg: No edema.  Neurological:     General: No focal deficit present.     Mental Status: He is alert. Mental status is at baseline.  Psychiatric:        Mood and Affect: Mood normal.        Behavior: Behavior normal.        Thought Content: Thought content normal.      Assessment/Plan:  Active Problems:   ESRD (end stage renal disease) ([facility])   CVA (cerebral vascular accident) ([place])   PAD (peripheral artery disease) ([place])   Mr. Bibby is a 69 y.o. person with PMHx L ICA CVA, NIDDM type II with  retinopathy, CKD on hemodialysis, nephrotic syndrome, combined systolic and diastolic CHF, CAD w/ NSTEMI, HTN, HLD, and protein malnutrition who was brought to the ED by EMS after experiencing three distinct episodes of acute weakness with altered mental status.   Acute CVA: Patient presented with three 1-minute episodes of right-sided weakness and altered consciousness that occurred intermittently around 9:00pm 02/16/20. MR brain without contrast showed small areas of acute white matter infarct in the right frontal lobe and left parietal lobe with scattered areas of chronic microhemorrhage in both cerebral hemispheres and right external capsule and chronic microvascular ischemic white matter changes. Neurology consulted and believe findings are consistent with amyloid angiopathy and chronic hypertensive arteriopathy. EEG showed mild diffuse encephalopathy (nonspecific etiology) and no seizures or epileptiform changes. Carotid doppler shows Right and Left ICA velocities consistent with 1-39% stenosis. -Neurology consulted, appreciate their recommendations: -HgbA1c is 6.4 -Lipid panel within normal limits -Await ECHO results -PT/OT consult, speech consult -Continue ASA 81mg  and clopidogrel 75mg  daily  -Discuss risk factor modification  -Telemetry monitoring -Frequent neuro checks    ESRD on HD TTS: Patient has recently been  started on dialysis. Last dialysis session was Tuesday 02/16/20. He continues to make urine although he hasn't had much to eat or drink today. He appears euvolemic on exam with minimal LE edema and clear lungs. He takes Lasix 80mg  BID at home.  -Continue HD TTS -Continue home sevelamer carbonate 800mg  TID -Have consulted nephrology and they will see him -Held Lasix for permissive hypertension    Peripheral Artery Disease: Underwent arteriogram with atherectomy and angioplasty of the left posterior tibial artery 02/09/20. On exam, he has diminished pulses of the bilateral LE's.  DP pulse unable to be palpated on physical exam of the left lower extremity. Doppler ultrasound of the LLE with faint DP pulse on the left. Vascular has evaluated the patient and and states no vascular intervention is necessary at this time and for him to keep his previously scheduled outpatient appointment. -Keep outpatient follow-up with vascular for RLE arteriogram 02/26/20   Biventricular Fascicular Block: Patient has biventricular fascicular block on EKG that has progressed from previous EKG's from June and July of this year. He also has first degree AV block on EKG.  -Await ECHO results   HFrEF: Last ECHO 12/24/19 showed EF 35-40% with global hypokinesis and mild dilation of the LV and mild LVH, Grade II DD. Moderately elevated pulmonary systolic pressures with moderate MR. Patient clinically appears euvolemic on examination today.   -Await ECHO results -Strict I&O  -Daily weights    Hypertension: Blood pressures ranging from 373'G-681'P systolic. -Neurology consulted, appreciate their recommendations -For now, allow permissive hypertension <220/141mmHg    Hyperlipidemia:  -Continue Atorvastatin 80mg  daily    Chronic Normocytic Anemia: Hemoglobin 8.6, MCV 91.3. Stable and likely secondary to anemia of chronic disease / ESRD. -Continue to monitor CBC    BPH: Patient is still able to make urine despite HD.  -Continue tamsulosin 0.4mg  daily  -Continue finasteride 5mg  daily    Diet: Renal with fluid restriction to 1.2L/day  VTE: Heparin 5000U SQ TID  IVF: none Code: Full Code    Prior to Admission Living Arrangement: Home Anticipated Discharge Location: SNF Barriers to Discharge: Medical Workup Dispo: Anticipated discharge in approximately 2-4 day(s).   Briant Cedar, MD 02/18/2020, 6:21 AM Pager: [phone] After 5pm on weekdays and 1pm on weekends: On Call pager [phone]

## 2020-02-19 LAB — GLUCOSE, CAPILLARY
Glucose-Capillary: 121 mg/dL — ABNORMAL HIGH (ref 70–99)
Glucose-Capillary: 227 mg/dL — ABNORMAL HIGH (ref 70–99)
Glucose-Capillary: 268 mg/dL — ABNORMAL HIGH (ref 70–99)
Glucose-Capillary: 153 mg/dL — ABNORMAL HIGH (ref 70–99)
Glucose-Capillary: 194 mg/dL — ABNORMAL HIGH (ref 70–99)

## 2020-02-19 NOTE — Progress Notes (Addendum)
STROKE TEAM PROGRESS NOTE   Interval History: No acute events overnight. He is stable with no neurologic decline noted.   Objective Findings:   Vitals:   02/19/20 0144 02/19/20 0300 02/19/20 0341 02/19/20 0814  BP: (!) 164/76  (!) 152/55 126/64  Pulse: 72  72 64  Resp: 18  18 17   Temp: 99.5 F (37.5 C)  98 F (36.7 C) 99.3 F (37.4 C)  TempSrc: Oral  Oral Oral  SpO2: 98%  100% 100%  Weight:  69.5 kg     Stroke Labs:  02/18/20 Lipid Panel: Total cholesterol 128, HDL 62, LDL 61 02/18/20 Hemoglobin A1C: 6.4  Relevant Imaging:   I have personally reviewed the imaging below  02/17/20 MRI Brain WO Contrast Acute white matter infarcts bilaterally. Moderate atrophy and advanced chronic microvascular ischemic changes in the white matter. Multiple areas of chronic microhemorrhage in both cerebral hemispheres. Correlate with hypertension history.  02/18/20 MR Angio Head WO Contrast 1. Technically limited exam due to motion artifact. 2. Negative intracranial MRA with no large vessel occlusion. No new hemodynamically significant or correctable stenosis identified. 3. 2 mm focal outpouching extending from anterior communicating artery complex, stable. Again, this could reflect a tiny aneurysm or vascular infundibulum.  02/18/20 Echocardiogram Complete 1. Left ventricular ejection fraction, by estimation, is 60 to 65%. The left ventricle has normal function. The left ventricle has no regional wall motion abnormalities. Left ventricular diastolic parameters are  consistent with Grade II diastolic dysfunction (pseudonormalization).  2. Right ventricular systolic function is normal. The right ventricular size is normal.  3. Left atrial size was moderately dilated.  4. Right atrial size was mildly dilated.  5. The mitral valve is normal in structure. No evidence of mitral valve regurgitation.  6. The aortic valve is normal in structure. Aortic valve regurgitation is not visualized.   02/18/20  Carotid US Bilateral  Right Carotid: Velocities in the right ICA are consistent with a 1-39% stenosis.  Left Carotid: Velocities in the left ICA are consistent with a 1-39% stenosis.  Vertebrals: Bilateral vertebral arteries demonstrate antegrade flow.  Subclavians: Normal flow hemodynamics were seen in bilateral subclavian arteries.   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 Pupil 2 mm reactive to left eye, pupil is irregular and non reactive to right eye. Face symmetric, Intact sensation V1/V2/V3, shoulder shrug intact  Motor: 5/5 RUE + RLE, Hamstrings 4+/5 bilaterally, 5/5 Knee Flexion + Knee Extension + Plantar/Dorsiflexion bilaterally, atrophy noted to bilateral hamstrings   Coordination: No ataxia noted w/FNF + HTS  Sensation: Intact to light touch throughout  Gait: Deferred  NIHSS: 0  Assessment and Plan:  Dennis Macias is a 69 y.o. male w/pmh of stroke, DM2, CKD on HD, CHF, CAD, PVD with recent left lower extremity atherectomy and balloon angioplasty, HTN and HLD who presents after a syncopal episode at home when he was using the bathroom. He as found to have subcentimeter acute white matter infarcts bilaterally on MRI obtained upon admission.   NEURO #Acute White Matter Bilateral Strokes His stroke work up is complete at this time. Vessel imaging was obtained with MRA Head + Carotid US given CKD. The MRA Head did not show significant stenosis neither did the carotid US. His Echocardiogram revealed EF 60 - 65 %, Left Ventricle w/Grade II diastolic dysfunction, left atrium moderately dilated and no intra cardiac  source of stroke. Stroke labs have been completed including A1C 6.4, Lipid panel w/LDL 64. Given the pattern of these strokes in a watershed distribution and the fact that the patient had a syncopal episode, his strokes are likely  due to hypotension post dialysis or orthostatic hypotension. Per the patients girlfriend when EMS arrived at the scene his SBP was trending in the 80s and per review of vitals taken upon arrival at the hospital his SBP was noted to be 103 and then 110. Noted to have orthostatic hypotension this admission as documented by PT on 02/18/20.  Of note, he was seen outpatient by Dr. Antony Contras on 12/21/19. At that time the patient was taking both aspirin and plavix even though it had been recommended to be on Plavix alone due to history of hematuria. He was instructed that office visit to stay on just Plavix for stroke prevention and stop the aspirin pending a discussion with the patients cardiologist. The patients girlfriend, Dennis Macias, who gives him his medications was contacted on 02/19/20 to discuss what he has been taking at home since the office visit on 12/21/19. Apparently at home he has been taking both Aspirin and Plavix with no noted hematuria or bruising. Given this,will continue DAPT and possible monotherapy can be decided on an outpatient basis if necessary. Of note, a cardiac event monitor was ordered for the patient on 12/21/19 by Dr. Antony Contras. He underwent monitoring from 12/30/2019 - 01/28/2020. The cardiac event monitor showed Sinus Rhythm w/PACs with a heart rate of 60.7 bpm. Manually detected events included sinus bradycardia w/1st degree AV block, and sinus tachycardia w/artifact.  - Continue DAPT with Aspirin 81 mg and Plavix 75 mg  - Continue Atorvastatin for secondary stroke prevention   CARDS #Hypertension #Orthostatic Hypotension Orthostatic vitals were ordered given his presenting symptoms of syncope and are documented in the PT note on 02/18/20. His blood pressure lying was 148/71, sitting 109/71 and standing 95/60. Given orthostatic hypotension he needs to be careful with position changes. At this time, his blood pressure is trending 120-170. He no longer needs permissive HTN and blood  pressure can be slowly normalized being sure to avoid acute drops in pressure. Consider Midodrine if necessary for orthostatic hypotension.  - Slowly lower BP at this time, making sure to avoid acute drops in blood pressure  - Consider Midodrine if necessary for orthostatic hypotension   Hyperlipidemia At home he takes Atorvastatin 80 mg, this was resumed while hospitalized. His LDL is at goal of 61, < 70.   ENDO Prediabetes  A1C goal is < 7, he is prediabetic w/A1C of 6.4. Per review of home medications he does not appear to be on any anti hyperglycemics, he will need to follow up with his PCP and they can consider initiating Metformin. - Continue SSI while hospitalized   DISPO He will need to follow up on an outpatient basis with stroke and PCP.   Neurology will sign off at this time. If there are any questions please do not hesitate to reach out via Amion.   Hospital day # 2   Ruta Hinds, NP  [facility] Nurse Practitioner Patient seen and discussed with attending physician Dr. Erlinda Hong   ATTENDING NOTE: I reviewed above note and agree with the assessment and plan. Pt was seen and examined.   Patient lying in bed, no complaints.  Talked with patient girlfriend, confirmed that patient takes DAPT PTA.  We will continue DAPT on discharge.  Patient had prominent orthostatic  hypotension, ordered TED hose yesterday.  Discussed with primary team regarding further orthostatic hypotension measurement.  Avoid low BP at home and during dialysis.  Neurology will sign off. Please call with questions. Pt will follow up with stroke clinic NP at [facility] in about 4 weeks. Thanks for the consult.   Rosalin Hawking, MD PhD Stroke Neurology 02/19/2020 11:23 AM     To contact Stroke Continuity provider, please refer to [email or web address] After hours, contact General Neurology

## 2020-02-19 NOTE — Progress Notes (Signed)
[facility] Progress Note  Subjective: see in room, no c/o, no paralysis , swallowing or speech difficulty  Vitals:   02/19/20 0144 02/19/20 0300 02/19/20 0341 02/19/20 0814  BP: (!) 164/76  (!) 152/55 126/64  Pulse: 72  72 64  Resp: 18  18 17   Temp: 99.5 F (37.5 C)  98 F (36.7 C) 99.3 F (37.4 C)  TempSrc: Oral  Oral Oral  SpO2: 98%  100% 100%  Weight:  69.5 kg      Exam: General: Well developed, well nourished, in no acute distress. Head: Normocephalic, atraumatic, R eye with cloudy lens. Neck: Supple without lymphadenopathy/masses. JVD not elevated. Lungs: Clear bilaterally to auscultation without wheezes, rales, or rhonchi.  Heart: RRR with normal S1, S2. No murmurs, rubs, or gallops appreciated. Abdomen: Soft, non-tender, non-distended with normoactive bowel sounds. No rebound/guarding. No obvious abdominal masses. Musculoskeletal:  Strength and tone appear normal for age. Lower extremities: 1+ LE edema; L toe wound not examined Neuro: Alert and oriented X 3. Moves all extremities spontaneously. Psych:  Responds to questions appropriately with a normal affect. Dialysis Access: [facility] R chest + R AVF + thrill    OP HD: TTS [facility] (started 01/23/20)  4h  400/600  69.5kg  2/2.25 bath  TDC   Hep 2000  - venofer 100 x 10, has 4 left  - mircera 100 q 2, hasn't started yet  - Hect 1 ug   Assessment/ Plan: 1. Acute Bilateral CVA: w/ sx's of R sided weakness and unresponsiveness off/ on x 3 episodes on 810. Admitted 8/11, MRI showed bilat white matter infarcts. MRA w/o sig occlusions. Work-up ongoing. Per neuro + primary. 2.  Chronic L toe wound: S/p LE arteriogram 8/3 - angioplasty to posterior tibial artery. VVS consulted, good flow doppler to L foot now. 3.  ESRD: Continue usual TTS schedule. Had HD yest, next HD Sat 4.  Hypertension/volume: BP slightly high, no edema, at dry wt.  5.  Anemia of ESRD: Hgb 8.6 - started ESA today + continue IV iron load  6.   Metabolic bone disease: Ca ok, Phos slightly high - just started on Renvela binders. 7. T2DM 8. CAD/combined HF     Rob Dustine Bertini 02/19/2020, 10:12 AM   Recent Labs  Lab 02/17/20 0039 02/18/20 0152  K 3.6 3.6  BUN 16 31*  CREATININE 2.21* 3.55*  CALCIUM 8.6* 8.7*  PHOS  --  5.7*  HGB 8.5* 8.6*   Inpatient medications: . aspirin EC  81 mg Oral Daily  . atorvastatin  80 mg Oral QHS  . Chlorhexidine Gluconate Cloth  6 each Topical Q0600  . clopidogrel  75 mg Oral Daily  . dorzolamide-timolol  1 drop Right Eye BID  . finasteride  5 mg Oral Daily  . heparin  5,000 Units Subcutaneous Q8H  . heparin sodium (porcine)      . insulin aspart  0-5 Units Subcutaneous QHS  . insulin aspart  0-9 Units Subcutaneous TID WC  . sevelamer carbonate  800 mg Oral 3 times per day on Sun Mon Wed Fri  . sevelamer carbonate  800 mg Oral 2 times per day on Tue Thu Sat  . tamsulosin  0.4 mg Oral QPC supper  . vitamin B-12  1,000 mcg Oral Daily    acetaminophen, senna-docusate

## 2020-02-19 NOTE — Progress Notes (Signed)
Subjective:  Interviewed patient at bedside.  States he's feeling fine.  Discussed dialysis yesterday, said it went fine but took a long time. Says dialysis isn't too bad, no blurry vision or lightheadedness  He reports he is not sure if taking BP medications, says girlfriend manages his medications  Discussed orthostatic BP findings, dispo in 0-1d   Objective:  Vital signs in last 24 hours: Vitals:   02/18/20 2330 02/19/20 0144 02/19/20 0300 02/19/20 0341  BP: (!) 155/74 (!) 164/76  (!) 152/55  Pulse:  72  72  Resp:  18  18  Temp:  99.5 F (37.5 C)  98 F (36.7 C)  TempSrc:  Oral  Oral  SpO2:  98%  100%  Weight:   69.5 kg    Physical Exam Vitals and nursing note reviewed.  Constitutional:      General: He is not in acute distress.    Appearance: He is not ill-appearing or toxic-appearing.  HENT:     Head: Normocephalic and atraumatic.  Cardiovascular:     Rate and Rhythm: Normal rate and regular rhythm.     Pulses: Normal pulses.          Radial pulses are 2+ on the right side and 2+ on the left side.       Dorsalis pedis pulses are 2+ on the right side and 2+ on the left side.     Heart sounds: Normal heart sounds, S1 normal and S2 normal. No murmur heard.   Pulmonary:     Effort: Pulmonary effort is normal. No respiratory distress.     Breath sounds: Normal breath sounds. No wheezing.  Musculoskeletal:     Right lower leg: No edema.     Left lower leg: No edema.  Neurological:     Mental Status: He is alert. Mental status is at baseline.      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 Kafer is a 69 y.o.person with PMHx L ICA CVA, NIDDM type II with retinopathy, CKD on hemodialysis, nephrotic syndrome, combined systolic and  diastolic CHF, CAD w/ NSTEMI, HTN, HLD, and protein malnutritionwho was brought to the ED by EMS after experiencing three distinct episodes of acute weakness with altered mental status.   Acute CVA: Echo revealed no wall motion abnormalities and no structural abnormalities. PT/OT have measured orthostatic hypotension. Due to infarcts being at watershed areas need to carefully manage BP. Has been normotensive without medications. Will discuss with Nephrology about possible changes to dialysis to ensure BP drops do not happen. -Neurology consulted, appreciate their recommendations: -Continue ASA 81mg  and clopidogrel 75mg  daily  -Discuss risk factor modification  -Telemetry monitoring -Frequent neuro checks    ESRD on HD TTS: Patient has recently been started on dialysis. Last dialysis session was Tuesday 02/16/20. He continues to make urine although he hasn't had much to eat or drink today. He appears euvolemic on exam with minimal LE edema and clear lungs.He takes Lasix 80mg  BID at home.Have messaged Nephrology about orthostatic findings for possible changes to Dialysis Sessions -Continue HD TTS -Continue home sevelamer carbonate 800mg  TID -Welcome further Nephrology recommendations   Peripheral Artery Disease: Underwent arteriogram with atherectomy and angioplasty of the left posterior tibial artery 02/09/20. On exam, he has diminished pulses of the  bilateral LE's. DP pulse unable to be palpated on physical exam of the left lower extremity. Doppler ultrasound of the LLE with faint DP pulse on the left. Vascular has evaluated the patient and and states no vascular intervention is necessary at this time and for him to keep his previously scheduled outpatient appointment. -Keep outpatient follow-up with vascular for RLE arteriogram 02/26/20   Biventricular Fascicular Block: Patient has biventricular fascicular block on EKG that has progressed from previous EKG's from June and July of this  year. He also has first degree AV block on EKG. Echo revealed no wall motion abnormalities and no structural abnormalities.   HFrEF: Last ECHO 12/24/19 showed EF 35-40% with global hypokinesis and mild dilation of the LV and mild LVH, Grade II DD. Moderately elevated pulmonary systolic pressures with moderate MR. Patient clinically appears euvolemic on examination today. Echo revealed no wall motion abnormalities and no structural abnormalities with 60-65% EF. -Strict I&O  -Daily weights    Hypertension: Blood pressures ranging from 098'J-191'Y systolic. -Neurology consulted, appreciate their recommendations -Will not prescribe any blood pressure medications due to orthostatic hypotension   Hyperlipidemia:  -Continue Atorvastatin 80mg  daily    Chronic Normocytic Anemia: Hemoglobin 8.6, MCV 91.3. Stable and likely secondary to anemia of chronic disease / ESRD. -Continue to monitor CBC    BPH: Patient is still able to make urine despite HD.  -Continue tamsulosin 0.4mg  daily  -Continue finasteride 5mg  daily   Diet:Renal with fluid restriction to 1.2L/day NWG:NFAOZHY 5000U SQ TID QMV:HQIO Code:Full Code   Prior to Admission Living Arrangement: Home Anticipated Discharge Location: Home Barriers to Discharge: Blood Pressure control Dispo: Anticipated discharge in approximately 0-1 day(s).   Briant Cedar, MD 02/19/2020, 6:41 AM Pager: [phone] After 5pm on weekdays and 1pm on weekends: On Call pager [phone]

## 2020-02-19 NOTE — Progress Notes (Signed)
Occupational Therapy Treatment Patient Details Name: Dennis Macias MRN: [id number] DOB: 26-Nov-1950 Today's Date: 02/19/2020    History of present illness Pt is a 69 y/o male with PMH of L ICA CVA, NIDDM type II with retinopathy, CKD on hemodialysis, nephrotic syndrome, combined systolic and diastolic CHF, CAD w/ NSTEMI, HTN, HLD, and protein malnutrition who was brought to the ED by EMS after experiencing three distinct short episodes of R sided acute weakness with altered mental status at home following dialysis. MRI reveals acute white matter infarcts bilaterally as well as chronic microhemorrhages.   OT comments  Pt received in bed, agreeable to OT session. Mobility limited this session secondary to positive orthostatic BP. Pt required minguard for sit<>stand and standing balance. He demonstrates decreased activity tolerance requesting to return to supine after 3 min standing, but agreeable to continue with OT session. Pt completed Trail Making Test Part A in 3 minutes and with 5 errors. This assessment addresses motor speed and visual attention. Pt will continue to benefit from skilled OT services to maximize safety and independence with ADL/IADL and functional mobility. Will continue to follow acutely and progress as tolerated.   Orthostatic Blood Pressure: RN notified  Supine: 168/74 Hr 67  Sitting: 129/71 Hr 69  Standing 1 min: 117/60 Hr 71  Standing 3 min: 99/64 HR 72  Sitting after 5 min: 117/69 HR 69   Follow Up Recommendations  Home health OT;Supervision/Assistance - 24 hour (initally)    Equipment Recommendations  None recommended by OT    Recommendations for Other Services      Precautions / Restrictions Precautions Precautions: Fall Precaution Comments: blind R eye Restrictions Weight Bearing Restrictions: No       Mobility Bed Mobility Overal bed mobility: Needs Assistance Bed Mobility: Supine to Sit;Sit to Supine     Supine to sit: Supervision Sit to  supine: Supervision   General bed mobility comments: increased time and effort, supervision for safety  Transfers Overall transfer level: Needs assistance Equipment used: Rolling walker (2 wheeled) Transfers: Sit to/from [facility] Sit to Stand: Min guard         General transfer comment: minguard for safety and stability, pt demonstrating safe hand placement     Balance Overall balance assessment: Needs assistance Sitting-balance support: No upper extremity supported Sitting balance-Leahy Scale: Good     Standing balance support: Bilateral upper extremity supported Standing balance-Leahy Scale: Poor Standing balance comment: relies on UE support;decreased standing tolerance, requsting to sit after 3 min                           ADL either performed or assessed with clinical judgement   ADL Overall ADL's : Needs assistance/impaired     Grooming: Set up;Sitting               Lower Body Dressing: Min guard;Sit to/from stand Lower Body Dressing Details (indicate cue type and reason): minguard for safety, able to don/doff socks sitting EOB   Toilet Transfer Details (indicate cue type and reason): side stepping towards HOB, limited due to orthostatic BP Toileting- Clothing Manipulation and Hygiene: Min guard;Sit to/from stand         General ADL Comments: session limited due to orthostatic BP, pt completed trail making test part A      Vision   Additional Comments: R eye blind, trail making test Part A to assess visual attention and motor speed, pt completed test in 3 min  with 5 mistakes, requiring encouragement to continue test, pt getting frustrated stating "I can't do this"   Perception     Praxis      Cognition Arousal/Alertness: Awake/alert Behavior During Therapy: WFL for tasks assessed/performed Overall Cognitive Status: Within Functional Limits for tasks assessed                                 General  Comments: following simple commands and engaging appropriately throughout session;short term memory WNL for trail making task and ADL        Exercises     Shoulder Instructions       General Comments limited session due to orthostatic BP    Pertinent Vitals/ Pain       Pain Assessment: No/denies pain  Home Living                                          Prior Functioning/Environment              Frequency  Min 2X/week        Progress Toward Goals  OT Goals(current goals can now be found in the care plan section)  Progress towards OT goals: Progressing toward goals  Acute Rehab OT Goals Patient Stated Goal: to get home  OT Goal Formulation: With patient Time For Goal Achievement: 03/03/20 Potential to Achieve Goals: Good ADL Goals Pt Will Perform Grooming: with modified independence;standing Pt Will Perform Lower Body Dressing: sit to/from stand;with modified independence Pt Will Transfer to Toilet: with modified independence;ambulating Pt Will Perform Toileting - Clothing Manipulation and hygiene: with modified independence;sit to/from stand Pt Will Perform Tub/Shower Transfer: Tub transfer;with supervision;rolling walker;tub bench;ambulating Additional ADL Goal #1: Patient will complete 3 step trail making task, including simple IADL, with no more than supervision.  Plan Discharge plan remains appropriate    Co-evaluation                 AM-PAC OT "6 Clicks" Daily Activity     Outcome Measure   Help from another person eating meals?: A Little Help from another person taking care of personal grooming?: A Little Help from another person toileting, which includes using toliet, bedpan, or urinal?: A Little Help from another person bathing (including washing, rinsing, drying)?: A Little Help from another person to put on and taking off regular upper body clothing?: A Little Help from another person to put on and taking off regular  lower body clothing?: A Little 6 Click Score: 18    End of Session Equipment Utilized During Treatment: Gait belt;Rolling walker  OT Visit Diagnosis: Other abnormalities of gait and mobility (R26.89);Muscle weakness (generalized) (M62.81)   Activity Tolerance Patient tolerated treatment well   Patient Left in bed;with call bell/phone within reach;with bed alarm set   Nurse Communication Mobility status;Other (comment) (orthostatics)        Time: 1884-1660 OT Time Calculation (min): 23 min  Charges: OT General Charges $OT Visit: 1 Visit OT Treatments $Self Care/Home Management : 8-22 mins $Therapeutic Activity: 8-22 mins  Helene Kelp OTR/L Acute Rehabilitation Services Office: [address] 02/19/2020, 11:47 AM

## 2020-02-19 NOTE — Progress Notes (Signed)
Physical Therapy Treatment Patient Details Name: Dennis Macias MRN: [id number] DOB: May 04, 1951 Today's Date: 02/19/2020    History of Present Illness Pt is a 69 y/o male with PMH of L ICA CVA, NIDDM type II with retinopathy, CKD on hemodialysis, nephrotic syndrome, combined systolic and diastolic CHF, CAD w/ NSTEMI, HTN, HLD, and protein malnutrition who was brought to the ED by EMS after experiencing three distinct short episodes of R sided acute weakness with altered mental status at home following dialysis. MRI reveals acute white matter infarcts bilaterally as well as chronic microhemorrhages.    PT Comments    Patient progressing with mobility up OOB to chair for lunch.  He wanted to stay in bed, but got up with encouragement.  Noted remains orthostatic per OT notes and discussed with RN need for TED stockings.  He will continue to benefit from skilled PT in the acute setting and from follow up [place] at d/c.    Follow Up Recommendations  Home health PT     Equipment Recommendations  None recommended by PT    Recommendations for Other Services       Precautions / Restrictions Precautions Precautions: Fall Precaution Comments: blind R eye' watck BP    Mobility  Bed Mobility Overal bed mobility: Needs Assistance Bed Mobility: Supine to Sit     Supine to sit: Supervision     General bed mobility comments: with effort, S for safety  Transfers Overall transfer level: Needs assistance Equipment used: Rolling walker (2 wheeled) Transfers: Sit to/from [facility] Sit to Stand: Min guard Stand pivot transfers: Min guard       General transfer comment: assist for balance and safety, environment set up  Ambulation/Gait             General Gait Details: deferred due to positive orthostatics with OT prior to PT   Stairs             Wheelchair Mobility    Modified Rankin (Stroke Patients Only)       Balance Overall balance assessment: Needs  assistance Sitting-balance support: Feet supported Sitting balance-Leahy Scale: Good     Standing balance support: Bilateral upper extremity supported Standing balance-Leahy Scale: Poor Standing balance comment: UE support for balance                            Cognition Arousal/Alertness: Awake/alert Behavior During Therapy: WFL for tasks assessed/performed Overall Cognitive Status: Within Functional Limits for tasks assessed                                        Exercises General Exercises - Lower Extremity Ankle Circles/Pumps: Both;10 reps;AROM;Supine Hip ABduction/ADduction: Strengthening;10 reps;Both (x 2 sets w/ light green t-band) Straight Leg Raises: Strengthening;Both;10 reps;Supine Other Exercises Other Exercises: bridging x 10 in suipne    General Comments General comments (skin integrity, edema, etc.): limited to bed to chair only due to orthostatic, did discuss with RN need for TED stockings, patient did not want to get up to recliner, but did with encouragement for lunch set up      Pertinent Vitals/Pain Pain Assessment: No/denies pain    Home Living                      Prior Function  PT Goals (current goals can now be found in the care plan section) Progress towards PT goals: Progressing toward goals    Frequency    Min 4X/week      PT Plan Current plan remains appropriate    Co-evaluation              AM-PAC PT "6 Clicks" Mobility   Outcome Measure  Help needed turning from your back to your side while in a flat bed without using bedrails?: None Help needed moving from lying on your back to sitting on the side of a flat bed without using bedrails?: None Help needed moving to and from a bed to a chair (including a wheelchair)?: A Little Help needed standing up from a chair using your arms (e.g., wheelchair or bedside chair)?: A Little Help needed to walk in hospital room?: A Little Help  needed climbing 3-5 steps with a railing? : A Little 6 Click Score: 20    End of Session   Activity Tolerance: Patient limited by fatigue;Treatment limited secondary to medical complications (Comment) (orthostatic hypotension) Patient left: in bed;with call bell/phone within reach;with chair alarm set   PT Visit Diagnosis: Other abnormalities of gait and mobility (R26.89);Muscle weakness (generalized) (M62.81)     Time: 4388-8757 PT Time Calculation (min) (ACUTE ONLY): 24 min  Charges:  $Therapeutic Exercise: 8-22 mins $Therapeutic Activity: 8-22 mins                     Magda Kiel, PT Acute Rehabilitation Services VJKQA:[phone] Office:[phone] 02/19/2020    Reginia Naas 02/19/2020, 4:07 PM

## 2020-02-20 DIAGNOSIS — E11319 Type 2 diabetes mellitus with unspecified diabetic retinopathy without macular edema: Secondary | ICD-10-CM

## 2020-02-20 LAB — FERRITIN: Ferritin: 355 ng/mL — ABNORMAL HIGH (ref 24–336)

## 2020-02-20 LAB — GLUCOSE, CAPILLARY
Glucose-Capillary: 216 mg/dL — ABNORMAL HIGH (ref 70–99)
Glucose-Capillary: 203 mg/dL — ABNORMAL HIGH (ref 70–99)
Glucose-Capillary: 186 mg/dL — ABNORMAL HIGH (ref 70–99)

## 2020-02-20 LAB — CBC
HCT: 25.7 % — ABNORMAL LOW (ref 39.0–52.0)
Hemoglobin: 8.1 g/dL — ABNORMAL LOW (ref 13.0–17.0)
MCH: 28.5 pg (ref 26.0–34.0)
MCHC: 31.5 g/dL (ref 30.0–36.0)
MCV: 90.5 fL (ref 80.0–100.0)
Platelets: 322 10*3/uL (ref 150–400)
RBC: 2.84 MIL/uL — ABNORMAL LOW (ref 4.22–5.81)
RDW: 13.6 % (ref 11.5–15.5)
WBC: 7.8 10*3/uL (ref 4.0–10.5)
nRBC: 0 % (ref 0.0–0.2)

## 2020-02-20 LAB — RENAL FUNCTION PANEL
Albumin: 2.3 g/dL — ABNORMAL LOW (ref 3.5–5.0)
Anion gap: 10 (ref 5–15)
BUN: 25 mg/dL — ABNORMAL HIGH (ref 8–23)
CO2: 26 mmol/L (ref 22–32)
Calcium: 8.3 mg/dL — ABNORMAL LOW (ref 8.9–10.3)
Chloride: 99 mmol/L (ref 98–111)
Creatinine, Ser: 2.9 mg/dL — ABNORMAL HIGH (ref 0.61–1.24)
GFR calc Af Amer: 25 mL/min — ABNORMAL LOW (ref >60)
GFR calc non Af Amer: 21 mL/min — ABNORMAL LOW (ref >60)
Glucose, Bld: 229 mg/dL — ABNORMAL HIGH (ref 70–99)
Phosphorus: 3.5 mg/dL (ref 2.5–4.6)
Potassium: 4 mmol/L (ref 3.5–5.1)
Sodium: 135 mmol/L (ref 135–145)

## 2020-02-20 LAB — IRON AND TIBC
Iron: 21 ug/dL — ABNORMAL LOW (ref 45–182)
Saturation Ratios: 13 % — ABNORMAL LOW (ref 17.9–39.5)
TIBC: 164 ug/dL — ABNORMAL LOW (ref 250–450)
UIBC: 143 ug/dL

## 2020-02-20 MED ORDER — HEPARIN SODIUM (PORCINE) 1000 UNIT/ML IJ SOLN
INTRAMUSCULAR | Status: AC
  Filled 2020-02-20: qty 2

## 2020-02-20 NOTE — Progress Notes (Signed)
    Subjective:  Dennis Macias is a 69 y.o. with PMH of systolic heart failure, ESRD, DM, PAD HTN admit for CVA on hospital day 3  Dennis Macias was examined and evaluated at bedside this am. He mentions feeling well without significant light-headedness or dizziness. He does not think he had any additional staring spells. Denies any nausea, vomiting diarrhea. Discussed assessing his volume status after dialysis to ensure his blood pressure remains stable. Denies any R sided weakness.  Objective:  Vital signs in last 24 hours: Vitals:   02/20/20 0355 02/20/20 0356 02/20/20 0813 02/20/20 1119  BP: (!) 137/58  (!) 148/70 (!) 177/78  Pulse: 72  68 68  Resp: 18  18 18   Temp: 98.6 F (37 C)  98.2 F (36.8 C) 98.7 F (37.1 C)  TempSrc: Oral     SpO2: 100%  100% 99%  Weight:  71 kg     Gen: Well-developed, well nourished, NAD HEENT: NCAT head, hearing intact, EOMI, MMM Pulm: Breathing comfortably on room air, no cough, no distress  Extm: ROM intact, TED stockings in place Skin: Dry, Warm, normal turgor Neuro: AAOx3  Assessment/Plan:  Principal Problem:   CVA (cerebral vascular accident) ([place]) Active Problems:   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 Found to have bilateral white matter infarcts. Per neuro likely due to result of orthostatic hypotension. Possibly exacerbated by dialysis.  Other work-up with MRA, Echo w/o significant findings.Neuro recommend continuing current treatment w/ DAPT & Atorvastatin. No further work-up at the moment. -  Hold home anti-hypertensives - C/w aspirin, plavix, atorvastatin - Neuro checks - C/w TED hose - Possibly D/c w/ home health PT if bp stable after dialysis - May need midodrine if continuing to be orthostatic  ESRD on HD TTS Spoke with HD yesterday regarding volume status and prevent further reoccurrence of watershed infarcts. Due for dialysis this am. -HD per nephro -Continue home sevelamer carbonate 800mg  TID  Recovered chronic systolic heart failure Last ECHO 12/24/19 showed EF 35-40% with global hypokinesis and mild dilation of the LV and mild LVH, Grade II DD. Moderately elevated pulmonary systolic pressures with moderate MR. Patient clinically appears euvolemic on examination today. Echo revealed no wall motion abnormalities and no structural abnormalities with 60-65% EF. -Strict I&O  -Daily weights   DVT prophx: subqhep Diet: Renal Bowel: miralax Code: Full   Prior to Admission Living Arrangement: Home Anticipated Discharge Location: Home Barriers to Discharge: Medical treatment Dispo: Anticipated discharge in approximately 0-1 day(s).   Mosetta Anis, MD 02/20/2020, 12:59 PM Pager: [phone] After 5pm on weekdays and 1pm on weekends: On Call Pager: [phone]

## 2020-02-20 NOTE — Progress Notes (Signed)
[name] Progress Note  Subjective: see in room, no c/o. Seen by neuro who suspects watershed bilat infarcts related to hypotension w/ HD.   Vitals:   02/20/20 0355 02/20/20 0356 02/20/20 0813 02/20/20 1119  BP: (!) 137/58  (!) 148/70 (!) 177/78  Pulse: 72  68 68  Resp: 18  18 18   Temp: 98.6 F (37 C)  98.2 F (36.8 C) 98.7 F (37.1 C)  TempSrc: Oral     SpO2: 100%  100% 99%  Weight:  71 kg      Exam: General: Well developed, well nourished, in no acute distress. Head: Normocephalic, atraumatic, R eye with cloudy lens. Neck: Supple without lymphadenopathy/masses. JVD not elevated. Lungs: Clear bilaterally to auscultation without wheezes, rales, or rhonchi.  Heart: RRR with normal S1, S2. No murmurs, rubs, or gallops appreciated. Abdomen: Soft, non-tender, non-distended with normoactive bowel sounds. No rebound/guarding. No obvious abdominal masses. Musculoskeletal:  Strength and tone appear normal for age. Lower extremities: no sig LE edema; L toe wound not examined Neuro: Alert and oriented X 3. Moves all extremities spontaneously. Psych:  Responds to questions appropriately with a normal affect. Dialysis Access: [facility] R chest + R AVF + thrill    OP HD: TTS [facility] (started 01/23/20)  4h  400/600  69.5kg  2/2.25 bath  TDC   Hep 2000  - venofer 100 x 10, has 4 left  - mircera 100 q 2, hasn't started yet  - Hect 1 ug   Assessment/ Plan: 1. Acute Bilateral CVA: w/ sx's of R sided weakness and unresponsiveness off/ on x 3 episodes on 810. Admitted 8/11, MRI showed bilat white matter infarcts. MRA w/o sig occlusions. Seen by neuro who feels bilat CVA's are watershed findings related to hypotension (most likely HD related).  2.  Chronic L toe wound: S/p LE arteriogram 8/3 - angioplasty to posterior tibial artery. VVS consulted, good flow doppler to L foot now. 3.  ESRD: Continue usual TTS schedule. Next HD today 4.  Hypertension/volume: BP slightly high, no edema,  up 1-2 kg. Will let dry wt come up a bit especially given #1, raise to 70.5kg approx   5.  Anemia of ESRD: Hgb 8.6 - started ESA today + continue IV iron load  6.  Metabolic bone disease: Ca ok, Phos slightly high - just started on Renvela binders. 7. T2DM 8. CAD/combined HF 9. Dispo - for dc when dialysis completed per the primary team's notes     Dennis Macias 02/20/2020, 2:14 PM   Recent Labs  Lab 02/18/20 0152 02/20/20 0258  K 3.6 4.0  BUN 31* 25*  CREATININE 3.55* 2.90*  CALCIUM 8.7* 8.3*  PHOS 5.7* 3.5  HGB 8.6* 8.1*   Inpatient medications: . aspirin EC  81 mg Oral Daily  . atorvastatin  80 mg Oral QHS  . Chlorhexidine Gluconate Cloth  6 each Topical Q0600  . clopidogrel  75 mg Oral Daily  . dorzolamide-timolol  1 drop Right Eye BID  . finasteride  5 mg Oral Daily  . heparin  5,000 Units Subcutaneous Q8H  . insulin aspart  0-5 Units Subcutaneous QHS  . insulin aspart  0-9 Units Subcutaneous TID WC  . sevelamer carbonate  800 mg Oral 3 times per day on Sun Mon Wed Fri  . sevelamer carbonate  800 mg Oral 2 times per day on Tue Thu Sat  . vitamin B-12  1,000 mcg Oral Daily    acetaminophen, senna-docusate

## 2020-02-20 NOTE — Progress Notes (Addendum)
Examined and evaluated Dennis Macias afterwards. He mentions that he feels well. When asked about his symptoms while standing when checking repeat orthostatic vitals, he mentions not experiencing any dizziness or light-headedness. Discussed current work-up suggesting acute stroke being caused by orthostatic hypotension and need to continue physical therapy and not get up too fast. Also discussed discontinuing many of his meds and re-introducing them slowly to assist with compensatory mechanisms for his orthostatic hypotension. Dennis Macias expressed understanding and agrees with plan.  Attempted to speak with family members to confirm plan at home. Mrs.Potvin did not pick up. Left voicemail.

## 2020-02-21 LAB — GLUCOSE, CAPILLARY
Glucose-Capillary: 197 mg/dL — ABNORMAL HIGH (ref 70–99)
Glucose-Capillary: 217 mg/dL — ABNORMAL HIGH (ref 70–99)

## 2020-02-21 MED ORDER — HEPARIN SODIUM (PORCINE) 1000 UNIT/ML IJ SOLN
INTRAMUSCULAR | Status: AC
  Administered 2020-02-21: 1000 [IU]
  Filled 2020-02-21: qty 4

## 2020-02-21 NOTE — Discharge Summary (Signed)
Name: Dennis Macias MRN: [id number] DOB: 02-21-1951 69 y.o. PCP: [name], MD  Date of Admission: 02/17/2020 12:08 AM Date of Discharge: 02/21/2020 Attending Physician: Axel Filler, *  Discharge Diagnosis: 1. Acute white matter bilateral infarcts due to Orthostatic hypotension  Discharge Medications: Allergies as of 02/21/2020      Reactions   Cefepime Other (See Comments)   Pt had BAD encephalopathy from Cefepime      Medication List    STOP taking these medications   amLODipine 10 MG tablet Commonly known as: NORVASC   carvedilol 25 MG tablet Commonly known as: COREG   furosemide 80 MG tablet Commonly known as: LASIX   hydrALAZINE 25 MG tablet Commonly known as: APRESOLINE   hydrALAZINE 50 MG tablet Commonly known as: APRESOLINE   isosorbide mononitrate 60 MG 24 hr tablet Commonly known as: IMDUR   tamsulosin 0.4 MG Caps capsule Commonly known as: FLOMAX     TAKE these medications   Accu-Chek FastClix Lancets Misc Check blood sugar up to 7 times a week as instructed   Accu-Chek Guide test strip Generic drug: glucose blood Check blood sugar up to 7 times a week as instructed   Accu-Chek Guide w/Device Kit 1 each by Does not apply route daily. Check blood sugar as instructed up to 7 times a week   acetaminophen 500 MG tablet Commonly known as: TYLENOL Take 500 mg by mouth every 6 (six) hours as needed for moderate pain or headache.   aspirin EC 81 MG tablet Take 1 tablet (81 mg total) by mouth daily.   atorvastatin 80 MG tablet Commonly known as: LIPITOR Take 1 tablet (80 mg total) by mouth at bedtime. IM program   clopidogrel 75 MG tablet Commonly known as: PLAVIX TAKE 1 TABLET BY MOUTH ONCE DAILY.   dorzolamide-timolol 22.3-6.8 MG/ML ophthalmic solution Commonly known as: COSOPT Place 1 drop into the right eye 2 (two) times daily.   finasteride 5 MG tablet Commonly known as: PROSCAR Take 1 tablet (5 mg total) by mouth  daily.   polyethylene glycol 17 g packet Commonly known as: MIRALAX / GLYCOLAX Take 17 g by mouth daily as needed for moderate constipation (MIX AND DRINK).   sevelamer carbonate 800 MG tablet Commonly known as: RENVELA Take 800 mg by mouth See admin instructions. Take 800 mg by mouth three times a day with food on Sun/Mon/Wed/Fri and two times a day on Tues/Thurs/Sat   vitamin B-12 1000 MCG tablet Commonly known as: CYANOCOBALAMIN Take 1 tablet (1,000 mcg total) by mouth daily.       Disposition and follow-up:   [name] was discharged from [facility] in Stable condition.  At the hospital follow up visit please address:  1.  Acute white matter bilateral infarcts due to Orthostatic hypotension: Assess for any residual effects of new infarcts. Assess if orthostatic hypotension still present. Consider the addition of midodrine if additional support is needed.  2.  Labs / imaging needed at time of follow-up: BMP, orthostatic blood pressures  3.  Pending labs/ test needing follow-up: none  Follow-up Appointments:  Follow-up Information    Frann Rider, NP. Schedule an appointment as soon as possible for a visit in 4 week(s).   Specialty: Neurology Contact information: [id number] [place] [address] [phone]               Hospital Course by problem list: 1. Acute white matter bilateral infarcts due to Orthostatic hypotension: Was admitted  after 3 episodes of weakness and staring off. On MRI was found to have new acute white matter infarcts bilaterally. His weakness had resolved the day after admission. He was found on exam to have orthostatic hypotension. Due to the recent start of dialysis this was felt to be a driving factor. Consulted with Nephrology to discuss adjust his dry weight to prevent issues. Have also discontinued his Flomax and Carvedilol. Have also given him TED hose to further improve blood pressures when standing up. Will have  Home PT/OT continue to work with him.  Discharge Vitals:   BP (!) 165/72 (BP Location: Left Arm)   Pulse 70   Temp 99.5 F (37.5 C) (Oral)   Resp 18   Wt 71 kg   SpO2 100%   BMI 21.23 kg/m   Pertinent Labs, Studies, and Procedures:  BMP Latest Ref Rng & Units 02/20/2020 02/18/2020 02/17/2020  Glucose 70 - 99 mg/dL 229(H) 272(H) 272(H)  BUN 8 - 23 mg/dL 25(H) 31(H) 16  Creatinine 0.61 - 1.24 mg/dL 2.90(H) 3.55(H) 2.21(H)  BUN/Creat Ratio 10 - 24 - - -  Sodium 135 - 145 mmol/L 135 137 137  Potassium 3.5 - 5.1 mmol/L 4.0 3.6 3.6  Chloride 98 - 111 mmol/L 99 95(L) 96(L)  CO2 22 - 32 mmol/L 26 29 29   Calcium 8.9 - 10.3 mg/dL 8.3(L) 8.7(L) 8.6(L)   CBC Latest Ref Rng & Units 02/20/2020 02/18/2020 02/17/2020  WBC 4.0 - 10.5 K/uL 7.8 8.5 9.6  Hemoglobin 13.0 - 17.0 g/dL 8.1(L) 8.6(L) 8.5(L)  Hematocrit 39 - 52 % 25.7(L) 27.2(L) 27.5(L)  Platelets 150 - 400 K/uL 322 309 297    DG Chest 2 View: Virtually resolved small pleural effusions since last month. Stable cardiomegaly. No acute cardiopulmonary abnormality.   MR Brain WO Contrast: Acute white matter infarcts bilaterally. Moderate atrophy and advanced chronic microvascular ischemic changes in the white matter. Multiple areas of chronic microhemorrhage in both cerebral hemispheres. Correlate with hypertension history.   MR Angio Head WO Contrast: Technically limited exam due to motion artifact. Negative intracranial MRA with no large vessel occlusion. No new hemodynamically significant or correctable stenosis identified. 2 mm focal outpouching extending from anterior communicating artery complex, stable. Again, this could reflect a tiny aneurysm or vascular infundibulum.   Discharge Instructions: Discharge Instructions    Ambulatory referral to Neurology   Complete by: As directed    Follow up with stroke clinic NP (Jessica Vanschaick or Cecille Rubin, if both not available, consider Zachery Dauer, or Ahern) at [facility] in about 4  weeks. Thanks.   Call MD for:   Complete by: As directed    Call MD for:  difficulty breathing, headache or visual disturbances   Complete by: As directed    Call MD for:  extreme fatigue   Complete by: As directed    Call MD for:  hives   Complete by: As directed    Call MD for:  persistant dizziness or light-headedness   Complete by: As directed    Call MD for:  persistant nausea and vomiting   Complete by: As directed    Call MD for:  redness, tenderness, or signs of infection (pain, swelling, redness, odor or green/yellow discharge around incision site)   Complete by: As directed    Call MD for:  severe uncontrolled pain   Complete by: As directed    Call MD for:  temperature >100.4   Complete by: As directed    Diet - low sodium  heart healthy   Complete by: As directed    Discharge instructions   Complete by: As directed    Dear Mr. Scull, It was a pleasure taking care of you. You were admitted for strokes. You were found to have orthostatic hypotension, which means when you stand up your blood pressure drops too much. Please continue to work with PT at home to be more safe when ambulating around. Please keep your outpatient appointment with vascular to continue receiving treatment for your legs.  Thank you   Face-to-face encounter (required for Medicare/Medicaid patients)   Complete by: As directed    I Mosetta Anis certify that this patient is under my care and that I, or a nurse practitioner or physician's assistant working with me, had a face-to-face encounter that meets the physician face-to-face encounter requirements with this patient on 02/20/2020. The encounter with the patient was in whole, or in part for the following medical condition(s) which is the primary reason for home health care (List medical condition):   Acute CVA Chronic systolic heart failure Coronary Artery Disease End Stage Renal Disease   The encounter with the patient was in whole, or in part, for the  following medical condition, which is the primary reason for home health care: CVA   I certify that, based on my findings, the following services are medically necessary home health services: Physical therapy   Reason for Medically Necessary Home Health Services: Therapy- Home Adaptation to Facilitate Safety   My clinical findings support the need for the above services: Unsafe ambulation due to balance issues   Further, I certify that my clinical findings support that this patient is homebound due to: Unsafe ambulation due to balance issues   Home Health   Complete by: As directed    To provide the following care/treatments:  PT OT     Increase activity slowly   Complete by: As directed       Signed: Briant Cedar, MD 02/21/2020, 11:19 AM   Pager: [phone]

## 2020-02-21 NOTE — Care Management (Signed)
Notified [facility] of DC today.

## 2020-02-21 NOTE — Progress Notes (Addendum)
Subjective:  Interviewed patient at bedside.  He reports feeling well today. He states he has no dizziness and no headaches.States that he tolerated dialysis well yesterday with no dizziness and no weakness. He reports being at baseline. Discussed being able to discharge home today pending one last orthostatic check by his nurse.  Reported having some pain in his left foot requiring tylenol but it was controlled. Encouraged keeping his outpatient Vascular appointment that he already has scheduled.  Objective:  Vital signs in last 24 hours: Vitals:   02/21/20 0130 02/21/20 0154 02/21/20 0202 02/21/20 0418  BP: (!) 169/78 (!) 148/73 (!) 183/80 (!) 165/72  Pulse:   69 70  Resp:   18 18  Temp:   98.8 F (37.1 C) 99.5 F (37.5 C)  TempSrc:   Oral Oral  SpO2:   98% 100%  Weight:       Physical Exam Vitals and nursing note reviewed.  Constitutional:      General: He is not in acute distress.    Appearance: Normal appearance. He is normal weight. He is not ill-appearing or toxic-appearing.  HENT:     Head: Normocephalic and atraumatic.  Cardiovascular:     Rate and Rhythm: Normal rate and regular rhythm.     Pulses: Normal pulses.          Radial pulses are 2+ on the right side and 2+ on the left side.     Heart sounds: Normal heart sounds, S1 normal and S2 normal. No murmur heard.      Comments: Unable to feel Dorsalis Pedis pulses Pulmonary:     Effort: Pulmonary effort is normal. No respiratory distress.     Breath sounds: Normal breath sounds. No wheezing.  Musculoskeletal:     Right lower leg: No edema.     Left lower leg: No edema.  Neurological:     Mental Status: He is alert. Mental status is at baseline.  Psychiatric:        Mood and Affect: Mood normal.        Behavior: Behavior normal.        Thought Content: Thought content normal.      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   Acute white matter bilateral infarcts due to Orthostatic hypotension: Presented w/ transient R sided weakness. Found to have bilateral white matter infarcts. Per neuro likely due to result of orthostatic hypotension. Possibly exacerbated by dialysis. Other work-up with MRA, Echo w/o significant findings.Neuro recommend continuing current treatment w/ DAPT & Atorvastatin. No further work-up at the moment. -Hold home anti-hypertensives -Continue aspirin, plavix, atorvastatin -Neuro checks -Continue TED hose -Discharge today with home health PT  -Consider midodrine if continuing to be orthostatic   ESRD on HD TTS Spoke with HD yesterday regarding volume status and prevent further reoccurrence of watershed infarcts. Tolerated dialysis well yesterday. -HD per nephro -Continue home sevelamer carbonate 800mg  TID   Recovered chronic systolic heart failure Last ECHO 12/24/19 showed EF 35-40% with global hypokinesis and mild dilation of the LV and mild LVH, Grade  II DD. Moderately elevated pulmonary systolic pressures with moderate MR. Patient clinically appears euvolemic on examination today. Echo revealed no wall motion abnormalities and no structural abnormalities with 60-65% EF. -Strict I&O  -Daily weights    DVT prophx: subqhep Diet: Renal Bowel: miralax Code: Full   Prior to Admission Living Arrangement: Home Anticipated Discharge Location: Home Barriers to Discharge: Orthostatic Hypotension Dispo: Anticipated discharge today.   Briant Cedar, MD 02/21/2020, 6:13 AM Pager: [phone] After 5pm on  weekdays and 1pm on weekends: On Call pager [phone]

## 2020-02-21 NOTE — Progress Notes (Signed)
   02/21/20 0202  Hand-Off documentation  Handoff Given Given to shift RN/LPN  Report given to (Full Name) Sim Boast son, RN  Handoff Received Received from shift RN/LPN  Report received from (Full Name) Dsean Vantol  Vital Signs  Temp 98.8 F (37.1 C)  Temp Source Oral  Pulse Rate 69  Pulse Rate Source Monitor  Resp 18  BP (!) 183/80  BP Location Left Arm  BP Method Automatic  Patient Position (if appropriate) Lying  Oxygen Therapy  SpO2 98 %  O2 Device Room Air  Pain Assessment  Pain Scale 0-10  Pain Score 0  Post-Hemodialysis Assessment  Rinseback Volume (mL) 250 mL  KECN 258 V  Dialyzer Clearance Lightly streaked  Duration of HD Treatment -hour(s) 3.5 hour(s)  Hemodialysis Intake (mL) 500 mL  UF Total -Machine (mL) 1001 mL  Net UF (mL) 501 mL  Tolerated HD Treatment Yes  Post-Hemodialysis Comments tx achieved as expected, no complaints.  AVG/AVF Arterial Site Held (minutes) 0 minutes  Education / Care Plan  Dialysis Education Provided Yes  Hemodialysis Catheter Right Internal jugular Double lumen Permanent (Tunneled)  Placement Date/Time: 01/14/20 1438   Time Out: Correct patient;Correct site;Correct procedure  Maximum sterile barrier precautions: Hand hygiene;Cap;Mask;Sterile gown;Sterile gloves;Large sterile sheet  Site Prep: Chlorhexidine (preferred)  Local Anes...  Site Condition No complications  Blue Lumen Status Capped (Central line);Heparin locked  Red Lumen Status Heparin locked;Capped (Central line)  Catheter fill solution Heparin 1000 units/ml  Catheter fill volume (Arterial) 1.9 cc  Catheter fill volume (Venous) 1.9  Dressing Type Occlusive  Dressing Status Clean;Intact;Dry  Drainage Description None  Post treatment catheter status Capped and Clamped

## 2020-02-21 NOTE — Progress Notes (Signed)
NURSING PROGRESS NOTE  Justinian Miano [id number] Discharge Data: 02/21/2020 3:50 PM Attending Provider: Axel Filler, * [id number], Rylee, MD     Dorita Fray to be D/C'd per MD order.  Discussed with the patient and his significant other [name] the After Visit Summary and all questions fully answered. All IV's discontinued with no bleeding noted. All belongings returned to patient for patient to take home.   Last Vital Signs:  Blood pressure 134/72, pulse 65, temperature 99.6 F (37.6 C), temperature source Oral, resp. rate 18, weight 71 kg, SpO2 100 %.  Discharge Medication List Allergies as of 02/21/2020      Reactions   Cefepime Other (See Comments)   Pt had BAD encephalopathy from Cefepime      Medication List    STOP taking these medications   amLODipine 10 MG tablet Commonly known as: NORVASC   carvedilol 25 MG tablet Commonly known as: COREG   furosemide 80 MG tablet Commonly known as: LASIX   hydrALAZINE 25 MG tablet Commonly known as: APRESOLINE   hydrALAZINE 50 MG tablet Commonly known as: APRESOLINE   isosorbide mononitrate 60 MG 24 hr tablet Commonly known as: IMDUR   tamsulosin 0.4 MG Caps capsule Commonly known as: FLOMAX     TAKE these medications   Accu-Chek FastClix Lancets Misc Check blood sugar up to 7 times a week as instructed   Accu-Chek Guide test strip Generic drug: glucose blood Check blood sugar up to 7 times a week as instructed   Accu-Chek Guide w/Device Kit 1 each by Does not apply route daily. Check blood sugar as instructed up to 7 times a week   acetaminophen 500 MG tablet Commonly known as: TYLENOL Take 500 mg by mouth every 6 (six) hours as needed for moderate pain or headache.   aspirin EC 81 MG tablet Take 1 tablet (81 mg total) by mouth daily.   atorvastatin 80 MG tablet Commonly known as: LIPITOR Take 1 tablet (80 mg total) by mouth at bedtime. IM program   clopidogrel 75 MG tablet Commonly known as:  PLAVIX TAKE 1 TABLET BY MOUTH ONCE DAILY.   dorzolamide-timolol 22.3-6.8 MG/ML ophthalmic solution Commonly known as: COSOPT Place 1 drop into the right eye 2 (two) times daily.   finasteride 5 MG tablet Commonly known as: PROSCAR Take 1 tablet (5 mg total) by mouth daily.   polyethylene glycol 17 g packet Commonly known as: MIRALAX / GLYCOLAX Take 17 g by mouth daily as needed for moderate constipation (MIX AND DRINK).   sevelamer carbonate 800 MG tablet Commonly known as: RENVELA Take 800 mg by mouth See admin instructions. Take 800 mg by mouth three times a day with food on Sun/Mon/Wed/Fri and two times a day on Tues/Thurs/Sat   vitamin B-12 1000 MCG tablet Commonly known as: CYANOCOBALAMIN Take 1 tablet (1,000 mcg total) by mouth daily.

## 2020-02-21 NOTE — Progress Notes (Signed)
[facility] Progress Note  Subjective: see in room, no c/o. Seen by neuro who suspects watershed bilat infarcts related to hypotension w/ HD.   Vitals:   02/21/20 0130 02/21/20 0154 02/21/20 0202 02/21/20 0418  BP: (!) 169/78 (!) 148/73 (!) 183/80 (!) 165/72  Pulse:   69 70  Resp:   18 18  Temp:   98.8 F (37.1 C) 99.5 F (37.5 C)  TempSrc:   Oral Oral  SpO2:   98% 100%  Weight:        Exam: General: Well developed, well nourished, in no acute distress. Head: Normocephalic, atraumatic, R eye with cloudy lens. Neck: Supple without lymphadenopathy/masses. JVD not elevated. Lungs: Clear bilaterally to auscultation without wheezes, rales, or rhonchi.  Heart: RRR with normal S1, S2. No murmurs, rubs, or gallops appreciated. Abdomen: Soft, non-tender, non-distended with normoactive bowel sounds. No rebound/guarding. No obvious abdominal masses. Musculoskeletal:  Strength and tone appear normal for age. Lower extremities: no sig LE edema; L toe wound not examined Neuro: Alert and oriented X 3. Moves all extremities spontaneously. Psych:  Responds to questions appropriately with a normal affect. Dialysis Access: [facility] R chest + R AVF + thrill    OP HD: TTS [facility] (started 01/23/20)  4h  400/600  69.5kg  2/2.25 bath  TDC   Hep 2000  - venofer 100 x 10, has 4 left  - mircera 100 q 2, hasn't started yet  - Hect 1 ug   Assessment/ Plan: 1. Acute Bilateral CVA: w/ sx's of R sided weakness and unresponsiveness off/ on x 3 episodes on 810. Admitted 8/11, MRI showed bilat white matter infarcts. MRA w/o sig occlusions. Seen by neuro who feels bilat CVA's are watershed findings related to orthostatic hypotension and/or hypotension on HD. 2. Orthostatic hypotension: per pmd note, this is despite adjustment of dry wt and addition of compression stockings. Flomax dc'd here, avoid in future. Midodrine addition was considered but d/t concern about high supine BP's they are holding off for  now.  3.  Chronic L toe wound: S/p LE arteriogram 8/3 - angioplasty to posterior tibial artery. VVS consulted, good flow doppler to L foot now. 4.  ESRD: Continue usual TTS schedule. Had HD Sat. Next HD 8/17 5.  Hypertension/volume: BP high w/ orthostatic drops to 90- 100's. No vol excess on exam, 1.5kg over edw. Will raise edw by 1kg to 70.5kg. 6.  Anemia of ESRD: Hgb 8.6 - started ESA today + continue IV iron load  7.  Metabolic bone disease: Ca ok, Phos slightly high - just started on Renvela binders. 8. T2DM 9. CAD/combined HF 10. Dispo - for dc today , home PT planned.      Rob Jarek Longton 02/21/2020, 12:44 PM   Recent Labs  Lab 02/18/20 0152 02/20/20 0258  K 3.6 4.0  BUN 31* 25*  CREATININE 3.55* 2.90*  CALCIUM 8.7* 8.3*  PHOS 5.7* 3.5  HGB 8.6* 8.1*   Inpatient medications: . aspirin EC  81 mg Oral Daily  . atorvastatin  80 mg Oral QHS  . Chlorhexidine Gluconate Cloth  6 each Topical Q0600  . clopidogrel  75 mg Oral Daily  . dorzolamide-timolol  1 drop Right Eye BID  . finasteride  5 mg Oral Daily  . heparin  5,000 Units Subcutaneous Q8H  . insulin aspart  0-5 Units Subcutaneous QHS  . insulin aspart  0-9 Units Subcutaneous TID WC  . sevelamer carbonate  800 mg Oral 3 times per day on Sun  Mon Wed Fri  . sevelamer carbonate  800 mg Oral 2 times per day on Tue Thu Sat  . vitamin B-12  1,000 mcg Oral Daily    acetaminophen, senna-docusate

## 2020-02-22 ENCOUNTER — Telehealth: Payer: Medicare PPO

## 2020-02-22 ENCOUNTER — Telehealth (HOSPITAL_COMMUNITY): Payer: Self-pay | Admitting: Nephrology

## 2020-02-22 ENCOUNTER — Telehealth: Payer: Self-pay

## 2020-02-22 NOTE — Telephone Encounter (Signed)
Hospital TOC per Dr. Truman Hayward, appt 02/29/2020.

## 2020-02-22 NOTE — Telephone Encounter (Signed)
Transition of care contact from inpatient facility  Date of discharge: 02/21/2020 Date of contact: 02/22/2020 Method: Phone Spoke to: Patient  Patient called the [facility] dialysis unit back to discuss transition of care from recent inpatient hospitalization. Patient was admitted to [facility] from 8/11 - 02/21/2020 with discharge diagnosis of acute CVA  Medication changes were reviewed. Several BP medications reduced/held -- he has made the changes.  Patient will follow up with his/her outpatient HD unit on: tomorrow. Initially he said that he was planning to skip dialysis tomorrow. Eventually convinced him that he needs his full, regular HD treatments.  Denies additional needs at this time.  Veneta Penton, PA-C [employer] Pager [phone]

## 2020-02-22 NOTE — Telephone Encounter (Signed)
Transition of care contact from inpatient facility  Date of Discharge: 02/21/2020 Date of Contact: 02/22/2020 -- attempted Method of contact: Phone  Attempted to contact patient to discuss transition of care from inpatient admission. Patient did not answer the phone. Message was left on the patient's voicemail with call back number [phone].  Veneta Penton, PA-C [employer] Pager [phone]

## 2020-02-22 NOTE — Telephone Encounter (Signed)
  Chronic Care Management   Outreach Note  02/22/2020 Name: Lowen Barringer MRN: [id number] DOB: 1951-03-11  Referred by: [name], MD Reason for referral : Care Coordination (Medicaid application/in-home services)   An unsuccessful telephone outreach was attempted today. The patient was referred to the case management team for assistance with care management and care coordination.   Follow Up Plan: A HIPPA compliant phone message was left for the patient providing contact information and requesting a return call. Will attempt to reach again next week if no return call.      Ronn Melena, [place] Coordination Social Worker [place] [phone]

## 2020-02-24 ENCOUNTER — Telehealth: Payer: Self-pay | Admitting: *Deleted

## 2020-02-24 ENCOUNTER — Telehealth: Payer: Medicare PPO

## 2020-02-24 NOTE — Telephone Encounter (Signed)
Pts family called to inform office of foot discoloration. Pt has an appointment on 02/26/20. Assured pt this issue will be addressed at the appointment.

## 2020-02-25 ENCOUNTER — Telehealth: Payer: Self-pay

## 2020-02-25 NOTE — Telephone Encounter (Signed)
Returned call to [place], [place] with [facility]. No answer. Left message on VM requesting return call. Hubbard Hartshorn, RN, BSN

## 2020-02-25 NOTE — Telephone Encounter (Signed)
Agree  Thank you

## 2020-02-25 NOTE — Telephone Encounter (Signed)
Sharyn Lull called back, states she was scheduled to do [place] today but patient was at dialysis. He requested she return 03/02/2020 for Eval. Hubbard Hartshorn, BSN, RN-BC

## 2020-02-25 NOTE — Telephone Encounter (Signed)
Dennis Macias with [facility] requesting VO for OT. Please call back.

## 2020-02-26 ENCOUNTER — Other Ambulatory Visit: Payer: Self-pay

## 2020-02-26 ENCOUNTER — Ambulatory Visit (INDEPENDENT_AMBULATORY_CARE_PROVIDER_SITE_OTHER): Payer: Medicare PPO | Admitting: Physician Assistant

## 2020-02-26 ENCOUNTER — Ambulatory Visit (HOSPITAL_COMMUNITY)
Admission: RE | Admit: 2020-02-26 | Discharge: 2020-02-26 | Disposition: A | Discharge status: Home / Self Care | Payer: Medicare PPO | Source: Ambulatory Visit | Attending: Vascular Surgery | Admitting: Vascular Surgery

## 2020-02-26 VITALS — BP 141/70 | HR 76 | Temp 98.4°F | Resp 18 | Ht 72.0 in | Wt 157.0 lb

## 2020-02-26 DIAGNOSIS — I998 Other disorder of circulatory system: Secondary | ICD-10-CM

## 2020-02-26 DIAGNOSIS — I70229 Atherosclerosis of native arteries of extremities with rest pain, unspecified extremity: Secondary | ICD-10-CM

## 2020-02-26 DIAGNOSIS — I7025 Atherosclerosis of native arteries of other extremities with ulceration: Secondary | ICD-10-CM

## 2020-02-26 DIAGNOSIS — N186 End stage renal disease: Secondary | ICD-10-CM | POA: Insufficient documentation

## 2020-02-26 MED ORDER — OXYCODONE-ACETAMINOPHEN 5-325 MG PO TABS: 1.0000 | ORAL_TABLET | Freq: Four times a day (QID) | ORAL | 0 refills | Status: AC | PRN

## 2020-02-26 MED FILL — OXYCODONE-APAP 5-325MG: 5-325 | 5 days supply | Qty: 20 | Fill #0

## 2020-02-26 NOTE — Progress Notes (Signed)
Kindly inform the patient that heart monitor did not show any evidence of worrisome arrhythmias

## 2020-02-26 NOTE — Progress Notes (Signed)
POST OPERATIVE OFFICE NOTE    CC:  F/u for surgery  HPI:  This is a 69 y.o. male who is s/p right arm brachiocephalic AV fistula 01/18/44 by Dr. Donzetta Matters. His right arm presently is without steal symptoms. The incisions are well healed. He denies any pain, swelling, numbness or tingling. He currently dialyzes Tues/Thurs/ Sat via right IJ [facility]  He also recently underwent Abdominal aortogram with bilateral runoff and atherectomy and angioplasty of his left posterior tibial artery due to left great toe ulceration. This was performed by Dr. Trula Slade on 02/09/20. He had presented to the office on 02/08/20 due to new ulceration that was worsening despite course of antibiotics. On angiogram he was found to have severe tibial disease with occlusion of all tibial vessels. The posterior tibial artery was the only one that reconstituted at the ankle. This was noted to be very disease into the foot.  He presents today with significant left foot pain. He says he is unable to walk and pain is keeping him awake at night. He has been taking tylenol for the pain but it is not working. He says the pain is the most severe in the back of the leg and into the left heel. He has motor and sensation still intact. His wife states that the foot has become very swollen and red as well with worsening appearance since he had his procedure  Allergies  Allergen Reactions  . Cefepime Other (See Comments)    Pt had BAD encephalopathy from Cefepime    Current Outpatient Medications  Medication Sig Dispense Refill  . Accu-Chek FastClix Lancets MISC Check blood sugar up to 7 times a week as instructed 102 each 3  . acetaminophen (TYLENOL) 500 MG tablet Take 500 mg by mouth every 6 (six) hours as needed for moderate pain or headache.    Marland Kitchen aspirin EC 81 MG tablet Take 1 tablet (81 mg total) by mouth daily.    Marland Kitchen atorvastatin (LIPITOR) 80 MG tablet Take 1 tablet (80 mg total) by mouth at bedtime. IM program 90 tablet 3  . Blood Glucose  Monitoring Suppl (ACCU-CHEK GUIDE) w/Device KIT 1 each by Does not apply route daily. Check blood sugar as instructed up to 7 times a week 1 kit 0  . clopidogrel (PLAVIX) 75 MG tablet TAKE 1 TABLET BY MOUTH ONCE DAILY. (Patient taking differently: Take 75 mg by mouth daily. ) 30 tablet 1  . dorzolamide-timolol (COSOPT) 22.3-6.8 MG/ML ophthalmic solution Place 1 drop into the right eye 2 (two) times daily. 10 mL 10  . finasteride (PROSCAR) 5 MG tablet Take 1 tablet (5 mg total) by mouth daily. 30 tablet 0  . glucose blood (ACCU-CHEK GUIDE) test strip Check blood sugar up to 7 times a week as instructed 100 each 3  . polyethylene glycol (MIRALAX / GLYCOLAX) 17 g packet Take 17 g by mouth daily as needed for moderate constipation (MIX AND DRINK).     Marland Kitchen sevelamer carbonate (RENVELA) 800 MG tablet Take 800 mg by mouth See admin instructions. Take 800 mg by mouth three times a day with food on Sun/Mon/Wed/Fri and two times a day on Tues/Thurs/Sat    . vitamin B-12 (CYANOCOBALAMIN) 1000 MCG tablet Take 1 tablet (1,000 mcg total) by mouth daily. 30 tablet 0   No current facility-administered medications for this visit.     ROS:  See HPI  Physical Exam:  Vitals:   02/26/20 1425  BP: (!) 141/70  Pulse: 76  Resp: 18  Temp: 98.4 F (36.9 C)  TempSrc: Temporal  SpO2: 99%  Weight: 157 lb (71.2 kg)  Height: 6' (1.829 m)    General: thin, appears in a lot of discomfort but no acute distress Cardiac: regular rate and rhythm Lungs: non labored Extremities:  Right common femoral access site healed. No swelling or hematoma. 2+ femoral pulses bilaterally, 2+ popliteal pulses, Doppler left popliteal and PT signals. Left foot is warm. Mottling and erythematous petechial rash on the posterior medial left leg. Ischemic changes of the left 1st toe with black eschar on distal toe. Motor and sensation intact       Neuro: CN intact, alert and oriented    Non invasive vascular lab study:  02/26/20 +------------+----------+-------------+----------+----------------+  OUTFLOW VEINPSV (cm/s)Diameter (cm)Depth (cm)  Describe    +------------+----------+-------------+----------+----------------+  Shoulder    287    0.57     0.61            +------------+----------+-------------+----------+----------------+  Prox UA     129    0.54     0.55            +------------+----------+-------------+----------+----------------+  Mid UA     87    0.56     0.23  competing branch  +------------+----------+-------------+----------+----------------+  Dist UA     195    0.72     0.29  competing branch  +------------+----------+-------------+----------+----------------+  AC Fossa    373    0.80     0.30            +------------+----------+-------------+----------+----------------+     Assessment/Plan:  This is a 69 y.o. male who is s/ps/p right arm brachiocephalic AV fistula 12/04/39 by Dr. Donzetta Matters. This is well healed.His fistula is patent and of good diameter and depth for dialysis access - Patent is without signs or symptoms of steal syndrome - The patient's access will be ready for use 04/20/20 - The patient's tunneled dialysis catheter can be removed when Nephrology is comfortable with the performance of the right AV fistula - He can follow up as needed for this - He presents today with critical limb ischemia of the left lower extremity. He just had recent arteriogram with atherectomy and angioplasty of his posterior tibial artery on 02/09/20, but now has worsening symptoms. He has rest pain with worsening ulceration and ischemic changes of the distal left leg. Despite Doppler PT and popliteal signals I do not feel that his limb is going to be salvageable. Discussed with the patient and his wife that unfortunately will have to have an amputation if he is unable to tolerate the pain. I  suspect that he should be able to heal a below knee amputation but discussed with them that this is not guaranteed based on his blood flow. At this point they would like to hold off on amputation to see how he does. I will send prescription for pain medication to his pharmacy. He has his original follow up with Dr. Trula Slade scheduled for 03/07/20. I will have him keep this appointment but have advised him to call earlier if his pain becomes worse. Discussed with Dr. Donzetta Matters who also talked with the patient and his wife    Karoline Caldwell, [facility] [phone]  Clinic MD: Dr. Donzetta Matters

## 2020-02-29 ENCOUNTER — Other Ambulatory Visit: Payer: Self-pay | Admitting: Internal Medicine

## 2020-02-29 ENCOUNTER — Other Ambulatory Visit: Payer: Self-pay

## 2020-02-29 ENCOUNTER — Encounter: Payer: Self-pay | Admitting: Internal Medicine

## 2020-02-29 ENCOUNTER — Ambulatory Visit: Payer: Medicare PPO | Admitting: *Deleted

## 2020-02-29 ENCOUNTER — Ambulatory Visit (INDEPENDENT_AMBULATORY_CARE_PROVIDER_SITE_OTHER): Payer: Medicare PPO | Admitting: Internal Medicine

## 2020-02-29 ENCOUNTER — Encounter: Payer: Self-pay | Admitting: *Deleted

## 2020-02-29 VITALS — BP 124/65 | HR 78 | Temp 99.4°F | Ht 72.0 in | Wt 161.2 lb

## 2020-02-29 DIAGNOSIS — E785 Hyperlipidemia, unspecified: Secondary | ICD-10-CM

## 2020-02-29 DIAGNOSIS — I6389 Other cerebral infarction: Secondary | ICD-10-CM

## 2020-02-29 DIAGNOSIS — N400 Enlarged prostate without lower urinary tract symptoms: Secondary | ICD-10-CM | POA: Diagnosis not present

## 2020-02-29 DIAGNOSIS — I2510 Atherosclerotic heart disease of native coronary artery without angina pectoris: Secondary | ICD-10-CM

## 2020-02-29 DIAGNOSIS — I63232 Cerebral infarction due to unspecified occlusion or stenosis of left carotid arteries: Secondary | ICD-10-CM

## 2020-02-29 DIAGNOSIS — R338 Other retention of urine: Secondary | ICD-10-CM

## 2020-02-29 DIAGNOSIS — N186 End stage renal disease: Secondary | ICD-10-CM

## 2020-02-29 DIAGNOSIS — I739 Peripheral vascular disease, unspecified: Secondary | ICD-10-CM

## 2020-02-29 DIAGNOSIS — E11319 Type 2 diabetes mellitus with unspecified diabetic retinopathy without macular edema: Secondary | ICD-10-CM

## 2020-02-29 MED ORDER — CLOPIDOGREL BISULFATE 75 MG PO TABS: 75.0000 mg | ORAL_TABLET | Freq: Every day | ORAL | 1 refills | Status: DC

## 2020-02-29 MED ORDER — FINASTERIDE 5 MG PO TABS: 5.0000 mg | ORAL_TABLET | Freq: Every day | ORAL | 1 refills | Status: DC

## 2020-02-29 MED ORDER — ATORVASTATIN CALCIUM 80 MG PO TABS: 80.0000 mg | ORAL_TABLET | Freq: Every day | ORAL | 3 refills | Status: DC

## 2020-02-29 MED FILL — CLOPIDOGREL 75 MG TABLET: 75 | 90 days supply | Qty: 90 | Fill #0

## 2020-02-29 MED FILL — FINASTERIDE 5 MG TABLET: 5 | 90 days supply | Qty: 90 | Fill #0

## 2020-02-29 MED FILL — ATORVASTATIN 80 MG TABLET: 80 | 90 days supply | Qty: 90 | Fill #0

## 2020-02-29 NOTE — Patient Instructions (Addendum)
Thank you for trusting me with your care. To recap, today we discussed the following:  Stroke Your stroke was from low blood pressure ( hypotension). Your blood pressure was normal today.  Your blood pressure should be monitored with hemodialysis. Please let us know if you have anymore low blood pressures.   I reviewed the neurology notes and recommendation is to continue Plavix and Asprin.   Refilled Medications below: - atorvastatin (LIPITOR) 80 MG tablet; Take 1 tablet (80 mg total) by mouth at bedtime. IM program  Dispense: 90 tablet; Refill: 3 - finasteride (PROSCAR) 5 MG tablet; Take 1 tablet (5 mg total) by mouth daily.  Dispense: 90 tablet; Refill: 1 - clopidogrel (PLAVIX) 75 MG tablet; Take 1 tablet (75 mg total) by mouth daily.  Dispense: 90 tablet; Refill: 1

## 2020-02-29 NOTE — Chronic Care Management (AMB) (Signed)
Care Management   Follow Up Note   02/29/2020 Name: Dennis Macias MRN: [id number] DOB: [birth year]  Referred by: Dennis Hansen, MD Reason for referral : Chronic Care Management (NIDDM, HTN, HLD, ESRD, PAD w/critical limb ischemia on the left)   Dennis Macias is a 69 y.o. year old male who is a primary care patient of [name], [name], MD. The care management team was consulted for assistance with care management and care coordination needs.    Review of patient status, including review of consultants reports, relevant laboratory and other test results, and collaboration with appropriate care team members and the patient's provider was performed as part of comprehensive patient evaluation and provision of chronic care management services.    SDOH (Social Determinants of Health) assessments performed: No See Care Plan activities for detailed interventions related to [facility])     Advanced Directives: See Care Plan and [employer] application for related entries.   Goals Addressed              This Visit's Progress     Patient Stated   .  "I told the social worker I want to apply for Medicaid so I can get help in the home." (pt-stated)        [place] (see longitudinal plan of care for additional care plan information)   Current Barriers:  . Chronic Disease Management support, education, and care coordination needs related to CHF, CAD, HTN, HLD, DMII, and CKD Stage 3- met with patient and significant other and patient's caregiver Dennis Macias. She showed this CCM RN the letter she received from Dennis Macias indicating that patient qualified for Dennis Macias medical assistance only. She says she doesn't understand what the letter means.  Case Manager Clinical Goal(s):  Marland Kitchen Over the next 90 days, patient will work with Dennis Macias to address needs related to  applying for Medicaid  in patient with CHF, CAD, HTN, HLD, DMII, and CKD Stage 3  Interventions:  . Collaborated with Dennis Macias to initiate plan of care to  address needs related to  applying for Medicaid  in patient with CHF, CAD, HTN, HLD, DMII, and CKD Stage 3 - Explained that per the letter the patient received from [facility], he will receive assistance with payment of his Part B Medicare premium with retroactive payment from April 2021 . Referred Dennis Macias to the Medicaid case worker's name and phone number for further questions  Patient Self Care Activities:  . Patient verbalizes understanding of plan to work with Dennis Macias in applying for Medicaid  Please see past updates related to this goal by clicking on the "Past Updates" button in the selected goal      .  Significant other stated "Dennis Macias is doing OK, he had his first 3 dialysis treatments without problems" (pt-stated)        CARE PLAN ENTRY (see longitudinal plan of care for additional care plan information)  Current Barriers:  . Chronic Disease Management support and education needs related to ESRD, DM, HTN, HLD, HF and CAD- met with patient and his significant other and caregiver Dennis Macias during patient's clinic visit, patient states he is doing OK, tolerating dialysis, admits his left leg and foot hurt when asked,  Dennis Macias asked what alternating Tylenol with Oxycodone meant when the vascular PA told her she could do that when treating patient's critical ischemia pain in his left lower extremity.  She says patient does not check his blood sugars because his doctor told him since he doesn't take any medications for  his diabetes and his blood sugar control is good it is not necessary to monitor his blood sugar  Nurse Case Manager Clinical Goal(s):  Over the next 30 -60 days, patient's significant other  will:   Work with the care management team to address educational, disease management, and care coordination needs   Begin or continue self health monitoring activities as directed -  take medications as directed  Call provider office for new or worsened signs and symptoms related to CHF, CAD, HTN,  HLD, DMII and ESRD  Call care management team with questions or concerns  Verbalize basic understanding of patient centered plan of care established today  Interventions:  . Inter-disciplinary care team collaboration (see longitudinal plan of care) . Met with patient and caregiver and significant other Dennis Macias- discussed how to alternate Tylenol and Oxycodone and explained that oxycodone has tylenol in it in addition to the narcotic pain reliever oxycodone . Discussed likelihood that patient will require amputation when pain becomes too intense despite oxycodone . Reviewed scheduled/upcoming provider appointments including:  appointments with cardiology, neurology and podiatry . Advised patient's caregiver, providing education and rationale, to weigh daily and record, calling nephrologist (per direction of primary care team member Dr Koleen Distance) for weight gain of 3 lbs overnight or 5 pounds in a week. .  Discussed plans with patient for ongoing care management follow up and provided patient with direct contact information for care management team  Patient Self Care Activities:  . Patient takes medications as prescribed with caregiver's assistance . Attends all scheduled provider appointments . Caregiver calls pharmacy for medication refills . Caregiver calls provider office for new concerns or questions . Unable to self administer medications as prescribed . Unable to perform ADLs independently . Unable to perform IADLs independently  Please see past updates related to this goal by clicking on the "Past Updates" button in the selected goal          The care management team will reach out to the patient again over the next 30-60 days.   Dennis Churn RN, CCM, [facility] RN Care Manager [phone]

## 2020-02-29 NOTE — Patient Instructions (Signed)
Visit Information It was nice meeting [name] today; she takes very good care of you. Please let your vascular surgeon know if the pain in your left foot and/or leg becomes unbearable despite the pain medicine.  Goals Addressed              This Visit's Progress     Patient Stated   .  "I told the social worker I want to apply for Medicaid so I can get help in the home." (pt-stated)        [place] (see longitudinal plan of care for additional care plan information)   Current Barriers:  . Chronic Disease Management support, education, and care coordination needs related to CHF, CAD, HTN, HLD, DMII, and CKD Stage 3- met with patient and significant other and patient's caregiver Dixon Boos. She showed this CCM RN the letter she received from [facility] indicating that patient qualified for [facility] medical assistance only. She says she doesn't understand what the letter means.  Case Manager Clinical Goal(s):  Marland Kitchen Over the next 90 days, patient will work with BSW to address needs related to  applying for Medicaid  in patient with CHF, CAD, HTN, HLD, DMII, and CKD Stage 3  Interventions:  . Collaborated with BSW to initiate plan of care to address needs related to  applying for Medicaid  in patient with CHF, CAD, HTN, HLD, DMII, and CKD Stage 3 - Explained that per the letter the patient received from [facility], he will receive assistance with payment of his Part B Medicare premium with retroactive payment from April 2021 . Referred [name] to the Medicaid case worker's name and phone number for further questions  Patient Self Care Activities:  . Patient verbalizes understanding of plan to work with BSW in applying for Medicaid  Please see past updates related to this goal by clicking on the "Past Updates" button in the selected goal      .  Significant other stated "Mael is doing OK, he had his first 3 dialysis treatments without problems" (pt-stated)        CARE PLAN ENTRY (see  longitudinal plan of care for additional care plan information)  Current Barriers:  . Chronic Disease Management support and education needs related to ESRD, DM, HTN, HLD, HF and CAD- met with patient and his significant other and caregiver Dixon Boos during patient's clinic visit, patient states he is doing OK, tolerating dialysis, admits his left leg and foot hurt when asked,  [name] asked what alternating Tylenol with Oxycodone meant when the vascular PA told her she could do that when treating patient's critical ischemia pain in his left lower extremity.  She says patient does not check his blood sugars because his doctor told him since he doesn't take any medications for his diabetes and his blood sugar control is good it is not necessary to monitor his blood sugar  Nurse Case Manager Clinical Goal(s):  Over the next 30 -60 days, patient's significant other  will:   Work with the care management team to address educational, disease management, and care coordination needs   Begin or continue self health monitoring activities as directed -  take medications as directed  Call provider office for new or worsened signs and symptoms related to CHF, CAD, HTN, HLD, DMII and ESRD  Call care management team with questions or concerns  Verbalize basic understanding of patient centered plan of care established today  Interventions:  . Inter-disciplinary care team collaboration (see longitudinal plan  of care) . Met with patient and caregiver and significant other Amire Leazer- discussed how to alternate Tylenol and Oxycodone and explained that oxycodone has tylenol in it in addition to the narcotic pain reliever oxycodone . Discussed likelihood that patient will require amputation when pain becomes to intense despite oxycodone . Reviewed scheduled/upcoming provider appointments including:  appointments with cardiology, neurology and podiatry . Advised patient's caregiver, providing education and  rationale, to weigh daily and record, calling nephrologist (per direction of primary care team member Dr Koleen Distance) for weight gain of 3 lbs overnight or 5 pounds in a week. .  Discussed plans with patient for ongoing care management follow up and provided patient with direct contact information for care management team  Patient Self Care Activities:  . Patient takes medications as prescribed with caregiver's assistance . Attends all scheduled provider appointments . Caregiver calls pharmacy for medication refills . Caregiver calls provider office for new concerns or questions . Unable to self administer medications as prescribed . Unable to perform ADLs independently . Unable to perform IADLs independently  Please see past updates related to this goal by clicking on the "Past Updates" button in the selected goal         The patient verbalized understanding of instructions provided today and declined a print copy of patient instruction materials.   The care management team will reach out to the patient again over the next 30-60 days.   Kelli Churn RN, CCM, [facility] RN Care Manager [phone]

## 2020-02-29 NOTE — Progress Notes (Signed)
   CC: Stroke   HPI:[name] is a 69 y.o. male who presents for evaluation of recent stroke Please see individual problem based A/P for details.   Past Medical History:  Diagnosis Date  . Anemia   . Arthritis    past hx   . Blindness    right eye  . Cardiorenal syndrome   . Cataract    removed both eyes  . Dehydration   . Diabetes ([place])   . Glaucoma   . History of CVA (cerebrovascular accident) 09/13/2015  . History of stroke 09/13/2015  . History of urinary retention   . Hyperlipidemia   . Hypertension   . NSTEMI (non-ST elevated myocardial infarction) ([place])   . Pernicious anemia 02/24/2018  . S/P TURP   . Stroke [facility])    2017- March  . Syncope 11/2019  . Tachycardia 08/26/2017  . Tubular adenoma of colon 02/2017  . Weight loss, non-intentional 08/26/2017   10 lbs between 6/18 & 2/19   Review of Systems:   Review of Systems  Constitutional: Negative for chills and fever.  Neurological: Negative for dizziness, sensory change, speech change, focal weakness and loss of consciousness.     Physical Exam: Vitals:   02/29/20 1322  BP: 124/65  Pulse: 78  Temp: 99.4 F (37.4 C)  TempSrc: Oral  SpO2: 98%  Weight: 161 lb 3.2 oz (73.1 kg)  Height: 6' (1.829 m)   General: NAD, nl appearance HEENT: Normocephalic, atraumatic , Conjunctiva nl  Cardiovascular: Normal rate, regular rhythm.  No murmurs, rubs, or gallops Pulmonary : Equal breath sounds, No wheezes, rales, or rhonchi Abdominal: soft, nontender,  bowel sounds present Neuro: Alert and oriented x4, Patient blind in R eye. Otherwise CN II-XII intact, no focal motor weakness, sensation intact   Assessment & Plan:   See Encounters Tab for problem based charting.  Patient discussed with Dr. Evette Doffing

## 2020-03-01 ENCOUNTER — Emergency Department (HOSPITAL_COMMUNITY)
Admission: EM | Admit: 2020-03-01 | Discharge: 2020-03-02 | Disposition: A | Discharge status: Home / Self Care | Payer: Medicare PPO | Attending: Emergency Medicine | Admitting: Emergency Medicine

## 2020-03-01 ENCOUNTER — Other Ambulatory Visit: Payer: Self-pay

## 2020-03-01 ENCOUNTER — Encounter: Payer: Self-pay | Admitting: Internal Medicine

## 2020-03-01 DIAGNOSIS — I132 Hypertensive heart and chronic kidney disease with heart failure and with stage 5 chronic kidney disease, or end stage renal disease: Secondary | ICD-10-CM | POA: Insufficient documentation

## 2020-03-01 DIAGNOSIS — Z992 Dependence on renal dialysis: Secondary | ICD-10-CM | POA: Diagnosis not present

## 2020-03-01 DIAGNOSIS — Z7982 Long term (current) use of aspirin: Secondary | ICD-10-CM | POA: Diagnosis not present

## 2020-03-01 DIAGNOSIS — R531 Weakness: Secondary | ICD-10-CM

## 2020-03-01 DIAGNOSIS — N186 End stage renal disease: Secondary | ICD-10-CM | POA: Diagnosis not present

## 2020-03-01 DIAGNOSIS — I5043 Acute on chronic combined systolic (congestive) and diastolic (congestive) heart failure: Secondary | ICD-10-CM | POA: Diagnosis not present

## 2020-03-01 DIAGNOSIS — R55 Syncope and collapse: Secondary | ICD-10-CM | POA: Insufficient documentation

## 2020-03-01 DIAGNOSIS — Z79899 Other long term (current) drug therapy: Secondary | ICD-10-CM | POA: Insufficient documentation

## 2020-03-01 DIAGNOSIS — E119 Type 2 diabetes mellitus without complications: Secondary | ICD-10-CM | POA: Diagnosis not present

## 2020-03-01 DIAGNOSIS — Z87891 Personal history of nicotine dependence: Secondary | ICD-10-CM | POA: Diagnosis not present

## 2020-03-01 DIAGNOSIS — N189 Chronic kidney disease, unspecified: Secondary | ICD-10-CM

## 2020-03-01 DIAGNOSIS — D631 Anemia in chronic kidney disease: Secondary | ICD-10-CM | POA: Diagnosis not present

## 2020-03-01 LAB — CBC
HCT: 27.6 % — ABNORMAL LOW (ref 39.0–52.0)
Hemoglobin: 8.4 g/dL — ABNORMAL LOW (ref 13.0–17.0)
MCH: 28 pg (ref 26.0–34.0)
MCHC: 30.4 g/dL (ref 30.0–36.0)
MCV: 92 fL (ref 80.0–100.0)
Platelets: 371 10*3/uL (ref 150–400)
RBC: 3 MIL/uL — ABNORMAL LOW (ref 4.22–5.81)
RDW: 14.4 % (ref 11.5–15.5)
WBC: 14.5 10*3/uL — ABNORMAL HIGH (ref 4.0–10.5)
nRBC: 0 % (ref 0.0–0.2)

## 2020-03-01 LAB — BASIC METABOLIC PANEL
Anion gap: 13 (ref 5–15)
BUN: 14 mg/dL (ref 8–23)
CO2: 28 mmol/L (ref 22–32)
Calcium: 8.7 mg/dL — ABNORMAL LOW (ref 8.9–10.3)
Chloride: 95 mmol/L — ABNORMAL LOW (ref 98–111)
Creatinine, Ser: 2.23 mg/dL — ABNORMAL HIGH (ref 0.61–1.24)
GFR calc Af Amer: 34 mL/min — ABNORMAL LOW (ref >60)
GFR calc non Af Amer: 29 mL/min — ABNORMAL LOW (ref >60)
Glucose, Bld: 229 mg/dL — ABNORMAL HIGH (ref 70–99)
Potassium: 3.8 mmol/L (ref 3.5–5.1)
Sodium: 136 mmol/L (ref 135–145)

## 2020-03-01 NOTE — Progress Notes (Signed)
Internal Medicine Clinic Attending  Case discussed with Dr. Steen  At the time of the visit.  We reviewed the resident's history and exam and pertinent patient test results.  I agree with the assessment, diagnosis, and plan of care documented in the resident's note.  

## 2020-03-01 NOTE — Assessment & Plan Note (Signed)
  Refilled - atorvastatin (LIPITOR) 80 MG tablet; Take 1 tablet (80 mg total) by mouth at bedtime. IM program  Dispense: 90 tablet; Refill: 3

## 2020-03-01 NOTE — Assessment & Plan Note (Signed)
Refilled - finasteride (PROSCAR) 5 MG tablet; Take 1 tablet (5 mg total) by mouth daily.  Dispense: 90 tablet; Refill: 1

## 2020-03-01 NOTE — Assessment & Plan Note (Signed)
Dennis Macias presented with his significant other ,Dennis Macias, today for hospital follow up. He was admitted with acute bilateral cerebral white matter infarct. Also noted to have moderate atrophy and advanced chronic microvascular ischemic changes in the white matter.Multiple areas of chronic microhemorrhage in both cerebral hemispheres, likely related to history of hypertension. Patient had progression of CKD to ESRD in July of this year and started on HD.  Patient was orthostatic in the hospital. He was on Amlodipine, Hydralazine, Carvedilol, Imdur, and Flomax ( BPH) on admission. Thought was it was related to HD and a noted the nephrologist are adjusting to prevention hypotension.    Orthostatics today Lying 144/70, Sitting 121/65, Standing 127/67. Patient hemodynamically stable in all positions and asymptomatic.  Plan: - Continue holding all antihypertensives - Follow up with Neurology 9/13 - continue DAPT ( Plavix and Asprin) Follow up with neurology to discuss.

## 2020-03-01 NOTE — ED Triage Notes (Signed)
On arrival home from dialysis today, pt became weak while walking inside. People with him assisted him to sit down, at which time he drew up his legs and became contracted and altered. Symptoms resolved in a matter of minutes. A/O x 4 on arrival to ED.

## 2020-03-01 NOTE — Assessment & Plan Note (Addendum)
Patient previously had hematuria with Asprin. Patient put on Plavix since ischemic stroke in May 2021 and continue on Plavix for treatment after NSTEMI. He was instructed to stop Asprin , but when followed up by neurology he was on both medications. Patients significant other who manages his medications reported he had no hematuria. Given the aformentioned, patient was continued on Plavix and Asprin at recent admission. He will have follow up with neurology in September.    -Refill Plavix

## 2020-03-02 ENCOUNTER — Telehealth: Payer: Self-pay | Admitting: *Deleted

## 2020-03-02 ENCOUNTER — Telehealth: Payer: Medicare PPO

## 2020-03-02 ENCOUNTER — Other Ambulatory Visit: Payer: Self-pay

## 2020-03-02 LAB — URINALYSIS, ROUTINE W REFLEX MICROSCOPIC
Bilirubin Urine: NEGATIVE
Glucose, UA: >=500 mg/dL — AB
Hgb urine dipstick: NEGATIVE
Ketones, ur: NEGATIVE mg/dL
Nitrite: NEGATIVE
Protein, ur: >=300 mg/dL — AB
Specific Gravity, Urine: 1.017 (ref 1.005–1.030)
WBC, UA: >50 WBC/hpf — ABNORMAL HIGH (ref 0–5)
pH: 7 (ref 5.0–8.0)

## 2020-03-02 NOTE — ED Provider Notes (Signed)
[facility] EMERGENCY DEPARTMENT Provider Note   CSN: [id number] Arrival date & time: 03/01/20  1758   History Chief Complaint  Patient presents with  . Near Syncope    Dennis Macias is a 69 y.o. male.  The history is provided by the patient.  Near Syncope  He has history of diabetes, hypertension, hyperlipidemia, end-stage renal disease on hemodialysis, stroke, coronary artery disease, combined systolic and diastolic heart failure and comes in following an episode of weakness. He was in his usual dialysis session yesterday. He completed dialysis and, when he got home, he states that he got tired as he was walking down 6 flights of steps. This is what he has to negotiate on a normal basis. He sat down and, the people who were with him noted that he drew his legs up and seemed to have some altered mentation. This lasted a very brief amount of time (less than 2 minutes). There was no shaking noted and no incontinence. There was no bit lip or tongue. He feels that he is back to normal. He has been in the ED for over 12 hours with no further symptoms.  Past Medical History:  Diagnosis Date  . Anemia   . Arthritis    past hx   . Blindness    right eye  . Cardiorenal syndrome   . Cataract    removed both eyes  . Dehydration   . Diabetes ([place])   . Glaucoma   . History of CVA (cerebrovascular accident) 09/13/2015  . History of stroke 09/13/2015  . History of urinary retention   . Hyperlipidemia   . Hypertension   . NSTEMI (non-ST elevated myocardial infarction) ([place])   . Pernicious anemia 02/24/2018  . S/P TURP   . Stroke [facility])    2017- March  . Syncope 11/2019  . Tachycardia 08/26/2017  . Tubular adenoma of colon 02/2017  . Weight loss, non-intentional 08/26/2017   10 lbs between 6/18 & 2/19    Patient Active Problem List   Diagnosis Date Noted  . PAD (peripheral artery disease) ([place]) 02/18/2020  . Orthostatic hypotension 02/18/2020  . CVA (cerebral vascular  accident) ([place]) 02/17/2020  . ESRD (end stage renal disease) ([place])   . Goals of care, counseling/discussion   . Acute on chronic combined systolic and diastolic CHF (congestive heart failure) ([place]) 12/24/2019  . Non-Obstructive CAD 12/14/2019  . BPH (benign prostatic hyperplasia) 12/08/2019  . Anemia of chronic disease   . Protein-calorie malnutrition, severe 11/19/2019  . Gait disorder   . Chronic combined systolic and diastolic heart failure ([place])   . Left renal mass 07/21/2019  . CKD (chronic kidney disease) stage 4, GFR 15-29 ml/min (H[facility]) 04/23/2019  . Weight loss, non-intentional 08/26/2017  . 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 Acute on chronic anemia 12/21/2016  . Hyperlipidemia 09/13/2015  . Diabetes mellitus with retinopathy of both eyes ([place])   . Essential hypertension     Past Surgical History:  Procedure Laterality Date  . ABDOMINAL AORTOGRAM W/LOWER EXTREMITY N/A 02/09/2020   Procedure: ABDOMINAL AORTOGRAM W/LOWER EXTREMITY;  Surgeon: Serafina Mitchell, MD;  Location: [place] CV LAB;  Service: Cardiovascular;  Laterality: N/A;  . AV FISTULA PLACEMENT Right 01/19/2020   Procedure: BRACHIOCEPHALIC ARTERIOVENOUS (AV) FISTULA CREATION;  Surgeon: Waynetta Sandy, MD;  Location: [facility];  Service: Vascular;  Laterality: Right;  . CATARACT EXTRACTION, BILATERAL    . COLONOSCOPY    . IR FLUORO GUIDE CV LINE RIGHT  01/14/2020  .  IR THORACENTESIS ASP PLEURAL SPACE W/IMG GUIDE  09/21/2019  . IR THORACENTESIS ASP PLEURAL SPACE W/IMG GUIDE  10/16/2019  . IR US GUIDE VASC ACCESS RIGHT  01/15/2020  . LEFT HEART CATH AND CORONARY ANGIOGRAPHY N/A 11/19/2019   Procedure: LEFT HEART CATH AND CORONARY ANGIOGRAPHY;  Surgeon: Jettie Booze, MD;  Location: [place] CV LAB;  Service: Cardiovascular;  Laterality: N/A;  . PERIPHERAL VASCULAR INTERVENTION Left 02/09/2020   Procedure: PERIPHERAL VASCULAR INTERVENTION;  Surgeon: Serafina Mitchell, MD;  Location: [place] CV LAB;  Service:  Cardiovascular;  Laterality: Left;  . POLYPECTOMY    . REFRACTIVE SURGERY  10/2017  . TEE WITHOUT CARDIOVERSION N/A 09/14/2015   Procedure: TRANSESOPHAGEAL ECHOCARDIOGRAM (TEE);  Surgeon: Larey Dresser, MD;  Location: [place];  Service: Cardiovascular;  Laterality: N/A;  . TRANSURETHRAL RESECTION OF PROSTATE N/A 10/20/2019   Procedure: TRANSURETHRAL RESECTION OF THE PROSTATE (TURP);  Surgeon: Irine Seal, MD;  Location: [facility];  Service: Urology;  Laterality: N/A;  . UPPER GASTROINTESTINAL ENDOSCOPY         Family History  Problem Relation Age of Onset  . Hypertension Mother   . Hyperlipidemia Mother   . Hyperlipidemia Father   . Colon cancer Neg Hx   . Colon polyps Neg Hx   . Esophageal cancer Neg Hx   . Rectal cancer Neg Hx   . Stomach cancer Neg Hx     Social History   Tobacco Use  . Smoking status: Former Research scientist (life sciences)  . Smokeless tobacco: Former Systems developer    Types: Chew    Quit date: 07/09/1978  . Tobacco comment: quit 1 year ago  Vaping Use  . Vaping Use: Never used  Substance Use Topics  . Alcohol use: No    Alcohol/week: 1.0 standard drink    Types: 1 Cans of beer per week    Comment: quit last march/2017  . Drug use: No    Home Medications Prior to Admission medications   Medication Sig Start Date End Date Taking? Authorizing Provider  Accu-Chek FastClix Lancets MISC Check blood sugar up to 7 times a week as instructed 12/03/19   Angiulli, Lavon Paganini, PA-C  acetaminophen (TYLENOL) 500 MG tablet Take 500 mg by mouth every 6 (six) hours as needed for moderate pain or headache.    [provider]  aspirin EC 81 MG tablet Take 1 tablet (81 mg total) by mouth daily. 10/22/19   Eugenie Filler, MD  atorvastatin (LIPITOR) 80 MG tablet Take 1 tablet (80 mg total) by mouth at bedtime. IM program 02/29/20   Madalyn Rob, MD  Blood Glucose Monitoring Suppl (ACCU-CHEK GUIDE) w/Device KIT 1 each by Does not apply route daily. Check blood sugar as instructed up to 7 times a  week 12/03/19   Angiulli, Lavon Paganini, PA-C  clopidogrel (PLAVIX) 75 MG tablet Take 1 tablet (75 mg total) by mouth daily. 02/29/20   Madalyn Rob, MD  dorzolamide-timolol (COSOPT) 22.3-6.8 MG/ML ophthalmic solution Place 1 drop into the right eye 2 (two) times daily. 12/03/19   Angiulli, Lavon Paganini, PA-C  finasteride (PROSCAR) 5 MG tablet Take 1 tablet (5 mg total) by mouth daily. 02/29/20   Madalyn Rob, MD  glucose blood (ACCU-CHEK GUIDE) test strip Check blood sugar up to 7 times a week as instructed 12/03/19   Angiulli, Lavon Paganini, PA-C  oxyCODONE-acetaminophen (PERCOCET) 5-325 MG tablet Take 1 tablet by mouth every 6 (six) hours as needed for up to 10 days for severe pain. 02/26/20  03/07/20  Baglia, Corrina, PA-C  polyethylene glycol (MIRALAX / GLYCOLAX) 17 g packet Take 17 g by mouth daily as needed for moderate constipation (MIX AND DRINK).     [provider]  sevelamer carbonate (RENVELA) 800 MG tablet Take 800 mg by mouth See admin instructions. Take 800 mg by mouth three times a day with food on Sun/Mon/Wed/Fri and two times a day on Tues/Thurs/Sat    [provider]  vitamin B-12 (CYANOCOBALAMIN) 1000 MCG tablet Take 1 tablet (1,000 mcg total) by mouth daily. 12/03/19   Angiulli, Lavon Paganini, PA-C    Allergies    Cefepime  Review of Systems   Review of Systems  Cardiovascular: Positive for near-syncope.  All other systems reviewed and are negative.   Physical Exam Updated Vital Signs BP (!) 167/82 (BP Location: Right Arm)   Pulse 72   Temp 98.9 F (37.2 C) (Oral)   Resp 17   SpO2 100%   Physical Exam Vitals and nursing note reviewed.   69 year old male, resting comfortably and in no acute distress. Vital signs are significant for elevated blood pressure. Oxygen saturation is 100%, which is normal. Head is normocephalic and atraumatic. PERRLA, EOMI. Oropharynx is clear. Neck is nontender and supple without adenopathy or JVD. There are no carotid bruits. Back is  nontender and there is no CVA tenderness. Lungs are clear without rales, wheezes, or rhonchi. Chest is nontender. Heart has regular rate and rhythm without murmur. Abdomen is soft, flat, nontender without masses or hepatosplenomegaly and peristalsis is normoactive. Extremities have no cyanosis or edema, full range of motion is present. AV fistula is present in the right arm with thrill present. Skin is warm and dry without rash. Neurologic: Mental status is normal, cranial nerves are intact, there are no motor or sensory deficits.  ED Results / Procedures / Treatments   Labs (all labs ordered are listed, but only abnormal results are displayed) Labs Reviewed  BASIC METABOLIC PANEL - Abnormal; Notable for the following components:      Result Value   Chloride 95 (*)    Glucose, Bld 229 (*)    Creatinine, Ser 2.23 (*)    Calcium 8.7 (*)    GFR calc non Af Amer 29 (*)    GFR calc Af Amer 34 (*)    All other components within normal limits  CBC - Abnormal; Notable for the following components:   WBC 14.5 (*)    RBC 3.00 (*)    Hemoglobin 8.4 (*)    HCT 27.6 (*)    All other components within normal limits  URINALYSIS, ROUTINE W REFLEX MICROSCOPIC    EKG EKG Interpretation  Date/Time:  Tuesday March 01 2020 18:04:47 EDT Ventricular Rate:  78 PR Interval:  170 QRS Duration: 150 QT Interval:  492 QTC Calculation: 560 R Axis:   -92 Text Interpretation: Normal sinus rhythm Right bundle branch block Left ventricular hypertrophy with repolarization abnormality ( R in aVL , Romhilt-Estes ) Possible Anteroseptal infarct , age undetermined Abnormal ECG When compared with ECG of 02/18/2020, No significant change was found Confirmed by Delora Fuel ([id number]) on 03/01/2020 11:40:31 PM  Procedures Procedures  Medications Ordered in ED Medications - No data to display  ED Course  I have reviewed the triage vital signs and the nursing notes.  Pertinent lab results that were available  during my care of the patient were reviewed by me and considered in my medical decision making (see chart for details).  MDM Rules/Calculators/A&P Episode of weakness with possible brief, transient episode of altered mentation. Exam today is benign. ECG shows right bundle branch block and LVH, unchanged from prior. Labs show mildly elevated glucose, elevated BUN and creatinine consistent with known end-stage renal disease, moderate anemia which is actually improved over baseline. I suspect that patient had some effects of electrolyte shifts secondary to dialysis. No evidence of serious pathology. He is felt to be safe for discharge. Will check orthostatic vital signs prior to discharge.  Orthostatic vital signs showed no significant change in pulse or blood pressure.  He is discharged with instructions to follow-up with his primary care provider and his nephrologist.  Final Clinical Impression(s) / ED Diagnoses Final diagnoses:  Weakness  End-stage renal disease on hemodialysis ([place])  Anemia associated with chronic renal failure    Rx / DC Orders ED Discharge Orders    None       Delora Fuel, MD 74/94/49 [phone]

## 2020-03-02 NOTE — Discharge Instructions (Addendum)
Return if you have any problems.

## 2020-03-02 NOTE — Telephone Encounter (Addendum)
  Chronic Care Management   Outreach Note  03/02/2020 Name: Dennis Macias MRN: [id number] DOB: [birth year]  Referred by: [name], MD Reason for referral : Chronic Care Management (NIDDM< HTN, HLD, ESRD)   An unsuccessful telephone outreach to patient's caregiver and significant other was attempted today to follow up on patient's visi to emergency department yesterday.  The patient was referred to the case management team for assistance with care management and care coordination.   Follow Up Plan: A HIPPA compliant phone message was left for the patient's caregiver providing contact information and requesting a return call.  The care management team will reach out to the patient again over the next 7-10 days.   Kelli Churn RN, CCM, [facility] RN Care Manager [phone]

## 2020-03-02 NOTE — Telephone Encounter (Signed)
  Chronic Care Management   Outreach Note  03/02/2020 Name: Dennis Macias MRN: [id number] DOB: 1950-08-25  Referred by: [name], MD Reason for referral : Chronic Care Management (NIDDM, HTN, HLD, ESRD, HF, CAD, s/p stroke)   An unsuccessful telephone outreach was attempted today to reach [place]' significant other and caregiver Dixon Boos. The patient was referred to the case management team for assistance with care management and care coordination.   Follow Up Plan: A HIPPA compliant phone message was left for the patient's significant other and caregiver providing contact information and requesting a return call.   Kelli Churn RN, CCM, [facility] RN Care Manager [phone]

## 2020-03-02 NOTE — Progress Notes (Signed)
Internal Medicine Clinic Resident  I have personally reviewed this encounter including the documentation in this note and/or discussed this patient with the care management provider. I will address any urgent items identified by the care management provider and will communicate my actions to the patient's PCP. I have reviewed the patient's CCM visit with my supervising attending, Dr Vincent.  Iulia Basaraba, MD 03/02/2020    

## 2020-03-03 ENCOUNTER — Ambulatory Visit: Payer: Medicare PPO | Admitting: *Deleted

## 2020-03-03 DIAGNOSIS — E11319 Type 2 diabetes mellitus with unspecified diabetic retinopathy without macular edema: Secondary | ICD-10-CM

## 2020-03-03 DIAGNOSIS — E785 Hyperlipidemia, unspecified: Secondary | ICD-10-CM

## 2020-03-03 DIAGNOSIS — N186 End stage renal disease: Secondary | ICD-10-CM

## 2020-03-03 DIAGNOSIS — I6389 Other cerebral infarction: Secondary | ICD-10-CM

## 2020-03-03 DIAGNOSIS — I739 Peripheral vascular disease, unspecified: Secondary | ICD-10-CM

## 2020-03-03 NOTE — Chronic Care Management (AMB) (Signed)
Chronic Care Management   Follow Up Note   03/03/2020 Name: Dennis Macias MRN: [id number] DOB: 1950-11-25  Referred by: [name], MD Reason for referral : Chronic Care Management (ESRD, NIDDM, HTN, HLD)   Dennis Macias is a 69 y.o. year old male who is a primary care patient of [name], [name], MD. The CCM team was consulted for assistance with chronic disease management and care coordination needs.    Review of patient status, including review of consultants reports, relevant laboratory and other test results, and collaboration with appropriate care team members and the patient's provider was performed as part of comprehensive patient evaluation and provision of chronic care management services.    SDOH (Social Determinants of Health) assessments performed: No See Care Plan activities for detailed interventions related to [facility])     Outpatient Encounter Medications as of 03/03/2020  Medication Sig Note  . Accu-Chek FastClix Lancets MISC Check blood sugar up to 7 times a week as instructed   . acetaminophen (TYLENOL) 500 MG tablet Take 500 mg by mouth every 6 (six) hours as needed for moderate pain or headache.   Marland Kitchen aspirin EC 81 MG tablet Take 1 tablet (81 mg total) by mouth daily.   Marland Kitchen atorvastatin (LIPITOR) 80 MG tablet Take 1 tablet (80 mg total) by mouth at bedtime. IM program   . Blood Glucose Monitoring Suppl (ACCU-CHEK GUIDE) w/Device KIT 1 each by Does not apply route daily. Check blood sugar as instructed up to 7 times a week   . clopidogrel (PLAVIX) 75 MG tablet Take 1 tablet (75 mg total) by mouth daily.   . dorzolamide-timolol (COSOPT) 22.3-6.8 MG/ML ophthalmic solution Place 1 drop into the right eye 2 (two) times daily.   . finasteride (PROSCAR) 5 MG tablet Take 1 tablet (5 mg total) by mouth daily.   Marland Kitchen glucose blood (ACCU-CHEK GUIDE) test strip Check blood sugar up to 7 times a week as instructed   . oxyCODONE-acetaminophen (PERCOCET) 5-325 MG tablet Take 1 tablet by  mouth every 6 (six) hours as needed for up to 10 days for severe pain.   . polyethylene glycol (MIRALAX / GLYCOLAX) 17 g packet Take 17 g by mouth daily as needed for moderate constipation (MIX AND DRINK).    Marland Kitchen sevelamer carbonate (RENVELA) 800 MG tablet Take 800 mg by mouth See admin instructions. Take 800 mg by mouth three times a day with food on Sun/Mon/Wed/Fri and two times a day on Tues/Thurs/Sat 02/17/2020: Regimen confirmed to be accurate by the patient's wife  . vitamin B-12 (CYANOCOBALAMIN) 1000 MCG tablet Take 1 tablet (1,000 mcg total) by mouth daily.    No facility-administered encounter medications on file as of 03/03/2020.     Objective:  Wt Readings from Last 3 Encounters:  02/29/20 161 lb 3.2 oz (73.1 kg)  02/26/20 157 lb (71.2 kg)  02/20/20 156 lb 8 oz (71 kg)   BP Readings from Last 3 Encounters:  03/02/20 (!) 181/82  02/29/20 124/65  02/26/20 (!) 141/70    Goals Addressed              This Visit's Progress     Patient Stated   .  COMPLETED: "I told the social worker I want to apply for Medicaid so I can get help in the home." (pt-stated)        [place] (see longitudinal plan of care for additional care plan information)   Current Barriers:  . Chronic Disease Management support, education, and care  coordination needs related to CHF, CAD, HTN, HLD, DMII, and CKD Stage 3- met with patient and significant other and patient's caregiver Dixon Boos. She showed this CCM RN the letter she received from [facility] indicating that patient qualified for [facility] medical assistance only. She says she doesn't understand what the letter means. 8/26- spoke with [name], she says patient is doing well (he went to the Emergency Department  on 8/24 for c/o weakness and dizziness probably secondary to low BP from dialysis)  but was not able to receive dialysis today because his access is not functioning, patient has an appointment to see vascular surgeon tomorrow  Case Manager  Clinical Goal(s):  Marland Kitchen Over the next 90 days, patient will work with BSW to address needs related to  applying for Medicaid  in patient with CHF, CAD, HTN, HLD, DMII, and CKD Stage 3  Interventions:  . Collaborated with BSW to initiate plan of care to address needs related to  applying for Medicaid  in patient with CHF, CAD, HTN, HLD, DMII, and CKD Stage 3 - Explained that per the letter the patient received from [facility], he will receive assistance with payment of his Part B Medicare premium with retroactive payment from April 2021, explained this type of Medicaid will not cover PCS . Referred [name] to the Medicaid case worker's name and phone number for further questions . 03/03/20 Mailed explanation of Medicare Part B benefits and cost of premiums for 2021- which  Medicaid will now pay retroactive to April 2021- to patient's caregiver and significant other Dixon Boos  Patient Self Care Activities:  . Patient verbalizes understanding of plan to work with BSW in applying for Medicaid  Please see past updates related to this goal by clicking on the "Past Updates" button in the selected goal          Plan:   The care management team will reach out to the patient again over the next 30-60 days.    Kelli Churn RN, CCM, [facility] RN Care Manager [phone]

## 2020-03-03 NOTE — Progress Notes (Signed)
Internal Medicine Clinic Attending  CCM services provided by the care management provider and their documentation were discussed with Dr. Basaraba. We reviewed the pertinent findings, urgent action items addressed by the resident and non-urgent items to be addressed by the PCP.  I agree with the assessment, diagnosis, and plan of care documented in the CCM and resident's note.  Advik Weatherspoon Thomas Ruther Ephraim, MD 03/03/2020  

## 2020-03-04 ENCOUNTER — Telehealth: Payer: Medicare PPO

## 2020-03-07 ENCOUNTER — Ambulatory Visit: Payer: Medicare PPO | Admitting: Surgery

## 2020-03-07 ENCOUNTER — Encounter (HOSPITAL_COMMUNITY): Payer: Medicare PPO

## 2020-03-07 ENCOUNTER — Telehealth: Payer: Self-pay | Admitting: Surgery

## 2020-03-10 NOTE — Progress Notes (Signed)
Cardiology Office Note   Date:  03/11/2020   ID:  Dennis Macias, DOB Mar 06, 1951, MRN [id number]  PCP:  [name], MD    No chief complaint on file.  Syncope  Wt Readings from Last 3 Encounters:  03/11/20 157 lb 6.4 oz (71.4 kg)  02/29/20 161 lb 3.2 oz (73.1 kg)  02/26/20 157 lb (71.2 kg)       History of Present Illness: Dennis Macias is a 69 y.o. male with prior CVA, HTN, DM, CRI who I saw for syncope in 09-05-2016.   09/06/15 echo showed normal LV function, normal valvular function.  Records show: "In 8/18, he was walking and felt lightheaded, he did not sit down.  He eventually passed out for a short time.  He hit his head.  He went to the ER and was thought to be dehydrated.  His diuretic was stopped, presumably as the wife reports that a med was stopped.   The second episode was 2 weeks ago, while in line at [place].  The nurse behind him caught him.    Diagnosed with HTN 2 years ago at the time of CVA. " Sister has had an AICD placed of note.  She passed away in September 06, 2015.  He had a brachiocephalic AV fistula placed in July 2021 by Dr. Donzetta Matters.  Records show: "He also recently underwent Abdominal aortogram with bilateral runoff and atherectomy and angioplasty of his left posterior tibial artery due to left great toe ulceration. This was performed by Dr. Trula Slade on 02/09/20. He had presented to the office on 02/08/20 due to new ulceration that was worsening despite course of antibiotics. On angiogram he was found to have severe tibial disease with occlusion of all tibial vessels. The posterior tibial artery was the only one that reconstituted at the ankle. This was noted to be very disease into the foot."  Due to persistent foot pain, further vascular evaluation is ongoing and there is consideration for amputation.  He has started dialysis.  Foot pain continues.  He needs vascular surgery appt rescheduled and he is waiting on this.   Hospitalized in May 2021: "NSTEMI--type I vs type  II. Troponins on admission 1618>2400. Unclear if this caused the decompensated heart failure vs type II NSTEMI 2/2 infection, volume overload, etc.  Decompensated heart failure. BNP 4000 on admission.  Non obstructive CAD. Underwent LHC 5/13 which revealed non-obstructive LAD disease. "   Event monitor in July 2021: "Normal sinus rhythm with rare PACs and PVCs.  No atrial fibrillation. No pathologic arrhythmias."  Denies : Chest pain. Dizziness.  Nitroglycerin use. Orthopnea. Palpitations. Paroxysmal nocturnal dyspnea. Shortness of breath. Syncope.   Walking limited by foot pain.  Sleeps on 1 pillow. Recent echo showed normal LVEF.  Past Medical History:  Diagnosis Date  . Anemia   . Arthritis    past hx   . Blindness    right eye  . Cardiorenal syndrome   . Cataract    removed both eyes  . Dehydration   . Diabetes ([place])   . Glaucoma   . History of CVA (cerebrovascular accident) 09/13/2015  . History of stroke 09/13/2015  . History of urinary retention   . Hyperlipidemia   . Hypertension   . NSTEMI (non-ST elevated myocardial infarction) ([place])   . Pernicious anemia 02/24/2018  . S/P TURP   . Stroke [facility])    2015/09/06- March  . Syncope 11/2019  . Tachycardia 08/26/2017  . Tubular adenoma of colon 02/2017  .  Weight loss, non-intentional 08/26/2017   10 lbs between 6/18 & 2/19    Past Surgical History:  Procedure Laterality Date  . ABDOMINAL AORTOGRAM W/LOWER EXTREMITY N/A 02/09/2020   Procedure: ABDOMINAL AORTOGRAM W/LOWER EXTREMITY;  Surgeon: Serafina Mitchell, MD;  Location: [place] [facility];  Service: Cardiovascular;  Laterality: N/A;  . AV FISTULA PLACEMENT Right 01/19/2020   Procedure: BRACHIOCEPHALIC ARTERIOVENOUS (AV) FISTULA CREATION;  Surgeon: Waynetta Sandy, MD;  Location: [place];  Service: Vascular;  Laterality: Right;  . CATARACT EXTRACTION, BILATERAL    . COLONOSCOPY    . IR FLUORO GUIDE CV LINE RIGHT  01/14/2020  . IR THORACENTESIS ASP PLEURAL SPACE W/IMG  GUIDE  09/21/2019  . IR THORACENTESIS ASP PLEURAL SPACE W/IMG GUIDE  10/16/2019  . IR US GUIDE VASC ACCESS RIGHT  01/15/2020  . LEFT HEART CATH AND CORONARY ANGIOGRAPHY N/A 11/19/2019   Procedure: LEFT HEART CATH AND CORONARY ANGIOGRAPHY;  Surgeon: Jettie Booze, MD;  Location: [place] [facility];  Service: Cardiovascular;  Laterality: N/A;  . PERIPHERAL VASCULAR INTERVENTION Left 02/09/2020   Procedure: PERIPHERAL VASCULAR INTERVENTION;  Surgeon: Serafina Mitchell, MD;  Location: [place] [facility];  Service: Cardiovascular;  Laterality: Left;  . POLYPECTOMY    . REFRACTIVE SURGERY  10/2017  . TEE WITHOUT CARDIOVERSION N/A 09/14/2015   Procedure: TRANSESOPHAGEAL ECHOCARDIOGRAM (TEE);  Surgeon: Larey Dresser, MD;  Location: [place];  Service: Cardiovascular;  Laterality: N/A;  . TRANSURETHRAL RESECTION OF PROSTATE N/A 10/20/2019   Procedure: TRANSURETHRAL RESECTION OF THE PROSTATE (TURP);  Surgeon: Irine Seal, MD;  Location: [facility];  Service: Urology;  Laterality: N/A;  . UPPER GASTROINTESTINAL ENDOSCOPY       Current Outpatient Medications  Medication Sig Dispense Refill  . Accu-Chek FastClix Lancets MISC Check blood sugar up to 7 times a week as instructed 102 each 3  . acetaminophen (TYLENOL) 500 MG tablet Take 500 mg by mouth every 6 (six) hours as needed for moderate pain or headache.    Marland Kitchen aspirin EC 81 MG tablet Take 1 tablet (81 mg total) by mouth daily.    Marland Kitchen atorvastatin (LIPITOR) 80 MG tablet Take 1 tablet (80 mg total) by mouth at bedtime. IM program 90 tablet 3  . Blood Glucose Monitoring Suppl (ACCU-CHEK GUIDE) w/Device KIT 1 each by Does not apply route daily. Check blood sugar as instructed up to 7 times a week 1 kit 0  . clopidogrel (PLAVIX) 75 MG tablet Take 1 tablet (75 mg total) by mouth daily. 90 tablet 1  . dorzolamide-timolol (COSOPT) 22.3-6.8 MG/ML ophthalmic solution Place 1 drop into the right eye 2 (two) times daily. 10 mL 10  . finasteride (PROSCAR) 5 MG tablet  Take 1 tablet (5 mg total) by mouth daily. 90 tablet 1  . glucose blood (ACCU-CHEK GUIDE) test strip Check blood sugar up to 7 times a week as instructed 100 each 3  . polyethylene glycol (MIRALAX / GLYCOLAX) 17 g packet Take 17 g by mouth daily as needed for moderate constipation (MIX AND DRINK).     Marland Kitchen sevelamer carbonate (RENVELA) 800 MG tablet Take 800 mg by mouth See admin instructions. Take 800 mg by mouth three times a day with food on Sun/Mon/Wed/Fri and two times a day on Tues/Thurs/Sat    . vitamin B-12 (CYANOCOBALAMIN) 1000 MCG tablet Take 1 tablet (1,000 mcg total) by mouth daily. 30 tablet 0   No current facility-administered medications for this visit.    Allergies:  Cefepime    Social History:  The patient  reports that he has quit smoking. He quit smokeless tobacco use about 41 years ago.  His smokeless tobacco use included chew. He reports that he does not drink alcohol and does not use drugs.   Family History:  The patient's family history includes Hyperlipidemia in his father and mother; Hypertension in his mother.    ROS:  Please see the history of present illness.   Otherwise, review of systems are positive for foot pain.   All other systems are reviewed and negative.    PHYSICAL EXAM: VS:  BP (!) 114/58   Pulse 86   Ht 6' (1.829 m)   Wt 157 lb 6.4 oz (71.4 kg)   SpO2 91%   BMI 21.35 kg/m  , BMI Body mass index is 21.35 kg/m. GEN: Well nourished, well developed, in no acute distress  HEENT: normal  Neck: no JVD, carotid bruits, or masses Cardiac: RRR; no murmurs, rubs, or gallops,no edema  Respiratory:  clear to auscultation bilaterally, normal work of breathing GI: soft, nontender, nondistended, + BS MS: no deformity or atrophy  Skin: warm and dry, no rash Neuro:  Strength and sensation are intact Psych: euthymic mood, full affect   EKG:   The ekg ordered today demonstrates NSR, RBBB, LAFB   Recent Labs: 11/13/2019: TSH 4.534 01/13/2020: B Natriuretic  Peptide 705.7 02/18/2020: ALT 9; Magnesium 1.9 03/01/2020: BUN 14; Creatinine, Ser 2.23; Hemoglobin 8.4; Platelets 371; Potassium 3.8; Sodium 136   Lipid Panel    Component Value Date/Time   CHOL 128 02/18/2020 0152   CHOL 123 10/14/2019 1028   TRIG 27 02/18/2020 0152   HDL 62 02/18/2020 0152   HDL 65 10/14/2019 1028   CHOLHDL 2.1 02/18/2020 0152   VLDL 5 02/18/2020 0152   LDLCALC 61 02/18/2020 0152   LDLCALC 49 10/14/2019 1028     Other studies Reviewed: Additional studies/ records that were reviewed today with results demonstrating: 02/18/20: echo: Left ventricular ejection fraction, by estimation, is 60 to 65%. The  left ventricle has normal function. The left ventricle has no regional  wall motion abnormalities. Left ventricular diastolic parameters are  consistent with Grade II diastolic  dysfunction (pseudonormalization).  2. Right ventricular systolic function is normal. The right ventricular  size is normal.  3. Left atrial size was moderately dilated.  4. Right atrial size was mildly dilated.  5. The mitral valve is normal in structure. No evidence of mitral valve  regurgitation.  6. The aortic valve is normal in structure. Aortic valve regurgitation is  not visualized. .   ASSESSMENT AND PLAN:  1.   PAD: He may need amputation of his foot.  Continue atorvastatin. 2.   HTN: No meds needed since dialysis.  No CHF.  Low salt diet.  Whole food plant based diet.  Normal EF.  No further CAD w/u needed.  Likely demand ischemia in the past. 3. DM: A1C 6.4.  Controlled.  Healthy diet. 4. ESRD: Tolerating dialysis.   5. Hyperlipidemia: atorvastatin 80; LDL 61 in 2021   Current medicines are reviewed at length with the patient today.  The patient concerns regarding his medicines were addressed.  The following changes have been made:  No change  Labs/ tests ordered today include:  No orders of the defined types were placed in this encounter.   Recommend 150  minutes/week of aerobic exercise Low fat, low carb, high fiber diet recommended  Disposition:   FU in 1  year   Signed, Larae Grooms, MD  03/11/2020 2:05 PM    [facility] [address], [place], [place]  [address] Phone: [phone]; Fax: [phone]

## 2020-03-11 ENCOUNTER — Other Ambulatory Visit: Payer: Self-pay

## 2020-03-11 ENCOUNTER — Ambulatory Visit (INDEPENDENT_AMBULATORY_CARE_PROVIDER_SITE_OTHER): Payer: Medicare PPO | Admitting: Interventional Cardiology

## 2020-03-11 ENCOUNTER — Encounter: Payer: Self-pay | Admitting: Interventional Cardiology

## 2020-03-11 ENCOUNTER — Telehealth: Payer: Self-pay

## 2020-03-11 VITALS — BP 114/58 | HR 86 | Ht 72.0 in | Wt 157.4 lb

## 2020-03-11 DIAGNOSIS — I739 Peripheral vascular disease, unspecified: Secondary | ICD-10-CM

## 2020-03-11 DIAGNOSIS — I998 Other disorder of circulatory system: Secondary | ICD-10-CM | POA: Diagnosis not present

## 2020-03-11 DIAGNOSIS — E782 Mixed hyperlipidemia: Secondary | ICD-10-CM

## 2020-03-11 DIAGNOSIS — I10 Essential (primary) hypertension: Secondary | ICD-10-CM | POA: Diagnosis not present

## 2020-03-11 DIAGNOSIS — N186 End stage renal disease: Secondary | ICD-10-CM | POA: Diagnosis not present

## 2020-03-11 NOTE — Patient Instructions (Signed)
Medication Instructions:  Your physician recommends that you continue on your current medications as directed. Please refer to the Current Medication list given to you today.  *If you need a refill on your cardiac medications before your next appointment, please call your pharmacy*   Lab Work: None If you have labs (blood work) drawn today and your tests are completely normal, you will receive your results only by: . MyChart Message (if you have MyChart) OR . A paper copy in the mail If you have any lab test that is abnormal or we need to change your treatment, we will call you to review the results.   Testing/Procedures: None   Follow-Up: At [facility], you and your health needs are our priority.  As part of our continuing mission to provide you with exceptional heart care, we have created designated Provider Care Teams.  These Care Teams include your primary Cardiologist (physician) and Advanced Practice Providers (APPs -  Physician Assistants and Nurse Practitioners) who all work together to provide you with the care you need, when you need it.  We recommend signing up for the patient portal called "MyChart".  Sign up information is provided on this After Visit Summary.  MyChart is used to connect with patients for Virtual Visits (Telemedicine).  Patients are able to view lab/test results, encounter notes, upcoming appointments, etc.  Non-urgent messages can be sent to your provider as well.   To learn more about what you can do with MyChart, go to [email or web address].    Your next appointment:   12 month(s)  The format for your next appointment:   In Person  Provider:   You may see Jayadeep Varanasi, MD or one of the following Advanced Practice Providers on your designated Care Team:    Dayna Dunn, PA-C  Michele Lenze, PA-C    Other Instructions None  

## 2020-03-11 NOTE — Telephone Encounter (Signed)
Returned call to [facility] @ [facility] - called to report pt has black eschar to left great toe and fifth toe and c/o pain, not sure of intensity just know he c/o pain mostly in L great toe. Also states that pt had an appt but was canceled and the pt is not sure why. Denies drainage and any signs of infection. Advised that an appointment would be scheduled for pt to be seen. Nurse verbalized understanding and agreed with this plan.

## 2020-03-18 ENCOUNTER — Telehealth: Payer: Self-pay

## 2020-03-18 ENCOUNTER — Telehealth: Payer: Self-pay | Admitting: Internal Medicine

## 2020-03-18 NOTE — Telephone Encounter (Signed)
Pls contact Clair Gulling [phone] w/ VO

## 2020-03-18 NOTE — Telephone Encounter (Signed)
Call from Dennis Macias with [facility] PT regarding pt's L great toe and fifth toe wounds looking worse from when he saw him 2 weeks ago. Dennis Macias spoke to [facility] nurse who states this is unchanged from when she saw him this week on Wed. Pt is scheduled for office visit on Monday. I attempted to reach pt on both his lines and was unable to speak to him.

## 2020-03-18 NOTE — Telephone Encounter (Signed)
Dennis Derry, NP called to discuss worsening of L foot since she saw it a month ago. She stated pt denies pain; afebrile; no drainage. I spoke to pt's wife to reinforce what Dennis Macias also discussed-pt is to report to ED if he experiences severe pain, color changes to foot, or fever. She verbalized understanding.

## 2020-03-18 NOTE — Telephone Encounter (Signed)
Returned call to Herbert Deaner, PT with [name]. No answer. Left message on VM requesting return call. Hubbard Hartshorn, RN, BSN

## 2020-03-21 ENCOUNTER — Encounter: Payer: Self-pay | Admitting: Adult Health

## 2020-03-21 ENCOUNTER — Other Ambulatory Visit: Payer: Self-pay

## 2020-03-21 ENCOUNTER — Ambulatory Visit (INDEPENDENT_AMBULATORY_CARE_PROVIDER_SITE_OTHER): Payer: Medicare PPO | Admitting: Adult Health

## 2020-03-21 ENCOUNTER — Ambulatory Visit (INDEPENDENT_AMBULATORY_CARE_PROVIDER_SITE_OTHER): Payer: Medicare PPO | Admitting: Surgery

## 2020-03-21 ENCOUNTER — Encounter: Payer: Self-pay | Admitting: Surgery

## 2020-03-21 VITALS — BP 141/71 | HR 80 | Ht 72.0 in

## 2020-03-21 VITALS — BP 138/72 | HR 80 | Temp 98.8°F | Resp 20 | Ht 72.0 in | Wt 157.0 lb

## 2020-03-21 DIAGNOSIS — E785 Hyperlipidemia, unspecified: Secondary | ICD-10-CM | POA: Diagnosis not present

## 2020-03-21 DIAGNOSIS — I10 Essential (primary) hypertension: Secondary | ICD-10-CM | POA: Diagnosis not present

## 2020-03-21 DIAGNOSIS — I7025 Atherosclerosis of native arteries of other extremities with ulceration: Secondary | ICD-10-CM

## 2020-03-21 DIAGNOSIS — I951 Orthostatic hypotension: Secondary | ICD-10-CM | POA: Diagnosis not present

## 2020-03-21 DIAGNOSIS — I639 Cerebral infarction, unspecified: Secondary | ICD-10-CM | POA: Diagnosis not present

## 2020-03-21 DIAGNOSIS — I739 Peripheral vascular disease, unspecified: Secondary | ICD-10-CM

## 2020-03-21 NOTE — H&P (View-Only) (Signed)
[facility] [place]  Patient name: Dennis Macias MRN: [id number] DOB: [birth year] Sex: male   REASON FOR VISIT:    Follow up  HISOTRY OF PRESENT ILLNESS:    Dennis Macias is a 69 y.o. male who presented with a left toe ulcer.  On 02-09-2020, he under went angiography and had atherectomy and PTA of his PT. despite revascularization, his foot has continued to deteriorate.  He now has new wounds all over the foot and has significant pain.  The patient has a history of stroke but has no residual deficits.  He is now end-stage renal disease on dialysis Tuesday Thursday Saturday.  Dr. Donzetta Matters placed a right brachiocephalic fistula in early July which is yet to mature.  He suffers from diabetes which is diet controlled.  He takes a statin for hypercholesterolemia.  He has a history of congestive heart failure. PAST MEDICAL HISTORY:   Past Medical History:  Diagnosis Date  . Anemia   . Arthritis    past hx   . Blindness    right eye  . Cardiorenal syndrome   . Cataract    removed both eyes  . Dehydration   . Diabetes ([place])   . Glaucoma   . History of CVA (cerebrovascular accident) 09/13/2015  . History of stroke 09/13/2015  . History of urinary retention   . Hyperlipidemia   . Hypertension   . NSTEMI (non-ST elevated myocardial infarction) ([place])   . Pernicious anemia 02/24/2018  . S/P TURP   . Stroke [facility])    2017- March  . Syncope 11/2019  . Tachycardia 08/26/2017  . Tubular adenoma of colon 02/2017  . Weight loss, non-intentional 08/26/2017   10 lbs between 6/18 & 2/19     FAMILY HISTORY:   Family History  Problem Relation Age of Onset  . Hypertension Mother   . Hyperlipidemia Mother   . Hyperlipidemia Father   . Colon cancer Neg Hx   . Colon polyps Neg Hx   . Esophageal cancer Neg Hx   . Rectal cancer Neg Hx   . Stomach cancer Neg Hx     SOCIAL HISTORY:   Social History   Tobacco Use  . Smoking status: Former  Research scientist (life sciences)  . Smokeless tobacco: Former Systems developer    Types: Chew    Quit date: 07/09/1978  . Tobacco comment: quit 1 year ago  Substance Use Topics  . Alcohol use: No    Alcohol/week: 1.0 standard drink    Types: 1 Cans of beer per week    Comment: quit last march/2017     ALLERGIES:   Allergies  Allergen Reactions  . Cefepime Other (See Comments)    Pt had BAD encephalopathy from Cefepime     CURRENT MEDICATIONS:   Current Outpatient Medications  Medication Sig Dispense Refill  . Accu-Chek FastClix Lancets MISC Check blood sugar up to 7 times a week as instructed 102 each 3  . acetaminophen (TYLENOL) 500 MG tablet Take 500 mg by mouth every 6 (six) hours as needed for moderate pain or headache.    Marland Kitchen aspirin EC 81 MG tablet Take 1 tablet (81 mg total) by mouth daily.    Marland Kitchen atorvastatin (LIPITOR) 80 MG tablet Take 1 tablet (80 mg total) by mouth at bedtime. IM program 90 tablet 3  . Blood Glucose Monitoring Suppl (ACCU-CHEK GUIDE) w/Device KIT 1 each by Does not apply route daily. Check blood sugar as instructed up to 7 times a week 1 kit  0  . clopidogrel (PLAVIX) 75 MG tablet Take 1 tablet (75 mg total) by mouth daily. 90 tablet 1  . dorzolamide-timolol (COSOPT) 22.3-6.8 MG/ML ophthalmic solution Place 1 drop into the right eye 2 (two) times daily. 10 mL 10  . finasteride (PROSCAR) 5 MG tablet Take 1 tablet (5 mg total) by mouth daily. 90 tablet 1  . glucose blood (ACCU-CHEK GUIDE) test strip Check blood sugar up to 7 times a week as instructed 100 each 3  . polyethylene glycol (MIRALAX / GLYCOLAX) 17 g packet Take 17 g by mouth daily as needed for moderate constipation (MIX AND DRINK).     Marland Kitchen sevelamer carbonate (RENVELA) 800 MG tablet Take 800 mg by mouth See admin instructions. Take 800 mg by mouth three times a day with food on Sun/Mon/Wed/Fri and two times a day on Tues/Thurs/Sat    . vitamin B-12 (CYANOCOBALAMIN) 1000 MCG tablet Take 1 tablet (1,000 mcg total) by mouth daily. 30 tablet  0   No current facility-administered medications for this visit.    REVIEW OF SYSTEMS:   [X]  denotes positive finding, [ ]  denotes negative finding Cardiac  Comments:  Chest pain or chest pressure:    Shortness of breath upon exertion:    Short of breath when lying flat:    Irregular heart rhythm:        Vascular    Pain in calf, thigh, or hip brought on by ambulation: x   Pain in feet at night that wakes you up from your sleep:  x   Blood clot in your veins:    Leg swelling:         Pulmonary    Oxygen at home:    Productive cough:     Wheezing:         Neurologic    Sudden weakness in arms or legs:     Sudden numbness in arms or legs:     Sudden onset of difficulty speaking or slurred speech:    Temporary loss of vision in one eye:     Problems with dizziness:         Gastrointestinal    Blood in stool:     Vomited blood:         Genitourinary    Burning when urinating:     Blood in urine:        Psychiatric    Major depression:         Hematologic    Bleeding problems:    Problems with blood clotting too easily:        Skin x   Rashes or ulcers:        Constitutional    Fever or chills:      PHYSICAL EXAM:   Vitals:   03/21/20 1109  BP: 138/72  Pulse: 80  Resp: 20  Temp: 98.8 F (37.1 C)  SpO2: 95%  Weight: 157 lb (71.2 kg)  Height: 6' (1.829 m)    GENERAL: The patient is a well-nourished male, in no acute distress. The vital signs are documented above. CARDIAC: There is a regular rate and rhythm.  VASCULAR: Extensive ulcerations of the left foot including the great toe the dorsum of the foot and the lateral aspect of the foot PULMONARY: Non-labored respirations ABDOMEN: Soft and non-tender with normal pitched bowel sounds.  MUSCULOSKELETAL: There are no major deformities or cyanosis. NEUROLOGIC: No focal weakness or paresthesias are detected. SKIN: There are no ulcers or rashes noted. PSYCHIATRIC: The  patient has a normal affect.   STUDIES:   None  MEDICAL ISSUES:   PAD with gangrene: Despite revascularization, the patient's foot has deteriorated.  This is clearly not a salvageable situation.  I discussed this with the patient.  I think he needs to proceed with a below-knee amputation.  We discussed the possibility of nonhealing and conversion to a more proximal amputation.  I think this needs to be done within the next week or so.  He is going to try to make arrangements at home and then contact me to schedule his operation which will be a below-knee amputation on the left    [name], IV, MD, FACS [facility] [phone] Pager [phone]

## 2020-03-21 NOTE — Progress Notes (Signed)
[facility] of [place]  Patient name: Dennis Macias MRN: [id number] DOB: November 01, 1950 Sex: male   REASON FOR VISIT:    Follow up  HISOTRY OF PRESENT ILLNESS:    Dennis Macias is a 69 y.o. male who presented with a left toe ulcer.  On 02-09-2020, he under went angiography and had atherectomy and PTA of his PT. despite revascularization, his foot has continued to deteriorate.  He now has new wounds all over the foot and has significant pain.  The patient has a history of stroke but has no residual deficits.  He is now end-stage renal disease on dialysis Tuesday Thursday Saturday.  Dr. Donzetta Matters placed a right brachiocephalic fistula in early July which is yet to mature.  He suffers from diabetes which is diet controlled.  He takes a statin for hypercholesterolemia.  He has a history of congestive heart failure. PAST MEDICAL HISTORY:   Past Medical History:  Diagnosis Date  . Anemia   . Arthritis    past hx   . Blindness    right eye  . Cardiorenal syndrome   . Cataract    removed both eyes  . Dehydration   . Diabetes ([place])   . Glaucoma   . History of CVA (cerebrovascular accident) 09/13/2015  . History of stroke 09/13/2015  . History of urinary retention   . Hyperlipidemia   . Hypertension   . NSTEMI (non-ST elevated myocardial infarction) ([place])   . Pernicious anemia 02/24/2018  . S/P TURP   . Stroke [facility])    2017- March  . Syncope 11/2019  . Tachycardia 08/26/2017  . Tubular adenoma of colon 02/2017  . Weight loss, non-intentional 08/26/2017   10 lbs between 6/18 & 2/19     FAMILY HISTORY:   Family History  Problem Relation Age of Onset  . Hypertension Mother   . Hyperlipidemia Mother   . Hyperlipidemia Father   . Colon cancer Neg Hx   . Colon polyps Neg Hx   . Esophageal cancer Neg Hx   . Rectal cancer Neg Hx   . Stomach cancer Neg Hx     SOCIAL HISTORY:   Social History   Tobacco Use  . Smoking status: Former  Research scientist (life sciences)  . Smokeless tobacco: Former Systems developer    Types: Chew    Quit date: 07/09/1978  . Tobacco comment: quit 1 year ago  Substance Use Topics  . Alcohol use: No    Alcohol/week: 1.0 standard drink    Types: 1 Cans of beer per week    Comment: quit last march/2017     ALLERGIES:   Allergies  Allergen Reactions  . Cefepime Other (See Comments)    Pt had BAD encephalopathy from Cefepime     CURRENT MEDICATIONS:   Current Outpatient Medications  Medication Sig Dispense Refill  . Accu-Chek FastClix Lancets MISC Check blood sugar up to 7 times a week as instructed 102 each 3  . acetaminophen (TYLENOL) 500 MG tablet Take 500 mg by mouth every 6 (six) hours as needed for moderate pain or headache.    Marland Kitchen aspirin EC 81 MG tablet Take 1 tablet (81 mg total) by mouth daily.    Marland Kitchen atorvastatin (LIPITOR) 80 MG tablet Take 1 tablet (80 mg total) by mouth at bedtime. IM program 90 tablet 3  . Blood Glucose Monitoring Suppl (ACCU-CHEK GUIDE) w/Device KIT 1 each by Does not apply route daily. Check blood sugar as instructed up to 7 times a week 1 kit  0  . clopidogrel (PLAVIX) 75 MG tablet Take 1 tablet (75 mg total) by mouth daily. 90 tablet 1  . dorzolamide-timolol (COSOPT) 22.3-6.8 MG/ML ophthalmic solution Place 1 drop into the right eye 2 (two) times daily. 10 mL 10  . finasteride (PROSCAR) 5 MG tablet Take 1 tablet (5 mg total) by mouth daily. 90 tablet 1  . glucose blood (ACCU-CHEK GUIDE) test strip Check blood sugar up to 7 times a week as instructed 100 each 3  . polyethylene glycol (MIRALAX / GLYCOLAX) 17 g packet Take 17 g by mouth daily as needed for moderate constipation (MIX AND DRINK).     Marland Kitchen sevelamer carbonate (RENVELA) 800 MG tablet Take 800 mg by mouth See admin instructions. Take 800 mg by mouth three times a day with food on Sun/Mon/Wed/Fri and two times a day on Tues/Thurs/Sat    . vitamin B-12 (CYANOCOBALAMIN) 1000 MCG tablet Take 1 tablet (1,000 mcg total) by mouth daily. 30 tablet  0   No current facility-administered medications for this visit.    REVIEW OF SYSTEMS:   [X]  denotes positive finding, [ ]  denotes negative finding Cardiac  Comments:  Chest pain or chest pressure:    Shortness of breath upon exertion:    Short of breath when lying flat:    Irregular heart rhythm:        Vascular    Pain in calf, thigh, or hip brought on by ambulation: x   Pain in feet at night that wakes you up from your sleep:  x   Blood clot in your veins:    Leg swelling:         Pulmonary    Oxygen at home:    Productive cough:     Wheezing:         Neurologic    Sudden weakness in arms or legs:     Sudden numbness in arms or legs:     Sudden onset of difficulty speaking or slurred speech:    Temporary loss of vision in one eye:     Problems with dizziness:         Gastrointestinal    Blood in stool:     Vomited blood:         Genitourinary    Burning when urinating:     Blood in urine:        Psychiatric    Major depression:         Hematologic    Bleeding problems:    Problems with blood clotting too easily:        Skin x   Rashes or ulcers:        Constitutional    Fever or chills:      PHYSICAL EXAM:   Vitals:   03/21/20 1109  BP: 138/72  Pulse: 80  Resp: 20  Temp: 98.8 F (37.1 C)  SpO2: 95%  Weight: 157 lb (71.2 kg)  Height: 6' (1.829 m)    GENERAL: The patient is a well-nourished male, in no acute distress. The vital signs are documented above. CARDIAC: There is a regular rate and rhythm.  VASCULAR: Extensive ulcerations of the left foot including the great toe the dorsum of the foot and the lateral aspect of the foot PULMONARY: Non-labored respirations ABDOMEN: Soft and non-tender with normal pitched bowel sounds.  MUSCULOSKELETAL: There are no major deformities or cyanosis. NEUROLOGIC: No focal weakness or paresthesias are detected. SKIN: There are no ulcers or rashes noted. PSYCHIATRIC: The  patient has a normal  affect.  STUDIES:   None  MEDICAL ISSUES:   PAD with gangrene: Despite revascularization, the patient's foot has deteriorated.  This is clearly not a salvageable situation.  I discussed this with the patient.  I think he needs to proceed with a below-knee amputation.  We discussed the possibility of nonhealing and conversion to a more proximal amputation.  I think this needs to be done within the next week or so.  He is going to try to make arrangements at home and then contact me to schedule his operation which will be a below-knee amputation on the left    [name], IV, MD, FACS [facility] [phone] Pager [phone]

## 2020-03-21 NOTE — Patient Instructions (Signed)
Continue aspirin 81 mg daily and clopidogrel 75 mg daily  and atorvastatin 80 mg daily for secondary stroke prevention  As discussed during visit, use of both aspirin and Plavix is not indicated from a stroke standpoint but may be indicated from a cardiology or vascular standpoint therefore continue both aspirin and Plavix unless otherwise advised by vascular or cardiology  Continue to follow with cardiology and vascular surgery as advised  Continue to follow up with PCP regarding cholesterol, blood pressure and pre-diabetes management  Maintain strict control of hypertension with blood pressure goal below 130/90, diabetes with hemoglobin A1c goal below 7% and cholesterol with LDL cholesterol (bad cholesterol) goal below 70 mg/dL.       Followup in the future with me in 4 months or call earlier if needed       Thank you for coming to see [place] at [facility]. I hope we have been able to provide you high quality care today.  You may receive a patient satisfaction survey over the next few weeks. We would appreciate your feedback and comments so that we may continue to improve ourselves and the health of our patients.

## 2020-03-21 NOTE — Progress Notes (Signed)
[facility] [address] [place]. [place] [address] [phone]       OFFICE  FOLLOW UP VISIT NOTE  [name] Date of Birth:  07-21-1950 Medical Record Number:  [id number]   Referring MD: Dennis Macias Reason for Referral: stroke follow-up  Chief Complaint  Patient presents with  . Follow-up    rm 5. Pt is here with his partner Dennis Macias. She said he has no new sx.       HPI:   [name] returns today, 03/21/2020, for stroke follow-up.  Previously seen by Dr. Leonie Macias on 12/21/2019 and since that time has had an additional admission on 02/07/2020 for white matter bilateral strokes likely secondary to hypotension, start of HD for ESRD and worsening PAD now requiring amputation.    Presented to ED on 02/17/2020 after syncopal episode while using the bathroom.  MRI revealed acute white matter bilateral strokes in a watershed distribution.  Given watershed distribution and syncopal episode, strokes are likely due to hypotension post HD or orthostatic hypotension.  Antihypertensive regimen adjusted by nephrology due to recent start of HD.  MRA head negative for LVO.  Carotid ultrasound showed bilateral ICA 1 to 39% stenosis.  2D echo showed an EF of 60 to 65%.  LDL 61.  A1c 6.1.  Recommended continuation of DAPT and atorvastatin and discharged home with recommendation of home health therapy.  No residual stroke deficits and currently at baseline.  He has not had any reoccurrence of hypotension since adjustment of antihypertensives.  Blood pressure routinely monitor at HD.  Blood pressure today 141/71.  He remains on aspirin and Plavix without bleeding or bruising.  Remains on atorvastatin 80 mg daily without myalgias.   In 01/2020, underwent right brachiocephalic fistula by Dr. Donzetta Matters.  Currently receiving HD trice weekly and follows closely with nephrology On 02/09/2020, underwent abdominal aortogram with bilateral runoff and atherectomy and angioplasty of left posterior tibial  artery due to left great toe ulceration found to have severe tibial disease with occlusion of all tibial vessels.  Had follow-up with Dr. Trula Slade vascular surgery today with noted continued deterioration of left foot and plans on proceeding with below-knee amputation currently scheduled on 9/22    No further concerns at this time      History provided for reference purposes only Update 12/21/2019 Dr. Leonie Macias : Patient is seen for follow-up after last consultation visit 2 months ago.  He was hospitalized on 11/06/2019 when he was eating breakfast and his girlfriend called paramedics he had a episode of feeling dizzy lightheaded and nauseous and nearly passing out.  He had extensive work-up in the hospital for this including arrhythmia, coronary artery disease, seizure, syncope and recent medication increase of hydralazine.  The patient underwent cardiac cath on 11/19/2019 which showed mild LAD lesion and ostial LAD lesion but he did not have any chest pain hence intervention was not performed.  Patient became confused on 11/11/2019 and CT scan of the head showed no acute abnormalities.  Subsequently a code stroke was called and MRI scan was obtained which showed a tiny cortical infarct in left parietal region which was felt to be incidental and could not explain his altered mental status which was thought to be likely due to encephalopathy possibly from cefepime antibiotic he got for Pseudomonas UTI.  EEG showed moderate diffuse slowing consistent with encephalopathy.  His mental status gradually improved.  He was in mild renal failure which also improved.  He was subsequently transferred to inpatient rehab  and has made progressive improvement.  Currently is living at home.  He is able to walk and is getting home physical and occupational therapy.  His mentation has improved significantly and is back to his baseline.  He does use a cane mostly when walking but uses walker for long distances.  Outpatient 30-day  heart monitor was recommended but so far has not been done.  2D echo showed EF of 40 to 45% LA was severely dilated.  LDL cholesterol 56 mg percent.  Hemoglobin A1c was 5.9.  Patient was recommended to be on Plavix alone due to history of recent hematuria but review of medication shows that he is on both.  He is complaining of some bruising but no more hematuria or bleeding.  Initial Consult 10/14/2019 Dr. Leonie Macias: [name] is  69 year old African-American gentleman who is seen today for office consultation visit for leg weakness.  He is accompanied by his son-in-law as well as his daughter is present for this visit over the phone.  History is obtained from them, review of referral notes and I personally reviewed imaging films in PACS. Patient has been having increasing gait difficulties for the last 6 months.  He has remote history of bilateral lacunar infarcts in 2017 at that time he had seen me.  Had mild residual left-sided weakness and gait difficulties but was able to walk independently.  For the last 6 months he has had progressive difficulty walking and feels that his legs are weak and give out.  He occasionally has numbness in his feet but this is not bothersome.  He feels his right leg seems to be weaker now than the left one.  He denies significant back pain, radicular pain.  He has had problems with his prostate in the last several months and had urinary retention and chronic indwelling catheter.  He in fact is planning on having prostate surgery in the next few weeks.  Last month he was also diagnosed with bilateral pleural effusion and ascites which is required thoracocentesis but fluid analysis showed it to be a transudate and likely related to his hypoalbuminemia and anemia and renal failure.  Review of lab work show that on 10/13/2019 hemoglobin A1c was 5.2.  Hematocrit was 29.2 and albumin was low at 2.2.  Patient did have CT scan of the head on 08/05/2019 when he had a fall and sustained a left  frontal contusion.  There is no acute brain abnormalities noted.  CT scan of cervical spine at that time showed spondylitic changes at C3/4.  On inquiry the patient and daughter state that he did have an episode in through the end of January this year when he had some speech difficulties with garbled speech and some confusion he was seen in the emergency room with symptoms resolved and apparently brain scan was unremarkable and he was discharged home.  Patient was on aspirin until he started having some bleeding from his bladder and it was discontinued.  He has been tolerating Lipitor well without muscle aches and pains but is unclear as to when his last lipid profile was checked.  He denies any decreased appetite, weight loss or low-grade fevers. Prior stroke 09/12/2015-MRI scan showed 2 small acute lacunar infarcts in the right mid to posterior frontal lobe and a small acute infarct in the left anterior medial left frontal lobe. MRA of the brain showed no significant large vessel stenosis left terminal vertebral artery was not and irregular. LDL cholesterol is elevated at 148 and  hemoglobin A1c at 10.7. Transesophageal echocardiogram showed normal ejection fraction of 60-65% and no cardiac source of embolism or PFO. Carotid Doppler showed no significant extracranial stenosis. Patient was started on aspirin for stroke prevention and Lipitor.     ROS:   14 system review of systems is positive for those listed in HPI and all other systems negative  PMH:  Past Medical History:  Diagnosis Date  . Anemia   . Arthritis    past hx   . Blindness    right eye  . Cardiorenal syndrome   . Cataract    removed both eyes  . Dehydration   . Diabetes ([place])   . Glaucoma   . History of CVA (cerebrovascular accident) 09/13/2015  . History of stroke 09/13/2015  . History of urinary retention   . Hyperlipidemia   . Hypertension   . NSTEMI (non-ST elevated myocardial infarction) ([place])   . Pernicious anemia  02/24/2018  . S/P TURP   . Stroke [facility])    2017- March  . Syncope 11/2019  . Tachycardia 08/26/2017  . Tubular adenoma of colon 02/2017  . Weight loss, non-intentional 08/26/2017   10 lbs between 6/18 & 2/19    Social History:  Social History   Socioeconomic History  . Marital status: Married    Spouse name: Not on file  . Number of children: Not on file  . Years of education: Not on file  . Highest education level: Not on file  Occupational History  . Not on file  Tobacco Use  . Smoking status: Former Research scientist (life sciences)  . Smokeless tobacco: Former Systems developer    Types: Chew    Quit date: 07/09/1978  . Tobacco comment: quit 1 year ago  Vaping Use  . Vaping Use: Never used  Substance and Sexual Activity  . Alcohol use: No    Alcohol/week: 1.0 standard drink    Types: 1 Cans of beer per week    Comment: quit last march/2017  . Drug use: No  . Sexual activity: Not on file  Other Topics Concern  . Not on file  Social History Narrative  . Not on file   Social Determinants of Health   Financial Resource Strain:   . Difficulty of Paying Living Expenses: Not on file  Food Insecurity:   . Worried About Charity fundraiser in the Last Year: Not on file  . Ran Out of Food in the Last Year: Not on file  Transportation Needs:   . Lack of Transportation (Medical): Not on file  . Lack of Transportation (Non-Medical): Not on file  Physical Activity:   . Days of Exercise per Week: Not on file  . Minutes of Exercise per Session: Not on file  Stress:   . Feeling of Stress : Not on file  Social Connections:   . Frequency of Communication with Friends and Family: Not on file  . Frequency of Social Gatherings with Friends and Family: Not on file  . Attends Religious Services: Not on file  . Active Member of Clubs or Organizations: Not on file  . Attends Archivist Meetings: Not on file  . Marital Status: Not on file  Intimate Partner Violence:   . Fear of Current or Ex-Partner: Not on  file  . Emotionally Abused: Not on file  . Physically Abused: Not on file  . Sexually Abused: Not on file    Medications:   Current Outpatient Medications on File Prior to Visit  Medication Sig  Dispense Refill  . Accu-Chek FastClix Lancets MISC Check blood sugar up to 7 times a week as instructed 102 each 3  . acetaminophen (TYLENOL) 500 MG tablet Take 500 mg by mouth every 6 (six) hours as needed for moderate pain or headache.    Marland Kitchen aspirin EC 81 MG tablet Take 1 tablet (81 mg total) by mouth daily.    Marland Kitchen atorvastatin (LIPITOR) 80 MG tablet Take 1 tablet (80 mg total) by mouth at bedtime. IM program 90 tablet 3  . Blood Glucose Monitoring Suppl (ACCU-CHEK GUIDE) w/Device KIT 1 each by Does not apply route daily. Check blood sugar as instructed up to 7 times a week 1 kit 0  . clopidogrel (PLAVIX) 75 MG tablet Take 1 tablet (75 mg total) by mouth daily. 90 tablet 1  . dorzolamide-timolol (COSOPT) 22.3-6.8 MG/ML ophthalmic solution Place 1 drop into the right eye 2 (two) times daily. 10 mL 10  . finasteride (PROSCAR) 5 MG tablet Take 1 tablet (5 mg total) by mouth daily. 90 tablet 1  . glucose blood (ACCU-CHEK GUIDE) test strip Check blood sugar up to 7 times a week as instructed 100 each 3  . polyethylene glycol (MIRALAX / GLYCOLAX) 17 g packet Take 17 g by mouth daily as needed for moderate constipation (MIX AND DRINK).     Marland Kitchen sevelamer carbonate (RENVELA) 800 MG tablet Take 800 mg by mouth See admin instructions. Take 800 mg by mouth three times a day with food on Sun/Mon/Wed/Fri and two times a day on Tues/Thurs/Sat    . vitamin B-12 (CYANOCOBALAMIN) 1000 MCG tablet Take 1 tablet (1,000 mcg total) by mouth daily. 30 tablet 0   No current facility-administered medications on file prior to visit.    Allergies:   Allergies  Allergen Reactions  . Cefepime Other (See Comments)    Pt had BAD encephalopathy from Cefepime     Today's Vitals   03/21/20 1450  BP: (!) 141/71  Pulse: 80   Height: 6' (1.829 m)   Body mass index is 21.29 kg/m.   Physical Exam General: Frail elderly African-American male, seated, in no evident distress Cardiovascular: regular rate and rhythm, no murmurs  Neurologic Exam Mental Status: Awake and fully alert. Oriented to place and time. Recent and remote memory intact. Attention span, concentration and fund of knowledge appropriate. Mood and affect appropriate.  Speech slightly hypophonic but clear without dysarthria.  Cranial Nerves: Pupils equal, briskly reactive to light. Extraocular movements full without nystagmus. Visual fields full to confrontation. Hearing intact. Facial sensation intact.  Face symmetric.  Tongue, palate moves normally and symmetrically.  Motor: Generalized atrophy BLE>BUE with 5/5 strength of mild bilateral hip flexor weakness L>R.  Difficulty fully assessing LLE distally due to pain and PAD.  Sensory.: intact to touch , pinprick , position but diminished vibratory sensation bilaterally from ankle down.  Coordination finger-to-nose and heel-to-shin performed accurately bilaterally Gait and Station: Deferred as patient currently nonambulatory s/p L foot pain reflexes: 1+ and symmetric.     ASSESSMENT/PLAN: 69 year old African-American male with subacute gait difficulties and leg weakness of unclear etiology possibly interval new strokes and deconditioning due to his ongoing renal failure and medical issues.  Remote history of lacunar infarcts in 2017 with residual mild left hemiparesis and gait impairment.  Recent admission in May 2021 for altered mental status in the setting of Pseudomonas UTI with cefepime  encephalopathy and MRI showed a tiny incidental parietal embolic infarct unlikely to explain clinical presentation.  Recent admission  02/17/2020 after syncopal episode with acute white matter bilateral strokes secondary to hypotension (HD vs orthostatic).  Since prior visit, started HD for ESRD and hx of PVD s/p LLE  arthrectomy and balloon angioplasty with plans on proceeding with L BKA    History of multiple strokes -Recovered well from recent stroke without residual deficit -Continue aspirin and Plavix and atorvastatin 80 mg daily for secondary stroke prevention -Advised wife that prolonged DAPT not indicated from a stroke standpoint and to continue to follow with cardiology and vascular surgery for ongoing duration and monitoring -Continue to follow with PCP for aggressive stroke risk factor management   Hx of HTN Hypotension, orthostatic -Antihypertensives adjusted without reoccurring hypotension episodes -Discussed importance of avoiding hypotension -BP goal<130/90 -Managed and monitored by cardiology and nephrology  HLD -LDL goal<70.  Recent LDL 61 -Continuation of atorvastatin 80 mg daily managed and monitored by PCP  PAD -Plans on undergoing L BKA scheduled on 03/30/2020 -Currently followed by vascular surgery Dr. Trula Slade   Follow-up in 4 months or call earlier if needed    I spent 40 minutes of face-to-face and non-face-to-face time with patient and wife.  This included previsit chart review, lab review, study review, order entry, electronic health record documentation, patient education/discussion regarding recent hospitalization with acute strokes in etiology, importance of managing stroke risk factors, complications with hypotension, plans on undergoing BKA, and answered all other questions to patient and wife's satisfaction   Frann Rider, AGNP-BC  [facility] [address] [place], [place] [address]  Phone [phone] Fax [phone] Note: This document was prepared with digital dictation and possible smart phrase technology. Any transcriptional errors that result from this process are unintentional.

## 2020-03-22 NOTE — Progress Notes (Signed)
I agree with the above plan 

## 2020-03-28 ENCOUNTER — Other Ambulatory Visit (HOSPITAL_COMMUNITY)
Admission: RE | Admit: 2020-03-28 | Discharge: 2020-03-28 | Disposition: A | Discharge status: Home / Self Care | Payer: Medicare PPO | Source: Ambulatory Visit | Attending: Surgery | Admitting: Surgery

## 2020-03-28 ENCOUNTER — Telehealth: Payer: Self-pay

## 2020-03-28 DIAGNOSIS — Z20822 Contact with and (suspected) exposure to covid-19: Secondary | ICD-10-CM | POA: Insufficient documentation

## 2020-03-28 DIAGNOSIS — Z01812 Encounter for preprocedural laboratory examination: Secondary | ICD-10-CM | POA: Insufficient documentation

## 2020-03-28 LAB — SARS CORONAVIRUS 2 (TAT 6-24 HRS): SARS Coronavirus 2: NEGATIVE

## 2020-03-28 NOTE — Telephone Encounter (Signed)
Spoke with pt's significant other [name] regarding change in surgery date/time for Lt BKA with Dr. Trula Slade. Advised to arrive at [facility] on 03/31/20 at 0530 AM to admitting. Pt will still complete Covid test on today. [name] verbalized understanding.

## 2020-03-30 ENCOUNTER — Encounter (HOSPITAL_COMMUNITY): Payer: Self-pay | Admitting: Surgery

## 2020-03-30 NOTE — Anesthesia Preprocedure Evaluation (Addendum)
Anesthesia Evaluation  Patient identified by MRN, date of birth, ID band Patient awake    Reviewed: Allergy & Precautions, NPO status , Patient's Chart, lab work & pertinent test results  Airway Mallampati: II  TM Distance: >3 FB Neck ROM: Full    Dental  (+) Dental Advisory Given, Edentulous Upper, Edentulous Lower   Pulmonary former smoker,    Pulmonary exam normal breath sounds clear to auscultation       Cardiovascular hypertension, + CAD, + Past MI, + Peripheral Vascular Disease and +CHF  Normal cardiovascular exam Rhythm:Regular Rate:Normal  Echo 02/18/2020 1. Left ventricular ejection fraction, by estimation, is 60 to 65%. The left ventricle has normal function. The left ventricle has no regional wall motion abnormalities. Left ventricular diastolic parameters are consistent with Grade II diastolic dysfunction (pseudonormalization).  2. Right ventricular systolic function is normal. The right ventricular size is normal.  3. Left atrial size was moderately dilated.  4. Right atrial size was mildly dilated.  5. The mitral valve is normal in structure. No evidence of mitral valve regurgitation.  6. The aortic valve is normal in structure. Aortic valve regurgitation is not visualized.    Neuro/Psych CVA    GI/Hepatic   Endo/Other  diabetes  Renal/GU Renal disease     Musculoskeletal  (+) Arthritis ,   Abdominal   Peds  Hematology  (+) anemia ,   Anesthesia Other Findings   Reproductive/Obstetrics                                                            Anesthesia Evaluation  Patient identified by MRN, date of birth, ID band Patient awake    Reviewed: Allergy & Precautions, NPO status , Patient's Chart, lab work & pertinent test results  History of Anesthesia Complications Negative for: history of anesthetic complications  Airway Mallampati: II  TM Distance: >3 FB Neck  ROM: Full    Dental  (+) Edentulous Upper, Edentulous Lower, Dental Advisory Given   Pulmonary shortness of breath, neg recent URI, former smoker,    breath sounds clear to auscultation       Cardiovascular hypertension, Pt. on medications (-) angina+ CAD, + Past MI and +CHF   Rhythm:Regular Rate:Normal  HLD  Stress Test 10/17/19 1. Findings most consistent with LEFT ventricular apex infarction with peri-infarct ischemia most severe in the anterior wall but also involving the inferior wall. Overall large defect with moderate decreased perfusion. 2. Global hypokinesia.  Transient LEFT ventricular dilatation. 3. Left ventricular ejection fraction 40% 4. Non invasive risk stratification*: High Per Cardiology, defer LHC at this time 2/2 risks of further kidney injury outweigh benefits   TTE 10/15/19 EF 40-45%, regional wall motion abnormalities present, G1DD, RV function normal, LA severely dilated, moderate pleural effusions, mild MR   Neuro/Psych CVA (2017) negative psych ROS   GI/Hepatic negative GI ROS, Neg liver ROS,   Endo/Other  negative endocrine ROSdiabetes  Renal/GU Renal InsufficiencyRenal disease (Cr 3.52, K 4.0)     Musculoskeletal  (+) Arthritis ,   Abdominal   Peds  Hematology  (+) Blood dyscrasia (Hgb 8.3), anemia ,   Anesthesia Other Findings   Reproductive/Obstetrics  Anesthesia Physical Anesthesia Plan  ASA: IV  Anesthesia Plan: MAC   Post-op Pain Management:    Induction: Intravenous  PONV Risk Score and Plan: 1 and Treatment may vary due to age or medical condition and Propofol infusion  Airway Management Planned: Nasal Cannula  Additional Equipment: None  Intra-op Plan:   Post-operative Plan:   Informed Consent: I have reviewed the patients History and Physical, chart, labs and discussed the procedure including the risks, benefits and alternatives for the proposed anesthesia  with the patient or authorized representative who has indicated his/her understanding and acceptance.     Dental advisory given  Plan Discussed with: CRNA and Surgeon  Anesthesia Plan Comments:         Anesthesia Quick Evaluation  Anesthesia Physical Anesthesia Plan  ASA: III  Anesthesia Plan: MAC   Post-op Pain Management:    Induction: Intravenous  PONV Risk Score and Plan: 1 and Ondansetron, Treatment may vary due to age or medical condition, Propofol infusion and TIVA  Airway Management Planned: Natural Airway  Additional Equipment: None  Intra-op Plan:   Post-operative Plan:   Informed Consent: I have reviewed the patients History and Physical, chart, labs and discussed the procedure including the risks, benefits and alternatives for the proposed anesthesia with the patient or authorized representative who has indicated his/her understanding and acceptance.     Dental advisory given  Plan Discussed with: CRNA  Anesthesia Plan Comments: (PAT note by Karoline Caldwell, PA-C: Recent CVA. Presented to ED on 02/17/2020 after syncopal episode.  MRI revealed acute white matter bilateral strokes in a watershed distribution.  Given watershed distribution and syncopal episode, strokes were felt likely due to hypotension post HD or orthostatic hypotension.  Antihypertensive regimen adjusted by nephrology due to recent start of HD.  MRA head negative for LVO.  Carotid ultrasound showed bilateral ICA 1 to 39% stenosis.  2D echo showed an EF of 60 to 65%.  Recommended continuation of DAPT. Last seen by neurology 03/21/2020 per note, "No residual stroke deficits and currently at baseline.  He has not had any reoccurrence of hypotension since adjustment of antihypertensives."  Recent AV fistula placed in July 2021 by Dr. Donzetta Matters. He also recently underwent Abdominal aortogram with bilateral runoff and atherectomy and angioplasty of his left posterior tibial artery due to left great toe  ulceration. This was performed by Dr. Trula Slade on 02/09/20. Despite revascularization, the patient's foot has deteriorated and he now requires BKA.  Per cardiology notes, "Hospitalized in May 2021: NSTEMI--type I vs type II. Troponins on admission 1618>2400. Unclear if this caused the decompensated heart failure vs type II NSTEMI 2/2 infection, volume overload, etc.  Decompensated heart failure.BNP 4000 on admission. Non obstructive CAD.Underwent LHC 5/13 which revealed non-obstructive LAD disease." Event monitor in July 2021 showed Normal sinus rhythm with rare PACs and PVCs. No atrial fibrillation. No pathologic arrhythmias.  Last seen by cardiologist Dr. Irish Lack 03/11/2020 and per note no further CAD work-up needed, likely demand ischemia in the past.  Recommended continue medical therapy and follow-up in 1 year.  ESRD on HD TThS.  DM2, last A1c 6.4 on 02/18/2020.  Will need day of surgery labs and evaluation.  EKG 03/02/2020: Sinus rhythm.  Rate 71. RBBB and LAFB. T wave abnormality  TTE 02/18/2020: 1. Left ventricular ejection fraction, by estimation, is 60 to 65%. The  left ventricle has normal function. The left ventricle has no regional  wall motion abnormalities. Left ventricular diastolic parameters are  consistent with Grade  II diastolic  dysfunction (pseudonormalization).  2. Right ventricular systolic function is normal. The right ventricular  size is normal.  3. Left atrial size was moderately dilated.  4. Right atrial size was mildly dilated.  5. The mitral valve is normal in structure. No evidence of mitral valve  regurgitation.  6. The aortic valve is normal in structure. Aortic valve regurgitation is  not visualized.   Carotid duplex 02/18/20: Summary:  Right Carotid: Velocities in the right ICA are consistent with a 1-39% stenosis.  Left Carotid: Velocities in the left ICA are consistent with a 1-39% stenosis.  Vertebrals: Bilateral vertebral arteries demonstrate  antegrade flow.  Subclavians: Normal flow hemodynamics were seen in bilateral subclavian arteries.   LHC 11/19/19: Mid LAD lesion is 75% stenosed. Ost LAD to Prox LAD lesion is 80% stenosed. In the cranial views, it is seen that the disease extends up to the ostial LAD, and there is significant calcification. LV end diastolic pressure is normal. There is no aortic valve stenosis.   PCI of the proximal - ostial LAD would likely require IVUS with possible atherectomy.  There is a mid to distal LAD lesion which is eccentric.    Given his lack of angina and issues with renal insufficiency, would not pursue PCI at this time.  Would reconsider based on his renal function.    )    Anesthesia Quick Evaluation

## 2020-03-30 NOTE — Progress Notes (Addendum)
Anesthesia Chart Review: Same day workup  Recent CVA. Presented to ED on 02/17/2020 after syncopal episode.  MRI revealed acute white matter bilateral strokes in a watershed distribution.  Given watershed distribution and syncopal episode, strokes were felt likely due to hypotension post HD or orthostatic hypotension.  Antihypertensive regimen adjusted by nephrology due to recent start of HD.  MRA head negative for LVO.  Carotid ultrasound showed bilateral ICA 1 to 39% stenosis.  2D echo showed an EF of 60 to 65%.  Recommended continuation of DAPT. Last seen by neurology 03/21/2020 per note, "No residual stroke deficits and currently at baseline.  He has not had any reoccurrence of hypotension since adjustment of antihypertensives."  Recent AV fistula placed in July 2021 by Dr. Donzetta Matters. He also recently underwent Abdominal aortogram with bilateral runoff and atherectomy and angioplasty of his left posterior tibial artery due to left great toe ulceration. This was performed by Dr. Trula Slade on 02/09/20. Despite revascularization, the patient's foot has deteriorated and he now requires BKA.  Per cardiology notes, "Hospitalized in May 2021: NSTEMI--type I vs type II. Troponins on admission 1618>2400. Unclear if this caused the decompensated heart failure vs type II NSTEMI 2/2 infection, volume overload, etc.  Decompensated heart failure.BNP 4000 on admission. Non obstructive CAD.Underwent LHC 5/13 which revealed non-obstructive LAD disease." Event monitor in July 2021 showed Normal sinus rhythm with rare PACs and PVCs. No atrial fibrillation. No pathologic arrhythmias.  Last seen by cardiologist Dr. Irish Lack 03/11/2020 and per note no further CAD work-up needed, likely demand ischemia in the past.  Recommended continue medical therapy and follow-up in 1 year.  ESRD on HD TThS.  DM2, last A1c 6.4 on 02/18/2020.  Will need day of surgery labs and evaluation.  EKG 03/02/2020: Sinus rhythm.  Rate 71. RBBB and LAFB. T  wave abnormality  TTE 02/18/2020: 1. Left ventricular ejection fraction, by estimation, is 60 to 65%. The  left ventricle has normal function. The left ventricle has no regional  wall motion abnormalities. Left ventricular diastolic parameters are  consistent with Grade II diastolic  dysfunction (pseudonormalization).  2. Right ventricular systolic function is normal. The right ventricular  size is normal.  3. Left atrial size was moderately dilated.  4. Right atrial size was mildly dilated.  5. The mitral valve is normal in structure. No evidence of mitral valve  regurgitation.  6. The aortic valve is normal in structure. Aortic valve regurgitation is  not visualized.   Carotid duplex 02/18/20: Summary:  Right Carotid: Velocities in the right ICA are consistent with a 1-39% stenosis.  Left Carotid: Velocities in the left ICA are consistent with a 1-39% stenosis.  Vertebrals: Bilateral vertebral arteries demonstrate antegrade flow.  Subclavians: Normal flow hemodynamics were seen in bilateral subclavian arteries.   LHC 11/19/19:  Mid LAD lesion is 75% stenosed.  Ost LAD to Prox LAD lesion is 80% stenosed. In the cranial views, it is seen that the disease extends up to the ostial LAD, and there is significant calcification.  LV end diastolic pressure is normal.  There is no aortic valve stenosis.   PCI of the proximal - ostial LAD would likely require IVUS with possible atherectomy.  There is a mid to distal LAD lesion which is eccentric.    Given his lack of angina and issues with renal insufficiency, would not pursue PCI at this time.  Would reconsider based on his renal function.    Karoline Caldwell, PA-C [facility]/Anesthesiology Phone [phone]  03/30/2020 11:41 AM

## 2020-03-30 NOTE — Progress Notes (Addendum)
PCP - Dr [name] Cardiologist - Dr Joslyn Devon Neuro -  Dr Antony Contras   Chest x-ray - 02/17/20 (2V) EKG - 03/02/20 Stress Test - 10/17/19 ECHO - 02/18/20 Cardiac Cath - 11/19/19  Fasting Blood Sugar - Unknown Checks Blood Sugar _0_ times a day DM type 2, no meds  Blood Thinner Instructions:  Stop plavix 5 days prior per MD.  Last dose was on 03/24/20 per wife [name].  Anesthesia review: Yes  STOP now taking any Aspirin (unless otherwise instructed by your surgeon), Aleve, Naproxen, Ibuprofen, Motrin, Advil, Goody's, BC's, all herbal medications, fish oil, and all vitamins.   Coronavirus Screening Covid test on 03/28/20 was negative.  Wife [name] verbalized understanding of instructions that were given via phone.

## 2020-03-31 ENCOUNTER — Encounter (HOSPITAL_COMMUNITY): Payer: Self-pay | Admitting: Surgery

## 2020-03-31 ENCOUNTER — Inpatient Hospital Stay (HOSPITAL_COMMUNITY): Payer: Medicare PPO | Admitting: Physician Assistant

## 2020-03-31 ENCOUNTER — Inpatient Hospital Stay (HOSPITAL_COMMUNITY)
Admission: RE | Admit: 2020-03-31 | Discharge: 2020-04-06 | DRG: 239 | Disposition: A | Discharge status: Skilled Nursing Facility | Payer: Medicare PPO | Attending: Student in an Organized Health Care Education/Training Program | Admitting: Student in an Organized Health Care Education/Training Program

## 2020-03-31 ENCOUNTER — Encounter (HOSPITAL_COMMUNITY)
Admission: RE | Disposition: A | Discharge status: Skilled Nursing Facility | Payer: Self-pay | Source: Home / Self Care | Attending: Student in an Organized Health Care Education/Training Program

## 2020-03-31 ENCOUNTER — Other Ambulatory Visit: Payer: Self-pay

## 2020-03-31 DIAGNOSIS — H9190 Unspecified hearing loss, unspecified ear: Secondary | ICD-10-CM | POA: Diagnosis present

## 2020-03-31 DIAGNOSIS — E11319 Type 2 diabetes mellitus with unspecified diabetic retinopathy without macular edema: Secondary | ICD-10-CM | POA: Diagnosis present

## 2020-03-31 DIAGNOSIS — I5042 Chronic combined systolic (congestive) and diastolic (congestive) heart failure: Secondary | ICD-10-CM | POA: Diagnosis present

## 2020-03-31 DIAGNOSIS — Z79899 Other long term (current) drug therapy: Secondary | ICD-10-CM

## 2020-03-31 DIAGNOSIS — Z7902 Long term (current) use of antithrombotics/antiplatelets: Secondary | ICD-10-CM

## 2020-03-31 DIAGNOSIS — E8889 Other specified metabolic disorders: Secondary | ICD-10-CM | POA: Diagnosis present

## 2020-03-31 DIAGNOSIS — L97529 Non-pressure chronic ulcer of other part of left foot with unspecified severity: Secondary | ICD-10-CM | POA: Diagnosis present

## 2020-03-31 DIAGNOSIS — E785 Hyperlipidemia, unspecified: Secondary | ICD-10-CM | POA: Diagnosis present

## 2020-03-31 DIAGNOSIS — D72829 Elevated white blood cell count, unspecified: Secondary | ICD-10-CM | POA: Diagnosis not present

## 2020-03-31 DIAGNOSIS — N2581 Secondary hyperparathyroidism of renal origin: Secondary | ICD-10-CM | POA: Diagnosis present

## 2020-03-31 DIAGNOSIS — H547 Unspecified visual loss: Secondary | ICD-10-CM | POA: Diagnosis present

## 2020-03-31 DIAGNOSIS — Z89519 Acquired absence of unspecified leg below knee: Secondary | ICD-10-CM

## 2020-03-31 DIAGNOSIS — D631 Anemia in chronic kidney disease: Secondary | ICD-10-CM | POA: Diagnosis present

## 2020-03-31 DIAGNOSIS — I10 Essential (primary) hypertension: Secondary | ICD-10-CM | POA: Diagnosis present

## 2020-03-31 DIAGNOSIS — Z992 Dependence on renal dialysis: Secondary | ICD-10-CM

## 2020-03-31 DIAGNOSIS — M1990 Unspecified osteoarthritis, unspecified site: Secondary | ICD-10-CM | POA: Diagnosis present

## 2020-03-31 DIAGNOSIS — I132 Hypertensive heart and chronic kidney disease with heart failure and with stage 5 chronic kidney disease, or end stage renal disease: Secondary | ICD-10-CM | POA: Diagnosis present

## 2020-03-31 DIAGNOSIS — Z87891 Personal history of nicotine dependence: Secondary | ICD-10-CM

## 2020-03-31 DIAGNOSIS — R54 Age-related physical debility: Secondary | ICD-10-CM | POA: Diagnosis not present

## 2020-03-31 DIAGNOSIS — H409 Unspecified glaucoma: Secondary | ICD-10-CM | POA: Diagnosis present

## 2020-03-31 DIAGNOSIS — Z83438 Family history of other disorder of lipoprotein metabolism and other lipidemia: Secondary | ICD-10-CM

## 2020-03-31 DIAGNOSIS — N186 End stage renal disease: Secondary | ICD-10-CM | POA: Diagnosis present

## 2020-03-31 DIAGNOSIS — I252 Old myocardial infarction: Secondary | ICD-10-CM

## 2020-03-31 DIAGNOSIS — I70262 Atherosclerosis of native arteries of extremities with gangrene, left leg: Secondary | ICD-10-CM | POA: Diagnosis not present

## 2020-03-31 DIAGNOSIS — E1152 Type 2 diabetes mellitus with diabetic peripheral angiopathy with gangrene: Secondary | ICD-10-CM | POA: Diagnosis present

## 2020-03-31 DIAGNOSIS — E1122 Type 2 diabetes mellitus with diabetic chronic kidney disease: Secondary | ICD-10-CM | POA: Diagnosis present

## 2020-03-31 DIAGNOSIS — Z8249 Family history of ischemic heart disease and other diseases of the circulatory system: Secondary | ICD-10-CM

## 2020-03-31 DIAGNOSIS — R509 Fever, unspecified: Secondary | ICD-10-CM | POA: Diagnosis not present

## 2020-03-31 DIAGNOSIS — Z20822 Contact with and (suspected) exposure to covid-19: Secondary | ICD-10-CM | POA: Diagnosis present

## 2020-03-31 DIAGNOSIS — Z7982 Long term (current) use of aspirin: Secondary | ICD-10-CM

## 2020-03-31 DIAGNOSIS — E11621 Type 2 diabetes mellitus with foot ulcer: Secondary | ICD-10-CM | POA: Diagnosis present

## 2020-03-31 DIAGNOSIS — I70222 Atherosclerosis of native arteries of extremities with rest pain, left leg: Principal | ICD-10-CM | POA: Diagnosis present

## 2020-03-31 DIAGNOSIS — Z8673 Personal history of transient ischemic attack (TIA), and cerebral infarction without residual deficits: Secondary | ICD-10-CM

## 2020-03-31 DIAGNOSIS — I951 Orthostatic hypotension: Secondary | ICD-10-CM | POA: Diagnosis present

## 2020-03-31 DIAGNOSIS — I2510 Atherosclerotic heart disease of native coronary artery without angina pectoris: Secondary | ICD-10-CM | POA: Diagnosis present

## 2020-03-31 HISTORY — DX: Unspecified hearing loss, unspecified ear: H91.90

## 2020-03-31 HISTORY — PX: AMPUTATION: SHX166

## 2020-03-31 HISTORY — DX: Chronic kidney disease, unspecified: N18.9

## 2020-03-31 HISTORY — DX: Peripheral vascular disease, unspecified: I73.9

## 2020-03-31 HISTORY — DX: Heart failure, unspecified: I50.9

## 2020-03-31 HISTORY — DX: Dependence on other enabling machines and devices: Z99.89

## 2020-03-31 LAB — CBC
HCT: 31.4 % — ABNORMAL LOW (ref 39.0–52.0)
Hemoglobin: 9.1 g/dL — ABNORMAL LOW (ref 13.0–17.0)
MCH: 26.2 pg (ref 26.0–34.0)
MCHC: 29 g/dL — ABNORMAL LOW (ref 30.0–36.0)
MCV: 90.5 fL (ref 80.0–100.0)
Platelets: 447 10*3/uL — ABNORMAL HIGH (ref 150–400)
RBC: 3.47 MIL/uL — ABNORMAL LOW (ref 4.22–5.81)
RDW: 15.1 % (ref 11.5–15.5)
WBC: 13.4 10*3/uL — ABNORMAL HIGH (ref 4.0–10.5)
nRBC: 0 % (ref 0.0–0.2)

## 2020-03-31 LAB — COMPREHENSIVE METABOLIC PANEL
ALT: 7 U/L (ref 0–44)
AST: 12 U/L — ABNORMAL LOW (ref 15–41)
Albumin: 2.2 g/dL — ABNORMAL LOW (ref 3.5–5.0)
Alkaline Phosphatase: 72 U/L (ref 38–126)
Anion gap: 12 (ref 5–15)
BUN: 23 mg/dL (ref 8–23)
CO2: 28 mmol/L (ref 22–32)
Calcium: 8.7 mg/dL — ABNORMAL LOW (ref 8.9–10.3)
Chloride: 95 mmol/L — ABNORMAL LOW (ref 98–111)
Creatinine, Ser: 3.8 mg/dL — ABNORMAL HIGH (ref 0.61–1.24)
GFR calc Af Amer: 18 mL/min — ABNORMAL LOW (ref >60)
GFR calc non Af Amer: 15 mL/min — ABNORMAL LOW (ref >60)
Glucose, Bld: 185 mg/dL — ABNORMAL HIGH (ref 70–99)
Potassium: 3.6 mmol/L (ref 3.5–5.1)
Sodium: 135 mmol/L (ref 135–145)
Total Bilirubin: 0.6 mg/dL (ref 0.3–1.2)
Total Protein: 6.6 g/dL (ref 6.5–8.1)

## 2020-03-31 LAB — APTT: aPTT: 33 seconds (ref 24–36)

## 2020-03-31 LAB — GLUCOSE, CAPILLARY
Glucose-Capillary: 174 mg/dL — ABNORMAL HIGH (ref 70–99)
Glucose-Capillary: 177 mg/dL — ABNORMAL HIGH (ref 70–99)
Glucose-Capillary: 162 mg/dL — ABNORMAL HIGH (ref 70–99)

## 2020-03-31 LAB — POCT I-STAT, CHEM 8
BUN: 23 mg/dL (ref 8–23)
Calcium, Ion: 1.22 mmol/L (ref 1.15–1.40)
Chloride: 96 mmol/L — ABNORMAL LOW (ref 98–111)
Creatinine, Ser: 3.5 mg/dL — ABNORMAL HIGH (ref 0.61–1.24)
Glucose, Bld: 194 mg/dL — ABNORMAL HIGH (ref 70–99)
HCT: 26 % — ABNORMAL LOW (ref 39.0–52.0)
Hemoglobin: 8.8 g/dL — ABNORMAL LOW (ref 13.0–17.0)
Potassium: 4.2 mmol/L (ref 3.5–5.1)
Sodium: 136 mmol/L (ref 135–145)
TCO2: 32 mmol/L (ref 22–32)

## 2020-03-31 LAB — PROTIME-INR
INR: 1.6 — ABNORMAL HIGH (ref 0.8–1.2)
Prothrombin Time: 18.4 seconds — ABNORMAL HIGH (ref 11.4–15.2)

## 2020-03-31 SURGERY — AMPUTATION BELOW KNEE
Anesthesia: Regional | Site: Knee | Laterality: Left

## 2020-03-31 MED ORDER — ALBUMIN HUMAN 5 % IV SOLN: INTRAVENOUS | Status: DC | PRN

## 2020-03-31 MED ORDER — ORAL CARE MOUTH RINSE: 15.0000 mL | Freq: Once | OROMUCOSAL | Status: AC

## 2020-03-31 MED ORDER — CHLORHEXIDINE GLUCONATE 0.12 % MT SOLN
OROMUCOSAL | Status: AC
  Administered 2020-03-31: 15 mL via OROMUCOSAL
  Filled 2020-03-31: qty 15

## 2020-03-31 MED ORDER — PROPOFOL 10 MG/ML IV BOLUS
INTRAVENOUS | Status: AC
  Filled 2020-03-31: qty 20

## 2020-03-31 MED ORDER — ACETAMINOPHEN 325 MG PO TABS
650.0000 mg | ORAL_TABLET | Freq: Four times a day (QID) | ORAL | Status: DC | PRN
  Administered 2020-03-31 – 2020-04-06 (×10): 650 mg via ORAL
  Filled 2020-03-31 (×10): qty 2

## 2020-03-31 MED ORDER — SODIUM CHLORIDE 0.9 % IV SOLN
INTRAVENOUS | Status: DC
  Administered 2020-03-31: 10 mL via INTRAVENOUS

## 2020-03-31 MED ORDER — SEVELAMER CARBONATE 800 MG PO TABS
800.0000 mg | ORAL_TABLET | Freq: Three times a day (TID) | ORAL | Status: DC
  Administered 2020-04-01 – 2020-04-06 (×15): 800 mg via ORAL
  Filled 2020-03-31 (×15): qty 1

## 2020-03-31 MED ORDER — PROPOFOL 10 MG/ML IV BOLUS
INTRAVENOUS | Status: DC | PRN
  Administered 2020-03-31: 20 mg via INTRAVENOUS

## 2020-03-31 MED ORDER — MIDAZOLAM HCL 2 MG/2ML IJ SOLN
INTRAMUSCULAR | Status: AC
  Filled 2020-03-31: qty 2

## 2020-03-31 MED ORDER — PROPOFOL 500 MG/50ML IV EMUL
INTRAVENOUS | Status: DC | PRN
  Administered 2020-03-31: 75 ug/kg/min via INTRAVENOUS

## 2020-03-31 MED ORDER — ROPIVACAINE HCL 7.5 MG/ML IJ SOLN
INTRAMUSCULAR | Status: DC | PRN
  Administered 2020-03-31: 25 mL via PERINEURAL
  Administered 2020-03-31: 15 mL via PERINEURAL

## 2020-03-31 MED ORDER — MEPERIDINE HCL 25 MG/ML IJ SOLN: 6.2500 mg | INTRAMUSCULAR | Status: DC | PRN

## 2020-03-31 MED ORDER — ATORVASTATIN CALCIUM 80 MG PO TABS
80.0000 mg | ORAL_TABLET | Freq: Every day | ORAL | Status: DC
  Administered 2020-03-31 – 2020-04-05 (×6): 80 mg via ORAL
  Filled 2020-03-31 (×6): qty 1

## 2020-03-31 MED ORDER — FENTANYL CITRATE (PF) 250 MCG/5ML IJ SOLN
INTRAMUSCULAR | Status: DC | PRN
Start: 2020-03-31 — End: 2020-03-31
  Administered 2020-03-31 (×2): 50 ug via INTRAVENOUS

## 2020-03-31 MED ORDER — BACITRACIN ZINC 500 UNIT/GM EX OINT
TOPICAL_OINTMENT | CUTANEOUS | Status: AC
  Filled 2020-03-31: qty 28.35

## 2020-03-31 MED ORDER — PROSOURCE TF PO LIQD
45.0000 mL | Freq: Two times a day (BID) | ORAL | Status: DC
  Administered 2020-03-31 – 2020-04-06 (×7): 45 mL
  Filled 2020-03-31 (×15): qty 45

## 2020-03-31 MED ORDER — 0.9 % SODIUM CHLORIDE (POUR BTL) OPTIME
TOPICAL | Status: DC | PRN
  Administered 2020-03-31: 1000 mL

## 2020-03-31 MED ORDER — DOXERCALCIFEROL 4 MCG/2ML IV SOLN
2.0000 ug | INTRAVENOUS | Status: DC
  Filled 2020-03-31 (×2): qty 2

## 2020-03-31 MED ORDER — HEPARIN SODIUM (PORCINE) 1000 UNIT/ML IJ SOLN
1000.0000 [IU] | INTRAMUSCULAR | Status: DC | PRN
  Filled 2020-03-31: qty 1

## 2020-03-31 MED ORDER — CHLORHEXIDINE GLUCONATE 0.12 % MT SOLN: 15.0000 mL | Freq: Once | OROMUCOSAL | Status: AC

## 2020-03-31 MED ORDER — ONDANSETRON HCL 4 MG/2ML IJ SOLN
INTRAMUSCULAR | Status: DC | PRN
  Administered 2020-03-31: 4 mg via INTRAVENOUS

## 2020-03-31 MED ORDER — VANCOMYCIN HCL IN DEXTROSE 1-5 GM/200ML-% IV SOLN
1000.0000 mg | INTRAVENOUS | Status: AC
  Administered 2020-03-31: 1000 mg via INTRAVENOUS
  Filled 2020-03-31: qty 200

## 2020-03-31 MED ORDER — CLONIDINE HCL (ANALGESIA) 100 MCG/ML EP SOLN
EPIDURAL | Status: DC | PRN
  Administered 2020-03-31: 30 ug
  Administered 2020-03-31: 50 ug

## 2020-03-31 MED ORDER — CHLORHEXIDINE GLUCONATE CLOTH 2 % EX PADS: 6.0000 | MEDICATED_PAD | Freq: Once | CUTANEOUS | Status: DC

## 2020-03-31 MED ORDER — HEPARIN SODIUM (PORCINE) 1000 UNIT/ML IJ SOLN
INTRAMUSCULAR | Status: AC
  Administered 2020-03-31: 4600 [IU] via INTRAVENOUS
  Filled 2020-03-31: qty 4

## 2020-03-31 MED ORDER — HYDROMORPHONE HCL 1 MG/ML IJ SOLN: 0.2500 mg | INTRAMUSCULAR | Status: DC | PRN

## 2020-03-31 MED ORDER — ACETAMINOPHEN 650 MG RE SUPP: 650.0000 mg | Freq: Four times a day (QID) | RECTAL | Status: DC | PRN

## 2020-03-31 MED ORDER — BUPIVACAINE LIPOSOME 1.3 % IJ SUSP
INTRAMUSCULAR | Status: DC | PRN
  Administered 2020-03-31: 100 mL

## 2020-03-31 MED ORDER — CHLORHEXIDINE GLUCONATE CLOTH 2 % EX PADS: 6.0000 | MEDICATED_PAD | Freq: Every day | CUTANEOUS | Status: DC

## 2020-03-31 MED ORDER — ASPIRIN EC 81 MG PO TBEC
81.0000 mg | DELAYED_RELEASE_TABLET | Freq: Every day | ORAL | Status: DC
  Administered 2020-04-01 – 2020-04-06 (×6): 81 mg via ORAL
  Filled 2020-03-31 (×6): qty 1

## 2020-03-31 MED ORDER — PHENYLEPHRINE HCL-NACL 10-0.9 MG/250ML-% IV SOLN
INTRAVENOUS | Status: DC | PRN
  Administered 2020-03-31: 30 ug/min via INTRAVENOUS

## 2020-03-31 MED ORDER — PROMETHAZINE HCL 25 MG/ML IJ SOLN: 6.2500 mg | INTRAMUSCULAR | Status: DC | PRN

## 2020-03-31 MED ORDER — FENTANYL CITRATE (PF) 250 MCG/5ML IJ SOLN
INTRAMUSCULAR | Status: AC
  Filled 2020-03-31: qty 5

## 2020-03-31 MED ORDER — RENA-VITE PO TABS
1.0000 | ORAL_TABLET | Freq: Every day | ORAL | Status: DC
  Administered 2020-03-31 – 2020-04-05 (×6): 1 via ORAL
  Filled 2020-03-31 (×6): qty 1

## 2020-03-31 MED ORDER — FINASTERIDE 5 MG PO TABS
5.0000 mg | ORAL_TABLET | Freq: Every day | ORAL | Status: DC
  Administered 2020-04-01 – 2020-04-06 (×6): 5 mg via ORAL
  Filled 2020-03-31 (×6): qty 1

## 2020-03-31 SURGICAL SUPPLY — 65 items
BANDAGE ESMARK 6X9 LF (GAUZE/BANDAGES/DRESSINGS) ×1 IMPLANT
BLADE LONG MED 31X9 (MISCELLANEOUS) ×2 IMPLANT
BLADE SAW GIGLI 510 (BLADE) ×2 IMPLANT
BNDG CMPR 9X6 STRL LF SNTH (GAUZE/BANDAGES/DRESSINGS) ×1
BNDG COHESIVE 6X5 TAN STRL LF (GAUZE/BANDAGES/DRESSINGS) ×2 IMPLANT
BNDG ELASTIC 4X5.8 VLCR STR LF (GAUZE/BANDAGES/DRESSINGS) ×2 IMPLANT
BNDG ELASTIC 6X5.8 VLCR STR LF (GAUZE/BANDAGES/DRESSINGS) ×2 IMPLANT
BNDG ESMARK 6X9 LF (GAUZE/BANDAGES/DRESSINGS) ×2
BNDG GAUZE ELAST 4 BULKY (GAUZE/BANDAGES/DRESSINGS) ×2 IMPLANT
CANISTER SUCT 3000ML PPV (MISCELLANEOUS) ×2 IMPLANT
CLIP VESOCCLUDE MED 6/CT (CLIP) ×4 IMPLANT
COVER SURGICAL LIGHT HANDLE (MISCELLANEOUS) ×2 IMPLANT
COVER WAND RF STERILE (DRAPES) ×2 IMPLANT
CUFF TOURN SGL QUICK 24 (TOURNIQUET CUFF) ×2
CUFF TOURN SGL QUICK 34 (TOURNIQUET CUFF)
CUFF TOURN SGL QUICK 42 (TOURNIQUET CUFF) IMPLANT
CUFF TRNQT CYL 24X4X16.5-23 (TOURNIQUET CUFF) ×1 IMPLANT
CUFF TRNQT CYL 34X4.125X (TOURNIQUET CUFF) IMPLANT
DRAIN CHANNEL 19F RND (DRAIN) IMPLANT
DRAPE HALF SHEET 40X57 (DRAPES) ×2 IMPLANT
DRAPE INCISE IOBAN 66X45 STRL (DRAPES) IMPLANT
DRAPE ORTHO SPLIT 77X108 STRL (DRAPES) ×4
DRAPE SURG ORHT 6 SPLT 77X108 (DRAPES) ×2 IMPLANT
DRESSING PREVENA PLUS CUSTOM (GAUZE/BANDAGES/DRESSINGS) IMPLANT
DRSG ADAPTIC 3X8 NADH LF (GAUZE/BANDAGES/DRESSINGS) ×2 IMPLANT
DRSG EMULSION OIL 3X3 NADH (GAUZE/BANDAGES/DRESSINGS) ×4 IMPLANT
DRSG PREVENA PLUS CUSTOM (GAUZE/BANDAGES/DRESSINGS)
ELECT REM PT RETURN 9FT ADLT (ELECTROSURGICAL) ×2
ELECTRODE REM PT RTRN 9FT ADLT (ELECTROSURGICAL) ×1 IMPLANT
EVACUATOR SILICONE 100CC (DRAIN) IMPLANT
GAUZE 4X4 16PLY RFD (DISPOSABLE) ×2 IMPLANT
GAUZE SPONGE 4X4 12PLY STRL (GAUZE/BANDAGES/DRESSINGS) ×2 IMPLANT
GLOVE BIO SURGEON STRL SZ 6.5 (GLOVE) ×6 IMPLANT
GLOVE BIOGEL PI IND STRL 7.5 (GLOVE) ×1 IMPLANT
GLOVE BIOGEL PI INDICATOR 7.5 (GLOVE) ×1
GLOVE SURG SS PI 7.5 STRL IVOR (GLOVE) ×2 IMPLANT
GOWN STRL REUS W/ TWL LRG LVL3 (GOWN DISPOSABLE) ×2 IMPLANT
GOWN STRL REUS W/ TWL XL LVL3 (GOWN DISPOSABLE) ×1 IMPLANT
GOWN STRL REUS W/TWL LRG LVL3 (GOWN DISPOSABLE) ×4
GOWN STRL REUS W/TWL XL LVL3 (GOWN DISPOSABLE) ×2
KIT BASIN OR (CUSTOM PROCEDURE TRAY) ×2 IMPLANT
KIT TURNOVER KIT B (KITS) ×2 IMPLANT
NEEDLE 18GX1X1/2 (RX/OR ONLY) (NEEDLE) ×2 IMPLANT
NS IRRIG 1000ML POUR BTL (IV SOLUTION) ×2 IMPLANT
PACK GENERAL/GYN (CUSTOM PROCEDURE TRAY) ×2 IMPLANT
PAD ARMBOARD 7.5X6 YLW CONV (MISCELLANEOUS) ×4 IMPLANT
PREVENA RESTOR ARTHOFORM 46X30 (CANNISTER) IMPLANT
SPONGE LAP 18X18 RF (DISPOSABLE) ×2 IMPLANT
STAPLER VISISTAT 35W (STAPLE) ×2 IMPLANT
STOCKINETTE IMPERVIOUS LG (DRAPES) ×2 IMPLANT
SUT BONE WAX W31G (SUTURE) IMPLANT
SUT ETHILON 3 0 PS 1 (SUTURE) IMPLANT
SUT SILK 0 TIES 10X30 (SUTURE) ×2 IMPLANT
SUT SILK 2 0 (SUTURE) ×2
SUT SILK 2 0 SH CR/8 (SUTURE) ×2 IMPLANT
SUT SILK 2-0 18XBRD TIE 12 (SUTURE) ×1 IMPLANT
SUT SILK 3 0 (SUTURE) ×2
SUT SILK 3-0 18XBRD TIE 12 (SUTURE) ×1 IMPLANT
SUT VIC AB 2-0 CT1 18 (SUTURE) ×6 IMPLANT
SYR 50ML LL SCALE MARK (SYRINGE) ×2 IMPLANT
SYR BULB IRRIG 60ML STRL (SYRINGE) ×2 IMPLANT
TAPE UMBILICAL COTTON 1/8X30 (MISCELLANEOUS) ×2 IMPLANT
TOWEL GREEN STERILE (TOWEL DISPOSABLE) ×4 IMPLANT
UNDERPAD 30X36 HEAVY ABSORB (UNDERPADS AND DIAPERS) ×2 IMPLANT
WATER STERILE IRR 1000ML POUR (IV SOLUTION) ×2 IMPLANT

## 2020-03-31 NOTE — Progress Notes (Signed)
1845 Received pt from HD unit post hemodialysis. Pt had Left BKA done today and went to HD from PACU. Left BKA dressing with ace wrap dry and intact. A&O x2, forgetful. Call light with in reach.

## 2020-03-31 NOTE — Procedures (Signed)
   I was present at this dialysis session, have reviewed the session itself and made  appropriate changes Kelly Splinter MD [facility] pager [phone]   03/31/2020, 3:24 PM

## 2020-03-31 NOTE — Anesthesia Procedure Notes (Signed)
Anesthesia Regional Block: Popliteal block   Pre-Anesthetic Checklist: ,, timeout performed, Correct Patient, Correct Site, Correct Laterality, Correct Procedure, Correct Position, site marked, Risks and benefits discussed,  Surgical consent,  Pre-op evaluation,  At surgeon's request and post-op pain management  Laterality: Left  Prep: chloraprep       Needles:  Injection technique: Single-shot  Needle Type: Stimiplex     Needle Length: 10cm  Needle Gauge: 21     Additional Needles:   Procedures:,,,, ultrasound used (permanent image in chart),,,,  Motor weakness within 5 minutes.   Nerve Stimulator or Paresthesia:  Response: 0.5 mA,   Additional Responses:   Narrative:  Start time: 03/31/2020 7:21 AM End time: 03/31/2020 7:26 AM Injection made incrementally with aspirations every 5 mL.  Performed by: Personally  Anesthesiologist: Nolon Nations, MD  Additional Notes: Nerve located and needle positioned with direct ultrasound guidance. Good perineural spread. Patient tolerated well.

## 2020-03-31 NOTE — Transfer of Care (Signed)
Immediate Anesthesia Transfer of Care Note  Patient: Dennis Macias  Procedure(s) Performed: LEFT BELOW KNEE AMPUTATION (Left Knee)  Patient Location: PACU  Anesthesia Type:MAC  Level of Consciousness: drowsy and patient cooperative  Airway & Oxygen Therapy: Patient Spontanous Breathing  Post-op Assessment: Report given to RN, Post -op Vital signs reviewed and stable and Patient moving all extremities X 4  Post vital signs: Reviewed and stable  Last Vitals:  Vitals Value Taken Time  BP    Temp    Pulse    Resp    SpO2      Last Pain:  Vitals:   03/31/20 0609  PainSc: 0-No pain         Complications: No complications documented.

## 2020-03-31 NOTE — Consult Note (Signed)
[facility] Renal Consultation Note    Indication for Consultation:  Management of ESRD/hemodialysis; anemia, hypertension/volume and secondary hyperparathyroidism PCP: Rylee [name]  HPI: Gearl Baratta is a 69 y.o. male with ESRD on hemodialysis T,Th,S at [facility]. PMH: DM, HTN, CVA, CAD, PAD, ischemic CM (EF 35-40%, grade II DD, normal RV function), chronic systolic and diastolic CHF, nephrotic syndrome due to diabetic nephropathy. He was admitted for today for L BKA per Dr. Trula Slade as observation patient. He is currently being seen on HD. He is still somnolent from anesthesia, has no complaints. Tolerating HD without issues.   Past Medical History:  Diagnosis Date  . Anemia   . Arthritis    past hx   . Blindness    right eye r/t diabetes per wife [name]  . Cardiorenal syndrome   . Cataract    removed both eyes  . CHF (congestive heart failure) (HCC)    hx  . CKD (chronic kidney disease)    Dialysis T Th Sat  . Dehydration   . Diabetes ([name])    type 2 - diet controlled, no meds  . Glaucoma   . History of CVA (cerebrovascular accident) 09/13/2015  . History of stroke 09/13/2015  . History of urinary retention   . HOH (hard of hearing)    no hearing aids  . Hyperlipidemia   . Hypertension   . NSTEMI (non-ST elevated myocardial infarction) ([place])   . Peripheral vascular disease ([place])   . Pernicious anemia 02/24/2018  . S/P TURP   . Stroke [facility])    2017- March, no deficit   . Syncope 11/2019  . Tachycardia 08/26/2017  . Tubular adenoma of colon 02/2017  . Walker as ambulation aid    and occasional uses cane  . Weight loss, non-intentional 08/26/2017   10 lbs between 6/18 & 2/19   Past Surgical History:  Procedure Laterality Date  . ABDOMINAL AORTOGRAM W/LOWER EXTREMITY N/A 02/09/2020   Procedure: ABDOMINAL AORTOGRAM W/LOWER EXTREMITY;  Surgeon: Serafina Mitchell, MD;  Location: [place] [facility];  Service: Cardiovascular;  Laterality: N/A;   . AV FISTULA PLACEMENT Right 01/19/2020   Procedure: BRACHIOCEPHALIC ARTERIOVENOUS (AV) FISTULA CREATION;  Surgeon: Waynetta Sandy, MD;  Location: [place];  Service: Vascular;  Laterality: Right;  . CATARACT EXTRACTION, BILATERAL    . COLONOSCOPY    . IR FLUORO GUIDE CV LINE RIGHT  01/14/2020  . IR THORACENTESIS ASP PLEURAL SPACE W/IMG GUIDE  09/21/2019  . IR THORACENTESIS ASP PLEURAL SPACE W/IMG GUIDE  10/16/2019  . IR US GUIDE VASC ACCESS RIGHT  01/15/2020  . LEFT HEART CATH AND CORONARY ANGIOGRAPHY N/A 11/19/2019   Procedure: LEFT HEART CATH AND CORONARY ANGIOGRAPHY;  Surgeon: Jettie Booze, MD;  Location: [place] [facility];  Service: Cardiovascular;  Laterality: N/A;  . PERIPHERAL VASCULAR INTERVENTION Left 02/09/2020   Procedure: PERIPHERAL VASCULAR INTERVENTION;  Surgeon: Serafina Mitchell, MD;  Location: [place] [facility];  Service: Cardiovascular;  Laterality: Left;  . POLYPECTOMY    . REFRACTIVE SURGERY  10/2017  . TEE WITHOUT CARDIOVERSION N/A 09/14/2015   Procedure: TRANSESOPHAGEAL ECHOCARDIOGRAM (TEE);  Surgeon: Larey Dresser, MD;  Location: [place];  Service: Cardiovascular;  Laterality: N/A;  . TRANSURETHRAL RESECTION OF PROSTATE N/A 10/20/2019   Procedure: TRANSURETHRAL RESECTION OF THE PROSTATE (TURP);  Surgeon: Irine Seal, MD;  Location: [facility];  Service: Urology;  Laterality: N/A;  . UPPER GASTROINTESTINAL ENDOSCOPY     Family History  Problem  Relation Age of Onset  . Hypertension Mother   . Hyperlipidemia Mother   . Hyperlipidemia Father   . Colon cancer Neg Hx   . Colon polyps Neg Hx   . Esophageal cancer Neg Hx   . Rectal cancer Neg Hx   . Stomach cancer Neg Hx    Social History:  reports that he has quit smoking. His smoking use included cigarettes. He quit smokeless tobacco use about 41 years ago.  His smokeless tobacco use included chew. He reports previous alcohol use of about 1.0 standard drink of alcohol per week. He reports that he does not use  drugs. Allergies  Allergen Reactions  . Cefepime Other (See Comments)    Pt had BAD encephalopathy from Cefepime   Prior to Admission medications   Medication Sig Start Date End Date Taking? Authorizing Provider  acetaminophen (TYLENOL) 500 MG tablet Take 500 mg by mouth every 6 (six) hours as needed for moderate pain or headache.   Yes [provider]  aspirin EC 81 MG tablet Take 1 tablet (81 mg total) by mouth daily. 10/22/19  Yes Eugenie Filler, MD  atorvastatin (LIPITOR) 80 MG tablet Take 1 tablet (80 mg total) by mouth at bedtime. IM program 02/29/20  Yes Madalyn Rob, MD  clopidogrel (PLAVIX) 75 MG tablet Take 1 tablet (75 mg total) by mouth daily. 02/29/20  Yes Madalyn Rob, MD  dorzolamide-timolol (COSOPT) 22.3-6.8 MG/ML ophthalmic solution Place 1 drop into the right eye 2 (two) times daily. 12/03/19  Yes Angiulli, Lavon Paganini, PA-C  finasteride (PROSCAR) 5 MG tablet Take 1 tablet (5 mg total) by mouth daily. 02/29/20  Yes Madalyn Rob, MD  oxyCODONE-acetaminophen (PERCOCET/ROXICET) 5-325 MG tablet Take 1 tablet by mouth every 6 (six) hours as needed for moderate pain or severe pain.   Yes [provider]  polyethylene glycol (MIRALAX / GLYCOLAX) 17 g packet Take 17 g by mouth daily as needed for moderate constipation.    Yes [provider]  sevelamer carbonate (RENVELA) 800 MG tablet Take 800 mg by mouth See admin instructions. Take 800 mg by mouth three times a day with food on Sun/Mon/Wed/Fri and two times a day on Tues/Thurs/Sat   Yes [provider]  vitamin B-12 (CYANOCOBALAMIN) 1000 MCG tablet Take 1 tablet (1,000 mcg total) by mouth daily. 12/03/19  Yes Angiulli, Lavon Paganini, PA-C  Accu-Chek FastClix Lancets MISC Check blood sugar up to 7 times a week as instructed 12/03/19   Angiulli, Lavon Paganini, PA-C  Blood Glucose Monitoring Suppl (ACCU-CHEK GUIDE) w/Device KIT 1 each by Does not apply route daily. Check blood sugar as instructed up to 7 times a week  12/03/19   Angiulli, Lavon Paganini, PA-C  glucose blood (ACCU-CHEK GUIDE) test strip Check blood sugar up to 7 times a week as instructed 12/03/19   Angiulli, Lavon Paganini, PA-C   Current Facility-Administered Medications  Medication Dose Route Frequency Provider Last Rate Last Admin  . 0.9 %  sodium chloride infusion   Intravenous Continuous [name], MD 10 mL/hr at 03/31/20 0735 Restarted at 03/31/20 0958  . acetaminophen (TYLENOL) tablet 650 mg  650 mg Oral Q6H PRN [name], MD       Or  . acetaminophen (TYLENOL) suppository 650 mg  650 mg Rectal Q6H PRN [name], MD      . Chlorhexidine Gluconate Cloth 2 % PADS 6 each  6 each Topical Once [name], MD  And  . Chlorhexidine Gluconate Cloth 2 % PADS 6 each  6 each Topical Once [name], MD      . Chlorhexidine Gluconate Cloth 2 % PADS 6 each  6 each Topical Q0600 Roney Jaffe, MD       Labs: Basic Metabolic Panel: Recent Labs  Lab 03/31/20 0545 03/31/20 0945  NA 135 136  K 3.6 4.2  CL 95* 96*  CO2 28  --   GLUCOSE 185* 194*  BUN 23 23  CREATININE 3.80* 3.50*  CALCIUM 8.7*  --    Liver Function Tests: Recent Labs  Lab 03/31/20 0545  AST 12*  ALT 7  ALKPHOS 72  BILITOT 0.6  PROT 6.6  ALBUMIN 2.2*   No results for input(s): LIPASE, AMYLASE in the last 168 hours. No results for input(s): AMMONIA in the last 168 hours. CBC: Recent Labs  Lab 03/31/20 0547 03/31/20 0945  WBC 13.4*  --   HGB 9.1* 8.8*  HCT 31.4* 26.0*  MCV 90.5  --   PLT 447*  --    Cardiac Enzymes: No results for input(s): CKTOTAL, CKMB, CKMBINDEX, TROPONINI in the last 168 hours. CBG: Recent Labs  Lab 03/31/20 0600 03/31/20 1014 03/31/20 1358  GLUCAP 174* 177* 162*   Iron Studies: No results for input(s): IRON, TIBC, TRANSFERRIN, FERRITIN in the last 72 hours. Studies/Results: No results found.  ROS: As per HPI otherwise negative.   Physical Exam: Vitals:   03/31/20 1245 03/31/20 1300  03/31/20 1330 03/31/20 1400  BP: 103/62 102/63 108/62 109/72  Pulse: (!) 56 63 61 (!) 57  Resp: 12 13 12 11   Temp:   (!) 97.5 F (36.4 C)   SpO2: 100% 100% 96% 100%  Weight:      Height:         General: Chronically ill appearing male in no acute distress. Head: Normocephalic, atraumatic, sclera non-icteric, mucus membranes are moist Neck: Supple. JVD not elevated. Lungs: Clear bilaterally to auscultation without wheezes, rales, or rhonchi. Breathing is unlabored. Heart: RRR with S1 S2. No murmurs, rubs, or gallops appreciated. Abdomen: Soft, non-tender, non-distended with normoactive bowel sounds. No rebound/guarding. No obvious abdominal masses. Lower extremities: L BKA with ACE wrap. No RLE edema. Neuro: Alert and oriented X 3. Moves all extremities spontaneously. Psych:  Responds to questions appropriately with a normal affect. Dialysis Access: RIJ [facility] drsg CDI. R AVF + bruit  Dialysis Orders: [place] T,Th,S 4 hrs 180NRe 400/600 68.5 kg 2.0 K/2.25 Ca RIJ TDC/R AVF -Heparin 2000 units IV TIW -Hectorol 2 mcg IV TIW -Venofer 100 mg IV X 10 doses (1/10 doses given 1st dose 03/29/2020) -Venofer 50 mg IV weekly when Fe load completed -Mircera 150 mcg IV q 2 weeks (Last 03/23/2020)   Assessment/Plan: 1.  LLE gangrene/PAD S/P L BKA per Dr. Trula Slade 03/31/2020. Per primary/VVS.  2.  ESRD - T,Th,S via [facility]DC. HD today on schedule. No heparin. K+ 4.2.   3.  Hypertension/volume  - BP on soft side. No antihypertensive meds. No evidence of volume overload. UF as tolerated.  4.  Anemia  - HGB 9.1 on admission. Recent ESA dose. Was getting Fe load as OP. WBC elevated. Hold Fe load for now. Transfuse as needed.  5.  Metabolic bone disease - OP BMD labs at goal. Continue binders, VDRA.  6.  Nutrition - Renal/Carb mod diet. Albumin low. Add protein supps, renal vits.  7. DMT2-per primary  Jimmye Norman. Owens Shark, NP-C 03/31/2020, 2:37 PM  [facility]  Beeper [phone]

## 2020-03-31 NOTE — H&P (Addendum)
Date: 03/31/2020               Patient Name:  Dennis Macias MRN: [id number]  DOB: 22-Oct-1950 Age / Sex: 69 y.o., male   PCP: [name], MD         Medical Service: Internal Medicine Teaching Service         Attending Physician: Dr. Evette Doffing, Mallie Mussel, *    First Contact: Dr. Candie Chroman Pager: [phone]  Second Contact: Dr. Marianna Payment Pager: [phone]       After Hours (After 5p/  First Contact Pager: [phone]  weekends / holidays): Second Contact Pager: [phone]   Chief Complaint: Left toe ulcer s/p BKA  History of Present Illness: This is a 69 year old male with a history of ESRD on HD TTS, HTN, CVA, DM, and PAD s/p failed revascularization who continued to have deterioration of his wound, vascular brought into the hospital for a left below knee amputation. Patient was seen following his procedure and was very tired on exam. He denied any pain at this time, denied any specific complaints.  He underwent revascularization of his right femoral artery but continued to have left foot deterioration and new wound, as well as significant pain.  Followed up with vascular surgery who discussed gust the possibility of nonhealing and conversion to more proximal amputation, scheduled for below-knee amputation.  Patient under went left BKA with MAC anestheia with regional block. Vascular surgery following.  Vitals relatively stable, 56-60s blood pressure currently 121/70 currently on room air.  CMP obtained yesterday showed normal electrolytes, glucose 185, creatinine 3.8, albumin 2.2.  WBC 13.4, hemoglobin 9.1, platelets 447.  Covid test negative. Admitted to IMTS.   Meds:  Current Meds  Medication Sig  . acetaminophen (TYLENOL) 500 MG tablet Take 500 mg by mouth every 6 (six) hours as needed for moderate pain or headache.  Marland Kitchen aspirin EC 81 MG tablet Take 1 tablet (81 mg total) by mouth daily.  Marland Kitchen atorvastatin (LIPITOR) 80 MG tablet Take 1 tablet (80 mg total) by mouth at bedtime. IM program  .  clopidogrel (PLAVIX) 75 MG tablet Take 1 tablet (75 mg total) by mouth daily.  . dorzolamide-timolol (COSOPT) 22.3-6.8 MG/ML ophthalmic solution Place 1 drop into the right eye 2 (two) times daily.  . finasteride (PROSCAR) 5 MG tablet Take 1 tablet (5 mg total) by mouth daily.  Marland Kitchen oxyCODONE-acetaminophen (PERCOCET/ROXICET) 5-325 MG tablet Take 1 tablet by mouth every 6 (six) hours as needed for moderate pain or severe pain.  . polyethylene glycol (MIRALAX / GLYCOLAX) 17 g packet Take 17 g by mouth daily as needed for moderate constipation.   . sevelamer carbonate (RENVELA) 800 MG tablet Take 800 mg by mouth See admin instructions. Take 800 mg by mouth three times a day with food on Sun/Mon/Wed/Fri and two times a day on Tues/Thurs/Sat  . vitamin B-12 (CYANOCOBALAMIN) 1000 MCG tablet Take 1 tablet (1,000 mcg total) by mouth daily.    Allergies: Allergies as of 03/21/2020 - Review Complete 03/21/2020  Allergen Reaction Noted  . Cefepime Other (See Comments) 12/14/2019   Past Medical History:  Diagnosis Date  . Anemia   . Arthritis    past hx   . Blindness    right eye r/t diabetes per wife [name]  . Cardiorenal syndrome   . Cataract    removed both eyes  . CHF (congestive heart failure) (HCC)    hx  . CKD (chronic kidney disease)    Dialysis  T Th Sat  . Dehydration   . Diabetes ([place])    type 2 - diet controlled, no meds  . Glaucoma   . History of CVA (cerebrovascular accident) 09/13/2015  . History of stroke 09/13/2015  . History of urinary retention   . HOH (hard of hearing)    no hearing aids  . Hyperlipidemia   . Hypertension   . NSTEMI (non-ST elevated myocardial infarction) ([place])   . Peripheral vascular disease ([place])   . Pernicious anemia 02/24/2018  . S/P TURP   . Stroke [facility])    2017- March, no deficit   . Syncope 11/2019  . Tachycardia 08/26/2017  . Tubular adenoma of colon 02/2017  . Walker as ambulation aid    and occasional uses cane  . Weight loss, non-intentional  08/26/2017   10 lbs between 6/18 & 2/19    Family History:  Family History  Problem Relation Age of Onset  . Hypertension Mother   . Hyperlipidemia Mother   . Hyperlipidemia Father   . Colon cancer Neg Hx   . Colon polyps Neg Hx   . Esophageal cancer Neg Hx   . Rectal cancer Neg Hx   . Stomach cancer Neg Hx    Social History: Previously chewed tobacco, stopped in 1980. Stopped drinking in 2017. Denies any recreational drug use.   Review of Systems: A complete ROS was negative except as per HPI.   Physical Exam: Blood pressure 98/62, pulse (!) 58, temperature 97.7 F (36.5 C), resp. rate 11, height 6' (1.829 m), weight 70.3 kg, SpO2 100 %. Physical Exam Constitutional:      Comments: Tired appearing, falling asleep  HENT:     Head: Normocephalic and atraumatic.     Mouth/Throat:     Mouth: Mucous membranes are moist.  Cardiovascular:     Rate and Rhythm: Normal rate and regular rhythm.     Pulses: Normal pulses.     Heart sounds: Normal heart sounds.  Pulmonary:     Effort: Pulmonary effort is normal. No respiratory distress.     Breath sounds: Normal breath sounds.  Abdominal:     General: Abdomen is flat. Bowel sounds are normal.     Palpations: Abdomen is soft.     Tenderness: There is no abdominal tenderness. There is no guarding or rebound.  Musculoskeletal:     Cervical back: Normal range of motion and neck supple.     Comments: Left BKA, wrapped in bandage  Skin:    General: Skin is warm and dry.     Capillary Refill: Capillary refill takes less than 2 seconds.     Comments: AV fistula in R arm  Neurological:     Comments: Sleeping  Psychiatric:     Comments: Unable to assess     Assessment & Plan by Problem: Active Problems:   S/P BKA (below knee amputation) (HCC)  This is a 69 year old male with a history of ESRD on HD TTS, HTN, CVA, DM, and PAD s/p failed revascularization who continued to have deterioration of his wound, vascular brought into the  hospital for a left below knee amputation. Admitted following procedure.  Left lower extremity PAD with gangrene s/p left BKA: Wound is wrapped in bandage, no obvious bleeding noted.  Denies any pain at this time, has Dilaudid as needed for pain control. Received regional anesthesia, popliteal block, and adductor canal block.   -Vascular following, appreciate recommendations -Pain control with dilaudid PRN -Continue atorvastatin 80 mg  daily -Continue home ASA -F/u vascular recommendations for resumption of plavix  ESRD on HD TTS: Last dialysis session on 9/21, not volume overloaded on exam. No significant electrolyte abnormalities. Consulting nephrology for resumption of dialysis.   -Nephrology consulted, appreciate assistance -Going for dialysis today -Monitor renal function  Prior CVA: Patient currently on Plavix daily for this, no residual deficits noted.  Follows with neurology, last seen on 9/13, no changes noted at that time.   -Resume home ASA -Holding home Plavix -Resume home atorvastatin 80 mg daily  History of hypertension: History of orthostatic hypotension: Patient had developed acute CVA secondary to hypotension, either HD versus orthostatic.  Goal blood pressure less than 130/90.  Currently on any blood pressure medications, will continue to monitor.  DM: Diet controlled, last A1c was 6.4.  Will monitor CBGs.  Dispo: Admit patient to Observation with expected length of stay less than 2 midnights.  Signed: [name], MD 03/31/2020, 11:22 AM  Pager: [phone] After 5pm on weekdays and 1pm on weekends: On Call pager: [phone]

## 2020-03-31 NOTE — Op Note (Signed)
    Patient name: Dennis Macias MRN: [id number] DOB: [birth year] Sex: male  03/31/2020 Pre-operative Diagnosis: Ischemic ulcers to left foot Post-operative diagnosis:  Same Surgeon:  [name] Assistants: Aldona Bar Ryne Procedure:   Left below-knee amputation Anesthesia: MAC with block Blood Loss: 300 cc Specimens: Left leg  Findings: Excellent bleeding at the amputation site  Indications: This is a 69 year old gentleman who presented with ischemic ulcers to his left foot.  Despite revascularization, his foot has become nonviable and amputation is recommended.  Procedure:  The patient was identified in the holding area and taken to [place] [facility]  The patient was then placed supine on the table. regional and MAC anesthesia was administered.  The patient was prepped and draped in the usual sterile fashion.  A time out was called and antibiotics were administered.  A PA was necessary to facilitate the technical details of the procedure.  A two thirds one third posterior flap was created beginning 12 cm distal to the tibial tuberosity.  Cautery is used divide subcutaneous tissue.  The tibia was circumferentially exposed.  A periosteal elevator was used to elevate the periosteum.  The tibia was transected with a Gigli saw, beveling the anterior surface.  I then dissected down to the fibula.  The patient had a very large circumferentially calcified anterior tibial artery that was injured.  This was difficult to control, necessitating tourniquet placement on the thigh.  I ultimately had to use a reciprocating saw to transect the fibula.  Once this was done I placed clamps on the neurovascular bundle and divided it.  An amputation knife was then used to remove the remaining tissue.  The leg was sent as a specimen.  I then placed a right angle clamp on the proximal anterior tibial artery and ligated it with silk ties.  The neurovascular bundle was then dissected out.  The nerve was transected proximal to the  cut edge of the tibia.  It was infiltrated with Exparel.  Similarly the artery and vein were ligated between silk ties.  The wound was then copiously irrigated and hemostasis was achieved.  A rasp was used to smooth the bone surface.  The reciprocating saw was then used to resect an additional portion of the fibula so that it was proximal to the cut edge of the tibia.  Once hemostasis was satisfactory, the fascia was reapproximated with interrupted 2-0 Vicryl suture and the skin was closed with staples.  Sterile dressings were applied.  There were no immediate complications.   Disposition: To PACU stable.   Theotis Burrow, M.D., [facility] [place] Office: [phone] Pager:  [phone]

## 2020-03-31 NOTE — Anesthesia Postprocedure Evaluation (Signed)
Anesthesia Post Note  Patient: Dennis Macias  Procedure(s) Performed: LEFT BELOW KNEE AMPUTATION (Left Knee)     Patient location during evaluation: PACU Anesthesia Type: Regional Level of consciousness: awake and alert Pain management: pain level controlled Vital Signs Assessment: post-procedure vital signs reviewed and stable Respiratory status: spontaneous breathing Cardiovascular status: stable Anesthetic complications: no   No complications documented.  Last Vitals:  Vitals:   03/31/20 1245 03/31/20 1300  BP: 103/62 102/63  Pulse: (!) 56 63  Resp: 12 13  Temp:    SpO2: 100% 100%    Last Pain:  Vitals:   03/31/20 1215  PainSc: [place]

## 2020-03-31 NOTE — Plan of Care (Signed)
  Problem: Education: Goal: Knowledge of the prescribed therapeutic regimen will improve Outcome: Progressing Goal: Ability to verbalize activity precautions or restrictions will improve Outcome: Progressing Goal: Understanding of discharge needs will improve Outcome: Progressing   

## 2020-03-31 NOTE — Anesthesia Procedure Notes (Signed)
Anesthesia Regional Block: Adductor canal block   Pre-Anesthetic Checklist: ,, timeout performed, Correct Patient, Correct Site, Correct Laterality, Correct Procedure, Correct Position, site marked, Risks and benefits discussed, Surgical consent,  Pre-op evaluation,  Post-op pain management  Laterality: Left  Prep: chloraprep       Needles:  Injection technique: Single-shot  Needle Type: Stimiplex     Needle Length: 9cm  Needle Gauge: 21     Additional Needles:   Procedures:,,,, ultrasound used (permanent image in chart),,,,  Narrative:  Start time: 03/31/2020 7:16 AM End time: 03/31/2020 7:21 AM Injection made incrementally with aspirations every 5 mL.  Performed by: Personally  Anesthesiologist: Nolon Nations, MD  Additional Notes: BP cuff, EKG monitors applied. Sedation begun. Artery and nerve location verified with U/S and anesthetic injected incrementally, slowly, and after negative aspirations under direct u/s guidance. Good fascial /perineural spread. Tolerated well.

## 2020-03-31 NOTE — Interval H&P Note (Signed)
History and Physical Interval Note:  03/31/2020 10:09 AM  Dennis Macias  has presented today for surgery, with the diagnosis of ATHEROSCLEROSIS OF NATIVE ARTERIES OF THE EXTREMITIES WITH ULCERATION.  The various methods of treatment have been discussed with the patient and family. After consideration of risks, benefits and other options for treatment, the patient has consented to  Procedure(s) with comments: LEFT BELOW KNEE AMPUTATION (Left) - MAC anesthesia with regional block   WILL NEED DIALYSIS THURSDAY as a surgical intervention.  The patient's history has been reviewed, patient examined, no change in status, stable for surgery.  I have reviewed the patient's chart and labs.  Questions were answered to the patient's satisfaction.     [name]

## 2020-04-01 ENCOUNTER — Encounter (HOSPITAL_COMMUNITY): Payer: Self-pay | Admitting: Surgery

## 2020-04-01 DIAGNOSIS — N186 End stage renal disease: Secondary | ICD-10-CM | POA: Diagnosis present

## 2020-04-01 DIAGNOSIS — H547 Unspecified visual loss: Secondary | ICD-10-CM | POA: Diagnosis present

## 2020-04-01 DIAGNOSIS — E1122 Type 2 diabetes mellitus with diabetic chronic kidney disease: Secondary | ICD-10-CM | POA: Diagnosis present

## 2020-04-01 DIAGNOSIS — N2581 Secondary hyperparathyroidism of renal origin: Secondary | ICD-10-CM | POA: Diagnosis present

## 2020-04-01 DIAGNOSIS — T8789 Other complications of amputation stump: Secondary | ICD-10-CM | POA: Diagnosis not present

## 2020-04-01 DIAGNOSIS — Z8249 Family history of ischemic heart disease and other diseases of the circulatory system: Secondary | ICD-10-CM | POA: Diagnosis not present

## 2020-04-01 DIAGNOSIS — H409 Unspecified glaucoma: Secondary | ICD-10-CM | POA: Diagnosis present

## 2020-04-01 DIAGNOSIS — I252 Old myocardial infarction: Secondary | ICD-10-CM | POA: Diagnosis not present

## 2020-04-01 DIAGNOSIS — E785 Hyperlipidemia, unspecified: Secondary | ICD-10-CM | POA: Diagnosis present

## 2020-04-01 DIAGNOSIS — Z79899 Other long term (current) drug therapy: Secondary | ICD-10-CM | POA: Diagnosis not present

## 2020-04-01 DIAGNOSIS — I132 Hypertensive heart and chronic kidney disease with heart failure and with stage 5 chronic kidney disease, or end stage renal disease: Secondary | ICD-10-CM | POA: Diagnosis present

## 2020-04-01 DIAGNOSIS — Z7982 Long term (current) use of aspirin: Secondary | ICD-10-CM | POA: Diagnosis not present

## 2020-04-01 DIAGNOSIS — H9190 Unspecified hearing loss, unspecified ear: Secondary | ICD-10-CM | POA: Diagnosis present

## 2020-04-01 DIAGNOSIS — E1152 Type 2 diabetes mellitus with diabetic peripheral angiopathy with gangrene: Secondary | ICD-10-CM | POA: Diagnosis present

## 2020-04-01 DIAGNOSIS — R001 Bradycardia, unspecified: Secondary | ICD-10-CM

## 2020-04-01 DIAGNOSIS — I5042 Chronic combined systolic (congestive) and diastolic (congestive) heart failure: Secondary | ICD-10-CM | POA: Diagnosis present

## 2020-04-01 DIAGNOSIS — Z992 Dependence on renal dialysis: Secondary | ICD-10-CM | POA: Diagnosis not present

## 2020-04-01 DIAGNOSIS — I70222 Atherosclerosis of native arteries of extremities with rest pain, left leg: Secondary | ICD-10-CM | POA: Diagnosis present

## 2020-04-01 DIAGNOSIS — M79605 Pain in left leg: Secondary | ICD-10-CM | POA: Diagnosis not present

## 2020-04-01 DIAGNOSIS — E11319 Type 2 diabetes mellitus with unspecified diabetic retinopathy without macular edema: Secondary | ICD-10-CM | POA: Diagnosis present

## 2020-04-01 DIAGNOSIS — Z8673 Personal history of transient ischemic attack (TIA), and cerebral infarction without residual deficits: Secondary | ICD-10-CM | POA: Diagnosis not present

## 2020-04-01 DIAGNOSIS — I120 Hypertensive chronic kidney disease with stage 5 chronic kidney disease or end stage renal disease: Secondary | ICD-10-CM | POA: Diagnosis not present

## 2020-04-01 DIAGNOSIS — Z89512 Acquired absence of left leg below knee: Secondary | ICD-10-CM

## 2020-04-01 DIAGNOSIS — I951 Orthostatic hypotension: Secondary | ICD-10-CM

## 2020-04-01 DIAGNOSIS — I70262 Atherosclerosis of native arteries of extremities with gangrene, left leg: Secondary | ICD-10-CM

## 2020-04-01 DIAGNOSIS — Z87891 Personal history of nicotine dependence: Secondary | ICD-10-CM | POA: Diagnosis not present

## 2020-04-01 DIAGNOSIS — L97529 Non-pressure chronic ulcer of other part of left foot with unspecified severity: Secondary | ICD-10-CM | POA: Diagnosis present

## 2020-04-01 DIAGNOSIS — Z83438 Family history of other disorder of lipoprotein metabolism and other lipidemia: Secondary | ICD-10-CM | POA: Diagnosis not present

## 2020-04-01 DIAGNOSIS — Z20822 Contact with and (suspected) exposure to covid-19: Secondary | ICD-10-CM | POA: Diagnosis present

## 2020-04-01 DIAGNOSIS — Z7901 Long term (current) use of anticoagulants: Secondary | ICD-10-CM

## 2020-04-01 DIAGNOSIS — M1990 Unspecified osteoarthritis, unspecified site: Secondary | ICD-10-CM | POA: Diagnosis present

## 2020-04-01 DIAGNOSIS — E11621 Type 2 diabetes mellitus with foot ulcer: Secondary | ICD-10-CM | POA: Diagnosis present

## 2020-04-01 LAB — BASIC METABOLIC PANEL
Anion gap: 10 (ref 5–15)
BUN: 13 mg/dL (ref 8–23)
CO2: 30 mmol/L (ref 22–32)
Calcium: 8.2 mg/dL — ABNORMAL LOW (ref 8.9–10.3)
Chloride: 97 mmol/L — ABNORMAL LOW (ref 98–111)
Creatinine, Ser: 2.53 mg/dL — ABNORMAL HIGH (ref 0.61–1.24)
GFR calc Af Amer: 29 mL/min — ABNORMAL LOW (ref >60)
GFR calc non Af Amer: 25 mL/min — ABNORMAL LOW (ref >60)
Glucose, Bld: 147 mg/dL — ABNORMAL HIGH (ref 70–99)
Potassium: 3.8 mmol/L (ref 3.5–5.1)
Sodium: 137 mmol/L (ref 135–145)

## 2020-04-01 LAB — PREPARE RBC (CROSSMATCH)

## 2020-04-01 LAB — CBC
HCT: 23.8 % — ABNORMAL LOW (ref 39.0–52.0)
Hemoglobin: 7 g/dL — ABNORMAL LOW (ref 13.0–17.0)
MCH: 26.3 pg (ref 26.0–34.0)
MCHC: 29.4 g/dL — ABNORMAL LOW (ref 30.0–36.0)
MCV: 89.5 fL (ref 80.0–100.0)
Platelets: 282 10*3/uL (ref 150–400)
RBC: 2.66 MIL/uL — ABNORMAL LOW (ref 4.22–5.81)
RDW: 15 % (ref 11.5–15.5)
WBC: 10.5 10*3/uL (ref 4.0–10.5)
nRBC: 0 % (ref 0.0–0.2)

## 2020-04-01 LAB — SURGICAL PATHOLOGY

## 2020-04-01 MED ORDER — NEPRO/CARBSTEADY PO LIQD
237.0000 mL | Freq: Two times a day (BID) | ORAL | Status: DC
  Administered 2020-04-01 – 2020-04-06 (×10): 237 mL via ORAL

## 2020-04-01 MED ORDER — SODIUM CHLORIDE 0.9% IV SOLUTION: Freq: Once | INTRAVENOUS | Status: AC

## 2020-04-01 MED ORDER — OXYCODONE HCL 5 MG PO TABS
10.0000 mg | ORAL_TABLET | ORAL | Status: DC | PRN
  Administered 2020-04-01 – 2020-04-05 (×6): 10 mg via ORAL
  Filled 2020-04-01 (×5): qty 2

## 2020-04-01 MED ORDER — CHLORHEXIDINE GLUCONATE CLOTH 2 % EX PADS
6.0000 | MEDICATED_PAD | Freq: Every day | CUTANEOUS | Status: DC
  Administered 2020-04-01 – 2020-04-04 (×3): 6 via TOPICAL

## 2020-04-01 NOTE — Progress Notes (Signed)
PT Cancellation Note  Patient Details Name: Dennis Macias MRN: [id number] DOB: 02-03-1951   Cancelled Treatment:    Reason Eval/Treat Not Completed: Pain limiting ability to participate Pt refusing therapy evaluation secondary to pain. RN notified.   Wyona Almas, PT, DPT Acute Rehabilitation Services Pager [phone] Office [phone]    Deno Etienne 04/01/2020, 4:57 PM

## 2020-04-01 NOTE — Progress Notes (Addendum)
[facility]   Subjective  - No new complaints, pain controlled.   Objective 133/73 76 99.7 F (37.6 C) (Oral) 18 97%  Intake/Output Summary (Last 24 hours) at 04/01/2020 0710 Last data filed at 03/31/2020 1751 Gross per 24 hour  Intake 750 ml  Output 1400 ml  Net -650 ml    Left BKA dressing clean and dry Lungs non labored breathing   Assessment/Planning: POD # 1 left BKA  Plan for dressing change tomorrow Jodell Cipro will remain for 4 weeks.  Pending discharge disposition.  Roxy Horseman 04/01/2020 7:10 AM --  Laboratory Lab Results: Recent Labs    03/31/20 0547 03/31/20 0547 03/31/20 0945 04/01/20 0348  WBC 13.4*  --   --  10.5  HGB 9.1*   < > 8.8* 7.0*  HCT 31.4*   < > 26.0* 23.8*  PLT 447*  --   --  282   < > = values in this interval not displayed.   BMET Recent Labs    03/31/20 0545 03/31/20 0545 03/31/20 0945 04/01/20 0348  NA 135   < > 136 137  K 3.6   < > 4.2 3.8  CL 95*   < > 96* 97*  CO2 28  --   --  30  GLUCOSE 185*   < > 194* 147*  BUN 23   < > 23 13  CREATININE 3.80*   < > 3.50* 2.53*  CALCIUM 8.7*  --   --  8.2*   < > = values in this interval not displayed.    COAG Lab Results  Component Value Date   INR 1.6 (H) 03/31/2020   INR 1.5 (H) 02/17/2020   INR 1.9 (H) 01/14/2020   No results found for: PTT  I agree with the above.  I have seen and evaluated patient.  He is postoperative day #1 from a left below-knee amputation.  He is complaining of incisional pain as well as phantom pain.  He is requesting pain medicine and so I have ordered oxycodone.  We will plan on changing his dressing tomorrow.  Hopefully he will be able to go to inpatient rehab.  [name]

## 2020-04-01 NOTE — Progress Notes (Addendum)
Subjective: Patinet had no overnight events. Patient doing well this AM is having some minor pain at his amputation site but otherwise does not report  Chest pain nor SOB.  Objective:  Vital signs in last 24 hours: Vitals:   03/31/20 1848 03/31/20 2013 04/01/20 0514 04/01/20 0812  BP: (!) 116/58 128/76 133/73 122/68  Pulse: 79 70 76 67  Resp: 16  18 17   Temp: 98.7 F (37.1 C) 97.8 F (36.6 C) 99.7 F (37.6 C) 99.8 F (37.7 C)  TempSrc: Oral Oral Oral Oral  SpO2: 97%   95%  Weight:      Height:       Physical Exam Cardiovascular:     Rate and Rhythm: Regular rhythm. Bradycardia present.     Heart sounds: Normal heart sounds.  Pulmonary:     Effort: Pulmonary effort is normal.  Abdominal:     General: Bowel sounds are normal.     Palpations: Abdomen is soft. There is no mass.     Tenderness: There is no abdominal tenderness.  Skin:    General: Skin is warm and dry.  Neurological:     Mental Status: He is alert.     Comments: Patient has poor attention. Girlfriend was in room and would often jump in when team tried to communicate with patient.     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 Admitted following procedure.  Left lower extremity PAD with gangrene s/p left BKA: Wound is wrapped in bandage, no obvious bleeding noted.  Slight pain at sight of BKA, has Dilaudid as needed for pain control. Staples will remain for 4 weeks and patient will receive dressing change tomorrow. -Vascular following, appreciate recommendations -Pain control with dilaudid PRN -Continue  atorvastatin 80 mg daily -Continue home ASA -F/u vascular recommendations for resumption of Plavix - PT/OT   ESRD on HD TTS: Last dialysis session on 9/23, not volume overloaded on exam. No significant electrolyte abnormalities. Consulting nephrology for resumption of dialysis. Hgb 7.1 this AM. Patient is normotensive, stable, has decent appetite, no dizziness or nausea. -F/u H&H this PM, may need transfusion with HD tom. -Nephrology consulted, appreciate assistance -Monitor renal function  Prior CVA: Patient currently on Plavix daily for this, no residual deficits noted.  Follows with neurology, last seen on 9/13, no changes noted at that time.  -home ASA -Holding home Plavix -home atorvastatin 80 mg daily  History of hypertension: History of orthostatic hypotension: Patient had developed acute CVA secondary to hypotension.  Goal blood pressure less than 130/90.  Currently on any blood pressure medications, will continue to monitor.  DM: Diet controlled, last A1c was 6.4.  Will monitor CBGs.  Prior to Admission Living Arrangement: Home Anticipated Discharge Location: SNF? Vs HHPT Barriers to Discharge: Treatment and Dispo plan Dispo: Anticipated discharge in approximately 1-2 day(s).   Freida Busman, MD 04/01/2020, 9:07 AM Pager: [phone] After 5pm on weekdays and 1pm on weekends: On Call pager [phone]   Internal Medicine Attending:   I saw and examined the patient. I reviewed the resident's note and I  agree with the resident's findings and plan as documented in the resident's note.  Lalla Brothers, MD

## 2020-04-01 NOTE — Progress Notes (Signed)
Patient PM Hgb 6.4. Transfused 1UpRBC's.

## 2020-04-01 NOTE — Progress Notes (Signed)
[facility] Progress Note   Dialysis Orders: [place] T,Th,S 4 hrs 180NRe 400/600 68.5 kg 2.0 K/2.25 Ca RIJ TDC/R AVF -Heparin 2000 units IV TIW -Hectorol 2 mcg IV TIW -Venofer 100 mg IV X 10 doses (1/10 doses given 1st dose 03/29/2020) -Venofer 50 mg IV weekly when Fe load completed -Mircera 150 mcg IV q 2 weeks (Last 03/23/2020)  Assessment/Plan: 1.  LLE gangrene/PAD S/P L BKA  Dr. Trula Slade 03/31/2020. Per primary/VVS.  2.  ESRD - TTS via TDC. Next HD Tuesday  3.  Hx orthostatic hypotension/HTN/volume  BP controlled - BP ok net UF 1 L -post wt 65.4 - lower edw for d/c  4.  Anemia  - HGB 9.1 > 7 post op - d/w prior - transfuse on HD tomorrow - if < 6.8 tomorrow - will give 2 instead of 1 unit PRBC  Recent ESA dose. Ok to resume IV Fe post BKA but since getting Transfusion will defer resuming IV Fe until next Tuesday 5.  Metabolic bone disease - OP BMD labs at goal. Continue binders, VDRA.  6.  Nutrition - Renal/Carb mod diet. Albumin low. Add protein supps, renal vits.  7. DMT2-per primary 8. Hx CVA  Dennis Jacobson, PA-C [place] [phone] 04/01/2020,9:24 AM  LOS: 1 day   Subjective:   Denies any pain.   Objective Vitals:   03/31/20 1848 03/31/20 2013 04/01/20 0514 04/01/20 0812  BP: (!) 116/58 128/76 133/73 122/68  Pulse: 79 70 76 67  Resp: 16  18 17   Temp: 98.7 F (37.1 C) 97.8 F (36.6 C) 99.7 F (37.6 C) 99.8 F (37.7 C)  TempSrc: Oral Oral Oral Oral  SpO2: 97%   95%  Weight:      Height:       Physical Exam General: frail older male NAD Heart:  Lungs: no rales Abdomen: soft NT Extremities: left BKA wrapped - RLE no edema Dialysis Access: right IJ [facility] and maturing right AVF    Additional Objective Labs: Basic Metabolic Panel: Recent Labs  Lab 03/31/20 0545 03/31/20 0945 04/01/20 0348  NA 135 136 137  K 3.6 4.2 3.8  CL 95* 96* 97*  CO2 28  --  30  GLUCOSE 185* 194* 147*  BUN 23 23 13   CREATININE 3.80*  3.50* 2.53*  CALCIUM 8.7*  --  8.2*   Liver Function Tests: Recent Labs  Lab 03/31/20 0545  AST 12*  ALT 7  ALKPHOS 72  BILITOT 0.6  PROT 6.6  ALBUMIN 2.2*   No results for input(s): LIPASE, AMYLASE in the last 168 hours. CBC: Recent Labs  Lab 03/31/20 0547 03/31/20 0945 04/01/20 0348  WBC 13.4*  --  10.5  HGB 9.1* 8.8* 7.0*  HCT 31.4* 26.0* 23.8*  MCV 90.5  --  89.5  PLT 447*  --  282   CBG: Recent Labs  Lab 03/31/20 0600 03/31/20 1014 03/31/20 1358  GLUCAP 174* 177* 162*   Iron Studies: No results for input(s): IRON, TIBC, TRANSFERRIN, FERRITIN in the last 72 hours. Lab Results  Component Value Date   INR 1.6 (H) 03/31/2020   INR 1.5 (H) 02/17/2020   INR 1.9 (H) 01/14/2020   Studies/Results: No results found. Medications: . sodium chloride 10 mL/hr at 03/31/20 0735   . aspirin EC  81 mg Oral Daily  . atorvastatin  80 mg Oral QHS  . Chlorhexidine Gluconate Cloth  6 each Topical Once   And  . Chlorhexidine Gluconate Cloth  6  each Topical Once  . Chlorhexidine Gluconate Cloth  6 each Topical Q0600  . [START ON 04/02/2020] doxercalciferol  2 mcg Intravenous Q T,Th,Sa-HD  . feeding supplement (PROSource TF)  45 mL Per Tube BID  . finasteride  5 mg Oral Daily  . multivitamin  1 tablet Oral QHS  . sevelamer carbonate  800 mg Oral TID WC

## 2020-04-01 NOTE — Evaluation (Signed)
Occupational Therapy Evaluation Patient Details Name: Dennis Macias MRN: [id number] DOB: 13-Feb-1951 Today's Date: 04/01/2020    History of Present Illness This is a 69 year old male with a history of ESRD on HD TTS, HTN, CVA, DM, and PAD s/p failed revascularization who continued to have deterioration of his wound, vascular brought into the hospital for a left below knee amputation.   Clinical Impression   PTA, pt lives with girlfriend and reports Modified Independent with ADLs and mobility using RW or cane. Pt presents now s/p L BKA with deficits in sitting balance, strength, endurance, cognition. Pt overall max A for bed mobility, unable to gain sitting balance despite repositioning requiring Max A from therapist to maintain. Due to poor sitting balance, unable to attempt OOB transfer with 1 person assist today. Due to deficits, pt requires Mod A for UB ADLs and Total A for LB ADLs. Recommend SNF for short term rehab prior to return home.     Follow Up Recommendations  SNF;Supervision/Assistance - 24 hour    Equipment Recommendations  Other (comment) (TBD; appears well equipped)    Recommendations for Other Services       Precautions / Restrictions Precautions Precautions: Fall Restrictions Weight Bearing Restrictions: Yes Other Position/Activity Restrictions: NWB, L BKA      Mobility Bed Mobility Overal bed mobility: Needs Assistance Bed Mobility: Supine to Sit;Sit to Sidelying     Supine to sit: Max assist;HOB elevated   Sit to sidelying: Mod assist General bed mobility comments: Max A to sit EOB, extreme difficulty scooting hips forward and required cues for sequening throughout. Pt Mod A for sitting to sidelying to supine. Able to bring B LE up without as much assist  Transfers                 General transfer comment: deferred for safety due to poor sitting balance EOB    Balance Overall balance assessment: Needs assistance Sitting-balance support: Bilateral  upper extremity supported;Feet supported Sitting balance-Leahy Scale: Zero Sitting balance - Comments: Pt required Max A to maintain sitting balance. Repositioned hips and placement of B hands with continued leaning forward and pt unable to correct balance without external support from therapist Postural control: Other (comment);Left lateral lean (forward lean)                                 ADL either performed or assessed with clinical judgement   ADL Overall ADL's : Needs assistance/impaired Eating/Feeding: Set up;Sitting;Bed level   Grooming: Minimal assistance;Sitting   Upper Body Bathing: Moderate assistance;Sitting   Lower Body Bathing: Total assistance;Sit to/from stand;Sitting/lateral leans;Bed level   Upper Body Dressing : Moderate assistance;Sitting Upper Body Dressing Details (indicate cue type and reason): Mod A for mgmt of gown due to decreased ability to maintain balance sitting EOB Lower Body Dressing: Total assistance;Bed level Lower Body Dressing Details (indicate cue type and reason): Total A for donning L sock     Toileting- Clothing Manipulation and Hygiene: Total assistance;Bed level         General ADL Comments: Pt with decreased sitting balance, strength, endurance, pain and cognition (regarding safety, awareness of deficits, and problem solving)     Vision Baseline Vision/History: Wears glasses Wears Glasses: Reading only Patient Visual Report: No change from baseline Vision Assessment?: No apparent visual deficits     Perception     Praxis      Pertinent Vitals/Pain Pain Assessment:  0-10 Pain Score: 5  Pain Location: L LE Pain Descriptors / Indicators: Grimacing;Guarding Pain Intervention(s): Monitored during session;Limited activity within patient's tolerance     Hand Dominance Right   Extremity/Trunk Assessment Upper Extremity Assessment Upper Extremity Assessment: Generalized weakness   Lower Extremity  Assessment Lower Extremity Assessment: Defer to PT evaluation   Cervical / Trunk Assessment Cervical / Trunk Assessment: Kyphotic   Communication Communication Communication: HOH   Cognition Arousal/Alertness: Awake/alert Behavior During Therapy: WFL for tasks assessed/performed Overall Cognitive Status: No family/caregiver present to determine baseline cognitive functioning Area of Impairment: Safety/judgement;Awareness;Problem solving                         Safety/Judgement: Decreased awareness of safety;Decreased awareness of deficits Awareness: Emergent Problem Solving: Difficulty sequencing;Requires verbal cues;Requires tactile cues General Comments: Pt with appropriate responses with conversation, some decreased awareness of deficits. Pt reporting "im going to need my son to help me at home" though unable to progress OOB today   General Comments  Pt VSS on RA.     Exercises     Shoulder Instructions      Home Living Family/patient expects to be discharged to:: Private residence Living Arrangements: Spouse/significant other Available Help at Discharge: Family;Available PRN/intermittently Type of Home: Apartment Home Access: Stairs to enter Entrance Stairs-Number of Steps: 6 Entrance Stairs-Rails: Right Home Layout: One level     Bathroom Shower/Tub: Teacher, early years/pre: Standard Bathroom Accessibility: Yes   Home Equipment: Bedside commode;Wheelchair - [employer] - 2 wheels;Walker - 4 wheels;Tub bench;Cane - single point   Additional Comments: Girlfriend and son work part time      Prior Functioning/Environment Level of Independence: Needs Product/process development scientist / Transfers Assistance Needed: usually ambulates with walker or cane, limited community distances ADL's / Homemaking Assistance Needed: independent with dressing and bathing, girlfriend does cooking and cleaning   Comments: enjoys going outside and sitting        OT Problem  List: Decreased strength;Decreased activity tolerance;Impaired balance (sitting and/or standing);Decreased coordination;Decreased cognition;Decreased safety awareness;Decreased knowledge of use of DME or AE;Pain      OT Treatment/Interventions: Self-care/ADL training;Therapeutic exercise;DME and/or AE instruction;Energy conservation;Therapeutic activities;Patient/family education;Neuromuscular education;Balance training    OT Goals(Current goals can be found in the care plan section) Acute Rehab OT Goals Patient Stated Goal: be able to go home OT Goal Formulation: With patient Time For Goal Achievement: 04/15/20 Potential to Achieve Goals: Good ADL Goals Pt Will Perform Lower Body Bathing: with min assist;sit to/from stand;sitting/lateral leans Pt Will Perform Lower Body Dressing: with min assist;sitting/lateral leans;sit to/from stand Pt Will Transfer to Toilet: with mod assist;stand pivot transfer;bedside commode Pt Will Perform Toileting - Clothing Manipulation and hygiene: with min assist;sit to/from stand;sitting/lateral leans Pt/caregiver will Perform Home Exercise Program: Increased strength;With theraband;Both right and left upper extremity;With written HEP provided;With Supervision Additional ADL Goal #1: Pt to demonstrate ability to sit EOB for >3 min with no more than min guard assist to maintain sitting balance  OT Frequency: Min 2X/week   Barriers to D/C:            Co-evaluation              AM-PAC OT "6 Clicks" Daily Activity     Outcome Measure Help from another person eating meals?: A Little Help from another person taking care of personal grooming?: A Little Help from another person toileting, which includes using toliet, bedpan, or urinal?: Total Help from another  person bathing (including washing, rinsing, drying)?: Total Help from another person to put on and taking off regular upper body clothing?: A Lot Help from another person to put on and taking off  regular lower body clothing?: Total 6 Click Score: 11   End of Session Nurse Communication: Mobility status  Activity Tolerance: Patient limited by fatigue;Other (comment) (by decreased sitting balance) Patient left: in bed;with call bell/phone within reach;with bed alarm set  OT Visit Diagnosis: Unsteadiness on feet (R26.81);Other abnormalities of gait and mobility (R26.89);Muscle weakness (generalized) (M62.81);Pain;Other symptoms and signs involving cognitive function Pain - Right/Left: Left Pain - part of body: Leg                Time: 1036-1105 OT Time Calculation (min): 29 min Charges:  OT General Charges $OT Visit: 1 Visit OT Evaluation $OT Eval Moderate Complexity: 1 Mod OT Treatments $Therapeutic Activity: 8-22 mins  Layla Maw, OTR/L  Layla Maw 04/01/2020, 12:51 PM

## 2020-04-01 NOTE — Plan of Care (Signed)
  Problem: Education: Goal: Knowledge of the prescribed therapeutic regimen will improve Outcome: Progressing Goal: Ability to verbalize activity precautions or restrictions will improve Outcome: Progressing Goal: Understanding of discharge needs will improve Outcome: Progressing   

## 2020-04-02 ENCOUNTER — Encounter: Payer: Self-pay | Admitting: Internal Medicine

## 2020-04-02 ENCOUNTER — Other Ambulatory Visit: Payer: Self-pay | Admitting: Vascular Surgery

## 2020-04-02 LAB — CBC WITH DIFFERENTIAL/PLATELET
Abs Immature Granulocytes: 0.07 10*3/uL (ref 0.00–0.07)
Basophils Absolute: 0 10*3/uL (ref 0.0–0.1)
Basophils Relative: 0 %
Eosinophils Absolute: 0 10*3/uL (ref 0.0–0.5)
Eosinophils Relative: 0 %
HCT: 25.1 % — ABNORMAL LOW (ref 39.0–52.0)
Hemoglobin: 7.7 g/dL — ABNORMAL LOW (ref 13.0–17.0)
Immature Granulocytes: 1 %
Lymphocytes Relative: 12 %
Lymphs Abs: 1.6 10*3/uL (ref 0.7–4.0)
MCH: 26.7 pg (ref 26.0–34.0)
MCHC: 30.7 g/dL (ref 30.0–36.0)
MCV: 87.2 fL (ref 80.0–100.0)
Monocytes Absolute: 1 10*3/uL (ref 0.1–1.0)
Monocytes Relative: 8 %
Neutro Abs: 10.5 10*3/uL — ABNORMAL HIGH (ref 1.7–7.7)
Neutrophils Relative %: 79 %
Platelets: 272 10*3/uL (ref 150–400)
RBC: 2.88 MIL/uL — ABNORMAL LOW (ref 4.22–5.81)
RDW: 15.9 % — ABNORMAL HIGH (ref 11.5–15.5)
WBC: 13.2 10*3/uL — ABNORMAL HIGH (ref 4.0–10.5)
nRBC: 0 % (ref 0.0–0.2)

## 2020-04-02 LAB — TYPE AND SCREEN
ABO/RH(D): AB POS
Antibody Screen: NEGATIVE
Unit division: 0

## 2020-04-02 LAB — BASIC METABOLIC PANEL
Anion gap: 10 (ref 5–15)
BUN: 26 mg/dL — ABNORMAL HIGH (ref 8–23)
CO2: 25 mmol/L (ref 22–32)
Calcium: 8.3 mg/dL — ABNORMAL LOW (ref 8.9–10.3)
Chloride: 100 mmol/L (ref 98–111)
Creatinine, Ser: 3.79 mg/dL — ABNORMAL HIGH (ref 0.61–1.24)
GFR calc Af Amer: 18 mL/min — ABNORMAL LOW (ref >60)
GFR calc non Af Amer: 15 mL/min — ABNORMAL LOW (ref >60)
Glucose, Bld: 182 mg/dL — ABNORMAL HIGH (ref 70–99)
Potassium: 3.8 mmol/L (ref 3.5–5.1)
Sodium: 135 mmol/L (ref 135–145)

## 2020-04-02 LAB — BPAM RBC
Blood Product Expiration Date: 202110222359
ISSUE DATE / TIME: 202109241533
Unit Type and Rh: 6200

## 2020-04-02 LAB — RETICULOCYTES
Immature Retic Fract: 20.5 % — ABNORMAL HIGH (ref 2.3–15.9)
RBC.: 2.83 MIL/uL — ABNORMAL LOW (ref 4.22–5.81)
Retic Count, Absolute: 80.4 10*3/uL (ref 19.0–186.0)
Retic Ct Pct: 2.8 % (ref 0.4–3.1)

## 2020-04-02 LAB — FERRITIN: Ferritin: 302 ng/mL (ref 24–336)

## 2020-04-02 LAB — HEMOGLOBIN AND HEMATOCRIT, BLOOD
HCT: 21.4 % — ABNORMAL LOW (ref 39.0–52.0)
Hemoglobin: 6.4 g/dL — CL (ref 13.0–17.0)

## 2020-04-02 LAB — GLUCOSE, CAPILLARY: Glucose-Capillary: 154 mg/dL — ABNORMAL HIGH (ref 70–99)

## 2020-04-02 MED ORDER — INSULIN ASPART 100 UNIT/ML ~~LOC~~ SOLN
0.0000 [IU] | SUBCUTANEOUS | Status: DC
  Administered 2020-04-02: 2 [IU] via SUBCUTANEOUS
  Administered 2020-04-03: 1 [IU] via SUBCUTANEOUS
  Administered 2020-04-03: 2 [IU] via SUBCUTANEOUS
  Administered 2020-04-03: 1 [IU] via SUBCUTANEOUS
  Administered 2020-04-04: 2 [IU] via SUBCUTANEOUS
  Administered 2020-04-04: 1 [IU] via SUBCUTANEOUS
  Administered 2020-04-05 (×3): 2 [IU] via SUBCUTANEOUS
  Administered 2020-04-06: 1 [IU] via SUBCUTANEOUS
  Administered 2020-04-06 (×2): 2 [IU] via SUBCUTANEOUS
  Administered 2020-04-06: 1 [IU] via SUBCUTANEOUS
  Administered 2020-04-06: 2 [IU] via SUBCUTANEOUS

## 2020-04-02 MED ORDER — HEPARIN SODIUM (PORCINE) 5000 UNIT/ML IJ SOLN
5000.0000 [IU] | Freq: Three times a day (TID) | INTRAMUSCULAR | Status: DC
  Administered 2020-04-02 – 2020-04-06 (×11): 5000 [IU] via SUBCUTANEOUS
  Filled 2020-04-02 (×9): qty 1

## 2020-04-02 MED ORDER — HEPARIN SODIUM (PORCINE) 5000 UNIT/ML IJ SOLN
5000.0000 [IU] | Freq: Three times a day (TID) | INTRAMUSCULAR | Status: DC
  Administered 2020-04-02: 5000 [IU] via SUBCUTANEOUS
  Filled 2020-04-02 (×2): qty 1

## 2020-04-02 MED ORDER — SODIUM CHLORIDE 0.9 % IV SOLN
125.0000 mg | INTRAVENOUS | Status: DC
  Filled 2020-04-02: qty 10

## 2020-04-02 MED ORDER — SODIUM CHLORIDE 0.9 % IV SOLN
125.0000 mg | INTRAVENOUS | Status: DC
  Administered 2020-04-05: 125 mg via INTRAVENOUS
  Filled 2020-04-02 (×2): qty 10

## 2020-04-02 MED ORDER — DARBEPOETIN ALFA 150 MCG/0.3ML IJ SOSY
150.0000 ug | PREFILLED_SYRINGE | INTRAMUSCULAR | Status: DC
  Filled 2020-04-02: qty 0.3

## 2020-04-02 NOTE — Progress Notes (Signed)
[facility] Progress Note   Dialysis Orders: [place] T,Th,S 4 hrs 180NRe 400/600 68.5 kg 2.0 K/2.25 Ca RIJ TDC/R AVF -Heparin 2000 units IV TIW -Hectorol 2 mcg IV TIW -Venofer 100 mg IV X 10 doses (1/10 doses given 1st dose 03/29/2020) -Venofer 50 mg IV weekly when Fe load completed -Mircera 150 mcg IV q 2 weeks (Last 03/23/2020)  Assessment/Plan: 1.  LLE gangrene/PAD S/P L BKA  Dr. Trula Slade 03/31/2020. Per primary/VVS.  2.  ESRD - TTS via TDC. Next HD today K 3.8 use 4 K bath- may need to postpone to Sunday due to staffing issues 3.  Hx orthostatic hypotension/HTN/volume  BP controlled - BP ok net UF 1 L -post wt 65.4 - lower edw for d/c  4.  Anemia  - HGB 9.1 > 7 > 7.7 after 1 unit PRBC 9/24 Continue ESA. Ok to resume IV Fe post BKA 5.  Metabolic bone disease - OP BMD labs at goal. Continue binders, VDRA.  6.  Nutrition - Renal/Carb mod diet. Albumin low. Add protein supps, renal vits. High risk for inadequate intake  7. DMT2-per primary 8. Hx CVA  Myriam Jacobson, PA-C [facility] [phone] 04/02/2020,8:09 AM  LOS: 2 days   Subjective:   Eating little - assisted and encouraged by cutting up foods.  Objective Vitals:   04/01/20 1845 04/01/20 1900 04/02/20 0500 04/02/20 0722  BP: 127/64 127/66 128/68 (!) 141/72  Pulse: 78 76 75 76  Resp: 18 17 17 17   Temp: 99.3 F (37.4 C) 99.7 F (37.6 C) 99.5 F (37.5 C) 99.2 F (37.3 C)  TempSrc: Oral Oral Oral Oral  SpO2: 98% 99% 99% 92%  Weight:      Height:       Physical Exam General: frail older male NAD Heart: RRR Lungs: no rales Abdomen: soft NT Extremities: left BKA wrapped - RLE no edema Dialysis Access: right IJ [facility] - [facility] and maturing right AVF    Additional Objective Labs: Basic Metabolic Panel: Recent Labs  Lab 03/31/20 0545 03/31/20 0545 03/31/20 0945 04/01/20 0348 04/02/20 0323  NA 135   < > 136 137 135  K 3.6   < > 4.2 3.8 3.8  CL 95*   < > 96* 97* 100  CO2 28  --    --  30 25  GLUCOSE 185*   < > 194* 147* 182*  BUN 23   < > 23 13 26*  CREATININE 3.80*   < > 3.50* 2.53* 3.79*  CALCIUM 8.7*  --   --  8.2* 8.3*   < > = values in this interval not displayed.   Liver Function Tests: Recent Labs  Lab 03/31/20 0545  AST 12*  ALT 7  ALKPHOS 72  BILITOT 0.6  PROT 6.6  ALBUMIN 2.2*   No results for input(s): LIPASE, AMYLASE in the last 168 hours. CBC: Recent Labs  Lab 03/31/20 0547 03/31/20 0945 04/01/20 0348 04/01/20 1214 04/02/20 0323  WBC 13.4*  --  10.5  --  13.2*  NEUTROABS  --   --   --   --  10.5*  HGB 9.1*   < > 7.0* 6.4* 7.7*  HCT 31.4*   < > 23.8* 21.4* 25.1*  MCV 90.5  --  89.5  --  87.2  PLT 447*  --  282  --  272   < > = values in this interval not displayed.   CBG: Recent Labs  Lab 03/31/20 0600 03/31/20 1014 03/31/20  1358  GLUCAP 174* 177* 162*   Iron Studies: No results for input(s): IRON, TIBC, TRANSFERRIN, FERRITIN in the last 72 hours. Lab Results  Component Value Date   INR 1.6 (H) 03/31/2020   INR 1.5 (H) 02/17/2020   INR 1.9 (H) 01/14/2020   Studies/Results: No results found. Medications: . sodium chloride 10 mL/hr at 03/31/20 0735   . aspirin EC  81 mg Oral Daily  . atorvastatin  80 mg Oral QHS  . Chlorhexidine Gluconate Cloth  6 each Topical Q0600  . doxercalciferol  2 mcg Intravenous Q T,Th,Sa-HD  . feeding supplement (NEPRO CARB STEADY)  237 mL Oral BID BM  . feeding supplement (PROSource TF)  45 mL Per Tube BID  . finasteride  5 mg Oral Daily  . heparin  5,000 Units Subcutaneous Q8H  . multivitamin  1 tablet Oral QHS  . sevelamer carbonate  800 mg Oral TID WC

## 2020-04-02 NOTE — Evaluation (Signed)
Physical Therapy Evaluation Patient Details Name: Dennis Macias MRN: [id number] DOB: 28-Apr-1951 Today's Date: 04/02/2020   History of Present Illness  This is a 69 year old male with a history of ESRD on HD TTS, HTN, CVA, DM, and PAD s/p failed revascularization who continued to have deterioration of his wound, vascular brought into the hospital for a left below knee amputation.  Clinical Impression  Pt in bed upon arrival of PT, agreeable to evaluation at this time. Prior to admission the pt was mobilizing with use of a RW or cane at home where he lives with a girlfriend who has been assisting with home-making and some ADLs as needed. The pt now presents with limitations in functional mobility, power, strength, stability, and activity tolerance due to above dx and chronically reduced activity, and will continue to benefit from skilled PT to address these deficits. The pt requires mod/maxA to complete all bed mobility at this time, and maxA to stand from an elevated EOB. The pt demos poor safety awareness and problem solving, which further compound mobility deficits related to poor strength and stability. The pt will require continued inpatient therapies to progress strength, transfers, and mobility prior to return home.      Follow Up Recommendations SNF;Supervision/Assistance - 24 hour    Equipment Recommendations   (defer to post acute)    Recommendations for Other Services       Precautions / Restrictions Precautions Precautions: Fall Precaution Comments: new L BKA Restrictions Weight Bearing Restrictions: Yes LLE Weight Bearing: Non weight bearing Other Position/Activity Restrictions: NWB, L BKA      Mobility  Bed Mobility Overal bed mobility: Needs Assistance Bed Mobility: Supine to Sit;Sit to Supine     Supine to sit: Max assist;HOB elevated Sit to supine: Mod assist   General bed mobility comments: Max A to sit EOB, extreme difficulty scooting hips forward and required  cues for sequening throughout. Pt Mod A for sitting to sidelying to supine. Able to bring B LE up without as much assist  Transfers Overall transfer level: Needs assistance Equipment used: Rolling walker (2 wheeled) Transfers: Sit to/from Stand;Lateral/Scoot Transfers Sit to Stand: Max assist;From elevated surface        Lateral/Scoot Transfers: Mod assist General transfer comment: maxA on multiple attempts, pt eventually able to stand with maxA and RW to maintain baalnce on RLE. Able to take small hops to reposision RLE but fatigued after ~10 seconds of standing. Then lateral scoot to [facility] with modA  Ambulation/Gait - pt unable at this time                      Balance Overall balance assessment: Needs assistance Sitting-balance support: Bilateral upper extremity supported;Feet supported Sitting balance-Leahy Scale: Zero Sitting balance - Comments: Pt required minA to modA to maintain sitting balance. Repositioned hips and placement of B hands , but pt continued to have posterior and R lateral LOB in seated position without assist from PT Postural control: Posterior lean;Right lateral lean Standing balance support: Bilateral upper extremity supported;During functional activity Standing balance-Leahy Scale: Zero Standing balance comment: maxA and RW                             Pertinent Vitals/Pain Pain Assessment: Faces Faces Pain Scale: Hurts little more Pain Location: L LE Pain Descriptors / Indicators: Grimacing;Guarding Pain Intervention(s): Limited activity within patient's tolerance;Monitored during session;Repositioned    Home Living Family/patient expects  to be discharged to:: Private residence Living Arrangements: Spouse/significant other Available Help at Discharge: Family;Available PRN/intermittently Type of Home: Apartment Home Access: Stairs to enter Entrance Stairs-Rails: Right Entrance Stairs-Number of Steps: 6 Home Layout: One level Home  Equipment: Bedside commode;Wheelchair - [employer] - 2 wheels;Walker - 4 wheels;Tub bench;Cane - single point Additional Comments: Girlfriend and son work part time    Prior Function Level of Independence: Needs Water engineer / Transfers Assistance Needed: usually ambulates with walker or cane, limited community distances  ADL's / Homemaking Assistance Needed: independent with dressing and bathing, girlfriend does cooking and cleaning  Comments: enjoys going outside and sitting     Hand Dominance   Dominant Hand: Right    Extremity/Trunk Assessment   Upper Extremity Assessment Upper Extremity Assessment: Generalized weakness    Lower Extremity Assessment Lower Extremity Assessment: Generalized weakness;LLE deficits/detail LLE Deficits / Details: new BKA, NWB    Cervical / Trunk Assessment Cervical / Trunk Assessment: Kyphotic  Communication   Communication: HOH  Cognition Arousal/Alertness: Awake/alert Behavior During Therapy: Flat affect Overall Cognitive Status: No family/caregiver present to determine baseline cognitive functioning Area of Impairment: Safety/judgement;Awareness;Problem solving;Following commands                       Following Commands: Follows one step commands inconsistently;Follows multi-step commands inconsistently Safety/Judgement: Decreased awareness of safety;Decreased awareness of deficits Awareness: Emergent Problem Solving: Difficulty sequencing;Requires verbal cues;Requires tactile cues General Comments: Pt with fluctuating cognition through session, for example: stating he is in the hospital, but then telling the PT to "look outside and see for yourself" when asked how many stairs to get into his apt. Poor problem solving and safety awareness. Intermittently not answering PT questions, unsure if due to [facility], cognition, or frustration.      General Comments General comments (skin integrity, edema, etc.): VSS on RA         Assessment/Plan    PT Assessment Patient needs continued PT services  PT Problem List Decreased strength;Decreased mobility;Decreased safety awareness;Decreased activity tolerance;Decreased cognition;Decreased balance       PT Treatment Interventions DME instruction;Therapeutic exercise;Gait training;Balance training;Stair training;Functional mobility training;Therapeutic activities;Patient/family education    PT Goals (Current goals can be found in the Care Plan section)  Acute Rehab PT Goals Patient Stated Goal: be able to go home PT Goal Formulation: With patient Time For Goal Achievement: 04/16/20 Potential to Achieve Goals: Fair    Frequency Min 3X/week    AM-PAC PT "6 Clicks" Mobility  Outcome Measure Help needed turning from your back to your side while in a flat bed without using bedrails?: A Lot Help needed moving from lying on your back to sitting on the side of a flat bed without using bedrails?: A Lot Help needed moving to and from a bed to a chair (including a wheelchair)?: Total Help needed standing up from a chair using your arms (e.g., wheelchair or bedside chair)?: A Lot Help needed to walk in hospital room?: Total Help needed climbing 3-5 steps with a railing? : Total 6 Click Score: 9    End of Session Equipment Utilized During Treatment: Gait belt Activity Tolerance: Patient limited by fatigue Patient left: in bed;with call bell/phone within reach;with bed alarm set Nurse Communication: Mobility status (cog deficits) PT Visit Diagnosis: Muscle weakness (generalized) (M62.81);Difficulty in walking, not elsewhere classified (R26.2)    Time: 2878-6767 PT Time Calculation (min) (ACUTE ONLY): 38 min   Charges:   PT Evaluation $PT Eval Moderate  Complexity: 1 Mod PT Treatments $Therapeutic Activity: 8-22 mins        Karma Ganja, PT, DPT   Acute Rehabilitation Department Pager #: [phone]  Otho Bellows 04/02/2020, 4:07 PM

## 2020-04-02 NOTE — Progress Notes (Signed)
   VASCULAR SURGERY ASSESSMENT & PLAN:   POD 2 - L BKA: I changed his dressing this morning and he is below the knee amputation site looks fine. I have written for daily dressing changes.  PTx/ CIR  DVT PROPHYLAXIS: I have added subcu heparin   SUBJECTIVE:   No complaints.  PHYSICAL EXAM:   Vitals:   04/01/20 1601 04/01/20 1845 04/01/20 1900 04/02/20 0500  BP: 118/62 127/64 127/66 128/68  Pulse: 81 78 76 75  Resp: 17 18 17 17   Temp: 99.2 F (37.3 C) 99.3 F (37.4 C) 99.7 F (37.6 C) 99.5 F (37.5 C)  TempSrc: Oral Oral Oral Oral  SpO2: 99% 98% 99% 99%  Weight:      Height:       I examined his left below the knee amputation site and this looks fine.  The wound was redressed.  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 aspirin EC  81 mg Oral Daily  . atorvastatin  80 mg Oral QHS  . Chlorhexidine Gluconate Cloth  6 each Topical Q0600  . doxercalciferol  2 mcg Intravenous Q T,Th,Sa-HD  . feeding supplement (NEPRO CARB STEADY)  237 mL Oral BID BM  . feeding supplement (PROSource TF)  45 mL Per Tube BID  . finasteride  5 mg Oral Daily  . multivitamin  1 tablet Oral QHS  . sevelamer carbonate  800 mg Oral TID WC    Deitra Mayo Office: [phone] 04/02/2020

## 2020-04-02 NOTE — Progress Notes (Signed)
HD treatment order modified for 04/03/2020 per Dr Jonnie Finner, HD orders modified. Primary RN Mardea notified at [phone]

## 2020-04-02 NOTE — Progress Notes (Signed)
   Subjective:  Patient resting comfortably in bed. He denies any new symptoms and states that his pain is well controlled.  Objective:  Vital signs in last 24 hours: Vitals:   04/01/20 1900 04/02/20 0500 04/02/20 0722 04/02/20 1543  BP: 127/66 128/68 (!) 141/72 (!) 155/86  Pulse: 76 75 76 86  Resp: 17 17 17 18   Temp: 99.7 F (37.6 C) 99.5 F (37.5 C) 99.2 F (37.3 C) (!) 100.5 F (38.1 C)  TempSrc: Oral Oral Oral Oral  SpO2: 99% 99% 92% 98%  Weight:      Height:       Physical Exam Constitutional:      Appearance: Normal appearance.  HENT:     Head: Normocephalic and atraumatic.  Cardiovascular:     Rate and Rhythm: Regular rhythm. Bradycardia present.     Heart sounds: Normal heart sounds.  Pulmonary:     Effort: Pulmonary effort is normal.  Abdominal:     General: Bowel sounds are normal.     Palpations: Abdomen is soft. There is no mass.     Tenderness: There is no abdominal tenderness.  Musculoskeletal:        General: Deformity present. Swelling: left BKA. Normal range of motion.  Skin:    General: Skin is warm and dry.  Neurological:     Mental Status: He is alert and oriented to person, place, and time. Mental status is at baseline.     Motor: No weakness.     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 Admitted following procedure.  Left lower extremity PAD with gangrene s/p left BKA: Patient is s/p BKA and doing well. Pain is well controlled. Will need SNF for subacute PT. Hgb stable at 7.7 without evidence of bleeding. -Vascular following, appreciate  recommendations -Pain control with dilaudid PRN -Continue atorvastatin 80 mg daily -Continue home ASA -F/u vascular recommendations for resumption of Plavix - PT/OT   Fever: Patient has a fever of 100.60F without tachycardia. He does have a mild leukocytosis at 13.5. I will continue to monitor him closely to see if her needs antibiotic therapy. If fever continues than he will need furthers evaluation for infection.  - APAP 650 mg q 6 hours for fever.   ESRD on HD TTS: -Nephrology consulted, appreciate assistance -Monitor renal function  Prior CVA: -home ASA -Holding home Plavix -home atorvastatin 80 mg daily  History of hypertension: History of orthostatic hypotension: Continue to hold antihypertensive medications.  DM: Diet controlled, last A1c was 6.4.   - Will monitor CBGs and SSI.  Prior to Admission Living Arrangement: Home Anticipated Discharge Location: SNF? Vs HHPT Barriers to Discharge: Treatment and Dispo plan Dispo: Anticipated discharge in approximately 1-2 day(s).   Marianna Payment, MD 04/02/2020, 5:58 PM Pager: [phone] After 5pm on weekdays and 1pm on weekends: On Call pager [phone]

## 2020-04-02 NOTE — Plan of Care (Signed)
  Problem: Education: Goal: Knowledge of the prescribed therapeutic regimen will improve Outcome: Progressing   Problem: Activity: Goal: Ability to perform//tolerate increased activity and mobilize with assistive devices will improve Outcome: Progressing   Problem: Clinical Measurements: Goal: Postoperative complications will be avoided or minimized Outcome: Progressing   Problem: Self-Care: Goal: Ability to meet self-care needs will improve Outcome: Progressing

## 2020-04-02 NOTE — Progress Notes (Signed)
Patient's daughter, Jelani, [place] was given an update about patient's status. Concerned about patient's altered mental status. Will inform MD.

## 2020-04-03 LAB — GLUCOSE, CAPILLARY
Glucose-Capillary: 115 mg/dL — ABNORMAL HIGH (ref 70–99)
Glucose-Capillary: 118 mg/dL — ABNORMAL HIGH (ref 70–99)
Glucose-Capillary: 121 mg/dL — ABNORMAL HIGH (ref 70–99)
Glucose-Capillary: 147 mg/dL — ABNORMAL HIGH (ref 70–99)
Glucose-Capillary: 157 mg/dL — ABNORMAL HIGH (ref 70–99)

## 2020-04-03 LAB — COMPREHENSIVE METABOLIC PANEL
ALT: 8 U/L (ref 0–44)
AST: 15 U/L (ref 15–41)
Albumin: 1.8 g/dL — ABNORMAL LOW (ref 3.5–5.0)
Alkaline Phosphatase: 56 U/L (ref 38–126)
Anion gap: 10 (ref 5–15)
BUN: 40 mg/dL — ABNORMAL HIGH (ref 8–23)
CO2: 25 mmol/L (ref 22–32)
Calcium: 8.2 mg/dL — ABNORMAL LOW (ref 8.9–10.3)
Chloride: 101 mmol/L (ref 98–111)
Creatinine, Ser: 5.04 mg/dL — ABNORMAL HIGH (ref 0.61–1.24)
GFR calc Af Amer: 13 mL/min — ABNORMAL LOW (ref >60)
GFR calc non Af Amer: 11 mL/min — ABNORMAL LOW (ref >60)
Glucose, Bld: 131 mg/dL — ABNORMAL HIGH (ref 70–99)
Potassium: 4.1 mmol/L (ref 3.5–5.1)
Sodium: 136 mmol/L (ref 135–145)
Total Bilirubin: 0.5 mg/dL (ref 0.3–1.2)
Total Protein: 5.6 g/dL — ABNORMAL LOW (ref 6.5–8.1)

## 2020-04-03 LAB — CBC
HCT: 25.4 % — ABNORMAL LOW (ref 39.0–52.0)
Hemoglobin: 7.8 g/dL — ABNORMAL LOW (ref 13.0–17.0)
MCH: 26.8 pg (ref 26.0–34.0)
MCHC: 30.7 g/dL (ref 30.0–36.0)
MCV: 87.3 fL (ref 80.0–100.0)
Platelets: 323 10*3/uL (ref 150–400)
RBC: 2.91 MIL/uL — ABNORMAL LOW (ref 4.22–5.81)
RDW: 15.8 % — ABNORMAL HIGH (ref 11.5–15.5)
WBC: 11.5 10*3/uL — ABNORMAL HIGH (ref 4.0–10.5)
nRBC: 0 % (ref 0.0–0.2)

## 2020-04-03 NOTE — NC FL2 (Signed)
[place] LEVEL OF CARE SCREENING TOOL     IDENTIFICATION  Patient Name: Dennis Macias Birthdate: 1950-11-21 Sex: male Admission Date (Current Location): 03/31/2020  [facility] and [place] Number:  Herbalist and Address:  [facility] [place] [place] [place], [place] [address], [place], [place] [address]      Provider Number: [id number]  Attending Physician Name and Address:  Oda Kilts, MD  Relative Name and Phone Number:  [place], [place] other [phone]    Current Level of Care: Hospital Recommended Level of Care: [place] Prior Approval Number:    Date Approved/Denied:   PASRR Number: [id number]  Discharge Plan: SNF    Current Diagnoses: Patient Active Problem List   Diagnosis Date Noted  . S/P BKA (below knee amputation) ([place]) 03/31/2020  . PAD (peripheral artery disease) ([place]) 02/18/2020  . Orthostatic hypotension 02/18/2020  . CVA (cerebral vascular accident) ([place]) 02/17/2020  . ESRD (end stage renal disease) ([place])   . Goals of care, counseling/discussion   . Acute on chronic combined systolic and diastolic CHF (congestive heart failure) ([place]) 12/24/2019  . Non-Obstructive CAD 12/14/2019  . BPH (benign prostatic hyperplasia) 12/08/2019  . Anemia of chronic disease   . Protein-calorie malnutrition, severe 11/19/2019  . Gait disorder   . Chronic combined systolic and diastolic heart failure ([place])   . Left renal mass 07/21/2019  . CKD (chronic kidney disease) stage 4, GFR 15-29 ml/min (HCC) 04/23/2019  . Weight loss, non-intentional 08/26/2017  . 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 Acute on chronic anemia 12/21/2016  . Hyperlipidemia 09/13/2015  . 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 Essential hypertension     Orientation RESPIRATION BLADDER Height & Weight     Self, Place  Normal Continent Weight: 147 lb 14.9 oz (67.1 kg) Height:  6' (182.9 cm)  BEHAVIORAL SYMPTOMS/MOOD NEUROLOGICAL  BOWEL NUTRITION STATUS      Continent Diet (Renal, carb modified with fluid restriction.  See DC summary.)  AMBULATORY STATUS COMMUNICATION OF NEEDS Skin   Total Care Verbally Surgical wounds                       Personal Care Assistance Level of Assistance  Bathing, Feeding, Dressing Bathing Assistance: Maximum assistance Feeding assistance: Limited assistance Dressing Assistance: Maximum assistance     Functional Limitations Info  Sight, Hearing, Speech Sight Info: Adequate Hearing Info: Adequate Speech Info: Adequate    SPECIAL CARE FACTORS FREQUENCY  PT (By licensed PT), OT (By licensed OT)     PT Frequency: 5x week OT Frequency: 5x week            Contractures Contractures Info: Not present    Additional Factors Info  Code Status, Allergies Code Status Info: full Allergies Info: cefepime           Current Medications (04/03/2020):  This is the current hospital active medication list Current Facility-Administered Medications  Medication Dose Route Frequency Provider Last Rate Last Admin  . 0.9 %  sodium chloride infusion   Intravenous Continuous [name], MD 10 mL/hr at 03/31/20 0735 Restarted at 03/31/20 0958  . acetaminophen (TYLENOL) tablet 650 mg  650 mg Oral Q6H PRN [name], MD   650 mg at 04/03/20 0542   Or  . acetaminophen (TYLENOL) suppository 650 mg  650 mg Rectal Q6H PRN [name], MD      . aspirin EC tablet 81 mg  81  mg Oral Daily [name], MD   81 mg at 04/03/20 1006  . atorvastatin (LIPITOR) tablet 80 mg  80 mg Oral QHS [name], MD   80 mg at 04/02/20 2156  . Chlorhexidine Gluconate Cloth 2 % PADS 6 each  6 each Topical Q0600 Alric Seton, PA-C   6 each at 04/03/20 0543  . [START ON 04/05/2020] Darbepoetin Alfa (ARANESP) injection 150 mcg  150 mcg Intravenous Q Tue-HD Alric Seton, PA-C      . doxercalciferol (HECTOROL) injection 2 mcg  2 mcg Intravenous Q T,Th,Sa-HD Valentina Gu, NP      . feeding supplement (NEPRO CARB STEADY) liquid 237 mL  237 mL Oral BID BM Alric Seton, PA-C   237 mL at 04/02/20 1341  . feeding supplement (PROSource TF) liquid 45 mL  45 mL Per Tube BID Valentina Gu, NP   45 mL at 04/02/20 0828  . [START ON 04/05/2020] ferric gluconate (NULECIT) 125 mg in sodium chloride 0.9 % 100 mL IVPB  125 mg Intravenous Q T,Th,Sa-HD Esmond Plants, RPH      . finasteride (PROSCAR) tablet 5 mg  5 mg Oral Daily Lonia Skinner M, MD   5 mg at 04/03/20 1006  . heparin injection 5,000 Units  5,000 Units Subcutaneous Q8H Oda Kilts, MD   5,000 Units at 04/03/20 1006  . insulin aspart (novoLOG) injection 0-9 Units  0-9 Units Subcutaneous Q4H Marianna Payment, MD   2 Units at 04/03/20 0042  . multivitamin (RENA-VIT) tablet 1 tablet  1 tablet Oral QHS Valentina Gu, NP   1 tablet at 04/02/20 2156  . oxyCODONE (Oxy IR/ROXICODONE) immediate release tablet 10 mg  10 mg Oral Q4H PRN Serafina Mitchell, MD   10 mg at 04/02/20 1655  . sevelamer carbonate (RENVELA) tablet 800 mg  800 mg Oral TID WC Valentina Gu, NP   800 mg at 04/03/20 0830     Discharge Medications: Please see discharge summary for a list of discharge medications.  Relevant Imaging Results:  Relevant Lab Results:   Additional Information SSN [id number]  Joanne Chars, LCSW

## 2020-04-03 NOTE — Progress Notes (Signed)
[facility] Progress Note   Dialysis Orders: [place] T,Th,S 4 hrs 180NRe 400/600 68.5 kg 2.0 K/2.25 Ca RIJ TDC/R AVF -Heparin 2000 units IV TIW -Hectorol 2 mcg IV TIW -Venofer 100 mg IV X 10 doses (1/10 doses given 1st dose 03/29/2020) -Venofer 50 mg IV weekly when Fe load completed -Mircera 150 mcg IV q 2 weeks (Last 03/23/2020)  Assessment/Plan: 1.  LLE gangrene/PAD S/P L BKA  Dr. Trula Slade 03/31/2020. Per primary/VVS.;  2.  ESRD - TTS via TDC. Saturday HD postponed to today due to staffing issues - might be postponed until Monday K 4.1 Last HD 9/23 3.  Hx orthostatic hypotension/HTN/volume  BP controlled - BP ok net UF 1 L -post wt 65.4 - lower edw for d/c  4.  Anemia  - HGB 9.1 > 7 > 7.7  > 7.8 after 1 unit PRBC 9/24. Stable. Continue ESA. Ok to resume IV Fe post BKA 5.  Metabolic bone disease - OP BMD labs at goal. Continue binders, VDRA.  6.  Nutrition -alb low 1.8  Renal/Carb mod diet. Albumin low. Add protein supps, renal vits. High risk for inadequate intake  7. DMT2-per primary 8. Hx CVA  9.  Disp.  SNF vs HHPT - seems very frail to me. 10. Fevers 9/25 Tmax 100.5 - follow trend WBC stable. At risk due to [place], PA-C [place] [phone] 04/03/2020,8:14 AM  LOS: 3 days   Subjective:   Waiting on breakfast. No c/o  Objective Vitals:   04/02/20 1543 04/02/20 1936 04/03/20 0300 04/03/20 0500  BP: (!) 155/86 131/70 124/71   Pulse: 86 75 77   Resp: 18 17 17    Temp: (!) 100.5 F (38.1 C) 99.2 F (37.3 C) 99.4 F (37.4 C)   TempSrc: Oral Oral Oral   SpO2: 98% 95% 100%   Weight:    67.1 kg  Height:       Physical Exam General: frail older male NAD Heart: RRR Lungs: no rales Abdomen: soft NT Extremities: left BKA wrapped - RLE no edema Dialysis Access: right IJ TDC and maturing right AVF + bruit   Additional Objective Labs: Basic Metabolic Panel: Recent Labs  Lab 04/01/20 0348 04/02/20 0323  04/03/20 0621  NA 137 135 136  K 3.8 3.8 4.1  CL 97* 100 101  CO2 30 25 25   GLUCOSE 147* 182* 131*  BUN 13 26* 40*  CREATININE 2.53* 3.79* 5.04*  CALCIUM 8.2* 8.3* 8.2*   Liver Function Tests: Recent Labs  Lab 03/31/20 0545 04/03/20 0621  AST 12* 15  ALT 7 8  ALKPHOS 72 56  BILITOT 0.6 0.5  PROT 6.6 5.6*  ALBUMIN 2.2* 1.8*   No results for input(s): LIPASE, AMYLASE in the last 168 hours. CBC: Recent Labs  Lab 03/31/20 0547 03/31/20 0945 04/01/20 0348 04/01/20 0348 04/01/20 1214 04/02/20 0323 04/03/20 0621  WBC 13.4*   < > 10.5  --   --  13.2* 11.5*  NEUTROABS  --   --   --   --   --  10.5*  --   HGB 9.1*   < > 7.0*   < > 6.4* 7.7* 7.8*  HCT 31.4*   < > 23.8*   < > 21.4* 25.1* 25.4*  MCV 90.5  --  89.5  --   --  87.2 87.3  PLT 447*   < > 282  --   --  272 323   < > = values  in this interval not displayed.   CBG: Recent Labs  Lab 03/31/20 1014 03/31/20 1358 04/02/20 2139 04/03/20 0032 04/03/20 0404  GLUCAP 177* 162* 154* 157* 115*   Iron Studies:  Recent Labs    04/02/20 0806  FERRITIN 302   Lab Results  Component Value Date   INR 1.6 (H) 03/31/2020   INR 1.5 (H) 02/17/2020   INR 1.9 (H) 01/14/2020   Studies/Results: No results found. Medications: . sodium chloride 10 mL/hr at 03/31/20 0735  . [START ON 04/05/2020] ferric gluconate (FERRLECIT/NULECIT) IV     . aspirin EC  81 mg Oral Daily  . atorvastatin  80 mg Oral QHS  . Chlorhexidine Gluconate Cloth  6 each Topical Q0600  . [START ON 04/05/2020] darbepoetin (ARANESP) injection - DIALYSIS  150 mcg Intravenous Q Tue-HD  . doxercalciferol  2 mcg Intravenous Q T,Th,Sa-HD  . feeding supplement (NEPRO CARB STEADY)  237 mL Oral BID BM  . feeding supplement (PROSource TF)  45 mL Per Tube BID  . finasteride  5 mg Oral Daily  . heparin  5,000 Units Subcutaneous Q8H  . insulin aspart  0-9 Units Subcutaneous Q4H  . multivitamin  1 tablet Oral QHS  . sevelamer carbonate  800 mg Oral TID WC

## 2020-04-03 NOTE — Progress Notes (Signed)
   VASCULAR SURGERY ASSESSMENT & PLAN:   POD 3 - L BKA: I changed his dressing yesterday. His below the knee amputation site looks fine. I have written for daily dressing changes.  PTx/ CIR  DVT PROPHYLAXIS: I have added subcu heparin   SUBJECTIVE:   No complaints this morning.  PHYSICAL EXAM:   Vitals:   04/02/20 1543 04/02/20 1936 04/03/20 0300 04/03/20 0500  BP: (!) 155/86 131/70 124/71   Pulse: 86 75 77   Resp: 18 17 17    Temp: (!) 100.5 F (38.1 C) 99.2 F (37.3 C) 99.4 F (37.4 C)   TempSrc: Oral Oral Oral   SpO2: 98% 95% 100%   Weight:    67.1 kg  Height:       His dressing on the left BKA is dry.  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 aspirin EC  81 mg Oral Daily  . atorvastatin  80 mg Oral QHS  . Chlorhexidine Gluconate Cloth  6 each Topical Q0600  . [START ON 04/05/2020] darbepoetin (ARANESP) injection - DIALYSIS  150 mcg Intravenous Q Tue-HD  . doxercalciferol  2 mcg Intravenous Q T,Th,Sa-HD  . feeding supplement (NEPRO CARB STEADY)  237 mL Oral BID BM  . feeding supplement (PROSource TF)  45 mL Per Tube BID  . finasteride  5 mg Oral Daily  . heparin  5,000 Units Subcutaneous Q8H  . insulin aspart  0-9 Units Subcutaneous Q4H  . multivitamin  1 tablet Oral QHS  . sevelamer carbonate  800 mg Oral TID WC    Deitra Mayo Office: [phone] 04/03/2020

## 2020-04-03 NOTE — TOC Initial Note (Signed)
Transition of Care [facility]) - Initial/Assessment Note    Patient Details  Name: Dennis Macias MRN: [id number] Date of Birth: [birth year]  [facility]) CM/SW Contact:    Joanne Chars, LCSW Phone Number: 04/03/2020, 11:35 AM  Clinical Narrative:   CSW met with pt to discuss DC plan.  Pt agreeable to SNF recommendation, permission given to send out SNF referrals and to speak with girlfriend, Kaiyon Hynes.  Pt is vaccinated for covid.                 Expected Discharge Plan: Skilled Nursing Facility Barriers to Discharge: Continued Medical Work up, SNF Pending bed offer   Patient Goals and CMS Choice Patient states their goals for this hospitalization and ongoing recovery are:: "be able to get around" CMS Medicare.gov Compare Post Acute Care list provided to:: Patient Choice offered to / list presented to : Patient  Expected Discharge Plan and Services Expected Discharge Plan: [place] Choice: [place] Living arrangements for the past 2 months: Apartment                                      Prior Living Arrangements/Services Living arrangements for the past 2 months: Apartment Lives with:: Significant Other Patient language and need for interpreter reviewed:: Yes Do you feel safe going back to the place where you live?: Yes      Need for Family Participation in Patient Care: No (Comment) Care giver support system in place?: Yes (comment)   Criminal Activity/Legal Involvement Pertinent to Current Situation/Hospitalization: No - Comment as needed  Activities of Daily Living Home Assistive Devices/Equipment: Eyeglasses, Cane (specify quad or straight), CBG Meter, Blood pressure cuff ADL Screening (condition at time of admission) Patient's cognitive ability adequate to safely complete daily activities?: Yes Is the patient deaf or have difficulty hearing?: No Does the patient have difficulty seeing, even when  wearing glasses/contacts?: No Does the patient have difficulty concentrating, remembering, or making decisions?: Yes Patient able to express need for assistance with ADLs?: Yes Does the patient have difficulty dressing or bathing?: Yes Independently performs ADLs?: Yes (appropriate for developmental age) Does the patient have difficulty walking or climbing stairs?: Yes Weakness of Legs: Both Weakness of Arms/Hands: None  Permission Sought/Granted Permission sought to share information with : Facility Sport and exercise psychologist, Family Supports Permission granted to share information with : Yes, Verbal Permission Granted  Share Information with NAME: Lyall Faciane, girlfriend  Permission granted to share info w AGENCY: SNF        Emotional Assessment Appearance:: Appears stated age Attitude/Demeanor/Rapport: Engaged Affect (typically observed): Pleasant Orientation: : Oriented to Self, Oriented to Place Alcohol / Substance Use: Not Applicable Psych Involvement: No (comment)  Admission diagnosis:  S/P BKA (below knee amputation) ([place]) [Z89.519] Patient Active Problem List   Diagnosis Date Noted  . S/P BKA (below knee amputation) ([place]) 03/31/2020  . PAD (peripheral artery disease) ([place]) 02/18/2020  . Orthostatic hypotension 02/18/2020  . CVA (cerebral vascular accident) ([place]) 02/17/2020  . ESRD (end stage renal disease) ([place])   . Goals of care, counseling/discussion   . Acute on chronic combined systolic and diastolic CHF (congestive heart failure) ([place]) 12/24/2019  . Non-Obstructive CAD 12/14/2019  . BPH (benign prostatic hyperplasia) 12/08/2019  . Anemia of chronic disease   . Protein-calorie malnutrition, severe 11/19/2019  . Gait disorder   .  Chronic combined systolic and diastolic heart failure ([place])   . Left renal mass 07/21/2019  . CKD (chronic kidney disease) stage 4, GFR 15-29 ml/min (HCC) 04/23/2019  . Weight loss, non-intentional 08/26/2017  . Focal hyperhidrosis  08/01/2017  . Acute on chronic anemia 12/21/2016  . Hyperlipidemia 09/13/2015  . Diabetes mellitus with retinopathy of both eyes ([place])   . Essential hypertension    PCP:  [name], MD Pharmacy:   [place], [place] - [address] [facility]. [address] [place] [address] Phone: [phone] Fax: [place], [place] - [place] [address] Phone: [phone] Fax: [phone]     Social Determinants of Health (SDOH) Interventions    Readmission Risk Interventions Readmission Risk Prevention Plan 01/19/2020 11/10/2019 08/10/2019  Transportation Screening Complete Complete Complete  PCP or Specialist Appt within 3-5 Days - - Complete  HRI or [place] - - Complete  Social Work Consult for [place] Planning/Counseling - - Complete  Palliative Care Screening - - Complete  Medication Review Press photographer) Complete Complete Complete  PCP or Specialist appointment within 3-5 days of discharge Complete - -  [place] or Home Care Consult Complete Complete -  SW Recovery Care/Counseling Consult Complete Complete -  Palliative Care Screening Complete Not Applicable -  [place] Complete Complete -  Some recent data might be hidden

## 2020-04-03 NOTE — Progress Notes (Signed)
HD#3 Subjective:  Overnight Events: Patient's daughter was concerned overnight that the patient wasn't acting like himself.    Patient was alert and oriented to person, place, time and event. He states that his pain is well controlled form the surgery, but does admitted to feeling warm overnight.  Objective:  Vital signs in last 24 hours: Vitals:   04/02/20 1543 04/02/20 1936 04/03/20 0300 04/03/20 0500  BP: (!) 155/86 131/70 124/71   Pulse: 86 75 77   Resp: 18 17 17    Temp: (!) 100.5 F (38.1 C) 99.2 F (37.3 C) 99.4 F (37.4 C)   TempSrc: Oral Oral Oral   SpO2: 98% 95% 100%   Weight:    67.1 kg  Height:       Supplemental O2: Room Air SpO2: 100 % O2 Flow Rate (L/min): n/a   Physical Exam:  Physical Exam Constitutional:      General: He is not in acute distress.    Appearance: He is diaphoretic. He is not ill-appearing.  HENT:     Head: Normocephalic and atraumatic.  Cardiovascular:     Rate and Rhythm: Normal rate and regular rhythm.     Pulses: Normal pulses.     Heart sounds: Normal heart sounds.  Pulmonary:     Effort: Pulmonary effort is normal.     Breath sounds: Normal breath sounds.  Abdominal:     General: Abdomen is flat. There is no distension.     Palpations: Abdomen is soft.     Tenderness: There is no abdominal tenderness.  Musculoskeletal:        General: Tenderness (left BKA stump) present. Normal range of motion.     Comments: Surgical site inspected today on round and appears clean and dry without surrounding edema, erythema or exudates.  Skin:    General: Skin is warm.  Neurological:     General: No focal deficit present.     Mental Status: He is alert and oriented to person, place, and time.  Psychiatric:        Mood and Affect: Mood normal.     Filed Weights   03/31/20 1417 03/31/20 1751 04/03/20 0500  Weight: 66.7 kg 65.4 kg 67.1 kg     Intake/Output Summary (Last 24 hours) at 04/03/2020 0644 Last data filed at 04/02/2020  2100 Gross per 24 hour  Intake --  Output 100 ml  Net -100 ml   Net IO Since Admission: -750 mL [04/03/20 0644]  Pertinent Labs: CBC Latest Ref Rng & Units 04/02/2020 04/01/2020 04/01/2020  WBC 4.0 - 10.5 K/uL 13.2(H) - 10.5  Hemoglobin 13.0 - 17.0 g/dL 7.7(L) 6.4(LL) 7.0(L)  Hematocrit 39 - 52 % 25.1(L) 21.4(L) 23.8(L)  Platelets 150 - 400 K/uL 272 - 282    CMP Latest Ref Rng & Units 04/02/2020 04/01/2020 03/31/2020  Glucose 70 - 99 mg/dL 182(H) 147(H) 194(H)  BUN 8 - 23 mg/dL 26(H) 13 23  Creatinine 0.61 - 1.24 mg/dL 3.79(H) 2.53(H) 3.50(H)  Sodium 135 - 145 mmol/L 135 137 136  Potassium 3.5 - 5.1 mmol/L 3.8 3.8 4.2  Chloride 98 - 111 mmol/L 100 97(L) 96(L)  CO2 22 - 32 mmol/L 25 30 -  Calcium 8.9 - 10.3 mg/dL 8.3(L) 8.2(L) -  Total Protein 6.5 - 8.1 g/dL - - -  Total Bilirubin 0.3 - 1.2 mg/dL - - -  Alkaline Phos 38 - 126 U/L - - -  AST 15 - 41 U/L - - -  ALT 0 -  44 U/L - - -    Imaging: No results found.  Assessment/Plan:   Principal Problem:   S/P BKA (below knee amputation) (HCC) Active Problems:   Diabetes mellitus with retinopathy of both eyes ([place])   Essential hypertension   ESRD (end stage renal disease) ([place])   Patient Summary: This is a 69 year old male with a history of ESRD on HD TTS,HTN, CVA, DM, and PAD s/p failed revascularization whocontinued to have deterioration of his wound, vascular brought into the hospital for a left below knee amputation. Admitted following procedure.  Left lower extremity PAD with gangrene s/p left BKA: Patient is s/p BKA and doing well. His pain is well controlled and surgical site appears clean without evidence of infection. Will need SNF for subacute PT. Hgb is stable at 7.8 without evidence of bleeding. -Pain control withdilaudid PRN -Continue atorvastatin 80 mg daily -Continue home ASA - Continue PT/OT   Fever: Leukocytosis:  Patient has a fever of 100.45F without tachycardia. He does have a mild leukocytosis at  13.5 that has improved today at 11.5. He isdiaphoretic, but overall patient appears comfortable. Patient does not have any obvious site of infection and therefore, not emergent need for antibiotic therapy. Fever and leukocytosis likely 2/2 to acute inflammatory process in the setting of recent surgery. - Blood culture pending - Trend WBC daily - APAP 650 mg q 6 hours for fever.   ESRD on HD TTS: -Nephrology consulted, appreciate assistance -Monitor renal function daily - Next HD on Monday.  Normocytic hyperproliferative Anemia:  Status post 1 unit PRBC with stable Hgb 9.1>7>7.7>7.8. Reticulocyte index of 1.12 indicating hyperproliferation. Calculated iron deficiency of 600 mg taking into account recent unit of blood. He will likely benefit form IV iron transfusion during this hospitalization. Will hold off at this point due to concern for infection. Will initiate transfusion once fever and leukocytosis resolves. Patient would likely benefit form ESA injections as this is likely anemia of chronic kidney disease.  - Will give ferriheme once fever resolves.  - Goal Hgb of 10-12 once stable. - Transfusion goal of < 7.  Prior CVA: -home ASA -Holding homePlavix -home atorvastatin 80 mg daily  History of hypertension: History of orthostatic hypotension: Continue to hold antihypertensive medications.  DM: Diet controlled, last A1c was 6.4.  - Will monitor CBGs and SSI.  Diet: Renal IVF: None,None VTE: Heparin Code: Full PT/OT recs: SNF for Subacute PT, none. TOC recs: SNF   Dispo: Anticipated discharge to Skilled nursing facility in 1 days pending placement.    Please contact the on call pager after 5 pm and on weekends at [phone].

## 2020-04-04 DIAGNOSIS — D72829 Elevated white blood cell count, unspecified: Secondary | ICD-10-CM

## 2020-04-04 DIAGNOSIS — R509 Fever, unspecified: Secondary | ICD-10-CM

## 2020-04-04 DIAGNOSIS — D619 Aplastic anemia, unspecified: Secondary | ICD-10-CM

## 2020-04-04 LAB — CBC
HCT: 26.5 % — ABNORMAL LOW (ref 39.0–52.0)
Hemoglobin: 7.8 g/dL — ABNORMAL LOW (ref 13.0–17.0)
MCH: 25.8 pg — ABNORMAL LOW (ref 26.0–34.0)
MCHC: 29.4 g/dL — ABNORMAL LOW (ref 30.0–36.0)
MCV: 87.7 fL (ref 80.0–100.0)
Platelets: 352 10*3/uL (ref 150–400)
RBC: 3.02 MIL/uL — ABNORMAL LOW (ref 4.22–5.81)
RDW: 15.6 % — ABNORMAL HIGH (ref 11.5–15.5)
WBC: 9.3 10*3/uL (ref 4.0–10.5)
nRBC: 0 % (ref 0.0–0.2)

## 2020-04-04 LAB — BASIC METABOLIC PANEL
Anion gap: 13 (ref 5–15)
BUN: 48 mg/dL — ABNORMAL HIGH (ref 8–23)
CO2: 24 mmol/L (ref 22–32)
Calcium: 8 mg/dL — ABNORMAL LOW (ref 8.9–10.3)
Chloride: 99 mmol/L (ref 98–111)
Creatinine, Ser: 5.55 mg/dL — ABNORMAL HIGH (ref 0.61–1.24)
GFR calc Af Amer: 11 mL/min — ABNORMAL LOW (ref >60)
GFR calc non Af Amer: 10 mL/min — ABNORMAL LOW (ref >60)
Glucose, Bld: 128 mg/dL — ABNORMAL HIGH (ref 70–99)
Potassium: 4.2 mmol/L (ref 3.5–5.1)
Sodium: 136 mmol/L (ref 135–145)

## 2020-04-04 LAB — GLUCOSE, CAPILLARY
Glucose-Capillary: 119 mg/dL — ABNORMAL HIGH (ref 70–99)
Glucose-Capillary: 134 mg/dL — ABNORMAL HIGH (ref 70–99)
Glucose-Capillary: 135 mg/dL — ABNORMAL HIGH (ref 70–99)
Glucose-Capillary: 168 mg/dL — ABNORMAL HIGH (ref 70–99)
Glucose-Capillary: 97 mg/dL (ref 70–99)

## 2020-04-04 LAB — PREPARE RBC (CROSSMATCH)

## 2020-04-04 MED ORDER — HEPARIN SODIUM (PORCINE) 1000 UNIT/ML IJ SOLN
INTRAMUSCULAR | Status: AC
  Administered 2020-04-04: 1000 [IU]
  Filled 2020-04-04: qty 2

## 2020-04-04 MED ORDER — SODIUM CHLORIDE 0.9 % IV SOLN
510.0000 mg | Freq: Once | INTRAVENOUS | Status: DC
  Administered 2020-04-04: 510 mg via INTRAVENOUS
  Filled 2020-04-04: qty 17

## 2020-04-04 MED ORDER — CHLORHEXIDINE GLUCONATE CLOTH 2 % EX PADS
6.0000 | MEDICATED_PAD | Freq: Every day | CUTANEOUS | Status: DC
  Administered 2020-04-05 – 2020-04-06 (×2): 6 via TOPICAL

## 2020-04-04 MED ORDER — SODIUM CHLORIDE 0.9% IV SOLUTION: Freq: Once | INTRAVENOUS | Status: DC

## 2020-04-04 NOTE — Progress Notes (Signed)
  Progress Note    04/04/2020 7:14 AM 4 Days Post-Op  Subjective:  Resting comfortably.  Says his pain is controlled except if he moves his leg a lot  Tm 99.5  Vitals:   04/03/20 1939 04/04/20 0412  BP: (!) 141/77 131/75  Pulse: 73 69  Resp: 17 17  Temp: 98.8 F (37.1 C) 99.5 F (37.5 C)  SpO2: 99% 99%    Physical Exam: Incisions:  Bandage in place and is clean and dry.    CBC    Component Value Date/Time   WBC 9.3 04/04/2020 0422   RBC 3.02 (L) 04/04/2020 0422   HGB 7.8 (L) 04/04/2020 0422   HGB 9.6 (L) 12/10/2019 1439   HCT 26.5 (L) 04/04/2020 0422   HCT 28.4 (L) 12/10/2019 1439   PLT 352 04/04/2020 0422   PLT 285 12/10/2019 1439   MCV 87.7 04/04/2020 0422   MCV 89 12/10/2019 1439   MCH 25.8 (L) 04/04/2020 0422   MCHC 29.4 (L) 04/04/2020 0422   RDW 15.6 (H) 04/04/2020 0422   RDW 14.5 12/10/2019 1439   LYMPHSABS 1.6 04/02/2020 0323   LYMPHSABS 2.0 02/24/2018 1142   MONOABS 1.0 04/02/2020 0323   EOSABS 0.0 04/02/2020 0323   EOSABS 0.1 02/24/2018 1142   BASOSABS 0.0 04/02/2020 0323   BASOSABS 0.0 02/24/2018 1142    BMET    Component Value Date/Time   NA 136 04/04/2020 0422   NA 137 12/10/2019 1439   K 4.2 04/04/2020 0422   CL 99 04/04/2020 0422   CO2 24 04/04/2020 0422   GLUCOSE 128 (H) 04/04/2020 0422   BUN 48 (H) 04/04/2020 0422   BUN 80 (HH) 12/10/2019 1439   CREATININE 5.55 (H) 04/04/2020 0422   CALCIUM 8.0 (L) 04/04/2020 0422   GFRNONAA 10 (L) 04/04/2020 0422   GFRAA 11 (L) 04/04/2020 0422    INR    Component Value Date/Time   INR 1.6 (H) 03/31/2020 0547     Intake/Output Summary (Last 24 hours) at 04/04/2020 0714 Last data filed at 04/04/2020 0500 Gross per 24 hour  Intake 200 ml  Output 330 ml  Net -130 ml     Assessment/Plan:  69 y.o. male is s/p left below knee amputation  4 Days Post-Op  -bandage is clean and dry.  Daily dressing changes written for.  -pt leaving for HD-will take down dressing tomorrow.   Leontine Locket, PA-C [facility] [phone] 04/04/2020 7:14 AM

## 2020-04-04 NOTE — TOC Progression Note (Addendum)
Transition of Care [facility]) - Progression Note    Patient Details  Name: Dennis Macias MRN: [id number] Date of Birth: 1951/02/10  Transition of Care [facility]) CM/SW [place], [place] Phone Number: 04/04/2020, 12:46 PM  Clinical Narrative:     CSW spoke with pt and his girlfriend bed side. CSW presented bed offers and answered questions about SNF process. Pt and girlfriend are okay with SNF liaisons to reach out to discuss facilities. CSW will follow up about choice. Girlfriend requests CSW check on availability at [facility]. Pt gets dialysis at [facility] Tue/Thur/Sat. He gets there via [facility].   CSW contacted [place] from [employer]. She informed CSW that no SNF bed's available.   Expected Discharge Plan: Skilled Nursing Facility Barriers to Discharge: Continued Medical Work up, SNF Pending bed offer  Expected Discharge Plan and Services Expected Discharge Plan: [place] Choice: [place] Living arrangements for the past 2 months: Apartment                                       Social Determinants of Health (SDOH) Interventions    Readmission Risk Interventions Readmission Risk Prevention Plan 01/19/2020 11/10/2019 08/10/2019  Transportation Screening Complete Complete Complete  PCP or Specialist Appt within 3-5 Days - - Complete  HRI or [place] - - Complete  Social Work Consult for [place] Planning/Counseling - - Complete  Palliative Care Screening - - Complete  Medication Review Press photographer) Complete Complete Complete  PCP or Specialist appointment within 3-5 days of discharge Complete - -  [place] or Home Care Consult Complete Complete -  SW Recovery Care/Counseling Consult Complete Complete -  Palliative Care Screening Complete Not Applicable -  [place] Complete Complete -  Some recent data might be hidden

## 2020-04-04 NOTE — Plan of Care (Signed)
  Problem: Education: Goal: Knowledge of the prescribed therapeutic regimen will improve Outcome: Progressing   Problem: Skin Integrity: Goal: Demonstration of wound healing without infection will improve Outcome: Progressing   Problem: Clinical Measurements: Goal: Will remain free from infection Outcome: Progressing   Problem: Clinical Measurements: Goal: Cardiovascular complication will be avoided Outcome: Progressing   Problem: Pain Managment: Goal: General experience of comfort will improve Outcome: Progressing   Problem: Safety: Goal: Ability to remain free from injury will improve Outcome: Progressing   Problem: Skin Integrity: Goal: Risk for impaired skin integrity will decrease Outcome: Progressing

## 2020-04-04 NOTE — Progress Notes (Signed)
   Subjective: No acute overnight events. Patient in HD this AM and reports that he is hungry. Patient also reports that he started having no back pain when he started HD this AM. This is not common for him. Otherwise his leg pain is well controlled unless he moves it a lot. Patient does not endorse chest pain or SOB this AM.   Objective:  Vital signs in last 24 hours: Vitals:   04/04/20 0830 04/04/20 0900 04/04/20 0930 04/04/20 1000  BP: (!) 158/89 (!) 167/87 (!) 185/89 (!) 157/70  Pulse: 69 74 61 70  Resp: 18 18 18 18   Temp:      TempSrc:      SpO2:      Weight:      Height:       Physical Exam Constitutional:      Appearance: He is not ill-appearing.  Eyes:     Comments: Cataract of R eye  Cardiovascular:     Rate and Rhythm: Normal rate and regular rhythm.  Pulmonary:     Effort: Pulmonary effort is normal.     Breath sounds: Normal breath sounds.     Comments: Apical breath sounds Abdominal:     General: Bowel sounds are normal.     Palpations: Abdomen is soft.     Tenderness: There is no abdominal tenderness.  Musculoskeletal:     Comments: LLE warm and perfused  Skin:    General: Skin is warm and dry.  Neurological:     Mental Status: He is alert.     Assessment/Plan:  Principal Problem:   S/P BKA (below knee amputation) (HCC) Active Problems:   Diabetes mellitus with retinopathy of both eyes ([place])   Essential hypertension   ESRD (end stage renal disease) ([place])  Fever- Resolved Leukocytosis: - Resolved Patient did not report feeling "sweaty" this AM. Patient has been afebrile the past 24h and leukocytosis has resolved. Patient Bcx have ngtd.  Previous fever and leukocytosis likely 2/2 to acute inflammatory process in the setting of recent surgery. - Trend WBC daily - APAP 650 mg q 6 hours for fever.  ESRD on HD TTS: -Nephrology consulted, appreciate assistance. Received HD today, due to not receiving HD on Saturday.  -Monitor renal function  daily  Normocytic hyperproliferative Anemia:  Status post 1 unit PRBC with stable Hgb 9.1>7>7.7>7.8>7.8. Reticulocyte index of 1.12 indicating hyperproliferation. Calculated iron deficiency of 600 mg taking into account recent unit of blood. Patient would likely benefit form ESA injections as this is likely anemia of chronic kidney disease.  - 510mg  Feraheme ordered - Goal Hgb of 10-12 once stable. - Transfusion goal of < 7.  Prior CVA: -home ASA -Holding homePlavix -home atorvastatin 80 mg daily  History of hypertension: History of orthostatic hypotension: BP range 132/71-185/89. Continue to hold antihypertensive medications.  DM: Diet controlled, last A1c was 6.4. -Will monitor CBGsand SSI.  Prior to Admission Living Arrangement: Home Anticipated Discharge Location: SNF Barriers to Discharge: Dispo Dispo: Anticipated discharge in approximately 1-2 day(s).   Freida Busman, MD 04/04/2020, 10:22 AM Pager: [phone] After 5pm on weekdays and 1pm on weekends: On Call pager [phone]

## 2020-04-04 NOTE — Care Management Important Message (Signed)
Important Message  Patient Details  Name: Dennis Macias MRN: [id number] Date of Birth: October 18, 1950   Medicare Important Message Given:  Yes - Important Message mailed due to current National Emergency  Verbal consent obtained due to current National Emergency  Relationship to patient: Self Contact Name: Yussuf Sawyers Call Date: 04/04/20  Time: 1159 Phone: [phone] Outcome: Spoke with contact Important Message mailed to: Patient address on file    [place] 04/04/2020, 11:59 AM

## 2020-04-04 NOTE — Progress Notes (Signed)
[facility] Progress Note   Subjective:   Patient seen and examined at bedside during dialysis.  Tolerating well so far.  States he is having no pain today.  Denies CP, SOB, n/v/d and abdominal pain.   Objective Vitals:   04/04/20 0450 04/04/20 0732 04/04/20 0737 04/04/20 0800  BP:  (!) 142/75 132/71 (!) 143/76  Pulse:  71 68 69  Resp:  18 18 18   Temp:  99.4 F (37.4 C)    TempSrc:  Oral    SpO2:  97%    Weight: 66.7 kg 65.2 kg    Height:       Physical Exam General:chronically ill appearing male in NAD Heart:RRR Lungs:CTAB Abdomen:soft, NTND Extremities:L BKA no stump edema, R no edema Dialysis Access: [facility] in use, R AVF +b/t  Filed Weights   04/03/20 0500 04/04/20 0450 04/04/20 0732  Weight: 67.1 kg 66.7 kg 65.2 kg    Intake/Output Summary (Last 24 hours) at 04/04/2020 0841 Last data filed at 04/04/2020 0500 Gross per 24 hour  Intake 200 ml  Output 330 ml  Net -130 ml    Additional Objective Labs: Basic Metabolic Panel: Recent Labs  Lab 04/02/20 0323 04/03/20 0621 04/04/20 0422  NA 135 136 136  K 3.8 4.1 4.2  CL 100 101 99  CO2 25 25 24   GLUCOSE 182* 131* 128*  BUN 26* 40* 48*  CREATININE 3.79* 5.04* 5.55*  CALCIUM 8.3* 8.2* 8.0*   Liver Function Tests: Recent Labs  Lab 03/31/20 0545 04/03/20 0621  AST 12* 15  ALT 7 8  ALKPHOS 72 56  BILITOT 0.6 0.5  PROT 6.6 5.6*  ALBUMIN 2.2* 1.8*   CBC: Recent Labs  Lab 03/31/20 0547 03/31/20 0945 04/01/20 0348 04/01/20 1214 04/02/20 0323 04/03/20 0621 04/04/20 0422  WBC 13.4*   < > 10.5  --  13.2* 11.5* 9.3  NEUTROABS  --   --   --   --  10.5*  --   --   HGB 9.1*   < > 7.0*   < > 7.7* 7.8* 7.8*  HCT 31.4*   < > 23.8*   < > 25.1* 25.4* 26.5*  MCV 90.5  --  89.5  --  87.2 87.3 87.7  PLT 447*   < > 282  --  272 323 352   < > = values in this interval not displayed.   CBG: Recent Labs  Lab 04/03/20 1212 04/03/20 1714 04/03/20 1939 04/04/20 0008 04/04/20 0411  GLUCAP 121*  118* 147* 135* 119*   Iron Studies:  Recent Labs    04/02/20 0806  FERRITIN 302   Lab Results  Component Value Date   INR 1.6 (H) 03/31/2020   INR 1.5 (H) 02/17/2020   INR 1.9 (H) 01/14/2020   Studies/Results: No results found.  Medications: . sodium chloride 10 mL/hr at 03/31/20 0735  . [START ON 04/05/2020] ferric gluconate (FERRLECIT/NULECIT) IV    . ferumoxytol     . aspirin EC  81 mg Oral Daily  . atorvastatin  80 mg Oral QHS  . Chlorhexidine Gluconate Cloth  6 each Topical Q0600  . [START ON 04/05/2020] darbepoetin (ARANESP) injection - DIALYSIS  150 mcg Intravenous Q Tue-HD  . doxercalciferol  2 mcg Intravenous Q T,Th,Sa-HD  . feeding supplement (NEPRO CARB STEADY)  237 mL Oral BID BM  . feeding supplement (PROSource TF)  45 mL Per Tube BID  . finasteride  5 mg Oral Daily  . heparin  5,000 Units  Subcutaneous Q8H  . insulin aspart  0-9 Units Subcutaneous Q4H  . multivitamin  1 tablet Oral QHS  . sevelamer carbonate  800 mg Oral TID WC    Dialysis Orders: [place] T,Th,S 4 hrs 180NRe 400/600 68.5 kg 2.0 K/2.25 Ca RIJ TDC/R AVF -Heparin 2000 units IV TIW -Hectorol 2 mcg IV TIW -Venofer 100 mg IV X 10 doses (1/10 doses given 1st dose 03/29/2020) -Venofer 50 mg IV weekly when Fe load completed -Mircera 150 mcg IV q 2 weeks (Last 03/23/2020)  Assessment/Plan: 1. LLE gangrene/PAD-  S/P L BKA  Dr. Trula Slade 03/31/2020. Pain well controlled. Per primary/VVS. 2. ESRD - TTS via TDC. HD off schedule today d/t staffing issues this weekend.  K 4.2 3. Hx orthostatic hypotension/HTN/volume  BP slightly elevated this AM, improving with UF. Net UF goal 2L. Under edw last post wt 65.4, will need lower edw on d/c. 4. Anemia - HGB 7.8 today, 1 unit PRBC 9/24. Stable. Continue ESA. Ok to resume IV Fe post BKA 5. Metabolic bone disease - CCa in goal, will check phos, BMM labs typically in goal at OP. Continue binders, VDRA. 6. Nutrition -alb low 1.8  Renal/Carb mod diet. Add  protein supps, renal vits. High risk for inadequate intake  7. DMT2-per primary 8. Hx CVA  9.  Disp.  SNF vs HHPT  10. Fevers 9/25 Tmax 99.4 last 24hrs- follow trend WBC ok. At risk due to [place], [place] 04/04/2020,8:41 AM  LOS: 4 days

## 2020-04-04 NOTE — Plan of Care (Signed)
  Problem: Activity: Goal: Ability to perform//tolerate increased activity and mobilize with assistive devices will improve Outcome: Progressing   Problem: Clinical Measurements: Goal: Postoperative complications will be avoided or minimized Outcome: Progressing   Problem: Self-Care: Goal: Ability to meet self-care needs will improve Outcome: Progressing   Problem: Self-Concept: Goal: Ability to maintain and perform role responsibilities to the fullest extent possible will improve Outcome: Progressing   Problem: Pain Management: Goal: Pain level will decrease with appropriate interventions Outcome: Progressing   Problem: Skin Integrity: Goal: Demonstration of wound healing without infection will improve Outcome: Progressing

## 2020-04-04 NOTE — Procedures (Signed)
Seen and examined on dialysis.  Blood pressure 156/80 and HR 68.  Tolerating goal.  RIJ tunneled catheter  Claudia Desanctis, MD 04/04/2020 8:22 AM

## 2020-04-04 NOTE — Progress Notes (Signed)
PT Cancellation Note  Patient Details Name: Dennis Macias MRN: [id number] DOB: 02-27-1951   Cancelled Treatment:    Reason Eval/Treat Not Completed: Other (comment).  Pt was in HD for 6 hours, too tired for any rehab today.  Agreed to try tomorrow, and will re-attempt then.   Ramond Dial 04/04/2020, 3:45 PM   Mee Hives, PT MS Acute Rehab Dept. Number: [place]

## 2020-04-05 DIAGNOSIS — M79605 Pain in left leg: Secondary | ICD-10-CM

## 2020-04-05 DIAGNOSIS — T8789 Other complications of amputation stump: Secondary | ICD-10-CM

## 2020-04-05 LAB — RENAL FUNCTION PANEL
Albumin: 1.6 g/dL — ABNORMAL LOW (ref 3.5–5.0)
Anion gap: 9 (ref 5–15)
BUN: 25 mg/dL — ABNORMAL HIGH (ref 8–23)
CO2: 27 mmol/L (ref 22–32)
Calcium: 7.8 mg/dL — ABNORMAL LOW (ref 8.9–10.3)
Chloride: 100 mmol/L (ref 98–111)
Creatinine, Ser: 3.56 mg/dL — ABNORMAL HIGH (ref 0.61–1.24)
GFR calc Af Amer: 19 mL/min — ABNORMAL LOW (ref >60)
GFR calc non Af Amer: 17 mL/min — ABNORMAL LOW (ref >60)
Glucose, Bld: 173 mg/dL — ABNORMAL HIGH (ref 70–99)
Phosphorus: 4.2 mg/dL (ref 2.5–4.6)
Potassium: 3.4 mmol/L — ABNORMAL LOW (ref 3.5–5.1)
Sodium: 136 mmol/L (ref 135–145)

## 2020-04-05 LAB — CBC WITH DIFFERENTIAL/PLATELET
Abs Immature Granulocytes: 0.03 10*3/uL (ref 0.00–0.07)
Basophils Absolute: 0 10*3/uL (ref 0.0–0.1)
Basophils Relative: 1 %
Eosinophils Absolute: 0 10*3/uL (ref 0.0–0.5)
Eosinophils Relative: 0 %
HCT: 26.6 % — ABNORMAL LOW (ref 39.0–52.0)
Hemoglobin: 8.1 g/dL — ABNORMAL LOW (ref 13.0–17.0)
Immature Granulocytes: 0 %
Lymphocytes Relative: 20 %
Lymphs Abs: 1.6 10*3/uL (ref 0.7–4.0)
MCH: 26.6 pg (ref 26.0–34.0)
MCHC: 30.5 g/dL (ref 30.0–36.0)
MCV: 87.2 fL (ref 80.0–100.0)
Monocytes Absolute: 0.5 10*3/uL (ref 0.1–1.0)
Monocytes Relative: 6 %
Neutro Abs: 5.8 10*3/uL (ref 1.7–7.7)
Neutrophils Relative %: 73 %
Platelets: 433 10*3/uL — ABNORMAL HIGH (ref 150–400)
RBC: 3.05 MIL/uL — ABNORMAL LOW (ref 4.22–5.81)
RDW: 15.2 % (ref 11.5–15.5)
WBC: 8 10*3/uL (ref 4.0–10.5)
nRBC: 0 % (ref 0.0–0.2)

## 2020-04-05 LAB — GLUCOSE, CAPILLARY
Glucose-Capillary: 109 mg/dL — ABNORMAL HIGH (ref 70–99)
Glucose-Capillary: 128 mg/dL — ABNORMAL HIGH (ref 70–99)
Glucose-Capillary: 139 mg/dL — ABNORMAL HIGH (ref 70–99)
Glucose-Capillary: 169 mg/dL — ABNORMAL HIGH (ref 70–99)
Glucose-Capillary: 179 mg/dL — ABNORMAL HIGH (ref 70–99)
Glucose-Capillary: 196 mg/dL — ABNORMAL HIGH (ref 70–99)

## 2020-04-05 MED ORDER — HYDROMORPHONE HCL 1 MG/ML IJ SOLN
0.5000 mg | Freq: Two times a day (BID) | INTRAMUSCULAR | Status: DC | PRN
  Administered 2020-04-05 – 2020-04-06 (×2): 0.5 mg via INTRAVENOUS
  Filled 2020-04-05 (×2): qty 0.5

## 2020-04-05 MED ORDER — PENTAFLUOROPROP-TETRAFLUOROETH EX AERO: 1.0000 "application " | INHALATION_SPRAY | CUTANEOUS | Status: DC | PRN

## 2020-04-05 MED ORDER — SODIUM CHLORIDE 0.9 % IV SOLN: 100.0000 mL | INTRAVENOUS | Status: DC | PRN

## 2020-04-05 MED ORDER — HEPARIN SODIUM (PORCINE) 1000 UNIT/ML DIALYSIS
1000.0000 [IU] | INTRAMUSCULAR | Status: DC | PRN
  Filled 2020-04-05: qty 1

## 2020-04-05 MED ORDER — DARBEPOETIN ALFA 150 MCG/0.3ML IJ SOSY
PREFILLED_SYRINGE | INTRAMUSCULAR | Status: AC
  Administered 2020-04-05: 150 ug via INTRAVENOUS
  Filled 2020-04-05: qty 0.3

## 2020-04-05 MED ORDER — LIDOCAINE HCL (PF) 1 % IJ SOLN: 5.0000 mL | INTRAMUSCULAR | Status: DC | PRN

## 2020-04-05 MED ORDER — ACETAMINOPHEN 325 MG PO TABS
ORAL_TABLET | ORAL | Status: AC
  Filled 2020-04-05: qty 2

## 2020-04-05 MED ORDER — ALTEPLASE 2 MG IJ SOLR: 2.0000 mg | Freq: Once | INTRAMUSCULAR | Status: DC | PRN

## 2020-04-05 MED ORDER — LIDOCAINE-PRILOCAINE 2.5-2.5 % EX CREA: 1.0000 "application " | TOPICAL_CREAM | CUTANEOUS | Status: DC | PRN

## 2020-04-05 MED ORDER — HEPARIN SODIUM (PORCINE) 1000 UNIT/ML DIALYSIS: 2000.0000 [IU] | Freq: Once | INTRAMUSCULAR | Status: DC

## 2020-04-05 MED ORDER — LIDOCAINE-PRILOCAINE 2.5-2.5 % EX CREA
1.0000 "application " | TOPICAL_CREAM | CUTANEOUS | Status: DC | PRN
  Filled 2020-04-05: qty 5

## 2020-04-05 MED ORDER — DOXERCALCIFEROL 4 MCG/2ML IV SOLN
INTRAVENOUS | Status: AC
  Administered 2020-04-05: 2 ug via INTRAVENOUS
  Filled 2020-04-05: qty 2

## 2020-04-05 MED ORDER — OXYCODONE HCL 5 MG PO TABS
ORAL_TABLET | ORAL | Status: AC
  Filled 2020-04-05: qty 2

## 2020-04-05 MED ORDER — HEPARIN SODIUM (PORCINE) 1000 UNIT/ML IJ SOLN
INTRAMUSCULAR | Status: AC
  Filled 2020-04-05: qty 4

## 2020-04-05 NOTE — Procedures (Addendum)
Seen and examined on dialysis.  Blood pressure 128/76 and HR 67.  Tolerating goal.  RIJ tunneled catheter.  No complaints and comfortable. Note he is getting a unit of PRBC's per prev plans - go ahead with transfusion to optimize  Claudia Desanctis, MD 04/05/2020  7:57 AM

## 2020-04-05 NOTE — Progress Notes (Addendum)
  Progress Note    04/05/2020 1:46 PM 5 Days Post-Op  Subjective:  Sleeping-wakes easily.  No complaints.  Afebrile  Vitals:   04/05/20 0958 04/05/20 1037  BP: 120/62 (!) 117/59  Pulse: 68 65  Resp: 16 18  Temp: 98.6 F (37 C) 98.9 F (37.2 C)  SpO2: 98% 96%    Physical Exam: Incisions:  Bandage removed and incision looks good.  Clean and dry with staples in tact.    CBC    Component Value Date/Time   WBC 8.0 04/05/2020 0433   RBC 3.05 (L) 04/05/2020 0433   HGB 8.1 (L) 04/05/2020 0433   HGB 9.6 (L) 12/10/2019 1439   HCT 26.6 (L) 04/05/2020 0433   HCT 28.4 (L) 12/10/2019 1439   PLT 433 (H) 04/05/2020 0433   PLT 285 12/10/2019 1439   MCV 87.2 04/05/2020 0433   MCV 89 12/10/2019 1439   MCH 26.6 04/05/2020 0433   MCHC 30.5 04/05/2020 0433   RDW 15.2 04/05/2020 0433   RDW 14.5 12/10/2019 1439   LYMPHSABS 1.6 04/05/2020 0433   LYMPHSABS 2.0 02/24/2018 1142   MONOABS 0.5 04/05/2020 0433   EOSABS 0.0 04/05/2020 0433   EOSABS 0.1 02/24/2018 1142   BASOSABS 0.0 04/05/2020 0433   BASOSABS 0.0 02/24/2018 1142    BMET    Component Value Date/Time   NA 136 04/05/2020 0705   NA 137 12/10/2019 1439   K 3.4 (L) 04/05/2020 0705   CL 100 04/05/2020 0705   CO2 27 04/05/2020 0705   GLUCOSE 173 (H) 04/05/2020 0705   BUN 25 (H) 04/05/2020 0705   BUN 80 (HH) 12/10/2019 1439   CREATININE 3.56 (H) 04/05/2020 0705   CALCIUM 7.8 (L) 04/05/2020 0705   GFRNONAA 17 (L) 04/05/2020 0705   GFRAA 19 (L) 04/05/2020 0705    INR    Component Value Date/Time   INR 1.6 (H) 03/31/2020 0547     Intake/Output Summary (Last 24 hours) at 04/05/2020 1346 Last data filed at 04/05/2020 0958 Gross per 24 hour  Intake 792 ml  Output 2500 ml  Net -1708 ml     Assessment/Plan:  69 y.o. male is s/p left below knee amputation  5 Days Post-Op  -incision is healing nicely with staples in tact.  -dressing placed back on stump. -[employer] contacted for stump sock.  -will check in  periodically while he is here, otherwise, pt will f/u in our office in 4-5 weeks for staple removal.    Leontine Locket, PA-C [facility] [phone] 04/05/2020 1:46 PM

## 2020-04-05 NOTE — Progress Notes (Signed)
Physical Therapy Treatment Patient Details Name: Eliberto Sole MRN: [id number] DOB: 03-16-1951 Today's Date: 04/05/2020    History of Present Illness This is a 69 year old male with a history of ESRD on HD TTS, HTN, CVA, DM, and PAD s/p failed revascularization who continued to have deterioration of his wound, vascular brought into the hospital for a left below knee amputation.    PT Comments    Patient received in bed, very pleasant and cooperative with therapy but fatigued today. See below for mobility levels- mobilized better today, but still requires heavy levels of physical assist for functional transfers. Made multiple attempts at standing/stand pivots but unable to perform safely due to gross weakness and inability to maintain standing more than a few seconds with RW. Did well with squat pivot with modA of 1 and second person for safety. Left up in recliner with all needs met, chair alarm active and nursing staff aware of patient status.     Follow Up Recommendations  SNF;Supervision/Assistance - 24 hour     Equipment Recommendations  Other (comment) (defer to next venue)    Recommendations for Other Services       Precautions / Restrictions Precautions Precautions: Fall Precaution Comments: new L BKA Restrictions Weight Bearing Restrictions: Yes LLE Weight Bearing: Non weight bearing Other Position/Activity Restrictions: NWB, L BKA    Mobility  Bed Mobility Overal bed mobility: Needs Assistance Bed Mobility: Supine to Sit     Supine to sit: Mod assist     General bed mobility comments: able to walk BLEs off  EOB, really only needed ModA to come to full upright sitting position with max cues for sequencing  Transfers Overall transfer level: Needs assistance Equipment used: Rolling walker (2 wheeled) Transfers: Sit to/from [employer] Sit to Stand: Mod assist;+2 physical assistance   Squat pivot transfers: Mod assist;+2 safety/equipment      General transfer comment: performed multiple sit to stands from EOB with ModAx2, only able to stand a few seconds before fatiguing andR knee buckled; unable to perform STP, but successful with squat-pivot and ModA with second person for safety  Ambulation/Gait             General Gait Details: unable   Stairs             Wheelchair Mobility    Modified Rankin (Stroke Patients Only)       Balance Overall balance assessment: Needs assistance Sitting-balance support: Bilateral upper extremity supported;Feet supported Sitting balance-Leahy Scale: Fair Sitting balance - Comments: maintained midline siting wtih min guard   Standing balance support: Bilateral upper extremity supported;During functional activity Standing balance-Leahy Scale: Poor Standing balance comment: heavy reliance on RW and external support                            Cognition Arousal/Alertness: Awake/alert Behavior During Therapy: Flat affect Overall Cognitive Status: No family/caregiver present to determine baseline cognitive functioning Area of Impairment: Safety/judgement;Awareness;Problem solving;Following commands                       Following Commands: Follows one step commands consistently;Follows one step commands with increased time Safety/Judgement: Decreased awareness of safety;Decreased awareness of deficits Awareness: Intellectual Problem Solving: Difficulty sequencing;Requires verbal cues;Requires tactile cues General Comments: cognition a bit clearer today, able to follow commands more consistently but needed significant amount of processing time and repeated commands. Slow to initiate movement. Still intermittently does  not answer questions.      Exercises      General Comments        Pertinent Vitals/Pain Pain Assessment: Faces Faces Pain Scale: Hurts a little bit Pain Location: L LE Pain Descriptors / Indicators: Aching;Discomfort;Sore Pain  Intervention(s): Limited activity within patient's tolerance;Monitored during session;Repositioned    Home Living                      Prior Function            PT Goals (current goals can now be found in the care plan section) Acute Rehab PT Goals Patient Stated Goal: be able to go home PT Goal Formulation: With patient Time For Goal Achievement: 04/16/20 Potential to Achieve Goals: Fair Progress towards PT goals: Progressing toward goals    Frequency    Min 3X/week      PT Plan Current plan remains appropriate    Co-evaluation              AM-PAC PT "6 Clicks" Mobility   Outcome Measure  Help needed turning from your back to your side while in a flat bed without using bedrails?: A Little Help needed moving from lying on your back to sitting on the side of a flat bed without using bedrails?: A Lot Help needed moving to and from a bed to a chair (including a wheelchair)?: A Lot Help needed standing up from a chair using your arms (e.g., wheelchair or bedside chair)?: A Lot Help needed to walk in hospital room?: Total Help needed climbing 3-5 steps with a railing? : Total 6 Click Score: 11    End of Session Equipment Utilized During Treatment: Gait belt Activity Tolerance: Patient limited by fatigue Patient left: in chair;with call bell/phone within reach;with chair alarm set Nurse Communication: Mobility status;Need for lift equipment PT Visit Diagnosis: Muscle weakness (generalized) (M62.81);Difficulty in walking, not elsewhere classified (R26.2)     Time: 1340-1400 PT Time Calculation (min) (ACUTE ONLY): 20 min  Charges:  $Therapeutic Activity: 8-22 mins                     Windell Norfolk, DPT, PN1   Supplemental Physical Therapist [place]    Pager [phone] Acute Rehab Office [phone]

## 2020-04-05 NOTE — Progress Notes (Signed)
OT Cancellation    04/05/20 0700  OT Visit Information  Last OT Received On 04/05/20  Reason Eval/Treat Not Completed Patient at procedure or test/ unavailable (HD)  Maurie Boettcher, OT/L   Acute OT Clinical Specialist [place] Pager [phone] Office [phone]

## 2020-04-05 NOTE — Progress Notes (Signed)
[facility] Progress Note   Subjective:   Patient seen and examined at bedside in dialysis.  Reports some pain in stump today.  Otherwise doing ok.  Tolerating dialysis well.  Denies CP, SOB, n/v/d, abdominal pain and dizziness. Received 1 unit pRBC on HD today.   Objective Vitals:   04/05/20 0930 04/05/20 0940 04/05/20 0958 04/05/20 1037  BP: 130/67 130/67 120/62 (!) 117/59  Pulse: 67  68 65  Resp: 15  16 18   Temp:   98.6 F (37 C) 98.9 F (37.2 C)  TempSrc:   Oral Oral  SpO2:   98% 96%  Weight:   62.8 kg   Height:       Physical Exam General:chronically ill appearing male in NAD Heart:RRR Lungs:CTAB Abdomen:soft, NTND Extremities:L BKA - no stump edema, R no edema Dialysis Access: [facility] in use, R AVF +b/t   Filed Weights   04/04/20 1045 04/05/20 0645 04/05/20 0958  Weight: 62.9 kg 64.6 kg 62.8 kg    Intake/Output Summary (Last 24 hours) at 04/05/2020 1130 Last data filed at 04/05/2020 0958 Gross per 24 hour  Intake 792 ml  Output 2500 ml  Net -1708 ml    Additional Objective Labs: Basic Metabolic Panel: Recent Labs  Lab 04/03/20 0621 04/04/20 0422 04/05/20 0705  NA 136 136 136  K 4.1 4.2 3.4*  CL 101 99 100  CO2 25 24 27   GLUCOSE 131* 128* 173*  BUN 40* 48* 25*  CREATININE 5.04* 5.55* 3.56*  CALCIUM 8.2* 8.0* 7.8*  PHOS  --   --  4.2   Liver Function Tests: Recent Labs  Lab 03/31/20 0545 04/03/20 0621 04/05/20 0705  AST 12* 15  --   ALT 7 8  --   ALKPHOS 72 56  --   BILITOT 0.6 0.5  --   PROT 6.6 5.6*  --   ALBUMIN 2.2* 1.8* 1.6*   CBC: Recent Labs  Lab 04/01/20 0348 04/01/20 1214 04/02/20 0323 04/02/20 0323 04/03/20 0621 04/04/20 0422 04/05/20 0433  WBC 10.5  --  13.2*   < > 11.5* 9.3 8.0  NEUTROABS  --   --  10.5*  --   --   --  5.8  HGB 7.0*   < > 7.7*   < > 7.8* 7.8* 8.1*  HCT 23.8*   < > 25.1*   < > 25.4* 26.5* 26.6*  MCV 89.5  --  87.2  --  87.3 87.7 87.2  PLT 282  --  272   < > 323 352 433*   < > = values in  this interval not displayed.   Blood Culture    Component Value Date/Time   SDES BLOOD LEFT HAND 04/02/2020 2049   SPECREQUEST  04/02/2020 2049    BOTTLES DRAWN AEROBIC ONLY Blood Culture adequate volume   CULT  04/02/2020 2049    NO GROWTH 3 DAYS Performed at [facility], [place] [address], [place], [place] [address]    REPTSTATUS PENDING 04/02/2020 2049   CBG: Recent Labs  Lab 04/04/20 1636 04/04/20 1952 04/05/20 0035 04/05/20 0424 04/05/20 1029  GLUCAP 168* 134* 169* 139* 128*    Medications: . sodium chloride 10 mL/hr at 03/31/20 0735  . ferric gluconate (FERRLECIT/NULECIT) IV 125 mg (04/05/20 0832)   . sodium chloride   Intravenous Once  . acetaminophen      . aspirin EC  81 mg Oral Daily  . atorvastatin  80 mg Oral QHS  . Chlorhexidine Gluconate  Cloth  6 each Topical [id number]  . Chlorhexidine Gluconate Cloth  6 each Topical Q0600  . darbepoetin (ARANESP) injection - DIALYSIS  150 mcg Intravenous Q Tue-HD  . doxercalciferol  2 mcg Intravenous Q T,Th,Sa-HD  . feeding supplement (NEPRO CARB STEADY)  237 mL Oral BID BM  . feeding supplement (PROSource TF)  45 mL Per Tube BID  . finasteride  5 mg Oral Daily  . heparin  5,000 Units Subcutaneous Q8H  . heparin sodium (porcine)      . insulin aspart  0-9 Units Subcutaneous Q4H  . multivitamin  1 tablet Oral QHS  . oxyCODONE      . sevelamer carbonate  800 mg Oral TID WC    Dialysis Orders: [place] T,Th,S 4 hrs 180NRe 400/600 68.5 kg 2.0 K/2.25 Ca RIJ TDC/R AVF -Heparin 2000 units IV TIW -Hectorol 2 mcg IV TIW -Venofer 100 mg IV X 10 doses (1/10 doses given 1st dose 03/29/2020) -Venofer 50 mg IV weekly when Fe load completed -Mircera 150 mcg IV q 2 weeks (Last 03/23/2020)  Assessment/Plan: 1. LLE gangrene/PAD-  S/P L BKA Dr. Trula Slade 03/31/2020. Pain mostly well controlled. per primary/VVS. 2. ESRD - TTS via TDC.HD today per regular schedule. K 3.4, use ^K bath.  3. Hx orthostatic hypotension/HTN/volume  BP improved with HD today. Net UF goal 2.3L today. Under edw, post wt yesterday ~63kg, will need lower edw on d/c. 4. Anemia - HGB 8.1 today, 1 unit PRBC 9/24 and 1 unit pRBC with HD today.Continue ESA. Iron load restarted. 5. Metabolic bone disease - CCa and phos in goal. Continue binders, VDRA. 6. Nutrition -alb low 1.6Renal/Carb mod diet. Add protein supps, renal vits. High risk for inadequate intake  7. DMT2-per primary 8. Hx CVA 9. Disp. SNF vs HHPT   Jen Mow, PA-C [facility] 04/05/2020,11:30 AM  LOS: 4 days

## 2020-04-05 NOTE — Plan of Care (Signed)
  Problem: Activity: Goal: Ability to perform//tolerate increased activity and mobilize with assistive devices will improve Outcome: Progressing   Problem: Self-Care: Goal: Ability to meet self-care needs will improve Outcome: Progressing   Problem: Self-Concept: Goal: Ability to maintain and perform role responsibilities to the fullest extent possible will improve Outcome: Progressing   Problem: Pain Management: Goal: Pain level will decrease with appropriate interventions Outcome: Progressing   Problem: Skin Integrity: Goal: Demonstration of wound healing without infection will improve Outcome: Progressing   Problem: Nutrition: Goal: Adequate nutrition will be maintained Outcome: Progressing    Problem: Activity: Goal: Risk for activity intolerance will decrease Outcome: Progressing

## 2020-04-05 NOTE — Plan of Care (Signed)

## 2020-04-05 NOTE — Progress Notes (Signed)
  Progress Note    04/05/2020 7:51 AM 5 Days Post-Op  Subjective:  Seen in HD. Pain remains controlled except with movement of left leg   Vitals:   04/05/20 0645 04/05/20 0651  BP: 137/72 135/75  Pulse: 67 69  Resp: 16 15  Temp: 98.6 F (37 C)   SpO2: 99%    Physical Exam: Cardiac: regular rate and rhythm Lungs: non labored Incisions: left BKA stump site bandages clean, dry and intact Neurologic: alert and oriented  CBC    Component Value Date/Time   WBC 8.0 04/05/2020 0433   RBC 3.05 (L) 04/05/2020 0433   HGB 8.1 (L) 04/05/2020 0433   HGB 9.6 (L) 12/10/2019 1439   HCT 26.6 (L) 04/05/2020 0433   HCT 28.4 (L) 12/10/2019 1439   PLT 433 (H) 04/05/2020 0433   PLT 285 12/10/2019 1439   MCV 87.2 04/05/2020 0433   MCV 89 12/10/2019 1439   MCH 26.6 04/05/2020 0433   MCHC 30.5 04/05/2020 0433   RDW 15.2 04/05/2020 0433   RDW 14.5 12/10/2019 1439   LYMPHSABS 1.6 04/05/2020 0433   LYMPHSABS 2.0 02/24/2018 1142   MONOABS 0.5 04/05/2020 0433   EOSABS 0.0 04/05/2020 0433   EOSABS 0.1 02/24/2018 1142   BASOSABS 0.0 04/05/2020 0433   BASOSABS 0.0 02/24/2018 1142    BMET    Component Value Date/Time   NA 136 04/04/2020 0422   NA 137 12/10/2019 1439   K 4.2 04/04/2020 0422   CL 99 04/04/2020 0422   CO2 24 04/04/2020 0422   GLUCOSE 128 (H) 04/04/2020 0422   BUN 48 (H) 04/04/2020 0422   BUN 80 (HH) 12/10/2019 1439   CREATININE 5.55 (H) 04/04/2020 0422   CALCIUM 8.0 (L) 04/04/2020 0422   GFRNONAA 10 (L) 04/04/2020 0422   GFRAA 11 (L) 04/04/2020 0422    INR    Component Value Date/Time   INR 1.6 (H) 03/31/2020 0547     Intake/Output Summary (Last 24 hours) at 04/05/2020 0751 Last data filed at 04/05/2020 0500 Gross per 24 hour  Intake 477 ml  Output 2074 ml  Net -1597 ml     Assessment/Plan:  69 y.o. male is s/p left BKA 5 Days Post-Op. Pain well controlled. Bandages c/d/i. Was seen in HD. Was in HD yesterday and again today so will have one of my  colleagues take down dressings later today to assess stump    Karoline Caldwell, PA-C [facility] [phone] 04/05/2020 7:51 AM

## 2020-04-05 NOTE — Progress Notes (Signed)
   Subjective: Patient had no overnight events. Patient reports that he is having pain at his stump. Patient otherwise resting comfortably with stump elevated on pillow in HD bed.   Objective:  Vital signs in last 24 hours: Vitals:   04/05/20 0930 04/05/20 0940 04/05/20 0958 04/05/20 1037  BP: 130/67 130/67 120/62 (!) 117/59  Pulse: 67  68 65  Resp: 15  16 18   Temp:   98.6 F (37 C) 98.9 F (37.2 C)  TempSrc:   Oral Oral  SpO2:   98% 96%  Weight:   62.8 kg   Height:      Physical Exam Constitutional:      Appearance: Normal appearance.  Cardiovascular:     Rate and Rhythm: Normal rate and regular rhythm.  Pulmonary:     Effort: Pulmonary effort is normal.     Breath sounds: Normal breath sounds.  Abdominal:     General: Bowel sounds are normal.     Palpations: Abdomen is soft.     Tenderness: There is no abdominal tenderness.  Musculoskeletal:     Comments: Tender to palpation of the L stump area. Wrap appeared clean  Skin:    General: Skin is warm and dry.  Neurological:     Mental Status: He is alert.     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 [employer] has been contacted for stump sock. Patient recommended to follow up in 4-5 weeks for staple removal. OT and PT will continue to see patient as TOC looks for SNF.  - Pain management was Oxycodone as this is not suggested for an ESRD patient we will transition to Dilaudid 0.5mg  IV q 12 PRN.   ESRD on HD TTS: K+ 3.4 this AM. WBC 8.0. Hgb 8.1. Patient received 1UpRBCs to optimize HD this AM.  -Nephrology consulted, appreciate assistance. Received HD today, due to not receiving HD on Saturday.  -Monitor renal functiondaily  Normocytic  hyperproliferative Anemia:  Status post 2 unit PRBC (9/24, 9/28). Hgb 9.1>7>7.7>7.8>7.8>8.1.   - Transfused at HD this AM - Goal Hgb of 10-12 once stable. - Transfusion goal of < 7.  Prior CVA: -home ASA -Holding homePlavix -home atorvastatin 80 mg daily  History of hypertension: History of orthostatic hypotension: BP range 117/59-162/90. Hypertensive only during HD.  Continue to hold antihypertensive medications.  DM: Diet controlled, last A1c was 6.4. -Will monitor CBGsand SSI.   Prior to Admission Living Arrangement: Home Anticipated Discharge Location: SNF Barriers to Discharge: Dispo Dispo: Anticipated discharge in approximately 1-2 day(s).   Freida Busman, MD 04/05/2020, 2:04 PM Pager: [phone] After 5pm on weekdays and 1pm on weekends: On Call pager [phone]

## 2020-04-06 LAB — BPAM RBC
Blood Product Expiration Date: 202110202359
ISSUE DATE / TIME: 202109280728
Unit Type and Rh: 8400

## 2020-04-06 LAB — CBC WITH DIFFERENTIAL/PLATELET
Abs Immature Granulocytes: 0.05 10*3/uL (ref 0.00–0.07)
Basophils Absolute: 0.1 10*3/uL (ref 0.0–0.1)
Basophils Relative: 1 %
Eosinophils Absolute: 0 10*3/uL (ref 0.0–0.5)
Eosinophils Relative: 0 %
HCT: 32.6 % — ABNORMAL LOW (ref 39.0–52.0)
Hemoglobin: 9.8 g/dL — ABNORMAL LOW (ref 13.0–17.0)
Immature Granulocytes: 1 %
Lymphocytes Relative: 20 %
Lymphs Abs: 2 10*3/uL (ref 0.7–4.0)
MCH: 26.3 pg (ref 26.0–34.0)
MCHC: 30.1 g/dL (ref 30.0–36.0)
MCV: 87.4 fL (ref 80.0–100.0)
Monocytes Absolute: 0.6 10*3/uL (ref 0.1–1.0)
Monocytes Relative: 6 %
Neutro Abs: 7.3 10*3/uL (ref 1.7–7.7)
Neutrophils Relative %: 72 %
Platelets: 449 10*3/uL — ABNORMAL HIGH (ref 150–400)
RBC: 3.73 MIL/uL — ABNORMAL LOW (ref 4.22–5.81)
RDW: 15.4 % (ref 11.5–15.5)
WBC: 10.1 10*3/uL (ref 4.0–10.5)
nRBC: 0 % (ref 0.0–0.2)

## 2020-04-06 LAB — GLUCOSE, CAPILLARY
Glucose-Capillary: 145 mg/dL — ABNORMAL HIGH (ref 70–99)
Glucose-Capillary: 151 mg/dL — ABNORMAL HIGH (ref 70–99)
Glucose-Capillary: 158 mg/dL — ABNORMAL HIGH (ref 70–99)
Glucose-Capillary: 171 mg/dL — ABNORMAL HIGH (ref 70–99)

## 2020-04-06 LAB — SARS CORONAVIRUS 2 BY RT PCR (HOSPITAL ORDER, PERFORMED IN ~~LOC~~ HOSPITAL LAB): SARS Coronavirus 2: NEGATIVE

## 2020-04-06 LAB — TYPE AND SCREEN
ABO/RH(D): AB POS
Antibody Screen: NEGATIVE
Unit division: 0

## 2020-04-06 MED ORDER — HYDROMORPHONE HCL 2 MG PO TABS
2.0000 mg | ORAL_TABLET | Freq: Four times a day (QID) | ORAL | 0 refills | Status: DC | PRN
Start: 2020-04-06 — End: 2020-05-10

## 2020-04-06 MED ORDER — CHLORHEXIDINE GLUCONATE CLOTH 2 % EX PADS: 6.0000 | MEDICATED_PAD | Freq: Every day | CUTANEOUS | Status: DC

## 2020-04-06 MED ORDER — HYDROMORPHONE HCL 2 MG PO TABS
2.0000 mg | ORAL_TABLET | Freq: Four times a day (QID) | ORAL | 0 refills | Status: DC | PRN
Start: 2020-04-06 — End: 2020-04-06

## 2020-04-06 NOTE — Social Work (Signed)
SW spoke to pt's ESAU, FRIDMAN [phone], who reports aware of current SNF offers [facility]) and is requesting [facility]. SO to notify [facility] of need for HD transport from SNF tomorrow. Notified Juliann Pulse in [facility] admissions who confirmed they are prepared to admit pt today pending updated negative COVID result. [facility] auth request submitted (ref # [id number]).   Wandra Feinstein, MSW, LCSW [phone] (coverage)

## 2020-04-06 NOTE — Discharge Summary (Signed)
Name: Dennis Macias MRN: [id number] DOB: Jul 19, 1950 69 y.o. PCP: [name], MD  Date of Admission: 03/31/2020  5:30 AM Date of Discharge: 04/06/20 Attending Physician: Dr. Evette Doffing, MD FACP  Discharge Diagnosis: Principal Problem:   S/P BKA (below knee amputation) ([place]) Active Problems:   Diabetes mellitus with retinopathy of both eyes ([place])   Essential hypertension   ESRD (end stage renal disease) ([place])    Discharge Medications: Allergies as of 04/06/2020      Reactions   Cefepime Other (See Comments)   Pt had BAD encephalopathy from Cefepime      Medication List    STOP taking these medications   clopidogrel 75 MG tablet Commonly known as: PLAVIX   oxyCODONE-acetaminophen 5-325 MG tablet Commonly known as: PERCOCET/ROXICET     TAKE these medications   Accu-Chek FastClix Lancets Misc Check blood sugar up to 7 times a week as instructed   Accu-Chek Guide test strip Generic drug: glucose blood Check blood sugar up to 7 times a week as instructed   Accu-Chek Guide w/Device Kit 1 each by Does not apply route daily. Check blood sugar as instructed up to 7 times a week   acetaminophen 500 MG tablet Commonly known as: TYLENOL Take 500 mg by mouth every 6 (six) hours as needed for moderate pain or headache.   aspirin EC 81 MG tablet Take 1 tablet (81 mg total) by mouth daily.   atorvastatin 80 MG tablet Commonly known as: LIPITOR Take 1 tablet (80 mg total) by mouth at bedtime. IM program   dorzolamide-timolol 22.3-6.8 MG/ML ophthalmic solution Commonly known as: COSOPT Place 1 drop into the right eye 2 (two) times daily.   finasteride 5 MG tablet Commonly known as: PROSCAR Take 1 tablet (5 mg total) by mouth daily.   HYDROmorphone 2 MG tablet Commonly known as: Dilaudid Take 1 tablet (2 mg total) by mouth every 6 (six) hours as needed for up to 12 doses for severe pain.   polyethylene glycol 17 g packet Commonly known as: MIRALAX / GLYCOLAX Take 17  g by mouth daily as needed for moderate constipation.   sevelamer carbonate 800 MG tablet Commonly known as: RENVELA Take 800 mg by mouth See admin instructions. Take 800 mg by mouth three times a day with food on Sun/Mon/Wed/Fri and two times a day on Tues/Thurs/Sat   vitamin B-12 1000 MCG tablet Commonly known as: CYANOCOBALAMIN Take 1 tablet (1,000 mcg total) by mouth daily.            Discharge Care Instructions  (From admission, onward)         Start     Ordered   04/06/20 0000  Change dressing (specify)       Comments: Dressing change: Change dressing ever other day. Will need stables removed.   04/06/20 1543          Disposition and follow-up:   Mr.Davari Kirks was discharged from [facility] in Stable condition.  At the hospital follow up visit please address:  1.  Follow-up:  A. BKA - follow up set up with vascular surgery, continue ASA 81 mg per vascular surgery. Will need to be re-evaluated at SNF for additional pain medicine.   B. Diabetes - consider starting a low dose sliding scale insulin.  2.  Labs / imaging needed at time of follow-up: CBC and CMP  3.  Pending labs/ test needing follow-up: none  Follow-up Appointments:  Contact information for follow-up providers  Serafina Mitchell, MD In 4 weeks.   Specialties: Vascular Surgery, Cardiology Why: Office will call you to arrange your appt (sent) Contact information: [address] [place] [address] [phone]            Contact information for after-discharge care    Destination    [facility] .   Service: Skilled Nursing Contact information: [address] [place] [facility] Course by problem list: 1. Left Lower Extremity PAD with gangrene s/p BKA  - Patient presented with Left lower extremity wound secondary to severe PAD. Patient is s/p right femoral artery but continued to  have left foot deterioration, new wound and significant pain. Patient underwent BKA during this admission without complication. He developed a mild fever after his surgery the resolved without intervention. Patient was discharged home on ASA 81 mg per vascular surgeries recommendations.   2. ESRD - patient has ESRD with HD on TTS. Nephrology was consulted and inpatient HD was performed without complication. He will be discharged to his home dialysis center.   3. Prior CVA - patient was originally on DAPT with asa 81 mg and plavix 75 mg. His asa was resumed during this hospitalization. He was continued on atorvastatin 80 mg daily.   Discharge Vitals:   BP 126/68 (BP Location: Right Arm)   Pulse 62   Temp 98.4 F (36.9 C) (Oral)   Resp 15   Ht 6' (1.829 m)   Wt 62.8 kg   SpO2 98%   BMI 18.78 kg/m   Pertinent Labs, Studies, and Procedures:  CBC Latest Ref Rng & Units 04/06/2020 04/05/2020 04/04/2020  WBC 4.0 - 10.5 K/uL 10.1 8.0 9.3  Hemoglobin 13.0 - 17.0 g/dL 9.8(L) 8.1(L) 7.8(L)  Hematocrit 39 - 52 % 32.6(L) 26.6(L) 26.5(L)  Platelets 150 - 400 K/uL 449(H) 433(H) 352    CMP Latest Ref Rng & Units 04/05/2020 04/04/2020 04/03/2020  Glucose 70 - 99 mg/dL 173(H) 128(H) 131(H)  BUN 8 - 23 mg/dL 25(H) 48(H) 40(H)  Creatinine 0.61 - 1.24 mg/dL 3.56(H) 5.55(H) 5.04(H)  Sodium 135 - 145 mmol/L 136 136 136  Potassium 3.5 - 5.1 mmol/L 3.4(L) 4.2 4.1  Chloride 98 - 111 mmol/L 100 99 101  CO2 22 - 32 mmol/L _0 Calcium 8.9 - 10.3 mg/dL 7.8(L) 8.0(L) 8.2(L)  Total Protein 6.5 - 8.1 g/dL - - 5.6(L)  Total Bilirubin 0.3 - 1.2 mg/dL - - 0.5  Alkaline Phos 38 - 126 U/L - - 56  AST 15 - 41 U/L - - 15  ALT 0 - 44 U/L - - 8    No results found.   Discharge Instructions: Discharge Instructions    Change dressing (specify)   Complete by: As directed    Dressing change: Change dressing ever other day. Will need stables removed.   Diet - low sodium heart healthy   Complete by: As directed      Discharge instructions   Complete by: As directed    You were hospitalized for Surgical procedure. Thank you for allowing [place] to be part of your care.    Please follow up with the following providers: 1. PCP in 1 week 2. Vascular Surgery in 4 weeks  Please note these changes made to your medications:   - Medications to continue: please see discharge summary.  - Medications to discontinue: Plavix  75 mg  Please make sure to follow up with your providers, continue taking Asprin 81 mg, and be reevaluated for your diabetes.   Please call our clinic if you have any questions or concerns, we may be able to help and keep you from a long and expensive emergency room wait. Our clinic and after hours phone number is [phone], the best time to call is Monday through Friday 9 am to 4 pm but there is always someone available 24/7 if you have an emergency. If you need medication refills please notify your pharmacy one week in advance and they will send [place] a request.   Increase activity slowly   Complete by: As directed       Signed: Marianna Payment, MD 04/06/2020, 3:48 PM   Pager: [phone]

## 2020-04-06 NOTE — TOC Transition Note (Signed)
Transition of Care [facility]) - CM/SW Discharge Note   Patient Details  Name: Dennis Macias MRN: [id number] Date of Birth: 11-10-1950  Transition of Care [facility]) CM/SW Contact:  Amador Cunas, LCSW Phone Number: 04/06/2020, 4:54 PM   Clinical Narrative:   Pt to dc to [facility], room 211. Pt and Pt's SO aware of dc. Pt's SO to notify [facility] of need for HD transport tomorrow. Packet complete and RN provided with number for report. SW signing off at dc.   Wandra Feinstein, MSW, LCSW [phone] (coverage)       Final next level of care: Skilled Nursing Facility Barriers to Discharge: No Barriers Identified   Patient Goals and CMS Choice Patient states their goals for this hospitalization and ongoing recovery are:: "be able to get around" CMS Medicare.gov Compare Post Acute Care list provided to:: Patient (Significant other) Choice offered to / list presented to : Patient  Discharge Placement              Patient chooses bed at: [facility] Patient to be transferred to facility by: [facility] Name of family member notified: Dennis Macias, SO Patient and family notified of of transfer: 04/06/20  Discharge Plan and Services     Post Acute Care Choice: [place]                               Social Determinants of Health (SDOH) Interventions     Readmission Risk Interventions Readmission Risk Prevention Plan 01/19/2020 11/10/2019 08/10/2019  Transportation Screening Complete Complete Complete  PCP or Specialist Appt within 3-5 Days - - Complete  [place]RI or [place] - - Complete  Social Work Consult for [place] Planning/Counseling - - Complete  Palliative Care Screening - - Complete  Medication Review Press photographer) Complete Complete Complete  PCP or Specialist appointment within 3-5 days of discharge Complete - -  [place] or Home Care Consult Complete Complete -  SW Recovery Care/Counseling Consult Complete Complete -  Palliative Care  Screening Complete Not Applicable -  [place] Complete Complete -  Some recent data might be hidden

## 2020-04-06 NOTE — Progress Notes (Signed)
PT Cancellation Note  Patient Details Name: Dennis Macias MRN: [id number] DOB: Jul 18, 1950   Cancelled Treatment:    Reason Eval/Treat Not Completed: Patient declined, no reason specified patient in bed, intermittently going between answering therapist and closing his eyes/ignoring therapist. Definitely not excited to work with therapy, eventually states he's not going to do anything today. Will continue to follow acutely.    Windell Norfolk, DPT, PN1   Supplemental Physical Therapist [facility]    Pager [phone] Acute Rehab Office [phone]

## 2020-04-06 NOTE — Progress Notes (Signed)
Report given to [facility] nurse at [facility], All questions and concerns were answered satisfactorily.

## 2020-04-06 NOTE — Plan of Care (Signed)

## 2020-04-06 NOTE — Plan of Care (Signed)
°  Problem: Education: °Goal: Knowledge of General Education information will improve °Description: Including pain rating scale, medication(s)/side effects and non-pharmacologic comfort measures °Outcome: Progressing °  °Problem: Clinical Measurements: °Goal: Will remain free from infection °Outcome: Progressing °  °Problem: Activity: °Goal: Risk for activity intolerance will decrease °Outcome: Progressing °  °Problem: Pain Managment: °Goal: General experience of comfort will improve °Outcome: Progressing °  °

## 2020-04-06 NOTE — Progress Notes (Addendum)
   Subjective: Patient had no overnight events. Patient has no reports of pain this AM. His BKA is doing well.   Objective:  Vital signs in last 24 hours: Vitals:   04/05/20 1514 04/05/20 1942 04/06/20 0549 04/06/20 0737  BP: (!) 111/59 (!) 94/58 138/73 124/65  Pulse: 75 66 72 (!) 57  Resp: 18 17  15   Temp: 98.7 F (37.1 C) 98.4 F (36.9 C) 98.2 F (36.8 C) 98.8 F (37.1 C)  TempSrc: Oral Oral Oral Oral  SpO2: 99% 99% 95% 99%  Weight:      Height:       Physical Exam Constitutional:      Appearance: Normal appearance.  Cardiovascular:     Rate and Rhythm: Normal rate and regular rhythm.  Pulmonary:     Effort: Pulmonary effort is normal.     Breath sounds: Normal breath sounds.  Abdominal:     General: Bowel sounds are normal.     Palpations: Abdomen is soft.     Tenderness: There is no abdominal tenderness.  Musculoskeletal:     Comments: L BKA is wrapped.  Neurological:     Mental Status: He is alert.  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  BKA of L leg due to PAD He overall appeared well and a bit more vocal than he has been over the past few days. Patient does have IV Dilaudid 0.5mg  BID PRN available. He did have 1 dose yesterday. LLE was wrapped well this AM. - Continue PT/OT - Aspirin 81mg  daily - Atorvastatin 80mg  daily   ESRD on HD TTS: ESRD next scheduled 9/30.  -Nephrology consulted, appreciate assistance. -Monitor renal function daily   Normocytic hyperproliferative  Anemia:  Status post 2 unit PRBC (9/24, 9/28). Hgb 9.1>7>7.7>7.8>7.8>8.1.   - Goal Hgb of 10-12 once stable. - Transfusion goal of < 7.   Prior CVA: -home ASA -home atorvastatin 80 mg daily     Prior to Admission Living Arrangement: Home Anticipated Discharge Location: SNF Barriers to Discharge: Dispo Dispo: Anticipated discharge in approximately 1-2 day(s).   Freida Busman, MD 04/06/2020, 11:13 AM Pager: [phone] After 5pm on weekdays and 1pm on weekends: On Call pager [phone]

## 2020-04-06 NOTE — Progress Notes (Signed)
    Subjective  - s/p left BKA  Still with pain   Physical Exam:  Stump dressing intact       Assessment/Plan:    [employer] placing stump sock and will get limb guard Needs pain control dispo currently SNF, but would prefer CIR  Wells Chee Kinslow 04/06/2020 7:39 AM --  Vitals:   04/06/20 0549 04/06/20 0737  BP: 138/73 124/65  Pulse: 72 (!) 57  Resp:  15  Temp: 98.2 F (36.8 C) 98.8 F (37.1 C)  SpO2: 95% 99%    Intake/Output Summary (Last 24 hours) at 04/06/2020 0739 Last data filed at 04/06/2020 0300 Gross per 24 hour  Intake 665 ml  Output 2300 ml  Net -1635 ml     Laboratory CBC    Component Value Date/Time   WBC 8.0 04/05/2020 0433   HGB 8.1 (L) 04/05/2020 0433   HGB 9.6 (L) 12/10/2019 1439   HCT 26.6 (L) 04/05/2020 0433   HCT 28.4 (L) 12/10/2019 1439   PLT 433 (H) 04/05/2020 0433   PLT 285 12/10/2019 1439    BMET    Component Value Date/Time   NA 136 04/05/2020 0705   NA 137 12/10/2019 1439   K 3.4 (L) 04/05/2020 0705   CL 100 04/05/2020 0705   CO2 27 04/05/2020 0705   GLUCOSE 173 (H) 04/05/2020 0705   BUN 25 (H) 04/05/2020 0705   BUN 80 (HH) 12/10/2019 1439   CREATININE 3.56 (H) 04/05/2020 0705   CALCIUM 7.8 (L) 04/05/2020 0705   GFRNONAA 17 (L) 04/05/2020 0705   GFRAA 19 (L) 04/05/2020 0705    COAG Lab Results  Component Value Date   INR 1.6 (H) 03/31/2020   INR 1.5 (H) 02/17/2020   INR 1.9 (H) 01/14/2020   No results found for: PTT  Antibiotics Anti-infectives (From admission, onward)   Start     Dose/Rate Route Frequency Ordered Stop   03/31/20 0544  vancomycin (VANCOCIN) IVPB 1000 mg/200 mL premix        1,000 mg 200 mL/hr over 60 Minutes Intravenous 60 min pre-op 03/31/20 0544 03/31/20 0738       V. Leia Alf, M.D., [facility] [place] Office: [phone] Pager:  [phone]

## 2020-04-06 NOTE — Progress Notes (Signed)
[facility] Progress Note   Subjective:   Patient seen and examined at bedside.  No new complaints.  Reports pain currently well controlled. Denies SOB, CP, n/v/d, abdominal pain, weakness and fatigue.   Objective Vitals:   04/05/20 1514 04/05/20 1942 04/06/20 0549 04/06/20 0737  BP: (!) 111/59 (!) 94/58 138/73 124/65  Pulse: 75 66 72 (!) 57  Resp: 18 17  15   Temp: 98.7 F (37.1 C) 98.4 F (36.9 C) 98.2 F (36.8 C) 98.8 F (37.1 C)  TempSrc: Oral Oral Oral Oral  SpO2: 99% 99% 95% 99%  Weight:      Height:       Physical Exam  General:chronically ill appearing male in NAD Heart:RRR Lungs:CTAB Abdomen:soft, NTND Extremities:L BKA - no stump edema, RLE no edema Dialysis Access: [facility], RU AVF +b   Filed Weights   04/04/20 1045 04/05/20 0645 04/05/20 0958  Weight: 62.9 kg 64.6 kg 62.8 kg    Intake/Output Summary (Last 24 hours) at 04/06/2020 1245 Last data filed at 04/06/2020 0900 Gross per 24 hour  Intake 590 ml  Output --  Net 590 ml    Additional Objective Labs: Basic Metabolic Panel: Recent Labs  Lab 04/03/20 0621 04/04/20 0422 04/05/20 0705  NA 136 136 136  K 4.1 4.2 3.4*  CL 101 99 100  CO2 25 24 27   GLUCOSE 131* 128* 173*  BUN 40* 48* 25*  CREATININE 5.04* 5.55* 3.56*  CALCIUM 8.2* 8.0* 7.8*  PHOS  --   --  4.2   Liver Function Tests: Recent Labs  Lab 03/31/20 0545 04/03/20 0621 04/05/20 0705  AST 12* 15  --   ALT 7 8  --   ALKPHOS 72 56  --   BILITOT 0.6 0.5  --   PROT 6.6 5.6*  --   ALBUMIN 2.2* 1.8* 1.6*   CBC: Recent Labs  Lab 04/02/20 0323 04/02/20 0323 04/03/20 0621 04/03/20 0621 04/04/20 0422 04/05/20 0433 04/06/20 1035  WBC 13.2*   < > 11.5*   < > 9.3 8.0 10.1  NEUTROABS 10.5*  --   --   --   --  5.8 7.3  HGB 7.7*   < > 7.8*   < > 7.8* 8.1* 9.8*  HCT 25.1*   < > 25.4*   < > 26.5* 26.6* 32.6*  MCV 87.2  --  87.3  --  87.7 87.2 87.4  PLT 272   < > 323   < > 352 433* 449*   < > = values in this interval not  displayed.   Blood Culture    Component Value Date/Time   SDES BLOOD LEFT HAND 04/02/2020 2049   SPECREQUEST  04/02/2020 2049    BOTTLES DRAWN AEROBIC ONLY Blood Culture adequate volume   CULT  04/02/2020 2049    NO GROWTH 4 DAYS Performed at [facility], [place] [address] [place], [place] [address]    REPTSTATUS PENDING 04/02/2020 2049    CBG: Recent Labs  Lab 04/05/20 1705 04/05/20 2009 04/06/20 0007 04/06/20 0547 04/06/20 1125  GLUCAP 179* 196* 171* 145* 158*   Medications: . sodium chloride 10 mL/hr at 03/31/20 0735  . ferric gluconate (FERRLECIT/NULECIT) IV 125 mg (04/05/20 0832)   . sodium chloride   Intravenous Once  . aspirin EC  81 mg Oral Daily  . atorvastatin  80 mg Oral QHS  . Chlorhexidine Gluconate Cloth  6 each Topical Q0600  . Chlorhexidine Gluconate Cloth  6 each Topical Q0600  .  darbepoetin (ARANESP) injection - DIALYSIS  150 mcg Intravenous Q Tue-HD  . doxercalciferol  2 mcg Intravenous Q T,Th,Sa-HD  . feeding supplement (NEPRO CARB STEADY)  237 mL Oral BID BM  . feeding supplement (PROSource TF)  45 mL Per Tube BID  . finasteride  5 mg Oral Daily  . heparin  5,000 Units Subcutaneous Q8H  . insulin aspart  0-9 Units Subcutaneous Q4H  . multivitamin  1 tablet Oral QHS  . sevelamer carbonate  800 mg Oral TID WC    Dialysis Orders: Spray T,Th,S 4 hrs 180NRe 400/600 68.5 kg 2.0 K/2.25 Ca RIJ TDC/R AVF -Heparin 2000 units IV TIW -Hectorol 2 mcg IV TIW -Venofer 100 mg IV X 10 doses (1/10 doses given 1st dose 03/29/2020) -Venofer 50 mg IV weekly when Fe load completed -Mircera 150 mcg IV q 2 weeks (Last 03/23/2020)  Assessment/Plan: 1. LLE gangrene/PAD-S/P L BKA Dr. Trula Slade 03/31/2020.Pain mostly well controlled. per primary/VVS. 2. ESRD - TTS via TDC.HD tomorrow per regular schedule.  Can be completed at OP unit if d/c today. 3. Hx orthostatic hypotension/HTN/volume BP well controlled today. Net UF goal 2.3L yesterday. Under edw,  post wt ~63kg, will need lower edw on d/c. 4. Anemia - Hgb^9.8 s/p 1 unit PRBC 9/24 and 1 unit pRBC with HD 9/28.Continue ESA and iron load. 5. Metabolic bone disease -CCa and phos in goal.Continue binders, VDRA. 6. Nutrition -alb low 1.6Renal/Carb mod diet. Add protein supps, renal vits. High risk for inadequate intake  7. DMT2-per primary 8. Hx CVA 9. Disp - ok to d/c from renal standpoint, may d/c today.   Jen Mow, PA-C [facility] 04/06/2020,12:45 PM  LOS: 5 days

## 2020-04-06 NOTE — Progress Notes (Addendum)
Discharge summary packet/pertinent documents provided to [facility] staff. Pt alert/oriented in no apparent distress. D/C to [facility] as ordered.Dennis Macias has been informed of transfer.

## 2020-04-06 NOTE — Progress Notes (Signed)
OT Cancellation Note  Patient Details Name: Dennis Macias MRN: [id number] DOB: [birth year]   Cancelled Treatment:    Reason Eval/Treat Not Completed: Patient declined, no reason specified. Attempted OT session x 2. Initially, pt reporting "I'll do whatever you want me to", attempted to get EOB and then closed eyes, refusing to respond to therapist. Pt eventually reported "I don't want to do anything today."  Layla Maw 04/06/2020, 2:39 PM

## 2020-04-07 LAB — CULTURE, BLOOD (ROUTINE X 2)
Culture: NO GROWTH
Culture: NO GROWTH
Special Requests: ADEQUATE
Special Requests: ADEQUATE

## 2020-04-22 ENCOUNTER — Other Ambulatory Visit (HOSPITAL_COMMUNITY): Payer: Self-pay | Admitting: Family Medicine

## 2020-04-26 ENCOUNTER — Other Ambulatory Visit (HOSPITAL_COMMUNITY): Payer: Self-pay | Admitting: Family Medicine

## 2020-04-26 MED FILL — SEVELAMER CARBONATE 800 MG: 800 | 35 days supply | Qty: 90 | Fill #0

## 2020-04-26 MED FILL — DORZOLAMIDE-TIMOLOL EYE DRP: 22.3-6.8 | 100 days supply | Qty: 10 | Fill #0

## 2020-04-26 MED FILL — OXYCODONE-APAP 5-325MG: 5-325 | 7 days supply | Qty: 20 | Fill #0

## 2020-04-29 ENCOUNTER — Ambulatory Visit: Payer: Medicare PPO | Admitting: *Deleted

## 2020-04-29 ENCOUNTER — Telehealth: Payer: Self-pay | Admitting: Internal Medicine

## 2020-04-29 DIAGNOSIS — N186 End stage renal disease: Secondary | ICD-10-CM

## 2020-04-29 DIAGNOSIS — I2510 Atherosclerotic heart disease of native coronary artery without angina pectoris: Secondary | ICD-10-CM

## 2020-04-29 DIAGNOSIS — I5042 Chronic combined systolic (congestive) and diastolic (congestive) heart failure: Secondary | ICD-10-CM

## 2020-04-29 DIAGNOSIS — I10 Essential (primary) hypertension: Secondary | ICD-10-CM

## 2020-04-29 DIAGNOSIS — E785 Hyperlipidemia, unspecified: Secondary | ICD-10-CM

## 2020-04-29 DIAGNOSIS — E11319 Type 2 diabetes mellitus with unspecified diabetic retinopathy without macular edema: Secondary | ICD-10-CM

## 2020-04-29 NOTE — Telephone Encounter (Signed)
I agree. Thank you.

## 2020-04-29 NOTE — Telephone Encounter (Signed)
Returned call to Shanon Brow, PT with [name]. Verbal auth given for 2 week 6 and 1 week 2 to work on strength, safety, balance, transfers, endurance, and to prepare patient for prosthetic limb. Will route to PCP for agreement/denial. Hubbard Hartshorn, RN, BSN

## 2020-04-29 NOTE — Chronic Care Management (AMB) (Signed)
  Chronic Care Management   Note  04/29/2020 Name: Dennis Macias MRN: [id number] DOB: [birth year]  Successful outreach to patient's significant other , Dixon Boos, Explained purpose of call, to complete follow up chronic care management assessment. [name] states patient returned home from short term rehabilitation at [facility] on 10/18 after undergoing left below the amputation for ischemic left foot ulcers on 03/31/20. [name] says patient is currently receiving home physical therapy from [facility] physical therapist and is not able to talk at this time. She says he is doing well in general. Advised [name] this CCM RN will call back next week to complete follow up assessment.  Upon chart review patient after ending call with [name], patient has follow up appointment  with his  Surgeon, Dr Trula Slade,  on Monday 05/02/20.   Follow up plan: Telephone follow up appointment with care management team member scheduled for:05/03/20 at 4:00 pm.  Kelli Churn RN, CCM, [facility] RN Care Manager [phone]

## 2020-04-29 NOTE — Telephone Encounter (Signed)
[facility] PT requesting VO  To contiute services for the following:  2 X 1 for 6 wks 1 X 1 for 2 wks

## 2020-05-02 ENCOUNTER — Encounter: Payer: Self-pay | Admitting: *Deleted

## 2020-05-02 ENCOUNTER — Ambulatory Visit (INDEPENDENT_AMBULATORY_CARE_PROVIDER_SITE_OTHER): Payer: Self-pay | Admitting: Surgery

## 2020-05-02 ENCOUNTER — Other Ambulatory Visit: Payer: Self-pay

## 2020-05-02 ENCOUNTER — Encounter: Payer: Self-pay | Admitting: Internal Medicine

## 2020-05-02 ENCOUNTER — Ambulatory Visit (INDEPENDENT_AMBULATORY_CARE_PROVIDER_SITE_OTHER): Payer: Medicare PPO | Admitting: Internal Medicine

## 2020-05-02 VITALS — BP 129/67 | HR 71 | Temp 98.6°F | Resp 20 | Ht 72.0 in | Wt 137.0 lb

## 2020-05-02 VITALS — BP 122/70 | HR 79 | Temp 98.7°F

## 2020-05-02 DIAGNOSIS — N3000 Acute cystitis without hematuria: Secondary | ICD-10-CM

## 2020-05-02 DIAGNOSIS — R1084 Generalized abdominal pain: Secondary | ICD-10-CM

## 2020-05-02 DIAGNOSIS — I7025 Atherosclerosis of native arteries of other extremities with ulceration: Secondary | ICD-10-CM

## 2020-05-02 NOTE — Patient Instructions (Signed)
Dennis Macias,  Sorry to hear about your abdominal pain.   I am checking to see if you have an infection in your bladder causing the pain and issues with emptying your bladder.   I will let you know once I have these results.   Take care!

## 2020-05-02 NOTE — Progress Notes (Signed)
Patient name: Dennis Macias MRN: [id number] DOB: 10/22/1950 Sex: male  REASON FOR VISIT:    Postop  HISTORY OF PRESENT ILLNESS:   Dennis Macias is a 68 y.o. male who presented with a left toe ulcer.  He underwent percutaneous intervention on 8 3 2021.  Despite revascularization, his foot deteriorated.  He underwent left below-knee amputation on 923 2021.  He has a new blister on his right fifth toe  CURRENT MEDICATIONS:    Current Outpatient Medications  Medication Sig Dispense Refill  . Accu-Chek FastClix Lancets MISC Check blood sugar up to 7 times a week as instructed 102 each 3  . acetaminophen (TYLENOL) 500 MG tablet Take 500 mg by mouth every 6 (six) hours as needed for moderate pain or headache.    Marland Kitchen aspirin EC 81 MG tablet Take 1 tablet (81 mg total) by mouth daily.    Marland Kitchen atorvastatin (LIPITOR) 80 MG tablet Take 1 tablet (80 mg total) by mouth at bedtime. IM program 90 tablet 3  . Blood Glucose Monitoring Suppl (ACCU-CHEK GUIDE) w/Device KIT 1 each by Does not apply route daily. Check blood sugar as instructed up to 7 times a week 1 kit 0  . dorzolamide-timolol (COSOPT) 22.3-6.8 MG/ML ophthalmic solution Place 1 drop into the right eye 2 (two) times daily. 10 mL 10  . finasteride (PROSCAR) 5 MG tablet Take 1 tablet (5 mg total) by mouth daily. 90 tablet 1  . glucose blood (ACCU-CHEK GUIDE) test strip Check blood sugar up to 7 times a week as instructed 100 each 3  . polyethylene glycol (MIRALAX / GLYCOLAX) 17 g packet Take 17 g by mouth daily as needed for moderate constipation.     . sevelamer carbonate (RENVELA) 800 MG tablet Take 800 mg by mouth See admin instructions. Take 800 mg by mouth three times a day with food on Sun/Mon/Wed/Fri and two times a day on Tues/Thurs/Sat    . vitamin B-12 (CYANOCOBALAMIN) 1000 MCG tablet Take 1 tablet (1,000 mcg total) by mouth daily. 30 tablet 0  . HYDROmorphone (DILAUDID) 2 MG tablet Take 1 tablet (2 mg total)  by mouth every 6 (six) hours as needed for up to 12 doses for severe pain. (Patient not taking: Reported on 05/02/2020) 30 tablet 0   No current facility-administered medications for this visit.    REVIEW OF SYSTEMS:   [X]  denotes positive finding, [ ]  denotes negative finding Cardiac  Comments:  Chest pain or chest pressure:    Shortness of breath upon exertion:    Short of breath when lying flat:    Irregular heart rhythm:    Constitutional    Fever or chills:      PHYSICAL EXAM:   Vitals:   05/02/20 0835  BP: 129/67  Pulse: 71  Resp: 20  Temp: 98.6 F (37 C)  SpO2: 96%  Weight: 137 lb (62.1 kg)  Height: 6' (1.829 m)    GENERAL: The patient is a well-nourished male, in no acute distress. The vital signs are documented above. CARDIOVASCULAR: There is a regular rate and rhythm. PULMONARY: Non-labored respirations Blister to right fifth toe Left BKA is healing nicely Palpable right femoral pulse  STUDIES:   I reviewed his old arteriogram shows occlusion of all 3 tibial vessels on the right   MEDICAL ISSUES:   I discussed with the patient that his BKA is healing nicely.  We will remove the staples today.  He is seeing [facility] later this afternoon.  Right toe  blister: The patient's previous arteriogram shows severe tibial disease with occlusion of all 3 vessels.  The posterior tibial does appear to come back at the ankle.  I discussed that this is a limb threatening situation and that we need to proceed with angiography to try to improve his blood flow.  This will be through a left femoral approach with intervention on the right leg.  I have the scheduled for Tuesday, November 2.  Leia Alf, MD, FACS [facility] [phone] Pager [phone]

## 2020-05-02 NOTE — H&P (View-Only) (Signed)
Patient name: Dennis Macias MRN: [id number] DOB: August 05, 1950 Sex: male  REASON FOR VISIT:    Postop  HISTORY OF PRESENT ILLNESS:   Dennis Macias is a 69 y.o. male who presented with a left toe ulcer.  He underwent percutaneous intervention on 8 3 2021.  Despite revascularization, his foot deteriorated.  He underwent left below-knee amputation on 923 2021.  He has a new blister on his right fifth toe  CURRENT MEDICATIONS:    Current Outpatient Medications  Medication Sig Dispense Refill  . Accu-Chek FastClix Lancets MISC Check blood sugar up to 7 times a week as instructed 102 each 3  . acetaminophen (TYLENOL) 500 MG tablet Take 500 mg by mouth every 6 (six) hours as needed for moderate pain or headache.    Marland Kitchen aspirin EC 81 MG tablet Take 1 tablet (81 mg total) by mouth daily.    Marland Kitchen atorvastatin (LIPITOR) 80 MG tablet Take 1 tablet (80 mg total) by mouth at bedtime. IM program 90 tablet 3  . Blood Glucose Monitoring Suppl (ACCU-CHEK GUIDE) w/Device KIT 1 each by Does not apply route daily. Check blood sugar as instructed up to 7 times a week 1 kit 0  . dorzolamide-timolol (COSOPT) 22.3-6.8 MG/ML ophthalmic solution Place 1 drop into the right eye 2 (two) times daily. 10 mL 10  . finasteride (PROSCAR) 5 MG tablet Take 1 tablet (5 mg total) by mouth daily. 90 tablet 1  . glucose blood (ACCU-CHEK GUIDE) test strip Check blood sugar up to 7 times a week as instructed 100 each 3  . polyethylene glycol (MIRALAX / GLYCOLAX) 17 g packet Take 17 g by mouth daily as needed for moderate constipation.     . sevelamer carbonate (RENVELA) 800 MG tablet Take 800 mg by mouth See admin instructions. Take 800 mg by mouth three times a day with food on Sun/Mon/Wed/Fri and two times a day on Tues/Thurs/Sat    . vitamin B-12 (CYANOCOBALAMIN) 1000 MCG tablet Take 1 tablet (1,000 mcg total) by mouth daily. 30 tablet 0  . HYDROmorphone (DILAUDID) 2 MG tablet Take 1 tablet (2 mg total)  by mouth every 6 (six) hours as needed for up to 12 doses for severe pain. (Patient not taking: Reported on 05/02/2020) 30 tablet 0   No current facility-administered medications for this visit.    REVIEW OF SYSTEMS:   [X]  denotes positive finding, [ ]  denotes negative finding Cardiac  Comments:  Chest pain or chest pressure:    Shortness of breath upon exertion:    Short of breath when lying flat:    Irregular heart rhythm:    Constitutional    Fever or chills:      PHYSICAL EXAM:   Vitals:   05/02/20 0835  BP: 129/67  Pulse: 71  Resp: 20  Temp: 98.6 F (37 C)  SpO2: 96%  Weight: 137 lb (62.1 kg)  Height: 6' (1.829 m)    GENERAL: The patient is a well-nourished male, in no acute distress. The vital signs are documented above. CARDIOVASCULAR: There is a regular rate and rhythm. PULMONARY: Non-labored respirations Blister to right fifth toe Left BKA is healing nicely Palpable right femoral pulse  STUDIES:   I reviewed his old arteriogram shows occlusion of all 3 tibial vessels on the right   MEDICAL ISSUES:   I discussed with the patient that his BKA is healing nicely.  We will remove the staples today.  He is seeing [facility] later this afternoon.  Right toe  blister: The patient's previous arteriogram shows severe tibial disease with occlusion of all 3 vessels.  The posterior tibial does appear to come back at the ankle.  I discussed that this is a limb threatening situation and that we need to proceed with angiography to try to improve his blood flow.  This will be through a left femoral approach with intervention on the right leg.  I have the scheduled for Tuesday, November 2.  Leia Alf, MD, FACS [facility] [phone] Pager [phone]

## 2020-05-02 NOTE — Progress Notes (Signed)
Spoke with patient's significant other [name] to update on arrival time of 1030 am for procedure on 05/10/20 at [facility]. Voiced understanding. Spoke with nurse Amy at [facility] who will contact patient with alternate date for dialysis treatment for next week.

## 2020-05-03 ENCOUNTER — Telehealth: Payer: Medicare PPO

## 2020-05-03 ENCOUNTER — Telehealth: Payer: Self-pay | Admitting: *Deleted

## 2020-05-03 ENCOUNTER — Encounter: Payer: Self-pay | Admitting: Internal Medicine

## 2020-05-03 ENCOUNTER — Telehealth: Payer: Self-pay | Admitting: Internal Medicine

## 2020-05-03 ENCOUNTER — Other Ambulatory Visit: Payer: Self-pay | Admitting: Internal Medicine

## 2020-05-03 DIAGNOSIS — N3000 Acute cystitis without hematuria: Secondary | ICD-10-CM | POA: Insufficient documentation

## 2020-05-03 LAB — CBC WITH DIFFERENTIAL/PLATELET
Basophils Absolute: 0 10*3/uL (ref 0.0–0.2)
Basos: 0 %
EOS (ABSOLUTE): 0 10*3/uL (ref 0.0–0.4)
Eos: 0 %
Hematocrit: 35.5 % — ABNORMAL LOW (ref 37.5–51.0)
Hemoglobin: 11.1 g/dL — ABNORMAL LOW (ref 13.0–17.7)
Immature Grans (Abs): 0 10*3/uL (ref 0.0–0.1)
Immature Granulocytes: 0 %
Lymphocytes Absolute: 1.3 10*3/uL (ref 0.7–3.1)
Lymphs: 22 %
MCH: 26.9 pg (ref 26.6–33.0)
MCHC: 31.3 g/dL — ABNORMAL LOW (ref 31.5–35.7)
MCV: 86 fL (ref 79–97)
Monocytes Absolute: 0.3 10*3/uL (ref 0.1–0.9)
Monocytes: 5 %
Neutrophils Absolute: 4.4 10*3/uL (ref 1.4–7.0)
Neutrophils: 73 %
Platelets: 221 10*3/uL (ref 150–450)
RBC: 4.12 x10E6/uL — ABNORMAL LOW (ref 4.14–5.80)
RDW: 15.7 % — ABNORMAL HIGH (ref 11.6–15.4)
WBC: 6.1 10*3/uL (ref 3.4–10.8)

## 2020-05-03 LAB — MICROSCOPIC EXAMINATION
Casts: NONE SEEN /lpf
Epithelial Cells (non renal): NONE SEEN /hpf (ref 0–10)
WBC, UA: >30 /hpf — AB (ref 0–5)

## 2020-05-03 LAB — URINALYSIS, ROUTINE W REFLEX MICROSCOPIC
Bilirubin, UA: NEGATIVE
Ketones, UA: NEGATIVE
Nitrite, UA: NEGATIVE
Specific Gravity, UA: 1.013 (ref 1.005–1.030)
Urobilinogen, Ur: 0.2 mg/dL (ref 0.2–1.0)
pH, UA: 6.5 (ref 5.0–7.5)

## 2020-05-03 MED ORDER — CIPROFLOXACIN HCL 500 MG PO TABS: 500.0000 mg | ORAL_TABLET | ORAL | 0 refills | Status: DC

## 2020-05-03 MED FILL — CIPROFLOXACIN HCL 500 MG TA: 500 | 7 days supply | Qty: 4 | Fill #0

## 2020-05-03 NOTE — Progress Notes (Signed)
Acute Office Visit  Subjective:    Patient ID: Dennis Macias, male    DOB: [birth year], 69 y.o.   MRN: [id number]  Chief Complaint  Patient presents with  . Abdominal Pain    c/o abdominial pain    HPI Patient is in today for abdominal pain that began approximately three days ago. Please see problem based charting for details regarding today's visit.   Past Medical History:  Diagnosis Date  . Anemia   . Arthritis    past hx   . Blindness    right eye r/t diabetes per wife [name]  . Cardiorenal syndrome   . Cataract    removed both eyes  . CHF (congestive heart failure) (HCC)    hx  . CKD (chronic kidney disease)    Dialysis T Th Sat  . Dehydration   . Diabetes ([place])    type 2 - diet controlled, no meds  . Glaucoma   . History of CVA (cerebrovascular accident) 09/13/2015  . History of stroke 09/13/2015  . History of urinary retention   . HOH (hard of hearing)    no hearing aids  . Hyperlipidemia   . Hypertension   . NSTEMI (non-ST elevated myocardial infarction) ([place])   . Peripheral vascular disease ([place])   . Pernicious anemia 02/24/2018  . S/P TURP   . Stroke [facility])    2017- March, no deficit   . Syncope 11/2019  . Tachycardia 08/26/2017  . Tubular adenoma of colon 02/2017  . Walker as ambulation aid    and occasional uses cane  . Weight loss, non-intentional 08/26/2017   10 lbs between 6/18 & 2/19    Past Surgical History:  Procedure Laterality Date  . ABDOMINAL AORTOGRAM W/LOWER EXTREMITY N/A 02/09/2020   Procedure: ABDOMINAL AORTOGRAM W/LOWER EXTREMITY;  Surgeon: Serafina Mitchell, MD;  Location: [facility];  Service: Cardiovascular;  Laterality: N/A;  . AMPUTATION Left 03/31/2020   Procedure: LEFT BELOW KNEE AMPUTATION;  Surgeon: Serafina Mitchell, MD;  Location: [facility];  Service: Vascular;  Laterality: Left;  MAC anesthesia with regional block   WILL NEED DIALYSIS THURSDAY  . AV FISTULA PLACEMENT Right 01/19/2020   Procedure: BRACHIOCEPHALIC ARTERIOVENOUS  (AV) FISTULA CREATION;  Surgeon: Waynetta Sandy, MD;  Location: [place];  Service: Vascular;  Laterality: Right;  . CATARACT EXTRACTION, BILATERAL    . COLONOSCOPY    . IR FLUORO GUIDE CV LINE RIGHT  01/14/2020  . IR THORACENTESIS ASP PLEURAL SPACE W/IMG GUIDE  09/21/2019  . IR THORACENTESIS ASP PLEURAL SPACE W/IMG GUIDE  10/16/2019  . IR US GUIDE VASC ACCESS RIGHT  01/15/2020  . LEFT HEART CATH AND CORONARY ANGIOGRAPHY N/A 11/19/2019   Procedure: LEFT HEART CATH AND CORONARY ANGIOGRAPHY;  Surgeon: Jettie Booze, MD;  Location: [place] [facility];  Service: Cardiovascular;  Laterality: N/A;  . PERIPHERAL VASCULAR INTERVENTION Left 02/09/2020   Procedure: PERIPHERAL VASCULAR INTERVENTION;  Surgeon: Serafina Mitchell, MD;  Location: [place] [facility];  Service: Cardiovascular;  Laterality: Left;  . POLYPECTOMY    . REFRACTIVE SURGERY  10/2017  . TEE WITHOUT CARDIOVERSION N/A 09/14/2015   Procedure: TRANSESOPHAGEAL ECHOCARDIOGRAM (TEE);  Surgeon: Larey Dresser, MD;  Location: [place];  Service: Cardiovascular;  Laterality: N/A;  . TRANSURETHRAL RESECTION OF PROSTATE N/A 10/20/2019   Procedure: TRANSURETHRAL RESECTION OF THE PROSTATE (TURP);  Surgeon: Irine Seal, MD;  Location: [facility];  Service: Urology;  Laterality: N/A;  . UPPER GASTROINTESTINAL ENDOSCOPY  Family History  Problem Relation Age of Onset  . Hypertension Mother   . Hyperlipidemia Mother   . Hyperlipidemia Father   . Colon cancer Neg Hx   . Colon polyps Neg Hx   . Esophageal cancer Neg Hx   . Rectal cancer Neg Hx   . Stomach cancer Neg Hx     Social History   Socioeconomic History  . Marital status: Married    Spouse name: Not on file  . Number of children: Not on file  . Years of education: Not on file  . Highest education level: Not on file  Occupational History  . Not on file  Tobacco Use  . Smoking status: Former Smoker    Types: Cigarettes  . Smokeless tobacco: Former Systems developer    Types: Chew     Quit date: 07/09/1978  . Tobacco comment: quit yrs ago  Vaping Use  . Vaping Use: Never used  Substance and Sexual Activity  . Alcohol use: Not Currently    Alcohol/week: 1.0 standard drink    Types: 1 Cans of beer per week    Comment: quit last march/2017  . Drug use: No  . Sexual activity: Not on file  Other Topics Concern  . Not on file  Social History Narrative  . Not on file   Social Determinants of Health   Financial Resource Strain:   . Difficulty of Paying Living Expenses: Not on file  Food Insecurity:   . Worried About Charity fundraiser in the Last Year: Not on file  . Ran Out of Food in the Last Year: Not on file  Transportation Needs:   . Lack of Transportation (Medical): Not on file  . Lack of Transportation (Non-Medical): Not on file  Physical Activity:   . Days of Exercise per Week: Not on file  . Minutes of Exercise per Session: Not on file  Stress:   . Feeling of Stress : Not on file  Social Connections:   . Frequency of Communication with Friends and Family: Not on file  . Frequency of Social Gatherings with Friends and Family: Not on file  . Attends Religious Services: Not on file  . Active Member of Clubs or Organizations: Not on file  . Attends Archivist Meetings: Not on file  . Marital Status: Not on file  Intimate Partner Violence:   . Fear of Current or Ex-Partner: Not on file  . Emotionally Abused: Not on file  . Physically Abused: Not on file  . Sexually Abused: Not on file    Outpatient Medications Prior to Visit  Medication Sig Dispense Refill  . Accu-Chek FastClix Lancets MISC Check blood sugar up to 7 times a week as instructed 102 each 3  . acetaminophen (TYLENOL) 500 MG tablet Take 500 mg by mouth every 6 (six) hours as needed for moderate pain or headache.    Marland Kitchen aspirin EC 81 MG tablet Take 1 tablet (81 mg total) by mouth daily.    Marland Kitchen atorvastatin (LIPITOR) 80 MG tablet Take 1 tablet (80 mg total) by mouth at bedtime. IM  program 90 tablet 3  . Blood Glucose Monitoring Suppl (ACCU-CHEK GUIDE) w/Device KIT 1 each by Does not apply route daily. Check blood sugar as instructed up to 7 times a week 1 kit 0  . dorzolamide-timolol (COSOPT) 22.3-6.8 MG/ML ophthalmic solution Place 1 drop into the right eye 2 (two) times daily. 10 mL 10  . finasteride (PROSCAR) 5 MG tablet Take 1  tablet (5 mg total) by mouth daily. 90 tablet 1  . glucose blood (ACCU-CHEK GUIDE) test strip Check blood sugar up to 7 times a week as instructed 100 each 3  . HYDROmorphone (DILAUDID) 2 MG tablet Take 1 tablet (2 mg total) by mouth every 6 (six) hours as needed for up to 12 doses for severe pain. (Patient not taking: Reported on 05/02/2020) 30 tablet 0  . polyethylene glycol (MIRALAX / GLYCOLAX) 17 g packet Take 17 g by mouth daily as needed for moderate constipation.     . sevelamer carbonate (RENVELA) 800 MG tablet Take 800 mg by mouth See admin instructions. Take 800 mg by mouth three times a day with food on Sun/Mon/Wed/Fri and two times a day on Tues/Thurs/Sat    . vitamin B-12 (CYANOCOBALAMIN) 1000 MCG tablet Take 1 tablet (1,000 mcg total) by mouth daily. 30 tablet 0   No facility-administered medications prior to visit.    Allergies  Allergen Reactions  . Cefepime Other (See Comments)    Pt had BAD encephalopathy from Cefepime    Review of Systems  Constitutional: Negative for appetite change, chills and fever.  Respiratory: Negative for cough and shortness of breath.   Cardiovascular: Negative for chest pain.  Gastrointestinal: Negative for abdominal distention, blood in stool, constipation, diarrhea, nausea and vomiting.  Genitourinary: Positive for difficulty urinating and enuresis. Negative for flank pain.  Neurological: Negative for tremors.       Objective:    Physical Exam Constitutional:      General: He is not in acute distress.    Appearance: He is well-developed.  Eyes:     General: No scleral  icterus. Cardiovascular:     Rate and Rhythm: Normal rate and regular rhythm.  Pulmonary:     Effort: Pulmonary effort is normal.     Breath sounds: Normal breath sounds.  Abdominal:     General: Abdomen is flat. Bowel sounds are normal.     Palpations: Abdomen is soft.     Tenderness: There is no abdominal tenderness.  Skin:    General: Skin is warm and dry.  Neurological:     Mental Status: He is alert.     Comments: At baseline      BP 122/70 (BP Location: Left Arm, Patient Position: Sitting, Cuff Size: Normal)   Pulse 79   Temp 98.7 F (37.1 C) (Oral)   SpO2 99%  Wt Readings from Last 3 Encounters:  05/02/20 137 lb (62.1 kg)  04/05/20 138 lb 7.2 oz (62.8 kg)  03/21/20 157 lb (71.2 kg)    Health Maintenance Due  Topic Date Due  . COVID-19 Vaccine (1) Never done  . INFLUENZA VACCINE  02/07/2020  . URINE MICROALBUMIN  03/25/2020    There are no preventive care reminders to display for this patient.   Lab Results  Component Value Date   TSH 4.534 (H) 11/13/2019   Lab Results  Component Value Date   WBC 6.1 05/02/2020   HGB 11.1 (L) 05/02/2020   HCT 35.5 (L) 05/02/2020   MCV 86 05/02/2020   PLT 221 05/02/2020   Lab Results  Component Value Date   NA 136 04/05/2020   K 3.4 (L) 04/05/2020   CO2 27 04/05/2020   GLUCOSE 173 (H) 04/05/2020   BUN 25 (H) 04/05/2020   CREATININE 3.56 (H) 04/05/2020   BILITOT 0.5 04/03/2020   ALKPHOS 56 04/03/2020   AST 15 04/03/2020   ALT 8 04/03/2020   PROT 5.6 (L)  04/03/2020   ALBUMIN 1.6 (L) 04/05/2020   CALCIUM 7.8 (L) 04/05/2020   ANIONGAP 9 04/05/2020   GFR 86.27 11/05/2017   Lab Results  Component Value Date   CHOL 128 02/18/2020   Lab Results  Component Value Date   HDL 62 02/18/2020   Lab Results  Component Value Date   LDLCALC 61 02/18/2020   Lab Results  Component Value Date   TRIG 27 02/18/2020   Lab Results  Component Value Date   CHOLHDL 2.1 02/18/2020   Lab Results  Component Value Date    HGBA1C 6.4 (H) 02/18/2020       Assessment & Plan:   Problem List Items Addressed This Visit      Genitourinary   Acute cystitis - Primary    Patient presents with 3 day history of intermittent abdominal pain. Somewhat difficult to obtain history, but he states he has noticed lower abdominal pain when he wakes up in the morning and spontaneously resolves. Described as a deep ache. Not associated with eating. He denies any changes in bowel movements. His significant other notes that he has been incontinent of urine the last 2 nights and has not been able to void like normal during the day.  Patient attempted to void in clinic for urine sample, but was unable to. Bladder scan showed 330 cc of urine. In and out cath was performed to obtain urine sample. U/A consistent with infection based on bacteruria and clinical presentation. Urine culture pending.  Will start empiric treatment with 7 day course of Cipro to be given on non-dialysis days.       Relevant Orders   CBC with Diff (Completed)   Urinalysis, Reflex Microscopic (Completed)   Microscopic Examination (Completed)   Culture, Urine       Meds ordered this encounter  Medications  . ciprofloxacin (CIPRO) 500 MG tablet    Sig: Take 1 tablet (500 mg total) by mouth every other day for 7 days. Take on NON-dialysis days.    Dispense:  4 tablet    Refill:  0     Josslyn Ciolek D Davian Wollenberg, DO

## 2020-05-03 NOTE — Telephone Encounter (Signed)
  Chronic Care Management   Outreach Note  05/03/2020 Name: Dennis Macias MRN: [id number] DOB: [birth year]  Referred by: [name], MD Reason for referral : Chronic Care Management (NIDDM, CAD, HF, HTN, HLD, ESRD, s/p left BKA on 03/31/20)   An unsuccessful telephone outreach was attempted today. The patient was referred to the case management team for assistance with care management and care coordination.   Follow Up Plan: A HIPAA compliant phone message was left for the patient providing contact information and requesting a return call.  The care management team will reach out to the patient again over the next 7-10 days.   Kelli Churn RN, CCM, [facility] RN Care Manager [phone]

## 2020-05-03 NOTE — Telephone Encounter (Signed)
Called and spoke with patient's significant other, [name], to update her that patient's urine did appear infected. Instructions provided to take antibiotic on non-dialysis days for total of 7 days (4 tabs).

## 2020-05-03 NOTE — Assessment & Plan Note (Signed)
Patient presents with 3 day history of intermittent abdominal pain. Somewhat difficult to obtain history, but he states he has noticed lower abdominal pain when he wakes up in the morning and spontaneously resolves. Described as a deep ache. Not associated with eating. He denies any changes in bowel movements. His significant other notes that he has been incontinent of urine the last 2 nights and has not been able to void like normal during the day.  Patient attempted to void in clinic for urine sample, but was unable to. Bladder scan showed 330 cc of urine. In and out cath was performed to obtain urine sample. U/A consistent with infection based on bacteruria and clinical presentation. Urine culture pending.  Will start empiric treatment with 7 day course of Cipro to be given on non-dialysis days.

## 2020-05-04 ENCOUNTER — Telehealth: Payer: Self-pay

## 2020-05-04 NOTE — Telephone Encounter (Signed)
Agreed.  Thank you

## 2020-05-04 NOTE — Telephone Encounter (Signed)
Return call to Durwin Reges - no answer; left message to call the office.

## 2020-05-04 NOTE — Telephone Encounter (Signed)
Pls contact Timmothy Sours [phone]; delay in OT; spouse decline pt had MD visit to fatigue-

## 2020-05-04 NOTE — Telephone Encounter (Signed)
Received TC from [place] at [facility].  States he tried to see patient for therapy on 10/25, patient declined d/t MD appt at [facility]. 2nd attempt for OT made 10/26, patient declined d/t dialysis 3rd attempt for OT made today, 10/27, patient declinced d/t 3 other disciplines seeing patient today.  OT would like to start seeing patient on Friday or Monday next week. Verbal Order given  Will route to [facility] team. [place], RN,BSN

## 2020-05-04 NOTE — Telephone Encounter (Signed)
RTC to [facility] at [place], [place] obtained and message left triage nurse returning call and to call back. S[place], RN,BSN

## 2020-05-06 ENCOUNTER — Other Ambulatory Visit (HOSPITAL_COMMUNITY)
Admission: RE | Admit: 2020-05-06 | Discharge: 2020-05-06 | Disposition: A | Discharge status: Home / Self Care | Payer: Medicare PPO | Source: Ambulatory Visit | Attending: Surgery | Admitting: Surgery

## 2020-05-06 ENCOUNTER — Ambulatory Visit: Payer: Medicare PPO | Admitting: *Deleted

## 2020-05-06 DIAGNOSIS — Z01812 Encounter for preprocedural laboratory examination: Secondary | ICD-10-CM | POA: Diagnosis present

## 2020-05-06 DIAGNOSIS — E11319 Type 2 diabetes mellitus with unspecified diabetic retinopathy without macular edema: Secondary | ICD-10-CM

## 2020-05-06 DIAGNOSIS — E785 Hyperlipidemia, unspecified: Secondary | ICD-10-CM

## 2020-05-06 DIAGNOSIS — I2510 Atherosclerotic heart disease of native coronary artery without angina pectoris: Secondary | ICD-10-CM

## 2020-05-06 DIAGNOSIS — N186 End stage renal disease: Secondary | ICD-10-CM

## 2020-05-06 DIAGNOSIS — Z20822 Contact with and (suspected) exposure to covid-19: Secondary | ICD-10-CM | POA: Insufficient documentation

## 2020-05-06 NOTE — Progress Notes (Signed)
Internal Medicine Clinic Resident  I have personally reviewed this encounter including the documentation in this note and/or discussed this patient with the care management provider. I will address any urgent items identified by the care management provider and will communicate my actions to the patient's PCP. I have reviewed the patient's CCM visit with my supervising attending, Dr Vincent.  Gabreal Worton, MD 05/06/2020    

## 2020-05-06 NOTE — Patient Instructions (Signed)
Visit Information [name], it was nice speaking with you today about [place].  Goals Addressed              This Visit's Progress     Patient Stated   .  COMPLETED: "I want to apply for Medicaid so that I can get help at home if approved" (pt-stated)        Current Barriers:  Marland Kitchen Knowledge Barriers related to resources available for in home assistance. met with patient and significant other and patient's caregiver Dixon Boos. She showed this CCM RN the letter she received from [facility] indicating that patient qualified for [facility] medical assistance only. She says she doesn't understand what the letter means.  Case Manager Clinical Goal(s):  Marland Kitchen Over the next 90 days, patient will work with BSW to address needs related to applying for Medicaid . Over the next 90 days, BSW will collaborate with RN Care Manager to address care management and care coordination needs  Interventions:  . Assessed status of Medicaid application Patient Self Care Activities:  . Patient verbalizes understanding of plan to work with Care Guide to apply for Medicaid . Explained that per the letter the patient received from [facility], he will receive assistance with payment of his Part B Medicare premium with retroactive payment from April 2021 . Referred [name] to the Medicaid case worker's name and phone number for further questions . Attends all scheduled provider appointments  Please see past updates related to this goal by clicking on the "Past Updates" button in the selected goal      .  Significant other stated "Demetris is doing OK from having his left leg amputated but he's not able to walk on his right leg because he has a sore on his foot." (pt-stated)        CARE PLAN ENTRY (see longitudinal plan of care for additional care plan information)  Current Barriers:  . Chronic Disease Management support and education needs related to ESRD, DM, HTN, HLD, HF and CAD- spoke with patient's significant other and caregiver Dixon Boos via phone, she says patient continues to receive home health services of physical therapy from [facility], she says patient is not able to stand on his right leg because he has a sore on the right foot so the focus of HHPT is strengthening, [name] says patient will go for arteriogram on 11/1 to evaluate blood flow to right foot, she voiced much appreciation for this CCM RN's calls to check on patient  Nurse Case Manager Clinical Goal(s):  Over the next 30 -60 days, patient's significant other  will:   Work with the care management team to address educational, disease management, and care coordination needs   Begin or continue self health monitoring activities as directed -  take medications as directed  Call provider office for new or worsened signs and symptoms related to CHF, CAD, HTN, HLD, DMII and ESRD  Call care management team with questions or concerns  Verbalize basic understanding of patient centered plan of care established today  Interventions:  . Inter-disciplinary care team collaboration (see longitudinal plan of care) . Via significant other and caregiver Dixon Boos, assessed patient's current clinical status in regards to HTN, DM, and s/p left BKA on 03/31/20 . Reviewed medications with patient and assessed medication taking behavior;  if indicated, discussed importance of medication adherence  . Verified that patient is receiving home health services and assessed progress with HHPT  . Reviewed scheduled/upcoming provider appointments including:  appointment for arteriogram on 05/09/20 and follow up with Dr Trula Slade on 06/13/20 .  Discussed plans with patient for ongoing care management follow up and provided patient with direct contact information for care management team  Patient Self Care Activities:  . Patient takes medications as prescribed with caregiver's assistance . Attends all scheduled provider appointments . Caregiver calls pharmacy for medication  refills . Caregiver calls provider office for new concerns or questions . Unable to self administer medications as prescribed . Unable to perform ADLs independently . Unable to perform IADLs independently  Please see past updates related to this goal by clicking on the "Past Updates" button in the selected goal         The patient verbalized understanding of instructions provided today and declined a print copy of patient instruction materials.   The care management team will reach out to the patient again over the next 30-60 days.   Kelli Churn RN, CCM, [facility] RN Care Manager [phone]

## 2020-05-06 NOTE — Chronic Care Management (AMB) (Signed)
Chronic Care Management   Follow Up Note   05/06/2020 Name: Dennis Macias MRN: [id number] DOB: 1950/11/13  Referred by: Dennis Hansen, MD Reason for referral : Chronic Care Management (NIDDM, CAD, HF, HTN, HLD, ESRD, s/p left BKA on 03/31/20)   Divit Stipp is a 69 y.o. year old male who is a primary care patient of Dennis Macias, Dennis Dell, MD. The CCM team was consulted for assistance with chronic disease management and care coordination needs.    Review of patient status, including review of consultants reports, relevant laboratory and other test results, and collaboration with appropriate care team members and the patient's provider was performed as part of comprehensive patient evaluation and provision of chronic care management services.    SDOH (Social Determinants of Health) assessments performed: No See Care Plan activities for detailed interventions related to [facility])     Outpatient Encounter Medications as of 05/06/2020  Medication Sig Note  . Accu-Chek FastClix Lancets MISC Check blood sugar up to 7 times a week as instructed   . acetaminophen (TYLENOL) 500 MG tablet Take 500 mg by mouth every 6 (six) hours as needed for moderate pain or headache.   Marland Kitchen aspirin EC 81 MG tablet Take 1 tablet (81 mg total) by mouth daily.   Marland Kitchen atorvastatin (LIPITOR) 80 MG tablet Take 1 tablet (80 mg total) by mouth at bedtime. IM program   . Blood Glucose Monitoring Suppl (ACCU-CHEK GUIDE) w/Device KIT 1 each by Does not apply route daily. Check blood sugar as instructed up to 7 times a week   . ciprofloxacin (CIPRO) 500 MG tablet Take 1 tablet (500 mg total) by mouth every other day for 7 days. Take on NON-dialysis days. 05/03/2020: Will start on 05/04/2020  . dorzolamide-timolol (COSOPT) 22.3-6.8 MG/ML ophthalmic solution Place 1 drop into the right eye 2 (two) times daily.   . finasteride (PROSCAR) 5 MG tablet Take 1 tablet (5 mg total) by mouth daily.   Marland Kitchen glucose blood (ACCU-CHEK GUIDE) test strip Check  blood sugar up to 7 times a week as instructed   . HYDROmorphone (DILAUDID) 2 MG tablet Take 1 tablet (2 mg total) by mouth every 6 (six) hours as needed for up to 12 doses for severe pain. (Patient not taking: Reported on 05/02/2020)   . lidocaine-prilocaine (EMLA) cream Apply 1 application topically daily as needed (prior to fistula use).   Marland Kitchen oxyCODONE-acetaminophen (PERCOCET/ROXICET) 5-325 MG tablet Take 1 tablet by mouth every 8 (eight) hours as needed for moderate pain.    . polyethylene glycol (MIRALAX / GLYCOLAX) 17 g packet Take 17 g by mouth daily as needed for moderate constipation.    . sevelamer carbonate (RENVELA) 800 MG tablet Take 800 mg by mouth See admin instructions. Take 800 mg by mouth three times a day with food on Sun/Mon/Wed/Fri and two times a day on Tues/Thurs/Sat   . vitamin B-12 (CYANOCOBALAMIN) 1000 MCG tablet Take 1 tablet (1,000 mcg total) by mouth daily.    No facility-administered encounter medications on file as of 05/06/2020.     Objective:  BP Readings from Last 3 Encounters:  05/02/20 122/70  05/02/20 129/67  04/07/20 (!) 161/84   Wt Readings from Last 3 Encounters:  05/02/20 137 lb (62.1 kg)  04/05/20 138 lb 7.2 oz (62.8 kg)  03/21/20 157 lb (71.2 kg)   Lab Results  Component Value Date   HGBA1C 6.4 (H) 02/18/2020   HGBA1C 5.9 (H) 11/14/2019   HGBA1C 5.2 10/13/2019   Lab Results  Component  Value Date   LDLCALC 61 02/18/2020   CREATININE 3.56 (H) 04/05/2020    Goals Addressed              This Visit's Progress     Patient Stated   .  COMPLETED: "I want to apply for Medicaid so that I can get help at home if approved" (pt-stated)        Current Barriers:  Marland Kitchen Knowledge Barriers related to resources available for in home assistance. met with patient and significant other and patient's caregiver Dennis Macias. She showed this CCM RN the letter she received from [facility] indicating that patient qualified for [facility] medical assistance only. She  says she doesn't understand what the letter means.  Case Manager Clinical Goal(s):  Marland Kitchen Over the next 90 days, patient will work with BSW to address needs related to applying for Medicaid . Over the next 90 days, BSW will collaborate with RN Care Manager to address care management and care coordination needs  Interventions:  . Assessed status of Medicaid application Patient Self Care Activities:  . Patient verbalizes understanding of plan to work with Care Guide to apply for Medicaid . Explained that per the letter the patient received from [facility], he will receive assistance with payment of his Part B Medicare premium with retroactive payment from April 2021 . Referred Dennis Macias to the Medicaid case worker's name and phone number for further questions . Attends all scheduled provider appointments  Please see past updates related to this goal by clicking on the "Past Updates" button in the selected goal      .  Significant other stated "Dennis Macias is doing OK from having his left leg amputated but he's not able to walk on his right leg because he has a sore on his foot." (pt-stated)        CARE PLAN ENTRY (see longitudinal plan of care for additional care plan information)  Current Barriers:  . Chronic Disease Management support and education needs related to ESRD, DM, HTN, HLD, HF and CAD- spoke with patient's significant other and caregiver Dennis Macias via phone, she says patient continues to receive home health services of physical therapy from [facility], she says patient is not able to stand on his right leg because he has a sore on the right foot so the focus of HHPT is strengthening, Dennis Macias says patient will go for arteriogram on 11/1 to evaluate blood flow to right foot, she voiced much appreciation for this CCM RN's calls to check on patient  Nurse Case Manager Clinical Goal(s):  Over the next 30 -60 days, patient's significant other  will:   Work with the care management team to address  educational, disease management, and care coordination needs   Begin or continue self health monitoring activities as directed -  take medications as directed  Call provider office for new or worsened signs and symptoms related to CHF, CAD, HTN, HLD, DMII and ESRD  Call care management team with questions or concerns  Verbalize basic understanding of patient centered plan of care established today  Interventions:   Inter-disciplinary care team collaboration (see longitudinal plan of care)  Via significant other and caregiver Dennis Macias, assessed patient's current clinical status in regards to HTN, DM, and s/p left BKA on 03/31/20  Reviewed medications with patient and assessed medication taking behavior;  if indicated, discussed importance of medication adherence   Verified that patient is receiving home health services and assessed progress with HHPT   Reviewed  scheduled/upcoming provider appointments including:  appointment for arteriogram on 05/09/20 and follow up with Dr Trula Slade on 06/13/20   Discussed plans with patient for ongoing care management follow up and provided patient with direct contact information for care management team  Patient Self Care Activities:  . Patient takes medications as prescribed with caregiver's assistance . Attends all scheduled provider appointments . Caregiver calls pharmacy for medication refills . Caregiver calls provider office for new concerns or questions . Unable to self administer medications as prescribed . Unable to perform ADLs independently . Unable to perform IADLs independently  Please see past updates related to this goal by clicking on the "Past Updates" button in the selected goal          Plan:   The care management team will reach out to the patient again over the next 30-60 days.    SIGNATURE

## 2020-05-07 LAB — SARS CORONAVIRUS 2 (TAT 6-24 HRS): SARS Coronavirus 2: NEGATIVE

## 2020-05-09 ENCOUNTER — Ambulatory Visit: Payer: Medicare PPO | Admitting: Podiatry

## 2020-05-09 DIAGNOSIS — Z992 Dependence on renal dialysis: Secondary | ICD-10-CM | POA: Diagnosis not present

## 2020-05-09 DIAGNOSIS — N2581 Secondary hyperparathyroidism of renal origin: Secondary | ICD-10-CM | POA: Diagnosis not present

## 2020-05-09 DIAGNOSIS — N186 End stage renal disease: Secondary | ICD-10-CM | POA: Diagnosis not present

## 2020-05-09 LAB — SPECIMEN STATUS REPORT

## 2020-05-09 LAB — URINE CULTURE

## 2020-05-09 MED FILL — ATORVASTATIN 80 MG TABLET: 80 | 30 days supply | Qty: 30 | Fill #0

## 2020-05-09 MED FILL — FINASTERIDE 5 MG TABLET: 5 | 30 days supply | Qty: 30 | Fill #0

## 2020-05-09 NOTE — Addendum Note (Signed)
Addended by: Truddie Crumble on: 05/09/2020 09:10 AM   Modules accepted: Orders

## 2020-05-09 NOTE — Progress Notes (Signed)
Internal Medicine Clinic Attending  CCM services provided by the care management provider and their documentation were discussed with Dr. Marianna Payment. We reviewed the pertinent findings, urgent action items addressed by the resident and non-urgent items to be addressed by the PCP.  I agree with the assessment, diagnosis, and plan of care documented in the CCM and resident's note.  Axel Filler, MD 05/09/2020

## 2020-05-09 NOTE — Progress Notes (Signed)
Internal Medicine Clinic Attending  Case discussed with Dr. Bloomfield  At the time of the visit.  We reviewed the resident's history and exam and pertinent patient test results.  I agree with the assessment, diagnosis, and plan of care documented in the resident's note.  

## 2020-05-09 NOTE — Progress Notes (Signed)
Internal Medicine Clinic Resident  I have personally reviewed this encounter including the documentation in this note and/or discussed this patient with the care management provider. I will address any urgent items identified by the care management provider and will communicate my actions to the patient's PCP. I have reviewed the patient's CCM visit with my supervising attending, Dr Vincent.  Jairon Ripberger, MD 05/09/2020    

## 2020-05-10 ENCOUNTER — Encounter (HOSPITAL_COMMUNITY)
Admission: RE | Disposition: A | Discharge status: Home / Self Care | Payer: Self-pay | Source: Home / Self Care | Attending: Surgery

## 2020-05-10 ENCOUNTER — Other Ambulatory Visit (HOSPITAL_COMMUNITY): Payer: Self-pay | Admitting: Surgery

## 2020-05-10 ENCOUNTER — Ambulatory Visit (HOSPITAL_COMMUNITY)
Admission: RE | Admit: 2020-05-10 | Discharge: 2020-05-10 | Disposition: A | Discharge status: Home / Self Care | Payer: Medicare PPO | Attending: Surgery | Admitting: Surgery

## 2020-05-10 DIAGNOSIS — I70235 Atherosclerosis of native arteries of right leg with ulceration of other part of foot: Secondary | ICD-10-CM | POA: Diagnosis not present

## 2020-05-10 DIAGNOSIS — L97519 Non-pressure chronic ulcer of other part of right foot with unspecified severity: Secondary | ICD-10-CM | POA: Insufficient documentation

## 2020-05-10 HISTORY — PX: PERIPHERAL VASCULAR BALLOON ANGIOPLASTY: CATH118281

## 2020-05-10 HISTORY — PX: ABDOMINAL AORTOGRAM W/LOWER EXTREMITY: CATH118223

## 2020-05-10 LAB — POCT I-STAT, CHEM 8
BUN: 28 mg/dL — ABNORMAL HIGH (ref 8–23)
Calcium, Ion: 1.2 mmol/L (ref 1.15–1.40)
Chloride: 93 mmol/L — ABNORMAL LOW (ref 98–111)
Creatinine, Ser: 3.3 mg/dL — ABNORMAL HIGH (ref 0.61–1.24)
Glucose, Bld: 251 mg/dL — ABNORMAL HIGH (ref 70–99)
HCT: 32 % — ABNORMAL LOW (ref 39.0–52.0)
Hemoglobin: 10.9 g/dL — ABNORMAL LOW (ref 13.0–17.0)
Potassium: 3.6 mmol/L (ref 3.5–5.1)
Sodium: 138 mmol/L (ref 135–145)
TCO2: 33 mmol/L — ABNORMAL HIGH (ref 22–32)

## 2020-05-10 LAB — POCT ACTIVATED CLOTTING TIME
Activated Clotting Time: 208 seconds
Activated Clotting Time: 213 seconds

## 2020-05-10 SURGERY — ABDOMINAL AORTOGRAM W/LOWER EXTREMITY
Anesthesia: LOCAL | Laterality: Right

## 2020-05-10 MED ORDER — HEPARIN SODIUM (PORCINE) 1000 UNIT/ML IJ SOLN
INTRAMUSCULAR | Status: AC
  Filled 2020-05-10: qty 1

## 2020-05-10 MED ORDER — MIDAZOLAM HCL 2 MG/2ML IJ SOLN
INTRAMUSCULAR | Status: AC
  Filled 2020-05-10: qty 2

## 2020-05-10 MED ORDER — SODIUM CHLORIDE 0.9% FLUSH: 3.0000 mL | Freq: Two times a day (BID) | INTRAVENOUS | Status: DC

## 2020-05-10 MED ORDER — HYDRALAZINE HCL 20 MG/ML IJ SOLN: 5.0000 mg | INTRAMUSCULAR | Status: DC | PRN

## 2020-05-10 MED ORDER — LABETALOL HCL 5 MG/ML IV SOLN: 10.0000 mg | INTRAVENOUS | Status: DC | PRN

## 2020-05-10 MED ORDER — FENTANYL CITRATE (PF) 100 MCG/2ML IJ SOLN
INTRAMUSCULAR | Status: DC | PRN
  Administered 2020-05-10: 50 ug via INTRAVENOUS
  Administered 2020-05-10: 25 ug via INTRAVENOUS

## 2020-05-10 MED ORDER — HEPARIN (PORCINE) IN NACL 1000-0.9 UT/500ML-% IV SOLN
INTRAVENOUS | Status: AC
  Filled 2020-05-10: qty 500

## 2020-05-10 MED ORDER — SODIUM CHLORIDE 0.9 % IV SOLN: 250.0000 mL | INTRAVENOUS | Status: DC | PRN

## 2020-05-10 MED ORDER — MIDAZOLAM HCL 2 MG/2ML IJ SOLN
INTRAMUSCULAR | Status: DC | PRN
  Administered 2020-05-10: 1 mg via INTRAVENOUS
  Administered 2020-05-10: 2 mg via INTRAVENOUS

## 2020-05-10 MED ORDER — LIDOCAINE HCL (PF) 1 % IJ SOLN
INTRAMUSCULAR | Status: AC
  Filled 2020-05-10: qty 30

## 2020-05-10 MED ORDER — ACETAMINOPHEN 325 MG PO TABS: 650.0000 mg | ORAL_TABLET | ORAL | Status: DC | PRN

## 2020-05-10 MED ORDER — MORPHINE SULFATE (PF) 2 MG/ML IV SOLN: 2.0000 mg | INTRAVENOUS | Status: DC | PRN

## 2020-05-10 MED ORDER — HEPARIN SODIUM (PORCINE) 1000 UNIT/ML IJ SOLN
INTRAMUSCULAR | Status: DC | PRN
  Administered 2020-05-10: 7000 [IU] via INTRAVENOUS
  Administered 2020-05-10 (×2): 2000 [IU] via INTRAVENOUS

## 2020-05-10 MED ORDER — ONDANSETRON HCL 4 MG/2ML IJ SOLN: 4.0000 mg | Freq: Four times a day (QID) | INTRAMUSCULAR | Status: DC | PRN

## 2020-05-10 MED ORDER — HEPARIN (PORCINE) IN NACL 1000-0.9 UT/500ML-% IV SOLN
INTRAVENOUS | Status: DC | PRN
  Administered 2020-05-10 (×2): 500 mL

## 2020-05-10 MED ORDER — SODIUM CHLORIDE 0.9% FLUSH: 3.0000 mL | INTRAVENOUS | Status: DC | PRN

## 2020-05-10 MED ORDER — IODIXANOL 320 MG/ML IV SOLN
INTRAVENOUS | Status: DC | PRN
  Administered 2020-05-10: 120 mL via INTRA_ARTERIAL

## 2020-05-10 MED ORDER — CLOPIDOGREL BISULFATE 75 MG PO TABS: 300.0000 mg | ORAL_TABLET | Freq: Once | ORAL | Status: DC

## 2020-05-10 MED ORDER — OXYCODONE HCL 5 MG PO TABS: 5.0000 mg | ORAL_TABLET | ORAL | Status: DC | PRN

## 2020-05-10 MED ORDER — ASPIRIN EC 81 MG PO TBEC: 81.0000 mg | DELAYED_RELEASE_TABLET | Freq: Every day | ORAL | Status: DC

## 2020-05-10 MED ORDER — CLOPIDOGREL BISULFATE 75 MG PO TABS: 75.0000 mg | ORAL_TABLET | Freq: Every day | ORAL | Status: DC

## 2020-05-10 MED ORDER — CLOPIDOGREL BISULFATE 300 MG PO TABS
ORAL_TABLET | ORAL | Status: DC | PRN
  Administered 2020-05-10: 300 mg via ORAL

## 2020-05-10 MED ORDER — LIDOCAINE HCL (PF) 1 % IJ SOLN
INTRAMUSCULAR | Status: DC | PRN
  Administered 2020-05-10: 15 mL via INTRADERMAL

## 2020-05-10 MED ORDER — FENTANYL CITRATE (PF) 100 MCG/2ML IJ SOLN
INTRAMUSCULAR | Status: AC
  Filled 2020-05-10: qty 2

## 2020-05-10 MED ORDER — CLOPIDOGREL BISULFATE 75 MG PO TABS: 75.0000 mg | ORAL_TABLET | Freq: Every day | ORAL | 11 refills | Status: DC

## 2020-05-10 MED ORDER — CLOPIDOGREL BISULFATE 300 MG PO TABS
ORAL_TABLET | ORAL | Status: AC
  Filled 2020-05-10: qty 1

## 2020-05-10 MED FILL — CLOPIDOGREL 75 MG TABLET: 75 | 30 days supply | Qty: 30 | Fill #0

## 2020-05-10 SURGICAL SUPPLY — 31 items
BAG SNAP BAND KOVER 36X36 (MISCELLANEOUS) ×3 IMPLANT
BALLN COYOTE OTW 1.5X40X150 (BALLOONS) ×3
BALLN COYOTE OTW 2X150X150 (BALLOONS) ×3
BALLN JADE .014 1.5 X 40 (BALLOONS) ×3
BALLN JADE .014 2.5 X 150 (BALLOONS) ×6
BALLN JADE .018 2.5 X 150 (BALLOONS) ×3
BALLN SAPPHIRE 1.0X15 (BALLOONS) ×3
BALLN STERLING OTW 2X150X150 (BALLOONS) ×6
BALLOON COYOTE OTW 1.5X40X150 (BALLOONS) ×2 IMPLANT
BALLOON COYOTE OTW 2X150X150 (BALLOONS) ×2 IMPLANT
BALLOON JADE .014 1.5 X 40 (BALLOONS) ×2 IMPLANT
BALLOON JADE .014 2.5 X 150 (BALLOONS) ×4 IMPLANT
BALLOON JADE .018 2.5 X 150 (BALLOONS) ×2 IMPLANT
BALLOON SAPPHIRE 1.0X15 (BALLOONS) ×2 IMPLANT
BALLOON STERLING OTW 2X150X150 (BALLOONS) ×4 IMPLANT
CATH OMNI FLUSH 5F 65CM (CATHETERS) ×3 IMPLANT
CLOSURE PERCLOSE PROSTYLE (VASCULAR PRODUCTS) ×3 IMPLANT
COVER DOME SNAP 22 D (MISCELLANEOUS) ×6 IMPLANT
KIT ENCORE 26 ADVANTAGE (KITS) ×3 IMPLANT
KIT MICROPUNCTURE NIT STIFF (SHEATH) ×3 IMPLANT
KIT PV (KITS) ×3 IMPLANT
SHEATH PINNACLE 5F 10CM (SHEATH) ×3 IMPLANT
SHEATH PINNACLE ST 6F 65CM (SHEATH) ×3 IMPLANT
SHEATH PROBE COVER 6X72 (BAG) ×3 IMPLANT
SHIELD RADPAD SCOOP 12X17 (MISCELLANEOUS) ×3 IMPLANT
SYR MEDRAD MARK V 150ML (SYRINGE) ×3 IMPLANT
TRANSDUCER W/STOPCOCK (MISCELLANEOUS) ×3 IMPLANT
TRAY PV CATH (CUSTOM PROCEDURE TRAY) ×3 IMPLANT
WIRE BENTSON .035X145CM (WIRE) ×3 IMPLANT
WIRE G V18X300CM (WIRE) ×3 IMPLANT
WIRE ZILIENT 014 4G (WIRE) ×6 IMPLANT

## 2020-05-10 NOTE — Interval H&P Note (Signed)
History and Physical Interval Note:  05/10/2020 11:09 AM  Dennis Macias  has presented today for surgery, with the diagnosis of ulceration.  The various methods of treatment have been discussed with the patient and family. After consideration of risks, benefits and other options for treatment, the patient has consented to  Procedure(s): ABDOMINAL AORTOGRAM W/LOWER EXTREMITY (N/A) as a surgical intervention.  The patient's history has been reviewed, patient examined, no change in status, stable for surgery.  I have reviewed the patient's chart and labs.  Questions were answered to the patient's satisfaction.     [name]

## 2020-05-10 NOTE — Discharge Instructions (Signed)
Femoral Site Care This sheet gives you information about how to care for yourself after your procedure. Your health care provider may also give you more specific instructions. If you have problems or questions, contact your health care provider. What can I expect after the procedure? After the procedure, it is common to have:  Bruising that usually fades within 1-2 weeks.  Tenderness at the site. Follow these instructions at home: Wound care  Follow instructions from your health care provider about how to take care of your insertion site. Make sure you: ? Wash your hands with soap and water before you change your bandage (dressing). If soap and water are not available, use hand sanitizer. ? Change your dressing as told by your health care provider. ? Leave stitches (sutures), skin glue, or adhesive strips in place. These skin closures may need to stay in place for 2 weeks or longer. If adhesive strip edges start to loosen and curl up, you may trim the loose edges. Do not remove adhesive strips completely unless your health care provider tells you to do that.  Do not take baths, swim, or use a hot tub until your health care provider approves.  You may shower 24-48 hours after the procedure or as told by your health care provider. ? Gently wash the site with plain soap and water. ? Pat the area dry with a clean towel. ? Do not rub the site. This may cause bleeding.  Do not apply powder or lotion to the site. Keep the site clean and dry.  Check your femoral site every day for signs of infection. Check for: ? Redness, swelling, or pain. ? Fluid or blood. ? Warmth. ? Pus or a bad smell. Activity  For the first 2-3 days after your procedure, or as long as directed: ? Avoid climbing stairs as much as possible. ? Do not squat.  Do not lift anything that is heavier than 10 lb (4.5 kg), or the limit that you are told, until your health care provider says that it is safe.  Rest as  directed. ? Avoid sitting for a long time without moving. Get up to take short walks every 1-2 hours.  Do not drive for 24 hours if you were given a medicine to help you relax (sedative). General instructions  Take over-the-counter and prescription medicines only as told by your health care provider.  Keep all follow-up visits as told by your health care provider. This is important. Contact a health care provider if you have:  A fever or chills.  You have redness, swelling, or pain around your insertion site. Get help right away if:  The catheter insertion area swells very fast.  You pass out.  You suddenly start to sweat or your skin gets clammy.  The catheter insertion area is bleeding, and the bleeding does not stop when you hold steady pressure on the area.  The area near or just beyond the catheter insertion site becomes pale, cool, tingly, or numb. These symptoms may represent a serious problem that is an emergency. Do not wait to see if the symptoms will go away. Get medical help right away. Call your local emergency services (911 in the [place] Do not drive yourself to the hospital. Summary  After the procedure, it is common to have bruising that usually fades within 1-2 weeks.  Check your femoral site every day for signs of infection.  Do not lift anything that is heavier than 10 lb (4.5 kg), or the   limit that you are told, until your health care provider says that it is safe. This information is not intended to replace advice given to you by your health care provider. Make sure you discuss any questions you have with your health care provider. Document Revised: 07/08/2017 Document Reviewed: 07/08/2017 Elsevier Patient Education  2020 [employer]  

## 2020-05-10 NOTE — Op Note (Signed)
Patient name: Dennis Macias MRN: [id number] DOB: 08-15-1950 Sex: male  05/10/2020 Pre-operative Diagnosis: Right leg ulcer Post-operative diagnosis:  Same Surgeon:  [name] Procedure Performed:  1.  Ultrasound-guided access, left femoral artery  2.  Abdominal gram  3.  Right lower extremity runoff  4.  Angioplasty, right posterior tibial artery  5.  Conscious sedation, 88 minutes  6.  Closure device, Pro-glide    Indications: The patient is status post left below-knee amputation.  He developed a wound on his right toe.  He comes in today for further evaluation and possible intervention  Procedure:  The patient was identified in the holding area and taken to room 8.  The patient was then placed supine on the table and prepped and draped in the usual sterile fashion.  A time out was called.  Conscious sedation was administered with the use of IV fentanyl and Versed under continuous physician and nurse monitoring.  Heart rate, blood pressure, and oxygen saturation were continuously monitored.  Total sedation time was 88 minutes.  Ultrasound was used to evaluate the left common femoral artery.  It was patent .  A digital ultrasound image was acquired.  A micropuncture needle was used to access the left common femoral artery under ultrasound guidance.  An 018 wire was advanced without resistance and a micropuncture sheath was placed.  The 018 wire was removed and a benson wire was placed.  The micropuncture sheath was exchanged for a 5 french sheath.  An omniflush catheter was advanced over the wire to the level of L-1.  An abdominal angiogram was obtained.  Next, using the omniflush catheter and a benson wire, the aortic bifurcation was crossed and the catheter was placed into theright external iliac artery and right runoff was obtained.    Findings:   Aortogram: No significant renal artery stenosis was identified.  The infrarenal abdominal aorta is calcified but patent without stenosis.   Bilateral common and external iliac arteries are widely patent.  Right Lower Extremity: Right common femoral and profundofemoral artery widely patent.  The superficial femoral artery is patent without stenosis although it is heavily calcified.  The popliteal artery is widely patent.  The anterior tibial artery is patent down to the ankle where it occludes.  The peroneal artery is diminutive.  The dominant vessel across the foot is the posterior tibial artery however there are multiple greater than 80% lesions.  Left Lower Extremity: Not evaluated  Intervention: After the above images were acquired the decision was made to proceed with intervention.  A 6 French 65 cm sheath was placed into the right superficial femoral artery.  The patient was fully heparinized.  I then used a V-18 wire and navigated this into the posterior tibial artery across the ankle.  I tried to advance a 3 mm Sterling balloon however I could not get past the lower leg.  I tried a smaller balloon and then switched out to an 014 system.  I tried multiple balloon sizes, including a 3 oh, 2 5, 2 of and 1 5 none of which were able to cross the lesion.  I then selected a 1 mm x 1.5 cm coronary balloon and was able to perform balloon angioplasty down to the calcaneus.  Once this was completed I again tried to pass the smaller balloons across the lesion down by the ankle but was unsuccessful.  At this point I elected to balloon the posterior tibial artery that I could reach.  This was  a total length of approximately 240 cm.  Follow-up imaging showed resolution of the stenosis within the treated segment.  There was better opacification of the vessels out onto the foot.  Catheters and wires were removed.  The groin was closed with a Pro-glide.  There were no immediate complications  Impression:  #1  No significant outflow or inflow disease  #2  Diffuse tibial disease.  The dominant vessel across the ankle is the posterior tibial artery.  This has  multiple levels of greater than 80% stenosis.  I was able to perform balloon angioplasty using a 2.5 mm balloon from the ankle up to the origin of the posterior tibial artery.  I tried to cross the ankle but could not get a balloon to cross.    Theotis Burrow, M.D., [facility] [place] Office: [phone] Pager:  [phone]

## 2020-05-11 ENCOUNTER — Encounter (HOSPITAL_COMMUNITY): Payer: Self-pay | Admitting: Surgery

## 2020-05-12 DIAGNOSIS — N2581 Secondary hyperparathyroidism of renal origin: Secondary | ICD-10-CM | POA: Diagnosis not present

## 2020-05-12 DIAGNOSIS — N186 End stage renal disease: Secondary | ICD-10-CM | POA: Diagnosis not present

## 2020-05-12 DIAGNOSIS — Z992 Dependence on renal dialysis: Secondary | ICD-10-CM | POA: Diagnosis not present

## 2020-05-14 DIAGNOSIS — Z992 Dependence on renal dialysis: Secondary | ICD-10-CM | POA: Diagnosis not present

## 2020-05-14 DIAGNOSIS — N2581 Secondary hyperparathyroidism of renal origin: Secondary | ICD-10-CM | POA: Diagnosis not present

## 2020-05-14 DIAGNOSIS — N186 End stage renal disease: Secondary | ICD-10-CM | POA: Diagnosis not present

## 2020-05-16 DIAGNOSIS — Z452 Encounter for adjustment and management of vascular access device: Secondary | ICD-10-CM | POA: Diagnosis not present

## 2020-05-17 ENCOUNTER — Telehealth: Payer: Medicare PPO

## 2020-05-17 DIAGNOSIS — N186 End stage renal disease: Secondary | ICD-10-CM | POA: Diagnosis not present

## 2020-05-17 DIAGNOSIS — N2581 Secondary hyperparathyroidism of renal origin: Secondary | ICD-10-CM | POA: Diagnosis not present

## 2020-05-17 DIAGNOSIS — Z992 Dependence on renal dialysis: Secondary | ICD-10-CM | POA: Diagnosis not present

## 2020-05-19 DIAGNOSIS — N2581 Secondary hyperparathyroidism of renal origin: Secondary | ICD-10-CM | POA: Diagnosis not present

## 2020-05-19 DIAGNOSIS — Z992 Dependence on renal dialysis: Secondary | ICD-10-CM | POA: Diagnosis not present

## 2020-05-19 DIAGNOSIS — N186 End stage renal disease: Secondary | ICD-10-CM | POA: Diagnosis not present

## 2020-05-21 DIAGNOSIS — Z992 Dependence on renal dialysis: Secondary | ICD-10-CM | POA: Diagnosis not present

## 2020-05-21 DIAGNOSIS — N2581 Secondary hyperparathyroidism of renal origin: Secondary | ICD-10-CM | POA: Diagnosis not present

## 2020-05-21 DIAGNOSIS — N186 End stage renal disease: Secondary | ICD-10-CM | POA: Diagnosis not present

## 2020-05-24 DIAGNOSIS — Z992 Dependence on renal dialysis: Secondary | ICD-10-CM | POA: Diagnosis not present

## 2020-05-24 DIAGNOSIS — N2581 Secondary hyperparathyroidism of renal origin: Secondary | ICD-10-CM | POA: Diagnosis not present

## 2020-05-24 DIAGNOSIS — N186 End stage renal disease: Secondary | ICD-10-CM | POA: Diagnosis not present

## 2020-05-26 DIAGNOSIS — Z992 Dependence on renal dialysis: Secondary | ICD-10-CM | POA: Diagnosis not present

## 2020-05-26 DIAGNOSIS — N186 End stage renal disease: Secondary | ICD-10-CM | POA: Diagnosis not present

## 2020-05-26 DIAGNOSIS — N2581 Secondary hyperparathyroidism of renal origin: Secondary | ICD-10-CM | POA: Diagnosis not present

## 2020-05-28 DIAGNOSIS — N186 End stage renal disease: Secondary | ICD-10-CM | POA: Diagnosis not present

## 2020-05-28 DIAGNOSIS — N2581 Secondary hyperparathyroidism of renal origin: Secondary | ICD-10-CM | POA: Diagnosis not present

## 2020-05-28 DIAGNOSIS — Z992 Dependence on renal dialysis: Secondary | ICD-10-CM | POA: Diagnosis not present

## 2020-05-30 DIAGNOSIS — N2581 Secondary hyperparathyroidism of renal origin: Secondary | ICD-10-CM | POA: Diagnosis not present

## 2020-05-30 DIAGNOSIS — N186 End stage renal disease: Secondary | ICD-10-CM | POA: Diagnosis not present

## 2020-05-30 DIAGNOSIS — Z992 Dependence on renal dialysis: Secondary | ICD-10-CM | POA: Diagnosis not present

## 2020-06-01 DIAGNOSIS — N2581 Secondary hyperparathyroidism of renal origin: Secondary | ICD-10-CM | POA: Diagnosis not present

## 2020-06-01 DIAGNOSIS — N186 End stage renal disease: Secondary | ICD-10-CM | POA: Diagnosis not present

## 2020-06-01 DIAGNOSIS — Z992 Dependence on renal dialysis: Secondary | ICD-10-CM | POA: Diagnosis not present

## 2020-06-03 ENCOUNTER — Telehealth: Payer: Medicare PPO

## 2020-06-03 MED FILL — CLOPIDOGREL 75 MG TABLET: 75 | 30 days supply | Qty: 30 | Fill #1

## 2020-06-04 DIAGNOSIS — N186 End stage renal disease: Secondary | ICD-10-CM | POA: Diagnosis not present

## 2020-06-04 DIAGNOSIS — Z992 Dependence on renal dialysis: Secondary | ICD-10-CM | POA: Diagnosis not present

## 2020-06-04 DIAGNOSIS — N2581 Secondary hyperparathyroidism of renal origin: Secondary | ICD-10-CM | POA: Diagnosis not present

## 2020-06-07 ENCOUNTER — Other Ambulatory Visit: Payer: Self-pay

## 2020-06-07 DIAGNOSIS — N2581 Secondary hyperparathyroidism of renal origin: Secondary | ICD-10-CM | POA: Diagnosis not present

## 2020-06-07 DIAGNOSIS — E1122 Type 2 diabetes mellitus with diabetic chronic kidney disease: Secondary | ICD-10-CM | POA: Diagnosis not present

## 2020-06-07 DIAGNOSIS — I739 Peripheral vascular disease, unspecified: Secondary | ICD-10-CM

## 2020-06-07 DIAGNOSIS — N186 End stage renal disease: Secondary | ICD-10-CM | POA: Diagnosis not present

## 2020-06-07 DIAGNOSIS — Z992 Dependence on renal dialysis: Secondary | ICD-10-CM | POA: Diagnosis not present

## 2020-06-08 ENCOUNTER — Telehealth: Payer: Medicare PPO

## 2020-06-08 DIAGNOSIS — Z89519 Acquired absence of unspecified leg below knee: Secondary | ICD-10-CM | POA: Diagnosis not present

## 2020-06-08 DIAGNOSIS — M6281 Muscle weakness (generalized): Secondary | ICD-10-CM | POA: Diagnosis not present

## 2020-06-09 ENCOUNTER — Ambulatory Visit: Payer: Medicare PPO | Admitting: *Deleted

## 2020-06-09 DIAGNOSIS — N186 End stage renal disease: Secondary | ICD-10-CM

## 2020-06-09 DIAGNOSIS — E785 Hyperlipidemia, unspecified: Secondary | ICD-10-CM

## 2020-06-09 DIAGNOSIS — I2510 Atherosclerotic heart disease of native coronary artery without angina pectoris: Secondary | ICD-10-CM

## 2020-06-09 DIAGNOSIS — N2581 Secondary hyperparathyroidism of renal origin: Secondary | ICD-10-CM | POA: Diagnosis not present

## 2020-06-09 DIAGNOSIS — Z992 Dependence on renal dialysis: Secondary | ICD-10-CM | POA: Diagnosis not present

## 2020-06-09 DIAGNOSIS — E11319 Type 2 diabetes mellitus with unspecified diabetic retinopathy without macular edema: Secondary | ICD-10-CM

## 2020-06-09 DIAGNOSIS — I5042 Chronic combined systolic (congestive) and diastolic (congestive) heart failure: Secondary | ICD-10-CM

## 2020-06-09 NOTE — Chronic Care Management (AMB) (Signed)
Chronic Care Management   Follow Up Note   06/09/2020 Name: Dennis Macias MRN: [id number] DOB: [birth year]  Referred by: [name], MD Reason for referral : Chronic Care Management (NIDDM, CAD, HF, HTN, HLD, ESRD, s/p left BKA on 03/31/20)   Dennis Macias is a 69 y.o. year old male who is a primary care patient of [name], [name], MD. The CCM team was consulted for assistance with chronic disease management and care coordination needs.    Review of patient status, including review of consultants reports, relevant laboratory and other test results, and collaboration with appropriate care team members and the patient's provider was performed as part of comprehensive patient evaluation and provision of chronic care management services.    SDOH (Social Determinants of Health) assessments performed: No See Care Plan activities for detailed interventions related to [facility])     Outpatient Encounter Medications as of 06/09/2020  Medication Sig  . Accu-Chek FastClix Lancets MISC Check blood sugar up to 7 times a week as instructed  . acetaminophen (TYLENOL) 500 MG tablet Take 500 mg by mouth every 6 (six) hours as needed for moderate pain or headache.  Marland Kitchen aspirin EC 81 MG tablet Take 1 tablet (81 mg total) by mouth daily.  Marland Kitchen atorvastatin (LIPITOR) 80 MG tablet Take 1 tablet (80 mg total) by mouth at bedtime. IM program  . Blood Glucose Monitoring Suppl (ACCU-CHEK GUIDE) w/Device KIT 1 each by Does not apply route daily. Check blood sugar as instructed up to 7 times a week  . clopidogrel (PLAVIX) 75 MG tablet Take 1 tablet (75 mg total) by mouth daily.  . dorzolamide-timolol (COSOPT) 22.3-6.8 MG/ML ophthalmic solution Place 1 drop into the right eye 2 (two) times daily.  . finasteride (PROSCAR) 5 MG tablet Take 1 tablet (5 mg total) by mouth daily.  Marland Kitchen glucose blood (ACCU-CHEK GUIDE) test strip Check blood sugar up to 7 times a week as instructed  . lidocaine-prilocaine (EMLA) cream Apply 1  application topically daily as needed (prior to fistula use).  Marland Kitchen oxyCODONE-acetaminophen (PERCOCET/ROXICET) 5-325 MG tablet Take 1 tablet by mouth every 8 (eight) hours as needed for moderate pain.   . polyethylene glycol (MIRALAX / GLYCOLAX) 17 g packet Take 17 g by mouth daily as needed for moderate constipation.   . sevelamer carbonate (RENVELA) 800 MG tablet Take 800 mg by mouth See admin instructions. Take 800 mg by mouth three times a day with food on Sun/Mon/Wed/Fri and two times a day on Tues/Thurs/Sat  . vitamin B-12 (CYANOCOBALAMIN) 1000 MCG tablet Take 1 tablet (1,000 mcg total) by mouth daily.   No facility-administered encounter medications on file as of 06/09/2020.     Objective:  BP Readings from Last 3 Encounters:  05/10/20 (!) 189/64  05/02/20 122/70  05/02/20 129/67   Lab Results  Component Value Date   CHOL 128 02/18/2020   HDL 62 02/18/2020   LDLCALC 61 02/18/2020   TRIG 27 02/18/2020   CHOLHDL 2.1 02/18/2020    Goals Addressed              This Visit's Progress     Patient Stated   .  Significant other stated "Dollie is doing OK from having his left leg amputated but he's not able to walk on his right leg because he has a sore on his foot." (pt-stated)        CARE PLAN ENTRY (see longitudinal plan of care for additional care plan information)  Current Barriers:  . Chronic Disease  Management support and education needs related to ESRD, DM, HTN, HLD, HF and CAD- spoke with patient's significant other and caregiver Dennis Macias via phone, she says patient continues to receive home health services of physical therapy from [facility] twice weekly but patient is not progressing and is unable to bear weight on right leg, she says he is no longer receiving [facility] services, [name] said patient's arteriogram on 11/1 to evaluate blood flow to right foot was "OK", she states patient mailed a completed CAP application to [facility] and is requesting the CCM BSW assist with  ensuring patient's clinic provider completes the provider packet that was faxed to the clinic, [name]  became tearful stating she has almost lost her job with [employer] due to tardiness and absence related to caregiver responsibilities  Nurse Case Manager Clinical Goal(s):  Over the next 30 -60 days, patient's significant other  will:   Work with the care management team to address educational, disease management, and care coordination needs   Begin or continue self health monitoring activities as directed -  take medications as directed  Call provider office for new or worsened signs and symptoms related to CHF, CAD, HTN, HLD, DMII and ESRD  Call care management team with questions or concerns  Verbalize basic understanding of patient centered plan of care established today  Interventions:  . Inter-disciplinary care team collaboration (see longitudinal plan of care)- at significant other's request will ask CCM BSW to assist with ensuring clinic provider completes Community Assistance Program (CAP) provider packet that was faxed to the clinic . Via significant other and caregiver Dennis Macias, assessed patient's current clinical status in regards to HTN, DM, and s/p left BKA on 03/31/20 . Reviewed medications with patient's significant other and assessed medication taking behavior;  if indicated, discussed importance of medication adherence  . Verified that patient is receiving home health services and assessed progress with HHPT  . Reviewed scheduled/upcoming provider appointments including: follow up with Dr Trula Slade on 06/13/20 .  Discussed plans with patient for ongoing care management follow up and provided patient with direct contact information for care management team  Patient Self Care Activities:  . Patient takes medications as prescribed with caregiver's assistance . Attends all scheduled provider appointments . Caregiver calls pharmacy for medication refills . Caregiver calls  provider office for new concerns or questions . Unable to self administer medications as prescribed . Unable to perform ADLs independently . Unable to perform IADLs independently  Please see past updates related to this goal by clicking on the "Past Updates" button in the selected goal          Plan:   The care management team will reach out to the patient again over the next 30-60 days.    Kelli Churn RN, CCM, [facility] RN Care Manager [phone]

## 2020-06-09 NOTE — Patient Instructions (Signed)
Visit Information I will ask Dennis Macias the clinic social worker to help with the provider's portion of the CAP application.  Goals Addressed              This Visit's Progress     Patient Stated   .  Significant other stated "Dennis Macias is doing OK from having his left leg amputated but he's not able to walk on his right leg because he has a sore on his foot." (pt-stated)        CARE PLAN ENTRY (see longitudinal plan of care for additional care plan information)  Current Barriers:  . Chronic Disease Management support and education needs related to ESRD, DM, HTN, HLD, HF and CAD- spoke with patient's significant other and caregiver Dennis Macias via phone, she says patient continues to receive home health services of physical therapy from [facility] twice weekly but patient is not progressing and is unable to bear weight on right leg, she says he is no longer receiving [facility] services, Dennis Macias said patient's arteriogram on 11/1 to evaluate blood flow to right foot was "OK", she states patient mailed a completed CAP application to [facility] and is requesting the CCM BSW assist with ensuring patient's clinic provider completes the provider packet that was faxed to the clinic, Dennis Macias  became tearful stating she has almost lost her job with [employer] due to tardiness and absence related to caregiver responsibilities  Nurse Case Manager Clinical Goal(s):  Over the next 30 -60 days, patient's significant other  will:   Work with the care management team to address educational, disease management, and care coordination needs   Begin or continue self health monitoring activities as directed -  take medications as directed  Call provider office for new or worsened signs and symptoms related to CHF, CAD, HTN, HLD, DMII and ESRD  Call care management team with questions or concerns  Verbalize basic understanding of patient centered plan of care established today  Interventions:  . Inter-disciplinary care  team collaboration (see longitudinal plan of care)- at significant other's request will ask CCM BSW to assist with ensuring clinic provider completes Community Assistance Program (CAP) provider packet that was faxed to the clinic . Via significant other and caregiver Dennis Macias, assessed patient's current clinical status in regards to HTN, DM, and s/p left BKA on 03/31/20 . Reviewed medications with patient's significant other and assessed medication taking behavior;  if indicated, discussed importance of medication adherence  . Verified that patient is receiving home health services and assessed progress with HHPT  . Reviewed scheduled/upcoming provider appointments including: follow up with Dr Trula Slade on 06/13/20 .  Discussed plans with patient for ongoing care management follow up and provided patient with direct contact information for care management team  Patient Self Care Activities:  . Patient takes medications as prescribed with caregiver's assistance . Attends all scheduled provider appointments . Caregiver calls pharmacy for medication refills . Caregiver calls provider office for new concerns or questions . Unable to self administer medications as prescribed . Unable to perform ADLs independently . Unable to perform IADLs independently  Please see past updates related to this goal by clicking on the "Past Updates" button in the selected goal         The patient verbalized understanding of instructions, educational materials, and care plan provided today and declined offer to receive copy of patient instructions, educational materials, and care plan.   The care management team will reach out to the patient again over  the next 30-60 days.   Dennis Churn RN, CCM, [facility] RN Care Manager [phone]

## 2020-06-10 NOTE — Progress Notes (Signed)
Internal Medicine Clinic Resident  I have personally reviewed this encounter including the documentation in this note and/or discussed this patient with the care management provider. I will address any urgent items identified by the care management provider and will communicate my actions to the patient's PCP. I have reviewed the patient's CCM visit with my supervising attending, Dr Raines.  Altheria Shadoan, DO 06/10/2020   

## 2020-06-11 DIAGNOSIS — N2581 Secondary hyperparathyroidism of renal origin: Secondary | ICD-10-CM | POA: Diagnosis not present

## 2020-06-11 DIAGNOSIS — N186 End stage renal disease: Secondary | ICD-10-CM | POA: Diagnosis not present

## 2020-06-11 DIAGNOSIS — Z992 Dependence on renal dialysis: Secondary | ICD-10-CM | POA: Diagnosis not present

## 2020-06-13 ENCOUNTER — Other Ambulatory Visit: Payer: Self-pay

## 2020-06-13 ENCOUNTER — Ambulatory Visit (HOSPITAL_COMMUNITY)
Admission: RE | Admit: 2020-06-13 | Discharge: 2020-06-13 | Disposition: A | Discharge status: Home / Self Care | Payer: Medicare PPO | Source: Ambulatory Visit | Attending: Surgery | Admitting: Surgery

## 2020-06-13 ENCOUNTER — Ambulatory Visit (INDEPENDENT_AMBULATORY_CARE_PROVIDER_SITE_OTHER)
Admission: RE | Admit: 2020-06-13 | Discharge: 2020-06-13 | Disposition: A | Discharge status: Home / Self Care | Payer: Medicare PPO | Source: Ambulatory Visit | Attending: Surgery | Admitting: Surgery

## 2020-06-13 ENCOUNTER — Ambulatory Visit (INDEPENDENT_AMBULATORY_CARE_PROVIDER_SITE_OTHER): Payer: Medicare PPO | Admitting: Physician Assistant

## 2020-06-13 VITALS — BP 123/70 | HR 66 | Temp 97.4°F | Resp 20 | Ht 72.0 in

## 2020-06-13 DIAGNOSIS — I7025 Atherosclerosis of native arteries of other extremities with ulceration: Secondary | ICD-10-CM

## 2020-06-13 DIAGNOSIS — I739 Peripheral vascular disease, unspecified: Secondary | ICD-10-CM | POA: Insufficient documentation

## 2020-06-13 NOTE — Progress Notes (Signed)
Postoperative Visit (Angio)   History of Present Illness   Dennis Macias is a 69 y.o. male who presents status post right lower extremity arteriogram with angioplasty of right PTA due to right fifth toe ulceration.  Surgical history also significant for left below the knee amputation by Dr. Trula Slade on March 31, 2020.  Patient is here with his wife who states the ulceration of his right fifth toe looks much better since intervention.  He is taking aspirin and Plavix daily.  He denies tobacco use.  He is being seen by [employer] for his left below the knee amputation which he is now wearing a shrinking sock.  He has some scabbing areas however denies any drainage.  He denies fevers, chills, nausea/vomiting.   Current Outpatient Medications  Medication Sig Dispense Refill  . Accu-Chek FastClix Lancets MISC Check blood sugar up to 7 times a week as instructed 102 each 3  . acetaminophen (TYLENOL) 500 MG tablet Take 500 mg by mouth every 6 (six) hours as needed for moderate pain or headache.    Marland Kitchen aspirin EC 81 MG tablet Take 1 tablet (81 mg total) by mouth daily.    Marland Kitchen atorvastatin (LIPITOR) 80 MG tablet Take 1 tablet (80 mg total) by mouth at bedtime. IM program 90 tablet 3  . Blood Glucose Monitoring Suppl (ACCU-CHEK GUIDE) w/Device KIT 1 each by Does not apply route daily. Check blood sugar as instructed up to 7 times a week 1 kit 0  . clopidogrel (PLAVIX) 75 MG tablet Take 1 tablet (75 mg total) by mouth daily. 30 tablet 11  . dorzolamide-timolol (COSOPT) 22.3-6.8 MG/ML ophthalmic solution Place 1 drop into the right eye 2 (two) times daily. 10 mL 10  . finasteride (PROSCAR) 5 MG tablet Take 1 tablet (5 mg total) by mouth daily. 90 tablet 1  . glucose blood (ACCU-CHEK GUIDE) test strip Check blood sugar up to 7 times a week as instructed 100 each 3  . lidocaine-prilocaine (EMLA) cream Apply 1 application topically daily as needed (prior to fistula use).    Marland Kitchen oxyCODONE-acetaminophen  (PERCOCET/ROXICET) 5-325 MG tablet Take 1 tablet by mouth every 8 (eight) hours as needed for moderate pain.     . polyethylene glycol (MIRALAX / GLYCOLAX) 17 g packet Take 17 g by mouth daily as needed for moderate constipation.     . sevelamer carbonate (RENVELA) 800 MG tablet Take 800 mg by mouth See admin instructions. Take 800 mg by mouth three times a day with food on Sun/Mon/Wed/Fri and two times a day on Tues/Thurs/Sat    . vitamin B-12 (CYANOCOBALAMIN) 1000 MCG tablet Take 1 tablet (1,000 mcg total) by mouth daily. 30 tablet 0   No current facility-administered medications for this visit.    ROS: 10 system ROS otherwise negative  For VQI Use Only   PRE-ADM LIVING: Home  AMB STATUS: Wheelchair   Physical Examination   Vitals:   06/13/20 1500  BP: 123/70  Pulse: 66  Resp: 20  Temp: (!) 97.4 F (36.3 C)  TempSrc: Temporal  SpO2: 98%  Height: 6' (1.829 m)   Body mass index is 19.94 kg/m.  General Alert, O x 3, WD, NAD  Pulmonary Sym exp, good B air movt,  Cardiac RRR, Nl S1, S2,   Vascular Vessel Right Left  Radial Palpable Palpable  PT Not palpable BKA  DP Not palpable BKA    Gastrointestinal soft, non-distended, non-tender to palpation,   Musculoskeletal  dry necrotic 5 x  5 mm ulceration at the base of the right fifth toe without surrounding erythema; left BKA incision with 3 areas of dry eschar debrided; mid incision eschar left in place due to proximity to tibia  Neurologic  Pain and light touch intact in extremities , Motor exam as listed above    Medical Decision Making    Dennis Macias is a 69 y.o. male who presents s/p right PTA angioplasty due to right fifth toe ulceration  -Right PTA widely patent based on arterial duplex; subjectively ulcer has decreased in size -Continue aspirin and Plavix daily -Left BKA incision debrided some in the office; instructed patient's wife to cleanse incision with soap and water at least daily and pat to dry -Patient  will return in 1 month to recheck right fifth toe ulcer as well as left BKA incision   Dagoberto Ligas PA-C [facility] Office: [facility] MD: Trula Slade

## 2020-06-14 ENCOUNTER — Ambulatory Visit: Payer: Medicare PPO

## 2020-06-14 ENCOUNTER — Telehealth: Payer: Self-pay

## 2020-06-14 DIAGNOSIS — N2581 Secondary hyperparathyroidism of renal origin: Secondary | ICD-10-CM | POA: Diagnosis not present

## 2020-06-14 DIAGNOSIS — E785 Hyperlipidemia, unspecified: Secondary | ICD-10-CM

## 2020-06-14 DIAGNOSIS — N186 End stage renal disease: Secondary | ICD-10-CM | POA: Diagnosis not present

## 2020-06-14 DIAGNOSIS — Z992 Dependence on renal dialysis: Secondary | ICD-10-CM | POA: Diagnosis not present

## 2020-06-14 DIAGNOSIS — E11319 Type 2 diabetes mellitus with unspecified diabetic retinopathy without macular edema: Secondary | ICD-10-CM

## 2020-06-14 NOTE — Chronic Care Management (AMB) (Addendum)
Chronic Care Management    Clinical Social Work Follow Up Note  06/14/2020 Name: Dennis Macias MRN: [id number] DOB: Sep 28, 1950  Dennis Macias is a 69 y.o. year old male who is a primary care patient of [name], Rylee, MD. The CCM team was consulted for assistance with Level of Care Concerns.   Review of patient status, including review of consultants reports, other relevant assessments, and collaboration with appropriate care team members and the patient's provider was performed as part of comprehensive patient evaluation and provision of chronic care management services.    SDOH (Social Determinants of Health) assessments performed: No    Outpatient Encounter Medications as of 06/14/2020  Medication Sig  . Accu-Chek FastClix Lancets MISC Check blood sugar up to 7 times a week as instructed  . acetaminophen (TYLENOL) 500 MG tablet Take 500 mg by mouth every 6 (six) hours as needed for moderate pain or headache.  Marland Kitchen aspirin EC 81 MG tablet Take 1 tablet (81 mg total) by mouth daily.  Marland Kitchen atorvastatin (LIPITOR) 80 MG tablet Take 1 tablet (80 mg total) by mouth at bedtime. IM program  . Blood Glucose Monitoring Suppl (ACCU-CHEK GUIDE) w/Device KIT 1 each by Does not apply route daily. Check blood sugar as instructed up to 7 times a week  . clopidogrel (PLAVIX) 75 MG tablet Take 1 tablet (75 mg total) by mouth daily.  . dorzolamide-timolol (COSOPT) 22.3-6.8 MG/ML ophthalmic solution Place 1 drop into the right eye 2 (two) times daily.  . finasteride (PROSCAR) 5 MG tablet Take 1 tablet (5 mg total) by mouth daily.  Marland Kitchen glucose blood (ACCU-CHEK GUIDE) test strip Check blood sugar up to 7 times a week as instructed  . lidocaine-prilocaine (EMLA) cream Apply 1 application topically daily as needed (prior to fistula use).  Marland Kitchen oxyCODONE-acetaminophen (PERCOCET/ROXICET) 5-325 MG tablet Take 1 tablet by mouth every 8 (eight) hours as needed for moderate pain.   . polyethylene glycol (MIRALAX / GLYCOLAX) 17 g  packet Take 17 g by mouth daily as needed for moderate constipation.   . sevelamer carbonate (RENVELA) 800 MG tablet Take 800 mg by mouth See admin instructions. Take 800 mg by mouth three times a day with food on Sun/Mon/Wed/Fri and two times a day on Tues/Thurs/Sat  . vitamin B-12 (CYANOCOBALAMIN) 1000 MCG tablet Take 1 tablet (1,000 mcg total) by mouth daily.   No facility-administered encounter medications on file as of 06/14/2020.       Patient Care Plan: Social Work    Problem Identified: Barriers to Treatment     Long-Range Goal: Get added to wait list for CAP services   Start Date: 06/14/2020  Expected End Date: 09/12/2020  This Visit's Progress: On track  Priority: High  Note:   Current Barriers:  . Level of care concerns . Patient wishes to be added to wait list for CAP services.  Application process has been started by significant other but she is requesting assistance to ensure all documentation has been completed.   Clinical Social Work Clinical Goal(s):  Marland Kitchen Over the next 90 days, patient will work with SW to address concerns related to CAP documentation   Interventions: . Inter-disciplinary care team collaboration (see longitudinal plan of care) . Ensured application for services has been completed by patient/significant other and submitted to [place] . Collaborated with [place], [employer], to ensure that CAP worksheet was completed by provider and submitted to [facility] . Left message in [facility] mailbox to inquire if additional documentation is needed from  provider; requested return call . Called patient's significant other, Dennis Macias, back to inform her that worksheet was faxed to [facility] on 05/23/20 but clinic has not received any additional documentation to be completed/submitted . Received confirmation from [facility] DHHS that patient has been added to CAP wait list which can range from 6 months to two years . Contacted significant other to inform of wait lis status  Patient  Goals/Self Care Activities:  . Patient will attend all scheduled provider appointments . Patient will call provider office for new concerns or questions . Patient will work with BSW to address care coordination needs and will continue to work with the clinical team to address health care and disease management related needs.    Initial goal documentation       No additional SW follow up at this time.       Dennis Macias, [place] Coordination Social Worker [place] [phone]

## 2020-06-14 NOTE — Telephone Encounter (Signed)
  Chronic Care Management   Outreach Note  06/14/2020 Name: Dennis Macias MRN: [id number] DOB: 1950-09-28  Referred by: [name], MD Reason for referral : Care Coordination (CAP documentation )   Dennis Macias is enrolled in a Managed Medicaid Health Plan: No  An unsuccessful telephone outreach was attempted today. The patient was referred to the case management team for assistance with care management and care coordination.   Follow Up Plan: A HIPAA compliant phone message was left for the patient on home and mobile numbers providing contact information and requesting a return call.     Ronn Melena, [place] Coordination Social Worker [place] [phone]

## 2020-06-14 NOTE — Patient Instructions (Addendum)
Social Worker Visit Information Patient Care Plan: Social Work    Problem Identified: Barriers to Treatment     Long-Range Goal: Get added to wait list for CAP services   Start Date: 06/14/2020  Expected End Date: 09/12/2020  This Visit's Progress: On track  Priority: High  Note:   Current Barriers:  . Level of care concerns . Patient wishes to be added to wait list for CAP services.  Application process has been started by significant other but she is requesting assistance to ensure all documentation has been completed.   Clinical Social Work Clinical Goal(s):  Marland Kitchen Over the next 90 days, patient will work with SW to address concerns related to CAP documentation   Interventions: . Inter-disciplinary care team collaboration (see longitudinal plan of care) . Ensured application for services has been completed by patient/significant other and submitted to [place] . Collaborated with [place], [employer], to ensure that CAP worksheet was completed by provider and submitted to [facility] . Left message in [facility] mailbox to inquire if additional documentation is needed from provider; requested return call . Called patient's significant other, Dennis Macias, back to inform her that worksheet was faxed to [facility] on 05/23/20 but clinic has not received any additional documentation to be completed/submitted . Received confirmation from [facility] DHHS that patient has been added to CAP wait list which can range from 6 months to two years . Contacted significant other to inform of wait lis status .   Patient Goals/Self Care Activities:  . Patient will attend all scheduled provider appointments . Patient will call provider office for new concerns or questions . Patient will work with BSW to address care coordination needs and will continue to work with the clinical team to address health care and disease management related needs.           Follow Up Plan: No additional SW follow up at this time     [employer], [place] Worker [place] [phone]

## 2020-06-14 NOTE — Progress Notes (Signed)
Internal Medicine Clinic Resident  I have personally reviewed this encounter including the documentation in this note and/or discussed this patient with the care management provider. I will address any urgent items identified by the care management provider and will communicate my actions to the patient's PCP. I have reviewed the patient's CCM visit with my supervising attending, Dr Vincent.  Matt Christee Mervine, MD 06/14/2020   

## 2020-06-15 ENCOUNTER — Encounter: Payer: Self-pay | Admitting: *Deleted

## 2020-06-15 NOTE — Progress Notes (Signed)
COMPLETED

## 2020-06-15 NOTE — Progress Notes (Signed)
Internal Medicine Clinic Attending  CCM services provided by the care management provider and their documentation were discussed with Dr. Johnson. We reviewed the pertinent findings, urgent action items addressed by the resident and non-urgent items to be addressed by the PCP.  I agree with the assessment, diagnosis, and plan of care documented in the CCM and resident's note.  Brodie Correll Thomas Adan Baehr, MD 06/15/2020  

## 2020-06-15 NOTE — Progress Notes (Unsigned)

## 2020-06-15 NOTE — Progress Notes (Unsigned)
Things That May Be Affecting Your Health:  Alcohol X Hearing loss  Pain    Depression X Home Safety  Sexual Health   Diabetes  Lack of physical activity  Stress  X Difficulty with daily activities  Loneliness  Tiredness   Drug use  Medicines  Tobacco use   Falls  Motor Vehicle Safety  Weight  X Food choices  Oral Health  Other    YOUR PERSONALIZED HEALTH PLAN : 1. Schedule your next subsequent Medicare Wellness visit in one year 2. Attend all of your regular appointments to address your medical issues 3. Complete the preventative screenings and services   Annual Wellness Visit   Medicare Covered Preventative Screenings and [place] Men and Women Who How Often Need? Date of Last Service Action  Abdominal Aortic Aneurysm Adults with AAA risk factors Once X    Alcohol Misuse and Counseling All Adults Screening once a year if no alcohol misuse. Counseling up to 4 face to face sessions.     Bone Density Measurement  Adults at risk for osteoporosis Once every 2 yrs     Lipid Panel Z13.6 All adults without CV disease Once every 5 yrs     Colorectal Cancer   Stool sample or  Colonoscopy All adults 74 and older   Once every year  Every 10 years     Depression All Adults Once a year X Today   Diabetes Screening Blood glucose, post glucose load, or GTT Z13.1  All adults at risk  Pre-diabetics  Once per year  Twice per year     Diabetes  Self-Management Training All adults Diabetics 10 hrs first year; 2 hours subsequent years. Requires Copay     Glaucoma  Diabetics  Family history of glaucoma  African Americans 17 yrs +  Hispanic Americans 83 yrs + Annually - requires coppay X    Hepatitis C Z72.89 or F19.20  High Risk for HCV  Born between 1945 and 1965  Annually  Once     HIV Z11.4 All adults based on risk  Annually btw ages 57 & 49 regardless of risk  Annually > 65 yrs if at increased risk     Lung Cancer Screening Asymptomatic adults  aged 58-77 with 30 pack yr history and current smoker OR quit within the last 15 yrs Annually Must have counseling and shared decision making documentation before first screen     Medical Nutrition Therapy Adults with   Diabetes  Renal disease  Kidney transplant within past 3 yrs 3 hours first year; 2 hours subsequent years X    Obesity and Counseling All adults Screening once a year Counseling if BMI 30 or higher  Today   Tobacco Use Counseling Adults who use tobacco  Up to 8 visits in one year     Vaccines Z23  Hepatitis B  Influenza   Pneumonia  Adults   Once  Once every flu season  Two different vaccines separated by one year X    Next Annual Wellness Visit People with Medicare Every year X Today     Services & Screenings Women Who How Often Need  Date of Last Service Action  Mammogram  Z12.31 Women over 76 One baseline ages 49-39. Annually ager 40 yrs+     Pap tests All women Annually if high risk. Every 2 yrs for normal risk women     Screening for cervical cancer with   Pap (Z01.419 nl or Z01.411abnl) &  HPV Z11.51 Women aged 51 to 43 Once every 5 yrs     Screening pelvic and breast exams All women Annually if high risk. Every 2 yrs for normal risk women     Sexually Transmitted Diseases  Chlamydia  Gonorrhea  Syphilis All at risk adults Annually for non pregnant females at increased risk         [place] Men Who How Ofter Need  Date of Last Service Action  Prostate Cancer - DRE & PSA Men over 50 Annually.  DRE might require a copay.     Sexually Transmitted Diseases  Syphilis All at risk adults Annually for men at increased risk

## 2020-06-16 DIAGNOSIS — N186 End stage renal disease: Secondary | ICD-10-CM | POA: Diagnosis not present

## 2020-06-16 DIAGNOSIS — Z992 Dependence on renal dialysis: Secondary | ICD-10-CM | POA: Diagnosis not present

## 2020-06-16 DIAGNOSIS — N2581 Secondary hyperparathyroidism of renal origin: Secondary | ICD-10-CM | POA: Diagnosis not present

## 2020-06-17 ENCOUNTER — Telehealth: Payer: Medicare PPO

## 2020-06-18 DIAGNOSIS — N2581 Secondary hyperparathyroidism of renal origin: Secondary | ICD-10-CM | POA: Diagnosis not present

## 2020-06-18 DIAGNOSIS — N186 End stage renal disease: Secondary | ICD-10-CM | POA: Diagnosis not present

## 2020-06-18 DIAGNOSIS — Z992 Dependence on renal dialysis: Secondary | ICD-10-CM | POA: Diagnosis not present

## 2020-06-21 ENCOUNTER — Telehealth: Payer: Self-pay

## 2020-06-21 DIAGNOSIS — N186 End stage renal disease: Secondary | ICD-10-CM | POA: Diagnosis not present

## 2020-06-21 DIAGNOSIS — Z992 Dependence on renal dialysis: Secondary | ICD-10-CM | POA: Diagnosis not present

## 2020-06-21 DIAGNOSIS — N2581 Secondary hyperparathyroidism of renal origin: Secondary | ICD-10-CM | POA: Diagnosis not present

## 2020-06-21 NOTE — Telephone Encounter (Signed)
Received TC from Dennis Macias [facility] wanting to continue PT.  PT has Left BKA and they are waiting for wound to heal so he can be fitting for his prosthesis.  Wants to continue PT for:  1 X/week for 2 weeks 2X/week for 4 weeks To work on strength, balance, transfers, and mobility. VO given.  Please let me know if you agree. Thanks, SChaplin, RN,BSN

## 2020-06-21 NOTE — Telephone Encounter (Signed)
I agree

## 2020-06-22 ENCOUNTER — Ambulatory Visit: Payer: Medicare PPO | Admitting: Adult Health

## 2020-06-23 DIAGNOSIS — Z992 Dependence on renal dialysis: Secondary | ICD-10-CM | POA: Diagnosis not present

## 2020-06-23 DIAGNOSIS — N2581 Secondary hyperparathyroidism of renal origin: Secondary | ICD-10-CM | POA: Diagnosis not present

## 2020-06-23 DIAGNOSIS — N186 End stage renal disease: Secondary | ICD-10-CM | POA: Diagnosis not present

## 2020-06-23 NOTE — Progress Notes (Signed)
Internal Medicine Clinic Attending  CCM services provided by the care management provider and their documentation were reviewed with Dr. Nguyen.  We reviewed the pertinent findings, urgent action items addressed by the resident and non-urgent items to be addressed by the PCP.  I agree with the assessment, diagnosis, and plan of care documented in the CCM and resident's note.  Alexander N Raines, MD 06/23/2020  

## 2020-06-25 DIAGNOSIS — N186 End stage renal disease: Secondary | ICD-10-CM | POA: Diagnosis not present

## 2020-06-25 DIAGNOSIS — N2581 Secondary hyperparathyroidism of renal origin: Secondary | ICD-10-CM | POA: Diagnosis not present

## 2020-06-25 DIAGNOSIS — Z992 Dependence on renal dialysis: Secondary | ICD-10-CM | POA: Diagnosis not present

## 2020-06-28 DIAGNOSIS — Z992 Dependence on renal dialysis: Secondary | ICD-10-CM | POA: Diagnosis not present

## 2020-06-28 DIAGNOSIS — N186 End stage renal disease: Secondary | ICD-10-CM | POA: Diagnosis not present

## 2020-06-28 DIAGNOSIS — N2581 Secondary hyperparathyroidism of renal origin: Secondary | ICD-10-CM | POA: Diagnosis not present

## 2020-06-28 MED FILL — CLOPIDOGREL 75 MG TABLET: 75 | 30 days supply | Qty: 30 | Fill #2

## 2020-06-30 DIAGNOSIS — Z992 Dependence on renal dialysis: Secondary | ICD-10-CM | POA: Diagnosis not present

## 2020-06-30 DIAGNOSIS — N186 End stage renal disease: Secondary | ICD-10-CM | POA: Diagnosis not present

## 2020-06-30 DIAGNOSIS — N2581 Secondary hyperparathyroidism of renal origin: Secondary | ICD-10-CM | POA: Diagnosis not present

## 2020-07-03 DIAGNOSIS — Z992 Dependence on renal dialysis: Secondary | ICD-10-CM | POA: Diagnosis not present

## 2020-07-03 DIAGNOSIS — N2581 Secondary hyperparathyroidism of renal origin: Secondary | ICD-10-CM | POA: Diagnosis not present

## 2020-07-03 DIAGNOSIS — N186 End stage renal disease: Secondary | ICD-10-CM | POA: Diagnosis not present

## 2020-07-05 DIAGNOSIS — Z992 Dependence on renal dialysis: Secondary | ICD-10-CM | POA: Diagnosis not present

## 2020-07-05 DIAGNOSIS — N2581 Secondary hyperparathyroidism of renal origin: Secondary | ICD-10-CM | POA: Diagnosis not present

## 2020-07-05 DIAGNOSIS — N186 End stage renal disease: Secondary | ICD-10-CM | POA: Diagnosis not present

## 2020-07-07 DIAGNOSIS — N186 End stage renal disease: Secondary | ICD-10-CM | POA: Diagnosis not present

## 2020-07-07 DIAGNOSIS — Z992 Dependence on renal dialysis: Secondary | ICD-10-CM | POA: Diagnosis not present

## 2020-07-07 DIAGNOSIS — N2581 Secondary hyperparathyroidism of renal origin: Secondary | ICD-10-CM | POA: Diagnosis not present

## 2020-07-08 DIAGNOSIS — E1122 Type 2 diabetes mellitus with diabetic chronic kidney disease: Secondary | ICD-10-CM | POA: Diagnosis not present

## 2020-07-08 DIAGNOSIS — Z992 Dependence on renal dialysis: Secondary | ICD-10-CM | POA: Diagnosis not present

## 2020-07-08 DIAGNOSIS — N186 End stage renal disease: Secondary | ICD-10-CM | POA: Diagnosis not present

## 2020-07-10 DIAGNOSIS — Z992 Dependence on renal dialysis: Secondary | ICD-10-CM | POA: Diagnosis not present

## 2020-07-10 DIAGNOSIS — N2581 Secondary hyperparathyroidism of renal origin: Secondary | ICD-10-CM | POA: Diagnosis not present

## 2020-07-10 DIAGNOSIS — N186 End stage renal disease: Secondary | ICD-10-CM | POA: Diagnosis not present

## 2020-07-11 ENCOUNTER — Ambulatory Visit: Payer: Medicare PPO

## 2020-07-12 DIAGNOSIS — Z992 Dependence on renal dialysis: Secondary | ICD-10-CM | POA: Diagnosis not present

## 2020-07-12 DIAGNOSIS — N186 End stage renal disease: Secondary | ICD-10-CM | POA: Diagnosis not present

## 2020-07-12 DIAGNOSIS — N2581 Secondary hyperparathyroidism of renal origin: Secondary | ICD-10-CM | POA: Diagnosis not present

## 2020-07-14 DIAGNOSIS — N186 End stage renal disease: Secondary | ICD-10-CM | POA: Diagnosis not present

## 2020-07-14 DIAGNOSIS — Z992 Dependence on renal dialysis: Secondary | ICD-10-CM | POA: Diagnosis not present

## 2020-07-14 DIAGNOSIS — N2581 Secondary hyperparathyroidism of renal origin: Secondary | ICD-10-CM | POA: Diagnosis not present

## 2020-07-15 ENCOUNTER — Telehealth: Payer: Self-pay | Admitting: *Deleted

## 2020-07-15 ENCOUNTER — Telehealth: Payer: Medicare PPO

## 2020-07-15 ENCOUNTER — Telehealth: Payer: Self-pay

## 2020-07-15 NOTE — Telephone Encounter (Signed)
Received a TC from [place], [place] with [place].  He states patient was supposed to get his prosthetic leg this week, but will now get it next week.  PT is asking to move the visit that was scheduled for this week to the week of 1/31 (states they already have visits scheduled before w/o 1/31).  Working on transfer, strength, and balance.  VO given.  Will forward to [facility] team for agreement or denial. SChaplin, RN,BSN

## 2020-07-15 NOTE — Telephone Encounter (Signed)
  Chronic Care Management   Outreach Note  07/15/2020 Name: Dennis Macias MRN: [id number] DOB: 1950/08/23  Referred by: [name], MD Reason for referral : Chronic Care Management ( NIDDM, CAD, HF, HTN, HLD, ESRD, s/p left BKA on 03/31/20)   An unsuccessful telephone outreach was attempted today. The patient was referred to the case management team for assistance with care management and care coordination.   Follow Up Plan: A HIPAA compliant phone message was left for the patient providing contact information and requesting a return call.  The care management team will reach out to the patient again over the next 7-14 days.   Kelli Churn RN, CCM, [facility] RN Care Manager [phone]

## 2020-07-16 DIAGNOSIS — Z992 Dependence on renal dialysis: Secondary | ICD-10-CM | POA: Diagnosis not present

## 2020-07-16 DIAGNOSIS — N186 End stage renal disease: Secondary | ICD-10-CM | POA: Diagnosis not present

## 2020-07-16 DIAGNOSIS — N2581 Secondary hyperparathyroidism of renal origin: Secondary | ICD-10-CM | POA: Diagnosis not present

## 2020-07-18 NOTE — Telephone Encounter (Signed)
Agree 

## 2020-07-19 DIAGNOSIS — Z992 Dependence on renal dialysis: Secondary | ICD-10-CM | POA: Diagnosis not present

## 2020-07-19 DIAGNOSIS — N2581 Secondary hyperparathyroidism of renal origin: Secondary | ICD-10-CM | POA: Diagnosis not present

## 2020-07-19 DIAGNOSIS — N186 End stage renal disease: Secondary | ICD-10-CM | POA: Diagnosis not present

## 2020-07-20 ENCOUNTER — Telehealth: Payer: Medicare PPO

## 2020-07-20 ENCOUNTER — Telehealth: Payer: Self-pay | Admitting: *Deleted

## 2020-07-20 NOTE — Telephone Encounter (Signed)
  Chronic Care Management   Outreach Note  07/20/2020 Name: Dennis Macias MRN: [id number] DOB: 1951-06-01  Referred by: [name], MD Reason for referral : Chronic Care Management ( NIDDM, CAD, HF, HTN, HLD, ESRD, s/p left BKA on 03/31/20)   A second unsuccessful telephone outreach was attempted today. The patient was referred to the case management team for assistance with care management and care coordination.   Follow Up Plan: A HIPAA compliant phone message was left for the patient providing contact information and requesting a return call.  The care management team will reach out to the patient again over the next 7-14 days.   Kelli Churn RN, CCM, [facility] RN Care Manager [phone]

## 2020-07-21 ENCOUNTER — Other Ambulatory Visit: Payer: Self-pay | Admitting: *Deleted

## 2020-07-21 ENCOUNTER — Telehealth: Payer: Self-pay

## 2020-07-21 DIAGNOSIS — Z992 Dependence on renal dialysis: Secondary | ICD-10-CM | POA: Diagnosis not present

## 2020-07-21 DIAGNOSIS — I739 Peripheral vascular disease, unspecified: Secondary | ICD-10-CM

## 2020-07-21 DIAGNOSIS — N186 End stage renal disease: Secondary | ICD-10-CM | POA: Diagnosis not present

## 2020-07-21 DIAGNOSIS — N2581 Secondary hyperparathyroidism of renal origin: Secondary | ICD-10-CM | POA: Diagnosis not present

## 2020-07-21 NOTE — Telephone Encounter (Signed)
Patient's wife called to report 2 more ulcers on patient's foot. They are turning black, no drainage, swelling, odor, fever or pain. Advised her to keep his follow up appt on 1/31 - added ABIs to this appt, she knows to call sooner if any s/s of infection or rest pain develop.

## 2020-07-23 DIAGNOSIS — N186 End stage renal disease: Secondary | ICD-10-CM | POA: Diagnosis not present

## 2020-07-23 DIAGNOSIS — N2581 Secondary hyperparathyroidism of renal origin: Secondary | ICD-10-CM | POA: Diagnosis not present

## 2020-07-23 DIAGNOSIS — Z992 Dependence on renal dialysis: Secondary | ICD-10-CM | POA: Diagnosis not present

## 2020-07-25 ENCOUNTER — Encounter (INDEPENDENT_AMBULATORY_CARE_PROVIDER_SITE_OTHER): Payer: Medicare PPO | Admitting: Ophthalmology

## 2020-07-26 DIAGNOSIS — Z992 Dependence on renal dialysis: Secondary | ICD-10-CM | POA: Diagnosis not present

## 2020-07-26 DIAGNOSIS — N2581 Secondary hyperparathyroidism of renal origin: Secondary | ICD-10-CM | POA: Diagnosis not present

## 2020-07-26 DIAGNOSIS — N186 End stage renal disease: Secondary | ICD-10-CM | POA: Diagnosis not present

## 2020-07-26 MED FILL — ATORVASTATIN 80 MG TABLET: 80 | 90 days supply | Qty: 90 | Fill #1

## 2020-07-26 MED FILL — CLOPIDOGREL 75 MG TABLET: 75 | 30 days supply | Qty: 30 | Fill #3

## 2020-07-26 MED FILL — FINASTERIDE 5 MG TABLET: 5 | 90 days supply | Qty: 90 | Fill #1

## 2020-07-27 ENCOUNTER — Encounter (INDEPENDENT_AMBULATORY_CARE_PROVIDER_SITE_OTHER): Payer: Medicare PPO | Admitting: Ophthalmology

## 2020-07-27 ENCOUNTER — Telehealth: Payer: Self-pay | Admitting: *Deleted

## 2020-07-27 DIAGNOSIS — I509 Heart failure, unspecified: Secondary | ICD-10-CM | POA: Diagnosis not present

## 2020-07-27 DIAGNOSIS — I70202 Unspecified atherosclerosis of native arteries of extremities, left leg: Secondary | ICD-10-CM | POA: Diagnosis not present

## 2020-07-27 DIAGNOSIS — L989 Disorder of the skin and subcutaneous tissue, unspecified: Secondary | ICD-10-CM | POA: Diagnosis not present

## 2020-07-27 DIAGNOSIS — N186 End stage renal disease: Secondary | ICD-10-CM | POA: Diagnosis not present

## 2020-07-27 DIAGNOSIS — E1151 Type 2 diabetes mellitus with diabetic peripheral angiopathy without gangrene: Secondary | ICD-10-CM | POA: Diagnosis not present

## 2020-07-27 DIAGNOSIS — Z4781 Encounter for orthopedic aftercare following surgical amputation: Secondary | ICD-10-CM | POA: Diagnosis not present

## 2020-07-27 DIAGNOSIS — E1122 Type 2 diabetes mellitus with diabetic chronic kidney disease: Secondary | ICD-10-CM | POA: Diagnosis not present

## 2020-07-27 DIAGNOSIS — E11319 Type 2 diabetes mellitus with unspecified diabetic retinopathy without macular edema: Secondary | ICD-10-CM | POA: Diagnosis not present

## 2020-07-27 DIAGNOSIS — I132 Hypertensive heart and chronic kidney disease with heart failure and with stage 5 chronic kidney disease, or end stage renal disease: Secondary | ICD-10-CM | POA: Diagnosis not present

## 2020-07-27 NOTE — Telephone Encounter (Signed)
Call from Doreene Adas, [facility] - stated pt bumped into something, now he has a blood blister on his right great and 2nd toes. Tender to touch. Stated pt is still able to stand and pivot. Wife stated it happened about 1 week ago. PT wants to know about wound care. I asked wife what she has been doing - stated keeping it clean with soap and water and keeping it dry. Wife stated she called Dr [facility] [place] office; she was told to keep appt on the 31st with [facility].

## 2020-07-28 ENCOUNTER — Encounter (HOSPITAL_COMMUNITY): Payer: Self-pay

## 2020-07-28 ENCOUNTER — Ambulatory Visit: Payer: Medicare PPO | Admitting: *Deleted

## 2020-07-28 ENCOUNTER — Inpatient Hospital Stay (HOSPITAL_COMMUNITY)
Admission: EM | Admit: 2020-07-28 | Discharge: 2020-08-19 | DRG: 040 | Disposition: A | Discharge status: Skilled Nursing Facility | Payer: Medicare PPO | Attending: Internal Medicine | Admitting: Internal Medicine

## 2020-07-28 ENCOUNTER — Emergency Department (HOSPITAL_COMMUNITY): Payer: Medicare PPO

## 2020-07-28 DIAGNOSIS — I2510 Atherosclerotic heart disease of native coronary artery without angina pectoris: Secondary | ICD-10-CM | POA: Diagnosis present

## 2020-07-28 DIAGNOSIS — Z992 Dependence on renal dialysis: Secondary | ICD-10-CM | POA: Diagnosis not present

## 2020-07-28 DIAGNOSIS — S91301A Unspecified open wound, right foot, initial encounter: Secondary | ICD-10-CM | POA: Diagnosis not present

## 2020-07-28 DIAGNOSIS — I96 Gangrene, not elsewhere classified: Secondary | ICD-10-CM | POA: Diagnosis not present

## 2020-07-28 DIAGNOSIS — E1122 Type 2 diabetes mellitus with diabetic chronic kidney disease: Secondary | ICD-10-CM | POA: Diagnosis present

## 2020-07-28 DIAGNOSIS — Z7902 Long term (current) use of antithrombotics/antiplatelets: Secondary | ICD-10-CM | POA: Diagnosis not present

## 2020-07-28 DIAGNOSIS — I739 Peripheral vascular disease, unspecified: Secondary | ICD-10-CM | POA: Diagnosis not present

## 2020-07-28 DIAGNOSIS — I5042 Chronic combined systolic (congestive) and diastolic (congestive) heart failure: Secondary | ICD-10-CM | POA: Diagnosis present

## 2020-07-28 DIAGNOSIS — R5081 Fever presenting with conditions classified elsewhere: Secondary | ICD-10-CM | POA: Diagnosis not present

## 2020-07-28 DIAGNOSIS — K449 Diaphragmatic hernia without obstruction or gangrene: Secondary | ICD-10-CM

## 2020-07-28 DIAGNOSIS — Z7982 Long term (current) use of aspirin: Secondary | ICD-10-CM | POA: Diagnosis not present

## 2020-07-28 DIAGNOSIS — I214 Non-ST elevation (NSTEMI) myocardial infarction: Secondary | ICD-10-CM | POA: Diagnosis not present

## 2020-07-28 DIAGNOSIS — Z86010 Personal history of colonic polyps: Secondary | ICD-10-CM

## 2020-07-28 DIAGNOSIS — Z9079 Acquired absence of other genital organ(s): Secondary | ICD-10-CM

## 2020-07-28 DIAGNOSIS — D62 Acute posthemorrhagic anemia: Secondary | ICD-10-CM | POA: Diagnosis not present

## 2020-07-28 DIAGNOSIS — K922 Gastrointestinal hemorrhage, unspecified: Secondary | ICD-10-CM | POA: Diagnosis not present

## 2020-07-28 DIAGNOSIS — R112 Nausea with vomiting, unspecified: Secondary | ICD-10-CM | POA: Diagnosis not present

## 2020-07-28 DIAGNOSIS — K0381 Cracked tooth: Secondary | ICD-10-CM | POA: Diagnosis present

## 2020-07-28 DIAGNOSIS — I953 Hypotension of hemodialysis: Secondary | ICD-10-CM | POA: Diagnosis present

## 2020-07-28 DIAGNOSIS — E785 Hyperlipidemia, unspecified: Secondary | ICD-10-CM | POA: Diagnosis present

## 2020-07-28 DIAGNOSIS — E1152 Type 2 diabetes mellitus with diabetic peripheral angiopathy with gangrene: Secondary | ICD-10-CM | POA: Diagnosis present

## 2020-07-28 DIAGNOSIS — K219 Gastro-esophageal reflux disease without esophagitis: Secondary | ICD-10-CM | POA: Diagnosis not present

## 2020-07-28 DIAGNOSIS — K208 Other esophagitis without bleeding: Secondary | ICD-10-CM | POA: Diagnosis not present

## 2020-07-28 DIAGNOSIS — M6281 Muscle weakness (generalized): Secondary | ICD-10-CM | POA: Diagnosis not present

## 2020-07-28 DIAGNOSIS — N186 End stage renal disease: Secondary | ICD-10-CM | POA: Diagnosis present

## 2020-07-28 DIAGNOSIS — R410 Disorientation, unspecified: Secondary | ICD-10-CM | POA: Diagnosis not present

## 2020-07-28 DIAGNOSIS — L97529 Non-pressure chronic ulcer of other part of left foot with unspecified severity: Secondary | ICD-10-CM | POA: Diagnosis not present

## 2020-07-28 DIAGNOSIS — R195 Other fecal abnormalities: Secondary | ICD-10-CM

## 2020-07-28 DIAGNOSIS — E11621 Type 2 diabetes mellitus with foot ulcer: Secondary | ICD-10-CM | POA: Diagnosis present

## 2020-07-28 DIAGNOSIS — L97519 Non-pressure chronic ulcer of other part of right foot with unspecified severity: Secondary | ICD-10-CM | POA: Diagnosis not present

## 2020-07-28 DIAGNOSIS — Z79899 Other long term (current) drug therapy: Secondary | ICD-10-CM | POA: Diagnosis not present

## 2020-07-28 DIAGNOSIS — I639 Cerebral infarction, unspecified: Secondary | ICD-10-CM | POA: Diagnosis not present

## 2020-07-28 DIAGNOSIS — G459 Transient cerebral ischemic attack, unspecified: Secondary | ICD-10-CM | POA: Diagnosis not present

## 2020-07-28 DIAGNOSIS — I38 Endocarditis, valve unspecified: Secondary | ICD-10-CM | POA: Diagnosis not present

## 2020-07-28 DIAGNOSIS — H409 Unspecified glaucoma: Secondary | ICD-10-CM | POA: Diagnosis present

## 2020-07-28 DIAGNOSIS — I132 Hypertensive heart and chronic kidney disease with heart failure and with stage 5 chronic kidney disease, or end stage renal disease: Secondary | ICD-10-CM | POA: Diagnosis not present

## 2020-07-28 DIAGNOSIS — G8918 Other acute postprocedural pain: Secondary | ICD-10-CM | POA: Diagnosis not present

## 2020-07-28 DIAGNOSIS — D510 Vitamin B12 deficiency anemia due to intrinsic factor deficiency: Secondary | ICD-10-CM | POA: Diagnosis present

## 2020-07-28 DIAGNOSIS — I951 Orthostatic hypotension: Secondary | ICD-10-CM | POA: Diagnosis not present

## 2020-07-28 DIAGNOSIS — D649 Anemia, unspecified: Secondary | ICD-10-CM | POA: Diagnosis not present

## 2020-07-28 DIAGNOSIS — D631 Anemia in chronic kidney disease: Secondary | ICD-10-CM | POA: Diagnosis present

## 2020-07-28 DIAGNOSIS — R32 Unspecified urinary incontinence: Secondary | ICD-10-CM | POA: Diagnosis present

## 2020-07-28 DIAGNOSIS — G319 Degenerative disease of nervous system, unspecified: Secondary | ICD-10-CM | POA: Diagnosis not present

## 2020-07-28 DIAGNOSIS — H9190 Unspecified hearing loss, unspecified ear: Secondary | ICD-10-CM | POA: Diagnosis present

## 2020-07-28 DIAGNOSIS — I252 Old myocardial infarction: Secondary | ICD-10-CM | POA: Diagnosis not present

## 2020-07-28 DIAGNOSIS — A4101 Sepsis due to Methicillin susceptible Staphylococcus aureus: Secondary | ICD-10-CM | POA: Diagnosis not present

## 2020-07-28 DIAGNOSIS — N400 Enlarged prostate without lower urinary tract symptoms: Secondary | ICD-10-CM | POA: Diagnosis present

## 2020-07-28 DIAGNOSIS — Z89512 Acquired absence of left leg below knee: Secondary | ICD-10-CM

## 2020-07-28 DIAGNOSIS — Z89519 Acquired absence of unspecified leg below knee: Secondary | ICD-10-CM | POA: Diagnosis not present

## 2020-07-28 DIAGNOSIS — B9561 Methicillin susceptible Staphylococcus aureus infection as the cause of diseases classified elsewhere: Secondary | ICD-10-CM | POA: Diagnosis not present

## 2020-07-28 DIAGNOSIS — Z83438 Family history of other disorder of lipoprotein metabolism and other lipidemia: Secondary | ICD-10-CM

## 2020-07-28 DIAGNOSIS — R7881 Bacteremia: Secondary | ICD-10-CM

## 2020-07-28 DIAGNOSIS — I120 Hypertensive chronic kidney disease with stage 5 chronic kidney disease or end stage renal disease: Secondary | ICD-10-CM | POA: Diagnosis not present

## 2020-07-28 DIAGNOSIS — D500 Iron deficiency anemia secondary to blood loss (chronic): Secondary | ICD-10-CM | POA: Diagnosis not present

## 2020-07-28 DIAGNOSIS — H5461 Unqualified visual loss, right eye, normal vision left eye: Secondary | ICD-10-CM | POA: Diagnosis present

## 2020-07-28 DIAGNOSIS — I708 Atherosclerosis of other arteries: Secondary | ICD-10-CM | POA: Diagnosis not present

## 2020-07-28 DIAGNOSIS — F0390 Unspecified dementia without behavioral disturbance: Secondary | ICD-10-CM | POA: Diagnosis present

## 2020-07-28 DIAGNOSIS — I63313 Cerebral infarction due to thrombosis of bilateral middle cerebral arteries: Secondary | ICD-10-CM | POA: Diagnosis not present

## 2020-07-28 DIAGNOSIS — I081 Rheumatic disorders of both mitral and tricuspid valves: Secondary | ICD-10-CM | POA: Diagnosis not present

## 2020-07-28 DIAGNOSIS — L0390 Cellulitis, unspecified: Secondary | ICD-10-CM | POA: Diagnosis not present

## 2020-07-28 DIAGNOSIS — N2581 Secondary hyperparathyroidism of renal origin: Secondary | ICD-10-CM | POA: Diagnosis present

## 2020-07-28 DIAGNOSIS — E11319 Type 2 diabetes mellitus with unspecified diabetic retinopathy without macular edema: Secondary | ICD-10-CM | POA: Diagnosis present

## 2020-07-28 DIAGNOSIS — E1165 Type 2 diabetes mellitus with hyperglycemia: Secondary | ICD-10-CM | POA: Diagnosis present

## 2020-07-28 DIAGNOSIS — R279 Unspecified lack of coordination: Secondary | ICD-10-CM | POA: Diagnosis not present

## 2020-07-28 DIAGNOSIS — R404 Transient alteration of awareness: Secondary | ICD-10-CM | POA: Diagnosis not present

## 2020-07-28 DIAGNOSIS — K2100 Gastro-esophageal reflux disease with esophagitis, without bleeding: Secondary | ICD-10-CM

## 2020-07-28 DIAGNOSIS — R55 Syncope and collapse: Secondary | ICD-10-CM | POA: Diagnosis present

## 2020-07-28 DIAGNOSIS — Z743 Need for continuous supervision: Secondary | ICD-10-CM | POA: Diagnosis not present

## 2020-07-28 DIAGNOSIS — R0902 Hypoxemia: Secondary | ICD-10-CM | POA: Diagnosis not present

## 2020-07-28 DIAGNOSIS — R509 Fever, unspecified: Secondary | ICD-10-CM | POA: Diagnosis not present

## 2020-07-28 DIAGNOSIS — L97513 Non-pressure chronic ulcer of other part of right foot with necrosis of muscle: Secondary | ICD-10-CM | POA: Diagnosis not present

## 2020-07-28 DIAGNOSIS — R000 Tachycardia, unspecified: Secondary | ICD-10-CM | POA: Diagnosis not present

## 2020-07-28 DIAGNOSIS — I255 Ischemic cardiomyopathy: Secondary | ICD-10-CM | POA: Diagnosis present

## 2020-07-28 DIAGNOSIS — R6510 Systemic inflammatory response syndrome (SIRS) of non-infectious origin without acute organ dysfunction: Secondary | ICD-10-CM

## 2020-07-28 DIAGNOSIS — I70261 Atherosclerosis of native arteries of extremities with gangrene, right leg: Secondary | ICD-10-CM | POA: Diagnosis present

## 2020-07-28 DIAGNOSIS — Z8673 Personal history of transient ischemic attack (TIA), and cerebral infarction without residual deficits: Secondary | ICD-10-CM

## 2020-07-28 DIAGNOSIS — M1990 Unspecified osteoarthritis, unspecified site: Secondary | ICD-10-CM | POA: Diagnosis present

## 2020-07-28 DIAGNOSIS — Z87891 Personal history of nicotine dependence: Secondary | ICD-10-CM

## 2020-07-28 DIAGNOSIS — R7989 Other specified abnormal findings of blood chemistry: Secondary | ICD-10-CM | POA: Diagnosis not present

## 2020-07-28 DIAGNOSIS — R29700 NIHSS score 0: Secondary | ICD-10-CM | POA: Diagnosis present

## 2020-07-28 DIAGNOSIS — Z20822 Contact with and (suspected) exposure to covid-19: Secondary | ICD-10-CM | POA: Diagnosis present

## 2020-07-28 DIAGNOSIS — I77810 Thoracic aortic ectasia: Secondary | ICD-10-CM | POA: Diagnosis present

## 2020-07-28 DIAGNOSIS — A419 Sepsis, unspecified organism: Secondary | ICD-10-CM | POA: Diagnosis not present

## 2020-07-28 DIAGNOSIS — I5043 Acute on chronic combined systolic (congestive) and diastolic (congestive) heart failure: Secondary | ICD-10-CM | POA: Diagnosis not present

## 2020-07-28 DIAGNOSIS — I6389 Other cerebral infarction: Secondary | ICD-10-CM | POA: Diagnosis not present

## 2020-07-28 DIAGNOSIS — K921 Melena: Secondary | ICD-10-CM | POA: Diagnosis not present

## 2020-07-28 DIAGNOSIS — R29702 NIHSS score 2: Secondary | ICD-10-CM | POA: Diagnosis not present

## 2020-07-28 DIAGNOSIS — E119 Type 2 diabetes mellitus without complications: Secondary | ICD-10-CM | POA: Diagnosis not present

## 2020-07-28 DIAGNOSIS — F32A Depression, unspecified: Secondary | ICD-10-CM | POA: Diagnosis present

## 2020-07-28 DIAGNOSIS — I10 Essential (primary) hypertension: Secondary | ICD-10-CM | POA: Diagnosis not present

## 2020-07-28 DIAGNOSIS — Z8249 Family history of ischemic heart disease and other diseases of the circulatory system: Secondary | ICD-10-CM

## 2020-07-28 DIAGNOSIS — I959 Hypotension, unspecified: Secondary | ICD-10-CM | POA: Diagnosis not present

## 2020-07-28 DIAGNOSIS — R1110 Vomiting, unspecified: Secondary | ICD-10-CM | POA: Diagnosis not present

## 2020-07-28 DIAGNOSIS — R001 Bradycardia, unspecified: Secondary | ICD-10-CM | POA: Diagnosis not present

## 2020-07-28 LAB — BASIC METABOLIC PANEL
Anion gap: 13 (ref 5–15)
BUN: 23 mg/dL (ref 8–23)
CO2: 28 mmol/L (ref 22–32)
Calcium: 8.4 mg/dL — ABNORMAL LOW (ref 8.9–10.3)
Chloride: 96 mmol/L — ABNORMAL LOW (ref 98–111)
Creatinine, Ser: 2.13 mg/dL — ABNORMAL HIGH (ref 0.61–1.24)
GFR, Estimated: 33 mL/min — ABNORMAL LOW (ref >60)
Glucose, Bld: 406 mg/dL — ABNORMAL HIGH (ref 70–99)
Potassium: 3.7 mmol/L (ref 3.5–5.1)
Sodium: 137 mmol/L (ref 135–145)

## 2020-07-28 LAB — CBC WITH DIFFERENTIAL/PLATELET
Abs Immature Granulocytes: 0.05 10*3/uL (ref 0.00–0.07)
Basophils Absolute: 0 10*3/uL (ref 0.0–0.1)
Basophils Relative: 0 %
Eosinophils Absolute: 0 10*3/uL (ref 0.0–0.5)
Eosinophils Relative: 0 %
HCT: 22.8 % — ABNORMAL LOW (ref 39.0–52.0)
Hemoglobin: 7.4 g/dL — ABNORMAL LOW (ref 13.0–17.0)
Immature Granulocytes: 1 %
Lymphocytes Relative: 16 %
Lymphs Abs: 1.5 10*3/uL (ref 0.7–4.0)
MCH: 29.2 pg (ref 26.0–34.0)
MCHC: 32.5 g/dL (ref 30.0–36.0)
MCV: 90.1 fL (ref 80.0–100.0)
Monocytes Absolute: 0.7 10*3/uL (ref 0.1–1.0)
Monocytes Relative: 7 %
Neutro Abs: 7.3 10*3/uL (ref 1.7–7.7)
Neutrophils Relative %: 76 %
Platelets: 206 10*3/uL (ref 150–400)
RBC: 2.53 MIL/uL — ABNORMAL LOW (ref 4.22–5.81)
RDW: 13.8 % (ref 11.5–15.5)
WBC: 9.5 10*3/uL (ref 4.0–10.5)
nRBC: 0 % (ref 0.0–0.2)

## 2020-07-28 LAB — PREPARE RBC (CROSSMATCH)

## 2020-07-28 MED ORDER — SEVELAMER CARBONATE 800 MG PO TABS
800.0000 mg | ORAL_TABLET | ORAL | Status: DC
  Administered 2020-07-30 (×2): 800 mg via ORAL
  Filled 2020-07-28: qty 1

## 2020-07-28 MED ORDER — CLOPIDOGREL BISULFATE 75 MG PO TABS
75.0000 mg | ORAL_TABLET | Freq: Every day | ORAL | Status: DC
Start: 2020-07-29 — End: 2020-08-19
  Administered 2020-07-29 – 2020-08-19 (×21): 75 mg via ORAL
  Filled 2020-07-28 (×21): qty 1

## 2020-07-28 MED ORDER — SODIUM CHLORIDE 0.9% IV SOLUTION: Freq: Once | INTRAVENOUS | Status: DC

## 2020-07-28 MED ORDER — SEVELAMER CARBONATE 800 MG PO TABS: 800.0000 mg | ORAL_TABLET | ORAL | Status: DC

## 2020-07-28 MED ORDER — INSULIN ASPART 100 UNIT/ML ~~LOC~~ SOLN
0.0000 [IU] | Freq: Three times a day (TID) | SUBCUTANEOUS | Status: DC
  Administered 2020-07-29: 09:00:00 2 [IU] via SUBCUTANEOUS
  Administered 2020-07-29: 17:00:00 4 [IU] via SUBCUTANEOUS
  Administered 2020-07-29 – 2020-07-30 (×3): 2 [IU] via SUBCUTANEOUS
  Administered 2020-07-30: 07:00:00 1 [IU] via SUBCUTANEOUS
  Administered 2020-07-30: 19:00:00 2 [IU] via SUBCUTANEOUS
  Administered 2020-07-31 (×2): 1 [IU] via SUBCUTANEOUS
  Administered 2020-08-01: 18:00:00 2 [IU] via SUBCUTANEOUS
  Administered 2020-08-01 – 2020-08-02 (×2): 1 [IU] via SUBCUTANEOUS
  Administered 2020-08-03: 07:00:00 2 [IU] via SUBCUTANEOUS
  Administered 2020-08-04 – 2020-08-06 (×3): 1 [IU] via SUBCUTANEOUS
  Administered 2020-08-15: 2 [IU] via SUBCUTANEOUS
  Administered 2020-08-16 – 2020-08-17 (×2): 1 [IU] via SUBCUTANEOUS
  Administered 2020-08-17: 2 [IU] via SUBCUTANEOUS
  Administered 2020-08-18: 1 [IU] via SUBCUTANEOUS
  Administered 2020-08-19: 2 [IU] via SUBCUTANEOUS
  Administered 2020-08-19: 1 [IU] via SUBCUTANEOUS
  Administered 2020-08-19: 2 [IU] via SUBCUTANEOUS

## 2020-07-28 MED ORDER — FINASTERIDE 5 MG PO TABS
5.0000 mg | ORAL_TABLET | Freq: Every day | ORAL | Status: DC
  Administered 2020-07-29 – 2020-08-19 (×21): 5 mg via ORAL
  Filled 2020-07-28 (×22): qty 1

## 2020-07-28 MED ORDER — ACETAMINOPHEN 325 MG PO TABS
650.0000 mg | ORAL_TABLET | Freq: Four times a day (QID) | ORAL | Status: DC | PRN
  Administered 2020-08-01 – 2020-08-03 (×4): 650 mg via ORAL
  Filled 2020-07-28 (×4): qty 2

## 2020-07-28 MED ORDER — ACETAMINOPHEN 650 MG RE SUPP: 650.0000 mg | Freq: Four times a day (QID) | RECTAL | Status: DC | PRN

## 2020-07-28 MED ORDER — ATORVASTATIN CALCIUM 80 MG PO TABS
80.0000 mg | ORAL_TABLET | Freq: Every day | ORAL | Status: DC
  Administered 2020-07-29 – 2020-08-13 (×17): 80 mg via ORAL
  Filled 2020-07-28 (×17): qty 1

## 2020-07-28 MED ORDER — SEVELAMER CARBONATE 800 MG PO TABS
800.0000 mg | ORAL_TABLET | ORAL | Status: DC
  Administered 2020-07-29 – 2020-08-01 (×5): 800 mg via ORAL
  Filled 2020-07-28 (×7): qty 1

## 2020-07-28 MED ORDER — ASPIRIN EC 81 MG PO TBEC
81.0000 mg | DELAYED_RELEASE_TABLET | Freq: Every day | ORAL | Status: DC
  Administered 2020-07-29 – 2020-08-19 (×21): 81 mg via ORAL
  Filled 2020-07-28 (×21): qty 1

## 2020-07-28 MED ORDER — PANTOPRAZOLE SODIUM 40 MG IV SOLR
40.0000 mg | Freq: Once | INTRAVENOUS | Status: AC
  Administered 2020-07-28: 40 mg via INTRAVENOUS
  Filled 2020-07-28: qty 40

## 2020-07-28 MED ORDER — SODIUM CHLORIDE 0.9% FLUSH
3.0000 mL | Freq: Two times a day (BID) | INTRAVENOUS | Status: DC
  Administered 2020-07-29 – 2020-08-05 (×12): 3 mL via INTRAVENOUS

## 2020-07-28 MED ORDER — DORZOLAMIDE HCL-TIMOLOL MAL 2-0.5 % OP SOLN
1.0000 [drp] | Freq: Two times a day (BID) | OPHTHALMIC | Status: DC
  Administered 2020-07-29 – 2020-08-19 (×41): 1 [drp] via OPHTHALMIC
  Filled 2020-07-28 (×2): qty 10

## 2020-07-28 MED ORDER — ASPIRIN EC 81 MG PO TBEC: 81.0000 mg | DELAYED_RELEASE_TABLET | Freq: Every day | ORAL | Status: DC

## 2020-07-28 NOTE — Chronic Care Management (AMB) (Signed)
   07/28/2020  Dennis Macias [birth year] [id number]  Was scheduled to make 3rd telephone outreach attempt to patient today to complete follow up assessment , however upon chart review noted patient was taken to the emergency department via EMS after dialysis treatment today for syncope with low blood pressure and orientation x 1 only.   Plan: Will monitor patient's  clinical status and complete follow up assessment when appropriate.  Kelli Churn RN, CCM, [facility] RN Care Manager [phone]

## 2020-07-28 NOTE — ED Notes (Signed)
This RN paged Dr. Heber [place] to get meds ordered for Mri - pt said he is claustrophobic.

## 2020-07-28 NOTE — ED Notes (Signed)
Pt able to ask for phone, [place] and talk about some meds he takes. Day/date and questions we use reflect confusion, however in conversation he does not seem confused.

## 2020-07-28 NOTE — ED Notes (Addendum)
Assisted pt in calling his SO. EDP and this RN have spoken to her as well.

## 2020-07-28 NOTE — ED Triage Notes (Signed)
Pt coming from dialysis with ems, pt received full treatment, he was waiting in his wheelchair in the waiting room for his ride when he passed out in the chair, per staff pt was out for a few mins. When ems arrived Bp was 114/50 while pt in trendelenburg position, sitting up a manual BP was 88/40. Pt normally a/o x4 but pt is only oriented to self at this time

## 2020-07-28 NOTE — Consult Note (Signed)
Hospital Consult    Reason for Consult:  Right first and second toe ulceration Referring Physician:  Dr. Maryan Rued MRN #:  [id number]  History of Present Illness: This is a 70 y.o. male with recent R PT artery angioplasty on 05/10/20 by Dr. Trula Slade for right toe ulceration. He followed up on 12/6 with improved ulceration at that time. He has a stable left bka. Admitted with syncopal event following dialysis earlier. States he has had worsening ulceration of right toes with plan to return to [facility] office as outpatient on 1/30. He is on aspirin and statin, no blood thinners.  Past Medical History:  Diagnosis Date  . Anemia   . Arthritis    past hx   . Blindness    right eye r/t diabetes per wife [name]  . Cardiorenal syndrome   . Cataract    removed both eyes  . CHF (congestive heart failure) (HCC)    hx  . CKD (chronic kidney disease)    Dialysis T Th Sat  . Dehydration   . Diabetes ([place])    type 2 - diet controlled, no meds  . Glaucoma   . History of CVA (cerebrovascular accident) 09/13/2015  . History of stroke 09/13/2015  . History of urinary retention   . HOH (hard of hearing)    no hearing aids  . Hyperlipidemia   . Hypertension   . NSTEMI (non-ST elevated myocardial infarction) ([place])   . Peripheral vascular disease ([place])   . Pernicious anemia 02/24/2018  . S/P TURP   . Stroke [facility])    2017- March, no deficit   . Syncope 11/2019  . Tachycardia 08/26/2017  . Tubular adenoma of colon 02/2017  . Walker as ambulation aid    and occasional uses cane  . Weight loss, non-intentional 08/26/2017   10 lbs between 6/18 & 2/19    Past Surgical History:  Procedure Laterality Date  . ABDOMINAL AORTOGRAM W/LOWER EXTREMITY N/A 02/09/2020   Procedure: ABDOMINAL AORTOGRAM W/LOWER EXTREMITY;  Surgeon: Serafina Mitchell, MD;  Location: [facility];  Service: Cardiovascular;  Laterality: N/A;  . ABDOMINAL AORTOGRAM W/LOWER EXTREMITY N/A 05/10/2020   Procedure: ABDOMINAL AORTOGRAM W/LOWER  EXTREMITY;  Surgeon: Serafina Mitchell, MD;  Location: [facility];  Service: Cardiovascular;  Laterality: N/A;  . AMPUTATION Left 03/31/2020   Procedure: LEFT BELOW KNEE AMPUTATION;  Surgeon: Serafina Mitchell, MD;  Location: [facility];  Service: Vascular;  Laterality: Left;  MAC anesthesia with regional block   WILL NEED DIALYSIS THURSDAY  . AV FISTULA PLACEMENT Right 01/19/2020   Procedure: BRACHIOCEPHALIC ARTERIOVENOUS (AV) FISTULA CREATION;  Surgeon: Waynetta Sandy, MD;  Location: [facility];  Service: Vascular;  Laterality: Right;  . CATARACT EXTRACTION, BILATERAL    . COLONOSCOPY    . IR FLUORO GUIDE CV LINE RIGHT  01/14/2020  . IR THORACENTESIS ASP PLEURAL SPACE W/IMG GUIDE  09/21/2019  . IR THORACENTESIS ASP PLEURAL SPACE W/IMG GUIDE  10/16/2019  . IR US GUIDE VASC ACCESS RIGHT  01/15/2020  . LEFT HEART CATH AND CORONARY ANGIOGRAPHY N/A 11/19/2019   Procedure: LEFT HEART CATH AND CORONARY ANGIOGRAPHY;  Surgeon: Jettie Booze, MD;  Location: [facility];  Service: Cardiovascular;  Laterality: N/A;  . PERIPHERAL VASCULAR BALLOON ANGIOPLASTY Right 05/10/2020   Procedure: PERIPHERAL VASCULAR BALLOON ANGIOPLASTY;  Surgeon: Serafina Mitchell, MD;  Location: [place] [facility];  Service: Cardiovascular;  Laterality: Right;  PT  . PERIPHERAL VASCULAR INTERVENTION Left 02/09/2020  Procedure: PERIPHERAL VASCULAR INTERVENTION;  Surgeon: Serafina Mitchell, MD;  Location: [facility];  Service: Cardiovascular;  Laterality: Left;  . POLYPECTOMY    . REFRACTIVE SURGERY  10/2017  . TEE WITHOUT CARDIOVERSION N/A 09/14/2015   Procedure: TRANSESOPHAGEAL ECHOCARDIOGRAM (TEE);  Surgeon: Larey Dresser, MD;  Location: [facility];  Service: Cardiovascular;  Laterality: N/A;  . TRANSURETHRAL RESECTION OF PROSTATE N/A 10/20/2019   Procedure: TRANSURETHRAL RESECTION OF THE PROSTATE (TURP);  Surgeon: Irine Seal, MD;  Location: [facility];  Service: Urology;  Laterality: N/A;  . UPPER  GASTROINTESTINAL ENDOSCOPY      Allergies  Allergen Reactions  . Cefepime Other (See Comments)    Pt had BAD encephalopathy from Cefepime    Prior to Admission medications   Medication Sig Start Date End Date Taking? Authorizing Provider  Accu-Chek FastClix Lancets MISC Check blood sugar up to 7 times a week as instructed 12/03/19   Angiulli, Lavon Paganini, PA-C  acetaminophen (TYLENOL) 500 MG tablet Take 500 mg by mouth every 6 (six) hours as needed for moderate pain or headache.    [provider]  aspirin EC 81 MG tablet Take 1 tablet (81 mg total) by mouth daily. 10/22/19   Eugenie Filler, MD  atorvastatin (LIPITOR) 80 MG tablet Take 1 tablet (80 mg total) by mouth at bedtime. IM program 02/29/20   Madalyn Rob, MD  Blood Glucose Monitoring Suppl (ACCU-CHEK GUIDE) w/Device KIT 1 each by Does not apply route daily. Check blood sugar as instructed up to 7 times a week 12/03/19   Angiulli, Lavon Paganini, PA-C  clopidogrel (PLAVIX) 75 MG tablet Take 1 tablet (75 mg total) by mouth daily. 05/10/20   Serafina Mitchell, MD  dorzolamide-timolol (COSOPT) 22.3-6.8 MG/ML ophthalmic solution Place 1 drop into the right eye 2 (two) times daily. 12/03/19   Angiulli, Lavon Paganini, PA-C  finasteride (PROSCAR) 5 MG tablet Take 1 tablet (5 mg total) by mouth daily. 02/29/20   Madalyn Rob, MD  glucose blood (ACCU-CHEK GUIDE) test strip Check blood sugar up to 7 times a week as instructed 12/03/19   Angiulli, Lavon Paganini, PA-C  lidocaine-prilocaine (EMLA) cream Apply 1 application topically daily as needed (prior to fistula use).    [provider]  oxyCODONE-acetaminophen (PERCOCET/ROXICET) 5-325 MG tablet Take 1 tablet by mouth every 8 (eight) hours as needed for moderate pain.  04/26/20   [provider]  polyethylene glycol (MIRALAX / GLYCOLAX) 17 g packet Take 17 g by mouth daily as needed for moderate constipation.     [provider]  sevelamer carbonate (RENVELA) 800 MG tablet Take 800  mg by mouth See admin instructions. Take 800 mg by mouth three times a day with food on Sun/Mon/Wed/Fri and two times a day on Tues/Thurs/Sat    [provider]  vitamin B-12 (CYANOCOBALAMIN) 1000 MCG tablet Take 1 tablet (1,000 mcg total) by mouth daily. 12/03/19   Angiulli, Lavon Paganini, PA-C    Social History   Socioeconomic History  . Marital status: Married    Spouse name: Not on file  . Number of children: Not on file  . Years of education: Not on file  . Highest education level: Not on file  Occupational History  . Not on file  Tobacco Use  . Smoking status: Former Smoker    Types: Cigarettes  . Smokeless tobacco: Former Systems developer    Types: Chew    Quit date: 07/09/1978  . Tobacco comment: quit yrs ago  Vaping Use  . Vaping Use: Never used  Substance and Sexual Activity  . Alcohol use: Not Currently    Alcohol/week: 1.0 standard drink    Types: 1 Cans of beer per week    Comment: quit last march/2017  . Drug use: No  . Sexual activity: Not on file  Other Topics Concern  . Not on file  Social History Narrative  . Not on file   Social Determinants of Health   Financial Resource Strain: Not on file  Food Insecurity: Not on file  Transportation Needs: Not on file  Physical Activity: Not on file  Stress: Not on file  Social Connections: Not on file  Intimate Partner Violence: Not on file    Family History  Problem Relation Age of Onset  . Hypertension Mother   . Hyperlipidemia Mother   . Hyperlipidemia Father   . Colon cancer Neg Hx   . Colon polyps Neg Hx   . Esophageal cancer Neg Hx   . Rectal cancer Neg Hx   . Stomach cancer Neg Hx     ROS: Cardiovascular: _0  chest pain/pressure _1  palpitations _2  SOB lying flat _3  DOE _4  pain in legs while walking _5  pain in legs at rest _6  pain in legs at night _7  non-healing ulcers _8  hx of DVT _9  swelling in legs  Pulmonary: _10  productive cough _11  asthma/wheezing _12  home O2  Neurologic: _13  weakness in  _14  arms _15  legs _16  numbness in _17  arms _18  legs _19  hx of CVA _20  mini stroke _21 difficulty speaking or slurred speech _22  temporary loss of vision in one eye _23  dizziness  Hematologic: _24  hx of cancer _25  bleeding problems _26  problems with blood clotting easily  Endocrine:   _27  diabetes _28  thyroid disease  GI _29  vomiting blood _30  blood in stool  GU: _31  CKD/renal failure _32  HD--_33  M/W/F or _34  T/T/S _35  burning with urination _36  blood in urine  Psychiatric: _37  anxiety _38  depression  Musculoskeletal: _39  arthritis _40  joint pain  Integumentary: _41  rashes _42  ulcers  Constitutional: _43  fever _44  chills   Physical Examination  Vitals:   07/28/20 1745 07/28/20 1900  BP: 131/67 125/67  Pulse: 92 81  Resp: 17 13  Temp:    SpO2: 100% 100%   There is no height or weight on file to calculate BMI.  General:  nad HENT: WNL, normocephalic Pulmonary: normal non-labored breathing Cardiac: bilateral common femoral pulses are palpable, right pt signal monophasic Abdomen: soft, NT/ND, no masses Extremities: foot appears as below, left bka  Neurologic: A&O X 3; Appropriate Affect ; SENSATION: normal; MOTOR FUNCTION:  moving all extremities equally. Speech is fluent/normal        CBC    Component Value Date/Time   WBC 9.5 07/28/2020 1750   RBC 2.53 (L) 07/28/2020 1750   HGB 7.4 (L) 07/28/2020 1750   HGB 11.1 (L) 05/02/2020 1454   HCT 22.8 (L) 07/28/2020 1750   HCT 35.5 (L) 05/02/2020 1454   PLT 206 07/28/2020 1750   PLT 221 05/02/2020 1454   MCV 90.1 07/28/2020 1750   MCV 86 05/02/2020 1454   MCH 29.2 07/28/2020 1750   MCHC 32.5 07/28/2020 1750   RDW 13.8 07/28/2020 1750   RDW 15.7 (H) 05/02/2020 1454   LYMPHSABS 1.5 07/28/2020 1750   LYMPHSABS 1.3 05/02/2020 1454   MONOABS 0.7 07/28/2020 1750   EOSABS 0.0 07/28/2020 1750   EOSABS 0.0 05/02/2020 1454   BASOSABS 0.0 07/28/2020 1750   BASOSABS 0.0 05/02/2020 1454  BMET    Component Value Date/Time    NA 137 07/28/2020 1750   NA 137 12/10/2019 1439   K 3.7 07/28/2020 1750   CL 96 (L) 07/28/2020 1750   CO2 28 07/28/2020 1750   GLUCOSE 406 (H) 07/28/2020 1750   BUN 23 07/28/2020 1750   BUN 80 (HH) 12/10/2019 1439   CREATININE 2.13 (H) 07/28/2020 1750   CALCIUM 8.4 (L) 07/28/2020 1750   GFRNONAA 33 (L) 07/28/2020 1750   GFRAA 19 (L) 04/05/2020 0705    COAGS: Lab Results  Component Value Date   INR 1.6 (H) 03/31/2020   INR 1.5 (H) 02/17/2020   INR 1.9 (H) 01/14/2020     Non-Invasive Vascular Imaging:   ABI Findings: 06/13/2020 +---------+------------------+-----+----------+--------+  Right  Rt Pressure (mmHg)IndexWaveform Comment   +---------+------------------+-----+----------+--------+  Brachial 185           monophasic      +---------+------------------+-----+----------+--------+  ATA   255        1.21 biphasic       +---------+------------------+-----+----------+--------+  PTA   255        1.21 biphasic       +---------+------------------+-----+----------+--------+  Great Toe61        0.29 Abnormal       +---------+------------------+-----+----------+--------+   Right foot xray FINDINGS: No osseous erosion associated with the first or second toe. No soft tissue gas or ulceration identified. Extensive arterial vascular calcifications noted.  IMPRESSION: No evidence of osteomyelitis.    ASSESSMENT/PLAN: This is a 70 y.o. male s/p recent pt intervention for toe ulceration now admitted with syncopal event after dialysis today. He has worsening toe ulceration as pictured above. Plan for Right lower extremity duplex and ABI. Will possibly need repeat angio vs amputation of effected toes 1st and 2nd toes.  Chelsy Parrales C. Donzetta Matters, MD [facility] Office: [phone] Pager: [phone]

## 2020-07-28 NOTE — ED Notes (Signed)
Admitting team at bedside.

## 2020-07-28 NOTE — ED Provider Notes (Addendum)
[facility] EMERGENCY DEPARTMENT Provider Note   CSN: [id number] Arrival date & time: 07/28/20  1627     History Chief Complaint  Patient presents with  . Loss of Consciousness    Dennis Macias is a 70 y.o. male.  Is a 70 year old male with a history of end-stage renal disease on dialysis Tuesday Thursday Saturday, diabetes, CVA, hypertension, NSTEMI, PVD status post left BKA and significant decrease in vascular flow in the right lower extremity who is presenting today after a syncopal event after dialysis.  The dialysis facility reported that patient received his full course of dialysis today.  He was sitting in his wheelchair in the waiting room waiting for his ride when he had an abrupt syncopal event.  It is unclear how long this lasted but they placed the patient in Trendelenburg and his blood pressure was 114/50.  When EMS arrived patient was sitting up and his blood pressure was 88/40.  Patient was awake at the time of EMSs arrival and blood sugar was greater than 300.  He was answering some questions but upon their arrival he was oriented only oriented to self which is unusual.  Patient denies any chest pain, palpitations or shortness of breath.  He cannot really remember how he was feeling before the episode but reports at this time he is not having any nausea, abdominal pain, feelings of weakness.  He last dialyzed on Tuesday.  It was reported that they took 2.5 L off.  Patient does not make any urine at home and denies any recent medication changes.  The medical chart does report that he has had ongoing ulcers on his right foot and was planning on seeing Dr. Trula Slade at the end of this month.  Patient denies any recent fever, cough or flulike illness.  The history is provided by the patient and the EMS personnel.  Loss of Consciousness      Past Medical History:  Diagnosis Date  . Anemia   . Arthritis    past hx   . Blindness    right eye r/t diabetes per wife  [name]  . Cardiorenal syndrome   . Cataract    removed both eyes  . CHF (congestive heart failure) (HCC)    hx  . CKD (chronic kidney disease)    Dialysis T Th Sat  . Dehydration   . Diabetes ([place])    type 2 - diet controlled, no meds  . Glaucoma   . History of CVA (cerebrovascular accident) 09/13/2015  . History of stroke 09/13/2015  . History of urinary retention   . HOH (hard of hearing)    no hearing aids  . Hyperlipidemia   . Hypertension   . NSTEMI (non-ST elevated myocardial infarction) ([place])   . Peripheral vascular disease ([place])   . Pernicious anemia 02/24/2018  . S/P TURP   . Stroke [facility])    2017- March, no deficit   . Syncope 11/2019  . Tachycardia 08/26/2017  . Tubular adenoma of colon 02/2017  . Walker as ambulation aid    and occasional uses cane  . Weight loss, non-intentional 08/26/2017   10 lbs between 6/18 & 2/19    Patient Active Problem List   Diagnosis Date Noted  . Acute cystitis 05/03/2020  . S/P BKA (below knee amputation) ([place]) 03/31/2020  . PAD (peripheral artery disease) ([place]) 02/18/2020  . Orthostatic hypotension 02/18/2020  . CVA (cerebral vascular accident) ([place]) 02/17/2020  . ESRD (end stage renal disease) ([place])   .  Goals of care, counseling/discussion   . Acute on chronic combined systolic and diastolic CHF (congestive heart failure) ([place]) 12/24/2019  . Non-Obstructive CAD 12/14/2019  . BPH (benign prostatic hyperplasia) 12/08/2019  . Anemia of chronic disease   . Protein-calorie malnutrition, severe 11/19/2019  . Gait disorder   . Chronic combined systolic and diastolic heart failure ([place])   . Left renal mass 07/21/2019  . CKD (chronic kidney disease) stage 4, GFR 15-29 ml/min (HCC) 04/23/2019  . Weight loss, non-intentional 08/26/2017  . 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 Acute on chronic anemia 12/21/2016  . Hyperlipidemia 09/13/2015  . 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 Essential hypertension     Past Surgical  History:  Procedure Laterality Date  . ABDOMINAL AORTOGRAM W/LOWER EXTREMITY N/A 02/09/2020   Procedure: ABDOMINAL AORTOGRAM W/LOWER EXTREMITY;  Surgeon: Serafina Mitchell, MD;  Location: [facility];  Service: Cardiovascular;  Laterality: N/A;  . ABDOMINAL AORTOGRAM W/LOWER EXTREMITY N/A 05/10/2020   Procedure: ABDOMINAL AORTOGRAM W/LOWER EXTREMITY;  Surgeon: Serafina Mitchell, MD;  Location: [facility];  Service: Cardiovascular;  Laterality: N/A;  . AMPUTATION Left 03/31/2020   Procedure: LEFT BELOW KNEE AMPUTATION;  Surgeon: Serafina Mitchell, MD;  Location: [facility];  Service: Vascular;  Laterality: Left;  MAC anesthesia with regional block   WILL NEED DIALYSIS THURSDAY  . AV FISTULA PLACEMENT Right 01/19/2020   Procedure: BRACHIOCEPHALIC ARTERIOVENOUS (AV) FISTULA CREATION;  Surgeon: Waynetta Sandy, MD;  Location: [facility];  Service: Vascular;  Laterality: Right;  . CATARACT EXTRACTION, BILATERAL    . COLONOSCOPY    . IR FLUORO GUIDE CV LINE RIGHT  01/14/2020  . IR THORACENTESIS ASP PLEURAL SPACE W/IMG GUIDE  09/21/2019  . IR THORACENTESIS ASP PLEURAL SPACE W/IMG GUIDE  10/16/2019  . IR US GUIDE VASC ACCESS RIGHT  01/15/2020  . LEFT HEART CATH AND CORONARY ANGIOGRAPHY N/A 11/19/2019   Procedure: LEFT HEART CATH AND CORONARY ANGIOGRAPHY;  Surgeon: Jettie Booze, MD;  Location: [facility];  Service: Cardiovascular;  Laterality: N/A;  . PERIPHERAL VASCULAR BALLOON ANGIOPLASTY Right 05/10/2020   Procedure: PERIPHERAL VASCULAR BALLOON ANGIOPLASTY;  Surgeon: Serafina Mitchell, MD;  Location: [facility];  Service: Cardiovascular;  Laterality: Right;  PT  . PERIPHERAL VASCULAR INTERVENTION Left 02/09/2020   Procedure: PERIPHERAL VASCULAR INTERVENTION;  Surgeon: Serafina Mitchell, MD;  Location: [facility];  Service: Cardiovascular;  Laterality: Left;  . POLYPECTOMY    . REFRACTIVE SURGERY  10/2017  . TEE WITHOUT CARDIOVERSION N/A 09/14/2015   Procedure:  TRANSESOPHAGEAL ECHOCARDIOGRAM (TEE);  Surgeon: Larey Dresser, MD;  Location: [facility];  Service: Cardiovascular;  Laterality: N/A;  . TRANSURETHRAL RESECTION OF PROSTATE N/A 10/20/2019   Procedure: TRANSURETHRAL RESECTION OF THE PROSTATE (TURP);  Surgeon: Irine Seal, MD;  Location: [facility];  Service: Urology;  Laterality: N/A;  . UPPER GASTROINTESTINAL ENDOSCOPY         Family History  Problem Relation Age of Onset  . Hypertension Mother   . Hyperlipidemia Mother   . Hyperlipidemia Father   . Colon cancer Neg Hx   . Colon polyps Neg Hx   . Esophageal cancer Neg Hx   . Rectal cancer Neg Hx   . Stomach cancer Neg Hx     Social History   Tobacco Use  . Smoking status: Former Smoker    Types: Cigarettes  . Smokeless tobacco: Former Systems developer    Types: Loss adjuster, chartered  Quit date: 07/09/1978  . Tobacco comment: quit yrs ago  Vaping Use  . Vaping Use: Never used  Substance Use Topics  . Alcohol use: Not Currently    Alcohol/week: 1.0 standard drink    Types: 1 Cans of beer per week    Comment: quit last march/2017  . Drug use: No    Home Medications Prior to Admission medications   Medication Sig Start Date End Date Taking? Authorizing Provider  Accu-Chek FastClix Lancets MISC Check blood sugar up to 7 times a week as instructed 12/03/19   Angiulli, Lavon Paganini, PA-C  acetaminophen (TYLENOL) 500 MG tablet Take 500 mg by mouth every 6 (six) hours as needed for moderate pain or headache.    [provider]  aspirin EC 81 MG tablet Take 1 tablet (81 mg total) by mouth daily. 10/22/19   Eugenie Filler, MD  atorvastatin (LIPITOR) 80 MG tablet Take 1 tablet (80 mg total) by mouth at bedtime. IM program 02/29/20   Madalyn Rob, MD  Blood Glucose Monitoring Suppl (ACCU-CHEK GUIDE) w/Device KIT 1 each by Does not apply route daily. Check blood sugar as instructed up to 7 times a week 12/03/19   Angiulli, Lavon Paganini, PA-C  clopidogrel (PLAVIX) 75 MG tablet Take 1 tablet (75 mg total) by mouth  daily. 05/10/20   Serafina Mitchell, MD  dorzolamide-timolol (COSOPT) 22.3-6.8 MG/ML ophthalmic solution Place 1 drop into the right eye 2 (two) times daily. 12/03/19   Angiulli, Lavon Paganini, PA-C  finasteride (PROSCAR) 5 MG tablet Take 1 tablet (5 mg total) by mouth daily. 02/29/20   Madalyn Rob, MD  glucose blood (ACCU-CHEK GUIDE) test strip Check blood sugar up to 7 times a week as instructed 12/03/19   Angiulli, Lavon Paganini, PA-C  lidocaine-prilocaine (EMLA) cream Apply 1 application topically daily as needed (prior to fistula use).    [provider]  oxyCODONE-acetaminophen (PERCOCET/ROXICET) 5-325 MG tablet Take 1 tablet by mouth every 8 (eight) hours as needed for moderate pain.  04/26/20   [provider]  polyethylene glycol (MIRALAX / GLYCOLAX) 17 g packet Take 17 g by mouth daily as needed for moderate constipation.     [provider]  sevelamer carbonate (RENVELA) 800 MG tablet Take 800 mg by mouth See admin instructions. Take 800 mg by mouth three times a day with food on Sun/Mon/Wed/Fri and two times a day on Tues/Thurs/Sat    [provider]  vitamin B-12 (CYANOCOBALAMIN) 1000 MCG tablet Take 1 tablet (1,000 mcg total) by mouth daily. 12/03/19   Angiulli, Lavon Paganini, PA-C    Allergies    Cefepime  Review of Systems   Review of Systems  Cardiovascular: Positive for syncope.  All other systems reviewed and are negative.   Physical Exam Updated Vital Signs BP 131/67   Pulse 92   Temp 97.7 F (36.5 C) (Axillary)   Resp 17   SpO2 100%   Physical Exam Vitals and nursing note reviewed.  Constitutional:      General: He is not in acute distress.    Appearance: He is well-developed and well-nourished.  HENT:     Head: Normocephalic and atraumatic.     Mouth/Throat:     Mouth: Oropharynx is clear and moist.  Eyes:     Extraocular Movements: EOM normal.     Pupils: Pupils are equal, round, and reactive to light.     Comments: The left eye pupil is  equal round and reactive.  Right pupil  is fixed and cornea is cloudy.  Conjunctive is pale  Cardiovascular:     Rate and Rhythm: Normal rate and regular rhythm.     Pulses: Intact distal pulses.     Heart sounds: Murmur heard.   Systolic murmur is present with a grade of 1/6.   Pulmonary:     Effort: Pulmonary effort is normal. No respiratory distress.     Breath sounds: Normal breath sounds. No wheezing or rales.  Abdominal:     General: There is no distension.     Palpations: Abdomen is soft.     Tenderness: There is no abdominal tenderness. There is no guarding or rebound.  Genitourinary:    Rectum: Guaiac result positive.  Musculoskeletal:        General: No tenderness or edema. Normal range of motion.     Cervical back: Normal range of motion and neck supple.     Comments: Left BKA with stump that appears well-healed without any ulceration.  Dialysis graft present in the right upper extremity with palpable thrill.  On the right foot the first toe is dark with no signs of capillary refill, the second toe is black and appears mildly necrotic and oozing blood.  No involvement of the third or fourth toe but a small ulcer present on the lateral portion of the fifth toe.  Patient has some mild erythema noted proximal over the metatarsals of the right toe but no warmth or signs of infection.  Skin:    General: Skin is warm and dry.     Coloration: Skin is pale.     Findings: No erythema or rash.  Neurological:     General: No focal deficit present.     Mental Status: He is alert.     Sensory: No sensory deficit.     Motor: No weakness.     Comments: Oriented to person and place.  Thought it was Wednesday instead of Thursday.  5 out of 5 strength in bilateral upper and lower extremity.  Sensation is intact in all extremities.  Psychiatric:        Mood and Affect: Mood and affect and mood normal.        Behavior: Behavior normal.        Thought Content: Thought content normal.      Comments: Calm and cooperative         ED Results / Procedures / Treatments   Labs (all labs ordered are listed, but only abnormal results are displayed) Labs Reviewed  CBC WITH DIFFERENTIAL/PLATELET - Abnormal; Notable for the following components:      Result Value   RBC 2.53 (*)    Hemoglobin 7.4 (*)    HCT 22.8 (*)    All other components within normal limits  BASIC METABOLIC PANEL - Abnormal; Notable for the following components:   Chloride 96 (*)    Glucose, Bld 406 (*)    Creatinine, Ser 2.13 (*)    Calcium 8.4 (*)    GFR, Estimated 33 (*)    All other components within normal limits  SARS CORONAVIRUS 2 (TAT 6-24 HRS)  POC OCCULT BLOOD, ED  TYPE AND SCREEN  PREPARE RBC (CROSSMATCH)    EKG EKG Interpretation  Date/Time:  Thursday July 28 2020 16:35:05 EST Ventricular Rate:  90 PR Interval:    QRS Duration: 165 QT Interval:  443 QTC Calculation: 543 R Axis:   -91 Text Interpretation: Sinus rhythm Atrial premature complexes Short PR interval RBBB and LAFB  LVH by voltage No significant change since last tracing Artifact Confirmed by Blanchie Dessert [phone]) on 07/28/2020 4:52:31 PM   Radiology DG Chest Port 1 View  Result Date: 07/28/2020 CLINICAL DATA:  Syncopal episode following dialysis therapy EXAM: PORTABLE CHEST 1 VIEW COMPARISON:  02/17/2020 FINDINGS: Cardiac shadows within normal limits. Aortic calcifications are seen. Previously seen dialysis catheter has been removed in the interval. The lungs are clear. No bony abnormality is noted. IMPRESSION: No active disease. Electronically Signed   By: Inez Catalina M.D.   On: 07/28/2020 17:09    Procedures Procedures (including critical care time)  Medications Ordered in ED Medications - No data to display  ED Course  I have reviewed the triage vital signs and the nursing notes.  Pertinent labs & imaging results that were available during my care of the patient were reviewed by me and considered in my  medical decision making (see chart for details).    MDM Rules/Calculators/A&P                          Elderly male with multiple medical problems presenting today after syncopal event after dialysis.  Patient is now awake and alert but not quite back to his baseline.  Vital signs are reassuring.  This occurred while patient was sitting waiting for his ride after dialysis.  Concern for possible anemia, orthostasis after pulling too much fluid.  He denies any infectious etiology at this time.  He is denying any chest pain and lower suspicion for PE or ACS.  Patient does not appear to have dysrhythmia at this time.  We will check labs and chest x-ray.  Blood sugar at dialysis was elevated prior to transport.  No signs of trauma.  Patient does take aspirin and Plavix for anticoagulation due to his PAD. Speaking with the patient's girlfriend to help him with all of his medical care he normally is unable to tell you the date, cannot say what street he lives on and does not know the president.  He seems to be at his baseline mental status wise.  Also there is the issue with this area on his right foot.  It appears that this is necrosis and probably arterial insufficiency.  Patient was seen by Dr. Trula Slade on 11/21 and at that time had balloon angioplasty of occluded vessels.  Wife reports in the last 1 to 2 weeks these areas have gradually worsened and she thought it is because he hit his foot.  We will get a plain film.  No palpable pulse on exam and will attempt to Doppler.  Will discuss with vascular.  They had talked with the office who recommended if he did not have a fever or symptoms of severe pain that he could follow-up at the clinic appointment on 11/31.  6:42 PM Today patient is found to be anemic with a hemoglobin of 7.4.  Last hemoglobin was 10.9 at the beginning of November.  We will do a Hemoccult to ensure no GI bleeding this could be related to repeated dialysis and chronic kidney disease however  feel that you will need transfusion.  X-rays without evidence of osteomyelitis and bedside Doppler was able to pick up a faint pulse.  We will discuss with vascular surgery.  We will transfuse 2 units and admit for further care.  Hemoccult was positive for fecal occult blood but stool was dark brown in appearance.  Will also consult GI.  Pt had seen Dr.  Fuller Plan with [facility] in 2019 after an abnormal colonscopy with adenoma removal.  Pt is also on plavix and asa but NSAIDs.  Pt given IV protonix.  MDM Number of Diagnoses or Management Options   Amount and/or Complexity of Data Reviewed Clinical lab tests: ordered and reviewed Tests in the radiology section of CPT: ordered and reviewed Tests in the medicine section of CPT: ordered and reviewed Decide to obtain previous medical records or to obtain history from someone other than the patient: yes Obtain history from someone other than the patient: yes Review and summarize past medical records: yes Discuss the patient with other providers: yes Independent visualization of images, tracings, or specimens: yes  Risk of Complications, Morbidity, and/or Mortality Presenting problems: high Diagnostic procedures: moderate Management options: high  Patient Progress Patient progress: stable    CRITICAL CARE Performed by: Coleen Cardiff Total critical care time: 30 minutes Critical care time was exclusive of separately billable procedures and treating other patients. Critical care was necessary to treat or prevent imminent or life-threatening deterioration. Critical care was time spent personally by me on the following activities: development of treatment plan with patient and/or surrogate as well as nursing, discussions with consultants, evaluation of patient's response to treatment, examination of patient, obtaining history from patient or surrogate, ordering and performing treatments and interventions, ordering and review of laboratory  studies, ordering and review of radiographic studies, pulse oximetry and re-evaluation of patient's condition.   Final Clinical Impression(s) / ED Diagnoses Final diagnoses:  Syncope and collapse  Anemia, unspecified type  Gastrointestinal hemorrhage, unspecified gastrointestinal hemorrhage type    Rx / DC Orders ED Discharge Orders    None       Blanchie Dessert, MD 07/28/20 1901    Blanchie Dessert, MD 07/28/20 2038

## 2020-07-28 NOTE — H&P (Signed)
Date: 07/28/2020               Patient Name:  Dennis Macias MRN: [id number]  DOB: 1951/02/17 Age / Sex: 70 y.o., male   PCP: Dennis Hansen, MD         Medical Service: Internal Medicine Teaching Service         Attending Physician: Dr. Joni Macias    First Contact: Dr. Alexandria Macias Pager: [phone]  Second Contact: Dr. Maudie Macias Pager: [phone]       After Hours (After 5p/  First Contact Pager: [phone]  weekends / holidays): Second Contact Pager: [phone]   Chief Complaint: syncope  History of Present Illness:   Dennis Macias is a 70 yo male with history of ESRD on HD TTS, T2DM, CVA (2017), HTN, HFmrEF with recovered EF, NSTEMI, and PVD s/p left BKA (on ASA and plavix) presenting with syncopal episode after dialysis session.   The dialysis facility reported that the patient received his full course of dialysis today and was subsequently sitting in his wheelchair in the waiting room while waiting for transportation when he suddenly had a syncopal event. Duration of syncope is unknown, but patient was placed in Trendelenburg with BP of 114/50. EMS arrived and noted patient to be awake with blood sugar >300, but oriented only to self. Patient's BP while sitting up found to be 88/40. Patient denies any prodromal symptoms prior to syncope. The last thing that he reports remembering is being on the recliner at the dialysis center. Denies chest pain, palpitations, SHOB, fevers, N/V, abdominal pain.   Patient does make some urine at home. Reports that he sometimes takes medications after dialysis if his BP gets too low, but cannot remember what medication.  As per girlfriend, patient's baseline is that he can answer questions but cannot tell you who the president is or what the date is.   Patient with history of PAD s/p L BKA. He is on ASA and plavix. Was seen by Dr. Trula Macias on 11/21, had balloon angioplastry of occluded vessels in R foot. Girlfriend reports that patient's right foot  gradually worsened within last 1-2 weeks, which she thought was from hitting his foot against the wall.  ROS only positive for facial hyperhidrosis when eating, but this appears to be a chronic issue.   ED Course: CBC showing Hgb 7.4. BMP showing glucose 406, Cr 2.13. CXR negative. DG R foot showing extensive arterial vascular calcifications, but no evidence of osteomyelitis. FOBT positive. ED ordered 2 units of pRBCs, but will monitor response to 1 unit before considering additional unit. Bedside dopplers was able pick up a faint pulse. ED provider consulted Dr. Donzetta Macias, vascular surgery, who recommended right LE duplex and ABI to evaluate for need for repeat balloon angioplasty vs amputation of 1st and 2nd toes on R foot..   Meds:  No current facility-administered medications on file prior to encounter.   Current Outpatient Medications on File Prior to Encounter  Medication Sig Dispense Refill  . Accu-Chek FastClix Lancets MISC Check blood sugar up to 7 times a week as instructed 102 each 3  . acetaminophen (TYLENOL) 500 MG tablet Take 500 mg by mouth every 6 (six) hours as needed for moderate pain or headache.    Marland Kitchen aspirin EC 81 MG tablet Take 1 tablet (81 mg total) by mouth daily.    Marland Kitchen atorvastatin (LIPITOR) 80 MG tablet Take 1 tablet (80 mg total) by mouth at bedtime. IM program 90 tablet 3  .  Blood Glucose Monitoring Suppl (ACCU-CHEK GUIDE) w/Device KIT 1 each by Does not apply route daily. Check blood sugar as instructed up to 7 times a week 1 kit 0  . clopidogrel (PLAVIX) 75 MG tablet Take 1 tablet (75 mg total) by mouth daily. 30 tablet 11  . dorzolamide-timolol (COSOPT) 22.3-6.8 MG/ML ophthalmic solution Place 1 drop into the right eye 2 (two) times daily. 10 mL 10  . finasteride (PROSCAR) 5 MG tablet Take 1 tablet (5 mg total) by mouth daily. 90 tablet 1  . glucose blood (ACCU-CHEK GUIDE) test strip Check blood sugar up to 7 times a week as instructed 100 each 3  . lidocaine-prilocaine  (EMLA) cream Apply 1 application topically daily as needed (prior to fistula use).    . polyethylene glycol (MIRALAX / GLYCOLAX) 17 g packet Take 17 g by mouth daily as needed for moderate constipation.     . sevelamer carbonate (RENVELA) 800 MG tablet Take 800 mg by mouth See admin instructions. Take 800 mg by mouth three times a day with food on Sun/Mon/Wed/Fri and two times a day on Tues/Thurs/Sat    . vitamin B-12 (CYANOCOBALAMIN) 1000 MCG tablet Take 1 tablet (1,000 mcg total) by mouth daily. 30 tablet 0     Allergies: Allergies as of 07/28/2020 - Review Complete 07/28/2020  Allergen Reaction Noted  . Cefepime Other (See Comments) 12/14/2019   Past Medical History:  Diagnosis Date  . Anemia   . Arthritis    past hx   . Blindness    right eye r/t diabetes per wife [name]  . Cardiorenal syndrome   . Cataract    removed both eyes  . CHF (congestive heart failure) (HCC)    hx  . CKD (chronic kidney disease)    Dialysis T Th Sat  . Dehydration   . Diabetes ([place])    type 2 - diet controlled, no meds  . Glaucoma   . History of CVA (cerebrovascular accident) 09/13/2015  . History of stroke 09/13/2015  . History of urinary retention   . HOH (hard of hearing)    no hearing aids  . Hyperlipidemia   . Hypertension   . NSTEMI (non-ST elevated myocardial infarction) ([place])   . Peripheral vascular disease ([place])   . Pernicious anemia 02/24/2018  . S/P TURP   . Stroke [facility])    2017- March, no deficit   . Syncope 11/2019  . Tachycardia 08/26/2017  . Tubular adenoma of colon 02/2017  . Walker as ambulation aid    and occasional uses cane  . Weight loss, non-intentional 08/26/2017   10 lbs between 6/18 & 2/19    Family History:  Family History  Problem Relation Age of Onset  . Hypertension Mother   . Hyperlipidemia Mother   . Hyperlipidemia Father   . Colon cancer Neg Hx   . Colon polyps Neg Hx   . Esophageal cancer Neg Hx   . Rectal cancer Neg Hx   . Stomach cancer Neg Hx       Social History:  Social History   Socioeconomic History  . Marital status: Married    Spouse name: Not on file  . Number of children: Not on file  . Years of education: Not on file  . Highest education level: Not on file  Occupational History  . Not on file  Tobacco Use  . Smoking status: Former Smoker    Types: Cigarettes  . Smokeless tobacco: Former Systems developer    Types: Loss adjuster, chartered  Quit date: 07/09/1978  . Tobacco comment: quit yrs ago  Vaping Use  . Vaping Use: Never used  Substance and Sexual Activity  . Alcohol use: Not Currently    Alcohol/week: 1.0 standard drink    Types: 1 Cans of beer per week    Comment: quit last march/2017  . Drug use: No  . Sexual activity: Not on file  Other Topics Concern  . Not on file  Social History Narrative  . Not on file   Social Determinants of Health   Financial Resource Strain: Not on file  Food Insecurity: Not on file  Transportation Needs: Not on file  Physical Activity: Not on file  Stress: Not on file  Social Connections: Not on file  Intimate Partner Violence: Not on file    Review of Systems: A complete ROS was negative except as per HPI.   Physical Exam: Blood pressure 133/70, pulse 77, temperature 97.7 F (36.5 C), temperature source Axillary, resp. rate 15, SpO2 100 %. Physical Exam Constitutional:      Appearance: Normal appearance.     Comments: Chronically ill-appearing elderly male, lying in bed, NAD.  HENT:     Head: Normocephalic and atraumatic.     Mouth/Throat:     Mouth: Mucous membranes are dry.     Pharynx: Oropharynx is clear.  Eyes:     Conjunctiva/sclera: Conjunctivae normal.     Comments: Clouding of lens of right eye. Unable to see from right eye. Left eye EOMI and reactive to light.  Cardiovascular:     Rate and Rhythm: Normal rate and regular rhythm.     Pulses: Normal pulses.     Heart sounds: Normal heart sounds. No friction rub. No gallop.   Pulmonary:     Effort: Pulmonary effort is  normal.     Breath sounds: Normal breath sounds. No wheezing, rhonchi or rales.  Abdominal:     General: Bowel sounds are normal. There is no distension.     Palpations: Abdomen is soft.     Tenderness: There is no abdominal tenderness.  Musculoskeletal:        General: No swelling.     Cervical back: Normal range of motion.     Comments: Left BKA  Skin:    Comments: RLE cool and dry. Ischemic changes/possible necrosis of right 1st and 2nd digits. Non-blanching erythema noted on dorsum of right foot. Nontender to palpation of right foot. Please see images below.  Neurological:     General: No focal deficit present.     Mental Status: He is alert. Mental status is at baseline.     Cranial Nerves: Cranial nerves are intact. No cranial nerve deficit, dysarthria or facial asymmetry.     Comments: CN grossly intact. Motor strength 5/5 bilaterally in upper and lower extremities. Sensation mostly intact, absent only in 1st and 2nd digits of right foot. Unable to assess gait as patient has left BKA.  Psychiatric:        Mood and Affect: Mood normal.        Behavior: Behavior normal.        Thought Content: Thought content normal.                EKG EKG Interpretation  Date/Time:                  Thursday July 28 2020 16:35:05 EST Ventricular Rate:         90 PR Interval:  QRS Duration: 165 QT Interval:                 443 QTC Calculation:        543 R Axis:                         -91 Text Interpretation:      Sinus rhythm Atrial premature complexes Short PR interval RBBB and LAFB LVH by voltage No significant change since last tracing Artifact Confirmed by Blanchie Dessert [phone]) on 07/28/2020 4:52:31 PM   DG Chest Port 1 View  Result Date: 07/28/2020 CLINICAL DATA:  Syncopal episode following dialysis therapy EXAM: PORTABLE CHEST 1 VIEW COMPARISON:  02/17/2020 FINDINGS: Cardiac shadows within normal limits. Aortic calcifications are seen. Previously  seen dialysis catheter has been removed in the interval. The lungs are clear. No bony abnormality is noted. IMPRESSION: No active disease. Electronically Signed   By: Inez Catalina M.D.   On: 07/28/2020 17:09   DG Foot Complete Right  Result Date: 07/28/2020 CLINICAL DATA:  NECROSIS OF 1ST AND 2ND TOE PER ORDER / PATIENT STATES HE IS DIABETICthe EXAM: RIGHT FOOT COMPLETE - 3+ VIEW COMPARISON:  None. FINDINGS: No osseous erosion associated with the first or second toe. No soft tissue gas or ulceration identified. Extensive arterial vascular calcifications noted. IMPRESSION: No evidence of osteomyelitis. Electronically Signed   By: Suzy Bouchard M.D.   On: 07/28/2020 18:31    Assessment & Plan by Problem: Active Problems:   Syncope  Dennis Macias is a 70 yo male with history of ESRD on HD TTS, T2DM, CVA (2017), HTN, HLD, HFmrEF with recovered EF, NSTEMI, and PVD s/p left BKA (on ASA and plavix) presenting with syncopal episode after dialysis session.   Syncopal Episode Hypotension Hx of multiple prior CVAs (2017, 2021) Patient presenting with syncopal episode after dialysis session. Was hypotensive on arrival, but this spontaneously resolved in the ED. Patient was sitting on wheelchair when he syncopized so likely not orthostatic hypotension as the cause. On arrival, he was not hypoglycemic, no electrolyte abnormalities, and EKG showing NSR with findings unchanged from priors. He is on ASA and plavix due to hx of CVA. Previous history of multiple CVAs [place] (in [place]) along with concomitant syncopal episodes, likely associated with hypotension and decreased cerebral perfusion post-HD. This presentation is similar to priors so will order MRI brain w/o contrast despite unremarkable neuro exam. Patient had interval drop in Hgb down to 7.4 (appears that baseline is around 8-9). Also had positive FOBT and dark brown stool. ED provider was concerned for possible GI bleed as a cause for  syncope and ordered 2 units of pRBCs. As per patient, he has not noted any dark stools or bloody stools at home. Will give 1 unit pRBC given patient's history of ischemia and monitor response, but will hold off on 2nd unit at this time. GI consulted by ED provider. Last GI visit with Dr. Fuller Plan at [place] in 2019 after an abnormal colonoscopy with adenoma removal. Of note, patient is on DAPT due to hx of CVA and PAD, received last dose this AM. -resume home ASA and plavix starting tomorrow -f/u MRI brain -f/u orthostatics -1 unit pRBC -f/u post-transfusion H and H -can consider neuro consult if MRI findings concerning for stroke -GI consulted for suspected GI bleed, f/u recs  R 1st and 2nd digit ulcerations Hx of PVD s/p L BKA On ASA and plavix for anticoagulation. Right  1st and 2nd digits with ulcerations and loss of sensation 2/2 PAD. Given patient's history, ulcerations are mostly consistent with ischemic changes/possible necrosis. Bedside dopplers picked up a faint pulse in RLE. Dr. Donzetta Macias, vascular surgery, consulted. Recommended RLE duplex and ABI to assess for need for repeat balloon angioplasty vs amputation of R 1st and 2nd toes. There is also an area of ulceration on lateral side of 5th digit. -vascular surgery consulted, appreciate recs -f/u RLE duplex -f/u ABI  ESRD with HD TTS Patient regularly attends HD 3 days each week. Appears to have history of post-dialysis syncopal episodes, which may be related to history of strokes. -can consult nephrology in the AM to set up inpatient HD while here  HTN HLD Patient with history of HTN, not on any home medications. On lipitor 1m qhs at home. -resume lipitor  T2DM Not on any home medications. Blood glucose 406 on admission. -SSI   Dispo: Admit patient to Observation with expected length of stay less than 2 midnights.  Signed: JVirl Axe MD 07/28/2020, 10:33 PM  Pager: [phone] 5pm on weekdays and 1pm on  weekends: On Call pager: [phone]

## 2020-07-28 NOTE — ED Notes (Signed)
Pt girlfriend [name] called for update please call [phone]

## 2020-07-29 ENCOUNTER — Observation Stay (HOSPITAL_COMMUNITY): Payer: Medicare PPO

## 2020-07-29 ENCOUNTER — Telehealth: Payer: Medicare PPO

## 2020-07-29 DIAGNOSIS — E1122 Type 2 diabetes mellitus with diabetic chronic kidney disease: Secondary | ICD-10-CM | POA: Diagnosis present

## 2020-07-29 DIAGNOSIS — Z20822 Contact with and (suspected) exposure to covid-19: Secondary | ICD-10-CM | POA: Diagnosis present

## 2020-07-29 DIAGNOSIS — I38 Endocarditis, valve unspecified: Secondary | ICD-10-CM | POA: Diagnosis not present

## 2020-07-29 DIAGNOSIS — R509 Fever, unspecified: Secondary | ICD-10-CM | POA: Diagnosis not present

## 2020-07-29 DIAGNOSIS — Z8673 Personal history of transient ischemic attack (TIA), and cerebral infarction without residual deficits: Secondary | ICD-10-CM

## 2020-07-29 DIAGNOSIS — A4101 Sepsis due to Methicillin susceptible Staphylococcus aureus: Secondary | ICD-10-CM | POA: Diagnosis not present

## 2020-07-29 DIAGNOSIS — K2100 Gastro-esophageal reflux disease with esophagitis, without bleeding: Secondary | ICD-10-CM | POA: Diagnosis not present

## 2020-07-29 DIAGNOSIS — D631 Anemia in chronic kidney disease: Secondary | ICD-10-CM | POA: Diagnosis present

## 2020-07-29 DIAGNOSIS — R29700 NIHSS score 0: Secondary | ICD-10-CM | POA: Diagnosis present

## 2020-07-29 DIAGNOSIS — L0390 Cellulitis, unspecified: Secondary | ICD-10-CM

## 2020-07-29 DIAGNOSIS — Z89512 Acquired absence of left leg below knee: Secondary | ICD-10-CM | POA: Diagnosis not present

## 2020-07-29 DIAGNOSIS — L97529 Non-pressure chronic ulcer of other part of left foot with unspecified severity: Secondary | ICD-10-CM | POA: Diagnosis not present

## 2020-07-29 DIAGNOSIS — I639 Cerebral infarction, unspecified: Secondary | ICD-10-CM | POA: Diagnosis not present

## 2020-07-29 DIAGNOSIS — Z7982 Long term (current) use of aspirin: Secondary | ICD-10-CM | POA: Diagnosis not present

## 2020-07-29 DIAGNOSIS — B9561 Methicillin susceptible Staphylococcus aureus infection as the cause of diseases classified elsewhere: Secondary | ICD-10-CM | POA: Diagnosis not present

## 2020-07-29 DIAGNOSIS — I953 Hypotension of hemodialysis: Secondary | ICD-10-CM

## 2020-07-29 DIAGNOSIS — Z992 Dependence on renal dialysis: Secondary | ICD-10-CM | POA: Diagnosis not present

## 2020-07-29 DIAGNOSIS — I5043 Acute on chronic combined systolic (congestive) and diastolic (congestive) heart failure: Secondary | ICD-10-CM | POA: Diagnosis not present

## 2020-07-29 DIAGNOSIS — I120 Hypertensive chronic kidney disease with stage 5 chronic kidney disease or end stage renal disease: Secondary | ICD-10-CM

## 2020-07-29 DIAGNOSIS — I739 Peripheral vascular disease, unspecified: Secondary | ICD-10-CM

## 2020-07-29 DIAGNOSIS — R195 Other fecal abnormalities: Secondary | ICD-10-CM

## 2020-07-29 DIAGNOSIS — D649 Anemia, unspecified: Secondary | ICD-10-CM | POA: Diagnosis not present

## 2020-07-29 DIAGNOSIS — I63313 Cerebral infarction due to thrombosis of bilateral middle cerebral arteries: Secondary | ICD-10-CM

## 2020-07-29 DIAGNOSIS — I96 Gangrene, not elsewhere classified: Secondary | ICD-10-CM | POA: Diagnosis not present

## 2020-07-29 DIAGNOSIS — N186 End stage renal disease: Secondary | ICD-10-CM | POA: Diagnosis present

## 2020-07-29 DIAGNOSIS — Z79899 Other long term (current) drug therapy: Secondary | ICD-10-CM | POA: Diagnosis not present

## 2020-07-29 DIAGNOSIS — I252 Old myocardial infarction: Secondary | ICD-10-CM | POA: Diagnosis not present

## 2020-07-29 DIAGNOSIS — I6389 Other cerebral infarction: Secondary | ICD-10-CM | POA: Diagnosis not present

## 2020-07-29 DIAGNOSIS — D500 Iron deficiency anemia secondary to blood loss (chronic): Secondary | ICD-10-CM

## 2020-07-29 DIAGNOSIS — K921 Melena: Secondary | ICD-10-CM | POA: Diagnosis not present

## 2020-07-29 DIAGNOSIS — R55 Syncope and collapse: Secondary | ICD-10-CM | POA: Diagnosis not present

## 2020-07-29 DIAGNOSIS — A419 Sepsis, unspecified organism: Secondary | ICD-10-CM | POA: Diagnosis not present

## 2020-07-29 DIAGNOSIS — I214 Non-ST elevation (NSTEMI) myocardial infarction: Secondary | ICD-10-CM | POA: Diagnosis not present

## 2020-07-29 DIAGNOSIS — R29702 NIHSS score 2: Secondary | ICD-10-CM | POA: Diagnosis not present

## 2020-07-29 DIAGNOSIS — Z7902 Long term (current) use of antithrombotics/antiplatelets: Secondary | ICD-10-CM | POA: Diagnosis not present

## 2020-07-29 DIAGNOSIS — I70261 Atherosclerosis of native arteries of extremities with gangrene, right leg: Secondary | ICD-10-CM | POA: Diagnosis present

## 2020-07-29 DIAGNOSIS — N2581 Secondary hyperparathyroidism of renal origin: Secondary | ICD-10-CM | POA: Diagnosis present

## 2020-07-29 DIAGNOSIS — R7989 Other specified abnormal findings of blood chemistry: Secondary | ICD-10-CM | POA: Diagnosis not present

## 2020-07-29 DIAGNOSIS — I132 Hypertensive heart and chronic kidney disease with heart failure and with stage 5 chronic kidney disease, or end stage renal disease: Secondary | ICD-10-CM | POA: Diagnosis present

## 2020-07-29 DIAGNOSIS — R112 Nausea with vomiting, unspecified: Secondary | ICD-10-CM | POA: Diagnosis not present

## 2020-07-29 DIAGNOSIS — D62 Acute posthemorrhagic anemia: Secondary | ICD-10-CM | POA: Diagnosis not present

## 2020-07-29 DIAGNOSIS — E785 Hyperlipidemia, unspecified: Secondary | ICD-10-CM

## 2020-07-29 DIAGNOSIS — I10 Essential (primary) hypertension: Secondary | ICD-10-CM | POA: Diagnosis not present

## 2020-07-29 DIAGNOSIS — R7881 Bacteremia: Secondary | ICD-10-CM | POA: Diagnosis not present

## 2020-07-29 DIAGNOSIS — L97519 Non-pressure chronic ulcer of other part of right foot with unspecified severity: Secondary | ICD-10-CM | POA: Diagnosis not present

## 2020-07-29 DIAGNOSIS — I5042 Chronic combined systolic (congestive) and diastolic (congestive) heart failure: Secondary | ICD-10-CM | POA: Diagnosis present

## 2020-07-29 DIAGNOSIS — D510 Vitamin B12 deficiency anemia due to intrinsic factor deficiency: Secondary | ICD-10-CM | POA: Diagnosis present

## 2020-07-29 DIAGNOSIS — E1152 Type 2 diabetes mellitus with diabetic peripheral angiopathy with gangrene: Secondary | ICD-10-CM | POA: Diagnosis present

## 2020-07-29 DIAGNOSIS — G319 Degenerative disease of nervous system, unspecified: Secondary | ICD-10-CM | POA: Diagnosis not present

## 2020-07-29 DIAGNOSIS — E11319 Type 2 diabetes mellitus with unspecified diabetic retinopathy without macular edema: Secondary | ICD-10-CM | POA: Diagnosis present

## 2020-07-29 LAB — CBC
HCT: 21.7 % — ABNORMAL LOW (ref 39.0–52.0)
Hemoglobin: 7.3 g/dL — ABNORMAL LOW (ref 13.0–17.0)
MCH: 30.2 pg (ref 26.0–34.0)
MCHC: 33.6 g/dL (ref 30.0–36.0)
MCV: 89.7 fL (ref 80.0–100.0)
Platelets: 197 10*3/uL (ref 150–400)
RBC: 2.42 MIL/uL — ABNORMAL LOW (ref 4.22–5.81)
RDW: 13.8 % (ref 11.5–15.5)
WBC: 7.5 10*3/uL (ref 4.0–10.5)
nRBC: 0 % (ref 0.0–0.2)

## 2020-07-29 LAB — GLUCOSE, CAPILLARY
Glucose-Capillary: 303 mg/dL — ABNORMAL HIGH (ref 70–99)
Glucose-Capillary: 236 mg/dL — ABNORMAL HIGH (ref 70–99)
Glucose-Capillary: 345 mg/dL — ABNORMAL HIGH (ref 70–99)
Glucose-Capillary: 198 mg/dL — ABNORMAL HIGH (ref 70–99)

## 2020-07-29 LAB — CBG MONITORING, ED
Glucose-Capillary: 240 mg/dL — ABNORMAL HIGH (ref 70–99)
Glucose-Capillary: 220 mg/dL — ABNORMAL HIGH (ref 70–99)

## 2020-07-29 LAB — HEMOGLOBIN AND HEMATOCRIT, BLOOD
HCT: 21.9 % — ABNORMAL LOW (ref 39.0–52.0)
Hemoglobin: 7.6 g/dL — ABNORMAL LOW (ref 13.0–17.0)

## 2020-07-29 LAB — POC OCCULT BLOOD, ED: Fecal Occult Bld: POSITIVE — AB

## 2020-07-29 LAB — SARS CORONAVIRUS 2 (TAT 6-24 HRS): SARS Coronavirus 2: NEGATIVE

## 2020-07-29 MED ORDER — CHLORHEXIDINE GLUCONATE CLOTH 2 % EX PADS
6.0000 | MEDICATED_PAD | Freq: Every day | CUTANEOUS | Status: DC
  Administered 2020-07-29 – 2020-07-30 (×2): 6 via TOPICAL

## 2020-07-29 MED ORDER — LORAZEPAM 2 MG/ML IJ SOLN
0.5000 mg | Freq: Once | INTRAMUSCULAR | Status: AC
  Administered 2020-07-29: 0.5 mg via INTRAVENOUS
  Filled 2020-07-29: qty 1

## 2020-07-29 MED ORDER — INSULIN GLARGINE 100 UNIT/ML ~~LOC~~ SOLN
7.0000 [IU] | Freq: Every day | SUBCUTANEOUS | Status: DC
  Administered 2020-07-29 – 2020-08-18 (×20): 7 [IU] via SUBCUTANEOUS
  Filled 2020-07-29 (×24): qty 0.07

## 2020-07-29 MED ORDER — PANTOPRAZOLE SODIUM 40 MG PO TBEC: 40.0000 mg | DELAYED_RELEASE_TABLET | Freq: Every day | ORAL | Status: DC

## 2020-07-29 MED ORDER — PANTOPRAZOLE SODIUM 40 MG PO TBEC
40.0000 mg | DELAYED_RELEASE_TABLET | Freq: Two times a day (BID) | ORAL | Status: DC
  Administered 2020-07-29 – 2020-08-01 (×7): 40 mg via ORAL
  Filled 2020-07-29 (×7): qty 1

## 2020-07-29 NOTE — Consult Note (Addendum)
[facility] Consult: 1:14 PM 07/29/2020  LOS: 0 days    Referring Provider:  Dr. Virl Axe Primary Care Physician:  [name], MD Primary Gastroenterologist:     Reason for Consultation: Anemia.   FOBT + brown stool.     HPI: Dennis Macias is a 70 y.o. male.  PMH severe hypertension.  DM 2, no diabetic meds on outpt med list.  ESRD on HD.  CVA.   Heart failure.  Peripheral vascular disease.  Left BKA.  03/2020 left BKA.  11/21 balloon angioplasty of right lower extremity vessel.  On DAPT.  Hepatic steatosis by CT abdomen pelvis in 2019.  Nonspecific bowel wall thickening on CT of 09/2019, attributed to hypoalbuminemia.  BPH.  10/2019 TURP.  Anemia requiring blood transfusions, pernicious anemia.  Bone marrow aspiration 2019.  Gustatory hyperhidrosis.  Malnutrition.  02/2017 screening colonoscopy.  A total of 4 polyps were removed from the ascending colon and repeat rectum.  Size of polyps 6 to 16 mm.  The larger rectal polyps were removed piecemeal.  Nonbleeding internal hemorrhoids. Pathology showed tubular adenomas without high-grade dysplasia. 11/2017 EGD for anemia, weight loss.  Gastritis.  Solitary gastric polyp.  2 nonbleeding gastric AVMs.  Duodenum normal to second duodenum. Gastric path showed severe chronic gastritis with ulceration.  H. pylori organisms presents.  No intestinal metaplasia.  Polyp pathology showed polypoid fragments of gastric mucosa with severe chronic gastritis and presence of H. pylori.  No intestinal metaplasia. H. pylori was treated with 2 weeks of Pylera. 03/2018 colonoscopy for follow-up of previous piecemeal removal of adenomatous colon polyp:  Two, 7 mm, sessile polyps removed from sigmoid and ileocecal valve.  Post polypectomy scar in rectum.  Otherwise normal study.   Pathology revealed tubular adenomas with out HGD.   Chronic anemia with Hb 7.8 on 9/26, 04/04/2020. Hgb 11.1 on 05/02/2020.  10.9 on 05/10/2020 Transfused 1 PRBC in mid July 2021.  2 PRBCs in late September 2021.  Patient completed his dialysis yesterday afternoon.  Syncopal spell while waiting for transportation in the waiting room postdialysis.  Placed in Trendelenburg, BP 114/50, but 88/40 after sitting up.  Blood sugar > than 300 and patient oriented only to his self Baseline cognition is that he can answer simple questions but cannot tell you who the president is or what date it is. Girlfriend relays that patient's right foot problems seems worse in the last 1 to 2 weeks which she attributed to him hitting his foot against the wall. Chronic facial hyperhidrosis while eating.  Patient denies black or bloody stools.  Mostly his girlfriend cleans up after he has a bowel movement and flushes the toilet but she has never said anything to him about suspicious looking stools.  His appetite is good.  Despite having only 1, partial/cracked tooth remaining in his mouth, he denies problems with eating related choking.  Denies abdominal pain, weight loss.  Uses the Plavix/aspirin but no NSAIDs.  No alcohol.  Hgb 7.4 >> 1 PRBC >> repeat not resulted.   Stool on DRE in emergency department  showed brown, FOBT positive. BUN/creatinine elevated but improved compared with September.  Platelets normal.  PT, INR not obtained.  GFR 33, was as low as 11. Serum glucose 406. COVID 19 negative. MR brain shows acute left thalamic infarct, moderate cerebral atrophy and advanced chronic microvascular ischemic disease.  Multiple chronic microhemorrhages scattered throughout both hemispheres favored related to chronic poorly controlled hypertension.     Past Medical History:  Diagnosis Date  . Anemia   . Arthritis    past hx   . Blindness    right eye r/t diabetes per wife [name]  . Cardiorenal syndrome   . Cataract     removed both eyes  . CHF (congestive heart failure) (HCC)    hx  . CKD (chronic kidney disease)    Dialysis T Th Sat  . Dehydration   . Diabetes ([place])    type 2 - diet controlled, no meds  . Glaucoma   . History of CVA (cerebrovascular accident) 09/13/2015  . History of stroke 09/13/2015  . History of urinary retention   . HOH (hard of hearing)    no hearing aids  . Hyperlipidemia   . Hypertension   . NSTEMI (non-ST elevated myocardial infarction) ([place])   . Peripheral vascular disease ([place])   . Pernicious anemia 02/24/2018  . S/P TURP   . Stroke [facility])    2017- March, no deficit   . Syncope 11/2019  . Tachycardia 08/26/2017  . Tubular adenoma of colon 02/2017  . Walker as ambulation aid    and occasional uses cane  . Weight loss, non-intentional 08/26/2017   10 lbs between 6/18 & 2/19    Past Surgical History:  Procedure Laterality Date  . ABDOMINAL AORTOGRAM W/LOWER EXTREMITY N/A 02/09/2020   Procedure: ABDOMINAL AORTOGRAM W/LOWER EXTREMITY;  Surgeon: Serafina Mitchell, MD;  Location: [facility];  Service: Cardiovascular;  Laterality: N/A;  . ABDOMINAL AORTOGRAM W/LOWER EXTREMITY N/A 05/10/2020   Procedure: ABDOMINAL AORTOGRAM W/LOWER EXTREMITY;  Surgeon: Serafina Mitchell, MD;  Location: [facility];  Service: Cardiovascular;  Laterality: N/A;  . AMPUTATION Left 03/31/2020   Procedure: LEFT BELOW KNEE AMPUTATION;  Surgeon: Serafina Mitchell, MD;  Location: [facility];  Service: Vascular;  Laterality: Left;  MAC anesthesia with regional block   WILL NEED DIALYSIS THURSDAY  . AV FISTULA PLACEMENT Right 01/19/2020   Procedure: BRACHIOCEPHALIC ARTERIOVENOUS (AV) FISTULA CREATION;  Surgeon: Waynetta Sandy, MD;  Location: [facility];  Service: Vascular;  Laterality: Right;  . CATARACT EXTRACTION, BILATERAL    . COLONOSCOPY    . IR FLUORO GUIDE CV LINE RIGHT  01/14/2020  . IR THORACENTESIS ASP PLEURAL SPACE W/IMG GUIDE  09/21/2019  . IR THORACENTESIS ASP PLEURAL SPACE W/IMG  GUIDE  10/16/2019  . IR US GUIDE VASC ACCESS RIGHT  01/15/2020  . LEFT HEART CATH AND CORONARY ANGIOGRAPHY N/A 11/19/2019   Procedure: LEFT HEART CATH AND CORONARY ANGIOGRAPHY;  Surgeon: Jettie Booze, MD;  Location: [facility];  Service: Cardiovascular;  Laterality: N/A;  . PERIPHERAL VASCULAR BALLOON ANGIOPLASTY Right 05/10/2020   Procedure: PERIPHERAL VASCULAR BALLOON ANGIOPLASTY;  Surgeon: Serafina Mitchell, MD;  Location: [facility];  Service: Cardiovascular;  Laterality: Right;  PT  . PERIPHERAL VASCULAR INTERVENTION Left 02/09/2020   Procedure: PERIPHERAL VASCULAR INTERVENTION;  Surgeon: Serafina Mitchell, MD;  Location: [facility];  Service: Cardiovascular;  Laterality: Left;  . POLYPECTOMY    . REFRACTIVE SURGERY  10/2017  .  TEE WITHOUT CARDIOVERSION N/A 09/14/2015   Procedure: TRANSESOPHAGEAL ECHOCARDIOGRAM (TEE);  Surgeon: Larey Dresser, MD;  Location: [facility];  Service: Cardiovascular;  Laterality: N/A;  . TRANSURETHRAL RESECTION OF PROSTATE N/A 10/20/2019   Procedure: TRANSURETHRAL RESECTION OF THE PROSTATE (TURP);  Surgeon: Irine Seal, MD;  Location: [facility];  Service: Urology;  Laterality: N/A;  . UPPER GASTROINTESTINAL ENDOSCOPY      Prior to Admission medications   Medication Sig Start Date End Date Taking? Authorizing Provider  Accu-Chek FastClix Lancets MISC Check blood sugar up to 7 times a week as instructed 12/03/19  Yes Angiulli, Lavon Paganini, PA-C  acetaminophen (TYLENOL) 500 MG tablet Take 500 mg by mouth every 6 (six) hours as needed for moderate pain or headache.   Yes [provider]  aspirin EC 81 MG tablet Take 1 tablet (81 mg total) by mouth daily. 10/22/19  Yes Eugenie Filler, MD  atorvastatin (LIPITOR) 80 MG tablet Take 1 tablet (80 mg total) by mouth at bedtime. IM program 02/29/20  Yes Madalyn Rob, MD  Blood Glucose Monitoring Suppl (ACCU-CHEK GUIDE) w/Device KIT 1 each by Does not apply route daily. Check blood sugar as  instructed up to 7 times a week 12/03/19  Yes Angiulli, Lavon Paganini, PA-C  clopidogrel (PLAVIX) 75 MG tablet Take 1 tablet (75 mg total) by mouth daily. 05/10/20  Yes Serafina Mitchell, MD  dorzolamide-timolol (COSOPT) 22.3-6.8 MG/ML ophthalmic solution Place 1 drop into the right eye 2 (two) times daily. 12/03/19  Yes Angiulli, Lavon Paganini, PA-C  finasteride (PROSCAR) 5 MG tablet Take 1 tablet (5 mg total) by mouth daily. 02/29/20  Yes Madalyn Rob, MD  glucose blood (ACCU-CHEK GUIDE) test strip Check blood sugar up to 7 times a week as instructed 12/03/19  Yes Angiulli, Lavon Paganini, PA-C  lidocaine-prilocaine (EMLA) cream Apply 1 application topically daily as needed (prior to fistula use).   Yes [provider]  polyethylene glycol (MIRALAX / GLYCOLAX) 17 g packet Take 17 g by mouth daily as needed for moderate constipation.    Yes [provider]  sevelamer carbonate (RENVELA) 800 MG tablet Take 800 mg by mouth See admin instructions. Take 800 mg by mouth three times a day with food on Sun/Mon/Wed/Fri and two times a day on Tues/Thurs/Sat   Yes [provider]  vitamin B-12 (CYANOCOBALAMIN) 1000 MCG tablet Take 1 tablet (1,000 mcg total) by mouth daily. 12/03/19  Yes Angiulli, Lavon Paganini, PA-C    Scheduled Meds: . sodium chloride   Intravenous Once  . aspirin EC  81 mg Oral Daily  . atorvastatin  80 mg Oral QHS  . Chlorhexidine Gluconate Cloth  6 each Topical Daily  . clopidogrel  75 mg Oral Daily  . dorzolamide-timolol  1 drop Right Eye BID  . finasteride  5 mg Oral Daily  . insulin aspart  0-6 Units Subcutaneous TID WC  . sevelamer carbonate  800 mg Oral 3 times per day on Sun Mon Wed Fri   And  . [START ON 07/30/2020] sevelamer carbonate  800 mg Oral 2 times per day on Tue Thu Sat  . sodium chloride flush  3 mL Intravenous Q12H   Infusions:  PRN Meds: acetaminophen **OR** acetaminophen   Allergies as of 07/28/2020 - Review Complete 07/28/2020  Allergen Reaction Noted   . Cefepime Other (See Comments) 12/14/2019    Family History  Problem Relation Age of Onset  . Hypertension Mother   . Hyperlipidemia Mother   . Hyperlipidemia  Father   . Colon cancer Neg Hx   . Colon polyps Neg Hx   . Esophageal cancer Neg Hx   . Rectal cancer Neg Hx   . Stomach cancer Neg Hx     Social History   Socioeconomic History  . Marital status: Married    Spouse name: Not on file  . Number of children: Not on file  . Years of education: Not on file  . Highest education level: Not on file  Occupational History  . Not on file  Tobacco Use  . Smoking status: Former Smoker    Types: Cigarettes  . Smokeless tobacco: Former Systems developer    Types: Chew    Quit date: 07/09/1978  . Tobacco comment: quit yrs ago  Vaping Use  . Vaping Use: Never used  Substance and Sexual Activity  . Alcohol use: Not Currently    Alcohol/week: 1.0 standard drink    Types: 1 Cans of beer per week    Comment: quit last march/2017  . Drug use: No  . Sexual activity: Not on file  Other Topics Concern  . Not on file  Social History Narrative  . Not on file   Social Determinants of Health   Financial Resource Strain: Not on file  Food Insecurity: Not on file  Transportation Needs: Not on file  Physical Activity: Not on file  Stress: Not on file  Social Connections: Not on file  Intimate Partner Violence: Not on file    REVIEW OF SYSTEMS: Constitutional: Denies fatigue or weakness.  Wheelchair mobility since undergoing the BKA in September. ENT:  No nose bleeds Pulm: Denies shortness of breath and cough. CV:  No palpitations, no LE edema.  No angina GU: Anuric GI: See HPI Heme: Denies unusual or excessive bleeding or bruising. Transfusions: See HPI Neuro: Syncope, no seizures as per HPI.  No headaches, no peripheral tingling or numbness Derm:  No itching, no rash or sores.  Endocrine:  No sweats or chills.  No polyuria or dysuria Immunization: Says he has been immunized for  COVID-19. Travel:  None beyond local counties in last few months.    PHYSICAL EXAM: Vital signs in last 24 hours: Vitals:   07/29/20 1000 07/29/20 1204  BP: (!) 141/70 140/74  Pulse:  68  Resp: 19 18  Temp:  98 F (36.7 C)  SpO2:  100%   Wt Readings from Last 3 Encounters:  07/29/20 69.8 kg  05/10/20 66.7 kg  05/02/20 62.1 kg    General: Does not, calm, chronically ill appearing gentleman with ashen coloration Head: No facial asymmetry or swelling.  No signs of head trauma. Eyes: Conjunctiva slightly pale.  No scleral icterus.  Cloudy right eye lens. Ears: Somewhat hard of hearing. Nose: No congestion or discharge Mouth: Mucosa is pink, moist, clear.  Edentulous except for 1 cracked molar. Neck: No JVD, no masses, no thyromegaly Lungs: No labored breathing or cough.  Lungs CTA bilaterally. Heart: RRR.  No MRG. Abdomen: Not tender, soft, not distended.  Active bowel sounds.  No HSM, masses, bruits, hernias.   Rectal: Did not repeat the DRE performed in the ED at which time he had brown but FOBT positive stool. Musc/Skeltl: Left BKA site is covered with Ace wrap which is clean and dry. Extremities: Dark eschar on a few of his right toes with dusky appearance to some of the skin across the top of the same foot. Neurologic: Grossly full grip and arm strength bilaterally.  Did not test  lower extremity strength.  No tremors.  Follows simple commands.  Oriented to himself but not to the name of the hospital or the year.  Responds appropriately to questions and sometimes his answers are 'I dont know" Skin: Ashen, unhealthy coloration.  No telangiectasia.   Ischemic looking sores on the toes as described above. Tattoos: None observed Nodes: No cervical adenopathy Psych:, Cooperative, pleasant.  Intake/Output from previous day: No intake/output data recorded. Intake/Output this shift: Total I/O In: 328 [Blood:328] Out: -   LAB RESULTS: Recent Labs    07/28/20 1750  WBC 9.5   HGB 7.4*  HCT 22.8*  PLT 206   BMET Lab Results  Component Value Date   NA 137 07/28/2020   NA 138 05/10/2020   NA 136 04/05/2020   K 3.7 07/28/2020   K 3.6 05/10/2020   K 3.4 (L) 04/05/2020   CL 96 (L) 07/28/2020   CL 93 (L) 05/10/2020   CL 100 04/05/2020   CO2 28 07/28/2020   CO2 27 04/05/2020   CO2 24 04/04/2020   GLUCOSE 406 (H) 07/28/2020   GLUCOSE 251 (H) 05/10/2020   GLUCOSE 173 (H) 04/05/2020   BUN 23 07/28/2020   BUN 28 (H) 05/10/2020   BUN 25 (H) 04/05/2020   CREATININE 2.13 (H) 07/28/2020   CREATININE 3.30 (H) 05/10/2020   CREATININE 3.56 (H) 04/05/2020   CALCIUM 8.4 (L) 07/28/2020   CALCIUM 7.8 (L) 04/05/2020   CALCIUM 8.0 (L) 04/04/2020   LFT No results for input(s): PROT, ALBUMIN, AST, ALT, ALKPHOS, BILITOT, BILIDIR, IBILI in the last 72 hours. PT/INR Lab Results  Component Value Date   INR 1.6 (H) 03/31/2020   INR 1.5 (H) 02/17/2020   INR 1.9 (H) 01/14/2020   Hepatitis Panel No results for input(s): HEPBSAG, HCVAB, HEPAIGM, HEPBIGM in the last 72 hours. C-Diff No components found for: CDIFF Lipase     Component Value Date/Time   LIPASE 18 09/17/2019 0604    Drugs of Abuse     Component Value Date/Time   LABOPIA NONE DETECTED 07/31/2019 1145   COCAINSCRNUR NONE DETECTED 07/31/2019 1145   LABBENZ NONE DETECTED 07/31/2019 1145   AMPHETMU NONE DETECTED 07/31/2019 1145   THCU NONE DETECTED 07/31/2019 1145   LABBARB NONE DETECTED 07/31/2019 1145     RADIOLOGY STUDIES: MR BRAIN WO CONTRAST  Result Date: 07/29/2020 CLINICAL DATA:  Initial evaluation for recurrent syncope. EXAM: MRI HEAD WITHOUT CONTRAST TECHNIQUE: Multiplanar, multiecho pulse sequences of the brain and surrounding structures were obtained without intravenous contrast. COMPARISON:  Prior MRI from 02/17/2020. FINDINGS: Brain: Diffuse prominence of the CSF containing spaces compatible with moderately advanced age-related cerebral atrophy. Extensive patchy and confluent T2/FLAIR  hyperintensity within the periventricular and deep white matter both cerebral hemispheres most consistent with chronic small vessel ischemic disease, fairly advanced in nature. Few scatter remote lacunar infarcts noted about the left basal ganglia and hemispheric cerebral white matter. 4 mm focus of restricted diffusion seen involving the ventral medial left thalamus, consistent with an acute ischemic infarct (series 5, image 72). No associated hemorrhage or mass effect. No other evidence for acute or subacute ischemia. Gray-white matter differentiation otherwise maintained. No encephalomalacia to suggest chronic cortical infarction. No acute intracranial hemorrhage. Multiple scattered chronic micro hemorrhages seen throughout both cerebral hemispheres, likely hypertensive in nature. No mass lesion, midline shift or mass effect. No hydrocephalus or extra-axial fluid collection. Pituitary gland suprasellar region normal. Midline structures intact. Vascular: Major intracranial vascular flow voids are maintained. Skull and upper cervical  spine: Craniocervical junction within normal limits. Degenerative spondylosis noted at C3-4 with resultant mild spinal stenosis. Bone marrow signal intensity within normal limits. No scalp soft tissue abnormality. Sinuses/Orbits: Patient status post bilateral ocular lens replacement. Globes and orbital soft tissues demonstrate no acute finding. Paranasal sinuses are largely clear. No significant mastoid effusion. Inner ear structures grossly normal. Other: None. IMPRESSION: 1. 4 mm acute ischemic nonhemorrhagic left thalamic infarct. 2. Moderately age-related cerebral atrophy with advanced chronic microvascular ischemic disease. 3. Multiple chronic micro hemorrhages scattered throughout both cerebral hemispheres, favored to be related to chronic poorly controlled hypertension. Electronically Signed   By: Jeannine Boga M.D.   On: 07/29/2020 03:26   DG Chest Port 1  View  Result Date: 07/28/2020 CLINICAL DATA:  Syncopal episode following dialysis therapy EXAM: PORTABLE CHEST 1 VIEW COMPARISON:  02/17/2020 FINDINGS: Cardiac shadows within normal limits. Aortic calcifications are seen. Previously seen dialysis catheter has been removed in the interval. The lungs are clear. No bony abnormality is noted. IMPRESSION: No active disease. Electronically Signed   By: Inez Catalina M.D.   On: 07/28/2020 17:09   DG Foot Complete Right  Result Date: 07/28/2020 CLINICAL DATA:  NECROSIS OF 1ST AND 2ND TOE PER ORDER / PATIENT STATES HE IS DIABETICthe EXAM: RIGHT FOOT COMPLETE - 3+ VIEW COMPARISON:  None. FINDINGS: No osseous erosion associated with the first or second toe. No soft tissue gas or ulceration identified. Extensive arterial vascular calcifications noted. IMPRESSION: No evidence of osteomyelitis. Electronically Signed   By: Suzy Bouchard M.D.   On: 07/28/2020 18:31   VAS [place] ABI WITH/WO TBI  Result Date: 07/29/2020 LOWER EXTREMITY DOPPLER STUDY Indications: Ulceration, and peripheral artery disease. High Risk         Hypertension, hyperlipidemia, Diabetes, past history of Factors:          smoking.  Vascular Interventions: AVF Right arm. Limitations: Today's exam was limited due to AVF RT Lt AKA. Comparison Study: Prev Performing Technologist: Vonzell Schlatter RVT  Examination Guidelines: A complete evaluation includes at minimum, Doppler waveform signals and systolic blood pressure reading at the level of bilateral brachial, anterior tibial, and posterior tibial arteries, when vessel segments are accessible. Bilateral testing is considered an integral part of a complete examination. Photoelectric Plethysmograph (PPG) waveforms and toe systolic pressure readings are included as required and additional duplex testing as needed. Limited examinations for reoccurring indications may be performed as noted.  ABI Findings: +--------+------------------+-----+----------+--------+  Right   Rt Pressure (mmHg)IndexWaveform  Comment  +--------+------------------+-----+----------+--------+ Brachial                                 AVF      +--------+------------------+-----+----------+--------+ PTA     216               1.52 biphasic           +--------+------------------+-----+----------+--------+ DP      226               1.59 monophasic         +--------+------------------+-----+----------+--------+ +--------+------------------+-----+---------+-------+ Left    Lt Pressure (mmHg)IndexWaveform Comment +--------+------------------+-----+---------+-------+ [id number]                    triphasic        +--------+------------------+-----+---------+-------+ +-------+-----------+-----------+------------+------------+ ABI/TBIToday's ABIToday's TBIPrevious ABIPrevious TBI +-------+-----------+-----------+------------+------------+ Right  1.5  1.2                      +-------+-----------+-----------+------------+------------+ Arterial wall calcification precludes accurate ankle pressures and ABIs.  Summary: Right: Resting right ankle-brachial index indicates noncompressible right lower extremity arteries. ABIs are unreliable.  *See table(s) above for measurements and observations.    Preliminary    VAS [place] LOWER EXTREMITY ARTERIAL DUPLEX  Result Date: 07/29/2020 LOWER EXTREMITY ARTERIAL DUPLEX STUDY Indications: Ulceration, and peripheral artery disease. High Risk Factors: Hypertension, hyperlipidemia, Diabetes, past history of                    smoking.  Vascular Interventions: AVF RT                         Left AKA. Current ABI:            1.5 Comparison Study: 03/30/20 Performing Technologist: Vonzell Schlatter RVT  Examination Guidelines: A complete evaluation includes B-mode imaging, spectral Doppler, color Doppler, and power Doppler as needed of all accessible portions of each vessel. Bilateral testing is considered an integral part of  a complete examination. Limited examinations for reoccurring indications may be performed as noted.  +-----------+--------+-----+--------+--------+---------+ RIGHT      PSV cm/sRatioStenosisWaveformComments  +-----------+--------+-----+--------+--------+---------+ EIA Distal 55                                     +-----------+--------+-----+--------+--------+---------+ CFA Mid    62                                     +-----------+--------+-----+--------+--------+---------+ DFA        52                                     +-----------+--------+-----+--------+--------+---------+ SFA Prox   78                           shadowing +-----------+--------+-----+--------+--------+---------+ SFA Mid    76                           shadowing +-----------+--------+-----+--------+--------+---------+ SFA Distal 38                           shadowing +-----------+--------+-----+--------+--------+---------+ POP Mid    57                           shadowing +-----------+--------+-----+--------+--------+---------+ ATA Distal 127                                    +-----------+--------+-----+--------+--------+---------+ PTA Distal 15                                     +-----------+--------+-----+--------+--------+---------+ PERO Distal34                                     +-----------+--------+-----+--------+--------+---------+  DP         43                                     +-----------+--------+-----+--------+--------+---------+  Summary: Right: ABIs are unreliable.  See table(s) above for measurements and observations.    Preliminary      IMPRESSION:   *    Acute on chronic anemia.  S/p 1 PRBC overnight.  Received PRBCs in light and September 2021. Dark brown, FOBT positive stool. Worse may be gastric AVMs which were seen but not bleeding nor treated EGD in 2019. H Pylori gastritis (Rx 2 weeks Pylera) and benign gastric polyp at 2019  EGD, no PPI/H2B at home.   Adenomatous colon polyps in 2018 and 2019.  *    Recurrent CVA, on DAPT for history CVA as well as PVD.  No gross hemiparesis on exam. Plavix/ASA not on hold.  *    05/2020 lower extremity angiography showing diffuse tibial disease, multiple areas of > 80% stenosis.  Dr. Trula Slade able to perform balloon angioplasty from ankle up to origin of posterior tibial artery, unable to get balloon to cross the ankle.  *    ESRD.  On hemodialysis TTS.  *    Glucose in the 400s.  Home med list does not include any insulin or other diabetic medications.  Now on sliding scale insulin.    PLAN:     *    Received 1 dose of IV Plavix.  Will order Plavix 40 mg po/day.    *    Upper endoscopy?   Azucena Freed  07/29/2020, 1:14 PM Phone [phone]    I have reviewed the entire case in detail with the above APP and discussed the plan in detail.  Therefore, I agree with the diagnoses recorded above. In addition,  I have personally interviewed and examined the patient.  My additional thoughts are as follows:  Medically complex patient with acute on chronic anemia, heme positive stool in the setting of DAPT for severe peripheral arterial disease with balloon angioplasty right peripheral vessel in November. End-stage renal disease on hemodialysis. He appears to have suffered another CVA in the setting of postdialysis hypotension and he has severe diffuse small vessel white matter disease as well.  His hemoglobin is reportedly down at least 3 g from a relatively recent value at dialysis. He has chronic anemia from renal disease and also an apparent element of chronic occult GI blood loss.  Given that and the propensity for dialysis patients on aspirin to develop peptic ulcer, an upper endoscopy is warranted during this admission.  However, I do not think he is yet ready for that with this acute CVA, persistent hyperglycemia, and ongoing ischemia of the distal right extremity that  is being investigated.  It has been felt that his aspirin and Plavix should remain on interrupted, which is fine with me as long as he does not show signs of overt GI bleeding.  He has not had any melena or hematochezia witnessed since admission.  If that should occur, then his Plavix should be held and endoscopy likely warranted more urgently.  In the meantime, we have increased his Protonix to twice daily and allowed him a regular diet.  We will follow him closely with you during his hospital stay,   Catherine

## 2020-07-29 NOTE — ED Notes (Signed)
Breakfast Ordered 

## 2020-07-29 NOTE — Consult Note (Signed)
[facility] Renal Consultation Note    Indication for Consultation:  Management of ESRD/hemodialysis; anemia, hypertension/volume and secondary hyperparathyroidism  NLG:XQJJHERDE, Rylee, MD  HPI: Dennis Macias is a 70 y.o. male. ESRD on HD TTS at [facility].  Past medical history significant for DM, HTN, Hx CVA, ischemic CMP (EF 35-50%, Y8XK), chronic systolic and diastolic HF, PVD s/p L BKA, and dementia.  Patient is compliant with prescribed dialysis regimen.   Patient seen and examined at bedside.  Sent to the hospital via EMS from dialysis due to syncopal episode.  Reports he "went out" at dialysis and woke up in the hospital.  Review of outpatient notes shows he brief episode of unresponsiveness during treatment improved to baseline after getting fluids.  Post treatment episode of LOC ni the lobby with loss of bladder control and unresponsiveness, "gasping and coughing, BP low 88/46" and altered mental status.  Currently denies HA, CP, dizziness, weakness, fatigue, SOB, abdominal pain, n/v/d, melena and hematochezia.    Pertinent findings since admission include Hgb 7.4, +FOBT, CXR with no acute findings, xray foot with no signs osteomyelitis, MRI showed 4 mm acute ischemic nonhemorrhagic left thalamic infarct and chronic changes.  Patient has been admitted for further evaluation and management.     Past Medical History:  Diagnosis Date  . Anemia   . Arthritis    past hx   . Blindness    right eye r/t diabetes per wife [name]  . Cardiorenal syndrome   . Cataract    removed both eyes  . CHF (congestive heart failure) (HCC)    hx  . CKD (chronic kidney disease)    Dialysis T Th Sat  . Dehydration   . Diabetes ([place])    type 2 - diet controlled, no meds  . Glaucoma   . History of CVA (cerebrovascular accident) 09/13/2015  . History of stroke 09/13/2015  . History of urinary retention   . HOH (hard of hearing)    no hearing aids  . Hyperlipidemia   .  Hypertension   . NSTEMI (non-ST elevated myocardial infarction) ([place])   . Peripheral vascular disease ([place])   . Pernicious anemia 02/24/2018  . S/P TURP   . Stroke [facility])    2017- March, no deficit   . Syncope 11/2019  . Tachycardia 08/26/2017  . Tubular adenoma of colon 02/2017  . Walker as ambulation aid    and occasional uses cane  . Weight loss, non-intentional 08/26/2017   10 lbs between 6/18 & 2/19   Past Surgical History:  Procedure Laterality Date  . ABDOMINAL AORTOGRAM W/LOWER EXTREMITY N/A 02/09/2020   Procedure: ABDOMINAL AORTOGRAM W/LOWER EXTREMITY;  Surgeon: Serafina Mitchell, MD;  Location: [place] [facility];  Service: Cardiovascular;  Laterality: N/A;  . ABDOMINAL AORTOGRAM W/LOWER EXTREMITY N/A 05/10/2020   Procedure: ABDOMINAL AORTOGRAM W/LOWER EXTREMITY;  Surgeon: Serafina Mitchell, MD;  Location: [place] [facility];  Service: Cardiovascular;  Laterality: N/A;  . AMPUTATION Left 03/31/2020   Procedure: LEFT BELOW KNEE AMPUTATION;  Surgeon: Serafina Mitchell, MD;  Location: [facility];  Service: Vascular;  Laterality: Left;  MAC anesthesia with regional block   WILL NEED DIALYSIS THURSDAY  . AV FISTULA PLACEMENT Right 01/19/2020   Procedure: BRACHIOCEPHALIC ARTERIOVENOUS (AV) FISTULA CREATION;  Surgeon: Waynetta Sandy, MD;  Location: [place];  Service: Vascular;  Laterality: Right;  . CATARACT EXTRACTION, BILATERAL    . COLONOSCOPY    . IR FLUORO GUIDE CV LINE RIGHT  01/14/2020  . IR THORACENTESIS ASP PLEURAL SPACE W/IMG GUIDE  09/21/2019  . IR THORACENTESIS ASP PLEURAL SPACE W/IMG GUIDE  10/16/2019  . IR US GUIDE VASC ACCESS RIGHT  01/15/2020  . LEFT HEART CATH AND CORONARY ANGIOGRAPHY N/A 11/19/2019   Procedure: LEFT HEART CATH AND CORONARY ANGIOGRAPHY;  Surgeon: Jettie Booze, MD;  Location: [place] [facility];  Service: Cardiovascular;  Laterality: N/A;  . PERIPHERAL VASCULAR BALLOON ANGIOPLASTY Right 05/10/2020   Procedure: PERIPHERAL VASCULAR BALLOON ANGIOPLASTY;   Surgeon: Serafina Mitchell, MD;  Location: [place] [facility];  Service: Cardiovascular;  Laterality: Right;  PT  . PERIPHERAL VASCULAR INTERVENTION Left 02/09/2020   Procedure: PERIPHERAL VASCULAR INTERVENTION;  Surgeon: Serafina Mitchell, MD;  Location: [place] [facility];  Service: Cardiovascular;  Laterality: Left;  . POLYPECTOMY    . REFRACTIVE SURGERY  10/2017  . TEE WITHOUT CARDIOVERSION N/A 09/14/2015   Procedure: TRANSESOPHAGEAL ECHOCARDIOGRAM (TEE);  Surgeon: Larey Dresser, MD;  Location: [place];  Service: Cardiovascular;  Laterality: N/A;  . TRANSURETHRAL RESECTION OF PROSTATE N/A 10/20/2019   Procedure: TRANSURETHRAL RESECTION OF THE PROSTATE (TURP);  Surgeon: Irine Seal, MD;  Location: [facility];  Service: Urology;  Laterality: N/A;  . UPPER GASTROINTESTINAL ENDOSCOPY     Family History  Problem Relation Age of Onset  . Hypertension Mother   . Hyperlipidemia Mother   . Hyperlipidemia Father   . Colon cancer Neg Hx   . Colon polyps Neg Hx   . Esophageal cancer Neg Hx   . Rectal cancer Neg Hx   . Stomach cancer Neg Hx    Social History:  reports that he has quit smoking. His smoking use included cigarettes. He quit smokeless tobacco use about 42 years ago.  His smokeless tobacco use included chew. He reports previous alcohol use of about 1.0 standard drink of alcohol per week. He reports that he does not use drugs. Allergies  Allergen Reactions  . Cefepime Other (See Comments)    Pt had BAD encephalopathy from Cefepime   Prior to Admission medications   Medication Sig Start Date End Date Taking? Authorizing Provider  Accu-Chek FastClix Lancets MISC Check blood sugar up to 7 times a week as instructed 12/03/19  Yes Angiulli, Lavon Paganini, PA-C  acetaminophen (TYLENOL) 500 MG tablet Take 500 mg by mouth every 6 (six) hours as needed for moderate pain or headache.   Yes [provider]  aspirin EC 81 MG tablet Take 1 tablet (81 mg total) by mouth daily. 10/22/19  Yes  Eugenie Filler, MD  atorvastatin (LIPITOR) 80 MG tablet Take 1 tablet (80 mg total) by mouth at bedtime. IM program 02/29/20  Yes Madalyn Rob, MD  Blood Glucose Monitoring Suppl (ACCU-CHEK GUIDE) w/Device KIT 1 each by Does not apply route daily. Check blood sugar as instructed up to 7 times a week 12/03/19  Yes Angiulli, Lavon Paganini, PA-C  clopidogrel (PLAVIX) 75 MG tablet Take 1 tablet (75 mg total) by mouth daily. 05/10/20  Yes Serafina Mitchell, MD  dorzolamide-timolol (COSOPT) 22.3-6.8 MG/ML ophthalmic solution Place 1 drop into the right eye 2 (two) times daily. 12/03/19  Yes Angiulli, Lavon Paganini, PA-C  finasteride (PROSCAR) 5 MG tablet Take 1 tablet (5 mg total) by mouth daily. 02/29/20  Yes Madalyn Rob, MD  glucose blood (ACCU-CHEK GUIDE) test strip Check blood sugar up to 7 times a week as instructed 12/03/19  Yes Angiulli, Lavon Paganini, PA-C  lidocaine-prilocaine (EMLA) cream Apply 1 application topically  daily as needed (prior to fistula use).   Yes [provider]  polyethylene glycol (MIRALAX / GLYCOLAX) 17 g packet Take 17 g by mouth daily as needed for moderate constipation.    Yes [provider]  sevelamer carbonate (RENVELA) 800 MG tablet Take 800 mg by mouth See admin instructions. Take 800 mg by mouth three times a day with food on Sun/Mon/Wed/Fri and two times a day on Tues/Thurs/Sat   Yes [provider]  vitamin B-12 (CYANOCOBALAMIN) 1000 MCG tablet Take 1 tablet (1,000 mcg total) by mouth daily. 12/03/19  Yes Angiulli, Lavon Paganini, PA-C   Current Facility-Administered Medications  Medication Dose Route Frequency Provider Last Rate Last Admin  . 0.9 %  sodium chloride infusion (Manually program via Guardrails IV Fluids)   Intravenous Once Blanchie Dessert, MD      . acetaminophen (TYLENOL) tablet 650 mg  650 mg Oral Q6H PRN Jose Persia, MD       Or  . acetaminophen (TYLENOL) suppository 650 mg  650 mg Rectal Q6H PRN Jose Persia, MD      . aspirin EC  tablet 81 mg  81 mg Oral Daily Jose Persia, MD   81 mg at 07/29/20 1241  . atorvastatin (LIPITOR) tablet 80 mg  80 mg Oral QHS Jose Persia, MD   80 mg at 07/29/20 0207  . Chlorhexidine Gluconate Cloth 2 % PADS 6 each  6 each Topical Daily Lucious Groves, DO      . clopidogrel (PLAVIX) tablet 75 mg  75 mg Oral Daily Virl Axe, MD   75 mg at 07/29/20 1241  . dorzolamide-timolol (COSOPT) 22.3-6.8 MG/ML ophthalmic solution 1 drop  1 drop Right Eye BID Jose Persia, MD   1 drop at 07/29/20 1244  . finasteride (PROSCAR) tablet 5 mg  5 mg Oral Daily Jose Persia, MD   5 mg at 07/29/20 1241  . insulin aspart (novoLOG) injection 0-6 Units  0-6 Units Subcutaneous TID WC Jose Persia, MD   2 Units at 07/29/20 1242  . pantoprazole (PROTONIX) EC tablet 40 mg  40 mg Oral BID AC Danis, Estill Cotta III, MD      . sevelamer carbonate (RENVELA) tablet 800 mg  800 mg Oral 3 times per day on Sun Mon Wed Fri Angelica Pou, MD   800 mg at 07/29/20 1241   And  . [START ON 07/30/2020] sevelamer carbonate (RENVELA) tablet 800 mg  800 mg Oral 2 times per day on Tue Thu Sat Angelica Pou, MD      . sodium chloride flush (NS) 0.9 % injection 3 mL  3 mL Intravenous Q12H Jose Persia, MD   3 mL at 07/29/20 1249   Labs: Basic Metabolic Panel: Recent Labs  Lab 07/28/20 1750  NA 137  K 3.7  CL 96*  CO2 28  GLUCOSE 406*  BUN 23  CREATININE 2.13*  CALCIUM 8.4*   CBC: Recent Labs  Lab 07/28/20 1750 07/29/20 1455  WBC 9.5  --   NEUTROABS 7.3  --   HGB 7.4* 7.6*  HCT 22.8* 21.9*  MCV 90.1  --   PLT 206  --    CBG: Recent Labs  Lab 07/28/20 2136 07/29/20 0155 07/29/20 0836 07/29/20 1229  GLUCAP 303* 240* 220* 236*   Studies/Results: MR BRAIN WO CONTRAST  Result Date: 07/29/2020 CLINICAL DATA:  Initial evaluation for recurrent syncope. EXAM: MRI HEAD WITHOUT CONTRAST TECHNIQUE: Multiplanar, multiecho pulse sequences of the brain and surrounding structures were  obtained  without intravenous contrast. COMPARISON:  Prior MRI from 02/17/2020. FINDINGS: Brain: Diffuse prominence of the CSF containing spaces compatible with moderately advanced age-related cerebral atrophy. Extensive patchy and confluent T2/FLAIR hyperintensity within the periventricular and deep white matter both cerebral hemispheres most consistent with chronic small vessel ischemic disease, fairly advanced in nature. Few scatter remote lacunar infarcts noted about the left basal ganglia and hemispheric cerebral white matter. 4 mm focus of restricted diffusion seen involving the ventral medial left thalamus, consistent with an acute ischemic infarct (series 5, image 72). No associated hemorrhage or mass effect. No other evidence for acute or subacute ischemia. Gray-white matter differentiation otherwise maintained. No encephalomalacia to suggest chronic cortical infarction. No acute intracranial hemorrhage. Multiple scattered chronic micro hemorrhages seen throughout both cerebral hemispheres, likely hypertensive in nature. No mass lesion, midline shift or mass effect. No hydrocephalus or extra-axial fluid collection. Pituitary gland suprasellar region normal. Midline structures intact. Vascular: Major intracranial vascular flow voids are maintained. Skull and upper cervical spine: Craniocervical junction within normal limits. Degenerative spondylosis noted at C3-4 with resultant mild spinal stenosis. Bone marrow signal intensity within normal limits. No scalp soft tissue abnormality. Sinuses/Orbits: Patient status post bilateral ocular lens replacement. Globes and orbital soft tissues demonstrate no acute finding. Paranasal sinuses are largely clear. No significant mastoid effusion. Inner ear structures grossly normal. Other: None. IMPRESSION: 1. 4 mm acute ischemic nonhemorrhagic left thalamic infarct. 2. Moderately age-related cerebral atrophy with advanced chronic microvascular ischemic disease. 3. Multiple chronic  micro hemorrhages scattered throughout both cerebral hemispheres, favored to be related to chronic poorly controlled hypertension. Electronically Signed   By: Jeannine Boga M.D.   On: 07/29/2020 03:26   DG Chest Port 1 View  Result Date: 07/28/2020 CLINICAL DATA:  Syncopal episode following dialysis therapy EXAM: PORTABLE CHEST 1 VIEW COMPARISON:  02/17/2020 FINDINGS: Cardiac shadows within normal limits. Aortic calcifications are seen. Previously seen dialysis catheter has been removed in the interval. The lungs are clear. No bony abnormality is noted. IMPRESSION: No active disease. Electronically Signed   By: Inez Catalina M.D.   On: 07/28/2020 17:09   DG Foot Complete Right  Result Date: 07/28/2020 CLINICAL DATA:  NECROSIS OF 1ST AND 2ND TOE PER ORDER / PATIENT STATES HE IS DIABETICthe EXAM: RIGHT FOOT COMPLETE - 3+ VIEW COMPARISON:  None. FINDINGS: No osseous erosion associated with the first or second toe. No soft tissue gas or ulceration identified. Extensive arterial vascular calcifications noted. IMPRESSION: No evidence of osteomyelitis. Electronically Signed   By: Suzy Bouchard M.D.   On: 07/28/2020 18:31   VAS [place] ABI WITH/WO TBI  Result Date: 07/29/2020 LOWER EXTREMITY DOPPLER STUDY Indications: Ulceration, and peripheral artery disease. High Risk         Hypertension, hyperlipidemia, Diabetes, past history of Factors:          smoking.  Vascular Interventions: AVF Right arm. Limitations: Today's exam was limited due to AVF RT Lt AKA. Comparison Study: Prev Performing Technologist: Vonzell Schlatter RVT  Examination Guidelines: A complete evaluation includes at minimum, Doppler waveform signals and systolic blood pressure reading at the level of bilateral brachial, anterior tibial, and posterior tibial arteries, when vessel segments are accessible. Bilateral testing is considered an integral part of a complete examination. Photoelectric Plethysmograph (PPG) waveforms and toe systolic  pressure readings are included as required and additional duplex testing as needed. Limited examinations for reoccurring indications may be performed as noted.  ABI Findings: +--------+------------------+-----+----------+--------+ Right   Rt Pressure (mmHg)IndexWaveform  Comment  +--------+------------------+-----+----------+--------+ Brachial                                 AVF      +--------+------------------+-----+----------+--------+ PTA     216               1.52 biphasic           +--------+------------------+-----+----------+--------+ DP      226               1.59 monophasic         +--------+------------------+-----+----------+--------+ +--------+------------------+-----+---------+-------+ Left    Lt Pressure (mmHg)IndexWaveform Comment +--------+------------------+-----+---------+-------+ [id number]                    triphasic        +--------+------------------+-----+---------+-------+ +-------+-----------+-----------+------------+------------+ ABI/TBIToday's ABIToday's TBIPrevious ABIPrevious TBI +-------+-----------+-----------+------------+------------+ Right  1.5                   1.2                      +-------+-----------+-----------+------------+------------+ Arterial wall calcification precludes accurate ankle pressures and ABIs.  Summary: Right: Resting right ankle-brachial index indicates noncompressible right lower extremity arteries. ABIs are unreliable.  *See table(s) above for measurements and observations.  Electronically signed by Deitra Mayo MD on 07/29/2020 at 3:10:15 PM.   Final    VAS [place] LOWER EXTREMITY ARTERIAL DUPLEX  Result Date: 07/29/2020 LOWER EXTREMITY ARTERIAL DUPLEX STUDY Indications: Ulceration, and peripheral artery disease. High Risk Factors: Hypertension, hyperlipidemia, Diabetes, past history of                    smoking.  Vascular Interventions: AVF RT                         Left AKA. Current ABI:             1.5 Comparison Study: 03/30/20 Performing Technologist: Vonzell Schlatter RVT  Examination Guidelines: A complete evaluation includes B-mode imaging, spectral Doppler, color Doppler, and power Doppler as needed of all accessible portions of each vessel. Bilateral testing is considered an integral part of a complete examination. Limited examinations for reoccurring indications may be performed as noted.  +-----------+--------+-----+--------+--------+---------+ RIGHT      PSV cm/sRatioStenosisWaveformComments  +-----------+--------+-----+--------+--------+---------+ EIA Distal 55                                     +-----------+--------+-----+--------+--------+---------+ CFA Mid    62                                     +-----------+--------+-----+--------+--------+---------+ DFA        52                                     +-----------+--------+-----+--------+--------+---------+ SFA Prox   78                           shadowing +-----------+--------+-----+--------+--------+---------+ SFA Mid    76  shadowing +-----------+--------+-----+--------+--------+---------+ SFA Distal 38                           shadowing +-----------+--------+-----+--------+--------+---------+ POP Mid    57                           shadowing +-----------+--------+-----+--------+--------+---------+ ATA Distal 127                                    +-----------+--------+-----+--------+--------+---------+ PTA Distal 15                                     +-----------+--------+-----+--------+--------+---------+ PERO Distal34                                     +-----------+--------+-----+--------+--------+---------+ DP         43                                     +-----------+--------+-----+--------+--------+---------+  Summary: Right: ABIs are unreliable.  See table(s) above for measurements and observations. Electronically signed by  Deitra Mayo MD on 07/29/2020 at 3:10:41 PM.    Final     ROS: All others negative except those listed in HPI.  Physical Exam: Vitals:   07/29/20 0930 07/29/20 0945 07/29/20 1000 07/29/20 1204  BP: 137/65 (!) 138/103 (!) 141/70 140/74  Pulse:    68  Resp: _0 Temp: 98.2 F (36.8 C)   98 F (36.7 C)  TempSrc: Oral   Tympanic  SpO2:    100%  Weight:    69.8 kg  Height:    6' (1.829 m)     General: well appearing male in NAD Head: NCAT sclera not icteric, R eye hazy, MMM Neck: Supple. No lymphadenopathy. No JVD Lungs: CTA bilaterally. No wheeze, rales or rhonchi. Breathing is unlabored. Heart: RRR. No murmur, rubs or gallops.  Abdomen: soft, nontender, +BS, no guarding, no rebound tenderness M/S:  Equal strength b/l in upper and lower extremities.  Lower extremities:no edema b/l, L BKA  Neuro: AAOx3. Moves all extremities spontaneously. Psych: flat affect Dialysis Access:  RU AVF +b  Dialysis Orders:  TTS - [facility]  4hrs, BFR 400, DFR 800,  EDW 69kg, 2K/ 2.25Ca  Access: RU AVF  Heparin 4000 unit bolus Hectorol 23mg IV qHD   Assessment/Plan: 1.  Syncopal episode - likely multifactorial - acute CVA, hypotension, and acute on chronic anemia 2. Acute CVA - seen on MRI. Thought to be related to cerebral hypoperfusion with hypotension post HD, as this was the cause of a past stroke. Per neuro/Admit 3. R 1st & 2nd digit ulcerations/Hx PVD - RLE duplex/ABI unreliable due to non compressible arteries.  Plan for RLE angiogram if still admitted on Monday.  4.  ESRD -  On HD TTS.  Plan for HD on Sunday due to recent CVA.  Truncated treatment due to high patient volume and staffing issues. Hold heparin.  5.  Hypertension/volume  - BP well controlled.  Does not appear volume overloaded.  UF as tolerated.  Keep SBP>100. 6.  Acute on chronic  Anemia of CKD - +FOBT, Hgb drop 11.3 (on 1/13 as OP) to 7.4, s/p 1 unit pRBC overnight. Hgb 7.6 today.  GI consulted.  Planning to  follow for now, increase protonix.  May have EGD if starts having signs active bleeding.  7.  Secondary Hyperparathyroidism -  Ca in goal. Will check phos. Continue VDRA and binders.  8.  Nutrition - Renal diet w/fluid restrictions.  9. DMT2  Jen Mow, PA-C [facility] 07/29/2020, 3:23 PM

## 2020-07-29 NOTE — Progress Notes (Signed)
Interval Progress Note:  MRI results show evidence of acute 4 mm intracranial infarct of the left thalamus. Previous stroke was suspected to be secondary to cerebral hypoperfusion with hypotension post-dialysis. This is likely the etiology of current CVA. Patient is already on DAPT and will continue at this time.   - Continue Aspirin and Plavix  - Consider Neurology consult in the AM - Will hold off on TTE since patient recently had one with stroke evaluation in August 2021 - Will switch from Med-surg to Telemetry floor   Dr. Jose Persia Internal Medicine PGY-2  Pager: [phone] After 5pm on weekdays and 1pm on weekends: On Call pager [phone]  07/29/2020, 4:01 AM

## 2020-07-29 NOTE — ED Notes (Signed)
Attempted report 

## 2020-07-29 NOTE — ED Notes (Signed)
Lunch Tray Ordered @ 1046. 

## 2020-07-29 NOTE — Progress Notes (Signed)
HD#0 Subjective:   Admitted overnight.  Evaluated at bedside during rounds. He states he does not remember much of what occurred yesterday. States he was told he had another stroke. Updated patient on situation, that he had an episode of passing out and that an MRI was done and a new stroke was seen. Also informed him his blood counts were lower and blood was detected in his stool. He denies previously seeing blood in his stool or bleeding elsewhere. Denies lightheadedness, dizziness, chest pain, shortness of breath. Patient gets around mostly in a wheelchair. He is unsure if he has a prior neurological deficit from his stroke.  Objective:   Vital signs in last 24 hours: Vitals:   07/29/20 0600 07/29/20 0637 07/29/20 0642 07/29/20 0656  BP: 127/79  131/65 130/62  Pulse: 76  76 68  Resp: 16  16 12   Temp:  98.2 F (36.8 C) 98.2 F (36.8 C) 98.3 F (36.8 C)  TempSrc:  Oral Oral Oral  SpO2: 100%  100% 100%   Supplemental O2: Room Air SpO2: 100 %  Physical Exam Constitutional: chronically ill-appearing man sitting up in ED stretcher, in no acute distress Cardiovascular: regular rate and rhythm, no m/r/g Pulmonary/Chest: normal work of breathing on room air, lungs clear to auscultation bilaterally MSK: slight build, L BKA Neurological: alert & oriented x to person, place, and time; EOMs intact; 5/5 strength in bilateral upper and lower extremities; sensation intact to light touch throughout Skin: RLE cool and dry. Ischemic changes/possible necrosis of right 1st and 2nd digits. Non-blanching erythema on dorsum of right foot stable since admission. Right foot without tenderness to palpation. See media tab for images taken on admission.    Pertinent Labs: CBC Latest Ref Rng & Units 07/28/2020 05/10/2020 05/02/2020  WBC 4.0 - 10.5 K/uL 9.5 - 6.1  Hemoglobin 13.0 - 17.0 g/dL 7.4(L) 10.9(L) 11.1(L)  Hematocrit 39.0 - 52.0 % 22.8(L) 32.0(L) 35.5(L)  Platelets 150 - 400 K/uL 206 - 221     CMP Latest Ref Rng & Units 07/28/2020 05/10/2020 04/05/2020  Glucose 70 - 99 mg/dL 406(H) 251(H) 173(H)  BUN 8 - 23 mg/dL 23 28(H) 25(H)  Creatinine 0.61 - 1.24 mg/dL 2.13(H) 3.30(H) 3.56(H)  Sodium 135 - 145 mmol/L 137 138 136  Potassium 3.5 - 5.1 mmol/L 3.7 3.6 3.4(L)  Chloride 98 - 111 mmol/L 96(L) 93(L) 100  CO2 22 - 32 mmol/L 28 - 27  Calcium 8.9 - 10.3 mg/dL 8.4(L) - 7.8(L)  Total Protein 6.5 - 8.1 g/dL - - -  Total Bilirubin 0.3 - 1.2 mg/dL - - -  Alkaline Phos 38 - 126 U/L - - -  AST 15 - 41 U/L - - -  ALT 0 - 44 U/L - - -    Imaging: MR BRAIN WO CONTRAST  Result Date: 07/29/2020 CLINICAL DATA:  Initial evaluation for recurrent syncope. EXAM: MRI HEAD WITHOUT CONTRAST TECHNIQUE: Multiplanar, multiecho pulse sequences of the brain and surrounding structures were obtained without intravenous contrast. COMPARISON:  Prior MRI from 02/17/2020. FINDINGS: Brain: Diffuse prominence of the CSF containing spaces compatible with moderately advanced age-related cerebral atrophy. Extensive patchy and confluent T2/FLAIR hyperintensity within the periventricular and deep white matter both cerebral hemispheres most consistent with chronic small vessel ischemic disease, fairly advanced in nature. Few scatter remote lacunar infarcts noted about the left basal ganglia and hemispheric cerebral white matter. 4 mm focus of restricted diffusion seen involving the ventral medial left thalamus, consistent with an acute ischemic infarct (  series 5, image 72). No associated hemorrhage or mass effect. No other evidence for acute or subacute ischemia. Gray-white matter differentiation otherwise maintained. No encephalomalacia to suggest chronic cortical infarction. No acute intracranial hemorrhage. Multiple scattered chronic micro hemorrhages seen throughout both cerebral hemispheres, likely hypertensive in nature. No mass lesion, midline shift or mass effect. No hydrocephalus or extra-axial fluid collection.  Pituitary gland suprasellar region normal. Midline structures intact. Vascular: Major intracranial vascular flow voids are maintained. Skull and upper cervical spine: Craniocervical junction within normal limits. Degenerative spondylosis noted at C3-4 with resultant mild spinal stenosis. Bone marrow signal intensity within normal limits. No scalp soft tissue abnormality. Sinuses/Orbits: Patient status post bilateral ocular lens replacement. Globes and orbital soft tissues demonstrate no acute finding. Paranasal sinuses are largely clear. No significant mastoid effusion. Inner ear structures grossly normal. Other: None. IMPRESSION: 1. 4 mm acute ischemic nonhemorrhagic left thalamic infarct. 2. Moderately age-related cerebral atrophy with advanced chronic microvascular ischemic disease. 3. Multiple chronic micro hemorrhages scattered throughout both cerebral hemispheres, favored to be related to chronic poorly controlled hypertension. Electronically Signed   By: Jeannine Boga M.D.   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 man with history of ESRD on HD TTS, type 2 diabetes mellitus, history of CVA, hypertension, heart failure, and peripheral vascular disease on dual antiplatelet therapy who presented after syncopal episode at dialysis and admitted for further work-up and evaluation.   This is hospital day 0.   Syncopal episode Post-HD hypotension Acute ischemic left thalamic infarct History of multiple prior CVAs (2017, 2021) HTN, HLD Patient has not  had any syncopal events since arrival to the ED. BP have ranged 120s-140s/60s-70s. Neurologic exam stable. Suspect syncopal episode due to decreased cerebral perfusion post-HD given his chronic issues with hypotension following HD. Acute on chronic anemia may be contributing as well (see below). Extensive work-up in the ED also revealed a new acute ischemic left thalamic infarct. He is currently on dual antiplatelet therapy due to his peripheral vascular disease. Will discuss with neurology. - Will discuss with neuro, appreciate their expertise - Continue home ASA and Plavix - Continue atorvastatin 80 mg  - f/u orthostatics  Acute on chronic anemia S/p 1 u pRBC for hemoglobin of 7.4 on admission. Post-transfusion H&H pending. Review of HD records shows hemoglobin of 11.3 on 07/21/20. Patient has not noted any dark or bloody stools at home, but FOBT+ in the ED. Suspicious for acute blood loss. GI consulted in the ED. - GI consulted, appreciate recs - f/u post-transfusion H&H   R 1st and 2nd digit ulcerations History of Peripheral vascular disease s/p L BKA On dual antiplatelet platelet therapy due to PVD. RLE duplex and ABI obtained however unreliable due to noncompressible RLE arteries. Vascular consulted and will plan for angiogram on Monday if patient remains admitted. - RLE angiogram Monday if patient still inpatient  ESRD on HD (TTS) Patient with history of post-HD syncopal episodes. - Nephrology consulted, appreciate recs and coordination of HD  Type 2 diabetes mellitus  Patient has been previously well-controlled off medications. Last A1c 6.4% in 02/2020, likely falsely low in s/o ESRD. CBG 406 on admission, 230s since. - SSI sensitive - Consider long-acting insulin if blood glucoses remain elevated - Carb/renal diet   Diet: Carb/Renal with 1200 mL fluid restriction IVF: None VTE: DAPT Code: Full    Please contact the on call pager after 5 pm and on weekends at  [phone].  [name], MD PGY-1 Internal Medicine Teaching Service Pager: [phone] 07/29/2020

## 2020-07-29 NOTE — ED Notes (Signed)
Paged IM to RN Danae Chen

## 2020-07-29 NOTE — Progress Notes (Signed)
Right leg arterial duplex and ABI completed    Please see CV Proc for preliminary results.   Vonzell Schlatter, RVT

## 2020-07-29 NOTE — ED Notes (Signed)
Pt girlfriend, [name], called this RN for an update. This RN also gave patient his room phone and dialed [name]'s number so they could talk over the phone.

## 2020-07-30 DIAGNOSIS — I739 Peripheral vascular disease, unspecified: Secondary | ICD-10-CM | POA: Diagnosis not present

## 2020-07-30 DIAGNOSIS — R7881 Bacteremia: Secondary | ICD-10-CM | POA: Diagnosis not present

## 2020-07-30 DIAGNOSIS — R195 Other fecal abnormalities: Secondary | ICD-10-CM | POA: Diagnosis not present

## 2020-07-30 DIAGNOSIS — D649 Anemia, unspecified: Secondary | ICD-10-CM | POA: Diagnosis not present

## 2020-07-30 LAB — CBC WITH DIFFERENTIAL/PLATELET
Abs Immature Granulocytes: 0.05 10*3/uL (ref 0.00–0.07)
Basophils Absolute: 0 10*3/uL (ref 0.0–0.1)
Basophils Relative: 0 %
Eosinophils Absolute: 0.1 10*3/uL (ref 0.0–0.5)
Eosinophils Relative: 1 %
HCT: 20.7 % — ABNORMAL LOW (ref 39.0–52.0)
Hemoglobin: 7.2 g/dL — ABNORMAL LOW (ref 13.0–17.0)
Immature Granulocytes: 1 %
Lymphocytes Relative: 29 %
Lymphs Abs: 2.2 10*3/uL (ref 0.7–4.0)
MCH: 31.3 pg (ref 26.0–34.0)
MCHC: 34.8 g/dL (ref 30.0–36.0)
MCV: 90 fL (ref 80.0–100.0)
Monocytes Absolute: 0.6 10*3/uL (ref 0.1–1.0)
Monocytes Relative: 7 %
Neutro Abs: 4.8 10*3/uL (ref 1.7–7.7)
Neutrophils Relative %: 62 %
Platelets: 185 10*3/uL (ref 150–400)
RBC: 2.3 MIL/uL — ABNORMAL LOW (ref 4.22–5.81)
RDW: 14.1 % (ref 11.5–15.5)
WBC: 7.7 10*3/uL (ref 4.0–10.5)
nRBC: 0 % (ref 0.0–0.2)

## 2020-07-30 LAB — RENAL FUNCTION PANEL
Albumin: 2.6 g/dL — ABNORMAL LOW (ref 3.5–5.0)
Anion gap: 12 (ref 5–15)
BUN: 43 mg/dL — ABNORMAL HIGH (ref 8–23)
CO2: 27 mmol/L (ref 22–32)
Calcium: 8.6 mg/dL — ABNORMAL LOW (ref 8.9–10.3)
Chloride: 96 mmol/L — ABNORMAL LOW (ref 98–111)
Creatinine, Ser: 4.3 mg/dL — ABNORMAL HIGH (ref 0.61–1.24)
GFR, Estimated: 14 mL/min — ABNORMAL LOW (ref >60)
Glucose, Bld: 195 mg/dL — ABNORMAL HIGH (ref 70–99)
Phosphorus: 2.9 mg/dL (ref 2.5–4.6)
Potassium: 4.1 mmol/L (ref 3.5–5.1)
Sodium: 135 mmol/L (ref 135–145)

## 2020-07-30 LAB — GLUCOSE, CAPILLARY
Glucose-Capillary: 189 mg/dL — ABNORMAL HIGH (ref 70–99)
Glucose-Capillary: 222 mg/dL — ABNORMAL HIGH (ref 70–99)
Glucose-Capillary: 237 mg/dL — ABNORMAL HIGH (ref 70–99)
Glucose-Capillary: 201 mg/dL — ABNORMAL HIGH (ref 70–99)
Glucose-Capillary: 168 mg/dL — ABNORMAL HIGH (ref 70–99)

## 2020-07-30 LAB — BPAM RBC
Blood Product Expiration Date: 202201252359
Blood Product Expiration Date: 202202062359
ISSUE DATE / TIME: 202201210629
Unit Type and Rh: 7300
Unit Type and Rh: 8400

## 2020-07-30 LAB — CBC
HCT: 25.2 % — ABNORMAL LOW (ref 39.0–52.0)
Hemoglobin: 8.6 g/dL — ABNORMAL LOW (ref 13.0–17.0)
MCH: 30.2 pg (ref 26.0–34.0)
MCHC: 34.1 g/dL (ref 30.0–36.0)
MCV: 88.4 fL (ref 80.0–100.0)
Platelets: 222 10*3/uL (ref 150–400)
RBC: 2.85 MIL/uL — ABNORMAL LOW (ref 4.22–5.81)
RDW: 13.7 % (ref 11.5–15.5)
WBC: 8.7 10*3/uL (ref 4.0–10.5)
nRBC: 0 % (ref 0.0–0.2)

## 2020-07-30 LAB — TYPE AND SCREEN
ABO/RH(D): AB POS
Antibody Screen: NEGATIVE
Unit division: 0
Unit division: 0

## 2020-07-30 LAB — PREPARE RBC (CROSSMATCH)

## 2020-07-30 MED ORDER — SODIUM CHLORIDE 0.9% IV SOLUTION: Freq: Once | INTRAVENOUS | Status: AC

## 2020-07-30 NOTE — Progress Notes (Addendum)
HD#1 Subjective:   Admitted overnight.  Evaluated at bedside this AM. He states he is doing well today. He denies SHOB, headaches, fevers, abdominal pain, or diarrhea. Per denies dark or bloody stools. Questions and concerns addressed at bedside.   Objective:   Vital signs in last 24 hours: Vitals:   07/29/20 1955 07/29/20 2322 07/30/20 0402 07/30/20 0500  BP: 121/65 135/69 135/71   Pulse: 68 69 68   Resp: 20 18 16    Temp: 98.5 F (36.9 C) 99.2 F (37.3 C) 99.6 F (37.6 C)   TempSrc: Oral Oral Oral   SpO2: 100% 100% 98%   Weight:    71.3 kg  Height:       Supplemental O2: Room Air SpO2: 98 %  Physical Exam Constitutional: chronically ill-appearing man laying comfortably in bed.  Cardiovascular: regular rate and rhythm, no m/r/g Pulmonary/Chest: normal work of breathing on room air, lungs clear to auscultation bilaterally MSK:  L BKA no swelling noted Skin: RLE cool and dry. Ischemic changes/possible necrosis of right 1st and 2nd digits. Non-blanching erythema on dorsum of right foot stable since admission. Right foot without tenderness to palpation. See media tab for images taken on admission.    Pertinent Labs: CBC Latest Ref Rng & Units 07/30/2020 07/29/2020 07/29/2020  WBC 4.0 - 10.5 K/uL 7.7 7.5 -  Hemoglobin 13.0 - 17.0 g/dL 7.2(L) 7.3(L) 7.6(L)  Hematocrit 39.0 - 52.0 % 20.7(L) 21.7(L) 21.9(L)  Platelets 150 - 400 K/uL 185 197 -    CMP Latest Ref Rng & Units 07/30/2020 07/28/2020 05/10/2020  Glucose 70 - 99 mg/dL 195(H) 406(H) 251(H)  BUN 8 - 23 mg/dL 43(H) 23 28(H)  Creatinine 0.61 - 1.24 mg/dL 4.30(H) 2.13(H) 3.30(H)  Sodium 135 - 145 mmol/L 135 137 138  Potassium 3.5 - 5.1 mmol/L 4.1 3.7 3.6  Chloride 98 - 111 mmol/L 96(L) 96(L) 93(L)  CO2 22 - 32 mmol/L 27 28 -  Calcium 8.9 - 10.3 mg/dL 8.6(L) 8.4(L) -  Total Protein 6.5 - 8.1 g/dL - - -  Total Bilirubin 0.3 - 1.2 mg/dL - - -  Alkaline Phos 38 - 126 U/L - - -  AST 15 - 41 U/L - - -  ALT 0 - 44 U/L - -  -    Imaging: MR BRAIN WO CONTRAST  Result Date: 07/29/2020 CLINICAL DATA:  Initial evaluation for recurrent syncope. EXAM: MRI HEAD WITHOUT CONTRAST TECHNIQUE: Multiplanar, multiecho pulse sequences of the brain and surrounding structures were obtained without intravenous contrast. COMPARISON:  Prior MRI from 02/17/2020. FINDINGS: Brain: Diffuse prominence of the CSF containing spaces compatible with moderately advanced age-related cerebral atrophy. Extensive patchy and confluent T2/FLAIR hyperintensity within the periventricular and deep white matter both cerebral hemispheres most consistent with chronic small vessel ischemic disease, fairly advanced in nature. Few scatter remote lacunar infarcts noted about the left basal ganglia and hemispheric cerebral white matter. 4 mm focus of restricted diffusion seen involving the ventral medial left thalamus, consistent with an acute ischemic infarct (series 5, image 72). No associated hemorrhage or mass effect. No other evidence for acute or subacute ischemia. Gray-white matter differentiation otherwise maintained. No encephalomalacia to suggest chronic cortical infarction. No acute intracranial hemorrhage. Multiple scattered chronic micro hemorrhages seen throughout both cerebral hemispheres, likely hypertensive in nature. No mass lesion, midline shift or mass effect. No hydrocephalus or extra-axial fluid collection. Pituitary gland suprasellar region normal. Midline structures intact. Vascular: Major intracranial vascular flow voids are maintained. Skull and upper cervical spine:  Craniocervical junction within normal limits. Degenerative spondylosis noted at C3-4 with resultant mild spinal stenosis. Bone marrow signal intensity within normal limits. No scalp soft tissue abnormality. Sinuses/Orbits: Patient status post bilateral ocular lens replacement. Globes and orbital soft tissues demonstrate no acute finding. Paranasal sinuses are largely clear. No  significant mastoid effusion. Inner ear structures grossly normal. Other: None. IMPRESSION: 1. 4 mm acute ischemic nonhemorrhagic left thalamic infarct. 2. Moderately age-related cerebral atrophy with advanced chronic microvascular ischemic disease. 3. Multiple chronic micro hemorrhages scattered throughout both cerebral hemispheres, favored to be related to chronic poorly controlled hypertension. Electronically Signed   By: Jeannine Boga M.D.   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 man with history of ESRD on HD TTS, type 2 diabetes mellitus, history of CVA, hypertension, heart failure, and peripheral vascular disease on dual antiplatelet therapy who presented after syncopal episode at dialysis and admitted for further work-up and evaluation.   This is hospital day 1.  Syncopal episode Post-HD hypotension Acute ischemic left thalamic infarct History of multiple prior CVAs (2017, 2021) HTN, HLD ED workup revealed a new acute ischemic left thalamic infarct. He is currently on dual antiplatelet therapy due to his peripheral vascular disease. - Continue home ASA and Plavix - Continue atorvastatin 80 mg   Acute on chronic anemia S/p 1 u pRBC for hemoglobin of 7.4 on admission. Post-transfusion H&H 7.3 with 7.2 on today's labs. Review of HD records shows hemoglobin of 11.3 on 07/21/20. Patient has not noted any dark or bloody stools at home, but FOBT+ in the ED. He was orthostatic yesterday and could not tolerate standing for 3  minutes. His Hgb appears stable, but maybe likely symptomatic anemia. Will transfuse 1 unit of PRBC.  - Transfuse 1 unit PRBC  - F/U post transfusion H&H - GI consulted and will be following closely during his stay. We appreciate their recommendations.  - If hematochezia or melena, will hold Plavix  - Protonix 40 mg twice daily  R 1st and 2nd digit ulcerations History of Peripheral vascular disease s/p L BKA On dual antiplatelet platelet therapy due to PVD. RLE duplex and ABI obtained however unreliable due to noncompressible RLE arteries. Vascular consulted and will plan for angiogram on Monday if patient remains admitted. - RLE angiogram Monday if patient still inpatient  ESRD on HD (TTS) Nephrology consulted, appreciate recs and coordination of HD  Type 2 diabetes mellitus Patient has been previously well-controlled off medications. Last A1c 6.4% in 02/2020, likely falsely low in s/o ESRD. CBG 406 on admission, 230s since. - Lantus 7 units - SSI sensitive - Consider long-acting insulin if blood glucoses remain elevated - Carb/renal diet   Diet: Carb/Renal with 1200 mL fluid restriction IVF: None VTE: DAPT Code: Full    Please contact the on call pager after 5 pm and on weekends at [phone].  Maudie Mercury, MD PGY-2 Internal Medicine Teaching Service Pager: [phone] 07/30/2020

## 2020-07-30 NOTE — Progress Notes (Signed)
   VASCULAR SURGERY ASSESSMENT & PLAN:   DRY GANGRENE FIRST AND SECOND TOES RIGHT FOOT: This patient underwent angioplasty of the right posterior tibial artery by Dr. Trula Slade on 05/10/2020.  Doppler study yesterday shows a biphasic Doppler signal in the right posterior tibial artery.  The arteries are calcified and the ABIs are not reliable.  The patient was admitted for syncope.  If the patient is discharged then we will arrange follow-up with Dr. Trula Slade as an outpatient to follow the toe wounds.   SUBJECTIVE:   No specific complaints this morning.  PHYSICAL EXAM:   Vitals:   07/29/20 1955 07/29/20 2322 07/30/20 0402 07/30/20 0500  BP: 121/65 135/69 135/71   Pulse: 68 69 68   Resp: 20 18 16    Temp: 98.5 F (36.9 C) 99.2 F (37.3 C) 99.6 F (37.6 C)   TempSrc: Oral Oral Oral   SpO2: 100% 100% 98%   Weight:    71.3 kg  Height:       Dry gangrene on the right first and second toes as shown in the photograph below.      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 sodium chloride   Intravenous Once  . aspirin EC  81 mg Oral Daily  . atorvastatin  80 mg Oral QHS  .  Chlorhexidine Gluconate Cloth  6 each Topical Daily  . clopidogrel  75 mg Oral Daily  . dorzolamide-timolol  1 drop Right Eye BID  . finasteride  5 mg Oral Daily  . insulin aspart  0-6 Units Subcutaneous TID WC  . insulin glargine  7 Units Subcutaneous QHS  . pantoprazole  40 mg Oral BID AC  . sevelamer carbonate  800 mg Oral 3 times per day on Sun Mon Wed Fri   And  . sevelamer carbonate  800 mg Oral 2 times per day on Tue Thu Sat  . sodium chloride flush  3 mL Intravenous Q12H    Deitra Mayo Office: [phone] 07/30/2020

## 2020-07-30 NOTE — Progress Notes (Signed)
Dennis Macias Progress Note   Subjective:  Seen in room - no c/o today, no CP or dyspnea.  Objective Vitals:   07/29/20 2322 07/30/20 0402 07/30/20 0500 07/30/20 0852  BP: 135/69 135/71  (!) 112/55  Pulse: 69 68  74  Resp: 18 16  18   Temp: 99.2 F (37.3 C) 99.6 F (37.6 C)  99 F (37.2 C)  TempSrc: Oral Oral  Oral  SpO2: 100% 98%  100%  Weight:   71.3 kg   Height:       Physical Exam General: Well appearing man, NAD Heart: RRR; no murmur Lungs: CTAB; no rales Abdomen: soft, non-tender Extremities: No LE edema; L BKA Dialysis Access: RUE AVF + bruit  Additional Objective Labs: Basic Metabolic Panel: Recent Labs  Lab 07/28/20 1750 07/30/20 0230  NA 137 135  K 3.7 4.1  CL 96* 96*  CO2 28 27  GLUCOSE 406* 195*  BUN 23 43*  CREATININE 2.13* 4.30*  CALCIUM 8.4* 8.6*  PHOS  --  2.9   Liver Function Tests: Recent Labs  Lab 07/30/20 0230  ALBUMIN 2.6*   CBC: Recent Labs  Lab 07/28/20 1750 07/29/20 1455 07/29/20 1947 07/30/20 0230  WBC 9.5  --  7.5 7.7  NEUTROABS 7.3  --   --  4.8  HGB 7.4* 7.6* 7.3* 7.2*  HCT 22.8* 21.9* 21.7* 20.7*  MCV 90.1  --  89.7 90.0  PLT 206  --  197 185   Studies/Results: MR BRAIN WO CONTRAST  Result Date: 07/29/2020 CLINICAL DATA:  Initial evaluation for recurrent syncope. EXAM: MRI HEAD WITHOUT CONTRAST TECHNIQUE: Multiplanar, multiecho pulse sequences of the brain and surrounding structures were obtained without intravenous contrast. COMPARISON:  Prior MRI from 02/17/2020. FINDINGS: Brain: Diffuse prominence of the CSF containing spaces compatible with moderately advanced age-related cerebral atrophy. Extensive patchy and confluent T2/FLAIR hyperintensity within the periventricular and deep white matter both cerebral hemispheres most consistent with chronic small vessel ischemic disease, fairly advanced in nature. Few scatter remote lacunar infarcts noted about the left basal ganglia and hemispheric cerebral white  matter. 4 mm focus of restricted diffusion seen involving the ventral medial left thalamus, consistent with an acute ischemic infarct (series 5, image 72). No associated hemorrhage or mass effect. No other evidence for acute or subacute ischemia. Gray-white matter differentiation otherwise maintained. No encephalomalacia to suggest chronic cortical infarction. No acute intracranial hemorrhage. Multiple scattered chronic micro hemorrhages seen throughout both cerebral hemispheres, likely hypertensive in nature. No mass lesion, midline shift or mass effect. No hydrocephalus or extra-axial fluid collection. Pituitary gland suprasellar region normal. Midline structures intact. Vascular: Major intracranial vascular flow voids are maintained. Skull and upper cervical spine: Craniocervical junction within normal limits. Degenerative spondylosis noted at C3-4 with resultant mild spinal stenosis. Bone marrow signal intensity within normal limits. No scalp soft tissue abnormality. Sinuses/Orbits: Patient status post bilateral ocular lens replacement. Globes and orbital soft tissues demonstrate no acute finding. Paranasal sinuses are largely clear. No significant mastoid effusion. Inner ear structures grossly normal. Other: None. IMPRESSION: 1. 4 mm acute ischemic nonhemorrhagic left thalamic infarct. 2. Moderately age-related cerebral atrophy with advanced chronic microvascular ischemic disease. 3. Multiple chronic micro hemorrhages scattered throughout both cerebral hemispheres, favored to be related to chronic poorly controlled hypertension. Electronically Signed   By: Jeannine Boga M.D.   On: 07/29/2020 03:26   DG Chest Port 1 View  Result Date: 07/28/2020 CLINICAL DATA:  Syncopal episode following dialysis therapy EXAM: PORTABLE CHEST 1 VIEW COMPARISON:  02/17/2020 FINDINGS: Cardiac shadows within normal limits. Aortic calcifications are seen. Previously seen dialysis catheter has been removed in the interval.  The lungs are clear. No bony abnormality is noted. IMPRESSION: No active disease. Electronically Signed   By: Inez Catalina M.D.   On: 07/28/2020 17:09   DG Foot Complete Right  Result Date: 07/28/2020 CLINICAL DATA:  NECROSIS OF 1ST AND 2ND TOE PER ORDER / PATIENT STATES HE IS DIABETICthe EXAM: RIGHT FOOT COMPLETE - 3+ VIEW COMPARISON:  None. FINDINGS: No osseous erosion associated with the first or second toe. No soft tissue gas or ulceration identified. Extensive arterial vascular calcifications noted. IMPRESSION: No evidence of osteomyelitis. Electronically Signed   By: Suzy Bouchard M.D.   On: 07/28/2020 18:31   VAS [place] ABI WITH/WO TBI  Result Date: 07/29/2020 LOWER EXTREMITY DOPPLER STUDY Indications: Ulceration, and peripheral artery disease. High Risk         Hypertension, hyperlipidemia, Diabetes, past history of Factors:          smoking.  Vascular Interventions: AVF Right arm. Limitations: Today's exam was limited due to AVF RT Lt AKA. Comparison Study: Prev Performing Technologist: Vonzell Schlatter RVT  Examination Guidelines: A complete evaluation includes at minimum, Doppler waveform signals and systolic blood pressure reading at the level of bilateral brachial, anterior tibial, and posterior tibial arteries, when vessel segments are accessible. Bilateral testing is considered an integral part of a complete examination. Photoelectric Plethysmograph (PPG) waveforms and toe systolic pressure readings are included as required and additional duplex testing as needed. Limited examinations for reoccurring indications may be performed as noted.  ABI Findings: +--------+------------------+-----+----------+--------+ Right   Rt Pressure (mmHg)IndexWaveform  Comment  +--------+------------------+-----+----------+--------+ Brachial                                 AVF      +--------+------------------+-----+----------+--------+ PTA     216               1.52 biphasic            +--------+------------------+-----+----------+--------+ DP      226               1.59 monophasic         +--------+------------------+-----+----------+--------+ +--------+------------------+-----+---------+-------+ Left    Lt Pressure (mmHg)IndexWaveform Comment +--------+------------------+-----+---------+-------+ [id number]                    triphasic        +--------+------------------+-----+---------+-------+ +-------+-----------+-----------+------------+------------+ ABI/TBIToday's ABIToday's TBIPrevious ABIPrevious TBI +-------+-----------+-----------+------------+------------+ Right  1.5                   1.2                      +-------+-----------+-----------+------------+------------+ Arterial wall calcification precludes accurate ankle pressures and ABIs.  Summary: Right: Resting right ankle-brachial index indicates noncompressible right lower extremity arteries. ABIs are unreliable.  *See table(s) above for measurements and observations.  Electronically signed by Deitra Mayo MD on 07/29/2020 at 3:10:15 PM.   Final    VAS [place] LOWER EXTREMITY ARTERIAL DUPLEX  Result Date: 07/29/2020 LOWER EXTREMITY ARTERIAL DUPLEX STUDY Indications: Ulceration, and peripheral artery disease. High Risk Factors: Hypertension, hyperlipidemia, Diabetes, past history of                    smoking.  Vascular Interventions: AVF RT  Left AKA. Current ABI:            1.5 Comparison Study: 03/30/20 Performing Technologist: Vonzell Schlatter RVT  Examination Guidelines: A complete evaluation includes B-mode imaging, spectral Doppler, color Doppler, and power Doppler as needed of all accessible portions of each vessel. Bilateral testing is considered an integral part of a complete examination. Limited examinations for reoccurring indications may be performed as noted.  +-----------+--------+-----+--------+--------+---------+ RIGHT      PSV  cm/sRatioStenosisWaveformComments  +-----------+--------+-----+--------+--------+---------+ EIA Distal 55                                     +-----------+--------+-----+--------+--------+---------+ CFA Mid    62                                     +-----------+--------+-----+--------+--------+---------+ DFA        52                                     +-----------+--------+-----+--------+--------+---------+ SFA Prox   78                           shadowing +-----------+--------+-----+--------+--------+---------+ SFA Mid    76                           shadowing +-----------+--------+-----+--------+--------+---------+ SFA Distal 38                           shadowing +-----------+--------+-----+--------+--------+---------+ POP Mid    57                           shadowing +-----------+--------+-----+--------+--------+---------+ ATA Distal 127                                    +-----------+--------+-----+--------+--------+---------+ PTA Distal 15                                     +-----------+--------+-----+--------+--------+---------+ PERO Distal34                                     +-----------+--------+-----+--------+--------+---------+ DP         43                                     +-----------+--------+-----+--------+--------+---------+  Summary: Right: ABIs are unreliable.  See table(s) above for measurements and observations. Electronically signed by Deitra Mayo MD on 07/29/2020 at 3:10:41 PM.    Final    Medications:  . sodium chloride   Intravenous Once  . sodium chloride   Intravenous Once  . aspirin EC  81 mg Oral Daily  . atorvastatin  80 mg Oral QHS  . Chlorhexidine Gluconate Cloth  6 each Topical Daily  . clopidogrel  75 mg Oral Daily  . dorzolamide-timolol  1 drop Right  Eye BID  . finasteride  5 mg Oral Daily  . insulin aspart  0-6 Units Subcutaneous TID WC  . insulin glargine  7 Units  Subcutaneous QHS  . pantoprazole  40 mg Oral BID AC  . sevelamer carbonate  800 mg Oral 3 times per day on Sun Mon Wed Fri   And  . sevelamer carbonate  800 mg Oral 2 times per day on Tue Thu Sat  . sodium chloride flush  3 mL Intravenous Q12H    Dialysis Orders: TTS - [facility]  4hrs, BFR 400, DFR 800,  EDW 69kg, 2K/ 2.25Ca Access: RU AVF  Heparin 4000 unit bolus Hectorol 10mcg IV qHD   Assessment/Plan: 1.  Syncopal episode - likely multifactorial - acute CVA, hypotension, and acute on chronic anemia 2. Acute CVA - seen on MRI. Thought to be related to cerebral hypoperfusion with hypotension post HD, as this was the cause of a past stroke. Per neuro/Admit 3. R 1st & 2nd digit ulcerations/Hx PVD - RLE duplex/ABI unreliable due to non compressible arteries.  Plan for RLE angiogram if still admitted on Monday.  4.  ESRD: Usual TTS schedule. D/t high patient volume and staffing issues, will postpone his next HD - likely to Monday. Volume and labs stable. 5.  Hypertension/volume: BP controlled, no volume on exam. Keep SBP > 100. 6.  Acute on chronic Anemia of CKD: +FOBT, Hgb drop 11.3 (on 1/13 as OP) to 7.4, s/p 1 unit pRBC 1/20. Hgb stable now. GI consulted, following for now on ^ dose PPI. May have EGD if starts having signs active bleeding. Will given ESA with next HD. 7.  Secondary Hyperparathyroidism: Ca/Phos ok. Continue VDRA and binders.  8.  Nutrition - Renal diet w/fluid restrictions.  9.  DMT2   Veneta Penton, PA-C 07/30/2020, 9:35 AM  [employer]

## 2020-07-30 NOTE — Progress Notes (Signed)
PT Cancellation Note  Patient Details Name: Dennis Macias MRN: [id number] DOB: 30-Dec-1950   Cancelled Treatment:    Reason Eval/Treat Not Completed: Patient not medically ready;Medical issues which prohibited therapy (HCT and hemoglobin low, MD requested hold today). HCT most recently at 20.7 (would like it > 25) and hemoglobin 7.2. Secure chat with MD with him requesting hold off on PT today as he will most likely need blood. Will follow-up as able another day.  Moishe Spice, PT, DPT Acute Rehabilitation Services  Pager: [phone] Office: [place] 07/30/2020, 9:05 AM

## 2020-07-30 NOTE — Progress Notes (Signed)
Daily Rounding Note  07/30/2020, 2:03 PM  LOS: 1 day   SUBJECTIVE:   Chief complaint:    Anemia.  FOBT positive.  Patient denies abdominal pain.  Had not had a bowel movement today or yesterday.  No nausea.  Erratic appetite but tolerating p.o.  OBJECTIVE:         Vital signs in last 24 hours:    Temp:  [98.4 F (36.9 C)-99.7 F (37.6 C)] 99.7 F (37.6 C) (01/22 1352) Pulse Rate:  [68-75] 71 (01/22 1352) Resp:  [16-20] 16 (01/22 1352) BP: (112-137)/(55-71) 126/57 (01/22 1352) SpO2:  [95 %-100 %] 98 % (01/22 1352) Weight:  [71.3 kg] 71.3 kg (01/22 0500) Last BM Date: 07/28/20 Filed Weights   07/29/20 1204 07/30/20 0500  Weight: 69.8 kg 71.3 kg   General: Looks chronically ill, alert, comfortable Heart: RRR. Chest: No labored breathing.  Lungs clear bilaterally. Abdomen: Soft, nondistended, active bowel sounds.  Nontender. Extremities: Clean Ace bandage covering the left BKA stump ischemic looking eschar on toes. Neuro/Psych: Alert.  Oriented x3.  Calm.  Follows all commands.  Intake/Output from previous day: 01/21 0701 - 01/22 0700 In: 328 [Blood:328] Out: -   Intake/Output this shift: Total I/O In: 10 [I.V.:10] Out: -   Lab Results: Recent Labs    07/28/20 1750 07/29/20 1455 07/29/20 1947 07/30/20 0230  WBC 9.5  --  7.5 7.7  HGB 7.4* 7.6* 7.3* 7.2*  HCT 22.8* 21.9* 21.7* 20.7*  PLT 206  --  197 185   BMET Recent Labs    07/28/20 1750 07/30/20 0230  NA 137 135  K 3.7 4.1  CL 96* 96*  CO2 28 27  GLUCOSE 406* 195*  BUN 23 43*  CREATININE 2.13* 4.30*  CALCIUM 8.4* 8.6*   LFT Recent Labs    07/30/20 0230  ALBUMIN 2.6*   PT/INR No results for input(s): LABPROT, INR in the last 72 hours. Hepatitis Panel No results for input(s): HEPBSAG, HCVAB, HEPAIGM, HEPBIGM in the last 72 hours.  Studies/Results: MR BRAIN WO CONTRAST  Result Date: 07/29/2020 CLINICAL DATA:  Initial evaluation  for recurrent syncope. EXAM: MRI HEAD WITHOUT CONTRAST TECHNIQUE: Multiplanar, multiecho pulse sequences of the brain and surrounding structures were obtained without intravenous contrast. COMPARISON:  Prior MRI from 02/17/2020. FINDINGS: Brain: Diffuse prominence of the CSF containing spaces compatible with moderately advanced age-related cerebral atrophy. Extensive patchy and confluent T2/FLAIR hyperintensity within the periventricular and deep white matter both cerebral hemispheres most consistent with chronic small vessel ischemic disease, fairly advanced in nature. Few scatter remote lacunar infarcts noted about the left basal ganglia and hemispheric cerebral white matter. 4 mm focus of restricted diffusion seen involving the ventral medial left thalamus, consistent with an acute ischemic infarct (series 5, image 72). No associated hemorrhage or mass effect. No other evidence for acute or subacute ischemia. Gray-white matter differentiation otherwise maintained. No encephalomalacia to suggest chronic cortical infarction. No acute intracranial hemorrhage. Multiple scattered chronic micro hemorrhages seen throughout both cerebral hemispheres, likely hypertensive in nature. No mass lesion, midline shift or mass effect. No hydrocephalus or extra-axial fluid collection. Pituitary gland suprasellar region normal. Midline structures intact. Vascular: Major intracranial vascular flow voids are maintained. Skull and upper cervical spine: Craniocervical junction within normal limits. Degenerative spondylosis noted at C3-4 with resultant mild spinal stenosis. Bone marrow signal intensity within normal limits. No scalp soft tissue abnormality. Sinuses/Orbits: Patient status post bilateral ocular lens replacement. Globes and orbital soft  tissues demonstrate no acute finding. Paranasal sinuses are largely clear. No significant mastoid effusion. Inner ear structures grossly normal. Other: None. IMPRESSION: 1. 4 mm acute  ischemic nonhemorrhagic left thalamic infarct. 2. Moderately age-related cerebral atrophy with advanced chronic microvascular ischemic disease. 3. Multiple chronic micro hemorrhages scattered throughout both cerebral hemispheres, favored to be related to chronic poorly controlled hypertension. Electronically Signed   By: Jeannine Boga M.D.   On: 07/29/2020 03:26   DG Chest Port 1 View  Result Date: 07/28/2020 CLINICAL DATA:  Syncopal episode following dialysis therapy EXAM: PORTABLE CHEST 1 VIEW COMPARISON:  02/17/2020 FINDINGS: Cardiac shadows within normal limits. Aortic calcifications are seen. Previously seen dialysis catheter has been removed in the interval. The lungs are clear. No bony abnormality is noted. IMPRESSION: No active disease. Electronically Signed   By: Inez Catalina M.D.   On: 07/28/2020 17:09   DG Foot Complete Right  Result Date: 07/28/2020 CLINICAL DATA:  NECROSIS OF 1ST AND 2ND TOE PER ORDER / PATIENT STATES HE IS DIABETICthe EXAM: RIGHT FOOT COMPLETE - 3+ VIEW COMPARISON:  None. FINDINGS: No osseous erosion associated with the first or second toe. No soft tissue gas or ulceration identified. Extensive arterial vascular calcifications noted. IMPRESSION: No evidence of osteomyelitis. Electronically Signed   By: Suzy Bouchard M.D.   On: 07/28/2020 18:31   VAS [place] ABI WITH/WO TBI  Result Date: 07/29/2020 LOWER EXTREMITY DOPPLER STUDY Indications: Ulceration, and peripheral artery disease. High Risk         Hypertension, hyperlipidemia, Diabetes, past history of Factors:          smoking.  Vascular Interventions: AVF Right arm. Limitations: Today's exam was limited due to AVF RT Lt AKA. Comparison Study: Prev Performing Technologist: Vonzell Schlatter RVT  Examination Guidelines: A complete evaluation includes at minimum, Doppler waveform signals and systolic blood pressure reading at the level of bilateral brachial, anterior tibial, and posterior tibial arteries, when vessel  segments are accessible. Bilateral testing is considered an integral part of a complete examination. Photoelectric Plethysmograph (PPG) waveforms and toe systolic pressure readings are included as required and additional duplex testing as needed. Limited examinations for reoccurring indications may be performed as noted.  ABI Findings: +--------+------------------+-----+----------+--------+ Right   Rt Pressure (mmHg)IndexWaveform  Comment  +--------+------------------+-----+----------+--------+ Brachial                                 AVF      +--------+------------------+-----+----------+--------+ PTA     216               1.52 biphasic           +--------+------------------+-----+----------+--------+ DP      226               1.59 monophasic         +--------+------------------+-----+----------+--------+ +--------+------------------+-----+---------+-------+ Left    Lt Pressure (mmHg)IndexWaveform Comment +--------+------------------+-----+---------+-------+ [id number]                    triphasic        +--------+------------------+-----+---------+-------+ +-------+-----------+-----------+------------+------------+ ABI/TBIToday's ABIToday's TBIPrevious ABIPrevious TBI +-------+-----------+-----------+------------+------------+ Right  1.5                   1.2                      +-------+-----------+-----------+------------+------------+ Arterial wall calcification precludes accurate ankle pressures and ABIs.  Summary: Right: Resting right  ankle-brachial index indicates noncompressible right lower extremity arteries. ABIs are unreliable.  *See table(s) above for measurements and observations.  Electronically signed by Deitra Mayo MD on 07/29/2020 at 3:10:15 PM.   Final    VAS [place] LOWER EXTREMITY ARTERIAL DUPLEX  Result Date: 07/29/2020 LOWER EXTREMITY ARTERIAL DUPLEX STUDY Indications: Ulceration, and peripheral artery disease. High Risk Factors:  Hypertension, hyperlipidemia, Diabetes, past history of                    smoking.  Vascular Interventions: AVF RT                         Left AKA. Current ABI:            1.5 Comparison Study: 03/30/20 Performing Technologist: Vonzell Schlatter RVT  Examination Guidelines: A complete evaluation includes B-mode imaging, spectral Doppler, color Doppler, and power Doppler as needed of all accessible portions of each vessel. Bilateral testing is considered an integral part of a complete examination. Limited examinations for reoccurring indications may be performed as noted.  +-----------+--------+-----+--------+--------+---------+ RIGHT      PSV cm/sRatioStenosisWaveformComments  +-----------+--------+-----+--------+--------+---------+ EIA Distal 55                                     +-----------+--------+-----+--------+--------+---------+ CFA Mid    62                                     +-----------+--------+-----+--------+--------+---------+ DFA        52                                     +-----------+--------+-----+--------+--------+---------+ SFA Prox   78                           shadowing +-----------+--------+-----+--------+--------+---------+ SFA Mid    76                           shadowing +-----------+--------+-----+--------+--------+---------+ SFA Distal 38                           shadowing +-----------+--------+-----+--------+--------+---------+ POP Mid    57                           shadowing +-----------+--------+-----+--------+--------+---------+ ATA Distal 127                                    +-----------+--------+-----+--------+--------+---------+ PTA Distal 15                                     +-----------+--------+-----+--------+--------+---------+ PERO Distal34                                     +-----------+--------+-----+--------+--------+---------+ DP         43                                      +-----------+--------+-----+--------+--------+---------+  Summary: Right: ABIs are unreliable.  See table(s) above for measurements and observations. Electronically signed by Deitra Mayo MD on 07/29/2020 at 3:10:41 PM.    Final    Scheduled Meds: . sodium chloride   Intravenous Once  . sodium chloride   Intravenous Once  . aspirin EC  81 mg Oral Daily  . atorvastatin  80 mg Oral QHS  . Chlorhexidine Gluconate Cloth  6 each Topical Daily  . clopidogrel  75 mg Oral Daily  . dorzolamide-timolol  1 drop Right Eye BID  . finasteride  5 mg Oral Daily  . insulin aspart  0-6 Units Subcutaneous TID WC  . insulin glargine  7 Units Subcutaneous QHS  . pantoprazole  40 mg Oral BID AC  . sevelamer carbonate  800 mg Oral 3 times per day on Sun Mon Wed Fri   And  . sevelamer carbonate  800 mg Oral 2 times per day on Tue Thu Sat  . sodium chloride flush  3 mL Intravenous Q12H   Continuous Infusions: PRN Meds:.acetaminophen **OR** acetaminophen   ASSESMENT:   *     Acute on chronic anemia.  Transfusion PRBC this admission as well as September. Hgb 7.4 >> 1 PRBC >> 7.2 >> 1 PRBC.   FOBT positive stool.  No overt bleeding. H. pylori gastritis treated in 2019, no chronic H2 twice daily/PPI at home. Adenomatous colon polyps 2018, 2019.  *    Syncopal episode.  Recurrent CVA in setting of chronic DAPT for previous CVA and PVD.  *    Ischemic right toe lesions.  11/21 RLE balloon angioplasty posterior tibial artery Continues on uninterrupted Plavix, aspirin.  *    Diabetes.  *    ESRD on HD.   PLAN   *   Continue Protonix 40 po bid.  Renal/carb mod diet  *   Serial CBCs.    Azucena Freed  07/30/2020, 2:03 PM Phone [phone]

## 2020-07-31 DIAGNOSIS — I739 Peripheral vascular disease, unspecified: Secondary | ICD-10-CM | POA: Diagnosis not present

## 2020-07-31 DIAGNOSIS — R195 Other fecal abnormalities: Secondary | ICD-10-CM | POA: Diagnosis not present

## 2020-07-31 DIAGNOSIS — D649 Anemia, unspecified: Secondary | ICD-10-CM | POA: Diagnosis not present

## 2020-07-31 DIAGNOSIS — R7881 Bacteremia: Secondary | ICD-10-CM | POA: Diagnosis not present

## 2020-07-31 LAB — RENAL FUNCTION PANEL
Albumin: 2.6 g/dL — ABNORMAL LOW (ref 3.5–5.0)
Anion gap: 13 (ref 5–15)
BUN: 73 mg/dL — ABNORMAL HIGH (ref 8–23)
CO2: 26 mmol/L (ref 22–32)
Calcium: 9 mg/dL (ref 8.9–10.3)
Chloride: 97 mmol/L — ABNORMAL LOW (ref 98–111)
Creatinine, Ser: 5.22 mg/dL — ABNORMAL HIGH (ref 0.61–1.24)
GFR, Estimated: 11 mL/min — ABNORMAL LOW (ref >60)
Glucose, Bld: 160 mg/dL — ABNORMAL HIGH (ref 70–99)
Phosphorus: 3.1 mg/dL (ref 2.5–4.6)
Potassium: 4 mmol/L (ref 3.5–5.1)
Sodium: 136 mmol/L (ref 135–145)

## 2020-07-31 LAB — CBC
HCT: 23.8 % — ABNORMAL LOW (ref 39.0–52.0)
Hemoglobin: 8 g/dL — ABNORMAL LOW (ref 13.0–17.0)
MCH: 29.9 pg (ref 26.0–34.0)
MCHC: 33.6 g/dL (ref 30.0–36.0)
MCV: 88.8 fL (ref 80.0–100.0)
Platelets: 219 10*3/uL (ref 150–400)
RBC: 2.68 MIL/uL — ABNORMAL LOW (ref 4.22–5.81)
RDW: 13.9 % (ref 11.5–15.5)
WBC: 9.2 10*3/uL (ref 4.0–10.5)
nRBC: 0 % (ref 0.0–0.2)

## 2020-07-31 LAB — GLUCOSE, CAPILLARY
Glucose-Capillary: 127 mg/dL — ABNORMAL HIGH (ref 70–99)
Glucose-Capillary: 193 mg/dL — ABNORMAL HIGH (ref 70–99)
Glucose-Capillary: 167 mg/dL — ABNORMAL HIGH (ref 70–99)
Glucose-Capillary: 235 mg/dL — ABNORMAL HIGH (ref 70–99)

## 2020-07-31 MED ORDER — DOCUSATE SODIUM 100 MG PO CAPS
100.0000 mg | ORAL_CAPSULE | Freq: Every day | ORAL | Status: DC
  Administered 2020-07-31 – 2020-08-19 (×16): 100 mg via ORAL
  Filled 2020-07-31 (×18): qty 1

## 2020-07-31 NOTE — Progress Notes (Signed)
HD#2 Subjective:   Admitted overnight.  Evaluated at bedside this AM. Denies headache or abdominal pain. Urinating well, not passing his bowels but passing gas. He notes his feet are doing well.  Objective:   Vital signs in last 24 hours: Vitals:   07/30/20 2045 07/30/20 2342 07/31/20 0115 07/31/20 0446  BP: (!) 153/71 129/69  125/68  Pulse: 69 63  69  Resp: 20 18  16   Temp: 99.2 F (37.3 C) 98.8 F (37.1 C)    TempSrc: Oral Oral    SpO2: 97% 100%  98%  Weight:   68.3 kg   Height:       Supplemental O2: Room Air SpO2: 98 %  Physical Exam Constitutional:      General: He is not in acute distress.    Appearance: Normal appearance. He is not ill-appearing, toxic-appearing or diaphoretic.  HENT:     Head: Normocephalic and atraumatic.  Neck:     Vascular: Carotid bruit present.     Comments: Bruit appreciated at the R carotid.  Cardiovascular:     Rate and Rhythm: Normal rate and regular rhythm.     Pulses: Normal pulses.     Heart sounds: Normal heart sounds.     Comments: Palpable thrill of the RUE at fistula, bruit appreciated at the site.  Pulmonary:     Effort: Pulmonary effort is normal.  Musculoskeletal:     Cervical back: No tenderness.     Comments: No progression of the dry gangrene of the R great toe and second digit. Foot is cool to touch.   Neurological:     Mental Status: He is alert.  Psychiatric:        Mood and Affect: Mood normal.        Behavior: Behavior normal.      Pertinent Labs: CBC Latest Ref Rng & Units 07/31/2020 07/30/2020 07/30/2020  WBC 4.0 - 10.5 K/uL 9.2 8.7 7.7  Hemoglobin 13.0 - 17.0 g/dL 8.0(L) 8.6(L) 7.2(L)  Hematocrit 39.0 - 52.0 % 23.8(L) 25.2(L) 20.7(L)  Platelets 150 - 400 K/uL 219 222 185    CMP Latest Ref Rng & Units 07/30/2020 07/28/2020 05/10/2020  Glucose 70 - 99 mg/dL 195(H) 406(H) 251(H)  BUN 8 - 23 mg/dL 43(H) 23 28(H)  Creatinine 0.61 - 1.24 mg/dL 4.30(H) 2.13(H) 3.30(H)  Sodium 135 - 145 mmol/L 135 137 138   Potassium 3.5 - 5.1 mmol/L 4.1 3.7 3.6  Chloride 98 - 111 mmol/L 96(L) 96(L) 93(L)  CO2 22 - 32 mmol/L 27 28 -  Calcium 8.9 - 10.3 mg/dL 8.6(L) 8.4(L) -  Total Protein 6.5 - 8.1 g/dL - - -  Total Bilirubin 0.3 - 1.2 mg/dL - - -  Alkaline Phos 38 - 126 U/L - - -  AST 15 - 41 U/L - - -  ALT 0 - 44 U/L - - -    Imaging: MR BRAIN WO CONTRAST  Result Date: 07/29/2020 CLINICAL DATA:  Initial evaluation for recurrent syncope. EXAM: MRI HEAD WITHOUT CONTRAST TECHNIQUE: Multiplanar, multiecho pulse sequences of the brain and surrounding structures were obtained without intravenous contrast. COMPARISON:  Prior MRI from 02/17/2020. FINDINGS: Brain: Diffuse prominence of the CSF containing spaces compatible with moderately advanced age-related cerebral atrophy. Extensive patchy and confluent T2/FLAIR hyperintensity within the periventricular and deep white matter both cerebral hemispheres most consistent with chronic small vessel ischemic disease, fairly advanced in nature. Few scatter remote lacunar infarcts noted about the left basal ganglia and hemispheric cerebral white  matter. 4 mm focus of restricted diffusion seen involving the ventral medial left thalamus, consistent with an acute ischemic infarct (series 5, image 72). No associated hemorrhage or mass effect. No other evidence for acute or subacute ischemia. Gray-white matter differentiation otherwise maintained. No encephalomalacia to suggest chronic cortical infarction. No acute intracranial hemorrhage. Multiple scattered chronic micro hemorrhages seen throughout both cerebral hemispheres, likely hypertensive in nature. No mass lesion, midline shift or mass effect. No hydrocephalus or extra-axial fluid collection. Pituitary gland suprasellar region normal. Midline structures intact. Vascular: Major intracranial vascular flow voids are maintained. Skull and upper cervical spine: Craniocervical junction within normal limits. Degenerative spondylosis  noted at C3-4 with resultant mild spinal stenosis. Bone marrow signal intensity within normal limits. No scalp soft tissue abnormality. Sinuses/Orbits: Patient status post bilateral ocular lens replacement. Globes and orbital soft tissues demonstrate no acute finding. Paranasal sinuses are largely clear. No significant mastoid effusion. Inner ear structures grossly normal. Other: None. IMPRESSION: 1. 4 mm acute ischemic nonhemorrhagic left thalamic infarct. 2. Moderately age-related cerebral atrophy with advanced chronic microvascular ischemic disease. 3. Multiple chronic micro hemorrhages scattered throughout both cerebral hemispheres, favored to be related to chronic poorly controlled hypertension. Electronically Signed   By: Jeannine Boga M.D.   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 man with history of ESRD on HD TTS, type 2 diabetes mellitus, history of CVA, hypertension, heart failure, and peripheral vascular disease on dual antiplatelet therapy who presented after syncopal episode at dialysis and admitted for further work-up and evaluation.   This is hospital day 2.  Syncopal episode Post-HD hypotension Acute ischemic left thalamic infarct History of multiple prior CVAs (2017, 2021) HTN, HLD ED workup revealed a new acute ischemic left thalamic infarct. He is currently on dual antiplatelet therapy due to his peripheral vascular disease. - Continue home ASA and Plavix - Continue atorvastatin 80 mg   Acute on chronic  anemia Post transfusion CBC showed Hgb of 8.6 from 7.2. AM Hgb decreased to 8.0. Review of HD records shows hemoglobin of 11.3 on 07/21/20. - GI consulted and will be following closely during his stay. We appreciate their recommendations.  - If hematochezia or melena, will hold Plavix  - Protonix 40 mg twice daily  R 1st and 2nd digit ulcerations History of Peripheral vascular disease s/p L BKA On dual antiplatelet platelet therapy due to PVD. RLE duplex and ABI obtained however unreliable due to noncompressible RLE arteries. Vascular consulted and will plan for angiogram on Monday if patient remains admitted. - RLE angiogram Monday if patient still present.  ESRD on HD (TTS) Nephrology consulted, appreciate recs and coordination of HD. RUE Fistula viable.   Type 2 diabetes mellitus Patient has been previously well-controlled off medications. Last A1c 6.4% in 02/2020, likely falsely low in s/o ESRD. CBG 406 on admission, 230s since. - Lantus 7 units - SSI sensitive - Consider long-acting insulin if blood glucoses remain elevated - Carb/renal diet   Diet: Carb/Renal with 1200 mL fluid  restriction IVF: None VTE: DAPT Code: Full  Please contact the on call pager after 5 pm and on weekends at [phone].  Maudie Mercury, MD PGY-2 Internal Medicine Teaching Service Pager: [phone] 07/31/2020

## 2020-07-31 NOTE — Progress Notes (Signed)
[facility] Progress Note   Subjective:  Seen in room - looks comfortable. No CP/dyspnea, on room air. Looks like Hgb has remained fairly stable, and GI has signed off. VVS plans to follow-up with him as outpatient. We have him scheduled for dialysis tomorrow - this can be arranged as an outpatient if he is otherwise ready for discharge.   Objective Vitals:   07/30/20 2045 07/30/20 2342 07/31/20 0115 07/31/20 0446  BP: (!) 153/71 129/69  125/68  Pulse: 69 63  69  Resp: 20 18  16   Temp: 99.2 F (37.3 C) 98.8 F (37.1 C)    TempSrc: Oral Oral    SpO2: 97% 100%  98%  Weight:   68.3 kg   Height:       Physical Exam General: Well appearing man, NAD. Room air. Heart: RRR; no murmur Lungs: CTAB; no rales Abdomen: soft, non-tender Extremities: No LE edema, necrotic 1st-2nd R toes; L BKA. Dialysis Access: RUE AVF + bruit  Additional Objective Labs: Basic Metabolic Panel: Recent Labs  Lab 07/28/20 1750 07/30/20 0230  NA 137 135  K 3.7 4.1  CL 96* 96*  CO2 28 27  GLUCOSE 406* 195*  BUN 23 43*  CREATININE 2.13* 4.30*  CALCIUM 8.4* 8.6*  PHOS  --  2.9   Liver Function Tests: Recent Labs  Lab 07/30/20 0230  ALBUMIN 2.6*   CBC: Recent Labs  Lab 07/28/20 1750 07/29/20 1455 07/29/20 1947 07/30/20 0230 07/30/20 2023 07/31/20 0058  WBC 9.5  --  7.5 7.7 8.7 9.2  NEUTROABS 7.3  --   --  4.8  --   --   HGB 7.4*   < > 7.3* 7.2* 8.6* 8.0*  HCT 22.8*   < > 21.7* 20.7* 25.2* 23.8*  MCV 90.1  --  89.7 90.0 88.4 88.8  PLT 206  --  197 185 222 219   < > = values in this interval not displayed.   Studies/Results: VAS [place] ABI WITH/WO TBI  Result Date: 07/29/2020 LOWER EXTREMITY DOPPLER STUDY Indications: Ulceration, and peripheral artery disease. High Risk         Hypertension, hyperlipidemia, Diabetes, past history of Factors:          smoking.  Vascular Interventions: AVF Right arm. Limitations: Today's exam was limited due to AVF RT Lt AKA. Comparison Study:  Prev Performing Technologist: Vonzell Schlatter RVT  Examination Guidelines: A complete evaluation includes at minimum, Doppler waveform signals and systolic blood pressure reading at the level of bilateral brachial, anterior tibial, and posterior tibial arteries, when vessel segments are accessible. Bilateral testing is considered an integral part of a complete examination. Photoelectric Plethysmograph (PPG) waveforms and toe systolic pressure readings are included as required and additional duplex testing as needed. Limited examinations for reoccurring indications may be performed as noted.  ABI Findings: +--------+------------------+-----+----------+--------+ Right   Rt Pressure (mmHg)IndexWaveform  Comment  +--------+------------------+-----+----------+--------+ Brachial                                 AVF      +--------+------------------+-----+----------+--------+ PTA     216               1.52 biphasic           +--------+------------------+-----+----------+--------+ DP      226               1.59 monophasic         +--------+------------------+-----+----------+--------+ +--------+------------------+-----+---------+-------+  Left    Lt Pressure (mmHg)IndexWaveform Comment +--------+------------------+-----+---------+-------+ [id number]                    triphasic        +--------+------------------+-----+---------+-------+ +-------+-----------+-----------+------------+------------+ ABI/TBIToday's ABIToday's TBIPrevious ABIPrevious TBI +-------+-----------+-----------+------------+------------+ Right  1.5                   1.2                      +-------+-----------+-----------+------------+------------+ Arterial wall calcification precludes accurate ankle pressures and ABIs.  Summary: Right: Resting right ankle-brachial index indicates noncompressible right lower extremity arteries. ABIs are unreliable.  *See table(s) above for measurements and observations.   Electronically signed by Deitra Mayo MD on 07/29/2020 at 3:10:15 PM.   Final    VAS [place] LOWER EXTREMITY ARTERIAL DUPLEX  Result Date: 07/29/2020 LOWER EXTREMITY ARTERIAL DUPLEX STUDY Indications: Ulceration, and peripheral artery disease. High Risk Factors: Hypertension, hyperlipidemia, Diabetes, past history of                    smoking.  Vascular Interventions: AVF RT                         Left AKA. Current ABI:            1.5 Comparison Study: 03/30/20 Performing Technologist: Vonzell Schlatter RVT  Examination Guidelines: A complete evaluation includes B-mode imaging, spectral Doppler, color Doppler, and power Doppler as needed of all accessible portions of each vessel. Bilateral testing is considered an integral part of a complete examination. Limited examinations for reoccurring indications may be performed as noted.  +-----------+--------+-----+--------+--------+---------+ RIGHT      PSV cm/sRatioStenosisWaveformComments  +-----------+--------+-----+--------+--------+---------+ EIA Distal 55                                     +-----------+--------+-----+--------+--------+---------+ CFA Mid    62                                     +-----------+--------+-----+--------+--------+---------+ DFA        52                                     +-----------+--------+-----+--------+--------+---------+ SFA Prox   78                           shadowing +-----------+--------+-----+--------+--------+---------+ SFA Mid    76                           shadowing +-----------+--------+-----+--------+--------+---------+ SFA Distal 38                           shadowing +-----------+--------+-----+--------+--------+---------+ POP Mid    57                           shadowing +-----------+--------+-----+--------+--------+---------+ ATA Distal 127                                    +-----------+--------+-----+--------+--------+---------+  PTA Distal 15                                      +-----------+--------+-----+--------+--------+---------+ PERO Distal34                                     +-----------+--------+-----+--------+--------+---------+ DP         43                                     +-----------+--------+-----+--------+--------+---------+  Summary: Right: ABIs are unreliable.  See table(s) above for measurements and observations. Electronically signed by Deitra Mayo MD on 07/29/2020 at 3:10:41 PM.    Final    Medications:  . sodium chloride   Intravenous Once  . aspirin EC  81 mg Oral Daily  . atorvastatin  80 mg Oral QHS  . Chlorhexidine Gluconate Cloth  6 each Topical Daily  . clopidogrel  75 mg Oral Daily  . dorzolamide-timolol  1 drop Right Eye BID  . finasteride  5 mg Oral Daily  . insulin aspart  0-6 Units Subcutaneous TID WC  . insulin glargine  7 Units Subcutaneous QHS  . pantoprazole  40 mg Oral BID AC  . sevelamer carbonate  800 mg Oral 3 times per day on Sun Mon Wed Fri   And  . sevelamer carbonate  800 mg Oral 2 times per day on Tue Thu Sat  . sodium chloride flush  3 mL Intravenous Q12H    Dialysis Orders: TTS -[facility] 4hrs, BFR400, [id number], EDW 69kg,2K/2.25Ca Access:RU AVF Heparin4000 unit bolus Hectorol82mcg IV qHD   Assessment/Plan: 1. Syncopal episode -likely multifactorial - acute CVA, hypotension, and acute on chronic anemia 2. Acute CVA- seen on MRI. Thought to be related to cerebral hypoperfusion with hypotension post HD, as this was the cause of a past stroke. Per neuro/Admit 3. R 1st & 2nd digit ulcerations/Hx PVD: S/p RLE balloon angioplasty to PTA 05/29/20. VVS consulted, plan is outpatient f/u. 4. ESRD: Usual TTS schedule - last dialyzed on 1/20. D/t high patient volume and staffing issues, next HD postponed to Monday 1/24. 5. Hypertension/volume: BP controlled, no volume on exam. Keep SBP > 100. 6. Acute on chronic Anemiaof CKD: + FOBT, Hgb drop 11.3 (on  1/13 as OP) to 7.4, s/p 1 unit pRBC 1/20. Hgb stable now. GI consulted, following for now on ^ dose PPI.May have EGD if starts having signs active bleeding. Will given ESA with next HD. 7. Secondary Hyperparathyroidism: Ca/Phos ok. Continue VDRA and binders. 8. Nutrition- Renal diet w/fluid restrictions.  9.  DMT2 10. Dispo: If he is ready for discharge today, we can arrange for dialysis as outpatient tomorrow. Please let our team know.   Veneta Penton, PA-C 07/31/2020, 8:59 AM  [employer]

## 2020-07-31 NOTE — Progress Notes (Addendum)
Daily Rounding Note  07/31/2020, 9:05 AM  LOS: 2 days   SUBJECTIVE:   Chief complaint:  FOBT + anemia.    Hemodynamically stable, no tachycardia or hypotension, though SBPs soft in 120s at times.   Doing well w Carb mod/renal diet, appetite "fair" (his words).   No BM for at least 3 days but pt declines offer of laxative.    Patient denies chest pain or dyspnea  OBJECTIVE:         Vital signs in last 24 hours:    Temp:  [98.7 F (37.1 C)-99.7 F (37.6 C)] 98.8 F (37.1 C) (01/22 2342) Pulse Rate:  [63-75] 69 (01/23 0446) Resp:  [14-20] 16 (01/23 0446) BP: (118-153)/(57-76) 125/68 (01/23 0446) SpO2:  [95 %-100 %] 98 % (01/23 0446) Weight:  [68.3 kg] 68.3 kg (01/23 0115) Last BM Date: 07/28/20 Filed Weights   07/29/20 1204 07/30/20 0500 07/31/20 0115  Weight: 69.8 kg 71.3 kg 68.3 kg   General: looks chronically ill.  Resting comfortably, awakened easily (attending addendum: This patient's significant other was at the bedside for my visit) Heart: RRR Chest: clear bil.  No cough or dyspnea.  Fair inspiratory effort Abdomen: soft, NT, ND.  acitve BS  Extremities: no CCE.  Ischemic/dry gangrene lesions on R toes, clean bandage covers L BKA stump Neuro/Psych:  Oriented x 3.  Flattened affect  Intake/Output from previous day: 01/22 0701 - 01/23 0700 In: 701.4 [I.V.:35; Blood:666.4] Out: 800 [Urine:800]  Intake/Output this shift: Total I/O In: 240 [P.O.:240] Out: -   Lab Results: Recent Labs    07/30/20 0230 07/30/20 2023 07/31/20 0058  WBC 7.7 8.7 9.2  HGB 7.2* 8.6* 8.0*  HCT 20.7* 25.2* 23.8*  PLT 185 222 219   BMET Recent Labs    07/28/20 1750 07/30/20 0230  NA 137 135  K 3.7 4.1  CL 96* 96*  CO2 28 27  GLUCOSE 406* 195*  BUN 23 43*  CREATININE 2.13* 4.30*  CALCIUM 8.4* 8.6*   LFT Recent Labs    07/30/20 0230  ALBUMIN 2.6*   PT/INR No results for input(s): LABPROT, INR in the last 72  hours. Hepatitis Panel No results for input(s): HEPBSAG, HCVAB, HEPAIGM, HEPBIGM in the last 72 hours.  Studies/Results: VAS [place] ABI WITH/WO TBI  Result Date: 07/29/2020 LOWER EXTREMITY DOPPLER STUDY Indications: Ulceration, and peripheral artery disease. High Risk         Hypertension, hyperlipidemia, Diabetes, past history of Factors:          smoking.  Vascular Interventions: AVF Right arm. Limitations: Today's exam was limited due to AVF RT Lt AKA. Comparison Study: Prev Performing Technologist: Vonzell Schlatter RVT  Examination Guidelines: A complete evaluation includes at minimum, Doppler waveform signals and systolic blood pressure reading at the level of bilateral brachial, anterior tibial, and posterior tibial arteries, when vessel segments are accessible. Bilateral testing is considered an integral part of a complete examination. Photoelectric Plethysmograph (PPG) waveforms and toe systolic pressure readings are included as required and additional duplex testing as needed. Limited examinations for reoccurring indications may be performed as noted.  ABI Findings: +--------+------------------+-----+----------+--------+ Right   Rt Pressure (mmHg)IndexWaveform  Comment  +--------+------------------+-----+----------+--------+ Brachial                                 AVF      +--------+------------------+-----+----------+--------+ PTA     216  1.52 biphasic           +--------+------------------+-----+----------+--------+ DP      226               1.59 monophasic         +--------+------------------+-----+----------+--------+ +--------+------------------+-----+---------+-------+ Left    Lt Pressure (mmHg)IndexWaveform Comment +--------+------------------+-----+---------+-------+ [id number]                    triphasic        +--------+------------------+-----+---------+-------+ +-------+-----------+-----------+------------+------------+ ABI/TBIToday's  ABIToday's TBIPrevious ABIPrevious TBI +-------+-----------+-----------+------------+------------+ Right  1.5                   1.2                      +-------+-----------+-----------+------------+------------+ Arterial wall calcification precludes accurate ankle pressures and ABIs.  Summary: Right: Resting right ankle-brachial index indicates noncompressible right lower extremity arteries. ABIs are unreliable.  *See table(s) above for measurements and observations.  Electronically signed by Deitra Mayo MD on 07/29/2020 at 3:10:15 PM.   Final    Scheduled Meds: . sodium chloride   Intravenous Once  . aspirin EC  81 mg Oral Daily  . atorvastatin  80 mg Oral QHS  . Chlorhexidine Gluconate Cloth  6 each Topical Daily  . clopidogrel  75 mg Oral Daily  . dorzolamide-timolol  1 drop Right Eye BID  . finasteride  5 mg Oral Daily  . insulin aspart  0-6 Units Subcutaneous TID WC  . insulin glargine  7 Units Subcutaneous QHS  . pantoprazole  40 mg Oral BID AC  . sevelamer carbonate  800 mg Oral 3 times per day on Sun Mon Wed Fri   And  . sevelamer carbonate  800 mg Oral 2 times per day on Tue Thu Sat  . sodium chloride flush  3 mL Intravenous Q12H   Continuous Infusions: PRN Meds:.acetaminophen **OR** acetaminophen  ASSESMENT:   *     Acute on chronic anemia.  Transfusion PRBC this admission as well as September. Hgb 7.4 >> 1 PRBC >> 7.2 >> 1 PRBC >> 8.    FOBT positive stool.  No overt bleeding. H. pylori gastritis treated in 2019, no chronic H2 twice daily/PPI at home. Adenomatous colon polyps 2018, 2019.  *    Syncopal episode.  Recurrent CVA in setting of chronic DAPT for previous CVA and PVD. Continues on Plavix, 81 ASA.    *    Dry gangrene right toes.  11/21 RLE balloon angioplasty posterior tibial artery Continues on uninterrupted Plavix, aspirin.  Vasc surgery following, no need for additional intervention or surgery at present.    *    Diabetes.  *    ESRD  on HD.    PLAN   *   Follow CBCs.    *   Eventually needs EGD, timing TBD.     *  Added daily colace.      Azucena Freed  07/31/2020, 9:05 AM Phone [phone]  I have discussed the case with the PA, and that is the plan I formulated. I personally interviewed and examined the patient.  No reported melena since admission.  Patient was transfused a unit of PRBCs yesterday for hemoglobin of 7.2, rose to 8.6 afterwards and is 8.0 today  He provides little in the way of additional history, does not seem to be having abdominal pain or vomiting.  Challenging case of acute on chronic anemia and dialysis patient on dual  antiplatelet therapy that is required after peripheral arterial intervention in November 2021. Upper endoscopy in 2019 showed a diminutive friable gastric polyp that was removed.  There was an additional small nonbleeding AVM, no intervention taken on it.  Today's note by nephrology indicates patient will have dialysis tomorrow.  I still feel he needs upper endoscopy, even though a complex intervention might not be feasible with the patient still therapeutic on Plavix.  However, an assessment of findings and therefore bleeding risk and prognosis would still be helpful.  We will see the patient tomorrow in hopes of making plans for an upper endoscopy the following day prior to discharge.  Dr. Scarlette Shorts and our consult service will see the patient tomorrow.   Nelida Meuse III Office: [phone]

## 2020-07-31 NOTE — Progress Notes (Signed)
   VASCULAR SURGERY ASSESSMENT & PLAN:   DRY GANGRENE FIRST AND SECOND TOES RIGHT FOOT: There has been no significant change to the dry gangrene of the toes on the right foot.  The patient has a warm foot and has undergone a tibial intervention by Dr. Trula Slade.  Once the patient is discharged he plans to follow these wounds as an outpatient.   SUBJECTIVE:   No specific complaints.  PHYSICAL EXAM:   Vitals:   07/30/20 2045 07/30/20 2342 07/31/20 0115 07/31/20 0446  BP: (!) 153/71 129/69  125/68  Pulse: 69 63  69  Resp: 20 18  16   Temp: 99.2 F (37.3 C) 98.8 F (37.1 C)    TempSrc: Oral Oral    SpO2: 97% 100%  98%  Weight:   68.3 kg   Height:       No change to the first and second toes on the right foot with dry gangrene.  Mild ecchymosis on the dorsum of the foot.  LABS:   Lab Results  Component Value Date   WBC 9.2 07/31/2020   HGB 8.0 (L) 07/31/2020   HCT 23.8 (L) 07/31/2020   MCV 88.8 07/31/2020   PLT 219 07/31/2020   CBG (last 3)  Recent Labs    07/30/20 1846 07/30/20 2136 07/31/20 0617  GLUCAP 201* 168* 127*    PROBLEM LIST:    Active Problems:   Diabetes mellitus with retinopathy of both eyes (HCC)   Hyperlipidemia   Acute on chronic anemia   Chronic combined systolic and diastolic heart failure (HCC)   ESRD (end stage renal disease) ([place])   Acute cerebrovascular accident (CVA) due to ischemia (HCC)   PAD (peripheral artery disease) (HCC)   Orthostatic hypotension   S/P BKA (below knee amputation) ([place])   Syncope   Stroke ([place])   CURRENT MEDS:   . sodium chloride   Intravenous Once  . aspirin EC  81 mg Oral Daily  . atorvastatin  80 mg Oral QHS  . Chlorhexidine Gluconate Cloth  6 each Topical Daily  . clopidogrel  75 mg Oral Daily  . dorzolamide-timolol  1 drop Right Eye BID  . finasteride  5 mg Oral Daily  . insulin aspart  0-6 Units Subcutaneous TID WC  . insulin glargine  7 Units Subcutaneous QHS  . pantoprazole  40 mg Oral BID AC  .  sevelamer carbonate  800 mg Oral 3 times per day on Sun Mon Wed Fri   And  . sevelamer carbonate  800 mg Oral 2 times per day on Tue Thu Sat  . sodium chloride flush  3 mL Intravenous Q12H    Deitra Mayo Office: [phone] 07/31/2020

## 2020-07-31 NOTE — Evaluation (Signed)
Occupational Therapy Evaluation Patient Details Name: Dennis Macias MRN: [id number] DOB: [birth year] Today's Date: 07/31/2020    History of Present Illness Pt is a 70 y/o male with PMH of L BKA, s/p balloon angioplasty of occluded vessels R foot 11/21, L ICA CVA (2017), NIDDM type II with retinopathy, CKD on hemodialysis, nephrotic syndrome, combined systolic and diastolic CHF, CAD w/ NSTEMI, HTN, HLD, tubular adenmoa of colon, tachycardia, syncope, PVD, and protein malnutrition who presents after a syncopal episode at hemodialysis. His R foot wounds continue to worsen with noted dry gangrene on 1st and 2nd toes. MRI revealed acute ischemic nonhemorrhagic L thalamic infarct and multiple chronic micro hemorrhages scattered throughout both cerebral hemispheres.   Clinical Impression   Pt admitted with above. He demonstrates the below listed deficits and will benefit from continued OT to maximize safety and independence with BADLs.  Pt presents to OT with generalized weakness, decreased activity tolerance, impaired balance.  He currently require mod - total A for ADLs.  He refused EOB or OOB with me today.  He reports he lives with his girlfriend (who works part time) .  He reports girlfriend, as well as, his son and daughter assist him with transfers, LB ADLs, and toileting.  (he has needed DME and AE).  He reports his plan is to return home with girlfriend.  Will follow acutely.       Follow Up Recommendations  Home health OT;Supervision/Assistance - 24 hour    Equipment Recommendations  None recommended by OT    Recommendations for Other Services       Precautions / Restrictions Precautions Precautions: Fall Precaution Comments: hx of L BKA; gangrene R foot; HOH      Mobility Bed Mobility Overal bed mobility: Needs Assistance Bed Mobility: Rolling Rolling: Supervision (with use of rails)         General bed mobility comments: Pt able to roll side to side, and to scoot self up in the  bed with the bed flat.  He refused EOB    Transfers                 General transfer comment: Pt refused OOB/EOB today    Balance                                           ADL either performed or assessed with clinical judgement   ADL Overall ADL's : Independent;Needs assistance/impaired Eating/Feeding: Independent   Grooming: Wash/dry hands;Wash/dry face;Brushing hair;Set up;Bed level   Upper Body Bathing: Minimal assistance;Bed level   Lower Body Bathing: Maximal assistance;Bed level   Upper Body Dressing : Moderate assistance;Bed level   Lower Body Dressing: Total assistance;Bed level   Toilet Transfer: Total assistance Toilet Transfer Details (indicate cue type and reason): pt refused to attempt Toileting- Clothing Manipulation and Hygiene: Total assistance;Bed level               Vision Baseline Vision/History: Retinopathy;Wears glasses;Cataracts Wears Glasses: Reading only Patient Visual Report: No change from baseline Vision Assessment?: Yes Eye Alignment: Within Functional Limits Ocular Range of Motion: Within Functional Limits Tracking/Visual Pursuits: Able to track stimulus in all quads without difficulty Visual Fields: No apparent deficits Additional Comments: Pt with noticeable cataract Rt eye.  He reports he is unable to see out of that eye due to diabetes.  He reports he wears glasses for close vision  Perception Perception Perception Tested?: Yes   Praxis      Pertinent Vitals/Pain Pain Assessment: No/denies pain     Hand Dominance Right   Extremity/Trunk Assessment Upper Extremity Assessment Upper Extremity Assessment: Generalized weakness   Lower Extremity Assessment Lower Extremity Assessment: Defer to PT evaluation   Cervical / Trunk Assessment Cervical / Trunk Assessment: Normal   Communication Communication Communication: HOH   Cognition Arousal/Alertness: Awake/alert Behavior During Therapy: WFL  for tasks assessed/performed Overall Cognitive Status: No family/caregiver present to determine baseline cognitive functioning                                 General Comments: pt alert and oriented x 4.  He will follow commands.  He is slow to process info, but this could be compounded by hearing loss.   General Comments       Exercises     Shoulder Instructions      Home Living Family/patient expects to be discharged to:: Private residence Living Arrangements: Spouse/significant other;Children Available Help at Discharge: Family;Available 24 hours/day Type of Home: Apartment Home Access: Stairs to enter Entrance Stairs-Number of Steps: 6 Entrance Stairs-Rails: Right Home Layout: One level     Bathroom Shower/Tub: Teacher, early years/pre: Handicapped height Bathroom Accessibility: No   Home Equipment: Tub bench;Walker - 2 wheels;Cane - quad;Cane - single point;Bedside commode;Wheelchair - manual   Additional Comments: per pt he lives with his girlfriend.  SHe, his son, and daughter provide care as needed.  Girlfriend and son work part time      Prior Functioning/Environment Level of Independence: Needs assistance  Gait / Transfers Assistance Needed: Pt requires assistance for transfers and is carried down/up the stairs in his w/c. Pt has not started to walk yet, is hoping to get a prothesis soon. Does not need help with bed mobility though. He reports he receives HHPT and is working on being able to stand ADL's / [employer] Assistance Needed: Pt requires assistance for bathing and dressing lower half. Pt does not cook or clean. He reports his w/c doesn't fit through BR door so therefore, he sponge bathes Communication / Swallowing Assistance Needed: Pt is independent with feeding self.          OT Problem List: Decreased strength;Decreased activity tolerance;Impaired balance (sitting and/or standing);Decreased cognition;Decreased knowledge of use  of DME or AE;Decreased safety awareness      OT Treatment/Interventions: Self-care/ADL training;Therapeutic exercise;DME and/or AE instruction;Therapeutic activities;Visual/perceptual remediation/compensation;Cognitive remediation/compensation;Patient/family education;Balance training;Neuromuscular education    OT Goals(Current goals can be found in the care plan section) Acute Rehab OT Goals Patient Stated Goal: to go home OT Goal Formulation: With patient Time For Goal Achievement: 08/14/20 Potential to Achieve Goals: Good ADL Goals Pt Will Perform Grooming: (P) with set-up;sitting Pt Will Perform Upper Body Bathing: (P) with set-up;sitting Pt Will Perform Lower Body Bathing: (P) with mod assist;sit to/from stand;sitting/lateral leans Pt Will Perform Upper Body Dressing: (P) with set-up;sitting;with supervision Pt Will Transfer to Toilet: (P) with mod assist;squat pivot transfer;bedside commode Pt Will Perform Toileting - Clothing Manipulation and hygiene: (P) with max assist;sit to/from stand  OT Frequency: Min 2X/week   Barriers to D/C:    pt reports family can continue to provide assist       Co-evaluation              AM-PAC OT "6 Clicks" Daily Activity     Outcome Measure Help  from another person eating meals?: None Help from another person taking care of personal grooming?: A Little Help from another person toileting, which includes using toliet, bedpan, or urinal?: Total Help from another person bathing (including washing, rinsing, drying)?: A Lot Help from another person to put on and taking off regular upper body clothing?: A Lot Help from another person to put on and taking off regular lower body clothing?: Total 6 Click Score: 13   End of Session Nurse Communication: Mobility status  Activity Tolerance: Patient limited by fatigue Patient left: in bed;with call bell/phone within reach  OT Visit Diagnosis: Unsteadiness on feet (R26.81)                Time:  1884-1660 OT Time Calculation (min): 15 min Charges:  OT General Charges $OT Visit: 1 Visit OT Evaluation $OT Eval Moderate Complexity: 1 Mod  Nilsa Nutting., OTR/L Acute Rehabilitation Services Pager [phone] Office [phone]   Lucille Passy M 07/31/2020, 4:29 PM

## 2020-07-31 NOTE — Evaluation (Addendum)
Physical Therapy Evaluation Patient Details Name: Dennis Macias MRN: [id number] DOB: 1950/10/05 Today's Date: 07/31/2020   History of Present Illness  Pt is a 70 y/o male with PMH of L BKA, s/p balloon angioplasty of occluded vessels R foot 11/21, L ICA CVA (2017), NIDDM type II with retinopathy, CKD on hemodialysis, nephrotic syndrome, combined systolic and diastolic CHF, CAD w/ NSTEMI, HTN, HLD, tubular adenmoa of colon, tachycardia, syncope, PVD, and protein malnutrition who presents after a syncopal episode at hemodialysis. His R foot wounds continue to worsen with noted dry gangrene on 1st and 2nd toes. MRI revealed acute ischemic nonhemorrhagic L thalamic infarct and multiple chronic micro hemorrhages scattered throughout both cerebral hemispheres.  Clinical Impression  Pt presents with condition mentioned above and deficits mentioned below, see PT Problem List. Pt lives with his girlfriend in an apartment with 6 stairs to descend to enter. PTA, he was not yet walking (he is hopeful for a prothesis soon) and required assistance for transfers and to be carried in his w/c down/up the stairs. He reports having 24/7 assistance available at discharge from physically capable family members. Pt appears to be functioning below baseline as he required minA to roll and modA to transition sidelying to sit EOB and minA to squat pivot between bed and w/c this date, whereas he reports being independent with bed mobility and intermittently being able to transfer without assistance at baseline. Pt also displays decreased R leg strength compared to his assumed and reported normal and in comparison to his L. Pt lacks sensation to light touch in the dorsal aspect of his R foot and toes. These limitations place him at risk for falls. Will continue to follow acutely. Recommending pt continue to follow-up with [facility] PT upon d/c home assuming he has the necessary assistance at home for safe d/c.     Follow Up Recommendations  Home health PT;Supervision for mobility/OOB    Equipment Recommendations  None recommended by PT    Recommendations for Other Services OT consult     Precautions / Restrictions Precautions Precautions: Fall Precaution Comments: hx of L BKA; gangrene R foot; HOH Restrictions Weight Bearing Restrictions: No      Mobility  Bed Mobility Overal bed mobility: Needs Assistance Bed Mobility: Rolling;Sidelying to Sit;Sit to Supine Rolling: Min assist Sidelying to sit: Mod assist   Sit to supine: Min guard   General bed mobility comments: Pt attempted several times to pull with R hand on R side of mattress to sit up R EOB, without success. Cued pt to reach L hand across to R side of bed and roll with minA at hips. Cued to push up on hands and extend R elbow to ascend trunk, modA. Return to supine with min guard assist. Bed flat with all bed rails down with bed mob.    Transfers Overall transfer level: Needs assistance Equipment used: None Transfers: Set designer Transfers;Lateral/Scoot Transfers     Squat pivot transfers: Min assist    Lateral/Scoot Transfers: Min guard General transfer comment: Pt able to scoot laterally EOB with min guard for safety. Squat pivot R EOB towards L to w/c with arm rest dropped with minA as pt attempts to sit half-way between objects. Squat pivot back to elevated bed towards pt's R with minA to clear buttocks.  Ambulation/Gait             General Gait Details: Did not attempt, pt was not ambulating yet PTA  Stairs  Wheelchair Mobility    Modified Rankin (Stroke Patients Only) Modified Rankin (Stroke Patients Only) Pre-Morbid Rankin Score: Severe disability Modified Rankin: Severe disability     Balance Overall balance assessment: Mild deficits observed, not formally tested                                           Pertinent Vitals/Pain Pain Assessment: No/denies pain    Home Living Family/patient  expects to be discharged to:: Private residence Living Arrangements: Spouse/significant other (girlfriend) Available Help at Discharge: Family;Available 24 hours/day Type of Home: Apartment Home Access: Stairs to enter Entrance Stairs-Rails: Right (ascending) Entrance Stairs-Number of Steps: 6 Home Layout: One level Home Equipment: Tub bench;Walker - 2 wheels;Cane - quad;Cane - single point;Bedside commode;Wheelchair - manual      Prior Function Level of Independence: Needs assistance   Gait / Transfers Assistance Needed: Pt requires assistance for transfers and is carried down/up the stairs in his w/c. Pt has not started to walk yet, is hoping to get a prothesis soon. Does not need help with bed mobility though.  ADL's / Homemaking Assistance Needed: Pt requires assistance for bathing and dressing lower half. Pt does not cook or clean.        Hand Dominance   Dominant Hand: Right    Extremity/Trunk Assessment   Upper Extremity Assessment Upper Extremity Assessment: Defer to OT evaluation    Lower Extremity Assessment Lower Extremity Assessment: RLE deficits/detail RLE Deficits / Details: MMT scores of the following: hip flexion 4-, knee extension 4+, knee flexion 4-, ankle dorsiflexion 4 (MMT scores for L: hip flexion 4, knee extension 5, knee flexion 4+) RLE Sensation: decreased light touch (no sensation to light touch dorsal aspect of foot and great toe, intact elsewhere; dynamic proprioception intact) RLE Coordination: decreased gross motor (no dysdiadochokinesia or dysmetria noted)    Cervical / Trunk Assessment Cervical / Trunk Assessment: Normal  Communication   Communication: HOH  Cognition Arousal/Alertness: Awake/alert Behavior During Therapy: WFL for tasks assessed/performed Overall Cognitive Status: Within Functional Limits for tasks assessed                                 General Comments: A&Ox4. Pt aware of his deficits.      General  Comments General comments (skin integrity, edema, etc.): Redness dorsal aspect of R foot, bloack/gangrene R lateral aspect of 5th toe and on 1st and 2nd toe    Exercises     Assessment/Plan    PT Assessment Patient needs continued PT services  PT Problem List Decreased strength;Decreased activity tolerance;Decreased balance;Decreased mobility;Decreased coordination;Impaired sensation;Decreased skin integrity       PT Treatment Interventions DME instruction;Gait training;Functional mobility training;Therapeutic activities;Therapeutic exercise;Balance training;Neuromuscular re-education;Patient/family education;Wheelchair mobility training    PT Goals (Current goals can be found in the Care Plan section)  Acute Rehab PT Goals Patient Stated Goal: to go home PT Goal Formulation: With patient Time For Goal Achievement: 08/14/20 Potential to Achieve Goals: Good    Frequency Min 3X/week   Barriers to discharge        Co-evaluation               AM-PAC PT "6 Clicks" Mobility  Outcome Measure Help needed turning from your back to your side while in a flat bed without using bedrails?: A Little Help  needed moving from lying on your back to sitting on the side of a flat bed without using bedrails?: A Lot Help needed moving to and from a bed to a chair (including a wheelchair)?: A Little Help needed standing up from a chair using your arms (e.g., wheelchair or bedside chair)?: A Lot Help needed to walk in hospital room?: Total Help needed climbing 3-5 steps with a railing? : Total 6 Click Score: 12    End of Session Equipment Utilized During Treatment: Gait belt Activity Tolerance: Patient tolerated treatment well Patient left: in bed;with call bell/phone within reach;with bed alarm set   PT Visit Diagnosis: Unsteadiness on feet (R26.81);Muscle weakness (generalized) (M62.81);Difficulty in walking, not elsewhere classified (R26.2);Other symptoms and signs involving the nervous  system (R29.898)    Time: 6734-1937 PT Time Calculation (min) (ACUTE ONLY): 30 min   Charges:   PT Evaluation $PT Eval Moderate Complexity: 1 Mod PT Treatments $Therapeutic Activity: 8-22 mins        Moishe Spice, PT, DPT Acute Rehabilitation Services  Pager: [phone] Office: [phone]   Orvan Falconer 07/31/2020, 11:22 AM

## 2020-08-01 DIAGNOSIS — R195 Other fecal abnormalities: Secondary | ICD-10-CM | POA: Diagnosis not present

## 2020-08-01 DIAGNOSIS — D649 Anemia, unspecified: Secondary | ICD-10-CM | POA: Diagnosis not present

## 2020-08-01 LAB — RENAL FUNCTION PANEL
Albumin: 2.5 g/dL — ABNORMAL LOW (ref 3.5–5.0)
Anion gap: 12 (ref 5–15)
BUN: 68 mg/dL — ABNORMAL HIGH (ref 8–23)
CO2: 25 mmol/L (ref 22–32)
Calcium: 8.7 mg/dL — ABNORMAL LOW (ref 8.9–10.3)
Chloride: 98 mmol/L (ref 98–111)
Creatinine, Ser: 4.19 mg/dL — ABNORMAL HIGH (ref 0.61–1.24)
GFR, Estimated: 15 mL/min — ABNORMAL LOW (ref >60)
Glucose, Bld: 138 mg/dL — ABNORMAL HIGH (ref 70–99)
Phosphorus: 2.3 mg/dL — ABNORMAL LOW (ref 2.5–4.6)
Potassium: 3.5 mmol/L (ref 3.5–5.1)
Sodium: 135 mmol/L (ref 135–145)

## 2020-08-01 LAB — CBC
HCT: 21.3 % — ABNORMAL LOW (ref 39.0–52.0)
Hemoglobin: 7 g/dL — ABNORMAL LOW (ref 13.0–17.0)
MCH: 29.7 pg (ref 26.0–34.0)
MCHC: 32.9 g/dL (ref 30.0–36.0)
MCV: 90.3 fL (ref 80.0–100.0)
Platelets: 232 10*3/uL (ref 150–400)
RBC: 2.36 MIL/uL — ABNORMAL LOW (ref 4.22–5.81)
RDW: 13.8 % (ref 11.5–15.5)
WBC: 8.5 10*3/uL (ref 4.0–10.5)
nRBC: 0 % (ref 0.0–0.2)

## 2020-08-01 LAB — GLUCOSE, CAPILLARY
Glucose-Capillary: 164 mg/dL — ABNORMAL HIGH (ref 70–99)
Glucose-Capillary: 132 mg/dL — ABNORMAL HIGH (ref 70–99)
Glucose-Capillary: 168 mg/dL — ABNORMAL HIGH (ref 70–99)
Glucose-Capillary: 222 mg/dL — ABNORMAL HIGH (ref 70–99)
Glucose-Capillary: 198 mg/dL — ABNORMAL HIGH (ref 70–99)

## 2020-08-01 LAB — HEMOGLOBIN AND HEMATOCRIT, BLOOD
HCT: 25.1 % — ABNORMAL LOW (ref 39.0–52.0)
Hemoglobin: 8.5 g/dL — ABNORMAL LOW (ref 13.0–17.0)

## 2020-08-01 LAB — PREPARE RBC (CROSSMATCH)

## 2020-08-01 MED ORDER — SODIUM CHLORIDE 0.9% IV SOLUTION: Freq: Once | INTRAVENOUS | Status: AC

## 2020-08-01 MED ORDER — CHLORHEXIDINE GLUCONATE CLOTH 2 % EX PADS
6.0000 | MEDICATED_PAD | Freq: Every day | CUTANEOUS | Status: DC
  Administered 2020-08-01 – 2020-08-10 (×9): 6 via TOPICAL

## 2020-08-01 NOTE — H&P (View-Only) (Signed)
Progress Note  Chief Complaint:         ASSESSMENT / PLAN:    # 70 yo male with HTN, DM, ESRD on HD, CVA, CHF, PVD s/p balloon angioplasty of right tibial artery in November,  adenomatous colon polyps, chronic anemia, hx of gastric AVMs, H.pylori gastritis( treated)  # Acute on chronic anemia, heme positive stool on DAPT for CVA / PVD. Hgb 7.6 on admit, down from 10.9 in November. Hgb improved after blood transfusion but declined from 8 to 7 overnight prompting transfusion of a 3rd unit of blood. No overt GI bleeding. Patient says he has not even had a BM since admission --Hx of gastric AVMs. He needs EGD ( on plavix ) which I will schedule for tomorrow am. However that is assuming patient remains stable as he required Rapid Response this am in dialysis for bradycardia with mental status changes and "body jerking" Currently he looks and feels okay except for being hungry.  --He can eat today from GI standpoint, NPO after MN  # Recurrent CVA, 4 mm infarct of left thalmus   SUBJECTIVE:   Hungry. Says he passed out this am in dialysis. Feels okay now. No BM / GI bleeding    PREVIOUS ENDOSCOPIES / GI EVALUATION   Sept 2019 Surveillance colonoscopy  -complete exam, good prep -Two 7 mm polyps in the sigmoid colon and at the ileocecal valve, removed with a cold snare. Resected and retrieved. - Post-polypectomy scar in the rectum. - The examination was otherwise normal on direct views.  Path = Tubular adenoma  May 2019 EGD for weight loss and anemia --Normal esophagus. - Gastritis. Biopsied. - A single gastric polyp. Resected and retrieved. - Two non-bleeding angiodysplastic lesions in the stomach. - Normal duodenal bulb and second portion of the duodenum. Diagnosis 1. Surgical [P], fundus and gastric body - SEVERE CHRONIC GASTRITIS WITH ACTIVITY AND ULCERATION - H. PYLORI ORGANISMS PRESENT - NO INTESTINAL METAPLASIA IDENTIFIED - SEE COMMENT 2. Surgical [P], fundus,  polyp - POLYPOID FRAGMENTS OF GASTRIC MUCOSA WITH SEVERE CHRONIC GASTRITIS - H. PYLORI ORGANISMS PRESENT - NO INTESTINAL METAPLASIA IDENTIFIED - SEE COMMENT   Scheduled inpatient medications:  . sodium chloride   Intravenous Once  . aspirin EC  81 mg Oral Daily  . atorvastatin  80 mg Oral QHS  . Chlorhexidine Gluconate Cloth  6 each Topical Q0600  . clopidogrel  75 mg Oral Daily  . docusate sodium  100 mg Oral Daily  . dorzolamide-timolol  1 drop Right Eye BID  . finasteride  5 mg Oral Daily  . insulin aspart  0-6 Units Subcutaneous TID WC  . insulin glargine  7 Units Subcutaneous QHS  . pantoprazole  40 mg Oral BID AC  . sevelamer carbonate  800 mg Oral 3 times per day on Sun Mon Wed Fri   And  . sevelamer carbonate  800 mg Oral 2 times per day on Tue Thu Sat  . sodium chloride flush  3 mL Intravenous Q12H   Continuous inpatient infusions:  PRN inpatient medications: acetaminophen **OR** acetaminophen  Vital signs in last 24 hours: Temp:  [97.6 F (36.4 C)-99.5 F (37.5 C)] 97.9 F (36.6 C) (01/24 0712) Pulse Rate:  [68-83] 71 (01/24 0712) Resp:  [15-21] 20 (01/24 1042) BP: (88-149)/(60-94) 133/63 (01/24 1042) SpO2:  [96 %-99 %] 99 % (01/24 1030) Weight:  [67.6 kg-71.7 kg] 67.6 kg (01/24 1030) Last BM Date: 07/28/20  Intake/Output Summary (Last 24 hours) at 08/01/2020  1150 Last data filed at 08/01/2020 8016 Gross per 24 hour  Intake 795 ml  Output 1184 ml  Net -389 ml     Physical Exam:  . General: Alert male in NAD . Heart:  Regular rate . Pulmonary: Normal respiratory effort . Abdomen: Soft, nondistended, nontender. Normal bowel sounds.  . Neurologic: Alert and oriented . Psych: Cooperative.   Filed Weights   08/01/20 0412 08/01/20 0712 08/01/20 1030  Weight: 71.7 kg 68.9 kg 67.6 kg    Intake/Output from previous day: 01/23 0701 - 01/24 0700 In: 1158 [P.O.:840; I.V.:3; Blood:315] Out: -  Intake/Output this shift: Total I/O In: -  Out: 1184  [PVVZS:8270]    Lab Results: Recent Labs    07/30/20 2023 07/31/20 0058 08/01/20 0235  WBC 8.7 9.2 8.5  HGB 8.6* 8.0* 7.0*  HCT 25.2* 23.8* 21.3*  PLT 222 219 232   BMET Recent Labs    07/30/20 0230 07/31/20 0058 08/01/20 0809  NA 135 136 135  K 4.1 4.0 3.5  CL 96* 97* 98  CO2 27 26 25   GLUCOSE 195* 160* 138*  BUN 43* 73* 68*  CREATININE 4.30* 5.22* 4.19*  CALCIUM 8.6* 9.0 8.7*   LFT Recent Labs    08/01/20 0809  ALBUMIN 2.5*   PT/INR No results for input(s): LABPROT, INR in the last 72 hours. Hepatitis Panel No results for input(s): HEPBSAG, HCVAB, HEPAIGM, HEPBIGM in the last 72 hours.  No results found.    Active Problems:   Diabetes mellitus with retinopathy of both eyes ([place])   Hyperlipidemia   Acute on chronic anemia   Chronic combined systolic and diastolic heart failure (HCC)   ESRD (end stage renal disease) ([place])   Acute cerebrovascular accident (CVA) due to ischemia [facility])   PAD (peripheral artery disease) (HCC)   Orthostatic hypotension   S/P BKA (below knee amputation) ([place])   Syncope   Stroke ([place])     LOS: 3 days   Tye Savoy ,NP 08/01/2020, 11:50 AM  GI ATTENDING  Interval history and data reviewed.  Patient seen and examined.  Very complex medical history.  Previous GI endoscopic evaluations as noted.  Now asked to see regarding reports of dark stools (though has not had a bowel movement in days) and interval drift in hemoglobin.  His anemia is multifactorial.  He is on antiplatelet therapy as noted.  We will perform upper endoscopy tomorrow to rule out any significant mucosal pathology given his history.  He is HIGH RISK.The nature of the procedure, as well as the risks, benefits, and alternatives were carefully and thoroughly reviewed with the patient. Ample time for discussion and questions allowed. The patient understood, was satisfied, and agreed to proceed.  Docia Chuck. Geri Seminole., M.D. [facility] Division of  Gastroenterology

## 2020-08-01 NOTE — Progress Notes (Signed)
   Patient seen and evaluated on dialysis.  Noted plans for EGD tomorrow.  He is ultimately going to need right lower extremity angiography which could be done either as an inpatient or could wait as an outpatient.  He will be high risk for amputation.  I have communicated with the primary team.  Eda Paschal. Donzetta Matters, MD [facility] Office: [phone] Pager: [phone]

## 2020-08-01 NOTE — Progress Notes (Addendum)
Progress Note  Chief Complaint:         ASSESSMENT / PLAN:    # 70 yo male with HTN, DM, ESRD on HD, CVA, CHF, PVD s/p balloon angioplasty of right tibial artery in November,  adenomatous colon polyps, chronic anemia, hx of gastric AVMs, H.pylori gastritis( treated)  # Acute on chronic anemia, heme positive stool on DAPT for CVA / PVD. Hgb 7.6 on admit, down from 10.9 in November. Hgb improved after blood transfusion but declined from 8 to 7 overnight prompting transfusion of a 3rd unit of blood. No overt GI bleeding. Patient says he has not even had a BM since admission --Hx of gastric AVMs. He needs EGD ( on plavix ) which I will schedule for tomorrow am. However that is assuming patient remains stable as he required Rapid Response this am in dialysis for bradycardia with mental status changes and "body jerking" Currently he looks and feels okay except for being hungry.  --He can eat today from GI standpoint, NPO after MN  # Recurrent CVA, 4 mm infarct of left thalmus   SUBJECTIVE:   Hungry. Says he passed out this am in dialysis. Feels okay now. No BM / GI bleeding    PREVIOUS ENDOSCOPIES / GI EVALUATION   Sept 2019 Surveillance colonoscopy  -complete exam, good prep -Two 7 mm polyps in the sigmoid colon and at the ileocecal valve, removed with a cold snare. Resected and retrieved. - Post-polypectomy scar in the rectum. - The examination was otherwise normal on direct views.  Path = Tubular adenoma  May 2019 EGD for weight loss and anemia --Normal esophagus. - Gastritis. Biopsied. - A single gastric polyp. Resected and retrieved. - Two non-bleeding angiodysplastic lesions in the stomach. - Normal duodenal bulb and second portion of the duodenum. Diagnosis 1. Surgical [P], fundus and gastric body - SEVERE CHRONIC GASTRITIS WITH ACTIVITY AND ULCERATION - H. PYLORI ORGANISMS PRESENT - NO INTESTINAL METAPLASIA IDENTIFIED - SEE COMMENT 2. Surgical [P], fundus,  polyp - POLYPOID FRAGMENTS OF GASTRIC MUCOSA WITH SEVERE CHRONIC GASTRITIS - H. PYLORI ORGANISMS PRESENT - NO INTESTINAL METAPLASIA IDENTIFIED - SEE COMMENT   Scheduled inpatient medications:  . sodium chloride   Intravenous Once  . aspirin EC  81 mg Oral Daily  . atorvastatin  80 mg Oral QHS  . Chlorhexidine Gluconate Cloth  6 each Topical Q0600  . clopidogrel  75 mg Oral Daily  . docusate sodium  100 mg Oral Daily  . dorzolamide-timolol  1 drop Right Eye BID  . finasteride  5 mg Oral Daily  . insulin aspart  0-6 Units Subcutaneous TID WC  . insulin glargine  7 Units Subcutaneous QHS  . pantoprazole  40 mg Oral BID AC  . sevelamer carbonate  800 mg Oral 3 times per day on Sun Mon Wed Fri   And  . sevelamer carbonate  800 mg Oral 2 times per day on Tue Thu Sat  . sodium chloride flush  3 mL Intravenous Q12H   Continuous inpatient infusions:  PRN inpatient medications: acetaminophen **OR** acetaminophen  Vital signs in last 24 hours: Temp:  [97.6 F (36.4 C)-99.5 F (37.5 C)] 97.9 F (36.6 C) (01/24 0712) Pulse Rate:  [68-83] 71 (01/24 0712) Resp:  [15-21] 20 (01/24 1042) BP: (88-149)/(60-94) 133/63 (01/24 1042) SpO2:  [96 %-99 %] 99 % (01/24 1030) Weight:  [67.6 kg-71.7 kg] 67.6 kg (01/24 1030) Last BM Date: 07/28/20  Intake/Output Summary (Last 24 hours) at 08/01/2020  1150 Last data filed at 08/01/2020 0923 Gross per 24 hour  Intake 795 ml  Output 1184 ml  Net -389 ml     Physical Exam:  . General: Alert male in NAD . Heart:  Regular rate . Pulmonary: Normal respiratory effort . Abdomen: Soft, nondistended, nontender. Normal bowel sounds.  . Neurologic: Alert and oriented . Psych: Cooperative.   Filed Weights   08/01/20 0412 08/01/20 0712 08/01/20 1030  Weight: 71.7 kg 68.9 kg 67.6 kg    Intake/Output from previous day: 01/23 0701 - 01/24 0700 In: 1158 [P.O.:840; I.V.:3; Blood:315] Out: -  Intake/Output this shift: Total I/O In: -  Out: 1184  [RAQTM:2263]    Lab Results: Recent Labs    07/30/20 2023 07/31/20 0058 08/01/20 0235  WBC 8.7 9.2 8.5  HGB 8.6* 8.0* 7.0*  HCT 25.2* 23.8* 21.3*  PLT 222 219 232   BMET Recent Labs    07/30/20 0230 07/31/20 0058 08/01/20 0809  NA 135 136 135  K 4.1 4.0 3.5  CL 96* 97* 98  CO2 27 26 25   GLUCOSE 195* 160* 138*  BUN 43* 73* 68*  CREATININE 4.30* 5.22* 4.19*  CALCIUM 8.6* 9.0 8.7*   LFT Recent Labs    08/01/20 0809  ALBUMIN 2.5*   PT/INR No results for input(s): LABPROT, INR in the last 72 hours. Hepatitis Panel No results for input(s): HEPBSAG, HCVAB, HEPAIGM, HEPBIGM in the last 72 hours.  No results found.    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  Patient seen and examined.  Very complex medical history.  Previous GI endoscopic evaluations as noted.  Now asked to see regarding reports of dark stools (though has not had a bowel movement in days) and interval drift in hemoglobin.  His anemia is multifactorial.  He is on antiplatelet therapy as noted.  We will perform upper endoscopy tomorrow to rule out any significant mucosal pathology given his history.  He is HIGH RISK.The nature of the procedure, as well as the risks, benefits, and alternatives were carefully and thoroughly reviewed with the patient. Ample time for discussion and questions allowed. The patient understood, was satisfied, and agreed to proceed.  Docia Chuck. Geri Seminole., M.D. [facility] Division of  Gastroenterology

## 2020-08-01 NOTE — Progress Notes (Addendum)
HD#3 Subjective:   Overnight, hemoglobin downtrended 8.6>8>7, 1 u pRBC ordered by cross cover.   During evaluation at bedside this morning, patient is watching TV in HD bed, appears comfortable. He denies bloody or dark stools, states he has not had a BM since admission. States he was not given breakfast this morning, had just eaten a graham cracker provided in HD.  Objective:   Vital signs in last 24 hours: Vitals:   07/31/20 2006 08/01/20 0022 08/01/20 0411 08/01/20 0412  BP: 137/65 131/62 126/65   Pulse: 79 70 71   Resp: 20 20 20    Temp: 99.5 F (37.5 C) 98.7 F (37.1 C) 98.6 F (37 C)   TempSrc: Oral Oral Oral   SpO2: 98% 99% 96%   Weight:    71.7 kg  Height:       Supplemental O2: Room Air SpO2: 96 %  Physical Exam Constitutional: chronically ill-appearing man lying in HD bed, in no acute distress Cardiovascular: regular rate and rhythm, no m/r/g Pulmonary/Chest: normal work of breathing on room air, lungs clear to auscultation bilaterally MSK: slight build, L BKA Skin: RLE cool and dry. No progression of the dry gangrene of R great toe and second digit.   Pertinent Labs: CBC Latest Ref Rng & Units 08/01/2020 07/31/2020 07/30/2020  WBC 4.0 - 10.5 K/uL 8.5 9.2 8.7  Hemoglobin 13.0 - 17.0 g/dL 7.0(L) 8.0(L) 8.6(L)  Hematocrit 39.0 - 52.0 % 21.3(L) 23.8(L) 25.2(L)  Platelets 150 - 400 K/uL 232 219 222    CMP Latest Ref Rng & Units 07/31/2020 07/30/2020 07/28/2020  Glucose 70 - 99 mg/dL 160(H) 195(H) 406(H)  BUN 8 - 23 mg/dL 73(H) 43(H) 23  Creatinine 0.61 - 1.24 mg/dL 5.22(H) 4.30(H) 2.13(H)  Sodium 135 - 145 mmol/L 136 135 137  Potassium 3.5 - 5.1 mmol/L 4.0 4.1 3.7  Chloride 98 - 111 mmol/L 97(L) 96(L) 96(L)  CO2 22 - 32 mmol/L 26 27 28   Calcium 8.9 - 10.3 mg/dL 9.0 8.6(L) 8.4(L)  Total Protein 6.5 - 8.1 g/dL - - -  Total Bilirubin 0.3 - 1.2 mg/dL - - -  Alkaline Phos 38 - 126 U/L - - -  AST 15 - 41 U/L - - -  ALT 0 - 44 U/L - - -   Imaging:  None  Assessment/Plan:   Active Problems:   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 man with history of ESRD on HD TTS, type 2 diabetes mellitus, history of CVA, hypertension, heart failure, and peripheral vascular disease on dual antiplatelet therapy who presented after syncopal episode at dialysis and admitted for further work-up and evaluation.   This is hospital day 3.  Syncopal episode Post-HD hypotension Acute ischemic left thalamic infarct History of multiple prior CVAs (2017, 2021) HTN, HLD ED workup revealed a new acute ischemic left thalamic infarct. On dual antiplatelet therapy due to his peripheral vascular disease. - Continue home ASA and Plavix - Continue atorvastatin 80 mg   Acute on chronic anemia FOBT positive S/p 1 u pRBC on admission for hemoglobin  of 7.4, decreased from 11.3 on 07/21/20. Post-transfusion, hemoglobin improved to 8.6 however has since downtrended 8.0->7.0 this morning. 1 u pRBC transfusing. Per GI yesterday, plan for upper endoscopy tomorrow (1/25). Will follow-up GI's recs today, NPO in case GI plans for earlier EGD. - f/u post-transfusion H&H - GI consulted and will be following closely during his stay. We appreciate their recommendations.  - If hematochezia or melena, will hold Plavix  - Likely EGD tomorrow (1/25)  - Protonix 40 mg twice daily  R 1st and 2nd digit ulcerations History of Peripheral vascular disease s/p L BKA On dual antiplatelet platelet therapy due to PVD. RLE duplex and ABI obtained however unreliable due to noncompressible RLE  arteries. Vascular consulted and plan for follow-up outpatient with Dr. Trula Slade.   ESRD on HD (TTS) Nephrology consulted, appreciate recs and coordination of HD. RUE Fistula viable.  - HD today (1/24)  Type 2 diabetes mellitus Patient has been previously well-controlled off medications. Last A1c 6.4% in 02/2020, likely falsely low in s/o ESRD. CBG 406 on admission, 230s since. - Lantus 7 units - SSI sensitive - Carb/renal diet   Diet: NPO IVF: None VTE: SCDs Code: Full  Please contact the on call pager after 5 pm and on weekends at [phone].  [name], MD PGY-1 Internal Medicine Teaching Service Pager: [phone] 08/01/2020   ADDENDUM:  Rapid response called while patient in HD for bradycardia in the 55s. Non-rebreather applied by RN and patient reportedly quickly regained consciousness. See rapid response note for more information. Similar to episode prior to admission. Continue to monitor, will f/u nephrology and GI recs.

## 2020-08-01 NOTE — Consult Note (Signed)
   [facility] Inpatient Consult   08/01/2020  [name] 12-11-1950 [id number]  [place] [employer] [ACO] Patient: Dennis Macias PPO  Patient was screened for [place] Management services. Patient will have the transition of care call conducted by the primary care provider. This patient is also in an [facility] which has a chronic disease management Embedded Care Management team.  Plan: The [facility] Embedded Care Management is aware of inpatient status and made aware of any transition of care needs for post hospital.   Please contact for further questions,  Natividad Brood, RN BSN [facility] Liaison  [phone] business mobile phone Toll free office [phone]  Fax number: [phone] [email or web address]

## 2020-08-01 NOTE — Progress Notes (Signed)
Blood started at 0600 @120ml /hr, v/s taken as per protocol, Pt transported to HD, with Tele,report handed over to HD RN.

## 2020-08-01 NOTE — Significant Event (Addendum)
Rapid Response Event Note   Reason for Call :  Bradycardia and brief loss of consciousness during HD treatment.   Pt had 28 minutes left on his dialysis treatment when RN noticed pt HR declined to the 40s. Pt noted to have "blank stare", eyes gazed upwards. She noted some body jerking, but it was non-rhythmic and side to side movement. RN applied a non-rebreather and he quickly regained consciousness.  Initial Focused Assessment:  Pt lying in bed. Skin is warm, moist, pink. Pt denies lightheadedness, dizziness, or chest pain. He denies recollection of the event that just occurred. His lung sounds are clear, no adventitious heart sounds. Heart rate is regular, 70 bpm. SpO2 98% on 100% NRB.  He has purposeful movements in all extremities.   VS: BP 101/82, HR 71, RR 19, SpO2 98% on 100% NRB  Interventions:  -CBG 132  Plan of Care:  -Wean oxygen as pt tolerates -Notify nephrologist of syncopal event during treatment  Call rapid response for additional needs  Event Summary:  MD Notified: IM resident Call Time: [place] Arrival Time: 0964 End Time: [place], RN

## 2020-08-01 NOTE — Progress Notes (Signed)
[facility] Progress Note   Subjective:   Seen on HD, receiving 1 unit PRBC. Reports he is hungry, otherwise no concerns. Denies SOB, CP, palpitations, dizziness, nausea and vomiting.   Objective Vitals:   08/01/20 0615 08/01/20 0712 08/01/20 0730 08/01/20 0800  BP: 127/63 136/66 124/61 (!) 131/94  Pulse: 68     Resp: 18 16 17    Temp: 98.8 F (37.1 C)     TempSrc: Oral Oral    SpO2: 98% 99%    Weight:      Height:       Physical Exam General: Well developed male, alert and in NAD Heart: RRR, no murmur, rubs or gallops Lungs: CTA anteriorly without wheezing, rhonchi or rales Abdomen: Soft, non-tender, non-distended, +BS Extremities: No edema b/l lower extremities Dialysis Access: RUE AVF accessed  Additional Objective Labs: Basic Metabolic Panel: Recent Labs  Lab 07/28/20 1750 07/30/20 0230 07/31/20 0058  NA 137 135 136  K 3.7 4.1 4.0  CL 96* 96* 97*  CO2 28 27 26   GLUCOSE 406* 195* 160*  BUN 23 43* 73*  CREATININE 2.13* 4.30* 5.22*  CALCIUM 8.4* 8.6* 9.0  PHOS  --  2.9 3.1   Liver Function Tests: Recent Labs  Lab 07/30/20 0230 07/31/20 0058  ALBUMIN 2.6* 2.6*   CBC: Recent Labs  Lab 07/28/20 1750 07/29/20 1455 07/29/20 1947 07/30/20 0230 07/30/20 2023 07/31/20 0058 08/01/20 0235  WBC 9.5  --  7.5 7.7 8.7 9.2 8.5  NEUTROABS 7.3  --   --  4.8  --   --   --   HGB 7.4*   < > 7.3* 7.2* 8.6* 8.0* 7.0*  HCT 22.8*   < > 21.7* 20.7* 25.2* 23.8* 21.3*  MCV 90.1  --  89.7 90.0 88.4 88.8 90.3  PLT 206  --  197 185 222 219 232   < > = values in this interval not displayed.   Blood Culture    Component Value Date/Time   SDES BLOOD LEFT HAND 04/02/2020 2049   SPECREQUEST  04/02/2020 2049    BOTTLES DRAWN AEROBIC ONLY Blood Culture adequate volume   CULT  04/02/2020 2049    NO GROWTH 5 DAYS Performed at [facility], [place] [address], [place], [place] [address]    REPTSTATUS 04/07/2020 FINAL 04/02/2020 2049    CBG: Recent Labs   Lab 07/31/20 0617 07/31/20 1231 07/31/20 1656 07/31/20 2114 08/01/20 0620  GLUCAP 127* 193* 167* 235* 164*   Medications:  . sodium chloride   Intravenous Once  . aspirin EC  81 mg Oral Daily  . atorvastatin  80 mg Oral QHS  . Chlorhexidine Gluconate Cloth  6 each Topical Daily  . clopidogrel  75 mg Oral Daily  . docusate sodium  100 mg Oral Daily  . dorzolamide-timolol  1 drop Right Eye BID  . finasteride  5 mg Oral Daily  . insulin aspart  0-6 Units Subcutaneous TID WC  . insulin glargine  7 Units Subcutaneous QHS  . pantoprazole  40 mg Oral BID AC  . sevelamer carbonate  800 mg Oral 3 times per day on Sun Mon Wed Fri   And  . sevelamer carbonate  800 mg Oral 2 times per day on Tue Thu Sat  . sodium chloride flush  3 mL Intravenous Q12H    Dialysis Orders: TTS -[facility] 4hrs, BFR400, [id number], EDW 69kg,2K/2.25Ca Access:RU AVF Heparin4000 unit bolus Hectorol24mcg IV qHD   Assessment/Plan: 1. Syncopal episode -likely multifactorial -  acute CVA, hypotension, and acute on chronic anemia. BP stable on HD so far today.  2. Acute CVA- seen on MRI. Thought to be related to cerebral hypoperfusion with hypotension post HD, as this was the cause of a past stroke. Per neuro/Admit 3. R 1st & 2nd digit ulcerations/Hx PVD: S/p RLE balloon angioplasty to PTA 05/29/20. VVS consulted, plan is outpatient f/u. 4. ESRD: Usual TTS schedule - last dialyzed on 1/20, rolled over to today due to high census/staffing shortage. Next HD tomorrow.  5. Hypertension/volume: BP controlled, no volume on exam.Keep SBP > 100. 6. Acute on chronic Anemiaof CKD:+ FOBT, Hgb drop 11.3 (on 1/13 as OP) to 7.4, s/p 1 unit pRBC1/20.GI consulted, followingfor now on ^ dose PPI.May have EGD if starts having signs active bleeding.Hgb 7.0 today- receiving 1 unit PRBC. Getting ESA today with HD. Will hold heparin with HD.  7. Secondary Hyperparathyroidism:Corrected calcium 10.1, Phos ok. Continue  binders.Hectorol already on hold, use low Ca bath.  8. Nutrition- Renal diet w/fluid restrictions.  9. DMT2  Dennis Paganini, PA-C 08/01/2020, 8:12 AM  [facility] Pager: [phone]

## 2020-08-01 NOTE — Anesthesia Preprocedure Evaluation (Signed)
Anesthesia Evaluation  Patient identified by MRN, date of birth, ID band Patient awake    Reviewed: Allergy & Precautions, NPO status , Patient's Chart, lab work & pertinent test results  Airway Mallampati: I       Dental  (+) Edentulous Upper, Edentulous Lower   Pulmonary former smoker,    Pulmonary exam normal        Cardiovascular hypertension, + CAD, + Past MI, + Peripheral Vascular Disease and +CHF  Normal cardiovascular exam  Echo 02/18/2020 1. Left ventricular ejection fraction, by estimation, is 60 to 65%. The left ventricle has normal function. The left ventricle has no regional wall motion abnormalities. Left ventricular diastolic parameters are consistent with Grade II diastolic dysfunction (pseudonormalization).  2. Right ventricular systolic function is normal. The right ventricular size is normal.  3. Left atrial size was moderately dilated.  4. Right atrial size was mildly dilated.  5. The mitral valve is normal in structure. No evidence of mitral valve regurgitation.  6. The aortic valve is normal in structure. Aortic valve regurgitation is not visualized.    Neuro/Psych CVA    GI/Hepatic GERD  Medicated and Controlled,  Endo/Other  diabetes, Insulin Dependent  Renal/GU Renal disease     Musculoskeletal  (+) Arthritis ,   Abdominal Normal abdominal exam  (+)   Peds  Hematology  (+) anemia ,   Anesthesia Other Findings   Reproductive/Obstetrics                                                             Anesthesia Evaluation  Patient identified by MRN, date of birth, ID band Patient awake    Reviewed: Allergy & Precautions, NPO status , Patient's Chart, lab work & pertinent test results  History of Anesthesia Complications Negative for: history of anesthetic complications  Airway Mallampati: II  TM Distance: >3 FB Neck ROM: Full    Dental  (+) Edentulous  Upper, Edentulous Lower, Dental Advisory Given   Pulmonary shortness of breath, neg recent URI, former smoker,    breath sounds clear to auscultation       Cardiovascular hypertension, Pt. on medications (-) angina+ CAD, + Past MI and +CHF   Rhythm:Regular Rate:Normal  HLD  Stress Test 10/17/19 1. Findings most consistent with LEFT ventricular apex infarction with peri-infarct ischemia most severe in the anterior wall but also involving the inferior wall. Overall large defect with moderate decreased perfusion. 2. Global hypokinesia.  Transient LEFT ventricular dilatation. 3. Left ventricular ejection fraction 40% 4. Non invasive risk stratification*: High Per Cardiology, defer LHC at this time 2/2 risks of further kidney injury outweigh benefits   TTE 10/15/19 EF 40-45%, regional wall motion abnormalities present, G1DD, RV function normal, LA severely dilated, moderate pleural effusions, mild MR   Neuro/Psych CVA (2017) negative psych ROS   GI/Hepatic negative GI ROS, Neg liver ROS,   Endo/Other  negative endocrine ROSdiabetes  Renal/GU Renal InsufficiencyRenal disease (Cr 3.52, K 4.0)     Musculoskeletal  (+) Arthritis ,   Abdominal   Peds  Hematology  (+) Blood dyscrasia (Hgb 8.3), anemia ,   Anesthesia Other Findings   Reproductive/Obstetrics  Anesthesia Physical Anesthesia Plan  ASA: IV  Anesthesia Plan: MAC   Post-op Pain Management:    Induction: Intravenous  PONV Risk Score and Plan: 1 and Treatment may vary due to age or medical condition and Propofol infusion  Airway Management Planned: Nasal Cannula  Additional Equipment: None  Intra-op Plan:   Post-operative Plan:   Informed Consent: I have reviewed the patients History and Physical, chart, labs and discussed the procedure including the risks, benefits and alternatives for the proposed anesthesia with the patient or authorized  representative who has indicated his/her understanding and acceptance.     Dental advisory given  Plan Discussed with: CRNA and Surgeon  Anesthesia Plan Comments:         Anesthesia Quick Evaluation  Anesthesia Physical  Anesthesia Plan  ASA: III  Anesthesia Plan: MAC   Post-op Pain Management:    Induction:   PONV Risk Score and Plan: 1 and Treatment may vary due to age or medical condition, Propofol infusion and TIVA  Airway Management Planned: Natural Airway and Mask  Additional Equipment: None  Intra-op Plan:   Post-operative Plan:   Informed Consent: I have reviewed the patients History and Physical, chart, labs and discussed the procedure including the risks, benefits and alternatives for the proposed anesthesia with the patient or authorized representative who has indicated his/her understanding and acceptance.       Plan Discussed with:   Anesthesia Plan Comments: (  TTE 02/18/2020: 1. Left ventricular ejection fraction, by estimation, is 60 to 65%. The  left ventricle has normal function. The left ventricle has no regional  wall motion abnormalities. Left ventricular diastolic parameters are  consistent with Grade II diastolic  dysfunction (pseudonormalization).  2. Right ventricular systolic function is normal. The right ventricular  size is normal.  3. Left atrial size was moderately dilated.  4. Right atrial size was mildly dilated.  5. The mitral valve is normal in structure. No evidence of mitral valve  regurgitation.  6. The aortic valve is normal in structure. Aortic valve regurgitation is  not visualized.   Carotid duplex 02/18/20: Summary:  Right Carotid: Velocities in the right ICA are consistent with a 1-39% stenosis.  Left Carotid: Velocities in the left ICA are consistent with a 1-39% stenosis.  Vertebrals: Bilateral vertebral arteries demonstrate antegrade flow.  Subclavians: Normal flow hemodynamics were seen in  bilateral subclavian arteries.   LHC 11/19/19: Mid LAD lesion is 75% stenosed. Ost LAD to Prox LAD lesion is 80% stenosed. In the cranial views, it is seen that the disease extends up to the ostial LAD, and there is significant calcification. LV end diastolic pressure is normal. There is no aortic valve stenosis.   PCI of the proximal - ostial LAD would likely require IVUS with possible atherectomy.  There is a mid to distal LAD lesion which is eccentric.    Given his lack of angina and issues with renal insufficiency, would not pursue PCI at this time.  Would reconsider based on his renal function.    )        Anesthesia Quick Evaluation

## 2020-08-02 ENCOUNTER — Encounter (HOSPITAL_COMMUNITY): Payer: Self-pay | Admitting: Internal Medicine

## 2020-08-02 ENCOUNTER — Inpatient Hospital Stay (HOSPITAL_COMMUNITY): Payer: Medicare PPO | Admitting: Anesthesiology

## 2020-08-02 ENCOUNTER — Ambulatory Visit (HOSPITAL_COMMUNITY): Admission: RE | Admit: 2020-08-02 | Payer: Medicare PPO | Source: Home / Self Care | Admitting: Surgery

## 2020-08-02 ENCOUNTER — Encounter (HOSPITAL_COMMUNITY)
Admission: EM | Disposition: A | Discharge status: Skilled Nursing Facility | Payer: Self-pay | Source: Home / Self Care | Attending: Internal Medicine

## 2020-08-02 DIAGNOSIS — K208 Other esophagitis without bleeding: Secondary | ICD-10-CM

## 2020-08-02 DIAGNOSIS — K921 Melena: Secondary | ICD-10-CM

## 2020-08-02 DIAGNOSIS — K219 Gastro-esophageal reflux disease without esophagitis: Secondary | ICD-10-CM | POA: Diagnosis present

## 2020-08-02 DIAGNOSIS — K2100 Gastro-esophageal reflux disease with esophagitis, without bleeding: Secondary | ICD-10-CM | POA: Diagnosis not present

## 2020-08-02 DIAGNOSIS — K449 Diaphragmatic hernia without obstruction or gangrene: Secondary | ICD-10-CM

## 2020-08-02 HISTORY — PX: ESOPHAGOGASTRODUODENOSCOPY (EGD) WITH PROPOFOL: SHX5813

## 2020-08-02 LAB — TYPE AND SCREEN
ABO/RH(D): AB POS
Antibody Screen: NEGATIVE
Unit division: 0
Unit division: 0

## 2020-08-02 LAB — BPAM RBC
Blood Product Expiration Date: 202202142359
Blood Product Expiration Date: 202202222359
ISSUE DATE / TIME: 202201221346
ISSUE DATE / TIME: 202201240553
Unit Type and Rh: 6200
Unit Type and Rh: 8400

## 2020-08-02 LAB — GLUCOSE, CAPILLARY
Glucose-Capillary: 136 mg/dL — ABNORMAL HIGH (ref 70–99)
Glucose-Capillary: 131 mg/dL — ABNORMAL HIGH (ref 70–99)
Glucose-Capillary: 151 mg/dL — ABNORMAL HIGH (ref 70–99)
Glucose-Capillary: 157 mg/dL — ABNORMAL HIGH (ref 70–99)

## 2020-08-02 LAB — CBC
HCT: 28.2 % — ABNORMAL LOW (ref 39.0–52.0)
Hemoglobin: 9.5 g/dL — ABNORMAL LOW (ref 13.0–17.0)
MCH: 29.8 pg (ref 26.0–34.0)
MCHC: 33.7 g/dL (ref 30.0–36.0)
MCV: 88.4 fL (ref 80.0–100.0)
Platelets: 271 10*3/uL (ref 150–400)
RBC: 3.19 MIL/uL — ABNORMAL LOW (ref 4.22–5.81)
RDW: 14.1 % (ref 11.5–15.5)
WBC: 10 10*3/uL (ref 4.0–10.5)
nRBC: 0 % (ref 0.0–0.2)

## 2020-08-02 LAB — BASIC METABOLIC PANEL
Anion gap: 16 — ABNORMAL HIGH (ref 5–15)
BUN: 58 mg/dL — ABNORMAL HIGH (ref 8–23)
CO2: 25 mmol/L (ref 22–32)
Calcium: 8.8 mg/dL — ABNORMAL LOW (ref 8.9–10.3)
Chloride: 94 mmol/L — ABNORMAL LOW (ref 98–111)
Creatinine, Ser: 4.71 mg/dL — ABNORMAL HIGH (ref 0.61–1.24)
GFR, Estimated: 13 mL/min — ABNORMAL LOW (ref >60)
Glucose, Bld: 129 mg/dL — ABNORMAL HIGH (ref 70–99)
Potassium: 3.6 mmol/L (ref 3.5–5.1)
Sodium: 135 mmol/L (ref 135–145)

## 2020-08-02 SURGERY — ABDOMINAL AORTOGRAM W/LOWER EXTREMITY
Anesthesia: LOCAL

## 2020-08-02 SURGERY — ESOPHAGOGASTRODUODENOSCOPY (EGD) WITH PROPOFOL
Anesthesia: Monitor Anesthesia Care

## 2020-08-02 MED ORDER — LACTATED RINGERS IV SOLN: INTRAVENOUS | Status: DC | PRN

## 2020-08-02 MED ORDER — SODIUM CHLORIDE 0.9 % IV SOLN
INTRAVENOUS | Status: AC | PRN
  Administered 2020-08-02: 1000 mL via INTRAVENOUS

## 2020-08-02 MED ORDER — PROPOFOL 500 MG/50ML IV EMUL
INTRAVENOUS | Status: DC | PRN
  Administered 2020-08-02: 125 ug/kg/min via INTRAVENOUS

## 2020-08-02 MED ORDER — PROPOFOL 10 MG/ML IV BOLUS
INTRAVENOUS | Status: DC | PRN
  Administered 2020-08-02: 30 mg via INTRAVENOUS

## 2020-08-02 MED ORDER — PHENYLEPHRINE 40 MCG/ML (10ML) SYRINGE FOR IV PUSH (FOR BLOOD PRESSURE SUPPORT)
PREFILLED_SYRINGE | INTRAVENOUS | Status: DC | PRN
  Administered 2020-08-02 (×2): 80 ug via INTRAVENOUS

## 2020-08-02 MED ORDER — PANTOPRAZOLE SODIUM 40 MG PO TBEC
40.0000 mg | DELAYED_RELEASE_TABLET | Freq: Every day | ORAL | Status: DC
  Administered 2020-08-03 – 2020-08-10 (×8): 40 mg via ORAL
  Filled 2020-08-02 (×8): qty 1

## 2020-08-02 SURGICAL SUPPLY — 15 items

## 2020-08-02 NOTE — Progress Notes (Signed)
Primary nurse Allison,RN reports pt. Will go for procedure today per surgeon. HD on hold for now. Anice Paganini, PA aware

## 2020-08-02 NOTE — Progress Notes (Signed)
[facility] Progress Note   Subjective:   Pt seen post EGD. Reports he is hungry but otherwise feeling well. Denies SOB, CP, palpitations, dizziness, abdominal pain and nausea. Notes reviewed, pt had bradycardic episode on HD yesterday that seems consistent with syncope, quickly recovered.   Objective Vitals:   08/02/20 0819 08/02/20 0943 08/02/20 0950 08/02/20 1000  BP: (!) 143/64 (!) 95/56 (!) 120/57 125/62  Pulse: 68  72 69  Resp: 18 16 20 18   Temp: 98.4 F (36.9 C) 98.3 F (36.8 C)    TempSrc: Temporal Oral    SpO2: 99% 98% 98% 98%  Weight: 71.4 kg     Height: 6' (1.829 m)      Physical Exam  General: Well developed male, alert and in NAD Heart: RRR, no murmur, rubs or gallops Lungs: CTA anteriorly without wheezing, rhonchi or rales Abdomen: Soft, non-tender, non-distended, +BS Extremities: No edema b/l lower extremities, R foot with ischemic second toe Dialysis Access: RUE AVF accessed   Additional Objective Labs: Basic Metabolic Panel: Recent Labs  Lab 07/30/20 0230 07/31/20 0058 08/01/20 0809 08/02/20 0535  NA 135 136 135 135  K 4.1 4.0 3.5 3.6  CL 96* 97* 98 94*  CO2 27 26 25 25   GLUCOSE 195* 160* 138* 129*  BUN 43* 73* 68* 58*  CREATININE 4.30* 5.22* 4.19* 4.71*  CALCIUM 8.6* 9.0 8.7* 8.8*  PHOS 2.9 3.1 2.3*  --    Liver Function Tests: Recent Labs  Lab 07/30/20 0230 07/31/20 0058 08/01/20 0809  ALBUMIN 2.6* 2.6* 2.5*   CBC: Recent Labs  Lab 07/28/20 1750 07/29/20 1455 07/30/20 0230 07/30/20 2023 07/31/20 0058 08/01/20 0235 08/01/20 1400 08/02/20 0535  WBC 9.5   < > 7.7 8.7 9.2 8.5  --  10.0  NEUTROABS 7.3  --  4.8  --   --   --   --   --   HGB 7.4*   < > 7.2* 8.6* 8.0* 7.0* 8.5* 9.5*  HCT 22.8*   < > 20.7* 25.2* 23.8* 21.3* 25.1* 28.2*  MCV 90.1   < > 90.0 88.4 88.8 90.3  --  88.4  PLT 206   < > 185 222 219 232  --  271   < > = values in this interval not displayed.   Blood Culture    Component Value Date/Time    SDES BLOOD LEFT HAND 04/02/2020 2049   SPECREQUEST  04/02/2020 2049    BOTTLES DRAWN AEROBIC ONLY Blood Culture adequate volume   CULT  04/02/2020 2049    NO GROWTH 5 DAYS Performed at [facility], [place] [address] [place], [place] [address]    REPTSTATUS 04/07/2020 FINAL 04/02/2020 2049    CBG: Recent Labs  Lab 08/01/20 1008 08/01/20 1339 08/01/20 1749 08/01/20 2132 08/02/20 0637  GLUCAP 132* 168* 222* 198* 136*   Medications:  . sodium chloride   Intravenous Once  . aspirin EC  81 mg Oral Daily  . atorvastatin  80 mg Oral QHS  . Chlorhexidine Gluconate Cloth  6 each Topical Q0600  . clopidogrel  75 mg Oral Daily  . docusate sodium  100 mg Oral Daily  . dorzolamide-timolol  1 drop Right Eye BID  . finasteride  5 mg Oral Daily  . insulin aspart  0-6 Units Subcutaneous TID WC  . insulin glargine  7 Units Subcutaneous QHS  . pantoprazole  40 mg Oral BID AC  . sevelamer carbonate  800 mg Oral 3 times per  day on Sun Mon Wed Fri   And  . sevelamer carbonate  800 mg Oral 2 times per day on Tue Thu Sat  . sodium chloride flush  3 mL Intravenous Q12H    Dialysis Orders:  TTS -[facility] 4hrs, BFR400, [id number], EDW 69kg,2K/2.25Ca Access:RU AVF Heparin4000 unit bolus Hectorol32mcg IV qHD   Assessment/Plan: 1. Syncopal episode -likely multifactorial - acute CVA, hypotension, and acute on chronic anemia. Had another episode of bradycardia/ syncope on HD yesterday per notes. Not volume overloaded. Will attempt HD today with no UF.  2. Acute CVA- seen on MRI. Thought to be related to cerebral hypoperfusion with hypotension post HD, as this was the cause of a past stroke. Per neuro/Admit 3. R 1st & 2nd digit ulcerations/Hx PVD: S/p RLE balloon angioplasty to PTA 05/29/20.VVS consulted, plan is outpatient f/u. 4. ESRD: Usually on TTS schedule, had dialysis yesterday as roll over. Resume TTS schedule with short HD today, no UF.  5. Hypertension/volume: BP  controlled, no volume overload on exam.No UF with HD today, allow weights to come up a bit. Keep SBP > 100. 6. Acute on chronic Anemiaof CKD:+ FOBT, Hgb drop 11.3 (on 1/13 as OP) to 7.4, s/p 1 unit pRBC1/20 and another unit on 1/24.GI consulted, underwent EGD today which showed esophagitis.  7. Secondary Hyperparathyroidism:Corrected calcium 10.1, Phos 2.3 yesterday. D/c binders for now.Hectorol already on hold, use low Ca bath.  8. Nutrition- Renal diet w/fluid restrictions.  9. DMT2   Anice Paganini, PA-C 08/02/2020, 10:57 AM  [facility] Pager: [phone]

## 2020-08-02 NOTE — Progress Notes (Signed)
PT Cancellation Note  Patient Details Name: Dennis Macias MRN: [id number] DOB: 11-May-1951   Cancelled Treatment:    Reason Eval/Treat Not Completed: Patient at procedure or test/unavailable. ESOPHAGOGASTRODUODENOSCOPY  Lyanne Co, DPT Acute Rehabilitation Services [phone]   [place] 08/02/2020, 9:37 AM

## 2020-08-02 NOTE — Anesthesia Postprocedure Evaluation (Signed)
Anesthesia Post Note  Patient: Dennis Macias  Procedure(s) Performed: ESOPHAGOGASTRODUODENOSCOPY (EGD) WITH PROPOFOL (N/A )     Patient location during evaluation: Endoscopy Anesthesia Type: MAC Level of consciousness: awake and sedated Pain management: pain level controlled Vital Signs Assessment: post-procedure vital signs reviewed and stable Cardiovascular status: stable Postop Assessment: no apparent nausea or vomiting Anesthetic complications: no   No complications documented.  Last Vitals:  Vitals:   08/02/20 0819 08/02/20 0943  BP: (!) 143/64 (!) 95/56  Pulse: 68   Resp: 18 16  Temp: 36.9 C 36.8 C  SpO2: 99% 98%    Last Pain:  Vitals:   08/02/20 0943  TempSrc: Oral  PainSc: 0-No pain                 Huston Foley

## 2020-08-02 NOTE — Anesthesia Procedure Notes (Signed)
Procedure Name: MAC Date/Time: 08/02/2020 9:13 AM Performed by: Reece Agar, CRNA Pre-anesthesia Checklist: Patient identified, Emergency Drugs available, Suction available and Patient being monitored Patient Re-evaluated:Patient Re-evaluated prior to induction Oxygen Delivery Method: Nasal cannula

## 2020-08-02 NOTE — Interval H&P Note (Signed)
History and Physical Interval Note:  08/02/2020 8:22 AM  Dennis Macias  has presented today for surgery, with the diagnosis of anemia, hemoccult positive stool.  The various methods of treatment have been discussed with the patient and family. After consideration of risks, benefits and other options for treatment, the patient has consented to  Procedure(s): ESOPHAGOGASTRODUODENOSCOPY (EGD) WITH PROPOFOL (N/A) as a surgical intervention.  The patient's history has been reviewed, patient examined, no change in status, stable for surgery.  I have reviewed the patient's chart and labs.  Questions were answered to the patient's satisfaction.     Scarlette Shorts

## 2020-08-02 NOTE — Op Note (Signed)
[facility] Patient Name: Dennis Macias Procedure Date : 08/02/2020 MRN: [id number] Attending MD: Docia Chuck. Henrene Pastor , MD Date of Birth: [birth year] CSN: [id number] Age: 70 Admit Type: Inpatient Procedure:                Upper GI endoscopy Indications:              Acute on chronic anemia, reports of dark stools. On                            chronic Plavix. End-stage renal disease on dialysis Providers:                Docia Chuck. Henrene Pastor, MD, Jobe Igo, RN, Lesia Sago, Technician Referring MD:             [facility] hospitalist Medicines:                Monitored Anesthesia Care Complications:            No immediate complications. Estimated Blood Loss:     Estimated blood loss: none. Procedure:                Pre-Anesthesia Assessment:                           - Prior to the procedure, a History and Physical                            was performed, and patient medications and                            allergies were reviewed. The patient's tolerance of                            previous anesthesia was also reviewed. The risks                            and benefits of the procedure and the sedation                            options and risks were discussed with the patient.                            All questions were answered, and informed consent                            was obtained. Prior Anticoagulants: The patient has                            taken Plavix (clopidogrel), last dose was 1 day                            prior to procedure. ASA Grade Assessment: III - A  patient with severe systemic disease. After                            reviewing the risks and benefits, the patient was                            deemed in satisfactory condition to undergo the                            procedure.                           After obtaining informed consent, the endoscope was                            passed  under direct vision. Throughout the                            procedure, the patient's blood pressure, pulse, and                            oxygen saturations were monitored continuously. The                            GIF-H190 ([id number]) Olympus gastroscope was                            introduced through the mouth, and advanced to the                            third part of duodenum. The upper GI endoscopy was                            accomplished without difficulty. The patient                            tolerated the procedure well. Scope In: Scope Out: Findings:      The esophagus revealed nonerosive esophagitis.      The stomach was normal save small hiatal hernia.      The examined duodenum was normal to the third portion.      The cardia and gastric fundus were normal on retroflexion. Impression:               1. Mild reflux esophagitis                           2. Otherwise normal EGD to D3.                           3. Multifactorial anemia. Recommendation:           1. Resume previous diet                           2. Continue current medications including Plavix  3. Recommend the patient stay on pantoprazole 40 mg                            daily indefinitely                           4. Resume general medical care with primary                            service. No outpatient GI follow-up required. We                            are available if needed. Findings discussed with                            patient who received a copy of this report. We will                            sign off.                           . Procedure Code(s):        --- Professional ---                           [phone], Esophagogastroduodenoscopy, flexible,                            transoral; diagnostic, including collection of                            specimen(s) by brushing or washing, when performed                            (separate procedure) Diagnosis  Code(s):        --- Professional ---                           D62, Acute posthemorrhagic anemia                           K92.1, Melena (includes Hematochezia) CPT copyright 2019 American Medical Association. All rights reserved. The codes documented in this report are preliminary and upon coder review may  be revised to meet current compliance requirements. Docia Chuck. Henrene Pastor, MD 08/02/2020 9:43:30 AM This report has been signed electronically. Number of Addenda: 0

## 2020-08-02 NOTE — Progress Notes (Signed)
Angiogram had to be canceled today due to surgical conflicts.  This potentially can be rescheduled later in the week however this should not delay the patient's discharge  Dennis Macias

## 2020-08-02 NOTE — Transfer of Care (Signed)
Immediate Anesthesia Transfer of Care Note  Patient: Dennis Macias  Procedure(s) Performed: ESOPHAGOGASTRODUODENOSCOPY (EGD) WITH PROPOFOL (N/A )  Patient Location: Endoscopy Unit  Anesthesia Type:MAC  Level of Consciousness: awake and alert   Airway & Oxygen Therapy: Patient Spontanous Breathing  Post-op Assessment: Report given to RN and Post -op Vital signs reviewed and stable  Post vital signs: Reviewed and stable  Last Vitals:  Vitals Value Taken Time  BP 95/56 08/02/20 0943  Temp 36.8 C 08/02/20 0943  Pulse 72 08/02/20 0945  Resp 15 08/02/20 0945  SpO2 98 % 08/02/20 0945  Vitals shown include unvalidated device data.  Last Pain:  Vitals:   08/02/20 0943  TempSrc: Oral  PainSc: 0-No pain         Complications: No complications documented.

## 2020-08-02 NOTE — Progress Notes (Signed)
Pt left for HD.

## 2020-08-02 NOTE — Progress Notes (Signed)
HD#4 Subjective:   No acute events overnight. Patient taken for EGD by GI this morning.  During evaluation at bedside this afternoon, patient states he is "fine, tired." Reports he tolerated the EGD fine. States he cannot recall the findings of the study. Discussed results. Patient denies lightheadedness. Denies any episodes of bleeding.  Objective:   Vital signs in last 24 hours: Vitals:   08/01/20 2046 08/01/20 2341 08/02/20 0311 08/02/20 0313  BP: 124/72 115/63 (!) 143/65   Pulse: 76 63 66   Resp: 20 18 20    Temp: (!) 97.3 F (36.3 C) 99.9 F (37.7 C) 98.2 F (36.8 C)   TempSrc: Oral Oral Oral   SpO2: 99% 100% 100%   Weight:    71.4 kg  Height:       Supplemental O2: Room Air SpO2: 100 %  Physical Exam Constitutional: chronically ill-appearing man lying bed, in no acute distress Cardiovascular: regular rate and rhythm, no m/r/g Pulmonary/Chest: normal work of breathing on room air, lungs clear to auscultation bilaterally MSK: slight build, L BKA Skin: RLE cool and dry. No progression of the dry gangrene of R great toe and second digit.   Pertinent Labs: CBC Latest Ref Rng & Units 08/02/2020 08/01/2020 08/01/2020  WBC 4.0 - 10.5 K/uL 10.0 - 8.5  Hemoglobin 13.0 - 17.0 g/dL 9.5(L) 8.5(L) 7.0(L)  Hematocrit 39.0 - 52.0 % 28.2(L) 25.1(L) 21.3(L)  Platelets 150 - 400 K/uL 271 - 232    CMP Latest Ref Rng & Units 08/02/2020 08/01/2020 07/31/2020  Glucose 70 - 99 mg/dL 129(H) 138(H) 160(H)  BUN 8 - 23 mg/dL 58(H) 68(H) 73(H)  Creatinine 0.61 - 1.24 mg/dL 4.71(H) 4.19(H) 5.22(H)  Sodium 135 - 145 mmol/L 135 135 136  Potassium 3.5 - 5.1 mmol/L 3.6 3.5 4.0  Chloride 98 - 111 mmol/L 94(L) 98 97(L)  CO2 22 - 32 mmol/L 25 25 26   Calcium 8.9 - 10.3 mg/dL 8.8(L) 8.7(L) 9.0  Total Protein 6.5 - 8.1 g/dL - - -  Total Bilirubin 0.3 - 1.2 mg/dL - - -  Alkaline Phos 38 - 126 U/L - - -  AST 15 - 41 U/L - - -  ALT 0 - 44 U/L - - -   Imaging: None  Assessment/Plan:   Active  Problems:   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 man with history of ESRD on HD TTS, type 2 diabetes mellitus, history of CVA, hypertension, heart failure, and peripheral vascular disease on dual antiplatelet therapy who presented after syncopal episode at dialysis and admitted for further work-up and evaluation.   This is hospital day 4.  Syncopal episode Post-HD hypotension Acute ischemic left thalamic infarct History of multiple prior CVAs (2017, 2021) HTN, HLD Patient had episode of bradycardia and syncope yesterday during HD, no further episodes since. Nephrology plan HD today with no UF.  - Continue home ASA and Plavix - Continue atorvastatin 80 mg   Acute on chronic anemia FOBT positive Nonerosive esophagitis S/p 1 u pRBC yesterday for hemoglobin of 7 ->  Hgb 9.5 this morning. EGD today revealed nonerosive esophagitis for which GI recommends pantoprozole 40 mg daily indefinitely. No outpatient GI follow-up required. - GI consulted and has now signed off following EGD, appreciate their recs  - Protonix 40 mg daily indefinitely  - No outpatient or GI follow-up required - AM CBC  R 1st and 2nd digit ulcerations History of Peripheral vascular disease s/p L BKA On dual antiplatelet platelet therapy due to PVD. RLE duplex and ABI obtained however unreliable due to noncompressible RLE arteries. Vascular consulted and plan for abdominal aortogram by Dr. Trula Slade today - Vascular surgery consulted, appreciate their expertise - NPO for abdominal aortogram today   ESRD on HD  (TTS) Nephrology consulted, appreciate recs and coordination of HD. RUE Fistula viable.  - HD today (1/25) as above  Type 2 diabetes mellitus Patient has been previously well-controlled off medications. Last A1c 6.4% in 02/2020, likely falsely low in s/o ESRD.  - Lantus 7 units - SSI sensitive - Carb/renal diet if not NPO   Diet: NPO IVF: None VTE: SCDs Code: Full  Please contact the on call pager after 5 pm and on weekends at [phone].  [name], MD PGY-1 Internal Medicine Teaching Service Pager: [phone] 08/02/2020

## 2020-08-02 NOTE — Progress Notes (Signed)
Pt back from HD, V/s table

## 2020-08-03 ENCOUNTER — Other Ambulatory Visit (HOSPITAL_COMMUNITY): Payer: Self-pay | Admitting: Student

## 2020-08-03 ENCOUNTER — Telehealth: Payer: Self-pay | Admitting: Internal Medicine

## 2020-08-03 DIAGNOSIS — R112 Nausea with vomiting, unspecified: Secondary | ICD-10-CM

## 2020-08-03 DIAGNOSIS — I132 Hypertensive heart and chronic kidney disease with heart failure and with stage 5 chronic kidney disease, or end stage renal disease: Secondary | ICD-10-CM

## 2020-08-03 DIAGNOSIS — I5043 Acute on chronic combined systolic (congestive) and diastolic (congestive) heart failure: Secondary | ICD-10-CM

## 2020-08-03 DIAGNOSIS — R509 Fever, unspecified: Secondary | ICD-10-CM

## 2020-08-03 LAB — CBC WITH DIFFERENTIAL/PLATELET
Abs Immature Granulocytes: 0.04 10*3/uL (ref 0.00–0.07)
Abs Immature Granulocytes: 0.07 10*3/uL (ref 0.00–0.07)
Basophils Absolute: 0 10*3/uL (ref 0.0–0.1)
Basophils Absolute: 0 10*3/uL (ref 0.0–0.1)
Basophils Relative: 0 %
Basophils Relative: 0 %
Eosinophils Absolute: 0 10*3/uL (ref 0.0–0.5)
Eosinophils Absolute: 0 10*3/uL (ref 0.0–0.5)
Eosinophils Relative: 0 %
Eosinophils Relative: 0 %
HCT: 24.9 % — ABNORMAL LOW (ref 39.0–52.0)
HCT: 28.7 % — ABNORMAL LOW (ref 39.0–52.0)
Hemoglobin: 8.2 g/dL — ABNORMAL LOW (ref 13.0–17.0)
Hemoglobin: 9.5 g/dL — ABNORMAL LOW (ref 13.0–17.0)
Immature Granulocytes: 1 %
Immature Granulocytes: 0 %
Lymphocytes Relative: 16 %
Lymphocytes Relative: 6 %
Lymphs Abs: 1.4 10*3/uL (ref 0.7–4.0)
Lymphs Abs: 1 10*3/uL (ref 0.7–4.0)
MCH: 29.9 pg (ref 26.0–34.0)
MCH: 30.4 pg (ref 26.0–34.0)
MCHC: 32.9 g/dL (ref 30.0–36.0)
MCHC: 33.1 g/dL (ref 30.0–36.0)
MCV: 90.9 fL (ref 80.0–100.0)
MCV: 91.7 fL (ref 80.0–100.0)
Monocytes Absolute: 0.8 10*3/uL (ref 0.1–1.0)
Monocytes Absolute: 1 10*3/uL (ref 0.1–1.0)
Monocytes Relative: 10 %
Monocytes Relative: 6 %
Neutro Abs: 6 10*3/uL (ref 1.7–7.7)
Neutro Abs: 13.7 10*3/uL — ABNORMAL HIGH (ref 1.7–7.7)
Neutrophils Relative %: 73 %
Neutrophils Relative %: 88 %
Platelets: 253 10*3/uL (ref 150–400)
Platelets: 323 10*3/uL (ref 150–400)
RBC: 2.74 MIL/uL — ABNORMAL LOW (ref 4.22–5.81)
RBC: 3.13 MIL/uL — ABNORMAL LOW (ref 4.22–5.81)
RDW: 14.4 % (ref 11.5–15.5)
RDW: 14.3 % (ref 11.5–15.5)
WBC: 8.2 10*3/uL (ref 4.0–10.5)
WBC: 15.9 10*3/uL — ABNORMAL HIGH (ref 4.0–10.5)
nRBC: 0 % (ref 0.0–0.2)
nRBC: 0 % (ref 0.0–0.2)

## 2020-08-03 LAB — RENAL FUNCTION PANEL
Albumin: 2.5 g/dL — ABNORMAL LOW (ref 3.5–5.0)
Anion gap: 11 (ref 5–15)
BUN: 23 mg/dL (ref 8–23)
CO2: 26 mmol/L (ref 22–32)
Calcium: 8 mg/dL — ABNORMAL LOW (ref 8.9–10.3)
Chloride: 99 mmol/L (ref 98–111)
Creatinine, Ser: 2.78 mg/dL — ABNORMAL HIGH (ref 0.61–1.24)
GFR, Estimated: 24 mL/min — ABNORMAL LOW (ref >60)
Glucose, Bld: 236 mg/dL — ABNORMAL HIGH (ref 70–99)
Phosphorus: 2.2 mg/dL — ABNORMAL LOW (ref 2.5–4.6)
Potassium: 4.2 mmol/L (ref 3.5–5.1)
Sodium: 136 mmol/L (ref 135–145)

## 2020-08-03 LAB — CBC
HCT: 24.8 % — ABNORMAL LOW (ref 39.0–52.0)
Hemoglobin: 8.2 g/dL — ABNORMAL LOW (ref 13.0–17.0)
MCH: 29.9 pg (ref 26.0–34.0)
MCHC: 33.1 g/dL (ref 30.0–36.0)
MCV: 90.5 fL (ref 80.0–100.0)
Platelets: 287 10*3/uL (ref 150–400)
RBC: 2.74 MIL/uL — ABNORMAL LOW (ref 4.22–5.81)
RDW: 14.3 % (ref 11.5–15.5)
WBC: 8.8 10*3/uL (ref 4.0–10.5)
nRBC: 0 % (ref 0.0–0.2)

## 2020-08-03 LAB — GLUCOSE, CAPILLARY
Glucose-Capillary: 205 mg/dL — ABNORMAL HIGH (ref 70–99)
Glucose-Capillary: 124 mg/dL — ABNORMAL HIGH (ref 70–99)
Glucose-Capillary: 144 mg/dL — ABNORMAL HIGH (ref 70–99)
Glucose-Capillary: 179 mg/dL — ABNORMAL HIGH (ref 70–99)

## 2020-08-03 LAB — SARS CORONAVIRUS 2 (TAT 6-24 HRS): SARS Coronavirus 2: NEGATIVE

## 2020-08-03 MED ORDER — ONDANSETRON HCL 4 MG/2ML IJ SOLN
4.0000 mg | Freq: Four times a day (QID) | INTRAMUSCULAR | Status: DC | PRN
  Administered 2020-08-03: 4 mg via INTRAVENOUS
  Filled 2020-08-03: qty 2

## 2020-08-03 MED ORDER — CHLORHEXIDINE GLUCONATE CLOTH 2 % EX PADS
6.0000 | MEDICATED_PAD | Freq: Every day | CUTANEOUS | Status: DC
  Administered 2020-08-04: 6 via TOPICAL

## 2020-08-03 MED ORDER — PANTOPRAZOLE SODIUM 40 MG PO TBEC: 40.0000 mg | DELAYED_RELEASE_TABLET | Freq: Every day | ORAL | 2 refills | Status: DC

## 2020-08-03 MED FILL — PANTOPRAZOLE SOD DR 40 MG T: 40 | 30 days supply | Qty: 30 | Fill #0

## 2020-08-03 NOTE — Progress Notes (Signed)
[facility] Progress Note   Subjective:   Pt seen in room, reports bilious emesis x1 this AM. No nausea or abdominal pain at present. Denies SOB, CP, palpitations, dizziness. Feels ok other than being hungry. Currently NPO, vascular procedure postponed yesterday.  Objective Vitals:   08/03/20 0402 08/03/20 0500 08/03/20 0749 08/03/20 1202  BP: 108/68  119/67 (!) 141/70  Pulse: 73  68 69  Resp: 18  16 14   Temp: (!) 100.6 F (38.1 C) 99.4 F (37.4 C) 100 F (37.8 C) (!) 100.8 F (38.2 C)  TempSrc: Oral Oral Oral Oral  SpO2: 100%  100% 100%  Weight:  68.6 kg    Height:       Physical Exam General:Well developed male, alert and in NAD Heart:RRR, no murmur, rubs or gallops Lungs:CTA anteriorly without wheezing, rhonchi or rales Abdomen:Soft, non-tender, non-distended, +BS Extremities:No edema b/l lower extremities, R foot with ischemic second toe Dialysis Access:RUE AVF + bruit   Additional Objective Labs: Basic Metabolic Panel: Recent Labs  Lab 07/31/20 0058 08/01/20 0809 08/02/20 0535 08/03/20 0043  NA 136 135 135 136  K 4.0 3.5 3.6 4.2  CL 97* 98 94* 99  CO2 26 25 25 26   GLUCOSE 160* 138* 129* 236*  BUN 73* 68* 58* 23  CREATININE 5.22* 4.19* 4.71* 2.78*  CALCIUM 9.0 8.7* 8.8* 8.0*  PHOS 3.1 2.3*  --  2.2*   Liver Function Tests: Recent Labs  Lab 07/31/20 0058 08/01/20 0809 08/03/20 0043  ALBUMIN 2.6* 2.5* 2.5*   No results for input(s): LIPASE, AMYLASE in the last 168 hours. CBC: Recent Labs  Lab 07/28/20 1750 07/29/20 1455 07/30/20 0230 07/30/20 2023 07/31/20 0058 08/01/20 0235 08/01/20 1400 08/02/20 0535 08/03/20 0043 08/03/20 1129  WBC 9.5   < > 7.7   < > 9.2 8.5  --  10.0 8.2 8.8  NEUTROABS 7.3  --  4.8  --   --   --   --   --  6.0  --   HGB 7.4*   < > 7.2*   < > 8.0* 7.0*   < > 9.5* 8.2* 8.2*  HCT 22.8*   < > 20.7*   < > 23.8* 21.3*   < > 28.2* 24.9* 24.8*  MCV 90.1   < > 90.0   < > 88.8 90.3  --  88.4 90.9 90.5   PLT 206   < > 185   < > 219 232  --  271 253 287   < > = values in this interval not displayed.   Blood Culture    Component Value Date/Time   SDES BLOOD LEFT HAND 04/02/2020 2049   SPECREQUEST  04/02/2020 2049    BOTTLES DRAWN AEROBIC ONLY Blood Culture adequate volume   CULT  04/02/2020 2049    NO GROWTH 5 DAYS Performed at [facility], [place] [address] [place], [place] [address]    REPTSTATUS 04/07/2020 FINAL 04/02/2020 2049    CBG: Recent Labs  Lab 08/02/20 1225 08/02/20 1653 08/02/20 2233 08/03/20 0623 08/03/20 1201  GLUCAP 131* 151* 157* 205* 124*   Medications:  . sodium chloride   Intravenous Once  . aspirin EC  81 mg Oral Daily  . atorvastatin  80 mg Oral QHS  . Chlorhexidine Gluconate Cloth  6 each Topical Q0600  . clopidogrel  75 mg Oral Daily  . docusate sodium  100 mg Oral Daily  . dorzolamide-timolol  1 drop Right Eye BID  . finasteride  5 mg Oral Daily  . insulin aspart  0-6 Units Subcutaneous TID WC  . insulin glargine  7 Units Subcutaneous QHS  . pantoprazole  40 mg Oral Daily  . sodium chloride flush  3 mL Intravenous Q12H    Dialysis Orders: TTS -[facility] 4hrs, BFR400, [id number], EDW 69kg,2K/2.25Ca Access:RU AVF Heparin4000 unit bolus Hectorol59mcg IV qHD  Assessment/Plan: 1. Syncopal episode -likely multifactorial - acute CVA, hypotension, and acute on chronic anemia. Had another episode of bradycardia/ syncope on HD Monday. Not volume overloaded.Did well on HD yesterday with no UF. 2. Acute CVA- seen on MRI. Thought to be related to cerebral hypoperfusion with hypotension post HD, as this was the cause of a past stroke. Per neuro/Admit 3. R 1st & 2nd digit ulcerations/Hx PVD: S/p RLE balloon angioplasty to PTA 05/29/20.VVS consulted, plan for arteriogram 4. ESRD: Continue TTS schedule. HD treatments shortened due to high patient census/staffing shortage. Not uremic or volume overloaded on exam.  5. Hypertension/volume:  BP controlled, no volume overload on exam.Slightly below EDW.  6. Acute on chronic Anemiaof CKD:+ FOBT, Hgb drop 11.3 (on 1/13 as OP) to 7.4, s/p 1 unit pRBC1/20 and another unit on 1/24.GI consulted, underwent EGD 1/25 which showed esophagitis. Hgb now 8.2.  7. Secondary Hyperparathyroidism:Corrected calcium 9.2, has been running high,Phos 2.2. Binders discontinued yesterday .Hectorol on hold, follow Ca.  8. Nutrition- Renal diet w/fluid restrictions.  9. DMT2   Anice Paganini, PA-C 08/03/2020, 12:44 PM  [facility] Pager: [phone]

## 2020-08-03 NOTE — Progress Notes (Signed)
   Subjective:   Overnight, Tmax of 100.6. Vitals otherwise stable. Patient seen and evaluated at bedside this morning. He states that he is feeling "good." Notes he is hungry.   Later during the day spiked a fever with Tmax 100.8. Evaluated at bedside, denied infectious symptoms such as cough, congestion, shortness of breath, sore throat. Vitals stable without tachycardia or hypotension. AM CBC without leukocytosis. 07/28/20 CXR clear.  Out of an abundance of caution, obtained blood cultures x 2 and re-swabbed for COVID. Felt patient stable for discharge home, planned to contact patient with results.   At shift change, IMTS paged about nausea and vomiting with shaking hands, with patient stating that these symptoms are caused by him being NPO. Given his fevers throughout the day (on Tylenol). Patient is not tachycardic. If no symptoms throughout the night, planned for DC in the morning.  Objective:  Physical Exam:   BP (!) 141/70 (BP Location: Left Arm)   Pulse 69   Temp (!) 100.8 F (38.2 C) (Oral) Comment (Src): RN  notified  Resp 14   Ht 6' (1.829 m)   Wt 68.6 kg   SpO2 100%   BMI 20.51 kg/m  Constitutional: chronically ill-appearing man lying bed, in no acute distress Cardiovascular: regular rate and rhythm, no m/r/g Pulmonary/Chest: normal work of breathing on room air, lungs clear to auscultation bilaterally MSK: slight build, L BKA  Skin: RLE cool and dry. No progression of the dry gangrene of R great toe and second digit.   Assessment/Plan:  Active Problems:   Diabetes mellitus with retinopathy of both eyes (Dennis Macias)   Hyperlipidemia   Acute on chronic anemia   Chronic combined systolic and diastolic heart failure (HCC)   MSSA bacteremia   ESRD (end stage renal disease) ([place])   Acute cerebrovascular accident (CVA) due to ischemia (HCC)   PAD (peripheral artery disease) (HCC)   Orthostatic hypotension   S/P BKA (below knee amputation) ([place])   Syncope   Stroke ([place])    Dark stools   Gastroesophageal reflux disease with esophagitis   Nonerosive esophageal reflux disease   Hiatal hernia with GERD and esophagitis  Dennis Macias is a 70 y.o. man with history of ESRD on HD TTS, type 2 diabetes mellitus, history of CVA, hypertension, heart failure, and peripheral vascular disease on dual antiplatelet therapy who presented after syncopal episode at dialysis and admitted for further work-up and evaluation.   This is hospital day 6.  Due to new fever with systemic symptoms (chills), will cancel discharge.  - f/u blood cultures - f/u repeat COVID swab    Dennis Lodge, MD 08/04/2020, 1:06 PM Pager: [phone] After 5pm on weekdays and 1pm on weekends: On Call pager [phone]

## 2020-08-03 NOTE — Progress Notes (Signed)
Occupational Therapy Treatment Patient Details Name: Daylon Lafavor MRN: [id number] DOB: Apr 13, 1951 Today's Date: 08/03/2020    History of present illness Pt is a 70 y/o male with PMH of L BKA, s/p balloon angioplasty of occluded vessels R foot 11/21, L ICA CVA (2017), NIDDM type II with retinopathy, CKD on hemodialysis, nephrotic syndrome, combined systolic and diastolic CHF, CAD w/ NSTEMI, HTN, HLD, tubular adenmoa of colon, tachycardia, syncope, PVD, and protein malnutrition who presents after a syncopal episode at hemodialysis. His R foot wounds continue to worsen with noted dry gangrene on 1st and 2nd toes. MRI revealed acute ischemic nonhemorrhagic L thalamic infarct and multiple chronic micro hemorrhages scattered throughout both cerebral hemispheres.   OT comments  Pt initially with improved willingness for participation with OT this date; demos decreased insight to limitations and safety concerns repeatedly stating 'my son and grandson can help me and so can my girlfriend'. Case management/SW present to confirm that girlfriend works during the day and is unable to provide 24hr S/A. OT facilitated pt led transfer from bed>w/c, pt with decreased verbalizations to guide therapist on placement, swing away arm rests or technique for transfers. Pt requiring up to mod A to correct attempt at transfer with buttocks nearly sliding off EOB pt unaware. Pt presents with decreased balance, activity tolerance and strength continuing to limit indep and safety with ADL's and transfers and lead to the need for 24hr s/A, strong recommendation for post acute therapy if 24hr s/A is unavailable with pt's preference for d/c to home.    Follow Up Recommendations  SNF;Supervision/Assistance - 24 hour;Home health OT    Equipment Recommendations  None recommended by OT    Recommendations for Other Services      Precautions / Restrictions Precautions Precautions: Fall Precaution Comments: hx of L BKA; gangrene R  foot; HOH Restrictions Weight Bearing Restrictions: No       Mobility Bed Mobility Overal bed mobility: Needs Assistance Bed Mobility: Supine to Sit;Sit to Supine     Supine to sit: Min guard Sit to supine: Supervision   General bed mobility comments: impulsive movements requiring close SBA for safety  Transfers Overall transfer level: Needs assistance Equipment used: 1 person hand held assist Transfers: Squat Pivot Transfers     Squat pivot transfers: Mod assist     General transfer comment: deferred full OB as pt unable to safely transfer to w/c without up to mod A provided by therapist.    Balance Overall balance assessment: Needs assistance Sitting-balance support: No upper extremity supported;Feet supported Sitting balance-Leahy Scale: Fair     Standing balance support:  trialed one transition from sitting to standing at EOB unsuccessfully achieving stand.                                ADL either performed or assessed with clinical judgement   ADL                       Lower Body Dressing: Moderate assistance;Bed level;Sit to/from stand   Toilet Transfer: Moderate assistance;Squat-pivot   Toileting- Clothing Manipulation and Hygiene: Maximal assistance         General ADL Comments: trialed pt lead transfer to w/c with drop arm; unable to indep complete transfer and requiring increasd time and cues for sequencing of task. deferred OOB d/t near slide off bed/w/c with decreased insight to deficits and safety     Vision  Perception     Praxis      Cognition Arousal/Alertness: Awake/alert Behavior During Therapy: Agitated Overall Cognitive Status: No family/caregiver present to determine baseline cognitive functioning                                 General Comments: Ox3, demos decreased insight to safety        Exercises     Shoulder Instructions       General Comments VSS on RA    Pertinent  Vitals/ Pain       Pain Assessment: No/denies pain Faces Pain Scale: Hurts little more Pain Location: LUE Pain Descriptors / Indicators:  ("hurts", unable to describe further) Pain Intervention(s): Monitored during session  Home Living                                          Prior Functioning/Environment              Frequency  Min 2X/week        Progress Toward Goals  OT Goals(current goals can now be found in the care plan section)  Progress towards OT goals: Progressing toward goals  Acute Rehab OT Goals Patient Stated Goal: to go home today Time For Goal Achievement: 08/14/20 Potential to Achieve Goals: Good  Plan Discharge plan remains appropriate    Co-evaluation                 AM-PAC OT "6 Clicks" Daily Activity     Outcome Measure   Help from another person eating meals?: None Help from another person taking care of personal grooming?: A Little Help from another person toileting, which includes using toliet, bedpan, or urinal?: A Lot Help from another person bathing (including washing, rinsing, drying)?: A Little Help from another person to put on and taking off regular upper body clothing?: A Little Help from another person to put on and taking off regular lower body clothing?: A Lot 6 Click Score: 17    End of Session Equipment Utilized During Treatment: Gait belt  OT Visit Diagnosis: Unsteadiness on feet (R26.81)   Activity Tolerance Patient limited by fatigue   Patient Left in bed;with call bell/phone within reach   Nurse Communication Mobility status        Time: 6761-9509 OT Time Calculation (min): 20 min  Charges: OT General Charges $OT Visit: 1 Visit OT Treatments $Self Care/Home Management : 8-22 mins  Braeden Kennan OTR/L acute rehab services Office: [phone]  Toula Moos Reginald Weida 08/03/2020, 3:22 PM

## 2020-08-03 NOTE — Progress Notes (Signed)
Physical Therapy Treatment Patient Details Name: Dennis Macias MRN: [id number] DOB: [birth year] Today's Date: 08/03/2020    History of Present Illness Pt is a 70 y/o male with PMH of L BKA, s/p balloon angioplasty of occluded vessels R foot 11/21, L ICA CVA (2017), NIDDM type II with retinopathy, CKD on hemodialysis, nephrotic syndrome, combined systolic and diastolic CHF, CAD w/ NSTEMI, HTN, HLD, tubular adenmoa of colon, tachycardia, syncope, PVD, and protein malnutrition who presents after a syncopal episode at hemodialysis. His R foot wounds continue to worsen with noted dry gangrene on 1st and 2nd toes. MRI revealed acute ischemic nonhemorrhagic L thalamic infarct and multiple chronic micro hemorrhages scattered throughout both cerebral hemispheres.    PT Comments    Pt reluctant to participate in PT session, possibly effecting performance. Pt focused on being NPO for multiple days and very upset. Pt requires physical assistance to transfer out of bed at this time and seems to have some limitations in awareness of his current strength and functional mobility deficits. Pt is at an increase risk for falls currently due to weakness and will benefit from aggressive mobilization and acute PT POC to reduce falls risk. PT updates recommendations to SNF placement as the pt is now requiring increased physical assistance. If pt is able to eat and is more agreeable to participation in PT session his performance may improve and he may be more appropriate for discharge home, however his performance from today's session demonstrates regression from previous sessions during this admission.  Follow Up Recommendations  SNF;Supervision/Assistance - 24 hour (HHPT if pt refuses rehab placement)     Equipment Recommendations  None recommended by PT    Recommendations for Other Services       Precautions / Restrictions Precautions Precautions: Fall Precaution Comments: hx of L BKA; gangrene R foot;  HOH Restrictions Weight Bearing Restrictions: No    Mobility  Bed Mobility Overal bed mobility: Needs Assistance Bed Mobility: Supine to Sit;Sit to Supine     Supine to sit: Supervision Sit to supine: Supervision      Transfers Overall transfer level: Needs assistance Equipment used: 1 person hand held assist Transfers: Squat Pivot Transfers     Squat pivot transfers: Mod assist     General transfer comment: BUE suppoert of PT due to weakness to maintain balance and successfully reach recliner  Ambulation/Gait                 Stairs             Wheelchair Mobility    Modified Rankin (Stroke Patients Only) Modified Rankin (Stroke Patients Only) Pre-Morbid Rankin Score: Severe disability Modified Rankin: Severe disability     Balance Overall balance assessment: Needs assistance Sitting-balance support: No upper extremity supported;Feet supported Sitting balance-Leahy Scale: Fair     Standing balance support:  (pt declines attempts at standing)                                Cognition Arousal/Alertness: Awake/alert Behavior During Therapy: Agitated Overall Cognitive Status: No family/caregiver present to determine baseline cognitive functioning                                 General Comments: pt is alert and oriented, reluctant to participate. Requires PT education on importance of mobility but remains distracted on the fact that he has been NPO  for 2 days now      Exercises      General Comments General comments (skin integrity, edema, etc.): VSS on RA      Pertinent Vitals/Pain Pain Assessment: Faces Faces Pain Scale: Hurts little more Pain Location: LUE Pain Descriptors / Indicators:  ("hurts", unable to describe further) Pain Intervention(s): Monitored during session    Home Living                      Prior Function            PT Goals (current goals can now be found in the care plan  section) Acute Rehab PT Goals Patient Stated Goal: to go home Progress towards PT goals: Not progressing toward goals - comment (agitated, increased assistance requirements)    Frequency    Min 3X/week      PT Plan Discharge plan needs to be updated    Co-evaluation              AM-PAC PT "6 Clicks" Mobility   Outcome Measure  Help needed turning from your back to your side while in a flat bed without using bedrails?: A Little Help needed moving from lying on your back to sitting on the side of a flat bed without using bedrails?: A Little Help needed moving to and from a bed to a chair (including a wheelchair)?: A Lot Help needed standing up from a chair using your arms (e.g., wheelchair or bedside chair)?: A Lot Help needed to walk in hospital room?: Total Help needed climbing 3-5 steps with a railing? : Total 6 Click Score: 12    End of Session   Activity Tolerance: Treatment limited secondary to agitation Patient left: in chair;with call bell/phone within reach;with chair alarm set Nurse Communication: Mobility status PT Visit Diagnosis: Unsteadiness on feet (R26.81);Muscle weakness (generalized) (M62.81);Difficulty in walking, not elsewhere classified (R26.2);Other symptoms and signs involving the nervous system (R29.898)     Time: 1610-9604 PT Time Calculation (min) (ACUTE ONLY): 21 min  Charges:  $Therapeutic Activity: 8-22 mins                     Zenaida Niece, PT, DPT Acute Rehabilitation Pager: [phone]    Zenaida Niece 08/03/2020, 2:08 PM

## 2020-08-03 NOTE — Telephone Encounter (Signed)
TOC HFU APPT [facility] 08/10/2020 @ 3:15 PM WITH DR CHEN PER DR Shon Baton

## 2020-08-03 NOTE — Progress Notes (Signed)
  Progress Note    08/03/2020 7:33 AM Hospital Day 5  Subjective:  Sleeping-wakes easily.  No complaints.  Tm 99.5 now 99.4  Vitals:   08/03/20 0402 08/03/20 0500  BP: 108/68   Pulse: 73   Resp: 18   Temp: (!) 100.6 F (38.1 C) 99.4 F (37.4 C)  SpO2: 100%     Physical Exam: General:  No distress; resting comfortably Lungs:  Non labored Extremities:  Right foot appears unchanged from picture on 1/20.  Warm.  CBC    Component Value Date/Time   WBC 8.2 08/03/2020 0043   RBC 2.74 (L) 08/03/2020 0043   HGB 8.2 (L) 08/03/2020 0043   HGB 11.1 (L) 05/02/2020 1454   HCT 24.9 (L) 08/03/2020 0043   HCT 35.5 (L) 05/02/2020 1454   PLT 253 08/03/2020 0043   PLT 221 05/02/2020 1454   MCV 90.9 08/03/2020 0043   MCV 86 05/02/2020 1454   MCH 29.9 08/03/2020 0043   MCHC 32.9 08/03/2020 0043   RDW 14.4 08/03/2020 0043   RDW 15.7 (H) 05/02/2020 1454   LYMPHSABS 1.4 08/03/2020 0043   LYMPHSABS 1.3 05/02/2020 1454   MONOABS 0.8 08/03/2020 0043   EOSABS 0.0 08/03/2020 0043   EOSABS 0.0 05/02/2020 1454   BASOSABS 0.0 08/03/2020 0043   BASOSABS 0.0 05/02/2020 1454    BMET    Component Value Date/Time   NA 136 08/03/2020 0043   NA 137 12/10/2019 1439   K 4.2 08/03/2020 0043   CL 99 08/03/2020 0043   CO2 26 08/03/2020 0043   GLUCOSE 236 (H) 08/03/2020 0043   BUN 23 08/03/2020 0043   BUN 80 (HH) 12/10/2019 1439   CREATININE 2.78 (H) 08/03/2020 0043   CALCIUM 8.0 (L) 08/03/2020 0043   GFRNONAA 24 (L) 08/03/2020 0043   GFRAA 19 (L) 04/05/2020 0705    INR    Component Value Date/Time   INR 1.6 (H) 03/31/2020 0547     Intake/Output Summary (Last 24 hours) at 08/03/2020 0733 Last data filed at 08/02/2020 2200 Gross per 24 hour  Intake 437 ml  Output 0 ml  Net 437 ml  f   Assessment/Plan:  70 y.o. male with gangrene toes right foot Hospital Day 5  -wounds right foot appear unchanged from picture on 1/20.   -pt was scheduled for arteriogram yesterday but was  cancelled due to surgical conflicts.  Dr. Trula Slade to determine timing.  -EGD yesterday revealed mild reflux esophagitis. Recommended for PPI daily. -acute blood loss anemia-hgb down today to 8.2 from 9.5 yesterday. Received PRBC's 1/24.   Leontine Locket, PA-C [facility] [phone] 08/03/2020 7:33 AM

## 2020-08-03 NOTE — Discharge Summary (Deleted)
   Subjective:   Overnight, Tmax of 100.6. Vitals otherwise stable. Patient seen and evaluated at bedside this morning. He states that he is feeling "good." Notes he is hungry.   Later during the day spiked a fever with Tmax 100.8. Evaluated at bedside, denied infectious symptoms such as cough, congestion, shortness of breath, sore throat. Vitals stable without tachycardia or hypotension. AM CBC without leukocytosis. 07/28/20 CXR clear.  Out of an abundance of caution, obtained blood cultures x 2 and re-swabbed for COVID. Felt patient stable for discharge home, planned to contact patient with results.   At shift change, IMTS paged about nausea and vomiting with shaking hands, with patient stating that these symptoms are caused by him being NPO. Given his fevers throughout the day (on Tylenol). Patient is not tachycardic. If no symptoms throughout the night, planned for DC in the morning.  Objective:  Physical Exam:   BP (!) 141/70 (BP Location: Left Arm)   Pulse 69   Temp (!) 100.8 F (38.2 C) (Oral) Comment (Src): RN  notified  Resp 14   Ht 6' (1.829 m)   Wt 68.6 kg   SpO2 100%   BMI 20.51 kg/m  Constitutional: chronically ill-appearing man lying bed, in no acute distress Cardiovascular: regular rate and rhythm, no m/r/g Pulmonary/Chest: normal work of breathing on room air, lungs clear to auscultation bilaterally MSK: slight build, L BKA  Skin: RLE cool and dry. No progression of the dry gangrene of R great toe and second digit.   Assessment/Plan:  Active Problems:   Diabetes mellitus with retinopathy of both eyes ([place])   Hyperlipidemia   Acute on chronic anemia   Chronic combined systolic and diastolic heart failure (HCC)   MSSA bacteremia   ESRD (end stage renal disease) ([place])   Acute cerebrovascular accident (CVA) due to ischemia (HCC)   PAD (peripheral artery disease) (HCC)   Orthostatic hypotension   S/P BKA (below knee amputation) ([place])   Syncope   Stroke ([place])    Dark stools   Gastroesophageal reflux disease with esophagitis   Nonerosive esophageal reflux disease   Hiatal hernia with GERD and esophagitis  Dennis Macias is a 70 y.o. man with history of ESRD on HD TTS, type 2 diabetes mellitus, history of CVA, hypertension, heart failure, and peripheral vascular disease on dual antiplatelet therapy who presented after syncopal episode at dialysis and admitted for further work-up and evaluation.   This is hospital day 6.  Due to new fever with systemic symptoms (chills), will cancel discharge.  - f/u blood cultures - f/u repeat COVID swab    [name], MD 08/04/2020, 1:06 PM Pager: [phone] After 5pm on weekdays and 1pm on weekends: On Call pager [phone]

## 2020-08-03 NOTE — TOC Transition Note (Signed)
Transition of Care [facility]) - CM/SW Discharge Note   Patient Details  Name: Shedric Fredericks MRN: [id number] Date of Birth: 02/19/1951  [facility] CM/SW Contact:  Pollie Friar, RN Phone Number: 08/03/2020, 3:53 PM   Clinical Narrative:    Pt lives at home with his significant other. She works so pt does have times he is alone. Pt states his son and grandson can provide some supervision.  Pt was active with [name] for [facility] services prior to admission. He wants to continue with their services. CM has updated [place] with [place] of resumption orders. Pt has all needed DME at home. Pts significant other to provide transport home.   Final next level of care: Home w Home Health Services Barriers to Discharge: No Barriers Identified   Patient Goals and CMS Choice   CMS Medicare.gov Compare Post Acute Care list provided to:: Patient Choice offered to / list presented to : Spouse,Patient  Discharge Placement                       Discharge Plan and Services                          HH Arranged: PT,OT [place]: [place] Date [place]: 08/03/20   Representative spoke with at [place]: [place] ([place]) Interventions     Readmission Risk Interventions Readmission Risk Prevention Plan 01/19/2020 11/10/2019 08/10/2019  Transportation Screening Complete Complete Complete  PCP or Specialist Appt within 3-5 Days - - Complete  HRI or [place] - - Complete  Social Work Consult for [place] Planning/Counseling - - Complete  Palliative Care Screening - - Complete  Medication Review Press photographer) Complete Complete Complete  PCP or Specialist appointment within 3-5 days of discharge Complete - -  [place] or Home Care Consult Complete Complete -  SW Recovery Care/Counseling Consult Complete Complete -  Palliative Care Screening Complete Not Applicable -  [place] Complete Complete -  Some  recent data might be hidden

## 2020-08-03 NOTE — Progress Notes (Signed)
OT Cancellation Note  Patient Details Name: Dennis Macias MRN: [id number] DOB: 09-20-1950   Cancelled Treatment:    Reason Eval/Treat Not Completed: Other (comment) patient with PT at this time. OT to check back as time allows.   Gloris [place] OTR/L Supplemental OT, Department of rehab services [phone]  Jaken Fregia R H. 08/03/2020, 1:47 PM

## 2020-08-04 ENCOUNTER — Encounter (HOSPITAL_COMMUNITY): Payer: Self-pay | Admitting: Internal Medicine

## 2020-08-04 ENCOUNTER — Other Ambulatory Visit: Payer: Self-pay

## 2020-08-04 ENCOUNTER — Inpatient Hospital Stay (HOSPITAL_COMMUNITY): Payer: Medicare PPO

## 2020-08-04 ENCOUNTER — Other Ambulatory Visit (HOSPITAL_COMMUNITY): Payer: Medicare PPO

## 2020-08-04 DIAGNOSIS — R5081 Fever presenting with conditions classified elsewhere: Secondary | ICD-10-CM

## 2020-08-04 DIAGNOSIS — R7881 Bacteremia: Secondary | ICD-10-CM | POA: Diagnosis not present

## 2020-08-04 DIAGNOSIS — I639 Cerebral infarction, unspecified: Secondary | ICD-10-CM | POA: Diagnosis not present

## 2020-08-04 DIAGNOSIS — K219 Gastro-esophageal reflux disease without esophagitis: Secondary | ICD-10-CM

## 2020-08-04 DIAGNOSIS — R509 Fever, unspecified: Secondary | ICD-10-CM

## 2020-08-04 DIAGNOSIS — I739 Peripheral vascular disease, unspecified: Secondary | ICD-10-CM | POA: Diagnosis not present

## 2020-08-04 DIAGNOSIS — I5042 Chronic combined systolic (congestive) and diastolic (congestive) heart failure: Secondary | ICD-10-CM | POA: Diagnosis not present

## 2020-08-04 DIAGNOSIS — I951 Orthostatic hypotension: Secondary | ICD-10-CM

## 2020-08-04 DIAGNOSIS — I7025 Atherosclerosis of native arteries of other extremities with ulceration: Secondary | ICD-10-CM

## 2020-08-04 DIAGNOSIS — B9561 Methicillin susceptible Staphylococcus aureus infection as the cause of diseases classified elsewhere: Secondary | ICD-10-CM

## 2020-08-04 DIAGNOSIS — D649 Anemia, unspecified: Secondary | ICD-10-CM | POA: Diagnosis not present

## 2020-08-04 DIAGNOSIS — Z89512 Acquired absence of left leg below knee: Secondary | ICD-10-CM

## 2020-08-04 LAB — CBC
HCT: 25 % — ABNORMAL LOW (ref 39.0–52.0)
HCT: 24.6 % — ABNORMAL LOW (ref 39.0–52.0)
Hemoglobin: 8.4 g/dL — ABNORMAL LOW (ref 13.0–17.0)
Hemoglobin: 8.3 g/dL — ABNORMAL LOW (ref 13.0–17.0)
MCH: 30.4 pg (ref 26.0–34.0)
MCH: 30.5 pg (ref 26.0–34.0)
MCHC: 33.6 g/dL (ref 30.0–36.0)
MCHC: 33.7 g/dL (ref 30.0–36.0)
MCV: 90.6 fL (ref 80.0–100.0)
MCV: 90.4 fL (ref 80.0–100.0)
Platelets: 319 10*3/uL (ref 150–400)
Platelets: 315 10*3/uL (ref 150–400)
RBC: 2.76 MIL/uL — ABNORMAL LOW (ref 4.22–5.81)
RBC: 2.72 MIL/uL — ABNORMAL LOW (ref 4.22–5.81)
RDW: 14.2 % (ref 11.5–15.5)
RDW: 14.3 % (ref 11.5–15.5)
WBC: 13.9 10*3/uL — ABNORMAL HIGH (ref 4.0–10.5)
WBC: 11.1 10*3/uL — ABNORMAL HIGH (ref 4.0–10.5)
nRBC: 0 % (ref 0.0–0.2)
nRBC: 0 % (ref 0.0–0.2)

## 2020-08-04 LAB — BLOOD CULTURE ID PANEL (REFLEXED) - BCID2

## 2020-08-04 LAB — RENAL FUNCTION PANEL
Albumin: 2.4 g/dL — ABNORMAL LOW (ref 3.5–5.0)
Anion gap: 14 (ref 5–15)
BUN: 24 mg/dL — ABNORMAL HIGH (ref 8–23)
CO2: 25 mmol/L (ref 22–32)
Calcium: 8.6 mg/dL — ABNORMAL LOW (ref 8.9–10.3)
Chloride: 98 mmol/L (ref 98–111)
Creatinine, Ser: 3.54 mg/dL — ABNORMAL HIGH (ref 0.61–1.24)
GFR, Estimated: 18 mL/min — ABNORMAL LOW (ref >60)
Glucose, Bld: 158 mg/dL — ABNORMAL HIGH (ref 70–99)
Phosphorus: 3.1 mg/dL (ref 2.5–4.6)
Potassium: 4.7 mmol/L (ref 3.5–5.1)
Sodium: 137 mmol/L (ref 135–145)

## 2020-08-04 LAB — COMPREHENSIVE METABOLIC PANEL
ALT: 10 U/L (ref 0–44)
AST: 22 U/L (ref 15–41)
Albumin: 2.5 g/dL — ABNORMAL LOW (ref 3.5–5.0)
Alkaline Phosphatase: 69 U/L (ref 38–126)
Anion gap: 13 (ref 5–15)
BUN: 36 mg/dL — ABNORMAL HIGH (ref 8–23)
CO2: 27 mmol/L (ref 22–32)
Calcium: 8.7 mg/dL — ABNORMAL LOW (ref 8.9–10.3)
Chloride: 99 mmol/L (ref 98–111)
Creatinine, Ser: 4.77 mg/dL — ABNORMAL HIGH (ref 0.61–1.24)
GFR, Estimated: 12 mL/min — ABNORMAL LOW (ref >60)
Glucose, Bld: 180 mg/dL — ABNORMAL HIGH (ref 70–99)
Potassium: 4.5 mmol/L (ref 3.5–5.1)
Sodium: 139 mmol/L (ref 135–145)
Total Bilirubin: 0.7 mg/dL (ref 0.3–1.2)
Total Protein: 6.2 g/dL — ABNORMAL LOW (ref 6.5–8.1)

## 2020-08-04 LAB — SEDIMENTATION RATE: Sed Rate: 131 mm/hr — ABNORMAL HIGH (ref 0–16)

## 2020-08-04 LAB — GLUCOSE, CAPILLARY
Glucose-Capillary: 122 mg/dL — ABNORMAL HIGH (ref 70–99)
Glucose-Capillary: 125 mg/dL — ABNORMAL HIGH (ref 70–99)
Glucose-Capillary: 151 mg/dL — ABNORMAL HIGH (ref 70–99)
Glucose-Capillary: 170 mg/dL — ABNORMAL HIGH (ref 70–99)
Glucose-Capillary: 176 mg/dL — ABNORMAL HIGH (ref 70–99)

## 2020-08-04 LAB — LACTIC ACID, PLASMA
Lactic Acid, Venous: 1 mmol/L (ref 0.5–1.9)
Lactic Acid, Venous: 1 mmol/L (ref 0.5–1.9)

## 2020-08-04 LAB — C-REACTIVE PROTEIN: CRP: 4.8 mg/dL — ABNORMAL HIGH (ref <1.0)

## 2020-08-04 MED ORDER — ACETAMINOPHEN 325 MG PO TABS
ORAL_TABLET | ORAL | Status: AC
  Administered 2020-08-04: 650 mg via ORAL
  Filled 2020-08-04: qty 2

## 2020-08-04 MED ORDER — CEFAZOLIN SODIUM-DEXTROSE 2-4 GM/100ML-% IV SOLN
2.0000 g | INTRAVENOUS | Status: DC
  Administered 2020-08-04 – 2020-08-06 (×2): 2 g via INTRAVENOUS
  Filled 2020-08-04 (×4): qty 100

## 2020-08-04 MED ORDER — ACETAMINOPHEN 325 MG PO TABS
650.0000 mg | ORAL_TABLET | ORAL | Status: DC | PRN
  Administered 2020-08-04 – 2020-08-09 (×6): 650 mg via ORAL
  Filled 2020-08-04 (×9): qty 2

## 2020-08-04 MED ORDER — PIPERACILLIN-TAZOBACTAM 3.375 G IVPB
3.3750 g | Freq: Two times a day (BID) | INTRAVENOUS | Status: DC
  Administered 2020-08-04 (×2): 3.375 g via INTRAVENOUS
  Filled 2020-08-04 (×2): qty 50

## 2020-08-04 MED ORDER — LACTATED RINGERS IV SOLN: INTRAVENOUS | Status: AC

## 2020-08-04 MED ORDER — VANCOMYCIN HCL IN DEXTROSE 750-5 MG/150ML-% IV SOLN
750.0000 mg | INTRAVENOUS | Status: DC
  Filled 2020-08-04 (×2): qty 150

## 2020-08-04 MED ORDER — ACETAMINOPHEN 650 MG RE SUPP: 650.0000 mg | Freq: Four times a day (QID) | RECTAL | Status: DC | PRN

## 2020-08-04 MED ORDER — VANCOMYCIN HCL 1500 MG/300ML IV SOLN
1500.0000 mg | Freq: Once | INTRAVENOUS | Status: AC
  Administered 2020-08-04: 1500 mg via INTRAVENOUS
  Filled 2020-08-04: qty 300

## 2020-08-04 NOTE — Progress Notes (Signed)
HD#6 Subjective:   Overnight, febrile with Tmax 103. Evaluated by cross cover team. See their note for details.  During evaluation this morning, patient endorses feeling warm overnight. States he feels okay this morning and denies any nausea this morning. He is not sure where his fever could be coming from. We discussed further evaluation by vascular surgery and following up blood cultures.  Objective:   Vital signs in last 24 hours: Vitals:   08/04/20 0000 08/04/20 0200 08/04/20 0337 08/04/20 0500  BP: (!) 151/75 125/61 (!) 142/73   Pulse: (!) 104 93 92   Resp: 20 19 18    Temp: (!) 102.7 F (39.3 C) 100 F (37.8 C) 100.1 F (37.8 C)   TempSrc: Oral Oral Oral   SpO2: 100% 94% 99%   Weight:    70 kg  Height:       Physical Exam Constitutional:chronically ill-appearingmanlying bed, in no acute distress Cardiovascular: regular rate and rhythm, no m/r/g Pulmonary/Chest: normal work of breathing on room air, lungs clear to auscultation bilaterally WNU:UVOZDG build, L BKA  Skin:RLE cool and dry. Possible progression of the dry gangrene of R great toe and second digit. See Media tab for 1/27 AM picture.  Pertinent Labs: CBC Latest Ref Rng & Units 08/04/2020 08/03/2020 08/03/2020  WBC 4.0 - 10.5 K/uL 13.9(H) 15.9(H) 8.8  Hemoglobin 13.0 - 17.0 g/dL 8.4(L) 9.5(L) 8.2(L)  Hematocrit 39.0 - 52.0 % 25.0(L) 28.7(L) 24.8(L)  Platelets 150 - 400 K/uL 319 323 287    CMP Latest Ref Rng & Units 08/04/2020 08/03/2020 08/02/2020  Glucose 70 - 99 mg/dL 180(H) 236(H) 129(H)  BUN 8 - 23 mg/dL 36(H) 23 58(H)  Creatinine 0.61 - 1.24 mg/dL 4.77(H) 2.78(H) 4.71(H)  Sodium 135 - 145 mmol/L 139 136 135  Potassium 3.5 - 5.1 mmol/L 4.5 4.2 3.6  Chloride 98 - 111 mmol/L 99 99 94(L)  CO2 22 - 32 mmol/L 27 26 25   Calcium 8.9 - 10.3 mg/dL 8.7(L) 8.0(L) 8.8(L)  Total Protein 6.5 - 8.1 g/dL 6.2(L) - -  Total Bilirubin 0.3 - 1.2 mg/dL 0.7 - -  Alkaline Phos 38 - 126 U/L 69 - -  AST 15 - 41 U/L 22 -  -  ALT 0 - 44 U/L 10 - -    Imaging: DG CHEST PORT 1 VIEW  Result Date: 08/04/2020 CLINICAL DATA:  70 year old male with fever. Left thalamic infarct. Recent syncope, fall. EXAM: PORTABLE CHEST 1 VIEW COMPARISON:  Portable chest 07/28/2020 and earlier. FINDINGS: Portable AP semi upright view at 0437 hours. Larger lung volumes. Stable mediastinal contours with mild tortuosity of the thoracic aorta, heart size within normal limits. Visualized tracheal air column is within normal limits. Allowing for portable technique the lungs are clear. No pneumothorax. Negative visible bowel gas pattern. No acute osseous abnormality identified. IMPRESSION: No acute cardiopulmonary abnormality. Electronically Signed   By: Genevie Ann M.D.   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 man with history of ESRD on HD TTS, type 2 diabetes mellitus, history of CVA, hypertension, heart failure, and peripheral vascular disease on dual antiplatelet therapy who presented after syncopal episode at dialysis and admitted for further work-up and evaluation.   This is hospital day 6.   Sepsis secondary to MSSA bacteremia Patient with new fever yesterday and overnight (Tmax 103) associated with chills. Vitals otherwise stable without tachycardia or hypotension. Leukocytosis 15.9->13.9. Lactic acid 1->1.  Empiric vancomycin and cefepime initiated overnight. 3 out of 4 blood cultures collected 1/26 at 6 pm with staph aureus, susceptibilities pending. ID consulted. Abx switched to cefazolin per pharmacy. CXR negative. Skin otherwise intact. No suprapubic tenderness. Suspected source of bacteremia is patient's severe PAD. Evaluated by vascular surgery today who plan for angiography tomorrow and ultimately some form of digital or transmetatarsal amputation.  - ID consulted, appreciate recs - f/u blood culture susceptibilities - cefazolin per pharmacy (Day 1: 1/27) - Angiography per vascular surgery tomorrow (1/28)  R 1st and 2nd digit ulcerations History of Peripheral vascular disease s/p L BKA On dual antiplatelet platelet therapy due to PVD. Vascular consulted and plan for angiography by Dr. Trula Slade tomorrow as above. - Vascular surgery consulted, appreciate their expertise - Angiography tomorrow  - NPO at midnight  Syncopal episode Post-HD hypotension Acute ischemic left thalamic infarct History of multiple prior CVAs (2017, 2021) HTN, HLD Patient has not had another episode of bradycardia and syncope since HD on Mon (1/24). Last HD Tues (1/25). Nephrology plan HD today. - Continue home ASA and Plavix - Continue atorvastatin 80 mg   ESRD on HD (TTS) Nephrology consulted, appreciate recs and coordination of HD. RUE Fistula viable.  - HD today (1/27)  Acute on chronic anemia FOBT positive Nonerosive esophagitis Hgb stable at 8.4.  - GI consulted and has now signed off following EGD, appreciate their recs             - Continue Protonix 40 mg daily indefinitely             - No outpatient or GI follow-up required - AM CBC  Type 2 diabetes mellitus Patient has been previously well-controlled off medications. Last A1c 6.4% in 02/2020, likely falsely low in s/o ESRD.  - Lantus 7 units - SSI sensitive - Carb/renal diet if not NPO   Diet: Carb/Renal -> NPO at midnight IVF: None VTE:  SCDs Code: Full PT/OT recs: SNF for Subacute PT    Please contact the on call pager after 5 pm and on weekends at [phone].  [name], MD PGY-1 Internal Medicine Teaching Service Pager: [phone] 08/04/2020

## 2020-08-04 NOTE — Progress Notes (Signed)
[facility] Progress Note   Subjective:   Now with fevers overnight. Patient holding emesis bag but has not vomited. Alert but does not want to talk, turns away.   Objective Vitals:   08/04/20 0337 08/04/20 0500 08/04/20 0724 08/04/20 1136  BP: (!) 142/73  (!) 168/84 108/62  Pulse: 92  87 74  Resp: 18  18 18   Temp: 100.1 F (37.8 C)  (!) 101.8 F (38.8 C) (!) 100.9 F (38.3 C)  TempSrc: Oral  Oral Oral  SpO2: 99%  100% 98%  Weight:  70 kg    Height:       Physical Exam General:Well developed male, alert and in NAD Heart:RRR, no murmur, rubs or gallops Lungs:CTA anteriorly without wheezing, rhonchi or rales Abdomen:Soft, non-tender, non-distended, +BS Extremities:No edema b/l lower extremities, R foot with ischemic second toe Dialysis Access:RUE AVF + bruit  Additional Objective Labs: Basic Metabolic Panel: Recent Labs  Lab 07/31/20 0058 08/01/20 0809 08/02/20 0535 08/03/20 0043 08/04/20 0334  NA 136 135 135 136 139  K 4.0 3.5 3.6 4.2 4.5  CL 97* 98 94* 99 99  CO2 26 25 25 26 27   GLUCOSE 160* 138* 129* 236* 180*  BUN 73* 68* 58* 23 36*  CREATININE 5.22* 4.19* 4.71* 2.78* 4.77*  CALCIUM 9.0 8.7* 8.8* 8.0* 8.7*  PHOS 3.1 2.3*  --  2.2*  --    Liver Function Tests: Recent Labs  Lab 08/01/20 0809 08/03/20 0043 08/04/20 0334  AST  --   --  22  ALT  --   --  10  ALKPHOS  --   --  69  BILITOT  --   --  0.7  PROT  --   --  6.2*  ALBUMIN 2.5* 2.5* 2.5*   No results for input(s): LIPASE, AMYLASE in the last 168 hours. CBC: Recent Labs  Lab 07/30/20 0230 07/30/20 2023 08/02/20 0535 08/03/20 0043 08/03/20 1129 08/03/20 1958 08/04/20 0334  WBC 7.7   < > 10.0 8.2 8.8 15.9* 13.9*  NEUTROABS 4.8  --   --  6.0  --  13.7*  --   HGB 7.2*   < > 9.5* 8.2* 8.2* 9.5* 8.4*  HCT 20.7*   < > 28.2* 24.9* 24.8* 28.7* 25.0*  MCV 90.0   < > 88.4 90.9 90.5 91.7 90.6  PLT 185   < > 271 253 287 323 319   < > = values in this interval not displayed.    Blood Culture    Component Value Date/Time   SDES BLOOD SITE NOT SPECIFIED 08/03/2020 1803   SPECREQUEST  08/03/2020 1803    BOTTLES DRAWN AEROBIC AND ANAEROBIC Blood Culture adequate volume   CULT GRAM POSITIVE COCCI 08/03/2020 1803   REPTSTATUS PENDING 08/03/2020 1803   CBG: Recent Labs  Lab 08/03/20 1201 08/03/20 1815 08/03/20 2133 08/04/20 0602 08/04/20 1133  GLUCAP 124* 144* 179* 170* 176*    Studies/Results: DG CHEST PORT 1 VIEW  Result Date: 08/04/2020 CLINICAL DATA:  70 year old male with fever. Left thalamic infarct. Recent syncope, fall. EXAM: PORTABLE CHEST 1 VIEW COMPARISON:  Portable chest 07/28/2020 and earlier. FINDINGS: Portable AP semi upright view at 0437 hours. Larger lung volumes. Stable mediastinal contours with mild tortuosity of the thoracic aorta, heart size within normal limits. Visualized tracheal air column is within normal limits. Allowing for portable technique the lungs are clear. No pneumothorax. Negative visible bowel gas pattern. No acute osseous abnormality identified. IMPRESSION: No acute cardiopulmonary abnormality. Electronically Signed  By: Genevie Ann M.D.   On: 08/04/2020 06:01   Medications: . piperacillin-tazobactam (ZOSYN)  IV 3.375 g (08/04/20 0826)  . vancomycin     . sodium chloride   Intravenous Once  . aspirin EC  81 mg Oral Daily  . atorvastatin  80 mg Oral QHS  . Chlorhexidine Gluconate Cloth  6 each Topical Q0600  . Chlorhexidine Gluconate Cloth  6 each Topical Q0600  . clopidogrel  75 mg Oral Daily  . docusate sodium  100 mg Oral Daily  . dorzolamide-timolol  1 drop Right Eye BID  . finasteride  5 mg Oral Daily  . insulin aspart  0-6 Units Subcutaneous TID WC  . insulin glargine  7 Units Subcutaneous QHS  . pantoprazole  40 mg Oral Daily  . sodium chloride flush  3 mL Intravenous Q12H    Dialysis Orders: TTS -[facility] 4hrs, BFR400, [id number], EDW 69kg,2K/2.25Ca Access:RU AVF Heparin4000 unit  bolus Hectorol37mcg IV qHD  Assessment/Plan: 1. Syncopal episode -likely multifactorial - acute CVA, hypotension, and acute on chronic anemia.Had another episode of bradycardia/ syncope on HD Monday. Not volume overloaded. Minimal UF with HD. 2. Acute CVA- seen on MRI. Thought to be related to cerebral hypoperfusion with hypotension post HD, as this was the cause of a past stroke. Per neuro/Admit 3. R 1st & 2nd digit ulcerations/Hx PVD: S/p RLE balloon angioplasty to PTA 05/29/20.VVS consulted, plan for arteriogram tomorrow  4. ESRD:Continue TTS schedule. HD treatments shortened due to high patient census/staffing shortage. Not uremic or volume overloaded on exam. Planned for HD today.  5. Hypertension/volume: BP controlled, no volumeoverloadon exam.Slightly below EDW.  6. Acute on chronic Anemiaof CKD:+ FOBT, Hgb drop 11.3 (on 1/13 as OP) to 7.4, s/p 1 unit pRBC1/20and another unit on 1/24.GI consulted,underwent EGD 1/25 which showed esophagitis.Hgb now 8.4.  7. Secondary Hyperparathyroidism:Corrected calcium 9.9, has been running high,Phos2.2. Binders discontinued .Hectorol on hold, follow Ca.  8. Nutrition- Renal diet w/fluid restrictions.  9. DMT2 10. Fever: unclear cause, on empiric antibiotics. Per primary.    Anice Paganini, PA-C 08/04/2020, 11:51 AM  [facility] Pager: [phone]

## 2020-08-04 NOTE — Progress Notes (Signed)
PT Cancellation Note  Patient Details Name: Dennis Macias MRN: [id number] DOB: April 28, 1951   Cancelled Treatment:    Reason Eval/Treat Not Completed: Patient at procedure or test/unavailable. Pt off unit at HDU. PT will follow up as able.   Zenaida Niece 08/04/2020, 4:35 PM

## 2020-08-04 NOTE — Consult Note (Signed)
Date of Admission:  07/28/2020          Reason for Consult: MSSA bacteremia    Referring Provider: Dr. Joni Reining, DO   Assessment:  1. MSSA bacteremia: Patient found to be febrile overnight. Leukocytosis trending down with normal lactic acid.  Blood cultures grew staph aureus.  Pending susceptibility results.  Patient has a graft and not a tunneled catheter for HD. Likely source of bacteremia is patient's right 1st and 2nd digit ulcerations. Empirical antibiotics with vancomycin and Zosyn started overnight. Will de-escalate antibiotics and assess for endocarditis. Patient will need management of his RLE circulation by Vascular surgery.  Plan:  1. Discontinue vancomycin and Zosyn 2. Start IV cefazolin 3. Will get an echo 4. Will follow-up inflammatory markers 5. Will follow-up susceptibility results   Active Problems:   Diabetes mellitus with retinopathy of both eyes ([place])   Hyperlipidemia   Acute on chronic anemia   Chronic combined systolic and diastolic heart failure (HCC)   MSSA bacteremia   ESRD (end stage renal disease) ([place])   Acute cerebrovascular accident (CVA) due to ischemia (HCC)   PAD (peripheral artery disease) (HCC)   Orthostatic hypotension   S/P BKA (below knee amputation) ([place])   Syncope   Stroke ([place])   Dark stools   Gastroesophageal reflux disease with esophagitis   Nonerosive esophageal reflux disease   Hiatal hernia with GERD and esophagitis   Scheduled Meds: . sodium chloride   Intravenous Once  . aspirin EC  81 mg Oral Daily  . atorvastatin  80 mg Oral QHS  . Chlorhexidine Gluconate Cloth  6 each Topical Q0600  . Chlorhexidine Gluconate Cloth  6 each Topical Q0600  . clopidogrel  75 mg Oral Daily  . docusate sodium  100 mg Oral Daily  . dorzolamide-timolol  1 drop Right Eye BID  . finasteride  5 mg Oral Daily  . insulin aspart  0-6 Units Subcutaneous TID WC  . insulin glargine  7 Units Subcutaneous QHS  . pantoprazole  40 mg Oral  Daily  . sodium chloride flush  3 mL Intravenous Q12H   Continuous Infusions: .  ceFAZolin (ANCEF) IV     PRN Meds:.acetaminophen **OR** acetaminophen, ondansetron (ZOFRAN) IV  HPI: Dennis Macias is a 70 y.o. male  W/ PMH of ESRD (HD TTS) T2DM, CVA, HTN, HF, and PVD s/p L BKA  on dual antiplatelet therapy who presented after syncopal episode at dialysis and admitted for further work-up. Hospital course complicated by sepsis secondary to MSSA bacteremia. ID consulted for further evaluation and management.   Patient initially presented after syncope episode during dialysis in the outpatient. He was admitted on 1/20 for further evaluation. Patient continued HD inpatient with another episode of syncope during HD on 1/24. On admission, patient was also found to have right first and second digit ulcerations so vascular surgery was consulted for further management. RLE duplex and ABI obtained but they were unreliable due to noncompressible RLE arteries. Vascular surgery unable to do abdominal aortogram the patient was scheduled to follow-up with Dr. Trula Slade as an outpatient.   The night before discharge, patient became febrile with a T-max of 103.  That has since remained stable. Blood cultures were collected and he was initially started on vancomycin and cefepime. Cefepime was switched to Zosyn.  Blood cultures grew MSSA and ID was consulted for further evaluation and management. Patient doing okay during assessment today. He endorsed feeling feverish overnight and vomiting but denies any chills,  shortness of breath, leg pain or abdominal pain. He is scheduled to have angiography by vascular surgery tomorrow with plans for digital amputation in the future.   Review of Systems: Review of Systems  Constitutional: Positive for fever. Negative for chills.  Respiratory: Negative for cough, shortness of breath and wheezing.   Cardiovascular: Negative for chest pain and leg swelling.  Gastrointestinal: Positive  for vomiting. Negative for abdominal pain, diarrhea and nausea.  Genitourinary: Negative for dysuria and hematuria.  Neurological: Negative for dizziness, focal weakness and headaches.    Past Medical History:  Diagnosis Date  . Anemia   . Arthritis    past hx   . Blindness    right eye r/t diabetes per wife [name]  . Cardiorenal syndrome   . Cataract    removed both eyes  . CHF (congestive heart failure) (HCC)    hx  . CKD (chronic kidney disease)    Dialysis T Th Sat  . Dehydration   . Diabetes ([place])    type 2 - diet controlled, no meds  . Glaucoma   . History of CVA (cerebrovascular accident) 09/13/2015  . History of stroke 09/13/2015  . History of urinary retention   . HOH (hard of hearing)    no hearing aids  . Hyperlipidemia   . Hypertension   . NSTEMI (non-ST elevated myocardial infarction) ([place])   . Peripheral vascular disease ([place])   . Pernicious anemia 02/24/2018  . S/P TURP   . Stroke [facility])    2017- March, no deficit   . Syncope 11/2019  . Tachycardia 08/26/2017  . Tubular adenoma of colon 02/2017  . Walker as ambulation aid    and occasional uses cane  . Weight loss, non-intentional 08/26/2017   10 lbs between 6/18 & 2/19    Social History   Tobacco Use  . Smoking status: Former Smoker    Types: Cigarettes  . Smokeless tobacco: Former Systems developer    Types: Chew    Quit date: 07/09/1978  . Tobacco comment: quit yrs ago  Vaping Use  . Vaping Use: Never used  Substance Use Topics  . Alcohol use: Not Currently    Alcohol/week: 1.0 standard drink    Types: 1 Cans of beer per week    Comment: quit last march/2017  . Drug use: No    Family History  Problem Relation Age of Onset  . Hypertension Mother   . Hyperlipidemia Mother   . Hyperlipidemia Father   . Colon cancer Neg Hx   . Colon polyps Neg Hx   . Esophageal cancer Neg Hx   . Rectal cancer Neg Hx   . Stomach cancer Neg Hx    Allergies  Allergen Reactions  . Cefepime Other (See Comments)    Pt had  BAD encephalopathy from Cefepime    OBJECTIVE: Blood pressure 108/62, pulse 74, temperature (!) 100.9 F (38.3 C), temperature source Oral, resp. rate 18, height 6' (1.829 m), weight 70 kg, SpO2 98 %.  Physical Exam Constitutional:      Appearance: Normal appearance.  HENT:     Head: Normocephalic and atraumatic.  Eyes:     Conjunctiva/sclera: Conjunctivae normal.  Cardiovascular:     Rate and Rhythm: Normal rate and regular rhythm.     Comments: Weak R dorsalis pedis pulse Pulmonary:     Effort: Pulmonary effort is normal.     Breath sounds: Normal breath sounds. No wheezing.  Abdominal:     General: Abdomen is flat. Bowel  sounds are normal.     Palpations: Abdomen is soft.     Tenderness: There is no abdominal tenderness.  Musculoskeletal:        General: No tenderness. Normal range of motion.     Cervical back: Normal range of motion.     Comments: Left BKA w/ no signs of stump infection. Dry gangrene of R great toe and 2nd digit.  Skin:    General: Skin is dry.  Neurological:     General: No focal deficit present.     Mental Status: He is alert and oriented to person, place, and time.     Sensory: No sensory deficit.  Psychiatric:        Mood and Affect: Mood normal.     Lab Results Lab Results  Component Value Date   WBC 13.9 (H) 08/04/2020   HGB 8.4 (L) 08/04/2020   HCT 25.0 (L) 08/04/2020   MCV 90.6 08/04/2020   PLT 319 08/04/2020    Lab Results  Component Value Date   CREATININE 4.77 (H) 08/04/2020   BUN 36 (H) 08/04/2020   NA 139 08/04/2020   K 4.5 08/04/2020   CL 99 08/04/2020   CO2 27 08/04/2020    Lab Results  Component Value Date   ALT 10 08/04/2020   AST 22 08/04/2020   ALKPHOS 69 08/04/2020   BILITOT 0.7 08/04/2020     Microbiology: Recent Results (from the past 240 hour(s))  SARS CORONAVIRUS 2 (TAT 6-24 HRS) Nasopharyngeal Nasopharyngeal Swab     Status: None   Collection Time: 07/28/20  7:01 PM   Specimen: Nasopharyngeal Swab   Result Value Ref Range Status   SARS Coronavirus 2 NEGATIVE NEGATIVE Final    Comment: (NOTE) SARS-CoV-2 target nucleic acids are NOT DETECTED.  The SARS-CoV-2 RNA is generally detectable in upper and lower respiratory specimens during the acute phase of infection. Negative results do not preclude SARS-CoV-2 infection, do not rule out co-infections with other pathogens, and should not be used as the sole basis for treatment or other patient management decisions. Negative results must be combined with clinical observations, patient history, and epidemiological information. The expected result is Negative.  Fact Sheet for Patients: SugarRoll.be  Fact Sheet for Healthcare Providers: https://www.woods-mathews.com/  This test is not yet approved or cleared by the [place] FDA and  has been authorized for detection and/or diagnosis of SARS-CoV-2 by FDA under an Emergency Use Authorization (EUA). This EUA will remain  in effect (meaning this test can be used) for the duration of the COVID-19 declaration under Se ction 564(b)(1) of the Act, 21 U.S.C. section 360bbb-3(b)(1), unless the authorization is terminated or revoked sooner.  Performed at [facility], [place] [address] [place], [place] [address]   SARS CORONAVIRUS 2 (TAT 6-24 HRS) Nasopharyngeal Nasopharyngeal Swab     Status: None   Collection Time: 08/03/20  5:12 PM   Specimen: Nasopharyngeal Swab  Result Value Ref Range Status   SARS Coronavirus 2 NEGATIVE NEGATIVE Final    Comment: (NOTE) SARS-CoV-2 target nucleic acids are NOT DETECTED.  The SARS-CoV-2 RNA is generally detectable in upper and lower respiratory specimens during the acute phase of infection. Negative results do not preclude SARS-CoV-2 infection, do not rule out co-infections with other pathogens, and should not be used as the sole basis for treatment or other patient management decisions. Negative  results must be combined with clinical observations, patient history, and epidemiological information. The expected result is Negative.  Fact Sheet  for Patients: SugarRoll.be  Fact Sheet for Healthcare Providers: https://www.woods-mathews.com/  This test is not yet approved or cleared by the [place] FDA and  has been authorized for detection and/or diagnosis of SARS-CoV-2 by FDA under an Emergency Use Authorization (EUA). This EUA will remain  in effect (meaning this test can be used) for the duration of the COVID-19 declaration under Se ction 564(b)(1) of the Act, 21 U.S.C. section 360bbb-3(b)(1), unless the authorization is terminated or revoked sooner.  Performed at [facility], [place] [address], [place], [place] [address]   Culture, blood (routine x 2)     Status: None (Preliminary result)   Collection Time: 08/03/20  5:58 PM   Specimen: BLOOD  Result Value Ref Range Status   Specimen Description BLOOD SITE NOT SPECIFIED  Final   Special Requests   Final    BOTTLES DRAWN AEROBIC AND ANAEROBIC Blood Culture adequate volume   Culture  Setup Time   Final    GRAM POSITIVE COCCI IN CLUSTERS IN BOTH AEROBIC AND ANAEROBIC BOTTLES Organism ID to follow CRITICAL RESULT CALLED TO, READ BACK BY AND VERIFIED WITH: Sharen Heck PharmD 12:30 08/04/20 ([email or web address]) Performed at [facility], [place] [address], [place], [place] [address]    Culture GRAM POSITIVE COCCI  Final   Report Status PENDING  Incomplete  Blood Culture ID Panel (Reflexed)     Status: Abnormal   Collection Time: 08/03/20  5:58 PM  Result Value Ref Range Status   Enterococcus faecalis NOT DETECTED NOT DETECTED Final   Enterococcus Faecium NOT DETECTED NOT DETECTED Final   Listeria monocytogenes NOT DETECTED NOT DETECTED Final   Staphylococcus species DETECTED (A) NOT DETECTED Final    Comment: CRITICAL RESULT CALLED TO, READ BACK BY AND VERIFIED WITH: Sharen Heck PharmD 12:30 08/04/20 ([email or web address])    Staphylococcus aureus (BCID) DETECTED (A) NOT DETECTED Final    Comment: CRITICAL RESULT CALLED TO, READ BACK BY AND VERIFIED WITH: Sharen Heck PharmD 12:30 08/04/20 ([email or web address])    Staphylococcus epidermidis NOT DETECTED NOT DETECTED Final   Staphylococcus lugdunensis NOT DETECTED NOT DETECTED Final   Streptococcus species NOT DETECTED NOT DETECTED Final   Streptococcus agalactiae NOT DETECTED NOT DETECTED Final   Streptococcus pneumoniae NOT DETECTED NOT DETECTED Final   Streptococcus pyogenes NOT DETECTED NOT DETECTED Final   A.calcoaceticus-baumannii NOT DETECTED NOT DETECTED Final   Bacteroides fragilis NOT DETECTED NOT DETECTED Final   Enterobacterales NOT DETECTED NOT DETECTED Final   Enterobacter cloacae complex NOT DETECTED NOT DETECTED Final   Escherichia coli NOT DETECTED NOT DETECTED Final   Klebsiella aerogenes NOT DETECTED NOT DETECTED Final   Klebsiella oxytoca NOT DETECTED NOT DETECTED Final   Klebsiella pneumoniae NOT DETECTED NOT DETECTED Final   Proteus species NOT DETECTED NOT DETECTED Final   Salmonella species NOT DETECTED NOT DETECTED Final   Serratia marcescens NOT DETECTED NOT DETECTED Final   Haemophilus influenzae NOT DETECTED NOT DETECTED Final   Neisseria meningitidis NOT DETECTED NOT DETECTED Final   Pseudomonas aeruginosa NOT DETECTED NOT DETECTED Final   Stenotrophomonas maltophilia NOT DETECTED NOT DETECTED Final   Candida albicans NOT DETECTED NOT DETECTED Final   Candida auris NOT DETECTED NOT DETECTED Final   Candida glabrata NOT DETECTED NOT DETECTED Final   Candida krusei NOT DETECTED NOT DETECTED Final   Candida parapsilosis NOT DETECTED NOT DETECTED Final   Candida tropicalis NOT DETECTED NOT DETECTED Final   Cryptococcus neoformans/gattii NOT DETECTED NOT DETECTED Final   Meth resistant  mecA/C and MREJ NOT DETECTED NOT DETECTED Final    Comment: Performed at [facility], [place] [address]  [place], [place] [address]  Culture, blood (routine x 2)     Status: None (Preliminary result)   Collection Time: 08/03/20  6:03 PM   Specimen: BLOOD  Result Value Ref Range Status   Specimen Description BLOOD SITE NOT SPECIFIED  Final   Special Requests   Final    BOTTLES DRAWN AEROBIC AND ANAEROBIC Blood Culture adequate volume   Culture  Setup Time   Final    GRAM POSITIVE COCCI IN CLUSTERS IN BOTH AEROBIC AND ANAEROBIC BOTTLES CRITICAL VALUE NOTED.  VALUE IS CONSISTENT WITH PREVIOUSLY REPORTED AND CALLED VALUE. Performed at [facility], [place] [address] [place],  [address]    Culture [facility] POSITIVE COCCI  Final   Report Status PENDING  Incomplete    Lacinda Axon, MD [facility] [place] [phone] pager  08/04/2020, 1:20 PM

## 2020-08-04 NOTE — Progress Notes (Signed)
PHARMACY - PHYSICIAN COMMUNICATION CRITICAL VALUE ALERT - BLOOD CULTURE IDENTIFICATION (BCID)  Dennis Macias is an 70 y.o. male who presented to [facility] on 07/28/2020 with a chief complaint of syncopal episode following HD. He was preparing to be discharged but started to have some fevers. Blood cultures were drawn out of caution and he is now positive for MSSA bacteremia.   Assessment: 3/4 MSSA  Name of physician (or Provider) Contacted: Dr. Madalyn Rob, ID was also alerted and will be autoconsulted   Current antibiotics: vancomycin and zosyn   Changes to prescribed antibiotics recommended:  Stop vancomycin/zosyn  Start cefazolin   Results for orders placed or performed during the hospital encounter of 07/28/20  Blood Culture ID Panel (Reflexed) (Collected: 08/03/2020  5:58 PM)  Result Value Ref Range   Enterococcus faecalis NOT DETECTED NOT DETECTED   Enterococcus Faecium NOT DETECTED NOT DETECTED   Listeria monocytogenes NOT DETECTED NOT DETECTED   Staphylococcus species DETECTED (A) NOT DETECTED   Staphylococcus aureus (BCID) DETECTED (A) NOT DETECTED   Staphylococcus epidermidis NOT DETECTED NOT DETECTED   Staphylococcus lugdunensis NOT DETECTED NOT DETECTED   Streptococcus species NOT DETECTED NOT DETECTED   Streptococcus agalactiae NOT DETECTED NOT DETECTED   Streptococcus pneumoniae NOT DETECTED NOT DETECTED   Streptococcus pyogenes NOT DETECTED NOT DETECTED   A.calcoaceticus-baumannii NOT DETECTED NOT DETECTED   Bacteroides fragilis NOT DETECTED NOT DETECTED   Enterobacterales NOT DETECTED NOT DETECTED   Enterobacter cloacae complex NOT DETECTED NOT DETECTED   Escherichia coli NOT DETECTED NOT DETECTED   Klebsiella aerogenes NOT DETECTED NOT DETECTED   Klebsiella oxytoca NOT DETECTED NOT DETECTED   Klebsiella pneumoniae NOT DETECTED NOT DETECTED   Proteus species NOT DETECTED NOT DETECTED   Salmonella species NOT DETECTED NOT DETECTED   Serratia marcescens NOT DETECTED  NOT DETECTED   Haemophilus influenzae NOT DETECTED NOT DETECTED   Neisseria meningitidis NOT DETECTED NOT DETECTED   Pseudomonas aeruginosa NOT DETECTED NOT DETECTED   Stenotrophomonas maltophilia NOT DETECTED NOT DETECTED   Candida albicans NOT DETECTED NOT DETECTED   Candida auris NOT DETECTED NOT DETECTED   Candida glabrata NOT DETECTED NOT DETECTED   Candida krusei NOT DETECTED NOT DETECTED   Candida parapsilosis NOT DETECTED NOT DETECTED   Candida tropicalis NOT DETECTED NOT DETECTED   Cryptococcus neoformans/gattii NOT DETECTED NOT DETECTED   Meth resistant mecA/C and MREJ NOT DETECTED NOT DETECTED    Phillis Haggis 08/04/2020  12:37 PM

## 2020-08-04 NOTE — Progress Notes (Signed)
    Subjective  -  Patient with fevers overnight   Physical Exam:  Progressive ischemic changes to toes on the right foot       Assessment/Plan:    I discussed with the patient that this is a limb threatening situation.  I would like to proceed with angiography given his change in clinical condition while he is in the hospital.  I will try to get this arranged for tomorrow.  He is also most likely going to need some form of digital amputation or possibly a transmetatarsal amputation.  He is certainly at risk for a more proximal transtibial amputation.  Patient understands this.  He will be n.p.o. after midnight.  Dennis Macias 08/04/2020 11:35 AM --  Vitals:   08/04/20 0337 08/04/20 0724  BP: (!) 142/73 (!) 168/84  Pulse: 92 87  Resp: 18 18  Temp: 100.1 F (37.8 C) (!) 101.8 F (38.8 C)  SpO2: 99% 100%    Intake/Output Summary (Last 24 hours) at 08/04/2020 1135 Last data filed at 08/04/2020 0900 Gross per 24 hour  Intake 120 ml  Output --  Net 120 ml     Laboratory CBC    Component Value Date/Time   WBC 13.9 (H) 08/04/2020 0334   HGB 8.4 (L) 08/04/2020 0334   HGB 11.1 (L) 05/02/2020 1454   HCT 25.0 (L) 08/04/2020 0334   HCT 35.5 (L) 05/02/2020 1454   PLT 319 08/04/2020 0334   PLT 221 05/02/2020 1454    BMET    Component Value Date/Time   NA 139 08/04/2020 0334   NA 137 12/10/2019 1439   K 4.5 08/04/2020 0334   CL 99 08/04/2020 0334   CO2 27 08/04/2020 0334   GLUCOSE 180 (H) 08/04/2020 0334   BUN 36 (H) 08/04/2020 0334   BUN 80 (HH) 12/10/2019 1439   CREATININE 4.77 (H) 08/04/2020 0334   CALCIUM 8.7 (L) 08/04/2020 0334   GFRNONAA 12 (L) 08/04/2020 0334   GFRAA 19 (L) 04/05/2020 0705    COAG Lab Results  Component Value Date   INR 1.6 (H) 03/31/2020   INR 1.5 (H) 02/17/2020   INR 1.9 (H) 01/14/2020   No results found for: PTT  Antibiotics Anti-infectives (From admission, onward)   Start     Dose/Rate Route Frequency Ordered Stop    08/04/20 1200  vancomycin (VANCOCIN) IVPB 750 mg/150 ml premix        750 mg 150 mL/hr over 60 Minutes Intravenous Every T-Th-Sa (Hemodialysis) 08/04/20 0139     08/04/20 0230  vancomycin (VANCOREADY) IVPB 1500 mg/300 mL        1,500 mg 150 mL/hr over 120 Minutes Intravenous  Once 08/04/20 0139 08/04/20 0823   08/04/20 0230  piperacillin-tazobactam (ZOSYN) IVPB 3.375 g        3.375 g 12.5 mL/hr over 240 Minutes Intravenous Every 12 hours 08/04/20 0141         V. Leia Alf, M.D., [facility] [place] Office: [phone] Pager:  [phone]

## 2020-08-04 NOTE — Progress Notes (Signed)
Pharmacy Antibiotic Note  Dennis Macias is a 70 y.o. male admitted on 07/28/2020 with wound infection.  Pharmacy has been consulted for Vancomycin dosing. WBC up to 15.9. Febrile up to 103. ESRD on HD TTS.   Plan: Vancomycin 1500 mg IV x 1, then 750 mg IV qHD TTS Zosyn per MD-adjusted for HD Trend WBC, temp, HD schedule  F/U infectious work-up Drug levels as indicated   Height: 6' (182.9 cm) Weight: 68.6 kg (151 lb 3.8 oz) IBW/kg (Calculated) : 77.6  Temp (24hrs), Avg:100.9 F (38.3 C), Min:98.9 F (37.2 C), Max:103 F (39.4 C)  Recent Labs  Lab 07/30/20 0230 07/30/20 2023 07/31/20 0058 08/01/20 0235 08/01/20 0809 08/02/20 0535 08/03/20 0043 08/03/20 1129 08/03/20 1958  WBC 7.7   < > 9.2 8.5  --  10.0 8.2 8.8 15.9*  CREATININE 4.30*  --  5.22*  --  4.19* 4.71* 2.78*  --   --    < > = values in this interval not displayed.    Estimated Creatinine Clearance: 24.3 mL/min (A) (by C-G formula based on SCr of 2.78 mg/dL (H)).    Allergies  Allergen Reactions  . Cefepime Other (See Comments)    Pt had BAD encephalopathy from Cefepime    Narda Bonds, PharmD, [place] Pharmacist Phone: [phone]

## 2020-08-04 NOTE — H&P (View-Only) (Signed)
    Subjective  -  Patient with fevers overnight   Physical Exam:  Progressive ischemic changes to toes on the right foot       Assessment/Plan:    I discussed with the patient that this is a limb threatening situation.  I would like to proceed with angiography given his change in clinical condition while he is in the hospital.  I will try to get this arranged for tomorrow.  He is also most likely going to need some form of digital amputation or possibly a transmetatarsal amputation.  He is certainly at risk for a more proximal transtibial amputation.  Patient understands this.  He will be n.p.o. after midnight.  Wells Chandelle Harkey 08/04/2020 11:35 AM --  Vitals:   08/04/20 0337 08/04/20 0724  BP: (!) 142/73 (!) 168/84  Pulse: 92 87  Resp: 18 18  Temp: 100.1 F (37.8 C) (!) 101.8 F (38.8 C)  SpO2: 99% 100%    Intake/Output Summary (Last 24 hours) at 08/04/2020 1135 Last data filed at 08/04/2020 0900 Gross per 24 hour  Intake 120 ml  Output --  Net 120 ml     Laboratory CBC    Component Value Date/Time   WBC 13.9 (H) 08/04/2020 0334   HGB 8.4 (L) 08/04/2020 0334   HGB 11.1 (L) 05/02/2020 1454   HCT 25.0 (L) 08/04/2020 0334   HCT 35.5 (L) 05/02/2020 1454   PLT 319 08/04/2020 0334   PLT 221 05/02/2020 1454    BMET    Component Value Date/Time   NA 139 08/04/2020 0334   NA 137 12/10/2019 1439   K 4.5 08/04/2020 0334   CL 99 08/04/2020 0334   CO2 27 08/04/2020 0334   GLUCOSE 180 (H) 08/04/2020 0334   BUN 36 (H) 08/04/2020 0334   BUN 80 (HH) 12/10/2019 1439   CREATININE 4.77 (H) 08/04/2020 0334   CALCIUM 8.7 (L) 08/04/2020 0334   GFRNONAA 12 (L) 08/04/2020 0334   GFRAA 19 (L) 04/05/2020 0705    COAG Lab Results  Component Value Date   INR 1.6 (H) 03/31/2020   INR 1.5 (H) 02/17/2020   INR 1.9 (H) 01/14/2020   No results found for: PTT  Antibiotics Anti-infectives (From admission, onward)   Start     Dose/Rate Route Frequency Ordered Stop    08/04/20 1200  vancomycin (VANCOCIN) IVPB 750 mg/150 ml premix        750 mg 150 mL/hr over 60 Minutes Intravenous Every T-Th-Sa (Hemodialysis) 08/04/20 0139     08/04/20 0230  vancomycin (VANCOREADY) IVPB 1500 mg/300 mL        1,500 mg 150 mL/hr over 120 Minutes Intravenous  Once 08/04/20 0139 08/04/20 0823   08/04/20 0230  piperacillin-tazobactam (ZOSYN) IVPB 3.375 g        3.375 g 12.5 mL/hr over 240 Minutes Intravenous Every 12 hours 08/04/20 0141         V. Leia Alf, M.D., [facility] [place] Office: [phone] Pager:  [phone]

## 2020-08-04 NOTE — Progress Notes (Addendum)
Paged by RN for fever 102.39F, patient has been having progressively worsening fevers throughout the day of 08/03/2020. Patient also had an episode of NBNB emesis earlier today, likely 2/2 fevers, but this has since resolved after receiving zofran. Blood cultures were obtained earlier when patient spiked low grade fever of 100.62F, still pending. His vasculature appears to be progressively worsening. Images below are worse when compared to those taken on day of admission. Furthermore, on date of admission, 3rd and 4th digits were fine, but today the web between the 3rd and 4th digits also appears to be affected. Very concerned about possible wet gangrene 2/2 worsening occlusion from his severe PAD. For his fevers, no apparent source aside from his right foot wounds. He also spiked a white count up to 15.9 earlier. Patient remains hemodynamically stable but has become tachycardic.    Plan: -f/u blood cultures -f/u lactate -f/u CXR -starting broad spectrum abx with IV zosyn and vanc for possible MRSA coverage (per pharmacy) -tylenol q4h for fevers -LR @75cc /hr for 6 hours to replenish insensible losses -informed RN to keep close eye on right foot and page if she notes any worsening -if CXR negative, will consult vascular for further evaluation of right foot

## 2020-08-05 ENCOUNTER — Inpatient Hospital Stay (HOSPITAL_COMMUNITY): Payer: Medicare PPO

## 2020-08-05 ENCOUNTER — Encounter (HOSPITAL_COMMUNITY): Payer: Self-pay | Admitting: Vascular Surgery

## 2020-08-05 ENCOUNTER — Encounter (HOSPITAL_COMMUNITY)
Admission: EM | Disposition: A | Discharge status: Skilled Nursing Facility | Payer: Self-pay | Source: Home / Self Care | Attending: Internal Medicine

## 2020-08-05 DIAGNOSIS — R7881 Bacteremia: Secondary | ICD-10-CM

## 2020-08-05 DIAGNOSIS — I5042 Chronic combined systolic (congestive) and diastolic (congestive) heart failure: Secondary | ICD-10-CM | POA: Diagnosis not present

## 2020-08-05 DIAGNOSIS — I639 Cerebral infarction, unspecified: Secondary | ICD-10-CM | POA: Diagnosis not present

## 2020-08-05 DIAGNOSIS — D649 Anemia, unspecified: Secondary | ICD-10-CM | POA: Diagnosis not present

## 2020-08-05 DIAGNOSIS — I96 Gangrene, not elsewhere classified: Secondary | ICD-10-CM

## 2020-08-05 HISTORY — PX: ABDOMINAL AORTOGRAM W/LOWER EXTREMITY: CATH118223

## 2020-08-05 HISTORY — PX: PERIPHERAL VASCULAR INTERVENTION: CATH118257

## 2020-08-05 LAB — RENAL FUNCTION PANEL
Albumin: 2.3 g/dL — ABNORMAL LOW (ref 3.5–5.0)
Anion gap: 13 (ref 5–15)
BUN: 29 mg/dL — ABNORMAL HIGH (ref 8–23)
CO2: 27 mmol/L (ref 22–32)
Calcium: 8.6 mg/dL — ABNORMAL LOW (ref 8.9–10.3)
Chloride: 98 mmol/L (ref 98–111)
Creatinine, Ser: 4.19 mg/dL — ABNORMAL HIGH (ref 0.61–1.24)
GFR, Estimated: 15 mL/min — ABNORMAL LOW (ref >60)
Glucose, Bld: 130 mg/dL — ABNORMAL HIGH (ref 70–99)
Phosphorus: 4 mg/dL (ref 2.5–4.6)
Potassium: 3.9 mmol/L (ref 3.5–5.1)
Sodium: 138 mmol/L (ref 135–145)

## 2020-08-05 LAB — CBC WITH DIFFERENTIAL/PLATELET
Abs Immature Granulocytes: 0.05 10*3/uL (ref 0.00–0.07)
Basophils Absolute: 0 10*3/uL (ref 0.0–0.1)
Basophils Relative: 0 %
Eosinophils Absolute: 0 10*3/uL (ref 0.0–0.5)
Eosinophils Relative: 0 %
HCT: 25.3 % — ABNORMAL LOW (ref 39.0–52.0)
Hemoglobin: 8.5 g/dL — ABNORMAL LOW (ref 13.0–17.0)
Immature Granulocytes: 0 %
Lymphocytes Relative: 9 %
Lymphs Abs: 1 10*3/uL (ref 0.7–4.0)
MCH: 30.9 pg (ref 26.0–34.0)
MCHC: 33.6 g/dL (ref 30.0–36.0)
MCV: 92 fL (ref 80.0–100.0)
Monocytes Absolute: 0.8 10*3/uL (ref 0.1–1.0)
Monocytes Relative: 7 %
Neutro Abs: 9.8 10*3/uL — ABNORMAL HIGH (ref 1.7–7.7)
Neutrophils Relative %: 84 %
Platelets: 302 10*3/uL (ref 150–400)
RBC: 2.75 MIL/uL — ABNORMAL LOW (ref 4.22–5.81)
RDW: 14.1 % (ref 11.5–15.5)
WBC: 11.7 10*3/uL — ABNORMAL HIGH (ref 4.0–10.5)
nRBC: 0 % (ref 0.0–0.2)

## 2020-08-05 LAB — ECHOCARDIOGRAM COMPLETE
Area-P 1/2: 2.52 cm2
Height: 72 in
S' Lateral: 3.4 cm
Weight: 2469.15 oz

## 2020-08-05 LAB — GLUCOSE, CAPILLARY
Glucose-Capillary: 112 mg/dL — ABNORMAL HIGH (ref 70–99)
Glucose-Capillary: 128 mg/dL — ABNORMAL HIGH (ref 70–99)
Glucose-Capillary: 143 mg/dL — ABNORMAL HIGH (ref 70–99)
Glucose-Capillary: 123 mg/dL — ABNORMAL HIGH (ref 70–99)

## 2020-08-05 LAB — POCT ACTIVATED CLOTTING TIME
Activated Clotting Time: 232 seconds
Activated Clotting Time: 237 seconds
Activated Clotting Time: 178 seconds
Activated Clotting Time: 196 seconds

## 2020-08-05 LAB — MRSA PCR SCREENING: MRSA by PCR: NEGATIVE

## 2020-08-05 SURGERY — ABDOMINAL AORTOGRAM W/LOWER EXTREMITY
Anesthesia: LOCAL | Laterality: Right

## 2020-08-05 MED ORDER — HEPARIN SODIUM (PORCINE) 1000 UNIT/ML IJ SOLN
INTRAMUSCULAR | Status: AC
  Filled 2020-08-05: qty 1

## 2020-08-05 MED ORDER — LABETALOL HCL 5 MG/ML IV SOLN
INTRAVENOUS | Status: AC
  Filled 2020-08-05: qty 4

## 2020-08-05 MED ORDER — HEPARIN (PORCINE) IN NACL 1000-0.9 UT/500ML-% IV SOLN
INTRAVENOUS | Status: AC
  Filled 2020-08-05: qty 1000

## 2020-08-05 MED ORDER — MIDAZOLAM HCL 2 MG/2ML IJ SOLN
INTRAMUSCULAR | Status: DC | PRN
  Administered 2020-08-05: 0.5 mg via INTRAVENOUS

## 2020-08-05 MED ORDER — HYDROMORPHONE HCL 1 MG/ML IJ SOLN
0.5000 mg | Freq: Once | INTRAMUSCULAR | Status: AC
  Administered 2020-08-05: 0.5 mg via INTRAVENOUS
  Filled 2020-08-05: qty 1

## 2020-08-05 MED ORDER — ONDANSETRON HCL 4 MG/2ML IJ SOLN
4.0000 mg | Freq: Four times a day (QID) | INTRAMUSCULAR | Status: DC | PRN
  Administered 2020-08-09 – 2020-08-10 (×2): 4 mg via INTRAVENOUS
  Filled 2020-08-05 (×2): qty 2

## 2020-08-05 MED ORDER — HEPARIN (PORCINE) IN NACL 1000-0.9 UT/500ML-% IV SOLN
INTRAVENOUS | Status: DC | PRN
  Administered 2020-08-05 (×2): 500 mL

## 2020-08-05 MED ORDER — HEPARIN (PORCINE) IN NACL 1000-0.9 UT/500ML-% IV SOLN
INTRAVENOUS | Status: DC | PRN
Start: 2020-08-05 — End: 2020-08-05
  Administered 2020-08-05 (×2): 500 mL

## 2020-08-05 MED ORDER — LIDOCAINE HCL (PF) 1 % IJ SOLN
INTRAMUSCULAR | Status: AC
  Filled 2020-08-05: qty 30

## 2020-08-05 MED ORDER — HEPARIN SODIUM (PORCINE) 5000 UNIT/ML IJ SOLN
5000.0000 [IU] | Freq: Three times a day (TID) | INTRAMUSCULAR | Status: DC
  Administered 2020-08-05 – 2020-08-08 (×9): 5000 [IU] via SUBCUTANEOUS
  Filled 2020-08-05 (×10): qty 1

## 2020-08-05 MED ORDER — HYDRALAZINE HCL 20 MG/ML IJ SOLN: 5.0000 mg | INTRAMUSCULAR | Status: DC | PRN

## 2020-08-05 MED ORDER — FENTANYL CITRATE (PF) 100 MCG/2ML IJ SOLN
INTRAMUSCULAR | Status: DC | PRN
  Administered 2020-08-05: 50 ug via INTRAVENOUS

## 2020-08-05 MED ORDER — LABETALOL HCL 5 MG/ML IV SOLN
10.0000 mg | INTRAVENOUS | Status: DC | PRN
  Administered 2020-08-05: 13:00:00 10 mg via INTRAVENOUS

## 2020-08-05 MED ORDER — ACETAMINOPHEN 325 MG PO TABS
650.0000 mg | ORAL_TABLET | ORAL | Status: DC | PRN
  Administered 2020-08-12: 650 mg via ORAL

## 2020-08-05 MED ORDER — IODIXANOL 320 MG/ML IV SOLN
INTRAVENOUS | Status: DC | PRN
  Administered 2020-08-05: 100 mL via INTRA_ARTERIAL

## 2020-08-05 MED ORDER — SODIUM CHLORIDE 0.9 % IV SOLN
250.0000 mL | INTRAVENOUS | Status: DC | PRN
  Administered 2020-08-06: 250 mL via INTRAVENOUS

## 2020-08-05 MED ORDER — LIDOCAINE HCL (PF) 1 % IJ SOLN
INTRAMUSCULAR | Status: DC | PRN
  Administered 2020-08-05: 15 mL via INTRADERMAL

## 2020-08-05 MED ORDER — HEPARIN SODIUM (PORCINE) 1000 UNIT/ML IJ SOLN
INTRAMUSCULAR | Status: DC | PRN
  Administered 2020-08-05: 1000 [IU] via INTRAVENOUS
  Administered 2020-08-05: 7000 [IU] via INTRAVENOUS

## 2020-08-05 MED ORDER — SODIUM CHLORIDE 0.9% FLUSH
3.0000 mL | Freq: Two times a day (BID) | INTRAVENOUS | Status: DC
  Administered 2020-08-05 – 2020-08-19 (×16): 3 mL via INTRAVENOUS

## 2020-08-05 MED ORDER — MIDAZOLAM HCL 2 MG/2ML IJ SOLN
INTRAMUSCULAR | Status: AC
  Filled 2020-08-05: qty 2

## 2020-08-05 MED ORDER — FENTANYL CITRATE (PF) 100 MCG/2ML IJ SOLN
INTRAMUSCULAR | Status: AC
  Filled 2020-08-05: qty 2

## 2020-08-05 MED ORDER — DARBEPOETIN ALFA 40 MCG/0.4ML IJ SOSY
40.0000 ug | PREFILLED_SYRINGE | INTRAMUSCULAR | Status: DC
  Administered 2020-08-13: 40 ug via INTRAVENOUS

## 2020-08-05 MED ORDER — ACETAMINOPHEN 325 MG PO TABS: 650.0000 mg | ORAL_TABLET | ORAL | Status: DC | PRN

## 2020-08-05 MED ORDER — SODIUM CHLORIDE 0.9% FLUSH: 3.0000 mL | INTRAVENOUS | Status: DC | PRN

## 2020-08-05 SURGICAL SUPPLY — 20 items
BALLN COYOTE OTW 2.5X220X150 (BALLOONS) ×3
BALLOON COYOTE OTW 2.5X220X150 (BALLOONS) ×2 IMPLANT
CATH ANGIO 5F PIGTAIL 65CM (CATHETERS) ×3 IMPLANT
CATH CROSS OVER TEMPO 5F (CATHETERS) ×3 IMPLANT
CATH QUICKCROSS .018X135CM (MICROCATHETER) ×3 IMPLANT
CATH STRAIGHT 5FR 65CM (CATHETERS) ×3 IMPLANT
KIT ENCORE 26 ADVANTAGE (KITS) ×3 IMPLANT
KIT MICROPUNCTURE NIT STIFF (SHEATH) ×3 IMPLANT
KIT PV (KITS) ×3 IMPLANT
SHEATH PINNACLE 5F 10CM (SHEATH) ×3 IMPLANT
SHEATH PINNACLE 6F 10CM (SHEATH) ×3 IMPLANT
SHEATH PINNACLE ST 6F 45CM (SHEATH) ×3 IMPLANT
SHEATH PROBE COVER 6X72 (BAG) ×3 IMPLANT
SYR MEDRAD MARK 7 150ML (SYRINGE) ×3 IMPLANT
TRANSDUCER W/STOPCOCK (MISCELLANEOUS) ×3 IMPLANT
TRAY PV CATH (CUSTOM PROCEDURE TRAY) ×3 IMPLANT
WIRE G V18X300CM (WIRE) ×3 IMPLANT
WIRE HITORQ VERSACORE ST 145CM (WIRE) ×3 IMPLANT
WIRE ROSEN-J .035X180CM (WIRE) ×3 IMPLANT
WIRE SPARTACORE .014X300CM (WIRE) ×3 IMPLANT

## 2020-08-05 NOTE — Progress Notes (Signed)
Echocardiogram 2D Echocardiogram has been performed.  Oneal Deputy Jarrid Lienhard 08/05/2020, 3:50 PM

## 2020-08-05 NOTE — Progress Notes (Signed)
HD#7 Subjective:   Overnight, Tmax 38.6. Vitals otherwise stable.  During evaluation this morning, patient states he is "oh fine." Reports he worked with physical therapy this morning. He denies pain or general discomfort. Expresses understanding of plan for aortogram and echo today.  Objective:   Vital signs in last 24 hours: Vitals:   08/04/20 1800 08/04/20 2114 08/05/20 0026 08/05/20 0421  BP: 115/77 130/66 119/62 (!) 147/68  Pulse: 90 85 80 83  Resp: 16 18 18 18   Temp: 100 F (37.8 C) (!) 101.4 F (38.6 C) 99.7 F (37.6 C) (!) 101.1 F (38.4 C)  TempSrc:   Oral Oral  SpO2: 99% 96% 97% 98%  Weight:      Height:       Physical Exam Constitutional:chronically ill-appearingmanlying bed, in no acute distress Cardiovascular: regular rate and rhythm, no m/r/g Pulmonary/Chest: normal work of breathing on room air, lungs clear to auscultation bilaterally VFI:EPPIRJ build, L BKA  Skin:RLE cool and dry. Exam stable from yesterday. See Media tab for 1/27 AM picture.  Pertinent Labs: CBC Latest Ref Rng & Units 08/04/2020 08/04/2020 08/03/2020  WBC 4.0 - 10.5 K/uL 11.1(H) 13.9(H) 15.9(H)  Hemoglobin 13.0 - 17.0 g/dL 8.3(L) 8.4(L) 9.5(L)  Hematocrit 39.0 - 52.0 % 24.6(L) 25.0(L) 28.7(L)  Platelets 150 - 400 K/uL 315 319 323    CMP Latest Ref Rng & Units 08/04/2020 08/04/2020 08/03/2020  Glucose 70 - 99 mg/dL 158(H) 180(H) 236(H)  BUN 8 - 23 mg/dL 24(H) 36(H) 23  Creatinine 0.61 - 1.24 mg/dL 3.54(H) 4.77(H) 2.78(H)  Sodium 135 - 145 mmol/L 137 139 136  Potassium 3.5 - 5.1 mmol/L 4.7 4.5 4.2  Chloride 98 - 111 mmol/L 98 99 99  CO2 22 - 32 mmol/L 25 27 26   Calcium 8.9 - 10.3 mg/dL 8.6(L) 8.7(L) 8.0(L)  Total Protein 6.5 - 8.1 g/dL - 6.2(L) -  Total Bilirubin 0.3 - 1.2 mg/dL - 0.7 -  Alkaline Phos 38 - 126 U/L - 69 -  AST 15 - 41 U/L - 22 -  ALT 0 - 44 U/L - 10 -    Imaging: No results found.  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 man with history of ESRD on HD TTS, type 2 diabetes mellitus, history of CVA, hypertension, heart failure, and peripheral vascular disease on dual antiplatelet therapy who presented after syncopal episode at dialysis and admitted for further work-up and evaluation.   This is hospital day 7.   Sepsis secondary to MSSA bacteremia Patient with persistent fever overnight (Tmax 38.4). Vitals otherwise stable without tachycardia or hypotension. Leukocytosis stable from yesterday (11.7). 3 out of 4 blood cultures collected 1/26 at 6 pm with staph aureus, susceptibilities still pending. ID consulted. Suspected source of bacteremia is patient's ischemic toes (see below). - ID consulted, appreciate recs - IV cefazolin (  Day 1: 1/27)  - Repeat blood cultures collected 1/27 - f/u 1/26 blood culture susceptibilities  - TTE ordered, also TEE next week - Angiography per vascular surgery today  R 1st and 2nd digit ulcerations History of Peripheral vascular disease s/p L BKA On dual antiplatelet platelet therapy due to PVD.  - Vascular surgery consulted, appreciate their expertise - Angiography today  Syncopal episode Post-HD hypotension ESRD on HD (TTS) Acute ischemic left thalamic infarct History of multiple prior CVAs (2017, 2021) HTN, HLD Patient has not had another episode of bradycardia/syncope since HD on  Mon (1/24). Last HD yesterday [facility] Dick 1/27).  - Next HD tomorrow (1/29) - Continue home ASA and Plavix - Continue atorvastatin 80 mg   Acute on chronic anemia FOBT positive Nonerosive esophagitis Hgb stable at 8.5.  - Continue Protonix 40 mg daily indefinitely - AM CBC  Type 2 diabetes mellitus - Lantus 7 units - SSI sensitive - Carb/renal diet if not NPO   Diet: NPO  IVF: None VTE: SCDs Code: Full PT/OT recs: SNF for Subacute PT or HHPT if patient refuses rehab placement   Please contact the on call pager after 5 pm and on weekends at [phone].  [name], MD PGY-1 Internal Medicine Teaching Service Pager: [phone] 08/05/2020

## 2020-08-05 NOTE — Interval H&P Note (Signed)
History and Physical Interval Note:  08/05/2020 10:53 AM  Dennis Macias  has presented today for surgery, with the diagnosis of peripheral vascular disease.  The various methods of treatment have been discussed with the patient and family. After consideration of risks, benefits and other options for treatment, the patient has consented to  Procedure(s): ABDOMINAL AORTOGRAM W/LOWER EXTREMITY (N/A) as a surgical intervention.  The patient's history has been reviewed, patient examined, no change in status, stable for surgery.  I have reviewed the patient's chart and labs.  Questions were answered to the patient's satisfaction.     Deitra Mayo

## 2020-08-05 NOTE — Progress Notes (Signed)
Site area: Left groin a 6 french arterial sheath was removed  Site Prior to Removal:  Level 0  Pressure Applied For 20 MINUTES    Bedrest Beginning at 1500pm X 4 hours  Manual:   Yes.    Patient Status During Pull:  stable  Post Pull Groin Site:  Level 0  Post Pull Instructions Given:  Yes.    Post Pull Pulses Present:  Yes.    Dressing Applied:  Yes.    Comments:

## 2020-08-05 NOTE — Plan of Care (Signed)
  Problem: Education: Goal: Knowledge of disease or condition will improve Outcome: Progressing Goal: Knowledge of secondary prevention will improve Outcome: Progressing Goal: Knowledge of patient specific risk factors addressed and post discharge goals established will improve Outcome: Progressing Goal: Individualized Educational Video(s) Outcome: Progressing   Problem: Coping: Goal: Will verbalize positive feelings about self Outcome: Progressing Goal: Will identify appropriate support needs Outcome: Progressing   Problem: Health Behavior/Discharge Planning: Goal: Ability to manage health-related needs will improve Outcome: Progressing   Problem: Self-Care: Goal: Ability to participate in self-care as condition permits will improve Outcome: Progressing Goal: Verbalization of feelings and concerns over difficulty with self-care will improve Outcome: Progressing Goal: Ability to communicate needs accurately will improve Outcome: Progressing   Problem: Nutrition: Goal: Risk of aspiration will decrease Outcome: Progressing Goal: Dietary intake will improve Outcome: Progressing   Problem: Ischemic Stroke/TIA Tissue Perfusion: Goal: Complications of ischemic stroke/TIA will be minimized Outcome: Progressing   Problem: Education: Goal: Knowledge of General Education information will improve Description: Including pain rating scale, medication(s)/side effects and non-pharmacologic comfort measures Outcome: Progressing   Problem: Health Behavior/Discharge Planning: Goal: Ability to manage health-related needs will improve Outcome: Progressing   Problem: Clinical Measurements: Goal: Ability to maintain clinical measurements within normal limits will improve Outcome: Progressing Goal: Will remain free from infection Outcome: Progressing Goal: Diagnostic test results will improve Outcome: Progressing Goal: Respiratory complications will improve Outcome: Progressing Goal:  Cardiovascular complication will be avoided Outcome: Progressing   Problem: Activity: Goal: Risk for activity intolerance will decrease Outcome: Progressing   Problem: Nutrition: Goal: Adequate nutrition will be maintained Outcome: Progressing   Problem: Coping: Goal: Level of anxiety will decrease Outcome: Progressing   Problem: Elimination: Goal: Will not experience complications related to bowel motility Outcome: Progressing Goal: Will not experience complications related to urinary retention Outcome: Progressing   Problem: Pain Managment: Goal: General experience of comfort will improve Outcome: Progressing   Problem: Safety: Goal: Ability to remain free from injury will improve Outcome: Progressing   Problem: Skin Integrity: Goal: Risk for impaired skin integrity will decrease Outcome: Progressing   Problem: Education: Goal: Understanding of CV disease, CV risk reduction, and recovery process will improve Outcome: Progressing Goal: Individualized Educational Video(s) Outcome: Progressing   Problem: Activity: Goal: Ability to return to baseline activity level will improve Outcome: Progressing   Problem: Cardiovascular: Goal: Ability to achieve and maintain adequate cardiovascular perfusion will improve Outcome: Progressing Goal: Vascular access site(s) Level 0-1 will be maintained Outcome: Progressing   Problem: Health Behavior/Discharge Planning: Goal: Ability to safely manage health-related needs after discharge will improve Outcome: Progressing

## 2020-08-05 NOTE — Progress Notes (Signed)
Patient has temp of 101.6. Tylenol given temp recheck. Current temp is 100.0. MD, Shon Baton made aware

## 2020-08-05 NOTE — Progress Notes (Signed)
    [facility] has been requested to perform a transesophageal echocardiogram on Dennis Macias for Bacteremia.  After careful review of history and examination, the risks and benefits of transesophageal echocardiogram have been explained including risks of esophageal damage, perforation (1:10,000 risk), bleeding, pharyngeal hematoma as well as other potential complications associated with conscious sedation including aspiration, arrhythmia, respiratory failure and death. Alternatives to treatment were discussed, questions were answered. Patient is willing to proceed.  TEE - Dr. Margaretann Loveless 08/08/20 @ 1330. NPO after midnight. Meds with sips.   Dennis Kail, PA-C 08/05/2020 5:45 PM

## 2020-08-05 NOTE — Progress Notes (Addendum)
[facility] Progress Note   Subjective: Awake, alert, no C/Os. Going for angiography/possible TMA per [facility] today. TMax 102.1 overnight.   Objective Vitals:   08/04/20 2114 08/05/20 0026 08/05/20 0421 08/05/20 0836  BP: 130/66 119/62 (!) 147/68 128/73  Pulse: 85 80 83 77  Resp: 18 18 18 20   Temp: (!) 101.4 F (38.6 C) 99.7 F (37.6 C) (!) 101.1 F (38.4 C) 99 F (37.2 C)  TempSrc:  Oral Oral Oral  SpO2: 96% 97% 98% 100%  Weight:      Height:       Physical Exam General: Chronically ill appearing male in NAD Heart: S1.S2 RRR  Lungs: CTAB A/P Abdomen: S,. NT Extremities: R foot with necrotic appearing 2nd toe, No LE edema Dialysis Access: R AVF + T/B    Additional Objective Labs: Basic Metabolic Panel: Recent Labs  Lab 08/03/20 0043 08/04/20 0334 08/04/20 2018 08/05/20 0544  NA 136 139 137 138  K 4.2 4.5 4.7 3.9  CL 99 99 98 98  CO2 26 27 25 27   GLUCOSE 236* 180* 158* 130*  BUN 23 36* 24* 29*  CREATININE 2.78* 4.77* 3.54* 4.19*  CALCIUM 8.0* 8.7* 8.6* 8.6*  PHOS 2.2*  --  3.1 4.0   Liver Function Tests: Recent Labs  Lab 08/04/20 0334 08/04/20 2018 08/05/20 0544  AST 22  --   --   ALT 10  --   --   ALKPHOS 69  --   --   BILITOT 0.7  --   --   PROT 6.2*  --   --   ALBUMIN 2.5* 2.4* 2.3*   No results for input(s): LIPASE, AMYLASE in the last 168 hours. CBC: Recent Labs  Lab 08/03/20 0043 08/03/20 1129 08/03/20 1958 08/04/20 0334 08/04/20 2018 08/05/20 0544  WBC 8.2 8.8 15.9* 13.9* 11.1* 11.7*  NEUTROABS 6.0  --  13.7*  --   --  9.8*  HGB 8.2* 8.2* 9.5* 8.4* 8.3* 8.5*  HCT 24.9* 24.8* 28.7* 25.0* 24.6* 25.3*  MCV 90.9 90.5 91.7 90.6 90.4 92.0  PLT 253 287 323 319 315 302   Blood Culture    Component Value Date/Time   SDES BLOOD SITE NOT SPECIFIED 08/03/2020 1803   SPECREQUEST  08/03/2020 1803    BOTTLES DRAWN AEROBIC AND ANAEROBIC Blood Culture adequate volume   CULT GRAM POSITIVE COCCI 08/03/2020 1803   REPTSTATUS  PENDING 08/03/2020 1803    Cardiac Enzymes: No results for input(s): CKTOTAL, CKMB, CKMBINDEX, TROPONINI in the last 168 hours. CBG: Recent Labs  Lab 08/04/20 1133 08/04/20 1750 08/04/20 2005 08/04/20 2334 08/05/20 0632  GLUCAP 176* 122* 151* 125* 112*   Iron Studies: No results for input(s): IRON, TIBC, TRANSFERRIN, FERRITIN in the last 72 hours. @lablastinr3 @ Studies/Results: DG CHEST PORT 1 VIEW  Result Date: 08/04/2020 CLINICAL DATA:  70 year old male with fever. Left thalamic infarct. Recent syncope, fall. EXAM: PORTABLE CHEST 1 VIEW COMPARISON:  Portable chest 07/28/2020 and earlier. FINDINGS: Portable AP semi upright view at 0437 hours. Larger lung volumes. Stable mediastinal contours with mild tortuosity of the thoracic aorta, heart size within normal limits. Visualized tracheal air column is within normal limits. Allowing for portable technique the lungs are clear. No pneumothorax. Negative visible bowel gas pattern. No acute osseous abnormality identified. IMPRESSION: No acute cardiopulmonary abnormality. Electronically Signed   By: Genevie Ann M.D.   On: 08/04/2020 06:01   Medications: .  ceFAZolin (ANCEF) IV 2 g (08/04/20 2324)   . sodium chloride  Intravenous Once  . aspirin EC  81 mg Oral Daily  . atorvastatin  80 mg Oral QHS  . Chlorhexidine Gluconate Cloth  6 each Topical Q0600  . Chlorhexidine Gluconate Cloth  6 each Topical Q0600  . clopidogrel  75 mg Oral Daily  . docusate sodium  100 mg Oral Daily  . dorzolamide-timolol  1 drop Right Eye BID  . finasteride  5 mg Oral Daily  . insulin aspart  0-6 Units Subcutaneous TID WC  . insulin glargine  7 Units Subcutaneous QHS  . pantoprazole  40 mg Oral Daily  . sodium chloride flush  3 mL Intravenous Q12H     Dialysis Orders: TTS -[facility] 4hrs, BFR400, [id number], EDW 69kg,2K/2.25Ca Access:RU AVF Heparin4000 unit bolus Hectorol35mcg IV qHD  Assessment/Plan: 1. Syncopal episode -likely  multifactorial - acute CVA, hypotension, and acute on chronic anemia.Had another episode of bradycardia/ syncope on HDMonday. Not volume overloaded. Minimal UF with HD. 2. Acute CVA- seen on MRI. Thought to be related to cerebral hypoperfusion with hypotension post HD, as this was the cause of a past stroke. Per neuro/Admit 3. R 1st & 2nd digit ulcerations/Hx PVD: S/p RLE balloon angioplasty to PTA 05/29/20.VVS consulted, planfor arteriogram today, possible TMA.   4. ESRD:Continue TTS schedule. HD treatments shortened due to high patient census/staffing shortage. Not uremic or volume overloaded on exam. Planned for HD 08/06/20  5. Hypertension/volume: BP controlled, no volumeoverloadon exam.Clotted dialyzer 01/27 only -113 ml. .No post wt. UF as tolerated.  6. Acute on chronic Anemiaof CKD:+ FOBT, Hgb drop 11.3 (on 1/13 as OP) to 7.4, s/p 1 unit pRBC1/20and another unit on 1/24.GI consulted,underwent EGD1/25which showed esophagitis.Hgb now 8.5. Give ESA with HD tomorrow. . 7. Secondary Hyperparathyroidism:Corrected calcium9.9, has been running high,Phos2.2.Binders discontinued.Hectorolon hold, follow Ca. 8. Nutrition- Renal diet w/fluid restrictions.  9. DMT2 10. Fever: unclear cause, on empiric antibiotics. Per primary.   Rita H. Brown NP-C 08/05/2020, 8:59 AM  [facility] [phone]  Pt seen, examined and agree w A/P as above.  Kelly Splinter  MD 08/05/2020, 4:23 PM

## 2020-08-05 NOTE — Op Note (Signed)
PATIENT: Dennis Macias      MRN: [id number] DOB: July 13, 1950    DATE OF PROCEDURE: 08/05/2020  INDICATIONS:    Dennis Macias is a 70 y.o. male with a left below the knee amputation.  He has a nonhealing wound on his right foot with gangrene of the second toe.  He is undergone previous tibial angioplasty in November of last year.  Given that the wound did not seem to be improving he was set up for arteriography and possible intervention.  PROCEDURE:    1.  Conscious sedation 2.  Ultrasound-guided access to the left common femoral artery 3.  Selective catheterization of the right external iliac artery with right lower extremity runoff (second order catheterization) 4.  Selective catheterization of the posterior tibial artery with angioplasty of the right posterior tibial artery using a 2.5 x 220 balloon   SURGEON: Judeth Cornfield. Scot Dock, MD, FACS  ANESTHESIA: Local with sedation  EBL: Minimal  TECHNIQUE: The patient was brought to the peripheral vascular lab and was sedated. The period of conscious sedation was 54 minutes.  During that time period, I was present face-to-face 100% of the time.  The patient was administered half milligram of Versed and 50 mcg of fentanyl. The patient's heart rate, blood pressure, and oxygen saturation were monitored by the nurse continuously during the procedure.  Both groins were prepped and draped in the usual sterile fashion.  Under ultrasound guidance, after the skin was anesthetized, I cannulated the left common femoral artery with a micropuncture needle and a micropuncture sheath was introduced over a wire.  This was exchanged for a 5 [place] sheath over a Bentson wire.  By ultrasound the femoral artery was patent. A real-time image was obtained and sent to the server.  The pigtail catheter was positioned at the L1 vertebral body and flush aortogram obtained.  I then exchanged the pigtail catheter for a crossover catheter which was positioned into the right  common iliac artery.  The wire was advanced down into the external iliac artery and then the straight catheter advanced over the wire.  Right lower extremity arteriogram was obtained.  This showed that the posterior tibial artery had occluded.  The anterior tibial was patent to the foot and was occluded the peroneal artery was occluded proximally.  I elected to intervene on the posterior tibial artery.  A Rosen wire was advanced down into the superficial femoral artery on the right and then the catheter was removed.  I then exchanged the short 5 French sheath for a 65 cm 6 [place] destination sheath which was positioned into the superficial femoral artery.  I then used a quick cross catheter with a V 18 wire to cross the posterior tibial artery occlusion and get down into the distal posterior tibial artery.  The quick cross catheter was advanced over the wire the wire removed and contrast was injected to be sure that I was intraluminal.  I then selected a 2.5 mm x 22 centimeter balloon.  This was passed down to the calcaneus and inflated to 14 atm for 1 minute.  I then brought this back to overlap with the tibial peroneal trunk and this was inflated again to 14 atm for 1 minute.  Completion film showed an excellent result with a patent posterior tibial artery and runoff into the foot.  I then brought the sheath back to the external iliac artery on the left and the long destination sheath was exchanged for a short 6 [place] sheath.  The patient was transferred to the holding area for removal of the sheath.  No immediate complications were noted.  FINDINGS:   PRE: 100% POST: Less than 15% STENT: No  Deitra Mayo, MD, FACS [facility]  DATE OF DICTATION:   08/05/2020

## 2020-08-05 NOTE — Progress Notes (Signed)
Subjective: No new complaints. Patient was evaluated after returning from cath lab. He denies any fever, chills, SOB, CP or leg pain. He informed me that he was told that they will have to take off some of his toes.   Antibiotics:  Anti-infectives (From admission, onward)   Start     Dose/Rate Route Frequency Ordered Stop   08/04/20 1800  ceFAZolin (ANCEF) IVPB 2g/100 mL premix        2 g 200 mL/hr over 30 Minutes Intravenous Every T-Th-Sa (1800) 08/04/20 1244     08/04/20 1200  vancomycin (VANCOCIN) IVPB 750 mg/150 ml premix  Status:  Discontinued        750 mg 150 mL/hr over 60 Minutes Intravenous Every T-Th-Sa (Hemodialysis) 08/04/20 0139 08/04/20 1244   08/04/20 0230  vancomycin (VANCOREADY) IVPB 1500 mg/300 mL        1,500 mg 150 mL/hr over 120 Minutes Intravenous  Once 08/04/20 0139 08/04/20 0823   08/04/20 0230  piperacillin-tazobactam (ZOSYN) IVPB 3.375 g  Status:  Discontinued        3.375 g 12.5 mL/hr over 240 Minutes Intravenous Every 12 hours 08/04/20 0141 08/04/20 1244     Medications: Scheduled Meds: . sodium chloride   Intravenous Once  . aspirin EC  81 mg Oral Daily  . atorvastatin  80 mg Oral QHS  . Chlorhexidine Gluconate Cloth  6 each Topical Q0600  . Chlorhexidine Gluconate Cloth  6 each Topical Q0600  . clopidogrel  75 mg Oral Daily  . docusate sodium  100 mg Oral Daily  . dorzolamide-timolol  1 drop Right Eye BID  . finasteride  5 mg Oral Daily  . insulin aspart  0-6 Units Subcutaneous TID WC  . insulin glargine  7 Units Subcutaneous QHS  . pantoprazole  40 mg Oral Daily  . sodium chloride flush  3 mL Intravenous Q12H   Continuous Infusions: .  ceFAZolin (ANCEF) IV 2 g (08/04/20 2324)   PRN Meds:.acetaminophen **OR** acetaminophen, ondansetron (ZOFRAN) IV  Objective: Weight change:   Intake/Output Summary (Last 24 hours) at 08/05/2020 0829 Last data filed at 08/04/2020 1641 Gross per 24 hour  Intake 120 ml  Output -113 ml  Net 233 ml    Blood pressure (!) 155/54, pulse 69, temperature 99 F (37.2 C), temperature source Oral, resp. rate 12, height 6' (1.829 m), weight 70 kg, SpO2 100 %. Temp:  [98.3 F (36.8 C)-102.1 F (38.9 C)] 101.1 F (38.4 C) (01/28 0421) Pulse Rate:  [74-92] 83 (01/28 0421) Resp:  [14-20] 18 (01/28 0421) BP: (108-147)/(59-77) 147/68 (01/28 0421) SpO2:  [94 %-100 %] 98 % (01/28 0421)  Physical Exam: Physical Exam Constitutional:      Appearance: Normal appearance.  Eyes:     Conjunctiva/sclera: Conjunctivae normal.  Cardiovascular:     Rate and Rhythm: Normal rate and regular rhythm.     Comments: Dorsalis pedis pulse still faint.  Pulmonary:     Effort: Pulmonary effort is normal.     Breath sounds: Normal breath sounds.  Abdominal:     General: Bowel sounds are normal.     Palpations: Abdomen is soft.  Musculoskeletal:        General: Normal range of motion.     Cervical back: Normal range of motion.     Comments: Left BKA w/ no signs of stump infection. Dry gangrene of R great toe and 2nd digit.   Skin:    General: Skin is warm and  dry.  Neurological:     Mental Status: He is alert and oriented to person, place, and time.     CBC: CBC Latest Ref Rng & Units 08/05/2020 08/04/2020 08/04/2020  WBC 4.0 - 10.5 K/uL 11.7(H) 11.1(H) 13.9(H)  Hemoglobin 13.0 - 17.0 g/dL 8.5(L) 8.3(L) 8.4(L)  Hematocrit 39.0 - 52.0 % 25.3(L) 24.6(L) 25.0(L)  Platelets 150 - 400 K/uL 302 315 319    BMET Recent Labs    08/04/20 2018 08/05/20 0544  NA 137 138  K 4.7 3.9  CL 98 98  CO2 25 27  GLUCOSE 158* 130*  BUN 24* 29*  CREATININE 3.54* 4.19*  CALCIUM 8.6* 8.6*    Liver Panel Recent Labs    08/04/20 0334 08/04/20 2018 08/05/20 0544  PROT 6.2*  --   --   ALBUMIN 2.5* 2.4* 2.3*  AST 22  --   --   ALT 10  --   --   ALKPHOS 69  --   --   BILITOT 0.7  --   --     Sedimentation Rate Recent Labs    08/04/20 1446  ESRSEDRATE 131*   C-Reactive Protein Recent Labs     08/04/20 1446  CRP 4.8*    Micro Results: Recent Results (from the past 720 hour(s))  SARS CORONAVIRUS 2 (TAT 6-24 HRS) Nasopharyngeal Nasopharyngeal Swab     Status: None   Collection Time: 07/28/20  7:01 PM   Specimen: Nasopharyngeal Swab  Result Value Ref Range Status   SARS Coronavirus 2 NEGATIVE NEGATIVE Final    Comment: (NOTE) SARS-CoV-2 target nucleic acids are NOT DETECTED.  The SARS-CoV-2 RNA is generally detectable in upper and lower respiratory specimens during the acute phase of infection. Negative results do not preclude SARS-CoV-2 infection, do not rule out co-infections with other pathogens, and should not be used as the sole basis for treatment or other patient management decisions. Negative results must be combined with clinical observations, patient history, and epidemiological information. The expected result is Negative.  Fact Sheet for Patients: SugarRoll.be  Fact Sheet for Healthcare Providers: https://www.woods-mathews.com/  This test is not yet approved or cleared by the [place] FDA and  has been authorized for detection and/or diagnosis of SARS-CoV-2 by FDA under an Emergency Use Authorization (EUA). This EUA will remain  in effect (meaning this test can be used) for the duration of the COVID-19 declaration under Se ction 564(b)(1) of the Act, 21 U.S.C. section 360bbb-3(b)(1), unless the authorization is terminated or revoked sooner.  Performed at [facility], [address] [place], [place] [address]   SARS CORONAVIRUS 2 (TAT 6-24 HRS) Nasopharyngeal Nasopharyngeal Swab     Status: None   Collection Time: 08/03/20  5:12 PM   Specimen: Nasopharyngeal Swab  Result Value Ref Range Status   SARS Coronavirus 2 NEGATIVE NEGATIVE Final    Comment: (NOTE) SARS-CoV-2 target nucleic acids are NOT DETECTED.  The SARS-CoV-2 RNA is generally detectable in upper and lower respiratory specimens during  the acute phase of infection. Negative results do not preclude SARS-CoV-2 infection, do not rule out co-infections with other pathogens, and should not be used as the sole basis for treatment or other patient management decisions. Negative results must be combined with clinical observations, patient history, and epidemiological information. The expected result is Negative.  Fact Sheet for Patients: SugarRoll.be  Fact Sheet for Healthcare Providers: https://www.woods-mathews.com/  This test is not yet approved or cleared by the [place] FDA and  has been  authorized for detection and/or diagnosis of SARS-CoV-2 by FDA under an Emergency Use Authorization (EUA). This EUA will remain  in effect (meaning this test can be used) for the duration of the COVID-19 declaration under Se ction 564(b)(1) of the Act, 21 U.S.C. section 360bbb-3(b)(1), unless the authorization is terminated or revoked sooner.  Performed at [facility], [place] [address], [place], [place] [address]   Culture, blood (routine x 2)     Status: None (Preliminary result)   Collection Time: 08/03/20  5:58 PM   Specimen: BLOOD  Result Value Ref Range Status   Specimen Description BLOOD SITE NOT SPECIFIED  Final   Special Requests   Final    BOTTLES DRAWN AEROBIC AND ANAEROBIC Blood Culture adequate volume   Culture  Setup Time   Final    GRAM POSITIVE COCCI IN CLUSTERS IN BOTH AEROBIC AND ANAEROBIC BOTTLES CRITICAL RESULT CALLED TO, READ BACK BY AND VERIFIED WITH: Sharen Heck PharmD 12:30 08/04/20 ([email or web address]) Performed at [facility], [place] [address], [place], [place] [address]    Culture GRAM POSITIVE COCCI  Final   Report Status PENDING  Incomplete  Blood Culture ID Panel (Reflexed)     Status: Abnormal   Collection Time: 08/03/20  5:58 PM  Result Value Ref Range Status   Enterococcus faecalis NOT DETECTED NOT DETECTED Final   Enterococcus Faecium NOT DETECTED  NOT DETECTED Final   Listeria monocytogenes NOT DETECTED NOT DETECTED Final   Staphylococcus species DETECTED (A) NOT DETECTED Final    Comment: CRITICAL RESULT CALLED TO, READ BACK BY AND VERIFIED WITH: Sharen Heck PharmD 12:30 08/04/20 ([email or web address])    Staphylococcus aureus (BCID) DETECTED (A) NOT DETECTED Final    Comment: CRITICAL RESULT CALLED TO, READ BACK BY AND VERIFIED WITH: Sharen Heck PharmD 12:30 08/04/20 ([email or web address])    Staphylococcus epidermidis NOT DETECTED NOT DETECTED Final   Staphylococcus lugdunensis NOT DETECTED NOT DETECTED Final   Streptococcus species NOT DETECTED NOT DETECTED Final   Streptococcus agalactiae NOT DETECTED NOT DETECTED Final   Streptococcus pneumoniae NOT DETECTED NOT DETECTED Final   Streptococcus pyogenes NOT DETECTED NOT DETECTED Final   A.calcoaceticus-baumannii NOT DETECTED NOT DETECTED Final   Bacteroides fragilis NOT DETECTED NOT DETECTED Final   Enterobacterales NOT DETECTED NOT DETECTED Final   Enterobacter cloacae complex NOT DETECTED NOT DETECTED Final   Escherichia coli NOT DETECTED NOT DETECTED Final   Klebsiella aerogenes NOT DETECTED NOT DETECTED Final   Klebsiella oxytoca NOT DETECTED NOT DETECTED Final   Klebsiella pneumoniae NOT DETECTED NOT DETECTED Final   Proteus species NOT DETECTED NOT DETECTED Final   Salmonella species NOT DETECTED NOT DETECTED Final   Serratia marcescens NOT DETECTED NOT DETECTED Final   Haemophilus influenzae NOT DETECTED NOT DETECTED Final   Neisseria meningitidis NOT DETECTED NOT DETECTED Final   Pseudomonas aeruginosa NOT DETECTED NOT DETECTED Final   Stenotrophomonas maltophilia NOT DETECTED NOT DETECTED Final   Candida albicans NOT DETECTED NOT DETECTED Final   Candida auris NOT DETECTED NOT DETECTED Final   Candida glabrata NOT DETECTED NOT DETECTED Final   Candida krusei NOT DETECTED NOT DETECTED Final   Candida parapsilosis NOT DETECTED NOT DETECTED Final   Candida tropicalis NOT DETECTED NOT  DETECTED Final   Cryptococcus neoformans/gattii NOT DETECTED NOT DETECTED Final   Meth resistant mecA/C and MREJ NOT DETECTED NOT DETECTED Final    Comment: Performed at [facility], [address] [place], [place] [address]  Culture, blood (routine x 2)  Status: None (Preliminary result)   Collection Time: 08/03/20  6:03 PM   Specimen: BLOOD  Result Value Ref Range Status   Specimen Description BLOOD SITE NOT SPECIFIED  Final   Special Requests   Final    BOTTLES DRAWN AEROBIC AND ANAEROBIC Blood Culture adequate volume   Culture  Setup Time   Final    GRAM POSITIVE COCCI IN CLUSTERS IN BOTH AEROBIC AND ANAEROBIC BOTTLES CRITICAL VALUE NOTED.  VALUE IS CONSISTENT WITH PREVIOUSLY REPORTED AND CALLED VALUE. Performed at [facility], [place] [address] [place], [place] [address]    Culture GRAM POSITIVE COCCI  Final   Report Status PENDING  Incomplete    Studies/Results: DG CHEST PORT 1 VIEW  Result Date: 08/04/2020 CLINICAL DATA:  70 year old male with fever. Left thalamic infarct. Recent syncope, fall. EXAM: PORTABLE CHEST 1 VIEW COMPARISON:  Portable chest 07/28/2020 and earlier. FINDINGS: Portable AP semi upright view at 0437 hours. Larger lung volumes. Stable mediastinal contours with mild tortuosity of the thoracic aorta, heart size within normal limits. Visualized tracheal air column is within normal limits. Allowing for portable technique the lungs are clear. No pneumothorax. Negative visible bowel gas pattern. No acute osseous abnormality identified. IMPRESSION: No acute cardiopulmonary abnormality. Electronically Signed   By: Genevie Ann M.D.   On: 08/04/2020 06:01   Assessment/Plan:  INTERVAL HISTORY:  Abdominal aortogram today showed occlusion of the R posterior tibial artery and proximal peroneal artery. Patient received angioplasty of the R posterior tibial artery to restore blood flow. No stent placement.   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 male with PMH of ESRD (HD TTS) T2DM, CVA, HTN, HF, and PVD s/p L BKA  on dual antiplatelet therapy who presented after syncopal episode at dialysis and admitted for further work-up. Hospital course complicated by sepsis secondary to MSSA bacteremia. ID consulted for further evaluation and management.   Patient febrile overnight. Hemodynamically stable. Leukocytosis stable. Repeat blood culture no growth till less than 24 hours.   Continue IV cefazolin for now. Daily CBCs.   Patient will likely need digital amputation.   Pending echocardiogram to assess for endocarditis.  ID will continue to follow.   LOS: 7 days   Lacinda Axon 08/05/2020, 8:29 AM

## 2020-08-05 NOTE — Progress Notes (Signed)
   08/05/20 1716  Assess: MEWS Score  Temp (!) 101.6 F (38.7 C)  BP (!) 151/65  Pulse Rate 81  ECG Heart Rate 80  Resp 16  Level of Consciousness Alert  SpO2 98 %  O2 Device Room Air  Patient Activity (if Appropriate) In bed  Assess: MEWS Score  MEWS Temp 2  MEWS Systolic 0  MEWS Pulse 0  MEWS RR 0  MEWS LOC 0  MEWS Score 2  MEWS Score Color Yellow  Assess: if the MEWS score is Yellow or Red  Were vital signs taken at a resting state? Yes  Focused Assessment No change from prior assessment  Early Detection of Sepsis Score *See Row Information* Medium  MEWS guidelines implemented *See Row Information* Yes  Treat  MEWS Interventions Administered scheduled meds/treatments  Pain Scale 0-10  Pain Score 0  Take Vital Signs  Increase Vital Sign Frequency  Yellow: Q 2hr X 2 then Q 4hr X 2, if remains yellow, continue Q 4hrs  Escalate  MEWS: Escalate Yellow: discuss with charge nurse/RN and consider discussing with provider and RRT  Notify: Charge Nurse/RN  Name of Charge Nurse/RN Notified Chrissy, RN  Date Charge Nurse/RN Notified 08/05/20  Time Charge Nurse/RN Notified 1723  Document  Patient Outcome Stabilized after interventions  Progress note created (see row info) Yes

## 2020-08-05 NOTE — Progress Notes (Signed)
Physical Therapy Treatment Patient Details Name: Dennis Macias MRN: [id number] DOB: 1950/11/11 Today's Date: 08/05/2020    History of Present Illness Pt is a 70 y/o male with PMH of L BKA, s/p balloon angioplasty of occluded vessels R foot 11/21, L ICA CVA (2017), NIDDM type II with retinopathy, CKD on hemodialysis, nephrotic syndrome, combined systolic and diastolic CHF, CAD w/ NSTEMI, HTN, HLD, tubular adenmoa of colon, tachycardia, syncope, PVD, and protein malnutrition who presents after a syncopal episode at hemodialysis. His R foot wounds continue to worsen with noted dry gangrene on 1st and 2nd toes. MRI revealed acute ischemic nonhemorrhagic L thalamic infarct and multiple chronic micro hemorrhages scattered throughout both cerebral hemispheres.    PT Comments    Pt continues to be agitated and reluctant to attempt any standing bouts or OOB attempts this date, despite max encouragement and extensive pt education on importance of consistency with physical activity. Pt expected to have surgery on R foot this morning, per RN, and tried to convince pt to stand as he will likely have pain and not want to stand afterwards, but no success. Thus, focused session on addressing his neck discomfort (tightness in R lateral musculature possibly from lying rolled to R and with head tilted to R), hamstring tightness, and muscle weakness in bil LEs, see Exercises below. Will continue to follow acutely. Current recommendations remain appropriate.   Follow Up Recommendations  SNF;Supervision/Assistance - 24 hour (HHPT if pt refuses rehab placement)     Equipment Recommendations  None recommended by PT    Recommendations for Other Services       Precautions / Restrictions Precautions Precautions: Fall Precaution Comments: hx of L BKA; gangrene R foot; HOH Restrictions Weight Bearing Restrictions: No    Mobility  Bed Mobility Overal bed mobility: Needs Assistance Bed Mobility: Sit to  Supine;Rolling;Sidelying to Sit Rolling: Supervision Sidelying to sit: Mod assist   Sit to supine: Supervision   General bed mobility comments: Pt using bed rails and controls for all. Verbal and tactile cues provided to roll to R and bring knees towards chest and extend knees to manage off R EOB and cues for hand placement on R rail. ModA to manage legs and trunk to sit up.  Transfers                 General transfer comment: Pt refused  Ambulation/Gait             General Gait Details: Did not attempt, pt was not ambulating yet PTA   Stairs             Wheelchair Mobility    Modified Rankin (Stroke Patients Only) Modified Rankin (Stroke Patients Only) Pre-Morbid Rankin Score: Severe disability Modified Rankin: Severe disability     Balance Overall balance assessment: Needs assistance Sitting-balance support: No upper extremity supported;Feet supported Sitting balance-Leahy Scale: Fair Sitting balance - Comments: Static sitting EOB, no LOB, supervision for safety.   Standing balance support:  (pt declines attempts at standing)                                Cognition Arousal/Alertness: Awake/alert Behavior During Therapy: Agitated;Flat affect Overall Cognitive Status: No family/caregiver present to determine baseline cognitive functioning                                 General Comments: pt  is alert and oriented but did not know specific date in January, reluctant to participate in standing/transfers. Requires PT education on importance of mobility to prevent fatigue and decline, with poor pt awareness into deficits and impact of decreased participation.      Exercises General Exercises - Lower Extremity Long Arc Quad: Both;10 reps;Seated;Strengthening (x2 bouts) Hip ABduction/ADduction: Strengthening;Both;10 reps;Supine (hip abd and adduction against manual resistance from PT) Straight Leg Raises: Strengthening;Both;10  reps;Supine Mini-Sqauts:  (pt declined) Other Exercises Other Exercises: PROM to R upper trap, 2x~30 seconds Other Exercises: PROM to R levator scapula, 2x ~30 seconds Other Exercises: PROM to bil hamstrings in sitting EOB, 2x ~30 sec    General Comments General comments (skin integrity, edema, etc.): Extensive education on importance of consistent activity to prevent functional decline and muscle atrophy with max encouragement to try to stand this date as he is expected to have surgery on R foot this morning and will likely not want to stand afterwards secondary to pain, but pt still refused.      Pertinent Vitals/Pain Pain Assessment: Faces Faces Pain Scale: Hurts a little bit Pain Location: neck Pain Descriptors / Indicators: Discomfort;Guarding Pain Intervention(s): Monitored during session;Limited activity within patient's tolerance;Repositioned    Home Living                      Prior Function            PT Goals (current goals can now be found in the care plan section) Acute Rehab PT Goals Patient Stated Goal: to go home PT Goal Formulation: With patient Time For Goal Achievement: 08/14/20 Potential to Achieve Goals: Fair Progress towards PT goals: Not progressing toward goals - comment (pt declines attempts to progress OOB mobility)    Frequency    Min 3X/week      PT Plan Current plan remains appropriate    Co-evaluation              AM-PAC PT "6 Clicks" Mobility   Outcome Measure  Help needed turning from your back to your side while in a flat bed without using bedrails?: A Little Help needed moving from lying on your back to sitting on the side of a flat bed without using bedrails?: A Lot Help needed moving to and from a bed to a chair (including a wheelchair)?: A Lot Help needed standing up from a chair using your arms (e.g., wheelchair or bedside chair)?: A Lot Help needed to walk in hospital room?: Total Help needed climbing 3-5 steps  with a railing? : Total 6 Click Score: 11    End of Session Equipment Utilized During Treatment: Gait belt Activity Tolerance: Treatment limited secondary to agitation Patient left: with call bell/phone within reach;in bed;with bed alarm set   PT Visit Diagnosis: Unsteadiness on feet (R26.81);Muscle weakness (generalized) (M62.81);Difficulty in walking, not elsewhere classified (R26.2);Other symptoms and signs involving the nervous system (H82.993)     Time: 7169-6789 PT Time Calculation (min) (ACUTE ONLY): 21 min  Charges:  $Therapeutic Exercise: 8-22 mins                     Moishe Spice, PT, DPT Acute Rehabilitation Services  Pager: [phone] Office: [place] 08/05/2020, 9:59 AM

## 2020-08-06 DIAGNOSIS — Z9862 Peripheral vascular angioplasty status: Secondary | ICD-10-CM

## 2020-08-06 DIAGNOSIS — L97519 Non-pressure chronic ulcer of other part of right foot with unspecified severity: Secondary | ICD-10-CM

## 2020-08-06 DIAGNOSIS — L97529 Non-pressure chronic ulcer of other part of left foot with unspecified severity: Secondary | ICD-10-CM

## 2020-08-06 LAB — CULTURE, BLOOD (ROUTINE X 2)
Special Requests: ADEQUATE
Special Requests: ADEQUATE

## 2020-08-06 LAB — CBC WITH DIFFERENTIAL/PLATELET
Abs Immature Granulocytes: 0.05 10*3/uL (ref 0.00–0.07)
Basophils Absolute: 0 10*3/uL (ref 0.0–0.1)
Basophils Relative: 0 %
Eosinophils Absolute: 0 10*3/uL (ref 0.0–0.5)
Eosinophils Relative: 0 %
HCT: 22.4 % — ABNORMAL LOW (ref 39.0–52.0)
Hemoglobin: 7.8 g/dL — ABNORMAL LOW (ref 13.0–17.0)
Immature Granulocytes: 0 %
Lymphocytes Relative: 11 %
Lymphs Abs: 1.4 10*3/uL (ref 0.7–4.0)
MCH: 31 pg (ref 26.0–34.0)
MCHC: 34.8 g/dL (ref 30.0–36.0)
MCV: 88.9 fL (ref 80.0–100.0)
Monocytes Absolute: 1.1 10*3/uL — ABNORMAL HIGH (ref 0.1–1.0)
Monocytes Relative: 9 %
Neutro Abs: 10 10*3/uL — ABNORMAL HIGH (ref 1.7–7.7)
Neutrophils Relative %: 80 %
Platelets: 310 10*3/uL (ref 150–400)
RBC: 2.52 MIL/uL — ABNORMAL LOW (ref 4.22–5.81)
RDW: 14.4 % (ref 11.5–15.5)
WBC: 12.6 10*3/uL — ABNORMAL HIGH (ref 4.0–10.5)
nRBC: 0 % (ref 0.0–0.2)

## 2020-08-06 LAB — RENAL FUNCTION PANEL
Albumin: 2.2 g/dL — ABNORMAL LOW (ref 3.5–5.0)
Anion gap: 14 (ref 5–15)
BUN: 42 mg/dL — ABNORMAL HIGH (ref 8–23)
CO2: 24 mmol/L (ref 22–32)
Calcium: 8.2 mg/dL — ABNORMAL LOW (ref 8.9–10.3)
Chloride: 100 mmol/L (ref 98–111)
Creatinine, Ser: 5.45 mg/dL — ABNORMAL HIGH (ref 0.61–1.24)
GFR, Estimated: 11 mL/min — ABNORMAL LOW (ref >60)
Glucose, Bld: 128 mg/dL — ABNORMAL HIGH (ref 70–99)
Phosphorus: 5.7 mg/dL — ABNORMAL HIGH (ref 2.5–4.6)
Potassium: 4.4 mmol/L (ref 3.5–5.1)
Sodium: 138 mmol/L (ref 135–145)

## 2020-08-06 LAB — GLUCOSE, CAPILLARY
Glucose-Capillary: 167 mg/dL — ABNORMAL HIGH (ref 70–99)
Glucose-Capillary: 137 mg/dL — ABNORMAL HIGH (ref 70–99)
Glucose-Capillary: 141 mg/dL — ABNORMAL HIGH (ref 70–99)

## 2020-08-06 LAB — HEPATITIS B SURFACE ANTIGEN: Hepatitis B Surface Ag: NONREACTIVE

## 2020-08-06 LAB — HEPATITIS B CORE ANTIBODY, IGM: Hep B C IgM: NONREACTIVE

## 2020-08-06 MED ORDER — PENTAFLUOROPROP-TETRAFLUOROETH EX AERO: 1.0000 "application " | INHALATION_SPRAY | CUTANEOUS | Status: DC | PRN

## 2020-08-06 MED ORDER — DARBEPOETIN ALFA 40 MCG/0.4ML IJ SOSY
PREFILLED_SYRINGE | INTRAMUSCULAR | Status: AC
  Administered 2020-08-06: 40 ug via INTRAVENOUS
  Filled 2020-08-06: qty 0.4

## 2020-08-06 MED ORDER — LIDOCAINE HCL (PF) 1 % IJ SOLN: 5.0000 mL | INTRAMUSCULAR | Status: DC | PRN

## 2020-08-06 MED ORDER — LIDOCAINE-PRILOCAINE 2.5-2.5 % EX CREA
1.0000 "application " | TOPICAL_CREAM | CUTANEOUS | Status: DC | PRN
  Filled 2020-08-06: qty 5

## 2020-08-06 MED ORDER — POLYETHYLENE GLYCOL 3350 17 G PO PACK
17.0000 g | PACK | Freq: Every day | ORAL | Status: DC
  Administered 2020-08-06 – 2020-08-15 (×3): 17 g via ORAL
  Filled 2020-08-06 (×9): qty 1

## 2020-08-06 MED ORDER — SODIUM CHLORIDE 0.9 % IV SOLN: 100.0000 mL | INTRAVENOUS | Status: DC | PRN

## 2020-08-06 NOTE — TOC Progression Note (Signed)
Transition of Care [facility]) - Progression Note    Patient Details  Name: Dennis Macias MRN: [id number] Date of Birth: 1950-07-21  Transition of Care [facility]) CM/SW [place], [place] Phone Number: [phone] 08/06/2020, 12:10 PM  Clinical Narrative:     CSW spoke with patient via phone to confirm discharge plan. Patient explained that he was getting ready to go to HD and he was still undecided.  TOC team will continue to assist with discharge planning needs.    Barriers to Discharge: No Barriers Identified  Expected Discharge Plan and Services           Expected Discharge Date: 08/03/20                         HH Arranged: PT,OT [place]: [place] Date [facility] Contacted: 08/03/20   Representative spoke with at [place]: [place] ([place]) Interventions    Readmission Risk Interventions Readmission Risk Prevention Plan 01/19/2020 11/10/2019 08/10/2019  Transportation Screening Complete Complete Complete  PCP or Specialist Appt within 3-5 Days - - Complete  HRI or [place] - - Complete  Social Work Consult for [place] Planning/Counseling - - Complete  Palliative Care Screening - - Complete  Medication Review Press photographer) Complete Complete Complete  PCP or Specialist appointment within 3-5 days of discharge Complete - -  [place] or Home Care Consult Complete Complete -  SW Recovery Care/Counseling Consult Complete Complete -  Palliative Care Screening Complete Not Applicable -  [place] Complete Complete -  Some recent data might be hidden

## 2020-08-06 NOTE — Progress Notes (Addendum)
   ASSESSMENT & PLAN:  Dennis Macias is a 70 y.o. male s/p R PT angioplasty 08/05/20 for atherosclerosis of native arteries of RLE causing gangrene.  Continue best medical therapy for symptomatic PAD: ASA / Plavix / high intensity statin.  Will observe toes for healing. May require toe amputation in near future.  SUBJECTIVE:  No complaints.  OBJECTIVE:  BP (!) 134/3 (BP Location: Left Arm)   Pulse 80   Temp (!) 100.5 F (38.1 C) (Oral)   Resp 15   Ht 6' (1.829 m)   Wt 71.2 kg   SpO2 100%   BMI 21.29 kg/m   Intake/Output Summary (Last 24 hours) at 08/06/2020 0955 Last data filed at 08/06/2020 0501 Gross per 24 hour  Intake 640 ml  Output 425 ml  Net 215 ml    L groin soft Stable R first / second toe gangrene Foot warm and well perfused. No doppler available to assess PT.  CBC Latest Ref Rng & Units 08/06/2020 08/05/2020 08/04/2020  WBC 4.0 - 10.5 K/uL 12.6(H) 11.7(H) 11.1(H)  Hemoglobin 13.0 - 17.0 g/dL 7.8(L) 8.5(L) 8.3(L)  Hematocrit 39.0 - 52.0 % 22.4(L) 25.3(L) 24.6(L)  Platelets 150 - 400 K/uL 310 302 315     CMP Latest Ref Rng & Units 08/06/2020 08/05/2020 08/04/2020  Glucose 70 - 99 mg/dL 128(H) 130(H) 158(H)  BUN 8 - 23 mg/dL 42(H) 29(H) 24(H)  Creatinine 0.61 - 1.24 mg/dL 5.45(H) 4.19(H) 3.54(H)  Sodium 135 - 145 mmol/L 138 138 137  Potassium 3.5 - 5.1 mmol/L 4.4 3.9 4.7  Chloride 98 - 111 mmol/L 100 98 98  CO2 22 - 32 mmol/L 24 27 25   Calcium 8.9 - 10.3 mg/dL 8.2(L) 8.6(L) 8.6(L)  Total Protein 6.5 - 8.1 g/dL - - -  Total Bilirubin 0.3 - 1.2 mg/dL - - -  Alkaline Phos 38 - 126 U/L - - -  AST 15 - 41 U/L - - -  ALT 0 - 44 U/L - - -    Estimated Creatinine Clearance: 12.9 mL/min (A) (by C-G formula based on SCr of 5.45 mg/dL (H)).  [name]. Stanford Breed, MD [facility] Phone Number: [phone] 08/06/2020 9:55 AM

## 2020-08-06 NOTE — Progress Notes (Signed)
Dennis Macias   Subjective: Seen in room. No C/Os. HD today on schedule. Continues on Ancef for MSSA bacteremia. Tmax 101.6 over night.  Plans for TEE next week.  Appears more alert and interactive today.   Objective Vitals:   08/06/20 0107 08/06/20 0400 08/06/20 0525 08/06/20 0700  BP: (!) 157/68 (!) 149/67 (!) 141/65 (!) 134/3  Pulse: 73 89  80  Resp: 18 18 16 15   Temp: 100.3 F (37.9 C) (!) 100.7 F (38.2 C) (!) 100.4 F (38 C) (!) 100.5 F (38.1 C)  TempSrc: Oral Oral Oral Oral  SpO2: 100% 100%  100%  Weight:  71.2 kg    Height:       Physical Exam General: Chronically ill appearing male in NAD Heart: S1.S2 RRR  Lungs: CTAB A/P Abdomen: S,. NT Extremities: R foot with necrotic appearing 2nd toe, No LE edema Dialysis Access: R AVF + T/B    Additional Objective Labs: Basic Metabolic Panel: Recent Labs  Lab 08/04/20 2018 08/05/20 0544 08/06/20 0332  NA 137 138 138  K 4.7 3.9 4.4  CL 98 98 100  CO2 25 27 24   GLUCOSE 158* 130* 128*  BUN 24* 29* 42*  CREATININE 3.54* 4.19* 5.45*  CALCIUM 8.6* 8.6* 8.2*  PHOS 3.1 4.0 5.7*   Liver Function Tests: Recent Labs  Lab 08/04/20 0334 08/04/20 2018 08/05/20 0544 08/06/20 0332  AST 22  --   --   --   ALT 10  --   --   --   ALKPHOS 69  --   --   --   BILITOT 0.7  --   --   --   PROT 6.2*  --   --   --   ALBUMIN 2.5* 2.4* 2.3* 2.2*   No results for input(s): LIPASE, AMYLASE in the last 168 hours. CBC: Recent Labs  Lab 08/03/20 1958 08/04/20 0334 08/04/20 2018 08/05/20 0544 08/06/20 0332  WBC 15.9* 13.9* 11.1* 11.7* 12.6*  NEUTROABS 13.7*  --   --  9.8* 10.0*  HGB 9.5* 8.4* 8.3* 8.5* 7.8*  HCT 28.7* 25.0* 24.6* 25.3* 22.4*  MCV 91.7 90.6 90.4 92.0 88.9  PLT 323 319 315 302 310   Blood Culture    Component Value Date/Time   SDES BLOOD LEFT HAND 08/04/2020 1300   SDES BLOOD LEFT HAND 08/04/2020 1300   SPECREQUEST  08/04/2020 1300    BOTTLES DRAWN AEROBIC AND ANAEROBIC  Blood Culture adequate volume   SPECREQUEST  08/04/2020 1300    BOTTLES DRAWN AEROBIC AND ANAEROBIC Blood Culture adequate volume   CULT  08/04/2020 1300    NO GROWTH < 24 HOURS Performed at [facility], [place] [address] [place], [place] [address]    CULT  08/04/2020 1300    NO GROWTH < 24 HOURS Performed at [facility] [address], [place], [place] [address]    REPTSTATUS PENDING 08/04/2020 1300   REPTSTATUS PENDING 08/04/2020 1300    Cardiac Enzymes: No results for input(s): CKTOTAL, CKMB, CKMBINDEX, TROPONINI in the last 168 hours. CBG: Recent Labs  Lab 08/05/20 0632 08/05/20 1229 08/05/20 1641 08/05/20 2208 08/06/20 0843  GLUCAP 112* 128* 143* 123* 167*   Iron Studies: No results for input(s): IRON, TIBC, TRANSFERRIN, FERRITIN in the last 72 hours. @lablastinr3 @ Studies/Results: PERIPHERAL VASCULAR CATHETERIZATION  Result Date: 08/05/2020 PATIENT: Dennis Macias      MRN: [id number] DOB: 07-10-1950    DATE OF PROCEDURE: 08/05/2020 INDICATIONS:  Dennis Coulter  Macias is a 70 y.o. male with a left below the knee amputation.  He has a nonhealing wound on his right foot with gangrene of the second toe.  He is undergone previous tibial angioplasty in November of last year.  Given that the wound did not seem to be improving he was set up for arteriography and possible intervention. PROCEDURE:  1.  Conscious sedation 2.  Ultrasound-guided access to the left common femoral artery 3.  Selective catheterization of the right external iliac artery with right lower extremity runoff (second order catheterization) 4.  Selective catheterization of the posterior tibial artery with angioplasty of the right posterior tibial artery using a 2.5 x 220 balloon SURGEON: Judeth Cornfield. Scot Dock, MD, FACS ANESTHESIA: Local with sedation EBL: Minimal TECHNIQUE: The patient was brought to the peripheral vascular lab and was sedated. The period of conscious sedation was 54 minutes.  During that time period, I  was present face-to-face 100% of the time.  The patient was administered half milligram of Versed and 50 mcg of fentanyl. The patient's heart rate, blood pressure, and oxygen saturation were monitored by the nurse continuously during the procedure. Both groins were prepped and draped in the usual sterile fashion.  Under ultrasound guidance, after the skin was anesthetized, I cannulated the left common femoral artery with a micropuncture needle and a micropuncture sheath was introduced over a wire.  This was exchanged for a 5 [place] sheath over a Bentson wire.  By ultrasound the femoral artery was patent. A real-time image was obtained and sent to the server. The pigtail catheter was positioned at the L1 vertebral body and flush aortogram obtained.  I then exchanged the pigtail catheter for a crossover catheter which was positioned into the right common iliac artery.  The wire was advanced down into the external iliac artery and then the straight catheter advanced over the wire.  Right lower extremity arteriogram was obtained.  This showed that the posterior tibial artery had occluded.  The anterior tibial was patent to the foot and was occluded the peroneal artery was occluded proximally.  I elected to intervene on the posterior tibial artery. A Rosen wire was advanced down into the superficial femoral artery on the right and then the catheter was removed.  I then exchanged the short 5 French sheath for a 65 cm 6 [place] destination sheath which was positioned into the superficial femoral artery.  I then used a quick cross catheter with a V 18 wire to cross the posterior tibial artery occlusion and get down into the distal posterior tibial artery.  The quick cross catheter was advanced over the wire the wire removed and contrast was injected to be sure that I was intraluminal.  I then selected a 2.5 mm x 22 centimeter balloon.  This was passed down to the calcaneus and inflated to 14 atm for 1 minute.  I then brought  this back to overlap with the tibial peroneal trunk and this was inflated again to 14 atm for 1 minute.  Completion film showed an excellent result with a patent posterior tibial artery and runoff into the foot.  I then brought the sheath back to the external iliac artery on the left and the long destination sheath was exchanged for a short 6 [place] sheath.  The patient was transferred to the holding area for removal of the sheath.  No immediate complications were noted. FINDINGS: PRE: 100% POST: Less than 15% STENT: No Deitra Mayo, MD, FACS [facility] DATE OF DICTATION:   08/05/2020   ECHOCARDIOGRAM COMPLETE  Result Date: 08/05/2020    ECHOCARDIOGRAM REPORT   Patient Name:   TIRAN SAUSEDA Date of Exam: 08/05/2020 Medical Rec #:  [id number]  Height:       72.0 in Accession #:    [id number] Weight:       154.3 lb Date of Birth:  [birth year]  BSA:          1.908 m Patient Age:    8 years   BP:           155/54 mmHg Patient Gender: M          HR:           78 bpm. Exam Location:  Inpatient Procedure: 2D Echo, Color Doppler and Cardiac Doppler Indications:    Bacteremia  History:        Patient has prior history of Echocardiogram examinations, most                 recent 02/18/2020. CAD; Risk Factors:Hypertension, Diabetes and                 Dyslipidemia.  Sonographer:    Raquel Sarna Senior RDCS Referring Phys: [place]  1. Left ventricular ejection fraction, by estimation, is 60 to 65%. The left ventricle has normal function. The left ventricle has no regional wall motion abnormalities. Left ventricular diastolic parameters are consistent with Grade I diastolic dysfunction (impaired relaxation).  2. Right ventricular systolic function is normal. The right ventricular size is normal.  3. The mitral valve is abnormal. Trivial mitral valve regurgitation.  4. The aortic valve is tricuspid. Aortic valve regurgitation is not visualized. Mild aortic valve sclerosis  is present, with no evidence of aortic valve stenosis.  5. The inferior vena cava is normal in size with greater than 50% respiratory variability, suggesting right atrial pressure of 3 mmHg. Comparison(s): No significant change from prior study. 02/18/2020: LVEF 60-65%. Conclusion(s)/Recommendation(s): No evidence of valvular vegetations on this transthoracic echocardiogram. Would recommend a transesophageal echocardiogram to exclude infective endocarditis if clinically indicated. FINDINGS  Left Ventricle: Left ventricular ejection fraction, by estimation, is 60 to 65%. The left ventricle has normal function. The left ventricle has no regional wall motion abnormalities. The left ventricular internal cavity size was normal in size. There is  no left ventricular hypertrophy. Left ventricular diastolic parameters are consistent with Grade I diastolic dysfunction (impaired relaxation). Indeterminate filling pressures. Right Ventricle: The right ventricular size is normal. No increase in right ventricular wall thickness. Right ventricular systolic function is normal. Left Atrium: Left atrial size was normal in size. Right Atrium: Right atrial size was normal in size. Pericardium: There is no evidence of pericardial effusion. Mitral Valve: The mitral valve is abnormal. There is mild thickening of the mitral valve leaflet(s). Trivial mitral valve regurgitation. Tricuspid Valve: The tricuspid valve is grossly normal. Tricuspid valve regurgitation is trivial. Aortic Valve: The aortic valve is tricuspid. Aortic valve regurgitation is not visualized. Mild aortic valve sclerosis is present, with no evidence of aortic valve stenosis. Pulmonic Valve: The pulmonic valve was not well visualized. Pulmonic valve regurgitation is not visualized. Aorta: The aortic root and ascending aorta are structurally normal, with no evidence of dilitation. Venous: The inferior vena cava is normal in size with greater than 50% respiratory  variability, suggesting right atrial pressure of 3 mmHg. IAS/Shunts: No atrial level shunt detected by color flow Doppler.  LEFT VENTRICLE PLAX  2D LVIDd:         5.20 cm  Diastology LVIDs:         3.40 cm  LV e' medial:    5.33 cm/s LV PW:         1.00 cm  LV E/e' medial:  12.2 LV IVS:        1.00 cm  LV e' lateral:   5.98 cm/s LVOT diam:     2.50 cm  LV E/e' lateral: 10.9 LV SV:         95 LV SV Index:   50 LVOT Area:     4.91 cm  RIGHT VENTRICLE RV S prime:     15.70 cm/s TAPSE (M-mode): 2.0 cm LEFT ATRIUM             Index       RIGHT ATRIUM           Index LA diam:        2.70 cm 1.41 cm/m  RA Area:     13.00 cm LA Vol (A2C):   42.0 ml 22.01 ml/m RA Volume:   29.70 ml  15.56 ml/m LA Vol (A4C):   41.1 ml 21.54 ml/m LA Biplane Vol: 41.9 ml 21.96 ml/m  AORTIC VALVE LVOT Vmax:   107.00 cm/s LVOT Vmean:  82.700 cm/s LVOT VTI:    0.193 m  AORTA Ao Root diam: 3.70 cm MITRAL VALVE MV Area (PHT): 2.52 cm    SHUNTS MV Decel Time: 301 msec    Systemic VTI:  0.19 m MV E velocity: 65.00 cm/s  Systemic Diam: 2.50 cm MV A velocity: 90.70 cm/s MV E/A ratio:  0.72 Lyman Bishop MD Electronically signed by Lyman Bishop MD Signature Date/Time: 08/05/2020/4:40:53 PM    Final    Medications: . sodium chloride    . sodium chloride    . sodium chloride 250 mL (08/06/20 0342)  .  ceFAZolin (ANCEF) IV 2 g (08/04/20 2324)   . aspirin EC  81 mg Oral Daily  . atorvastatin  80 mg Oral QHS  . Chlorhexidine Gluconate Cloth  6 each Topical Q0600  . clopidogrel  75 mg Oral Daily  . darbepoetin (ARANESP) injection - DIALYSIS  40 mcg Intravenous Q Sat-HD  . docusate sodium  100 mg Oral Daily  . dorzolamide-timolol  1 drop Right Eye BID  . finasteride  5 mg Oral Daily  . heparin  5,000 Units Subcutaneous Q8H  . insulin aspart  0-6 Units Subcutaneous TID WC  . insulin glargine  7 Units Subcutaneous QHS  . pantoprazole  40 mg Oral Daily  . polyethylene glycol  17 g Oral Daily  . sodium chloride flush  3 mL Intravenous  Q12H     Dialysis Orders: TTS -[facility] 4hrs, BFR400, [id number], EDW 69kg,2K/2.25Ca Access:RU AVF Heparin4000 unit bolus Hectorol68mg IV qHD  Assessment/Plan: 1. Sepsis 2/2 MSSA bacteremia. ID consulted, going for TEE. Suspected source gangrenous toes.  2. Syncopal episode -likely multifactorial - acute CVA, hypotension, and acute on chronic anemia.Had another episode of bradycardia/ syncope on HDMonday. Not volume overloaded.Minimal UF with HD. 3. Acute CVA- seen on MRI. Thought to be related to cerebral hypoperfusion with hypotension post HD, as this was the cause of a past stroke. Per neuro/Admit 4. R 1st & 2nd digit ulcerations/Hx PVD: S/p RLE balloon angioplasty to PTA 05/29/20.[facility]VS consulted,wentfor arteriogram01/28/22. Mostly likely need TMA. VVS attempting limb salvage.  5. ESRD:Continue TTS schedule. HD treatments shortened due to high patient census/staffing shortage.  Not uremic or volume overloaded on exam.Planned for HD 08/06/20 6. Hypertension/volume: BP controlled, no volumeoverloadon exam.Clotted dialyzer 01/27 only -113 ml. .No post wt. UF as tolerated.  7. Acute on chronic Anemiaof CKD:+ FOBT, Hgb drop 11.3 (on 1/13 as OP) to 7.4, s/p 1 unit pRBC1/20and another unit on 1/24.GI consulted,underwent EGD1/25which showed esophagitis.Hgb now 8.5. Give ESA with HD tomorrow. . 8. Secondary Hyperparathyroidism:Corrected calcium9.9, has been running high,Phos2.2.Binders discontinued.Hectorolon hold, follow Ca. 9. Nutrition- Renal diet w/fluid restrictions.  10. DMT2   Christoper Bushey H. Bienvenido Proehl NP-C 08/06/2020, 10:59 AM  [employer] [phone]

## 2020-08-06 NOTE — Progress Notes (Signed)
Physical Therapy Note:   Pt currently at HD. Seen yesterday by Physical Therapy. Will follow-up for further tx and recs as time allows.   08/06/20 1400  PT Visit Information  Last PT Received On 08/06/20  Reason Eval/Treat Not Completed Patient at procedure or test/unavailable   Ellouise Newer, PT, DPT

## 2020-08-06 NOTE — Progress Notes (Signed)
HD#8 Subjective:   Overnight, Tmax 38.6. Vitals otherwise stable.  During evaluation this morning, patient states that he tolerated his procedure well. He does endorse some pain of the affected limb, but it is tolerable. He has no other complaints at this time.   Objective:   Vital signs in last 24 hours: Vitals:   08/05/20 2200 08/06/20 0107 08/06/20 0400 08/06/20 0525  BP: 128/60 (!) 157/68 (!) 149/67 (!) 141/65  Pulse:  73 89   Resp: 18 18 18 16   Temp: 100 F (37.8 C) 100.3 F (37.9 C) (!) 100.7 F (38.2 C) (!) 100.4 F (38 C)  TempSrc: Oral Oral Oral Oral  SpO2:  100% 100%   Weight:   71.2 kg   Height:       Physical Exam Constitutional:chronically ill-appearingmanlying bed, in no acute distress Cardiovascular: regular rate and rhythm, no m/r/g Pulmonary/Chest: normal work of breathing on room air, lungs clear to auscultation bilaterally MSK: L BKA  Skin:RLE cool and dry. Exam stable from yesterday. See Media tab for 1/27 AM picture.  Pertinent Labs: CBC Latest Ref Rng & Units 08/06/2020 08/05/2020 08/04/2020  WBC 4.0 - 10.5 K/uL 12.6(H) 11.7(H) 11.1(H)  Hemoglobin 13.0 - 17.0 g/dL 7.8(L) 8.5(L) 8.3(L)  Hematocrit 39.0 - 52.0 % 22.4(L) 25.3(L) 24.6(L)  Platelets 150 - 400 K/uL 310 302 315    CMP Latest Ref Rng & Units 08/06/2020 08/05/2020 08/04/2020  Glucose 70 - 99 mg/dL 128(H) 130(H) 158(H)  BUN 8 - 23 mg/dL 42(H) 29(H) 24(H)  Creatinine 0.61 - 1.24 mg/dL 5.45(H) 4.19(H) 3.54(H)  Sodium 135 - 145 mmol/L 138 138 137  Potassium 3.5 - 5.1 mmol/L 4.4 3.9 4.7  Chloride 98 - 111 mmol/L 100 98 98  CO2 22 - 32 mmol/L 24 27 25   Calcium 8.9 - 10.3 mg/dL 8.2(L) 8.6(L) 8.6(L)  Total Protein 6.5 - 8.1 g/dL - - -  Total Bilirubin 0.3 - 1.2 mg/dL - - -  Alkaline Phos 38 - 126 U/L - - -  AST 15 - 41 U/L - - -  ALT 0 - 44 U/L - - -    Imaging: PERIPHERAL VASCULAR CATHETERIZATION  Result Date: 08/05/2020 PATIENT: Dennis Macias      MRN: [id number] DOB: 15-Feb-1951     DATE OF PROCEDURE: 08/05/2020 INDICATIONS:  Dennis Macias is a 70 y.o. male with a left below the knee amputation.  He has a nonhealing wound on his right foot with gangrene of the second toe.  He is undergone previous tibial angioplasty in November of last year.  Given that the wound did not seem to be improving he was set up for arteriography and possible intervention. PROCEDURE:  1.  Conscious sedation 2.  Ultrasound-guided access to the left common femoral artery 3.  Selective catheterization of the right external iliac artery with right lower extremity runoff (second order catheterization) 4.  Selective catheterization of the posterior tibial artery with angioplasty of the right posterior tibial artery using a 2.5 x 220 balloon SURGEON: Judeth Cornfield. Scot Dock, MD, FACS ANESTHESIA: Local with sedation EBL: Minimal TECHNIQUE: The patient was brought to the peripheral vascular lab and was sedated. The period of conscious sedation was 54 minutes.  During that time period, I was present face-to-face 100% of the time.  The patient was administered half milligram of Versed and 50 mcg of fentanyl. The patient's heart rate, blood pressure, and oxygen saturation were monitored by the nurse continuously during the procedure. Both groins were prepped and draped  in the usual sterile fashion.  Under ultrasound guidance, after the skin was anesthetized, I cannulated the left common femoral artery with a micropuncture needle and a micropuncture sheath was introduced over a wire.  This was exchanged for a 5 [place] sheath over a Bentson wire.  By ultrasound the femoral artery was patent. A real-time image was obtained and sent to the server. The pigtail catheter was positioned at the L1 vertebral body and flush aortogram obtained.  I then exchanged the pigtail catheter for a crossover catheter which was positioned into the right common iliac artery.  The wire was advanced down into the external iliac artery and then the straight  catheter advanced over the wire.  Right lower extremity arteriogram was obtained.  This showed that the posterior tibial artery had occluded.  The anterior tibial was patent to the foot and was occluded the peroneal artery was occluded proximally.  I elected to intervene on the posterior tibial artery. A Rosen wire was advanced down into the superficial femoral artery on the right and then the catheter was removed.  I then exchanged the short 5 French sheath for a 65 cm 6 [place] destination sheath which was positioned into the superficial femoral artery.  I then used a quick cross catheter with a V 18 wire to cross the posterior tibial artery occlusion and get down into the distal posterior tibial artery.  The quick cross catheter was advanced over the wire the wire removed and contrast was injected to be sure that I was intraluminal.  I then selected a 2.5 mm x 22 centimeter balloon.  This was passed down to the calcaneus and inflated to 14 atm for 1 minute.  I then brought this back to overlap with the tibial peroneal trunk and this was inflated again to 14 atm for 1 minute.  Completion film showed an excellent result with a patent posterior tibial artery and runoff into the foot.  I then brought the sheath back to the external iliac artery on the left and the long destination sheath was exchanged for a short 6 [place] sheath.  The patient was transferred to the holding area for removal of the sheath.  No immediate complications were noted. FINDINGS: PRE: 100% POST: Less than 15% STENT: No Deitra Mayo, MD, FACS [facility] DICTATION:   08/05/2020   ECHOCARDIOGRAM COMPLETE  Result Date: 08/05/2020    ECHOCARDIOGRAM REPORT   Patient Name:   Dennis Macias Date of Exam: 08/05/2020 Medical Rec #:  [id number]  Height:       72.0 in Accession #:    [id number] Weight:       154.3 lb Date of Birth:  Mar 16, 1951  BSA:          1.908 m Patient Age:    70 years   BP:            155/54 mmHg Patient Gender: M          HR:           78 bpm. Exam Location:  Inpatient Procedure: 2D Echo, Color Doppler and Cardiac Doppler Indications:    Bacteremia  History:        Patient has prior history of Echocardiogram examinations, most                 recent 02/18/2020. CAD; Risk Factors:Hypertension, Diabetes and                 Dyslipidemia.  Sonographer:    Raquel Sarna Senior RDCS Referring Phys: [place]  1. Left ventricular ejection fraction, by estimation, is 60 to 65%. The left ventricle has normal function. The left ventricle has no regional wall motion abnormalities. Left ventricular diastolic parameters are consistent with Grade I diastolic dysfunction (impaired relaxation).  2. Right ventricular systolic function is normal. The right ventricular size is normal.  3. The mitral valve is abnormal. Trivial mitral valve regurgitation.  4. The aortic valve is tricuspid. Aortic valve regurgitation is not visualized. Mild aortic valve sclerosis is present, with no evidence of aortic valve stenosis.  5. The inferior vena cava is normal in size with greater than 50% respiratory variability, suggesting right atrial pressure of 3 mmHg. Comparison(s): No significant change from prior study. 02/18/2020: LVEF 60-65%. Conclusion(s)/Recommendation(s): No evidence of valvular vegetations on this transthoracic echocardiogram. Would recommend a transesophageal echocardiogram to exclude infective endocarditis if clinically indicated. FINDINGS  Left Ventricle: Left ventricular ejection fraction, by estimation, is 60 to 65%. The left ventricle has normal function. The left ventricle has no regional wall motion abnormalities. The left ventricular internal cavity size was normal in size. There is  no left ventricular hypertrophy. Left ventricular diastolic parameters are consistent with Grade I diastolic dysfunction (impaired relaxation). Indeterminate filling pressures. Right Ventricle: The right  ventricular size is normal. No increase in right ventricular wall thickness. Right ventricular systolic function is normal. Left Atrium: Left atrial size was normal in size. Right Atrium: Right atrial size was normal in size. Pericardium: There is no evidence of pericardial effusion. Mitral Valve: The mitral valve is abnormal. There is mild thickening of the mitral valve leaflet(s). Trivial mitral valve regurgitation. Tricuspid Valve: The tricuspid valve is grossly normal. Tricuspid valve regurgitation is trivial. Aortic Valve: The aortic valve is tricuspid. Aortic valve regurgitation is not visualized. Mild aortic valve sclerosis is present, with no evidence of aortic valve stenosis. Pulmonic Valve: The pulmonic valve was not well visualized. Pulmonic valve regurgitation is not visualized. Aorta: The aortic root and ascending aorta are structurally normal, with no evidence of dilitation. Venous: The inferior vena cava is normal in size with greater than 50% respiratory variability, suggesting right atrial pressure of 3 mmHg. IAS/Shunts: No atrial level shunt detected by color flow Doppler.  LEFT VENTRICLE PLAX 2D LVIDd:         5.20 cm  Diastology LVIDs:         3.40 cm  LV e' medial:    5.33 cm/s LV PW:         1.00 cm  LV E/e' medial:  12.2 LV IVS:        1.00 cm  LV e' lateral:   5.98 cm/s LVOT diam:     2.50 cm  LV E/e' lateral: 10.9 LV SV:         95 LV SV Index:   50 LVOT Area:     4.91 cm  RIGHT VENTRICLE RV S prime:     15.70 cm/s TAPSE (M-mode): 2.0 cm LEFT ATRIUM             Index       RIGHT ATRIUM           Index LA diam:        2.70 cm 1.41 cm/m  RA Area:     13.00 cm LA Vol (A2C):   42.0 ml 22.01 ml/m RA Volume:   29.70 ml  15.56 ml/m LA Vol (A4C):  41.1 ml 21.54 ml/m LA Biplane Vol: 41.9 ml 21.96 ml/m  AORTIC VALVE LVOT Vmax:   107.00 cm/s LVOT Vmean:  82.700 cm/s LVOT VTI:    0.193 m  AORTA Ao Root diam: 3.70 cm MITRAL VALVE MV Area (PHT): 2.52 cm    SHUNTS MV Decel Time: 301 msec     Systemic VTI:  0.19 m MV E velocity: 65.00 cm/s  Systemic Diam: 2.50 cm MV A velocity: 90.70 cm/s MV E/A ratio:  0.72 Lyman Bishop MD Electronically signed by Lyman Bishop MD Signature Date/Time: 08/05/2020/4:40:53 PM    Final     Assessment/Plan:   Principal Problem:   MSSA bacteremia Active Problems:   Diabetes mellitus with retinopathy of both eyes ([place])   Hyperlipidemia   Anemia   Chronic combined systolic and diastolic heart failure (HCC)   ESRD (end stage renal disease) ([place])   Acute cerebrovascular accident (CVA) due to ischemia (HCC)   PAD (peripheral artery disease) (HCC)   Orthostatic hypotension   S/P BKA (below knee amputation) (HCC)   Syncope   Stroke (HCC)   Dark stools   Gastroesophageal reflux disease with esophagitis   Nonerosive esophageal reflux disease   Hiatal hernia with GERD and esophagitis   Fever   Patient Summary:  Dennis Macias is a 70 y.o. man with history of ESRD on HD TTS, type 2 diabetes mellitus, history of CVA, hypertension, heart failure, and peripheral vascular disease on dual antiplatelet therapy who presented after syncopal episode at dialysis and admitted for further work-up and evaluation.   This is hospital day 8.   Sepsis secondary to MSSA bacteremia Patient with persistent fever overnight (Tmax 38.4). Vitals otherwise stable without tachycardia or hypotension. Leukocytosis (12.6). 3 out of 4 blood cultures collected 1/26 at 6 pm with staph aureus, susceptibilities pan sensitive. ID consulted. Suspected source of bacteremia is patient's ischemic toes (see below). Will require source control.  - ID consulted, appreciate recs - IV cefazolin (Day 3: 1/27)  - Repeat blood cultures collected 1/27 - TTE shows no vegetations, LVEF of 60-65% with no significant changes from last echo,. - TEE on 08/08/20 - Vascular surgery to place patient on the schedule for Monday for amputation.   R 1st and 2nd digit ulcerations History of Peripheral  vascular disease s/p L BKA S/p angioplasty of the R posterior tibial artery POD 1 (1/28) On dual antiplatelet platelet therapy due to PVD.  - Vascular surgery to place patient on the schedule for Monday for amputation.   Syncopal episode Post-HD hypotension ESRD on HD (TTS) Acute ischemic left thalamic infarct History of multiple prior CVAs (2017, 2021) HTN, HLD Patient has not had another episode of bradycardia/syncope since HD on Mon (1/24). - Continue home ASA and Plavix - Continue atorvastatin 80 mg  - Continued HD while hospitalized. Appreciate nephrology's assistance.   Acute on chronic anemia FOBT positive Nonerosive esophagitis Hgb stable at7.8.  - Continue Protonix 40 mg daily indefinitely - AM CBC  Type 2 diabetes mellitus - Lantus 7 units - SSI sensitive - Carb/renal diet if not NPO   Diet: NPO  IVF: None VTE: SCDs Code: Full PT/OT recs: SNF for Subacute PT or HHPT if patient refuses rehab placement   Please contact the on call pager after 5 pm and on weekends at [phone].  Maudie Mercury, MD PGY-1 Internal Medicine Teaching Service Pager: [phone] 08/06/2020

## 2020-08-07 LAB — RENAL FUNCTION PANEL
Albumin: 2.1 g/dL — ABNORMAL LOW (ref 3.5–5.0)
Anion gap: 13 (ref 5–15)
BUN: 23 mg/dL (ref 8–23)
CO2: 26 mmol/L (ref 22–32)
Calcium: 8.1 mg/dL — ABNORMAL LOW (ref 8.9–10.3)
Chloride: 99 mmol/L (ref 98–111)
Creatinine, Ser: 3.73 mg/dL — ABNORMAL HIGH (ref 0.61–1.24)
GFR, Estimated: 17 mL/min — ABNORMAL LOW (ref >60)
Glucose, Bld: 145 mg/dL — ABNORMAL HIGH (ref 70–99)
Phosphorus: 3 mg/dL (ref 2.5–4.6)
Potassium: 4 mmol/L (ref 3.5–5.1)
Sodium: 138 mmol/L (ref 135–145)

## 2020-08-07 LAB — CBC WITH DIFFERENTIAL/PLATELET
Abs Immature Granulocytes: 0.09 10*3/uL — ABNORMAL HIGH (ref 0.00–0.07)
Basophils Absolute: 0 10*3/uL (ref 0.0–0.1)
Basophils Relative: 0 %
Eosinophils Absolute: 0 10*3/uL (ref 0.0–0.5)
Eosinophils Relative: 0 %
HCT: 22.8 % — ABNORMAL LOW (ref 39.0–52.0)
Hemoglobin: 7.5 g/dL — ABNORMAL LOW (ref 13.0–17.0)
Immature Granulocytes: 1 %
Lymphocytes Relative: 9 %
Lymphs Abs: 1.2 10*3/uL (ref 0.7–4.0)
MCH: 29.9 pg (ref 26.0–34.0)
MCHC: 32.9 g/dL (ref 30.0–36.0)
MCV: 90.8 fL (ref 80.0–100.0)
Monocytes Absolute: 1 10*3/uL (ref 0.1–1.0)
Monocytes Relative: 8 %
Neutro Abs: 10.8 10*3/uL — ABNORMAL HIGH (ref 1.7–7.7)
Neutrophils Relative %: 82 %
Platelets: 340 10*3/uL (ref 150–400)
RBC: 2.51 MIL/uL — ABNORMAL LOW (ref 4.22–5.81)
RDW: 14.2 % (ref 11.5–15.5)
WBC: 13.1 10*3/uL — ABNORMAL HIGH (ref 4.0–10.5)
nRBC: 0 % (ref 0.0–0.2)

## 2020-08-07 LAB — GLUCOSE, CAPILLARY
Glucose-Capillary: 99 mg/dL (ref 70–99)
Glucose-Capillary: 120 mg/dL — ABNORMAL HIGH (ref 70–99)
Glucose-Capillary: 127 mg/dL — ABNORMAL HIGH (ref 70–99)
Glucose-Capillary: 133 mg/dL — ABNORMAL HIGH (ref 70–99)
Glucose-Capillary: 119 mg/dL — ABNORMAL HIGH (ref 70–99)

## 2020-08-07 LAB — SARS CORONAVIRUS 2 BY RT PCR (HOSPITAL ORDER, PERFORMED IN ~~LOC~~ HOSPITAL LAB): SARS Coronavirus 2: NEGATIVE

## 2020-08-07 MED ORDER — VANCOMYCIN HCL IN DEXTROSE 1-5 GM/200ML-% IV SOLN: 1000.0000 mg | INTRAVENOUS | Status: AC

## 2020-08-07 MED ORDER — HEPARIN SODIUM (PORCINE) 1000 UNIT/ML DIALYSIS
4000.0000 [IU] | Freq: Once | INTRAMUSCULAR | Status: DC
  Filled 2020-08-07: qty 4

## 2020-08-07 MED ORDER — SODIUM CHLORIDE 0.9 % IV SOLN: INTRAVENOUS | Status: DC

## 2020-08-07 NOTE — Progress Notes (Signed)
HD#9 Subjective:   Overnight, Tmax 38.6. Vitals otherwise stable.  During evaluation this morning, patient states that he tolerated his procedure well. He does endorse some pain of the affected limb, but it is tolerable. He has no other complaints at this time.   Objective:   Vital signs in last 24 hours: Vitals:   08/06/20 1921 08/07/20 0020 08/07/20 0401 08/07/20 1200  BP: 139/73 (!) 158/68 (!) 163/83 134/70  Pulse: 83 83 93 80  Resp: 19 17 18 20   Temp: 99.9 F (37.7 C) 100.3 F (37.9 C) 100.3 F (37.9 C) (!) 100.8 F (38.2 C)  TempSrc: Oral Oral Oral Oral  SpO2: 100% 100% 100% 99%  Weight:   69.7 kg   Height:       Physical Exam Constitutional:chronically ill-appearingmanlying bed, in no acute distress Cardiovascular: regular rate and rhythm, no m/r/g Pulmonary/Chest: normal work of breathing on room air, lungs clear to auscultation bilaterally MSK: L BKA  Skin:RLE warm, necrotic appearing 2nd, 3rd digits.   Pertinent Labs: CBC Latest Ref Rng & Units 08/07/2020 08/06/2020 08/05/2020  WBC 4.0 - 10.5 K/uL 13.1(H) 12.6(H) 11.7(H)  Hemoglobin 13.0 - 17.0 g/dL 7.5(L) 7.8(L) 8.5(L)  Hematocrit 39.0 - 52.0 % 22.8(L) 22.4(L) 25.3(L)  Platelets 150 - 400 K/uL 340 310 302    CMP Latest Ref Rng & Units 08/07/2020 08/06/2020 08/05/2020  Glucose 70 - 99 mg/dL 145(H) 128(H) 130(H)  BUN 8 - 23 mg/dL 23 42(H) 29(H)  Creatinine 0.61 - 1.24 mg/dL 3.73(H) 5.45(H) 4.19(H)  Sodium 135 - 145 mmol/L 138 138 138  Potassium 3.5 - 5.1 mmol/L 4.0 4.4 3.9  Chloride 98 - 111 mmol/L 99 100 98  CO2 22 - 32 mmol/L 26 24 27   Calcium 8.9 - 10.3 mg/dL 8.1(L) 8.2(L) 8.6(L)  Total Protein 6.5 - 8.1 g/dL - - -  Total Bilirubin 0.3 - 1.2 mg/dL - - -  Alkaline Phos 38 - 126 U/L - - -  AST 15 - 41 U/L - - -  ALT 0 - 44 U/L - - -    Imaging: No results found.  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 man with history of ESRD on HD TTS, type 2 diabetes mellitus, history of CVA, hypertension, heart failure, and peripheral vascular disease on dual antiplatelet therapy who presented after syncopal episode at dialysis and admitted for further work-up and evaluation.   This is hospital day 9.   Sepsis secondary to MSSA bacteremia Overnight fevers (Tmax 38.2). Vitals otherwise stable without tachycardia or hypotension. Leukocytosis slightly elevated from yesterday at 13.1.  - ID consulted, appreciate recs - IV cefazolin (Day 4: 1/27)   - Repeat blood cultures collected 1/27 - TTE shows no vegetations, LVEF of 60-65% with no significant changes from last echo. - TEE on 08/08/20 - TMA 08/08/20  R  1st and 2nd digit ulcerations History of Peripheral vascular disease s/p L BKA S/p angioplasty of the R posterior tibial artery POD 1 (1/28) On dual antiplatelet platelet therapy due to PVD.  - Vascular surgery to place patient on the schedule for Monday for amputation.   Syncopal episode Post-HD hypotension ESRD on HD (TTS) Acute ischemic left thalamic infarct History of multiple prior CVAs (2017, 2021) HTN, HLD Patient has not had another episode of bradycardia/syncope since HD on Mon (1/24). - Continue home ASA and Plavix - Continue atorvastatin 80 mg  - Continued HD while hospitalized. Appreciate nephrology's assistance.   Acute on chronic anemia FOBT  positive Nonerosive esophagitis Hgb stable at7.8.  - Continue Protonix 40 mg daily indefinitely - AM CBC  Type 2 diabetes mellitus - Lantus 7 units - SSI sensitive - Carb/renal diet if not NPO  Diet: NPO  IVF: None VTE: SCDs Code: Full PT/OT recs: SNF for Subacute PT or HHPT if patient refuses rehab placement   Please contact the on call pager after 5 pm and on weekends at [phone].  Maudie Mercury, MD PGY-1 Internal Medicine Teaching Service Pager: [phone] 08/07/2020

## 2020-08-07 NOTE — Progress Notes (Signed)
Physical Therapy Treatment Patient Details Name: Dennis Macias MRN: [id number] DOB: 1950-08-26 Today's Date: 08/07/2020    History of Present Illness Pt is a 70 y/o male with PMH of L BKA, s/p balloon angioplasty of occluded vessels R foot 11/21, L ICA CVA (2017), NIDDM type II with retinopathy, CKD on hemodialysis, nephrotic syndrome, combined systolic and diastolic CHF, CAD w/ NSTEMI, HTN, HLD, tubular adenmoa of colon, tachycardia, syncope, PVD, and protein malnutrition who presents after a syncopal episode at hemodialysis. His R foot wounds continue to worsen with noted dry gangrene on 1st and 2nd toes. now s/p angioplasty of the right posterior tibial artery on 1/27; MRI revealed acute ischemic nonhemorrhagic L thalamic infarct and multiple chronic micro hemorrhages scattered throughout both cerebral hemispheres; Plan for R foot possible transmet amputation 1/31    PT Comments    Continuing attempts work on functional mobility and activity tolerance;  Today's session was limited by decr participation on Mr. Knoll' part; We discussed rationale for practicing transfers, and spending time OOB ("you have to be able to get up and around in order to go home"), and initially pt agreed -- but when it was time to practice moving, pt refused stating, "I just want to stay in the bed and watch football" -- Offered to practice transfers and finish back in the bed (not optimal, but as a compromise), and he continued to refuse;   Noted plan for surgery on R foot tomorrow -- I'm hopeful that his pain will be less postop, and perhaps he will participate more; Still, at his current function, we must recommend SNF for rehab to maximize independence and safety with mobility prior to dc home;   We must push for Mr. Flammer to tolerate sitting up in the recliner for at least 4-6 hours daily -- when he discharges from here, whether he goes home or to a SNF, he will be going to [facility], and that requires him to be able to  tolerate sitting up in the HD recliner chair; Highly recommend we consider having him undergo HD in the HD recliner chair; Have placed a Geomat cushion for pressure redistribution in the room to help with sitting tolerance once he is up in the recliner;   BP 142/76 HR 82 O2 sats 100% on room air   Follow Up Recommendations  SNF;Supervision/Assistance - 24 hour (HHPT if pt refuses rehab placement)  Prosthetist Consult for prosthesis for L LE     Equipment Recommendations  Need to consider providing pt and family with resources to help with getting a ramp   Recommendations for Other Services OT consult     Precautions / Restrictions Precautions Precautions: Fall Precaution Comments: hx of L BKA; gangrene R foot; HOH    Mobility  Bed Mobility Overal bed mobility: Needs Assistance             General bed mobility comments: Ultimately, pt declined getting EOB this session; He did agree to repositioning in the bed, and participted in helping to slide himself up towards [facility] using bed rails, pushing through heel of R foot, and with light mod assist from this therapist using bed pad; Able to put bed in semi-chair position to encourage more upright positioning  Transfers                 General transfer comment: Declined transfer, wanting to stay in the bed; Offered transfer training, and then finishing back in the bed, and he declined  Ambulation/Gait  Stairs             Wheelchair Mobility    Modified Rankin (Stroke Patients Only) Modified Rankin (Stroke Patients Only) Pre-Morbid Rankin Score: Severe disability Modified Rankin: Severe disability     Balance                                            Cognition Arousal/Alertness: Awake/alert Behavior During Therapy: Agitated;Flat affect Overall Cognitive Status: No family/caregiver present to determine baseline cognitive functioning                                  General Comments: pt is alert and oriented but did not know specific date in January, reluctant to participate in standing/transfers. Requires PT education on importance of mobility to prevent fatigue and decline, with poor pt awareness into deficits and impact of decreased participation.      Exercises      General Comments General comments (skin integrity, edema, etc.): Extensive education on importance of consistent activity to prevent functional decline and muscle atrophy with max encouragement to try to be OOB or even stand; We discussed that he needs to be able to get OOB and get around to be able to go home, and he voiced understanding and agreement -- however, when it came time to work on transfers, he declines      Pertinent Vitals/Pain Pain Assessment: Faces Faces Pain Scale: Hurts little more Pain Location: R foot Pain Descriptors / Indicators: Discomfort Pain Intervention(s): Monitored during session;Repositioned;Other (comment) (let foot plate of bed out for less pressure)    Home Living                      Prior Function            PT Goals (current goals can now be found in the care plan section) Acute Rehab PT Goals Patient Stated Goal: "I just want to stay in teh bed and watch football" PT Goal Formulation: With patient Time For Goal Achievement: 08/14/20 Potential to Achieve Goals: Fair Progress towards PT goals: Not progressing toward goals - comment (limited by decr participation)    Frequency    Min 2X/week      PT Plan Current plan remains appropriate;Frequency needs to be updated    Co-evaluation              AM-PAC PT "6 Clicks" Mobility   Outcome Measure  Help needed turning from your back to your side while in a flat bed without using bedrails?: A Little Help needed moving from lying on your back to sitting on the side of a flat bed without using bedrails?: A Lot Help needed moving to and from a bed to a chair  (including a wheelchair)?: A Lot Help needed standing up from a chair using your arms (e.g., wheelchair or bedside chair)?: A Lot Help needed to walk in hospital room?: A Lot Help needed climbing 3-5 steps with a railing? : A Lot 6 Click Score: 13    End of Session   Activity Tolerance: Other (comment) (Limited by decr participation) Patient left: in bed;with call bell/phone within reach;with bed alarm set (bed in semi-chair position) Nurse Communication: Mobility status PT Visit Diagnosis: Unsteadiness on feet (R26.81);Muscle weakness (generalized) (M62.81);Difficulty in walking, not elsewhere  classified (R26.2);Other symptoms and signs involving the nervous system (R29.898)     Time: 8648-4720 PT Time Calculation (min) (ACUTE ONLY): 16 min  Charges:  $Therapeutic Activity: 8-22 mins                     Roney Marion, PT  Acute Rehabilitation Services Pager [phone] Office [place] 08/07/2020, 11:15 AM

## 2020-08-07 NOTE — Progress Notes (Signed)
   ASSESSMENT & PLAN:  Dennis Macias is a 70 y.o. male s/p R PT angioplasty 08/05/20 for atherosclerosis of native arteries of RLE causing gangrene.  Continue best medical therapy for symptomatic PAD: ASA / Plavix / high intensity statin.  Primary team concerned toes may be source of Staph bacteremia. Plan for R TMA tomorrow in OR. NPO after midnight. COVID screen.  SUBJECTIVE:  No complaints.  OBJECTIVE:  BP 134/70   Pulse 80   Temp (!) 100.8 F (38.2 C) (Oral)   Resp 20   Ht 6' (1.829 m)   Wt 69.7 kg   SpO2 99%   BMI 20.85 kg/m   Intake/Output Summary (Last 24 hours) at 08/07/2020 1217 Last data filed at 08/06/2020 2100 Gross per 24 hour  Intake --  Output 1009 ml  Net -1009 ml    L groin soft Ulceration noted to first, second, third and lateral fifth right toes Foot warm and well perfused. No doppler available to assess PT.  CBC Latest Ref Rng & Units 08/07/2020 08/06/2020 08/05/2020  WBC 4.0 - 10.5 K/uL 13.1(H) 12.6(H) 11.7(H)  Hemoglobin 13.0 - 17.0 g/dL 7.5(L) 7.8(L) 8.5(L)  Hematocrit 39.0 - 52.0 % 22.8(L) 22.4(L) 25.3(L)  Platelets 150 - 400 K/uL 340 310 302     CMP Latest Ref Rng & Units 08/07/2020 08/06/2020 08/05/2020  Glucose 70 - 99 mg/dL 145(H) 128(H) 130(H)  BUN 8 - 23 mg/dL 23 42(H) 29(H)  Creatinine 0.61 - 1.24 mg/dL 3.73(H) 5.45(H) 4.19(H)  Sodium 135 - 145 mmol/L 138 138 138  Potassium 3.5 - 5.1 mmol/L 4.0 4.4 3.9  Chloride 98 - 111 mmol/L 99 100 98  CO2 22 - 32 mmol/L 26 24 27   Calcium 8.9 - 10.3 mg/dL 8.1(L) 8.2(L) 8.6(L)  Total Protein 6.5 - 8.1 g/dL - - -  Total Bilirubin 0.3 - 1.2 mg/dL - - -  Alkaline Phos 38 - 126 U/L - - -  AST 15 - 41 U/L - - -  ALT 0 - 44 U/L - - -    Estimated Creatinine Clearance: 18.4 mL/min (A) (by C-G formula based on SCr of 3.73 mg/dL (H)).  Dennis Macias. Stanford Breed, MD [facility] Phone Number: [phone] 08/07/2020 12:17 PM

## 2020-08-07 NOTE — Progress Notes (Signed)
[facility] Progress Note   Subjective: Seen in room. No C/Os.   Objective Vitals:   08/06/20 1921 08/07/20 0020 08/07/20 0401 08/07/20 1200  BP: 139/73 (!) 158/68 (!) 163/83 134/70  Pulse: 83 83 93 80  Resp: 19 17 18 20   Temp: 99.9 F (37.7 C) 100.3 F (37.9 C) 100.3 F (37.9 C) (!) 100.8 F (38.2 C)  TempSrc: Oral Oral Oral Oral  SpO2: 100% 100% 100% 99%  Weight:   69.7 kg   Height:       Physical Exam General: Chronically ill appearing male in NAD Heart: S1.S2 RRR  Lungs: CTAB A/P Abdomen: S,. NT Extremities: R foot with necrotic appearing 2nd toe, No LE edema Dialysis Access: R AVF + T/B    Additional Objective Labs: Basic Metabolic Panel: Recent Labs  Lab 08/05/20 0544 08/06/20 0332 08/07/20 0425  NA 138 138 138  K 3.9 4.4 4.0  CL 98 100 99  CO2 27 24 26   GLUCOSE 130* 128* 145*  BUN 29* 42* 23  CREATININE 4.19* 5.45* 3.73*  CALCIUM 8.6* 8.2* 8.1*  PHOS 4.0 5.7* 3.0   Liver Function Tests: Recent Labs  Lab 08/04/20 0334 08/04/20 2018 08/05/20 0544 08/06/20 0332 08/07/20 0425  AST 22  --   --   --   --   ALT 10  --   --   --   --   ALKPHOS 69  --   --   --   --   BILITOT 0.7  --   --   --   --   PROT 6.2*  --   --   --   --   ALBUMIN 2.5*   < > 2.3* 2.2* 2.1*   < > = values in this interval not displayed.   No results for input(s): LIPASE, AMYLASE in the last 168 hours. CBC: Recent Labs  Lab 08/04/20 0334 08/04/20 2018 08/05/20 0544 08/06/20 0332 08/07/20 0425  WBC 13.9* 11.1* 11.7* 12.6* 13.1*  NEUTROABS  --   --  9.8* 10.0* 10.8*  HGB 8.4* 8.3* 8.5* 7.8* 7.5*  HCT 25.0* 24.6* 25.3* 22.4* 22.8*  MCV 90.6 90.4 92.0 88.9 90.8  PLT 319 315 302 310 340   Blood Culture    Component Value Date/Time   SDES BLOOD LEFT HAND 08/04/2020 1300   SDES BLOOD LEFT HAND 08/04/2020 1300   SPECREQUEST  08/04/2020 1300    BOTTLES DRAWN AEROBIC AND ANAEROBIC Blood Culture adequate volume   SPECREQUEST  08/04/2020 1300    BOTTLES  DRAWN AEROBIC AND ANAEROBIC Blood Culture adequate volume   CULT  08/04/2020 1300    NO GROWTH 3 DAYS Performed at [facility], [place] [address], [place], [place] [address]    CULT  08/04/2020 1300    NO GROWTH 3 DAYS Performed at [facility], [place] [address], [place], [place] [address]    REPTSTATUS PENDING 08/04/2020 1300   REPTSTATUS PENDING 08/04/2020 1300    Cardiac Enzymes: No results for input(s): CKTOTAL, CKMB, CKMBINDEX, TROPONINI in the last 168 hours. CBG: Recent Labs  Lab 08/06/20 1744 08/06/20 2203 08/07/20 0804 08/07/20 1158 08/07/20 1620  GLUCAP 99 141* 120* 127* 133*   Iron Studies: No results for input(s): IRON, TIBC, TRANSFERRIN, FERRITIN in the last 72 hours. @lablastinr3 @ Studies/Results: No results found. Medications: . sodium chloride 250 mL (08/06/20 0342)  . sodium chloride 20 mL/hr at 08/07/20 1319  .  ceFAZolin (ANCEF) IV 2 g (08/06/20 2014)  . [  START ON 08/08/2020] vancomycin     . aspirin EC  81 mg Oral Daily  . atorvastatin  80 mg Oral QHS  . Chlorhexidine Gluconate Cloth  6 each Topical Q0600  . clopidogrel  75 mg Oral Daily  . darbepoetin (ARANESP) injection - DIALYSIS  40 mcg Intravenous Q Sat-HD  . docusate sodium  100 mg Oral Daily  . dorzolamide-timolol  1 drop Right Eye BID  . finasteride  5 mg Oral Daily  . heparin  4,000 Units Dialysis Once in dialysis  . heparin  5,000 Units Subcutaneous Q8H  . insulin aspart  0-6 Units Subcutaneous TID WC  . insulin glargine  7 Units Subcutaneous QHS  . pantoprazole  40 mg Oral Daily  . polyethylene glycol  17 g Oral Daily  . sodium chloride flush  3 mL Intravenous Q12H     Dialysis Orders: TTS -[facility] 4hrs, BFR400, [id number], EDW 69kg,2K/2.25Ca Access:RU AVF Heparin4000 unit bolus Hectorol29mcg IV qHD  Assessment/Plan: 1. Sepsis 2/2 MSSA bacteremia. ID consulted, going for TEE Monday. Suspected source gangrenous toes.  2. Syncopal episode -likely  multifactorial - acute CVA, hypotension, and acute on chronic anemia. Not volume overloaded.Minimal UF with HD. 3. Acute CVA- seen on MRI. Thought to be related to cerebral hypoperfusion with hypotension post HD, as this was the cause of a past stroke. Per neuro/Admit 4. R 1st & 2nd digit ulcerations/Hx PVD: S/p RLE balloon angioplasty to PTA 05/29/20.VVS consulted,wentfor arteriogram01/28/22. Mostly likely need TMA. VVS attempting limb salvage.  5. ESRD:Continue TTS schedule. HD treatments shortened due to high patient census/staffing shortage. Not uremic or volume overloaded on exam. Is off schedule, had HD MWF last week, cont for now, HD tomorrow.  6. Hypertension/volume: BP controlled. At dry wt 7. Acute on chronic Anemiaof CKD:+ FOBT, Hgb drop 11.3 (on 1/13 as OP) to 7.4, s/p 1 unit pRBC1/20and another unit on 1/24.GI consulted,underwent EGD1/25which showed esophagitis.Hgb now 8.5. Give ESA with HD tomorrow. . 8. Secondary Hyperparathyroidism:Corrected calcium9.9, has been running high,Phos2.2.Binders discontinued.Hectorolon hold, follow Ca. 9. Nutrition- Renal diet w/fluid restrictions.  10. DMT2  Kelly Splinter, MD 08/07/2020, 7:26 PM

## 2020-08-07 NOTE — Progress Notes (Incomplete)
[facility] Progress Note   Subjective: Seen in room. Tmax 100.5 over night. Next HD 08/09/20. No issues reported over night. No C/Os.   Objective Vitals:   08/06/20 1830 08/06/20 1921 08/07/20 0020 08/07/20 0401  BP: (!) 128/58 139/73 (!) 158/68 (!) 163/83  Pulse:  83 83 93  Resp:  _0 Temp:  99.9 F (37.7 C) 100.3 F (37.9 C) 100.3 F (37.9 C)  TempSrc:  Oral Oral Oral  SpO2:  100% 100% 100%  Weight:    69.7 kg  Height:       Physical Exam General:Chronically ill appearing male in NAD Heart:S1.S2 RRR Lungs:CTAB A/P Abdomen:S,. NT Extremities:R foot with necrotic appearing 2nd toe, No LE edema Dialysis Access:R AVF + T/B   Additional Objective Labs: Basic Metabolic Panel: Recent Labs  Lab 08/05/20 0544 08/06/20 0332 08/07/20 0425  NA 138 138 138  K 3.9 4.4 4.0  CL 98 100 99  CO2 _1 GLUCOSE 130* 128* 145*  BUN 29* 42* 23  CREATININE 4.19* 5.45* 3.73*  CALCIUM 8.6* 8.2* 8.1*  PHOS 4.0 5.7* 3.0   Liver Function Tests: Recent Labs  Lab 08/04/20 0334 08/04/20 2018 08/05/20 0544 08/06/20 0332 08/07/20 0425  AST 22  --   --   --   --   ALT 10  --   --   --   --   ALKPHOS 69  --   --   --   --   BILITOT 0.7  --   --   --   --   PROT 6.2*  --   --   --   --   ALBUMIN 2.5*   < > 2.3* 2.2* 2.1*   < > = values in this interval not displayed.   No results for input(s): LIPASE, AMYLASE in the last 168 hours. CBC: Recent Labs  Lab 08/04/20 0334 08/04/20 2018 08/05/20 0544 08/06/20 0332 08/07/20 0425  WBC 13.9* 11.1* 11.7* 12.6* 13.1*  NEUTROABS  --   --  9.8* 10.0* 10.8*  HGB 8.4* 8.3* 8.5* 7.8* 7.5*  HCT 25.0* 24.6* 25.3* 22.4* 22.8*  MCV 90.6 90.4 92.0 88.9 90.8  PLT 319 315 302 310 340   Blood Culture    Component Value Date/Time   SDES BLOOD LEFT HAND 08/04/2020 1300   SDES BLOOD LEFT HAND 08/04/2020 1300   SPECREQUEST  08/04/2020 1300    BOTTLES DRAWN AEROBIC AND ANAEROBIC Blood Culture adequate volume    SPECREQUEST  08/04/2020 1300    BOTTLES DRAWN AEROBIC AND ANAEROBIC Blood Culture adequate volume   CULT  08/04/2020 1300    NO GROWTH 3 DAYS Performed at [facility], [place] [address] [place], [place] [address]    CULT  08/04/2020 1300    NO GROWTH 3 DAYS Performed at [facility], [place] [address] [place], [place] [address]    REPTSTATUS PENDING 08/04/2020 1300   REPTSTATUS PENDING 08/04/2020 1300    Cardiac Enzymes: No results for input(s): CKTOTAL, CKMB, CKMBINDEX, TROPONINI in the last 168 hours. CBG: Recent Labs  Lab 08/05/20 1641 08/05/20 2208 08/06/20 0843 08/06/20 1213 08/06/20 2203  GLUCAP 143* 123* 167* 137* 141*   Iron Studies: No results for input(s): IRON, TIBC, TRANSFERRIN, FERRITIN in the last 72 hours. _2 @ Studies/Results: PERIPHERAL VASCULAR CATHETERIZATION  Result Date: 08/05/2020 PATIENT: Dennis Macias      MRN: [id number] DOB: 1950/08/26    DATE OF PROCEDURE: 08/05/2020 INDICATIONS:  Dennis Coulter  Macias is a 70 y.o. male with a left below the knee amputation.  He has a nonhealing wound on his right foot with gangrene of the second toe.  He is undergone previous tibial angioplasty in November of last year.  Given that the wound did not seem to be improving he was set up for arteriography and possible intervention. PROCEDURE:  1.  Conscious sedation 2.  Ultrasound-guided access to the left common femoral artery 3.  Selective catheterization of the right external iliac artery with right lower extremity runoff (second order catheterization) 4.  Selective catheterization of the posterior tibial artery with angioplasty of the right posterior tibial artery using a 2.5 x 220 balloon SURGEON: Dennis Cornfield. Scot Dock, MD, FACS ANESTHESIA: Local with sedation EBL: Minimal TECHNIQUE: The patient was brought to the peripheral vascular lab and was sedated. The period of conscious sedation was 54 minutes.  During that time period, I was present face-to-face 100% of the time.   The patient was administered half milligram of Versed and 50 mcg of fentanyl. The patient's heart rate, blood pressure, and oxygen saturation were monitored by the nurse continuously during the procedure. Both groins were prepped and draped in the usual sterile fashion.  Under ultrasound guidance, after the skin was anesthetized, I cannulated the left common femoral artery with a micropuncture needle and a micropuncture sheath was introduced over a wire.  This was exchanged for a 5 [place] sheath over a Bentson wire.  By ultrasound the femoral artery was patent. A real-time image was obtained and sent to the server. The pigtail catheter was positioned at the L1 vertebral body and flush aortogram obtained.  I then exchanged the pigtail catheter for a crossover catheter which was positioned into the right common iliac artery.  The wire was advanced down into the external iliac artery and then the straight catheter advanced over the wire.  Right lower extremity arteriogram was obtained.  This showed that the posterior tibial artery had occluded.  The anterior tibial was patent to the foot and was occluded the peroneal artery was occluded proximally.  I elected to intervene on the posterior tibial artery. A Rosen wire was advanced down into the superficial femoral artery on the right and then the catheter was removed.  I then exchanged the short 5 French sheath for a 65 cm 6 [place] destination sheath which was positioned into the superficial femoral artery.  I then used a quick cross catheter with a V 18 wire to cross the posterior tibial artery occlusion and get down into the distal posterior tibial artery.  The quick cross catheter was advanced over the wire the wire removed and contrast was injected to be sure that I was intraluminal.  I then selected a 2.5 mm x 22 centimeter balloon.  This was passed down to the calcaneus and inflated to 14 atm for 1 minute.  I then brought this back to overlap with the tibial  peroneal trunk and this was inflated again to 14 atm for 1 minute.  Completion film showed an excellent result with a patent posterior tibial artery and runoff into the foot.  I then brought the sheath back to the external iliac artery on the left and the long destination sheath was exchanged for a short 6 [place] sheath.  The patient was transferred to the holding area for removal of the sheath.  No immediate complications were noted. FINDINGS: PRE: 100% POST: Less than 15% STENT: No Deitra Mayo, MD, FACS [facility] [place] DATE OF DICTATION:   08/05/2020   ECHOCARDIOGRAM COMPLETE  Result Date: 08/05/2020    ECHOCARDIOGRAM REPORT   Patient Name:   Dennis Macias Date of Exam: 08/05/2020 Medical Rec #:  [id number]  Height:       72.0 in Accession #:    [id number] Weight:       154.3 lb Date of Birth:  1950/12/15  BSA:          1.908 m Patient Age:    37 years   BP:           155/54 mmHg Patient Gender: M          HR:           78 bpm. Exam Location:  Inpatient Procedure: 2D Echo, Color Doppler and Cardiac Doppler Indications:    Bacteremia  History:        Patient has prior history of Echocardiogram examinations, most                 recent 02/18/2020. CAD; Risk Factors:Hypertension, Diabetes and                 Dyslipidemia.  Sonographer:    Raquel Sarna Senior RDCS Referring Phys: [place]  1. Left ventricular ejection fraction, by estimation, is 60 to 65%. The left ventricle has normal function. The left ventricle has no regional wall motion abnormalities. Left ventricular diastolic parameters are consistent with Grade I diastolic dysfunction (impaired relaxation).  2. Right ventricular systolic function is normal. The right ventricular size is normal.  3. The mitral valve is abnormal. Trivial mitral valve regurgitation.  4. The aortic valve is tricuspid. Aortic valve regurgitation is not visualized. Mild aortic valve sclerosis is present, with no evidence of  aortic valve stenosis.  5. The inferior vena cava is normal in size with greater than 50% respiratory variability, suggesting right atrial pressure of 3 mmHg. Comparison(s): No significant change from prior study. 02/18/2020: LVEF 60-65%. Conclusion(s)/Recommendation(s): No evidence of valvular vegetations on this transthoracic echocardiogram. Would recommend a transesophageal echocardiogram to exclude infective endocarditis if clinically indicated. FINDINGS  Left Ventricle: Left ventricular ejection fraction, by estimation, is 60 to 65%. The left ventricle has normal function. The left ventricle has no regional wall motion abnormalities. The left ventricular internal cavity size was normal in size. There is  no left ventricular hypertrophy. Left ventricular diastolic parameters are consistent with Grade I diastolic dysfunction (impaired relaxation). Indeterminate filling pressures. Right Ventricle: The right ventricular size is normal. No increase in right ventricular wall thickness. Right ventricular systolic function is normal. Left Atrium: Left atrial size was normal in size. Right Atrium: Right atrial size was normal in size. Pericardium: There is no evidence of pericardial effusion. Mitral Valve: The mitral valve is abnormal. There is mild thickening of the mitral valve leaflet(s). Trivial mitral valve regurgitation. Tricuspid Valve: The tricuspid valve is grossly normal. Tricuspid valve regurgitation is trivial. Aortic Valve: The aortic valve is tricuspid. Aortic valve regurgitation is not visualized. Mild aortic valve sclerosis is present, with no evidence of aortic valve stenosis. Pulmonic Valve: The pulmonic valve was not well visualized. Pulmonic valve regurgitation is not visualized. Aorta: The aortic root and ascending aorta are structurally normal, with no evidence of dilitation. Venous: The inferior vena cava is normal in size with greater than 50% respiratory variability, suggesting right atrial  pressure of 3 mmHg. IAS/Shunts: No atrial level shunt detected by color flow Doppler.  LEFT VENTRICLE PLAX  2D LVIDd:         5.20 cm  Diastology LVIDs:         3.40 cm  LV e' medial:    5.33 cm/s LV PW:         1.00 cm  LV E/e' medial:  12.2 LV IVS:        1.00 cm  LV e' lateral:   5.98 cm/s LVOT diam:     2.50 cm  LV E/e' lateral: 10.9 LV SV:         95 LV SV Index:   50 LVOT Area:     4.91 cm  RIGHT VENTRICLE RV S prime:     15.70 cm/s TAPSE (M-mode): 2.0 cm LEFT ATRIUM             Index       RIGHT ATRIUM           Index LA diam:        2.70 cm 1.41 cm/m  RA Area:     13.00 cm LA Vol (A2C):   42.0 ml 22.01 ml/m RA Volume:   29.70 ml  15.56 ml/m LA Vol (A4C):   41.1 ml 21.54 ml/m LA Biplane Vol: 41.9 ml 21.96 ml/m  AORTIC VALVE LVOT Vmax:   107.00 cm/s LVOT Vmean:  82.700 cm/s LVOT VTI:    0.193 m  AORTA Ao Root diam: 3.70 cm MITRAL VALVE MV Area (PHT): 2.52 cm    SHUNTS MV Decel Time: 301 msec    Systemic VTI:  0.19 m MV E velocity: 65.00 cm/s  Systemic Diam: 2.50 cm MV A velocity: 90.70 cm/s MV E/A ratio:  0.72 Lyman Bishop MD Electronically signed by Lyman Bishop MD Signature Date/Time: 08/05/2020/4:40:53 PM    Final    Medications: . sodium chloride 250 mL (08/06/20 0342)  .  ceFAZolin (ANCEF) IV 2 g (08/06/20 2014)   . aspirin EC  81 mg Oral Daily  . atorvastatin  80 mg Oral QHS  . Chlorhexidine Gluconate Cloth  6 each Topical Q0600  . clopidogrel  75 mg Oral Daily  . darbepoetin (ARANESP) injection - DIALYSIS  40 mcg Intravenous Q Sat-HD  . docusate sodium  100 mg Oral Daily  . dorzolamide-timolol  1 drop Right Eye BID  . finasteride  5 mg Oral Daily  . heparin  5,000 Units Subcutaneous Q8H  . insulin aspart  0-6 Units Subcutaneous TID WC  . insulin glargine  7 Units Subcutaneous QHS  . pantoprazole  40 mg Oral Daily  . polyethylene glycol  17 g Oral Daily  . sodium chloride flush  3 mL Intravenous Q12H     Dialysis Orders: TTS -[facility] 4hrs, BFR400, [id number], EDW  69kg,2K/2.25Ca Access:RU AVF Heparin4000 unit bolus Hectorol19mg IV qHD  Assessment/Plan: 1. Sepsis 2/2 MSSA bacteremia. ID consulted, going for TEE. Suspected source gangrenous toes. ABX per primary.  2. Syncopal episode -likely multifactorial - acute CVA, hypotension, and acute on chronic anemia.Had another episode of bradycardia/ syncope on HDMonday. Not volume overloaded.Minimal UF with HD. 3. Acute CVA- seen on MRI. Thought to be related to cerebral hypoperfusion with hypotension post HD, as this was the cause of a past stroke. Per neuro/Admit 4. R 1st & 2nd digit ulcerations/Hx PVD: S/p RLE balloon angioplasty to PTA 05/29/20.VVS consulted,wentfor arteriogram01/28/22. Mostly likely need TMA. VVS attempting limb salvage.  5. ESRD:Continue TTS schedule. HD treatments shortened due to high patient census/staffing shortage. Last HD 08/06/20 Next HD 08/09/20.  6. Hypertension/volume:  BP controlled, no volumeoverloadon exam.HD 01/29 Net UF 1009 ML post wt 66.1 kg. Under OP EDW. Will need lowered appropriately. No LE edema. No SOB. UF as tolerated.  7. Acute on chronic Anemiaof CKD:+ FOBT, Hgb drop 11.3 (on 1/13 as OP) to 7.4, s/p 1 unit pRBC1/20and another unit on 1/24.GI consulted,underwent EGD1/25which showed esophagitis.Hgb now 7.5. Aranesp 40 mcg IV 01/29 but dose may be insufficient. Follow HGB, transfuse as needed.  8. Secondary Hyperparathyroidism:C Ca 9.6 resume hectorol at lower dose. PO4 3.0. Continue to hold binders.  9. Nutrition- Renal diet w/fluid restrictions.  10. DMT2  Rita H. Brown NP-C 08/07/2020, 8:58 AM  [employer] [phone]

## 2020-08-07 NOTE — H&P (View-Only) (Signed)
HD#9 Subjective:   Overnight, Tmax 38.6. Vitals otherwise stable.  During evaluation this morning, patient states that he tolerated his procedure well. He does endorse some pain of the affected limb, but it is tolerable. He has no other complaints at this time.   Objective:   Vital signs in last 24 hours: Vitals:   08/06/20 1921 08/07/20 0020 08/07/20 0401 08/07/20 1200  BP: 139/73 (!) 158/68 (!) 163/83 134/70  Pulse: 83 83 93 80  Resp: 19 17 18 20   Temp: 99.9 F (37.7 C) 100.3 F (37.9 C) 100.3 F (37.9 C) (!) 100.8 F (38.2 C)  TempSrc: Oral Oral Oral Oral  SpO2: 100% 100% 100% 99%  Weight:   69.7 kg   Height:       Physical Exam Constitutional:chronically ill-appearingmanlying bed, in no acute distress Cardiovascular: regular rate and rhythm, no m/r/g Pulmonary/Chest: normal work of breathing on room air, lungs clear to auscultation bilaterally MSK: L BKA  Skin:RLE warm, necrotic appearing 2nd, 3rd digits.   Pertinent Labs: CBC Latest Ref Rng & Units 08/07/2020 08/06/2020 08/05/2020  WBC 4.0 - 10.5 K/uL 13.1(H) 12.6(H) 11.7(H)  Hemoglobin 13.0 - 17.0 g/dL 7.5(L) 7.8(L) 8.5(L)  Hematocrit 39.0 - 52.0 % 22.8(L) 22.4(L) 25.3(L)  Platelets 150 - 400 K/uL 340 310 302    CMP Latest Ref Rng & Units 08/07/2020 08/06/2020 08/05/2020  Glucose 70 - 99 mg/dL 145(H) 128(H) 130(H)  BUN 8 - 23 mg/dL 23 42(H) 29(H)  Creatinine 0.61 - 1.24 mg/dL 3.73(H) 5.45(H) 4.19(H)  Sodium 135 - 145 mmol/L 138 138 138  Potassium 3.5 - 5.1 mmol/L 4.0 4.4 3.9  Chloride 98 - 111 mmol/L 99 100 98  CO2 22 - 32 mmol/L 26 24 27   Calcium 8.9 - 10.3 mg/dL 8.1(L) 8.2(L) 8.6(L)  Total Protein 6.5 - 8.1 g/dL - - -  Total Bilirubin 0.3 - 1.2 mg/dL - - -  Alkaline Phos 38 - 126 U/L - - -  AST 15 - 41 U/L - - -  ALT 0 - 44 U/L - - -    Imaging: No results found.  Assessment/Plan:   Principal Problem:   MSSA bacteremia Active Problems:   Diabetes mellitus with retinopathy of both eyes  (HCC)   Hyperlipidemia   Anemia   Chronic combined systolic and diastolic heart failure (HCC)   ESRD (end stage renal disease) ([place])   Acute cerebrovascular accident (CVA) due to ischemia (HCC)   PAD (peripheral artery disease) (HCC)   Orthostatic hypotension   S/P BKA (below knee amputation) ([place])   Syncope   Stroke ([place])   Dark stools   Gastroesophageal reflux disease with esophagitis   Nonerosive esophageal reflux disease   Hiatal hernia with GERD and esophagitis   Fever   Patient Summary:  Dennis Macias is a 70 y.o. man with history of ESRD on HD TTS, type 2 diabetes mellitus, history of CVA, hypertension, heart failure, and peripheral vascular disease on dual antiplatelet therapy who presented after syncopal episode at dialysis and admitted for further work-up and evaluation.   This is hospital day 9.   Sepsis secondary to MSSA bacteremia Overnight fevers (Tmax 38.2). Vitals otherwise stable without tachycardia or hypotension. Leukocytosis slightly elevated from yesterday at 13.1.  - ID consulted, appreciate recs - IV cefazolin (Day 4: 1/27)   - Repeat blood cultures collected 1/27 - TTE shows no vegetations, LVEF of 60-65% with no significant changes from last echo. - TEE on 08/08/20 - TMA 08/08/20  R  1st and 2nd digit ulcerations History of Peripheral vascular disease s/p L BKA S/p angioplasty of the R posterior tibial artery POD 1 (1/28) On dual antiplatelet platelet therapy due to PVD.  - Vascular surgery to place patient on the schedule for Monday for amputation.   Syncopal episode Post-HD hypotension ESRD on HD (TTS) Acute ischemic left thalamic infarct History of multiple prior CVAs (2017, 2021) HTN, HLD Patient has not had another episode of bradycardia/syncope since HD on Mon (1/24). - Continue home ASA and Plavix - Continue atorvastatin 80 mg  - Continued HD while hospitalized. Appreciate nephrology's assistance.   Acute on chronic anemia FOBT  positive Nonerosive esophagitis Hgb stable at7.8.  - Continue Protonix 40 mg daily indefinitely - AM CBC  Type 2 diabetes mellitus - Lantus 7 units - SSI sensitive - Carb/renal diet if not NPO  Diet: NPO  IVF: None VTE: SCDs Code: Full PT/OT recs: SNF for Subacute PT or HHPT if patient refuses rehab placement   Please contact the on call pager after 5 pm and on weekends at [phone].  Maudie Mercury, MD PGY-1 Internal Medicine Teaching Service Pager: [phone] 08/07/2020

## 2020-08-08 ENCOUNTER — Encounter (HOSPITAL_COMMUNITY): Payer: Self-pay | Admitting: Certified Registered Nurse Anesthetist

## 2020-08-08 ENCOUNTER — Encounter: Payer: Medicare PPO | Admitting: Surgery

## 2020-08-08 ENCOUNTER — Encounter (HOSPITAL_COMMUNITY): Payer: Medicare PPO

## 2020-08-08 ENCOUNTER — Encounter (HOSPITAL_COMMUNITY)
Admission: EM | Disposition: A | Discharge status: Skilled Nursing Facility | Payer: Self-pay | Source: Home / Self Care | Attending: Internal Medicine

## 2020-08-08 ENCOUNTER — Encounter (HOSPITAL_COMMUNITY): Payer: Self-pay | Admitting: Internal Medicine

## 2020-08-08 ENCOUNTER — Ambulatory Visit: Payer: Medicare PPO

## 2020-08-08 ENCOUNTER — Inpatient Hospital Stay (HOSPITAL_COMMUNITY): Payer: Medicare PPO

## 2020-08-08 ENCOUNTER — Inpatient Hospital Stay (HOSPITAL_COMMUNITY): Payer: Medicare PPO | Admitting: Certified Registered Nurse Anesthetist

## 2020-08-08 DIAGNOSIS — E1122 Type 2 diabetes mellitus with diabetic chronic kidney disease: Secondary | ICD-10-CM | POA: Diagnosis not present

## 2020-08-08 DIAGNOSIS — Z992 Dependence on renal dialysis: Secondary | ICD-10-CM | POA: Diagnosis not present

## 2020-08-08 DIAGNOSIS — I38 Endocarditis, valve unspecified: Secondary | ICD-10-CM | POA: Diagnosis not present

## 2020-08-08 DIAGNOSIS — I10 Essential (primary) hypertension: Secondary | ICD-10-CM

## 2020-08-08 DIAGNOSIS — N186 End stage renal disease: Secondary | ICD-10-CM | POA: Diagnosis not present

## 2020-08-08 HISTORY — PX: TEE WITHOUT CARDIOVERSION: SHX5443

## 2020-08-08 HISTORY — PX: BUBBLE STUDY: SHX6837

## 2020-08-08 LAB — GLUCOSE, CAPILLARY
Glucose-Capillary: 112 mg/dL — ABNORMAL HIGH (ref 70–99)
Glucose-Capillary: 99 mg/dL (ref 70–99)
Glucose-Capillary: 121 mg/dL — ABNORMAL HIGH (ref 70–99)
Glucose-Capillary: 103 mg/dL — ABNORMAL HIGH (ref 70–99)

## 2020-08-08 LAB — PROTIME-INR
INR: 2.6 — ABNORMAL HIGH (ref 0.8–1.2)
Prothrombin Time: 27.2 seconds — ABNORMAL HIGH (ref 11.4–15.2)

## 2020-08-08 LAB — CBC
HCT: 24 % — ABNORMAL LOW (ref 39.0–52.0)
Hemoglobin: 7.5 g/dL — ABNORMAL LOW (ref 13.0–17.0)
MCH: 29.6 pg (ref 26.0–34.0)
MCHC: 31.3 g/dL (ref 30.0–36.0)
MCV: 94.9 fL (ref 80.0–100.0)
Platelets: 383 10*3/uL (ref 150–400)
RBC: 2.53 MIL/uL — ABNORMAL LOW (ref 4.22–5.81)
RDW: 14.1 % (ref 11.5–15.5)
WBC: 14.2 10*3/uL — ABNORMAL HIGH (ref 4.0–10.5)
nRBC: 0 % (ref 0.0–0.2)

## 2020-08-08 LAB — BASIC METABOLIC PANEL
Anion gap: 15 (ref 5–15)
BUN: 33 mg/dL — ABNORMAL HIGH (ref 8–23)
CO2: 24 mmol/L (ref 22–32)
Calcium: 8.5 mg/dL — ABNORMAL LOW (ref 8.9–10.3)
Chloride: 100 mmol/L (ref 98–111)
Creatinine, Ser: 4.78 mg/dL — ABNORMAL HIGH (ref 0.61–1.24)
GFR, Estimated: 12 mL/min — ABNORMAL LOW (ref >60)
Glucose, Bld: 124 mg/dL — ABNORMAL HIGH (ref 70–99)
Potassium: 4.3 mmol/L (ref 3.5–5.1)
Sodium: 139 mmol/L (ref 135–145)

## 2020-08-08 LAB — HEPATITIS B E ANTIBODY: Hep B E Ab: NEGATIVE

## 2020-08-08 SURGERY — ECHOCARDIOGRAM, TRANSESOPHAGEAL
Anesthesia: Monitor Anesthesia Care

## 2020-08-08 MED ORDER — CEFAZOLIN SODIUM-DEXTROSE 1-4 GM/50ML-% IV SOLN
1.0000 g | INTRAVENOUS | Status: AC
  Administered 2020-08-08 – 2020-08-18 (×10): 1 g via INTRAVENOUS
  Filled 2020-08-08 (×11): qty 50

## 2020-08-08 MED ORDER — PROPOFOL 500 MG/50ML IV EMUL
INTRAVENOUS | Status: DC | PRN
  Administered 2020-08-08: 125 ug/kg/min via INTRAVENOUS

## 2020-08-08 MED ORDER — PROPOFOL 10 MG/ML IV BOLUS
INTRAVENOUS | Status: DC | PRN
  Administered 2020-08-08 (×3): 10 mg via INTRAVENOUS

## 2020-08-08 MED ORDER — LIDOCAINE 2% (20 MG/ML) 5 ML SYRINGE
INTRAMUSCULAR | Status: DC | PRN
  Administered 2020-08-08: 80 mg via INTRAVENOUS

## 2020-08-08 MED ORDER — SODIUM CHLORIDE 0.9 % IV SOLN: INTRAVENOUS | Status: DC | PRN

## 2020-08-08 NOTE — Transfer of Care (Signed)
Immediate Anesthesia Transfer of Care Note  Patient: Dennis Macias  Procedure(s) Performed: TRANSESOPHAGEAL ECHOCARDIOGRAM (TEE) (N/A ) BUBBLE STUDY  Patient Location: Endoscopy Unit  Anesthesia Type:MAC  Level of Consciousness: awake and oriented  Airway & Oxygen Therapy: Patient Spontanous Breathing and Patient connected to nasal cannula oxygen  Post-op Assessment: Report given to RN and Post -op Vital signs reviewed and stable  Post vital signs: Reviewed and stable  Last Vitals:  Vitals Value Taken Time  BP    Temp    Pulse 75 08/08/20 1447  Resp 21 08/08/20 1447  SpO2 100 % 08/08/20 1447  Vitals shown include unvalidated device data.  Last Pain:  Vitals:   08/08/20 1234  TempSrc: Temporal  PainSc: 0-No pain      Patients Stated Pain Goal: 0 (75/88/32 5498)  Complications: No complications documented.

## 2020-08-08 NOTE — Progress Notes (Signed)
Echocardiogram Echocardiogram Transesophageal has been performed.  Dennis Macias 08/08/2020, 3:19 PM

## 2020-08-08 NOTE — Interval H&P Note (Signed)
History and Physical Interval Note:  08/08/2020 1:07 PM  Dennis Macias  has presented today for surgery, with the diagnosis of BACTERIMA.  The various methods of treatment have been discussed with the patient and family. After consideration of risks, benefits and other options for treatment, the patient has consented to  Procedure(s): TRANSESOPHAGEAL ECHOCARDIOGRAM (TEE) (N/A) as a surgical intervention.  The patient's history has been reviewed, patient examined, no change in status, stable for surgery.  I have reviewed the patient's chart and labs.  Questions were answered to the patient's satisfaction.     Elouise Munroe

## 2020-08-08 NOTE — Progress Notes (Addendum)
[facility] Progress Note   Subjective:   Reports he is feeling better today. No SOB, CP, palpitations, dizziness, abdominal pain or nausea. Still c/o foot pain, planned for R TMA today.   Objective Vitals:   08/07/20 1200 08/07/20 1929 08/08/20 0029 08/08/20 0419  BP: 134/70 (!) 155/76 (!) 150/69 (!) 145/69  Pulse: 80 73 74 73  Resp: 20 18 16 17   Temp: (!) 100.8 F (38.2 C) 98.9 F (37.2 C) 98.9 F (37.2 C) 99.9 F (37.7 C)  TempSrc: Oral Oral Oral Oral  SpO2: 99% 99% 99% 100%  Weight:    68.2 kg  Height:       Physical Exam General: WDWN male, alert and in NAD Heart: RRR, no murmurs, rubs or gallops Lungs: CTA bilaterally without wheezing, rhonchi or rales Abdomen: Soft, non-tender, non-distended, +BS Extremities: R foot with necrotic appearing 2nd toe. No LE edema. Dialysis Access: RUE AVF  +bruit  Additional Objective Labs: Basic Metabolic Panel: Recent Labs  Lab 08/05/20 0544 08/06/20 0332 08/07/20 0425 08/08/20 0240  NA 138 138 138 139  K 3.9 4.4 4.0 4.3  CL 98 100 99 100  CO2 27 24 26 24   GLUCOSE 130* 128* 145* 124*  BUN 29* 42* 23 33*  CREATININE 4.19* 5.45* 3.73* 4.78*  CALCIUM 8.6* 8.2* 8.1* 8.5*  PHOS 4.0 5.7* 3.0  --    Liver Function Tests: Recent Labs  Lab 08/04/20 0334 08/04/20 2018 08/05/20 0544 08/06/20 0332 08/07/20 0425  AST 22  --   --   --   --   ALT 10  --   --   --   --   ALKPHOS 69  --   --   --   --   BILITOT 0.7  --   --   --   --   PROT 6.2*  --   --   --   --   ALBUMIN 2.5*   < > 2.3* 2.2* 2.1*   < > = values in this interval not displayed.   CBC: Recent Labs  Lab 08/04/20 2018 08/05/20 0544 08/06/20 0332 08/07/20 0425 08/08/20 0240  WBC 11.1* 11.7* 12.6* 13.1* 14.2*  NEUTROABS  --  9.8* 10.0* 10.8*  --   HGB 8.3* 8.5* 7.8* 7.5* 7.5*  HCT 24.6* 25.3* 22.4* 22.8* 24.0*  MCV 90.4 92.0 88.9 90.8 94.9  PLT 315 302 310 340 383   Blood Culture    Component Value Date/Time   SDES BLOOD LEFT HAND  08/04/2020 1300   SDES BLOOD LEFT HAND 08/04/2020 1300   SPECREQUEST  08/04/2020 1300    BOTTLES DRAWN AEROBIC AND ANAEROBIC Blood Culture adequate volume   SPECREQUEST  08/04/2020 1300    BOTTLES DRAWN AEROBIC AND ANAEROBIC Blood Culture adequate volume   CULT  08/04/2020 1300    NO GROWTH 3 DAYS Performed at [facility], [place] [address], [place], [place] [address]    CULT  08/04/2020 1300    NO GROWTH 3 DAYS Performed at [facility], [place] [address] [place],  [address]    REPTSTATUS PENDING 08/04/2020 1300   REPTSTATUS PENDING 08/04/2020 1300   CBG: Recent Labs  Lab 08/07/20 0804 08/07/20 1158 08/07/20 1620 08/07/20 2124 08/08/20 0813  GLUCAP 120* 127* 133* 119* 112*   Medications: . sodium chloride 250 mL (08/06/20 0342)  . sodium chloride 20 mL/hr at 08/08/20 0600  .  ceFAZolin (ANCEF) IV 2 g (08/06/20 2014)  . vancomycin     .  aspirin EC  81 mg Oral Daily  . atorvastatin  80 mg Oral QHS  . Chlorhexidine Gluconate Cloth  6 each Topical Q0600  . clopidogrel  75 mg Oral Daily  . darbepoetin (ARANESP) injection - DIALYSIS  40 mcg Intravenous Q Sat-HD  . docusate sodium  100 mg Oral Daily  . dorzolamide-timolol  1 drop Right Eye BID  . finasteride  5 mg Oral Daily  . heparin  4,000 Units Dialysis Once in dialysis  . heparin  5,000 Units Subcutaneous Q8H  . insulin aspart  0-6 Units Subcutaneous TID WC  . insulin glargine  7 Units Subcutaneous QHS  . pantoprazole  40 mg Oral Daily  . polyethylene glycol  17 g Oral Daily  . sodium chloride flush  3 mL Intravenous Q12H    Dialysis Orders: TTS -[facility] 4hrs, BFR400, [id number], EDW 69kg,2K/2.25Ca Access:RU AVF Heparin4000 unit bolus Hectorol12mcg IV qHD  Assessment/Plan: 1. Sepsis 2/2 MSSA bacteremia. ID consulted, going for TEE and TMA today. Suspected source gangrenous toes.  2. Syncopal episode -likely multifactorial - acute CVA, hypotension, and acute on chronic anemia. Not  volume overloaded.Minimal UF with HD. 3. Acute CVA- seen on MRI. Thought to be related to cerebral hypoperfusion with hypotension post HD, as this was the cause of Macias past stroke. Per neuro/Admit 4. R 1st & 2nd digit ulcerations/Hx PVD: S/p RLE balloon angioplasty to PTA 05/29/20.VVS consulted,wentfor arteriogram01/28/22. Planned for TMA today.  5. ESRD:Continue TTS schedule. HD treatments shortened due to high patient census/staffing shortage. Not uremic or volume overloaded on exam. Is off schedule, had HD MWF last week. HD today if schedule allows, otherwise HD tomorrow morning.  6. Hypertension/volume: BP mostly controlled. Below dry wt with no volume overload on exam. Likely losing some weight with NPO status.  7. Acute on chronic Anemiaof CKD:+ FOBT, Hgb drop 11.3 (on 1/13 as OP) to 7.4, s/p 1 unit pRBC1/20and another unit on 1/24.GI consulted,underwent EGD1/25which showed esophagitis.Hgb now 7.5. ESA ordered with HD today.  8. Secondary Hyperparathyroidism:Corrected calciumcontrolled, has been running high,Phoswas low andbinders discontinued, now at goal. Hectorolon hold, follow Ca. 9. Nutrition- Renal diet w/fluid restrictions.  10. DMT2   Dennis Paganini, PA-C 08/08/2020, 9:14 AM  [facility] Pager: [phone]  I have seen and examined this patient and agree with plan and assessment in the above note with renal recommendations/intervention highlighted.  Still complaining of right foot pain and for TMA tomorrow.  For TEE today.  HD either today or tomorrow depending upon scheduling.  Dennis Macias Dennis Dejonge,MD 08/08/2020 11:44 AM

## 2020-08-08 NOTE — Progress Notes (Signed)
Occupational Therapy Treatment Patient Details Name: Dennis Macias MRN: [id number] DOB: 18-Jul-1950 Today's Date: 08/08/2020    History of present illness Pt is a 70 y/o male with PMH of L BKA, s/p balloon angioplasty of occluded vessels R foot 11/21, L ICA CVA (2017), NIDDM type II with retinopathy, CKD on hemodialysis, nephrotic syndrome, combined systolic and diastolic CHF, CAD w/ NSTEMI, HTN, HLD, tubular adenmoa of colon, tachycardia, syncope, PVD, and protein malnutrition who presents after a syncopal episode at hemodialysis. His R foot wounds continue to worsen with noted dry gangrene on 1st and 2nd toes. now s/p angioplasty of the right posterior tibial artery on 1/27; MRI revealed acute ischemic nonhemorrhagic L thalamic infarct and multiple chronic micro hemorrhages scattered throughout both cerebral hemispheres; Plan for R foot possible transmet amputation 1/31   OT comments  Pt making very little if any progress at this point.  Pt is very resistant to mobility, EOB work or [place] work.  Spoke with pt at length about the need to do these things to stay strong to be able to d/c home.  Pt states his girlfriend transfers him at home but girlfriend does work during the day.  Pt will not be able to safely d/c home at this level.  Hoping after RLE surgery today, pt may be more willing to work with therapy.  Will continue with attempts at mobilization at this point.    Follow Up Recommendations  SNF;Supervision/Assistance - 24 hour;Home health OT    Equipment Recommendations  None recommended by OT    Recommendations for Other Services      Precautions / Restrictions Precautions Precautions: Fall Precaution Comments: hx of L BKA; gangrene R foot; HOH Restrictions Weight Bearing Restrictions: No       Mobility Bed Mobility Overal bed mobility: Needs Assistance Bed Mobility: Sit to Supine;Rolling;Sidelying to Sit Rolling: Supervision Sidelying to sit: Mod assist   Sit to supine:  Supervision   General bed mobility comments: Pt agreed to EOB only today and then would work there only about a minute before returning himself to supine.  Talked w pt aboutt the need to mobilize and get OOB if he wants to dc home. Pt very resistant.  Transfers                 General transfer comment: Declined transfer, wanting to stay in the bed; Offered transfer training, and then finishing back in the bed, and he declined    Balance Overall balance assessment: Needs assistance Sitting-balance support: Bilateral upper extremity supported;Feet supported Sitting balance-Leahy Scale: Fair Sitting balance - Comments: if challeges given, pt with fair sitting balance.  Static sitting for short times with supervision.       Standing balance comment: PT declined.                           ADL either performed or assessed with clinical judgement   ADL Overall ADL's : Needs assistance/impaired Eating/Feeding: Independent   Grooming: Wash/dry hands;Wash/dry face;Brushing hair;Set up;Bed level Grooming Details (indicate cue type and reason): pt refused task at [place] and Hygiene: Total assistance;Bed level Toileting - Clothing Manipulation Details (indicate cue type and reason): pt rolled side to side with supervision while therapist cleaned pt and changed sheets.       General ADL Comments: Pt declined all  transfers. Agreed to sit EOB and mobilize but changted mind once sitting. Pt moved very quickly up to EOB and unsafely and then laid back down after appx 1 minute.     Vision   Vision Assessment?: Yes Eye Alignment: Within Functional Limits Ocular Range of Motion: Within Functional Limits Alignment/Gaze Preference: Within Defined Limits Tracking/Visual Pursuits: Able to track stimulus in all quads without difficulty Visual Fields: No apparent deficits   Perception     Praxis      Cognition  Arousal/Alertness: Awake/alert Behavior During Therapy: Agitated;Flat affect Overall Cognitive Status: No family/caregiver present to determine baseline cognitive functioning                                 General Comments: Pt oriented to month but said it was 2020.  Had long discussion with pt about mobilizing. Pt totally resistant this session to moving, sitting on side of bed or transferring to chair. Discussed with him that this is the only way he will be able to discharge home.  He finally agreed to mobilizing,but once sitting on EOB for 1 minute, he laid back down and said for therapist to leave him alone.  Hoping he will do more after surgery.        Exercises     Shoulder Instructions       General Comments Pt limited by lack of participation.  Pt laying in soaking wet sheets and linens. Rolled side to side to change bedsheets and gown.    Pertinent Vitals/ Pain       Pain Assessment: Faces Faces Pain Scale: Hurts little more Pain Location: R foot Pain Descriptors / Indicators: Discomfort Pain Intervention(s): Limited activity within patient's tolerance;Monitored during session;Repositioned  Home Living                                          Prior Functioning/Environment              Frequency  Min 2X/week        Progress Toward Goals  OT Goals(current goals can now be found in the care plan section)  Progress towards OT goals: Not progressing toward goals - comment (Pt resistant to mobiltiy.)  Acute Rehab OT Goals Patient Stated Goal: "For you to all leave me alone." Time For Goal Achievement: 08/14/20 Potential to Achieve Goals: Fair ADL Goals Pt Will Perform Grooming: with set-up;sitting Pt Will Perform Upper Body Bathing: with set-up;sitting Pt Will Perform Lower Body Bathing: with mod assist;sit to/from stand;sitting/lateral leans Pt Will Perform Upper Body Dressing: with set-up;sitting;with supervision Pt Will  Transfer to Toilet: with mod assist;squat pivot transfer;bedside commode Pt Will Perform Toileting - Clothing Manipulation and hygiene: with max assist;sit to/from stand  Plan Discharge plan remains appropriate    Co-evaluation                 AM-PAC OT "6 Clicks" Daily Activity     Outcome Measure   Help from another person eating meals?: None Help from another person taking care of personal grooming?: A Little Help from another person toileting, which includes using toliet, bedpan, or urinal?: A Lot Help from another person bathing (including washing, rinsing, drying)?: A Little Help from another person to put on and taking off regular upper body clothing?: A Little Help from another person to  put on and taking off regular lower body clothing?: A Lot 6 Click Score: 17    End of Session    OT Visit Diagnosis: Unsteadiness on feet (R26.81)   Activity Tolerance Patient limited by fatigue   Patient Left in bed;with call bell/phone within reach;with bed alarm set   Nurse Communication Mobility status        Time: 7026-3785 OT Time Calculation (min): 24 min  Charges: OT General Charges $OT Visit: 1 Visit OT Treatments $Self Care/Home Management : 23-37 mins   [place], [place] 08/08/2020, 10:05 AM

## 2020-08-08 NOTE — Progress Notes (Signed)
Plans for TEE today will overlap with TMA. Will reschedule TMA for tomorrow. Please call for questions. NPO after midnight.  [name]. Stanford Breed, MD [facility] Phone Number: [phone] 08/08/2020 11:14 AM

## 2020-08-08 NOTE — Anesthesia Preprocedure Evaluation (Signed)
Anesthesia Evaluation  Patient identified by MRN, date of birth, ID band Patient awake    Reviewed: Allergy & Precautions, NPO status , Patient's Chart, lab work & pertinent test results  Airway Mallampati: II  TM Distance: >3 FB Neck ROM: Full    Dental  (+) Edentulous Upper   Pulmonary former smoker,    Pulmonary exam normal        Cardiovascular hypertension, + CAD, + Past MI, + Peripheral Vascular Disease and +CHF  Normal cardiovascular exam   '22 TTE - EF 60 to 65%. Grade I diastolic dysfunction (impaired relaxation). Trivial mitral valve regurgitation. Mild aortic valve sclerosis is present, with no evidence of aortic valve stenosis.    Neuro/Psych CVA negative psych ROS   GI/Hepatic GERD  Medicated and Controlled,  Endo/Other  diabetes, Type 2  Renal/GU Dialysis and ESRFRenal disease     Musculoskeletal  (+) Arthritis ,   Abdominal   Peds  Hematology  (+) anemia ,  On plavix INR 2.6    Anesthesia Other Findings Covid test negative   Reproductive/Obstetrics                             Anesthesia Physical Anesthesia Plan  ASA: IV  Anesthesia Plan: MAC   Post-op Pain Management:    Induction: Intravenous  PONV Risk Score and Plan: 1 and Propofol infusion and Treatment may vary due to age or medical condition  Airway Management Planned: Nasal Cannula and Natural Airway  Additional Equipment: None  Intra-op Plan:   Post-operative Plan:   Informed Consent: I have reviewed the patients History and Physical, chart, labs and discussed the procedure including the risks, benefits and alternatives for the proposed anesthesia with the patient or authorized representative who has indicated his/her understanding and acceptance.       Plan Discussed with: CRNA and Anesthesiologist  Anesthesia Plan Comments:         Anesthesia Quick Evaluation

## 2020-08-08 NOTE — Care Management Important Message (Signed)
Important Message  Patient Details  Name: Dennis Macias MRN: [id number] Date of Birth: August 26, 1950   Medicare Important Message Given:  Yes     [name] 08/08/2020, 4:30 PM

## 2020-08-08 NOTE — CV Procedure (Signed)
INDICATIONS: bacteremia  PROCEDURE:   Informed consent was obtained prior to the procedure. The risks, benefits and alternatives for the procedure were discussed and the patient comprehended these risks.  Risks include, but are not limited to, cough, sore throat, vomiting, nausea, somnolence, esophageal and stomach trauma or perforation, bleeding, low blood pressure, aspiration, pneumonia, infection, trauma to the teeth and death.    After a procedural time-out, the oropharynx was anesthetized with 20% benzocaine spray.   During this procedure the patient was administered propofol per anesthesia.  The patient's heart rate, blood pressure, and oxygen saturation were monitored continuously during the procedure. The period of conscious sedation was 30 minutes, of which I was present face-to-face 100% of this time.  The transesophageal probe was inserted in the esophagus and stomach without difficulty and multiple views were obtained.  The patient was kept under observation until the patient left the procedure room.  The patient left the procedure room in stable condition.   Agitated microbubble saline contrast was administered.  COMPLICATIONS:    There were no immediate complications.  FINDINGS:   FORMAL ECHOCARDIOGRAM REPORT PENDING Normal biventricular function.  No valvular vegetation. No LA appendage thrombus.   Incomplete assessment for atrial level shunt. No shunt by color flow. Inadequate bubble study.    RECOMMENDATIONS:     Return to hospital room when stable.   Time Spent Directly with the Patient:  60 minutes   Elouise Munroe 08/08/2020, 2:43 PM

## 2020-08-08 NOTE — Progress Notes (Signed)
HD#10 Subjective:   Tmax 100.8 yesterday afternoon. Afebrile overnight.  During evaluation this morning, patient states he understands he will be having a RLE amputation today. Discussed with him he will also be having a TEE today to check for bacteria in his heart. He denies eating or drinking recently. OT at bedside reports the patient has been reluctant to get up the last couple of days.   Objective:   Vital signs in last 24 hours: Vitals:   08/07/20 1200 08/07/20 1929 08/08/20 0029 08/08/20 0419  BP: 134/70 (!) 155/76 (!) 150/69 (!) 145/69  Pulse: 80 73 74 73  Resp: 20 18 16 17   Temp: (!) 100.8 F (38.2 C) 98.9 F (37.2 C) 98.9 F (37.2 C) 99.9 F (37.7 C)  TempSrc: Oral Oral Oral Oral  SpO2: 99% 99% 99% 100%  Weight:    68.2 kg  Height:       Physical Exam Constitutional:chronically ill-appearingmanlying bed, in no acute distress Cardiovascular: regular rate and rhythm, no m/r/g Pulmonary/Chest: normal work of breathing on room air, lungs clear to auscultation bilaterally MSK: L BKA  Skin:RLE warm, necrotic appearing 2nd, 3rd digits.   Pertinent Labs: CBC Latest Ref Rng & Units 08/08/2020 08/07/2020 08/06/2020  WBC 4.0 - 10.5 K/uL 14.2(H) 13.1(H) 12.6(H)  Hemoglobin 13.0 - 17.0 g/dL 7.5(L) 7.5(L) 7.8(L)  Hematocrit 39.0 - 52.0 % 24.0(L) 22.8(L) 22.4(L)  Platelets 150 - 400 K/uL 383 340 310    CMP Latest Ref Rng & Units 08/08/2020 08/07/2020 08/06/2020  Glucose 70 - 99 mg/dL 124(H) 145(H) 128(H)  BUN 8 - 23 mg/dL 33(H) 23 42(H)  Creatinine 0.61 - 1.24 mg/dL 4.78(H) 3.73(H) 5.45(H)  Sodium 135 - 145 mmol/L 139 138 138  Potassium 3.5 - 5.1 mmol/L 4.3 4.0 4.4  Chloride 98 - 111 mmol/L 100 99 100  CO2 22 - 32 mmol/L 24 26 24   Calcium 8.9 - 10.3 mg/dL 8.5(L) 8.1(L) 8.2(L)  Total Protein 6.5 - 8.1 g/dL - - -  Total Bilirubin 0.3 - 1.2 mg/dL - - -  Alkaline Phos 38 - 126 U/L - - -  AST 15 - 41 U/L - - -  ALT 0 - 44 U/L - - -    Imaging: No results  found.  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 man with history of ESRD on HD TTS, type 2 diabetes mellitus, history of CVA, hypertension, heart failure, and peripheral vascular disease on dual antiplatelet therapy who presented after syncopal episode at dialysis and admitted for further work-up and evaluation.   This is hospital day 10.   Sepsis secondary to MSSA bacteremia R 1st and 2nd digit ulcerations History of Peripheral vascular disease s/p L BKA S/p angioplasty of the R posterior tibial artery POD 3 (1/28) Tmax 100.8 yesterday afternoon. Afebrile overnight. Vitals otherwise stable without tachycardia or  hypotension. Leukocytosis slightly increased from yesterday 13.1->14.2. Due to scheduling conflict with today's TEE, patient will be taken for TMA by ortho tomorrow (2/1). - ID consulted, appreciate recs - IV cefazolin day 5 (day 1: 1/27)   - TTE shows no vegetations, LVEF of 60-65% with no significant changes from last echo - TEE today; If no vegetations, will recommend 2-week antibiotic course - TMA tomorrow (2/1), NPO at midnight - On dual antiplatelet platelet therapy due to PVD.   Syncopal episode Post-HD hypotension ESRD on HD (TTS) Acute ischemic left thalamic infarct History of multiple prior CVAs (2017, 2021) HTN, HLD Patient has not had another episode  of bradycardia/syncope since HD on Mon (1/24). Per nephrology, either HD today or tomorrow depending on scheduling. - Continue home ASA and Plavix - Continue atorvastatin 80 mg  - Continued HD while hospitalized. Appreciate nephrology's assistance.   Acute on chronic anemia FOBT positive Nonerosive esophagitis Hgb stable at 7.5.  - Continue Protonix 40 mg daily indefinitely - AM CBC  Type 2 diabetes mellitus - Lantus 7 units - SSI sensitive - Carb/renal diet if not NPO  Diet: Carb/renal diet, NPO at midnight  IVF: None VTE: SCDs Code: Full PT/OT recs: SNF for Subacute PT or HHPT if patient refuses rehab placement   Please contact the on call pager after 5 pm and on weekends at [phone].  [name], MD PGY-1 Internal Medicine Teaching Service Pager: [phone] 08/08/2020

## 2020-08-08 NOTE — Anesthesia Procedure Notes (Signed)
Date/Time: 08/08/2020 2:16 PM Performed by: Harden Mo, CRNA

## 2020-08-08 NOTE — Anesthesia Postprocedure Evaluation (Signed)
Anesthesia Post Note  Patient: Dennis Macias  Procedure(s) Performed: TRANSESOPHAGEAL ECHOCARDIOGRAM (TEE) (N/A ) BUBBLE STUDY     Patient location during evaluation: PACU Anesthesia Type: MAC Level of consciousness: awake and alert Pain management: pain level controlled Vital Signs Assessment: post-procedure vital signs reviewed and stable Respiratory status: spontaneous breathing, nonlabored ventilation and respiratory function stable Cardiovascular status: stable and blood pressure returned to baseline Anesthetic complications: no   No complications documented.  Last Vitals:  Vitals:   08/08/20 1455 08/08/20 1505  BP: 126/62 (!) 143/70  Pulse: 80 75  Resp: (!) 24 12  Temp:    SpO2: 100% 100%    Last Pain:  Vitals:   08/08/20 1505  TempSrc:   PainSc: 0-No pain                 Audry Pili

## 2020-08-09 ENCOUNTER — Encounter (HOSPITAL_COMMUNITY): Payer: Self-pay | Admitting: Anesthesiology

## 2020-08-09 ENCOUNTER — Encounter (HOSPITAL_COMMUNITY): Payer: Self-pay | Admitting: Internal Medicine

## 2020-08-09 ENCOUNTER — Encounter (HOSPITAL_COMMUNITY)
Admission: EM | Disposition: A | Discharge status: Skilled Nursing Facility | Payer: Self-pay | Source: Home / Self Care | Attending: Internal Medicine

## 2020-08-09 DIAGNOSIS — R7881 Bacteremia: Secondary | ICD-10-CM | POA: Diagnosis not present

## 2020-08-09 DIAGNOSIS — B9561 Methicillin susceptible Staphylococcus aureus infection as the cause of diseases classified elsewhere: Secondary | ICD-10-CM | POA: Diagnosis not present

## 2020-08-09 DIAGNOSIS — I96 Gangrene, not elsewhere classified: Secondary | ICD-10-CM | POA: Diagnosis not present

## 2020-08-09 LAB — RENAL FUNCTION PANEL
Albumin: 2.1 g/dL — ABNORMAL LOW (ref 3.5–5.0)
Anion gap: 16 — ABNORMAL HIGH (ref 5–15)
BUN: 41 mg/dL — ABNORMAL HIGH (ref 8–23)
CO2: 19 mmol/L — ABNORMAL LOW (ref 22–32)
Calcium: 8.3 mg/dL — ABNORMAL LOW (ref 8.9–10.3)
Chloride: 103 mmol/L (ref 98–111)
Creatinine, Ser: 5.46 mg/dL — ABNORMAL HIGH (ref 0.61–1.24)
GFR, Estimated: 11 mL/min — ABNORMAL LOW (ref >60)
Glucose, Bld: 108 mg/dL — ABNORMAL HIGH (ref 70–99)
Phosphorus: 3.8 mg/dL (ref 2.5–4.6)
Potassium: 4.2 mmol/L (ref 3.5–5.1)
Sodium: 138 mmol/L (ref 135–145)

## 2020-08-09 LAB — GLUCOSE, CAPILLARY
Glucose-Capillary: 90 mg/dL (ref 70–99)
Glucose-Capillary: 96 mg/dL (ref 70–99)
Glucose-Capillary: 107 mg/dL — ABNORMAL HIGH (ref 70–99)
Glucose-Capillary: 77 mg/dL (ref 70–99)

## 2020-08-09 LAB — CULTURE, BLOOD (ROUTINE X 2)
Culture: NO GROWTH
Culture: NO GROWTH
Special Requests: ADEQUATE
Special Requests: ADEQUATE

## 2020-08-09 LAB — CBC WITH DIFFERENTIAL/PLATELET
Abs Immature Granulocytes: 0.07 10*3/uL (ref 0.00–0.07)
Basophils Absolute: 0 10*3/uL (ref 0.0–0.1)
Basophils Relative: 0 %
Eosinophils Absolute: 0 10*3/uL (ref 0.0–0.5)
Eosinophils Relative: 0 %
HCT: 23.8 % — ABNORMAL LOW (ref 39.0–52.0)
Hemoglobin: 7.7 g/dL — ABNORMAL LOW (ref 13.0–17.0)
Immature Granulocytes: 1 %
Lymphocytes Relative: 9 %
Lymphs Abs: 1.2 10*3/uL (ref 0.7–4.0)
MCH: 31.6 pg (ref 26.0–34.0)
MCHC: 32.4 g/dL (ref 30.0–36.0)
MCV: 97.5 fL (ref 80.0–100.0)
Monocytes Absolute: 0.7 10*3/uL (ref 0.1–1.0)
Monocytes Relative: 5 %
Neutro Abs: 11.1 10*3/uL — ABNORMAL HIGH (ref 1.7–7.7)
Neutrophils Relative %: 85 %
Platelets: 418 10*3/uL — ABNORMAL HIGH (ref 150–400)
RBC: 2.44 MIL/uL — ABNORMAL LOW (ref 4.22–5.81)
RDW: 14.4 % (ref 11.5–15.5)
WBC: 13.1 10*3/uL — ABNORMAL HIGH (ref 4.0–10.5)
nRBC: 0 % (ref 0.0–0.2)

## 2020-08-09 LAB — HEPATIC FUNCTION PANEL
ALT: <5 U/L (ref 0–44)
AST: 17 U/L (ref 15–41)
Albumin: 2 g/dL — ABNORMAL LOW (ref 3.5–5.0)
Alkaline Phosphatase: 72 U/L (ref 38–126)
Bilirubin, Direct: <0.1 mg/dL (ref 0.0–0.2)
Total Bilirubin: 0.7 mg/dL (ref 0.3–1.2)
Total Protein: 6 g/dL — ABNORMAL LOW (ref 6.5–8.1)

## 2020-08-09 LAB — PROTIME-INR
INR: 2.6 — ABNORMAL HIGH (ref 0.8–1.2)
Prothrombin Time: 26.7 seconds — ABNORMAL HIGH (ref 11.4–15.2)

## 2020-08-09 LAB — SURGICAL PCR SCREEN
MRSA, PCR: NEGATIVE
Staphylococcus aureus: NEGATIVE

## 2020-08-09 SURGERY — AMPUTATION, FOOT, TRANSMETATARSAL
Anesthesia: General | Site: Foot | Laterality: Right

## 2020-08-09 SURGERY — AMPUTATION DIGIT
Anesthesia: General | Laterality: Right

## 2020-08-09 MED ORDER — VANCOMYCIN HCL IN DEXTROSE 1-5 GM/200ML-% IV SOLN
INTRAVENOUS | Status: AC
  Filled 2020-08-09: qty 200

## 2020-08-09 MED ORDER — VANCOMYCIN HCL IN DEXTROSE 1-5 GM/200ML-% IV SOLN: 1000.0000 mg | INTRAVENOUS | Status: DC

## 2020-08-09 MED ORDER — CHLORHEXIDINE GLUCONATE 0.12 % MT SOLN
OROMUCOSAL | Status: AC
  Administered 2020-08-09: 15 mL via OROMUCOSAL
  Filled 2020-08-09: qty 15

## 2020-08-09 MED ORDER — MUPIROCIN 2 % EX OINT
1.0000 "application " | TOPICAL_OINTMENT | Freq: Two times a day (BID) | CUTANEOUS | Status: AC
  Administered 2020-08-09 – 2020-08-13 (×8): 1 via NASAL
  Filled 2020-08-09 (×4): qty 22

## 2020-08-09 MED ORDER — PHYTONADIONE 5 MG PO TABS
2.5000 mg | ORAL_TABLET | Freq: Once | ORAL | Status: AC
  Administered 2020-08-09: 2.5 mg via ORAL
  Filled 2020-08-09: qty 1

## 2020-08-09 MED ORDER — PHYTONADIONE 5 MG PO TABS
7.5000 mg | ORAL_TABLET | Freq: Once | ORAL | Status: AC
  Administered 2020-08-09: 7.5 mg via ORAL
  Filled 2020-08-09: qty 2

## 2020-08-09 MED ORDER — SODIUM CHLORIDE 0.9 % IV SOLN: INTRAVENOUS | Status: DC

## 2020-08-09 MED ORDER — CHLORHEXIDINE GLUCONATE 0.12 % MT SOLN: 15.0000 mL | Freq: Once | OROMUCOSAL | Status: AC

## 2020-08-09 NOTE — Progress Notes (Signed)
[facility] for Infectious Disease   Reason for visit: Follow up on MSSA bacteremia  Interval History: TEE without vegetation. Surgery postponed due to INR at 2.6.  WBC 13.1, afebrile.  He has not complaints today.   Day 7 total antibiotics Day 6 cefazolin  Physical Exam: Constitutional:  Vitals:   08/09/20 0905 08/09/20 1212  BP: (!) 160/68 (!) 157/69  Pulse: 66 66  Resp: 17 16  Temp: 98.8 F (37.1 C) 98.7 F (37.1 C)  SpO2: 100% 100%   patient appears in NAD Respiratory: Normal respiratory effort; CTA B Cardiovascular: RRR MS: right foot with gangrenous toes, similar to previous pictures  Review of Systems: Constitutional: negative for fevers and chills Gastrointestinal: negative for diarrhea  Lab Results  Component Value Date   WBC 13.1 (H) 08/09/2020   HGB 7.7 (L) 08/09/2020   HCT 23.8 (L) 08/09/2020   MCV 97.5 08/09/2020   PLT 418 (H) 08/09/2020    Lab Results  Component Value Date   CREATININE 5.46 (H) 08/09/2020   BUN 41 (H) 08/09/2020   NA 138 08/09/2020   K 4.2 08/09/2020   CL 103 08/09/2020   CO2 19 (L) 08/09/2020    Lab Results  Component Value Date   ALT <5 08/09/2020   AST 17 08/09/2020   ALKPHOS 72 08/09/2020     Microbiology: Recent Results (from the past 240 hour(s))  SARS CORONAVIRUS 2 (TAT 6-24 HRS) Nasopharyngeal Nasopharyngeal Swab     Status: None   Collection Time: 08/03/20  5:12 PM   Specimen: Nasopharyngeal Swab  Result Value Ref Range Status   SARS Coronavirus 2 NEGATIVE NEGATIVE Final    Comment: (NOTE) SARS-CoV-2 target nucleic acids are NOT DETECTED.  The SARS-CoV-2 RNA is generally detectable in upper and lower respiratory specimens during the acute phase of infection. Negative results do not preclude SARS-CoV-2 infection, do not rule out co-infections with other pathogens, and should not be used as the sole basis for treatment or other patient management decisions. Negative results must be combined with clinical  observations, patient history, and epidemiological information. The expected result is Negative.  Fact Sheet for Patients: SugarRoll.be  Fact Sheet for Healthcare Providers: https://www.woods-mathews.com/  This test is not yet approved or cleared by the [place] FDA and  has been authorized for detection and/or diagnosis of SARS-CoV-2 by FDA under an Emergency Use Authorization (EUA). This EUA will remain  in effect (meaning this test can be used) for the duration of the COVID-19 declaration under Se ction 564(b)(1) of the Act, 21 U.S.C. section 360bbb-3(b)(1), unless the authorization is terminated or revoked sooner.  Performed at [facility], [place] [address], [place], [place] [address]   Culture, blood (routine x 2)     Status: Abnormal   Collection Time: 08/03/20  5:58 PM   Specimen: BLOOD  Result Value Ref Range Status   Specimen Description BLOOD SITE NOT SPECIFIED  Final   Special Requests   Final    BOTTLES DRAWN AEROBIC AND ANAEROBIC Blood Culture adequate volume   Culture  Setup Time   Final    GRAM POSITIVE COCCI IN CLUSTERS IN BOTH AEROBIC AND ANAEROBIC BOTTLES CRITICAL RESULT CALLED TO, READ BACK BY AND VERIFIED WITH: Sharen Heck PharmD 12:30 08/04/20 ([email or web address]) Performed at [facility], [place] [address], [place], [place] [address]    Culture STAPHYLOCOCCUS AUREUS (A)  Final   Report Status 08/06/2020 FINAL  Final   Organism ID, Bacteria STAPHYLOCOCCUS AUREUS  Final      Susceptibility   Staphylococcus aureus - MIC*    CIPROFLOXACIN <=0.5 SENSITIVE Sensitive     ERYTHROMYCIN <=0.25 SENSITIVE Sensitive     GENTAMICIN <=0.5 SENSITIVE Sensitive     OXACILLIN 1 SENSITIVE Sensitive     TETRACYCLINE <=1 SENSITIVE Sensitive     VANCOMYCIN <=0.5 SENSITIVE Sensitive     TRIMETH/SULFA <=10 SENSITIVE Sensitive     CLINDAMYCIN <=0.25 SENSITIVE Sensitive     RIFAMPIN <=0.5 SENSITIVE Sensitive     Inducible  Clindamycin NEGATIVE Sensitive     * STAPHYLOCOCCUS AUREUS  Blood Culture ID Panel (Reflexed)     Status: Abnormal   Collection Time: 08/03/20  5:58 PM  Result Value Ref Range Status   Enterococcus faecalis NOT DETECTED NOT DETECTED Final   Enterococcus Faecium NOT DETECTED NOT DETECTED Final   Listeria monocytogenes NOT DETECTED NOT DETECTED Final   Staphylococcus species DETECTED (A) NOT DETECTED Final    Comment: CRITICAL RESULT CALLED TO, READ BACK BY AND VERIFIED WITH: Sharen Heck PharmD 12:30 08/04/20 ([email or web address])    Staphylococcus aureus (BCID) DETECTED (A) NOT DETECTED Final    Comment: CRITICAL RESULT CALLED TO, READ BACK BY AND VERIFIED WITH: Sharen Heck PharmD 12:30 08/04/20 ([email or web address])    Staphylococcus epidermidis NOT DETECTED NOT DETECTED Final   Staphylococcus lugdunensis NOT DETECTED NOT DETECTED Final   Streptococcus species NOT DETECTED NOT DETECTED Final   Streptococcus agalactiae NOT DETECTED NOT DETECTED Final   Streptococcus pneumoniae NOT DETECTED NOT DETECTED Final   Streptococcus pyogenes NOT DETECTED NOT DETECTED Final   A.calcoaceticus-baumannii NOT DETECTED NOT DETECTED Final   Bacteroides fragilis NOT DETECTED NOT DETECTED Final   Enterobacterales NOT DETECTED NOT DETECTED Final   Enterobacter cloacae complex NOT DETECTED NOT DETECTED Final   Escherichia coli NOT DETECTED NOT DETECTED Final   Klebsiella aerogenes NOT DETECTED NOT DETECTED Final   Klebsiella oxytoca NOT DETECTED NOT DETECTED Final   Klebsiella pneumoniae NOT DETECTED NOT DETECTED Final   Proteus species NOT DETECTED NOT DETECTED Final   Salmonella species NOT DETECTED NOT DETECTED Final   Serratia marcescens NOT DETECTED NOT DETECTED Final   Haemophilus influenzae NOT DETECTED NOT DETECTED Final   Neisseria meningitidis NOT DETECTED NOT DETECTED Final   Pseudomonas aeruginosa NOT DETECTED NOT DETECTED Final   Stenotrophomonas maltophilia NOT DETECTED NOT DETECTED Final   Candida albicans  NOT DETECTED NOT DETECTED Final   Candida auris NOT DETECTED NOT DETECTED Final   Candida glabrata NOT DETECTED NOT DETECTED Final   Candida krusei NOT DETECTED NOT DETECTED Final   Candida parapsilosis NOT DETECTED NOT DETECTED Final   Candida tropicalis NOT DETECTED NOT DETECTED Final   Cryptococcus neoformans/gattii NOT DETECTED NOT DETECTED Final   Meth resistant mecA/C and MREJ NOT DETECTED NOT DETECTED Final    Comment: Performed at [facility], [address] [place], [place] [address]  Culture, blood (routine x 2)     Status: Abnormal   Collection Time: 08/03/20  6:03 PM   Specimen: BLOOD  Result Value Ref Range Status   Specimen Description BLOOD SITE NOT SPECIFIED  Final   Special Requests   Final    BOTTLES DRAWN AEROBIC AND ANAEROBIC Blood Culture adequate volume   Culture  Setup Time   Final    GRAM POSITIVE COCCI IN CLUSTERS IN BOTH AEROBIC AND ANAEROBIC BOTTLES CRITICAL VALUE NOTED.  VALUE IS CONSISTENT WITH PREVIOUSLY REPORTED AND CALLED VALUE.    Culture (A)  Final  STAPHYLOCOCCUS AUREUS SUSCEPTIBILITIES PERFORMED ON PREVIOUS CULTURE WITHIN THE LAST 5 DAYS. Performed at [facility], [place] [address] [place], [place] [address]    Report Status 08/06/2020 FINAL  Final  Culture, blood (Routine X 2) w Reflex to ID Panel     Status: None (Preliminary result)   Collection Time: 08/04/20  1:00 PM   Specimen: BLOOD LEFT HAND  Result Value Ref Range Status   Specimen Description BLOOD LEFT HAND  Final   Special Requests   Final    BOTTLES DRAWN AEROBIC AND ANAEROBIC Blood Culture adequate volume   Culture   Final    NO GROWTH 4 DAYS Performed at [facility], [place] [address], [place], [place] [address]    Report Status PENDING  Incomplete  Culture, blood (Routine X 2) w Reflex to ID Panel     Status: None (Preliminary result)   Collection Time: 08/04/20  1:00 PM   Specimen: BLOOD LEFT HAND  Result Value Ref Range Status   Specimen Description  BLOOD LEFT HAND  Final   Special Requests   Final    BOTTLES DRAWN AEROBIC AND ANAEROBIC Blood Culture adequate volume   Culture   Final    NO GROWTH 4 DAYS Performed at [facility], [place] [address], [place], [place] [address]    Report Status PENDING  Incomplete  MRSA PCR Screening     Status: None   Collection Time: 08/05/20  5:11 AM   Specimen: Nasopharyngeal  Result Value Ref Range Status   MRSA by PCR NEGATIVE NEGATIVE Final    Comment:        The GeneXpert MRSA Assay (FDA approved for NASAL specimens only), is one component of a comprehensive MRSA colonization surveillance program. It is not intended to diagnose MRSA infection nor to guide or monitor treatment for MRSA infections. Performed at [facility], [place] [address] [place], [place] [address]   SARS Coronavirus 2 by RT PCR (hospital order, performed in [facility] hospital lab) Nasopharyngeal Nasopharyngeal Swab     Status: None   Collection Time: 08/07/20  1:51 PM   Specimen: Nasopharyngeal Swab  Result Value Ref Range Status   SARS Coronavirus 2 NEGATIVE NEGATIVE Final    Comment: (NOTE) SARS-CoV-2 target nucleic acids are NOT DETECTED.  The SARS-CoV-2 RNA is generally detectable in upper and lower respiratory specimens during the acute phase of infection. The lowest concentration of SARS-CoV-2 viral copies this assay can detect is 250 copies / mL. A negative result does not preclude SARS-CoV-2 infection and should not be used as the sole basis for treatment or other patient management decisions.  A negative result may occur with improper specimen collection / handling, submission of specimen other than nasopharyngeal swab, presence of viral mutation(s) within the areas targeted by this assay, and inadequate number of viral copies (<250 copies / mL). A negative result must be combined with clinical observations, patient history, and epidemiological information.  Fact Sheet for Patients:    StrictlyIdeas.no  Fact Sheet for Healthcare Providers: BankingDealers.co.za  This test is not yet approved or  cleared by the [place] FDA and has been authorized for detection and/or diagnosis of SARS-CoV-2 by FDA under an Emergency Use Authorization (EUA).  This EUA will remain in effect (meaning this test can be used) for the duration of the COVID-19 declaration under Section 564(b)(1) of the Act, 21 U.S.C. section 360bbb-3(b)(1), unless the authorization is terminated or revoked sooner.  Performed  at [facility], [place] [address] [place], [place] [address]   Surgical PCR screen     Status: None   Collection Time: 08/09/20  5:03 AM   Specimen: Nasal Mucosa; Nasal Swab  Result Value Ref Range Status   MRSA, PCR NEGATIVE NEGATIVE Final   Staphylococcus aureus NEGATIVE NEGATIVE Final    Comment: (NOTE) The Xpert SA Assay (FDA approved for NASAL specimens in patients 68 years of age and older), is one component of a comprehensive surveillance program. It is not intended to diagnose infection nor to guide or monitor treatment. Performed at [facility], [place] [address], [place], [place] [address]     Impression/Plan:  1.  Gangrenous right lower extremity - has known PAD and s/p angioplasty done 08/05/20 and plan for TMA with hope of limb salvage.    2.  MSSA bacteremia - TEE negative for vegetation. Repeat blood cultures from 1/27 remain ngtd.  Will continue with cefazolin.   Will not need prolonged antibiotics after amputation.   3.  ESRD - he is continuing dialysis and getting it today.

## 2020-08-09 NOTE — Progress Notes (Signed)
PT Cancellation Note  Patient Details Name: Dennis Macias MRN: [id number] DOB: [birth year]   Cancelled Treatment:    Reason Eval/Treat Not Completed: Patient at procedure or test/unavailable Per RN, pt going to HD. Will follow up as schedule allows.   Dennis Macias, PT, DPT  Acute Rehabilitation Services  Pager: [phone] Office: [phone]    Dennis Macias 08/09/2020, 1:48 PM

## 2020-08-09 NOTE — Progress Notes (Addendum)
[facility] Progress Note   Subjective:   Seen in room.Going for TMA today (rescheduled due to TEE yesterday which was negative for endocarditis). Pt reports ongoing nausea. No CP, palpitations, SOB or dizziness.   Objective Vitals:   08/09/20 0017 08/09/20 0425 08/09/20 0700 08/09/20 0905  BP: 137/60 (!) 153/74 134/72 (!) 160/68  Pulse: 80 74 74 66  Resp: 15 16 16 17   Temp: 99.5 F (37.5 C) 100.2 F (37.9 C) 99.4 F (37.4 C) 98.8 F (37.1 C)  TempSrc: Oral Oral Oral Oral  SpO2: 100% 100% 100% 100%  Weight:  67 kg    Height:       Physical Exam General: WDWN male, alert and in NAD Heart: RRR, no murmurs, rubs or gallops Lungs: CTA bilaterally without wheezing, rhonchi or rales Abdomen: Soft, non-distended, +BS Extremities: R foot with necrotic appearing 2nd toe. No LE edema. Dialysis Access: RUE AVF  +bruit  Additional Objective Labs: Basic Metabolic Panel: Recent Labs  Lab 08/06/20 0332 08/07/20 0425 08/08/20 0240 08/09/20 0205  NA 138 138 139 138  K 4.4 4.0 4.3 4.2  CL 100 99 100 103  CO2 24 26 24  19*  GLUCOSE 128* 145* 124* 108*  BUN 42* 23 33* 41*  CREATININE 5.45* 3.73* 4.78* 5.46*  CALCIUM 8.2* 8.1* 8.5* 8.3*  PHOS 5.7* 3.0  --  3.8   Liver Function Tests: Recent Labs  Lab 08/04/20 0334 08/04/20 2018 08/06/20 0332 08/07/20 0425 08/09/20 0205  AST 22  --   --   --   --   ALT 10  --   --   --   --   ALKPHOS 69  --   --   --   --   BILITOT 0.7  --   --   --   --   PROT 6.2*  --   --   --   --   ALBUMIN 2.5*   < > 2.2* 2.1* 2.1*   < > = values in this interval not displayed.   No results for input(s): LIPASE, AMYLASE in the last 168 hours. CBC: Recent Labs  Lab 08/05/20 0544 08/06/20 0332 08/07/20 0425 08/08/20 0240 08/09/20 0150  WBC 11.7* 12.6* 13.1* 14.2* 13.1*  NEUTROABS 9.8* 10.0* 10.8*  --  11.1*  HGB 8.5* 7.8* 7.5* 7.5* 7.7*  HCT 25.3* 22.4* 22.8* 24.0* 23.8*  MCV 92.0 88.9 90.8 94.9 97.5  PLT 302 310 340 383 418*    Blood Culture    Component Value Date/Time   SDES BLOOD LEFT HAND 08/04/2020 1300   SDES BLOOD LEFT HAND 08/04/2020 1300   SPECREQUEST  08/04/2020 1300    BOTTLES DRAWN AEROBIC AND ANAEROBIC Blood Culture adequate volume   SPECREQUEST  08/04/2020 1300    BOTTLES DRAWN AEROBIC AND ANAEROBIC Blood Culture adequate volume   CULT  08/04/2020 1300    NO GROWTH 4 DAYS Performed at [facility], [place] [address] [place], [place] [address]    CULT  08/04/2020 1300    NO GROWTH 4 DAYS Performed at [facility], [place] [address], [place], [place] [address]    REPTSTATUS PENDING 08/04/2020 1300   REPTSTATUS PENDING 08/04/2020 1300   CBG: Recent Labs  Lab 08/08/20 0813 08/08/20 1717 08/08/20 1734 08/08/20 2149 08/09/20 0748  GLUCAP 112* 99 121* 103* 90   Studies/Results: ECHO TEE  Result Date: 08/08/2020    TRANSESOPHOGEAL ECHO REPORT   Patient Name:   Dennis Macias Date of  Exam: 08/08/2020 Medical Rec #:  [id number]  Height:       72.0 in Accession #:    [id number] Weight:       150.4 lb Date of Birth:  30-Apr-1951  BSA:          1.887 m Patient Age:    70 years   BP:           172/76 mmHg Patient Gender: M          HR:           85 bpm. Exam Location:  Inpatient Procedure: Transesophageal Echo, 3D Echo, Color Doppler, Cardiac Doppler and            Saline Contrast Bubble Study Indications:     Endocarditis  History:         Patient has prior history of Echocardiogram examinations. CHF;                  Risk Factors:Hypertension, Diabetes and Dyslipidemia.  Sonographer:     Raquel Sarna Senior RDCS Referring Phys:  [id number] Leanor Kail Diagnosing Phys: Cherlynn Kaiser MD PROCEDURE: After discussion of the risks and benefits of a TEE, an informed consent was obtained from the patient. TEE procedure time was 17 minutes. The transesophogeal probe was passed without difficulty through the esophogus of the patient. Imaged were obtained with the patient in a left lateral decubitus position.  Local oropharyngeal anesthetic was provided with Cetacaine. Sedation performed by different physician. The patient was monitored while under deep sedation. Anesthestetic sedation was provided intravenously by Anesthesiology: 226mg  of Propofol, 80mg  of Lidocaine. Image quality was good. The patient's vital signs; including heart rate, blood pressure, and oxygen saturation; remained stable throughout the procedure. The patient developed no complications during the procedure. IMPRESSIONS  1. Left ventricular ejection fraction, by estimation, is 60 to 65%. The left ventricle has normal function. The left ventricle has no regional wall motion abnormalities.  2. Right ventricular systolic function is normal. The right ventricular size is normal.  3. Left atrial size was mildly dilated. No left atrial/left atrial appendage thrombus was detected.  4. The mitral valve is normal in structure. Trivial mitral valve regurgitation. No evidence of mitral stenosis.  5. The aortic valve is normal in structure. Aortic valve regurgitation is not visualized. No aortic stenosis is present.  6. Aortic dilatation noted. There is borderline dilatation of the aortic root, measuring 38 mm. There is Moderate (Grade III) plaque involving the transverse and descending aorta.  7. Inadequate agitated saline injection to assess for interatrial shunt. Conclusion(s)/Recommendation(s): No evidence of vegetation/infective endocarditis on this transesophageal echocardiogram. FINDINGS  Left Ventricle: Left ventricular ejection fraction, by estimation, is 60 to 65%. The left ventricle has normal function. The left ventricle has no regional wall motion abnormalities. The left ventricular internal cavity size was normal in size. There is  no left ventricular hypertrophy. Right Ventricle: The right ventricular size is normal. No increase in right ventricular wall thickness. Right ventricular systolic function is normal. Left Atrium: Left atrial size was  mildly dilated. No left atrial/left atrial appendage thrombus was detected. Right Atrium: Right atrial size was normal in size. Pericardium: Trivial pericardial effusion is present. Mitral Valve: The mitral valve is normal in structure. Trivial mitral valve regurgitation. No evidence of mitral valve stenosis. Tricuspid Valve: The tricuspid valve is normal in structure. Tricuspid valve regurgitation is trivial. No evidence of tricuspid stenosis. Aortic Valve: The aortic valve is normal in structure. Aortic valve regurgitation is  not visualized. No aortic stenosis is present. Pulmonic Valve: The pulmonic valve was normal in structure. Pulmonic valve regurgitation is trivial. Aorta: Aortic dilatation noted. There is borderline dilatation of the aortic root, measuring 38 mm. There is moderate (Grade III) plaque involving the transverse and descending aorta. IAS/Shunts: No atrial level shunt detected by color flow Doppler. Agitated saline contrast was given intravenously to evaluate for intracardiac shunting. Inadequate agitated saline injection to assess for interatrial shunt.  AORTIC VALVE LVOT Vmax:   79.50 cm/s LVOT Vmean:  50.400 cm/s LVOT VTI:    0.119 m  SHUNTS Systemic VTI: 0.12 m Cherlynn Kaiser MD Electronically signed by Cherlynn Kaiser MD Signature Date/Time: 08/08/2020/3:48:55 PM    Final    Medications: . [MAR Hold] sodium chloride 250 mL (08/06/20 0342)  . sodium chloride    . [MAR Hold]  ceFAZolin (ANCEF) IV 1 g (08/08/20 1656)  . vancomycin     . [MAR Hold] aspirin EC  81 mg Oral Daily  . [MAR Hold] atorvastatin  80 mg Oral QHS  . chlorhexidine  15 mL Mouth/Throat Once  . [MAR Hold] Chlorhexidine Gluconate Cloth  6 each Topical Q0600  . [MAR Hold] clopidogrel  75 mg Oral Daily  . [MAR Hold] darbepoetin (ARANESP) injection - DIALYSIS  40 mcg Intravenous Q Sat-HD  . [MAR Hold] docusate sodium  100 mg Oral Daily  . [MAR Hold] dorzolamide-timolol  1 drop Right Eye BID  . [MAR Hold] finasteride   5 mg Oral Daily  . [MAR Hold] heparin  4,000 Units Dialysis Once in dialysis  . [MAR Hold] heparin  5,000 Units Subcutaneous Q8H  . [MAR Hold] insulin aspart  0-6 Units Subcutaneous TID WC  . [MAR Hold] insulin glargine  7 Units Subcutaneous QHS  . [MAR Hold] mupirocin ointment  1 application Nasal BID  . [MAR Hold] pantoprazole  40 mg Oral Daily  . [MAR Hold] polyethylene glycol  17 g Oral Daily  . [MAR Hold] sodium chloride flush  3 mL Intravenous Q12H    Dialysis Orders: TTS -[facility] 4hrs, BFR400, [id number], EDW 69kg,2K/2.25Ca Access:RU AVF Heparin4000 unit bolus Hectorol85mcg IV qHD  Assessment/Plan: 1. Sepsis 2/2 MSSA bacteremia. ID consulted, TEE negative for endocarditis, going for  TMA today. Suspected source gangrenous toes.  2. Syncopal episode -likely multifactorial - acute CVA, hypotension, and acute on chronic anemia. Not volume overloaded.Minimal UF with HD. 3. Acute CVA- seen on MRI. Thought to be related to cerebral hypoperfusion with hypotension post HD, as this was the cause of a past stroke. Per neuro/Admit 4. R 1st & 2nd digit ulcerations/Hx PVD: S/p RLE balloon angioplasty to PTA 05/29/20.VVS consulted,wentfor arteriogram01/28/22. Planned for TMA today.  5. ESRD:Continue TTS schedule. HD treatments shortened due to high patient census/staffing shortage. Not uremic or volume overloaded onexam.  6. Hypertension/volume: BP mostly controlled. Below dry wt with no volume overload on exam. Likely losing some weight with NPO status. For HD today, back on TTS schedule.  7. Acute on chronic Anemiaof CKD:+ FOBT, Hgb drop 11.3 (on 1/13 as OP) to 7.4, s/p 1 unit pRBC1/20and another unit on 1/24.GI consulted,underwent EGD1/25which showed esophagitis.Hgb now 7.7. ESA ordered with HD today.  8. Secondary Hyperparathyroidism:Corrected calciumcontrolled, has been running high,Phoswas low andbinders discontinued, now at goal. Hectorolon hold, follow  Ca. 9. Nutrition- Renal diet w/fluid restrictions.  10. DMT2   Anice Paganini, PA-C 08/09/2020, 9:30 AM  [facility] Pager: [phone]  I have seen and examined this patient and agree with  plan and assessment in the above note with renal recommendations/intervention highlighted.  Surgery was postponed due to INR of 2.6 and Hgb 7.7.  Plan for HD today to keep on TTS schedule.  [name] Remigio Mcmillon,MD 08/09/2020 10:22 AM

## 2020-08-09 NOTE — TOC Initial Note (Signed)
Transition of Care [facility]) - Initial/Assessment Note    Patient Details  Name: Dennis Macias MRN: [id number] Date of Birth: 1951-03-06  Transition of Care [facility]) CM/SW Contact:    Trula Ore, [place] Phone Number: 08/09/2020, 5:27 PM  Clinical Narrative:                  CSW received consult for possible SNF placement at time of discharge. CSW spoke with patient regarding PT recommendation of SNF placement at time of discharge.Patient expressed understanding of PT recommendation and is agreeable to SNF placement at time of discharge. Patient gave CSW permission to fax out near [place] area. Patient has received the COVID vaccines. Patient gave CSW permission to discuss his care with his friend [name] and daughter [name].Patient expressed being hopeful for rehab and to feel better soon. No further questions reported at this time. CSW to continue to follow and assist with discharge planning needs.   Expected Discharge Plan: Skilled Nursing Facility Barriers to Discharge: Continued Medical Work up   Patient Goals and CMS Choice Patient states their goals for this hospitalization and ongoing recovery are:: to go to SNF CMS Medicare.gov Compare Post Acute Care list provided to:: Patient Choice offered to / list presented to : Patient  Expected Discharge Plan and Services Expected Discharge Plan: [place] arrangements for the past 2 months: Single Family Home Expected Discharge Date: 08/03/20                         HH Arranged: PT,OT [place] Agency: [place] Date [facility] Agency Contacted: 08/03/20   Representative spoke with at [place]: Tommi Rumps  Prior Living Arrangements/Services Living arrangements for the past 2 months: [place] Lives with:: Self,Other (Comment) (lives with girlfriend) Patient language and need for interpreter reviewed:: Yes Do you feel safe going back to the place where you live?: No   SNF  Need for Family  Participation in Patient Care: Yes (Comment) Care giver support system in place?: Yes (comment)   Criminal Activity/Legal Involvement Pertinent to Current Situation/Hospitalization: No - Comment as needed  Activities of Daily Living      Permission Sought/Granted Permission sought to share information with : Case Manager,Family Chief Financial Officer Permission granted to share information with : Yes, Verbal Permission Granted  Share Information with NAME: [name] and [name]  Permission granted to share info w AGENCY: SNF  Permission granted to share info w Relationship: Friend and Daughter  Permission granted to share info w Contact Information: [phone] [name] [phone]  Emotional Assessment Appearance:: Appears stated age Attitude/Demeanor/Rapport: Gracious Affect (typically observed): Calm Orientation: : Oriented to Self,Oriented to Place,Oriented to  Time,Oriented to Situation Alcohol / Substance Use: Not Applicable Psych Involvement: No (comment)  Admission diagnosis:  Syncope and collapse [R55] Syncope [R55] Gastrointestinal hemorrhage, unspecified gastrointestinal hemorrhage type [K92.2] Anemia, unspecified type [D64.9] Stroke [facility]) [I63.9] Patient Active Problem List   Diagnosis Date Noted  . Fever   . Nonerosive esophageal reflux disease 08/02/2020  . Hiatal hernia with GERD and esophagitis 08/02/2020  . Gastroesophageal reflux disease with esophagitis   . Dark stools   . Stroke ([place]) 07/29/2020  . Syncope 07/28/2020  . Acute cystitis 05/03/2020  . S/P BKA (below knee amputation) ([place]) 03/31/2020  . PAD (peripheral artery disease) ([place]) 02/18/2020  . Orthostatic hypotension 02/18/2020  . Acute cerebrovascular accident (CVA) due to ischemia ([place]) 02/17/2020  . ESRD (end  stage renal disease) ([place])   . Goals of care, counseling/discussion   . Acute on chronic combined systolic and diastolic CHF (congestive heart failure) ([place]) 12/24/2019   . Non-Obstructive CAD 12/14/2019  . BPH (benign prostatic hyperplasia) 12/08/2019  . Anemia of chronic disease   . Protein-calorie malnutrition, severe 11/19/2019  . MSSA bacteremia   . Gait disorder   . Chronic combined systolic and diastolic heart failure ([place])   . Left renal mass 07/21/2019  . CKD (chronic kidney disease) stage 4, GFR 15-29 ml/min (HCC) 04/23/2019  . Weight loss, non-intentional 08/26/2017  . 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 Anemia 12/21/2016  . Hyperlipidemia 09/13/2015  . 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 Essential hypertension    PCP:  [name], MD Pharmacy:   [place], [place] - [address] [facility]. [address] [place] [address] Phone: [phone] Fax: [place], [place] - [address] [address] Phone: [phone] Fax: [phone]     Social Determinants of Health (SDOH) Interventions    Readmission Risk Interventions Readmission Risk Prevention Plan 01/19/2020 11/10/2019 08/10/2019  Transportation Screening Complete Complete Complete  PCP or Specialist Appt within 3-5 Days - - Complete  HRI or Early - - Complete  Social Work Consult for [place] Planning/Counseling - - Complete  Palliative Care Screening - - Complete  Medication Review Press photographer) Complete Complete Complete  PCP or Specialist appointment within 3-5 days of discharge Complete - -  [place] or Home Care Consult Complete Complete -  SW Recovery Care/Counseling Consult Complete Complete -  Palliative Care Screening Complete Not Applicable -  [place] Complete Complete -  Some recent data might be hidden

## 2020-08-09 NOTE — NC FL2 (Signed)
Advance LEVEL OF CARE SCREENING TOOL     IDENTIFICATION  Patient Name: Dennis Macias Birthdate: Jun 17, 1951 Sex: male Admission Date (Current Location): 07/28/2020  [facility] and [place] Number:  Herbalist and Address:  [facility] [place], [place] [address] [place], [place] [address]      Provider Number: [id number]  Attending Physician Name and Address:  Aldine Contes, MD  Relative Name and Phone Number:  [name]  [phone] and [name] [phone]    Current Level of Care: Hospital Recommended Level of Care: [place] Prior Approval Number:    Date Approved/Denied:   PASRR Number: [id number]  Discharge Plan: SNF    Current Diagnoses: Patient Active Problem List   Diagnosis Date Noted  . Fever   . Nonerosive esophageal reflux disease 08/02/2020  . Hiatal hernia with GERD and esophagitis 08/02/2020  . Gastroesophageal reflux disease with esophagitis   . Dark stools   . Stroke ([place]) 07/29/2020  . Syncope 07/28/2020  . Acute cystitis 05/03/2020  . S/P BKA (below knee amputation) ([place]) 03/31/2020  . PAD (peripheral artery disease) ([place]) 02/18/2020  . Orthostatic hypotension 02/18/2020  . Acute cerebrovascular accident (CVA) due to ischemia ([place]) 02/17/2020  . ESRD (end stage renal disease) ([place])   . Goals of care, counseling/discussion   . Acute on chronic combined systolic and diastolic CHF (congestive heart failure) ([place]) 12/24/2019  . Non-Obstructive CAD 12/14/2019  . BPH (benign prostatic hyperplasia) 12/08/2019  . Anemia of chronic disease   . Protein-calorie malnutrition, severe 11/19/2019  . MSSA bacteremia   . Gait disorder   . Chronic combined systolic and diastolic heart failure ([place])   . Left renal mass 07/21/2019  . CKD (chronic kidney disease) stage 4, GFR 15-29 ml/min (HCC) 04/23/2019  . Weight loss, non-intentional 08/26/2017  . 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 Anemia 12/21/2016  .  Hyperlipidemia 09/13/2015  . 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 Essential hypertension     Orientation RESPIRATION BLADDER Height & Weight     Self,Time,Situation,Place  Normal Continent Weight: 147 lb 11.3 oz (67 kg) Height:  6' (182.9 cm)  BEHAVIORAL SYMPTOMS/MOOD NEUROLOGICAL BOWEL NUTRITION STATUS      Incontinent Diet (See Discharge Summary)  AMBULATORY STATUS COMMUNICATION OF NEEDS Skin   Limited Assist Verbally Other (Comment) (amputation leg L,cracking location toe right, wound incision open dehisced toe anterior R)                       Personal Care Assistance Level of Assistance  Bathing,Feeding,Dressing Bathing Assistance: Limited assistance Feeding assistance: Limited assistance (Needs set up,able to feed self with adaptive devices,Renal Carb modified) Dressing Assistance: Limited assistance     Functional Limitations Info  Sight,Hearing,Speech Sight Info: Impaired (Blind in Right eye) Hearing Info: Adequate Speech Info: Adequate    SPECIAL CARE FACTORS FREQUENCY  PT (By licensed PT),OT (By licensed OT)     PT Frequency: 5x min weekly OT Frequency: 5x min weekly            Contractures Contractures Info: Not present    Additional Factors Info  Code Status,Allergies,Insulin Sliding Scale Code Status Info: FULL Allergies Info: Cefepime   Insulin Sliding Scale Info: insulin aspart (novoLOG) injection 0-6 Units 3 times daily with meals,insulin glargine (LANTUS) injection 7 Units daily at bedtime       Current Medications (08/09/2020):  This is the current hospital active medication list Current Facility-Administered Medications  Medication Dose Route Frequency Provider Last Rate Last Admin  . 0.9 %  sodium chloride infusion  250 mL Intravenous PRN Angelia Mould, MD 10 mL/hr at 08/06/20 0342 250 mL at 08/06/20 0342  . acetaminophen (TYLENOL) tablet 650 mg  650 mg Oral Q4H PRN Angelia Mould, MD   650 mg at  08/09/20 7253   Or  . acetaminophen (TYLENOL) suppository 650 mg  650 mg Rectal Q6H PRN Angelia Mould, MD      . acetaminophen (TYLENOL) tablet 650 mg  650 mg Oral Q4H PRN Lucious Groves, DO      . aspirin EC tablet 81 mg  81 mg Oral Daily Angelia Mould, MD   81 mg at 08/09/20 1115  . atorvastatin (LIPITOR) tablet 80 mg  80 mg Oral QHS Angelia Mould, MD   80 mg at 08/08/20 2220  . ceFAZolin (ANCEF) IVPB 1 g/50 mL premix  1 g Intravenous Q24H Lucious Groves, DO 100 mL/hr at 08/09/20 1718 1 g at 08/09/20 1718  . Chlorhexidine Gluconate Cloth 2 % PADS 6 each  6 each Topical Q0600 Angelia Mould, MD   6 each at 08/09/20 [phone]  . clopidogrel (PLAVIX) tablet 75 mg  75 mg Oral Daily Angelia Mould, MD   75 mg at 08/09/20 1116  . Darbepoetin Alfa (ARANESP) injection 40 mcg  40 mcg Intravenous Q Sat-HD Angelia Mould, MD   40 mcg at 08/06/20 1458  . docusate sodium (COLACE) capsule 100 mg  100 mg Oral Daily Angelia Mould, MD   100 mg at 08/08/20 1106  . dorzolamide-timolol (COSOPT) 22.3-6.8 MG/ML ophthalmic solution 1 drop  1 drop Right Eye BID Angelia Mould, MD   1 drop at 08/09/20 1116  . finasteride (PROSCAR) tablet 5 mg  5 mg Oral Daily Angelia Mould, MD   5 mg at 08/09/20 1117  . heparin injection 4,000 Units  4,000 Units Dialysis Once in dialysis Valentina Gu, NP      . hydrALAZINE (APRESOLINE) injection 5 mg  5 mg Intravenous Q20 Min PRN Angelia Mould, MD      . insulin aspart (novoLOG) injection 0-6 Units  0-6 Units Subcutaneous TID WC Angelia Mould, MD   1 Units at 08/06/20 [phone]  . insulin glargine (LANTUS) injection 7 Units  7 Units Subcutaneous QHS Angelia Mould, MD   7 Units at 08/08/20 2219  . labetalol (NORMODYNE) injection 10 mg  10 mg Intravenous Q10 min PRN Angelia Mould, MD   10 mg at 08/05/20 1313  . mupirocin ointment (BACTROBAN) 2 % 1 application  1 application  Nasal BID Serafina Mitchell, MD   1 application at 42/59/56 1000  . ondansetron (ZOFRAN) injection 4 mg  4 mg Intravenous Q6H PRN Angelia Mould, MD   4 mg at 08/09/20 1328  . pantoprazole (PROTONIX) EC tablet 40 mg  40 mg Oral Daily [name], MD   40 mg at 08/09/20 1118  . polyethylene glycol (MIRALAX / GLYCOLAX) packet 17 g  17 g Oral Daily Maudie Mercury, MD   17 g at 08/06/20 1229  . sodium chloride flush (NS) 0.9 % injection 3 mL  3 mL Intravenous Q12H Angelia Mould, MD   3 mL at 08/06/20 2154  . sodium chloride flush (NS) 0.9 % injection 3 mL  3 mL Intravenous PRN Angelia Mould, MD         Discharge Medications:  Please see discharge summary for a list of discharge medications.  Relevant Imaging Results:  Relevant Lab Results:   Additional Information ([id number]  Trula Ore, LCSWA

## 2020-08-09 NOTE — Progress Notes (Addendum)
HD#11 Subjective:   Tmax 100.2 this morning.  Patient resting at bedside, discussed that INR elevated today and will not be able to have surgery, and plan for dialysis today. No acute complaints.   Objective:   Vital signs in last 24 hours: Vitals:   08/08/20 1747 08/08/20 2016 08/09/20 0017 08/09/20 0425  BP: (!) 157/73 (!) 159/73 137/60 (!) 153/74  Pulse: 72 78 80 74  Resp:  13 15 16   Temp:  99.5 F (37.5 C) 99.5 F (37.5 C) 100.2 F (37.9 C)  TempSrc:  Oral Oral Oral  SpO2: 100% 100% 100% 100%  Weight:    67 kg  Height:       Physical Exam Constitutional:chronically ill-appearingmanlying bed, in no acute distress Cardiovascular: regular rate and rhythm, no m/r/g Pulmonary/Chest: normal work of breathing on room air, lungs clear to auscultation bilaterally MSK: L BKA, dry gangrene of right toes Skin:RLE warm, necrotic appearing 2nd, 3rd digits.   Pertinent Labs: CBC Latest Ref Rng & Units 08/08/2020 08/07/2020 08/06/2020  WBC 4.0 - 10.5 K/uL 14.2(H) 13.1(H) 12.6(H)  Hemoglobin 13.0 - 17.0 g/dL 7.5(L) 7.5(L) 7.8(L)  Hematocrit 39.0 - 52.0 % 24.0(L) 22.8(L) 22.4(L)  Platelets 150 - 400 K/uL 383 340 310    CMP Latest Ref Rng & Units 08/09/2020 08/08/2020 08/07/2020  Glucose 70 - 99 mg/dL 108(H) 124(H) 145(H)  BUN 8 - 23 mg/dL 41(H) 33(H) 23  Creatinine 0.61 - 1.24 mg/dL 5.46(H) 4.78(H) 3.73(H)  Sodium 135 - 145 mmol/L 138 139 138  Potassium 3.5 - 5.1 mmol/L 4.2 4.3 4.0  Chloride 98 - 111 mmol/L 103 100 99  CO2 22 - 32 mmol/L 19(L) 24 26  Calcium 8.9 - 10.3 mg/dL 8.3(L) 8.5(L) 8.1(L)  Total Protein 6.5 - 8.1 g/dL - - -  Total Bilirubin 0.3 - 1.2 mg/dL - - -  Alkaline Phos 38 - 126 U/L - - -  AST 15 - 41 U/L - - -  ALT 0 - 44 U/L - - -    Imaging: ECHO TEE  Result Date: 08/08/2020   IMPRESSIONS  1. Left ventricular ejection fraction, by estimation, is 60 to 65%. The left ventricle has normal function. The left ventricle has no regional wall motion  abnormalities.   2. Right ventricular systolic function is normal. The right ventricular size is normal.   3. Left atrial size was mildly dilated. No left atrial/left atrial appendage thrombus was detected.   4. The mitral valve is normal in structure. Trivial mitral valve regurgitation. No evidence of mitral stenosis.   5. The aortic valve is normal in structure. Aortic valve regurgitation is not visualized. No aortic stenosis is present.   6. Aortic dilatation noted. There is borderline dilatation of the aortic root, measuring 38 mm. There is Moderate (Grade III) plaque involving the transverse and descending aorta.   7. Inadequate agitated saline injection to assess for interatrial shunt. Conclusion(s)/Recommendation(s): No evidence of vegetation/infective endocarditis on this transesophageal echocardiogram.      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 man with history of ESRD on HD TTS, type 2 diabetes mellitus, history of CVA, hypertension, heart failure, and peripheral vascular disease on dual antiplatelet therapy who presented after syncopal episode at dialysis and admitted for MSSA bacteremia in the setting of R1st and F2nd digit ulcerations.   This is hospital day 11.  Sepsis secondary to MSSA bacteremia R 1st and 2nd digit ulcerations History of Peripheral vascular  disease s/p L BKA S/p angioplasty of the R posterior tibial artery POD 3 (1/28) T 100.2 this morning. Afebrile overnight. Vitals otherwise stable. Leukocytosis Improving 14.2>13.1. TEE without evidence of vegetations or infective endocarditis. Unfortunatly TMA will need to be rescheduled due to elevated INR of 2.6. - discussed with vascular surgery, plan to reschedule TMA when INR <2 due to concern for poor wound healing should he develop a hematoma - ID consulted, appreciate recs - IV cefazolin day 6 /14  (day 1: 1/27, last day 2/9)   - TTE and TEE  negative - On dual antiplatelet platelet therapy due to PVD.   Elevated INR  Patient with 2.6 today and yesterday. INR <2 prior to hospitalization. Not on anticoagulation. Differential includes liver disease, vitamin K deficiency, medication interaction with cefazolin although this is very rare. - CMP - Vitamin K 10 mg  - hold heparin - AM INR   Syncopal episode Post-HD hypotension ESRD on HD (TTS) Acute ischemic left thalamic infarct History of multiple prior CVAs (2017, 2021) HTN, HLD Patient has not had another episode of bradycardia/syncope since HD on Mon (1/24). Did not get dialysis yesterday. - HD today - Continue home ASA and Plavix - Continue atorvastatin 80 mg  - Continued HD while hospitalized. Appreciate nephrology's assistance.   Acute on chronic anemia FOBT positive Nonerosive esophagitis Hgb stable at 7.5. - Continue Protonix 40 mg daily indefinitely - monitor CBC  Type 2 diabetes mellitus - Lantus 7 units - SSI sensitive - Carb/renal diet if not NPO  Diet: Carb/renal diet IVF: None VTE: SCDs Code: Full PT/OT recs: SNF for Subacute PT or HHPT if patient refuses rehab placement   Please contact the on call pager after 5 pm and on weekends at [phone].  Iona Beard, MD PGY-1 Internal Medicine Teaching Service Pager: [phone] 08/09/2020

## 2020-08-09 NOTE — Progress Notes (Signed)
Spoke to Dr. Donnetta Hutching regarding PT/ INR and CBC from this am. Per Dr. Donnetta Hutching case will need to be rescheduled. Patient notified. Report called to Janett Billow, RN on 6E.

## 2020-08-10 ENCOUNTER — Inpatient Hospital Stay: Payer: Medicare PPO | Admitting: Student

## 2020-08-10 ENCOUNTER — Encounter: Payer: Self-pay | Admitting: Dietician

## 2020-08-10 ENCOUNTER — Encounter (HOSPITAL_COMMUNITY)
Admission: EM | Disposition: A | Discharge status: Skilled Nursing Facility | Payer: Self-pay | Source: Home / Self Care | Attending: Internal Medicine

## 2020-08-10 DIAGNOSIS — A419 Sepsis, unspecified organism: Secondary | ICD-10-CM

## 2020-08-10 DIAGNOSIS — R1110 Vomiting, unspecified: Secondary | ICD-10-CM

## 2020-08-10 DIAGNOSIS — R7881 Bacteremia: Secondary | ICD-10-CM | POA: Diagnosis not present

## 2020-08-10 DIAGNOSIS — I96 Gangrene, not elsewhere classified: Secondary | ICD-10-CM | POA: Diagnosis not present

## 2020-08-10 DIAGNOSIS — R7989 Other specified abnormal findings of blood chemistry: Secondary | ICD-10-CM

## 2020-08-10 DIAGNOSIS — B9561 Methicillin susceptible Staphylococcus aureus infection as the cause of diseases classified elsewhere: Secondary | ICD-10-CM | POA: Diagnosis not present

## 2020-08-10 DIAGNOSIS — E119 Type 2 diabetes mellitus without complications: Secondary | ICD-10-CM

## 2020-08-10 LAB — CBC
HCT: 20.2 % — ABNORMAL LOW (ref 39.0–52.0)
HCT: 25.3 % — ABNORMAL LOW (ref 39.0–52.0)
HCT: 30.7 % — ABNORMAL LOW (ref 39.0–52.0)
Hemoglobin: 6.8 g/dL — CL (ref 13.0–17.0)
Hemoglobin: 7.8 g/dL — ABNORMAL LOW (ref 13.0–17.0)
Hemoglobin: 9.7 g/dL — ABNORMAL LOW (ref 13.0–17.0)
MCH: 31.2 pg (ref 26.0–34.0)
MCH: 29.4 pg (ref 26.0–34.0)
MCH: 29.1 pg (ref 26.0–34.0)
MCHC: 33.7 g/dL (ref 30.0–36.0)
MCHC: 30.8 g/dL (ref 30.0–36.0)
MCHC: 31.6 g/dL (ref 30.0–36.0)
MCV: 92.7 fL (ref 80.0–100.0)
MCV: 95.5 fL (ref 80.0–100.0)
MCV: 92.2 fL (ref 80.0–100.0)
Platelets: 502 10*3/uL — ABNORMAL HIGH (ref 150–400)
Platelets: 497 10*3/uL — ABNORMAL HIGH (ref 150–400)
Platelets: 512 10*3/uL — ABNORMAL HIGH (ref 150–400)
RBC: 2.18 MIL/uL — ABNORMAL LOW (ref 4.22–5.81)
RBC: 2.65 MIL/uL — ABNORMAL LOW (ref 4.22–5.81)
RBC: 3.33 MIL/uL — ABNORMAL LOW (ref 4.22–5.81)
RDW: 14.5 % (ref 11.5–15.5)
RDW: 14.5 % (ref 11.5–15.5)
RDW: 16 % — ABNORMAL HIGH (ref 11.5–15.5)
WBC: 16.6 10*3/uL — ABNORMAL HIGH (ref 4.0–10.5)
WBC: 15.3 10*3/uL — ABNORMAL HIGH (ref 4.0–10.5)
WBC: 18.3 10*3/uL — ABNORMAL HIGH (ref 4.0–10.5)
nRBC: 0 % (ref 0.0–0.2)
nRBC: 0 % (ref 0.0–0.2)
nRBC: 0 % (ref 0.0–0.2)

## 2020-08-10 LAB — GLUCOSE, CAPILLARY
Glucose-Capillary: 103 mg/dL — ABNORMAL HIGH (ref 70–99)
Glucose-Capillary: 113 mg/dL — ABNORMAL HIGH (ref 70–99)
Glucose-Capillary: 118 mg/dL — ABNORMAL HIGH (ref 70–99)
Glucose-Capillary: 111 mg/dL — ABNORMAL HIGH (ref 70–99)

## 2020-08-10 LAB — RENAL FUNCTION PANEL
Albumin: 1.9 g/dL — ABNORMAL LOW (ref 3.5–5.0)
Anion gap: 15 (ref 5–15)
BUN: 18 mg/dL (ref 8–23)
CO2: 27 mmol/L (ref 22–32)
Calcium: 8.2 mg/dL — ABNORMAL LOW (ref 8.9–10.3)
Chloride: 97 mmol/L — ABNORMAL LOW (ref 98–111)
Creatinine, Ser: 3.48 mg/dL — ABNORMAL HIGH (ref 0.61–1.24)
GFR, Estimated: 18 mL/min — ABNORMAL LOW (ref >60)
Glucose, Bld: 120 mg/dL — ABNORMAL HIGH (ref 70–99)
Phosphorus: 2.4 mg/dL — ABNORMAL LOW (ref 2.5–4.6)
Potassium: 4.1 mmol/L (ref 3.5–5.1)
Sodium: 139 mmol/L (ref 135–145)

## 2020-08-10 LAB — PREPARE RBC (CROSSMATCH)

## 2020-08-10 LAB — PROTIME-INR
INR: 1.8 — ABNORMAL HIGH (ref 0.8–1.2)
Prothrombin Time: 19.9 seconds — ABNORMAL HIGH (ref 11.4–15.2)

## 2020-08-10 SURGERY — AMPUTATION, FOOT, TRANSMETATARSAL
Anesthesia: General | Site: Toe | Laterality: Right

## 2020-08-10 MED ORDER — PROMETHAZINE HCL 25 MG/ML IJ SOLN
12.5000 mg | Freq: Four times a day (QID) | INTRAMUSCULAR | Status: DC | PRN
  Administered 2020-08-11 – 2020-08-14 (×2): 12.5 mg via INTRAVENOUS
  Filled 2020-08-10 (×2): qty 1

## 2020-08-10 MED ORDER — PANTOPRAZOLE SODIUM 40 MG IV SOLR
40.0000 mg | Freq: Every day | INTRAVENOUS | Status: DC
  Administered 2020-08-10 – 2020-08-14 (×5): 40 mg via INTRAVENOUS
  Filled 2020-08-10 (×5): qty 40

## 2020-08-10 MED ORDER — SODIUM CHLORIDE 0.9% IV SOLUTION: Freq: Once | INTRAVENOUS | Status: AC

## 2020-08-10 MED ORDER — CHLORHEXIDINE GLUCONATE CLOTH 2 % EX PADS
6.0000 | MEDICATED_PAD | Freq: Every day | CUTANEOUS | Status: DC
  Administered 2020-08-10 – 2020-08-12 (×3): 6 via TOPICAL

## 2020-08-10 NOTE — Progress Notes (Signed)
[facility] for Infectious Disease   Reason for visit: Follow up on MSSA bacteremia  Interval History: surgery planned for today.  WBC stable at 16.6.  Remains afebrile.  No new complaints.  Day 8 total antibiotics Day 7 cefazolin  Physical Exam: Constitutional:  Vitals:   08/10/20 0929 08/10/20 0930  BP: 136/70 137/73  Pulse: 72 72  Resp: 16 16  Temp: 99.4 F (37.4 C) 99.4 F (37.4 C)  SpO2: 99%   He appears in nad Respiratory: normal respiratory effort Cardiovascular:RRR MS: right foot with gangrenous toes  Review of Systems: Constitutional: no fever Gastrointestinal: no nausea, no diarrhea  Lab Results  Component Value Date   WBC 16.6 (H) 08/10/2020   HGB 6.8 (LL) 08/10/2020   HCT 20.2 (L) 08/10/2020   MCV 92.7 08/10/2020   PLT 502 (H) 08/10/2020    Lab Results  Component Value Date   CREATININE 3.48 (H) 08/10/2020   BUN 18 08/10/2020   NA 139 08/10/2020   K 4.1 08/10/2020   CL 97 (L) 08/10/2020   CO2 27 08/10/2020    Lab Results  Component Value Date   ALT <5 08/09/2020   AST 17 08/09/2020   ALKPHOS 72 08/09/2020     Microbiology: Recent Results (from the past 240 hour(s))  SARS CORONAVIRUS 2 (TAT 6-24 HRS) Nasopharyngeal Nasopharyngeal Swab     Status: None   Collection Time: 08/03/20  5:12 PM   Specimen: Nasopharyngeal Swab  Result Value Ref Range Status   SARS Coronavirus 2 NEGATIVE NEGATIVE Final    Comment: (NOTE) SARS-CoV-2 target nucleic acids are NOT DETECTED.  The SARS-CoV-2 RNA is generally detectable in upper and lower respiratory specimens during the acute phase of infection. Negative results do not preclude SARS-CoV-2 infection, do not rule out co-infections with other pathogens, and should not be used as the sole basis for treatment or other patient management decisions. Negative results must be combined with clinical observations, patient history, and epidemiological information. The expected result is Negative.  Fact  Sheet for Patients: SugarRoll.be  Fact Sheet for Healthcare Providers: https://www.woods-mathews.com/  This test is not yet approved or cleared by the [place] FDA and  has been authorized for detection and/or diagnosis of SARS-CoV-2 by FDA under an Emergency Use Authorization (EUA). This EUA will remain  in effect (meaning this test can be used) for the duration of the COVID-19 declaration under Se ction 564(b)(1) of the Act, 21 U.S.C. section 360bbb-3(b)(1), unless the authorization is terminated or revoked sooner.  Performed at [facility], [place] [address], [place], [place] [address]   Culture, blood (routine x 2)     Status: Abnormal   Collection Time: 08/03/20  5:58 PM   Specimen: BLOOD  Result Value Ref Range Status   Specimen Description BLOOD SITE NOT SPECIFIED  Final   Special Requests   Final    BOTTLES DRAWN AEROBIC AND ANAEROBIC Blood Culture adequate volume   Culture  Setup Time   Final    GRAM POSITIVE COCCI IN CLUSTERS IN BOTH AEROBIC AND ANAEROBIC BOTTLES CRITICAL RESULT CALLED TO, READ BACK BY AND VERIFIED WITH: Sharen Heck PharmD 12:30 08/04/20 ([email or web address]) Performed at [facility], [place] [address], [place], [place] [address]    Culture STAPHYLOCOCCUS AUREUS (A)  Final   Report Status 08/06/2020 FINAL  Final   Organism ID, Bacteria STAPHYLOCOCCUS AUREUS  Final      Susceptibility   Staphylococcus aureus - MIC*    CIPROFLOXACIN <=  0.5 SENSITIVE Sensitive     ERYTHROMYCIN <=0.25 SENSITIVE Sensitive     GENTAMICIN <=0.5 SENSITIVE Sensitive     OXACILLIN 1 SENSITIVE Sensitive     TETRACYCLINE <=1 SENSITIVE Sensitive     VANCOMYCIN <=0.5 SENSITIVE Sensitive     TRIMETH/SULFA <=10 SENSITIVE Sensitive     CLINDAMYCIN <=0.25 SENSITIVE Sensitive     RIFAMPIN <=0.5 SENSITIVE Sensitive     Inducible Clindamycin NEGATIVE Sensitive     * STAPHYLOCOCCUS AUREUS  Blood Culture ID Panel (Reflexed)     Status:  Abnormal   Collection Time: 08/03/20  5:58 PM  Result Value Ref Range Status   Enterococcus faecalis NOT DETECTED NOT DETECTED Final   Enterococcus Faecium NOT DETECTED NOT DETECTED Final   Listeria monocytogenes NOT DETECTED NOT DETECTED Final   Staphylococcus species DETECTED (A) NOT DETECTED Final    Comment: CRITICAL RESULT CALLED TO, READ BACK BY AND VERIFIED WITH: Sharen Heck PharmD 12:30 08/04/20 ([email or web address])    Staphylococcus aureus (BCID) DETECTED (A) NOT DETECTED Final    Comment: CRITICAL RESULT CALLED TO, READ BACK BY AND VERIFIED WITH: Sharen Heck PharmD 12:30 08/04/20 ([email or web address])    Staphylococcus epidermidis NOT DETECTED NOT DETECTED Final   Staphylococcus lugdunensis NOT DETECTED NOT DETECTED Final   Streptococcus species NOT DETECTED NOT DETECTED Final   Streptococcus agalactiae NOT DETECTED NOT DETECTED Final   Streptococcus pneumoniae NOT DETECTED NOT DETECTED Final   Streptococcus pyogenes NOT DETECTED NOT DETECTED Final   A.calcoaceticus-baumannii NOT DETECTED NOT DETECTED Final   Bacteroides fragilis NOT DETECTED NOT DETECTED Final   Enterobacterales NOT DETECTED NOT DETECTED Final   Enterobacter cloacae complex NOT DETECTED NOT DETECTED Final   Escherichia coli NOT DETECTED NOT DETECTED Final   Klebsiella aerogenes NOT DETECTED NOT DETECTED Final   Klebsiella oxytoca NOT DETECTED NOT DETECTED Final   Klebsiella pneumoniae NOT DETECTED NOT DETECTED Final   Proteus species NOT DETECTED NOT DETECTED Final   Salmonella species NOT DETECTED NOT DETECTED Final   Serratia marcescens NOT DETECTED NOT DETECTED Final   Haemophilus influenzae NOT DETECTED NOT DETECTED Final   Neisseria meningitidis NOT DETECTED NOT DETECTED Final   Pseudomonas aeruginosa NOT DETECTED NOT DETECTED Final   Stenotrophomonas maltophilia NOT DETECTED NOT DETECTED Final   Candida albicans NOT DETECTED NOT DETECTED Final   Candida auris NOT DETECTED NOT DETECTED Final   Candida glabrata NOT  DETECTED NOT DETECTED Final   Candida krusei NOT DETECTED NOT DETECTED Final   Candida parapsilosis NOT DETECTED NOT DETECTED Final   Candida tropicalis NOT DETECTED NOT DETECTED Final   Cryptococcus neoformans/gattii NOT DETECTED NOT DETECTED Final   Meth resistant mecA/C and MREJ NOT DETECTED NOT DETECTED Final    Comment: Performed at [facility], [address], [place], [place] [address]  Culture, blood (routine x 2)     Status: Abnormal   Collection Time: 08/03/20  6:03 PM   Specimen: BLOOD  Result Value Ref Range Status   Specimen Description BLOOD SITE NOT SPECIFIED  Final   Special Requests   Final    BOTTLES DRAWN AEROBIC AND ANAEROBIC Blood Culture adequate volume   Culture  Setup Time   Final    GRAM POSITIVE COCCI IN CLUSTERS IN BOTH AEROBIC AND ANAEROBIC BOTTLES CRITICAL VALUE NOTED.  VALUE IS CONSISTENT WITH PREVIOUSLY REPORTED AND CALLED VALUE.    Culture (A)  Final    STAPHYLOCOCCUS AUREUS SUSCEPTIBILITIES PERFORMED ON PREVIOUS CULTURE WITHIN THE LAST 5 DAYS. Performed at [facility]  Lab, [address], [place], [place] [address]    Report Status 08/06/2020 FINAL  Final  Culture, blood (Routine X 2) w Reflex to ID Panel     Status: None   Collection Time: 08/04/20  1:00 PM   Specimen: BLOOD LEFT HAND  Result Value Ref Range Status   Specimen Description BLOOD LEFT HAND  Final   Special Requests   Final    BOTTLES DRAWN AEROBIC AND ANAEROBIC Blood Culture adequate volume   Culture   Final    NO GROWTH 5 DAYS Performed at [facility], [place] [address] [place], [place] [address]    Report Status 08/09/2020 FINAL  Final  Culture, blood (Routine X 2) w Reflex to ID Panel     Status: None   Collection Time: 08/04/20  1:00 PM   Specimen: BLOOD LEFT HAND  Result Value Ref Range Status   Specimen Description BLOOD LEFT HAND  Final   Special Requests   Final    BOTTLES DRAWN AEROBIC AND ANAEROBIC Blood Culture adequate volume   Culture   Final     NO GROWTH 5 DAYS Performed at [facility], [place] [address] [place], [place] [address]    Report Status 08/09/2020 FINAL  Final  MRSA PCR Screening     Status: None   Collection Time: 08/05/20  5:11 AM   Specimen: Nasopharyngeal  Result Value Ref Range Status   MRSA by PCR NEGATIVE NEGATIVE Final    Comment:        The GeneXpert MRSA Assay (FDA approved for NASAL specimens only), is one component of a comprehensive MRSA colonization surveillance program. It is not intended to diagnose MRSA infection nor to guide or monitor treatment for MRSA infections. Performed at [facility], [place] [address], [place], [place] [address]   SARS Coronavirus 2 by RT PCR (hospital order, performed in [facility] hospital lab) Nasopharyngeal Nasopharyngeal Swab     Status: None   Collection Time: 08/07/20  1:51 PM   Specimen: Nasopharyngeal Swab  Result Value Ref Range Status   SARS Coronavirus 2 NEGATIVE NEGATIVE Final    Comment: (NOTE) SARS-CoV-2 target nucleic acids are NOT DETECTED.  The SARS-CoV-2 RNA is generally detectable in upper and lower respiratory specimens during the acute phase of infection. The lowest concentration of SARS-CoV-2 viral copies this assay can detect is 250 copies / mL. A negative result does not preclude SARS-CoV-2 infection and should not be used as the sole basis for treatment or other patient management decisions.  A negative result may occur with improper specimen collection / handling, submission of specimen other than nasopharyngeal swab, presence of viral mutation(s) within the areas targeted by this assay, and inadequate number of viral copies (<250 copies / mL). A negative result must be combined with clinical observations, patient history, and epidemiological information.  Fact Sheet for Patients:   StrictlyIdeas.no  Fact Sheet for Healthcare Providers: BankingDealers.co.za  This test is not  yet approved or  cleared by the [place] FDA and has been authorized for detection and/or diagnosis of SARS-CoV-2 by FDA under an Emergency Use Authorization (EUA).  This EUA will remain in effect (meaning this test can be used) for the duration of the COVID-19 declaration under Section 564(b)(1) of the Act, 21 U.S.C. section 360bbb-3(b)(1), unless the authorization is terminated or revoked sooner.  Performed at [facility], [place] [address] [place], [place] [address]   Surgical PCR screen  Status: None   Collection Time: 08/09/20  5:03 AM   Specimen: Nasal Mucosa; Nasal Swab  Result Value Ref Range Status   MRSA, PCR NEGATIVE NEGATIVE Final   Staphylococcus aureus NEGATIVE NEGATIVE Final    Comment: (NOTE) The Xpert SA Assay (FDA approved for NASAL specimens in patients 40 years of age and older), is one component of a comprehensive surveillance program. It is not intended to diagnose infection nor to guide or monitor treatment. Performed at [facility], [place] [address], [place], [place] [address]     Impression/Plan:  1.  Gangrenous right lower extremity - for surgery today for planned TMA.    2.  MSSA bacteremia - repeat cultures from 1/27 remain ngtd, now final.  Continue with cefazolin.

## 2020-08-10 NOTE — Progress Notes (Signed)
Paged MD for hemoglobin of 6.8, 1 unit PRBC's ordered. While awaiting results of type and screen this RN flushed patient's IV, which was leaking and patient complaining of pain. IV removed and IV team consult placed STAT.

## 2020-08-10 NOTE — Progress Notes (Signed)
Will plan for TMA this afternoon.  Keep NPO   Dennis Macias

## 2020-08-10 NOTE — Progress Notes (Addendum)
[facility] Progress Note   Subjective:   Pt expresses frustration that TMA was postponed again. Appears it is now planned for this afternoon. He reports ongoing nausea, says it is worse when he cannot eat. Denies CP, palpitations, and SOB  Objective Vitals:   08/10/20 0424 08/10/20 0644 08/10/20 0700 08/10/20 0803  BP: (!) 115/57 113/69 139/69 137/73  Pulse: 72 78 78 74  Resp: 15 18 17 16   Temp: 98.8 F (37.1 C) 99.4 F (37.4 C) 99 F (37.2 C) 99.1 F (37.3 C)  TempSrc: Oral Oral Oral Oral  SpO2: 98% 100% 100% 99%  Weight:      Height:       Physical Exam  General:WDWN male, alert and in NAD Heart:RRR, no murmurs, rubs or gallops Lungs:CTA bilaterally without wheezing, rhonchi or rales Abdomen:Soft, non-distended, +BS Extremities:R foot with necrotic appearing 2nd toe. No LE edema. Dialysis Access:RUE AVF +bruit  Additional Objective Labs: Basic Metabolic Panel: Recent Labs  Lab 08/07/20 0425 08/08/20 0240 08/09/20 0205 08/10/20 0229  NA 138 139 138 139  K 4.0 4.3 4.2 4.1  CL 99 100 103 97*  CO2 26 24 19* 27  GLUCOSE 145* 124* 108* 120*  BUN 23 33* 41* 18  CREATININE 3.73* 4.78* 5.46* 3.48*  CALCIUM 8.1* 8.5* 8.3* 8.2*  PHOS 3.0  --  3.8 2.4*   Liver Function Tests: Recent Labs  Lab 08/04/20 0334 08/04/20 2018 08/09/20 0205 08/09/20 1121 08/10/20 0229  AST 22  --   --  17  --   ALT 10  --   --  <5  --   ALKPHOS 69  --   --  72  --   BILITOT 0.7  --   --  0.7  --   PROT 6.2*  --   --  6.0*  --   ALBUMIN 2.5*   < > 2.1* 2.0* 1.9*   < > = values in this interval not displayed.   No results for input(s): LIPASE, AMYLASE in the last 168 hours. CBC: Recent Labs  Lab 08/06/20 0332 08/07/20 0425 08/08/20 0240 08/09/20 0150 08/10/20 0229  WBC 12.6* 13.1* 14.2* 13.1* 16.6*  NEUTROABS 10.0* 10.8*  --  11.1*  --   HGB 7.8* 7.5* 7.5* 7.7* 6.8*  HCT 22.4* 22.8* 24.0* 23.8* 20.2*  MCV 88.9 90.8 94.9 97.5 92.7  PLT 310 340 383  418* 502*   Blood Culture    Component Value Date/Time   SDES BLOOD LEFT HAND 08/04/2020 1300   SDES BLOOD LEFT HAND 08/04/2020 1300   SPECREQUEST  08/04/2020 1300    BOTTLES DRAWN AEROBIC AND ANAEROBIC Blood Culture adequate volume   SPECREQUEST  08/04/2020 1300    BOTTLES DRAWN AEROBIC AND ANAEROBIC Blood Culture adequate volume   CULT  08/04/2020 1300    NO GROWTH 5 DAYS Performed at [facility], [place] [address] [place], [place] [address]    CULT  08/04/2020 1300    NO GROWTH 5 DAYS Performed at [facility], [place] [address] [place], [place] [address]    REPTSTATUS 08/09/2020 FINAL 08/04/2020 1300   REPTSTATUS 08/09/2020 FINAL 08/04/2020 1300    Cardiac Enzymes: No results for input(s): CKTOTAL, CKMB, CKMBINDEX, TROPONINI in the last 168 hours. CBG: Recent Labs  Lab 08/09/20 0748 08/09/20 1124 08/09/20 1642 08/09/20 2206 08/10/20 0810  GLUCAP 90 96 107* 77 103*   Iron Studies: No results for input(s): IRON, TIBC, TRANSFERRIN, FERRITIN in the last 72 hours. @  lablastinr3@ Studies/Results: ECHO TEE  Result Date: 08/08/2020    TRANSESOPHOGEAL ECHO REPORT   Patient Name:   Dennis Macias Date of Exam: 08/08/2020 Medical Rec #:  [id number]  Height:       72.0 in Accession #:    [id number] Weight:       150.4 lb Date of Birth:  August 04, 1950  BSA:          1.887 m Patient Age:    70 years   BP:           172/76 mmHg Patient Gender: M          HR:           85 bpm. Exam Location:  Inpatient Procedure: Transesophageal Echo, 3D Echo, Color Doppler, Cardiac Doppler and            Saline Contrast Bubble Study Indications:     Endocarditis  History:         Patient has prior history of Echocardiogram examinations. CHF;                  Risk Factors:Hypertension, Diabetes and Dyslipidemia.  Sonographer:     Raquel Sarna Senior RDCS Referring Phys:  [id number] Leanor Kail Diagnosing Phys: Cherlynn Kaiser MD PROCEDURE: After discussion of the risks and benefits of a TEE, an informed  consent was obtained from the patient. TEE procedure time was 17 minutes. The transesophogeal probe was passed without difficulty through the esophogus of the patient. Imaged were obtained with the patient in a left lateral decubitus position. Local oropharyngeal anesthetic was provided with Cetacaine. Sedation performed by different physician. The patient was monitored while under deep sedation. Anesthestetic sedation was provided intravenously by Anesthesiology: 226mg  of Propofol, 80mg  of Lidocaine. Image quality was good. The patient's vital signs; including heart rate, blood pressure, and oxygen saturation; remained stable throughout the procedure. The patient developed no complications during the procedure. IMPRESSIONS  1. Left ventricular ejection fraction, by estimation, is 60 to 65%. The left ventricle has normal function. The left ventricle has no regional wall motion abnormalities.  2. Right ventricular systolic function is normal. The right ventricular size is normal.  3. Left atrial size was mildly dilated. No left atrial/left atrial appendage thrombus was detected.  4. The mitral valve is normal in structure. Trivial mitral valve regurgitation. No evidence of mitral stenosis.  5. The aortic valve is normal in structure. Aortic valve regurgitation is not visualized. No aortic stenosis is present.  6. Aortic dilatation noted. There is borderline dilatation of the aortic root, measuring 38 mm. There is Moderate (Grade III) plaque involving the transverse and descending aorta.  7. Inadequate agitated saline injection to assess for interatrial shunt. Conclusion(s)/Recommendation(s): No evidence of vegetation/infective endocarditis on this transesophageal echocardiogram. FINDINGS  Left Ventricle: Left ventricular ejection fraction, by estimation, is 60 to 65%. The left ventricle has normal function. The left ventricle has no regional wall motion abnormalities. The left ventricular internal cavity size was  normal in size. There is  no left ventricular hypertrophy. Right Ventricle: The right ventricular size is normal. No increase in right ventricular wall thickness. Right ventricular systolic function is normal. Left Atrium: Left atrial size was mildly dilated. No left atrial/left atrial appendage thrombus was detected. Right Atrium: Right atrial size was normal in size. Pericardium: Trivial pericardial effusion is present. Mitral Valve: The mitral valve is normal in structure. Trivial mitral valve regurgitation. No evidence of mitral valve stenosis. Tricuspid Valve: The tricuspid valve is normal in  structure. Tricuspid valve regurgitation is trivial. No evidence of tricuspid stenosis. Aortic Valve: The aortic valve is normal in structure. Aortic valve regurgitation is not visualized. No aortic stenosis is present. Pulmonic Valve: The pulmonic valve was normal in structure. Pulmonic valve regurgitation is trivial. Aorta: Aortic dilatation noted. There is borderline dilatation of the aortic root, measuring 38 mm. There is moderate (Grade III) plaque involving the transverse and descending aorta. IAS/Shunts: No atrial level shunt detected by color flow Doppler. Agitated saline contrast was given intravenously to evaluate for intracardiac shunting. Inadequate agitated saline injection to assess for interatrial shunt.  AORTIC VALVE LVOT Vmax:   79.50 cm/s LVOT Vmean:  50.400 cm/s LVOT VTI:    0.119 m  SHUNTS Systemic VTI: 0.12 m Cherlynn Kaiser MD Electronically signed by Cherlynn Kaiser MD Signature Date/Time: 08/08/2020/3:48:55 PM    Final    Medications: . sodium chloride 250 mL (08/06/20 0342)  .  ceFAZolin (ANCEF) IV 1 g (08/09/20 1718)   . aspirin EC  81 mg Oral Daily  . atorvastatin  80 mg Oral QHS  . Chlorhexidine Gluconate Cloth  6 each Topical Q0600  . clopidogrel  75 mg Oral Daily  . darbepoetin (ARANESP) injection - DIALYSIS  40 mcg Intravenous Q Sat-HD  . docusate sodium  100 mg Oral Daily  .  dorzolamide-timolol  1 drop Right Eye BID  . finasteride  5 mg Oral Daily  . insulin aspart  0-6 Units Subcutaneous TID WC  . insulin glargine  7 Units Subcutaneous QHS  . mupirocin ointment  1 application Nasal BID  . pantoprazole  40 mg Oral Daily  . polyethylene glycol  17 g Oral Daily  . sodium chloride flush  3 mL Intravenous Q12H    Dialysis Orders: TTS -[facility] 4hrs, BFR400, [id number], EDW 69kg,2K/2.25Ca Access:RU AVF Heparin4000 unit bolus Hectorol4mcg IV qHD  Assessment/Plan: 1. Sepsis 2/2 MSSA bacteremia. ID consulted, TEE negative for endocarditis, going for  TMA today.Suspected source gangrenous toes.  2. Syncopal episode -likely multifactorial - acute CVA, hypotension, and acute on chronic anemia. Not volume overloaded.Minimal UF with HD. 3. Acute CVA- seen on MRI. Thought to be related to cerebral hypoperfusion with hypotension post HD, as this was the cause of a past stroke. Per neuro/Admit 4. R 1st & 2nd digit ulcerations/Hx PVD: S/p RLE balloon angioplasty to PTA 05/29/20.VVS consulted,wentfor arteriogram01/28/22.Planned for TMA today. 5. ESRD:Continue TTS schedule. HD treatments shortened due to high patient census/staffing shortage. Not uremic or volume overloaded onexam.  6. Hypertension/volume: BPmostlycontrolled.Belowdry wt with no volume overload on exam. Likely losing some weight with NPO status 7. Acute on chronic Anemiaof CKD:+ FOBT, Hgb drop 11.3 (on 1/13 as OP) to 7.4, s/p 1 unit pRBC1/20and another unit on 1/24.GI consulted,underwent EGD1/25which showed esophagitis.Hgb now6.8, PRBC already ordered.Continue ESA.  8. Secondary Hyperparathyroidism:Corrected calciumcontrolled, has been running high,Phoswas low andbinders discontinued, now slighlty low again at 2.4. Liberalize phos in diet once eating.Hectorolon hold, follow Ca. 9. DMT2   Anice Paganini, PA-C 08/10/2020, 9:09 AM  [facility] Pager: [phone]  I have seen and examined this patient and agree with plan and assessment in the above note with renal recommendations/intervention highlighted.  Plan for TMA later today. Will likely need another blood transfusion with HD tomorrow. Governor Rooks Beckett Maden,MD 08/10/2020 2:32 PM

## 2020-08-10 NOTE — Progress Notes (Signed)
The patient's transmetatarsal amputation was again canceled today because of significant nausea and vomiting.  I will place him back on the schedule for tomorrow.  He will be n.p.o. after midnight.  This was discussed with the primary team.  INR was down to 1.8 today.  He received vitamin K yesterday.  [name]

## 2020-08-10 NOTE — Progress Notes (Signed)
IMTS Progress Note:  Paged by RN that hemoglobin dropped from 7.5 to 6.8. Recently has had FOBT + stool and EGD showing esophagitis. Has only had a single, brown stool without notable bleeding anywhere overnight. Patient is asymptomatic.   A/P:   # Likely Slow Upper GI Bleed  - Ordered single pRBC transfusion  - Check post-transfusion H&H  Jeralyn Bennett, MD 08/10/2020, 5:02 AM Pager: [phone]

## 2020-08-10 NOTE — Progress Notes (Addendum)
HD#12 Subjective:  Overnight: Hgb dropped to 6.8 transfused with 1 unit pRBCs  Patient states that he only feels "fair" today. He reports a episode of emesis earlier today, unsure of bloody emesis. Denies abdominal pain or melena. Plan for TMA today, patient agreeable.   Objective:   Vital signs in last 24 hours: Vitals:   08/10/20 0326 08/10/20 0424 08/10/20 0644 08/10/20 0700  BP: 127/74 (!) 115/57 113/69 139/69  Pulse: 82 72 78 78  Resp: 18 15 18 17   Temp: 98.9 F (37.2 C) 98.8 F (37.1 C) 99.4 F (37.4 C) 99 F (37.2 C)  TempSrc: Oral Oral Oral Oral  SpO2: 98% 98% 100% 100%  Weight: 66.1 kg     Height:       Physical Exam Constitutional:chronically ill-appearingmanlying bed, in no acute distress Cardiovascular: regular rate and rhythm, no m/r/g Pulmonary/Chest: CTA, no respiratory distress. MSK: L BKA Skin:RLE warm, necrotic appearing 1st, 2nd, 3rd digits.   Pertinent Labs: CBC Latest Ref Rng & Units 08/10/2020 08/09/2020 08/08/2020  WBC 4.0 - 10.5 K/uL 16.6(H) 13.1(H) 14.2(H)  Hemoglobin 13.0 - 17.0 g/dL 6.8(LL) 7.7(L) 7.5(L)  Hematocrit 39.0 - 52.0 % 20.2(L) 23.8(L) 24.0(L)  Platelets 150 - 400 K/uL 502(H) 418(H) 383    CMP Latest Ref Rng & Units 08/10/2020 08/09/2020 08/08/2020  Glucose 70 - 99 mg/dL 120(H) 108(H) 124(H)  BUN 8 - 23 mg/dL 18 41(H) 33(H)  Creatinine 0.61 - 1.24 mg/dL 3.48(H) 5.46(H) 4.78(H)  Sodium 135 - 145 mmol/L 139 138 139  Potassium 3.5 - 5.1 mmol/L 4.1 4.2 4.3  Chloride 98 - 111 mmol/L 97(L) 103 100  CO2 22 - 32 mmol/L 27 19(L) 24  Calcium 8.9 - 10.3 mg/dL 8.2(L) 8.3(L) 8.5(L)  Total Protein 6.5 - 8.1 g/dL - 6.0(L) -  Total Bilirubin 0.3 - 1.2 mg/dL - 0.7 -  Alkaline Phos 38 - 126 U/L - 72 -  AST 15 - 41 U/L - 17 -  ALT 0 - 44 U/L - <5 -    Imaging: ECHO TEE  Result Date: 08/08/2020   IMPRESSIONS  1. Left ventricular ejection fraction, by estimation, is 60 to 65%. The left ventricle has normal function. The left ventricle has no  regional wall motion abnormalities.   2. Right ventricular systolic function is normal. The right ventricular size is normal.   3. Left atrial size was mildly dilated. No left atrial/left atrial appendage thrombus was detected.   4. The mitral valve is normal in structure. Trivial mitral valve regurgitation. No evidence of mitral stenosis.   5. The aortic valve is normal in structure. Aortic valve regurgitation is not visualized. No aortic stenosis is present.   6. Aortic dilatation noted. There is borderline dilatation of the aortic root, measuring 38 mm. There is Moderate (Grade III) plaque involving the transverse and descending aorta.   7. Inadequate agitated saline injection to assess for interatrial shunt. Conclusion(s)/Recommendation(s): No evidence of vegetation/infective endocarditis on this transesophageal echocardiogram.      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 man with history of ESRD on HD TTS, type 2 diabetes mellitus, history of CVA, hypertension, heart failure, and peripheral vascular disease on dual antiplatelet therapy who presented after syncopal episode at dialysis and admitted for MSSA bacteremia in the setting of R1st and F2nd digit ulcerations.   This is hospital day 12.  Sepsis secondary to MSSA bacteremia R 1st and 2nd digit ulcerations History of  Peripheral vascular disease s/p L BKA S/p angioplasty of the R posterior tibial artery POD 3 (1/28) Afebrile overnight.  Leukocytosis Improving 16.6>15.3. TMA this afternoon for source control - Vascular consulted, appreciate reacs - ID consulted, appreciate recs - IV cefazolin day 7 /14  (day 1: 1/27, last day 2/9), will not need prolonged course of antibiotics after surgery  - On dual antiplatelet platelet therapy due to PVD.   Elevated INR INR improved to 1.8 with Vitamin K. Not on anticoagulation. Differential includes liver disease, vitamin K deficiency, medication interaction with cefazolin although this is very rare. - CMP - hold heparin   Vomiting Patient with nausea and vomiting on exam today. Reports vomiting for the past couple of days Zofran not helping. RN has not noticed bloody or dark emesis. Prior with possibe QT prolongation, will obtain repeat. -repeat EKG - Switch to phenergan 12.5 mg q 6 PRN  - monitor CBC  Syncopal episode Post-HD hypotension ESRD on HD (TTS) Acute ischemic left thalamic infarct History of multiple prior CVAs (2017, 2021) HTN, HLD Patient has not had another episode of bradycardia/syncope since HD on Mon (1/24). Continue with HD on TTS schedule - Continue home ASA and Plavix - Continue atorvastatin 80 mg  - Continued HD while hospitalized. Appreciate nephrology's assistance.   Acute on chronic anemia FOBT positive Nonerosive esophagitis Hgb dropped to 6.8 this morning now 7.8 sp 1 unit RBCs. Per nurse stools appear dark. Patient notes today he has been vomiting for the past couple of days, no hematemasis. Currently on ASA and Plavix given recent left thalamic infarct will continue this. - Switch to IV Protonix 40 mg daily indefinitely - monitor CBC  Type 2 diabetes mellitus - Lantus 7 units - SSI sensitive - Carb/renal diet if not NPO  Diet: Carb/renal diet IVF: None VTE: SCDs Code: Full PT/OT recs: SNF for Subacute PT or HHPT if  patient refuses rehab placement   Please contact the on call pager after 5 pm and on weekends at [phone].  Iona Beard, MD PGY-1 Internal Medicine Teaching Service Pager: [phone] 08/10/2020

## 2020-08-10 NOTE — Progress Notes (Signed)
PT Cancellation Note  Patient Details Name: Dennis Macias MRN: [id number] DOB: October 26, 1950   Cancelled Treatment:    Reason Eval/Treat Not Completed: Patient declined, no reason specified. Pt declining treatment despite max encouragement and education. He reports he is nauseous and it is made worse by being NPO in preporation for possible TMA.    Allena Katz 08/10/2020, 3:18 PM

## 2020-08-10 NOTE — TOC Progression Note (Addendum)
Transition of Care [facility]) - Progression Note    Patient Details  Name: Dennis Macias MRN: [id number] Date of Birth: 09-Nov-1950  Transition of Care [facility]) CM/SW [place], [place] Phone Number: 08/10/2020, 11:50 AM  Clinical Narrative:     Update 2/2-CSW received call from patients significant other [facility] and she would like to review SNF bed offers and get back with SNF choice in the morning. CSW let patients significant other know that CSW will call her tomorrow morning and follow up.  CSW will continue to follow.  CSW spoke with patient at bedside and provided SNF bed offers. CSW gave patient medicare compare nursing home rating list of current SNF bed offers to review. Patient wants to review it with his friend when she stops by today. CSW let patient know she will follow up with him this afternoon.   CSW will continue to follow.    Expected Discharge Plan: [place] Barriers to Discharge: Continued Medical Work up  Expected Discharge Plan and Services Expected Discharge Plan: [place] arrangements for the past 2 months: [place] Expected Discharge Date: 08/03/20                         HH Arranged: PT,OT [place]: [place] Date [facility] Agency Contacted: 08/03/20   Representative spoke with at [place]: [place] ([place]) Interventions    Readmission Risk Interventions Readmission Risk Prevention Plan 01/19/2020 11/10/2019 08/10/2019  Transportation Screening Complete Complete Complete  PCP or Specialist Appt within 3-5 Days - - Complete  HRI or [place] - - Complete  Social Work Consult for [place] Planning/Counseling - - Complete  Palliative Care Screening - - Complete  Medication Review Press photographer) Complete Complete Complete  PCP or Specialist appointment within 3-5 days of discharge Complete - -  [place] or Home Care Consult Complete Complete -   SW Recovery Care/Counseling Consult Complete Complete -  Palliative Care Screening Complete Not Applicable -  [place] Complete Complete -  Some recent data might be hidden

## 2020-08-11 ENCOUNTER — Inpatient Hospital Stay (HOSPITAL_COMMUNITY): Payer: Medicare PPO | Admitting: Anesthesiology

## 2020-08-11 ENCOUNTER — Encounter (HOSPITAL_COMMUNITY): Payer: Self-pay | Admitting: Internal Medicine

## 2020-08-11 ENCOUNTER — Encounter (HOSPITAL_COMMUNITY)
Admission: EM | Disposition: A | Discharge status: Skilled Nursing Facility | Payer: Self-pay | Source: Home / Self Care | Attending: Internal Medicine

## 2020-08-11 DIAGNOSIS — I96 Gangrene, not elsewhere classified: Secondary | ICD-10-CM

## 2020-08-11 HISTORY — PX: TRANSMETATARSAL AMPUTATION: SHX6197

## 2020-08-11 LAB — CBC WITH DIFFERENTIAL/PLATELET
Abs Immature Granulocytes: 0.22 10*3/uL — ABNORMAL HIGH (ref 0.00–0.07)
Basophils Absolute: 0 10*3/uL (ref 0.0–0.1)
Basophils Relative: 0 %
Eosinophils Absolute: 0 10*3/uL (ref 0.0–0.5)
Eosinophils Relative: 0 %
HCT: 29.6 % — ABNORMAL LOW (ref 39.0–52.0)
Hemoglobin: 9.3 g/dL — ABNORMAL LOW (ref 13.0–17.0)
Immature Granulocytes: 1 %
Lymphocytes Relative: 11 %
Lymphs Abs: 1.8 10*3/uL (ref 0.7–4.0)
MCH: 29.2 pg (ref 26.0–34.0)
MCHC: 31.4 g/dL (ref 30.0–36.0)
MCV: 92.8 fL (ref 80.0–100.0)
Monocytes Absolute: 0.9 10*3/uL (ref 0.1–1.0)
Monocytes Relative: 6 %
Neutro Abs: 13.1 10*3/uL — ABNORMAL HIGH (ref 1.7–7.7)
Neutrophils Relative %: 82 %
Platelets: 480 10*3/uL — ABNORMAL HIGH (ref 150–400)
RBC: 3.19 MIL/uL — ABNORMAL LOW (ref 4.22–5.81)
RDW: 15.4 % (ref 11.5–15.5)
WBC: 16.1 10*3/uL — ABNORMAL HIGH (ref 4.0–10.5)
nRBC: 0 % (ref 0.0–0.2)

## 2020-08-11 LAB — CBC
HCT: 30.4 % — ABNORMAL LOW (ref 39.0–52.0)
Hemoglobin: 9.4 g/dL — ABNORMAL LOW (ref 13.0–17.0)
MCH: 29.3 pg (ref 26.0–34.0)
MCHC: 30.9 g/dL (ref 30.0–36.0)
MCV: 94.7 fL (ref 80.0–100.0)
Platelets: 568 10*3/uL — ABNORMAL HIGH (ref 150–400)
RBC: 3.21 MIL/uL — ABNORMAL LOW (ref 4.22–5.81)
RDW: 15.9 % — ABNORMAL HIGH (ref 11.5–15.5)
WBC: 16 10*3/uL — ABNORMAL HIGH (ref 4.0–10.5)
nRBC: 0 % (ref 0.0–0.2)

## 2020-08-11 LAB — TYPE AND SCREEN
ABO/RH(D): AB POS
Antibody Screen: NEGATIVE
Unit division: 0

## 2020-08-11 LAB — BPAM RBC
Blood Product Expiration Date: 202202082359
ISSUE DATE / TIME: 202202020638
Unit Type and Rh: 6200

## 2020-08-11 LAB — GLUCOSE, CAPILLARY
Glucose-Capillary: 128 mg/dL — ABNORMAL HIGH (ref 70–99)
Glucose-Capillary: 132 mg/dL — ABNORMAL HIGH (ref 70–99)
Glucose-Capillary: 131 mg/dL — ABNORMAL HIGH (ref 70–99)
Glucose-Capillary: 138 mg/dL — ABNORMAL HIGH (ref 70–99)
Glucose-Capillary: 124 mg/dL — ABNORMAL HIGH (ref 70–99)
Glucose-Capillary: 131 mg/dL — ABNORMAL HIGH (ref 70–99)

## 2020-08-11 LAB — COMPREHENSIVE METABOLIC PANEL WITH GFR
ALT: <5 U/L (ref 0–44)
AST: 17 U/L (ref 15–41)
Albumin: 1.9 g/dL — ABNORMAL LOW (ref 3.5–5.0)
Alkaline Phosphatase: 75 U/L (ref 38–126)
Anion gap: 16 — ABNORMAL HIGH (ref 5–15)
BUN: 30 mg/dL — ABNORMAL HIGH (ref 8–23)
CO2: 26 mmol/L (ref 22–32)
Calcium: 8.4 mg/dL — ABNORMAL LOW (ref 8.9–10.3)
Chloride: 97 mmol/L — ABNORMAL LOW (ref 98–111)
Creatinine, Ser: 5.34 mg/dL — ABNORMAL HIGH (ref 0.61–1.24)
GFR, Estimated: 11 mL/min — ABNORMAL LOW
Glucose, Bld: 128 mg/dL — ABNORMAL HIGH (ref 70–99)
Potassium: 4.3 mmol/L (ref 3.5–5.1)
Sodium: 139 mmol/L (ref 135–145)
Total Bilirubin: 1.1 mg/dL (ref 0.3–1.2)
Total Protein: 6.5 g/dL (ref 6.5–8.1)

## 2020-08-11 LAB — RENAL FUNCTION PANEL
Albumin: 1.9 g/dL — ABNORMAL LOW (ref 3.5–5.0)
Anion gap: 17 — ABNORMAL HIGH (ref 5–15)
BUN: 35 mg/dL — ABNORMAL HIGH (ref 8–23)
CO2: 24 mmol/L (ref 22–32)
Calcium: 8.3 mg/dL — ABNORMAL LOW (ref 8.9–10.3)
Chloride: 97 mmol/L — ABNORMAL LOW (ref 98–111)
Creatinine, Ser: 5.77 mg/dL — ABNORMAL HIGH (ref 0.61–1.24)
GFR, Estimated: 10 mL/min — ABNORMAL LOW (ref >60)
Glucose, Bld: 137 mg/dL — ABNORMAL HIGH (ref 70–99)
Phosphorus: 4.2 mg/dL (ref 2.5–4.6)
Potassium: 4.7 mmol/L (ref 3.5–5.1)
Sodium: 138 mmol/L (ref 135–145)

## 2020-08-11 LAB — PROTIME-INR
INR: 1.5 — ABNORMAL HIGH (ref 0.8–1.2)
Prothrombin Time: 17.8 s — ABNORMAL HIGH (ref 11.4–15.2)

## 2020-08-11 LAB — TROPONIN I (HIGH SENSITIVITY): Troponin I (High Sensitivity): 272 ng/L (ref <18)

## 2020-08-11 SURGERY — AMPUTATION, FOOT, TRANSMETATARSAL
Anesthesia: General | Site: Foot | Laterality: Right

## 2020-08-11 MED ORDER — LIDOCAINE 2% (20 MG/ML) 5 ML SYRINGE
INTRAMUSCULAR | Status: DC | PRN
  Administered 2020-08-11: 60 mg via INTRAVENOUS

## 2020-08-11 MED ORDER — MIDAZOLAM HCL 2 MG/2ML IJ SOLN: 1.0000 mg | Freq: Once | INTRAMUSCULAR | Status: AC

## 2020-08-11 MED ORDER — SODIUM CHLORIDE 0.9 % IV SOLN: INTRAVENOUS | Status: DC

## 2020-08-11 MED ORDER — PROPOFOL 10 MG/ML IV BOLUS
INTRAVENOUS | Status: DC | PRN
  Administered 2020-08-11: 120 mg via INTRAVENOUS

## 2020-08-11 MED ORDER — NITROGLYCERIN 0.4 MG SL SUBL
SUBLINGUAL_TABLET | SUBLINGUAL | Status: AC
  Administered 2020-08-11: 0.4 mg
  Filled 2020-08-11: qty 1

## 2020-08-11 MED ORDER — FENTANYL CITRATE (PF) 100 MCG/2ML IJ SOLN
INTRAMUSCULAR | Status: AC
  Filled 2020-08-11: qty 2

## 2020-08-11 MED ORDER — DEXAMETHASONE SODIUM PHOSPHATE 10 MG/ML IJ SOLN
INTRAMUSCULAR | Status: DC | PRN
  Administered 2020-08-11: 4 mg via INTRAVENOUS

## 2020-08-11 MED ORDER — CEFAZOLIN SODIUM-DEXTROSE 2-4 GM/100ML-% IV SOLN
INTRAVENOUS | Status: AC
  Filled 2020-08-11: qty 100

## 2020-08-11 MED ORDER — CHLORHEXIDINE GLUCONATE 0.12 % MT SOLN: 15.0000 mL | Freq: Once | OROMUCOSAL | Status: AC

## 2020-08-11 MED ORDER — MIDAZOLAM HCL 2 MG/2ML IJ SOLN
INTRAMUSCULAR | Status: AC
  Administered 2020-08-11: 1 mg via INTRAVENOUS
  Filled 2020-08-11: qty 2

## 2020-08-11 MED ORDER — 0.9 % SODIUM CHLORIDE (POUR BTL) OPTIME
TOPICAL | Status: DC | PRN
  Administered 2020-08-11: 1000 mL

## 2020-08-11 MED ORDER — FENTANYL CITRATE (PF) 100 MCG/2ML IJ SOLN: 50.0000 ug | Freq: Once | INTRAMUSCULAR | Status: AC

## 2020-08-11 MED ORDER — CISATRACURIUM BESYLATE 20 MG/10ML IV SOLN
INTRAVENOUS | Status: AC
  Filled 2020-08-11: qty 10

## 2020-08-11 MED ORDER — CEFAZOLIN SODIUM-DEXTROSE 2-3 GM-%(50ML) IV SOLR
INTRAVENOUS | Status: DC | PRN
  Administered 2020-08-11: 2 g via INTRAVENOUS

## 2020-08-11 MED ORDER — BUPIVACAINE-EPINEPHRINE (PF) 0.5% -1:200000 IJ SOLN
INTRAMUSCULAR | Status: DC | PRN
Start: 2020-08-11 — End: 2020-08-11
  Administered 2020-08-11: 25 mL via PERINEURAL
  Administered 2020-08-11: 15 mL via PERINEURAL

## 2020-08-11 MED ORDER — ONDANSETRON HCL 4 MG/2ML IJ SOLN: 4.0000 mg | Freq: Once | INTRAMUSCULAR | Status: DC | PRN

## 2020-08-11 MED ORDER — FENTANYL CITRATE (PF) 100 MCG/2ML IJ SOLN
INTRAMUSCULAR | Status: AC
  Administered 2020-08-11: 50 ug via INTRAVENOUS
  Filled 2020-08-11: qty 2

## 2020-08-11 MED ORDER — ONDANSETRON HCL 4 MG/2ML IJ SOLN
INTRAMUSCULAR | Status: DC | PRN
  Administered 2020-08-11: 4 mg via INTRAVENOUS

## 2020-08-11 MED ORDER — ORAL CARE MOUTH RINSE: 15.0000 mL | Freq: Once | OROMUCOSAL | Status: AC

## 2020-08-11 MED ORDER — FENTANYL CITRATE (PF) 100 MCG/2ML IJ SOLN: 25.0000 ug | INTRAMUSCULAR | Status: DC | PRN

## 2020-08-11 MED ORDER — CHLORHEXIDINE GLUCONATE 0.12 % MT SOLN
OROMUCOSAL | Status: AC
  Administered 2020-08-11: 15 mL via OROMUCOSAL
  Filled 2020-08-11: qty 15

## 2020-08-11 MED ORDER — FENTANYL CITRATE (PF) 250 MCG/5ML IJ SOLN
INTRAMUSCULAR | Status: DC | PRN
  Administered 2020-08-11: 50 ug via INTRAVENOUS

## 2020-08-11 SURGICAL SUPPLY — 31 items
BLADE AVERAGE 25X9 (BLADE) ×2 IMPLANT
BLADE SAW SGTL 81X20 HD (BLADE) ×2 IMPLANT
BNDG CONFORM 3 STRL LF (GAUZE/BANDAGES/DRESSINGS) IMPLANT
BNDG ELASTIC 4X5.8 VLCR STR LF (GAUZE/BANDAGES/DRESSINGS) ×2 IMPLANT
BNDG GAUZE ELAST 4 BULKY (GAUZE/BANDAGES/DRESSINGS) ×2 IMPLANT
CANISTER SUCT 3000ML PPV (MISCELLANEOUS) ×2 IMPLANT
COVER SURGICAL LIGHT HANDLE (MISCELLANEOUS) ×2 IMPLANT
COVER WAND RF STERILE (DRAPES) ×2 IMPLANT
DRAPE HALF SHEET 40X57 (DRAPES) ×2 IMPLANT
ELECT REM PT RETURN 9FT ADLT (ELECTROSURGICAL) ×2
ELECTRODE REM PT RTRN 9FT ADLT (ELECTROSURGICAL) ×1 IMPLANT
GAUZE SPONGE 4X4 12PLY STRL (GAUZE/BANDAGES/DRESSINGS) ×2 IMPLANT
GLOVE BIO SURGEON STRL SZ7.5 (GLOVE) ×2 IMPLANT
GOWN STRL REUS W/ TWL LRG LVL3 (GOWN DISPOSABLE) ×2 IMPLANT
GOWN STRL REUS W/ TWL XL LVL3 (GOWN DISPOSABLE) ×1 IMPLANT
GOWN STRL REUS W/TWL LRG LVL3 (GOWN DISPOSABLE) ×4
GOWN STRL REUS W/TWL XL LVL3 (GOWN DISPOSABLE) ×2
KIT BASIN OR (CUSTOM PROCEDURE TRAY) ×2 IMPLANT
KIT TURNOVER KIT B (KITS) ×2 IMPLANT
NEEDLE HYPO 25GX1X1/2 BEV (NEEDLE) IMPLANT
NS IRRIG 1000ML POUR BTL (IV SOLUTION) ×2 IMPLANT
PACK GENERAL/GYN (CUSTOM PROCEDURE TRAY) ×2 IMPLANT
PAD ARMBOARD 7.5X6 YLW CONV (MISCELLANEOUS) ×4 IMPLANT
SPECIMEN JAR SMALL (MISCELLANEOUS) ×2 IMPLANT
SUT ETHILON 3 0 PS 1 (SUTURE) ×2 IMPLANT
SUT VIC AB 2-0 CT1 18 (SUTURE) ×2 IMPLANT
SYR CONTROL 10ML LL (SYRINGE) IMPLANT
TOWEL GREEN STERILE (TOWEL DISPOSABLE) ×4 IMPLANT
TOWEL GREEN STERILE FF (TOWEL DISPOSABLE) ×2 IMPLANT
UNDERPAD 30X36 HEAVY ABSORB (UNDERPADS AND DIAPERS) ×2 IMPLANT
WATER STERILE IRR 1000ML POUR (IV SOLUTION) ×2 IMPLANT

## 2020-08-11 NOTE — Anesthesia Postprocedure Evaluation (Signed)
Anesthesia Post Note  Patient: Dennis Macias  Procedure(s) Performed: RIGHT TRANSMETATARSAL AMPUTATION (Right Foot)     Patient location during evaluation: PACU Anesthesia Type: General Level of consciousness: awake and alert Pain management: pain level controlled Vital Signs Assessment: post-procedure vital signs reviewed and stable Respiratory status: spontaneous breathing, nonlabored ventilation and respiratory function stable Cardiovascular status: blood pressure returned to baseline and stable Postop Assessment: no apparent nausea or vomiting Anesthetic complications: no   No complications documented.  Last Vitals:  Vitals:   08/11/20 1232 08/11/20 1247  BP: (!) 155/74 (!) 160/75  Pulse: 85 81  Resp: 10 17  Temp: 36.8 C   SpO2: 100% 100%    Last Pain:  Vitals:   08/11/20 1232  TempSrc:   PainSc: 0-No pain                 Audry Pili

## 2020-08-11 NOTE — Op Note (Signed)
    Patient name: Dennis Macias MRN: [id number] DOB: [birth year] Sex: male  08/11/2020 Pre-operative Diagnosis: Right foot wound, esrd Post-operative diagnosis:  Same Surgeon:  Eda Paschal. Dennis Matters, MD Assistant: Paulo Fruit, PA Procedure Performed:  Right transmetatarsal amputation  Indications: 70 year old male with a history of a left lower extremity amputation.  Recent underwent right posterior tibial revascularization is now indicated for transmetatarsal amputation.  An assistant was necessary to expedite the case.  Findings: There was purulence near the toes this was washed out.  The tissue over the dorsum of the foot appeared quite pale and there was minimal bleeding in the tissue.  Blood vessels were heavily calcified with minimal bleeding.  The plantar aspect of the foot did appear healthy.  Patient will be high risk for below-knee amputation on the right.    Procedure:  The patient was identified in the holding area and taken to the operating was placed supine on operative table and LMA anesthesia induced.  Preoperative block of been placed.  He was sterilely prepped and draped in the right lower extremity usual fashion antibiotics were ministered a timeout was called.  [place] type incision was made on his foot.  We did not encounter pus distally.  We dissected down to the bones we elevated the tissue and the periosteum on the dorsum of the foot.  The bones were transected with power saw.  We used a knife to create anterior posterior flaps.  Unfortunately there was minimal bleeding there were some very heavily calcified blood vessels that did bleed.  This was controlled with suture ligation.  The dorsum of the foot appeared quite pale.  We thoroughly irrigated the wound.  We reapproximated the flaps with interrupted 3-0 nylon suture.  A sterile dressing was placed.  She was awakened from anesthesia having tolerated procedure without any complication.  All counts were correct at  completion.  EBL: 25 cc   Anmol Fleck C. Dennis Matters, MD [facility] Office: [phone] Pager: [phone]

## 2020-08-11 NOTE — Progress Notes (Signed)
  Progress Note    08/11/2020 10:54 AM Day of Surgery  Subjective: no new issues  Vitals:   08/11/20 0428 08/11/20 0923  BP: (!) 153/76 116/67  Pulse: 84 79  Resp: 16 14  Temp: 99.2 F (37.3 C) 99.5 F (37.5 C)  SpO2: 99% 100%    Physical Exam: Awake and alert      CBC    Component Value Date/Time   WBC 16.0 (H) 08/11/2020 0202   RBC 3.21 (L) 08/11/2020 0202   HGB 9.4 (L) 08/11/2020 0202   HGB 11.1 (L) 05/02/2020 1454   HCT 30.4 (L) 08/11/2020 0202   HCT 35.5 (L) 05/02/2020 1454   PLT 568 (H) 08/11/2020 0202   PLT 221 05/02/2020 1454   MCV 94.7 08/11/2020 0202   MCV 86 05/02/2020 1454   MCH 29.3 08/11/2020 0202   MCHC 30.9 08/11/2020 0202   RDW 15.9 (H) 08/11/2020 0202   RDW 15.7 (H) 05/02/2020 1454   LYMPHSABS 1.2 08/09/2020 0150   LYMPHSABS 1.3 05/02/2020 1454   MONOABS 0.7 08/09/2020 0150   EOSABS 0.0 08/09/2020 0150   EOSABS 0.0 05/02/2020 1454   BASOSABS 0.0 08/09/2020 0150   BASOSABS 0.0 05/02/2020 1454    BMET    Component Value Date/Time   NA 138 08/11/2020 0853   NA 137 12/10/2019 1439   K 4.7 08/11/2020 0853   CL 97 (L) 08/11/2020 0853   CO2 24 08/11/2020 0853   GLUCOSE 137 (H) 08/11/2020 0853   BUN 35 (H) 08/11/2020 0853   BUN 80 (HH) 12/10/2019 1439   CREATININE 5.77 (H) 08/11/2020 0853   CALCIUM 8.3 (L) 08/11/2020 0853   GFRNONAA 10 (L) 08/11/2020 0853   GFRAA 19 (L) 04/05/2020 0705    INR    Component Value Date/Time   INR 1.5 (H) 08/11/2020 0202     Intake/Output Summary (Last 24 hours) at 08/11/2020 1054 Last data filed at 08/11/2020 2774 Gross per 24 hour  Intake 0 ml  Output --  Net 0 ml     Assessment/plan:  70 y.o. male is pta of R PT artery. Plan for R tma today in OR.    Ramere Downs C. Donzetta Matters, MD [facility] Office: [phone] Pager: [phone]  08/11/2020 10:54 AM

## 2020-08-11 NOTE — Anesthesia Procedure Notes (Signed)
Procedure Name: LMA Insertion Performed by: Cleda Daub, CRNA Pre-anesthesia Checklist: Patient identified, Emergency Drugs available, Suction available and Patient being monitored Patient Re-evaluated:Patient Re-evaluated prior to induction Oxygen Delivery Method: Circle system utilized Preoxygenation: Pre-oxygenation with 100% oxygen Induction Type: IV induction LMA: LMA inserted LMA Size: 4.0 Number of attempts: 1 Placement Confirmation: positive ETCO2 Tube secured with: Tape Dental Injury: Teeth and Oropharynx as per pre-operative assessment

## 2020-08-11 NOTE — Progress Notes (Signed)
PT Cancellation Note  Patient Details Name: Dennis Macias MRN: [id number] DOB: [birth year]   Cancelled Treatment:    Reason Eval/Treat Not Completed: (P) Patient at procedure or test/unavailable (pt at OR for TMA.) Will continue efforts per PT POC as schedule permits.   [place] 08/11/2020, 11:15 AM

## 2020-08-11 NOTE — Progress Notes (Signed)
OT Cancellation Note  Patient Details Name: Ettore Trebilcock MRN: [id number] DOB: 04-28-1951   Cancelled Treatment:    Reason Eval/Treat Not Completed: Patient at procedure or test/ unavailable (Pt in surgery.)  [name] 08/11/2020, 10:05 AM  Nestor Lewandowsky, OTR/L Acute Rehabilitation Services Pager: [phone] Office: [phone]

## 2020-08-11 NOTE — Anesthesia Procedure Notes (Signed)
Anesthesia Regional Block: Popliteal block   Pre-Anesthetic Checklist: ,, timeout performed, Correct Patient, Correct Site, Correct Laterality, Correct Procedure, Correct Position, site marked, Risks and benefits discussed,  Surgical consent,  Pre-op evaluation,  At surgeon's request and post-op pain management  Laterality: Right  Prep: chloraprep       Needles:  Injection technique: Single-shot  Needle Type: Echogenic Needle     Needle Length: 10cm  Needle Gauge: 21     Additional Needles:   Narrative:  Start time: 08/11/2020 10:45 AM End time: 08/11/2020 10:49 AM Injection made incrementally with aspirations every 5 mL.  Performed by: Personally  Anesthesiologist: Audry Pili, MD  Additional Notes: No pain on injection. No increased resistance to injection. Injection made in 5cc increments. Good needle visualization. Patient tolerated the procedure well.

## 2020-08-11 NOTE — Anesthesia Preprocedure Evaluation (Addendum)
Anesthesia Evaluation  Patient identified by MRN, date of birth, ID band Patient awake    Reviewed: Allergy & Precautions, NPO status , Patient's Chart, lab work & pertinent test results  Airway Mallampati: II  TM Distance: >3 FB Neck ROM: Full    Dental  (+) Edentulous Upper   Pulmonary former smoker,    Pulmonary exam normal        Cardiovascular hypertension, Pt. on medications and Pt. on home beta blockers + CAD, + Past MI, + Peripheral Vascular Disease and +CHF  Normal cardiovascular exam   '22 TEE - EF 60 to 65%. Left atrial size was mildly dilated. Trivial mitral valve regurgitation. There is borderline dilatation of the aortic root, measuring 38 mm. There is Moderate (Grade III) plaque involving the transverse and descending aorta.     Neuro/Psych CVA (2017), No Residual Symptoms negative psych ROS   GI/Hepatic Neg liver ROS, GERD  Controlled and Medicated,GIB has been having dark stools and requiring blood, last unit yesterday for Hb 6.8 Chronic N/V   Endo/Other  diabetes, Type 2, Insulin Dependent  Renal/GU ESRF and DialysisRenal diseaseK 4.3 this morning HD T/Th/Sat     Musculoskeletal  (+) Arthritis , Osteoarthritis,  Bacteremia 2/2 right foot gangrene   Abdominal   Peds  Hematology  (+) Blood dyscrasia, anemia , H/ H 9.7/30.7 today  INR initially high on admission, down to 1.5 today   Anesthesia Other Findings Blind R eye  Covid test negative   Reproductive/Obstetrics                           Anesthesia Physical Anesthesia Plan  ASA: III  Anesthesia Plan: General   Post-op Pain Management:  Regional for Post-op pain   Induction: Intravenous, Rapid sequence and Cricoid pressure planned  PONV Risk Score and Plan: 2 and Ondansetron, Dexamethasone and Treatment may vary due to age or medical condition  Airway Management Planned: LMA  Additional Equipment:  None  Intra-op Plan:   Post-operative Plan: Extubation in OR  Informed Consent: I have reviewed the patients History and Physical, chart, labs and discussed the procedure including the risks, benefits and alternatives for the proposed anesthesia with the patient or authorized representative who has indicated his/her understanding and acceptance.     Dental advisory given  Plan Discussed with: Anesthesiologist, CRNA and Surgeon  Anesthesia Plan Comments:       Anesthesia Quick Evaluation

## 2020-08-11 NOTE — Progress Notes (Addendum)
[facility] Progress Note   Subjective:   Surgery postponed again yesterday due to nausea. Pt feeling better today, no SOB, CP, palpitations, abdominal pain and nausea.   Objective Vitals:   08/10/20 1000 08/10/20 1952 08/11/20 0137 08/11/20 0428  BP: (!) 143/70 133/70 129/72 (!) 153/76  Pulse: 73 72 81 84  Resp:  18 17 16   Temp:  99.5 F (37.5 C) 99.4 F (37.4 C) 99.2 F (37.3 C)  TempSrc:  Oral Oral Oral  SpO2: 98% 99% 98% 99%  Weight:    63.5 kg  Height:       Physical Exam General:WDWN male, alert and in NAD Heart:RRR, no murmurs, rubs or gallops Lungs:CTA bilaterally without wheezing, rhonchi or rales Abdomen:Soft, non-distended, +BS Extremities:R foot with necrotic appearing 2nd toe. No LE edema. Dialysis Access:RUE AVF +bruit  Additional Objective Labs: Basic Metabolic Panel: Recent Labs  Lab 08/07/20 0425 08/08/20 0240 08/09/20 0205 08/10/20 0229 08/11/20 0202  NA 138   < > 138 139 139  K 4.0   < > 4.2 4.1 4.3  CL 99   < > 103 97* 97*  CO2 26   < > 19* 27 26  GLUCOSE 145*   < > 108* 120* 128*  BUN 23   < > 41* 18 30*  CREATININE 3.73*   < > 5.46* 3.48* 5.34*  CALCIUM 8.1*   < > 8.3* 8.2* 8.4*  PHOS 3.0  --  3.8 2.4*  --    < > = values in this interval not displayed.   Liver Function Tests: Recent Labs  Lab 08/09/20 1121 08/10/20 0229 08/11/20 0202  AST 17  --  17  ALT <5  --  <5  ALKPHOS 72  --  75  BILITOT 0.7  --  1.1  PROT 6.0*  --  6.5  ALBUMIN 2.0* 1.9* 1.9*   No results for input(s): LIPASE, AMYLASE in the last 168 hours. CBC: Recent Labs  Lab 08/06/20 0332 08/07/20 0425 08/08/20 0240 08/09/20 0150 08/10/20 0229 08/10/20 1100 08/10/20 1930 08/11/20 0202  WBC 12.6* 13.1*   < > 13.1* 16.6* 15.3* 18.3* 16.0*  NEUTROABS 10.0* 10.8*  --  11.1*  --   --   --   --   HGB 7.8* 7.5*   < > 7.7* 6.8* 7.8* 9.7* 9.4*  HCT 22.4* 22.8*   < > 23.8* 20.2* 25.3* 30.7* 30.4*  MCV 88.9 90.8   < > 97.5 92.7 95.5 92.2 94.7   PLT 310 340   < > 418* 502* 497* 512* 568*   < > = values in this interval not displayed.   Blood Culture    Component Value Date/Time   SDES BLOOD LEFT HAND 08/04/2020 1300   SDES BLOOD LEFT HAND 08/04/2020 1300   SPECREQUEST  08/04/2020 1300    BOTTLES DRAWN AEROBIC AND ANAEROBIC Blood Culture adequate volume   SPECREQUEST  08/04/2020 1300    BOTTLES DRAWN AEROBIC AND ANAEROBIC Blood Culture adequate volume   CULT  08/04/2020 1300    NO GROWTH 5 DAYS Performed at [facility], [place] [address] [place], [place] [address]    CULT  08/04/2020 1300    NO GROWTH 5 DAYS Performed at [facility], [place] [address] [place], [place] [address]    REPTSTATUS 08/09/2020 FINAL 08/04/2020 1300   REPTSTATUS 08/09/2020 FINAL 08/04/2020 1300    Cardiac Enzymes: No results for input(s): CKTOTAL, CKMB, CKMBINDEX, TROPONINI in the last 168 hours.  CBG: Recent Labs  Lab 08/10/20 0810 08/10/20 1211 08/10/20 1616 08/10/20 2117 08/11/20 0750  GLUCAP 103* 113* 118* 111* 128*   Iron Studies: No results for input(s): IRON, TIBC, TRANSFERRIN, FERRITIN in the last 72 hours. @lablastinr3 @ Studies/Results: No results found. Medications: . sodium chloride 250 mL (08/06/20 0342)  .  ceFAZolin (ANCEF) IV 1 g (08/09/20 1718)   . aspirin EC  81 mg Oral Daily  . atorvastatin  80 mg Oral QHS  . Chlorhexidine Gluconate Cloth  6 each Topical Q0600  . clopidogrel  75 mg Oral Daily  . darbepoetin (ARANESP) injection - DIALYSIS  40 mcg Intravenous Q Sat-HD  . docusate sodium  100 mg Oral Daily  . dorzolamide-timolol  1 drop Right Eye BID  . finasteride  5 mg Oral Daily  . insulin aspart  0-6 Units Subcutaneous TID WC  . insulin glargine  7 Units Subcutaneous QHS  . mupirocin ointment  1 application Nasal BID  . pantoprazole (PROTONIX) IV  40 mg Intravenous QHS  . polyethylene glycol  17 g Oral Daily  . sodium chloride flush  3 mL Intravenous Q12H    Dialysis Orders: TTS -[facility] 4hrs, BFR400, [id number], EDW 69kg,2K/2.25Ca Access:RU AVF Heparin4000 unit bolus Hectorol2mcg IV qHD  Assessment/Plan:  1. Sepsis 2/2 MSSA bacteremia. ID consulted,TEE negative for endocarditis,going for TMA today.Suspected source gangrenous toes.  2. Syncopal episode -likely multifactorial - acute CVA, hypotension, and acute on chronic anemia. Not volume overloaded.No further syncope. Minimal UF with HD. 3. Acute CVA- seen on MRI. Thought to be related to cerebral hypoperfusion with hypotension post HD, as this was the cause of a past stroke. Per neuro/Admit 4. R 1st & 2nd digit ulcerations/Hx PVD: S/p RLE balloon angioplasty to PTA 05/29/20.VVS consulted,wentfor arteriogram01/28/22.Surgery postponed several times but planned for TMA today. 5. ESRD:Continue TTS schedule. HD treatments shortened due to high patient census/staffing shortage. Not uremic or volume overloaded onexam.  6. Hypertension/volume: BPmostlycontrolled.Belowdry wt with no volume overload on exam. Likely losing some weight with NPO status 7. Acute on chronic Anemiaof CKD:+ FOBT, Hgb drop 11.3 (on 1/13 as OP) to 7.4, s/p 1 unit pRBC1/20and another unit on 1/24.GI consulted,underwent EGD1/25which showed esophagitis.Hgb now9.4.Marland KitchenContinue ESA.  8. Secondary Hyperparathyroidism:Corrected calciumcontrolled, has been running high,Phoswas low andbinders discontinued, now slighlty low again at 2.4. Liberalize phos in diet once eating.Hectorolon hold, follow Ca. 9. DMT2: per admitting team   Anice Paganini, PA-C 08/11/2020, 8:46 AM  [facility] Pager: [phone]  I have seen and examined this patient and agree with plan and assessment in the above note with renal recommendations/intervention highlighted.  Plan for TMA today per VVS.  May need to alter his HD schedule pending surgery.  Governor Rooks Jayko Voorhees,MD 08/11/2020 12:48 PM

## 2020-08-11 NOTE — Transfer of Care (Signed)
Immediate Anesthesia Transfer of Care Note  Patient: Ryu Cerreta  Procedure(s) Performed: RIGHT TRANSMETATARSAL AMPUTATION (Right Foot)  Patient Location: PACU  Anesthesia Type:General  Level of Consciousness: drowsy and patient cooperative  Airway & Oxygen Therapy: Patient Spontanous Breathing and Patient connected to face mask oxygen  Post-op Assessment: Report given to RN and Post -op Vital signs reviewed and stable  Post vital signs: Reviewed and stable  Last Vitals:  Vitals Value Taken Time  BP 155/74 08/11/20 1232  Temp    Pulse    Resp 12 08/11/20 1234  SpO2    Vitals shown include unvalidated device data.  Last Pain:  Vitals:   08/11/20 0923  TempSrc: Oral  PainSc: 0-No pain      Patients Stated Pain Goal: 0 (79/03/83 3383)  Complications: No complications documented.

## 2020-08-11 NOTE — Anesthesia Procedure Notes (Signed)
Anesthesia Regional Block: Adductor canal block   Pre-Anesthetic Checklist: ,, timeout performed, Correct Patient, Correct Site, Correct Laterality, Correct Procedure, Correct Position, site marked, Risks and benefits discussed,  Surgical consent,  Pre-op evaluation,  At surgeon's request and post-op pain management  Laterality: Right  Prep: chloraprep       Needles:  Injection technique: Single-shot  Needle Type: Echogenic Needle     Needle Length: 10cm  Needle Gauge: 21     Additional Needles:   Narrative:  Start time: 08/11/2020 10:40 AM End time: 08/11/2020 10:43 AM Injection made incrementally with aspirations every 5 mL.  Performed by: Personally  Anesthesiologist: Audry Pili, MD  Additional Notes: No pain on injection. No increased resistance to injection. Injection made in 5cc increments. Good needle visualization. Patient tolerated the procedure well.

## 2020-08-11 NOTE — Progress Notes (Signed)
HD#13 Subjective:  Overnight: None  Feeling well this morning denies vomiting and nausea today. Patient about to be taken to surgery this morning.   Objective:   Vital signs in last 24 hours: Vitals:   08/10/20 1000 08/10/20 1952 08/11/20 0137 08/11/20 0428  BP: (!) 143/70 133/70 129/72 (!) 153/76  Pulse: 73 72 81 84  Resp:  18 17 16   Temp:  99.5 F (37.5 C) 99.4 F (37.4 C) 99.2 F (37.3 C)  TempSrc:  Oral Oral Oral  SpO2: 98% 99% 98% 99%  Weight:    63.5 kg  Height:       Physical Exam Constitutional:manlying bed, in no acute distress, appears comfortable Cardiovascular: RRR, no murmurs Pulmonary/Chest: CTA, no respiratory distress. MSK: L BKA Skin:RLE warm, necrotic appearing 1st, 2nd, 3rd digits.   Pertinent Labs: CBC Latest Ref Rng & Units 08/10/2020 08/10/2020 08/10/2020  WBC 4.0 - 10.5 K/uL 18.3(H) 15.3(H) 16.6(H)  Hemoglobin 13.0 - 17.0 g/dL 9.7(L) 7.8(L) 6.8(LL)  Hematocrit 39.0 - 52.0 % 30.7(L) 25.3(L) 20.2(L)  Platelets 150 - 400 K/uL 512(H) 497(H) 502(H)    CMP Latest Ref Rng & Units 08/11/2020 08/10/2020 08/09/2020  Glucose 70 - 99 mg/dL 128(H) 120(H) 108(H)  BUN 8 - 23 mg/dL 30(H) 18 41(H)  Creatinine 0.61 - 1.24 mg/dL 5.34(H) 3.48(H) 5.46(H)  Sodium 135 - 145 mmol/L 139 139 138  Potassium 3.5 - 5.1 mmol/L 4.3 4.1 4.2  Chloride 98 - 111 mmol/L 97(L) 97(L) 103  CO2 22 - 32 mmol/L 26 27 19(L)  Calcium 8.9 - 10.3 mg/dL 8.4(L) 8.2(L) 8.3(L)  Total Protein 6.5 - 8.1 g/dL 6.5 - 6.0(L)  Total Bilirubin 0.3 - 1.2 mg/dL 1.1 - 0.7  Alkaline Phos 38 - 126 U/L 75 - 72  AST 15 - 41 U/L 17 - 17  ALT 0 - 44 U/L <5 - <5    Imaging: ECHO TEE  Result Date: 08/08/2020   IMPRESSIONS  1. Left ventricular ejection fraction, by estimation, is 60 to 65%. The left ventricle has normal function. The left ventricle has no regional wall motion abnormalities.   2. Right ventricular systolic function is normal. The right ventricular size is normal.   3. Left atrial size was  mildly dilated. No left atrial/left atrial appendage thrombus was detected.   4. The mitral valve is normal in structure. Trivial mitral valve regurgitation. No evidence of mitral stenosis.   5. The aortic valve is normal in structure. Aortic valve regurgitation is not visualized. No aortic stenosis is present.   6. Aortic dilatation noted. There is borderline dilatation of the aortic root, measuring 38 mm. There is Moderate (Grade III) plaque involving the transverse and descending aorta.   7. Inadequate agitated saline injection to assess for interatrial shunt. Conclusion(s)/Recommendation(s): No evidence of vegetation/infective endocarditis on this transesophageal echocardiogram.      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 man with history of ESRD on HD TTS, type 2 diabetes mellitus, history of CVA, hypertension, heart failure, and peripheral vascular disease on dual antiplatelet therapy who presented after syncopal episode at dialysis and admitted for MSSA bacteremia in the setting of R1st and F2nd digit ulcerations.   This is hospital day 13.  Sepsis secondary to MSSA bacteremia R 1st and 2nd digit ulcerations History of Peripheral vascular disease s/p L BKA S/p angioplasty of the R posterior tibial artery POD 3 (1/28) Patient feeling well, vomiting resolved. HD stable. INR  1.5 today. Did not get surgery yesterday due to n/v. Schedule for TMA this morning.  - Vascular consulted, appreciate reacs - ID consulted, appreciate recs - IV cefazolin day 8 /14  (day 1: 1/27, last day 2/9), plan to complete 2 weeks through HD - On dual antiplatelet platelet therapy due to PVD.   Syncopal episode Post-HD hypotension ESRD on HD (TTS) Acute ischemic left thalamic infarct History of multiple prior CVAs (2017, 2021) HTN, HLD No further syncope. Getting shorten sessions of HD. Continue with HD on TTS schedule - HD today - Continue ASA and Plavix - Continue atorvastatin 80 mg  - Continued HD while hospitalized. Appreciate nephrology's assistance.   Acute on chronic anemia FOBT positive Nonerosive esophagitis Hgb of 9.4, stable - Protonix 40 mg daily  - monitor CBC  Type 2 diabetes mellitus - Lantus 7 units - SSI sensitive - Carb/renal diet if not NPO  Diet: NPO IVF: None VTE: SCDs Code: Full PT/OT recs: SNF for Subacute PT or HHPT if patient refuses rehab placement  Please contact the on call pager after 5 pm and on weekends at [phone].  Iona Beard, MD PGY-1 Internal Medicine Teaching Service Pager: [phone] 08/11/2020

## 2020-08-11 NOTE — TOC Progression Note (Signed)
Transition of Care [facility]) - Progression Note    Patient Details  Name: Dennis Macias MRN: [id number] Date of Birth: [birth year]  Transition of Care [facility]) CM/SW [place], [place] Phone Number: 08/11/2020, 2:43 PM  Clinical Narrative:     CSW spoke with patients significant other Dennis Macias and discussed SNF bed offers. Patients significant other has chosen SNF placement at [facility] for patient.  CSW confirmed with Dennis Macias with [facility] that she can accept patient for SNF placement when medically ready. CSW will start insurance authorization for patient closer to patient being medically ready. CSW informed renal navigator.  CSW will continue to follow.    Expected Discharge Plan: [place] Barriers to Discharge: Continued Medical Work up  Expected Discharge Plan and Services Expected Discharge Plan: [place] arrangements for the past 2 months: [place] Expected Discharge Date: 08/03/20                         HH Arranged: PT,OT [place]: [place] Date [facility] Agency Contacted: 08/03/20   Representative spoke with at [place]: [place] ([place]) Interventions    Readmission Risk Interventions Readmission Risk Prevention Plan 01/19/2020 11/10/2019 08/10/2019  Transportation Screening Complete Complete Complete  PCP or Specialist Appt within 3-5 Days - - Complete  HRI or [place] - - Complete  Social Work Consult for [place] Planning/Counseling - - Complete  Palliative Care Screening - - Complete  Medication Review Press photographer) Complete Complete Complete  PCP or Specialist appointment within 3-5 days of discharge Complete - -  [place] or Home Care Consult Complete Complete -  SW Recovery Care/Counseling Consult Complete Complete -  Palliative Care Screening Complete Not Applicable -  [place] Complete Complete -  Some recent data might be hidden

## 2020-08-12 ENCOUNTER — Encounter (HOSPITAL_COMMUNITY): Payer: Self-pay | Admitting: Vascular Surgery

## 2020-08-12 DIAGNOSIS — B9561 Methicillin susceptible Staphylococcus aureus infection as the cause of diseases classified elsewhere: Secondary | ICD-10-CM | POA: Diagnosis not present

## 2020-08-12 DIAGNOSIS — R079 Chest pain, unspecified: Secondary | ICD-10-CM

## 2020-08-12 DIAGNOSIS — R7881 Bacteremia: Secondary | ICD-10-CM | POA: Diagnosis not present

## 2020-08-12 LAB — CBC
HCT: 28 % — ABNORMAL LOW (ref 39.0–52.0)
Hemoglobin: 9.3 g/dL — ABNORMAL LOW (ref 13.0–17.0)
MCH: 30.6 pg (ref 26.0–34.0)
MCHC: 33.2 g/dL (ref 30.0–36.0)
MCV: 92.1 fL (ref 80.0–100.0)
Platelets: 447 10*3/uL — ABNORMAL HIGH (ref 150–400)
RBC: 3.04 MIL/uL — ABNORMAL LOW (ref 4.22–5.81)
RDW: 15.5 % (ref 11.5–15.5)
WBC: 15.7 10*3/uL — ABNORMAL HIGH (ref 4.0–10.5)
nRBC: 0 % (ref 0.0–0.2)

## 2020-08-12 LAB — GLUCOSE, CAPILLARY
Glucose-Capillary: 113 mg/dL — ABNORMAL HIGH (ref 70–99)
Glucose-Capillary: 104 mg/dL — ABNORMAL HIGH (ref 70–99)
Glucose-Capillary: 105 mg/dL — ABNORMAL HIGH (ref 70–99)
Glucose-Capillary: 134 mg/dL — ABNORMAL HIGH (ref 70–99)

## 2020-08-12 LAB — TROPONIN I (HIGH SENSITIVITY)
Troponin I (High Sensitivity): 254 ng/L (ref <18)
Troponin I (High Sensitivity): 300 ng/L (ref <18)

## 2020-08-12 LAB — BASIC METABOLIC PANEL
Anion gap: 16 — ABNORMAL HIGH (ref 5–15)
BUN: 18 mg/dL (ref 8–23)
CO2: 24 mmol/L (ref 22–32)
Calcium: 8.2 mg/dL — ABNORMAL LOW (ref 8.9–10.3)
Chloride: 98 mmol/L (ref 98–111)
Creatinine, Ser: 3.87 mg/dL — ABNORMAL HIGH (ref 0.61–1.24)
GFR, Estimated: 16 mL/min — ABNORMAL LOW (ref >60)
Glucose, Bld: 120 mg/dL — ABNORMAL HIGH (ref 70–99)
Potassium: 4.6 mmol/L (ref 3.5–5.1)
Sodium: 138 mmol/L (ref 135–145)

## 2020-08-12 MED ORDER — CHLORHEXIDINE GLUCONATE CLOTH 2 % EX PADS
6.0000 | MEDICATED_PAD | Freq: Every day | CUTANEOUS | Status: DC
  Administered 2020-08-12 – 2020-08-13 (×2): 6 via TOPICAL

## 2020-08-12 MED ORDER — HEPARIN SODIUM (PORCINE) 5000 UNIT/ML IJ SOLN
5000.0000 [IU] | Freq: Three times a day (TID) | INTRAMUSCULAR | Status: DC
  Administered 2020-08-12 – 2020-08-19 (×19): 5000 [IU] via SUBCUTANEOUS
  Filled 2020-08-12 (×18): qty 1

## 2020-08-12 MED ORDER — HEPARIN SODIUM (PORCINE) 5000 UNIT/ML IJ SOLN
INTRAMUSCULAR | Status: AC
  Filled 2020-08-12: qty 1

## 2020-08-12 MED ORDER — HYDROMORPHONE HCL 1 MG/ML IJ SOLN
0.5000 mg | INTRAMUSCULAR | Status: DC | PRN
  Administered 2020-08-12 – 2020-08-16 (×7): 0.5 mg via INTRAVENOUS
  Filled 2020-08-12 (×7): qty 1

## 2020-08-12 NOTE — Progress Notes (Signed)
CRITICAL VALUE ALERT  Critical value received: troponin level  Date of notification:  08/11/20  Time of notification:  2356  Critical value read back:Yes.    Nurse who received alert:  Freddy Finner RN  MD notified (1st page):  [place]   Time of first page:  0010  MD notified (2nd page):  Time of second page:  Responding MD:  Konrad Penta   Time MD responded:  0020

## 2020-08-12 NOTE — Progress Notes (Signed)
Pt.c/o chest pain 10/10 ;V/S taken, B/P=144/76;HR=87;EKG done' NTG 0.4mg   SL given .MD on call made aware . @ 2155,pt.still with chest pain 8/10 .Another 0.4 mg NTG SL given.B/P=104/65;HR=90;@ 2200 chest pain is relieved .

## 2020-08-12 NOTE — Progress Notes (Signed)
HD#14 Subjective:  Overnight: right sided CP improved with NTGx2 No EKG changes troponin 272>254  This morning he is feeling fine. Denies right foot pain, nausea, vomiting, or chest pain. Discussed plan to have PT evaluate later today.   Objective:   Vital signs in last 24 hours: Vitals:   08/12/20 0215 08/12/20 0315 08/12/20 0451 08/12/20 0839  BP: (!) 141/68 126/66 137/72 139/76  Pulse:   83 85  Resp:   19 16  Temp:    99.4 F (37.4 C)  TempSrc:   Oral Oral  SpO2:    100%  Weight:   65.8 kg   Height:       Physical Exam Constitutional:manlying bed, in no acute distress, appears comfortable Cardiovascular: RRR, no murmurs Pulmonary/Chest: CTA, no respiratory distress. MSK: L BKA, RLE dressed small amount if bleeding on dressing Skin:warm, dry  Pertinent Labs: CBC Latest Ref Rng & Units 08/12/2020 08/11/2020 08/11/2020  WBC 4.0 - 10.5 K/uL 15.7(H) 16.1(H) 16.0(H)  Hemoglobin 13.0 - 17.0 g/dL 9.3(L) 9.3(L) 9.4(L)  Hematocrit 39.0 - 52.0 % 28.0(L) 29.6(L) 30.4(L)  Platelets 150 - 400 K/uL 447(H) 480(H) 568(H)    CMP Latest Ref Rng & Units 08/12/2020 08/11/2020 08/11/2020  Glucose 70 - 99 mg/dL 120(H) 137(H) 128(H)  BUN 8 - 23 mg/dL 18 35(H) 30(H)  Creatinine 0.61 - 1.24 mg/dL 3.87(H) 5.77(H) 5.34(H)  Sodium 135 - 145 mmol/L 138 138 139  Potassium 3.5 - 5.1 mmol/L 4.6 4.7 4.3  Chloride 98 - 111 mmol/L 98 97(L) 97(L)  CO2 22 - 32 mmol/L 24 24 26   Calcium 8.9 - 10.3 mg/dL 8.2(L) 8.3(L) 8.4(L)  Total Protein 6.5 - 8.1 g/dL - - 6.5  Total Bilirubin 0.3 - 1.2 mg/dL - - 1.1  Alkaline Phos 38 - 126 U/L - - 75  AST 15 - 41 U/L - - 17  ALT 0 - 44 U/L - - <5    Imaging: ECHO TEE  Result Date: 08/08/2020   IMPRESSIONS  1. Left ventricular ejection fraction, by estimation, is 60 to 65%. The left ventricle has normal function. The left ventricle has no regional wall motion abnormalities.   2. Right ventricular systolic function is normal. The right ventricular size is normal.    3. Left atrial size was mildly dilated. No left atrial/left atrial appendage thrombus was detected.   4. The mitral valve is normal in structure. Trivial mitral valve regurgitation. No evidence of mitral stenosis.   5. The aortic valve is normal in structure. Aortic valve regurgitation is not visualized. No aortic stenosis is present.   6. Aortic dilatation noted. There is borderline dilatation of the aortic root, measuring 38 mm. There is Moderate (Grade III) plaque involving the transverse and descending aorta.   7. Inadequate agitated saline injection to assess for interatrial shunt. Conclusion(s)/Recommendation(s): No evidence of vegetation/infective endocarditis on this transesophageal echocardiogram.      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 man with history of ESRD on HD TTS, type 2 diabetes mellitus, history of CVA, hypertension, heart failure, and peripheral vascular disease on dual antiplatelet therapy who presented after syncopal episode at dialysis and admitted for MSSA bacteremia in the setting of R1st and F2nd digit ulcerations.   This is hospital day 14.  Sepsis secondary to MSSA bacteremia R 1st and 2nd digit ulcerations History of Peripheral vascular disease s/p L BKA S/p angioplasty of the R posterior tibial artery POD 3 (1/28) Patient no pain after surgery.  Had TMA yesterday, post op day 1. WBC improving 15.7, afebrile. - Vascular consulted, appreciate reacs - ID consulted, appreciate recs - Dilaudid 0.5 mg IV q4  - IV cefazolin day 9 /14  (day 1: 1/27, last day 2/9), plan to complete 2 weeks through HD - On dual antiplatelet platelet therapy due to PVD.   Atypical CP likely 2/2 NSTEMI Patient with CP over night now resolved sp NTG x2.. Elevated troponin of 272>25 concerning for demand ischemia, especially in setting of recent angioplasty and TMA procedure earlier today. EKG without acute changes - trend troponins   Syncopal episode Post-HD hypotension ESRD on HD (TTS) Acute ischemic left thalamic infarct History of multiple prior CVAs (2017, 2021) HTN, HLD No further syncope. Getting shorten sessions of HD. Continue with HD on TTS schedule - Continue ASA and Plavix - Continue atorvastatin 80 mg  - Continued HD while hospitalized. Appreciate nephrology's assistance.   Acute on chronic anemia FOBT positive Nonerosive esophagitis Hgb of 9.3, stable - Protonix 40 mg daily  - monitor CBC  Type 2 diabetes mellitus - Lantus 7 units - SSI sensitive - Carb/renal diet   Diet: renal/carb IVF: None VTE: Heparin Code: Full PT/OT recs: SNF for Subacute PT   Please contact the on call pager after 5 pm and on weekends at [phone].  Iona Beard, MD PGY-1 Internal Medicine Teaching Service Pager: [phone] 08/12/2020

## 2020-08-12 NOTE — Plan of Care (Signed)
  Problem: Nutrition: Goal: Risk of aspiration will decrease Outcome: Progressing   Problem: Elimination: Goal: Will not experience complications related to bowel motility Outcome: Progressing

## 2020-08-12 NOTE — Progress Notes (Signed)
[facility] for Infectious Disease   Reason for visit: Follow up on osteomyelitis and bacteremia  Interval History: now s/p TMA by Dr. Donzetta Matters.  No complaints.  Afebrile, WBC 15.7, relatively unchanged.  No associate rash or diarrhea.  Day 10 total antibiotics Negative blood cultures 08/04/20, now day 8  Physical Exam: Constitutional:  Vitals:   08/12/20 0451 08/12/20 0839  BP: 137/72 139/76  Pulse: 83 85  Resp: 19 16  Temp:  99.4 F (37.4 C)  SpO2:  100%   patient appears in NAD Respiratory: Normal respiratory effort; CTA B Cardiovascular: RRR MS: right foot wrapped at site of amputation  Review of Systems: Constitutional: negative for fevers and chills Integument/breast: negative for rash  Lab Results  Component Value Date   WBC 15.7 (H) 08/12/2020   HGB 9.3 (L) 08/12/2020   HCT 28.0 (L) 08/12/2020   MCV 92.1 08/12/2020   PLT 447 (H) 08/12/2020    Lab Results  Component Value Date   CREATININE 3.87 (H) 08/12/2020   BUN 18 08/12/2020   NA 138 08/12/2020   K 4.6 08/12/2020   CL 98 08/12/2020   CO2 24 08/12/2020    Lab Results  Component Value Date   ALT <5 08/11/2020   AST 17 08/11/2020   ALKPHOS 75 08/11/2020     Microbiology: Recent Results (from the past 240 hour(s))  SARS CORONAVIRUS 2 (TAT 6-24 HRS) Nasopharyngeal Nasopharyngeal Swab     Status: None   Collection Time: 08/03/20  5:12 PM   Specimen: Nasopharyngeal Swab  Result Value Ref Range Status   SARS Coronavirus 2 NEGATIVE NEGATIVE Final    Comment: (NOTE) SARS-CoV-2 target nucleic acids are NOT DETECTED.  The SARS-CoV-2 RNA is generally detectable in upper and lower respiratory specimens during the acute phase of infection. Negative results do not preclude SARS-CoV-2 infection, do not rule out co-infections with other pathogens, and should not be used as the sole basis for treatment or other patient management decisions. Negative results must be combined with clinical  observations, patient history, and epidemiological information. The expected result is Negative.  Fact Sheet for Patients: SugarRoll.be  Fact Sheet for Healthcare Providers: https://www.woods-mathews.com/  This test is not yet approved or cleared by the [place] FDA and  has been authorized for detection and/or diagnosis of SARS-CoV-2 by FDA under an Emergency Use Authorization (EUA). This EUA will remain  in effect (meaning this test can be used) for the duration of the COVID-19 declaration under Se ction 564(b)(1) of the Act, 21 U.S.C. section 360bbb-3(b)(1), unless the authorization is terminated or revoked sooner.  Performed at [facility], [place] [address], [place], [place] [address]   Culture, blood (routine x 2)     Status: Abnormal   Collection Time: 08/03/20  5:58 PM   Specimen: BLOOD  Result Value Ref Range Status   Specimen Description BLOOD SITE NOT SPECIFIED  Final   Special Requests   Final    BOTTLES DRAWN AEROBIC AND ANAEROBIC Blood Culture adequate volume   Culture  Setup Time   Final    GRAM POSITIVE COCCI IN CLUSTERS IN BOTH AEROBIC AND ANAEROBIC BOTTLES CRITICAL RESULT CALLED TO, READ BACK BY AND VERIFIED WITH: Sharen Heck PharmD 12:30 08/04/20 ([email or web address]) Performed at [facility], [place] [address], [place], [place] [address]    Culture STAPHYLOCOCCUS AUREUS (A)  Final   Report Status 08/06/2020 FINAL  Final   Organism ID, Bacteria STAPHYLOCOCCUS AUREUS  Final  Susceptibility   Staphylococcus aureus - MIC*    CIPROFLOXACIN <=0.5 SENSITIVE Sensitive     ERYTHROMYCIN <=0.25 SENSITIVE Sensitive     GENTAMICIN <=0.5 SENSITIVE Sensitive     OXACILLIN 1 SENSITIVE Sensitive     TETRACYCLINE <=1 SENSITIVE Sensitive     VANCOMYCIN <=0.5 SENSITIVE Sensitive     TRIMETH/SULFA <=10 SENSITIVE Sensitive     CLINDAMYCIN <=0.25 SENSITIVE Sensitive     RIFAMPIN <=0.5 SENSITIVE Sensitive     Inducible  Clindamycin NEGATIVE Sensitive     * STAPHYLOCOCCUS AUREUS  Blood Culture ID Panel (Reflexed)     Status: Abnormal   Collection Time: 08/03/20  5:58 PM  Result Value Ref Range Status   Enterococcus faecalis NOT DETECTED NOT DETECTED Final   Enterococcus Faecium NOT DETECTED NOT DETECTED Final   Listeria monocytogenes NOT DETECTED NOT DETECTED Final   Staphylococcus species DETECTED (A) NOT DETECTED Final    Comment: CRITICAL RESULT CALLED TO, READ BACK BY AND VERIFIED WITH: Sharen Heck PharmD 12:30 08/04/20 ([email or web address])    Staphylococcus aureus (BCID) DETECTED (A) NOT DETECTED Final    Comment: CRITICAL RESULT CALLED TO, READ BACK BY AND VERIFIED WITH: Sharen Heck PharmD 12:30 08/04/20 ([email or web address])    Staphylococcus epidermidis NOT DETECTED NOT DETECTED Final   Staphylococcus lugdunensis NOT DETECTED NOT DETECTED Final   Streptococcus species NOT DETECTED NOT DETECTED Final   Streptococcus agalactiae NOT DETECTED NOT DETECTED Final   Streptococcus pneumoniae NOT DETECTED NOT DETECTED Final   Streptococcus pyogenes NOT DETECTED NOT DETECTED Final   A.calcoaceticus-baumannii NOT DETECTED NOT DETECTED Final   Bacteroides fragilis NOT DETECTED NOT DETECTED Final   Enterobacterales NOT DETECTED NOT DETECTED Final   Enterobacter cloacae complex NOT DETECTED NOT DETECTED Final   Escherichia coli NOT DETECTED NOT DETECTED Final   Klebsiella aerogenes NOT DETECTED NOT DETECTED Final   Klebsiella oxytoca NOT DETECTED NOT DETECTED Final   Klebsiella pneumoniae NOT DETECTED NOT DETECTED Final   Proteus species NOT DETECTED NOT DETECTED Final   Salmonella species NOT DETECTED NOT DETECTED Final   Serratia marcescens NOT DETECTED NOT DETECTED Final   Haemophilus influenzae NOT DETECTED NOT DETECTED Final   Neisseria meningitidis NOT DETECTED NOT DETECTED Final   Pseudomonas aeruginosa NOT DETECTED NOT DETECTED Final   Stenotrophomonas maltophilia NOT DETECTED NOT DETECTED Final   Candida albicans  NOT DETECTED NOT DETECTED Final   Candida auris NOT DETECTED NOT DETECTED Final   Candida glabrata NOT DETECTED NOT DETECTED Final   Candida krusei NOT DETECTED NOT DETECTED Final   Candida parapsilosis NOT DETECTED NOT DETECTED Final   Candida tropicalis NOT DETECTED NOT DETECTED Final   Cryptococcus neoformans/gattii NOT DETECTED NOT DETECTED Final   Meth resistant mecA/C and MREJ NOT DETECTED NOT DETECTED Final    Comment: Performed at [facility], [address] [place], [place] [address]  Culture, blood (routine x 2)     Status: Abnormal   Collection Time: 08/03/20  6:03 PM   Specimen: BLOOD  Result Value Ref Range Status   Specimen Description BLOOD SITE NOT SPECIFIED  Final   Special Requests   Final    BOTTLES DRAWN AEROBIC AND ANAEROBIC Blood Culture adequate volume   Culture  Setup Time   Final    GRAM POSITIVE COCCI IN CLUSTERS IN BOTH AEROBIC AND ANAEROBIC BOTTLES CRITICAL VALUE NOTED.  VALUE IS CONSISTENT WITH PREVIOUSLY REPORTED AND CALLED VALUE.    Culture (A)  Final    STAPHYLOCOCCUS AUREUS SUSCEPTIBILITIES PERFORMED ON PREVIOUS  CULTURE WITHIN THE LAST 5 DAYS. Performed at [facility], [place] [address] [place], [place] [address]    Report Status 08/06/2020 FINAL  Final  Culture, blood (Routine X 2) w Reflex to ID Panel     Status: None   Collection Time: 08/04/20  1:00 PM   Specimen: BLOOD LEFT HAND  Result Value Ref Range Status   Specimen Description BLOOD LEFT HAND  Final   Special Requests   Final    BOTTLES DRAWN AEROBIC AND ANAEROBIC Blood Culture adequate volume   Culture   Final    NO GROWTH 5 DAYS Performed at [facility], [place] [address] [place], [place] [address]    Report Status 08/09/2020 FINAL  Final  Culture, blood (Routine X 2) w Reflex to ID Panel     Status: None   Collection Time: 08/04/20  1:00 PM   Specimen: BLOOD LEFT HAND  Result Value Ref Range Status   Specimen Description BLOOD LEFT HAND  Final   Special  Requests   Final    BOTTLES DRAWN AEROBIC AND ANAEROBIC Blood Culture adequate volume   Culture   Final    NO GROWTH 5 DAYS Performed at [facility], [place] [address], [place], [place] [address]    Report Status 08/09/2020 FINAL  Final  MRSA PCR Screening     Status: None   Collection Time: 08/05/20  5:11 AM   Specimen: Nasopharyngeal  Result Value Ref Range Status   MRSA by PCR NEGATIVE NEGATIVE Final    Comment:        The GeneXpert MRSA Assay (FDA approved for NASAL specimens only), is one component of a comprehensive MRSA colonization surveillance program. It is not intended to diagnose MRSA infection nor to guide or monitor treatment for MRSA infections. Performed at [facility], [place] [address] [place], [place] [address]   SARS Coronavirus 2 by RT PCR (hospital order, performed in [facility] hospital lab) Nasopharyngeal Nasopharyngeal Swab     Status: None   Collection Time: 08/07/20  1:51 PM   Specimen: Nasopharyngeal Swab  Result Value Ref Range Status   SARS Coronavirus 2 NEGATIVE NEGATIVE Final    Comment: (NOTE) SARS-CoV-2 target nucleic acids are NOT DETECTED.  The SARS-CoV-2 RNA is generally detectable in upper and lower respiratory specimens during the acute phase of infection. The lowest concentration of SARS-CoV-2 viral copies this assay can detect is 250 copies / mL. A negative result does not preclude SARS-CoV-2 infection and should not be used as the sole basis for treatment or other patient management decisions.  A negative result may occur with improper specimen collection / handling, submission of specimen other than nasopharyngeal swab, presence of viral mutation(s) within the areas targeted by this assay, and inadequate number of viral copies (<250 copies / mL). A negative result must be combined with clinical observations, patient history, and epidemiological information.  Fact Sheet for Patients:    StrictlyIdeas.no  Fact Sheet for Healthcare Providers: BankingDealers.co.za  This test is not yet approved or  cleared by the [place] FDA and has been authorized for detection and/or diagnosis of SARS-CoV-2 by FDA under an Emergency Use Authorization (EUA).  This EUA will remain in effect (meaning this test can be used) for the duration of the COVID-19 declaration under Section 564(b)(1) of the Act, 21 U.S.C. section 360bbb-3(b)(1), unless the authorization is terminated or revoked sooner.  Performed at [facility], [place]  [address], [place], [place] [address]   Surgical PCR screen     Status: None   Collection Time: 08/09/20  5:03 AM   Specimen: Nasal Mucosa; Nasal Swab  Result Value Ref Range Status   MRSA, PCR NEGATIVE NEGATIVE Final   Staphylococcus aureus NEGATIVE NEGATIVE Final    Comment: (NOTE) The Xpert SA Assay (FDA approved for NASAL specimens in patients 15 years of age and older), is one component of a comprehensive surveillance program. It is not intended to diagnose infection nor to guide or monitor treatment. Performed at [facility], [place] [address] [place], [place] [address]     Impression/Plan:  1. Gangrenous right lower extremity - now s/p TMA with hope of limb salvage.  Unfortunately, little blood flow in the area with calcified vessels so high risk for the need for higher amputation.    2.  Bacteremia - MSSA and TEE negative for vegetation, now foot gangrene removed.  I recommend continuing with cefazolin with dialysis for 2 weeks from negative blood culture through Feb 10th and then stop.   3.  ESRD - on dialysis and next session tomorrow.    I will sign off, call with questions.

## 2020-08-12 NOTE — Care Management Important Message (Signed)
Important Message  Patient Details  Name: Dennis Macias MRN: [id number] Date of Birth: February 05, 1951   Medicare Important Message Given:  Yes     [name] 08/12/2020, 11:13 AM

## 2020-08-12 NOTE — Progress Notes (Signed)
Orthopedic Tech Progress Note Patient Details:  Kalee Mcclenathan 1951-06-23 [id number] Called in order to [facility] for a [facility] SHOE Patient ID: Desean Heemstra, male   DOB: [birth year], 70 y.o.   MRN: [id number]   Janit Pagan 08/12/2020, 8:17 AM

## 2020-08-12 NOTE — Progress Notes (Addendum)
[facility] Progress Note   Subjective:  Patient seen and examined in room.  Continues to have nausea today, unable to eat his breakfast.  Denies CP, SOB, vomiting, diarrhea and edema. Pain in R foot well controlled.  Objective Vitals:   08/12/20 0215 08/12/20 0315 08/12/20 0451 08/12/20 0839  BP: (!) 141/68 126/66 137/72 139/76  Pulse:   83 85  Resp:   19 16  Temp:    99.4 F (37.4 C)  TempSrc:   Oral Oral  SpO2:    100%  Weight:   65.8 kg   Height:       Physical Exam General:chronically ill appearing male, alert in NAD Heart:RRR, no mrg Lungs:CTAB, nml WOB on RA Abdomen:soft, NTND, +BS Extremities:L BKA, R TMA wrapped - no edema Dialysis Access: RU AVF +b   Filed Weights   08/11/20 1415 08/11/20 1657 08/12/20 0451  Weight: 67.4 kg 65.9 kg 65.8 kg    Intake/Output Summary (Last 24 hours) at 08/12/2020 0934 Last data filed at 08/11/2020 1657 Gross per 24 hour  Intake 350 ml  Output 1520 ml  Net -1170 ml    Additional Objective Labs: Basic Metabolic Panel: Recent Labs  Lab 08/09/20 0205 08/10/20 0229 08/11/20 0202 08/11/20 0853 08/12/20 0036  NA 138 139 139 138 138  K 4.2 4.1 4.3 4.7 4.6  CL 103 97* 97* 97* 98  CO2 19* 27 26 24 24   GLUCOSE 108* 120* 128* 137* 120*  BUN 41* 18 30* 35* 18  CREATININE 5.46* 3.48* 5.34* 5.77* 3.87*  CALCIUM 8.3* 8.2* 8.4* 8.3* 8.2*  PHOS 3.8 2.4*  --  4.2  --    Liver Function Tests: Recent Labs  Lab 08/09/20 1121 08/10/20 0229 08/11/20 0202 08/11/20 0853  AST 17  --  17  --   ALT <5  --  <5  --   ALKPHOS 72  --  75  --   BILITOT 0.7  --  1.1  --   PROT 6.0*  --  6.5  --   ALBUMIN 2.0* 1.9* 1.9* 1.9*   CBC: Recent Labs  Lab 08/07/20 0425 08/08/20 0240 08/09/20 0150 08/10/20 0229 08/10/20 1100 08/10/20 1930 08/11/20 0202 08/11/20 2233 08/12/20 0036  WBC 13.1*   < > 13.1*   < > 15.3* 18.3* 16.0* 16.1* 15.7*  NEUTROABS 10.8*  --  11.1*  --   --   --   --  13.1*  --   HGB 7.5*   < > 7.7*   <  > 7.8* 9.7* 9.4* 9.3* 9.3*  HCT 22.8*   < > 23.8*   < > 25.3* 30.7* 30.4* 29.6* 28.0*  MCV 90.8   < > 97.5   < > 95.5 92.2 94.7 92.8 92.1  PLT 340   < > 418*   < > 497* 512* 568* 480* 447*   < > = values in this interval not displayed.   Blood Culture    Component Value Date/Time   SDES BLOOD LEFT HAND 08/04/2020 1300   SDES BLOOD LEFT HAND 08/04/2020 1300   SPECREQUEST  08/04/2020 1300    BOTTLES DRAWN AEROBIC AND ANAEROBIC Blood Culture adequate volume   SPECREQUEST  08/04/2020 1300    BOTTLES DRAWN AEROBIC AND ANAEROBIC Blood Culture adequate volume   CULT  08/04/2020 1300    NO GROWTH 5 DAYS Performed at [facility], [place] [address], [place], [place] [address]    CULT  08/04/2020 1300  NO GROWTH 5 DAYS Performed at [facility], [place] [address], [place], [place] [address]    REPTSTATUS 08/09/2020 FINAL 08/04/2020 1300   REPTSTATUS 08/09/2020 FINAL 08/04/2020 1300    CBG: Recent Labs  Lab 08/11/20 1233 08/11/20 1335 08/11/20 1737 08/11/20 2117 08/12/20 0739  GLUCAP 131* 138* 124* 131* 113*    Medications: . sodium chloride 250 mL (08/06/20 0342)  .  ceFAZolin (ANCEF) IV 1 g (08/11/20 1758)   . aspirin EC  81 mg Oral Daily  . atorvastatin  80 mg Oral QHS  . Chlorhexidine Gluconate Cloth  6 each Topical Q0600  . clopidogrel  75 mg Oral Daily  . darbepoetin (ARANESP) injection - DIALYSIS  40 mcg Intravenous Q Sat-HD  . docusate sodium  100 mg Oral Daily  . dorzolamide-timolol  1 drop Right Eye BID  . finasteride  5 mg Oral Daily  . heparin      . heparin injection (subcutaneous)  5,000 Units Subcutaneous Q8H  . insulin aspart  0-6 Units Subcutaneous TID WC  . insulin glargine  7 Units Subcutaneous QHS  . mupirocin ointment  1 application Nasal BID  . pantoprazole (PROTONIX) IV  40 mg Intravenous QHS  . polyethylene glycol  17 g Oral Daily  . sodium chloride flush  3 mL Intravenous Q12H    Dialysis Orders: TTS -[facility] 4hrs, BFR400,  [id number], EDW 69kg,2K/2.25Ca Access:RU AVF Heparin4000 unit bolus Hectorol79mcg IV qHD  Assessment/Plan:  1. Sepsis 2/2 MSSA bacteremia. ID consulted,TEE negative for endocarditis. Suspected source gangrenous toes s/p TMA 08/11/20.  2. Syncopal episode -likely multifactorial - acute CVA, hypotension, and acute on chronic anemia. Not volume overloaded.No further syncope. Minimal UF with HD. 3. Acute CVA- seen on MRI. Thought to be related to cerebral hypoperfusion with hypotension post HD, as this was the cause of a past stroke. Per neuro/Admit 4. R 1st & 2nd digit ulcerations/Hx PVD: S/p RLE balloon angioplasty to PTA 05/29/20.VVS consulted, wentfor arteriogram01/28/22, followed by TMA on 08/11/20. High risk for BKA per Dr. Donzetta Matters. 5. ESRD:Continue TTS schedule. HD treatments shortened due to high patient census/staffing shortage. Not uremic or volume overloaded onexam. Next HD tomorrow. 6. Hypertension/volume: BPmostlycontrolled.Belowdry wt with no volume overload on exam. Likely losing some weight with NPO status/ongoing nausea.  Lower dry on d/c.  7. Acute on chronic Anemiaof CKD:+ FOBT, Hgb drop 11.3 (on 1/13 as OP) to 7.4, s/p 1 unit pRBC1/20and another unit on 1/24.GI consulted,underwent EGD1/25which showed esophagitis.Hgb now9.3.Continue ESA. 8. Secondary Hyperparathyroidism:Corrected calciumcontrolled, has been running high,Phoswas low andbinders discontinued, in goal yesterday at 4.2. Hectorolon hold, follow Ca. 9. DMT2: per admitting team    Jen Mow, PA-C [facility] 08/12/2020,9:34 AM  LOS: 14 days   I have seen and examined this patient and agree with plan and assessment in the above note with renal recommendations/intervention highlighted.  [name] Abagayle Klutts,MD 08/12/2020 1:33 PM

## 2020-08-12 NOTE — Progress Notes (Signed)
IMTS Progress Note:  Subjective:  Paged by RN due to new CP. Patient endorses right-sided CP that improved after NTG x 2. He is unable to further characterize this pain, although states that it is new. Has a bag for nausea against his chin although denies nausea, abdominal pain, or any other symptoms.   Objective:   Vitals: BP 147/75, HR 83, SpO2 98% on room air, Afebrile   General: Patient resting comfortably in no acute distress Cardiovascular: RRR. No murmurs, rubs or gallops. Bilateral radial pulses 2+.  Respiratory: Lung sounds CTA bilaterally anteriorly without tachypnea or increased WOB on R/A Neurological: Patient is awake and alert although slow to respond, only following intermittent commands Musculoskeletal: No chest wall TTP  EKG: NSR with LAD, RBBB, LVH, Q waves and T wave inversions in V1 and V2, all unchanged from previous EKG without new ST or T wave changes.   Labs: Troponin 272 WBC 16.1, Hgb 9.3  Assessment / Plan:  # Atypical CP likely 2/2 NSTEMI Patient has significant vasculopathy with PVD s/p BKA, recent ischemic stroke, ESRD on HD and type II DM. Elevated troponin concerning for demand ischemia, especially in setting of recent angioplasty and TMA procedure earlier today. Last cardiac cath performed 11/2019 showed 80% stenosis of the LAD although this was managed medically without PCI. Recent ECHO with normal EF and no WMA's.   - Spoke with cardiology, appreciate their recommendations - Will trend troponins  - Check EKG if patient continues to complain of CP   # Normocytic Anemia, Stable  STAT CBC ordered given CP in setting of recent GI bleeding; however, hemoglobin stable at 9.3.   - Continue to monitor for signs of ongoing bleeding   Jeralyn Bennett, MD 08/12/2020, 1:25 AM Pager: [phone]

## 2020-08-12 NOTE — Progress Notes (Addendum)
  Progress Note    08/12/2020 8:05 AM 1 Day Post-Op  Subjective:  No complaints   Vitals:   08/12/20 0315 08/12/20 0451  BP: 126/66 137/72  Pulse:  83  Resp:  19  Temp:    SpO2:     Physical Exam: Lungs:  Non labored Incisions:  R TMA incision with viable skin edges Extremities:  Feet symmetrically warm Neurologic: A&O  CBC    Component Value Date/Time   WBC 15.7 (H) 08/12/2020 0036   RBC 3.04 (L) 08/12/2020 0036   HGB 9.3 (L) 08/12/2020 0036   HGB 11.1 (L) 05/02/2020 1454   HCT 28.0 (L) 08/12/2020 0036   HCT 35.5 (L) 05/02/2020 1454   PLT 447 (H) 08/12/2020 0036   PLT 221 05/02/2020 1454   MCV 92.1 08/12/2020 0036   MCV 86 05/02/2020 1454   MCH 30.6 08/12/2020 0036   MCHC 33.2 08/12/2020 0036   RDW 15.5 08/12/2020 0036   RDW 15.7 (H) 05/02/2020 1454   LYMPHSABS 1.8 08/11/2020 2233   LYMPHSABS 1.3 05/02/2020 1454   MONOABS 0.9 08/11/2020 2233   EOSABS 0.0 08/11/2020 2233   EOSABS 0.0 05/02/2020 1454   BASOSABS 0.0 08/11/2020 2233   BASOSABS 0.0 05/02/2020 1454    BMET    Component Value Date/Time   NA 138 08/12/2020 0036   NA 137 12/10/2019 1439   K 4.6 08/12/2020 0036   CL 98 08/12/2020 0036   CO2 24 08/12/2020 0036   GLUCOSE 120 (H) 08/12/2020 0036   BUN 18 08/12/2020 0036   BUN 80 (HH) 12/10/2019 1439   CREATININE 3.87 (H) 08/12/2020 0036   CALCIUM 8.2 (L) 08/12/2020 0036   GFRNONAA 16 (L) 08/12/2020 0036   GFRAA 19 (L) 04/05/2020 0705    INR    Component Value Date/Time   INR 1.5 (H) 08/11/2020 0202     Intake/Output Summary (Last 24 hours) at 08/12/2020 0805 Last data filed at 08/11/2020 1657 Gross per 24 hour  Intake 350 ml  Output 1520 ml  Net -1170 ml     Assessment/Plan:  70 y.o. male is s/p R PTA angioplasty with subsequent TMA 1 Day Post-Op   TMA dressing changed; continue daily changes with vaso, gauze, kerlex, and ace Darco shoe ordered; heel weightbearing only Ok to restart coumadin   Dagoberto Ligas, PA-C [facility] [phone] 08/12/2020 8:05 AM  I have independently interviewed and examined patient and agree with PA assessment and plan above.  Based on TMA yesterday will be high risk for below-knee amputation  Brandon C. Donzetta Matters, MD [facility] [place] Office: [phone] Pager: [phone]

## 2020-08-12 NOTE — Progress Notes (Signed)
Occupational Therapy Treatment Patient Details Name: Dennis Macias MRN: [id number] DOB: 1950/12/06 Today's Date: 08/12/2020    History of present illness Pt is a 70 y/o male with PMH of L BKA, s/p balloon angioplasty of occluded vessels R foot 11/21, L ICA CVA (2017), NIDDM type II with retinopathy, CKD on hemodialysis, nephrotic syndrome, combined systolic and diastolic CHF, CAD w/ NSTEMI, HTN, HLD, tubular adenmoa of colon, tachycardia, syncope, PVD, and protein malnutrition who presents after a syncopal episode at hemodialysis. His R foot wounds continue to worsen with noted dry gangrene on 1st and 2nd toes. now s/p angioplasty of the right posterior tibial artery on 1/27; MRI revealed acute ischemic nonhemorrhagic L thalamic infarct and multiple chronic micro hemorrhages scattered throughout both cerebral hemispheres; R TMA 08/11/20.   OT comments  Pt assisted to transfer to chair using lateral scoot with very minimal assistance at transition between bed and chair. Pt declined attempt to stand in [place]. Participated in grooming and ate minimally once up in chair. Pt with flat affect and slow processing speed. Demonstrates poor sitting balance with LOB posterior and to R at EOB. Continue to recommend SNF.  Follow Up Recommendations  SNF;Supervision/Assistance - 24 hour;Home health OT    Equipment Recommendations  None recommended by OT    Recommendations for Other Services      Precautions / Restrictions Precautions Precautions: Fall Required Braces or Orthoses: Other Brace Other Brace: darco shoe on R Restrictions Weight Bearing Restrictions: Yes RLE Weight Bearing:  (WB through heel in darco shoe)       Mobility Bed Mobility Overal bed mobility: Needs Assistance Bed Mobility: Supine to Sit     Supine to sit: Mod assist     General bed mobility comments: assist to raise trunk  Transfers Overall transfer level: Needs assistance   Transfers: Lateral/Scoot Transfers           Lateral/Scoot Transfers: Min assist General transfer comment: min assist with use of pad midway through transfer    Balance Overall balance assessment: Needs assistance   Sitting balance-Leahy Scale: Poor Sitting balance - Comments: LOB posterior and R                                   ADL either performed or assessed with clinical judgement   ADL Overall ADL's : Needs assistance/impaired Eating/Feeding: Set up;Sitting   Grooming: Wash/dry hands;Wash/dry face;Sitting;Set up               Lower Body Dressing: Total assistance;Bed level Lower Body Dressing Details (indicate cue type and reason): to don darco                     Vision       Perception     Praxis      Cognition Arousal/Alertness: Awake/alert Behavior During Therapy: Flat affect Overall Cognitive Status: No family/caregiver present to determine baseline cognitive functioning                                          Exercises     Shoulder Instructions       General Comments      Pertinent Vitals/ Pain       Pain Assessment: Faces Faces Pain Scale: Hurts little more Pain Location: R foot Pain Descriptors /  Indicators: Discomfort Pain Intervention(s): Repositioned;Patient requesting pain meds-RN notified  Home Living                                          Prior Functioning/Environment              Frequency  Min 2X/week        Progress Toward Goals  OT Goals(current goals can now be found in the care plan section)  Progress towards OT goals: Progressing toward goals  Acute Rehab OT Goals Patient Stated Goal: agreeable to OOB OT Goal Formulation: With patient Time For Goal Achievement: 08/26/20 Potential to Achieve Goals: [place] Discharge plan remains appropriate    Co-evaluation                 AM-PAC OT "6 Clicks" Daily Activity     Outcome Measure   Help from another person eating meals?:  None Help from another person taking care of personal grooming?: A Little Help from another person toileting, which includes using toliet, bedpan, or urinal?: A Lot Help from another person bathing (including washing, rinsing, drying)?: A Lot Help from another person to put on and taking off regular upper body clothing?: A Little Help from another person to put on and taking off regular lower body clothing?: Total 6 Click Score: 15    End of Session    OT Visit Diagnosis: Muscle weakness (generalized) (M62.81);Other symptoms and signs involving cognitive function;Pain   Activity Tolerance Patient tolerated treatment well   Patient Left in chair;with call bell/phone within reach;with chair alarm set   Nurse Communication Patient requests pain meds        Time: 0912-0942 OT Time Calculation (min): 30 min  Charges: OT General Charges $OT Visit: 1 Visit OT Treatments $Self Care/Home Management : 23-37 mins  Dennis Macias, OTR/L Acute Rehabilitation Services Pager: [phone] Office: [phone]   Dennis Macias 08/12/2020, 9:50 AM

## 2020-08-12 NOTE — Progress Notes (Signed)
Physical Therapy Treatment Patient Details Name: Dennis Macias MRN: [id number] DOB: 06-Jan-1951 Today's Date: 08/12/2020    History of Present Illness Pt is a 70 y/o male with PMH of L BKA, s/p balloon angioplasty of occluded vessels R foot 11/21, L ICA CVA (2017), NIDDM type II with retinopathy, CKD on hemodialysis, nephrotic syndrome, combined systolic and diastolic CHF, CAD w/ NSTEMI, HTN, HLD, tubular adenmoa of colon, tachycardia, syncope, PVD, and protein malnutrition who presents after a syncopal episode at hemodialysis. His R foot wounds continue to worsen with noted dry gangrene on 1st and 2nd toes. now s/p angioplasty of the right posterior tibial artery on 1/27; MRI revealed acute ischemic nonhemorrhagic L thalamic infarct and multiple chronic micro hemorrhages scattered throughout both cerebral hemispheres; R TMA 08/11/20.    PT Comments    Patient progressing slowly towards PT goals. Pt now s/p right TMA 08/11/20 and exhibits some post surgical deficits of RLE. Pt with little to no effort/initiation with transfer back to bed requiring Max A of 2 for squat pivot transfer towards right side. Requires repetition to follow commands/answer questions. Education re: darco shoe and new WB status through heel. Encouraged there ex and elevation of RLE with heel offloading to prevent pressure wound.  Continues to be appropriate for SNF. Will follow.   Follow Up Recommendations  SNF;Supervision/Assistance - 24 hour     Equipment Recommendations  None recommended by PT    Recommendations for Other Services       Precautions / Restrictions Precautions Precautions: Fall Precaution Comments: hx of L BKA; HOH Required Braces or Orthoses: Other Brace Other Brace: darco shoe on Rt Restrictions Weight Bearing Restrictions: Yes RLE Weight Bearing: Weight bearing as tolerated (thru heel only)    Mobility  Bed Mobility Overal bed mobility: Needs Assistance Bed Mobility: Sit to Supine     Supine  to sit: Mod assist Sit to supine: Mod assist   General bed mobility comments: Assist to bring LEs into bed; pt with LOB onto left side once sitting EOB.  Transfers Overall transfer level: Needs assistance Equipment used: 2 person hand held assist Transfers: Squat Pivot Transfers     Squat pivot transfers: Max assist;+2 physical assistance    Lateral/Scoot Transfers: Min assist General transfer comment: Max A of 2 for squat pivot transfer towards right with little to no effort/initiation with patient. Cues for WB through right heel.  Ambulation/Gait                 Stairs             Wheelchair Mobility    Modified Rankin (Stroke Patients Only)       Balance Overall balance assessment: Needs assistance Sitting-balance support: Feet supported;Single extremity supported (foot supported) Sitting balance-Leahy Scale: Poor Sitting balance - Comments: LOB posterior and to the left today                                    Cognition Arousal/Alertness: Awake/alert Behavior During Therapy: Flat affect Overall Cognitive Status: No family/caregiver present to determine baseline cognitive functioning                                 General Comments: Pt HOH so requires repetition to follow commands, answer questions. Limited to no initiation/assist with mobility today despite cues.      Exercises  General Comments General comments (skin integrity, edema, etc.): Limited by lack of effort/participation.      Pertinent Vitals/Pain Pain Assessment: Faces Faces Pain Scale: Hurts a little bit Pain Location: R foot Pain Descriptors / Indicators: Discomfort Pain Intervention(s): Repositioned;Monitored during session;Limited activity within patient's tolerance    Home Living                      Prior Function            PT Goals (current goals can now be found in the care plan section) Acute Rehab PT Goals Patient  Stated Goal: agreeable to OOB Progress towards PT goals: Progressing toward goals (slowly)    Frequency    Min 2X/week      PT Plan Current plan remains appropriate    Co-evaluation              AM-PAC PT "6 Clicks" Mobility   Outcome Measure  Help needed turning from your back to your side while in a flat bed without using bedrails?: A Little Help needed moving from lying on your back to sitting on the side of a flat bed without using bedrails?: A Lot Help needed moving to and from a bed to a chair (including a wheelchair)?: A Lot Help needed standing up from a chair using your arms (e.g., wheelchair or bedside chair)?: Total Help needed to walk in hospital room?: Total Help needed climbing 3-5 steps with a railing? : Total 6 Click Score: 10    End of Session Equipment Utilized During Treatment: Gait belt Activity Tolerance: Other (comment) (limited by decreased participation/effort) Patient left: in bed;with call bell/phone within reach;with bed alarm set;with nursing/sitter in room Nurse Communication: Need for lift equipment;Mobility status PT Visit Diagnosis: Unsteadiness on feet (R26.81);Muscle weakness (generalized) (M62.81);Difficulty in walking, not elsewhere classified (R26.2);Other symptoms and signs involving the nervous system (R29.898)     Time: 0355-9741 PT Time Calculation (min) (ACUTE ONLY): 13 min  Charges:  $Therapeutic Activity: 8-22 mins                     Marisa Severin, PT, DPT Acute Rehabilitation Services Pager [phone] Office [place] 08/12/2020, 11:52 AM

## 2020-08-13 LAB — BASIC METABOLIC PANEL
Anion gap: 14 (ref 5–15)
BUN: 31 mg/dL — ABNORMAL HIGH (ref 8–23)
CO2: 27 mmol/L (ref 22–32)
Calcium: 8.4 mg/dL — ABNORMAL LOW (ref 8.9–10.3)
Chloride: 97 mmol/L — ABNORMAL LOW (ref 98–111)
Creatinine, Ser: 5.66 mg/dL — ABNORMAL HIGH (ref 0.61–1.24)
GFR, Estimated: 10 mL/min — ABNORMAL LOW (ref >60)
Glucose, Bld: 101 mg/dL — ABNORMAL HIGH (ref 70–99)
Potassium: 4.3 mmol/L (ref 3.5–5.1)
Sodium: 138 mmol/L (ref 135–145)

## 2020-08-13 LAB — CBC
HCT: 27.9 % — ABNORMAL LOW (ref 39.0–52.0)
Hemoglobin: 9 g/dL — ABNORMAL LOW (ref 13.0–17.0)
MCH: 30 pg (ref 26.0–34.0)
MCHC: 32.3 g/dL (ref 30.0–36.0)
MCV: 93 fL (ref 80.0–100.0)
Platelets: 431 10*3/uL — ABNORMAL HIGH (ref 150–400)
RBC: 3 MIL/uL — ABNORMAL LOW (ref 4.22–5.81)
RDW: 15.2 % (ref 11.5–15.5)
WBC: 14.5 10*3/uL — ABNORMAL HIGH (ref 4.0–10.5)
nRBC: 0 % (ref 0.0–0.2)

## 2020-08-13 LAB — GLUCOSE, CAPILLARY
Glucose-Capillary: 83 mg/dL (ref 70–99)
Glucose-Capillary: 104 mg/dL — ABNORMAL HIGH (ref 70–99)
Glucose-Capillary: 92 mg/dL (ref 70–99)
Glucose-Capillary: 108 mg/dL — ABNORMAL HIGH (ref 70–99)

## 2020-08-13 MED ORDER — ACETAMINOPHEN 325 MG PO TABS
ORAL_TABLET | ORAL | Status: AC
  Administered 2020-08-13: 650 mg via ORAL
  Filled 2020-08-13: qty 2

## 2020-08-13 MED ORDER — DARBEPOETIN ALFA 40 MCG/0.4ML IJ SOSY
PREFILLED_SYRINGE | INTRAMUSCULAR | Status: AC
  Filled 2020-08-13: qty 0.4

## 2020-08-13 NOTE — Progress Notes (Addendum)
[facility] Progress Note   Subjective:   Patient seen and examined at bedside.  No specific complaints.  Nausea improved today.  Denies CP, SOB, abdominal pain, and n/v/d.   Objective Vitals:   08/12/20 1940 08/13/20 0340 08/13/20 0417 08/13/20 0849  BP: 123/69 133/70 137/70 130/65  Pulse: 75  81 75  Resp: 15 17 18 15   Temp: 99.4 F (37.4 C)  99.5 F (37.5 C) 98.6 F (37 C)  TempSrc: Oral  Oral Oral  SpO2: 97%   100%  Weight:   62.3 kg   Height:       Physical Exam General:chronically ill appearing alert male in NAD Heart:RRR, no mrg Lungs:CTAB, nml WOB on RA Abdomen:soft, NTND Extremities:L BKA, R TMA dressed - no edema Dialysis Access: RU AVF +b   Filed Weights   08/11/20 1657 08/12/20 0451 08/13/20 0417  Weight: 65.9 kg 65.8 kg 62.3 kg   No intake or output data in the 24 hours ending 08/13/20 0915  Additional Objective Labs: Basic Metabolic Panel: Recent Labs  Lab 08/09/20 0205 08/10/20 0229 08/11/20 0202 08/11/20 0853 08/12/20 0036 08/13/20 0208  NA 138 139   < > 138 138 138  K 4.2 4.1   < > 4.7 4.6 4.3  CL 103 97*   < > 97* 98 97*  CO2 19* 27   < > 24 24 27   GLUCOSE 108* 120*   < > 137* 120* 101*  BUN 41* 18   < > 35* 18 31*  CREATININE 5.46* 3.48*   < > 5.77* 3.87* 5.66*  CALCIUM 8.3* 8.2*   < > 8.3* 8.2* 8.4*  PHOS 3.8 2.4*  --  4.2  --   --    < > = values in this interval not displayed.   Liver Function Tests: Recent Labs  Lab 08/09/20 1121 08/10/20 0229 08/11/20 0202 08/11/20 0853  AST 17  --  17  --   ALT <5  --  <5  --   ALKPHOS 72  --  75  --   BILITOT 0.7  --  1.1  --   PROT 6.0*  --  6.5  --   ALBUMIN 2.0* 1.9* 1.9* 1.9*   CBC: Recent Labs  Lab 08/07/20 0425 08/08/20 0240 08/09/20 0150 08/10/20 0229 08/10/20 1930 08/11/20 0202 08/11/20 2233 08/12/20 0036 08/13/20 0208  WBC 13.1*   < > 13.1*   < > 18.3* 16.0* 16.1* 15.7* 14.5*  NEUTROABS 10.8*  --  11.1*  --   --   --  13.1*  --   --   HGB 7.5*   < >  7.7*   < > 9.7* 9.4* 9.3* 9.3* 9.0*  HCT 22.8*   < > 23.8*   < > 30.7* 30.4* 29.6* 28.0* 27.9*  MCV 90.8   < > 97.5   < > 92.2 94.7 92.8 92.1 93.0  PLT 340   < > 418*   < > 512* 568* 480* 447* 431*   < > = values in this interval not displayed.   CBG: Recent Labs  Lab 08/12/20 0739 08/12/20 1153 08/12/20 1653 08/12/20 2130 08/13/20 0755  GLUCAP 113* 104* 105* 134* 83    Lab Results  Component Value Date   INR 1.5 (H) 08/11/2020   INR 1.8 (H) 08/10/2020   INR 2.6 (H) 08/09/2020   Medications: . sodium chloride 250 mL (08/06/20 0342)  .  ceFAZolin (ANCEF) IV 1 g (08/12/20 1716)   .  aspirin EC  81 mg Oral Daily  . atorvastatin  80 mg Oral QHS  . Chlorhexidine Gluconate Cloth  6 each Topical Q0600  . Chlorhexidine Gluconate Cloth  6 each Topical Q0600  . clopidogrel  75 mg Oral Daily  . darbepoetin (ARANESP) injection - DIALYSIS  40 mcg Intravenous Q Sat-HD  . docusate sodium  100 mg Oral Daily  . dorzolamide-timolol  1 drop Right Eye BID  . finasteride  5 mg Oral Daily  . heparin injection (subcutaneous)  5,000 Units Subcutaneous Q8H  . insulin aspart  0-6 Units Subcutaneous TID WC  . insulin glargine  7 Units Subcutaneous QHS  . mupirocin ointment  1 application Nasal BID  . pantoprazole (PROTONIX) IV  40 mg Intravenous QHS  . polyethylene glycol  17 g Oral Daily  . sodium chloride flush  3 mL Intravenous Q12H    Dialysis Orders: TTS -[facility] 4hrs, BFR400, [id number], EDW 69kg,2K/2.25Ca Access:RU AVF Heparin4000 unit bolus Hectorol59mcg IV qHD  Assessment/Plan:  1. Sepsis 2/2 MSSA bacteremia. ID consulted,TEE negative for endocarditis. Suspected source gangrenous toes s/p TMA 08/11/20. BC from 1/27 negative.  Cefazolin until 08/18/20.   2. Syncopal episode -likely multifactorial - acute CVA, hypotension, and acute on chronic anemia. Not volume overloaded.No further syncope.Minimal UF with HD. 3. Acute CVA- seen on MRI. Thought to be related to  cerebral hypoperfusion with hypotension post HD, as this was the cause of a past stroke. Per neuro/Admit 4. R 1st & 2nd digit ulcerations/Hx PVD: S/p RLE balloon angioplasty to PTA 05/29/20.[facility] consulted, wentfor arteriogram01/28/22, followed by TMA on 08/11/20. High risk for BKA per Dr. Donzetta Matters due to operative findings. 5. ESRD:Continue TTS schedule. HD treatments shortened due to high patient census/staffing shortage. Not uremic or volume overloaded onexam. HD today per regular schedule.  6. Hypertension/volume: BPmostlycontrolled.Belowdry wt with no volume overload on exam. Likely losing some weight with NPO status/ nausea.  Lower dry on d/c.  7. Acute on chronic Anemiaof CKD:+ FOBT, Hgb drop 11.3 (on 1/13 as OP) to 7.4, s/p 1 unit pRBC1/20and another unit on 1/24.GI consulted,underwent EGD1/25which showed esophagitis.Hgb now9.0.Continue ESA. 8. Secondary Hyperparathyroidism:Corrected calciumcontrolled, has been running high,Phoswas low andbinders discontinued,last in goal 4.2. Hectorolon hold, follow Ca. 9. DMT2: per admitting team   Jen Mow, PA-C [facility] 08/13/2020,9:15 AM  LOS: 15 days   I have seen and examined this patient and agree with plan and assessment in the above note with renal recommendations/intervention highlighted.  [name] Jakob Kimberlin,MD 08/13/2020 11:22 AM

## 2020-08-13 NOTE — Progress Notes (Signed)
HD#15 Subjective:  Overnight: right sided CP improved with NTGx2 No EKG changes troponin 272>254  Patient endorses he is feeling fine this morning. He notes some right foot pain earlier today but denies any pain currently. He denies any chest pain, nausea/vomiting.  He notes that he was able to work with PT yesterday up to chair. Discussed recommendation for SNF but patient notes that he has PT that comes to the home. Discussed recommendation for SNF for which he is agreeable.   Objective:   Vital signs in last 24 hours: Vitals:   08/12/20 1925 08/12/20 1940 08/13/20 0340 08/13/20 0417  BP:  123/69 133/70 137/70  Pulse:  75  81  Resp: (!) 23 15 17 18   Temp:  99.4 F (37.4 C)  99.5 F (37.5 C)  TempSrc:  Oral  Oral  SpO2: 97% 97%    Weight:    62.3 kg  Height:       Physical Exam Constitutional:manlying bed, in no acute distress, appears comfortable Cardiovascular: RRR, no murmurs Pulmonary/Chest: CTA, no respiratory distress. MSK: L BKA, RLE dressed, no obvious bleeding Skin:warm, dry  Pertinent Labs: CBC Latest Ref Rng & Units 08/13/2020 08/12/2020 08/11/2020  WBC 4.0 - 10.5 K/uL 14.5(H) 15.7(H) 16.1(H)  Hemoglobin 13.0 - 17.0 g/dL 9.0(L) 9.3(L) 9.3(L)  Hematocrit 39.0 - 52.0 % 27.9(L) 28.0(L) 29.6(L)  Platelets 150 - 400 K/uL 431(H) 447(H) 480(H)    CMP Latest Ref Rng & Units 08/13/2020 08/12/2020 08/11/2020  Glucose 70 - 99 mg/dL 101(H) 120(H) 137(H)  BUN 8 - 23 mg/dL 31(H) 18 35(H)  Creatinine 0.61 - 1.24 mg/dL 5.66(H) 3.87(H) 5.77(H)  Sodium 135 - 145 mmol/L 138 138 138  Potassium 3.5 - 5.1 mmol/L 4.3 4.6 4.7  Chloride 98 - 111 mmol/L 97(L) 98 97(L)  CO2 22 - 32 mmol/L 27 24 24   Calcium 8.9 - 10.3 mg/dL 8.4(L) 8.2(L) 8.3(L)  Total Protein 6.5 - 8.1 g/dL - - -  Total Bilirubin 0.3 - 1.2 mg/dL - - -  Alkaline Phos 38 - 126 U/L - - -  AST 15 - 41 U/L - - -  ALT 0 - 44 U/L - - -    Imaging: ECHO TEE  Result Date: 08/08/2020   IMPRESSIONS  1. Left ventricular  ejection fraction, by estimation, is 60 to 65%. The left ventricle has normal function. The left ventricle has no regional wall motion abnormalities.   2. Right ventricular systolic function is normal. The right ventricular size is normal.   3. Left atrial size was mildly dilated. No left atrial/left atrial appendage thrombus was detected.   4. The mitral valve is normal in structure. Trivial mitral valve regurgitation. No evidence of mitral stenosis.   5. The aortic valve is normal in structure. Aortic valve regurgitation is not visualized. No aortic stenosis is present.   6. Aortic dilatation noted. There is borderline dilatation of the aortic root, measuring 38 mm. There is Moderate (Grade III) plaque involving the transverse and descending aorta.   7. Inadequate agitated saline injection to assess for interatrial shunt. Conclusion(s)/Recommendation(s): No evidence of vegetation/infective endocarditis on this transesophageal echocardiogram.      Assessment/Plan:   Principal Problem:   MSSA bacteremia Active Problems:   Diabetes mellitus with retinopathy of both eyes ([place])   Hyperlipidemia   Anemia   Chronic combined systolic and diastolic heart failure (HCC)   ESRD (end stage renal disease) ([place])   Acute cerebrovascular accident (CVA) due to ischemia ([place])  PAD (peripheral artery disease) (HCC)   Orthostatic hypotension   S/P BKA (below knee amputation) (HCC)   Syncope   Stroke ([place])   Dark stools   Gastroesophageal reflux disease with esophagitis   Nonerosive esophageal reflux disease   Hiatal hernia with GERD and esophagitis   Fever  Patient Summary:  Dennis Macias is a 70 y.o. man with history of ESRD on HD TTS, type 2 diabetes mellitus, history of CVA, hypertension, heart failure, and peripheral vascular disease on dual antiplatelet therapy who presented after syncopal episode at dialysis and admitted for MSSA bacteremia in the setting of R1st and F2nd digit ulcerations.    This is hospital day 15.  Sepsis secondary to MSSA bacteremia R 1st and 2nd digit ulcerations History of Peripheral vascular disease s/p L BKA S/p angioplasty of the R posterior tibial artery POD 3 (1/28) Patient no pain after surgery. S/p TMA post op day 2. Remains afebrile. WBC improving 14.5 today. Appears to be healing well, although high risk for BKA due to poor perfusion RLE. - Vascular consulted, appreciate recommendations  - ID consulted, appreciate reccomendations - Dilaudid 0.5 mg IV q4  - IV cefazolin day 10 /14  (day 1: 1/27, last day 2/9), - On dual antiplatelet platelet therapy due to PVD.  - PT/OT: SNF, TOC recs pending  Atypical CP likely 2/2 NSTEMI No further chest pain today. EKG without acute changes. Troponin up to 300 in setting of ESRD. Will order troponin if he has more chest pain. -Continue to monitor  Syncopal episode Post-HD hypotension ESRD on HD (TTS) Acute ischemic left thalamic infarct History of multiple prior CVAs (2017, 2021) HTN, HLD No further syncope. Getting shorten sessions of HD. Continue with HD on TTS schedule - Plan for HD today - Continue ASA and Plavix - Continue atorvastatin 80 mg  - Continued HD while hospitalized. Appreciate nephrology's assistance.   Acute on chronic anemia FOBT positive Nonerosive esophagitis Hgb of 9 stable.  - Protonix 40 mg daily  - monitor CBC  Type 2 diabetes mellitus - Lantus 7 units - SSI sensitive - Carb/renal diet   Diet: renal/carb IVF: None VTE: Heparin Code: Full PT/OT recs: SNF for Subacute PT   Please contact the on call pager after 5 pm and on weekends at [phone].  Iona Beard, MD PGY-1 Internal Medicine Teaching Service Pager: [phone] 08/13/2020

## 2020-08-13 NOTE — Progress Notes (Signed)
[facility]  Subjective  - no complaints.   Objective 130/65 75 98.6 F (37 C) (Oral) 15 100% No intake or output data in the 24 hours ending 08/13/20 1009      Laboratory Lab Results: Recent Labs    08/12/20 0036 08/13/20 0208  WBC 15.7* 14.5*  HGB 9.3* 9.0*  HCT 28.0* 27.9*  PLT 447* 431*   BMET Recent Labs    08/12/20 0036 08/13/20 0208  NA 138 138  K 4.6 4.3  CL 98 97*  CO2 24 27  GLUCOSE 120* 101*  BUN 18 31*  CREATININE 3.87* 5.66*  CALCIUM 8.2* 8.4*    COAG Lab Results  Component Value Date   INR 1.5 (H) 08/11/2020   INR 1.8 (H) 08/10/2020   INR 2.6 (H) 08/09/2020   No results found for: PTT  Assessment/Planning:  Dressing removed from the right TMA this morning and right now this looks okay but will be very high risk for needing more proximal amputation given there was minimal bleeding at the time of amputation.  Vascular will follow.  Marty Heck 08/13/2020 10:09 AM --

## 2020-08-14 DIAGNOSIS — E1122 Type 2 diabetes mellitus with diabetic chronic kidney disease: Secondary | ICD-10-CM | POA: Diagnosis not present

## 2020-08-14 DIAGNOSIS — R55 Syncope and collapse: Secondary | ICD-10-CM

## 2020-08-14 DIAGNOSIS — A4101 Sepsis due to Methicillin susceptible Staphylococcus aureus: Secondary | ICD-10-CM

## 2020-08-14 DIAGNOSIS — B9561 Methicillin susceptible Staphylococcus aureus infection as the cause of diseases classified elsewhere: Secondary | ICD-10-CM | POA: Diagnosis not present

## 2020-08-14 DIAGNOSIS — I959 Hypotension, unspecified: Secondary | ICD-10-CM

## 2020-08-14 LAB — CBC
HCT: 30.6 % — ABNORMAL LOW (ref 39.0–52.0)
Hemoglobin: 9.4 g/dL — ABNORMAL LOW (ref 13.0–17.0)
MCH: 29.1 pg (ref 26.0–34.0)
MCHC: 30.7 g/dL (ref 30.0–36.0)
MCV: 94.7 fL (ref 80.0–100.0)
Platelets: 418 10*3/uL — ABNORMAL HIGH (ref 150–400)
RBC: 3.23 MIL/uL — ABNORMAL LOW (ref 4.22–5.81)
RDW: 14.8 % (ref 11.5–15.5)
WBC: 18.4 10*3/uL — ABNORMAL HIGH (ref 4.0–10.5)
nRBC: 0 % (ref 0.0–0.2)

## 2020-08-14 LAB — GLUCOSE, CAPILLARY
Glucose-Capillary: 114 mg/dL — ABNORMAL HIGH (ref 70–99)
Glucose-Capillary: 106 mg/dL — ABNORMAL HIGH (ref 70–99)
Glucose-Capillary: 120 mg/dL — ABNORMAL HIGH (ref 70–99)
Glucose-Capillary: 127 mg/dL — ABNORMAL HIGH (ref 70–99)

## 2020-08-14 LAB — BASIC METABOLIC PANEL
Anion gap: 18 — ABNORMAL HIGH (ref 5–15)
BUN: 21 mg/dL (ref 8–23)
CO2: 24 mmol/L (ref 22–32)
Calcium: 7.9 mg/dL — ABNORMAL LOW (ref 8.9–10.3)
Chloride: 93 mmol/L — ABNORMAL LOW (ref 98–111)
Creatinine, Ser: 4.31 mg/dL — ABNORMAL HIGH (ref 0.61–1.24)
GFR, Estimated: 14 mL/min — ABNORMAL LOW (ref >60)
Glucose, Bld: 126 mg/dL — ABNORMAL HIGH (ref 70–99)
Potassium: 3.9 mmol/L (ref 3.5–5.1)
Sodium: 135 mmol/L (ref 135–145)

## 2020-08-14 MED ORDER — CHLORHEXIDINE GLUCONATE CLOTH 2 % EX PADS
6.0000 | MEDICATED_PAD | Freq: Every day | CUTANEOUS | Status: DC
  Administered 2020-08-14 – 2020-08-17 (×2): 6 via TOPICAL

## 2020-08-14 NOTE — Progress Notes (Addendum)
[facility] Progress Note   Subjective:   Patient seen and examined at bedside.  Reports nausea again this morning.  Denies CP, SOB, abdominal pain, foot pain, vomiting or diarrhea.  States dialysis went well yesterday.   Objective Vitals:   08/13/20 2000 08/13/20 2005 08/13/20 2019 08/14/20 0700  BP: 104/72 113/66 132/71 113/61  Pulse: 77 73 79 79  Resp: 16 15 17 15   Temp:  (!) 97.4 F (36.3 C) 97.6 F (36.4 C) 97.6 F (36.4 C)  TempSrc:  Oral Oral Oral  SpO2:  94% 98% 99%  Weight:    62.4 kg  Height:       Physical Exam General:chronically ill appearing male in NAD Heart:RRR, no mrg Lungs:CTAB, nml WOB on RA Abdomen:soft, NTND Extremities:no LE edema, L BKA, R TMA with sutures intact Dialysis Access: RU AVF +b/t   Filed Weights   08/13/20 0417 08/13/20 1722 08/14/20 0700  Weight: 62.3 kg 63.7 kg 62.4 kg    Intake/Output Summary (Last 24 hours) at 08/14/2020 0947 Last data filed at 08/14/2020 0945 Gross per 24 hour  Intake 180 ml  Output 1500 ml  Net -1320 ml    Additional Objective Labs: Basic Metabolic Panel: Recent Labs  Lab 08/09/20 0205 08/10/20 0229 08/11/20 0202 08/11/20 0853 08/12/20 0036 08/13/20 0208 08/14/20 0310  NA 138 139   < > 138 138 138 135  K 4.2 4.1   < > 4.7 4.6 4.3 3.9  CL 103 97*   < > 97* 98 97* 93*  CO2 19* 27   < > 24 24 27 24   GLUCOSE 108* 120*   < > 137* 120* 101* 126*  BUN 41* 18   < > 35* 18 31* 21  CREATININE 5.46* 3.48*   < > 5.77* 3.87* 5.66* 4.31*  CALCIUM 8.3* 8.2*   < > 8.3* 8.2* 8.4* 7.9*  PHOS 3.8 2.4*  --  4.2  --   --   --    < > = values in this interval not displayed.   Liver Function Tests: Recent Labs  Lab 08/09/20 1121 08/10/20 0229 08/11/20 0202 08/11/20 0853  AST 17  --  17  --   ALT <5  --  <5  --   ALKPHOS 72  --  75  --   BILITOT 0.7  --  1.1  --   PROT 6.0*  --  6.5  --   ALBUMIN 2.0* 1.9* 1.9* 1.9*   CBC: Recent Labs  Lab 08/09/20 0150 08/10/20 0229 08/11/20 0202  08/11/20 2233 08/12/20 0036 08/13/20 0208 08/14/20 0310  WBC 13.1*   < > 16.0* 16.1* 15.7* 14.5* 18.4*  NEUTROABS 11.1*  --   --  13.1*  --   --   --   HGB 7.7*   < > 9.4* 9.3* 9.3* 9.0* 9.4*  HCT 23.8*   < > 30.4* 29.6* 28.0* 27.9* 30.6*  MCV 97.5   < > 94.7 92.8 92.1 93.0 94.7  PLT 418*   < > 568* 480* 447* 431* 418*   < > = values in this interval not displayed.   CBG: Recent Labs  Lab 08/13/20 0755 08/13/20 1108 08/13/20 1647 08/13/20 2122 08/14/20 0744  GLUCAP 83 104* 92 108* 114*    Medications: . sodium chloride 250 mL (08/06/20 0342)  .  ceFAZolin (ANCEF) IV 1 g (08/13/20 2205)   . aspirin EC  81 mg Oral Daily  . atorvastatin  80 mg Oral QHS  .  Chlorhexidine Gluconate Cloth  6 each Topical Q0600  . clopidogrel  75 mg Oral Daily  . darbepoetin (ARANESP) injection - DIALYSIS  40 mcg Intravenous Q Sat-HD  . docusate sodium  100 mg Oral Daily  . dorzolamide-timolol  1 drop Right Eye BID  . finasteride  5 mg Oral Daily  . heparin injection (subcutaneous)  5,000 Units Subcutaneous Q8H  . insulin aspart  0-6 Units Subcutaneous TID WC  . insulin glargine  7 Units Subcutaneous QHS  . pantoprazole (PROTONIX) IV  40 mg Intravenous QHS  . polyethylene glycol  17 g Oral Daily  . sodium chloride flush  3 mL Intravenous Q12H    Dialysis Orders: TTS -[facility] 4hrs, BFR400, [id number], EDW 69kg,2K/2.25Ca Access:RU AVF Heparin4000 unit bolus Hectorol38mcg IV qHD  Assessment/Plan:  1. Sepsis 2/2 MSSA bacteremia. ID consulted,TEE negative for endocarditis. Suspected source gangrenous toess/p TMA 08/11/20.BC from 1/27 negative.  Cefazolin until 08/18/20.   2. Syncopal episode -likely multifactorial - acute CVA, hypotension, and acute on chronic anemia. Not volume overloaded.No further syncope.Minimal UF with HD. 3. Acute CVA- seen on MRI. Thought to be related to cerebral hypoperfusion with hypotension post HD, as this was the cause of a past stroke. Per  neuro/Admit 4. R 1st & 2nd digit ulcerations/Hx PVD: S/p RLE balloon angioplasty to PTA 05/29/20.VVS consulted, wentfor arteriogram01/28/22, followed by TMA on 08/11/20. High risk for BKA per Dr. Donzetta Matters due to operative findings. 5. ESRD:Continue TTS schedule. HD treatments shortened due to high patient census/staffing shortage. Not uremic or volume overloaded onexam.Next HD on 08/16/20 6. Hypertension/volume: BPmostlycontrolled.Belowdry wt with no volume overload on exam. Likely losing some weight with NPO status/ nausea. Lower dry on d/c. UF as tolerated, keep SBP>100. 7. Acute on chronic Anemiaof CKD:+ FOBT, Hgb drop 11.3 (on 1/13 as OP) to 7.4, s/p 1 unit pRBC1/20and another unit on 1/24.GI consulted,underwent EGD1/25which showed esophagitis.Hgb now9.4.Continue ESA. 8. Secondary Hyperparathyroidism:Corrected calciumcontrolled, has been running high,Phoswas low andbinders discontinued,last in goal 4.2.Hectorolon hold, follow Ca. 9. DMT2: per admitting team   Jen Mow, PA-C [facility] 08/14/2020,9:47 AM  LOS: 16 days   I have seen and examined this patient and agree with plan and assessment in the above note with renal recommendations/intervention highlighted.  [name] Tamaka Sawin,MD 08/14/2020 12:08 PM

## 2020-08-14 NOTE — Progress Notes (Signed)
HD#16 Subjective:  Overnight: None  Patient endorses he is feeling "fair" this morning. Denies chest pain or pain in right foot at this time.  Has not had breakfast yet. Notes he is saving it for later.   Objective:   Vital signs in last 24 hours: Vitals:   08/13/20 1930 08/13/20 2000 08/13/20 2005 08/13/20 2019  BP: (!) 144/97 104/72 113/66 132/71  Pulse: 94 77 73 79  Resp: 15 16 15 17   Temp:   (!) 97.4 F (36.3 C) 97.6 F (36.4 C)  TempSrc:   Oral Oral  SpO2:   94% 98%  Weight:      Height:       Physical Exam Constitutional:manlying bed, in no acute distress, appears comfortable Cardiovascular: RRR, no murmurs Pulmonary/Chest: CTA, no respiratory distress. MSK: L BKA, RLE surgical wound without bleeding, cool to touch, no signs of local infection Skin:warm, dry  Pertinent Labs: CBC Latest Ref Rng & Units 08/14/2020 08/13/2020 08/12/2020  WBC 4.0 - 10.5 K/uL 18.4(H) 14.5(H) 15.7(H)  Hemoglobin 13.0 - 17.0 g/dL 9.4(L) 9.0(L) 9.3(L)  Hematocrit 39.0 - 52.0 % 30.6(L) 27.9(L) 28.0(L)  Platelets 150 - 400 K/uL 418(H) 431(H) 447(H)    CMP Latest Ref Rng & Units 08/14/2020 08/13/2020 08/12/2020  Glucose 70 - 99 mg/dL 126(H) 101(H) 120(H)  BUN 8 - 23 mg/dL 21 31(H) 18  Creatinine 0.61 - 1.24 mg/dL 4.31(H) 5.66(H) 3.87(H)  Sodium 135 - 145 mmol/L 135 138 138  Potassium 3.5 - 5.1 mmol/L 3.9 4.3 4.6  Chloride 98 - 111 mmol/L 93(L) 97(L) 98  CO2 22 - 32 mmol/L 24 27 24   Calcium 8.9 - 10.3 mg/dL 7.9(L) 8.4(L) 8.2(L)  Total Protein 6.5 - 8.1 g/dL - - -  Total Bilirubin 0.3 - 1.2 mg/dL - - -  Alkaline Phos 38 - 126 U/L - - -  AST 15 - 41 U/L - - -  ALT 0 - 44 U/L - - -    Imaging: ECHO TEE  Result Date: 08/08/2020   IMPRESSIONS  1. Left ventricular ejection fraction, by estimation, is 60 to 65%. The left ventricle has normal function. The left ventricle has no regional wall motion abnormalities.   2. Right ventricular systolic function is normal. The right ventricular size  is normal.   3. Left atrial size was mildly dilated. No left atrial/left atrial appendage thrombus was detected.   4. The mitral valve is normal in structure. Trivial mitral valve regurgitation. No evidence of mitral stenosis.   5. The aortic valve is normal in structure. Aortic valve regurgitation is not visualized. No aortic stenosis is present.   6. Aortic dilatation noted. There is borderline dilatation of the aortic root, measuring 38 mm. There is Moderate (Grade III) plaque involving the transverse and descending aorta.   7. Inadequate agitated saline injection to assess for interatrial shunt. Conclusion(s)/Recommendation(s): No evidence of vegetation/infective endocarditis on this transesophageal echocardiogram.      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 man with history of ESRD on HD TTS, type 2 diabetes mellitus, history of CVA, hypertension, heart failure, and peripheral vascular disease on dual antiplatelet therapy who presented after syncopal episode at dialysis and admitted for MSSA bacteremia in the setting of R1st and F2nd digit ulcerations.   This is hospital day 16.  Sepsis secondary to MSSA bacteremia R 1st and 2nd digit ulcerations History of Peripheral vascular disease s/p L BKA S/p angioplasty of the R posterior tibial artery POD 3 (1/28) Patient no pain  after surgery. S/p TMA post op day 3. Right foot cool to touch but surgical wound appears to be healing. Remains afebrile. WBC improving 18.4 today.  - Vascular consulted, appreciate recommendations  - ID consulted, appreciate reccomendations - Dilaudid 0.5 mg IV q4  - IV cefazolin day 11 /14  (day 1: 1/27, last day 2/10), - On dual antiplatelet platelet therapy due to PVD.  - PT/OT: SNF, pending placement  Syncopal episode Post-HD hypotension ESRD on HD (TTS) Acute ischemic left thalamic infarct History of multiple prior CVAs (2017, 2021) HTN, HLD No further syncope. Getting shorten sessions of HD. Continue with HD on TTS schedule - Plan for HD today - Continue ASA and Plavix - Continue atorvastatin 80 mg  - Continued HD while hospitalized. Appreciate nephrology's assistance.   Acute on chronic anemia FOBT positive Nonerosive esophagitis Hgb of 9.4 stable.  - Protonix 40 mg daily  -Continue ESA - monitor CBC  Type 2 diabetes mellitus - Lantus 7 units - SSI sensitive - Carb/renal diet   Diet: renal/carb IVF: None VTE: Heparin Code: Full PT/OT recs: SNF for Subacute PT   Please contact the on call pager after 5 pm and on weekends at [phone].  Iona Beard, MD PGY-1 Internal Medicine Teaching Service Pager: [phone] 08/14/2020

## 2020-08-14 NOTE — Progress Notes (Signed)
[facility] [place]  Subjective  - no complaints.   Objective 113/61 79 97.6 F (36.4 C) (Oral) 15 99%  Intake/Output Summary (Last 24 hours) at 08/14/2020 1150 Last data filed at 08/14/2020 0945 Gross per 24 hour  Intake 180 ml  Output 1500 ml  Net -1320 ml    Right TMA intact, no drainage, looks viable    Laboratory Lab Results: Recent Labs    08/13/20 0208 08/14/20 0310  WBC 14.5* 18.4*  HGB 9.0* 9.4*  HCT 27.9* 30.6*  PLT 431* 418*   BMET Recent Labs    08/13/20 0208 08/14/20 0310  NA 138 135  K 4.3 3.9  CL 97* 93*  CO2 27 24  GLUCOSE 101* 126*  BUN 31* 21  CREATININE 5.66* 4.31*  CALCIUM 8.4* 7.9*    COAG Lab Results  Component Value Date   INR 1.5 (H) 08/11/2020   INR 1.8 (H) 08/10/2020   INR 2.6 (H) 08/09/2020   No results found for: PTT  Assessment/Planning:  Right TMA continues to look viable but will be very high risk for needing more proximal amputation given there was minimal bleeding at the time of amputation.  Vascular will follow.  Marty Heck 08/14/2020 11:50 AM --

## 2020-08-15 ENCOUNTER — Other Ambulatory Visit: Payer: Self-pay

## 2020-08-15 LAB — CBC
HCT: 31.5 % — ABNORMAL LOW (ref 39.0–52.0)
Hemoglobin: 10.1 g/dL — ABNORMAL LOW (ref 13.0–17.0)
MCH: 29.9 pg (ref 26.0–34.0)
MCHC: 32.1 g/dL (ref 30.0–36.0)
MCV: 93.2 fL (ref 80.0–100.0)
Platelets: 489 10*3/uL — ABNORMAL HIGH (ref 150–400)
RBC: 3.38 MIL/uL — ABNORMAL LOW (ref 4.22–5.81)
RDW: 14.8 % (ref 11.5–15.5)
WBC: 15.3 10*3/uL — ABNORMAL HIGH (ref 4.0–10.5)
nRBC: 0 % (ref 0.0–0.2)

## 2020-08-15 LAB — BASIC METABOLIC PANEL
Anion gap: 19 — ABNORMAL HIGH (ref 5–15)
BUN: 32 mg/dL — ABNORMAL HIGH (ref 8–23)
CO2: 24 mmol/L (ref 22–32)
Calcium: 8.4 mg/dL — ABNORMAL LOW (ref 8.9–10.3)
Chloride: 93 mmol/L — ABNORMAL LOW (ref 98–111)
Creatinine, Ser: 6.15 mg/dL — ABNORMAL HIGH (ref 0.61–1.24)
GFR, Estimated: 9 mL/min — ABNORMAL LOW (ref >60)
Glucose, Bld: 127 mg/dL — ABNORMAL HIGH (ref 70–99)
Potassium: 4.2 mmol/L (ref 3.5–5.1)
Sodium: 136 mmol/L (ref 135–145)

## 2020-08-15 LAB — GLUCOSE, CAPILLARY
Glucose-Capillary: 102 mg/dL — ABNORMAL HIGH (ref 70–99)
Glucose-Capillary: 87 mg/dL (ref 70–99)
Glucose-Capillary: 208 mg/dL — ABNORMAL HIGH (ref 70–99)
Glucose-Capillary: 206 mg/dL — ABNORMAL HIGH (ref 70–99)

## 2020-08-15 LAB — SARS CORONAVIRUS 2 (TAT 6-24 HRS): SARS Coronavirus 2: NEGATIVE

## 2020-08-15 MED ORDER — PANTOPRAZOLE SODIUM 40 MG PO TBEC
40.0000 mg | DELAYED_RELEASE_TABLET | Freq: Every day | ORAL | Status: DC
  Administered 2020-08-15 – 2020-08-19 (×5): 40 mg via ORAL
  Filled 2020-08-15 (×5): qty 1

## 2020-08-15 MED ORDER — ATORVASTATIN CALCIUM 80 MG PO TABS
80.0000 mg | ORAL_TABLET | Freq: Every day | ORAL | Status: DC
Start: 2020-08-15 — End: 2020-08-19
  Administered 2020-08-15 – 2020-08-19 (×5): 80 mg via ORAL
  Filled 2020-08-15 (×5): qty 1

## 2020-08-15 NOTE — Progress Notes (Signed)
HD#17 Subjective:  Overnight: None  [name] was evaluated at bedside this morning. He is resting comfortably in bed this morning. He denies any pain or nausea/vomiting at this time.  Patient will discuss with his girlfriend regarding options for rehab facilities.   Objective:   Vital signs in last 24 hours: Vitals:   08/13/20 2019 08/14/20 0700 08/14/20 1400 08/15/20 0419  BP: 132/71 113/61 116/72 108/60  Pulse: 79 79 79 72  Resp: 17 15  16   Temp: 97.6 F (36.4 C) 97.6 F (36.4 C) 98.2 F (36.8 C) 97.6 F (36.4 C)  TempSrc: Oral Oral Oral Oral  SpO2: 98% 99% 98% 99%  Weight:  62.4 kg  63 kg  Height:       Physical Exam Constitutional:manlying bed, in no acute distress, appears comfortable Cardiovascular: RRR, no murmurs Pulmonary/Chest: CTA, no respiratory distress. MSK: L BKA, RLE surgical wound without bleeding, cool to touch, no signs of local infection Skin:warm, dry  Pertinent Labs: CBC Latest Ref Rng & Units 08/15/2020 08/14/2020 08/13/2020  WBC 4.0 - 10.5 K/uL 15.3(H) 18.4(H) 14.5(H)  Hemoglobin 13.0 - 17.0 g/dL 10.1(L) 9.4(L) 9.0(L)  Hematocrit 39.0 - 52.0 % 31.5(L) 30.6(L) 27.9(L)  Platelets 150 - 400 K/uL 489(H) 418(H) 431(H)    CMP Latest Ref Rng & Units 08/15/2020 08/14/2020 08/13/2020  Glucose 70 - 99 mg/dL 127(H) 126(H) 101(H)  BUN 8 - 23 mg/dL 32(H) 21 31(H)  Creatinine 0.61 - 1.24 mg/dL 6.15(H) 4.31(H) 5.66(H)  Sodium 135 - 145 mmol/L 136 135 138  Potassium 3.5 - 5.1 mmol/L 4.2 3.9 4.3  Chloride 98 - 111 mmol/L 93(L) 93(L) 97(L)  CO2 22 - 32 mmol/L 24 24 27   Calcium 8.9 - 10.3 mg/dL 8.4(L) 7.9(L) 8.4(L)  Total Protein 6.5 - 8.1 g/dL - - -  Total Bilirubin 0.3 - 1.2 mg/dL - - -  Alkaline Phos 38 - 126 U/L - - -  AST 15 - 41 U/L - - -  ALT 0 - 44 U/L - - -    Imaging: ECHO TEE  Result Date: 08/08/2020   IMPRESSIONS  1. Left ventricular ejection fraction, by estimation, is 60 to 65%. The left ventricle has normal function. The left ventricle  has no regional wall motion abnormalities.   2. Right ventricular systolic function is normal. The right ventricular size is normal.   3. Left atrial size was mildly dilated. No left atrial/left atrial appendage thrombus was detected.   4. The mitral valve is normal in structure. Trivial mitral valve regurgitation. No evidence of mitral stenosis.   5. The aortic valve is normal in structure. Aortic valve regurgitation is not visualized. No aortic stenosis is present.   6. Aortic dilatation noted. There is borderline dilatation of the aortic root, measuring 38 mm. There is Moderate (Grade III) plaque involving the transverse and descending aorta.   7. Inadequate agitated saline injection to assess for interatrial shunt. Conclusion(s)/Recommendation(s): No evidence of vegetation/infective endocarditis on this transesophageal echocardiogram.      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 man with history of ESRD on HD TTS, type 2 diabetes mellitus, history of CVA, hypertension, heart failure, and peripheral vascular disease on dual antiplatelet therapy who presented after syncopal episode at dialysis and admitted for MSSA bacteremia in the setting of R1st and F2nd digit ulcerations.   This is hospital day 17.  Sepsis secondary to MSSA bacteremia R 1st and 2nd digit ulcerations History of  Peripheral vascular disease s/p L BKA S/p angioplasty of the R posterior tibial artery POD 3 (1/28) Patient reports pain is well controlled. S/p TMA post op day 4. Healing well. No signs of infection. Cleared for discharge by vascular.  - Vascular consulted, appreciate recommendations  - ID consulted, appreciate reccomendations - Dilaudid 0.5 mg IV q4  - IV cefazolin day 11 /14  (day 1: 1/27, last day 2/10), - On dual antiplatelet platelet therapy due to PVD.  - R heel weightbearing only with Darco shoe - PT/OT: SNF, pending placement  Atypical CP Patient with CP intermittently during admission. Prior troponins flat without changes of EKG.. No new chest pain today. Tele with arlams for STE in III and avF similar to prior episodes.  - Repeat EKG  Syncopal episode Post-HD hypotension ESRD on HD (TTS) Acute ischemic left thalamic infarct History of multiple prior CVAs (2017, 2021) HTN, HLD No further syncope. Getting shorten sessions of HD. Continue with HD on TTS schedule - Plan for HD today - Continue ASA and Plavix - Continue atorvastatin 80 mg  - Continued HD while hospitalized. Appreciate nephrology's assistance.   Acute on chronic anemia FOBT positive Nonerosive esophagitis Hgb of 9.4 stable.  - Protonix 40 mg daily  -Continue ESA - monitor CBC  Type 2 diabetes mellitus - Lantus 7 units - SSI sensitive - Carb/renal diet   Diet: renal/carb IVF: None VTE: Heparin Code: Full PT/OT recs: SNF for Subacute PT   Please contact the on call pager after 5 pm and on weekends at [phone].  Iona Beard, MD PGY-1 Internal Medicine Teaching Service Pager: [phone] 08/15/2020

## 2020-08-15 NOTE — Progress Notes (Addendum)
  Progress Note    08/15/2020 8:00 AM 4 Days Post-Op  Subjective:  No complaints   Vitals:   08/14/20 1400 08/15/20 0419  BP: 116/72 108/60  Pulse: 79 72  Resp:  16  Temp: 98.2 F (36.8 C) 97.6 F (36.4 C)  SpO2: 98% 99%   Physical Exam: Lungs:  Non labored Incisions:  R TMA with viable skin edges and flap Extremities:  Foot warm to touch Neurologic: A&O  CBC    Component Value Date/Time   WBC 15.3 (H) 08/15/2020 0140   RBC 3.38 (L) 08/15/2020 0140   HGB 10.1 (L) 08/15/2020 0140   HGB 11.1 (L) 05/02/2020 1454   HCT 31.5 (L) 08/15/2020 0140   HCT 35.5 (L) 05/02/2020 1454   PLT 489 (H) 08/15/2020 0140   PLT 221 05/02/2020 1454   MCV 93.2 08/15/2020 0140   MCV 86 05/02/2020 1454   MCH 29.9 08/15/2020 0140   MCHC 32.1 08/15/2020 0140   RDW 14.8 08/15/2020 0140   RDW 15.7 (H) 05/02/2020 1454   LYMPHSABS 1.8 08/11/2020 2233   LYMPHSABS 1.3 05/02/2020 1454   MONOABS 0.9 08/11/2020 2233   EOSABS 0.0 08/11/2020 2233   EOSABS 0.0 05/02/2020 1454   BASOSABS 0.0 08/11/2020 2233   BASOSABS 0.0 05/02/2020 1454    BMET    Component Value Date/Time   NA 136 08/15/2020 0140   NA 137 12/10/2019 1439   K 4.2 08/15/2020 0140   CL 93 (L) 08/15/2020 0140   CO2 24 08/15/2020 0140   GLUCOSE 127 (H) 08/15/2020 0140   BUN 32 (H) 08/15/2020 0140   BUN 80 (HH) 12/10/2019 1439   CREATININE 6.15 (H) 08/15/2020 0140   CALCIUM 8.4 (L) 08/15/2020 0140   GFRNONAA 9 (L) 08/15/2020 0140   GFRAA 19 (L) 04/05/2020 0705    INR    Component Value Date/Time   INR 1.5 (H) 08/11/2020 0202     Intake/Output Summary (Last 24 hours) at 08/15/2020 0800 Last data filed at 08/14/2020 2145 Gross per 24 hour  Intake 180 ml  Output --  Net 180 ml     Assessment/Plan:  70 y.o. male is s/p R TMA 4 Days Post-Op   R TMA with viable flap and skin edges R heel weightbearing only with Darco shoe Ok for d/c when bed available    Dagoberto Ligas, PA-C [facility] [phone] 08/15/2020 8:00 AM

## 2020-08-15 NOTE — Progress Notes (Addendum)
Subjective: Awoken from sleep seen in room and examined no complaints for dialysis tomorrow and schedule  Objective Vital signs in last 24 hours: Vitals:   08/13/20 2019 08/14/20 0700 08/14/20 1400 08/15/20 0419  BP: 132/71 113/61 116/72 108/60  Pulse: 79 79 79 72  Resp: 17 15  16   Temp: 97.6 F (36.4 C) 97.6 F (36.4 C) 98.2 F (36.8 C) 97.6 F (36.4 C)  TempSrc: Oral Oral Oral Oral  SpO2: 98% 99% 98% 99%  Weight:  62.4 kg  63 kg  Height:       Weight change: -0.65 kg  Physical Exam General: Alert, chronically ill appearing male in NAD Heart:RRR, no mrg Lungs:CTAB, unlabored breathing on RA Abdomen: Bowel sounds normoactive soft, NTND Extremities:no LE edema, L BKA, R TMA with sutures intact Dialysis Access: RU AVF +b/t   Dialysis Orders: TTS -[facility] 4hrs, BFR400, [id number], EDW 69kg,2K/2.25Ca Access:RU AVF Heparin4000 unit bolus Hectorol23mcg IV qHD  Problem/Plan: 1. Sepsis 2/2 MSSA bacteremia. ID consulted,TEE negative for endocarditis. Suspected source gangrenous toess/p TMA 08/11/20.BC from 1/27 negative. Cefazolin until 08/18/20.  2. Syncopal episode -likely multifactorial - acute CVA, hypotension, and acute on chronic anemia. Not volume overloaded.No further syncope.Minimal UF with HD. 3. Acute CVA- seen on MRI. Thought to be related to cerebral hypoperfusion with hypotension post HD, as this was the cause of a past stroke. Per neuro/Admit 4. R 1st & 2nd digit ulcerations/Hx PVD: S/p RLE balloon angioplasty to PTA 05/29/20.VVS consulted, wentfor arteriogram01/28/22, followed by TMA on 08/11/20. High risk for BKA per Dr. Hartley Barefoot to operative findings. 5. ESRD:Continue TTS schedule. HD treatments shortened due to high patient census/staffing shortage. Not uremic or volume overloaded onexam.Next HD on 08/16/20 6. Hypertension/volume: BPmostlycontrolled.Belowdry wt with no volume overload on exam. Losing some weight with sepsis, NPO  status/nausea. Lower dry on d/c. UF as tolerated, keep SBP>100. 7. Acute on chronic Anemiaof CKD:+ FOBT, Hgb drop 11.3 (on 1/13 as OP) but this a.m. 10.1, after s/p 1 unit pRBC1/20and another unit on 1/24.GI consulted,underwent EGD1/25which showed esophagitis.Continue ESA. 8. Secondary Hyperparathyroidism:Corrected calciumcontrolled, has been running high,Phos 4.2 andbinders discontinued,Hectorolon hold, follow Ca. 9. DMT2: per admitting team   Dennis Haber, PA-C [place] [phone] 08/15/2020,10:06 AM  LOS: 17 days   Dennis Splinter, MD 08/15/2020, 5:25 PM      Labs: Basic Metabolic Panel: Recent Labs  Lab 08/09/20 0205 08/10/20 0229 08/11/20 0202 08/11/20 5329 08/12/20 0036 08/13/20 0208 08/14/20 0310 08/15/20 0140  NA 138 139   < > 138   < > 138 135 136  K 4.2 4.1   < > 4.7   < > 4.3 3.9 4.2  CL 103 97*   < > 97*   < > 97* 93* 93*  CO2 19* 27   < > 24   < > 27 24 24   GLUCOSE 108* 120*   < > 137*   < > 101* 126* 127*  BUN 41* 18   < > 35*   < > 31* 21 32*  CREATININE 5.46* 3.48*   < > 5.77*   < > 5.66* 4.31* 6.15*  CALCIUM 8.3* 8.2*   < > 8.3*   < > 8.4* 7.9* 8.4*  PHOS 3.8 2.4*  --  4.2  --   --   --   --    < > = values in this interval not displayed.   Liver Function Tests: Recent Labs  Lab 08/09/20 1121 08/10/20 0229 08/11/20 0202 08/11/20 9242  AST 17  --  17  --   ALT <5  --  <5  --   ALKPHOS 72  --  75  --   BILITOT 0.7  --  1.1  --   PROT 6.0*  --  6.5  --   ALBUMIN 2.0* 1.9* 1.9* 1.9*   No results for input(s): LIPASE, AMYLASE in the last 168 hours. No results for input(s): AMMONIA in the last 168 hours. CBC: Recent Labs  Lab 08/09/20 0150 08/10/20 0229 08/11/20 2233 08/12/20 0036 08/13/20 0208 08/14/20 0310 08/15/20 0140  WBC 13.1*   < > 16.1* 15.7* 14.5* 18.4* 15.3*  NEUTROABS 11.1*  --  13.1*  --   --   --   --   HGB 7.7*   < > 9.3* 9.3* 9.0* 9.4* 10.1*  HCT 23.8*   < > 29.6* 28.0* 27.9* 30.6*  31.5*  MCV 97.5   < > 92.8 92.1 93.0 94.7 93.2  PLT 418*   < > 480* 447* 431* 418* 489*   < > = values in this interval not displayed.   Cardiac Enzymes: No results for input(s): CKTOTAL, CKMB, CKMBINDEX, TROPONINI in the last 168 hours. CBG: Recent Labs  Lab 08/14/20 0744 08/14/20 1206 08/14/20 1651 08/14/20 2152 08/15/20 0745  GLUCAP 114* 106* 120* 127* 102*    Studies/Results: No results found. Medications: . sodium chloride 250 mL (08/06/20 0342)  .  ceFAZolin (ANCEF) IV 1 g (08/14/20 1642)   . aspirin EC  81 mg Oral Daily  . atorvastatin  80 mg Oral Daily  . Chlorhexidine Gluconate Cloth  6 each Topical Q0600  . clopidogrel  75 mg Oral Daily  . darbepoetin (ARANESP) injection - DIALYSIS  40 mcg Intravenous Q Sat-HD  . docusate sodium  100 mg Oral Daily  . dorzolamide-timolol  1 drop Right Eye BID  . finasteride  5 mg Oral Daily  . heparin injection (subcutaneous)  5,000 Units Subcutaneous Q8H  . insulin aspart  0-6 Units Subcutaneous TID WC  . insulin glargine  7 Units Subcutaneous QHS  . pantoprazole  40 mg Oral Daily  . polyethylene glycol  17 g Oral Daily  . sodium chloride flush  3 mL Intravenous Q12H

## 2020-08-15 NOTE — Plan of Care (Signed)

## 2020-08-15 NOTE — TOC Progression Note (Signed)
Transition of Care [facility]) - Progression Note    Patient Details  Name: Dennis Macias MRN: [id number] Date of Birth: February 12, 1951  Transition of Care [facility]) CM/SW [place], [place] Phone Number: 08/15/2020, 1:39 PM  Clinical Narrative:     CSW started insurance authorization for patient. Reference number is# [id number].  Patient has SNF bed at [facility]. Insurance authorization pending.  CSW will continue to follow.   Expected Discharge Plan: [place] Barriers to Discharge: Continued Medical Work up  Expected Discharge Plan and Services Expected Discharge Plan: [place] arrangements for the past 2 months: [place] Expected Discharge Date: 08/03/20                         HH Arranged: PT,OT [place]: [place] Date [facility] Agency Contacted: 08/03/20   Representative spoke with at [place]: [place] ([place]) Interventions    Readmission Risk Interventions Readmission Risk Prevention Plan 01/19/2020 11/10/2019 08/10/2019  Transportation Screening Complete Complete Complete  PCP or Specialist Appt within 3-5 Days - - Complete  HRI or [place] - - Complete  Social Work Consult for [place] Planning/Counseling - - Complete  Palliative Care Screening - - Complete  Medication Review Press photographer) Complete Complete Complete  PCP or Specialist appointment within 3-5 days of discharge Complete - -  [place] or Home Care Consult Complete Complete -  SW Recovery Care/Counseling Consult Complete Complete -  Palliative Care Screening Complete Not Applicable -  [place] Complete Complete -  Some recent data might be hidden

## 2020-08-16 ENCOUNTER — Telehealth: Payer: Self-pay

## 2020-08-16 LAB — CBC
HCT: 25.9 % — ABNORMAL LOW (ref 39.0–52.0)
HCT: 26.9 % — ABNORMAL LOW (ref 39.0–52.0)
Hemoglobin: 8 g/dL — ABNORMAL LOW (ref 13.0–17.0)
Hemoglobin: 8.7 g/dL — ABNORMAL LOW (ref 13.0–17.0)
MCH: 29.2 pg (ref 26.0–34.0)
MCH: 30.2 pg (ref 26.0–34.0)
MCHC: 30.9 g/dL (ref 30.0–36.0)
MCHC: 32.3 g/dL (ref 30.0–36.0)
MCV: 94.5 fL (ref 80.0–100.0)
MCV: 93.4 fL (ref 80.0–100.0)
Platelets: 448 10*3/uL — ABNORMAL HIGH (ref 150–400)
Platelets: 447 10*3/uL — ABNORMAL HIGH (ref 150–400)
RBC: 2.74 MIL/uL — ABNORMAL LOW (ref 4.22–5.81)
RBC: 2.88 MIL/uL — ABNORMAL LOW (ref 4.22–5.81)
RDW: 14.6 % (ref 11.5–15.5)
RDW: 14.7 % (ref 11.5–15.5)
WBC: 15.7 10*3/uL — ABNORMAL HIGH (ref 4.0–10.5)
WBC: 14.4 10*3/uL — ABNORMAL HIGH (ref 4.0–10.5)
nRBC: 0.2 % (ref 0.0–0.2)
nRBC: 0.1 % (ref 0.0–0.2)

## 2020-08-16 LAB — BASIC METABOLIC PANEL
Anion gap: 19 — ABNORMAL HIGH (ref 5–15)
BUN: 50 mg/dL — ABNORMAL HIGH (ref 8–23)
CO2: 26 mmol/L (ref 22–32)
Calcium: 8.6 mg/dL — ABNORMAL LOW (ref 8.9–10.3)
Chloride: 91 mmol/L — ABNORMAL LOW (ref 98–111)
Creatinine, Ser: 7.49 mg/dL — ABNORMAL HIGH (ref 0.61–1.24)
GFR, Estimated: 7 mL/min — ABNORMAL LOW (ref >60)
Glucose, Bld: 175 mg/dL — ABNORMAL HIGH (ref 70–99)
Potassium: 4.3 mmol/L (ref 3.5–5.1)
Sodium: 136 mmol/L (ref 135–145)

## 2020-08-16 LAB — GLUCOSE, CAPILLARY
Glucose-Capillary: 161 mg/dL — ABNORMAL HIGH (ref 70–99)
Glucose-Capillary: 175 mg/dL — ABNORMAL HIGH (ref 70–99)
Glucose-Capillary: 149 mg/dL — ABNORMAL HIGH (ref 70–99)
Glucose-Capillary: 166 mg/dL — ABNORMAL HIGH (ref 70–99)

## 2020-08-16 MED ORDER — LIDOCAINE HCL (PF) 1 % IJ SOLN: 5.0000 mL | INTRAMUSCULAR | Status: DC | PRN

## 2020-08-16 MED ORDER — VANCOMYCIN HCL IN DEXTROSE 1-5 GM/200ML-% IV SOLN
INTRAVENOUS | Status: AC
  Filled 2020-08-16: qty 200

## 2020-08-16 MED ORDER — SODIUM CHLORIDE 0.9 % IV SOLN: 100.0000 mL | INTRAVENOUS | Status: DC | PRN

## 2020-08-16 MED ORDER — HEPARIN SODIUM (PORCINE) 1000 UNIT/ML DIALYSIS: 1000.0000 [IU] | INTRAMUSCULAR | Status: DC | PRN

## 2020-08-16 MED ORDER — PENTAFLUOROPROP-TETRAFLUOROETH EX AERO: 1.0000 "application " | INHALATION_SPRAY | CUTANEOUS | Status: DC | PRN

## 2020-08-16 MED ORDER — LIDOCAINE-PRILOCAINE 2.5-2.5 % EX CREA: 1.0000 "application " | TOPICAL_CREAM | CUTANEOUS | Status: DC | PRN

## 2020-08-16 MED ORDER — ALTEPLASE 2 MG IJ SOLR: 2.0000 mg | Freq: Once | INTRAMUSCULAR | Status: DC | PRN

## 2020-08-16 NOTE — Care Management Important Message (Signed)
Important Message  Patient Details  Name: Dennis Macias MRN: [id number] Date of Birth: 01/28/1951   Medicare Important Message Given:  Yes     [name] 08/16/2020, 11:19 AM

## 2020-08-16 NOTE — Discharge Summary (Addendum)
Name: Dennis Macias MRN: [id number] DOB: [birth year] 70 y.o. PCP: Dennis Hansen, MD  Date of Admission: 07/28/2020  4:27 PM Date of Discharge: 08/19/20 Attending Physician: .Dennis Contes, MD  Discharge Diagnoses:  1. 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 Hyperlipidemia 3. Acute on chronic anemia 4. Chronic combined systolic and diastolic heart failure ([place]) 5. ESRD (end stage renal disease) ([place]) 6. Acute cerebrovascular accident (CVA) due to ischemia ([place]) 7. PAD (peripheral artery disease) ([place]) 8. Orthostatic hypotension 9. S/P BKA (below knee amputation) ([place]) 10. Syncope 11. Stroke ([place]) 12. Dark stools 13. Nonerosive esophagitis 14. Type 2 Diabetes  Discharge Medications: Allergies as of 08/19/2020       Reactions   Cefepime Other (See Comments)   Pt had BAD encephalopathy from Cefepime        Medication List     TAKE these medications    Accu-Chek FastClix Lancets Misc Check blood sugar up to 7 times a week as instructed   Accu-Chek Guide test strip Generic drug: glucose blood Check blood sugar up to 7 times a week as instructed   Accu-Chek Guide w/Device Kit 1 each by Does not apply route daily. Check blood sugar as instructed up to 7 times a week   acetaminophen 500 MG tablet Commonly known as: TYLENOL Take 500 mg by mouth every 6 (six) hours as needed for moderate pain or headache.   aspirin EC 81 MG tablet Take 1 tablet (81 mg total) by mouth daily.   atorvastatin 80 MG tablet Commonly known as: LIPITOR Take 1 tablet (80 mg total) by mouth at bedtime. IM program   clopidogrel 75 MG tablet Commonly known as: Plavix Take 1 tablet (75 mg total) by mouth daily.   Darbepoetin Alfa 40 MCG/0.4ML Sosy injection Commonly known as: ARANESP Inject 0.4 mLs (40 mcg total) into the vein every Saturday with hemodialysis. Start taking on: August 20, 2020   dorzolamide-timolol 22.3-6.8 MG/ML ophthalmic solution Commonly known  as: COSOPT Place 1 drop into the right eye 2 (two) times daily.   finasteride 5 MG tablet Commonly known as: PROSCAR Take 1 tablet (5 mg total) by mouth daily.   lidocaine-prilocaine cream Commonly known as: EMLA Apply 1 application topically daily as needed (prior to fistula use).   mirtazapine 15 MG tablet Commonly known as: REMERON Take 1 tablet (15 mg total) by mouth at bedtime.   oxyCODONE 5 MG immediate release tablet Commonly known as: Roxicodone Take 1 tablet (5 mg total) by mouth every 8 (eight) hours as needed for up to 5 days for moderate pain.   pantoprazole 40 MG tablet Commonly known as: PROTONIX Take 1 tablet (40 mg total) by mouth daily.   polyethylene glycol 17 g packet Commonly known as: MIRALAX / GLYCOLAX Take 17 g by mouth daily as needed for moderate constipation.   sevelamer carbonate 800 MG tablet Commonly known as: RENVELA Take 800 mg by mouth See admin instructions. Take 800 mg by mouth three times a day with food on Sun/Mon/Wed/Fri and two times a day on Tues/Thurs/Sat   vitamin B-12 1000 MCG tablet Commonly known as: CYANOCOBALAMIN Take 1 tablet (1,000 mcg total) by mouth daily.               Discharge Care Instructions  (From admission, onward)           Start     Ordered   08/19/20 0000  Discharge wound care:       Comments: Keep wound  clean and dry   08/19/20 1128   08/19/20 0000  No dressing needed        08/19/20 1128             Disposition and follow-up:   Dennis Macias was discharged from [facility] in Stable condition.  At the hospital follow up visit please address:  Syncopal episode Post-HD hypotension Acute ischemic left thalamic infarct History of multiple prior CVAs (2017, 2021) HTN, HLD - Ensure follow-up with neurology on 10/10/2020 - Continue current HD schedule: TTS - Follow-up with nephrology   Acute on chronic anemia FOBT positive Nonerosive esophagitis - Continue Protonix  13m Daily indefinitely  - Follow-up with GI on as as-needed basis - monitor CBC  R 1st and 2nd digit ulcerations s/p R TMA (2/8) History of Peripheral vascular disease s/p L BKA - Ensure patient follows up with vascular surgery for suture removal on 09/05/2020  Type 2 Diabetes Mellitus - Surveillance of A1c, has been requiring insulin 7 units at bed time in hospital, may need to restarted medication.  2.  Labs / imaging needed at time of follow-up: CBC, renal function panel, phos  3.  Pending labs/ test needing follow-up:  - none  Follow-up Appointments: - in 3 weeks with vascular surgery for suture removal [facility] 09/05/20 with retina specialist - 10/10/20 with neurology   Hospital Course by problem list:  LVernis Macias a 70y.o. man with history of ESRD on HD TTS, type 2 diabetes mellitus, history of CVA, hypertension, heart failure, and peripheral vascular disease on dual antiplatelet therapy who presented after syncopal episode at dialysis and admitted for further work-up and evaluation.   Syncopal episode Post-HD hypotension Acute ischemic left thalamic infarct History of multiple prior CVAs (2017, 2021) HTN, HLD Patient presented to the ED after a syncopal episode at dialysis. Suspect syncopal episode due to decreased cerebral perfusion post-HD given his chronic issues with hypotension following HD. Acute on chronic anemia may be contributing as well (see below). Extensive work-up in the ED revealed a new acute ischemic left thalamic infarct. He was already on DAPT due to his peripheral vascular disease, and this was continued. Had an episode of bradycardia/syncope on HD on 1/24. Did not have further episodes during admission.  Sepsis secondary to MSSA bacteremia  R 1st and 2nd digit ulcerations s/p R TMA on 2-3 S/p angioplasty of the R posterior tibial artery on 1/28 History of Peripheral vascular disease s/p L BKA On dual antiplatelet platelet therapy due to PVD. RLE  duplex and ABI obtained however unreliable due to noncompressible RLE arteries. Patient was to discharge on 1/26, however spiked a fever prior to discharge. Blood cultures were collected. Vascular consulted and on 1/28 performed abdominal aortogram and angioplasty of the right posterior tibial artery. Patient with persistent fever, leukocytosis, vitals otherwise stable without tachycardia or hypotension, 3 out of 4 blood cultures collected 1/26 at 6 pm grew staph aureus, pansensitive. ID was consulted. Suspected source of bacteremia is patient's ischemic toes. Treated with IV cefazolin and completed 14 days last day on 2/10. R TMA 2/8 healing well now weight bearing on right heel with draco boot poor perfusion during TMA vascular concerned he is high risk for future BKA.  Acute on chronic anemia FOBT positive Nonerosive esophagitis Patient with hemoglobin of 7.4 on admission. Review of HD record showed hemoglobin of 11.3 on 07/21/20. Patient has not noted any dark or bloody stools at home, but FOBT+  in the ED. Suspicious for acute blood loss, and GI consulted in the ED and started patient on Protonix 40 mg twice daily. Transfused with  4 units of rprBC throughout admission. Hgb of 10 on day of sicharge. EGD performed on 1/25 and demonstrated nonerosive esophagitis for which GI recommended pantoprozole 40 mg daily indefinitely. No outpatient GI follow-up required.   ESRD on HD TTS Patient taken for HD on 1/24 and 1/25. Had an episode of bradycardia/syncope on HD on 1/24. Subsequent inpatient HD sessions without repeat episode. Did not get dialysis yesterday due to high census  will have of schedule HD today.  Will continue TTS schedule after discharge. Next HD 08/18/2020  Type 2 Diabetes Mellitus Patient has been previously well-controlled off medications. Last A1c 6.4% in 02/2020, likely falsely low in s/o ESRD. CBGs were stable on Lantus 7 units during admission. Follow up in clinic to discuss starting  outpatient medications.  Depression Daughter concern about his mood as he is not taking to family or eating much. He reports decreased appetite. Discussed starting new medications for appetite and mood. Mirtazapine 3m nightly  SUBJECTIVE:  Dennis LHouston Zapienevaluated at bedside this morning. He is resting comfortably in bed. He does not endorse any pain at this time. He notes eating some, but not a lot. Has decreased appetite. Advised for diet and nutrition. Discussed starting on medication to help with appetite.  Notes that he talked to his daughter some. His wife came to visit. Discussed discharge to SNF today. He expresses understanding.   Discharge Exam:   Blood pressure 103/62, pulse 81, temperature 98.2 F (36.8 C), temperature source Oral, resp. rate 10, height 6' (1.829 m), weight 63 kg, SpO2 99 %.  Constitutional: chronically ill-appearing man lying bed, in no acute distress Cardiovascular: regular rate and rhythm, no m/r/g Pulmonary/Chest: normal work of breathing on room air, lungs clear to auscultation bilaterally MSK: slight build, L BKA  Skin: RLE cool and dry. Right TMS healing well no signs of infection  Pertinent Labs, Studies, and Procedures:   07/29/20 Dennis BRAIN WO CONTRAST  CLINICAL DATA:  Initial evaluation for recurrent syncope. EXAM: MRI HEAD WITHOUT CONTRAST TECHNIQUE: Multiplanar, multiecho pulse sequences of the brain and surrounding structures were obtained without intravenous contrast. COMPARISON:  Prior MRI from 02/17/2020. FINDINGS: Brain: Diffuse prominence of the CSF containing spaces compatible with moderately advanced age-related cerebral atrophy. Extensive patchy and confluent T2/FLAIR hyperintensity within the periventricular and deep white matter both cerebral hemispheres most consistent with chronic small vessel ischemic disease, fairly advanced in nature. Few scatter remote lacunar infarcts noted about the left basal ganglia and hemispheric cerebral white  matter. 4 mm focus of restricted diffusion seen involving the ventral medial left thalamus, consistent with an acute ischemic infarct (series 5, image 72). No associated hemorrhage or mass effect. No other evidence for acute or subacute ischemia. Gray-white matter differentiation otherwise maintained. No encephalomalacia to suggest chronic cortical infarction. No acute intracranial hemorrhage. Multiple scattered chronic micro hemorrhages seen throughout both cerebral hemispheres, likely hypertensive in nature. No mass lesion, midline shift or mass effect. No hydrocephalus or extra-axial fluid collection. Pituitary gland suprasellar region normal. Midline structures intact. Vascular: Major intracranial vascular flow voids are maintained. Skull and upper cervical spine: Craniocervical junction within normal limits. Degenerative spondylosis noted at C3-4 with resultant mild spinal stenosis. Bone marrow signal intensity within normal limits. No scalp soft tissue abnormality. Sinuses/Orbits: Patient status post bilateral ocular lens replacement. Globes and orbital soft tissues demonstrate no acute  finding. Paranasal sinuses are largely clear. No significant mastoid effusion. Inner ear structures grossly normal. Other: None. IMPRESSION: 1. 4 mm acute ischemic nonhemorrhagic left thalamic infarct. 2. Moderately age-related cerebral atrophy with advanced chronic microvascular ischemic disease. 3. Multiple chronic micro hemorrhages scattered throughout both cerebral hemispheres, favored to be related to chronic poorly controlled hypertension. Electronically Signed   By: Jeannine Boga M.D.   On: 07/29/2020 03:26   07/28/20 DG Chest Port 1 View  CLINICAL DATA:  Syncopal episode following dialysis therapy EXAM: PORTABLE CHEST 1 VIEW COMPARISON:  02/17/2020 FINDINGS: Cardiac shadows within normal limits. Aortic calcifications are seen. Previously seen dialysis catheter has been removed in the interval. The lungs are  clear. No bony abnormality is noted. IMPRESSION: No active disease. Electronically Signed   By: Inez Catalina M.D.   On: 07/28/2020 17:09   07/28/20 DG Foot Complete Right  CLINICAL DATA:  NECROSIS OF 1ST AND 2ND TOE PER ORDER / PATIENT STATES HE IS DIABETICthe EXAM: RIGHT FOOT COMPLETE - 3+ VIEW COMPARISON:  None. FINDINGS: No osseous erosion associated with the first or second toe. No soft tissue gas or ulceration identified. Extensive arterial vascular calcifications noted. IMPRESSION: No evidence of osteomyelitis. Electronically Signed   By: Suzy Bouchard M.D.   On: 07/28/2020 18:31   07/29/20 VAS [place] ABI WITH/WO TBI  LOWER EXTREMITY DOPPLER STUDY Indications: Ulceration, and peripheral artery disease. High Risk         Hypertension, hyperlipidemia, Diabetes, past history of Factors:          smoking.  Vascular Interventions: AVF Right arm. Limitations: Today's exam was limited due to AVF RT Lt AKA. Comparison Study: Prev Performing Technologist: Vonzell Schlatter RVT  Examination Guidelines: A complete evaluation includes at minimum, Doppler waveform signals and systolic blood pressure reading at the level of bilateral brachial, anterior tibial, and posterior tibial arteries, when vessel segments are accessible. Bilateral testing is considered an integral part of a complete examination. Photoelectric Plethysmograph (PPG) waveforms and toe systolic pressure readings are included as required and additional duplex testing as needed. Limited examinations for reoccurring indications may be performed as noted.  ABI Findings: +--------+------------------+-----+----------+--------+ Right   Rt Pressure (mmHg)IndexWaveform  Comment  +--------+------------------+-----+----------+--------+ Brachial                                 AVF      +--------+------------------+-----+----------+--------+ PTA     216               1.52 biphasic           +--------+------------------+-----+----------+--------+ DP       226               1.59 monophasic         +--------+------------------+-----+----------+--------+ +--------+------------------+-----+---------+-------+ Left    Lt Pressure (mmHg)IndexWaveform Comment +--------+------------------+-----+---------+-------+ [id number]                    triphasic        +--------+------------------+-----+---------+-------+ +-------+-----------+-----------+------------+------------+ ABI/TBIToday's ABIToday's TBIPrevious ABIPrevious TBI +-------+-----------+-----------+------------+------------+ Right  1.5                   1.2                      +-------+-----------+-----------+------------+------------+ Arterial wall calcification precludes accurate ankle pressures and ABIs.  Summary: Right: Resting right ankle-brachial index indicates noncompressible right lower extremity arteries. ABIs are  unreliable.  *See table(s) above for measurements and observations.  Electronically signed by Deitra Mayo MD on 07/29/2020 at 3:10:15 PM.   Final      CBC Latest Ref Rng & Units 08/03/2020 08/03/2020 08/02/2020  WBC 4.0 - 10.5 K/uL 8.8 8.2 10.0  Hemoglobin 13.0 - 17.0 g/dL 8.2(L) 8.2(L) 9.5(L)  Hematocrit 39.0 - 52.0 % 24.8(L) 24.9(L) 28.2(L)  Platelets 150 - 400 K/uL 287 253 271   BMP Latest Ref Rng & Units 08/03/2020 08/02/2020 08/01/2020  Glucose 70 - 99 mg/dL 236(H) 129(H) 138(H)  BUN 8 - 23 mg/dL 23 58(H) 68(H)  Creatinine 0.61 - 1.24 mg/dL 2.78(H) 4.71(H) 4.19(H)  BUN/Creat Ratio 10 - 24 - - -  Sodium 135 - 145 mmol/L 136 135 135  Potassium 3.5 - 5.1 mmol/L 4.2 3.6 3.5  Chloride 98 - 111 mmol/L 99 94(L) 98  CO2 22 - 32 mmol/L _0 Calcium 8.9 - 10.3 mg/dL 8.0(L) 8.8(L) 8.7(L)     Discharge Instructions: Discharge Instructions     (HEART FAILURE PATIENTS) Call MD:  Anytime you have any of the following symptoms: 1) 3 pound weight gain in 24 hours or 5 pounds in 1 week 2) shortness of breath, with or without a dry hacking cough 3)  swelling in the hands, feet or stomach 4) if you have to sleep on extra pillows at night in order to breathe.   Complete by: As directed    Call MD for:  difficulty breathing, headache or visual disturbances   Complete by: As directed    Call MD for:  extreme fatigue   Complete by: As directed    Call MD for:  hives   Complete by: As directed    Call MD for:  persistant dizziness or light-headedness   Complete by: As directed    Call MD for:  persistant nausea and vomiting   Complete by: As directed    Call MD for:  severe uncontrolled pain   Complete by: As directed    Call MD for:  temperature >100.4   Complete by: As directed    Diet - low sodium heart healthy   Complete by: As directed    Increase activity slowly   Complete by: As directed       Dennis. [place],  It was a pleasure taking care of you in the hospital.  You were admitted after passing out during a dialysis session.  - You were found to have had a new stroke. It will be important to continue your aspirin and Plavix. - You were evaluated by nephrology (kidney specialists) who carried out your dialysis. Your last session was on 2/11 You will continue your dialysis schedule after discharge. - Your blood counts were low, so you received blood transfusions while in the hospital. Due to concern for possible bleeding from your GI tract, you were evaluated by the GI doctors who used a camera to look into your upper GI tract for possible bleeding. They found inflammation and have recommended that you take an antacid (Protonix) daily. - You were found to have bacteria in your blood which likely came from your infected R toes. You were treated with antibiotics and taken for a procedure to improve blood flow in your right leg as well as an amputation. Please follow up with vascular surgery in 3 weeks for suture removal. You can take oxycodone 5 mg every 8 hours as needed for pain.  We would like you to follow-up in clinic  next week.  Someone will call you to schedule the appointment.  Take care!  Signed: Iona Beard, MD PGY-1 Internal Medicine Teaching Service Pager: [phone] 08/16/2020

## 2020-08-16 NOTE — Telephone Encounter (Signed)
08/03/2020 d/c was canceled  pt is pending d/c from hospital today 08/16/20   No further actions needed on this encounter.Despina Hidden Cassady2/8/20222:04 PM

## 2020-08-16 NOTE — Progress Notes (Addendum)
  Progress Note    08/16/2020 7:58 AM 5 Days Post-Op  Subjective:  NO complaints   Vitals:   08/15/20 1500 08/16/20 0400  BP: 94/64 103/62  Pulse: 81   Resp: 15 10  Temp: 98.3 F (36.8 C) 98.2 F (36.8 C)  SpO2: 99%    Physical Exam: Lungs:  Non labored Incisions:  R TMA stable, skin edges viable Extremities:  R foot warm Neurologic: A&O  CBC    Component Value Date/Time   WBC 15.7 (H) 08/16/2020 0633   RBC 2.74 (L) 08/16/2020 0633   HGB 8.0 (L) 08/16/2020 0633   HGB 11.1 (L) 05/02/2020 1454   HCT 25.9 (L) 08/16/2020 0633   HCT 35.5 (L) 05/02/2020 1454   PLT 448 (H) 08/16/2020 0633   PLT 221 05/02/2020 1454   MCV 94.5 08/16/2020 0633   MCV 86 05/02/2020 1454   MCH 29.2 08/16/2020 0633   MCHC 30.9 08/16/2020 0633   RDW 14.6 08/16/2020 0633   RDW 15.7 (H) 05/02/2020 1454   LYMPHSABS 1.8 08/11/2020 2233   LYMPHSABS 1.3 05/02/2020 1454   MONOABS 0.9 08/11/2020 2233   EOSABS 0.0 08/11/2020 2233   EOSABS 0.0 05/02/2020 1454   BASOSABS 0.0 08/11/2020 2233   BASOSABS 0.0 05/02/2020 1454    BMET    Component Value Date/Time   NA 136 08/16/2020 0633   NA 137 12/10/2019 1439   K 4.3 08/16/2020 0633   CL 91 (L) 08/16/2020 0633   CO2 26 08/16/2020 0633   GLUCOSE 175 (H) 08/16/2020 0633   BUN 50 (H) 08/16/2020 0633   BUN 80 (HH) 12/10/2019 1439   CREATININE 7.49 (H) 08/16/2020 0633   CALCIUM 8.6 (L) 08/16/2020 0633   GFRNONAA 7 (L) 08/16/2020 0633   GFRAA 19 (L) 04/05/2020 0705    INR    Component Value Date/Time   INR 1.5 (H) 08/11/2020 0202     Intake/Output Summary (Last 24 hours) at 08/16/2020 0758 Last data filed at 08/15/2020 7035 Gross per 24 hour  Intake 3 ml  Output -  Net 3 ml     Assessment/Plan:  70 y.o. male is s/p R TMA 5 Days Post-Op   TMA stable Nothing further to add from vascular standpoint Ok for discharge   Dagoberto Ligas, PA-C [facility] [phone] 08/16/2020 7:58 AM  TMA site i appears to be  healing.  There is no obvious skin breakdown or drainage.  Patient will need follow-up in the office for suture removal in approximately 3 weeks.  He can be discharged home when he is medically ready.  [name]

## 2020-08-16 NOTE — Progress Notes (Signed)
PT Cancellation Note  Patient Details Name: Dennis Macias MRN: [id number] DOB: January 19, 1951   Cancelled Treatment:    Reason Eval/Treat Not Completed: Patient declined, no reason specified "I don't feel like it." Will continue attempts   [place] 08/16/2020, 11:38 AM [place] Pager [phone] Office [phone]

## 2020-08-16 NOTE — TOC Progression Note (Signed)
Transition of Care [facility]) - Progression Note    Patient Details  Name: Dennis Macias MRN: [id number] Date of Birth: Jun 18, 1951  Transition of Care [facility]) CM/SW [place], [place] Phone Number: 08/16/2020, 12:11 PM  Clinical Narrative:     CSW received insurance authorization approval for patient. Reference number is #[id number]. Approved from 2/7-2/9. Next review date is 2/9.  Patient has SNF bed at [facility]. Insurance authorization has been approved.  CSW will continue to follow.    Expected Discharge Plan: [place] Barriers to Discharge: Continued Medical Work up  Expected Discharge Plan and Services Expected Discharge Plan: [place] arrangements for the past 2 months: [place] Expected Discharge Date: 08/03/20                         HH Arranged: PT,OT [place]: [place] Date [facility] Agency Contacted: 08/03/20   Representative spoke with at [place]: [place] ([place]) Interventions    Readmission Risk Interventions Readmission Risk Prevention Plan 01/19/2020 11/10/2019 08/10/2019  Transportation Screening Complete Complete Complete  PCP or Specialist Appt within 3-5 Days - - Complete  HRI or [place] - - Complete  Social Work Consult for [place] Planning/Counseling - - Complete  Palliative Care Screening - - Complete  Medication Review Press photographer) Complete Complete Complete  PCP or Specialist appointment within 3-5 days of discharge Complete - -  [place] or Home Care Consult Complete Complete -  SW Recovery Care/Counseling Consult Complete Complete -  Palliative Care Screening Complete Not Applicable -  [place] Complete Complete -  Some recent data might be hidden

## 2020-08-16 NOTE — Progress Notes (Signed)
Subjective: No complaints for dialysis today  Objective Vital signs in last 24 hours: Vitals:   08/14/20 1400 08/15/20 0419 08/15/20 1500 08/16/20 0400  BP: 116/72 108/60 94/64 103/62  Pulse: 79 72 81   Resp:  16 15 10   Temp: 98.2 F (36.8 C) 97.6 F (36.4 C) 98.3 F (36.8 C) 98.2 F (36.8 C)  TempSrc: Oral Oral Oral Oral  SpO2: 98% 99% 99%   Weight:  63 kg    Height:       Weight change:   Physical Exam General: Alert, chronically ill appearing male in NAD Heart:RRR, no mrg Lungs:CTAB, unlabored breathing on RA Abdomen: Bowel sounds normoactive soft, NTND Extremities:no LE edema, L BKA, R TMA with sutures intact Dialysis Access:RU AVF +b/t  Dialysis Orders: TTS -[facility] 4hrs, BFR400, [id number], EDW 69kg,2K/2.25Ca Access:RU AVF Heparin4000 unit bolus Hectorol73mcg IV qHD  Problem/Plan: 1. Sepsis 2/2 MSSA bacteremia. ID consulted,TEE negative for endocarditis. Suspected source gangrenous toess/p TMA 08/11/20.BC from 1/27 negative. Cefazolin until 08/18/20.  2. Syncopal episode -likely multifactorial - acute CVA, hypotension, and acute on chronic anemia. Not volume overloaded.No further syncope.Minimal UF with HD. 3. Acute CVA- seen on MRI. Thought to be related to cerebral hypoperfusion with hypotension post HD, as this was the cause of a past stroke. Per neuro/Admit 4. R 1st & 2nd digit ulcerations/Hx PVD: S/p RLE balloon angioplasty to PTA 05/29/20.VVS consulted, wentfor arteriogram01/28/22, followed by TMA on 08/11/20. High risk for BKA per Dr. Hartley Barefoot to operative findings. 5. ESRD:Continue TTS schedule. HD treatments shortened due to high patient census/staffing shortage. Not uremic or volume overloaded onexam.Next HD today 6. Hypertension/volume: BPmostlycontrolled.Belowdry wt with no volume overload on exam. Losing some weight with sepsis, NPO status/nausea. Lower dry on d/c.UF as tolerated, keep SBP>100. 7. Acute on chronic  Anemiaof CKD:+ FOBT, Hgb drop 11.3 (on 1/13 as OP)  8.0 this a.m.,  s/p 1 unit pRBC1/20and another unit on 1/24.GI consulted,underwent EGD1/25which showed esophagitis.Continue ESA.  Aranesp 40 given on 02/5 8. Secondary Hyperparathyroidism:Corrected calciumcontrolled, has been running high,Phos 4.2 andbinders discontinued,Hectorolon hold, follow Ca. 9. DMT2: per admitting team   Ernest Haber, PA-C [facility] Beeper [phone] 08/16/2020,10:58 AM  LOS: 18 days   Labs: Basic Metabolic Panel: Recent Labs  Lab 08/10/20 0229 08/11/20 0202 08/11/20 0853 08/12/20 0036 08/14/20 0310 08/15/20 0140 08/16/20 0633  NA 139   < > 138   < > 135 136 136  K 4.1   < > 4.7   < > 3.9 4.2 4.3  CL 97*   < > 97*   < > 93* 93* 91*  CO2 27   < > 24   < > 24 24 26   GLUCOSE 120*   < > 137*   < > 126* 127* 175*  BUN 18   < > 35*   < > 21 32* 50*  CREATININE 3.48*   < > 5.77*   < > 4.31* 6.15* 7.49*  CALCIUM 8.2*   < > 8.3*   < > 7.9* 8.4* 8.6*  PHOS 2.4*  --  4.2  --   --   --   --    < > = values in this interval not displayed.   Liver Function Tests: Recent Labs  Lab 08/09/20 1121 08/10/20 0229 08/11/20 0202 08/11/20 0853  AST 17  --  17  --   ALT <5  --  <5  --   ALKPHOS 72  --  75  --   BILITOT  0.7  --  1.1  --   PROT 6.0*  --  6.5  --   ALBUMIN 2.0* 1.9* 1.9* 1.9*   No results for input(s): LIPASE, AMYLASE in the last 168 hours. No results for input(s): AMMONIA in the last 168 hours. CBC: Recent Labs  Lab 08/11/20 2233 08/12/20 0036 08/13/20 0208 08/14/20 0310 08/15/20 0140 08/16/20 0633  WBC 16.1* 15.7* 14.5* 18.4* 15.3* 15.7*  NEUTROABS 13.1*  --   --   --   --   --   HGB 9.3* 9.3* 9.0* 9.4* 10.1* 8.0*  HCT 29.6* 28.0* 27.9* 30.6* 31.5* 25.9*  MCV 92.8 92.1 93.0 94.7 93.2 94.5  PLT 480* 447* 431* 418* 489* 448*   Cardiac Enzymes: No results for input(s): CKTOTAL, CKMB, CKMBINDEX, TROPONINI in the last 168 hours. CBG: Recent Labs  Lab  08/15/20 1042 08/15/20 1805 08/15/20 2126 08/16/20 0804 08/16/20 0816  GLUCAP 87 208* 206* 175* 161*    Studies/Results: No results found. Medications: . sodium chloride 250 mL (08/06/20 0342)  .  ceFAZolin (ANCEF) IV 1 g (08/15/20 1852)   . aspirin EC  81 mg Oral Daily  . atorvastatin  80 mg Oral Daily  . Chlorhexidine Gluconate Cloth  6 each Topical Q0600  . clopidogrel  75 mg Oral Daily  . darbepoetin (ARANESP) injection - DIALYSIS  40 mcg Intravenous Q Sat-HD  . docusate sodium  100 mg Oral Daily  . dorzolamide-timolol  1 drop Right Eye BID  . finasteride  5 mg Oral Daily  . heparin injection (subcutaneous)  5,000 Units Subcutaneous Q8H  . insulin aspart  0-6 Units Subcutaneous TID WC  . insulin glargine  7 Units Subcutaneous QHS  . pantoprazole  40 mg Oral Daily  . polyethylene glycol  17 g Oral Daily  . sodium chloride flush  3 mL Intravenous Q12H

## 2020-08-16 NOTE — Progress Notes (Signed)
OT Cancellation Note  Patient Details Name: Dennis Macias MRN: [id number] DOB: 1951-04-26   Cancelled Treatment:    Reason Eval/Treat Not Completed: Patient at procedure or test/ unavailable Pt currently OTF for HD, OT will continue with attempts schedule permitting   Hemi Chacko OTR/L acute rehab services Office: [phone] 08/16/2020, 1:01 PM

## 2020-08-16 NOTE — Telephone Encounter (Signed)
TOC:  Per Dr Lisabeth Devoid 08/23/2020 215pm/nw

## 2020-08-16 NOTE — Progress Notes (Signed)
HD#18 Subjective:  Overnight: None  Patient states that he is doing fine. He does not have an appetite for breakfast. Pending SNF placement.  Objective:   Vital signs in last 24 hours: Vitals:   08/16/20 0400 08/16/20 1212 08/16/20 1217 08/16/20 1227  BP: 103/62  114/65 114/67  Pulse:   71   Resp: 10     Temp: 98.2 F (36.8 C)  97.9 F (36.6 C)   TempSrc: Oral  Oral   SpO2:   96%   Weight:  61.8 kg    Height:       Physical Exam Constitutional:manlying bed, in no acute distress, appears comfortable Cardiovascular: RRR, no murmurs Pulmonary/Chest: CTA, no respiratory distress. MSK: L BKA, RLE surgical wound healing well, no signs of infection Skin:warm, dry  Pertinent Labs: CBC Latest Ref Rng & Units 08/16/2020 08/16/2020 08/15/2020  WBC 4.0 - 10.5 K/uL 14.4(H) 15.7(H) 15.3(H)  Hemoglobin 13.0 - 17.0 g/dL 8.7(L) 8.0(L) 10.1(L)  Hematocrit 39.0 - 52.0 % 26.9(L) 25.9(L) 31.5(L)  Platelets 150 - 400 K/uL 447(H) 448(H) 489(H)    CMP Latest Ref Rng & Units 08/16/2020 08/15/2020 08/14/2020  Glucose 70 - 99 mg/dL 175(H) 127(H) 126(H)  BUN 8 - 23 mg/dL 50(H) 32(H) 21  Creatinine 0.61 - 1.24 mg/dL 7.49(H) 6.15(H) 4.31(H)  Sodium 135 - 145 mmol/L 136 136 135  Potassium 3.5 - 5.1 mmol/L 4.3 4.2 3.9  Chloride 98 - 111 mmol/L 91(L) 93(L) 93(L)  CO2 22 - 32 mmol/L 26 24 24   Calcium 8.9 - 10.3 mg/dL 8.6(L) 8.4(L) 7.9(L)  Total Protein 6.5 - 8.1 g/dL - - -  Total Bilirubin 0.3 - 1.2 mg/dL - - -  Alkaline Phos 38 - 126 U/L - - -  AST 15 - 41 U/L - - -  ALT 0 - 44 U/L - - -    Imaging: ECHO TEE  Result Date: 08/08/2020   IMPRESSIONS  1. Left ventricular ejection fraction, by estimation, is 60 to 65%. The left ventricle has normal function. The left ventricle has no regional wall motion abnormalities.   2. Right ventricular systolic function is normal. The right ventricular size is normal.   3. Left atrial size was mildly dilated. No left atrial/left atrial appendage thrombus was  detected.   4. The mitral valve is normal in structure. Trivial mitral valve regurgitation. No evidence of mitral stenosis.   5. The aortic valve is normal in structure. Aortic valve regurgitation is not visualized. No aortic stenosis is present.   6. Aortic dilatation noted. There is borderline dilatation of the aortic root, measuring 38 mm. There is Moderate (Grade III) plaque involving the transverse and descending aorta.   7. Inadequate agitated saline injection to assess for interatrial shunt. Conclusion(s)/Recommendation(s): No evidence of vegetation/infective endocarditis on this transesophageal echocardiogram.      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 man with history of ESRD on HD TTS, type 2 diabetes mellitus, history of CVA, hypertension, heart failure, and peripheral vascular disease on dual antiplatelet therapy who presented after syncopal episode at dialysis and admitted for MSSA bacteremia in the setting of R1st and R2nd digit ulcerations.   This is hospital day 18.  Sepsis secondary to MSSA bacteremia R 1st and 2nd digit ulcerations History of Peripheral vascular disease s/p L BKA S/p angioplasty of the R posterior tibial artery POD 3 (1/28) Patient reports pain is well controlled. S/p TMA post op day 4. Healing well. No signs of infection. Cleared for discharge  by vascular.  - Dilaudid 0.5 mg IV q4  - IV cefazolin day 12 /14  (day 1: 1/27, last day 2/10), - On dual antiplatelet platelet therapy due to PVD.  - R heel weightbearing only with Draco shoe - PT/OT: SNF, pending placement  Atypical CP Remains chest pain free. Prior troponins flat without changes of EKG.. No new chest pain today. Tele with alrams for STE in III and avF similar to prior episodes. Repeat EKG without acute changes. - Monitor on tele  Syncopal episode Post-HD hypotension ESRD on HD (TTS) Acute ischemic left thalamic infarct History of multiple prior CVAs (2017, 2021) HTN, HLD No further syncope. Getting shorten sessions of HD. Continue with HD on TTS schedule - Plan for HD today - Continue ASA and Plavix - Continue atorvastatin 80 mg  - Continued HD while hospitalized. Appreciate nephrology's assistance.   Acute on chronic anemia FOBT positive Nonerosive esophagitis Hgb of 8 this morning compared to 10.1 yesterday. On repeat 8.7. No signs of active bleeding.  - Protonix 40 mg daily  -Continue ESA - monitor CBC  Type 2 diabetes mellitus - Lantus 7 units - SSI sensitive - Carb/renal diet   Diet: renal/carb IVF: None VTE: Heparin Code: Full PT/OT recs: SNF for Subacute PT   Please contact the on call pager after 5 pm and on weekends at [phone].  Iona Beard, MD PGY-1 Internal Medicine Teaching Service Pager: [phone] 08/16/2020

## 2020-08-16 NOTE — TOC Progression Note (Signed)
Transition of Care [facility]) - Progression Note    Patient Details  Name: Dennis Macias MRN: [id number] Date of Birth: 10-Dec-1950  Transition of Care [facility]) CM/SW [place], [place] Phone Number: 08/16/2020, 1:02 PM  Clinical Narrative:     CSW spoke with [facility] and they said they can accept patient for SNF placement on Thursday 2/10. CSW called patients insurance and insurance should still be good for Thursday. SNF will just need to call to provide admission date to patients insurance.  CSW will continue to follow.    Expected Discharge Plan: [place] Barriers to Discharge: Continued Medical Work up  Expected Discharge Plan and Services Expected Discharge Plan: [place] arrangements for the past 2 months: [place] Expected Discharge Date: 08/03/20                         HH Arranged: PT,OT [place]: [place] Date [facility] Agency Contacted: 08/03/20   Representative spoke with at [place]: [place] ([place]) Interventions    Readmission Risk Interventions Readmission Risk Prevention Plan 01/19/2020 11/10/2019 08/10/2019  Transportation Screening Complete Complete Complete  PCP or Specialist Appt within 3-5 Days - - Complete  HRI or [place] - - Complete  Social Work Consult for [place] Planning/Counseling - - Complete  Palliative Care Screening - - Complete  Medication Review Press photographer) Complete Complete Complete  PCP or Specialist appointment within 3-5 days of discharge Complete - -  [place] or Home Care Consult Complete Complete -  SW Recovery Care/Counseling Consult Complete Complete -  Palliative Care Screening Complete Not Applicable -  [place] Complete Complete -  Some recent data might be hidden

## 2020-08-17 LAB — BASIC METABOLIC PANEL
Anion gap: 18 — ABNORMAL HIGH (ref 5–15)
BUN: 29 mg/dL — ABNORMAL HIGH (ref 8–23)
CO2: 24 mmol/L (ref 22–32)
Calcium: 8.4 mg/dL — ABNORMAL LOW (ref 8.9–10.3)
Chloride: 96 mmol/L — ABNORMAL LOW (ref 98–111)
Creatinine, Ser: 5.18 mg/dL — ABNORMAL HIGH (ref 0.61–1.24)
GFR, Estimated: 11 mL/min — ABNORMAL LOW (ref >60)
Glucose, Bld: 151 mg/dL — ABNORMAL HIGH (ref 70–99)
Potassium: 3.9 mmol/L (ref 3.5–5.1)
Sodium: 138 mmol/L (ref 135–145)

## 2020-08-17 LAB — CBC
HCT: 28.7 % — ABNORMAL LOW (ref 39.0–52.0)
Hemoglobin: 9.2 g/dL — ABNORMAL LOW (ref 13.0–17.0)
MCH: 30.4 pg (ref 26.0–34.0)
MCHC: 32.1 g/dL (ref 30.0–36.0)
MCV: 94.7 fL (ref 80.0–100.0)
Platelets: 413 10*3/uL — ABNORMAL HIGH (ref 150–400)
RBC: 3.03 MIL/uL — ABNORMAL LOW (ref 4.22–5.81)
RDW: 14.9 % (ref 11.5–15.5)
WBC: 15.4 10*3/uL — ABNORMAL HIGH (ref 4.0–10.5)
nRBC: 0 % (ref 0.0–0.2)

## 2020-08-17 LAB — GLUCOSE, CAPILLARY
Glucose-Capillary: 134 mg/dL — ABNORMAL HIGH (ref 70–99)
Glucose-Capillary: 175 mg/dL — ABNORMAL HIGH (ref 70–99)
Glucose-Capillary: 218 mg/dL — ABNORMAL HIGH (ref 70–99)
Glucose-Capillary: 185 mg/dL — ABNORMAL HIGH (ref 70–99)

## 2020-08-17 LAB — SARS CORONAVIRUS 2 (TAT 6-24 HRS): SARS Coronavirus 2: NEGATIVE

## 2020-08-17 NOTE — Progress Notes (Signed)
Physical Therapy Treatment Patient Details Name: Dennis Macias MRN: [id number] DOB: 1950/08/02 Today's Date: 08/17/2020    History of Present Illness Pt is a 70 y/o male with PMH of L BKA, s/p balloon angioplasty of occluded vessels R foot 11/21, L ICA CVA (2017), NIDDM type II with retinopathy, CKD on hemodialysis, nephrotic syndrome, combined systolic and diastolic CHF, CAD w/ NSTEMI, HTN, HLD, tubular adenmoa of colon, tachycardia, syncope, PVD, and protein malnutrition who presents after a syncopal episode at hemodialysis. His R foot wounds continue to worsen with noted dry gangrene on 1st and 2nd toes. now s/p angioplasty of the right posterior tibial artery on 1/27; MRI revealed acute ischemic nonhemorrhagic L thalamic infarct and multiple chronic micro hemorrhages scattered throughout both cerebral hemispheres; R TMA 08/11/20.    PT Comments    On arrival, patient unaware of bowel movement in bed. Performed rolling with minA for pericare. Patient maxA+2 for supine>sit with difficulty sequencing requiring cues for hand placement. Patient required maxA to maintain sitting balance EOB with inability to correct LOB without external assistance. Patient presents with impaired sitting balance, decreased activity tolerance, generalized weakness, and impaired functional mobility. Continue to recommend SNF for ongoing Physical Therapy.       Follow Up Recommendations  SNF;Supervision/Assistance - 24 hour     Equipment Recommendations  None recommended by PT    Recommendations for Other Services       Precautions / Restrictions Precautions Precautions: Fall Precaution Comments: hx of L BKA; HOH Required Braces or Orthoses: Other Brace Other Brace: darco shoe on Rt Restrictions Weight Bearing Restrictions: Yes RLE Weight Bearing: Weight bearing as tolerated (thru heel only) LLE Weight Bearing: Non weight bearing    Mobility  Bed Mobility Overal bed mobility: Needs Assistance Bed Mobility:  Supine to Sit;Sit to Supine;Rolling Rolling: Min assist   Supine to sit: Max assist;+2 for safety/equipment;+2 for physical assistance Sit to supine: Min assist   General bed mobility comments: maxA+2 to perform supine>sit with difficulty sequencing, cues for hand placement to assist with sitting up. MinA for rolling to perform pericare  Transfers                 General transfer comment: Deferred transfers this session due to patient requiring maxA to maintain sitting balance on EOB  Ambulation/Gait                 Stairs             Wheelchair Mobility    Modified Rankin (Stroke Patients Only)       Balance Overall balance assessment: Needs assistance Sitting-balance support: Bilateral upper extremity supported;Feet supported Sitting balance-Leahy Scale: Poor Sitting balance - Comments: maxA to maintain sitting balance. Poor trunk control with LOB anterior, posterior and right. Overshooting when asked to correct positioning                                    Cognition Arousal/Alertness: Awake/alert Behavior During Therapy: Flat affect Overall Cognitive Status: No family/caregiver present to determine baseline cognitive functioning                                        Exercises      General Comments General comments (skin integrity, edema, etc.): Patient with reports of dizziness in sitting, returned to supine and  assessed BP - 105/65      Pertinent Vitals/Pain Pain Assessment: Faces Faces Pain Scale: No hurt    Home Living                      Prior Function            PT Goals (current goals can now be found in the care plan section) Acute Rehab PT Goals Patient Stated Goal: to move PT Goal Formulation: With patient Time For Goal Achievement: 08/31/20 Potential to Achieve Goals: Fair Progress towards PT goals: Progressing toward goals    Frequency    Min 2X/week      PT Plan  Current plan remains appropriate    Co-evaluation PT/OT/SLP Co-Evaluation/Treatment: Yes Reason for Co-Treatment: For patient/therapist safety;To address functional/ADL transfers PT goals addressed during session: Mobility/safety with mobility;Balance        AM-PAC PT "6 Clicks" Mobility   Outcome Measure  Help needed turning from your back to your side while in a flat bed without using bedrails?: A Little Help needed moving from lying on your back to sitting on the side of a flat bed without using bedrails?: A Lot Help needed moving to and from a bed to a chair (including a wheelchair)?: A Lot Help needed standing up from a chair using your arms (e.g., wheelchair or bedside chair)?: Total Help needed to walk in hospital room?: Total Help needed climbing 3-5 steps with a railing? : Total 6 Click Score: 10    End of Session   Activity Tolerance: Patient tolerated treatment well Patient left: in bed;with call bell/phone within reach;with bed alarm set Nurse Communication: Need for lift equipment;Mobility status PT Visit Diagnosis: Unsteadiness on feet (R26.81);Muscle weakness (generalized) (M62.81);Difficulty in walking, not elsewhere classified (R26.2);Other symptoms and signs involving the nervous system (R29.898)     Time: 4287-6811 PT Time Calculation (min) (ACUTE ONLY): 30 min  Charges:  $Therapeutic Activity: 8-22 mins                     Gamaliel Charney A. Gilford Rile PT, DPT Acute Rehabilitation Services Pager [phone] Office [phone]    Alda Lea 08/17/2020, 2:24 PM

## 2020-08-17 NOTE — Progress Notes (Signed)
  Progress Note    08/17/2020 7:55 AM 6 Days Post-Op  Subjective:  No complaints   Vitals:   08/17/20 0033 08/17/20 0452  BP: 112/62 105/61  Pulse: 79 77  Resp: 11 16  Temp: 99.2 F (37.3 C) 97.9 F (36.6 C)  SpO2: 96% 94%   Physical Exam: Lungs:  Non labored Incisions:  R TMA incision c/d/i; some darkening lateral dorsal foot skin edge Extremities:  Warm with good cap refill Neurologic: A&O  CBC    Component Value Date/Time   WBC 15.4 (H) 08/17/2020 0200   RBC 3.03 (L) 08/17/2020 0200   HGB 9.2 (L) 08/17/2020 0200   HGB 11.1 (L) 05/02/2020 1454   HCT 28.7 (L) 08/17/2020 0200   HCT 35.5 (L) 05/02/2020 1454   PLT 413 (H) 08/17/2020 0200   PLT 221 05/02/2020 1454   MCV 94.7 08/17/2020 0200   MCV 86 05/02/2020 1454   MCH 30.4 08/17/2020 0200   MCHC 32.1 08/17/2020 0200   RDW 14.9 08/17/2020 0200   RDW 15.7 (H) 05/02/2020 1454   LYMPHSABS 1.8 08/11/2020 2233   LYMPHSABS 1.3 05/02/2020 1454   MONOABS 0.9 08/11/2020 2233   EOSABS 0.0 08/11/2020 2233   EOSABS 0.0 05/02/2020 1454   BASOSABS 0.0 08/11/2020 2233   BASOSABS 0.0 05/02/2020 1454    BMET    Component Value Date/Time   NA 138 08/17/2020 0200   NA 137 12/10/2019 1439   K 3.9 08/17/2020 0200   CL 96 (L) 08/17/2020 0200   CO2 24 08/17/2020 0200   GLUCOSE 151 (H) 08/17/2020 0200   BUN 29 (H) 08/17/2020 0200   BUN 80 (HH) 12/10/2019 1439   CREATININE 5.18 (H) 08/17/2020 0200   CALCIUM 8.4 (L) 08/17/2020 0200   GFRNONAA 11 (L) 08/17/2020 0200   GFRAA 19 (L) 04/05/2020 0705    INR    Component Value Date/Time   INR 1.5 (H) 08/11/2020 0202     Intake/Output Summary (Last 24 hours) at 08/17/2020 0755 Last data filed at 08/16/2020 1510 Gross per 24 hour  Intake --  Output 574 ml  Net -574 ml     Assessment/Plan:  70 y.o. male is s/p R TMA 6 Days Post-Op   TMA site healing well Heal weightbearing only Plans noted for d/c to SNF on Thursday Follow up appt with [facility] 09/05/20    Dagoberto Ligas, PA-C [facility] [phone] 08/17/2020 7:55 AM

## 2020-08-17 NOTE — Progress Notes (Signed)
HD#19 Subjective:  Overnight: None  Patient is seen at bedside. He is sleeping but awake to verbal stimulation. He reports feeling fine w no complains.  Objective:   Vital signs in last 24 hours: Vitals:   08/16/20 2040 08/17/20 0033 08/17/20 0452 08/17/20 0500  BP: 99/61 112/62 105/61   Pulse: 80 79 77   Resp: 11 11 16    Temp: 16.1 F (37.1 C) 99.2 F (37.3 C) 97.9 F (36.6 C)   TempSrc: Oral Oral Oral   SpO2: 95% 96% 94%   Weight:    60.9 kg  Height:       Physical Exam Constitutional:manlying bed, sleeping easily arousable  Cardiovascular: Regular rate and rhythm, no mumurs Pulmonary/Chest: CTA, no respiratory distress. MSK: L BKA, RLE surgical wound healing right foot cool to touch Skin:warm, dry  Pertinent Labs: CBC Latest Ref Rng & Units 08/17/2020 08/16/2020 08/16/2020  WBC 4.0 - 10.5 K/uL 15.4(H) 14.4(H) 15.7(H)  Hemoglobin 13.0 - 17.0 g/dL 9.2(L) 8.7(L) 8.0(L)  Hematocrit 39.0 - 52.0 % 28.7(L) 26.9(L) 25.9(L)  Platelets 150 - 400 K/uL 413(H) 447(H) 448(H)    CMP Latest Ref Rng & Units 08/17/2020 08/16/2020 08/15/2020  Glucose 70 - 99 mg/dL 151(H) 175(H) 127(H)  BUN 8 - 23 mg/dL 29(H) 50(H) 32(H)  Creatinine 0.61 - 1.24 mg/dL 5.18(H) 7.49(H) 6.15(H)  Sodium 135 - 145 mmol/L 138 136 136  Potassium 3.5 - 5.1 mmol/L 3.9 4.3 4.2  Chloride 98 - 111 mmol/L 96(L) 91(L) 93(L)  CO2 22 - 32 mmol/L 24 26 24   Calcium 8.9 - 10.3 mg/dL 8.4(L) 8.6(L) 8.4(L)  Total Protein 6.5 - 8.1 g/dL - - -  Total Bilirubin 0.3 - 1.2 mg/dL - - -  Alkaline Phos 38 - 126 U/L - - -  AST 15 - 41 U/L - - -  ALT 0 - 44 U/L - - -    Imaging: ECHO TEE  Result Date: 08/08/2020   IMPRESSIONS  1. Left ventricular ejection fraction, by estimation, is 60 to 65%. The left ventricle has normal function. The left ventricle has no regional wall motion abnormalities.   2. Right ventricular systolic function is normal. The right ventricular size is normal.   3. Left atrial size was mildly dilated. No  left atrial/left atrial appendage thrombus was detected.   4. The mitral valve is normal in structure. Trivial mitral valve regurgitation. No evidence of mitral stenosis.   5. The aortic valve is normal in structure. Aortic valve regurgitation is not visualized. No aortic stenosis is present.   6. Aortic dilatation noted. There is borderline dilatation of the aortic root, measuring 38 mm. There is Moderate (Grade III) plaque involving the transverse and descending aorta.   7. Inadequate agitated saline injection to assess for interatrial shunt. Conclusion(s)/Recommendation(s): No evidence of vegetation/infective endocarditis on this transesophageal echocardiogram.      Assessment/Plan:   Principal Problem:   MSSA bacteremia Active Problems:   Diabetes mellitus with retinopathy of both eyes (HCC)   Hyperlipidemia   Anemia   Chronic combined systolic and diastolic heart failure (HCC)   ESRD (end stage renal disease) (HCC)   Acute cerebrovascular accident (CVA) due to ischemia (HCC)   PAD (peripheral artery disease) (HCC)   Orthostatic hypotension   S/P BKA (below knee amputation) ([place])   Syncope   Stroke ([place])   Dark stools   Gastroesophageal reflux disease with esophagitis   Nonerosive esophageal reflux disease   Hiatal hernia with GERD and esophagitis  Fever  Patient Summary:  Dennis Macias is a 70 y.o. man with history of ESRD on HD TTS, type 2 diabetes mellitus, history of CVA, hypertension, heart failure, and peripheral vascular disease on dual antiplatelet therapy who presented after syncopal episode at dialysis and admitted for MSSA bacteremia in the setting of R1st and R2nd digit ulcerations.   This is hospital day 66.  Sepsis secondary to MSSA bacteremia R 1st and 2nd digit ulcerations History of Peripheral vascular disease s/p L BKA S/p angioplasty of the R posterior tibial artery  S/p R TMA on 08/16/2020 Patient reports pain is well controlled. Surgical wound continues  to heal no bleeding, drainage or erythema. Ready for discharge to SNF pending placement.   - Dilaudid 0.5 mg IV q4  - IV cefazolin day 13 /14  (day 1: 1/27, last day 2/10), - On dual antiplatelet platelet therapy due to PVD.  - R heel weightbearing only with Draco shoe - PT/OT: SNF, pending placement  Syncopal episode Post-HD hypotension ESRD on HD (TTS) Acute ischemic left thalamic infarct History of multiple prior CVAs (2017, 2021) HTN, HLD No further syncope. Getting shorten sessions of HD. Continue with HD on TTS schedule - Continue ASA and Plavix - Continue atorvastatin 80 mg  - Continued HD while hospitalized. Appreciate nephrology's assistance.   Acute on chronic anemia FOBT positive Nonerosive esophagitis Stable today with hgb of 9.2 today.  - Protonix 40 mg daily  -Continue ESA - monitor CBC  Type 2 diabetes mellitus - Lantus 7 units - SSI sensitive - Carb/renal diet   Diet: renal/carb IVF: None VTE: Heparin Code: Full PT/OT recs: SNF for Subacute PT   Please contact the on call pager after 5 pm and on weekends at [phone].  Iona Beard, MD PGY-1 Internal Medicine Teaching Service Pager: [phone] 08/17/2020

## 2020-08-17 NOTE — Progress Notes (Signed)
Subjective:  No cos  PT in room   Objective Vital signs in last 24 hours: Vitals:   08/17/20 0452 08/17/20 0500 08/17/20 1100 08/17/20 1139  BP: 105/61  102/65 108/66  Pulse: 77  75 75  Resp: 16  16 12   Temp: 97.9 F (36.6 C)  98.5 F (36.9 C) 98.5 F (36.9 C)  TempSrc: Oral  Oral Axillary  SpO2: 94%  99% 96%  Weight:  60.9 kg    Height:       Weight change:    Physical Exam General:Alert, chronically ill appearing male in NAD Heart:RRR, no mrg Lungs:CTAB,unlabored breathingon RA Abdomen:Bowel sounds normoactive soft, NTND Extremities:no LE edema, L BKA, R TMA with sutures intact Dialysis Access:RU AVF +b/t  Dialysis Orders: TTS -[facility] 4hrs, BFR400, [id number], EDW 69kg,2K/2.25Ca Access:RU AVF Heparin4000 unit bolus Hectorol58mcg IV qHD  Problem/Plan: 1. Sepsis 2/2 MSSA bacteremia. ID consulted,TEE negative for endocarditis. Suspected source gangrenous toess/p TMA 08/11/20.BC from 1/27 negative. Cefazolin until 08/18/20.   Awaiting snhp  2. Syncopal episode -likely multifactorial - acute CVA, hypotension, and acute on chronic anemia. Not volume overloaded.No further syncope.Minimal UF with HD. 3. Acute CVA- seen on MRI. Thought to be related to cerebral hypoperfusion with hypotension post HD, as this was the cause of a past stroke. Per neuro/Admit 4. R 1st & 2nd digit ulcerations/Hx PVD: S/p RLE balloon angioplasty to PTA 05/29/20.VVS consulted, wentfor arteriogram01/28/22, followed by TMA on 08/11/20. High risk for BKA per Dr. Hartley Barefoot to operative findings. 5. ESRD:Continue TTS schedule. k 3.9 HD treatments shortened due to high patient census/staffing shortage. Not uremic or volume overloaded onexam.Next HD tomorrow  6. Hypertension/volume: BPmostlycontrolled.Belowdry wt with no volume overload on exam.Losing some weight withsepsis,NPO status/nausea. Lower dry on d/c.UF as tolerated, keep SBP>100. 7. Acute on chronic Anemiaof  CKD:+ FOBT,hgb 9.2 today / Hgb drop 11.3 (on 1/13 as OP),s/p 1 unit pRBC1/20and another unit on 1/24.GI consulted,underwent EGD1/25which showed esophagitis.Continue ESA.  Aranesp 40 given on 02/5 8. Secondary Hyperparathyroidism:Corrected calciumcontrolled, has been running high,Phos4.2andbinders discontinued,Hectorolon hold, follow Ca. 9. DMT2: per admitting team  Ernest Haber, PA-C [facility] [phone] 08/17/2020,3:14 PM  LOS: 19 days   Labs: Basic Metabolic Panel: Recent Labs  Lab 08/11/20 0853 08/12/20 0036 08/15/20 0140 08/16/20 0633 08/17/20 0200  NA 138   < > 136 136 138  K 4.7   < > 4.2 4.3 3.9  CL 97*   < > 93* 91* 96*  CO2 24   < > 24 26 24   GLUCOSE 137*   < > 127* 175* 151*  BUN 35*   < > 32* 50* 29*  CREATININE 5.77*   < > 6.15* 7.49* 5.18*  CALCIUM 8.3*   < > 8.4* 8.6* 8.4*  PHOS 4.2  --   --   --   --    < > = values in this interval not displayed.   Liver Function Tests: Recent Labs  Lab 08/11/20 0202 08/11/20 0853  AST 17  --   ALT <5  --   ALKPHOS 75  --   BILITOT 1.1  --   PROT 6.5  --   ALBUMIN 1.9* 1.9*   No results for input(s): LIPASE, AMYLASE in the last 168 hours. No results for input(s): AMMONIA in the last 168 hours. CBC: Recent Labs  Lab 08/11/20 2233 08/12/20 0036 08/14/20 0310 08/15/20 0140 08/16/20 0633 08/16/20 1129 08/17/20 0200  WBC 16.1*   < > 18.4* 15.3* 15.7* 14.4* 15.4*  NEUTROABS 13.1*  --   --   --   --   --   --   HGB 9.3*   < > 9.4* 10.1* 8.0* 8.7* 9.2*  HCT 29.6*   < > 30.6* 31.5* 25.9* 26.9* 28.7*  MCV 92.8   < > 94.7 93.2 94.5 93.4 94.7  PLT 480*   < > 418* 489* 448* 447* 413*   < > = values in this interval not displayed.   Cardiac Enzymes: No results for input(s): CKTOTAL, CKMB, CKMBINDEX, TROPONINI in the last 168 hours. CBG: Recent Labs  Lab 08/16/20 0816 08/16/20 1706 08/16/20 2054 08/17/20 0751 08/17/20 1136  GLUCAP 161* 149* 166* 134* 175*     Studies/Results: No results found. Medications: . sodium chloride 250 mL (08/06/20 0342)  .  ceFAZolin (ANCEF) IV 1 g (08/16/20 1847)   . aspirin EC  81 mg Oral Daily  . atorvastatin  80 mg Oral Daily  . Chlorhexidine Gluconate Cloth  6 each Topical Q0600  . clopidogrel  75 mg Oral Daily  . darbepoetin (ARANESP) injection - DIALYSIS  40 mcg Intravenous Q Sat-HD  . docusate sodium  100 mg Oral Daily  . dorzolamide-timolol  1 drop Right Eye BID  . finasteride  5 mg Oral Daily  . heparin injection (subcutaneous)  5,000 Units Subcutaneous Q8H  . insulin aspart  0-6 Units Subcutaneous TID WC  . insulin glargine  7 Units Subcutaneous QHS  . pantoprazole  40 mg Oral Daily  . polyethylene glycol  17 g Oral Daily  . sodium chloride flush  3 mL Intravenous Q12H

## 2020-08-17 NOTE — Progress Notes (Signed)
Occupational Therapy Treatment Patient Details Name: Dennis Macias MRN: [id number] DOB: February 19, 1951 Today's Date: 08/17/2020    History of present illness Pt is a 70 y/o male with PMH of L BKA, s/p balloon angioplasty of occluded vessels R foot 11/21, L ICA CVA (2017), NIDDM type II with retinopathy, CKD on hemodialysis, nephrotic syndrome, combined systolic and diastolic CHF, CAD w/ NSTEMI, HTN, HLD, tubular adenmoa of colon, tachycardia, syncope, PVD, and protein malnutrition who presents after a syncopal episode at hemodialysis. His R foot wounds continue to worsen with noted dry gangrene on 1st and 2nd toes. now s/p angioplasty of the right posterior tibial artery on 1/27; MRI revealed acute ischemic nonhemorrhagic L thalamic infarct and multiple chronic micro hemorrhages scattered throughout both cerebral hemispheres; R TMA 08/11/20.   OT comments  Pt seen in conjunction with PT to maxmize pts activity tolerance and optimize pts participation. Pt limited by impaired balance, generalized weakness and decreased activity tolerance. Pt very HOH needing increased time to follow commands, pt with no awareness to incontinent BM. MIN A to roll R<>L for pericare, total A for posterior pericare. MAX A +2 to transition to EOB and up to MAX A for static sitting balance. Patient with reports of dizziness in sitting, returned to supine and assessed BP - 105/65. Pt would continue to benefit from skilled occupational therapy while admitted and after d/c to address the below listed limitations in order to improve overall functional mobility and facilitate independence with BADL participation. DC plan remains appropriate, will follow acutely per POC.    Follow Up Recommendations  SNF;Supervision/Assistance - 24 hour;Home health OT    Equipment Recommendations  None recommended by OT    Recommendations for Other Services      Precautions / Restrictions Precautions Precautions: Fall Precaution Comments: hx of L BKA;  HOH Required Braces or Orthoses: Other Brace Other Brace: darco shoe on Rt Restrictions Weight Bearing Restrictions: Yes RLE Weight Bearing: Weight bearing as tolerated (thru heel only) LLE Weight Bearing: Non weight bearing       Mobility Bed Mobility Overal bed mobility: Needs Assistance Bed Mobility: Supine to Sit;Sit to Supine;Rolling Rolling: Min assist   Supine to sit: Max assist;+2 for safety/equipment;+2 for physical assistance Sit to supine: Min assist   General bed mobility comments: maxA+2 to perform supine>sit with difficulty sequencing, cues for hand placement to assist with sitting up. MinA for rolling to perform pericare  Transfers                 General transfer comment: Deferred transfers this session due to patient requiring maxA to maintain sitting balance on EOB    Balance Overall balance assessment: Needs assistance Sitting-balance support: Bilateral upper extremity supported;Feet supported Sitting balance-Leahy Scale: Poor Sitting balance - Comments: maxA to maintain sitting balance. Poor trunk control with LOB anterior, posterior and right. Overshooting when asked to correct positioning                                   ADL either performed or assessed with clinical judgement   ADL Overall ADL's : Needs assistance/impaired             Lower Body Bathing: Total assistance;Bed level Lower Body Bathing Details (indicate cue type and reason): simulated via posterior pericare from bed level           Toilet Transfer Details (indicate cue type and reason): unable to attempt  Toileting- Clothing Manipulation and Hygiene: Total assistance;Bed level Toileting - Clothing Manipulation Details (indicate cue type and reason): total A for posterior pericare from supine     Functional mobility during ADLs: Maximal assistance;+2 for physical assistance (bed mobility only) General ADL Comments: pt with impaired sitting balance this  session needing up to MAX A for static sitting balance EOB, deferred further transfer training d/t impaired balance and incontinent BM     Vision Baseline Vision/History: Retinopathy;Wears glasses;Cataracts Additional Comments: Pt with noticeable cataractin Rt eye. noted some mild visual deficits in relation to mobility tasks however pt able to self feed with set- up assist   Perception     Praxis      Cognition Arousal/Alertness: Awake/alert Behavior During Therapy: Flat affect Overall Cognitive Status: No family/caregiver present to determine baseline cognitive functioning                                 General Comments: Pt HOH so requires repetition to follow commands, answer questions. Limited to no initiation/assist with mobility today despite cues.        Exercises     Shoulder Instructions       General Comments Patient with reports of dizziness in sitting, returned to supine and assessed BP - 105/65    Pertinent Vitals/ Pain       Pain Assessment: Faces Faces Pain Scale: No hurt  Home Living                                          Prior Functioning/Environment              Frequency  Min 2X/week        Progress Toward Goals  OT Goals(current goals can now be found in the care plan section)  Progress towards OT goals: Progressing toward goals  Acute Rehab OT Goals Patient Stated Goal: to move OT Goal Formulation: With patient Time For Goal Achievement: 08/26/20 Potential to Achieve Goals: [place] Discharge plan remains appropriate;Frequency remains appropriate    Co-evaluation      Reason for Co-Treatment: For patient/therapist safety;To address functional/ADL transfers PT goals addressed during session: Mobility/safety with mobility;Balance        AM-PAC OT "6 Clicks" Daily Activity     Outcome Measure   Help from another person eating meals?: A Little Help from another person taking care of  personal grooming?: A Little Help from another person toileting, which includes using toliet, bedpan, or urinal?: A Lot Help from another person bathing (including washing, rinsing, drying)?: A Lot Help from another person to put on and taking off regular upper body clothing?: A Little Help from another person to put on and taking off regular lower body clothing?: Total 6 Click Score: 14    End of Session    OT Visit Diagnosis: Muscle weakness (generalized) (M62.81);Other symptoms and signs involving cognitive function;Pain   Activity Tolerance Patient tolerated treatment well   Patient Left in bed;with call bell/phone within reach;with bed alarm set;Other (comment) (sitting up right in bed eating lunch)   Nurse Communication Mobility status        Time: 3790-2409 OT Time Calculation (min): 30 min  Charges: OT General Charges $OT Visit: 1 Visit OT Treatments $Self Care/Home Management : 8-22 mins  Corinne Ports K., COTA/L Acute  Rehabilitation Services [place]    Precious Haws 08/17/2020, 4:45 PM

## 2020-08-18 LAB — GLUCOSE, CAPILLARY
Glucose-Capillary: 158 mg/dL — ABNORMAL HIGH (ref 70–99)
Glucose-Capillary: 141 mg/dL — ABNORMAL HIGH (ref 70–99)
Glucose-Capillary: 173 mg/dL — ABNORMAL HIGH (ref 70–99)

## 2020-08-18 LAB — CBC
HCT: 28.1 % — ABNORMAL LOW (ref 39.0–52.0)
Hemoglobin: 8.5 g/dL — ABNORMAL LOW (ref 13.0–17.0)
MCH: 29.2 pg (ref 26.0–34.0)
MCHC: 30.2 g/dL (ref 30.0–36.0)
MCV: 96.6 fL (ref 80.0–100.0)
Platelets: 398 10*3/uL (ref 150–400)
RBC: 2.91 MIL/uL — ABNORMAL LOW (ref 4.22–5.81)
RDW: 15.4 % (ref 11.5–15.5)
WBC: 14.1 10*3/uL — ABNORMAL HIGH (ref 4.0–10.5)
nRBC: 0 % (ref 0.0–0.2)

## 2020-08-18 LAB — BASIC METABOLIC PANEL
Anion gap: 19 — ABNORMAL HIGH (ref 5–15)
BUN: 49 mg/dL — ABNORMAL HIGH (ref 8–23)
CO2: 25 mmol/L (ref 22–32)
Calcium: 8.8 mg/dL — ABNORMAL LOW (ref 8.9–10.3)
Chloride: 95 mmol/L — ABNORMAL LOW (ref 98–111)
Creatinine, Ser: 6.99 mg/dL — ABNORMAL HIGH (ref 0.61–1.24)
GFR, Estimated: 8 mL/min — ABNORMAL LOW (ref >60)
Glucose, Bld: 156 mg/dL — ABNORMAL HIGH (ref 70–99)
Potassium: 3.8 mmol/L (ref 3.5–5.1)
Sodium: 139 mmol/L (ref 135–145)

## 2020-08-18 NOTE — Progress Notes (Signed)
Patient's daughter Jacobi Nile called to report concern about the patient's mental state. She states that the patient is not speaking to his family, and when his wife visits him at bedside, that he is not speaking to her while she is there. She is concerned that he may be depressed and she has not been able to speak with a physician about his condition. Notified Konrad Penta, MD and gave the daughter's contact information. MD stated she will return a call to the daughter as soon as possible. Daughter was called by this RN to notify to expect a call.

## 2020-08-18 NOTE — Progress Notes (Incomplete)
HD#20 Subjective:  Overnight: None  Patient is seen at bedside. Awake this morning with no acute complaints. Has not had breakfast today, but will try later when more hungry.   Objective:   Vital signs in last 24 hours: Vitals:   08/17/20 2333 08/18/20 0500 08/18/20 0528 08/18/20 0900  BP: (!) 100/57  132/68 126/71  Pulse: 72  72 71  Resp: 17  11 15   Temp: 97.8 F (36.6 C)  98.8 F (37.1 C) 98.4 F (36.9 C)  TempSrc: Oral  Oral Oral  SpO2: 98%  100% 98%  Weight:  62.5 kg    Height:       Physical Exam Constitutional:manlying bed, sleeping easily arousable  Cardiovascular: Regular rate and rhythm, no mumurs Pulmonary/Chest: CTA, no respiratory distress. MSK: L BKA, RLE surgical wound small amount of bleeding no erythema no warmth.  Skin:warm, dry  Pertinent Labs: CBC Latest Ref Rng & Units 08/18/2020 08/17/2020 08/16/2020  WBC 4.0 - 10.5 K/uL 14.1(H) 15.4(H) 14.4(H)  Hemoglobin 13.0 - 17.0 g/dL 8.5(L) 9.2(L) 8.7(L)  Hematocrit 39.0 - 52.0 % 28.1(L) 28.7(L) 26.9(L)  Platelets 150 - 400 K/uL 398 413(H) 447(H)    CMP Latest Ref Rng & Units 08/18/2020 08/17/2020 08/16/2020  Glucose 70 - 99 mg/dL 156(H) 151(H) 175(H)  BUN 8 - 23 mg/dL 49(H) 29(H) 50(H)  Creatinine 0.61 - 1.24 mg/dL 6.99(H) 5.18(H) 7.49(H)  Sodium 135 - 145 mmol/L 139 138 136  Potassium 3.5 - 5.1 mmol/L 3.8 3.9 4.3  Chloride 98 - 111 mmol/L 95(L) 96(L) 91(L)  CO2 22 - 32 mmol/L 25 24 26   Calcium 8.9 - 10.3 mg/dL 8.8(L) 8.4(L) 8.6(L)  Total Protein 6.5 - 8.1 g/dL - - -  Total Bilirubin 0.3 - 1.2 mg/dL - - -  Alkaline Phos 38 - 126 U/L - - -  AST 15 - 41 U/L - - -  ALT 0 - 44 U/L - - -    Imaging: ECHO TEE  Result Date: 08/08/2020   IMPRESSIONS  1. Left ventricular ejection fraction, by estimation, is 60 to 65%. The left ventricle has normal function. The left ventricle has no regional wall motion abnormalities.   2. Right ventricular systolic function is normal. The right ventricular size is normal.    3. Left atrial size was mildly dilated. No left atrial/left atrial appendage thrombus was detected.   4. The mitral valve is normal in structure. Trivial mitral valve regurgitation. No evidence of mitral stenosis.   5. The aortic valve is normal in structure. Aortic valve regurgitation is not visualized. No aortic stenosis is present.   6. Aortic dilatation noted. There is borderline dilatation of the aortic root, measuring 38 mm. There is Moderate (Grade III) plaque involving the transverse and descending aorta.   7. Inadequate agitated saline injection to assess for interatrial shunt. Conclusion(s)/Recommendation(s): No evidence of vegetation/infective endocarditis on this transesophageal echocardiogram.      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 man with history of ESRD on HD TTS, type 2 diabetes mellitus, history of CVA, hypertension, heart failure, and peripheral vascular disease on dual antiplatelet therapy who presented after syncopal episode at dialysis and admitted for MSSA bacteremia in the setting of R1st and R2nd digit ulcerations sp TMA on 2/8.   This is hospital day 20.  Sepsis secondary to MSSA bacteremia R 1st and 2nd digit ulcerations History of Peripheral vascular disease s/p L BKA S/p angioplasty of the R posterior tibial artery  S/p R TMA on 08/16/2020 No  acute complaints from patient. Wound appears to be healing. Small amount of blood present today. No signs of infection. Will complete antibiotics today. Hgb stable. Ready for discharge pending bed placement at SNF. - Dilaudid 0.5 mg IV q4  - IV cefazolin day 14 /14  (day 1: 1/27, last day 2/10), - On dual antiplatelet platelet therapy due to PVD.  - R heel weightbearing only with Draco shoe - PT/OT: SNF, pending placement  Syncopal episode Post-HD hypotension ESRD on HD (TTS) Acute ischemic left thalamic infarct History of multiple prior CVAs (2017, 2021) HTN, HLD No further syncope. Getting shorten sessions of HD. Continue with HD on TTS schedule. Dialysis today - Continue ASA and Plavix - Continue atorvastatin 80 mg  - Continued HD while hospitalized. Appreciate nephrology's assistance.   Acute on chronic anemia FOBT positive Nonerosive esophagitis Stable today with hgb of 9.2 today.  - Protonix 40 mg daily  - Continue ESA - monitor CBC  Type 2 diabetes mellitus - Lantus 7 units - SSI sensitive - Carb/renal diet   Diet: renal/carb IVF: None VTE: Heparin Code: Full PT/OT recs: SNF for Subacute PT   Please contact the on call pager after 5 pm and on weekends at [phone].  Iona Beard, MD PGY-1 Internal Medicine Teaching Service Pager: [phone] 08/18/2020

## 2020-08-18 NOTE — Progress Notes (Signed)
Called and spoke with patient's daughter, [name], to address her questions and concerns. Her primary concern is her father's mental state. She feels like he is either depressed or showing signs of dementia. She is worried he hasn't really been eating and has stopped taking family phone calls.  I explained that he does have a lot going on medically with likely some hospital delirium. Based on her reports, I agreed that he is likely showing signs of vascular dementia. I assured her it will be important to follow-up in clinic to assess how he is doing after rehab where we can better assess cognition and screen for depression in the outpatient setting.   Modena Nunnery D, DO PGY-3 08/18/20, 10:45 PM

## 2020-08-18 NOTE — TOC Progression Note (Signed)
Transition of Care [facility]) - Progression Note    Patient Details  Name: Conlee Sliter MRN: [id number] Date of Birth: 03-13-1951  Transition of Care [facility]) CM/SW [place], [place] Phone Number: 08/18/2020, 12:43 PM  Clinical Narrative:     CSW spoke with Juliann Pulse at [facility] that confirmed they cannot accept patient for SNF placement todayPatient is 6 days away from being able to be boosterd. [facility] can offer a quarantined SNF bed possibly tomorrow. CSW updated patients significant other. CSW informed MD.  CSW will continue to follow.    Expected Discharge Plan: [place] Barriers to Discharge: Continued Medical Work up  Expected Discharge Plan and Services Expected Discharge Plan: [place] arrangements for the past 2 months: [place] Expected Discharge Date: 08/03/20                         HH Arranged: PT,OT [place]: [place] Date [facility] Agency Contacted: 08/03/20   Representative spoke with at [place]: [place] ([place]) Interventions    Readmission Risk Interventions Readmission Risk Prevention Plan 01/19/2020 11/10/2019 08/10/2019  Transportation Screening Complete Complete Complete  PCP or Specialist Appt within 3-5 Days - - Complete  HRI or [place] - - Complete  Social Work Consult for [place] Planning/Counseling - - Complete  Palliative Care Screening - - Complete  Medication Review Press photographer) Complete Complete Complete  PCP or Specialist appointment within 3-5 days of discharge Complete - -  [place] or Home Care Consult Complete Complete -  SW Recovery Care/Counseling Consult Complete Complete -  Palliative Care Screening Complete Not Applicable -  [place] Complete Complete -  Some recent data might be hidden

## 2020-08-18 NOTE — Progress Notes (Addendum)
   TMA has incision line darkening, foot dorsum is warm to touch, distal is cooler. Lungs:  Non labored   Assessment/Plan:  70 y.o. male is s/p R TMA 7 Days Post-Op  TMA site appears viable currently Heel weight bearing with Darco shoe Follow up appt with [facility] 09/05/20  Roxy Horseman PA-C [facility] [phone]

## 2020-08-18 NOTE — Progress Notes (Signed)
Occupational Therapy Treatment Patient Details Name: Dennis Macias MRN: [id number] DOB: January 03, 1951 Today's Date: 08/18/2020    History of present illness Pt is a 70 y/o male with PMH of L BKA, s/p balloon angioplasty of occluded vessels R foot 11/21, L ICA CVA (2017), NIDDM type II with retinopathy, CKD on hemodialysis, nephrotic syndrome, combined systolic and diastolic CHF, CAD w/ NSTEMI, HTN, HLD, tubular adenmoa of colon, tachycardia, syncope, PVD, and protein malnutrition who presents after a syncopal episode at hemodialysis. His R foot wounds continue to worsen with noted dry gangrene on 1st and 2nd toes. now s/p angioplasty of the right posterior tibial artery on 1/27; MRI revealed acute ischemic nonhemorrhagic L thalamic infarct and multiple chronic micro hemorrhages scattered throughout both cerebral hemispheres; R TMA 08/11/20.   OT comments  Pt making gradual progress towards OT goals this session. Continued work on sitting balance and BADL reeducation however pt continues to present with impaired sitting balance and self limiting behaviors wanting to lay back down throughout session. Pt currently requires MOD- MAX A +2 for bed mobility, MOD A for static sitting balance and total A for ADLs at this time. Pt would continue to benefit from skilled occupational therapy while admitted and after d/c to address the below listed limitations in order to improve overall functional mobility and facilitate independence with BADL participation. DC plan remains appropriate, will follow acutely per POC.     Follow Up Recommendations  SNF;Supervision/Assistance - 24 hour;Home health OT    Equipment Recommendations  None recommended by OT    Recommendations for Other Services      Precautions / Restrictions Precautions Precautions: Fall Precaution Comments: hx of L BKA; HOH Required Braces or Orthoses: Other Brace Other Brace: darco shoe on Rt Restrictions Weight Bearing Restrictions: Yes RLE  Weight Bearing: Weight bearing as tolerated (thru heel) LLE Weight Bearing: Non weight bearing       Mobility Bed Mobility Overal bed mobility: Needs Assistance Bed Mobility: Supine to Sit;Sit to Supine;Rolling Rolling: Min assist   Supine to sit: Max assist;+2 for safety/equipment;+2 for physical assistance Sit to supine: Mod assist;+2 for physical assistance   General bed mobility comments: pt requried MAX A +2 to transition into sitting from supine, cues for hand placement and assist to elevate trunk in sitting, pt resistant this session wanting to lay back down after sitting EOB ~ 3 mins, MOD A to return pt to supine needs assist to elevate BLEs back to bed  Transfers                      Balance Overall balance assessment: Needs assistance Sitting-balance support: Bilateral upper extremity supported;Feet supported Sitting balance-Leahy Scale: Poor Sitting balance - Comments: MOD A to maintain upright static sitting balance with pt resistant to task only able to sit EOB ~ 3 mins Postural control: Posterior lean                                 ADL either performed or assessed with clinical judgement   ADL Overall ADL's : Needs assistance/impaired     Grooming: Wash/dry face;Sitting Grooming Details (indicate cue type and reason): pt requried total A to wash face from sitting EOB     Lower Body Bathing: Total assistance;Bed level Lower Body Bathing Details (indicate cue type and reason): simulated via posterior pericare from bed level     Lower Body Dressing: Total assistance;Bed  level Lower Body Dressing Details (indicate cue type and reason): to don [place]   Toilet Transfer Details (indicate cue type and reason): unable to attempt Toileting- Clothing Manipulation and Hygiene: Total assistance;Bed level Toileting - Clothing Manipulation Details (indicate cue type and reason): total A for posterior pericare from supine     Functional mobility  during ADLs: Maximal assistance;+2 for physical assistance (bed mobility only) General ADL Comments: pt continues to present with impaired sitting balance, decreased ability to care for self and decreased awareness into deficits     Vision       Perception     Praxis      Cognition Arousal/Alertness: Awake/alert Behavior During Therapy: Flat affect (making minimal efforts to verbalize) Overall Cognitive Status: No family/caregiver present to determine baseline cognitive functioning                                 General Comments: Pt HOH so requires repetition to follow commands, pt answering questions with repeated cues. no awareness into having had BM in bed, pt attempting to lay himself back down during session but unable to state why he needs to lay down        Exercises     Shoulder Instructions       General Comments pt with incontinent BM during session, changed bed sheets and pads and completed pericare, pt with slight bleeding in RLE amputation incision, donned gauze over bleeding for protection when placing darco shoe.    Pertinent Vitals/ Pain       Pain Assessment: No/denies pain  Home Living                                          Prior Functioning/Environment              Frequency  Min 2X/week        Progress Toward Goals  OT Goals(current goals can now be found in the care plan section)  Progress towards OT goals: Progressing toward goals  Acute Rehab OT Goals Time For Goal Achievement: 08/26/20 Potential to Achieve Goals: [place] Discharge plan remains appropriate;Frequency remains appropriate    Co-evaluation                 AM-PAC OT "6 Clicks" Daily Activity     Outcome Measure   Help from another person eating meals?: A Little Help from another person taking care of personal grooming?: A Lot Help from another person toileting, which includes using toliet, bedpan, or urinal?: Total Help  from another person bathing (including washing, rinsing, drying)?: A Lot Help from another person to put on and taking off regular upper body clothing?: A Lot Help from another person to put on and taking off regular lower body clothing?: Total 6 Click Score: 11    End of Session    OT Visit Diagnosis: Muscle weakness (generalized) (M62.81);Other symptoms and signs involving cognitive function;Pain   Activity Tolerance Patient limited by lethargy;Other (comment) (self limiting ( laying self back down))   Patient Left in bed;with call bell/phone within reach;with bed alarm set   Nurse Communication Mobility status        Time: 4888-9169 OT Time Calculation (min): 20 min  Charges: OT General Charges $OT Visit: 1 Visit OT Treatments $Self Care/Home Management : 8-22 mins  Harley Alto., COTA/L Acute Rehabilitation Services [phone]    Precious Haws 08/18/2020, 11:00 AM

## 2020-08-18 NOTE — Progress Notes (Signed)
HD#20 Subjective:  Overnight: None  Patient is seen at bedside. Awake this morning with no acute complaints. Has not had breakfast today, but will try later when more hungry.   Objective:   Vital signs in last 24 hours: Vitals:   08/17/20 2333 08/18/20 0500 08/18/20 0528 08/18/20 0900  BP: (!) 100/57  132/68 126/71  Pulse: 72  72 71  Resp: 17  11 15   Temp: 97.8 F (36.6 C)  98.8 F (37.1 C) 98.4 F (36.9 C)  TempSrc: Oral  Oral Oral  SpO2: 98%  100% 98%  Weight:  62.5 kg    Height:       Physical Exam Constitutional:manlying bed, sleeping easily arousable  Cardiovascular: Regular rate and rhythm, no mumurs Pulmonary/Chest: CTA, no respiratory distress. MSK: L BKA, RLE surgical wound small amount of bleeding no erythema no warmth.  Skin:warm, dry  Pertinent Labs: CBC Latest Ref Rng & Units 08/18/2020 08/17/2020 08/16/2020  WBC 4.0 - 10.5 K/uL 14.1(H) 15.4(H) 14.4(H)  Hemoglobin 13.0 - 17.0 g/dL 8.5(L) 9.2(L) 8.7(L)  Hematocrit 39.0 - 52.0 % 28.1(L) 28.7(L) 26.9(L)  Platelets 150 - 400 K/uL 398 413(H) 447(H)    CMP Latest Ref Rng & Units 08/18/2020 08/17/2020 08/16/2020  Glucose 70 - 99 mg/dL 156(H) 151(H) 175(H)  BUN 8 - 23 mg/dL 49(H) 29(H) 50(H)  Creatinine 0.61 - 1.24 mg/dL 6.99(H) 5.18(H) 7.49(H)  Sodium 135 - 145 mmol/L 139 138 136  Potassium 3.5 - 5.1 mmol/L 3.8 3.9 4.3  Chloride 98 - 111 mmol/L 95(L) 96(L) 91(L)  CO2 22 - 32 mmol/L 25 24 26   Calcium 8.9 - 10.3 mg/dL 8.8(L) 8.4(L) 8.6(L)  Total Protein 6.5 - 8.1 g/dL - - -  Total Bilirubin 0.3 - 1.2 mg/dL - - -  Alkaline Phos 38 - 126 U/L - - -  AST 15 - 41 U/L - - -  ALT 0 - 44 U/L - - -    Imaging: ECHO TEE  Result Date: 08/08/2020   IMPRESSIONS  1. Left ventricular ejection fraction, by estimation, is 60 to 65%. The left ventricle has normal function. The left ventricle has no regional wall motion abnormalities.   2. Right ventricular systolic function is normal. The right ventricular size is normal.    3. Left atrial size was mildly dilated. No left atrial/left atrial appendage thrombus was detected.   4. The mitral valve is normal in structure. Trivial mitral valve regurgitation. No evidence of mitral stenosis.   5. The aortic valve is normal in structure. Aortic valve regurgitation is not visualized. No aortic stenosis is present.   6. Aortic dilatation noted. There is borderline dilatation of the aortic root, measuring 38 mm. There is Moderate (Grade III) plaque involving the transverse and descending aorta.   7. Inadequate agitated saline injection to assess for interatrial shunt. Conclusion(s)/Recommendation(s): No evidence of vegetation/infective endocarditis on this transesophageal echocardiogram.      Assessment/Plan:   Principal Problem:   MSSA bacteremia Active Problems:   Diabetes mellitus with retinopathy of both eyes (HCC)   Hyperlipidemia   Anemia   Chronic combined systolic and diastolic heart failure (HCC)   ESRD (end stage renal disease) (HCC)   Acute cerebrovascular accident (CVA) due to ischemia (HCC)   PAD (peripheral artery disease) (HCC)   Orthostatic hypotension   S/P BKA (below knee amputation) ([place])   Syncope   Stroke ([place])   Dark stools   Gastroesophageal reflux disease with esophagitis   Nonerosive esophageal reflux disease  Hiatal hernia with GERD and esophagitis   Fever  Patient Summary:  Dennis Macias is a 70 y.o. man with history of ESRD on HD TTS, type 2 diabetes mellitus, history of CVA, hypertension, heart failure, and peripheral vascular disease on dual antiplatelet therapy who presented after syncopal episode at dialysis and admitted for MSSA bacteremia in the setting of R1st and R2nd digit ulcerations sp TMA on 2/8.   This is hospital day 20.  Sepsis secondary to MSSA bacteremia R 1st and 2nd digit ulcerations History of Peripheral vascular disease s/p L BKA S/p angioplasty of the R posterior tibial artery  S/p R TMA on 08/16/2020 No  acute complaints from patient. Wound appears to be healing. Small amount of blood present today. No signs of infection. Will complete antibiotics today. Hgb stable. Ready for discharge pending bed placement at SNF. - Dilaudid 0.5 mg IV q4  - IV cefazolin day 14 /14  (day 1: 1/27, last day 2/10), - On dual antiplatelet platelet therapy due to PVD.  - R heel weightbearing only with Draco shoe - PT/OT: SNF, pending placement  Syncopal episode Post-HD hypotension ESRD on HD (TTS) Acute ischemic left thalamic infarct History of multiple prior CVAs (2017, 2021) HTN, HLD No further syncope. Getting shorten sessions of HD. Continue with HD on TTS schedule. Dialysis today - Continue ASA and Plavix - Continue atorvastatin 80 mg  - Continued HD while hospitalized. Appreciate nephrology's assistance.   Acute on chronic anemia FOBT positive Nonerosive esophagitis Stable today with hgb of 8.5 today.  - Protonix 40 mg daily  - Continue ESA - monitor CBC  Type 2 diabetes mellitus - Lantus 7 units - SSI sensitive - Carb/renal diet   Diet: renal/carb IVF: None VTE: Heparin Code: Full PT/OT recs: SNF for Subacute PT   Please contact the on call pager after 5 pm and on weekends at [phone].  Iona Beard, MD PGY-1 Internal Medicine Teaching Service Pager: [phone] 08/18/2020

## 2020-08-18 NOTE — Progress Notes (Addendum)
Subjective: Seen and examined in room no complaints, noted for dialysis today, possible discharge to SNF  Objective Vital signs in last 24 hours: Vitals:   08/17/20 2333 08/18/20 0500 08/18/20 0528 08/18/20 0900  BP: (!) 100/57  132/68 126/71  Pulse: 72  72 71  Resp: 17  11 15   Temp: 97.8 F (36.6 C)  98.8 F (37.1 C) 98.4 F (36.9 C)  TempSrc: Oral  Oral Oral  SpO2: 98%  100% 98%  Weight:  62.5 kg    Height:       Weight change: 0.7 kg  Physical Exam General:Alert, chronically ill appearing male in NAD Heart:RRR, no mrg Lungs:CTAB,unlabored breathingon RA Abdomen:Bowel sounds normoactive soft, NTND Extremities:no LE edema, L BKA, R TMA with sutures intact, right first and second digit ulcerations dry  Dialysis Access:RU AVF +b/t  Dialysis Orders: TTS -[facility] 4hrs, BFR400, [id number], EDW 69kg,2K/2.25Ca Access:RU AVF Heparin4000 unit bolus Hectorol15mcg IV qHD  Problem/Plan: 1. Sepsis 2/2 MSSA bacteremia. ID consulted,TEE negative for endocarditis. Suspected source gangrenous toess/p TMA 08/11/20.BC from 1/27 negative. Cefazolin until 08/18/20.  For  snhp  2. Syncopal episode -likely multifactorial - acute CVA, hypotension, and acute on chronic anemia. Not volume overloaded.No further syncope.Minimal UF with HD. 3. Acute CVA- seen on MRI. Thought to be related to cerebral hypoperfusion with hypotension post HD, as this was the cause of a past stroke. Per neuro/Admit 4. R 1st & 2nd digit ulcerations/Hx PVD: S/p RLE balloon angioplasty to PTA 05/29/20.[facility] consulted, wentfor arteriogram01/28/22, followed by TMA on 08/11/20. High risk for BKA per Dr. Hartley Barefoot to operative findings. 5. ESRD:Continue TTS schedule. k 3.9 HD treatments shortened due to high patient census/staffing shortage. Not uremic or volume overloaded onexam. 6. Hypertension/volume: BPmostlycontrolled.Belowdry wt with no volume overload on exam.Losing some weight  withsepsis,NPO status/nausea. Lower dry on d/c.UF as tolerated, keep SBP>100. 7. Acute on chronic Anemiaof CKD:+ FOBT,hgb 8.5 today / Hgb drop 11.3 (on 1/13 as OP),s/p 1 unit pRBC1/20and another unit on 1/24.GI consulted,underwent EGD1/25which showed esop hagitis.Continue ESA.Aranesp 40 given on 02/5, at outpatient will start Mircera 60 every 2 weeks, follow-up Hgb trend, now off IV antibiotics follow-up iron studies in outpatient 8. Secondary Hyperparathyroidism:Corrected calcium10.5 today, has been running high,Phos4.2andbinders discontinued,Hectorolon hold, outpatient follow-up Ca/phos trend at kidney center 9. DMT2: per admitting team  Ernest Haber, PA-C [facility] Beeper [phone] 08/18/2020,9:26 AM  LOS: 20 days   Labs: Basic Metabolic Panel: Recent Labs  Lab 08/16/20 0633 08/17/20 0200 08/18/20 0754  NA 136 138 139  K 4.3 3.9 3.8  CL 91* 96* 95*  CO2 26 24 25   GLUCOSE 175* 151* 156*  BUN 50* 29* 49*  CREATININE 7.49* 5.18* 6.99*  CALCIUM 8.6* 8.4* 8.8*   Liver Function Tests: No results for input(s): AST, ALT, ALKPHOS, BILITOT, PROT, ALBUMIN in the last 168 hours. No results for input(s): LIPASE, AMYLASE in the last 168 hours. No results for input(s): AMMONIA in the last 168 hours. CBC: Recent Labs  Lab 08/11/20 2233 08/12/20 0036 08/15/20 0140 08/16/20 0633 08/16/20 1129 08/17/20 0200 08/18/20 0754  WBC 16.1*   < > 15.3* 15.7* 14.4* 15.4* 14.1*  NEUTROABS 13.1*  --   --   --   --   --   --   HGB 9.3*   < > 10.1* 8.0* 8.7* 9.2* 8.5*  HCT 29.6*   < > 31.5* 25.9* 26.9* 28.7* 28.1*  MCV 92.8   < > 93.2 94.5 93.4 94.7 96.6  PLT 480*   < >  489* 448* 447* 413* 398   < > = values in this interval not displayed.   Cardiac Enzymes: No results for input(s): CKTOTAL, CKMB, CKMBINDEX, TROPONINI in the last 168 hours. CBG: Recent Labs  Lab 08/17/20 0751 08/17/20 1136 08/17/20 1630 08/17/20 2146 08/18/20 0741  GLUCAP  134* 175* 218* 185* 158*    Studies/Results: No results found. Medications: . sodium chloride 250 mL (08/06/20 0342)  .  ceFAZolin (ANCEF) IV 1 g (08/17/20 1829)   . aspirin EC  81 mg Oral Daily  . atorvastatin  80 mg Oral Daily  . Chlorhexidine Gluconate Cloth  6 each Topical Q0600  . clopidogrel  75 mg Oral Daily  . darbepoetin (ARANESP) injection - DIALYSIS  40 mcg Intravenous Q Sat-HD  . docusate sodium  100 mg Oral Daily  . dorzolamide-timolol  1 drop Right Eye BID  . finasteride  5 mg Oral Daily  . heparin injection (subcutaneous)  5,000 Units Subcutaneous Q8H  . insulin aspart  0-6 Units Subcutaneous TID WC  . insulin glargine  7 Units Subcutaneous QHS  . pantoprazole  40 mg Oral Daily  . polyethylene glycol  17 g Oral Daily  . sodium chloride flush  3 mL Intravenous Q12H

## 2020-08-18 NOTE — Telephone Encounter (Signed)
Pt is currently in the hospital waiting to be discharge to SNF.

## 2020-08-19 LAB — BASIC METABOLIC PANEL
Anion gap: 16 — ABNORMAL HIGH (ref 5–15)
BUN: 27 mg/dL — ABNORMAL HIGH (ref 8–23)
CO2: 27 mmol/L (ref 22–32)
Calcium: 8.4 mg/dL — ABNORMAL LOW (ref 8.9–10.3)
Chloride: 94 mmol/L — ABNORMAL LOW (ref 98–111)
Creatinine, Ser: 4.7 mg/dL — ABNORMAL HIGH (ref 0.61–1.24)
GFR, Estimated: 13 mL/min — ABNORMAL LOW (ref >60)
Glucose, Bld: 194 mg/dL — ABNORMAL HIGH (ref 70–99)
Potassium: 4.1 mmol/L (ref 3.5–5.1)
Sodium: 137 mmol/L (ref 135–145)

## 2020-08-19 LAB — CBC
HCT: 31.5 % — ABNORMAL LOW (ref 39.0–52.0)
Hemoglobin: 10.1 g/dL — ABNORMAL LOW (ref 13.0–17.0)
MCH: 30.5 pg (ref 26.0–34.0)
MCHC: 32.1 g/dL (ref 30.0–36.0)
MCV: 95.2 fL (ref 80.0–100.0)
Platelets: 363 10*3/uL (ref 150–400)
RBC: 3.31 MIL/uL — ABNORMAL LOW (ref 4.22–5.81)
RDW: 16.2 % — ABNORMAL HIGH (ref 11.5–15.5)
WBC: 15.6 10*3/uL — ABNORMAL HIGH (ref 4.0–10.5)
nRBC: 0 % (ref 0.0–0.2)

## 2020-08-19 LAB — GLUCOSE, CAPILLARY
Glucose-Capillary: 171 mg/dL — ABNORMAL HIGH (ref 70–99)
Glucose-Capillary: 212 mg/dL — ABNORMAL HIGH (ref 70–99)
Glucose-Capillary: 210 mg/dL — ABNORMAL HIGH (ref 70–99)

## 2020-08-19 MED ORDER — MIRTAZAPINE 15 MG PO TABS: 15.0000 mg | ORAL_TABLET | Freq: Every day | ORAL | 0 refills | Status: DC

## 2020-08-19 MED ORDER — CHLORHEXIDINE GLUCONATE CLOTH 2 % EX PADS: 6.0000 | MEDICATED_PAD | Freq: Every day | CUTANEOUS | Status: DC

## 2020-08-19 MED ORDER — OXYCODONE HCL 5 MG PO TABS: 5.0000 mg | ORAL_TABLET | Freq: Three times a day (TID) | ORAL | 0 refills | Status: AC | PRN

## 2020-08-19 MED ORDER — PANTOPRAZOLE SODIUM 40 MG PO TBEC: 40.0000 mg | DELAYED_RELEASE_TABLET | Freq: Every day | ORAL | 2 refills | Status: AC

## 2020-08-19 MED ORDER — MIRTAZAPINE 15 MG PO TABS
15.0000 mg | ORAL_TABLET | Freq: Every day | ORAL | Status: DC
  Administered 2020-08-19: 15 mg via ORAL
  Filled 2020-08-19: qty 1

## 2020-08-19 MED ORDER — DARBEPOETIN ALFA 40 MCG/0.4ML IJ SOSY: 40.0000 ug | PREFILLED_SYRINGE | INTRAMUSCULAR | 0 refills | Status: AC

## 2020-08-19 NOTE — TOC Transition Note (Signed)
Transition of Care [facility]) - CM/SW Discharge Note   Patient Details  Name: Dennis Macias MRN: [id number] Date of Birth: 1951-03-31  Transition of Care [facility]) CM/SW Contact:  Trula Ore, [place] Phone Number: 08/19/2020, 11:47 AM   Clinical Narrative:     Patient will DC to: [place]  Anticipated DC date: 08/19/2020  Family notified: [name]  Transport by: Corey Harold  ?  Per MD patient ready for DC to [facility] . RN, patient, patient's family,Renal Navigator, and facility notified of DC. Discharge Summary sent to facility. RN given number for report tele#[phone] RM#[phone]. DC packet on chart. Ambulance transport requested for patient.  CSW signing off.    Final next level of care: Skilled Nursing Facility Barriers to Discharge: Continued Medical Work up   Patient Goals and CMS Choice Patient states their goals for this hospitalization and ongoing recovery are:: to go to SNF CMS Medicare.gov Compare Post Acute Care list provided to:: Patient Choice offered to / list presented to : [place] (significant other [facility])  Discharge Placement              Patient chooses bed at: [facility] Patient to be transferred to facility by: [place] Name of family member notified: [name] Patient and family notified of of transfer: 08/19/20  Discharge Plan and Services                          HH Arranged: PT,OT [place]: [place] Date [place]: 08/03/20   Representative spoke with at [place]: [place] ([place]) Interventions     Readmission Risk Interventions Readmission Risk Prevention Plan 01/19/2020 11/10/2019 08/10/2019  Transportation Screening Complete Complete Complete  PCP or Specialist Appt within 3-5 Days - - Complete  HRI or [place] - - Complete  Social Work Consult for [place] Planning/Counseling - - Complete  Palliative Care Screening - - Complete  Medication Review Designer, fashion/clothing) Complete Complete Complete  PCP or Specialist appointment within 3-5 days of discharge Complete - -  [place] or Home Care Consult Complete Complete -  SW Recovery Care/Counseling Consult Complete Complete -  Palliative Care Screening Complete Not Applicable -  [place] Complete Complete -  Some recent data might be hidden

## 2020-08-19 NOTE — Progress Notes (Signed)
[facility] Progress Note   Subjective:   Seen on dialysis. Treatment was postponed until today due to high census. Patient reports feeling well, no complaints. Denies SOB, CP, abdominal pain and nausea.   Objective Vitals:   08/18/20 2300 08/18/20 2355 08/19/20 0000 08/19/20 0300  BP:  (!) 95/56  90/62  Pulse:  71  78  Resp: 17 16 15 19   Temp:  98.2 F (36.8 C)  98.4 F (36.9 C)  TempSrc:  Oral  Oral  SpO2:  97%  96%  Weight:    60.1 kg  Height:       Physical Exam General: Well developed, alert male in NAD Heart: RRR, no murmurs, rubs or gallops Lungs: CTA bilaterally, respirations unlabored on RA Abdomen: Soft, non-tender, non-distended ,+BS Extremities: No edema b/l lower extremities. L BKA, R TMA Dialysis Access: RUE AVF accessed  Additional Objective Labs: Basic Metabolic Panel: Recent Labs  Lab 08/17/20 0200 08/18/20 0754 08/19/20 0050  NA 138 139 137  K 3.9 3.8 4.1  CL 96* 95* 94*  CO2 24 25 27   GLUCOSE 151* 156* 194*  BUN 29* 49* 27*  CREATININE 5.18* 6.99* 4.70*  CALCIUM 8.4* 8.8* 8.4*   CBC: Recent Labs  Lab 08/16/20 0633 08/16/20 1129 08/17/20 0200 08/18/20 0754 08/19/20 0050  WBC 15.7* 14.4* 15.4* 14.1* 15.6*  HGB 8.0* 8.7* 9.2* 8.5* 10.1*  HCT 25.9* 26.9* 28.7* 28.1* 31.5*  MCV 94.5 93.4 94.7 96.6 95.2  PLT 448* 447* 413* 398 363   Blood Culture    Component Value Date/Time   SDES BLOOD LEFT HAND 08/04/2020 1300   SDES BLOOD LEFT HAND 08/04/2020 1300   SPECREQUEST  08/04/2020 1300    BOTTLES DRAWN AEROBIC AND ANAEROBIC Blood Culture adequate volume   SPECREQUEST  08/04/2020 1300    BOTTLES DRAWN AEROBIC AND ANAEROBIC Blood Culture adequate volume   CULT  08/04/2020 1300    NO GROWTH 5 DAYS Performed at [facility], [place] [address] [place], [place] [address]    CULT  08/04/2020 1300    NO GROWTH 5 DAYS Performed at [facility], [place] [address], [place],  [address]    REPTSTATUS 08/09/2020  FINAL 08/04/2020 1300   REPTSTATUS 08/09/2020 FINAL 08/04/2020 1300    CBG: Recent Labs  Lab 08/17/20 1630 08/17/20 2146 08/18/20 0741 08/18/20 1642 08/18/20 2146  GLUCAP 218* 185* 158* 141* 173*   Medications: . sodium chloride 250 mL (08/06/20 0342)   . aspirin EC  81 mg Oral Daily  . atorvastatin  80 mg Oral Daily  . Chlorhexidine Gluconate Cloth  6 each Topical Q0600  . clopidogrel  75 mg Oral Daily  . darbepoetin (ARANESP) injection - DIALYSIS  40 mcg Intravenous Q Sat-HD  . docusate sodium  100 mg Oral Daily  . dorzolamide-timolol  1 drop Right Eye BID  . finasteride  5 mg Oral Daily  . heparin injection (subcutaneous)  5,000 Units Subcutaneous Q8H  . insulin aspart  0-6 Units Subcutaneous TID WC  . insulin glargine  7 Units Subcutaneous QHS  . pantoprazole  40 mg Oral Daily  . polyethylene glycol  17 g Oral Daily  . sodium chloride flush  3 mL Intravenous Q12H    Dialysis Orders: TTS -[facility] 4hrs, BFR400, [id number], EDW 69kg,2K/2.25Ca Access:RU AVF Heparin4000 unit bolus Hectorol81mcg IV qHD  Assessment/Plan: 1. Sepsis 2/2 MSSA bacteremia. ID consulted,TEE negative for endocarditis. Suspected source gangrenous toess/p TMA 08/11/20.BC from 1/27 negative. Completed course  ofCefazolin on 08/18/20.   2. Syncopal episode -likely multifactorial - acute CVA, hypotension, and acute on chronic anemia. Not volume overloaded.No further syncope.Minimal UF with HD. 3. Acute CVA- seen on MRI. Thought to be related to cerebral hypoperfusion with hypotension post HD, as this was the cause of a past stroke. Per neuro/Admit 4. R 1st & 2nd digit ulcerations/Hx PVD: S/p RLE balloon angioplasty to PTA 05/29/20.VVS consulted, wentfor arteriogram01/28/22, followed by TMA on 08/11/20. High risk for BKA per Dr. Hartley Barefoot to operative findings. 5. ESRD:Off schedule today, continue TTS schedule tomorrow.HD treatments shortened due to high patient census/staffing  shortage. Not uremic or volume overloaded onexam. 6. Hypertension/volume: BPcontrolled.Belowdry wt with no volume overload on exam.Losing some weight withsepsis,NPO status/nausea. Lower dry on d/c.UF as tolerated, keeping SBP>100. 7. Acute on chronic Anemiaof CKD: + FOBT earlier this admission,s/p 1 unit pRBC1/20and another unit on 1/24.GI consulted,underwent EGD1/25which showed esop hagitis.Hgb decreased to 8.0 on 2/8, now back up to 10.Continue ESA- aranesp 54mcg weekly. 8. Secondary Hyperparathyroidism:Corrected calcium10.5, has been running high. Hectorol on hold. Phos has been at goal off binders, will recheck with labs tomorrow AM.  9. DMT2: per admitting team   Dennis Paganini, PA-C 08/19/2020, 8:14 AM  [facility] Pager: [phone]

## 2020-08-20 ENCOUNTER — Emergency Department (HOSPITAL_COMMUNITY)
Admission: EM | Admit: 2020-08-20 | Discharge: 2020-08-21 | Disposition: A | Discharge status: Home / Self Care | Payer: Medicare PPO | Attending: Emergency Medicine | Admitting: Emergency Medicine

## 2020-08-20 ENCOUNTER — Other Ambulatory Visit: Payer: Self-pay

## 2020-08-20 ENCOUNTER — Emergency Department (HOSPITAL_COMMUNITY): Payer: Medicare PPO

## 2020-08-20 ENCOUNTER — Encounter (HOSPITAL_COMMUNITY): Payer: Self-pay | Admitting: Emergency Medicine

## 2020-08-20 DIAGNOSIS — Z8673 Personal history of transient ischemic attack (TIA), and cerebral infarction without residual deficits: Secondary | ICD-10-CM | POA: Insufficient documentation

## 2020-08-20 DIAGNOSIS — E1122 Type 2 diabetes mellitus with diabetic chronic kidney disease: Secondary | ICD-10-CM | POA: Insufficient documentation

## 2020-08-20 DIAGNOSIS — M5021 Other cervical disc displacement,  high cervical region: Secondary | ICD-10-CM | POA: Diagnosis not present

## 2020-08-20 DIAGNOSIS — I499 Cardiac arrhythmia, unspecified: Secondary | ICD-10-CM | POA: Diagnosis not present

## 2020-08-20 DIAGNOSIS — G9389 Other specified disorders of brain: Secondary | ICD-10-CM | POA: Diagnosis not present

## 2020-08-20 DIAGNOSIS — Y9289 Other specified places as the place of occurrence of the external cause: Secondary | ICD-10-CM | POA: Insufficient documentation

## 2020-08-20 DIAGNOSIS — I5042 Chronic combined systolic (congestive) and diastolic (congestive) heart failure: Secondary | ICD-10-CM | POA: Diagnosis not present

## 2020-08-20 DIAGNOSIS — R001 Bradycardia, unspecified: Secondary | ICD-10-CM | POA: Diagnosis not present

## 2020-08-20 DIAGNOSIS — W19XXXA Unspecified fall, initial encounter: Secondary | ICD-10-CM

## 2020-08-20 DIAGNOSIS — I517 Cardiomegaly: Secondary | ICD-10-CM | POA: Diagnosis not present

## 2020-08-20 DIAGNOSIS — Z87891 Personal history of nicotine dependence: Secondary | ICD-10-CM | POA: Insufficient documentation

## 2020-08-20 DIAGNOSIS — I10 Essential (primary) hypertension: Secondary | ICD-10-CM | POA: Diagnosis not present

## 2020-08-20 DIAGNOSIS — Z89512 Acquired absence of left leg below knee: Secondary | ICD-10-CM | POA: Insufficient documentation

## 2020-08-20 DIAGNOSIS — N186 End stage renal disease: Secondary | ICD-10-CM | POA: Diagnosis not present

## 2020-08-20 DIAGNOSIS — E11319 Type 2 diabetes mellitus with unspecified diabetic retinopathy without macular edema: Secondary | ICD-10-CM | POA: Diagnosis not present

## 2020-08-20 DIAGNOSIS — R55 Syncope and collapse: Secondary | ICD-10-CM | POA: Diagnosis not present

## 2020-08-20 DIAGNOSIS — Z992 Dependence on renal dialysis: Secondary | ICD-10-CM | POA: Diagnosis not present

## 2020-08-20 DIAGNOSIS — W050XXA Fall from non-moving wheelchair, initial encounter: Secondary | ICD-10-CM | POA: Insufficient documentation

## 2020-08-20 DIAGNOSIS — I132 Hypertensive heart and chronic kidney disease with heart failure and with stage 5 chronic kidney disease, or end stage renal disease: Secondary | ICD-10-CM | POA: Insufficient documentation

## 2020-08-20 DIAGNOSIS — Z79899 Other long term (current) drug therapy: Secondary | ICD-10-CM | POA: Insufficient documentation

## 2020-08-20 DIAGNOSIS — N2581 Secondary hyperparathyroidism of renal origin: Secondary | ICD-10-CM | POA: Diagnosis not present

## 2020-08-20 DIAGNOSIS — S0990XA Unspecified injury of head, initial encounter: Secondary | ICD-10-CM | POA: Diagnosis not present

## 2020-08-20 DIAGNOSIS — S199XXA Unspecified injury of neck, initial encounter: Secondary | ICD-10-CM | POA: Diagnosis not present

## 2020-08-20 DIAGNOSIS — Z7982 Long term (current) use of aspirin: Secondary | ICD-10-CM | POA: Diagnosis not present

## 2020-08-20 DIAGNOSIS — G319 Degenerative disease of nervous system, unspecified: Secondary | ICD-10-CM | POA: Diagnosis not present

## 2020-08-20 DIAGNOSIS — M50223 Other cervical disc displacement at C6-C7 level: Secondary | ICD-10-CM | POA: Diagnosis not present

## 2020-08-20 LAB — BASIC METABOLIC PANEL
Anion gap: 17 — ABNORMAL HIGH (ref 5–15)
BUN: 21 mg/dL (ref 8–23)
CO2: 28 mmol/L (ref 22–32)
Calcium: 8.8 mg/dL — ABNORMAL LOW (ref 8.9–10.3)
Chloride: 94 mmol/L — ABNORMAL LOW (ref 98–111)
Creatinine, Ser: 3.65 mg/dL — ABNORMAL HIGH (ref 0.61–1.24)
GFR, Estimated: 17 mL/min — ABNORMAL LOW (ref >60)
Glucose, Bld: 196 mg/dL — ABNORMAL HIGH (ref 70–99)
Potassium: 3.7 mmol/L (ref 3.5–5.1)
Sodium: 139 mmol/L (ref 135–145)

## 2020-08-20 LAB — CBC
HCT: 32.7 % — ABNORMAL LOW (ref 39.0–52.0)
Hemoglobin: 10 g/dL — ABNORMAL LOW (ref 13.0–17.0)
MCH: 30 pg (ref 26.0–34.0)
MCHC: 30.6 g/dL (ref 30.0–36.0)
MCV: 98.2 fL (ref 80.0–100.0)
Platelets: 327 10*3/uL (ref 150–400)
RBC: 3.33 MIL/uL — ABNORMAL LOW (ref 4.22–5.81)
RDW: 16 % — ABNORMAL HIGH (ref 11.5–15.5)
WBC: 14.5 10*3/uL — ABNORMAL HIGH (ref 4.0–10.5)
nRBC: 0 % (ref 0.0–0.2)

## 2020-08-20 LAB — CBG MONITORING, ED: Glucose-Capillary: 173 mg/dL — ABNORMAL HIGH (ref 70–99)

## 2020-08-20 MED ORDER — ONDANSETRON 4 MG PO TBDP: 4.0000 mg | ORAL_TABLET | Freq: Once | ORAL | Status: DC

## 2020-08-20 NOTE — ED Notes (Signed)
Updated pt's wife with pt's permission

## 2020-08-20 NOTE — ED Notes (Signed)
Called [facility] to get update on patient status. He is the next scheduled patient for transport.

## 2020-08-20 NOTE — ED Provider Notes (Signed)
[facility] EMERGENCY DEPARTMENT Provider Note   CSN: [id number] Arrival date & time: 08/20/20  8101     History Chief Complaint  Patient presents with  . Fall    Dennis Macias is a 70 y.o. male.  HPI   Patient has history multiple medical problems including chronic kidney disease and recurrent syncope.  Patient went to dialysis today.  He was sitting in a wheelchair waiting for his ride when patient fell out of the chair.  Patient states he started to feel sick and nauseated.  He was not having any trouble with headache.  He was not having trouble with abdominal pain.  He has not had issues with vomiting or diarrhea.  No fevers or chills.  Patient fell out of the wheelchair and staff reported a syncopal episode.  Patient did hit the back of his head.  Since then the patient states he has been having persistent headache.  He does feel like he might pass out.  Past Medical History:  Diagnosis Date  . Anemia   . Arthritis    past hx   . Blindness    right eye r/t diabetes per wife [name]  . Cardiorenal syndrome   . Cataract    removed both eyes  . CHF (congestive heart failure) (HCC)    hx  . CKD (chronic kidney disease)    Dialysis T Th Sat  . Dehydration   . Diabetes ([place])    type 2 - diet controlled, no meds  . Glaucoma   . History of CVA (cerebrovascular accident) 09/13/2015  . History of stroke 09/13/2015  . History of urinary retention   . HOH (hard of hearing)    no hearing aids  . Hyperlipidemia   . Hypertension   . NSTEMI (non-ST elevated myocardial infarction) ([place])   . Peripheral vascular disease ([place])   . Pernicious anemia 02/24/2018  . S/P TURP   . Stroke [facility])    2017- March, no deficit   . Syncope 11/2019  . Tachycardia 08/26/2017  . Tubular adenoma of colon 02/2017  . Walker as ambulation aid    and occasional uses cane  . Weight loss, non-intentional 08/26/2017   10 lbs between 6/18 & 2/19    Patient Active Problem List   Diagnosis  Date Noted  . Fever   . Nonerosive esophageal reflux disease 08/02/2020  . Hiatal hernia with GERD and esophagitis 08/02/2020  . Gastroesophageal reflux disease with esophagitis   . Dark stools   . Stroke ([place]) 07/29/2020  . Syncope 07/28/2020  . Acute cystitis 05/03/2020  . S/P BKA (below knee amputation) ([place]) 03/31/2020  . PAD (peripheral artery disease) ([place]) 02/18/2020  . Orthostatic hypotension 02/18/2020  . Acute cerebrovascular accident (CVA) due to ischemia ([place]) 02/17/2020  . ESRD (end stage renal disease) ([place])   . Goals of care, counseling/discussion   . Acute on chronic combined systolic and diastolic CHF (congestive heart failure) ([place]) 12/24/2019  . Non-Obstructive CAD 12/14/2019  . BPH (benign prostatic hyperplasia) 12/08/2019  . Anemia of chronic disease   . Protein-calorie malnutrition, severe 11/19/2019  . MSSA bacteremia   . Gait disorder   . Chronic combined systolic and diastolic heart failure ([place])   . Left renal mass 07/21/2019  . CKD (chronic kidney disease) stage 4, GFR 15-29 ml/min (HCC) 04/23/2019  . Weight loss, non-intentional 08/26/2017  . 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 Anemia 12/21/2016  . Hyperlipidemia 09/13/2015  . 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 Essential hypertension     Past Surgical History:  Procedure Laterality Date  . ABDOMINAL AORTOGRAM W/LOWER EXTREMITY N/A 02/09/2020   Procedure: ABDOMINAL AORTOGRAM W/LOWER EXTREMITY;  Surgeon: Serafina Mitchell, MD;  Location: [facility] CV LAB;  Service: Cardiovascular;  Laterality: N/A;  . ABDOMINAL AORTOGRAM W/LOWER EXTREMITY N/A 05/10/2020   Procedure: ABDOMINAL AORTOGRAM W/LOWER EXTREMITY;  Surgeon: Serafina Mitchell, MD;  Location: [place] CV [facility];  Service: Cardiovascular;  Laterality: N/A;  . ABDOMINAL AORTOGRAM W/LOWER EXTREMITY N/A 08/05/2020   Procedure: ABDOMINAL AORTOGRAM W/LOWER EXTREMITY;  Surgeon: Angelia Mould, MD;  Location: [place] [facility];   Service: Cardiovascular;  Laterality: N/A;  . AMPUTATION Left 03/31/2020   Procedure: LEFT BELOW KNEE AMPUTATION;  Surgeon: Serafina Mitchell, MD;  Location: [facility] OR;  Service: Vascular;  Laterality: Left;  MAC anesthesia with regional block   WILL NEED DIALYSIS THURSDAY  . AV FISTULA PLACEMENT Right 01/19/2020   Procedure: BRACHIOCEPHALIC ARTERIOVENOUS (AV) FISTULA CREATION;  Surgeon: Waynetta Sandy, MD;  Location: [facility];  Service: Vascular;  Laterality: Right;  . BUBBLE STUDY  08/08/2020   Procedure: BUBBLE STUDY;  Surgeon: Elouise Munroe, MD;  Location: [facility];  Service: Cardiology;;  . CATARACT EXTRACTION, BILATERAL    . COLONOSCOPY    . ESOPHAGOGASTRODUODENOSCOPY (EGD) WITH PROPOFOL N/A 08/02/2020   Procedure: ESOPHAGOGASTRODUODENOSCOPY (EGD) WITH PROPOFOL;  Surgeon: Irene Shipper, MD;  Location[facility] END[facility];  Service: Endoscopy;  Laterality: N/A;  . IR FLUORO GUIDE CV LINE RIGHT  01/14/2020  . IR THORACENTESIS ASP PLEURAL SPACE W/IMG GUIDE  09/21/2019  . IR THORACENTESIS ASP PLEURAL SPACE W/IMG GUIDE  10/16/2019  . IR US GUIDE VASC ACCESS RIGHT  01/15/2020  . LEFT HEART CATH AND CORONARY ANGIOGRAPHY N/A 11/19/2019   Procedure: LEFT HEART CATH AND CORONARY ANGIOGRAPHY;  Surgeon: Jettie Booze, MD;  Location: [place] CV LAB;  Service: Cardiovascular;  Laterality: N/A;  . PERIPHERAL VASCULAR BALLOON ANGIOPLASTY Right 05/10/2020   Procedure: PERIPHERAL VASCULAR BALLOON ANGIOPLASTY;  Surgeon: Serafina Mitchell, MD;  Location: [facility] CV LAB;  Service: Cardiovascular;  Laterality: Right;  PT  . PERIPHERAL VASCULAR INTERVENTION Left 02/09/2020   Procedure: PERIPHERAL VASCULAR INTERVENTION;  Surgeon: Serafina Mitchell, MD;  Location: [place] [facility];  Service: Cardiovascular;  Laterality: Left;  . PERIPHERAL VASCULAR INTERVENTION Right 08/05/2020   Procedure: PERIPHERAL VASCULAR INTERVENTION;  Surgeon: Angelia Mould, MD;  Location: [facility] CV LAB;  Service:  Cardiovascular;  Laterality: Right;  . POLYPECTOMY    . REFRACTIVE SURGERY  10/2017  . TEE WITHOUT CARDIOVERSION N/A 09/14/2015   Procedure: TRANSESOPHAGEAL ECHOCARDIOGRAM (TEE);  Surgeon: Larey Dresser, MD;  Location: [facility];  Service: Cardiovascular;  Laterality: N/A;  . TEE WITHOUT CARDIOVERSION N/A 08/08/2020   Procedure: TRANSESOPHAGEAL ECHOCARDIOGRAM (TEE);  Surgeon: Elouise Munroe, MD;  Location: [place];  Service: Cardiology;  Laterality: N/A;  . TRANSMETATARSAL AMPUTATION Right 08/11/2020   Procedure: RIGHT TRANSMETATARSAL AMPUTATION;  Surgeon: Waynetta Sandy, MD;  Location: [facility];  Service: Vascular;  Laterality: Right;  . TRANSURETHRAL RESECTION OF PROSTATE N/A 10/20/2019   Procedure: TRANSURETHRAL RESECTION OF THE PROSTATE (TURP);  Surgeon: Irine Seal, MD;  Location: [facility];  Service: Urology;  Laterality: N/A;  . UPPER GASTROINTESTINAL ENDOSCOPY         Family History  Problem Relation Age of Onset  . Hypertension Mother   . Hyperlipidemia Mother   . Hyperlipidemia Father   .  Colon cancer Neg Hx   . Colon polyps Neg Hx   . Esophageal cancer Neg Hx   . Rectal cancer Neg Hx   . Stomach cancer Neg Hx     Social History   Tobacco Use  . Smoking status: Former Smoker    Types: Cigarettes  . Smokeless tobacco: Former Systems developer    Types: Chew    Quit date: 07/09/1978  . Tobacco comment: quit yrs ago  Vaping Use  . Vaping Use: Never used  Substance Use Topics  . Alcohol use: Not Currently    Alcohol/week: 1.0 standard drink    Types: 1 Cans of beer per week    Comment: quit last march/2017  . Drug use: No    Home Medications Prior to Admission medications   Medication Sig Start Date End Date Taking? Authorizing Provider  Accu-Chek FastClix Lancets MISC Check blood sugar up to 7 times a week as instructed 12/03/19   Angiulli, Lavon Paganini, PA-C  acetaminophen (TYLENOL) 500 MG tablet Take 500 mg by mouth every 6 (six) hours as needed for moderate pain  or headache.    [provider]  aspirin EC 81 MG tablet Take 1 tablet (81 mg total) by mouth daily. 10/22/19   Eugenie Filler, MD  atorvastatin (LIPITOR) 80 MG tablet Take 1 tablet (80 mg total) by mouth at bedtime. IM program 02/29/20   Madalyn Rob, MD  Blood Glucose Monitoring Suppl (ACCU-CHEK GUIDE) w/Device KIT 1 each by Does not apply route daily. Check blood sugar as instructed up to 7 times a week 12/03/19   Angiulli, Lavon Paganini, PA-C  clopidogrel (PLAVIX) 75 MG tablet Take 1 tablet (75 mg total) by mouth daily. 05/10/20   Serafina Mitchell, MD  Darbepoetin Alfa (ARANESP) 40 MCG/0.4ML SOSY injection Inject 0.4 mLs (40 mcg total) into the vein every Saturday with hemodialysis. 08/20/20   Iona Beard, MD  dorzolamide-timolol (COSOPT) 22.3-6.8 MG/ML ophthalmic solution Place 1 drop into the right eye 2 (two) times daily. 12/03/19   Angiulli, Lavon Paganini, PA-C  finasteride (PROSCAR) 5 MG tablet Take 1 tablet (5 mg total) by mouth daily. 02/29/20   Madalyn Rob, MD  glucose blood (ACCU-CHEK GUIDE) test strip Check blood sugar up to 7 times a week as instructed 12/03/19   Angiulli, Lavon Paganini, PA-C  lidocaine-prilocaine (EMLA) cream Apply 1 application topically daily as needed (prior to fistula use).    [provider]  mirtazapine (REMERON) 15 MG tablet Take 1 tablet (15 mg total) by mouth at bedtime. 08/19/20   Iona Beard, MD  oxyCODONE (ROXICODONE) 5 MG immediate release tablet Take 1 tablet (5 mg total) by mouth every 8 (eight) hours as needed for up to 5 days for moderate pain. 08/19/20 08/24/20  Iona Beard, MD  pantoprazole (PROTONIX) 40 MG tablet Take 1 tablet (40 mg total) by mouth daily. 08/19/20 11/17/20  Iona Beard, MD  polyethylene glycol (MIRALAX / GLYCOLAX) 17 g packet Take 17 g by mouth daily as needed for moderate constipation.     [provider]  sevelamer carbonate (RENVELA) 800 MG tablet Take 800 mg by mouth See admin instructions. Take 800 mg by mouth  three times a day with food on Sun/Mon/Wed/Fri and two times a day on Tues/Thurs/Sat    [provider]  vitamin B-12 (CYANOCOBALAMIN) 1000 MCG tablet Take 1 tablet (1,000 mcg total) by mouth daily. 12/03/19   Angiulli, Lavon Paganini, PA-C    Allergies    Cefepime  Review  of Systems   Review of Systems  All other systems reviewed and are negative.   Physical Exam Updated Vital Signs BP (!) 135/123   Pulse 70   Temp 98.1 F (36.7 C) (Oral)   Resp 18   SpO2 100%   Physical Exam Vitals and nursing note reviewed.  Constitutional:      Appearance: He is well-developed and well-nourished. He is ill-appearing.  HENT:     Head: Normocephalic.     Comments: Tenderness palpation posterior occiput, no laceration    Right Ear: External ear normal.     Left Ear: External ear normal.  Eyes:     General: No scleral icterus.       Right eye: No discharge.        Left eye: No discharge.     Conjunctiva/sclera: Conjunctivae normal.  Neck:     Trachea: No tracheal deviation.  Cardiovascular:     Rate and Rhythm: Normal rate and regular rhythm.     Pulses: Intact distal pulses.  Pulmonary:     Effort: Pulmonary effort is normal. No respiratory distress.     Breath sounds: Normal breath sounds. No stridor. No wheezing or rales.  Abdominal:     General: Bowel sounds are normal. There is no distension.     Palpations: Abdomen is soft.     Tenderness: There is no abdominal tenderness. There is no guarding or rebound.  Musculoskeletal:        General: No tenderness or edema.     Cervical back: Neck supple.     Comments: No extremity tenderness, no cervical thoracic or lumbar spine tenderness, status post amputation of extremities  Skin:    General: Skin is warm and dry.     Findings: No rash.  Neurological:     General: No focal deficit present.     Mental Status: He is alert.     Cranial Nerves: No cranial nerve deficit (no facial droop , no slurred speech).     Sensory: No  sensory deficit.     Motor: Weakness ( generalized) present. No abnormal muscle tone or seizure activity.     Coordination: Coordination normal.     Deep Tendon Reflexes: Strength normal.  Psychiatric:        Mood and Affect: Mood and affect normal.     ED Results / Procedures / Treatments   Labs (all labs ordered are listed, but only abnormal results are displayed) Labs Reviewed  BASIC METABOLIC PANEL - Abnormal; Notable for the following components:      Result Value   Chloride 94 (*)    Glucose, Bld 196 (*)    Creatinine, Ser 3.65 (*)    Calcium 8.8 (*)    GFR, Estimated 17 (*)    Anion gap 17 (*)    All other components within normal limits  CBC - Abnormal; Notable for the following components:   WBC 14.5 (*)    RBC 3.33 (*)    Hemoglobin 10.0 (*)    HCT 32.7 (*)    RDW 16.0 (*)    All other components within normal limits  CBG MONITORING, ED - Abnormal; Notable for the following components:   Glucose-Capillary 173 (*)    All other components within normal limits    EKG EKG Interpretation  Date/Time:  Saturday August 20 2020 17:12:21 EST Ventricular Rate:  89 PR Interval:    QRS Duration: 159 QT Interval:  473 QTC Calculation: 576 R Axis:   -  90 Text Interpretation: sinus rhythm RBBB and LAFB LVH by voltage Inferolateral infarct, acute Anterior Q waves, possibly due to LVH Confirmed by Dorie Rank [phone]) on 08/20/2020 5:14:45 PM   Radiology CT Head Wo Contrast  Result Date: 08/20/2020 CLINICAL DATA:  Head trauma. EXAM: CT HEAD WITHOUT CONTRAST CT CERVICAL SPINE WITHOUT CONTRAST TECHNIQUE: Multidetector CT imaging of the head and cervical spine was performed following the standard protocol without intravenous contrast. Multiplanar CT image reconstructions of the cervical spine were also generated. COMPARISON:  11/11/2019, head CT.  Cervical CT, 07/31/2019 FINDINGS: CT HEAD FINDINGS Brain: No evidence of acute infarction, hemorrhage, hydrocephalus, extra-axial  collection or mass lesion/mass effect. There is ventricular sulcal enlargement reflecting mild to moderate diffuse atrophy. Patchy white matter hypoattenuation is noted bilaterally consistent with moderate to advanced chronic microvascular ischemic change. Vascular: No hyperdense vessel or unexpected calcification. Skull: Normal. Negative for fracture or focal lesion. Sinuses/Orbits: Globes and orbits are unremarkable. Sinuses are clear. Other: None. CT CERVICAL SPINE FINDINGS Alignment: Normal. Skull base and vertebrae: No acute fracture. No primary bone lesion or focal pathologic process. Soft tissues and spinal canal: No prevertebral fluid or swelling. No visible canal hematoma. Disc levels: Mild loss of disc height at C2-C3. Mild to moderate loss of disc height at C3-C4 with diffuse disc bulging indenting the ventral thecal sac with uncovertebral spurring causing severe right and moderate left neural foraminal narrowing. No convincing disc herniations. Mild to moderate loss of disc height at C5-C6 with moderate loss of disc height at C6-C7. Diffuse disc bulging at C6-C7 with at least moderate left neural foraminal narrowing. Upper chest: No acute findings.  Lung apices are clear. Other: None. IMPRESSION: HEAD CT 1. No acute intracranial abnormalities. 2. Atrophy and significant chronic microvascular ischemic change. CERVICAL CT 1. No fracture or acute finding. 2. Significant degenerative change as described without change compared to the recent prior cervical CT. Electronically Signed   By: Lajean Manes M.D.   On: 08/20/2020 17:52   CT Cervical Spine Wo Contrast  Result Date: 08/20/2020 CLINICAL DATA:  Head trauma. EXAM: CT HEAD WITHOUT CONTRAST CT CERVICAL SPINE WITHOUT CONTRAST TECHNIQUE: Multidetector CT imaging of the head and cervical spine was performed following the standard protocol without intravenous contrast. Multiplanar CT image reconstructions of the cervical spine were also generated.  COMPARISON:  11/11/2019, head CT.  Cervical CT, 07/31/2019 FINDINGS: CT HEAD FINDINGS Brain: No evidence of acute infarction, hemorrhage, hydrocephalus, extra-axial collection or mass lesion/mass effect. There is ventricular sulcal enlargement reflecting mild to moderate diffuse atrophy. Patchy white matter hypoattenuation is noted bilaterally consistent with moderate to advanced chronic microvascular ischemic change. Vascular: No hyperdense vessel or unexpected calcification. Skull: Normal. Negative for fracture or focal lesion. Sinuses/Orbits: Globes and orbits are unremarkable. Sinuses are clear. Other: None. CT CERVICAL SPINE FINDINGS Alignment: Normal. Skull base and vertebrae: No acute fracture. No primary bone lesion or focal pathologic process. Soft tissues and spinal canal: No prevertebral fluid or swelling. No visible canal hematoma. Disc levels: Mild loss of disc height at C2-C3. Mild to moderate loss of disc height at C3-C4 with diffuse disc bulging indenting the ventral thecal sac with uncovertebral spurring causing severe right and moderate left neural foraminal narrowing. No convincing disc herniations. Mild to moderate loss of disc height at C5-C6 with moderate loss of disc height at C6-C7. Diffuse disc bulging at C6-C7 with at least moderate left neural foraminal narrowing. Upper chest: No acute findings.  Lung apices are clear. Other: None. IMPRESSION: HEAD CT  1. No acute intracranial abnormalities. 2. Atrophy and significant chronic microvascular ischemic change. CERVICAL CT 1. No fracture or acute finding. 2. Significant degenerative change as described without change compared to the recent prior cervical CT. Electronically Signed   By: Lajean Manes M.D.   On: 08/20/2020 17:52    Procedures Procedures   Medications Ordered in ED Medications  ondansetron (ZOFRAN-ODT) disintegrating tablet 4 mg (0 mg Oral Hold 08/20/20 1758)    ED Course  I have reviewed the triage vital signs and the  nursing notes.  Pertinent labs & imaging results that were available during my care of the patient were reviewed by me and considered in my medical decision making (see chart for details).  Clinical Course as of 08/20/20 1931  Sat Aug 20, 2020  1916 Patient's hemoglobin is stable.  White blood cell count elevated but similar to previous [JK]  2671 Metabolic panel is consistent with his chronic kidney disease but no acute electrolyte abnormalities. [JK]  1917 Head CT and C-spine CT without acute findings [JK]    Clinical Course User Index [JK] Dorie Rank, MD   MDM Rules/Calculators/A&P                         Patient presented to the ED for evaluation of a fall after receiving dialysis today.  Patient does have history of multiple medical problems including chronic kidney disease and prior strokes.  Patient states he felt weak prior to the fall.  He may have had a syncopal episode.  Patient does have history of this in the past.  Currently the patient is alert and awake and at his baseline.  No focal findings to suggest acute stroke at this time.  No evidence of anemia.  No cardiac dysrhythmia noted.  Patient is not hypotensive.  Suspect he might of had an episode of orthostatic hypotension associated with his recent dialysis.  He has remained stable while being monitored here in the emergency room.  Head CT and C-spine CT did not show any serious injuries.  Patient is stable for discharge  Final Clinical Impression(s) / ED Diagnoses Final diagnoses:  Fall, initial encounter    Rx / DC Orders ED Discharge Orders    None       Dorie Rank, MD 08/20/20 [phone]

## 2020-08-20 NOTE — ED Notes (Signed)
[facility] called  

## 2020-08-20 NOTE — ED Triage Notes (Addendum)
Pt to triage via [facility] from dialysis.  Pt was sitting in wheelchair in the waiting area waiting for his ride and fell out of the chair.  Staff reports pt had syncopal episode and hit the back of his head.  Pt reports pain to back of his head and states he feels like he is going to pass out again.  Pt has blood on bandage to R foot/stump.    Pt seen in ED on 1/20 for falling out of wheelchair while waiting on his ride at dialysis.

## 2020-08-20 NOTE — Discharge Instructions (Addendum)
Take over-the-counter Tylenol as needed for aches and pains.  Return to the ED as needed for worsening symptoms.

## 2020-08-21 DIAGNOSIS — M6281 Muscle weakness (generalized): Secondary | ICD-10-CM | POA: Diagnosis not present

## 2020-08-21 DIAGNOSIS — S98131D Complete traumatic amputation of one right lesser toe, subsequent encounter: Secondary | ICD-10-CM | POA: Diagnosis not present

## 2020-08-21 DIAGNOSIS — Z7902 Long term (current) use of antithrombotics/antiplatelets: Secondary | ICD-10-CM | POA: Diagnosis not present

## 2020-08-21 DIAGNOSIS — Z89519 Acquired absence of unspecified leg below knee: Secondary | ICD-10-CM | POA: Diagnosis not present

## 2020-08-21 DIAGNOSIS — Z89511 Acquired absence of right leg below knee: Secondary | ICD-10-CM | POA: Diagnosis not present

## 2020-08-21 DIAGNOSIS — L97519 Non-pressure chronic ulcer of other part of right foot with unspecified severity: Secondary | ICD-10-CM | POA: Diagnosis present

## 2020-08-21 DIAGNOSIS — D631 Anemia in chronic kidney disease: Secondary | ICD-10-CM | POA: Diagnosis present

## 2020-08-21 DIAGNOSIS — D619 Aplastic anemia, unspecified: Secondary | ICD-10-CM | POA: Diagnosis present

## 2020-08-21 DIAGNOSIS — F32A Depression, unspecified: Secondary | ICD-10-CM | POA: Diagnosis present

## 2020-08-21 DIAGNOSIS — E785 Hyperlipidemia, unspecified: Secondary | ICD-10-CM | POA: Diagnosis not present

## 2020-08-21 DIAGNOSIS — Z681 Body mass index (BMI) 19 or less, adult: Secondary | ICD-10-CM | POA: Diagnosis not present

## 2020-08-21 DIAGNOSIS — Z743 Need for continuous supervision: Secondary | ICD-10-CM | POA: Diagnosis not present

## 2020-08-21 DIAGNOSIS — R531 Weakness: Secondary | ICD-10-CM | POA: Diagnosis not present

## 2020-08-21 DIAGNOSIS — I70261 Atherosclerosis of native arteries of extremities with gangrene, right leg: Secondary | ICD-10-CM | POA: Diagnosis not present

## 2020-08-21 DIAGNOSIS — E11319 Type 2 diabetes mellitus with unspecified diabetic retinopathy without macular edema: Secondary | ICD-10-CM | POA: Diagnosis not present

## 2020-08-21 DIAGNOSIS — N2581 Secondary hyperparathyroidism of renal origin: Secondary | ICD-10-CM | POA: Diagnosis present

## 2020-08-21 DIAGNOSIS — I5043 Acute on chronic combined systolic (congestive) and diastolic (congestive) heart failure: Secondary | ICD-10-CM | POA: Diagnosis not present

## 2020-08-21 DIAGNOSIS — I5042 Chronic combined systolic (congestive) and diastolic (congestive) heart failure: Secondary | ICD-10-CM | POA: Diagnosis not present

## 2020-08-21 DIAGNOSIS — Z4781 Encounter for orthopedic aftercare following surgical amputation: Secondary | ICD-10-CM | POA: Diagnosis not present

## 2020-08-21 DIAGNOSIS — W19XXXA Unspecified fall, initial encounter: Secondary | ICD-10-CM | POA: Diagnosis not present

## 2020-08-21 DIAGNOSIS — I70203 Unspecified atherosclerosis of native arteries of extremities, bilateral legs: Secondary | ICD-10-CM | POA: Diagnosis present

## 2020-08-21 DIAGNOSIS — I255 Ischemic cardiomyopathy: Secondary | ICD-10-CM | POA: Diagnosis present

## 2020-08-21 DIAGNOSIS — E43 Unspecified severe protein-calorie malnutrition: Secondary | ICD-10-CM | POA: Diagnosis present

## 2020-08-21 DIAGNOSIS — I739 Peripheral vascular disease, unspecified: Secondary | ICD-10-CM | POA: Diagnosis not present

## 2020-08-21 DIAGNOSIS — R079 Chest pain, unspecified: Secondary | ICD-10-CM | POA: Diagnosis present

## 2020-08-21 DIAGNOSIS — E8779 Other fluid overload: Secondary | ICD-10-CM | POA: Diagnosis not present

## 2020-08-21 DIAGNOSIS — Z20822 Contact with and (suspected) exposure to covid-19: Secondary | ICD-10-CM | POA: Diagnosis present

## 2020-08-21 DIAGNOSIS — Z89512 Acquired absence of left leg below knee: Secondary | ICD-10-CM | POA: Diagnosis not present

## 2020-08-21 DIAGNOSIS — R5381 Other malaise: Secondary | ICD-10-CM | POA: Diagnosis not present

## 2020-08-21 DIAGNOSIS — I2510 Atherosclerotic heart disease of native coronary artery without angina pectoris: Secondary | ICD-10-CM | POA: Diagnosis present

## 2020-08-21 DIAGNOSIS — T8781 Dehiscence of amputation stump: Secondary | ICD-10-CM | POA: Diagnosis not present

## 2020-08-21 DIAGNOSIS — H409 Unspecified glaucoma: Secondary | ICD-10-CM | POA: Diagnosis present

## 2020-08-21 DIAGNOSIS — Z8673 Personal history of transient ischemic attack (TIA), and cerebral infarction without residual deficits: Secondary | ICD-10-CM | POA: Diagnosis not present

## 2020-08-21 DIAGNOSIS — N186 End stage renal disease: Secondary | ICD-10-CM | POA: Diagnosis present

## 2020-08-21 DIAGNOSIS — E876 Hypokalemia: Secondary | ICD-10-CM | POA: Diagnosis present

## 2020-08-21 DIAGNOSIS — I132 Hypertensive heart and chronic kidney disease with heart failure and with stage 5 chronic kidney disease, or end stage renal disease: Secondary | ICD-10-CM | POA: Diagnosis present

## 2020-08-21 DIAGNOSIS — E11621 Type 2 diabetes mellitus with foot ulcer: Secondary | ICD-10-CM | POA: Diagnosis present

## 2020-08-21 DIAGNOSIS — Z7982 Long term (current) use of aspirin: Secondary | ICD-10-CM | POA: Diagnosis not present

## 2020-08-21 DIAGNOSIS — N400 Enlarged prostate without lower urinary tract symptoms: Secondary | ICD-10-CM | POA: Diagnosis present

## 2020-08-21 DIAGNOSIS — S98111D Complete traumatic amputation of right great toe, subsequent encounter: Secondary | ICD-10-CM | POA: Diagnosis not present

## 2020-08-21 DIAGNOSIS — R410 Disorientation, unspecified: Secondary | ICD-10-CM | POA: Diagnosis not present

## 2020-08-21 DIAGNOSIS — M2550 Pain in unspecified joint: Secondary | ICD-10-CM | POA: Diagnosis not present

## 2020-08-21 DIAGNOSIS — I10 Essential (primary) hypertension: Secondary | ICD-10-CM | POA: Diagnosis not present

## 2020-08-21 DIAGNOSIS — Z87891 Personal history of nicotine dependence: Secondary | ICD-10-CM | POA: Diagnosis not present

## 2020-08-21 DIAGNOSIS — Z992 Dependence on renal dialysis: Secondary | ICD-10-CM | POA: Diagnosis not present

## 2020-08-21 DIAGNOSIS — D62 Acute posthemorrhagic anemia: Secondary | ICD-10-CM | POA: Diagnosis not present

## 2020-08-21 DIAGNOSIS — Z7401 Bed confinement status: Secondary | ICD-10-CM | POA: Diagnosis not present

## 2020-08-21 DIAGNOSIS — R0789 Other chest pain: Secondary | ICD-10-CM | POA: Diagnosis not present

## 2020-08-21 DIAGNOSIS — E1151 Type 2 diabetes mellitus with diabetic peripheral angiopathy without gangrene: Secondary | ICD-10-CM | POA: Diagnosis not present

## 2020-08-21 DIAGNOSIS — I5022 Chronic systolic (congestive) heart failure: Secondary | ICD-10-CM | POA: Diagnosis present

## 2020-08-21 DIAGNOSIS — R279 Unspecified lack of coordination: Secondary | ICD-10-CM | POA: Diagnosis not present

## 2020-08-21 DIAGNOSIS — F4321 Adjustment disorder with depressed mood: Secondary | ICD-10-CM | POA: Diagnosis not present

## 2020-08-21 DIAGNOSIS — E118 Type 2 diabetes mellitus with unspecified complications: Secondary | ICD-10-CM | POA: Diagnosis not present

## 2020-08-21 DIAGNOSIS — D510 Vitamin B12 deficiency anemia due to intrinsic factor deficiency: Secondary | ICD-10-CM | POA: Diagnosis present

## 2020-08-21 DIAGNOSIS — E1122 Type 2 diabetes mellitus with diabetic chronic kidney disease: Secondary | ICD-10-CM | POA: Diagnosis present

## 2020-08-21 DIAGNOSIS — T8189XA Other complications of procedures, not elsewhere classified, initial encounter: Secondary | ICD-10-CM | POA: Diagnosis not present

## 2020-08-21 DIAGNOSIS — Z7901 Long term (current) use of anticoagulants: Secondary | ICD-10-CM | POA: Diagnosis not present

## 2020-08-23 ENCOUNTER — Encounter: Payer: Medicare PPO | Admitting: Student

## 2020-08-23 DIAGNOSIS — N2581 Secondary hyperparathyroidism of renal origin: Secondary | ICD-10-CM | POA: Diagnosis not present

## 2020-08-23 DIAGNOSIS — N186 End stage renal disease: Secondary | ICD-10-CM | POA: Diagnosis not present

## 2020-08-23 DIAGNOSIS — Z992 Dependence on renal dialysis: Secondary | ICD-10-CM | POA: Diagnosis not present

## 2020-08-25 ENCOUNTER — Encounter (HOSPITAL_COMMUNITY): Payer: Self-pay | Admitting: Emergency Medicine

## 2020-08-25 ENCOUNTER — Emergency Department (HOSPITAL_COMMUNITY)
Admission: EM | Admit: 2020-08-25 | Discharge: 2020-08-25 | Disposition: A | Discharge status: Home / Self Care | Payer: Medicare PPO | Attending: Emergency Medicine | Admitting: Emergency Medicine

## 2020-08-25 ENCOUNTER — Emergency Department (HOSPITAL_COMMUNITY): Payer: Medicare PPO

## 2020-08-25 ENCOUNTER — Other Ambulatory Visit: Payer: Self-pay

## 2020-08-25 DIAGNOSIS — E1122 Type 2 diabetes mellitus with diabetic chronic kidney disease: Secondary | ICD-10-CM | POA: Diagnosis not present

## 2020-08-25 DIAGNOSIS — I132 Hypertensive heart and chronic kidney disease with heart failure and with stage 5 chronic kidney disease, or end stage renal disease: Secondary | ICD-10-CM | POA: Diagnosis not present

## 2020-08-25 DIAGNOSIS — E11319 Type 2 diabetes mellitus with unspecified diabetic retinopathy without macular edema: Secondary | ICD-10-CM | POA: Insufficient documentation

## 2020-08-25 DIAGNOSIS — Z992 Dependence on renal dialysis: Secondary | ICD-10-CM | POA: Diagnosis not present

## 2020-08-25 DIAGNOSIS — N2581 Secondary hyperparathyroidism of renal origin: Secondary | ICD-10-CM | POA: Diagnosis not present

## 2020-08-25 DIAGNOSIS — I5043 Acute on chronic combined systolic (congestive) and diastolic (congestive) heart failure: Secondary | ICD-10-CM | POA: Diagnosis not present

## 2020-08-25 DIAGNOSIS — R079 Chest pain, unspecified: Secondary | ICD-10-CM | POA: Diagnosis not present

## 2020-08-25 DIAGNOSIS — Z7982 Long term (current) use of aspirin: Secondary | ICD-10-CM | POA: Insufficient documentation

## 2020-08-25 DIAGNOSIS — Z7902 Long term (current) use of antithrombotics/antiplatelets: Secondary | ICD-10-CM | POA: Diagnosis not present

## 2020-08-25 DIAGNOSIS — Z87891 Personal history of nicotine dependence: Secondary | ICD-10-CM | POA: Diagnosis not present

## 2020-08-25 DIAGNOSIS — N186 End stage renal disease: Secondary | ICD-10-CM | POA: Diagnosis not present

## 2020-08-25 DIAGNOSIS — R0789 Other chest pain: Secondary | ICD-10-CM | POA: Insufficient documentation

## 2020-08-25 LAB — CBC
HCT: 30.5 % — ABNORMAL LOW (ref 39.0–52.0)
Hemoglobin: 9.5 g/dL — ABNORMAL LOW (ref 13.0–17.0)
MCH: 30.8 pg (ref 26.0–34.0)
MCHC: 31.1 g/dL (ref 30.0–36.0)
MCV: 99 fL (ref 80.0–100.0)
Platelets: 449 10*3/uL — ABNORMAL HIGH (ref 150–400)
RBC: 3.08 MIL/uL — ABNORMAL LOW (ref 4.22–5.81)
RDW: 16.3 % — ABNORMAL HIGH (ref 11.5–15.5)
WBC: 15.4 10*3/uL — ABNORMAL HIGH (ref 4.0–10.5)
nRBC: 0 % (ref 0.0–0.2)

## 2020-08-25 LAB — BASIC METABOLIC PANEL
Anion gap: 17 — ABNORMAL HIGH (ref 5–15)
BUN: 18 mg/dL (ref 8–23)
CO2: 27 mmol/L (ref 22–32)
Calcium: 8.7 mg/dL — ABNORMAL LOW (ref 8.9–10.3)
Chloride: 93 mmol/L — ABNORMAL LOW (ref 98–111)
Creatinine, Ser: 4.25 mg/dL — ABNORMAL HIGH (ref 0.61–1.24)
GFR, Estimated: 14 mL/min — ABNORMAL LOW (ref >60)
Glucose, Bld: 197 mg/dL — ABNORMAL HIGH (ref 70–99)
Potassium: 3.4 mmol/L — ABNORMAL LOW (ref 3.5–5.1)
Sodium: 137 mmol/L (ref 135–145)

## 2020-08-25 LAB — TROPONIN I (HIGH SENSITIVITY)
Troponin I (High Sensitivity): 49 ng/L — ABNORMAL HIGH (ref <18)
Troponin I (High Sensitivity): 56 ng/L — ABNORMAL HIGH (ref <18)

## 2020-08-25 MED ORDER — ASPIRIN 81 MG PO CHEW
324.0000 mg | CHEWABLE_TABLET | Freq: Once | ORAL | Status: AC
  Administered 2020-08-25: 324 mg via ORAL
  Filled 2020-08-25: qty 4

## 2020-08-25 NOTE — Discharge Instructions (Signed)
Be sure to follow-up with cardiology on this matter.

## 2020-08-25 NOTE — ED Provider Notes (Signed)
[facility] EMERGENCY DEPARTMENT Provider Note   CSN: [id number] Arrival date & time: 08/25/20  1402     History Chief Complaint  Patient presents with  . Chest Pain    Dennis Macias is a 70 y.o. male.  HPI      Dennis Macias is a 70 y.o. male, with a history of CHF, ESRD on dialysis, DM, HTN, hyperlipidemia, presenting to the ED with chest pain he states began during dialysis. He gives a vague description of this pain, states it was in the central chest, and "lasted for a little while."  He states the pain resolved and did not recur.  Denies fever/chills, cough, shortness of breath, N/V/D, abdominal pain, dizziness, syncope, diaphoresis, or any other complaints.   Past Medical History:  Diagnosis Date  . Anemia   . Arthritis    past hx   . Blindness    right eye r/t diabetes per wife [name]  . Cardiorenal syndrome   . Cataract    removed both eyes  . CHF (congestive heart failure) (HCC)    hx  . CKD (chronic kidney disease)    Dialysis T Th Sat  . Dehydration   . Diabetes ([place])    type 2 - diet controlled, no meds  . Glaucoma   . History of CVA (cerebrovascular accident) 09/13/2015  . History of stroke 09/13/2015  . History of urinary retention   . HOH (hard of hearing)    no hearing aids  . Hyperlipidemia   . Hypertension   . NSTEMI (non-ST elevated myocardial infarction) ([place])   . Peripheral vascular disease ([place])   . Pernicious anemia 02/24/2018  . S/P TURP   . Stroke [facility])    2017- March, no deficit   . Syncope 11/2019  . Tachycardia 08/26/2017  . Tubular adenoma of colon 02/2017  . Walker as ambulation aid    and occasional uses cane  . Weight loss, non-intentional 08/26/2017   10 lbs between 6/18 & 2/19    Patient Active Problem List   Diagnosis Date Noted  . Fever   . Nonerosive esophageal reflux disease 08/02/2020  . Hiatal hernia with GERD and esophagitis 08/02/2020  . Gastroesophageal reflux disease with esophagitis   . Dark stools    . Stroke ([place]) 07/29/2020  . Syncope 07/28/2020  . Acute cystitis 05/03/2020  . S/P BKA (below knee amputation) ([place]) 03/31/2020  . PAD (peripheral artery disease) ([place]) 02/18/2020  . Orthostatic hypotension 02/18/2020  . Acute cerebrovascular accident (CVA) due to ischemia ([place]) 02/17/2020  . ESRD (end stage renal disease) ([place])   . Goals of care, counseling/discussion   . Acute on chronic combined systolic and diastolic CHF (congestive heart failure) ([place]) 12/24/2019  . Non-Obstructive CAD 12/14/2019  . BPH (benign prostatic hyperplasia) 12/08/2019  . Anemia of chronic disease   . Protein-calorie malnutrition, severe 11/19/2019  . MSSA bacteremia   . Gait disorder   . Chronic combined systolic and diastolic heart failure ([place])   . Left renal mass 07/21/2019  . CKD (chronic kidney disease) stage 4, GFR 15-29 ml/min (HCC) 04/23/2019  . Weight loss, non-intentional 08/26/2017  . 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 Anemia 12/21/2016  . Hyperlipidemia 09/13/2015  . Diabetes mellitus with retinopathy of both eyes ([place])   . Essential hypertension     Past Surgical History:  Procedure Laterality Date  . ABDOMINAL AORTOGRAM W/LOWER EXTREMITY N/A 02/09/2020   Procedure: ABDOMINAL AORTOGRAM W/LOWER EXTREMITY;  Surgeon: Serafina Mitchell, MD;  Location: [facility] CV LAB;  Service: Cardiovascular;  Laterality: N/A;  . ABDOMINAL AORTOGRAM W/LOWER EXTREMITY N/A 05/10/2020   Procedure: ABDOMINAL AORTOGRAM W/LOWER EXTREMITY;  Surgeon: Serafina Mitchell, MD;  Location: [facility] CV LAB;  Service: Cardiovascular;  Laterality: N/A;  . ABDOMINAL AORTOGRAM W/LOWER EXTREMITY N/A 08/05/2020   Procedure: ABDOMINAL AORTOGRAM W/LOWER EXTREMITY;  Surgeon: Angelia Mould, MD;  Location: [facility] CV [facility];  Service: Cardiovascular;  Laterality: N/A;  . AMPUTATION Left 03/31/2020   Procedure: LEFT BELOW KNEE AMPUTATION;  Surgeon: Serafina Mitchell, MD;  Location: [facility] OR;  Service: Vascular;  Laterality:  Left;  MAC anesthesia with regional block   WILL NEED DIALYSIS THURSDAY  . AV FISTULA PLACEMENT Right 01/19/2020   Procedure: BRACHIOCEPHALIC ARTERIOVENOUS (AV) FISTULA CREATION;  Surgeon: Waynetta Sandy, MD;  Location: [facility];  Service: Vascular;  Laterality: Right;  . BUBBLE STUDY  08/08/2020   Procedure: BUBBLE STUDY;  Surgeon: Elouise Munroe, MD;  Location: [facility];  Service: Cardiology;;  . CATARACT EXTRACTION, BILATERAL    . COLONOSCOPY    . ESOPHAGOGASTRODUODENOSCOPY (EGD) WITH PROPOFOL N/A 08/02/2020   Procedure: ESOPHAGOGASTRODUODENOSCOPY (EGD) WITH PROPOFOL;  Surgeon: Irene Shipper, MD;  Location[facility];  Service: Endoscopy;  Laterality: N/A;  . IR FLUORO GUIDE CV LINE RIGHT  01/14/2020  . IR THORACENTESIS ASP PLEURAL SPACE W/IMG GUIDE  09/21/2019  . IR THORACENTESIS ASP PLEURAL SPACE W/IMG GUIDE  10/16/2019  . IR US GUIDE VASC ACCESS RIGHT  01/15/2020  . LEFT HEART CATH AND CORONARY ANGIOGRAPHY N/A 11/19/2019   Procedure: LEFT HEART CATH AND CORONARY ANGIOGRAPHY;  Surgeon: Jettie Booze, MD;  Location: [facility] CV LAB;  Service: Cardiovascular;  Laterality: N/A;  . PERIPHERAL VASCULAR BALLOON ANGIOPLASTY Right 05/10/2020   Procedure: PERIPHERAL VASCULAR BALLOON ANGIOPLASTY;  Surgeon: Serafina Mitchell, MD;  Location: [facility];  Service: Cardiovascular;  Laterality: Right;  PT  . PERIPHERAL VASCULAR INTERVENTION Left 02/09/2020   Procedure: PERIPHERAL VASCULAR INTERVENTION;  Surgeon: Serafina Mitchell, MD;  Location: [facility];  Service: Cardiovascular;  Laterality: Left;  . PERIPHERAL VASCULAR INTERVENTION Right 08/05/2020   Procedure: PERIPHERAL VASCULAR INTERVENTION;  Surgeon: Angelia Mould, MD;  Location: [facility];  Service: Cardiovascular;  Laterality: Right;  . POLYPECTOMY    . REFRACTIVE SURGERY  10/2017  . TEE WITHOUT CARDIOVERSION N/A 09/14/2015   Procedure: TRANSESOPHAGEAL ECHOCARDIOGRAM (TEE);  Surgeon: Larey Dresser, MD;  Location: [facility];  Service: Cardiovascular;  Laterality: N/A;  . TEE WITHOUT CARDIOVERSION N/A 08/08/2020   Procedure: TRANSESOPHAGEAL ECHOCARDIOGRAM (TEE);  Surgeon: Elouise Munroe, MD;  Location: [facility];  Service: Cardiology;  Laterality: N/A;  . TRANSMETATARSAL AMPUTATION Right 08/11/2020   Procedure: RIGHT TRANSMETATARSAL AMPUTATION;  Surgeon: Waynetta Sandy, MD;  Location: [facility];  Service: Vascular;  Laterality: Right;  . TRANSURETHRAL RESECTION OF PROSTATE N/A 10/20/2019   Procedure: TRANSURETHRAL RESECTION OF THE PROSTATE (TURP);  Surgeon: Irine Seal, MD;  Location: [facility];  Service: Urology;  Laterality: N/A;  . UPPER GASTROINTESTINAL ENDOSCOPY         Family History  Problem Relation Age of Onset  . Hypertension Mother   . Hyperlipidemia Mother   . Hyperlipidemia Father   . Colon cancer Neg Hx   . Colon polyps Neg Hx   . Esophageal cancer Neg Hx   . Rectal cancer Neg Hx   . Stomach cancer Neg Hx     Social History  Tobacco Use  . Smoking status: Former Smoker    Types: Cigarettes  . Smokeless tobacco: Former Systems developer    Types: Chew    Quit date: 07/09/1978  . Tobacco comment: quit yrs ago  Vaping Use  . Vaping Use: Never used  Substance Use Topics  . Alcohol use: Not Currently    Alcohol/week: 1.0 standard drink    Types: 1 Cans of beer per week    Comment: quit last march/2017  . Drug use: No    Home Medications Prior to Admission medications   Medication Sig Start Date End Date Taking? Authorizing Provider  Accu-Chek FastClix Lancets MISC Check blood sugar up to 7 times a week as instructed 12/03/19   Angiulli, Lavon Paganini, PA-C  acetaminophen (TYLENOL) 500 MG tablet Take 500 mg by mouth every 6 (six) hours as needed for moderate pain or headache.    [provider]  aspirin EC 81 MG tablet Take 1 tablet (81 mg total) by mouth daily. 10/22/19   Eugenie Filler, MD  atorvastatin (LIPITOR) 80 MG tablet Take 1 tablet (80  mg total) by mouth at bedtime. IM program 02/29/20   Madalyn Rob, MD  Blood Glucose Monitoring Suppl (ACCU-CHEK GUIDE) w/Device KIT 1 each by Does not apply route daily. Check blood sugar as instructed up to 7 times a week 12/03/19   Angiulli, Lavon Paganini, PA-C  clopidogrel (PLAVIX) 75 MG tablet Take 1 tablet (75 mg total) by mouth daily. 05/10/20   Serafina Mitchell, MD  Darbepoetin Alfa (ARANESP) 40 MCG/0.4ML SOSY injection Inject 0.4 mLs (40 mcg total) into the vein every Saturday with hemodialysis. 08/20/20   Iona Beard, MD  dorzolamide-timolol (COSOPT) 22.3-6.8 MG/ML ophthalmic solution Place 1 drop into the right eye 2 (two) times daily. 12/03/19   Angiulli, Lavon Paganini, PA-C  finasteride (PROSCAR) 5 MG tablet Take 1 tablet (5 mg total) by mouth daily. 02/29/20   Madalyn Rob, MD  glucose blood (ACCU-CHEK GUIDE) test strip Check blood sugar up to 7 times a week as instructed 12/03/19   Angiulli, Lavon Paganini, PA-C  lidocaine-prilocaine (EMLA) cream Apply 1 application topically daily as needed (prior to fistula use).    [provider]  mirtazapine (REMERON) 15 MG tablet Take 1 tablet (15 mg total) by mouth at bedtime. 08/19/20   Iona Beard, MD  pantoprazole (PROTONIX) 40 MG tablet Take 1 tablet (40 mg total) by mouth daily. 08/19/20 11/17/20  Iona Beard, MD  polyethylene glycol (MIRALAX / GLYCOLAX) 17 g packet Take 17 g by mouth daily as needed for moderate constipation.     [provider]  sevelamer carbonate (RENVELA) 800 MG tablet Take 800 mg by mouth See admin instructions. Take 800 mg by mouth three times a day with food on Sun/Mon/Wed/Fri and two times a day on Tues/Thurs/Sat    [provider]  vitamin B-12 (CYANOCOBALAMIN) 1000 MCG tablet Take 1 tablet (1,000 mcg total) by mouth daily. 12/03/19   Angiulli, Lavon Paganini, PA-C    Allergies    Cefepime  Review of Systems   Review of Systems  Constitutional: Negative for chills, diaphoresis and fever.  Respiratory:  Negative for cough and shortness of breath.   Cardiovascular: Positive for chest pain. Negative for leg swelling.  Gastrointestinal: Negative for abdominal pain, diarrhea, nausea and vomiting.  Musculoskeletal: Negative for back pain.  Neurological: Negative for dizziness, syncope and weakness.  All other systems reviewed and are negative.   Physical Exam Updated Vital Signs BP 117/73 (  BP Location: Right Arm)   Pulse 88   Temp 98.3 F (36.8 C) (Oral)   Resp 17   Ht 6' (1.829 m)   Wt 60 kg   SpO2 95%   BMI 17.94 kg/m   Physical Exam Vitals and nursing note reviewed.  Constitutional:      General: He is not in acute distress.    Appearance: He is well-developed. He is not diaphoretic.  HENT:     Head: Normocephalic and atraumatic.     Mouth/Throat:     Mouth: Mucous membranes are moist.     Pharynx: Oropharynx is clear.  Eyes:     Conjunctiva/sclera: Conjunctivae normal.  Cardiovascular:     Rate and Rhythm: Normal rate and regular rhythm.     Pulses: Normal pulses.          Radial pulses are 2+ on the right side and 2+ on the left side.       Posterior tibial pulses are 2+ on the right side.     Heart sounds: Normal heart sounds.     Comments: Tactile temperature equal in the extremities bilaterally. Pulmonary:     Effort: Pulmonary effort is normal. No respiratory distress.     Breath sounds: Normal breath sounds.  Abdominal:     Palpations: Abdomen is soft.     Tenderness: There is no abdominal tenderness. There is no guarding.  Musculoskeletal:     Cervical back: Neck supple.     Right lower leg: No edema.     Left lower leg: No edema.     Left Lower Extremity: Left leg is amputated below knee.  Skin:    General: Skin is warm and dry.  Neurological:     Mental Status: He is alert.     Comments: Motor function intact in the patient's upper and lower extremities. No noted facial droop or phonation abnormality.  Psychiatric:        Mood and Affect: Mood and  affect normal.        Speech: Speech normal.        Behavior: Behavior normal.     ED Results / Procedures / Treatments   Labs (all labs ordered are listed, but only abnormal results are displayed) Labs Reviewed  BASIC METABOLIC PANEL - Abnormal; Notable for the following components:      Result Value   Potassium 3.4 (*)    Chloride 93 (*)    Glucose, Bld 197 (*)    Creatinine, Ser 4.25 (*)    Calcium 8.7 (*)    GFR, Estimated 14 (*)    Anion gap 17 (*)    All other components within normal limits  CBC - Abnormal; Notable for the following components:   WBC 15.4 (*)    RBC 3.08 (*)    Hemoglobin 9.5 (*)    HCT 30.5 (*)    RDW 16.3 (*)    Platelets 449 (*)    All other components within normal limits  TROPONIN I (HIGH SENSITIVITY) - Abnormal; Notable for the following components:   Troponin I (High Sensitivity) 49 (*)    All other components within normal limits  TROPONIN I (HIGH SENSITIVITY) - Abnormal; Notable for the following components:   Troponin I (High Sensitivity) 56 (*)    All other components within normal limits    WBC  Date Value Ref Range Status  08/25/2020 15.4 (H) 4.0 - 10.5 K/uL Final  08/20/2020 14.5 (H) 4.0 - 10.5 K/uL Final  08/19/2020 15.6 (H) 4.0 - 10.5 K/uL Final  08/18/2020 14.1 (H) 4.0 - 10.5 K/uL Final   Hemoglobin  Date Value Ref Range Status  08/25/2020 9.5 (L) 13.0 - 17.0 g/dL Final  08/20/2020 10.0 (L) 13.0 - 17.0 g/dL Final  08/19/2020 10.1 (L) 13.0 - 17.0 g/dL Final  08/18/2020 8.5 (L) 13.0 - 17.0 g/dL Final  05/02/2020 11.1 (L) 13.0 - 17.7 g/dL Final  12/10/2019 9.6 (L) 13.0 - 17.7 g/dL Final  08/13/2019 7.7 (L) 13.0 - 17.7 g/dL Final  02/24/2018 10.3 (L) 13.0 - 17.7 g/dL Final    EKG EKG Interpretation  Date/Time:  Thursday August 25 2020 18:04:42 EST Ventricular Rate:  79 PR Interval:    QRS Duration: 173 QT Interval:  446 QTC Calculation: 512 R Axis:   -86 Text Interpretation: Sinus rhythm Short PR interval RBBB and  LAFB Left ventricular hypertrophy No significant change since last tracing Confirmed by Isla Pence [phone]) on 08/25/2020 6:07:18 PM Also confirmed by Isla Pence [phone]), editor Hattie Perch ([id number])  on 08/26/2020 7:57:36 AM   Radiology DG Chest 2 View  Result Date: 08/25/2020 CLINICAL DATA:  Chest pain in a 70 year old male EXAM: CHEST - 2 VIEW COMPARISON:  Degenerate 2720.2 FINDINGS: Trachea midline. Cardiomediastinal contours and hilar structures are normal. Lungs are clear. On limited assessment no acute skeletal process. IMPRESSION: No active cardiopulmonary disease. Electronically Signed   By: Zetta Bills M.D.   On: 08/25/2020 15:10    Procedures Procedures   Medications Ordered in ED Medications  aspirin chewable tablet 324 mg (324 mg Oral Given 08/25/20 1805)    ED Course  I have reviewed the triage vital signs and the nursing notes.  Pertinent labs & imaging results that were available during my care of the patient were reviewed by me and considered in my medical decision making (see chart for details).    MDM Rules/Calculators/A&P                          Patient presents with episode of chest pain with vague description that resolved prior to arrival. Patient is nontoxic appearing, afebrile, not tachycardic on my assessment, not tachypneic, not hypotensive, maintains excellent SPO2 on room air, and is in no apparent distress.   I have reviewed the patient's chart to obtain more information.   I reviewed and interpreted the patient's labs and radiological studies. No acute abnormalities on EKG.  Leukocytosis seems to be baseline for the patient. Chest x-ray unremarkable.  Troponin stable.  Recommend cardiology follow-up. The patient was given instructions for home care as well as return precautions. Patient voices understanding of these instructions, accepts the plan, and is comfortable with discharge.   Findings and plan of care discussed with attending  physician, Isla Pence, MD. Dr. Gilford Raid personally evaluated and examined this patient.  Final Clinical Impression(s) / ED Diagnoses Final diagnoses:  Atypical chest pain    Rx / DC Orders ED Discharge Orders    None       Layla Maw 08/26/20 0930    Isla Pence, MD 08/30/20 [phone]

## 2020-08-25 NOTE — ED Triage Notes (Signed)
Patient BIB EMS from dialysis center for evaluation of right sided chest pain. Patient is very hard of hearing, denies pain at this time. Patient is alert and in no apparent distress at this time.

## 2020-08-25 NOTE — ED Notes (Signed)
Patient BIB EMS from dialysis which was ended one hour early due to right sided chest pain. Patient denies shortness of breath. Dialysis access in right arm, T,Th,Sat schedule.

## 2020-08-25 NOTE — ED Notes (Signed)
Spoke with pt's spouse  About pt d/c and was upset that no one called her to alert her. Pt spouse happy that she knows where he is now. I let her know that I will give report to facility [facility] on [address].)

## 2020-08-26 LAB — HM DIABETES EYE EXAM

## 2020-08-27 DIAGNOSIS — Z992 Dependence on renal dialysis: Secondary | ICD-10-CM | POA: Diagnosis not present

## 2020-08-27 DIAGNOSIS — N2581 Secondary hyperparathyroidism of renal origin: Secondary | ICD-10-CM | POA: Diagnosis not present

## 2020-08-27 DIAGNOSIS — N186 End stage renal disease: Secondary | ICD-10-CM | POA: Diagnosis not present

## 2020-08-29 ENCOUNTER — Ambulatory Visit: Payer: Medicare PPO | Admitting: *Deleted

## 2020-08-29 DIAGNOSIS — I5042 Chronic combined systolic (congestive) and diastolic (congestive) heart failure: Secondary | ICD-10-CM

## 2020-08-29 DIAGNOSIS — N186 End stage renal disease: Secondary | ICD-10-CM

## 2020-08-29 DIAGNOSIS — E11319 Type 2 diabetes mellitus with unspecified diabetic retinopathy without macular edema: Secondary | ICD-10-CM

## 2020-08-29 DIAGNOSIS — I2510 Atherosclerotic heart disease of native coronary artery without angina pectoris: Secondary | ICD-10-CM

## 2020-08-29 NOTE — Progress Notes (Signed)
Internal Medicine Clinic Resident  I have personally reviewed this encounter including the documentation in this note and/or discussed this patient with the care management provider. I will address any urgent items identified by the care management provider and will communicate my actions to the patient's PCP. I have reviewed the patient's CCM visit with my supervising attending, Dr Dareen Piano.  Foy Guadal[place], MD 08/29/2020

## 2020-08-29 NOTE — Chronic Care Management (AMB) (Signed)
   08/29/2020  Dorita Fray October 30, 1950 [id number]  Spoke with patient's significant other [name] with goal of completing transition of care assessment. This CCM RN received notification via PING that patient was discharge from SNF on 08/25/20 but the information was incorrect. . She states patient remains in [facility] and she is very frustrated because patient is unable to ambulate and she says she will not be able to lift him to help with bed to chair transfers when he comes home. She is  also concerned that she is going to lose her job and health insurance because she has to take so much time off from work to help patient get to MD appointments, dialysis etc. She was tearful , stating. "I don't know what to do. I want Deveon to get good care but I just can't take care of him and continue to work. Biomedical engineer also says the facility called her today to advise her that patient is not participating in therapies and asking her to persuade patient to work with rehabilitation therapist. [name] says she has spoken with patient about the importance of working with the therapist many times without success in getting him to participate. With [name]'s permission, spoke with [facility] discharge planner. Lalla Brothers, and explained [name]'s concerns. Ms Bobette Mo states she will reach out to [facility] to discuss discharge planning options with anticipated discharge date of 09/07/20 per patient's health insurance and will also ask for a contracted talk therapist to speak with patient about his goals of care.  Kelli Churn RN, CCM, [facility] RN Care Manager [phone]

## 2020-08-30 DIAGNOSIS — Z992 Dependence on renal dialysis: Secondary | ICD-10-CM | POA: Diagnosis not present

## 2020-08-30 DIAGNOSIS — N2581 Secondary hyperparathyroidism of renal origin: Secondary | ICD-10-CM | POA: Diagnosis not present

## 2020-08-30 DIAGNOSIS — N186 End stage renal disease: Secondary | ICD-10-CM | POA: Diagnosis not present

## 2020-09-01 ENCOUNTER — Telehealth: Payer: Self-pay

## 2020-09-01 DIAGNOSIS — R531 Weakness: Secondary | ICD-10-CM | POA: Diagnosis not present

## 2020-09-01 DIAGNOSIS — Z89512 Acquired absence of left leg below knee: Secondary | ICD-10-CM | POA: Diagnosis not present

## 2020-09-01 DIAGNOSIS — N2581 Secondary hyperparathyroidism of renal origin: Secondary | ICD-10-CM | POA: Diagnosis not present

## 2020-09-01 DIAGNOSIS — Z992 Dependence on renal dialysis: Secondary | ICD-10-CM | POA: Diagnosis not present

## 2020-09-01 DIAGNOSIS — Z7901 Long term (current) use of anticoagulants: Secondary | ICD-10-CM | POA: Diagnosis not present

## 2020-09-01 DIAGNOSIS — I739 Peripheral vascular disease, unspecified: Secondary | ICD-10-CM | POA: Diagnosis not present

## 2020-09-01 DIAGNOSIS — N186 End stage renal disease: Secondary | ICD-10-CM | POA: Diagnosis not present

## 2020-09-01 DIAGNOSIS — W19XXXA Unspecified fall, initial encounter: Secondary | ICD-10-CM | POA: Diagnosis not present

## 2020-09-01 NOTE — Progress Notes (Signed)
Internal Medicine Clinic Attending  CCM services provided by the care management provider and their documentation were discussed with Dr. Wynetta Emery. We reviewed the pertinent findings, urgent action items addressed by the resident and non-urgent items to be addressed by the PCP.  I agree with the assessment, diagnosis, and plan of care documented in the CCM and resident's note.  Aldine Contes, MD 09/01/2020

## 2020-09-03 DIAGNOSIS — N186 End stage renal disease: Secondary | ICD-10-CM | POA: Diagnosis not present

## 2020-09-03 DIAGNOSIS — N2581 Secondary hyperparathyroidism of renal origin: Secondary | ICD-10-CM | POA: Diagnosis not present

## 2020-09-03 DIAGNOSIS — Z992 Dependence on renal dialysis: Secondary | ICD-10-CM | POA: Diagnosis not present

## 2020-09-05 ENCOUNTER — Encounter: Payer: Medicare PPO | Admitting: Surgery

## 2020-09-05 ENCOUNTER — Encounter (INDEPENDENT_AMBULATORY_CARE_PROVIDER_SITE_OTHER): Payer: Medicare PPO | Admitting: Ophthalmology

## 2020-09-05 DIAGNOSIS — Z992 Dependence on renal dialysis: Secondary | ICD-10-CM | POA: Diagnosis not present

## 2020-09-05 DIAGNOSIS — E1122 Type 2 diabetes mellitus with diabetic chronic kidney disease: Secondary | ICD-10-CM | POA: Diagnosis not present

## 2020-09-05 DIAGNOSIS — N186 End stage renal disease: Secondary | ICD-10-CM | POA: Diagnosis not present

## 2020-09-06 ENCOUNTER — Other Ambulatory Visit: Payer: Self-pay

## 2020-09-06 ENCOUNTER — Ambulatory Visit (INDEPENDENT_AMBULATORY_CARE_PROVIDER_SITE_OTHER): Payer: Medicare PPO | Admitting: Vascular Surgery

## 2020-09-06 ENCOUNTER — Encounter: Payer: Self-pay | Admitting: Vascular Surgery

## 2020-09-06 VITALS — BP 97/50 | HR 82 | Temp 97.9°F | Resp 20 | Ht 72.0 in | Wt 132.0 lb

## 2020-09-06 DIAGNOSIS — N186 End stage renal disease: Secondary | ICD-10-CM | POA: Diagnosis not present

## 2020-09-06 DIAGNOSIS — Z992 Dependence on renal dialysis: Secondary | ICD-10-CM | POA: Diagnosis not present

## 2020-09-06 DIAGNOSIS — N2581 Secondary hyperparathyroidism of renal origin: Secondary | ICD-10-CM | POA: Diagnosis not present

## 2020-09-06 DIAGNOSIS — I7025 Atherosclerosis of native arteries of other extremities with ulceration: Secondary | ICD-10-CM

## 2020-09-06 NOTE — H&P (View-Only) (Signed)
  ASSESSMENT & PLAN:  70 y.o. male with atherosclerosis of native arteries of right lower extremity causing ulceration and tissue loss of R TMA incision. The TMA is not viable. Recommend R BKA. Plan for OR 09/08/20.   CHIEF COMPLAINT:   R TMA not healing  HISTORY:  HISTORY OF PRESENT ILLNESS: Dennis Macias is a 70 y.o. male well known to our service. He underwent a right lower extremity angiogram 05/10/20 with angioplasty of the posterior tibial artery and again 08/05/2020 with angioplasty of posterior tibial artery done for ulceration of the right great toe.  This was followed by a right transmetatarsal amputation 08/11/2020.  Fortunately the TMA has not healed well.  He is here to discuss options.  He has a left below-knee amputation which he has not ambulated on yet.  Past Medical History:  Diagnosis Date  . Anemia   . Arthritis    past hx   . Blindness    right eye r/t diabetes per wife [name]  . Cardiorenal syndrome   . Cataract    removed both eyes  . CHF (congestive heart failure) (HCC)    hx  . CKD (chronic kidney disease)    Dialysis T Th Sat  . Dehydration   . Diabetes (HCC)    type 2 - diet controlled, no meds  . Glaucoma   . History of CVA (cerebrovascular accident) 09/13/2015  . History of stroke 09/13/2015  . History of urinary retention   . HOH (hard of hearing)    no hearing aids  . Hyperlipidemia   . Hypertension   . NSTEMI (non-ST elevated myocardial infarction) (HCC)   . Peripheral vascular disease (HCC)   . Pernicious anemia 02/24/2018  . S/P TURP   . Stroke (HCC)    2017- March, no deficit   . Syncope 11/2019  . Tachycardia 08/26/2017  . Tubular adenoma of colon 02/2017  . Walker as ambulation aid    and occasional uses cane  . Weight loss, non-intentional 08/26/2017   10 lbs between 6/18 & 2/19    Past Surgical History:  Procedure Laterality Date  . ABDOMINAL AORTOGRAM W/LOWER EXTREMITY N/A 02/09/2020   Procedure: ABDOMINAL AORTOGRAM W/LOWER EXTREMITY;   Surgeon: Brabham, Vance W, MD;  Location: [facility];  Service: Cardiovascular;  Laterality: N/A;  . ABDOMINAL AORTOGRAM W/LOWER EXTREMITY N/A 05/10/2020   Procedure: ABDOMINAL AORTOGRAM W/LOWER EXTREMITY;  Surgeon: Brabham, Vance W, MD;  Location: [facility];  Service: Cardiovascular;  Laterality: N/A;  . ABDOMINAL AORTOGRAM W/LOWER EXTREMITY N/A 08/05/2020   Procedure: ABDOMINAL AORTOGRAM W/LOWER EXTREMITY;  Surgeon: Dickson, Christopher S, MD;  Location: [facility];  Service: Cardiovascular;  Laterality: N/A;  . AMPUTATION Left 03/31/2020   Procedure: LEFT BELOW KNEE AMPUTATION;  Surgeon: Brabham, Vance W, MD;  Location: [facility];  Service: Vascular;  Laterality: Left;  MAC anesthesia with regional block   WILL NEED DIALYSIS THURSDAY  . AV FISTULA PLACEMENT Right 01/19/2020   Procedure: BRACHIOCEPHALIC ARTERIOVENOUS (AV) FISTULA CREATION;  Surgeon: Cain, Brandon Christopher, MD;  Location: [facility];  Service: Vascular;  Laterality: Right;  . BUBBLE STUDY  08/08/2020   Procedure: BUBBLE STUDY;  Surgeon: Acharya, Gayatri A, MD;  Location: [facility];  Service: Cardiology;;  . CATARACT EXTRACTION, BILATERAL    . COLONOSCOPY    . ESOPHAGOGASTRODUODENOSCOPY (EGD) WITH PROPOFOL N/A 08/02/2020   Procedure: ESOPHAGOGASTRODUODENOSCOPY (EGD) WITH PROPOFOL;  Surgeon: Perry, John N, MD;  Location: [facility];  Service: Endoscopy;  Laterality:   N/A;  . IR FLUORO GUIDE CV LINE RIGHT  01/14/2020  . IR THORACENTESIS ASP PLEURAL SPACE W/IMG GUIDE  09/21/2019  . IR THORACENTESIS ASP PLEURAL SPACE W/IMG GUIDE  10/16/2019  . IR US GUIDE VASC ACCESS RIGHT  01/15/2020  . LEFT HEART CATH AND CORONARY ANGIOGRAPHY N/A 11/19/2019   Procedure: LEFT HEART CATH AND CORONARY ANGIOGRAPHY;  Surgeon: Varanasi, Jayadeep S, MD;  Location: [facility];  Service: Cardiovascular;  Laterality: N/A;  . PERIPHERAL VASCULAR BALLOON ANGIOPLASTY Right 05/10/2020   Procedure: PERIPHERAL VASCULAR BALLOON ANGIOPLASTY;   Surgeon: Brabham, Vance W, MD;  Location: [facility];  Service: Cardiovascular;  Laterality: Right;  PT  . PERIPHERAL VASCULAR INTERVENTION Left 02/09/2020   Procedure: PERIPHERAL VASCULAR INTERVENTION;  Surgeon: Brabham, Vance W, MD;  Location: [facility];  Service: Cardiovascular;  Laterality: Left;  . PERIPHERAL VASCULAR INTERVENTION Right 08/05/2020   Procedure: PERIPHERAL VASCULAR INTERVENTION;  Surgeon: Dickson, Christopher S, MD;  Location: [facility];  Service: Cardiovascular;  Laterality: Right;  . POLYPECTOMY    . REFRACTIVE SURGERY  10/2017  . TEE WITHOUT CARDIOVERSION N/A 09/14/2015   Procedure: TRANSESOPHAGEAL ECHOCARDIOGRAM (TEE);  Surgeon: Dalton S McLean, MD;  Location: [facility];  Service: Cardiovascular;  Laterality: N/A;  . TEE WITHOUT CARDIOVERSION N/A 08/08/2020   Procedure: TRANSESOPHAGEAL ECHOCARDIOGRAM (TEE);  Surgeon: Acharya, Gayatri A, MD;  Location: [facility];  Service: Cardiology;  Laterality: N/A;  . TRANSMETATARSAL AMPUTATION Right 08/11/2020   Procedure: RIGHT TRANSMETATARSAL AMPUTATION;  Surgeon: Cain, Brandon Christopher, MD;  Location: [facility];  Service: Vascular;  Laterality: Right;  . TRANSURETHRAL RESECTION OF PROSTATE N/A 10/20/2019   Procedure: TRANSURETHRAL RESECTION OF THE PROSTATE (TURP);  Surgeon: Wrenn, John, MD;  Location: [facility];  Service: Urology;  Laterality: N/A;  . UPPER GASTROINTESTINAL ENDOSCOPY      Family History  Problem Relation Age of Onset  . Hypertension Mother   . Hyperlipidemia Mother   . Hyperlipidemia Father   . Colon cancer Neg Hx   . Colon polyps Neg Hx   . Esophageal cancer Neg Hx   . Rectal cancer Neg Hx   . Stomach cancer Neg Hx     Social History   Socioeconomic History  . Marital status: Married    Spouse name: Not on file  . Number of children: Not on file  . Years of education: Not on file  . Highest education level: Not on file  Occupational History  . Not on file  Tobacco Use  .  Smoking status: Former Smoker    Types: Cigarettes  . Smokeless tobacco: Former User    Types: Chew    Quit date: 07/09/1978  . Tobacco comment: quit yrs ago  Vaping Use  . Vaping Use: Never used  Substance and Sexual Activity  . Alcohol use: Not Currently    Alcohol/week: 1.0 standard drink    Types: 1 Cans of beer per week    Comment: quit last march/2017  . Drug use: No  . Sexual activity: Not on file  Other Topics Concern  . Not on file  Social History Narrative  . Not on file   Social Determinants of Health   Financial Resource Strain: Not on file  Food Insecurity: Not on file  Transportation Needs: Not on file  Physical Activity: Not on file  Stress: Not on file  Social Connections: Not on file  Intimate Partner Violence: Not on file    Allergies  Allergen Reactions  .   Cefepime Other (See Comments)    Pt had BAD encephalopathy from Cefepime    Current Outpatient Medications  Medication Sig Dispense Refill  . Accu-Chek FastClix Lancets MISC Check blood sugar up to 7 times a week as instructed 102 each 3  . acetaminophen (TYLENOL) 500 MG tablet Take 500 mg by mouth every 6 (six) hours as needed for moderate pain or headache.    . aspirin EC 81 MG tablet Take 1 tablet (81 mg total) by mouth daily.    . atorvastatin (LIPITOR) 80 MG tablet Take 1 tablet (80 mg total) by mouth at bedtime. IM program 90 tablet 3  . Blood Glucose Monitoring Suppl (ACCU-CHEK GUIDE) w/Device KIT 1 each by Does not apply route daily. Check blood sugar as instructed up to 7 times a week 1 kit 0  . clopidogrel (PLAVIX) 75 MG tablet Take 1 tablet (75 mg total) by mouth daily. 30 tablet 11  . Darbepoetin Alfa (ARANESP) 40 MCG/0.4ML SOSY injection Inject 0.4 mLs (40 mcg total) into the vein every Saturday with hemodialysis. 8.4 mL 0  . dorzolamide-timolol (COSOPT) 22.3-6.8 MG/ML ophthalmic solution Place 1 drop into the right eye 2 (two) times daily. 10 mL 10  . finasteride (PROSCAR) 5 MG tablet  Take 1 tablet (5 mg total) by mouth daily. 90 tablet 1  . glucose blood (ACCU-CHEK GUIDE) test strip Check blood sugar up to 7 times a week as instructed 100 each 3  . lidocaine-prilocaine (EMLA) cream Apply 1 application topically daily as needed (prior to fistula use).    . mirtazapine (REMERON) 15 MG tablet Take 1 tablet (15 mg total) by mouth at bedtime. 30 tablet 0  . pantoprazole (PROTONIX) 40 MG tablet Take 1 tablet (40 mg total) by mouth daily. 30 tablet 2  . polyethylene glycol (MIRALAX / GLYCOLAX) 17 g packet Take 17 g by mouth daily as needed for moderate constipation.     . sevelamer carbonate (RENVELA) 800 MG tablet Take 800 mg by mouth See admin instructions. Take 800 mg by mouth three times a day with food on Sun/Mon/Wed/Fri and two times a day on Tues/Thurs/Sat    . vitamin B-12 (CYANOCOBALAMIN) 1000 MCG tablet Take 1 tablet (1,000 mcg total) by mouth daily. 30 tablet 0   No current facility-administered medications for this visit.    REVIEW OF SYSTEMS:  [X] denotes positive finding, [ ] denotes negative finding Cardiac  Comments:  Chest pain or chest pressure:    Shortness of breath upon exertion:    Short of breath when lying flat:    Irregular heart rhythm:        Vascular    Pain in calf, thigh, or hip brought on by ambulation:    Pain in feet at night that wakes you up from your sleep:     Blood clot in your veins:    Leg swelling:         Pulmonary    Oxygen at home:    Productive cough:     Wheezing:         Neurologic    Sudden weakness in arms or legs:     Sudden numbness in arms or legs:     Sudden onset of difficulty speaking or slurred speech:    Temporary loss of vision in one eye:     Problems with dizziness:         Gastrointestinal    Blood in stool:     Vomited blood:           Genitourinary    Burning when urinating:     Blood in urine:        Psychiatric    Major depression:         Hematologic    Bleeding problems:    Problems with  blood clotting too easily:        Skin    Rashes or ulcers:        Constitutional    Fever or chills:     PHYSICAL EXAM:   Vitals:   09/06/20 0818  BP: (!) 97/50  Pulse: 82  Resp: 20  Temp: 97.9 F (36.6 C)  SpO2: 95%  Weight: 132 lb (59.9 kg)  Height: 6' (1.829 m)   Constitutional: Elderly, chronically ill appearing in no distress. Appears marginally nourished.  Neurologic: CN intact. no focal findings. no sensory loss. Psychiatric: Mood and affect symmetric and appropriate. Eyes: No icterus. No conjunctival pallor. Ears, nose, throat: mucous membranes moist. Midline trachea.  Cardiac: regular rate and rhythm.  Respiratory: unlabored. Abdominal: soft, non-tender, non-distended.  Peripheral vascular:  R TMA not healing. Ischemic eschar across dorsal flap. Extremity: No edema. No cyanosis. No pallor.  Skin: No gangrene. No ulceration.  Lymphatic: No Stemmer's sign. No palpable lymphadenopathy.   DATA REVIEW:    Most recent CBC CBC Latest Ref Rng & Units 08/25/2020 08/20/2020 08/19/2020  WBC 4.0 - 10.5 K/uL 15.4(H) 14.5(H) 15.6(H)  Hemoglobin 13.0 - 17.0 g/dL 9.5(L) 10.0(L) 10.1(L)  Hematocrit 39.0 - 52.0 % 30.5(L) 32.7(L) 31.5(L)  Platelets 150 - 400 K/uL 449(H) 327 363     Most recent CMP CMP Latest Ref Rng & Units 08/25/2020 08/20/2020 08/19/2020  Glucose 70 - 99 mg/dL 197(H) 196(H) 194(H)  BUN 8 - 23 mg/dL 18 21 27(H)  Creatinine 0.61 - 1.24 mg/dL 4.25(H) 3.65(H) 4.70(H)  Sodium 135 - 145 mmol/L 137 139 137  Potassium 3.5 - 5.1 mmol/L 3.4(L) 3.7 4.1  Chloride 98 - 111 mmol/L 93(L) 94(L) 94(L)  CO2 22 - 32 mmol/L 27 28 27  Calcium 8.9 - 10.3 mg/dL 8.7(L) 8.8(L) 8.4(L)  Total Protein 6.5 - 8.1 g/dL - - -  Total Bilirubin 0.3 - 1.2 mg/dL - - -  Alkaline Phos 38 - 126 U/L - - -  AST 15 - 41 U/L - - -  ALT 0 - 44 U/L - - -    Renal function Estimated Creatinine Clearance: 13.9 mL/min (A) (by C-G formula based on SCr of 4.25 mg/dL (H)).  Hgb A1c MFr Bld (%)   Date Value  02/18/2020 6.4 (H)    LDL Chol Calc (NIH)  Date Value Ref Range Status  10/14/2019 49 0 - 99 mg/dL Final   LDL Cholesterol  Date Value Ref Range Status  02/18/2020 61 0 - 99 mg/dL Final    Comment:           Total Cholesterol/HDL:CHD Risk Coronary Heart Disease Risk Table                     Men   Women  1/2 Average Risk   3.4   3.3  Average Risk       5.0   4.4  2 X Average Risk   9.6   7.1  3 X Average Risk  23.4   11.0        Use the calculated Patient Ratio above and the CHD Risk Table to determine the patient's CHD Risk.        ATP   III CLASSIFICATION (LDL):  <100     mg/dL   Optimal  100-129  mg/dL   Near or Above                    Optimal  130-159  mg/dL   Borderline  160-189  mg/dL   High  >190     mg/dL   Very High Performed at [facility], [address] [place], [place] [address]     Kealie Barrie N. Amity Roes, MD [facility] Office Phone Number: [phone] 09/06/2020 8:33 AM    

## 2020-09-06 NOTE — Progress Notes (Signed)
ASSESSMENT & PLAN:  70 y.o. male with atherosclerosis of native arteries of right lower extremity causing ulceration and tissue loss of R TMA incision. The TMA is not viable. Recommend R BKA. Plan for OR 09/08/20.   CHIEF COMPLAINT:   R TMA not healing  HISTORY:  HISTORY OF PRESENT ILLNESS: Dennis Macias is a 70 y.o. male well known to our service. He underwent a right lower extremity angiogram 05/10/20 with angioplasty of the posterior tibial artery and again 08/05/2020 with angioplasty of posterior tibial artery done for ulceration of the right great toe.  This was followed by a right transmetatarsal amputation 08/11/2020.  Fortunately the TMA has not healed well.  He is here to discuss options.  He has a left below-knee amputation which he has not ambulated on yet.  Past Medical History:  Diagnosis Date  . Anemia   . Arthritis    past hx   . Blindness    right eye r/t diabetes per wife [name]  . Cardiorenal syndrome   . Cataract    removed both eyes  . CHF (congestive heart failure) (HCC)    hx  . CKD (chronic kidney disease)    Dialysis T Th Sat  . Dehydration   . Diabetes ([place])    type 2 - diet controlled, no meds  . Glaucoma   . History of CVA (cerebrovascular accident) 09/13/2015  . History of stroke 09/13/2015  . History of urinary retention   . HOH (hard of hearing)    no hearing aids  . Hyperlipidemia   . Hypertension   . NSTEMI (non-ST elevated myocardial infarction) ([place])   . Peripheral vascular disease ([place])   . Pernicious anemia 02/24/2018  . S/P TURP   . Stroke [facility])    2017- March, no deficit   . Syncope 11/2019  . Tachycardia 08/26/2017  . Tubular adenoma of colon 02/2017  . Walker as ambulation aid    and occasional uses cane  . Weight loss, non-intentional 08/26/2017   10 lbs between 6/18 & 2/19    Past Surgical History:  Procedure Laterality Date  . ABDOMINAL AORTOGRAM W/LOWER EXTREMITY N/A 02/09/2020   Procedure: ABDOMINAL AORTOGRAM W/LOWER EXTREMITY;   Surgeon: Serafina Mitchell, MD;  Location: [place] [facility];  Service: Cardiovascular;  Laterality: N/A;  . ABDOMINAL AORTOGRAM W/LOWER EXTREMITY N/A 05/10/2020   Procedure: ABDOMINAL AORTOGRAM W/LOWER EXTREMITY;  Surgeon: Serafina Mitchell, MD;  Location: [place] [facility];  Service: Cardiovascular;  Laterality: N/A;  . ABDOMINAL AORTOGRAM W/LOWER EXTREMITY N/A 08/05/2020   Procedure: ABDOMINAL AORTOGRAM W/LOWER EXTREMITY;  Surgeon: Angelia Mould, MD;  Location: [place] [facility];  Service: Cardiovascular;  Laterality: N/A;  . AMPUTATION Left 03/31/2020   Procedure: LEFT BELOW KNEE AMPUTATION;  Surgeon: Serafina Mitchell, MD;  Location: [facility];  Service: Vascular;  Laterality: Left;  MAC anesthesia with regional block   WILL NEED DIALYSIS THURSDAY  . AV FISTULA PLACEMENT Right 01/19/2020   Procedure: BRACHIOCEPHALIC ARTERIOVENOUS (AV) FISTULA CREATION;  Surgeon: Waynetta Sandy, MD;  Location: [place];  Service: Vascular;  Laterality: Right;  . BUBBLE STUDY  08/08/2020   Procedure: BUBBLE STUDY;  Surgeon: Elouise Munroe, MD;  Location: [place];  Service: Cardiology;;  . CATARACT EXTRACTION, BILATERAL    . COLONOSCOPY    . ESOPHAGOGASTRODUODENOSCOPY (EGD) WITH PROPOFOL N/A 08/02/2020   Procedure: ESOPHAGOGASTRODUODENOSCOPY (EGD) WITH PROPOFOL;  Surgeon: Irene Shipper, MD;  Location: [facility] ENDOSCOPY;  Service: Endoscopy;  Laterality:  N/A;  . IR FLUORO GUIDE CV LINE RIGHT  01/14/2020  . IR THORACENTESIS ASP PLEURAL SPACE W/IMG GUIDE  09/21/2019  . IR THORACENTESIS ASP PLEURAL SPACE W/IMG GUIDE  10/16/2019  . IR US GUIDE VASC ACCESS RIGHT  01/15/2020  . LEFT HEART CATH AND CORONARY ANGIOGRAPHY N/A 11/19/2019   Procedure: LEFT HEART CATH AND CORONARY ANGIOGRAPHY;  Surgeon: Jettie Booze, MD;  Location: [place] [facility];  Service: Cardiovascular;  Laterality: N/A;  . PERIPHERAL VASCULAR BALLOON ANGIOPLASTY Right 05/10/2020   Procedure: PERIPHERAL VASCULAR BALLOON ANGIOPLASTY;   Surgeon: Serafina Mitchell, MD;  Location: [place] [facility];  Service: Cardiovascular;  Laterality: Right;  PT  . PERIPHERAL VASCULAR INTERVENTION Left 02/09/2020   Procedure: PERIPHERAL VASCULAR INTERVENTION;  Surgeon: Serafina Mitchell, MD;  Location: [place] [facility];  Service: Cardiovascular;  Laterality: Left;  . PERIPHERAL VASCULAR INTERVENTION Right 08/05/2020   Procedure: PERIPHERAL VASCULAR INTERVENTION;  Surgeon: Angelia Mould, MD;  Location: [place] [facility];  Service: Cardiovascular;  Laterality: Right;  . POLYPECTOMY    . REFRACTIVE SURGERY  10/2017  . TEE WITHOUT CARDIOVERSION N/A 09/14/2015   Procedure: TRANSESOPHAGEAL ECHOCARDIOGRAM (TEE);  Surgeon: Larey Dresser, MD;  Location: [place];  Service: Cardiovascular;  Laterality: N/A;  . TEE WITHOUT CARDIOVERSION N/A 08/08/2020   Procedure: TRANSESOPHAGEAL ECHOCARDIOGRAM (TEE);  Surgeon: Elouise Munroe, MD;  Location: [place];  Service: Cardiology;  Laterality: N/A;  . TRANSMETATARSAL AMPUTATION Right 08/11/2020   Procedure: RIGHT TRANSMETATARSAL AMPUTATION;  Surgeon: Waynetta Sandy, MD;  Location: [place];  Service: Vascular;  Laterality: Right;  . TRANSURETHRAL RESECTION OF PROSTATE N/A 10/20/2019   Procedure: TRANSURETHRAL RESECTION OF THE PROSTATE (TURP);  Surgeon: Irine Seal, MD;  Location: [facility];  Service: Urology;  Laterality: N/A;  . UPPER GASTROINTESTINAL ENDOSCOPY      Family History  Problem Relation Age of Onset  . Hypertension Mother   . Hyperlipidemia Mother   . Hyperlipidemia Father   . Colon cancer Neg Hx   . Colon polyps Neg Hx   . Esophageal cancer Neg Hx   . Rectal cancer Neg Hx   . Stomach cancer Neg Hx     Social History   Socioeconomic History  . Marital status: Married    Spouse name: Not on file  . Number of children: Not on file  . Years of education: Not on file  . Highest education level: Not on file  Occupational History  . Not on file  Tobacco Use  .  Smoking status: Former Smoker    Types: Cigarettes  . Smokeless tobacco: Former Systems developer    Types: Chew    Quit date: 07/09/1978  . Tobacco comment: quit yrs ago  Vaping Use  . Vaping Use: Never used  Substance and Sexual Activity  . Alcohol use: Not Currently    Alcohol/week: 1.0 standard drink    Types: 1 Cans of beer per week    Comment: quit last march/2017  . Drug use: No  . Sexual activity: Not on file  Other Topics Concern  . Not on file  Social History Narrative  . Not on file   Social Determinants of Health   Financial Resource Strain: Not on file  Food Insecurity: Not on file  Transportation Needs: Not on file  Physical Activity: Not on file  Stress: Not on file  Social Connections: Not on file  Intimate Partner Violence: Not on file    Allergies  Allergen Reactions  .  Cefepime Other (See Comments)    Pt had BAD encephalopathy from Cefepime    Current Outpatient Medications  Medication Sig Dispense Refill  . Accu-Chek FastClix Lancets MISC Check blood sugar up to 7 times a week as instructed 102 each 3  . acetaminophen (TYLENOL) 500 MG tablet Take 500 mg by mouth every 6 (six) hours as needed for moderate pain or headache.    Marland Kitchen aspirin EC 81 MG tablet Take 1 tablet (81 mg total) by mouth daily.    Marland Kitchen atorvastatin (LIPITOR) 80 MG tablet Take 1 tablet (80 mg total) by mouth at bedtime. IM program 90 tablet 3  . Blood Glucose Monitoring Suppl (ACCU-CHEK GUIDE) w/Device KIT 1 each by Does not apply route daily. Check blood sugar as instructed up to 7 times a week 1 kit 0  . clopidogrel (PLAVIX) 75 MG tablet Take 1 tablet (75 mg total) by mouth daily. 30 tablet 11  . Darbepoetin Alfa (ARANESP) 40 MCG/0.4ML SOSY injection Inject 0.4 mLs (40 mcg total) into the vein every Saturday with hemodialysis. 8.4 mL 0  . dorzolamide-timolol (COSOPT) 22.3-6.8 MG/ML ophthalmic solution Place 1 drop into the right eye 2 (two) times daily. 10 mL 10  . finasteride (PROSCAR) 5 MG tablet  Take 1 tablet (5 mg total) by mouth daily. 90 tablet 1  . glucose blood (ACCU-CHEK GUIDE) test strip Check blood sugar up to 7 times a week as instructed 100 each 3  . lidocaine-prilocaine (EMLA) cream Apply 1 application topically daily as needed (prior to fistula use).    . mirtazapine (REMERON) 15 MG tablet Take 1 tablet (15 mg total) by mouth at bedtime. 30 tablet 0  . pantoprazole (PROTONIX) 40 MG tablet Take 1 tablet (40 mg total) by mouth daily. 30 tablet 2  . polyethylene glycol (MIRALAX / GLYCOLAX) 17 g packet Take 17 g by mouth daily as needed for moderate constipation.     . sevelamer carbonate (RENVELA) 800 MG tablet Take 800 mg by mouth See admin instructions. Take 800 mg by mouth three times a day with food on Sun/Mon/Wed/Fri and two times a day on Tues/Thurs/Sat    . vitamin B-12 (CYANOCOBALAMIN) 1000 MCG tablet Take 1 tablet (1,000 mcg total) by mouth daily. 30 tablet 0   No current facility-administered medications for this visit.    REVIEW OF SYSTEMS:  _0  denotes positive finding, _1  denotes negative finding Cardiac  Comments:  Chest pain or chest pressure:    Shortness of breath upon exertion:    Short of breath when lying flat:    Irregular heart rhythm:        Vascular    Pain in calf, thigh, or hip brought on by ambulation:    Pain in feet at night that wakes you up from your sleep:     Blood clot in your veins:    Leg swelling:         Pulmonary    Oxygen at home:    Productive cough:     Wheezing:         Neurologic    Sudden weakness in arms or legs:     Sudden numbness in arms or legs:     Sudden onset of difficulty speaking or slurred speech:    Temporary loss of vision in one eye:     Problems with dizziness:         Gastrointestinal    Blood in stool:     Vomited blood:  Genitourinary    Burning when urinating:     Blood in urine:        Psychiatric    Major depression:         Hematologic    Bleeding problems:    Problems with  blood clotting too easily:        Skin    Rashes or ulcers:        Constitutional    Fever or chills:     PHYSICAL EXAM:   Vitals:   09/06/20 0818  BP: (!) 97/50  Pulse: 82  Resp: 20  Temp: 97.9 F (36.6 C)  SpO2: 95%  Weight: 132 lb (59.9 kg)  Height: 6' (1.829 m)   Constitutional: Elderly, chronically ill appearing in no distress. Appears marginally nourished.  Neurologic: CN intact. no focal findings. no sensory loss. Psychiatric: Mood and affect symmetric and appropriate. Eyes: No icterus. No conjunctival pallor. Ears, nose, throat: mucous membranes moist. Midline trachea.  Cardiac: regular rate and rhythm.  Respiratory: unlabored. Abdominal: soft, non-tender, non-distended.  Peripheral vascular:  R TMA not healing. Ischemic eschar across dorsal flap. Extremity: No edema. No cyanosis. No pallor.  Skin: No gangrene. No ulceration.  Lymphatic: No Stemmer's sign. No palpable lymphadenopathy.   DATA REVIEW:    Most recent CBC CBC Latest Ref Rng & Units 08/25/2020 08/20/2020 08/19/2020  WBC 4.0 - 10.5 K/uL 15.4(H) 14.5(H) 15.6(H)  Hemoglobin 13.0 - 17.0 g/dL 9.5(L) 10.0(L) 10.1(L)  Hematocrit 39.0 - 52.0 % 30.5(L) 32.7(L) 31.5(L)  Platelets 150 - 400 K/uL 449(H) 327 363     Most recent CMP CMP Latest Ref Rng & Units 08/25/2020 08/20/2020 08/19/2020  Glucose 70 - 99 mg/dL 197(H) 196(H) 194(H)  BUN 8 - 23 mg/dL 18 21 27(H)  Creatinine 0.61 - 1.24 mg/dL 4.25(H) 3.65(H) 4.70(H)  Sodium 135 - 145 mmol/L 137 139 137  Potassium 3.5 - 5.1 mmol/L 3.4(L) 3.7 4.1  Chloride 98 - 111 mmol/L 93(L) 94(L) 94(L)  CO2 22 - 32 mmol/L _0 Calcium 8.9 - 10.3 mg/dL 8.7(L) 8.8(L) 8.4(L)  Total Protein 6.5 - 8.1 g/dL - - -  Total Bilirubin 0.3 - 1.2 mg/dL - - -  Alkaline Phos 38 - 126 U/L - - -  AST 15 - 41 U/L - - -  ALT 0 - 44 U/L - - -    Renal function Estimated Creatinine Clearance: 13.9 mL/min (A) (by C-G formula based on SCr of 4.25 mg/dL (H)).  Hgb A1c MFr Bld (%)   Date Value  02/18/2020 6.4 (H)    LDL Chol Calc (NIH)  Date Value Ref Range Status  10/14/2019 49 0 - 99 mg/dL Final   LDL Cholesterol  Date Value Ref Range Status  02/18/2020 61 0 - 99 mg/dL Final    Comment:           Total Cholesterol/HDL:CHD Risk Coronary Heart Disease Risk Table                     Men   Women  1/2 Average Risk   3.4   3.3  Average Risk       5.0   4.4  2 X Average Risk   9.6   7.1  3 X Average Risk  23.4   11.0        Use the calculated Patient Ratio above and the CHD Risk Table to determine the patient's CHD Risk.        ATP  III CLASSIFICATION (LDL):  <100     mg/dL   Optimal  100-129  mg/dL   Near or Above                    Optimal  130-159  mg/dL   Borderline  160-189  mg/dL   High  >190     mg/dL   Very High Performed at [facility] [address], [place], [place] [address]     [name]. Stanford Breed, MD [facility] Phone Number: [phone] 09/06/2020 8:33 AM

## 2020-09-07 NOTE — Progress Notes (Signed)
Surgical Instructions for Dennis Macias Date of Surgery 09/08/20  TO: Mardene Celeste, LPN Fax [phone]    [name] procedure is scheduled on Thursday, 09/08/20.  Report to [facility] Main Entrance "A" at 5:40 A.M., then check in with the Admitting office.  Call this number if you have problems the morning of surgery:  [phone]   If you have any questions prior to your surgery date call [phone]: Open Monday-Friday 8am-4pm    Remember:  Do not eat after midnight tonight (Wednesday 09/07/20).     Take these medicines the morning of surgery with A SIP OF WATER: Tylenol if needed Finasteride Protonix zoloft Eye drops  As of today, STOP taking any Aspirin (unless otherwise instructed by your surgeon) Aleve, Naproxen, Ibuprofen, Motrin, Advil, Goody's, BC's, all herbal medications, fish oil, and all vitamins.                     Do not wear jewelry            Do not wear lotions, powders,colognes, or deodorant.            Men may shave face and neck.            Do not bring valuables to the hospital.            [facility] is not responsible for any belongings or valuables.  Do NOT Smoke (Tobacco/Vaping) or drink Alcohol 24 hours prior to your procedure If you use a CPAP at night, you may bring all equipment for your overnight stay.   Contacts, glasses, dentures or bridgework may not be worn into surgery, please bring cases for these belongings   For patients admitted to the hospital, discharge time will be determined by your treatment team.    Oral Hygiene is also important to reduce your risk of infection.  Remember - BRUSH YOUR TEETH THE MORNING OF SURGERY WITH YOUR REGULAR TOOTHPASTE   Day of Surgery: Wear Clean/Comfortable clothing the morning of surgery Do not apply any deodorants/lotions.   Remember to brush your teeth WITH YOUR REGULAR TOOTHPASTE.

## 2020-09-07 NOTE — Progress Notes (Signed)
Last dose of ASA and Plavix was on 09/07/20 per Mardene Celeste, LPN at [facility].

## 2020-09-07 NOTE — Anesthesia Preprocedure Evaluation (Addendum)
Anesthesia Evaluation  Patient identified by MRN, date of birth, ID band Patient awake    Reviewed: Allergy & Precautions, NPO status , Patient's Chart, lab work & pertinent test results  Airway Mallampati: II  TM Distance: >3 FB     Dental   Pulmonary former smoker,    breath sounds clear to auscultation       Cardiovascular hypertension, + CAD, + Past MI, + Peripheral Vascular Disease and +CHF   Rhythm:Regular Rate:Normal     Neuro/Psych  Neuromuscular disease CVA    GI/Hepatic Neg liver ROS, GERD  ,  Endo/Other  diabetes  Renal/GU Renal disease     Musculoskeletal   Abdominal   Peds  Hematology  (+) anemia ,   Anesthesia Other Findings   Reproductive/Obstetrics                            Anesthesia Physical Anesthesia Plan  ASA: III  Anesthesia Plan: General   Post-op Pain Management:    Induction: Intravenous  PONV Risk Score and Plan: 2 and Ondansetron, Dexamethasone and Midazolam  Airway Management Planned: Oral ETT  Additional Equipment:   Intra-op Plan:   Post-operative Plan: Extubation in OR  Informed Consent: I have reviewed the patients History and Physical, chart, labs and discussed the procedure including the risks, benefits and alternatives for the proposed anesthesia with the patient or authorized representative who has indicated his/her understanding and acceptance.   Patient has DNR.  Continue DNR and Discussed DNR with patient.   Dental advisory given  Plan Discussed with: CRNA and Anesthesiologist  Anesthesia Plan Comments: (PAT note written 09/07/2020 by Myra Gianotti, PA-C. )      Anesthesia Quick Evaluation

## 2020-09-07 NOTE — Progress Notes (Addendum)
Anesthesia Chart Review: Dennis Macias   Case: [id number] Date/Time: 09/08/20 0825   Procedure: RIGHT AMPUTATION BELOW KNEE (Right Knee)   Anesthesia type: General   Pre-op diagnosis: Non healing TMA   Location: [facility] OR ROOM 12 / [place] [facility]   Surgeons: Cherre Robins, MD      DISCUSSION: Patient is a 70 year old male scheduled for the above procedure. He is s/p right transmetatarsal amputation (TMA) on 08/11/20), but site has not healed well and BKA recommended at follow-up visit with Dennis Macias on 09/06/20. Plavix placed on hold until after surgery at his 09/06/20 visit.   History includes former smoker (quit 07/09/78), HTN, HLD, CAD (NSTEMI 11/2019, troponins 8657>8469, felt likely type 2 NSTEMI due to infection, decompensated CHF with BNP > 4000; LHC 11/19/19 revealed "non-obstructive CAD" but with 80% LAD, medical therapy recommended), CHF,  PAD (s/p left PT artery atherectomy/angioplasty 02/09/20; s/p left BKA 03/31/20; right PT artery angioplasty 05/10/20 & 08/05/20; s/p right TMA 2/322), DM2, CVA (09/12/15; 02/17/20, per neurology "Given the pattern of these strokes in a watershed distribution and the fact that the patient had a syncopal episode, his strokes are likely due to hypotension post dialysis or orthostatic hypotension."; 07/29/20 also with syncope presentation after HD), anemia, ESRD (started HD 01/15/20; s/p right brachiocephalic AVF 01/05/51), hard of hearing, right eye blindness, glaucoma, BPH (s/p TURP 10/20/19).    - ED visit 08/25/20 after having brief right sided chest pain at dialysis. Chest pain felt atypical. No chest pain once in ED. CXR unremarkable. EKG and troponin trends felt stable. Discharged with return precautions.  - ED visit 08/20/20 for fall out of his wheelchair while waiting on a ride after dialysis. Unsure if he had true syncope, but had at least pre-syncopal feeling. Again thought to be related to post-HD hypotension. No acute findings of head and neck CT. - Admission 07/28/20-08/19/20.  Presented with syncopal event while sitting in his wheelchair while waiting for a ride after hemodialysis. SBP while sitting was in the 80's and syncope felt secondary to post-HD hypotension. However, also noted to have a marked drop in his HGB (11.3-->7.4), worsening right toe ulcerations with MSSA bacteremia, and head CT showed acute ischemic left thalamic infarct (already on DPAT due to PVD). GI, vascular surgery, and nephrology consulted. Cardiology was consulted for TEE which showed normal biventricular function, no LA thrombus, no valvular vegetations, with incomplete assessment of atrial level shunt (inadequate bubble study, but no shunt by color flow). He had already had recent carotid US (02/2020) and event monitor (01/2020). DPAT continued (already on for PAD). S/p 4 units PRBC (total). 08/02/20 EGD revealed noneroisve esophagitis and PPI prescribed. Vascular surgeon performed right PT angioplasty 08/05/20, but he ultimately required right TMA 08/11/20 for his non-healing toe wounds. He was discharged to SNF [facility]   Continued medical therapy for CAD with one year follow-up recommended at 03/11/20 visit with Dr. Irish Lack.  He is a same day work-up, so he is for labs and anesthesia team evaluation on the day of surgery. He is for COVID-19 test on arrival.    VS:  BP Readings from Last 3 Encounters:  09/06/20 (!) 97/50  08/25/20 117/66  08/20/20 133/62   Pulse Readings from Last 3 Encounters:  09/06/20 82  08/25/20 79  08/20/20 64    PROVIDERS: [name], MD is PCP Larae Grooms, MD is cardiologist. Last office visit 03/11/20 with one year follow-up recommended.  Antony Contras, MD is neurologist Madelon Lips, MD  is nephrologist   LABS: Labs for day of procedure. Lab Results  Component Value Date   WBC 15.4 (H) 08/25/2020   HGB 9.5 (L) 08/25/2020   HCT 30.5 (L) 08/25/2020   PLT 449 (H) 08/25/2020   GLUCOSE 197 (H) 08/25/2020   ALT <5 08/11/2020   AST 17  08/11/2020   NA 137 08/25/2020   K 3.4 (L) 08/25/2020   CL 93 (L) 08/25/2020   CREATININE 4.25 (H) 08/25/2020   BUN 18 08/25/2020   CO2 27 08/25/2020   INR 1.5 (H) 08/11/2020   HGBA1C 6.4 (H) 02/18/2020    IMAGES: CXR 09/04/20: FINDINGS: Trachea midline. Cardiomediastinal contours and hilar structures are normal. Lungs are clear. On limited assessment no acute skeletal process. IMPRESSION: No active cardiopulmonary disease.  MRI Brain 07/29/20: IMPRESSION: 1. 4 mm acute ischemic nonhemorrhagic left thalamic infarct. 2. Moderately age-related cerebral atrophy with advanced chronic microvascular ischemic disease. 3. Multiple chronic micro hemorrhages scattered throughout both cerebral hemispheres, favored to be related to chronic poorly controlled hypertension.   EKG: 08/25/20: Sinus rhythm Short PR interval RBBB and LAFB Left ventricular hypertrophy No significant change since last tracing Confirmed by Isla Pence [phone]) on 08/25/2020 6:07:18 PM   CV: TEE 08/08/20: IMPRESSIONS  1. Left ventricular ejection fraction, by estimation, is 60 to 65%. The  left ventricle has normal function. The left ventricle has no regional  wall motion abnormalities.  2. Right ventricular systolic function is normal. The right ventricular  size is normal.  3. Left atrial size was mildly dilated. No left atrial/left atrial  appendage thrombus was detected.  4. The mitral valve is normal in structure. Trivial mitral valve  regurgitation. No evidence of mitral stenosis.  5. The aortic valve is normal in structure. Aortic valve regurgitation is  not visualized. No aortic stenosis is present.  6. Aortic dilatation noted. There is borderline dilatation of the aortic  root, measuring 38 mm. There is Moderate (Grade III) plaque involving the  transverse and descending aorta.  7. Inadequate agitated saline injection to assess for interatrial shunt.  [Incomplete assessment for atrial  level shunt. No shunt by color flow. Inadequate bubble study.]  - Conclusion(s)/Recommendation(s): No evidence of vegetation/infective  endocarditis on this transesophageal  echocardiogram.    Carotid duplex 02/18/20: Summary:  Right Carotid: Velocities in the right ICA are consistent with a 1-39% stenosis.  Left Carotid: Velocities in the left ICA are consistent with a 1-39% stenosis.  Vertebrals: Bilateral vertebral arteries demonstrate antegrade flow.  Subclavians: Normal flow hemodynamics were seen in bilateral subclavian arteries.    Cardiac event monitor 12/30/19-01/28/20: Study Highlights  Normal sinus rhythm with rare PACs and PVCs.  No atrial fibrillation. No pathologic arrhythmias.   LHC 11/19/19 (done following high risk stress test 10/17/19: LV apex infarct with peri-infarct ischemia involving anterior and inferior wall, EF 40%):  Mid LAD lesion is 75% stenosed.  Ost LAD to Prox LAD lesion is 80% stenosed. In the cranial views, it is seen that the disease extends up to the ostial LAD, and there is significant calcification.  LV end diastolic pressure is normal.  There is no aortic valve stenosis.  PCI of the proximal - ostial LAD would likely require IVUS with possible atherectomy. There is a mid to distal LAD lesion which is eccentric.   Given his lack of angina and issues with renal insufficiency, would not pursue PCI at this time. Would reconsider based on his renal function.    Past Medical History:  Diagnosis  Date  . Anemia   . Arthritis    past hx   . Blindness    right eye r/t diabetes per wife [name]  . Cardiorenal syndrome   . Cataract    removed both eyes  . CHF (congestive heart failure) (HCC)    hx  . CKD (chronic kidney disease)    Dialysis T Th Sat  . Dehydration   . Diabetes ([place])    type 2 - diet controlled, no meds  . Glaucoma   . History of CVA (cerebrovascular accident) 09/13/2015  . History of stroke 09/13/2015  . History of  urinary retention   . HOH (hard of hearing)    no hearing aids  . Hyperlipidemia   . Hypertension   . NSTEMI (non-ST elevated myocardial infarction) ([place])   . Peripheral vascular disease ([place])   . Pernicious anemia 02/24/2018  . S/P TURP   . Stroke [facility])    2017- March, no deficit   . Syncope 11/2019  . Tachycardia 08/26/2017  . Tubular adenoma of colon 02/2017  . Walker as ambulation aid    and occasional uses cane  . Weight loss, non-intentional 08/26/2017   10 lbs between 6/18 & 2/19    Past Surgical History:  Procedure Laterality Date  . ABDOMINAL AORTOGRAM W/LOWER EXTREMITY N/A 02/09/2020   Procedure: ABDOMINAL AORTOGRAM W/LOWER EXTREMITY;  Surgeon: Serafina Mitchell, MD;  Location: [place] CV LAB;  Service: Cardiovascular;  Laterality: N/A;  . ABDOMINAL AORTOGRAM W/LOWER EXTREMITY N/A 05/10/2020   Procedure: ABDOMINAL AORTOGRAM W/LOWER EXTREMITY;  Surgeon: Serafina Mitchell, MD;  Location: [place] CV LAB;  Service: Cardiovascular;  Laterality: N/A;  . ABDOMINAL AORTOGRAM W/LOWER EXTREMITY N/A 08/05/2020   Procedure: ABDOMINAL AORTOGRAM W/LOWER EXTREMITY;  Surgeon: Angelia Mould, MD;  Location: [place] CV LAB;  Service: Cardiovascular;  Laterality: N/A;  . AMPUTATION Left 03/31/2020   Procedure: LEFT BELOW KNEE AMPUTATION;  Surgeon: Serafina Mitchell, MD;  Location: [facility] OR;  Service: Vascular;  Laterality: Left;  MAC anesthesia with regional block   WILL NEED DIALYSIS THURSDAY  . AV FISTULA PLACEMENT Right 01/19/2020   Procedure: BRACHIOCEPHALIC ARTERIOVENOUS (AV) FISTULA CREATION;  Surgeon: Waynetta Sandy, MD;  Location: [place];  Service: Vascular;  Laterality: Right;  . BUBBLE STUDY  08/08/2020   Procedure: BUBBLE STUDY;  Surgeon: Elouise Munroe, MD;  Location: [place];  Service: Cardiology;;  . CATARACT EXTRACTION, BILATERAL    . COLONOSCOPY    . ESOPHAGOGASTRODUODENOSCOPY (EGD) WITH PROPOFOL N/A 08/02/2020   Procedure:  ESOPHAGOGASTRODUODENOSCOPY (EGD) WITH PROPOFOL;  Surgeon: Irene Shipper, MD;  Location: [facility] ENDOSCOPY;  Service: Endoscopy;  Laterality: N/A;  . IR FLUORO GUIDE CV LINE RIGHT  01/14/2020  . IR THORACENTESIS ASP PLEURAL SPACE W/IMG GUIDE  09/21/2019  . IR THORACENTESIS ASP PLEURAL SPACE W/IMG GUIDE  10/16/2019  . IR US GUIDE VASC ACCESS RIGHT  01/15/2020  . LEFT HEART CATH AND CORONARY ANGIOGRAPHY N/A 11/19/2019   Procedure: LEFT HEART CATH AND CORONARY ANGIOGRAPHY;  Surgeon: Jettie Booze, MD;  Location: [place] CV LAB;  Service: Cardiovascular;  Laterality: N/A;  . PERIPHERAL VASCULAR BALLOON ANGIOPLASTY Right 05/10/2020   Procedure: PERIPHERAL VASCULAR BALLOON ANGIOPLASTY;  Surgeon: Serafina Mitchell, MD;  Location: [place] CV LAB;  Service: Cardiovascular;  Laterality: Right;  PT  . PERIPHERAL VASCULAR INTERVENTION Left 02/09/2020   Procedure: PERIPHERAL VASCULAR INTERVENTION;  Surgeon: Serafina Mitchell, MD;  Location: [place] CV LAB;  Service: Cardiovascular;  Laterality: Left;  . PERIPHERAL VASCULAR INTERVENTION Right 08/05/2020   Procedure: PERIPHERAL VASCULAR INTERVENTION;  Surgeon: Angelia Mould, MD;  Location: [place] CV LAB;  Service: Cardiovascular;  Laterality: Right;  . POLYPECTOMY    . REFRACTIVE SURGERY  10/2017  . TEE WITHOUT CARDIOVERSION N/A 09/14/2015   Procedure: TRANSESOPHAGEAL ECHOCARDIOGRAM (TEE);  Surgeon: Larey Dresser, MD;  Location: [place];  Service: Cardiovascular;  Laterality: N/A;  . TEE WITHOUT CARDIOVERSION N/A 08/08/2020   Procedure: TRANSESOPHAGEAL ECHOCARDIOGRAM (TEE);  Surgeon: Elouise Munroe, MD;  Location: [place];  Service: Cardiology;  Laterality: N/A;  . TRANSMETATARSAL AMPUTATION Right 08/11/2020   Procedure: RIGHT TRANSMETATARSAL AMPUTATION;  Surgeon: Waynetta Sandy, MD;  Location: [place];  Service: Vascular;  Laterality: Right;  . TRANSURETHRAL RESECTION OF PROSTATE N/A 10/20/2019   Procedure: TRANSURETHRAL  RESECTION OF THE PROSTATE (TURP);  Surgeon: Irine Seal, MD;  Location: [facility];  Service: Urology;  Laterality: N/A;  . UPPER GASTROINTESTINAL ENDOSCOPY      MEDICATIONS: No current facility-administered medications for this encounter.   Marland Kitchen acetaminophen (TYLENOL) 500 MG tablet  . aspirin EC 81 MG tablet  . atorvastatin (LIPITOR) 80 MG tablet  . clopidogrel (PLAVIX) 75 MG tablet  . dorzolamide-timolol (COSOPT) 22.3-6.8 MG/ML ophthalmic solution  . finasteride (PROSCAR) 5 MG tablet  . lidocaine-prilocaine (EMLA) cream  . pantoprazole (PROTONIX) 40 MG tablet  . polyethylene glycol (MIRALAX / GLYCOLAX) 17 g packet  . sertraline (ZOLOFT) 25 MG tablet  . sevelamer carbonate (RENVELA) 800 MG tablet  . vitamin B-12 (CYANOCOBALAMIN) 500 MCG tablet  . Accu-Chek FastClix Lancets MISC  . Blood Glucose Monitoring Suppl (ACCU-CHEK GUIDE) w/Device KIT  . Darbepoetin Alfa (ARANESP) 40 MCG/0.4ML SOSY injection  . glucose blood (ACCU-CHEK GUIDE) test strip  . mirtazapine (REMERON) 15 MG tablet    Myra Gianotti, PA-C Surgical Short Stay/Anesthesiology [facility] Phone [phone] [facility] Phone [phone] 09/07/2020 10:42 AM

## 2020-09-07 NOTE — Progress Notes (Signed)
Faxed [phone]) instructions for DOS to Nurse Mardene Celeste, LPN at [facility] (phone [phone]).    PCP - Dr [name] Cardiologist - Dr Larae Grooms Neuro - Dr Antony Contras Nephrologist - Dr Madelon Lips  Chest x-ray - 08/25/20 (2V) EKG - 08/26/20 Stress Test - 10/17/19 ECHO TEE - 08/08/20 Cardiac Cath - 11/19/19  Type 2 Diabetic - no meds  Blood Thinner Instructions:  Follow your surgeon's instructions on when to stop plavix prior to surgery.  Aspirin Instructions: Follow your surgeon's instructions on when to stop aspirin prior to surgery,  If no instructions were given by your surgeon then you will need to call the office for those instructions.  Anesthesia review: Yes  STOP now taking any Aspirin (unless otherwise instructed by your surgeon), Aleve, Naproxen, Ibuprofen, Motrin, Advil, Goody's, BC's, all herbal medications, fish oil, and all vitamins.   Coronavirus Screening Covid test is scheduled on DOS

## 2020-09-08 ENCOUNTER — Inpatient Hospital Stay (HOSPITAL_COMMUNITY): Payer: Medicare PPO | Admitting: Vascular Surgery

## 2020-09-08 ENCOUNTER — Inpatient Hospital Stay (HOSPITAL_COMMUNITY)
Admission: RE | Admit: 2020-09-08 | Discharge: 2020-09-13 | DRG: 239 | Disposition: A | Discharge status: Skilled Nursing Facility | Payer: Medicare PPO | Attending: Internal Medicine | Admitting: Internal Medicine

## 2020-09-08 ENCOUNTER — Encounter (HOSPITAL_COMMUNITY)
Admission: RE | Disposition: A | Discharge status: Skilled Nursing Facility | Payer: Self-pay | Source: Home / Self Care | Attending: Internal Medicine

## 2020-09-08 ENCOUNTER — Other Ambulatory Visit: Payer: Self-pay

## 2020-09-08 ENCOUNTER — Encounter (HOSPITAL_COMMUNITY): Payer: Self-pay | Admitting: Vascular Surgery

## 2020-09-08 ENCOUNTER — Telehealth: Payer: Self-pay

## 2020-09-08 DIAGNOSIS — E43 Unspecified severe protein-calorie malnutrition: Secondary | ICD-10-CM | POA: Diagnosis not present

## 2020-09-08 DIAGNOSIS — Z992 Dependence on renal dialysis: Secondary | ICD-10-CM

## 2020-09-08 DIAGNOSIS — I132 Hypertensive heart and chronic kidney disease with heart failure and with stage 5 chronic kidney disease, or end stage renal disease: Secondary | ICD-10-CM | POA: Diagnosis not present

## 2020-09-08 DIAGNOSIS — R279 Unspecified lack of coordination: Secondary | ICD-10-CM | POA: Diagnosis not present

## 2020-09-08 DIAGNOSIS — H409 Unspecified glaucoma: Secondary | ICD-10-CM | POA: Diagnosis present

## 2020-09-08 DIAGNOSIS — Z79899 Other long term (current) drug therapy: Secondary | ICD-10-CM

## 2020-09-08 DIAGNOSIS — Z8673 Personal history of transient ischemic attack (TIA), and cerebral infarction without residual deficits: Secondary | ICD-10-CM | POA: Diagnosis not present

## 2020-09-08 DIAGNOSIS — E1122 Type 2 diabetes mellitus with diabetic chronic kidney disease: Secondary | ICD-10-CM | POA: Diagnosis present

## 2020-09-08 DIAGNOSIS — I5022 Chronic systolic (congestive) heart failure: Secondary | ICD-10-CM | POA: Diagnosis not present

## 2020-09-08 DIAGNOSIS — L97519 Non-pressure chronic ulcer of other part of right foot with unspecified severity: Secondary | ICD-10-CM | POA: Diagnosis present

## 2020-09-08 DIAGNOSIS — E11621 Type 2 diabetes mellitus with foot ulcer: Secondary | ICD-10-CM | POA: Diagnosis present

## 2020-09-08 DIAGNOSIS — D62 Acute posthemorrhagic anemia: Secondary | ICD-10-CM | POA: Diagnosis not present

## 2020-09-08 DIAGNOSIS — Z89519 Acquired absence of unspecified leg below knee: Secondary | ICD-10-CM | POA: Diagnosis not present

## 2020-09-08 DIAGNOSIS — Z7401 Bed confinement status: Secondary | ICD-10-CM

## 2020-09-08 DIAGNOSIS — I255 Ischemic cardiomyopathy: Secondary | ICD-10-CM | POA: Diagnosis present

## 2020-09-08 DIAGNOSIS — Z20822 Contact with and (suspected) exposure to covid-19: Secondary | ICD-10-CM | POA: Diagnosis not present

## 2020-09-08 DIAGNOSIS — Z89511 Acquired absence of right leg below knee: Secondary | ICD-10-CM | POA: Diagnosis not present

## 2020-09-08 DIAGNOSIS — N186 End stage renal disease: Secondary | ICD-10-CM | POA: Diagnosis present

## 2020-09-08 DIAGNOSIS — E11319 Type 2 diabetes mellitus with unspecified diabetic retinopathy without macular edema: Secondary | ICD-10-CM

## 2020-09-08 DIAGNOSIS — Z681 Body mass index (BMI) 19 or less, adult: Secondary | ICD-10-CM

## 2020-09-08 DIAGNOSIS — E1151 Type 2 diabetes mellitus with diabetic peripheral angiopathy without gangrene: Principal | ICD-10-CM | POA: Diagnosis present

## 2020-09-08 DIAGNOSIS — D510 Vitamin B12 deficiency anemia due to intrinsic factor deficiency: Secondary | ICD-10-CM | POA: Diagnosis present

## 2020-09-08 DIAGNOSIS — I70261 Atherosclerosis of native arteries of extremities with gangrene, right leg: Secondary | ICD-10-CM | POA: Diagnosis not present

## 2020-09-08 DIAGNOSIS — N400 Enlarged prostate without lower urinary tract symptoms: Secondary | ICD-10-CM | POA: Diagnosis present

## 2020-09-08 DIAGNOSIS — Z743 Need for continuous supervision: Secondary | ICD-10-CM | POA: Diagnosis not present

## 2020-09-08 DIAGNOSIS — R5381 Other malaise: Secondary | ICD-10-CM | POA: Diagnosis not present

## 2020-09-08 DIAGNOSIS — D619 Aplastic anemia, unspecified: Secondary | ICD-10-CM | POA: Diagnosis not present

## 2020-09-08 DIAGNOSIS — I70203 Unspecified atherosclerosis of native arteries of extremities, bilateral legs: Secondary | ICD-10-CM | POA: Diagnosis present

## 2020-09-08 DIAGNOSIS — E8779 Other fluid overload: Secondary | ICD-10-CM | POA: Diagnosis not present

## 2020-09-08 DIAGNOSIS — I5043 Acute on chronic combined systolic (congestive) and diastolic (congestive) heart failure: Secondary | ICD-10-CM | POA: Diagnosis not present

## 2020-09-08 DIAGNOSIS — Z89512 Acquired absence of left leg below knee: Secondary | ICD-10-CM

## 2020-09-08 DIAGNOSIS — E876 Hypokalemia: Secondary | ICD-10-CM | POA: Diagnosis present

## 2020-09-08 DIAGNOSIS — N2581 Secondary hyperparathyroidism of renal origin: Secondary | ICD-10-CM | POA: Diagnosis present

## 2020-09-08 DIAGNOSIS — Z9079 Acquired absence of other genital organ(s): Secondary | ICD-10-CM

## 2020-09-08 DIAGNOSIS — I2510 Atherosclerotic heart disease of native coronary artery without angina pectoris: Secondary | ICD-10-CM | POA: Diagnosis present

## 2020-09-08 DIAGNOSIS — E785 Hyperlipidemia, unspecified: Secondary | ICD-10-CM | POA: Diagnosis not present

## 2020-09-08 DIAGNOSIS — D631 Anemia in chronic kidney disease: Secondary | ICD-10-CM | POA: Diagnosis not present

## 2020-09-08 DIAGNOSIS — Z87891 Personal history of nicotine dependence: Secondary | ICD-10-CM

## 2020-09-08 DIAGNOSIS — I5042 Chronic combined systolic (congestive) and diastolic (congestive) heart failure: Secondary | ICD-10-CM | POA: Diagnosis not present

## 2020-09-08 DIAGNOSIS — Z83438 Family history of other disorder of lipoprotein metabolism and other lipidemia: Secondary | ICD-10-CM

## 2020-09-08 DIAGNOSIS — F32A Depression, unspecified: Secondary | ICD-10-CM | POA: Diagnosis present

## 2020-09-08 DIAGNOSIS — Z8249 Family history of ischemic heart disease and other diseases of the circulatory system: Secondary | ICD-10-CM

## 2020-09-08 DIAGNOSIS — I10 Essential (primary) hypertension: Secondary | ICD-10-CM | POA: Diagnosis not present

## 2020-09-08 DIAGNOSIS — I252 Old myocardial infarction: Secondary | ICD-10-CM

## 2020-09-08 DIAGNOSIS — T8781 Dehiscence of amputation stump: Secondary | ICD-10-CM | POA: Diagnosis not present

## 2020-09-08 DIAGNOSIS — Z8619 Personal history of other infectious and parasitic diseases: Secondary | ICD-10-CM

## 2020-09-08 DIAGNOSIS — Z7982 Long term (current) use of aspirin: Secondary | ICD-10-CM

## 2020-09-08 DIAGNOSIS — T8189XA Other complications of procedures, not elsewhere classified, initial encounter: Secondary | ICD-10-CM | POA: Diagnosis not present

## 2020-09-08 DIAGNOSIS — Z7902 Long term (current) use of antithrombotics/antiplatelets: Secondary | ICD-10-CM

## 2020-09-08 HISTORY — PX: BELOW KNEE LEG AMPUTATION: SUR23

## 2020-09-08 HISTORY — PX: AMPUTATION: SHX166

## 2020-09-08 LAB — COMPREHENSIVE METABOLIC PANEL
ALT: 6 U/L (ref 0–44)
AST: 14 U/L — ABNORMAL LOW (ref 15–41)
Albumin: 2.5 g/dL — ABNORMAL LOW (ref 3.5–5.0)
Alkaline Phosphatase: 72 U/L (ref 38–126)
Anion gap: 15 (ref 5–15)
BUN: 30 mg/dL — ABNORMAL HIGH (ref 8–23)
CO2: 29 mmol/L (ref 22–32)
Calcium: 9 mg/dL (ref 8.9–10.3)
Chloride: 92 mmol/L — ABNORMAL LOW (ref 98–111)
Creatinine, Ser: 5.35 mg/dL — ABNORMAL HIGH (ref 0.61–1.24)
GFR, Estimated: 11 mL/min — ABNORMAL LOW (ref >60)
Glucose, Bld: 220 mg/dL — ABNORMAL HIGH (ref 70–99)
Potassium: 3 mmol/L — ABNORMAL LOW (ref 3.5–5.1)
Sodium: 136 mmol/L (ref 135–145)
Total Bilirubin: 0.6 mg/dL (ref 0.3–1.2)
Total Protein: 6.9 g/dL (ref 6.5–8.1)

## 2020-09-08 LAB — CBC
HCT: 28.9 % — ABNORMAL LOW (ref 39.0–52.0)
Hemoglobin: 8.9 g/dL — ABNORMAL LOW (ref 13.0–17.0)
MCH: 29.3 pg (ref 26.0–34.0)
MCHC: 30.8 g/dL (ref 30.0–36.0)
MCV: 95.1 fL (ref 80.0–100.0)
Platelets: 384 10*3/uL (ref 150–400)
RBC: 3.04 MIL/uL — ABNORMAL LOW (ref 4.22–5.81)
RDW: 15.2 % (ref 11.5–15.5)
WBC: 9.7 10*3/uL (ref 4.0–10.5)
nRBC: 0.2 % (ref 0.0–0.2)

## 2020-09-08 LAB — APTT: aPTT: 31 seconds (ref 24–36)

## 2020-09-08 LAB — GLUCOSE, CAPILLARY
Glucose-Capillary: 160 mg/dL — ABNORMAL HIGH (ref 70–99)
Glucose-Capillary: 180 mg/dL — ABNORMAL HIGH (ref 70–99)
Glucose-Capillary: 184 mg/dL — ABNORMAL HIGH (ref 70–99)
Glucose-Capillary: 202 mg/dL — ABNORMAL HIGH (ref 70–99)
Glucose-Capillary: 240 mg/dL — ABNORMAL HIGH (ref 70–99)

## 2020-09-08 LAB — PROTIME-INR
INR: 1.4 — ABNORMAL HIGH (ref 0.8–1.2)
Prothrombin Time: 16.8 seconds — ABNORMAL HIGH (ref 11.4–15.2)

## 2020-09-08 LAB — SARS CORONAVIRUS 2 BY RT PCR (HOSPITAL ORDER, PERFORMED IN ~~LOC~~ HOSPITAL LAB): SARS Coronavirus 2: NEGATIVE

## 2020-09-08 SURGERY — AMPUTATION BELOW KNEE
Anesthesia: General | Site: Knee | Laterality: Right

## 2020-09-08 MED ORDER — INSULIN ASPART 100 UNIT/ML ~~LOC~~ SOLN
0.0000 [IU] | Freq: Three times a day (TID) | SUBCUTANEOUS | Status: DC
  Administered 2020-09-08 – 2020-09-09 (×2): 1 [IU] via SUBCUTANEOUS
  Administered 2020-09-09: 2 [IU] via SUBCUTANEOUS
  Administered 2020-09-10 (×2): 1 [IU] via SUBCUTANEOUS

## 2020-09-08 MED ORDER — ALTEPLASE 2 MG IJ SOLR
2.0000 mg | Freq: Once | INTRAMUSCULAR | Status: DC | PRN
  Filled 2020-09-08: qty 2

## 2020-09-08 MED ORDER — PHENOL 1.4 % MT LIQD: 1.0000 | OROMUCOSAL | Status: DC | PRN

## 2020-09-08 MED ORDER — SERTRALINE HCL 25 MG PO TABS
25.0000 mg | ORAL_TABLET | Freq: Every day | ORAL | Status: DC
  Administered 2020-09-09 – 2020-09-13 (×5): 25 mg via ORAL
  Filled 2020-09-08 (×5): qty 1

## 2020-09-08 MED ORDER — PROPOFOL 10 MG/ML IV BOLUS
INTRAVENOUS | Status: AC
  Filled 2020-09-08: qty 20

## 2020-09-08 MED ORDER — ATORVASTATIN CALCIUM 80 MG PO TABS
80.0000 mg | ORAL_TABLET | Freq: Every day | ORAL | Status: DC
  Administered 2020-09-08 – 2020-09-13 (×6): 80 mg via ORAL
  Filled 2020-09-08 (×6): qty 1

## 2020-09-08 MED ORDER — PANTOPRAZOLE SODIUM 40 MG PO TBEC
40.0000 mg | DELAYED_RELEASE_TABLET | Freq: Every day | ORAL | Status: DC
  Administered 2020-09-08 – 2020-09-13 (×6): 40 mg via ORAL
  Filled 2020-09-08 (×6): qty 1

## 2020-09-08 MED ORDER — VANCOMYCIN HCL IN DEXTROSE 1-5 GM/200ML-% IV SOLN
1000.0000 mg | INTRAVENOUS | Status: AC
  Administered 2020-09-08: 1000 mg via INTRAVENOUS
  Filled 2020-09-08: qty 200

## 2020-09-08 MED ORDER — FENTANYL CITRATE (PF) 250 MCG/5ML IJ SOLN
INTRAMUSCULAR | Status: AC
  Filled 2020-09-08: qty 5

## 2020-09-08 MED ORDER — FENTANYL CITRATE (PF) 250 MCG/5ML IJ SOLN
INTRAMUSCULAR | Status: DC | PRN
  Administered 2020-09-08: 100 ug via INTRAVENOUS
  Administered 2020-09-08: 50 ug via INTRAVENOUS

## 2020-09-08 MED ORDER — CHLORHEXIDINE GLUCONATE CLOTH 2 % EX PADS: 6.0000 | MEDICATED_PAD | Freq: Once | CUTANEOUS | Status: DC

## 2020-09-08 MED ORDER — HYDROMORPHONE HCL 1 MG/ML IJ SOLN: 1.0000 mg | INTRAMUSCULAR | Status: DC | PRN

## 2020-09-08 MED ORDER — HEPARIN SODIUM (PORCINE) 1000 UNIT/ML DIALYSIS
1000.0000 [IU] | INTRAMUSCULAR | Status: DC | PRN
  Filled 2020-09-08: qty 1

## 2020-09-08 MED ORDER — VANCOMYCIN HCL 1000 MG/200ML IV SOLN
1000.0000 mg | Freq: Two times a day (BID) | INTRAVENOUS | Status: AC
  Administered 2020-09-08 – 2020-09-09 (×2): 1000 mg via INTRAVENOUS
  Filled 2020-09-08 (×2): qty 200

## 2020-09-08 MED ORDER — SUGAMMADEX SODIUM 200 MG/2ML IV SOLN
INTRAVENOUS | Status: DC | PRN
  Administered 2020-09-08 (×2): 100 mg via INTRAVENOUS

## 2020-09-08 MED ORDER — DOCUSATE SODIUM 100 MG PO CAPS
100.0000 mg | ORAL_CAPSULE | Freq: Every day | ORAL | Status: DC
  Administered 2020-09-09 – 2020-09-13 (×5): 100 mg via ORAL
  Filled 2020-09-08 (×5): qty 1

## 2020-09-08 MED ORDER — CLOPIDOGREL BISULFATE 75 MG PO TABS
75.0000 mg | ORAL_TABLET | Freq: Every day | ORAL | Status: DC
  Administered 2020-09-08 – 2020-09-13 (×6): 75 mg via ORAL
  Filled 2020-09-08 (×6): qty 1

## 2020-09-08 MED ORDER — FENTANYL CITRATE (PF) 100 MCG/2ML IJ SOLN
25.0000 ug | INTRAMUSCULAR | Status: DC | PRN
Start: 2020-09-08 — End: 2020-09-08
  Administered 2020-09-08 (×3): 25 ug via INTRAVENOUS

## 2020-09-08 MED ORDER — SEVELAMER CARBONATE 800 MG PO TABS
800.0000 mg | ORAL_TABLET | Freq: Three times a day (TID) | ORAL | Status: DC
  Administered 2020-09-09 – 2020-09-13 (×12): 800 mg via ORAL
  Filled 2020-09-08 (×13): qty 1

## 2020-09-08 MED ORDER — LIDOCAINE HCL (PF) 1 % IJ SOLN
5.0000 mL | INTRAMUSCULAR | Status: DC | PRN
  Filled 2020-09-08: qty 5

## 2020-09-08 MED ORDER — EPHEDRINE SULFATE-NACL 50-0.9 MG/10ML-% IV SOSY
PREFILLED_SYRINGE | INTRAVENOUS | Status: DC | PRN
  Administered 2020-09-08: 10 mg via INTRAVENOUS

## 2020-09-08 MED ORDER — LIDOCAINE-PRILOCAINE 2.5-2.5 % EX CREA
1.0000 "application " | TOPICAL_CREAM | CUTANEOUS | Status: DC | PRN
  Filled 2020-09-08: qty 5

## 2020-09-08 MED ORDER — PHENYLEPHRINE 40 MCG/ML (10ML) SYRINGE FOR IV PUSH (FOR BLOOD PRESSURE SUPPORT)
PREFILLED_SYRINGE | INTRAVENOUS | Status: DC | PRN
  Administered 2020-09-08 (×2): 120 ug via INTRAVENOUS

## 2020-09-08 MED ORDER — LIDOCAINE 2% (20 MG/ML) 5 ML SYRINGE
INTRAMUSCULAR | Status: AC
  Filled 2020-09-08: qty 5

## 2020-09-08 MED ORDER — PROPOFOL 10 MG/ML IV BOLUS
INTRAVENOUS | Status: DC | PRN
  Administered 2020-09-08: 150 mg via INTRAVENOUS

## 2020-09-08 MED ORDER — HYDROMORPHONE HCL 1 MG/ML IJ SOLN
0.5000 mg | INTRAMUSCULAR | Status: DC | PRN
  Administered 2020-09-09: 0.5 mg via INTRAVENOUS

## 2020-09-08 MED ORDER — ONDANSETRON HCL 4 MG/2ML IJ SOLN
INTRAMUSCULAR | Status: AC
  Filled 2020-09-08: qty 2

## 2020-09-08 MED ORDER — FINASTERIDE 5 MG PO TABS
5.0000 mg | ORAL_TABLET | Freq: Every day | ORAL | Status: DC
Start: 2020-09-08 — End: 2020-09-14
  Administered 2020-09-09 – 2020-09-13 (×5): 5 mg via ORAL
  Filled 2020-09-08 (×5): qty 1

## 2020-09-08 MED ORDER — ACETAMINOPHEN 325 MG PO TABS
650.0000 mg | ORAL_TABLET | Freq: Four times a day (QID) | ORAL | Status: DC | PRN
  Filled 2020-09-08: qty 2

## 2020-09-08 MED ORDER — 0.9 % SODIUM CHLORIDE (POUR BTL) OPTIME
TOPICAL | Status: DC | PRN
  Administered 2020-09-08: 1000 mL

## 2020-09-08 MED ORDER — POTASSIUM CHLORIDE CRYS ER 20 MEQ PO TBCR: 20.0000 meq | EXTENDED_RELEASE_TABLET | Freq: Every day | ORAL | Status: DC | PRN

## 2020-09-08 MED ORDER — PENTAFLUOROPROP-TETRAFLUOROETH EX AERO
1.0000 "application " | INHALATION_SPRAY | CUTANEOUS | Status: DC | PRN
  Filled 2020-09-08: qty 116

## 2020-09-08 MED ORDER — MIDAZOLAM HCL 2 MG/2ML IJ SOLN
INTRAMUSCULAR | Status: AC
  Filled 2020-09-08: qty 2

## 2020-09-08 MED ORDER — ONDANSETRON HCL 4 MG/2ML IJ SOLN: 4.0000 mg | Freq: Four times a day (QID) | INTRAMUSCULAR | Status: DC | PRN

## 2020-09-08 MED ORDER — MAGNESIUM SULFATE 2 GM/50ML IV SOLN
2.0000 g | Freq: Every day | INTRAVENOUS | Status: DC | PRN
  Filled 2020-09-08: qty 50

## 2020-09-08 MED ORDER — PHENYLEPHRINE 40 MCG/ML (10ML) SYRINGE FOR IV PUSH (FOR BLOOD PRESSURE SUPPORT)
PREFILLED_SYRINGE | INTRAVENOUS | Status: AC
  Filled 2020-09-08: qty 10

## 2020-09-08 MED ORDER — ONDANSETRON HCL 4 MG/2ML IJ SOLN
INTRAMUSCULAR | Status: DC | PRN
  Administered 2020-09-08: 4 mg via INTRAVENOUS

## 2020-09-08 MED ORDER — ALBUMIN HUMAN 5 % IV SOLN: INTRAVENOUS | Status: DC | PRN

## 2020-09-08 MED ORDER — SODIUM CHLORIDE 0.9 % IV SOLN: 125.0000 mg | INTRAVENOUS | Status: DC

## 2020-09-08 MED ORDER — BACITRACIN ZINC 500 UNIT/GM EX OINT
TOPICAL_OINTMENT | CUTANEOUS | Status: AC
  Filled 2020-09-08: qty 28.35

## 2020-09-08 MED ORDER — ACETAMINOPHEN 650 MG RE SUPP: 650.0000 mg | Freq: Four times a day (QID) | RECTAL | Status: DC | PRN

## 2020-09-08 MED ORDER — ROCURONIUM BROMIDE 10 MG/ML (PF) SYRINGE
PREFILLED_SYRINGE | INTRAVENOUS | Status: DC | PRN
  Administered 2020-09-08: 50 mg via INTRAVENOUS

## 2020-09-08 MED ORDER — SODIUM CHLORIDE 0.9 % IV SOLN: 100.0000 mL | INTRAVENOUS | Status: DC | PRN

## 2020-09-08 MED ORDER — HEPARIN SODIUM (PORCINE) 5000 UNIT/ML IJ SOLN
5000.0000 [IU] | Freq: Three times a day (TID) | INTRAMUSCULAR | Status: DC
  Administered 2020-09-08 – 2020-09-13 (×14): 5000 [IU] via SUBCUTANEOUS
  Filled 2020-09-08 (×14): qty 1

## 2020-09-08 MED ORDER — OXYCODONE-ACETAMINOPHEN 5-325 MG PO TABS
1.0000 | ORAL_TABLET | ORAL | Status: DC | PRN
  Administered 2020-09-08 – 2020-09-10 (×3): 1 via ORAL
  Administered 2020-09-11: 2 via ORAL
  Administered 2020-09-11: 1 via ORAL
  Administered 2020-09-11: 2 via ORAL
  Administered 2020-09-11: 1 via ORAL
  Administered 2020-09-11 – 2020-09-13 (×4): 2 via ORAL
  Filled 2020-09-08 (×2): qty 2
  Filled 2020-09-08 (×2): qty 1
  Filled 2020-09-08 (×2): qty 2
  Filled 2020-09-08: qty 1
  Filled 2020-09-08: qty 2
  Filled 2020-09-08 (×3): qty 1
  Filled 2020-09-08: qty 2

## 2020-09-08 MED ORDER — CHLORHEXIDINE GLUCONATE 0.12 % MT SOLN
OROMUCOSAL | Status: AC
  Filled 2020-09-08: qty 15

## 2020-09-08 MED ORDER — SODIUM CHLORIDE 0.9 % IV SOLN: 125.0000 mg | Freq: Once | INTRAVENOUS | Status: DC

## 2020-09-08 MED ORDER — FENTANYL CITRATE (PF) 100 MCG/2ML IJ SOLN
INTRAMUSCULAR | Status: AC
  Administered 2020-09-08: 25 ug via INTRAVENOUS
  Filled 2020-09-08: qty 2

## 2020-09-08 MED ORDER — HYDROMORPHONE HCL 1 MG/ML IJ SOLN
1.0000 mg | INTRAMUSCULAR | Status: DC | PRN
  Administered 2020-09-08 – 2020-09-12 (×4): 1 mg via INTRAVENOUS
  Filled 2020-09-08 (×4): qty 1

## 2020-09-08 MED ORDER — ASPIRIN EC 81 MG PO TBEC
81.0000 mg | DELAYED_RELEASE_TABLET | Freq: Every day | ORAL | Status: DC
  Administered 2020-09-08 – 2020-09-13 (×6): 81 mg via ORAL
  Filled 2020-09-08 (×6): qty 1

## 2020-09-08 MED ORDER — EPHEDRINE 5 MG/ML INJ
INTRAVENOUS | Status: AC
  Filled 2020-09-08: qty 10

## 2020-09-08 MED ORDER — LIDOCAINE 2% (20 MG/ML) 5 ML SYRINGE
INTRAMUSCULAR | Status: DC | PRN
  Administered 2020-09-08: 80 mg via INTRAVENOUS

## 2020-09-08 MED ORDER — ROCURONIUM BROMIDE 10 MG/ML (PF) SYRINGE
PREFILLED_SYRINGE | INTRAVENOUS | Status: AC
  Filled 2020-09-08: qty 10

## 2020-09-08 MED ORDER — SODIUM CHLORIDE 0.9 % IV SOLN: INTRAVENOUS | Status: DC

## 2020-09-08 SURGICAL SUPPLY — 56 items
BLADE LONG MED 31X9 (MISCELLANEOUS) IMPLANT
BLADE SAGITTAL (BLADE)
BLADE SAGITTAL 25.0X1.19X90 (BLADE) ×2 IMPLANT
BLADE SAW GIGLI 510 (BLADE) IMPLANT
BLADE SAW THK.89X75X18XSGTL (BLADE) IMPLANT
BLADE SURG 21 STRL SS (BLADE) ×2 IMPLANT
BNDG COHESIVE 6X5 TAN STRL LF (GAUZE/BANDAGES/DRESSINGS) ×2 IMPLANT
BNDG ELASTIC 4X5.8 VLCR STR LF (GAUZE/BANDAGES/DRESSINGS) ×2 IMPLANT
BNDG ELASTIC 6X5.8 VLCR STR LF (GAUZE/BANDAGES/DRESSINGS) ×2 IMPLANT
BNDG ESMARK 4X9 LF (GAUZE/BANDAGES/DRESSINGS) ×2 IMPLANT
BNDG GAUZE ELAST 4 BULKY (GAUZE/BANDAGES/DRESSINGS) ×2 IMPLANT
CANISTER SUCT 3000ML PPV (MISCELLANEOUS) ×2 IMPLANT
CHLORAPREP W/TINT 26 (MISCELLANEOUS) ×2 IMPLANT
CLIP VESOCCLUDE MED 6/CT (CLIP) IMPLANT
COVER SURGICAL LIGHT HANDLE (MISCELLANEOUS) ×2 IMPLANT
COVER WAND RF STERILE (DRAPES) IMPLANT
CUFF TOURN SGL QUICK 18X4 (TOURNIQUET CUFF) ×2 IMPLANT
DRAIN CHANNEL 19F RND (DRAIN) IMPLANT
DRAPE HALF SHEET 40X57 (DRAPES) ×2 IMPLANT
DRAPE ORTHO SPLIT 77X108 STRL (DRAPES) ×4
DRAPE SURG ORHT 6 SPLT 77X108 (DRAPES) ×2 IMPLANT
ELECT REM PT RETURN 9FT ADLT (ELECTROSURGICAL) ×2
ELECTRODE REM PT RTRN 9FT ADLT (ELECTROSURGICAL) ×1 IMPLANT
EVACUATOR SILICONE 100CC (DRAIN) IMPLANT
GAUZE SPONGE 4X4 12PLY STRL (GAUZE/BANDAGES/DRESSINGS) ×2 IMPLANT
GAUZE XEROFORM 5X9 LF (GAUZE/BANDAGES/DRESSINGS) ×2 IMPLANT
GLOVE SURG SS PI 8.0 STRL IVOR (GLOVE) ×2 IMPLANT
GOWN STRL REUS W/ TWL LRG LVL3 (GOWN DISPOSABLE) ×2 IMPLANT
GOWN STRL REUS W/ TWL XL LVL3 (GOWN DISPOSABLE) ×2 IMPLANT
GOWN STRL REUS W/TWL LRG LVL3 (GOWN DISPOSABLE) ×4
GOWN STRL REUS W/TWL XL LVL3 (GOWN DISPOSABLE) ×4
KIT BASIN OR (CUSTOM PROCEDURE TRAY) ×2 IMPLANT
KIT TURNOVER KIT B (KITS) ×2 IMPLANT
NS IRRIG 1000ML POUR BTL (IV SOLUTION) ×2 IMPLANT
PACK GENERAL/GYN (CUSTOM PROCEDURE TRAY) ×2 IMPLANT
PAD ARMBOARD 7.5X6 YLW CONV (MISCELLANEOUS) ×4 IMPLANT
PENCIL SMOKE EVACUATOR (MISCELLANEOUS) ×2 IMPLANT
STAPLER SKIN 35 REG (STAPLE) IMPLANT
STAPLER VISISTAT 35W (STAPLE) ×2 IMPLANT
STOCKINETTE IMPERVIOUS LG (DRAPES) ×2 IMPLANT
SUT BONE WAX W31G (SUTURE) IMPLANT
SUT ETHILON 2 0 PSLX (SUTURE) ×4 IMPLANT
SUT ETHILON 3 0 PS 1 (SUTURE) IMPLANT
SUT PROLENE 4 0 RB 1 (SUTURE) ×2
SUT PROLENE 4-0 RB1 .5 CRCL 36 (SUTURE) ×1 IMPLANT
SUT SILK 0 TIES 10X30 (SUTURE) IMPLANT
SUT SILK 2 0 (SUTURE)
SUT SILK 2 0 SH CR/8 (SUTURE) ×6 IMPLANT
SUT SILK 2 0 TIES 10X30 (SUTURE) ×2 IMPLANT
SUT SILK 2-0 18XBRD TIE 12 (SUTURE) IMPLANT
SUT SILK 3 0 (SUTURE)
SUT SILK 3-0 18XBRD TIE 12 (SUTURE) IMPLANT
SUT VIC AB 2-0 CT1 18 (SUTURE) ×6 IMPLANT
TOWEL GREEN STERILE (TOWEL DISPOSABLE) ×4 IMPLANT
UNDERPAD 30X36 HEAVY ABSORB (UNDERPADS AND DIAPERS) ×2 IMPLANT
WATER STERILE IRR 1000ML POUR (IV SOLUTION) ×2 IMPLANT

## 2020-09-08 NOTE — Transfer of Care (Signed)
Immediate Anesthesia Transfer of Care Note  Patient: Dennis Macias  Procedure(s) Performed: RIGHT AMPUTATION BELOW KNEE (Right Knee)  Patient Location: PACU  Anesthesia Type:General  Level of Consciousness: awake, alert  and oriented  Airway & Oxygen Therapy: Patient Spontanous Breathing  Post-op Assessment: Report given to RN and Post -op Vital signs reviewed and stable  Post vital signs: Reviewed and stable  Last Vitals:  Vitals Value Taken Time  BP 144/61 09/08/20 1042  Temp    Pulse 69 09/08/20 1043  Resp 12 09/08/20 1043  SpO2 100 % 09/08/20 1043  Vitals shown include unvalidated device data.  Last Pain:  Vitals:   09/08/20 0656  TempSrc:   PainSc: 0-No pain      Patients Stated Pain Goal: 0 (02/20/47 1856)  Complications: No complications documented.

## 2020-09-08 NOTE — Hospital Course (Addendum)
Admitted 09/08/2020  Allergies: Cefepime Pertinent Hx: ESRD on HD TTS, type 2 DM, CVA, HTN, HFrEF, PVD with recent nonhealing TMA   70 y.o. male p/w planned BKA  * PVD s/p BKA: Holding ASA and plavix until I speak with vascular, vascular following. Will need PT/OT  *ESRD: Nephro consulted, dialysis per them  Consults: Vascular, nephrology  Meds: Lipitor, dilaudid, DAPT VTE ppx: Heparin IVF: None Diet: Renal   Hx: 08/11/20 TMA nonhealing, BKA 09/08/20

## 2020-09-08 NOTE — Interval H&P Note (Signed)
History and Physical Interval Note:  09/08/2020 8:11 AM  Dennis Macias  has presented today for surgery, with the diagnosis of Non healing TMA.  The various methods of treatment have been discussed with the patient and family. After consideration of risks, benefits and other options for treatment, the patient has consented to  Procedure(s): RIGHT AMPUTATION BELOW KNEE (Right) as a surgical intervention.  The patient's history has been reviewed, patient examined, no change in status, stable for surgery.  I have reviewed the patient's chart and labs.  Questions were answered to the patient's satisfaction.     Cherre Robins

## 2020-09-08 NOTE — Addendum Note (Signed)
Addendum  created 09/08/20 1129 by Jacinta Shoe, PA-C   Clinical Note Signed

## 2020-09-08 NOTE — H&P (Signed)
Date: 09/08/2020               Patient Name:  Dennis Macias MRN: [id number]  DOB: 26-May-1951 Age / Sex: 70 y.o., male   PCP: [name], MD         Medical Service: Internal Medicine Teaching Service         Attending Physician: Dr. Sid Falcon, MD    First Contact: Dr. Konrad Penta Pager: [phone]  Second Contact: Dr. Marianna Payment Pager: [phone]       After Hours (After 5p/  First Contact Pager: [phone]  weekends / holidays): Second Contact Pager: [phone]   Chief Complaint: Right below knee amputation  History of Present Illness: This is a 70 year old male with history of ESRD on hemodialysis, type 2 diabetes, CVA, hypertension, recent history of MSSA bacteremia: Heart failure with reduced EF, and peripheral vascular disease on dual antiplatelet therapy who had recently had right lower extremity ulceration and tissue loss of right TMA incision, had a planned right BKA due to non healing TMA by vascular surgery today.  Contacted by vascular surgery for admission due to his multiple comorbidities.  Patient currently resides at [facility] care center.  He reports pain in his right lower leg.  States that he has no other complaints, he denies any nausea, vomiting, fevers, chills, chest pain, shortness of breath, headaches, lightheadedness, dizziness, fatigue, weakness, or other symptoms.  He last went to dialysis on Tuesday, he denies any issues with his dialysis sessions.  Patient is currently afebrile, vitals are stable, on 2 L cannula.  Labs are significant for potassium 3.0, glucose 221, bicarb 29.  Hemoglobin 8.9, baseline around 9-10.  Covid test on 3/3 was negative.   Meds:  Current Meds  Medication Sig  . aspirin EC 81 MG tablet Take 1 tablet (81 mg total) by mouth daily.  Marland Kitchen atorvastatin (LIPITOR) 80 MG tablet Take 1 tablet (80 mg total) by mouth at bedtime. IM program  . clopidogrel (PLAVIX) 75 MG tablet Take 1 tablet (75 mg total) by mouth daily.  . dorzolamide-timolol  (COSOPT) 22.3-6.8 MG/ML ophthalmic solution Place 1 drop into the right eye 2 (two) times daily.  . finasteride (PROSCAR) 5 MG tablet Take 1 tablet (5 mg total) by mouth daily.  Marland Kitchen lidocaine-prilocaine (EMLA) cream Apply 1 application topically daily as needed (prior to fistula use).  . pantoprazole (PROTONIX) 40 MG tablet Take 1 tablet (40 mg total) by mouth daily.  . sertraline (ZOLOFT) 25 MG tablet Take 25 mg by mouth daily.  . sevelamer carbonate (RENVELA) 800 MG tablet Take 800 mg by mouth See admin instructions. Take 800 mg by mouth three times a day with food on Sun/Mon/Wed/Fri and two times a day on Tues/Thurs/Sat  . vitamin B-12 (CYANOCOBALAMIN) 500 MCG tablet Take 500 mcg by mouth daily.   Allergies: Allergies as of 09/06/2020 - Review Complete 09/06/2020  Allergen Reaction Noted  . Cefepime Other (See Comments) 12/14/2019   Past Medical History:  Diagnosis Date  . Anemia   . Arthritis    past hx   . Blindness    right eye r/t diabetes per wife [name]  . Cardiorenal syndrome   . Cataract    removed both eyes  . CHF (congestive heart failure) (HCC)    hx  . CKD (chronic kidney disease)    Dialysis T Th Sat  . Dehydration   . Diabetes ([place])    type 2 - diet controlled, no meds  .  Glaucoma   . History of CVA (cerebrovascular accident) 09/13/2015  . History of stroke 09/13/2015  . History of urinary retention   . HOH (hard of hearing)    no hearing aids  . Hyperlipidemia   . Hypertension   . NSTEMI (non-ST elevated myocardial infarction) ([place])   . Peripheral vascular disease ([place])   . Pernicious anemia 02/24/2018  . S/P TURP   . Stroke [facility])    2017- March, no deficit   . Syncope 11/2019  . Tachycardia 08/26/2017  . Tubular adenoma of colon 02/2017  . Walker as ambulation aid    and occasional uses cane  . Weight loss, non-intentional 08/26/2017   10 lbs between 6/18 & 2/19    Family History:  Family History  Problem Relation Age of Onset  . Hypertension Mother    . Hyperlipidemia Mother   . Hyperlipidemia Father   . Colon cancer Neg Hx   . Colon polyps Neg Hx   . Esophageal cancer Neg Hx   . Rectal cancer Neg Hx   . Stomach cancer Neg Hx     Social History:  Social Connections: Not on file   Social History   Socioeconomic History  . Marital status: Married    Spouse name: Not on file  . Number of children: Not on file  . Years of education: Not on file  . Highest education level: Not on file  Occupational History  . Not on file  Tobacco Use  . Smoking status: Former Smoker    Types: Cigarettes  . Smokeless tobacco: Former Systems developer    Types: Chew    Quit date: 07/09/1978  . Tobacco comment: quit yrs ago  Vaping Use  . Vaping Use: Never used  Substance and Sexual Activity  . Alcohol use: Not Currently    Alcohol/week: 1.0 standard drink    Types: 1 Cans of beer per week    Comment: quit last march/2017  . Drug use: No  . Sexual activity: Not on file  Other Topics Concern  . Not on file  Social History Narrative  . Not on file   Social Determinants of Health   Financial Resource Strain: Not on file  Food Insecurity: Not on file  Transportation Needs: Not on file  Physical Activity: Not on file  Stress: Not on file  Social Connections: Not on file  Intimate Partner Violence: Not on file    Review of Systems: A complete ROS was negative except as per HPI.   Physical Exam: Blood pressure 115/62, pulse 68, temperature 97.8 F (36.6 C), resp. rate 14, height 6' (1.829 m), weight 59.9 kg, SpO2 100 %. Physical Exam Constitutional:      Comments: Chronically ill-appearing  HENT:     Head: Normocephalic and atraumatic.     Mouth/Throat:     Mouth: Mucous membranes are moist.     Pharynx: Oropharynx is clear.  Eyes:     Extraocular Movements: Extraocular movements intact.     Conjunctiva/sclera: Conjunctivae normal.     Pupils: Pupils are equal, round, and reactive to light.  Cardiovascular:     Rate and Rhythm: Normal  rate and regular rhythm.     Pulses: Normal pulses.     Heart sounds: Normal heart sounds. No murmur heard.   Pulmonary:     Effort: Pulmonary effort is normal. No respiratory distress.     Breath sounds: Normal breath sounds.  Abdominal:     General: Abdomen is flat. Bowel sounds  are normal.     Palpations: Abdomen is soft.     Tenderness: There is no abdominal tenderness.  Musculoskeletal:        General: Deformity (Right lower leg BKA with bandage in place. Left BKA, with well healed scar. ) present.     Comments: Right upper extremity fistula with thrill  Skin:    General: Skin is warm and dry.  Neurological:     Mental Status: He is alert and oriented to person, place, and time. Mental status is at baseline.  Psychiatric:        Mood and Affect: Mood normal.        Behavior: Behavior normal.    Assessment & Plan by Problem: Active Problems:   S/P BKA (below knee amputation) (HCC)  PVD of right lower extremity status post right TMA that was nonhealing status post right BKA: Patient admitted after scheduled right BKA by vascular surgery.  Reports that he has no other issues or concerns at this time. He reports some pain in his leg.  He received vancomycin preprocedure.  Currently has fentanyl ordered for pain control. Will hold on therapy evaluation pending vascular recommendations.   -Vascular surgery following, appreciate recommendations -Will plan on resuming aspirin and Plavix if no contraindications from vascular -Tylenol for mild pain -Dilaudid PRN for pain control  ESRD on HD TTS: Patient last received dialysis on 3/1 with no issues.  Patient due for scheduled dialysis today.  Patient does have some mild hypokalemia, potassium 3.0.  He is not volume overloaded.  He has no urgent need for dialysis at this time.  -Consulted nephrology for scheduled dialysis -Repeat BMP in a.m.  Type 2 diabetes: Patient is not on any medications at home at this time.  His glucose  today is 200-240. -Frequent CBGs -SSI sensitive  Hypertension: Hyperlipidemia: Patient has a history of hypertension, not on any home medications at this time.  On Lipitor 80 mg daily at home. Vitals have been stable.  -Resume home Lipitor 80 mg  HFrEF: Patient does not appear volume overloaded at this time.  Not currently on any medications.  We will continue to monitor for now.  Depression: Patient on sertraline 25 mg at home.  Resume this.  Dispo: Admit patient to Observation with expected length of stay less than 2 midnights.  Signed: [name], MD 09/08/2020, 2:10 PM  Pager: [phone] After 5pm on weekdays and 1pm on weekends: On Call pager: [phone]

## 2020-09-08 NOTE — Telephone Encounter (Signed)
Dr. Sherry Ruffing at [facility] called to ask if patient (BKA today) could restart ASA and Plavix - per [name] yes. MD aware. Also asked about starting PT and OT - advised to wait until tomorrow.

## 2020-09-08 NOTE — Anesthesia Postprocedure Evaluation (Signed)
Anesthesia Post Note  Patient: Dennis Macias  Procedure(s) Performed: RIGHT AMPUTATION BELOW KNEE (Right Knee)     Patient location during evaluation: PACU Anesthesia Type: General Level of consciousness: awake Pain management: pain level controlled Vital Signs Assessment: post-procedure vital signs reviewed and stable Respiratory status: spontaneous breathing Cardiovascular status: stable Postop Assessment: no apparent nausea or vomiting Anesthetic complications: no   No complications documented.  Last Vitals:  Vitals:   09/08/20 1058 09/08/20 1112  BP:  129/62  Pulse: 72 66  Resp: 12 12  Temp:    SpO2: 100% 100%    Last Pain:  Vitals:   09/08/20 1058  TempSrc:   PainSc: 0-No pain                 Aliany Fiorenza

## 2020-09-08 NOTE — Op Note (Signed)
DATE OF SERVICE: 09/08/2020  PATIENT:  Dennis Macias  70 y.o. male  PRE-OPERATIVE DIAGNOSIS:  Non-healing R TMA  POST-OPERATIVE DIAGNOSIS:  Same  PROCEDURE:   Right below knee amputation  SURGEON:  Surgeon(s) and Role:    * Cherre Robins, MD - Primary  ASSISTANT: Leontine Locket, PA-C  An assistant was required to facilitate exposure and expedite the case.  ANESTHESIA:   general  EBL: 156mL  BLOOD ADMINISTERED:none  DRAINS: none   LOCAL MEDICATIONS USED:  NONE  SPECIMEN:  Residual limb  COUNTS: confirmed correct.  TOURNIQUET: not functional during case  PATIENT DISPOSITION:  PACU - hemodynamically stable.   Delay start of Pharmacological VTE agent (>24hrs) due to surgical blood loss or risk of bleeding: no  INDICATION FOR PROCEDURE: Dennis Macias is a 70 y.o. male with non-healing R TMA. After careful discussion of risks, benefits, and alternatives the patient was offered right below knee amputation. The patient understood and wished to proceed.  OPERATIVE FINDINGS: grossly healthy margins. Heavily calcified tibial vessels.  DESCRIPTION OF PROCEDURE: After identification of the patient in the pre-operative holding area, the patient was transferred to the operating room. The patient was positioned supine on the operating room table. Anesthesia was induced. The right leg was prepped and draped in standard fashion. A surgical pause was performed confirming correct patient, procedure, and operative location.  A sterile tourniquet was placed on the right thigh. The skin of the leg was marked to plan the anterior incision 10 cm distal to the tibial tuberosity and then the marked out a posterior flap that was one third of the circumference of the calf in length.   I then exsanguinated the leg with a Esmarch bandage and then inflated the pneumatic tourniquet to 250 mm Hg. I made the incisions for these flaps, and then dissected through the subcutaneous tissue, fascia, and muscle  anteriorly with electrocautery.  I also similarly developed a thick posterior flap of muscle with electrocautery. The tourniquet did not achieve hemostasis. I elevated  the periosteal tissue superiorly so that the tibia was about 3 cm shorter than the anterior skin flap.  I then transected the tibia with a power saw and then took a wedge off the tibia anteriorly with the power saw.  Then I smoothed out the rough edges with a rasp.  In a similar fashion, I cut back the fibula about two centimeters higher than the level of the tibia with a bone cutter.  I then finished releasing the posterior muscle flap with electrocautery.  The tibial vessels were heavily calcified and quite difficult to suture ligate.  At this point, the specimen was passed off the field as the below-the-knee amputation.  At this point, I clamped all visibly bleeding arteries and veins using a combination of suture ligation with Vicryl and electrocautery.  The tourniquet was then deflated at this point.  Bleeding continued to be controlled with electrocautery and suture ligature.  The stump was washed off with sterile normal saline and no further active bleeding was noted.    I reapproximated the anterior and posterior fascia  with interrupted stitches of 2-0 Vicryl.  This was completed along the entire length of anterior and posterior fascia until there were no more loose space in the fascial line.  The skin was then reapproximated with staples.  The stump was washed off and dried.    The incision was dressed and an ACE wrap was applied.    Upon completion of the case instrument  and sharps counts were confirmed correct. The patient was transferred to the PACU in good condition. I was present for all portions of the procedure.  [name]. Stanford Breed, MD [facility] Phone Number: [phone] 09/08/2020 10:27 AM

## 2020-09-08 NOTE — Progress Notes (Signed)
Pt arrived to unit from PACU, alert/oriented in NAD, situated/orientated to room/equipments. Welcome guide/menu provided with instructions. Pt verbalizes understanding of instructions. Hospital valuables policy has been discussed with no complaints. Bed in lowest position with 3 side rails up, call bell.room phone within reach and all wheels locked.

## 2020-09-08 NOTE — Progress Notes (Signed)
[facility] Renal Consultation Note    Indication for Consultation:  Management of ESRD/hemodialysis; anemia, hypertension/volume and secondary hyperparathyroidism  EXN:[id number], Rylee, MD  HPI: Dennis Macias is a 70 y.o. male. He has ESRD 2/2 diabetic nephropathy. He is on HD TTS at [facility], first starting in July 2021.  His past medical history significant for HTN, CVA, CAD, ischemic CMP, and chronic systolic and diastolic heart failure.  Today, patient underwent R Below Knee Amputation d/t non-healing R TMA. Procedure performed by [name]. Last HD on 09/06/20 where it was noted patient received full treatment. Labs reviewed: K 3.0, BUN 30, and creat 5.25. Patient seen and examined at bedside in PACU. He reports pain at R stump. Otherwise, he has no other complaints. Denies SOB, CP, ABD pain, N/V/D. Vascular and Internal Medicine are following. Plan for patient to receive HD tomorrow 09/09/20.  Past Medical History:  Diagnosis Date  . Anemia   . Arthritis    past hx   . Blindness    right eye r/t diabetes per wife Dennis Macias  . Cardiorenal syndrome   . Cataract    removed both eyes  . CHF (congestive heart failure) (HCC)    hx  . CKD (chronic Macias disease)    Dialysis T Th Sat  . Dehydration   . Diabetes ([place])    type 2 - diet controlled, no meds  . Glaucoma   . History of CVA (cerebrovascular accident) 09/13/2015  . History of stroke 09/13/2015  . History of urinary retention   . HOH (hard of hearing)    no hearing aids  . Hyperlipidemia   . Hypertension   . NSTEMI (non-ST elevated myocardial infarction) ([place])   . Peripheral vascular disease ([place])   . Pernicious anemia 02/24/2018  . S/P TURP   . Stroke [facility])    2017- March, no deficit   . Syncope 11/2019  . Tachycardia 08/26/2017  . Tubular adenoma of colon 02/2017  . Walker as ambulation aid    and occasional uses cane  . Weight loss, non-intentional 08/26/2017   10 lbs between 6/18 & 2/19   Past  Surgical History:  Procedure Laterality Date  . ABDOMINAL AORTOGRAM W/LOWER EXTREMITY N/A 02/09/2020   Procedure: ABDOMINAL AORTOGRAM W/LOWER EXTREMITY;  Surgeon: Serafina Mitchell, MD;  Location: [place] [facility];  Service: Cardiovascular;  Laterality: N/A;  . ABDOMINAL AORTOGRAM W/LOWER EXTREMITY N/A 05/10/2020   Procedure: ABDOMINAL AORTOGRAM W/LOWER EXTREMITY;  Surgeon: Serafina Mitchell, MD;  Location: [place] [facility];  Service: Cardiovascular;  Laterality: N/A;  . ABDOMINAL AORTOGRAM W/LOWER EXTREMITY N/A 08/05/2020   Procedure: ABDOMINAL AORTOGRAM W/LOWER EXTREMITY;  Surgeon: Angelia Mould, MD;  Location: [place] CV [facility];  Service: Cardiovascular;  Laterality: N/A;  . AMPUTATION Left 03/31/2020   Procedure: LEFT BELOW KNEE AMPUTATION;  Surgeon: Serafina Mitchell, MD;  Location: [facility] OR;  Service: Vascular;  Laterality: Left;  MAC anesthesia with regional block   WILL NEED DIALYSIS THURSDAY  . AV FISTULA PLACEMENT Right 01/19/2020   Procedure: BRACHIOCEPHALIC ARTERIOVENOUS (AV) FISTULA CREATION;  Surgeon: Waynetta Sandy, MD;  Location: [place];  Service: Vascular;  Laterality: Right;  . BUBBLE STUDY  08/08/2020   Procedure: BUBBLE STUDY;  Surgeon: Elouise Munroe, MD;  Location: [place];  Service: Cardiology;;  . CATARACT EXTRACTION, BILATERAL    . COLONOSCOPY    . ESOPHAGOGASTRODUODENOSCOPY (EGD) WITH PROPOFOL N/A 08/02/2020   Procedure: ESOPHAGOGASTRODUODENOSCOPY (EGD) WITH PROPOFOL;  Surgeon: Henrene Pastor,  Docia Chuck, MD;  Location: [facility] ENDOSCOPY;  Service: Endoscopy;  Laterality: N/A;  . IR FLUORO GUIDE CV LINE RIGHT  01/14/2020  . IR THORACENTESIS ASP PLEURAL SPACE W/IMG GUIDE  09/21/2019  . IR THORACENTESIS ASP PLEURAL SPACE W/IMG GUIDE  10/16/2019  . IR US GUIDE VASC ACCESS RIGHT  01/15/2020  . LEFT HEART CATH AND CORONARY ANGIOGRAPHY N/A 11/19/2019   Procedure: LEFT HEART CATH AND CORONARY ANGIOGRAPHY;  Surgeon: Jettie Booze, MD;  Location: [place] CV LAB;   Service: Cardiovascular;  Laterality: N/A;  . PERIPHERAL VASCULAR BALLOON ANGIOPLASTY Right 05/10/2020   Procedure: PERIPHERAL VASCULAR BALLOON ANGIOPLASTY;  Surgeon: Serafina Mitchell, MD;  Location: [place] CV LAB;  Service: Cardiovascular;  Laterality: Right;  PT  . PERIPHERAL VASCULAR INTERVENTION Left 02/09/2020   Procedure: PERIPHERAL VASCULAR INTERVENTION;  Surgeon: Serafina Mitchell, MD;  Location: [place] CV LAB;  Service: Cardiovascular;  Laterality: Left;  . PERIPHERAL VASCULAR INTERVENTION Right 08/05/2020   Procedure: PERIPHERAL VASCULAR INTERVENTION;  Surgeon: Angelia Mould, MD;  Location: [place] CV LAB;  Service: Cardiovascular;  Laterality: Right;  . POLYPECTOMY    . REFRACTIVE SURGERY  10/2017  . TEE WITHOUT CARDIOVERSION N/A 09/14/2015   Procedure: TRANSESOPHAGEAL ECHOCARDIOGRAM (TEE);  Surgeon: Larey Dresser, MD;  Location: [place];  Service: Cardiovascular;  Laterality: N/A;  . TEE WITHOUT CARDIOVERSION N/A 08/08/2020   Procedure: TRANSESOPHAGEAL ECHOCARDIOGRAM (TEE);  Surgeon: Elouise Munroe, MD;  Location: [place];  Service: Cardiology;  Laterality: N/A;  . TRANSMETATARSAL AMPUTATION Right 08/11/2020   Procedure: RIGHT TRANSMETATARSAL AMPUTATION;  Surgeon: Waynetta Sandy, MD;  Location: [place];  Service: Vascular;  Laterality: Right;  . TRANSURETHRAL RESECTION OF PROSTATE N/A 10/20/2019   Procedure: TRANSURETHRAL RESECTION OF THE PROSTATE (TURP);  Surgeon: Irine Seal, MD;  Location: [facility];  Service: Urology;  Laterality: N/A;  . UPPER GASTROINTESTINAL ENDOSCOPY     Family History  Problem Relation Age of Onset  . Hypertension Mother   . Hyperlipidemia Mother   . Hyperlipidemia Father   . Colon cancer Neg Hx   . Colon polyps Neg Hx   . Esophageal cancer Neg Hx   . Rectal cancer Neg Hx   . Stomach cancer Neg Hx    Social History:  reports that he has quit smoking. His smoking use included cigarettes. He quit smokeless tobacco  use about 42 years ago.  His smokeless tobacco use included chew. He reports previous alcohol use of about 1.0 standard drink of alcohol per week. He reports that he does not use drugs. Allergies  Allergen Reactions  . Cefepime Other (See Comments)    Pt had BAD encephalopathy from Cefepime   Prior to Admission medications   Medication Sig Start Date End Date Taking? Authorizing Provider  aspirin EC 81 MG tablet Take 1 tablet (81 mg total) by mouth daily. 10/22/19  Yes Eugenie Filler, MD  atorvastatin (LIPITOR) 80 MG tablet Take 1 tablet (80 mg total) by mouth at bedtime. IM program 02/29/20  Yes Madalyn Rob, MD  clopidogrel (PLAVIX) 75 MG tablet Take 1 tablet (75 mg total) by mouth daily. 05/10/20  Yes Serafina Mitchell, MD  dorzolamide-timolol (COSOPT) 22.3-6.8 MG/ML ophthalmic solution Place 1 drop into the right eye 2 (two) times daily. 12/03/19  Yes Angiulli, Lavon Paganini, PA-C  finasteride (PROSCAR) 5 MG tablet Take 1 tablet (5 mg total) by mouth daily. 02/29/20  Yes Madalyn Rob, MD  lidocaine-prilocaine (EMLA) cream Apply 1 application topically daily  as needed (prior to fistula use).   Yes [provider]  pantoprazole (PROTONIX) 40 MG tablet Take 1 tablet (40 mg total) by mouth daily. 08/19/20 11/17/20 Yes Iona Beard, MD  sertraline (ZOLOFT) 25 MG tablet Take 25 mg by mouth daily.   Yes [provider]  sevelamer carbonate (RENVELA) 800 MG tablet Take 800 mg by mouth See admin instructions. Take 800 mg by mouth three times a day with food on Sun/Mon/Wed/Fri and two times a day on Tues/Thurs/Sat   Yes [provider]  vitamin B-12 (CYANOCOBALAMIN) 500 MCG tablet Take 500 mcg by mouth daily.   Yes [provider]  Accu-Chek FastClix Lancets MISC Check blood sugar up to 7 times a week as instructed 12/03/19   Angiulli, Lavon Paganini, PA-C  acetaminophen (TYLENOL) 500 MG tablet Take 500 mg by mouth every 6 (six) hours as needed for moderate pain or headache.     [provider]  Blood Glucose Monitoring Suppl (ACCU-CHEK GUIDE) w/Device KIT 1 each by Does not apply route daily. Check blood sugar as instructed up to 7 times a week 12/03/19   Angiulli, Lavon Paganini, PA-C  Darbepoetin Alfa (ARANESP) 40 MCG/0.4ML SOSY injection Inject 0.4 mLs (40 mcg total) into the vein every Saturday with hemodialysis. 08/20/20   Iona Beard, MD  glucose blood (ACCU-CHEK GUIDE) test strip Check blood sugar up to 7 times a week as instructed 12/03/19   Angiulli, Lavon Paganini, PA-C  mirtazapine (REMERON) 15 MG tablet Take 1 tablet (15 mg total) by mouth at bedtime. Patient not taking: Reported on 09/06/2020 08/19/20   Iona Beard, MD  polyethylene glycol (MIRALAX / GLYCOLAX) 17 g packet Take 17 g by mouth daily as needed for moderate constipation.     [provider]   Current Facility-Administered Medications  Medication Dose Route Frequency Provider Last Rate Last Admin  . 0.9 %  sodium chloride infusion   Intravenous Continuous [name], MD 10 mL/hr at 09/08/20 0902 Restarted at 09/08/20 1019  . 0.9 %  sodium chloride infusion  100 mL Intravenous PRN Tobie Poet E, NP      . 0.9 %  sodium chloride infusion  100 mL Intravenous PRN Adelfa Koh, NP      . acetaminophen (TYLENOL) tablet 650 mg  650 mg Oral Q6H PRN [name], MD       Or  . acetaminophen (TYLENOL) suppository 650 mg  650 mg Rectal Q6H PRN [name], MD      . alteplase (CATHFLO ACTIVASE) injection 2 mg  2 mg Intracatheter Once PRN Adelfa Koh, NP      . aspirin EC tablet 81 mg  81 mg Oral Daily Sherry Ruffing, Marissa M, MD      . atorvastatin (LIPITOR) tablet 80 mg  80 mg Oral QHS [name], MD      . chlorhexidine (PERIDEX) 0.12 % solution           . Chlorhexidine Gluconate Cloth 2 % PADS 6 each  6 each Topical Once [name], MD       And  . Chlorhexidine Gluconate Cloth 2 % PADS 6 each  6 each Topical Once [name], MD       . clopidogrel (PLAVIX) tablet 75 mg  75 mg Oral Daily [name], MD      . Derrill Memo ON 09/09/2020] docusate sodium (COLACE) capsule 100 mg  100 mg Oral Daily Rhyne, Samantha J, PA-C      .  finasteride (PROSCAR) tablet 5 mg  5 mg Oral Daily Sherry Ruffing, Marissa M, MD      . heparin injection 1,000 Units  1,000 Units Dialysis PRN Adelfa Koh, NP      . heparin injection 5,000 Units  5,000 Units Subcutaneous Q8H Lonia Skinner M, MD      . HYDROmorphone (DILAUDID) injection 0.5 mg  0.5 mg Intravenous Q4H PRN Rhyne, Hulen Shouts, PA-C      . HYDROmorphone (DILAUDID) injection 1-2 mg  1-2 mg Intravenous Q3H PRN Gilles Chiquito B, MD      . insulin aspart (novoLOG) injection 0-6 Units  0-6 Units Subcutaneous TID WC [name], MD      . lidocaine (PF) (XYLOCAINE) 1 % injection 5 mL  5 mL Intradermal PRN Adelfa Koh, NP      . lidocaine-prilocaine (EMLA) cream 1 application  1 application Topical PRN Tobie Poet E, NP      . magnesium sulfate IVPB 2 g 50 mL  2 g Intravenous Daily PRN Rhyne, Samantha J, PA-C      . ondansetron (ZOFRAN) injection 4 mg  4 mg Intravenous Q6H PRN Rhyne, Samantha J, PA-C      . oxyCODONE-acetaminophen (PERCOCET/ROXICET) 5-325 MG per tablet 1-2 tablet  1-2 tablet Oral Q4H PRN Rhyne, Samantha J, PA-C      . pantoprazole (PROTONIX) EC tablet 40 mg  40 mg Oral Daily Rhyne, Samantha J, PA-C      . pentafluoroprop-tetrafluoroeth (GEBAUERS) aerosol 1 application  1 application Topical PRN Adelfa Koh, NP      . phenol (CHLORASEPTIC) mouth spray 1 spray  1 spray Mouth/Throat PRN Rhyne, Samantha J, PA-C      . potassium chloride SA (KLOR-CON) CR tablet 20-40 mEq  20-40 mEq Oral Daily PRN Rhyne, Samantha J, PA-C      . sertraline (ZOLOFT) tablet 25 mg  25 mg Oral Daily Sherry Ruffing, Marissa M, MD      . vancomycin (VANCOREADY) IVPB 1000 mg/200 mL  1,000 mg Intravenous Q12H Rhyne, Hulen Shouts, PA-C       Labs: Basic Metabolic Panel: Recent Labs   Lab 09/08/20 0633  NA 136  K 3.0*  CL 92*  CO2 29  GLUCOSE 220*  BUN 30*  CREATININE 5.35*  CALCIUM 9.0   Liver Function Tests: Recent Labs  Lab 09/08/20 0633  AST 14*  ALT 6  ALKPHOS 72  BILITOT 0.6  PROT 6.9  ALBUMIN 2.5*   No results for input(s): LIPASE, AMYLASE in the last 168 hours. No results for input(s): AMMONIA in the last 168 hours. CBC: Recent Labs  Lab 09/08/20 0633  WBC 9.7  HGB 8.9*  HCT 28.9*  MCV 95.1  PLT 384   Cardiac Enzymes: No results for input(s): CKTOTAL, CKMB, CKMBINDEX, TROPONINI in the last 168 hours. CBG: Recent Labs  Lab 09/08/20 0602 09/08/20 0844 09/08/20 1042 09/08/20 1556  GLUCAP 184* 202* 240* 180*   Iron Studies: No results for input(s): IRON, TIBC, TRANSFERRIN, FERRITIN in the last 72 hours. Studies/Results: No results found.  ROS: All others negative except those listed in HPI.  Physical Exam: Vitals:   09/08/20 1430 09/08/20 1445 09/08/20 1500 09/08/20 1602  BP:  125/60 118/63 118/69  Pulse: 67 67 78 67  Resp: (!) _0 Temp:  98.8 F (37.1 C) 97.7 F (36.5 C) 97.8 F (36.6 C)  TempSrc:   Oral Oral  SpO2: 100% 100% 98% 100%  Weight:  Height:         General: Appears comfortable; on 2L O2 via [place]; in no acute respiratory distress Head: NCAT sclera not icteric Lungs: CTA bilaterally. No wheeze, rales or rhonchi. Breathing is unlabored. Heart: RRR. No murmur, rubs or gallops.  Abdomen: soft, nontender, +BS.  Lower extremities: no edema bilateral hips or lower extremities Neuro: AXO X 3 Dialysis Access: R AVF (+) Bruit/Thrill  Dialysis Orders:  TTS - [place]  4hrs, BFR 400, DFR 500 (d/t dialysate fluid shortage),  EDW 61kg, 2K/ 2Ca  Access: R AVF Mircera 127mg q2wks - last 09/06/20 Venofer 1071mX 10 HD treatments- last 09/06/20  Renvela 80026m tablet TID with meals  Last Labs: Hgb 8.9, TSAT 17 (08/25/20 OP HD), K 3.0, Ca 9.0, P 6.1 (08/25/20 OP HD) , PTH 262 (08/25/20 OP HD),  Alb 2.5  Assessment/Plan: 1. PVD RLE s/p R non-healing TMA/R Below Knee Amputation- Patient admitted d/t multiple comorbidities. Patient is currently stable. Continue pain management for R BKA. Followed by vascular. 2. ESRD - on HD TTS. Scheduled for HD tomorrow 09/09/20 3. Hypertension/volume  - Blood pressures stable. Patient appears euvolemic on exam; no evidence of volume overload or acute distress. Will remove volume as tolerated. 4. Anemia of CKD - Hgb now 8.9. Last Tsat 16 (OP HD). Will resume Aranesp [id number]. Fe on hold for now, patient is receiving ABX for surgical prophylaxis 5. Secondary Hyperparathyroidism - Ca now 9.0, Last PTH at goal (OP HD), Last PO4 6.1 (OP HD), will continue binder. 6. Nutrition - Continue renal diet with fluid restriction  CourTobie Poet [facility] 09/08/2020, 4:16 PM

## 2020-09-08 NOTE — Anesthesia Procedure Notes (Signed)
Procedure Name: Intubation Date/Time: 09/08/2020 9:11 AM Performed by: Trinna Post., CRNA Pre-anesthesia Checklist: Patient identified, Emergency Drugs available, Suction available, Patient being monitored and Timeout performed Patient Re-evaluated:Patient Re-evaluated prior to induction Oxygen Delivery Method: Circle system utilized Preoxygenation: Pre-oxygenation with 100% oxygen Induction Type: IV induction Ventilation: Mask ventilation without difficulty and Oral airway inserted - appropriate to patient size Laryngoscope Size: Mac and 4 Grade View: Grade I Tube type: Oral Tube size: 7.5 mm Number of attempts: 1 Airway Equipment and Method: Stylet Placement Confirmation: ETT inserted through vocal cords under direct vision,  positive ETCO2 and breath sounds checked- equal and bilateral Secured at: 22 cm Tube secured with: Tape Dental Injury: Teeth and Oropharynx as per pre-operative assessment

## 2020-09-09 ENCOUNTER — Ambulatory Visit: Payer: Medicare PPO | Admitting: *Deleted

## 2020-09-09 ENCOUNTER — Encounter (HOSPITAL_COMMUNITY): Payer: Self-pay | Admitting: Vascular Surgery

## 2020-09-09 DIAGNOSIS — Z89511 Acquired absence of right leg below knee: Secondary | ICD-10-CM | POA: Diagnosis not present

## 2020-09-09 DIAGNOSIS — E785 Hyperlipidemia, unspecified: Secondary | ICD-10-CM

## 2020-09-09 DIAGNOSIS — F32A Depression, unspecified: Secondary | ICD-10-CM | POA: Diagnosis not present

## 2020-09-09 DIAGNOSIS — I10 Essential (primary) hypertension: Secondary | ICD-10-CM

## 2020-09-09 DIAGNOSIS — N186 End stage renal disease: Secondary | ICD-10-CM

## 2020-09-09 DIAGNOSIS — I739 Peripheral vascular disease, unspecified: Secondary | ICD-10-CM

## 2020-09-09 DIAGNOSIS — I5042 Chronic combined systolic (congestive) and diastolic (congestive) heart failure: Secondary | ICD-10-CM

## 2020-09-09 DIAGNOSIS — I2510 Atherosclerotic heart disease of native coronary artery without angina pectoris: Secondary | ICD-10-CM

## 2020-09-09 DIAGNOSIS — I6389 Other cerebral infarction: Secondary | ICD-10-CM

## 2020-09-09 DIAGNOSIS — E1151 Type 2 diabetes mellitus with diabetic peripheral angiopathy without gangrene: Secondary | ICD-10-CM | POA: Diagnosis not present

## 2020-09-09 DIAGNOSIS — E11319 Type 2 diabetes mellitus with unspecified diabetic retinopathy without macular edema: Secondary | ICD-10-CM

## 2020-09-09 LAB — CBC
HCT: 22.7 % — ABNORMAL LOW (ref 39.0–52.0)
Hemoglobin: 6.9 g/dL — CL (ref 13.0–17.0)
MCH: 29.5 pg (ref 26.0–34.0)
MCHC: 30.4 g/dL (ref 30.0–36.0)
MCV: 97 fL (ref 80.0–100.0)
Platelets: 344 10*3/uL (ref 150–400)
RBC: 2.34 MIL/uL — ABNORMAL LOW (ref 4.22–5.81)
RDW: 15.4 % (ref 11.5–15.5)
WBC: 10.5 10*3/uL (ref 4.0–10.5)
nRBC: 0.2 % (ref 0.0–0.2)

## 2020-09-09 LAB — BASIC METABOLIC PANEL
Anion gap: 15 (ref 5–15)
BUN: 38 mg/dL — ABNORMAL HIGH (ref 8–23)
CO2: 26 mmol/L (ref 22–32)
Calcium: 8.9 mg/dL (ref 8.9–10.3)
Chloride: 96 mmol/L — ABNORMAL LOW (ref 98–111)
Creatinine, Ser: 6.15 mg/dL — ABNORMAL HIGH (ref 0.61–1.24)
GFR, Estimated: 9 mL/min — ABNORMAL LOW (ref >60)
Glucose, Bld: 176 mg/dL — ABNORMAL HIGH (ref 70–99)
Potassium: 4 mmol/L (ref 3.5–5.1)
Sodium: 137 mmol/L (ref 135–145)

## 2020-09-09 LAB — GLUCOSE, CAPILLARY
Glucose-Capillary: 154 mg/dL — ABNORMAL HIGH (ref 70–99)
Glucose-Capillary: 145 mg/dL — ABNORMAL HIGH (ref 70–99)
Glucose-Capillary: 226 mg/dL — ABNORMAL HIGH (ref 70–99)
Glucose-Capillary: 198 mg/dL — ABNORMAL HIGH (ref 70–99)

## 2020-09-09 LAB — PHOSPHORUS: Phosphorus: 5.4 mg/dL — ABNORMAL HIGH (ref 2.5–4.6)

## 2020-09-09 LAB — MAGNESIUM: Magnesium: 2.1 mg/dL (ref 1.7–2.4)

## 2020-09-09 LAB — HEMOGLOBIN AND HEMATOCRIT, BLOOD
HCT: 26.6 % — ABNORMAL LOW (ref 39.0–52.0)
Hemoglobin: 8.5 g/dL — ABNORMAL LOW (ref 13.0–17.0)

## 2020-09-09 LAB — SURGICAL PATHOLOGY

## 2020-09-09 LAB — PREPARE RBC (CROSSMATCH)

## 2020-09-09 MED ORDER — SODIUM CHLORIDE 0.9% IV SOLUTION: Freq: Once | INTRAVENOUS | Status: DC

## 2020-09-09 MED ORDER — NEPRO/CARBSTEADY PO LIQD
237.0000 mL | Freq: Two times a day (BID) | ORAL | Status: DC
  Administered 2020-09-09 – 2020-09-13 (×6): 237 mL via ORAL

## 2020-09-09 MED ORDER — HYDROMORPHONE HCL 1 MG/ML IJ SOLN
INTRAMUSCULAR | Status: AC
  Filled 2020-09-09: qty 0.5

## 2020-09-09 NOTE — Progress Notes (Addendum)
Progress Note    09/09/2020 8:21 AM 1 Day Post-Op  Subjective:  Expected post-op pain   Vitals:   09/08/20 2115 09/09/20 0557  BP: 118/63 132/69  Pulse: 68 68  Resp: 16 17  Temp: 97.8 F (36.6 C) 98 F (36.7 C)  SpO2: 99% 100%    Physical Exam: General appearance: Awake, alert in no apparent distress Cardiac: Heart rate and rhythm are regular Respirations: Nonlabored Extremities: RLE: dressing dry and intact   CBC    Component Value Date/Time   WBC 10.5 09/09/2020 0708   RBC 2.34 (L) 09/09/2020 0708   HGB 6.9 (LL) 09/09/2020 0708   HGB 11.1 (L) 05/02/2020 1454   HCT 22.7 (L) 09/09/2020 0708   HCT 35.5 (L) 05/02/2020 1454   PLT 344 09/09/2020 0708   PLT 221 05/02/2020 1454   MCV 97.0 09/09/2020 0708   MCV 86 05/02/2020 1454   MCH 29.5 09/09/2020 0708   MCHC 30.4 09/09/2020 0708   RDW 15.4 09/09/2020 0708   RDW 15.7 (H) 05/02/2020 1454   LYMPHSABS 1.8 08/11/2020 2233   LYMPHSABS 1.3 05/02/2020 1454   MONOABS 0.9 08/11/2020 2233   EOSABS 0.0 08/11/2020 2233   EOSABS 0.0 05/02/2020 1454   BASOSABS 0.0 08/11/2020 2233   BASOSABS 0.0 05/02/2020 1454    BMET    Component Value Date/Time   NA 137 09/09/2020 0708   NA 137 12/10/2019 1439   K 4.0 09/09/2020 0708   CL 96 (L) 09/09/2020 0708   CO2 26 09/09/2020 0708   GLUCOSE 176 (H) 09/09/2020 0708   BUN 38 (H) 09/09/2020 0708   BUN 80 (HH) 12/10/2019 1439   CREATININE 6.15 (H) 09/09/2020 0708   CALCIUM 8.9 09/09/2020 0708   GFRNONAA 9 (L) 09/09/2020 0708   GFRAA 19 (L) 04/05/2020 0705     Intake/Output Summary (Last 24 hours) at 09/09/2020 0821 Last data filed at 09/08/2020 1700 Gross per 24 hour  Intake 1340 ml  Output 50 ml  Net 1290 ml    HOSPITAL MEDICATIONS Scheduled Meds: . aspirin EC  81 mg Oral Daily  . atorvastatin  80 mg Oral QHS  . Chlorhexidine Gluconate Cloth  6 each Topical Once   And  . Chlorhexidine Gluconate Cloth  6 each Topical Once  . clopidogrel  75 mg Oral Daily  .  docusate sodium  100 mg Oral Daily  . finasteride  5 mg Oral Daily  . heparin  5,000 Units Subcutaneous Q8H  . insulin aspart  0-6 Units Subcutaneous TID WC  . pantoprazole  40 mg Oral Daily  . sertraline  25 mg Oral Daily  . sevelamer carbonate  800 mg Oral TID WC   Continuous Infusions: . sodium chloride 10 mL/hr at 09/08/20 0902  . sodium chloride    . sodium chloride    . magnesium sulfate bolus IVPB    . vancomycin 1,000 mg (09/08/20 2012)   PRN Meds:.sodium chloride, sodium chloride, acetaminophen **OR** acetaminophen, alteplase, heparin, HYDROmorphone (DILAUDID) injection, HYDROmorphone (DILAUDID) injection, lidocaine (PF), lidocaine-prilocaine, magnesium sulfate bolus IVPB, ondansetron, oxyCODONE-acetaminophen, pentafluoroprop-tetrafluoroeth, phenol, potassium chloride  Assessment and Plan: POD 1 right BKA. Take down dressing tomorrow VSS. Afeb. Anemia: multifactorial, acute blood loss. Defer to primary team regarding transfusion   Risa Grill, PA-C [facility] [phone] 09/09/2020  8:21 AM   VASCULAR STAFF ADDENDUM: I agree with the above.  PT / OT / OOB. Needs DC to SNF / inpatient rehab. Dressing down tomorrow.  [name]. Stanford Breed, MD  [facility] Phone Number: [phone] 09/09/2020 1:43 PM

## 2020-09-09 NOTE — Procedures (Signed)
Patient was seen on dialysis and the procedure was supervised.  BFR 400  Via AVF BP is  129/71.   Patient appears to be tolerating treatment well  Louis Meckel 09/09/2020

## 2020-09-09 NOTE — Progress Notes (Signed)
Pt left to unit for HD tx.

## 2020-09-09 NOTE — Progress Notes (Signed)
  Date: 09/09/2020  Patient name: Branch Pacitti  Medical record number: [id number]  Date of birth: Jan 14, 1951   I have seen and evaluated Dorita Fray and discussed their care with the Residency Team. Briefly, Mr. Bretado is a well known patient to our team.  He underwent Right BKA with vascular surgery.  He was admitted to our team to manage his other chronic medical issues which are detailed in the resident's note.  The patient was seen in HD and reported doing well and having no complaints.    Vitals:   09/09/20 1150 09/09/20 1320  BP:  103/69  Pulse: 75 79  Resp:  15  Temp: 97.6 F (36.4 C) 98.9 F (37.2 C)  SpO2: 96% 95%   General: Awake, alert Eyes: Anicteric sclerae HENT: Neck is supple, MMM CV: RR, NR, no murmur Pulm: Breathing comfortably, no increased WOB Abdomen: Soft, NT, +BS MSK: he is s/p amputation on the right.  He has a previous amputation on the left which is well healed.  He has a vascular access in the right upper extremity through which he is undergoing HD Psych: Appears disengaged from the conversation.   Assessment and Plan: I have seen and evaluated the patient as outlined above. I agree with the formulated Assessment and Plan as detailed in the residents' note, with the following changes:   1. PVD s/p right BKA - Follow up with vascular surgery recommendations - Pain control - Aspirin/plavix - PT/OT when appropriate.   2. ESRD on HD - Nephrology consulted - HD while in house  Other issues per Resident note.   Sid Falcon, MD 3/4/20225:35 PM

## 2020-09-09 NOTE — Progress Notes (Addendum)
Subjective:  Seen on HD , co some Post op discomfort , noted HGB 6.9  For 1 unit prbcs on HD   Objective Vital signs in last 24 hours: Vitals:   09/08/20 2115 09/09/20 0500 09/09/20 0557 09/09/20 0835  BP: 118/63  132/69 129/71  Pulse: 68  68   Resp: 16  17 (!) 9  Temp: 97.8 F (36.6 C)  98 F (36.7 C)   TempSrc: Oral  Oral   SpO2: 99%  100%   Weight:  60.8 kg    Height:       Weight change: 0.97 kg  Physical Exam: General: alert chronically elderly male on hd NAD Heart:RRR, No mrg Lungs: CTA ant. nonlabored  Abdomen: BS+ , soft , NT, ND Extremities:R BKA  Dressing dry/clean, LLE no pedal edema  Dialysis Access: RUA AVF  Patent on HD   Dialysis Orders:  TTS - [place]  4hrs, BFR 400, DFR 500 (d/t dialysate fluid shortage),  EDW 61kg, 2K/ 2Ca  Access: R AVF Mircera 164mcg q2wks - last 09/06/20 Venofer 100mg  X 10 HD treatments- last 09/06/20  Renvela 800mg  1 tablet TID with meals  Last Labs: Hgb 8.9, TSAT 17 (08/25/20 OP HD), K 3.0, Ca 9.0, P 6.1 (08/25/20 OP HD) , PTH 262 (08/25/20 OP HD), Alb 2.5  Problem/Plan: 1. PVD RLE s/p R non-healing TMA/now sp R BKA 09/08/20- Plan/mange per [facility]VS 2. ESRD - on HD TTS. Off Schedule HD today via AVF . K 4.0  3. Hypertension/volume  - BP  stable. appears euvolemic, UF  as tolerated. 4. Anemia of CKD - Hgb drop to 6.9 afetr yest BKA  Admit hgb= 8.9. for 1 unit prbcs on HD /due Aranesp 100 mcg next 3/15// Fe on hold for now, patient is receiving ABX for surgical -  Will give ESA sooner 5. Secondary Hyperparathyroidism - Ca/phos stable continue binder.  Last PTH at goal (OP HD), no vit d  6. Nutrition alb 2.5 add nepro Continue renal diet with fluid restriction  Ernest Haber, PA-C [place] [phone] 09/09/2020,8:55 AM  LOS: 1 day    Patient seen and examined, agree with above note with above modifications. Very unfortunate BM with many medical issues since starting HD- now s/p BKA-  Seen on hd he says  no pain-  hgb low to be given one unit of blood-  HD today off schedule -  Plan most likely next for Monday - then maybe next Thursday  Corliss Parish, MD 09/09/2020     Labs: Basic Metabolic Panel: Recent Labs  Lab 09/08/20 0633 09/09/20 0708  NA 136 137  K 3.0* 4.0  CL 92* 96*  CO2 29 26  GLUCOSE 220* 176*  BUN 30* 38*  CREATININE 5.35* 6.15*  CALCIUM 9.0 8.9  PHOS  --  5.4*   Liver Function Tests: Recent Labs  Lab 09/08/20 0633  AST 14*  ALT 6  ALKPHOS 72  BILITOT 0.6  PROT 6.9  ALBUMIN 2.5*   No results for input(s): LIPASE, AMYLASE in the last 168 hours. No results for input(s): AMMONIA in the last 168 hours. CBC: Recent Labs  Lab 09/08/20 0633 09/09/20 0708  WBC 9.7 10.5  HGB 8.9* 6.9*  HCT 28.9* 22.7*  MCV 95.1 97.0  PLT 384 344   Cardiac Enzymes: No results for input(s): CKTOTAL, CKMB, CKMBINDEX, TROPONINI in the last 168 hours. CBG: Recent Labs  Lab 09/08/20 0844 09/08/20 1042 09/08/20 1556 09/08/20 2351 09/09/20 9702  GLUCAP 202* 240* 180* 160* 154*    Studies/Results: No results found. Medications: . sodium chloride 10 mL/hr at 09/08/20 0902  . sodium chloride    . sodium chloride    . magnesium sulfate bolus IVPB    . vancomycin 1,000 mg (09/08/20 2012)   . aspirin EC  81 mg Oral Daily  . atorvastatin  80 mg Oral QHS  . Chlorhexidine Gluconate Cloth  6 each Topical Once   And  . Chlorhexidine Gluconate Cloth  6 each Topical Once  . clopidogrel  75 mg Oral Daily  . docusate sodium  100 mg Oral Daily  . finasteride  5 mg Oral Daily  . heparin  5,000 Units Subcutaneous Q8H  . insulin aspart  0-6 Units Subcutaneous TID WC  . pantoprazole  40 mg Oral Daily  . sertraline  25 mg Oral Daily  . sevelamer carbonate  800 mg Oral TID WC

## 2020-09-09 NOTE — Progress Notes (Signed)
PT Cancellation Note  Patient Details Name: Dennis Macias MRN: [id number] DOB: 1950/07/25   Cancelled Treatment:    Reason Eval/Treat Not Completed: Patient at procedure or test/unavailable (at HD) this morning. PT will continue to follow and evaluate as time/schedule allow.  Hardie Pulley, DPT   Acute Rehabilitation Department Pager #: [phone]   Otho Bellows 09/09/2020, 8:36 AM

## 2020-09-09 NOTE — Progress Notes (Addendum)
Receive a critical lab value of Hgb of 6.9, Dr. Konrad Penta and Dr. Marianna Payment were paged still awaiting a return call.. Pt went to HD tx, PA Ernest Haber made aware of hgb of 6.9. and HD nurse made aware of above and informed this writer that pt is to be transfused 1 unit prbc. Pt is alert/oriented in no acute distress. No complaints voiced.

## 2020-09-09 NOTE — Progress Notes (Signed)
   Subjective:   Mr. Keenum was seen during his dialysis session today. He states that he is feeling well. Denies any leg pain, fevers, chills, nausea, or any other complaints. Agrees to inform his nurse if he experiences pain so that we can adjust his medications appropriately.   Objective:  Vital signs in last 24 hours: Vitals:   09/09/20 1130 09/09/20 1138 09/09/20 1150 09/09/20 1320  BP:  (!) 158/64  103/69  Pulse:  76 75 79  Resp: 14 10  15   Temp:  98 F (36.7 C) 97.6 F (36.4 C) 98.9 F (37.2 C)  TempSrc:  Oral Oral Oral  SpO2:   96% 95%  Weight:   62.5 kg   Height:       General: Patient appears thin and chronically ill, in no acute distress.  Eyes: Sclera non-icteric. No conjunctival injection.  Respiratory: Lungs are CTA, bilaterally anteriorly. No wheezes, rales, or rhonchi. Saturating well on room air.  Cardiovascular: Regular rate and rhythm. No murmurs, rubs, or gallops. No LE edema. Musculoskeletal: Healed left BKA, new R BKA. Abdominal: Firm but without tenderness or guarding.  Skin: Pallor present. R BKA covered by bandages without noticeable drainage or surrounding erythema. No other lesions or rashes.  Neurologic: Awake and oriented  Assessment/Plan:  Active Problems:   S/P BKA (below knee amputation) [facility])  Mr. Cessna is a 70 y.o. M w/ PMHx ESRD on HD, HFrEF, PVD s/p bilateral BKA, diet-controlled DM, CVA, and HTN, admitted for management of co-morbidities and HD in the post-operative setting after R BKA 09/08/20.  PVD w/ Bilateral BKA's Patient seems to be recovering well from R BKA performed yesterday after having failed TMA 08/11/20. He denies any pain. Hgb dropped to < 7, although dressing is dry without surrounding erythema. Afebrile without chills, nausea.   -Vascular surgery following, appreciate recommendations -Restarted on ASA and Plavix  -Tylenol for mild pain -Dilaudid PRN for pain control  ESRD on HD Hypokalemia, Resolved Patient tolerated  dialysis session well today. On TTS schedule at home. K replaced. Appears euvolemic. -Nephrology following, appreciate their recommendations -Continue to monitor renal function  Acute on Chronic Normocytic Anemia Hgb dropped 2 pts, < 7 following BKA.  -Transfused 1 unit pRBC's during HD -Follow up post-transfusion H&H, goal Hgb > 8  Type 2 diabetes: Patient is not on any medications at home at this time. CBG's improved into target range today. -Frequent CBGs -SSI sensitive  Hypertension: Hyperlipidemia: Patient has a history of hypertension, not on any home medications at this time. Blood pressures highly variable although in setting of HD, HDS.  -Resume home Lipitor 80 mg  Anticipated Discharge Location: Pending PT/OT eval, likely SNF Barriers to Discharge: Healing of BKA Dispo: Pending wound healing  Code Status: Full Code DVT PPx: Heparin Diet: Renal w/ Fluid Restriction 1.2L  IVF: None   Jeralyn Bennett, MD 09/09/2020, 3:08 PM Pager: [phone] After 5pm on weekdays and 1pm on weekends: On Call pager [phone]

## 2020-09-09 NOTE — Progress Notes (Signed)
Inpatient Rehab Admissions Coordinator:   Consult received.  Awaiting therapy evaluations.  Note that [facility] Medicare unlikely to approve diagnosis of BKA for CIR.  Will f/u once therapy has a chance to evaluate pt.   Shann Medal, PT, DPT Admissions Coordinator [phone] 09/09/20  3:45 PM

## 2020-09-09 NOTE — Progress Notes (Signed)
   09/09/20 0800  OT Visit Information  Last OT Received On 09/09/20  Reason Eval/Treat Not Completed Patient at procedure or test/ unavailable  History of Present Illness Pt is a 70 y/o male with PMH of L BKA, s/p balloon angioplasty of occluded vessels R foot 11/21, L ICA CVA (2017), NIDDM type II with retinopathy, CKD on hemodialysis, nephrotic syndrome, combined systolic and diastolic CHF, CAD w/ NSTEMI, HTN, HLD, tubular adenmoa of colon, tachycardia, syncope, PVD, and protein malnutrition who presents after a syncopal episode at hemodialysis. His R foot wounds continue to worsen with noted dry gangrene on 1st and 2nd toes. now s/p angioplasty of the right posterior tibial artery on 1/27; MRI revealed acute ischemic nonhemorrhagic L thalamic infarct and multiple chronic micro hemorrhages scattered throughout both cerebral hemispheres; R TMA 08/11/20.   Pt currently at HD. Plan to reattempt.  Tyrone Schimke, OT Acute Rehabilitation Services Pager: [phone] Office: [phone]

## 2020-09-09 NOTE — Evaluation (Signed)
Occupational Therapy Evaluation Patient Details Name: Dennis Macias MRN: [id number] DOB: [birth year] Today's Date: 09/09/2020    History of Present Illness Pt is a 70 y/o male with PMH of L BKA, s/p balloon angioplasty of occluded vessels R foot 11/21, L ICA CVA (2017), NIDDM type II with retinopathy, CKD on hemodialysis, nephrotic syndrome, combined systolic and diastolic CHF, CAD w/ NSTEMI, HTN, HLD, tubular adenmoa of colon, tachycardia, syncope, PVD, and protein malnutrition who presents after a syncopal episode at hemodialysis. His R foot wounds continue to worsen with noted dry gangrene on 1st and 2nd toes. now s/p angioplasty of the right posterior tibial artery on 1/27; MRI revealed acute ischemic nonhemorrhagic L thalamic infarct and multiple chronic micro hemorrhages scattered throughout both cerebral hemispheres; R TMA 08/11/20.  s/p R BKA   Clinical Impression   Patient admitted for R BKA, see above for recent extensive medical history.  PTA, he was at a SNF beginning rehab after a L BKA per the patient.  He had not received his prothesis as of yet, and needed up to Max A for lower body ADL.  Patient found with BM, and bulk of session involved getting him and the linens changed,  Patient was able to roll form side to side with the use of bed rail.  He was up to Mod A to sit EOB with poor sitting balance.  He was Mod A to slide his bottom to the [facility], and Mod A to lie back down.  His plan is to return to SNF for a continuation of his rehab.  OT is indicated in the acute setting to maximize his functional status and help with a transition back to SNF.      Follow Up Recommendations  SNF;Supervision/Assistance - 24 hour    Equipment Recommendations  None recommended by OT    Recommendations for Other Services       Precautions / Restrictions Precautions Precautions: Fall Precaution Comments: hx of L BKA; HOH.  New R BKA Restrictions Weight Bearing Restrictions: Yes RLE Weight Bearing: Non  weight bearing LLE Weight Bearing: Non weight bearing      Mobility Bed Mobility Overal bed mobility: Needs Assistance Bed Mobility: Rolling;Supine to Sit;Sit to Supine Rolling: Modified independent (Device/Increase time)   Supine to sit: Mod assist Sit to supine: Mod assist        Transfers                      Balance Overall balance assessment: Needs assistance Sitting-balance support: Bilateral upper extremity supported;Feet unsupported Sitting balance-Leahy Scale: Poor   Postural control: Posterior lean;Left lateral lean                                 ADL either performed or assessed with clinical judgement   ADL               Lower Body Bathing: Total assistance;Bed level               Toileting- Clothing Manipulation and Hygiene: Total assistance;Bed level               Vision Baseline Vision/History: Retinopathy;Wears glasses;Cataracts Wears Glasses: Reading only Patient Visual Report: No change from baseline                  Pertinent Vitals/Pain Faces Pain Scale: Hurts little more Pain Location: R residual limb Pain Descriptors /  Indicators: Tender;Grimacing Pain Intervention(s): Monitored during session     Hand Dominance Right   Extremity/Trunk Assessment Upper Extremity Assessment Upper Extremity Assessment: Generalized weakness   Lower Extremity Assessment Lower Extremity Assessment: Defer to PT evaluation       Communication Communication Communication: HOH   Cognition Arousal/Alertness: Awake/alert Behavior During Therapy: Flat affect Overall Cognitive Status: Impaired/Different from baseline Area of Impairment: Memory;Following commands;Problem solving                     Memory: Decreased short-term memory Following Commands: Follows one step commands with increased time     Problem Solving: Slow processing;Difficulty sequencing;Requires verbal cues                       Home Living Family/patient expects to be discharged to:: Skilled nursing facility Living Arrangements: Spouse/significant other Available Help at Discharge: Family;Available 24 hours/day Type of Home: Apartment Home Access: Stairs to enter Entrance Stairs-Number of Steps: 6 Entrance Stairs-Rails: Right Home Layout: One level     Bathroom Shower/Tub: Teacher, early years/pre: Handicapped height Bathroom Accessibility: No   Home Equipment: Tub bench;Walker - 2 wheels;Cane - quad;Cane - single point;Bedside commode;Wheelchair - manual          Prior Functioning/Environment Level of Independence: Needs assistance  Gait / Transfers Assistance Needed: Pt requires assistance for transfers and is carried down/up the stairs in his w/c. ADL's / Homemaking Assistance Needed: Pt requires assistance for bathing and dressing lower half. Pt does not cook or clean. He reports his w/c doesn't fit through BR door so therefore, he sponge bathes. Communication / Swallowing Assistance Needed: Pt is independent with feeding self.          OT Problem List: Decreased strength;Decreased activity tolerance;Impaired balance (sitting and/or standing);Decreased cognition;Decreased knowledge of use of DME or AE;Decreased safety awareness      OT Treatment/Interventions: Self-care/ADL training;Therapeutic exercise;DME and/or AE instruction;Therapeutic activities;Patient/family education;Balance training;Neuromuscular education;Cognitive remediation/compensation    OT Goals(Current goals can be found in the care plan section) Acute Rehab OT Goals Patient Stated Goal: get back to rehab OT Goal Formulation: With patient Time For Goal Achievement: 09/23/20 Potential to Achieve Goals: Good ADL Goals Pt Will Perform Grooming: with min guard assist;sitting Pt Will Perform Upper Body Bathing: with min guard assist;sitting Pt Will Perform Lower Body Bathing: with min assist;bed level Pt Will  Perform Upper Body Dressing: with min guard assist;sitting Pt Will Perform Lower Body Dressing: with min assist;bed level  OT Frequency: Min 2X/week   Barriers to D/C:    none noted       Co-evaluation              AM-PAC OT "6 Clicks" Daily Activity     Outcome Measure Help from another person eating meals?: A Little Help from another person taking care of personal grooming?: A Little Help from another person toileting, which includes using toliet, bedpan, or urinal?: Total Help from another person bathing (including washing, rinsing, drying)?: A Lot Help from another person to put on and taking off regular upper body clothing?: A Lot Help from another person to put on and taking off regular lower body clothing?: Total 6 Click Score: 12   End of Session Nurse Communication: Mobility status  Activity Tolerance: Patient tolerated treatment well Patient left: in bed;with call bell/phone within reach;with bed alarm set;with nursing/sitter in room  OT Visit Diagnosis: Muscle weakness (generalized) (M62.81);Other symptoms and signs involving  cognitive function;Pain Pain - Right/Left: Right Pain - part of body: Leg                Time: 1710-1738 OT Time Calculation (min): 28 min Charges:  OT General Charges $OT Visit: 1 Visit OT Evaluation $OT Eval Moderate Complexity: 1 Mod OT Treatments $Self Care/Home Management : 8-22 mins  09/09/2020  Rich, OTR/L  Acute Rehabilitation Services  Office:  [place] 09/09/2020, 5:45 PM

## 2020-09-09 NOTE — Progress Notes (Signed)
   09/09/20 0912  Provider Notification  Provider Name/Title Dr. Clover Mealy  Date Provider Notified 09/09/20  Time Provider Notified [phone]  Notification Type Face-to-face  Notification Reason Critical result (hgb = 6.9)  Date Critical Result Received 09/09/20  Time Critical Result Received 0915  Provider response At bedside (To give 1 unit per order)  Date of Provider Response 09/09/20  Time of Provider Response [phone]

## 2020-09-09 NOTE — Progress Notes (Signed)
Internal Medicine Clinic Resident  I have personally reviewed this encounter including the documentation in this note and/or discussed this patient with the care management provider. I will address any urgent items identified by the care management provider and will communicate my actions to the patient's PCP. I have reviewed the patient's CCM visit with my supervising attending, Dr Jimmye Norman.  Gaylan Gerold, DO 09/09/2020

## 2020-09-09 NOTE — Chronic Care Management (AMB) (Signed)
   09/09/2020  Dennis Macias Apr 29, 1951 [id number]  Chart reviewed and noted patient currently hospitalized s/p right BKA on 09/08/20. Patient was admitted from [facility].  Patient's primary caregiver and significant other [name] has indicated she can no longer care for patient in the home as his care needs are too great and her job and health insurance are in jeopardy as a result of absences from work due to assisting with patient's care and/or transportation etc. Please see this CCM RN's note of 2/21 for further details.  Plan: Will notify [facility] CM hospital liaison of caregiver's concerns. Kelli Churn RN, CCM, [facility] RN Care Manager [phone]

## 2020-09-10 DIAGNOSIS — F32A Depression, unspecified: Secondary | ICD-10-CM | POA: Diagnosis not present

## 2020-09-10 DIAGNOSIS — E1151 Type 2 diabetes mellitus with diabetic peripheral angiopathy without gangrene: Secondary | ICD-10-CM | POA: Diagnosis not present

## 2020-09-10 DIAGNOSIS — I10 Essential (primary) hypertension: Secondary | ICD-10-CM | POA: Diagnosis not present

## 2020-09-10 DIAGNOSIS — E785 Hyperlipidemia, unspecified: Secondary | ICD-10-CM | POA: Diagnosis not present

## 2020-09-10 LAB — GLUCOSE, CAPILLARY
Glucose-Capillary: 163 mg/dL — ABNORMAL HIGH (ref 70–99)
Glucose-Capillary: 169 mg/dL — ABNORMAL HIGH (ref 70–99)
Glucose-Capillary: 170 mg/dL — ABNORMAL HIGH (ref 70–99)
Glucose-Capillary: 105 mg/dL — ABNORMAL HIGH (ref 70–99)
Glucose-Capillary: 114 mg/dL — ABNORMAL HIGH (ref 70–99)

## 2020-09-10 LAB — CBC
HCT: 26.3 % — ABNORMAL LOW (ref 39.0–52.0)
Hemoglobin: 8.1 g/dL — ABNORMAL LOW (ref 13.0–17.0)
MCH: 29.1 pg (ref 26.0–34.0)
MCHC: 30.8 g/dL (ref 30.0–36.0)
MCV: 94.6 fL (ref 80.0–100.0)
Platelets: 363 10*3/uL (ref 150–400)
RBC: 2.78 MIL/uL — ABNORMAL LOW (ref 4.22–5.81)
RDW: 15.6 % — ABNORMAL HIGH (ref 11.5–15.5)
WBC: 10.9 10*3/uL — ABNORMAL HIGH (ref 4.0–10.5)
nRBC: 0.2 % (ref 0.0–0.2)

## 2020-09-10 LAB — TYPE AND SCREEN
ABO/RH(D): AB POS
Antibody Screen: NEGATIVE
Unit division: 0

## 2020-09-10 LAB — RENAL FUNCTION PANEL
Albumin: 2.4 g/dL — ABNORMAL LOW (ref 3.5–5.0)
Anion gap: 14 (ref 5–15)
BUN: 20 mg/dL (ref 8–23)
CO2: 26 mmol/L (ref 22–32)
Calcium: 8.5 mg/dL — ABNORMAL LOW (ref 8.9–10.3)
Chloride: 94 mmol/L — ABNORMAL LOW (ref 98–111)
Creatinine, Ser: 3.64 mg/dL — ABNORMAL HIGH (ref 0.61–1.24)
GFR, Estimated: 17 mL/min — ABNORMAL LOW (ref >60)
Glucose, Bld: 193 mg/dL — ABNORMAL HIGH (ref 70–99)
Phosphorus: 3.3 mg/dL (ref 2.5–4.6)
Potassium: 3.6 mmol/L (ref 3.5–5.1)
Sodium: 134 mmol/L — ABNORMAL LOW (ref 135–145)

## 2020-09-10 LAB — BPAM RBC
Blood Product Expiration Date: 202204112359
ISSUE DATE / TIME: 202203041056
Unit Type and Rh: 8400

## 2020-09-10 LAB — RETICULOCYTES
Immature Retic Fract: 35.2 % — ABNORMAL HIGH (ref 2.3–15.9)
RBC.: 2.76 MIL/uL — ABNORMAL LOW (ref 4.22–5.81)
Retic Count, Absolute: 115.1 10*3/uL (ref 19.0–186.0)
Retic Ct Pct: 4.2 % — ABNORMAL HIGH (ref 0.4–3.1)

## 2020-09-10 LAB — FERRITIN: Ferritin: 709 ng/mL — ABNORMAL HIGH (ref 24–336)

## 2020-09-10 MED ORDER — SODIUM CHLORIDE 0.9 % IV SOLN
100.0000 mg | INTRAVENOUS | Status: DC
  Administered 2020-09-12: 100 mg via INTRAVENOUS
  Filled 2020-09-10 (×3): qty 5

## 2020-09-10 NOTE — Progress Notes (Signed)
HD#2 Subjective:  Overnight Events: no events overnight.    Patient resting in bed. He denies acute complaints. He denies chest pain, shortness of breath, abdominal pain or leg pain.   Objective:  Vital signs in last 24 hours: Vitals:   09/09/20 2236 09/10/20 0400 09/10/20 0500 09/10/20 0746  BP: 119/64 118/69  133/70  Pulse: 74 75  79  Resp: 16 17  19   Temp: 99.1 F (37.3 C) 98.8 F (37.1 C)  98.8 F (37.1 C)  TempSrc: Oral Oral    SpO2: 100% 99%    Weight:   63.4 kg   Height:       Supplemental O2: Room Air SpO2: 99 % O2 Flow Rate (L/min): 1 L/min   Physical Exam:  Physical Exam Constitutional:      Appearance: Normal appearance.  HENT:     Head: Normocephalic and atraumatic.  Cardiovascular:     Rate and Rhythm: Normal rate.     Pulses: Normal pulses.     Heart sounds: Normal heart sounds.  Pulmonary:     Effort: Pulmonary effort is normal.     Breath sounds: Normal breath sounds.  Abdominal:     General: There is no distension.     Tenderness: There is no abdominal tenderness.  Musculoskeletal:        General: Deformity (right BKA wiht some blood around surgical site) present. Normal range of motion.     Cervical back: Normal range of motion.  Skin:    General: Skin is warm and dry.  Neurological:     General: No focal deficit present.     Mental Status: He is alert. Mental status is at baseline.     Filed Weights   09/09/20 0830 09/09/20 1150 09/10/20 0500  Weight: 63.6 kg 62.5 kg 63.4 kg     Intake/Output Summary (Last 24 hours) at 09/10/2020 1336 Last data filed at 09/10/2020 0900 Gross per 24 hour  Intake 655 ml  Output 25 ml  Net 630 ml   Net IO Since Admission: 846.67 mL [09/10/20 1336]  Recent Labs    09/10/20 0653 09/10/20 0745 09/10/20 1153  GLUCAP 163* 169* 170*     Pertinent Labs: CBC Latest Ref Rng & Units 09/10/2020 09/09/2020 09/09/2020  WBC 4.0 - 10.5 K/uL 10.9(H) - 10.5  Hemoglobin 13.0 - 17.0 g/dL 8.1(L) 8.5(L) 6.9(LL)   Hematocrit 39.0 - 52.0 % 26.3(L) 26.6(L) 22.7(L)  Platelets 150 - 400 K/uL 363 - 344    CMP Latest Ref Rng & Units 09/10/2020 09/09/2020 09/08/2020  Glucose 70 - 99 mg/dL 193(H) 176(H) 220(H)  BUN 8 - 23 mg/dL 20 38(H) 30(H)  Creatinine 0.61 - 1.24 mg/dL 3.64(H) 6.15(H) 5.35(H)  Sodium 135 - 145 mmol/L 134(L) 137 136  Potassium 3.5 - 5.1 mmol/L 3.6 4.0 3.0(L)  Chloride 98 - 111 mmol/L 94(L) 96(L) 92(L)  CO2 22 - 32 mmol/L 26 26 29   Calcium 8.9 - 10.3 mg/dL 8.5(L) 8.9 9.0  Total Protein 6.5 - 8.1 g/dL - - 6.9  Total Bilirubin 0.3 - 1.2 mg/dL - - 0.6  Alkaline Phos 38 - 126 U/L - - 72  AST 15 - 41 U/L - - 14(L)  ALT 0 - 44 U/L - - 6    Imaging: No results found.  Assessment/Plan:   Active Problems:   S/P BKA (below knee amputation) [facility])   Patient Summary: Dennis Macias is a 70 y.o. M w/ PMHx ESRD on HD, HFrEF, PAD s/p bilateral BKA,  diet-controlled DM, CVA, and HTN, admitted for management of co-morbidities and HD in the post-operative setting after R BKA 09/08/20.  PAD w/ Bilateral BKA's PAD s/p tibail artery revascularization for limb salvage (w/o stent) S/p Failed TMA Patient is POD 2 for Rt BKA and doing well. He is status post 1 unit PRBC with stable 8.1. - Continue to monitor Hgb daily. - Continue current pain management.  - ASA and Plavix for symptomatic PAD  ESRD on HD Hypokalemia, Resolved - Had HD yesterday.  - Electrolytes WNL - Appreciate nephros assistance.   Acute on Chronic Normocytic Anemia Anemia of chronic disease Hypoproliferative anemia Elevated ferritin and a reticulocyte index of 1.75 - S/p Transfused 1 unit pRBC's during HD - Hgb stable at 8.1 - Monitor daily  - On DAPT  Type 2 diabetes: -Frequent CBGs -SSI sensitive  Hypertension: Hyperlipidemia: -Resume home Lipitor 80 mg - On DAPT  Diet: Cardiac diet IVF: PO intake VTE: heparin injection 5,000 Units Start: 09/08/20 1400 Code: Full PT/OT: SNF for Subacute PT    Anticipated  discharge to Skilled nursing facility in 1-2 days pending placement.  Dennis Macias, D.O.  Internal Medicine Resident, PGY-2 [facility]  Pager: [phone] 1:36 PM, 09/10/2020   Please contact the on call pager after 5 pm and on weekends at [phone].

## 2020-09-10 NOTE — Progress Notes (Addendum)
Subjective: Alert seen in bed no complaints said tolerated dialysis yesterday 1.1 L UF  Objective Vital signs in last 24 hours: Vitals:   09/09/20 2236 09/10/20 0400 09/10/20 0500 09/10/20 0746  BP: 119/64 118/69  133/70  Pulse: 74 75  79  Resp: 16 17  19   Temp: 99.1 F (37.3 C) 98.8 F (37.1 C)  98.8 F (37.1 C)  TempSrc: Oral Oral    SpO2: 100% 99%    Weight:   63.4 kg   Height:       Weight change: 2.76 kg   Physical Exam: General: alert chronically elderly male, NAD Heart:RRR, No mrg Lungs: CTA ant. nonlabored  Abdomen: BS+ , soft , NT, ND Extremities:R BKA  Dressing dry/clean,  L BKA no edema  Dialysis Access: RUA AVF positive bruit  Dialysis Orders: TTS -[facility] 4hrs, [place], [id number] (d/t dialysate fluid shortage), EDW 61kg,2K/2Ca  Access:R AVF Mircera149mcg q2wks - last3/1/22 Venofer 100mg  X 10 HD treatments- last 09/06/20  Renvela 800mg  1 tablet TID with meals  Last Labs: Hgb8.9, TSAT17 (08/25/20 OP HD), K3.0, Ca9.0, P6.1 (08/25/20 OP HD), PTH 262 (08/25/20 OP HD), Alb2.5  Problem/Plan: 1. PVD RLE s/p R non-healing TMA/now sp R BKA 09/08/20- Plan/mange per [facility], noted for CIR versus NHP- he thinks he can go home 2. ESRD- on HD TTS. Off Schedule HD MWF in hospital yesterday Friday 3/04, K 3 .6 this AM, .  Next dialysis Monday and possibly Thursday to get back on schedule 3. Hypertension/volume- BP  stable. appears euvolemic, UF as tolerated.  1.1 L yesterday 4. Anemiaof CKD- Hgb drop to 6.9 after yest BKA  , 1 unit prbcs on HD 3/04 , this a.m. Hgb 8.1/due Aranesp 100 mcg next 3/15// Fe on hold for now, patient was receiving ABX, now completed will start Venofer load x5 now , -follow-up Hg trend 5. Secondary Hyperparathyroidism -Ca/phos stable continue binder- renvela.  Last PTH at goal (OP HD), no vit d  6. Nutritionalb 2.4 added Nepro renal diet with fluid restriction  Ernest Haber, PA-C [place]  [phone] 09/10/2020,10:41 AM  LOS: 2 days    Patient seen and examined, agree with above note with above modifications. Pt trying to rest-  Seems annoyed that people keep coming in -  No issues with HD-  dispo is pending but patient thinks that he could go home-  Agree with plan for HD on Monday , then Thursday to get on schedule, most likely will be here til Monday at least  Corliss Parish, MD 09/10/2020     Labs: Basic Metabolic Panel: Recent Labs  Lab 09/08/20 0633 09/09/20 0708 09/10/20 0224  NA 136 137 134*  K 3.0* 4.0 3.6  CL 92* 96* 94*  CO2 29 26 26   GLUCOSE 220* 176* 193*  BUN 30* 38* 20  CREATININE 5.35* 6.15* 3.64*  CALCIUM 9.0 8.9 8.5*  PHOS  --  5.4* 3.3   Liver Function Tests: Recent Labs  Lab 09/08/20 0633 09/10/20 0224  AST 14*  --   ALT 6  --   ALKPHOS 72  --   BILITOT 0.6  --   PROT 6.9  --   ALBUMIN 2.5* 2.4*   No results for input(s): LIPASE, AMYLASE in the last 168 hours. No results for input(s): AMMONIA in the last 168 hours. CBC: Recent Labs  Lab 09/08/20 0633 09/09/20 0708 09/09/20 1544 09/10/20 0224  WBC 9.7 10.5  --  10.9*  HGB 8.9* 6.9* 8.5*  8.1*  HCT 28.9* 22.7* 26.6* 26.3*  MCV 95.1 97.0  --  94.6  PLT 384 344  --  363   Cardiac Enzymes: No results for input(s): CKTOTAL, CKMB, CKMBINDEX, TROPONINI in the last 168 hours. CBG: Recent Labs  Lab 09/09/20 1340 09/09/20 1736 09/09/20 2109 09/10/20 0653 09/10/20 0745  GLUCAP 145* 226* 198* 163* 169*    Studies/Results: No results found. Medications: . sodium chloride 10 mL/hr at 09/08/20 0902  . magnesium sulfate bolus IVPB     . sodium chloride   Intravenous Once  . aspirin EC  81 mg Oral Daily  . atorvastatin  80 mg Oral QHS  . Chlorhexidine Gluconate Cloth  6 each Topical Once   And  . Chlorhexidine Gluconate Cloth  6 each Topical Once  . clopidogrel  75 mg Oral Daily  . docusate sodium  100 mg Oral Daily  . feeding supplement (NEPRO CARB STEADY)  237 mL Oral  BID BM  . finasteride  5 mg Oral Daily  . heparin  5,000 Units Subcutaneous Q8H  . insulin aspart  0-6 Units Subcutaneous TID WC  . pantoprazole  40 mg Oral Daily  . sertraline  25 mg Oral Daily  . sevelamer carbonate  800 mg Oral TID WC

## 2020-09-10 NOTE — Progress Notes (Signed)
Physical Therapy Treatment Patient Details Name: Dennis Macias MRN: [id number] DOB: 01-Aug-1950 Today's Date: 09/10/2020    History of Present Illness Pt is a 70 y/o male with PMH of L BKA, s/p balloon angioplasty of occluded vessels R foot 11/21, L ICA CVA (2017), NIDDM type II with retinopathy, CKD on hemodialysis, nephrotic syndrome, combined systolic and diastolic CHF, CAD w/ NSTEMI, HTN, HLD, tubular adenmoa of colon, tachycardia, syncope, PVD, and protein malnutrition who presents after a syncopal episode at hemodialysis. His R foot wounds continue to worsen with noted dry gangrene on 1st and 2nd toes. now s/p angioplasty of the right posterior tibial artery on 1/27; MRI revealed acute ischemic nonhemorrhagic L thalamic infarct and multiple chronic micro hemorrhages scattered throughout both cerebral hemispheres; R TMA 08/11/20.  s/p R BKA    PT Comments    Patient required max a to sit tup at the edge of the bed and mod a to lie back down. He required mod a to scoot his bottom up the bed. He would benefit from rehab at a SNF. Acute therapy will continue to work on getting him over to a chair.   Follow Up Recommendations  SNF;Supervision/Assistance - 24 hour     Equipment Recommendations  None recommended by PT    Recommendations for Other Services       Precautions / Restrictions Precautions Precautions: Fall Precaution Comments: hx of L BKA; HOH.  New R BKA Restrictions Weight Bearing Restrictions: Yes RLE Weight Bearing: Non weight bearing LLE Weight Bearing: Non weight bearing    Mobility  Bed Mobility Overal bed mobility: Needs Assistance Bed Mobility: Rolling;Supine to Sit;Sit to Supine Rolling: Modified independent (Device/Increase time)   Supine to sit: +2 for physical assistance;Max assist Sit to supine: Mod assist;+2 for physical assistance;+2 for safety/equipment   General bed mobility comments: Maxc a to scoot tot he edge of the bed and to sit up, Mod a at first to  sit up. Once sitting his balance improved. He required mod a to lie back down. He required mod a to scoot up the bed using chucks. Patient declined to get to the chair.    Transfers Overall transfer level: Needs assistance Equipment used: 2 person hand held assist            Lateral/Scoot Transfers: Mod assist General transfer comment: mod a to scoot up the edge of the bed,  Ambulation/Gait                 Stairs             Wheelchair Mobility    Modified Rankin (Stroke Patients Only)       Balance Overall balance assessment: Needs assistance Sitting-balance support: Bilateral upper extremity supported;Feet unsupported Sitting balance-Leahy Scale: Poor Sitting balance - Comments: needed assist to maintian sitting. Able to improve balance with sitting but still required CGA                                    Cognition Arousal/Alertness: Awake/alert Behavior During Therapy: Flat affect Overall Cognitive Status: Impaired/Different from baseline Area of Impairment: Memory;Following commands;Problem solving                     Memory: Decreased short-term memory Following Commands: Follows one step commands with increased time     Problem Solving: Slow processing;Difficulty sequencing;Requires verbal cues General Comments: Pt HOH so requires repetition  to follow commands, pt answering questions with repeated cues. no awareness into having had BM in bed, pt attempting to lay himself back down during session but unable to state why he needs to lay down      Exercises      General Comments General comments (skin integrity, edema, etc.): right residual limb with small amount of bloddy drainage.      Pertinent Vitals/Pain Pain Assessment: Faces Faces Pain Scale: Hurts a little bit Pain Location: R residual limb Pain Descriptors / Indicators: Tender;Grimacing Pain Intervention(s): Limited activity within patient's  tolerance;Monitored during session    [place] expects to be discharged to:: Skilled nursing facility Living Arrangements: Spouse/significant other Available Help at Discharge: Family;Available 24 hours/day Type of Home: Apartment Home Access: Stairs to enter Entrance Stairs-Rails: Right Home Layout: One level Home Equipment: Tub bench;Walker - 2 wheels;Cane - quad;Cane - single point;Bedside commode;Wheelchair - manual Additional Comments: per pt he lives with his girlfriend.  SHe, his son, and daughter provide care as needed.  Girlfriend and son work part time    Prior Function Level of Independence: Needs assistance  Gait / Transfers Assistance Needed: Pt requires assistance for transfers and is carried down/up the stairs in his w/c. ADL's / Homemaking Assistance Needed: Pt requires assistance for bathing and dressing lower half. Pt does not cook or clean. He reports his w/c doesn't fit through BR door so therefore, he sponge bathes. Comments: Patient was at a nursing home   PT Goals (current goals can now be found in the care plan section) Acute Rehab PT Goals Patient Stated Goal: get back to rehab PT Goal Formulation: With patient Time For Goal Achievement: 09/17/20 Potential to Achieve Goals: Fair    Frequency    Min 2X/week      PT Plan      Co-evaluation              AM-PAC PT "6 Clicks" Mobility   Outcome Measure  Help needed turning from your back to your side while in a flat bed without using bedrails?: A Lot Help needed moving from lying on your back to sitting on the side of a flat bed without using bedrails?: A Lot Help needed moving to and from a bed to a chair (including a wheelchair)?: Total Help needed standing up from a chair using your arms (e.g., wheelchair or bedside chair)?: Total Help needed to walk in hospital room?: Total Help needed climbing 3-5 steps with a railing? : Total 6 Click Score: 8    End of Session          PT Visit Diagnosis: Unsteadiness on feet (R26.81);Muscle weakness (generalized) (M62.81);Difficulty in walking, not elsewhere classified (R26.2);Other symptoms and signs involving the nervous system (K80.034)     Time: 9179-1505 PT Time Calculation (min) (ACUTE ONLY): 16 min  Charges:                         Carney Living PT DPT  09/10/2020, 2:59 PM

## 2020-09-10 NOTE — Progress Notes (Signed)
Inpatient Rehab Admissions Coordinator:   CIR consult received. PT/OT recommending that pt. Return to SNF for rehab. It is unlikely that I can get insurance authorization for CIR as well. I notified Pt. That I will not be pursuing CIR admission.   Clemens Catholic, [place], [place] Admissions Coordinator  [phone] ([place]) [phone] (office)

## 2020-09-10 NOTE — Progress Notes (Signed)
Occupational Therapy Treatment Patient Details Name: Dennis Macias MRN: [id number] DOB: [birth year] Today's Date: 09/10/2020    History of present illness Pt is a 70 y/o male with PMH of L BKA, s/p balloon angioplasty of occluded vessels R foot 11/21, L ICA CVA (2017), NIDDM type II with retinopathy, CKD on hemodialysis, nephrotic syndrome, combined systolic and diastolic CHF, CAD w/ NSTEMI, HTN, HLD, tubular adenmoa of colon, tachycardia, syncope, PVD, and protein malnutrition who presents after a syncopal episode at hemodialysis. His R foot wounds continue to worsen with noted dry gangrene on 1st and 2nd toes. now s/p angioplasty of the right posterior tibial artery on 1/27; MRI revealed acute ischemic nonhemorrhagic L thalamic infarct and multiple chronic micro hemorrhages scattered throughout both cerebral hemispheres; R TMA 08/11/20.  s/p R BKA   OT comments  Patient making incremental gains to stated OT goals.  Patient found slid low in the bed with supper tray at bedside.  OT worked on rolling to side lying, supine to sit, edge of bed sitting to wash hands/face prior to eating, and then lateral scoot to head of bed.  Patient continues to lean back heavily, requiring up to max A for balance support.  Patient did do a better job leaning forward to facilitate lateral scoots, but still needing up to Mod A. Patient's tray set up and bed adjusted to improve independence with self feeding.  OT to continue efforts in the acute setting to improve sit balance and slide transfers to increase ADL independence.  SNF continues to be recommended for post acute rehab.     Follow Up Recommendations  SNF;Supervision/Assistance - 24 hour    Equipment Recommendations  None recommended by OT    Recommendations for Other Services      Precautions / Restrictions Precautions Precautions: Fall Precaution Comments: hx of L BKA; HOH.  New R BKA Restrictions Weight Bearing Restrictions: Yes RLE Weight Bearing: Non  weight bearing LLE Weight Bearing: Non weight bearing       Mobility Bed Mobility Overal bed mobility: Needs Assistance Bed Mobility: Supine to Sit;Sit to Supine Rolling: Modified independent (Device/Increase time)   Supine to sit: Max assist Sit to supine: Mod assist   General bed mobility comments: patient requires assist to bring trunk up, mod to max A with sitting balance.  Tactile and verbal cues to lean forward.    Transfers Overall transfer level: Needs assistance Equipment used: 2 person hand held assist Transfers: Lateral/Scoot Transfers          Lateral/Scoot Transfers: Mod assist General transfer comment: mod A to scoot higher in bed.    Balance Overall balance assessment: Needs assistance Sitting-balance support: Bilateral upper extremity supported;Feet unsupported Sitting balance-Leahy Scale: Poor Sitting balance - Comments: needed assist to maintian sitting. Able to improve balance with sitting but still required CGA                                   ADL either performed or assessed with clinical judgement   ADL       Grooming: Wash/dry face;Sitting;Wash/dry hands;Set up;Moderate assistance Grooming Details (indicate cue type and reason): Mod A for edge of bed balance.  Patient able to wash with balance support                             Functional mobility during ADLs: Maximal assistance General ADL  Comments: pt continues to present with impaired sitting balance, decreased ability to care for self and decreased awareness into deficits                       Cognition Arousal/Alertness: Awake/alert Behavior During Therapy: Flat affect Overall Cognitive Status: Impaired/Different from baseline Area of Impairment: Memory;Following commands;Problem solving                     Memory: Decreased short-term memory Following Commands: Follows one step commands with increased time     Problem Solving: Slow  processing;Difficulty sequencing;Requires verbal cues General Comments: Pt HOH so requires repetition to follow commands, pt answering questions with repeated cues. no awareness into having had BM in bed, pt attempting to lay himself back down during session but unable to state why he needs to lay down                     .    Pertinent Vitals/ Pain       Pain Assessment: Faces Faces Pain Scale: Hurts little more Pain Location: R residual limb Pain Descriptors / Indicators: Tender;Grimacing Pain Intervention(s): Monitored during session  Home Living Family/patient expects to be discharged to:: Skilled nursing facility Living Arrangements: Spouse/significant other Available Help at Discharge: Family;Available 24 hours/day Type of Home: Apartment Home Access: Stairs to enter Entrance Stairs-Number of Steps: 6 Entrance Stairs-Rails: Right Home Layout: One level     Bathroom Shower/Tub: Teacher, early years/pre: Handicapped height Bathroom Accessibility: No   Home Equipment: Tub bench;Walker - 2 wheels;Cane - quad;Cane - single point;Bedside commode;Wheelchair - manual   Additional Comments: per pt he lives with his girlfriend.  SHe, his son, and daughter provide care as needed.  Girlfriend and son work part time      Prior Functioning/Environment Level of Independence: Needs assistance  Gait / Transfers Assistance Needed: Pt requires assistance for transfers and is carried down/up the stairs in his w/c. ADL's / Homemaking Assistance Needed: Pt requires assistance for bathing and dressing lower half. Pt does not cook or clean. He reports his w/c doesn't fit through BR door so therefore, he sponge bathes. Communication / Swallowing Assistance Needed: Pt is independent with feeding self. Comments: Patient was at a nursing home   Frequency  Min 2X/week        Progress Toward Goals  OT Goals(current goals can now be found in the care plan section)  Progress  towards OT goals: Progressing toward goals  Acute Rehab OT Goals Patient Stated Goal: get back to rehab OT Goal Formulation: With patient Time For Goal Achievement: 09/23/20 Potential to Achieve Goals: Good  Plan Discharge plan remains appropriate;Frequency remains appropriate    Co-evaluation                 AM-PAC OT "6 Clicks" Daily Activity     Outcome Measure   Help from another person eating meals?: A Little Help from another person taking care of personal grooming?: A Little Help from another person toileting, which includes using toliet, bedpan, or urinal?: Total Help from another person bathing (including washing, rinsing, drying)?: A Lot Help from another person to put on and taking off regular upper body clothing?: A Lot Help from another person to put on and taking off regular lower body clothing?: Total 6 Click Score: 12    End of Session Equipment Utilized During Treatment: Gait belt  OT Visit Diagnosis: Muscle weakness (  generalized) (M62.81);Other symptoms and signs involving cognitive function;Pain Pain - Right/Left: Right Pain - part of body: Leg   Activity Tolerance Patient tolerated treatment well   Patient Left in bed;with call bell/phone within reach;with bed alarm set;with nursing/sitter in room   Nurse Communication          Time: 6701-4103 OT Time Calculation (min): 12 min  Charges: OT General Charges $OT Visit: 1 Visit OT Treatments $Self Care/Home Management : 8-22 mins  09/10/2020  Rich, OTR/L  Acute Rehabilitation Services  Office:  [phone]    Metta Clines 09/10/2020, 4:45 PM

## 2020-09-10 NOTE — Progress Notes (Signed)
   ASSESSMENT & PLAN:  Dennis Macias is a 70 y.o. male status post R BKA on 09/08/20.   PRN pain control PT / OT / OOB HD per nephrology OK to leave stump open to air Disposition to SNF or inpatient rehab  SUBJECTIVE:  No complaints. L BKA hurting occasionally.   OBJECTIVE:  BP 133/70 (BP Location: Left Arm)   Pulse 79   Temp 98.8 F (37.1 C)   Resp 19   Ht 6' (1.829 m)   Wt 63.4 kg   SpO2 99%   BMI 18.96 kg/m   Intake/Output Summary (Last 24 hours) at 09/10/2020 1015 Last data filed at 09/10/2020 0900 Gross per 24 hour  Intake 681.67 ml  Output 1125 ml  Net -443.33 ml    R BKA incision clean and dry L BKA well healed  CBC Latest Ref Rng & Units 09/10/2020 09/09/2020 09/09/2020  WBC 4.0 - 10.5 K/uL 10.9(H) - 10.5  Hemoglobin 13.0 - 17.0 g/dL 8.1(L) 8.5(L) 6.9(LL)  Hematocrit 39.0 - 52.0 % 26.3(L) 26.6(L) 22.7(L)  Platelets 150 - 400 K/uL 363 - 344     CMP Latest Ref Rng & Units 09/10/2020 09/09/2020 09/08/2020  Glucose 70 - 99 mg/dL 193(H) 176(H) 220(H)  BUN 8 - 23 mg/dL 20 38(H) 30(H)  Creatinine 0.61 - 1.24 mg/dL 3.64(H) 6.15(H) 5.35(H)  Sodium 135 - 145 mmol/L 134(L) 137 136  Potassium 3.5 - 5.1 mmol/L 3.6 4.0 3.0(L)  Chloride 98 - 111 mmol/L 94(L) 96(L) 92(L)  CO2 22 - 32 mmol/L 26 26 29   Calcium 8.9 - 10.3 mg/dL 8.5(L) 8.9 9.0  Total Protein 6.5 - 8.1 g/dL - - 6.9  Total Bilirubin 0.3 - 1.2 mg/dL - - 0.6  Alkaline Phos 38 - 126 U/L - - 72  AST 15 - 41 U/L - - 14(L)  ALT 0 - 44 U/L - - 6    Estimated Creatinine Clearance: 17.2 mL/min (A) (by C-G formula based on SCr of 3.64 mg/dL (H)).  [name]. Stanford Breed, MD [facility] Phone Number: [phone] 09/10/2020 10:15 AM

## 2020-09-10 NOTE — Plan of Care (Signed)

## 2020-09-11 LAB — CBC
HCT: 23.8 % — ABNORMAL LOW (ref 39.0–52.0)
Hemoglobin: 7.9 g/dL — ABNORMAL LOW (ref 13.0–17.0)
MCH: 30.6 pg (ref 26.0–34.0)
MCHC: 33.2 g/dL (ref 30.0–36.0)
MCV: 92.2 fL (ref 80.0–100.0)
Platelets: 354 10*3/uL (ref 150–400)
RBC: 2.58 MIL/uL — ABNORMAL LOW (ref 4.22–5.81)
RDW: 15.6 % — ABNORMAL HIGH (ref 11.5–15.5)
WBC: 10.9 10*3/uL — ABNORMAL HIGH (ref 4.0–10.5)
nRBC: 0 % (ref 0.0–0.2)

## 2020-09-11 LAB — RENAL FUNCTION PANEL
Albumin: 2.2 g/dL — ABNORMAL LOW (ref 3.5–5.0)
Anion gap: 16 — ABNORMAL HIGH (ref 5–15)
BUN: 28 mg/dL — ABNORMAL HIGH (ref 8–23)
CO2: 26 mmol/L (ref 22–32)
Calcium: 8.8 mg/dL — ABNORMAL LOW (ref 8.9–10.3)
Chloride: 94 mmol/L — ABNORMAL LOW (ref 98–111)
Creatinine, Ser: 4.89 mg/dL — ABNORMAL HIGH (ref 0.61–1.24)
GFR, Estimated: 12 mL/min — ABNORMAL LOW (ref >60)
Glucose, Bld: 121 mg/dL — ABNORMAL HIGH (ref 70–99)
Phosphorus: 3.9 mg/dL (ref 2.5–4.6)
Potassium: 3.5 mmol/L (ref 3.5–5.1)
Sodium: 136 mmol/L (ref 135–145)

## 2020-09-11 LAB — HEMOGLOBIN AND HEMATOCRIT, BLOOD
HCT: 23.7 % — ABNORMAL LOW (ref 39.0–52.0)
Hemoglobin: 7.7 g/dL — ABNORMAL LOW (ref 13.0–17.0)

## 2020-09-11 LAB — GLUCOSE, CAPILLARY
Glucose-Capillary: 102 mg/dL — ABNORMAL HIGH (ref 70–99)
Glucose-Capillary: 137 mg/dL — ABNORMAL HIGH (ref 70–99)
Glucose-Capillary: 153 mg/dL — ABNORMAL HIGH (ref 70–99)
Glucose-Capillary: 166 mg/dL — ABNORMAL HIGH (ref 70–99)

## 2020-09-11 NOTE — TOC Initial Note (Signed)
Transition of Care [facility]) - Initial/Assessment Note    Patient Details  Name: Dennis Macias MRN: [id number] Date of Birth: 04-06-1951  [facility] CM/SW Contact:    Trula Ore, [place] Phone Number: 09/11/2020, 2:30 PM  Clinical Narrative:                 CSW received consult for possible SNF placement at time of discharge. CSW spoke with patients daughter [name] regarding PT recommendation of SNF placement at time of discharge.Patient expressed understanding of PT recommendation and is agreeable to SNF placement at time of discharge. Patient comes from [facility] short term. Patients daughter would like for patient to go back to [facility] for SNF placement. Patients daughter gave CSW permission to fax out initial referral to [place] area.  Patient has received the COVID vaccines as well as booster.No further questions reported at this time. CSW to continue to follow and assist with discharge planning needs.    Expected Discharge Plan: Skilled Nursing Facility Barriers to Discharge: Continued Medical Work up   Patient Goals and CMS Choice   CMS Medicare.gov Compare Post Acute Care list provided to:: Patient Represenative (must comment) (daughter [name]) Choice offered to / list presented to : Adult Children (daughter [name])  Expected Discharge Plan and Services Expected Discharge Plan: [place] In-house Referral: Clinical Social Work     Living arrangements for the past 2 months: [place]                                      Prior Living Arrangements/Services Living arrangements for the past 2 months: [place] Lives with:: Self,Facility Resident (From [facility]) Patient language and need for interpreter reviewed:: Yes Do you feel safe going back to the place where you live?: No   SNF  Need for Family Participation in Patient Care: Yes (Comment) Care giver support system in place?: Yes (comment)   Criminal Activity/Legal  Involvement Pertinent to Current Situation/Hospitalization: No - Comment as needed  Activities of Daily Living Home Assistive Devices/Equipment: Wheelchair,CBG Meter ADL Screening (condition at time of admission) Patient's cognitive ability adequate to safely complete daily activities?: Yes Is the patient deaf or have difficulty hearing?: No Does the patient have difficulty seeing, even when wearing glasses/contacts?: No Does the patient have difficulty concentrating, remembering, or making decisions?: Yes Patient able to express need for assistance with ADLs?: Yes Does the patient have difficulty dressing or bathing?: Yes Independently performs ADLs?: No Does the patient have difficulty walking or climbing stairs?: Yes Weakness of Legs: Both Weakness of Arms/Hands: None  Permission Sought/Granted Permission sought to share information with : Case Manager,Family Chief Financial Officer Permission granted to share information with : Yes, Verbal Permission Granted  Share Information with NAME: [name]  Permission granted to share info w AGENCY: SNF  Permission granted to share info w Relationship: daughter  Permission granted to share info w Contact Information: [name] [phone]  Emotional Assessment       Orientation: : Oriented to Self,Oriented to Situation Alcohol / Substance Use: Not Applicable Psych Involvement: No (comment)  Admission diagnosis:  S/P BKA (below knee amputation) ([place]) [Z89.519] Patient Active Problem List   Diagnosis Date Noted  . Fever   . Nonerosive esophageal reflux disease 08/02/2020  . Hiatal hernia with GERD and esophagitis 08/02/2020  . Gastroesophageal reflux disease with esophagitis   . Dark stools   .  Stroke ([place]) 07/29/2020  . Syncope 07/28/2020  . Acute cystitis 05/03/2020  . S/P BKA (below knee amputation) ([place]) 03/31/2020  . PAD (peripheral artery disease) ([place]) 02/18/2020  . Orthostatic hypotension 02/18/2020  . Acute  cerebrovascular accident (CVA) due to ischemia ([place]) 02/17/2020  . ESRD (end stage renal disease) ([place])   . Goals of care, counseling/discussion   . Acute on chronic combined systolic and diastolic CHF (congestive heart failure) ([place]) 12/24/2019  . Non-Obstructive CAD 12/14/2019  . BPH (benign prostatic hyperplasia) 12/08/2019  . Anemia of chronic disease   . Protein-calorie malnutrition, severe 11/19/2019  . MSSA bacteremia   . Gait disorder   . Chronic combined systolic and diastolic heart failure ([place])   . Left renal mass 07/21/2019  . CKD (chronic kidney disease) stage 4, GFR 15-29 ml/min (HCC) 04/23/2019  . Weight loss, non-intentional 08/26/2017  . 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 Anemia 12/21/2016  . Hyperlipidemia 09/13/2015  . Diabetes mellitus with retinopathy of both eyes ([place])   . Essential hypertension    PCP:  [name], MD Pharmacy:   [place], [place] - [address] [facility]. [address] [place] [address] Phone: [phone] Fax: [place], [place] - [place] [address] Phone: [phone] Fax: [phone]     Social Determinants of Health (SDOH) Interventions    Readmission Risk Interventions Readmission Risk Prevention Plan 01/19/2020 11/10/2019 08/10/2019  Transportation Screening Complete Complete Complete  PCP or Specialist Appt within 3-5 Days - - Complete  HRI or Ages - - Complete  Social Work Consult for [place] Planning/Counseling - - Complete  Palliative Care Screening - - Complete  Medication Review Press photographer) Complete Complete Complete  PCP or Specialist appointment within 3-5 days of discharge Complete - -  [place] or Home Care Consult Complete Complete -  SW Recovery Care/Counseling Consult Complete Complete -  Palliative Care Screening Complete Not  Applicable -  [place] Complete Complete -  Some recent data might be hidden

## 2020-09-11 NOTE — Progress Notes (Addendum)
Subjective: Resting in bed no complaints, next dialysis tomorrow Monday on schedule  Objective Vital signs in last 24 hours: Vitals:   09/10/20 1608 09/10/20 2024 09/11/20 0500 09/11/20 0809  BP: (!) 158/69 129/70 130/69 127/66  Pulse: 75 79 75 72  Resp: 17 16  18   Temp: 98.7 F (37.1 C) 99.8 F (37.7 C) 98.9 F (37.2 C) 98.7 F (37.1 C)  TempSrc: Oral Oral Oral Oral  SpO2: 99% 99% 98%   Weight:      Height:       Weight change:   Physical Exam: General:alert chronically elderly male, NAD Heart:RRR, No mrg Lungs:CTA ant. nonlabored Abdomen:BS+ , soft , NT, ND Extremities:R BKA incision surgical staples, dry, no edema,  L BKA no edema Dialysis Access:RUA AVF positive bruit  Dialysis Orders: TTS -[facility] 4hrs, [place], [phone] (d/t dialysate fluid shortage), EDW 61kg,2K/2Ca  Access:R AVF Mircera160mcg q2wks - last3/1/22 Venofer 100mg  X 10 HD treatments- last 09/06/20  Renvela 800mg  1 tablet TID with meals  Last Labs: Hgb8.9, TSAT17 (08/25/20 OP HD), K3.0, Ca9.0, P6.1 (08/25/20 OP HD), PTH 262 (08/25/20 OP HD), Alb2.5  Problem/Plan: 1. PVD RLE s/p R non-healing TMA/now sp R BKA 09/08/20- Plan/mange per [facility]VS, noted for CIR versus NHP, patient had thoughts of going home-but noted recommendation by OT PT =SNF  2. ESRD- on HD TTS.OffSchedule HD MWF in hosp. last HD Friday 3/04, a.m. labs K 3 .5 , Next dialysis tomorrow Monday and then Thursday to get back on schedule 3. Hypertension/volume- BPstable. appears euvolemic, UFas tolerated.  1.1 L  03/04 HD 4. Anemiaof CKD- Hgbdrop to 6.9 after BKA , 1 unit prbcs on HD 3/04 , this a.m. Hgb 7.9 /dueAranesp121mcgnext 3/15//Fe on hold for now, patient was receiving ABX, now completed will start Venofer load x5 now , -follow-up Hg trend 5. Secondary Hyperparathyroidism -Ca/phos stablecontinue binder- renvela. Last PTH at goal (OP HD), no vit d 6. Nutritionalb 2.2 added Nepro renal  diet with fluid restriction   Ernest Haber, PA-C [place] [phone] 09/11/2020,9:15 AM  LOS: 3 days    Patient seen and examined, agree with above note with above modifications. No new issues-  Planning for HD tomorrow, then Thursday to get back on schedule.   Appropriate adjustments in HD related meds, dispo after BKA is pending  Corliss Parish, MD 09/11/2020     Labs: Basic Metabolic Panel: Recent Labs  Lab 09/09/20 0708 09/10/20 0224 09/11/20 0130  NA 137 134* 136  K 4.0 3.6 3.5  CL 96* 94* 94*  CO2 26 26 26   GLUCOSE 176* 193* 121*  BUN 38* 20 28*  CREATININE 6.15* 3.64* 4.89*  CALCIUM 8.9 8.5* 8.8*  PHOS 5.4* 3.3 3.9   Liver Function Tests: Recent Labs  Lab 09/08/20 0633 09/10/20 0224 09/11/20 0130  AST 14*  --   --   ALT 6  --   --   ALKPHOS 72  --   --   BILITOT 0.6  --   --   PROT 6.9  --   --   ALBUMIN 2.5* 2.4* 2.2*   No results for input(s): LIPASE, AMYLASE in the last 168 hours. No results for input(s): AMMONIA in the last 168 hours. CBC: Recent Labs  Lab 09/08/20 0633 09/09/20 0708 09/09/20 1544 09/10/20 0224 09/11/20 0130  WBC 9.7 10.5  --  10.9* 10.9*  HGB 8.9* 6.9* 8.5* 8.1* 7.9*  HCT 28.9* 22.7* 26.6* 26.3* 23.8*  MCV 95.1 97.0  --  94.6 92.2  PLT 384 344  --  363 354   Cardiac Enzymes: No results for input(s): CKTOTAL, CKMB, CKMBINDEX, TROPONINI in the last 168 hours. CBG: Recent Labs  Lab 09/10/20 0745 09/10/20 1153 09/10/20 1608 09/10/20 2027 09/11/20 0647  GLUCAP 169* 170* 105* 114* 102*    Studies/Results: No results found. Medications: . sodium chloride 10 mL/hr at 09/08/20 0902  . [START ON 09/12/2020] iron sucrose    . magnesium sulfate bolus IVPB     . sodium chloride   Intravenous Once  . aspirin EC  81 mg Oral Daily  . atorvastatin  80 mg Oral QHS  . Chlorhexidine Gluconate Cloth  6 each Topical Once   And  . Chlorhexidine Gluconate Cloth  6 each Topical Once  . clopidogrel  75  mg Oral Daily  . docusate sodium  100 mg Oral Daily  . feeding supplement (NEPRO CARB STEADY)  237 mL Oral BID BM  . finasteride  5 mg Oral Daily  . heparin  5,000 Units Subcutaneous Q8H  . insulin aspart  0-6 Units Subcutaneous TID WC  . pantoprazole  40 mg Oral Daily  . sertraline  25 mg Oral Daily  . sevelamer carbonate  800 mg Oral TID WC

## 2020-09-11 NOTE — Plan of Care (Signed)
  Problem: Education: Goal: Knowledge of General Education information will improve Description: Including pain rating scale, medication(s)/side effects and non-pharmacologic comfort measures 09/11/2020 0233 by Cephus Shelling, RN Outcome: Progressing 09/11/2020 0231 by Cephus Shelling, RN Outcome: Progressing   Problem: Health Behavior/Discharge Planning: Goal: Ability to manage health-related needs will improve 09/11/2020 0233 by Cephus Shelling, RN Outcome: Progressing 09/11/2020 0231 by Cephus Shelling, RN Outcome: Progressing   Problem: Activity: Goal: Risk for activity intolerance will decrease 09/11/2020 0233 by Cephus Shelling, RN Outcome: Progressing 09/11/2020 0231 by Cephus Shelling, RN Outcome: Progressing   Problem: Pain Managment: Goal: General experience of comfort will improve Outcome: Progressing   Problem: Skin Integrity: Goal: Risk for impaired skin integrity will decrease 09/11/2020 0233 by Cephus Shelling, RN Outcome: Progressing 09/11/2020 0231 by Cephus Shelling, RN Outcome: Progressing

## 2020-09-11 NOTE — Plan of Care (Signed)

## 2020-09-11 NOTE — NC FL2 (Signed)
[place] LEVEL OF CARE SCREENING TOOL     IDENTIFICATION  Patient Name: Dennis Macias Birthdate: [birth year] Sex: male Admission Date (Current Location): 09/08/2020  [facility] and [place] Number:  Herbalist and Address:  [facility] [place], [place] [address], [place], [place] [address]      Provider Number: [id number]  Attending Physician Name and Address:  Sid Falcon, MD  Relative Name and Phone Number:       Current Level of Care:   Recommended Level of Care: [place] Prior Approval Number:    Date Approved/Denied:   PASRR Number: [id number]  Discharge Plan: SNF    Current Diagnoses: Patient Active Problem List   Diagnosis Date Noted  . Fever   . Nonerosive esophageal reflux disease 08/02/2020  . Hiatal hernia with GERD and esophagitis 08/02/2020  . Gastroesophageal reflux disease with esophagitis   . Dark stools   . Stroke ([place]) 07/29/2020  . Syncope 07/28/2020  . Acute cystitis 05/03/2020  . S/P BKA (below knee amputation) ([place]) 03/31/2020  . PAD (peripheral artery disease) ([place]) 02/18/2020  . Orthostatic hypotension 02/18/2020  . Acute cerebrovascular accident (CVA) due to ischemia ([place]) 02/17/2020  . ESRD (end stage renal disease) ([place])   . Goals of care, counseling/discussion   . Acute on chronic combined systolic and diastolic CHF (congestive heart failure) ([place]) 12/24/2019  . Non-Obstructive CAD 12/14/2019  . BPH (benign prostatic hyperplasia) 12/08/2019  . Anemia of chronic disease   . Protein-calorie malnutrition, severe 11/19/2019  . MSSA bacteremia   . Gait disorder   . Chronic combined systolic and diastolic heart failure ([place])   . Left renal mass 07/21/2019  . CKD (chronic kidney disease) stage 4, GFR 15-29 ml/min (HCC) 04/23/2019  . Weight loss, non-intentional 08/26/2017  . 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 Anemia 12/21/2016  . Hyperlipidemia 09/13/2015  . 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 Essential hypertension     Orientation RESPIRATION BLADDER Height & Weight     Self,Situation  Normal Incontinent Weight: 139 lb 12.4 oz (63.4 kg) Height:  6' (182.9 cm)  BEHAVIORAL SYMPTOMS/MOOD NEUROLOGICAL BOWEL NUTRITION STATUS      Incontinent Diet (See discharge summary)  AMBULATORY STATUS COMMUNICATION OF NEEDS Skin   Extensive Assist   Surgical wounds                       Personal Care Assistance Level of Assistance  Bathing,Feeding,Dressing Bathing Assistance: Maximum assistance Feeding assistance: Limited assistance Dressing Assistance: Maximum assistance     Functional Limitations Info  Hearing,Speech,Sight Sight Info: 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) Hearing Info: Adequate Speech Info: Adequate    SPECIAL CARE FACTORS FREQUENCY  PT (By licensed PT),OT (By licensed OT)     PT Frequency: 5x a week OT Frequency: 5x a week            Contractures Contractures Info: Not present    Additional Factors Info  Code Status,Allergies,Insulin Sliding Scale Code Status Info: Full Allergies Info: cefepime   Insulin Sliding Scale Info: Novolog 0-6 unites 3x a day       Current Medications (09/11/2020):  This is the current hospital active medication list Current Facility-Administered Medications  Medication Dose Route Frequency Provider Last Rate Last Admin  . 0.9 %  sodium chloride infusion (Manually program via Guardrails IV Fluids)   Intravenous Once Zeyfang, David, PA-C      . 0.9 %  sodium chloride infusion   Intravenous Continuous [name], MD 10 mL/hr at 09/08/20 0902 Restarted at 09/08/20 1019  . acetaminophen (TYLENOL) tablet 650 mg  650 mg Oral Q6H PRN [name], MD       Or  . acetaminophen (TYLENOL) suppository 650 mg  650 mg Rectal Q6H PRN [name], MD      . aspirin EC tablet 81 mg  81 mg Oral Daily [name], MD   81 mg at 09/11/20 0804  . atorvastatin (LIPITOR) tablet 80 mg   80 mg Oral QHS [name], MD   80 mg at 09/10/20 2139  . Chlorhexidine Gluconate Cloth 2 % PADS 6 each  6 each Topical Once [name], MD       And  . Chlorhexidine Gluconate Cloth 2 % PADS 6 each  6 each Topical Once [name], MD      . clopidogrel (PLAVIX) tablet 75 mg  75 mg Oral Daily [name], MD   75 mg at 09/11/20 0804  . docusate sodium (COLACE) capsule 100 mg  100 mg Oral Daily Rhyne, Samantha J, PA-C   100 mg at 09/11/20 0805  . feeding supplement (NEPRO CARB STEADY) liquid 237 mL  237 mL Oral BID BM Ernest Haber, PA-C   237 mL at 09/11/20 0805  . finasteride (PROSCAR) tablet 5 mg  5 mg Oral Daily [name], MD   5 mg at 09/11/20 0804  . heparin injection 5,000 Units  5,000 Units Subcutaneous Q8H [name], MD   5,000 Units at 09/11/20 [phone]  . HYDROmorphone (DILAUDID) injection 0.5 mg  0.5 mg Intravenous Q4H PRN Rhyne, Samantha J, PA-C   0.5 mg at 09/09/20 0935  . HYDROmorphone (DILAUDID) injection 1-2 mg  1-2 mg Intravenous Q3H PRN Sid Falcon, MD   1 mg at 09/10/20 0244  . insulin aspart (novoLOG) injection 0-6 Units  0-6 Units Subcutaneous TID WC [name], MD   1 Units at 09/10/20 1156  . [START ON 09/12/2020] iron sucrose (VENOFER) 100 mg in sodium chloride 0.9 % 100 mL IVPB  100 mg Intravenous Q M,W,F-HD Zeyfang, David, PA-C      . magnesium sulfate IVPB 2 g 50 mL  2 g Intravenous Daily PRN Rhyne, Samantha J, PA-C      . ondansetron (ZOFRAN) injection 4 mg  4 mg Intravenous Q6H PRN Rhyne, Samantha J, PA-C      . oxyCODONE-acetaminophen (PERCOCET/ROXICET) 5-325 MG per tablet 1-2 tablet  1-2 tablet Oral Q4H PRN Rhyne, Hulen Shouts, PA-C   1 tablet at 09/11/20 0804  . pantoprazole (PROTONIX) EC tablet 40 mg  40 mg Oral Daily Rhyne, Samantha J, PA-C   40 mg at 09/11/20 0805  . phenol (CHLORASEPTIC) mouth spray 1 spray  1 spray Mouth/Throat PRN Rhyne, Samantha J, PA-C      . potassium chloride SA (KLOR-CON) CR tablet  20-40 mEq  20-40 mEq Oral Daily PRN Rhyne, Samantha J, PA-C      . sertraline (ZOLOFT) tablet 25 mg  25 mg Oral Daily [name], MD   25 mg at 09/11/20 0804  . sevelamer carbonate (RENVELA) tablet 800 mg  800 mg Oral TID WC Adelfa Koh, NP   800 mg at 09/11/20 6283     Discharge Medications: Please see discharge summary for a list of discharge medications.  Relevant Imaging Results:  Relevant Lab Results:   Additional Information  [id number]  Emeterio Reeve, LCSWA

## 2020-09-11 NOTE — Progress Notes (Signed)
   Subjective:   Dennis Macias is resting comfortably and denies any pain. Denies fevers, chills, nausea, or any other complaints.   Objective:  Vital signs in last 24 hours: Vitals:   09/10/20 0746 09/10/20 1608 09/10/20 2024 09/11/20 0500  BP: 133/70 (!) 158/69 129/70 130/69  Pulse: 79 75 79 75  Resp: 19 17 16    Temp: 98.8 F (37.1 C) 98.7 F (37.1 C) 99.8 F (37.7 C) 98.9 F (37.2 C)  TempSrc:  Oral Oral Oral  SpO2:  99% 99% 98%  Weight:      Height:       General: Patient appears thin and chronically ill, in no acute distress.  Eyes: Sclera non-icteric. No conjunctival injection.  Respiratory: Lungs are CTA, bilaterally anteriorly. No wheezes, rales, or rhonchi. Saturating well on room air.  Cardiovascular: Regular rate and rhythm. No murmurs, rubs, or gallops. No LE edema. Musculoskeletal: Healed left BKA, healing R BKA. No significant LE tenderness to palpation. Skin: Pallor present. R BKA with staples in place and scattered scabbing although without excessive erythema or active drainage. No other lesions or rashes.  Neurologic: Somnolent but oriented.   Assessment/Plan:  Active Problems:   S/P BKA (below knee amputation) [facility])  Dennis Macias is a 70 y.o. M w/ PMHx ESRD on HD, HFrEF, PVD s/p bilateral BKA, diet-controlled DM, CVA, and HTN, admitted for management of co-morbidities and HD in the post-operative setting after R BKA 09/08/20.  PVD w/ Bilateral BKA's Patient seems to be recovering well from R BKA, POD 3 s/p failed TMA 08/11/20. He did have some bloody drainage surrounding the wound yesterday although hemoglobin down slightly to 7.9 s/p 1 unit PRBC's post-procedure. -Vascular surgery following, appreciate recommendations -OK to leave stump open to air -Continue PT/OT -Tylenol for mild pain -Dilaudid PRN for pain control -Continue ASA and Plavix   ESRD on HD Hypokalemia, Resolved On TTS schedule at home. K replaced. Appears euvolemic. Next HD session is tomorrow  with plan for Thursday next to resume TTS schedule.  -Nephrology following, appreciate their recommendations -Continue Aranesp, Venofer load, binder-renvela -Hold iron -Renal diet with fluid restriction -Continue to monitor renal function  Acute on Chronic Normocytic Anemia Hgb dropped 2 pts, < 7 following BKA. Continues to drop slowly after 1 unit RBC's yesterday. -Check STAT H&H  -Transfuse for goal > 8, although does have HD tomorrow   Type 2 diabetes: Patient is not on any medications at home at this time. CBG's improved into target range today. -Frequent CBGs -SSI sensitive  Hypertension: Hyperlipidemia: Patient has a history of hypertension, not on any home medications at this time. Blood pressures more stable, controlled. -Resume home Lipitor 80 mg  Anticipated Discharge Location: SNF Barriers to Discharge: Healing of BKA Dispo: Pending wound healing, Hgb stability  Code Status: Full Code DVT PPx: Heparin Diet: Renal w/ Fluid Restriction 1.2L  IVF: None   Jeralyn Bennett, MD 09/11/2020, 7:53 AM Pager: [phone] After 5pm on weekdays and 1pm on weekends: On Call pager [phone]

## 2020-09-11 NOTE — Plan of Care (Signed)
  Problem: Education: Goal: Knowledge of General Education information will improve Description: Including pain rating scale, medication(s)/side effects and non-pharmacologic comfort measures Outcome: Progressing   Problem: Health Behavior/Discharge Planning: Goal: Ability to manage health-related needs will improve Outcome: Progressing   Problem: Activity: Goal: Risk for activity intolerance will decrease Outcome: Progressing   Problem: Pain Managment: Goal: General experience of comfort will improve Outcome: Progressing   Problem: Skin Integrity: Goal: Risk for impaired skin integrity will decrease Outcome: Progressing   

## 2020-09-12 DIAGNOSIS — E1151 Type 2 diabetes mellitus with diabetic peripheral angiopathy without gangrene: Secondary | ICD-10-CM | POA: Diagnosis not present

## 2020-09-12 DIAGNOSIS — F32A Depression, unspecified: Secondary | ICD-10-CM | POA: Diagnosis not present

## 2020-09-12 DIAGNOSIS — Z89511 Acquired absence of right leg below knee: Secondary | ICD-10-CM | POA: Diagnosis not present

## 2020-09-12 DIAGNOSIS — I10 Essential (primary) hypertension: Secondary | ICD-10-CM | POA: Diagnosis not present

## 2020-09-12 LAB — RENAL FUNCTION PANEL
Albumin: 2.1 g/dL — ABNORMAL LOW (ref 3.5–5.0)
Anion gap: 17 — ABNORMAL HIGH (ref 5–15)
BUN: 44 mg/dL — ABNORMAL HIGH (ref 8–23)
CO2: 24 mmol/L (ref 22–32)
Calcium: 8.6 mg/dL — ABNORMAL LOW (ref 8.9–10.3)
Chloride: 93 mmol/L — ABNORMAL LOW (ref 98–111)
Creatinine, Ser: 6.55 mg/dL — ABNORMAL HIGH (ref 0.61–1.24)
GFR, Estimated: 9 mL/min — ABNORMAL LOW (ref >60)
Glucose, Bld: 115 mg/dL — ABNORMAL HIGH (ref 70–99)
Phosphorus: 5.6 mg/dL — ABNORMAL HIGH (ref 2.5–4.6)
Potassium: 4.1 mmol/L (ref 3.5–5.1)
Sodium: 134 mmol/L — ABNORMAL LOW (ref 135–145)

## 2020-09-12 LAB — CBC WITH DIFFERENTIAL/PLATELET
Abs Immature Granulocytes: 0.07 10*3/uL (ref 0.00–0.07)
Basophils Absolute: 0 10*3/uL (ref 0.0–0.1)
Basophils Relative: 0 %
Eosinophils Absolute: 0 10*3/uL (ref 0.0–0.5)
Eosinophils Relative: 0 %
HCT: 24.3 % — ABNORMAL LOW (ref 39.0–52.0)
Hemoglobin: 7.8 g/dL — ABNORMAL LOW (ref 13.0–17.0)
Immature Granulocytes: 1 %
Lymphocytes Relative: 17 %
Lymphs Abs: 1.6 10*3/uL (ref 0.7–4.0)
MCH: 30.1 pg (ref 26.0–34.0)
MCHC: 32.1 g/dL (ref 30.0–36.0)
MCV: 93.8 fL (ref 80.0–100.0)
Monocytes Absolute: 0.7 10*3/uL (ref 0.1–1.0)
Monocytes Relative: 7 %
Neutro Abs: 7.4 10*3/uL (ref 1.7–7.7)
Neutrophils Relative %: 75 %
Platelets: 387 10*3/uL (ref 150–400)
RBC: 2.59 MIL/uL — ABNORMAL LOW (ref 4.22–5.81)
RDW: 15.8 % — ABNORMAL HIGH (ref 11.5–15.5)
WBC: 9.9 10*3/uL (ref 4.0–10.5)
nRBC: 0 % (ref 0.0–0.2)

## 2020-09-12 LAB — GLUCOSE, CAPILLARY
Glucose-Capillary: 119 mg/dL — ABNORMAL HIGH (ref 70–99)
Glucose-Capillary: 110 mg/dL — ABNORMAL HIGH (ref 70–99)
Glucose-Capillary: 144 mg/dL — ABNORMAL HIGH (ref 70–99)
Glucose-Capillary: 90 mg/dL (ref 70–99)

## 2020-09-12 LAB — SARS CORONAVIRUS 2 (TAT 6-24 HRS): SARS Coronavirus 2: NEGATIVE

## 2020-09-12 MED ORDER — HYDROMORPHONE HCL 1 MG/ML IJ SOLN
INTRAMUSCULAR | Status: AC
  Filled 2020-09-12: qty 1

## 2020-09-12 MED ORDER — HYDROMORPHONE HCL 2 MG PO TABS: 1.0000 mg | ORAL_TABLET | ORAL | Status: DC | PRN

## 2020-09-12 MED ORDER — POLYETHYLENE GLYCOL 3350 17 G PO PACK: 17.0000 g | PACK | Freq: Every day | ORAL | Status: DC | PRN

## 2020-09-12 NOTE — TOC Progression Note (Signed)
Transition of Care [facility]) - Progression Note    Patient Details  Name: Dennis Macias MRN: [id number] Date of Birth: 22-Oct-1950  Transition of Care [facility]) CM/SW [place], [place] Phone Number: 09/12/2020, 1:16 PM  Clinical Narrative:    CSW spoke with MD who noted that pt could DC today. Covid test was ordered. Pt can return to [facility], but facility will have to start a new insurance auth. CSW will follow for DC today, or tomorrow pending barriers.   Expected Discharge Plan: Skilled Nursing Facility Barriers to Discharge: Continued Medical Work up  Expected Discharge Plan and Services Expected Discharge Plan: [place] In-house Referral: Clinical Social Work     Living arrangements for the past 2 months: [place]                                       Social Determinants of Health (SDOH) Interventions    Readmission Risk Interventions Readmission Risk Prevention Plan 01/19/2020 11/10/2019 08/10/2019  Transportation Screening Complete Complete Complete  PCP or Specialist Appt within 3-5 Days - - Complete  HRI or Home Care Consult - - Complete  Social Work Consult for [place] Planning/Counseling - - Complete  Palliative Care Screening - - Complete  Medication Review Press photographer) Complete Complete Complete  PCP or Specialist appointment within 3-5 days of discharge Complete - -  [name] or Home Care Consult Complete Complete -  SW Recovery Care/Counseling Consult Complete Complete -  Palliative Care Screening Complete Not Applicable -  [place] Complete Complete -  Some recent data might be hidden

## 2020-09-12 NOTE — Progress Notes (Signed)
HD#4 Subjective:  Overnight Events: no overnight events.   Resting comfortably in bed.  He was seen in dialysis seem to be tolerating it well.  He denied any acute complaints.  Objective:  Vital signs in last 24 hours: Vitals:   09/12/20 1054 09/12/20 1100 09/12/20 1125 09/12/20 1527  BP: (!) 94/57 119/64 93/60 108/63  Pulse: 89 88 87 78  Resp: 18  18 18   Temp:  98.6 F (37 C) 98.9 F (37.2 C) 98.9 F (37.2 C)  TempSrc:  Oral Oral Oral  SpO2: 97%  91% 96%  Weight: 60.9 kg     Height:       Supplemental O2: Room Air SpO2: 96 % O2 Flow Rate (L/min): 1 L/min   Physical Exam:  Physical Exam Constitutional:      Appearance: Normal appearance.  HENT:     Head: Normocephalic and atraumatic.  Eyes:     Extraocular Movements: Extraocular movements intact.  Cardiovascular:     Rate and Rhythm: Normal rate.     Pulses: Normal pulses.     Heart sounds: Normal heart sounds.  Pulmonary:     Effort: Pulmonary effort is normal.     Breath sounds: Normal breath sounds.  Abdominal:     General: Bowel sounds are normal. There is no distension.     Palpations: Abdomen is soft.     Tenderness: There is no abdominal tenderness.  Musculoskeletal:        General: Deformity (Right  and left BKA) present. Normal range of motion.     Cervical back: Normal range of motion.  Skin:    General: Skin is warm and dry.  Neurological:     Mental Status: He is alert and oriented to person, place, and time. Mental status is at baseline.  Psychiatric:        Mood and Affect: Mood normal.     Filed Weights   09/10/20 0500 09/12/20 0715 09/12/20 1054  Weight: 63.4 kg 62.4 kg 60.9 kg     Intake/Output Summary (Last 24 hours) at 09/12/2020 1659 Last data filed at 09/12/2020 1500 Gross per 24 hour  Intake 1415.88 ml  Output 1500 ml  Net -84.12 ml   Net IO Since Admission: 942.55 mL [09/12/20 1659]  Recent Labs    09/12/20 0617 09/12/20 1157 09/12/20 1619  GLUCAP 119* 110* 144*      Pertinent Labs: CBC Latest Ref Rng & Units 09/12/2020 09/11/2020 09/11/2020  WBC 4.0 - 10.5 K/uL 9.9 - 10.9(H)  Hemoglobin 13.0 - 17.0 g/dL 7.8(L) 7.7(L) 7.9(L)  Hematocrit 39.0 - 52.0 % 24.3(L) 23.7(L) 23.8(L)  Platelets 150 - 400 K/uL 387 - 354    CMP Latest Ref Rng & Units 09/12/2020 09/11/2020 09/10/2020  Glucose 70 - 99 mg/dL 115(H) 121(H) 193(H)  BUN 8 - 23 mg/dL 44(H) 28(H) 20  Creatinine 0.61 - 1.24 mg/dL 6.55(H) 4.89(H) 3.64(H)  Sodium 135 - 145 mmol/L 134(L) 136 134(L)  Potassium 3.5 - 5.1 mmol/L 4.1 3.5 3.6  Chloride 98 - 111 mmol/L 93(L) 94(L) 94(L)  CO2 22 - 32 mmol/L 24 26 26   Calcium 8.9 - 10.3 mg/dL 8.6(L) 8.8(L) 8.5(L)  Total Protein 6.5 - 8.1 g/dL - - -  Total Bilirubin 0.3 - 1.2 mg/dL - - -  Alkaline Phos 38 - 126 U/L - - -  AST 15 - 41 U/L - - -  ALT 0 - 44 U/L - - -    Imaging: No results found.  Assessment/Plan:  Active Problems:   S/P BKA (below knee amputation) [facility])   Patient Summary: Dennis Macias is a 70 y.o. M w/ PMHx ESRD on HD, HFrEF, PVD s/p bilateral BKA, diet-controlled DM, CVA, and HTN, admitted for management of co-morbidities and HD in the post-operative setting after R BKA 09/08/20.  PVD w/ Bilateral BKA's Patient is POD 4 for right BKA.  Pain well controlled.  No erythema or drainage from his surgical site.   -Appreciate vascular surgery's assistance with this patient -We will continue to leave BKA stump open to air -Continue PT/OT with plan for SNF for subacute PT and discharge tomorrow. -Continue pain management with Tylenol and Dilaudid as needed  -Continue ASA and Plavix  ESRD on HD Hypokalemia, Resolved Tolerated hemodialysis today.  We will continue TTS scheduling with plan to discharge tomorrow. -Appreciate nephrology's assistance  Acute on Chronic Normocytic Anemia Hemoglobin remained stable we will continue to monitor daily. -Transfuse for goal > 8  Type 2 diabetes: -CBG monitoring -SSI  sensitive  Hypertension: Hyperlipidemia: -Continue Lipitor 80 mg daily.   Diet: Renal diet IVF: PO intake VTE: heparin injection 5,000 Units Start: 09/08/20 1400 Code: Full PT/OT: SNF for Subacute PT    Anticipated discharge to Skilled nursing facility tomorrow pending transfer.  Lawerance Cruel, D.O.  Internal Medicine Resident, PGY-2 [facility]  Pager: [phone] 4:59 PM, 09/12/2020   Please contact the on call pager after 5 pm and on weekends at [phone].

## 2020-09-12 NOTE — Progress Notes (Signed)
Report given to hemodialysis nurse. Informed nurse that lab was not able to draw labs on this patient this morning and will need to be drawn in dialysis.

## 2020-09-12 NOTE — Discharge Summary (Addendum)
Name: Dennis Macias MRN: [id number] DOB: 1950/08/14 70 y.o. PCP: [name], MD  Date of Admission: 09/08/2020  5:27 AM Date of Discharge: 09/13/20 Attending Physician: Sid Falcon, MD  Discharge Diagnosis: 1. Severe PVD s/p Right BKA  2. ESRD on HD 3. Type II DM 4. Hypertension  5. Hyperlipidemia  6. HFrEF  7. Depression  Discharge Medications: Allergies as of 09/13/2020      Reactions   Cefepime Other (See Comments)   Pt had BAD encephalopathy from Cefepime      Medication List    STOP taking these medications   mirtazapine 15 MG tablet Commonly known as: REMERON     TAKE these medications   Accu-Chek FastClix Lancets Misc Check blood sugar up to 7 times a week as instructed   Accu-Chek Guide test strip Generic drug: glucose blood Check blood sugar up to 7 times a week as instructed   Accu-Chek Guide w/Device Kit 1 each by Does not apply route daily. Check blood sugar as instructed up to 7 times a week   acetaminophen 325 MG tablet Commonly known as: TYLENOL Take 2 tablets (650 mg total) by mouth every 6 (six) hours as needed for mild pain or moderate pain (or Fever >/= 101). What changed:   medication strength  how much to take  reasons to take this   aspirin EC 81 MG tablet Take 1 tablet (81 mg total) by mouth daily.   atorvastatin 80 MG tablet Commonly known as: LIPITOR Take 1 tablet (80 mg total) by mouth at bedtime. IM program   clopidogrel 75 MG tablet Commonly known as: Plavix Take 1 tablet (75 mg total) by mouth daily.   Darbepoetin Alfa 40 MCG/0.4ML Sosy injection Commonly known as: ARANESP Inject 0.4 mLs (40 mcg total) into the vein every Saturday with hemodialysis.   dorzolamide-timolol 22.3-6.8 MG/ML ophthalmic solution Commonly known as: COSOPT Place 1 drop into the right eye 2 (two) times daily.   finasteride 5 MG tablet Commonly known as: PROSCAR Take 1 tablet (5 mg total) by mouth daily.   lidocaine-prilocaine  cream Commonly known as: EMLA Apply 1 application topically daily as needed (prior to fistula use).   oxyCODONE 5 MG immediate release tablet Commonly known as: Roxicodone Take 1 tablet (5 mg total) by mouth every 4 (four) hours as needed for up to 3 days for severe pain or breakthrough pain (Take 1-2 tablets as needed every 4 hours for severe pain.).   pantoprazole 40 MG tablet Commonly known as: PROTONIX Take 1 tablet (40 mg total) by mouth daily.   polyethylene glycol 17 g packet Commonly known as: MIRALAX / GLYCOLAX Take 17 g by mouth daily as needed for moderate constipation.   sertraline 25 MG tablet Commonly known as: ZOLOFT Take 25 mg by mouth daily.   sevelamer carbonate 800 MG tablet Commonly known as: RENVELA Take 800 mg by mouth See admin instructions. Take 800 mg by mouth three times a day with food on Sun/Mon/Wed/Fri and two times a day on Tues/Thurs/Sat   vitamin B-12 500 MCG tablet Commonly known as: CYANOCOBALAMIN Take 500 mcg by mouth daily.            Discharge Care Instructions  (From admission, onward)         Start     Ordered   09/13/20 0000  Discharge wound care:       Comments: Please keep RLE wound clean and dry.   09/13/20 1400  Disposition and follow-up:   Dennis Macias was discharged from [facility] in Stable condition.  At the hospital follow up visit please address:  1.   R BKA - Please expect call from [facility] to schedule follow up appointment for staple removal and assessment. Keep clean and dry and continue to monitor wound healing. Short course Roxicodone and Tylenol PRN for pain.    ESRD on HD TTS - Last HD session 09/12/20. Plan for resumption of usual TTS schedule this Thursday. Home medications continued.    Diet-Controlled DM, HTN, HLD - will need close monitoring in outpatient setting for proper wound healing.  2.  Labs / imaging needed at time of follow-up: CBC, BMP  3.   Pending labs/ test needing follow-up: None   Follow-up Appointments:  Follow-up Information    [facility] In 4 weeks.   Specialty: Vascular Surgery Why: Office will call you to arrange your appt (sent) Contact information: [place] [facility] Course by problem list:  PVD s/p Right BKA 09/08/20 Patient failed TMA 08/11/20. He was admitted for medical management after [name] performed a right BKA 09/08/20. No acute complications occurred post-op other than worsening of chronic anemia, requiring single RBC transfusion during HD session. Wound was closed with staples which remain in place on discharge. Would healing well with pain controlled on PRN IV Dilaudid. Will discharge home with 3 day course of Roxicodone and Tylenol as needed for pain. Vascular will be reaching out to patient to schedule follow up for suture removal and further assessment. DAPT resumed during hospital stay for discharge.   ESRD on HD Patient tolerated dialysis sessions well, without complications. Last dialysis session was 09/12/20 in the am. Plan is for resumption of previous TTS dialysis schedule. Potassium was low during hospitalization although normalized. Clinically euvolemic throughout stay. Discharged on home medications.   Acute on Chronic Normocytic Anemia  Hgb dropped 2 pts, < 7 following BKA. Did receive RBC transfusion during HD session although this is likely in the post-operative setting. No active CP or bleeding noted. Resumed DAPT.  Type 2 diabetes Patient has DM that is currently diet-controlled outpatient. Sugars well controlled on SSI insulin in the hospital.   Patient received COVID-19 booster vaccine 09/13/20.  Discharge Subjective Patient endorsing left leg ("foot") pain. Discussed with him that we would be prescribing pain medications once he is discharged to his facility that he may request.  Denies fevers, chills, CP, SOB or any other symptoms.  Discharge Exam:   BP 117/64 (BP Location: Left Arm)   Pulse 70   Temp 98.8 F (37.1 C) (Oral)   Resp 17   Ht 6' (1.829 m)   Wt 60.9 kg   SpO2 100%   BMI 18.21 kg/m  Discharge exam:  General: Patient appears thin and chronically ill, although resting comfortably in no acute distress.  Eyes: Sclera non-icteric. No conjunctival injection.  Respiratory: Lungs are CTA, bilaterally anteriorly. No wheezes, rales, or rhonchi. Saturating well on room air.  Cardiovascular: Regular rate and rhythm. No murmurs, rubs, or gallops. No LE edema. Musculoskeletal: Healed left BKA, new R BKA. Abdominal: Firm but without tenderness or guarding.  Skin: Pallor present. R BKA with well-formed scabs and no active drainage or excessive drainage, warmth or swelling surrounding stapled incision. Healed scar of LLE stump. Neurologic: Awake and oriented Psychiatric: Pleasant and  cooperative.  Pertinent Labs, Studies, and Procedures:   R BKA Procedure Note  DESCRIPTION OF PROCEDURE: After identification of the patient in the pre-operative holding area, the patient was transferred to the operating room. The patient was positioned supine on the operating room table. Anesthesia was induced. The right leg was prepped and draped in standard fashion. A surgical pause was performed confirming correct patient, procedure, and operative location.  A sterile tourniquet was placed on the right thigh. The skin of the leg was marked to plan the anterior incision 10 cm distal to the tibial tuberosity and then the marked out a posterior flap that was one third of the circumference of the calf in length.   I then exsanguinated the leg with a Esmarch bandage and then inflated the pneumatic tourniquet to 250 mm Hg. I made the incisions for these flaps, and then dissected through the subcutaneous tissue, fascia, and muscle anteriorly with electrocautery.  I also similarly  developed a thick posterior flap of muscle with electrocautery. The tourniquet did not achieve hemostasis. I elevated  the periosteal tissue superiorly so that the tibia was about 3 cm shorter than the anterior skin flap.  I then transected the tibia with a power saw and then took a wedge off the tibia anteriorly with the power saw.  Then I smoothed out the rough edges with a rasp.  In a similar fashion, I cut back the fibula about two centimeters higher than the level of the tibia with a bone cutter.  I then finished releasing the posterior muscle flap with electrocautery.  The tibial vessels were heavily calcified and quite difficult to suture ligate.  At this point, the specimen was passed off the field as the below-the-knee amputation.  At this point, I clamped all visibly bleeding arteries and veins using a combination of suture ligation with Vicryl and electrocautery.  The tourniquet was then deflated at this point.  Bleeding continued to be controlled with electrocautery and suture ligature.  The stump was washed off with sterile normal saline and no further active bleeding was noted.    I reapproximated the anterior and posterior fascia  with interrupted stitches of 2-0 Vicryl.  This was completed along the entire length of anterior and posterior fascia until there were no more loose space in the fascial line.  The skin was then reapproximated with staples.  The stump was washed off and dried.    The incision was dressed and an ACE wrap was applied.    Upon completion of the case instrument and sharps counts were confirmed correct. The patient was transferred to the PACU in good condition. I was present for all portions of the procedure.  [name]. Stanford Breed, MD [facility] Phone Number: [phone] 09/08/2020 10:27 AM  Discharge Labs:  Hgb 7.8 WBC 9.9 Albumin 2.1 Glucose 115  Discharge Instructions: Discharge Instructions    (HEART FAILURE  PATIENTS) Call MD:  Anytime you have any of the following symptoms: 1) 3 pound weight gain in 24 hours or 5 pounds in 1 week 2) shortness of breath, with or without a dry hacking cough 3) swelling in the hands, feet or stomach 4) if you have to sleep on extra pillows at night in order to breathe.   Complete by: As directed    Call MD for:  extreme fatigue   Complete by: As directed    Call MD for:  persistant dizziness or light-headedness   Complete by: As  directed    Call MD for:  persistant nausea and vomiting   Complete by: As directed    Call MD for:  redness, tenderness, or signs of infection (pain, swelling, redness, odor or green/yellow discharge around incision site)   Complete by: As directed    Call MD for:  severe uncontrolled pain   Complete by: As directed    Call MD for:  temperature >100.4   Complete by: As directed    Diet - low sodium heart healthy   Complete by: As directed    Diet Carb Modified   Complete by: As directed    Discharge instructions   Complete by: As directed    Dennis Macias, Dennis Macias were admitted to the hospital for medical management and healing of your right leg amputation. Your wound has been healing very well. Your other medical conditions have remained under good condition.   You did receive a unit of blood for low blood counts during hemodialysis, although you haven't had any bleeding complications from your surgery.   You will receive a call from [facility] in [place] to schedule a follow up appointment (in about 4 weeks) for staple removal and further assessment of your wound. Your wound will need to be kept clean and dry in the meantime. If you have any questions, please call [phone].  I have prescribed a short course of Oxycodone for pain. You may take up to 2 pills every 4 hours as needed for severe pain that is not controlled by 613m of tylenol q6 hours PRN. Please only use as directed.  You have also gotten a  COVID-19 booster shot.  Please continue your current medications and return to the ED if you experience fevers, chills, increased redness, swelling, warmth, or drainage around the surgical site, or any other concerns.   Take care,  Dr. SKonrad Penta  Discharge wound care:   Complete by: As directed    Please keep RLE wound clean and dry.   Increase activity slowly   Complete by: As directed       Signed: SJeralyn Bennett MD 09/13/2020, 2:01 PM   Pager: [phone]

## 2020-09-12 NOTE — Progress Notes (Addendum)
[facility] Progress Note   Subjective: Seen on HD via AVF. No C/Os.  Objective Vitals:   09/12/20 0715 09/12/20 0722 09/12/20 0730 09/12/20 0800  BP: 136/68 140/70 124/69 120/65  Pulse: 67 66 68 71  Resp:      Temp: 98.7 F (37.1 C)     TempSrc: Oral     SpO2: 98%     Weight: 62.4 kg     Height:       Physical Exam General: Chronically ill appearing male in NAD Heart:S1,S2 RRR Lungs:CTAB Anteriorly. No WOB Abdomen: S, NT. Has condom cath scant amount dark tea colored urine Extremities: Bilateral BKA. R stump suture line intact with dried blood along suture lines. No stump edema Dialysis Access: R AVF blood lines connected.     Additional Objective Labs: Basic Metabolic Panel: Recent Labs  Lab 09/10/20 0224 09/11/20 0130 09/12/20 0728  NA 134* 136 134*  K 3.6 3.5 4.1  CL 94* 94* 93*  CO2 26 26 24   GLUCOSE 193* 121* 115*  BUN 20 28* 44*  CREATININE 3.64* 4.89* 6.55*  CALCIUM 8.5* 8.8* 8.6*  PHOS 3.3 3.9 5.6*   Liver Function Tests: Recent Labs  Lab 09/08/20 0633 09/10/20 0224 09/11/20 0130 09/12/20 0728  AST 14*  --   --   --   ALT 6  --   --   --   ALKPHOS 72  --   --   --   BILITOT 0.6  --   --   --   PROT 6.9  --   --   --   ALBUMIN 2.5* 2.4* 2.2* 2.1*   No results for input(s): LIPASE, AMYLASE in the last 168 hours. CBC: Recent Labs  Lab 09/08/20 0633 09/09/20 0708 09/09/20 1544 09/10/20 0224 09/11/20 0130 09/11/20 1825 09/12/20 0729  WBC 9.7 10.5  --  10.9* 10.9*  --  9.9  NEUTROABS  --   --   --   --   --   --  7.4  HGB 8.9* 6.9*   < > 8.1* 7.9* 7.7* 7.8*  HCT 28.9* 22.7*   < > 26.3* 23.8* 23.7* 24.3*  MCV 95.1 97.0  --  94.6 92.2  --  93.8  PLT 384 344  --  363 354  --  387   < > = values in this interval not displayed.   Blood Culture    Component Value Date/Time   SDES BLOOD LEFT HAND 08/04/2020 1300   SDES BLOOD LEFT HAND 08/04/2020 1300   SPECREQUEST  08/04/2020 1300    BOTTLES DRAWN AEROBIC AND ANAEROBIC  Blood Culture adequate volume   SPECREQUEST  08/04/2020 1300    BOTTLES DRAWN AEROBIC AND ANAEROBIC Blood Culture adequate volume   CULT  08/04/2020 1300    NO GROWTH 5 DAYS Performed at [facility], [place] [address], [place], [place] [address]    CULT  08/04/2020 1300    NO GROWTH 5 DAYS Performed at [facility], [place] [address], [place], [place] [address]    REPTSTATUS 08/09/2020 FINAL 08/04/2020 1300   REPTSTATUS 08/09/2020 FINAL 08/04/2020 1300    Cardiac Enzymes: No results for input(s): CKTOTAL, CKMB, CKMBINDEX, TROPONINI in the last 168 hours. CBG: Recent Labs  Lab 09/11/20 0647 09/11/20 1249 09/11/20 1733 09/11/20 2130 09/12/20 0617  GLUCAP 102* 137* 153* 166* 119*   Iron Studies:  Recent Labs    09/10/20 0625  FERRITIN 709*   @lablastinr3 @ Studies/Results: No results  found. Medications: . sodium chloride 10 mL/hr at 09/08/20 0902  . iron sucrose    . magnesium sulfate bolus IVPB     . sodium chloride   Intravenous Once  . aspirin EC  81 mg Oral Daily  . atorvastatin  80 mg Oral QHS  . Chlorhexidine Gluconate Cloth  6 each Topical Once   And  . Chlorhexidine Gluconate Cloth  6 each Topical Once  . clopidogrel  75 mg Oral Daily  . docusate sodium  100 mg Oral Daily  . feeding supplement (NEPRO CARB STEADY)  237 mL Oral BID BM  . finasteride  5 mg Oral Daily  . heparin  5,000 Units Subcutaneous Q8H  . insulin aspart  0-6 Units Subcutaneous TID WC  . pantoprazole  40 mg Oral Daily  . sertraline  25 mg Oral Daily  . sevelamer carbonate  800 mg Oral TID WC     Dialysis Orders: [facility] T,Th, S 4hrs, BFR400/500 EDW 61kg,2K/2Ca R AVF -No heparin  -Mircera128mcg IV q2wks - last3/1/22 -Venofer 100mg  IV X 10 HD treatments- last 09/06/20  -Renvela 800mg  1 tablet PO TID with meals  Last Labs: Hgb8.9, TSAT17 (08/25/20 OP HD), K3.0, Ca9.0, P6.1 (08/25/20 OP HD), PTH 262 (08/25/20 OP HD),  Alb2.5  Assessment/Plan: 1. PVD RLE s/p R non-healing TMA/now sp R BKA 09/08/20- Plan/mange per [facility],noted for CIR versus SNF, patient had thoughts of going home-but noted recommendation by OT PT.   2. ESRD- on HD TTS.OffSchedule HDMWF in hosp. last HD Friday. HD today off schedule. Short treatment tomorrow to resume T,Th,S schedule.  3. Hypertension/volume- BPstable. appears euvolemic, UFas tolerated. 4. Anemiaof CKD- Hgbdown to 6.9 afterBKA 09/09/20. S/P 1 unit of PRBCs. HGB 7.8 09/12/20.  5. Secondary Hyperparathyroidism -Ca/phos stablecontinue binder- renvela. Last PTH at goal (OP HD), No VDRA 6.  Nutrition-PCM. Renal diet-add prosource.   Yuka Lallier H. Shekia Kuper NP-C 09/12/2020, 8:32 AM  [employer] [phone]

## 2020-09-12 NOTE — Progress Notes (Signed)
Seen in HD. No complaints. Amputation healing well. PT/OT/OOB. Needs disposition to SNF / inpatient rehab  [name]. Stanford Breed, MD [facility] Phone Number: [phone] 09/12/2020 11:56 AM

## 2020-09-12 NOTE — Progress Notes (Signed)
PT Cancellation Note  Patient Details Name: Dennis Macias MRN: [id number] DOB: Mar 16, 1951   Cancelled Treatment:    Reason Eval/Treat Not Completed: Patient at procedure or test/unavailable. Pt off floor at HD. PT to return as able to progress mobility.  Kittie Plater, PT, DPT Acute Rehabilitation Services Pager #: [phone] Office #: [phone]    Berline Lopes 09/12/2020, 8:59 AM

## 2020-09-13 DIAGNOSIS — E1151 Type 2 diabetes mellitus with diabetic peripheral angiopathy without gangrene: Secondary | ICD-10-CM | POA: Diagnosis not present

## 2020-09-13 DIAGNOSIS — F32A Depression, unspecified: Secondary | ICD-10-CM | POA: Diagnosis not present

## 2020-09-13 DIAGNOSIS — I10 Essential (primary) hypertension: Secondary | ICD-10-CM | POA: Diagnosis not present

## 2020-09-13 DIAGNOSIS — Z89511 Acquired absence of right leg below knee: Secondary | ICD-10-CM | POA: Diagnosis not present

## 2020-09-13 LAB — RENAL FUNCTION PANEL
Albumin: 2.1 g/dL — ABNORMAL LOW (ref 3.5–5.0)
Anion gap: 13 (ref 5–15)
BUN: 18 mg/dL (ref 8–23)
CO2: 24 mmol/L (ref 22–32)
Calcium: 8.4 mg/dL — ABNORMAL LOW (ref 8.9–10.3)
Chloride: 97 mmol/L — ABNORMAL LOW (ref 98–111)
Creatinine, Ser: 3.52 mg/dL — ABNORMAL HIGH (ref 0.61–1.24)
GFR, Estimated: 18 mL/min — ABNORMAL LOW (ref >60)
Glucose, Bld: 95 mg/dL (ref 70–99)
Phosphorus: 3.9 mg/dL (ref 2.5–4.6)
Potassium: 4.4 mmol/L (ref 3.5–5.1)
Sodium: 134 mmol/L — ABNORMAL LOW (ref 135–145)

## 2020-09-13 LAB — GLUCOSE, CAPILLARY
Glucose-Capillary: 87 mg/dL (ref 70–99)
Glucose-Capillary: 99 mg/dL (ref 70–99)
Glucose-Capillary: 121 mg/dL — ABNORMAL HIGH (ref 70–99)

## 2020-09-13 MED ORDER — HYDROMORPHONE HCL 2 MG PO TABS
ORAL_TABLET | ORAL | Status: AC
  Filled 2020-09-13: qty 1

## 2020-09-13 MED ORDER — ACETAMINOPHEN 325 MG PO TABS: 650.0000 mg | ORAL_TABLET | Freq: Four times a day (QID) | ORAL | 0 refills | Status: AC | PRN

## 2020-09-13 MED ORDER — COVID-19 MRNA VACC (MODERNA) 50 MCG/0.25ML IM SUSP
0.2500 mL | Freq: Once | INTRAMUSCULAR | Status: AC
  Administered 2020-09-13: 0.25 mL via INTRAMUSCULAR
  Filled 2020-09-13: qty 0.25

## 2020-09-13 MED ORDER — OXYCODONE HCL 5 MG PO TABS: 5.0000 mg | ORAL_TABLET | ORAL | 0 refills | Status: AC | PRN

## 2020-09-13 MED ORDER — OXYCODONE-ACETAMINOPHEN 5-325 MG PO TABS
ORAL_TABLET | ORAL | Status: AC
  Administered 2020-09-13: 2 via ORAL
  Filled 2020-09-13: qty 2

## 2020-09-13 NOTE — TOC Transition Note (Signed)
Transition of Care [facility]) - CM/SW Discharge Note   Patient Details  Name: Shey Bartmess MRN: [id number] Date of Birth: 12/07/1950  Transition [facility]) CM/SW Contact:  Gabrielle Dare Phone Number: 09/13/2020, 3:07 PM   Clinical Narrative:    Patient will Discharge TD:[facility] Anticipated DC Date:09/13/20 Family Notified:yes, daughter Remberto Lienhard Transport XB:[facility]   Per MD patient ready for DC to [facility]. RN, patient, patient's family, and facility notified of DC. Assessment, Fl2/Pasrr, and Discharge Summary sent to facility. RN given number for report [phone], Room # 225). DC packet on chart. Ambulance transport requested for patient for 4:00pm  CSW signing off.  Reed Breech [facility] [phone]     Final next level of care: Skilled Nursing Facility Barriers to Discharge: No Barriers Identified   Patient Goals and CMS Choice   CMS Medicare.gov Compare Post Acute Care list provided to:: Patient Represenative (must comment) (daughter [name]) Choice offered to / list presented to : Adult Children (daughter [name])  Discharge Placement              Patient [facility] bed at: [facility] Patient to be transferred to facility by: [place] Name of family member notified: Roena Malady, daughter Patient and family notified of of transfer: 09/13/20  Discharge Plan and Services In-house Referral: Clinical Social Work                                   Social Determinants of Health (SDOH) Interventions     Readmission Risk Interventions Readmission Risk Prevention Plan 01/19/2020 11/10/2019 08/10/2019  Transportation Screening Complete Complete Complete  PCP or Specialist Appt within 3-5 Days - - Complete  HRI or Home Care Consult - - Complete  Social Work Consult for [place] Planning/Counseling - - Complete  Palliative Care Screening - - Complete  Medication Review Press photographer) Complete Complete Complete  PCP or  Specialist appointment within 3-5 days of discharge Complete - -  [place] or Home Care Consult Complete Complete -  SW Recovery Care/Counseling Consult Complete Complete -  Palliative Care Screening Complete Not Applicable -  [place] Complete Complete -  Some recent data might be hidden

## 2020-09-13 NOTE — Plan of Care (Signed)
  Problem: Activity: Goal: Risk for activity intolerance will decrease Outcome: Progressing   Problem: Coping: Goal: Level of anxiety will decrease Outcome: Progressing   Problem: Pain Managment: Goal: General experience of comfort will improve Outcome: Progressing   Problem: Safety: Goal: Ability to remain free from injury will improve Outcome: Progressing   Problem: Skin Integrity: Goal: Risk for impaired skin integrity will decrease Outcome: Progressing   

## 2020-09-13 NOTE — Progress Notes (Addendum)
Physical Therapy Treatment Patient Details Name: Dennis Macias MRN: [id number] DOB: [birth year] Today's Date: 09/13/2020    History of Present Illness Pt is a 70 y/o male with PMH of L BKA, s/p balloon angioplasty of occluded vessels R foot 11/21, L ICA CVA (2017), NIDDM type II with retinopathy, CKD on hemodialysis, nephrotic syndrome, combined systolic and diastolic CHF, CAD w/ NSTEMI, HTN, HLD, tubular adenmoa of colon, tachycardia, syncope, PVD, and protein malnutrition who presents after a syncopal episode at hemodialysis. His R foot wounds continue to worsen with noted dry gangrene on 1st and 2nd toes. now s/p angioplasty of the right posterior tibial artery on 1/27; MRI revealed acute ischemic nonhemorrhagic L thalamic infarct and multiple chronic micro hemorrhages scattered throughout both cerebral hemispheres; R TMA 08/11/20.  s/p R BKA    PT Comments    Pt supine in bed on arrival.  Performed R LE exercises and progression OOB to recliner via AP scoot.  Pt required mod+2/max+2 for mobility.  Pt continues to benefit from skilled rehab in a post acute setting to improve strength and function to decrease caregiver burden.     MD ordered B limb shrinkers post PT session.  Pt also educated on keep R knee straight and avoid pillow placement under the R knee during healing.    Follow Up Recommendations  SNF;Supervision/Assistance - 24 hour     Equipment Recommendations  None recommended by PT    Recommendations for Other Services       Precautions / Restrictions Precautions Precautions: Fall Precaution Comments: hx of L BKA; HOH.  New R BKA Restrictions Weight Bearing Restrictions: Yes RLE Weight Bearing: Non weight bearing LLE Weight Bearing: Non weight bearing    Mobility  Bed Mobility Overal bed mobility: Needs Assistance Bed Mobility: Supine to Sit       Sit to supine: Mod assist;+2 for safety/equipment;+2 for physical assistance   General bed mobility comments: Pt moving  into long sitting with mod +2. Poor ability to maintain balance in sitting.  Assistance to spin on bottom to direct feet to the side.  Pt set up for transition to AP transfer into recliner chair.  Use of bed pad for scooting.    Transfers Overall transfer level: Needs assistance Equipment used: None Transfers: Comptroller transfers: Mod assist;+2 physical assistance   General transfer comment: Mod +2 with cues for hand placement and forward weight shifting to maintain balance and scoot into recliner.  Pt fatigues quickly with activity.  Ambulation/Gait                 Stairs             Wheelchair Mobility    Modified Rankin (Stroke Patients Only)       Balance                                            Cognition Arousal/Alertness: Awake/alert Behavior During Therapy: Flat affect Overall Cognitive Status: Impaired/Different from baseline Area of Impairment: Memory;Following commands;Problem solving                     Memory: Decreased short-term memory Following Commands: Follows one step commands with increased time     Problem Solving: Slow processing;Difficulty sequencing;Requires verbal cues General Comments: Pt HOH.      Exercises Amputee Exercises  Quad Sets: AROM;10 reps;Supine;Right Hip Extension: AROM;Right;10 reps;Sidelying Hip ABduction/ADduction: AROM;Right;10 reps;Supine Knee Flexion: AROM;Right;10 reps;Supine    General Comments        Pertinent Vitals/Pain Pain Assessment: Faces Faces Pain Scale: Hurts little more Pain Location: R residual limb Pain Descriptors / Indicators: Tender;Grimacing Pain Intervention(s): Monitored during session;Repositioned    Home Living                      Prior Function            PT Goals (current goals can now be found in the care plan section) Acute Rehab PT Goals Patient Stated Goal: get back to  rehab Potential to Achieve Goals: Fair Progress towards PT goals: Progressing toward goals    Frequency    Min 2X/week      PT Plan Current plan remains appropriate    Co-evaluation              AM-PAC PT "6 Clicks" Mobility   Outcome Measure  Help needed turning from your back to your side while in a flat bed without using bedrails?: A Lot Help needed moving from lying on your back to sitting on the side of a flat bed without using bedrails?: A Lot Help needed moving to and from a bed to a chair (including a wheelchair)?: A Lot Help needed standing up from a chair using your arms (e.g., wheelchair or bedside chair)?: Total Help needed to walk in hospital room?: Total Help needed climbing 3-5 steps with a railing? : Total 6 Click Score: 9    End of Session Equipment Utilized During Treatment: Gait belt Activity Tolerance: Patient tolerated treatment well Patient left: in chair;with call bell/phone within reach;with chair alarm set Nurse Communication: Mobility status (informed Sherryll Burger to utilize bed pad for back to bed lateral scoot or AP scoot.) PT Visit Diagnosis: Unsteadiness on feet (R26.81);Muscle weakness (generalized) (M62.81);Difficulty in walking, not elsewhere classified (R26.2);Other symptoms and signs involving the nervous system (R29.898)     Time: 2549-8264 PT Time Calculation (min) (ACUTE ONLY): 13 min  Charges:  $Therapeutic Activity: 8-22 mins                     Erasmo Leventhal , PTA Acute Rehabilitation Services Pager [phone] Office [phone]     Dennis Macias 09/13/2020, 1:47 PM

## 2020-09-13 NOTE — TOC Progression Note (Signed)
Transition of Care [facility]) - Progression Note    Patient Details  Name: Dennis Macias MRN: [id number] Date of Birth: 1950-11-14  Transition of Care [facility]) CM/SW [place], [place] Phone Number: 09/13/2020, 1:56 PM  Clinical Narrative:    CSW has received insurance auth for pt.  Will need booster before discharging to SNF. Everlene Balls #- [id number] Health Plan #-  Case Manager: Barb Merino Authorization dates: 09/13/20-09/16/20, review date 3/10 Fax Number: 1 [phone]      Expected Discharge Plan: [place] Barriers to Discharge: Continued Medical Work up  Expected Discharge Plan and Services Expected Discharge Plan: [place] In-house Referral: Clinical Social Work     Living arrangements for the past 2 months: [place]                                       Social Determinants of Health (SDOH) Interventions    Readmission Risk Interventions Readmission Risk Prevention Plan 01/19/2020 11/10/2019 08/10/2019  Transportation Screening Complete Complete Complete  PCP or Specialist Appt within 3-5 Days - - Complete  HRI or [place] - - Complete  Social Work Consult for [place] Planning/Counseling - - Complete  Palliative Care Screening - - Complete  Medication Review Press photographer) Complete Complete Complete  PCP or Specialist appointment within 3-5 days of discharge Complete - -  [place] or Home Care Consult Complete Complete -  SW Recovery Care/Counseling Consult Complete Complete -  Palliative Care Screening Complete Not Applicable -  [place] Complete Complete -  Some recent data might be hidden

## 2020-09-13 NOTE — Progress Notes (Signed)
[facility] Progress Note   Subjective: Seen at start of HD. No C/Os. Pleasantly confused. SNF placement pending.   Objective Vitals:   09/12/20 1527 09/12/20 2149 09/13/20 0500 09/13/20 0906  BP: 108/63 (!) 142/69 127/62 117/64  Pulse: 78 74 72 70  Resp: 18 18 15 17   Temp: 98.9 F (37.2 C) 98.8 F (37.1 C) 98.6 F (37 C) 98.8 F (37.1 C)  TempSrc: Oral Oral Oral Oral  SpO2: 96% 99%  100%  Weight:      Height:       Physical Exam General: Chronically ill appearing male in NAD Heart:S1,S2 RRR Lungs:CTAB Anteriorly. No WOB Abdomen: S, NT. Has condom cath scant amount dark tea colored urine Extremities: Bilateral BKA. R stump suture line intact with dried blood along suture lines. No stump edema Dialysis Access: R AVF +T/B  Additional Objective Labs: Basic Metabolic Panel: Recent Labs  Lab 09/11/20 0130 09/12/20 0728 09/13/20 0216  NA 136 134* 134*  K 3.5 4.1 4.4  CL 94* 93* 97*  CO2 26 24 24   GLUCOSE 121* 115* 95  BUN 28* 44* 18  CREATININE 4.89* 6.55* 3.52*  CALCIUM 8.8* 8.6* 8.4*  PHOS 3.9 5.6* 3.9   Liver Function Tests: Recent Labs  Lab 09/08/20 0633 09/10/20 0224 09/11/20 0130 09/12/20 0728 09/13/20 0216  AST 14*  --   --   --   --   ALT 6  --   --   --   --   ALKPHOS 72  --   --   --   --   BILITOT 0.6  --   --   --   --   PROT 6.9  --   --   --   --   ALBUMIN 2.5*   < > 2.2* 2.1* 2.1*   < > = values in this interval not displayed.   No results for input(s): LIPASE, AMYLASE in the last 168 hours. CBC: Recent Labs  Lab 09/08/20 0633 09/09/20 0708 09/09/20 1544 09/10/20 0224 09/11/20 0130 09/11/20 1825 09/12/20 0729  WBC 9.7 10.5  --  10.9* 10.9*  --  9.9  NEUTROABS  --   --   --   --   --   --  7.4  HGB 8.9* 6.9*   < > 8.1* 7.9* 7.7* 7.8*  HCT 28.9* 22.7*   < > 26.3* 23.8* 23.7* 24.3*  MCV 95.1 97.0  --  94.6 92.2  --  93.8  PLT 384 344  --  363 354  --  387   < > = values in this interval not displayed.   Blood  Culture    Component Value Date/Time   SDES BLOOD LEFT HAND 08/04/2020 1300   SDES BLOOD LEFT HAND 08/04/2020 1300   SPECREQUEST  08/04/2020 1300    BOTTLES DRAWN AEROBIC AND ANAEROBIC Blood Culture adequate volume   SPECREQUEST  08/04/2020 1300    BOTTLES DRAWN AEROBIC AND ANAEROBIC Blood Culture adequate volume   CULT  08/04/2020 1300    NO GROWTH 5 DAYS Performed at [facility], [place] [address], [place], [place] [address]    CULT  08/04/2020 1300    NO GROWTH 5 DAYS Performed at [facility], [place] [address] [place], [place] [address]    REPTSTATUS 08/09/2020 FINAL 08/04/2020 1300   REPTSTATUS 08/09/2020 FINAL 08/04/2020 1300    Cardiac Enzymes: No results for input(s): CKTOTAL, CKMB, CKMBINDEX, TROPONINI in the last 168 hours. CBG:  Recent Labs  Lab 09/12/20 1157 09/12/20 1619 09/12/20 2150 09/13/20 0648 09/13/20 1121  GLUCAP 110* 144* 90 87 99   Iron Studies: No results for input(s): IRON, TIBC, TRANSFERRIN, FERRITIN in the last 72 hours. @lablastinr3 @ Studies/Results: No results found. Medications: . sodium chloride 10 mL/hr at 09/08/20 0902  . iron sucrose 100 mg (09/12/20 1200)  . magnesium sulfate bolus IVPB     . sodium chloride   Intravenous Once  . aspirin EC  81 mg Oral Daily  . atorvastatin  80 mg Oral QHS  . Chlorhexidine Gluconate Cloth  6 each Topical Once   And  . Chlorhexidine Gluconate Cloth  6 each Topical Once  . clopidogrel  75 mg Oral Daily  . COVID-19 mRNA vaccine (Moderna)  0.25 mL Intramuscular Once  . docusate sodium  100 mg Oral Daily  . feeding supplement (NEPRO CARB STEADY)  237 mL Oral BID BM  . finasteride  5 mg Oral Daily  . heparin  5,000 Units Subcutaneous Q8H  . insulin aspart  0-6 Units Subcutaneous TID WC  . pantoprazole  40 mg Oral Daily  . sertraline  25 mg Oral Daily  . sevelamer carbonate  800 mg Oral TID WC     Dialysis Orders: [facility] T,Th, S 4hrs, BFR400/500 EDW 61kg,2K/2Ca  R AVF -No heparin  -Mircera168mcg IV q2wks - last3/1/22 -Venofer 100mg  IV X 10 HD treatments- last 09/06/20  -Renvela 800mg  1 tablet PO TID with meals  Last Labs: Hgb8.9, TSAT17 (08/25/20 OP HD), K3.0, Ca9.0, P6.1 (08/25/20 OP HD), PTH 262 (08/25/20 OP HD), Alb2.5  Assessment/Plan: 1. PVD RLE s/p R non-healing TMA/now sp R BKA 09/08/20- Plan/mange per [facility],noted for CIR versus SNF, patient had thoughts of going home-but noted recommendation by OT PT.  2. ESRD- on HD TTS HD off schedule 09/12/20. Short Tx today to resume T,Th,S schedule.  3. Hypertension/volume- BPstable. appears euvolemic, UFas tolerated. 4. Anemiaof CKD- Hgbdown to 6.9 afterBKA 09/09/20. S/P 1 unit of PRBCs. HGB 7.8 09/12/20.  5. Secondary Hyperparathyroidism -Ca/phos stablecontinue binder- renvela. Last PTH at goal (OP HD), No VDRA 6.  Nutrition-PCM. Renal diet-add prosource.   Brax Walen H. Kodee Ravert NP-C 09/13/2020, 12:01 PM  [employer] [phone]

## 2020-09-13 NOTE — Care Management Important Message (Signed)
Important Message  Patient Details  Name: Dennis Macias MRN: [id number] Date of Birth: Aug 12, 1950   Medicare Important Message Given:  Yes     Cherlynn Popiel P Abriel Hattery 09/13/2020, 2:52 PM

## 2020-09-13 NOTE — Consult Note (Signed)
   [facility] Inpatient Consult   09/13/2020  Wilmore Holsomback [birth year] [id number]   [facility] [[facility]] Patient: Dennis Macias  PCP: [facility] Internal Medicine Embedded   Patient reviewed for disposition. Currently, to transition to [facility] according to inpatient [facility] team.  Plan: Will alert Embedded RNCM of disposition.   Natividad Brood, RN BSN [facility] Liaison  [phone] business mobile phone Toll free office [phone]  Fax number: [phone] [email or web address]

## 2020-09-13 NOTE — Plan of Care (Signed)

## 2020-09-13 NOTE — TOC Progression Note (Signed)
Transition of Care [facility]) - Progression Note    Patient Details  Name: Dennis Macias MRN: [id number] Date of Birth: [birth year]  Transition of Care [facility]) CM/SW [place], [place] Phone Number: 09/13/2020, 11:00 AM  Clinical Narrative:    CSW spoke with facility.  Pt's insurance Josem Kaufmann is still pending and pt will need covid booster for gym before returning to SNF.  TOC will continue to assist with disposition planning.   Expected Discharge Plan: Skilled Nursing Facility Barriers to Discharge: Continued Medical Work up  Expected Discharge Plan and Services Expected Discharge Plan: [place] In-house Referral: Clinical Social Work     Living arrangements for the past 2 months: [place]                                       Social Determinants of Health (SDOH) Interventions    Readmission Risk Interventions Readmission Risk Prevention Plan 01/19/2020 11/10/2019 08/10/2019  Transportation Screening Complete Complete Complete  PCP or Specialist Appt within 3-5 Days - - Complete  HRI or Home Care Consult - - Complete  Social Work Consult for [place] Planning/Counseling - - Complete  Palliative Care Screening - - Complete  Medication Review Press photographer) Complete Complete Complete  PCP or Specialist appointment within 3-5 days of discharge Complete - -  [place] or Home Care Consult Complete Complete -  SW Recovery Care/Counseling Consult Complete Complete -  Palliative Care Screening Complete Not Applicable -  [place] Complete Complete -  Some recent data might be hidden

## 2020-09-13 NOTE — Progress Notes (Signed)
Orthopedic Tech Progress Note Patient Details:  Dennis Macias Jan 05, 1951 [id number] Called in order to [facility] for BLE BKA SHRINKERS  Patient ID: Dennis Macias, male   DOB: 09-30-1950, 70 y.o.   MRN: [id number]   Dennis Macias 09/13/2020, 2:03 PM

## 2020-09-15 DIAGNOSIS — Z992 Dependence on renal dialysis: Secondary | ICD-10-CM | POA: Diagnosis not present

## 2020-09-15 DIAGNOSIS — N2581 Secondary hyperparathyroidism of renal origin: Secondary | ICD-10-CM | POA: Diagnosis not present

## 2020-09-15 DIAGNOSIS — N186 End stage renal disease: Secondary | ICD-10-CM | POA: Diagnosis not present

## 2020-09-15 NOTE — Progress Notes (Signed)
CCM encounter regarding patient's level of care and caregiving resources was reviewed.  Appropriate steps are being taken to address these concerns.  Reviewed with Dr. Alfonse Spruce as documented.

## 2020-09-17 DIAGNOSIS — N186 End stage renal disease: Secondary | ICD-10-CM | POA: Diagnosis not present

## 2020-09-17 DIAGNOSIS — N2581 Secondary hyperparathyroidism of renal origin: Secondary | ICD-10-CM | POA: Diagnosis not present

## 2020-09-17 DIAGNOSIS — Z992 Dependence on renal dialysis: Secondary | ICD-10-CM | POA: Diagnosis not present

## 2020-09-20 DIAGNOSIS — N186 End stage renal disease: Secondary | ICD-10-CM | POA: Diagnosis not present

## 2020-09-20 DIAGNOSIS — Z992 Dependence on renal dialysis: Secondary | ICD-10-CM | POA: Diagnosis not present

## 2020-09-20 DIAGNOSIS — N2581 Secondary hyperparathyroidism of renal origin: Secondary | ICD-10-CM | POA: Diagnosis not present

## 2020-09-22 DIAGNOSIS — N186 End stage renal disease: Secondary | ICD-10-CM | POA: Diagnosis not present

## 2020-09-22 DIAGNOSIS — Z992 Dependence on renal dialysis: Secondary | ICD-10-CM | POA: Diagnosis not present

## 2020-09-22 DIAGNOSIS — N2581 Secondary hyperparathyroidism of renal origin: Secondary | ICD-10-CM | POA: Diagnosis not present

## 2020-09-26 DIAGNOSIS — M6281 Muscle weakness (generalized): Secondary | ICD-10-CM | POA: Diagnosis not present

## 2020-09-26 DIAGNOSIS — Z89519 Acquired absence of unspecified leg below knee: Secondary | ICD-10-CM | POA: Diagnosis not present

## 2020-09-27 ENCOUNTER — Encounter: Payer: Medicare PPO | Admitting: Vascular Surgery

## 2020-09-28 IMAGING — CT CT HEAD W/O CM
4 series · 16 of 47 positions shown, 18 images · non-contrast
Comparison: 07/31/2019

CLINICAL DATA: Neuro deficit. Stroke suspected.

EXAM:
CT HEAD WITHOUT CONTRAST
TECHNIQUE: Contiguous axial images were obtained from the base of the skull
through the vertex without intravenous contrast.

[Series 3: head without · axial · non-contrast · 0.40mm/px · z∈[-116,+4]mm · 7 of 34 slices shown, 9 images]
[im 5/34  brain]
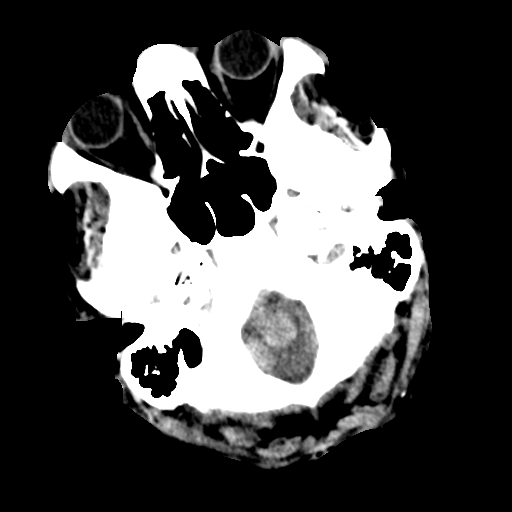
[im 5/34  bone]
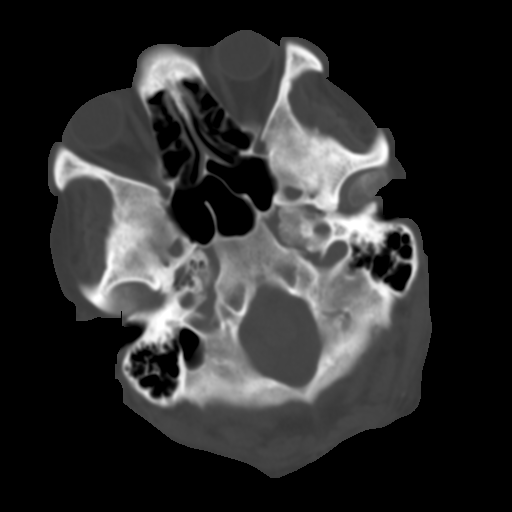
[im 9/34  brain]
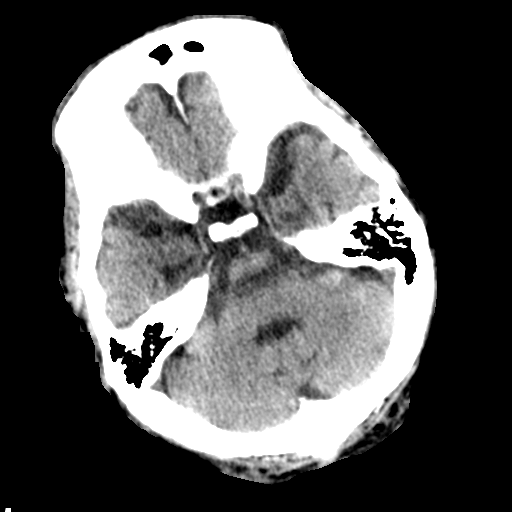
[im 13/34  brain]
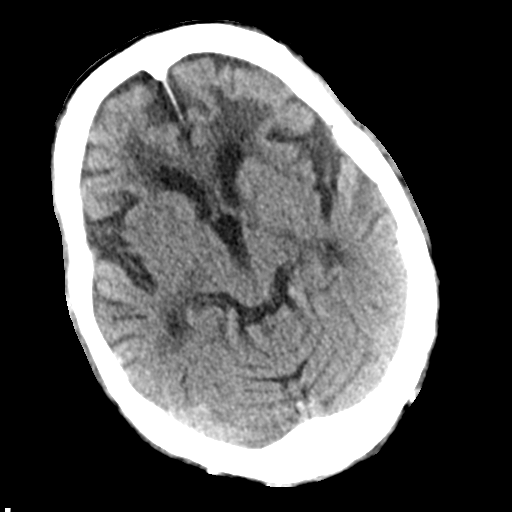
[im 17/34  brain]
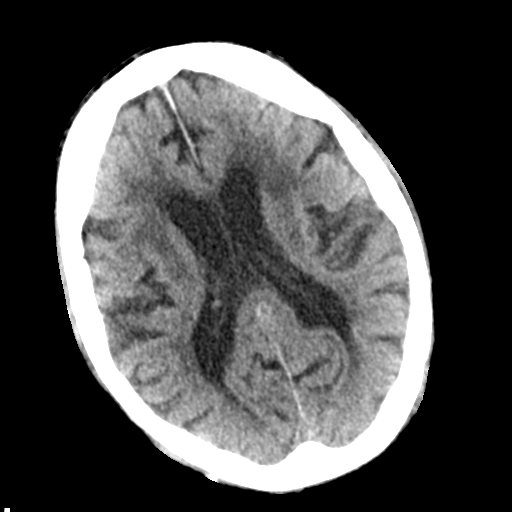
[im 21/34  brain]
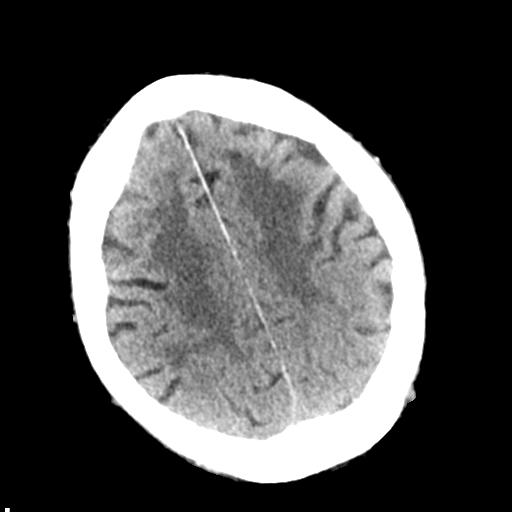
[im 21/34  bone]
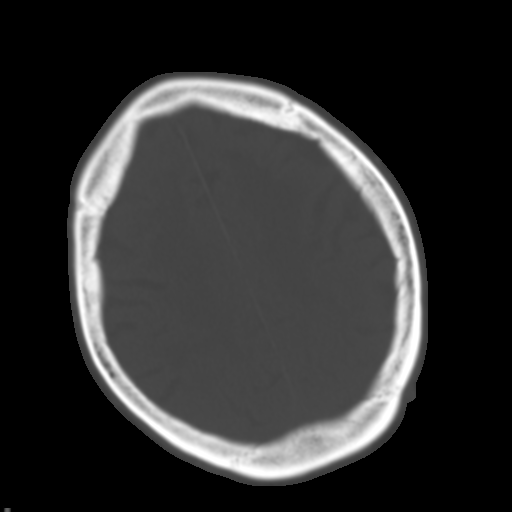
[im 25/34  brain]
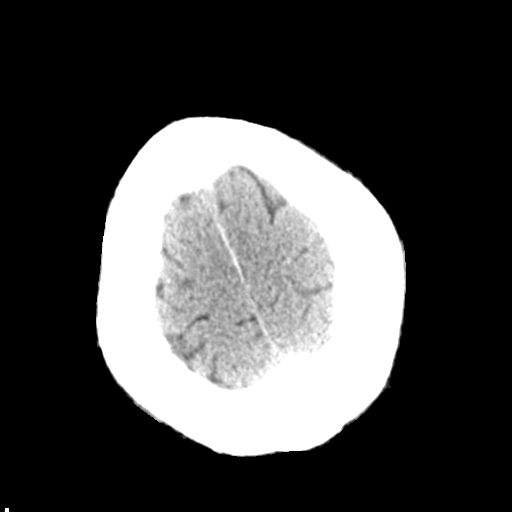
[im 29/34  brain]
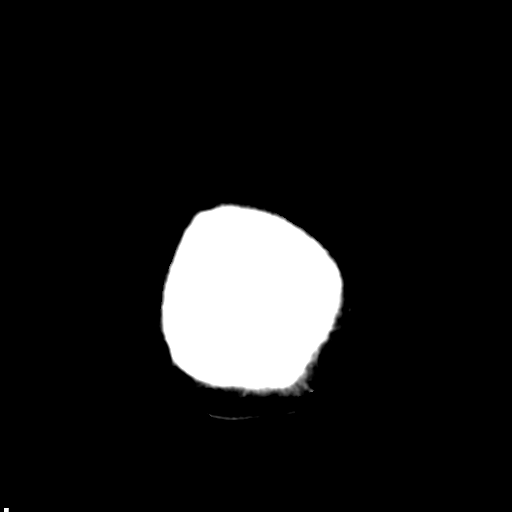

[Series 4: head bone · axial · 0.40mm/px · z∈[-120,-88]mm · 3 of 83 slices shown]
[im 9/83  bone]
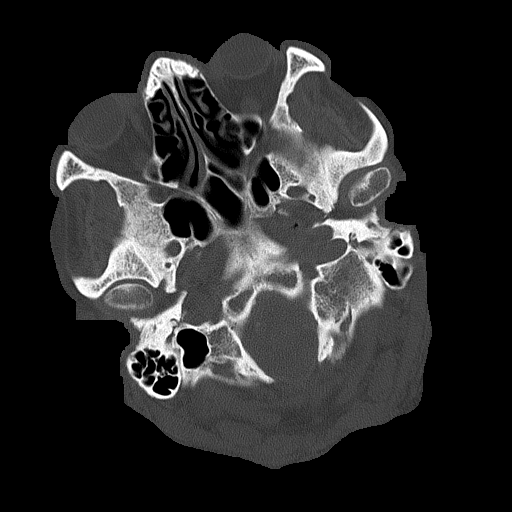
[im 17/83  bone]
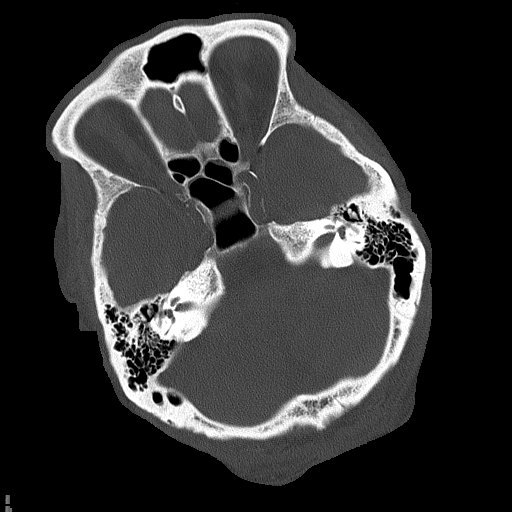
[im 25/83  bone]
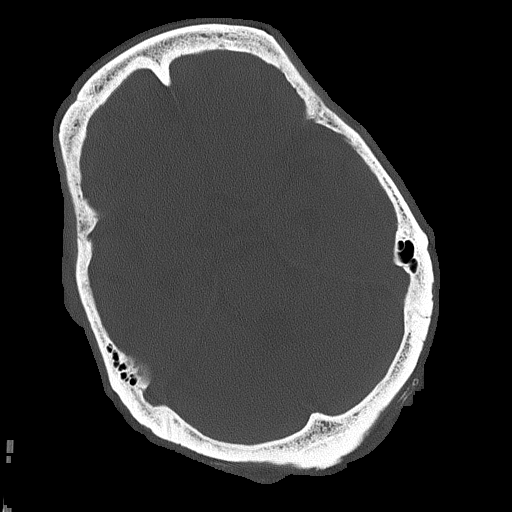

[Series 5: head without cor · coronal · non-contrast · 0.33mm/px · 3 of 70 slices shown]
[im 24/70  brain]
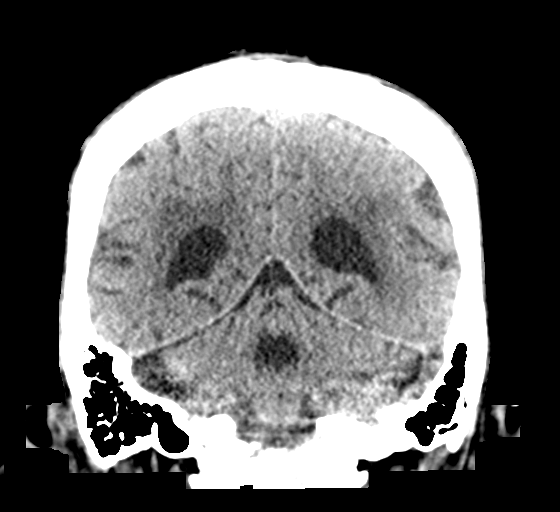
[im 31/70  brain]
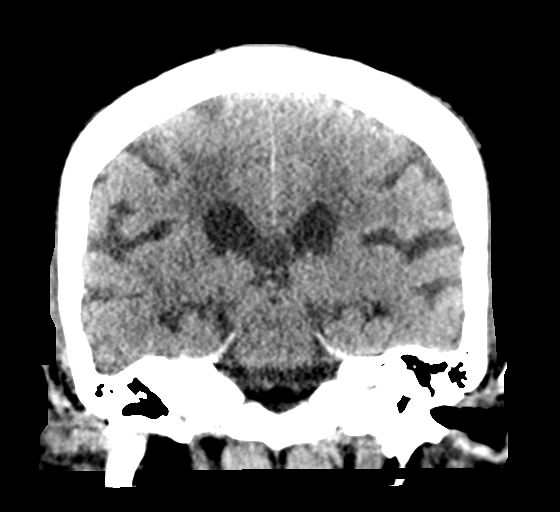
[im 39/70  brain]
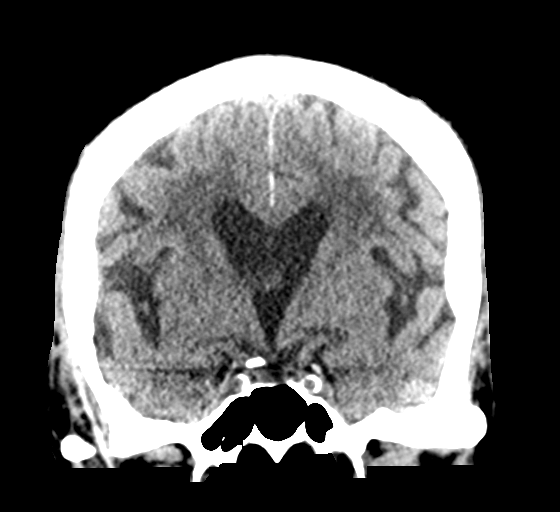

[Series 6: head without sag · sagittal · non-contrast · 0.33mm/px · 3 of 67 slices shown]
[im 23/67  brain]
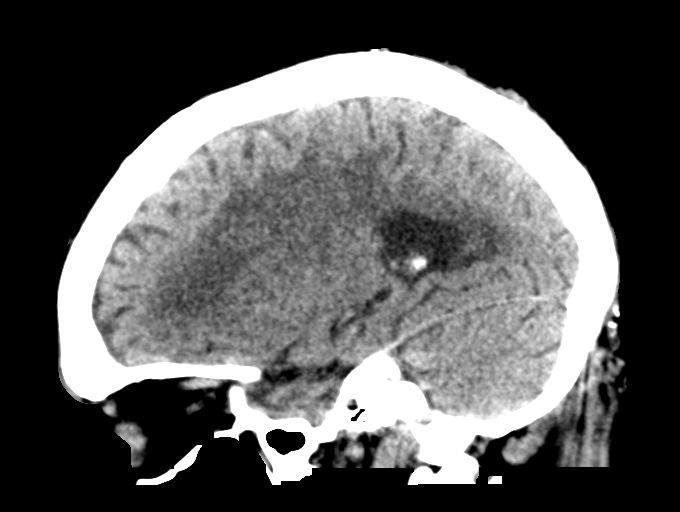
[im 34/67  brain]
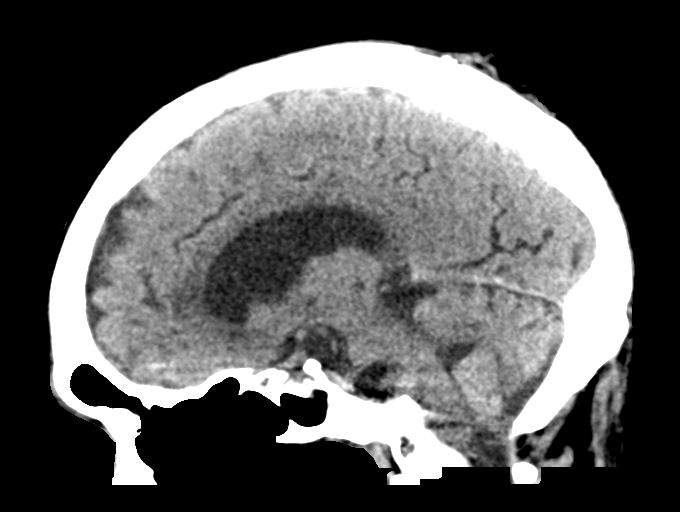
[im 45/67  brain]
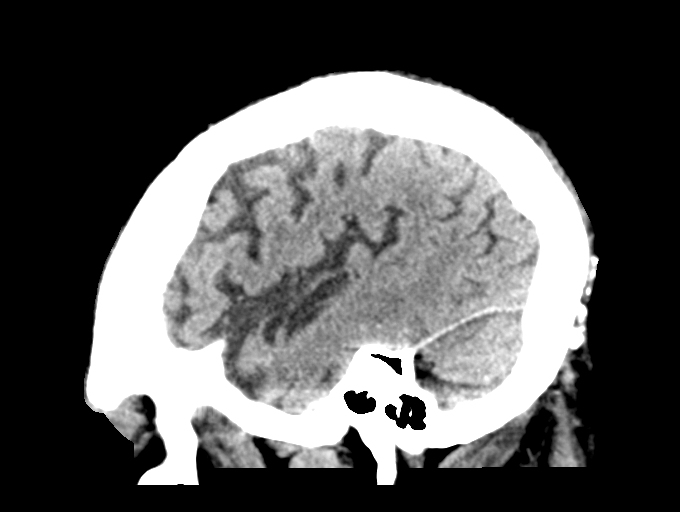

[16 of 47 positions shown; findings below may reference images not displayed]

FINDINGS: Brain: No evidence of acute infarction, hemorrhage, hydrocephalus,
extra-axial collection or mass lesion/mass effect. Atrophy and
advanced chronic microvascular ischemic changes are again noted.

Vascular: No hyperdense vessel or unexpected calcification.

Skull: Normal. Negative for fracture or focal lesion.

Sinuses/Orbits: No acute finding.

Other: None.
IMPRESSION: 1. No acute intracranial abnormality.
2. Atrophy and advanced chronic microvascular ischemic changes.

## 2020-09-29 DIAGNOSIS — N2581 Secondary hyperparathyroidism of renal origin: Secondary | ICD-10-CM | POA: Diagnosis not present

## 2020-09-29 DIAGNOSIS — N186 End stage renal disease: Secondary | ICD-10-CM | POA: Diagnosis not present

## 2020-09-29 DIAGNOSIS — Z992 Dependence on renal dialysis: Secondary | ICD-10-CM | POA: Diagnosis not present

## 2020-09-30 DIAGNOSIS — N186 End stage renal disease: Secondary | ICD-10-CM | POA: Diagnosis not present

## 2020-09-30 DIAGNOSIS — E118 Type 2 diabetes mellitus with unspecified complications: Secondary | ICD-10-CM | POA: Diagnosis not present

## 2020-09-30 DIAGNOSIS — I739 Peripheral vascular disease, unspecified: Secondary | ICD-10-CM | POA: Diagnosis not present

## 2020-09-30 DIAGNOSIS — L89153 Pressure ulcer of sacral region, stage 3: Secondary | ICD-10-CM | POA: Diagnosis not present

## 2020-10-01 DIAGNOSIS — Z992 Dependence on renal dialysis: Secondary | ICD-10-CM | POA: Diagnosis not present

## 2020-10-01 DIAGNOSIS — N186 End stage renal disease: Secondary | ICD-10-CM | POA: Diagnosis not present

## 2020-10-01 DIAGNOSIS — N2581 Secondary hyperparathyroidism of renal origin: Secondary | ICD-10-CM | POA: Diagnosis not present

## 2020-10-03 ENCOUNTER — Encounter: Payer: Self-pay | Admitting: Vascular Surgery

## 2020-10-04 DIAGNOSIS — N186 End stage renal disease: Secondary | ICD-10-CM | POA: Diagnosis not present

## 2020-10-04 DIAGNOSIS — N2581 Secondary hyperparathyroidism of renal origin: Secondary | ICD-10-CM | POA: Diagnosis not present

## 2020-10-04 DIAGNOSIS — Z992 Dependence on renal dialysis: Secondary | ICD-10-CM | POA: Diagnosis not present

## 2020-10-05 DIAGNOSIS — E119 Type 2 diabetes mellitus without complications: Secondary | ICD-10-CM | POA: Diagnosis not present

## 2020-10-05 DIAGNOSIS — Z961 Presence of intraocular lens: Secondary | ICD-10-CM | POA: Diagnosis not present

## 2020-10-05 DIAGNOSIS — H401134 Primary open-angle glaucoma, bilateral, indeterminate stage: Secondary | ICD-10-CM | POA: Diagnosis not present

## 2020-10-05 DIAGNOSIS — H524 Presbyopia: Secondary | ICD-10-CM | POA: Diagnosis not present

## 2020-10-06 DIAGNOSIS — Z992 Dependence on renal dialysis: Secondary | ICD-10-CM | POA: Diagnosis not present

## 2020-10-06 DIAGNOSIS — E1122 Type 2 diabetes mellitus with diabetic chronic kidney disease: Secondary | ICD-10-CM | POA: Diagnosis not present

## 2020-10-06 DIAGNOSIS — N186 End stage renal disease: Secondary | ICD-10-CM | POA: Diagnosis not present

## 2020-10-06 DIAGNOSIS — N2581 Secondary hyperparathyroidism of renal origin: Secondary | ICD-10-CM | POA: Diagnosis not present

## 2020-10-07 DIAGNOSIS — Z992 Dependence on renal dialysis: Secondary | ICD-10-CM | POA: Diagnosis not present

## 2020-10-07 DIAGNOSIS — N186 End stage renal disease: Secondary | ICD-10-CM | POA: Diagnosis not present

## 2020-10-07 DIAGNOSIS — E1122 Type 2 diabetes mellitus with diabetic chronic kidney disease: Secondary | ICD-10-CM | POA: Diagnosis not present

## 2020-10-08 ENCOUNTER — Other Ambulatory Visit (HOSPITAL_COMMUNITY): Payer: Self-pay

## 2020-10-08 DIAGNOSIS — Z992 Dependence on renal dialysis: Secondary | ICD-10-CM | POA: Diagnosis not present

## 2020-10-08 DIAGNOSIS — N2581 Secondary hyperparathyroidism of renal origin: Secondary | ICD-10-CM | POA: Diagnosis not present

## 2020-10-08 DIAGNOSIS — N186 End stage renal disease: Secondary | ICD-10-CM | POA: Diagnosis not present

## 2020-10-10 ENCOUNTER — Ambulatory Visit (INDEPENDENT_AMBULATORY_CARE_PROVIDER_SITE_OTHER): Payer: Medicare PPO | Admitting: Adult Health

## 2020-10-10 ENCOUNTER — Encounter: Payer: Self-pay | Admitting: Adult Health

## 2020-10-10 VITALS — BP 139/80 | HR 76

## 2020-10-10 DIAGNOSIS — I639 Cerebral infarction, unspecified: Secondary | ICD-10-CM

## 2020-10-10 NOTE — Progress Notes (Signed)
[facility] [address] [place]. [place] [address] [phone]       OFFICE  FOLLOW UP VISIT NOTE  Mr. Collyn Ribas Date of Birth:  Nov 05, 1950 Medical Record Number:  [id number]   Referring MD: Duffy Rhody Reason for Referral: stroke follow-up  Chief Complaint  Patient presents with  . Follow-up    RM 14 alone Pt is well, state he is doing alright he guess. No stroke symptoms       HPI:   Today, 10/10/2020, Mr. Sheils returns for stroke follow up unaccompanied after prior visit approximately 7 months ago  Stable from stroke standpoint without new or reoccurring stroke/TIA symptoms Compliant on DAPT and atorvastatin per review of facility [facility] Blood pressure today 139/80  Continued HD thrice weekly  Multiple hospitalizations since prior visit S/p L BKA 03/31/20 by Dr. Trula Slade S/p R [place] 09/08/2020 by Dr. Trula Slade Recurrent syncopal events likely 2/2 post HD hypotension  He thought todays visit was to remove stables from recent R BKA - f/u visit initially scheduled for today but was canceled and currently rescheduled on 4/6 He has no neurological concerns today      History provided for reference purposes only  Update 03/21/2020 JM: Mr. Wempe returns today, 03/21/2020, for stroke follow-up.  Previously seen by Dr. Leonie Man on 12/21/2019 and since that time has had an additional admission on 02/07/2020 for white matter bilateral strokes likely secondary to hypotension, start of HD for ESRD and worsening PAD now requiring amputation.    Presented to ED on 02/17/2020 after syncopal episode while using the bathroom.  MRI revealed acute white matter bilateral strokes in a watershed distribution.  Given watershed distribution and syncopal episode, strokes are likely due to hypotension post HD or orthostatic hypotension.  Antihypertensive regimen adjusted by nephrology due to recent start of HD.  MRA head negative for LVO.  Carotid ultrasound showed bilateral ICA 1 to 39%  stenosis.  2D echo showed an EF of 60 to 65%.  LDL 61.  A1c 6.1.  Recommended continuation of DAPT and atorvastatin and discharged home with recommendation of home health therapy.  No residual stroke deficits and currently at baseline.  He has not had any reoccurrence of hypotension since adjustment of antihypertensives.  Blood pressure routinely monitor at HD.  Blood pressure today 141/71.  He remains on aspirin and Plavix without bleeding or bruising.  Remains on atorvastatin 80 mg daily without myalgias.  In 01/2020, underwent right brachiocephalic fistula by Dr. Donzetta Matters.  Currently receiving HD trice weekly and follows closely with nephrology On 02/09/2020, underwent abdominal aortogram with bilateral runoff and atherectomy and angioplasty of left posterior tibial artery due to left great toe ulceration found to have severe tibial disease with occlusion of all tibial vessels.  Had follow-up with Dr. Trula Slade vascular surgery today with noted continued deterioration of left foot and plans on proceeding with below-knee amputation currently scheduled on 9/22  No further concerns at this time  Update 12/21/2019 Dr. Leonie Man : Patient is seen for follow-up after last consultation visit 2 months ago.  He was hospitalized on 11/06/2019 when he was eating breakfast and his girlfriend called paramedics he had a episode of feeling dizzy lightheaded and nauseous and nearly passing out.  He had extensive work-up in the hospital for this including arrhythmia, coronary artery disease, seizure, syncope and recent medication increase of hydralazine.  The patient underwent cardiac cath on 11/19/2019 which showed mild LAD lesion and ostial LAD lesion but he did not have  any chest pain hence intervention was not performed.  Patient became confused on 11/11/2019 and CT scan of the head showed no acute abnormalities.  Subsequently a code stroke was called and MRI scan was obtained which showed a tiny cortical infarct in left parietal  region which was felt to be incidental and could not explain his altered mental status which was thought to be likely due to encephalopathy possibly from cefepime antibiotic he got for Pseudomonas UTI.  EEG showed moderate diffuse slowing consistent with encephalopathy.  His mental status gradually improved.  He was in mild renal failure which also improved.  He was subsequently transferred to inpatient rehab and has made progressive improvement.  Currently is living at home.  He is able to walk and is getting home physical and occupational therapy.  His mentation has improved significantly and is back to his baseline.  He does use a cane mostly when walking but uses walker for long distances.  Outpatient 30-day heart monitor was recommended but so far has not been done.  2D echo showed EF of 40 to 45% LA was severely dilated.  LDL cholesterol 56 mg percent.  Hemoglobin A1c was 5.9.  Patient was recommended to be on Plavix alone due to history of recent hematuria but review of medication shows that he is on both.  He is complaining of some bruising but no more hematuria or bleeding.  Initial Consult 10/14/2019 Dr. Leonie Man: Mr. Tajah Schreiner is  70 year old African-American gentleman who is seen today for office consultation visit for leg weakness.  He is accompanied by his son-in-law as well as his daughter is present for this visit over the phone.  History is obtained from them, review of referral notes and I personally reviewed imaging films in PACS. Patient has been having increasing gait difficulties for the last 6 months.  He has remote history of bilateral lacunar infarcts in 2017 at that time he had seen me.  Had mild residual left-sided weakness and gait difficulties but was able to walk independently.  For the last 6 months he has had progressive difficulty walking and feels that his legs are weak and give out.  He occasionally has numbness in his feet but this is not bothersome.  He feels his right leg seems  to be weaker now than the left one.  He denies significant back pain, radicular pain.  He has had problems with his prostate in the last several months and had urinary retention and chronic indwelling catheter.  He in fact is planning on having prostate surgery in the next few weeks.  Last month he was also diagnosed with bilateral pleural effusion and ascites which is required thoracocentesis but fluid analysis showed it to be a transudate and likely related to his hypoalbuminemia and anemia and renal failure.  Review of lab work show that on 10/13/2019 hemoglobin A1c was 5.2.  Hematocrit was 29.2 and albumin was low at 2.2.  Patient did have CT scan of the head on 08/05/2019 when he had a fall and sustained a left frontal contusion.  There is no acute brain abnormalities noted.  CT scan of cervical spine at that time showed spondylitic changes at C3/4.  On inquiry the patient and daughter state that he did have an episode in through the end of January this year when he had some speech difficulties with garbled speech and some confusion he was seen in the emergency room with symptoms resolved and apparently brain scan was unremarkable and he was  discharged home.  Patient was on aspirin until he started having some bleeding from his bladder and it was discontinued.  He has been tolerating Lipitor well without muscle aches and pains but is unclear as to when his last lipid profile was checked.  He denies any decreased appetite, weight loss or low-grade fevers. Prior stroke 09/12/2015-MRI scan showed 2 small acute lacunar infarcts in the right mid to posterior frontal lobe and a small acute infarct in the left anterior medial left frontal lobe. MRA of the brain showed no significant large vessel stenosis left terminal vertebral artery was not and irregular. LDL cholesterol is elevated at 148 and hemoglobin A1c at 10.7. Transesophageal echocardiogram showed normal ejection fraction of 60-65% and no cardiac source of  embolism or PFO. Carotid Doppler showed no significant extracranial stenosis. Patient was started on aspirin for stroke prevention and Lipitor.     ROS:   14 system review of systems is positive for those listed in HPI and all other systems negative  PMH:  Past Medical History:  Diagnosis Date  . Anemia   . Arthritis    past hx   . Blindness    right eye r/t diabetes per wife [name]  . Cardiorenal syndrome   . Cataract    removed both eyes  . CHF (congestive heart failure) (HCC)    hx  . CKD (chronic kidney disease)    Dialysis T Th Sat  . Dehydration   . Diabetes ([place])    type 2 - diet controlled, no meds  . Glaucoma   . History of CVA (cerebrovascular accident) 09/13/2015  . History of stroke 09/13/2015  . History of urinary retention   . HOH (hard of hearing)    no hearing aids  . Hyperlipidemia   . Hypertension   . NSTEMI (non-ST elevated myocardial infarction) ([place])   . Peripheral vascular disease ([place])   . Pernicious anemia 02/24/2018  . S/P TURP   . Stroke [facility])    2017- March, no deficit   . Syncope 11/2019  . Tachycardia 08/26/2017  . Tubular adenoma of colon 02/2017  . Walker as ambulation aid    and occasional uses cane  . Weight loss, non-intentional 08/26/2017   10 lbs between 6/18 & 2/19    Social History:  Social History   Socioeconomic History  . Marital status: Married    Spouse name: Not on file  . Number of children: Not on file  . Years of education: Not on file  . Highest education level: Not on file  Occupational History  . Not on file  Tobacco Use  . Smoking status: Former Smoker    Types: Cigarettes  . Smokeless tobacco: Former Systems developer    Types: Chew    Quit date: 07/09/1978  . Tobacco comment: quit yrs ago  Vaping Use  . Vaping Use: Never used  Substance and Sexual Activity  . Alcohol use: Not Currently    Alcohol/week: 1.0 standard drink    Types: 1 Cans of beer per week    Comment: quit last march/2017  . Drug use: No  . Sexual  activity: Not on file  Other Topics Concern  . Not on file  Social History Narrative  . Not on file   Social Determinants of Health   Financial Resource Strain: Not on file  Food Insecurity: Not on file  Transportation Needs: Not on file  Physical Activity: Not on file  Stress: Not on file  Social Connections: Not  on file  Intimate Partner Violence: Not on file    Medications:   Current Outpatient Medications on File Prior to Visit  Medication Sig Dispense Refill  . Accu-Chek FastClix Lancets MISC Check blood sugar up to 7 times a week as instructed 102 each 3  . acetaminophen (TYLENOL) 325 MG tablet Take 2 tablets (650 mg total) by mouth every 6 (six) hours as needed for mild pain or moderate pain (or Fever >/= 101). 60 tablet 0  . aspirin EC 81 MG tablet Take 1 tablet (81 mg total) by mouth daily.    Marland Kitchen atorvastatin (LIPITOR) 80 MG tablet Take 1 tablet (80 mg total) by mouth at bedtime. IM program 90 tablet 3  . atorvastatin (LIPITOR) 80 MG tablet TAKE 1 TABLET BY MOUTH ONCE DAILY AT BEDTIME 90 tablet 3  . Blood Glucose Monitoring Suppl (ACCU-CHEK GUIDE) w/Device KIT 1 each by Does not apply route daily. Check blood sugar as instructed up to 7 times a week 1 kit 0  . ciprofloxacin (CIPRO) 500 MG tablet TAKE 1 TABLET (500 MG TOTAL) BY MOUTH EVERY OTHER DAY FOR 7 DAYS. TAKE ON NON-DIALYSIS DAYS. 4 tablet 0  . clopidogrel (PLAVIX) 75 MG tablet TAKE 1 TABLET (75 MG TOTAL) BY MOUTH DAILY. 30 tablet 11  . Darbepoetin Alfa (ARANESP) 40 MCG/0.4ML SOSY injection Inject 0.4 mLs (40 mcg total) into the vein every Saturday with hemodialysis. 8.4 mL 0  . dorzolamide-timolol (COSOPT) 22.3-6.8 MG/ML ophthalmic solution INSTILL 1 DROP IN RIGHT EYE 2 TIMES A DAY FOR GLAUCOMA 10 mL 0  . finasteride (PROSCAR) 5 MG tablet TAKE 1 TABLET BY MOUTH ONCE DAILY FOR BPH 30 tablet 0  . glucose blood (ACCU-CHEK GUIDE) test strip Check blood sugar up to 7 times a week as instructed 100 each 3  .  lidocaine-prilocaine (EMLA) cream Apply 1 application topically as needed.    Marland Kitchen oxyCODONE-acetaminophen (PERCOCET/ROXICET) 5-325 MG tablet TAKE 1 TABLET BY MOUTH EVERY 8 HOURS AS NEEDED FOR PAIN DUE TO AMPUTATION 20 tablet 0  . pantoprazole (PROTONIX) 40 MG tablet Take 1 tablet (40 mg total) by mouth daily. 30 tablet 2  . polyethylene glycol (MIRALAX / GLYCOLAX) 17 g packet Take 17 g by mouth daily as needed for moderate constipation.     . sertraline (ZOLOFT) 25 MG tablet Take 25 mg by mouth daily.    . sevelamer carbonate (RENVELA) 800 MG tablet Take 800 mg by mouth See admin instructions. Take 800 mg by mouth three times a day with food on Sun/Mon/Wed/Fri and two times a day on Tues/Thurs/Sat    . sevelamer carbonate (RENVELA) 800 MG tablet TAKE 1 TAB BY MOUTH 2TIMES A DAY TUESDAY, THURSDAY & SATURDAY, 1TAB 3TIMES A DAY MONDAY, WEDNESDAY,FRIDAY & SUNDAY FOR BINDER RELATED TO RENAL DIALYSIS 90 tablet 0  . vitamin B-12 (CYANOCOBALAMIN) 500 MCG tablet Take 500 mcg by mouth daily.     No current facility-administered medications on file prior to visit.    Allergies:   Allergies  Allergen Reactions  . Cefepime Other (See Comments)    Pt had BAD encephalopathy from Cefepime     Today's Vitals   10/10/20 1529  BP: 139/80  Pulse: 76   There is no height or weight on file to calculate BMI.   Physical Exam General: Frail elderly African-American male, seated, in no evident distress Cardiovascular: regular rate and rhythm, no murmurs Musculoskeletal: b/l BKA  Neurologic Exam Mental Status: Awake and fully alert. Oriented to place and time.  Recent and remote memory intact. Attention span, concentration and fund of knowledge appropriate during visit. Mood and affect appropriate.  Speech slightly hypophonic but clear without dysarthria.  Cranial Nerves: Pupils equal, briskly reactive to light. Extraocular movements full without nystagmus. Visual fields full to confrontation. Hearing intact.  Facial sensation intact.  Face symmetric.  Tongue, palate moves normally and symmetrically.  Motor: Full strength BUE; full strength proximal BLE Sensory.: intact to touch , pinprick , position and vibratory sensation BUE Coordination finger-to-nose performed accurately bilaterally Gait and Station: Nonambulatory reflexes: 1+ and symmetric.     ASSESSMENT/PLAN: 70 year old African-American male with subacute gait difficulties and leg weakness of unclear etiology possibly interval new strokes and deconditioning due to his ongoing renal failure and medical issues.  Remote history of lacunar infarcts in 2017 with residual mild left hemiparesis and gait impairment.  Recent admission in May 2021 for altered mental status in the setting of Pseudomonas UTI with cefepime  encephalopathy and MRI showed a tiny incidental parietal embolic infarct unlikely to explain clinical presentation.  Recent admission 02/17/2020 after syncopal episode with acute white matter bilateral strokes secondary to hypotension (HD vs orthostatic).  Multiple vascular risk factors of HTN, HLD, DM, HD for ESRD and severe PAD s/p b/l BKA    History of multiple strokes -Recovered well from recent stroke without residual deficit -Continue aspirin and Plavix and atorvastatin 80 mg daily for secondary stroke prevention and PAD -Continue to follow with PCP for aggressive stroke risk factor management   Hx of HTN Recurrent syncopal events  -Syncopal events likely 2/2 post HD hypotension per ED notes -BP goal<130/90 with avoidance of hypotension.  Stable today -Managed and monitored by cardiology and nephrology  HLD -LDL goal<70.  Prior LDL 61.  On atorvastatin 80 mg daily - currently not fasting - needs repeat lipid panel at follow up visit with PCP  DM -A1c goal <7. Prior A1c 6.4. monitored by PCP  Severe PAD -s/p L BKA 03/2020 -s/p R BKA 09/08/2020 -followed by vascular surgery Dr. Trula Slade with f/u scheduled 4/6    No  further recommendations from neurology standpoint - follow-up on an as-needed basis   CC:  [place] provider: Dr. Marlou Starks, Rylee, MD    I spent 30 minutes of face-to-face and non-face-to-face time with patient.  This included previsit chart review, lab review, study review, order entry, electronic health record documentation, patient education/discussion regarding prior strokes, importance of managing stroke risk factors, s/p b/l BKA, and answered all other questions to patients satisfaction  Frann Rider, AGNP-BC  [facility] [address] [place], [place] [address]  Phone [phone] Fax [phone] Note: This document was prepared with digital dictation and possible smart phrase technology. Any transcriptional errors that result from this process are unintentional.

## 2020-10-10 NOTE — Patient Instructions (Addendum)
Your Plan:  Stable from stroke standpoint -no changes recommended today  Continue current treatment regimen and routine follow-up with vascular surgery - advised he has a f/u visit scheduled with [facility] on 4/6 as he was requesting to have staples removed  Request routine monitoring of cholesterol levels and A1c as well as facility managing blood pressure and cholesterol    No further recommendations or indication for routine monitoring at this time - follow up on an as needed basis       Thank you for coming to see [place] at [facility]. I hope we have been able to provide you high quality care today.  You may receive a patient satisfaction survey over the next few weeks. We would appreciate your feedback and comments so that we may continue to improve ourselves and the health of our patients.

## 2020-10-11 ENCOUNTER — Ambulatory Visit: Payer: Medicare PPO | Admitting: *Deleted

## 2020-10-11 DIAGNOSIS — I5042 Chronic combined systolic (congestive) and diastolic (congestive) heart failure: Secondary | ICD-10-CM

## 2020-10-11 DIAGNOSIS — I10 Essential (primary) hypertension: Secondary | ICD-10-CM

## 2020-10-11 DIAGNOSIS — E11319 Type 2 diabetes mellitus with unspecified diabetic retinopathy without macular edema: Secondary | ICD-10-CM

## 2020-10-11 DIAGNOSIS — Z992 Dependence on renal dialysis: Secondary | ICD-10-CM | POA: Diagnosis not present

## 2020-10-11 DIAGNOSIS — N186 End stage renal disease: Secondary | ICD-10-CM

## 2020-10-11 DIAGNOSIS — E785 Hyperlipidemia, unspecified: Secondary | ICD-10-CM

## 2020-10-11 DIAGNOSIS — N2581 Secondary hyperparathyroidism of renal origin: Secondary | ICD-10-CM | POA: Diagnosis not present

## 2020-10-11 DIAGNOSIS — I6389 Other cerebral infarction: Secondary | ICD-10-CM

## 2020-10-11 DIAGNOSIS — I2510 Atherosclerotic heart disease of native coronary artery without angina pectoris: Secondary | ICD-10-CM

## 2020-10-11 DIAGNOSIS — I739 Peripheral vascular disease, unspecified: Secondary | ICD-10-CM

## 2020-10-11 NOTE — Chronic Care Management (AMB) (Signed)
   10/11/2020  Dorita Fray 12-02-1950 [id number]  Spoke with [facility] at [phone] to verify that patient remains a resident there.   Plan:  Will continue to monitor patient's location monthly and close to CCM services if patient remains at SNF long term.  Kelli Churn RN, CCM, [facility] RN Care Manager [phone]

## 2020-10-11 NOTE — Progress Notes (Signed)
I agree with the above plan 

## 2020-10-12 ENCOUNTER — Ambulatory Visit (INDEPENDENT_AMBULATORY_CARE_PROVIDER_SITE_OTHER): Payer: Medicare PPO | Admitting: Physician Assistant

## 2020-10-12 ENCOUNTER — Other Ambulatory Visit: Payer: Self-pay

## 2020-10-12 VITALS — BP 106/66 | HR 68 | Temp 98.6°F | Resp 20

## 2020-10-12 DIAGNOSIS — Z89511 Acquired absence of right leg below knee: Secondary | ICD-10-CM

## 2020-10-12 DIAGNOSIS — I739 Peripheral vascular disease, unspecified: Secondary | ICD-10-CM

## 2020-10-12 NOTE — Progress Notes (Signed)
POST OPERATIVE OFFICE NOTE    CC:  F/u for surgery  HPI:  This is a 70 y.o. male who is s/p right below knee amputation by [name] on 09/08/20. This was done following a non healing right TMA. He says that he has intermittent sharp pains. He is not the best historian. He describes no pain in left BKA. He says he is doing therapy at the SNF. He says that they wrap and clean the BKA daily.   Allergies  Allergen Reactions  . Cefepime Other (See Comments)    Pt had BAD encephalopathy from Cefepime    Current Outpatient Medications  Medication Sig Dispense Refill  . Accu-Chek FastClix Lancets MISC Check blood sugar up to 7 times a week as instructed 102 each 3  . acetaminophen (TYLENOL) 325 MG tablet Take 2 tablets (650 mg total) by mouth every 6 (six) hours as needed for mild pain or moderate pain (or Fever >/= 101). 60 tablet 0  . aspirin EC 81 MG tablet Take 1 tablet (81 mg total) by mouth daily.    Marland Kitchen atorvastatin (LIPITOR) 80 MG tablet Take 1 tablet (80 mg total) by mouth at bedtime. IM program 90 tablet 3  . atorvastatin (LIPITOR) 80 MG tablet TAKE 1 TABLET BY MOUTH ONCE DAILY AT BEDTIME 90 tablet 3  . Blood Glucose Monitoring Suppl (ACCU-CHEK GUIDE) w/Device KIT 1 each by Does not apply route daily. Check blood sugar as instructed up to 7 times a week 1 kit 0  . ciprofloxacin (CIPRO) 500 MG tablet TAKE 1 TABLET (500 MG TOTAL) BY MOUTH EVERY OTHER DAY FOR 7 DAYS. TAKE ON NON-DIALYSIS DAYS. 4 tablet 0  . clopidogrel (PLAVIX) 75 MG tablet TAKE 1 TABLET (75 MG TOTAL) BY MOUTH DAILY. 30 tablet 11  . Darbepoetin Alfa (ARANESP) 40 MCG/0.4ML SOSY injection Inject 0.4 mLs (40 mcg total) into the vein every Saturday with hemodialysis. 8.4 mL 0  . dorzolamide-timolol (COSOPT) 22.3-6.8 MG/ML ophthalmic solution INSTILL 1 DROP IN RIGHT EYE 2 TIMES A DAY FOR GLAUCOMA 10 mL 0  . finasteride (PROSCAR) 5 MG tablet TAKE 1 TABLET BY MOUTH ONCE DAILY FOR BPH 30 tablet 0  . glucose blood (ACCU-CHEK GUIDE)  test strip Check blood sugar up to 7 times a week as instructed 100 each 3  . lidocaine-prilocaine (EMLA) cream Apply 1 application topically as needed.    Marland Kitchen oxyCODONE-acetaminophen (PERCOCET/ROXICET) 5-325 MG tablet TAKE 1 TABLET BY MOUTH EVERY 8 HOURS AS NEEDED FOR PAIN DUE TO AMPUTATION 20 tablet 0  . pantoprazole (PROTONIX) 40 MG tablet Take 1 tablet (40 mg total) by mouth daily. 30 tablet 2  . polyethylene glycol (MIRALAX / GLYCOLAX) 17 g packet Take 17 g by mouth daily as needed for moderate constipation.     . sertraline (ZOLOFT) 25 MG tablet Take 25 mg by mouth daily.    . sevelamer carbonate (RENVELA) 800 MG tablet Take 800 mg by mouth See admin instructions. Take 800 mg by mouth three times a day with food on Sun/Mon/Wed/Fri and two times a day on Tues/Thurs/Sat    . sevelamer carbonate (RENVELA) 800 MG tablet TAKE 1 TAB BY MOUTH 2TIMES A DAY TUESDAY, THURSDAY & SATURDAY, 1TAB 3TIMES A DAY MONDAY, WEDNESDAY,FRIDAY & SUNDAY FOR BINDER RELATED TO RENAL DIALYSIS 90 tablet 0  . vitamin B-12 (CYANOCOBALAMIN) 500 MCG tablet Take 500 mcg by mouth daily.     No current facility-administered medications for this visit.     ROS:  See HPI  Physical Exam:  Vitals:   10/12/20 1058  BP: 106/66  Pulse: 68  Resp: 20  Temp: 98.6 F (37 C)  TempSrc: Temporal  SpO2: 95%    Incision:   Right BKA staple well approximated. Erythematous along staple line. Hard to tell if this is just irritation from staples and sutures present. Removed 3 nylon sutures and staples today. There is a larger eschar on the lateral aspect of incision. Does not appear overtly infected Extremities: well perfused and warm Neuro: alert and oriented  Assessment/Plan:  This is a 70 y.o. male who is s/p right below knee amputation on 09/08/20 by [name]. Right BKA stump intact however there is some erythema and eschar present along incision. Hopefully this will continue to heal for him. Removed staples and sutures today.  Kerlix wrap applied - presently does not need a revision or more proximal amputation however it if shows worsening signs of adequate healing this may be his only option -I will bring him back in 2-3 weeks for repeat wound check    Karoline Caldwell, PA-C [facility] [phone]  Clinic MD: Laqueta Due

## 2020-10-15 DIAGNOSIS — Z992 Dependence on renal dialysis: Secondary | ICD-10-CM | POA: Diagnosis not present

## 2020-10-15 DIAGNOSIS — N186 End stage renal disease: Secondary | ICD-10-CM | POA: Diagnosis not present

## 2020-10-15 DIAGNOSIS — N2581 Secondary hyperparathyroidism of renal origin: Secondary | ICD-10-CM | POA: Diagnosis not present

## 2020-10-20 DIAGNOSIS — N186 End stage renal disease: Secondary | ICD-10-CM | POA: Diagnosis not present

## 2020-10-20 DIAGNOSIS — N2581 Secondary hyperparathyroidism of renal origin: Secondary | ICD-10-CM | POA: Diagnosis not present

## 2020-10-20 DIAGNOSIS — Z992 Dependence on renal dialysis: Secondary | ICD-10-CM | POA: Diagnosis not present

## 2020-10-26 ENCOUNTER — Telehealth: Payer: Self-pay

## 2020-10-26 ENCOUNTER — Ambulatory Visit (INDEPENDENT_AMBULATORY_CARE_PROVIDER_SITE_OTHER): Payer: Medicare PPO | Admitting: Physician Assistant

## 2020-10-26 ENCOUNTER — Encounter: Payer: Self-pay | Admitting: Physician Assistant

## 2020-10-26 ENCOUNTER — Other Ambulatory Visit: Payer: Self-pay

## 2020-10-26 VITALS — BP 162/77 | HR 64 | Temp 98.0°F | Resp 20

## 2020-10-26 DIAGNOSIS — Z89511 Acquired absence of right leg below knee: Secondary | ICD-10-CM | POA: Diagnosis not present

## 2020-10-26 DIAGNOSIS — N186 End stage renal disease: Secondary | ICD-10-CM | POA: Diagnosis not present

## 2020-10-26 DIAGNOSIS — Z992 Dependence on renal dialysis: Secondary | ICD-10-CM | POA: Diagnosis not present

## 2020-10-26 DIAGNOSIS — Z9119 Patient's noncompliance with other medical treatment and regimen: Secondary | ICD-10-CM | POA: Diagnosis not present

## 2020-10-26 DIAGNOSIS — I739 Peripheral vascular disease, unspecified: Secondary | ICD-10-CM

## 2020-10-26 DIAGNOSIS — Z89512 Acquired absence of left leg below knee: Secondary | ICD-10-CM | POA: Diagnosis not present

## 2020-10-26 NOTE — Telephone Encounter (Signed)
Patient was present in office today alone for RLE BKA wound check.When I undressed the patient's wound the writing on the dressing indicated that his dressing hadn't been changed since 10/22/20. His wound was visibly worst since his previous visit in our office on 10/12/2020. I then asked patient how often his dressing were changed, Dennis Macias then replied "When they think about changing it". While Corrie,PA was in the room with the patient discussing the potential procedure for the leg and the wound, he stated he hadn't received HD in 2 weeks and refused to continue. Corrie spoke with the patient in depth about the risks of NOT getting HD. Patient voiced he does not want to contiune being stuck and doesn't want HD and that he understands the risk. Corrie then made several attempts to reach out to [facility] to speak to someone regarding Dennis Macias wishes. The facility would answer the phone and transfer her call and eventually hanging up. I made several attempts of reaching out to the facility, but was unsuccessful & resulted in the same response. I eventually was able to speak to Humphrey Rolls with [facility] center she advised that she is one of the nurses that assist with Dennis Macias. She was very unclear about if and when patient received his last HD treatment but she provided that patient is seen at [facility].   I then reached out & spoke with Amy,RN from [facility]. She confirmed that Dennis Macias most recent reatment took place on 10/20/20 which he voiced he no longer wanted to get dialysis treatments. Amy states the social worker within [employer] is aware and currently working on his case. I notified Corrie, [place]. She voiced her understanding.

## 2020-10-26 NOTE — Progress Notes (Signed)
POST OPERATIVE OFFICE NOTE    CC:  F/u for surgery  HPI:  This is a 70 y.o. male who is s/p right below knee amputation on 09/08/20 by [name]. He had previously undergone right PT revascularization and subsequent TMA for right foot wound and unfortunately this was not healing so he was indicated for a right below knee amputation. He was last seen on 10/12/20 and at the time his staples and sutures were removed. I had some concern about adequate healing at that time with some erythema and eschar present on the wound so I had recommended he return for a wound check in 2 weeks.    He is here today for his follow up. He reports intermittent pain in right BKA stump. The dressing he had upon arrival was dated 4/16. He reports not frequent dressing changes. When asking him about how dialysis is going he reports that he has not gone in 2 weeks because he is tired of being stuck. He says overall he has been feeling fine. He denies any fever or chills. He is present today by himself and he is alert and oriented but he is unable to tell me certain details like where he resides and where he dialyzes.   He has history of Left BKA. He currently resides at [facility]  . Cefepime Other (See Comments)    Pt had BAD encephalopathy from Cefepime    Current Outpatient Medications  Medication Sig Dispense Refill  . Accu-Chek FastClix Lancets MISC Check blood sugar up to 7 times a week as instructed 102 each 3  . acetaminophen (TYLENOL) 325 MG tablet Take 2 tablets (650 mg total) by mouth every 6 (six) hours as needed for mild pain or moderate pain (or Fever >/= 101). 60 tablet 0  . aspirin EC 81 MG tablet Take 1 tablet (81 mg total) by mouth daily.    Marland Kitchen atorvastatin (LIPITOR) 80 MG tablet Take 1 tablet (80 mg total) by mouth at bedtime. IM program 90 tablet 3  . atorvastatin (LIPITOR) 80 MG tablet TAKE 1 TABLET BY MOUTH ONCE DAILY AT BEDTIME 90 tablet 3  . Blood  Glucose Monitoring Suppl (ACCU-CHEK GUIDE) w/Device KIT 1 each by Does not apply route daily. Check blood sugar as instructed up to 7 times a week 1 kit 0  . ciprofloxacin (CIPRO) 500 MG tablet TAKE 1 TABLET (500 MG TOTAL) BY MOUTH EVERY OTHER DAY FOR 7 DAYS. TAKE ON NON-DIALYSIS DAYS. 4 tablet 0  . clopidogrel (PLAVIX) 75 MG tablet TAKE 1 TABLET (75 MG TOTAL) BY MOUTH DAILY. 30 tablet 11  . Darbepoetin Alfa (ARANESP) 40 MCG/0.4ML SOSY injection Inject 0.4 mLs (40 mcg total) into the vein every Saturday with hemodialysis. 8.4 mL 0  . dorzolamide-timolol (COSOPT) 22.3-6.8 MG/ML ophthalmic solution INSTILL 1 DROP IN RIGHT EYE 2 TIMES A DAY FOR GLAUCOMA 10 mL 0  . finasteride (PROSCAR) 5 MG tablet TAKE 1 TABLET BY MOUTH ONCE DAILY FOR BPH 30 tablet 0  . glucose blood (ACCU-CHEK GUIDE) test strip Check blood sugar up to 7 times a week as instructed 100 each 3  . lidocaine-prilocaine (EMLA) cream Apply 1 application topically as needed.    . pantoprazole (PROTONIX) 40 MG tablet Take 1 tablet (40 mg total) by mouth daily. 30 tablet 2  . polyethylene glycol (MIRALAX / GLYCOLAX) 17 g packet Take 17 g by mouth daily as needed for moderate constipation.     Marland Kitchen  sertraline (ZOLOFT) 25 MG tablet Take 25 mg by mouth daily.    . sevelamer carbonate (RENVELA) 800 MG tablet Take 800 mg by mouth See admin instructions. Take 800 mg by mouth three times a day with food on Sun/Mon/Wed/Fri and two times a day on Tues/Thurs/Sat    . sevelamer carbonate (RENVELA) 800 MG tablet TAKE 1 TAB BY MOUTH 2TIMES A DAY TUESDAY, THURSDAY & SATURDAY, 1TAB 3TIMES A DAY MONDAY, WEDNESDAY,FRIDAY & SUNDAY FOR BINDER RELATED TO RENAL DIALYSIS 90 tablet 0  . vitamin B-12 (CYANOCOBALAMIN) 500 MCG tablet Take 500 mcg by mouth daily.     No current facility-administered medications for this visit.     ROS:  See HPI  Physical Exam:  Vitals:   10/26/20 1341  BP: (!) 162/77  Pulse: 64  Resp: 20  Temp: 98 F (36.7 C)  TempSrc: Temporal   SpO2: 100%   General: chronically ill appearing, not in any distress Cardiac: regular rate and rhythm Lungs: non labored Incision:  Left BKA well healed. Right bka as appears below. Medial aspect intact and well appearing. Lateral incision line dehisced with necrotic tissue, large eschar and some purulent and foul smelling drainage     Extremities:  2+ femoral pulses bilaterally Neuro: alert and oriented Abdomen:  Flat, soft, non tender  Assessment/Plan:  This is a 70 y.o. male who is s/p right below knee amputation on 09/08/20 by [name]. Patient presents with dehiscence of right BKA. He additionally explains that he has refused dialysis for the past 2 weeks. I discussed with him what this means and asked him if this is a decision he understands could lead to his death and he expressed understanding. I discussed that I would recommend a revision of his right BKA stump which may require a revision to an above knee amputation and he stated that was fine if necessary. I then explained that if he does not wish to continue dialysis that essentially there is no reason to do any surgery on him at all. I asked if there was any family member or significant other he wished for me to discuss this with or talk to and he said no. I again reiterated that if he wishes to continue to not dialyze that it will ultimately result in his death and that we would not recommend any surgery if that was his wish and he said yes and that he understood fully. I will reach out to his nurse at [facility] as well as his dialysis center to discuss this further with them. He does not have to have a scheduled follow up with [place] at this time but I told him should he change his mind to let us know   Karoline Caldwell, PA-C [facility] [phone]  Clinic MD: Scot Dock

## 2020-10-27 DIAGNOSIS — M6281 Muscle weakness (generalized): Secondary | ICD-10-CM | POA: Diagnosis not present

## 2020-10-27 DIAGNOSIS — Z89519 Acquired absence of unspecified leg below knee: Secondary | ICD-10-CM | POA: Diagnosis not present

## 2020-10-28 ENCOUNTER — Telehealth: Payer: Self-pay

## 2020-10-28 DIAGNOSIS — N186 End stage renal disease: Secondary | ICD-10-CM | POA: Diagnosis not present

## 2020-10-28 DIAGNOSIS — E1165 Type 2 diabetes mellitus with hyperglycemia: Secondary | ICD-10-CM | POA: Diagnosis not present

## 2020-10-28 DIAGNOSIS — I739 Peripheral vascular disease, unspecified: Secondary | ICD-10-CM | POA: Diagnosis not present

## 2020-10-28 DIAGNOSIS — Z89511 Acquired absence of right leg below knee: Secondary | ICD-10-CM | POA: Diagnosis not present

## 2020-10-28 NOTE — Telephone Encounter (Signed)
Received a call from Freda Munro at the wound care center concerning patient's BKA. Advised her that he needed a revision - but was not a candidate for surgery so long as he refused HD. He was seen in our office on 4/20 and this was explained to him. If he changes his mind, he is to let us know. Faxed over office notes to wound center.

## 2020-11-08 ENCOUNTER — Ambulatory Visit: Payer: Medicare PPO | Admitting: *Deleted

## 2020-11-08 DIAGNOSIS — E785 Hyperlipidemia, unspecified: Secondary | ICD-10-CM

## 2020-11-08 DIAGNOSIS — N186 End stage renal disease: Secondary | ICD-10-CM

## 2020-11-08 DIAGNOSIS — I10 Essential (primary) hypertension: Secondary | ICD-10-CM

## 2020-11-08 DIAGNOSIS — E11319 Type 2 diabetes mellitus with unspecified diabetic retinopathy without macular edema: Secondary | ICD-10-CM

## 2020-11-08 DIAGNOSIS — I5042 Chronic combined systolic (congestive) and diastolic (congestive) heart failure: Secondary | ICD-10-CM

## 2020-11-08 DIAGNOSIS — I2510 Atherosclerotic heart disease of native coronary artery without angina pectoris: Secondary | ICD-10-CM

## 2020-11-08 NOTE — Chronic Care Management (AMB) (Signed)
  Care Management   Note  11/08/2020 Name: Dennis Macias MRN: [id number] DOB: Nov 15, 1950  Dennis Macias is enrolled in a Managed Medicaid plan: No.  Chart reviewed; most recent notes indicate patient remains a resident at [facility] and he is refusing dialysis. Call placed to facility at [phone] and switchboard staff verified he remains a resident there.       No further follow up required: closing to CCM services as patient has been resident of SNF since 08/19/20.  Kelli Churn RN, CCM, [facility] RN Care Manager [phone]

## 2020-11-09 ENCOUNTER — Encounter: Payer: Self-pay | Admitting: Student

## 2020-11-09 NOTE — Progress Notes (Signed)
Spoke with RN, Tye Maryland, at [facility]. Mr. Zunker refused any further hemodialysis treatments as of 10/20/2020. Nurse Practitioner had an extensive discussion with him regarding risks, including ultimate progression to end of life. Mr. Messer understood risks and still wished to proceed with his decision to stop HD treatments. He has been transitioned to DNR/DNI and is now on hospice care.

## 2020-11-09 NOTE — Progress Notes (Signed)
Internal Medicine Clinic Resident  I have personally reviewed this encounter including the documentation in this note and/or discussed this patient with the care management provider. I will address any urgent items identified by the care management provider and will communicate my actions to the patient's PCP. CCM is being closed for Mr. Skillin as per last CCM note. I have reviewed the patient's CCM visit with my supervising attending, Dr Evette Doffing.  Virl Axe, MD 11/09/2020

## 2020-11-23 ENCOUNTER — Emergency Department (HOSPITAL_COMMUNITY)

## 2020-11-23 ENCOUNTER — Emergency Department (HOSPITAL_COMMUNITY)
Admission: EM | Admit: 2020-11-23 | Discharge: 2020-11-23 | Disposition: A | Discharge status: Skilled Nursing Facility | Attending: Emergency Medicine | Admitting: Emergency Medicine

## 2020-11-23 DIAGNOSIS — I5043 Acute on chronic combined systolic (congestive) and diastolic (congestive) heart failure: Secondary | ICD-10-CM | POA: Insufficient documentation

## 2020-11-23 DIAGNOSIS — I132 Hypertensive heart and chronic kidney disease with heart failure and with stage 5 chronic kidney disease, or end stage renal disease: Secondary | ICD-10-CM | POA: Insufficient documentation

## 2020-11-23 DIAGNOSIS — N186 End stage renal disease: Secondary | ICD-10-CM | POA: Insufficient documentation

## 2020-11-23 DIAGNOSIS — E1122 Type 2 diabetes mellitus with diabetic chronic kidney disease: Secondary | ICD-10-CM | POA: Diagnosis not present

## 2020-11-23 DIAGNOSIS — R042 Hemoptysis: Secondary | ICD-10-CM | POA: Diagnosis not present

## 2020-11-23 DIAGNOSIS — Z7902 Long term (current) use of antithrombotics/antiplatelets: Secondary | ICD-10-CM | POA: Insufficient documentation

## 2020-11-23 DIAGNOSIS — Z7982 Long term (current) use of aspirin: Secondary | ICD-10-CM | POA: Insufficient documentation

## 2020-11-23 DIAGNOSIS — F0390 Unspecified dementia without behavioral disturbance: Secondary | ICD-10-CM | POA: Insufficient documentation

## 2020-11-23 DIAGNOSIS — Z20822 Contact with and (suspected) exposure to covid-19: Secondary | ICD-10-CM | POA: Insufficient documentation

## 2020-11-23 DIAGNOSIS — Z87891 Personal history of nicotine dependence: Secondary | ICD-10-CM | POA: Diagnosis not present

## 2020-11-23 LAB — RESP PANEL BY RT-PCR (FLU A&B, COVID) ARPGX2
Influenza A by PCR: NEGATIVE
Influenza B by PCR: NEGATIVE
SARS Coronavirus 2 by RT PCR: NEGATIVE

## 2020-11-23 LAB — COMPREHENSIVE METABOLIC PANEL
ALT: 17 U/L (ref 0–44)
AST: 18 U/L (ref 15–41)
Albumin: 3.4 g/dL — ABNORMAL LOW (ref 3.5–5.0)
Alkaline Phosphatase: 56 U/L (ref 38–126)
Anion gap: 21 — ABNORMAL HIGH (ref 5–15)
BUN: 153 mg/dL — ABNORMAL HIGH (ref 8–23)
CO2: 10 mmol/L — ABNORMAL LOW (ref 22–32)
Calcium: 10.1 mg/dL (ref 8.9–10.3)
Chloride: 121 mmol/L — ABNORMAL HIGH (ref 98–111)
Creatinine, Ser: 10.13 mg/dL — ABNORMAL HIGH (ref 0.61–1.24)
GFR, Estimated: 5 mL/min — ABNORMAL LOW (ref >60)
Glucose, Bld: 183 mg/dL — ABNORMAL HIGH (ref 70–99)
Potassium: 4.9 mmol/L (ref 3.5–5.1)
Sodium: 152 mmol/L — ABNORMAL HIGH (ref 135–145)
Total Bilirubin: 0.7 mg/dL (ref 0.3–1.2)
Total Protein: 7.2 g/dL (ref 6.5–8.1)

## 2020-11-23 LAB — CBC WITH DIFFERENTIAL/PLATELET
Abs Immature Granulocytes: 0.06 10*3/uL (ref 0.00–0.07)
Basophils Absolute: 0 10*3/uL (ref 0.0–0.1)
Basophils Relative: 0 %
Eosinophils Absolute: 0 10*3/uL (ref 0.0–0.5)
Eosinophils Relative: 0 %
HCT: 40.9 % (ref 39.0–52.0)
Hemoglobin: 12.1 g/dL — ABNORMAL LOW (ref 13.0–17.0)
Immature Granulocytes: 0 %
Lymphocytes Relative: 5 %
Lymphs Abs: 0.7 10*3/uL (ref 0.7–4.0)
MCH: 27.5 pg (ref 26.0–34.0)
MCHC: 29.6 g/dL — ABNORMAL LOW (ref 30.0–36.0)
MCV: 93 fL (ref 80.0–100.0)
Monocytes Absolute: 0.3 10*3/uL (ref 0.1–1.0)
Monocytes Relative: 2 %
Neutro Abs: 13.2 10*3/uL — ABNORMAL HIGH (ref 1.7–7.7)
Neutrophils Relative %: 93 %
Platelets: 200 10*3/uL (ref 150–400)
RBC: 4.4 MIL/uL (ref 4.22–5.81)
RDW: 17.8 % — ABNORMAL HIGH (ref 11.5–15.5)
WBC: 14.3 10*3/uL — ABNORMAL HIGH (ref 4.0–10.5)
nRBC: 0 % (ref 0.0–0.2)

## 2020-11-23 LAB — TYPE AND SCREEN
ABO/RH(D): AB POS
Antibody Screen: NEGATIVE

## 2020-11-23 LAB — POC OCCULT BLOOD, ED: Fecal Occult Bld: POSITIVE — AB

## 2020-11-23 MED ORDER — ONDANSETRON HCL 4 MG/2ML IJ SOLN
4.0000 mg | Freq: Once | INTRAMUSCULAR | Status: AC
  Administered 2020-11-23: 4 mg via INTRAVENOUS
  Filled 2020-11-23: qty 2

## 2020-11-23 MED ORDER — PANTOPRAZOLE SODIUM 40 MG IV SOLR
40.0000 mg | Freq: Once | INTRAVENOUS | Status: AC
  Administered 2020-11-23: 40 mg via INTRAVENOUS
  Filled 2020-11-23: qty 40

## 2020-11-23 NOTE — ED Notes (Signed)
ptar called 

## 2020-11-23 NOTE — ED Notes (Signed)
Patient verbalizes understanding of discharge instructions. Opportunity for questioning and answers were provided. Armband removed by staff, pt discharged from ED via PTAR to return to facility.

## 2020-11-23 NOTE — ED Provider Notes (Signed)
[facility] EMERGENCY DEPARTMENT Provider Note   CSN: [id number] Arrival date & time: 11/23/20  0208     History Chief Complaint  Patient presents with  . Hemoptysis    Dennis Macias is a 69 y.o. male.  Patient comes from nursing facility after 2 episodes of hemoptysis.  Happened about an hour ago.  Patient with dementia.  Denies any pain.  History limited due to dementia.  Per EMS has history of esophagitis.  Patient is DNR.  Overall level 5 caveat due to dementia.  The history is provided by the patient.  Illness Severity:  Mild Onset quality:  Sudden Progression:  Resolved Chronicity:  New Relieved by:  Nothing Worsened by:  NOTHING      Past Medical History:  Diagnosis Date  . Anemia   . Arthritis    past hx   . Blindness    right eye r/t diabetes per wife [name]  . Cardiorenal syndrome   . Cataract    removed both eyes  . CHF (congestive heart failure) (HCC)    hx  . CKD (chronic kidney disease)    Dialysis T Th Sat  . Dehydration   . Diabetes (HCC)    type 2 - diet controlled, no meds  . Glaucoma   . History of CVA (cerebrovascular accident) 09/13/2015  . History of stroke 09/13/2015  . History of urinary retention   . HOH (hard of hearing)    no hearing aids  . Hyperlipidemia   . Hypertension   . NSTEMI (non-ST elevated myocardial infarction) (HCC)   . Peripheral vascular disease (HCC)   . Pernicious anemia 02/24/2018  . S/P TURP   . Stroke (HCC)    2017- March, no deficit   . Syncope 11/2019  . Tachycardia 08/26/2017  . Tubular adenoma of colon 02/2017  . Walker as ambulation aid    and occasional uses cane  . Weight loss, non-intentional 08/26/2017   10 lbs between 6/18 & 2/19    Patient Active Problem List   Diagnosis Date Noted  . Fever   . Nonerosive esophageal reflux disease 08/02/2020  . Hiatal hernia with GERD and esophagitis 08/02/2020  . Gastroesophageal reflux disease with esophagitis   . Dark stools   . Stroke (HCC)  07/29/2020  . Syncope 07/28/2020  . Acute cystitis 05/03/2020  . S/P BKA (below knee amputation) (HCC) 03/31/2020  . PAD (peripheral artery disease) (HCC) 02/18/2020  . Orthostatic hypotension 02/18/2020  . Acute cerebrovascular accident (CVA) due to ischemia (HCC) 02/17/2020  . ESRD (end stage renal disease) (HCC)   . Goals of care, counseling/discussion   . Acute on chronic combined systolic and diastolic CHF (congestive heart failure) (HCC) 12/24/2019  . Non-Obstructive CAD 12/14/2019  . BPH (benign prostatic hyperplasia) 12/08/2019  . Anemia of chronic disease   . Protein-calorie malnutrition, severe 11/19/2019  . MSSA bacteremia   . Gait disorder   . Chronic combined systolic and diastolic heart failure (HCC)   . Stenosis of cervical spine with myelopathy (HCC) 11/02/2019  . Other symptoms and signs involving the musculoskeletal system 09/09/2019  . Leg weakness, bilateral 08/25/2019  . Paraparesis (HCC) 08/25/2019  . Left renal mass 07/21/2019  . CKD (chronic kidney disease) stage 4, GFR 15-29 ml/min (HCC) 04/23/2019  . Weight loss, non-intentional 08/26/2017  . 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 Anemia 12/21/2016  . Hyperlipidemia 09/13/2015  . Diabetes mellitus with retinopathy of both eyes (HCC)   . Essential hypertension       Past Surgical History:  Procedure Laterality Date  . ABDOMINAL AORTOGRAM W/LOWER EXTREMITY N/A 02/09/2020   Procedure: ABDOMINAL AORTOGRAM W/LOWER EXTREMITY;  Surgeon: Serafina Mitchell, MD;  Location: [place] [facility];  Service: Cardiovascular;  Laterality: N/A;  . ABDOMINAL AORTOGRAM W/LOWER EXTREMITY N/A 05/10/2020   Procedure: ABDOMINAL AORTOGRAM W/LOWER EXTREMITY;  Surgeon: Serafina Mitchell, MD;  Location: [place] [facility];  Service: Cardiovascular;  Laterality: N/A;  . ABDOMINAL AORTOGRAM W/LOWER EXTREMITY N/A 08/05/2020   Procedure: ABDOMINAL AORTOGRAM W/LOWER EXTREMITY;  Surgeon: Angelia Mould, MD;  Location: [place] [facility];   Service: Cardiovascular;  Laterality: N/A;  . AMPUTATION Left 03/31/2020   Procedure: LEFT BELOW KNEE AMPUTATION;  Surgeon: Serafina Mitchell, MD;  Location: [facility];  Service: Vascular;  Laterality: Left;  MAC anesthesia with regional block   WILL NEED DIALYSIS THURSDAY  . AMPUTATION Right 09/08/2020   Procedure: RIGHT AMPUTATION BELOW KNEE;  Surgeon: Cherre Robins, MD;  Location: [place];  Service: Vascular;  Laterality: Right;  . AV FISTULA PLACEMENT Right 01/19/2020   Procedure: BRACHIOCEPHALIC ARTERIOVENOUS (AV) FISTULA CREATION;  Surgeon: Waynetta Sandy, MD;  Location: [place];  Service: Vascular;  Laterality: Right;  . BELOW KNEE LEG AMPUTATION Right 09/08/2020  . BUBBLE STUDY  08/08/2020   Procedure: BUBBLE STUDY;  Surgeon: Elouise Munroe, MD;  Location: [facility];  Service: Cardiology;;  . CATARACT EXTRACTION, BILATERAL    . COLONOSCOPY    . ESOPHAGOGASTRODUODENOSCOPY (EGD) WITH PROPOFOL N/A 08/02/2020   Procedure: ESOPHAGOGASTRODUODENOSCOPY (EGD) WITH PROPOFOL;  Surgeon: Irene Shipper, MD;  Location: [facility];  Service: Endoscopy;  Laterality: N/A;  . IR FLUORO GUIDE CV LINE RIGHT  01/14/2020  . IR THORACENTESIS ASP PLEURAL SPACE W/IMG GUIDE  09/21/2019  . IR THORACENTESIS ASP PLEURAL SPACE W/IMG GUIDE  10/16/2019  . IR US GUIDE VASC ACCESS RIGHT  01/15/2020  . LEFT HEART CATH AND CORONARY ANGIOGRAPHY N/A 11/19/2019   Procedure: LEFT HEART CATH AND CORONARY ANGIOGRAPHY;  Surgeon: Jettie Booze, MD;  Location: [place] [facility];  Service: Cardiovascular;  Laterality: N/A;  . PERIPHERAL VASCULAR BALLOON ANGIOPLASTY Right 05/10/2020   Procedure: PERIPHERAL VASCULAR BALLOON ANGIOPLASTY;  Surgeon: Serafina Mitchell, MD;  Location: [place] [facility];  Service: Cardiovascular;  Laterality: Right;  PT  . PERIPHERAL VASCULAR INTERVENTION Left 02/09/2020   Procedure: PERIPHERAL VASCULAR INTERVENTION;  Surgeon: Serafina Mitchell, MD;  Location: [place] [facility];  Service:  Cardiovascular;  Laterality: Left;  . PERIPHERAL VASCULAR INTERVENTION Right 08/05/2020   Procedure: PERIPHERAL VASCULAR INTERVENTION;  Surgeon: Angelia Mould, MD;  Location: [place] [facility];  Service: Cardiovascular;  Laterality: Right;  . POLYPECTOMY    . REFRACTIVE SURGERY  10/2017  . TEE WITHOUT CARDIOVERSION N/A 09/14/2015   Procedure: TRANSESOPHAGEAL ECHOCARDIOGRAM (TEE);  Surgeon: Larey Dresser, MD;  Location: [place];  Service: Cardiovascular;  Laterality: N/A;  . TEE WITHOUT CARDIOVERSION N/A 08/08/2020   Procedure: TRANSESOPHAGEAL ECHOCARDIOGRAM (TEE);  Surgeon: Elouise Munroe, MD;  Location: [place];  Service: Cardiology;  Laterality: N/A;  . TRANSMETATARSAL AMPUTATION Right 08/11/2020   Procedure: RIGHT TRANSMETATARSAL AMPUTATION;  Surgeon: Waynetta Sandy, MD;  Location: [place];  Service: Vascular;  Laterality: Right;  . TRANSURETHRAL RESECTION OF PROSTATE N/A 10/20/2019   Procedure: TRANSURETHRAL RESECTION OF THE PROSTATE (TURP);  Surgeon: Irine Seal, MD;  Location: [facility];  Service: Urology;  Laterality: N/A;  . UPPER GASTROINTESTINAL ENDOSCOPY  Family History  Problem Relation Age of Onset  . Hypertension Mother   . Hyperlipidemia Mother   . Hyperlipidemia Father   . Colon cancer Neg Hx   . Colon polyps Neg Hx   . Esophageal cancer Neg Hx   . Rectal cancer Neg Hx   . Stomach cancer Neg Hx     Social History   Tobacco Use  . Smoking status: Former Smoker    Types: Cigarettes  . Smokeless tobacco: Former Systems developer    Types: Chew    Quit date: 07/09/1978  . Tobacco comment: quit yrs ago  Vaping Use  . Vaping Use: Never used  Substance Use Topics  . Alcohol use: Not Currently    Alcohol/week: 1.0 standard drink    Types: 1 Cans of beer per week    Comment: quit last march/2017  . Drug use: No    Home Medications Prior to Admission medications   Medication Sig Start Date End Date Taking? Authorizing Provider  acetaminophen  (TYLENOL) 325 MG tablet Take 2 tablets (650 mg total) by mouth every 6 (six) hours as needed for mild pain or moderate pain (or Fever >/= 101). 09/13/20  Yes Jeralyn Bennett, MD  aspirin EC 81 MG tablet Take 1 tablet (81 mg total) by mouth daily. 10/22/19  Yes Eugenie Filler, MD  clopidogrel (PLAVIX) 75 MG tablet TAKE 1 TABLET (75 MG TOTAL) BY MOUTH DAILY. Patient taking differently: Take 75 mg by mouth daily. 05/10/20 05/10/21 Yes Serafina Mitchell, MD  dorzolamide-timolol (COSOPT) 22.3-6.8 MG/ML ophthalmic solution INSTILL 1 DROP IN RIGHT EYE 2 TIMES A DAY FOR GLAUCOMA Patient taking differently: Place 1 drop into the right eye 2 (two) times daily. 04/26/20 04/26/21 Yes Blattenberger, [place], FNP  pantoprazole (PROTONIX) 40 MG tablet Take 1 tablet (40 mg total) by mouth daily. 08/19/20 11/17/20 Yes Iona Beard, MD  sertraline (ZOLOFT) 25 MG tablet Take 25 mg by mouth daily.   Yes [provider]  tamsulosin (FLOMAX) 0.4 MG CAPS capsule Take 0.4 mg by mouth daily. 10/31/20  Yes [provider]  Accu-Chek FastClix Lancets MISC Check blood sugar up to 7 times a week as instructed Patient not taking: Reported on 11/23/2020 12/03/19   Angiulli, Lavon Paganini, PA-C  Blood Glucose Monitoring Suppl (ACCU-CHEK GUIDE) w/Device KIT 1 each by Does not apply route daily. Check blood sugar as instructed up to 7 times a week Patient not taking: Reported on 11/23/2020 12/03/19   Angiulli, Lavon Paganini, PA-C  Darbepoetin Alfa (ARANESP) 40 MCG/0.4ML SOSY injection Inject 0.4 mLs (40 mcg total) into the vein every Saturday with hemodialysis. 08/20/20   Iona Beard, MD  finasteride (PROSCAR) 5 MG tablet TAKE 1 TABLET BY MOUTH ONCE DAILY FOR BPH Patient not taking: Reported on 11/23/2020 04/22/20 04/22/21  Blattenberger, [place], FNP  glucose blood (ACCU-CHEK GUIDE) test strip Check blood sugar up to 7 times a week as instructed 12/03/19   Angiulli, Lavon Paganini, PA-C  lidocaine-prilocaine (EMLA) cream Apply 1  application topically as needed.    [provider]  Nutritional Supplements (FEEDING SUPPLEMENT, NEPRO CARB STEADY,) LIQD Take 237 mLs by mouth in the morning and at bedtime. Patient not taking: Reported on 11/23/2020    [provider]  sevelamer carbonate (RENVELA) 800 MG tablet TAKE 1 TAB BY MOUTH 2TIMES A DAY TUESDAY, THURSDAY & SATURDAY, 1TAB 3TIMES A DAY MONDAY, [place] TO RENAL DIALYSIS Patient not taking: Reported on 11/23/2020 04/22/20 04/22/21  Blattenberger, [place], [place]  Allergies    Cefepime  Review of Systems   Review of Systems  Unable to perform ROS: Dementia    Physical Exam Updated Vital Signs BP (!) 203/76   Pulse (!) 101   Temp 98.2 F (36.8 C) (Oral)   Resp (!) 23   SpO2 99%   Physical Exam Vitals and nursing note reviewed.  Constitutional:      General: He is not in acute distress.    Appearance: He is well-developed. He is not ill-appearing.  HENT:     Head: Normocephalic and atraumatic.     Nose: Nose normal.     Mouth/Throat:     Mouth: Mucous membranes are moist.  Eyes:     Extraocular Movements: Extraocular movements intact.     Conjunctiva/sclera: Conjunctivae normal.     Pupils: Pupils are equal, round, and reactive to light.  Cardiovascular:     Rate and Rhythm: Normal rate and regular rhythm.     Pulses: Normal pulses.     Heart sounds: Normal heart sounds. No murmur heard.   Pulmonary:     Effort: Pulmonary effort is normal. No respiratory distress.     Breath sounds: Normal breath sounds.  Abdominal:     Palpations: Abdomen is soft.     Tenderness: There is no abdominal tenderness.  Musculoskeletal:     Cervical back: Normal range of motion and neck supple.  Skin:    General: Skin is warm and dry.     Capillary Refill: Capillary refill takes less than 2 seconds.  Neurological:     General: No focal deficit present.     Mental Status: He is alert.     ED Results /  Procedures / Treatments   Labs (all labs ordered are listed, but only abnormal results are displayed) Labs Reviewed  CBC WITH DIFFERENTIAL/PLATELET - Abnormal; Notable for the following components:      Result Value   WBC 14.3 (*)    Hemoglobin 12.1 (*)    MCHC 29.6 (*)    RDW 17.8 (*)    Neutro Abs 13.2 (*)    All other components within normal limits  COMPREHENSIVE METABOLIC PANEL - Abnormal; Notable for the following components:   Sodium 152 (*)    Chloride 121 (*)    CO2 10 (*)    Glucose, Bld 183 (*)    BUN 153 (*)    Creatinine, Ser 10.13 (*)    Albumin 3.4 (*)    GFR, Estimated 5 (*)    Anion gap 21 (*)    All other components within normal limits  POC OCCULT BLOOD, ED - Abnormal; Notable for the following components:   Fecal Occult Bld POSITIVE (*)    All other components within normal limits  RESP PANEL BY RT-PCR (FLU A&B, COVID) [id number]  TYPE AND SCREEN    EKG EKG Interpretation  Date/Time:  Wednesday Nov 23 2020 02:51:56 EDT Ventricular Rate:  94 PR Interval:  194 QRS Duration: 148 QT Interval:  428 QTC Calculation: 536 R Axis:   -85 Text Interpretation: Sinus rhythm Consider left atrial enlargement RBBB and LAFB Left ventricular hypertrophy Inferior infarct, old Lateral leads are also involved Confirmed by ,  ([id number]) on 11/23/2020 2:56:02 AM   Radiology DG Chest Portable 1 View  Result Date: 11/23/2020 CLINICAL DATA:  cough EXAM: PORTABLE CHEST 1 VIEW COMPARISON:  August 25, 2020. FINDINGS: The heart size and mediastinal contours are within normal limits. Aortic atherosclerosis. Both lungs are clear. No   visible pleural effusion or pneumothorax. Thoracic spondylosis. IMPRESSION: 1. No acute process in the chest. 2. Aortic atherosclerosis.  Aortic Atherosclerosis (ICD10-I70.0). Electronically Signed   By: Dahlia Bailiff MD   On: 11/23/2020 03:09    Procedures Procedures   Medications Ordered in ED Medications  ondansetron [facility]) injection 4  mg (4 mg Intravenous Given 11/23/20 0348)  pantoprazole (PROTONIX) injection 40 mg (40 mg Intravenous Given 11/23/20 0345)    ED Course  I have reviewed the triage vital signs and the nursing notes.  Pertinent labs & imaging results that were available during my care of the patient were reviewed by me and considered in my medical decision making (see chart for details).    MDM Rules/Calculators/A&P                          Tevan Marian is here with hemoptysis.  Patient with a history of end-stage renal disease on hemodialysis, hypertension, high cholesterol, esophagitis.  Patient hypertensive but otherwise normal vitals.  Dementia history and unable to provide much history.  He overall appears to be in no distress.  Has no complaints.  There is no obvious abdominal discomfort on exam.  He has no obvious blood within his oral cavity.  There is some dark type material in his depends.  This was Hemoccult positive.  Has a history of nonerosive esophagitis that was seen on EGD several months ago.  Overall suspect that this likely could be the cause again today.  No history of liver disease.  He is on Plavix.  We will get basic labs.  We will give a dose of IV Protonix.  Will likely need to admit for observation to trend hemoglobin and symptoms.  Overall hemoglobin is 12.1.  COVID test negative.  Patient with sodium of 152, bicarb of 10, creatinine of 10, normal potassium.  After review of chart it appears the patient is on hospice.  Contacted family member was able to talk with his daughter who confirms that patient is hospice and they do not want to pursue any aggressive treatments.  Patient has stopped dialysis and hospice is underway of taking him into their facility but he has not gone there yet.  She wished that patient had not gone to the ED as they do not want any further blood work or needle sticks or any other procedures done at this time.  Overall suspect he had some esophagitis.  Discharged back to  facility.  Hospice in place.  This chart was dictated using voice recognition software.  Despite best efforts to proofread,  errors can occur which can change the documentation meaning.    Final Clinical Impression(s) / ED Diagnoses Final diagnoses:  Hemoptysis    Rx / DC Orders ED Discharge Orders    None       Lennice Sites, DO 11/23/20 0507

## 2020-11-23 NOTE — ED Triage Notes (Signed)
Pt bib EMS from [facility] due to pt vomiting dark red blood x2. Vomiting started approximately an hour ago. Facility has concerns for GI bleed.  Dialysis pt Tuesday, Thursday, Saturday Hx of dementia. Resides in memory care unit at facility.  Vitals: BP: 140/80 HR: 94 RR: 18 O2: 98% CBG: 140

## 2020-11-23 NOTE — ED Notes (Signed)
Report given to Antony Blackbird, LPN of [facility]

## 2020-11-23 NOTE — ED Notes (Signed)
Pt incontinent of urine, brief changed, pt repositioned in bed. Pt alert. Awaiting [facility] for transport back to facility.

## 2020-11-23 NOTE — Discharge Instructions (Signed)
Family wishes that patient undergo full hospice.  They do not want any aggressive treatments or transfers to the hospital at this time.  Please continue to make sure to involve hospice in his care prior to any decision made to bring him to the emergency department.  Or please contact family about what to do.

## 2020-12-07 DEATH — deceased

## 2020-12-13 ENCOUNTER — Encounter (HOSPITAL_COMMUNITY): Payer: Self-pay

## 2021-01-10 ENCOUNTER — Encounter: Payer: Self-pay | Admitting: *Deleted

## 2021-03-21 ENCOUNTER — Encounter: Payer: Self-pay | Admitting: Gastroenterology

## 2021-07-13 IMAGING — DX DG CHEST 2V
2 series · 2 of 2 positions shown · non-contrast
Comparison: Degenerate 2720.2

CLINICAL DATA: Chest pain in a 69-year-old male

EXAM:
CHEST - 2 VIEW

[chest lat]
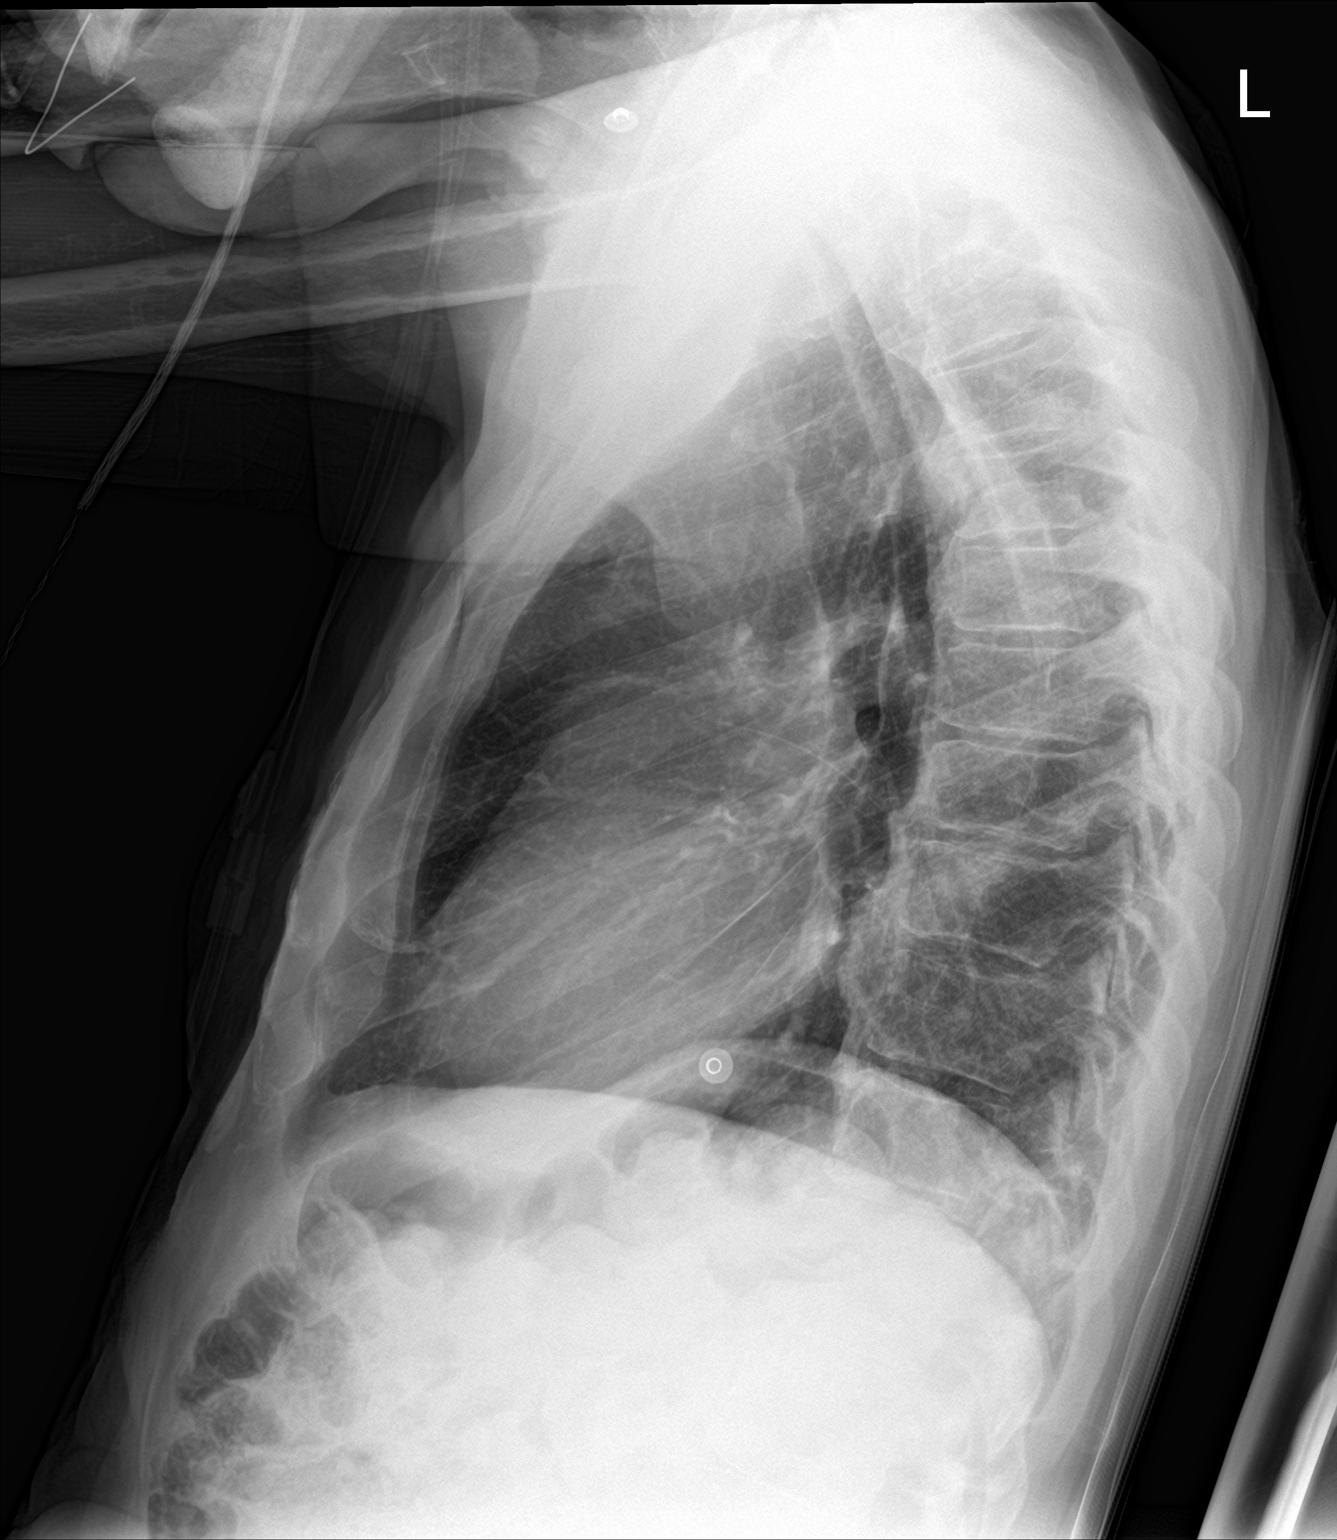

[chest ap]
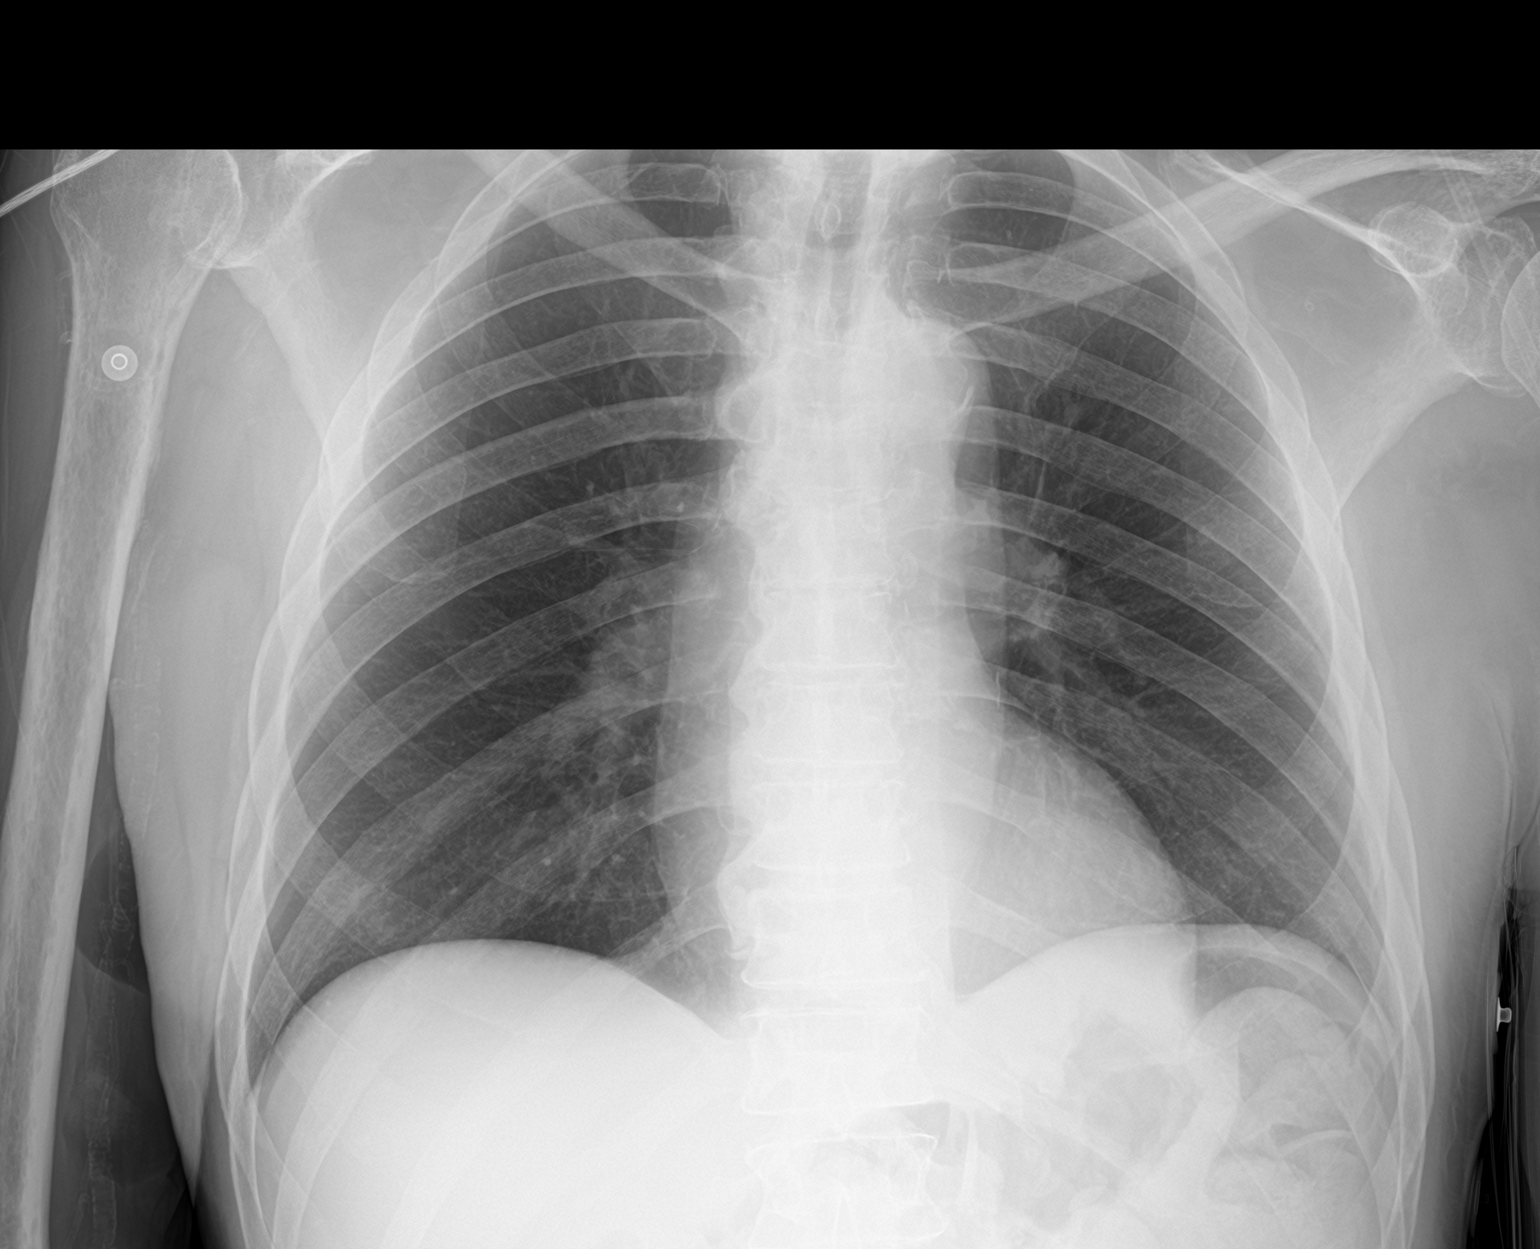

[2 of 2 positions shown; findings below may reference images not displayed]

FINDINGS: Trachea midline. Cardiomediastinal contours and hilar structures are
normal. Lungs are clear.

On limited assessment no acute skeletal process.
IMPRESSION: No active cardiopulmonary disease.

## 2023-05-28 ENCOUNTER — Other Ambulatory Visit (HOSPITAL_BASED_OUTPATIENT_CLINIC_OR_DEPARTMENT_OTHER): Payer: Self-pay
